# Patient Record
Sex: Female | Born: 1970
Health system: Southern US, Community
[De-identification: ages and names within clinical notes are randomized; demographics above are authoritative.]

## PROBLEM LIST (undated history)

## (undated) DIAGNOSIS — I82409 Acute embolism and thrombosis of unspecified deep veins of unspecified lower extremity: Secondary | ICD-10-CM

## (undated) DIAGNOSIS — K219 Gastro-esophageal reflux disease without esophagitis: Secondary | ICD-10-CM

## (undated) DIAGNOSIS — I739 Peripheral vascular disease, unspecified: Secondary | ICD-10-CM

## (undated) DIAGNOSIS — T783XXA Angioneurotic edema, initial encounter: Secondary | ICD-10-CM

## (undated) DIAGNOSIS — G629 Polyneuropathy, unspecified: Secondary | ICD-10-CM

## (undated) DIAGNOSIS — D649 Anemia, unspecified: Secondary | ICD-10-CM

## (undated) DIAGNOSIS — I2510 Atherosclerotic heart disease of native coronary artery without angina pectoris: Secondary | ICD-10-CM

## (undated) DIAGNOSIS — E119 Type 2 diabetes mellitus without complications: Secondary | ICD-10-CM

## (undated) DIAGNOSIS — I10 Essential (primary) hypertension: Secondary | ICD-10-CM

## (undated) DIAGNOSIS — G43909 Migraine, unspecified, not intractable, without status migrainosus: Secondary | ICD-10-CM

## (undated) DIAGNOSIS — K3184 Gastroparesis: Secondary | ICD-10-CM

## (undated) DIAGNOSIS — R9439 Abnormal result of other cardiovascular function study: Secondary | ICD-10-CM

## (undated) DIAGNOSIS — L732 Hidradenitis suppurativa: Secondary | ICD-10-CM

## (undated) DIAGNOSIS — H269 Unspecified cataract: Secondary | ICD-10-CM

## (undated) DIAGNOSIS — L309 Dermatitis, unspecified: Secondary | ICD-10-CM

## (undated) DIAGNOSIS — N184 Chronic kidney disease, stage 4 (severe): Secondary | ICD-10-CM

## (undated) DIAGNOSIS — L509 Urticaria, unspecified: Secondary | ICD-10-CM

## (undated) HISTORY — PX: ADENOIDECTOMY: SUR15

## (undated) HISTORY — DX: Unspecified cataract: H26.9

## (undated) HISTORY — DX: Dermatitis, unspecified: L30.9

## (undated) HISTORY — DX: Urticaria, unspecified: L50.9

## (undated) HISTORY — PX: ABDOMINOPLASTY: SUR9

## (undated) HISTORY — PX: CORONARY ANGIOPLASTY WITH STENT PLACEMENT: SHX49

## (undated) HISTORY — DX: Angioneurotic edema, initial encounter: T78.3XXA

## (undated) HISTORY — DX: Hidradenitis suppurativa: L73.2

## (undated) HISTORY — PX: TONSILLECTOMY: SUR1361

---

## 1991-01-12 HISTORY — PX: CHOLECYSTECTOMY: SHX55

## 1993-01-11 HISTORY — PX: APPENDECTOMY: SHX54

## 1994-01-11 HISTORY — PX: TUBAL LIGATION: SHX77

## 2000-01-12 HISTORY — PX: REDUCTION MAMMAPLASTY: SUR839

## 2003-09-25 ENCOUNTER — Ambulatory Visit: Payer: Self-pay | Admitting: Family Medicine

## 2003-10-09 ENCOUNTER — Ambulatory Visit: Payer: Self-pay | Admitting: Family Medicine

## 2004-07-23 ENCOUNTER — Emergency Department (HOSPITAL_COMMUNITY): Admission: EM | Admit: 2004-07-23 | Discharge: 2004-07-23 | Payer: Self-pay | Admitting: Family Medicine

## 2004-12-21 ENCOUNTER — Emergency Department (HOSPITAL_COMMUNITY): Admission: EM | Admit: 2004-12-21 | Discharge: 2004-12-21 | Payer: Self-pay | Admitting: Family Medicine

## 2005-04-22 ENCOUNTER — Encounter: Admission: RE | Admit: 2005-04-22 | Discharge: 2005-04-22 | Payer: Self-pay | Admitting: Emergency Medicine

## 2005-07-27 ENCOUNTER — Other Ambulatory Visit: Admission: RE | Admit: 2005-07-27 | Discharge: 2005-07-27 | Payer: Self-pay | Admitting: Obstetrics and Gynecology

## 2005-08-04 ENCOUNTER — Encounter (INDEPENDENT_AMBULATORY_CARE_PROVIDER_SITE_OTHER): Payer: Self-pay | Admitting: Specialist

## 2005-08-04 ENCOUNTER — Ambulatory Visit (HOSPITAL_COMMUNITY): Admission: RE | Admit: 2005-08-04 | Discharge: 2005-08-05 | Payer: Self-pay | Admitting: Obstetrics and Gynecology

## 2005-08-04 HISTORY — PX: VAGINAL HYSTERECTOMY: SUR661

## 2005-08-06 ENCOUNTER — Inpatient Hospital Stay (HOSPITAL_COMMUNITY): Admission: AD | Admit: 2005-08-06 | Discharge: 2005-08-10 | Payer: Self-pay | Admitting: Obstetrics and Gynecology

## 2005-08-13 ENCOUNTER — Inpatient Hospital Stay (HOSPITAL_COMMUNITY): Admission: AD | Admit: 2005-08-13 | Discharge: 2005-08-22 | Payer: Self-pay | Admitting: Obstetrics and Gynecology

## 2005-08-14 ENCOUNTER — Encounter (INDEPENDENT_AMBULATORY_CARE_PROVIDER_SITE_OTHER): Payer: Self-pay | Admitting: Specialist

## 2005-08-14 HISTORY — PX: EXPLORATORY LAPAROTOMY: SUR591

## 2005-08-17 ENCOUNTER — Ambulatory Visit: Payer: Self-pay | Admitting: Infectious Diseases

## 2005-10-06 ENCOUNTER — Ambulatory Visit (HOSPITAL_COMMUNITY): Admission: RE | Admit: 2005-10-06 | Discharge: 2005-10-06 | Payer: Self-pay | Admitting: Gastroenterology

## 2006-07-01 ENCOUNTER — Emergency Department (HOSPITAL_COMMUNITY): Admission: EM | Admit: 2006-07-01 | Discharge: 2006-07-01 | Payer: Self-pay | Admitting: Emergency Medicine

## 2006-07-15 ENCOUNTER — Emergency Department (HOSPITAL_COMMUNITY): Admission: EM | Admit: 2006-07-15 | Discharge: 2006-07-15 | Payer: Self-pay | Admitting: Family Medicine

## 2006-11-03 ENCOUNTER — Emergency Department (HOSPITAL_COMMUNITY): Admission: EM | Admit: 2006-11-03 | Discharge: 2006-11-03 | Payer: Self-pay | Admitting: Emergency Medicine

## 2007-02-20 ENCOUNTER — Encounter: Payer: Self-pay | Admitting: Endocrinology

## 2007-08-03 ENCOUNTER — Encounter: Admission: RE | Admit: 2007-08-03 | Discharge: 2007-08-03 | Payer: Self-pay | Admitting: Emergency Medicine

## 2007-08-27 ENCOUNTER — Emergency Department (HOSPITAL_COMMUNITY): Admission: EM | Admit: 2007-08-27 | Discharge: 2007-08-27 | Payer: Self-pay | Admitting: Family Medicine

## 2007-09-22 ENCOUNTER — Emergency Department (HOSPITAL_COMMUNITY): Admission: EM | Admit: 2007-09-22 | Discharge: 2007-09-22 | Payer: Self-pay | Admitting: Family Medicine

## 2007-11-02 ENCOUNTER — Emergency Department (HOSPITAL_COMMUNITY): Admission: EM | Admit: 2007-11-02 | Discharge: 2007-11-02 | Payer: Self-pay | Admitting: Emergency Medicine

## 2007-11-19 ENCOUNTER — Emergency Department (HOSPITAL_COMMUNITY): Admission: EM | Admit: 2007-11-19 | Discharge: 2007-11-19 | Payer: Self-pay | Admitting: Emergency Medicine

## 2007-12-23 ENCOUNTER — Emergency Department (HOSPITAL_COMMUNITY): Admission: EM | Admit: 2007-12-23 | Discharge: 2007-12-24 | Payer: Self-pay | Admitting: Emergency Medicine

## 2007-12-24 ENCOUNTER — Emergency Department (HOSPITAL_COMMUNITY): Admission: EM | Admit: 2007-12-24 | Discharge: 2007-12-24 | Payer: Self-pay | Admitting: Emergency Medicine

## 2007-12-24 ENCOUNTER — Emergency Department (HOSPITAL_COMMUNITY): Admission: EM | Admit: 2007-12-24 | Discharge: 2007-12-24 | Payer: Self-pay | Admitting: Family Medicine

## 2008-03-09 ENCOUNTER — Emergency Department (HOSPITAL_COMMUNITY): Admission: EM | Admit: 2008-03-09 | Discharge: 2008-03-10 | Payer: Self-pay | Admitting: Emergency Medicine

## 2008-07-26 ENCOUNTER — Emergency Department (HOSPITAL_COMMUNITY): Admission: EM | Admit: 2008-07-26 | Discharge: 2008-07-26 | Payer: Self-pay | Admitting: Family Medicine

## 2008-07-26 ENCOUNTER — Emergency Department (HOSPITAL_COMMUNITY): Admission: EM | Admit: 2008-07-26 | Discharge: 2008-07-26 | Payer: Self-pay | Admitting: Emergency Medicine

## 2008-10-05 ENCOUNTER — Emergency Department (HOSPITAL_COMMUNITY): Admission: EM | Admit: 2008-10-05 | Discharge: 2008-10-06 | Payer: Self-pay | Admitting: Emergency Medicine

## 2008-10-24 ENCOUNTER — Emergency Department (HOSPITAL_COMMUNITY): Admission: EM | Admit: 2008-10-24 | Discharge: 2008-10-24 | Payer: Self-pay | Admitting: Family Medicine

## 2009-01-21 ENCOUNTER — Emergency Department (HOSPITAL_COMMUNITY): Admission: EM | Admit: 2009-01-21 | Discharge: 2009-01-21 | Payer: Self-pay | Admitting: Emergency Medicine

## 2009-01-22 ENCOUNTER — Emergency Department (HOSPITAL_COMMUNITY): Admission: EM | Admit: 2009-01-22 | Discharge: 2009-01-22 | Payer: Self-pay | Admitting: Family Medicine

## 2009-03-14 ENCOUNTER — Emergency Department (HOSPITAL_COMMUNITY): Admission: EM | Admit: 2009-03-14 | Discharge: 2009-03-14 | Payer: Self-pay | Admitting: Emergency Medicine

## 2009-07-09 ENCOUNTER — Emergency Department (HOSPITAL_COMMUNITY): Admission: EM | Admit: 2009-07-09 | Discharge: 2009-07-10 | Payer: Self-pay | Admitting: Emergency Medicine

## 2009-07-22 ENCOUNTER — Emergency Department (HOSPITAL_COMMUNITY): Admission: EM | Admit: 2009-07-22 | Discharge: 2009-07-22 | Payer: Self-pay | Admitting: Family Medicine

## 2009-08-03 ENCOUNTER — Emergency Department (HOSPITAL_COMMUNITY): Admission: EM | Admit: 2009-08-03 | Discharge: 2009-08-03 | Payer: Self-pay | Admitting: Family Medicine

## 2009-08-09 ENCOUNTER — Emergency Department (HOSPITAL_COMMUNITY): Admission: EM | Admit: 2009-08-09 | Discharge: 2009-08-09 | Payer: Self-pay | Admitting: Family Medicine

## 2009-11-04 ENCOUNTER — Emergency Department (HOSPITAL_COMMUNITY): Admission: EM | Admit: 2009-11-04 | Discharge: 2009-11-04 | Payer: Self-pay | Admitting: Emergency Medicine

## 2010-01-01 ENCOUNTER — Encounter (INDEPENDENT_AMBULATORY_CARE_PROVIDER_SITE_OTHER): Payer: Self-pay | Admitting: Family Medicine

## 2010-01-01 LAB — CONVERTED CEMR LAB
ALT: 9 units/L (ref 0–35)
AST: 7 units/L (ref 0–37)
Alkaline Phosphatase: 129 units/L — ABNORMAL HIGH (ref 39–117)
BUN: 15 mg/dL (ref 6–23)
CO2: 24 meq/L (ref 19–32)
Calcium: 9.4 mg/dL (ref 8.4–10.5)
Chloride: 100 meq/L (ref 96–112)
Creatinine, Ser: 0.67 mg/dL (ref 0.40–1.20)
Glucose, Bld: 418 mg/dL — ABNORMAL HIGH (ref 70–99)
HCT: 39.2 % (ref 36.0–46.0)
HDL: 33 mg/dL — ABNORMAL LOW (ref >39)
RBC: 4.95 M/uL (ref 3.87–5.11)
RDW: 13.4 % (ref 11.5–15.5)
Total Bilirubin: 0.3 mg/dL (ref 0.3–1.2)
Total CHOL/HDL Ratio: 7.2
Triglycerides: 149 mg/dL (ref <150)

## 2010-01-09 ENCOUNTER — Emergency Department (HOSPITAL_COMMUNITY)
Admission: EM | Admit: 2010-01-09 | Discharge: 2010-01-09 | Payer: Self-pay | Source: Home / Self Care | Admitting: Emergency Medicine

## 2010-02-12 ENCOUNTER — Inpatient Hospital Stay (INDEPENDENT_AMBULATORY_CARE_PROVIDER_SITE_OTHER)
Admission: RE | Admit: 2010-02-12 | Discharge: 2010-02-12 | Disposition: A | Discharge status: Home / Self Care | Payer: Self-pay | Source: Ambulatory Visit | Attending: Family Medicine | Admitting: Family Medicine

## 2010-02-12 DIAGNOSIS — L0591 Pilonidal cyst without abscess: Secondary | ICD-10-CM

## 2010-03-06 ENCOUNTER — Emergency Department (HOSPITAL_COMMUNITY)
Admission: EM | Admit: 2010-03-06 | Discharge: 2010-03-07 | Disposition: A | Discharge status: Home / Self Care | Payer: Self-pay | Attending: Emergency Medicine | Admitting: Emergency Medicine

## 2010-03-06 DIAGNOSIS — K2970 Gastritis, unspecified, without bleeding: Secondary | ICD-10-CM | POA: Insufficient documentation

## 2010-03-06 DIAGNOSIS — E785 Hyperlipidemia, unspecified: Secondary | ICD-10-CM | POA: Insufficient documentation

## 2010-03-06 DIAGNOSIS — F17200 Nicotine dependence, unspecified, uncomplicated: Secondary | ICD-10-CM | POA: Insufficient documentation

## 2010-03-06 DIAGNOSIS — Z880 Allergy status to penicillin: Secondary | ICD-10-CM | POA: Insufficient documentation

## 2010-03-06 DIAGNOSIS — Z8249 Family history of ischemic heart disease and other diseases of the circulatory system: Secondary | ICD-10-CM | POA: Insufficient documentation

## 2010-03-06 DIAGNOSIS — E119 Type 2 diabetes mellitus without complications: Secondary | ICD-10-CM | POA: Insufficient documentation

## 2010-03-06 DIAGNOSIS — Z794 Long term (current) use of insulin: Secondary | ICD-10-CM | POA: Insufficient documentation

## 2010-03-06 DIAGNOSIS — Z79899 Other long term (current) drug therapy: Secondary | ICD-10-CM | POA: Insufficient documentation

## 2010-03-06 LAB — DIFFERENTIAL
Basophils Absolute: 0 10*3/uL (ref 0.0–0.1)
Basophils Relative: 0 % (ref 0–1)
Lymphocytes Relative: 42 % (ref 12–46)
Monocytes Absolute: 0.3 10*3/uL (ref 0.1–1.0)
Monocytes Relative: 4 % (ref 3–12)
Neutro Abs: 5.2 10*3/uL (ref 1.7–7.7)

## 2010-03-06 LAB — COMPREHENSIVE METABOLIC PANEL
ALT: 25 U/L (ref 0–35)
AST: 15 U/L (ref 0–37)
Albumin: 3.5 g/dL (ref 3.5–5.2)
GFR calc non Af Amer: >60 mL/min (ref >60)
Total Bilirubin: 0.3 mg/dL (ref 0.3–1.2)
Total Protein: 8 g/dL (ref 6.0–8.3)

## 2010-03-06 LAB — CBC
Hemoglobin: 12.8 g/dL (ref 12.0–15.0)
MCH: 25.8 pg — ABNORMAL LOW (ref 26.0–34.0)
MCHC: 31.9 g/dL (ref 30.0–36.0)
Platelets: 387 10*3/uL (ref 150–400)
RBC: 4.96 MIL/uL (ref 3.87–5.11)
RDW: 13.8 % (ref 11.5–15.5)

## 2010-03-06 LAB — POCT PREGNANCY, URINE: Preg Test, Ur: NEGATIVE

## 2010-03-23 LAB — CBC
MCH: 26.6 pg (ref 26.0–34.0)
Platelets: 291 10*3/uL (ref 150–400)
RBC: 4.82 MIL/uL (ref 3.87–5.11)

## 2010-03-23 LAB — URINE MICROSCOPIC-ADD ON

## 2010-03-23 LAB — BASIC METABOLIC PANEL
CO2: 26 mEq/L (ref 19–32)
Calcium: 9.1 mg/dL (ref 8.4–10.5)
Creatinine, Ser: 0.65 mg/dL (ref 0.4–1.2)
GFR calc Af Amer: >60 mL/min (ref >60)

## 2010-03-23 LAB — GLUCOSE, CAPILLARY
Glucose-Capillary: 247 mg/dL — ABNORMAL HIGH (ref 70–99)
Glucose-Capillary: 375 mg/dL — ABNORMAL HIGH (ref 70–99)

## 2010-03-23 LAB — URINALYSIS, ROUTINE W REFLEX MICROSCOPIC
Bilirubin Urine: NEGATIVE
Ketones, ur: NEGATIVE mg/dL
Leukocytes, UA: NEGATIVE
Protein, ur: NEGATIVE mg/dL
Specific Gravity, Urine: 1.044 — ABNORMAL HIGH (ref 1.005–1.030)

## 2010-03-23 LAB — URINE CULTURE
Colony Count: 10000
Culture  Setup Time: 201112301112

## 2010-03-23 LAB — DIFFERENTIAL
Basophils Absolute: 0 10*3/uL (ref 0.0–0.1)
Basophils Relative: 0 % (ref 0–1)
Eosinophils Absolute: 0.1 10*3/uL (ref 0.0–0.7)
Lymphs Abs: 3.3 10*3/uL (ref 0.7–4.0)
Neutrophils Relative %: 52 % (ref 43–77)

## 2010-03-28 LAB — CULTURE, ROUTINE-ABSCESS

## 2010-03-29 LAB — POCT I-STAT, CHEM 8
BUN: 14 mg/dL (ref 6–23)
Calcium, Ion: 1.23 mmol/L (ref 1.12–1.32)
Calcium, Ion: 1.16 mmol/L (ref 1.12–1.32)
Chloride: 99 mEq/L (ref 96–112)
Glucose, Bld: 504 mg/dL — ABNORMAL HIGH (ref 70–99)
Glucose, Bld: 448 mg/dL — ABNORMAL HIGH (ref 70–99)
HCT: 47 % — ABNORMAL HIGH (ref 36.0–46.0)
Hemoglobin: 16 g/dL — ABNORMAL HIGH (ref 12.0–15.0)
Potassium: 4.1 mEq/L (ref 3.5–5.1)
TCO2: 24 mmol/L (ref 0–100)

## 2010-03-29 LAB — GLUCOSE, CAPILLARY
Glucose-Capillary: 332 mg/dL — ABNORMAL HIGH (ref 70–99)
Glucose-Capillary: 356 mg/dL — ABNORMAL HIGH (ref 70–99)

## 2010-03-29 LAB — POCT URINALYSIS DIP (DEVICE)
Protein, ur: NEGATIVE mg/dL
Urobilinogen, UA: 0.2 mg/dL (ref 0.0–1.0)
pH: 5 (ref 5.0–8.0)

## 2010-04-05 LAB — CULTURE, ROUTINE-ABSCESS

## 2010-04-06 ENCOUNTER — Other Ambulatory Visit (HOSPITAL_COMMUNITY): Payer: Self-pay | Admitting: Family Medicine

## 2010-04-06 DIAGNOSIS — Z1231 Encounter for screening mammogram for malignant neoplasm of breast: Secondary | ICD-10-CM

## 2010-04-10 ENCOUNTER — Ambulatory Visit (HOSPITAL_COMMUNITY)
Admission: RE | Admit: 2010-04-10 | Discharge: 2010-04-10 | Disposition: A | Discharge status: Home / Self Care | Payer: Self-pay | Source: Ambulatory Visit | Attending: Family Medicine | Admitting: Family Medicine

## 2010-04-10 DIAGNOSIS — Z1231 Encounter for screening mammogram for malignant neoplasm of breast: Secondary | ICD-10-CM | POA: Insufficient documentation

## 2010-04-16 LAB — GC/CHLAMYDIA PROBE AMP, GENITAL: Chlamydia, DNA Probe: NEGATIVE

## 2010-04-16 LAB — WET PREP, GENITAL: WBC, Wet Prep HPF POC: NONE SEEN

## 2010-04-17 LAB — BASIC METABOLIC PANEL
BUN: 11 mg/dL (ref 6–23)
Calcium: 9.4 mg/dL (ref 8.4–10.5)
Chloride: 97 mEq/L (ref 96–112)
Creatinine, Ser: 0.58 mg/dL (ref 0.4–1.2)
GFR calc Af Amer: >60 mL/min (ref >60)
GFR calc non Af Amer: >60 mL/min (ref >60)

## 2010-04-17 LAB — URINALYSIS, ROUTINE W REFLEX MICROSCOPIC
Glucose, UA: >1000 mg/dL — AB
Hgb urine dipstick: NEGATIVE
Leukocytes, UA: NEGATIVE
Protein, ur: NEGATIVE mg/dL
Specific Gravity, Urine: 1.031 — ABNORMAL HIGH (ref 1.005–1.030)
pH: 6 (ref 5.0–8.0)

## 2010-04-17 LAB — URINE MICROSCOPIC-ADD ON

## 2010-04-17 LAB — DIFFERENTIAL
Eosinophils Absolute: 0.1 10*3/uL (ref 0.0–0.7)
Lymphocytes Relative: 27 % (ref 12–46)
Lymphs Abs: 3.4 10*3/uL (ref 0.7–4.0)
Monocytes Relative: 5 % (ref 3–12)
Neutro Abs: 8.3 10*3/uL — ABNORMAL HIGH (ref 1.7–7.7)
Neutrophils Relative %: 67 % (ref 43–77)

## 2010-04-17 LAB — GLUCOSE, CAPILLARY
Glucose-Capillary: 313 mg/dL — ABNORMAL HIGH (ref 70–99)
Glucose-Capillary: 422 mg/dL — ABNORMAL HIGH (ref 70–99)

## 2010-04-17 LAB — CBC
MCV: 80.6 fL (ref 78.0–100.0)
Platelets: 301 10*3/uL (ref 150–400)
RBC: 4.98 MIL/uL (ref 3.87–5.11)
WBC: 12.5 10*3/uL — ABNORMAL HIGH (ref 4.0–10.5)

## 2010-04-17 LAB — PREGNANCY, URINE: Preg Test, Ur: NEGATIVE

## 2010-04-19 LAB — WET PREP, GENITAL: Trich, Wet Prep: NONE SEEN

## 2010-04-19 LAB — POCT URINALYSIS DIP (DEVICE)
Ketones, ur: NEGATIVE mg/dL
Protein, ur: NEGATIVE mg/dL
Specific Gravity, Urine: 1.015 (ref 1.005–1.030)
pH: 5.5 (ref 5.0–8.0)

## 2010-04-20 ENCOUNTER — Emergency Department (HOSPITAL_COMMUNITY)
Admission: EM | Admit: 2010-04-20 | Discharge: 2010-04-21 | Disposition: A | Discharge status: Home / Self Care | Payer: Self-pay | Attending: Emergency Medicine | Admitting: Emergency Medicine

## 2010-04-20 DIAGNOSIS — Z950 Presence of cardiac pacemaker: Secondary | ICD-10-CM | POA: Insufficient documentation

## 2010-04-20 DIAGNOSIS — R079 Chest pain, unspecified: Secondary | ICD-10-CM | POA: Insufficient documentation

## 2010-04-20 DIAGNOSIS — R109 Unspecified abdominal pain: Secondary | ICD-10-CM | POA: Insufficient documentation

## 2010-04-20 DIAGNOSIS — I10 Essential (primary) hypertension: Secondary | ICD-10-CM | POA: Insufficient documentation

## 2010-04-28 LAB — CBC
HCT: 42.7 % (ref 36.0–46.0)
Platelets: 291 10*3/uL (ref 150–400)
RDW: 13.4 % (ref 11.5–15.5)

## 2010-04-28 LAB — URINALYSIS, ROUTINE W REFLEX MICROSCOPIC
Ketones, ur: NEGATIVE mg/dL
Leukocytes, UA: NEGATIVE
Nitrite: POSITIVE — AB
Protein, ur: NEGATIVE mg/dL
Urobilinogen, UA: 1 mg/dL (ref 0.0–1.0)
pH: 6.5 (ref 5.0–8.0)

## 2010-04-28 LAB — POCT I-STAT, CHEM 8
BUN: 12 mg/dL (ref 6–23)
Calcium, Ion: 1.22 mmol/L (ref 1.12–1.32)
Chloride: 98 mEq/L (ref 96–112)
Creatinine, Ser: 0.6 mg/dL (ref 0.4–1.2)
Glucose, Bld: 435 mg/dL — ABNORMAL HIGH (ref 70–99)

## 2010-04-28 LAB — DIFFERENTIAL
Basophils Absolute: 0.1 10*3/uL (ref 0.0–0.1)
Basophils Relative: 1 % (ref 0–1)
Eosinophils Relative: 1 % (ref 0–5)
Lymphocytes Relative: 29 % (ref 12–46)

## 2010-04-28 LAB — URINE CULTURE: Colony Count: >=100000

## 2010-04-28 LAB — URINE MICROSCOPIC-ADD ON

## 2010-04-28 LAB — GC/CHLAMYDIA PROBE AMP, GENITAL
Chlamydia, DNA Probe: NEGATIVE
GC Probe Amp, Genital: NEGATIVE

## 2010-04-28 LAB — KETONES, QUALITATIVE: Acetone, Bld: NEGATIVE

## 2010-04-28 LAB — GLUCOSE, CAPILLARY
Glucose-Capillary: 127 mg/dL — ABNORMAL HIGH (ref 70–99)
Glucose-Capillary: 398 mg/dL — ABNORMAL HIGH (ref 70–99)

## 2010-05-29 NOTE — Discharge Summary (Signed)
NAME:  Susan Horton, ENGEBRETSON NO.:  [id number]   MEDICAL RECORD NO.:  [id number]          PATIENT TYPE:  INP   LOCATION:  [id number]                          FACILITY:  [facility]   PHYSICIAN:  Eli Hose, M.D.DATE OF BIRTH:  1970-05-14   DATE OF ADMISSION:  08/06/2005  DATE OF DISCHARGE:  08/10/2005                                 DISCHARGE SUMMARY   DISCHARGE DIAGNOSES:  1. Ileus.  2. Status post vaginal hysterectomy.   PROCEDURES:  On hospital day #1 and #3, the patient underwent an acute  abdominal series which showed progressive small bowel distension with  multiple air-fluid levels.  The patient was thought to perhaps have a focal  ileus or small-bowel obstruction.  There was no evidence of free  intraperitoneal air and no acute chest findings.   HISTORY OF PRESENT ILLNESS:  Ms. Susan Horton is a 40 year old female, P4-0-0-4 who  was 2 days status post from a vaginal hysterectomy who presented with  increased abdominal bloating pain, constipation and vomiting.  Please see  the patient's dictated history and physical examination for details.   PHYSICAL EXAMINATION:  VITAL SIGNS:  Blood pressure was 121/86, temperature  was 98.6 degrees Fahrenheit orally.  GENERAL:  Within normal limits.  ABDOMEN:  Do note though the patient's abdominal exam revealed a distended  abdomen which was soft without rebound or guarding.  PELVIC:  The patient's pelvic exam was deferred.   HOSPITAL COURSE:  On the day of admission, the patient was given a Fleet's  enema switched to which she had a good response with relief of her symptoms  for approximately 24 hours at which time she resumed a sensation of  fullness, could no longer passed flatus and had not had a bowel movement.  The patient was then given Dulcolax suppositories along with MiraLax and a  general surgery consult was obtained.  It was felt by the consultant that  the patient had an ileus which should resolve with enemas within 24  hours.  The patient was again given an enema to which she responded by the passage  of yet another large amount of stool.  It was at this time that the  patient's symptoms resolved and remained so until her time of discharge.  The patient was able to tolerate a regular diet by hospital day #5, and was  therefore deemed ready for discharge home.  The patient's discharge complete  blood count was within normal limits except the patient's hemoglobin was  10.5 and hematocrit 31.6.  The patient's comprehensive metabolic panel at  time of discharge was also within normal limits with the exception of her  albumin being 2.8.   DISCHARGE MEDICATIONS:  1. Dulcolax suppositories as needed.  2. Prune juice daily.  3. MiraLax 17 g with 4-8 ounce glasses of liquid daily.  4. Ciprofloxacin as written on prescription.  5. Diflucan 150 mg one p.o. stat.   FOLLOW UP:  The patient is scheduled for a follow-up visit with Dr. Mancel Bale  on August 16, 2005, at 9 a.m.   DISCHARGE INSTRUCTIONS:  The patient was  advised to call doctor's office  with a temperature greater than or equal to 100.4 degrees Fahrenheit, severe  abdominal pain, constipation, nausea or vomiting.  The patient was further  advised to resume her previous discharge instructions regarding activity and  diet.      Susan Horton.      Eli Hose, M.D.  Electronically Signed    EJP/MEDQ  D:  10/08/2005  T:  10/11/2005  Job:  [id number]

## 2010-05-29 NOTE — Op Note (Signed)
NAME:  Susan Horton, Susan Horton NO.:  [id number]   MEDICAL RECORD NO.:  [id number]          PATIENT TYPE:  OIB   LOCATION:  9317                          FACILITY:  [facility]   PHYSICIAN:  Everett Graff, M.D.   DATE OF BIRTH:  07-31-1970   DATE OF PROCEDURE:  08/04/2005  DATE OF DISCHARGE:                                 OPERATIVE REPORT   PREOPERATIVE DIAGNOSES:  1.  Menorrhagia.  2.  Dysmenorrhea.  3.  Insulin dependent diabetic.  4.  Questionable adenomyosis.   POSTOPERATIVE DIAGNOSES:  1.  Menorrhagia.  2.  Dysmenorrhea.  3.  Insulin dependent diabetic.   PROCEDURE:  Total vaginal hysterectomy and cystoscopy.   ATTENDING:  Dr. Everett Graff.   ASSISTANT:  [name].   ANESTHESIA:  General.   SPECIMENS TO PATHOLOGY:  Uterus and cervix.   ESTIMATED BLOOD LOSS:  300 mL.   URINE OUTPUT:  400 mL.   FLUIDS:  2500 mL.   COMPLICATIONS:  None.   FINDINGS:  Normal right ovary, however left ovary unable to be visualized  clearly.   PROCEDURE:  The patient was taken to the operating room, after the risks,  benefits and alternatives were reviewed with the patient.  The patient  verbalized understanding, consent signed and witnessed.  The patient was  placed under general anesthesia, and prepped and draped in the normal  sterile fashion, in the dorsal lithotomy position.  A weighted speculum was  placed in the patient's vagina, and vaginal wall retractors placed as well.  The cervix was circumscribed with the Bovie, and the posterior cul-de-sac  entered without difficulty.  The uterosacrals were bilaterally clamped with  curved Heaney, cut and suture ligated using zero Vicryl.  The bladder was  dissected away and anterior cul-de-sac entered.  The paracervical and  parametrial tissue was then clamped with a curved Heaney, cut and suture  ligated using zero Vicryl, incorporating the cardinal ligament and uterine  vessels.  This was done in a sequential  fashion bilaterally.  The fundus was  exteriorized, using a towel clip, and the utero-ovarian pedicle was clamped  with a large Kelly bilaterally, cut, ligated with 0-0 Vicryl ties x2, and  suture ligated.  This was done on both pedicles.  There was some bleeding  noted on the right which was made hemostatic with 2-0 Vicryl stitches.  The  attention was then turned to the bladder, and the Foley was removed, and  cystoscopy performed, after indigo carmine administered, and bilateral  ureters noted to efflux without difficulty.  The vaginal angles were then  stitched using zero Vicryl fixation stitches, incorporating the sacral  ligament, after replacing the Foley to gravity.  The remainder of the cuff  was closed with zero Vicryl via figure-of-eight stitches.  Good hemostasis  was noted.  Sponge, lap and needle count was correct.  The patient was  returned to recovery room in good condition.      Everett Graff, M.D.  Electronically Signed     AR/MEDQ  D:  08/04/2005  T:  08/04/2005  Job:  [id number]

## 2010-05-29 NOTE — Discharge Summary (Signed)
NAME:  Susan Horton, Susan Horton NO.:  [id number]   MEDICAL RECORD NO.:  [id number]          PATIENT TYPE:  OIB   LOCATION:  [id number]                          FACILITY[facility]   PHYSICIAN:  Everett Graff, M.D.   DATE OF BIRTH:  [birth year]   DATE OF ADMISSION:  08/04/2005  DATE OF DISCHARGE:  08/05/2005                                 DISCHARGE SUMMARY   DISCHARGE DIAGNOSIS:  Menorrhagia, dysmenorrhea.   OPERATION:  On the day of admission the patient underwent a total vaginal  hysterectomy with cystoscopy tolerating procedures well.   HISTORY OF PRESENT ILLNESS:  Susan Horton is a 40 year old single African  American female who is status post bilateral tubal ligation para 4-0-0-4 who  presents for a vaginal hysterectomy because of menorrhagia and dysmenorrhea.  Please see the patient's dictated history and physical examination for  details.   PREOPERATIVE PHYSICAL EXAM:  Blood pressure 110/70, weight is 181, height is  5 feet 6-1/4 inches tall.  GENERAL: Within normal limits.  PELVIC EXAM: EGBUS was within normal limits.  Vagina is normal.  Cervix is  nontender without lesions.  The uterus is normal size, shape and consistency  without tenderness.  Adnexa without tenderness or masses.   HOSPITAL COURSE:  On the day of admission the patient underwent  aforementioned procedures tolerating them all well.  Postoperative course  was unremarkable with the patient's postop hemoglobin being 11.1 (preop  hemoglobin 12.9).  By postop day #1 the patient had resumed bowel and  bladder function and was therefore deemed ready for discharge home.   DISCHARGE MEDICATIONS:  Percocet 5/325 1-2 tablets every 4 hours as needed  for pain.  Ibuprofen 600 mg with food every 6 hours for 3 days then as  needed for pain, Colace 100 mg twice daily until bowel movements are  regular.  The patient was also referred to her home medication  reconciliation sheet.   FOLLOW UP:  The patient has a 6 weeks  postoperative visit with Dr. Mancel Bale  on August 30 of 2007 at 2:30 p.m.   DISCHARGE INSTRUCTIONS:  The patient was given a copy of [facility] postoperative instruction sheet.  She was further advised to avoid  driving for 2 weeks, heavy lifting for 4 weeks, intercourse for 6 weeks,  that she should increase her activities slowly, that she may shower and walk  up steps.  The patient's diet is restricted to a diabetic diet.  Final  pathology is not available in the time of this dictation.      [name].      Everett Graff, M.D.  Electronically Signed    EJP/MEDQ  D:  09/25/2005  T:  09/27/2005  Job:  [id number]

## 2010-05-29 NOTE — H&P (Signed)
NAME:  Susan Horton, Susan Horton NO.:  [id number]   MEDICAL RECORD NO.:  [id number]          PATIENT TYPE:  MAT   LOCATION:  MATC                          FACILITY:  [facility]   PHYSICIAN:  Eli Hose, M.D.DATE OF BIRTH:  Jan 25, 1970   DATE OF ADMISSION:  08/06/2005  DATE OF DISCHARGE:                                HISTORY & PHYSICAL   This is a 40 year old, gravida 4 para 4, who is 2 days post-op from a  vaginal hysterectomy, who presents with increased abdominal bloating and  pain with also having vomiting and constipation.  She had a vaginal  hysterectomy on August 04, 2005, and has not had a bowel movement since.  She  had a Dulcolax suppository yesterday and magnesium citrate today with no  results and reports increased bloating and pain.   ALLERGIES:  PENICILLIN.   OBSTETRICAL HISTORY:  Vaginal delivery x4.   MEDICAL HISTORY:  Insulin-dependent diabetes.   SURGICAL HISTORY:  1.  Breast reduction.  2.  Tonsillectomy.  3.  Appendectomy.  4.  Recent vaginal hysterectomy.   SOCIAL HISTORY:  Family is supportive.  She does smoke 1/2 a pack per day of  cigarettes but denies alcohol or drug abuse.   FAMILY HISTORY:  Noncontributory.   GENETIC HISTORY:  Noncontributory.   REVIEW OF SYSTEMS:  The patient complains of abdominal bloating and pain.   OBJECTIVE DATA:  VITAL SIGNS:  Stable 98.6, blood pressure 121/86.  HEENT:  Within normal limits.  Thyroid not enlarged.  CHEST:  Clear to auscultation.  HEART:  Regular rate and rhythm.  ABDOMEN:  Distended but soft with no rebound or guarding.  PELVIC:  Deferred.  EXTREMITIES:  Within normal limits.   LABORATORY VALUES:  White blood cell count 11.8, hemoglobin 12.6, platelets  392.  Chemistries within normal limits except for a glucose of 140.  AST is  12, ALT is 16.  Bilirubin is 0.9, creatinine is 0.7.  KUB shows a mild  ileus.   ASSESSMENT:  1.  Status post 2 days ago vaginal hysterectomy.  2.  Mild  ileus.   PLAN:  1.  Admit to third floor per Dr. Raphael Gibney.  2.  IV fluids.  3.  Enema.  4.  Further orders to follow.      Marie L. Williams, C.N.M.      Eli Hose, M.D.  Electronically Signed    MLW/MEDQ  D:  08/06/2005  T:  08/06/2005  Job:  [id number]

## 2010-05-29 NOTE — Consult Note (Signed)
NAME:  Susan Horton, Susan Horton NO.:  [id number]   MEDICAL RECORD NO.:  [id number]          PATIENT TYPE:  INP   LOCATION:  9319                          FACILITY:  [facility]   PHYSICIAN:  Lear Ng, MDDATE OF BIRTH:  1970-09-27   DATE OF CONSULTATION:  08/17/2005  DATE OF DISCHARGE:                                   CONSULTATION   INDICATIONS FOR PROCEDURE:  Vomiting, small bowel obstruction.   HISTORY OF PRESENT ILLNESS:  Susan Horton is a 40 year old black female who had  a hysterectomy on August 04, 2005, and was discharged home and was readmitted  secondary to nausea, vomiting, and abdominal pain postoperatively.  During  her readmission, she had a CAT scan done which showed a pelvic abscess and  small bowel obstruction.  She had surgery on August 14, 2005, in the form of  exploratory laparotomy, lysis of adhesions, and drainage of tubal ovarian  abscess.  Postoperatively, she has been managed with broad spectrum  antibiotics.  She has poorly controlled diabetes and reportedly has had  chronic episodes of nausea with eating.  Today, she had an episode of  vomiting bilious colored fluid and passed a large amount of flatus this  afternoon, as well.  On evaluation, she said her abdominal pain has improved  since passing gas and has also improved somewhat since her vomiting episode.  Abdominal tenderness has been periumbilical.   PAST MEDICAL HISTORY:  Diabetes mellitus, asthma, hyperlipidemia, obesity,  depression, status post cholecystectomy, status post appendectomy, status  post bilateral tubal ligation, status post ventral hernia repair, status  post tonsillectomy.   MEDICATIONS:  Primaxin, Reglan 10 mg IV q.8h., Dilaudid p.r.n., heparin 5000  units subcu q.12h.   ALLERGIES:  PENICILLIN.   PHYSICAL EXAMINATION:  VITAL SIGNS:  Temperature 98.1, pulse 76, blood pressure 116/82.  GENERAL:  Alert in no acute distress.  HEENT:  Nonicteric sclerae.  CHEST:  Clear to  auscultation anteriorly.  CARDIOVASCULAR:  Regular rate and rhythm.  ABDOMEN:  Diffusely tender with guarding, no rebound, positive bowel sounds.   LABORATORY DATA:  White blood count 11.6 down from 20.2, hemoglobin 7.8 down  from 9.4, platelet count 494.  BUN 3, creatinine 0.6.  Potassium 3.1.   IMPRESSION:  40 year old black female with postoperative nausea and vomiting  and worsening abdominal pain with a CAT scan done preoperatively showing  small bowel obstruction and pelvic abscess.  My main concern would be for  inflammatory ileus that may be resolving and I doubt she has a complete  small bowel obstruction.  I recommend repeat abdominal x-rays today to look  for air fluid levels and if these are present, we need to place NG tube.  Otherwise, would do clear liquids as tolerated.  These recommendations were  discussed with Dr. Mancel Bale.  We  will change her Reglan to 10 mg IV q.6h.  I see no indication to do a  gastric emptying study at this time, although as an outpatient this may be  beneficial.  Although she is at risk for gastroparesis, I do not think this  is the primary source of her current symptoms.  We will follow along with  you.  Thank you for this consult.      Lear Ng, MD  Electronically Signed     VCS/MEDQ  D:  08/17/2005  T:  08/17/2005  Job:  [id number]   cc:   Everett Graff, M.D.  Fax: [phone]

## 2010-05-29 NOTE — Consult Note (Signed)
NAME:  Susan Horton, Susan Horton NO.:  [id number]   MEDICAL RECORD NO.:  [id number]          PATIENT TYPE:  INP   LOCATION:  [id number]                          FACILITY:  [facility]   PHYSICIAN:  Jonne Ply, MD   DATE OF BIRTH:  [birth year]   DATE OF CONSULTATION:  08/09/2005  DATE OF DISCHARGE:                                   CONSULTATION   HISTORY OF PRESENT ILLNESS:  A 40 year old patient who is now six status  post vaginal hysterectomy, who presented to the hospital with increased  abdominal bloating, pain and obstipation.  She has since had a significant  amount of flatulence but not any bowel movement.  She did have enema  yesterday.  X-rays show increasing ileus versus small bowel obstruction.   PAST MEDICAL HISTORY:  Significant for appendectomy, umbilical hernia repair  and recent vaginal hysterectomy, tonsillectomy and breast reduction.  Medical history is significant for insulin dependent diabetes mellitus.   PHYSICAL EXAMINATION:  GENERAL:  She is an age appropriate black female  lying in the bed in no distress.  VITAL SIGNS:  Temperature 97.5, blood pressure is 127/70.  HEENT:  Benign, normocephalic, atraumatic.  Pupils equal, round and reactive  to light.  NECK:  Supple and soft without thyromegaly or cervical adenopathy.  LUNGS:  Clear to auscultation and percussion x 2.  HEART:  Regular rate and rhythm without murmurs, rubs or gallops.  ABDOMEN:  Minimally distended with normal bowel sounds and minimally tender.  EXTREMITIES:  No cyanosis, clubbing or edema.   IMPRESSION:  Probable ileus, status post vaginal hysterectomy.  No evidence  of bowel obstruction except by x-ray.  The patient is not having any nausea  or vomiting.  I would recommend a Fleet's enema today.  If she does not  improve, maybe nasogastric tube suction.  The patient is clinically  improving and I think that she will probably improve over the next 24 hours.      Jonne Ply, MD  Electronically Signed     KRE/MEDQ  D:  08/09/2005  T:  08/09/2005  Job:  [id number]

## 2010-05-29 NOTE — Discharge Summary (Signed)
NAME:  Susan Horton, Susan Horton NO.:  [id number]   MEDICAL RECORD NO.:  [id number]          PATIENT TYPE:  INP   LOCATION:  9319                          FACILITY:  [facility]   PHYSICIAN:  Everett Graff, M.D.   DATE OF BIRTH:  08/26/1970   DATE OF ADMISSION:  08/13/2005  DATE OF DISCHARGE:  08/22/2005                                 DISCHARGE SUMMARY   DISCHARGE DIAGNOSES:  1. Abdominal pain, nausea and vomiting.  2. Partial small bowel obstruction.  3. Pelvic abscess.  4. Pelvic adhesions.  5. Status post total vaginal hysterectomy.   OPERATIONS:  On hospital day #2, the patient underwent a diagnostic  laparoscopy followed by a laparotomy with lysis of adhesions, removal of  pelvic abscess, excision of a portion of her right adnexa and bowel  inspection by general surgeon, tolerating procedures well.  The patient was  found to have in her right lower quadrant an abscess.  She was also found to  have multiple adhesions of small bowel to the right lower quadrant abscess.   HISTORY OF PRESENT ILLNESS:  Ms. Godin is a 40 year old female, para 4-0-0-  4, who is status post total vaginal hysterectomy (August 04, 2005) secondary  to menorrhagia and dysmenorrhea.  Since the patient's surgery she has had  intermittent episodes of nausea, vomiting and abdominal pain requiring one  previous hospital admission on July 30, 2005, for 5 days, at which time she  was suffering from an ileus.  The patient found relief during that admission  from repeated enemas and was therefore discharged home.  The patient  presented at this admission with complaints of nausea, vomiting and  abdominal pain.  Please see the patient's dictated history and physical  examination for details.   ADMISSION PHYSICAL EXAM:  VITAL SIGNS:  Temperature 99.3 degrees Fahrenheit  orally, pulse 160, respiration 22, blood pressure 123/78.  LUNGS:  Clear to auscultation bilaterally.  CARDIAC:  Regular rate and rhythm.  ABDOMEN:  Soft, distended, with decreased bowel sounds.  There was positive  tenderness diffusely with voluntary guarding but no rebound.  GENITOURINARY:  Exam via speculum:  No obvious disruption of her vaginal  cuff, however a small area of disruption would be difficult to visualize on  a speculum exam.  There was positive tenderness on the right side of the  vaginal cuff with a mass that was also palpable on that side.   HOSPITAL COURSE:  On the date of admission the patient was found to have an  elevated white count at 18,700, hemoglobin 11.8, hematocrit 36, and platelet  552.  However, there was no left shift.  The patient was started on a  combination of ciprofloxacin and Flagyl antibiotics and given pain  medications.  A CT of the pelvis revealed findings consistent with a pelvic  abscess, probably associated with vaginal cuff disruption, along with small  bowel obstruction.  The patient also received a bilateral lower extremity  Doppler study; however, she was not found to have any evidence of deep vein  thrombosis, superficial thrombosis or Baker's cyst.  The patient  received a  general surgery consult and on hospital day #2 she was taken to the OR for  previously-stated surgical procedure.  The patient's postoperative course  was marked by the patient receiving an infectious disease and GI consult, at  which time her antibiotics were changed to vancomycin and Primaxin.  This  was as a result of the patient's abscess growing out rare gram-positive  cocci with a few Enterococcus.  Per the patient's GI consult, it was  believed that some of her slow bowel function was due to gastroparesis in  association with her uncontrolled insulin-dependent diabetes.  The patient  slowly began to improve, however, and though she did have intermittent  vomiting throughout her hospital stay, she at no time spiked a fever.  By  hospital day #7 she had begun to feel better, pass flatus, and no  longer  experienced nausea or vomiting.  The patient was able to tolerate a regular  diet and on hospital stay day #10 (postop day #9), she was ready for  discharge home.   Discharge CBC:  White blood count 10.1, hemoglobin 8.1, hematocrit 24.8,  platelets 463.  Hemoglobin A1c 8.3.  TSH 3.300.  her basic metabolic panel  was within normal limits with exception of her calcium being 8.1 and BUN  being 3.   DISCHARGE MEDICATIONS:  1. Reglan 10 mg every 6 hours.  2. Augmentin 875 mg as prescribed.  3. Motrin 600 mg every 6 hours with food for pain.   FOLLOW-UP:  The patient is to call to [phone] at [facility] to  schedule a follow-up before the week's and with Dr. Mancel Bale.  The patient is  also to call and arrange for a follow-up visit with general surgeon Dr.  Bubba Camp and gastroenterologist Dr. Oletta Lamas or Dr. Michail Sermon.   DISCHARGE INSTRUCTIONS:  The patient was advised to call with a temperature  greater than or equal to 100.4 degrees Fahrenheit, severe abdominal pain,  vomiting or any problems.  The patient was also given a copy of [facility] postoperative instruction sheet.  She was further advised to  avoid driving for 1 week, heavy lifting for 4 weeks, intercourse for 6  weeks, that she may walk up steps, may shower, should increase her  activities slowly.  The patient's diet should be that of a diabetic diet.   FINAL PATHOLOGY:  Right ovary resection:  Variable acute inflammation with  associated fibrous adhesions, follicular cyst, no evidence of malignancy.      [name].      Everett Graff, M.D.  Electronically Signed    EJP/MEDQ  D:  10/08/2005  T:  10/11/2005  Job:  [id number]

## 2010-05-29 NOTE — Consult Note (Signed)
NAME:  Susan Horton, Susan Horton NO.:  [id number]   MEDICAL RECORD NO.:  [id number]          PATIENT TYPE:  INP   LOCATION:  9319                          FACILITY:  [facility]   PHYSICIAN:  Sammuel Hines. Daiva Nakayama, M.D. DATE OF BIRTH:  May 07, 1970   DATE OF CONSULTATION:  08/14/2005  DATE OF DISCHARGE:                                   CONSULTATION   This is an intraoperative consult.   HISTORY OF PRESENT ILLNESS:  Susan Horton is a 40 year old black female who was  apparently about 10 days out from a vaginal hysterectomy. She represented  after discharge with nausea and vomiting and abdominal pain. She had some  nausea and vomiting during her previous admission but improved to the point  where she was having large bowel movements and was feeling better, but she  comes back with fevers and a white count of 18,200 yesterday. She apparently  did not report any diarrhea. No chest pain, shortness of breath. The rest of  her review of systems are obtainable right now because she is asleep in the  OR.   PAST MEDICAL HISTORY:  1. Vaginal deliveries.  2. Dysmenorrhea.  3. Menorrhagia.  4. Diabetes.  5. Asthma.  6. Hyperlipidemia.  7. Depression.  8. Thyroid disease.  9. Microalbuminuria.   PAST SURGICAL HISTORY:  1. Tonsillectomy.  2. Cholecystectomy.  3. Appendectomy.  4. Bilateral tubal ligation.  5. Ventral hernia repair.   MEDICATIONS:  1. Insulin.  2. Metformin.  3. Vytorin.  4. Albuterol inhaler.   ALLERGIES:  PENICILLIN.   SOCIAL HISTORY:  She smokes 7 to 10 cigars a day and denies any alcohol use.   FAMILY HISTORY:  Significant for cancer, hypertension, diabetes and heart  disease.   PHYSICAL EXAMINATION:  GENERAL:  She is a black female in the operating room  with an open abdomen. The rest of her exam is unable to be obtained because  she is in the operating room. Findings of her abdominal exam and abdominal  exploration will be explained in her operative report.   ASSESSMENT AND PLAN:  This is a 40 year old black female with fevers,  abdominal pain, picture of a bowel obstruction by report. Unfortunately, the  films were not available for review at the time of the intraoperative  consult. We will plan to help the GYN doctor explore her abdomen.      Sammuel Hines. Daiva Nakayama, M.D.  Electronically Signed     PST/MEDQ  D:  08/14/2005  T:  08/14/2005  Job:  [id number]

## 2010-05-29 NOTE — H&P (Signed)
NAME:  Susan Horton, Susan Horton NO.:  [id number]   MEDICAL RECORD NO.:  [id number]          PATIENT TYPE:  AMB   LOCATION:  SDC                           FACILITY:  [facility]   PHYSICIAN:  Everett Graff, M.D.   DATE OF BIRTH:  22-Feb-1970   DATE OF ADMISSION:  DATE OF DISCHARGE:                                HISTORY & PHYSICAL   HISTORY OF PRESENT ILLNESS:  The patient is a 40 year old single African-  American female who is status post bilateral tubal ligation, para 4-0-0-4  who presents for a total vaginal hysterectomy because of menorrhagia and  dysmenorrhea.  For the past 2 years the patient has had to change 3 super-  tampons in less than an hour during the time she has her menstrual flow  which can last from 5 days to 2 weeks (on occasion she may bleed twice  monthly).  This bleeding is accompanied by severe cramping which she rates  as a 10/10 on a 10 point pain scale and is only decreased to 8/10 with over-  the-counter analgesia.  She denies any urinary tract symptoms, vaginitis  symptoms, fever or changes in her bowel movements.  A pelvic ultrasound in  April of 2007 showed a uterus measuring 9.4 x 5 x 5.2 cm with questionable  adenomyosis and a right ovary measuring 3.1 x 2 x 2.5 cm with a simple cyst  measuring 1.7 cm and a left ovary measuring 2 x 0.9 x 1.2 cm.  The patient  had a TSH done which also returned normal.  In the past the patient has used  oral contraceptives as a means of managing her symptoms, however, due to  severe nausea with this therapy and minimal relief from symptoms she stopped  using this medication.  The patient was given the options of observation,  medical and surgical management of her symptoms.  However, she has chosen  definitive therapy in the form of hysterectomy.   OBSTETRICAL HISTORY:  Gravida 4, para 4-0-0-4, the patient has 4 spontaneous  vaginal births.   GYN HISTORY:  Menarche 40 years old, last menstrual period 07/06/2005.   She  uses bilateral tubal ligation as a method of contraception.  She denies any  history of sexually transmitted diseases.  She has a remote history of an  abnormal Pap smear.  Her last Pap smear was 06/11/2005 and it was within  normal limits.   PAST MEDICAL HISTORY:  Insulin-dependent diabetes, feet, leg and stomach  neuropathy, asthma, hyperlipidemia, depression, microalbuminemia and thyroid  disease.   PAST SURGICAL HISTORY:  In 1981 tonsillectomy, in 1994 cholecystectomy, in  1996 appendectomy and bilateral tubal ligation, in 1998 ventral hernia  repair.   FAMILY HISTORY:  Cancer, hypertension, diabetes, heart disease.   HABITS:  The patient smokes 7-10 cigars per day.  She does not use alcohol.   CURRENT MEDICATIONS:  Metformin, Lantus, Humalog, Vytorin, albuterol  inhaler.   ALLERGIES:  The patient has an allergy to penicillin which causes her to  swell and to itch.   REVIEW OF SYSTEMS:  The patient does wear  contact lens.  The patient does  have dyspareunia.  She experiences nausea which is associated with hunger  and she also has multi nevi.  Otherwise, except as mentioned in the history  of present illness the patient's review of systems is negative.   PHYSICAL EXAMINATION:  VITAL SIGNS:  Blood pressure 110/70, weight 181,  height is 5 feet, 6-1/4 inches tall.  NECK:  Supple without masses.  There is no thyromegaly or cervical  adenopathy.  HEART:  Regular rate and rhythm, no murmur.  LUNGS:  Clear to auscultation.  There are no wheezes, rales or rhonchi.  BACK:  No cerebrovascular accident tenderness.  ABDOMEN:  Without tenderness, masses or organomegaly.  PELVIC EXAM:  EGDUS is within normal limits. Vaginal is normal.  Cervix is  nontender without lesions.  The uterus is normal size, shape and consistency  without tenderness.  Adnexa without tenderness or masses.   IMPRESSION:  1.  Menorrhagia.  2.  Dysmenorrhea.  3.  Insulin-dependent diabetes mellitus.    DISPOSITION:  A discussion was held with the patient regarding the  indications for her procedure along with it's risks which include but are  not limited to reaction to anesthesia, damage to adjacent organs, infection,  excessive bleeding, premature ovarian failure and the possibility of having  to perform surgery through an abdominal incision.  The patient verbalized  understanding of these risks and has consented to proceed with a total  vaginal hysterectomy at [facility] on 08/04/2005 at 11:30  a.m.      [name].      Everett Graff, M.D.  Electronically Signed    EJP/MEDQ  D:  07/29/2005  T:  07/29/2005  Job:  [id number]

## 2010-05-29 NOTE — H&P (Signed)
NAME:  Susan Horton, Susan Horton NO.:  [id number]   MEDICAL RECORD NO.:  [id number]          PATIENT TYPE:  MAT   LOCATION:  MATC                          FACILITY:  [facility]   PHYSICIAN:  Everett Graff, M.D.   DATE OF BIRTH:  October 30, 1970   DATE OF ADMISSION:  08/13/2005  DATE OF DISCHARGE:                                HISTORY & PHYSICAL   CHIEF COMPLAINT:  Abdominal pain and nausea and vomiting.   HISTORY:  Susan Horton is a 40 year old para 4 who is status post total vaginal  hysterectomy secondary to menorrhagia and dysmenorrhea on August 04, 2005.  She also has a history of insulin dependent diabetes which is poorly  controlled.  The patient reports intermittent episodes of nausea and  vomiting with abdominal pain since the procedure and was readmitted over  this past weekend with ileus versus partial small bowel obstruction.  General surgery was consulted via Dr. Zettie Pho and thought it was a probable  ileus with recommendations for an enema.  The patient was given an enema and  responded with two large bowel movements and reported feeling better.  She  was discharged that next day and over the next couple of days did fine until  this morning when she had some more nausea and vomiting and increasing  abdominal pain.  The patient did report a small bowel movement this morning  with positive flatus.  I had the patient report to the maternity admissions  unit at the [facility] for a CT scan which was read as a small bowel  obstruction with pelvic abscess and disruption of vaginal cuff on the right.  The patient is to be admitted for evaluation of small bowel obstruction.   PAST OB HISTORY:  Normal spontaneous vaginal deliveries x4.   PAST GYN HISTORY:  History of regular menses with worsening menorrhagia and  dysmenorrhea status post a vaginal hysterectomy as above.  She had a  bilateral tubal ligation.  She denies any history of sexually transmitted  diseases and reports a  remote history of an abnormal Pap smear.  Her last  Pap smear was in June 2007 and was within normal limits.   PAST MEDICAL HISTORY:  1. Insulin dependent diabetes, poorly controlled with leg and stomach      neuropathy.  2. Asthma.  3. Hyperlipidemia.  4. Depression.  5. Microalbuminemia.  6. Thyroid disease.   PAST SURGICAL HISTORY:  1. Tonsillectomy in 1981.  2. Cholecystectomy in 1994.  3. Appendectomy in 1996.  4. Bilateral tubal ligation.  5. Ventral hernia repair.   FAMILY HISTORY:  Cancer, hypertension, diabetes, and heart disease.   MEDICATIONS:  Lantus, Metformin, Humalog, Vytorin, Albuterol inhaler.   ALLERGIES:  PENICILLIN.   SOCIAL HISTORY:  The patient smokes 7-10 cigars per day and denies alcohol  or drug use.   REVIEW OF SYSTEMS:  The patient denies fever, chills but reports nausea and  vomiting with abdominal pain as above.  She also reports a small bowel  movement this morning and positive flatus and normal genitourinary function.  Neuropathy as above.  No respiratory complaints.   PHYSICAL EXAMINATION:  VITAL SIGNS:  Temperature 99.3, pulse 106, respiratory rate 22, blood  pressure 123/78.  LUNGS:  Clear to auscultation bilaterally.  CARDIOVASCULAR:  Regular rate and rhythm.  ABDOMEN:  Soft, distended, decreased bowel sounds, positive tenderness  diffusely, voluntary guarding, and no rebound.  EXTREMITIES:  No calf tenderness.  GU:  Speculum exam reveals no obvious disruption of cuff, however, a small  area of disruption would be difficult to visualize on speculum exam,  positive tenderness on the right side of the vaginal cuff with a mass on  that side.   ASSESSMENT/PLAN:  Postoperative day nine status post total vaginal  hysterectomy with abdominal pain, nausea and vomiting, and CT scan with  findings of small bowel obstruction, pelvic abscess, and disruption of  vaginal cuff, in a patient with poorly controlled insulin dependent  diabetes.  The  patient is to be admitted to the third floor for surgery in  the morning at 7:30 a.m. and n.p.o. overnight.  General surgery has been  consulted.  I spoke with Dr. Zettie Pho, formerly Dr. Truitt Leep, and she states  she does not think this is an emergent case secondary to no signs of sepsis,  and will not be able to come until 3 a.m. and recommends the surgery be done  at 7:30 a.m. on August 4.  I discussed diagnostic laparoscopy with the  patient in the presence of the nurse with the possibility of removal of a  portion of the bowel and possible laparotomy and the possibility of removing  a portion of the colon and possible colostomy.  The patient verbalized  understanding, her questions were answered, and consent signed and  witnessed.  For her diabetes, I will hold her medicines currently and  hydrate her with lactated ringers and check CBGs q.4h.      Everett Graff, M.D.  Electronically Signed     AR/MEDQ  D:  08/13/2005  T:  08/13/2005  Job:  [id number]

## 2010-05-29 NOTE — Op Note (Signed)
NAME:  Susan Horton, Susan Horton NO.:  [id number]   MEDICAL RECORD NO.:  [id number]          PATIENT TYPE:  INP   LOCATION:  9319                          FACILITY:  [facility]   PHYSICIAN:  Everett Graff, M.D.   DATE OF BIRTH:  18-Oct-1970   DATE OF PROCEDURE:  08/14/2005  DATE OF DISCHARGE:                                 OPERATIVE REPORT   PREOPERATIVE DIAGNOSES:  1. Abdominal pain.  2. Nausea and vomiting.  3. Small-bowel obstruction.  4. Pelvic abscess.  5. Status post total vaginal hysterectomy.   POSTOPERATIVE DIAGNOSES:  1. Abdominal pain.  2. Nausea and vomiting.  3. Partial small-bowel obstruction.  4. Pelvic abscess.  5. Status post total vaginal hysterectomy.  6. Multiple adhesions of small bowel to right lower quadrant pelvic      abscess.   PROCEDURE:  1. Diagnostic laparoscopy.  2. Laparotomy.  3. Lysis of adhesions.  4. Removal of pelvic abscess.  5. Excision of portion of right adnexa.  6. Bowel inspection by general surgery.   ANESTHESIA:  General.   ATTENDING PHYSICIAN:  Everett Graff, M.D.   INTRAOPERATIVE CONSULT:  Sammuel Hines. Marlou Starks, M.D. of general surgery.   FLUID:  3500 mL.   URINE OUTPUT:  400 mL   ESTIMATED BLOOD LOSS:  200 mL.   IRRIGATION:  Copious irrigation with 5.5 L.   FINDINGS:  1. Right lower quadrant abscess.  2. Multiple adhesions of small bowel to right lower quadrant abscess.   COMPLICATIONS:  None.   PROCEDURE:  The patient is taken to the operating room after the risks,  benefits, and alternatives reviewed with patient.  The patient verbalized  understanding and consent signed and witnessed.  The patient was prepped and  draped in the normal sterile fashion in the dorsal lithotomy position and a  sponge on a stick placed in the patient's vagina.  Attention was then turned  to the abdomen where a 10-mm incision was made at the umbilicus and carried  down to the underlying layer of fascia which was excised with the  Mayo  scissors and the peritoneum entered bluntly.  Multiple adhesions of omentum  was noted to the anterior abdominal wall.  A Hasson was placed in the  incision and laparoscope introduced.  Pneumoperitoneum was achieved, and  inspection was performed with adhesions as just mentioned.  There are  multiple adhesions of the small bowel to the right lower quadrant, and  decision was made to place a left lower quadrant trocar for manipulation.  A  5-mm incision was made in the left lower quadrant, and under direct  visualization, a 5-mm trocar was advanced into the intra-abdominal cavity.  Blunt probe was used to identify multiple adhesions and upon removing a  portion of the top layer of adhesions, pus was noted to ooze from that site.  Decision was then made to call general surgery for assistance during the  case and in order to inspect the bowel for any injuries.  Incision was also  made at that time, secondary to multiple adhesions and difficulty with doing  through  the laparoscope, abdominal incision was made with a Pfannenstiel  skin incision.  The incision was carried down to the underlying layer of  fascia with the Bovie, and the fascia was excised bilaterally in the midline  and extended bilaterally with the Mayo scissors.  A Pfannenstiel skin  incision was made after relieving pneumoperitoneum and trocars.  The  umbilical incision was then repaired on the fascia with 0 Vicryl via a  running stitch.  Two stitches had already been placed at the time of  placement of the [place], and those were tied as well.  The skin was repaired  using 3-0 Monocryl via a subcuticular stitch.  The left lower quadrant  incision was also repaired with 3-0 Monocryl via a subcuticular stitch.  After excising the fascia, Kocher clamps were placed on the superior aspect  of the fascial incision and the rectus muscle excised from the fascia.  The  same was done on the inferior aspect of the fascial incision.   The muscle  was separated in the midline and the peritoneum entered bluntly.  The bowel  was packed away and Balfour self-retaining retractor placed and bladder  blade placed as well.  Adhesions were identified and dissected away bluntly.  Copious pus was removed from the right lower quadrant, and anaerobic and  aerobic cultures were obtained and sent for culturing.  By this time, Dr.  Marlou Starks had scrubbed in and helped to remove some of the remaining sidewall  adhesions.  He then inspected the bowel carefully where it had been adherent  to the right lower quadrant, and no bowel injury was noted.  The right  adnexa was noted to be thick and fibrotic consistent with inflammatory  reaction.  Portion of the ovary that was most affected was excised and sent  to pathology, and the remaining portion was tied with a 0 Vicryl tie.  There  was good hemostasis noted; however, the tissue was noted to be very friable  in this area.  Copious irrigation was performed, and please refer to Dr.  Ethlyn Gallery dictation for his portion of the procedure.  The vaginal cuff was  noted to be open, and it was very difficult to place stitches to repair this  area from above, so decision was made to repair from below.  Sterile towels  were placed over the incision after removing Balfour, and in the dorsal  lithotomy position, sponge stick was removed and weighted speculum placed as  well as vaginal wall retractor.  The vagina was prepped with Betadine once  again and vaginal cuff repaired using 0 Vicryl figure-of-eight stitches.  The figure-of-eight stitches were used times two, and then the remaining  portion was closed with a running stitch of 0 Vicryl.  Vaginal exam was  performed, and no remaining open portions were noted.  The area was also  probed with a Q-Tip soaked in Betadine.  After re-gowning and gloving,  attention was then turned to the abdomen again which was irrigated once again.  Good hemostasis was noted,  and Interceed was placed to reduce  adhesion formation.  A 19-French Blake drain was placed at the right aspect  of the abdominal wall.  The drain was placed deep into the pelvis, and all  instruments were removed including the Balfour and the three laparotomy  sponges that were previously placed.  The peritoneum was closed with 2-0  chromic via a running stitch, and the fascia was repaired via 0 Vicryl via a  running stitch  up to the drain from one direction, and then the remaining  portion of the fascia was repaired from the contralateral side to the drain.  A less than 5-mm area of the drain came through the fascial incision.  There  was no bleeding of the muscle noted after irrigation was performed.  The  subcutaneous tissue was reapproximated using 2-0 plain via three interrupted  stitches, and this was done after irrigation of the subcutaneous tissue and  hemostasis with the Bovie.  The skin was reapproximated using 3-0 Monocryl  via a subcuticular stitch.  A pressure dressing was applied.  The patient  tolerated the procedure well and was returned to the recovery room in good  condition.  The patient was given ciprofloxacin and Flagyl, as she was  already on both antibiotics preoperatively.  The patient was placed on  Glucommander, and subcu heparin was started for DVT prophylaxis.      Everett Graff, M.D.  Electronically Signed     AR/MEDQ  D:  08/14/2005  T:  08/15/2005  Job:  [id number]

## 2010-05-29 NOTE — Op Note (Signed)
NAME:  Susan Horton, GAMELIN NO.:  [id number]   MEDICAL RECORD NO.:  [id number]          PATIENT TYPE:  INP   LOCATION:  9319                          FACILITY:  [facility]   PHYSICIAN:  Sammuel Hines. Daiva Nakayama, M.D. DATE OF BIRTH:  May 31, 1970   DATE OF PROCEDURE:  08/14/2005  DATE OF DISCHARGE:                                 OPERATIVE REPORT   PREOPERATIVE DIAGNOSES:  1. Bowel obstruction.  2. Intra-abdominal abscess.   POSTOPERATIVE DIAGNOSIS:  Tubo-ovarian abscess.   PROCEDURE:  Exploratory laparotomy, lysis of adhesions, drainage of tubo-  ovarian abscess.   SURGEON:  Sammuel Hines. Marlou Starks, M.D.   ANESTHESIA:  General endotracheal.   PROCEDURE:  After informed consent was obtained, the patient was brought to  the operating room by her GYN doctor.  She found an abscess in the pelvis  and intraoperatively consulted [name] to help her explore the abdomen.  On  arrival, the patient was asleep in stable condition.  Her abdomen was opened  through a low transverse incision.  The patient had an inflammatory process  down the pelvis.  There were 2 loops of small bowel that were adherent to  this area.  These adhesions were able to be broken up by gentle finger  dissection.  The small bowel was examined and there were was no obvious  injury to the small bowel and no evidence of perforation or leak.  The small  bowel was then packed up out of the way.  The rest of the pelvis was  explored.  The tubo-ovarian structures and vaginal cuff were able to be  separated from the rectosigmoid colon and the rectosigmoid colon had a  pretty normal appearance.  There was an obvious abscess cavity that seemed  to be emanating from the tube and ovary.  This was opened and drained.  At  this point since the bowel appeared to be normal and only secondarily  adherent to this infectious process, we turned the case back over to her GYN  doctor and will happy to follow her during her recovery process.      Sammuel Hines. Daiva Nakayama, M.D.  Electronically Signed     PST/MEDQ  D:  08/14/2005  T:  08/14/2005  Job:  [id number]

## 2010-06-09 ENCOUNTER — Encounter (INDEPENDENT_AMBULATORY_CARE_PROVIDER_SITE_OTHER): Payer: Self-pay | Admitting: Surgery

## 2010-07-01 ENCOUNTER — Encounter (HOSPITAL_BASED_OUTPATIENT_CLINIC_OR_DEPARTMENT_OTHER)
Admission: RE | Admit: 2010-07-01 | Discharge: 2010-07-01 | Disposition: A | Discharge status: Home / Self Care | Payer: Medicaid Other | Source: Ambulatory Visit | Attending: Surgery | Admitting: Surgery

## 2010-07-01 LAB — BASIC METABOLIC PANEL
Chloride: 100 mEq/L (ref 96–112)
GFR calc Af Amer: >60 mL/min (ref >60)
GFR calc non Af Amer: >60 mL/min (ref >60)
Potassium: 4.5 mEq/L (ref 3.5–5.1)

## 2010-07-06 ENCOUNTER — Ambulatory Visit (HOSPITAL_BASED_OUTPATIENT_CLINIC_OR_DEPARTMENT_OTHER)
Admission: RE | Admit: 2010-07-06 | Discharge: 2010-07-06 | Disposition: A | Discharge status: Home / Self Care | Payer: Medicaid Other | Source: Ambulatory Visit | Attending: Surgery | Admitting: Surgery

## 2010-07-06 ENCOUNTER — Other Ambulatory Visit (INDEPENDENT_AMBULATORY_CARE_PROVIDER_SITE_OTHER): Payer: Self-pay | Admitting: Surgery

## 2010-07-06 DIAGNOSIS — Z01812 Encounter for preprocedural laboratory examination: Secondary | ICD-10-CM | POA: Insufficient documentation

## 2010-07-06 DIAGNOSIS — F17200 Nicotine dependence, unspecified, uncomplicated: Secondary | ICD-10-CM | POA: Insufficient documentation

## 2010-07-06 DIAGNOSIS — E119 Type 2 diabetes mellitus without complications: Secondary | ICD-10-CM | POA: Insufficient documentation

## 2010-07-06 DIAGNOSIS — L732 Hidradenitis suppurativa: Secondary | ICD-10-CM

## 2010-07-06 HISTORY — PX: INGUINAL HIDRADENITIS EXCISION: SHX1827

## 2010-07-06 LAB — POCT HEMOGLOBIN-HEMACUE: Hemoglobin: 14.3 g/dL (ref 12.0–15.0)

## 2010-07-06 LAB — GLUCOSE, CAPILLARY
Glucose-Capillary: 282 mg/dL — ABNORMAL HIGH (ref 70–99)
Glucose-Capillary: 294 mg/dL — ABNORMAL HIGH (ref 70–99)

## 2010-07-08 ENCOUNTER — Encounter (INDEPENDENT_AMBULATORY_CARE_PROVIDER_SITE_OTHER): Payer: Self-pay | Admitting: Surgery

## 2010-07-08 ENCOUNTER — Ambulatory Visit (INDEPENDENT_AMBULATORY_CARE_PROVIDER_SITE_OTHER): Payer: Medicaid Other

## 2010-07-08 NOTE — Op Note (Signed)
  NAME:  Susan Horton, FAUGHNAN NO.:  [id number]  MEDICAL RECORD NO.:  [id number]  LOCATION:                                 FACILITY:  PHYSICIAN:  [name], M.D.DATE OF BIRTH:  08-Oct-1970  DATE OF PROCEDURE:  07/06/2010 DATE OF DISCHARGE:                              OPERATIVE REPORT   PREOPERATIVE DIAGNOSIS:  Bilateral hidradenitis pubic inguinal areas.  POSTOPERATIVE DIAGNOSIS:  Bilateral hidradenitis pubic inguinal areas.  PROCEDURE:  Excision of bilateral hidradenitis.  SURGEON:  [name], MD  ANESTHESIA:  General.  CLINICAL HISTORY:  This is a 40 year old lady who has had recurring abscesses in the pubic area really laterally on each side near the inguinal crease.  We treated with some antibiotics.  We decided to electively excise this area.  Between the time I saw her in the office and now the right-sided have become reinfected.  I did discus that with the patient.  We elected to go ahead and reexcise this and try to get this area excised, but leave it partially open.  The left side was a smaller area and appeared quiescent and I thought we could primarily close that and she would need to be on postoperative antibiotics.  Both the areas were marked in the holding area with the patient's participation.  The patient was taken to the operating room and after satisfactory general anesthesia had been obtained, the groin area was prepped and draped as a sterile field and the time-out was done.  I excised on the right side an elliptical incision of about 5 or 6 cm long about 2 cm wide to encompass all the areas that looked like it had old areas of hidradenitis.  I went through skin and subcutaneous tissue.  The area was infiltrated with some Xylocaine.  I then closed with interrupted 4-0 Prolene.  Attention was turned to the right side and this had a wider area of involvement with some purulent drainage and I excised that in a  similar fashion this side about 8 cm x 3.5 cm.  I took a little extra tissue medially after I did the initial excision because I thought there was some additional inflammatory process there.  Again I made sure everything was dry.  I put in some Xylocaine.  I closed this loosely with some 3-0 Prolene and then I put some Telfa wicks in between trying to diminish the width of this area and hopefully shorten the total healing process.  I sent the specimens for pathology but I took one of the areas that looked like there was abscess and cut that out and sent that tissue to the lab for cultures.  The patient tolerated the procedure well and there were no complications.  Counts were correct.     [name], M.D.     CJS/MEDQ  D:  07/06/2010  T:  07/06/2010  Job:  [id number]  cc:   Barton Fanny, M.D.  Electronically Signed by Neldon Mc M.D. on 07/08/2010 08:08:14 PM

## 2010-07-08 NOTE — Progress Notes (Signed)
Pt came in today for nurse to redress rt inq area (hiadraadenitis surgery on 07/06/10  / wound looks clean and dry / redressed and appt had been made  For Friday 06/29/12eh

## 2010-07-09 ENCOUNTER — Encounter (INDEPENDENT_AMBULATORY_CARE_PROVIDER_SITE_OTHER): Payer: Self-pay | Admitting: Surgery

## 2010-07-09 LAB — TISSUE CULTURE

## 2010-07-09 NOTE — Progress Notes (Signed)
Dressing chg// eh

## 2010-07-10 ENCOUNTER — Ambulatory Visit (INDEPENDENT_AMBULATORY_CARE_PROVIDER_SITE_OTHER): Payer: Medicaid Other | Admitting: Surgery

## 2010-07-10 ENCOUNTER — Encounter (INDEPENDENT_AMBULATORY_CARE_PROVIDER_SITE_OTHER): Payer: Self-pay | Admitting: Surgery

## 2010-07-10 VITALS — BP 122/86 | HR 104 | Temp 96.6°F | Ht 65.0 in | Wt 171.6 lb

## 2010-07-10 DIAGNOSIS — B373 Candidiasis of vulva and vagina: Secondary | ICD-10-CM | POA: Insufficient documentation

## 2010-07-10 DIAGNOSIS — B3731 Acute candidiasis of vulva and vagina: Secondary | ICD-10-CM | POA: Insufficient documentation

## 2010-07-10 DIAGNOSIS — L732 Hidradenitis suppurativa: Secondary | ICD-10-CM | POA: Insufficient documentation

## 2010-07-10 MED ORDER — FLUCONAZOLE 100 MG PO TABS: 100.0000 mg | ORAL_TABLET | Freq: Every day | ORAL | Status: AC

## 2010-07-10 NOTE — Patient Instructions (Signed)
We will have you come back next week and have the nurse change her dressings. We have sent a prescription for Diflucan to your pharmacy. Call a subcutaneous or having any problems.

## 2010-07-11 ENCOUNTER — Encounter (INDEPENDENT_AMBULATORY_CARE_PROVIDER_SITE_OTHER): Payer: Self-pay | Admitting: Surgery

## 2010-07-11 NOTE — Progress Notes (Signed)
Chief complaint: This patient presents for postoperative visit  History of present illness: This patient underwent excision of bilateral inguinal hidradenitis on July 06, 2010. At the time of surgery, the left side was free of active infection and was closed primarily. The right side had active purulence. That was closed loosely with packing in between sutures.  The patient has been having minimal pain. She's had no fevers.The patient notes that she has developed some itching and thinks she has developed a yeast infection. She frequently gets those when she takes antibiotics. She requested some Diflucan which seems to resolve this.   Exam: The left side appears to be healing okay. I don't believe there is any infection developing where this has been closed. The right side is open with some drainage. The soft tissues do not appear inflamed and I think this is going to be treated as an abscess. We have repacked and dressed today. She notes that her dressing changes so she will do that subsequently.  Impression: Satisfactory progress following bilateral inguinal hidradenitis excision.  Plan: The patient will continue to do dressing changes at home and either sitz baths or showers. She will return to the office next week for the nurse to check the incisions. We will plan to leave the sutures in on the left side until next week and they can be removed. Likely the sutures on the right side will need to be left little bit longer.  We will give her a short course of Diflucan for the likely monilial infection

## 2010-07-13 ENCOUNTER — Ambulatory Visit (INDEPENDENT_AMBULATORY_CARE_PROVIDER_SITE_OTHER): Payer: Medicaid Other | Admitting: General Surgery

## 2010-07-13 ENCOUNTER — Encounter (INDEPENDENT_AMBULATORY_CARE_PROVIDER_SITE_OTHER): Payer: Self-pay | Admitting: General Surgery

## 2010-07-13 DIAGNOSIS — L732 Hidradenitis suppurativa: Secondary | ICD-10-CM

## 2010-07-13 NOTE — Progress Notes (Signed)
Pt came in today  For stitch removal at bilateral  groin areas of hidradenitis all stitches  Out and wounds redressed // area on right still open a little - instructed pt how to care for and to see drMarland Kitchen  Margot Chimes in 2-3 weeks for follow up or call  If any new  problems / [id number] eh

## 2010-07-14 ENCOUNTER — Encounter (INDEPENDENT_AMBULATORY_CARE_PROVIDER_SITE_OTHER): Payer: Medicaid Other

## 2010-07-20 ENCOUNTER — Emergency Department (HOSPITAL_COMMUNITY)
Admission: EM | Admit: 2010-07-20 | Discharge: 2010-07-20 | Disposition: A | Discharge status: Home / Self Care | Payer: Medicaid Other | Attending: Emergency Medicine | Admitting: Emergency Medicine

## 2010-07-20 ENCOUNTER — Emergency Department (HOSPITAL_COMMUNITY): Payer: Medicaid Other

## 2010-07-20 DIAGNOSIS — R0602 Shortness of breath: Secondary | ICD-10-CM | POA: Insufficient documentation

## 2010-07-20 DIAGNOSIS — J40 Bronchitis, not specified as acute or chronic: Secondary | ICD-10-CM | POA: Insufficient documentation

## 2010-07-20 DIAGNOSIS — E119 Type 2 diabetes mellitus without complications: Secondary | ICD-10-CM | POA: Insufficient documentation

## 2010-07-20 DIAGNOSIS — J069 Acute upper respiratory infection, unspecified: Secondary | ICD-10-CM | POA: Insufficient documentation

## 2010-08-11 ENCOUNTER — Encounter (INDEPENDENT_AMBULATORY_CARE_PROVIDER_SITE_OTHER): Payer: Self-pay | Admitting: Surgery

## 2010-08-11 ENCOUNTER — Ambulatory Visit (INDEPENDENT_AMBULATORY_CARE_PROVIDER_SITE_OTHER): Payer: Medicaid Other | Admitting: Surgery

## 2010-08-11 VITALS — BP 138/88 | HR 96 | Temp 96.8°F

## 2010-08-11 DIAGNOSIS — Z9889 Other specified postprocedural states: Secondary | ICD-10-CM

## 2010-08-11 NOTE — Progress Notes (Signed)
CC: Postop  HPI: This patient comes in for post op follow-up. She underwent excision of bilateral inguinal adenopathy on 07/06/2010. She feels that she is doing well. She believes everything has healed up nicely. She is having no further problems and feels greatly improved from her preoperative situation.  PE: General: The patient appears to be healthy, NAD  Both inguinal incisions have healed nicely. No evidence of infection.  IMPRESSION: The patient is doing well S/P excision hidradenitis.  DATA REVIWED: Pathology report was noted showing inflamed skin and subcutaneous tissues bilaterally with no evidence of malignancy  PLAN: She will recheck here on a p.r.n. basis

## 2010-08-11 NOTE — Patient Instructions (Signed)
Let us see you again if any further problems develop. He may have normal activities. He may try using some Mederma cream on the scars.

## 2010-08-25 ENCOUNTER — Other Ambulatory Visit: Payer: Self-pay | Admitting: Family Medicine

## 2010-08-25 ENCOUNTER — Other Ambulatory Visit (HOSPITAL_COMMUNITY)
Admission: RE | Admit: 2010-08-25 | Discharge: 2010-08-25 | Disposition: A | Discharge status: Home / Self Care | Payer: Medicaid Other | Source: Ambulatory Visit | Attending: Family Medicine | Admitting: Family Medicine

## 2010-08-25 DIAGNOSIS — Z1159 Encounter for screening for other viral diseases: Secondary | ICD-10-CM | POA: Insufficient documentation

## 2010-08-25 DIAGNOSIS — Z01419 Encounter for gynecological examination (general) (routine) without abnormal findings: Secondary | ICD-10-CM | POA: Insufficient documentation

## 2010-09-22 ENCOUNTER — Encounter (INDEPENDENT_AMBULATORY_CARE_PROVIDER_SITE_OTHER): Payer: Medicaid Other | Admitting: Surgery

## 2010-09-28 ENCOUNTER — Telehealth (INDEPENDENT_AMBULATORY_CARE_PROVIDER_SITE_OTHER): Payer: Self-pay | Admitting: Surgery

## 2010-09-28 NOTE — Telephone Encounter (Signed)
Dr Margot Chimes is only in the office until 12:30pm on 10/02/10. If patient can not come that day, she just needs to be offered another appt. Please call patient to reschedule.

## 2010-09-30 ENCOUNTER — Inpatient Hospital Stay (INDEPENDENT_AMBULATORY_CARE_PROVIDER_SITE_OTHER)
Admission: RE | Admit: 2010-09-30 | Discharge: 2010-09-30 | Disposition: A | Discharge status: Home / Self Care | Payer: Medicaid Other | Source: Ambulatory Visit | Attending: Family Medicine | Admitting: Family Medicine

## 2010-09-30 DIAGNOSIS — J45909 Unspecified asthma, uncomplicated: Secondary | ICD-10-CM

## 2010-10-02 ENCOUNTER — Encounter (INDEPENDENT_AMBULATORY_CARE_PROVIDER_SITE_OTHER): Payer: Medicaid Other | Admitting: Surgery

## 2010-10-14 LAB — POCT URINALYSIS DIP (DEVICE)
Ketones, ur: NEGATIVE
Protein, ur: NEGATIVE
Specific Gravity, Urine: 1.02
pH: 6

## 2010-10-14 LAB — WET PREP, GENITAL: Trich, Wet Prep: NONE SEEN

## 2010-10-16 LAB — URINALYSIS, ROUTINE W REFLEX MICROSCOPIC
Bilirubin Urine: NEGATIVE
Glucose, UA: >1000 mg/dL — AB
Ketones, ur: NEGATIVE mg/dL
pH: 6.5 (ref 5.0–8.0)

## 2010-10-16 LAB — POCT I-STAT, CHEM 8
Chloride: 97 mEq/L (ref 96–112)
Glucose, Bld: 426 mg/dL — ABNORMAL HIGH (ref 70–99)
HCT: 45 % (ref 36.0–46.0)
Potassium: 3.8 mEq/L (ref 3.5–5.1)
Sodium: 136 mEq/L (ref 135–145)

## 2010-10-16 LAB — URINE MICROSCOPIC-ADD ON

## 2010-10-16 LAB — CBC
MCHC: 32.6 g/dL (ref 30.0–36.0)
RBC: 5 MIL/uL (ref 3.87–5.11)
WBC: 10.7 10*3/uL — ABNORMAL HIGH (ref 4.0–10.5)

## 2010-10-16 LAB — DIFFERENTIAL
Basophils Relative: 1 % (ref 0–1)
Monocytes Relative: 4 % (ref 3–12)
Neutro Abs: 5.3 10*3/uL (ref 1.7–7.7)
Neutrophils Relative %: 49 % (ref 43–77)

## 2010-10-16 LAB — POCT URINALYSIS DIP (DEVICE)
Bilirubin Urine: NEGATIVE
Glucose, UA: 500 mg/dL — AB
Nitrite: NEGATIVE

## 2010-10-16 LAB — WET PREP, GENITAL: Trich, Wet Prep: NONE SEEN

## 2010-10-16 LAB — GLUCOSE, CAPILLARY
Glucose-Capillary: 110 mg/dL — ABNORMAL HIGH (ref 70–99)
Glucose-Capillary: 345 mg/dL — ABNORMAL HIGH (ref 70–99)

## 2010-10-27 LAB — POCT URINALYSIS DIP (DEVICE)
Glucose, UA: NEGATIVE
Hgb urine dipstick: NEGATIVE
Ketones, ur: NEGATIVE
Nitrite: NEGATIVE
Operator id: 24707
Protein, ur: 30 — AB
Specific Gravity, Urine: 1.025
Urobilinogen, UA: 1
pH: 5.5

## 2010-10-27 LAB — WET PREP, GENITAL
Trich, Wet Prep: NONE SEEN
Yeast Wet Prep HPF POC: NONE SEEN

## 2010-10-28 LAB — POCT URINALYSIS DIP (DEVICE)
Hgb urine dipstick: NEGATIVE
Ketones, ur: NEGATIVE
Protein, ur: NEGATIVE
Specific Gravity, Urine: 1.02
Urobilinogen, UA: 1
pH: 6.5

## 2010-11-24 ENCOUNTER — Encounter (INDEPENDENT_AMBULATORY_CARE_PROVIDER_SITE_OTHER): Payer: Self-pay | Admitting: Surgery

## 2010-11-24 ENCOUNTER — Ambulatory Visit (INDEPENDENT_AMBULATORY_CARE_PROVIDER_SITE_OTHER): Payer: Medicaid Other | Admitting: Surgery

## 2010-11-24 VITALS — BP 148/92 | HR 80 | Temp 97.6°F | Resp 16 | Ht 65.0 in | Wt 179.2 lb

## 2010-11-24 DIAGNOSIS — L732 Hidradenitis suppurativa: Secondary | ICD-10-CM

## 2010-11-24 MED ORDER — FLUCONAZOLE 100 MG PO TABS: 100.0000 mg | ORAL_TABLET | Freq: Every day | ORAL | Status: AC

## 2010-11-24 MED ORDER — DOXYCYCLINE HYCLATE 100 MG PO TABS: 100.0000 mg | ORAL_TABLET | Freq: Two times a day (BID) | ORAL | Status: AC

## 2010-11-24 MED ORDER — DIAZEPAM 5 MG PO TABS: 5.0000 mg | ORAL_TABLET | Freq: Four times a day (QID) | ORAL | Status: AC | PRN

## 2010-11-24 NOTE — Progress Notes (Signed)
Chief complaint: New area of infection left groin  History of present illness: This patient had fairly extensive hidradenitis of the inguinal crease bilaterally right greater than left. I excised this under general anesthesia several months ago in those areas have healed nicely. However she now has a new area above the incision from her prior excision. This is tender and intermittently drains. She thinks she states to be excised.  Past history family history review of systems are unchanged from prior notes  Physical exam: General: The patient is alert awake and oriented  Skin: The prior incisions in the groin inguinal creases are completely healed. About 3 cm above the top of the left-sided incision as a new area that appears to be hidradenitis. There is not an abscess that needs to be drained.  Impression: New area of hidradenitis  Plan: There is small and I think should be excised. We can do this under local anesthesia. I discussed that with the patient and she is agreeable. I would go ahead and start her on doxycycline 100 b.i.d. for week or so. I've also given her a prescription for Valium, 5 mg, to take about an hour before surgery. She knows she'll need somebody to drive for two and from the surgical center.  I think she is a good understanding the plans for the procedure risks and complications. I think all questions have been answered.  We will also give her some Diflucan as she gets yeast with antibiotic usage.

## 2010-11-24 NOTE — Patient Instructions (Addendum)
We will schedule surgery to remove the small area in your left groin that has been infected. Take a Valium about one hjpour before surgery. Have someone drive you to the surgery center.   Start the antibiotic today,  Call if you have any questions.  Try to stop smoaking:  Smoking Cessation This document explains the best ways for you to quit smoking and new treatments to help. It lists new medicines that can double or triple your chances of quitting and quitting for good. It also considers ways to avoid relapses and concerns you may have about quitting, including weight gain. NICOTINE: A POWERFUL ADDICTION If you have tried to quit smoking, you know how hard it can be. It is hard because nicotine is a very addictive drug. For some people, it can be as addictive as heroin or cocaine. Usually, people make 2 or 3 tries, or more, before finally being able to quit. Each time you try to quit, you can learn about what helps and what hurts. Quitting takes hard work and a lot of effort, but you can quit smoking. QUITTING SMOKING IS ONE OF THE MOST IMPORTANT THINGS YOU WILL EVER DO.  You will live longer, feel better, and live better.   The impact on your body of quitting smoking is felt almost immediately:   Within 20 minutes, blood pressure decreases. Pulse returns to its normal level.   After 8 hours, carbon monoxide levels in the blood return to normal. Oxygen level increases.   After 24 hours, chance of heart attack starts to decrease. Breath, hair, and body stop smelling like smoke.   After 48 hours, damaged nerve endings begin to recover. Sense of taste and smell improve.   After 72 hours, the body is virtually free of nicotine. Bronchial tubes relax and breathing becomes easier.   After 2 to 12 weeks, lungs can hold more air. Exercise becomes easier and circulation improves.   Quitting will reduce your risk of having a heart attack, stroke, cancer, or lung disease:   After 1 year, the  risk of coronary heart disease is cut in half.   After 5 years, the risk of stroke falls to the same as a nonsmoker.   After 10 years, the risk of lung cancer is cut in half and the risk of other cancers decreases significantly.   After 15 years, the risk of coronary heart disease drops, usually to the level of a nonsmoker.   If you are pregnant, quitting smoking will improve your chances of having a healthy baby.   The people you live with, especially your children, will be healthier.   You will have extra money to spend on things other than cigarettes.  FIVE KEYS TO QUITTING Studies have shown that these 5 steps will help you quit smoking and quit for good. You have the best chances of quitting if you use them together: 1. Get ready.  2. Get support and encouragement.  3. Learn new skills and behaviors.  4. Get medicine to reduce your nicotine addiction and use it correctly.  5. Be prepared for relapse or difficult situations. Be determined to continue trying to quit, even if you do not succeed at first.  1. GET READY  Set a quit date.   Change your environment.   Get rid of ALL cigarettes, ashtrays, matches, and lighters in your home, car, and place of work.   Do not let people smoke in your home.   Review your past attempts to  quit. Think about what worked and what did not.   Once you quit, do not smoke. NOT EVEN A PUFF!  2. GET SUPPORT AND ENCOURAGEMENT Studies have shown that you have a better chance of being successful if you have help. You can get support in many ways.  Tell your family, friends, and coworkers that you are going to quit and need their support. Ask them not to smoke around you.   Talk to your caregivers (doctor, dentist, nurse, pharmacist, psychologist, and/or smoking counselor).   Get individual, group, or telephone counseling and support. The more counseling you have, the better your chances are of quitting. Programs are available at [employer]  and health centers. Call your local health department for information about programs in your area.   Spiritual beliefs and practices may help some smokers quit.   Quit meters are Insurance underwriter that keep track of quit statistics, such as amount of "quit-time," cigarettes not smoked, and money saved.   Many smokers find one or more of the many self-help books available useful in helping them quit and stay off tobacco.  3. LEARN NEW SKILLS AND BEHAVIORS  Try to distract yourself from urges to smoke. Talk to someone, go for a walk, or occupy your time with a task.   When you first try to quit, change your routine. Take a different route to work. Drink tea instead of coffee. Eat breakfast in a different place.   Do something to reduce your stress. Take a hot bath, exercise, or read a book.   Plan something enjoyable to do every day. Reward yourself for not smoking.   Explore interactive web-based programs that specialize in helping you quit.  4. GET MEDICINE AND USE IT CORRECTLY Medicines can help you stop smoking and decrease the urge to smoke. Combining medicine with the above behavioral methods and support can quadruple your chances of successfully quitting smoking. The [place] Food and Drug Administration (FDA) has approved 7 medicines to help you quit smoking. These medicines fall into 3 categories.  Nicotine replacement therapy (delivers nicotine to your body without the negative effects and risks of smoking):   Nicotine gum: Available over-the-counter.   Nicotine lozenges: Available over-the-counter.   Nicotine inhaler: Available by prescription.   Nicotine nasal spray: Available by prescription.   Nicotine skin patches (transdermal): Available by prescription and over-the-counter.   Antidepressant medicine (helps people abstain from smoking, but how this works is unknown):   Bupropion sustained-release (SR) tablets: Available by prescription.    Nicotinic receptor partial agonist (simulates the effect of nicotine in your brain):   Varenicline tartrate tablets: Available by prescription.   Ask your caregiver for advice about which medicines to use and how to use them. Carefully read the information on the package.   Everyone who is trying to quit may benefit from using a medicine. If you are pregnant or trying to become pregnant, nursing an infant, you are under age 39, or you smoke fewer than 10 cigarettes per day, talk to your caregiver before taking any nicotine replacement medicines.   You should stop using a nicotine replacement product and call your caregiver if you experience nausea, dizziness, weakness, vomiting, fast or irregular heartbeat, mouth problems with the lozenge or gum, or redness or swelling of the skin around the patch that does not go away.   Do not use any other product containing nicotine while using a nicotine replacement product.   Talk to your caregiver before  using these products if you have diabetes, heart disease, asthma, stomach ulcers, you had a recent heart attack, you have high blood pressure that is not controlled with medicine, a history of irregular heartbeat, or you have been prescribed medicine to help you quit smoking.  5. BE PREPARED FOR RELAPSE OR DIFFICULT SITUATIONS  Most relapses occur within the first 3 months after quitting. Do not be discouraged if you start smoking again. Remember, most people try several times before they finally quit.   You may have symptoms of withdrawal because your body is used to nicotine. You may crave cigarettes, be irritable, feel very hungry, cough often, get headaches, or have difficulty concentrating.   The withdrawal symptoms are only temporary. They are strongest when you first quit, but they will go away within 10 to 14 days.  Here are some difficult situations to watch for:  Alcohol. Avoid drinking alcohol. Drinking lowers your chances of successfully  quitting.   Caffeine. Try to reduce the amount of caffeine you consume. It also lowers your chances of successfully quitting.   Other smokers. Being around smoking can make you want to smoke. Avoid smokers.   Weight gain. Many smokers will gain weight when they quit, usually less than 10 pounds. Eat a healthy diet and stay active. Do not let weight gain distract you from your main goal, quitting smoking. Some medicines that help you quit smoking may also help delay weight gain. You can always lose the weight gained after you quit.   Bad mood or depression. There are a lot of ways to improve your mood other than smoking.  If you are having problems with any of these situations, talk to your caregiver. SPECIAL SITUATIONS AND CONDITIONS Studies suggest that everyone can quit smoking. Your situation or condition can give you a special reason to quit.  Pregnant women/new mothers: By quitting, you protect your baby's health and your own.   Hospitalized patients: By quitting, you reduce health problems and help healing.   Heart attack patients: By quitting, you reduce your risk of a second heart attack.   Lung, head, and neck cancer patients: By quitting, you reduce your chance of a second cancer.   Parents of children and adolescents: By quitting, you protect your children from illnesses caused by secondhand smoke.  QUESTIONS TO THINK ABOUT Think about the following questions before you try to stop smoking. You may want to talk about your answers with your caregiver.  Why do you want to quit?   If you tried to quit in the past, what helped and what did not?   What will be the most difficult situations for you after you quit? How will you plan to handle them?   Who can help you through the tough times? Your family? Friends? Caregiver?   What pleasures do you get from smoking? What ways can you still get pleasure if you quit?  Here are some questions to ask your caregiver:  How can you  help me to be successful at quitting?   What medicine do you think would be best for me and how should I take it?   What should I do if I need more help?   What is smoking withdrawal like? How can I get information on withdrawal?  Quitting takes hard work and a lot of effort, but you can quit smoking. FOR MORE INFORMATION  Smokefree.gov (Inrails.tn) provides free, accurate, evidence-based information and professional assistance to help support the immediate and long-term needs  of people trying to quit smoking. Document Released: 12/22/2000 Document Revised: 09/09/2010 Document Reviewed: 10/14/2008 [facility] Patient Information 2012 [place].

## 2010-12-08 ENCOUNTER — Encounter (HOSPITAL_BASED_OUTPATIENT_CLINIC_OR_DEPARTMENT_OTHER): Payer: Self-pay | Admitting: *Deleted

## 2010-12-14 ENCOUNTER — Telehealth (INDEPENDENT_AMBULATORY_CARE_PROVIDER_SITE_OTHER): Payer: Self-pay | Admitting: Surgery

## 2010-12-14 ENCOUNTER — Ambulatory Visit (HOSPITAL_BASED_OUTPATIENT_CLINIC_OR_DEPARTMENT_OTHER): Admission: RE | Admit: 2010-12-14 | Payer: Medicaid Other | Source: Ambulatory Visit | Admitting: Surgery

## 2010-12-14 ENCOUNTER — Encounter (HOSPITAL_BASED_OUTPATIENT_CLINIC_OR_DEPARTMENT_OTHER): Admission: RE | Payer: Self-pay | Source: Ambulatory Visit

## 2010-12-14 HISTORY — DX: Gastro-esophageal reflux disease without esophagitis: K21.9

## 2010-12-14 SURGERY — EXCISION, HIDRADENITIS, INGUINAL REGION
Anesthesia: LOCAL | Site: Groin | Laterality: Left

## 2010-12-14 NOTE — Telephone Encounter (Signed)
She called and has a significant URI with fever and malaise. She wanted to postpone her surgery and plans to see her primary care MD today. Told her to call when she is ready to reschedule

## 2010-12-20 ENCOUNTER — Other Ambulatory Visit: Payer: Self-pay

## 2010-12-20 ENCOUNTER — Encounter (HOSPITAL_COMMUNITY): Payer: Self-pay

## 2010-12-20 ENCOUNTER — Emergency Department (HOSPITAL_COMMUNITY)
Admission: EM | Admit: 2010-12-20 | Discharge: 2010-12-21 | Disposition: A | Discharge status: Home / Self Care | Payer: Medicaid Other | Attending: Emergency Medicine | Admitting: Emergency Medicine

## 2010-12-20 DIAGNOSIS — R1110 Vomiting, unspecified: Secondary | ICD-10-CM | POA: Insufficient documentation

## 2010-12-20 DIAGNOSIS — E1139 Type 2 diabetes mellitus with other diabetic ophthalmic complication: Secondary | ICD-10-CM | POA: Insufficient documentation

## 2010-12-20 DIAGNOSIS — R05 Cough: Secondary | ICD-10-CM

## 2010-12-20 DIAGNOSIS — K219 Gastro-esophageal reflux disease without esophagitis: Secondary | ICD-10-CM | POA: Insufficient documentation

## 2010-12-20 DIAGNOSIS — J45909 Unspecified asthma, uncomplicated: Secondary | ICD-10-CM | POA: Insufficient documentation

## 2010-12-20 DIAGNOSIS — R059 Cough, unspecified: Secondary | ICD-10-CM | POA: Insufficient documentation

## 2010-12-20 DIAGNOSIS — R0789 Other chest pain: Secondary | ICD-10-CM

## 2010-12-20 DIAGNOSIS — R109 Unspecified abdominal pain: Secondary | ICD-10-CM | POA: Insufficient documentation

## 2010-12-20 DIAGNOSIS — R739 Hyperglycemia, unspecified: Secondary | ICD-10-CM

## 2010-12-20 DIAGNOSIS — Z794 Long term (current) use of insulin: Secondary | ICD-10-CM | POA: Insufficient documentation

## 2010-12-20 DIAGNOSIS — R079 Chest pain, unspecified: Secondary | ICD-10-CM | POA: Insufficient documentation

## 2010-12-20 DIAGNOSIS — J069 Acute upper respiratory infection, unspecified: Secondary | ICD-10-CM | POA: Insufficient documentation

## 2010-12-20 NOTE — ED Notes (Signed)
Pt presents with c/o of left flank pain.  Vomiting after coughing spell.  Pt states HX of asthma, recent spell- denies diarrhea, vaginal discharge and urinary problems

## 2010-12-21 ENCOUNTER — Emergency Department (HOSPITAL_COMMUNITY): Payer: Medicaid Other

## 2010-12-21 LAB — URINE MICROSCOPIC-ADD ON

## 2010-12-21 LAB — DIFFERENTIAL
Basophils Absolute: 0 10*3/uL (ref 0.0–0.1)
Basophils Relative: 0 % (ref 0–1)
Monocytes Absolute: 0.4 10*3/uL (ref 0.1–1.0)
Neutro Abs: 4.2 10*3/uL (ref 1.7–7.7)
Neutrophils Relative %: 45 % (ref 43–77)

## 2010-12-21 LAB — URINALYSIS, ROUTINE W REFLEX MICROSCOPIC
Bilirubin Urine: NEGATIVE
Glucose, UA: >1000 mg/dL — AB
Ketones, ur: NEGATIVE mg/dL
Protein, ur: 30 mg/dL — AB
pH: 6 (ref 5.0–8.0)

## 2010-12-21 LAB — CBC
HCT: 34.7 % — ABNORMAL LOW (ref 36.0–46.0)
MCHC: 33.1 g/dL (ref 30.0–36.0)
RDW: 12.7 % (ref 11.5–15.5)

## 2010-12-21 LAB — POCT I-STAT, CHEM 8
Calcium, Ion: 1.18 mmol/L (ref 1.12–1.32)
Chloride: 101 mEq/L (ref 96–112)
HCT: 38 % (ref 36.0–46.0)
Hemoglobin: 12.9 g/dL (ref 12.0–15.0)

## 2010-12-21 LAB — GLUCOSE, CAPILLARY: Glucose-Capillary: 283 mg/dL — ABNORMAL HIGH (ref 70–99)

## 2010-12-21 MED ORDER — METFORMIN HCL 500 MG PO TABS
500.0000 mg | ORAL_TABLET | Freq: Once | ORAL | Status: AC
  Administered 2010-12-21: 500 mg via ORAL
  Filled 2010-12-21: qty 1

## 2010-12-21 MED ORDER — SODIUM CHLORIDE 0.9 % IV BOLUS (SEPSIS)
1000.0000 mL | Freq: Once | INTRAVENOUS | Status: AC
  Administered 2010-12-21: 1000 mL via INTRAVENOUS

## 2010-12-21 MED ORDER — HYDROCODONE-HOMATROPINE 5-1.5 MG/5ML PO SYRP: 5.0000 mL | ORAL_SOLUTION | Freq: Four times a day (QID) | ORAL | Status: AC | PRN

## 2010-12-21 MED ORDER — DIAZEPAM 5 MG PO TABS
5.0000 mg | ORAL_TABLET | Freq: Once | ORAL | Status: AC
  Administered 2010-12-21: 5 mg via ORAL
  Filled 2010-12-21: qty 1

## 2010-12-21 MED ORDER — HYDROCODONE-ACETAMINOPHEN 5-325 MG PO TABS
1.0000 | ORAL_TABLET | Freq: Once | ORAL | Status: AC
  Administered 2010-12-21: 1 via ORAL
  Filled 2010-12-21: qty 1

## 2010-12-21 NOTE — ED Provider Notes (Signed)
Medical screening examination/treatment/procedure(s) were performed by non-physician practitioner and as supervising physician I was immediately available for consultation/collaboration.   Wynetta Fines, MD 12/21/10 [phone]

## 2010-12-21 NOTE — ED Notes (Signed)
Pt has had cough for 3/4 days.  C/O left sided flank pain sharp x 2 days.  Pt taking tylenol at home for pain.  No meds at home for cough. No fever.  Vomiting noted with cough.  Appetite poor.  No change in bowel and bladder.

## 2010-12-21 NOTE — ED Provider Notes (Signed)
History     CSN: [id number] Arrival date & time: 12/20/2010 10:56 PM   First MD Initiated Contact with Patient 12/21/10 0404      Chief Complaint  Patient presents with  . Flank Pain  . Emesis    vomits after coughing spell  . URI    (Consider location/radiation/quality/duration/timing/severity/associated sxs/prior treatment) HPI Comments: Ms. Allendorf reports, left flank pain, worsened by coughing from her asthma has not taken any over-the-counter medication for this.  Does not report any dysuria, frequency, or blood sugars have been slightly elevated.  She states that the coughing has made her vomit on occasion.  Denies fever, diaphoresis  Patient is a 40 y.o. female presenting with flank pain, vomiting, and URI. The history is provided by the patient.  Flank Pain This is a new problem. The current episode started in the past 7 days. The problem occurs 2 to 4 times per day. Associated symptoms include coughing and vomiting. Pertinent negatives include no abdominal pain, fever, myalgias, nausea or weakness. The symptoms are aggravated by coughing. She has tried nothing for the symptoms. The treatment provided no relief.  Emesis  Associated symptoms include cough and URI. Pertinent negatives include no abdominal pain, no fever and no myalgias.  URI The primary symptoms include cough and vomiting. Primary symptoms do not include fever, abdominal pain, nausea or myalgias.  The illness is not associated with rhinorrhea.    Past Medical History  Diagnosis Date  . Hidradenitis   . Asthma     prn inhaler  . GERD (gastroesophageal reflux disease)   . Diabetes mellitus     IDDM    Past Surgical History  Procedure Date  . Tubal ligation   . Appendectomy   . Cholecystectomy   . Tonsillectomy   . Inguinal hidradenitis excision 07/06/2010    Bilateral  . Vaginal hysterectomy 08/04/2005    and cysto  . Exploratory laparotomy 08/14/2005    lysis of adhesions, drainage of tubo-ovarian  abscess  . Breast reduction surgery     Family History  Problem Relation Age of Onset  . Hypertension Father   . Hypertension Sister   . Hypertension Brother     History  Substance Use Topics  . Smoking status: Current Everyday Smoker -- 10 years    Types: Cigarettes  . Smokeless tobacco: Never Used   Comment: 7 cig./day  . Alcohol Use: No    OB History    Grav Para Term Preterm Abortions TAB SAB Ect Mult Living                  Review of Systems  Constitutional: Negative for fever.  HENT: Negative for rhinorrhea.   Eyes: Negative.   Respiratory: Positive for cough.   Cardiovascular: Negative.   Gastrointestinal: Positive for vomiting. Negative for nausea and abdominal pain.  Genitourinary: Positive for flank pain. Negative for dysuria and frequency.  Musculoskeletal: Negative for myalgias.  Skin: Negative.   Neurological: Negative.  Negative for weakness.  Hematological: Negative.   Psychiatric/Behavioral: Negative.     Allergies  Penicillins  Home Medications   Current Outpatient Rx  Name Route Sig Dispense Refill  . ALBUTEROL SULFATE HFA 108 (90 BASE) MCG/ACT IN AERS Inhalation Inhale 2 puffs into the lungs as needed.      [name] LIPITOR PO Oral Take 40 mg by mouth daily. PM    . INSULIN GLARGINE 100 UNIT/ML [place] SOLN Subcutaneous Inject 40 Units into the skin 2 (two) times daily.     [name]  METFORMIN HCL PO Oral Take 500 mg by mouth daily. AM    . PROTONIX PO Oral Take 40 mg by mouth daily. AM      BP 120/84  Pulse 77  Temp(Src) 98.2 F (36.8 C) (Oral)  Resp 18  Ht 5\' 5"  (1.651 m)  Wt 172 lb (78.019 kg)  BMI 28.62 kg/m2  SpO2 99%  Physical Exam  Constitutional: She is oriented to person, place, and time. She appears well-developed.  HENT:  Head: Normocephalic.  Eyes: Pupils are equal, round, and reactive to light.  Neck: Normal range of motion.  Cardiovascular: Normal rate.   Pulmonary/Chest: Effort normal.  Abdominal: Soft. She exhibits no distension.  There is no tenderness. There is no guarding.  Musculoskeletal: Normal range of motion.  Neurological: She is alert and oriented to person, place, and time.  Skin: Skin is warm.    ED Course  Procedures (including critical care time)  Labs Reviewed  GLUCOSE, CAPILLARY - Abnormal; Notable for the following:    Glucose-Capillary 340 (*)    All other components within normal limits  POCT CBG MONITORING  URINALYSIS, ROUTINE W REFLEX MICROSCOPIC   No results found.   No diagnosis found.  ED ECG REPORT   Date: 12/21/2010  EKG Time: 6:22 AM  Rate: 93  Rhythm: normal sinus rhythm,  normal EKG, normal sinus rhythm, unchanged from previous tracings  Axis: normal  Intervals:none  ST&T Change: normal  Narrative Interpretation: normal EKG     Chest x-ray normal.  Urine is normal.  The blood sugar is 330.  Will obtain CBC and i-STAT, hydrate and reassess       MDM   urinary tract infection, versus pyelonephritis, versus muscle strain from coughing        Garald Balding, NP 12/21/10 0436  Garald Balding, NP 12/21/10 [id number]  Garald Balding, NP 12/21/10 0622  Garald Balding, NP 12/21/10 [phone]

## 2011-02-02 ENCOUNTER — Ambulatory Visit (INDEPENDENT_AMBULATORY_CARE_PROVIDER_SITE_OTHER): Payer: Medicaid Other | Admitting: Surgery

## 2011-02-02 ENCOUNTER — Encounter (HOSPITAL_BASED_OUTPATIENT_CLINIC_OR_DEPARTMENT_OTHER): Payer: Self-pay | Admitting: *Deleted

## 2011-02-02 ENCOUNTER — Encounter (INDEPENDENT_AMBULATORY_CARE_PROVIDER_SITE_OTHER): Payer: Self-pay | Admitting: Surgery

## 2011-02-02 VITALS — BP 132/86 | HR 106 | Temp 98.9°F | Resp 20 | Ht 65.0 in | Wt 176.8 lb

## 2011-02-02 DIAGNOSIS — L732 Hidradenitis suppurativa: Secondary | ICD-10-CM

## 2011-02-02 NOTE — Progress Notes (Signed)
Chief complaint: Hidradenitis  History of present illness: This patient had two areas of the inguinal hidradenitis excised last summer. It has healed nicely. However she has a new area on the left side above where we excised the other areas. This was scheduled for an excision but she had an exacerbation of her asthma and had to postpone surgery. She came back today for [name] to reevaluate the area prior to doing a surgical excision. She has had no new problems with the area although it continues to drain a little  bit. She has been medically stable.  Past history, family history, and review of systems: All basically unchanged from her prior visit  Meds: Current Outpatient Prescriptions  Medication Sig Dispense Refill  . albuterol (PROVENTIL HFA;VENTOLIN HFA) 108 (90 BASE) MCG/ACT inhaler Inhale 2 puffs into the lungs as needed.        . Atorvastatin Calcium (LIPITOR PO) Take 40 mg by mouth daily. PM      . insulin glargine (LANTUS) 100 UNIT/ML injection Inject 40 Units into the skin 2 (two) times daily.       [name] METFORMIN HCL PO Take 500 mg by mouth daily. PM      . Pantoprazole Sodium (PROTONIX PO) Take 40 mg by mouth daily. AM         Allergies:  Allergies  Allergen Reactions  . Penicillins Swelling and Rash     Exam: Gen.: Patient is alert oriented healthy appearing Lungs: Normal respirations are clear to auscultation Heart: Rhythm is regular no murmurs rubs or gallops are heard Abdomen: Soft and benign. There are no masses or organomegaly. Inguinal area: The right groin area is completely healed. The area was excised to the left groin is likewise healed. There is an open draining sinus in the inguinal crease above her old scar that drained there is no frank abscess present.  Data reviewed: I went over our old office notes  Impression: Hidradenitis  Plan: Excised with primary closure. I went over the risks and complications including the possibility of infection and recurrent  disease. I think all questions are answered. We will proceed to surgery next Monday.

## 2011-02-02 NOTE — Patient Instructions (Signed)
Don't eat or drink anything after midnight the night before surgery.

## 2011-02-04 ENCOUNTER — Encounter (HOSPITAL_BASED_OUTPATIENT_CLINIC_OR_DEPARTMENT_OTHER)
Admission: RE | Admit: 2011-02-04 | Discharge: 2011-02-04 | Disposition: A | Discharge status: Home / Self Care | Payer: Medicaid Other | Source: Ambulatory Visit | Attending: Surgery | Admitting: Surgery

## 2011-02-04 LAB — BASIC METABOLIC PANEL
Calcium: 9.4 mg/dL (ref 8.4–10.5)
Chloride: 96 mEq/L (ref 96–112)
Creatinine, Ser: 0.57 mg/dL (ref 0.50–1.10)
GFR calc Af Amer: >90 mL/min (ref >90)
GFR calc non Af Amer: >90 mL/min (ref >90)

## 2011-02-08 ENCOUNTER — Ambulatory Visit (HOSPITAL_BASED_OUTPATIENT_CLINIC_OR_DEPARTMENT_OTHER)
Admission: RE | Admit: 2011-02-08 | Discharge: 2011-02-08 | Disposition: A | Discharge status: Home / Self Care | Payer: Medicaid Other | Source: Ambulatory Visit | Attending: Surgery | Admitting: Surgery

## 2011-02-08 ENCOUNTER — Encounter (HOSPITAL_BASED_OUTPATIENT_CLINIC_OR_DEPARTMENT_OTHER)
Admission: RE | Disposition: A | Discharge status: Home / Self Care | Payer: Self-pay | Source: Ambulatory Visit | Attending: Surgery

## 2011-02-08 ENCOUNTER — Ambulatory Visit (HOSPITAL_BASED_OUTPATIENT_CLINIC_OR_DEPARTMENT_OTHER): Payer: Medicaid Other | Admitting: Anesthesiology

## 2011-02-08 ENCOUNTER — Encounter (HOSPITAL_BASED_OUTPATIENT_CLINIC_OR_DEPARTMENT_OTHER): Payer: Self-pay

## 2011-02-08 ENCOUNTER — Encounter (HOSPITAL_BASED_OUTPATIENT_CLINIC_OR_DEPARTMENT_OTHER): Payer: Self-pay | Admitting: Anesthesiology

## 2011-02-08 ENCOUNTER — Other Ambulatory Visit (INDEPENDENT_AMBULATORY_CARE_PROVIDER_SITE_OTHER): Payer: Self-pay | Admitting: Surgery

## 2011-02-08 DIAGNOSIS — L732 Hidradenitis suppurativa: Secondary | ICD-10-CM

## 2011-02-08 DIAGNOSIS — F17200 Nicotine dependence, unspecified, uncomplicated: Secondary | ICD-10-CM | POA: Insufficient documentation

## 2011-02-08 DIAGNOSIS — E109 Type 1 diabetes mellitus without complications: Secondary | ICD-10-CM | POA: Insufficient documentation

## 2011-02-08 DIAGNOSIS — K219 Gastro-esophageal reflux disease without esophagitis: Secondary | ICD-10-CM | POA: Insufficient documentation

## 2011-02-08 DIAGNOSIS — J45909 Unspecified asthma, uncomplicated: Secondary | ICD-10-CM | POA: Insufficient documentation

## 2011-02-08 HISTORY — PX: HYDRADENITIS EXCISION: SHX5243

## 2011-02-08 LAB — GLUCOSE, CAPILLARY: Glucose-Capillary: 244 mg/dL — ABNORMAL HIGH (ref 70–99)

## 2011-02-08 SURGERY — EXCISION, HIDRADENITIS, INGUINAL REGION
Anesthesia: General

## 2011-02-08 MED ORDER — ONDANSETRON HCL 4 MG/2ML IJ SOLN
INTRAMUSCULAR | Status: DC | PRN
  Administered 2011-02-08: 4 mg via INTRAVENOUS

## 2011-02-08 MED ORDER — MIDAZOLAM HCL 5 MG/5ML IJ SOLN
INTRAMUSCULAR | Status: DC | PRN
  Administered 2011-02-08: 2 mg via INTRAVENOUS

## 2011-02-08 MED ORDER — PROPOFOL 10 MG/ML IV EMUL
INTRAVENOUS | Status: DC | PRN
  Administered 2011-02-08: 200 mg via INTRAVENOUS

## 2011-02-08 MED ORDER — LACTATED RINGERS IV SOLN
INTRAVENOUS | Status: DC
  Administered 2011-02-08 (×2): via INTRAVENOUS

## 2011-02-08 MED ORDER — HYDROMORPHONE HCL PF 1 MG/ML IJ SOLN: 0.2500 mg | INTRAMUSCULAR | Status: DC | PRN

## 2011-02-08 MED ORDER — HYDROCODONE-ACETAMINOPHEN 5-325 MG PO TABS: 1.0000 | ORAL_TABLET | ORAL | Status: AC | PRN

## 2011-02-08 MED ORDER — FENTANYL CITRATE 0.05 MG/ML IJ SOLN
INTRAMUSCULAR | Status: DC | PRN
  Administered 2011-02-08: 100 ug via INTRAVENOUS

## 2011-02-08 MED ORDER — FLUCONAZOLE 200 MG PO TABS: 200.0000 mg | ORAL_TABLET | Freq: Every day | ORAL | Status: AC

## 2011-02-08 MED ORDER — CIPROFLOXACIN IN D5W 400 MG/200ML IV SOLN
INTRAVENOUS | Status: DC | PRN
  Administered 2011-02-08: 400 mg via INTRAVENOUS

## 2011-02-08 MED ORDER — LIDOCAINE HCL (CARDIAC) 20 MG/ML IV SOLN
INTRAVENOUS | Status: DC | PRN
  Administered 2011-02-08: 60 mg via INTRAVENOUS

## 2011-02-08 MED ORDER — DROPERIDOL 2.5 MG/ML IJ SOLN: 0.6250 mg | INTRAMUSCULAR | Status: DC | PRN

## 2011-02-08 MED ORDER — BUPIVACAINE-EPINEPHRINE PF 0.25-1:200000 % IJ SOLN
INTRAMUSCULAR | Status: DC | PRN
  Administered 2011-02-08: 10 mL

## 2011-02-08 MED ORDER — CIPROFLOXACIN HCL 500 MG PO TABS: 500.0000 mg | ORAL_TABLET | Freq: Two times a day (BID) | ORAL | Status: AC

## 2011-02-08 SURGICAL SUPPLY — 36 items
ADH SKN CLS APL DERMABOND .7 (GAUZE/BANDAGES/DRESSINGS) ×1
BLADE HEX COATED 2.75 (ELECTRODE) ×2 IMPLANT
BLADE SURG 10 STRL SS (BLADE) ×2 IMPLANT
BLADE SURG ROTATE 9660 (MISCELLANEOUS) ×2 IMPLANT
CHLORAPREP W/TINT 26ML (MISCELLANEOUS) ×1 IMPLANT
CLOTH BEACON ORANGE TIMEOUT ST (SAFETY) ×2 IMPLANT
COVER MAYO STAND STRL (DRAPES) ×2 IMPLANT
COVER TABLE BACK 60X90 (DRAPES) ×2 IMPLANT
DECANTER SPIKE VIAL GLASS SM (MISCELLANEOUS) IMPLANT
DERMABOND ADVANCED (GAUZE/BANDAGES/DRESSINGS) ×1
DERMABOND ADVANCED .7 DNX12 (GAUZE/BANDAGES/DRESSINGS) ×1 IMPLANT
DRAPE LAPAROTOMY TRNSV 102X78 (DRAPE) ×2 IMPLANT
DRAPE UTILITY XL STRL (DRAPES) ×2 IMPLANT
ELECT REM PT RETURN 9FT ADLT (ELECTROSURGICAL) ×2
ELECTRODE REM PT RTRN 9FT ADLT (ELECTROSURGICAL) ×1 IMPLANT
GAUZE SPONGE 4X4 12PLY STRL LF (GAUZE/BANDAGES/DRESSINGS) ×1 IMPLANT
GLOVE EUDERMIC 7 POWDERFREE (GLOVE) ×2 IMPLANT
GOWN PREVENTION PLUS XLARGE (GOWN DISPOSABLE) ×4 IMPLANT
NEEDLE HYPO 25X1 1.5 SAFETY (NEEDLE) ×2 IMPLANT
NS IRRIG 1000ML POUR BTL (IV SOLUTION) IMPLANT
PACK BASIN DAY SURGERY FS (CUSTOM PROCEDURE TRAY) ×2 IMPLANT
PACK LITHOTOMY IV (CUSTOM PROCEDURE TRAY) IMPLANT
PENCIL BUTTON HOLSTER BLD 10FT (ELECTRODE) ×2 IMPLANT
SLEEVE SCD COMPRESS KNEE MED (MISCELLANEOUS) ×1 IMPLANT
SPONGE LAP 4X18 X RAY DECT (DISPOSABLE) ×2 IMPLANT
SUT MNCRL AB 4-0 PS2 18 (SUTURE) ×1 IMPLANT
SUT PROLENE 4 0 PS 2 18 (SUTURE) ×1 IMPLANT
SUT VIC AB 3-0 CT1 27 (SUTURE)
SUT VIC AB 3-0 CT1 27XBRD (SUTURE) ×2 IMPLANT
SUT VIC AB 4-0 BRD 54 (SUTURE) ×1 IMPLANT
SYR CONTROL 10ML LL (SYRINGE) ×2 IMPLANT
TAPE HYPAFIX 4 X10 (GAUZE/BANDAGES/DRESSINGS) IMPLANT
TOWEL OR 17X24 6PK STRL BLUE (TOWEL DISPOSABLE) ×2 IMPLANT
TOWEL OR NON WOVEN STRL DISP B (DISPOSABLE) ×2 IMPLANT
TRAY DSU PREP LF (CUSTOM PROCEDURE TRAY) ×1 IMPLANT
WATER STERILE IRR 1000ML POUR (IV SOLUTION) IMPLANT

## 2011-02-08 NOTE — Op Note (Signed)
Susan Horton Feb 27, 1970 [id number] 01/27/2011  Preoperative diagnosis: Hidradenitis left groin  Postoperative diagnosis: Same  Procedure: Excision hidradenitis left groin  Surgeon: [name], MD, F  Anesthesia: General   Clinical History and Indications: This patient has had recurring episodes of hidradenitis of the groin. Several months ago she had 2 areas excise one for the right And 1 for the left. She has a new area on the left.  Description of Procedure: The patient was seen in the preoperative area and the procedure reviewed and the left groin area marked. She was taken to the operating room after satisfactory general anesthesia had been obtained the groin area was clipped prepped and draped. A time out was done. I infiltrated 0.25% Marcaine with epinephrine to help with postop pain relief. I made an elliptical incision around the dermal draining area that had been sign of her persistent new area. I excised skin and subcutaneous tissue well around the area. I made sure everything was dry.  I closed with 4-0 Prolene. Sterile dressings were applied. The patient tolerated the procedure well. There were no operative complications.  [name], MD, FACS 02/08/2011 11:51 AM

## 2011-02-08 NOTE — Transfer of Care (Signed)
Immediate Anesthesia Transfer of Care Note  Patient: Susan Horton  Procedure(s) Performed:  EXCISION HYDRADENITIS GROIN - Excisioin of Hidradenitis Left groin  Patient Location: PACU  Anesthesia Type: General  Level of Consciousness: sedated  Airway & Oxygen Therapy: Patient Spontanous Breathing and Patient connected to face mask oxygen  Post-op Assessment: Report given to PACU RN and Post -op Vital signs reviewed and stable  Post vital signs: Reviewed and stable  Complications: No apparent anesthesia complications

## 2011-02-08 NOTE — Anesthesia Postprocedure Evaluation (Signed)
Anesthesia Post Note  Patient: Susan Horton  Procedure(s) Performed:  EXCISION HYDRADENITIS GROIN - Excisioin of Hidradenitis Left groin  Anesthesia type: general  Patient location: PACU  Post pain: Pain level controlled  Post assessment: Patient's Cardiovascular Status Stable  Last Vitals:  Filed Vitals:   02/08/11 1326  BP: 131/77  Pulse: 84  Temp: 36.9 C  Resp: 16    Post vital signs: Reviewed and stable  Level of consciousness: sedated  Complications: No apparent anesthesia complications

## 2011-02-08 NOTE — Anesthesia Preprocedure Evaluation (Signed)
Anesthesia Evaluation  Patient identified by MRN, date of birth, ID band Patient awake    Reviewed: Allergy & Precautions, H&P , NPO status , Patient's Chart, lab work & pertinent test results  Airway       Dental   Pulmonary asthma , Current Smoker,  clear to auscultation  Pulmonary exam normal       Cardiovascular neg cardio ROS Regular Normal- Systolic murmurs    Neuro/Psych    GI/Hepatic negative GI ROS, Neg liver ROS, GERD-  Medicated and Controlled,  Endo/Other  Diabetes mellitus-, Well Controlled, Type 1  Renal/GU negative Renal ROS     Musculoskeletal   Abdominal   Peds  Hematology   Anesthesia Other Findings   Reproductive/Obstetrics                           Anesthesia Physical Anesthesia Plan  ASA: II  Anesthesia Plan: General   Post-op Pain Management:    Induction: Intravenous  Airway Management Planned: LMA  Additional Equipment:   Intra-op Plan:   Post-operative Plan: Extubation in OR  Informed Consent:   Plan Discussed with: CRNA, Anesthesiologist and Surgeon  Anesthesia Plan Comments:         Anesthesia Quick Evaluation

## 2011-02-08 NOTE — Interval H&P Note (Signed)
History and Physical Interval Note:  02/08/2011 11:11 AM  Susan Horton  has presented today for surgery, with the diagnosis of hidradenitis left groin  The various methods of treatment have been discussed with the patient and family. After consideration of risks, benefits and other options for treatment, the patient has consented to  Procedure(s): EXCISION HYDRADENITIS Left GROIN as a surgical intervention .  The patients' history has been reviewed, patient examined, no change in status, stable for surgery.  I have reviewed the patients' chart and labs.  Questions were answered to the patient's satisfaction.     Rhandi Despain J

## 2011-02-08 NOTE — H&P (View-Only) (Signed)
Chief complaint: Hidradenitis  History of present illness: This patient had two areas of the inguinal hidradenitis excised last summer. It has healed nicely. However she has a new area on the left side above where we excised the other areas. This was scheduled for an excision but she had an exacerbation of her asthma and had to postpone surgery. She came back today for [name] to reevaluate the area prior to doing a surgical excision. She has had no new problems with the area although it continues to drain a little  bit. She has been medically stable.  Past history, family history, and review of systems: All basically unchanged from her prior visit  Meds: Current Outpatient Prescriptions  Medication Sig Dispense Refill  . albuterol (PROVENTIL HFA;VENTOLIN HFA) 108 (90 BASE) MCG/ACT inhaler Inhale 2 puffs into the lungs as needed.        . Atorvastatin Calcium (LIPITOR PO) Take 40 mg by mouth daily. PM      . insulin glargine (LANTUS) 100 UNIT/ML injection Inject 40 Units into the skin 2 (two) times daily.       [name] METFORMIN HCL PO Take 500 mg by mouth daily. PM      . Pantoprazole Sodium (PROTONIX PO) Take 40 mg by mouth daily. AM         Allergies:  Allergies  Allergen Reactions  . Penicillins Swelling and Rash     Exam: Gen.: Patient is alert oriented healthy appearing Lungs: Normal respirations are clear to auscultation Heart: Rhythm is regular no murmurs rubs or gallops are heard Abdomen: Soft and benign. There are no masses or organomegaly. Inguinal area: The right groin area is completely healed. The area was excised to the left groin is likewise healed. There is an open draining sinus in the inguinal crease above her old scar that drained there is no frank abscess present.  Data reviewed: I went over our old office notes  Impression: Hidradenitis  Plan: Excised with primary closure. I went over the risks and complications including the possibility of infection and recurrent  disease. I think all questions are answered. We will proceed to surgery next Monday.

## 2011-02-09 ENCOUNTER — Encounter (HOSPITAL_BASED_OUTPATIENT_CLINIC_OR_DEPARTMENT_OTHER): Payer: Self-pay | Admitting: Surgery

## 2011-02-13 ENCOUNTER — Telehealth (INDEPENDENT_AMBULATORY_CARE_PROVIDER_SITE_OTHER): Payer: Self-pay | Admitting: General Surgery

## 2011-02-13 NOTE — Telephone Encounter (Signed)
She called again saying that she had some bloody drainage on her dressing which soaked the gauze.  She changed the gauze and she feels slightly better.  I again recommended that she come in to [facility] ER for evaluation of what sounds like a postop hematoma without infection.

## 2011-02-13 NOTE — Telephone Encounter (Signed)
Patient called stating that her groin incision is increasing in pain and her hydrocodone is not working long enough.  She says that the incision is not red or draining and there are no fevers or warmth but she has just had increased pain.  I offered to see her in the [facility] ER to make sure that the wound looks okay and I tried to prescribe a stronger pain medication through [facility] but this could not be eprescribed.  I recommended that she come to [facility] ER if her pain control is inadequate with the current meds and we could consider altering the prescription or admission for pain control.  She is diabetic but she says that her kidneys are okay.  She can trial motrin sparingly as well to help with the pain control but only for 1-2 days.

## 2011-02-19 ENCOUNTER — Encounter (INDEPENDENT_AMBULATORY_CARE_PROVIDER_SITE_OTHER): Payer: Self-pay | Admitting: Surgery

## 2011-02-19 ENCOUNTER — Ambulatory Visit (INDEPENDENT_AMBULATORY_CARE_PROVIDER_SITE_OTHER): Payer: Medicaid Other | Admitting: Surgery

## 2011-02-19 ENCOUNTER — Encounter (INDEPENDENT_AMBULATORY_CARE_PROVIDER_SITE_OTHER): Payer: Self-pay | Admitting: General Surgery

## 2011-02-19 VITALS — BP 130/72 | HR 68 | Resp 20 | Ht 65.0 in | Wt 172.0 lb

## 2011-02-19 DIAGNOSIS — Z9889 Other specified postprocedural states: Secondary | ICD-10-CM

## 2011-02-19 NOTE — Progress Notes (Signed)
NAME: Susan Horton                                            DOB: 09-Apr-1970 DATE: 02/19/2011                                                  MRN: [id number]  CC: Post op   HPI: This patient comes in for post op follow-up.Sheunderwent excision left groin hidradenitis on 02/08/11. She feels that she is doing well.  PE: General: The patient appears to be healthy, NAD Wound healing nicely  DATA REVIEWED: Path noted   IMPRESSION: The patient is doing well S/P surgical excision.    PLAN: RTC PRN

## 2011-02-19 NOTE — Patient Instructions (Signed)
We will see you again on an as needed basis. Please call the office at [phone] if you have any questions or concerns. Thank you for allowing us to take care of you.  

## 2011-04-16 ENCOUNTER — Other Ambulatory Visit (HOSPITAL_COMMUNITY): Payer: Self-pay | Admitting: Family Medicine

## 2011-04-16 DIAGNOSIS — Z1231 Encounter for screening mammogram for malignant neoplasm of breast: Secondary | ICD-10-CM

## 2011-05-12 ENCOUNTER — Ambulatory Visit (HOSPITAL_COMMUNITY)
Admission: RE | Admit: 2011-05-12 | Discharge: 2011-05-12 | Disposition: A | Discharge status: Home / Self Care | Payer: Medicaid Other | Source: Ambulatory Visit | Attending: Family Medicine | Admitting: Family Medicine

## 2011-05-12 DIAGNOSIS — Z1231 Encounter for screening mammogram for malignant neoplasm of breast: Secondary | ICD-10-CM | POA: Insufficient documentation

## 2011-06-04 ENCOUNTER — Emergency Department (HOSPITAL_COMMUNITY)
Admission: EM | Admit: 2011-06-04 | Discharge: 2011-06-04 | Disposition: A | Discharge status: Home / Self Care | Payer: Medicaid Other | Source: Home / Self Care | Attending: Emergency Medicine | Admitting: Emergency Medicine

## 2011-06-04 ENCOUNTER — Encounter (HOSPITAL_COMMUNITY): Payer: Self-pay | Admitting: Emergency Medicine

## 2011-06-04 DIAGNOSIS — S0500XA Injury of conjunctiva and corneal abrasion without foreign body, unspecified eye, initial encounter: Secondary | ICD-10-CM

## 2011-06-04 DIAGNOSIS — S058X9A Other injuries of unspecified eye and orbit, initial encounter: Secondary | ICD-10-CM

## 2011-06-04 MED ORDER — TETRACAINE HCL 0.5 % OP SOLN
1.0000 [drp] | Freq: Once | OPHTHALMIC | Status: AC
  Administered 2011-06-04: 2 [drp] via OPHTHALMIC

## 2011-06-04 MED ORDER — CYCLOPENTOLATE HCL 1 % OP SOLN: 1.0000 [drp] | Freq: Every day | OPHTHALMIC | Status: AC

## 2011-06-04 MED ORDER — POLYETHYL GLYCOL-PROPYL GLYCOL 0.4-0.3 % OP SOLN: 1.0000 [drp] | Freq: Four times a day (QID) | OPHTHALMIC | Status: DC | PRN

## 2011-06-04 MED ORDER — HYDROCODONE-ACETAMINOPHEN 5-325 MG PO TABS: 2.0000 | ORAL_TABLET | ORAL | Status: AC | PRN

## 2011-06-04 MED ORDER — MOXIFLOXACIN HCL 0.5 % OP SOLN: 1.0000 [drp] | Freq: Three times a day (TID) | OPHTHALMIC | Status: AC

## 2011-06-04 MED ORDER — TETRACAINE HCL 0.5 % OP SOLN
OPHTHALMIC | Status: AC
  Filled 2011-06-04: qty 2

## 2011-06-04 NOTE — ED Provider Notes (Signed)
History     CSN: [id number]  Arrival date & time 06/04/11  1633   First MD Initiated Contact with Patient 06/04/11 1702      Chief Complaint  Patient presents with  . Eye Pain    (Consider location/radiation/quality/duration/timing/severity/associated sxs/prior treatment) HPI Comments: Patient reports right visual pain, increased tearing, blurry, "cloudy" vision, photophobia, consensual photophobia starting a week ago. reports some eye discharge.  She does not recall any trauma to her eye, but she was [place] ED, diagnosed with a corneal abrasion. She was started on Tobrex, which she started 6 days ago. She is taking this daily. She was evaluated at an out of state eye clinic the next day, prescribed atropine, Vigamox, Pred forte 1%, but she was unable to afford his medications, as Medicaid would not pay for them while she was out of state. She says that the discharge, redness has gotten better, but she reports photophobia, decreased vision. Reports headache from having to "strain to see". She wears contacts, but has not been wearing them since she scratched her eye. No fevers, nausea, vomiting. No scotomas, seeing halos around lights.   ROS as noted in HPI. All other ROS negative.   Patient is a 41 y.o. female presenting with eye pain. The history is provided by the patient. No language interpreter was used.  Eye Pain This is a new problem. The current episode started more than 2 days ago. The problem occurs constantly. The problem has been gradually improving. Associated symptoms include headaches. Exacerbated by: light. Relieved by: being in a dark room. Treatments tried: tobrex. The treatment provided mild relief.    Past Medical History  Diagnosis Date  . Hidradenitis   . Asthma     prn inhaler  . GERD (gastroesophageal reflux disease)   . Diabetes mellitus     IDDM  . Headache     migraines    Past Surgical History  Procedure Date  . Tonsillectomy   .  Inguinal hidradenitis excision 07/06/2010    bilateral  . Exploratory laparotomy 08/14/2005    lysis of adhesions, drainage of tubo-ovarian abscess  . Breast reduction surgery 2002  . Appendectomy 1995  . Cholecystectomy 1993  . Tubal ligation 1996  . Vaginal hysterectomy 08/04/2005    and cysto  . Hydradenitis excision 02/08/2011    Procedure: EXCISION HYDRADENITIS GROIN;  Surgeon: [name], MD;  Location: [place] [facility];  Service: General;  Laterality: N/A;  Excisioin of Hidradenitis Left groin    Family History  Problem Relation Age of Onset  . Hypertension Father   . Hypertension Sister   . Hypertension Brother     History  Substance Use Topics  . Smoking status: Current Everyday Smoker -- 0.2 packs/day for 10 years    Types: Cigarettes  . Smokeless tobacco: Never Used   Comment: 1 pack/week  . Alcohol Use: No    OB History    Grav Para Term Preterm Abortions TAB SAB Ect Mult Living                  Review of Systems  Eyes: Positive for pain.  Neurological: Positive for headaches.    Allergies  Penicillins  Home Medications   Current Outpatient Rx  Name Route Sig Dispense Refill  . ALBUTEROL SULFATE HFA 108 (90 BASE) MCG/ACT IN AERS Inhalation Inhale 2 puffs into the lungs as needed.      [name] LIPITOR PO Oral Take 40 mg by mouth  daily. PM    . CYCLOPENTOLATE HCL 1 % OP SOLN Right Eye Place 1-2 drops into the right eye daily. May do up to twice a day 5 mL 0  . HYDROCODONE-ACETAMINOPHEN 5-325 MG PO TABS Oral Take 2 tablets by mouth every 4 (four) hours as needed for pain. 20 tablet 0  . INSULIN GLARGINE 100 UNIT/ML [place] SOLN Subcutaneous Inject 40 Units into the skin 2 (two) times daily.     [name] METFORMIN HCL PO Oral Take 500 mg by mouth daily. PM    . MOXIFLOXACIN HCL 0.5 % OP SOLN Right Eye Place 1 drop into the right eye 3 (three) times daily. 1-2 drops in affected eye every 2 hours while awake for 2 days then every 8 hours for 5 days 3 mL 0  .  PROTONIX PO Oral Take 40 mg by mouth daily. AM    . POLYETHYL GLYCOL-PROPYL GLYCOL 0.4-0.3 % OP SOLN Ophthalmic Apply 1 drop to eye 4 (four) times daily as needed. 5 mL 0    BP 128/75  Pulse 99  Temp(Src) 98 F (36.7 C) (Oral)  Resp 19  SpO2 98%  Physical Exam  Nursing note and vitals reviewed. Constitutional: She is oriented to person, place, and time. She appears well-developed and well-nourished. No distress.  HENT:  Head: Normocephalic and atraumatic.  Eyes: EOM are normal. Pupils are equal, round, and reactive to light. No foreign bodies found. Right eye exhibits no chemosis, no discharge, no exudate and no hordeolum. No foreign body present in the right eye. Right conjunctiva is injected. Right conjunctiva has no hemorrhage.  Slit lamp exam:      The right eye shows corneal abrasion and fluorescein uptake. The right eye shows no corneal flare, no corneal ulcer, no foreign body, no hyphema, no hypopyon and no anterior chamber bulge.         Bilateral Near:  20/40 ;  R Near:  20/200 ;  L Near:  20/40  Neck: Normal range of motion.  Cardiovascular: Normal rate.   Pulmonary/Chest: Effort normal.  Abdominal: She exhibits no distension.  Musculoskeletal: Normal range of motion.  Neurological: She is alert and oriented to person, place, and time.  Skin: Skin is warm and dry.  Psychiatric: She has a normal mood and affect. Her behavior is normal. Judgment and thought content normal.    ED Course  Procedures (including critical care time)  Labs Reviewed - No data to display No results found.   1. Corneal abrasion     MDM   D/w Dr. Nehemiah Massed, optho on call. Will discontinue the Tobrex, start Vigamox. Will start Cyclogyl, as this has shorter duration of action than the atropine. No prednisone. Also home with systane gel, cool compresses.   She has an opthomologist on [address] she saw last month, but cannot remember name. She states that she can followup with him on Monday.   Will have her wear dark sunglasses, cool compresses, Systane eyedrops and Norco as needed for severe pain. Discussed signs and symptoms that should prompt return to the ED. Patient agrees with plan.  Cherly Beach, MD 06/04/11 [phone]

## 2011-06-04 NOTE — ED Notes (Signed)
2 lda's related to incision left groin is not applicable to this visit

## 2011-06-04 NOTE — Discharge Instructions (Signed)
You have a scratch of the eye on the cornea (the clear part of the eye). This condition may be caused by trauma. It is a common problem for people who wear contact lenses. Proper treatment is important. No evidence of infection is noted today, but you could develop an infection called a corneal ulcer or have some retained foreign body that may or may not have been noted today in the ED. Ulcers are not only painful, but they may also scar the cornea and cause permanent damage to the eye.   Do not rub your eyes. You may use Systane gel, systane eyedrops or artifical tears as much as you want to for comfort. The Cyclogyl will help with the sensitivity to light. Tabs your ophthalmologist about how long you should use this. Wear sunglasses if lights are hurting your eyes.If you wear contact lenses, do not use them until your eye caregiver approves.  Follow-up care is necessary to be sure the corneal abrasion is healing. See your caregiver or eye specialist as suggested for followup  Go to www.goodrx.com to look up your medications. This will give you a list of where you can find your prescriptions at the most affordable prices.

## 2011-06-04 NOTE — ED Notes (Signed)
Right eye pain for one week.  Initially patient was traveling.  Patient went to an ed and received tobrex.  Patient went to eye clinic recived scripts for vigamox, pred forte, and atropine, but medicaid would not pay (patient was out of state) patient is returning home today and [facility].  Patient continued to use tobrex .  Now reports right eye vision is continued cloudiness, light sensitivity continues.  Reports redness and watery eye has resolved.

## 2011-06-04 NOTE — ED Notes (Signed)
Discharge pending secondary to dr Alphonzo Cruise speaking to the specialist.  Patient aware, patient has a conflict, needs to get teenage daughter.  Dr Alphonzo Cruise made aware.

## 2011-06-04 NOTE — ED Notes (Signed)
Equipment: tetracaine, slight lamp, eye box to bedside

## 2011-06-08 ENCOUNTER — Emergency Department (INDEPENDENT_AMBULATORY_CARE_PROVIDER_SITE_OTHER): Payer: Medicaid Other

## 2011-06-08 ENCOUNTER — Emergency Department (HOSPITAL_COMMUNITY)
Admission: EM | Admit: 2011-06-08 | Discharge: 2011-06-08 | Disposition: A | Discharge status: Home / Self Care | Payer: Medicaid Other | Source: Home / Self Care | Attending: Family Medicine | Admitting: Family Medicine

## 2011-06-08 ENCOUNTER — Encounter (HOSPITAL_COMMUNITY): Payer: Self-pay | Admitting: *Deleted

## 2011-06-08 DIAGNOSIS — M7650 Patellar tendinitis, unspecified knee: Secondary | ICD-10-CM

## 2011-06-08 DIAGNOSIS — M7651 Patellar tendinitis, right knee: Secondary | ICD-10-CM

## 2011-06-08 MED ORDER — DICLOFENAC POTASSIUM 50 MG PO TABS: 50.0000 mg | ORAL_TABLET | Freq: Three times a day (TID) | ORAL | Status: DC

## 2011-06-08 NOTE — Discharge Instructions (Signed)
Wear knee brace and use ice pack and medicine 3 times a day, see orthopedist if further problems.

## 2011-06-08 NOTE — ED Notes (Signed)
Pt    Reports  Symptoms  Of  r  Knee  Pain      X  2  Days  Pain is  intermittant  She  denys  Any   Injury  She  Ambulated  To   Exam   Room       With  A  Slow          She  Reports  Her  Glucose  Was  Elevated  Earlier  Today  Unsure  As  To  Why as  She  Is  Taking her meds

## 2011-06-08 NOTE — ED Provider Notes (Signed)
History     CSN: [id number]  Arrival date & time 06/08/11  1601   First MD Initiated Contact with Patient 06/08/11 1620      Chief Complaint  Patient presents with  . Knee Pain    (Consider location/radiation/quality/duration/timing/severity/associated sxs/prior treatment) Patient is a 41 y.o. female presenting with knee pain. The history is provided by the patient.  Knee Pain This is a new problem. The current episode started 2 days ago. The problem has not changed (no pain at present, just intermittent sharp pains.) since onset.The symptoms are aggravated by walking.    Past Medical History  Diagnosis Date  . Hidradenitis   . Asthma     prn inhaler  . GERD (gastroesophageal reflux disease)   . Diabetes mellitus     IDDM  . Headache     migraines    Past Surgical History  Procedure Date  . Tonsillectomy   . Inguinal hidradenitis excision 07/06/2010    bilateral  . Exploratory laparotomy 08/14/2005    lysis of adhesions, drainage of tubo-ovarian abscess  . Breast reduction surgery 2002  . Appendectomy 1995  . Cholecystectomy 1993  . Tubal ligation 1996  . Vaginal hysterectomy 08/04/2005    and cysto  . Hydradenitis excision 02/08/2011    Procedure: EXCISION HYDRADENITIS GROIN;  Surgeon: [name], MD;  Location: [place];  Service: General;  Laterality: N/A;  Excisioin of Hidradenitis Left groin    Family History  Problem Relation Age of Onset  . Hypertension Father   . Hypertension Sister   . Hypertension Brother     History  Substance Use Topics  . Smoking status: Current Everyday Smoker -- 0.2 packs/day for 10 years    Types: Cigarettes  . Smokeless tobacco: Never Used   Comment: 1 pack/week  . Alcohol Use: No    OB History    Grav Para Term Preterm Abortions TAB SAB Ect Mult Living                  Review of Systems  Constitutional: Negative.   Musculoskeletal: Negative for joint swelling and gait problem.  Skin:  Negative for rash.    Allergies  Penicillins  Home Medications   Current Outpatient Rx  Name Route Sig Dispense Refill  . ALBUTEROL SULFATE HFA 108 (90 BASE) MCG/ACT IN AERS Inhalation Inhale 2 puffs into the lungs as needed.      [name] LIPITOR PO Oral Take 40 mg by mouth daily. PM    . CYCLOPENTOLATE HCL 1 % OP SOLN Right Eye Place 1-2 drops into the right eye daily. May do up to twice a day 5 mL 0  . DICLOFENAC POTASSIUM 50 MG PO TABS Oral Take 1 tablet (50 mg total) by mouth 3 (three) times daily. 30 tablet 0  . HYDROCODONE-ACETAMINOPHEN 5-325 MG PO TABS Oral Take 2 tablets by mouth every 4 (four) hours as needed for pain. 20 tablet 0  . INSULIN GLARGINE 100 UNIT/ML [place] SOLN Subcutaneous Inject 40 Units into the skin 2 (two) times daily.     [name] METFORMIN HCL PO Oral Take 500 mg by mouth daily. PM    . MOXIFLOXACIN HCL 0.5 % OP SOLN Right Eye Place 1 drop into the right eye 3 (three) times daily. 1-2 drops in affected eye every 2 hours while awake for 2 days then every 8 hours for 5 days 3 mL 0  . PROTONIX PO Oral Take 40 mg by mouth daily. AM    .  POLYETHYL GLYCOL-PROPYL GLYCOL 0.4-0.3 % OP SOLN Ophthalmic Apply 1 drop to eye 4 (four) times daily as needed. 5 mL 0    BP 142/88  Pulse 78  Temp(Src) 99.8 F (37.7 C) (Oral)  Resp 18  SpO2 100%  Physical Exam  Nursing note and vitals reviewed. Constitutional: She is oriented to person, place, and time. She appears well-developed and well-nourished.  Musculoskeletal: She exhibits no edema and no tenderness.       Right knee: She exhibits normal range of motion, no swelling, no effusion, no ecchymosis, no deformity, no erythema, normal alignment, normal patellar mobility and no bony tenderness. no tenderness found.  Neurological: She is alert and oriented to person, place, and time.    ED Course  Procedures (including critical care time)  Labs Reviewed - No data to display Dg Knee Complete 4 Views Right  06/08/2011  *RADIOLOGY  REPORT*  Clinical Data: 41 year old female with right knee pain.  RIGHT KNEE - COMPLETE 4+ VIEW  Comparison: None.  Findings: No joint effusion.  Relatively preserved joint spaces. Patella intact. Bone mineralization is within normal limits.  No fracture or dislocation.  IMPRESSION: No acute osseous abnormality identified about the right knee.  Original Report Authenticated By: Randall An, M.D.     1. Patellar tendonitis, right       MDM  X-rays reviewed and report per radiologist.         Billy Fischer, MD 06/08/11 [phone]

## 2011-07-26 ENCOUNTER — Telehealth: Payer: Self-pay | Admitting: Obstetrics and Gynecology

## 2011-09-28 ENCOUNTER — Telehealth: Payer: Self-pay

## 2011-09-28 NOTE — Telephone Encounter (Signed)
Pt seen in office for PE, she

## 2011-10-29 ENCOUNTER — Encounter (HOSPITAL_COMMUNITY): Payer: Self-pay | Admitting: Emergency Medicine

## 2011-10-29 ENCOUNTER — Emergency Department (HOSPITAL_COMMUNITY)
Admission: EM | Admit: 2011-10-29 | Discharge: 2011-10-29 | Disposition: A | Discharge status: Home / Self Care | Payer: Self-pay | Attending: Emergency Medicine | Admitting: Emergency Medicine

## 2011-10-29 DIAGNOSIS — K612 Anorectal abscess: Secondary | ICD-10-CM

## 2011-10-29 DIAGNOSIS — J45909 Unspecified asthma, uncomplicated: Secondary | ICD-10-CM | POA: Insufficient documentation

## 2011-10-29 DIAGNOSIS — L03317 Cellulitis of buttock: Secondary | ICD-10-CM | POA: Insufficient documentation

## 2011-10-29 DIAGNOSIS — E119 Type 2 diabetes mellitus without complications: Secondary | ICD-10-CM | POA: Insufficient documentation

## 2011-10-29 DIAGNOSIS — Z880 Allergy status to penicillin: Secondary | ICD-10-CM | POA: Insufficient documentation

## 2011-10-29 DIAGNOSIS — L732 Hidradenitis suppurativa: Secondary | ICD-10-CM | POA: Insufficient documentation

## 2011-10-29 DIAGNOSIS — K219 Gastro-esophageal reflux disease without esophagitis: Secondary | ICD-10-CM | POA: Insufficient documentation

## 2011-10-29 DIAGNOSIS — F17200 Nicotine dependence, unspecified, uncomplicated: Secondary | ICD-10-CM | POA: Insufficient documentation

## 2011-10-29 DIAGNOSIS — L0231 Cutaneous abscess of buttock: Secondary | ICD-10-CM | POA: Insufficient documentation

## 2011-10-29 LAB — GLUCOSE, CAPILLARY: Glucose-Capillary: 364 mg/dL — ABNORMAL HIGH (ref 70–99)

## 2011-10-29 MED ORDER — HYDROCODONE-ACETAMINOPHEN 5-325 MG PO TABS
2.0000 | ORAL_TABLET | Freq: Once | ORAL | Status: AC
  Administered 2011-10-29: 2 via ORAL
  Filled 2011-10-29: qty 2

## 2011-10-29 MED ORDER — LORAZEPAM 2 MG/ML IJ SOLN
1.0000 mg | Freq: Once | INTRAMUSCULAR | Status: AC
  Administered 2011-10-29: 1 mg via INTRAVENOUS
  Filled 2011-10-29: qty 1

## 2011-10-29 MED ORDER — FENTANYL CITRATE 0.05 MG/ML IJ SOLN
100.0000 ug | Freq: Once | INTRAMUSCULAR | Status: AC
  Administered 2011-10-29: 100 ug via INTRAVENOUS
  Filled 2011-10-29: qty 2

## 2011-10-29 MED ORDER — HYDROCODONE-ACETAMINOPHEN 5-325 MG PO TABS: 1.0000 | ORAL_TABLET | Freq: Four times a day (QID) | ORAL | Status: DC | PRN

## 2011-10-29 MED ORDER — LIDOCAINE-EPINEPHRINE 2 %-1:100000 IJ SOLN
20.0000 mL | Freq: Once | INTRAMUSCULAR | Status: DC
  Filled 2011-10-29: qty 20

## 2011-10-29 MED ORDER — SULFAMETHOXAZOLE-TRIMETHOPRIM 800-160 MG PO TABS: 1.0000 | ORAL_TABLET | Freq: Two times a day (BID) | ORAL | Status: DC

## 2011-10-29 NOTE — ED Notes (Signed)
C/o abscess on buttocks x 3 days.

## 2011-10-29 NOTE — ED Provider Notes (Signed)
Pt requests anxiolysis before I&D; do not feel needs PSA in ED or admit.  Babette Relic, MD 10/30/11 [phone]

## 2011-10-29 NOTE — ED Notes (Signed)
PT. TRANSPORTED [place] NEPHEW .

## 2011-10-29 NOTE — ED Notes (Addendum)
BS noted to be 364 mg/dL at a time of 1951 hours

## 2011-10-29 NOTE — ED Provider Notes (Signed)
History     CSN: [id number]  Arrival date & time 10/29/11  1944   None     Chief Complaint  Patient presents with  . Abscess    (Consider location/radiation/quality/duration/timing/severity/associated sxs/prior treatment) Patient is a 41 y.o. female presenting with abscess. The history is provided by the patient.  Abscess  This is a recurrent problem. The current episode started yesterday. The problem occurs rarely. The problem has been gradually worsening. The abscess is present on the right buttock. The problem is moderate. The abscess is characterized by redness, painfulness and swelling. It is unknown what she was exposed to. The abscess first occurred at home. Pertinent negatives include no anorexia, no decrease in physical activity, not sleeping less, not drinking less, no fever, no fussiness, not sleeping more, no diarrhea, no vomiting, no congestion, no rhinorrhea, no sore throat, no decreased responsiveness and no cough. Past medical history comments: Pt with prior abscess in groin. There were no sick contacts. She has received no recent medical care.    Past Medical History  Diagnosis Date  . Hidradenitis   . Asthma     prn inhaler  . GERD (gastroesophageal reflux disease)   . Diabetes mellitus     IDDM  . Headache     migraines    Past Surgical History  Procedure Date  . Tonsillectomy   . Inguinal hidradenitis excision 07/06/2010    bilateral  . Exploratory laparotomy 08/14/2005    lysis of adhesions, drainage of tubo-ovarian abscess  . Breast reduction surgery 2002  . Appendectomy 1995  . Cholecystectomy 1993  . Tubal ligation 1996  . Vaginal hysterectomy 08/04/2005    and cysto  . Hydradenitis excision 02/08/2011    Procedure: EXCISION HYDRADENITIS GROIN;  Surgeon: [name], MD;  Location: [facility];  Service: General;  Laterality: N/A;  Excisioin of Hidradenitis Left groin    Family History  Problem Relation Age of Onset  .  Hypertension Father   . Hypertension Sister   . Hypertension Brother     History  Substance Use Topics  . Smoking status: Current Every Day Smoker -- 0.2 packs/day for 10 years    Types: Cigarettes  . Smokeless tobacco: Never Used   Comment: 1 pack/week  . Alcohol Use: No    OB History    Grav Para Term Preterm Abortions TAB SAB Ect Mult Living                  Review of Systems  Constitutional: Negative for fever and decreased responsiveness.  HENT: Negative for congestion, sore throat and rhinorrhea.   Respiratory: Negative for cough and shortness of breath.   Cardiovascular: Negative for chest pain.  Gastrointestinal: Negative for nausea, vomiting, abdominal pain, diarrhea and anorexia.  Neurological: Negative for headaches.  All other systems reviewed and are negative.    Allergies  Penicillins  Home Medications   Current Outpatient Rx  Name Route Sig Dispense Refill  . ACETAMINOPHEN 500 MG PO TABS Oral Take 1,000 mg by mouth every 6 (six) hours as needed. For pain.    . INSULIN GLARGINE 100 UNIT/ML [place] SOLN Subcutaneous Inject 40 Units into the skin 2 (two) times daily.     [name] METFORMIN HCL 500 MG PO TABS Oral Take 500 mg by mouth 2 (two) times daily with a meal.      BP 152/80  Pulse 123  Temp 98.4 F (36.9 C) (Oral)  Resp 20  SpO2 99%  Physical  Exam  Nursing note and vitals reviewed. Constitutional: She is oriented to person, place, and time. She appears well-developed and well-nourished. No distress.  HENT:  Head: Normocephalic and atraumatic.  Eyes: EOM are normal. Pupils are equal, round, and reactive to light.  Neck: Normal range of motion.  Cardiovascular: Normal rate and normal heart sounds.   Pulmonary/Chest: Effort normal and breath sounds normal. No respiratory distress.  Abdominal: Soft. She exhibits no distension. There is no tenderness.  Genitourinary:     Musculoskeletal: Normal range of motion.  Neurological: She is alert and  oriented to person, place, and time.  Skin: Skin is warm and dry.    ED Course  INCISION AND DRAINAGE Date/Time: 10/29/2011 10:37 PM Performed by: Tania Ade Authorized by: Maurine Cane E Consent: Verbal consent obtained. Risks and benefits: risks, benefits and alternatives were discussed Consent given by: patient Patient identity confirmed: verbally with patient Time out: Immediately prior to procedure a "time out" was called to verify the correct patient, procedure, equipment, support staff and site/side marked as required. Type: abscess Body area: anogenital Location details: gluteal cleft Anesthesia: local infiltration Local anesthetic: lidocaine 2% with epinephrine Anesthetic total: 2 ml Patient sedated: no Scalpel size: 11 Incision type: single straight Complexity: simple Drainage: purulent and serosanguinous Drainage amount: moderate Wound treatment: wound left open Patient tolerance: Patient tolerated the procedure well with no immediate complications.   (including critical care time)  Labs Reviewed  GLUCOSE, CAPILLARY - Abnormal; Notable for the following:    Glucose-Capillary 364 (*)     All other components within normal limits   No results found.   1. Abscess of anal and rectal regions       MDM  8:56 PM Pt seen and examined. Pt with two days of increasing pain from buttock abscess. Pt offered drainage, but now is refusing as she is worried about the pain from the lidocaine. Will treat with Bactrim.  9:26 PM Pt now willing to have procedure done with anxiolysis with ativan and pain control with fentanyl. Will do procedure.  Drainage performed without issue. Will discharge to follow up in two days and will still do antibiotics. Pt advised to do sitz baths as well.   Tania Ade, MD 10/29/11 [phone]

## 2011-10-29 NOTE — ED Notes (Signed)
PT. NOT READY FOR DISCHARGE AT THIS TIME - SLEEPY/DROWSY FROM HER MEDICATIONS.

## 2011-10-30 NOTE — ED Provider Notes (Signed)
I saw and evaluated the patient, reviewed the resident's note and I agree with the findings and plan (immediately available during I&D).  Babette Relic, MD 10/30/11 [phone]

## 2011-12-17 ENCOUNTER — Emergency Department (HOSPITAL_COMMUNITY)
Admission: EM | Admit: 2011-12-17 | Discharge: 2011-12-17 | Disposition: A | Discharge status: Home / Self Care | Payer: BC Managed Care – PPO | Source: Home / Self Care | Attending: Family Medicine | Admitting: Family Medicine

## 2011-12-17 ENCOUNTER — Encounter (HOSPITAL_COMMUNITY): Payer: Self-pay | Admitting: Emergency Medicine

## 2011-12-17 DIAGNOSIS — J40 Bronchitis, not specified as acute or chronic: Secondary | ICD-10-CM

## 2011-12-17 MED ORDER — PREDNISONE 20 MG PO TABS: ORAL_TABLET | ORAL | Status: DC

## 2011-12-17 MED ORDER — ALBUTEROL SULFATE HFA 108 (90 BASE) MCG/ACT IN AERS: 1.0000 | INHALATION_SPRAY | Freq: Four times a day (QID) | RESPIRATORY_TRACT | Status: DC | PRN

## 2011-12-17 MED ORDER — GUAIFENESIN-CODEINE 100-10 MG/5ML PO SYRP: 5.0000 mL | ORAL_SOLUTION | Freq: Three times a day (TID) | ORAL | Status: DC | PRN

## 2011-12-17 NOTE — ED Notes (Signed)
Provided work note

## 2011-12-17 NOTE — ED Notes (Signed)
Reports cough, congestion and wheezing that started last night.  Denies fever.  Non - productive cough

## 2011-12-17 NOTE — ED Provider Notes (Signed)
History     CSN: [id number]  Arrival date & time 12/17/11  1711   First MD Initiated Contact with Patient 12/17/11 1845      Chief Complaint  Patient presents with  . Cough    (Consider location/radiation/quality/duration/timing/severity/associated sxs/prior treatment) HPI Comments: 41 year old female smoker with history of diabetes and "asthma". Here complaining of nonproductive cough that is persistent, nasal congestion and wheezing. Denies fever or chills. No chest pain. Cough is making her feel tired. No headache, abdominal pain, nausea vomiting or diarrhea.   Past Medical History  Diagnosis Date  . Hidradenitis   . Asthma     prn inhaler  . GERD (gastroesophageal reflux disease)   . Diabetes mellitus     IDDM  . Headache     migraines    Past Surgical History  Procedure Date  . Tonsillectomy   . Inguinal hidradenitis excision 07/06/2010    bilateral  . Exploratory laparotomy 08/14/2005    lysis of adhesions, drainage of tubo-ovarian abscess  . Breast reduction surgery 2002  . Appendectomy 1995  . Cholecystectomy 1993  . Tubal ligation 1996  . Vaginal hysterectomy 08/04/2005    and cysto  . Hydradenitis excision 02/08/2011    Procedure: EXCISION HYDRADENITIS GROIN;  Surgeon: [name], MD;  Location: [facility];  Service: General;  Laterality: N/A;  Excisioin of Hidradenitis Left groin    Family History  Problem Relation Age of Onset  . Hypertension Father   . Hypertension Sister   . Hypertension Brother     History  Substance Use Topics  . Smoking status: Current Every Day Smoker -- 0.2 packs/day for 10 years    Types: Cigarettes  . Smokeless tobacco: Never Used     Comment: 1 pack/week  . Alcohol Use: No    OB History    Grav Para Term Preterm Abortions TAB SAB Ect Mult Living                  Review of Systems  Constitutional: Negative for fever, chills, diaphoresis, activity change and appetite change.  HENT: Positive  for congestion and rhinorrhea.   Eyes: Negative for discharge.  Respiratory: Positive for cough and wheezing.   Cardiovascular: Negative for chest pain, palpitations and leg swelling.  Gastrointestinal: Negative for nausea, vomiting, abdominal pain and diarrhea.  Skin: Negative for rash.  Neurological: Negative for dizziness and headaches.    Allergies  Penicillins  Home Medications   Current Outpatient Rx  Name  Route  Sig  Dispense  Refill  . ACETAMINOPHEN 500 MG PO TABS   Oral   Take 1,000 mg by mouth every 6 (six) hours as needed. For pain.         . INSULIN GLARGINE 100 UNIT/ML [place] SOLN   Subcutaneous   Inject 40 Units into the skin 2 (two) times daily.          [name] METFORMIN HCL 500 MG PO TABS   Oral   Take 500 mg by mouth 2 (two) times daily with a meal.         . ALBUTEROL SULFATE HFA 108 (90 BASE) MCG/ACT IN AERS   Inhalation   Inhale 1-2 puffs into the lungs every 6 (six) hours as needed for wheezing.   1 Inhaler   0   . GUAIFENESIN-CODEINE 100-10 MG/5ML PO SYRP   Oral   Take 5 mLs by mouth 3 (three) times daily as needed for cough.   120 mL  0   . HYDROCODONE-ACETAMINOPHEN 5-325 MG PO TABS   Oral   Take 1 tablet by mouth every 6 (six) hours as needed for pain.   10 tablet   0   . PREDNISONE 20 MG PO TABS      2 tabs po daily for 5 days   10 tablet   0   . SULFAMETHOXAZOLE-TRIMETHOPRIM 800-160 MG PO TABS   Oral   Take 1 tablet by mouth 2 (two) times daily. One po bid x 7 days   14 tablet   0     BP 145/87  Pulse 90  Temp 97.8 F (36.6 C) (Oral)  Resp 20  SpO2 96%  Physical Exam  Nursing note and vitals reviewed. Constitutional: She is oriented to person, place, and time. She appears well-developed and well-nourished. No distress.  HENT:  Head: Normocephalic and atraumatic.  Right Ear: External ear normal.  Left Ear: External ear normal.       Nasal Congestion with erythema and swelling of nasal turbinates, no rhinorrhea. No  pharyngeal erythema no exudates. No uvula deviation. No trismus. TM's normal  Eyes: Conjunctivae normal are normal. Right eye exhibits no discharge. Left eye exhibits no discharge.  Neck: Neck supple. No JVD present. No thyromegaly present.  Cardiovascular: Normal rate, regular rhythm and normal heart sounds.   Pulmonary/Chest: No respiratory distress. She has no wheezes. She has no rales. She exhibits no tenderness.       Sporadic bilateral expiratory ronchi  Lymphadenopathy:    She has no cervical adenopathy.  Neurological: She is alert and oriented to person, place, and time.  Skin: No rash noted. She is not diaphoretic.    ED Course  Procedures (including critical care time)  Labs Reviewed - No data to display No results found.   1. Bronchitis       MDM  Treated with prednisone, albuterol and Robitussin-AC. Supportive care and red flags that should prompt her return to medical attention discussed with patient and provided in writing.        Randa Spike, MD 12/19/11 [phone]

## 2012-01-07 ENCOUNTER — Encounter (HOSPITAL_COMMUNITY): Payer: Self-pay | Admitting: Nurse Practitioner

## 2012-01-07 ENCOUNTER — Emergency Department (HOSPITAL_COMMUNITY): Payer: BC Managed Care – PPO

## 2012-01-07 ENCOUNTER — Emergency Department (HOSPITAL_COMMUNITY)
Admission: EM | Admit: 2012-01-07 | Discharge: 2012-01-08 | Disposition: A | Discharge status: Home / Self Care | Payer: BC Managed Care – PPO | Attending: Emergency Medicine | Admitting: Emergency Medicine

## 2012-01-07 DIAGNOSIS — J45909 Unspecified asthma, uncomplicated: Secondary | ICD-10-CM | POA: Insufficient documentation

## 2012-01-07 DIAGNOSIS — Z79899 Other long term (current) drug therapy: Secondary | ICD-10-CM | POA: Insufficient documentation

## 2012-01-07 DIAGNOSIS — F17200 Nicotine dependence, unspecified, uncomplicated: Secondary | ICD-10-CM | POA: Insufficient documentation

## 2012-01-07 DIAGNOSIS — Z8679 Personal history of other diseases of the circulatory system: Secondary | ICD-10-CM | POA: Insufficient documentation

## 2012-01-07 DIAGNOSIS — Z8719 Personal history of other diseases of the digestive system: Secondary | ICD-10-CM | POA: Insufficient documentation

## 2012-01-07 DIAGNOSIS — Z794 Long term (current) use of insulin: Secondary | ICD-10-CM | POA: Insufficient documentation

## 2012-01-07 DIAGNOSIS — R739 Hyperglycemia, unspecified: Secondary | ICD-10-CM

## 2012-01-07 DIAGNOSIS — E1169 Type 2 diabetes mellitus with other specified complication: Secondary | ICD-10-CM | POA: Insufficient documentation

## 2012-01-07 LAB — CBC WITH DIFFERENTIAL/PLATELET
Basophils Absolute: 0 10*3/uL (ref 0.0–0.1)
Basophils Relative: 1 % (ref 0–1)
Eosinophils Absolute: 0.1 10*3/uL (ref 0.0–0.7)
HCT: 38.3 % (ref 36.0–46.0)
MCH: 25.7 pg — ABNORMAL LOW (ref 26.0–34.0)
MCHC: 32.6 g/dL (ref 30.0–36.0)
Monocytes Absolute: 0.5 10*3/uL (ref 0.1–1.0)
Neutro Abs: 2.1 10*3/uL (ref 1.7–7.7)
RDW: 13.1 % (ref 11.5–15.5)

## 2012-01-07 LAB — BASIC METABOLIC PANEL
BUN: 13 mg/dL (ref 6–23)
Chloride: 93 mEq/L — ABNORMAL LOW (ref 96–112)
Creatinine, Ser: 0.7 mg/dL (ref 0.50–1.10)
GFR calc Af Amer: >90 mL/min (ref >90)
GFR calc non Af Amer: >90 mL/min (ref >90)
Glucose, Bld: 535 mg/dL — ABNORMAL HIGH (ref 70–99)

## 2012-01-07 LAB — GLUCOSE, CAPILLARY
Glucose-Capillary: 549 mg/dL — ABNORMAL HIGH (ref 70–99)
Glucose-Capillary: 414 mg/dL — ABNORMAL HIGH (ref 70–99)

## 2012-01-07 MED ORDER — SODIUM CHLORIDE 0.9 % IV BOLUS (SEPSIS)
2000.0000 mL | Freq: Once | INTRAVENOUS | Status: AC
  Administered 2012-01-07: 2000 mL via INTRAVENOUS

## 2012-01-07 MED ORDER — HYDROMORPHONE HCL PF 1 MG/ML IJ SOLN
1.0000 mg | Freq: Once | INTRAMUSCULAR | Status: AC
  Administered 2012-01-07: 1 mg via INTRAVENOUS
  Filled 2012-01-07: qty 1

## 2012-01-07 MED ORDER — SODIUM CHLORIDE 0.9 % IV SOLN
INTRAVENOUS | Status: DC
  Administered 2012-01-08: 3.2 [IU]/h via INTRAVENOUS
  Filled 2012-01-07: qty 1

## 2012-01-07 MED ORDER — INSULIN REGULAR BOLUS VIA INFUSION
0.0000 [IU] | Freq: Three times a day (TID) | INTRAVENOUS | Status: DC
  Filled 2012-01-07: qty 10

## 2012-01-07 MED ORDER — DEXTROSE-NACL 5-0.45 % IV SOLN: INTRAVENOUS | Status: DC

## 2012-01-07 MED ORDER — DEXTROSE 50 % IV SOLN: 25.0000 mL | INTRAVENOUS | Status: DC | PRN

## 2012-01-07 MED ORDER — ONDANSETRON HCL 4 MG/2ML IJ SOLN
4.0000 mg | Freq: Once | INTRAMUSCULAR | Status: AC
  Administered 2012-01-07: 4 mg via INTRAVENOUS
  Filled 2012-01-07: qty 2

## 2012-01-07 MED ORDER — SODIUM CHLORIDE 0.9 % IV SOLN
INTRAVENOUS | Status: DC
  Administered 2012-01-08: via INTRAVENOUS

## 2012-01-07 NOTE — ED Notes (Signed)
[id number]

## 2012-01-07 NOTE — ED Notes (Signed)
Pt c/o body aches, nonproductive cough, and elevated blood glucose at home over past 2 weeks. A&Ox4, resp e/u

## 2012-01-07 NOTE — ED Provider Notes (Addendum)
History     CSN: [id number]  Arrival date & time 01/07/12  1734   First MD Initiated Contact with Patient 01/07/12 2148      Chief Complaint  Patient presents with  . Generalized Body Aches    (Consider location/radiation/quality/duration/timing/severity/associated sxs/prior treatment) HPI Comments: Susan Horton is a 41 y.o. Female who is here for evaluation of myalgias, and cough. She's been sick for 2 days. Take off several weeks ago, and was seen at an urgent care. She's not currently have a PCP. She currently takes insulin, and metformin for hyperglycemia. She denies nausea, vomiting, chest pain, or shortness of breath. There are no known modifying factors. She, states that she is taking her medicines as usual.  The history is provided by the patient.    Past Medical History  Diagnosis Date  . Hidradenitis   . Asthma     prn inhaler  . GERD (gastroesophageal reflux disease)   . Diabetes mellitus     IDDM  . Headache     migraines    Past Surgical History  Procedure Date  . Tonsillectomy   . Inguinal hidradenitis excision 07/06/2010    bilateral  . Exploratory laparotomy 08/14/2005    lysis of adhesions, drainage of tubo-ovarian abscess  . Breast reduction surgery 2002  . Appendectomy 1995  . Cholecystectomy 1993  . Tubal ligation 1996  . Vaginal hysterectomy 08/04/2005    and cysto  . Hydradenitis excision 02/08/2011    Procedure: EXCISION HYDRADENITIS GROIN;  Surgeon: [name], MD;  Location: [facility];  Service: General;  Laterality: N/A;  Excisioin of Hidradenitis Left groin    Family History  Problem Relation Age of Onset  . Hypertension Father   . Hypertension Sister   . Hypertension Brother     History  Substance Use Topics  . Smoking status: Current Every Day Smoker -- 0.2 packs/day for 10 years    Types: Cigarettes  . Smokeless tobacco: Never Used     Comment: 1 pack/week  . Alcohol Use: No    OB History    Grav Para  Term Preterm Abortions TAB SAB Ect Mult Living                  Review of Systems  All other systems reviewed and are negative.    Allergies  Penicillins  Home Medications   Current Outpatient Rx  Name  Route  Sig  Dispense  Refill  . ACETAMINOPHEN 500 MG PO TABS   Oral   Take 1,000 mg by mouth every 6 (six) hours as needed. For pain.         . ALBUTEROL SULFATE HFA 108 (90 BASE) MCG/ACT IN AERS   Inhalation   Inhale 1-2 puffs into the lungs every 6 (six) hours as needed for wheezing.   1 Inhaler   0   . GUAIFENESIN-CODEINE 100-10 MG/5ML PO SYRP   Oral   Take 5 mLs by mouth 3 (three) times daily as needed for cough.   120 mL   0   . HYDROCODONE-ACETAMINOPHEN 5-325 MG PO TABS   Oral   Take 1 tablet by mouth every 6 (six) hours as needed for pain.   10 tablet   0   . INSULIN GLARGINE 100 UNIT/ML [place] SOLN   Subcutaneous   Inject 40 Units into the skin 2 (two) times daily.          [name] METFORMIN HCL 500 MG PO TABS  Oral   Take 500 mg by mouth 2 (two) times daily with a meal.           BP 139/75  Pulse 80  Temp 98.4 F (36.9 C) (Oral)  Resp 15  Ht 5\' 5"  (1.651 m)  Wt 179 lb (81.194 kg)  BMI 29.79 kg/m2  SpO2 98%  Physical Exam  Nursing note and vitals reviewed. Constitutional: She is oriented to person, place, and time. She appears well-developed and well-nourished.  HENT:  Head: Normocephalic and atraumatic.  Eyes: Conjunctivae normal and EOM are normal. Pupils are equal, round, and reactive to light.  Neck: Normal range of motion and phonation normal. Neck supple.  Cardiovascular: Normal rate, regular rhythm and intact distal pulses.   Pulmonary/Chest: Effort normal and breath sounds normal. No respiratory distress. She has no wheezes. She exhibits no tenderness.  Abdominal: Soft. She exhibits no distension. There is no tenderness. There is no guarding.  Musculoskeletal: Normal range of motion.  Neurological: She is alert and oriented to person,  place, and time. She has normal strength. She exhibits normal muscle tone.  Skin: Skin is warm and dry.  Psychiatric: She has a normal mood and affect. Her behavior is normal. Judgment and thought content normal.    ED Course  Procedures (including critical care time)  Placed on droplet precautions- 21:55  IV fluids, ordered  Insulin per glucose stabilizer, ordered   She will be observed for complicating factors    Labs Reviewed  GLUCOSE, CAPILLARY - Abnormal; Notable for the following:    Glucose-Capillary 549 (*)     All other components within normal limits  BASIC METABOLIC PANEL - Abnormal; Notable for the following:    Sodium 130 (*)     Chloride 93 (*)     Glucose, Bld 535 (*)     All other components within normal limits  CBC WITH DIFFERENTIAL - Abnormal; Notable for the following:    MCH 25.7 (*)     Neutrophils Relative 36 (*)     Lymphocytes Relative 53 (*)     All other components within normal limits  URINALYSIS, ROUTINE W REFLEX MICROSCOPIC - Abnormal; Notable for the following:    Specific Gravity, Urine 1.031 (*)     Glucose, UA >1000 (*)     Hgb urine dipstick SMALL (*)     All other components within normal limits  GLUCOSE, CAPILLARY - Abnormal; Notable for the following:    Glucose-Capillary 414 (*)     All other components within normal limits  GLUCOSE, CAPILLARY - Abnormal; Notable for the following:    Glucose-Capillary 379 (*)     All other components within normal limits  URINE MICROSCOPIC-ADD ON - Abnormal; Notable for the following:    Squamous Epithelial / LPF MANY (*)     All other components within normal limits  INFLUENZA PANEL BY PCR  URINE CULTURE   Dg Chest 2 View  01/07/2012  *RADIOLOGY REPORT*  Clinical Data: Chest pain and shortness of breath  CHEST - 2 VIEW  Comparison: 12/21/2010  Findings: The lungs are clear without focal consolidation, edema, effusion or pneumothorax.  Cardiopericardial silhouette is within normal limits for  size.  Imaged bony structures of the thorax are intact.  IMPRESSION: Normal exam.   Original Report Authenticated By: Misty Stanley, M.D.      1. Hyperglycemia       MDM  Hyperglycemia, without clear cause.  Treatment initiated with IV fluids, and glucose stabilizer.  Patient  is  being evaluated for serious infections    Care to Dr. Wetzel Bjornstad to disposition after treatment, labs, returned.     Richarda Blade, MD 01/08/12 [phone]   People at High Risk of Developing Flu-Related Complications Most people who get the flu will have mild illness, will not need medical care or antiviral drugs, and will recover in less than two weeks. Some people, however, are more likely to get flu complications that result in being hospitalized and occasionally result in death. Pneumonia, bronchitis, sinus infections and ear infections are examples of flu-related complications. The flu also can make chronic health problems worse. For example, people with asthma may experience asthma attacks while they have the flu, and people with chronic congestive heart failure may experience a worsening of this condition that is triggered by the flu. The list below includes the groups of people more likely to get flu-related complications if they get sick from influenza.  People at [place] for Developing Flu-Related Complications .Children younger than 72, but especially children younger than 46 years old .Adults 61 years of age and older .Pregnant women .Also, American Indians and [place] Natives  seem to be at higher risk of flu complications People who have medical conditions including: .Asthma .Neurological and neurodevelopmental conditions [including disorders of the brain, spinal cord, peripheral nerve, and muscle such as cerebral palsy, epilepsy (seizure disorders), stroke, intellectual disability (mental retardation), moderate to severe developmental delay, muscular dystrophy, or spinal cord injury]. .Chronic  lung disease (such as chronic obstructive pulmonary disease [COPD] and cystic fibrosis) .Heart disease (such as congenital heart disease, congestive heart failure and coronary artery disease) .Blood disorders (such as sickle cell disease) .Endocrine disorders (such as diabetes mellitus) .Kidney disorders .Liver disorders .Metabolic disorders (such as inherited metabolic disorders and mitochondrial disorders) .Weakened immune system due to disease or medication (such as people with HIV or AIDS, or cancer, or those on chronic steroids) .People younger than 41 years of age who are receiving long-term aspirin therapy .People who are morbidly obese (Body Mass Index, or BMI, of 40 or greater) Note: There is no recommendation for pregnant women or people with pre-existing medical conditions to seek special permission or secure written consent from their doctor for influenza vaccination if they get vaccinated at a worksite clinic, pharmacy or other location outside of their physician's office.    Richarda Blade, MD 01/08/12 [phone]

## 2012-01-08 LAB — URINALYSIS, ROUTINE W REFLEX MICROSCOPIC
Bilirubin Urine: NEGATIVE
Ketones, ur: NEGATIVE mg/dL
Nitrite: NEGATIVE
Protein, ur: NEGATIVE mg/dL
Specific Gravity, Urine: 1.031 — ABNORMAL HIGH (ref 1.005–1.030)
Urobilinogen, UA: 1 mg/dL (ref 0.0–1.0)

## 2012-01-08 LAB — GLUCOSE, CAPILLARY
Glucose-Capillary: 255 mg/dL — ABNORMAL HIGH (ref 70–99)
Glucose-Capillary: 379 mg/dL — ABNORMAL HIGH (ref 70–99)

## 2012-01-08 LAB — INFLUENZA PANEL BY PCR (TYPE A & B): Influenza B By PCR: NEGATIVE

## 2012-01-08 LAB — URINE MICROSCOPIC-ADD ON

## 2012-01-08 MED ORDER — INSULIN GLARGINE 100 UNIT/ML ~~LOC~~ SOLN: 40.0000 [IU] | Freq: Two times a day (BID) | SUBCUTANEOUS | Status: DC

## 2012-01-08 MED ORDER — OSELTAMIVIR PHOSPHATE 75 MG PO CAPS: 75.0000 mg | ORAL_CAPSULE | Freq: Two times a day (BID) | ORAL | Status: DC

## 2012-01-08 MED ORDER — INSULIN ASPART 100 UNIT/ML ~~LOC~~ SOLN: 3.0000 [IU] | Freq: Once | SUBCUTANEOUS | Status: DC

## 2012-01-08 NOTE — ED Notes (Signed)
cbg 255 and insulin drip increased to 5.9 units/hour per glucose stabilizer

## 2012-01-08 NOTE — ED Notes (Signed)
cbg 371 - insulin drip increased to 6.2 units /hour glucose stabilizer

## 2012-01-08 NOTE — ED Notes (Signed)
Call to lab re: flu pcr results and results will not be available until the morning, EDP aware

## 2012-01-08 NOTE — ED Notes (Signed)
CBG 274 - insulin drip decreased to 4.3 units/hour per glucose stabilizer

## 2012-01-08 NOTE — ED Provider Notes (Signed)
hyperglycemia improved.  No dka.  Pt improved.  Will dc to [facility],  Ret new/worsening sxs  Bradleigh Sonnen Ferne Reus, MD 01/08/12 [phone]

## 2012-01-09 LAB — URINE CULTURE

## 2012-03-16 ENCOUNTER — Encounter (HOSPITAL_COMMUNITY): Payer: Self-pay | Admitting: *Deleted

## 2012-03-16 ENCOUNTER — Emergency Department (HOSPITAL_COMMUNITY)
Admission: EM | Admit: 2012-03-16 | Discharge: 2012-03-16 | Disposition: A | Discharge status: Nursing Home (Includes ICF) | Payer: BC Managed Care – PPO | Source: Home / Self Care | Attending: Emergency Medicine | Admitting: Emergency Medicine

## 2012-03-16 ENCOUNTER — Emergency Department (HOSPITAL_COMMUNITY): Payer: BC Managed Care – PPO

## 2012-03-16 ENCOUNTER — Encounter (HOSPITAL_COMMUNITY): Payer: Self-pay | Admitting: Nurse Practitioner

## 2012-03-16 ENCOUNTER — Emergency Department (HOSPITAL_COMMUNITY)
Admission: EM | Admit: 2012-03-16 | Discharge: 2012-03-16 | Disposition: A | Discharge status: Home / Self Care | Payer: BC Managed Care – PPO | Attending: Emergency Medicine | Admitting: Emergency Medicine

## 2012-03-16 DIAGNOSIS — Z794 Long term (current) use of insulin: Secondary | ICD-10-CM | POA: Insufficient documentation

## 2012-03-16 DIAGNOSIS — K5289 Other specified noninfective gastroenteritis and colitis: Secondary | ICD-10-CM

## 2012-03-16 DIAGNOSIS — E119 Type 2 diabetes mellitus without complications: Secondary | ICD-10-CM

## 2012-03-16 DIAGNOSIS — F17200 Nicotine dependence, unspecified, uncomplicated: Secondary | ICD-10-CM | POA: Insufficient documentation

## 2012-03-16 DIAGNOSIS — J45909 Unspecified asthma, uncomplicated: Secondary | ICD-10-CM | POA: Insufficient documentation

## 2012-03-16 DIAGNOSIS — K529 Noninfective gastroenteritis and colitis, unspecified: Secondary | ICD-10-CM

## 2012-03-16 DIAGNOSIS — Z872 Personal history of diseases of the skin and subcutaneous tissue: Secondary | ICD-10-CM | POA: Insufficient documentation

## 2012-03-16 DIAGNOSIS — R5381 Other malaise: Secondary | ICD-10-CM | POA: Insufficient documentation

## 2012-03-16 DIAGNOSIS — R1084 Generalized abdominal pain: Secondary | ICD-10-CM | POA: Insufficient documentation

## 2012-03-16 DIAGNOSIS — E1165 Type 2 diabetes mellitus with hyperglycemia: Secondary | ICD-10-CM

## 2012-03-16 DIAGNOSIS — R112 Nausea with vomiting, unspecified: Secondary | ICD-10-CM

## 2012-03-16 DIAGNOSIS — R5383 Other fatigue: Secondary | ICD-10-CM | POA: Insufficient documentation

## 2012-03-16 DIAGNOSIS — Z8679 Personal history of other diseases of the circulatory system: Secondary | ICD-10-CM | POA: Insufficient documentation

## 2012-03-16 DIAGNOSIS — Z8719 Personal history of other diseases of the digestive system: Secondary | ICD-10-CM | POA: Insufficient documentation

## 2012-03-16 DIAGNOSIS — Z79899 Other long term (current) drug therapy: Secondary | ICD-10-CM | POA: Insufficient documentation

## 2012-03-16 DIAGNOSIS — Z90710 Acquired absence of both cervix and uterus: Secondary | ICD-10-CM | POA: Insufficient documentation

## 2012-03-16 LAB — POCT I-STAT, CHEM 8
Chloride: 99 mEq/L (ref 96–112)
Glucose, Bld: 380 mg/dL — ABNORMAL HIGH (ref 70–99)
HCT: 43 % (ref 36.0–46.0)
Hemoglobin: 14.6 g/dL (ref 12.0–15.0)
Potassium: 4 mEq/L (ref 3.5–5.1)
Sodium: 137 mEq/L (ref 135–145)

## 2012-03-16 LAB — CBC WITH DIFFERENTIAL/PLATELET
Basophils Absolute: 0 10*3/uL (ref 0.0–0.1)
Basophils Relative: 0 % (ref 0–1)
Eosinophils Absolute: 0.1 K/uL (ref 0.0–0.7)
Eosinophils Relative: 1 % (ref 0–5)
HCT: 37.1 % (ref 36.0–46.0)
Hemoglobin: 12.6 g/dL (ref 12.0–15.0)
Lymphocytes Relative: 22 % (ref 12–46)
Lymphs Abs: 2.3 K/uL (ref 0.7–4.0)
MCH: 26.1 pg (ref 26.0–34.0)
MCHC: 34 g/dL (ref 30.0–36.0)
MCV: 76.8 fL — ABNORMAL LOW (ref 78.0–100.0)
Monocytes Absolute: 0.3 10*3/uL (ref 0.1–1.0)
Monocytes Relative: 3 % (ref 3–12)
Neutro Abs: 7.6 10*3/uL (ref 1.7–7.7)
Neutrophils Relative %: 74 % (ref 43–77)
Platelets: 293 10*3/uL (ref 150–400)
RBC: 4.83 MIL/uL (ref 3.87–5.11)
RDW: 13 % (ref 11.5–15.5)
WBC: 10.2 10*3/uL (ref 4.0–10.5)

## 2012-03-16 LAB — URINALYSIS, ROUTINE W REFLEX MICROSCOPIC
Bilirubin Urine: NEGATIVE
Glucose, UA: >1000 mg/dL — AB
Ketones, ur: NEGATIVE mg/dL
Leukocytes, UA: NEGATIVE
Nitrite: NEGATIVE
Protein, ur: 30 mg/dL — AB
Specific Gravity, Urine: 1.035 — ABNORMAL HIGH (ref 1.005–1.030)
Urobilinogen, UA: 1 mg/dL (ref 0.0–1.0)
pH: 5.5 (ref 5.0–8.0)

## 2012-03-16 LAB — BASIC METABOLIC PANEL
Calcium: 9 mg/dL (ref 8.4–10.5)
Creatinine, Ser: 0.6 mg/dL (ref 0.50–1.10)
GFR calc Af Amer: >90 mL/min (ref >90)
Sodium: 137 mEq/L (ref 135–145)

## 2012-03-16 LAB — BASIC METABOLIC PANEL WITH GFR
BUN: 14 mg/dL (ref 6–23)
CO2: 30 meq/L (ref 19–32)
Chloride: 100 meq/L (ref 96–112)
GFR calc non Af Amer: >90 mL/min (ref >90)
Glucose, Bld: 291 mg/dL — ABNORMAL HIGH (ref 70–99)
Potassium: 4.1 meq/L (ref 3.5–5.1)

## 2012-03-16 LAB — URINE MICROSCOPIC-ADD ON

## 2012-03-16 LAB — GLUCOSE, CAPILLARY: Glucose-Capillary: 288 mg/dL — ABNORMAL HIGH (ref 70–99)

## 2012-03-16 MED ORDER — ONDANSETRON HCL 4 MG/2ML IJ SOLN
4.0000 mg | Freq: Once | INTRAMUSCULAR | Status: AC
  Administered 2012-03-16: 4 mg via INTRAVENOUS

## 2012-03-16 MED ORDER — PROMETHAZINE HCL 25 MG PO TABS: 25.0000 mg | ORAL_TABLET | Freq: Four times a day (QID) | ORAL | Status: DC | PRN

## 2012-03-16 MED ORDER — HYDROMORPHONE HCL PF 1 MG/ML IJ SOLN
1.0000 mg | Freq: Once | INTRAMUSCULAR | Status: AC
  Administered 2012-03-16: 1 mg via INTRAVENOUS

## 2012-03-16 MED ORDER — SODIUM CHLORIDE 0.9 % IV SOLN
INTRAVENOUS | Status: DC
  Administered 2012-03-16: 14:00:00 via INTRAVENOUS

## 2012-03-16 MED ORDER — ONDANSETRON HCL 4 MG/2ML IJ SOLN
INTRAMUSCULAR | Status: AC
  Filled 2012-03-16: qty 2

## 2012-03-16 MED ORDER — HYDROMORPHONE HCL PF 1 MG/ML IJ SOLN
INTRAMUSCULAR | Status: AC
  Filled 2012-03-16: qty 1

## 2012-03-16 MED ORDER — SODIUM CHLORIDE 0.9 % IV BOLUS (SEPSIS)
1000.0000 mL | Freq: Once | INTRAVENOUS | Status: AC
  Administered 2012-03-16: 1000 mL via INTRAVENOUS

## 2012-03-16 NOTE — ED Notes (Signed)
[facility] CALLED FOR TRANSPORT

## 2012-03-16 NOTE — ED Notes (Signed)
Pt  Reports  Last  Night  She  Developed  abd  Cramps  With  Vomiting      -  No  Diarrhea        -  She  Is  Diabetic  Who  At this  Time  Is  Awake  Alert  And  Oriented

## 2012-03-16 NOTE — ED Notes (Signed)
Pt sent by [facility] from [facility] for n/v since last night. N/v onset last night, denies fevers. Reports abd pain. [facility]CC started PIV and administered 4mg  IVP zofran, pt reports some relief with zofran.

## 2012-03-16 NOTE — ED Provider Notes (Signed)
Chief Complaint  Patient presents with  . Emesis    History of Present Illness:   Susan Horton is a 42 year old diabetic female who has had nausea and emesis of all by mouth intake since 2 AM this morning. She estimates she's vomited about 8 times. There is no blood in the vomitus but she describes black vomitus and also bilious vomitus. She has not had any diarrhea at all. She's had no abdominal pain since 2 AM as well. This is a constant, crampy pain rated 7/10 in intensity. She denies any fever or chills. She's been diabetic since 1966 and is on 40 units of Lantus twice daily as well as metformin. She states her sugars are always very well controlled. She has had an appendectomy and a hysterectomy in 2007. She is allergic to penicillin. She also has asthma.  Review of Systems:  Other than noted above, the patient denies any of the following symptoms: Systemic:  No fevers, chills, sweats, weight loss or gain, fatigue, or tiredness. ENT:  No nasal congestion, rhinorrhea, or sore throat. Lungs:  No cough, wheezing, or shortness of breath. Cardiac:  No chest pain, syncope, or presyncope. GI:  No abdominal pain, nausea, vomiting, anorexia, diarrhea, constipation, blood in stool or vomitus. GU:  No dysuria, frequency, or urgency.  [place]:  Past medical history, family history, social history, meds, and allergies were reviewed.  Physical Exam:   Vital signs:  BP 137/86  Pulse 102  Temp(Src) 99.7 F (37.6 C) (Oral)  Resp 18  SpO2 99% General:  Alert and oriented.  In no distress.  Skin warm and dry.  Good skin turgor, brisk capillary refill. ENT:  No scleral icterus, moist mucous membranes, no oral lesions, pharynx clear. Lungs:  Breath sounds clear and equal bilaterally.  No wheezes, rales, or rhonchi. Heart:  Rhythm regular, without extrasystoles.  No gallops or murmers. Abdomen:  Soft, flat, nondistended. She has moderate tenderness to palpation in the epigastrium without guarding or  rebound. No organomegaly or mass. Bowel sounds are normally active. Skin: Clear, warm, and dry.  Good turgor.  Brisk capillary refill.  Labs:   Results for orders placed during the hospital encounter of 03/16/12  POCT I-STAT, CHEM 8      Result Value Range   Sodium 137  135 - 145 mEq/L   Potassium 4.0  3.5 - 5.1 mEq/L   Chloride 99  96 - 112 mEq/L   BUN 18  6 - 23 mg/dL   Creatinine, Ser 0.80  0.50 - 1.10 mg/dL   Glucose, Bld 380 (*) 70 - 99 mg/dL   Calcium, Ion 1.16  1.12 - 1.23 mmol/L   TCO2 27  0 - 100 mmol/L   Hemoglobin 14.6  12.0 - 15.0 g/dL   HCT 43.0  36.0 - 46.0 %     Course in [facility]:   IV normal saline was started at 150 mL per hour, she was given Zofran 4 mg IV, and will be transferred to the hospital emergency department by [place].  Assessment:  The primary encounter diagnosis was Gastroenteritis. A diagnosis of Poorly controlled diabetes mellitus was also pertinent to this visit.  She will need IV fluids and better control her blood sugar. I think this is probably just a viral gastroenteritis and do not see any signs of intra-abdominal process such as appendicitis or diverticulitis at this time.  Plan:   1.  The following meds were prescribed:   New Prescriptions   No  medications on file   2.  The patient was transferred to the emergency department via [place] in stable condition.  Medical Decision Making:  42 year old female with diabetes, has nausea, ememsis, and abdominal pain since 2 a.m. Today.  Some of her emesis has been black like coffee grounds and some has been bilious.  She denies diarrhea or fever.  Has had an appendectomy.  On exam her abdomen is moderately tender in epigastrium and she has normal bowel sounds.  I-Stat reveals a glucose of 380, otherwise normal.  My impression is gastroenteritis and diabetes poorly controlled.  I think she needs observation in ED for IV fluids, antiemetics, and glucose control.  We have started NS and will  give Zofran 4 mg IV.    Harden Mo, MD 03/16/12 1340

## 2012-03-16 NOTE — Discharge Instructions (Signed)
Nausea and Vomiting Nausea is a sick feeling that often comes before throwing up (vomiting). Vomiting is a reflex where stomach contents come out of your mouth. Vomiting can cause severe loss of body fluids (dehydration). Children and elderly adults can become dehydrated quickly, especially if they also have diarrhea. Nausea and vomiting are symptoms of a condition or disease. It is important to find the cause of your symptoms. CAUSES   Direct irritation of the stomach lining. This irritation can result from increased acid production (gastroesophageal reflux disease), infection, food poisoning, taking certain medicines (such as nonsteroidal anti-inflammatory drugs), alcohol use, or tobacco use.  Signals from the brain.These signals could be caused by a headache, heat exposure, an inner ear disturbance, increased pressure in the brain from injury, infection, a tumor, or a concussion, pain, emotional stimulus, or metabolic problems.  An obstruction in the gastrointestinal tract (bowel obstruction).  Illnesses such as diabetes, hepatitis, gallbladder problems, appendicitis, kidney problems, cancer, sepsis, atypical symptoms of a heart attack, or eating disorders.  Medical treatments such as chemotherapy and radiation.  Receiving medicine that makes you sleep (general anesthetic) during surgery. DIAGNOSIS Your caregiver may ask for tests to be done if the problems do not improve after a few days. Tests may also be done if symptoms are severe or if the reason for the nausea and vomiting is not clear. Tests may include:  Urine tests.  Blood tests.  Stool tests.  Cultures (to look for evidence of infection).  X-rays or other imaging studies. Test results can help your caregiver make decisions about treatment or the need for additional tests. TREATMENT You need to stay well hydrated. Drink frequently but in small amounts.You may wish to drink water, sports drinks, clear broth, or eat frozen  ice pops or gelatin dessert to help stay hydrated.When you eat, eating slowly may help prevent nausea.There are also some antinausea medicines that may help prevent nausea. HOME CARE INSTRUCTIONS   Take all medicine as directed by your caregiver.  If you do not have an appetite, do not force yourself to eat. However, you must continue to drink fluids.  If you have an appetite, eat a normal diet unless your caregiver tells you differently.  Eat a variety of complex carbohydrates (rice, wheat, potatoes, bread), lean meats, yogurt, fruits, and vegetables.  Avoid high-fat foods because they are more difficult to digest.  Drink enough water and fluids to keep your urine clear or pale yellow.  If you are dehydrated, ask your caregiver for specific rehydration instructions. Signs of dehydration may include:  Severe thirst.  Dry lips and mouth.  Dizziness.  Dark urine.  Decreasing urine frequency and amount.  Confusion.  Rapid breathing or pulse. SEEK IMMEDIATE MEDICAL CARE IF:   You have blood or brown flecks (like coffee grounds) in your vomit.  You have black or bloody stools.  You have a severe headache or stiff neck.  You are confused.  You have severe abdominal pain.  You have chest pain or trouble breathing.  You do not urinate at least once every 8 hours.  You develop cold or clammy skin.  You continue to vomit for longer than 24 to 48 hours.  You have a fever. MAKE SURE YOU:   Understand these instructions.  Will watch your condition.  Will get help right away if you are not doing well or get worse. Document Released: 12/28/2004 Document Revised: 03/22/2011 Document Reviewed: 05/27/2010 [employer] Patient Information 2013 [employer].  

## 2012-03-16 NOTE — ED Provider Notes (Signed)
History     CSN: [id number]  Arrival date & time 03/16/12  1426   First MD Initiated Contact with Patient 03/16/12 1609      Chief Complaint  Patient presents with  . Emesis    (Consider location/radiation/quality/duration/timing/severity/associated sxs/prior treatment) HPI Comments: Patient with history of diabetes reports she was woken up early this morning with nausea and immediately began vomiting. She has had about 9 episodes. She is passing flatus but denies any constipation or diarrhea. She does have a prior history of bowel obstruction following immediately her hysterectomy. She's also had a cholecystectomy and an appendectomy in the past. She reports her abdominal discomfort is mostly upper but generalized and feels crampy in nature that is waxing and waning with her vomiting. She denies fever chills, foreign travel or any recent antibiotic use. She thinks possibly at work someone that she is around may have had a stomach virus but she is not sure. She tried to drink liquids today but was unable to. She went to the urgent care and initially received some IV medications with some improvement by the time she is arrived here but still feels fatigued and abdominal pain is improved but not resolved. She reports no back pain, dysuria, hematuria, GYN symptoms.  Patient is a 42 y.o. female presenting with vomiting. The history is provided by the patient.  Emesis Associated symptoms: abdominal pain   Associated symptoms: no diarrhea     Past Medical History  Diagnosis Date  . Hidradenitis   . Asthma     prn inhaler  . GERD (gastroesophageal reflux disease)   . Diabetes mellitus     IDDM  . Headache     migraines    Past Surgical History  Procedure Laterality Date  . Tonsillectomy    . Inguinal hidradenitis excision  07/06/2010    bilateral  . Exploratory laparotomy  08/14/2005    lysis of adhesions, drainage of tubo-ovarian abscess  . Breast reduction surgery  2002  .  Appendectomy  1995  . Cholecystectomy  1993  . Tubal ligation  1996  . Vaginal hysterectomy  08/04/2005    and cysto  . Hydradenitis excision  02/08/2011    Procedure: EXCISION HYDRADENITIS GROIN;  Surgeon: [name], MD;  Location: [facility];  Service: General;  Laterality: N/A;  Excisioin of Hidradenitis Left groin    Family History  Problem Relation Age of Onset  . Hypertension Father   . Hypertension Sister   . Hypertension Brother     History  Substance Use Topics  . Smoking status: Current Every Day Smoker -- 0.25 packs/day for 10 years    Types: Cigarettes  . Smokeless tobacco: Never Used     Comment: 1 pack/week  . Alcohol Use: No    OB History   Grav Para Term Preterm Abortions TAB SAB Ect Mult Living                  Review of Systems  Constitutional: Positive for fatigue.  Respiratory: Negative for cough and shortness of breath.   Cardiovascular: Negative for chest pain.  Gastrointestinal: Positive for nausea, vomiting and abdominal pain. Negative for diarrhea and constipation.  Genitourinary: Negative for dysuria, difficulty urinating and pelvic pain.  Musculoskeletal: Negative for back pain.  Neurological: Positive for weakness. Negative for dizziness and syncope.  All other systems reviewed and are negative.    Allergies  Penicillins  Home Medications   Current Outpatient Rx  Name  Route  Sig  Dispense  Refill  . albuterol (PROVENTIL HFA;VENTOLIN HFA) 108 (90 BASE) MCG/ACT inhaler   Inhalation   Inhale 1-2 puffs into the lungs every 6 (six) hours as needed for wheezing.   1 Inhaler   0   . insulin glargine (LANTUS) 100 UNIT/ML injection   Subcutaneous   Inject 40 Units into the skin 2 (two) times daily.          . metFORMIN (GLUCOPHAGE) 500 MG tablet   Oral   Take 500 mg by mouth 2 (two) times daily with a meal.         . promethazine (PHENERGAN) 25 MG tablet   Oral   Take 1 tablet (25 mg total) by mouth every  6 (six) hours as needed for nausea.   20 tablet   0     BP 135/75  Pulse 86  Temp(Src) 98.4 F (36.9 C) (Oral)  Resp 16  SpO2 100%  Physical Exam  Nursing note and vitals reviewed. Constitutional: She is oriented to person, place, and time. She appears well-developed and well-nourished.  HENT:  Head: Normocephalic and atraumatic.  Eyes: EOM are normal. No scleral icterus.  Neck: Normal range of motion. Neck supple.  Cardiovascular: Normal rate, regular rhythm and intact distal pulses.   No murmur heard. Pulmonary/Chest: Effort normal. No respiratory distress. She has no wheezes. She has no rales.  Abdominal: Soft. She exhibits no distension and no fluid wave. Bowel sounds are decreased. There is no hepatosplenomegaly. There is generalized tenderness. There is no rebound, no guarding and no CVA tenderness.  Musculoskeletal: She exhibits no edema.  Neurological: She is alert and oriented to person, place, and time. No cranial nerve deficit. Coordination normal.  Skin: Skin is dry. No rash noted.    ED Course  Procedures (including critical care time)  Labs Reviewed  GLUCOSE, CAPILLARY - Abnormal; Notable for the following:    Glucose-Capillary 288 (*)    All other components within normal limits  URINALYSIS, ROUTINE W REFLEX MICROSCOPIC - Abnormal; Notable for the following:    APPearance HAZY (*)    Specific Gravity, Urine 1.035 (*)    Glucose, UA >1000 (*)    Hgb urine dipstick SMALL (*)    Protein, ur 30 (*)    All other components within normal limits  CBC WITH DIFFERENTIAL - Abnormal; Notable for the following:    MCV 76.8 (*)    All other components within normal limits  BASIC METABOLIC PANEL - Abnormal; Notable for the following:    Glucose, Bld 291 (*)    All other components within normal limits  URINE MICROSCOPIC-ADD ON - Abnormal; Notable for the following:    Squamous Epithelial / LPF MANY (*)    All other components within normal limits   Dg Abd Acute  W/chest  03/16/2012  *RADIOLOGY REPORT*  Clinical Data: Vomiting.  Body aches.  ACUTE ABDOMEN SERIES (ABDOMEN 2 VIEW & CHEST 1 VIEW)  Comparison: Chest and two views abdomen 08/17/2005 and CT abdomen and pelvis 08/13/2005.  Findings: Single view of the chest demonstrates clear lungs and normal heart size.  No pneumothorax or pleural fluid.  Two views of the abdomen show no free intraperitoneal air.  The bowel gas pattern is normal.  Cholecystectomy clips are noted.  IMPRESSION: No acute finding chest or abdomen.   Original Report Authenticated By: Orlean Patten, M.D.   I reviewed the above films myself.     1. Nausea and vomiting   2.  Diabetes     Room air saturation is 100% I interpret this to be normal.  5:29 PM Plain films 3 views shows no free air and no signs of obvious small bowel obstruction. Clinically I have low suspicion.   7:03 PM Patient feels much improved after IV fluids. She has had no further episodes of vomiting. She has eaten and drank here with no recurrent abdominal pain or vomiting. She is no abnormal anion gap and no ketones on her urinalysis. MDM   Patient with persistent vomiting but no diarrhea. She is afebrile with stable vital signs. Abdomen is soft minimally distended with decreased bowel sounds. Given she had a prior history of bowel obstruction, despite the fact that she reports that she is having flatus, will obtain plain films of her abdomen reviewed. Otherwise we'll give antiemetics, IV fluids and check electrolytes to ensure she does not have an abnormal anion gap. Otherwise if her symptoms didn't improve in the [place] department I suspect she would be safe for discharge to home and followup with her primary care physician.        Saddie Benders. Dorna Mai, MD 03/16/12 [id number]

## 2012-03-31 ENCOUNTER — Emergency Department (HOSPITAL_COMMUNITY)
Admission: EM | Admit: 2012-03-31 | Discharge: 2012-04-01 | Disposition: A | Discharge status: Home / Self Care | Payer: BC Managed Care – PPO | Attending: Emergency Medicine | Admitting: Emergency Medicine

## 2012-03-31 ENCOUNTER — Encounter (HOSPITAL_COMMUNITY): Payer: Self-pay | Admitting: *Deleted

## 2012-03-31 DIAGNOSIS — J45909 Unspecified asthma, uncomplicated: Secondary | ICD-10-CM | POA: Insufficient documentation

## 2012-03-31 DIAGNOSIS — Z8719 Personal history of other diseases of the digestive system: Secondary | ICD-10-CM | POA: Insufficient documentation

## 2012-03-31 DIAGNOSIS — E119 Type 2 diabetes mellitus without complications: Secondary | ICD-10-CM

## 2012-03-31 DIAGNOSIS — G43909 Migraine, unspecified, not intractable, without status migrainosus: Secondary | ICD-10-CM | POA: Insufficient documentation

## 2012-03-31 DIAGNOSIS — Z79899 Other long term (current) drug therapy: Secondary | ICD-10-CM | POA: Insufficient documentation

## 2012-03-31 DIAGNOSIS — F17200 Nicotine dependence, unspecified, uncomplicated: Secondary | ICD-10-CM | POA: Insufficient documentation

## 2012-03-31 DIAGNOSIS — R739 Hyperglycemia, unspecified: Secondary | ICD-10-CM

## 2012-03-31 DIAGNOSIS — R112 Nausea with vomiting, unspecified: Secondary | ICD-10-CM | POA: Insufficient documentation

## 2012-03-31 DIAGNOSIS — E1169 Type 2 diabetes mellitus with other specified complication: Secondary | ICD-10-CM | POA: Insufficient documentation

## 2012-03-31 DIAGNOSIS — Z794 Long term (current) use of insulin: Secondary | ICD-10-CM | POA: Insufficient documentation

## 2012-03-31 DIAGNOSIS — Z8669 Personal history of other diseases of the nervous system and sense organs: Secondary | ICD-10-CM

## 2012-03-31 DIAGNOSIS — R51 Headache: Secondary | ICD-10-CM

## 2012-03-31 DIAGNOSIS — Z872 Personal history of diseases of the skin and subcutaneous tissue: Secondary | ICD-10-CM | POA: Insufficient documentation

## 2012-03-31 NOTE — ED Notes (Signed)
CBG checked 414

## 2012-03-31 NOTE — ED Notes (Signed)
Pt c/o HA since 8 am this morning.  States HA had sudden start with photophobia.  Pt also c/o n/v.

## 2012-04-01 ENCOUNTER — Emergency Department (HOSPITAL_COMMUNITY): Payer: BC Managed Care – PPO

## 2012-04-01 LAB — CSF CELL COUNT WITH DIFFERENTIAL
Eosinophils, CSF: 0 % (ref 0–1)
Monocyte-Macrophage-Spinal Fluid: 0 % — ABNORMAL LOW (ref 15–45)
RBC Count, CSF: 1750 /mm3 — ABNORMAL HIGH
Tube #: 4
WBC, CSF: 1 /mm3 (ref 0–5)

## 2012-04-01 LAB — POCT I-STAT, CHEM 8
BUN: 21 mg/dL (ref 6–23)
Calcium, Ion: 1.21 mmol/L (ref 1.12–1.23)
Chloride: 103 mEq/L (ref 96–112)
Creatinine, Ser: 0.7 mg/dL (ref 0.50–1.10)
Glucose, Bld: 415 mg/dL — ABNORMAL HIGH (ref 70–99)
Potassium: 5.1 mEq/L (ref 3.5–5.1)

## 2012-04-01 LAB — CBC WITH DIFFERENTIAL/PLATELET
Basophils Absolute: 0 10*3/uL (ref 0.0–0.1)
Eosinophils Relative: 0 % (ref 0–5)
HCT: 36.2 % (ref 36.0–46.0)
Hemoglobin: 12.3 g/dL (ref 12.0–15.0)
Lymphocytes Relative: 12 % (ref 12–46)
MCHC: 34 g/dL (ref 30.0–36.0)
MCV: 75.4 fL — ABNORMAL LOW (ref 78.0–100.0)
Monocytes Absolute: 0.1 10*3/uL (ref 0.1–1.0)
Monocytes Relative: 1 % — ABNORMAL LOW (ref 3–12)
RDW: 12.9 % (ref 11.5–15.5)
WBC: 10.5 10*3/uL (ref 4.0–10.5)

## 2012-04-01 LAB — GLUCOSE, CAPILLARY
Glucose-Capillary: 428 mg/dL — ABNORMAL HIGH (ref 70–99)
Glucose-Capillary: 351 mg/dL — ABNORMAL HIGH (ref 70–99)

## 2012-04-01 LAB — URINALYSIS, ROUTINE W REFLEX MICROSCOPIC
Bilirubin Urine: NEGATIVE
Glucose, UA: >1000 mg/dL — AB
Ketones, ur: NEGATIVE mg/dL
Nitrite: NEGATIVE
Specific Gravity, Urine: 1.027 (ref 1.005–1.030)
pH: 5.5 (ref 5.0–8.0)

## 2012-04-01 LAB — PROTEIN AND GLUCOSE, CSF: Glucose, CSF: 198 mg/dL — ABNORMAL HIGH (ref 43–76)

## 2012-04-01 MED ORDER — INSULIN GLARGINE 100 UNIT/ML ~~LOC~~ SOLN: 40.0000 [IU] | Freq: Two times a day (BID) | SUBCUTANEOUS | Status: DC

## 2012-04-01 MED ORDER — INSULIN ASPART 100 UNIT/ML ~~LOC~~ SOLN
10.0000 [IU] | Freq: Once | SUBCUTANEOUS | Status: AC
  Administered 2012-04-01: 10 [IU] via SUBCUTANEOUS
  Filled 2012-04-01: qty 1

## 2012-04-01 MED ORDER — MAGNESIUM SULFATE 40 MG/ML IJ SOLN
2.0000 g | Freq: Once | INTRAMUSCULAR | Status: AC
  Administered 2012-04-01: 2 g via INTRAVENOUS
  Filled 2012-04-01: qty 50

## 2012-04-01 MED ORDER — SODIUM CHLORIDE 0.9 % IV BOLUS (SEPSIS)
1000.0000 mL | Freq: Once | INTRAVENOUS | Status: AC
  Administered 2012-04-01: 1000 mL via INTRAVENOUS

## 2012-04-01 MED ORDER — INSULIN ASPART 100 UNIT/ML IV SOLN
5.0000 [IU] | Freq: Once | INTRAVENOUS | Status: AC
  Administered 2012-04-01: 5 [IU] via INTRAVENOUS

## 2012-04-01 MED ORDER — METFORMIN HCL 500 MG PO TABS
500.0000 mg | ORAL_TABLET | Freq: Once | ORAL | Status: AC
  Administered 2012-04-01: 500 mg via ORAL
  Filled 2012-04-01: qty 1

## 2012-04-01 MED ORDER — FREESTYLE LANCETS MISC: Status: DC

## 2012-04-01 MED ORDER — SODIUM CHLORIDE 0.9 % IV SOLN
INTRAVENOUS | Status: DC
  Administered 2012-04-01 (×2): via INTRAVENOUS

## 2012-04-01 MED ORDER — DIPHENHYDRAMINE HCL 50 MG/ML IJ SOLN
25.0000 mg | Freq: Once | INTRAMUSCULAR | Status: AC
  Administered 2012-04-01: 25 mg via INTRAVENOUS
  Filled 2012-04-01: qty 1

## 2012-04-01 MED ORDER — METOCLOPRAMIDE HCL 5 MG/ML IJ SOLN
10.0000 mg | Freq: Once | INTRAMUSCULAR | Status: AC
  Administered 2012-04-01: 10 mg via INTRAVENOUS
  Filled 2012-04-01: qty 2

## 2012-04-01 MED ORDER — IBUPROFEN 800 MG PO TABS: 800.0000 mg | ORAL_TABLET | Freq: Three times a day (TID) | ORAL | Status: DC

## 2012-04-01 MED ORDER — DEXAMETHASONE SODIUM PHOSPHATE 4 MG/ML IJ SOLN
10.0000 mg | Freq: Once | INTRAMUSCULAR | Status: AC
  Administered 2012-04-01: 10 mg via INTRAVENOUS
  Filled 2012-04-01: qty 3

## 2012-04-01 MED ORDER — INSULIN ASPART 100 UNIT/ML ~~LOC~~ SOLN
8.0000 [IU] | Freq: Once | SUBCUTANEOUS | Status: AC
  Administered 2012-04-01: 8 [IU] via SUBCUTANEOUS
  Filled 2012-04-01: qty 1

## 2012-04-01 NOTE — ED Provider Notes (Signed)
History     CSN: [id number]  Arrival date & time 03/31/12  2322   First MD Initiated Contact with Patient 04/01/12 0123      Chief Complaint  Patient presents with  . Headache    (Consider location/radiation/quality/duration/timing/severity/associated sxs/prior treatment) HPI  Past Medical History  Diagnosis Date  . Hidradenitis   . Asthma     prn inhaler  . GERD (gastroesophageal reflux disease)   . Diabetes mellitus     IDDM  . Headache     migraines    Past Surgical History  Procedure Laterality Date  . Tonsillectomy    . Inguinal hidradenitis excision  07/06/2010    bilateral  . Exploratory laparotomy  08/14/2005    lysis of adhesions, drainage of tubo-ovarian abscess  . Breast reduction surgery  2002  . Appendectomy  1995  . Cholecystectomy  1993  . Tubal ligation  1996  . Vaginal hysterectomy  08/04/2005    and cysto  . Hydradenitis excision  02/08/2011    Procedure: EXCISION HYDRADENITIS GROIN;  Surgeon: [name], MD;  Location: [facility];  Service: General;  Laterality: N/A;  Excisioin of Hidradenitis Left groin    Family History  Problem Relation Age of Onset  . Hypertension Father   . Hypertension Sister   . Hypertension Brother     History  Substance Use Topics  . Smoking status: Current Every Day Smoker -- 0.25 packs/day for 10 years    Types: Cigarettes  . Smokeless tobacco: Never Used     Comment: 1 pack/week  . Alcohol Use: No    OB History   Grav Para Term Preterm Abortions TAB SAB Ect Mult Living                  Review of Systems  Allergies  Penicillins  Home Medications   Current Outpatient Rx  Name  Route  Sig  Dispense  Refill  . albuterol (PROVENTIL HFA;VENTOLIN HFA) 108 (90 BASE) MCG/ACT inhaler   Inhalation   Inhale 1-2 puffs into the lungs every 6 (six) hours as needed for wheezing.   1 Inhaler   0   . insulin glargine (LANTUS) 100 UNIT/ML injection   Subcutaneous   Inject 40 Units into  the skin 2 (two) times daily.          . metFORMIN (GLUCOPHAGE) 500 MG tablet   Oral   Take 500 mg by mouth 2 (two) times daily with a meal.         . promethazine (PHENERGAN) 25 MG tablet   Oral   Take 1 tablet (25 mg total) by mouth every 6 (six) hours as needed for nausea.   20 tablet   0     BP 135/84  Pulse 83  Temp(Src) 98 F (36.7 C)  Resp 16  SpO2 98%  Physical Exam  ED Course  LUMBAR PUNCTURE Date/Time: 04/01/2012 3:54 AM Performed by: Teressa Lower Authorized by: Teressa Lower Consent: Verbal consent obtained. Risks and benefits: risks, benefits and alternatives were discussed Consent given by: patient Patient understanding: patient states understanding of the procedure being performed Patient consent: the patient's understanding of the procedure matches consent given Procedure consent: procedure consent matches procedure scheduled Required items: required blood products, implants, devices, and special equipment available Patient identity confirmed: verbally with patient Time out: Immediately prior to procedure a "time out" was called to verify the correct patient, procedure, equipment, support staff and site/side marked as required. Indications:  evaluation for subarachnoid hemorrhage Anesthesia: local infiltration Local anesthetic: lidocaine 1% without epinephrine Anesthetic total: 2 ml Patient sedated: no Preparation: Patient was prepped and draped in the usual sterile fashion. Lumbar space: L4-L5 interspace Patient's position: sitting Needle gauge: 20 Needle type: spinal needle - Quincke tip Needle length: 2.5 in Number of attempts: 1 Fluid appearance: clear Tubes of fluid: 4 Total volume: 4 ml Post-procedure: site cleaned and adhesive bandage applied Patient tolerance: Patient tolerated the procedure well with no immediate complications.   (including critical care time)    MDM          Teressa Lower, MD 04/01/12 [phone]

## 2012-04-01 NOTE — ED Provider Notes (Signed)
Assumed care of patient from [facility], [place].  Patient presents today with a chief complaint of headache.  CT head and LP negative.  Patient with a history of DM found to be hyperglycemic.  She has been given 1 Liter IVF and also 8 Units Novolog Subcutaneous and also 5 Units of Novolog IV.  Her blood sugar remains elevated.  Plan is for the patient to receive another 1 Liter IVF and discharge home once the blood sugar comes down to 300.  Patient will be given prescription for Insulin at discharge since she ran out of it two weeks ago.  CBC, BMP, and UA ordered.   Patient with an Anion Gap of 10.  Normal Bicarbonate.  Therefore, doubt DKA.  12:44 AM CBG 354 after patient has been eating diabetic diet.  UA does not show any ketones or signs of infection.  Therefore, feel that the patient can be discharged.  Patient discharged home with prescription for her Lantus.  Sherlyn Lees [place], PA-C 04/01/12 2118

## 2012-04-01 NOTE — ED Notes (Signed)
Dr Marnette Burgess at bedside for LP

## 2012-04-01 NOTE — ED Provider Notes (Signed)
History     CSN: [id number]  Arrival date & time 03/31/12  2322   First MD Initiated Contact with Patient 04/01/12 0123      Chief Complaint  Patient presents with  . Headache    (Consider location/radiation/quality/duration/timing/severity/associated sxs/prior treatment) HPI Comments: Patient is a 42 y/o F with PMHx of DM, migraines, c/o headache that began yesterday morning (03/31/2012). Patient reported that the headache started at 8:00am, was a sudden onset and patient explained that it was severe. Patient described pain as constant and throbbing, with no radiation. Patient reported that she never had a headache like this before - reported that pain was "three times worse' then normal migraines she receives. Patient reported 2 episodes of vomiting. Patient reported taking Tylenol with little relief. Associated symptoms are nausea. Denied fever, chills, neck pain, back pain, abdominal pain, photophobia, phonophobia, diarrhea, congestion.    The history is provided by the patient.    Past Medical History  Diagnosis Date  . Hidradenitis   . Asthma     prn inhaler  . GERD (gastroesophageal reflux disease)   . Diabetes mellitus     IDDM  . Headache     migraines    Past Surgical History  Procedure Laterality Date  . Tonsillectomy    . Inguinal hidradenitis excision  07/06/2010    bilateral  . Exploratory laparotomy  08/14/2005    lysis of adhesions, drainage of tubo-ovarian abscess  . Breast reduction surgery  2002  . Appendectomy  1995  . Cholecystectomy  1993  . Tubal ligation  1996  . Vaginal hysterectomy  08/04/2005    and cysto  . Hydradenitis excision  02/08/2011    Procedure: EXCISION HYDRADENITIS GROIN;  Surgeon: [name], MD;  Location: [facility];  Service: General;  Laterality: N/A;  Excisioin of Hidradenitis Left groin    Family History  Problem Relation Age of Onset  . Hypertension Father   . Hypertension Sister   . Hypertension  Brother     History  Substance Use Topics  . Smoking status: Current Every Day Smoker -- 0.25 packs/day for 10 years    Types: Cigarettes  . Smokeless tobacco: Never Used     Comment: 1 pack/week  . Alcohol Use: No    OB History   Grav Para Term Preterm Abortions TAB SAB Ect Mult Living                  Review of Systems  Constitutional: Negative for fever.  HENT: Negative for neck pain.   Eyes: Negative for pain.  Respiratory: Negative for chest tightness and shortness of breath.   Cardiovascular: Negative for chest pain.  Gastrointestinal: Positive for nausea and vomiting. Negative for abdominal pain.  Neurological: Positive for headaches.  10 Systems reviewed and are negative for acute change except as noted in the HPI.   Allergies  Penicillins  Home Medications   Current Outpatient Rx  Name  Route  Sig  Dispense  Refill  . albuterol (PROVENTIL HFA;VENTOLIN HFA) 108 (90 BASE) MCG/ACT inhaler   Inhalation   Inhale 1-2 puffs into the lungs every 6 (six) hours as needed for wheezing.   1 Inhaler   0   . insulin glargine (LANTUS) 100 UNIT/ML injection   Subcutaneous   Inject 40 Units into the skin 2 (two) times daily.          . metFORMIN (GLUCOPHAGE) 500 MG tablet   Oral   Take 500 mg  by mouth 2 (two) times daily with a meal.         . promethazine (PHENERGAN) 25 MG tablet   Oral   Take 1 tablet (25 mg total) by mouth every 6 (six) hours as needed for nausea.   20 tablet   0   . ibuprofen (ADVIL,MOTRIN) 800 MG tablet   Oral   Take 1 tablet (800 mg total) by mouth 3 (three) times daily.   21 tablet   0   . insulin glargine (LANTUS) 100 UNIT/ML injection   Subcutaneous   Inject 0.4 mLs (40 Units total) into the skin 2 (two) times daily.   12 mL   0     BP 126/78  Pulse 100  Temp(Src) 98.4 F (36.9 C) (Oral)  Resp 18  SpO2 99%  Physical Exam  Nursing note and vitals reviewed. Constitutional: She appears well-developed and well-nourished.   HENT:  Head: Normocephalic.  Nose: Nose normal.  Mouth/Throat: Oropharynx is clear and moist. No oropharyngeal exudate.  Eyes: Conjunctivae and EOM are normal. Pupils are equal, round, and reactive to light. Right eye exhibits no discharge. Left eye exhibits no discharge.  Neck: Normal range of motion. Neck supple.  Cardiovascular: Normal rate, regular rhythm, normal heart sounds and intact distal pulses.  Exam reveals no friction rub.   No murmur heard. Pulmonary/Chest: Effort normal and breath sounds normal. She has no wheezes. She has no rales.  Abdominal: Soft. Bowel sounds are normal. She exhibits no distension. There is no tenderness. There is no rebound.  Lymphadenopathy:    She has no cervical adenopathy.  Skin: Skin is warm and dry. No rash noted. She is not diaphoretic. No erythema.    ED Course  Procedures (including critical care time)  Labs Reviewed  GLUCOSE, CAPILLARY - Abnormal; Notable for the following:    Glucose-Capillary 414 (*)    All other components within normal limits  GLUCOSE, CAPILLARY - Abnormal; Notable for the following:    Glucose-Capillary 414 (*)    All other components within normal limits  CSF CELL COUNT WITH DIFFERENTIAL - Abnormal; Notable for the following:    Appearance, CSF HAZY (*)    RBC Count, CSF 1750 (*)    All other components within normal limits  PROTEIN AND GLUCOSE, CSF - Abnormal; Notable for the following:    Glucose, CSF 198 (*)    All other components within normal limits  CSF CELL COUNT WITH DIFFERENTIAL - Abnormal; Notable for the following:    RBC Count, CSF 5 (*)    Monocyte-Macrophage-Spinal Fluid 0 (*)    All other components within normal limits  GLUCOSE, CAPILLARY - Abnormal; Notable for the following:    Glucose-Capillary 445 (*)    All other components within normal limits  GLUCOSE, CAPILLARY - Abnormal; Notable for the following:    Glucose-Capillary 428 (*)    All other components within normal limits  CSF  CULTURE   Ct Head Wo Contrast  04/01/2012  *RADIOLOGY REPORT*  Clinical Data: Severe headache  CT HEAD WITHOUT CONTRAST  Technique:  Contiguous axial images were obtained from the base of the skull through the vertex without contrast.  Comparison: 09/08/2009  Findings: There is no evidence for acute hemorrhage, hydrocephalus, mass lesion, or abnormal extra-axial fluid collection.  No definite CT evidence for acute infarction.  The The visualized paranasal sinuses and mastoid air cells are predominately clear.  IMPRESSION: No acute intracranial abnormality.   Original Report Authenticated By: Carlos Levering,  M.D.    Danley Danker Vitals:   04/01/12 0530  BP: 126/78  Pulse: 100  Temp:   Resp:      1. Headache   2. History of migraine   3. Diabetes mellitus, type 2       MDM  Patient is a 42 y/o F presenting with sudden onset of severe headache that occurred yesterday morning. Patient reported that pain is a constant, throbbing without radiation. Reported 2 episodes of vomiting. Described as worst headache. Denied fever, chills, photophobia, phonophobia.   I personally evaluated and examined the patient.  PE: Tired appearance, was not able to keep eyes open, lights were off. Head: no pain upon palpation. Eyes: PERRLA, conjunctiva normal. EOMs intact. Cardiac: Regular rate and rhythm, S1/S2 normal. Lungs: CTA b/l.   Discussed case with Dr. Montez Morita - recommended CT scan of head and LP to r/o SAH.   CT scan of head: no evidence of hydrocephalus, mass lesion, or abnormal extra-atrial fluid collection - negative increased intracranial pressure.  7:09 AM LP performed by Dr. Montez Morita.  LP- negative findings for [facility]  Patient's sugar has been high, CBG 414 when patient arrived. Gave Novolog injection 8 Units, NS bolus. Repeated with readings of 414 and 445, placed patient on continuous fluids. Gave Novolog 5 Units IV. 7:09 AM CBG 428, gave NS 0.9% bolus, 1000 mL and metformin 500 mg. Continue to monitor  CBG, goal is 300.    Patient is afebrile, normotensive, non-tachycardic, alert. Patient is aseptic,no sign of SAH. Discussed with Teofilo Pod, PA-C plan for patient and to monitor glucose levels - transfer of care 7:25 AM to [facility] ([place]).    Once CBG is 300 or less patient is to be discharged. Headache possible exacerbation of migraines and uncontrolled glucose levels. Discharged patient with Ibuprofen. Discharged patient with Insulin due to not being able to see physician in 2 weeks and patient is running low. Discussed with patient to continue taking home medications as prescribed and to monitor glucose levels at home. Discussed with patient to follow-up with physician as outpatient. Discussed with patient if symptoms are to worsen to report back to the ED.            Jamse Mead, PA-C 04/01/12 [phone]

## 2012-04-02 NOTE — ED Provider Notes (Signed)
Medical screening examination/treatment/procedure(s) were conducted as a shared visit with non-physician practitioner(s) and myself.  I personally evaluated the patient during the encounter  Teressa Lower, MD 04/02/12 [phone]

## 2012-04-02 NOTE — ED Provider Notes (Signed)
Medical screening examination/treatment/procedure(s) were conducted as a shared visit with non-physician practitioner(s) and myself.  I personally evaluated the patient during the encounter. HA sudden onset severe with normal neuro exam: speech clear, cooperative, appropriate, CN 2- 12 intact, equal strengths, DTRs and sensorium to light touch, gait intact. HA treated, CT and LP for r/o SAH performed.   Teressa Lower, MD 04/02/12 [phone]

## 2012-04-03 ENCOUNTER — Emergency Department (HOSPITAL_COMMUNITY): Payer: BC Managed Care – PPO

## 2012-04-03 ENCOUNTER — Emergency Department (HOSPITAL_COMMUNITY)
Admission: EM | Admit: 2012-04-03 | Discharge: 2012-04-04 | Disposition: A | Discharge status: Home / Self Care | Payer: BC Managed Care – PPO | Attending: Emergency Medicine | Admitting: Emergency Medicine

## 2012-04-03 ENCOUNTER — Encounter (HOSPITAL_COMMUNITY): Payer: Self-pay | Admitting: Emergency Medicine

## 2012-04-03 DIAGNOSIS — K219 Gastro-esophageal reflux disease without esophagitis: Secondary | ICD-10-CM | POA: Insufficient documentation

## 2012-04-03 DIAGNOSIS — H9313 Tinnitus, bilateral: Secondary | ICD-10-CM

## 2012-04-03 DIAGNOSIS — H9319 Tinnitus, unspecified ear: Secondary | ICD-10-CM | POA: Insufficient documentation

## 2012-04-03 DIAGNOSIS — F17200 Nicotine dependence, unspecified, uncomplicated: Secondary | ICD-10-CM | POA: Insufficient documentation

## 2012-04-03 DIAGNOSIS — IMO0001 Reserved for inherently not codable concepts without codable children: Secondary | ICD-10-CM | POA: Insufficient documentation

## 2012-04-03 DIAGNOSIS — Z79899 Other long term (current) drug therapy: Secondary | ICD-10-CM | POA: Insufficient documentation

## 2012-04-03 DIAGNOSIS — R51 Headache: Secondary | ICD-10-CM

## 2012-04-03 DIAGNOSIS — Z872 Personal history of diseases of the skin and subcutaneous tissue: Secondary | ICD-10-CM | POA: Insufficient documentation

## 2012-04-03 DIAGNOSIS — Z794 Long term (current) use of insulin: Secondary | ICD-10-CM | POA: Insufficient documentation

## 2012-04-03 DIAGNOSIS — Z8679 Personal history of other diseases of the circulatory system: Secondary | ICD-10-CM | POA: Insufficient documentation

## 2012-04-03 DIAGNOSIS — J45909 Unspecified asthma, uncomplicated: Secondary | ICD-10-CM | POA: Insufficient documentation

## 2012-04-03 DIAGNOSIS — R739 Hyperglycemia, unspecified: Secondary | ICD-10-CM

## 2012-04-03 LAB — CBC WITH DIFFERENTIAL/PLATELET
Basophils Absolute: 0 10*3/uL (ref 0.0–0.1)
Eosinophils Relative: 1 % (ref 0–5)
HCT: 38.6 % (ref 36.0–46.0)
Lymphocytes Relative: 37 % (ref 12–46)
Lymphs Abs: 3.1 10*3/uL (ref 0.7–4.0)
MCV: 78.5 fL (ref 78.0–100.0)
Neutro Abs: 4.8 10*3/uL (ref 1.7–7.7)
Platelets: 356 10*3/uL (ref 150–400)
RBC: 4.92 MIL/uL (ref 3.87–5.11)
WBC: 8.4 10*3/uL (ref 4.0–10.5)

## 2012-04-03 LAB — COMPREHENSIVE METABOLIC PANEL
ALT: 13 U/L (ref 0–35)
AST: 11 U/L (ref 0–37)
Alkaline Phosphatase: 115 U/L (ref 39–117)
CO2: 29 mEq/L (ref 19–32)
Calcium: 9.3 mg/dL (ref 8.4–10.5)
Chloride: 97 mEq/L (ref 96–112)
GFR calc Af Amer: >90 mL/min (ref >90)
GFR calc non Af Amer: >90 mL/min (ref >90)
Glucose, Bld: 455 mg/dL — ABNORMAL HIGH (ref 70–99)
Sodium: 135 mEq/L (ref 135–145)
Total Bilirubin: 0.3 mg/dL (ref 0.3–1.2)

## 2012-04-03 LAB — GLUCOSE, CAPILLARY: Glucose-Capillary: 236 mg/dL — ABNORMAL HIGH (ref 70–99)

## 2012-04-03 MED ORDER — PROCHLORPERAZINE EDISYLATE 5 MG/ML IJ SOLN
10.0000 mg | Freq: Once | INTRAMUSCULAR | Status: AC
  Administered 2012-04-03: 10 mg via INTRAVENOUS
  Filled 2012-04-03: qty 2

## 2012-04-03 MED ORDER — SODIUM CHLORIDE 0.9 % IV BOLUS (SEPSIS)
1000.0000 mL | Freq: Once | INTRAVENOUS | Status: AC
  Administered 2012-04-03: 1000 mL via INTRAVENOUS

## 2012-04-03 MED ORDER — INSULIN ASPART 100 UNIT/ML ~~LOC~~ SOLN
10.0000 [IU] | Freq: Once | SUBCUTANEOUS | Status: AC
  Administered 2012-04-03: 10 [IU] via SUBCUTANEOUS
  Filled 2012-04-03: qty 1

## 2012-04-03 MED ORDER — DIPHENHYDRAMINE HCL 50 MG/ML IJ SOLN
12.5000 mg | Freq: Once | INTRAMUSCULAR | Status: AC
  Administered 2012-04-03: 12.5 mg via INTRAVENOUS
  Filled 2012-04-03: qty 1

## 2012-04-03 NOTE — ED Notes (Signed)
CBG 236. 

## 2012-04-03 NOTE — ED Notes (Signed)
States has had h/a ringing in ears since Friday ,was seen here for same on 3/21 but is no better

## 2012-04-03 NOTE — ED Notes (Signed)
CBG-238  

## 2012-04-03 NOTE — ED Notes (Signed)
Pt transported to MR 

## 2012-04-03 NOTE — ED Notes (Signed)
Pt returned from MR, pt placed on monitor

## 2012-04-03 NOTE — ED Notes (Signed)
Pt ambulated to restroom. 

## 2012-04-03 NOTE — ED Notes (Signed)
Pt's CBG is 516 mg/dl.RN Luellen Pucker notified.

## 2012-04-03 NOTE — ED Provider Notes (Signed)
History     CSN: [id number]  Arrival date & time 04/03/12  1323   First MD Initiated Contact with Patient 04/03/12 1709     Chief Complaint  Patient presents with  . Tinnitus   HPI  42 y/o female with history as noted below who presents with cc of headache. The headache has been present for 5 days. The headache was sudden in onset. The patient was seen here and evaluated on 05/31/12 and had a normal head CT and normal LP. She was discharged home without relief of her headache. Her headache has been persistent since discharge. She states that both ears have a sensation of "pulsating" and "ringing". The headache is frontal in nature. She states the pain is a 12/10. She denies any fevers, chills, vomiting, neck stiffness, or neurological deficits.   Past Medical History  Diagnosis Date  . Hidradenitis   . Asthma     prn inhaler  . GERD (gastroesophageal reflux disease)   . Diabetes mellitus     IDDM  . Headache     migraines    Past Surgical History  Procedure Laterality Date  . Tonsillectomy    . Inguinal hidradenitis excision  07/06/2010    bilateral  . Exploratory laparotomy  08/14/2005    lysis of adhesions, drainage of tubo-ovarian abscess  . Breast reduction surgery  2002  . Appendectomy  1995  . Cholecystectomy  1993  . Tubal ligation  1996  . Vaginal hysterectomy  08/04/2005    and cysto  . Hydradenitis excision  02/08/2011    Procedure: EXCISION HYDRADENITIS GROIN;  Surgeon: [name], MD;  Location: [facility];  Service: General;  Laterality: N/A;  Excisioin of Hidradenitis Left groin    Family History  Problem Relation Age of Onset  . Hypertension Father   . Hypertension Sister   . Hypertension Brother     History  Substance Use Topics  . Smoking status: Current Every Day Smoker -- 0.25 packs/day for 10 years    Types: Cigarettes  . Smokeless tobacco: Never Used     Comment: 1 pack/week  . Alcohol Use: No    OB History   Grav Para  Term Preterm Abortions TAB SAB Ect Mult Living                 Review of Systems  Constitutional: Negative for fever and chills.  HENT: Positive for tinnitus. Negative for congestion, rhinorrhea and neck stiffness.   Eyes: Negative for visual disturbance.  Respiratory: Negative for cough.   Cardiovascular: Negative for chest pain.  Gastrointestinal: Negative for nausea, vomiting and abdominal pain.  Skin: Negative for rash.  Neurological: Positive for headaches. Negative for weakness and numbness.  All other systems reviewed and are negative.    Allergies  Penicillins  Home Medications   Current Outpatient Rx  Name  Route  Sig  Dispense  Refill  . albuterol (PROVENTIL HFA;VENTOLIN HFA) 108 (90 BASE) MCG/ACT inhaler   Inhalation   Inhale 1-2 puffs into the lungs every 6 (six) hours as needed for wheezing.   1 Inhaler   0   . ibuprofen (ADVIL,MOTRIN) 800 MG tablet   Oral   Take 800 mg by mouth 2 (two) times daily as needed for pain.         [name] insulin glargine (LANTUS) 100 UNIT/ML injection   Subcutaneous   Inject 40 Units into the skin 2 (two) times daily.          [name]  metFORMIN (GLUCOPHAGE) 500 MG tablet   Oral   Take 500 mg by mouth 2 (two) times daily with a meal.         . promethazine (PHENERGAN) 25 MG tablet   Oral   Take 1 tablet (25 mg total) by mouth every 6 (six) hours as needed for nausea.   20 tablet   0   . Lancets (FREESTYLE) lancets      Use as instructed   100 each   12     BP 117/79  Pulse 80  Temp(Src) 97.9 F (36.6 C) (Oral)  Resp 20  Ht 5\' 5"  (1.651 m)  Wt 170 lb (77.111 kg)  BMI 28.29 kg/m2  SpO2 98%  Physical Exam  Nursing note and vitals reviewed. Constitutional: She is oriented to person, place, and time. She appears well-developed and well-nourished. No distress.  HENT:  Head: Normocephalic and atraumatic.  Right Ear: Tympanic membrane normal.  Left Ear: Tympanic membrane normal.  Mouth/Throat: No oropharyngeal  exudate.  Eyes: Conjunctivae and EOM are normal. Pupils are equal, round, and reactive to light.  Neck: Normal range of motion. Neck supple.  Cardiovascular: Normal rate and regular rhythm.  Exam reveals no gallop and no friction rub.   No murmur heard. Pulmonary/Chest: Effort normal and breath sounds normal.  Abdominal: Soft. She exhibits no distension. There is no tenderness.  Musculoskeletal: Normal range of motion. She exhibits no edema and no tenderness.  Neurological: She is alert and oriented to person, place, and time. She has normal strength and normal reflexes. No cranial nerve deficit or sensory deficit. She displays a negative Romberg sign. Gait normal. GCS eye subscore is 4. GCS verbal subscore is 5. GCS motor subscore is 6.  Skin: Skin is warm and dry.  Psychiatric: She has a normal mood and affect.    ED Course  Procedures (including critical care time)  Labs Reviewed  COMPREHENSIVE METABOLIC PANEL - Abnormal; Notable for the following:    Glucose, Bld 455 (*)    Albumin 3.0 (*)    All other components within normal limits  GLUCOSE, CAPILLARY - Abnormal; Notable for the following:    Glucose-Capillary 431 (*)    All other components within normal limits  GLUCOSE, CAPILLARY - Abnormal; Notable for the following:    Glucose-Capillary 516 (*)    All other components within normal limits  GLUCOSE, CAPILLARY - Abnormal; Notable for the following:    Glucose-Capillary 238 (*)    All other components within normal limits  GLUCOSE, CAPILLARY - Abnormal; Notable for the following:    Glucose-Capillary 236 (*)    All other components within normal limits  CBC WITH DIFFERENTIAL   No results found.  1. Headache   2. Hyperglycemia   3. Tinnitus, bilateral     MDM  42 y/o female with history as noted below who presents with cc of headache. Was seen here on 04/01/12 and had a normal CT and LP. Don't suspect ICH or Meninginitis. Labs unremarkable except for hyperglycemia. No  evidence of DKA. The patient is neurologically intact. A MRV was obtained to evaluate for dural venous thrombosis which revealed no thrombosis or other acute pathology in discussion with radiologist (lamke). The patient's headache improved with IV compazine. No identifiable cause of the patient's headache or tinitus. Will refer to neurology. The patient was discharged home with referral instructions as well as strict return precautions.   Donita Brooks, MD 04/04/12 [phone]

## 2012-04-04 LAB — CSF CULTURE W GRAM STAIN: Culture: NO GROWTH

## 2012-04-04 NOTE — ED Notes (Signed)
CT paged to find out the delay in pt's MRI results.

## 2012-04-04 NOTE — ED Notes (Signed)
Pt is getting very irritated. Nurse explained why pt was waiting and asked pt how RN could make pt more comfortable right now, pt stated "I want to know what is going on." Pt is waiting for results from a MR venogram.

## 2012-04-04 NOTE — ED Notes (Signed)
Gave pt a contact case, so contacts wont be overlooked in specimin cups.

## 2012-04-04 NOTE — ED Notes (Signed)
MD Bringolf at bedside

## 2012-04-05 NOTE — ED Provider Notes (Signed)
  I performed a history and physical examination of Susan Horton and discussed her management with Dr. Jamse Arn.  I agree with the history, physical, assessment, and plan of care, with the following exceptions: None  I was present for the following procedures: None Time Spent in Critical Care of the patient: None Time spent in discussions with the patient and family: 8  Return visit for this 42 y.o. Female wit headache. Normal neurologic exam.  Preious visit with negative head ct and lp.  Patient with history of migraines but this is different.  MRI done to assess for dural vein thrombosis and negative. Discussed follow up with patient and precautions for return.   Virgina Evener, MD 04/05/12 1357

## 2012-04-14 ENCOUNTER — Encounter: Payer: Self-pay | Admitting: Internal Medicine

## 2012-04-14 ENCOUNTER — Ambulatory Visit (INDEPENDENT_AMBULATORY_CARE_PROVIDER_SITE_OTHER): Payer: BC Managed Care – PPO | Admitting: Internal Medicine

## 2012-04-14 VITALS — BP 124/70 | HR 97 | Temp 98.1°F | Resp 10 | Ht 65.75 in | Wt 169.0 lb

## 2012-04-14 DIAGNOSIS — Z794 Long term (current) use of insulin: Secondary | ICD-10-CM

## 2012-04-14 DIAGNOSIS — E119 Type 2 diabetes mellitus without complications: Secondary | ICD-10-CM

## 2012-04-14 LAB — HEMOGLOBIN A1C: Hgb A1c MFr Bld: 13.5 % — ABNORMAL HIGH (ref 4.6–6.5)

## 2012-04-14 MED ORDER — INSULIN ASPART 100 UNIT/ML ~~LOC~~ SOLN: SUBCUTANEOUS | Status: DC

## 2012-04-14 MED ORDER — GLUCOSE BLOOD VI STRP: ORAL_STRIP | Status: DC

## 2012-04-14 MED ORDER — INSULIN GLARGINE 100 UNIT/ML ~~LOC~~ SOLN: 40.0000 [IU] | Freq: Every day | SUBCUTANEOUS | Status: DC

## 2012-04-14 MED ORDER — "PEN NEEDLES 5/16"" 31G X 8 MM MISC": Status: DC

## 2012-04-14 MED ORDER — ONETOUCH ULTRA SYSTEM W/DEVICE KIT: 1.0000 | PACK | Freq: Once | Status: DC

## 2012-04-14 NOTE — Patient Instructions (Addendum)
Please reduce your Lantus dose to 40 units every night. Please start Novolog insulin at 10 units 15 minutes before each meal. Please use 5 units for a smaller meal (for e.g. your breakfast) Also, check sugars before very meal and inject the following correction: - for a pre-meal sugar 150-175: + 2 units - 176-200: + 3 units - 201-225: + 4 units - 226-250: + 5 units - 251-275: + 6 units - 276-300: + 7 units, etc. Stop Metformin. Please join MyChart. We will call you for the appointment with Diabetes educators for pump training.

## 2012-04-14 NOTE — Progress Notes (Signed)
Subjective:     Patient ID: Susan Horton, female   DOB: March 15, 1970, 42 y.o.   MRN: [id number]  HPI [name] is a 42 y/o woman presenting as advised by the ED physician for management of her DM (unclear if 1 or 2), insulin-dependent, uncontrolled, with complications (peripheral neuropathy).  She was dx in 1996 when pregnant with her daughter. Her sugars have been more uncontrolled in the last 8 months. She does not eat a lot, does not eat sweets. She has had multiple visits to the ED and admissions with hyperglycemia, but per review of her chart, not DKA.  She has had HAs in the last 1.5 mo, different than her regular migraines. HAs start suddenly. She can have them with orgasms, too. 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 She has been in the ED 2x for HAs. Today, 5/10, but can be 10/10. Head CT scan and MRV normal. Her sugars can be high or low with the HAs. No increased stress recently. She also has tinnitus and hears her pulse in her ears.   She has been on Insulin since 1999. She was on an insulin pump which she liked and her DM was controlled then, but lost her insurance. No DKA episodes. She is on a regimen of: - Lantus 40 units BID. Keeps insulin in the fridge even when in use. - Metformin 500 mg bid It is unclear whether she has type I or type 2 diabetes, the fact that she is on metformin would point towards type II, however she was told in the past that she has no pancreatic reserve.   She checks her sugars >3x a day: - am: 200s, occasionally 300s - before lunch: 250-300s - before bedtime: 300-400s Lowest: 200. She has hypoglycemia awareness even in 150. Her mother is a Microbiologist and she is aware of healthy eating habits, and she is trying to eat right. Her meals are: - Breakfast: usually skips, but she might have a bagel or fruit at work around 10 AM - Lunch: [place] sandwich, chips, tea - Dinner: grilled chicken, salad - Snacks: 2  No DR, last eye exem:  05/2011. + tingling/numbness in feet >> sees podiatry. She recently had a needle stuck in her R foot, which is only seen on x-rays, she had no awareness of it. This has been taken out by podiatry . No CKD. Last BUN/creatinine was 18/0.71 on 04/03/2012, when she came to the ED for headaches. She had sugars in the 300s to 500s at that time, glucose was 455 on the MP. CBC and LFTs were normal.  She does have blurry vision, has increased thirst and urination. She drinks a lot of water and Juicy Juice.   Has FH of DM2 in aunt, cousin. No AI ds in family.   She does not have a primary care physician, however now she has got insurance, and is looking for one.  Review of Systems Constitutional: no weight gain/loss, has fatigue, both subjective hyperthermia/hypothermia, hot flashes, poor sleep, increased urination, nocturia more than once Eyes: occasionally blurry vision, no xerophthalmia ENT: no sore throat, no nodules palpated in throat, no dysphagia/odynophagia, no hoarseness Cardiovascular: no CP/SOB/palpitations/+ swelling in hands and feet Respiratory: no cough/SOB Gastrointestinal: no N/V/D/C Musculoskeletal: no muscle/joint aches Skin: no rashes Neurological: no tremors/numbness/tingling/dizziness, + HA, has tinnitus Psychiatric: no depression/anxiety Low libido Past Medical History  Diagnosis Date  . Hidradenitis   . Asthma     prn inhaler  .  GERD (gastroesophageal reflux disease)   . Diabetes mellitus     IDDM  . Headache     migraines  HL  Anemia  Past Surgical History  Procedure Laterality Date  . Tonsillectomy    . Inguinal hidradenitis excision  07/06/2010    bilateral  . Exploratory laparotomy  08/14/2005    lysis of adhesions, drainage of tubo-ovarian abscess  . Breast reduction surgery  2002  . Appendectomy  1995  . Cholecystectomy  1993  . Tubal ligation  1996  . Vaginal hysterectomy  08/04/2005    and cysto  . Hydradenitis excision  02/08/2011    Procedure:  EXCISION HYDRADENITIS GROIN;  Surgeon: [name], MD;  Location: [facility];  Service: General;  Laterality: N/A;  Excisioin of Hidradenitis Left groin   History   Social History  . Marital Status: Single    Spouse Name: N/A    Number of Children: 4: 23,22,21,18   Occupational History  . Designer, industrial/product   Social History Main Topics  . Smoking status: Current Every Day Smoker -- 0.5 packs/day    Types: Cigarettes  . Smokeless tobacco: Never Used     Comment: 1 pack/week  . Alcohol Use: No  . Drug Use: No  . Sexually Active: Not on file   Other Topics Concern  . Not on file   Social History Narrative  . No narrative on file   Current Outpatient Prescriptions on File Prior to Visit  Medication Sig Dispense Refill  . albuterol (PROVENTIL HFA;VENTOLIN HFA) 108 (90 BASE) MCG/ACT inhaler Inhale 1-2 puffs into the lungs every 6 (six) hours as needed for wheezing.  1 Inhaler  0  . ibuprofen (ADVIL,MOTRIN) 800 MG tablet Take 800 mg by mouth 2 (two) times daily as needed for pain.      [name] insulin glargine (LANTUS) 100 UNIT/ML injection Inject 40 Units into the skin 2 (two) times daily.       . Lancets (FREESTYLE) lancets Use as instructed  100 each  12  . metFORMIN (GLUCOPHAGE) 500 MG tablet Take 500 mg by mouth 2 (two) times daily with a meal.      . promethazine (PHENERGAN) 25 MG tablet Take 1 tablet (25 mg total) by mouth every 6 (six) hours as needed for nausea.  20 tablet  0   No current facility-administered medications on file prior to visit.   Allergies  Allergen Reactions  . Penicillins Swelling and Rash   Family History  Problem Relation Age of Onset  . Hypertension Father   . Hypertension Sister   . Hypertension Brother    Objective:   Physical Exam BP 124/70  Pulse 97  Temp(Src) 98.1 F (36.7 C) (Oral)  Resp 10  Ht 5' 5.75" (1.67 m)  Wt 169 lb (76.658 kg)  BMI 27.49 kg/m2  SpO2 97% Wt Readings from Last 3 Encounters:  04/14/12  169 lb (76.658 kg)  04/03/12 170 lb (77.111 kg)  01/07/12 179 lb (81.194 kg)  Constitutional: overweight - abdominal, in NAD Eyes: PERRLA, EOMI, no exophthalmos ENT: moist mucous membranes, no thyromegaly, no cervical lymphadenopathy Cardiovascular: RRR, No MRG Respiratory: CTA B Gastrointestinal: abdomen soft, NT, ND, BS+ Musculoskeletal: no deformities, strength intact in all 4 Skin: moist, warm, no rashes Neurological: no tremor with outstretched hands, DTR normal in all 4    Assessment:     1. DM - unclear If type I or type II, insulin-dependent, uncontrolled, with complications - peripheral neuropathy  Plan:  Patient has been diagnosed with diabetes when she was 68, during her pregnancy, but it is unclear to me whether this is type I or type II. She has not have episodes of DKA, which would point more towards type 2 diabetes. I would like to check her C-peptide, however the patient mentions that she is rarely at or lower than 200, and the C-peptide measurement is only valid if the sugars are lower than that. Anti-GAD and ICA antibodies can also be tested. - we discussed about the fact that she was doing much better when she was on the pump in the past, and she would like to restart her pump. This would be much more convenient for her, since she does not want to take injections with her at work. She tells me that it would not be a problem to have the pump attached to her during work.  - I gave her a brochure with paperwork to fill out for the [employer] pump, I do not feel that for now she necessarily needs the CGM system - I also referred her for diabetes education for pre-pump training - I do she gets the pump, we need to start her on a better insulin regimen. She is now not taking any mealtime coverage, which is probably the cause for her high sugars. She is on a high dose of base of insulin. - I instructed her to: Stop Metformin. Please reduce your Lantus dose to 40 units every  night. Please start Novolog insulin at 10 units 15 minutes before each meal. Please use 5 units for a smaller meal (for e.g. your breakfast) Also, check sugars before very meal and inject the following correction: - for a pre-meal sugar 150-175: + 2 units - 176-200: + 3 units - 201-225: + 4 units - 226-250: + 5 units - 251-275: + 6 units - 276-300: + 7 units, etc.  - I sent prescriptions for Lantus, NovoLog, both in pen form as she prefers, to her pharmacy - I also sent pen needles Rx - Will check a hemoglobin A1c and a microalbumin/creatinine ratio today - Advise her to join my chart - given sugar log and advised how to fill it and to bring it at next appt - given foot care handout and explained the principles - given instructions for hypoglycemia management "15-15 rule" - Will see her back in a month with her sugar log  Office Visit on 04/14/2012  Component Date Value Range Status  . Hemoglobin A1C 04/14/2012 13.5 Repeated and verified X2.* 4.6 - 6.5 % Final   Glycemic Control Guidelines for People with Diabetes:Non Diabetic:  <6%Goal of Therapy: <7%Additional Action Suggested:  >8%   . Microalb, Ur 04/14/2012 8.5* 0.0 - 1.9 mg/dL Final  . Creatinine,U 04/14/2012 37.8   Final  . Microalb Creat Ratio 04/14/2012 22.5  0.0 - 30.0 mg/g Final   Letter sent.

## 2012-04-17 ENCOUNTER — Encounter: Payer: Self-pay | Admitting: Internal Medicine

## 2012-04-17 LAB — MICROALBUMIN / CREATININE URINE RATIO
Creatinine,U: 37.8 mg/dL
Microalb Creat Ratio: 22.5 mg/g (ref 0.0–30.0)
Microalb, Ur: 8.5 mg/dL — ABNORMAL HIGH (ref 0.0–1.9)

## 2012-05-03 ENCOUNTER — Other Ambulatory Visit: Payer: Self-pay | Admitting: Specialist

## 2012-05-03 DIAGNOSIS — G43719 Chronic migraine without aura, intractable, without status migrainosus: Secondary | ICD-10-CM

## 2012-05-09 ENCOUNTER — Ambulatory Visit
Admission: RE | Admit: 2012-05-09 | Discharge: 2012-05-09 | Disposition: A | Discharge status: Home / Self Care | Payer: BC Managed Care – PPO | Source: Ambulatory Visit | Attending: Specialist | Admitting: Specialist

## 2012-05-09 DIAGNOSIS — G43719 Chronic migraine without aura, intractable, without status migrainosus: Secondary | ICD-10-CM

## 2012-05-12 ENCOUNTER — Ambulatory Visit: Payer: BC Managed Care – PPO | Admitting: Internal Medicine

## 2012-05-12 DIAGNOSIS — Z0289 Encounter for other administrative examinations: Secondary | ICD-10-CM

## 2012-06-01 ENCOUNTER — Ambulatory Visit: Payer: BC Managed Care – PPO | Admitting: *Deleted

## 2012-08-17 ENCOUNTER — Telehealth (INDEPENDENT_AMBULATORY_CARE_PROVIDER_SITE_OTHER): Payer: Self-pay

## 2012-08-17 ENCOUNTER — Encounter (INDEPENDENT_AMBULATORY_CARE_PROVIDER_SITE_OTHER): Payer: BC Managed Care – PPO | Admitting: General Surgery

## 2012-08-17 NOTE — Telephone Encounter (Signed)
Pt calling to cancel appt in urgent office today.  She will need time to collect money for her co-pay.  Says she will call back tomorrow to schedule again.

## 2012-10-13 ENCOUNTER — Ambulatory Visit (INDEPENDENT_AMBULATORY_CARE_PROVIDER_SITE_OTHER): Payer: BC Managed Care – PPO | Admitting: *Deleted

## 2012-10-13 DIAGNOSIS — M722 Plantar fascial fibromatosis: Secondary | ICD-10-CM

## 2012-10-13 NOTE — Patient Instructions (Addendum)

## 2012-10-13 NOTE — Progress Notes (Signed)
Pt picked up orthotics and instructions were given for wearing.

## 2012-11-10 ENCOUNTER — Ambulatory Visit: Payer: BC Managed Care – PPO | Admitting: Podiatrist

## 2013-03-14 ENCOUNTER — Encounter (HOSPITAL_COMMUNITY): Payer: Self-pay | Admitting: Emergency Medicine

## 2013-03-14 DIAGNOSIS — F17200 Nicotine dependence, unspecified, uncomplicated: Secondary | ICD-10-CM | POA: Insufficient documentation

## 2013-03-14 DIAGNOSIS — R000 Tachycardia, unspecified: Secondary | ICD-10-CM | POA: Insufficient documentation

## 2013-03-14 DIAGNOSIS — Z9851 Tubal ligation status: Secondary | ICD-10-CM | POA: Insufficient documentation

## 2013-03-14 DIAGNOSIS — Z79899 Other long term (current) drug therapy: Secondary | ICD-10-CM | POA: Insufficient documentation

## 2013-03-14 DIAGNOSIS — L0231 Cutaneous abscess of buttock: Secondary | ICD-10-CM | POA: Insufficient documentation

## 2013-03-14 DIAGNOSIS — R51 Headache: Secondary | ICD-10-CM | POA: Insufficient documentation

## 2013-03-14 DIAGNOSIS — L03317 Cellulitis of buttock: Principal | ICD-10-CM

## 2013-03-14 DIAGNOSIS — R5381 Other malaise: Secondary | ICD-10-CM | POA: Insufficient documentation

## 2013-03-14 DIAGNOSIS — Z794 Long term (current) use of insulin: Secondary | ICD-10-CM | POA: Insufficient documentation

## 2013-03-14 DIAGNOSIS — Z880 Allergy status to penicillin: Secondary | ICD-10-CM | POA: Insufficient documentation

## 2013-03-14 DIAGNOSIS — E119 Type 2 diabetes mellitus without complications: Secondary | ICD-10-CM | POA: Insufficient documentation

## 2013-03-14 DIAGNOSIS — Z8719 Personal history of other diseases of the digestive system: Secondary | ICD-10-CM | POA: Insufficient documentation

## 2013-03-14 DIAGNOSIS — R110 Nausea: Secondary | ICD-10-CM | POA: Insufficient documentation

## 2013-03-14 DIAGNOSIS — Z9089 Acquired absence of other organs: Secondary | ICD-10-CM | POA: Insufficient documentation

## 2013-03-14 DIAGNOSIS — R5383 Other fatigue: Secondary | ICD-10-CM

## 2013-03-14 DIAGNOSIS — Z90710 Acquired absence of both cervix and uterus: Secondary | ICD-10-CM | POA: Insufficient documentation

## 2013-03-14 DIAGNOSIS — J45909 Unspecified asthma, uncomplicated: Secondary | ICD-10-CM | POA: Insufficient documentation

## 2013-03-14 DIAGNOSIS — R3911 Hesitancy of micturition: Secondary | ICD-10-CM | POA: Insufficient documentation

## 2013-03-14 DIAGNOSIS — Z872 Personal history of diseases of the skin and subcutaneous tissue: Secondary | ICD-10-CM | POA: Insufficient documentation

## 2013-03-14 LAB — URINALYSIS, ROUTINE W REFLEX MICROSCOPIC
Bilirubin Urine: NEGATIVE
Ketones, ur: NEGATIVE mg/dL
Leukocytes, UA: NEGATIVE
Nitrite: NEGATIVE
Protein, ur: NEGATIVE mg/dL
SPECIFIC GRAVITY, URINE: 1.029 (ref 1.005–1.030)
Urobilinogen, UA: 0.2 mg/dL (ref 0.0–1.0)
pH: 5.5 (ref 5.0–8.0)

## 2013-03-14 LAB — COMPREHENSIVE METABOLIC PANEL
ALK PHOS: 130 U/L — AB (ref 39–117)
ALT: 7 U/L (ref 0–35)
AST: 7 U/L (ref 0–37)
Albumin: 2.9 g/dL — ABNORMAL LOW (ref 3.5–5.2)
BUN: 29 mg/dL — ABNORMAL HIGH (ref 6–23)
CALCIUM: 8.9 mg/dL (ref 8.4–10.5)
CHLORIDE: 93 meq/L — AB (ref 96–112)
CO2: 25 meq/L (ref 19–32)
Creatinine, Ser: 0.84 mg/dL (ref 0.50–1.10)
GFR, EST NON AFRICAN AMERICAN: 85 mL/min — AB (ref >90)
GLUCOSE: 641 mg/dL — AB (ref 70–99)
Potassium: 4.4 mEq/L (ref 3.7–5.3)
SODIUM: 130 meq/L — AB (ref 137–147)
Total Protein: 7.4 g/dL (ref 6.0–8.3)

## 2013-03-14 LAB — CBC WITH DIFFERENTIAL/PLATELET
Basophils Absolute: 0 10*3/uL (ref 0.0–0.1)
Basophils Relative: 0 % (ref 0–1)
EOS PCT: 1 % (ref 0–5)
Eosinophils Absolute: 0.1 10*3/uL (ref 0.0–0.7)
HEMATOCRIT: 34.6 % — AB (ref 36.0–46.0)
Hemoglobin: 11.7 g/dL — ABNORMAL LOW (ref 12.0–15.0)
LYMPHS ABS: 3.9 10*3/uL (ref 0.7–4.0)
LYMPHS PCT: 43 % (ref 12–46)
MCH: 26.4 pg (ref 26.0–34.0)
MCHC: 33.8 g/dL (ref 30.0–36.0)
MCV: 78.1 fL (ref 78.0–100.0)
Monocytes Absolute: 0.4 10*3/uL (ref 0.1–1.0)
Monocytes Relative: 4 % (ref 3–12)
Neutro Abs: 4.7 10*3/uL (ref 1.7–7.7)
Neutrophils Relative %: 52 % (ref 43–77)
PLATELETS: 352 10*3/uL (ref 150–400)
RBC: 4.43 MIL/uL (ref 3.87–5.11)
RDW: 12.5 % (ref 11.5–15.5)
WBC: 9 10*3/uL (ref 4.0–10.5)

## 2013-03-14 LAB — URINE MICROSCOPIC-ADD ON

## 2013-03-14 LAB — CBG MONITORING, ED: Glucose-Capillary: 593 mg/dL (ref 70–99)

## 2013-03-14 NOTE — ED Notes (Signed)
Pt. reports left flank pain for 3 days , denies injury or fall , no urinary discomfort , pt. also reported elevated blood sugar ( 300+)  this afternoon.

## 2013-03-15 ENCOUNTER — Emergency Department (HOSPITAL_COMMUNITY)
Admission: EM | Admit: 2013-03-15 | Discharge: 2013-03-15 | Disposition: A | Discharge status: Home / Self Care | Payer: BC Managed Care – PPO | Attending: Emergency Medicine | Admitting: Emergency Medicine

## 2013-03-15 ENCOUNTER — Emergency Department (HOSPITAL_COMMUNITY): Payer: BC Managed Care – PPO

## 2013-03-15 DIAGNOSIS — R109 Unspecified abdominal pain: Secondary | ICD-10-CM

## 2013-03-15 DIAGNOSIS — R739 Hyperglycemia, unspecified: Secondary | ICD-10-CM

## 2013-03-15 DIAGNOSIS — L0231 Cutaneous abscess of buttock: Secondary | ICD-10-CM

## 2013-03-15 LAB — TROPONIN I: Troponin I: <0.3 ng/mL (ref <0.30)

## 2013-03-15 LAB — D-DIMER, QUANTITATIVE (NOT AT ARMC): D DIMER QUANT: 0.37 ug{FEU}/mL (ref 0.00–0.48)

## 2013-03-15 LAB — CBG MONITORING, ED: Glucose-Capillary: 188 mg/dL — ABNORMAL HIGH (ref 70–99)

## 2013-03-15 MED ORDER — INSULIN ASPART 100 UNIT/ML ~~LOC~~ SOLN
10.0000 [IU] | Freq: Once | SUBCUTANEOUS | Status: AC
  Administered 2013-03-15: 10 [IU] via INTRAVENOUS
  Filled 2013-03-15: qty 1

## 2013-03-15 MED ORDER — SODIUM CHLORIDE 0.9 % IV BOLUS (SEPSIS)
1000.0000 mL | Freq: Once | INTRAVENOUS | Status: AC
  Administered 2013-03-15: 1000 mL via INTRAVENOUS

## 2013-03-15 MED ORDER — CLINDAMYCIN HCL 300 MG PO CAPS: 300.0000 mg | ORAL_CAPSULE | Freq: Four times a day (QID) | ORAL | Status: DC

## 2013-03-15 MED ORDER — ONDANSETRON HCL 4 MG/2ML IJ SOLN
4.0000 mg | Freq: Once | INTRAMUSCULAR | Status: AC
  Administered 2013-03-15: 4 mg via INTRAVENOUS
  Filled 2013-03-15: qty 2

## 2013-03-15 MED ORDER — MORPHINE SULFATE 4 MG/ML IJ SOLN
4.0000 mg | Freq: Once | INTRAMUSCULAR | Status: AC
  Administered 2013-03-15: 4 mg via INTRAVENOUS
  Filled 2013-03-15: qty 1

## 2013-03-15 MED ORDER — HYDROCODONE-ACETAMINOPHEN 5-325 MG PO TABS: 2.0000 | ORAL_TABLET | ORAL | Status: DC | PRN

## 2013-03-15 MED ORDER — FLUCONAZOLE 150 MG PO TABS: 150.0000 mg | ORAL_TABLET | Freq: Once | ORAL | Status: DC

## 2013-03-15 MED ORDER — INSULIN ASPART 100 UNIT/ML IV SOLN: 10.0000 [IU] | Freq: Once | INTRAVENOUS | Status: DC

## 2013-03-15 NOTE — Discharge Instructions (Signed)
Followup with your primary MD to have your wound and blood sugar rechecked. Return immediately for worsening pain, fever, chills, persistently elevated blood sugar, abdominal pain or for any concerns.  Abscess An abscess is an infected area that contains a collection of pus and debris.It can occur in almost any part of the body. An abscess is also known as a furuncle or boil. CAUSES  An abscess occurs when tissue gets infected. This can occur from blockage of oil or sweat glands, infection of hair follicles, or a minor injury to the skin. As the body tries to fight the infection, pus collects in the area and creates pressure under the skin. This pressure causes pain. People with weakened immune systems have difficulty fighting infections and get certain abscesses more often.  SYMPTOMS Usually an abscess develops on the skin and becomes a painful mass that is red, warm, and tender. If the abscess forms under the skin, you may feel a moveable soft area under the skin. Some abscesses break open (rupture) on their own, but most will continue to get worse without care. The infection can spread deeper into the body and eventually into the bloodstream, causing you to feel ill.  DIAGNOSIS  Your caregiver will take your medical history and perform a physical exam. A sample of fluid may also be taken from the abscess to determine what is causing your infection. TREATMENT  Your caregiver may prescribe antibiotic medicines to fight the infection. However, taking antibiotics alone usually does not cure an abscess. Your caregiver may need to make a small cut (incision) in the abscess to drain the pus. In some cases, gauze is packed into the abscess to reduce pain and to continue draining the area. HOME CARE INSTRUCTIONS   Only take over-the-counter or prescription medicines for pain, discomfort, or fever as directed by your caregiver.  If you were prescribed antibiotics, take them as directed. Finish them even if  you start to feel better.  If gauze is used, follow your caregiver's directions for changing the gauze.  To avoid spreading the infection:  Keep your draining abscess covered with a bandage.  Wash your hands well.  Do not share personal care items, towels, or whirlpools with others.  Avoid skin contact with others.  Keep your skin and clothes clean around the abscess.  Keep all follow-up appointments as directed by your caregiver. SEEK MEDICAL CARE IF:   You have increased pain, swelling, redness, fluid drainage, or bleeding.  You have muscle aches, chills, or a general ill feeling.  You have a fever. MAKE SURE YOU:   Understand these instructions.  Will watch your condition.  Will get help right away if you are not doing well or get worse. Document Released: 10/07/2004 Document Revised: 06/29/2011 Document Reviewed: 03/12/2011 [facility] Patient Information 2014 [place].  Hyperglycemia Hyperglycemia occurs when the glucose (sugar) in your blood is too high. Hyperglycemia can happen for many reasons, but it most often happens to people who do not know they have diabetes or are not managing their diabetes properly.  CAUSES  Whether you have diabetes or not, there are other causes of hyperglycemia. Hyperglycemia can occur when you have diabetes, but it can also occur in other situations that you might not be as aware of, such as: Diabetes  If you have diabetes and are having problems controlling your blood glucose, hyperglycemia could occur because of some of the following reasons:  Not following your meal plan.  Not taking your diabetes medications or  not taking it properly.  Exercising less or doing less activity than you normally do.  Being sick. Pre-diabetes  This cannot be ignored. Before people develop Type 2 diabetes, they almost always have "pre-diabetes." This is when your blood glucose levels are higher than normal, but not yet high enough to be  diagnosed as diabetes. Research has shown that some long-term damage to the body, especially the heart and circulatory system, may already be occurring during pre-diabetes. If you take action to manage your blood glucose when you have pre-diabetes, you may delay or prevent Type 2 diabetes from developing. Stress  If you have diabetes, you may be "diet" controlled or on oral medications or insulin to control your diabetes. However, you may find that your blood glucose is higher than usual in the hospital whether you have diabetes or not. This is often referred to as "stress hyperglycemia." Stress can elevate your blood glucose. This happens because of hormones put out by the body during times of stress. If stress has been the cause of your high blood glucose, it can be followed regularly by your caregiver. That way he/she can make sure your hyperglycemia does not continue to get worse or progress to diabetes. Steroids  Steroids are medications that act on the infection fighting system (immune system) to block inflammation or infection. One side effect can be a rise in blood glucose. Most people can produce enough extra insulin to allow for this rise, but for those who cannot, steroids make blood glucose levels go even higher. It is not unusual for steroid treatments to "uncover" diabetes that is developing. It is not always possible to determine if the hyperglycemia will go away after the steroids are stopped. A special blood test called an A1c is sometimes done to determine if your blood glucose was elevated before the steroids were started. SYMPTOMS  Thirsty.  Frequent urination.  Dry mouth.  Blurred vision.  Tired or fatigue.  Weakness.  Sleepy.  Tingling in feet or leg. DIAGNOSIS  Diagnosis is made by monitoring blood glucose in one or all of the following ways:  A1c test. This is a chemical found in your blood.  Fingerstick blood glucose monitoring.  Laboratory results. TREATMENT    First, knowing the cause of the hyperglycemia is important before the hyperglycemia can be treated. Treatment may include, but is not be limited to:  Education.  Change or adjustment in medications.  Change or adjustment in meal plan.  Treatment for an illness, infection, etc.  More frequent blood glucose monitoring.  Change in exercise plan.  Decreasing or stopping steroids.  Lifestyle changes. HOME CARE INSTRUCTIONS   Test your blood glucose as directed.  Exercise regularly. Your caregiver will give you instructions about exercise. Pre-diabetes or diabetes which comes on with stress is helped by exercising.  Eat wholesome, balanced meals. Eat often and at regular, fixed times. Your caregiver or nutritionist will give you a meal plan to guide your sugar intake.  Being at an ideal weight is important. If needed, losing as little as 10 to 15 pounds may help improve blood glucose levels. SEEK MEDICAL CARE IF:   You have questions about medicine, activity, or diet.  You continue to have symptoms (problems such as increased thirst, urination, or weight gain). SEEK IMMEDIATE MEDICAL CARE IF:   You are vomiting or have diarrhea.  Your breath smells fruity.  You are breathing faster or slower.  You are very sleepy or incoherent.  You have numbness, tingling, or  pain in your feet or hands.  You have chest pain.  Your symptoms get worse even though you have been following your caregiver's orders.  If you have any other questions or concerns. Document Released: 06/23/2000 Document Revised: 03/22/2011 Document Reviewed: 04/26/2011 [facility] Patient Information 2014 [place], [place].

## 2013-03-15 NOTE — ED Provider Notes (Signed)
CSN: [id number]     Arrival date & time 03/14/13  2306 History   First MD Initiated Contact with Patient 03/15/13 0006     Chief Complaint  Patient presents with  . Flank Pain  . Hyperglycemia     (Consider location/radiation/quality/duration/timing/severity/associated sxs/prior Treatment) HPI Patient presents with 3 days of gradual onset left flank pain. She states the pain is worse with deep inspiration. She denies any shortness of breath or cough. She's not had previous pain. Denies any trauma. She does have urinary frequency and hesitancy but denies any dysuria. She's had some mild nausea without any vomiting or diarrhea. She denies any abdominal pain. She's had no fevers or chills. She reports consistently elevated blood sugar for the past week. She also has had an intermittently draining gluteal abscess. She says this is recurrent.  Lastly patient also complains of a bitemporal throbbing headaches associated with photophobia and nausea. She's this is similar to previous migraines in the past headache was gradual onset she denies any neck pain or stiffness. Past Medical History  Diagnosis Date  . Hidradenitis   . Asthma     prn inhaler  . GERD (gastroesophageal reflux disease)   . Diabetes mellitus     IDDM  . Headache(784.0)     migraines   Past Surgical History  Procedure Laterality Date  . Tonsillectomy    . Inguinal hidradenitis excision  07/06/2010    bilateral  . Exploratory laparotomy  08/14/2005    lysis of adhesions, drainage of tubo-ovarian abscess  . Breast reduction surgery  2002  . Appendectomy  1995  . Cholecystectomy  1993  . Tubal ligation  1996  . Vaginal hysterectomy  08/04/2005    and cysto  . Hydradenitis excision  02/08/2011    Procedure: EXCISION HYDRADENITIS GROIN;  Surgeon: [name], MD;  Location: [facility];  Service: General;  Laterality: N/A;  Excisioin of Hidradenitis Left groin   Family History  Problem Relation Age of  Onset  . Hypertension Father   . Hypertension Sister   . Hypertension Brother    History  Substance Use Topics  . Smoking status: Current Every Day Smoker -- 0.25 packs/day for 10 years    Types: Cigarettes  . Smokeless tobacco: Never Used     Comment: 1 pack/week  . Alcohol Use: No   OB History   Grav Para Term Preterm Abortions TAB SAB Ect Mult Living                 Review of Systems  Constitutional: Positive for fatigue. Negative for fever and chills.  HENT: Negative for congestion and sinus pressure.   Respiratory: Negative for cough and shortness of breath.   Cardiovascular: Negative for chest pain and leg swelling.  Gastrointestinal: Positive for nausea. Negative for vomiting, abdominal pain, diarrhea and constipation.  Genitourinary: Positive for frequency and flank pain. Negative for dysuria, vaginal bleeding and vaginal discharge.  Musculoskeletal: Positive for back pain. Negative for myalgias, neck pain and neck stiffness.  Skin: Negative for rash and wound.  Neurological: Positive for headaches. Negative for dizziness, syncope, weakness and numbness.  All other systems reviewed and are negative.      Allergies  Penicillins  Home Medications   Current Outpatient Rx  Name  Route  Sig  Dispense  Refill  . albuterol (PROVENTIL HFA;VENTOLIN HFA) 108 (90 BASE) MCG/ACT inhaler   Inhalation   Inhale 1-2 puffs into the lungs every 6 (six) hours as needed  for wheezing.   1 Inhaler   0   . Blood Glucose Monitoring Suppl (ONE TOUCH ULTRA SYSTEM KIT) W/DEVICE KIT   Does not apply   1 kit by Does not apply route once.   1 each   0   . glucose blood (ONE TOUCH ULTRA TEST) test strip      Use as instructed   200 each   12   . ibuprofen (ADVIL,MOTRIN) 800 MG tablet   Oral   Take 800 mg by mouth 2 (two) times daily as needed for pain.         [name] insulin aspart (NOVOLOG) 100 UNIT/ML injection      Inject up to 50 units a day as advised.   5 pen   12   .  insulin glargine (LANTUS) 100 UNIT/ML injection   Subcutaneous   Inject 0.4 mLs (40 Units total) into the skin at bedtime.   15 mL   1     Lantus Solostar   . Insulin Pen Needle (PEN NEEDLES 31GX5/16") 31G X 8 MM MISC      Use 4 times a day   200 each   prn   . Lancets (FREESTYLE) lancets      Use as instructed   100 each   12    BP 120/67  Pulse 98  Temp(Src) 98.5 F (36.9 C) (Oral)  Resp 15  SpO2 98% Physical Exam  Nursing note and vitals reviewed. Constitutional: She is oriented to person, place, and time. She appears well-developed and well-nourished. No distress.  HENT:  Head: Normocephalic and atraumatic.  Mouth/Throat: Oropharynx is clear and moist. No oropharyngeal exudate.  Eyes: EOM are normal. Pupils are equal, round, and reactive to light.  Neck: Normal range of motion. Neck supple.  No meningismus  Cardiovascular: Regular rhythm.  Exam reveals no gallop and no friction rub.   No murmur heard. Mild tachycardia  Pulmonary/Chest: Effort normal and breath sounds normal. No respiratory distress. She has no wheezes. She has no rales. She exhibits no tenderness.  Abdominal: Soft. Bowel sounds are normal. She exhibits no distension and no mass. There is no tenderness. There is no rebound and no guarding.  Musculoskeletal: Normal range of motion. She exhibits no edema and no tenderness.  No midline thoracic or lumbar tenderness. No CVA tenderness bilaterally. No calf swelling or tenderness.  Left buttock abscess this is open and minimally draining purulent fluid. There is induration in the area.  Neurological: She is alert and oriented to person, place, and time.  Patient is alert and oriented x3 with clear, goal oriented speech. Patient has 5/5 motor in all extremities. Sensation is intact to light touch.    Skin: Skin is warm and dry. No rash noted. No erythema.    ED Course  INCISION AND DRAINAGE Date/Time: 03/15/2013 5:46 AM Performed by: Julianne Rice Authorized by: Julianne Rice Consent: Verbal consent obtained. Type: abscess Body area: anogenital Location details: gluteal cleft Local anesthetic: lidocaine 2% with epinephrine Anesthetic total: 5 ml Patient sedated: no Scalpel size: 11 Needle gauge: 22 Incision type: single straight Complexity: simple Drainage: purulent Drainage amount: moderate Wound treatment: wound left open Packing material: none Patient tolerance: Patient tolerated the procedure well with no immediate complications.   (including critical care time) Labs Review Labs Reviewed  URINALYSIS, ROUTINE W REFLEX MICROSCOPIC - Abnormal; Notable for the following:    APPearance CLOUDY (*)    Glucose, UA >1000 (*)    Hgb urine  dipstick TRACE (*)    All other components within normal limits  CBC WITH DIFFERENTIAL - Abnormal; Notable for the following:    Hemoglobin 11.7 (*)    HCT 34.6 (*)    All other components within normal limits  COMPREHENSIVE METABOLIC PANEL - Abnormal; Notable for the following:    Sodium 130 (*)    Chloride 93 (*)    Glucose, Bld 641 (*)    BUN 29 (*)    Albumin 2.9 (*)    Alkaline Phosphatase 130 (*)    Total Bilirubin <0.2 (*)    GFR calc non Af Amer 85 (*)    All other components within normal limits  URINE MICROSCOPIC-ADD ON - Abnormal; Notable for the following:    Squamous Epithelial / LPF MANY (*)    All other components within normal limits  CBG MONITORING, ED - Abnormal; Notable for the following:    Glucose-Capillary 593 (*)    All other components within normal limits  CBG MONITORING, ED - Abnormal; Notable for the following:    Glucose-Capillary 188 (*)    All other components within normal limits  D-DIMER, QUANTITATIVE  TROPONIN I   Imaging Review Dg Abd Acute W/chest  03/15/2013   CLINICAL DATA:  Thoracic and abdominal pain.  EXAM: ACUTE ABDOMEN SERIES (ABDOMEN 2 VIEW & CHEST 1 VIEW)  COMPARISON:  03/16/2012  FINDINGS: There is no evidence of dilated  bowel loops or free intraperitoneal air. Moderate volume of formed stool. Cholecystectomy changes. No radiopaque calculi or other significant radiographic abnormality is seen. Heart size and mediastinal contours are within normal limits. Both lungs are clear.  IMPRESSION: Negative abdominal radiographs.  No acute cardiopulmonary disease.   Electronically Signed   By: Jorje Guild M.D.   On: 03/15/2013 02:01     EKG Interpretation   Date/Time:  Thursday March 15 2013 00:33:00 EST Ventricular Rate:  89 PR Interval:  161 QRS Duration: 79 QT Interval:  335 QTC Calculation: 408 R Axis:   76 Text Interpretation:  Sinus rhythm Confirmed by Labrittany Wechter  MD, Crewe Heathman  ([id number]) on 03/15/2013 1:18:16 AM      MDM   Final diagnoses:  None   Patient's pain is controlled with narcotic pain medication. Her abdomen or name soft and nontender. Question whether her episodic pain is due to constipation. Gen. moderate amount of formed stool on her x-ray. Patient has recurrent abscesses in the buttock area. Incision and drainage was made. Patient is advised to follow with her primary MD to recheck in sugar and abscess. Given the patient is a poorly controlled diabetic we'll start patient on antibiotics for the abscess. Return precautions have been given the patient is voiced understanding.    Julianne Rice, MD 03/15/13 [phone]

## 2013-03-15 NOTE — ED Notes (Signed)
Dr. yelverton at bedside. 

## 2013-03-15 NOTE — ED Notes (Signed)
Dr. Lita Mains at bedside performing incision and drainage of patients right buttocks abscess.

## 2013-03-15 NOTE — ED Notes (Signed)
Dr Yelverton at bedside.  

## 2013-03-21 ENCOUNTER — Encounter (HOSPITAL_COMMUNITY): Payer: Self-pay | Admitting: Emergency Medicine

## 2013-03-21 ENCOUNTER — Emergency Department (HOSPITAL_COMMUNITY)
Admission: EM | Admit: 2013-03-21 | Discharge: 2013-03-21 | Disposition: A | Discharge status: Home / Self Care | Payer: BC Managed Care – PPO | Attending: Emergency Medicine | Admitting: Emergency Medicine

## 2013-03-21 DIAGNOSIS — Z794 Long term (current) use of insulin: Secondary | ICD-10-CM | POA: Insufficient documentation

## 2013-03-21 DIAGNOSIS — Z79899 Other long term (current) drug therapy: Secondary | ICD-10-CM | POA: Insufficient documentation

## 2013-03-21 DIAGNOSIS — Z8679 Personal history of other diseases of the circulatory system: Secondary | ICD-10-CM | POA: Insufficient documentation

## 2013-03-21 DIAGNOSIS — F17200 Nicotine dependence, unspecified, uncomplicated: Secondary | ICD-10-CM | POA: Insufficient documentation

## 2013-03-21 DIAGNOSIS — Z8719 Personal history of other diseases of the digestive system: Secondary | ICD-10-CM | POA: Insufficient documentation

## 2013-03-21 DIAGNOSIS — J45909 Unspecified asthma, uncomplicated: Secondary | ICD-10-CM | POA: Insufficient documentation

## 2013-03-21 DIAGNOSIS — E119 Type 2 diabetes mellitus without complications: Secondary | ICD-10-CM | POA: Insufficient documentation

## 2013-03-21 DIAGNOSIS — R739 Hyperglycemia, unspecified: Secondary | ICD-10-CM

## 2013-03-21 DIAGNOSIS — Z872 Personal history of diseases of the skin and subcutaneous tissue: Secondary | ICD-10-CM | POA: Insufficient documentation

## 2013-03-21 DIAGNOSIS — Z792 Long term (current) use of antibiotics: Secondary | ICD-10-CM | POA: Insufficient documentation

## 2013-03-21 DIAGNOSIS — Z880 Allergy status to penicillin: Secondary | ICD-10-CM | POA: Insufficient documentation

## 2013-03-21 LAB — COMPREHENSIVE METABOLIC PANEL
ALK PHOS: 135 U/L — AB (ref 39–117)
ALT: 17 U/L (ref 0–35)
AST: 10 U/L (ref 0–37)
Albumin: 3 g/dL — ABNORMAL LOW (ref 3.5–5.2)
BILIRUBIN TOTAL: 0.2 mg/dL — AB (ref 0.3–1.2)
BUN: 19 mg/dL (ref 6–23)
CHLORIDE: 94 meq/L — AB (ref 96–112)
CO2: 24 meq/L (ref 19–32)
Calcium: 9.4 mg/dL (ref 8.4–10.5)
Creatinine, Ser: 0.79 mg/dL (ref 0.50–1.10)
GFR calc non Af Amer: >90 mL/min (ref >90)
GLUCOSE: 600 mg/dL — AB (ref 70–99)
POTASSIUM: 4.3 meq/L (ref 3.7–5.3)
Sodium: 132 mEq/L — ABNORMAL LOW (ref 137–147)
Total Protein: 7.5 g/dL (ref 6.0–8.3)

## 2013-03-21 LAB — CBC
HEMATOCRIT: 36.1 % (ref 36.0–46.0)
HEMOGLOBIN: 12.1 g/dL (ref 12.0–15.0)
MCH: 26.4 pg (ref 26.0–34.0)
MCHC: 33.5 g/dL (ref 30.0–36.0)
MCV: 78.6 fL (ref 78.0–100.0)
Platelets: 386 10*3/uL (ref 150–400)
RBC: 4.59 MIL/uL (ref 3.87–5.11)
RDW: 12.7 % (ref 11.5–15.5)
WBC: 8.8 10*3/uL (ref 4.0–10.5)

## 2013-03-21 LAB — I-STAT VENOUS BLOOD GAS, ED
Acid-base deficit: 1 mmol/L (ref 0.0–2.0)
Bicarbonate: 25.2 mEq/L — ABNORMAL HIGH (ref 20.0–24.0)
O2 Saturation: 36 %
TCO2: 27 mmol/L (ref 0–100)
pCO2, Ven: 48 mmHg (ref 45.0–50.0)
pH, Ven: 7.329 — ABNORMAL HIGH (ref 7.250–7.300)
pO2, Ven: 23 mmHg — CL (ref 30.0–45.0)

## 2013-03-21 LAB — URINALYSIS, ROUTINE W REFLEX MICROSCOPIC
Bilirubin Urine: NEGATIVE
Glucose, UA: >1000 mg/dL — AB
KETONES UR: NEGATIVE mg/dL
Leukocytes, UA: NEGATIVE
NITRITE: NEGATIVE
PROTEIN: 30 mg/dL — AB
Specific Gravity, Urine: 1.033 — ABNORMAL HIGH (ref 1.005–1.030)
Urobilinogen, UA: 0.2 mg/dL (ref 0.0–1.0)
pH: 5 (ref 5.0–8.0)

## 2013-03-21 LAB — CBG MONITORING, ED
GLUCOSE-CAPILLARY: 316 mg/dL — AB (ref 70–99)
Glucose-Capillary: 567 mg/dL (ref 70–99)
Glucose-Capillary: 353 mg/dL — ABNORMAL HIGH (ref 70–99)

## 2013-03-21 LAB — URINE MICROSCOPIC-ADD ON

## 2013-03-21 MED ORDER — SODIUM CHLORIDE 0.9 % IV BOLUS (SEPSIS)
1000.0000 mL | Freq: Once | INTRAVENOUS | Status: AC
  Administered 2013-03-21: 1000 mL via INTRAVENOUS

## 2013-03-21 MED ORDER — INSULIN ASPART 100 UNIT/ML ~~LOC~~ SOLN
6.0000 [IU] | Freq: Once | SUBCUTANEOUS | Status: AC
  Administered 2013-03-21: 6 [IU] via INTRAVENOUS

## 2013-03-21 NOTE — ED Notes (Signed)
Pt sts over the past 2 months she has had a hard time getting her blood sugar under control. Was at PCP office today, they checked CBG and it was reading high on their machine. sts this morning she checked it and it was reading in 300s. Pt c/o HA and n/v. Hx of DM, takes lantus and has taken her dose today. Nad, skin warm and dry, resp e/u.

## 2013-03-21 NOTE — ED Provider Notes (Signed)
Medical screening examination/treatment/procedure(s) were performed by non-physician practitioner and as supervising physician I was immediately available for consultation/collaboration.   EKG Interpretation None        Wandra Arthurs, MD 03/21/13 2101

## 2013-03-21 NOTE — ED Provider Notes (Signed)
Donne Baley S 8:00 PM the patient discussed in sign out. Patient with elevated blood sugar responding well to treatments in the emergency room. No signs for DKA. Patient is finishing additional fluid bolus with plans for a recheck of blood sugar. His sugar continues to improve she may be discharged home. She does have her Lantus at home to continue using and we'll plan to followup with her PCP by calling the office tomorrow.   9:00 PM sugar continues to drop it is now around 300. Patient continues to appear well. She will be discharged at this time.  Martie Lee, PA-C 03/21/13 2100

## 2013-03-21 NOTE — ED Provider Notes (Signed)
CSN: [id number]     Arrival date & time 03/21/13  1346 History   First MD Initiated Contact with Patient 03/21/13 1736     Chief Complaint  Patient presents with  . Hyperglycemia     (Consider location/radiation/quality/duration/timing/severity/associated sxs/prior Treatment) HPI: Susan Horton is a 43 year old woman with a past medical history of poorly controlled, insulin dependent Type 2 DM who presents to the ED today with a chief complaint of hyperglycemia.  Susan Horton reports that Susan Horton went to her primary care doctor today, where her blood sugar was found to be over 400, at which point her doctor requested that Susan Horton come to the ED.  Susan Horton states that Susan Horton has had multiple hospital admissions for high blood sugars in the past few months, and that over the last few days home blood glucose readings have been higher than usual (up to 460s yesterday).  Susan Horton reports headache, lightheadedness, fatigue, excessive thirst, dry mouth, and frequent urination at baseline but all increased over the last few days.  Susan Horton denies recent illness and fevers and endorses some stress related to missing work because of her high blood sugars.  Susan Horton states that Susan Horton takes 30U lantus with a sliding scale at home 2-3 times per day and that Susan Horton is always adherent to her medication regimen.  Susan Horton states that her blood sugar has been out of control since Susan Horton lost her insurance and had to come off of an insulin pump.    Past Medical History  Diagnosis Date  . Hidradenitis   . Asthma     prn inhaler  . GERD (gastroesophageal reflux disease)   . Diabetes mellitus     IDDM  . Headache(784.0)     migraines   Past Surgical History  Procedure Laterality Date  . Tonsillectomy    . Inguinal hidradenitis excision  07/06/2010    bilateral  . Exploratory laparotomy  08/14/2005    lysis of adhesions, drainage of tubo-ovarian abscess  . Breast reduction surgery  2002  . Appendectomy  1995  . Cholecystectomy  1993  . Tubal ligation  1996  .  Vaginal hysterectomy  08/04/2005    and cysto  . Hydradenitis excision  02/08/2011    Procedure: EXCISION HYDRADENITIS GROIN;  Surgeon: [name], MD;  Location: [facility];  Service: General;  Laterality: N/A;  Excisioin of Hidradenitis Left groin   Family History  Problem Relation Age of Onset  . Hypertension Father   . Hypertension Sister   . Hypertension Brother    History  Substance Use Topics  . Smoking status: Current Every Day Smoker -- 0.25 packs/day for 10 years    Types: Cigarettes  . Smokeless tobacco: Never Used     Comment: 1 pack/week  . Alcohol Use: No   OB History   Grav Para Term Preterm Abortions TAB SAB Ect Mult Living                 Review of Systems    Allergies  Penicillins  Home Medications   Current Outpatient Rx  Name  Route  Sig  Dispense  Refill  . albuterol (PROVENTIL HFA;VENTOLIN HFA) 108 (90 BASE) MCG/ACT inhaler   Inhalation   Inhale 1-2 puffs into the lungs every 6 (six) hours as needed for wheezing.   1 Inhaler   0   . insulin glargine (LANTUS) 100 UNIT/ML injection   Subcutaneous   Inject 0.4 mLs (40 Units total) into the skin at bedtime.  15 mL   1     Lantus Solostar   . clindamycin (CLEOCIN) 300 MG capsule   Oral   Take 1 capsule (300 mg total) by mouth 4 (four) times daily. X 7 days   28 capsule   0   . HYDROcodone-acetaminophen (NORCO) 5-325 MG per tablet   Oral   Take 2 tablets by mouth every 4 (four) hours as needed for severe pain.   10 tablet   0    BP 139/78  Pulse 82  Temp(Src) 98.7 F (37.1 C) (Oral)  Resp 18  SpO2 100% Physical Exam  Nursing note and vitals reviewed. Constitutional: Susan Horton is oriented to person, place, and time. Susan Horton appears well-developed and well-nourished. No distress.  HENT:  Head: Normocephalic and atraumatic.  Mouth/Throat: Oropharynx is clear and moist.  Oral mucus membranes moist.   Eyes: Pupils are equal, round, and reactive to light.  Neck: Normal  range of motion. Neck supple.  Cardiovascular: Normal rate, regular rhythm and normal heart sounds.   Pulmonary/Chest: Effort normal and breath sounds normal. No respiratory distress.  Abdominal: Soft. Bowel sounds are normal. Susan Horton exhibits no distension and no mass. There is no tenderness. There is no rebound and no guarding.  Musculoskeletal: Susan Horton exhibits no edema.  Neurological: Susan Horton is alert and oriented to person, place, and time.  Skin: Skin is warm and dry. No rash noted. No erythema.    ED Course  Procedures (including critical care time) Labs Review Labs Reviewed  COMPREHENSIVE METABOLIC PANEL - Abnormal; Notable for the following:    Sodium 132 (*)    Chloride 94 (*)    Glucose, Bld 600 (*)    Albumin 3.0 (*)    Alkaline Phosphatase 135 (*)    Total Bilirubin 0.2 (*)    All other components within normal limits  URINALYSIS, ROUTINE W REFLEX MICROSCOPIC - Abnormal; Notable for the following:    Specific Gravity, Urine 1.033 (*)    Glucose, UA >1000 (*)    Hgb urine dipstick TRACE (*)    Protein, ur 30 (*)    All other components within normal limits  URINE MICROSCOPIC-ADD ON - Abnormal; Notable for the following:    Squamous Epithelial / LPF FEW (*)    All other components within normal limits  CBG MONITORING, ED - Abnormal; Notable for the following:    Glucose-Capillary 567 (*)    All other components within normal limits  I-STAT VENOUS BLOOD GAS, ED - Abnormal; Notable for the following:    pH, Ven 7.329 (*)    pO2, Ven 23.0 (*)    Bicarbonate 25.2 (*)    All other components within normal limits  CBG MONITORING, ED - Abnormal; Notable for the following:    Glucose-Capillary 353 (*)    All other components within normal limits  CBC     Patient is able to the home due to the fact that Susan Horton is not acidotic and not in DKA.  Patient is mentating appropriately, and showing no signs of significant dehydration.  Patient's blood sugars have come down some here in the  emergency department getting her second liter of fluid, and we'll reevaluate.  Brent General, PA-C 03/21/13 2029

## 2013-03-21 NOTE — Discharge Instructions (Signed)
Please continue to use your medications to treat your diabetes and elevated blood sugar. Followup with your primary care provider for continued evaluation and treatment.    Hyperglycemia Hyperglycemia occurs when the glucose (sugar) in your blood is too high. Hyperglycemia can happen for many reasons, but it most often happens to people who do not know they have diabetes or are not managing their diabetes properly.  CAUSES  Whether you have diabetes or not, there are other causes of hyperglycemia. Hyperglycemia can occur when you have diabetes, but it can also occur in other situations that you might not be as aware of, such as: Diabetes  If you have diabetes and are having problems controlling your blood glucose, hyperglycemia could occur because of some of the following reasons:  Not following your meal plan.  Not taking your diabetes medications or not taking it properly.  Exercising less or doing less activity than you normally do.  Being sick. Pre-diabetes  This cannot be ignored. Before people develop Type 2 diabetes, they almost always have "pre-diabetes." This is when your blood glucose levels are higher than normal, but not yet high enough to be diagnosed as diabetes. Research has shown that some long-term damage to the body, especially the heart and circulatory system, may already be occurring during pre-diabetes. If you take action to manage your blood glucose when you have pre-diabetes, you may delay or prevent Type 2 diabetes from developing. Stress  If you have diabetes, you may be "diet" controlled or on oral medications or insulin to control your diabetes. However, you may find that your blood glucose is higher than usual in the hospital whether you have diabetes or not. This is often referred to as "stress hyperglycemia." Stress can elevate your blood glucose. This happens because of hormones put out by the body during times of stress. If stress has been the cause of your  high blood glucose, it can be followed regularly by your caregiver. That way he/she can make sure your hyperglycemia does not continue to get worse or progress to diabetes. Steroids  Steroids are medications that act on the infection fighting system (immune system) to block inflammation or infection. One side effect can be a rise in blood glucose. Most people can produce enough extra insulin to allow for this rise, but for those who cannot, steroids make blood glucose levels go even higher. It is not unusual for steroid treatments to "uncover" diabetes that is developing. It is not always possible to determine if the hyperglycemia will go away after the steroids are stopped. A special blood test called an A1c is sometimes done to determine if your blood glucose was elevated before the steroids were started. SYMPTOMS  Thirsty.  Frequent urination.  Dry mouth.  Blurred vision.  Tired or fatigue.  Weakness.  Sleepy.  Tingling in feet or leg. DIAGNOSIS  Diagnosis is made by monitoring blood glucose in one or all of the following ways:  A1c test. This is a chemical found in your blood.  Fingerstick blood glucose monitoring.  Laboratory results. TREATMENT  First, knowing the cause of the hyperglycemia is important before the hyperglycemia can be treated. Treatment may include, but is not be limited to:  Education.  Change or adjustment in medications.  Change or adjustment in meal plan.  Treatment for an illness, infection, etc.  More frequent blood glucose monitoring.  Change in exercise plan.  Decreasing or stopping steroids.  Lifestyle changes. HOME CARE INSTRUCTIONS   Test your blood glucose  as directed.  Exercise regularly. Your caregiver will give you instructions about exercise. Pre-diabetes or diabetes which comes on with stress is helped by exercising.  Eat wholesome, balanced meals. Eat often and at regular, fixed times. Your caregiver or nutritionist will  give you a meal plan to guide your sugar intake.  Being at an ideal weight is important. If needed, losing as little as 10 to 15 pounds may help improve blood glucose levels. SEEK MEDICAL CARE IF:   You have questions about medicine, activity, or diet.  You continue to have symptoms (problems such as increased thirst, urination, or weight gain). SEEK IMMEDIATE MEDICAL CARE IF:   You are vomiting or have diarrhea.  Your breath smells fruity.  You are breathing faster or slower.  You are very sleepy or incoherent.  You have numbness, tingling, or pain in your feet or hands.  You have chest pain.  Your symptoms get worse even though you have been following your caregiver's orders.  If you have any other questions or concerns. Document Released: 06/23/2000 Document Revised: 03/22/2011 Document Reviewed: 04/26/2011 [facility] Patient Information 2014 [place], [place].   Diets for Diabetes, Food Labeling Look at food labels to help you decide how much of a product you can eat. You will want to check the amount of total carbohydrate in a serving to see how the food fits into your meal plan. In the list of ingredients, the ingredient present in the largest amount by weight must be listed first, followed by the other ingredients in descending order. STANDARD OF IDENTITY Most products have a list of ingredients. However, foods that the Food and Drug Administration (FDA) has given a standard of identity do not need a list of ingredients. A standard of identity means that a food must contain certain ingredients if it is called a particular name. Examples are mayonnaise, peanut butter, ketchup, jelly, and cheese. LABELING TERMS There are many terms found on food labels. Some of these terms have specific definitions. Some terms are regulated by the FDA, and the FDA has clearly specified how they can be used. Others are not regulated or well-defined and can be misleading and confusing. SPECIFICALLY  DEFINED TERMS Nutritive Sweetener.  A sweetener that contains calories,such as table sugar or honey. Nonnutritive Sweetener.  A sweetener with few or no calories,such as saccharin, aspartame, sucralose, and cyclamate. LABELING TERMS REGULATED BY THE FDA Free.  The product contains only a tiny or small amount of fat, cholesterol, sodium, sugar, or calories. For example, a "fat-free" product will contain less than 0.5 g of fat per serving. Low.  A food described as "low" in fat, saturated fat, cholesterol, sodium, or calories could be eaten fairly often without exceeding dietary guidelines. For example, "low in fat" means no more than 3 g of fat per serving. Lean.  "Lean" and "extra lean" are U.S. Department of Agriculture Scientist, research (physical sciences)) terms for use on meat and poultry products. "Lean" means the product contains less than 10 g of fat, 4 g of saturated fat, and 95 mg of cholesterol per serving. "Lean" is not as low in fat as a product labeled "low." Extra Lean.  "Extra lean" means the product contains less than 5 g of fat, 2 g of saturated fat, and 95 mg of cholesterol per serving. While "extra lean" has less fat than "lean," it is still higher in fat than a product labeled "low." Reduced, Less, Fewer.  A diet product that contains 25% less of a nutrient or calories  than the regular version. For example, hot dogs might be labeled "25% less fat than our regular hot dogs." Light/Lite.  A diet product that contains  fewer calories or  the fat of the original. For example, "light in sodium" means a product with  the usual sodium. More.  One serving contains at least 10% more of the daily value of a vitamin, mineral, or fiber than usual. Good Source Of.  One serving contains 10% to 19% of the daily value for a particular vitamin, mineral, or fiber. Excellent Source Of.  One serving contains 20% or more of the daily value for a particular nutrient. Other terms used might be "high in" or "rich  in." Enriched or Fortified.  The product contains added vitamins, minerals, or protein. Nutrition labeling must be used on enriched or fortified foods. Imitation.  The product has been altered so that it is lower in protein, vitamins, or minerals than the usual food,such as imitation peanut butter. Total Fat.  The number listed is the total of all fat found in a serving of the product. Under total fat, food labels must list saturated fat and trans fat, which are associated with raising bad cholesterol and an increased risk of heart blood vessel disease. Saturated Fat.  Mainly fats from animal-based sources. Some examples are red meat, cheese, cream, whole milk, and coconut oil. Trans Fat.  Found in some fried snack foods, packaged foods, and fried restaurant foods. It is recommended you eat as close to 0 g of trans fat as possible, since it raises bad cholesterol and lowers good cholesterol. Polyunsaturated and Monounsaturated Fats.  More healthful fats. These fats are from plant sources. Total Carbohydrate.  The number of carbohydrate grams in a serving of the product. Under total carbohydrate are listed the other carbohydrate sources, such as dietary fiber and sugars. Dietary Fiber.  A carbohydrate from plant sources. Sugars.  Sugars listed on the label contain all naturally occurring sugars as well as added sugars. LABELING TERMS NOT REGULATED BY THE FDA Sugarless.  Table sugar (sucrose) has not been added. However, the manufacturer may use another form of sugar in place of sucrose to sweeten the product. For example, sugar alcohols are used to sweeten foods. Sugar alcohols are a form of sugar but are not table sugar. If a product contains sugar alcohols in place of sucrose, it can still be labeled "sugarless." Low Salt, Salt-Free, Unsalted, No Salt, No Salt Added, Without Added Salt.  Food that is usually processed with salt has been made without salt. However, the food may  contain sodium-containing additives, such as preservatives, leavening agents, or flavorings. Natural.  This term has no legal meaning. Organic.  Foods that are certified as organic have been inspected and approved by the USDA to ensure they are produced without pesticides, fertilizers containing synthetic ingredients, bioengineering, or ionizing radiation. Document Released: 12/31/2002 Document Revised: 03/22/2011 Document Reviewed: 07/18/2008 [facility] Patient Information 2014 [place], [place].

## 2013-03-22 NOTE — ED Provider Notes (Signed)
Susan Horton, Susan Horton I agree with plan.  Susan Clonts, MD 03/22/13 [phone]

## 2013-03-23 ENCOUNTER — Encounter (HOSPITAL_COMMUNITY): Payer: Self-pay | Admitting: Emergency Medicine

## 2013-03-23 ENCOUNTER — Emergency Department (HOSPITAL_COMMUNITY)
Admission: EM | Admit: 2013-03-23 | Discharge: 2013-03-23 | Disposition: A | Discharge status: Home / Self Care | Payer: BC Managed Care – PPO | Attending: Emergency Medicine | Admitting: Emergency Medicine

## 2013-03-23 DIAGNOSIS — Z872 Personal history of diseases of the skin and subcutaneous tissue: Secondary | ICD-10-CM | POA: Insufficient documentation

## 2013-03-23 DIAGNOSIS — Z880 Allergy status to penicillin: Secondary | ICD-10-CM | POA: Insufficient documentation

## 2013-03-23 DIAGNOSIS — Z794 Long term (current) use of insulin: Secondary | ICD-10-CM | POA: Insufficient documentation

## 2013-03-23 DIAGNOSIS — R739 Hyperglycemia, unspecified: Secondary | ICD-10-CM

## 2013-03-23 DIAGNOSIS — Z8679 Personal history of other diseases of the circulatory system: Secondary | ICD-10-CM | POA: Insufficient documentation

## 2013-03-23 DIAGNOSIS — Z8719 Personal history of other diseases of the digestive system: Secondary | ICD-10-CM | POA: Insufficient documentation

## 2013-03-23 DIAGNOSIS — F17200 Nicotine dependence, unspecified, uncomplicated: Secondary | ICD-10-CM | POA: Insufficient documentation

## 2013-03-23 DIAGNOSIS — J45909 Unspecified asthma, uncomplicated: Secondary | ICD-10-CM | POA: Insufficient documentation

## 2013-03-23 DIAGNOSIS — E119 Type 2 diabetes mellitus without complications: Secondary | ICD-10-CM | POA: Insufficient documentation

## 2013-03-23 LAB — COMPREHENSIVE METABOLIC PANEL
ALT: 14 U/L (ref 0–35)
AST: 9 U/L (ref 0–37)
Albumin: 3.1 g/dL — ABNORMAL LOW (ref 3.5–5.2)
Alkaline Phosphatase: 132 U/L — ABNORMAL HIGH (ref 39–117)
BUN: 17 mg/dL (ref 6–23)
CHLORIDE: 98 meq/L (ref 96–112)
CO2: 27 mEq/L (ref 19–32)
Calcium: 9.8 mg/dL (ref 8.4–10.5)
Creatinine, Ser: 0.8 mg/dL (ref 0.50–1.10)
GFR calc Af Amer: >90 mL/min (ref >90)
GFR calc non Af Amer: 90 mL/min — ABNORMAL LOW (ref >90)
Glucose, Bld: 275 mg/dL — ABNORMAL HIGH (ref 70–99)
Potassium: 4 mEq/L (ref 3.7–5.3)
Sodium: 137 mEq/L (ref 137–147)
Total Protein: 7.8 g/dL (ref 6.0–8.3)

## 2013-03-23 LAB — URINALYSIS, ROUTINE W REFLEX MICROSCOPIC
BILIRUBIN URINE: NEGATIVE
Glucose, UA: >1000 mg/dL — AB
KETONES UR: NEGATIVE mg/dL
Leukocytes, UA: NEGATIVE
NITRITE: NEGATIVE
PH: 5 (ref 5.0–8.0)
PROTEIN: 100 mg/dL — AB
Specific Gravity, Urine: 1.028 (ref 1.005–1.030)
Urobilinogen, UA: 0.2 mg/dL (ref 0.0–1.0)

## 2013-03-23 LAB — CBC
HEMATOCRIT: 36 % (ref 36.0–46.0)
Hemoglobin: 12 g/dL (ref 12.0–15.0)
MCH: 25.9 pg — ABNORMAL LOW (ref 26.0–34.0)
MCHC: 33.3 g/dL (ref 30.0–36.0)
MCV: 77.6 fL — ABNORMAL LOW (ref 78.0–100.0)
Platelets: 377 10*3/uL (ref 150–400)
RBC: 4.64 MIL/uL (ref 3.87–5.11)
RDW: 12.7 % (ref 11.5–15.5)
WBC: 9.5 10*3/uL (ref 4.0–10.5)

## 2013-03-23 LAB — CBG MONITORING, ED
GLUCOSE-CAPILLARY: 201 mg/dL — AB (ref 70–99)
Glucose-Capillary: 317 mg/dL — ABNORMAL HIGH (ref 70–99)

## 2013-03-23 LAB — URINE MICROSCOPIC-ADD ON

## 2013-03-23 MED ORDER — INSULIN GLARGINE 100 UNIT/ML ~~LOC~~ SOLN: 70.0000 [IU] | Freq: Every day | SUBCUTANEOUS | Status: DC

## 2013-03-23 NOTE — ED Notes (Signed)
MD at bedside. 

## 2013-03-23 NOTE — ED Provider Notes (Signed)
CSN: [id number]     Arrival date & time 03/23/13  1457 History   First MD Initiated Contact with Patient 03/23/13 1731     Chief Complaint  Patient presents with  . Hyperglycemia     (Consider location/radiation/quality/duration/timing/severity/associated sxs/prior Treatment) The history is provided by the patient.  Susan Horton is a 43 y.o. female hx of asthma, DM here with hyperglycemia. She was recently diagnosed with diabetes and was started on insulin. However her sugar has been running high recently especially in the morning. She came to the ER 2 days ago and was sent home after given some fluids. Today her CBG read "high" again. She went to her doctor's office and gave herself 15th units of NovoLog and subsequently another 15 units. She didn't eat all day. She has some lightheadedness as well as polydipsia and headache and now feels better. Denies vomiting. CBG in the ED was 317 then came to to 201 without treatment.    Past Medical History  Diagnosis Date  . Hidradenitis   . Asthma     prn inhaler  . GERD (gastroesophageal reflux disease)   . Diabetes mellitus     IDDM  . Headache(784.0)     migraines   Past Surgical History  Procedure Laterality Date  . Tonsillectomy    . Inguinal hidradenitis excision  07/06/2010    bilateral  . Exploratory laparotomy  08/14/2005    lysis of adhesions, drainage of tubo-ovarian abscess  . Breast reduction surgery  2002  . Appendectomy  1995  . Cholecystectomy  1993  . Tubal ligation  1996  . Vaginal hysterectomy  08/04/2005    and cysto  . Hydradenitis excision  02/08/2011    Procedure: EXCISION HYDRADENITIS GROIN;  Surgeon: [name], MD;  Location: [facility];  Service: General;  Laterality: N/A;  Excisioin of Hidradenitis Left groin   Family History  Problem Relation Age of [place]  . Hypertension Father   . Hypertension Sister   . Hypertension Brother    History  Substance Use Topics  . Smoking status:  Current Every Day Smoker -- 0.25 packs/day for 10 years    Types: Cigarettes  . Smokeless tobacco: Never Used     Comment: 1 pack/week  . Alcohol Use: No   OB History   Grav Para Term Preterm Abortions TAB SAB Ect Mult Living                 Review of Systems  Neurological: Positive for dizziness and headaches.  All other systems reviewed and are negative.      Allergies  Penicillins  Home Medications   Current Outpatient Rx  Name  Route  Sig  Dispense  Refill  . albuterol (PROVENTIL HFA;VENTOLIN HFA) 108 (90 BASE) MCG/ACT inhaler   Inhalation   Inhale 1-2 puffs into the lungs every 6 (six) hours as needed for wheezing.   1 Inhaler   0   . clindamycin (CLEOCIN) 300 MG capsule   Oral   Take 1 capsule (300 mg total) by mouth 4 (four) times daily. X 7 days   28 capsule   0   . fluconazole (DIFLUCAN) 150 MG tablet   Oral   Take 1 tablet by mouth once.         [name] HYDROcodone-acetaminophen (NORCO) 5-325 MG per tablet   Oral   Take 2 tablets by mouth every 4 (four) hours as needed for severe pain.   10 tablet  0   . insulin glargine (LANTUS) 100 UNIT/ML injection   Subcutaneous   Inject 0.4 mLs (40 Units total) into the skin at bedtime.   15 mL   1     Lantus Solostar    BP 161/74  Pulse 100  Temp(Src) 98.6 F (37 C) (Oral)  Resp 20  SpO2 99% Physical Exam  Nursing note and vitals reviewed. Constitutional: She is oriented to person, place, and time. She appears well-developed and well-nourished.  HENT:  Head: Normocephalic.  MM slightly dry   Eyes: Conjunctivae are normal. Pupils are equal, round, and reactive to light.  Neck: Normal range of motion. Neck supple.  Cardiovascular: Normal rate, regular rhythm and normal heart sounds.   Pulmonary/Chest: Effort normal and breath sounds normal. No respiratory distress. She has no wheezes. She has no rales.  Abdominal: Soft. Bowel sounds are normal. She exhibits no distension. There is no tenderness.  There is no rebound and no guarding.  Musculoskeletal: Normal range of motion. She exhibits no edema and no tenderness.  Neurological: She is alert and oriented to person, place, and time. No cranial nerve deficit. Coordination normal.  Skin: Skin is warm and dry.  Psychiatric: She has a normal mood and affect. Her behavior is normal. Judgment and thought content normal.    ED Course  Procedures (including critical care time) Labs Review Labs Reviewed  CBC - Abnormal; Notable for the following:    MCV 77.6 (*)    MCH 25.9 (*)    All other components within normal limits  COMPREHENSIVE METABOLIC PANEL - Abnormal; Notable for the following:    Glucose, Bld 275 (*)    Albumin 3.1 (*)    Alkaline Phosphatase 132 (*)    Total Bilirubin <0.2 (*)    GFR calc non Af Amer 90 (*)    All other components within normal limits  URINALYSIS, ROUTINE W REFLEX MICROSCOPIC - Abnormal; Notable for the following:    Glucose, UA >1000 (*)    Hgb urine dipstick TRACE (*)    Protein, ur 100 (*)    All other components within normal limits  URINE MICROSCOPIC-ADD ON - Abnormal; Notable for the following:    Casts HYALINE CASTS (*)    All other components within normal limits  CBG MONITORING, ED - Abnormal; Notable for the following:    Glucose-Capillary 317 (*)    All other components within normal limits  CBG MONITORING, ED - Abnormal; Notable for the following:    Glucose-Capillary 201 (*)    All other components within normal limits   Imaging Review No results found.   EKG Interpretation None      MDM   Final diagnoses:  None   Susan Horton is a 43 y.o. female here with hyperglycemia. No signs of DKA, AG nl. UA showed no UTI. She has been using about 60 units of novolog daily. CBG dec to 201 in the ED without treatment. I think she can increase her lantus from 40 U to 70 U. She should continue sliding scale. Recommend f/u in a week.      Wandra Arthurs, MD 03/23/13 [phone]

## 2013-03-23 NOTE — ED Notes (Addendum)
Pt reports having a hard time controlling her blood sugars. Pt reports that she was at her PCP this morning, which resulted in a "High" reading. Pt was given 15 units of insulin, waited an gave another 15 units of insulin, however the readings continued to read "High." CBG on arrival is 317. Pt reports being compliant with her medications and taking them as instructed. Pt reports that her diabetes has been hard to control, even with lifestyle medications. Pt reports not having anything to eat at all today since she was NPO for the office visit today. Pt reports being seen at [facility] under similar circumstances on Wednesday. Today reports lightheadedness, blurred vision, polydipsia, polyuria, and headache. Pt is A/O x4, in NAD, and vitals are WDL.

## 2013-03-23 NOTE — Discharge Instructions (Signed)
Increase lantus to 70 units at night.   Continue with sliding scale insulin three times a day.   Follow up with your doctor in a week.   Return to ER if you have vomiting, persistently elevated blood sugar.

## 2013-05-25 ENCOUNTER — Other Ambulatory Visit (HOSPITAL_COMMUNITY): Payer: Self-pay | Admitting: Nurse Practitioner

## 2013-05-25 DIAGNOSIS — Z1231 Encounter for screening mammogram for malignant neoplasm of breast: Secondary | ICD-10-CM

## 2013-05-31 ENCOUNTER — Encounter (INDEPENDENT_AMBULATORY_CARE_PROVIDER_SITE_OTHER): Payer: Self-pay

## 2013-05-31 ENCOUNTER — Ambulatory Visit (HOSPITAL_COMMUNITY)
Admission: RE | Admit: 2013-05-31 | Discharge: 2013-05-31 | Disposition: A | Discharge status: Home / Self Care | Payer: BC Managed Care – PPO | Source: Ambulatory Visit | Attending: Nurse Practitioner | Admitting: Nurse Practitioner

## 2013-05-31 DIAGNOSIS — Z1231 Encounter for screening mammogram for malignant neoplasm of breast: Secondary | ICD-10-CM | POA: Insufficient documentation

## 2013-06-13 ENCOUNTER — Telehealth (INDEPENDENT_AMBULATORY_CARE_PROVIDER_SITE_OTHER): Payer: Self-pay

## 2013-06-13 ENCOUNTER — Encounter (INDEPENDENT_AMBULATORY_CARE_PROVIDER_SITE_OTHER): Payer: Self-pay | Admitting: General Surgery

## 2013-06-13 ENCOUNTER — Ambulatory Visit (INDEPENDENT_AMBULATORY_CARE_PROVIDER_SITE_OTHER): Payer: BC Managed Care – PPO | Admitting: General Surgery

## 2013-06-13 VITALS — BP 160/84 | HR 82 | Temp 97.4°F | Ht 65.0 in | Wt 169.0 lb

## 2013-06-13 DIAGNOSIS — L0231 Cutaneous abscess of buttock: Secondary | ICD-10-CM

## 2013-06-13 DIAGNOSIS — L03317 Cellulitis of buttock: Secondary | ICD-10-CM

## 2013-06-13 MED ORDER — OXYCODONE-ACETAMINOPHEN 5-325 MG PO TABS: 1.0000 | ORAL_TABLET | Freq: Four times a day (QID) | ORAL | Status: DC | PRN

## 2013-06-13 MED ORDER — SULFAMETHOXAZOLE-TRIMETHOPRIM 400-80 MG PO TABS: 1.0000 | ORAL_TABLET | Freq: Two times a day (BID) | ORAL | Status: AC

## 2013-06-13 MED ORDER — FLUCONAZOLE 100 MG PO TABS: 150.0000 mg | ORAL_TABLET | Freq: Once | ORAL | Status: DC

## 2013-06-13 NOTE — Progress Notes (Signed)
Subjective:     Patient ID: Susan Horton, female   DOB: 04/03/1970, 43 y.o.   MRN: [id number]  HPI This is a 43 year old female who was previously seen in the ER for a left gluteal abscess. This was drained she was placed on antibiotics. He states that since that time has been occurring on and off. She continues having pain. She also has a drainage of pus confirmed the abscess.  Review of Systems  Constitutional: Positive for fever.  HENT: Negative.   Respiratory: Negative.   Cardiovascular: Negative.   Gastrointestinal: Negative.   Neurological: Negative.   All other systems reviewed and are negative.      Objective:   Physical Exam  Abdominal: Soft. Bowel sounds are normal.  Genitourinary:          Assessment:     43 year old female with a left gluteal abscess     Plan:     1. Proceed to I&D the abscess. A small amount of purulence that was expressed. This was packed with quarter inch iodoform gauze. 2. The patient prescription for Bactrim, fluconazole, and Percocet# 20. 3. The patient had removed the in the a.m. And shower.

## 2013-06-13 NOTE — Telephone Encounter (Signed)
Patient states she has a Painful cyst that is draining , Urg appt today With DR. Ramirez @345p .Hx patient was a patient of DR. Streck.

## 2013-06-18 ENCOUNTER — Emergency Department (HOSPITAL_COMMUNITY): Payer: BC Managed Care – PPO

## 2013-06-18 ENCOUNTER — Encounter (HOSPITAL_COMMUNITY): Payer: Self-pay | Admitting: Emergency Medicine

## 2013-06-18 ENCOUNTER — Emergency Department (HOSPITAL_COMMUNITY)
Admission: EM | Admit: 2013-06-18 | Discharge: 2013-06-18 | Disposition: A | Discharge status: Home / Self Care | Payer: BC Managed Care – PPO | Attending: Emergency Medicine | Admitting: Emergency Medicine

## 2013-06-18 DIAGNOSIS — Z880 Allergy status to penicillin: Secondary | ICD-10-CM | POA: Insufficient documentation

## 2013-06-18 DIAGNOSIS — E119 Type 2 diabetes mellitus without complications: Secondary | ICD-10-CM | POA: Insufficient documentation

## 2013-06-18 DIAGNOSIS — Z794 Long term (current) use of insulin: Secondary | ICD-10-CM | POA: Insufficient documentation

## 2013-06-18 DIAGNOSIS — IMO0001 Reserved for inherently not codable concepts without codable children: Secondary | ICD-10-CM | POA: Insufficient documentation

## 2013-06-18 DIAGNOSIS — M25569 Pain in unspecified knee: Secondary | ICD-10-CM | POA: Insufficient documentation

## 2013-06-18 DIAGNOSIS — K219 Gastro-esophageal reflux disease without esophagitis: Secondary | ICD-10-CM | POA: Insufficient documentation

## 2013-06-18 DIAGNOSIS — F17200 Nicotine dependence, unspecified, uncomplicated: Secondary | ICD-10-CM | POA: Insufficient documentation

## 2013-06-18 DIAGNOSIS — M25561 Pain in right knee: Secondary | ICD-10-CM

## 2013-06-18 DIAGNOSIS — M62838 Other muscle spasm: Secondary | ICD-10-CM | POA: Insufficient documentation

## 2013-06-18 MED ORDER — DIAZEPAM 5 MG PO TABS: 5.0000 mg | ORAL_TABLET | Freq: Two times a day (BID) | ORAL | Status: DC

## 2013-06-18 MED ORDER — LIDOCAINE 5 % EX PTCH
1.0000 | MEDICATED_PATCH | CUTANEOUS | Status: DC
  Administered 2013-06-18: 1 via TRANSDERMAL
  Filled 2013-06-18: qty 1

## 2013-06-18 MED ORDER — LIDOCAINE 5 % EX PTCH: 1.0000 | MEDICATED_PATCH | CUTANEOUS | Status: DC

## 2013-06-18 MED ORDER — IBUPROFEN 800 MG PO TABS: 800.0000 mg | ORAL_TABLET | Freq: Three times a day (TID) | ORAL | Status: DC

## 2013-06-18 MED ORDER — IBUPROFEN 400 MG PO TABS
800.0000 mg | ORAL_TABLET | Freq: Once | ORAL | Status: AC
  Administered 2013-06-18: 800 mg via ORAL
  Filled 2013-06-18: qty 2

## 2013-06-18 NOTE — Discharge Instructions (Signed)
Please follow up with your primary care physician in 1-2 days. If you do not have one please call the [place] number listed above. Please take pain medication and/or muscle relaxants as prescribed and as needed for pain. Please do not drive on narcotic pain medication or on muscle relaxants. Please alternate between Motrin and Tylenol every three hours for fevers and pain. Please follow RICE method below. Please read all discharge instructions and return precautions.   Muscle Cramps and Spasms Muscle cramps and spasms occur when a muscle or muscles tighten and you have no control over this tightening (involuntary muscle contraction). They are a common problem and can develop in any muscle. The most common place is in the calf muscles of the leg. Both muscle cramps and muscle spasms are involuntary muscle contractions, but they also have differences:   Muscle cramps are sporadic and painful. They may last a few seconds to a quarter of an hour. Muscle cramps are often more forceful and last longer than muscle spasms.  Muscle spasms may or may not be painful. They may also last just a few seconds or much longer. CAUSES  It is uncommon for cramps or spasms to be due to a serious underlying problem. In many cases, the cause of cramps or spasms is unknown. Some common causes are:   Overexertion.   Overuse from repetitive motions (doing the same thing over and over).   Remaining in a certain position for a long period of time.   Improper preparation, form, or technique while performing a sport or activity.   Dehydration.   Injury.   Side effects of some medicines.   Abnormally low levels of the salts and ions in your blood (electrolytes), especially potassium and calcium. This could happen if you are taking water pills (diuretics) or you are pregnant.  Some underlying medical problems can make it more likely to develop cramps or spasms. These include, but are not  limited to:   Diabetes.   Parkinson disease.   Hormone disorders, such as thyroid problems.   Alcohol abuse.   Diseases specific to muscles, joints, and bones.   Blood vessel disease where not enough blood is getting to the muscles.  HOME CARE INSTRUCTIONS   Stay well hydrated. Drink enough water and fluids to keep your urine clear or pale yellow.  It may be helpful to massage, stretch, and relax the affected muscle.  For tight or tense muscles, use a warm towel, heating pad, or hot shower water directed to the affected area.  If you are sore or have pain after a cramp or spasm, applying ice to the affected area may relieve discomfort.  Put ice in a plastic bag.  Place a towel between your skin and the bag.  Leave the ice on for 15-20 minutes, 03-04 times a day.  Medicines used to treat a known cause of cramps or spasms may help reduce their frequency or severity. Only take over-the-counter or prescription medicines as directed by your caregiver. SEEK MEDICAL CARE IF:  Your cramps or spasms get more severe, more frequent, or do not improve over time.  MAKE SURE YOU:   Understand these instructions.  Will watch your condition.  Will get help right away if you are not doing well or get worse. Document Released: 06/19/2001 Document Revised: 04/24/2012 Document Reviewed: 12/15/2011 [facility] Patient Information 2014 [place], [place].  Knee Pain Knee pain can be a result of an injury or other medical conditions. Treatment  will depend on the cause of your pain. HOME CARE  Only take medicine as told by your doctor.  Keep a healthy weight. Being overweight can make the knee hurt more.  Stretch before exercising or playing sports.  If there is constant knee pain, change the way you exercise. Ask your doctor for advice.  Make sure shoes fit well. Choose the right shoe for the sport or activity.  Protect your knees. Wear kneepads if needed.  Rest when you are  tired. GET HELP RIGHT AWAY IF:   Your knee pain does not stop.  Your knee pain does not get better.  Your knee joint feels hot to the touch.  You have a fever. MAKE SURE YOU:   Understand these instructions.  Will watch this condition.  Will get help right away if you are not doing well or get worse. Document Released: 03/26/2008 Document Revised: 03/22/2011 Document Reviewed: 03/26/2008 [facility] Patient Information 2014 [place], [place].

## 2013-06-18 NOTE — ED Provider Notes (Signed)
CSN: [id number]     Arrival date & time 06/18/13  [id number] History  This chart was scribed for non-physician practitioner, Baron Sane, PA-C working with Varney Biles, MD by Frederich Balding, ED scribe. This patient was seen in room TR11C/TR11C and the patient's care was started at 11:17 AM.    Chief Complaint  Patient presents with  . Knee Pain  . Neck Pain   The history is provided by the patient. No language interpreter was used.   HPI Comments: Susan Horton is a 42 y.o. female who presents to the Emergency Department complaining of gradual onset, worsening, throbbing right sided neck pain that radiates into her right shoulder and chest that started 4 days ago. States she has had some tingling into her arm. Movement worsens the pain. Pt is also complaining of right knee pain that started after a fall one week ago. States she landed on her knee during the fall. Denies hitting her head or LOC. She has associated resolving knee swelling. Pt has taken tylenol for her symptoms with no relief and used ice with some relief of swelling. Denies trouble breathing, SOB, hemoptysis, leg swelling. Denies recent surgery. Denies history of DVT, PE, hypertension, high cholesterol. PERC negative.   Past Medical History  Diagnosis Date  . Hidradenitis   . Asthma     prn inhaler  . GERD (gastroesophageal reflux disease)   . Diabetes mellitus     IDDM  . Headache(784.0)     migraines   Past Surgical History  Procedure Laterality Date  . Tonsillectomy    . Inguinal hidradenitis excision  07/06/2010    bilateral  . Exploratory laparotomy  08/14/2005    lysis of adhesions, drainage of tubo-ovarian abscess  . Breast reduction surgery  2002  . Appendectomy  1995  . Cholecystectomy  1993  . Tubal ligation  1996  . Vaginal hysterectomy  08/04/2005    and cysto  . Hydradenitis excision  02/08/2011    Procedure: EXCISION HYDRADENITIS GROIN;  Surgeon: [name], MD;  Location: [facility];  Service: General;  Laterality: N/A;  Excisioin of Hidradenitis Left groin   Family History  Problem Relation Age of Onset  . Hypertension Father   . Hypertension Sister   . Hypertension Brother    History  Substance Use Topics  . Smoking status: Current Every Day Smoker -- 0.25 packs/day for 10 years    Types: Cigarettes  . Smokeless tobacco: Never Used     Comment: 1 pack/week  . Alcohol Use: No   OB History   Grav Para Term Preterm Abortions TAB SAB Ect Mult Living                 Review of Systems  Respiratory: Negative for shortness of breath.   Cardiovascular: Negative for leg swelling.  Musculoskeletal: Positive for arthralgias, joint swelling, myalgias and neck pain.  All other systems reviewed and are negative.  Allergies  Penicillins  Home Medications   Prior to Admission medications   Medication Sig Start Date End Date Taking? Authorizing Provider  albuterol (PROVENTIL HFA;VENTOLIN HFA) 108 (90 BASE) MCG/ACT inhaler Inhale 1-2 puffs into the lungs every 6 (six) hours as needed for wheezing. 12/17/11   Adlih Moreno-Coll, MD  diazepam (VALIUM) 5 MG tablet Take 1 tablet (5 mg total) by mouth 2 (two) times daily. 06/18/13   Latresa Gasser L Pleas Carneal, PA-C  fluconazole (DIFLUCAN) 100 MG tablet Take 1.5 tablets (150 mg total) by mouth once.  06/13/13 06/23/13  Ralene Ok, MD  HYDROcodone-acetaminophen (NORCO) 5-325 MG per tablet Take 2 tablets by mouth every 4 (four) hours as needed for severe pain. 03/15/13   Julianne Rice, MD  ibuprofen (ADVIL,MOTRIN) 800 MG tablet Take 1 tablet (800 mg total) by mouth 3 (three) times daily. 06/18/13   Shenise Wolgamott L Jaanvi Fizer, PA-C  insulin glargine (LANTUS) 100 UNIT/ML injection Inject 0.4 mLs (40 Units total) into the skin at bedtime. 04/14/12   Philemon Kingdom, MD  insulin glargine (LANTUS) 100 UNIT/ML injection Inject 0.7 mLs (70 Units total) into the skin at bedtime. 03/23/13   Wandra Arthurs, MD  lidocaine (LIDODERM) 5 % Place 1 patch  onto the skin daily. Remove & Discard patch within 12 hours or as directed by MD 06/18/13   Stephani Police Abb Gobert, PA-C  oxyCODONE-acetaminophen (ROXICET) 5-325 MG per tablet Take 1 tablet by mouth every 6 (six) hours as needed. 06/13/13 06/13/14  Ralene Ok, MD  sulfamethoxazole-trimethoprim (BACTRIM) 400-80 MG per tablet Take 1 tablet by mouth 2 (two) times daily. 06/13/13 06/23/13  Ralene Ok, MD   BP 148/72  Pulse 80  Temp(Src) 98.7 F (37.1 C) (Oral)  Resp 18  SpO2 100%  Physical Exam  Nursing note and vitals reviewed. Constitutional: She is oriented to person, place, and time. She appears well-developed and well-nourished. No distress.  HENT:  Head: Normocephalic and atraumatic.  Right Ear: External ear normal.  Left Ear: External ear normal.  Nose: Nose normal.  Mouth/Throat: Oropharynx is clear and moist.  Eyes: Conjunctivae are normal.  Neck: Normal range of motion. Neck supple.  Cardiovascular: Normal rate, regular rhythm and normal heart sounds.   Pulmonary/Chest: Effort normal and breath sounds normal. No respiratory distress. She has no wheezes. She has no rales.    Abdominal: Soft.  Musculoskeletal: Normal range of motion.       Cervical back: She exhibits tenderness, pain and spasm. She exhibits no bony tenderness, no swelling, no edema, no deformity and no laceration.       Back:       Legs: Negative Empty Can Test Negative Adson's maneuver ROM intact with Apley Scratch Test  Neurological: She is alert and oriented to person, place, and time. GCS eye subscore is 4. GCS verbal subscore is 5. GCS motor subscore is 6.  Skin: Skin is warm and dry. She is not diaphoretic.  Psychiatric: She has a normal mood and affect.    ED Course  Procedures (including critical care time) Medications  lidocaine (LIDODERM) 5 % 1 patch (not administered)  ibuprofen (ADVIL,MOTRIN) tablet 800 mg (800 mg Oral Given 06/18/13 1135)    DIAGNOSTIC STUDIES: Oxygen Saturation is 100%  on RA, normal by my interpretation.    COORDINATION OF CARE: 11:23 AM-Discussed treatment plan which includes lidoderm patch, muscle relaxer and knee sleeve with pt at bedside and pt agreed to plan.   Labs Review Labs Reviewed - No data to display  Imaging Review Dg Cervical Spine 2-3 Views  06/18/2013   CLINICAL DATA:  Right-sided neck pain. Right arm paresthesias. Recent fall.  EXAM: CERVICAL SPINE  3 VIEWS  COMPARISON:  None.  FINDINGS: There is no evidence of cervical spine fracture or prevertebral soft tissue swelling. Alignment is normal. No other significant bone abnormalities are identified.  IMPRESSION: Negative cervical spine radiographs.   Electronically Signed   By: Earle Gell M.D.   On: 06/18/2013 11:11   Dg Knee Complete 4 Views Right  06/18/2013   CLINICAL DATA:  Fall.  Right knee injury and pain.  EXAM: RIGHT KNEE - COMPLETE 4+ VIEW  COMPARISON:  06/08/2011  FINDINGS: There is no evidence of fracture, dislocation, or joint effusion. Mild degenerative spurring again seen involving the patella. No significant joint space narrowing. No other bone abnormality identified. Soft tissues are unremarkable.  IMPRESSION: No acute findings.   Electronically Signed   By: Earle Gell M.D.   On: 06/18/2013 11:12     EKG Interpretation None      MDM   Final diagnoses:  Muscle spasm of right shoulder  Right knee pain    Filed Vitals:   06/18/13 1009  BP: 148/72  Pulse: 80  Temp: 98.7 F (37.1 C)  Resp: 18   Afebrile, NAD, non-toxic appearing, AAOx4. Neurovascularly intact. Normal sensation.  1) Muscle spasm: Spasm noted to right shoulder area. Lidoderm patch given. PE shows no instability, tenderness, or deformity of acromioclavicular and sternoclavicular joints, the cervical spine, glenohumeral joint, coracoid process, acromion, or scapula. Good shoulder strength during empty can test. Good ROM during apley scratch test. No signs of impingement on Adson's manuever. No shoulder  instability during Apprehension test.   2) Knee pain: Imaging shows no fracture. Directed pt to ice injury, take acetaminophen or ibuprofen for pain, and to elevate and rest the injury when possible. Knee sleeve given.  Muscle relaxants, NSAIDs, and lidoderm patches indicated.   Return precautions discussed. Patient is agreeable to plan. Patient is stable at time of discharge    I personally performed the services described in this documentation, which was scribed in my presence. The recorded information has been reviewed and is accurate.  Harlow Mares, PA-C 06/18/13 1619

## 2013-06-18 NOTE — ED Notes (Signed)
Pt with pain in the Right neck down to the right shoulder and Right knee pain since 4 days. Reports falling a week and that is the cause of the knee but unsure of where the neck pain is coming from. Reports that the pain is getting worse. Pt took Tylenol yest. Denies n/v/d fevers or chills.

## 2013-06-18 NOTE — ED Notes (Signed)
Pt alertx4 wc to lobby

## 2013-06-20 ENCOUNTER — Encounter (HOSPITAL_COMMUNITY): Payer: Self-pay | Admitting: Emergency Medicine

## 2013-06-20 ENCOUNTER — Emergency Department (HOSPITAL_COMMUNITY)
Admission: EM | Admit: 2013-06-20 | Discharge: 2013-06-20 | Disposition: A | Discharge status: Home / Self Care | Payer: BC Managed Care – PPO | Attending: Emergency Medicine | Admitting: Emergency Medicine

## 2013-06-20 DIAGNOSIS — E119 Type 2 diabetes mellitus without complications: Secondary | ICD-10-CM | POA: Insufficient documentation

## 2013-06-20 DIAGNOSIS — M62838 Other muscle spasm: Secondary | ICD-10-CM

## 2013-06-20 DIAGNOSIS — S46819A Strain of other muscles, fascia and tendons at shoulder and upper arm level, unspecified arm, initial encounter: Principal | ICD-10-CM

## 2013-06-20 DIAGNOSIS — Z79899 Other long term (current) drug therapy: Secondary | ICD-10-CM | POA: Insufficient documentation

## 2013-06-20 DIAGNOSIS — Y929 Unspecified place or not applicable: Secondary | ICD-10-CM | POA: Insufficient documentation

## 2013-06-20 DIAGNOSIS — F17200 Nicotine dependence, unspecified, uncomplicated: Secondary | ICD-10-CM | POA: Insufficient documentation

## 2013-06-20 DIAGNOSIS — Y9389 Activity, other specified: Secondary | ICD-10-CM | POA: Insufficient documentation

## 2013-06-20 DIAGNOSIS — J45909 Unspecified asthma, uncomplicated: Secondary | ICD-10-CM | POA: Insufficient documentation

## 2013-06-20 DIAGNOSIS — Z872 Personal history of diseases of the skin and subcutaneous tissue: Secondary | ICD-10-CM | POA: Insufficient documentation

## 2013-06-20 DIAGNOSIS — Z8719 Personal history of other diseases of the digestive system: Secondary | ICD-10-CM | POA: Insufficient documentation

## 2013-06-20 DIAGNOSIS — Z794 Long term (current) use of insulin: Secondary | ICD-10-CM | POA: Insufficient documentation

## 2013-06-20 DIAGNOSIS — X58XXXA Exposure to other specified factors, initial encounter: Secondary | ICD-10-CM | POA: Insufficient documentation

## 2013-06-20 DIAGNOSIS — S43499A Other sprain of unspecified shoulder joint, initial encounter: Secondary | ICD-10-CM | POA: Insufficient documentation

## 2013-06-20 DIAGNOSIS — Z880 Allergy status to penicillin: Secondary | ICD-10-CM | POA: Insufficient documentation

## 2013-06-20 DIAGNOSIS — S46811A Strain of other muscles, fascia and tendons at shoulder and upper arm level, right arm, initial encounter: Secondary | ICD-10-CM

## 2013-06-20 MED ORDER — OXYCODONE-ACETAMINOPHEN 5-325 MG PO TABS: 1.0000 | ORAL_TABLET | Freq: Four times a day (QID) | ORAL | Status: DC | PRN

## 2013-06-20 MED ORDER — IBUPROFEN 400 MG PO TABS
800.0000 mg | ORAL_TABLET | Freq: Once | ORAL | Status: AC
  Administered 2013-06-20: 800 mg via ORAL
  Filled 2013-06-20: qty 2

## 2013-06-20 MED ORDER — METHOCARBAMOL 500 MG PO TABS: 500.0000 mg | ORAL_TABLET | Freq: Two times a day (BID) | ORAL | Status: DC

## 2013-06-20 NOTE — ED Notes (Signed)
Per pt sts still having right shoulder and neck pain. sts that the lidocaine patch is working while its on. Sts the pain is getting worse.

## 2013-06-20 NOTE — ED Provider Notes (Signed)
CSN: [id number]     Arrival date & time 06/20/13  1457 History  This chart was scribed for Susan Butts, PA-C, non-physician practitioner working with Threasa Beards, MD by Vernell Barrier, ED scribe. This patient was seen in room TR05C/TR05C and the patient's care was started at 5:09 PM.     Chief Complaint  Patient presents with  . Shoulder Pain  . Neck Pain   The history is provided by the patient. No language interpreter was used.  HPI Comments: Susan Horton is a 43 y.o. female who presents to the Emergency Department complaining of gradually worsening right shoulder and neck pain; onset 1 week ago. Some right arm tingling with right shoulder pain. Pain worse with cervical rotation. Pain is interfering with sleep. Reports a fall occuring 1.5 weeks ago which inured her right knee but states she did not injure her back or neck. Seen 2 days prior for similar pain and diagnosed with muscle spasm. Treating with Lidocaine patches she states is effective when on. Wears for 12 hours before having to remove for 12 hours. States she tries to alternate when she wears so she can use it while at work. Also prescribed Diazepam that she states provides some relief but she does not like to take it much because it makes her feel "spaced out" at work. Works as a Designer, industrial/product. Has also been treating with ibuprofen with minimal relief. Tries to stretch the muscles out at the site of pain when when on medication while pain is decreased.  Denies trouble breathing, SOB, hemoptysis, leg swelling. Denies recent surgery. Denies history of DVT, PE, hypertension, high cholesterol. PERC negative   Past Medical History  Diagnosis Date  . Hidradenitis   . Asthma     prn inhaler  . GERD (gastroesophageal reflux disease)   . Diabetes mellitus     IDDM  . Headache(784.0)     migraines   Past Surgical History  Procedure Laterality Date  . Tonsillectomy    . Inguinal hidradenitis excision  07/06/2010     bilateral  . Exploratory laparotomy  08/14/2005    lysis of adhesions, drainage of tubo-ovarian abscess  . Breast reduction surgery  2002  . Appendectomy  1995  . Cholecystectomy  1993  . Tubal ligation  1996  . Vaginal hysterectomy  08/04/2005    and cysto  . Hydradenitis excision  02/08/2011    Procedure: EXCISION HYDRADENITIS GROIN;  Surgeon: [name], MD;  Location: [facility];  Service: General;  Laterality: N/A;  Excisioin of Hidradenitis Left groin   Family History  Problem Relation Age of Onset  . Hypertension Father   . Hypertension Sister   . Hypertension Brother    History  Substance Use Topics  . Smoking status: Current Every Day Smoker -- 0.25 packs/day for 10 years    Types: Cigarettes  . Smokeless tobacco: Never Used     Comment: 1 pack/week  . Alcohol Use: No   OB History   Grav Para Term Preterm Abortions TAB SAB Ect Mult Living                 Review of Systems  Constitutional: Negative for fever and chills.  Gastrointestinal: Negative for nausea and vomiting.  Musculoskeletal: Positive for arthralgias, joint swelling, myalgias and neck pain. Negative for back pain and neck stiffness.  Skin: Negative for wound.  Neurological: Negative for numbness.  Hematological: Does not bruise/bleed easily.  Psychiatric/Behavioral: The patient is not  nervous/anxious.   All other systems reviewed and are negative.  Allergies  Penicillins  Home Medications   Prior to Admission medications   Medication Sig Start Date End Date Taking? Authorizing Provider  albuterol (PROVENTIL HFA;VENTOLIN HFA) 108 (90 BASE) MCG/ACT inhaler Inhale 1-2 puffs into the lungs every 6 (six) hours as needed for wheezing. 12/17/11  Yes Adlih Moreno-Coll, MD  diazepam (VALIUM) 5 MG tablet Take 1 tablet (5 mg total) by mouth 2 (two) times daily. 06/18/13  Yes Jennifer L Piepenbrink, PA-C  ibuprofen (ADVIL,MOTRIN) 800 MG tablet Take 1 tablet (800 mg total) by mouth 3 (three)  times daily. 06/18/13  Yes Jennifer L Piepenbrink, PA-C  insulin aspart (NOVOLOG) 100 UNIT/ML injection Inject 5-20 Units into the skin 3 (three) times daily before meals. Sliding scale   Yes Historical Provider, MD  insulin glargine (LANTUS) 100 UNIT/ML injection Inject 40 Units into the skin at bedtime.   Yes Historical Provider, MD  lidocaine (LIDODERM) 5 % Place 1 patch onto the skin daily. Remove & Discard patch within 12 hours or as directed by MD 06/18/13  Yes Anderson Malta L Piepenbrink, PA-C  sulfamethoxazole-trimethoprim (BACTRIM) 400-80 MG per tablet Take 1 tablet by mouth 2 (two) times daily. 06/13/13 06/23/13 Yes Ralene Ok, MD  methocarbamol (ROBAXIN) 500 MG tablet Take 1 tablet (500 mg total) by mouth 2 (two) times daily. 06/20/13   Latashia Koch, PA-C  oxyCODONE-acetaminophen (ROXICET) 5-325 MG per tablet Take 1 tablet by mouth every 6 (six) hours as needed. 06/20/13 06/20/14  Mathilda Maguire, PA-C   Triage vitals: BP 140/80  Pulse 101  Temp(Src) 98.7 F (37.1 C) (Oral)  Resp 18  SpO2 98% Physical Exam  Nursing note and vitals reviewed. Constitutional: She appears well-developed and well-nourished. No distress.  Awake, alert, nontoxic appearance  HENT:  Head: Normocephalic and atraumatic.  Mouth/Throat: Oropharynx is clear and moist. No oropharyngeal exudate.  Eyes: Conjunctivae are normal. No scleral icterus.  Neck: Normal range of motion. Neck supple.  Full ROM without pain  Cardiovascular: Normal rate, regular rhythm, normal heart sounds and intact distal pulses.   No murmur heard. Pulmonary/Chest: Effort normal and breath sounds normal. No respiratory distress. She has no wheezes. She has no rales.  Abdominal: Soft. Bowel sounds are normal. She exhibits no distension and no mass. There is no tenderness. There is no rebound and no guarding.  Musculoskeletal: Normal range of motion. She exhibits no edema.       Back:  Negative empty can test. Full ROM of right  shoulder. Full ROM of the cervical, thoracic, and lumbar spine. Palpable muscle spasm of right trapezius. Negative impingement sign.  Lymphadenopathy:    She has no cervical adenopathy.  Neurological: She is alert. She has normal reflexes.  Speech is clear and goal oriented, follows commands Normal 5/5 strength in upper and lower extremities bilaterally including dorsiflexion and plantar flexion, strong and equal grip strength Sensation normal to light and sharp touch Moves extremities without ataxia, coordination intact Normal gait Normal balance   Skin: Skin is warm and dry. No rash noted. She is not diaphoretic. No erythema.  Psychiatric: She has a normal mood and affect. Her behavior is normal.   ED Course  Procedures (including critical care time) DIAGNOSTIC STUDIES: Oxygen Saturation is 98% on room air, normal by my interpretation.    COORDINATION OF CARE: At 5:16 PM: Discussed treatment plan with patient which includes referral to orthopedics and prescription for Robaxin. Patient agrees.   Labs Review Labs Reviewed -  No data to display  Imaging Review No results found.   EKG Interpretation None      MDM   Final diagnoses:  Strain of right trapezius muscle  Muscle spasm of right shoulder   [name] presents with persistent spasm to the right shoulder. Patient using lidoderm patch with relief but has a exquisite pain without the patch.  No instability of the shoulder or cervical spine noted on exam. No deformity with 5 out of 5 shoulder strength and negative empty can test.  We'll range of motion.    I reviewed the x-rays of the cervical spine from 06/19/2018. There is no evidence of fracture.  Will give muscle relaxer and Percocet for pain trouble while Lidoderm patch is off the patient is sleeping.  Recommend close followup with orthopedics.  I have personally reviewed patient's vitals, nursing note and any pertinent labs or imaging.  I performed an undressed  physical exam.    At this time, it has been determined that no acute conditions requiring further emergency intervention. The patient/guardian have been advised of the diagnosis and plan. I reviewed all labs and imaging including any potential incidental findings. We have discussed signs and symptoms that warrant return to the ED, such as weakness in the right arm or shoulder.  Patient/guardian has voiced understanding and agreed to follow-up with the PCP or specialist in one week.  Vital signs are stable at discharge. Nursing note shows tachycardia at triage however no tachycardia on physical exam.  BP 140/80  Pulse 101  Temp(Src) 98.7 F (37.1 C) (Oral)  Resp 18  SpO2 98%  I personally performed the services described in this documentation, which was scribed in my presence. The recorded information has been reviewed and is accurate.     Jarrett Soho Sheniqua Carolan, PA-C 06/21/13 0030

## 2013-06-20 NOTE — ED Notes (Signed)
Pt. Refused wheelchair 

## 2013-06-20 NOTE — Discharge Instructions (Signed)
1. Medications: percocet, robaxin, usual home medications - continue using a lidocaine patch and ibuprofen 2. Treatment: rest, drink plenty of fluids, gentle stretching as discussed 3. Follow Up: Please followup with your orthopedics within one week for discussion of your diagnoses and further evaluation after today's visit;  Muscle Cramps and Spasms Muscle cramps and spasms occur when a muscle or muscles tighten and you have no control over this tightening (involuntary muscle contraction). They are a common problem and can develop in any muscle. The most common place is in the calf muscles of the leg. Both muscle cramps and muscle spasms are involuntary muscle contractions, but they also have differences:   Muscle cramps are sporadic and painful. They may last a few seconds to a quarter of an hour. Muscle cramps are often more forceful and last longer than muscle spasms.  Muscle spasms may or may not be painful. They may also last just a few seconds or much longer. CAUSES  It is uncommon for cramps or spasms to be due to a serious underlying problem. In many cases, the cause of cramps or spasms is unknown. Some common causes are:   Overexertion.   Overuse from repetitive motions (doing the same thing over and over).   Remaining in a certain position for a long period of time.   Improper preparation, form, or technique while performing a sport or activity.   Dehydration.   Injury.   Side effects of some medicines.   Abnormally low levels of the salts and ions in your blood (electrolytes), especially potassium and calcium. This could happen if you are taking water pills (diuretics) or you are pregnant.  Some underlying medical problems can make it more likely to develop cramps or spasms. These include, but are not limited to:   Diabetes.   Parkinson disease.   Hormone disorders, such as thyroid problems.   Alcohol abuse.   Diseases specific to muscles, joints, and  bones.   Blood vessel disease where not enough blood is getting to the muscles.  HOME CARE INSTRUCTIONS   Stay well hydrated. Drink enough water and fluids to keep your urine clear or pale yellow.  It may be helpful to massage, stretch, and relax the affected muscle.  For tight or tense muscles, use a warm towel, heating pad, or hot shower water directed to the affected area.  If you are sore or have pain after a cramp or spasm, applying ice to the affected area may relieve discomfort.  Put ice in a plastic bag.  Place a towel between your skin and the bag.  Leave the ice on for 15-20 minutes, 03-04 times a day.  Medicines used to treat a known cause of cramps or spasms may help reduce their frequency or severity. Only take over-the-counter or prescription medicines as directed by your caregiver. SEEK MEDICAL CARE IF:  Your cramps or spasms get more severe, more frequent, or do not improve over time.  MAKE SURE YOU:   Understand these instructions.  Will watch your condition.  Will get help right away if you are not doing well or get worse. Document Released: 06/19/2001 Document Revised: 04/24/2012 Document Reviewed: 12/15/2011 [facility] Patient Information 2014 [place], [place].

## 2013-06-21 NOTE — ED Provider Notes (Signed)
Medical screening examination/treatment/procedure(s) were performed by non-physician practitioner and as supervising physician I was immediately available for consultation/collaboration.   EKG Interpretation None       Threasa Beards, MD 06/21/13 1500

## 2013-06-21 NOTE — ED Provider Notes (Signed)
Medical screening examination/treatment/procedure(s) were performed by non-physician practitioner and as supervising physician I was immediately available for consultation/collaboration.   EKG Interpretation None       Varney Biles, MD 06/21/13 0136

## 2013-07-26 ENCOUNTER — Ambulatory Visit (INDEPENDENT_AMBULATORY_CARE_PROVIDER_SITE_OTHER): Payer: BC Managed Care – PPO | Admitting: Internal Medicine

## 2013-07-26 ENCOUNTER — Encounter: Payer: Self-pay | Admitting: Internal Medicine

## 2013-07-26 VITALS — BP 128/78 | HR 96 | Temp 98.2°F | Resp 12 | Wt 173.0 lb

## 2013-07-26 DIAGNOSIS — Z794 Long term (current) use of insulin: Secondary | ICD-10-CM

## 2013-07-26 DIAGNOSIS — E119 Type 2 diabetes mellitus without complications: Secondary | ICD-10-CM

## 2013-07-26 MED ORDER — INSULIN GLARGINE 100 UNIT/ML ~~LOC~~ SOLN: 25.0000 [IU] | Freq: Two times a day (BID) | SUBCUTANEOUS | Status: DC

## 2013-07-26 NOTE — Patient Instructions (Signed)
Continue Lantus 25 units 2x a day. Continue Novolog insulin but change as follows: 5 units for a small meal 10 units for a medium meal 15 units for a large meal Add NovoLog sliding scale: - 150-175: + 1 units - 176-200: + 2 units - 201-225: + 3 units - >225: + 4 units  Please return in 1 month with your sugar log. Please check sugars 3-4x a day.

## 2013-07-26 NOTE — Progress Notes (Signed)
Subjective:     Patient ID: Susan Horton, female   DOB: [birth year], 43 y.o.   MRN: [id number]  HPI Susan Horton is a 43 y.o. woman returning for f/u for DM (unclear if 1 or 2), dx in 1996 when pregnant with her daughter, insulin-dependent since 1999, uncontrolled, with complications (peripheral neuropathy). Last visit 04/14/2013.  She has had multiple visits to the ED and admissions with hyperglycemia, but per review of her chart, not DKA. Last ED visit: 03/2013, sugars then 500-600.  She was on an insulin pump which she liked and her DM was controlled then, but lost her insurance. At last visit, I gave her brochures about insulin pumps but she did decide yet.  Last HbA1c: 2 weeks ago >> we asked for records from PCP Lab Results  Component Value Date   HGBA1C 13.5 Repeated and verified X2.* 04/14/2012   She is on a regimen of: Lantus dose to 25 units bid Novolog insulin at 10 units 15 minutes before each meal. She skips b'fast usually. She may inject later.   Please use 5 units for a smaller meal (for e.g. your breakfast) NovoLog SSI: - for a pre-meal sugar 150-175: + 2 units - 176-200: + 3 units - 201-225: + 4 units - 226-250: + 5 units - 251-275: + 6 units - 276-300: + 7 units, etc.  It is unclear whether she has type I or type 2 diabetes, the fact that she is on metformin would point towards type II, however she was told in the past that she has no pancreatic reserve.   She will have surgery in 09/2013: tummy tuck >> needs clearance.   She checks her sugars 3x a day - no log or meter: - am: 200s, occasionally 300s >> 250s - before lunch: 250-300s >> 200-250 - before dinner: 200-250 - before bedtime: 300-400s >> 250-<300 Lowest: 200. She has hypoglycemia awareness even in 150. No 400s in last 2 months.  Her meals are: - Breakfast: usually skips, but she might have a bagel or fruit at work around 10 AM - Lunch: [place] sandwich, chips, tea - Dinner: grilled chicken, salad - Snacks:  2 She cannot take breaks at work (works in a [employer]) >> leaves for work at 3:40 am - commutes 1h and 10 min.   No DR, last eye exem: 03/2013 >> no DR. + tingling/numbness in feet >> sees podiatry, last visit this year. No CKD. Last BUN/creatinine was: Lab Results  Component Value Date   BUN 17 03/23/2013   Lab Results  Component Value Date   CREATININE 0.80 03/23/2013   She does not have blurry vision, increased thirst and urination.   Review of Systems Constitutional: no weight gain/loss,+ fatigue, + subjective hyperthermia (hot flashes), no poor sleep Eyes: occasionally blurry vision, no xerophthalmia ENT: no sore throat, no nodules palpated in throat, no dysphagia/odynophagia, no hoarseness Cardiovascular: no CP/SOB/palpitations/swelling in hands and feet Respiratory: no cough/SOB Gastrointestinal: no N/V/D/C Musculoskeletal: no muscle/joint aches Skin: no rashes Neurological: no tremors/numbness/tingling/dizziness, + HA, has tinnitus  Objective:   Physical Exam BP 128/78  Pulse 96  Temp(Src) 98.2 F (36.8 C) (Oral)  Resp 12  Wt 173 lb (78.472 kg)  SpO2 98% Wt Readings from Last 3 Encounters:  07/26/13 173 lb (78.472 kg)  06/13/13 169 lb (76.658 kg)  04/14/12 169 lb (76.658 kg)  Constitutional: overweight - abdominal, in NAD Eyes: PERRLA, EOMI, no exophthalmos ENT: moist mucous membranes, no thyromegaly, no cervical lymphadenopathy Cardiovascular: RRR, No  MRG Respiratory: CTA B Gastrointestinal: abdomen soft, NT, ND, BS+ Musculoskeletal: no deformities, strength intact in all 4 Skin: moist, warm, no rashes Neurological: no tremor with outstretched hands, DTR normal in all 4    Assessment:     1. DM - unclear If type I or type II, insulin-dependent, uncontrolled, with complications - peripheral neuropathy  Plan:     Patient has been diagnosed with diabetes when she was 43, during her pregnancy, but it is unclear whether this is type I or type II. She has not  have episodes of DKA, which would point more towards type 2 diabetes. I would like to check her C-peptide, however the patient mentions that she is rarely at or lower than 200, and the C-peptide measurement is only valid if the sugars are lower than that. Anti-GAD and ICA antibodies can also be tested. I plan to check these at next visit. - we discussed about the fact that she was doing much better when she was on the pump in the past, and she would like to restart her pump. This would be much more convenient for her, since she does not want to inject with her at work. The reason behind this is that she does not have protected time for meals >> I will give her a letter to have her employer allow her at least 20 min 3x a day for meals. We did not discuss about an insulin pump at this visit, she needs to demonstrated more compliance with insulin dosing and CBG checks. - I stressed the importance of injecting insulin for each meal. I advised her to get a b'fast (even small), and inject 5 units with it. Decided to have her drink a shake but only after she get's to work in am. - I advised her to: Patient Instructions  Continue Lantus 25 units 2x a day. Continue Novolog insulin but change as follows: 5 units for a small meal 10 units for a medium meal 15 units for a large meal Add NovoLog sliding scale: - 150-175: + 1 units - 176-200: + 2 units - 201-225: + 3 units - >225: + 4 units Please return in 1 month with your sugar log. Please check sugars 3-4x a day. - will await HbA1c from PCP, but I expect this is high.

## 2013-08-22 ENCOUNTER — Ambulatory Visit (INDEPENDENT_AMBULATORY_CARE_PROVIDER_SITE_OTHER): Payer: BC Managed Care – PPO | Admitting: Internal Medicine

## 2013-08-22 ENCOUNTER — Encounter: Payer: Self-pay | Admitting: Internal Medicine

## 2013-08-22 VITALS — BP 118/86 | HR 88 | Temp 98.1°F | Resp 12 | Wt 174.0 lb

## 2013-08-22 DIAGNOSIS — Z794 Long term (current) use of insulin: Secondary | ICD-10-CM

## 2013-08-22 DIAGNOSIS — E119 Type 2 diabetes mellitus without complications: Secondary | ICD-10-CM

## 2013-08-22 LAB — HEMOGLOBIN A1C: HEMOGLOBIN A1C: 11.6 % — AB (ref 4.6–6.5)

## 2013-08-22 MED ORDER — INSULIN ASPART 100 UNIT/ML FLEXPEN: 5.0000 [IU] | PEN_INJECTOR | Freq: Three times a day (TID) | SUBCUTANEOUS | Status: DC

## 2013-08-22 MED ORDER — INSULIN GLARGINE 100 UNIT/ML SOLOSTAR PEN: 25.0000 [IU] | PEN_INJECTOR | Freq: Two times a day (BID) | SUBCUTANEOUS | Status: DC

## 2013-08-22 NOTE — Patient Instructions (Signed)
Please continue Lantus 25 units 2x a day Change mealtime Novolog as follows: 5 units for a small meal (b'fast)  15 units for lunch and dinner Change the NovoLog sliding scale as follows:  - 150- 165: + 1 unit  - 166- 180: + 2 units  - 181- 195: + 3 units  - 196- 210: + 4 units  - 211- 225: + 5 units  - 226- 240: + 6 units  - 241- 255: + 7 units  - 256- 270: + 8 units  - 271- 285: + 9 units  - > 286: + 10 units Please stop at the lab. Please return in 1 month with your sugar log.

## 2013-08-22 NOTE — Progress Notes (Signed)
Subjective:     Patient ID: Susan Horton, female   DOB: 01-05-1971, 43 y.o.   MRN: [id number]  HPI Susan Horton is a 43 y.o. woman returning for f/u for DM (unclear if 1 or 2), dx in 1996 when pregnant with her daughter, insulin-dependent since 1999, uncontrolled, with complications (peripheral neuropathy). Last visit 1 mo ago.  Last HbA1c: Lab Results  Component Value Date   HGBA1C 13.5 Repeated and verified X2.* 04/14/2012   She is on a regimen of: Lantus - pens - 25 units bid - does not miss this Novolog - pens 5 units for a small meal (b'fast) 10 units for a medium meal (lunch) 15 units for a large meal (dinner) Add NovoLog sliding scale: - 150-175: + 1 units - 176-200: + 2 units - 201-225: + 3 units - >225: + 4 units It is unclear whether she has type I or type 2 diabetes, the fact that she is on metformin would point towards type II, however she was told in the past that she has no pancreatic reserve.   She will have surgery in 09/2013: tummy tuck >> needs clearance.   She checks her sugars 3x a day - no log or meter again and cannot remember values well: - am: 200s, occasionally 300s >> 250s >> 234-250 - mid-morning: 90 (if skips b'fast) - 134 - before lunch: 250-300s >> 200-250 >> 170s-200s, 354 x2 - before dinner: 200-250 >> 198-230 - before bedtime: 300-400s >> 250-<300 >> n/c Lowest: 90. She has hypoglycemia awareness even in 150. Highest 400 x1 (boil on leg) >> got ABx for it.  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   She cut down on sodas, but still have them (regular).  No DR, last eye exem: 03/2013 >> no DR. + tingling/numbness in feet >> sees podiatry, last visit this year. No CKD. Last BUN/creatinine was: Lab  Results  Component Value Date   BUN 17 03/23/2013   Lab Results  Component Value Date   CREATININE 0.80 03/23/2013   She does not have blurry vision, increased thirst and urination.   She had multiple visits to the ED and admissions with hyperglycemia, but per review of her chart, not DKA. Last ED visit: 03/2013, sugars then 500-600.  Review of Systems Constitutional: no weight gain/loss,  fatigue, no poor sleep Eyes: occasionally blurry vision, no xerophthalmia ENT: no sore throat, no nodules palpated in throat, no dysphagia/odynophagia, no hoarseness Cardiovascular: no CP/SOB/palpitations/swelling in hands and feet Respiratory: no cough/SOB Gastrointestinal: no N/V/D/C Musculoskeletal: no muscle/joint aches Skin: no rashes Neurological: no tremors/numbness/tingling/dizziness  Objective:   Physical Exam BP 118/86  Pulse 88  Temp(Src) 98.1 F (36.7 C) (Oral)  Resp 12  Wt 174 lb (78.926 kg)  SpO2 97% Wt Readings from Last 3 Encounters:  08/22/13 174 lb (78.926 kg)  07/26/13 173 lb (78.472 kg)  06/13/13 169 lb (76.658 kg)  Constitutional: overweight - abdominal, in NAD Eyes: PERRLA, EOMI, no exophthalmos ENT: moist mucous membranes, no thyromegaly, no cervical lymphadenopathy Cardiovascular: RRR, No MRG Respiratory: CTA B Gastrointestinal: abdomen soft, NT, ND, BS+ Musculoskeletal: no deformities, strength intact in all 4 Skin: moist, warm, no rashes Neurological: no tremor with outstretched  hands, DTR normal in all 4    Assessment:     1. DM - unclear If type I or type II, insulin-dependent, uncontrolled, with complications - peripheral neuropathy  She was on an insulin pump which she liked and her DM was controlled then, but lost her insurance. I did gave her brochures about insulin pumps but she did decide yet.  Patient has been diagnosed with diabetes when she was 35, during her pregnancy, but it is unclear whether this is type I or type II. She has not have  episodes of DKA, which would point more towards type 2 diabetes. I would like to check her C-peptide, however the patient mentions that she is rarely at or lower than 200, and the C-peptide measurement is only valid if the sugars are lower than that. Anti-GAD and ICA antibodies can also be tested. I plan to check these at next visits. Plan:     1. DM - Pt with slightly improved sugars since last visit, but no log or meter and she cannot remember the sugar ranges exactly. Her sugars are still high through=out the day and we discussed about cutting out her regular sodas. She already cut down, but she needs to stop them. She will use diet drinks for now, in an effort to transition to just water/unsweet tea. Since sugars at dinnertime = high >> will increase lunchtime insulin. Also, will increase the insulin from SSI. - I advised her to: Patient Instructions  Please continue Lantus 25 units 2x a day Change mealtime Novolog as follows: 5 units for a small meal (b'fast)  15 units for lunch and dinner Change the NovoLog sliding scale as follows:  - 150- 165: + 1 unit  - 166- 180: + 2 units  - 181- 195: + 3 units  - 196- 210: + 4 units  - 211- 225: + 5 units  - 226- 240: + 6 units  - 241- 255: + 7 units  - 256- 270: + 8 units  - 271- 285: + 9 units  - > 286: + 10 units Please stop at the lab. Please return in 1 month with your sugar log.  - will check a HbA1c - refilled her insulins - strongly advised her to bring me her log at next visit - Return in about 1 month (around 09/22/2013).   Office Visit on 08/22/2013  Component Date Value Ref Range Status  . Hemoglobin A1C 08/22/2013 11.6* 4.6 - 6.5 % Final   Glycemic Control Guidelines for People with Diabetes:Non Diabetic:  <6%Goal of Therapy: <7%Additional Action Suggested:  >8%    HbA1c better but still high.

## 2013-09-10 ENCOUNTER — Telehealth: Payer: Self-pay | Admitting: Internal Medicine

## 2013-09-10 NOTE — Telephone Encounter (Signed)
Pt calling regarding a1c level please return her call

## 2013-09-10 NOTE — Telephone Encounter (Signed)
Called and d/w her and also contacted Dr Lenon Curt to see what his thoughts are on this. Waiting for his reply.

## 2013-09-10 NOTE — Telephone Encounter (Signed)
Pt came by this morning requesting surgical clearance. Spoke with Dr Cruzita Lederer. She reviewed pt's A1c and said that unless the pt was having emergency surgery, she would not give surgical clearance if her A1c was not below 9. Pt's A1c is 11.6. Pt is having elective surgery and was visibly displeased that she was not given clearance. She requested a copy of her A1c. Returned pt's call. Pt stated that she would like to speak with Dr Cruzita Lederer concerning what she came by the office for this AM. Advised pt that she is in clinic and seeing pt's. Pt asked if Dr Cruzita Lederer would call her. Advised pt I would send her a message requesting her to call pt.

## 2013-10-03 ENCOUNTER — Ambulatory Visit: Payer: BC Managed Care – PPO | Admitting: Internal Medicine

## 2013-10-25 ENCOUNTER — Emergency Department (HOSPITAL_COMMUNITY)
Admission: EM | Admit: 2013-10-25 | Discharge: 2013-10-26 | Disposition: A | Discharge status: Home / Self Care | Payer: BC Managed Care – PPO | Attending: Dermatology | Admitting: Dermatology

## 2013-10-25 ENCOUNTER — Encounter (HOSPITAL_COMMUNITY): Payer: Self-pay | Admitting: Emergency Medicine

## 2013-10-25 DIAGNOSIS — E119 Type 2 diabetes mellitus without complications: Secondary | ICD-10-CM | POA: Insufficient documentation

## 2013-10-25 DIAGNOSIS — J45909 Unspecified asthma, uncomplicated: Secondary | ICD-10-CM | POA: Diagnosis not present

## 2013-10-25 DIAGNOSIS — Z720 Tobacco use: Secondary | ICD-10-CM | POA: Diagnosis not present

## 2013-10-25 DIAGNOSIS — Z872 Personal history of diseases of the skin and subcutaneous tissue: Secondary | ICD-10-CM | POA: Diagnosis not present

## 2013-10-25 DIAGNOSIS — Z79899 Other long term (current) drug therapy: Secondary | ICD-10-CM | POA: Insufficient documentation

## 2013-10-25 DIAGNOSIS — Z880 Allergy status to penicillin: Secondary | ICD-10-CM | POA: Diagnosis not present

## 2013-10-25 DIAGNOSIS — Z8719 Personal history of other diseases of the digestive system: Secondary | ICD-10-CM | POA: Insufficient documentation

## 2013-10-25 DIAGNOSIS — Z794 Long term (current) use of insulin: Secondary | ICD-10-CM | POA: Diagnosis not present

## 2013-10-25 DIAGNOSIS — M79605 Pain in left leg: Secondary | ICD-10-CM

## 2013-10-25 DIAGNOSIS — I82442 Acute embolism and thrombosis of left tibial vein: Secondary | ICD-10-CM | POA: Diagnosis not present

## 2013-10-25 LAB — CBG MONITORING, ED: Glucose-Capillary: 199 mg/dL — ABNORMAL HIGH (ref 70–99)

## 2013-10-25 LAB — D-DIMER, QUANTITATIVE (NOT AT ARMC): D DIMER QUANT: 0.87 ug{FEU}/mL — AB (ref 0.00–0.48)

## 2013-10-25 NOTE — ED Provider Notes (Signed)
CSN: [id number]     Arrival date & time 10/25/13  2212 History  This chart was scribed for a non-physician practitioner, Hyman Bible, PA-C working with Mirna Mires, MD by Martinique Peace, ED Scribe. The patient was seen in [facility]. The patient's care was started at 10:58 PM.      Chief Complaint  Patient presents with  . Leg Pain     (Consider location/radiation/quality/duration/timing/severity/associated sxs/prior Treatment) HPI HPI Comments: Susan Horton is a 43 y.o. female who presents to the Emergency Department complaining of lower left leg pain over the past few months. She states that her leg gave out on her recently and has been causing her extreme discomfort ever since. She denies any recent fall or trauma.  Pain is exacerbated with ambulation and movement. Pt has tried taking Aleve, Tylenol, and Arthritis medication without any relief. Pt reports history of severe neuropathy bilaterally in both feet which she states is becoming unbearable and bringing her to tears.  Pt reports that current pain causes her to not be able to sleep. She explains that her feet constantly feel cold and frequently experiences numbness and tingling in both feet. Pt later states that the Oxycodone she was prescribed from a previous surgery is the only thing that is able to provide her with  with some relief. She reports recent tummy tuck surgery in September of this year. History of blood clot while pregnant dating back to almost 20 years ago. History of DM. Pt is current everyday smoker.   Past Medical History  Diagnosis Date  . Hidradenitis   . Asthma     prn inhaler  . GERD (gastroesophageal reflux disease)   . Diabetes mellitus     IDDM  . Headache(784.0)     migraines   Past Surgical History  Procedure Laterality Date  . Tonsillectomy    . Inguinal hidradenitis excision  07/06/2010    bilateral  . Exploratory laparotomy  08/14/2005    lysis of adhesions, drainage of tubo-ovarian abscess   . Breast reduction surgery  2002  . Appendectomy  1995  . Cholecystectomy  1993  . Tubal ligation  1996  . Vaginal hysterectomy  08/04/2005    and cysto  . Hydradenitis excision  02/08/2011    Procedure: EXCISION HYDRADENITIS GROIN;  Surgeon: [name], MD;  Location: [facility];  Service: General;  Laterality: N/A;  Excisioin of Hidradenitis Left groin   Family History  Problem Relation Age of Onset  . Hypertension Father   . Hypertension Sister   . Hypertension Brother    History  Substance Use Topics  . Smoking status: Current Every Day Smoker -- 0.25 packs/day for 10 years    Types: Cigarettes  . Smokeless tobacco: Never Used     Comment: 1 pack/week  . Alcohol Use: No   OB History   Grav Para Term Preterm Abortions TAB SAB Ect Mult Living                 Review of Systems  Musculoskeletal:       Left leg pain.   Skin:       Dryness and peeling of feet bilaterally.   Neurological: Positive for weakness and numbness.      Allergies  Penicillins  Home Medications   Prior to Admission medications   Medication Sig Start Date End Date Taking? Authorizing Provider  albuterol (PROVENTIL HFA;VENTOLIN HFA) 108 (90 BASE) MCG/ACT inhaler Inhale 1-2 puffs into the lungs  every 6 (six) hours as needed for wheezing. 12/17/11  Yes Adlih Moreno-Coll, MD  diazepam (VALIUM) 5 MG tablet Take 1 tablet (5 mg total) by mouth 2 (two) times daily. 06/18/13  Yes Jennifer L Piepenbrink, PA-C  ibuprofen (ADVIL,MOTRIN) 800 MG tablet Take 800 mg by mouth every 6 (six) hours as needed for moderate pain. 06/18/13  Yes Jennifer L Piepenbrink, PA-C  insulin aspart (NOVOLOG FLEXPEN) 100 UNIT/ML FlexPen Inject 5-20 Units into the skin 3 (three) times daily with meals. 08/22/13  Yes Philemon Kingdom, MD  Insulin Glargine (LANTUS) 100 UNIT/ML Solostar Pen Inject 25 Units into the skin 2 (two) times daily. 08/22/13  Yes Philemon Kingdom, MD  lidocaine (LIDODERM) 5 % Place 1 patch onto  the skin daily. Remove & Discard patch within 12 hours or as directed by MD 06/18/13  Yes Anderson Malta L Piepenbrink, PA-C  methocarbamol (ROBAXIN) 500 MG tablet Take 500 mg by mouth every 6 (six) hours as needed for muscle spasms. 06/20/13  Yes Hannah Muthersbaugh, PA-C  oxyCODONE-acetaminophen (PERCOCET/ROXICET) 5-325 MG per tablet Take 1 tablet by mouth every 6 (six) hours as needed for moderate pain. 06/20/13 06/20/14 Yes Hannah Muthersbaugh, PA-C  ACCU-CHEK AVIVA PLUS test strip  07/18/13   Historical Provider, MD  B-D ULTRAFINE III SHORT PEN 31G X 8 MM MISC  07/12/13   Historical Provider, MD   BP 160/91  Pulse 103  Temp(Src) 98.7 F (37.1 C) (Oral)  Resp 18  SpO2 100% Physical Exam  Nursing note and vitals reviewed. Constitutional: She appears well-developed and well-nourished. No distress.  HENT:  Head: Normocephalic and atraumatic.  Mouth/Throat: Oropharynx is clear and moist.  Neck: Normal range of motion.  Cardiovascular: Normal rate, regular rhythm and normal heart sounds.   Pulses:      Dorsalis pedis pulses are 2+ on the right side, and 2+ on the left side.  Pulmonary/Chest: Effort normal and breath sounds normal.  Musculoskeletal: Normal range of motion.  Full ROM of the left hip, knee, and ankle.  No erythema, edema, or warmth of the left leg. Increased pain with flexion of the left hip.    Neurological: She is alert.  Sensation of both feet intact, but patient reports slightly decreased, which is baseline.    Skin: Skin is warm and dry.  Psychiatric: She has a normal mood and affect.    ED Course  Procedures (including critical care time) Labs Review Labs Reviewed  CBG MONITORING, ED - Abnormal; Notable for the following:    Glucose-Capillary 199 (*)    All other components within normal limits    Imaging Review No results found.   EKG Interpretation None     Medications - No data to display  11:06 PM- Treatment plan was discussed with patient who verbalizes  understanding and agrees.   MDM   Final diagnoses:  None   Patient presenting with pain of her entire left leg.  Full ROM of all joints.  No signs of infection.  No erythema or swelling of the leg.  Patient neurovascularly intact.  No injury or trauma.  Due to previous history of DVT a d-dimer was ordered, which was mildly elevated.  Patient scheduled for outpatient lower extremity venous doppler tomorrow morning.  Patient declined Lovenox shot this evening.  She denies SOB or CP.  Patient stable for discharge.    I personally performed the services described in this documentation, which was scribed in my presence. The recorded information has been reviewed and is accurate.  Hyman Bible, PA-C 10/26/13 [phone]

## 2013-10-25 NOTE — ED Notes (Addendum)
Pt reports "aching" to left leg x 3 months. States "today it felt like my knee was going to give out on me." Pt ambulatory to triage. Denies any injury. Pt also reports neuropathy to bilateral feet. States she was started on medication for neuropathy, but no relief. Pt in NAD. Pulses/sensation intact. Pt reports controlling diabetes, normal BG "is around 200." Denies missing any insulin.

## 2013-10-26 ENCOUNTER — Ambulatory Visit (HOSPITAL_COMMUNITY)
Admission: RE | Admit: 2013-10-26 | Discharge: 2013-10-26 | Disposition: A | Discharge status: Home / Self Care | Payer: BC Managed Care – PPO | Source: Ambulatory Visit | Attending: Emergency Medicine | Admitting: Emergency Medicine

## 2013-10-26 ENCOUNTER — Emergency Department (HOSPITAL_COMMUNITY)
Admission: EM | Admit: 2013-10-26 | Discharge: 2013-10-26 | Disposition: A | Discharge status: Home / Self Care | Payer: BC Managed Care – PPO | Attending: Emergency Medicine | Admitting: Emergency Medicine

## 2013-10-26 ENCOUNTER — Encounter (HOSPITAL_COMMUNITY): Payer: Self-pay | Admitting: Emergency Medicine

## 2013-10-26 DIAGNOSIS — Z3202 Encounter for pregnancy test, result negative: Secondary | ICD-10-CM | POA: Insufficient documentation

## 2013-10-26 DIAGNOSIS — Z79899 Other long term (current) drug therapy: Secondary | ICD-10-CM | POA: Diagnosis not present

## 2013-10-26 DIAGNOSIS — Z8719 Personal history of other diseases of the digestive system: Secondary | ICD-10-CM | POA: Diagnosis not present

## 2013-10-26 DIAGNOSIS — M79605 Pain in left leg: Secondary | ICD-10-CM | POA: Diagnosis present

## 2013-10-26 DIAGNOSIS — Z9889 Other specified postprocedural states: Secondary | ICD-10-CM | POA: Insufficient documentation

## 2013-10-26 DIAGNOSIS — J45909 Unspecified asthma, uncomplicated: Secondary | ICD-10-CM | POA: Diagnosis not present

## 2013-10-26 DIAGNOSIS — I82402 Acute embolism and thrombosis of unspecified deep veins of left lower extremity: Secondary | ICD-10-CM

## 2013-10-26 DIAGNOSIS — Z7901 Long term (current) use of anticoagulants: Secondary | ICD-10-CM | POA: Insufficient documentation

## 2013-10-26 DIAGNOSIS — Z880 Allergy status to penicillin: Secondary | ICD-10-CM | POA: Diagnosis not present

## 2013-10-26 DIAGNOSIS — Z872 Personal history of diseases of the skin and subcutaneous tissue: Secondary | ICD-10-CM | POA: Diagnosis not present

## 2013-10-26 DIAGNOSIS — Z794 Long term (current) use of insulin: Secondary | ICD-10-CM | POA: Diagnosis not present

## 2013-10-26 DIAGNOSIS — Z720 Tobacco use: Secondary | ICD-10-CM | POA: Insufficient documentation

## 2013-10-26 DIAGNOSIS — M79609 Pain in unspecified limb: Secondary | ICD-10-CM

## 2013-10-26 DIAGNOSIS — E119 Type 2 diabetes mellitus without complications: Secondary | ICD-10-CM | POA: Insufficient documentation

## 2013-10-26 DIAGNOSIS — I82442 Acute embolism and thrombosis of left tibial vein: Secondary | ICD-10-CM | POA: Insufficient documentation

## 2013-10-26 HISTORY — DX: Acute embolism and thrombosis of unspecified deep veins of unspecified lower extremity: I82.409

## 2013-10-26 LAB — COMPREHENSIVE METABOLIC PANEL
ALBUMIN: 2.6 g/dL — AB (ref 3.5–5.2)
ALK PHOS: 112 U/L (ref 39–117)
ALT: 7 U/L (ref 0–35)
ANION GAP: 10 (ref 5–15)
AST: 7 U/L (ref 0–37)
BUN: 17 mg/dL (ref 6–23)
CO2: 24 mEq/L (ref 19–32)
Calcium: 8.6 mg/dL (ref 8.4–10.5)
Chloride: 102 mEq/L (ref 96–112)
Creatinine, Ser: 0.82 mg/dL (ref 0.50–1.10)
GFR calc Af Amer: >90 mL/min (ref >90)
GFR calc non Af Amer: 86 mL/min — ABNORMAL LOW (ref >90)
Glucose, Bld: 269 mg/dL — ABNORMAL HIGH (ref 70–99)
POTASSIUM: 4.2 meq/L (ref 3.7–5.3)
SODIUM: 136 meq/L — AB (ref 137–147)
TOTAL PROTEIN: 6.7 g/dL (ref 6.0–8.3)
Total Bilirubin: <0.2 mg/dL — ABNORMAL LOW (ref 0.3–1.2)

## 2013-10-26 LAB — URINALYSIS, ROUTINE W REFLEX MICROSCOPIC
BILIRUBIN URINE: NEGATIVE
GLUCOSE, UA: 500 mg/dL — AB
KETONES UR: NEGATIVE mg/dL
Leukocytes, UA: NEGATIVE
Nitrite: NEGATIVE
PH: 5 (ref 5.0–8.0)
PROTEIN: 100 mg/dL — AB
Specific Gravity, Urine: 1.019 (ref 1.005–1.030)
Urobilinogen, UA: 0.2 mg/dL (ref 0.0–1.0)

## 2013-10-26 LAB — CBC WITH DIFFERENTIAL/PLATELET
BASOS PCT: 0 % (ref 0–1)
Basophils Absolute: 0 10*3/uL (ref 0.0–0.1)
Eosinophils Absolute: 0.1 10*3/uL (ref 0.0–0.7)
Eosinophils Relative: 1 % (ref 0–5)
HCT: 29.2 % — ABNORMAL LOW (ref 36.0–46.0)
Hemoglobin: 9.4 g/dL — ABNORMAL LOW (ref 12.0–15.0)
Lymphocytes Relative: 33 % (ref 12–46)
Lymphs Abs: 3 10*3/uL (ref 0.7–4.0)
MCH: 25.8 pg — ABNORMAL LOW (ref 26.0–34.0)
MCHC: 32.2 g/dL (ref 30.0–36.0)
MCV: 80 fL (ref 78.0–100.0)
Monocytes Absolute: 0.4 10*3/uL (ref 0.1–1.0)
Monocytes Relative: 4 % (ref 3–12)
NEUTROS PCT: 62 % (ref 43–77)
Neutro Abs: 5.7 10*3/uL (ref 1.7–7.7)
PLATELETS: 338 10*3/uL (ref 150–400)
RBC: 3.65 MIL/uL — AB (ref 3.87–5.11)
RDW: 13.3 % (ref 11.5–15.5)
WBC: 9.2 10*3/uL (ref 4.0–10.5)

## 2013-10-26 LAB — POC OCCULT BLOOD, ED: Fecal Occult Bld: NEGATIVE

## 2013-10-26 LAB — URINE MICROSCOPIC-ADD ON

## 2013-10-26 LAB — POC URINE PREG, ED: Preg Test, Ur: NEGATIVE

## 2013-10-26 MED ORDER — XARELTO VTE STARTER PACK 15 & 20 MG PO TBPK: 15.0000 mg | ORAL_TABLET | ORAL | Status: DC

## 2013-10-26 MED ORDER — RIVAROXABAN (XARELTO) EDUCATION KIT FOR DVT/PE PATIENTS
PACK | Freq: Once | Status: AC
  Administered 2013-10-26: 11:00:00
  Filled 2013-10-26: qty 1

## 2013-10-26 NOTE — ED Provider Notes (Signed)
CSN: [id number]     Arrival date & time 10/26/13  5329 History   First MD Initiated Contact with Patient 10/26/13 [phone]     Chief Complaint  Patient presents with  . Leg Pain     (Consider location/radiation/quality/duration/timing/severity/associated sxs/prior Treatment) The history is provided by the patient. No language interpreter was used.  Susan Horton is a 43 y/o F with PMhx of hidradenitis, asthma, GERD, DM, headache, DVT presenting to the ED with left leg pain that has been ongoing for the past 3.5 months. Patient reported that the pain is localized to the left leg described as a constant aching pain that is worse after walking for long periods of time. Patient reported that she has been having intermittent swelling noted to the left lower extremity - more so after a long day being on her feet. Stated that she has been having burning, tingling to her feet bilaterally noted many years - reported that she was diagnosed with a nail in her right foot in January of 2015 - noted on xray - stated that she does not know how the nail got there, denied pain and infection. Patient reported that she was seen and assessed in the ED yesterday and was offered Lovenox - stated that she decline and would follow-up today. Patient went to the Vascular where [name] was performed with positive findings of DVT in her left lower extremity. Denied chest pain, shortness of breath, difficulty breathing, long travels. PCP Dr. Ouida Sills  Past Medical History  Diagnosis Date  . Hidradenitis   . Asthma     prn inhaler  . GERD (gastroesophageal reflux disease)   . Diabetes mellitus     IDDM  . Headache(784.0)     migraines  . DVT (deep venous thrombosis)    Past Surgical History  Procedure Laterality Date  . Tonsillectomy    . Inguinal hidradenitis excision  07/06/2010    bilateral  . Exploratory laparotomy  08/14/2005    lysis of adhesions, drainage of tubo-ovarian abscess  . Breast reduction surgery  2002  .  Appendectomy  1995  . Cholecystectomy  1993  . Tubal ligation  1996  . Vaginal hysterectomy  08/04/2005    and cysto  . Hydradenitis excision  02/08/2011    Procedure: EXCISION HYDRADENITIS GROIN;  Surgeon: [name], MD;  Location: [place];  Service: General;  Laterality: N/A;  Excisioin of Hidradenitis Left groin   Family History  Problem Relation Age of Onset  . Hypertension Father   . Hypertension Sister   . Hypertension Brother    History  Substance Use Topics  . Smoking status: Current Every Day Smoker -- 0.25 packs/day for 10 years    Types: Cigarettes  . Smokeless tobacco: Never Used     Comment: 1 pack/week  . Alcohol Use: No   OB History   Grav Para Term Preterm Abortions TAB SAB Ect Mult Living                 Review of Systems  Constitutional: Negative for fever and chills.  Respiratory: Negative for chest tightness and shortness of breath.   Cardiovascular: Positive for leg swelling. Negative for chest pain.  Musculoskeletal: Positive for myalgias (left leg pain ).  Neurological: Negative for weakness.      Allergies  Penicillins  Home Medications   Prior to Admission medications   Medication Sig Start Date End Date Taking? Authorizing Provider  albuterol (PROVENTIL HFA;VENTOLIN HFA) 108 (90  BASE) MCG/ACT inhaler Inhale 1-2 puffs into the lungs every 6 (six) hours as needed for wheezing. 12/17/11  Yes Adlih Moreno-Coll, MD  diazepam (VALIUM) 5 MG tablet Take 1 tablet (5 mg total) by mouth 2 (two) times daily. 06/18/13  Yes Jennifer L Piepenbrink, PA-C  ibuprofen (ADVIL,MOTRIN) 800 MG tablet Take 800 mg by mouth every 6 (six) hours as needed for moderate pain. 06/18/13  Yes Jennifer L Piepenbrink, PA-C  insulin aspart (NOVOLOG FLEXPEN) 100 UNIT/ML FlexPen Inject 5-20 Units into the skin 3 (three) times daily with meals. 08/22/13  Yes Philemon Kingdom, MD  Insulin Glargine (LANTUS) 100 UNIT/ML Solostar Pen Inject 25 Units into the skin 2  (two) times daily. 08/22/13  Yes Philemon Kingdom, MD  lidocaine (LIDODERM) 5 % Place 1 patch onto the skin daily. Remove & Discard patch within 12 hours or as directed by MD 06/18/13  Yes Anderson Malta L Piepenbrink, PA-C  methocarbamol (ROBAXIN) 500 MG tablet Take 500 mg by mouth every 6 (six) hours as needed for muscle spasms. 06/20/13  Yes Hannah Muthersbaugh, PA-C  oxyCODONE-acetaminophen (PERCOCET/ROXICET) 5-325 MG per tablet Take 1 tablet by mouth every 6 (six) hours as needed for moderate pain. 06/20/13 06/20/14 Yes Hannah Muthersbaugh, PA-C  ACCU-CHEK AVIVA PLUS test strip  07/18/13   Historical Provider, MD  B-D ULTRAFINE III SHORT PEN 31G X 8 MM MISC  07/12/13   Historical Provider, MD  XARELTO STARTER PACK 15 & 20 MG TBPK Take 15-20 mg by mouth as directed. Take as directed on package: Start with one $Remove'15mg'hkxcMWm$  tablet by mouth twice a day with food. On Day 22, switch to one $Remo'20mg'eXRxM$  tablet once a day with food. 10/26/13   Breken Nazari, PA-C   BP 140/73  Pulse 90  Temp(Src) 97.9 F (36.6 C) (Oral)  Resp 18  Wt 175 lb (79.379 kg)  SpO2 100% Physical Exam  Nursing note and vitals reviewed. Constitutional: She is oriented to person, place, and time. She appears well-developed and well-nourished. No distress.  HENT:  Head: Normocephalic and atraumatic.  Mouth/Throat: Oropharynx is clear and moist. No oropharyngeal exudate.  Eyes: Conjunctivae and EOM are normal. Pupils are equal, round, and reactive to light. Right eye exhibits no discharge. Left eye exhibits no discharge.  Neck: Normal range of motion. Neck supple. No tracheal deviation present.  Cardiovascular: Normal rate, regular rhythm and normal heart sounds.  Exam reveals no friction rub.   No murmur heard. Pulses:      Radial pulses are 2+ on the right side, and 2+ on the left side.       Dorsalis pedis pulses are 2+ on the right side, and 2+ on the left side.  Cap refill < 3 seconds Negative swelling or pitting edema noted to the lower  extremities bilaterally   Positive Homan's sign noted to the left lower extremity. Negative swelling, erythema, inflammation, warmth upon palpation, also received discoloration of the lower extremities noted   Pulmonary/Chest: Effort normal and breath sounds normal. No respiratory distress. She has no wheezes. She has no rales.  Genitourinary:  Rectal Exam: Negative swelling, erythema, inflammation,lesions, sores, hemorrhoids noted. Negative blood on glove. Brown stools noted on glove. Strong sphincter tone.   Musculoskeletal: Normal range of motion.  Full ROM to upper and lower extremities without difficulty noted, negative ataxia noted.  Lymphadenopathy:    She has no cervical adenopathy.  Neurological: She is alert and oriented to person, place, and time. No cranial nerve deficit. She exhibits normal muscle tone. Coordination  normal.  Cranial nerves III-XII grossly intact Strength 5+/5+ to lower extremities bilaterally with resistance applied, equal distribution noted Sensation intact with differentiation to sharp and dull touch   Skin: Skin is warm and dry. No rash noted. She is not diaphoretic. No erythema.  Psychiatric: She has a normal mood and affect. Her behavior is normal. Thought content normal.    ED Course  Procedures (including critical care time)  Author: Elmo Putt Service: Vascular Lab Author Type: Cardiovascular Sonographer   Filed: 10/26/2013 8:34 AM Note Time: 10/26/2013 8:33 AM Status: Signed   Editor: Doyne Keel Simonetti (Cardiovascular Sonographer)      *Preliminary Results*  Left lower extremity venous duplex completed.  Left lower extremity is positive for deep vein thrombosis involving the left posterior tibial veins. There is no evidence of left Baker's cyst.  10/26/2013 8:33 AM  Maudry Mayhew, RVT, RDCS, RDMS     Labs Review Labs Reviewed  CBC WITH DIFFERENTIAL - Abnormal; Notable for the following:    RBC 3.65 (*)    Hemoglobin 9.4 (*)     HCT 29.2 (*)    MCH 25.8 (*)    All other components within normal limits  COMPREHENSIVE METABOLIC PANEL - Abnormal; Notable for the following:    Sodium 136 (*)    Glucose, Bld 269 (*)    Albumin 2.6 (*)    Total Bilirubin <0.2 (*)    GFR calc non Af Amer 86 (*)    All other components within normal limits  URINALYSIS, ROUTINE W REFLEX MICROSCOPIC - Abnormal; Notable for the following:    APPearance HAZY (*)    Glucose, UA 500 (*)    Hgb urine dipstick MODERATE (*)    Protein, ur 100 (*)    All other components within normal limits  URINE MICROSCOPIC-ADD ON - Abnormal; Notable for the following:    Squamous Epithelial / LPF MANY (*)    Bacteria, UA FEW (*)    All other components within normal limits  OCCULT BLOOD X 1 CARD TO LAB, STOOL  POC URINE PREG, ED  POC OCCULT BLOOD, ED    Imaging Review No results found.   EKG Interpretation None      MDM   Final diagnoses:  Acute DVT (deep venous thrombosis), left    Medications  rivaroxaban Alveda Reasons) Education Kit for DVT/PE patients ( Does not apply Given 10/26/13 1053)   Filed Vitals:   10/26/13 1030 10/26/13 1051 10/26/13 1320 10/26/13 1345  BP: 152/62 151/85 140/73   Pulse:  82 90   Temp:    97.9 F (36.6 C)  TempSrc:      Resp:  15 18   Weight:      SpO2:  100% 100%     CBC unremarkable.decreased hemoglobin from 12.0 to 9.4 over similar time span-patient denied melena, hematochezia, hematuria, fatigue or weakness. Fecal occult negative. CMP noted mildly low sodium of 136. Glucose 269. Anion gap 10 mEq/L. Urine pregnancy negative. Urinalysis noted moderate hemoglobin negative nitrites or leukocytes-negative findings of infection. Venous duplex of left lower extremity positive for DVT involving the left posterior tibial veins. Patient denied chest pain, shortness of breath and difficulty breathing. Patient preferred tablets by mouth than shots. Patient presenting to emergency department with positive DVT  localized to the left posterior tibial veins. Incidental finding of drop in hemoglobin noted-patient denied menstrual cycles since 2007. Negative blood in stool noted on exam. Definitive etiology and drop of hemoglobin unknown, patient denies symptoms. Patient  started on Xarelto pack, patient educated on DVT and Xarelto by pharmacist. First dose of Xarelto given in ED setting. Patient denied chest pain, shortness of breath, difficulty breathing. Patient stable, afebrile. Patient not septic appearing. Discharged patient. Discharge patient was Xarelto. Referred patient to PCP to be monitored. Discussed with patient to closely monitor symptoms and if symptoms are to worsen or change to report back to the ED - strict return instructions given.  Patient agreed to plan of care, understood, all questions answered.   Jamse Mead, PA-C 10/26/13 1356

## 2013-10-26 NOTE — Progress Notes (Signed)
*  Preliminary Results* Left lower extremity venous duplex completed. Left lower extremity is positive for deep vein thrombosis involving the left posterior tibial veins. There is no evidence of left Baker's cyst.  10/26/2013 8:33 AM  Maudry Mayhew, RVT, RDCS, RDMS

## 2013-10-26 NOTE — ED Notes (Signed)
Pt was seen last night for left leg pain has [name] this am and is positive for dvt in that left calf has hx of same in same leg

## 2013-10-26 NOTE — ED Notes (Signed)
Pharmacist at the bedside speaking with patient about medication.

## 2013-10-26 NOTE — Discharge Instructions (Signed)
Please call your doctor for a followup appointment within 24-48 hours. When you talk to your doctor please let them know that you were seen in the emergency department and have them acquire all of your records so that they can discuss the findings with you and formulate a treatment plan to fully care for your new and ongoing problems. Please call and set-up an appointment with your primary care provider to be followed Please do not take any anti-inflammatories with Xarelto for this can lead to internal bleeding - no Motrin, Ibuprofen, Aleve Please rest and stay hydrated Please avoid any physical or strenuous activity Please continue to monitor symptoms closely and if symptoms are to worsen or change (fever greater than 101, chills, sweating, nausea, vomiting, chest pain, shortness of breathe, difficulty breathing, weakness, numbness, tingling, worsening or changes to pain pattern, swelling to the leg, loss of sensation, coughing up blood) please report back to the Emergency Department immediately.    Deep Vein Thrombosis A deep vein thrombosis (DVT) is a blood clot that develops in the deep, larger veins of the leg, arm, or pelvis. These are more dangerous than clots that might form in veins near the surface of the body. A DVT can lead to serious and even life-threatening complications if the clot breaks off and travels in the bloodstream to the lungs.  A DVT can damage the valves in your leg veins so that instead of flowing upward, the blood pools in the lower leg. This is called post-thrombotic syndrome, and it can result in pain, swelling, discoloration, and sores on the leg. CAUSES Usually, several things contribute to the formation of blood clots. Contributing factors include:  The flow of blood slows down.  The inside of the vein is damaged in some way.  You have a condition that makes blood clot more easily. RISK FACTORS Some people are more likely than others to develop blood clots. Risk  factors include:   Smoking.  Being overweight (obese).  Sitting or lying still for a long time. This includes long-distance travel, paralysis, or recovery from an illness or surgery. Other factors that increase risk are:   Older age, especially over 71 years of age.  Having a family history of blood clots or if you have already had a blot clot.  Having major or lengthy surgery. This is especially true for surgery on the hip, knee, or belly (abdomen). Hip surgery is particularly high risk.  Having a long, thin tube (catheter) placed inside a vein during a medical procedure.  Breaking a hip or leg.  Having cancer or cancer treatment.  Pregnancy and childbirth.  Hormone changes make the blood clot more easily during pregnancy.  The fetus puts pressure on the veins of the pelvis.  There is a risk of injury to veins during delivery or a caesarean delivery. The risk is highest just after childbirth.  Medicines containing the female hormone estrogen. This includes birth control pills and hormone replacement therapy.  Other circulation or heart problems.  SIGNS AND SYMPTOMS When a clot forms, it can either partially or totally block the blood flow in that vein. Symptoms of a DVT can include:  Swelling of the leg or arm, especially if one side is much worse.  Warmth and redness of the leg or arm, especially if one side is much worse.  Pain in an arm or leg. If the clot is in the leg, symptoms may be more noticeable or worse when standing or walking. The symptoms of  a DVT that has traveled to the lungs (pulmonary embolism, PE) usually start suddenly and include:  Shortness of breath.  Coughing.  Coughing up blood or blood-tinged mucus.  Chest pain. The chest pain is often worse with deep breaths.  Rapid heartbeat. Anyone with these symptoms should get emergency medical treatment right away. Do not wait to see if the symptoms will go away. Call your local emergency services  (911 in the [place] if you have these symptoms. Do not drive yourself to the hospital. DIAGNOSIS If a DVT is suspected, your health care provider will take a full medical history and perform a physical exam. Tests that also may be required include:  Blood tests, including studies of the clotting properties of the blood.  Ultrasound to see if you have clots in your legs or lungs.  X-rays to show the flow of blood when dye is injected into the veins (venogram).  Studies of your lungs if you have any chest symptoms. PREVENTION  Exercise the legs regularly. Take a brisk 30-minute walk every day.  Maintain a weight that is appropriate for your height.  Avoid sitting or lying in bed for long periods of time without moving your legs.  Women, particularly those over the age of 68 years, should consider the risks and benefits of taking estrogen medicines, including birth control pills.  Do not smoke, especially if you take estrogen medicines.  Long-distance travel can increase your risk of DVT. You should exercise your legs by walking or pumping the muscles every hour.  Many of the risk factors above relate to situations that exist with hospitalization, either for illness, injury, or elective surgery. Prevention may include medical and nonmedical measures.  Your health care provider will assess you for the need for venous thromboembolism prevention when you are admitted to the hospital. If you are having surgery, your surgeon will assess you the day of or day after surgery. TREATMENT Once identified, a DVT can be treated. It can also be prevented in some circumstances. Once you have had a DVT, you may be at increased risk for a DVT in the future. The most common treatment for DVT is blood-thinning (anticoagulant) medicine, which reduces the blood's tendency to clot. Anticoagulants can stop new blood clots from forming and stop old clots from growing. They cannot dissolve existing clots. Your body  does this by itself over time. Anticoagulants can be given by mouth, through an IV tube, or by injection. Your health care provider will determine the best program for you. Other medicines or treatments that may be used are:  Heparin or related medicines (low molecular weight heparin) are often the first treatment for a blood clot. They act quickly. However, they cannot be taken orally and must be given either in shot form or by IV tube.  Heparin can cause a fall in a component of blood that stops bleeding and forms blood clots (platelets). You will be monitored with blood tests to be sure this does not occur.  Warfarin is an anticoagulant that can be swallowed. It takes a few days to start working, so usually heparin or related medicines are used in combination. Once warfarin is working, heparin is usually stopped.  Factor Xa inhibitor medicines, such as rivaroxaban and apixaban, also reduce blood clotting. These medicines are taken orally and can often be used without heparin or related medicines.  Less commonly, clot dissolving drugs (thrombolytics) are used to dissolve a DVT. They carry a high risk of bleeding, so they  are used mainly in severe cases where your life or a part of your body is threatened.  Very rarely, a blood clot in the leg needs to be removed surgically.  If you are unable to take anticoagulants, your health care provider may arrange for you to have a filter placed in a main vein in your abdomen. This filter prevents clots from traveling to your lungs. HOME CARE INSTRUCTIONS  Take all medicines as directed by your health care provider.  Learn as much as you can about DVT.  Wear a medical alert bracelet or carry a medical alert card.  Ask your health care provider how soon you can go back to normal activities. It is important to stay active to prevent blood clots. If you are on anticoagulant medicine, avoid contact sports.  It is very important to exercise. This is  especially important while traveling, sitting, or standing for long periods of time. Exercise your legs by walking or by tightening and relaxing your leg muscles regularly. Take frequent walks.  You may need to wear compression stockings. These are tight elastic stockings that apply pressure to the lower legs. This pressure can help keep the blood in the legs from clotting. Taking Warfarin Warfarin is a daily medicine that is taken by mouth. Your health care provider will advise you on the length of treatment (usually 3-6 months, sometimes lifelong). If you take warfarin:  Understand how to take warfarin and foods that can affect how warfarin works in Veterinary surgeon.  Too much and too little warfarin are both dangerous. Too much warfarin increases the risk of bleeding. Too little warfarin continues to allow the risk for blood clots. Warfarin and Regular Blood Testing While taking warfarin, you will need to have regular blood tests to measure your blood clotting time. These blood tests usually include both the prothrombin time (PT) and international normalized ratio (INR) tests. The PT and INR results allow your health care provider to adjust your dose of warfarin. It is very important that you have your PT and INR tested as often as directed by your health care provider.  Warfarin and Your Diet Avoid major changes in your diet, or notify your health care provider before changing your diet. Arrange a visit with a registered dietitian to answer your questions. Many foods, especially foods high in vitamin K, can interfere with warfarin and affect the PT and INR results. You should eat a consistent amount of foods high in vitamin K. Foods high in vitamin K include:   Spinach, kale, broccoli, cabbage, collard and turnip greens, Brussels sprouts, peas, cauliflower, seaweed, and parsley.  Beef and pork liver.  Green tea.  Soybean oil. Warfarin with Other Medicines Many medicines can interfere with  warfarin and affect the PT and INR results. You must:  Tell your health care provider about any and all medicines, vitamins, and supplements you take, including aspirin and other over-the-counter anti-inflammatory medicines. Be especially cautious with aspirin and anti-inflammatory medicines. Ask your health care provider before taking these.  Do not take or discontinue any prescribed or over-the-counter medicine except on the advice of your health care provider or pharmacist. Warfarin Side Effects Warfarin can have side effects, such as easy bruising and difficulty stopping bleeding. Ask your health care provider or pharmacist about other side effects of warfarin. You will need to:  Hold pressure over cuts for longer than usual.  Notify your dentist and other health care providers that you are taking warfarin before you undergo any  procedures where bleeding may occur. Warfarin with Alcohol and Tobacco   Drinking alcohol frequently can increase the effect of warfarin, leading to excess bleeding. It is best to avoid alcoholic drinks or to consume only very small amounts while taking warfarin. Notify your health care provider if you change your alcohol intake.   Do not use any tobacco products including cigarettes, chewing tobacco, or electronic cigarettes. If you smoke, quit. Ask your health care provider for help with quitting smoking. Alternative Medicines to Warfarin: Factor Xa Inhibitor Medicines  These blood-thinning medicines are taken by mouth, usually for several weeks or longer. It is important to take the medicine every single day at the same time each day.  There are no regular blood tests required when using these medicines.  There are fewer food and drug interactions than with warfarin.  The side effects of this class of medicine are similar to those of warfarin, including excessive bruising or bleeding. Ask your health care provider or pharmacist about other potential side  effects. SEEK MEDICAL CARE IF:  You notice a rapid heartbeat.  You feel weaker or more tired than usual.  You feel faint.  You notice increased bruising.  You feel your symptoms are not getting better in the time expected.  You believe you are having side effects of medicine. SEEK IMMEDIATE MEDICAL CARE IF:  You have chest pain.  You have trouble breathing.  You have new or increased swelling or pain in one leg.  You cough up blood.  You notice blood in vomit, in a bowel movement, or in urine. MAKE SURE YOU:  Understand these instructions.  Will watch your condition.  Will get help right away if you are not doing well or get worse. Document Released: 12/28/2004 Document Revised: 05/14/2013 Document Reviewed: 09/04/2012 [facility] Patient Information 2015 [place], [place]. This information is not intended to replace advice given to you by your health care provider. Make sure you discuss any questions you have with your health care provider.

## 2013-10-26 NOTE — ED Notes (Signed)
Pt informed to go for vascular study this at 830

## 2013-10-28 NOTE — ED Provider Notes (Signed)
Medical screening examination/treatment/procedure(s) were performed by non-physician practitioner and as supervising physician I was immediately available for consultation/collaboration.     Mirna Mires, MD 10/28/13 [phone]

## 2013-10-29 ENCOUNTER — Encounter (HOSPITAL_COMMUNITY): Payer: Self-pay | Admitting: Emergency Medicine

## 2013-10-29 ENCOUNTER — Emergency Department (HOSPITAL_COMMUNITY)
Admission: EM | Admit: 2013-10-29 | Discharge: 2013-10-29 | Disposition: A | Discharge status: Home / Self Care | Payer: BC Managed Care – PPO | Attending: Emergency Medicine | Admitting: Emergency Medicine

## 2013-10-29 ENCOUNTER — Emergency Department (HOSPITAL_COMMUNITY): Payer: BC Managed Care – PPO

## 2013-10-29 DIAGNOSIS — Z79899 Other long term (current) drug therapy: Secondary | ICD-10-CM | POA: Insufficient documentation

## 2013-10-29 DIAGNOSIS — Z872 Personal history of diseases of the skin and subcutaneous tissue: Secondary | ICD-10-CM | POA: Diagnosis not present

## 2013-10-29 DIAGNOSIS — Z720 Tobacco use: Secondary | ICD-10-CM | POA: Insufficient documentation

## 2013-10-29 DIAGNOSIS — Z8719 Personal history of other diseases of the digestive system: Secondary | ICD-10-CM | POA: Insufficient documentation

## 2013-10-29 DIAGNOSIS — Z3202 Encounter for pregnancy test, result negative: Secondary | ICD-10-CM | POA: Insufficient documentation

## 2013-10-29 DIAGNOSIS — Z880 Allergy status to penicillin: Secondary | ICD-10-CM | POA: Diagnosis not present

## 2013-10-29 DIAGNOSIS — M940 Chondrocostal junction syndrome [Tietze]: Secondary | ICD-10-CM | POA: Diagnosis not present

## 2013-10-29 DIAGNOSIS — J45909 Unspecified asthma, uncomplicated: Secondary | ICD-10-CM | POA: Insufficient documentation

## 2013-10-29 DIAGNOSIS — E119 Type 2 diabetes mellitus without complications: Secondary | ICD-10-CM | POA: Diagnosis not present

## 2013-10-29 DIAGNOSIS — Z794 Long term (current) use of insulin: Secondary | ICD-10-CM | POA: Insufficient documentation

## 2013-10-29 DIAGNOSIS — R079 Chest pain, unspecified: Secondary | ICD-10-CM | POA: Diagnosis present

## 2013-10-29 DIAGNOSIS — Z86718 Personal history of other venous thrombosis and embolism: Secondary | ICD-10-CM | POA: Diagnosis not present

## 2013-10-29 LAB — BASIC METABOLIC PANEL
ANION GAP: 12 (ref 5–15)
BUN: 16 mg/dL (ref 6–23)
CALCIUM: 9 mg/dL (ref 8.4–10.5)
CO2: 24 meq/L (ref 19–32)
Chloride: 98 mEq/L (ref 96–112)
Creatinine, Ser: 0.92 mg/dL (ref 0.50–1.10)
GFR calc Af Amer: 87 mL/min — ABNORMAL LOW (ref >90)
GFR, EST NON AFRICAN AMERICAN: 75 mL/min — AB (ref >90)
GLUCOSE: 312 mg/dL — AB (ref 70–99)
POTASSIUM: 3.6 meq/L — AB (ref 3.7–5.3)
SODIUM: 134 meq/L — AB (ref 137–147)

## 2013-10-29 LAB — POC URINE PREG, ED: Preg Test, Ur: NEGATIVE

## 2013-10-29 LAB — CBC
HCT: 31.8 % — ABNORMAL LOW (ref 36.0–46.0)
HEMOGLOBIN: 10.5 g/dL — AB (ref 12.0–15.0)
MCH: 25.5 pg — ABNORMAL LOW (ref 26.0–34.0)
MCHC: 33 g/dL (ref 30.0–36.0)
MCV: 77.2 fL — ABNORMAL LOW (ref 78.0–100.0)
Platelets: 361 10*3/uL (ref 150–400)
RBC: 4.12 MIL/uL (ref 3.87–5.11)
RDW: 13.1 % (ref 11.5–15.5)
WBC: 9.5 10*3/uL (ref 4.0–10.5)

## 2013-10-29 LAB — I-STAT TROPONIN, ED: TROPONIN I, POC: 0 ng/mL (ref 0.00–0.08)

## 2013-10-29 MED ORDER — IOHEXOL 350 MG/ML SOLN
100.0000 mL | Freq: Once | INTRAVENOUS | Status: AC | PRN
  Administered 2013-10-29: 100 mL via INTRAVENOUS

## 2013-10-29 MED ORDER — ONDANSETRON HCL 4 MG/2ML IJ SOLN
4.0000 mg | Freq: Once | INTRAMUSCULAR | Status: AC
  Administered 2013-10-29: 4 mg via INTRAVENOUS
  Filled 2013-10-29: qty 2

## 2013-10-29 MED ORDER — KETOROLAC TROMETHAMINE 30 MG/ML IJ SOLN
30.0000 mg | Freq: Once | INTRAMUSCULAR | Status: AC
  Administered 2013-10-29: 30 mg via INTRAVENOUS
  Filled 2013-10-29: qty 1

## 2013-10-29 MED ORDER — SODIUM CHLORIDE 0.9 % IV BOLUS (SEPSIS)
1000.0000 mL | Freq: Once | INTRAVENOUS | Status: AC
  Administered 2013-10-29: 1000 mL via INTRAVENOUS

## 2013-10-29 NOTE — ED Notes (Signed)
Dr.Brtalik at bedside

## 2013-10-29 NOTE — ED Notes (Signed)
Presents with DVT-taking xarelto-recent tummy tuck surgery as well, today began having right sided chest pain with worse pain at inspiration and radiation into right shoulder blade. HR elevated at 107. +smoker. Pain rated 7/10

## 2013-10-29 NOTE — ED Notes (Signed)
Pt ambulated to restroom and began to vomit. Zofran ordered. MD aware

## 2013-10-29 NOTE — ED Provider Notes (Signed)
CSN: [id number]     Arrival date & time 10/29/13  1802 History   First MD Initiated Contact with Patient 10/29/13 1831     Chief Complaint  Patient presents with  . Chest Pain     (Consider location/radiation/quality/duration/timing/severity/associated sxs/prior Treatment) Patient is a 43 y.o. female presenting with chest pain. The history is provided by the patient.  Chest Pain Pain location:  R chest Pain quality: sharp   Pain radiates to:  Does not radiate Pain radiates to the back: no   Pain severity:  Mild Onset quality:  Gradual Duration:  1 day Timing:  Intermittent Progression:  Waxing and waning Chronicity:  New Context: breathing   Context: not at rest   Context comment:  Patient only has pain when breathing Relieved by:  Rest Worsened by:  Deep breathing Ineffective treatments:  None tried Associated symptoms: cough (states she coughs from smoking but denies hemoptysis)   Associated symptoms: no abdominal pain, no back pain, no dizziness, no fatigue, no fever, no lower extremity edema, no nausea, no orthopnea, no shortness of breath, not vomiting and no weakness   Risk factors: diabetes mellitus, prior DVT/PE (Currently on anticoagulation for DVT) and smoking   Risk factors: no coronary artery disease and not female     Past Medical History  Diagnosis Date  . Hidradenitis   . Asthma     prn inhaler  . GERD (gastroesophageal reflux disease)   . Diabetes mellitus     IDDM  . Headache(784.0)     migraines  . DVT (deep venous thrombosis)    Past Surgical History  Procedure Laterality Date  . Tonsillectomy    . Inguinal hidradenitis excision  07/06/2010    bilateral  . Exploratory laparotomy  08/14/2005    lysis of adhesions, drainage of tubo-ovarian abscess  . Breast reduction surgery  2002  . Appendectomy  1995  . Cholecystectomy  1993  . Tubal ligation  1996  . Vaginal hysterectomy  08/04/2005    and cysto  . Hydradenitis excision  02/08/2011   Procedure: EXCISION HYDRADENITIS GROIN;  Surgeon: [name], MD;  Location: [place];  Service: General;  Laterality: N/A;  Excisioin of Hidradenitis Left groin  . Tummy tuck     Family History  Problem Relation Age of Onset  . Hypertension Father   . Hypertension Sister   . Hypertension Brother    History  Substance Use Topics  . Smoking status: Current Every Day Smoker -- 0.25 packs/day for 10 years    Types: Cigarettes  . Smokeless tobacco: Never Used     Comment: 1 pack/week  . Alcohol Use: No   OB History   Grav Para Term Preterm Abortions TAB SAB Ect Mult Living                 Review of Systems  Constitutional: Negative for fever, activity change, appetite change and fatigue.  HENT: Negative for congestion and rhinorrhea.   Eyes: Negative for discharge, redness and itching.  Respiratory: Positive for cough (states she coughs from smoking but denies hemoptysis). Negative for shortness of breath and wheezing.   Cardiovascular: Positive for chest pain. Negative for orthopnea.  Gastrointestinal: Negative for nausea, vomiting, abdominal pain and diarrhea.  Genitourinary: Negative for dysuria and hematuria.  Musculoskeletal: Negative for back pain.  Skin: Negative for rash and wound.  Neurological: Negative for dizziness, syncope and weakness.      Allergies  Penicillins  Home Medications  Prior to Admission medications   Medication Sig Start Date End Date Taking? Authorizing Provider  albuterol (PROVENTIL HFA;VENTOLIN HFA) 108 (90 BASE) MCG/ACT inhaler Inhale 1-2 puffs into the lungs every 6 (six) hours as needed for wheezing. 12/17/11  Yes Adlih Moreno-Coll, MD  insulin aspart (NOVOLOG FLEXPEN) 100 UNIT/ML FlexPen Inject 5-20 Units into the skin 3 (three) times daily with meals. 08/22/13  Yes Philemon Kingdom, MD  Insulin Glargine (LANTUS) 100 UNIT/ML Solostar Pen Inject 25 Units into the skin 2 (two) times daily. 08/22/13  Yes Philemon Kingdom, MD  methocarbamol (ROBAXIN) 500 MG tablet Take 500 mg by mouth every 6 (six) hours as needed for muscle spasms. 06/20/13  Yes Hannah Muthersbaugh, PA-C  Rivaroxaban (XARELTO STARTER PACK) 15 & 20 MG TBPK Take 15-20 mg by mouth as directed. Take as directed on package: Start with one 15mg  tablet by mouth twice a day with food. On Day 22, switch to one 20mg  tablet once a day with food. 10/26/13  Yes Marissa Sciacca, PA-C  ACCU-CHEK AVIVA PLUS test strip  07/18/13   Historical Provider, MD  B-D ULTRAFINE III SHORT PEN 31G X 8 MM MISC  07/12/13   Historical Provider, MD   BP 137/79  Pulse 97  Temp(Src) 98.7 F (37.1 C) (Oral)  Resp 16  Ht 5\' 5"  (1.651 m)  Wt 175 lb (79.379 kg)  BMI 29.12 kg/m2  SpO2 100% Physical Exam  Constitutional: She is oriented to person, place, and time. She appears well-developed and well-nourished. No distress.  Resting comfortably well-appearing  HENT:  Head: Normocephalic and atraumatic.  Mouth/Throat: Oropharynx is clear and moist. No oropharyngeal exudate.  Eyes: Conjunctivae and EOM are normal. Pupils are equal, round, and reactive to light. Right eye exhibits no discharge. Left eye exhibits no discharge. No scleral icterus.  Neck: Normal range of motion. Neck supple.  Cardiovascular: Normal rate, regular rhythm and normal heart sounds.   No murmur heard. Pulmonary/Chest: Effort normal and breath sounds normal. No respiratory distress. She has no wheezes. She has no rales.  Abdominal: Soft. She exhibits no distension and no mass. There is no tenderness.  Neurological: She is alert and oriented to person, place, and time. She exhibits normal muscle tone. Coordination normal.  Skin: Skin is warm. No rash noted. She is not diaphoretic.    ED Course  Procedures (including critical care time) Labs Review Labs Reviewed  CBC - Abnormal; Notable for the following:    Hemoglobin 10.5 (*)    HCT 31.8 (*)    MCV 77.2 (*)    MCH 25.5 (*)    All other  components within normal limits  BASIC METABOLIC PANEL - Abnormal; Notable for the following:    Sodium 134 (*)    Potassium 3.6 (*)    Glucose, Bld 312 (*)    GFR calc non Af Amer 75 (*)    GFR calc Af Amer 87 (*)    All other components within normal limits  I-STAT TROPOININ, ED  POC URINE PREG, ED    Imaging Review Ct Angio Chest W/cm &/or Wo Cm  10/29/2013   CLINICAL DATA:  Patient reports upper right chest pain with inspiration as well as shortness of breath beginning last evening.  EXAM: CT ANGIOGRAPHY CHEST WITH CONTRAST  TECHNIQUE: Multidetector CT imaging of the chest was performed using the standard protocol during bolus administration of intravenous contrast. Multiplanar CT image reconstructions and MIPs were obtained to evaluate the vascular anatomy.  CONTRAST:  171mL OMNIPAQUE  IOHEXOL 350 MG/ML SOLN  COMPARISON:  Chest radiograph, 03/15/2013  FINDINGS: No evidence of a pulmonary embolus.  Heart, great vessels, mediastinum and hila are unremarkable. No neck base or axillary masses or pathologically enlarged lymph nodes.  Clear lungs.  No pleural effusion or pneumothorax.  Limited evaluation of the upper abdomen shows changes from cholecystectomy but is otherwise unremarkable.  No significant bony abnormality.  Review of the MIP images confirms the above findings.  IMPRESSION: 1. No evidence of a pulmonary embolus.  Negative exam.   Electronically Signed   By: Lajean Manes M.D.   On: 10/29/2013 20:47     EKG Interpretation None      MDM   MDM: 43 year old patient comes in with pleuritic chest pain since last night. Intermittent, no shortness of breath, no nausea, no diaphoresis. Currently on xarelto for DVT. no history of similar pain. No heart disease. Here patient resting comfortably, brief tachycardia but now resolved. No hypoxia or shortness of breath. EKG shows no new ischemic change. Troponin negative. Single troponin adequate for ACS as greater than 6 hours of symptoms.  CTA of chest pain to rule out pulmonary embolism. CTA negative for pneumonia, pneumothorax, PE. Inconsistent with aortic dissection and negative imaging. Unlikely ACS as patient story is very atypical, negative troponin, negative EKG. Given Toradol and feels better. Recommending continuing anticoagulation therapy followup PCP as needed. Can take NSAIDs for pain. Discharged.  Final diagnoses:  Costochondritis    Discharge Medication List as of 10/29/2013  9:31 PM     Takela N. Ouida Sills, [place] [address] [address] Lady [place] [phone]  Schedule an appointment as soon as possible for a visit As needed  [place] [id number] [place] [id number] [phone]  As needed  Discharged   Sol Passer, MD 10/29/13 2318

## 2013-10-29 NOTE — Discharge Instructions (Signed)
Chest Wall Pain °Chest wall pain is pain felt in or around the chest bones and muscles. It may take up to 6 weeks to get better. It may take longer if you are active. Chest wall pain can happen on its own. Other times, things like germs, injury, coughing, or exercise can cause the pain. °HOME CARE  °· Avoid activities that make you tired or cause pain. Try not to use your chest, belly (abdominal), or side muscles. Do not use heavy weights. °· Put ice on the sore area. °¨ Put ice in a plastic bag. °¨ Place a towel between your skin and the bag. °¨ Leave the ice on for 15-20 minutes for the first 2 days. °· Only take medicine as told by your doctor. °GET HELP RIGHT AWAY IF:  °· You have more pain or are very uncomfortable. °· You have a fever. °· Your chest pain gets worse. °· You have new problems. °· You feel sick to your stomach (nauseous) or throw up (vomit). °· You start to sweat or feel lightheaded. °· You have a cough with mucus (phlegm). °· You cough up blood. °MAKE SURE YOU:  °· Understand these instructions. °· Will watch your condition. °· Will get help right away if you are not doing well or get worse. °Document Released: 06/16/2007 Document Revised: 03/22/2011 Document Reviewed: 08/24/2010 °Exit[employer]® Patient Information ©2015 [employer]. This information is not intended to replace advice given to you by your health care provider. Make sure you discuss any questions you have with your health care provider. ° °

## 2013-10-29 NOTE — ED Notes (Addendum)
Pt c/o R sided chest pain starting last night. Pain described as an aching pain that becomes sharp on inspiration. Recent diagnosis of blood clot in L leg on Friday, has been taking Xarelto as prescribed.

## 2013-10-29 NOTE — ED Provider Notes (Signed)
Medical screening examination/treatment/procedure(s) were performed by non-physician practitioner and as supervising physician I was immediately available for consultation/collaboration.   EKG Interpretation None        Francine Graven, DO 10/29/13 1717

## 2013-10-30 NOTE — ED Provider Notes (Signed)
I saw and evaluated the patient, reviewed the resident's note and I agree with the findings and plan.   EKG Interpretation   Date/Time:  Monday October 29 2013 18:09:12 EDT Ventricular Rate:  108 PR Interval:  150 QRS Duration: 78 QT Interval:  330 QTC Calculation: 442 R Axis:   74 Text Interpretation:  Sinus tachycardia Otherwise normal ECG SINCE LAST  TRACING HEART RATE HAS INCREASED Confirmed by Alvino Chapel  MD, Ovid Curd  [phone]) on 10/30/2013 3:15:53 PM     Patient with chest pain. Tachycardia. On treatment for DVT. EKG overall reassuring. Says that it. CT negative for PE.  Jasper Riling. Alvino Chapel, MD 10/30/13 [phone]

## 2014-03-28 ENCOUNTER — Ambulatory Visit: Payer: Self-pay | Admitting: Podiatry

## 2014-06-04 ENCOUNTER — Emergency Department (HOSPITAL_COMMUNITY)
Admission: EM | Admit: 2014-06-04 | Discharge: 2014-06-04 | Disposition: A | Discharge status: Home / Self Care | Payer: BC Managed Care – PPO | Attending: Emergency Medicine | Admitting: Emergency Medicine

## 2014-06-04 ENCOUNTER — Encounter (HOSPITAL_COMMUNITY): Payer: Self-pay | Admitting: Physical Medicine and Rehabilitation

## 2014-06-04 ENCOUNTER — Emergency Department (HOSPITAL_COMMUNITY): Payer: BC Managed Care – PPO

## 2014-06-04 DIAGNOSIS — Z794 Long term (current) use of insulin: Secondary | ICD-10-CM | POA: Insufficient documentation

## 2014-06-04 DIAGNOSIS — R079 Chest pain, unspecified: Secondary | ICD-10-CM | POA: Diagnosis present

## 2014-06-04 DIAGNOSIS — D649 Anemia, unspecified: Secondary | ICD-10-CM | POA: Insufficient documentation

## 2014-06-04 DIAGNOSIS — J45909 Unspecified asthma, uncomplicated: Secondary | ICD-10-CM | POA: Diagnosis not present

## 2014-06-04 DIAGNOSIS — Z872 Personal history of diseases of the skin and subcutaneous tissue: Secondary | ICD-10-CM | POA: Diagnosis not present

## 2014-06-04 DIAGNOSIS — Z880 Allergy status to penicillin: Secondary | ICD-10-CM | POA: Insufficient documentation

## 2014-06-04 DIAGNOSIS — G43909 Migraine, unspecified, not intractable, without status migrainosus: Secondary | ICD-10-CM | POA: Diagnosis not present

## 2014-06-04 DIAGNOSIS — Z86718 Personal history of other venous thrombosis and embolism: Secondary | ICD-10-CM | POA: Diagnosis not present

## 2014-06-04 DIAGNOSIS — N289 Disorder of kidney and ureter, unspecified: Secondary | ICD-10-CM | POA: Diagnosis not present

## 2014-06-04 DIAGNOSIS — Z79899 Other long term (current) drug therapy: Secondary | ICD-10-CM | POA: Insufficient documentation

## 2014-06-04 DIAGNOSIS — E119 Type 2 diabetes mellitus without complications: Secondary | ICD-10-CM | POA: Insufficient documentation

## 2014-06-04 DIAGNOSIS — Z8719 Personal history of other diseases of the digestive system: Secondary | ICD-10-CM | POA: Insufficient documentation

## 2014-06-04 DIAGNOSIS — Z720 Tobacco use: Secondary | ICD-10-CM | POA: Insufficient documentation

## 2014-06-04 LAB — COMPREHENSIVE METABOLIC PANEL
ALT: 12 U/L — ABNORMAL LOW (ref 14–54)
ANION GAP: 8 (ref 5–15)
AST: 10 U/L — AB (ref 15–41)
Albumin: 2.9 g/dL — ABNORMAL LOW (ref 3.5–5.0)
Alkaline Phosphatase: 130 U/L — ABNORMAL HIGH (ref 38–126)
BUN: 34 mg/dL — ABNORMAL HIGH (ref 6–20)
CALCIUM: 9.2 mg/dL (ref 8.9–10.3)
CHLORIDE: 104 mmol/L (ref 101–111)
CO2: 24 mmol/L (ref 22–32)
Creatinine, Ser: 1.89 mg/dL — ABNORMAL HIGH (ref 0.44–1.00)
GFR calc Af Amer: 36 mL/min — ABNORMAL LOW (ref >60)
GFR calc non Af Amer: 31 mL/min — ABNORMAL LOW (ref >60)
Glucose, Bld: 255 mg/dL — ABNORMAL HIGH (ref 65–99)
Potassium: 4.7 mmol/L (ref 3.5–5.1)
Sodium: 136 mmol/L (ref 135–145)
Total Bilirubin: 0.1 mg/dL — ABNORMAL LOW (ref 0.3–1.2)
Total Protein: 7.4 g/dL (ref 6.5–8.1)

## 2014-06-04 LAB — CBC WITH DIFFERENTIAL/PLATELET
BASOS PCT: 0 % (ref 0–1)
Basophils Absolute: 0 10*3/uL (ref 0.0–0.1)
EOS PCT: 1 % (ref 0–5)
Eosinophils Absolute: 0.1 10*3/uL (ref 0.0–0.7)
HCT: 30.3 % — ABNORMAL LOW (ref 36.0–46.0)
Hemoglobin: 9.6 g/dL — ABNORMAL LOW (ref 12.0–15.0)
Lymphocytes Relative: 34 % (ref 12–46)
Lymphs Abs: 3.2 10*3/uL (ref 0.7–4.0)
MCH: 24.9 pg — ABNORMAL LOW (ref 26.0–34.0)
MCHC: 31.7 g/dL (ref 30.0–36.0)
MCV: 78.7 fL (ref 78.0–100.0)
Monocytes Absolute: 0.4 10*3/uL (ref 0.1–1.0)
Monocytes Relative: 4 % (ref 3–12)
Neutro Abs: 5.8 10*3/uL (ref 1.7–7.7)
Neutrophils Relative %: 61 % (ref 43–77)
Platelets: 438 10*3/uL — ABNORMAL HIGH (ref 150–400)
RBC: 3.85 MIL/uL — ABNORMAL LOW (ref 3.87–5.11)
RDW: 13 % (ref 11.5–15.5)
WBC: 9.5 10*3/uL (ref 4.0–10.5)

## 2014-06-04 LAB — CBG MONITORING, ED: Glucose-Capillary: 228 mg/dL — ABNORMAL HIGH (ref 65–99)

## 2014-06-04 LAB — I-STAT TROPONIN, ED: Troponin i, poc: 0 ng/mL (ref 0.00–0.08)

## 2014-06-04 NOTE — ED Notes (Signed)
MD at bedside. 

## 2014-06-04 NOTE — ED Notes (Signed)
Pt choosing to leave, but sts she will be back tomorrow for further evaluation.  Pt has a job interview she has been "waiting two years for" tomorrow.  Pt verbalized understanding of s/s to look out for and reasons to return to the ED immediately.  Pt understands risks associated with leaving at this time.    E-signature pad in room not working at this time.  Pt verbalized understanding of all discharge instructions and information in the packet.

## 2014-06-04 NOTE — ED Notes (Signed)
CBG 228 

## 2014-06-04 NOTE — ED Notes (Signed)
Pt presents to department for evaluation of diffuse chest pain, SOB, vomiting and hyperglycemia. 5/10 chest pain upon arrival to ED. Pt is alert and oriented x4.

## 2014-06-04 NOTE — ED Provider Notes (Signed)
CSN: [id number]     Arrival date & time 06/04/14  1755 History   First MD Initiated Contact with Patient 06/04/14 2131     Chief Complaint  Patient presents with  . Chest Pain  . Shortness of Breath  . Hyperglycemia     (Consider location/radiation/quality/duration/timing/severity/associated sxs/prior Treatment) HPI   Susan Horton is a 44 y.o. female who presents for evaluation of chest pain, which started 1 week ago, and was intermittent, but now has been constant since 8 AM this morning. The chest pain is described as a tight feeling. She also complains of vomiting 20 times today. She has ongoing vomiting for about 2 months. He has occasional headaches. She is taking her usual medications. She has talked to her doctor about the vomiting, but not chest pain. She's never had a cardiac disease. She has a appointment in the morning, for a job interview. There are no other known modifying factors.  Past Medical History  Diagnosis Date  . Hidradenitis   . Asthma     prn inhaler  . GERD (gastroesophageal reflux disease)   . Diabetes mellitus     IDDM  . Headache(784.0)     migraines  . DVT (deep venous thrombosis)    Past Surgical History  Procedure Laterality Date  . Tonsillectomy    . Inguinal hidradenitis excision  07/06/2010    bilateral  . Exploratory laparotomy  08/14/2005    lysis of adhesions, drainage of tubo-ovarian abscess  . Breast reduction surgery  2002  . Appendectomy  1995  . Cholecystectomy  1993  . Tubal ligation  1996  . Vaginal hysterectomy  08/04/2005    and cysto  . Hydradenitis excision  02/08/2011    Procedure: EXCISION HYDRADENITIS GROIN;  Surgeon: [name], MD;  Location: [place];  Service: General;  Laterality: N/A;  Excisioin of Hidradenitis Left groin  . Tummy tuck     Family History  Problem Relation Age of Onset  . Hypertension Father   . Hypertension Sister   . Hypertension Brother    History  Substance Use Topics   . Smoking status: Current Every Day Smoker -- 0.25 packs/day for 10 years    Types: Cigarettes  . Smokeless tobacco: Never Used     Comment: 1 pack/week  . Alcohol Use: No   OB History    No data available     Review of Systems  All other systems reviewed and are negative.     Allergies  Penicillins  Home Medications   Prior to Admission medications   Medication Sig Start Date End Date Taking? Authorizing Provider  insulin aspart (NOVOLOG FLEXPEN) 100 UNIT/ML FlexPen Inject 5-20 Units into the skin 3 (three) times daily with meals. 08/22/13  Yes Philemon Kingdom, MD  Insulin Glargine (LANTUS) 100 UNIT/ML Solostar Pen Inject 25 Units into the skin 2 (two) times daily. 08/22/13  Yes Philemon Kingdom, MD  lisinopril (PRINIVIL,ZESTRIL) 10 MG tablet Take 10 mg by mouth daily.   Yes Historical Provider, MD  ondansetron (ZOFRAN) 4 MG tablet Take 4 mg by mouth every 8 (eight) hours as needed for nausea or vomiting.   Yes Historical Provider, MD  albuterol (PROVENTIL HFA;VENTOLIN HFA) 108 (90 BASE) MCG/ACT inhaler Inhale 1-2 puffs into the lungs every 6 (six) hours as needed for wheezing. Patient not taking: Reported on 06/04/2014 12/17/11   Leonette Monarch Moreno-Coll, MD  methocarbamol (ROBAXIN) 500 MG tablet Take 500 mg by mouth every 6 (six) hours as  needed for muscle spasms. 06/20/13   Hannah Muthersbaugh, PA-C  Rivaroxaban (XARELTO STARTER PACK) 15 & 20 MG TBPK Take 15-20 mg by mouth as directed. Take as directed on package: Start with one 15mg  tablet by mouth twice a day with food. On Day 22, switch to one 20mg  tablet once a day with food. 10/26/13   Marissa Sciacca, PA-C   BP 148/72 mmHg  Pulse 93  Temp(Src) 98.6 F (37 C) (Oral)  Resp 14  SpO2 100% Physical Exam  Constitutional: She is oriented to person, place, and time. She appears well-developed and well-nourished. No distress.  HENT:  Head: Normocephalic and atraumatic.  Right Ear: External ear normal.  Left Ear: External ear  normal.  Eyes: Conjunctivae and EOM are normal. Pupils are equal, round, and reactive to light.  Neck: Normal range of motion and phonation normal. Neck supple.  Cardiovascular: Normal rate, regular rhythm and normal heart sounds.   Pulmonary/Chest: Effort normal and breath sounds normal. No respiratory distress. She has no wheezes. She exhibits no bony tenderness.  Abdominal: Soft. There is no tenderness.  Musculoskeletal: Normal range of motion.  Neurological: She is alert and oriented to person, place, and time. No cranial nerve deficit or sensory deficit. She exhibits normal muscle tone. Coordination normal.  Skin: Skin is warm, dry and intact.  Psychiatric: She has a normal mood and affect. Her behavior is normal. Judgment and thought content normal.  Nursing note and vitals reviewed.   ED Course  Procedures (including critical care time)  Medications - No data to display  Patient Vitals for the past 24 hrs:  BP Temp Temp src Pulse Resp SpO2  06/04/14 2156 148/72 mmHg - - 93 14 100 %  06/04/14 1803 140/78 mmHg 98.6 F (37 C) Oral 103 18 99 %    Heart score equals 5  10:51 PM Reevaluation with update and discussion. After initial assessment and treatment, an updated evaluation reveals she is comfortable at this time. Patient was offered admission to further evaluate and treat her chest pain . She understands that she has had elevated risk for a major cardiac event, if she is discharged. She is insistent to leave and go to her job interview, tomorrow morning. She is choosing to leave and not stay. [place] Review Labs Reviewed  CBC WITH DIFFERENTIAL/PLATELET - Abnormal; Notable for the following:    RBC 3.85 (*)    Hemoglobin 9.6 (*)    HCT 30.3 (*)    MCH 24.9 (*)    Platelets 438 (*)    All other components within normal limits  COMPREHENSIVE METABOLIC PANEL - Abnormal; Notable for the following:    Glucose, Bld 255 (*)    BUN 34 (*)    Creatinine, Ser  1.89 (*)    Albumin 2.9 (*)    AST 10 (*)    ALT 12 (*)    Alkaline Phosphatase 130 (*)    Total Bilirubin 0.1 (*)    GFR calc non Af Amer 31 (*)    GFR calc Af Amer 36 (*)    All other components within normal limits  CBG MONITORING, ED - Abnormal; Notable for the following:    Glucose-Capillary 228 (*)    All other components within normal limits  I-STAT TROPOININ, ED    Imaging Review Dg Chest 2 View  06/04/2014   CLINICAL DATA:  Anterior chest pain and shortness of Breath  EXAM: CHEST  2 VIEW  COMPARISON:  10/29/2013  FINDINGS: The heart size and mediastinal contours are within normal limits. Both lungs are clear. The visualized skeletal structures are unremarkable.  IMPRESSION: No active cardiopulmonary disease.   Electronically Signed   By: Inez Catalina M.D.   On: 06/04/2014 18:52     EKG Interpretation   Date/Time:  Tuesday Jun 04 2014 18:01:03 EDT Ventricular Rate:  108 PR Interval:  152 QRS Duration: 78 QT Interval:  312 QTC Calculation: 418 R Axis:   23 Text Interpretation:  Sinus tachycardia T wave abnormality, consider  lateral ischemia Abnormal ECG since last tracing no significant change  Confirmed by Delicia Berens  MD, Vira Agar [id number]) on 06/04/2014 10:04:12 PM      MDM   Final diagnoses:  Chest pain, unspecified chest pain type  Tobacco abuse    Nonspecific chest pain with elevated heart score. Incidental renal insufficiency, and anemia. Not actively vomiting in the emergency department. She has antiemetics, Zofran to use at home. Doubt serious bacterial infection, metabolic instability or impending vascular collapse.   Nursing Notes Reviewed/ Care Coordinated Applicable Imaging Reviewed Interpretation of Laboratory Data incorporated into ED treatment  The patient appears reasonably screened and/or stabilized for discharge and I doubt any other medical condition or other [facility] requiring further screening, evaluation, or treatment in the ED at this time prior to  discharge.  Plan: Home Medications- usual; Home Treatments-gradually advance diet,  rest, stop smoking; return here if the recommended treatment, does not improve the symptoms; Recommended follow up- rest   Daleen Bo, MD 06/04/14 2309

## 2014-06-04 NOTE — Discharge Instructions (Signed)
Chest Pain (Nonspecific)  It is often hard to give a specific diagnosis for the cause of chest pain. There is always a chance that your pain could be related to something serious, such as a heart attack or a blood clot in the lungs. You need to follow up with your health care provider for further evaluation.  CAUSES    Heartburn.   Pneumonia or bronchitis.   Anxiety or stress.   Inflammation around your heart (pericarditis) or lung (pleuritis or pleurisy).   A blood clot in the lung.   A collapsed lung (pneumothorax). It can develop suddenly on its own (spontaneous pneumothorax) or from trauma to the chest.   Shingles infection (herpes zoster virus).  The chest wall is composed of bones, muscles, and cartilage. Any of these can be the source of the pain.   The bones can be bruised by injury.   The muscles or cartilage can be strained by coughing or overwork.   The cartilage can be affected by inflammation and become sore (costochondritis).  DIAGNOSIS   Lab tests or other studies may be needed to find the cause of your pain. Your health care provider may have you take a test called an ambulatory electrocardiogram (ECG). An ECG records your heartbeat patterns over a 24-hour period. You may also have other tests, such as:   Transthoracic echocardiogram (TTE). During echocardiography, sound waves are used to evaluate how blood flows through your heart.   Transesophageal echocardiogram (TEE).   Cardiac monitoring. This allows your health care provider to monitor your heart rate and rhythm in real time.   Holter monitor. This is a portable device that records your heartbeat and can help diagnose heart arrhythmias. It allows your health care provider to track your heart activity for several days, if needed.   Stress tests by exercise or by giving medicine that makes the heart beat faster.  TREATMENT    Treatment depends on what may be causing your chest pain. Treatment may include:   Acid blockers for  heartburn.   Anti-inflammatory medicine.   Pain medicine for inflammatory conditions.   Antibiotics if an infection is present.   You may be advised to change lifestyle habits. This includes stopping smoking and avoiding alcohol, caffeine, and chocolate.   You may be advised to keep your head raised (elevated) when sleeping. This reduces the chance of acid going backward from your stomach into your esophagus.  Most of the time, nonspecific chest pain will improve within 2-3 days with rest and mild pain medicine.   HOME CARE INSTRUCTIONS    If antibiotics were prescribed, take them as directed. Finish them even if you start to feel better.   For the next few days, avoid physical activities that bring on chest pain. Continue physical activities as directed.   Do not use any tobacco products, including cigarettes, chewing tobacco, or electronic cigarettes.   Avoid drinking alcohol.   Only take medicine as directed by your health care provider.   Follow your health care provider's suggestions for further testing if your chest pain does not go away.   Keep any follow-up appointments you made. If you do not go to an appointment, you could develop lasting (chronic) problems with pain. If there is any problem keeping an appointment, call to reschedule.  SEEK MEDICAL CARE IF:    Your chest pain does not go away, even after treatment.   You have a rash with blisters on your chest.   You have   a fever.  SEEK IMMEDIATE MEDICAL CARE IF:    You have increased chest pain or pain that spreads to your arm, neck, jaw, back, or abdomen.   You have shortness of breath.   You have an increasing cough, or you cough up blood.   You have severe back or abdominal pain.   You feel nauseous or vomit.   You have severe weakness.   You faint.   You have chills.  This is an emergency. Do not wait to see if the pain will go away. Get medical help at once. Call your local emergency services (911 in [place] Do not drive  yourself to the hospital.  MAKE SURE YOU:    Understand these instructions.   Will watch your condition.   Will get help right away if you are not doing well or get worse.  Document Released: 10/07/2004 Document Revised: 01/02/2013 Document Reviewed: 08/03/2007  Exit[employer] Patient Information 2015 [employer]. This information is not intended to replace advice given to you by your health care provider. Make sure you discuss any questions you have with your health care provider.    Smoking Cessation  Quitting smoking is important to your health and has many advantages. However, it is not always easy to quit since nicotine is a very addictive drug. Oftentimes, people try 3 times or more before being able to quit. This document explains the best ways for you to prepare to quit smoking. Quitting takes hard work and a lot of effort, but you can do it.  ADVANTAGES OF QUITTING SMOKING   You will live longer, feel better, and live better.   Your body will feel the impact of quitting smoking almost immediately.   Within 20 minutes, blood pressure decreases. Your pulse returns to its normal level.   After 8 hours, carbon monoxide levels in the blood return to normal. Your oxygen level increases.   After 24 hours, the chance of having a heart attack starts to decrease. Your breath, hair, and body stop smelling like smoke.   After 48 hours, damaged nerve endings begin to recover. Your sense of taste and smell improve.   After 72 hours, the body is virtually free of nicotine. Your bronchial tubes relax and breathing becomes easier.   After 2 to 12 weeks, lungs can hold more air. Exercise becomes easier and circulation improves.   The risk of having a heart attack, stroke, cancer, or lung disease is greatly reduced.   After 1 year, the risk of coronary heart disease is cut in half.   After 5 years, the risk of stroke falls to the same as a nonsmoker.   After 10 years, the risk of lung cancer is cut in half and  the risk of other cancers decreases significantly.   After 15 years, the risk of coronary heart disease drops, usually to the level of a nonsmoker.   If you are pregnant, quitting smoking will improve your chances of having a healthy baby.   The people you live with, especially any children, will be healthier.   You will have extra money to spend on things other than cigarettes.  QUESTIONS TO THINK ABOUT BEFORE ATTEMPTING TO QUIT  You may want to talk about your answers with your health care provider.   Why do you want to quit?   If you tried to quit in the past, what helped and what did not?   What will be the most difficult situations for you after you   quit? How will you plan to handle them?   Who can help you through the tough times? Your family? Friends? A health care provider?   What pleasures do you get from smoking? What ways can you still get pleasure if you quit?  Here are some questions to ask your health care provider:   How can you help me to be successful at quitting?   What medicine do you think would be best for me and how should I take it?   What should I do if I need more help?   What is smoking withdrawal like? How can I get information on withdrawal?  GET READY   Set a quit date.   Change your environment by getting rid of all cigarettes, ashtrays, matches, and lighters in your home, car, or work. Do not let people smoke in your home.   Review your past attempts to quit. Think about what worked and what did not.  GET SUPPORT AND ENCOURAGEMENT  You have a better chance of being successful if you have help. You can get support in many ways.   Tell your family, friends, and coworkers that you are going to quit and need their support. Ask them not to smoke around you.   Get individual, group, or telephone counseling and support. Programs are available at local hospitals and health centers. Call your local health department for information about programs in your area.   Spiritual  beliefs and practices may help some smokers quit.   Download a "quit meter" on your computer to keep track of quit statistics, such as how long you have gone without smoking, cigarettes not smoked, and money saved.   Get a self-help book about quitting smoking and staying off tobacco.  LEARN NEW SKILLS AND BEHAVIORS   Distract yourself from urges to smoke. Talk to someone, go for a walk, or occupy your time with a task.   Change your normal routine. Take a different route to work. Drink tea instead of coffee. Eat breakfast in a different place.   Reduce your stress. Take a hot bath, exercise, or read a book.   Plan something enjoyable to do every day. Reward yourself for not smoking.   Explore interactive web-based programs that specialize in helping you quit.  GET MEDICINE AND USE IT CORRECTLY  Medicines can help you stop smoking and decrease the urge to smoke. Combining medicine with the above behavioral methods and support can greatly increase your chances of successfully quitting smoking.   Nicotine replacement therapy helps deliver nicotine to your body without the negative effects and risks of smoking. Nicotine replacement therapy includes nicotine gum, lozenges, inhalers, nasal sprays, and skin patches. Some may be available over-the-counter and others require a prescription.   Antidepressant medicine helps people abstain from smoking, but how this works is unknown. This medicine is available by prescription.   Nicotinic receptor partial agonist medicine simulates the effect of nicotine in your brain. This medicine is available by prescription.  Ask your health care provider for advice about which medicines to use and how to use them based on your health history. Your health care provider will tell you what side effects to look out for if you choose to be on a medicine or therapy. Carefully read the information on the package. Do not use any other product containing nicotine while using a nicotine  replacement product.   RELAPSE OR DIFFICULT SITUATIONS  Most relapses occur within the first 3 months after quitting. Do   not be discouraged if you start smoking again. Remember, most people try several times before finally quitting. You may have symptoms of withdrawal because your body is used to nicotine. You may crave cigarettes, be irritable, feel very hungry, cough often, get headaches, or have difficulty concentrating. The withdrawal symptoms are only temporary. They are strongest when you first quit, but they will go away within 10-14 days.  To reduce the chances of relapse, try to:   Avoid drinking alcohol. Drinking lowers your chances of successfully quitting.   Reduce the amount of caffeine you consume. Once you quit smoking, the amount of caffeine in your body increases and can give you symptoms, such as a rapid heartbeat, sweating, and anxiety.   Avoid smokers because they can make you want to smoke.   Do not let weight gain distract you. Many smokers will gain weight when they quit, usually less than 10 pounds. Eat a healthy diet and stay active. You can always lose the weight gained after you quit.   Find ways to improve your mood other than smoking.  FOR MORE INFORMATION   www.smokefree.gov   Document Released: 12/22/2000 Document Revised: 05/14/2013 Document Reviewed: 04/08/2011  [employer] Patient Information 2015 [employer]. This information is not intended to replace advice given to you by your health care provider. Make sure you discuss any questions you have with your health care provider.

## 2014-06-05 ENCOUNTER — Observation Stay (HOSPITAL_COMMUNITY)
Admission: EM | Admit: 2014-06-05 | Discharge: 2014-06-06 | Disposition: A | Discharge status: Home / Self Care | Payer: BC Managed Care – PPO | Attending: Oncology | Admitting: Oncology

## 2014-06-05 ENCOUNTER — Encounter (HOSPITAL_COMMUNITY): Payer: Self-pay | Admitting: Emergency Medicine

## 2014-06-05 ENCOUNTER — Emergency Department (HOSPITAL_COMMUNITY): Payer: BC Managed Care – PPO

## 2014-06-05 DIAGNOSIS — N179 Acute kidney failure, unspecified: Secondary | ICD-10-CM | POA: Diagnosis not present

## 2014-06-05 DIAGNOSIS — E8809 Other disorders of plasma-protein metabolism, not elsewhere classified: Secondary | ICD-10-CM

## 2014-06-05 DIAGNOSIS — E1142 Type 2 diabetes mellitus with diabetic polyneuropathy: Secondary | ICD-10-CM | POA: Diagnosis present

## 2014-06-05 DIAGNOSIS — Z86718 Personal history of other venous thrombosis and embolism: Secondary | ICD-10-CM | POA: Insufficient documentation

## 2014-06-05 DIAGNOSIS — F17210 Nicotine dependence, cigarettes, uncomplicated: Secondary | ICD-10-CM | POA: Diagnosis not present

## 2014-06-05 DIAGNOSIS — E1165 Type 2 diabetes mellitus with hyperglycemia: Secondary | ICD-10-CM | POA: Diagnosis not present

## 2014-06-05 DIAGNOSIS — E871 Hypo-osmolality and hyponatremia: Secondary | ICD-10-CM | POA: Diagnosis not present

## 2014-06-05 DIAGNOSIS — D509 Iron deficiency anemia, unspecified: Secondary | ICD-10-CM | POA: Diagnosis not present

## 2014-06-05 DIAGNOSIS — K3184 Gastroparesis: Secondary | ICD-10-CM | POA: Insufficient documentation

## 2014-06-05 DIAGNOSIS — J45909 Unspecified asthma, uncomplicated: Secondary | ICD-10-CM | POA: Diagnosis not present

## 2014-06-05 DIAGNOSIS — K219 Gastro-esophageal reflux disease without esophagitis: Secondary | ICD-10-CM

## 2014-06-05 DIAGNOSIS — I825Z2 Chronic embolism and thrombosis of unspecified deep veins of left distal lower extremity: Secondary | ICD-10-CM | POA: Diagnosis not present

## 2014-06-05 DIAGNOSIS — Z90710 Acquired absence of both cervix and uterus: Secondary | ICD-10-CM | POA: Diagnosis not present

## 2014-06-05 DIAGNOSIS — R0789 Other chest pain: Secondary | ICD-10-CM | POA: Diagnosis not present

## 2014-06-05 DIAGNOSIS — IMO0002 Reserved for concepts with insufficient information to code with codable children: Secondary | ICD-10-CM | POA: Diagnosis present

## 2014-06-05 DIAGNOSIS — I10 Essential (primary) hypertension: Secondary | ICD-10-CM | POA: Diagnosis not present

## 2014-06-05 DIAGNOSIS — Z794 Long term (current) use of insulin: Secondary | ICD-10-CM | POA: Diagnosis not present

## 2014-06-05 DIAGNOSIS — R739 Hyperglycemia, unspecified: Secondary | ICD-10-CM

## 2014-06-05 DIAGNOSIS — R1110 Vomiting, unspecified: Secondary | ICD-10-CM

## 2014-06-05 DIAGNOSIS — D649 Anemia, unspecified: Secondary | ICD-10-CM | POA: Diagnosis present

## 2014-06-05 DIAGNOSIS — I200 Unstable angina: Secondary | ICD-10-CM | POA: Diagnosis present

## 2014-06-05 DIAGNOSIS — Z880 Allergy status to penicillin: Secondary | ICD-10-CM | POA: Diagnosis not present

## 2014-06-05 DIAGNOSIS — E1143 Type 2 diabetes mellitus with diabetic autonomic (poly)neuropathy: Secondary | ICD-10-CM | POA: Diagnosis not present

## 2014-06-05 DIAGNOSIS — R112 Nausea with vomiting, unspecified: Secondary | ICD-10-CM | POA: Diagnosis not present

## 2014-06-05 HISTORY — DX: Unstable angina: I20.0

## 2014-06-05 LAB — COMPREHENSIVE METABOLIC PANEL
ALBUMIN: 2.8 g/dL — AB (ref 3.5–5.0)
ALT: 10 U/L — ABNORMAL LOW (ref 14–54)
AST: 10 U/L — AB (ref 15–41)
Alkaline Phosphatase: 119 U/L (ref 38–126)
Anion gap: 6 (ref 5–15)
BUN: 31 mg/dL — ABNORMAL HIGH (ref 6–20)
CHLORIDE: 102 mmol/L (ref 101–111)
CO2: 26 mmol/L (ref 22–32)
CREATININE: 1.71 mg/dL — AB (ref 0.44–1.00)
Calcium: 8.9 mg/dL (ref 8.9–10.3)
GFR calc non Af Amer: 36 mL/min — ABNORMAL LOW (ref >60)
GFR, EST AFRICAN AMERICAN: 41 mL/min — AB (ref >60)
Glucose, Bld: 448 mg/dL — ABNORMAL HIGH (ref 65–99)
Potassium: 5.1 mmol/L (ref 3.5–5.1)
SODIUM: 134 mmol/L — AB (ref 135–145)
TOTAL PROTEIN: 7.4 g/dL (ref 6.5–8.1)
Total Bilirubin: 0.3 mg/dL (ref 0.3–1.2)

## 2014-06-05 LAB — GLUCOSE, CAPILLARY: Glucose-Capillary: 200 mg/dL — ABNORMAL HIGH (ref 65–99)

## 2014-06-05 LAB — URINALYSIS, ROUTINE W REFLEX MICROSCOPIC
BILIRUBIN URINE: NEGATIVE
GLUCOSE, UA: 100 mg/dL — AB
KETONES UR: NEGATIVE mg/dL
LEUKOCYTES UA: NEGATIVE
NITRITE: NEGATIVE
Protein, ur: 30 mg/dL — AB
SPECIFIC GRAVITY, URINE: 1.004 — AB (ref 1.005–1.030)
Urobilinogen, UA: 0.2 mg/dL (ref 0.0–1.0)
pH: 6 (ref 5.0–8.0)

## 2014-06-05 LAB — CBC WITH DIFFERENTIAL/PLATELET
Basophils Absolute: 0 10*3/uL (ref 0.0–0.1)
Basophils Relative: 0 % (ref 0–1)
Eosinophils Absolute: 0.1 10*3/uL (ref 0.0–0.7)
Eosinophils Relative: 1 % (ref 0–5)
HCT: 29.4 % — ABNORMAL LOW (ref 36.0–46.0)
HEMOGLOBIN: 9.2 g/dL — AB (ref 12.0–15.0)
LYMPHS ABS: 2.6 10*3/uL (ref 0.7–4.0)
LYMPHS PCT: 32 % (ref 12–46)
MCH: 24.7 pg — AB (ref 26.0–34.0)
MCHC: 31.3 g/dL (ref 30.0–36.0)
MCV: 79 fL (ref 78.0–100.0)
Monocytes Absolute: 0.2 10*3/uL (ref 0.1–1.0)
Monocytes Relative: 3 % (ref 3–12)
NEUTROS ABS: 5.2 10*3/uL (ref 1.7–7.7)
Neutrophils Relative %: 64 % (ref 43–77)
PLATELETS: 380 10*3/uL (ref 150–400)
RBC: 3.72 MIL/uL — ABNORMAL LOW (ref 3.87–5.11)
RDW: 12.9 % (ref 11.5–15.5)
WBC: 8.1 10*3/uL (ref 4.0–10.5)

## 2014-06-05 LAB — I-STAT TROPONIN, ED: TROPONIN I, POC: 0 ng/mL (ref 0.00–0.08)

## 2014-06-05 LAB — RAPID URINE DRUG SCREEN, HOSP PERFORMED
Amphetamines: NOT DETECTED
Barbiturates: NOT DETECTED
Benzodiazepines: NOT DETECTED
COCAINE: NOT DETECTED
Opiates: NOT DETECTED
Tetrahydrocannabinol: NOT DETECTED

## 2014-06-05 LAB — URINE MICROSCOPIC-ADD ON

## 2014-06-05 LAB — POCT PREGNANCY, URINE: PREG TEST UR: NEGATIVE

## 2014-06-05 LAB — CBG MONITORING, ED: Glucose-Capillary: 277 mg/dL — ABNORMAL HIGH (ref 65–99)

## 2014-06-05 MED ORDER — PROMETHAZINE HCL 25 MG/ML IJ SOLN
12.5000 mg | Freq: Once | INTRAMUSCULAR | Status: AC
  Administered 2014-06-05: 12.5 mg via INTRAVENOUS
  Filled 2014-06-05: qty 1

## 2014-06-05 MED ORDER — INSULIN GLARGINE 100 UNIT/ML SOLOSTAR PEN: 25.0000 [IU] | PEN_INJECTOR | Freq: Every day | SUBCUTANEOUS | Status: DC

## 2014-06-05 MED ORDER — ENOXAPARIN SODIUM 40 MG/0.4ML ~~LOC~~ SOLN
40.0000 mg | SUBCUTANEOUS | Status: DC
  Administered 2014-06-05: 40 mg via SUBCUTANEOUS
  Filled 2014-06-05 (×2): qty 0.4

## 2014-06-05 MED ORDER — METOCLOPRAMIDE HCL 5 MG/ML IJ SOLN
10.0000 mg | Freq: Two times a day (BID) | INTRAMUSCULAR | Status: DC
  Administered 2014-06-05: 10 mg via INTRAVENOUS
  Filled 2014-06-05 (×3): qty 2

## 2014-06-05 MED ORDER — INSULIN GLARGINE 100 UNIT/ML ~~LOC~~ SOLN
25.0000 [IU] | Freq: Every day | SUBCUTANEOUS | Status: DC
  Administered 2014-06-05: 25 [IU] via SUBCUTANEOUS
  Filled 2014-06-05 (×2): qty 0.25

## 2014-06-05 MED ORDER — INSULIN ASPART 100 UNIT/ML ~~LOC~~ SOLN: 0.0000 [IU] | Freq: Every day | SUBCUTANEOUS | Status: DC

## 2014-06-05 MED ORDER — INSULIN ASPART 100 UNIT/ML ~~LOC~~ SOLN
0.0000 [IU] | Freq: Three times a day (TID) | SUBCUTANEOUS | Status: DC
Start: 2014-06-06 — End: 2014-06-06
  Administered 2014-06-06: 3 [IU] via SUBCUTANEOUS
  Administered 2014-06-06: 2 [IU] via SUBCUTANEOUS

## 2014-06-05 MED ORDER — SODIUM CHLORIDE 0.9 % IV SOLN
INTRAVENOUS | Status: AC
  Administered 2014-06-05: 22:00:00 via INTRAVENOUS

## 2014-06-05 MED ORDER — SODIUM CHLORIDE 0.9 % IV BOLUS (SEPSIS)
1000.0000 mL | INTRAVENOUS | Status: AC
  Administered 2014-06-05: 1000 mL via INTRAVENOUS

## 2014-06-05 NOTE — ED Notes (Signed)
CBG 277  

## 2014-06-05 NOTE — ED Provider Notes (Signed)
CSN: [id number]     Arrival date & time 06/05/14  1306 History   First MD Initiated Contact with Patient 06/05/14 1607     Chief Complaint  Patient presents with  . Chest Pain  . Emesis     (Consider location/radiation/quality/duration/timing/severity/associated sxs/prior Treatment) Patient is a 44 y.o. female presenting with chest pain and vomiting. The history is provided by the patient.  Chest Pain Pain location:  Substernal area Pain quality: tightness   Pain radiates to:  Neck Pain radiates to the back: no   Pain severity:  Mild Onset quality:  Gradual Duration:  4 days Timing:  Constant Progression:  Unchanged Chronicity:  New Context: at rest   Relieved by:  Nothing Worsened by:  Nothing tried Ineffective treatments:  None tried Associated symptoms: nausea, shortness of breath and vomiting   Associated symptoms: no abdominal pain, no back pain, no cough, no dizziness, no fatigue, no fever and no headache   Emesis Associated symptoms: no abdominal pain, no diarrhea and no headaches     Past Medical History  Diagnosis Date  . Hidradenitis   . Asthma     prn inhaler  . GERD (gastroesophageal reflux disease)   . Diabetes mellitus     IDDM  . Headache(784.0)     migraines  . DVT (deep venous thrombosis)    Past Surgical History  Procedure Laterality Date  . Tonsillectomy    . Inguinal hidradenitis excision  07/06/2010    bilateral  . Exploratory laparotomy  08/14/2005    lysis of adhesions, drainage of tubo-ovarian abscess  . Breast reduction surgery  2002  . Appendectomy  1995  . Cholecystectomy  1993  . Tubal ligation  1996  . Vaginal hysterectomy  08/04/2005    and cysto  . Hydradenitis excision  02/08/2011    Procedure: EXCISION HYDRADENITIS GROIN;  Surgeon: [name], MD;  Location: [facility];  Service: General;  Laterality: N/A;  Excisioin of Hidradenitis Left groin  . Tummy tuck     Family History  Problem Relation Age of  Onset  . Hypertension Father   . Hypertension Sister   . Hypertension Brother    History  Substance Use Topics  . Smoking status: Current Every Day Smoker -- 0.25 packs/day for 10 years    Types: Cigarettes  . Smokeless tobacco: Never Used     Comment: 1 pack/week  . Alcohol Use: No   OB History    No data available     Review of Systems  Constitutional: Negative for fever and fatigue.  HENT: Negative for congestion and drooling.   Eyes: Negative for pain.  Respiratory: Positive for shortness of breath. Negative for cough.   Cardiovascular: Positive for chest pain.  Gastrointestinal: Positive for nausea and vomiting. Negative for abdominal pain and diarrhea.  Genitourinary: Negative for dysuria and hematuria.  Musculoskeletal: Negative for back pain, gait problem and neck pain.  Skin: Negative for color change.  Neurological: Negative for dizziness and headaches.  Hematological: Negative for adenopathy.  Psychiatric/Behavioral: Negative for behavioral problems.  All other systems reviewed and are negative.     Allergies  Penicillins  Home Medications   Prior to Admission medications   Medication Sig Start Date End Date Taking? Authorizing Provider  albuterol (PROVENTIL HFA;VENTOLIN HFA) 108 (90 BASE) MCG/ACT inhaler Inhale 1-2 puffs into the lungs every 6 (six) hours as needed for wheezing. Patient not taking: Reported on 06/04/2014 12/17/11   Randa Spike, MD  insulin  aspart (NOVOLOG FLEXPEN) 100 UNIT/ML FlexPen Inject 5-20 Units into the skin 3 (three) times daily with meals. 08/22/13   Philemon Kingdom, MD  Insulin Glargine (LANTUS) 100 UNIT/ML Solostar Pen Inject 25 Units into the skin 2 (two) times daily. 08/22/13   Philemon Kingdom, MD  lisinopril (PRINIVIL,ZESTRIL) 10 MG tablet Take 10 mg by mouth daily.    Historical Provider, MD  methocarbamol (ROBAXIN) 500 MG tablet Take 500 mg by mouth every 6 (six) hours as needed for muscle spasms. 06/20/13   Hannah  Muthersbaugh, PA-C  ondansetron (ZOFRAN) 4 MG tablet Take 4 mg by mouth every 8 (eight) hours as needed for nausea or vomiting.    Historical Provider, MD  Rivaroxaban (XARELTO STARTER PACK) 15 & 20 MG TBPK Take 15-20 mg by mouth as directed. Take as directed on package: Start with one 15mg  tablet by mouth twice a day with food. On Day 22, switch to one 20mg  tablet once a day with food. 10/26/13   Marissa Sciacca, PA-C   BP 143/82 mmHg  Pulse 98  Temp(Src) 98.1 F (36.7 C) (Oral)  Resp 16  SpO2 100% Physical Exam  Constitutional: She is oriented to person, place, and time. She appears well-developed and well-nourished.  HENT:  Head: Normocephalic.  Mouth/Throat: Oropharynx is clear and moist. No oropharyngeal exudate.  Eyes: Conjunctivae and EOM are normal. Pupils are equal, round, and reactive to light.  Neck: Normal range of motion. Neck supple.  Cardiovascular: Normal rate, regular rhythm, normal heart sounds and intact distal pulses.  Exam reveals no gallop and no friction rub.   No murmur heard. Pulmonary/Chest: Effort normal and breath sounds normal. No respiratory distress. She has no wheezes.  Abdominal: Soft. Bowel sounds are normal. There is no tenderness. There is no rebound and no guarding.  Musculoskeletal: Normal range of motion. She exhibits no edema or tenderness.  Neurological: She is alert and oriented to person, place, and time.  Skin: Skin is warm and dry.  Psychiatric: She has a normal mood and affect. Her behavior is normal.  Nursing note and vitals reviewed.   ED Course  Procedures (including critical care time) Labs Review Labs Reviewed  CBC WITH DIFFERENTIAL/PLATELET - Abnormal; Notable for the following:    RBC 3.72 (*)    Hemoglobin 9.2 (*)    HCT 29.4 (*)    MCH 24.7 (*)    All other components within normal limits  COMPREHENSIVE METABOLIC PANEL - Abnormal; Notable for the following:    Sodium 134 (*)    Glucose, Bld 448 (*)    BUN 31 (*)     Creatinine, Ser 1.71 (*)    Albumin 2.8 (*)    AST 10 (*)    ALT 10 (*)    GFR calc non Af Amer 36 (*)    GFR calc Af Amer 41 (*)    All other components within normal limits  URINALYSIS, ROUTINE W REFLEX MICROSCOPIC (NOT AT [facility])  Randolm Idol, ED  POC URINE PREG, ED    Imaging Review Dg Chest 2 View  06/05/2014   CLINICAL DATA:  Shortness of breath and left upper chest pain with emesis 3 days.  EXAM: CHEST  2 VIEW  COMPARISON:  06/04/2014  FINDINGS: The heart size and mediastinal contours are within normal limits. Both lungs are clear. The visualized skeletal structures are unremarkable.  IMPRESSION: No active cardiopulmonary disease.   Electronically Signed   By: Marin Olp M.D.   On: 06/05/2014 14:45  Dg Chest 2 View  06/04/2014   CLINICAL DATA:  Anterior chest pain and shortness of Breath  EXAM: CHEST  2 VIEW  COMPARISON:  10/29/2013  FINDINGS: The heart size and mediastinal contours are within normal limits. Both lungs are clear. The visualized skeletal structures are unremarkable.  IMPRESSION: No active cardiopulmonary disease.   Electronically Signed   By: Inez Catalina M.D.   On: 06/04/2014 18:52     EKG Interpretation None      MDM   Final diagnoses:  Intractable vomiting with nausea, vomiting of unspecified type  Hyperglycemia  Other chest pain    4:44 PM 44 y.o. female w hx of asthma, DM, DVT (anti-coag since d/c by doctor) who presents with chest pain, nausea, vomiting. She states that she has had daily nausea and vomiting over the last 2 months which has worsened in the last 2-3 days. She also notes the development of chest pain over the last 3-4 days. She describes a constant tightness and pressure over the last 2 days. Was seen here yesterday with a noncontributory workup. The patient returns today due to intractable nausea and vomiting. Screening labs and imaging performed.  EKG interpreted and reviewed by me. Will not transfer to note.  We'll admit to the  family medicine service (unassigned pt).     Pamella Pert, MD 06/05/14 [phone]

## 2014-06-05 NOTE — ED Notes (Addendum)
Report attempted 

## 2014-06-05 NOTE — ED Notes (Signed)
Pt c/o CP and vomiting; pt seen here last night for same; pain goes into left side of neck

## 2014-06-05 NOTE — ED Notes (Signed)
Pt informed, urine needed to be sent to lab for test. Pt verbalize understanding.

## 2014-06-05 NOTE — H&P (Signed)
Date: 06/05/2014               Patient Name:  Susan Horton MRN: [id number]  DOB: [birth year] Age / Sex: 44 y.o., female   PCP: Claris Gladden. Ouida Sills, [place]         Medical Service: Internal Medicine Teaching Service         Attending Physician: Dr. Beryle Beams    First Contact: Dr. Arcelia Jew Pager: [phone]  Second Contact: Dr. Heber Wentworth  Pager: [phone]       After Hours (After 5p/  First Contact Pager: [phone]  weekends / holidays): Second Contact Pager: [phone]   Chief Complaint: Nausea and vomiting   History of Present Illness: Ms. Frazzini is a 44yo woman with PMHx of asthma, uncontrolled diabetes (last HbA1c 11.6 in Aug 2015, currently being worked up for type 1 vs type 2), GERD, and DVT diagnosed in Oct 2015 on Xarelto who presents to the ED with intractable nausea and vomiting. Patient reports she has had nausea and vomiting for the past 2 months. She was seen in the ED yesterday and recommended to continue conservative treatment, including zofran and gradually advance diet. She presented again today stating that she vomited 8 times today. She describes vomiting daily and at least 5 times per day with progressive worsening. She denies any blood in the emesis. She states she is able to eat, but then the food will come back up after 30 minutes or so. She denies any fevers, chills, abdominal pain, diarrhea, and constipation. She denies any weight loss despite not being able to keep food down. She denies any marijuana use. She also reports left-sided chest pain that started a few days ago. She describes the chest pain as sharp, intermittent at first and now constant, currently a 6/10 and 9/10 at its worst, and non-radiating. She reports associated dyspnea.   In the ED, her troponin was negative x 1 and EKG unremarkable for ischemic changes.   Meds: Current Facility-Administered Medications  Medication Dose Route Frequency Provider Last Rate Last Dose  . sodium chloride 0.9 % bolus 1,000 mL  1,000 mL  Intravenous STAT Pamella Pert, MD 1,000 mL/hr at 06/05/14 1705 1,000 mL at 06/05/14 1705   Current Outpatient Prescriptions  Medication Sig Dispense Refill  . albuterol (PROVENTIL HFA;VENTOLIN HFA) 108 (90 BASE) MCG/ACT inhaler Inhale 1-2 puffs into the lungs every 6 (six) hours as needed for wheezing. (Patient not taking: Reported on 06/04/2014) 1 Inhaler 0  . insulin aspart (NOVOLOG FLEXPEN) 100 UNIT/ML FlexPen Inject 5-20 Units into the skin 3 (three) times daily with meals. 15 mL 3  . Insulin Glargine (LANTUS) 100 UNIT/ML Solostar Pen Inject 25 Units into the skin 2 (two) times daily.    [name] lisinopril (PRINIVIL,ZESTRIL) 10 MG tablet Take 10 mg by mouth daily.    . methocarbamol (ROBAXIN) 500 MG tablet Take 500 mg by mouth every 6 (six) hours as needed for muscle spasms.    . ondansetron (ZOFRAN) 4 MG tablet Take 4 mg by mouth every 8 (eight) hours as needed for nausea or vomiting.    . Rivaroxaban (XARELTO STARTER PACK) 15 & 20 MG TBPK Take 15-20 mg by mouth as directed. Take as directed on package: Start with one 15mg  tablet by mouth twice a day with food. On Day 22, switch to one 20mg  tablet once a day with food.      Allergies: Allergies as of 06/05/2014 - Review Complete 06/05/2014  Allergen Reaction Noted  . Penicillins Swelling  and Rash 06/09/2010   Past Medical History  Diagnosis Date  . Hidradenitis   . Asthma     prn inhaler  . GERD (gastroesophageal reflux disease)   . Diabetes mellitus     IDDM  . Headache(784.0)     migraines  . DVT (deep venous thrombosis)    Past Surgical History  Procedure Laterality Date  . Tonsillectomy    . Inguinal hidradenitis excision  07/06/2010    bilateral  . Exploratory laparotomy  08/14/2005    lysis of adhesions, drainage of tubo-ovarian abscess  . Breast reduction surgery  2002  . Appendectomy  1995  . Cholecystectomy  1993  . Tubal ligation  1996  . Vaginal hysterectomy  08/04/2005    and cysto  . Hydradenitis excision   02/08/2011    Procedure: EXCISION HYDRADENITIS GROIN;  Surgeon: [name], MD;  Location: [facility];  Service: General;  Laterality: N/A;  Excisioin of Hidradenitis Left groin  . Tummy tuck     Family History  Problem Relation Age of Onset  . Hypertension Father   . Hypertension Sister   . Hypertension Brother    History   Social History  . Marital Status: Single    Spouse Name: N/A  . Number of Children: N/A  . Years of Education: N/A   Occupational History  . Corrections officer    Social History Main Topics  . Smoking status: Current Every Day Smoker -- 0.50 packs/day for 20 years    Types: Cigarettes  . Smokeless tobacco: Never Used     Comment: 1 pack/week  . Alcohol Use: No  . Drug Use: No  . Sexual Activity: Not on file   Other Topics Concern  . Not on file   Social History Narrative    Review of Systems: General: Denies night sweats HEENT: Denies headaches, ear pain, changes in vision, rhinorrhea, sore throat CV: Denies palpitations, orthopnea Pulm: Denies cough, wheezing GI: Denies melena, hematochezia GU: Denies dysuria, hematuria, frequency Msk: Reports left sided neck/jaw pain when turning her head. Denies muscle cramps, joint pains Neuro: Denies weakness, numbness, tingling Skin: Denies rashes, bruising  Physical Exam: Blood pressure 164/77, pulse 87, temperature 98.1 F (36.7 C), temperature source Oral, resp. rate 15, SpO2 98 %. General: middle aged woman sitting up in bed, NAD HEENT: [place]/AT, EOMI, sclera anicteric, pharynx non-erythematous, mucus membranes dry Neck: supple, no carotid bruits. Patient notes pain when turning head to right.  CV: RRR, no m/g/r Pulm: CTA bilaterally, breaths non-labored Abd: BS+, soft, non-tender, non-distended Ext: warm, no edema. No calf tenderness.  Neuro: alert and oriented x 3, no focal deficits  Lab results: Basic Metabolic Panel:  Recent Labs  06/04/14 1813 06/05/14 1401  NA  136 134*  K 4.7 5.1  CL 104 102  CO2 24 26  GLUCOSE 255* 448*  BUN 34* 31*  CREATININE 1.89* 1.71*  CALCIUM 9.2 8.9   Liver Function Tests:  Recent Labs  06/04/14 1813 06/05/14 1401  AST 10* 10*  ALT 12* 10*  ALKPHOS 130* 119  BILITOT 0.1* 0.3  PROT 7.4 7.4  ALBUMIN 2.9* 2.8*   CBC:  Recent Labs  06/04/14 1813 06/05/14 1401  WBC 9.5 8.1  NEUTROABS 5.8 5.2  HGB 9.6* 9.2*  HCT 30.3* 29.4*  MCV 78.7 79.0  PLT 438* 380   CBG:  Recent Labs  06/04/14 1806  GLUCAP 228*    Imaging results:  Dg Chest 2 View  06/05/2014  CLINICAL DATA:  Shortness of breath and left upper chest pain with emesis 3 days.  EXAM: CHEST  2 VIEW  COMPARISON:  06/04/2014  FINDINGS: The heart size and mediastinal contours are within normal limits. Both lungs are clear. The visualized skeletal structures are unremarkable.  IMPRESSION: No active cardiopulmonary disease.   Electronically Signed   By: Marin Olp M.D.   On: 06/05/2014 14:45   Dg Chest 2 View  06/04/2014   CLINICAL DATA:  Anterior chest pain and shortness of Breath  EXAM: CHEST  2 VIEW  COMPARISON:  10/29/2013  FINDINGS: The heart size and mediastinal contours are within normal limits. Both lungs are clear. The visualized skeletal structures are unremarkable.  IMPRESSION: No active cardiopulmonary disease.   Electronically Signed   By: Inez Catalina M.D.   On: 06/04/2014 18:52    Other results: EKG: sinus rhythm, no significant change from prior EKG  Assessment & Plan by Problem:  Intractable Nausea and Vomiting: Patient presented with a 2 month history of progressively worsening nausea and vomiting. Diabetic gastroparesis is in the differential as she has a history of uncontrolled diabetes (last HbA1c 11.6) and vomiting occurs 30 mins or more after eating. She denied marijuana use, but another potential diagnosis is cannabinoid hyperemesis syndrome. She is not on any medications that would cause intractable vomiting.  - Consider  gastric emptying study in AM - Check UDS - Start Reglan if pregnancy test neg - Check HIV - IVF @ 100  Chest Pain: Patient presented with left-sided chest pain for past few days. Troponins neg x 2 within 12 hours. EKG without ischemic changes. Likely related to intractable nausea/vomiting.  - Continue to monitor   AKI: Cr 1.71 with elevated BUN 31 on admission indicating a prerenal etiology. Baseline Cr 0.8-0.9. This is consistent with patient's history and due to volume depletion from vomiting.  - NS @ 100 - bmet in AM  Microcytic Anemia: Hbg 9.2, MCV 79 on admission. Baseline appears 11-12, but has been in lower range 9-10 for past 8 months. Patient denies any active bleeding. - Check anemia panel - CBC in AM  Hypoalbuminemia: Albumin 2.8 on admission. Will check UA to assess for proteinuria. She has had proteinuria in past likely due to uncontrolled diabetes.  - Check UA  Hyponatremia: Na 134 on admission. Likely secondary to volume depletion from vomiting.  - Continue to monitor - bmet in AM - NS @ 100  Insulin- dependent Diabetes: Last HbA1c 11.6 in August 2015. Patient follows with Dr. Cruzita Lederer but appears to have missed appointments. Dr. Cruzita Lederer is currently working up patient for type 1 vs type 2 diabetes since she was diagnosed at age 58, but has not been able to order labs due to the patient's persistent hyperglycemia as well as missing appointments. Patient states she takes Lantus 25 units daily and is on a Novolog sliding scale. Per Dr. Arman Filter last note, patient should be on Lantus 25 units BID, Novolog 5 units breakfast, 15 units lunch and dinner, and a Novolog sliding scale for correction.  - Will start with Lantus 25 units daily, increase as necessary - May need to add mealtime Novolog  - Novolog ISS - CBGs 4 times daily - Check anti-islet cell and anti-GAD antibodies   Asthma: Patient uses an albuterol inhaler at home. Currently without symptoms. - Continue to  monitor   GERD: Reported history of GERD but is not on medication at home.  - Continue to monitor   Hx  of DVTs: Patient with hx of DVT greater than 20 years ago while she was pregnant. She most recently had another LLE DVT in October 2015 for which she was started on Xarelto. Per patient, her Xarelto therapy was stopped but timing unclear. Do not suspect patient has another DVT as she has no clinical signs of DVT. Her chest pain is not pleuritic and she does not have tachycardia.    Diet: NPO VTE PPx: Lovenox SQ Dispo: Disposition is deferred at this time, awaiting improvement of current medical problems. Anticipated discharge in approximately 1-2 day(s).   The patient does have a current PCP (Takela N. Ouida Sills, [place]) and does need an [facility] hospital follow-up appointment after discharge.  The patient does not have transportation limitations that hinder transportation to clinic appointments.  Signed: Juliet Rude, MD 06/05/2014, 5:29 PM

## 2014-06-05 NOTE — ED Notes (Signed)
Report attempted 

## 2014-06-06 ENCOUNTER — Encounter (HOSPITAL_COMMUNITY): Payer: Self-pay | Admitting: General Practice

## 2014-06-06 DIAGNOSIS — Z794 Long term (current) use of insulin: Secondary | ICD-10-CM | POA: Diagnosis not present

## 2014-06-06 DIAGNOSIS — E1143 Type 2 diabetes mellitus with diabetic autonomic (poly)neuropathy: Secondary | ICD-10-CM

## 2014-06-06 DIAGNOSIS — K3184 Gastroparesis: Secondary | ICD-10-CM | POA: Insufficient documentation

## 2014-06-06 LAB — BASIC METABOLIC PANEL
Anion gap: 5 (ref 5–15)
BUN: 25 mg/dL — AB (ref 6–20)
CO2: 24 mmol/L (ref 22–32)
CREATININE: 1.37 mg/dL — AB (ref 0.44–1.00)
Calcium: 8.4 mg/dL — ABNORMAL LOW (ref 8.9–10.3)
Chloride: 109 mmol/L (ref 101–111)
GFR calc Af Amer: 54 mL/min — ABNORMAL LOW (ref >60)
GFR, EST NON AFRICAN AMERICAN: 46 mL/min — AB (ref >60)
Glucose, Bld: 174 mg/dL — ABNORMAL HIGH (ref 65–99)
POTASSIUM: 4.9 mmol/L (ref 3.5–5.1)
Sodium: 138 mmol/L (ref 135–145)

## 2014-06-06 LAB — IRON AND TIBC
IRON: 33 ug/dL (ref 28–170)
Saturation Ratios: 16 % (ref 10.4–31.8)
TIBC: 203 ug/dL — ABNORMAL LOW (ref 250–450)
UIBC: 170 ug/dL

## 2014-06-06 LAB — GLUCOSE, CAPILLARY
GLUCOSE-CAPILLARY: 134 mg/dL — AB (ref 65–99)
Glucose-Capillary: 176 mg/dL — ABNORMAL HIGH (ref 65–99)

## 2014-06-06 LAB — FOLATE: Folate: 8.7 ng/mL (ref >5.9)

## 2014-06-06 LAB — FERRITIN: Ferritin: 285 ng/mL (ref 11–307)

## 2014-06-06 LAB — HIV ANTIBODY (ROUTINE TESTING W REFLEX): HIV Screen 4th Generation wRfx: NONREACTIVE

## 2014-06-06 LAB — RETICULOCYTES
RBC.: 3.6 MIL/uL — ABNORMAL LOW (ref 3.87–5.11)
Retic Count, Absolute: 46.8 10*3/uL (ref 19.0–186.0)
Retic Ct Pct: 1.3 % (ref 0.4–3.1)

## 2014-06-06 LAB — VITAMIN B12: Vitamin B-12: 655 pg/mL (ref 180–914)

## 2014-06-06 MED ORDER — METOCLOPRAMIDE HCL 5 MG PO TABS
5.0000 mg | ORAL_TABLET | Freq: Three times a day (TID) | ORAL | Status: DC
  Administered 2014-06-06: 5 mg via ORAL
  Filled 2014-06-06 (×3): qty 1

## 2014-06-06 MED ORDER — METOCLOPRAMIDE HCL 5 MG PO TABS
5.0000 mg | ORAL_TABLET | Freq: Three times a day (TID) | ORAL | Status: DC
  Filled 2014-06-06 (×3): qty 1

## 2014-06-06 MED ORDER — LIVING WELL WITH DIABETES BOOK
Freq: Once | Status: AC
  Administered 2014-06-06: 08:00:00
  Filled 2014-06-06: qty 1

## 2014-06-06 MED ORDER — SODIUM CHLORIDE 0.9 % IV SOLN
INTRAVENOUS | Status: DC
  Administered 2014-06-06: 12:00:00 via INTRAVENOUS

## 2014-06-06 MED ORDER — INSULIN GLARGINE 100 UNIT/ML SOLOSTAR PEN: 25.0000 [IU] | PEN_INJECTOR | Freq: Every day | SUBCUTANEOUS | Status: DC

## 2014-06-06 MED ORDER — METOCLOPRAMIDE HCL 5 MG PO TABS: 5.0000 mg | ORAL_TABLET | Freq: Three times a day (TID) | ORAL | Status: DC

## 2014-06-06 NOTE — Progress Notes (Signed)
Subjective: No acute events overnight. Patient reports one episodes of vomiting this morning. She still notes some nausea. She reports her chest pain is better. She denies any abdominal pain.   Objective: Vital signs in last 24 hours: Filed Vitals:   06/05/14 1900 06/05/14 1943 06/05/14 2023 06/06/14 0703  BP: 144/84  145/68 155/85  Pulse: 82  78 77  Temp: 98.2 F (36.8 C)  98.3 F (36.8 C) 98.1 F (36.7 C)  TempSrc: Oral  Oral Oral  Resp: 17  16 15   Height:  5\' 5"  (1.651 m)    Weight:  169 lb (76.658 kg)    SpO2: 100%  100% 100%   Weight change:   Intake/Output Summary (Last 24 hours) at 06/06/14 0856 Last data filed at 06/06/14 0223  Gross per 24 hour  Intake      0 ml  Output    200 ml  Net   -200 ml   Physical Exam General: middle aged woman sitting up in bed, NAD HEENT: [place]/AT, EOMI, sclera anicteric, pharynx non-erythematous, mucus membranes dry CV: RRR, no m/g/r Pulm: CTA bilaterally, breaths non-labored Abd: BS+, soft, non-tender, non-distended Ext: warm, no edema Neuro: alert and oriented x 3, no focal deficits  Lab Results: Basic Metabolic Panel:  Recent Labs Lab 06/05/14 1401 06/06/14 0533  NA 134* 138  K 5.1 4.9  CL 102 109  CO2 26 24  GLUCOSE 448* 174*  BUN 31* 25*  CREATININE 1.71* 1.37*  CALCIUM 8.9 8.4*   Liver Function Tests:  Recent Labs Lab 06/04/14 1813 06/05/14 1401  AST 10* 10*  ALT 12* 10*  ALKPHOS 130* 119  BILITOT 0.1* 0.3  PROT 7.4 7.4  ALBUMIN 2.9* 2.8*   CBC:  Recent Labs Lab 06/04/14 1813 06/05/14 1401  WBC 9.5 8.1  NEUTROABS 5.8 5.2  HGB 9.6* 9.2*  HCT 30.3* 29.4*  MCV 78.7 79.0  PLT 438* 380   CBG:  Recent Labs Lab 06/04/14 1806 06/05/14 1839 06/05/14 2112 06/06/14 0802  GLUCAP 228* 277* 200* 134*   Anemia Panel:  Recent Labs Lab 06/06/14 0533  VITAMINB12 655  FOLATE 8.7  FERRITIN 285  TIBC 203*  IRON 33  RETICCTPCT 1.3   Urine Drug Screen: Drugs of Abuse     Component Value  Date/Time   LABOPIA NONE DETECTED 06/05/2014 1900   COCAINSCRNUR NONE DETECTED 06/05/2014 1900   LABBENZ NONE DETECTED 06/05/2014 1900   AMPHETMU NONE DETECTED 06/05/2014 1900   THCU NONE DETECTED 06/05/2014 1900   LABBARB NONE DETECTED 06/05/2014 1900    Urinalysis:  Recent Labs Lab 06/05/14 1900  COLORURINE STRAW*  LABSPEC 1.004*  PHURINE 6.0  GLUCOSEU 100*  HGBUR SMALL*  BILIRUBINUR NEGATIVE  KETONESUR NEGATIVE  PROTEINUR 30*  UROBILINOGEN 0.2  NITRITE NEGATIVE  LEUKOCYTESUR NEGATIVE   Studies/Results: Dg Chest 2 View  06/05/2014   CLINICAL DATA:  Shortness of breath and left upper chest pain with emesis 3 days.  EXAM: CHEST  2 VIEW  COMPARISON:  06/04/2014  FINDINGS: The heart size and mediastinal contours are within normal limits. Both lungs are clear. The visualized skeletal structures are unremarkable.  IMPRESSION: No active cardiopulmonary disease.   Electronically Signed   By: Marin Olp M.D.   On: 06/05/2014 14:45   Dg Chest 2 View  06/04/2014   CLINICAL DATA:  Anterior chest pain and shortness of Breath  EXAM: CHEST  2 VIEW  COMPARISON:  10/29/2013  FINDINGS: The heart size and mediastinal contours are within  normal limits. Both lungs are clear. The visualized skeletal structures are unremarkable.  IMPRESSION: No active cardiopulmonary disease.   Electronically Signed   By: Inez Catalina M.D.   On: 06/04/2014 18:52   Medications: I have reviewed the patient's current medications.  Assessment/Plan:  Intractable Nausea and Vomiting Likely Secondary to Diabetic Gastroparesis: Patient with history of uncontrolled diabetes and hx of food coming back up after at least 30 minutes most consistent with diabetic gastroparesis. Her THC was negative on UDS so cannabinoid hyperemesis syndrome not likely. Will switch over to oral Reglan. Ultimately, control of her diabetes will be the most important factor for treating her gastroparesis.  - Discharge home today - Follow up with  Dr. Cruzita Lederer tomorrow  - Switch to oral Reglan 5 mg TID - Check HIV>> pending   Chest Pain: Now resolved. Troponins neg x 2 within 12 hours. EKG without ischemic changes. Likely related to intractable nausea/vomiting.   AKI: Cr 1.71 with elevated BUN 31 on admission indicating a prerenal etiology. Baseline Cr 0.8-0.9. Cr improved to 1.37 this morning after IVFs. Due to volume depletion from vomiting.  - Needs repeat bmet at follow up visit   Microcytic Anemia: Hbg 9.2, MCV 79 on admission. Baseline appears 11-12, but has been in lower range 9-10 for past 8 months. Patient denies any active bleeding. Anemia panel shows normal iron (33), low TIBC (203), normal ferritin (285), normal vitamin B12 and folate. Retics 1.3%. With low-normal iron and low TIBC could be anemia of chronic disease vs thalassemia.   Hypoalbuminemia: Albumin 2.8 on admission. UA with small amount of protein. Patient is on Lisinopril at home.   Hyponatremia-Resolved: Na 134 on admission, now 138 this AM. Likely secondary to volume depletion from vomiting.   Insulin- dependent Diabetes: Last HbA1c 11.6 in August 2015. Patient follows with Dr. Cruzita Lederer but appears to have missed appointments. Dr. Cruzita Lederer is currently working up patient for type 1 vs type 2 diabetes since she was diagnosed at age 6, but has not been able to order labs due to the patient's persistent hyperglycemia as well as missing appointments. Patient states she takes Lantus 25 units daily and is on a Novolog sliding scale. Per Dr. Arman Filter last note, patient should be on Lantus 25 units BID, Novolog 5 units breakfast, 15 units lunch and dinner, and a Novolog sliding scale for correction.  - Will discharge on Lantus 25 units daily since blood sugars well controlled - Patient has follow up with Dr. Cruzita Lederer tomorrow  - Novolog ISS - CBGs 4 times daily - Check anti-islet cell and anti-GAD antibodies >> pending   Asthma: Patient uses an albuterol inhaler at  home. Currently without symptoms.  GERD: Reported history of GERD but is not on medication at home.   Hx of DVTs: Patient with hx of DVT greater than 20 years ago while she was pregnant. She most recently had another LLE DVT in October 2015 for which she was started on Xarelto. Per patient, her Xarelto therapy was stopped but timing unclear. No suspicion for DVT or PE at this time.   Diet: Clear liquids  VTE PPx: Lovenox SQ Dispo: Discharge today with close follow up with endocrinologist, Dr. Cruzita Lederer  The patient does have a current PCP (Takela N. Ouida Sills, [place]) and does need an [facility] hospital follow-up appointment after discharge.  The patient does not have transportation limitations that hinder transportation to clinic appointments.  .Services Needed at time of discharge: Y = Yes, Blank = No PT:  OT:   RN:   Equipment:   Other:       Juliet Rude, MD 06/06/2014, 8:56 AM

## 2014-06-06 NOTE — Discharge Summary (Signed)
Name: Susan Horton MRN: [id number] DOB: 1970-12-16 44 y.o. PCP: Susan Horton, Susan Horton  Date of Admission: 06/05/2014  4:04 PM Date of Discharge: 06/06/2014 Attending Physician: Annia Belt, MD  Discharge Diagnosis: Principal Problem Intractable Nausea and Vomiting Likely 2/2 Diabetic Gastroparesis Active Problems Atypical Chest Pain AKI Insulin Dependent Diabetes  HTN Microcytic Anemia Hypoalbuminemia Asthma GERD Hx DVTs  Discharge Medications:   Medication List    STOP taking these medications        XARELTO STARTER PACK 15 & 20 MG Tbpk  Generic drug:  Rivaroxaban      TAKE these medications        insulin aspart 100 UNIT/ML FlexPen  Commonly known as:  NOVOLOG FLEXPEN  Inject 5-20 Units into the skin 3 (three) times daily with meals.     Insulin Glargine 100 UNIT/ML Solostar Pen  Commonly known as:  LANTUS  Inject 25 Units into the skin daily at 10 pm.     lisinopril 10 MG tablet  Commonly known as:  PRINIVIL,ZESTRIL  Take 10 mg by mouth daily.     metoCLOPramide 5 MG tablet  Commonly known as:  REGLAN  Take 1 tablet (5 mg total) by mouth 3 (three) times daily before meals.        Disposition and follow-up:   Susan Horton was discharged from [facility] in Good condition.  At the hospital follow up visit please address:  1.  DM/Gastroparesis- Is Reglan helping N/V? Please adjust insulin regimen as necessary. Patient currently taking Lantus 25 units daily and Novolog ISS at home. AKI- Cr improved to 1.3 by time of discharge. Due to volume depletion from vomiting. Will need a repeat bmet.   2.  Labs / imaging needed at time of follow-up: bmet   3.  Pending labs/ test needing follow-up: HIV, anti-GAD and anti-islet cell antibodies   Follow-up Appointments: Follow-up Information    Follow up with Susan Kingdom, MD.   Specialty:  Internal Medicine   Why:  Appointment on May 27th at 8:00 AM    Contact information:   [address] [place] [id number] [phone]       Discharge Instructions: Discharge Instructions    Diet - low sodium heart healthy    Complete by:  As directed      Increase activity slowly    Complete by:  As directed            Consultations:    Procedures Performed:  Dg Chest 2 View  06/05/2014   CLINICAL DATA:  Shortness of breath and left upper chest pain with emesis 3 days.  EXAM: CHEST  2 VIEW  COMPARISON:  06/04/2014  FINDINGS: The heart size and mediastinal contours are within normal limits. Both lungs are clear. The visualized skeletal structures are unremarkable.  IMPRESSION: No active cardiopulmonary disease.   Electronically Signed   By: Susan Horton M.D.   On: 06/05/2014 14:45   Dg Chest 2 View  06/04/2014   CLINICAL DATA:  Anterior chest pain and shortness of Breath  EXAM: CHEST  2 VIEW  COMPARISON:  10/29/2013  FINDINGS: The heart size and mediastinal contours are within normal limits. Both lungs are clear. The visualized skeletal structures are unremarkable.  IMPRESSION: No active cardiopulmonary disease.   Electronically Signed   By: Susan Horton M.D.   On: 06/04/2014 18:52    Admission HPI: Susan Horton is a 44yo woman with PMHx of asthma,  uncontrolled diabetes (last HbA1c 11.6 in Aug 2015, currently being worked up for type 1 vs type 2), GERD, and DVT diagnosed in Oct 2015 on Xarelto who presents to the ED with intractable nausea and vomiting. Patient reports she has had nausea and vomiting for the past 2 months. She was seen in the ED yesterday and recommended to continue conservative treatment, including zofran and gradually advance diet. She presented again today stating that she vomited 8 times today. She describes vomiting daily and at least 5 times per day with progressive worsening. She denies any blood in the emesis. She states she is able to eat, but then the food will come back up after 30 minutes or so. She denies any fevers, chills,  abdominal pain, diarrhea, and constipation. She denies any weight loss despite not being able to keep food down. She denies any marijuana use. She also reports left-sided chest pain that started a few days ago. She describes the chest pain as sharp, intermittent at first and now constant, currently a 6/10 and 9/10 at its worst, and non-radiating. She reports associated dyspnea.   In the ED, her troponin was negative x 1 and EKG unremarkable for ischemic changes.    Hospital Course by problem list:  Intractable Nausea and Vomiting Likely 2/2 Diabetic Gastroparesis: Patient presented with a 2 month history of progressively worsening nausea and vomiting in the setting of uncontrolled diabetes. Given her history that food comes back up after at least 30 minutes this seems most consistent with diabetic gastroparesis. She was initially started on Reglan 10 mg IV and then switched to 5 mg PO TID. Her vomiting had decreased significantly by time of discharge. She will have follow up with Susan Horton (endocrinology) on 5/27 as diabetes control is an important aspect of treating her gastroparesis.   Atypical Chest Pain: Patient presented with left-sided, sharp, non-radiating chest pain that had been occuring for several days. Her troponins were negative over the past 2 days as she had gone to the ED the day prior to admission. Her EKG was unremarkable for ischemic changes. Her chest pain is likely related to her nausea, vomiting, and gastroparesis.   AKI: Cr 1.71 with elevated BUN 31 on admission indicating a prerenal etiology. Baseline Cr 0.8-0.9. Cr improved to 1.37 after IVF administration. Her AKI was attributed to volume depletion from vomiting. She will need a repeat bmet at her outpatient appointment.   Insulin Dependent Diabetes: Last HbA1c 11.6 in August 2015. Patient follows with Susan Horton but appears to have missed appointments. Susan Horton is currently working up patient for type 1 vs type 2  diabetes since she was diagnosed at age 64, but has not been able to order labs due to the patient's persistent hyperglycemia as well as missing appointments. Patient stated she takes Lantus 25 units daily and is on a Novolog sliding scale. Per Dr. Arman Filter last note, patient should be on Lantus 25 units BID, Novolog 5 units breakfast, 15 units lunch and dinner, and a Novolog sliding scale for correction. She was placed on Lantus 25 units daily and a Novolog sliding scale while in the hospital and her blood sugars remained fairly controlled in the 130s-220s. During her hospitalization, anti-GAD and anti-islet cell antibodies were ordered. These will need to be followed up. She was discharged on Lantus 25 units daily and her Novolog sliding scale. She has an appointment with Susan Horton on 5/27.   HTN: BP 150/70 on admission. BP remained stable in [id number] systolic.  She was discharged on her home Lisinopril.   Microcytic Anemia: Hbg 9.2, MCV 79 on admission. Baseline appeared to be 11-12, but has been in lower range 9-10 for past 8 months. Patient denied any active bleeding. Anemia panel showed normal iron (33), low TIBC (203), normal ferritin (285), normal vitamin B12 and folate. Retics 1.3%. With low-normal iron and low TIBC could be anemia of chronic disease vs thalassemia. Patient is asymptomatic.   Hypoalbuminemia: Albumin 2.8 on admission. UA with small amount of protein. Patient is on Lisinopril at home. Could also be related to poor appetite due to nausea/vomiting for past 2 months.   Asthma: Patient remained asymptomatic. She was discharged on her home albuterol inhaler.   Hx DVTs: Patient with hx of DVT greater than 20 years ago while she was pregnant. She most recently had another LLE DVT in October 2015 for which she was started on Xarelto. Per patient, her Xarelto therapy was stopped but timing unclear. There was no suspicion for DVT or PE as she had no clinical signs on exam.   Discharge  Vitals:   BP 155/85 mmHg  Pulse 77  Temp(Src) 98.1 F (36.7 C) (Oral)  Resp 15  Ht 5\' 5"  (1.651 m)  Wt 169 lb (76.658 kg)  BMI 28.12 kg/m2  SpO2 100% Physical Exam General: middle aged woman sitting up in bed, NAD HEENT: [place]/AT, EOMI, sclera anicteric, pharynx non-erythematous, mucus membranes dry CV: RRR, no m/g/r Pulm: CTA bilaterally, breaths non-labored Abd: BS+, soft, non-tender, non-distended Ext: warm, no edema Neuro: alert and oriented x 3, no focal deficits  Discharge Labs:  Results for orders placed or performed during the hospital encounter of 06/05/14 (from the past 24 hour(s))  CBC with Differential     Status: Abnormal   Collection Time: 06/05/14  2:01 PM  Result Value Ref Range   WBC 8.1 4.0 - 10.5 K/uL   RBC 3.72 (L) 3.87 - 5.11 MIL/uL   Hemoglobin 9.2 (L) 12.0 - 15.0 g/dL   HCT 29.4 (L) 36.0 - 46.0 %   MCV 79.0 78.0 - 100.0 fL   MCH 24.7 (L) 26.0 - 34.0 pg   MCHC 31.3 30.0 - 36.0 g/dL   RDW 12.9 11.5 - 15.5 %   Platelets 380 150 - 400 K/uL   Neutrophils Relative % 64 43 - 77 %   Neutro Abs 5.2 1.7 - 7.7 K/uL   Lymphocytes Relative 32 12 - 46 %   Lymphs Abs 2.6 0.7 - 4.0 K/uL   Monocytes Relative 3 3 - 12 %   Monocytes Absolute 0.2 0.1 - 1.0 K/uL   Eosinophils Relative 1 0 - 5 %   Eosinophils Absolute 0.1 0.0 - 0.7 K/uL   Basophils Relative 0 0 - 1 %   Basophils Absolute 0.0 0.0 - 0.1 K/uL  Comprehensive metabolic panel     Status: Abnormal   Collection Time: 06/05/14  2:01 PM  Result Value Ref Range   Sodium 134 (L) 135 - 145 mmol/L   Potassium 5.1 3.5 - 5.1 mmol/L   Chloride 102 101 - 111 mmol/L   CO2 26 22 - 32 mmol/L   Glucose, Bld 448 (H) 65 - 99 mg/dL   BUN 31 (H) 6 - 20 mg/dL   Creatinine, Ser 1.71 (H) 0.44 - 1.00 mg/dL   Calcium 8.9 8.9 - 10.3 mg/dL   Total Protein 7.4 6.5 - 8.1 g/dL   Albumin 2.8 (L) 3.5 - 5.0 g/dL   AST 10 (L) 15 -  41 U/L   ALT 10 (L) 14 - 54 U/L   Alkaline Phosphatase 119 38 - 126 U/L   Total Bilirubin 0.3 0.3 - 1.2  mg/dL   GFR calc non Af Amer 36 (L) >60 mL/min   GFR calc Af Amer 41 (L) >60 mL/min   Anion gap 6 5 - 15  I-stat troponin, ED  (not at [facility])     Status: None   Collection Time: 06/05/14  2:14 PM  Result Value Ref Range   Troponin i, poc 0.00 0.00 - 0.08 ng/mL   Comment 3          CBG monitoring, ED     Status: Abnormal   Collection Time: 06/05/14  6:39 PM  Result Value Ref Range   Glucose-Capillary 277 (H) 65 - 99 mg/dL  Urinalysis, Routine w reflex microscopic (not at [facility])     Status: Abnormal   Collection Time: 06/05/14  7:00 PM  Result Value Ref Range   Color, Urine STRAW (A) YELLOW   APPearance HAZY (A) CLEAR   Specific Gravity, Urine 1.004 (L) 1.005 - 1.030   pH 6.0 5.0 - 8.0   Glucose, UA 100 (A) NEGATIVE mg/dL   Hgb urine dipstick SMALL (A) NEGATIVE   Bilirubin Urine NEGATIVE NEGATIVE   Ketones, ur NEGATIVE NEGATIVE mg/dL   Protein, ur 30 (A) NEGATIVE mg/dL   Urobilinogen, UA 0.2 0.0 - 1.0 mg/dL   Nitrite NEGATIVE NEGATIVE   Leukocytes, UA NEGATIVE NEGATIVE  Urine rapid drug screen (hosp performed)not at [facility]     Status: None   Collection Time: 06/05/14  7:00 PM  Result Value Ref Range   Opiates NONE DETECTED NONE DETECTED   Cocaine NONE DETECTED NONE DETECTED   Benzodiazepines NONE DETECTED NONE DETECTED   Amphetamines NONE DETECTED NONE DETECTED   Tetrahydrocannabinol NONE DETECTED NONE DETECTED   Barbiturates NONE DETECTED NONE DETECTED  Urine microscopic-add on     Status: Abnormal   Collection Time: 06/05/14  7:00 PM  Result Value Ref Range   Squamous Epithelial / LPF MANY (A) RARE   WBC, UA 7-10 <3 WBC/hpf   RBC / HPF 3-6 <3 RBC/hpf   Bacteria, UA MANY (A) RARE  Pregnancy, urine POC     Status: None   Collection Time: 06/05/14  7:19 PM  Result Value Ref Range   Preg Test, Ur NEGATIVE NEGATIVE  Glucose, capillary     Status: Abnormal   Collection Time: 06/05/14  9:12 PM  Result Value Ref Range   Glucose-Capillary 200 (H) 65 - 99 mg/dL  Vitamin B12      Status: None   Collection Time: 06/06/14  5:33 AM  Result Value Ref Range   Vitamin B-12 655 180 - 914 pg/mL  Folate     Status: None   Collection Time: 06/06/14  5:33 AM  Result Value Ref Range   Folate 8.7 >5.9 ng/mL  Iron and TIBC     Status: Abnormal   Collection Time: 06/06/14  5:33 AM  Result Value Ref Range   Iron 33 28 - 170 ug/dL   TIBC 203 (L) 250 - 450 ug/dL   Saturation Ratios 16 10.4 - 31.8 %   UIBC 170 ug/dL  Ferritin     Status: None   Collection Time: 06/06/14  5:33 AM  Result Value Ref Range   Ferritin 285 11 - 307 ng/mL  Reticulocytes     Status: Abnormal   Collection Time: 06/06/14  5:33 AM  Result Value Ref Range   Retic Ct Pct 1.3 0.4 - 3.1 %   RBC. 3.60 (L) 3.87 - 5.11 MIL/uL   Retic Count, Manual 46.8 19.0 - 186.0 K/uL  Basic metabolic panel     Status: Abnormal   Collection Time: 06/06/14  5:33 AM  Result Value Ref Range   Sodium 138 135 - 145 mmol/L   Potassium 4.9 3.5 - 5.1 mmol/L   Chloride 109 101 - 111 mmol/L   CO2 24 22 - 32 mmol/L   Glucose, Bld 174 (H) 65 - 99 mg/dL   BUN 25 (H) 6 - 20 mg/dL   Creatinine, Ser 1.37 (H) 0.44 - 1.00 mg/dL   Calcium 8.4 (L) 8.9 - 10.3 mg/dL   GFR calc non Af Amer 46 (L) >60 mL/min   GFR calc Af Amer 54 (L) >60 mL/min   Anion gap 5 5 - 15  Glucose, capillary     Status: Abnormal   Collection Time: 06/06/14  8:02 AM  Result Value Ref Range   Glucose-Capillary 134 (H) 65 - 99 mg/dL    Signed: Juliet Rude, MD 06/06/2014, 11:02 AM    Services Ordered on Discharge: None  Equipment Ordered on Discharge: None

## 2014-06-06 NOTE — Discharge Instructions (Signed)
It was a pleasure taking care of you, Susan Horton.  Please start taking Reglan 5 mg three times daily. This medicine should start to help with your nausea and vomiting.   You have an appointment with Dr. Cruzita Lederer tomorrow (5/27) at 8 AM. Please make this appointment as diabetes control is going to be very important for your diabetic gastroparesis. I have included some information about gastroparesis below.   Take care, Dr. Arcelia Jew    Gastroparesis  Gastroparesis is also called slowed stomach emptying (delayed gastric emptying). It is a condition in which the stomach takes too long to empty its contents. It often happens in people with diabetes.  CAUSES  Gastroparesis happens when nerves to the stomach are damaged or stop working. When the nerves are damaged, the muscles of the stomach and intestines do not work normally. The movement of food is slowed or stopped. High blood glucose (sugar) causes changes in nerves and can damage the blood vessels that carry oxygen and nutrients to the nerves. RISK FACTORS  Diabetes.  Post-viral syndromes.  Eating disorders (anorexia, bulimia).  Surgery on the stomach or vagus nerve.  Gastroesophageal reflux disease (rarely).  Smooth muscle disorders (amyloidosis, scleroderma).  Metabolic disorders, including hypothyroidism.  Parkinson disease. SYMPTOMS   Heartburn.  Feeling sick to your stomach (nausea).  Vomiting of undigested food.  An early feeling of fullness when eating.  Weight loss.  Abdominal bloating.  Erratic blood glucose levels.  Lack of appetite.  Gastroesophageal reflux.  Spasms of the stomach wall. Complications can include:  Bacterial overgrowth in stomach. Food stays in the stomach and can ferment and cause bacteria to grow.  Weight loss due to difficulty digesting and absorbing nutrients.  Vomiting.  Obstruction in the stomach. Undigested food can harden and cause nausea and vomiting.  Blood glucose  fluctuations caused by inconsistent food absorption. DIAGNOSIS  The diagnosis of gastroparesis is confirmed through one or more of the following tests:  Barium X-rays and scans. These tests look at how long it takes for food to move through the stomach.  Gastric manometry. This test measures electrical and muscular activity in the stomach. A thin tube is passed down the throat into the stomach. The tube contains a wire that takes measurements of the stomach's electrical and muscular activity as it digests liquids and solid food.  Endoscopy. This procedure is done with a long, thin tube called an endoscope. It is passed through the mouth and gently down the esophagus into the stomach. This tube helps the caregiver look at the lining of the stomach to check for any abnormalities.  Ultrasonography. This can rule out gallbladder disease or pancreatitis. This test will outline and define the shape of the gallbladder and pancreas. TREATMENT   Treatments may include:  Exercise.  Medicines to control nausea and vomiting.  Medicines to stimulate stomach muscles.  Changes in what and when you eat.  Having smaller meals more often.  Eating low-fiber forms of high-fiber foods, such as eating cooked vegetables instead of raw vegetables.  Eating low-fat foods.  Consuming liquids, which are easier to digest.  In severe cases, feeding tubes and intravenous (IV) feeding may be needed. It is important to note that in most cases, treatment does not cure gastroparesis. It is usually a lasting (chronic) condition. Treatment helps you manage the underlying condition so that you can be as healthy and comfortable as possible. Other treatments  A gastric neurostimulator has been developed to assist people with gastroparesis. The battery-operated device  is surgically implanted. It emits mild electrical pulses to help improve stomach emptying and to control nausea and vomiting.  The use of botulinum toxin  has been shown to improve stomach emptying by decreasing the prolonged contractions of the muscle between the stomach and the small intestine (pyloric sphincter). The benefits are temporary. SEEK MEDICAL CARE IF:   You have diabetes and you are having problems keeping your blood glucose in goal range.  You are having nausea, vomiting, bloating, or early feelings of fullness with eating.  Your symptoms do not change with a change in diet. Document Released: 12/28/2004 Document Revised: 04/24/2012 Document Reviewed: 06/06/2008 [facility] Patient Information 2015 [place], [place]. This information is not intended to replace advice given to you by your health care provider. Make sure you discuss any questions you have with your health care provider.

## 2014-06-06 NOTE — Progress Notes (Signed)
Consult Note:  Spoke with patient about diabetes and home regimen for diabetes control. Patient reports that she is followed by Dr. Cruzita Lederer (Endocrinologist) for diabetes management and currently she takes Lantus 25 units Daily and Novolog 5-20 units TID. Inquired about A1C levels and the patient thinks that Her last one from Dr. Cruzita Lederer was around 10 %. 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 Her job is very demanding and she is unable to cover herself, even find time to check her glucose and unable to eat during her shifts. When she is at home she drinks diet drinks and follows a diabetic diet, however most of her time is in 2 hours round trip traveling to work and most of her time during the day is at work. She does report once in awhile drinking a regular soda during the day. We discussed the importance of maintaining good CBG control to prevent long-term and short-term complications. Patient reports having DM for 20 years. She had been on an insulin pump in the past for 1-2 years but then did not have insurance to cover supplies. That is when she was switched to basal/bolus therapy. I discussed with her to have that conversation with Dr. Cruzita Lederer to see if the insulin pump would be appropriate for her at this time to keep her basal glucose levels controlled throughout the day. We discussed having good glucose control due to her admission diagnosis of gastroparesis and what that ment in relation to her DM. We also discussed keeping her glucose levels down to prevent future complications since she has had DM for 20 years at this point and she is so young. Patient verbalized understanding of information discussed and she states that she has no further questions at this time related to diabetes.   Thanks,  Tama Headings RN, MSN, [facility] Inpatient Diabetes Coordinator Team Pager [phone]

## 2014-06-06 NOTE — Progress Notes (Signed)
Noted order to dc patient home.DC instructions given and patient verbalized understaning

## 2014-06-07 ENCOUNTER — Encounter: Payer: Self-pay | Admitting: Internal Medicine

## 2014-06-07 ENCOUNTER — Ambulatory Visit (INDEPENDENT_AMBULATORY_CARE_PROVIDER_SITE_OTHER): Payer: BC Managed Care – PPO | Admitting: Internal Medicine

## 2014-06-07 VITALS — BP 124/82 | HR 91 | Temp 98.5°F | Resp 12 | Wt 172.0 lb

## 2014-06-07 DIAGNOSIS — E1143 Type 2 diabetes mellitus with diabetic autonomic (poly)neuropathy: Secondary | ICD-10-CM

## 2014-06-07 DIAGNOSIS — E1165 Type 2 diabetes mellitus with hyperglycemia: Secondary | ICD-10-CM | POA: Diagnosis not present

## 2014-06-07 DIAGNOSIS — K3184 Gastroparesis: Secondary | ICD-10-CM | POA: Diagnosis not present

## 2014-06-07 DIAGNOSIS — IMO0002 Reserved for concepts with insufficient information to code with codable children: Secondary | ICD-10-CM

## 2014-06-07 LAB — ANTI-ISLET CELL ANTIBODY: Pancreatic Islet Cell Antibody: NEGATIVE

## 2014-06-07 LAB — GLUTAMIC ACID DECARBOXYLASE AUTO ABS: Glutamic Acid Decarb Ab: <5 U/mL (ref 0.0–5.0)

## 2014-06-07 NOTE — Progress Notes (Signed)
Subjective:     Patient ID: Susan Horton, female   DOB: 1970/06/28, 44 y.o.   MRN: [id number]  HPI Ms Lamy is a 44 y.o. woman returning for f/u for DM (unclear if 1 or 2), dx in 1996 when pregnant with her daughter, insulin-dependent since 1999, uncontrolled, with complications (peripheral neuropathy, gastroparesis). Last visit 9 mo ago!  Patient was admitted on 5/25 and discharged on 06/06/2014 for gastroparesis exacerbation.   Anti-pancreatic antibodies were checked during the admission and they are pending at the moment. We could not check a C-peptide in the past, because her sugars were above 200 at the time of the visits.  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 She is looking to change her job.  Last HbA1c: 04/2014: HbA1c ~10% Lab Results  Component Value Date   HGBA1C 11.6* 08/22/2013   HGBA1C 13.5 Repeated and verified X2.* 04/14/2012   She is on a regimen of: Lantus - pens - 25 units 2x daily Novolog -NOT USING: 5 units for a small meal (b'fast) 10 units for a medium meal (lunch) 15 units for a large meal (dinner) NovoLog sliding scale: - 150-175: + 1 units - 176-200: + 2 units - 201-225: + 3 units - >225: + 4 units It is unclear whether she has type I or type 2 diabetes, the fact that she is on metformin would point towards type II, however she was told in the past that she has no pancreatic reserve.   She checks her sugars 3x a day - no log or meter again: - am: 200s, occasionally 300s >> 250s >> 234-250 >> low 200s - mid-morning: 90 (if skips b'fast) - 134 - before lunch: 250-300s >> 200-250 >> 170s-200s, 354 x2 >> n/c  - before dinner: 200-250 >> 198-230 >> 200-300 - before bedtime: 300-400s >> 250-<300 >> n/c  She cannot take breaks at work (works in a prison) >> leaves for work at 3:40 am - commutes 1h and 10 min.   - No CKD, but had AK I at last admission. Last  BUN/creatinine was: Lab Results  Component Value Date   BUN 25* 06/06/2014   Lab Results  Component Value Date   CREATININE 1.37* 06/06/2014   she is on lisinopril. - Latest cholesterol level: Lab Results  Component Value Date   CHOL 239* 01/01/2010   HDL 33* 01/01/2010   LDLCALC 176* 01/01/2010   TRIG 149 01/01/2010   CHOLHDL 7.2 Ratio 01/01/2010   she is not on a statin. - No DR, last eye exem: 02/2014 >> no DR reportedly - + tingling/numbness in feet >> sees podiatry, last visit this year.  She had multiple visits to the ED and admissions with hyperglycemia.  Review of Systems Constitutional: no weight gain/loss,  fatigue, + poor sleep, + nocturia Eyes: occasionally blurry vision, no xerophthalmia ENT: + sore throat, no nodules palpated in throat, no dysphagia/odynophagia, no hoarseness Cardiovascular: no CP/SOB/palpitations/swelling in hands and feet Respiratory: no cough/SOB Gastrointestinal: + N/+ V/no D/C Musculoskeletal: no muscle/joint aches Skin: no rashes Neurological: no tremors/numbness/tingling/dizziness  Objective:   Physical Exam BP 124/82 mmHg  Pulse 91  Temp(Src) 98.5 F (36.9 C) (Oral)  Resp 12  Wt 172 lb (78.019 kg)  SpO2 98% Body mass index is 28.62 kg/(m^2). Wt Readings from Last 3 Encounters:  06/05/14 169 lb (76.658 kg)  10/29/13 175 lb (79.379 kg)  10/26/13 175 lb (79.379 kg)  Constitutional: overweight - abdominal, in NAD Eyes: PERRLA, EOMI, no exophthalmos ENT: moist mucous membranes, no thyromegaly, no cervical lymphadenopathy Cardiovascular: RRR, No MRG Respiratory: CTA B Gastrointestinal: abdomen soft, NT, ND, BS+ Musculoskeletal: no deformities, strength intact in all 4 Skin: moist, warm, no rashes Neurological: no tremor with outstretched hands, DTR normal in all 4    Assessment:     1. DM - unclear If type I or type II, insulin-dependent, uncontrolled, with complications - peripheral neuropathy - gastroparesis 10/06/2005:  GES: Clinical Data: 44 year-old, gastroparesis.  NUCLEAR MEDICINE GASTRIC EMPTYING SCAN:  Technique: After oral ingestion of radiolabeled meal, sequential abdominal images were obtained for 120 minutes. Residual percentage of activity remaining within the stomach was calculated at 60 and 120 minutes.  Radiopharmaceutical: 2 mCi Tc-69m sulfur colloid in egg meal.  Findings: Anterior imaging was performed over the stomach over a 2-hour period. At 60 minutes, close to 100% of the activity remains in the stomach, although there is some activity in the small bowel. At 2 hours, 91% of the activity remained in the stomach.  IMPRESSION:  Marked delayed solid phase gastric emptying consistent with gastroparesis.  She was on an insulin pump which she liked and her DM was controlled then, but lost her insurance. I did gave her brochures about insulin pumps but she did decide yet.  Patient has been diagnosed with diabetes when she was 3, during her pregnancy, but it is unclear whether this is type I or type II. She has not have episodes of DKA, which would point more towards type 2 diabetes. I would like to check her C-peptide, however the patient mentions that she is rarely at or lower than 200, and the C-peptide measurement is only valid if the sugars are lower than that.   Plan:     1. DM - Pt with poor DM control due to lack of bolusing. Will await pancreatic antibodies to see if we can use other meds aside insulin. We also need a C peptide, but CBG today: 279. Re-emphasized the need to bolus for the carbs she eats not only for the high CBGs before a meal. She absolutely needs to have a meal schedule to be able to control her DM. - I advised her to:  Patient Instructions  Please continue Lantus 25 units 2x a day Start mealtime Novolog as follows: 5 units for a small meal (b'fast)  15 units for lunch and dinner Use NovoLog sliding scale as follows:  - 150- 165: + 1 unit  - 166- 180: + 2  units  - 181- 195: + 3 units  - 196- 210: + 4 units  - 211- 225: + 5 units  - 226- 240: + 6 units  - 241- 255: + 7 units  - 256- 270: + 8 units  - 271- 285: + 9 units  - > 286: + 10 units  Please return in 2 months with your sugar log.   - will get the records of her latest HbA1c from PCP - bring log at next visit - No Follow-up on file.    HbA1c better but still high.

## 2014-06-07 NOTE — Patient Instructions (Signed)
Patient Instructions  Please continue Lantus 25 units 2x a day Start mealtime Novolog as follows: 5 units for a small meal (b'fast)  15 units for lunch and dinner Use NovoLog sliding scale as follows:  - 150- 165: + 1 unit  - 166- 180: + 2 units  - 181- 195: + 3 units  - 196- 210: + 4 units  - 211- 225: + 5 units  - 226- 240: + 6 units  - 241- 255: + 7 units  - 256- 270: + 8 units  - 271- 285: + 9 units  - > 286: + 10 units  Please return in 2 months with your sugar log.

## 2014-06-12 ENCOUNTER — Encounter (HOSPITAL_COMMUNITY): Payer: Self-pay | Admitting: Emergency Medicine

## 2014-06-12 DIAGNOSIS — R112 Nausea with vomiting, unspecified: Secondary | ICD-10-CM | POA: Diagnosis present

## 2014-06-12 DIAGNOSIS — Z794 Long term (current) use of insulin: Secondary | ICD-10-CM | POA: Diagnosis not present

## 2014-06-12 DIAGNOSIS — K219 Gastro-esophageal reflux disease without esophagitis: Secondary | ICD-10-CM | POA: Insufficient documentation

## 2014-06-12 DIAGNOSIS — E1143 Type 2 diabetes mellitus with diabetic autonomic (poly)neuropathy: Secondary | ICD-10-CM | POA: Diagnosis not present

## 2014-06-12 DIAGNOSIS — Z79899 Other long term (current) drug therapy: Secondary | ICD-10-CM | POA: Insufficient documentation

## 2014-06-12 DIAGNOSIS — Z880 Allergy status to penicillin: Secondary | ICD-10-CM | POA: Insufficient documentation

## 2014-06-12 DIAGNOSIS — Z872 Personal history of diseases of the skin and subcutaneous tissue: Secondary | ICD-10-CM | POA: Diagnosis not present

## 2014-06-12 DIAGNOSIS — Z720 Tobacco use: Secondary | ICD-10-CM | POA: Diagnosis not present

## 2014-06-12 DIAGNOSIS — R079 Chest pain, unspecified: Secondary | ICD-10-CM | POA: Insufficient documentation

## 2014-06-12 DIAGNOSIS — G43909 Migraine, unspecified, not intractable, without status migrainosus: Secondary | ICD-10-CM | POA: Diagnosis not present

## 2014-06-12 DIAGNOSIS — Z87718 Personal history of other specified (corrected) congenital malformations of genitourinary system: Secondary | ICD-10-CM | POA: Diagnosis not present

## 2014-06-12 NOTE — ED Notes (Signed)
Pt recently seen here and admitted for intractable vomiting/gastroparesis. sts her symptoms persist. She has been vomiting at least 10x per day.

## 2014-06-12 NOTE — ED Notes (Signed)
sts she has not been able to set up a follow up apt with GI yet.

## 2014-06-13 ENCOUNTER — Emergency Department (HOSPITAL_COMMUNITY)
Admission: EM | Admit: 2014-06-13 | Discharge: 2014-06-13 | Disposition: A | Discharge status: Home / Self Care | Payer: BC Managed Care – PPO | Attending: Emergency Medicine | Admitting: Emergency Medicine

## 2014-06-13 DIAGNOSIS — K3184 Gastroparesis: Secondary | ICD-10-CM

## 2014-06-13 DIAGNOSIS — E1143 Type 2 diabetes mellitus with diabetic autonomic (poly)neuropathy: Secondary | ICD-10-CM

## 2014-06-13 LAB — COMPREHENSIVE METABOLIC PANEL
ALK PHOS: 113 U/L (ref 38–126)
ALT: 11 U/L — ABNORMAL LOW (ref 14–54)
AST: 11 U/L — ABNORMAL LOW (ref 15–41)
Albumin: 2.6 g/dL — ABNORMAL LOW (ref 3.5–5.0)
Anion gap: 8 (ref 5–15)
BUN: 33 mg/dL — AB (ref 6–20)
CALCIUM: 8.7 mg/dL — AB (ref 8.9–10.3)
CHLORIDE: 100 mmol/L — AB (ref 101–111)
CO2: 27 mmol/L (ref 22–32)
CREATININE: 1.67 mg/dL — AB (ref 0.44–1.00)
GFR calc Af Amer: 42 mL/min — ABNORMAL LOW (ref >60)
GFR calc non Af Amer: 37 mL/min — ABNORMAL LOW (ref >60)
Glucose, Bld: 267 mg/dL — ABNORMAL HIGH (ref 65–99)
POTASSIUM: 4.4 mmol/L (ref 3.5–5.1)
Sodium: 135 mmol/L (ref 135–145)
Total Bilirubin: 0.3 mg/dL (ref 0.3–1.2)
Total Protein: 7.1 g/dL (ref 6.5–8.1)

## 2014-06-13 LAB — URINALYSIS, ROUTINE W REFLEX MICROSCOPIC
Bilirubin Urine: NEGATIVE
Glucose, UA: 250 mg/dL — AB
Ketones, ur: NEGATIVE mg/dL
LEUKOCYTES UA: NEGATIVE
NITRITE: NEGATIVE
PH: 6.5 (ref 5.0–8.0)
Protein, ur: >300 mg/dL — AB
SPECIFIC GRAVITY, URINE: 1.014 (ref 1.005–1.030)
Urobilinogen, UA: 0.2 mg/dL (ref 0.0–1.0)

## 2014-06-13 LAB — CBC WITH DIFFERENTIAL/PLATELET
BASOS ABS: 0 10*3/uL (ref 0.0–0.1)
BASOS PCT: 0 % (ref 0–1)
EOS ABS: 0.1 10*3/uL (ref 0.0–0.7)
Eosinophils Relative: 1 % (ref 0–5)
HCT: 25.6 % — ABNORMAL LOW (ref 36.0–46.0)
Hemoglobin: 8.1 g/dL — ABNORMAL LOW (ref 12.0–15.0)
LYMPHS ABS: 3 10*3/uL (ref 0.7–4.0)
Lymphocytes Relative: 30 % (ref 12–46)
MCH: 25.1 pg — ABNORMAL LOW (ref 26.0–34.0)
MCHC: 31.6 g/dL (ref 30.0–36.0)
MCV: 79.3 fL (ref 78.0–100.0)
Monocytes Absolute: 0.5 10*3/uL (ref 0.1–1.0)
Monocytes Relative: 5 % (ref 3–12)
Neutro Abs: 6.4 10*3/uL (ref 1.7–7.7)
Neutrophils Relative %: 64 % (ref 43–77)
PLATELETS: 364 10*3/uL (ref 150–400)
RBC: 3.23 MIL/uL — ABNORMAL LOW (ref 3.87–5.11)
RDW: 12.7 % (ref 11.5–15.5)
WBC: 10.1 10*3/uL (ref 4.0–10.5)

## 2014-06-13 LAB — CBG MONITORING, ED: GLUCOSE-CAPILLARY: 255 mg/dL — AB (ref 65–99)

## 2014-06-13 LAB — URINE MICROSCOPIC-ADD ON

## 2014-06-13 LAB — LIPASE, BLOOD: LIPASE: 18 U/L — AB (ref 22–51)

## 2014-06-13 MED ORDER — METOCLOPRAMIDE HCL 5 MG/ML IJ SOLN
10.0000 mg | Freq: Once | INTRAMUSCULAR | Status: DC
  Filled 2014-06-13: qty 2

## 2014-06-13 MED ORDER — ONDANSETRON 8 MG PO TBDP: 8.0000 mg | ORAL_TABLET | Freq: Three times a day (TID) | ORAL | Status: DC | PRN

## 2014-06-13 MED ORDER — GI COCKTAIL ~~LOC~~
30.0000 mL | Freq: Once | ORAL | Status: DC
  Filled 2014-06-13: qty 30

## 2014-06-13 MED ORDER — SUCRALFATE 1 G PO TABS: 1.0000 g | ORAL_TABLET | Freq: Four times a day (QID) | ORAL | Status: DC

## 2014-06-13 MED ORDER — SODIUM CHLORIDE 0.9 % IV BOLUS (SEPSIS)
1000.0000 mL | Freq: Once | INTRAVENOUS | Status: AC
  Administered 2014-06-13: 1000 mL via INTRAVENOUS

## 2014-06-13 NOTE — Discharge Instructions (Signed)
Please follow-up with gastroenterology.  Diabetic gastroparesis is a difficult condition.  It is important to keep your blood sugars regulated.  Return to the emergency department for worsening condition or new concerning symptoms.    Gastroparesis  Gastroparesis is also called slowed stomach emptying (delayed gastric emptying). It is a condition in which the stomach takes too long to empty its contents. It often happens in people with diabetes.  CAUSES  Gastroparesis happens when nerves to the stomach are damaged or stop working. When the nerves are damaged, the muscles of the stomach and intestines do not work normally. The movement of food is slowed or stopped. High blood glucose (sugar) causes changes in nerves and can damage the blood vessels that carry oxygen and nutrients to the nerves. RISK FACTORS  Diabetes.  Post-viral syndromes.  Eating disorders (anorexia, bulimia).  Surgery on the stomach or vagus nerve.  Gastroesophageal reflux disease (rarely).  Smooth muscle disorders (amyloidosis, scleroderma).  Metabolic disorders, including hypothyroidism.  Parkinson disease. SYMPTOMS   Heartburn.  Feeling sick to your stomach (nausea).  Vomiting of undigested food.  An early feeling of fullness when eating.  Weight loss.  Abdominal bloating.  Erratic blood glucose levels.  Lack of appetite.  Gastroesophageal reflux.  Spasms of the stomach wall. Complications can include:  Bacterial overgrowth in stomach. Food stays in the stomach and can ferment and cause bacteria to grow.  Weight loss due to difficulty digesting and absorbing nutrients.  Vomiting.  Obstruction in the stomach. Undigested food can harden and cause nausea and vomiting.  Blood glucose fluctuations caused by inconsistent food absorption. DIAGNOSIS  The diagnosis of gastroparesis is confirmed through one or more of the following tests:  Barium X-rays and scans. These tests look at how long it  takes for food to move through the stomach.  Gastric manometry. This test measures electrical and muscular activity in the stomach. A thin tube is passed down the throat into the stomach. The tube contains a wire that takes measurements of the stomach's electrical and muscular activity as it digests liquids and solid food.  Endoscopy. This procedure is done with a long, thin tube called an endoscope. It is passed through the mouth and gently down the esophagus into the stomach. This tube helps the caregiver look at the lining of the stomach to check for any abnormalities.  Ultrasonography. This can rule out gallbladder disease or pancreatitis. This test will outline and define the shape of the gallbladder and pancreas. TREATMENT   Treatments may include:  Exercise.  Medicines to control nausea and vomiting.  Medicines to stimulate stomach muscles.  Changes in what and when you eat.  Having smaller meals more often.  Eating low-fiber forms of high-fiber foods, such as eating cooked vegetables instead of raw vegetables.  Eating low-fat foods.  Consuming liquids, which are easier to digest.  In severe cases, feeding tubes and intravenous (IV) feeding may be needed. It is important to note that in most cases, treatment does not cure gastroparesis. It is usually a lasting (chronic) condition. Treatment helps you manage the underlying condition so that you can be as healthy and comfortable as possible. Other treatments  A gastric neurostimulator has been developed to assist people with gastroparesis. The battery-operated device is surgically implanted. It emits mild electrical pulses to help improve stomach emptying and to control nausea and vomiting.  The use of botulinum toxin has been shown to improve stomach emptying by decreasing the prolonged contractions of the muscle between the  stomach and the small intestine (pyloric sphincter). The benefits are temporary. SEEK MEDICAL CARE IF:     You have diabetes and you are having problems keeping your blood glucose in goal range.  You are having nausea, vomiting, bloating, or early feelings of fullness with eating.  Your symptoms do not change with a change in diet. Document Released: 12/28/2004 Document Revised: 04/24/2012 Document Reviewed: 06/06/2008 [facility] Patient Information 2015 [place], [place]. This information is not intended to replace advice given to you by your health care provider. Make sure you discuss any questions you have with your health care provider.  How to Avoid Diabetes Problems You can do a lot to prevent or slow down diabetes problems. Following your diabetes plan and taking care of yourself can reduce your risk of serious or life-threatening complications. Below, you will find certain things you can do to prevent diabetes problems. MANAGE YOUR DIABETES Follow your health care provider's, nurse educator's, and dietitian's instructions for managing your diabetes. They will teach you the basics of diabetes care. They can help answer questions you may have. Learn about diabetes and make healthy choices regarding eating and physical activity. Monitor your blood glucose level regularly. Your health care provider will help you decide how often to check your blood glucose level depending on your treatment goals and how well you are meeting them.  DO NOT USE NICOTINE Nicotine and diabetes are a dangerous combination. Nicotine raises your risk for diabetes problems. If you quit using nicotine, you will lower your risk for heart attack, stroke, nerve disease, and kidney disease. Your cholesterol and your blood pressure levels may improve. Your blood circulation will also improve. Do not use any tobacco products, including cigarettes, chewing tobacco, or electronic cigarettes. If you need help quitting, ask your health care provider. KEEP YOUR BLOOD PRESSURE UNDER CONTROL Keeping your blood pressure under control will  help prevent damage to your eyes, kidneys, heart, and blood vessels. Blood pressure consists of two numbers. The top number should be below 120, and the bottom number should be below 80 (120/80). Keep your blood pressure as close to these numbers as you can. If you already have kidney disease, you may want even lower blood pressure to protect your kidneys. Talk to your health care provider to make sure that your blood pressure goal is right for your needs. Meal planning, medicines, and exercise can help you reach your blood pressure target. Have your blood pressure checked at every visit with your health care provider. KEEP YOUR CHOLESTEROL UNDER CONTROL Normal cholesterol levels will help prevent heart disease and stroke. These are the biggest health problems for people with diabetes. Keeping cholesterol levels under control can also help with blood flow. Have your cholesterol level checked at least once a year. Your health care provider may prescribe a medicine known as a statin. Statins lower your cholesterol. If you are not taking a statin, ask your health care provider if you should be. Meal planning, exercise, and medicines can help you reach your cholesterol targets.  SCHEDULE AND KEEP YOUR ANNUAL PHYSICAL EXAMS AND EYE EXAMS Your health care provider will tell you how often he or she wants to see you depending on your plan of treatment. It is important that you keep these appointments so that possible problems can be identified early and complications can be avoided or treated.  Every visit with your health care provider should include your weight, blood pressure, and an evaluation of your blood glucose control.  Your hemoglobin  A1c should be checked:  At least twice a year if you are at your goal.  Every 3 months if there are changes in treatment.  If you are not meeting your goals.  Your blood lipids should be checked yearly. You should also be checked yearly to see if you have protein in  your urine (microalbumin).  Schedule a dilated eye exam within 5 years of your diagnosis if you have type 1 diabetes, and then yearly. Schedule a dilated eye exam at diagnosis if you have type 2 diabetes, and then yearly. All exams thereafter can be extended to every 2 to 3 years if one or more exams have been normal. KEEP YOUR VACCINES CURRENT The flu vaccine is recommended yearly. The formula for the vaccine changes every year and needs to be updated for the best protection against current viruses. It is recommended that people with diabetes who are over 25 years old get the pneumonia vaccine. In some cases, two separate shots may be given. Ask your health care provider if your pneumonia vaccination is up-to-date. However, there are some instances where another vaccine is recommended. Check with your health care provider. TAKE CARE OF YOUR FEET  Diabetes may cause you to have a poor blood supply (circulation) to your legs and feet. Because of this, the skin may be thinner, break easier, and heal more slowly. You also may have nerve damage in your legs and feet, causing decreased feeling. You may not notice minor injuries to your feet that could lead to serious problems or infections. Taking care of your feet is very important. Visual foot exams are performed at every routine medical visit. The exams check for cuts, injuries, or other problems with the feet. A comprehensive foot exam should be done yearly. This includes visual inspection as well as assessing foot pulses and testing for loss of sensation. You should also do the following:  Inspect your feet daily for cuts, calluses, blisters, ingrown toenails, and signs of infection, such as redness, swelling, or pus.  Wash and dry your feet thoroughly, especially between the toes.  Avoid soaking your feet regularly in hot water baths.  Moisturize dry skin with lotion, avoiding areas between your toes.  Cut toenails straight across and file the  edges.  Avoid shoes that do not fit well or have areas that irritate your skin.  Avoid going barefooted or wearing only socks. Your feet need protection. TAKE CARE OF YOUR TEETH People with poorly controlled diabetes are more likely to have gum (periodontal) disease. These infections make diabetes harder to control. Periodontal diseases, if left untreated, can lead to tooth loss. Brush your teeth twice a day, floss, and see your dentist for checkups and cleaning every 6 months, or 2 times a year. ASK YOUR HEALTH CARE PROVIDER ABOUT TAKING ASPIRIN Taking aspirin daily is recommended to help prevent cardiovascular disease in people with and without diabetes. Ask your health care provider if this would benefit you and what dose he or she would recommend. DRINK RESPONSIBLY Moderate amounts of alcohol (less than 1 drink per day for adult women and less than 2 drinks per day for adult men) have a minimal effect on blood glucose if ingested with food. It is important to eat food with alcohol to avoid hypoglycemia. People should avoid alcohol if they have a history of alcohol abuse or dependence, if they are pregnant, and if they have liver disease, pancreatitis, advanced neuropathy, or severe hypertriglyceridemia. LESSEN STRESS Living with diabetes can be  stressful. When you are under stress, your blood glucose may be affected in two ways:  Stress hormones may cause your blood glucose to rise.  You may be distracted from taking good care of yourself. It is a good idea to be aware of your stress level and make changes that are necessary to help you better manage challenging situations. Support groups, planned relaxation, a hobby you enjoy, meditation, healthy relationships, and exercise all work to lower your stress level. If your efforts do not seem to be helping, get help from your health care provider or a trained mental health professional. Document Released: 09/15/2010 Document Revised: 05/14/2013  Document Reviewed: 02/21/2013 [facility] Patient Information 2015 [place], [place]. This information is not intended to replace advice given to you by your health care provider. Make sure you discuss any questions you have with your health care provider.

## 2014-06-13 NOTE — ED Provider Notes (Signed)
CSN: [id number]     Arrival date & time 06/12/14  2249 History  This chart was scribed for Linton Flemings, MD by Rayfield Citizen, ED Scribe. This patient was seen in room B19C/B19C and the patient's care was started at 3:11 AM.    Chief Complaint  Patient presents with  . Emesis   The history is provided by the patient. No language interpreter was used.    HPI Comments: MATEAH MULGREW is a 44 y.o. female with past medical history of GERD, IDDM who presents to the Emergency Department complaining of recurrent vomiting and chest discomfort. She explains she has vomiting both with and without eating, contents described as "food and green stuff." Patient was recently seen here and admitted for intractable vomiting/gastroparesis; patient states her symptoms persisted throughout her admission. She was started on Reglan during her stay; she reports compliance with this medication at home but no improvement in her symptoms. She has also tried phenergan without relief. She has tried a gastroparesis diet without success.   After patient's most recent discharge (Wednesday, 06/05/14), patient was able to connect with an endocrinologist (Dr Cruzita Lederer). Per nurse's note, she has been unable to follow up with GI at this time.   PCP Evans Blunt. Prior surgical history includes; appendectomy, cholecystectomy, vaginal hysterectomy, Patient was previously taking Xarelto after DVT. She is a current .25ppd smoker.   Past Medical History  Diagnosis Date  . Hidradenitis   . Asthma     prn inhaler  . GERD (gastroesophageal reflux disease)   . Diabetes mellitus     IDDM  . Headache(784.0)     migraines  . DVT (deep venous thrombosis)     20+ yrs ago (during pregnancy), 2015   Past Surgical History  Procedure Laterality Date  . Tonsillectomy    . Inguinal hidradenitis excision  07/06/2010    bilateral  . Exploratory laparotomy  08/14/2005    lysis of adhesions, drainage of tubo-ovarian abscess  . Breast reduction surgery   2002  . Appendectomy  1995  . Cholecystectomy  1993  . Tubal ligation  1996  . Vaginal hysterectomy  08/04/2005    and cysto  . Hydradenitis excision  02/08/2011    Procedure: EXCISION HYDRADENITIS GROIN;  Surgeon: [name], MD;  Location: [facility];  Service: General;  Laterality: N/A;  Excisioin of Hidradenitis Left groin  . Tummy tuck     Family History  Problem Relation Age of Onset  . Hypertension Father   . Hypertension Sister   . Hypertension Brother    History  Substance Use Topics  . Smoking status: Current Every Day Smoker -- 0.50 packs/day for 20 years    Types: Cigarettes  . Smokeless tobacco: Never Used     Comment: 1 pack/week  . Alcohol Use: No   OB History    No data available     Review of Systems  Cardiovascular: Positive for chest pain.  Gastrointestinal: Positive for nausea and vomiting.  All other systems reviewed and are negative.  Allergies  Penicillins  Home Medications   Prior to Admission medications   Medication Sig Start Date End Date Taking? Authorizing Provider  ACCU-CHEK AVIVA PLUS test strip  05/28/14   Historical Provider, MD  insulin aspart (NOVOLOG FLEXPEN) 100 UNIT/ML FlexPen Inject 5-20 Units into the skin 3 (three) times daily with meals. 08/22/13   Philemon Kingdom, MD  Insulin Glargine (LANTUS) 100 UNIT/ML Solostar Pen Inject 25 Units into the skin daily  at 10 pm. 06/06/14   Juliet Rude, MD  lisinopril (PRINIVIL,ZESTRIL) 10 MG tablet Take 10 mg by mouth daily.    Historical Provider, MD  metoCLOPramide (REGLAN) 5 MG tablet Take 1 tablet (5 mg total) by mouth 3 (three) times daily before meals. 06/06/14   Carly J Rivet, MD   BP 145/62 mmHg  Pulse 81  Temp(Src) 99.2 F (37.3 C) (Oral)  Resp 19  SpO2 100% Physical Exam  Constitutional: She is oriented to person, place, and time. She appears well-developed and well-nourished.  HENT:  Head: Normocephalic and atraumatic.  Nose: Nose normal.  Mouth/Throat:  Oropharynx is clear and moist.  Eyes: Conjunctivae and EOM are normal. Pupils are equal, round, and reactive to light.  Neck: Normal range of motion. Neck supple. No JVD present. No tracheal deviation present. No thyromegaly present.  Cardiovascular: Normal rate, regular rhythm, normal heart sounds and intact distal pulses.  Exam reveals no gallop and no friction rub.   No murmur heard. Pulmonary/Chest: Effort normal and breath sounds normal. No stridor. No respiratory distress. She has no wheezes. She has no rales. She exhibits no tenderness.  Abdominal: Soft. Bowel sounds are normal. She exhibits no distension and no mass. There is no tenderness. There is no rebound and no guarding.  Musculoskeletal: Normal range of motion. She exhibits no edema or tenderness.  Lymphadenopathy:    She has no cervical adenopathy.  Neurological: She is alert and oriented to person, place, and time. She displays normal reflexes. She exhibits normal muscle tone. Coordination normal.  Skin: Skin is warm and dry. No rash noted. No erythema. No pallor.  Psychiatric: She has a normal mood and affect. Her behavior is normal. Judgment and thought content normal.  Nursing note and vitals reviewed.   ED Course  Procedures   DIAGNOSTIC STUDIES: Oxygen Saturation is 100% on RA, normal by my interpretation.    COORDINATION OF CARE: 3:21 AM Discussed treatment plan with pt at bedside and pt agreed to plan.   Labs Review Labs Reviewed  CBC WITH DIFFERENTIAL/PLATELET - Abnormal; Notable for the following:    RBC 3.23 (*)    Hemoglobin 8.1 (*)    HCT 25.6 (*)    MCH 25.1 (*)    All other components within normal limits  COMPREHENSIVE METABOLIC PANEL - Abnormal; Notable for the following:    Chloride 100 (*)    Glucose, Bld 267 (*)    BUN 33 (*)    Creatinine, Ser 1.67 (*)    Calcium 8.7 (*)    Albumin 2.6 (*)    AST 11 (*)    ALT 11 (*)    GFR calc non Af Amer 37 (*)    GFR calc Af Amer 42 (*)    All  other components within normal limits  LIPASE, BLOOD - Abnormal; Notable for the following:    Lipase 18 (*)    All other components within normal limits  URINALYSIS, ROUTINE W REFLEX MICROSCOPIC (NOT AT [facility]) - Abnormal; Notable for the following:    Glucose, UA 250 (*)    Hgb urine dipstick MODERATE (*)    Protein, ur >300 (*)    All other components within normal limits  URINE MICROSCOPIC-ADD ON - Abnormal; Notable for the following:    Squamous Epithelial / LPF FEW (*)    Bacteria, UA FEW (*)    All other components within normal limits  CBG MONITORING, ED - Abnormal; Notable for the following:  Glucose-Capillary 255 (*)    All other components within normal limits  URINALYSIS, ROUTINE W REFLEX MICROSCOPIC (NOT AT [facility])    Imaging Review No results found.   EKG Interpretation None      MDM   Final diagnoses:  Diabetic gastroparesis    I personally performed the services described in this documentation, which was scribed in my presence. The recorded information has been reviewed and is accurate.  44 year old female, recent admission for intractable nausea and vomiting with persistent nausea and vomiting since discharge.  Patient not yet seen by gastroenterology, but has seen her endocrinologist.  Patient without signs of acute dehydration or hyperglycemia.  She reports she is getting no relief with Reglan at home.  Patient has received IV fluids here.  She has refused IV Reglan and GI cocktail.  Patient is frustrated with her disease, and does not wish "more Band-Aids to cover the problem".  I have tried to explain to the patient that gastroparesis is a difficult disease to manage in the emergency department, and if she is not having active signs of dehydration or intractable vomiting, she does not warrant admission or emergent GI consult.  As patient has been stable, she will be discharge to follow-up with gastroenterology in her primary care doctor.   Linton Flemings,  MD 06/13/14 [phone]

## 2014-06-13 NOTE — ED Notes (Signed)
Pt states she takes reglan at home and it hasn't been helping. Not sure that the GI cocktail would help or not. MD made aware.

## 2014-06-20 ENCOUNTER — Emergency Department (HOSPITAL_COMMUNITY)
Admission: EM | Admit: 2014-06-20 | Discharge: 2014-06-20 | Disposition: A | Discharge status: Home / Self Care | Payer: BC Managed Care – PPO | Attending: Emergency Medicine | Admitting: Emergency Medicine

## 2014-06-20 ENCOUNTER — Emergency Department (HOSPITAL_COMMUNITY): Payer: BC Managed Care – PPO

## 2014-06-20 ENCOUNTER — Encounter (HOSPITAL_COMMUNITY): Payer: Self-pay | Admitting: Emergency Medicine

## 2014-06-20 DIAGNOSIS — G43909 Migraine, unspecified, not intractable, without status migrainosus: Secondary | ICD-10-CM | POA: Insufficient documentation

## 2014-06-20 DIAGNOSIS — Z872 Personal history of diseases of the skin and subcutaneous tissue: Secondary | ICD-10-CM | POA: Diagnosis not present

## 2014-06-20 DIAGNOSIS — R079 Chest pain, unspecified: Secondary | ICD-10-CM | POA: Insufficient documentation

## 2014-06-20 DIAGNOSIS — Z720 Tobacco use: Secondary | ICD-10-CM | POA: Insufficient documentation

## 2014-06-20 DIAGNOSIS — R112 Nausea with vomiting, unspecified: Secondary | ICD-10-CM | POA: Diagnosis present

## 2014-06-20 DIAGNOSIS — E1143 Type 2 diabetes mellitus with diabetic autonomic (poly)neuropathy: Secondary | ICD-10-CM | POA: Insufficient documentation

## 2014-06-20 DIAGNOSIS — Z3202 Encounter for pregnancy test, result negative: Secondary | ICD-10-CM | POA: Insufficient documentation

## 2014-06-20 DIAGNOSIS — Z880 Allergy status to penicillin: Secondary | ICD-10-CM | POA: Insufficient documentation

## 2014-06-20 DIAGNOSIS — Z794 Long term (current) use of insulin: Secondary | ICD-10-CM | POA: Diagnosis not present

## 2014-06-20 DIAGNOSIS — K219 Gastro-esophageal reflux disease without esophagitis: Secondary | ICD-10-CM | POA: Diagnosis not present

## 2014-06-20 DIAGNOSIS — R110 Nausea: Secondary | ICD-10-CM

## 2014-06-20 DIAGNOSIS — Z86718 Personal history of other venous thrombosis and embolism: Secondary | ICD-10-CM | POA: Diagnosis not present

## 2014-06-20 DIAGNOSIS — Z79899 Other long term (current) drug therapy: Secondary | ICD-10-CM | POA: Insufficient documentation

## 2014-06-20 DIAGNOSIS — J45909 Unspecified asthma, uncomplicated: Secondary | ICD-10-CM | POA: Diagnosis not present

## 2014-06-20 LAB — CBC WITH DIFFERENTIAL/PLATELET
Basophils Absolute: 0 10*3/uL (ref 0.0–0.1)
Basophils Relative: 0 % (ref 0–1)
Eosinophils Absolute: 0.2 10*3/uL (ref 0.0–0.7)
Eosinophils Relative: 2 % (ref 0–5)
HCT: 28.4 % — ABNORMAL LOW (ref 36.0–46.0)
HEMOGLOBIN: 9 g/dL — AB (ref 12.0–15.0)
LYMPHS ABS: 2.9 10*3/uL (ref 0.7–4.0)
Lymphocytes Relative: 30 % (ref 12–46)
MCH: 25.6 pg — AB (ref 26.0–34.0)
MCHC: 31.7 g/dL (ref 30.0–36.0)
MCV: 80.9 fL (ref 78.0–100.0)
Monocytes Absolute: 0.4 10*3/uL (ref 0.1–1.0)
Monocytes Relative: 5 % (ref 3–12)
NEUTROS ABS: 6.2 10*3/uL (ref 1.7–7.7)
Neutrophils Relative %: 63 % (ref 43–77)
PLATELETS: 426 10*3/uL — AB (ref 150–400)
RBC: 3.51 MIL/uL — ABNORMAL LOW (ref 3.87–5.11)
RDW: 12.8 % (ref 11.5–15.5)
WBC: 9.7 10*3/uL (ref 4.0–10.5)

## 2014-06-20 LAB — URINALYSIS, ROUTINE W REFLEX MICROSCOPIC
Bilirubin Urine: NEGATIVE
GLUCOSE, UA: 250 mg/dL — AB
KETONES UR: NEGATIVE mg/dL
Leukocytes, UA: NEGATIVE
Nitrite: NEGATIVE
Specific Gravity, Urine: 1.015 (ref 1.005–1.030)
UROBILINOGEN UA: 0.2 mg/dL (ref 0.0–1.0)
pH: 6 (ref 5.0–8.0)

## 2014-06-20 LAB — LIPASE, BLOOD: Lipase: 17 U/L — ABNORMAL LOW (ref 22–51)

## 2014-06-20 LAB — COMPREHENSIVE METABOLIC PANEL
ALT: 11 U/L — AB (ref 14–54)
AST: 10 U/L — ABNORMAL LOW (ref 15–41)
Albumin: 2.8 g/dL — ABNORMAL LOW (ref 3.5–5.0)
Alkaline Phosphatase: 129 U/L — ABNORMAL HIGH (ref 38–126)
Anion gap: 8 (ref 5–15)
BILIRUBIN TOTAL: 0.3 mg/dL (ref 0.3–1.2)
BUN: 29 mg/dL — ABNORMAL HIGH (ref 6–20)
CHLORIDE: 105 mmol/L (ref 101–111)
CO2: 25 mmol/L (ref 22–32)
Calcium: 8.8 mg/dL — ABNORMAL LOW (ref 8.9–10.3)
Creatinine, Ser: 1.59 mg/dL — ABNORMAL HIGH (ref 0.44–1.00)
GFR calc non Af Amer: 39 mL/min — ABNORMAL LOW (ref >60)
GFR, EST AFRICAN AMERICAN: 45 mL/min — AB (ref >60)
Glucose, Bld: 204 mg/dL — ABNORMAL HIGH (ref 65–99)
Potassium: 4 mmol/L (ref 3.5–5.1)
SODIUM: 138 mmol/L (ref 135–145)
TOTAL PROTEIN: 7.3 g/dL (ref 6.5–8.1)

## 2014-06-20 LAB — URINE MICROSCOPIC-ADD ON

## 2014-06-20 LAB — POC URINE PREG, ED: Preg Test, Ur: NEGATIVE

## 2014-06-20 MED ORDER — SODIUM CHLORIDE 0.9 % IV BOLUS (SEPSIS)
1000.0000 mL | Freq: Once | INTRAVENOUS | Status: AC
  Administered 2014-06-20: 1000 mL via INTRAVENOUS

## 2014-06-20 NOTE — ED Notes (Signed)
Pt c/o n/v x 3-4weeks, has been seen for this before. Pt states nothing we give her is working.

## 2014-06-20 NOTE — ED Provider Notes (Signed)
CSN: [id number]     Arrival date & time 06/20/14  1027 History   First MD Initiated Contact with Patient 06/20/14 1030     Chief Complaint  Patient presents with  . Nausea    vomit  . Emesis     (Consider location/radiation/quality/duration/timing/severity/associated sxs/prior Treatment) HPI Susan Horton is a 44 y.o. female with a history of diabetic gastroparesis, DVT comes in for evaluation of nausea and vomiting. Patient states for the past 3 weeks she has had nausea and vomiting. She has been seen for this problem in the ED. She reports having a follow-up appointment with GI on June 14. She has tried Zofran, Reglan and Phenergan without relief. She denies any bloody vomitus. She reports associated chest discomfort after vomiting. Nothing seems to make her problem better or worse. She denies fevers, chills, headache, shortness of breath, abdominal pain, urinary symptoms, vaginal bleeding or discharge, numbness or weakness. No other aggravating or modifying factors. Patient works as a Curator and denies any illicit drug use including marijuana. Also reports she stopped smoking cigarettes 2 months ago.  Past Medical History  Diagnosis Date  . Hidradenitis   . Asthma     prn inhaler  . GERD (gastroesophageal reflux disease)   . Diabetes mellitus     IDDM  . Headache(784.0)     migraines  . DVT (deep venous thrombosis)     20+ yrs ago (during pregnancy), 2015   Past Surgical History  Procedure Laterality Date  . Tonsillectomy    . Inguinal hidradenitis excision  07/06/2010    bilateral  . Exploratory laparotomy  08/14/2005    lysis of adhesions, drainage of tubo-ovarian abscess  . Breast reduction surgery  2002  . Appendectomy  1995  . Cholecystectomy  1993  . Tubal ligation  1996  . Vaginal hysterectomy  08/04/2005    and cysto  . Hydradenitis excision  02/08/2011    Procedure: EXCISION HYDRADENITIS GROIN;  Surgeon: [name], MD;  Location: [facility];  Service: General;  Laterality: N/A;  Excisioin of Hidradenitis Left groin  . Tummy tuck     Family History  Problem Relation Age of Onset  . Hypertension Father   . Hypertension Sister   . Hypertension Brother    History  Substance Use Topics  . Smoking status: Current Every Day Smoker -- 0.50 packs/day for 20 years    Types: Cigarettes  . Smokeless tobacco: Never Used     Comment: 1 pack/week  . Alcohol Use: No   OB History    No data available     Review of Systems A 10 point review of systems was completed and was negative except for pertinent positives and negatives as mentioned in the history of present illness     Allergies  Penicillins  Home Medications   Prior to Admission medications   Medication Sig Start Date End Date Taking? Authorizing Provider  insulin aspart (NOVOLOG FLEXPEN) 100 UNIT/ML FlexPen Inject 5-20 Units into the skin 3 (three) times daily with meals. 08/22/13  Yes Philemon Kingdom, MD  Insulin Glargine (LANTUS) 100 UNIT/ML Solostar Pen Inject 25 Units into the skin daily at 10 pm. 06/06/14  Yes Carly Montey Hora, MD  lisinopril (PRINIVIL,ZESTRIL) 10 MG tablet Take 10 mg by mouth daily.   Yes Historical Provider, MD  metoCLOPramide (REGLAN) 5 MG tablet Take 1 tablet (5 mg total) by mouth 3 (three) times daily before meals. 06/06/14  Yes Carly J Rivet,  MD  ondansetron (ZOFRAN ODT) 8 MG disintegrating tablet Take 1 tablet (8 mg total) by mouth every 8 (eight) hours as needed for nausea or vomiting. 06/13/14  Yes Linton Flemings, MD  sucralfate (CARAFATE) 1 G tablet Take 1 tablet (1 g total) by mouth 4 (four) times daily. 06/13/14  Yes Linton Flemings, MD   BP 145/77 mmHg  Pulse 77  Temp(Src) 98.2 F (36.8 C) (Oral)  Resp 20  SpO2 100% Physical Exam  Constitutional: She is oriented to person, place, and time. She appears well-developed and well-nourished.  HENT:  Head: Normocephalic and atraumatic.  Mouth/Throat: Oropharynx is clear and moist.   Eyes: Conjunctivae are normal. Pupils are equal, round, and reactive to light. Right eye exhibits no discharge. Left eye exhibits no discharge. No scleral icterus.  Neck: Normal range of motion. Neck supple.  Cardiovascular: Normal rate, regular rhythm and normal heart sounds.   Pulmonary/Chest: Effort normal and breath sounds normal. No respiratory distress. She has no wheezes. She has no rales.  Abdominal: Soft. There is no tenderness.  Musculoskeletal: She exhibits no tenderness.  Neurological: She is alert and oriented to person, place, and time.  Cranial Nerves II-XII grossly intact  Skin: Skin is warm and dry. No rash noted.  Psychiatric: She has a normal mood and affect.  Nursing note and vitals reviewed.   ED Course  Procedures (including critical care time) Labs Review Labs Reviewed  CBC WITH DIFFERENTIAL/PLATELET - Abnormal; Notable for the following:    RBC 3.51 (*)    Hemoglobin 9.0 (*)    HCT 28.4 (*)    MCH 25.6 (*)    Platelets 426 (*)    All other components within normal limits  COMPREHENSIVE METABOLIC PANEL - Abnormal; Notable for the following:    Glucose, Bld 204 (*)    BUN 29 (*)    Creatinine, Ser 1.59 (*)    Calcium 8.8 (*)    Albumin 2.8 (*)    AST 10 (*)    ALT 11 (*)    Alkaline Phosphatase 129 (*)    GFR calc non Af Amer 39 (*)    GFR calc Af Amer 45 (*)    All other components within normal limits  LIPASE, BLOOD - Abnormal; Notable for the following:    Lipase 17 (*)    All other components within normal limits  URINALYSIS, ROUTINE W REFLEX MICROSCOPIC (NOT AT [facility]) - Abnormal; Notable for the following:    APPearance CLOUDY (*)    Glucose, UA 250 (*)    Hgb urine dipstick MODERATE (*)    Protein, ur >300 (*)    All other components within normal limits  URINE MICROSCOPIC-ADD ON - Abnormal; Notable for the following:    Squamous Epithelial / LPF MANY (*)    Bacteria, UA MANY (*)    All other components within normal limits  POC URINE  PREG, ED    Imaging Review Dg Abd Acute W/chest  06/20/2014   CLINICAL DATA:  Nausea and vomiting for 3-4 weeks.  EXAM: DG ABDOMEN ACUTE W/ 1V CHEST  COMPARISON:  Chest x-ray on 06/05/2014  FINDINGS: Lung volumes are normal. There is no evidence of pulmonary edema, consolidation, pneumothorax, nodule or pleural fluid. The heart size is within normal limits.  Abdominal films show no evidence of bowel obstruction or ileus. No free air is identified. Clips are present related to prior cholecystectomy. No evidence of abnormal calcifications, soft tissue abnormalities or bony abnormalities.  IMPRESSION: Normal abdominal  series.   Electronically Signed   By: Aletta Edouard M.D.   On: 06/20/2014 11:18     EKG Interpretation None     Meds given in ED:  Medications  sodium chloride 0.9 % bolus 1,000 mL (1,000 mLs Intravenous New Bag/Given 06/20/14 1117)    New Prescriptions   No medications on file   Filed Vitals:   06/20/14 1045  BP: 145/77  Pulse: 77  Temp: 98.2 F (36.8 C)  TempSrc: Oral  Resp: 20  SpO2: 100%    MDM  Vitals stable - WNL -afebrile Pt resting comfortably in ED. PE--benign abdominal exam. Grossly benign physical exam. Labwork--labs at baseline and essentially noncontributory. Imaging--plain films of abdomen and chest are unremarkable. No evidence of perforation.  Patient states she will continue to take her home Reglan until her appointment on June 14 with gastroenterology. She has not had any vomiting in the ED. She is tolerating PO. No evidence of dehydration.  I discussed all relevant lab findings and imaging results with pt and they verbalized understanding. Discussed f/u with PCP within 48 hrs and return precautions, pt very amenable to plan.  Final diagnoses:  Nausea        Comer Locket, PA-C 06/20/14 1618  Quintella Reichert, MD 06/21/14 1150

## 2014-06-20 NOTE — ED Notes (Signed)
Questions, concerns r/t dc. Pt is ambulatory and a&ox4

## 2014-06-20 NOTE — Discharge Instructions (Signed)
Is important for you to maintain appropriate hydration. You may do this by drinking water/G2 or mixing regular Gatorade with water. You may take your Reglan at home as needed for nausea. Please follow-up with your gastroenterologist for regularly scheduled appointment on the 14th. Return to ED for new or worsening symptoms.  Nausea and Vomiting Nausea is a sick feeling that often comes before throwing up (vomiting). Vomiting is a reflex where stomach contents come out of your mouth. Vomiting can cause severe loss of body fluids (dehydration). Children and elderly adults can become dehydrated quickly, especially if they also have diarrhea. Nausea and vomiting are symptoms of a condition or disease. It is important to find the cause of your symptoms. CAUSES   Direct irritation of the stomach lining. This irritation can result from increased acid production (gastroesophageal reflux disease), infection, food poisoning, taking certain medicines (such as nonsteroidal anti-inflammatory drugs), alcohol use, or tobacco use.  Signals from the brain.These signals could be caused by a headache, heat exposure, an inner ear disturbance, increased pressure in the brain from injury, infection, a tumor, or a concussion, pain, emotional stimulus, or metabolic problems.  An obstruction in the gastrointestinal tract (bowel obstruction).  Illnesses such as diabetes, hepatitis, gallbladder problems, appendicitis, kidney problems, cancer, sepsis, atypical symptoms of a heart attack, or eating disorders.  Medical treatments such as chemotherapy and radiation.  Receiving medicine that makes you sleep (general anesthetic) during surgery. DIAGNOSIS Your caregiver may ask for tests to be done if the problems do not improve after a few days. Tests may also be done if symptoms are severe or if the reason for the nausea and vomiting is not clear. Tests may include:  Urine tests.  Blood tests.  Stool tests.  Cultures (to  look for evidence of infection).  X-rays or other imaging studies. Test results can help your caregiver make decisions about treatment or the need for additional tests. TREATMENT You need to stay well hydrated. Drink frequently but in small amounts.You may wish to drink water, sports drinks, clear broth, or eat frozen ice pops or gelatin dessert to help stay hydrated.When you eat, eating slowly may help prevent nausea.There are also some antinausea medicines that may help prevent nausea. HOME CARE INSTRUCTIONS   Take all medicine as directed by your caregiver.  If you do not have an appetite, do not force yourself to eat. However, you must continue to drink fluids.  If you have an appetite, eat a normal diet unless your caregiver tells you differently.  Eat a variety of complex carbohydrates (rice, wheat, potatoes, bread), lean meats, yogurt, fruits, and vegetables.  Avoid high-fat foods because they are more difficult to digest.  Drink enough water and fluids to keep your urine clear or pale yellow.  If you are dehydrated, ask your caregiver for specific rehydration instructions. Signs of dehydration may include:  Severe thirst.  Dry lips and mouth.  Dizziness.  Dark urine.  Decreasing urine frequency and amount.  Confusion.  Rapid breathing or pulse. SEEK IMMEDIATE MEDICAL CARE IF:   You have blood or brown flecks (like coffee grounds) in your vomit.  You have black or bloody stools.  You have a severe headache or stiff neck.  You are confused.  You have severe abdominal pain.  You have chest pain or trouble breathing.  You do not urinate at least once every 8 hours.  You develop cold or clammy skin.  You continue to vomit for longer than 24 to 48 hours.  You have a fever. MAKE SURE YOU:   Understand these instructions.  Will watch your condition.  Will get help right away if you are not doing well or get worse. Document Released: 12/28/2004  Document Revised: 03/22/2011 Document Reviewed: 05/27/2010 [facility] Patient Information 2015 [place], [place]. This information is not intended to replace advice given to you by your health care provider. Make sure you discuss any questions you have with your health care provider.

## 2014-06-20 NOTE — ED Notes (Signed)
Patient transported to X-ray 

## 2014-07-25 ENCOUNTER — Emergency Department (HOSPITAL_COMMUNITY): Payer: BC Managed Care – PPO

## 2014-07-25 ENCOUNTER — Encounter (HOSPITAL_COMMUNITY): Payer: Self-pay | Admitting: Emergency Medicine

## 2014-07-25 DIAGNOSIS — J45909 Unspecified asthma, uncomplicated: Secondary | ICD-10-CM | POA: Insufficient documentation

## 2014-07-25 DIAGNOSIS — R140 Abdominal distension (gaseous): Secondary | ICD-10-CM | POA: Insufficient documentation

## 2014-07-25 DIAGNOSIS — K219 Gastro-esophageal reflux disease without esophagitis: Secondary | ICD-10-CM | POA: Insufficient documentation

## 2014-07-25 DIAGNOSIS — Z880 Allergy status to penicillin: Secondary | ICD-10-CM | POA: Insufficient documentation

## 2014-07-25 DIAGNOSIS — Z79899 Other long term (current) drug therapy: Secondary | ICD-10-CM | POA: Diagnosis not present

## 2014-07-25 DIAGNOSIS — E119 Type 2 diabetes mellitus without complications: Secondary | ICD-10-CM | POA: Insufficient documentation

## 2014-07-25 DIAGNOSIS — R079 Chest pain, unspecified: Secondary | ICD-10-CM | POA: Diagnosis present

## 2014-07-25 DIAGNOSIS — Z3202 Encounter for pregnancy test, result negative: Secondary | ICD-10-CM | POA: Insufficient documentation

## 2014-07-25 DIAGNOSIS — Z872 Personal history of diseases of the skin and subcutaneous tissue: Secondary | ICD-10-CM | POA: Insufficient documentation

## 2014-07-25 DIAGNOSIS — Z86718 Personal history of other venous thrombosis and embolism: Secondary | ICD-10-CM | POA: Diagnosis not present

## 2014-07-25 DIAGNOSIS — Z794 Long term (current) use of insulin: Secondary | ICD-10-CM | POA: Diagnosis not present

## 2014-07-25 DIAGNOSIS — K458 Other specified abdominal hernia without obstruction or gangrene: Secondary | ICD-10-CM | POA: Diagnosis not present

## 2014-07-25 DIAGNOSIS — Z720 Tobacco use: Secondary | ICD-10-CM | POA: Diagnosis not present

## 2014-07-25 NOTE — ED Notes (Signed)
Pt. reports intermittent left chest pain with emesis for 4 months , denies SOB / no diaphoresis .

## 2014-07-25 NOTE — ED Notes (Signed)
Spoke with mini lab i-stat troponin 0.0

## 2014-07-26 ENCOUNTER — Emergency Department (HOSPITAL_COMMUNITY): Payer: BC Managed Care – PPO

## 2014-07-26 ENCOUNTER — Other Ambulatory Visit (HOSPITAL_COMMUNITY): Payer: Self-pay | Admitting: Nurse Practitioner

## 2014-07-26 ENCOUNTER — Encounter (HOSPITAL_COMMUNITY): Payer: Self-pay

## 2014-07-26 ENCOUNTER — Emergency Department (HOSPITAL_COMMUNITY)
Admission: EM | Admit: 2014-07-26 | Discharge: 2014-07-26 | Disposition: A | Discharge status: Home / Self Care | Payer: BC Managed Care – PPO | Attending: Emergency Medicine | Admitting: Emergency Medicine

## 2014-07-26 DIAGNOSIS — R140 Abdominal distension (gaseous): Secondary | ICD-10-CM

## 2014-07-26 DIAGNOSIS — Z1231 Encounter for screening mammogram for malignant neoplasm of breast: Secondary | ICD-10-CM

## 2014-07-26 DIAGNOSIS — K458 Other specified abdominal hernia without obstruction or gangrene: Secondary | ICD-10-CM

## 2014-07-26 LAB — I-STAT CHEM 8, ED
BUN: 38 mg/dL — ABNORMAL HIGH (ref 6–20)
Calcium, Ion: 1.15 mmol/L (ref 1.12–1.23)
Chloride: 101 mmol/L (ref 101–111)
Creatinine, Ser: 2.3 mg/dL — ABNORMAL HIGH (ref 0.44–1.00)
Glucose, Bld: 382 mg/dL — ABNORMAL HIGH (ref 65–99)
HCT: 29 % — ABNORMAL LOW (ref 36.0–46.0)
HEMOGLOBIN: 9.9 g/dL — AB (ref 12.0–15.0)
Potassium: 4.6 mmol/L (ref 3.5–5.1)
Sodium: 133 mmol/L — ABNORMAL LOW (ref 135–145)
TCO2: 22 mmol/L (ref 0–100)

## 2014-07-26 LAB — CBC
HCT: 29.9 % — ABNORMAL LOW (ref 36.0–46.0)
Hemoglobin: 9.7 g/dL — ABNORMAL LOW (ref 12.0–15.0)
MCH: 25 pg — AB (ref 26.0–34.0)
MCHC: 32.4 g/dL (ref 30.0–36.0)
MCV: 77.1 fL — AB (ref 78.0–100.0)
PLATELETS: 422 10*3/uL — AB (ref 150–400)
RBC: 3.88 MIL/uL (ref 3.87–5.11)
RDW: 13.3 % (ref 11.5–15.5)
WBC: 13 10*3/uL — AB (ref 4.0–10.5)

## 2014-07-26 LAB — COMPREHENSIVE METABOLIC PANEL
ALK PHOS: 137 U/L — AB (ref 38–126)
ALT: 12 U/L — AB (ref 14–54)
ANION GAP: 8 (ref 5–15)
AST: 12 U/L — AB (ref 15–41)
Albumin: 2.5 g/dL — ABNORMAL LOW (ref 3.5–5.0)
BUN: 39 mg/dL — AB (ref 6–20)
CALCIUM: 8.5 mg/dL — AB (ref 8.9–10.3)
CHLORIDE: 100 mmol/L — AB (ref 101–111)
CO2: 23 mmol/L (ref 22–32)
Creatinine, Ser: 2.23 mg/dL — ABNORMAL HIGH (ref 0.44–1.00)
GFR calc non Af Amer: 26 mL/min — ABNORMAL LOW (ref >60)
GFR, EST AFRICAN AMERICAN: 30 mL/min — AB (ref >60)
Glucose, Bld: 379 mg/dL — ABNORMAL HIGH (ref 65–99)
POTASSIUM: 4.6 mmol/L (ref 3.5–5.1)
SODIUM: 131 mmol/L — AB (ref 135–145)
TOTAL PROTEIN: 6.8 g/dL (ref 6.5–8.1)
Total Bilirubin: 0.3 mg/dL (ref 0.3–1.2)

## 2014-07-26 LAB — BASIC METABOLIC PANEL
ANION GAP: 9 (ref 5–15)
BUN: 37 mg/dL — ABNORMAL HIGH (ref 6–20)
CO2: 24 mmol/L (ref 22–32)
Calcium: 8.7 mg/dL — ABNORMAL LOW (ref 8.9–10.3)
Chloride: 98 mmol/L — ABNORMAL LOW (ref 101–111)
Creatinine, Ser: 2.16 mg/dL — ABNORMAL HIGH (ref 0.44–1.00)
GFR, EST AFRICAN AMERICAN: 31 mL/min — AB (ref >60)
GFR, EST NON AFRICAN AMERICAN: 27 mL/min — AB (ref >60)
Glucose, Bld: 298 mg/dL — ABNORMAL HIGH (ref 65–99)
POTASSIUM: 4.4 mmol/L (ref 3.5–5.1)
Sodium: 131 mmol/L — ABNORMAL LOW (ref 135–145)

## 2014-07-26 LAB — I-STAT TROPONIN, ED: Troponin i, poc: 0 ng/mL (ref 0.00–0.08)

## 2014-07-26 LAB — POC URINE PREG, ED: Preg Test, Ur: NEGATIVE

## 2014-07-26 LAB — LIPASE, BLOOD: LIPASE: 19 U/L — AB (ref 22–51)

## 2014-07-26 MED ORDER — RANITIDINE HCL 75 MG/5ML PO SYRP: 150.0000 mg | ORAL_SOLUTION | Freq: Two times a day (BID) | ORAL | Status: DC

## 2014-07-26 MED ORDER — ALUM & MAG HYDROXIDE-SIMETH 200-200-20 MG/5ML PO SUSP
30.0000 mL | Freq: Once | ORAL | Status: AC
  Administered 2014-07-26: 30 mL via ORAL
  Filled 2014-07-26: qty 30

## 2014-07-26 MED ORDER — SODIUM CHLORIDE 0.9 % IV BOLUS (SEPSIS)
1000.0000 mL | Freq: Once | INTRAVENOUS | Status: AC
  Administered 2014-07-26: 1000 mL via INTRAVENOUS

## 2014-07-26 MED ORDER — RANITIDINE HCL 150 MG/10ML PO SYRP
300.0000 mg | ORAL_SOLUTION | Freq: Once | ORAL | Status: AC
  Administered 2014-07-26: 300 mg via ORAL
  Filled 2014-07-26: qty 20

## 2014-07-26 MED ORDER — ALUM & MAG HYDROXIDE-SIMETH 200-200-20 MG/5ML PO SUSP: 30.0000 mL | Freq: Three times a day (TID) | ORAL | Status: DC | PRN

## 2014-07-26 MED ORDER — IOHEXOL 300 MG/ML  SOLN
25.0000 mL | Freq: Once | INTRAMUSCULAR | Status: AC | PRN
  Administered 2014-07-26: 25 mL via ORAL

## 2014-07-26 NOTE — Discharge Instructions (Signed)
Nausea and Vomiting Ms. Susan Horton, Your CT scan shows a very small hernia.  This may be the cause of your nausea and vomiting.  Continue to use zofran or reglan at home for control and see a GI doctor within 3 days for close follow up.  If any symptoms worsen, come back to the ED immediately. Thank you.    FINDINGS: Dependent type atelectasis in the lung bases.  Surgical absence of the gallbladder. The unenhanced appearance of the liver, spleen, pancreas, adrenal glands, kidneys, abdominal aorta, inferior vena cava, and retroperitoneal lymph nodes is unremarkable. Stomach, small bowel, and colon are not abnormally distended. No wall thickening is appreciated where opacified. Stool fills the colon. No free air or free fluid in the abdomen. Minimal anterior abdominal wall hernia containing fat. Scarring in the anterior abdominal wall consistent with postoperative change.  Pelvis: Appendix is not identified. Uterus is surgically absent. Mild bladder wall thickening could indicate cystitis. No free or loculated pelvic fluid collections. No pelvic mass or lymphadenopathy. Adnexal structures are not enlarged. No destructive bone lesions.  IMPRESSION: No evidence of bowel obstruction or inflammation. Postoperative scarring in the anterior abdominal wall. Minimal anterior abdominal wall hernia containing fat. Mild thickening of the bladder wall may indicate cystitis or could be due to incomplete distention.   Nausea means you feel sick to your stomach. Throwing up (vomiting) is a reflex where stomach contents come out of your mouth. HOME CARE   Take medicine as told by your doctor.  Do not force yourself to eat. However, you do need to drink fluids.  If you feel like eating, eat a normal diet as told by your doctor.  Eat rice, wheat, potatoes, bread, lean meats, yogurt, fruits, and vegetables.  Avoid high-fat foods.  Drink enough fluids to keep your pee (urine) clear or pale  yellow.  Ask your doctor how to replace body fluid losses (rehydrate). Signs of body fluid loss (dehydration) include:  Feeling very thirsty.  Dry lips and mouth.  Feeling dizzy.  Dark pee.  Peeing less than normal.  Feeling confused.  Fast breathing or heart rate. GET HELP RIGHT AWAY IF:   You have blood in your throw up.  You have black or bloody poop (stool).  You have a bad headache or stiff neck.  You feel confused.  You have bad belly (abdominal) pain.  You have chest pain or trouble breathing.  You do not pee at least once every 8 hours.  You have cold, clammy skin.  You keep throwing up after 24 to 48 hours.  You have a fever. MAKE SURE YOU:   Understand these instructions.  Will watch your condition.  Will get help right away if you are not doing well or get worse. Document Released: 06/16/2007 Document Revised: 03/22/2011 Document Reviewed: 05/29/2010 [facility] Patient Information 2015 [place], [place]. This information is not intended to replace advice given to you by your health care provider. Make sure you discuss any questions you have with your health care provider.

## 2014-07-26 NOTE — ED Provider Notes (Signed)
CSN: [id number]     Arrival date & time 07/25/14  2123 History  This chart was scribed for Everlene Balls, MD by Evelene Croon, ED Scribe. This patient was seen in room D32C/D32C and the patient's care was started 1:18 AM.     Chief Complaint  Patient presents with  . Chest Pain  . Emesis    The history is provided by the patient. No language interpreter was used.    HPI Comments:  LAKEYSHIA SARASIN is a 44 y.o. female who presents to the Emergency Department complaining of daily episodes of vomiting for 2 months. Pt states she has been evaluated in the p for infection.ast and was diagnosed with gastroparesis. She reports associated nausea, abdominal distention, CP and sore throat which she attributes to the vomiting. She denies changes in BMs, and dysuria. She has taken zofran, reglan and phergan without relief. No exacerbating factors noted. Pt has a h/o abdominal surgery- "tummy tuck"~10 months ago.  Past Medical History  Diagnosis Date  . Hidradenitis   . Asthma     prn inhaler  . GERD (gastroesophageal reflux disease)   . Diabetes mellitus     IDDM  . Headache(784.0)     migraines  . DVT (deep venous thrombosis)     20+ yrs ago (during pregnancy), 2015   Past Surgical History  Procedure Laterality Date  . Tonsillectomy    . Inguinal hidradenitis excision  07/06/2010    bilateral  . Exploratory laparotomy  08/14/2005    lysis of adhesions, drainage of tubo-ovarian abscess  . Breast reduction surgery  2002  . Appendectomy  1995  . Cholecystectomy  1993  . Tubal ligation  1996  . Vaginal hysterectomy  08/04/2005    and cysto  . Hydradenitis excision  02/08/2011    Procedure: EXCISION HYDRADENITIS GROIN;  Surgeon: [name], MD;  Location: [place];  Service: General;  Laterality: N/A;  Excisioin of Hidradenitis Left groin  . Tummy tuck     Family History  Problem Relation Age of Onset  . Hypertension Father   . Hypertension Sister   . Hypertension  Brother    History  Substance Use Topics  . Smoking status: Current Every Day Smoker -- 0.50 packs/day for 20 years    Types: Cigarettes  . Smokeless tobacco: Never Used     Comment: 1 pack/week  . Alcohol Use: No   OB History    No data available     Review of Systems  A complete 10 system review of systems was obtained and all systems are negative except as noted in the HPI and PMH.     Allergies  Penicillins  Home Medications   Prior to Admission medications   Medication Sig Start Date End Date Taking? Authorizing Provider  insulin aspart (NOVOLOG FLEXPEN) 100 UNIT/ML FlexPen Inject 5-20 Units into the skin 3 (three) times daily with meals. 08/22/13   Philemon Kingdom, MD  Insulin Glargine (LANTUS) 100 UNIT/ML Solostar Pen Inject 25 Units into the skin daily at 10 pm. 06/06/14   Juliet Rude, MD  lisinopril (PRINIVIL,ZESTRIL) 10 MG tablet Take 10 mg by mouth daily.    Historical Provider, MD  metoCLOPramide (REGLAN) 5 MG tablet Take 1 tablet (5 mg total) by mouth 3 (three) times daily before meals. 06/06/14   Carly Montey Hora, MD  ondansetron (ZOFRAN ODT) 8 MG disintegrating tablet Take 1 tablet (8 mg total) by mouth every 8 (eight) hours as needed for  nausea or vomiting. 06/13/14   Linton Flemings, MD  sucralfate (CARAFATE) 1 G tablet Take 1 tablet (1 g total) by mouth 4 (four) times daily. 06/13/14   Linton Flemings, MD   BP 147/68 mmHg  Pulse 93  Temp(Src) 98.9 F (37.2 C) (Oral)  Resp 16  Ht 5\' 5"  (1.651 m)  Wt 172 lb (78.019 kg)  BMI 28.62 kg/m2  SpO2 100% Physical Exam  Constitutional: She is oriented to person, place, and time. She appears well-developed and well-nourished. No distress.  HENT:  Head: Normocephalic and atraumatic.  Nose: Nose normal.  Mouth/Throat: Oropharynx is clear and moist. No oropharyngeal exudate.  Eyes: Conjunctivae and EOM are normal. Pupils are equal, round, and reactive to light. No scleral icterus.  Neck: Normal range of motion. Neck supple. No JVD  present. No tracheal deviation present. No thyromegaly present.  Cardiovascular: Normal rate, regular rhythm and normal heart sounds.  Exam reveals no gallop and no friction rub.   No murmur heard. Pulmonary/Chest: Effort normal and breath sounds normal. No respiratory distress. She has no wheezes. She exhibits no tenderness.  Abdominal: Soft. Bowel sounds are normal. She exhibits distension. She exhibits no mass. There is no tenderness. There is no rebound and no guarding.  Musculoskeletal: Normal range of motion. She exhibits no edema or tenderness.  Lymphadenopathy:    She has no cervical adenopathy.  Neurological: She is alert and oriented to person, place, and time. No cranial nerve deficit. She exhibits normal muscle tone.  Skin: Skin is warm and dry. No rash noted. No erythema. No pallor.  Nursing note and vitals reviewed.   ED Course  Procedures   DIAGNOSTIC STUDIES:  Oxygen Saturation is 99% on RA, normal by my interpretation.    COORDINATION OF CARE:  1:24 AM Will order CT A/P with PO contrast. Discussed treatment plan with pt at bedside and pt agreed to plan.  Labs Review Labs Reviewed  BASIC METABOLIC PANEL  CBC  BASIC METABOLIC PANEL  CBC    Imaging Review Ct Abdomen Pelvis Wo Contrast  07/26/2014   CLINICAL DATA:  Abdominal distention and bloating. Daily vomiting for 4 months. Tummy tuck 10 months ago.  EXAM: CT ABDOMEN AND PELVIS WITHOUT CONTRAST  TECHNIQUE: Multidetector CT imaging of the abdomen and pelvis was performed following the standard protocol without IV contrast.  COMPARISON:  08/13/2005  FINDINGS: Dependent type atelectasis in the lung bases.  Surgical absence of the gallbladder. The unenhanced appearance of the liver, spleen, pancreas, adrenal glands, kidneys, abdominal aorta, inferior vena cava, and retroperitoneal lymph nodes is unremarkable. Stomach, small bowel, and colon are not abnormally distended. No wall thickening is appreciated where opacified.  Stool fills the colon. No free air or free fluid in the abdomen. Minimal anterior abdominal wall hernia containing fat. Scarring in the anterior abdominal wall consistent with postoperative change.  Pelvis: Appendix is not identified. Uterus is surgically absent. Mild bladder wall thickening could indicate cystitis. No free or loculated pelvic fluid collections. No pelvic mass or lymphadenopathy. Adnexal structures are not enlarged. No destructive bone lesions.  IMPRESSION: No evidence of bowel obstruction or inflammation. Postoperative scarring in the anterior abdominal wall. Minimal anterior abdominal wall hernia containing fat. Mild thickening of the bladder wall may indicate cystitis or could be due to incomplete distention.   Electronically Signed   By: Lucienne Capers M.D.   On: 07/26/2014 03:28   Dg Chest 2 View  07/25/2014   CLINICAL DATA:  Intermittent left chest pain with  emesis for 4 months.  EXAM: CHEST  2 VIEW  COMPARISON:  06/20/2014  FINDINGS: The heart size and mediastinal contours are within normal limits. Both lungs are clear. The visualized skeletal structures are unremarkable.  IMPRESSION: No active cardiopulmonary disease.   Electronically Signed   By: Lucienne Capers M.D.   On: 07/25/2014 22:27     EKG Interpretation None      MDM   Final diagnoses:  None    Patient since emergency department forAbdominal distention for the past month. She has been to the emergency department and her primary care physician and gastroenterology without a diagnosis. She has had a normal EGD and abdominal x-ray showing only gastritis. She has used Zofran, Reglan, Phenergan at home without any relief.  Will obtain CT of abdemon for evaluation since this has not been done.  She was given IVF for dehydration on labs.  Hgb is 9.7 but baseline is 9.0.  WBC 13, Cr 2.16 with baseline of 1.5.  Na 131.  CMP and lipase are pending.    CMP and lipase are normal.  Will send home with maalox and ranitidine  as this has seemed to partially help the patient.  Given GI follow up. She appears well, resting comfortably in the room.  Only one episode of emesis in the ED.  Her VS remain within her normal limits and she is safe for DC.  I personally performed the services described in this documentation, which was scribed in my presence. The recorded information has been reviewed and is accurate.   Everlene Balls, MD 07/26/14 1348

## 2014-07-26 NOTE — ED Notes (Signed)
Patient transported to CT 

## 2014-07-26 NOTE — ED Notes (Signed)
Urine sample was collected by Loleta Books EMT.

## 2014-07-26 NOTE — ED Notes (Signed)
Collected urine from patient for a POC Urine Pregnancy Test. The nurse clicked off on the task.

## 2014-07-26 NOTE — ED Notes (Signed)
Verified labs and called CT and spoke to Zachary, pregnancy test negative, creatinine 2.16, BUN 37.

## 2014-08-05 ENCOUNTER — Ambulatory Visit (HOSPITAL_COMMUNITY)
Admission: RE | Admit: 2014-08-05 | Discharge: 2014-08-05 | Disposition: A | Discharge status: Home / Self Care | Payer: BC Managed Care – PPO | Source: Ambulatory Visit | Attending: Nurse Practitioner | Admitting: Nurse Practitioner

## 2014-08-05 DIAGNOSIS — Z1231 Encounter for screening mammogram for malignant neoplasm of breast: Secondary | ICD-10-CM | POA: Diagnosis present

## 2014-08-06 ENCOUNTER — Other Ambulatory Visit: Payer: Self-pay | Admitting: Nurse Practitioner

## 2014-08-06 DIAGNOSIS — R928 Other abnormal and inconclusive findings on diagnostic imaging of breast: Secondary | ICD-10-CM

## 2014-08-15 ENCOUNTER — Ambulatory Visit
Admission: RE | Admit: 2014-08-15 | Discharge: 2014-08-15 | Disposition: A | Discharge status: Home / Self Care | Payer: 59 | Source: Ambulatory Visit | Attending: Nurse Practitioner | Admitting: Nurse Practitioner

## 2014-08-15 DIAGNOSIS — R928 Other abnormal and inconclusive findings on diagnostic imaging of breast: Secondary | ICD-10-CM

## 2014-09-03 ENCOUNTER — Other Ambulatory Visit: Payer: Self-pay | Admitting: *Deleted

## 2014-09-03 ENCOUNTER — Ambulatory Visit (INDEPENDENT_AMBULATORY_CARE_PROVIDER_SITE_OTHER): Payer: 59 | Admitting: Internal Medicine

## 2014-09-03 ENCOUNTER — Encounter: Payer: Self-pay | Admitting: Internal Medicine

## 2014-09-03 ENCOUNTER — Other Ambulatory Visit (INDEPENDENT_AMBULATORY_CARE_PROVIDER_SITE_OTHER): Payer: 59 | Admitting: *Deleted

## 2014-09-03 VITALS — BP 118/70 | HR 95 | Temp 98.1°F | Resp 12 | Wt 176.0 lb

## 2014-09-03 DIAGNOSIS — E119 Type 2 diabetes mellitus without complications: Secondary | ICD-10-CM | POA: Diagnosis not present

## 2014-09-03 DIAGNOSIS — IMO0002 Reserved for concepts with insufficient information to code with codable children: Secondary | ICD-10-CM

## 2014-09-03 DIAGNOSIS — Z794 Long term (current) use of insulin: Secondary | ICD-10-CM

## 2014-09-03 DIAGNOSIS — E1142 Type 2 diabetes mellitus with diabetic polyneuropathy: Secondary | ICD-10-CM

## 2014-09-03 DIAGNOSIS — E1165 Type 2 diabetes mellitus with hyperglycemia: Secondary | ICD-10-CM

## 2014-09-03 LAB — POCT GLYCOSYLATED HEMOGLOBIN (HGB A1C): Hemoglobin A1C: 12.4

## 2014-09-03 MED ORDER — GLUCOSE BLOOD VI STRP: ORAL_STRIP | Status: DC

## 2014-09-03 MED ORDER — ONETOUCH DELICA LANCETS FINE MISC: Status: DC

## 2014-09-03 MED ORDER — INSULIN ASPART 100 UNIT/ML FLEXPEN: 10.0000 [IU] | PEN_INJECTOR | Freq: Three times a day (TID) | SUBCUTANEOUS | Status: DC

## 2014-09-03 NOTE — Patient Instructions (Signed)
Please continue Lantus 25 units 2x a day Increase mealtime Novolog as follows: 10 units for a small meal (b'fast)  20 units for lunch and dinner Continue NovoLog sliding scale as follows:  - 150- 165: + 1 unit  - 166- 180: + 2 units  - 181- 195: + 3 units  - 196- 210: + 4 units  - 211- 225: + 5 units  - 226- 240: + 6 units  - 241- 255: + 7 units  - 256- 270: + 8 units  - 271- 285: + 9 units  - > 286: + 10 units  Please return in 1.5 months with your sugar log.   Check sugars at least 4x a day and bring log or meter at next visit.

## 2014-09-03 NOTE — Progress Notes (Signed)
Subjective:     Patient ID: Susan Horton, female   DOB: 03/21/1970, 44 y.o.   MRN: [id number]  HPI [name] is a 44 y.o. woman returning for f/u for DM (unclear if 1 or 2), dx in 1996 when pregnant with her daughter, insulin-dependent since 1999, uncontrolled, with complications (peripheral neuropathy, gastroparesis). Last visit 3 mo ago.  She now has a new job >> can sit down and take meal brakes. No more commuting.   Patient was seen multiple times in the ED for gastroparesis exacerbation. She also has a cough (presumed 2/2 Lisinopril initially, now believed to be 2/2 GERD. She is on Reglan tid >> not helping.   Anti-pancreatic antibodies were negative. We could not check a C-peptide in the past, because her sugars were above 200 at the time of the visits. She was told in the past that she has no pancreatic reserve.   She would like to   Last HbA1c: 04/2014: HbA1c ~10% Lab Results  Component Value Date   HGBA1C 11.6* 08/22/2013   HGBA1C 13.5 Repeated and verified X2.* 04/14/2012   She is on a regimen of: Lantus - pens - 25 units 2x daily Novolog - was not using this at last visit, now mentions she uses it: 5 units for a small meal (b'fast)  10 units for lunch and dinner NovoLog sliding scale as follows:  - 150- 165: + 1 unit  - 166- 180: + 2 units  - 181- 195: + 3 units  - 196- 210: + 4 units  - 211- 225: + 5 units  - 226- 240: + 6 units  - 241- 255: + 7 units  - 256- 270: + 8 units  - 271- 285: + 9 units  - > 286: + 10 units  She checks her sugars 3x a day - no log nor meter again (meter stopped working): - am: 200s, occasionally 300s >> 250s >> 234-250 >> low 200s >> 210-230 - mid-morning: 90 (if skips b'fast) - 134 >> n/c - before lunch: 250-300s >> 200-250 >> 170s-200s, 354 x2 >> n/c  - before dinner: 200-250 >> 198-230 >> 200-300 >> 260-300 - before bedtime: 300-400s >> 250-<300 >> n/c  She cannot take breaks at work (works in a [employer]) >> leaves for work at 3:40 am  - commutes 1h and 10 min.   - No CKD, but had AKI at last admission. Last BUN/creatinine was: Lab Results  Component Value Date   BUN 38* 07/26/2014   Lab Results  Component Value Date   CREATININE 2.30* 07/26/2014   she is on lisinopril. - Latest cholesterol level: Lab Results  Component Value Date   CHOL 239* 01/01/2010   HDL 33* 01/01/2010   LDLCALC 176* 01/01/2010   TRIG 149 01/01/2010   CHOLHDL 7.2 Ratio 01/01/2010   she is not on a statin. - No DR, last eye exem: 02/2014 >> no DR reportedly - + tingling/numbness in feet >> sees podiatry, last visit this year.  She had multiple visits to the ED and admissions with hyperglycemia.  Review of Systems Constitutional: no weight gain/loss,  fatigue, + poor sleep, no nocturia Eyes: occasionally blurry vision, no xerophthalmia ENT:no sore throat, no nodules palpated in throat, no dysphagia/odynophagia, no hoarseness Cardiovascular: no CP/SOB/palpitations/swelling in hands and feet Respiratory: + cough/no SOB Gastrointestinal: + N/+ V/no D/C Musculoskeletal: no muscle/joint aches Skin: no rashes Neurological: no tremors/numbness/tingling/dizziness, + HA  Objective:   Physical Exam BP 118/70 mmHg  Pulse 95  Temp(Src) 98.1 F (36.7 C) (Oral)  Resp 12  Wt 176 lb (79.833 kg)  SpO2 99% Body mass index is 29.29 kg/(m^2). Wt Readings from Last 3 Encounters:  09/03/14 176 lb (79.833 kg)  07/25/14 172 lb (78.019 kg)  06/07/14 172 lb (78.019 kg)  Constitutional: overweight - abdominal, in NAD Eyes: PERRLA, EOMI, no exophthalmos ENT: moist mucous membranes, no thyromegaly, no cervical lymphadenopathy Cardiovascular: RRR, No MRG Respiratory: CTA B Gastrointestinal: abdomen soft, NT, ND, BS+ Musculoskeletal: no deformities, strength intact in all 4 Skin: moist, warm, no rashes Neurological: no tremor with outstretched hands, DTR normal in all 4    Assessment:     1. DM - unclear If type I or type II, insulin-dependent,  uncontrolled, with complications - peripheral neuropathy - gastroparesis 10/06/2005: GES: Clinical Data: 44 year-old, gastroparesis.  NUCLEAR MEDICINE GASTRIC EMPTYING SCAN:  Technique: After oral ingestion of radiolabeled meal, sequential abdominal images were obtained for 120 minutes. Residual percentage of activity remaining within the stomach was calculated at 60 and 120 minutes.  Radiopharmaceutical: 2 mCi Tc-48m sulfur colloid in egg meal.  Findings: Anterior imaging was performed over the stomach over a 2-hour period. At 60 minutes, close to 100% of the activity remains in the stomach, although there is some activity in the small bowel. At 2 hours, 91% of the activity remained in the stomach.  IMPRESSION:  Marked delayed solid phase gastric emptying consistent with gastroparesis.  She was on an insulin pump which she liked and her DM was controlled then, but lost her insurance. I did gave her brochures about insulin pumps but she did decide yet.  Patient has been diagnosed with diabetes when she was 75, during her pregnancy, but it is unclear whether this is type I or type II. She has not have episodes of DKA, which would point more towards type 2 diabetes. I would like to check her C-peptide, however the patient mentions that she is rarely at or lower than 200, and the C-peptide measurement is only valid if the sugars are lower than that.   Component     Latest Ref Rng 06/06/2014  Glutamic Acid Decarb Ab     0.0 - 5.0 U/mL <5.0  Pancreatic Islet Cell Antibody     Neg:<1:1 Negative   Plan:     1. DM - Pt with poor DM control due to lack of bolusing/checking sugars. I again underlined compliance. She mentions that she will start doing better as she has a new job that allows breaks. She is interested in a pump >> we need at least 2 monthsof documented sugars checked 4-8x a day before starting this. We also need a C peptide, but CBG today: 334...  - I advised her to increase  NovoLog as follows: Patient Instructions  Please continue Lantus 25 units 2x a day Increase mealtime Novolog as follows: 10 units for a small meal (b'fast)  20 units for lunch and dinner Continue NovoLog sliding scale as follows:  - 150- 165: + 1 unit  - 166- 180: + 2 units  - 181- 195: + 3 units  - 196- 210: + 4 units  - 211- 225: + 5 units  - 226- 240: + 6 units  - 241- 255: + 7 units  - 256- 270: + 8 units  - 271- 285: + 9 units  - > 286: + 10 units  Please return in 1.5 months with your sugar log.   Check sugars at least 4x a day  and bring log or meter at next visit.  - bring log/meter at next visit - we gave her a new meter: OneTouch Verio.  - check HbA1c today >> 12.4% (higher) - Return in about 6 weeks (around 10/15/2014).

## 2014-10-22 ENCOUNTER — Emergency Department (HOSPITAL_COMMUNITY): Payer: 59

## 2014-10-22 ENCOUNTER — Encounter (HOSPITAL_COMMUNITY): Payer: Self-pay | Admitting: Emergency Medicine

## 2014-10-22 DIAGNOSIS — Z794 Long term (current) use of insulin: Secondary | ICD-10-CM | POA: Insufficient documentation

## 2014-10-22 DIAGNOSIS — J45909 Unspecified asthma, uncomplicated: Secondary | ICD-10-CM | POA: Diagnosis not present

## 2014-10-22 DIAGNOSIS — Z872 Personal history of diseases of the skin and subcutaneous tissue: Secondary | ICD-10-CM | POA: Diagnosis not present

## 2014-10-22 DIAGNOSIS — E119 Type 2 diabetes mellitus without complications: Secondary | ICD-10-CM | POA: Diagnosis not present

## 2014-10-22 DIAGNOSIS — Z86718 Personal history of other venous thrombosis and embolism: Secondary | ICD-10-CM | POA: Diagnosis not present

## 2014-10-22 DIAGNOSIS — Z3202 Encounter for pregnancy test, result negative: Secondary | ICD-10-CM | POA: Diagnosis not present

## 2014-10-22 DIAGNOSIS — G43909 Migraine, unspecified, not intractable, without status migrainosus: Secondary | ICD-10-CM | POA: Insufficient documentation

## 2014-10-22 DIAGNOSIS — Z79899 Other long term (current) drug therapy: Secondary | ICD-10-CM | POA: Insufficient documentation

## 2014-10-22 DIAGNOSIS — R112 Nausea with vomiting, unspecified: Secondary | ICD-10-CM | POA: Diagnosis present

## 2014-10-22 DIAGNOSIS — Z720 Tobacco use: Secondary | ICD-10-CM | POA: Insufficient documentation

## 2014-10-22 DIAGNOSIS — Z880 Allergy status to penicillin: Secondary | ICD-10-CM | POA: Diagnosis not present

## 2014-10-22 DIAGNOSIS — K219 Gastro-esophageal reflux disease without esophagitis: Secondary | ICD-10-CM | POA: Insufficient documentation

## 2014-10-22 LAB — CBC
HCT: 27.1 % — ABNORMAL LOW (ref 36.0–46.0)
Hemoglobin: 8.5 g/dL — ABNORMAL LOW (ref 12.0–15.0)
MCH: 24.6 pg — ABNORMAL LOW (ref 26.0–34.0)
MCHC: 31.4 g/dL (ref 30.0–36.0)
MCV: 78.3 fL (ref 78.0–100.0)
Platelets: 417 10*3/uL — ABNORMAL HIGH (ref 150–400)
RBC: 3.46 MIL/uL — ABNORMAL LOW (ref 3.87–5.11)
RDW: 12.7 % (ref 11.5–15.5)
WBC: 12.2 10*3/uL — ABNORMAL HIGH (ref 4.0–10.5)

## 2014-10-22 LAB — COMPREHENSIVE METABOLIC PANEL
ALT: 13 U/L — ABNORMAL LOW (ref 14–54)
ANION GAP: 9 (ref 5–15)
AST: 13 U/L — AB (ref 15–41)
Albumin: 2.9 g/dL — ABNORMAL LOW (ref 3.5–5.0)
Alkaline Phosphatase: 139 U/L — ABNORMAL HIGH (ref 38–126)
BUN: 42 mg/dL — ABNORMAL HIGH (ref 6–20)
CHLORIDE: 96 mmol/L — AB (ref 101–111)
CO2: 24 mmol/L (ref 22–32)
Calcium: 8.5 mg/dL — ABNORMAL LOW (ref 8.9–10.3)
Creatinine, Ser: 2.74 mg/dL — ABNORMAL HIGH (ref 0.44–1.00)
GFR calc Af Amer: 23 mL/min — ABNORMAL LOW (ref >60)
GFR, EST NON AFRICAN AMERICAN: 20 mL/min — AB (ref >60)
Glucose, Bld: 354 mg/dL — ABNORMAL HIGH (ref 65–99)
POTASSIUM: 4.5 mmol/L (ref 3.5–5.1)
Sodium: 129 mmol/L — ABNORMAL LOW (ref 135–145)
Total Bilirubin: 0.3 mg/dL (ref 0.3–1.2)
Total Protein: 7.6 g/dL (ref 6.5–8.1)

## 2014-10-22 LAB — LIPASE, BLOOD: LIPASE: 29 U/L (ref 22–51)

## 2014-10-22 LAB — TROPONIN I: Troponin I: <0.03 ng/mL (ref <0.031)

## 2014-10-22 MED ORDER — ONDANSETRON 4 MG PO TBDP
8.0000 mg | ORAL_TABLET | Freq: Once | ORAL | Status: DC
  Filled 2014-10-22: qty 2

## 2014-10-22 NOTE — ED Notes (Signed)
Pt reports cough, nv x 6 mo. C/o cp for months as well. Pt originally started coughing  D/t bp meds which was stopped 6 mo. Ago. Denies fever.

## 2014-10-22 NOTE — ED Notes (Signed)
Pt actively vomiting in triage 

## 2014-10-23 ENCOUNTER — Emergency Department (HOSPITAL_COMMUNITY): Payer: 59

## 2014-10-23 ENCOUNTER — Emergency Department (HOSPITAL_COMMUNITY)
Admission: EM | Admit: 2014-10-23 | Discharge: 2014-10-23 | Disposition: A | Discharge status: Home / Self Care | Payer: 59 | Attending: Emergency Medicine | Admitting: Emergency Medicine

## 2014-10-23 DIAGNOSIS — R112 Nausea with vomiting, unspecified: Secondary | ICD-10-CM

## 2014-10-23 DIAGNOSIS — R059 Cough, unspecified: Secondary | ICD-10-CM

## 2014-10-23 DIAGNOSIS — K219 Gastro-esophageal reflux disease without esophagitis: Secondary | ICD-10-CM

## 2014-10-23 DIAGNOSIS — R05 Cough: Secondary | ICD-10-CM

## 2014-10-23 DIAGNOSIS — R079 Chest pain, unspecified: Secondary | ICD-10-CM

## 2014-10-23 LAB — APTT: aPTT: 32 seconds (ref 24–37)

## 2014-10-23 LAB — PROTIME-INR
INR: 1.07 (ref 0.00–1.49)
Prothrombin Time: 14.1 seconds (ref 11.6–15.2)

## 2014-10-23 LAB — I-STAT TROPONIN, ED: Troponin i, poc: 0 ng/mL (ref 0.00–0.08)

## 2014-10-23 LAB — I-STAT BETA HCG BLOOD, ED (MC, WL, AP ONLY): I-stat hCG, quantitative: <5 m[IU]/mL (ref <5)

## 2014-10-23 MED ORDER — GI COCKTAIL ~~LOC~~
ORAL | Status: AC
  Administered 2014-10-23: 04:00:00
  Filled 2014-10-23: qty 30

## 2014-10-23 MED ORDER — TECHNETIUM TO 99M ALBUMIN AGGREGATED
4.2000 | Freq: Once | INTRAVENOUS | Status: AC | PRN
  Administered 2014-10-23: 4 via INTRAVENOUS

## 2014-10-23 MED ORDER — SUCRALFATE 1 G PO TABS: 1.0000 g | ORAL_TABLET | Freq: Three times a day (TID) | ORAL | Status: DC

## 2014-10-23 MED ORDER — ONDANSETRON 4 MG PO TBDP: 4.0000 mg | ORAL_TABLET | Freq: Three times a day (TID) | ORAL | Status: DC | PRN

## 2014-10-23 MED ORDER — PANTOPRAZOLE SODIUM 40 MG IV SOLR
40.0000 mg | Freq: Once | INTRAVENOUS | Status: AC
  Administered 2014-10-23: 40 mg via INTRAVENOUS
  Filled 2014-10-23: qty 40

## 2014-10-23 MED ORDER — TECHNETIUM TC 99M DIETHYLENETRIAME-PENTAACETIC ACID: 30.6000 | Freq: Once | INTRAVENOUS | Status: DC | PRN

## 2014-10-23 MED ORDER — SODIUM CHLORIDE 0.9 % IV BOLUS (SEPSIS)
1000.0000 mL | Freq: Once | INTRAVENOUS | Status: AC
  Administered 2014-10-23: 1000 mL via INTRAVENOUS

## 2014-10-23 MED ORDER — PROMETHAZINE HCL 25 MG PO TABS: 25.0000 mg | ORAL_TABLET | Freq: Four times a day (QID) | ORAL | Status: DC | PRN

## 2014-10-23 MED ORDER — ONDANSETRON HCL 4 MG/2ML IJ SOLN
4.0000 mg | Freq: Once | INTRAMUSCULAR | Status: AC
  Administered 2014-10-23: 4 mg via INTRAVENOUS
  Filled 2014-10-23: qty 2

## 2014-10-23 NOTE — ED Notes (Signed)
SEE DOWNTIME DOCUMENTATION

## 2014-10-23 NOTE — ED Provider Notes (Signed)
By signing my name below, I, Altamease Oiler, attest that this documentation has been prepared under the direction and in the presence of [place], DO. Electronically Signed: Altamease Oiler, ED Scribe. 10/23/2014. 2:38 AM.  TIME SEEN: 1:10 AM  CHIEF COMPLAINT: dry cough  HPI: Susan Horton is a 44 y.o. female with PMHx of asthma, GERD, and DM who presents to the Emergency Department complaining of a persistent dry cough with onset 6 months ago. She states she was told that the cough was due to the use of Lisinopril that she stopped 6 months ago. Associated symptoms include a foul taste in the mouth (bitter, sour), chest pain (tightness), throat pain, nausea, vomiting, and generalized weakness. She is on 40 mg of Omeprazole that has provided insufficient relief at home. Her last EGD was in June and reportedly nothing was found. She has scheduled GI f/u on 11/15/14. No current anticoagulation. Does have prior history of a DVT. Denies any associated shortness of breath. No fever. No lower extremity swelling or pain. No abdominal pain. No diarrhea. No bloody stool or melena. States she's been to the emergency department several times for similar symptoms and states "I'm just tired of this". Former smoker. GI: Dr. Michail Sermon. PCP: Dr. Tarri Fuller  ROS: See HPI Constitutional: no fever  Eyes: no drainage  ENT: no runny nose   Cardiovascular:  chest pain  Resp: no SOB  GI: vomiting GU: no dysuria Integumentary: no rash  Allergy: no hives  Musculoskeletal: no leg swelling  Neurological: no slurred speech ROS otherwise negative  PAST MEDICAL HISTORY/PAST SURGICAL HISTORY:  Past Medical History  Diagnosis Date  . Hidradenitis   . Asthma     prn inhaler  . GERD (gastroesophageal reflux disease)   . Diabetes mellitus     IDDM  . Headache(784.0)     migraines  . DVT (deep venous thrombosis) ([facility])     20+ yrs ago (during pregnancy), 2015    MEDICATIONS:  Prior to Admission medications    Medication Sig Start Date End Date Taking? Authorizing Provider  alum & mag hydroxide-simeth (MAALOX/MYLANTA) 200-200-20 MG/5ML suspension Take 30 mLs by mouth 3 (three) times daily as needed for indigestion or heartburn. 07/26/14   Everlene Balls, MD  glucose blood (ONETOUCH VERIO) test strip Use to test blood sugar 4 times daily as instructed. Dx: E11.9 09/03/14   Philemon Kingdom, MD  insulin aspart (NOVOLOG FLEXPEN) 100 UNIT/ML FlexPen Inject 10-20 Units into the skin 3 (three) times daily with meals. 09/03/14   Philemon Kingdom, MD  Insulin Glargine (LANTUS) 100 UNIT/ML Solostar Pen Inject 25 Units into the skin daily at 10 pm. 06/06/14   Juliet Rude, MD  metoCLOPramide (REGLAN) 5 MG tablet Take 1 tablet (5 mg total) by mouth 3 (three) times daily before meals. 06/06/14   Carly Montey Hora, MD  ondansetron (ZOFRAN ODT) 8 MG disintegrating tablet Take 1 tablet (8 mg total) by mouth every 8 (eight) hours as needed for nausea or vomiting. 06/13/14   Linton Flemings, MD  [facility] DELICA LANCETS FINE MISC Use to test blood sugar 4 times daily as instructed. Dx: E11.9 09/03/14   Philemon Kingdom, MD  ranitidine (ZANTAC) 75 MG/5ML syrup Take 10 mLs (150 mg total) by mouth 2 (two) times daily. 07/26/14   Everlene Balls, MD  sucralfate (CARAFATE) 1 G tablet Take 1 tablet (1 g total) by mouth 4 (four) times daily. 06/13/14   Linton Flemings, MD    ALLERGIES:  Allergies  Allergen  Reactions  . Penicillins Anaphylaxis, Itching, Swelling and Rash    Swelling of throat and whole mouth     SOCIAL HISTORY:  Social History  Substance Use Topics  . Smoking status: Current Every Day Smoker -- 0.50 packs/day for 20 years    Types: Cigarettes  . Smokeless tobacco: Never Used     Comment: 1 pack/week  . Alcohol Use: No    FAMILY HISTORY: Family History  Problem Relation Age of Onset  . Hypertension Father   . Hypertension Sister   . Hypertension Brother     EXAM: BP 157/106 mmHg  Pulse 107  Temp(Src) 99.1 F (37.3 C)  (Oral)  Resp 18  Ht 5\' 5"  (1.651 m)  Wt 180 lb (81.647 kg)  BMI 29.95 kg/m2  SpO2 100%  CONSTITUTIONAL: Alert and oriented and responds appropriately to questions. Well-appearing; well-nourished, appears well-hydrated, afebrile, in no significant distress HEAD: Normocephalic EYES: Conjunctivae clear, PERRL ENT: normal nose; no rhinorrhea; moist mucous membranes; pharynx without lesions noted, no tonsillar hypertrophy or exudate, no uvular deviation, no trismus or drooling, normal phonation, no stridor NECK: Supple, no meningismus, no LAD  CARD: RRR; S1 and S2 appreciated; no murmurs, no clicks, no rubs, no gallops RESP: Normal chest excursion without splinting or tachypnea; breath sounds clear and equal bilaterally; no wheezes, no rhonchi, no rales, no hypoxia or respiratory distress, speaking full sentences ABD/GI: Normal bowel sounds; non-distended; soft, non-tender, no rebound, no guarding, no peritoneal signs, no tenderness at McBurney's point, negative Murphy sign BACK:  The back appears normal and is non-tender to palpation, there is no CVA tenderness EXT: Normal ROM in all joints; non-tender to palpation; no edema; normal capillary refill; no cyanosis, no calf tenderness or swelling    SKIN: Normal color for age and race; warm NEURO: Moves all extremities equally, sensation to light touch intact diffusely, cranial nerves II through XII intact PSYCH: The patient's mood and manner are appropriate. Grooming and personal hygiene are appropriate. MEDICAL DECISION MAKING: Patient here with complaints of 6 months of dry cough, later, sour taste in her mouth, sore throat, chest pain and vomiting. Patient has had an outpatient EGD that she reports was normal. She has had a CT of her abdomen and pelvis in July which was essentially normal. Labs ordered in triage show mild hyperglycemia and hyponatremia. Creatinine is slightly elevated from baseline but not significantly. We'll give IV fluids. First  troponin is negative. Chest x-ray is clear. Will repeat second troponin and obtain a CT image of patient's chest given history of previous DVT. We'll give GI cocktail, IV fluids and Zofran and reassess. Suspect some of her symptoms may be related to GERD, gastritis. Given normal LFTs and no right upper quadrant tenderness on exam likely cholelithiasis, cholecystitis. Lipase is also normal. No tenderness at McBurney's point to suggest appendicitis. Her abdominal exam is benign.  ED PROGRESS: Second troponin is negative. VQ scan shows very low probability for pulmonary embolus. Discussed with patient that I feel she is safe to be discharged home. She has received 2 L of IV fluids in the emergency department and has not had any further vomiting. She has been sleeping during most of her visit. We'll have her continue her omeprazole daily and discharge her with prescriptions for Zofran, Phenergan take as needed for nausea and vomiting as well as Carafate. Have recommended close outpatient with her gastroenterologist and her primary care provider. Discussed with patient that the emergency department may not be the appropriate place to  treat her chronic symptoms. Discussed return precautions. She verbalized understanding and is comfortable with plan.   EKG Interpretation  Date/Time:  Tuesday October 22 2014 21:00:46 EDT Ventricular Rate:  131 PR Interval:  142 QRS Duration: 78 QT Interval:  302 QTC Calculation: 445 R Axis:   70 Text Interpretation:  Sinus tachycardia Otherwise normal ECG Confirmed by Gryphon Vanderveen,  DO, Vara Mairena [id number]) on 10/23/2014 12:18:56 AM         I personally performed the services described in this documentation, which was scribed in my presence. The recorded information has been reviewed and is accurate.      [place], DO 10/23/14 [phone]

## 2014-10-23 NOTE — Discharge Instructions (Signed)
Gastroesophageal Reflux Disease, Adult Normally, food travels down the esophagus and stays in the stomach to be digested. However, when a person has gastroesophageal reflux disease (GERD), food and stomach acid move back up into the esophagus. When this happens, the esophagus becomes sore and inflamed. Over time, GERD can create small holes (ulcers) in the lining of the esophagus.  CAUSES This condition is caused by a problem with the muscle between the esophagus and the stomach (lower esophageal sphincter, or LES). Normally, the LES muscle closes after food passes through the esophagus to the stomach. When the LES is weakened or abnormal, it does not close properly, and that allows food and stomach acid to go back up into the esophagus. The LES can be weakened by certain dietary substances, medicines, and medical conditions, including:  Tobacco use.  Pregnancy.  Having a hiatal hernia.  Heavy alcohol use.  Certain foods and beverages, such as coffee, chocolate, onions, and peppermint. RISK FACTORS This condition is more likely to develop in:  People who have an increased body weight.  People who have connective tissue disorders.  People who use NSAID medicines. SYMPTOMS Symptoms of this condition include:  Heartburn.  Difficult or painful swallowing.  The feeling of having a lump in the throat.  Abitter taste in the mouth.  Bad breath.  Having a large amount of saliva.  Having an upset or bloated stomach.  Belching.  Chest pain.  Shortness of breath or wheezing.  Ongoing (chronic) cough or a night-time cough.  Wearing away of tooth enamel.  Weight loss. Different conditions can cause chest pain. Make sure to see your health care provider if you experience chest pain. DIAGNOSIS Your health care provider will take a medical history and perform a physical exam. To determine if you have mild or severe GERD, your health care provider may also monitor how you respond  to treatment. You may also have other tests, including:  An endoscopy toexamine your stomach and esophagus with a small camera.  A test thatmeasures the acidity level in your esophagus.  A test thatmeasures how much pressure is on your esophagus.  A barium swallow or modified barium swallow to show the shape, size, and functioning of your esophagus. TREATMENT The goal of treatment is to help relieve your symptoms and to prevent complications. Treatment for this condition may vary depending on how severe your symptoms are. Your health care provider may recommend:  Changes to your diet.  Medicine.  Surgery. HOME CARE INSTRUCTIONS Diet  Follow a diet as recommended by your health care provider. This may involve avoiding foods and drinks such as:  Coffee and tea (with or without caffeine).  Drinks that containalcohol.  Energy drinks and sports drinks.  Carbonated drinks or sodas.  Chocolate and cocoa.  Peppermint and mint flavorings.  Garlic and onions.  Horseradish.  Spicy and acidic foods, including peppers, chili powder, curry powder, vinegar, hot sauces, and barbecue sauce.  Citrus fruit juices and citrus fruits, such as oranges, lemons, and limes.  Tomato-based foods, such as red sauce, chili, salsa, and pizza with red sauce.  Fried and fatty foods, such as donuts, french fries, potato chips, and high-fat dressings.  High-fat meats, such as hot dogs and fatty cuts of red and white meats, such as rib eye steak, sausage, ham, and bacon.  High-fat dairy items, such as whole milk, butter, and cream cheese.  Eat small, frequent meals instead of large meals.  Avoid drinking large amounts of liquid with your  meals.  Avoid eating meals during the 2-3 hours before bedtime.  Avoid lying down right after you eat.  Do not exercise right after you eat. General Instructions  Pay attention to any changes in your symptoms.  Take over-the-counter and prescription  medicines only as told by your health care provider. Do not take aspirin, ibuprofen, or other NSAIDs unless your health care provider told you to do so.  Do not use any tobacco products, including cigarettes, chewing tobacco, and e-cigarettes. If you need help quitting, ask your health care provider.  Wear loose-fitting clothing. Do not wear anything tight around your waist that causes pressure on your abdomen.  Raise (elevate) the head of your bed 6 inches (15cm).  Try to reduce your stress, such as with yoga or meditation. If you need help reducing stress, ask your health care provider.  If you are overweight, reduce your weight to an amount that is healthy for you. Ask your health care provider for guidance about a safe weight loss goal.  Keep all follow-up visits as told by your health care provider. This is important. SEEK MEDICAL CARE IF:  You have new symptoms.  You have unexplained weight loss.  You have difficulty swallowing, or it hurts to swallow.  You have wheezing or a persistent cough.  Your symptoms do not improve with treatment.  You have a hoarse voice. SEEK IMMEDIATE MEDICAL CARE IF:  You have pain in your arms, neck, jaw, teeth, or back.  You feel sweaty, dizzy, or light-headed.  You have chest pain or shortness of breath.  You vomit and your vomit looks like blood or coffee grounds.  You faint.  Your stool is bloody or black.  You cannot swallow, drink, or eat.   This information is not intended to replace advice given to you by your health care provider. Make sure you discuss any questions you have with your health care provider.   Document Released: 10/07/2004 Document Revised: 09/18/2014 Document Reviewed: 04/24/2014 Elsevier Interactive Patient Education 2016 [place] for Gastroesophageal Reflux Disease, Adult When you have gastroesophageal reflux disease (GERD), the foods you eat and your eating habits are very important.  Choosing the right foods can help ease the discomfort of GERD. WHAT GENERAL GUIDELINES DO I NEED TO FOLLOW?  Choose fruits, vegetables, whole grains, low-fat dairy products, and low-fat meat, fish, and poultry.  Limit fats such as oils, salad dressings, butter, nuts, and avocado.  Keep a food diary to identify foods that cause symptoms.  Avoid foods that cause reflux. These may be different for different people.  Eat frequent small meals instead of three large meals each day.  Eat your meals slowly, in a relaxed setting.  Limit fried foods.  Cook foods using methods other than frying.  Avoid drinking alcohol.  Avoid drinking large amounts of liquids with your meals.  Avoid bending over or lying down until 2-3 hours after eating. WHAT FOODS ARE NOT RECOMMENDED? The following are some foods and drinks that may worsen your symptoms: Vegetables Tomatoes. Tomato juice. Tomato and spaghetti sauce. Chili peppers. Onion and garlic. Horseradish. Fruits Oranges, grapefruit, and lemon (fruit and juice). Meats High-fat meats, fish, and poultry. This includes hot dogs, ribs, ham, sausage, salami, and bacon. Dairy Whole milk and chocolate milk. Sour cream. Cream. Butter. Ice cream. Cream cheese.  Beverages Coffee and tea, with or without caffeine. Carbonated beverages or energy drinks. Condiments Hot sauce. Barbecue sauce.  Sweets/Desserts Chocolate and cocoa. Donuts. Peppermint and  spearmint. Fats and Oils High-fat foods, including [place] fries and potato chips. Other Vinegar. Strong spices, such as black pepper, white pepper, red pepper, cayenne, curry powder, cloves, ginger, and chili powder. The items listed above may not be a complete list of foods and beverages to avoid. Contact your dietitian for more information.   This information is not intended to replace advice given to you by your health care provider. Make sure you discuss any questions you have with your health care  provider.   Document Released: 12/28/2004 Document Revised: 01/18/2014 Document Reviewed: 11/01/2012 Elsevier Interactive Patient Education 2016 [employer]  Cough, Adult Coughing is a reflex that clears your throat and your airways. Coughing helps to heal and protect your lungs. It is normal to cough occasionally, but a cough that happens with other symptoms or lasts a long time may be a sign of a condition that needs treatment. A cough may last only 2-3 weeks (acute), or it may last longer than 8 weeks (chronic). CAUSES Coughing is commonly caused by:  Breathing in substances that irritate your lungs.  A viral or bacterial respiratory infection.  Allergies.  Asthma.  Postnasal drip.  Smoking.  Acid backing up from the stomach into the esophagus (gastroesophageal reflux).  Certain medicines.  Chronic lung problems, including COPD (or rarely, lung cancer).  Other medical conditions such as heart failure. HOME CARE INSTRUCTIONS  Pay attention to any changes in your symptoms. Take these actions to help with your discomfort:  Take medicines only as told by your health care provider.  If you were prescribed an antibiotic medicine, take it as told by your health care provider. Do not stop taking the antibiotic even if you start to feel better.  Talk with your health care provider before you take a cough suppressant medicine.  Drink enough fluid to keep your urine clear or pale yellow.  If the air is dry, use a cold steam vaporizer or humidifier in your bedroom or your home to help loosen secretions.  Avoid anything that causes you to cough at work or at home.  If your cough is worse at night, try sleeping in a semi-upright position.  Avoid cigarette smoke. If you smoke, quit smoking. If you need help quitting, ask your health care provider.  Avoid caffeine.  Avoid alcohol.  Rest as needed. SEEK MEDICAL CARE IF:   You have new symptoms.  You cough up pus.  Your  cough does not get better after 2-3 weeks, or your cough gets worse.  You cannot control your cough with suppressant medicines and you are losing sleep.  You develop pain that is getting worse or pain that is not controlled with pain medicines.  You have a fever.  You have unexplained weight loss.  You have night sweats. SEEK IMMEDIATE MEDICAL CARE IF:  You cough up blood.  You have difficulty breathing.  Your heartbeat is very fast.   This information is not intended to replace advice given to you by your health care provider. Make sure you discuss any questions you have with your health care provider.   Document Released: 06/26/2010 Document Revised: 09/18/2014 Document Reviewed: 03/06/2014 Elsevier Interactive Patient Education 2016 [employer] Inc[employer]  Nausea and Vomiting Nausea is a sick feeling that often comes before throwing up (vomiting). Vomiting is a reflex where stomach contents come out of your mouth. Vomiting can cause severe loss of body fluids (dehydration). Children and elderly adults can become dehydrated quickly, especially if they also have diarrhea.  Nausea and vomiting are symptoms of a condition or disease. It is important to find the cause of your symptoms. CAUSES   Direct irritation of the stomach lining. This irritation can result from increased acid production (gastroesophageal reflux disease), infection, food poisoning, taking certain medicines (such as nonsteroidal anti-inflammatory drugs), alcohol use, or tobacco use.  Signals from the brain.These signals could be caused by a headache, heat exposure, an inner ear disturbance, increased pressure in the brain from injury, infection, a tumor, or a concussion, pain, emotional stimulus, or metabolic problems.  An obstruction in the gastrointestinal tract (bowel obstruction).  Illnesses such as diabetes, hepatitis, gallbladder problems, appendicitis, kidney problems, cancer, sepsis, atypical symptoms of a heart  attack, or eating disorders.  Medical treatments such as chemotherapy and radiation.  Receiving medicine that makes you sleep (general anesthetic) during surgery. DIAGNOSIS Your caregiver may ask for tests to be done if the problems do not improve after a few days. Tests may also be done if symptoms are severe or if the reason for the nausea and vomiting is not clear. Tests may include:  Urine tests.  Blood tests.  Stool tests.  Cultures (to look for evidence of infection).  X-rays or other imaging studies. Test results can help your caregiver make decisions about treatment or the need for additional tests. TREATMENT You need to stay well hydrated. Drink frequently but in small amounts.You may wish to drink water, sports drinks, clear broth, or eat frozen ice pops or gelatin dessert to help stay hydrated.When you eat, eating slowly may help prevent nausea.There are also some antinausea medicines that may help prevent nausea. HOME CARE INSTRUCTIONS   Take all medicine as directed by your caregiver.  If you do not have an appetite, do not force yourself to eat. However, you must continue to drink fluids.  If you have an appetite, eat a normal diet unless your caregiver tells you differently.  Eat a variety of complex carbohydrates (rice, wheat, potatoes, bread), lean meats, yogurt, fruits, and vegetables.  Avoid high-fat foods because they are more difficult to digest.  Drink enough water and fluids to keep your urine clear or pale yellow.  If you are dehydrated, ask your caregiver for specific rehydration instructions. Signs of dehydration may include:  Severe thirst.  Dry lips and mouth.  Dizziness.  Dark urine.  Decreasing urine frequency and amount.  Confusion.  Rapid breathing or pulse. SEEK IMMEDIATE MEDICAL CARE IF:   You have blood or brown flecks (like coffee grounds) in your vomit.  You have black or bloody stools.  You have a severe headache or stiff  neck.  You are confused.  You have severe abdominal pain.  You have chest pain or trouble breathing.  You do not urinate at least once every 8 hours.  You develop cold or clammy skin.  You continue to vomit for longer than 24 to 48 hours.  You have a fever. MAKE SURE YOU:   Understand these instructions.  Will watch your condition.  Will get help right away if you are not doing well or get worse.   This information is not intended to replace advice given to you by your health care provider. Make sure you discuss any questions you have with your health care provider.   Document Released: 12/28/2004 Document Revised: 03/22/2011 Document Reviewed: 05/27/2010 Elsevier Interactive Patient Education 2016 [employer] Inc[employer]  Nonspecific Chest Pain  Chest pain can be caused by many different conditions. There is always a chance that  your pain could be related to something serious, such as a heart attack or a blood clot in your lungs. Chest pain can also be caused by conditions that are not life-threatening. If you have chest pain, it is very important to follow up with your health care provider. CAUSES  Chest pain can be caused by:  Heartburn.  Pneumonia or bronchitis.  Anxiety or stress.  Inflammation around your heart (pericarditis) or lung (pleuritis or pleurisy).  A blood clot in your lung.  A collapsed lung (pneumothorax). It can develop suddenly on its own (spontaneous pneumothorax) or from trauma to the chest.  Shingles infection (varicella-zoster virus).  Heart attack.  Damage to the bones, muscles, and cartilage that make up your chest wall. This can include:  Bruised bones due to injury.  Strained muscles or cartilage due to frequent or repeated coughing or overwork.  Fracture to one or more ribs.  Sore cartilage due to inflammation (costochondritis). RISK FACTORS  Risk factors for chest pain may include:  Activities that increase your risk for trauma or  injury to your chest.  Respiratory infections or conditions that cause frequent coughing.  Medical conditions or overeating that can cause heartburn.  Heart disease or family history of heart disease.  Conditions or health behaviors that increase your risk of developing a blood clot.  Having had chicken pox (varicella zoster). SIGNS AND SYMPTOMS Chest pain can feel like:  Burning or tingling on the surface of your chest or deep in your chest.  Crushing, pressure, aching, or squeezing pain.  Dull or sharp pain that is worse when you move, cough, or take a deep breath.  Pain that is also felt in your back, neck, shoulder, or arm, or pain that spreads to any of these areas. Your chest pain may come and go, or it may stay constant. DIAGNOSIS Lab tests or other studies may be needed to find the cause of your pain. Your health care provider may have you take a test called an ambulatory ECG (electrocardiogram). An ECG records your heartbeat patterns at the time the test is performed. You may also have other tests, such as:  Transthoracic echocardiogram (TTE). During echocardiography, sound waves are used to create a picture of all of the heart structures and to look at how blood flows through your heart.  Transesophageal echocardiogram (TEE).This is a more advanced imaging test that obtains images from inside your body. It allows your health care provider to see your heart in finer detail.  Cardiac monitoring. This allows your health care provider to monitor your heart rate and rhythm in real time.  Holter monitor. This is a portable device that records your heartbeat and can help to diagnose abnormal heartbeats. It allows your health care provider to track your heart activity for several days, if needed.  Stress tests. These can be done through exercise or by taking medicine that makes your heart beat more quickly.  Blood tests.  Imaging tests. TREATMENT  Your treatment depends on  what is causing your chest pain. Treatment may include:  Medicines. These may include:  Acid blockers for heartburn.  Anti-inflammatory medicine.  Pain medicine for inflammatory conditions.  Antibiotic medicine, if an infection is present.  Medicines to dissolve blood clots.  Medicines to treat coronary artery disease.  Supportive care for conditions that do not require medicines. This may include:  Resting.  Applying heat or cold packs to injured areas.  Limiting activities until pain decreases. HOME CARE INSTRUCTIONS  If you  were prescribed an antibiotic medicine, finish it all even if you start to feel better.  Avoid any activities that bring on chest pain.  Do not use any tobacco products, including cigarettes, chewing tobacco, or electronic cigarettes. If you need help quitting, ask your health care provider.  Do not drink alcohol.  Take medicines only as directed by your health care provider.  Keep all follow-up visits as directed by your health care provider. This is important. This includes any further testing if your chest pain does not go away.  If heartburn is the cause for your chest pain, you may be told to keep your head raised (elevated) while sleeping. This reduces the chance that acid will go from your stomach into your esophagus.  Make lifestyle changes as directed by your health care provider. These may include:  Getting regular exercise. Ask your health care provider to suggest some activities that are safe for you.  Eating a heart-healthy diet. A registered dietitian can help you to learn healthy eating options.  Maintaining a healthy weight.  Managing diabetes, if necessary.  Reducing stress. SEEK MEDICAL CARE IF:  Your chest pain does not go away after treatment.  You have a rash with blisters on your chest.  You have a fever. SEEK IMMEDIATE MEDICAL CARE IF:   Your chest pain is worse.  You have an increasing cough, or you cough up  blood.  You have severe abdominal pain.  You have severe weakness.  You faint.  You have chills.  You have sudden, unexplained chest discomfort.  You have sudden, unexplained discomfort in your arms, back, neck, or jaw.  You have shortness of breath at any time.  You suddenly start to sweat, or your skin gets clammy.  You feel nauseous or you vomit.  You suddenly feel light-headed or dizzy.  Your heart begins to beat quickly, or it feels like it is skipping beats. These symptoms may represent a serious problem that is an emergency. Do not wait to see if the symptoms will go away. Get medical help right away. Call your local emergency services (911 in the [place] Do not drive yourself to the hospital.   This information is not intended to replace advice given to you by your health care provider. Make sure you discuss any questions you have with your health care provider.   Document Released: 10/07/2004 Document Revised: 01/18/2014 Document Reviewed: 08/03/2013 Elsevier Interactive Patient Education [employer].

## 2014-11-12 ENCOUNTER — Other Ambulatory Visit: Payer: Self-pay | Admitting: Gastroenterology

## 2014-11-12 DIAGNOSIS — R112 Nausea with vomiting, unspecified: Secondary | ICD-10-CM

## 2014-11-12 DIAGNOSIS — R1084 Generalized abdominal pain: Secondary | ICD-10-CM

## 2014-11-20 ENCOUNTER — Ambulatory Visit
Admission: RE | Admit: 2014-11-20 | Discharge: 2014-11-20 | Disposition: A | Discharge status: Home / Self Care | Payer: 59 | Source: Ambulatory Visit | Attending: Gastroenterology | Admitting: Gastroenterology

## 2014-11-20 DIAGNOSIS — R112 Nausea with vomiting, unspecified: Secondary | ICD-10-CM

## 2014-11-20 DIAGNOSIS — R1084 Generalized abdominal pain: Secondary | ICD-10-CM

## 2014-12-23 ENCOUNTER — Emergency Department (HOSPITAL_COMMUNITY)
Admission: EM | Admit: 2014-12-23 | Discharge: 2014-12-23 | Discharge status: Against Medical Advice | Payer: 59 | Source: Home / Self Care

## 2014-12-23 ENCOUNTER — Encounter (HOSPITAL_COMMUNITY): Payer: Self-pay | Admitting: Emergency Medicine

## 2014-12-23 DIAGNOSIS — R519 Headache, unspecified: Secondary | ICD-10-CM

## 2014-12-23 DIAGNOSIS — R51 Headache: Secondary | ICD-10-CM

## 2014-12-23 NOTE — ED Notes (Signed)
Pt here with c/o migraine headache to left side of face x 2 weeks Denies cold/cough, sinus pressure Taking sinus and tylenol medication OTC Hx HTN, Diabetes, compliant with medication

## 2014-12-23 NOTE — ED Provider Notes (Signed)
CSN: [id number]     Arrival date & time 12/23/14  1748 History   None    No chief complaint on file.  (Consider location/radiation/quality/duration/timing/severity/associated sxs/prior Treatment) HPI Feels as if she has been punched in the face and head. Different from routine migraine. Sinus meds tylenol advil helps a bit. Pain score 10. Chronic vomiting, cough for 6 months.  Past Medical History  Diagnosis Date  . Hidradenitis   . Asthma     prn inhaler  . GERD (gastroesophageal reflux disease)   . Diabetes mellitus     IDDM  . Headache(784.0)     migraines  . DVT (deep venous thrombosis) ([place])     20+ yrs ago (during pregnancy), 2015   Past Surgical History  Procedure Laterality Date  . Tonsillectomy    . Inguinal hidradenitis excision  07/06/2010    bilateral  . Exploratory laparotomy  08/14/2005    lysis of adhesions, drainage of tubo-ovarian abscess  . Breast reduction surgery  2002  . Appendectomy  1995  . Cholecystectomy  1993  . Tubal ligation  1996  . Vaginal hysterectomy  08/04/2005    and cysto  . Hydradenitis excision  02/08/2011    Procedure: EXCISION HYDRADENITIS GROIN;  Surgeon: [name], MD;  Location: [place];  Service: General;  Laterality: N/A;  Excisioin of Hidradenitis Left groin  . Tummy tuck     Family History  Problem Relation Age of Onset  . Hypertension Father   . Hypertension Sister   . Hypertension Brother    Social History  Substance Use Topics  . Smoking status: Current Every Day Smoker -- 0.50 packs/day for 20 years    Types: Cigarettes  . Smokeless tobacco: Never Used     Comment: 1 pack/week  . Alcohol Use: No   OB History    No data available     Review of Systems ROS +'ve Headache  Denies:   NAUSEA, ABDOMINAL PAIN, CHEST PAIN, CONGESTION, DYSURIA, SHORTNESS OF BREATH  Allergies  Penicillins  Home Medications   Prior to Admission medications   Medication Sig Start Date End Date Taking?  Authorizing Provider  albuterol (PROVENTIL HFA;VENTOLIN HFA) 108 (90 BASE) MCG/ACT inhaler Inhale 1 puff into the lungs every 6 (six) hours as needed for wheezing or shortness of breath.    Historical Provider, MD  Fluticasone-Salmeterol (ADVAIR) 250-50 MCG/DOSE AEPB Inhale 1 puff into the lungs 2 (two) times daily.    Historical Provider, MD  glucose blood (ONETOUCH VERIO) test strip Use to test blood sugar 4 times daily as instructed. Dx: E11.9 09/03/14   Philemon Kingdom, MD  insulin aspart (NOVOLOG FLEXPEN) 100 UNIT/ML FlexPen Inject 10-20 Units into the skin 3 (three) times daily with meals. 09/03/14   Philemon Kingdom, MD  Insulin Glargine (LANTUS) 100 UNIT/ML Solostar Pen Inject 25 Units into the skin daily at 10 pm. Patient taking differently: Inject 25 Units into the skin 2 (two) times daily.  06/06/14   Juliet Rude, MD  losartan (COZAAR) 100 MG tablet Take 100 mg by mouth daily.    Historical Provider, MD  lovastatin (MEVACOR) 20 MG tablet Take 20 mg by mouth daily.    Historical Provider, MD  metoCLOPramide (REGLAN) 10 MG tablet Take 10 mg by mouth 3 (three) times daily before meals.    Historical Provider, MD  montelukast (SINGULAIR) 10 MG tablet Take 10 mg by mouth at bedtime.    Historical Provider, MD  omeprazole (PRILOSEC) 40 MG  capsule Take 40 mg by mouth daily.    Historical Provider, MD  ondansetron (ZOFRAN ODT) 4 MG disintegrating tablet Take 1 tablet (4 mg total) by mouth every 8 (eight) hours as needed for nausea or vomiting. 10/23/14   Kristen N Ward, DO  ONETOUCH DELICA LANCETS FINE MISC Use to test blood sugar 4 times daily as instructed. Dx: E11.9 09/03/14   Philemon Kingdom, MD  promethazine (PHENERGAN) 25 MG tablet Take 1 tablet (25 mg total) by mouth every 6 (six) hours as needed for nausea or vomiting. 10/23/14   Kristen N Ward, DO  sucralfate (CARAFATE) 1 G tablet Take 1 tablet (1 g total) by mouth 4 (four) times daily -  with meals and at bedtime. 10/23/14   Kristen N  Ward, DO  sulfamethoxazole-trimethoprim (BACTRIM DS,SEPTRA DS) 800-160 MG tablet Take 1 tablet by mouth 2 (two) times daily.    Historical Provider, MD   Meds Ordered and Administered this Visit  Medications - No data to display  There were no vitals taken for this visit. No data found.   Physical Exam  Constitutional: She is oriented to person, place, and time. She appears well-developed and well-nourished.  HENT:  Head: Normocephalic and atraumatic.  Eyes: Conjunctivae are normal.  Neck: Normal range of motion.  Pulmonary/Chest: Effort normal and breath sounds normal.  Musculoskeletal: Normal range of motion.  Neurological: She is alert and oriented to person, place, and time.  Skin: Skin is warm and dry.  Psychiatric: She has a normal mood and affect. Her behavior is normal. Thought content normal.    ED Course  Procedures (including critical care time)  Labs Review Labs Reviewed - No data to display  Imaging Review No results found.   Visual Acuity Review  Right Eye Distance:   Left Eye Distance:   Bilateral Distance:    Right Eye Near:   Left Eye Near:    Bilateral Near:         MDM   1. Headache, unspecified headache type    Patient has a unilateral right-sided head pain. I have advised the patient that we are not able to do imaging studies to the urgent care and that she may need to go to the emergency department. I offered for IM Toradol ketorolac the patient has declined. When preparing discharge instructions patient left the urgent care without discharge instructions in hand.     THIS NOTE WAS GENERATED USING A VOICE RECOGNITION SOFTWARE PROGRAM. ALL REASONABLE EFFORTS  WERE MADE TO PROOFREAD THIS DOCUMENT FOR ACCURACY.     Konrad Felix, [place] 12/23/14 2028

## 2014-12-23 NOTE — Discharge Instructions (Signed)
The urgent care is unable to do CT scan of your head. Since your symptoms have  been present for approximately 2 weeks now and is unilateral,you most likely will need further workup and evaluation in the emergency department at [facility].  It is also advised that you contact your primary doctor to arrange follow up.

## 2015-01-01 ENCOUNTER — Emergency Department (HOSPITAL_COMMUNITY)
Admission: EM | Admit: 2015-01-01 | Discharge: 2015-01-02 | Disposition: A | Discharge status: Home / Self Care | Payer: 59 | Attending: Emergency Medicine | Admitting: Emergency Medicine

## 2015-01-01 ENCOUNTER — Encounter (HOSPITAL_COMMUNITY): Payer: Self-pay | Admitting: *Deleted

## 2015-01-01 DIAGNOSIS — Z872 Personal history of diseases of the skin and subcutaneous tissue: Secondary | ICD-10-CM | POA: Insufficient documentation

## 2015-01-01 DIAGNOSIS — J45909 Unspecified asthma, uncomplicated: Secondary | ICD-10-CM | POA: Insufficient documentation

## 2015-01-01 DIAGNOSIS — Z880 Allergy status to penicillin: Secondary | ICD-10-CM | POA: Insufficient documentation

## 2015-01-01 DIAGNOSIS — Z792 Long term (current) use of antibiotics: Secondary | ICD-10-CM | POA: Diagnosis not present

## 2015-01-01 DIAGNOSIS — K219 Gastro-esophageal reflux disease without esophagitis: Secondary | ICD-10-CM | POA: Diagnosis not present

## 2015-01-01 DIAGNOSIS — Z7951 Long term (current) use of inhaled steroids: Secondary | ICD-10-CM | POA: Insufficient documentation

## 2015-01-01 DIAGNOSIS — R51 Headache: Secondary | ICD-10-CM | POA: Diagnosis present

## 2015-01-01 DIAGNOSIS — Z794 Long term (current) use of insulin: Secondary | ICD-10-CM | POA: Insufficient documentation

## 2015-01-01 DIAGNOSIS — Z86718 Personal history of other venous thrombosis and embolism: Secondary | ICD-10-CM | POA: Insufficient documentation

## 2015-01-01 DIAGNOSIS — Z79899 Other long term (current) drug therapy: Secondary | ICD-10-CM | POA: Diagnosis not present

## 2015-01-01 DIAGNOSIS — R739 Hyperglycemia, unspecified: Secondary | ICD-10-CM

## 2015-01-01 DIAGNOSIS — E1165 Type 2 diabetes mellitus with hyperglycemia: Secondary | ICD-10-CM | POA: Insufficient documentation

## 2015-01-01 DIAGNOSIS — F17210 Nicotine dependence, cigarettes, uncomplicated: Secondary | ICD-10-CM | POA: Insufficient documentation

## 2015-01-01 DIAGNOSIS — R519 Headache, unspecified: Secondary | ICD-10-CM

## 2015-01-01 LAB — CBC WITH DIFFERENTIAL/PLATELET
BASOS ABS: 0 10*3/uL (ref 0.0–0.1)
BASOS PCT: 0 %
EOS ABS: 0.1 10*3/uL (ref 0.0–0.7)
EOS PCT: 1 %
HCT: 27.6 % — ABNORMAL LOW (ref 36.0–46.0)
Hemoglobin: 8.4 g/dL — ABNORMAL LOW (ref 12.0–15.0)
Lymphocytes Relative: 32 %
Lymphs Abs: 3 10*3/uL (ref 0.7–4.0)
MCH: 24.1 pg — ABNORMAL LOW (ref 26.0–34.0)
MCHC: 30.4 g/dL (ref 30.0–36.0)
MCV: 79.1 fL (ref 78.0–100.0)
MONO ABS: 0.3 10*3/uL (ref 0.1–1.0)
Monocytes Relative: 3 %
NEUTROS ABS: 6.1 10*3/uL (ref 1.7–7.7)
Neutrophils Relative %: 64 %
PLATELETS: 386 10*3/uL (ref 150–400)
RBC: 3.49 MIL/uL — ABNORMAL LOW (ref 3.87–5.11)
RDW: 12.7 % (ref 11.5–15.5)
WBC: 9.6 10*3/uL (ref 4.0–10.5)

## 2015-01-01 LAB — BASIC METABOLIC PANEL
ANION GAP: 8 (ref 5–15)
BUN: 32 mg/dL — ABNORMAL HIGH (ref 6–20)
CALCIUM: 8.7 mg/dL — AB (ref 8.9–10.3)
CO2: 25 mmol/L (ref 22–32)
Chloride: 100 mmol/L — ABNORMAL LOW (ref 101–111)
Creatinine, Ser: 1.81 mg/dL — ABNORMAL HIGH (ref 0.44–1.00)
GFR, EST AFRICAN AMERICAN: 38 mL/min — AB (ref >60)
GFR, EST NON AFRICAN AMERICAN: 33 mL/min — AB (ref >60)
Glucose, Bld: 476 mg/dL — ABNORMAL HIGH (ref 65–99)
Potassium: 4.1 mmol/L (ref 3.5–5.1)
Sodium: 133 mmol/L — ABNORMAL LOW (ref 135–145)

## 2015-01-01 LAB — CBG MONITORING, ED
GLUCOSE-CAPILLARY: 282 mg/dL — AB (ref 65–99)
Glucose-Capillary: 381 mg/dL — ABNORMAL HIGH (ref 65–99)

## 2015-01-01 MED ORDER — SODIUM CHLORIDE 0.9 % IV BOLUS (SEPSIS)
1000.0000 mL | Freq: Once | INTRAVENOUS | Status: AC
  Administered 2015-01-01: 1000 mL via INTRAVENOUS

## 2015-01-01 MED ORDER — PROCHLORPERAZINE EDISYLATE 5 MG/ML IJ SOLN
10.0000 mg | Freq: Once | INTRAMUSCULAR | Status: AC
Start: 2015-01-01 — End: 2015-01-01
  Administered 2015-01-01: 10 mg via INTRAVENOUS
  Filled 2015-01-01: qty 2

## 2015-01-01 MED ORDER — SODIUM CHLORIDE 0.9 % IV BOLUS (SEPSIS): 1000.0000 mL | Freq: Once | INTRAVENOUS | Status: DC

## 2015-01-01 MED ORDER — DIPHENHYDRAMINE HCL 50 MG/ML IJ SOLN
25.0000 mg | Freq: Once | INTRAMUSCULAR | Status: AC
  Administered 2015-01-01: 25 mg via INTRAVENOUS
  Filled 2015-01-01: qty 1

## 2015-01-01 MED ORDER — INSULIN ASPART 100 UNIT/ML ~~LOC~~ SOLN
10.0000 [IU] | Freq: Once | SUBCUTANEOUS | Status: AC
  Administered 2015-01-01: 10 [IU] via INTRAVENOUS
  Filled 2015-01-01: qty 1

## 2015-01-01 MED ORDER — DEXAMETHASONE SODIUM PHOSPHATE 10 MG/ML IJ SOLN
10.0000 mg | Freq: Once | INTRAMUSCULAR | Status: AC
  Administered 2015-01-01: 10 mg via INTRAVENOUS
  Filled 2015-01-01: qty 1

## 2015-01-01 NOTE — Discharge Instructions (Signed)

## 2015-01-01 NOTE — ED Provider Notes (Signed)
CSN: [id number]     Arrival date & time 01/01/15  2023 History   First MD Initiated Contact with Patient 01/01/15 2110     Chief Complaint  Patient presents with  . Headache     (Consider location/radiation/quality/duration/timing/severity/associated sxs/prior Treatment) Patient is a 44 y.o. female presenting with headaches. The history is provided by the patient.  Headache Pain location:  L parietal and L temporal Quality: aching. Radiates to:  Does not radiate Severity currently:  10/10 Severity at highest:  10/10 Onset quality:  Gradual Duration:  2 weeks Timing:  Constant Progression:  Unchanged Chronicity:  New Similar to prior headaches: yes   Context: activity   Relieved by:  Nothing Worsened by:  Nothing Ineffective treatments:  None tried Associated symptoms: no dizziness, no fever, no loss of balance and no visual change     Past Medical History  Diagnosis Date  . Hidradenitis   . Asthma     prn inhaler  . GERD (gastroesophageal reflux disease)   . Diabetes mellitus     IDDM  . Headache(784.0)     migraines  . DVT (deep venous thrombosis) ([place])     20+ yrs ago (during pregnancy), 2015   Past Surgical History  Procedure Laterality Date  . Tonsillectomy    . Inguinal hidradenitis excision  07/06/2010    bilateral  . Exploratory laparotomy  08/14/2005    lysis of adhesions, drainage of tubo-ovarian abscess  . Breast reduction surgery  2002  . Appendectomy  1995  . Cholecystectomy  1993  . Tubal ligation  1996  . Vaginal hysterectomy  08/04/2005    and cysto  . Hydradenitis excision  02/08/2011    Procedure: EXCISION HYDRADENITIS GROIN;  Surgeon: [name], MD;  Location: [place];  Service: General;  Laterality: N/A;  Excisioin of Hidradenitis Left groin  . Tummy tuck     Family History  Problem Relation Age of Onset  . Hypertension Father   . Hypertension Sister   . Hypertension Brother    Social History  Substance Use  Topics  . Smoking status: Current Every Day Smoker -- 0.50 packs/day for 20 years    Types: Cigarettes  . Smokeless tobacco: Never Used     Comment: 1 pack/week  . Alcohol Use: No   OB History    No data available     Review of Systems  Constitutional: Negative for fever.  Neurological: Positive for headaches. Negative for dizziness and loss of balance.  All other systems reviewed and are negative.     Allergies  Penicillins  Home Medications   Prior to Admission medications   Medication Sig Start Date End Date Taking? Authorizing Provider  albuterol (PROVENTIL HFA;VENTOLIN HFA) 108 (90 BASE) MCG/ACT inhaler Inhale 1 puff into the lungs every 6 (six) hours as needed for wheezing or shortness of breath.    Historical Provider, MD  Fluticasone-Salmeterol (ADVAIR) 250-50 MCG/DOSE AEPB Inhale 1 puff into the lungs 2 (two) times daily.    Historical Provider, MD  glucose blood (ONETOUCH VERIO) test strip Use to test blood sugar 4 times daily as instructed. Dx: E11.9 09/03/14   Philemon Kingdom, MD  insulin aspart (NOVOLOG FLEXPEN) 100 UNIT/ML FlexPen Inject 10-20 Units into the skin 3 (three) times daily with meals. 09/03/14   Philemon Kingdom, MD  Insulin Glargine (LANTUS) 100 UNIT/ML Solostar Pen Inject 25 Units into the skin daily at 10 pm. Patient taking differently: Inject 25 Units into the skin 2 (  two) times daily.  06/06/14   Juliet Rude, MD  losartan (COZAAR) 100 MG tablet Take 100 mg by mouth daily.    Historical Provider, MD  lovastatin (MEVACOR) 20 MG tablet Take 20 mg by mouth daily.    Historical Provider, MD  metoCLOPramide (REGLAN) 10 MG tablet Take 10 mg by mouth 3 (three) times daily before meals.    Historical Provider, MD  montelukast (SINGULAIR) 10 MG tablet Take 10 mg by mouth at bedtime.    Historical Provider, MD  omeprazole (PRILOSEC) 40 MG capsule Take 40 mg by mouth daily.    Historical Provider, MD  ondansetron (ZOFRAN ODT) 4 MG disintegrating tablet Take 1  tablet (4 mg total) by mouth every 8 (eight) hours as needed for nausea or vomiting. 10/23/14   Kristen N Ward, DO  ONETOUCH DELICA LANCETS FINE MISC Use to test blood sugar 4 times daily as instructed. Dx: E11.9 09/03/14   Philemon Kingdom, MD  promethazine (PHENERGAN) 25 MG tablet Take 1 tablet (25 mg total) by mouth every 6 (six) hours as needed for nausea or vomiting. 10/23/14   Kristen N Ward, DO  sucralfate (CARAFATE) 1 G tablet Take 1 tablet (1 g total) by mouth 4 (four) times daily -  with meals and at bedtime. 10/23/14   Kristen N Ward, DO  sulfamethoxazole-trimethoprim (BACTRIM DS,SEPTRA DS) 800-160 MG tablet Take 1 tablet by mouth 2 (two) times daily.    Historical Provider, MD   BP 199/106 mmHg  Pulse 107  Temp(Src) 98.8 F (37.1 C) (Oral)  Resp 20  Ht 5\' 5"  (1.651 m)  Wt 179 lb (81.194 kg)  BMI 29.79 kg/m2  SpO2 98% Physical Exam  Constitutional: She is oriented to person, place, and time. She appears well-developed and well-nourished. No distress.  HENT:  Head: Normocephalic.  Eyes: Conjunctivae are normal.  Neck: Neck supple. No tracheal deviation present.  Cardiovascular: Normal rate, regular rhythm and normal heart sounds.   Pulmonary/Chest: Effort normal and breath sounds normal. No respiratory distress.  Abdominal: Soft. She exhibits no distension.  Neurological: She is alert and oriented to person, place, and time. She has normal strength. No cranial nerve deficit. Coordination normal. GCS eye subscore is 4. GCS verbal subscore is 5. GCS motor subscore is 6.  Normal finger to nose testing and rapid alternating movement   Skin: Skin is warm and dry.  Psychiatric: She has a normal mood and affect.    ED Course  Procedures (including critical care time) Labs Review Labs Reviewed  CBC WITH DIFFERENTIAL/PLATELET - Abnormal; Notable for the following:    RBC 3.49 (*)    Hemoglobin 8.4 (*)    HCT 27.6 (*)    MCH 24.1 (*)    All other components within normal limits   BASIC METABOLIC PANEL - Abnormal; Notable for the following:    Sodium 133 (*)    Chloride 100 (*)    Glucose, Bld 476 (*)    BUN 32 (*)    Creatinine, Ser 1.81 (*)    Calcium 8.7 (*)    GFR calc non Af Amer 33 (*)    GFR calc Af Amer 38 (*)    All other components within normal limits  CBG MONITORING, ED - Abnormal; Notable for the following:    Glucose-Capillary 381 (*)    All other components within normal limits  CBG MONITORING, ED - Abnormal; Notable for the following:    Glucose-Capillary 282 (*)    All other components  within normal limits    Imaging Review No results found. I have personally reviewed and evaluated these images and lab results as part of my medical decision-making.   EKG Interpretation None      MDM   Final diagnoses:  Nonintractable headache, unspecified chronicity pattern, unspecified headache type  Hyperglycemia without ketosis    44 y.o. female presents with migraine headache that is similar to prior starting 2 weeks ago. Not responding to supportive care measures at home. No neurologic deficits here and otherwise well appearing despite discomfort. No red flag symptoms for headache, no signs or symptoms of spontaneous ICH. No indication for imaging currently. Provided migraine cocktail with good relief of symptoms. Hyperglycemia 2/2 noncompliance corrected with single IV insulin admin and recommended close monitoring of glucose at home. Plan to follow up with PCP as needed and return precautions discussed for worsening or new concerning symptoms.     Leo Grosser, MD 01/02/15 [phone]

## 2015-01-01 NOTE — ED Notes (Signed)
CBG 282 

## 2015-01-01 NOTE — ED Notes (Signed)
Patient presents stating she has had pain to the left side of her head for 2 weeks.  Stated she thought it was from her constant vomiting ( found out why and is going to need surgery)  Stated the pain in her head is different from her migraines  Also her BP is elevated

## 2015-01-29 ENCOUNTER — Other Ambulatory Visit: Payer: Self-pay | Admitting: Nurse Practitioner

## 2015-01-29 DIAGNOSIS — R921 Mammographic calcification found on diagnostic imaging of breast: Secondary | ICD-10-CM

## 2015-02-17 ENCOUNTER — Ambulatory Visit
Admission: RE | Admit: 2015-02-17 | Discharge: 2015-02-17 | Disposition: A | Discharge status: Home / Self Care | Payer: 59 | Source: Ambulatory Visit | Attending: Nurse Practitioner | Admitting: Nurse Practitioner

## 2015-02-17 DIAGNOSIS — R921 Mammographic calcification found on diagnostic imaging of breast: Secondary | ICD-10-CM

## 2015-03-04 DIAGNOSIS — R432 Parageusia: Secondary | ICD-10-CM | POA: Insufficient documentation

## 2015-03-04 DIAGNOSIS — R519 Headache, unspecified: Secondary | ICD-10-CM | POA: Insufficient documentation

## 2015-03-17 ENCOUNTER — Emergency Department (HOSPITAL_COMMUNITY)
Admission: EM | Admit: 2015-03-17 | Discharge: 2015-03-18 | Disposition: A | Discharge status: Home / Self Care | Payer: 59 | Attending: Emergency Medicine | Admitting: Emergency Medicine

## 2015-03-17 ENCOUNTER — Encounter (HOSPITAL_COMMUNITY): Payer: Self-pay | Admitting: *Deleted

## 2015-03-17 DIAGNOSIS — D649 Anemia, unspecified: Secondary | ICD-10-CM

## 2015-03-17 DIAGNOSIS — Z880 Allergy status to penicillin: Secondary | ICD-10-CM | POA: Diagnosis not present

## 2015-03-17 DIAGNOSIS — E119 Type 2 diabetes mellitus without complications: Secondary | ICD-10-CM | POA: Insufficient documentation

## 2015-03-17 DIAGNOSIS — Z794 Long term (current) use of insulin: Secondary | ICD-10-CM | POA: Diagnosis not present

## 2015-03-17 DIAGNOSIS — Z872 Personal history of diseases of the skin and subcutaneous tissue: Secondary | ICD-10-CM | POA: Insufficient documentation

## 2015-03-17 DIAGNOSIS — B029 Zoster without complications: Secondary | ICD-10-CM | POA: Insufficient documentation

## 2015-03-17 DIAGNOSIS — Z79899 Other long term (current) drug therapy: Secondary | ICD-10-CM | POA: Diagnosis not present

## 2015-03-17 DIAGNOSIS — R21 Rash and other nonspecific skin eruption: Secondary | ICD-10-CM | POA: Diagnosis present

## 2015-03-17 DIAGNOSIS — F17210 Nicotine dependence, cigarettes, uncomplicated: Secondary | ICD-10-CM | POA: Diagnosis not present

## 2015-03-17 DIAGNOSIS — K219 Gastro-esophageal reflux disease without esophagitis: Secondary | ICD-10-CM | POA: Insufficient documentation

## 2015-03-17 DIAGNOSIS — Z86718 Personal history of other venous thrombosis and embolism: Secondary | ICD-10-CM | POA: Insufficient documentation

## 2015-03-17 DIAGNOSIS — J45909 Unspecified asthma, uncomplicated: Secondary | ICD-10-CM | POA: Insufficient documentation

## 2015-03-17 MED ORDER — OXYCODONE-ACETAMINOPHEN 5-325 MG PO TABS
ORAL_TABLET | ORAL | Status: AC
  Filled 2015-03-17: qty 1

## 2015-03-17 MED ORDER — OXYCODONE-ACETAMINOPHEN 5-325 MG PO TABS
1.0000 | ORAL_TABLET | Freq: Once | ORAL | Status: AC
  Administered 2015-03-17: 1 via ORAL

## 2015-03-17 NOTE — ED Provider Notes (Signed)
CSN: [id number]     Arrival date & time 03/17/15  1557 History   First MD Initiated Contact with Patient 03/17/15 2227     Chief Complaint  Patient presents with  . Rash  . Abnormal Lab     (Consider location/radiation/quality/duration/timing/severity/associated sxs/prior Treatment) HPI Comments: 45 year old female with a history of esophageal reflux, diabetes mellitus, and DVT presents to the emergency department for evaluation of a rash 3 days to her scalp and forehead. Patient states that the rash is burning and tender to the touch. Patient has been applying topical Benadryl and hydrocortisone lotions without relief. She feels as though the rash is worsening, spreading to her left upper eyelid. Patient denies any vision changes. She does have a history of chickenpox as a child. No other recent exposures, ingestions, or topical contacts.  Patient is also requesting that we check her hemoglobin level. She states that she has history of nausea and vomiting and she was scheduled for a gastric pacemaker. She reports vomiting on a regular basis. She has had no changes from her baseline symptoms. Patient states that she was told she could not have the procedure done secondary to a low hemoglobin level. She has a hx of vomiting black, tarry emesis at times; this is sporadic. No hx of melena or hematochezia. No hx of syncope or SOB. She states that she is scheduled to see a specialist about this, but is unable to get an appointment until July.  Patient is a 45 y.o. female presenting with rash. The history is provided by the patient. No language interpreter was used.  Rash   Past Medical History  Diagnosis Date  . Hidradenitis   . Asthma     prn inhaler  . GERD (gastroesophageal reflux disease)   . Diabetes mellitus     IDDM  . Headache(784.0)     migraines  . DVT (deep venous thrombosis) ([place])     20+ yrs ago (during pregnancy), 2015   Past Surgical History  Procedure Laterality Date  .  Tonsillectomy    . Inguinal hidradenitis excision  07/06/2010    bilateral  . Exploratory laparotomy  08/14/2005    lysis of adhesions, drainage of tubo-ovarian abscess  . Breast reduction surgery  2002  . Appendectomy  1995  . Cholecystectomy  1993  . Tubal ligation  1996  . Vaginal hysterectomy  08/04/2005    and cysto  . Hydradenitis excision  02/08/2011    Procedure: EXCISION HYDRADENITIS GROIN;  Surgeon: [name], MD;  Location: [place];  Service: General;  Laterality: N/A;  Excisioin of Hidradenitis Left groin  . Tummy tuck     Family History  Problem Relation Age of Onset  . Hypertension Father   . Hypertension Sister   . Hypertension Brother    Social History  Substance Use Topics  . Smoking status: Current Every Day Smoker -- 0.50 packs/day for 20 years    Types: Cigarettes  . Smokeless tobacco: Never Used     Comment: 1 pack/week  . Alcohol Use: No   OB History    No data available      Review of Systems  Eyes:       No blurry vision or vision loss  Skin: Positive for rash.  Ten systems reviewed and are negative for acute change, except as noted in the HPI.    Allergies  Penicillins and Penicillin g  Home Medications   Prior to Admission medications   Medication  Sig Start Date End Date Taking? Authorizing Provider  albuterol (PROVENTIL HFA;VENTOLIN HFA) 108 (90 BASE) MCG/ACT inhaler Inhale 1 puff into the lungs every 6 (six) hours as needed for wheezing or shortness of breath.   Yes Historical Provider, MD  Fluticasone-Salmeterol (ADVAIR) 250-50 MCG/DOSE AEPB Inhale 1 puff into the lungs 2 (two) times daily as needed (FOR SHORTNESS OF BREATH).    Yes Historical Provider, MD  glucose blood (ONETOUCH VERIO) test strip Use to test blood sugar 4 times daily as instructed. Dx: E11.9 09/03/14  Yes Philemon Kingdom, MD  insulin aspart (NOVOLOG FLEXPEN) 100 UNIT/ML FlexPen Inject 10-20 Units into the skin 3 (three) times daily with meals.  09/03/14  Yes Philemon Kingdom, MD  Insulin Glargine (LANTUS) 100 UNIT/ML Solostar Pen Inject 25 Units into the skin daily at 10 pm. Patient taking differently: Inject 30 Units into the skin 2 (two) times daily.  06/06/14  Yes Carly Montey Hora, MD  losartan (COZAAR) 100 MG tablet Take 100 mg by mouth daily.   Yes Historical Provider, MD  lovastatin (MEVACOR) 20 MG tablet Take 20 mg by mouth daily.   Yes Historical Provider, MD  montelukast (SINGULAIR) 10 MG tablet Take 10 mg by mouth daily as needed (allergies).    Yes Historical Provider, MD  omeprazole (PRILOSEC) 40 MG capsule Take 40 mg by mouth at bedtime.    Yes Historical Provider, MD  [facility] DELICA LANCETS FINE MISC Use to test blood sugar 4 times daily as instructed. Dx: E11.9 09/03/14  Yes Philemon Kingdom, MD  oxyCODONE-acetaminophen (PERCOCET/ROXICET) 5-325 MG tablet Take 1-2 tablets by mouth every 6 (six) hours as needed for severe pain. 03/18/15   Antonietta Breach, PA-C  valACYclovir (VALTREX) 1000 MG tablet Take 1 tablet (1,000 mg total) by mouth 3 (three) times daily. 03/18/15 03/25/15  Antonietta Breach, PA-C   BP 139/67 mmHg  Pulse 90  Temp(Src) 98.9 F (37.2 C) (Oral)  Resp 20  SpO2 100%   Physical Exam  Constitutional: She is oriented to person, place, and time. She appears well-developed and well-nourished. No distress.  Nontoxic/nonseptic appearing  HENT:  Head: Normocephalic and atraumatic.  Eyes: Conjunctivae and EOM are normal. No scleral icterus.  Slit lamp exam:      The left eye shows no corneal abrasion, no corneal flare, no corneal ulcer and no fluorescein uptake.  Pale sclera. No conjunctival injection or subconjunctival hemorrhage. Patient wearing contact lenses b/l; removed for fluorescein staining of the L eye which was negative for uptake.  Neck: Normal range of motion.  Cardiovascular: Normal rate, regular rhythm and intact distal pulses.   Pulmonary/Chest: Effort normal and breath sounds normal. No respiratory distress.  She has no wheezes.  Respirations even and unlabored  Musculoskeletal: Normal range of motion.  Neurological: She is alert and oriented to person, place, and time. She exhibits normal muscle tone. Coordination normal.  GCS 15. Patient moving all extremities.  Skin: Skin is warm and dry. Rash noted. She is not diaphoretic. No erythema. No pallor.  Burning, tender, maculopapular rash noted to L parietal scalp and L forehead extending to L upper eyelid. No vesicles or pustules. No evidence of nasal lesions; negative Hutchinson's sign.  Psychiatric: She has a normal mood and affect. Her behavior is normal.  Nursing note and vitals reviewed.   ED Course  Procedures (including critical care time) Labs Review Labs Reviewed  CBC - Abnormal; Notable for the following:    RBC 3.75 (*)    Hemoglobin 9.3 (*)  HCT 28.8 (*)    MCV 76.8 (*)    MCH 24.8 (*)    All other components within normal limits  BASIC METABOLIC PANEL    Imaging Review No results found.   I have personally reviewed and evaluated these images and lab results as part of my medical decision-making.   EKG Interpretation None      MDM   Final diagnoses:  Shingles  Chronic anemia    45 year old female presents to the emergency department for evaluation of a rash to her left parietal scalp and forehead. Rash is burning in nature and tender to the touch. Suspect that cause of rash is secondary to herpes zoster/shingles. Patient does have a history of chickenpox as a child. Patient has been given a referral to ophthalmology should she notice blurry vision or other vision changes. Will start on valacyclovir and prescribed Percocet for pain control as needed.  Patient also requesting a check of her hemoglobin level as she states that she was unable to have surgery recently because of low hemoglobin. Patient's hemoglobin is low at 9.3; however, patient has a history of chronic anemia and this is actually improved compared to  laboratory workup 2 months ago. No indication for further emergent workup at this time. Return precautions discussed and provided. Patient discharged in satisfactory condition with no unaddressed concerns.   Filed Vitals:   03/17/15 1606 03/17/15 2205  BP: 175/87 139/67  Pulse: 100 90  Temp: 99.6 F (37.6 C) 98.9 F (37.2 C)  TempSrc: Oral Oral  Resp: 18 20  SpO2: 100% 100%     Antonietta Breach, PA-C 03/18/15 [id number]  Malvin Johns, MD 03/18/15 [id number]

## 2015-03-17 NOTE — ED Notes (Addendum)
Pt reports burning pain to left side of face, head, eye and ear. Rash noted to forehead, possible shingles. Also request blood work to check hgb, was told she is unable to get surgery due to abnormal results.

## 2015-03-18 LAB — CBC
HEMATOCRIT: 28.8 % — AB (ref 36.0–46.0)
HEMOGLOBIN: 9.3 g/dL — AB (ref 12.0–15.0)
MCH: 24.8 pg — AB (ref 26.0–34.0)
MCHC: 32.3 g/dL (ref 30.0–36.0)
MCV: 76.8 fL — AB (ref 78.0–100.0)
Platelets: 360 10*3/uL (ref 150–400)
RBC: 3.75 MIL/uL — ABNORMAL LOW (ref 3.87–5.11)
RDW: 12.6 % (ref 11.5–15.5)
WBC: 9 10*3/uL (ref 4.0–10.5)

## 2015-03-18 LAB — BASIC METABOLIC PANEL
Anion gap: 10 (ref 5–15)
BUN: 24 mg/dL — ABNORMAL HIGH (ref 6–20)
CHLORIDE: 94 mmol/L — AB (ref 101–111)
CO2: 28 mmol/L (ref 22–32)
CREATININE: 2.4 mg/dL — AB (ref 0.44–1.00)
Calcium: 9.1 mg/dL (ref 8.9–10.3)
GFR calc non Af Amer: 23 mL/min — ABNORMAL LOW (ref >60)
GFR, EST AFRICAN AMERICAN: 27 mL/min — AB (ref >60)
Glucose, Bld: 652 mg/dL (ref 65–99)
Potassium: 4.5 mmol/L (ref 3.5–5.1)
Sodium: 132 mmol/L — ABNORMAL LOW (ref 135–145)

## 2015-03-18 MED ORDER — FLUORESCEIN SODIUM 1 MG OP STRP
ORAL_STRIP | OPHTHALMIC | Status: AC
  Filled 2015-03-18: qty 1

## 2015-03-18 MED ORDER — VALACYCLOVIR HCL 1 G PO TABS
1000.0000 mg | ORAL_TABLET | Freq: Three times a day (TID) | ORAL | Status: AC
Start: 2015-03-18 — End: 2015-03-25

## 2015-03-18 MED ORDER — TETRACAINE HCL 0.5 % OP SOLN
1.0000 [drp] | Freq: Once | OPHTHALMIC | Status: AC
  Administered 2015-03-18: 1 [drp] via OPHTHALMIC

## 2015-03-18 MED ORDER — OXYCODONE-ACETAMINOPHEN 5-325 MG PO TABS: 1.0000 | ORAL_TABLET | Freq: Four times a day (QID) | ORAL | Status: DC | PRN

## 2015-03-18 MED ORDER — FLUORESCEIN SODIUM 1 MG OP STRP
1.0000 | ORAL_STRIP | Freq: Once | OPHTHALMIC | Status: AC
  Administered 2015-03-18: 1 via OPHTHALMIC

## 2015-03-18 MED ORDER — VALACYCLOVIR HCL 500 MG PO TABS
1000.0000 mg | ORAL_TABLET | Freq: Once | ORAL | Status: AC
  Administered 2015-03-18: 1000 mg via ORAL
  Filled 2015-03-18: qty 2

## 2015-03-18 NOTE — Discharge Instructions (Signed)
Shingles Shingles, which is also known as herpes zoster, is an infection that causes a painful skin rash and fluid-filled blisters. Shingles is not related to genital herpes, which is a sexually transmitted infection.   Shingles only develops in people who:  Have had chickenpox.  Have received the chickenpox vaccine. (This is rare.) CAUSES Shingles is caused by varicella-zoster virus (VZV). This is the same virus that causes chickenpox. After exposure to VZV, the virus stays in the body in an inactive (dormant) state. Shingles develops if the virus reactivates. This can happen many years after the initial exposure to VZV. It is not known what causes this virus to reactivate. RISK FACTORS People who have had chickenpox or received the chickenpox vaccine are at risk for shingles. Infection is more common in people who:  Are older than age 50.  Have a weakened defense (immune) system, such as those with HIV, AIDS, or cancer.  Are taking medicines that weaken the immune system, such as transplant medicines.  Are under great stress. SYMPTOMS Early symptoms of this condition include itching, tingling, and pain in an area on your skin. Pain may be described as burning, stabbing, or throbbing. A few days or weeks after symptoms start, a painful red rash appears, usually on one side of the body in a bandlike or beltlike pattern. The rash eventually turns into fluid-filled blisters that break open, scab over, and dry up in about 2-3 weeks. At any time during the infection, you may also develop:  A fever.  Chills.  A headache.  An upset stomach. DIAGNOSIS This condition is diagnosed with a skin exam. Sometimes, skin or fluid samples are taken from the blisters before a diagnosis is made. These samples are examined under a microscope or sent to a lab for testing. TREATMENT There is no specific cure for this condition. Your health care provider will probably prescribe medicines to help you  manage pain, recover more quickly, and avoid long-term problems. Medicines may include:  Antiviral drugs.  Anti-inflammatory drugs.  Pain medicines. If the area involved is on your face, you may be referred to a specialist, such as an eye doctor (ophthalmologist) or an ear, nose, and throat (ENT) doctor to help you avoid eye problems, chronic pain, or disability. HOME CARE INSTRUCTIONS Medicines  Take medicines only as directed by your health care provider.  Apply an anti-itch or numbing cream to the affected area as directed by your health care provider. Blister and Rash Care  Take a cool bath or apply cool compresses to the area of the rash or blisters as directed by your health care provider. This may help with pain and itching.  Keep your rash covered with a loose bandage (dressing). Wear loose-fitting clothing to help ease the pain of material rubbing against the rash.  Keep your rash and blisters clean with mild soap and cool water or as directed by your health care provider.  Check your rash every day for signs of infection. These include redness, swelling, and pain that lasts or increases.  Do not pick your blisters.  Do not scratch your rash. General Instructions  Rest as directed by your health care provider.  Keep all follow-up visits as directed by your health care provider. This is important.  Until your blisters scab over, your infection can cause chickenpox in people who have never had it or been vaccinated against it. To prevent this from happening, avoid contact with other people, especially:  Babies.  Pregnant women.  Children   who have eczema.  Elderly people who have transplants.  People who have chronic illnesses, such as leukemia or AIDS. SEEK MEDICAL CARE IF:  Your pain is not relieved with prescribed medicines.  Your pain does not get better after the rash heals.  Your rash looks infected. Signs of infection include redness, swelling, and pain  that lasts or increases. SEEK IMMEDIATE MEDICAL CARE IF:  The rash is on your face or nose.  You have facial pain, pain around your eye area, or loss of feeling on one side of your face.  You have ear pain or you have ringing in your ear.  You have loss of taste.  Your condition gets worse.   This information is not intended to replace advice given to you by your health care provider. Make sure you discuss any questions you have with your health care provider.   Document Released: 12/28/2004 Document Revised: 01/18/2014 Document Reviewed: 11/08/2013 Elsevier Interactive Patient Education 2016 [employer]  

## 2015-03-21 NOTE — Progress Notes (Signed)
Chart reviewed by Dr Kalman Shan. Blood glucose levels elevated 652and all electrolytes are abnormal. States pt needs better glucose control before surgery. Ivin Booty at Dr Janeice Robinson office notified.

## 2015-03-26 ENCOUNTER — Ambulatory Visit (HOSPITAL_BASED_OUTPATIENT_CLINIC_OR_DEPARTMENT_OTHER): Admission: RE | Admit: 2015-03-26 | Payer: 59 | Source: Ambulatory Visit | Admitting: Otolaryngology

## 2015-03-26 ENCOUNTER — Encounter (HOSPITAL_BASED_OUTPATIENT_CLINIC_OR_DEPARTMENT_OTHER): Admission: RE | Payer: Self-pay | Source: Ambulatory Visit

## 2015-03-26 SURGERY — SINUS SURGERY, ENDOSCOPIC
Anesthesia: General | Laterality: Left

## 2015-08-19 ENCOUNTER — Ambulatory Visit: Payer: Self-pay

## 2015-08-19 ENCOUNTER — Encounter (INDEPENDENT_AMBULATORY_CARE_PROVIDER_SITE_OTHER): Payer: Self-pay | Admitting: Podiatry

## 2015-08-19 DIAGNOSIS — E119 Type 2 diabetes mellitus without complications: Secondary | ICD-10-CM

## 2015-08-19 NOTE — Progress Notes (Signed)
This encounter was created in error - please disregard.

## 2015-08-21 ENCOUNTER — Ambulatory Visit (INDEPENDENT_AMBULATORY_CARE_PROVIDER_SITE_OTHER): Payer: Self-pay | Admitting: Internal Medicine

## 2015-08-21 ENCOUNTER — Encounter: Payer: Self-pay | Admitting: Internal Medicine

## 2015-08-21 VITALS — BP 164/90 | HR 105 | Ht 66.0 in | Wt 181.0 lb

## 2015-08-21 DIAGNOSIS — E0822 Diabetes mellitus due to underlying condition with diabetic chronic kidney disease: Secondary | ICD-10-CM

## 2015-08-21 DIAGNOSIS — IMO0002 Reserved for concepts with insufficient information to code with codable children: Secondary | ICD-10-CM

## 2015-08-21 DIAGNOSIS — N184 Chronic kidney disease, stage 4 (severe): Secondary | ICD-10-CM

## 2015-08-21 DIAGNOSIS — E0865 Diabetes mellitus due to underlying condition with hyperglycemia: Secondary | ICD-10-CM

## 2015-08-21 DIAGNOSIS — Z794 Long term (current) use of insulin: Secondary | ICD-10-CM

## 2015-08-21 MED ORDER — INSULIN NPH (HUMAN) (ISOPHANE) 100 UNIT/ML ~~LOC~~ SUSP: SUBCUTANEOUS | 2 refills | Status: DC

## 2015-08-21 MED ORDER — INSULIN REGULAR HUMAN 100 UNIT/ML IJ SOLN: INTRAMUSCULAR | 2 refills | Status: DC

## 2015-08-21 MED ORDER — "INSULIN SYRINGE-NEEDLE U-100 31G X 5/16"" 0.5 ML MISC": 11 refills | Status: DC

## 2015-08-21 NOTE — Patient Instructions (Addendum)
Please stop the Lantus and Novolog and start the following:  Insulin Before breakfast Before lunch Before dinner  Regular 20 20  25   NPH 40  30   Please inject the insulin at the start of the  meals.  Please add a R insulin sliding scale as follows:  - 150- 165: + 1 unit  - 166- 180: + 2 units  - 181- 195: + 3 units  - 196- 210: + 4 units  - 211- 225: + 5 units  - 226- 240: + 6 units  - 241- 255: + 7 units  - 256- 270: + 8 units  - 271- 285: + 9 units  - > 286: + 10 units  Please return in 1.5 months with your sugar log.   Check sugars at least 3x a day and bring log at next visit.

## 2015-08-21 NOTE — Progress Notes (Signed)
Subjective:     Patient ID: Susan Horton, female   DOB: [birth year], 45 y.o.   MRN: [id number]  HPI Susan Horton is a 45 y.o. woman returning for f/u for DM (unclear if 1 or 2), dx in 1996 when pregnant with her daughter, insulin-dependent since 1999, uncontrolled, with complications (peripheral neuropathy, gastroparesis).   She is not working anymore. No insurance now.   Last visit 1 year ago. She did not return in 1.5 months, as advised.  Last HbA1c: 07/24/2015: HbA1c 16%! Lab Results  Component Value Date   HGBA1C 12.4 09/03/2014   HGBA1C 11.6 (H) 08/22/2013   HGBA1C 13.5 Repeated and verified X2. (H) 04/14/2012   She is on a regimen of: - Lantus 25 >> 35 units 2x a day - Novolog as follows: 10 units for a small meal  20 units for la larger meal - NovoLog sliding scale as follows:  - 150- 165: + 1 unit  - 166- 180: + 2 units  - 181- 195: + 3 units  - 196- 210: + 4 units  - 211- 225: + 5 units  - 226- 240: + 6 units  - 241- 255: + 7 units  - 256- 270: + 8 units  - 271- 285: + 9 units  - > 286: + 10 units  She checks her sugars 3x a day - brought meter >> not many values as she has 2 meters, but very high: - am: 200s, occasionally 300s >> 250s >> 234-250 >> low 200s >> 210-230 >> 250 >> 353-421 - mid-morning: 90 (if skips b'fast) - 134 >> n/c - before lunch: 250-300s >> 200-250 >> 170s-200s, 354 x2 >> n/c  >> 350s >> 541, HI  - before dinner: 200-250 >> 198-230 >> 200-300 >> 260-300 >> 250 >> 462  - before bedtime: 300-400s >> 250-<300 >> n/c >> 400-HI >> 254 to HI  - No CKD, but had AKI at last admission. Last BUN/creatinine was: 07/24/2015: 45/2.27, GFR 28 Lab Results  Component Value Date   BUN 24 (H) 03/17/2015   Lab Results  Component Value Date   CREATININE 2.40 (H) 03/17/2015  She is on lisinopril. - Latest cholesterol level: 07/24/2015: 251/733/25/117 Lab Results  Component Value Date   CHOL 239 (H) 01/01/2010   HDL 33 (L) 01/01/2010   LDLCALC 176 (H)  01/01/2010   TRIG 149 01/01/2010   CHOLHDL 7.2 Ratio 01/01/2010   she is not on a statin. - No DR, last eye exem: 02/2014 >> no DR reportedly - + tingling/numbness in feet >> sees podiatry  She had multiple visits to the ED and admissions For hyperglycemia, gastroparesis exacerbation.  Review of Systems Constitutional: no weight gain/loss, + Fatigue, + nocturia Eyes: occasionally blurry vision, no xerophthalmia ENT:no sore throat, no nodules palpated in throat, no dysphagia/odynophagia, no hoarseness Cardiovascular: no CP/SOB/palpitations/swelling in hands and feet Respiratory: + cough/no SOB Gastrointestinal: + N/+ V/no D/C/+ heartburn Musculoskeletal: no muscle/joint aches Skin: no rashes Neurological: no tremors/numbness/tingling/dizziness, no HA  Objective:   Physical Exam BP (!) 164/90 (BP Location: Left Arm, Patient Position: Sitting)   Pulse (!) 105   Ht 5\' 6"  (1.676 m)   Wt 181 lb (82.1 kg)   SpO2 99%   BMI 29.21 kg/m  Body mass index is 29.21 kg/m. Wt Readings from Last 3 Encounters:  08/21/15 181 lb (82.1 kg)  01/01/15 179 lb (81.2 kg)  10/22/14 180 lb (81.6 kg)  Constitutional: overweight - abdominal, in NAD Eyes: PERRLA, EOMI,  no exophthalmos ENT: moist mucous membranes, no thyromegaly, no cervical lymphadenopathy Cardiovascular: RRR, No MRG Respiratory: CTA B Gastrointestinal: abdomen soft, NT, ND, BS+ Musculoskeletal: no deformities, strength intact in all 4 Skin: moist, warm, no rashes Neurological: no tremor with outstretched hands, DTR normal in all 4    Assessment:     1. DM - unclear If type I or type II, insulin-dependent, uncontrolled, with complications - peripheral neuropathy - gastroparesis 10/06/2005: GES: Clinical Data: 45 year-old, gastroparesis.  NUCLEAR MEDICINE GASTRIC EMPTYING SCAN:  Technique: After oral ingestion of radiolabeled meal, sequential abdominal images were obtained for 120 minutes. Residual percentage of activity  remaining within the stomach was calculated at 60 and 120 minutes.  Radiopharmaceutical: 2 mCi Tc-81m sulfur colloid in egg meal.  Findings: Anterior imaging was performed over the stomach over a 2-hour period. At 60 minutes, close to 100% of the activity remains in the stomach, although there is some activity in the small bowel. At 2 hours, 91% of the activity remained in the stomach.  IMPRESSION:  Marked delayed solid phase gastric emptying consistent with gastroparesis.  She was on an insulin pump which she liked and her DM was controlled then, but lost her insurance. I did gave her brochures about insulin pumps but she did decide yet.  Patient has been diagnosed with diabetes when she was 45, during her pregnancy, but it is unclear whether this is type I or type II. She has not have episodes of DKA, which would point more towards type 2 diabetes. I would like to check her C-peptide, however the patient mentions that she is rarely at or lower than 200, and the C-peptide measurement is only valid if the sugars are lower than that.   Component     Latest Ref Rng 06/06/2014  Glutamic Acid Decarb Ab     0.0 - 5.0 U/mL <5.0  Pancreatic Islet Cell Antibody     Neg:<1:1 Negative   Plan:     1. DM - Pt with poor DM control due to lack of bolusing/checking sugars, returning after a long absence. Reviewed last HbA1c from last month, which was even higher, at 16%. - I again underlined compliance because such high sugars are usually seen in the absence of any treatment. She mentions that she is taking her insulin, but she cannot eat well because of her gastroparesis. She is preparing to get a stomach pacemaker, however she does need to get her diabetes under control first. I explained that such high sugars are conducive to gastroparesis exacerbations, so is definitely desirable to improve her diabetes to help with this problem. Also, her triglycerides are very high, which is a risk factor for  pancreatitis. Improving her diabetes will help with this, also, since insulin activates lipoprotein lipase. She also has stage IV chronic kidney disease, and I discussed with her that she will probably need to be seen by a nephrologist at this point. She will discuss with her PCP about this. Her blood pressure is also high, which will exacerbate her CKD and increases her risk of a cardiovascular event. Overall, this is a complex patient with multiple issues, and will need close monitoring of the above problems. I hope that she understands the risks of uncontrolled diabetes and will return to our appointments more frequently. - She tells me that she cannot afford her insulin anymore, now that she is out of work. We'll switch to NPH and regular insulin from [employer]. I advised her to mix the N and  R insulins and given her written instructions about this - I advised her to: Patient Instructions   Please stop the Lantus and Novolog and start the following:  Insulin Before breakfast Before lunch Before dinner  Regular 20 20  25   NPH 40  30   Please inject the insulin at the start of the  meals.  Please add a R insulin sliding scale as follows:  - 150- 165: + 1 unit  - 166- 180: + 2 units  - 181- 195: + 3 units  - 196- 210: + 4 units  - 211- 225: + 5 units  - 226- 240: + 6 units  - 241- 255: + 7 units  - 256- 270: + 8 units  - 271- 285: + 9 units  - > 286: + 10 units  Please return in 1.5 months with your sugar log.   Check sugars at least 3x a day and bring log at next visit.  - Return in about 6 weeks (around 10/02/2015).   - time spent with the patient: 40 minutes, of which >50% was spent in obtaining information about her symptoms, reviewing her previous labs, evaluations, and treatments, counseling her about her condition (please see the discussed topics above), and designing a new, more affordable, insulin treatment for her; she had a number of questions which I addressed.  Philemon Kingdom, MD PhD [facility] Endocrinology

## 2015-08-25 ENCOUNTER — Encounter (HOSPITAL_COMMUNITY): Payer: Self-pay | Admitting: Emergency Medicine

## 2015-08-25 DIAGNOSIS — N390 Urinary tract infection, site not specified: Secondary | ICD-10-CM | POA: Insufficient documentation

## 2015-08-25 DIAGNOSIS — F17210 Nicotine dependence, cigarettes, uncomplicated: Secondary | ICD-10-CM | POA: Insufficient documentation

## 2015-08-25 DIAGNOSIS — E1040 Type 1 diabetes mellitus with diabetic neuropathy, unspecified: Secondary | ICD-10-CM | POA: Insufficient documentation

## 2015-08-25 DIAGNOSIS — J45909 Unspecified asthma, uncomplicated: Secondary | ICD-10-CM | POA: Insufficient documentation

## 2015-08-25 DIAGNOSIS — E1043 Type 1 diabetes mellitus with diabetic autonomic (poly)neuropathy: Secondary | ICD-10-CM | POA: Insufficient documentation

## 2015-08-25 LAB — BASIC METABOLIC PANEL
ANION GAP: 6 (ref 5–15)
BUN: 41 mg/dL — ABNORMAL HIGH (ref 6–20)
CHLORIDE: 98 mmol/L — AB (ref 101–111)
CO2: 23 mmol/L (ref 22–32)
Calcium: 8.9 mg/dL (ref 8.9–10.3)
Creatinine, Ser: 2.69 mg/dL — ABNORMAL HIGH (ref 0.44–1.00)
GFR calc non Af Amer: 20 mL/min — ABNORMAL LOW (ref >60)
GFR, EST AFRICAN AMERICAN: 23 mL/min — AB (ref >60)
Glucose, Bld: 489 mg/dL — ABNORMAL HIGH (ref 65–99)
POTASSIUM: 4.6 mmol/L (ref 3.5–5.1)
SODIUM: 127 mmol/L — AB (ref 135–145)

## 2015-08-25 LAB — URINE MICROSCOPIC-ADD ON

## 2015-08-25 LAB — URINALYSIS, ROUTINE W REFLEX MICROSCOPIC
Bilirubin Urine: NEGATIVE
Ketones, ur: NEGATIVE mg/dL
LEUKOCYTES UA: NEGATIVE
Nitrite: NEGATIVE
PH: 6 (ref 5.0–8.0)
PROTEIN: 100 mg/dL — AB
SPECIFIC GRAVITY, URINE: 1.02 (ref 1.005–1.030)

## 2015-08-25 LAB — CBC
HEMATOCRIT: 31.9 % — AB (ref 36.0–46.0)
HEMOGLOBIN: 10 g/dL — AB (ref 12.0–15.0)
MCH: 24.6 pg — AB (ref 26.0–34.0)
MCHC: 31.3 g/dL (ref 30.0–36.0)
MCV: 78.4 fL (ref 78.0–100.0)
PLATELETS: 417 10*3/uL — AB (ref 150–400)
RBC: 4.07 MIL/uL (ref 3.87–5.11)
RDW: 12.8 % (ref 11.5–15.5)
WBC: 11.7 10*3/uL — AB (ref 4.0–10.5)

## 2015-08-25 LAB — CBG MONITORING, ED: Glucose-Capillary: 511 mg/dL (ref 65–99)

## 2015-08-25 NOTE — ED Triage Notes (Signed)
Generalized weakness for 2 weeks. Fallen twice last week due to legs giving away. States having trouble with her blood sugar, unable to get it below 400 for a while now. CBG 511 in triage.

## 2015-08-26 ENCOUNTER — Emergency Department (HOSPITAL_COMMUNITY)
Admission: EM | Admit: 2015-08-26 | Discharge: 2015-08-26 | Disposition: A | Discharge status: Home / Self Care | Payer: No Typology Code available for payment source | Attending: Emergency Medicine | Admitting: Emergency Medicine

## 2015-08-26 DIAGNOSIS — R531 Weakness: Secondary | ICD-10-CM

## 2015-08-26 DIAGNOSIS — R739 Hyperglycemia, unspecified: Secondary | ICD-10-CM

## 2015-08-26 DIAGNOSIS — N390 Urinary tract infection, site not specified: Secondary | ICD-10-CM

## 2015-08-26 LAB — CBG MONITORING, ED: GLUCOSE-CAPILLARY: 393 mg/dL — AB (ref 65–99)

## 2015-08-26 MED ORDER — FLUCONAZOLE 150 MG PO TABS: 150.0000 mg | ORAL_TABLET | Freq: Every day | ORAL | 0 refills | Status: DC

## 2015-08-26 MED ORDER — NITROFURANTOIN MONOHYD MACRO 100 MG PO CAPS: 100.0000 mg | ORAL_CAPSULE | Freq: Two times a day (BID) | ORAL | 0 refills | Status: DC

## 2015-08-26 MED ORDER — SODIUM CHLORIDE 0.9 % IV BOLUS (SEPSIS)
1000.0000 mL | Freq: Once | INTRAVENOUS | Status: AC
  Administered 2015-08-26: 1000 mL via INTRAVENOUS

## 2015-08-26 NOTE — ED Provider Notes (Signed)
[facility] DEPT Provider Note   CSN: [id number] Arrival date & time: 08/25/15  2225     History   Chief Complaint Chief Complaint  Patient presents with  . Weakness  . Hyperglycemia    HPI Susan Horton is a 45 y.o. female.  Patient with history of diabetes, chronic kidney disease presents with complaints of elevated blood sugars and generalized weakness for the past 2 weeks. Patient states that she has been very weak in her legs and has fallen a couple times when her legs have given out. No injuries from the falls reported. Her blood sugars have been in the 400-500 range. Patient has a history of gastroparesis and states that she vomits every day. This is not any worse than baseline. No fevers, URI symptoms, chest pain, cough, diarrhea. Patient states that her urinary output is less than usual but she is urinating without dysuria or hematuria. Patient denies any skin rashes. No treatments prior to arrival. States that she is able to drink fluids without vomiting.      Past Medical History:  Diagnosis Date  . Asthma    prn inhaler  . Diabetes mellitus    IDDM  . DVT (deep venous thrombosis) ([place])    20+ yrs ago (during pregnancy), 2015  . GERD (gastroesophageal reflux disease)   . Headache(784.0)    migraines  . Hidradenitis     Patient Active Problem List   Diagnosis Date Noted  . Diabetic gastroparesis ([place])   . Chest pain 06/05/2014  . Intractable nausea and vomiting 06/05/2014  . Uncontrolled type 2 diabetes mellitus with peripheral neuropathy ([place]) 06/05/2014  . Normocytic anemia 06/05/2014  . Hyperglycemia   . Monilial vaginitis 07/10/2010    Past Surgical History:  Procedure Laterality Date  . APPENDECTOMY  1995  . BREAST REDUCTION SURGERY  2002  . CHOLECYSTECTOMY  1993  . EXPLORATORY LAPAROTOMY  08/14/2005   lysis of adhesions, drainage of tubo-ovarian abscess  . HYDRADENITIS EXCISION  02/08/2011   Procedure: EXCISION HYDRADENITIS GROIN;  Surgeon:  [name], MD;  Location: [facility];  Service: General;  Laterality: N/A;  Excisioin of Hidradenitis Left groin  . INGUINAL HIDRADENITIS EXCISION  07/06/2010   bilateral  . TONSILLECTOMY    . TUBAL LIGATION  1996  . tummy tuck    . VAGINAL HYSTERECTOMY  08/04/2005   and cysto    OB History    No data available       Home Medications    Prior to Admission medications   Medication Sig Start Date End Date Taking? Authorizing Provider  albuterol (PROVENTIL HFA;VENTOLIN HFA) 108 (90 BASE) MCG/ACT inhaler Inhale 1 puff into the lungs every 6 (six) hours as needed for wheezing or shortness of breath.    Historical Provider, MD  Fluticasone-Salmeterol (ADVAIR) 250-50 MCG/DOSE AEPB Inhale 1 puff into the lungs 2 (two) times daily as needed (FOR SHORTNESS OF BREATH).     Historical Provider, MD  glucose blood (ONETOUCH VERIO) test strip Use to test blood sugar 4 times daily as instructed. Dx: E11.9 09/03/14   Philemon Kingdom, MD  insulin NPH Human (NOVOLIN N RELION) 100 UNIT/ML injection Inject 40 units in am and 30 units before dinner 08/21/15   Philemon Kingdom, MD  insulin regular (NOVOLIN R RELION) 100 units/mL injection Inject 20-25 units before meals  - 3x a day 08/21/15   Philemon Kingdom, MD  Insulin Syringe-Needle U-100 (RELION INSULIN SYR 0.5ML/31G) 31G X 5/16" 0.5 ML MISC Use  3x a day 08/21/15   Philemon Kingdom, MD  losartan (COZAAR) 100 MG tablet Take 100 mg by mouth daily.    Historical Provider, MD  lovastatin (MEVACOR) 20 MG tablet Take 20 mg by mouth daily.    Historical Provider, MD  montelukast (SINGULAIR) 10 MG tablet Take 10 mg by mouth daily as needed (allergies).     Historical Provider, MD  omeprazole (PRILOSEC) 40 MG capsule Take 40 mg by mouth at bedtime.     Historical Provider, MD  [facility] DELICA LANCETS FINE MISC Use to test blood sugar 4 times daily as instructed. Dx: E11.9 09/03/14   Philemon Kingdom, MD  oxyCODONE-acetaminophen  (PERCOCET/ROXICET) 5-325 MG tablet Take 1-2 tablets by mouth every 6 (six) hours as needed for severe pain. 03/18/15   Antonietta Breach, PA-C    Family History Family History  Problem Relation Age of Onset  . Hypertension Father   . Hypertension Sister   . Hypertension Brother     Social History Social History  Substance Use Topics  . Smoking status: Current Every Day Smoker    Packs/day: 0.50    Years: 20.00    Types: Cigarettes  . Smokeless tobacco: Never Used     Comment: 1 pack/week  . Alcohol use No     Allergies   Penicillins and Penicillin g   Review of Systems Review of Systems  Constitutional: Negative for fever.  HENT: Negative for rhinorrhea and sore throat.   Eyes: Negative for redness.  Respiratory: Negative for cough and shortness of breath.   Cardiovascular: Negative for chest pain.  Gastrointestinal: Positive for nausea and vomiting. Negative for abdominal pain and diarrhea.  Genitourinary: Positive for decreased urine volume. Negative for dysuria, flank pain and frequency.  Musculoskeletal: Negative for myalgias.  Skin: Negative for rash.  Neurological: Positive for weakness. Negative for headaches.     Physical Exam Updated Vital Signs BP 157/86 (BP Location: Right Arm)   Pulse 114   Temp 98.6 F (37 C) (Oral)   Resp 16   SpO2 96%   Physical Exam  Constitutional: She appears well-developed and well-nourished.  HENT:  Head: Normocephalic and atraumatic.  Right Ear: External ear normal.  Left Ear: External ear normal.  Mouth/Throat: Mucous membranes are dry. No oropharyngeal exudate, posterior oropharyngeal edema or posterior oropharyngeal erythema.  Mildly dry mucous membranes  Eyes: Conjunctivae are normal. Right eye exhibits no discharge. Left eye exhibits no discharge.  Neck: Normal range of motion. Neck supple.  Cardiovascular: Regular rhythm and normal heart sounds.  Tachycardia present.   Mild tachycardia.  Pulmonary/Chest: Effort normal  and breath sounds normal.  Abdominal: Soft. There is no tenderness.  Neurological: She is alert.  Skin: Skin is warm and dry.  Psychiatric: She has a normal mood and affect.  Nursing note and vitals reviewed.    ED Treatments / Results  Labs (all labs ordered are listed, but only abnormal results are displayed) Labs Reviewed  BASIC METABOLIC PANEL - Abnormal; Notable for the following:       Result Value   Sodium 127 (*)    Chloride 98 (*)    Glucose, Bld 489 (*)    BUN 41 (*)    Creatinine, Ser 2.69 (*)    GFR calc non Af Amer 20 (*)    GFR calc Af Amer 23 (*)    All other components within normal limits  CBC - Abnormal; Notable for the following:    WBC 11.7 (*)  Hemoglobin 10.0 (*)    HCT 31.9 (*)    MCH 24.6 (*)    Platelets 417 (*)    All other components within normal limits  URINALYSIS, ROUTINE W REFLEX MICROSCOPIC (NOT AT [facility]) - Abnormal; Notable for the following:    Color, Urine STRAW (*)    APPearance CLOUDY (*)    Glucose, UA >1000 (*)    Hgb urine dipstick SMALL (*)    Protein, ur 100 (*)    All other components within normal limits  URINE MICROSCOPIC-ADD ON - Abnormal; Notable for the following:    Squamous Epithelial / LPF 6-30 (*)    Bacteria, UA MANY (*)    All other components within normal limits  CBG MONITORING, ED - Abnormal; Notable for the following:    Glucose-Capillary 511 (*)    All other components within normal limits  CBG MONITORING, ED - Abnormal; Notable for the following:    Glucose-Capillary 393 (*)    All other components within normal limits  CBG MONITORING, ED    EKG  EKG Interpretation None       Radiology No results found.  Procedures Procedures (including critical care time)  Medications Ordered in ED Medications  sodium chloride 0.9 % bolus 1,000 mL (0 mLs Intravenous Stopped 08/26/15 0703)  sodium chloride 0.9 % bolus 1,000 mL (0 mLs Intravenous Stopped 08/26/15 0703)     Initial Impression / Assessment  and Plan / ED Course  I have reviewed the triage vital signs and the nursing notes.  Pertinent labs & imaging results that were available during my care of the patient were reviewed by me and considered in my medical decision making (see chart for details).  Clinical Course  Comment By Time  Patient seen and examined. No DKA. Labs are suggestive of dehydration, possible urinary tract infection. No indications for admission at this time. Will hydrate patient and see how she is feeling after this. Chronic kidney disease at baseline. Chronic anemia is improved from baseline, however likely represents hemoconcentration. Carlisle Cater, [place] 08/15 [phone]  Patient feeling better after 2 L of normal saline. Vital signs improved. Will discharge to home with antibiotic for UTI, dose of Diflucan.  Patient encouraged follow-up with her primary care physician. Encouraged good hydration. Return with worsening symptoms, vomiting, fever. Patient verbalizes understanding and agrees with plan. Carlisle Cater, PA-C 08/15 [id number]   7:27 AM  BP 160/89   Pulse 98   Temp 98.6 F (37 C) (Oral)   Resp 15   SpO2 100%   Final Clinical Impressions(s) / ED Diagnoses   Final diagnoses:  Hyperglycemia  Generalized weakness  UTI (lower urinary tract infection)   Hyperglycemia: No DKA. Possibly elevated secondary to infection. No fever, patient is nontoxic appearing. Tachycardia improved with fluids. Blood sugar into the 300s.  Generalized weakness: Likely related to hyperglycemia, dehydration, urinary tract infection.  UTI: Treat with Macrobid. Yeast is likely contaminant. Patient given dose of Diflucan as she normally will get yeast infections with antibiotics.  New Prescriptions New Prescriptions   FLUCONAZOLE (DIFLUCAN) 150 MG TABLET    Take 1 tablet (150 mg total) by mouth daily.   NITROFURANTOIN, MACROCRYSTAL-MONOHYDRATE, (MACROBID) 100 MG CAPSULE    Take 1 capsule (100 mg total) by mouth 2 (two) times daily.      Carlisle Cater, PA-C [id number] [phone]    [name], MD [id number]

## 2015-08-26 NOTE — Discharge Instructions (Signed)
Please read and follow all provided instructions.  Your diagnoses today include:  1. Hyperglycemia   2. Generalized weakness   3. UTI (lower urinary tract infection)     Tests performed today include:  Blood sugar - elevated  Blood counts and electrolytes  Urine test - suggest possible urinary tract infection and yeast  Vital signs. See below for your results today.   Medications prescribed:   Macrobid - antibiotic for urinary tract infection  You have been prescribed an antibiotic medicine: take the entire course of medicine even if you are feeling better. Stopping early can cause the antibiotic not to work.   Diflucan - medication for yeast infection  Take any prescribed medications only as directed.  Home care instructions:  Follow any educational materials contained in this packet.  BE VERY CAREFUL not to take multiple medicines containing Tylenol (also called acetaminophen). Doing so can lead to an overdose which can damage your liver and cause liver failure and possibly death.   Follow-up instructions: Please follow-up with your primary care provider in the next 3 days for further evaluation of your symptoms.   Return instructions:   Please return to the Emergency Department if you experience worsening symptoms.   Please return if you have any other emergent concerns.  Additional Information:  Your vital signs today were: BP 160/89    Pulse 98    Temp 98.6 F (37 C) (Oral)    Resp 15    SpO2 100%  If your blood pressure (BP) was elevated above 135/85 this visit, please have this repeated by your doctor within one month. --------------

## 2015-08-26 NOTE — ED Notes (Signed)
PA at bedside.

## 2015-08-26 NOTE — ED Notes (Signed)
Pt staets she understands instructions. Pt home stable with steady gait.

## 2015-10-02 ENCOUNTER — Ambulatory Visit: Payer: No Typology Code available for payment source | Admitting: Internal Medicine

## 2015-11-20 ENCOUNTER — Emergency Department (HOSPITAL_COMMUNITY)
Admission: EM | Admit: 2015-11-20 | Discharge: 2015-11-21 | Disposition: A | Discharge status: Home / Self Care | Payer: No Typology Code available for payment source | Attending: Emergency Medicine | Admitting: Emergency Medicine

## 2015-11-20 ENCOUNTER — Encounter (HOSPITAL_COMMUNITY): Payer: Self-pay | Admitting: Emergency Medicine

## 2015-11-20 ENCOUNTER — Emergency Department (HOSPITAL_COMMUNITY): Payer: No Typology Code available for payment source

## 2015-11-20 DIAGNOSIS — Z794 Long term (current) use of insulin: Secondary | ICD-10-CM | POA: Insufficient documentation

## 2015-11-20 DIAGNOSIS — D649 Anemia, unspecified: Secondary | ICD-10-CM | POA: Insufficient documentation

## 2015-11-20 DIAGNOSIS — E1143 Type 2 diabetes mellitus with diabetic autonomic (poly)neuropathy: Secondary | ICD-10-CM | POA: Insufficient documentation

## 2015-11-20 DIAGNOSIS — F17210 Nicotine dependence, cigarettes, uncomplicated: Secondary | ICD-10-CM | POA: Insufficient documentation

## 2015-11-20 DIAGNOSIS — E1165 Type 2 diabetes mellitus with hyperglycemia: Secondary | ICD-10-CM | POA: Insufficient documentation

## 2015-11-20 DIAGNOSIS — J45909 Unspecified asthma, uncomplicated: Secondary | ICD-10-CM | POA: Insufficient documentation

## 2015-11-20 DIAGNOSIS — R739 Hyperglycemia, unspecified: Secondary | ICD-10-CM

## 2015-11-20 DIAGNOSIS — R0789 Other chest pain: Secondary | ICD-10-CM | POA: Insufficient documentation

## 2015-11-20 DIAGNOSIS — E1140 Type 2 diabetes mellitus with diabetic neuropathy, unspecified: Secondary | ICD-10-CM | POA: Insufficient documentation

## 2015-11-20 DIAGNOSIS — Z79899 Other long term (current) drug therapy: Secondary | ICD-10-CM | POA: Insufficient documentation

## 2015-11-20 DIAGNOSIS — N189 Chronic kidney disease, unspecified: Secondary | ICD-10-CM | POA: Insufficient documentation

## 2015-11-20 LAB — URINALYSIS, ROUTINE W REFLEX MICROSCOPIC
BILIRUBIN URINE: NEGATIVE
Glucose, UA: >1000 mg/dL — AB
KETONES UR: NEGATIVE mg/dL
LEUKOCYTES UA: NEGATIVE
NITRITE: NEGATIVE
Specific Gravity, Urine: 1.023 (ref 1.005–1.030)
pH: 5.5 (ref 5.0–8.0)

## 2015-11-20 LAB — CBG MONITORING, ED
GLUCOSE-CAPILLARY: 563 mg/dL — AB (ref 65–99)
Glucose-Capillary: 564 mg/dL (ref 65–99)

## 2015-11-20 LAB — COMPREHENSIVE METABOLIC PANEL
ALT: 8 U/L — ABNORMAL LOW (ref 14–54)
ANION GAP: 7 (ref 5–15)
AST: 10 U/L — ABNORMAL LOW (ref 15–41)
Albumin: 2.9 g/dL — ABNORMAL LOW (ref 3.5–5.0)
Alkaline Phosphatase: 154 U/L — ABNORMAL HIGH (ref 38–126)
BUN: 32 mg/dL — ABNORMAL HIGH (ref 6–20)
CHLORIDE: 99 mmol/L — AB (ref 101–111)
CO2: 23 mmol/L (ref 22–32)
Calcium: 9.1 mg/dL (ref 8.9–10.3)
Creatinine, Ser: 2.04 mg/dL — ABNORMAL HIGH (ref 0.44–1.00)
GFR, EST AFRICAN AMERICAN: 33 mL/min — AB (ref >60)
GFR, EST NON AFRICAN AMERICAN: 28 mL/min — AB (ref >60)
Glucose, Bld: 588 mg/dL (ref 65–99)
POTASSIUM: 4.5 mmol/L (ref 3.5–5.1)
Sodium: 129 mmol/L — ABNORMAL LOW (ref 135–145)
Total Bilirubin: 0.2 mg/dL — ABNORMAL LOW (ref 0.3–1.2)
Total Protein: 7.6 g/dL (ref 6.5–8.1)

## 2015-11-20 LAB — CBC WITH DIFFERENTIAL/PLATELET
BASOS ABS: 0 10*3/uL (ref 0.0–0.1)
Basophils Relative: 0 %
EOS PCT: 1 %
Eosinophils Absolute: 0.1 10*3/uL (ref 0.0–0.7)
HCT: 30.3 % — ABNORMAL LOW (ref 36.0–46.0)
Hemoglobin: 9.8 g/dL — ABNORMAL LOW (ref 12.0–15.0)
LYMPHS ABS: 3.1 10*3/uL (ref 0.7–4.0)
LYMPHS PCT: 30 %
MCH: 24.9 pg — AB (ref 26.0–34.0)
MCHC: 32.3 g/dL (ref 30.0–36.0)
MCV: 77.1 fL — AB (ref 78.0–100.0)
MONO ABS: 0.4 10*3/uL (ref 0.1–1.0)
Monocytes Relative: 4 %
Neutro Abs: 6.9 10*3/uL (ref 1.7–7.7)
Neutrophils Relative %: 65 %
PLATELETS: 439 10*3/uL — AB (ref 150–400)
RBC: 3.93 MIL/uL (ref 3.87–5.11)
RDW: 12.5 % (ref 11.5–15.5)
WBC: 10.6 10*3/uL — ABNORMAL HIGH (ref 4.0–10.5)

## 2015-11-20 LAB — URINE MICROSCOPIC-ADD ON

## 2015-11-20 LAB — I-STAT TROPONIN, ED: TROPONIN I, POC: 0 ng/mL (ref 0.00–0.08)

## 2015-11-20 MED ORDER — "INSULIN SYRINGE 27G X 1/2"" 0.5 ML MISC": 1.0000 | Freq: Every day | 0 refills | Status: DC

## 2015-11-20 MED ORDER — SODIUM CHLORIDE 0.9 % IV SOLN
INTRAVENOUS | Status: DC
  Administered 2015-11-21: 5 [IU]/h via INTRAVENOUS
  Filled 2015-11-20: qty 2.5

## 2015-11-20 MED ORDER — SODIUM CHLORIDE 0.9 % IV BOLUS (SEPSIS)
1000.0000 mL | Freq: Once | INTRAVENOUS | Status: AC
  Administered 2015-11-20: 1000 mL via INTRAVENOUS

## 2015-11-20 MED ORDER — SODIUM CHLORIDE 0.9 % IV BOLUS (SEPSIS)
1000.0000 mL | Freq: Once | INTRAVENOUS | Status: AC
Start: 2015-11-20 — End: 2015-11-21
  Administered 2015-11-21: 1000 mL via INTRAVENOUS

## 2015-11-20 MED ORDER — INSULIN NPH (HUMAN) (ISOPHANE) 100 UNIT/ML ~~LOC~~ SUSP: 40.0000 [IU] | Freq: Every day | SUBCUTANEOUS | 11 refills | Status: DC

## 2015-11-20 MED ORDER — INSULIN REGULAR HUMAN 100 UNIT/ML IJ SOLN: 20.0000 [IU] | Freq: Three times a day (TID) | INTRAMUSCULAR | 11 refills | Status: DC

## 2015-11-20 MED ORDER — GABAPENTIN 100 MG PO CAPS: 400.0000 mg | ORAL_CAPSULE | Freq: Two times a day (BID) | ORAL | 0 refills | Status: DC

## 2015-11-20 MED ORDER — SODIUM CHLORIDE 0.9 % IV SOLN
INTRAVENOUS | Status: DC
  Administered 2015-11-21: 02:00:00 via INTRAVENOUS

## 2015-11-20 MED ORDER — DEXTROSE-NACL 5-0.45 % IV SOLN: INTRAVENOUS | Status: DC

## 2015-11-20 NOTE — ED Triage Notes (Addendum)
Pt st;s her blood sugar has been over 500 off and on for over 2 weeks.  Also c/o her vision being cloudy.  St's she has had her insulin x's 2 today.  Pt st's she has had upper chest pain off and on x's 1 month.

## 2015-11-20 NOTE — ED Notes (Signed)
Dr. Dayna Barker notified of pt glucose.

## 2015-11-20 NOTE — ED Provider Notes (Addendum)
[facility] DEPT Provider Note   CSN: [id number] Arrival date & time: 11/20/15  1812     History   Chief Complaint Chief Complaint  Patient presents with  . Hyperglycemia    HPI Susan Horton is a 45 y.o. female.  HPI complains of general malaise for the past 2 weeks. She vomited 5 times yesterday and twice today. The last time she vomited was 9 AM today. She ate while in the waiting room without nausea or vomiting. She also reports that her blood sugar has been elevated for several weeks. She denies noncompliance with insulin. Patient also reports cough for several weeks and chest pain for several weeks, waxes and wanes constant, worse with exertion. Also complains of bilateral foot pain for several years. She is treated with gabapentin for diabetic neuropathy, without relief. Treated with insulin, without improvement of blood sugars. She is followed by endocrinologist Dr.Gherge and by [facility] as primary care physician  Past Medical History:  Diagnosis Date  . Asthma    prn inhaler  . Diabetes mellitus    IDDM  . DVT (deep venous thrombosis) ([place])    20+ yrs ago (during pregnancy), 2015  . GERD (gastroesophageal reflux disease)   . Headache(784.0)    migraines  . Hidradenitis    Diabetic gastroparesis Patient Active Problem List   Diagnosis Date Noted  . Diabetic gastroparesis ([place])   . Chest pain 06/05/2014  . Intractable nausea and vomiting 06/05/2014  . Uncontrolled type 2 diabetes mellitus with peripheral neuropathy ([place]) 06/05/2014  . Normocytic anemia 06/05/2014  . Hyperglycemia   . Monilial vaginitis 07/10/2010    Past Surgical History:  Procedure Laterality Date  . APPENDECTOMY  1995  . BREAST REDUCTION SURGERY  2002  . CHOLECYSTECTOMY  1993  . EXPLORATORY LAPAROTOMY  08/14/2005   lysis of adhesions, drainage of tubo-ovarian abscess  . HYDRADENITIS EXCISION  02/08/2011   Procedure: EXCISION HYDRADENITIS GROIN;  Surgeon: [name], MD;   Location: [facility];  Service: General;  Laterality: N/A;  Excisioin of Hidradenitis Left groin  . INGUINAL HIDRADENITIS EXCISION  07/06/2010   bilateral  . TONSILLECTOMY    . TUBAL LIGATION  1996  . tummy tuck    . VAGINAL HYSTERECTOMY  08/04/2005   and cysto    OB History    No data available       Home Medications    Prior to Admission medications   Medication Sig Start Date End Date Taking? Authorizing Provider  albuterol (PROVENTIL HFA;VENTOLIN HFA) 108 (90 BASE) MCG/ACT inhaler Inhale 1 puff into the lungs every 6 (six) hours as needed for wheezing or shortness of breath.   Yes Historical Provider, MD  Fluticasone-Salmeterol (ADVAIR) 250-50 MCG/DOSE AEPB Inhale 1 puff into the lungs 2 (two) times daily as needed (FOR SHORTNESS OF BREATH).    Yes Historical Provider, MD  insulin NPH Human (NOVOLIN N RELION) 100 UNIT/ML injection Inject 40 units in am and 30 units before dinner 08/21/15  Yes Philemon Kingdom, MD  losartan (COZAAR) 100 MG tablet Take 100 mg by mouth daily.   Yes Historical Provider, MD  lovastatin (MEVACOR) 20 MG tablet Take 20 mg by mouth daily.   Yes Historical Provider, MD  montelukast (SINGULAIR) 10 MG tablet Take 10 mg by mouth daily as needed (allergies).    Yes Historical Provider, MD  omeprazole (PRILOSEC) 40 MG capsule Take 40 mg by mouth at bedtime.    Yes Historical Provider, MD  fluconazole (  DIFLUCAN) 150 MG tablet Take 1 tablet (150 mg total) by mouth daily. Patient not taking: Reported on 11/20/2015 08/26/15   Carlisle Cater, PA-C  glucose blood (ONETOUCH VERIO) test strip Use to test blood sugar 4 times daily as instructed. Dx: E11.9 09/03/14   Philemon Kingdom, MD  insulin regular (NOVOLIN R RELION) 100 units/mL injection Inject 20-25 units before meals  - 3x a day Patient not taking: Reported on 11/20/2015 08/21/15   Philemon Kingdom, MD  Insulin Syringe-Needle U-100 (RELION INSULIN SYR 0.5ML/31G) 31G X 5/16" 0.5 ML MISC Use 3x a day  08/21/15   Philemon Kingdom, MD  nitrofurantoin, macrocrystal-monohydrate, (MACROBID) 100 MG capsule Take 1 capsule (100 mg total) by mouth 2 (two) times daily. Patient not taking: Reported on 11/20/2015 08/26/15   Carlisle Cater, PA-C  ONETOUCH DELICA LANCETS FINE MISC Use to test blood sugar 4 times daily as instructed. Dx: E11.9 09/03/14   Philemon Kingdom, MD    Family History Family History  Problem Relation Age of Onset  . Hypertension Father   . Hypertension Sister   . Hypertension Brother     Social History Social History  Substance Use Topics  . Smoking status: Current Every Day Smoker    Packs/day: 0.50    Years: 20.00    Types: Cigarettes  . Smokeless tobacco: Never Used     Comment: 1 pack/week  . Alcohol use No     Allergies   Penicillins and Penicillin g   Review of Systems Review of Systems  HENT: Negative.   Respiratory: Positive for cough.   Cardiovascular: Positive for chest pain.  Gastrointestinal: Positive for vomiting.       No nausea at present  Endocrine: Positive for polydipsia.  Genitourinary: Positive for dysuria.  Musculoskeletal: Positive for arthralgias.       Bilateral foot pain  Skin: Negative.   Allergic/Immunologic: Positive for immunocompromised state.  Neurological: Positive for weakness.       Generalized weakness  Psychiatric/Behavioral: Negative.      Physical Exam Updated Vital Signs BP (!) 177/101   Pulse 104   Temp 98.4 F (36.9 C) (Oral)   Resp 13   Ht 5\' 5"  (1.651 m)   Wt 185 lb (83.9 kg)   SpO2 100%   BMI 30.79 kg/m   Physical Exam  Constitutional: She is oriented to person, place, and time. She appears well-developed and well-nourished. No distress.  HENT:  Head: Normocephalic and atraumatic.  Mucous membranes dry  Eyes: Conjunctivae are normal. Pupils are equal, round, and reactive to light.  Neck: Neck supple. No tracheal deviation present. No thyromegaly present.  Cardiovascular: Regular rhythm.   No  murmur heard. Mildly tachycardic  Pulmonary/Chest: Effort normal and breath sounds normal.  Abdominal: Soft. Bowel sounds are normal. She exhibits no distension. There is no tenderness.  Musculoskeletal: Normal range of motion. She exhibits no edema or tenderness.  4 extremities without redness swelling or tenderness neurovascularly intact  Neurological: She is alert and oriented to person, place, and time. No cranial nerve deficit. Coordination normal.  Skin: Skin is warm and dry. Capillary refill takes less than 2 seconds. No rash noted.  Psychiatric: She has a normal mood and affect.  Nursing note and vitals reviewed.    ED Treatments / Results  Labs (all labs ordered are listed, but only abnormal results are displayed) Labs Reviewed  CBC WITH DIFFERENTIAL/PLATELET - Abnormal; Notable for the following:       Result Value   WBC 10.6 (*)  Hemoglobin 9.8 (*)    HCT 30.3 (*)    MCV 77.1 (*)    MCH 24.9 (*)    Platelets 439 (*)    All other components within normal limits  COMPREHENSIVE METABOLIC PANEL - Abnormal; Notable for the following:    Sodium 129 (*)    Chloride 99 (*)    Glucose, Bld 588 (*)    BUN 32 (*)    Creatinine, Ser 2.04 (*)    Albumin 2.9 (*)    AST 10 (*)    ALT 8 (*)    Alkaline Phosphatase 154 (*)    Total Bilirubin 0.2 (*)    GFR calc non Af Amer 28 (*)    GFR calc Af Amer 33 (*)    All other components within normal limits  CBG MONITORING, ED - Abnormal; Notable for the following:    Glucose-Capillary 564 (*)    All other components within normal limits  I-STAT TROPOININ, ED    EKG  EKG Interpretation  Date/Time:  Thursday November 20 2015 18:18:43 EST Ventricular Rate:  108 PR Interval:  166 QRS Duration: 78 QT Interval:  318 QTC Calculation: 426 R Axis:   59 Text Interpretation:  Sinus tachycardia Otherwise normal ECG No significant change since last tracing Reconfirmed by Winfred Leeds  MD, Lovie Agresta [phone]) on 11/20/2015 8:34:54 PM        Radiology No results found.  Procedures Procedures (including critical care time)  Medications Ordered in ED Medications  dextrose 5 %-0.45 % sodium chloride infusion (not administered)  insulin regular (NOVOLIN R,HUMULIN R) 250 Units in sodium chloride 0.9 % 250 mL (1 Units/mL) infusion (not administered)  sodium chloride 0.9 % bolus 1,000 mL (not administered)    And  sodium chloride 0.9 % bolus 1,000 mL (not administered)    And  0.9 %  sodium chloride infusion (not administered)     Initial Impression / Assessment and Plan / ED Course  I have reviewed the triage vital signs and the nursing notes.  Pertinent labs & imaging results that were available during my care of the patient were reviewed by me and considered in my medical decision making (see chart for details).  Clinical Course     Glucose stabilizer with intravenous insulin drip and intravenous fluids ordered. Patient is poorly controlled diabetic,, long-standing. She also has long-standing chronic renal insufficiency which is unchanged. Anemia is also chronic. I've written prescriptions for insulin NPH, insulin regular, gabapentin and insulin syringes, as patient has run out of those. Case management consulted. Saw patient in the ED and arrange for outpatient follow-up for diabetes management. Heart Score equals 3. Chest pain felt to be atypical with constant prolonged chest pain, negative troponin normal EKG Patient signed out to Dr. Lita Mains at 12:55 AM Final Clinical Impressions(s) / ED Diagnoses   Final diagnoses:  None   Diagnosis #1 hyperglycemia #2 chronic renal insufficiency #3 anemia #4atypical chest pain #5 tobacco abuse #6 medication refill New Prescriptions New Prescriptions   No medications on file     Orlie Dakin, MD 11/21/15 0100    Orlie Dakin, MD 11/21/15 0100

## 2015-11-21 ENCOUNTER — Telehealth: Payer: Self-pay

## 2015-11-21 LAB — CBG MONITORING, ED
GLUCOSE-CAPILLARY: 528 mg/dL — AB (ref 65–99)
Glucose-Capillary: 356 mg/dL — ABNORMAL HIGH (ref 65–99)
Glucose-Capillary: 184 mg/dL — ABNORMAL HIGH (ref 65–99)

## 2015-11-21 NOTE — Telephone Encounter (Signed)
This Case Manager received communication from [place], RN CM with [facility] that patient needing an ED follow-up appointment. Patient uninsured and does not have a PCP. This Case Manager placed call to patient who confirmed appointment needed. ED follow-up appointment scheduled for 11/25/15 at 1100 with Zettie Pho, PA. Patient was given a [employer] letter for her medications on 11/20/15. Patient indicated she has obtained all discharge medications. Instructed patient to bring all of her medications to upcoming appointment. Patient verbalized understanding.

## 2015-11-21 NOTE — ED Notes (Signed)
CBG:184 RN notified.  

## 2015-11-25 ENCOUNTER — Ambulatory Visit: Payer: Self-pay | Attending: Internal Medicine | Admitting: Physician Assistant

## 2015-11-25 VITALS — BP 160/90 | HR 69 | Temp 98.3°F | Resp 20 | Ht 65.0 in | Wt 175.0 lb

## 2015-11-25 DIAGNOSIS — E1165 Type 2 diabetes mellitus with hyperglycemia: Secondary | ICD-10-CM | POA: Insufficient documentation

## 2015-11-25 DIAGNOSIS — Z794 Long term (current) use of insulin: Secondary | ICD-10-CM | POA: Insufficient documentation

## 2015-11-25 DIAGNOSIS — G43909 Migraine, unspecified, not intractable, without status migrainosus: Secondary | ICD-10-CM | POA: Insufficient documentation

## 2015-11-25 DIAGNOSIS — K3184 Gastroparesis: Secondary | ICD-10-CM | POA: Insufficient documentation

## 2015-11-25 DIAGNOSIS — J45909 Unspecified asthma, uncomplicated: Secondary | ICD-10-CM | POA: Insufficient documentation

## 2015-11-25 DIAGNOSIS — K219 Gastro-esophageal reflux disease without esophagitis: Secondary | ICD-10-CM | POA: Insufficient documentation

## 2015-11-25 DIAGNOSIS — I129 Hypertensive chronic kidney disease with stage 1 through stage 4 chronic kidney disease, or unspecified chronic kidney disease: Secondary | ICD-10-CM | POA: Insufficient documentation

## 2015-11-25 DIAGNOSIS — I10 Essential (primary) hypertension: Secondary | ICD-10-CM

## 2015-11-25 DIAGNOSIS — IMO0002 Reserved for concepts with insufficient information to code with codable children: Secondary | ICD-10-CM

## 2015-11-25 DIAGNOSIS — E1343 Other specified diabetes mellitus with diabetic autonomic (poly)neuropathy: Secondary | ICD-10-CM | POA: Insufficient documentation

## 2015-11-25 DIAGNOSIS — E1143 Type 2 diabetes mellitus with diabetic autonomic (poly)neuropathy: Secondary | ICD-10-CM

## 2015-11-25 DIAGNOSIS — Z86718 Personal history of other venous thrombosis and embolism: Secondary | ICD-10-CM | POA: Insufficient documentation

## 2015-11-25 DIAGNOSIS — E1142 Type 2 diabetes mellitus with diabetic polyneuropathy: Secondary | ICD-10-CM

## 2015-11-25 DIAGNOSIS — N184 Chronic kidney disease, stage 4 (severe): Secondary | ICD-10-CM | POA: Insufficient documentation

## 2015-11-25 DIAGNOSIS — E1122 Type 2 diabetes mellitus with diabetic chronic kidney disease: Secondary | ICD-10-CM | POA: Insufficient documentation

## 2015-11-25 LAB — POCT URINALYSIS DIPSTICK
BILIRUBIN UA: NEGATIVE
Glucose, UA: 500
KETONES UA: NEGATIVE
LEUKOCYTES UA: NEGATIVE
Nitrite, UA: NEGATIVE
PH UA: 6
SPEC GRAV UA: 1.01
Urobilinogen, UA: 0.2

## 2015-11-25 LAB — POCT GLYCOSYLATED HEMOGLOBIN (HGB A1C)

## 2015-11-25 LAB — GLUCOSE, POCT (MANUAL RESULT ENTRY)

## 2015-11-25 MED ORDER — FLUCONAZOLE 150 MG PO TABS: 150.0000 mg | ORAL_TABLET | Freq: Every day | ORAL | 0 refills | Status: DC

## 2015-11-25 MED ORDER — INSULIN REGULAR HUMAN 100 UNIT/ML IJ SOLN: 25.0000 [IU] | Freq: Three times a day (TID) | INTRAMUSCULAR | 3 refills | Status: DC

## 2015-11-25 MED ORDER — INSULIN NPH (HUMAN) (ISOPHANE) 100 UNIT/ML ~~LOC~~ SUSP: 50.0000 [IU] | Freq: Every day | SUBCUTANEOUS | 11 refills | Status: DC

## 2015-11-25 MED ORDER — CLONIDINE HCL 0.1 MG PO TABS
0.2000 mg | ORAL_TABLET | Freq: Once | ORAL | Status: AC
  Administered 2015-11-25: 0.2 mg via ORAL

## 2015-11-25 MED ORDER — METOPROLOL TARTRATE 25 MG PO TABS: 25.0000 mg | ORAL_TABLET | Freq: Two times a day (BID) | ORAL | 0 refills | Status: DC

## 2015-11-25 MED ORDER — IRON 325 (65 FE) MG PO TABS: 325.0000 mg | ORAL_TABLET | Freq: Every day | ORAL | 3 refills | Status: DC

## 2015-11-25 MED ORDER — LOSARTAN POTASSIUM 100 MG PO TABS: 100.0000 mg | ORAL_TABLET | Freq: Every day | ORAL | 3 refills | Status: DC

## 2015-11-25 NOTE — Patient Instructions (Signed)
Increase her NPH insulin to 40 Units in the  morning and 40 units in the  evening  Increase your regular insulin to 25 units 3 times daily with meals  Stop smoking   Exercise 3 times weekly for 45 min each time  See you on Friday morning

## 2015-11-25 NOTE — Progress Notes (Signed)
Susan Horton  VOZ:[id number]  [id number]  DOB - [birth year]  Chief Complaint  Patient presents with  . Follow-up  . Diabetes  . Hypertension       Subjective:   Susan Horton is a 45 y.o. female here today for establishment of care. She has a hx of uncontrolled DM, HTN, smoking, Asthma, and CKD stage 3.  She was in the ED on 11/9 most recently with malaise, vomiting and out of meds for a couple of days. She also had bilateral foot pain and numbness. She has blurred vision. She was negative for ketones. She was treated with an Insulin gtt and IVFs. Sent home with refills and told to come here.   Since discharge has been compliant with her regimen however continues to smoke. Less nausea. No CP. Still numb in feet and vision worsening. C/o leg pain. Is not working currently, has an interview on Fri.   ROS: GEN: denies fever or chills, denies change in weight Skin: denies lesions or rashes HEENT: denies headache, earache, epistaxis, sore throat, or neck pain LUNGS: denies SHOB, dyspnea, PND, orthopnea CV: denies CP or palpitations ABD: denies abd pain,+ N or V EXT: denies muscle spasms or swelling; + pain in lower ext, no weakness NEURO: + numbness or tingling, denies sz, stroke or TIA   ALLERGIES: Allergies  Allergen Reactions  . Penicillins Anaphylaxis, Itching, Swelling and Rash    Swelling of throat and whole mouth Has patient had a PCN reaction causing immediate rash, facial/tongue/throat swelling, SOB or lightheadedness with hypotension: YES Has patient had a PCN reaction causing severe rash involving mucus membranes or skin necrosis:NO Has patient had a PCN reaction that required hospitalization NO Has patient had a PCN reaction occurring within the last 10 years:NO If all of the above answers are "NO", then may proceed with Cephalosporin use.  [name] Lisinopril Cough  . Penicillin G Swelling and Rash    PAST MEDICAL HISTORY: Past Medical History:  Diagnosis Date  .  Asthma    prn inhaler  . Diabetes mellitus    IDDM  . DVT (deep venous thrombosis) ([place])    20+ yrs ago (during pregnancy), 2015  . GERD (gastroesophageal reflux disease)   . Headache(784.0)    migraines  . Hidradenitis     PAST SURGICAL HISTORY: Past Surgical History:  Procedure Laterality Date  . APPENDECTOMY  1995  . BREAST REDUCTION SURGERY  2002  . CHOLECYSTECTOMY  1993  . EXPLORATORY LAPAROTOMY  08/14/2005   lysis of adhesions, drainage of tubo-ovarian abscess  . HYDRADENITIS EXCISION  02/08/2011   Procedure: EXCISION HYDRADENITIS GROIN;  Surgeon: [name], MD;  Location: [facility];  Service: General;  Laterality: N/A;  Excisioin of Hidradenitis Left groin  . INGUINAL HIDRADENITIS EXCISION  07/06/2010   bilateral  . TONSILLECTOMY    . TUBAL LIGATION  1996  . tummy tuck    . VAGINAL HYSTERECTOMY  08/04/2005   and cysto    MEDICATIONS AT HOME: Prior to Admission medications   Medication Sig Start Date End Date Taking? Authorizing Provider  albuterol (PROVENTIL HFA;VENTOLIN HFA) 108 (90 BASE) MCG/ACT inhaler Inhale 1 puff into the lungs every 6 (six) hours as needed for wheezing or shortness of breath.   Yes Historical Provider, MD  Ferrous Sulfate (IRON) 325 (65 Fe) MG TABS Take 325 mg by mouth daily. 11/25/15  Yes Peter Daquila Daneil Dan, PA-C  Fluticasone-Salmeterol (ADVAIR) 250-50 MCG/DOSE AEPB Inhale 1 puff into the lungs 2 (two)  times daily as needed (FOR SHORTNESS OF BREATH).    Yes Historical Provider, MD  gabapentin (NEURONTIN) 400 MG capsule Take 400 mg by mouth 2 (two) times daily.   Yes Historical Provider, MD  insulin NPH Human (HUMULIN N) 100 UNIT/ML injection Inject 0.5 mLs (50 Units total) into the skin daily before breakfast. 40 units sq before dinner 11/25/15  Yes Amanda Steuart S Meleny [place]oning, PA-C  insulin regular (HUMULIN R) 100 units/mL injection Inject 0.2 mLs (20 Units total) into the skin 3 (three) times daily before meals. 11/20/15  Yes Orlie Dakin,  MD  losartan (COZAAR) 100 MG tablet Take 1 tablet (100 mg total) by mouth daily. 11/25/15  Yes Maureena Dabbs Daneil Dan, PA-C  lovastatin (MEVACOR) 20 MG tablet Take 20 mg by mouth daily.   Yes Historical Provider, MD  montelukast (SINGULAIR) 10 MG tablet Take 10 mg by mouth daily as needed (allergies).    Yes Historical Provider, MD  omeprazole (PRILOSEC) 40 MG capsule Take 40 mg by mouth at bedtime.    Yes Historical Provider, MD  fluconazole (DIFLUCAN) 150 MG tablet Take 1 tablet (150 mg total) by mouth daily. 11/25/15   Brayton Caves, PA-C  gabapentin (NEURONTIN) 100 MG capsule Take 4 capsules (400 mg total) by mouth 2 (two) times daily. 11/20/15   Orlie Dakin, MD  insulin NPH Human (HUMULIN N,NOVOLIN N) 100 UNIT/ML injection Inject 30-40 Units into the skin See admin instructions. Takes 40 units in am and 30 units in pm    Historical Provider, MD  insulin regular (NOVOLIN R,HUMULIN R) 250 units/2.16mL (100 units/mL) injection Inject 0.25 mLs (25 Units total) into the skin 3 (three) times daily before meals. Sliding scale 11/25/15   Brayton Caves, PA-C  Insulin Syringe 27G X 1/2" 0.5 ML MISC 1 Syringe by Does not apply route 5 (five) times daily. 11/20/15   Orlie Dakin, MD  metoprolol tartrate (LOPRESSOR) 25 MG tablet Take 1 tablet (25 mg total) by mouth 2 (two) times daily. 11/25/15   Brayton Caves, PA-C  nitrofurantoin, macrocrystal-monohydrate, (MACROBID) 100 MG capsule Take 1 capsule (100 mg total) by mouth 2 (two) times daily. Patient not taking: Reported on 11/25/2015 08/26/15   Carlisle Cater, PA-C     Objective:  Recheck 160/86 after Clonidine 0.2 Vitals:   11/25/15 1049  BP: (!) 160/100  Pulse: 69  Resp: 20  Temp: 98.3 F (36.8 C)  TempSrc: Oral  SpO2: 97%  Weight: 175 lb (79.4 kg)  Height: 5\' 5"  (1.651 m)    Exam-benign General appearance : Awake, alert, not in any distress. Speech Clear. Not toxic looking HEENT: Atraumatic and Normocephalic, pupils equally reactive to light  and accomodation Neck: supple, no JVD. No cervical lymphadenopathy.  Chest:Good air entry bilaterally, no added sounds  CVS: S1 S2 regular, no murmurs.  Abdomen: Bowel sounds present, Non tender and not distended with no gaurding, rigidity or rebound. Extremities: B/L Lower Ext shows no edema, both legs are warm to touch Neurology: Awake alert, and oriented X 3, CN II-XII intact, Non focal Skin:No Rash Wounds:N/A  Data Review Lab Results  Component Value Date   HGBA1C >15 11/25/2015   HGBA1C 12.4 09/03/2014   HGBA1C 11.6 (H) 08/22/2013  U/A-no ketones today; BS too high to read   Assessment & Plan  1. Uncontrolled DM w/ gastroparesis, nephropathy, and neuropathy  -increased NPH 50 U am and 40 U pm  -increased Regular 25 U TID with meals  -Keep log next 3 days and RTC  on Friday with Pharm D  -discussed carb counting and diet Aim for 30 minutes of exercise most days. Rethink what you drink. Water is great! Aim for 2-3 Carb Choices per meal (30-45 grams) +/- 1 either way  Aim for 0-15 Carbs per snack if hungry  Include protein in moderation with your meals and snacks  Consider reading food labels for Total Carbohydrate and Fat Grams of foods  Consider checking BG at alternate times per day  Continue taking medication as directed Be mindful about how much sugar you are adding to beverages and other foods. Fruit Punch - find one with no sugar  Measure and decrease portions of carbohydrate foods  Make your plate and don't go back for seconds  -referral to Endocrine  -refill gabapentin  -[facility] referral 2. HTN-not controlled  -Given Clonidine today  -RF modification-stop smoking; low salt; exercise  -Refilled Cozaar, added Lopressor 3. CKD 3-4  -aggressive mgmt of BP and DM  -Cont ARB   Return in about 3 days (around 11/28/2015). with Pharm D RTC 7-10 days with PCP  The patient was given clear instructions to go to ER or return to medical center if symptoms don't  improve, worsen or new problems develop. The patient verbalized understanding. The patient was told to call to get lab results if they haven't heard anything in the next week.   This note has been created with Surveyor, quantity. Any transcriptional errors are unintentional.    Zettie Pho, PA-C [facility] and [facility] [place], [place]   11/25/2015, 12:15 PM

## 2015-11-25 NOTE — Progress Notes (Signed)
F/u diabetes. C/o leg/foot pain.

## 2015-11-27 ENCOUNTER — Ambulatory Visit: Payer: Self-pay | Attending: Internal Medicine | Admitting: Pharmacist

## 2015-11-27 VITALS — BP 186/103 | HR 99

## 2015-11-27 DIAGNOSIS — I10 Essential (primary) hypertension: Secondary | ICD-10-CM | POA: Insufficient documentation

## 2015-11-27 DIAGNOSIS — Z79899 Other long term (current) drug therapy: Secondary | ICD-10-CM | POA: Insufficient documentation

## 2015-11-27 DIAGNOSIS — E1140 Type 2 diabetes mellitus with diabetic neuropathy, unspecified: Secondary | ICD-10-CM | POA: Insufficient documentation

## 2015-11-27 DIAGNOSIS — Z794 Long term (current) use of insulin: Secondary | ICD-10-CM | POA: Insufficient documentation

## 2015-11-27 MED ORDER — AMLODIPINE BESYLATE 5 MG PO TABS: 5.0000 mg | ORAL_TABLET | Freq: Every day | ORAL | 2 refills | Status: DC

## 2015-11-27 MED ORDER — INSULIN NPH (HUMAN) (ISOPHANE) 100 UNIT/ML ~~LOC~~ SUSP: SUBCUTANEOUS | 2 refills | Status: DC

## 2015-11-27 MED ORDER — GABAPENTIN 100 MG PO CAPS: 600.0000 mg | ORAL_CAPSULE | Freq: Two times a day (BID) | ORAL | 0 refills | Status: DC

## 2015-11-27 NOTE — Patient Instructions (Addendum)
Thanks for coming to see [name]!  Start amlodipine 5 mg daily for blood pressure  Increase NPH insulin to 60 in the morning and 50 units in the evening  Increase gabapentin to 600 mg (6 capsules) twice a day - be careful, it will make you sleepy, so take it at night to see how it will affect you. Do not drive or operate machinery until you know how the dose increase will affect you.  Follow up with Dr. Doreene Burke in 2 weeks.

## 2015-11-27 NOTE — Progress Notes (Signed)
    S:    Patient arrives in good spirits.  Presents for diabetes evaluation, education, and management at the request of Elio Forget and Dr. Doreene Burke. Patient was referred on 11/25/15.    Patient reports adherence with medications.  Current diabetes medications include:NPH 50 units in the morning and 40 units at night, and regular insulin 25-30 units TID.  Current hypertension medications include: losartan 100 mg daily and metoprolol 25 mg BID  Patient denies hypoglycemic events.  Patient reported dietary habits: Eats 3 meals/day. Very focused on eating vegetables and meats and doesn't understand why her blood glucose continues to be so elevated.  Patient reported exercise habits: she cannot exercise due to neuropathy   Patient reports nocturia.  Patient reports neuropathy. Patient reports visual changes. Patient reports self foot exams.   Patient reports that she had to recently quit her job because her diabetic neuropathy is so bad that she is unable to work. Therefore, she has also lost her insurance.    O:  Lab Results  Component Value Date   HGBA1C >15 11/25/2015   Vitals:   11/27/15 1114  BP: (!) 186/103  Pulse: 99    Home fasting CBG: 300s-400s 2 hour post-prandial/random CBG: 300s-500s  A/P: Diabetes longstanding currently UNcontrolled based on A1c >15 and home CBGs. Patient denies hypoglycemic events and is able to verbalize appropriate hypoglycemia management plan. Patient reports adherence with medication. I am not sure why her body is so insulin resistant. No signs of infection. She does have significant stress from having to quit her job due to neuropathy.  Increase NPH insulin to 60 units in the morning and 50 units at night. Continue regular insulin. Uncontrolled neuropathy secondary to uncontrolled diabetes - increased gabapentin to 600 mg BID and instructed patient to avoid driving or operating machinery until she knew how the dose increase would help  her. I think the most helpful thing would be to get her blood glucose under control but hopefully the dose increase in the gabapentin will help for now.   Next A1C anticipated February 2018.    Hypertension longstanding currently uncontrolled on current medications.  Patient reports adherence with medication. Control is suboptimal due to pain. Continue metoprolol tartrate 25 mg BID and losartan 100 mg and initiate amlodipine 5 mg daily with plans to increase to 10 mg daily if needed. Reviewed the medication with her including how to take and possible adverse effects.  Written patient instructions provided.  Total time in face to face counseling 30 minutes.   Follow up in Pharmacist Clinic Visit PRN, next visit with Dr. Doreene Burke in 2 weeks.   Patient seen with Biagio Borg, PharmD Candidate

## 2015-12-11 ENCOUNTER — Encounter: Payer: Self-pay | Admitting: Internal Medicine

## 2015-12-11 ENCOUNTER — Ambulatory Visit: Payer: Self-pay | Attending: Internal Medicine | Admitting: Internal Medicine

## 2015-12-11 ENCOUNTER — Ambulatory Visit: Payer: Self-pay | Admitting: Internal Medicine

## 2015-12-11 VITALS — BP 161/79 | HR 99 | Temp 98.2°F | Resp 18 | Ht 65.0 in | Wt 172.0 lb

## 2015-12-11 DIAGNOSIS — Z794 Long term (current) use of insulin: Secondary | ICD-10-CM | POA: Insufficient documentation

## 2015-12-11 DIAGNOSIS — N183 Chronic kidney disease, stage 3 (moderate): Secondary | ICD-10-CM | POA: Insufficient documentation

## 2015-12-11 DIAGNOSIS — G43909 Migraine, unspecified, not intractable, without status migrainosus: Secondary | ICD-10-CM | POA: Insufficient documentation

## 2015-12-11 DIAGNOSIS — IMO0002 Reserved for concepts with insufficient information to code with codable children: Secondary | ICD-10-CM

## 2015-12-11 DIAGNOSIS — E1142 Type 2 diabetes mellitus with diabetic polyneuropathy: Secondary | ICD-10-CM | POA: Insufficient documentation

## 2015-12-11 DIAGNOSIS — J45909 Unspecified asthma, uncomplicated: Secondary | ICD-10-CM | POA: Insufficient documentation

## 2015-12-11 DIAGNOSIS — K219 Gastro-esophageal reflux disease without esophagitis: Secondary | ICD-10-CM | POA: Insufficient documentation

## 2015-12-11 DIAGNOSIS — E1122 Type 2 diabetes mellitus with diabetic chronic kidney disease: Secondary | ICD-10-CM | POA: Insufficient documentation

## 2015-12-11 DIAGNOSIS — E1143 Type 2 diabetes mellitus with diabetic autonomic (poly)neuropathy: Secondary | ICD-10-CM | POA: Insufficient documentation

## 2015-12-11 DIAGNOSIS — E1165 Type 2 diabetes mellitus with hyperglycemia: Secondary | ICD-10-CM | POA: Insufficient documentation

## 2015-12-11 DIAGNOSIS — K3184 Gastroparesis: Secondary | ICD-10-CM | POA: Insufficient documentation

## 2015-12-11 DIAGNOSIS — I10 Essential (primary) hypertension: Secondary | ICD-10-CM

## 2015-12-11 DIAGNOSIS — I129 Hypertensive chronic kidney disease with stage 1 through stage 4 chronic kidney disease, or unspecified chronic kidney disease: Secondary | ICD-10-CM | POA: Insufficient documentation

## 2015-12-11 DIAGNOSIS — E1121 Type 2 diabetes mellitus with diabetic nephropathy: Secondary | ICD-10-CM

## 2015-12-11 HISTORY — DX: Essential (primary) hypertension: I10

## 2015-12-11 LAB — POCT URINALYSIS DIPSTICK
BILIRUBIN UA: NEGATIVE
Glucose, UA: 500
KETONES UA: NEGATIVE
LEUKOCYTES UA: NEGATIVE
Nitrite, UA: NEGATIVE
PH UA: 6
SPEC GRAV UA: 1.01
Urobilinogen, UA: 0.2

## 2015-12-11 LAB — GLUCOSE, POCT (MANUAL RESULT ENTRY)
POC GLUCOSE: 513 mg/dL — AB (ref 70–99)
POC Glucose: 489 mg/dl — AB (ref 70–99)

## 2015-12-11 MED ORDER — INSULIN GLARGINE 300 UNIT/ML ~~LOC~~ SOPN: 50.0000 [IU] | PEN_INJECTOR | Freq: Every day | SUBCUTANEOUS | 3 refills | Status: DC

## 2015-12-11 MED ORDER — METOCLOPRAMIDE HCL 5 MG PO TABS: 5.0000 mg | ORAL_TABLET | Freq: Three times a day (TID) | ORAL | 3 refills | Status: DC

## 2015-12-11 MED ORDER — INSULIN ASPART 100 UNIT/ML ~~LOC~~ SOLN
40.0000 [IU] | Freq: Once | SUBCUTANEOUS | Status: AC
  Administered 2015-12-11: 40 [IU] via SUBCUTANEOUS

## 2015-12-11 MED ORDER — AMLODIPINE BESYLATE 10 MG PO TABS: 10.0000 mg | ORAL_TABLET | Freq: Every day | ORAL | 3 refills | Status: DC

## 2015-12-11 MED FILL — !TOUJEO SOLOSTAR 300 UNITS/: 300/ML | 27 days supply | Qty: 3 | Fill #0

## 2015-12-11 NOTE — Progress Notes (Signed)
Susan Horton, is a 45 y.o. female  AJO:[id number]  NOB:[id number]  DOB - 03-Oct-1970  Chief Complaint  Patient presents with  . Diabetes  . Hypertension      Subjective:   Susan Horton is a 45 y.o. female with history of uncontrolled DM, HTN, nicotine use disorder, Asthma, and CKD stage 3 here today for follow up and medication management. She was in the ED on 11/9 with malaise, vomiting and she has been out of meds for a couple of days prior to ED visit. She also had bilateral foot pain and numbness from neuropathy. She has blurred vision from diabetic complication. She was treated with an Insulin gtt and IVFs in ED and sent home. She continues to have daily vomiting from diabetic gastroparesis, she claims she is adherent with medications and insulin and tired of been constantly uncontrolled. She used to be on insulin pump which usually controls her BS but since losing her insurance, she was taken off insulin pump and since then BS has been difficult to control. HbA1C is >15% and CBG in 400s and 500s.   Problem  Essential Hypertension, Benign    ALLERGIES: Allergies  Allergen Reactions  . Penicillins Anaphylaxis, Itching, Swelling and Rash    Swelling of throat and whole mouth Has patient had a PCN reaction causing immediate rash, facial/tongue/throat swelling, SOB or lightheadedness with hypotension: YES Has patient had a PCN reaction causing severe rash involving mucus membranes or skin necrosis:NO Has patient had a PCN reaction that required hospitalization NO Has patient had a PCN reaction occurring within the last 10 years:NO If all of the above answers are "NO", then may proceed with Cephalosporin use.  [name] Lisinopril Cough  . Penicillin G Swelling and Rash    PAST MEDICAL HISTORY: Past Medical History:  Diagnosis Date  . Asthma    prn inhaler  . Diabetes mellitus    IDDM  . DVT (deep venous thrombosis) ([place])    20+ yrs ago (during pregnancy), 2015  . Essential  hypertension, benign 12/11/2015  . GERD (gastroesophageal reflux disease)   . Headache(784.0)    migraines  . Hidradenitis     MEDICATIONS AT HOME: Prior to Admission medications   Medication Sig Start Date End Date Taking? Authorizing Provider  albuterol (PROVENTIL HFA;VENTOLIN HFA) 108 (90 BASE) MCG/ACT inhaler Inhale 1 puff into the lungs every 6 (six) hours as needed for wheezing or shortness of breath.   Yes Historical Provider, MD  amLODipine (NORVASC) 10 MG tablet Take 1 tablet (10 mg total) by mouth daily. 12/11/15  Yes [name], MD  Ferrous Sulfate (IRON) 325 (65 Fe) MG TABS Take 325 mg by mouth daily. 11/25/15  Yes Tiffany Daneil Dan, PA-C  Fluticasone-Salmeterol (ADVAIR) 250-50 MCG/DOSE AEPB Inhale 1 puff into the lungs 2 (two) times daily as needed (FOR SHORTNESS OF BREATH).    Yes Historical Provider, MD  gabapentin (NEURONTIN) 100 MG capsule Take 6 capsules (600 mg total) by mouth 2 (two) times daily. 11/27/15  Yes [name], MD  insulin regular (NOVOLIN R,HUMULIN R) 250 units/2.40mL (100 units/mL) injection Inject 0.25 mLs (25 Units total) into the skin 3 (three) times daily before meals. Sliding scale 11/25/15  Yes Brayton Caves, PA-C  Insulin Syringe 27G X 1/2" 0.5 ML MISC 1 Syringe by Does not apply route 5 (five) times daily. 11/20/15  Yes Orlie Dakin, MD  losartan (COZAAR) 100 MG tablet Take 1 tablet (100 mg total) by mouth daily. 11/25/15  Yes  Brayton Caves, PA-C  lovastatin (MEVACOR) 20 MG tablet Take 20 mg by mouth daily.   Yes Historical Provider, MD  metoprolol tartrate (LOPRESSOR) 25 MG tablet Take 1 tablet (25 mg total) by mouth 2 (two) times daily. 11/25/15  Yes Tiffany Daneil Dan, PA-C  montelukast (SINGULAIR) 10 MG tablet Take 10 mg by mouth daily as needed (allergies).    Yes Historical Provider, MD  omeprazole (PRILOSEC) 40 MG capsule Take 40 mg by mouth at bedtime.    Yes Historical Provider, MD  Insulin Glargine (TOUJEO SOLOSTAR) 300 UNIT/ML  SOPN Inject 50 Units into the skin at bedtime. 12/11/15   [name], MD  metoCLOPramide (REGLAN) 5 MG tablet Take 1 tablet (5 mg total) by mouth 4 (four) times daily -  before meals and at bedtime. 12/11/15   [name], MD    Objective:   Vitals:   12/11/15 1042  BP: (!) 161/79  Pulse: 99  Resp: 18  Temp: 98.2 F (36.8 C)  TempSrc: Oral  SpO2: 100%  Weight: 172 lb (78 kg)  Height: 5\' 5"  (1.651 m)   Exam General appearance : Awake, alert, not in any distress. Speech Clear. Not toxic looking HEENT: Atraumatic and Normocephalic, pupils equally reactive to light and accomodation Neck: Supple, no JVD. No cervical lymphadenopathy.  Chest: Good air entry bilaterally, no added sounds  CVS: S1 S2 regular, no murmurs.  Abdomen: Bowel sounds present, Non tender and not distended with no gaurding, rigidity or rebound. Extremities: B/L Lower Ext shows no edema, both legs are warm to touch Neurology: Awake alert, and oriented X 3, CN II-XII intact, Non focal Skin: No Rash  Data Review Lab Results  Component Value Date   HGBA1C >15 11/25/2015   HGBA1C 12.4 09/03/2014   HGBA1C 11.6 (H) 08/22/2013    Assessment & Plan   1. Uncontrolled type 2 diabetes mellitus with peripheral neuropathy (HCC)  - Glucose (CBG) - Microalbumin/Creatinine Ratio, Urine - Urinalysis Dipstick - insulin aspart (novoLOG) injection 40 Units; Inject 0.4 mLs (40 Units total) into the skin once. - Insulin Glargine (TOUJEO SOLOSTAR) 300 UNIT/ML SOPN; Inject 50 Units into the skin at bedtime.  Dispense: 3 mL; Refill: 3  2. Essential hypertension: Uncontrolled  Add - amLODipine (NORVASC) 10 MG tablet; Take 1 tablet (10 mg total) by mouth daily.  Dispense: 90 tablet; Refill: 3  3. Diabetic gastroparesis ([place]) Start - metoCLOPramide (REGLAN) 5 MG tablet; Take 1 tablet (5 mg total) by mouth 4 (four) times daily -  before meals and at bedtime.  Dispense: 90 tablet; Refill: 3  Cayden was counseled  on the dangers of tobacco use, and was advised to quit. Reviewed strategies to maximize success, including removing cigarettes and smoking materials from environment, stress management and support of family/friends.  Patient have been counseled extensively about nutrition and exercise. Other issues discussed during this visit include: low cholesterol diet, weight control and daily exercise, foot care, annual eye examinations at Ophthalmology, importance of adherence with medications and regular follow-up. We also discussed long term complications of uncontrolled diabetes and hypertension.   Return in about 2 weeks (around 12/25/2015) for CPP [facility] for BP/CBG and DM management.  The patient was given clear instructions to go to ER or return to medical center if symptoms don't improve, worsen or new problems develop. The patient verbalized understanding. The patient was told to call to get lab results if they haven't heard anything in the next week.   This note has  been created with Survey[employer], quantity. Any transcriptional errors are unintentional.    Angelica Chessman, MD, [place], [place], [place], [place] and [place], [place]   12/11/2015, 11:58 AM

## 2015-12-11 NOTE — Patient Instructions (Signed)
Diabetic Nephropathy Diabetic nephropathy is kidney disease that is caused by diabetes. Kidneys are the organs that filter and clean your blood. Kidneys also get rid of body waste products and extra fluid. Diabetes can cause gradual kidney damage over many years. Diabetic nephropathy that continues to get worse can lead to kidney failure. What are the causes? This condition is caused by kidney damage from diabetes. What increases the risk? This condition is more likely to develop in people with diabetes who also have:  High blood pressure.  High blood glucose.  A family history of diabetic nephropathy.  A history of diabetes for many years.  A history of tobacco use.  Certain inherited genes. What are the signs or symptoms? You may already have this condition before symptoms develop. Symptoms may include:  Swelling of your hands, feet, or ankles.  Weakness.  Poor appetite.  Nausea.  Confusion.  Fatigue.  Trouble sleeping. If nephropathy leads to kidney failure, symptoms may include:  Vomiting.  Shortness of breath.  Seizures.  Coma. How is this diagnosed? Usually, your health care provider can diagnose diabetic nephropathy from your symptoms and medical history. Your health care provider will also do a physical exam. It is important to diagnose this condition before symptoms develop so you may also have screening tests to look for any early signs of problems. These tests may include:  Yearly (annual) urine tests.  Urine collection over a 24-hour period to measure kidney function.  Blood tests that measure blood glucose levels and kidney function. Problems other than diabetes can damage kidneys. If screening tests show early kidney damage, but your health care provider suspects that it is caused by a different problem, you may have other tests, such as:  An ultrasound of your kidneys.  A procedure to take a sample of kidney tissue for testing (biopsy). How is  this treated? The goal of treatment is to prevent or slow down damage to your kidneys by managing your diabetes. To do this, it is important to control:  Your blood pressure. Your target blood pressure will be based on your age, the medicines you take, how long you have had diabetes, and any other medical conditions you have. Generally, the goal is to keep your blood pressure below 140/90. Medicines called ACE inhibitors may be used along with a water pill (diuretic) to help you control your blood pressure.  Your A1c (hemoglobin A1c, or HbA1c) level. This is a measurement of your average blood glucose level during the previous 2-3 months. You and your health care provider should work to keep this number under 7.  Your blood lipids. If you have high cholesterol, you may need to take lipid-lowering drugs, such as statins. Other treatments may include:  Medicines, including insulin injections.  Lifestyle changes, such as:  Losing weight.  Quitting smoking (smoking cessation).  A diet, which may include:  Restrictions of salt (sodium), protein, and fluid intake.  Vitamin D supplements. If your disease progresses to end-stage kidney failure, treatment may include:  Dialysis. This is a procedure to filter your blood with a machine.  Kidney transplant. Follow these instructions at home:  Take over-the-counter and prescription medicines only as told by your health care provider.  Follow instructions from your health care provider about eating or drinking restrictions.  Do not use tobacco products, including cigarettes, chewing tobacco, and e-cigarettes. If you need help quitting, ask your health care provider.  Be physically active every day. Ask your health care provider what type  of exercise is best for you.  Eat healthy foods, and eat healthy snacks between meals. Have 3 meals and 3 snacks each day.  Maintain a healthy weight.  Keep all follow-up visits as told by your health  care provider. This is important. Contact a health care provider if:  You have trouble keeping your blood glucose in your goal range.  Your hands, feet, or ankles are swollen.  You feel weak, tired, or dizzy.  You have muscle spasms.  You have nausea or vomiting.  You feel tired all the time. Get help right away if:  You are very sleepy.  You have:  A seizure or convulsion.  Severe, painful muscle spasms.  Shortness of breath.  Chest pain.  You pass out. This information is not intended to replace advice given to you by your health care provider. Make sure you discuss any questions you have with your health care provider. Document Released: 01/17/2007 Document Revised: 06/05/2015 Document Reviewed: 08/19/2014 Elsevier Interactive Patient Education  2017 [place]. Blood Glucose Monitoring, Adult Monitoring your blood sugar (glucose) helps you manage your diabetes. It also helps you and your health care provider determine how well your diabetes management plan is working. Blood glucose monitoring involves checking your blood glucose as often as directed, and keeping a record (log) of your results over time. Why should I monitor my blood glucose? Checking your blood glucose regularly can:  Help you understand how food, exercise, illnesses, and medicines affect your blood glucose.  Let you know what your blood glucose is at any time. You can quickly tell if you are having low blood glucose (hypoglycemia) or high blood glucose (hyperglycemia).  Help you and your health care provider adjust your medicines as needed. When should I check my blood glucose? Follow instructions from your health care provider about how often to check your blood glucose. This may depend on:  The type of diabetes you have.  How well-controlled your diabetes is.  Medicines you are taking. If you have type 1 diabetes:  Check your blood glucose at least 2 times a day.  Also check your blood  glucose:  Before every insulin injection.  Before and after exercise.  Between meals.  2 hours after a meal.  Occasionally between 2:00 a.m. and 3:00 a.m., as directed.  Before potentially dangerous tasks, like driving or using heavy machinery.  At bedtime.  You may need to check your blood glucose more often, up to 6-10 times a day:  If you use an insulin pump.  If you need multiple daily injections (MDI).  If your diabetes is not well-controlled.  If you are ill.  If you have a history of severe hypoglycemia.  If you have a history of not knowing when your blood glucose is getting low (hypoglycemia unawareness). If you have type 2 diabetes:  If you take insulin or other diabetes medicines, check your blood glucose at least 2 times a day.  If you are on intensive insulin therapy, check your blood glucose at least 4 times a day. Occasionally, you may also need to check between 2:00 a.m. and 3:00 a.m., as directed.  Also check your blood glucose:  Before and after exercise.  Before potentially dangerous tasks, like driving or using heavy machinery.  You may need to check your blood glucose more often if:  Your medicine is being adjusted.  Your diabetes is not well-controlled.  You are ill. What is a blood glucose log?  A blood glucose log  is a record of your blood glucose readings. It helps you and your health care provider:  Look for patterns in your blood glucose over time.  Adjust your diabetes management plan as needed.  Every time you check your blood glucose, write down your result and notes about things that may be affecting your blood glucose, such as your diet and exercise for the day.  Most glucose meters store a record of glucose readings in the meter. Some meters allow you to download your records to a computer. How do I check my blood glucose? Follow these steps to get accurate readings of your blood glucose: Supplies needed   Blood glucose  meter.  Test strips for your meter. Each meter has its own strips. You must use the strips that come with your meter.  A needle to prick your finger (lancet). Do not use lancets more than once.  A device that holds the lancet (lancing device).  A journal or log book to write down your results. Procedure  Wash your hands with soap and water.  Prick the side of your finger (not the tip) with the lancet. Use a different finger each time.  Gently rub the finger until a small drop of blood appears.  Follow instructions that come with your meter for inserting the test strip, applying blood to the strip, and using your blood glucose meter.  Write down your result and any notes. Alternative testing sites  Some meters allow you to use areas of your body other than your finger (alternative sites) to test your blood.  If you think you may have hypoglycemia, or if you have hypoglycemia unawareness, do not use alternative sites. Use your finger instead.  Alternative sites may not be as accurate as the fingers, because blood flow is slower in these areas. This means that the result you get may be delayed, and it may be different from the result that you would get from your finger.  The most common alternative sites are:  Forearm.  Thigh.  Palm of the hand. Additional tips  Always keep your supplies with you.  If you have questions or need help, all blood glucose meters have a 24-hour "hotline" number that you can call. You may also contact your health care provider.  After you use a few boxes of test strips, adjust (calibrate) your blood glucose meter by following instructions that came with your meter. This information is not intended to replace advice given to you by your health care provider. Make sure you discuss any questions you have with your health care provider. Document Released: 12/31/2002 Document Revised: 07/18/2015 Document Reviewed: 06/09/2015 Elsevier Interactive Patient  Education  2017 [place].  Diabetes Mellitus and Food It is important for you to manage your blood sugar (glucose) level. Your blood glucose level can be greatly affected by what you eat. Eating healthier foods in the appropriate amounts throughout the day at about the same time each day will help you control your blood glucose level. It can also help slow or prevent worsening of your diabetes mellitus. Healthy eating may even help you improve the level of your blood pressure and reach or maintain a healthy weight. General recommendations for healthful eating and cooking habits include:  Eating meals and snacks regularly. Avoid going long periods of time without eating to lose weight.  Eating a diet that consists mainly of plant-based foods, such as fruits, vegetables, nuts, legumes, and whole grains.  Using low-heat cooking methods, such as baking,  instead of high-heat cooking methods, such as deep frying. Work with your dietitian to make sure you understand how to use the Nutrition Facts information on food labels. How can food affect me? Carbohydrates  Carbohydrates affect your blood glucose level more than any other type of food. Your dietitian will help you determine how many carbohydrates to eat at each meal and teach you how to count carbohydrates. Counting carbohydrates is important to keep your blood glucose at a healthy level, especially if you are using insulin or taking certain medicines for diabetes mellitus. Alcohol  Alcohol can cause sudden decreases in blood glucose (hypoglycemia), especially if you use insulin or take certain medicines for diabetes mellitus. Hypoglycemia can be a life-threatening condition. Symptoms of hypoglycemia (sleepiness, dizziness, and disorientation) are similar to symptoms of having too much alcohol. If your health care provider has given you approval to drink alcohol, do so in moderation and use the following guidelines:  Women should not have more  than one drink per day, and men should not have more than two drinks per day. One drink is equal to:  12 oz of beer.  5 oz of wine.  1 oz of hard liquor.  Do not drink on an empty stomach.  Keep yourself hydrated. Have water, diet soda, or unsweetened iced tea.  Regular soda, juice, and other mixers might contain a lot of carbohydrates and should be counted. What foods are not recommended? As you make food choices, it is important to remember that all foods are not the same. Some foods have fewer nutrients per serving than other foods, even though they might have the same number of calories or carbohydrates. It is difficult to get your body what it needs when you eat foods with fewer nutrients. Examples of foods that you should avoid that are high in calories and carbohydrates but low in nutrients include:  Trans fats (most processed foods list trans fats on the Nutrition Facts label).  Regular soda.  Juice.  Candy.  Sweets, such as cake, pie, doughnuts, and cookies.  Fried foods. What foods can I eat? Eat nutrient-rich foods, which will nourish your body and keep you healthy. The food you should eat also will depend on several factors, including:  The calories you need.  The medicines you take.  Your weight.  Your blood glucose level.  Your blood pressure level.  Your cholesterol level. You should eat a variety of foods, including:  Protein.  Lean cuts of meat.  Proteins low in saturated fats, such as fish, egg whites, and beans. Avoid processed meats.  Fruits and vegetables.  Fruits and vegetables that may help control blood glucose levels, such as apples, mangoes, and yams.  Dairy products.  Choose fat-free or low-fat dairy products, such as milk, yogurt, and cheese.  Grains, bread, pasta, and rice.  Choose whole grain products, such as multigrain bread, whole oats, and brown rice. These foods may help control blood pressure.  Fats.  Foods containing  healthful fats, such as nuts, avocado, olive oil, canola oil, and fish. Does everyone with diabetes mellitus have the same meal plan? Because every person with diabetes mellitus is different, there is not one meal plan that works for everyone. It is very important that you meet with a dietitian who will help you create a meal plan that is just right for you. This information is not intended to replace advice given to you by your health care provider. Make sure you discuss any questions you have  with your health care provider. Document Released: 09/24/2004 Document Revised: 06/05/2015 Document Reviewed: 11/24/2012 Elsevier Interactive Patient Education  2017 [employer].

## 2015-12-11 NOTE — Progress Notes (Signed)
Patient is here for DM/HTN  Patient has taken medication today. Patient has not eaten today.  Patient complains of bilateral foot and leg pain. Pain is described as constant neuropathy.  Patient declined flu vaccine today.  Patient tolerated 40 units of insulin in the left arm well today.

## 2015-12-12 LAB — MICROALBUMIN / CREATININE URINE RATIO
CREATININE, URINE: 51 mg/dL (ref 20–320)
Microalb Creat Ratio: 2053 mcg/mg creat — ABNORMAL HIGH (ref <30)
Microalb, Ur: 104.7 mg/dL

## 2015-12-17 MED FILL — AMLODIPINE BESYLATE 10 MG T: 10 | 30 days supply | Qty: 30 | Fill #0

## 2015-12-17 MED FILL — METOCLOPRAMIDE 5 MG TABLET: 5 | 23 days supply | Qty: 90 | Fill #0

## 2015-12-24 ENCOUNTER — Telehealth: Payer: Self-pay | Admitting: Internal Medicine

## 2015-12-24 NOTE — Telephone Encounter (Signed)
Patient called the office to speak with PCP in regards to getting a referral to specialist. Pt went to eye doctor and informed her that there is blood behind her eyes and she has cataract. Pt also needs to see a kidney specialist because her kidney levels are low. Please follow up.  Thank you.

## 2015-12-26 NOTE — Telephone Encounter (Signed)
Are these referral request appropriate.

## 2015-12-29 ENCOUNTER — Other Ambulatory Visit: Payer: Self-pay | Admitting: Internal Medicine

## 2015-12-29 DIAGNOSIS — E1121 Type 2 diabetes mellitus with diabetic nephropathy: Secondary | ICD-10-CM

## 2015-12-29 NOTE — Telephone Encounter (Signed)
Spoke with pt and informed her that the referral was placed by PCP and that Alinda Sierras will be contacting her soon

## 2015-12-29 NOTE — Telephone Encounter (Signed)
Patient referred to Nephrologist

## 2015-12-30 ENCOUNTER — Ambulatory Visit: Payer: Self-pay | Attending: Internal Medicine | Admitting: Pharmacist

## 2015-12-30 DIAGNOSIS — Z794 Long term (current) use of insulin: Secondary | ICD-10-CM | POA: Insufficient documentation

## 2015-12-30 DIAGNOSIS — E1165 Type 2 diabetes mellitus with hyperglycemia: Secondary | ICD-10-CM

## 2015-12-30 DIAGNOSIS — E1142 Type 2 diabetes mellitus with diabetic polyneuropathy: Secondary | ICD-10-CM

## 2015-12-30 DIAGNOSIS — I10 Essential (primary) hypertension: Secondary | ICD-10-CM | POA: Insufficient documentation

## 2015-12-30 DIAGNOSIS — Z79899 Other long term (current) drug therapy: Secondary | ICD-10-CM | POA: Insufficient documentation

## 2015-12-30 DIAGNOSIS — E119 Type 2 diabetes mellitus without complications: Secondary | ICD-10-CM | POA: Insufficient documentation

## 2015-12-30 DIAGNOSIS — IMO0002 Reserved for concepts with insufficient information to code with codable children: Secondary | ICD-10-CM

## 2015-12-30 MED ORDER — INSULIN GLARGINE 300 UNIT/ML ~~LOC~~ SOPN: 60.0000 [IU] | PEN_INJECTOR | Freq: Every day | SUBCUTANEOUS | 3 refills | Status: DC

## 2015-12-30 NOTE — Patient Instructions (Signed)
Increase Toujeo to 60 units daily - for each day that your blood sugar is higher than 200 - increase the Toujeo dose by 1 unit. So if your blood sugar tomorrow when you wake up is 200 or higher, take 61. The next day if it is 200 or higher, take 62. Keep doing this until you blood sugar is less than 200 when you wake up.  Come back and see me in 2 weeks - we are going to work hard to get this blood sugar down so you can have surgery!

## 2015-12-30 NOTE — Progress Notes (Signed)
    S:    Patient arrives in good spirits.  Presents for diabetes evaluation, education, and management at the request of Elio Forget and Dr. Doreene Burke. Patient was referred on 11/25/15.  Last visit with Dr. Doreene Burke was 12/11/15.  Patient reports adherence with medications.  Current diabetes medications include: Toujeo 50 units daily, and regular insulin 25-30 units TID.  Current hypertension medications include: losartan 100 mg daily and metoprolol 25 mg BID  Patient denies hypoglycemic events.  Patient reported dietary habits: Eats 3 meals/day. Very focused on eating vegetables and meats and doesn't understand why her blood glucose continues to be so elevated.  Patient reported exercise habits: she cannot exercise due to neuropathy   Patient reports nocturia.  Patient reports neuropathy. Patient reports visual changes. Patient reports self foot exams.   Patient is supposed to get eye surgery but it has been postponed until her blood glucose is controlled.   O:  Lab Results  Component Value Date   HGBA1C >15 11/25/2015   There were no vitals filed for this visit.  Home fasting CBG: 200s-400s 2 hour post-prandial/random CBG: 300s-500s  A/P: Diabetes longstanding currently UNcontrolled based on A1c >15 and home CBGs. Patient denies hypoglycemic events and is able to verbalize appropriate hypoglycemia management plan. Patient reports adherence with medication.  Increased Toujeo to 60 units and patient will self-titrate by 1 unit each day until her fasting blood glucose is <200. Extensively reviewed this with her and she was able to repeat instructions. Leave regular insulin as is for now. Will be very aggressive with insulin titration so that she can get the eye surgery. Patient agrees with this plan.   Next A1C anticipated February 2018.    Written patient instructions provided.  Total time in face to face counseling 30 minutes.   Follow up in Pharmacist Clinic Visit in 2  weeks.

## 2016-01-09 ENCOUNTER — Other Ambulatory Visit: Payer: Self-pay | Admitting: Nephrology

## 2016-01-09 DIAGNOSIS — N183 Chronic kidney disease, stage 3 unspecified: Secondary | ICD-10-CM

## 2016-01-13 ENCOUNTER — Ambulatory Visit: Payer: Self-pay | Attending: Internal Medicine | Admitting: Pharmacist

## 2016-01-13 DIAGNOSIS — E1142 Type 2 diabetes mellitus with diabetic polyneuropathy: Secondary | ICD-10-CM

## 2016-01-13 DIAGNOSIS — K3184 Gastroparesis: Secondary | ICD-10-CM | POA: Insufficient documentation

## 2016-01-13 DIAGNOSIS — E1143 Type 2 diabetes mellitus with diabetic autonomic (poly)neuropathy: Secondary | ICD-10-CM | POA: Insufficient documentation

## 2016-01-13 DIAGNOSIS — IMO0002 Reserved for concepts with insufficient information to code with codable children: Secondary | ICD-10-CM

## 2016-01-13 DIAGNOSIS — E1165 Type 2 diabetes mellitus with hyperglycemia: Secondary | ICD-10-CM

## 2016-01-13 DIAGNOSIS — Z794 Long term (current) use of insulin: Secondary | ICD-10-CM | POA: Insufficient documentation

## 2016-01-13 LAB — POCT URINALYSIS DIPSTICK
Bilirubin, UA: NEGATIVE
Glucose, UA: >=1000
Ketones, UA: NEGATIVE
Leukocytes, UA: NEGATIVE
NITRITE UA: NEGATIVE
PH UA: 5.5
Protein, UA: >=300
Spec Grav, UA: 1.025
UROBILINOGEN UA: 0.2

## 2016-01-13 MED ORDER — INSULIN GLARGINE 300 UNIT/ML ~~LOC~~ SOPN: 70.0000 [IU] | PEN_INJECTOR | Freq: Every day | SUBCUTANEOUS | 3 refills | Status: DC

## 2016-01-13 MED ORDER — INSULIN REGULAR HUMAN 100 UNIT/ML IJ SOLN: 30.0000 [IU] | Freq: Three times a day (TID) | INTRAMUSCULAR | 3 refills | Status: DC

## 2016-01-13 MED FILL — !TOUJEO SOLOSTAR 300 UNITS/: 300/ML | 19 days supply | Qty: 3 | Fill #0

## 2016-01-13 NOTE — Patient Instructions (Signed)
Thanks for coming to see me!  Increase Toujeo to 70 units daily  Increase the regular insulin to 30 units TID ONLY when you eat a meal.   Hypoglycemia  Hypoglycemia is when the sugar (glucose) level in the blood is too low. Symptoms of low blood sugar may include:  Feeling:  Hungry.  Worried or nervous (anxious).  Sweaty and clammy.  Confused.  Dizzy.  Sleepy.  Sick to your stomach (nauseous).  Having:  A fast heartbeat.  A headache.  A change in your vision.  Jerky movements that you cannot control (seizure).  Nightmares.  Tingling or no feeling (numbness) around the mouth, lips, or tongue.  Having trouble with:  Talking.  Paying attention (concentrating).  Moving (coordination).  Sleeping.  Shaking.  Passing out (fainting).  Getting upset easily (irritability). Low blood sugar can happen to people who have diabetes and people who do not have diabetes. Low blood sugar can happen quickly, and it can be an emergency. Treating Low Blood Sugar  Low blood sugar is often treated by eating or drinking something sugary right away. If you can think clearly and swallow safely, follow the 15:15 rule:  Take 15 grams of a fast-acting carb (carbohydrate). Some fast-acting carbs are:  1 tube of glucose gel.  3 sugar tablets (glucose pills).  6-8 pieces of hard candy.  4 oz (120 mL) of fruit juice.  4 oz (120 mL) of regular (not diet) soda.  Check your blood sugar 15 minutes after you take the carb.  If your blood sugar is still at or below 70 mg/dL (3.9 mmol/L), take 15 grams of a carb again.  If your blood sugar does not go above 70 mg/dL (3.9 mmol/L) after 3 tries, get help right away.  After your blood sugar goes back to normal, eat a meal or a snack within 1 hour. Treating Very Low Blood Sugar  If your blood sugar is at or below 54 mg/dL (3 mmol/L), you have very low blood sugar (severe hypoglycemia). This is an emergency. Do not wait to see if the  symptoms will go away. Get medical help right away. Call your local emergency services (911 in the [place] Do not drive yourself to the hospital. If you have very low blood sugar and you cannot eat or drink, you may need a glucagon shot (injection). A family member or friend should learn how to check your blood sugar and how to give you a glucagon shot. Ask your doctor if you need to have a glucagon shot kit at home. Follow these instructions at home: General instructions  Avoid any diets that cause you to not eat enough food. Talk with your doctor before you start any new diet.  Take over-the-counter and prescription medicines only as told by your doctor.  Limit alcohol to no more than 1 drink per day for nonpregnant women and 2 drinks per day for men. One drink equals 12 oz of beer, 5 oz of wine, or 1 oz of hard liquor.  Keep all follow-up visits as told by your doctor. This is important. If You Have Diabetes:   Make sure you know the symptoms of low blood sugar.  Always keep a source of sugar with you, such as:  Sugar.  Sugar tablets.  Glucose gel.  Fruit juice.  Regular soda (not diet soda).  Milk.  Hard candy.  Honey.  Take your medicines as told.  Follow your exercise and meal plan.  Eat on time. Do not  skip meals.  Follow your sick day plan when you cannot eat or drink normally. Make this plan ahead of time with your doctor.  Check your blood sugar as often as told by your doctor. Always check before and after exercise.  Share your diabetes care plan with:  Your work or school.  People you live with.  Check your pee (urine) for ketones:  When you are sick.  As told by your doctor.  Carry a card or wear jewelry that says you have diabetes. If You Have Low Blood Sugar From Other Causes:   Check your blood sugar as often as told by your doctor.  Follow instructions from your doctor about what you cannot eat or drink. Contact a doctor if:  You have  trouble keeping your blood sugar in your target range.  You have low blood sugar often. Get help right away if:  You still have symptoms after you eat or drink something sugary.  Your blood sugar is at or below 54 mg/dL (3 mmol/L).  You have jerky movements that you cannot control.  You pass out. These symptoms may be an emergency. Do not wait to see if the symptoms will go away. Get medical help right away. Call your local emergency services (911 in the [place] Do not drive yourself to the hospital.  This information is not intended to replace advice given to you by your health care provider. Make sure you discuss any questions you have with your health care provider. Document Released: 03/24/2009 Document Revised: 06/05/2015 Document Reviewed: 01/31/2015 Elsevier Interactive Patient Education  2017 [employer].

## 2016-01-13 NOTE — Progress Notes (Signed)
    S:    Patient arrives in good spirits.  Presents for diabetes evaluation, education, and management at the request of Elio Forget and Dr. Doreene Burke. Patient was referred on 11/25/15.  Last visit with Dr. Doreene Burke was 12/11/15.  Patient reports adherence with medications.  Current diabetes medications include: Toujeo 60 units daily, and regular insulin 25-30 units TID.  Patient denies hypoglycemic events.  Patient reported dietary habits: has had a harder time eating recently due to nausea and vomiting from gastroparesis. She vomits every day and feels like it has been worse the last two days. She was under consideration for a gastric stimulator at [facility] but is no longer following with them.   Patient reported exercise habits: she cannot exercise due to neuropathy   Patient reports nocturia.  Patient reports neuropathy. Patient reports visual changes. Patient reports self foot exams.   Patient is supposed to get eye surgery but it has been postponed until her blood glucose is controlled. She reports that she was still referred and wants to follow up on the referral.    O:  Lab Results  Component Value Date   HGBA1C >15 11/25/2015   There were no vitals filed for this visit.  Home fasting CBG: 200s-400s 2 hour post-prandial/random CBG: 300s-500s  POCT glucose = 351  A/P: Diabetes longstanding currently UNcontrolled based on A1c >15 and home CBGs. Patient denies hypoglycemic events and is able to verbalize appropriate hypoglycemia management plan. Patient reports adherence with medication. I do question whether she misses it at times but she denies missed doses.  Urinalysis negative for ketones. Patient endorses nausea during visit and would like to go home to take her insulin rather than receive any in clinic.   Increased Toujeo to 70 units and increase insulin regular to consistent 30 units before meals. Consider splitting the Toujeo dose at the next visit if  increased dose is needed - may have resistent to insulin that is preventing it from lasting 24 hours. Still has diabetic gastroparesis with daily vomiting. Will continue to be aggressive with insulin titration to hopefully improve s/sx of gastroparesis as well as vision.   She was seeing endocrinology, I recommended that she follow up with them since she has been established with them. Needs new referral for eye examination as she now has the [employer].   Next A1C anticipated February 2018.    Written patient instructions provided.  Total time in face to face counseling 30 minutes.   Follow up with Dr. Doreene Burke.

## 2016-01-14 ENCOUNTER — Ambulatory Visit
Admission: RE | Admit: 2016-01-14 | Discharge: 2016-01-14 | Disposition: A | Discharge status: Home / Self Care | Payer: No Typology Code available for payment source | Source: Ambulatory Visit | Attending: Nephrology | Admitting: Nephrology

## 2016-01-14 DIAGNOSIS — N183 Chronic kidney disease, stage 3 unspecified: Secondary | ICD-10-CM

## 2016-01-21 ENCOUNTER — Encounter: Payer: Self-pay | Admitting: Internal Medicine

## 2016-01-21 ENCOUNTER — Ambulatory Visit: Payer: Self-pay

## 2016-01-21 ENCOUNTER — Ambulatory Visit: Payer: Self-pay | Attending: Internal Medicine | Admitting: Internal Medicine

## 2016-01-21 ENCOUNTER — Other Ambulatory Visit: Payer: Self-pay | Admitting: Internal Medicine

## 2016-01-21 VITALS — BP 145/81 | HR 112 | Temp 98.7°F | Resp 18 | Ht 65.0 in | Wt 174.2 lb

## 2016-01-21 DIAGNOSIS — Z79899 Other long term (current) drug therapy: Secondary | ICD-10-CM | POA: Insufficient documentation

## 2016-01-21 DIAGNOSIS — N183 Chronic kidney disease, stage 3 (moderate): Secondary | ICD-10-CM | POA: Insufficient documentation

## 2016-01-21 DIAGNOSIS — Z86718 Personal history of other venous thrombosis and embolism: Secondary | ICD-10-CM | POA: Insufficient documentation

## 2016-01-21 DIAGNOSIS — E1143 Type 2 diabetes mellitus with diabetic autonomic (poly)neuropathy: Secondary | ICD-10-CM

## 2016-01-21 DIAGNOSIS — Z888 Allergy status to other drugs, medicaments and biological substances status: Secondary | ICD-10-CM | POA: Insufficient documentation

## 2016-01-21 DIAGNOSIS — I129 Hypertensive chronic kidney disease with stage 1 through stage 4 chronic kidney disease, or unspecified chronic kidney disease: Secondary | ICD-10-CM | POA: Insufficient documentation

## 2016-01-21 DIAGNOSIS — J45909 Unspecified asthma, uncomplicated: Secondary | ICD-10-CM | POA: Insufficient documentation

## 2016-01-21 DIAGNOSIS — K3184 Gastroparesis: Secondary | ICD-10-CM

## 2016-01-21 DIAGNOSIS — K219 Gastro-esophageal reflux disease without esophagitis: Secondary | ICD-10-CM | POA: Insufficient documentation

## 2016-01-21 DIAGNOSIS — IMO0002 Reserved for concepts with insufficient information to code with codable children: Secondary | ICD-10-CM

## 2016-01-21 DIAGNOSIS — E1122 Type 2 diabetes mellitus with diabetic chronic kidney disease: Secondary | ICD-10-CM | POA: Insufficient documentation

## 2016-01-21 DIAGNOSIS — E1165 Type 2 diabetes mellitus with hyperglycemia: Secondary | ICD-10-CM

## 2016-01-21 DIAGNOSIS — I10 Essential (primary) hypertension: Secondary | ICD-10-CM

## 2016-01-21 DIAGNOSIS — Z794 Long term (current) use of insulin: Secondary | ICD-10-CM | POA: Insufficient documentation

## 2016-01-21 DIAGNOSIS — E1142 Type 2 diabetes mellitus with diabetic polyneuropathy: Secondary | ICD-10-CM

## 2016-01-21 DIAGNOSIS — Z880 Allergy status to penicillin: Secondary | ICD-10-CM | POA: Insufficient documentation

## 2016-01-21 LAB — GLUCOSE, POCT (MANUAL RESULT ENTRY): POC GLUCOSE: 506 mg/dL — AB (ref 70–99)

## 2016-01-21 MED ORDER — METOPROLOL TARTRATE 25 MG PO TABS: 25.0000 mg | ORAL_TABLET | Freq: Two times a day (BID) | ORAL | 3 refills | Status: DC

## 2016-01-21 MED ORDER — GABAPENTIN 100 MG PO CAPS: 600.0000 mg | ORAL_CAPSULE | Freq: Two times a day (BID) | ORAL | 3 refills | Status: DC

## 2016-01-21 MED ORDER — AMLODIPINE BESYLATE 10 MG PO TABS: 10.0000 mg | ORAL_TABLET | Freq: Every day | ORAL | 3 refills | Status: DC

## 2016-01-21 MED ORDER — METOCLOPRAMIDE HCL 5 MG PO TABS: 5.0000 mg | ORAL_TABLET | Freq: Three times a day (TID) | ORAL | 3 refills | Status: DC

## 2016-01-21 MED ORDER — INSULIN GLARGINE 300 UNIT/ML ~~LOC~~ SOPN: 70.0000 [IU] | PEN_INJECTOR | Freq: Every day | SUBCUTANEOUS | 3 refills | Status: DC

## 2016-01-21 MED ORDER — LOSARTAN POTASSIUM 100 MG PO TABS: 100.0000 mg | ORAL_TABLET | Freq: Every day | ORAL | 3 refills | Status: DC

## 2016-01-21 NOTE — Patient Instructions (Signed)

## 2016-01-21 NOTE — Progress Notes (Signed)
Susan Horton, is a 46 y.o. female  TKW:[id number]  JME:[id number]  DOB - Jul 07, 1970  Chief Complaint  Patient presents with  . Nausea  . Emesis      Subjective:   Susan Horton is a 46 y.o. female with history of uncontrolled DM, HTN, nicotine use disorder, Asthma, and CKD stage 3  here today for a follow up visit. Despite adherence with medications and current insulin regimen, patient continues to have uncontrolled blood sugar, ranging between 200 and 500. She continues to have bilateral foot pain and numbness from neuropathy. She has blurred vision from diabetic complications. She is frustrated that she has all these complications and unable to work. She continues to have daily vomiting from diabetic gastroparesis. She claims she was doing fine on insulin pump but since losing her insurance and unable to afford insulin pump, her medical conditions had gone worse. She needs refill of her medications today. Patient has No headache, No chest pain, No Cough - SOB.  No problems updated.  ALLERGIES: Allergies  Allergen Reactions  . Penicillins Anaphylaxis, Itching, Swelling and Rash    Swelling of throat and whole mouth Has patient had a PCN reaction causing immediate rash, facial/tongue/throat swelling, SOB or lightheadedness with hypotension: YES Has patient had a PCN reaction causing severe rash involving mucus membranes or skin necrosis:NO Has patient had a PCN reaction that required hospitalization NO Has patient had a PCN reaction occurring within the last 10 years:NO If all of the above answers are "NO", then may proceed with Cephalosporin use.  [name] Lisinopril Cough  . Penicillin G Swelling and Rash    PAST MEDICAL HISTORY: Past Medical History:  Diagnosis Date  . Asthma    prn inhaler  . Diabetes mellitus    IDDM  . DVT (deep venous thrombosis) ([place])    20+ yrs ago (during pregnancy), 2015  . Essential hypertension, benign 12/11/2015  . GERD (gastroesophageal reflux disease)    . Headache(784.0)    migraines  . Hidradenitis     MEDICATIONS AT HOME: Prior to Admission medications   Medication Sig Start Date End Date Taking? Authorizing Provider  albuterol (PROVENTIL HFA;VENTOLIN HFA) 108 (90 BASE) MCG/ACT inhaler Inhale 1 puff into the lungs every 6 (six) hours as needed for wheezing or shortness of breath.   Yes Historical Provider, MD  amLODipine (NORVASC) 10 MG tablet Take 1 tablet (10 mg total) by mouth daily. 01/21/16  Yes [name], MD  Ferrous Sulfate (IRON) 325 (65 Fe) MG TABS Take 325 mg by mouth daily. 11/25/15  Yes Tiffany Daneil Dan, PA-C  Fluticasone-Salmeterol (ADVAIR) 250-50 MCG/DOSE AEPB Inhale 1 puff into the lungs 2 (two) times daily as needed (FOR SHORTNESS OF BREATH).    Yes Historical Provider, MD  gabapentin (NEURONTIN) 100 MG capsule Take 6 capsules (600 mg total) by mouth 2 (two) times daily. 01/21/16  Yes [name], MD  Insulin Glargine (TOUJEO SOLOSTAR) 300 UNIT/ML SOPN Inject 70 Units into the skin at bedtime. 01/21/16  Yes [name], MD  insulin regular (NOVOLIN R,HUMULIN R) 250 units/2.35mL (100 units/mL) injection Inject 0.3 mLs (30 Units total) into the skin 3 (three) times daily before meals. Sliding scale 01/13/16  Yes Treina Arscott Essie Christine, MD  Insulin Syringe 27G X 1/2" 0.5 ML MISC 1 Syringe by Does not apply route 5 (five) times daily. 11/20/15  Yes Orlie Dakin, MD  losartan (COZAAR) 100 MG tablet Take 1 tablet (100 mg total) by mouth daily. 01/21/16  Yes  [name], MD  lovastatin (MEVACOR) 20 MG tablet Take 20 mg by mouth daily.   Yes Historical Provider, MD  metoCLOPramide (REGLAN) 5 MG tablet Take 1 tablet (5 mg total) by mouth 4 (four) times daily -  before meals and at bedtime. 01/21/16  Yes [name], MD  metoprolol tartrate (LOPRESSOR) 25 MG tablet Take 1 tablet (25 mg total) by mouth 2 (two) times daily. 01/21/16  Yes [name], MD  montelukast (SINGULAIR) 10 MG tablet Take 10 mg  by mouth daily as needed (allergies).    Yes Historical Provider, MD  omeprazole (PRILOSEC) 40 MG capsule Take 40 mg by mouth at bedtime.    Yes Historical Provider, MD    Objective:   Vitals:   01/21/16 1641  BP: (!) 145/81  Pulse: (!) 112  Resp: 18  Temp: 98.7 F (37.1 C)  TempSrc: Oral  SpO2: 100%  Weight: 174 lb 3.2 oz (79 kg)  Height: 5\' 5"  (1.651 m)   Exam General appearance : Awake, alert, not in any distress. Speech Clear. Not toxic looking HEENT: Atraumatic and Normocephalic, pupils equally reactive to light and accomodation Neck: Supple, no JVD. No cervical lymphadenopathy.  Chest: Good air entry bilaterally, no added sounds  CVS: S1 S2 regular, no murmurs.  Abdomen: Bowel sounds present, Non tender and not distended with no gaurding, rigidity or rebound. Extremities: B/L Lower Ext shows no edema, both legs are warm to touch Neurology: Awake alert, and oriented X 3, CN II-XII intact, Non focal Skin: No Rash  Data Review Lab Results  Component Value Date   HGBA1C >15 11/25/2015   HGBA1C 12.4 09/03/2014   HGBA1C 11.6 (H) 08/22/2013    Assessment & Plan   1. Uncontrolled type 2 diabetes mellitus with peripheral neuropathy (HCC)  - Glucose (CBG) - Insulin Glargine (TOUJEO SOLOSTAR) 300 UNIT/ML SOPN; Inject 70 Units into the skin at bedtime.  Dispense: 3 mL; Refill: 3 - Ambulatory referral to Ophthalmology for diabetic eye exam - Ambulatory referral to Podiatry for diabetic foot exam  2. Essential hypertension, benign Refill - metoprolol tartrate (LOPRESSOR) 25 MG tablet; Take 1 tablet (25 mg total) by mouth 2 (two) times daily.  Dispense: 180 tablet; Refill: 3 - losartan (COZAAR) 100 MG tablet; Take 1 tablet (100 mg total) by mouth daily.  Dispense: 90 tablet; Refill: 3 - amLODipine (NORVASC) 10 MG tablet; Take 1 tablet (10 mg total) by mouth daily.  Dispense: 90 tablet; Refill: 3  3. Diabetic gastroparesis (HCC)  - metoCLOPramide (REGLAN) 5 MG tablet; Take  1 tablet (5 mg total) by mouth 4 (four) times daily -  before meals and at bedtime.  Dispense: 90 tablet; Refill: 3 - gabapentin (NEURONTIN) 100 MG capsule; Take 6 capsules (600 mg total) by mouth 2 (two) times daily.  Dispense: 360 capsule; Refill: 3  Patient have been counseled extensively about nutrition and exercise. Other issues discussed during this visit include: low cholesterol diet, weight control and daily exercise, foot care, annual eye examinations at Ophthalmology, importance of adherence with medications and regular follow-up. We also discussed long term complications of uncontrolled diabetes and hypertension.   Return in about 2 weeks (around 02/04/2016) for [facility] for BP/CBG and DM management.  The patient was given clear instructions to go to ER or return to medical center if symptoms don't improve, worsen or new problems develop. The patient verbalized understanding. The patient was told to call to get lab results if they haven't heard anything  in the next week.   This note has been created with Surveyor, quantity. Any transcriptional errors are unintentional.    Angelica Chessman, MD, [place], [place], [place], [place] and [place], [place]   01/21/2016, 4:50 PM

## 2016-01-21 NOTE — Progress Notes (Signed)
Patient is here for N/V  Patient has taken medication today.  Patient has not eaten today.  Patient complains of leg pain being like pins and needles.

## 2016-01-22 MED FILL — AMLODIPINE BESYLATE 10 MG T: 10 | 30 days supply | Qty: 30 | Fill #0

## 2016-01-22 MED FILL — !TOUJEO SOLOSTAR 300 UNITS/: 300/ML | 30 days supply | Qty: 3 | Fill #0

## 2016-01-22 MED FILL — METOCLOPRAMIDE 5 MG TABLET: 5 | 15 days supply | Qty: 90 | Fill #0

## 2016-01-22 MED FILL — LOSARTAN POTASSIUM 100 MG T: 100 | 30 days supply | Qty: 30 | Fill #0

## 2016-01-22 MED FILL — GABAPENTIN 100 MG CAPSULE: 100 | 30 days supply | Qty: 360 | Fill #0

## 2016-01-22 MED FILL — METOPROLOL TARTRATE 25 MG T: 25 | 30 days supply | Qty: 60 | Fill #0

## 2016-02-04 ENCOUNTER — Encounter (HOSPITAL_COMMUNITY): Payer: Self-pay | Admitting: Emergency Medicine

## 2016-02-04 ENCOUNTER — Ambulatory Visit: Payer: Self-pay | Attending: Internal Medicine | Admitting: Pharmacist

## 2016-02-04 DIAGNOSIS — E1121 Type 2 diabetes mellitus with diabetic nephropathy: Secondary | ICD-10-CM | POA: Insufficient documentation

## 2016-02-04 DIAGNOSIS — K3184 Gastroparesis: Secondary | ICD-10-CM

## 2016-02-04 DIAGNOSIS — E119 Type 2 diabetes mellitus without complications: Secondary | ICD-10-CM | POA: Insufficient documentation

## 2016-02-04 DIAGNOSIS — E1142 Type 2 diabetes mellitus with diabetic polyneuropathy: Secondary | ICD-10-CM

## 2016-02-04 DIAGNOSIS — J45909 Unspecified asthma, uncomplicated: Secondary | ICD-10-CM | POA: Insufficient documentation

## 2016-02-04 DIAGNOSIS — F17210 Nicotine dependence, cigarettes, uncomplicated: Secondary | ICD-10-CM | POA: Insufficient documentation

## 2016-02-04 DIAGNOSIS — Z794 Long term (current) use of insulin: Secondary | ICD-10-CM | POA: Insufficient documentation

## 2016-02-04 DIAGNOSIS — Z7984 Long term (current) use of oral hypoglycemic drugs: Secondary | ICD-10-CM | POA: Insufficient documentation

## 2016-02-04 DIAGNOSIS — IMO0002 Reserved for concepts with insufficient information to code with codable children: Secondary | ICD-10-CM

## 2016-02-04 DIAGNOSIS — E1165 Type 2 diabetes mellitus with hyperglycemia: Secondary | ICD-10-CM

## 2016-02-04 DIAGNOSIS — E1143 Type 2 diabetes mellitus with diabetic autonomic (poly)neuropathy: Secondary | ICD-10-CM

## 2016-02-04 DIAGNOSIS — Z79899 Other long term (current) drug therapy: Secondary | ICD-10-CM | POA: Insufficient documentation

## 2016-02-04 DIAGNOSIS — E1140 Type 2 diabetes mellitus with diabetic neuropathy, unspecified: Secondary | ICD-10-CM | POA: Insufficient documentation

## 2016-02-04 DIAGNOSIS — I10 Essential (primary) hypertension: Secondary | ICD-10-CM | POA: Insufficient documentation

## 2016-02-04 LAB — URINALYSIS, ROUTINE W REFLEX MICROSCOPIC
Bilirubin Urine: NEGATIVE
Glucose, UA: >=500 mg/dL — AB
Ketones, ur: NEGATIVE mg/dL
Leukocytes, UA: NEGATIVE
Nitrite: NEGATIVE
Protein, ur: 100 mg/dL — AB
SPECIFIC GRAVITY, URINE: 1.013 (ref 1.005–1.030)
pH: 6 (ref 5.0–8.0)

## 2016-02-04 LAB — BASIC METABOLIC PANEL
ANION GAP: 9 (ref 5–15)
BUN: 32 mg/dL — ABNORMAL HIGH (ref 6–20)
CALCIUM: 8.9 mg/dL (ref 8.9–10.3)
CO2: 22 mmol/L (ref 22–32)
Chloride: 100 mmol/L — ABNORMAL LOW (ref 101–111)
Creatinine, Ser: 2.07 mg/dL — ABNORMAL HIGH (ref 0.44–1.00)
GFR calc Af Amer: 32 mL/min — ABNORMAL LOW (ref >60)
GFR calc non Af Amer: 28 mL/min — ABNORMAL LOW (ref >60)
GLUCOSE: 378 mg/dL — AB (ref 65–99)
Potassium: 4.5 mmol/L (ref 3.5–5.1)
Sodium: 131 mmol/L — ABNORMAL LOW (ref 135–145)

## 2016-02-04 LAB — CBC
HCT: 28.7 % — ABNORMAL LOW (ref 36.0–46.0)
HEMOGLOBIN: 9.2 g/dL — AB (ref 12.0–15.0)
MCH: 24.8 pg — AB (ref 26.0–34.0)
MCHC: 32.1 g/dL (ref 30.0–36.0)
MCV: 77.4 fL — AB (ref 78.0–100.0)
Platelets: 471 10*3/uL — ABNORMAL HIGH (ref 150–400)
RBC: 3.71 MIL/uL — ABNORMAL LOW (ref 3.87–5.11)
RDW: 12.7 % (ref 11.5–15.5)
WBC: 10.1 10*3/uL (ref 4.0–10.5)

## 2016-02-04 LAB — CBG MONITORING, ED: GLUCOSE-CAPILLARY: 359 mg/dL — AB (ref 65–99)

## 2016-02-04 MED ORDER — GABAPENTIN 300 MG PO CAPS: 600.0000 mg | ORAL_CAPSULE | Freq: Two times a day (BID) | ORAL | 2 refills | Status: DC

## 2016-02-04 MED ORDER — FLUCONAZOLE 150 MG PO TABS: 150.0000 mg | ORAL_TABLET | Freq: Once | ORAL | 0 refills | Status: AC

## 2016-02-04 MED ORDER — INSULIN GLARGINE 300 UNIT/ML ~~LOC~~ SOPN: 35.0000 [IU] | PEN_INJECTOR | Freq: Two times a day (BID) | SUBCUTANEOUS | 3 refills | Status: DC

## 2016-02-04 MED FILL — !TOUJEO SOLOSTAR 300 UNITS/: 300/ML | 30 days supply | Qty: 3 | Fill #0

## 2016-02-04 MED FILL — FLUCONAZOLE 150 MG TABLET: 150 | 1 days supply | Qty: 1 | Fill #0

## 2016-02-04 NOTE — ED Triage Notes (Signed)
Patient with states that she is not able to get control of her blood sugar today.  She states that at home it was 411 at [place] today.  Patient also having increased pain in her feet.  Patient has Type II diabetes.  She regularly vomits everyday due to gastroparesis.

## 2016-02-04 NOTE — Patient Instructions (Signed)
Thanks for coming to see Susan Horton!  Increase Toujeo to 35 units twice a day  Take 30 units of regular insulin before meals.  I switched the gabapentin to 300 mg capsules so you don't have to take as much  I ordered fluconazole x 1 dose.   Come back in 1-2 weeks.  [facility] card should work for endocrinology - try to call and make appointment

## 2016-02-04 NOTE — Progress Notes (Signed)
    S:    Patient arrives in good spirits.  Presents for diabetes evaluation, education, and management at the request of Elio Forget and Dr. Doreene Burke. Patient was referred on 11/25/15.  Last visit with Dr. Doreene Burke was 12/11/15.  Patient reports adherence with medications.  Current diabetes medications include: Toujeo 25 units BID, and regular insulin 30 units TID but many times she only takes 20 units.  Patient denies hypoglycemic events.  Patient reported dietary habits: has had a harder time eating recently due to nausea and vomiting from gastroparesis. She vomits 3 times per day. Cannot get surgery for gastroparesis until her diabetes is better controlled.  Patient reported exercise habits: she cannot exercise due to neuropathy   Patient reports nocturia.  Patient reports neuropathy. Patient reports visual changes. Patient reports self foot exams.     O:  Lab Results  Component Value Date   HGBA1C >15 11/25/2015   There were no vitals filed for this visit.  Home fasting CBG: 200s-300s 2 hour post-prandial/random CBG: 300s-500s  A/P: Diabetes longstanding currently UNcontrolled based on A1c >15 and home CBGs. Patient denies hypoglycemic events and is able to verbalize appropriate hypoglycemia management plan. Patient reports adherence with medication. I do question whether she misses it at times but she denies missed doses.  Increased Toujeo to 35 units BID and increase insulin regular to consistent 30 units before meals. Though her Toujeo dose was split, it does not appear to have improved her control. Will continue to aggressively titrate her insulin to try to get her more controlled. I will try to find out what dose of insulin she was on when she had a pump since she reports that she was controlled when she had a pump.  Of note, switched gabapentin to 300 mg capsules from 100 mg capsules but continued same total daily dose to lessen pill burden.   Also discussed with MD  - recurrent yeast infection so will give fluconazole x 1 day.   Still recommend that she follow up with endocrinology.   Next A1C anticipated February 2018.    Written patient instructions provided.  Total time in face to face counseling 30 minutes.   Follow up with me in 2 weeks. Patient seen with Windell Moulding, PharmD Candidate .

## 2016-02-05 ENCOUNTER — Emergency Department (HOSPITAL_COMMUNITY)
Admission: EM | Admit: 2016-02-05 | Discharge: 2016-02-05 | Disposition: A | Discharge status: Home / Self Care | Payer: Self-pay | Attending: Emergency Medicine | Admitting: Emergency Medicine

## 2016-02-05 DIAGNOSIS — R739 Hyperglycemia, unspecified: Secondary | ICD-10-CM

## 2016-02-05 LAB — CBG MONITORING, ED
Glucose-Capillary: 391 mg/dL — ABNORMAL HIGH (ref 65–99)
Glucose-Capillary: 159 mg/dL — ABNORMAL HIGH (ref 65–99)

## 2016-02-05 MED ORDER — INSULIN ASPART 100 UNIT/ML IV SOLN
10.0000 [IU] | Freq: Once | INTRAVENOUS | Status: AC
  Administered 2016-02-05: 10 [IU] via INTRAVENOUS

## 2016-02-05 MED ORDER — IPRATROPIUM-ALBUTEROL 0.5-2.5 (3) MG/3ML IN SOLN: 3.0000 mL | Freq: Once | RESPIRATORY_TRACT | Status: DC

## 2016-02-05 MED ORDER — SODIUM CHLORIDE 0.9 % IV BOLUS (SEPSIS)
1000.0000 mL | Freq: Once | INTRAVENOUS | Status: AC
  Administered 2016-02-05: 1000 mL via INTRAVENOUS

## 2016-02-05 MED ORDER — INSULIN ASPART 100 UNIT/ML ~~LOC~~ SOLN
SUBCUTANEOUS | Status: AC
  Filled 2016-02-05: qty 1

## 2016-02-05 NOTE — Discharge Instructions (Signed)
You  need to work with your primary care physician to adjust your insulin dosage.

## 2016-02-05 NOTE — ED Provider Notes (Signed)
[name] Provider Note   CSN: [id number] Arrival date & time: 02/04/16  2018   By signing my name below, I, Susan Horton, attest that this documentation has been prepared under the direction and in the presence of Susan Fuel, MD  Electronically Signed: Delton Horton, ED Scribe. 02/05/16. 1:44 AM.   History   Chief Complaint Chief Complaint  Patient presents with  . Hyperglycemia   The history is provided by the patient. No language interpreter was used.   HPI Comments:  Susan Horton is a 46 y.o. female, with a hx of DM, who presents to the Emergency Department complaining of high blood sugar levels x today. She also reports bilateral lower extremity pain x several days, frequency and daily vomiting due to gastroparesis. Her highest blood sugar reading was 591. Pt does not recall when her blood sugar level was last normal. She has changed her insulin dose recently. No alleviating factors noted. Pt denies chills, diaphoresis, fevers, cough, SOB, diarrhea and difficulty urinating. No other associated symptoms noted.   Past Medical History:  Diagnosis Date  . Asthma    prn inhaler  . Diabetes mellitus    IDDM  . DVT (deep venous thrombosis) ([place])    20+ yrs ago (during pregnancy), 2015  . Essential hypertension, benign 12/11/2015  . GERD (gastroesophageal reflux disease)   . Headache(784.0)    migraines  . Hidradenitis     Patient Active Problem List   Diagnosis Date Noted  . Essential hypertension, benign 12/11/2015  . Diabetic nephropathy associated with type 2 diabetes mellitus ([place]) 12/11/2015  . Diabetic gastroparesis ([place])   . Chest pain 06/05/2014  . Intractable nausea and vomiting 06/05/2014  . Uncontrolled type 2 diabetes mellitus with peripheral neuropathy ([place]) 06/05/2014  . Normocytic anemia 06/05/2014  . Hyperglycemia   . Monilial vaginitis 07/10/2010    Past Surgical History:  Procedure Laterality Date  . APPENDECTOMY  1995  . BREAST  REDUCTION SURGERY  2002  . CHOLECYSTECTOMY  1993  . EXPLORATORY LAPAROTOMY  08/14/2005   lysis of adhesions, drainage of tubo-ovarian abscess  . HYDRADENITIS EXCISION  02/08/2011   Procedure: EXCISION HYDRADENITIS GROIN;  Surgeon: [name], MD;  Location: [facility];  Service: General;  Laterality: N/A;  Excisioin of Hidradenitis Left groin  . INGUINAL HIDRADENITIS EXCISION  07/06/2010   bilateral  . TONSILLECTOMY    . TUBAL LIGATION  1996  . tummy tuck    . VAGINAL HYSTERECTOMY  08/04/2005   and cysto    OB History    No data available       Home Medications    Prior to Admission medications   Medication Sig Start Date End Date Taking? Authorizing Provider  albuterol (PROVENTIL HFA;VENTOLIN HFA) 108 (90 BASE) MCG/ACT inhaler Inhale 1 puff into the lungs every 6 (six) hours as needed for wheezing or shortness of breath.    Historical Provider, MD  amLODipine (NORVASC) 10 MG tablet Take 1 tablet (10 mg total) by mouth daily. 01/21/16   [name], MD  Ferrous Sulfate (IRON) 325 (65 Fe) MG TABS Take 325 mg by mouth daily. 11/25/15   Tiffany Daneil Dan, PA-C  Fluticasone-Salmeterol (ADVAIR) 250-50 MCG/DOSE AEPB Inhale 1 puff into the lungs 2 (two) times daily as needed (FOR SHORTNESS OF BREATH).     Historical Provider, MD  gabapentin (NEURONTIN) 300 MG capsule Take 2 capsules (600 mg total) by mouth 2 (two) times daily. 02/04/16   [name],  MD  Insulin Glargine (TOUJEO SOLOSTAR) 300 UNIT/ML SOPN Inject 35 Units into the skin 2 (two) times daily. 02/04/16   [name], MD  insulin regular (NOVOLIN R,HUMULIN R) 250 units/2.8mL (100 units/mL) injection Inject 0.3 mLs (30 Units total) into the skin 3 (three) times daily before meals. Sliding scale 01/13/16   [name], MD  Insulin Syringe 27G X 1/2" 0.5 ML MISC 1 Syringe by Does not apply route 5 (five) times daily. 11/20/15   Orlie Dakin, MD  losartan (COZAAR) 100 MG tablet Take 1 tablet  (100 mg total) by mouth daily. 01/21/16   [name], MD  lovastatin (MEVACOR) 20 MG tablet Take 20 mg by mouth daily.    Historical Provider, MD  metoCLOPramide (REGLAN) 5 MG tablet Take 1 tablet (5 mg total) by mouth 4 (four) times daily -  before meals and at bedtime. 01/21/16   [name], MD  metoprolol tartrate (LOPRESSOR) 25 MG tablet Take 1 tablet (25 mg total) by mouth 2 (two) times daily. 01/21/16   [name], MD  montelukast (SINGULAIR) 10 MG tablet Take 10 mg by mouth daily as needed (allergies).     Historical Provider, MD  omeprazole (PRILOSEC) 40 MG capsule Take 40 mg by mouth at bedtime.     Historical Provider, MD    Family History Family History  Problem Relation Age of Onset  . Hypertension Father   . Hypertension Sister   . Hypertension Brother     Social History Social History  Substance Use Topics  . Smoking status: Current Every Day Smoker    Packs/day: 0.50    Years: 20.00    Types: Cigarettes  . Smokeless tobacco: Never Used     Comment: 1 pack/week  . Alcohol use No     Allergies   Penicillins; Lisinopril; and Penicillin g   Review of Systems Review of Systems  Constitutional: Negative for chills, diaphoresis and fever.  Respiratory: Negative for cough and shortness of breath.   Gastrointestinal: Positive for nausea and vomiting. Negative for diarrhea.  Genitourinary: Positive for frequency. Negative for difficulty urinating.  Musculoskeletal: Positive for myalgias.  All other systems reviewed and are negative.  Physical Exam Updated Vital Signs BP 175/86 (BP Location: Right Arm)   Pulse 108   Temp 98.4 F (36.9 C) (Oral)   Resp 18   SpO2 100%   Physical Exam  Constitutional: She is oriented to person, place, and time. She appears well-developed and well-nourished.  HENT:  Head: Normocephalic and atraumatic.  Eyes: EOM are normal. Pupils are equal, round, and reactive to light.  Neck: Normal range of motion.  Neck supple. No JVD present.  Cardiovascular: Normal rate, regular rhythm and normal heart sounds.   No murmur heard. Pulmonary/Chest: Effort normal and breath sounds normal. She has no wheezes. She has no rales. She exhibits no tenderness.  Abdominal: Soft. Bowel sounds are normal. She exhibits no distension and no mass. There is no tenderness.  Musculoskeletal: Normal range of motion. She exhibits no edema.  Lymphadenopathy:    She has no cervical adenopathy.  Neurological: She is alert and oriented to person, place, and time. No cranial nerve deficit. She exhibits normal muscle tone. Coordination normal.  Skin: Skin is warm and dry. No rash noted.  Psychiatric: She has a normal mood and affect. Her behavior is normal. Judgment and thought content normal.  Nursing note and vitals reviewed.    ED Treatments / Results  DIAGNOSTIC STUDIES:  Oxygen Saturation is 100% on RA, normal by my interpretation.    COORDINATION OF CARE:  1:43 AM Discussed treatment plan with pt at bedside and pt agreed to plan.  Labs (all labs ordered are listed, but only abnormal results are displayed) Labs Reviewed  BASIC METABOLIC PANEL - Abnormal; Notable for the following:       Result Value   Sodium 131 (*)    Chloride 100 (*)    Glucose, Bld 378 (*)    BUN 32 (*)    Creatinine, Ser 2.07 (*)    GFR calc non Af Amer 28 (*)    GFR calc Af Amer 32 (*)    All other components within normal limits  CBC - Abnormal; Notable for the following:    RBC 3.71 (*)    Hemoglobin 9.2 (*)    HCT 28.7 (*)    MCV 77.4 (*)    MCH 24.8 (*)    Platelets 471 (*)    All other components within normal limits  URINALYSIS, ROUTINE W REFLEX MICROSCOPIC - Abnormal; Notable for the following:    APPearance CLOUDY (*)    Glucose, UA >=500 (*)    Hgb urine dipstick SMALL (*)    Protein, ur 100 (*)    Bacteria, UA MANY (*)    Squamous Epithelial / LPF 0-5 (*)    All other components within normal limits  CBG  MONITORING, ED - Abnormal; Notable for the following:    Glucose-Capillary 359 (*)    All other components within normal limits    Procedures Procedures (including critical care time)  Medications Ordered in ED Medications  sodium chloride 0.9 % bolus 1,000 mL (0 mLs Intravenous Stopped 02/05/16 0340)  insulin aspart (novoLOG) injection 10 Units (10 Units Intravenous Given 02/05/16 0249)     Initial Impression / Assessment and Plan / ED Course  I have reviewed the triage vital signs and the nursing notes.  Pertinent lab results that were available during my care of the patient were reviewed by me and considered in my medical decision making (see chart for details).  Patient with reports of hyperglycemia home. Blood glucose is moderately elevated here but not severely elevated. She was given IV fluids and insulin and glucose is come down to 159. Old records are reviewed, and she has prior ED visits for hyperglycemia. She is discharged with instructions to discuss with PCP had to adjust her insulin dose appropriately.  Final Clinical Impressions(s) / ED Diagnoses   Final diagnoses:  Hyperglycemia    New Prescriptions New Prescriptions   No medications on file    I personally performed the services described in this documentation, which was scribed in my presence. The recorded information has been reviewed and is accurate.       Susan Fuel, MD 16/57/90 [id number]

## 2016-02-09 ENCOUNTER — Telehealth: Payer: Self-pay | Admitting: Internal Medicine

## 2016-02-09 NOTE — Telephone Encounter (Signed)
Patient called states hands and legs are very painful, and feels weak,pt states she fell this morning due to pain....medication that she is currently taking is not helping her . Pt would like to know what to do. Please f/up

## 2016-02-10 NOTE — Telephone Encounter (Signed)
Pt has not noticed a lot of swelling. C/o  shooting pain, numbness and tingling in legs and feet. Taking medications as prescribed. Neurontin not helping. Blood sugars are in 300's. Pt advised to drink more water to lower blood sugars. Please advise

## 2016-02-11 ENCOUNTER — Encounter: Payer: Self-pay | Admitting: Internal Medicine

## 2016-02-11 LAB — HM DIABETES EYE EXAM

## 2016-02-11 NOTE — Telephone Encounter (Signed)
Please write new regimen for gabapentin

## 2016-02-11 NOTE — Telephone Encounter (Signed)
Symptoms are suggestive of diabetic neuropathy. Patient is on gabapentin. We can go up on the dosage but we need to control the blood sugar to hopefully alleviate her symptoms.

## 2016-02-12 ENCOUNTER — Telehealth: Payer: Self-pay | Admitting: Pharmacist

## 2016-02-12 DIAGNOSIS — K3184 Gastroparesis: Principal | ICD-10-CM

## 2016-02-12 DIAGNOSIS — E1143 Type 2 diabetes mellitus with diabetic autonomic (poly)neuropathy: Secondary | ICD-10-CM

## 2016-02-12 NOTE — Telephone Encounter (Signed)
Routed to [employer], Pharmacist for medication  Dosage and regimen. Will inform patient.

## 2016-02-12 NOTE — Telephone Encounter (Signed)
For patient's renal function, the recommended dose is gabapentin 200 to 700 mg twice daily. She is currently on 600 mg twice daily. But it may not be working if she is vomiting the doses up. Could consider a switch to Lyrica if she is keeping the gabapentin done? Dr. Doreene Burke, I will defer to you!

## 2016-02-12 NOTE — Telephone Encounter (Signed)
Will leave patient on current dosage because of her renal function status. The area to focus on is in her blood sugar control, no matter how many neuropathic pain medication available or taken, the primary disease will continue drive the symptom if it remains uncontrolled.

## 2016-02-16 ENCOUNTER — Ambulatory Visit: Payer: Self-pay | Attending: Internal Medicine | Admitting: Podiatry

## 2016-02-16 DIAGNOSIS — N184 Chronic kidney disease, stage 4 (severe): Secondary | ICD-10-CM

## 2016-02-16 DIAGNOSIS — IMO0002 Reserved for concepts with insufficient information to code with codable children: Secondary | ICD-10-CM

## 2016-02-16 DIAGNOSIS — E0865 Diabetes mellitus due to underlying condition with hyperglycemia: Secondary | ICD-10-CM

## 2016-02-16 DIAGNOSIS — Z794 Long term (current) use of insulin: Secondary | ICD-10-CM

## 2016-02-16 DIAGNOSIS — E0822 Diabetes mellitus due to underlying condition with diabetic chronic kidney disease: Secondary | ICD-10-CM

## 2016-02-16 DIAGNOSIS — G629 Polyneuropathy, unspecified: Secondary | ICD-10-CM

## 2016-02-16 MED ORDER — CAPSAICIN 0.075 % EX CREA: TOPICAL_CREAM | Freq: Two times a day (BID) | CUTANEOUS | Status: DC

## 2016-02-16 MED FILL — PREDNISOLONE AC 1% EYE DROP: 1 | 15 days supply | Qty: 10 | Fill #0

## 2016-02-16 MED FILL — KETOROLAC 0.4% OPHTH SOLN: 0.4 | 15 days supply | Qty: 5 | Fill #0

## 2016-02-16 MED FILL — OFLOXACIN 0.3% EYE DROPS: 0.3 | 28 days supply | Qty: 10 | Fill #0

## 2016-02-16 NOTE — Patient Instructions (Signed)
Neuropathic Pain Introduction Neuropathic pain is pain caused by damage to the nerves that are responsible for certain sensations in your body (sensory nerves). The pain can be caused by damage to:  The sensory nerves that send signals to your spinal cord and brain (peripheral nervous system).  The sensory nerves in your brain or spinal cord (central nervous system). Neuropathic pain can make you more sensitive to pain. What would be a minor sensation for most people may feel very painful if you have neuropathic pain. This is usually a long-term condition that can be difficult to treat. The type of pain can differ from person to person. It may start suddenly (acute), or it may develop slowly and last for a long time (chronic). Neuropathic pain may come and go as damaged nerves heal or may stay at the same level for years. It often causes emotional distress, loss of sleep, and a lower quality of life. What are the causes? The most common cause of damage to a sensory nerve is diabetes. Many other diseases and conditions can also cause neuropathic pain. Causes of neuropathic pain can be classified as:  Toxic. Many drugs and chemicals can cause toxic damage. The most common cause of toxic neuropathic pain is damage from drug treatment for cancer (chemotherapy).  Metabolic. This type of pain can happen when a disease causes imbalances that damage nerves. Diabetes is the most common of these diseases. Vitamin B deficiency caused by long-term alcohol abuse is another common cause.  Traumatic. Any injury that cuts, crushes, or stretches a nerve can cause damage and pain. A common example is feeling pain after losing an arm or leg (phantom limb pain).  Compression-related. If a sensory nerve gets trapped or compressed for a long period of time, the blood supply to the nerve can be cut off.  Vascular. Many blood vessel diseases can cause neuropathic pain by decreasing blood supply and oxygen to  nerves.  Autoimmune. This type of pain results from diseases in which the body's defense system mistakenly attacks sensory nerves. Examples of autoimmune diseases that can cause neuropathic pain include lupus and multiple sclerosis.  Infectious. Many types of viral infections can damage sensory nerves and cause pain. Shingles infection is a common cause of this type of pain.  Inherited. Neuropathic pain can be a symptom of many diseases that are passed down through families (genetic). What are the signs or symptoms? The main symptom is pain. Neuropathic pain is often described as:  Burning.  Shock-like.  Stinging.  Hot or cold.  Itching. How is this diagnosed? No single test can diagnose neuropathic pain. Your health care provider will do a physical exam and ask you about your pain. You may use a pain scale to describe how bad your pain is. You may also have tests to see if you have a high sensitivity to pain and to help find the cause and location of any sensory nerve damage. These tests may include:  Imaging studies, such as:  X-rays.  CT scan.  MRI.  Nerve conduction studies to test how well nerve signals travel through your sensory nerves (electrodiagnostic testing).  Stimulating your sensory nerves through electrodes on your skin and measuring the response in your spinal cord and brain (somatosensory evoked potentials). How is this treated? Treatment for neuropathic pain may change over time. You may need to try different treatment options or a combination of treatments. Some options include:  Over-the-counter pain relievers.  Prescription medicines. Some medicines used to treat   other conditions may also help neuropathic pain. These include medicines to:  Control seizures (anticonvulsants).  Relieve depression (antidepressants).  Prescription-strength pain relievers (narcotics). These are usually used when other pain relievers do not help.  Transcutaneous nerve  stimulation (TENS). This uses electrical currents to block painful nerve signals. The treatment is painless.  Topical and local anesthetics. These are medicines that numb the nerves. They can be injected as a nerve block or applied to the skin.  Alternative treatments, such as:  Acupuncture.  Meditation.  Massage.  Physical therapy.  Pain management programs.  Counseling. Follow these instructions at home:  Learn as much as you can about your condition.  Take medicines only as directed by your health care provider.  Work closely with all your health care providers to find what works best for you.  Have a good support system at home.  Consider joining a chronic pain support group. Contact a health care provider if:  Your pain treatments are not helping.  You are having side effects from your medicines.  You are struggling with fatigue, mood changes, depression, or anxiety. This information is not intended to replace advice given to you by your health care provider. Make sure you discuss any questions you have with your health care provider. Document Released: 09/25/2003 Document Revised: 07/18/2015 Document Reviewed: 06/07/2013  2017 Elsevier  

## 2016-02-18 ENCOUNTER — Ambulatory Visit: Payer: Self-pay | Attending: Internal Medicine | Admitting: Pharmacist

## 2016-02-18 DIAGNOSIS — Z794 Long term (current) use of insulin: Secondary | ICD-10-CM | POA: Insufficient documentation

## 2016-02-18 DIAGNOSIS — E1165 Type 2 diabetes mellitus with hyperglycemia: Secondary | ICD-10-CM

## 2016-02-18 DIAGNOSIS — IMO0002 Reserved for concepts with insufficient information to code with codable children: Secondary | ICD-10-CM

## 2016-02-18 DIAGNOSIS — E1142 Type 2 diabetes mellitus with diabetic polyneuropathy: Secondary | ICD-10-CM

## 2016-02-18 DIAGNOSIS — E119 Type 2 diabetes mellitus without complications: Secondary | ICD-10-CM | POA: Insufficient documentation

## 2016-02-18 MED ORDER — INSULIN REGULAR HUMAN 100 UNIT/ML IJ SOLN: 30.0000 [IU] | Freq: Three times a day (TID) | INTRAMUSCULAR | 3 refills | Status: DC

## 2016-02-18 MED ORDER — MIRTAZAPINE 15 MG PO TBDP: 15.0000 mg | ORAL_TABLET | Freq: Every day | ORAL | 1 refills | Status: DC

## 2016-02-18 MED FILL — CALCITRIOL 0.25 MCG CAPSULE: 0.25 | 31 days supply | Qty: 31 | Fill #0

## 2016-02-18 MED FILL — MIRTAZAPINE 15 MG ODT: 15 | 30 days supply | Qty: 30 | Fill #0

## 2016-02-18 NOTE — Patient Instructions (Addendum)
Thanks for coming to see [name]!  Start mirtazapine 15 mg at bedtime - it will help you sleep, help the nausea/vomiting, and the pain. Do not drive until you know how this medication will affect you. Let us know if you have any mood changes.  Increase Toujeo to 40 units twice daily.  See Dr. Doreene Burke in 2 weeks. If you can't get in to see him, come back to see [name]!

## 2016-02-18 NOTE — Progress Notes (Signed)
    S:    Patient arrives in good spirits.  Presents for diabetes evaluation, education, and management at the request of Elio Forget and Dr. Doreene Burke. Patient was referred on 11/25/15.  Last visit with Dr. Doreene Burke was 01/21/16.  Patient reports adherence with medications.  Current diabetes medications include: Toujeo 35 units BID, and regular insulin 30 units TID   Patient denies hypoglycemic events.  Patient reported dietary habits: she continues to vomit at least three times a day which makes eating difficult.  Patient reported exercise habits: she cannot exercise due to neuropathy   Patient reports nocturia.  Patient reports neuropathy. Patient reports visual changes. Patient reports self foot exams.     O:  Lab Results  Component Value Date   HGBA1C >15 11/25/2015   There were no vitals filed for this visit.  Home fasting CBG: 200s-300s 2 hour post-prandial/random CBG: 200s-300s  A/P: Diabetes longstanding currently UNcontrolled based on A1c >15 but appears to be improving with no home CBGs in 400s or 500s. Patient denies hypoglycemic events and is able to verbalize appropriate hypoglycemia management plan. Patient reports adherence with medication.  Increased Toujeo to 40 units BID and continue insulin regular to consistent 30 units before meals. Her blood glucose levels are slowly improving. Will continue to titrate her insulin.  Still recommend that she follow up with endocrinology.   Discussed gastroparesis with Dr. Doreene Burke. Patient continues to have issues sleeping PHQ9 = 11) and eating. There is data that mirtazapine can relieve the severity of refractory gastroparesis and she would benefit from the help sleeping and eating. Will start mirtazapine 15 mg every night at bedtime. Counseled patient on drowsiness, increased appetite, and to watch out for any changes in mood. I am hopeful this will mitigate some of her symptoms to allow [name] to get there blood glucose to  goal and then ultimately eligible for the gastric pacemaker. Patient verbalizes understanding of this information.  Next A1C anticipated February 2018.    Written patient instructions provided.  Total time in face to face counseling 30 minutes.   Follow up with Dr. Doreene Burke in 2 weeks. Patient seen with Windell Moulding, PharmD Candidate .

## 2016-02-20 NOTE — Progress Notes (Signed)
[facility]  Subjective: 46 year old female presents the office today for concerns of burning pain to both of her feet which is been ongoing for quite some time. She states that she is currently on gabapentin 600 mg twice a day. Her pain is worse at night. Her last HbA1c was greater than 15. She denies any swelling or any open sores. Denies any systemic complaints such as fevers, chills, nausea, vomiting. No acute changes since last appointment, and no other complaints at this time.   Objective: AAO x3, NAD DP/PT pulses palpable bilaterally, CRT less than 3 seconds Sensation decreased with Simms Weinstein monofilament.  There is no overlying edema, erythema, increase in warmth bilaterally. There is no area pinpoint bony tenderness or pain the vibratory sensation. There is no open sores identified. No pain with calf compression, swelling, warmth, erythema  Assessment: Uncontrolled diabetic with neuropathy  Plan: -All treatment options discussed with the patient including all alternatives, risks, complications.  -She is on the on gabapentin. Discussed alternative to gabapentin. Wishes to hold off any further oral medication. We will try topical capsaicin cream which is ordered today. -Discussed daily foot inspection. -Patient encouraged to call the office with any questions, concerns, change in symptoms.   Celesta Gentile, DPM

## 2016-02-24 ENCOUNTER — Other Ambulatory Visit: Payer: Self-pay | Admitting: Internal Medicine

## 2016-02-24 MED ORDER — CAPSAICIN-MENTHOL-METHYL SAL 0.025-1-12 % EX CREA: 1.0000 "application " | TOPICAL_CREAM | Freq: Four times a day (QID) | CUTANEOUS | 3 refills | Status: DC | PRN

## 2016-02-25 ENCOUNTER — Other Ambulatory Visit: Payer: Self-pay | Admitting: Pharmacist

## 2016-02-25 MED ORDER — INSULIN REGULAR HUMAN 100 UNIT/ML IJ SOLN: 30.0000 [IU] | Freq: Three times a day (TID) | INTRAMUSCULAR | 3 refills | Status: DC

## 2016-02-25 MED FILL — !NOVOLIN R 100 UNITS/ML VIA: 100 | 33 days supply | Qty: 30 | Fill #0

## 2016-03-10 ENCOUNTER — Ambulatory Visit: Payer: Self-pay | Attending: Internal Medicine | Admitting: Internal Medicine

## 2016-03-10 DIAGNOSIS — G629 Polyneuropathy, unspecified: Secondary | ICD-10-CM | POA: Insufficient documentation

## 2016-03-10 DIAGNOSIS — E1142 Type 2 diabetes mellitus with diabetic polyneuropathy: Secondary | ICD-10-CM | POA: Insufficient documentation

## 2016-03-10 DIAGNOSIS — K3184 Gastroparesis: Secondary | ICD-10-CM

## 2016-03-10 DIAGNOSIS — I10 Essential (primary) hypertension: Secondary | ICD-10-CM

## 2016-03-10 DIAGNOSIS — IMO0002 Reserved for concepts with insufficient information to code with codable children: Secondary | ICD-10-CM

## 2016-03-10 DIAGNOSIS — E1165 Type 2 diabetes mellitus with hyperglycemia: Secondary | ICD-10-CM | POA: Insufficient documentation

## 2016-03-10 DIAGNOSIS — E1143 Type 2 diabetes mellitus with diabetic autonomic (poly)neuropathy: Secondary | ICD-10-CM

## 2016-03-10 LAB — GLUCOSE, POCT (MANUAL RESULT ENTRY)
POC GLUCOSE: 567 mg/dL — AB (ref 70–99)
POC Glucose: 582 mg/dl — AB (ref 70–99)

## 2016-03-10 LAB — POCT GLYCOSYLATED HEMOGLOBIN (HGB A1C)

## 2016-03-10 MED ORDER — INSULIN ASPART 100 UNIT/ML ~~LOC~~ SOLN
20.0000 [IU] | Freq: Once | SUBCUTANEOUS | Status: AC
  Administered 2016-03-10: 20 [IU] via SUBCUTANEOUS

## 2016-03-10 MED ORDER — GABAPENTIN 300 MG PO CAPS: 600.0000 mg | ORAL_CAPSULE | Freq: Three times a day (TID) | ORAL | 3 refills | Status: DC

## 2016-03-10 NOTE — Progress Notes (Signed)
Patient is here for DM  Patient complains of bilateral leg and foot pain being present. Patient is scaled at a 10+ at this time.  Patient has taken 55 units of insulin total today. Patient has eaten today.  Patient states she has vomited 10x today.  Patient tolerated 20 units of insulin well today.

## 2016-03-10 NOTE — Progress Notes (Signed)
Subjective:  Patient ID: Susan Horton, female    DOB: 1971/01/01  Age: 46 y.o. MRN: [id number] CC:  HPI Susan Horton presents for f/u for DM. Today her glucose is 567 and her HbA1C is >15. She reports being compliant with her medications and says that this sugar is despite having taken 35u of toujeo and 30u of novolog. She says that she tries to watch her diet and is frustrated at the elevated sugars. She is on and arb and a statin. She also complains of symptoms related to her chronic gastroparesis including nausea and emesis. She says she often doesn't eat normal meals due to symptoms.   Past Medical History:  Diagnosis Date  . Asthma    prn inhaler  . Diabetes mellitus    IDDM  . DVT (deep venous thrombosis) ([place])    20+ yrs ago (during pregnancy), 2015  . Essential hypertension, benign 12/11/2015  . GERD (gastroesophageal reflux disease)   . Headache(784.0)    migraines  . Hidradenitis     Past Surgical History:  Procedure Laterality Date  . APPENDECTOMY  1995  . BREAST REDUCTION SURGERY  2002  . CHOLECYSTECTOMY  1993  . EXPLORATORY LAPAROTOMY  08/14/2005   lysis of adhesions, drainage of tubo-ovarian abscess  . HYDRADENITIS EXCISION  02/08/2011   Procedure: EXCISION HYDRADENITIS GROIN;  Surgeon: [name], MD;  Location: [facility];  Service: General;  Laterality: N/A;  Excisioin of Hidradenitis Left groin  . INGUINAL HIDRADENITIS EXCISION  07/06/2010   bilateral  . TONSILLECTOMY    . TUBAL LIGATION  1996  . tummy tuck    . VAGINAL HYSTERECTOMY  08/04/2005   and cysto    Family History  Problem Relation Age of Onset  . Hypertension Father   . Hypertension Sister   . Hypertension Brother     Social History  Substance Use Topics  . Smoking status: Current Every Day Smoker    Packs/day: 0.50    Years: 20.00    Types: Cigarettes  . Smokeless tobacco: Never Used     Comment: 1 pack/week  . Alcohol use No    ROS Review of Systems   Constitutional: Positive for fatigue. Negative for activity change, appetite change and unexpected weight change.  HENT: Negative.   Eyes: Negative.   Respiratory: Negative.   Cardiovascular: Negative.   Gastrointestinal: Positive for nausea and vomiting.  Endocrine: Positive for polydipsia, polyphagia and polyuria.  Genitourinary: Negative.   Musculoskeletal: Negative.   Skin: Negative.   Neurological: Negative.   Psychiatric/Behavioral: Negative.     Objective:   Today's Vitals: There were no vitals taken for this visit.  Physical Exam  Constitutional: She is oriented to person, place, and time. She appears well-developed and well-nourished.  HENT:  Head: Normocephalic.  Eyes: Pupils are equal, round, and reactive to light.  Neck: Normal range of motion. No JVD present.  Cardiovascular: Normal rate and regular rhythm.   Pulmonary/Chest: Effort normal and breath sounds normal. No respiratory distress. She has no wheezes.  Abdominal: Soft. She exhibits no distension. There is tenderness.  Musculoskeletal: Normal range of motion.  Neurological: She is alert and oriented to person, place, and time.  Skin: Skin is warm and dry.  Psychiatric: She has a normal mood and affect. Her behavior is normal.    Assessment & Plan:   Diabetes Mellitus Type 2, insulin dependent - not controlled. We will give her 20 u of novolog in the clinic and  check her urine for ketones. She is not willing to stay in the clinic to get fluids today or to have [name] re-check her sugar. She says that she will check her sugars at home and manage it accordingly. We discussed the need for increased blood sugar control and associated risks. The leg pain she describes is likely related to her elevated sugars.   Outpatient Encounter Prescriptions as of 03/10/2016  Medication Sig  . albuterol (PROVENTIL HFA;VENTOLIN HFA) 108 (90 BASE) MCG/ACT inhaler Inhale 1 puff into the lungs every 6 (six) hours as needed for  wheezing or shortness of breath.  [name] amLODipine (NORVASC) 10 MG tablet Take 1 tablet (10 mg total) by mouth daily.  . Capsaicin-Menthol-Methyl Sal (CAPSAICIN-METHYL SAL-MENTHOL) 0.025-1-12 % CREA Apply 1 application topically 4 (four) times daily as needed. For aches/pains, apply to skin. May need gloves.  . Ferrous Sulfate (IRON) 325 (65 Fe) MG TABS Take 325 mg by mouth daily.  . Fluticasone-Salmeterol (ADVAIR) 250-50 MCG/DOSE AEPB Inhale 1 puff into the lungs 2 (two) times daily as needed (FOR SHORTNESS OF BREATH).   [name] gabapentin (NEURONTIN) 300 MG capsule Take 2 capsules (600 mg total) by mouth 3 (three) times daily.  . Insulin Glargine (TOUJEO SOLOSTAR) 300 UNIT/ML SOPN Inject 35 Units into the skin 2 (two) times daily.  . insulin regular (NOVOLIN R,HUMULIN R) 250 units/2.63mL (100 units/mL) injection Inject 0.3 mLs (30 Units total) into the skin 3 (three) times daily before meals. Sliding scale  . Insulin Syringe 27G X 1/2" 0.5 ML MISC 1 Syringe by Does not apply route 5 (five) times daily.  [name] losartan (COZAAR) 100 MG tablet Take 1 tablet (100 mg total) by mouth daily.  [name] lovastatin (MEVACOR) 20 MG tablet Take 20 mg by mouth daily.  . metoCLOPramide (REGLAN) 5 MG tablet Take 1 tablet (5 mg total) by mouth 4 (four) times daily -  before meals and at bedtime.  . metoprolol tartrate (LOPRESSOR) 25 MG tablet Take 1 tablet (25 mg total) by mouth 2 (two) times daily.  . mirtazapine (REMERON SOL-TAB) 15 MG disintegrating tablet Take 1 tablet (15 mg total) by mouth at bedtime.  . montelukast (SINGULAIR) 10 MG tablet Take 10 mg by mouth daily as needed (allergies).   [name] omeprazole (PRILOSEC) 40 MG capsule Take 40 mg by mouth at bedtime.   . [DISCONTINUED] gabapentin (NEURONTIN) 300 MG capsule Take 2 capsules (600 mg total) by mouth 2 (two) times daily.   Facility-Administered Encounter Medications as of 03/10/2016  Medication  . capsicum (ZOSTRIX) 0.075 % cream    Follow-up: Return in about 4 weeks  (around 04/07/2016) for CPP [facility] for BP/CBG and DM management.   Beckey Rutter, AGNP Student  Data Review Lab Results  Component Value Date   HGBA1C >15.0 03/10/2016   HGBA1C >15 11/25/2015   HGBA1C 12.4 09/03/2014    Assessment & Plan   1. Uncontrolled type 2 diabetes mellitus with peripheral neuropathy (HCC)  - POCT A1C - Glucose (CBG) - Ambulatory referral to Endocrinology - Glucose (CBG) - insulin aspart (novoLOG) injection 20 Units; Inject 0.2 mLs (20 Units total) into the skin once.  2. Neuropathy (HCC)  - Continue Gabapentin  3. Essential hypertension  We have discussed target BP range and blood pressure goal. I have advised patient to check BP regularly and to call us back or report to clinic if the numbers are consistently higher than 140/90. We discussed the importance of compliance with medical therapy and DASH diet recommended, consequences  of uncontrolled hypertension discussed.  - continue current BP medications  4. Diabetic gastroparesis (HCC)  - gabapentin (NEURONTIN) 300 MG capsule; Take 2 capsules (600 mg total) by mouth 3 (three) times daily.  Dispense: 180 capsule; Refill: 3  Patient have been counseled extensively about nutrition and exercise. Other issues discussed during this visit include: low cholesterol diet, weight control and daily exercise, foot care, annual eye examinations at Ophthalmology, importance of adherence with medications and regular follow-up. We also discussed long term complications of uncontrolled diabetes and hypertension.   Return in about 4 weeks (around 04/07/2016) for CPP [facility] for BP/CBG and DM management.  The patient was given clear instructions to go to ER or return to medical center if symptoms don't improve, worsen or new problems develop. The patient verbalized understanding. The patient was told to call to get lab results if they haven't heard anything in the next week.   This note has been created with Biomedical engineer. Any transcriptional errors are unintentional.    Angelica Chessman, MD, Miguel Barrera, Karilyn Cota, [place] and [place], [place]   03/12/2016, 6:57 PM

## 2016-03-10 NOTE — Patient Instructions (Signed)
Diabetic Neuropathy Diabetic neuropathy is a nerve disease or nerve damage that is caused by diabetes mellitus. About half of all people with diabetes mellitus have some form of nerve damage. Nerve damage is more common in those who have had diabetes mellitus for many years and who generally have not had good control of their blood sugar (glucose) level. Diabetic neuropathy is a common complication of diabetes mellitus. There are three common types of diabetic neuropathy and a fourth type that is less common and less understood:  Peripheral neuropathy-This is the most common type of diabetic neuropathy. It causes damage to the nerves of the feet and legs first and then eventually the hands and arms. The damage affects the ability to sense touch.  Autonomic neuropathy-This type causes damage to the autonomic nervous system, which controls the following functions:  Heartbeat.  Body temperature.  Blood pressure.  Urination.  Digestion.  Sweating.  Sexual function.  Focal neuropathy-Focal neuropathy can be painful and unpredictable and occurs most often in older adults with diabetes mellitus. It involves a specific nerve or one area and often comes on suddenly. It usually does not cause long-term problems.  Radiculoplexus neuropathy- Sometimes called lumbosacral radiculoplexus neuropathy, radiculoplexus neuropathy affects the nerves of the thighs, hips, buttocks, or legs. It is more common in people with type 2 diabetes mellitus and in older men. It is characterized by debilitating pain, weakness, and atrophy, usually in the thigh muscles. What are the causes? The cause of peripheral, autonomic, and focal neuropathies is diabetes mellitus that is uncontrolled and high glucose levels. The cause of radiculoplexus neuropathy is unknown. However, it is thought to be caused by inflammation related to uncontrolled glucose levels. What are the signs or symptoms? Peripheral Neuropathy  Peripheral  neuropathy develops slowly over time. When the nerves of the feet and legs no longer work there may be:  Burning, stabbing, or aching pain in the legs or feet.  Inability to feel pressure or pain in your feet. This can lead to:  Thick calluses over pressure areas.  Pressure sores.  Ulcers.  Foot deformities.  Reduced ability to feel temperature changes.  Muscle weakness. Autonomic Neuropathy  The symptoms of autonomic neuropathy vary depending on which nerves are affected. Symptoms may include:  Problems with digestion, such as:  Feeling sick to your stomach (nausea).  Vomiting.  Bloating.  Constipation.  Diarrhea.  Abdominal pain.  Difficulty with urination. This occurs if you lose your ability to sense when your bladder is full. Problems include:  Urine leakage (incontinence).  Inability to empty your bladder completely (retention).  Rapid or irregular heartbeat (palpitations).  Blood pressure drops when you stand up (orthostatic hypotension). When you stand up you may feel:  Dizzy.  Weak.  Faint.  In men, inability to attain and maintain an erection.  In women, vaginal dryness and problems with decreased sexual desire and arousal.  Problems with body temperature regulation.  Increased or decreased sweating. Focal Neuropathy   Abnormal eye movements or abnormal alignment of both eyes.  Weakness in the wrist.  Foot drop. This results in an inability to lift the foot properly and abnormal walking or foot movement.  Paralysis on one side of your face (Bell palsy).  Chest or abdominal pain. Radiculoplexus Neuropathy   Sudden, severe pain in your hip, thigh, or buttocks.  Weakness and wasting of thigh muscles.  Difficulty rising from a seated position.  Abdominal swelling.  Unexplained weight loss (usually more than 10 lb [4.5 kg]). How  is this diagnosed? Peripheral Neuropathy  Your senses may be tested. Sensory function testing can be  done with:  A light touch using a monofilament.  A vibration with tuning fork.  A sharp sensation with a pin prick. Other tests that can help diagnose neuropathy are:  Nerve conduction velocity. This test checks the transmission of an electrical current through a nerve.  Electromyography. This shows how muscles respond to electrical signals transmitted by nearby nerves.  Quantitative sensory testing. This is used to assess how your nerves respond to vibrations and changes in temperature. Autonomic Neuropathy  Diagnosis is often based on reported symptoms. Tell your health care provider if you experience:  Dizziness.  Constipation.  Diarrhea.  Inappropriate urination or inability to urinate.  Inability to get or maintain an erection. Tests that may be done include:  Electrocardiography or Holter monitor. These are tests that can help show problems with the heart rate or heart rhythm.  An X-ray exam may be done. Focal Neuropathy  Diagnosis is made based on your symptoms and what your health care provider finds during your exam. Other tests may be done. They may include:  Nerve conduction velocities. This checks the transmission of electrical current through a nerve.  Electromyography. This shows how muscles respond to electrical signals transmitted by nearby nerves.  Quantitative sensory testing. This test is used to assess how your nerves respond to vibration and changes in temperature. Radiculoplexus Neuropathy   Often the first thing is to eliminate any other issue or problems that might be the cause, as there is no standard test for diagnosis.  X-ray exam of your spine and lumbar region.  Spinal tap to rule out cancer.  MRI to rule out other lesions. How is this treated? Once nerve damage occurs, it cannot be reversed. The goal of treatment is to keep the disease or nerve damage from getting worse and affecting more nerve fibers. Controlling your blood glucose level  is the key. Most people with radiculoplexus neuropathy see at least a partial improvement over time. You will need to keep your blood glucose and HbA1c levels in the target range determined by your health care provider. Things that help control blood glucose levels include:  Blood glucose monitoring.  Meal planning.  Physical activity.  Diabetes medicine. Over time, maintaining lower blood glucose levels helps lessen symptoms. Sometimes, prescription pain medicine is needed. Follow these instructions at home:  Do not smoke.  Keep your blood glucose level in the range that you and your health care provider have determined acceptable for you.  Keep your blood pressure level in the range that you and your health care provider have determined acceptable for you.  Eat a well-balanced diet.  Be physically active every day. Include strength training and balance exercises.  Protect your feet.  Check your feet every day for sores, cuts, blisters, or signs of infection.  Wear padded socks and supportive shoes. Use orthotic inserts, if necessary.  Regularly check the insides of your shoes for worn spots. Make sure there are no rocks or other items inside your shoes before you put them on. Contact a health care provider if:  You have burning, stabbing, or aching pain in the legs or feet.  You are unable to feel pressure or pain in your feet.  You develop problems with digestion such as:  Nausea.  Vomiting.  Bloating.  Constipation.  Diarrhea.  Abdominal pain.  You have difficulty with urination, such as:  Incontinence.  Retention.  You have palpitations.  You develop orthostatic hypotension. When you stand up you may feel:  Dizzy.  Weak.  Faint.  You cannot attain and maintain an erection (in men).  You have vaginal dryness and problems with decreased sexual desire and arousal (in women).  You have severe pain in your thighs, legs, or buttocks.  You have  unexplained weight loss. This information is not intended to replace advice given to you by your health care provider. Make sure you discuss any questions you have with your health care provider. Document Released: 03/08/2001 Document Revised: 06/05/2015 Document Reviewed: 06/08/2012 Elsevier Interactive Patient Education  2017 [place]. Diabetes and Foot Care Diabetes may cause you to have problems because of poor blood supply (circulation) to your feet and legs. This may cause the skin on your feet to become thinner, break easier, and heal more slowly. Your skin may become dry, and the skin may peel and crack. You may also have nerve damage in your legs and feet causing decreased feeling in them. You may not notice minor injuries to your feet that could lead to infections or more serious problems. Taking care of your feet is one of the most important things you can do for yourself. Follow these instructions at home:  Wear shoes at all times, even in the house. Do not go barefoot. Bare feet are easily injured.  Check your feet daily for blisters, cuts, and redness. If you cannot see the bottom of your feet, use a mirror or ask someone for help.  Wash your feet with warm water (do not use hot water) and mild soap. Then pat your feet and the areas between your toes until they are completely dry. Do not soak your feet as this can dry your skin.  Apply a moisturizing lotion or petroleum jelly (that does not contain alcohol and is unscented) to the skin on your feet and to dry, brittle toenails. Do not apply lotion between your toes.  Trim your toenails straight across. Do not dig under them or around the cuticle. File the edges of your nails with an emery board or nail file.  Do not cut corns or calluses or try to remove them with medicine.  Wear clean socks or stockings every day. Make sure they are not too tight. Do not wear knee-high stockings since they may decrease blood flow to your  legs.  Wear shoes that fit properly and have enough cushioning. To break in new shoes, wear them for just a few hours a day. This prevents you from injuring your feet. Always look in your shoes before you put them on to be sure there are no objects inside.  Do not cross your legs. This may decrease the blood flow to your feet.  If you find a minor scrape, cut, or break in the skin on your feet, keep it and the skin around it clean and dry. These areas may be cleansed with mild soap and water. Do not cleanse the area with peroxide, alcohol, or iodine.  When you remove an adhesive bandage, be sure not to damage the skin around it.  If you have a wound, look at it several times a day to make sure it is healing.  Do not use heating pads or hot water bottles. They may burn your skin. If you have lost feeling in your feet or legs, you may not know it is happening until it is too late.  Make sure your health care provider performs  a complete foot exam at least annually or more often if you have foot problems. Report any cuts, sores, or bruises to your health care provider immediately. Contact a health care provider if:  You have an injury that is not healing.  You have cuts or breaks in the skin.  You have an ingrown nail.  You notice redness on your legs or feet.  You feel burning or tingling in your legs or feet.  You have pain or cramps in your legs and feet.  Your legs or feet are numb.  Your feet always feel cold. Get help right away if:  There is increasing redness, swelling, or pain in or around a wound.  There is a red line that goes up your leg.  Pus is coming from a wound.  You develop a fever or as directed by your health care provider.  You notice a bad smell coming from an ulcer or wound. This information is not intended to replace advice given to you by your health care provider. Make sure you discuss any questions you have with your health care provider. Document  Released: 12/26/1999 Document Revised: 06/05/2015 Document Reviewed: 06/06/2012 Elsevier Interactive Patient Education  2017 [place]. Blood Glucose Monitoring, Adult Monitoring your blood sugar (glucose) helps you manage your diabetes. It also helps you and your health care provider determine how well your diabetes management plan is working. Blood glucose monitoring involves checking your blood glucose as often as directed, and keeping a record (log) of your results over time. Why should I monitor my blood glucose? Checking your blood glucose regularly can:  Help you understand how food, exercise, illnesses, and medicines affect your blood glucose.  Let you know what your blood glucose is at any time. You can quickly tell if you are having low blood glucose (hypoglycemia) or high blood glucose (hyperglycemia).  Help you and your health care provider adjust your medicines as needed. When should I check my blood glucose? Follow instructions from your health care provider about how often to check your blood glucose. This may depend on:  The type of diabetes you have.  How well-controlled your diabetes is.  Medicines you are taking. If you have type 1 diabetes:   Check your blood glucose at least 2 times a day.  Also check your blood glucose:  Before every insulin injection.  Before and after exercise.  Between meals.  2 hours after a meal.  Occasionally between 2:00 a.m. and 3:00 a.m., as directed.  Before potentially dangerous tasks, like driving or using heavy machinery.  At bedtime.  You may need to check your blood glucose more often, up to 6-10 times a day:  If you use an insulin pump.  If you need multiple daily injections (MDI).  If your diabetes is not well-controlled.  If you are ill.  If you have a history of severe hypoglycemia.  If you have a history of not knowing when your blood glucose is getting low (hypoglycemia unawareness). If you have type  2 diabetes:   If you take insulin or other diabetes medicines, check your blood glucose at least 2 times a day.  If you are on intensive insulin therapy, check your blood glucose at least 4 times a day. Occasionally, you may also need to check between 2:00 a.m. and 3:00 a.m., as directed.  Also check your blood glucose:  Before and after exercise.  Before potentially dangerous tasks, like driving or using heavy machinery.  You may need  to check your blood glucose more often if:  Your medicine is being adjusted.  Your diabetes is not well-controlled.  You are ill. What is a blood glucose log?  A blood glucose log is a record of your blood glucose readings. It helps you and your health care provider:  Look for patterns in your blood glucose over time.  Adjust your diabetes management plan as needed.  Every time you check your blood glucose, write down your result and notes about things that may be affecting your blood glucose, such as your diet and exercise for the day.  Most glucose meters store a record of glucose readings in the meter. Some meters allow you to download your records to a computer. How do I check my blood glucose? Follow these steps to get accurate readings of your blood glucose: Supplies needed    Blood glucose meter.  Test strips for your meter. Each meter has its own strips. You must use the strips that come with your meter.  A needle to prick your finger (lancet). Do not use lancets more than once.  A device that holds the lancet (lancing device).  A journal or log book to write down your results. Procedure   Wash your hands with soap and water.  Prick the side of your finger (not the tip) with the lancet. Use a different finger each time.  Gently rub the finger until a small drop of blood appears.  Follow instructions that come with your meter for inserting the test strip, applying blood to the strip, and using your blood glucose  meter.  Write down your result and any notes. Alternative testing sites   Some meters allow you to use areas of your body other than your finger (alternative sites) to test your blood.  If you think you may have hypoglycemia, or if you have hypoglycemia unawareness, do not use alternative sites. Use your finger instead.  Alternative sites may not be as accurate as the fingers, because blood flow is slower in these areas. This means that the result you get may be delayed, and it may be different from the result that you would get from your finger.  The most common alternative sites are:  Forearm.  Thigh.  Palm of the hand. Additional tips   Always keep your supplies with you.  If you have questions or need help, all blood glucose meters have a 24-hour "hotline" number that you can call. You may also contact your health care provider.  After you use a few boxes of test strips, adjust (calibrate) your blood glucose meter by following instructions that came with your meter. This information is not intended to replace advice given to you by your health care provider. Make sure you discuss any questions you have with your health care provider. Document Released: 12/31/2002 Document Revised: 07/18/2015 Document Reviewed: 06/09/2015 Elsevier Interactive Patient Education  2017 [employer].

## 2016-04-07 ENCOUNTER — Ambulatory Visit: Payer: Self-pay | Attending: Internal Medicine | Admitting: Pharmacist

## 2016-04-07 DIAGNOSIS — Z79899 Other long term (current) drug therapy: Secondary | ICD-10-CM | POA: Insufficient documentation

## 2016-04-07 DIAGNOSIS — E1165 Type 2 diabetes mellitus with hyperglycemia: Secondary | ICD-10-CM | POA: Insufficient documentation

## 2016-04-07 DIAGNOSIS — IMO0002 Reserved for concepts with insufficient information to code with codable children: Secondary | ICD-10-CM

## 2016-04-07 DIAGNOSIS — E1142 Type 2 diabetes mellitus with diabetic polyneuropathy: Secondary | ICD-10-CM | POA: Insufficient documentation

## 2016-04-07 MED ORDER — INSULIN GLARGINE 300 UNIT/ML ~~LOC~~ SOPN: 50.0000 [IU] | PEN_INJECTOR | Freq: Two times a day (BID) | SUBCUTANEOUS | 2 refills | Status: DC

## 2016-04-07 NOTE — Progress Notes (Signed)
    S:    Patient arrives in good spirits.  Presents for diabetes evaluation, education, and management at the request of Dr. Doreene Burke. Patient was referred on 11/25/15.  Last visit with Dr. Doreene Burke was 03/10/16. She is scheduled to see endocrinology next week.   Patient reports adherence with medications.  Current diabetes medications include: Toujeo 40 units BID, and regular insulin 30 units TID   Patient denies hypoglycemic events.  Patient reported dietary habits: she continues to vomit at least three times a day which makes eating difficult.  Patient reported exercise habits: she cannot exercise due to neuropathy.    Patient reports nocturia 3 times nightly.  Patient reports neuropathy and that it is worsening. Patient reports visual changes due to cataracts. Patient reports self foot exams.     O:  Lab Results  Component Value Date   HGBA1C >15.0 03/10/2016   There were no vitals filed for this visit.  Home fasting CBG: 180, 200s-400s 2 hour post-prandial/random CBG: 400-500s  A/P: Diabetes longstanding currently UNcontrolled based on A1c >15. Patient denies hypoglycemic events and is able to verbalize appropriate hypoglycemia management plan. Patient reports adherence with medication. I looked at pharmacy records and it appears that she has been late on some of her refills but   Increased Toujeo to 50 units BID and continue insulin regular. Stressed the importance of taking her insulin as getting her blood glucose down will help to improve her neuropathy and her gastroparesis. Patient to continue mirtazapine as the benefits likely have not started yet as it can take 6-8 weeks to be fully effective. Patient instructed to keep appointment with endocrinology next week.   Next A1C anticipated June 2018.    Written patient instructions provided.  Total time in face to face counseling 30 minutes.   Follow up with me as needed, next visit with endocrinology. Patient seen with Maryan Char, PharmD Candidate  .

## 2016-04-07 NOTE — Patient Instructions (Addendum)
Thanks for coming to see [name]!  Please keep your appointment with endocrinology next week  Increase Toujeo to 50 units twice daily.   Carbohydrate Counting for Diabetes Mellitus, Adult Carbohydrate counting is a method for keeping track of how many carbohydrates you eat. Eating carbohydrates naturally increases the amount of sugar (glucose) in the blood. Counting how many carbohydrates you eat helps keep your blood glucose within normal limits, which helps you manage your diabetes (diabetes mellitus). It is important to know how many carbohydrates you can safely have in each meal. This is different for every person. A diet and nutrition specialist (registered dietitian) can help you make a meal plan and calculate how many carbohydrates you should have at each meal and snack. Carbohydrates are found in the following foods:  Grains, such as breads and cereals.  Dried beans and soy products.  Starchy vegetables, such as potatoes, peas, and corn.  Fruit and fruit juices.  Milk and yogurt.  Sweets and snack foods, such as cake, cookies, candy, chips, and soft drinks. How do I count carbohydrates? There are two ways to count carbohydrates in food. You can use either of the methods or a combination of both. Reading "Nutrition Facts" on packaged food  The "Nutrition Facts" list is included on the labels of almost all packaged foods and beverages in the [place] It includes:  The serving size.  Information about nutrients in each serving, including the grams (g) of carbohydrate per serving. To use the "Nutrition Facts":  Decide how many servings you will have.  Multiply the number of servings by the number of carbohydrates per serving.  The resulting number is the total amount of carbohydrates that you will be having. Learning standard serving sizes of other foods  When you eat foods containing carbohydrates that are not packaged or do not include "Nutrition Facts" on the label, you need to  measure the servings in order to count the amount of carbohydrates:  Measure the foods that you will eat with a food scale or measuring cup, if needed.  Decide how many standard-size servings you will eat.  Multiply the number of servings by 15. Most carbohydrate-rich foods have about 15 g of carbohydrates per serving.  For example, if you eat 8 oz (170 g) of strawberries, you will have eaten 2 servings and 30 g of carbohydrates (2 servings x 15 g = 30 g).  For foods that have more than one food mixed, such as soups and casseroles, you must count the carbohydrates in each food that is included. The following list contains standard serving sizes of common carbohydrate-rich foods. Each of these servings has about 15 g of carbohydrates:   hamburger bun or  English muffin.   oz (15 mL) syrup.   oz (14 g) jelly.  1 slice of bread.  1 six-inch tortilla.  3 oz (85 g) cooked rice or pasta.  4 oz (113 g) cooked dried beans.  4 oz (113 g) starchy vegetable, such as peas, corn, or potatoes.  4 oz (113 g) hot cereal.  4 oz (113 g) mashed potatoes or  of a large baked potato.  4 oz (113 g) canned or frozen fruit.  4 oz (120 mL) fruit juice.  4-6 crackers.  6 chicken nuggets.  6 oz (170 g) unsweetened dry cereal.  6 oz (170 g) plain fat-free yogurt or yogurt sweetened with artificial sweeteners.  8 oz (240 mL) milk.  8 oz (170 g) fresh fruit or one small piece  of fruit.  24 oz (680 g) popped popcorn. Example of carbohydrate counting Sample meal   3 oz (85 g) chicken breast.  6 oz (170 g) brown rice.  4 oz (113 g) corn.  8 oz (240 mL) milk.  8 oz (170 g) strawberries with sugar-free whipped topping. Carbohydrate calculation  1. Identify the foods that contain carbohydrates:  Rice.  Corn.  Milk.  Strawberries. 2. Calculate how many servings you have of each food:  2 servings rice.  1 serving corn.  1 serving milk.  1 serving  strawberries. 3. Multiply each number of servings by 15 g:  2 servings rice x 15 g = 30 g.  1 serving corn x 15 g = 15 g.  1 serving milk x 15 g = 15 g.  1 serving strawberries x 15 g = 15 g. 4. Add together all of the amounts to find the total grams of carbohydrates eaten:  30 g + 15 g + 15 g + 15 g = 75 g of carbohydrates total. This information is not intended to replace advice given to you by your health care provider. Make sure you discuss any questions you have with your health care provider. Document Released: 12/28/2004 Document Revised: 07/18/2015 Document Reviewed: 06/11/2015 Elsevier Interactive Patient Education  2017 [employer].

## 2016-04-15 ENCOUNTER — Ambulatory Visit (INDEPENDENT_AMBULATORY_CARE_PROVIDER_SITE_OTHER): Payer: Self-pay | Admitting: Internal Medicine

## 2016-04-15 ENCOUNTER — Encounter: Payer: Self-pay | Admitting: Internal Medicine

## 2016-04-15 VITALS — BP 140/92 | HR 103 | Wt 173.0 lb

## 2016-04-15 DIAGNOSIS — E1165 Type 2 diabetes mellitus with hyperglycemia: Secondary | ICD-10-CM

## 2016-04-15 DIAGNOSIS — Z794 Long term (current) use of insulin: Secondary | ICD-10-CM

## 2016-04-15 DIAGNOSIS — IMO0002 Reserved for concepts with insufficient information to code with codable children: Secondary | ICD-10-CM

## 2016-04-15 DIAGNOSIS — E0822 Diabetes mellitus due to underlying condition with diabetic chronic kidney disease: Secondary | ICD-10-CM

## 2016-04-15 DIAGNOSIS — E1142 Type 2 diabetes mellitus with diabetic polyneuropathy: Secondary | ICD-10-CM

## 2016-04-15 DIAGNOSIS — E0865 Diabetes mellitus due to underlying condition with hyperglycemia: Secondary | ICD-10-CM

## 2016-04-15 DIAGNOSIS — N184 Chronic kidney disease, stage 4 (severe): Secondary | ICD-10-CM

## 2016-04-15 MED ORDER — INSULIN GLARGINE 300 UNIT/ML ~~LOC~~ SOPN: 50.0000 [IU] | PEN_INJECTOR | Freq: Two times a day (BID) | SUBCUTANEOUS | 5 refills | Status: DC

## 2016-04-15 NOTE — Addendum Note (Signed)
Addended by: Philemon Kingdom on: 04/15/2016 05:31 PM   Modules accepted: Level of Service

## 2016-04-15 NOTE — Progress Notes (Signed)
Subjective:     Patient ID: [name], female   DOB: 1970-07-16, 46 y.o.   MRN: [id number]  HPI Ms Munshi is a 46 y.o. woman returning for f/u for DM (unclear if 1 or 2), dx in 1996 when pregnant with her daughter, insulin-dependent since 1999, uncontrolled, with complications (peripheral neuropathy, gastroparesis, DR, CKD).   Last OV 8 mo ago.  She has severe gastroparesis, will need to have a pacemaker. She is having frequent nausea and vomiting. She mentions that she is taking her insulin as prescribed.  Last HbA1c: Lab Results  Component Value Date   HGBA1C >15.0 03/10/2016   HGBA1C >15 11/25/2015   HGBA1C 12.4 09/03/2014  07/24/2015: HbA1c 16%!  At last visit, Due to price, we changed to:  Insulin Before breakfast Before lunch Before dinner  Regular 20 20  25   NPH 40  30   Please add a R insulin sliding scale as follows:  - 150- 165: + 1 unit  - 166- 180: + 2 units  - 181- 195: + 3 units  - 196- 210: + 4 units  - 211- 225: + 5 units  - 226- 240: + 6 units  - 241- 255: + 7 units  - 256- 270: + 8 units  - 271- 285: + 9 units  - > 286: + 10 units  She is now on: - Lantus 30 units 2x a day - R insulin 40 units 3x a day  She checks her sugars 4x a day: - am:  234-250 >> low 200s >> 210-230 >> 250 >> 353-421 >> 280-421 - mid-morning: 90 (if skips b'fast) - 134 >> n/c - before lunch:  170s-200s, 354 x2 >> n/c  >> 350s >> 541, HI  >> 230-469 - before dinner: 198-230 >> 200-300 >> 260-300 >> 250 >> 462 >> 368-HI - before bedtime: 250-<300 >> n/c >> 400-HI >> 254 to HI >> 383-591  - No CKD, but had AKI at last admission. Last BUN/creatinine was:  Lab Results  Component Value Date   BUN 32 (H) 02/04/2016   Lab Results  Component Value Date   CREATININE 2.07 (H) 02/04/2016  07/24/2015: 46/2.27, GFR 28 She is on lisinopril. - Latest cholesterol level:  07/24/2015: 251/733/25/117 Lab Results  Component Value Date   CHOL 239 (H) 01/01/2010   HDL 33 (L) 01/01/2010    LDLCALC 176 (H) 01/01/2010   TRIG 149 01/01/2010   CHOLHDL 7.2 Ratio 01/01/2010   She is not on a statin. - No DR, last eye exem: 02/11/2016 >> + DR - + tingling/numbness in feet >> sees podiatry  She had multiple visits to the ED and admissions For hyperglycemia, gastroparesis exacerbation.  Review of Systems Constitutional: no weight gain/loss, + Fatigue, + nocturia Eyes: occasionally blurry vision, no xerophthalmia ENT:no sore throat, no nodules palpated in throat, no dysphagia/odynophagia, no hoarseness Cardiovascular: no CP/SOB/palpitations/swelling in hands and feet Respiratory: + cough/no SOB Gastrointestinal: + N/+ V/no D/C/+ heartburn Musculoskeletal: no muscle/joint aches Skin: no rashes Neurological: no tremors/numbness/tingling/dizziness, no HA  Objective:   Physical Exam BP (!) 140/92 (BP Location: Left Arm, Patient Position: Sitting)   Pulse (!) 103   Wt 173 lb (78.5 kg)   SpO2 99%   BMI 28.79 kg/m  Body mass index is 28.79 kg/m. Wt Readings from Last 3 Encounters:  04/15/16 173 lb (78.5 kg)  01/21/16 174 lb 3.2 oz (79 kg)  12/11/15 172 lb (78 kg)  Constitutional: overweight - abdominal, in NAD Eyes:  PERRLA, EOMI, no exophthalmos ENT: moist mucous membranes, no thyromegaly, no cervical lymphadenopathy Cardiovascular: RRR, No MRG Respiratory: CTA B Gastrointestinal: abdomen soft, NT, ND, BS+ Musculoskeletal: no deformities, strength intact in all 4 Skin: moist, warm, no rashes Neurological: no tremor with outstretched hands, DTR normal in all 4    Assessment:     1. DM - unclear If type I or type II, insulin-dependent, uncontrolled, with complications - CKD - peripheral neuropathy - DR - gastroparesis 10/06/2005: GES: Clinical Data: 46 year-old, gastroparesis.  NUCLEAR MEDICINE GASTRIC EMPTYING SCAN:  Technique: After oral ingestion of radiolabeled meal, sequential abdominal images were obtained for 120 minutes. Residual percentage of activity  remaining within the stomach was calculated at 60 and 120 minutes.  Radiopharmaceutical: 2 mCi Tc-3m sulfur colloid in egg meal.  Findings: Anterior imaging was performed over the stomach over a 2-hour period. At 60 minutes, close to 100% of the activity remains in the stomach, although there is some activity in the small bowel. At 2 hours, 91% of the activity remained in the stomach.  IMPRESSION:  Marked delayed solid phase gastric emptying consistent with gastroparesis.  Patient has been diagnosed with diabetes when she was 55, during her pregnancy, but it is unclear whether this is type I or type II. She has not have episodes of DKA, which would point more towards type 2 diabetes. I would like to check her C-peptide, however her sugars, and the C-peptide measurement is only valid if the glucose is lower than 200 at the time of the check.  Component     Latest Ref Rng 06/06/2014  Glutamic Acid Decarb Ab     0.0 - 5.0 U/mL <5.0  Pancreatic Islet Cell Antibody     Neg:<1:1 Negative   2. Diabetic peripheral neuropathy  Plan:     1. DM - Pt with poor DM control due to lack of bolusing/checking sugars and also her uncontrolled gastroparesis (" nothing works for this"), returning after another long absence. Reviewed last HbA1c from last month, which was undetectably high. - I again underlined compliance because such high sugars are usually seen in the absence of any treatment. She again mentions that she is taking her insulin, but she cannot eat well because of her gastroparesis. She still did not obtain the stomach pacemaker as a prerequisite is to get her sugars under control. There is a difficult situation in which the sugars are difficult to be controlled in the setting of gastroparesis and gastroparesis cannot be adequately treated if her sugars are so high. Also, I am not completely convinced about her compliance with her insulin especially since she is noncompliant with appointments and  she brings me a sugar log on her phone for 1 week dating back in 12/2015... - She is still out of work and cost of medicines is an issue, however, she now can afford the Lantus and the regular insulin.  - We cannot add any other medicines due to her GI problems. Therefore, our only option is to increase her insulin. - We also discussed about diet:  she tells me that she tried everything and she is still vomiting. However, she is telling me that she is eating bacon which would be the last thing that she should eat. I asked her if she would prefer to be referred to a nutritionist for more help with her diet, but she tells me that she has a book at home with food stay she can eat but she does not find  that helpful. - I advised her to: Patient Instructions  Please change: - Lantus to Toujeo and increase the dose to 50 units 2x a day  Please continue:   - R insulin 40 units 3x a day after a meal  Can try the following combination for neuropathy: - alpha-lipoic acid 600 mg twice a day - B complex 1 tablet once a day  Please return in 3 months with your sugar log.  -  She was on an insulin pump which she liked and her DM was better controlled then, but lost her insurance. I did give her brochures at last visit about insulin pumps but she did not decide about this. However now, cost is definitely an issue. I also would like to see more compliance with her visits before agreeing with an insulin pump. - Return in about 3 months (around 07/15/2016).   2. Diabetic peripheral neuropathy - I advised her to start alpha lipoic acid and b complex - We absolutely need to start getting her diabetes under control  before her neuropathy becomes more manageable  - time spent with the patient: 40 min, of which >50% was spent in obtaining information about her symptoms, reviewing her previous labs, evaluations, and treatments, counseling her about her condition (please see the discussed topics above), and developing  a plan to further investigate it; she had a number of questions which I addressed.  Philemon Kingdom, MD PhD [facility]

## 2016-04-15 NOTE — Patient Instructions (Addendum)
Please change: - Lantus to Toujeo and increase the dose to 50 units 2x a day  Please continue: - R insulin 40 units 3x a day after a meal  Can try the following combination for neuropathy: - alpha-lipoic acid 600 mg twice a day - B complex 1 tablet once a day  Please return in 3 months with your sugar log.

## 2016-04-29 ENCOUNTER — Encounter (HOSPITAL_COMMUNITY): Payer: Self-pay | Admitting: Emergency Medicine

## 2016-04-29 ENCOUNTER — Emergency Department (HOSPITAL_COMMUNITY)
Admission: EM | Admit: 2016-04-29 | Discharge: 2016-04-30 | Disposition: A | Discharge status: Home / Self Care | Payer: Self-pay | Attending: Emergency Medicine | Admitting: Emergency Medicine

## 2016-04-29 DIAGNOSIS — E1142 Type 2 diabetes mellitus with diabetic polyneuropathy: Secondary | ICD-10-CM | POA: Insufficient documentation

## 2016-04-29 DIAGNOSIS — E875 Hyperkalemia: Secondary | ICD-10-CM | POA: Insufficient documentation

## 2016-04-29 DIAGNOSIS — E1165 Type 2 diabetes mellitus with hyperglycemia: Secondary | ICD-10-CM | POA: Insufficient documentation

## 2016-04-29 DIAGNOSIS — Z794 Long term (current) use of insulin: Secondary | ICD-10-CM | POA: Insufficient documentation

## 2016-04-29 DIAGNOSIS — J45909 Unspecified asthma, uncomplicated: Secondary | ICD-10-CM | POA: Insufficient documentation

## 2016-04-29 DIAGNOSIS — F17210 Nicotine dependence, cigarettes, uncomplicated: Secondary | ICD-10-CM | POA: Insufficient documentation

## 2016-04-29 DIAGNOSIS — Z79899 Other long term (current) drug therapy: Secondary | ICD-10-CM | POA: Insufficient documentation

## 2016-04-29 DIAGNOSIS — I10 Essential (primary) hypertension: Secondary | ICD-10-CM | POA: Insufficient documentation

## 2016-04-29 DIAGNOSIS — R739 Hyperglycemia, unspecified: Secondary | ICD-10-CM

## 2016-04-29 LAB — BASIC METABOLIC PANEL
Anion gap: 11 (ref 5–15)
BUN: 50 mg/dL — ABNORMAL HIGH (ref 6–20)
CHLORIDE: 95 mmol/L — AB (ref 101–111)
CO2: 23 mmol/L (ref 22–32)
Calcium: 9.2 mg/dL (ref 8.9–10.3)
Creatinine, Ser: 2.76 mg/dL — ABNORMAL HIGH (ref 0.44–1.00)
GFR, EST AFRICAN AMERICAN: 23 mL/min — AB (ref >60)
GFR, EST NON AFRICAN AMERICAN: 20 mL/min — AB (ref >60)
Glucose, Bld: 521 mg/dL (ref 65–99)
Potassium: 5.4 mmol/L — ABNORMAL HIGH (ref 3.5–5.1)
SODIUM: 129 mmol/L — AB (ref 135–145)

## 2016-04-29 LAB — URINALYSIS, ROUTINE W REFLEX MICROSCOPIC
BILIRUBIN URINE: NEGATIVE
HGB URINE DIPSTICK: NEGATIVE
KETONES UR: NEGATIVE mg/dL
LEUKOCYTES UA: NEGATIVE
NITRITE: NEGATIVE
PH: 6 (ref 5.0–8.0)
Protein, ur: 100 mg/dL — AB
SPECIFIC GRAVITY, URINE: 1.015 (ref 1.005–1.030)

## 2016-04-29 LAB — CBG MONITORING, ED
GLUCOSE-CAPILLARY: 504 mg/dL — AB (ref 65–99)
Glucose-Capillary: 386 mg/dL — ABNORMAL HIGH (ref 65–99)
Glucose-Capillary: 280 mg/dL — ABNORMAL HIGH (ref 65–99)

## 2016-04-29 LAB — CBC
HCT: 30.2 % — ABNORMAL LOW (ref 36.0–46.0)
Hemoglobin: 9.6 g/dL — ABNORMAL LOW (ref 12.0–15.0)
MCH: 24.8 pg — ABNORMAL LOW (ref 26.0–34.0)
MCHC: 31.8 g/dL (ref 30.0–36.0)
MCV: 78 fL (ref 78.0–100.0)
PLATELETS: 460 10*3/uL — AB (ref 150–400)
RBC: 3.87 MIL/uL (ref 3.87–5.11)
RDW: 12.5 % (ref 11.5–15.5)
WBC: 10.2 10*3/uL (ref 4.0–10.5)

## 2016-04-29 MED ORDER — INSULIN ASPART 100 UNIT/ML ~~LOC~~ SOLN
5.0000 [IU] | Freq: Once | SUBCUTANEOUS | Status: AC
Start: 2016-04-29 — End: 2016-04-29
  Administered 2016-04-29: 5 [IU] via INTRAVENOUS

## 2016-04-29 MED ORDER — SODIUM CHLORIDE 0.9 % IV BOLUS (SEPSIS)
1000.0000 mL | Freq: Once | INTRAVENOUS | Status: AC
  Administered 2016-04-29: 1000 mL via INTRAVENOUS

## 2016-04-29 MED ORDER — INSULIN ASPART 100 UNIT/ML ~~LOC~~ SOLN
10.0000 [IU] | Freq: Once | SUBCUTANEOUS | Status: DC
  Filled 2016-04-29: qty 1

## 2016-04-29 MED ORDER — SODIUM POLYSTYRENE SULFONATE 15 GM/60ML PO SUSP
30.0000 g | Freq: Once | ORAL | Status: AC
  Administered 2016-04-29: 30 g via ORAL
  Filled 2016-04-29: qty 120

## 2016-04-29 NOTE — ED Notes (Signed)
Dr. Leonette Monarch notified on pt. elevated blood glucose results.

## 2016-04-29 NOTE — ED Notes (Signed)
Pt unable to tolerate kaexalate.

## 2016-04-29 NOTE — ED Provider Notes (Signed)
[facility] DEPT Provider Note   CSN: [id number] Arrival date & time: 04/29/16  1836     History   Chief Complaint Chief Complaint  Patient presents with  . Hyperglycemia  . Foot Pain    HPI Susan Horton is a 46 y.o. female.  The history is provided by the patient.  Hyperglycemia  Blood sugar level PTA:  480 Severity:  Moderate Onset quality:  Gradual Duration:  1 week Timing:  Constant Progression:  Waxing and waning Chronicity:  Recurrent Diabetes status:  Controlled with insulin Context: not noncompliance   Relieved by:  Nothing Ineffective treatments:  Insulin Associated symptoms: abdominal pain and vomiting (daily; baseline for 1 yr)   Foot Pain  This is a chronic (due to neuropathy; bilaterally) problem. The problem has been gradually worsening. Associated symptoms include abdominal pain.    Past Medical History:  Diagnosis Date  . Asthma    prn inhaler  . Diabetes mellitus    IDDM  . DVT (deep venous thrombosis) ([place])    20+ yrs ago (during pregnancy), 2015  . Essential hypertension, benign 12/11/2015  . GERD (gastroesophageal reflux disease)   . Headache(784.0)    migraines  . Hidradenitis     Patient Active Problem List   Diagnosis Date Noted  . Diabetes mellitus due to underlying condition, uncontrolled, with stage 4 chronic kidney disease, with long-term current use of insulin ([place]) 04/15/2016  . Neuropathy 03/10/2016  . Essential hypertension, benign 12/11/2015  . Diabetic nephropathy associated with type 2 diabetes mellitus ([place]) 12/11/2015  . Diabetic gastroparesis ([place])   . Chest pain 06/05/2014  . Intractable nausea and vomiting 06/05/2014  . Uncontrolled type 2 diabetes mellitus with peripheral neuropathy ([place]) 06/05/2014  . Normocytic anemia 06/05/2014  . Hyperglycemia   . Monilial vaginitis 07/10/2010    Past Surgical History:  Procedure Laterality Date  . APPENDECTOMY  1995  . BREAST REDUCTION SURGERY  2002  .  CHOLECYSTECTOMY  1993  . EXPLORATORY LAPAROTOMY  08/14/2005   lysis of adhesions, drainage of tubo-ovarian abscess  . HYDRADENITIS EXCISION  02/08/2011   Procedure: EXCISION HYDRADENITIS GROIN;  Surgeon: [name], MD;  Location: [facility];  Service: General;  Laterality: N/A;  Excisioin of Hidradenitis Left groin  . INGUINAL HIDRADENITIS EXCISION  07/06/2010   bilateral  . TONSILLECTOMY    . TUBAL LIGATION  1996  . tummy tuck    . VAGINAL HYSTERECTOMY  08/04/2005   and cysto    OB History    No data available       Home Medications    Prior to Admission medications   Medication Sig Start Date End Date Taking? Authorizing Provider  albuterol (PROVENTIL HFA;VENTOLIN HFA) 108 (90 BASE) MCG/ACT inhaler Inhale 1 puff into the lungs every 6 (six) hours as needed for wheezing or shortness of breath.   Yes Historical Provider, MD  amLODipine (NORVASC) 10 MG tablet Take 1 tablet (10 mg total) by mouth daily. 01/21/16  Yes [name], MD  Capsaicin-Menthol-Methyl Sal (CAPSAICIN-METHYL SAL-MENTHOL) 0.025-1-12 % CREA Apply 1 application topically 4 (four) times daily as needed. For aches/pains, apply to skin. May need gloves. Patient taking differently: Apply 1 application topically 4 (four) times daily as needed (for aches or pains (may need gloves)).  02/24/16  Yes Maren Reamer, MD  Ferrous Sulfate (IRON) 325 (65 Fe) MG TABS Take 325 mg by mouth daily. 11/25/15  Yes Tiffany Daneil Dan, PA-C  Fluticasone-Salmeterol (ADVAIR) 250-50 MCG/DOSE AEPB  Inhale 1 puff into the lungs 2 (two) times daily as needed (FOR SHORTNESS OF BREATH).    Yes Historical Provider, MD  gabapentin (NEURONTIN) 300 MG capsule Take 2 capsules (600 mg total) by mouth 3 (three) times daily. 03/10/16  Yes [name], MD  Insulin Glargine (TOUJEO SOLOSTAR) 300 UNIT/ML SOPN Inject 50 Units into the skin 2 (two) times daily. Patient taking differently: Inject 50 Units into the skin 2 (two) times  daily after a meal.  04/15/16  Yes Philemon Kingdom, MD  insulin regular (NOVOLIN R,HUMULIN R) 250 units/2.64mL (100 units/mL) injection Inject 0.3 mLs (30 Units total) into the skin 3 (three) times daily before meals. Sliding scale 02/25/16  Yes [name], MD  Insulin Syringe 27G X 1/2" 0.5 ML MISC 1 Syringe by Does not apply route 5 (five) times daily. 11/20/15  Yes Orlie Dakin, MD  losartan (COZAAR) 100 MG tablet Take 1 tablet (100 mg total) by mouth daily. 01/21/16  Yes [name], MD  lovastatin (MEVACOR) 20 MG tablet Take 20 mg by mouth daily.   Yes Historical Provider, MD  metoCLOPramide (REGLAN) 5 MG tablet Take 1 tablet (5 mg total) by mouth 4 (four) times daily -  before meals and at bedtime. 01/21/16  Yes [name], MD  metoprolol tartrate (LOPRESSOR) 25 MG tablet Take 1 tablet (25 mg total) by mouth 2 (two) times daily. 01/21/16  Yes [name], MD  mirtazapine (REMERON SOL-TAB) 15 MG disintegrating tablet Take 1 tablet (15 mg total) by mouth at bedtime. 02/18/16  Yes [name], MD  montelukast (SINGULAIR) 10 MG tablet Take 10 mg by mouth daily as needed (allergies).    Yes Historical Provider, MD  omeprazole (PRILOSEC) 40 MG capsule Take 40 mg by mouth at bedtime.    Yes Historical Provider, MD    Family History Family History  Problem Relation Age of Onset  . Hypertension Father   . Hypertension Sister   . Hypertension Brother     Social History Social History  Substance Use Topics  . Smoking status: Current Some Day Smoker    Packs/day: 0.50    Years: 20.00    Types: Cigarettes  . Smokeless tobacco: Never Used     Comment: 1 pack/week  . Alcohol use No     Allergies   Penicillin g; Penicillins; and Lisinopril   Review of Systems Review of Systems  Gastrointestinal: Positive for abdominal pain and vomiting (daily; baseline for 1 yr).  All other systems are reviewed and are negative for acute change except as noted in the  HPI    Physical Exam Updated Vital Signs BP (!) 178/96 (BP Location: Right Arm)   Pulse (!) 110   Temp 98.6 F (37 C) (Oral)   Resp 18   SpO2 100%   Physical Exam  Constitutional: She is oriented to person, place, and time. She appears well-developed and well-nourished. No distress.  HENT:  Head: Normocephalic and atraumatic.  Nose: Nose normal.  Eyes: Conjunctivae and EOM are normal. Pupils are equal, round, and reactive to light. Right eye exhibits no discharge. Left eye exhibits no discharge. No scleral icterus.  Neck: Normal range of motion. Neck supple.  Cardiovascular: Normal rate and regular rhythm.  Exam reveals no gallop and no friction rub.   No murmur heard. Pulmonary/Chest: Effort normal and breath sounds normal. No stridor. No respiratory distress. She has no rales.  Abdominal: Soft. She exhibits no distension. There is no tenderness.  Musculoskeletal: She exhibits no edema.       Right lower leg: She exhibits tenderness. She exhibits no swelling.       Left lower leg: She exhibits tenderness. She exhibits no swelling.       Right foot: There is tenderness. There is no swelling.       Left foot: There is tenderness. There is no swelling.  Neurological: She is alert and oriented to person, place, and time.  Skin: Skin is warm and dry. No rash noted. She is not diaphoretic. No erythema.  Psychiatric: She has a normal mood and affect.  Vitals reviewed.    ED Treatments / Results  Labs (all labs ordered are listed, but only abnormal results are displayed) Labs Reviewed  BASIC METABOLIC PANEL - Abnormal; Notable for the following:       Result Value   Sodium 129 (*)    Potassium 5.4 (*)    Chloride 95 (*)    Glucose, Bld 521 (*)    BUN 50 (*)    Creatinine, Ser 2.76 (*)    GFR calc non Af Amer 20 (*)    GFR calc Af Amer 23 (*)    All other components within normal limits  CBC - Abnormal; Notable for the following:    Hemoglobin 9.6 (*)    HCT 30.2 (*)     MCH 24.8 (*)    Platelets 460 (*)    All other components within normal limits  URINALYSIS, ROUTINE W REFLEX MICROSCOPIC - Abnormal; Notable for the following:    Color, Urine STRAW (*)    Glucose, UA >=500 (*)    Protein, ur 100 (*)    Bacteria, UA RARE (*)    Squamous Epithelial / LPF 0-5 (*)    All other components within normal limits  CBG MONITORING, ED - Abnormal; Notable for the following:    Glucose-Capillary 504 (*)    All other components within normal limits  CBG MONITORING, ED - Abnormal; Notable for the following:    Glucose-Capillary 386 (*)    All other components within normal limits  CBG MONITORING, ED - Abnormal; Notable for the following:    Glucose-Capillary 280 (*)    All other components within normal limits  URINALYSIS, ROUTINE W REFLEX MICROSCOPIC  I-STAT CHEM 8, ED    EKG  EKG Interpretation  Date/Time:  Thursday April 29 2016 23:11:24 EDT Ventricular Rate:  98 PR Interval:    QRS Duration: 87 QT Interval:  344 QTC Calculation: 440 R Axis:   59 Text Interpretation:  Sinus rhythm Nonspecific T abnormalities, lateral leads no peadked T waves No STEMI  No significant change since last tracing Confirmed by [facility] MD, PEDRO ([id number]) on 04/29/2016 11:19:04 PM       Radiology No results found.  Procedures Procedures (including critical care time)  Medications Ordered in ED Medications  sodium chloride 0.9 % bolus 1,000 mL (0 mLs Intravenous Stopped 04/29/16 2210)  sodium polystyrene (KAYEXALATE) 15 GM/60ML suspension 30 g (30 g Oral Given 04/29/16 2207)  insulin aspart (novoLOG) injection 5 Units (5 Units Intravenous Given 04/29/16 2206)     Initial Impression / Assessment and Plan / ED Course  I have reviewed the triage vital signs and the nursing notes.  Pertinent labs & imaging results that were available during my care of the patient were reviewed by me and considered in my medical decision making (see chart for details).     1.  Hyperglycemia No evidence  of DKA. Pseudo-hyponatremia secondary to hyper glycemia. She does have mild hyperkalemia. EKG without peaked T waves or dysrhythmias. Patient provided with IV fluids and IV insulin. By mouth Kayexalate.  Improved hyperglycemia with IV fluids and 5 units of insulin. Chem-8 repeated and is currently pending. If K+ improve, pt should be safe for discharge with close PCP f/u which she already has scheduled.  2. Foot pain Consistent with patient's neuropathy. No swelling or edema concerning for DVTs.   Patient care turned over to Dr Gilford Raid at [facility]. Patient case and results discussed in detail; please see their note for further ED managment.     Final Clinical Impressions(s) / ED Diagnoses   Final diagnoses:  Hyperglycemia  Hyperkalemia  Diabetic peripheral neuropathy associated with type 2 diabetes mellitus ([place])      Fatima Blank, MD 04/30/16 [phone]

## 2016-04-29 NOTE — ED Triage Notes (Signed)
Pt sts hyperglycemia and some leg and foot pain

## 2016-04-30 LAB — I-STAT CHEM 8, ED
BUN: 61 mg/dL — AB (ref 6–20)
CHLORIDE: 103 mmol/L (ref 101–111)
CREATININE: 2.6 mg/dL — AB (ref 0.44–1.00)
Calcium, Ion: 1.2 mmol/L (ref 1.15–1.40)
GLUCOSE: 306 mg/dL — AB (ref 65–99)
HCT: 25 % — ABNORMAL LOW (ref 36.0–46.0)
Hemoglobin: 8.5 g/dL — ABNORMAL LOW (ref 12.0–15.0)
POTASSIUM: 5 mmol/L (ref 3.5–5.1)
Sodium: 135 mmol/L (ref 135–145)
TCO2: 25 mmol/L (ref 0–100)

## 2016-04-30 NOTE — ED Provider Notes (Signed)
Pt's potassium and blood sugar has improved.  She wants to go home and is stable for d/c.  She knows to return if worse and to f/u with her pcp.   Isla Pence, MD 04/30/16 [phone]

## 2016-05-05 ENCOUNTER — Telehealth: Payer: Self-pay | Admitting: Internal Medicine

## 2016-05-05 NOTE — Telephone Encounter (Signed)
PT called requesting a referral for a neurology since has issue with her legs, this was advice by the ED, please follow up with patient

## 2016-05-07 NOTE — Telephone Encounter (Signed)
Is this request appropriate or will patient need OV?

## 2016-05-17 ENCOUNTER — Encounter: Payer: Self-pay | Admitting: Neurology

## 2016-05-17 ENCOUNTER — Other Ambulatory Visit: Payer: Self-pay | Admitting: Internal Medicine

## 2016-05-17 DIAGNOSIS — E1140 Type 2 diabetes mellitus with diabetic neuropathy, unspecified: Secondary | ICD-10-CM

## 2016-05-17 NOTE — Telephone Encounter (Signed)
Yes, ordered. Thanks!

## 2016-05-24 ENCOUNTER — Encounter (INDEPENDENT_AMBULATORY_CARE_PROVIDER_SITE_OTHER): Payer: Self-pay | Admitting: Ophthalmology

## 2016-05-25 ENCOUNTER — Telehealth: Payer: Self-pay | Admitting: Cardiology

## 2016-05-25 NOTE — Telephone Encounter (Signed)
NOTES FAXED TO [facility] °

## 2016-05-26 ENCOUNTER — Telehealth: Payer: Self-pay | Admitting: Cardiology

## 2016-05-26 ENCOUNTER — Encounter: Payer: Self-pay | Admitting: Internal Medicine

## 2016-05-26 ENCOUNTER — Other Ambulatory Visit: Payer: Self-pay | Admitting: Internal Medicine

## 2016-05-26 ENCOUNTER — Ambulatory Visit: Payer: Medicaid Other | Attending: Internal Medicine | Admitting: Internal Medicine

## 2016-05-26 VITALS — BP 127/73 | HR 102 | Temp 98.9°F | Resp 18 | Ht 65.0 in | Wt 171.0 lb

## 2016-05-26 DIAGNOSIS — E1121 Type 2 diabetes mellitus with diabetic nephropathy: Secondary | ICD-10-CM

## 2016-05-26 DIAGNOSIS — N183 Chronic kidney disease, stage 3 (moderate): Secondary | ICD-10-CM | POA: Diagnosis not present

## 2016-05-26 DIAGNOSIS — Z79899 Other long term (current) drug therapy: Secondary | ICD-10-CM | POA: Diagnosis not present

## 2016-05-26 DIAGNOSIS — E1142 Type 2 diabetes mellitus with diabetic polyneuropathy: Secondary | ICD-10-CM | POA: Diagnosis present

## 2016-05-26 DIAGNOSIS — I129 Hypertensive chronic kidney disease with stage 1 through stage 4 chronic kidney disease, or unspecified chronic kidney disease: Secondary | ICD-10-CM | POA: Insufficient documentation

## 2016-05-26 DIAGNOSIS — I10 Essential (primary) hypertension: Secondary | ICD-10-CM

## 2016-05-26 DIAGNOSIS — E1165 Type 2 diabetes mellitus with hyperglycemia: Secondary | ICD-10-CM | POA: Diagnosis present

## 2016-05-26 DIAGNOSIS — E1122 Type 2 diabetes mellitus with diabetic chronic kidney disease: Secondary | ICD-10-CM | POA: Insufficient documentation

## 2016-05-26 DIAGNOSIS — IMO0002 Reserved for concepts with insufficient information to code with codable children: Secondary | ICD-10-CM

## 2016-05-26 DIAGNOSIS — F4321 Adjustment disorder with depressed mood: Secondary | ICD-10-CM | POA: Diagnosis not present

## 2016-05-26 DIAGNOSIS — Z794 Long term (current) use of insulin: Secondary | ICD-10-CM | POA: Diagnosis not present

## 2016-05-26 DIAGNOSIS — G629 Polyneuropathy, unspecified: Secondary | ICD-10-CM

## 2016-05-26 LAB — POCT GLYCOSYLATED HEMOGLOBIN (HGB A1C): HEMOGLOBIN A1C: 14.5

## 2016-05-26 LAB — GLUCOSE, POCT (MANUAL RESULT ENTRY): POC GLUCOSE: 481 mg/dL — AB (ref 70–99)

## 2016-05-26 MED ORDER — DULOXETINE HCL 30 MG PO CPEP: 30.0000 mg | ORAL_CAPSULE | Freq: Every day | ORAL | 3 refills | Status: DC

## 2016-05-26 MED ORDER — GLIPIZIDE 10 MG PO TABS: 10.0000 mg | ORAL_TABLET | Freq: Two times a day (BID) | ORAL | 3 refills | Status: DC

## 2016-05-26 MED FILL — MIRTAZAPINE 15 MG ODT: 15 | 30 days supply | Qty: 30 | Fill #1

## 2016-05-26 MED FILL — ?AMLODIPINE BESYLATE 10 MG: 10 | 30 days supply | Qty: 30 | Fill #1

## 2016-05-26 MED FILL — ?GLIPIZIDE 10 MG TABLET: 10 | 30 days supply | Qty: 60 | Fill #0

## 2016-05-26 MED FILL — METOPROLOL TARTRATE 25 MG T: 25 | 30 days supply | Qty: 60 | Fill #1

## 2016-05-26 MED FILL — LOSARTAN POTASSIUM 100 MG T: 100 | 30 days supply | Qty: 30 | Fill #1

## 2016-05-26 MED FILL — ?DULOXETINE HCL DR 30 MG CA: 30 MG | 30 days supply | Qty: 30 | Fill #0

## 2016-05-26 MED FILL — CALCITRIOL 0.25 MCG CAPSULE: 0.25 | 31 days supply | Qty: 31 | Fill #1

## 2016-05-26 NOTE — Telephone Encounter (Signed)
Received records from [facility] for appointment on 06/15/16 with Kerin Ransom, [place].  Records put with Luke's schedule for 06/15/16. lp

## 2016-05-26 NOTE — Progress Notes (Signed)
Patient is here for DM  Patient complains of bilateral leg pain caused by neuropathy.  Patient has taken medication today. Patient has eaten today.

## 2016-05-26 NOTE — Patient Instructions (Signed)
Diabetes Mellitus and Exercise Exercising regularly is important for your overall health, especially when you have diabetes (diabetes mellitus). Exercising is not only about losing weight. It has many health benefits, such as increasing muscle strength and bone density and reducing body fat and stress. This leads to improved fitness, flexibility, and endurance, all of which result in better overall health. Exercise has additional benefits for people with diabetes, including:  Reducing appetite.  Helping to lower and control blood glucose.  Lowering blood pressure.  Helping to control amounts of fatty substances (lipids) in the blood, such as cholesterol and triglycerides.  Helping the body to respond better to insulin (improving insulin sensitivity).  Reducing how much insulin the body needs.  Decreasing the risk for heart disease by:  Lowering cholesterol and triglyceride levels.  Increasing the levels of good cholesterol.  Lowering blood glucose levels. What is my activity plan? Your health care provider or certified diabetes educator can help you make a plan for the type and frequency of exercise (activity plan) that works for you. Make sure that you:  Do at least 150 minutes of moderate-intensity or vigorous-intensity exercise each week. This could be brisk walking, biking, or water aerobics.  Do stretching and strength exercises, such as yoga or weightlifting, at least 2 times a week.  Spread out your activity over at least 3 days of the week.  Get some form of physical activity every day.  Do not go more than 2 days in a row without some kind of physical activity.  Avoid being inactive for more than 90 minutes at a time. Take frequent breaks to walk or stretch.  Choose a type of exercise or activity that you enjoy, and set realistic goals.  Start slowly, and gradually increase the intensity of your exercise over time. What do I need to know about managing my  diabetes?  Check your blood glucose before and after exercising.  If your blood glucose is higher than 240 mg/dL (13.3 mmol/L) before you exercise, check your urine for ketones. If you have ketones in your urine, do not exercise until your blood glucose returns to normal.  Know the symptoms of low blood glucose (hypoglycemia) and how to treat it. Your risk for hypoglycemia increases during and after exercise. Common symptoms of hypoglycemia can include:  Hunger.  Anxiety.  Sweating and feeling clammy.  Confusion.  Dizziness or feeling light-headed.  Increased heart rate or palpitations.  Blurry vision.  Tingling or numbness around the mouth, lips, or tongue.  Tremors or shakes.  Irritability.  Keep a rapid-acting carbohydrate snack available before, during, and after exercise to help prevent or treat hypoglycemia.  Avoid injecting insulin into areas of the body that are going to be exercised. For example, avoid injecting insulin into:  The arms, when playing tennis.  The legs, when jogging.  Keep records of your exercise habits. Doing this can help you and your health care provider adjust your diabetes management plan as needed. Write down:  Food that you eat before and after you exercise.  Blood glucose levels before and after you exercise.  The type and amount of exercise you have done.  When your insulin is expected to peak, if you use insulin. Avoid exercising at times when your insulin is peaking.  When you start a new exercise or activity, work with your health care provider to make sure the activity is safe for you, and to adjust your insulin, medicines, or food intake as needed.  Drink plenty   of water while you exercise to prevent dehydration or heat stroke. Drink enough fluid to keep your urine clear or pale yellow. This information is not intended to replace advice given to you by your health care provider. Make sure you discuss any questions you have with  your health care provider. Document Released: 03/20/2003 Document Revised: 07/18/2015 Document Reviewed: 06/09/2015 Elsevier Interactive Patient Education  2017 [place]. Diabetes Mellitus and Exercise Exercising regularly is important for your overall health, especially when you have diabetes (diabetes mellitus). Exercising is not only about losing weight. It has many health benefits, such as increasing muscle strength and bone density and reducing body fat and stress. This leads to improved fitness, flexibility, and endurance, all of which result in better overall health. Exercise has additional benefits for people with diabetes, including:  Reducing appetite.  Helping to lower and control blood glucose.  Lowering blood pressure.  Helping to control amounts of fatty substances (lipids) in the blood, such as cholesterol and triglycerides.  Helping the body to respond better to insulin (improving insulin sensitivity).  Reducing how much insulin the body needs.  Decreasing the risk for heart disease by:  Lowering cholesterol and triglyceride levels.  Increasing the levels of good cholesterol.  Lowering blood glucose levels. What is my activity plan? Your health care provider or certified diabetes educator can help you make a plan for the type and frequency of exercise (activity plan) that works for you. Make sure that you:  Do at least 150 minutes of moderate-intensity or vigorous-intensity exercise each week. This could be brisk walking, biking, or water aerobics.  Do stretching and strength exercises, such as yoga or weightlifting, at least 2 times a week.  Spread out your activity over at least 3 days of the week.  Get some form of physical activity every day.  Do not go more than 2 days in a row without some kind of physical activity.  Avoid being inactive for more than 90 minutes at a time. Take frequent breaks to walk or stretch.  Choose a type of exercise or  activity that you enjoy, and set realistic goals.  Start slowly, and gradually increase the intensity of your exercise over time. What do I need to know about managing my diabetes?  Check your blood glucose before and after exercising.  If your blood glucose is higher than 240 mg/dL (13.3 mmol/L) before you exercise, check your urine for ketones. If you have ketones in your urine, do not exercise until your blood glucose returns to normal.  Know the symptoms of low blood glucose (hypoglycemia) and how to treat it. Your risk for hypoglycemia increases during and after exercise. Common symptoms of hypoglycemia can include:  Hunger.  Anxiety.  Sweating and feeling clammy.  Confusion.  Dizziness or feeling light-headed.  Increased heart rate or palpitations.  Blurry vision.  Tingling or numbness around the mouth, lips, or tongue.  Tremors or shakes.  Irritability.  Keep a rapid-acting carbohydrate snack available before, during, and after exercise to help prevent or treat hypoglycemia.  Avoid injecting insulin into areas of the body that are going to be exercised. For example, avoid injecting insulin into:  The arms, when playing tennis.  The legs, when jogging.  Keep records of your exercise habits. Doing this can help you and your health care provider adjust your diabetes management plan as needed. Write down:  Food that you eat before and after you exercise.  Blood glucose levels before and after you exercise.  The type and amount of exercise you have done.  When your insulin is expected to peak, if you use insulin. Avoid exercising at times when your insulin is peaking.  When you start a new exercise or activity, work with your health care provider to make sure the activity is safe for you, and to adjust your insulin, medicines, or food intake as needed.  Drink plenty of water while you exercise to prevent dehydration or heat stroke. Drink enough fluid to keep your  urine clear or pale yellow. This information is not intended to replace advice given to you by your health care provider. Make sure you discuss any questions you have with your health care provider. Document Released: 03/20/2003 Document Revised: 07/18/2015 Document Reviewed: 06/09/2015 Elsevier Interactive Patient Education  2017 [place]. Blood Glucose Monitoring, Adult Monitoring your blood sugar (glucose) helps you manage your diabetes. It also helps you and your health care provider determine how well your diabetes management plan is working. Blood glucose monitoring involves checking your blood glucose as often as directed, and keeping a record (log) of your results over time. Why should I monitor my blood glucose? Checking your blood glucose regularly can:  Help you understand how food, exercise, illnesses, and medicines affect your blood glucose.  Let you know what your blood glucose is at any time. You can quickly tell if you are having low blood glucose (hypoglycemia) or high blood glucose (hyperglycemia).  Help you and your health care provider adjust your medicines as needed. When should I check my blood glucose? Follow instructions from your health care provider about how often to check your blood glucose. This may depend on:  The type of diabetes you have.  How well-controlled your diabetes is.  Medicines you are taking. If you have type 1 diabetes:   Check your blood glucose at least 2 times a day.  Also check your blood glucose:  Before every insulin injection.  Before and after exercise.  Between meals.  2 hours after a meal.  Occasionally between 2:00 a.m. and 3:00 a.m., as directed.  Before potentially dangerous tasks, like driving or using heavy machinery.  At bedtime.  You may need to check your blood glucose more often, up to 6-10 times a day:  If you use an insulin pump.  If you need multiple daily injections (MDI).  If your diabetes is not  well-controlled.  If you are ill.  If you have a history of severe hypoglycemia.  If you have a history of not knowing when your blood glucose is getting low (hypoglycemia unawareness). If you have type 2 diabetes:   If you take insulin or other diabetes medicines, check your blood glucose at least 2 times a day.  If you are on intensive insulin therapy, check your blood glucose at least 4 times a day. Occasionally, you may also need to check between 2:00 a.m. and 3:00 a.m., as directed.  Also check your blood glucose:  Before and after exercise.  Before potentially dangerous tasks, like driving or using heavy machinery.  You may need to check your blood glucose more often if:  Your medicine is being adjusted.  Your diabetes is not well-controlled.  You are ill. What is a blood glucose log?  A blood glucose log is a record of your blood glucose readings. It helps you and your health care provider:  Look for patterns in your blood glucose over time.  Adjust your diabetes management plan as needed.  Every time  you check your blood glucose, write down your result and notes about things that may be affecting your blood glucose, such as your diet and exercise for the day.  Most glucose meters store a record of glucose readings in the meter. Some meters allow you to download your records to a computer. How do I check my blood glucose? Follow these steps to get accurate readings of your blood glucose: Supplies needed    Blood glucose meter.  Test strips for your meter. Each meter has its own strips. You must use the strips that come with your meter.  A needle to prick your finger (lancet). Do not use lancets more than once.  A device that holds the lancet (lancing device).  A journal or log book to write down your results. Procedure   Wash your hands with soap and water.  Prick the side of your finger (not the tip) with the lancet. Use a different finger each  time.  Gently rub the finger until a small drop of blood appears.  Follow instructions that come with your meter for inserting the test strip, applying blood to the strip, and using your blood glucose meter.  Write down your result and any notes. Alternative testing sites   Some meters allow you to use areas of your body other than your finger (alternative sites) to test your blood.  If you think you may have hypoglycemia, or if you have hypoglycemia unawareness, do not use alternative sites. Use your finger instead.  Alternative sites may not be as accurate as the fingers, because blood flow is slower in these areas. This means that the result you get may be delayed, and it may be different from the result that you would get from your finger.  The most common alternative sites are:  Forearm.  Thigh.  Palm of the hand. Additional tips   Always keep your supplies with you.  If you have questions or need help, all blood glucose meters have a 24-hour "hotline" number that you can call. You may also contact your health care provider.  After you use a few boxes of test strips, adjust (calibrate) your blood glucose meter by following instructions that came with your meter. This information is not intended to replace advice given to you by your health care provider. Make sure you discuss any questions you have with your health care provider. Document Released: 12/31/2002 Document Revised: 07/18/2015 Document Reviewed: 06/09/2015 Elsevier Interactive Patient Education  2017 [employer].

## 2016-05-26 NOTE — Progress Notes (Signed)
Susan Horton, is a 46 y.o. female  CBS:[id number]  WGY:[id number]  DOB - 10-14-1970  Chief Complaint  Patient presents with  . Referral      Subjective:   Susan Horton is a 46 y.o. female with history of uncontrolled DM, HTN, nicotine use disorder,Asthma, and CKD stage 3 here today for a DM follow up visit and medication refill. Patient follows up regularly with multiple specialists including endocrinologist, cardiologist, Neurologist and Nephrologist. Her diabetes remains uncontrolled and her neuropathy is getting worse. She is frustrated and angry that nothing can be done. Since she does not have insurance, she could not get insulin pump which had worked for her in the past. She is having adjustment disorder because she feels incapacitated by her medical conditions. She is requesting medication for depression, she however denies any suicidal ideation or thoughts. Patient has No headache, No chest pain. No problems updated.  ALLERGIES: Allergies  Allergen Reactions  . Penicillin G Anaphylaxis, Itching, Swelling and Rash  . Penicillins Anaphylaxis, Itching, Swelling and Rash    Swelling of throat & whole mouth  Has patient had a PCN reaction causing immediate rash, facial/tongue/throat swelling, SOB or lightheadedness with hypotension: Yes Has patient had a PCN reaction causing severe rash involving mucus membranes or skin necrosis: No Has patient had a PCN reaction that required hospitalization: No Has patient had a PCN reaction occurring within the last 10 years: No If all of the above answers are "NO", then may proceed with Cephalosporin use.  [name] Lisinopril Cough    PAST MEDICAL HISTORY: Past Medical History:  Diagnosis Date  . Asthma    prn inhaler  . Diabetes mellitus    IDDM  . DVT (deep venous thrombosis) ([place])    20+ yrs ago (during pregnancy), 2015  . Essential hypertension, benign 12/11/2015  . GERD (gastroesophageal reflux disease)   . Headache(784.0)    migraines    . Hidradenitis     MEDICATIONS AT HOME: Prior to Admission medications   Medication Sig Start Date End Date Taking? Authorizing Provider  albuterol (PROVENTIL HFA;VENTOLIN HFA) 108 (90 BASE) MCG/ACT inhaler Inhale 1 puff into the lungs every 6 (six) hours as needed for wheezing or shortness of breath.   Yes [provider]  amLODipine (NORVASC) 10 MG tablet Take 1 tablet (10 mg total) by mouth daily. 01/21/16  Yes [name], MD  Capsaicin-Menthol-Methyl Sal (CAPSAICIN-METHYL SAL-MENTHOL) 0.025-1-12 % CREA Apply 1 application topically 4 (four) times daily as needed. For aches/pains, apply to skin. May need gloves. Patient taking differently: Apply 1 application topically 4 (four) times daily as needed (for aches or pains (may need gloves)).  02/24/16  Yes Langeland, Dawn T, MD  Ferrous Sulfate (IRON) 325 (65 Fe) MG TABS Take 325 mg by mouth daily. 11/25/15  Yes Noel, Tiffany S, PA-C  Fluticasone-Salmeterol (ADVAIR) 250-50 MCG/DOSE AEPB Inhale 1 puff into the lungs 2 (two) times daily as needed (FOR SHORTNESS OF BREATH).    Yes [provider]  gabapentin (NEURONTIN) 300 MG capsule Take 2 capsules (600 mg total) by mouth 3 (three) times daily. 03/10/16  Yes [name], MD  Insulin Glargine (TOUJEO SOLOSTAR) 300 UNIT/ML SOPN Inject 50 Units into the skin 2 (two) times daily. Patient taking differently: Inject 50 Units into the skin 2 (two) times daily after a meal.  04/15/16  Yes Philemon Kingdom, MD  insulin regular (NOVOLIN R,HUMULIN R) 250 units/2.33mL (100 units/mL) injection Inject 0.3 mLs (30 Units total) into the skin  3 (three) times daily before meals. Sliding scale 02/25/16  Yes Amiel Sharrow E, MD  Insulin Syringe 27G X 1/2" 0.5 ML MISC 1 Syringe by Does not apply route 5 (five) times daily. 11/20/15  Yes Orlie Dakin, MD  losartan (COZAAR) 100 MG tablet Take 1 tablet (100 mg total) by mouth daily. 01/21/16  Yes Edwin Cherian, Marlena Clipper, MD  lovastatin  (MEVACOR) 20 MG tablet Take 20 mg by mouth daily.   Yes [provider]  metoCLOPramide (REGLAN) 5 MG tablet Take 1 tablet (5 mg total) by mouth 4 (four) times daily -  before meals and at bedtime. 01/21/16  Yes [name], MD  metoprolol tartrate (LOPRESSOR) 25 MG tablet Take 1 tablet (25 mg total) by mouth 2 (two) times daily. 01/21/16  Yes [name], MD  mirtazapine (REMERON SOL-TAB) 15 MG disintegrating tablet Take 1 tablet (15 mg total) by mouth at bedtime. 02/18/16  Yes Ryler Laskowski, Marlena Clipper, MD  montelukast (SINGULAIR) 10 MG tablet Take 10 mg by mouth daily as needed (allergies).    Yes [provider]  omeprazole (PRILOSEC) 40 MG capsule Take 40 mg by mouth at bedtime.    Yes [provider]  DULoxetine (CYMBALTA) 30 MG capsule Take 1 capsule (30 mg total) by mouth daily. 05/26/16   [name], MD  glipiZIDE (GLUCOTROL) 10 MG tablet Take 1 tablet (10 mg total) by mouth 2 (two) times daily before a meal. 05/26/16   [name], MD    Objective:   Vitals:   05/26/16 1428  BP: 127/73  Pulse: (!) 102  Resp: 18  Temp: 98.9 F (37.2 C)  TempSrc: Oral  SpO2: 100%  Weight: 171 lb (77.6 kg)  Height: 5\' 5"  (1.651 m)   Exam General appearance : Awake, alert, not in any distress. Speech Clear. Not toxic looking HEENT: Atraumatic and Normocephalic, pupils equally reactive to light and accomodation Neck: Supple, no JVD. No cervical lymphadenopathy.  Chest: Good air entry bilaterally, no added sounds  CVS: S1 S2 regular, no murmurs.  Abdomen: Bowel sounds present, Non tender and not distended with no gaurding, rigidity or rebound. Extremities: B/L Lower Ext shows no edema, both legs are warm to touch Neurology: Awake alert, and oriented X 3, CN II-XII intact, Non focal Skin: No Rash  Data Review Lab Results  Component Value Date   HGBA1C 14.5 05/26/2016   HGBA1C >15.0 03/10/2016   HGBA1C >15 11/25/2015    Assessment &  Plan   1. Uncontrolled type 2 diabetes mellitus with peripheral neuropathy (HCC)  - POCT A1C - POCT UA - Microalbumin - Glucose (CBG) Insulin dosage was recently increased Questionable adherence although patient claims she takes her insulin regularly, yet her BS remains in the 400s.  Add - glipiZIDE (GLUCOTROL) 10 MG tablet; Take 1 tablet (10 mg total) by mouth 2 (two) times daily before a meal.  Dispense: 60 tablet; Refill: 3  - Aim for 30 minutes of exercise most days. Rethink what you drink. Water is great! Aim for 2-3 Carb Choices per meal (30-45 grams) +/- 1 either way  Aim for 0-15 Carbs per snack if hungry  Include protein in moderation with your meals and snacks  Consider reading food labels for Total Carbohydrate and Fat Grams of foods  Consider checking BG at alternate times per day  Continue taking medication as directed Be mindful about how much sugar you are adding to beverages and other foods. Fruit Punch - find one with  no sugar  Measure and decrease portions of carbohydrate foods  Make your plate and don't go back for seconds  2. Neuropathy  - Continue Gabapentin - Ambulatory referral to Neurology  3. Essential hypertension, benign  We have discussed target BP range and blood pressure goal. I have advised patient to check BP regularly and to call us back or report to clinic if the numbers are consistently higher than 140/90. We discussed the importance of compliance with medical therapy and DASH diet recommended, consequences of uncontrolled hypertension discussed.  - continue current BP medications  4. Diabetic nephropathy associated with type 2 diabetes mellitus ([place])  - Follow up with Nephrologist  5. Adjustment disorder with depressed mood  - DULoxetine (CYMBALTA) 30 MG capsule; Take 1 capsule (30 mg total) by mouth daily.  Dispense: 30 capsule; Refill: 3  Patient have been counseled extensively about nutrition and exercise. Other issues discussed  during this visit include: low cholesterol diet, weight control and daily exercise, foot care, annual eye examinations at Ophthalmology, importance of adherence with medications and regular follow-up. We also discussed long term complications of uncontrolled diabetes and hypertension.   Return in about 3 months (around 08/26/2016) for Hemoglobin A1C and Follow up, DM, Follow up HTN, Follow up Pain and comorbidities.  The patient was given clear instructions to go to ER or return to medical center if symptoms don't improve, worsen or new problems develop. The patient verbalized understanding. The patient was told to call to get lab results if they haven't heard anything in the next week.   This note has been created with Surveyor, quantity. Any transcriptional errors are unintentional.    Angelica Chessman, MD, [place], Karilyn Cota, [place] [facility] [place], [place]   05/26/2016, 2:58 PM

## 2016-05-28 MED FILL — !TOUJEO SOLOSTAR 300 UNITS/: 300/ML | 4 days supply | Qty: 1 | Fill #0

## 2016-05-28 MED FILL — NovoLIN R 100 UNIT/ML SOLN: 100 | 10 days supply | Qty: 10 | Fill #1

## 2016-05-31 ENCOUNTER — Encounter (INDEPENDENT_AMBULATORY_CARE_PROVIDER_SITE_OTHER): Admitting: Ophthalmology

## 2016-05-31 DIAGNOSIS — H43813 Vitreous degeneration, bilateral: Secondary | ICD-10-CM

## 2016-05-31 DIAGNOSIS — I10 Essential (primary) hypertension: Secondary | ICD-10-CM | POA: Diagnosis not present

## 2016-05-31 DIAGNOSIS — E103413 Type 1 diabetes mellitus with severe nonproliferative diabetic retinopathy with macular edema, bilateral: Secondary | ICD-10-CM

## 2016-05-31 DIAGNOSIS — H35033 Hypertensive retinopathy, bilateral: Secondary | ICD-10-CM

## 2016-05-31 DIAGNOSIS — E10311 Type 1 diabetes mellitus with unspecified diabetic retinopathy with macular edema: Secondary | ICD-10-CM | POA: Diagnosis not present

## 2016-06-11 ENCOUNTER — Ambulatory Visit (INDEPENDENT_AMBULATORY_CARE_PROVIDER_SITE_OTHER): Payer: Self-pay | Admitting: Neurology

## 2016-06-11 ENCOUNTER — Encounter: Payer: Self-pay | Admitting: Neurology

## 2016-06-11 VITALS — BP 160/90 | HR 107 | Ht 65.0 in | Wt 167.6 lb

## 2016-06-11 DIAGNOSIS — E1342 Other specified diabetes mellitus with diabetic polyneuropathy: Secondary | ICD-10-CM

## 2016-06-11 NOTE — Patient Instructions (Addendum)
Continue gabapentin 600mg  twice daily Based on your cardiac evaluation, we may consider starting nortriptyline Call my office in 1 month with an update of how you are doing

## 2016-06-11 NOTE — Progress Notes (Signed)
[name] Clinic Note - Initial Visit   Date: 06/11/16  Susan Horton MRN: [id number] DOB: 11/25/1970   Dear Dr. Doreene Burke:  Thank you for your kind referral of Susan Horton for consultation of bilateral feet pain. Although her history is well known to you, please allow [name] to reiterate it for the purpose of our medical record. The patient was accompanied to the clinic by self.   History of Present Illness: Susan Horton is a 46 y.o. right-handed [place] American female with poorly controlled insulin-dependent diabetes mellitus (PTW6F 68.1) complicated by CKD, neuropathy, and gastroparesis, hypertension, and GERD presenting for evaluation of bilateral feet pain.    Patient was diagnosed with diabetes at the age of 31 while pregnant with her daughter and has been on insulin since 1999.  Unfortunately, her diabetes has never been under good control and she suffered for complications from this including neuropathy, CKD, retinopathy, and gastroparesis.  She reports having neuropathy for many years, but symptoms have got worse over the past two years.  She has numbness, burning and tingling pain of the feet and the mid-calf.  She has difficulty with balance and falls. She still walk unassisted. Last year, she reports unknowingly having a needle in the sole of her foot which was seen incidentally in x-ray of her foot. Since this time, she is very cautious about examining her feet daily. She currently takes gabapentin 600mg  BID and Cymbalta 30mg  daily.  About a year ago, she also started having numbness and tingling of the fingertips and occasionally in the forearms.  She was working as a Designer, industrial/product and has always been very independent, but because of her medical problems stopped working last year.    She has no history of alcohol use, family history of neuropathy, or vitamin B12 deficiency.   Out-side paper records, electronic medical record, and images have been  reviewed where available and summarized as:  Lab Results  Component Value Date   CREATININE 2.60 (H) 04/30/2016   BUN 61 (H) 04/30/2016   NA 135 04/30/2016   K 5.0 04/30/2016   CL 103 04/30/2016   CO2 23 04/29/2016    Lab Results  Component Value Date   HGBA1C 14.5 05/26/2016   Lab Results  Component Value Date   VITAMINB12 655 06/06/2014    Past Medical History:  Diagnosis Date  . Asthma    prn inhaler  . Cataract   . Chest pain   . Chronic kidney disease   . Diabetes mellitus    IDDM  . DVT (deep venous thrombosis) ([place])    20+ yrs ago (during pregnancy), 2015  . Essential hypertension, benign 12/11/2015  . GERD (gastroesophageal reflux disease)   . Headache(784.0)    migraines  . Hidradenitis     Past Surgical History:  Procedure Laterality Date  . APPENDECTOMY  1995  . BREAST REDUCTION SURGERY  2002  . CHOLECYSTECTOMY  1993  . EXPLORATORY LAPAROTOMY  08/14/2005   lysis of adhesions, drainage of tubo-ovarian abscess  . HYDRADENITIS EXCISION  02/08/2011   Procedure: EXCISION HYDRADENITIS GROIN;  Surgeon: [name], MD;  Location: [facility];  Service: General;  Laterality: N/A;  Excisioin of Hidradenitis Left groin  . INGUINAL HIDRADENITIS EXCISION  07/06/2010   bilateral  . TONSILLECTOMY    . TUBAL LIGATION  1996  . tummy tuck    . VAGINAL HYSTERECTOMY  08/04/2005   and cysto     Medications:  Outpatient  Encounter Prescriptions as of 06/11/2016  Medication Sig Note  . albuterol (PROVENTIL HFA;VENTOLIN HFA) 108 (90 BASE) MCG/ACT inhaler Inhale 1 puff into the lungs every 6 (six) hours as needed for wheezing or shortness of breath.   [name] amLODipine (NORVASC) 10 MG tablet Take 1 tablet (10 mg total) by mouth daily.   . Calcifediol ER (RAYALDEE) 30 MCG CPCR Take 1 capsule by mouth daily.   . calcitRIOL (ROCALTROL) 0.25 MCG capsule Take 0.25 mcg by mouth daily.   . Capsaicin-Menthol-Methyl Sal (CAPSAICIN-METHYL SAL-MENTHOL) 0.025-1-12 %  CREA Apply 1 application topically 4 (four) times daily as needed. For aches/pains, apply to skin. May need gloves. (Patient taking differently: Apply 1 application topically 4 (four) times daily as needed (for aches or pains (may need gloves)). )   . DULoxetine (CYMBALTA) 30 MG capsule Take 1 capsule (30 mg total) by mouth daily.   . Ferrous Sulfate (IRON) 325 (65 Fe) MG TABS Take 325 mg by mouth daily.   . Fluticasone-Salmeterol (ADVAIR) 250-50 MCG/DOSE AEPB Inhale 1 puff into the lungs 2 (two) times daily as needed (FOR SHORTNESS OF BREATH).  04/29/2016: Patient stated that she DOES use this on an "as needed" basis  . gabapentin (NEURONTIN) 300 MG capsule Take 2 capsules (600 mg total) by mouth 3 (three) times daily.   [name] glipiZIDE (GLUCOTROL) 10 MG tablet Take 1 tablet (10 mg total) by mouth 2 (two) times daily before a meal.   . Insulin Glargine (TOUJEO SOLOSTAR) 300 UNIT/ML SOPN Inject 50 Units into the skin 2 (two) times daily. (Patient taking differently: Inject 50 Units into the skin 2 (two) times daily after a meal. ) 04/29/2016: Dosage verified by the patient  . insulin regular (NOVOLIN R,HUMULIN R) 250 units/2.34mL (100 units/mL) injection Inject 0.3 mLs (30 Units total) into the skin 3 (three) times daily before meals. Sliding scale 04/29/2016: Dosage verified by the patient  . Insulin Syringe 27G X 1/2" 0.5 ML MISC 1 Syringe by Does not apply route 5 (five) times daily.   [name] losartan (COZAAR) 100 MG tablet Take 1 tablet (100 mg total) by mouth daily.   [name] lovastatin (MEVACOR) 20 MG tablet Take 20 mg by mouth daily.   . metoCLOPramide (REGLAN) 5 MG tablet Take 1 tablet (5 mg total) by mouth 4 (four) times daily -  before meals and at bedtime.   . metoprolol tartrate (LOPRESSOR) 25 MG tablet Take 1 tablet (25 mg total) by mouth 2 (two) times daily.   . mirtazapine (REMERON SOL-TAB) 15 MG disintegrating tablet TAKE 1 TABLET BY MOUTH AT BEDTIME.   . montelukast (SINGULAIR) 10 MG tablet Take 10 mg by  mouth daily as needed (allergies).  04/29/2016: Takes when experiencing "flares" (resp.)  . omeprazole (PRILOSEC) 40 MG capsule Take 40 mg by mouth at bedtime.     No facility-administered encounter medications on file as of 06/11/2016.      Allergies:  Allergies  Allergen Reactions  . Penicillin G Anaphylaxis, Itching, Swelling and Rash  . Penicillins Anaphylaxis, Itching, Swelling and Rash    Swelling of throat & whole mouth  Has patient had a PCN reaction causing immediate rash, facial/tongue/throat swelling, SOB or lightheadedness with hypotension: Yes Has patient had a PCN reaction causing severe rash involving mucus membranes or skin necrosis: No Has patient had a PCN reaction that required hospitalization: No Has patient had a PCN reaction occurring within the last 10 years: No If all of the above answers are "NO", then may proceed  with Cephalosporin use.  [name] Lisinopril Cough    Family History: Family History  Problem Relation Age of Onset  . Hypertension Father   . Hypertension Sister   . Hypertension Brother     Social History: Social History  Substance Use Topics  . Smoking status: Current Some Day Smoker    Packs/day: 0.50    Years: 20.00    Types: Cigarettes  . Smokeless tobacco: Never Used     Comment: 1 pack/week  . Alcohol use No   Social History   Social History Narrative   Lives with daughter in a 2 story home.  Has 3 children.  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  Education: college.     Review of Systems:  CONSTITUTIONAL: No fevers, chills, night sweats, or weight loss.   EYES: No visual changes or eye pain ENT: No hearing changes.  No history of nose bleeds.   RESPIRATORY: No cough, wheezing and shortness of breath.   CARDIOVASCULAR: Negative for chest pain, +palpitations.   GI: Negative for abdominal discomfort, blood in stools or black stools.  No recent change in bowel habits.   GU:  No history of incontinence.     MUSCLOSKELETAL: +history of joint pain or swelling.  No myalgias.   SKIN: Negative for lesions, rash, and itching.   HEMATOLOGY/ONCOLOGY: Negative for prolonged bleeding, bruising easily, and swollen nodes.  No history of cancer.   ENDOCRINE: Negative for cold or heat intolerance, polydipsia or goiter.   PSYCH:  +depression or anxiety symptoms.   NEURO: As Above.   Vital Signs:  BP (!) 160/90   Pulse (!) 107   Ht 5\' 5"  (1.651 m)   Wt 167 lb 9 oz (76 kg)   SpO2 98%   BMI 27.88 kg/m    General Medical Exam:   General:  Well appearing, comfortable.   Eyes/ENT: see cranial nerve examination.   Neck: No masses appreciated.  Full range of motion without tenderness.  No carotid bruits. Respiratory:  Clear to auscultation, good air entry bilaterally.   Cardiac:  Regular rate and rhythm, no murmur.   Extremities:  No deformities, edema, or skin discoloration.  Skin:  No rashes or lesions.  Neurological Exam: MENTAL STATUS including orientation to time, place, person, recent and remote memory, attention span and concentration, language, and fund of knowledge is normal.  Speech is not dysarthric.  CRANIAL NERVES: II:  No visual field defects.  Unremarkable fundi.   III-IV-VI: Pupils equal round and reactive to light.  Normal conjugate, extra-ocular eye movements in all directions of gaze.  No nystagmus.  No ptosis.   V:  Normal facial sensation.    VII:  Normal facial symmetry and movements.   VIII:  Normal hearing and vestibular function.   IX-X:  Normal palatal movement.   XI:  Normal shoulder shrug and head rotation.   XII:  Normal tongue strength and range of motion, no deviation or fasciculation.  MOTOR:  No atrophy, fasciculations or abnormal movements.  No pronator drift.  Tone is normal.    Right Upper Extremity:    Left Upper Extremity:    Deltoid  5/5   Deltoid  5/5   Biceps  5/5   Biceps  5/5   Triceps  5/5   Triceps  5/5   Wrist extensors  5/5   Wrist extensors  5/5    Wrist flexors  5/5   Wrist flexors  5/5   Finger extensors  5/5   Finger extensors  5/5   Finger flexors  5/5   Finger flexors  5/5   Dorsal interossei  4+/5   Dorsal interossei  4+/5   Abductor pollicis  5/5   Abductor pollicis  5/5   Tone (Ashworth scale)  0  Tone (Ashworth scale)  0   Right Lower Extremity:    Left Lower Extremity:    Hip flexors  5/5   Hip flexors  5/5   Hip extensors  5/5   Hip extensors  5/5   Knee flexors  5/5   Knee flexors  5/5   Knee extensors  5/5   Knee extensors  5/5   Dorsiflexors  5/5   Dorsiflexors  5/5   Plantarflexors  5/5   Plantarflexors  5/5   Toe extensors  5-/5   Toe extensors  5/5   Toe flexors  2/5   Toe flexors  5/5   Tone (Ashworth scale)  0  Tone (Ashworth scale)  0   MSRs:  Right                                                                 Left brachioradialis 2+  brachioradialis 2+  biceps 2+  biceps 2+  triceps 2+  triceps 2+  patellar 0  Patellar 0  ankle jerk 0  ankle jerk 0  Hoffman no  Hoffman no  plantar response down  plantar response down   SENSORY:  Vibration is reduced to 50% at the knees, 25% at ankles, and trace at great toe.  Temperature and pin prick is reduced in a gradient manner distal to mid-calf.  There is also loss of pin prick and temperature in the finger tips bilaterally.  Prioprioception is impaired at the great toe.  Romberg's sign shows mild sway.   COORDINATION/GAIT: Normal finger-to- nose-finger.  Intact rapid alternating movements bilaterally. Gait is mildly antalgic, wide-based and unassisted.  She is able to heel walk and is unsteady with tandem gait.  IMPRESSION: Diabetic neuropathy following a stocking-glove distribution in the setting of poorly controlled insulin dependent diabetes.  She also has weakness of toe flexion and sensory ataxia.  Her last HbA1c is 14.5 and has been trending up lately, which is consistent to her worsening neuropathy. She is on gabapentin 600mg  BID for neuropathy and  Cymbalta 30mg  for depression, but cannot appreciate any benefit with her pain. Gabapentin cannot be increased due to her renal dysfunction.  We discussed alternative medication options such as switching to Lyrica or starting TCA.  Unfortunately, she does not have insurance at this time, so Lyrica would be costly.  I will plan to start her on nortriptyline, as long as her QTc is within normal limits. I do not have any recent EKG to review. She is scheduled to see cardiology for evaluation of palpitations next week and I will determine medication management based on her evaluation with cardiology.  She was counseled on daily foot inspection, fall precautions, and importance of tight glycemic control.    The duration of this appointment visit was 45 minutes of face-to-face time with the patient.  Greater than 50% of this time was spent in counseling, explanation of diagnosis, planning of further management, and coordination of care.   Thank  you for allowing me to participate in patient's care.  If I can answer any additional questions, I would be pleased to do so.    Sincerely,    Susan Horton K. Posey Pronto, DO

## 2016-06-14 ENCOUNTER — Ambulatory Visit: Payer: Self-pay | Attending: Internal Medicine

## 2016-06-15 ENCOUNTER — Encounter: Payer: Self-pay | Admitting: Cardiology

## 2016-06-15 ENCOUNTER — Ambulatory Visit (INDEPENDENT_AMBULATORY_CARE_PROVIDER_SITE_OTHER): Payer: No Typology Code available for payment source | Admitting: Cardiology

## 2016-06-15 VITALS — BP 138/80 | HR 100 | Ht 65.0 in | Wt 166.2 lb

## 2016-06-15 DIAGNOSIS — E0822 Diabetes mellitus due to underlying condition with diabetic chronic kidney disease: Secondary | ICD-10-CM

## 2016-06-15 DIAGNOSIS — F17200 Nicotine dependence, unspecified, uncomplicated: Secondary | ICD-10-CM | POA: Insufficient documentation

## 2016-06-15 DIAGNOSIS — Z794 Long term (current) use of insulin: Secondary | ICD-10-CM

## 2016-06-15 DIAGNOSIS — N184 Chronic kidney disease, stage 4 (severe): Secondary | ICD-10-CM

## 2016-06-15 DIAGNOSIS — R079 Chest pain, unspecified: Secondary | ICD-10-CM

## 2016-06-15 DIAGNOSIS — E0865 Diabetes mellitus due to underlying condition with hyperglycemia: Secondary | ICD-10-CM

## 2016-06-15 DIAGNOSIS — E785 Hyperlipidemia, unspecified: Secondary | ICD-10-CM | POA: Insufficient documentation

## 2016-06-15 DIAGNOSIS — R0989 Other specified symptoms and signs involving the circulatory and respiratory systems: Secondary | ICD-10-CM

## 2016-06-15 DIAGNOSIS — I10 Essential (primary) hypertension: Secondary | ICD-10-CM

## 2016-06-15 DIAGNOSIS — IMO0002 Reserved for concepts with insufficient information to code with codable children: Secondary | ICD-10-CM

## 2016-06-15 NOTE — Assessment & Plan Note (Signed)
Controlled.  

## 2016-06-15 NOTE — Assessment & Plan Note (Signed)
Check dopplers

## 2016-06-15 NOTE — Assessment & Plan Note (Signed)
On Mevacor 

## 2016-06-15 NOTE — Assessment & Plan Note (Signed)
Pt with past history of heavy smoking, now < 1/2 ppd

## 2016-06-15 NOTE — Assessment & Plan Note (Signed)
Pt referred to [name] for evaluation of chest pain with multiple cardiac risk factors

## 2016-06-15 NOTE — Progress Notes (Signed)
06/15/2016 Susan Horton   February 06, 1970  [id number]  Primary Physician Patient, No Pcp Per Primary Cardiologist: new- Dr Gwenlyn Found  HPI:  46 y/o AA female with a history of IDDM with nephropathy, retinopathy, and neuropathy, referred to [name] for evaluation of chest pain by Dr Jimmy Footman. She CRI stage 3. The pt describes intermittent mid sternal discomfort. It's not related to activity. No associated nausea, SOB, but occasionally radiates to her Lt upper arm. Onset of symptoms has been gradual. She says sometimes its worse with deep breathing.. Other cardiac risk factors include HTN, HLD, and smoking.    Current Outpatient Prescriptions  Medication Sig Dispense Refill  . albuterol (PROVENTIL HFA;VENTOLIN HFA) 108 (90 BASE) MCG/ACT inhaler Inhale 1 puff into the lungs every 6 (six) hours as needed for wheezing or shortness of breath.    [name] amLODipine (NORVASC) 10 MG tablet Take 1 tablet (10 mg total) by mouth daily. 90 tablet 3  . Calcifediol ER (RAYALDEE) 30 MCG CPCR Take 1 capsule by mouth daily.    . calcitRIOL (ROCALTROL) 0.25 MCG capsule Take 0.25 mcg by mouth daily.  6  . Capsaicin-Menthol-Methyl Sal (CAPSAICIN-METHYL SAL-MENTHOL) 0.025-1-12 % CREA Apply 1 application topically 4 (four) times daily as needed. For aches/pains, apply to skin. May need gloves. (Patient taking differently: Apply 1 application topically 4 (four) times daily as needed (for aches or pains (may need gloves)). ) 1 Tube 3  . DULoxetine (CYMBALTA) 30 MG capsule Take 1 capsule (30 mg total) by mouth daily. 30 capsule 3  . Ferrous Sulfate (IRON) 325 (65 Fe) MG TABS Take 325 mg by mouth daily. 30 each 3  . Fluticasone-Salmeterol (ADVAIR) 250-50 MCG/DOSE AEPB Inhale 1 puff into the lungs 2 (two) times daily as needed (FOR SHORTNESS OF BREATH).     [name] gabapentin (NEURONTIN) 300 MG capsule Take 2 capsules (600 mg total) by mouth 3 (three) times daily. 180 capsule 3  . glipiZIDE (GLUCOTROL) 10 MG tablet Take 1 tablet (10 mg total) by  mouth 2 (two) times daily before a meal. 60 tablet 3  . Insulin Glargine (TOUJEO SOLOSTAR) 300 UNIT/ML SOPN Inject 50 Units into the skin 2 (two) times daily. (Patient taking differently: Inject 50 Units into the skin 2 (two) times daily after a meal. ) 9 pen 5  . insulin regular (NOVOLIN R,HUMULIN R) 250 units/2.42mL (100 units/mL) injection Inject 0.3 mLs (30 Units total) into the skin 3 (three) times daily before meals. Sliding scale 30 mL 3  . Insulin Syringe 27G X 1/2" 0.5 ML MISC 1 Syringe by Does not apply route 5 (five) times daily. 100 each 0  . losartan (COZAAR) 100 MG tablet Take 1 tablet (100 mg total) by mouth daily. 90 tablet 3  . lovastatin (MEVACOR) 20 MG tablet Take 20 mg by mouth daily.    . metoCLOPramide (REGLAN) 5 MG tablet Take 1 tablet (5 mg total) by mouth 4 (four) times daily -  before meals and at bedtime. 90 tablet 3  . metoprolol tartrate (LOPRESSOR) 25 MG tablet Take 1 tablet (25 mg total) by mouth 2 (two) times daily. 180 tablet 3  . mirtazapine (REMERON SOL-TAB) 15 MG disintegrating tablet TAKE 1 TABLET BY MOUTH AT BEDTIME. 30 tablet 1  . montelukast (SINGULAIR) 10 MG tablet Take 10 mg by mouth daily as needed (allergies).     [name] omeprazole (PRILOSEC) 40 MG capsule Take 40 mg by mouth at bedtime.      No current facility-administered medications for  this visit.     Allergies  Allergen Reactions  . Penicillin G Anaphylaxis, Itching, Swelling and Rash  . Penicillins Anaphylaxis, Itching, Swelling and Rash    Swelling of throat & whole mouth  Has patient had a PCN reaction causing immediate rash, facial/tongue/throat swelling, SOB or lightheadedness with hypotension: Yes Has patient had a PCN reaction causing severe rash involving mucus membranes or skin necrosis: No Has patient had a PCN reaction that required hospitalization: No Has patient had a PCN reaction occurring within the last 10 years: No If all of the above answers are "NO", then may proceed with  Cephalosporin use.  [name] Lisinopril Cough    Past Medical History:  Diagnosis Date  . Asthma    prn inhaler  . Cataract   . Chest pain   . Chronic kidney disease   . Diabetes mellitus    IDDM  . DVT (deep venous thrombosis) ([place])    20+ yrs ago (during pregnancy), 2015  . Essential hypertension, benign 12/11/2015  . GERD (gastroesophageal reflux disease)   . Headache(784.0)    migraines  . Hidradenitis     Social History   Social History  . Marital status: Single    Spouse name: N/A  . Number of children: 3  . Years of education: 14   Occupational History  . Corrections officer    Social History Main Topics  . Smoking status: Current Some Day Smoker    Packs/day: 0.50    Years: 20.00    Types: Cigarettes  . Smokeless tobacco: Never Used     Comment: 1 pack/week  . Alcohol use No  . Drug use: No  . Sexual activity: Not on file   Other Topics Concern  . Not on file   Social History Narrative   Lives with daughter in a 2 story home.  Has 3 children.  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  Education: college.      Family History  Problem Relation Age of Onset  . Hypertension Father   . Hypertension Sister   . Hypertension Brother      Review of Systems: General: negative for chills, fever, night sweats or weight changes.  Cardiovascular: negative for chest pain, dyspnea on exertion, edema, orthopnea, palpitations, paroxysmal nocturnal dyspnea or shortness of breath Dermatological: negative for rash Respiratory: negative for cough or wheezing Urologic: negative for hematuria Abdominal: negative for nausea, vomiting, diarrhea, bright red blood per rectum, melena, or hematemesis Neurologic: negative for visual changes, syncope, or dizziness All other systems reviewed and are otherwise negative except as noted above.    Blood pressure 138/80, pulse 100, height 5\' 5"  (1.651 m), weight 166 lb 3.2 oz (75.4 kg), SpO2 99 %.  General  appearance: alert, cooperative and no distress Neck: no JVD and bilateral soft carotid bruits Lungs: clear to auscultation bilaterally Heart: regular rate and rhythm Abdomen: soft, non-tender; bowel sounds normal; no masses,  no organomegaly Extremities: extremities normal, atraumatic, no cyanosis or edema Pulses: 2+ and symmetric Skin: Skin color, texture, turgor normal. No rashes or lesions Neurologic: Grossly normal  EKG 04/29/16- NSR  ASSESSMENT AND PLAN:   Chest pain with moderate risk of acute coronary syndrome Pt referred to [name] for evaluation of chest pain with multiple cardiac risk factors  Diabetes mellitus due to underlying condition, uncontrolled, with stage 4 chronic kidney disease, with long-term current use of insulin (HCC) Pt has triopathy  Essential hypertension, benign Controlled  Carotid bruit present Check  dopplers  Dyslipidemia On Mevacor  Smoker Pt with past history of heavy smoking, now < 1/2 ppd   PLAN  I will arrange Lexiscan Myoview and carotid dopplers. Discussed with Dr Gwenlyn Found today in the office, he will see in f/u.   Kerin Ransom PA-C 06/15/2016 10:15 AM

## 2016-06-15 NOTE — Assessment & Plan Note (Signed)
Pt has triopathy

## 2016-06-15 NOTE — Patient Instructions (Addendum)
Medication Instructions:  Your physician recommends that you continue on your current medications as directed. Please refer to the Current Medication list given to you today  Labwork: NONE   Testing/Procedures: Your physician has requested that you have a lexiscan myoview. For further information please visit [email or web address]. Please follow instruction sheet, as given.  Your physician has requested that you have a carotid duplex. This test is an ultrasound of the carotid arteries in your neck. It looks at blood flow through these arteries that supply the brain with blood. Allow one hour for this exam. There are no restrictions or special instructions.  Follow-Up: Your physician recommends that you schedule a follow-up appointment in: WITH DR BERRY AFTER TESTING COMPLETED   If you need a refill on your cardiac medications before your next appointment, please call your pharmacy.

## 2016-06-28 ENCOUNTER — Encounter (INDEPENDENT_AMBULATORY_CARE_PROVIDER_SITE_OTHER): Admitting: Ophthalmology

## 2016-06-28 DIAGNOSIS — E113313 Type 2 diabetes mellitus with moderate nonproliferative diabetic retinopathy with macular edema, bilateral: Secondary | ICD-10-CM | POA: Diagnosis not present

## 2016-06-28 DIAGNOSIS — E11311 Type 2 diabetes mellitus with unspecified diabetic retinopathy with macular edema: Secondary | ICD-10-CM | POA: Diagnosis not present

## 2016-06-28 DIAGNOSIS — H43813 Vitreous degeneration, bilateral: Secondary | ICD-10-CM | POA: Diagnosis not present

## 2016-06-28 DIAGNOSIS — H35033 Hypertensive retinopathy, bilateral: Secondary | ICD-10-CM | POA: Diagnosis not present

## 2016-06-28 DIAGNOSIS — I10 Essential (primary) hypertension: Secondary | ICD-10-CM

## 2016-07-05 ENCOUNTER — Emergency Department (HOSPITAL_COMMUNITY)
Admission: EM | Admit: 2016-07-05 | Discharge: 2016-07-06 | Disposition: A | Discharge status: Home / Self Care | Payer: Medicaid Other | Attending: Emergency Medicine | Admitting: Emergency Medicine

## 2016-07-05 ENCOUNTER — Emergency Department (HOSPITAL_COMMUNITY): Payer: Medicaid Other

## 2016-07-05 ENCOUNTER — Encounter (HOSPITAL_COMMUNITY): Payer: Self-pay

## 2016-07-05 DIAGNOSIS — Z794 Long term (current) use of insulin: Secondary | ICD-10-CM | POA: Diagnosis not present

## 2016-07-05 DIAGNOSIS — E1140 Type 2 diabetes mellitus with diabetic neuropathy, unspecified: Secondary | ICD-10-CM | POA: Insufficient documentation

## 2016-07-05 DIAGNOSIS — J45909 Unspecified asthma, uncomplicated: Secondary | ICD-10-CM | POA: Diagnosis not present

## 2016-07-05 DIAGNOSIS — R072 Precordial pain: Secondary | ICD-10-CM

## 2016-07-05 DIAGNOSIS — E1121 Type 2 diabetes mellitus with diabetic nephropathy: Secondary | ICD-10-CM | POA: Diagnosis not present

## 2016-07-05 DIAGNOSIS — F17210 Nicotine dependence, cigarettes, uncomplicated: Secondary | ICD-10-CM | POA: Insufficient documentation

## 2016-07-05 DIAGNOSIS — R0602 Shortness of breath: Secondary | ICD-10-CM | POA: Insufficient documentation

## 2016-07-05 DIAGNOSIS — K5900 Constipation, unspecified: Secondary | ICD-10-CM

## 2016-07-05 DIAGNOSIS — E1165 Type 2 diabetes mellitus with hyperglycemia: Secondary | ICD-10-CM | POA: Diagnosis present

## 2016-07-05 DIAGNOSIS — E1122 Type 2 diabetes mellitus with diabetic chronic kidney disease: Secondary | ICD-10-CM | POA: Insufficient documentation

## 2016-07-05 DIAGNOSIS — E860 Dehydration: Secondary | ICD-10-CM | POA: Diagnosis not present

## 2016-07-05 DIAGNOSIS — Z79899 Other long term (current) drug therapy: Secondary | ICD-10-CM | POA: Diagnosis not present

## 2016-07-05 DIAGNOSIS — N184 Chronic kidney disease, stage 4 (severe): Secondary | ICD-10-CM | POA: Insufficient documentation

## 2016-07-05 DIAGNOSIS — R739 Hyperglycemia, unspecified: Secondary | ICD-10-CM

## 2016-07-05 HISTORY — DX: Gastroparesis: K31.84

## 2016-07-05 LAB — CBC
HCT: 29 % — ABNORMAL LOW (ref 36.0–46.0)
Hemoglobin: 9 g/dL — ABNORMAL LOW (ref 12.0–15.0)
MCH: 24.1 pg — ABNORMAL LOW (ref 26.0–34.0)
MCHC: 31 g/dL (ref 30.0–36.0)
MCV: 77.5 fL — ABNORMAL LOW (ref 78.0–100.0)
PLATELETS: 427 10*3/uL — AB (ref 150–400)
RBC: 3.74 MIL/uL — AB (ref 3.87–5.11)
RDW: 12.5 % (ref 11.5–15.5)
WBC: 10.7 10*3/uL — AB (ref 4.0–10.5)

## 2016-07-05 LAB — I-STAT TROPONIN, ED: TROPONIN I, POC: 0.02 ng/mL (ref 0.00–0.08)

## 2016-07-05 LAB — BASIC METABOLIC PANEL
Anion gap: 5 (ref 5–15)
BUN: 50 mg/dL — ABNORMAL HIGH (ref 6–20)
CALCIUM: 8.6 mg/dL — AB (ref 8.9–10.3)
CHLORIDE: 100 mmol/L — AB (ref 101–111)
CO2: 25 mmol/L (ref 22–32)
CREATININE: 2.75 mg/dL — AB (ref 0.44–1.00)
GFR, EST AFRICAN AMERICAN: 23 mL/min — AB (ref >60)
GFR, EST NON AFRICAN AMERICAN: 20 mL/min — AB (ref >60)
Glucose, Bld: 514 mg/dL (ref 65–99)
Potassium: 5 mmol/L (ref 3.5–5.1)
Sodium: 130 mmol/L — ABNORMAL LOW (ref 135–145)

## 2016-07-05 LAB — CBG MONITORING, ED: GLUCOSE-CAPILLARY: 472 mg/dL — AB (ref 65–99)

## 2016-07-05 NOTE — ED Triage Notes (Signed)
Per pt her blood sugar was 502 at home around 3 pm, pt took 50 units of Insulin R at that same time. Pt is also complaining of chest pain that has been going on "for the past couple of weeks, sometimes it hurts worse than other, walking and breathing make it worse, nothing makes it better, it just calms down on its own." Pain in chest is 6/10. Pt has multiple complaints of pain in arms, legs, and hands that has been going on "for months, they said it's neuropathy".  CBG is 472 in triage. Pts skin is warm and dry, appropriate color for ethnicity.

## 2016-07-05 NOTE — ED Notes (Signed)
Date and time results received: 07/05/16 1918 (use smartphrase ".now" to insert current time)  Test: glucose Critical Value: 514  Name of Provider Notified: [facility]  Orders Received? Or Actions Taken?: no additional orders at this time

## 2016-07-06 ENCOUNTER — Other Ambulatory Visit: Payer: Self-pay

## 2016-07-06 LAB — URINALYSIS, ROUTINE W REFLEX MICROSCOPIC
BILIRUBIN URINE: NEGATIVE
Glucose, UA: >=500 mg/dL — AB
HGB URINE DIPSTICK: NEGATIVE
Ketones, ur: NEGATIVE mg/dL
Leukocytes, UA: NEGATIVE
NITRITE: NEGATIVE
PROTEIN: 100 mg/dL — AB
Specific Gravity, Urine: 1.013 (ref 1.005–1.030)
pH: 6 (ref 5.0–8.0)

## 2016-07-06 LAB — CBG MONITORING, ED
GLUCOSE-CAPILLARY: 161 mg/dL — AB (ref 65–99)
Glucose-Capillary: 443 mg/dL — ABNORMAL HIGH (ref 65–99)

## 2016-07-06 LAB — I-STAT TROPONIN, ED: TROPONIN I, POC: 0 ng/mL (ref 0.00–0.08)

## 2016-07-06 LAB — BRAIN NATRIURETIC PEPTIDE: B NATRIURETIC PEPTIDE 5: 29.5 pg/mL (ref 0.0–100.0)

## 2016-07-06 LAB — D-DIMER, QUANTITATIVE: D-Dimer, Quant: 0.46 ug/mL-FEU (ref 0.00–0.50)

## 2016-07-06 MED ORDER — SODIUM CHLORIDE 0.9 % IV BOLUS (SEPSIS)
1000.0000 mL | Freq: Once | INTRAVENOUS | Status: AC
  Administered 2016-07-06: 1000 mL via INTRAVENOUS

## 2016-07-06 MED ORDER — INSULIN ASPART 100 UNIT/ML ~~LOC~~ SOLN
10.0000 [IU] | Freq: Once | SUBCUTANEOUS | Status: AC
  Administered 2016-07-06: 10 [IU] via SUBCUTANEOUS
  Filled 2016-07-06: qty 1

## 2016-07-06 MED ORDER — SODIUM CHLORIDE 0.9 % IV SOLN
1000.0000 mL | INTRAVENOUS | Status: DC
  Administered 2016-07-06: 1000 mL via INTRAVENOUS

## 2016-07-06 NOTE — Discharge Instructions (Signed)
1. Medications: usual home medications 2. Treatment: rest, drink plenty of fluids, follow a diabetic diet 3. Follow Up: Please followup with your primary doctor in 5-7 days for discussion of your diagnoses and further evaluation after today's visit; if you do not have a primary care doctor use the resource guide provided to find one; Please return to the ER for worsening or atypical pain, return of high blood sugars or other concerns

## 2016-07-06 NOTE — ED Provider Notes (Signed)
[place] DEPT Provider Note   CSN: [id number] Arrival date & time: 07/05/16  1755     History   Chief Complaint Chief Complaint  Patient presents with  . Chest Pain  . Hyperglycemia    HPI Susan Horton is a 46 y.o. female with a hx of asthma, CKD, DVT (2015, not currently anticoagulated ), HTN, GERD, gastroparesis, IDDM presents to the Emergency Department with multiple complaints.  Pt reports her primary complaint was her elevated glucose and her chest pain.   Pt reports her persistent hyperglycemia noted earlier today.  She reports it was 502 before arriving in the ED and she took 50 units of reg insulin.  Pt reports her last oral intake was this morning.  She reports associated polyuria and polydipsia.   Pt also reports chest pain onset 1 month ago.  She reports waxing and waning central and left breast pain without resolution.  She reports she is scheduled for a stress test in July.  She denies previous cardiac testing.  She reports associated SOB, leg swelling and DOE.  She denies recurrent orthopnea, nausea, diaphoresis.  Record review shows that patient was evaluated by Mr. Reino Bellis of [facility] on 06/15/2016 and determined to have a moderate risk for acute coronary syndrome. She is scheduled for [facility] and carotid Dopplers on 07/13/2016 and will see Dr. Gwenlyn Found in follow-up.  Pt is also complaining of chronic pain in her feet and hands.  She has been dx with neuropathy, is followed by neurology and taking gabapentin.  She reports this pain keeps her up at night.  She reports chronic constipation.  She reports last normal BM was approx 2 weeks ago.  Only small, hard stools since then.    The history is provided by the patient and medical records. No language interpreter was used.    Past Medical History:  Diagnosis Date  . Asthma    prn inhaler  . Cataract   . Chest pain   . Chronic kidney disease   . Diabetes mellitus    IDDM  . DVT (deep venous  thrombosis) ([place])    20+ yrs ago (during pregnancy), 2015  . Essential hypertension, benign 12/11/2015  . Gastroparesis   . GERD (gastroesophageal reflux disease)   . Headache(784.0)    migraines  . Hidradenitis     Patient Active Problem List   Diagnosis Date Noted  . Carotid bruit present 06/15/2016  . Dyslipidemia 06/15/2016  . Smoker 06/15/2016  . Diabetes mellitus due to underlying condition, uncontrolled, with stage 4 chronic kidney disease, with long-term current use of insulin ([place]) 04/15/2016  . Neuropathy 03/10/2016  . Essential hypertension, benign 12/11/2015  . Diabetic nephropathy associated with type 2 diabetes mellitus ([place]) 12/11/2015  . Diabetic gastroparesis ([place])   . Chest pain with moderate risk of acute coronary syndrome 06/05/2014  . Intractable nausea and vomiting 06/05/2014  . Uncontrolled type 2 diabetes mellitus with peripheral neuropathy ([place]) 06/05/2014  . Normocytic anemia 06/05/2014  . Hyperglycemia   . Monilial vaginitis 07/10/2010    Past Surgical History:  Procedure Laterality Date  . APPENDECTOMY  1995  . BREAST REDUCTION SURGERY  2002  . CHOLECYSTECTOMY  1993  . EXPLORATORY LAPAROTOMY  08/14/2005   lysis of adhesions, drainage of tubo-ovarian abscess  . HYDRADENITIS EXCISION  02/08/2011   Procedure: EXCISION HYDRADENITIS GROIN;  Surgeon: [name], MD;  Location: [facility];  Service: General;  Laterality: N/A;  Excisioin of Hidradenitis Left  groin  . INGUINAL HIDRADENITIS EXCISION  07/06/2010   bilateral  . TONSILLECTOMY    . TUBAL LIGATION  1996  . tummy tuck    . VAGINAL HYSTERECTOMY  08/04/2005   and cysto    OB History    No data available       Home Medications    Prior to Admission medications   Medication Sig Start Date End Date Taking? Authorizing Provider  albuterol (PROVENTIL HFA;VENTOLIN HFA) 108 (90 BASE) MCG/ACT inhaler Inhale 1 puff into the lungs every 6 (six) hours as needed for wheezing or  shortness of breath.    [provider]  amLODipine (NORVASC) 10 MG tablet Take 1 tablet (10 mg total) by mouth daily. 01/21/16   [name], MD  Calcifediol ER (RAYALDEE) 30 MCG CPCR Take 1 capsule by mouth daily.    [provider]  calcitRIOL (ROCALTROL) 0.25 MCG capsule Take 0.25 mcg by mouth daily. 05/26/16   [provider]  Capsaicin-Menthol-Methyl Sal (CAPSAICIN-METHYL SAL-MENTHOL) 0.025-1-12 % CREA Apply 1 application topically 4 (four) times daily as needed. For aches/pains, apply to skin. May need gloves. Patient taking differently: Apply 1 application topically 4 (four) times daily as needed (for aches or pains (may need gloves)).  02/24/16   Maren Reamer, MD  DULoxetine (CYMBALTA) 30 MG capsule Take 1 capsule (30 mg total) by mouth daily. 05/26/16   [name], MD  Ferrous Sulfate (IRON) 325 (65 Fe) MG TABS Take 325 mg by mouth daily. 11/25/15   Brayton Caves, PA-C  Fluticasone-Salmeterol (ADVAIR) 250-50 MCG/DOSE AEPB Inhale 1 puff into the lungs 2 (two) times daily as needed (FOR SHORTNESS OF BREATH).     [provider]  gabapentin (NEURONTIN) 300 MG capsule Take 2 capsules (600 mg total) by mouth 3 (three) times daily. 03/10/16   [name], MD  glipiZIDE (GLUCOTROL) 10 MG tablet Take 1 tablet (10 mg total) by mouth 2 (two) times daily before a meal. 05/26/16   Jegede, Olugbemiga E, MD  Insulin Glargine (TOUJEO SOLOSTAR) 300 UNIT/ML SOPN Inject 50 Units into the skin 2 (two) times daily. Patient taking differently: Inject 50 Units into the skin 2 (two) times daily after a meal.  04/15/16   Philemon Kingdom, MD  insulin regular (NOVOLIN R,HUMULIN R) 250 units/2.27mL (100 units/mL) injection Inject 0.3 mLs (30 Units total) into the skin 3 (three) times daily before meals. Sliding scale 02/25/16   [name], MD  Insulin Syringe 27G X 1/2" 0.5 ML MISC 1 Syringe by Does not apply route 5 (five) times daily. 11/20/15    Orlie Dakin, MD  losartan (COZAAR) 100 MG tablet Take 1 tablet (100 mg total) by mouth daily. 01/21/16   [name], MD  lovastatin (MEVACOR) 20 MG tablet Take 20 mg by mouth daily.    [provider]  metoCLOPramide (REGLAN) 5 MG tablet Take 1 tablet (5 mg total) by mouth 4 (four) times daily -  before meals and at bedtime. 01/21/16   [name], MD  metoprolol tartrate (LOPRESSOR) 25 MG tablet Take 1 tablet (25 mg total) by mouth 2 (two) times daily. 01/21/16   [name], MD  mirtazapine (REMERON SOL-TAB) 15 MG disintegrating tablet TAKE 1 TABLET BY MOUTH AT BEDTIME. 05/26/16   [name], MD  montelukast (SINGULAIR) 10 MG tablet Take 10 mg by mouth daily as needed (allergies).     [provider]  omeprazole (PRILOSEC) 40 MG capsule  Take 40 mg by mouth at bedtime.     [provider]    Family History Family History  Problem Relation Age of Onset  . Hypertension Father   . Hypertension Sister   . Hypertension Brother     Social History Social History  Substance Use Topics  . Smoking status: Current Some Day Smoker    Packs/day: 0.50    Years: 20.00    Types: Cigarettes  . Smokeless tobacco: Never Used     Comment: 1 pack/week  . Alcohol use No     Allergies   Penicillin g; Penicillins; and Lisinopril   Review of Systems Review of Systems  Constitutional: Negative for appetite change, diaphoresis, fatigue, fever and unexpected weight change.  HENT: Negative for mouth sores.   Eyes: Negative for visual disturbance.  Respiratory: Positive for shortness of breath. Negative for cough, chest tightness and wheezing.   Cardiovascular: Positive for chest pain.  Gastrointestinal: Positive for constipation. Negative for abdominal pain, diarrhea, nausea and vomiting.  Endocrine: Positive for polydipsia and polyuria.  Genitourinary: Negative for dysuria, frequency, hematuria and urgency.  Musculoskeletal: Positive  for myalgias. Negative for back pain and neck stiffness.  Skin: Positive for wound. Negative for rash.  Neurological: Negative for syncope, light-headedness and headaches.  Hematological: Does not bruise/bleed easily.  Psychiatric/Behavioral: Negative for sleep disturbance. The patient is not nervous/anxious.   All other systems reviewed and are negative.    Physical Exam Updated Vital Signs BP 138/72 (BP Location: Right Arm)   Pulse (!) 104   Temp 98.7 F (37.1 C) (Oral)   Resp 11   Wt 76.5 kg (168 lb 9.6 oz)   SpO2 100%   BMI 28.06 kg/m   Physical Exam  Constitutional: She appears well-developed and well-nourished. No distress.  Awake, alert, nontoxic appearance  HENT:  Head: Normocephalic and atraumatic.  Mouth/Throat: Oropharynx is clear and moist. No oropharyngeal exudate.  Eyes: Conjunctivae are normal. No scleral icterus.  Neck: Normal range of motion. Neck supple.  Cardiovascular: Regular rhythm and intact distal pulses.  Tachycardia present.   No murmur heard. Pulses:      Radial pulses are 2+ on the right side, and 2+ on the left side.       Dorsalis pedis pulses are 2+ on the right side, and 2+ on the left side.  Pulmonary/Chest: Effort normal and breath sounds normal. No respiratory distress. She has no wheezes.  Equal chest expansion  Abdominal: Soft. Bowel sounds are normal. She exhibits no mass. There is no tenderness. There is no rebound and no guarding.  Musculoskeletal: Normal range of motion. She exhibits no edema.  Neurological: She is alert.  Speech is clear and goal oriented Moves extremities without ataxia  Skin: Skin is warm and dry. She is not diaphoretic.  Psychiatric: She has a normal mood and affect.  Nursing note and vitals reviewed.    ED Treatments / Results  Labs (all labs ordered are listed, but only abnormal results are displayed) Labs Reviewed  BASIC METABOLIC PANEL - Abnormal; Notable for the following:       Result Value    Sodium 130 (*)    Chloride 100 (*)    Glucose, Bld 514 (*)    BUN 50 (*)    Creatinine, Ser 2.75 (*)    Calcium 8.6 (*)    GFR calc non Af Amer 20 (*)    GFR calc Af Amer 23 (*)    All other components within normal  limits  CBC - Abnormal; Notable for the following:    WBC 10.7 (*)    RBC 3.74 (*)    Hemoglobin 9.0 (*)    HCT 29.0 (*)    MCV 77.5 (*)    MCH 24.1 (*)    Platelets 427 (*)    All other components within normal limits  URINALYSIS, ROUTINE W REFLEX MICROSCOPIC - Abnormal; Notable for the following:    Color, Urine STRAW (*)    Glucose, UA >=500 (*)    Protein, ur 100 (*)    Bacteria, UA RARE (*)    Squamous Epithelial / LPF 0-5 (*)    All other components within normal limits  CBG MONITORING, ED - Abnormal; Notable for the following:    Glucose-Capillary 472 (*)    All other components within normal limits  BRAIN NATRIURETIC PEPTIDE  D-DIMER, QUANTITATIVE (NOT AT [facility])  I-STAT TROPOININ, ED  I-STAT TROPOININ, ED  CBG MONITORING, ED    EKG  EKG Interpretation  Date/Time:  Monday July 05 2016 18:13:33 EDT Ventricular Rate:  107 PR Interval:  164 QRS Duration: 76 QT Interval:  320 QTC Calculation: 427 R Axis:   36 Text Interpretation:  Sinus tachycardia Otherwise normal ECG Confirmed by Thayer Jew [phone]) on 07/06/2016 3:35:53 AM      ED ECG REPORT   Date: 07/06/2016  Rate: 92  Rhythm: normal sinus rhythm  QRS Axis: normal  Intervals: normal  ST/T Wave abnormalities: nonspecific T wave changes  Conduction Disutrbances:none  Narrative Interpretation:   Old EKG Reviewed: tachycardia resolved  I have personally reviewed the EKG tracing and agree with the computerized printout as noted.   Radiology Dg Chest 2 View  Result Date: 07/05/2016 CLINICAL DATA:  Left chest pain. EXAM: CHEST  2 VIEW COMPARISON:  11/20/2015 and prior exams FINDINGS: The cardiomediastinal silhouette is unremarkable. There is no evidence of focal airspace disease,  pulmonary edema, suspicious pulmonary nodule/mass, pleural effusion, or pneumothorax. No acute bony abnormalities are identified. IMPRESSION: No active cardiopulmonary disease. Electronically Signed   By: Margarette Canada M.D.   On: 07/05/2016 18:51    Procedures Procedures (including critical care time)  Medications Ordered in ED Medications  sodium chloride 0.9 % bolus 1,000 mL (0 mLs Intravenous Stopped 07/06/16 0301)    Followed by  0.9 %  sodium chloride infusion (0 mLs Intravenous Stopped 07/06/16 0346)  insulin aspart (novoLOG) injection 10 Units (10 Units Subcutaneous Given 07/06/16 0152)     Initial Impression / Assessment and Plan / ED Course  I have reviewed the triage vital signs and the nursing notes.  Pertinent labs & imaging results that were available during my care of the patient were reviewed by me and considered in my medical decision making (see chart for details).  Clinical Course as of Jul 06 349  Tue Jul 06, 2016  0348 Repeat blood sugar 157  [HM]    Clinical Course User Index [HM] Asmar Brozek, Jarrett Soho, [place]    Patient presents with multiple complaints.  She is here with ongoing chest pain 1 month. She has scheduled outpatient cardiology follow-up. Initial and repeat troponin are within normal limits here. Repeat EKG remains without acute ischemia. Patient has previous history of DVT and is currently anticoagulated. No increased risk factors in the last few days. No unilateral leg swelling. No clinical evidence of DVT. D-dimer is negative.  Patient also with hyperglycemia but no evidence of DKA. No anion gap or ketones in her urine. Patient given insulin  and fluids here with significant improvement. She reports this made her feel much better. She's tolerated by mouth without difficulty. Her abdomen is soft and nontender. She reports constipation at home. I have recommended MiraLAX.  Other labs are reassuring.  Discussed these findings with patient and the importance of  her close cardiology follow-up. She states understanding. Also discussed reasons to return immediately to the emergency department. Patient is in agreement with the plan for discharge.  Final Clinical Impressions(s) / ED Diagnoses   Final diagnoses:  Precordial pain  Hyperglycemia  Dehydration  Constipation, unspecified constipation type    New Prescriptions New Prescriptions   No medications on file     Agapito Games 07/06/16 4132    Merryl Hacker, MD 07/06/16 [id number]

## 2016-07-06 NOTE — ED Notes (Signed)
CBG not crossing over. 150's. EDP aware.

## 2016-07-08 ENCOUNTER — Telehealth (HOSPITAL_COMMUNITY): Payer: Self-pay

## 2016-07-08 NOTE — Telephone Encounter (Signed)
Encounter complete. 

## 2016-07-11 DIAGNOSIS — I2510 Atherosclerotic heart disease of native coronary artery without angina pectoris: Secondary | ICD-10-CM | POA: Insufficient documentation

## 2016-07-12 ENCOUNTER — Telehealth (HOSPITAL_COMMUNITY): Payer: Self-pay | Admitting: Cardiology

## 2016-07-12 NOTE — Telephone Encounter (Signed)
I spoke with patient about rescheduling her myoview appt due to short staffing. She has been rescheduled for the [facility] st location for 10:00am tomorrow(which was oked by [place]). Patient was given address to the location and she voiced understanding.

## 2016-07-13 ENCOUNTER — Ambulatory Visit (HOSPITAL_BASED_OUTPATIENT_CLINIC_OR_DEPARTMENT_OTHER): Payer: Medicaid Other

## 2016-07-13 ENCOUNTER — Ambulatory Visit (HOSPITAL_COMMUNITY)
Admission: RE | Admit: 2016-07-13 | Discharge: 2016-07-13 | Disposition: A | Discharge status: Home / Self Care | Payer: Medicaid Other | Source: Ambulatory Visit | Attending: Cardiology | Admitting: Cardiology

## 2016-07-13 ENCOUNTER — Inpatient Hospital Stay (HOSPITAL_COMMUNITY): Admission: RE | Admit: 2016-07-13 | Payer: No Typology Code available for payment source | Source: Ambulatory Visit

## 2016-07-13 DIAGNOSIS — R9439 Abnormal result of other cardiovascular function study: Secondary | ICD-10-CM | POA: Insufficient documentation

## 2016-07-13 DIAGNOSIS — R079 Chest pain, unspecified: Secondary | ICD-10-CM | POA: Diagnosis not present

## 2016-07-13 DIAGNOSIS — I6523 Occlusion and stenosis of bilateral carotid arteries: Secondary | ICD-10-CM

## 2016-07-13 DIAGNOSIS — R0989 Other specified symptoms and signs involving the circulatory and respiratory systems: Secondary | ICD-10-CM | POA: Diagnosis not present

## 2016-07-13 LAB — MYOCARDIAL PERFUSION IMAGING
LV dias vol: 90 mL (ref 46–106)
LV sys vol: 42 mL
Peak HR: 118 {beats}/min
RATE: 0.28
Rest HR: 96 {beats}/min
SDS: 4
SRS: 4
SSS: 8
TID: 1.03

## 2016-07-13 MED ORDER — REGADENOSON 0.4 MG/5ML IV SOLN
0.4000 mg | Freq: Once | INTRAVENOUS | Status: AC
  Administered 2016-07-13: 0.4 mg via INTRAVENOUS

## 2016-07-13 MED ORDER — TECHNETIUM TC 99M TETROFOSMIN IV KIT
30.6000 | PACK | Freq: Once | INTRAVENOUS | Status: AC | PRN
  Administered 2016-07-13: 30.6 via INTRAVENOUS
  Filled 2016-07-13: qty 31

## 2016-07-13 MED ORDER — TECHNETIUM TC 99M TETROFOSMIN IV KIT
10.2000 | PACK | Freq: Once | INTRAVENOUS | Status: AC | PRN
  Administered 2016-07-13: 10.2 via INTRAVENOUS
  Filled 2016-07-13: qty 11

## 2016-07-20 ENCOUNTER — Telehealth: Payer: Self-pay | Admitting: Internal Medicine

## 2016-07-20 ENCOUNTER — Encounter: Payer: Self-pay | Admitting: Cardiovascular Disease

## 2016-07-20 ENCOUNTER — Ambulatory Visit (INDEPENDENT_AMBULATORY_CARE_PROVIDER_SITE_OTHER): Payer: Self-pay | Admitting: Cardiovascular Disease

## 2016-07-20 ENCOUNTER — Other Ambulatory Visit: Payer: Self-pay | Admitting: Internal Medicine

## 2016-07-20 VITALS — BP 160/94 | HR 101 | Ht 65.0 in | Wt 164.0 lb

## 2016-07-20 DIAGNOSIS — I519 Heart disease, unspecified: Secondary | ICD-10-CM

## 2016-07-20 DIAGNOSIS — E785 Hyperlipidemia, unspecified: Secondary | ICD-10-CM

## 2016-07-20 DIAGNOSIS — R9439 Abnormal result of other cardiovascular function study: Secondary | ICD-10-CM

## 2016-07-20 DIAGNOSIS — I10 Essential (primary) hypertension: Secondary | ICD-10-CM

## 2016-07-20 DIAGNOSIS — R079 Chest pain, unspecified: Secondary | ICD-10-CM

## 2016-07-20 DIAGNOSIS — F17200 Nicotine dependence, unspecified, uncomplicated: Secondary | ICD-10-CM

## 2016-07-20 MED ORDER — METOPROLOL TARTRATE 50 MG PO TABS: 25.0000 mg | ORAL_TABLET | Freq: Two times a day (BID) | ORAL | 6 refills | Status: DC

## 2016-07-20 MED FILL — ?GLIPIZIDE 10 MG TABLET: 10 | 30 days supply | Qty: 60 | Fill #1

## 2016-07-20 MED FILL — TOUJEO SOLOSTAR 300 UNITS/M: 300 | 13 days supply | Qty: 5 | Fill #1

## 2016-07-20 MED FILL — LOSARTAN POTASSIUM 100 MG T: 100 | 30 days supply | Qty: 30 | Fill #2

## 2016-07-20 MED FILL — NovoLIN R 100 UNIT/ML SOLN: 100 | 10 days supply | Qty: 10 | Fill #2

## 2016-07-20 MED FILL — ?AMLODIPINE BESYLATE 10 MG: 10 | 30 days supply | Qty: 30 | Fill #2

## 2016-07-20 MED FILL — MIRTAZAPINE 15 MG ODT: 15 | 30 days supply | Qty: 30 | Fill #0

## 2016-07-20 MED FILL — ?METOPROLOL 50 MG TABLET: 50 | 60 days supply | Qty: 60 | Fill #0

## 2016-07-20 MED FILL — CALCITRIOL 0.25 MCG CAPSULE: 0.25 | 31 days supply | Qty: 31 | Fill #2

## 2016-07-20 MED FILL — ?DULOXETINE HCL DR 30 MG CA: 30 MG | 30 days supply | Qty: 30 | Fill #1

## 2016-07-20 NOTE — Assessment & Plan Note (Signed)
History of hyperlipidemia on statin therapy followed by her PCP. 

## 2016-07-20 NOTE — Progress Notes (Signed)
07/20/2016 [name]   02-13-1970  [id number]  Primary Physician System, Pcp Not In Primary Cardiologist: [name] MD Renae Gloss  HPI:  Ms. Dolle is a 46 year old mild to moderately overweight single African-American female mother of 94, grandmother and 4 grandchildren who was seen by Kerin Ransom on 06/15/16 because of new onset chest pain. She has a history of treated hypertension, hyperlipidemia and diabetes which she's had for the last 22 years. She has CKD 3  with creatinine running in the mid 3 range followed by Dr. Jimmy Footman. She's noticed chest pain over the last 3-4 months occurring several times a week lasting minutes at a time worse with exertion. She had a Myoview stress test was read as intermediate risk with inferior ischemia. Based on this, I decided to proceed with outpatient radial diagnostic coronary angiography using limited contrast.    Current Outpatient Prescriptions  Medication Sig Dispense Refill  . albuterol (PROVENTIL HFA;VENTOLIN HFA) 108 (90 BASE) MCG/ACT inhaler Inhale 1 puff into the lungs every 6 (six) hours as needed for wheezing or shortness of breath.    [name] amLODipine (NORVASC) 10 MG tablet Take 1 tablet (10 mg total) by mouth daily. 90 tablet 3  . Calcifediol ER (RAYALDEE) 30 MCG CPCR Take 1 capsule by mouth daily.    . calcitRIOL (ROCALTROL) 0.25 MCG capsule Take 0.25 mcg by mouth daily.  6  . Capsaicin-Menthol-Methyl Sal (CAPSAICIN-METHYL SAL-MENTHOL) 0.025-1-12 % CREA Apply 1 application topically 4 (four) times daily as needed. For aches/pains, apply to skin. May need gloves. (Patient taking differently: Apply 1 application topically 4 (four) times daily as needed (for aches or pains (may need gloves)). ) 1 Tube 3  . DULoxetine (CYMBALTA) 30 MG capsule Take 1 capsule (30 mg total) by mouth daily. 30 capsule 3  . Ferrous Sulfate (IRON) 325 (65 Fe) MG TABS Take 325 mg by mouth daily. 30 each 3  . Fluticasone-Salmeterol (ADVAIR) 250-50  MCG/DOSE AEPB Inhale 1 puff into the lungs 2 (two) times daily as needed (FOR SHORTNESS OF BREATH).     [name] gabapentin (NEURONTIN) 300 MG capsule Take 2 capsules (600 mg total) by mouth 3 (three) times daily. 180 capsule 3  . glipiZIDE (GLUCOTROL) 10 MG tablet Take 1 tablet (10 mg total) by mouth 2 (two) times daily before a meal. 60 tablet 3  . Insulin Glargine (TOUJEO SOLOSTAR) 300 UNIT/ML SOPN Inject 50 Units into the skin 2 (two) times daily. (Patient taking differently: Inject 50 Units into the skin 2 (two) times daily after a meal. ) 9 pen 5  . insulin regular (NOVOLIN R,HUMULIN R) 250 units/2.17mL (100 units/mL) injection Inject 0.3 mLs (30 Units total) into the skin 3 (three) times daily before meals. Sliding scale 30 mL 3  . Insulin Syringe 27G X 1/2" 0.5 ML MISC 1 Syringe by Does not apply route 5 (five) times daily. 100 each 0  . losartan (COZAAR) 100 MG tablet Take 1 tablet (100 mg total) by mouth daily. 90 tablet 3  . lovastatin (MEVACOR) 20 MG tablet Take 20 mg by mouth daily.    . metoCLOPramide (REGLAN) 5 MG tablet Take 1 tablet (5 mg total) by mouth 4 (four) times daily -  before meals and at bedtime. 90 tablet 3  . metoprolol tartrate (LOPRESSOR) 25 MG tablet Take 1 tablet (25 mg total) by mouth 2 (two) times daily. 180 tablet 3  . mirtazapine (REMERON SOL-TAB) 15 MG disintegrating tablet TAKE 1 TABLET  BY MOUTH AT BEDTIME. 30 tablet 1  . montelukast (SINGULAIR) 10 MG tablet Take 10 mg by mouth daily as needed (allergies).     [name] omeprazole (PRILOSEC) 40 MG capsule Take 40 mg by mouth at bedtime.      No current facility-administered medications for this visit.     Allergies  Allergen Reactions  . Penicillin G Anaphylaxis, Itching, Swelling and Rash  . Penicillins Anaphylaxis, Itching, Swelling and Rash    Swelling of throat & whole mouth  Has patient had a PCN reaction causing immediate rash, facial/tongue/throat swelling, SOB or lightheadedness with hypotension: Yes Has patient  had a PCN reaction causing severe rash involving mucus membranes or skin necrosis: No Has patient had a PCN reaction that required hospitalization: No Has patient had a PCN reaction occurring within the last 10 years: No If all of the above answers are "NO", then may proceed with Cephalosporin use.  [name] Lisinopril Cough    Social History   Social History  . Marital status: Single    Spouse name: N/A  . Number of children: 3  . Years of education: 14   Occupational History  . Corrections officer    Social History Main Topics  . Smoking status: Current Some Day Smoker    Packs/day: 0.50    Years: 20.00    Types: Cigarettes  . Smokeless tobacco: Never Used     Comment: 1 pack/week  . Alcohol use No  . Drug use: No  . Sexual activity: Not on file   Other Topics Concern  . Not on file   Social History Narrative   Lives with daughter in a 2 story home.  Has 3 children.  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  Education: college.      Review of Systems: General: negative for chills, fever, night sweats or weight changes.  Cardiovascular: negative for chest pain, dyspnea on exertion, edema, orthopnea, palpitations, paroxysmal nocturnal dyspnea or shortness of breath Dermatological: negative for rash Respiratory: negative for cough or wheezing Urologic: negative for hematuria Abdominal: negative for nausea, vomiting, diarrhea, bright red blood per rectum, melena, or hematemesis Neurologic: negative for visual changes, syncope, or dizziness All other systems reviewed and are otherwise negative except as noted above.    Blood pressure (!) 160/94, pulse (!) 101, height 5\' 5"  (1.651 m), weight 164 lb (74.4 kg).  General appearance: alert and no distress Neck: no adenopathy, no carotid bruit, no JVD, supple, symmetrical, trachea midline and thyroid not enlarged, symmetric, no tenderness/mass/nodules Lungs: clear to auscultation bilaterally Heart: regular  rate and rhythm, S1, S2 normal, no murmur, click, rub or gallop Extremities: extremities normal, atraumatic, no cyanosis or edema  EKG sinus tachycardia 101 without ST or T-wave changes.  I Personally reviewed his EKG.  ASSESSMENT AND PLAN:   Chest pain with moderate risk of acute coronary syndrome Ms. Bellefeuille returns today for follow-up for Myoview stress test was read as intermediate risk with mild inferior ischemia. She does have fairly new onset chest pain last 4 months occurring several times a week lasting minutes at a time, worse with exertion. There is factors include tobacco abuse, 20 years of being diabetic, treated hypertension and hyperlipidemia. She does have CK D3 creatinine is running in the mid to high 2 range making the possibility of radiocontrast nephropathy higher than one would normally expect although I think that the risk of performing angiography in the setting of her symptoms and abnormal Myoview way the  potential risks of radiocontrast nephropathy.The patient understands that risks included but are not limited to stroke (1 in 1000), death (1 in 34), kidney failure [usually temporary] (1 in 500), bleeding (1 in 200), allergic reaction [possibly serious] (1 in 200). The patient understands and agrees to proceed  Essential hypertension, benign History of essential hypertension blood pressure measured at 160/94. She is on amlodipine, metoprolol and losartan. Continue current meds at current dosing  Dyslipidemia History of hyperlipidemia on statin therapy followed by her PCP  Smoker She had continued tobacco abuse one and a half packs a day for the last 25 years up until 6 months ago and now she smokes one to 2 cigarettes a week.      [name] MD FACP,FACC,FAHA, [facility] 07/20/2016 10:57 AM

## 2016-07-20 NOTE — Assessment & Plan Note (Signed)
Susan Horton returns today for follow-up for Myoview stress test was read as intermediate risk with mild inferior ischemia. She does have fairly new onset chest pain last 4 months occurring several times a week lasting minutes at a time, worse with exertion. There is factors include tobacco abuse, 20 years of being diabetic, treated hypertension and hyperlipidemia. She does have CK D3 creatinine is running in the mid to high 2 range making the possibility of radiocontrast nephropathy higher than one would normally expect although I think that the risk of performing angiography in the setting of her symptoms and abnormal Myoview way the potential risks of radiocontrast nephropathy.The patient understands that risks included but are not limited to stroke (1 in 1000), death (1 in 1), kidney failure [usually temporary] (1 in 500), bleeding (1 in 200), allergic reaction [possibly serious] (1 in 200). The patient understands and agrees to proceed

## 2016-07-20 NOTE — Patient Instructions (Signed)
   [facility] [address] [place] [address] Dept: [phone] Loc: [phone]  Susan Horton  07/20/2016  You are scheduled for a Cardiac Catheterization on Thursday, July 26 with Dr. Quay Burow.  1. Please arrive at the [facility] (Main Entrance A) at [facility]: [address] [place], [place] [address] at 11:30 AM (two hours before your procedure to ensure your preparation). Free valet parking service is available.   Special note: Every effort is made to have your procedure done on time. Please understand that emergencies sometimes delay scheduled procedures.  2. Diet: Do not eat or drink anything after midnight prior to your procedure except sips of water to take medications.  3. Labs: You will need to have blood drawn on Monday, July 16 in our office. You do not need to be fasting.  4. Medication instructions in preparation for your procedure:  Stop taking, Glipizide on Wednesday, July 25.    Take only 25 units of insulin the night before your procedure. Do not take any insulin on the day of the procedure.  On the morning of your procedure, take any morning medicines NOT listed above.  You may use sips of water.  5. Plan for one night stay--bring personal belongings. 6. Bring a current list of your medications and current insurance cards. 7. You MUST have a responsible person to drive you home. 8. Someone MUST be with you the first 24 hours after you arrive home or your discharge will be delayed. 9. Please wear clothes that are easy to get on and off and wear slip-on shoes.  Thank you for allowing [name] to care for you!   -- [facility]    Your physician has requested that you have an echocardiogram prior to CATH. Echocardiography is a painless test that uses sound waves to create images of your heart. It provides your doctor with  information about the size and shape of your heart and how well your heart's chambers and valves are working. This procedure takes approximately one hour. There are no restrictions for this procedure.  Increase Metoprolol to 50 mg twice daily.

## 2016-07-20 NOTE — Assessment & Plan Note (Signed)
She had continued tobacco abuse one and a half packs a day for the last 25 years up until 6 months ago and now she smokes one to 2 cigarettes a week.

## 2016-07-20 NOTE — Telephone Encounter (Signed)
Pt called to request a referral for a Mammogram and Dentist, please if you auth this let the pt know, please follow up

## 2016-07-20 NOTE — Telephone Encounter (Signed)
Patient last saw you on 06/28/16. Patient is requesting a mammogram and dentist referral.

## 2016-07-20 NOTE — Assessment & Plan Note (Signed)
History of essential hypertension blood pressure measured at 160/94. She is on amlodipine, metoprolol and losartan. Continue current meds at current dosing

## 2016-07-22 ENCOUNTER — Other Ambulatory Visit: Payer: Self-pay | Admitting: Cardiovascular Disease

## 2016-07-22 DIAGNOSIS — R079 Chest pain, unspecified: Secondary | ICD-10-CM

## 2016-07-22 DIAGNOSIS — R9439 Abnormal result of other cardiovascular function study: Secondary | ICD-10-CM

## 2016-07-23 ENCOUNTER — Encounter (INDEPENDENT_AMBULATORY_CARE_PROVIDER_SITE_OTHER): Admitting: Ophthalmology

## 2016-07-23 DIAGNOSIS — I10 Essential (primary) hypertension: Secondary | ICD-10-CM

## 2016-07-23 DIAGNOSIS — E11311 Type 2 diabetes mellitus with unspecified diabetic retinopathy with macular edema: Secondary | ICD-10-CM

## 2016-07-23 DIAGNOSIS — H35033 Hypertensive retinopathy, bilateral: Secondary | ICD-10-CM

## 2016-07-23 DIAGNOSIS — H43813 Vitreous degeneration, bilateral: Secondary | ICD-10-CM

## 2016-07-23 DIAGNOSIS — E113313 Type 2 diabetes mellitus with moderate nonproliferative diabetic retinopathy with macular edema, bilateral: Secondary | ICD-10-CM

## 2016-07-27 ENCOUNTER — Ambulatory Visit (HOSPITAL_COMMUNITY): Payer: Medicaid Other | Attending: Cardiology

## 2016-07-27 ENCOUNTER — Other Ambulatory Visit: Payer: Self-pay

## 2016-07-27 DIAGNOSIS — R079 Chest pain, unspecified: Secondary | ICD-10-CM | POA: Diagnosis not present

## 2016-07-27 DIAGNOSIS — R9439 Abnormal result of other cardiovascular function study: Secondary | ICD-10-CM | POA: Diagnosis not present

## 2016-07-27 DIAGNOSIS — I351 Nonrheumatic aortic (valve) insufficiency: Secondary | ICD-10-CM | POA: Diagnosis not present

## 2016-07-27 DIAGNOSIS — I519 Heart disease, unspecified: Secondary | ICD-10-CM | POA: Diagnosis not present

## 2016-07-27 LAB — CBC WITH DIFFERENTIAL/PLATELET
BASOS ABS: 0 10*3/uL (ref 0.0–0.2)
Basos: 0 %
EOS (ABSOLUTE): 0.1 10*3/uL (ref 0.0–0.4)
Eos: 1 %
Hematocrit: 30.7 % — ABNORMAL LOW (ref 34.0–46.6)
Hemoglobin: 9.2 g/dL — ABNORMAL LOW (ref 11.1–15.9)
IMMATURE GRANS (ABS): 0 10*3/uL (ref 0.0–0.1)
IMMATURE GRANULOCYTES: 0 %
LYMPHS: 30 %
Lymphocytes Absolute: 2.9 10*3/uL (ref 0.7–3.1)
MCH: 24.6 pg — ABNORMAL LOW (ref 26.6–33.0)
MCHC: 30 g/dL — ABNORMAL LOW (ref 31.5–35.7)
MCV: 82 fL (ref 79–97)
Monocytes Absolute: 0.3 10*3/uL (ref 0.1–0.9)
Monocytes: 3 %
NEUTROS PCT: 66 %
Neutrophils Absolute: 6.3 10*3/uL (ref 1.4–7.0)
PLATELETS: 474 10*3/uL — AB (ref 150–379)
RBC: 3.74 x10E6/uL — AB (ref 3.77–5.28)
RDW: 13.6 % (ref 12.3–15.4)
WBC: 9.6 10*3/uL (ref 3.4–10.8)

## 2016-07-27 LAB — BASIC METABOLIC PANEL
BUN/Creatinine Ratio: 17 (ref 9–23)
BUN: 39 mg/dL — ABNORMAL HIGH (ref 6–24)
CALCIUM: 9.1 mg/dL (ref 8.7–10.2)
CHLORIDE: 98 mmol/L (ref 96–106)
CO2: 19 mmol/L — AB (ref 20–29)
Creatinine, Ser: 2.27 mg/dL — ABNORMAL HIGH (ref 0.57–1.00)
GFR calc Af Amer: 29 mL/min/{1.73_m2} — ABNORMAL LOW (ref >59)
GFR, EST NON AFRICAN AMERICAN: 25 mL/min/{1.73_m2} — AB (ref >59)
Glucose: 452 mg/dL — ABNORMAL HIGH (ref 65–99)
POTASSIUM: 5.3 mmol/L — AB (ref 3.5–5.2)
Sodium: 132 mmol/L — ABNORMAL LOW (ref 134–144)

## 2016-07-27 LAB — TSH: TSH: 1.93 u[IU]/mL (ref 0.450–4.500)

## 2016-07-27 LAB — PROTIME-INR
INR: 0.9 (ref 0.8–1.2)
Prothrombin Time: 9.7 s (ref 9.1–12.0)

## 2016-07-27 LAB — APTT: APTT: 29 s (ref 24–33)

## 2016-08-02 ENCOUNTER — Ambulatory Visit: Payer: Self-pay | Admitting: Internal Medicine

## 2016-08-02 DIAGNOSIS — Z0289 Encounter for other administrative examinations: Secondary | ICD-10-CM

## 2016-08-04 ENCOUNTER — Telehealth: Payer: Self-pay

## 2016-08-04 NOTE — Telephone Encounter (Signed)
Patient contacted pre-catheterization at [facility] scheduled for:  08/05/2016 @ 1330 Verified arrival time and place:  NT @ 1130 Confirmed AM meds to be taken pre-cath with sip of water:  Discussed diabetic medication schedule Confirmed patient has responsible person to drive home post procedure and observe patient for 24 hours:  yes Addl concerns:  Pt with Cr 2.27 and HGB 9.2.  Notified Pt that she may require overnight stay depending on how much dye is needed during procedure.  Pt indicates understanding.

## 2016-08-05 ENCOUNTER — Encounter (HOSPITAL_COMMUNITY)
Admission: RE | Disposition: A | Discharge status: Home / Self Care | Payer: Self-pay | Source: Ambulatory Visit | Attending: Cardiovascular Disease

## 2016-08-05 ENCOUNTER — Encounter (HOSPITAL_COMMUNITY): Payer: Self-pay | Admitting: Cardiovascular Disease

## 2016-08-05 ENCOUNTER — Other Ambulatory Visit: Payer: Self-pay

## 2016-08-05 ENCOUNTER — Ambulatory Visit (HOSPITAL_COMMUNITY)
Admission: RE | Admit: 2016-08-05 | Discharge: 2016-08-06 | Disposition: A | Discharge status: Home / Self Care | Payer: Medicaid Other | Source: Ambulatory Visit | Attending: Cardiovascular Disease | Admitting: Cardiovascular Disease

## 2016-08-05 DIAGNOSIS — Z794 Long term (current) use of insulin: Secondary | ICD-10-CM | POA: Diagnosis not present

## 2016-08-05 DIAGNOSIS — E1142 Type 2 diabetes mellitus with diabetic polyneuropathy: Secondary | ICD-10-CM | POA: Insufficient documentation

## 2016-08-05 DIAGNOSIS — I2510 Atherosclerotic heart disease of native coronary artery without angina pectoris: Secondary | ICD-10-CM

## 2016-08-05 DIAGNOSIS — Z9861 Coronary angioplasty status: Secondary | ICD-10-CM

## 2016-08-05 DIAGNOSIS — Z7951 Long term (current) use of inhaled steroids: Secondary | ICD-10-CM | POA: Insufficient documentation

## 2016-08-05 DIAGNOSIS — E1143 Type 2 diabetes mellitus with diabetic autonomic (poly)neuropathy: Secondary | ICD-10-CM | POA: Diagnosis not present

## 2016-08-05 DIAGNOSIS — N183 Chronic kidney disease, stage 3 unspecified: Secondary | ICD-10-CM | POA: Diagnosis present

## 2016-08-05 DIAGNOSIS — E1122 Type 2 diabetes mellitus with diabetic chronic kidney disease: Secondary | ICD-10-CM | POA: Insufficient documentation

## 2016-08-05 DIAGNOSIS — R9439 Abnormal result of other cardiovascular function study: Secondary | ICD-10-CM | POA: Diagnosis not present

## 2016-08-05 DIAGNOSIS — I25110 Atherosclerotic heart disease of native coronary artery with unstable angina pectoris: Secondary | ICD-10-CM | POA: Diagnosis not present

## 2016-08-05 DIAGNOSIS — I129 Hypertensive chronic kidney disease with stage 1 through stage 4 chronic kidney disease, or unspecified chronic kidney disease: Secondary | ICD-10-CM | POA: Diagnosis not present

## 2016-08-05 DIAGNOSIS — F4321 Adjustment disorder with depressed mood: Secondary | ICD-10-CM | POA: Diagnosis not present

## 2016-08-05 DIAGNOSIS — Z880 Allergy status to penicillin: Secondary | ICD-10-CM | POA: Insufficient documentation

## 2016-08-05 DIAGNOSIS — F17210 Nicotine dependence, cigarettes, uncomplicated: Secondary | ICD-10-CM | POA: Insufficient documentation

## 2016-08-05 DIAGNOSIS — E663 Overweight: Secondary | ICD-10-CM | POA: Diagnosis not present

## 2016-08-05 DIAGNOSIS — I10 Essential (primary) hypertension: Secondary | ICD-10-CM | POA: Diagnosis present

## 2016-08-05 DIAGNOSIS — E785 Hyperlipidemia, unspecified: Secondary | ICD-10-CM | POA: Insufficient documentation

## 2016-08-05 DIAGNOSIS — Z6828 Body mass index (BMI) 28.0-28.9, adult: Secondary | ICD-10-CM | POA: Insufficient documentation

## 2016-08-05 DIAGNOSIS — R079 Chest pain, unspecified: Secondary | ICD-10-CM

## 2016-08-05 DIAGNOSIS — K3184 Gastroparesis: Secondary | ICD-10-CM | POA: Insufficient documentation

## 2016-08-05 DIAGNOSIS — Z955 Presence of coronary angioplasty implant and graft: Secondary | ICD-10-CM

## 2016-08-05 DIAGNOSIS — I200 Unstable angina: Secondary | ICD-10-CM | POA: Diagnosis present

## 2016-08-05 HISTORY — PX: LEFT HEART CATH AND CORONARY ANGIOGRAPHY: CATH118249

## 2016-08-05 HISTORY — PX: CORONARY STENT INTERVENTION: CATH118234

## 2016-08-05 LAB — GLUCOSE, CAPILLARY
GLUCOSE-CAPILLARY: 127 mg/dL — AB (ref 65–99)
Glucose-Capillary: 336 mg/dL — ABNORMAL HIGH (ref 65–99)

## 2016-08-05 SURGERY — LEFT HEART CATH AND CORONARY ANGIOGRAPHY
Anesthesia: LOCAL

## 2016-08-05 MED ORDER — CALCIFEDIOL ER 30 MCG PO CPCR
1.0000 | ORAL_CAPSULE | Freq: Every day | ORAL | Status: DC
  Administered 2016-08-05: 30 ug via ORAL
  Filled 2016-08-05 (×2): qty 1

## 2016-08-05 MED ORDER — ONDANSETRON HCL 4 MG/2ML IJ SOLN
4.0000 mg | Freq: Four times a day (QID) | INTRAMUSCULAR | Status: DC | PRN
  Filled 2016-08-05: qty 2

## 2016-08-05 MED ORDER — METOPROLOL TARTRATE 25 MG PO TABS
25.0000 mg | ORAL_TABLET | Freq: Two times a day (BID) | ORAL | Status: DC
  Administered 2016-08-05: 25 mg via ORAL
  Filled 2016-08-05 (×2): qty 1

## 2016-08-05 MED ORDER — NITROGLYCERIN 1 MG/10 ML FOR IR/CATH LAB
INTRA_ARTERIAL | Status: AC
  Filled 2016-08-05: qty 10

## 2016-08-05 MED ORDER — MIDAZOLAM HCL 2 MG/2ML IJ SOLN
INTRAMUSCULAR | Status: DC | PRN
  Administered 2016-08-05 (×2): 1 mg via INTRAVENOUS

## 2016-08-05 MED ORDER — BIVALIRUDIN BOLUS VIA INFUSION - CUPID
INTRAVENOUS | Status: DC | PRN
  Administered 2016-08-05: 54.45 mg via INTRAVENOUS

## 2016-08-05 MED ORDER — FERROUS SULFATE 325 (65 FE) MG PO TABS
325.0000 mg | ORAL_TABLET | Freq: Every day | ORAL | Status: DC
  Administered 2016-08-06: 09:00:00 325 mg via ORAL
  Filled 2016-08-05 (×2): qty 1

## 2016-08-05 MED ORDER — HEPARIN SODIUM (PORCINE) 1000 UNIT/ML IJ SOLN
INTRAMUSCULAR | Status: DC | PRN
  Administered 2016-08-05: 3500 [IU] via INTRAVENOUS

## 2016-08-05 MED ORDER — ALBUTEROL SULFATE (2.5 MG/3ML) 0.083% IN NEBU: 3.0000 mL | INHALATION_SOLUTION | Freq: Four times a day (QID) | RESPIRATORY_TRACT | Status: DC | PRN

## 2016-08-05 MED ORDER — CLOPIDOGREL BISULFATE 75 MG PO TABS
75.0000 mg | ORAL_TABLET | Freq: Every day | ORAL | Status: DC
  Administered 2016-08-06: 75 mg via ORAL
  Filled 2016-08-05: qty 1

## 2016-08-05 MED ORDER — CALCITRIOL 0.25 MCG PO CAPS
0.2500 ug | ORAL_CAPSULE | Freq: Every day | ORAL | Status: DC
  Administered 2016-08-05: 21:00:00 0.25 ug via ORAL
  Filled 2016-08-05 (×2): qty 1

## 2016-08-05 MED ORDER — SODIUM CHLORIDE 0.9 % IV SOLN
INTRAVENOUS | Status: AC
  Administered 2016-08-05: 16:00:00 via INTRAVENOUS

## 2016-08-05 MED ORDER — HEPARIN (PORCINE) IN NACL 2-0.9 UNIT/ML-% IJ SOLN
INTRAMUSCULAR | Status: AC | PRN
  Administered 2016-08-05: 1000 mL

## 2016-08-05 MED ORDER — SODIUM CHLORIDE 0.9 % WEIGHT BASED INFUSION: 1.0000 mL/kg/h | INTRAVENOUS | Status: DC

## 2016-08-05 MED ORDER — MIDAZOLAM HCL 2 MG/2ML IJ SOLN
INTRAMUSCULAR | Status: AC
  Filled 2016-08-05: qty 2

## 2016-08-05 MED ORDER — METOCLOPRAMIDE HCL 5 MG PO TABS
5.0000 mg | ORAL_TABLET | Freq: Three times a day (TID) | ORAL | Status: DC
  Administered 2016-08-06: 5 mg via ORAL
  Filled 2016-08-05 (×5): qty 1

## 2016-08-05 MED ORDER — BIVALIRUDIN TRIFLUOROACETATE 250 MG IV SOLR
INTRAVENOUS | Status: AC
  Filled 2016-08-05: qty 250

## 2016-08-05 MED ORDER — ASPIRIN 81 MG PO CHEW
81.0000 mg | CHEWABLE_TABLET | Freq: Every day | ORAL | Status: DC
  Administered 2016-08-06: 81 mg via ORAL
  Filled 2016-08-05: qty 1

## 2016-08-05 MED ORDER — INSULIN ASPART 100 UNIT/ML ~~LOC~~ SOLN
0.0000 [IU] | Freq: Three times a day (TID) | SUBCUTANEOUS | Status: DC
  Administered 2016-08-05: 11 [IU] via SUBCUTANEOUS
  Administered 2016-08-05: 5 [IU] via SUBCUTANEOUS
  Administered 2016-08-06: 15 [IU] via SUBCUTANEOUS
  Filled 2016-08-05: qty 0.15

## 2016-08-05 MED ORDER — MIRTAZAPINE 15 MG PO TBDP: 15.0000 mg | ORAL_TABLET | Freq: Every day | ORAL | Status: DC

## 2016-08-05 MED ORDER — ANGIOPLASTY BOOK
Freq: Once | Status: AC
Start: 2016-08-05 — End: 2016-08-05
  Administered 2016-08-05: 21:00:00
  Filled 2016-08-05: qty 1

## 2016-08-05 MED ORDER — GABAPENTIN 300 MG PO CAPS
600.0000 mg | ORAL_CAPSULE | Freq: Three times a day (TID) | ORAL | Status: DC
  Administered 2016-08-05 – 2016-08-06 (×2): 600 mg via ORAL
  Filled 2016-08-05 (×3): qty 2

## 2016-08-05 MED ORDER — IOPAMIDOL (ISOVUE-370) INJECTION 76%
INTRAVENOUS | Status: AC
  Filled 2016-08-05: qty 50

## 2016-08-05 MED ORDER — FAMOTIDINE IN NACL 20-0.9 MG/50ML-% IV SOLN
INTRAVENOUS | Status: AC
  Filled 2016-08-05: qty 50

## 2016-08-05 MED ORDER — ACETAMINOPHEN 325 MG PO TABS: 650.0000 mg | ORAL_TABLET | ORAL | Status: DC | PRN

## 2016-08-05 MED ORDER — LABETALOL HCL 5 MG/ML IV SOLN: 10.0000 mg | INTRAVENOUS | Status: AC | PRN

## 2016-08-05 MED ORDER — CLOPIDOGREL BISULFATE 300 MG PO TABS
ORAL_TABLET | ORAL | Status: DC | PRN
  Administered 2016-08-05: 600 mg via ORAL

## 2016-08-05 MED ORDER — CLOPIDOGREL BISULFATE 300 MG PO TABS
ORAL_TABLET | ORAL | Status: AC
  Filled 2016-08-05: qty 2

## 2016-08-05 MED ORDER — MORPHINE SULFATE (PF) 4 MG/ML IV SOLN
2.0000 mg | INTRAVENOUS | Status: DC | PRN
  Administered 2016-08-05 (×2): 2 mg via INTRAVENOUS
  Filled 2016-08-05 (×2): qty 1

## 2016-08-05 MED ORDER — FAMOTIDINE IN NACL 20-0.9 MG/50ML-% IV SOLN
INTRAVENOUS | Status: AC | PRN
  Administered 2016-08-05: 20 mg via INTRAVENOUS

## 2016-08-05 MED ORDER — SODIUM CHLORIDE 0.9 % IV SOLN
INTRAVENOUS | Status: AC | PRN
  Administered 2016-08-05: 15:00:00
  Administered 2016-08-05: 1.75 mg/kg/h via INTRAVENOUS

## 2016-08-05 MED ORDER — INSULIN ASPART 100 UNIT/ML ~~LOC~~ SOLN
SUBCUTANEOUS | Status: AC
  Filled 2016-08-05: qty 1

## 2016-08-05 MED ORDER — FENTANYL CITRATE (PF) 100 MCG/2ML IJ SOLN
INTRAMUSCULAR | Status: DC | PRN
  Administered 2016-08-05 (×2): 25 ug via INTRAVENOUS
  Administered 2016-08-05: 50 ug via INTRAVENOUS

## 2016-08-05 MED ORDER — SODIUM CHLORIDE 0.9 % IV SOLN
1.7500 mg/kg/h | INTRAVENOUS | Status: AC
  Administered 2016-08-05: 18:00:00 1.75 mg/kg/h via INTRAVENOUS
  Filled 2016-08-05: qty 250

## 2016-08-05 MED ORDER — PANTOPRAZOLE SODIUM 40 MG PO TBEC
40.0000 mg | DELAYED_RELEASE_TABLET | Freq: Every day | ORAL | Status: DC
  Administered 2016-08-06: 09:00:00 40 mg via ORAL
  Filled 2016-08-05 (×2): qty 1

## 2016-08-05 MED ORDER — NITROGLYCERIN IN D5W 200-5 MCG/ML-% IV SOLN: 3.0000 ug/min | INTRAVENOUS | Status: DC

## 2016-08-05 MED ORDER — HEPARIN (PORCINE) IN NACL 2-0.9 UNIT/ML-% IJ SOLN
INTRAMUSCULAR | Status: AC
  Filled 2016-08-05: qty 1000

## 2016-08-05 MED ORDER — SODIUM CHLORIDE 0.9 % IV SOLN: 250.0000 mL | INTRAVENOUS | Status: DC | PRN

## 2016-08-05 MED ORDER — ASPIRIN 81 MG PO CHEW
CHEWABLE_TABLET | ORAL | Status: AC
  Filled 2016-08-05: qty 1

## 2016-08-05 MED ORDER — AMLODIPINE BESYLATE 10 MG PO TABS
10.0000 mg | ORAL_TABLET | Freq: Every day | ORAL | Status: DC
  Administered 2016-08-06: 10 mg via ORAL
  Filled 2016-08-05 (×2): qty 1

## 2016-08-05 MED ORDER — SODIUM CHLORIDE 0.9 % WEIGHT BASED INFUSION
3.0000 mL/kg/h | INTRAVENOUS | Status: DC
  Administered 2016-08-05: 3 mL/kg/h via INTRAVENOUS

## 2016-08-05 MED ORDER — NITROGLYCERIN 1 MG/10 ML FOR IR/CATH LAB
INTRA_ARTERIAL | Status: DC | PRN
  Administered 2016-08-05 (×2): 200 ug via INTRACORONARY

## 2016-08-05 MED ORDER — INSULIN ASPART 100 UNIT/ML ~~LOC~~ SOLN
SUBCUTANEOUS | Status: AC
  Administered 2016-08-05: 11 [IU] via SUBCUTANEOUS
  Filled 2016-08-05: qty 1

## 2016-08-05 MED ORDER — LIDOCAINE HCL (PF) 1 % IJ SOLN
INTRAMUSCULAR | Status: DC | PRN
Start: 2016-08-05 — End: 2016-08-05
  Administered 2016-08-05: 20 mL
  Administered 2016-08-05: 2 mL

## 2016-08-05 MED ORDER — LOSARTAN POTASSIUM 50 MG PO TABS
100.0000 mg | ORAL_TABLET | Freq: Every day | ORAL | Status: DC
  Administered 2016-08-06: 09:00:00 100 mg via ORAL
  Filled 2016-08-05 (×2): qty 2

## 2016-08-05 MED ORDER — SODIUM CHLORIDE 0.9% FLUSH: 3.0000 mL | INTRAVENOUS | Status: DC | PRN

## 2016-08-05 MED ORDER — VERAPAMIL HCL 2.5 MG/ML IV SOLN
INTRAVENOUS | Status: DC | PRN
  Administered 2016-08-05: 14:00:00 via INTRA_ARTERIAL

## 2016-08-05 MED ORDER — MONTELUKAST SODIUM 10 MG PO TABS: 10.0000 mg | ORAL_TABLET | Freq: Every day | ORAL | Status: DC | PRN

## 2016-08-05 MED ORDER — HYDRALAZINE HCL 20 MG/ML IJ SOLN: 5.0000 mg | INTRAMUSCULAR | Status: AC | PRN

## 2016-08-05 MED ORDER — ASPIRIN 81 MG PO CHEW
81.0000 mg | CHEWABLE_TABLET | ORAL | Status: AC
  Administered 2016-08-05: 81 mg via ORAL

## 2016-08-05 MED ORDER — SODIUM CHLORIDE 0.9 % IV SOLN
INTRAVENOUS | Status: AC | PRN
  Administered 2016-08-05: 10 mL/h via INTRAVENOUS

## 2016-08-05 MED ORDER — SODIUM CHLORIDE 0.9% FLUSH
3.0000 mL | Freq: Two times a day (BID) | INTRAVENOUS | Status: DC
  Administered 2016-08-06: 09:00:00 3 mL via INTRAVENOUS

## 2016-08-05 MED ORDER — GLIPIZIDE 5 MG PO TABS
10.0000 mg | ORAL_TABLET | Freq: Two times a day (BID) | ORAL | Status: DC
  Administered 2016-08-06: 10 mg via ORAL
  Filled 2016-08-05 (×2): qty 2

## 2016-08-05 MED ORDER — VERAPAMIL HCL 2.5 MG/ML IV SOLN
INTRAVENOUS | Status: AC
  Filled 2016-08-05: qty 2

## 2016-08-05 MED ORDER — NITROGLYCERIN IN D5W 200-5 MCG/ML-% IV SOLN
INTRAVENOUS | Status: AC | PRN
  Administered 2016-08-05: 10 ug/min via INTRAVENOUS

## 2016-08-05 MED ORDER — ACTIVE PARTNERSHIP FOR HEALTH OF YOUR HEART BOOK
Freq: Once | Status: AC
  Administered 2016-08-05: 23:00:00
  Filled 2016-08-05: qty 1

## 2016-08-05 MED ORDER — NITROGLYCERIN IN D5W 200-5 MCG/ML-% IV SOLN
INTRAVENOUS | Status: AC
  Filled 2016-08-05: qty 250

## 2016-08-05 MED ORDER — FENTANYL CITRATE (PF) 100 MCG/2ML IJ SOLN
INTRAMUSCULAR | Status: AC
  Filled 2016-08-05: qty 2

## 2016-08-05 MED ORDER — HEPARIN SODIUM (PORCINE) 1000 UNIT/ML IJ SOLN
INTRAMUSCULAR | Status: AC
  Filled 2016-08-05: qty 1

## 2016-08-05 MED ORDER — INSULIN SYRINGE 27G X 1/2" 0.5 ML MISC
1.0000 | Freq: Every day | Status: DC
Start: 2016-08-05 — End: 2016-08-05

## 2016-08-05 SURGICAL SUPPLY — 23 items
BALLN SAPPHIRE 2.0X12 (BALLOONS) ×2
BALLOON SAPPHIRE 2.0X12 (BALLOONS) IMPLANT
CATH 5FR JL3.5 JR4 ANG PIG MP (CATHETERS) ×1 IMPLANT
CATH EXPO 5FR FL4 (CATHETERS) ×1 IMPLANT
CATH OPTITORQUE TIG 4.0 5F (CATHETERS) ×1 IMPLANT
CATH VISTA GUIDE 6FR XBLAD3.5 (CATHETERS) ×1 IMPLANT
DEVICE RAD COMP TR BAND LRG (VASCULAR PRODUCTS) ×1 IMPLANT
GLIDESHEATH SLEND A-KIT 6F 22G (SHEATH) ×1 IMPLANT
GUIDEWIRE INQWIRE 1.5J.035X260 (WIRE) IMPLANT
INQWIRE 1.5J .035X260CM (WIRE) ×2
KIT ENCORE 26 ADVANTAGE (KITS) ×1 IMPLANT
KIT HEART LEFT (KITS) ×2 IMPLANT
NDL SMART REG 18GX2-3/4 (NEEDLE) IMPLANT
NEEDLE SMART REG 18GX2-3/4 (NEEDLE) ×2 IMPLANT
PACK CARDIAC CATHETERIZATION (CUSTOM PROCEDURE TRAY) ×2 IMPLANT
SHEATH PINNACLE 5F 10CM (SHEATH) ×2 IMPLANT
SHEATH PINNACLE 6F 10CM (SHEATH) ×1 IMPLANT
STENT SYNERGY DES 2.5X16 (Permanent Stent) ×1 IMPLANT
TRANSDUCER W/STOPCOCK (MISCELLANEOUS) ×2 IMPLANT
TUBING CIL FLEX 10 FLL-RA (TUBING) ×3 IMPLANT
WIRE ASAHI PROWATER 180CM (WIRE) ×1 IMPLANT
WIRE EMERALD 3MM-J .035X150CM (WIRE) ×2 IMPLANT
WIRE HI TORQ VERSACORE-J 145CM (WIRE) ×1 IMPLANT

## 2016-08-05 NOTE — Interval H&P Note (Signed)
Cath Lab Visit (complete for each Cath Lab visit)  Clinical Evaluation Leading to the Procedure:   ACS: No.  Non-ACS:    Anginal Classification: CCS II  Anti-ischemic medical therapy: Maximal Therapy (2 or more classes of medications)  Non-Invasive Test Results: Intermediate-risk stress test findings: cardiac mortality 1-3%/year  Prior CABG: No previous CABG      History and Physical Interval Note:  08/05/2016 1:19 PM  Susan Horton  has presented today for surgery, with the diagnosis of cp, abnormal stress test  The various methods of treatment have been discussed with the patient and family. After consideration of risks, benefits and other options for treatment, the patient has consented to  Procedure(s): Left Heart Cath and Coronary Angiography (N/A) as a surgical intervention .  The patient's history has been reviewed, patient examined, no change in status, stable for surgery.  I have reviewed the patient's chart and labs.  Questions were answered to the patient's satisfaction.     Quay Burow

## 2016-08-05 NOTE — Progress Notes (Signed)
TR BAND REMOVAL  LOCATION:    right radial  DEFLATED PER PROTOCOL:    Yes.    TIME BAND OFF / DRESSING APPLIED:    22:30   SITE UPON ARRIVAL:    Level 0  SITE AFTER BAND REMOVAL:    Level 0  CIRCULATION SENSATION AND MOVEMENT:    Within Normal Limits   Yes.    COMMENTS:   Post TR band instructions given. Pt tolerated well. 

## 2016-08-05 NOTE — H&P (View-Only) (Signed)
07/20/2016 Susan Horton   24-Apr-1970  [id number]  Primary Physician System, Pcp Not In Primary Cardiologist: [name] MD Renae Gloss  HPI:  Susan Horton is a 46 year old mild to moderately overweight single African-American female mother of 76, grandmother and 4 grandchildren who was seen by Kerin Ransom on 06/15/16 because of new onset chest pain. She has a history of treated hypertension, hyperlipidemia and diabetes which she's had for the last 22 years. She has CKD 3  with creatinine running in the mid 3 range followed by Dr. Jimmy Footman. She's noticed chest pain over the last 3-4 months occurring several times a week lasting minutes at a time worse with exertion. She had a Myoview stress test was read as intermediate risk with inferior ischemia. Based on this, I decided to proceed with outpatient radial diagnostic coronary angiography using limited contrast.    Current Outpatient Prescriptions  Medication Sig Dispense Refill  . albuterol (PROVENTIL HFA;VENTOLIN HFA) 108 (90 BASE) MCG/ACT inhaler Inhale 1 puff into the lungs every 6 (six) hours as needed for wheezing or shortness of breath.    [name] amLODipine (NORVASC) 10 MG tablet Take 1 tablet (10 mg total) by mouth daily. 90 tablet 3  . Calcifediol ER (RAYALDEE) 30 MCG CPCR Take 1 capsule by mouth daily.    . calcitRIOL (ROCALTROL) 0.25 MCG capsule Take 0.25 mcg by mouth daily.  6  . Capsaicin-Menthol-Methyl Sal (CAPSAICIN-METHYL SAL-MENTHOL) 0.025-1-12 % CREA Apply 1 application topically 4 (four) times daily as needed. For aches/pains, apply to skin. May need gloves. (Patient taking differently: Apply 1 application topically 4 (four) times daily as needed (for aches or pains (may need gloves)). ) 1 Tube 3  . DULoxetine (CYMBALTA) 30 MG capsule Take 1 capsule (30 mg total) by mouth daily. 30 capsule 3  . Ferrous Sulfate (IRON) 325 (65 Fe) MG TABS Take 325 mg by mouth daily. 30 each 3  . Fluticasone-Salmeterol (ADVAIR) 250-50  MCG/DOSE AEPB Inhale 1 puff into the lungs 2 (two) times daily as needed (FOR SHORTNESS OF BREATH).     [name] gabapentin (NEURONTIN) 300 MG capsule Take 2 capsules (600 mg total) by mouth 3 (three) times daily. 180 capsule 3  . glipiZIDE (GLUCOTROL) 10 MG tablet Take 1 tablet (10 mg total) by mouth 2 (two) times daily before a meal. 60 tablet 3  . Insulin Glargine (TOUJEO SOLOSTAR) 300 UNIT/ML SOPN Inject 50 Units into the skin 2 (two) times daily. (Patient taking differently: Inject 50 Units into the skin 2 (two) times daily after a meal. ) 9 pen 5  . insulin regular (NOVOLIN R,HUMULIN R) 250 units/2.63mL (100 units/mL) injection Inject 0.3 mLs (30 Units total) into the skin 3 (three) times daily before meals. Sliding scale 30 mL 3  . Insulin Syringe 27G X 1/2" 0.5 ML MISC 1 Syringe by Does not apply route 5 (five) times daily. 100 each 0  . losartan (COZAAR) 100 MG tablet Take 1 tablet (100 mg total) by mouth daily. 90 tablet 3  . lovastatin (MEVACOR) 20 MG tablet Take 20 mg by mouth daily.    . metoCLOPramide (REGLAN) 5 MG tablet Take 1 tablet (5 mg total) by mouth 4 (four) times daily -  before meals and at bedtime. 90 tablet 3  . metoprolol tartrate (LOPRESSOR) 25 MG tablet Take 1 tablet (25 mg total) by mouth 2 (two) times daily. 180 tablet 3  . mirtazapine (REMERON SOL-TAB) 15 MG disintegrating tablet TAKE 1 TABLET  BY MOUTH AT BEDTIME. 30 tablet 1  . montelukast (SINGULAIR) 10 MG tablet Take 10 mg by mouth daily as needed (allergies).     [name] omeprazole (PRILOSEC) 40 MG capsule Take 40 mg by mouth at bedtime.      No current facility-administered medications for this visit.     Allergies  Allergen Reactions  . Penicillin G Anaphylaxis, Itching, Swelling and Rash  . Penicillins Anaphylaxis, Itching, Swelling and Rash    Swelling of throat & whole mouth  Has patient had a PCN reaction causing immediate rash, facial/tongue/throat swelling, SOB or lightheadedness with hypotension: Yes Has patient  had a PCN reaction causing severe rash involving mucus membranes or skin necrosis: No Has patient had a PCN reaction that required hospitalization: No Has patient had a PCN reaction occurring within the last 10 years: No If all of the above answers are "NO", then may proceed with Cephalosporin use.  [name] Lisinopril Cough    Social History   Social History  . Marital status: Single    Spouse name: N/A  . Number of children: 3  . Years of education: 14   Occupational History  . Corrections officer    Social History Main Topics  . Smoking status: Current Some Day Smoker    Packs/day: 0.50    Years: 20.00    Types: Cigarettes  . Smokeless tobacco: Never Used     Comment: 1 pack/week  . Alcohol use No  . Drug use: No  . Sexual activity: Not on file   Other Topics Concern  . Not on file   Social History Narrative   Lives with daughter in a 2 story home.  Has 3 children.  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  Education: college.      Review of Systems: General: negative for chills, fever, night sweats or weight changes.  Cardiovascular: negative for chest pain, dyspnea on exertion, edema, orthopnea, palpitations, paroxysmal nocturnal dyspnea or shortness of breath Dermatological: negative for rash Respiratory: negative for cough or wheezing Urologic: negative for hematuria Abdominal: negative for nausea, vomiting, diarrhea, bright red blood per rectum, melena, or hematemesis Neurologic: negative for visual changes, syncope, or dizziness All other systems reviewed and are otherwise negative except as noted above.    Blood pressure (!) 160/94, pulse (!) 101, height 5\' 5"  (1.651 m), weight 164 lb (74.4 kg).  General appearance: alert and no distress Neck: no adenopathy, no carotid bruit, no JVD, supple, symmetrical, trachea midline and thyroid not enlarged, symmetric, no tenderness/mass/nodules Lungs: clear to auscultation bilaterally Heart: regular  rate and rhythm, S1, S2 normal, no murmur, click, rub or gallop Extremities: extremities normal, atraumatic, no cyanosis or edema  EKG sinus tachycardia 101 without ST or T-wave changes.  I Personally reviewed his EKG.  ASSESSMENT AND PLAN:   Chest pain with moderate risk of acute coronary syndrome Ms. Huyett returns today for follow-up for Myoview stress test was read as intermediate risk with mild inferior ischemia. She does have fairly new onset chest pain last 4 months occurring several times a week lasting minutes at a time, worse with exertion. There is factors include tobacco abuse, 20 years of being diabetic, treated hypertension and hyperlipidemia. She does have CK D3 creatinine is running in the mid to high 2 range making the possibility of radiocontrast nephropathy higher than one would normally expect although I think that the risk of performing angiography in the setting of her symptoms and abnormal Myoview way the  potential risks of radiocontrast nephropathy.The patient understands that risks included but are not limited to stroke (1 in 1000), death (1 in 44), kidney failure [usually temporary] (1 in 500), bleeding (1 in 200), allergic reaction [possibly serious] (1 in 200). The patient understands and agrees to proceed  Essential hypertension, benign History of essential hypertension blood pressure measured at 160/94. She is on amlodipine, metoprolol and losartan. Continue current meds at current dosing  Dyslipidemia History of hyperlipidemia on statin therapy followed by her PCP  Smoker She had continued tobacco abuse one and a half packs a day for the last 25 years up until 6 months ago and now she smokes one to 2 cigarettes a week.      [name] MD FACP,FACC,FAHA, [facility] 07/20/2016 10:57 AM

## 2016-08-05 NOTE — Progress Notes (Signed)
Site area: right groin (Arterial & Venous)  Site Prior to Removal:  Level 0  Pressure Applied For 20 MINUTES    Minutes Beginning at 20:50  Manual:   Yes.    Patient Status During Pull:  WNL  Post Pull Groin Site:  Level 0  Post Pull Instructions Given:  Yes.    Post Pull Pulses Present:  Yes.    Dressing Applied:  Yes.    Comments:  Pt tolerated well.

## 2016-08-05 NOTE — Care Management Note (Addendum)
Case Management Note  Patient Details  Name: Susan Horton MRN: [id number] Date of Birth: 05-17-1970  Subjective/Objective:  From home, pta indep, s/p Coronary Stent Intervention, will be on plavix.  She states her daughter will be with her at discharge.  She [facility] to [facility] and sees Dr. Doreene Burke.                   Action/Plan: NCM will follow for dc needs.   Expected Discharge Date:                  Expected Discharge Plan:  Home/Self Care  In-House Referral:     Discharge planning Services  CM Consult  Post Acute Care Choice:    Choice offered to:     DME Arranged:    DME Agency:     HH Arranged:    [place] Agency:     Status of Service:  Completed, signed off  If discussed at [employer] of Stay Meetings, dates discussed:    Additional Comments:  Zenon Mayo, RN 08/05/2016, 3:49 PM

## 2016-08-06 ENCOUNTER — Telehealth: Payer: Self-pay | Admitting: Cardiovascular Disease

## 2016-08-06 DIAGNOSIS — Z9861 Coronary angioplasty status: Secondary | ICD-10-CM

## 2016-08-06 DIAGNOSIS — I25110 Atherosclerotic heart disease of native coronary artery with unstable angina pectoris: Secondary | ICD-10-CM | POA: Diagnosis not present

## 2016-08-06 DIAGNOSIS — N183 Chronic kidney disease, stage 3 unspecified: Secondary | ICD-10-CM | POA: Diagnosis present

## 2016-08-06 DIAGNOSIS — Z955 Presence of coronary angioplasty implant and graft: Secondary | ICD-10-CM

## 2016-08-06 DIAGNOSIS — E0822 Diabetes mellitus due to underlying condition with diabetic chronic kidney disease: Secondary | ICD-10-CM

## 2016-08-06 DIAGNOSIS — I25119 Atherosclerotic heart disease of native coronary artery with unspecified angina pectoris: Secondary | ICD-10-CM

## 2016-08-06 DIAGNOSIS — E0865 Diabetes mellitus due to underlying condition with hyperglycemia: Secondary | ICD-10-CM

## 2016-08-06 DIAGNOSIS — Z794 Long term (current) use of insulin: Secondary | ICD-10-CM

## 2016-08-06 DIAGNOSIS — E1122 Type 2 diabetes mellitus with diabetic chronic kidney disease: Secondary | ICD-10-CM | POA: Diagnosis not present

## 2016-08-06 DIAGNOSIS — R9439 Abnormal result of other cardiovascular function study: Secondary | ICD-10-CM

## 2016-08-06 DIAGNOSIS — I2510 Atherosclerotic heart disease of native coronary artery without angina pectoris: Secondary | ICD-10-CM

## 2016-08-06 DIAGNOSIS — N184 Chronic kidney disease, stage 4 (severe): Secondary | ICD-10-CM

## 2016-08-06 DIAGNOSIS — I10 Essential (primary) hypertension: Secondary | ICD-10-CM

## 2016-08-06 DIAGNOSIS — I129 Hypertensive chronic kidney disease with stage 1 through stage 4 chronic kidney disease, or unspecified chronic kidney disease: Secondary | ICD-10-CM | POA: Diagnosis not present

## 2016-08-06 LAB — BASIC METABOLIC PANEL
ANION GAP: 8 (ref 5–15)
BUN: 30 mg/dL — ABNORMAL HIGH (ref 6–20)
CHLORIDE: 106 mmol/L (ref 101–111)
CO2: 19 mmol/L — AB (ref 22–32)
Calcium: 8.4 mg/dL — ABNORMAL LOW (ref 8.9–10.3)
Creatinine, Ser: 2.52 mg/dL — ABNORMAL HIGH (ref 0.44–1.00)
GFR calc non Af Amer: 22 mL/min — ABNORMAL LOW (ref >60)
GFR, EST AFRICAN AMERICAN: 25 mL/min — AB (ref >60)
Glucose, Bld: 271 mg/dL — ABNORMAL HIGH (ref 65–99)
Potassium: 4.5 mmol/L (ref 3.5–5.1)
Sodium: 133 mmol/L — ABNORMAL LOW (ref 135–145)

## 2016-08-06 LAB — GLUCOSE, CAPILLARY
Glucose-Capillary: 201 mg/dL — ABNORMAL HIGH (ref 65–99)
Glucose-Capillary: 353 mg/dL — ABNORMAL HIGH (ref 65–99)

## 2016-08-06 LAB — CBC
HEMATOCRIT: 28.9 % — AB (ref 36.0–46.0)
HEMOGLOBIN: 9.1 g/dL — AB (ref 12.0–15.0)
MCH: 24.3 pg — AB (ref 26.0–34.0)
MCHC: 31.5 g/dL (ref 30.0–36.0)
MCV: 77.1 fL — AB (ref 78.0–100.0)
Platelets: 398 10*3/uL (ref 150–400)
RBC: 3.75 MIL/uL — AB (ref 3.87–5.11)
RDW: 13 % (ref 11.5–15.5)
WBC: 11.6 10*3/uL — ABNORMAL HIGH (ref 4.0–10.5)

## 2016-08-06 LAB — POCT ACTIVATED CLOTTING TIME: Activated Clotting Time: 368 seconds

## 2016-08-06 MED ORDER — METOPROLOL TARTRATE 5 MG/5ML IV SOLN
5.0000 mg | Freq: Once | INTRAVENOUS | Status: AC
  Administered 2016-08-06: 5 mg via INTRAVENOUS
  Filled 2016-08-06: qty 5

## 2016-08-06 MED ORDER — METOPROLOL TARTRATE 25 MG PO TABS
50.0000 mg | ORAL_TABLET | Freq: Two times a day (BID) | ORAL | Status: DC
  Administered 2016-08-06: 09:00:00 50 mg via ORAL
  Filled 2016-08-06: qty 2

## 2016-08-06 MED ORDER — NITROGLYCERIN 0.4 MG SL SUBL: 0.4000 mg | SUBLINGUAL_TABLET | SUBLINGUAL | 3 refills | Status: DC | PRN

## 2016-08-06 MED ORDER — PANTOPRAZOLE SODIUM 40 MG PO TBEC: 40.0000 mg | DELAYED_RELEASE_TABLET | Freq: Every day | ORAL | 2 refills | Status: DC

## 2016-08-06 MED ORDER — CLOPIDOGREL BISULFATE 75 MG PO TABS: 75.0000 mg | ORAL_TABLET | Freq: Every day | ORAL | 2 refills | Status: DC

## 2016-08-06 MED ORDER — ASPIRIN 81 MG PO CHEW: 81.0000 mg | CHEWABLE_TABLET | Freq: Every day | ORAL | 0 refills | Status: AC

## 2016-08-06 MED ORDER — METOPROLOL TARTRATE 50 MG PO TABS: 50.0000 mg | ORAL_TABLET | Freq: Two times a day (BID) | ORAL | 6 refills | Status: DC

## 2016-08-06 MED FILL — ?PANTOPRAZOLE SOD DR 40MG: 40 MG | 30 days supply | Qty: 30 | Fill #0

## 2016-08-06 MED FILL — NITROSTAT 0.4 MG TABLET SL: 0.4 | 25 days supply | Qty: 25 | Fill #0

## 2016-08-06 MED FILL — CLOPIDOGREL 75 MG TABLET: 75 | 30 days supply | Qty: 30 | Fill #0

## 2016-08-06 NOTE — Discharge Summary (Signed)
Discharge Summary    Patient ID: Susan Horton,  MRN: [id number], DOB/AGE: [id number] 46 y.o.  Admit date: 08/05/2016 Discharge date: 08/06/2016  Primary Care Provider: [name] Primary Cardiologist: Gwenlyn Found   Discharge Diagnoses    Principal Problem:   Progressive angina [facility]) Active Problems:   Essential hypertension, benign   Diabetes mellitus due to underlying condition, uncontrolled, with stage 4 chronic kidney disease, with long-term current use of insulin ([place])   Coronary artery disease involving native coronary artery of native heart with angina pectoris (HCC)   Abnormal stress test   CKD (chronic kidney disease), stage III: followed by Dr. Jimmy Footman   CAD S/P mLAD PCI with DES   Presence of drug coated stent in LAD coronary artery   Allergies Allergies  Allergen Reactions  . Penicillin G Anaphylaxis, Itching, Swelling and Rash  . Penicillins Anaphylaxis, Itching, Swelling and Rash    Swelling of throat & whole mouth  Has patient had a PCN reaction causing immediate rash, facial/tongue/throat swelling, SOB or lightheadedness with hypotension: Yes Has patient had a PCN reaction causing severe rash involving mucus membranes or skin necrosis: No Has patient had a PCN reaction that required hospitalization: No Has patient had a PCN reaction occurring within the last 10 years: No If all of the above answers are "NO", then may proceed with Cephalosporin use.  [name] Lisinopril Cough    Diagnostic Studies/Procedures    LHC: 08/05/16  Conclusion     Prox RCA lesion, 50 %stenosed.  Mid RCA lesion, 60 %stenosed.  Mid LAD lesion, 80 %stenosed.  Post intervention, there is a 0% residual stenosis.  A stent was successfully placed.   IMPRESSION: Successful mid LAD PCI and drug-eluting stenting using a synergy drug-eluting stent reducing 80% stenosis to 0% residual. Patient had moderately not critical proximal mid dominant RCA stenosis which will be treated  medically. She had normal LV function by 2-D echocardiography. The LAD lesion corresponded to the area of abnormality on Myoview stress testing. She did have significant ostial left main spasm which ultimately responded to intracoronary nitroglycerin. The sheaths will be removed and pressure held on the groin to achieve hemostasis. She'll apply sterile condition. She'll be hydrated overnight, discharged home in the morning on triple antibiotic therapy. I will see her back in the office in the office in 2-3 weeks for follow-up.  Quay Burow. MD, [facility] _____________   History of Present Illness     Susan Horton is a 46 year old mild to moderately overweight single African-American female mother of 46, grandmother and 4 grandchildren who was seen by Kerin Ransom on 06/15/16 because of new onset chest pain. She has a history of treated hypertension, hyperlipidemia and diabetes which she's had for the last 22 years. She has CKD 3  with creatinine running in the mid 3 range followed by Dr. Jimmy Footman. She's noticed chest pain over the last 3-4 months occurring several times a week lasting minutes at a time worse with exertion. She had a Myoview stress test was read as intermediate risk with inferior ischemia. Based on this, the decision was made to proceed with outpatient radial diagnostic coronary angiography using limited contrast.   Hospital Course     Underwent LHC with Dr. Gwenlyn Found on 08/05/16 noted above with successful mLAD PCI and DES, and moderate mRCA stenosis that was treated medically. Normal EF reported. Post cath labs showed stable Cr 2.52, Hgb 9.1. No further chest pain reported. Worked well with cardiac rehab with no  complications. Noted to be mildly tachycardic and metoprolol was increased from 25mg  BID to 50mg  BID with improvement in HR.   She was seen by Dr. Ellyn Hack and determined stable for discharge home. Follow up in the office has been arranged. Medications are listed below.   _____________  Discharge Vitals Blood pressure 174/90, pulse 86, temperature 98 F (36.7 C), temperature source Oral, resp. rate 18, height 5\' 5"  (1.651 m), weight 170 lb 13.7 oz (77.5 kg), SpO2 100 %.  Filed Weights   08/05/16 1130 08/06/16 0305  Weight: 160 lb (72.6 kg) 170 lb 13.7 oz (77.5 kg)    Labs & Radiologic Studies    CBC  Recent Labs  08/06/16 0347  WBC 11.6*  HGB 9.1*  HCT 28.9*  MCV 77.1*  PLT 213   Basic Metabolic Panel  Recent Labs  08/06/16 0347  NA 133*  K 4.5  CL 106  CO2 19*  GLUCOSE 271*  BUN 30*  CREATININE 2.52*  CALCIUM 8.4*   Liver Function Tests No results for input(s): AST, ALT, ALKPHOS, BILITOT, PROT, ALBUMIN in the last 72 hours. No results for input(s): LIPASE, AMYLASE in the last 72 hours. Cardiac Enzymes No results for input(s): CKTOTAL, CKMB, CKMBINDEX, TROPONINI in the last 72 hours. BNP Invalid input(s): POCBNP D-Dimer No results for input(s): DDIMER in the last 72 hours. Hemoglobin A1C No results for input(s): HGBA1C in the last 72 hours. Fasting Lipid Panel No results for input(s): CHOL, HDL, LDLCALC, TRIG, CHOLHDL, LDLDIRECT in the last 72 hours. Thyroid Function Tests No results for input(s): TSH, T4TOTAL, T3FREE, THYROIDAB in the last 72 hours.  Invalid input(s): FREET3 _____________  No results found. Disposition   Pt is being discharged home today in good condition.  Follow-up Plans & Appointments    Follow-up Information    [name], MD Follow up on 08/13/2016.   Specialties:  Cardiology, Radiology Why:  at 10:45am for your follow up appt.  Contact information: [address] [place] [address] [phone]          Discharge Instructions    AMB Referral to Cardiac Rehabilitation - Phase II    Complete by:  As directed    Diagnosis:  Stable Angina   Amb Referral to Cardiac Rehabilitation    Complete by:  As directed    Diagnosis:  Coronary Stents   Call MD for:   redness, tenderness, or signs of infection (pain, swelling, redness, odor or green/yellow discharge around incision site)    Complete by:  As directed    Diet - low sodium heart healthy    Complete by:  As directed    Discharge instructions    Complete by:  As directed    Groin Site Care Refer to this sheet in the next few weeks. These instructions provide you with information on caring for yourself after your procedure. Your caregiver may also give you more specific instructions. Your treatment has been planned according to current medical practices, but problems sometimes occur. Call your caregiver if you have any problems or questions after your procedure. HOME CARE INSTRUCTIONS You may shower 24 hours after the procedure. Remove the bandage (dressing) and gently wash the site with plain soap and water. Gently pat the site dry.  Do not apply powder or lotion to the site.  Do not sit in a bathtub, swimming pool, or whirlpool for 5 to 7 days.  No bending, squatting, or lifting anything over 10 pounds (4.5 kg) as  directed by your caregiver.  Inspect the site at least twice daily.  Do not drive home if you are discharged the same day of the procedure. Have someone else drive you.  You may drive 24 hours after the procedure unless otherwise instructed by your caregiver.  What to expect: Any bruising will usually fade within 1 to 2 weeks.  Blood that collects in the tissue (hematoma) may be painful to the touch. It should usually decrease in size and tenderness within 1 to 2 weeks.  SEEK IMMEDIATE MEDICAL CARE IF: You have unusual pain at the groin site or down the affected leg.  You have redness, warmth, swelling, or pain at the groin site.  You have drainage (other than a small amount of blood on the dressing).  You have chills.  You have a fever or persistent symptoms for more than 72 hours.  You have a fever and your symptoms suddenly get worse.  Your leg becomes pale, cool, tingly, or  numb.  You have heavy bleeding from the site. Hold pressure on the site. .   Radial Site Care Refer to this sheet in the next few weeks. These instructions provide you with information on caring for yourself after your procedure. Your caregiver may also give you more specific instructions. Your treatment has been planned according to current medical practices, but problems sometimes occur. Call your caregiver if you have any problems or questions after your procedure. HOME CARE INSTRUCTIONS You may shower the day after the procedure.Remove the bandage (dressing) and gently wash the site with plain soap and water.Gently pat the site dry.  Do not apply powder or lotion to the site.  Do not submerge the affected site in water for 3 to 5 days.  Inspect the site at least twice daily.  Do not flex or bend the affected arm for 24 hours.  No lifting over 5 pounds (2.3 kg) for 5 days after your procedure.  Do not drive home if you are discharged the same day of the procedure. Have someone else drive you.  You may drive 24 hours after the procedure unless otherwise instructed by your caregiver.  What to expect: Any bruising will usually fade within 1 to 2 weeks.  Blood that collects in the tissue (hematoma) may be painful to the touch. It should usually decrease in size and tenderness within 1 to 2 weeks.  SEEK IMMEDIATE MEDICAL CARE IF: You have unusual pain at the radial site.  You have redness, warmth, swelling, or pain at the radial site.  You have drainage (other than a small amount of blood on the dressing).  You have chills.  You have a fever or persistent symptoms for more than 72 hours.  You have a fever and your symptoms suddenly get worse.  Your arm becomes pale, cool, tingly, or numb.  You have heavy bleeding from the site. Hold pressure on the site.    PLEASE DO NOT MISS ANY DOSES OF YOUR PLAVIX!!!!   Increase activity slowly    Complete by:  As directed       Discharge  Medications   Current Discharge Medication List    START taking these medications   Details  aspirin 81 MG chewable tablet Chew 1 tablet (81 mg total) by mouth daily. Qty: 30 tablet, Refills: 0    clopidogrel (PLAVIX) 75 MG tablet Take 1 tablet (75 mg total) by mouth daily with breakfast. Qty: 90 tablet, Refills: 2    nitroGLYCERIN (NITROSTAT) 0.4  MG SL tablet Place 1 tablet (0.4 mg total) under the tongue every 5 (five) minutes as needed. Qty: 25 tablet, Refills: 3    pantoprazole (PROTONIX) 40 MG tablet Take 1 tablet (40 mg total) by mouth daily. Qty: 30 tablet, Refills: 2      CONTINUE these medications which have CHANGED   Details  metoprolol tartrate (LOPRESSOR) 50 MG tablet Take 1 tablet (50 mg total) by mouth 2 (two) times daily. Qty: 60 tablet, Refills: 6   Associated Diagnoses: Essential hypertension, benign      CONTINUE these medications which have NOT CHANGED   Details  amLODipine (NORVASC) 10 MG tablet Take 1 tablet (10 mg total) by mouth daily. Qty: 90 tablet, Refills: 3   Associated Diagnoses: Essential hypertension, benign    Calcifediol ER (RAYALDEE) 30 MCG CPCR Take 1 capsule by mouth daily.    calcitRIOL (ROCALTROL) 0.25 MCG capsule Take 0.25 mcg by mouth daily. Refills: 6    Capsaicin-Menthol-Methyl Sal (CAPSAICIN-METHYL SAL-MENTHOL) 0.025-1-12 % CREA Apply 1 application topically 4 (four) times daily as needed. For aches/pains, apply to skin. May need gloves. Qty: 1 Tube, Refills: 3    DULoxetine (CYMBALTA) 30 MG capsule Take 1 capsule (30 mg total) by mouth daily. Qty: 30 capsule, Refills: 3   Associated Diagnoses: Adjustment disorder with depressed mood    Ferrous Sulfate (IRON) 325 (65 Fe) MG TABS Take 325 mg by mouth daily. Qty: 30 each, Refills: 3    Fluticasone-Salmeterol (ADVAIR) 250-50 MCG/DOSE AEPB Inhale 1 puff into the lungs 2 (two) times daily as needed (FOR SHORTNESS OF BREATH).     gabapentin (NEURONTIN) 300 MG capsule Take 2  capsules (600 mg total) by mouth 3 (three) times daily. Qty: 180 capsule, Refills: 3   Associated Diagnoses: Diabetic gastroparesis (HCC)    glipiZIDE (GLUCOTROL) 10 MG tablet Take 1 tablet (10 mg total) by mouth 2 (two) times daily before a meal. Qty: 60 tablet, Refills: 3   Associated Diagnoses: Uncontrolled type 2 diabetes mellitus with peripheral neuropathy (HCC)    Insulin Glargine (TOUJEO SOLOSTAR) 300 UNIT/ML SOPN Inject 50 Units into the skin 2 (two) times daily. Qty: 9 pen, Refills: 5   Associated Diagnoses: Uncontrolled type 2 diabetes mellitus with peripheral neuropathy (HCC)    insulin regular (NOVOLIN R,HUMULIN R) 250 units/2.19mL (100 units/mL) injection Inject 0.3 mLs (30 Units total) into the skin 3 (three) times daily before meals. Sliding scale Qty: 30 mL, Refills: 3    Insulin Syringe 27G X 1/2" 0.5 ML MISC 1 Syringe by Does not apply route 5 (five) times daily. Qty: 100 each, Refills: 0    losartan (COZAAR) 100 MG tablet Take 1 tablet (100 mg total) by mouth daily. Qty: 90 tablet, Refills: 3   Associated Diagnoses: Essential hypertension, benign    lovastatin (MEVACOR) 20 MG tablet Take 20 mg by mouth daily.    metoCLOPramide (REGLAN) 5 MG tablet Take 1 tablet (5 mg total) by mouth 4 (four) times daily -  before meals and at bedtime. Qty: 90 tablet, Refills: 3   Associated Diagnoses: Diabetic gastroparesis (HCC)    mirtazapine (REMERON SOL-TAB) 15 MG disintegrating tablet TAKE 1 TABLET BY MOUTH AT BEDTIME. Qty: 30 tablet, Refills: 1    montelukast (SINGULAIR) 10 MG tablet Take 10 mg by mouth daily as needed (allergies).     albuterol (PROVENTIL HFA;VENTOLIN HFA) 108 (90 BASE) MCG/ACT inhaler Inhale 1-2 puffs into the lungs every 6 (six) hours as needed for wheezing or shortness of breath.  STOP taking these medications     omeprazole (PRILOSEC) 40 MG capsule          Aspirin prescribed at discharge?  Yes High Intensity Statin Prescribed? (Lipitor  40-80mg  or Crestor 20-40mg ): No: Consider adding at follow up appt. On Lovastatin Beta Blocker Prescribed? Yes For EF <40%, was ACEI/ARB Prescribed? Yes ADP Receptor Inhibitor Prescribed? (i.e. Plavix etc.-Includes Medically Managed Patients): Yes For EF <40%, Aldosterone Inhibitor Prescribed? No: Ef ok Was EF assessed during THIS hospitalization? Yes Was Cardiac Rehab II ordered? (Included Medically managed Patients): Yes   Outstanding Labs/Studies   N/a   Duration of Discharge Encounter   Greater than 30 minutes including physician time.  Signed, Reino Bellis NP-C 08/06/2016, 10:09 AM

## 2016-08-06 NOTE — Telephone Encounter (Signed)
TCM-Phone Call-Please Call Pt-Appt on 08-13-16 with Dr Gwenlyn Found at 10:45.

## 2016-08-06 NOTE — Progress Notes (Signed)
CARDIAC REHAB PHASE I   PRE:  Rate/Rhythm: 116ST  BP:  Supine:   Sitting: 174/90  Standing:    SaO2:   MODE:  Ambulation: 300 ft   POST:  Rate/Rhythm: 124 ST  BP:  Supine:   Sitting: 184/98  Standing:    SaO2: 100%RA 0815-0905 Pt walked 300 ft on RA with slow steady gait. Groin a little uncomfortable during walk but no CP or SOB. Pt stated her mobility is limited by neuropathy. Reviewed importance of plavix with stent. Reviewed NTG use, carb counting and watching sodium, risk factors, smoking cessation, ex ed and CRP 2. Pt stated she quit smoking for two years by cutting down gradually. She is down from 1 and 1/2 ppd to one or two every few days. Gave fake cigarette and smoking cessation handout. Pt plans to quit entirely. Discussed CRP 2 and will refer to [facility]. Pt's heart rate and BP elevated this morning.   Graylon Good, RN BSN  08/06/2016 9:00 AM

## 2016-08-06 NOTE — Progress Notes (Addendum)
Inpatient Diabetes Program Recommendations  AACE/ADA: New Consensus Statement on Inpatient Glycemic Control (2015)  Target Ranges:  Prepandial:   less than 140 mg/dL      Peak postprandial:   less than 180 mg/dL (1-2 hours)      Critically ill patients:  140 - 180 mg/dL   Lab Results  Component Value Date   GLUCAP 353 (H) 08/06/2016   HGBA1C 14.5 05/26/2016    Review of Glycemic Control Results for Susan Horton, Susan Horton (MRN [id number]) as of 08/06/2016 09:53  Ref. Range 08/05/2016 11:28 08/05/2016 21:11 08/06/2016 06:05  Glucose-Capillary Latest Ref Range: 65 - 99 mg/dL 336 (H) 127 (H) 353 (H)   Diabetes history: DM2 Outpatient Diabetes medications: Toujeo (Glargine) 50 units bid + Humulin R 30 units tid meal coverage + Glucotrol 10 mg bid Current orders for Inpatient glycemic control: Glucotrol 10 mg bid  Inpatient Diabetes Program Recommendations:  Please consider restarting basal insulin Lantus @ least 50% home dose + Novolog 5 units meal coverage tid if eats 50%.Will follow. 10:15 am. Spoke with pt @ bedside A1C results 14.5 from 05/26/16 with them and explained what an A1C is, basic pathophysiology of DM Type 2, basic home care, basic diabetes diet nutrition principles, importance of checking CBGs and maintaining good CBG control to prevent long-term and short-term complications. Reviewed signs and symptoms of hyperglycemia and hypoglycemia and how to treat hypoglycemia at home. Also reviewed blood sugar goals at home.  RNs to provide ongoing basic DM education at bedside with this patient. Reviewed foods to limit to decrease CBGs. Patient states missed appt. With endocrinologist Dr. Cruzita Lederer Monday due to emergency with her grandson. Requested patient to make followup appt. As soon as possible after discharge and pt. States she is currently working with her doctor to adjust insulin doses to decrease CBGs.  Thank you, Susan Horton. Susan Mcinroy, RN, MSN, CDE  Diabetes Coordinator Inpatient Glycemic Control  Team Team Pager [phone] (8am-5pm) 08/06/2016 9:54 AM

## 2016-08-09 ENCOUNTER — Telehealth (HOSPITAL_COMMUNITY): Payer: Self-pay

## 2016-08-09 NOTE — Telephone Encounter (Signed)
Patient contacted regarding discharge from [facility] 08/06/16.  Patient understands to follow up with provider BERRY on 08/13/16 at 10:45 AM at [facility]. Patient understands discharge instructions?yes  Patient understands medications and regiment? yes  Patient understands to bring all medications to this visit? yes    Patient  States lower lack is bothering her. She states the issue was present in hospital. Recommend patient to contact primary.  No issue when  back pain when voiding. Patient verbalized understanding.

## 2016-08-09 NOTE — Telephone Encounter (Signed)
I called and spoke to patient about insurance. Patient stated she just renewed her orange card and it is effective until 12/2016. Patient informed our office does not accepts the orange card. Patient also stated that she had applied for Medicaid in the past and was denied.  Patient offered our maintenance program which is self pay @ $68.00 a month. Patient stated that she does not have any income and is unable to afford it.

## 2016-08-10 ENCOUNTER — Other Ambulatory Visit: Payer: Self-pay | Admitting: Internal Medicine

## 2016-08-10 DIAGNOSIS — Z1239 Encounter for other screening for malignant neoplasm of breast: Secondary | ICD-10-CM

## 2016-08-10 DIAGNOSIS — K029 Dental caries, unspecified: Secondary | ICD-10-CM

## 2016-08-10 NOTE — Telephone Encounter (Signed)
Ordered

## 2016-08-13 ENCOUNTER — Ambulatory Visit (INDEPENDENT_AMBULATORY_CARE_PROVIDER_SITE_OTHER): Payer: Self-pay | Admitting: Cardiovascular Disease

## 2016-08-13 ENCOUNTER — Encounter: Payer: Self-pay | Admitting: Cardiovascular Disease

## 2016-08-13 VITALS — BP 120/78 | HR 100 | Ht 65.0 in | Wt 169.0 lb

## 2016-08-13 DIAGNOSIS — Z9861 Coronary angioplasty status: Secondary | ICD-10-CM

## 2016-08-13 DIAGNOSIS — I2510 Atherosclerotic heart disease of native coronary artery without angina pectoris: Secondary | ICD-10-CM

## 2016-08-13 DIAGNOSIS — F17200 Nicotine dependence, unspecified, uncomplicated: Secondary | ICD-10-CM

## 2016-08-13 DIAGNOSIS — E785 Hyperlipidemia, unspecified: Secondary | ICD-10-CM

## 2016-08-13 DIAGNOSIS — I10 Essential (primary) hypertension: Secondary | ICD-10-CM

## 2016-08-13 MED ORDER — METOPROLOL TARTRATE 50 MG PO TABS: 50.0000 mg | ORAL_TABLET | Freq: Two times a day (BID) | ORAL | 6 refills | Status: DC

## 2016-08-13 MED ORDER — METOPROLOL TARTRATE 25 MG PO TABS: 25.0000 mg | ORAL_TABLET | Freq: Two times a day (BID) | ORAL | 3 refills | Status: DC

## 2016-08-13 MED FILL — METOPROLOL TARTRATE 25 MG T: 25 | 30 days supply | Qty: 60 | Fill #0

## 2016-08-13 MED FILL — ?METOPROLOL 50 MG TABLET: 50 | 30 days supply | Qty: 60 | Fill #0

## 2016-08-13 NOTE — Progress Notes (Signed)
08/13/2016 Susan Horton   March 31, 1970  [id number]  Primary Physician [name], MD Primary Cardiologist: [name] MD FACP, [place], [place], [place]  HPI:  Susan Horton is a 46 y.o. moderately overweight single African-American female mother of 64, grandmother and 4 grandchildren who was seen by Kerin Ransom on 06/15/16 because of new onset chest pain. I last saw her in the office 07/20/16 She has a history of treated hypertension, hyperlipidemia and diabetes which she's had for the last 22 years. She has CKD 3 with creatinine running in the mid 3 range followed by Dr. Jimmy Footman. She's noticed chest pain over the last 3-4 months occurring several times a week lasting minutes at a time worse with exertion. She had a Myoview stress test was read as intermediate risk with inferior ischemia. Based on this, I decided to proceed with outpatient radial diagnostic coronary angiography using limited contrast. This was performed 08/05/16 the right femoral approach revealed an 80% mid LAD which was stented using a synergy drug-eluting stent reducing 80% stenosis to 0% residual. She did have moderate mid RCA stenosis which was not addressed. She had left main spasm probably related to the guide catheter doing a procedure that responded to intra-coronary nitroglycerin. Her chest pain has resolved and she has stopped smoking since. She did have carotid Dopplers performed because of bruits that showed mild to moderate left ICA stenosis which will be followed on an annual basis.   Current Meds  Medication Sig  . albuterol (PROVENTIL HFA;VENTOLIN HFA) 108 (90 BASE) MCG/ACT inhaler Inhale 1-2 puffs into the lungs every 6 (six) hours as needed for wheezing or shortness of breath.   [name] amLODipine (NORVASC) 10 MG tablet Take 1 tablet (10 mg total) by mouth daily.  [name] aspirin 81 MG chewable tablet Chew 1 tablet (81 mg total) by mouth daily.  . Calcifediol ER (RAYALDEE) 30 MCG CPCR Take 1 capsule by mouth daily.    . calcitRIOL (ROCALTROL) 0.25 MCG capsule Take 0.25 mcg by mouth daily.  . Capsaicin-Menthol-Methyl Sal (CAPSAICIN-METHYL SAL-MENTHOL) 0.025-1-12 % CREA Apply 1 application topically 4 (four) times daily as needed. For aches/pains, apply to skin. May need gloves. (Patient taking differently: Apply 1 application topically 4 (four) times daily as needed (for aches or pains (may need gloves)). )  . clopidogrel (PLAVIX) 75 MG tablet Take 1 tablet (75 mg total) by mouth daily with breakfast.  . DULoxetine (CYMBALTA) 30 MG capsule Take 1 capsule (30 mg total) by mouth daily. (Patient taking differently: Take 30 mg by mouth every evening. )  . Ferrous Sulfate (IRON) 325 (65 Fe) MG TABS Take 325 mg by mouth daily.  . Fluticasone-Salmeterol (ADVAIR) 250-50 MCG/DOSE AEPB Inhale 1 puff into the lungs 2 (two) times daily as needed (FOR SHORTNESS OF BREATH).   [name] gabapentin (NEURONTIN) 300 MG capsule Take 2 capsules (600 mg total) by mouth 3 (three) times daily.  [name] glipiZIDE (GLUCOTROL) 10 MG tablet Take 1 tablet (10 mg total) by mouth 2 (two) times daily before a meal.  . Insulin Glargine (TOUJEO SOLOSTAR) 300 UNIT/ML SOPN Inject 50 Units into the skin 2 (two) times daily. (Patient taking differently: Inject 50 Units into the skin 2 (two) times daily after a meal. )  . insulin regular (NOVOLIN R,HUMULIN R) 250 units/2.53mL (100 units/mL) injection Inject 0.3 mLs (30 Units total) into the skin 3 (three) times daily before meals. Sliding scale  . Insulin Syringe 27G X 1/2" 0.5 ML MISC 1  Syringe by Does not apply route 5 (five) times daily.  [name] losartan (COZAAR) 100 MG tablet Take 1 tablet (100 mg total) by mouth daily.  [name] lovastatin (MEVACOR) 20 MG tablet Take 20 mg by mouth daily.  . metoCLOPramide (REGLAN) 5 MG tablet Take 1 tablet (5 mg total) by mouth 4 (four) times daily -  before meals and at bedtime.  . metoprolol tartrate (LOPRESSOR) 50 MG tablet Take 1 tablet (50 mg total) by mouth 2 (two) times daily. Along  with one 25 mg tablet to equal 75 mg twice daily.  . mirtazapine (REMERON SOL-TAB) 15 MG disintegrating tablet TAKE 1 TABLET BY MOUTH AT BEDTIME.  . montelukast (SINGULAIR) 10 MG tablet Take 10 mg by mouth daily as needed (allergies).   . nitroGLYCERIN (NITROSTAT) 0.4 MG SL tablet Place 1 tablet (0.4 mg total) under the tongue every 5 (five) minutes as needed.  . pantoprazole (PROTONIX) 40 MG tablet Take 1 tablet (40 mg total) by mouth daily.  . [DISCONTINUED] metoprolol tartrate (LOPRESSOR) 50 MG tablet Take 1 tablet (50 mg total) by mouth 2 (two) times daily.     Allergies  Allergen Reactions  . Penicillin G Anaphylaxis, Itching, Swelling and Rash  . Penicillins Anaphylaxis, Itching, Swelling and Rash    Swelling of throat & whole mouth  Has patient had a PCN reaction causing immediate rash, facial/tongue/throat swelling, SOB or lightheadedness with hypotension: Yes Has patient had a PCN reaction causing severe rash involving mucus membranes or skin necrosis: No Has patient had a PCN reaction that required hospitalization: No Has patient had a PCN reaction occurring within the last 10 years: No If all of the above answers are "NO", then may proceed with Cephalosporin use.  [name] Lisinopril Cough    Social History   Social History  . Marital status: Single    Spouse name: N/A  . Number of children: 3  . Years of education: 14   Occupational History  . Corrections officer    Social History Main Topics  . Smoking status: Former Smoker    Packs/day: 0.10    Years: 20.00    Types: Cigarettes  . Smokeless tobacco: Never Used  . Alcohol use No  . Drug use: No  . Sexual activity: Not on file   Other Topics Concern  . Not on file   Social History Narrative   Lives with daughter in a 2 story home.  Has 3 children.  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  Education: college.      Review of Systems: General: negative for chills, fever, night sweats or weight  changes.  Cardiovascular: negative for chest pain, dyspnea on exertion, edema, orthopnea, palpitations, paroxysmal nocturnal dyspnea or shortness of breath Dermatological: negative for rash Respiratory: negative for cough or wheezing Urologic: negative for hematuria Abdominal: negative for nausea, vomiting, diarrhea, bright red blood per rectum, melena, or hematemesis Neurologic: negative for visual changes, syncope, or dizziness All other systems reviewed and are otherwise negative except as noted above.    Blood pressure 120/78, pulse 100, height 5\' 5"  (1.651 m), weight 169 lb (76.7 kg).  General appearance: alert and no distress Neck: no adenopathy, no JVD, supple, symmetrical, trachea midline, thyroid not enlarged, symmetric, no tenderness/mass/nodules and Soft bilateral carotid bruits Lungs: clear to auscultation bilaterally Heart: regular rate and rhythm, S1, S2 normal, no murmur, click, rub or gallop Extremities: extremities normal, atraumatic, no cyanosis or edema  EKG not performed today  ASSESSMENT AND  PLAN:   CAD S/P mLAD PCI with DES History of CAD with progressive angina, Myoview stress test was remarkable for inferior ischemia and subsequent cardiac catheterization which I performed 08/05/16 through the right femoral approach revealing a high-grade mid LAD lesion stented using a 2.5 x 16 mm long synergy drug-eluting stent along with moderate mid RCA stenosis. She did have ostial left main spasm which ultimately responded slowly to intracoronary nitroglycerin glycerin. Her chest pain has since resolved. She did have severe renal insufficiency which was not adversely affected by the contrast used for the cardiac catheterization. She is on dual antiplatelet therapy.  Essential hypertension, benign History of essential hypertension blood pressure measured 120/78. Her heart rate is 100. I'm going to increase her metoprolol from 50-75 mg twice a day. She is also on  amlodipine.  Carotid bruit present History of bilateral carotid bruits with Doppler performed 07/13/16 that showed mild to moderate left ICA stenosis which will be repeated on an annual basis  Dyslipidemia History of dyslipidemia on statin therapy. We will recheck a lipid and liver profile  Smoker History of recently discontinued tobacco abuse      [name] MD [facility], [facility] 08/13/2016 11:09 AM

## 2016-08-13 NOTE — Assessment & Plan Note (Signed)
History of bilateral carotid bruits with Doppler performed 07/13/16 that showed mild to moderate left ICA stenosis which will be repeated on an annual basis

## 2016-08-13 NOTE — Assessment & Plan Note (Signed)
History of dyslipidemia on statin therapy.  We will recheck a lipid and liver profile. 

## 2016-08-13 NOTE — Assessment & Plan Note (Signed)
History of CAD with progressive angina, Myoview stress test was remarkable for inferior ischemia and subsequent cardiac catheterization which I performed 08/05/16 through the right femoral approach revealing a high-grade mid LAD lesion stented using a 2.5 x 16 mm long synergy drug-eluting stent along with moderate mid RCA stenosis. She did have ostial left main spasm which ultimately responded slowly to intracoronary nitroglycerin glycerin. Her chest pain has since resolved. She did have severe renal insufficiency which was not adversely affected by the contrast used for the cardiac catheterization. She is on dual antiplatelet therapy.

## 2016-08-13 NOTE — Assessment & Plan Note (Signed)
History of essential hypertension blood pressure measured 120/78. Her heart rate is 100. I'm going to increase her metoprolol from 50-75 mg twice a day. She is also on amlodipine.

## 2016-08-13 NOTE — Patient Instructions (Signed)
Medication Instructions: Your physician recommends that you continue on your current medications as directed. Please refer to the Current Medication list given to you today.  START Metoprolol 25 mg---Take along with your Metoprolol 50 mg tablet to equal 75 mg twice daily.   Labwork: Your physician recommends that you return for a FASTING lipid profile and hepatic function panel.   Follow-Up: Your physician wants you to follow-up in: 6 months with Dr. Gwenlyn Found. You will receive a reminder letter in the mail two months in advance. If you don't receive a letter, please call our office to schedule the follow-up appointment.  If you need a refill on your cardiac medications before your next appointment, please call your pharmacy.

## 2016-08-13 NOTE — Assessment & Plan Note (Signed)
History of recently discontinued tobacco abuse

## 2016-08-20 ENCOUNTER — Encounter (INDEPENDENT_AMBULATORY_CARE_PROVIDER_SITE_OTHER): Admitting: Ophthalmology

## 2016-08-25 ENCOUNTER — Ambulatory Visit: Payer: Self-pay | Admitting: Internal Medicine

## 2016-08-26 NOTE — Telephone Encounter (Signed)
Patient verified DOB Patient was provided [facility]. Patient is also aware of dental referral being placed as well. Patient was transferred to the front office to reschedule her missed appointment on 08/25/16. No further questions at this time.

## 2016-08-29 ENCOUNTER — Encounter (HOSPITAL_COMMUNITY): Payer: Self-pay | Admitting: Emergency Medicine

## 2016-08-29 ENCOUNTER — Emergency Department (HOSPITAL_COMMUNITY): Payer: Medicaid Other

## 2016-08-29 ENCOUNTER — Observation Stay (HOSPITAL_COMMUNITY)
Admission: EM | Admit: 2016-08-29 | Discharge: 2016-08-30 | Disposition: A | Discharge status: Home / Self Care | Payer: Medicaid Other | Attending: Internal Medicine | Admitting: Internal Medicine

## 2016-08-29 DIAGNOSIS — K5900 Constipation, unspecified: Secondary | ICD-10-CM | POA: Diagnosis not present

## 2016-08-29 DIAGNOSIS — E785 Hyperlipidemia, unspecified: Secondary | ICD-10-CM | POA: Diagnosis not present

## 2016-08-29 DIAGNOSIS — F17210 Nicotine dependence, cigarettes, uncomplicated: Secondary | ICD-10-CM | POA: Diagnosis not present

## 2016-08-29 DIAGNOSIS — G43909 Migraine, unspecified, not intractable, without status migrainosus: Secondary | ICD-10-CM | POA: Diagnosis not present

## 2016-08-29 DIAGNOSIS — K3184 Gastroparesis: Secondary | ICD-10-CM | POA: Insufficient documentation

## 2016-08-29 DIAGNOSIS — Z794 Long term (current) use of insulin: Secondary | ICD-10-CM | POA: Insufficient documentation

## 2016-08-29 DIAGNOSIS — Z880 Allergy status to penicillin: Secondary | ICD-10-CM | POA: Diagnosis not present

## 2016-08-29 DIAGNOSIS — L732 Hidradenitis suppurativa: Secondary | ICD-10-CM | POA: Diagnosis not present

## 2016-08-29 DIAGNOSIS — E1043 Type 1 diabetes mellitus with diabetic autonomic (poly)neuropathy: Principal | ICD-10-CM | POA: Insufficient documentation

## 2016-08-29 DIAGNOSIS — Z9049 Acquired absence of other specified parts of digestive tract: Secondary | ICD-10-CM | POA: Diagnosis not present

## 2016-08-29 DIAGNOSIS — Z79899 Other long term (current) drug therapy: Secondary | ICD-10-CM | POA: Insufficient documentation

## 2016-08-29 DIAGNOSIS — Z7982 Long term (current) use of aspirin: Secondary | ICD-10-CM | POA: Insufficient documentation

## 2016-08-29 DIAGNOSIS — Z90710 Acquired absence of both cervix and uterus: Secondary | ICD-10-CM | POA: Diagnosis not present

## 2016-08-29 DIAGNOSIS — E1142 Type 2 diabetes mellitus with diabetic polyneuropathy: Secondary | ICD-10-CM

## 2016-08-29 DIAGNOSIS — D631 Anemia in chronic kidney disease: Secondary | ICD-10-CM | POA: Diagnosis not present

## 2016-08-29 DIAGNOSIS — E1165 Type 2 diabetes mellitus with hyperglycemia: Secondary | ICD-10-CM

## 2016-08-29 DIAGNOSIS — I10 Essential (primary) hypertension: Secondary | ICD-10-CM | POA: Diagnosis present

## 2016-08-29 DIAGNOSIS — Z888 Allergy status to other drugs, medicaments and biological substances status: Secondary | ICD-10-CM | POA: Diagnosis not present

## 2016-08-29 DIAGNOSIS — IMO0002 Reserved for concepts with insufficient information to code with codable children: Secondary | ICD-10-CM

## 2016-08-29 DIAGNOSIS — R072 Precordial pain: Secondary | ICD-10-CM

## 2016-08-29 DIAGNOSIS — N184 Chronic kidney disease, stage 4 (severe): Secondary | ICD-10-CM | POA: Diagnosis present

## 2016-08-29 DIAGNOSIS — I25119 Atherosclerotic heart disease of native coronary artery with unspecified angina pectoris: Secondary | ICD-10-CM

## 2016-08-29 DIAGNOSIS — E1122 Type 2 diabetes mellitus with diabetic chronic kidney disease: Secondary | ICD-10-CM | POA: Insufficient documentation

## 2016-08-29 DIAGNOSIS — J45909 Unspecified asthma, uncomplicated: Secondary | ICD-10-CM | POA: Diagnosis not present

## 2016-08-29 DIAGNOSIS — G8929 Other chronic pain: Secondary | ICD-10-CM | POA: Insufficient documentation

## 2016-08-29 DIAGNOSIS — I2510 Atherosclerotic heart disease of native coronary artery without angina pectoris: Secondary | ICD-10-CM | POA: Diagnosis present

## 2016-08-29 DIAGNOSIS — I129 Hypertensive chronic kidney disease with stage 1 through stage 4 chronic kidney disease, or unspecified chronic kidney disease: Secondary | ICD-10-CM | POA: Insufficient documentation

## 2016-08-29 DIAGNOSIS — E1159 Type 2 diabetes mellitus with other circulatory complications: Secondary | ICD-10-CM

## 2016-08-29 DIAGNOSIS — Z86718 Personal history of other venous thrombosis and embolism: Secondary | ICD-10-CM | POA: Diagnosis not present

## 2016-08-29 DIAGNOSIS — Z7902 Long term (current) use of antithrombotics/antiplatelets: Secondary | ICD-10-CM | POA: Insufficient documentation

## 2016-08-29 DIAGNOSIS — R079 Chest pain, unspecified: Secondary | ICD-10-CM | POA: Insufficient documentation

## 2016-08-29 DIAGNOSIS — F39 Unspecified mood [affective] disorder: Secondary | ICD-10-CM | POA: Insufficient documentation

## 2016-08-29 DIAGNOSIS — K219 Gastro-esophageal reflux disease without esophagitis: Secondary | ICD-10-CM | POA: Diagnosis not present

## 2016-08-29 DIAGNOSIS — Z8249 Family history of ischemic heart disease and other diseases of the circulatory system: Secondary | ICD-10-CM | POA: Insufficient documentation

## 2016-08-29 DIAGNOSIS — Z955 Presence of coronary angioplasty implant and graft: Secondary | ICD-10-CM | POA: Diagnosis not present

## 2016-08-29 DIAGNOSIS — B373 Candidiasis of vulva and vagina: Secondary | ICD-10-CM | POA: Diagnosis not present

## 2016-08-29 HISTORY — DX: Atherosclerotic heart disease of native coronary artery without angina pectoris: I25.10

## 2016-08-29 HISTORY — DX: Type 2 diabetes mellitus without complications: E11.9

## 2016-08-29 HISTORY — DX: Migraine, unspecified, not intractable, without status migrainosus: G43.909

## 2016-08-29 HISTORY — DX: Chronic kidney disease, stage 4 (severe): N18.4

## 2016-08-29 HISTORY — DX: Essential (primary) hypertension: I10

## 2016-08-29 LAB — URINALYSIS, ROUTINE W REFLEX MICROSCOPIC
BILIRUBIN URINE: NEGATIVE
Glucose, UA: >=500 mg/dL — AB
HGB URINE DIPSTICK: NEGATIVE
Ketones, ur: NEGATIVE mg/dL
Leukocytes, UA: NEGATIVE
NITRITE: NEGATIVE
PROTEIN: 100 mg/dL — AB
Specific Gravity, Urine: 1.014 (ref 1.005–1.030)
pH: 6 (ref 5.0–8.0)

## 2016-08-29 LAB — HEPATIC FUNCTION PANEL
ALBUMIN: 2.7 g/dL — AB (ref 3.5–5.0)
ALK PHOS: 135 U/L — AB (ref 38–126)
ALT: 8 U/L — ABNORMAL LOW (ref 14–54)
AST: 9 U/L — AB (ref 15–41)
Bilirubin, Direct: <0.1 mg/dL — ABNORMAL LOW (ref 0.1–0.5)
TOTAL PROTEIN: 7.2 g/dL (ref 6.5–8.1)
Total Bilirubin: 0.4 mg/dL (ref 0.3–1.2)

## 2016-08-29 LAB — LIPASE, BLOOD: LIPASE: 39 U/L (ref 11–51)

## 2016-08-29 LAB — I-STAT TROPONIN, ED
TROPONIN I, POC: 0 ng/mL (ref 0.00–0.08)
TROPONIN I, POC: 0 ng/mL (ref 0.00–0.08)

## 2016-08-29 LAB — CBC
HEMATOCRIT: 29 % — AB (ref 36.0–46.0)
HEMATOCRIT: 28.1 % — AB (ref 36.0–46.0)
HEMOGLOBIN: 9.2 g/dL — AB (ref 12.0–15.0)
HEMOGLOBIN: 8.9 g/dL — AB (ref 12.0–15.0)
MCH: 24.5 pg — AB (ref 26.0–34.0)
MCH: 24.5 pg — ABNORMAL LOW (ref 26.0–34.0)
MCHC: 31.7 g/dL (ref 30.0–36.0)
MCHC: 31.7 g/dL (ref 30.0–36.0)
MCV: 77.1 fL — ABNORMAL LOW (ref 78.0–100.0)
MCV: 77.4 fL — ABNORMAL LOW (ref 78.0–100.0)
Platelets: 478 10*3/uL — ABNORMAL HIGH (ref 150–400)
Platelets: 476 10*3/uL — ABNORMAL HIGH (ref 150–400)
RBC: 3.76 MIL/uL — AB (ref 3.87–5.11)
RBC: 3.63 MIL/uL — AB (ref 3.87–5.11)
RDW: 13.1 % (ref 11.5–15.5)
RDW: 13 % (ref 11.5–15.5)
WBC: 9.9 10*3/uL (ref 4.0–10.5)
WBC: 11.3 10*3/uL — AB (ref 4.0–10.5)

## 2016-08-29 LAB — TROPONIN I
Troponin I: <0.03 ng/mL (ref <0.03)
Troponin I: <0.03 ng/mL (ref <0.03)

## 2016-08-29 LAB — CBG MONITORING, ED: Glucose-Capillary: 428 mg/dL — ABNORMAL HIGH (ref 65–99)

## 2016-08-29 LAB — CREATININE, SERUM
Creatinine, Ser: 2.94 mg/dL — ABNORMAL HIGH (ref 0.44–1.00)
GFR, EST AFRICAN AMERICAN: 21 mL/min — AB (ref >60)
GFR, EST NON AFRICAN AMERICAN: 18 mL/min — AB (ref >60)

## 2016-08-29 LAB — BASIC METABOLIC PANEL
ANION GAP: 7 (ref 5–15)
BUN: 41 mg/dL — ABNORMAL HIGH (ref 6–20)
CALCIUM: 8.6 mg/dL — AB (ref 8.9–10.3)
CHLORIDE: 103 mmol/L (ref 101–111)
CO2: 21 mmol/L — AB (ref 22–32)
Creatinine, Ser: 2.75 mg/dL — ABNORMAL HIGH (ref 0.44–1.00)
GFR calc non Af Amer: 20 mL/min — ABNORMAL LOW (ref >60)
GFR, EST AFRICAN AMERICAN: 23 mL/min — AB (ref >60)
GLUCOSE: 459 mg/dL — AB (ref 65–99)
POTASSIUM: 4.6 mmol/L (ref 3.5–5.1)
Sodium: 131 mmol/L — ABNORMAL LOW (ref 135–145)

## 2016-08-29 LAB — GLUCOSE, CAPILLARY: Glucose-Capillary: 455 mg/dL — ABNORMAL HIGH (ref 65–99)

## 2016-08-29 MED ORDER — INSULIN GLARGINE 100 UNIT/ML ~~LOC~~ SOLN
50.0000 [IU] | Freq: Once | SUBCUTANEOUS | Status: AC
  Administered 2016-08-29: 50 [IU] via SUBCUTANEOUS
  Filled 2016-08-29: qty 0.5

## 2016-08-29 MED ORDER — ONDANSETRON HCL 4 MG/2ML IJ SOLN: 4.0000 mg | Freq: Four times a day (QID) | INTRAMUSCULAR | Status: DC | PRN

## 2016-08-29 MED ORDER — HYDRALAZINE HCL 20 MG/ML IJ SOLN: 10.0000 mg | Freq: Three times a day (TID) | INTRAMUSCULAR | Status: DC | PRN

## 2016-08-29 MED ORDER — METOPROLOL TARTRATE 50 MG PO TABS
75.0000 mg | ORAL_TABLET | Freq: Two times a day (BID) | ORAL | Status: DC
  Administered 2016-08-29 – 2016-08-30 (×2): 75 mg via ORAL
  Filled 2016-08-29 (×2): qty 1

## 2016-08-29 MED ORDER — INSULIN ASPART 100 UNIT/ML ~~LOC~~ SOLN
0.0000 [IU] | Freq: Three times a day (TID) | SUBCUTANEOUS | Status: DC
  Administered 2016-08-30: 5 [IU] via SUBCUTANEOUS
  Administered 2016-08-30: 3 [IU] via SUBCUTANEOUS

## 2016-08-29 MED ORDER — ALBUTEROL SULFATE (2.5 MG/3ML) 0.083% IN NEBU: 2.5000 mg | INHALATION_SOLUTION | Freq: Four times a day (QID) | RESPIRATORY_TRACT | Status: DC | PRN

## 2016-08-29 MED ORDER — GABAPENTIN 300 MG PO CAPS
600.0000 mg | ORAL_CAPSULE | Freq: Three times a day (TID) | ORAL | Status: DC
  Administered 2016-08-29 – 2016-08-30 (×3): 600 mg via ORAL
  Filled 2016-08-29 (×5): qty 2

## 2016-08-29 MED ORDER — METOPROLOL TARTRATE 25 MG PO TABS
50.0000 mg | ORAL_TABLET | Freq: Once | ORAL | Status: AC
  Administered 2016-08-29: 50 mg via ORAL
  Filled 2016-08-29: qty 2

## 2016-08-29 MED ORDER — METOPROLOL TARTRATE 25 MG PO TABS: 25.0000 mg | ORAL_TABLET | Freq: Two times a day (BID) | ORAL | Status: DC

## 2016-08-29 MED ORDER — NITROGLYCERIN 0.4 MG SL SUBL
0.4000 mg | SUBLINGUAL_TABLET | SUBLINGUAL | Status: AC | PRN
  Administered 2016-08-29 (×2): 0.4 mg via SUBLINGUAL
  Filled 2016-08-29 (×2): qty 1

## 2016-08-29 MED ORDER — CALCIFEDIOL ER 30 MCG PO CPCR: 30.0000 ug | ORAL_CAPSULE | Freq: Every day | ORAL | Status: DC

## 2016-08-29 MED ORDER — MIRTAZAPINE 15 MG PO TBDP
15.0000 mg | ORAL_TABLET | Freq: Every day | ORAL | Status: DC
  Administered 2016-08-29: 15 mg via ORAL
  Filled 2016-08-29 (×3): qty 1

## 2016-08-29 MED ORDER — INSULIN ASPART 100 UNIT/ML ~~LOC~~ SOLN
15.0000 [IU] | Freq: Once | SUBCUTANEOUS | Status: AC
  Administered 2016-08-29: 15 [IU] via SUBCUTANEOUS
  Filled 2016-08-29: qty 1

## 2016-08-29 MED ORDER — CLOPIDOGREL BISULFATE 75 MG PO TABS
75.0000 mg | ORAL_TABLET | Freq: Every day | ORAL | Status: DC
  Administered 2016-08-30: 75 mg via ORAL
  Filled 2016-08-29: qty 1

## 2016-08-29 MED ORDER — METOCLOPRAMIDE HCL 5 MG PO TABS
5.0000 mg | ORAL_TABLET | Freq: Three times a day (TID) | ORAL | Status: DC
  Administered 2016-08-29 – 2016-08-30 (×3): 5 mg via ORAL
  Filled 2016-08-29 (×3): qty 1

## 2016-08-29 MED ORDER — CLOPIDOGREL BISULFATE 75 MG PO TABS
75.0000 mg | ORAL_TABLET | Freq: Once | ORAL | Status: AC
  Administered 2016-08-29: 75 mg via ORAL
  Filled 2016-08-29: qty 1

## 2016-08-29 MED ORDER — NITROGLYCERIN 0.4 MG SL SUBL
0.4000 mg | SUBLINGUAL_TABLET | Freq: Once | SUBLINGUAL | Status: AC
  Administered 2016-08-29: 0.4 mg via SUBLINGUAL
  Filled 2016-08-29: qty 1

## 2016-08-29 MED ORDER — INSULIN GLARGINE 100 UNIT/ML ~~LOC~~ SOLN
50.0000 [IU] | Freq: Two times a day (BID) | SUBCUTANEOUS | Status: DC
  Administered 2016-08-29 – 2016-08-30 (×2): 50 [IU] via SUBCUTANEOUS
  Filled 2016-08-29 (×3): qty 0.5

## 2016-08-29 MED ORDER — LOSARTAN POTASSIUM 50 MG PO TABS
100.0000 mg | ORAL_TABLET | Freq: Every day | ORAL | Status: DC
  Administered 2016-08-29 – 2016-08-30 (×2): 100 mg via ORAL
  Filled 2016-08-29 (×2): qty 2

## 2016-08-29 MED ORDER — AMLODIPINE BESYLATE 10 MG PO TABS
10.0000 mg | ORAL_TABLET | Freq: Every day | ORAL | Status: DC
  Administered 2016-08-30: 10 mg via ORAL
  Filled 2016-08-29: qty 1

## 2016-08-29 MED ORDER — CALCITRIOL 0.25 MCG PO CAPS
0.2500 ug | ORAL_CAPSULE | Freq: Every day | ORAL | Status: DC
  Administered 2016-08-30: 0.25 ug via ORAL
  Filled 2016-08-29: qty 1

## 2016-08-29 MED ORDER — DULOXETINE HCL 30 MG PO CPEP
30.0000 mg | ORAL_CAPSULE | Freq: Every evening | ORAL | Status: DC
  Administered 2016-08-29: 30 mg via ORAL
  Filled 2016-08-29: qty 1

## 2016-08-29 MED ORDER — PRAVASTATIN SODIUM 20 MG PO TABS: 20.0000 mg | ORAL_TABLET | Freq: Every day | ORAL | Status: DC

## 2016-08-29 MED ORDER — BISACODYL 5 MG PO TBEC
5.0000 mg | DELAYED_RELEASE_TABLET | Freq: Every day | ORAL | Status: DC | PRN
  Administered 2016-08-29: 5 mg via ORAL
  Filled 2016-08-29: qty 1

## 2016-08-29 MED ORDER — SODIUM CHLORIDE 0.9 % IV SOLN: INTRAVENOUS | Status: DC

## 2016-08-29 MED ORDER — HEPARIN SODIUM (PORCINE) 5000 UNIT/ML IJ SOLN
5000.0000 [IU] | Freq: Three times a day (TID) | INTRAMUSCULAR | Status: DC
  Administered 2016-08-29 – 2016-08-30 (×3): 5000 [IU] via SUBCUTANEOUS
  Filled 2016-08-29 (×3): qty 1

## 2016-08-29 MED ORDER — ACETAMINOPHEN 325 MG PO TABS: 650.0000 mg | ORAL_TABLET | ORAL | Status: DC | PRN

## 2016-08-29 MED ORDER — ASPIRIN 81 MG PO CHEW
324.0000 mg | CHEWABLE_TABLET | Freq: Once | ORAL | Status: AC
  Administered 2016-08-29: 324 mg via ORAL
  Filled 2016-08-29: qty 4

## 2016-08-29 MED ORDER — LOSARTAN POTASSIUM 50 MG PO TABS
100.0000 mg | ORAL_TABLET | Freq: Once | ORAL | Status: AC
  Administered 2016-08-29: 100 mg via ORAL
  Filled 2016-08-29: qty 2

## 2016-08-29 MED ORDER — ASPIRIN 81 MG PO CHEW
324.0000 mg | CHEWABLE_TABLET | Freq: Every day | ORAL | Status: DC
  Administered 2016-08-30: 324 mg via ORAL
  Filled 2016-08-29: qty 4

## 2016-08-29 MED ORDER — MORPHINE SULFATE (PF) 4 MG/ML IV SOLN
4.0000 mg | INTRAVENOUS | Status: DC | PRN
  Administered 2016-08-29: 4 mg via INTRAVENOUS
  Filled 2016-08-29: qty 1

## 2016-08-29 MED ORDER — PANTOPRAZOLE SODIUM 40 MG PO TBEC
40.0000 mg | DELAYED_RELEASE_TABLET | Freq: Every day | ORAL | Status: DC
  Administered 2016-08-30: 40 mg via ORAL
  Filled 2016-08-29: qty 1

## 2016-08-29 MED ORDER — ZOLPIDEM TARTRATE 5 MG PO TABS: 5.0000 mg | ORAL_TABLET | Freq: Every evening | ORAL | Status: DC | PRN

## 2016-08-29 NOTE — ED Provider Notes (Signed)
[facility] DEPT Provider Note   CSN: [id number] Arrival date & time: 08/29/16  0831     History   Chief Complaint Chief Complaint  Patient presents with  . Chest Pain    HPI Susan Horton is a 46 y.o. female.  HPI Patient has known coronary artery disease with drug-eluting stent placed in the LAD for 80% stenosis 7\26\2018. Patient has done well since then. She has not been having episodes of chest pain. She reports she is compliant with her Plavix and aspirin as prescribed, (although she had not yet taken it this morning prior to arrival). She started having chest pain yesterday evening while shopping at the mall. It started with tightness generally in the central and upper chest. Once she was home she started to note is aching discomfort into her shoulder and upper back as well as upper left arm. He reports she was afraid to take nitroglycerin because she wasn't sure if it was really appropriate in this circumstance. The symptoms continued more or less constant with waxing and waning severity throughout the night. She reports it did wake her up several times due to discomfort. Symptoms were still present this morning. She decided to seek treatment. Patient states that she chronically has symptoms of gastroparesis, this never changes. She at least vomits several times a day regardless of how she manages it. She reports she is compliant with her daily Protonix and GI medications. She states that despite having these symptoms on a regular basis since her stent, she has never experienced chest pain or shoulder pain and associated for with them previously. Past Medical History:  Diagnosis Date  . Asthma    prn inhaler  . Cataract   . Chest pain   . Chronic kidney disease   . Diabetes mellitus    IDDM  . DVT (deep venous thrombosis) ([place])    20+ yrs ago (during pregnancy), 2015  . Essential hypertension, benign 12/11/2015  . Gastroparesis   . GERD (gastroesophageal reflux disease)     . Headache(784.0)    migraines  . Hidradenitis     Patient Active Problem List   Diagnosis Date Noted  . CKD (chronic kidney disease), stage III: followed by Dr. Jimmy Footman 08/06/2016  . CAD S/P mLAD PCI with DES 08/06/2016  . Presence of drug coated stent in LAD coronary artery 08/06/2016  . Coronary artery disease involving native coronary artery of native heart with angina pectoris ([place]) 08/05/2016  . Abnormal stress test   . Carotid bruit present 06/15/2016  . Dyslipidemia 06/15/2016  . Smoker 06/15/2016  . Diabetes mellitus due to underlying condition, uncontrolled, with stage 4 chronic kidney disease, with long-term current use of insulin ([place]) 04/15/2016  . Neuropathy 03/10/2016  . Essential hypertension, benign 12/11/2015  . Diabetic nephropathy associated with type 2 diabetes mellitus ([place]) 12/11/2015  . Diabetic gastroparesis ([place])   . Progressive angina ([place]) 06/05/2014  . Intractable nausea and vomiting 06/05/2014  . Uncontrolled type 2 diabetes mellitus with peripheral neuropathy ([place]) 06/05/2014  . Normocytic anemia 06/05/2014  . Hyperglycemia   . Monilial vaginitis 07/10/2010    Past Surgical History:  Procedure Laterality Date  . ABDOMINOPLASTY    . APPENDECTOMY  1995  . CHOLECYSTECTOMY  1993  . CORONARY ANGIOPLASTY WITH STENT PLACEMENT    . CORONARY STENT INTERVENTION N/A 08/05/2016   Procedure: Coronary Stent Intervention;  Surgeon: [name], MD;  Location: [facility];  Service: Cardiovascular;  Laterality: N/A;  . EXPLORATORY LAPAROTOMY  08/14/2005   lysis of adhesions, drainage of tubo-ovarian abscess  . HYDRADENITIS EXCISION  02/08/2011   Procedure: EXCISION HYDRADENITIS GROIN;  Surgeon: [name], MD;  Location: [facility];  Service: General;  Laterality: N/A;  Excisioin of Hidradenitis Left groin  . INGUINAL HIDRADENITIS EXCISION  07/06/2010   bilateral  . LEFT HEART CATH AND CORONARY ANGIOGRAPHY N/A 08/05/2016    Procedure: Left Heart Cath and Coronary Angiography;  Surgeon: [name], MD;  Location: [facility];  Service: Cardiovascular;  Laterality: N/A;  . REDUCTION MAMMAPLASTY  2002  . TONSILLECTOMY    . TUBAL LIGATION  1996  . VAGINAL HYSTERECTOMY  08/04/2005   and cysto    OB History    No data available       Home Medications    Prior to Admission medications   Medication Sig Start Date End Date Taking? Authorizing Provider  albuterol (PROVENTIL HFA;VENTOLIN HFA) 108 (90 BASE) MCG/ACT inhaler Inhale 1-2 puffs into the lungs every 6 (six) hours as needed for wheezing or shortness of breath.     [provider]  amLODipine (NORVASC) 10 MG tablet Take 1 tablet (10 mg total) by mouth daily. 01/21/16   [name], MD  aspirin 81 MG chewable tablet Chew 1 tablet (81 mg total) by mouth daily. 08/07/16   Cheryln Manly, NP  Calcifediol ER (RAYALDEE) 30 MCG CPCR Take 1 capsule by mouth daily.    [provider]  calcitRIOL (ROCALTROL) 0.25 MCG capsule Take 0.25 mcg by mouth daily. 05/26/16   [provider]  Capsaicin-Menthol-Methyl Sal (CAPSAICIN-METHYL SAL-MENTHOL) 0.025-1-12 % CREA Apply 1 application topically 4 (four) times daily as needed. For aches/pains, apply to skin. May need gloves. Patient taking differently: Apply 1 application topically 4 (four) times daily as needed (for aches or pains (may need gloves)).  02/24/16   Maren Reamer, MD  clopidogrel (PLAVIX) 75 MG tablet Take 1 tablet (75 mg total) by mouth daily with breakfast. 08/07/16   Reino Bellis B, NP  DULoxetine (CYMBALTA) 30 MG capsule Take 1 capsule (30 mg total) by mouth daily. Patient taking differently: Take 30 mg by mouth every evening.  05/26/16   [name], MD  Ferrous Sulfate (IRON) 325 (65 Fe) MG TABS Take 325 mg by mouth daily. 11/25/15   Brayton Caves, PA-C  Fluticasone-Salmeterol (ADVAIR) 250-50 MCG/DOSE AEPB Inhale 1 puff into the lungs 2 (two)  times daily as needed (FOR SHORTNESS OF BREATH).     [provider]  gabapentin (NEURONTIN) 300 MG capsule Take 2 capsules (600 mg total) by mouth 3 (three) times daily. 03/10/16   [name], MD  glipiZIDE (GLUCOTROL) 10 MG tablet Take 1 tablet (10 mg total) by mouth 2 (two) times daily before a meal. 05/26/16   Jegede, Olugbemiga E, MD  Insulin Glargine (TOUJEO SOLOSTAR) 300 UNIT/ML SOPN Inject 50 Units into the skin 2 (two) times daily. Patient taking differently: Inject 50 Units into the skin 2 (two) times daily after a meal.  04/15/16   Philemon Kingdom, MD  insulin regular (NOVOLIN R,HUMULIN R) 250 units/2.62mL (100 units/mL) injection Inject 0.3 mLs (30 Units total) into the skin 3 (three) times daily before meals. Sliding scale 02/25/16   [name], MD  Insulin Syringe 27G X 1/2" 0.5 ML MISC 1 Syringe by Does not apply route 5 (five) times daily. 11/20/15   Orlie Dakin, MD  losartan (COZAAR) 100 MG tablet Take 1  tablet (100 mg total) by mouth daily. 01/21/16   [name], MD  lovastatin (MEVACOR) 20 MG tablet Take 20 mg by mouth daily.    [provider]  metoCLOPramide (REGLAN) 5 MG tablet Take 1 tablet (5 mg total) by mouth 4 (four) times daily -  before meals and at bedtime. 01/21/16   [name], MD  metoprolol tartrate (LOPRESSOR) 25 MG tablet Take 1 tablet (25 mg total) by mouth 2 (two) times daily. Along with one 50 mg tablet to equal 75 mg twice daily. 08/13/16 11/11/16  [name], MD  metoprolol tartrate (LOPRESSOR) 50 MG tablet Take 1 tablet (50 mg total) by mouth 2 (two) times daily. Along with one 25 mg tablet to equal 75 mg twice daily. 08/13/16   [name], MD  mirtazapine (REMERON SOL-TAB) 15 MG disintegrating tablet TAKE 1 TABLET BY MOUTH AT BEDTIME. 05/26/16   [name], MD  montelukast (SINGULAIR) 10 MG tablet Take 10 mg by mouth daily as needed (allergies).     [provider]  nitroGLYCERIN  (NITROSTAT) 0.4 MG SL tablet Place 1 tablet (0.4 mg total) under the tongue every 5 (five) minutes as needed. 08/06/16   Cheryln Manly, NP  pantoprazole (PROTONIX) 40 MG tablet Take 1 tablet (40 mg total) by mouth daily. 08/07/16   Cheryln Manly, NP    Family History Family History  Problem Relation Age of Onset  . Hypertension Father   . Hypertension Sister   . Hypertension Brother     Social History Social History  Substance Use Topics  . Smoking status: Former Smoker    Packs/day: 0.10    Years: 20.00    Types: Cigarettes  . Smokeless tobacco: Never Used  . Alcohol use No     Allergies   Penicillin g; Penicillins; and Lisinopril   Review of Systems Review of Systems 10 Systems reviewed and are negative for acute change except as noted in the HPI.   Physical Exam Updated Vital Signs BP (!) 169/81   Pulse 76   Temp 98 F (36.7 C) (Oral)   Resp 16   Ht 5\' 5"  (1.651 m)   Wt 76.2 kg (168 lb)   SpO2 100%   BMI 27.96 kg/m   Physical Exam  Constitutional: She is oriented to person, place, and time. She appears well-developed and well-nourished. No distress.  HENT:  Head: Normocephalic and atraumatic.  Eyes: Conjunctivae and EOM are normal.  Neck: Neck supple.  Cardiovascular: Normal rate, regular rhythm, normal heart sounds and intact distal pulses.   No murmur heard. Pulmonary/Chest: Effort normal and breath sounds normal. No respiratory distress.  Abdominal: Soft. She exhibits no distension. There is no tenderness. There is no guarding.  Musculoskeletal: Normal range of motion. She exhibits no edema or tenderness.  Neurological: She is alert and oriented to person, place, and time. No cranial nerve deficit. She exhibits normal muscle tone. Coordination normal.  Skin: Skin is warm and dry.  Psychiatric: She has a normal mood and affect.  Nursing note and vitals reviewed.    ED Treatments / Results  Labs (all labs ordered are listed, but only  abnormal results are displayed) Labs Reviewed  BASIC METABOLIC PANEL - Abnormal; Notable for the following:       Result Value   Sodium 131 (*)    CO2 21 (*)    Glucose, Bld 459 (*)    BUN 41 (*)    Creatinine, Ser  2.75 (*)    Calcium 8.6 (*)    GFR calc non Af Amer 20 (*)    GFR calc Af Amer 23 (*)    All other components within normal limits  CBC - Abnormal; Notable for the following:    RBC 3.76 (*)    Hemoglobin 9.2 (*)    HCT 29.0 (*)    MCV 77.1 (*)    MCH 24.5 (*)    Platelets 478 (*)    All other components within normal limits  HEPATIC FUNCTION PANEL - Abnormal; Notable for the following:    Albumin 2.7 (*)    AST 9 (*)    ALT 8 (*)    Alkaline Phosphatase 135 (*)    Bilirubin, Direct <0.1 (*)    All other components within normal limits  URINALYSIS, ROUTINE W REFLEX MICROSCOPIC - Abnormal; Notable for the following:    APPearance HAZY (*)    Glucose, UA >=500 (*)    Protein, ur 100 (*)    Bacteria, UA MANY (*)    Squamous Epithelial / LPF 6-30 (*)    All other components within normal limits  CBG MONITORING, ED - Abnormal; Notable for the following:    Glucose-Capillary 428 (*)    All other components within normal limits  LIPASE, BLOOD  I-STAT TROPONIN, ED  I-STAT TROPONIN, ED    EKG  EKG Interpretation  Date/Time:  Sunday August 29 2016 08:32:53 EDT Ventricular Rate:  79 PR Interval:  164 QRS Duration: 76 QT Interval:  380 QTC Calculation: 435 R Axis:   40 Text Interpretation:  Normal sinus rhythm Abnormal QRS-T angle, consider primary T wave abnormality Abnormal ECG Some baseline artifact. no acute ischemic changes. Confirmed by Charlesetta Shanks [phone]) on 08/29/2016 9:57:51 AM       Radiology Dg Chest 2 View  Result Date: 08/29/2016 CLINICAL DATA:  Chest tightness. EXAM: CHEST  2 VIEW COMPARISON:  July 05, 2016 FINDINGS: The heart size and mediastinal contours are within normal limits. Both lungs are clear. The visualized skeletal structures  are unremarkable. IMPRESSION: No active cardiopulmonary disease. Electronically Signed   By: Dorise Bullion III M.D   On: 08/29/2016 09:04    Procedures Procedures (including critical care time)  Medications Ordered in ED Medications  metoprolol tartrate (LOPRESSOR) tablet 50 mg (not administered)  insulin glargine (LANTUS) injection 50 Units (not administered)  insulin aspart (novoLOG) injection 15 Units (not administered)  nitroGLYCERIN (NITROSTAT) SL tablet 0.4 mg (not administered)  aspirin chewable tablet 324 mg (324 mg Oral Given 08/29/16 1139)  nitroGLYCERIN (NITROSTAT) SL tablet 0.4 mg (0.4 mg Sublingual Given 08/29/16 1142)  clopidogrel (PLAVIX) tablet 75 mg (75 mg Oral Given 08/29/16 1139)  losartan (COZAAR) tablet 100 mg (100 mg Oral Given 08/29/16 1205)     Initial Impression / Assessment and Plan / ED Course  I have reviewed the triage vital signs and the nursing notes.  Pertinent labs & imaging results that were available during my care of the patient were reviewed by me and considered in my medical decision making (see chart for details).    Consult: (12:35) reviewed with Dr. Domenic Polite cardiology. Advises will assess patient in the emergency department for consultation.  Patient had not had regular morning medications. Plavix, aspirin, antihypertensives and Lantus and regular insulin administered as per patient's home dosing. Final Clinical Impressions(s) / ED Diagnoses   Final diagnoses:  Precordial chest pain  Coronary artery disease involving native coronary artery of native heart, angina presence  unspecified   Patient has had ongoing chest pain since yesterday evening. She does have risk factor of known coronary artery disease with stent placed a little less than a month ago. Patient is rated her medications administered in the emergency department. Cardiology has been consulted and advises for observation on medical service. New Prescriptions New Prescriptions    No medications on file     Charlesetta Shanks, MD 08/29/16 [id number]

## 2016-08-29 NOTE — H&P (Signed)
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 Multiple episodes of vomiting yesterday evening which patient normally has her gastroparesis. Describes chest pain as a pressure. Rates it is moderate to severe. Didn't improve with nitroglycerin. No chest trauma. Has been compliant with her medications. Came to the emergency room for evaluation.  ED course: [facility] cardiology. Recommended hospitalist to admit.   Review of Systems:  As per HPI otherwise 10 point review of systems negative.    Past Medical History:  Diagnosis Date  . Asthma   . CAD (coronary artery disease)    DES to mid LAD July 2018, residual moderate RCA disease  . Cataract   . CKD (chronic kidney disease) stage 4, GFR 15-29 ml/min ([facility])   . DVT (deep venous thrombosis) ([place])    20+ yrs ago (during pregnancy), 2015  . Essential hypertension 12/11/2015  . Gastroparesis   . GERD (gastroesophageal reflux disease)   . Hidradenitis   . Migraine   . Type 2 diabetes mellitus ([place])    Past Surgical History:  Procedure Laterality Date  . ABDOMINOPLASTY    . APPENDECTOMY  1995  . CHOLECYSTECTOMY  1993  . CORONARY ANGIOPLASTY WITH STENT PLACEMENT    . CORONARY STENT INTERVENTION N/A 08/05/2016   Procedure: Coronary Stent Intervention;  Surgeon: [name], MD;  Location: [facility];  Service: Cardiovascular;  Laterality: N/A;  . EXPLORATORY LAPAROTOMY  08/14/2005   lysis of adhesions, drainage of tubo-ovarian abscess  . HYDRADENITIS EXCISION  02/08/2011   Procedure: EXCISION HYDRADENITIS GROIN;  Surgeon: [name], MD;  Location: [facility];  Service: General;  Laterality: N/A;  Excisioin of Hidradenitis Left groin  . INGUINAL HIDRADENITIS EXCISION  07/06/2010   bilateral  . LEFT HEART CATH AND CORONARY ANGIOGRAPHY N/A 08/05/2016   Procedure: Left Heart Cath and Coronary Angiography;  Surgeon: [name], MD;  Location: [facility];  Service: Cardiovascular;  Laterality: N/A;  . REDUCTION MAMMAPLASTY  2002  . TONSILLECTOMY    . TUBAL LIGATION  1996  . VAGINAL HYSTERECTOMY  08/04/2005   and cysto   Social History:  reports that she has quit smoking. Her smoking use included Cigarettes. She has a 2.00 pack-year smoking history. She has never used smokeless tobacco. She reports that she does not drink alcohol or use drugs.  Allergies  Allergen Reactions  . Penicillin G Anaphylaxis, Itching, Swelling and Rash  . Penicillins Anaphylaxis, Itching, Swelling and Rash    Swelling of throat & whole mouth  Has patient had a PCN reaction causing immediate rash, facial/tongue/throat swelling, SOB or lightheadedness with hypotension: Yes Has patient had a PCN reaction causing severe rash involving mucus membranes or skin necrosis: No Has patient had a PCN reaction that required hospitalization: No Has patient had a PCN reaction occurring within the last 10 years: No If all of the above answers are "NO", then may proceed with Cephalosporin use.  [name] Lisinopril Cough    Family History  Problem Relation Age of Onset  . Hypertension Father   . Hypertension Sister   . Hypertension Brother      Prior to Admission medications   Medication Sig Start Date End Date Taking? Authorizing Provider  amLODipine (NORVASC) 10 MG tablet Take  1 tablet (10 mg total) by mouth daily. 01/21/16  Yes [name], MD  aspirin 81 MG chewable tablet Chew 1 tablet (81 mg total) by mouth daily. 08/07/16  Yes Cheryln Manly, NP  Calcifediol ER (RAYALDEE) 30 MCG CPCR Take 30 mcg by mouth daily.    Yes [provider]  calcitRIOL (ROCALTROL) 0.25 MCG capsule Take 0.25 mcg by mouth daily. 05/26/16  Yes [provider]  Capsaicin-Menthol-Methyl Sal  (CAPSAICIN-METHYL SAL-MENTHOL) 0.025-1-12 % CREA Apply 1 application topically 4 (four) times daily as needed. For aches/pains, apply to skin. May need gloves. Patient taking differently: Apply 1 application topically 4 (four) times daily as needed (for aches or pains (may need gloves)).  02/24/16  Yes Maren Reamer, MD  clopidogrel (PLAVIX) 75 MG tablet Take 1 tablet (75 mg total) by mouth daily with breakfast. 08/07/16  Yes Reino Bellis B, NP  DULoxetine (CYMBALTA) 30 MG capsule Take 1 capsule (30 mg total) by mouth daily. Patient taking differently: Take 30 mg by mouth every evening.  05/26/16  Yes [name], MD  Ferrous Sulfate (IRON) 325 (65 Fe) MG TABS Take 325 mg by mouth daily. 11/25/15  Yes Ena Dawley, Tiffany S, PA-C  gabapentin (NEURONTIN) 300 MG capsule Take 2 capsules (600 mg total) by mouth 3 (three) times daily. 03/10/16  Yes [name], MD  glipiZIDE (GLUCOTROL) 10 MG tablet Take 1 tablet (10 mg total) by mouth 2 (two) times daily before a meal. 05/26/16  Yes Jegede, Olugbemiga E, MD  Insulin Glargine (TOUJEO SOLOSTAR) 300 UNIT/ML SOPN Inject 50 Units into the skin 2 (two) times daily. Patient taking differently: Inject 50 Units into the skin 2 (two) times daily after a meal.  04/15/16  Yes Philemon Kingdom, MD  insulin regular (NOVOLIN R,HUMULIN R) 250 units/2.86mL (100 units/mL) injection Inject 0.3 mLs (30 Units total) into the skin 3 (three) times daily before meals. Sliding scale Patient taking differently: Inject 5-20 Units into the skin 3 (three) times daily before meals. Sliding scale  02/25/16  Yes Jegede, Olugbemiga E, MD  losartan (COZAAR) 100 MG tablet Take 1 tablet (100 mg total) by mouth daily. 01/21/16  Yes Jegede, Marlena Clipper, MD  lovastatin (MEVACOR) 20 MG tablet Take 20 mg by mouth daily.   Yes [provider]  metoCLOPramide (REGLAN) 5 MG tablet Take 1 tablet (5 mg total) by mouth 4 (four) times daily -  before meals and at bedtime. 01/21/16   Yes [name], MD  metoprolol tartrate (LOPRESSOR) 25 MG tablet Take 1 tablet (25 mg total) by mouth 2 (two) times daily. Along with one 50 mg tablet to equal 75 mg twice daily. 08/13/16 11/11/16 Yes [name], MD  metoprolol tartrate (LOPRESSOR) 50 MG tablet Take 1 tablet (50 mg total) by mouth 2 (two) times daily. Along with one 25 mg tablet to equal 75 mg twice daily. 08/13/16  Yes [name], MD  mirtazapine (REMERON SOL-TAB) 15 MG disintegrating tablet TAKE 1 TABLET BY MOUTH AT BEDTIME. Patient taking differently: TAKE 15mg  BY MOUTH AT BEDTIME. 05/26/16  Yes Jegede, Marlena Clipper, MD  montelukast (SINGULAIR) 10 MG tablet Take 10 mg by mouth daily as needed (allergies).    Yes [provider]  pantoprazole (PROTONIX) 40 MG tablet Take 1 tablet (40 mg total) by mouth daily. 08/07/16  Yes Cheryln Manly, NP  albuterol (PROVENTIL HFA;VENTOLIN HFA) 108 (90 BASE) MCG/ACT inhaler Inhale 1-2 puffs into the lungs every 6 (six) hours  as needed for wheezing or shortness of breath.     [provider]  Fluticasone-Salmeterol (ADVAIR) 250-50 MCG/DOSE AEPB Inhale 1 puff into the lungs 2 (two) times daily as needed (FOR SHORTNESS OF BREATH).     [provider]  nitroGLYCERIN (NITROSTAT) 0.4 MG SL tablet Place 1 tablet (0.4 mg total) under the tongue every 5 (five) minutes as needed. 08/06/16   Cheryln Manly, NP   Physical Exam: Vitals:   08/29/16 1500 08/29/16 1600 08/29/16 1628 08/29/16 1700  BP: (!) 138/94 139/75 140/69 134/68  Pulse: (!) 59  76 99  Resp: 15 16 (!) 22 16  Temp:      TempSrc:      SpO2:   99%   Weight:      Height:        Wt Readings from Last 3 Encounters:  08/29/16 76.2 kg (168 lb)  08/13/16 76.7 kg (169 lb)  08/06/16 77.5 kg (170 lb 13.7 oz)    General: Alert and oriented 3, Appears calm and comfortable Eyes:  PERRL, EOMI, normal lids, iris ENT:  grossly normal hearing, lips & tongue Neck:  no LAD, masses or  thyromegaly Cardiovascular:  RRR, no m/r/g. No LE edema.  Respiratory:  CTA bilaterally, no w/r/r. Normal respiratory effort. Abdomen:  soft, ntnd Skin:  no rash or induration seen on limited exam Musculoskeletal:  grossly normal tone BUE/BLE Psychiatric:  grossly normal mood and affect, speech fluent and appropriate Neurologic:  CN 2-12 grossly intact, moves all extremities in coordinated fashion.          Labs on Admission:  Basic Metabolic Panel:  Recent Labs Lab 08/29/16 0839  NA 131*  K 4.6  CL 103  CO2 21*  GLUCOSE 459*  BUN 41*  CREATININE 2.75*  CALCIUM 8.6*   Liver Function Tests:  Recent Labs Lab 08/29/16 1022  AST 9*  ALT 8*  ALKPHOS 135*  BILITOT 0.4  PROT 7.2  ALBUMIN 2.7*    Recent Labs Lab 08/29/16 1022  LIPASE 39   No results for input(s): AMMONIA in the last 168 hours. CBC:  Recent Labs Lab 08/29/16 0839  WBC 9.9  HGB 9.2*  HCT 29.0*  MCV 77.1*  PLT 478*   Cardiac Enzymes: No results for input(s): CKTOTAL, CKMB, CKMBINDEX, TROPONINI in the last 168 hours.  BNP (last 3 results)  Recent Labs  07/06/16 0058  BNP 29.5    ProBNP (last 3 results) No results for input(s): PROBNP in the last 8760 hours.   Serum creatinine: 2.75 mg/dL (H) 08/29/16 0839 Estimated creatinine clearance: 26.1 mL/min (A)  CBG:  Recent Labs Lab 08/29/16 0845  GLUCAP 428*    Radiological Exams on Admission: Dg Chest 2 View  Result Date: 08/29/2016 CLINICAL DATA:  Chest tightness. EXAM: CHEST  2 VIEW COMPARISON:  July 05, 2016 FINDINGS: The heart size and mediastinal contours are within normal limits. Both lungs are clear. The visualized skeletal structures are unremarkable. IMPRESSION: No active cardiopulmonary disease. Electronically Signed   By: Dorise Bullion III M.D   On: 08/29/2016 09:04    EKG: Independently reviewed. No STEMI  Assessment/Plan Active Problems:   Chest pain   1) CP Per cardio will admit and r/o - serial trop  ordered, initial neg - prn EKG CP - prn moprhine CP - prn ntg cp - asa in ED and QD - ECHO ordered for AM - tele bed, cardiac monitoring - ambien for sleep prn - zofran prn for nausea  Coronary artery disease Continue Plavix, Lopressor  Reflux Continue Protonix  Hypertension When necessary hydralazine 10 mg IV as needed for severe blood pressure Cont norvasc, norvsc  Gastroparesis Continue Reglan We will consider liquid Reglan if patient's gastroparesis becomes uncontrolled  CKD Continue Rocaltrol, calcifediol  Mood disorder Continue Remeron and Cymbalta No SI/HI  Diabetes Sliding scale insulin Glargine Hold oral diabetes medications   Hyperlipidemia Continue statin  Chronic pain Continue gabapentin  Code Status: FC DVT Prophylaxis: heparin Family Communication: dgtr at bedisde Disposition Plan: Pending Improvement  Status: obs tele  Elwin Mocha, MD Family Medicine [facility] [email or web address] Password [email or web address]

## 2016-08-29 NOTE — ED Notes (Signed)
Pt CBG 138; admitting provider made aware.

## 2016-08-29 NOTE — ED Notes (Signed)
CBG 428.

## 2016-08-29 NOTE — ED Triage Notes (Signed)
Pt states "my chest is tight, since last night, my sugar is elevated, last night it was 540. I took some insulin last night. I've been throwing up too." Pt states she had a recent stent placed in July.

## 2016-08-29 NOTE — ED Notes (Signed)
Pt CBG 98; EDP made aware.

## 2016-08-29 NOTE — Clinical Documentation Improvement (Signed)
Cardiology Consultation:   Patient ID: [name]; [id number]; Nov 11, 1970   Admit date: 08/29/2016 Date of Consult: 08/29/2016  Primary Care Provider: [name], MD Primary Cardiologist: Dr. Quay Burow  Patient Profile:   Susan Horton is a 46 y.o. female with a history of Type 2 diabetes mellitus, hypertension, gastroparesis, and recently diagnosed CAD status post DES to the mid LAD in July of this year who is being seen today for the evaluation of chest pain at the request of Dr. Johnney Killian.  History of Present Illness:   [name] presents to the ER stating that she began to develop a vague sense of tightness in her left upper chest, shoulder, and back last evening, not completely consistent with her previous angina symptoms and also associated with significant hyperglycemia with reported blood sugars in the 500 range. She took insulin and went to bed, this morning felt nauseated and did have emesis, but she states that this is not unusual with her known gastroparesis. She reports compliance with her medications.  She recently underwent cardiac catheterization and PCI with Dr. Earnie Larsson on July 26, had an 80% mid LAD stenosis that was treated with DES, also residual 50-60% RCA stenoses are managed medically. She reports compliance with aspirin and Plavix.  ECG shows no acute ST segment changes and her initial troponin I level is negative.  Past Medical History:  Diagnosis Date  . Asthma   . CAD (coronary artery disease)    DES to mid LAD July 2018, residual moderate RCA disease  . Cataract   . CKD (chronic kidney disease) stage 4, GFR 15-29 ml/min (HCC)   . DVT (deep venous thrombosis) ([facility])    20+ yrs ago (during pregnancy), 2015  . Essential hypertension 12/11/2015  . Gastroparesis   . GERD (gastroesophageal reflux disease)   . Hidradenitis   . Migraine   . Type 2 diabetes mellitus ([facility])     Past Surgical History:  Procedure Laterality Date  . ABDOMINOPLASTY     . APPENDECTOMY  1995  . CHOLECYSTECTOMY  1993  . CORONARY ANGIOPLASTY WITH STENT PLACEMENT    . CORONARY STENT INTERVENTION N/A 08/05/2016   Procedure: Coronary Stent Intervention;  Surgeon: [name], MD;  Location: [facility];  Service: Cardiovascular;  Laterality: N/A;  . EXPLORATORY LAPAROTOMY  08/14/2005   lysis of adhesions, drainage of tubo-ovarian abscess  . HYDRADENITIS EXCISION  02/08/2011   Procedure: EXCISION HYDRADENITIS GROIN;  Surgeon: [name], MD;  Location: [facility];  Service: General;  Laterality: N/A;  Excisioin of Hidradenitis Left groin  . INGUINAL HIDRADENITIS EXCISION  07/06/2010   bilateral  . LEFT HEART CATH AND CORONARY ANGIOGRAPHY N/A 08/05/2016   Procedure: Left Heart Cath and Coronary Angiography;  Surgeon: [name], MD;  Location: [facility];  Service: Cardiovascular;  Laterality: N/A;  . REDUCTION MAMMAPLASTY  2002  . TONSILLECTOMY    . TUBAL LIGATION  1996  . VAGINAL HYSTERECTOMY  08/04/2005   and cysto     Outpatient Medications: No current facility-administered medications on file prior to encounter.    Current Outpatient Prescriptions on File Prior to Encounter  Medication Sig Dispense Refill  . amLODipine (NORVASC) 10 MG tablet Take 1 tablet (10 mg total) by mouth daily. 90 tablet 3  . aspirin 81 MG chewable tablet Chew 1 tablet (81 mg total) by mouth daily. 30 tablet 0  . Calcifediol ER (RAYALDEE) 30 MCG CPCR Take 30  mcg by mouth daily.     . calcitRIOL (ROCALTROL) 0.25 MCG capsule Take 0.25 mcg by mouth daily.  6  . Capsaicin-Menthol-Methyl Sal (CAPSAICIN-METHYL SAL-MENTHOL) 0.025-1-12 % CREA Apply 1 application topically 4 (four) times daily as needed. For aches/pains, apply to skin. May need gloves. (Patient taking differently: Apply 1 application topically 4 (four) times daily as needed (for aches or pains (may need gloves)). ) 1 Tube 3  . clopidogrel (PLAVIX) 75 MG tablet Take 1 tablet (75  mg total) by mouth daily with breakfast. 90 tablet 2  . DULoxetine (CYMBALTA) 30 MG capsule Take 1 capsule (30 mg total) by mouth daily. (Patient taking differently: Take 30 mg by mouth every evening. ) 30 capsule 3  . Ferrous Sulfate (IRON) 325 (65 Fe) MG TABS Take 325 mg by mouth daily. 30 each 3  . gabapentin (NEURONTIN) 300 MG capsule Take 2 capsules (600 mg total) by mouth 3 (three) times daily. 180 capsule 3  . glipiZIDE (GLUCOTROL) 10 MG tablet Take 1 tablet (10 mg total) by mouth 2 (two) times daily before a meal. 60 tablet 3  . Insulin Glargine (TOUJEO SOLOSTAR) 300 UNIT/ML SOPN Inject 50 Units into the skin 2 (two) times daily. (Patient taking differently: Inject 50 Units into the skin 2 (two) times daily after a meal. ) 9 pen 5  . insulin regular (NOVOLIN R,HUMULIN R) 250 units/2.70mL (100 units/mL) injection Inject 0.3 mLs (30 Units total) into the skin 3 (three) times daily before meals. Sliding scale (Patient taking differently: Inject 5-20 Units into the skin 3 (three) times daily before meals. Sliding scale ) 30 mL 3  . losartan (COZAAR) 100 MG tablet Take 1 tablet (100 mg total) by mouth daily. 90 tablet 3  . lovastatin (MEVACOR) 20 MG tablet Take 20 mg by mouth daily.    . metoCLOPramide (REGLAN) 5 MG tablet Take 1 tablet (5 mg total) by mouth 4 (four) times daily -  before meals and at bedtime. 90 tablet 3  . metoprolol tartrate (LOPRESSOR) 25 MG tablet Take 1 tablet (25 mg total) by mouth 2 (two) times daily. Along with one 50 mg tablet to equal 75 mg twice daily. 180 tablet 3  . metoprolol tartrate (LOPRESSOR) 50 MG tablet Take 1 tablet (50 mg total) by mouth 2 (two) times daily. Along with one 25 mg tablet to equal 75 mg twice daily. 60 tablet 6  . mirtazapine (REMERON SOL-TAB) 15 MG disintegrating tablet TAKE 1 TABLET BY MOUTH AT BEDTIME. (Patient taking differently: TAKE 15mg  BY MOUTH AT BEDTIME.) 30 tablet 1  . montelukast (SINGULAIR) 10 MG tablet Take 10 mg by mouth daily as  needed (allergies).     . pantoprazole (PROTONIX) 40 MG tablet Take 1 tablet (40 mg total) by mouth daily. 30 tablet 2  . albuterol (PROVENTIL HFA;VENTOLIN HFA) 108 (90 BASE) MCG/ACT inhaler Inhale 1-2 puffs into the lungs every 6 (six) hours as needed for wheezing or shortness of breath.     . Fluticasone-Salmeterol (ADVAIR) 250-50 MCG/DOSE AEPB Inhale 1 puff into the lungs 2 (two) times daily as needed (FOR SHORTNESS OF BREATH).     [name] nitroGLYCERIN (NITROSTAT) 0.4 MG SL tablet Place 1 tablet (0.4 mg total) under the tongue every 5 (five) minutes as needed. 25 tablet 3  . [DISCONTINUED] ranitidine (ZANTAC) 75 MG/5ML syrup Take 10 mLs (150 mg total) by mouth 2 (two) times daily. (Patient not taking: Reported on 10/23/2014) 180 mL 0    Allergies:  Allergies  Allergen Reactions  . Penicillin G Anaphylaxis, Itching, Swelling and Rash  . Penicillins Anaphylaxis, Itching, Swelling and Rash    Swelling of throat & whole mouth  Has patient had a PCN reaction causing immediate rash, facial/tongue/throat swelling, SOB or lightheadedness with hypotension: Yes Has patient had a PCN reaction causing severe rash involving mucus membranes or skin necrosis: No Has patient had a PCN reaction that required hospitalization: No Has patient had a PCN reaction occurring within the last 10 years: No If all of the above answers are "NO", then may proceed with Cephalosporin use.  [name] Lisinopril Cough    Social History:   Social History   Social History  . Marital status: Single    Spouse name: N/A  . Number of children: 3  . Years of education: 14   Occupational History  . Corrections officer    Social History Main Topics  . Smoking status: Former Smoker    Packs/day: 0.10    Years: 20.00    Types: Cigarettes  . Smokeless tobacco: Never Used  . Alcohol use No  . Drug use: No  . Sexual activity: Not on file   Other Topics Concern  . Not on file   Social History Narrative   Lives with daughter  in a 2 story home.  Has 3 children.  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  Education: college.     Family History:   The patient's family history includes Hypertension in her brother, father, and sister.  ROS:  Please see the history of present illness.  All other ROS reviewed and negative.     Physical Exam/Data:   Vitals:   08/29/16 1242 08/29/16 1315 08/29/16 1330 08/29/16 1331  BP:  (!) 161/77 (!) 144/93 (!) 144/93  Pulse: 72 73 77 73  Resp: 15 17 14    Temp:      TempSrc:      SpO2: 100% 98% 93%   Weight:      Height:       No intake or output data in the 24 hours ending 08/29/16 1417 Filed Weights   08/29/16 0837 08/29/16 0840  Weight: 170 lb (77.1 kg) 168 lb (76.2 kg)   Body mass index is 27.96 kg/m.   Gen: Patient appears comfortable at rest. HEENT: Conjunctiva and lids normal, oropharynx clear. Neck: Supple, no elevated JVP or carotid bruits, no thyromegaly. Lungs: Clear to auscultation, nonlabored breathing at rest. Cardiac: Regular rate and rhythm, no S3 or significant systolic murmur, no pericardial rub. Abdomen: Soft, nontender, bowel sounds present, no guarding or rebound. Extremities: No pitting edema, distal pulses 2+. Skin: Warm and dry. Musculoskeletal: No kyphosis. Neuropsychiatric: Alert and oriented x3, affect grossly appropriate.  EKG:  I personally reviewed the tracing from 08/29/2016 which shows normal sinus rhythm.  Telemetry:  I personally reviewed telemetry which shows sinus rhythm.  Relevant CV Studies:  Cardiac catheterization and PCI 08/05/2016:  Prox RCA lesion, 50 %stenosed.  Mid RCA lesion, 60 %stenosed.  Mid LAD lesion, 80 %stenosed.  Post intervention, there is a 0% residual stenosis.  A stent was successfully placed.  IMPRESSION: Successful mid LAD PCI and drug-eluting stenting using a synergy drug-eluting stent reducing 80% stenosis to 0% residual. Patient had moderately not critical proximal mid  dominant RCA stenosis which will be treated medically. She had normal LV function by 2-D echocardiography. The LAD lesion corresponded to the area of abnormality on Myoview stress testing. She did have significant  ostial left main spasm which ultimately responded to intracoronary nitroglycerin. The sheaths will be removed and pressure held on the groin to achieve hemostasis. She'll apply sterile condition. She'll be hydrated overnight, discharged home in the morning on triple antibiotic therapy. I will see her back in the office in the office in 2-3 weeks for follow-up.  Laboratory Data:  Chemistry  Recent Labs Lab 08/29/16 0839  NA 131*  K 4.6  CL 103  CO2 21*  GLUCOSE 459*  BUN 41*  CREATININE 2.75*  CALCIUM 8.6*  GFRNONAA 20*  GFRAA 23*  ANIONGAP 7     Recent Labs Lab 08/29/16 1022  PROT 7.2  ALBUMIN 2.7*  AST 9*  ALT 8*  ALKPHOS 135*  BILITOT 0.4   Hematology  Recent Labs Lab 08/29/16 0839  WBC 9.9  RBC 3.76*  HGB 9.2*  HCT 29.0*  MCV 77.1*  MCH 24.5*  MCHC 31.7  RDW 13.1  PLT 478*   Cardiac EnzymesNo results for input(s): TROPONINI in the last 168 hours.   Recent Labs Lab 08/29/16 0850 08/29/16 1221  TROPIPOC 0.00 0.00    Radiology/Studies:  Dg Chest 2 View  Result Date: 08/29/2016 CLINICAL DATA:  Chest tightness. EXAM: CHEST  2 VIEW COMPARISON:  July 05, 2016 FINDINGS: The heart size and mediastinal contours are within normal limits. Both lungs are clear. The visualized skeletal structures are unremarkable. IMPRESSION: No active cardiopulmonary disease. Electronically Signed   By: Dorise Bullion III M.D   On: 08/29/2016 09:04    Assessment and Plan:   1. Recurrent chest pain, typical and atypical features, not entirely consistent with her prior angina. Also noted in the setting of significant hyperglycemia and emesis with gastroparesis. ECG shows no acute ST segment changes and troponin I levels are negative at this point.  2. CAD status post  recent DES to the mid LAD on July 26 with moderate residual RCA disease to be managed medically. Patient reports compliance with her medications including aspirin and Plavix.  3. Uncontrolled type 2 diabetes mellitus, glucose 459 in ER.  4. Patient reports recurrent nausea and emesis with gastroparesis in the setting of diabetes mellitus.  5. Essential hypertension, outpatient regimen includes Norvasc, Cozaar, and Lopressor.  6. CKD stage 3 to 4, follows with nephrology, creatinine 2.75.  Recommend admission for observation on medicine service, cycle cardiac markers and follow-up ECG tomorrow. She needs to get much better control of glucose and may need further medication adjustments. Unlikely that the 50-60% RCA stenosis would be causing rest chest pain, and without acute ST segment changes or abnormal troponin I levels, likelihood of acute thrombosis of recent LAD stent is low. Continue aspirin, Plavix, Norvasc, Cozaar, and Lopressor. Might consider adding Imdur next. Our service will follow with further recommendations.  Signed, Rozann Lesches, MD  08/29/2016 2:17 PM

## 2016-08-29 NOTE — Progress Notes (Signed)
New Admission Note:   Arrival Method: ED tech on ED bed Mental Orientation: A&O x4 Telemetry: initiated, verified Skin: WNL Pain: chest pain-1, legs and feet hurt, constipation Family: at bedside  Orders to be reviewed and implemented. Will continue to monitor the patient. Call light has been placed within reach and bed alarm has been activated.   Riki Altes, RN Phone: [phone]

## 2016-08-29 NOTE — ED Notes (Signed)
Attempted to call report

## 2016-08-30 ENCOUNTER — Other Ambulatory Visit (HOSPITAL_COMMUNITY): Payer: Self-pay

## 2016-08-30 ENCOUNTER — Other Ambulatory Visit: Payer: Self-pay

## 2016-08-30 ENCOUNTER — Observation Stay (HOSPITAL_COMMUNITY): Payer: Medicaid Other

## 2016-08-30 DIAGNOSIS — K3184 Gastroparesis: Secondary | ICD-10-CM

## 2016-08-30 DIAGNOSIS — E1122 Type 2 diabetes mellitus with diabetic chronic kidney disease: Secondary | ICD-10-CM | POA: Diagnosis not present

## 2016-08-30 DIAGNOSIS — Z794 Long term (current) use of insulin: Secondary | ICD-10-CM

## 2016-08-30 DIAGNOSIS — I2510 Atherosclerotic heart disease of native coronary artery without angina pectoris: Secondary | ICD-10-CM

## 2016-08-30 DIAGNOSIS — R0789 Other chest pain: Secondary | ICD-10-CM

## 2016-08-30 DIAGNOSIS — E0822 Diabetes mellitus due to underlying condition with diabetic chronic kidney disease: Secondary | ICD-10-CM

## 2016-08-30 DIAGNOSIS — I10 Essential (primary) hypertension: Secondary | ICD-10-CM

## 2016-08-30 DIAGNOSIS — E0800 Diabetes mellitus due to underlying condition with hyperosmolarity without nonketotic hyperglycemic-hyperosmolar coma (NKHHC): Secondary | ICD-10-CM

## 2016-08-30 DIAGNOSIS — E0865 Diabetes mellitus due to underlying condition with hyperglycemia: Secondary | ICD-10-CM

## 2016-08-30 DIAGNOSIS — E1043 Type 1 diabetes mellitus with diabetic autonomic (poly)neuropathy: Secondary | ICD-10-CM | POA: Diagnosis not present

## 2016-08-30 DIAGNOSIS — K5909 Other constipation: Secondary | ICD-10-CM

## 2016-08-30 DIAGNOSIS — I129 Hypertensive chronic kidney disease with stage 1 through stage 4 chronic kidney disease, or unspecified chronic kidney disease: Secondary | ICD-10-CM | POA: Diagnosis not present

## 2016-08-30 DIAGNOSIS — N184 Chronic kidney disease, stage 4 (severe): Secondary | ICD-10-CM

## 2016-08-30 LAB — GLUCOSE, CAPILLARY
GLUCOSE-CAPILLARY: 95 mg/dL (ref 65–99)
GLUCOSE-CAPILLARY: 207 mg/dL — AB (ref 65–99)
GLUCOSE-CAPILLARY: 194 mg/dL — AB (ref 65–99)
GLUCOSE-CAPILLARY: 181 mg/dL — AB (ref 65–99)
Glucose-Capillary: 370 mg/dL — ABNORMAL HIGH (ref 65–99)
Glucose-Capillary: 137 mg/dL — ABNORMAL HIGH (ref 65–99)
Glucose-Capillary: 201 mg/dL — ABNORMAL HIGH (ref 65–99)

## 2016-08-30 LAB — BASIC METABOLIC PANEL
ANION GAP: 6 (ref 5–15)
BUN: 48 mg/dL — ABNORMAL HIGH (ref 6–20)
CHLORIDE: 111 mmol/L (ref 101–111)
CO2: 21 mmol/L — ABNORMAL LOW (ref 22–32)
Calcium: 8.2 mg/dL — ABNORMAL LOW (ref 8.9–10.3)
Creatinine, Ser: 3.27 mg/dL — ABNORMAL HIGH (ref 0.44–1.00)
GFR calc non Af Amer: 16 mL/min — ABNORMAL LOW (ref >60)
GFR, EST AFRICAN AMERICAN: 18 mL/min — AB (ref >60)
Glucose, Bld: 148 mg/dL — ABNORMAL HIGH (ref 65–99)
POTASSIUM: 4.5 mmol/L (ref 3.5–5.1)
SODIUM: 138 mmol/L (ref 135–145)

## 2016-08-30 LAB — CBC
HCT: 25.6 % — ABNORMAL LOW (ref 36.0–46.0)
HEMOGLOBIN: 8 g/dL — AB (ref 12.0–15.0)
MCH: 24.6 pg — AB (ref 26.0–34.0)
MCHC: 31.3 g/dL (ref 30.0–36.0)
MCV: 78.8 fL (ref 78.0–100.0)
Platelets: 403 10*3/uL — ABNORMAL HIGH (ref 150–400)
RBC: 3.25 MIL/uL — AB (ref 3.87–5.11)
RDW: 13.6 % (ref 11.5–15.5)
WBC: 9.2 10*3/uL (ref 4.0–10.5)

## 2016-08-30 LAB — TROPONIN I: Troponin I: <0.03 ng/mL (ref <0.03)

## 2016-08-30 LAB — HIV ANTIBODY (ROUTINE TESTING W REFLEX): HIV Screen 4th Generation wRfx: NONREACTIVE

## 2016-08-30 MED ORDER — SENNOSIDES-DOCUSATE SODIUM 8.6-50 MG PO TABS: 1.0000 | ORAL_TABLET | Freq: Two times a day (BID) | ORAL | Status: DC

## 2016-08-30 MED ORDER — ONDANSETRON HCL 4 MG PO TABS: 4.0000 mg | ORAL_TABLET | Freq: Every day | ORAL | 0 refills | Status: AC | PRN

## 2016-08-30 MED ORDER — METOPROLOL TARTRATE 75 MG PO TABS: 75.0000 mg | ORAL_TABLET | Freq: Two times a day (BID) | ORAL | 0 refills | Status: DC

## 2016-08-30 MED ORDER — SORBITOL 70 % SOLN: 30.0000 mL | Freq: Every day | Status: DC | PRN

## 2016-08-30 MED ORDER — BISACODYL 10 MG RE SUPP
10.0000 mg | Freq: Once | RECTAL | Status: AC
  Administered 2016-08-30: 10 mg via RECTAL
  Filled 2016-08-30: qty 1

## 2016-08-30 MED ORDER — SORBITOL 70 % SOLN: 15.0000 mL | Freq: Every day | Status: DC | PRN

## 2016-08-30 MED ORDER — INSULIN REGULAR HUMAN 100 UNIT/ML IJ SOLN: 5.0000 [IU] | Freq: Three times a day (TID) | INTRAMUSCULAR | Status: DC

## 2016-08-30 MED ORDER — FLUTICASONE-SALMETEROL 250-50 MCG/DOSE IN AEPB: 1.0000 | INHALATION_SPRAY | Freq: Two times a day (BID) | RESPIRATORY_TRACT | Status: DC

## 2016-08-30 NOTE — Progress Notes (Signed)
Portable abdominal xray completed

## 2016-08-30 NOTE — Discharge Summary (Signed)
Physician Discharge Summary  Susan Horton FWY:[id number] DOB: Jan 16, 1970 DOA: 08/29/2016  PCP: [name], MD  Admit date: 08/29/2016 Discharge date: 08/30/2016   Recommendations for Outpatient Follow-Up:   Needs to follow closely with her nephrologist dr. Jimmy Footman-- she has missed most recent appointment Close blood sugar management Cbc, BMp 1 week   Discharge Diagnosis:   Active Problems:   Gastroparesis   Essential hypertension, benign   Diabetes mellitus due to underlying condition, uncontrolled, with stage 4 chronic kidney disease, with long-term current use of insulin (HCC)   CKD (chronic kidney disease) stage 4, GFR 15-29 ml/min (HCC)   Chest pain   Discharge disposition:  Home.  Discharge Condition: Improved.  Diet recommendation: Low sodium, heart healthy.  Carbohydrate-modified.  Wound care: None.   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 Multiple episodes of vomiting yesterday evening which patient normally has her gastroparesis. Describes chest pain as a pressure. Rates it is moderate to severe. Didn't improve with nitroglycerin. No chest trauma. Has been compliant with her medications. Came to the emergency room for evaluation.   Hospital Course by Problem:   Constipation -bowel regimen for at home  CP -seen by cards: Atypical CP - Likely related to gastroparesis, vomiting. Trop normal, normal ECG. No further testing needed  Coronary artery disease Continue Plavix, Lopressor  Anemia of CKD -outpatient nephrology consult  Reflux Continue Protonix  Hypertension Continue home meds  Gastroparesis Continue Reglan -needs better blood sugar control (still drinks soda)  CKD Continue Rocaltrol, calcifediol -need close renal follow up -patient could not tell me why she has been missing appointments  Mood disorder Continue Remeron and  Cymbalta  Diabetes -resume home meds --needs dietary compliance  Hyperlipidemia Continue statin  Chronic pain Continue gabapentin     Medical Consultants:    cards   Discharge Exam:   Vitals:   08/30/16 0530 08/30/16 1243  BP: 116/61 (!) 147/72  Pulse: 78 78  Resp: 18   Temp: 98.4 F (36.9 C)   SpO2: 100% 100%   Vitals:   08/29/16 2045 08/30/16 0126 08/30/16 0530 08/30/16 1243  BP: (!) 150/66 (!) 119/58 116/61 (!) 147/72  Pulse: 84 83 78 78  Resp: 16 18 18    Temp: 98.5 F (36.9 C) 98.2 F (36.8 C) 98.4 F (36.9 C)   TempSrc: Oral Oral Oral   SpO2: 100% 100% 100% 100%  Weight: 75.7 kg (166 lb 12.8 oz)  76.2 kg (167 lb 14.4 oz)   Height: 5\' 5"  (1.651 m)       Gen:  NAD    The results of significant diagnostics from this hospitalization (including imaging, microbiology, ancillary and laboratory) are listed below for reference.     Procedures and Diagnostic Studies:   Dg Chest 2 View  Result Date: 08/29/2016 CLINICAL DATA:  Chest tightness. EXAM: CHEST  2 VIEW COMPARISON:  July 05, 2016 FINDINGS: The heart size and mediastinal contours are within normal limits. Both lungs are clear. The visualized skeletal structures are unremarkable. IMPRESSION: No active cardiopulmonary disease. Electronically Signed   By: Dorise Bullion III M.D   On: 08/29/2016 09:04     Labs:   Basic Metabolic Panel:  Recent Labs Lab 08/29/16 0839 08/29/16 2018 08/30/16 1003  NA 131*  --  138  K 4.6  --  4.5  CL 103  --  111  CO2 21*  --  21*  GLUCOSE 459*  --  148*  BUN 41*  --  48*  CREATININE 2.75* 2.94* 3.27*  CALCIUM 8.6*  --  8.2*   GFR Estimated Creatinine Clearance: 22 mL/min (A) (by C-G formula based on SCr of 3.27 mg/dL (H)). Liver Function Tests:  Recent Labs Lab 08/29/16 1022  AST 9*  ALT 8*  ALKPHOS 135*  BILITOT 0.4  PROT 7.2  ALBUMIN 2.7*    Recent Labs Lab 08/29/16 1022  LIPASE 39   No results for input(s): AMMONIA in the last 168  hours. Coagulation profile No results for input(s): INR, PROTIME in the last 168 hours.  CBC:  Recent Labs Lab 08/29/16 0839 08/29/16 2018 08/30/16 1003  WBC 9.9 11.3* 9.2  HGB 9.2* 8.9* 8.0*  HCT 29.0* 28.1* 25.6*  MCV 77.1* 77.4* 78.8  PLT 478* 476* 403*   Cardiac Enzymes:  Recent Labs Lab 08/29/16 2018 08/29/16 2257 08/30/16 0221  TROPONINI <0.03 <0.03 <0.03   BNP: Invalid input(s): POCBNP CBG:  Recent Labs Lab 08/29/16 1624 08/29/16 2140 08/30/16 0538 08/30/16 0747 08/30/16 1153  GLUCAP 137* 455* 207* 201* 194*   D-Dimer No results for input(s): DDIMER in the last 72 hours. Hgb A1c No results for input(s): HGBA1C in the last 72 hours. Lipid Profile No results for input(s): CHOL, HDL, LDLCALC, TRIG, CHOLHDL, LDLDIRECT in the last 72 hours. Thyroid function studies No results for input(s): TSH, T4TOTAL, T3FREE, THYROIDAB in the last 72 hours.  Invalid input(s): FREET3 Anemia work up No results for input(s): VITAMINB12, FOLATE, FERRITIN, TIBC, IRON, RETICCTPCT in the last 72 hours. Microbiology No results found for this or any previous visit (from the past 240 hour(s)).   Discharge Instructions:   Discharge Instructions    Diet - low sodium heart healthy    Complete by:  As directed    Diet Carb Modified    Complete by:  As directed    Discharge instructions    Complete by:  As directed    Bowel regimen for "toothpaste-like" consistancy   Increase activity slowly    Complete by:  As directed      Allergies as of 08/30/2016      Reactions   Penicillin G Anaphylaxis, Itching, Swelling, Rash   Penicillins Anaphylaxis, Itching, Swelling, Rash   Swelling of throat & whole mouth  Has patient had a PCN reaction causing immediate rash, facial/tongue/throat swelling, SOB or lightheadedness with hypotension: Yes Has patient had a PCN reaction causing severe rash involving mucus membranes or skin necrosis: No Has patient had a PCN reaction that  required hospitalization: No Has patient had a PCN reaction occurring within the last 10 years: No If all of the above answers are "NO", then may proceed with Cephalosporin use.   Lisinopril Cough      Medication List    TAKE these medications   albuterol 108 (90 Base) MCG/ACT inhaler Commonly known as:  PROVENTIL HFA;VENTOLIN HFA Inhale 1-2 puffs into the lungs every 6 (six) hours as needed for wheezing or shortness of breath.   amLODipine 10 MG tablet Commonly known as:  NORVASC Take 1 tablet (10 mg total) by mouth daily.   aspirin 81 MG chewable tablet Chew 1 tablet (81 mg total) by mouth daily.   calcitRIOL 0.25 MCG capsule Commonly known as:  ROCALTROL Take 0.25 mcg by mouth daily.   Capsaicin-Menthol-Methyl Sal 0.025-1-12 % Crea Commonly known as:  capsaicin-methyl sal-menthol Apply 1 application topically 4 (four) times daily as needed. For aches/pains,  apply to skin. May need gloves. What changed:  reasons to take this  additional instructions   clopidogrel 75 MG tablet Commonly known as:  PLAVIX Take 1 tablet (75 mg total) by mouth daily with breakfast.   DULoxetine 30 MG capsule Commonly known as:  CYMBALTA Take 1 capsule (30 mg total) by mouth daily. What changed:  when to take this   Fluticasone-Salmeterol 250-50 MCG/DOSE Aepb Commonly known as:  ADVAIR Inhale 1 puff into the lungs 2 (two) times daily. What changed:  when to take this  reasons to take this   gabapentin 300 MG capsule Commonly known as:  NEURONTIN Take 2 capsules (600 mg total) by mouth 3 (three) times daily.   glipiZIDE 10 MG tablet Commonly known as:  GLUCOTROL Take 1 tablet (10 mg total) by mouth 2 (two) times daily before a meal.   Insulin Glargine 300 UNIT/ML Sopn Commonly known as:  TOUJEO SOLOSTAR Inject 50 Units into the skin 2 (two) times daily. What changed:  when to take this   insulin regular 100 units/mL injection Commonly known as:  NOVOLIN R,HUMULIN R Inject  0.05-0.2 mLs (5-20 Units total) into the skin 3 (three) times daily before meals. Sliding scale What changed:  how much to take   Iron 325 (65 Fe) MG Tabs Take 325 mg by mouth daily.   losartan 100 MG tablet Commonly known as:  COZAAR Take 1 tablet (100 mg total) by mouth daily.   lovastatin 20 MG tablet Commonly known as:  MEVACOR Take 20 mg by mouth daily.   metoCLOPramide 5 MG tablet Commonly known as:  REGLAN Take 1 tablet (5 mg total) by mouth 4 (four) times daily -  before meals and at bedtime.   Metoprolol Tartrate 75 MG Tabs Take 75 mg by mouth 2 (two) times daily. What changed:  medication strength  how much to take  additional instructions  Another medication with the same name was removed. Continue taking this medication, and follow the directions you see here.   mirtazapine 15 MG disintegrating tablet Commonly known as:  REMERON SOL-TAB TAKE 1 TABLET BY MOUTH AT BEDTIME. What changed:  See the new instructions.   montelukast 10 MG tablet Commonly known as:  SINGULAIR Take 10 mg by mouth daily as needed (allergies).   nitroGLYCERIN 0.4 MG SL tablet Commonly known as:  NITROSTAT Place 1 tablet (0.4 mg total) under the tongue every 5 (five) minutes as needed.   ondansetron 4 MG tablet Commonly known as:  ZOFRAN Take 1 tablet (4 mg total) by mouth daily as needed for nausea or vomiting.   pantoprazole 40 MG tablet Commonly known as:  PROTONIX Take 1 tablet (40 mg total) by mouth daily.   RAYALDEE 30 MCG Cpcr Generic drug:  Calcifediol ER Take 30 mcg by mouth daily.   senna-docusate 8.6-50 MG tablet Commonly known as:  Senokot-S Take 1 tablet by mouth 2 (two) times daily.      Follow-up Information    [name], MD Follow up in 1 week(s).   Specialty:  Internal Medicine Contact information: [address] [place] [address] [phone]        dr deterding-need appointment for BMP in 1 week re Cr Follow up.             Time coordinating discharge: 35 min  Signed:  Dareion Kneece U Kylen Ismael   [facility] 08/30/2016, 3:58 PM

## 2016-08-30 NOTE — Progress Notes (Signed)
Progress Note  Patient Name: Susan Horton Date of Encounter: 08/30/2016  Primary Cardiologist: Dr. Gwenlyn Found  Subjective   No further chest pain or N&V.  Soreness in chest and abd from vomiting.   Inpatient Medications    Scheduled Meds: . amLODipine  10 mg Oral Daily  . aspirin  324 mg Oral Daily  . calcitRIOL  0.25 mcg Oral Daily  . clopidogrel  75 mg Oral Q breakfast  . DULoxetine  30 mg Oral QPM  . gabapentin  600 mg Oral TID  . heparin  5,000 Units Subcutaneous Q8H  . insulin aspart  0-15 Units Subcutaneous TID WC  . insulin glargine  50 Units Subcutaneous BID PC  . losartan  100 mg Oral Daily  . metoCLOPramide  5 mg Oral TID AC & HS  . metoprolol tartrate  75 mg Oral BID  . mirtazapine  15 mg Oral QHS  . pantoprazole  40 mg Oral Daily  . pravastatin  20 mg Oral q1800   Continuous Infusions: . sodium chloride     PRN Meds: acetaminophen, albuterol, bisacodyl, hydrALAZINE, morphine injection, ondansetron (ZOFRAN) IV, zolpidem   Vital Signs    Vitals:   08/29/16 1957 08/29/16 2045 08/30/16 0126 08/30/16 0530  BP: (!) 142/81 (!) 150/66 (!) 119/58 116/61  Pulse: 90 84 83 78  Resp: 13 16 18 18   Temp:  98.5 F (36.9 C) 98.2 F (36.8 C) 98.4 F (36.9 C)  TempSrc:  Oral Oral Oral  SpO2: 100% 100% 100% 100%  Weight:  166 lb 12.8 oz (75.7 kg)  167 lb 14.4 oz (76.2 kg)  Height:  5\' 5"  (1.651 m)      Intake/Output Summary (Last 24 hours) at 08/30/16 0807 Last data filed at 08/30/16 0600  Gross per 24 hour  Intake              480 ml  Output              250 ml  Net              230 ml   Filed Weights   08/29/16 0840 08/29/16 2045 08/30/16 0530  Weight: 168 lb (76.2 kg) 166 lb 12.8 oz (75.7 kg) 167 lb 14.4 oz (76.2 kg)    Telemetry    SR - Personally Reviewed  ECG    SR and no acute changes - Personally Reviewed  Physical Exam   GEN: No acute distress.   Neck: No JVD Cardiac: RRR, no murmurs, rubs, or gallops.  Respiratory: Clear to auscultation  bilaterally. GI: Soft, nontender, non-distended  MS: No edema; No deformity. Neuro:  Nonfocal  Psych: Normal affect   Labs    Chemistry Recent Labs Lab 08/29/16 0839 08/29/16 1022 08/29/16 2018  NA 131*  --   --   K 4.6  --   --   CL 103  --   --   CO2 21*  --   --   GLUCOSE 459*  --   --   BUN 41*  --   --   CREATININE 2.75*  --  2.94*  CALCIUM 8.6*  --   --   PROT  --  7.2  --   ALBUMIN  --  2.7*  --   AST  --  9*  --   ALT  --  8*  --   ALKPHOS  --  135*  --   BILITOT  --  0.4  --  GFRNONAA 20*  --  18*  GFRAA 23*  --  21*  ANIONGAP 7  --   --      Hematology Recent Labs Lab 08/29/16 0839 08/29/16 2018  WBC 9.9 11.3*  RBC 3.76* 3.63*  HGB 9.2* 8.9*  HCT 29.0* 28.1*  MCV 77.1* 77.4*  MCH 24.5* 24.5*  MCHC 31.7 31.7  RDW 13.1 13.0  PLT 478* 476*    Cardiac Enzymes Recent Labs Lab 08/29/16 2018 08/29/16 2257 08/30/16 0221  TROPONINI <0.03 <0.03 <0.03    Recent Labs Lab 08/29/16 0850 08/29/16 1221  TROPIPOC 0.00 0.00     BNPNo results for input(s): BNP, PROBNP in the last 168 hours.   DDimer No results for input(s): DDIMER in the last 168 hours.   Radiology    Dg Chest 2 View  Result Date: 08/29/2016 CLINICAL DATA:  Chest tightness. EXAM: CHEST  2 VIEW COMPARISON:  July 05, 2016 FINDINGS: The heart size and mediastinal contours are within normal limits. Both lungs are clear. The visualized skeletal structures are unremarkable. IMPRESSION: No active cardiopulmonary disease. Electronically Signed   By: Dorise Bullion III M.D   On: 08/29/2016 09:04    Cardiac Studies   None this admit except as above.  Patient Profile     46 y.o. female with a history of Type 2 diabetes mellitus, hypertension, gastroparesis, and recently diagnosed CAD status post DES to the mid LAD in July of this year who is being seen today for the evaluation of chest pain at the request of Dr. Johnney Killian.   Assessment & Plan    Chest pain with neg troponins, neg  MI. In setting of hyperglycemia and emesis with gastroparesis.  ECG shows no acute ST segment changes  --no further complaints this AM, ambulate- Dr. Marlou Porch to see.  CAD with recent DES to [place] on 08/05/16 with moderate residual RCA disease managed medically   Uncontrolled type 2 DM glucose 459 in ER   Gastroparesis with recurrent nausea and vomiting in setting of DM  HTN, Essential   Controlled this AM  CKD stage 3-4 with Cr 2.75 this is her normal in July followed by renal  Signed, Cecilie Kicks, NP  08/30/2016, 8:07 AM    Personally seen and examined. Agree with above.  Feeling better RRR, CTAB, alert  Atypical CP - Likely related to gastroparesis, vomiting. Trop normal, normal ECG. No further testing needed.  CAD - prior LAD disease - continue with med mgt.  DM - uncontrolled--gastroparesis  Reassurance from cardiac perspective  OK to DC from CV perspective.   Candee Furbish, MD

## 2016-08-30 NOTE — Progress Notes (Signed)
Reviewed all discharge instructions with patient, including new meds with prescriptions and changes to medications and as needed and scheduled meds.  Patient stated understanding of all, including f/u appointments.  She confirmed she has cell phone, charger, clothes and jewelry with her that she brought to hospital.  No voiced complaints.

## 2016-09-06 MED FILL — ?CLOPIDOGREL 75 MG TABLET: 75 | 30 days supply | Qty: 30 | Fill #1

## 2016-09-08 ENCOUNTER — Encounter: Payer: Self-pay | Admitting: Internal Medicine

## 2016-09-08 ENCOUNTER — Other Ambulatory Visit: Payer: Self-pay | Admitting: Internal Medicine

## 2016-09-08 ENCOUNTER — Ambulatory Visit: Payer: Medicaid Other | Attending: Internal Medicine | Admitting: Internal Medicine

## 2016-09-08 VITALS — BP 153/83 | HR 81 | Temp 97.6°F | Resp 18 | Ht 65.0 in | Wt 170.0 lb

## 2016-09-08 DIAGNOSIS — E1122 Type 2 diabetes mellitus with diabetic chronic kidney disease: Secondary | ICD-10-CM | POA: Diagnosis not present

## 2016-09-08 DIAGNOSIS — F4321 Adjustment disorder with depressed mood: Secondary | ICD-10-CM

## 2016-09-08 DIAGNOSIS — E1143 Type 2 diabetes mellitus with diabetic autonomic (poly)neuropathy: Secondary | ICD-10-CM | POA: Diagnosis not present

## 2016-09-08 DIAGNOSIS — I10 Essential (primary) hypertension: Secondary | ICD-10-CM

## 2016-09-08 DIAGNOSIS — N184 Chronic kidney disease, stage 4 (severe): Secondary | ICD-10-CM | POA: Diagnosis not present

## 2016-09-08 DIAGNOSIS — Z86718 Personal history of other venous thrombosis and embolism: Secondary | ICD-10-CM | POA: Diagnosis not present

## 2016-09-08 DIAGNOSIS — I2510 Atherosclerotic heart disease of native coronary artery without angina pectoris: Secondary | ICD-10-CM | POA: Insufficient documentation

## 2016-09-08 DIAGNOSIS — Z79899 Other long term (current) drug therapy: Secondary | ICD-10-CM | POA: Diagnosis not present

## 2016-09-08 DIAGNOSIS — G43909 Migraine, unspecified, not intractable, without status migrainosus: Secondary | ICD-10-CM | POA: Diagnosis not present

## 2016-09-08 DIAGNOSIS — J45909 Unspecified asthma, uncomplicated: Secondary | ICD-10-CM | POA: Insufficient documentation

## 2016-09-08 DIAGNOSIS — E1165 Type 2 diabetes mellitus with hyperglycemia: Secondary | ICD-10-CM | POA: Diagnosis not present

## 2016-09-08 DIAGNOSIS — E1142 Type 2 diabetes mellitus with diabetic polyneuropathy: Secondary | ICD-10-CM | POA: Insufficient documentation

## 2016-09-08 DIAGNOSIS — K219 Gastro-esophageal reflux disease without esophagitis: Secondary | ICD-10-CM | POA: Insufficient documentation

## 2016-09-08 DIAGNOSIS — I129 Hypertensive chronic kidney disease with stage 1 through stage 4 chronic kidney disease, or unspecified chronic kidney disease: Secondary | ICD-10-CM | POA: Diagnosis not present

## 2016-09-08 DIAGNOSIS — Z794 Long term (current) use of insulin: Secondary | ICD-10-CM | POA: Insufficient documentation

## 2016-09-08 DIAGNOSIS — Z7982 Long term (current) use of aspirin: Secondary | ICD-10-CM | POA: Insufficient documentation

## 2016-09-08 DIAGNOSIS — IMO0002 Reserved for concepts with insufficient information to code with codable children: Secondary | ICD-10-CM

## 2016-09-08 DIAGNOSIS — K3184 Gastroparesis: Secondary | ICD-10-CM

## 2016-09-08 LAB — POCT GLYCOSYLATED HEMOGLOBIN (HGB A1C): HEMOGLOBIN A1C: 11.7

## 2016-09-08 LAB — GLUCOSE, POCT (MANUAL RESULT ENTRY): POC GLUCOSE: 313 mg/dL — AB (ref 70–99)

## 2016-09-08 MED ORDER — INSULIN GLARGINE 300 UNIT/ML ~~LOC~~ SOPN: 60.0000 [IU] | PEN_INJECTOR | Freq: Two times a day (BID) | SUBCUTANEOUS | 5 refills | Status: DC

## 2016-09-08 NOTE — Progress Notes (Signed)
Susan Horton, is a 46 y.o. female  FIE:[id number]  ZYS:[id number]  DOB - 01-Oct-1970  Chief Complaint  Patient presents with  . Follow-up      Subjective:   Susan Horton is a 46 y.o. female with history of uncontrolled DM, HTN, nicotine use disorder,Asthma, and CKD stage 3 who presents here today for a hospital discharge follow up visit. Patient was admitted for chest pain on 08/05/2016, she underwent LHC with Dr. Gwenlyn Found on 08/05/16 with successful mLAD PCI and DES, and moderate mRCA stenosis that was treated medically. Normal EF reported. Post cath labs showed stable Cr 2.52, Hgb 9.1. No further chest pain reported. Worked well with cardiac rehab with no complications. Noted to be mildly tachycardic and metoprolol was increased from 25mg  BID to 50mg  BID with improvement in HR. She was seen by Dr. Ellyn Hack and determined stable for discharge home. Follow up in the office has been arranged. She was admitted again for chest pain and vomiting on 08/27/2016, felt to be related mostly to gastritis/gastroparesis, discharged home after ruled out.   She has no new symptom today. She has scheduled follow up visits with multiple specialists including Nephrologist, gastroenterologist and Cardiologist. She is currently on 50 units of Lantus insulin BID but blood sugar remains uncontrolled, she is also complaining of worsening neuropathic pain from diabetes. She has no episode of hypoglycemia. She still vomits daily but able to holds some food down. She reports no chest pain. No fever. No dizziness. Patient has No headache, No abdominal pain, No new weakness tingling or numbness, No Cough - SOB.  Problem  Adjustment Disorder With Depressed Mood  Medication Management   ALLERGIES: Allergies  Allergen Reactions  . Penicillin G Anaphylaxis, Itching, Swelling and Rash  . Penicillins Anaphylaxis, Itching, Swelling and Rash    Swelling of throat & whole mouth  Has patient had a PCN reaction causing immediate rash,  facial/tongue/throat swelling, SOB or lightheadedness with hypotension: Yes Has patient had a PCN reaction causing severe rash involving mucus membranes or skin necrosis: No Has patient had a PCN reaction that required hospitalization: No Has patient had a PCN reaction occurring within the last 10 years: No If all of the above answers are "NO", then may proceed with Cephalosporin use.  [name] Lisinopril Cough    PAST MEDICAL HISTORY: Past Medical History:  Diagnosis Date  . Asthma   . CAD (coronary artery disease)    DES to mid LAD July 2018, residual moderate RCA disease  . Cataract   . CKD (chronic kidney disease) stage 4, GFR 15-29 ml/min (HCC)   . DVT (deep venous thrombosis) ([place])    20+ yrs ago (during pregnancy), 2015  . Essential hypertension 12/11/2015  . Gastroparesis   . GERD (gastroesophageal reflux disease)   . Hidradenitis   . Migraine   . Type 2 diabetes mellitus ([place])     MEDICATIONS AT HOME: Prior to Admission medications   Medication Sig Start Date End Date Taking? Authorizing Provider  albuterol (PROVENTIL HFA;VENTOLIN HFA) 108 (90 BASE) MCG/ACT inhaler Inhale 1-2 puffs into the lungs every 6 (six) hours as needed for wheezing or shortness of breath.     [provider]  amLODipine (NORVASC) 10 MG tablet Take 1 tablet (10 mg total) by mouth daily. 01/21/16   [name], MD  aspirin 81 MG chewable tablet Chew 1 tablet (81 mg total) by mouth daily. 08/07/16   Cheryln Manly, NP  Calcifediol ER (RAYALDEE) 30 MCG CPCR Take  30 mcg by mouth daily.     [provider]  calcitRIOL (ROCALTROL) 0.25 MCG capsule Take 0.25 mcg by mouth daily. 05/26/16   [provider]  Capsaicin-Menthol-Methyl Sal (CAPSAICIN-METHYL SAL-MENTHOL) 0.025-1-12 % CREA Apply 1 application topically 4 (four) times daily as needed. For aches/pains, apply to skin. May need gloves. Patient taking differently: Apply 1 application topically 4 (four) times daily as needed  (for aches or pains (may need gloves)).  02/24/16   Maren Reamer, MD  clopidogrel (PLAVIX) 75 MG tablet Take 1 tablet (75 mg total) by mouth daily with breakfast. 08/07/16   Reino Bellis B, NP  DULoxetine (CYMBALTA) 30 MG capsule Take 1 capsule (30 mg total) by mouth daily. Patient taking differently: Take 30 mg by mouth every evening.  05/26/16   [name], MD  Ferrous Sulfate (IRON) 325 (65 Fe) MG TABS Take 325 mg by mouth daily. 11/25/15   Brayton Caves, PA-C  Fluticasone-Salmeterol (ADVAIR) 250-50 MCG/DOSE AEPB Inhale 1 puff into the lungs 2 (two) times daily. 08/30/16   Geradine Girt, DO  gabapentin (NEURONTIN) 300 MG capsule Take 2 capsules (600 mg total) by mouth 3 (three) times daily. 03/10/16   [name], MD  glipiZIDE (GLUCOTROL) 10 MG tablet Take 1 tablet (10 mg total) by mouth 2 (two) times daily before a meal. 05/26/16   Ieisha Gao E, MD  Insulin Glargine (TOUJEO SOLOSTAR) 300 UNIT/ML SOPN Inject 60 Units into the skin 2 (two) times daily. 09/08/16   [name], MD  insulin regular (NOVOLIN R,HUMULIN R) 100 units/mL injection Inject 0.05-0.2 mLs (5-20 Units total) into the skin 3 (three) times daily before meals. Sliding scale 08/30/16   Eulogio Bear U, DO  losartan (COZAAR) 100 MG tablet Take 1 tablet (100 mg total) by mouth daily. 01/21/16   [name], MD  lovastatin (MEVACOR) 20 MG tablet Take 20 mg by mouth daily.    [provider]  metoCLOPramide (REGLAN) 5 MG tablet Take 1 tablet (5 mg total) by mouth 4 (four) times daily -  before meals and at bedtime. 01/21/16   [name], MD  metoprolol tartrate 75 MG TABS Take 75 mg by mouth 2 (two) times daily. 08/30/16   Geradine Girt, DO  mirtazapine (REMERON SOL-TAB) 15 MG disintegrating tablet TAKE 1 TABLET BY MOUTH AT BEDTIME. Patient taking differently: TAKE 15mg  BY MOUTH AT BEDTIME. 05/26/16   [name], MD  montelukast (SINGULAIR) 10 MG tablet Take  10 mg by mouth daily as needed (allergies).     [provider]  nitroGLYCERIN (NITROSTAT) 0.4 MG SL tablet Place 1 tablet (0.4 mg total) under the tongue every 5 (five) minutes as needed. 08/06/16   Cheryln Manly, NP  ondansetron (ZOFRAN) 4 MG tablet Take 1 tablet (4 mg total) by mouth daily as needed for nausea or vomiting. 08/30/16 08/30/17  Geradine Girt, DO  pantoprazole (PROTONIX) 40 MG tablet Take 1 tablet (40 mg total) by mouth daily. 08/07/16   Cheryln Manly, NP  senna-docusate (SENOKOT-S) 8.6-50 MG tablet Take 1 tablet by mouth 2 (two) times daily. 08/30/16   Geradine Girt, DO    Objective:   Vitals:   09/08/16 1027  BP: (!) 153/83  Pulse: 81  Resp: 18  Temp: 97.6 F (36.4 C)  TempSrc: Oral  SpO2: 100%  Weight: 170 lb (77.1 kg)  Height: 5\' 5"  (1.651 m)   Exam General appearance : Awake, alert,  not in any distress. Speech Clear. Not toxic looking HEENT: Atraumatic and Normocephalic, pupils equally reactive to light and accomodation Neck: Supple, no JVD. No cervical lymphadenopathy.  Chest: Good air entry bilaterally, no added sounds  CVS: S1 S2 regular, no murmurs.  Abdomen: Bowel sounds present, Non tender and not distended with no gaurding, rigidity or rebound. Extremities: B/L Lower Ext shows no edema, both legs are warm to touch Neurology: Awake alert, and oriented X 3, CN II-XII intact, Non focal Skin: No Rash  Data Review Lab Results  Component Value Date   HGBA1C 11.7 09/08/2016   HGBA1C 14.5 05/26/2016   HGBA1C >15.0 03/10/2016    Assessment & Plan   1. Uncontrolled type 2 diabetes mellitus with stage 4 chronic kidney disease, with long-term current use of insulin (HCC)  - POCT A1C - Glucose (CBG) Increase - Insulin Glargine (TOUJEO SOLOSTAR) 300 UNIT/ML SOPN; Inject 60 Units into the skin 2 (two) times daily.  Dispense: 9 pen; Refill: 5  2. Essential hypertension  We have discussed target BP range and blood pressure goal. I have  advised patient to check BP regularly and to call us back or report to clinic if the numbers are consistently higher than 140/90. We discussed the importance of compliance with medical therapy and DASH diet recommended, consequences of uncontrolled hypertension discussed.  - continue current BP medications  3. Adjustment disorder with depressed mood  - Continue Cymbalta  4. Medication management  - Increase Lantus Insulin to 60 units BID  - Continue other medications  5. Diabetic gastroparesis ([place])  - Patient is still vomiting daily, continue Zofran and Reglan prn  Patient have been counseled extensively about nutrition and exercise. Other issues discussed during this visit include: low cholesterol diet, weight control and daily exercise, foot care, annual eye examinations at Ophthalmology, importance of adherence with medications and regular follow-up. We also discussed long term complications of uncontrolled diabetes and hypertension.   Return in about 3 months (around 12/09/2016) for Hemoglobin A1C and Follow up, DM, Follow up HTN, Follow up Pain and comorbidities.  The patient was given clear instructions to go to ER or return to medical center if symptoms don't improve, worsen or new problems develop. The patient verbalized understanding. The patient was told to call to get lab results if they haven't heard anything in the next week.   This note has been created with Surveyor, quantity. Any transcriptional errors are unintentional.    Angelica Chessman, MD, [place], Karilyn Cota, [place] and [place], [place]   09/08/2016, 11:16 AM

## 2016-09-08 NOTE — Patient Instructions (Signed)
Diabetes and Foot Care Diabetes may cause you to have problems because of poor blood supply (circulation) to your feet and legs. This may cause the skin on your feet to become thinner, break easier, and heal more slowly. Your skin may become dry, and the skin may peel and crack. You may also have nerve damage in your legs and feet causing decreased feeling in them. You may not notice minor injuries to your feet that could lead to infections or more serious problems. Taking care of your feet is one of the most important things you can do for yourself. Follow these instructions at home:  Wear shoes at all times, even in the house. Do not go barefoot. Bare feet are easily injured.  Check your feet daily for blisters, cuts, and redness. If you cannot see the bottom of your feet, use a mirror or ask someone for help.  Wash your feet with warm water (do not use hot water) and mild soap. Then pat your feet and the areas between your toes until they are completely dry. Do not soak your feet as this can dry your skin.  Apply a moisturizing lotion or petroleum jelly (that does not contain alcohol and is unscented) to the skin on your feet and to dry, brittle toenails. Do not apply lotion between your toes.  Trim your toenails straight across. Do not dig under them or around the cuticle. File the edges of your nails with an emery board or nail file.  Do not cut corns or calluses or try to remove them with medicine.  Wear clean socks or stockings every day. Make sure they are not too tight. Do not wear knee-high stockings since they may decrease blood flow to your legs.  Wear shoes that fit properly and have enough cushioning. To break in new shoes, wear them for just a few hours a day. This prevents you from injuring your feet. Always look in your shoes before you put them on to be sure there are no objects inside.  Do not cross your legs. This may decrease the blood flow to your feet.  If you find a  minor scrape, cut, or break in the skin on your feet, keep it and the skin around it clean and dry. These areas may be cleansed with mild soap and water. Do not cleanse the area with peroxide, alcohol, or iodine.  When you remove an adhesive bandage, be sure not to damage the skin around it.  If you have a wound, look at it several times a day to make sure it is healing.  Do not use heating pads or hot water bottles. They may burn your skin. If you have lost feeling in your feet or legs, you may not know it is happening until it is too late.  Make sure your health care provider performs a complete foot exam at least annually or more often if you have foot problems. Report any cuts, sores, or bruises to your health care provider immediately. Contact a health care provider if:  You have an injury that is not healing.  You have cuts or breaks in the skin.  You have an ingrown nail.  You notice redness on your legs or feet.  You feel burning or tingling in your legs or feet.  You have pain or cramps in your legs and feet.  Your legs or feet are numb.  Your feet always feel cold. Get help right away if:  There is increasing   redness, swelling, or pain in or around a wound.  There is a red line that goes up your leg.  Pus is coming from a wound.  You develop a fever or as directed by your health care provider.  You notice a bad smell coming from an ulcer or wound. This information is not intended to replace advice given to you by your health care provider. Make sure you discuss any questions you have with your health care provider. Document Released: 12/26/1999 Document Revised: 06/05/2015 Document Reviewed: 06/06/2012 Elsevier Interactive Patient Education  2017 [place]. Diabetes Mellitus and Exercise Exercising regularly is important for your overall health, especially when you have diabetes (diabetes mellitus). Exercising is not only about losing weight. It has many health  benefits, such as increasing muscle strength and bone density and reducing body fat and stress. This leads to improved fitness, flexibility, and endurance, all of which result in better overall health. Exercise has additional benefits for people with diabetes, including:  Reducing appetite.  Helping to lower and control blood glucose.  Lowering blood pressure.  Helping to control amounts of fatty substances (lipids) in the blood, such as cholesterol and triglycerides.  Helping the body to respond better to insulin (improving insulin sensitivity).  Reducing how much insulin the body needs.  Decreasing the risk for heart disease by: ? Lowering cholesterol and triglyceride levels. ? Increasing the levels of good cholesterol. ? Lowering blood glucose levels.  What is my activity plan? Your health care provider or certified diabetes educator can help you make a plan for the type and frequency of exercise (activity plan) that works for you. Make sure that you:  Do at least 150 minutes of moderate-intensity or vigorous-intensity exercise each week. This could be brisk walking, biking, or water aerobics. ? Do stretching and strength exercises, such as yoga or weightlifting, at least 2 times a week. ? Spread out your activity over at least 3 days of the week.  Get some form of physical activity every day. ? Do not go more than 2 days in a row without some kind of physical activity. ? Avoid being inactive for more than 90 minutes at a time. Take frequent breaks to walk or stretch.  Choose a type of exercise or activity that you enjoy, and set realistic goals.  Start slowly, and gradually increase the intensity of your exercise over time.  What do I need to know about managing my diabetes?  Check your blood glucose before and after exercising. ? If your blood glucose is higher than 240 mg/dL (13.3 mmol/L) before you exercise, check your urine for ketones. If you have ketones in your urine,  do not exercise until your blood glucose returns to normal.  Know the symptoms of low blood glucose (hypoglycemia) and how to treat it. Your risk for hypoglycemia increases during and after exercise. Common symptoms of hypoglycemia can include: ? Hunger. ? Anxiety. ? Sweating and feeling clammy. ? Confusion. ? Dizziness or feeling light-headed. ? Increased heart rate or palpitations. ? Blurry vision. ? Tingling or numbness around the mouth, lips, or tongue. ? Tremors or shakes. ? Irritability.  Keep a rapid-acting carbohydrate snack available before, during, and after exercise to help prevent or treat hypoglycemia.  Avoid injecting insulin into areas of the body that are going to be exercised. For example, avoid injecting insulin into: ? The arms, when playing tennis. ? The legs, when jogging.  Keep records of your exercise habits. Doing this can help you and  your health care provider adjust your diabetes management plan as needed. Write down: ? Food that you eat before and after you exercise. ? Blood glucose levels before and after you exercise. ? The type and amount of exercise you have done. ? When your insulin is expected to peak, if you use insulin. Avoid exercising at times when your insulin is peaking.  When you start a new exercise or activity, work with your health care provider to make sure the activity is safe for you, and to adjust your insulin, medicines, or food intake as needed.  Drink plenty of water while you exercise to prevent dehydration or heat stroke. Drink enough fluid to keep your urine clear or pale yellow. This information is not intended to replace advice given to you by your health care provider. Make sure you discuss any questions you have with your health care provider. Document Released: 03/20/2003 Document Revised: 07/18/2015 Document Reviewed: 06/09/2015 Elsevier Interactive Patient Education  2018 [place]. Blood Glucose Monitoring,  Adult Monitoring your blood sugar (glucose) helps you manage your diabetes. It also helps you and your health care provider determine how well your diabetes management plan is working. Blood glucose monitoring involves checking your blood glucose as often as directed, and keeping a record (log) of your results over time. Why should I monitor my blood glucose? Checking your blood glucose regularly can:  Help you understand how food, exercise, illnesses, and medicines affect your blood glucose.  Let you know what your blood glucose is at any time. You can quickly tell if you are having low blood glucose (hypoglycemia) or high blood glucose (hyperglycemia).  Help you and your health care provider adjust your medicines as needed.  When should I check my blood glucose? Follow instructions from your health care provider about how often to check your blood glucose. This may depend on:  The type of diabetes you have.  How well-controlled your diabetes is.  Medicines you are taking.  If you have type 1 diabetes:  Check your blood glucose at least 2 times a day.  Also check your blood glucose: ? Before every insulin injection. ? Before and after exercise. ? Between meals. ? 2 hours after a meal. ? Occasionally between 2:00 a.m. and 3:00 a.m., as directed. ? Before potentially dangerous tasks, like driving or using heavy machinery. ? At bedtime.  You may need to check your blood glucose more often, up to 6-10 times a day: ? If you use an insulin pump. ? If you need multiple daily injections (MDI). ? If your diabetes is not well-controlled. ? If you are ill. ? If you have a history of severe hypoglycemia. ? If you have a history of not knowing when your blood glucose is getting low (hypoglycemia unawareness). If you have type 2 diabetes:  If you take insulin or other diabetes medicines, check your blood glucose at least 2 times a day.  If you are on intensive insulin therapy, check  your blood glucose at least 4 times a day. Occasionally, you may also need to check between 2:00 a.m. and 3:00 a.m., as directed.  Also check your blood glucose: ? Before and after exercise. ? Before potentially dangerous tasks, like driving or using heavy machinery.  You may need to check your blood glucose more often if: ? Your medicine is being adjusted. ? Your diabetes is not well-controlled. ? You are ill. What is a blood glucose log?  A blood glucose log is a record of  your blood glucose readings. It helps you and your health care provider: ? Look for patterns in your blood glucose over time. ? Adjust your diabetes management plan as needed.  Every time you check your blood glucose, write down your result and notes about things that may be affecting your blood glucose, such as your diet and exercise for the day.  Most glucose meters store a record of glucose readings in the meter. Some meters allow you to download your records to a computer. How do I check my blood glucose? Follow these steps to get accurate readings of your blood glucose: Supplies needed   Blood glucose meter.  Test strips for your meter. Each meter has its own strips. You must use the strips that come with your meter.  A needle to prick your finger (lancet). Do not use lancets more than once.  A device that holds the lancet (lancing device).  A journal or log book to write down your results. Procedure  Wash your hands with soap and water.  Prick the side of your finger (not the tip) with the lancet. Use a different finger each time.  Gently rub the finger until a small drop of blood appears.  Follow instructions that come with your meter for inserting the test strip, applying blood to the strip, and using your blood glucose meter.  Write down your result and any notes. Alternative testing sites  Some meters allow you to use areas of your body other than your finger (alternative sites) to test  your blood.  If you think you may have hypoglycemia, or if you have hypoglycemia unawareness, do not use alternative sites. Use your finger instead.  Alternative sites may not be as accurate as the fingers, because blood flow is slower in these areas. This means that the result you get may be delayed, and it may be different from the result that you would get from your finger.  The most common alternative sites are: ? Forearm. ? Thigh. ? Palm of the hand. Additional tips  Always keep your supplies with you.  If you have questions or need help, all blood glucose meters have a 24-hour "hotline" number that you can call. You may also contact your health care provider.  After you use a few boxes of test strips, adjust (calibrate) your blood glucose meter by following instructions that came with your meter. This information is not intended to replace advice given to you by your health care provider. Make sure you discuss any questions you have with your health care provider. Document Released: 12/31/2002 Document Revised: 07/18/2015 Document Reviewed: 06/09/2015 Elsevier Interactive Patient Education  2017 [employer].

## 2016-09-30 ENCOUNTER — Encounter (HOSPITAL_COMMUNITY): Payer: Self-pay

## 2016-10-07 ENCOUNTER — Encounter (HOSPITAL_COMMUNITY): Payer: Medicaid Other

## 2016-10-14 MED FILL — AMLODIPINE BESYLATE 10 MG T: 10 | 30 days supply | Qty: 30 | Fill #3

## 2016-10-14 MED FILL — DULoxetine HCL 30 MG CPEP: 30 | 30 days supply | Qty: 30 | Fill #2

## 2016-10-14 MED FILL — LOSARTAN POTASSIUM 100 MG T: 100 | 30 days supply | Qty: 30 | Fill #3

## 2016-10-14 MED FILL — PANTOPRAZOLE SOD DR 40 MG T: 40 | 30 days supply | Qty: 30 | Fill #1

## 2016-10-14 MED FILL — MIRTAZAPINE 15 MG ODT: 15 | 30 days supply | Qty: 30 | Fill #0

## 2016-10-14 MED FILL — NITROSTAT 0.4 MG TABLET SL: 0.4 | 25 days supply | Qty: 25 | Fill #1

## 2016-10-14 MED FILL — glipiZIDE 10 MG TABS: 10 | 30 days supply | Qty: 60 | Fill #2

## 2016-10-14 MED FILL — CALCITRIOL 0.25 MCG CAPSULE: 0.25 | 31 days supply | Qty: 31 | Fill #3

## 2016-10-18 ENCOUNTER — Telehealth: Payer: Self-pay | Admitting: *Deleted

## 2016-10-18 MED ORDER — METOPROLOL SUCCINATE ER 100 MG PO TB24: 100.0000 mg | ORAL_TABLET | Freq: Every day | ORAL | 6 refills | Status: DC

## 2016-10-18 NOTE — Telephone Encounter (Signed)
Patients Metoprolol was changed by kidney specialist to ER. Per PCP prescription was ordered and sent to [facility] pharmacy.

## 2016-10-19 ENCOUNTER — Other Ambulatory Visit: Payer: Self-pay | Admitting: Pharmacist

## 2016-10-19 MED ORDER — INSULIN REGULAR HUMAN 100 UNIT/ML IJ SOLN: INTRAMUSCULAR | 3 refills | Status: DC

## 2016-10-19 NOTE — Telephone Encounter (Signed)
Received prior authorization request for Toujeo and Novolin N.  Toujeo approved Prior Approval #:[id number]  Humulin products preferred - switched Novolin R to Humulin R, same dose.

## 2016-10-27 ENCOUNTER — Other Ambulatory Visit (HOSPITAL_COMMUNITY): Payer: Self-pay | Admitting: *Deleted

## 2016-10-28 ENCOUNTER — Encounter (HOSPITAL_COMMUNITY)
Admission: RE | Admit: 2016-10-28 | Discharge: 2016-10-28 | Disposition: A | Discharge status: Home / Self Care | Payer: Medicaid Other | Source: Ambulatory Visit | Attending: Nephrology | Admitting: Nephrology

## 2016-10-28 DIAGNOSIS — N189 Chronic kidney disease, unspecified: Secondary | ICD-10-CM | POA: Insufficient documentation

## 2016-10-28 DIAGNOSIS — D631 Anemia in chronic kidney disease: Secondary | ICD-10-CM | POA: Diagnosis present

## 2016-10-28 MED ORDER — SODIUM CHLORIDE 0.9 % IV SOLN
510.0000 mg | Freq: Once | INTRAVENOUS | Status: AC
  Administered 2016-10-28: 510 mg via INTRAVENOUS
  Filled 2016-10-28: qty 17

## 2016-10-28 MED FILL — ?CLOPIDOGREL 75MG TAB: 75 | 30 days supply | Qty: 30 | Fill #2

## 2016-10-28 NOTE — Discharge Instructions (Signed)

## 2016-10-29 ENCOUNTER — Ambulatory Visit
Admission: RE | Admit: 2016-10-29 | Discharge: 2016-10-29 | Disposition: A | Discharge status: Home / Self Care | Payer: Medicaid Other | Source: Ambulatory Visit | Attending: Internal Medicine | Admitting: Internal Medicine

## 2016-10-29 DIAGNOSIS — Z1239 Encounter for other screening for malignant neoplasm of breast: Secondary | ICD-10-CM

## 2016-10-29 MED FILL — HumuLIN R 100 UNIT/ML SOLN: 100 | 30 days supply | Qty: 20 | Fill #0

## 2016-11-01 ENCOUNTER — Other Ambulatory Visit: Payer: Self-pay | Admitting: Internal Medicine

## 2016-11-01 DIAGNOSIS — R928 Other abnormal and inconclusive findings on diagnostic imaging of breast: Secondary | ICD-10-CM

## 2016-11-02 MED FILL — ?CLOPIDOGREL 75MG TAB: 75 | 30 days supply | Qty: 30 | Fill #3

## 2016-11-04 NOTE — Progress Notes (Signed)
Follow-up Visit   Date: 11/05/16    Susan Horton MRN: [id number] DOB: 10-22-1970   Interim History: Susan Horton is a 46 y.o. right-handed African American female with poorly controlled insulin-dependent diabetes mellitus (PFX9K 24.0) complicated by CKD, neuropathy, and gastroparesis, hypertension, and GERD returning for evaluation of diabetic neuropathy. The patient was accompanied to the clinic by self.  History of present illness: Patient was diagnosed with diabetes at the age of 48 while pregnant with her daughter and has been on insulin since 1999.  Unfortunately, her diabetes has never been under good control and she suffered for complications from this including neuropathy, CKD, retinopathy, and gastroparesis.  She reports having neuropathy for many years, but symptoms have got worse over the past two years.  She has numbness, burning and tingling pain of the feet and the mid-calf.  She has difficulty with balance and falls. She still walk unassisted. Last year, she reports unknowingly having a needle in the sole of her foot which was seen incidentally in x-ray of her foot. Since this time, she is very cautious about examining her feet daily. She currently takes gabapentin 600mg  BID and Cymbalta 30mg  daily.  About a year ago, she also started having numbness and tingling of the fingertips and occasionally in the forearms.  She was working as a Designer, industrial/product and has always been very independent, but because of her medical problems stopped working last year.    She has no history of alcohol use, family history of neuropathy, or vitamin B12 deficiency.   UPDATE 11/05/2016:  Since she was last here, she underwent cardiac catheretization and had PCI with DES to LAD. She does not have any new cardiac complaints. She also has medicare coverage now.  Unfortunately, she continues to have unrelenting neuropathic pain of the feet, lower legs, and hands.  Despite being on maximal dose  gabapentin and Cymbalta, she does not appreciate any benefit.  Her gabapentin was increased to 600mg  TID which only makes her sleepy and tired. She had tried OTC ointments and creams. She finds herself crying at times, because the pain can be so severe.  She has imbalance and has fallen twice.  When pain is severe, she uses a cane to walk.   Medications:  Current Outpatient Prescriptions on File Prior to Visit  Medication Sig Dispense Refill  . albuterol (PROVENTIL HFA;VENTOLIN HFA) 108 (90 BASE) MCG/ACT inhaler Inhale 1-2 puffs into the lungs every 6 (six) hours as needed for wheezing or shortness of breath.     [name] amLODipine (NORVASC) 10 MG tablet Take 1 tablet (10 mg total) by mouth daily. 90 tablet 3  . aspirin 81 MG chewable tablet Chew 1 tablet (81 mg total) by mouth daily. 30 tablet 0  . Calcifediol ER (RAYALDEE) 30 MCG CPCR Take 30 mcg by mouth daily.     . calcitRIOL (ROCALTROL) 0.25 MCG capsule Take 0.25 mcg by mouth daily.  6  . Capsaicin-Menthol-Methyl Sal (CAPSAICIN-METHYL SAL-MENTHOL) 0.025-1-12 % CREA Apply 1 application topically 4 (four) times daily as needed. For aches/pains, apply to skin. May need gloves. (Patient taking differently: Apply 1 application topically 4 (four) times daily as needed (for aches or pains (may need gloves)). ) 1 Tube 3  . clopidogrel (PLAVIX) 75 MG tablet Take 1 tablet (75 mg total) by mouth daily with breakfast. 90 tablet 2  . DULoxetine (CYMBALTA) 30 MG capsule Take 1 capsule (30 mg total) by mouth daily. (Patient taking differently: Take 30 mg  by mouth every evening. ) 30 capsule 3  . Ferrous Sulfate (IRON) 325 (65 Fe) MG TABS Take 325 mg by mouth daily. 30 each 3  . Fluticasone-Salmeterol (ADVAIR) 250-50 MCG/DOSE AEPB Inhale 1 puff into the lungs 2 (two) times daily. 60 each   . gabapentin (NEURONTIN) 300 MG capsule Take 2 capsules (600 mg total) by mouth 3 (three) times daily. 180 capsule 3  . glipiZIDE (GLUCOTROL) 10 MG tablet Take 1 tablet (10 mg  total) by mouth 2 (two) times daily before a meal. 60 tablet 3  . Insulin Glargine (TOUJEO SOLOSTAR) 300 UNIT/ML SOPN Inject 60 Units into the skin 2 (two) times daily. 9 pen 5  . insulin regular (HUMULIN R) 100 units/mL injection Inject 0.05-0.2 mLs (5-20 Units total) into the skin 3 (three) times daily before meals. Sliding scale 10 mL 3  . losartan (COZAAR) 100 MG tablet Take 1 tablet (100 mg total) by mouth daily. 90 tablet 3  . lovastatin (MEVACOR) 20 MG tablet Take 20 mg by mouth daily.    . metoCLOPramide (REGLAN) 5 MG tablet Take 1 tablet (5 mg total) by mouth 4 (four) times daily -  before meals and at bedtime. 90 tablet 3  . metoprolol succinate (TOPROL-XL) 100 MG 24 hr tablet Take 1 tablet (100 mg total) by mouth daily. Take with or immediately following a meal. 30 tablet 6  . metoprolol tartrate 75 MG TABS Take 75 mg by mouth 2 (two) times daily. 60 tablet 0  . mirtazapine (REMERON SOL-TAB) 15 MG disintegrating tablet TAKE 1 TABLET BY MOUTH AT BEDTIME. 30 tablet 0  . montelukast (SINGULAIR) 10 MG tablet Take 10 mg by mouth daily as needed (allergies).     . nitroGLYCERIN (NITROSTAT) 0.4 MG SL tablet Place 1 tablet (0.4 mg total) under the tongue every 5 (five) minutes as needed. 25 tablet 3  . ondansetron (ZOFRAN) 4 MG tablet Take 1 tablet (4 mg total) by mouth daily as needed for nausea or vomiting. 30 tablet 0  . pantoprazole (PROTONIX) 40 MG tablet Take 1 tablet (40 mg total) by mouth daily. 30 tablet 2  . senna-docusate (SENOKOT-S) 8.6-50 MG tablet Take 1 tablet by mouth 2 (two) times daily.    . [DISCONTINUED] ranitidine (ZANTAC) 75 MG/5ML syrup Take 10 mLs (150 mg total) by mouth 2 (two) times daily. (Patient not taking: Reported on 10/23/2014) 180 mL 0   No current facility-administered medications on file prior to visit.     Allergies:  Allergies  Allergen Reactions  . Penicillin G Anaphylaxis, Itching, Swelling and Rash  . Penicillins Anaphylaxis, Itching, Swelling and  Rash    Swelling of throat & whole mouth  Has patient had a PCN reaction causing immediate rash, facial/tongue/throat swelling, SOB or lightheadedness with hypotension: Yes Has patient had a PCN reaction causing severe rash involving mucus membranes or skin necrosis: No Has patient had a PCN reaction that required hospitalization: No Has patient had a PCN reaction occurring within the last 10 years: No If all of the above answers are "NO", then may proceed with Cephalosporin use.  [name] Lisinopril Cough    Review of Systems:  CONSTITUTIONAL: No fevers, chills, night sweats, or weight loss.  EYES: No visual changes or eye pain ENT: No hearing changes.  No history of nose bleeds.   RESPIRATORY: No cough, wheezing and shortness of breath.   CARDIOVASCULAR: Negative for chest pain, and palpitations.   GI: Negative for abdominal discomfort, blood in stools or black  stools.  No recent change in bowel habits.   GU:  No history of incontinence.   MUSCLOSKELETAL: No history of joint pain or swelling.  No myalgias.   SKIN: Negative for lesions, rash, and itching.   ENDOCRINE: Negative for cold or heat intolerance, polydipsia or goiter.   PSYCH:  + depression or anxiety symptoms.   NEURO: As Above.   Vital Signs:  BP 140/84   Pulse 78   Ht 5\' 5"  (1.651 m)   Wt 167 lb 2 oz (75.8 kg)   SpO2 98%   BMI 27.81 kg/m    General: Depressed appearing, comfortable  Neurological Exam: MENTAL STATUS including orientation to time, place, person, recent and remote memory, attention span and concentration, language, and fund of knowledge is normal.  Speech is not dysarthric.  CRANIAL NERVES:  Pupils equal round and reactive to light.  Normal conjugate, extra-ocular eye movements in all directions of gaze.  No ptosis.  Face is symmetric.  MOTOR:  Motor strength is 5/5 in all extremities, except right toe flexion is 2/5 and finger abductors and extensors are 4/5.   No pronator drift.  Tone is normal.     MSRs:  Reflexes are 2+/4 in the arms and absent in the legs.  SENSORY:  Vibration is reduced to 50% at the knees, 25% at the ankles, and absent at the great toe.  Temperature is reduced distal to mid-calf bilaterally.  Pin prick and temperature is diminished over the finger tips.  There is sway with Rhomberg testing.  COORDINATION/GAIT:  Gait is mildly antalgic, wide-based.   DATA:   Lab Results  Component Value Date   HGBA1C 11.7 09/08/2016   Lab Results  Component Value Date   [id number] 093 06/06/2014   Lab Results  Component Value Date   TSH 1.930 07/26/2016    IMPRESSION/PLAN: 1.  Diabetic neuropathy in the setting of poorly controlled insulin dependent diabetes manfiesting with stocking-glove distribution of paresthesias, distal weakness, and sensory ataxia.  Unfortunately, she has no pain relief with gabapentin 600mg  TID or Cymbalta 30mg , which she takes for mood.  She is on maximal dose of Cymbalta based on her renal function.   - Due to cognitive side effects and lack of efficacy recommend tapering gabapentin to 600mg  BID x 1 week, then 300mg  BID.  If pain does not get worse, okay to discontinue.   - QTc reviewed and is 435, start nortriptyline 10mg  at bedtime and titrate to 20mg  qhs, if okay from cardiac standpoint  - Check vitamin B12, vitamin B1, copper, ANA, folate  - NCS/EMG of the right arm and leg  - Start alpha lipoic acid 600mg  daily  2.  Poorly controlled diabetes complicated by CKD (Cr. 2.67, GFR 18), neuropathy, and gastroparesis. Recommend that she follow-up with Dr. Cruzita Lederer now that she has insurance.  Patient to call with update in 1 month  Return to clinic in 3 months   Greater than 50% of this 30 minute visit was spent in counseling, explanation of diagnosis, planning of further management, and coordination of care.   Thank you for allowing me to participate in patient's care.  If I can answer any additional questions, I would be pleased to do so.     Sincerely,    Latif Nazareno K. Posey Pronto, DO

## 2016-11-05 ENCOUNTER — Other Ambulatory Visit (INDEPENDENT_AMBULATORY_CARE_PROVIDER_SITE_OTHER): Payer: Medicaid Other

## 2016-11-05 ENCOUNTER — Ambulatory Visit
Admission: RE | Admit: 2016-11-05 | Discharge: 2016-11-05 | Disposition: A | Discharge status: Home / Self Care | Payer: Medicaid Other | Source: Ambulatory Visit | Attending: Internal Medicine | Admitting: Internal Medicine

## 2016-11-05 ENCOUNTER — Encounter: Payer: Self-pay | Admitting: Neurology

## 2016-11-05 ENCOUNTER — Ambulatory Visit (INDEPENDENT_AMBULATORY_CARE_PROVIDER_SITE_OTHER): Payer: Medicaid Other | Admitting: Neurology

## 2016-11-05 VITALS — BP 140/84 | HR 78 | Ht 65.0 in | Wt 167.1 lb

## 2016-11-05 DIAGNOSIS — E1140 Type 2 diabetes mellitus with diabetic neuropathy, unspecified: Secondary | ICD-10-CM

## 2016-11-05 DIAGNOSIS — R928 Other abnormal and inconclusive findings on diagnostic imaging of breast: Secondary | ICD-10-CM

## 2016-11-05 LAB — FOLATE: Folate: 7.5 ng/mL (ref >5.9)

## 2016-11-05 LAB — VITAMIN B12: Vitamin B-12: 628 pg/mL (ref 211–911)

## 2016-11-05 MED FILL — METOPROLOL SUCC ER 100 MG T: 100 | 30 days supply | Qty: 30 | Fill #0

## 2016-11-05 NOTE — Patient Instructions (Addendum)
Reduce gabapentin to 600mg  twice daily x 1 week, then reduce to 300mg  twice daily, then stop.   If you develop worsening pain, go back to taking the dose that controlled your pain  I will l call and let you know whether it is okay to start nortriptyline 10mg  at bedtime  Check labs  NCS/EMG of the right arm and leg  Start alpha lipoic acid 600mg  daily  Please call the office with an update in 1 month.  Return to clinic in 3 months

## 2016-11-08 MED ORDER — NORTRIPTYLINE HCL 10 MG PO CAPS: ORAL_CAPSULE | ORAL | 5 refills | Status: DC

## 2016-11-08 NOTE — Progress Notes (Signed)
Addendum  Ok to start nortriptyline 10mg  at bedtime for 2 week, then increase to 2 tablet at bedtime.  Breyanna Valera K. Posey Pronto, DO

## 2016-11-08 NOTE — Addendum Note (Signed)
Addended by: Alda Berthold on: 11/08/2016 08:33 AM   Modules accepted: Orders

## 2016-11-08 NOTE — Progress Notes (Signed)
Patient notified

## 2016-11-11 ENCOUNTER — Telehealth: Payer: Self-pay | Admitting: *Deleted

## 2016-11-11 LAB — COPPER, SERUM: Copper: 154 ug/dL (ref 70–175)

## 2016-11-11 LAB — ANA: Anti Nuclear Antibody(ANA): NEGATIVE

## 2016-11-11 LAB — VITAMIN B1: Vitamin B1 (Thiamine): <6 nmol/L — ABNORMAL LOW (ref 8–30)

## 2016-11-11 NOTE — Telephone Encounter (Signed)
Patient given results and instructions.   

## 2016-11-11 NOTE — Telephone Encounter (Signed)
-----   Message from Alda Berthold, DO sent at 11/11/2016 11:09 AM EDT ----- Please inform patient that her thiamine levels are very low and to start vitamin B1 100mg  daily.  If she is drinking alcohol, recommend stopping as this will reduce thiamine levels and make neuropathy worse. Remaining labs are normal.

## 2016-11-11 NOTE — Telephone Encounter (Signed)
Left message for patient to call me back. 

## 2016-11-12 MED FILL — NORTRIPTYLINE HCL 10 MG CAP: 10 | 30 days supply | Qty: 60 | Fill #0

## 2016-11-18 ENCOUNTER — Encounter: Payer: Self-pay | Admitting: *Deleted

## 2016-11-18 NOTE — Progress Notes (Signed)
Patient MAMMOGRAM approved [id number] effective 11/01/16-12/01/16

## 2016-11-22 ENCOUNTER — Encounter: Payer: Self-pay | Admitting: Internal Medicine

## 2016-11-22 ENCOUNTER — Ambulatory Visit (INDEPENDENT_AMBULATORY_CARE_PROVIDER_SITE_OTHER): Payer: Medicaid Other | Admitting: Internal Medicine

## 2016-11-22 DIAGNOSIS — G629 Polyneuropathy, unspecified: Secondary | ICD-10-CM | POA: Diagnosis not present

## 2016-11-22 DIAGNOSIS — E1165 Type 2 diabetes mellitus with hyperglycemia: Secondary | ICD-10-CM

## 2016-11-22 DIAGNOSIS — IMO0002 Reserved for concepts with insufficient information to code with codable children: Secondary | ICD-10-CM

## 2016-11-22 DIAGNOSIS — Z794 Long term (current) use of insulin: Secondary | ICD-10-CM

## 2016-11-22 DIAGNOSIS — E1142 Type 2 diabetes mellitus with diabetic polyneuropathy: Secondary | ICD-10-CM | POA: Diagnosis not present

## 2016-11-22 DIAGNOSIS — E1122 Type 2 diabetes mellitus with diabetic chronic kidney disease: Secondary | ICD-10-CM | POA: Diagnosis not present

## 2016-11-22 DIAGNOSIS — N184 Chronic kidney disease, stage 4 (severe): Secondary | ICD-10-CM

## 2016-11-22 LAB — POCT GLYCOSYLATED HEMOGLOBIN (HGB A1C): HEMOGLOBIN A1C: 11.1

## 2016-11-22 MED ORDER — INSULIN GLARGINE 300 UNIT/ML ~~LOC~~ SOPN: 60.0000 [IU] | PEN_INJECTOR | Freq: Two times a day (BID) | SUBCUTANEOUS | 5 refills | Status: DC

## 2016-11-22 MED ORDER — INSULIN REGULAR HUMAN 100 UNIT/ML IJ SOLN: INTRAMUSCULAR | 5 refills | Status: DC

## 2016-11-22 NOTE — Addendum Note (Signed)
Addended by: Johna Sheriff on: 11/22/2016 08:58 AM   Modules accepted: Orders

## 2016-11-22 NOTE — Patient Instructions (Addendum)
Please continue: - Toujeo 60 units 2x a day  Please increase R insulin: - 15 units before a smaller meal - 20 units before a regular meal - 25 units before a larger meal or if you have dessert  Please return in 3 months with your sugar log.

## 2016-11-22 NOTE — Progress Notes (Signed)
Subjective:     Patient ID: [name], female   DOB: [birth year], 46 y.o.   MRN: [id number]  HPI Ms Saltzman is a 46 y.o. woman returning for f/u for DM (unclear if 1 or 2), dx in 1996 when pregnant with her daughter, insulin-dependent since 1999, uncontrolled, with complications (peripheral neuropathy, gastroparesis, DR, CKD).  Now [place].  Last OV 7 mo ago. She is not compliant with visits.  She has severe gastroparesis, will need a pacemaker. She has frequent N/V.  Last HbA1c: Lab Results  Component Value Date   HGBA1C 11.7 09/08/2016   HGBA1C 14.5 05/26/2016   HGBA1C >15.0 03/10/2016  07/24/2015: HbA1c 16%!  She was on:  Insulin Before breakfast Before lunch Before dinner  Regular 20 20  25   NPH 40  30   + R insulin sliding scale as follows:  - 150- 165: + 1 unit  - 166- 180: + 2 units  - 181- 195: + 3 units  - 196- 210: + 4 units  - 211- 225: + 5 units  - 226- 240: + 6 units  - 241- 255: + 7 units  - 256- 270: + 8 units  - 271- 285: + 9 units  - > 286: + 10 units  She is now on: - Toujeo 50 >> 60 units 2x a day - cannot remember what dose taking!!!! - R insulin 40 units 3x a day after a meal >> 5-20 units ??? - cannot remember what dose taking!!!!   - + Glipizide 10 mg 2x a day - started by PCP??? She checks her sugars 3x - am:  250 >> 353-421 >> 280-421 >> 200-300s - mid-morning: 90 (if skips b'fast) - 134 >> n/c - before lunch:  350s >> 541, HI  >> 230-469 >> 200-300s - before dinner: 250 >> 462 >> 368-HI >> 200-300s - before bedtime: 400-HI >> 254 to HI >> 383-591 >> 300s  - + CKD. Sees nephrology ([facility]). Last BUN/creatinine was: Lab Results  Component Value Date   BUN 48 (H) 08/30/2016   Lab Results  Component Value Date   CREATININE 3.27 (H) 08/30/2016  07/24/2015: 45/2.27, GFR 28 On Losartan  - + HL; Latest cholesterol level:  07/24/2015: 251/733/25/117 Lab Results  Component Value Date   CHOL 239 (H) 01/01/2010   HDL 33 (L) 01/01/2010   LDLCALC  176 (H) 01/01/2010   TRIG 149 01/01/2010   CHOLHDL 7.2 Ratio 01/01/2010  On Lovastatin now. - last eye exem: 01/2016 >> + DR. + macular edema. Had cataracts sx. Gets IO inj's. - she has tingling/numbness in feet >> sees podiatry  She had multiple visits to the ED and admissions For hyperglycemia, gastroparesis exacerbation.  She had a cardiac stent 07/2016.  Review of Systems Constitutional: no weight gain/+ weight loss + fatigue, no subjective hyperthermia, no subjective hypothermia, + nocturia Eyes: no blurry vision, no xerophthalmia ENT: no sore throat, no nodules palpated in throat, no dysphagia, no odynophagia, no hoarseness Cardiovascular: no CP/no SOB/no palpitations/no leg swelling Respiratory: no cough/no SOB/no wheezing Gastrointestinal: + N/+ V/no D/no C/+ acid reflux Musculoskeletal: no muscle aches/no joint aches Skin: no rashes, no hair loss Neurological: no tremors/no numbness/no tingling/no dizziness  I reviewed pt's medications, allergies, PMH, social hx, family hx, and changes were documented in the history of present illness. Otherwise, unchanged from my initial visit note.  Objective:   Physical Exam BP (!) 160/92 (BP Location: Left Arm, Patient Position: Sitting, Cuff Size: Normal)  Pulse (!) 109   Temp 98.1 F (36.7 C) (Oral)   Wt 167 lb 2 oz (75.8 kg)   SpO2 98%   BMI 27.81 kg/m  Body mass index is 27.81 kg/m. Wt Readings from Last 3 Encounters:  11/22/16 167 lb 2 oz (75.8 kg)  11/05/16 167 lb 2 oz (75.8 kg)  10/28/16 170 lb (77.1 kg)   Constitutional: overweight, in NAD Eyes: PERRLA, EOMI, no exophthalmos ENT: moist mucous membranes, no thyromegaly, no cervical lymphadenopathy Cardiovascular: tachycardia, RR, No MRG Respiratory: CTA B Gastrointestinal: abdomen soft, NT, ND, BS+ Musculoskeletal: no deformities, strength intact in all 4 Skin: moist, warm, no rashes Neurological: no tremor with outstretched hands, DTR normal in all 4     Assessment:     1. DM - unclear If type I or type II, insulin-dependent, uncontrolled, with complications - CKD - peripheral neuropathy - DR - gastroparesis 10/06/2005: GES: Clinical Data: 46 year-old, gastroparesis.  NUCLEAR MEDICINE GASTRIC EMPTYING SCAN:  Technique: After oral ingestion of radiolabeled meal, sequential abdominal images were obtained for 120 minutes. Residual percentage of activity remaining within the stomach was calculated at 60 and 120 minutes.  Radiopharmaceutical: 2 mCi Tc-11m sulfur colloid in egg meal.  Findings: Anterior imaging was performed over the stomach over a 2-hour period. At 60 minutes, close to 100% of the activity remains in the stomach, although there is some activity in the small bowel. At 2 hours, 91% of the activity remained in the stomach.  IMPRESSION:  Marked delayed solid phase gastric emptying consistent with gastroparesis.  Patient has been diagnosed with diabetes when she was 77, during her pregnancy, but it is unclear whether this is type I or type II. She has not have episodes of DKA, which would point more towards type 2 diabetes. I would like to check her C-peptide, however her sugars, and the C-peptide measurement is only valid if the glucose is lower than 200 at the time of the check.  Component     Latest Ref Rng 06/06/2014  Glutamic Acid Decarb Ab     0.0 - 5.0 U/mL <5.0  Pancreatic Islet Cell Antibody     Neg:<1:1 Negative   2. Diabetic peripheral neuropathy  Plan:     1. DM - Pt with poor DM control due to lack of bolusing/checking sugars and also due to her uncontrolled gastroparesis.  She again returns after a long absence.  She is not compliant with scheduled follow-ups.  Her HbA1c has decreased, but it is still extremely high, last being on 09/08/2016: 11.7%.  At last visit, we increased her basal insulin and changed to a more concentrated formulation, Toujeo.  We also moved her regular insulin after, rather than  before meals due to her gastroparesis.  We also discussed about the importance of compliance. - At this visit, she is not sure about how much insulin she is taking, and is difficult for me to understand exactly how much she is doing especially since the doses of her medications have been changed since last visit.  She apparently is now on 60 units of Toujeo twice a day (increased), and only 5-20 units of R insulin 3 times a day (decreased) and also on glipizide (started).  These are not the recommendations that they gave last time and I explained that if she takes more Toujeo and less R insulin per day the regimen will not be adequate.  Therefore, we will need to increase her R insulin and I will also take her  off glipizide since I do not feel that this is helping for now.  I also underlined the importance of her knowing exactly how much insulin she takes. - We cannot add any other medicines due to her GI problems.  Therefore, our only option is to work with her insulin doses. - I advised her to: Patient Instructions  Please continue: - Toujeo 60 units 2x a day  Please increase R insulin: - 15 units before a smaller meal - 20 units before a regular meal - 25 units before a larger meal or if you have dessert  Please return in 3 months with your sugar log.   - She was on an insulin pump which she liked and her diabetes was better controlled at that time, but, when she lost her insurance.  I did give her brochures in the past about insulin pumps but she did not decide about this.  Cost is also definitely an issue.  I also would like to see better compliance with her visits before we actually start a pump. - today, HbA1c is 11.1% (still very high) - continue checking sugars at different times of the day - check 3x a day, rotating checks - advised for yearly eye exams >> she is UTD - Return to clinic in 3 mo with sugar log   2. Diabetic peripheral neuropathy - at last visit, I recommended alpha  lipoic acid and b complex >> tried  - did not work - alpha-lipoic acid 600 mg 2x a day - B complex 1 tablet once a day Now on Neurontin. Not working [employer] use Lyrica 2/2 CKD. On Cymbalta. Sees neurology. - We absolutely need to start getting her diabetes under control  before her neuropathy becomes more manageable  Philemon Kingdom, MD PhD [facility] Endocrinology

## 2016-11-23 ENCOUNTER — Telehealth (INDEPENDENT_AMBULATORY_CARE_PROVIDER_SITE_OTHER): Payer: Self-pay | Admitting: *Deleted

## 2016-11-23 ENCOUNTER — Ambulatory Visit (INDEPENDENT_AMBULATORY_CARE_PROVIDER_SITE_OTHER): Payer: Medicaid Other | Admitting: Neurology

## 2016-11-23 DIAGNOSIS — E1140 Type 2 diabetes mellitus with diabetic neuropathy, unspecified: Secondary | ICD-10-CM | POA: Diagnosis not present

## 2016-11-23 NOTE — Telephone Encounter (Signed)
-----   Message from [name], MD sent at 11/05/2016  3:41 PM EDT ----- Please inform patient that her screening mammogram shows no evidence of malignancy. Recommend screening mammogram in one year

## 2016-11-23 NOTE — Procedures (Signed)
[facility]  [place]  [place], [place] [address] Tel: [phone] Fax:  [phone] Test Date:  11/23/2016  Patient: Susan Horton DOB: Mar 07, 1970 Physician: Narda Amber, DO  Sex: Female Height: 5\' 5"  Ref Phys: Narda Amber, DO  ID#: [id number] Temp: 34.5C Technician:    Patient Complaints: This is a 46 year old female with poorly controlled diabetes complicated by neuropathy, referred for evaluation of progressive neuropathic pain and distal weakness.  NCV & EMG Findings: Extensive electrodiagnostic testing of the right upper and lower extremity shows:  1. Right median sensory response shows prolonged distal peak latency (4.0 ms) and reduced amplitude (7.2 V).  The right radial sensory nerve showed reduced amplitude (17.0 V).  Right ulnar, sural, and superficial peroneal sensory responses are absent. 2. Right ulnar motor response shows prolonged distal onset latency (3.4 ms), reduced amplitude (4.6 mV), and decreased conduction velocity (A Elbow-B Elbow, 30 m/s). Right median motor response shows mild conduction velocity slowing in the forearm. 3. Right tibial and peroneal (EDB) motor responses are absent. Right peroneal motor response at the tibialis anterior is within normal limits. 4. Right tibial H reflex studies are absent. 5. Chronic motor axon loss changes are seen affecting the muscles below the knee and intrinsic muscles of the hand. There is no evidence of accompanied active denervation.   Impression: The electrophysiologic findings are consistent with a chronic, length-dependent sensorimotor polyneuropathy, axon loss and demyelinating in type, affecting the right arm and leg.     ___________________________ Narda Amber, DO    Nerve Conduction Studies Anti Sensory Summary Table   Site NR Peak (ms) Norm Peak (ms) P-T Amp (V) Norm P-T Amp  Right Median Anti Sensory (2nd Digit)  Wrist    4.0 <3.4 7.2 >20  Right Radial Anti Sensory (Base  1st Digit)  Wrist    2.7 <2.7 17.0 >18  Right Sup Peroneal Anti Sensory (Ant Lat Mall)  12 cm NR  <4.5  >5  Right Sural Anti Sensory (Lat Mall)  Calf NR  <4.5  >5  Right Ulnar Anti Sensory (5th Digit)  Wrist NR  <3.1  >12   Motor Summary Table   Site NR Onset (ms) Norm Onset (ms) O-P Amp (mV) Norm O-P Amp Site1 Site2 Delta-0 (ms) Dist (cm) Vel (m/s) Norm Vel (m/s)  Right Median Motor (Abd Poll Brev)  Wrist    3.8 <3.9 8.9 >6 Elbow Wrist 5.9 28.0 47 >50  Elbow    9.7  8.0         Right Peroneal Motor (Ext Dig Brev)  Ankle NR  <5.5  >3 B Fib Ankle  0.0  >40  B Fib NR     Poplt B Fib  0.0  >40  Poplt NR            Right Peroneal TA Motor (Tib Ant)  Fib Head    3.9 <4.0 5.4 >4 Poplit Fib Head 1.4 8.0 57 >40  Poplit    5.3  5.4         Right Tibial Motor (Abd Hall Brev)  Ankle NR  <6.0  >8 Knee Ankle  0.0  >40  Knee NR            Right Ulnar Motor (Abd Dig Minimi)  Wrist    3.4 <3.1 4.6 >7 B Elbow Wrist 4.6 23.0 50 >50  B Elbow    8.0  4.3  A Elbow B Elbow 3.3 10.0 30 >50  A Elbow  11.3  3.4          H Reflex Studies   NR H-Lat (ms) Lat Norm (ms) L-R H-Lat (ms)  Right Tibial (Gastroc)  NR  <35    EMG   Side Muscle Ins Act Fibs Psw Fasc Number Recrt Dur Dur. Amp Amp. Poly Poly. Comment  Right 1stDorInt Nml Nml Nml Nml 1- Rapid Some 1+ Some 1+ Some Nml N/A  Right ABD Dig Min Nml Nml Nml Nml 2- Rapid Many 1+ Many 1+ Many 1+ N/A  Right Abd Poll Brev Nml Nml Nml Nml 1- Rapid Few 1+ Few 1+ Nml Nml N/A  Right Ext Indicis Nml Nml Nml Nml Nml Nml Nml Nml Nml Nml Nml Nml N/A  Right PronatorTeres Nml Nml Nml Nml Nml Nml Nml Nml Nml Nml Nml Nml N/A  Right Biceps Nml Nml Nml Nml Nml Nml Nml Nml Nml Nml Nml Nml N/A  Right Triceps Nml Nml Nml Nml Nml Nml Nml Nml Nml Nml Nml Nml N/A  Right Deltoid Nml Nml Nml Nml Nml Nml Nml Nml Nml Nml Nml Nml N/A  Right AntTibialis Nml Nml Nml Nml 1- Rapid Some 1+ Some 1+ Nml Nml N/A  Right Gastroc Nml Nml Nml Nml 1- Rapid Some 1+ Some 1+ Nml Nml N/A    Right Flex Dig Long Nml Nml Nml Nml 2- Rapid Some 1+ Some 1+ Some 1+ N/A  Right RectFemoris Nml Nml Nml Nml Nml Nml Nml Nml Nml Nml Nml Nml N/A  Right GluteusMed Nml Nml Nml Nml Nml Nml Nml Nml Nml Nml Nml Nml N/A  Right BicepsFemS Nml Nml Nml Nml Nml Nml Nml Nml Nml Nml Nml Nml N/A      Waveforms:

## 2016-11-23 NOTE — Telephone Encounter (Signed)
Patient verified DOB Patient is aware of mammogram showing no evidence of malignancy and a recommended screening be completed in one year. No further questions.

## 2016-11-25 ENCOUNTER — Telehealth: Payer: Self-pay | Admitting: *Deleted

## 2016-11-25 NOTE — Telephone Encounter (Signed)
Patient given results and instructions.   

## 2016-11-25 NOTE — Telephone Encounter (Signed)
-----   Message from Alda Berthold, DO sent at 11/24/2016  5:38 PM EST ----- Please inform patient that her nerve testing shows severe neuropathy affecting the arm and legs due to diabetes.  There is no CTS or nerve impingement from the neck or back.  Important to keep sugars under control.

## 2016-12-08 ENCOUNTER — Encounter: Payer: Self-pay | Admitting: Internal Medicine

## 2016-12-08 ENCOUNTER — Other Ambulatory Visit: Payer: Self-pay | Admitting: Internal Medicine

## 2016-12-08 ENCOUNTER — Ambulatory Visit: Payer: Medicaid Other | Attending: Internal Medicine | Admitting: Internal Medicine

## 2016-12-08 VITALS — BP 172/80 | HR 107 | Temp 98.1°F | Resp 18 | Ht 65.0 in | Wt 168.0 lb

## 2016-12-08 DIAGNOSIS — E1122 Type 2 diabetes mellitus with diabetic chronic kidney disease: Secondary | ICD-10-CM | POA: Diagnosis not present

## 2016-12-08 DIAGNOSIS — IMO0002 Reserved for concepts with insufficient information to code with codable children: Secondary | ICD-10-CM

## 2016-12-08 DIAGNOSIS — E1143 Type 2 diabetes mellitus with diabetic autonomic (poly)neuropathy: Secondary | ICD-10-CM | POA: Insufficient documentation

## 2016-12-08 DIAGNOSIS — Z79899 Other long term (current) drug therapy: Secondary | ICD-10-CM | POA: Insufficient documentation

## 2016-12-08 DIAGNOSIS — E1165 Type 2 diabetes mellitus with hyperglycemia: Secondary | ICD-10-CM | POA: Diagnosis present

## 2016-12-08 DIAGNOSIS — K219 Gastro-esophageal reflux disease without esophagitis: Secondary | ICD-10-CM | POA: Diagnosis not present

## 2016-12-08 DIAGNOSIS — F4321 Adjustment disorder with depressed mood: Secondary | ICD-10-CM

## 2016-12-08 DIAGNOSIS — E1136 Type 2 diabetes mellitus with diabetic cataract: Secondary | ICD-10-CM | POA: Insufficient documentation

## 2016-12-08 DIAGNOSIS — E1142 Type 2 diabetes mellitus with diabetic polyneuropathy: Secondary | ICD-10-CM | POA: Diagnosis not present

## 2016-12-08 DIAGNOSIS — J45909 Unspecified asthma, uncomplicated: Secondary | ICD-10-CM | POA: Diagnosis not present

## 2016-12-08 DIAGNOSIS — Z7902 Long term (current) use of antithrombotics/antiplatelets: Secondary | ICD-10-CM | POA: Diagnosis not present

## 2016-12-08 DIAGNOSIS — B3731 Acute candidiasis of vulva and vagina: Secondary | ICD-10-CM

## 2016-12-08 DIAGNOSIS — Z794 Long term (current) use of insulin: Secondary | ICD-10-CM | POA: Diagnosis not present

## 2016-12-08 DIAGNOSIS — B373 Candidiasis of vulva and vagina: Secondary | ICD-10-CM | POA: Diagnosis not present

## 2016-12-08 DIAGNOSIS — Z880 Allergy status to penicillin: Secondary | ICD-10-CM | POA: Diagnosis not present

## 2016-12-08 DIAGNOSIS — I2510 Atherosclerotic heart disease of native coronary artery without angina pectoris: Secondary | ICD-10-CM | POA: Insufficient documentation

## 2016-12-08 DIAGNOSIS — I10 Essential (primary) hypertension: Secondary | ICD-10-CM | POA: Diagnosis not present

## 2016-12-08 DIAGNOSIS — I129 Hypertensive chronic kidney disease with stage 1 through stage 4 chronic kidney disease, or unspecified chronic kidney disease: Secondary | ICD-10-CM | POA: Diagnosis not present

## 2016-12-08 DIAGNOSIS — G629 Polyneuropathy, unspecified: Secondary | ICD-10-CM | POA: Diagnosis not present

## 2016-12-08 DIAGNOSIS — Z7982 Long term (current) use of aspirin: Secondary | ICD-10-CM | POA: Diagnosis not present

## 2016-12-08 DIAGNOSIS — N184 Chronic kidney disease, stage 4 (severe): Secondary | ICD-10-CM | POA: Insufficient documentation

## 2016-12-08 LAB — GLUCOSE, POCT (MANUAL RESULT ENTRY): POC Glucose: 442 mg/dl — AB (ref 70–99)

## 2016-12-08 MED ORDER — DULOXETINE HCL 60 MG PO CPEP: 60.0000 mg | ORAL_CAPSULE | Freq: Every day | ORAL | 3 refills | Status: DC

## 2016-12-08 MED ORDER — FLUCONAZOLE 150 MG PO TABS: 150.0000 mg | ORAL_TABLET | Freq: Once | ORAL | 0 refills | Status: AC

## 2016-12-08 MED FILL — HumuLIN R 100 UNIT/ML SOLN: 100 | 26 days supply | Qty: 20 | Fill #0

## 2016-12-08 MED FILL — PANTOPRAZOLE SOD DR 40 MG T: 40 | 30 days supply | Qty: 30 | Fill #2

## 2016-12-08 MED FILL — NORTRIPTYLINE HCL 10 MG CAP: 10 | 30 days supply | Qty: 60 | Fill #1

## 2016-12-08 MED FILL — MIRTAZAPINE 15 MG ODT: 15 | 30 days supply | Qty: 30 | Fill #0

## 2016-12-08 MED FILL — DULoxetine HCL 60 MG CPEP: 60 | 30 days supply | Qty: 30 | Fill #0

## 2016-12-08 MED FILL — GABAPENTIN 300 MG CAPSULE: 300 | 30 days supply | Qty: 180 | Fill #0

## 2016-12-08 MED FILL — FLUCONAZOLE 150 MG TABLET: 150 | 1 days supply | Qty: 1 | Fill #0

## 2016-12-08 MED FILL — CALCITRIOL 0.25 MCG CAPS: 0.25 | 31 days supply | Qty: 31 | Fill #4

## 2016-12-08 MED FILL — AMLODIPINE BESYLATE 10 MG T: 10 | 30 days supply | Qty: 30 | Fill #4

## 2016-12-08 MED FILL — glipiZIDE 10 MG TABS: 10 | 30 days supply | Qty: 60 | Fill #3

## 2016-12-08 MED FILL — LOSARTAN POTASSIUM 100 MG T: 100 | 30 days supply | Qty: 30 | Fill #4

## 2016-12-08 NOTE — Patient Instructions (Signed)
Diabetes Mellitus and Food It is important for you to manage your blood sugar (glucose) level. Your blood glucose level can be greatly affected by what you eat. Eating healthier foods in the appropriate amounts throughout the day at about the same time each day will help you control your blood glucose level. It can also help slow or prevent worsening of your diabetes mellitus. Healthy eating may even help you improve the level of your blood pressure and reach or maintain a healthy weight. General recommendations for healthful eating and cooking habits include:  Eating meals and snacks regularly. Avoid going long periods of time without eating to lose weight.  Eating a diet that consists mainly of plant-based foods, such as fruits, vegetables, nuts, legumes, and whole grains.  Using low-heat cooking methods, such as baking, instead of high-heat cooking methods, such as deep frying.  Work with your dietitian to make sure you understand how to use the Nutrition Facts information on food labels. How can food affect me? Carbohydrates Carbohydrates affect your blood glucose level more than any other type of food. Your dietitian will help you determine how many carbohydrates to eat at each meal and teach you how to count carbohydrates. Counting carbohydrates is important to keep your blood glucose at a healthy level, especially if you are using insulin or taking certain medicines for diabetes mellitus. Alcohol Alcohol can cause sudden decreases in blood glucose (hypoglycemia), especially if you use insulin or take certain medicines for diabetes mellitus. Hypoglycemia can be a life-threatening condition. Symptoms of hypoglycemia (sleepiness, dizziness, and disorientation) are similar to symptoms of having too much alcohol. If your health care provider has given you approval to drink alcohol, do so in moderation and use the following guidelines:  Women should not have more than one drink per day, and men  should not have more than two drinks per day. One drink is equal to: ? 12 oz of beer. ? 5 oz of wine. ? 1 oz of hard liquor.  Do not drink on an empty stomach.  Keep yourself hydrated. Have water, diet soda, or unsweetened iced tea.  Regular soda, juice, and other mixers might contain a lot of carbohydrates and should be counted.  What foods are not recommended? As you make food choices, it is important to remember that all foods are not the same. Some foods have fewer nutrients per serving than other foods, even though they might have the same number of calories or carbohydrates. It is difficult to get your body what it needs when you eat foods with fewer nutrients. Examples of foods that you should avoid that are high in calories and carbohydrates but low in nutrients include:  Trans fats (most processed foods list trans fats on the Nutrition Facts label).  Regular soda.  Juice.  Candy.  Sweets, such as cake, pie, doughnuts, and cookies.  Fried foods.  What foods can I eat? Eat nutrient-rich foods, which will nourish your body and keep you healthy. The food you should eat also will depend on several factors, including:  The calories you need.  The medicines you take.  Your weight.  Your blood glucose level.  Your blood pressure level.  Your cholesterol level.  You should eat a variety of foods, including:  Protein. ? Lean cuts of meat. ? Proteins low in saturated fats, such as fish, egg whites, and beans. Avoid processed meats.  Fruits and vegetables. ? Fruits and vegetables that may help control blood glucose levels, such as apples,   mangoes, and yams.  Dairy products. ? Choose fat-free or low-fat dairy products, such as milk, yogurt, and cheese.  Grains, bread, pasta, and rice. ? Choose whole grain products, such as multigrain bread, whole oats, and brown rice. These foods may help control blood pressure.  Fats. ? Foods containing healthful fats, such as  nuts, avocado, olive oil, canola oil, and fish.  Does everyone with diabetes mellitus have the same meal plan? Because every person with diabetes mellitus is different, there is not one meal plan that works for everyone. It is very important that you meet with a dietitian who will help you create a meal plan that is just right for you. This information is not intended to replace advice given to you by your health care provider. Make sure you discuss any questions you have with your health care provider. Document Released: 09/24/2004 Document Revised: 06/05/2015 Document Reviewed: 11/24/2012 Elsevier Interactive Patient Education  2017 [employer]. Diabetes and Foot Care Diabetes may cause you to have problems because of poor blood supply (circulation) to your feet and legs. This may cause the skin on your feet to become thinner, break easier, and heal more slowly. Your skin may become dry, and the skin may peel and crack. You may also have nerve damage in your legs and feet causing decreased feeling in them. You may not notice minor injuries to your feet that could lead to infections or more serious problems. Taking care of your feet is one of the most important things you can do for yourself. Follow these instructions at home:  Wear shoes at all times, even in the house. Do not go barefoot. Bare feet are easily injured.  Check your feet daily for blisters, cuts, and redness. If you cannot see the bottom of your feet, use a mirror or ask someone for help.  Wash your feet with warm water (do not use hot water) and mild soap. Then pat your feet and the areas between your toes until they are completely dry. Do not soak your feet as this can dry your skin.  Apply a moisturizing lotion or petroleum jelly (that does not contain alcohol and is unscented) to the skin on your feet and to dry, brittle toenails. Do not apply lotion between your toes.  Trim your toenails straight across. Do not dig under  them or around the cuticle. File the edges of your nails with an emery board or nail file.  Do not cut corns or calluses or try to remove them with medicine.  Wear clean socks or stockings every day. Make sure they are not too tight. Do not wear knee-high stockings since they may decrease blood flow to your legs.  Wear shoes that fit properly and have enough cushioning. To break in new shoes, wear them for just a few hours a day. This prevents you from injuring your feet. Always look in your shoes before you put them on to be sure there are no objects inside.  Do not cross your legs. This may decrease the blood flow to your feet.  If you find a minor scrape, cut, or break in the skin on your feet, keep it and the skin around it clean and dry. These areas may be cleansed with mild soap and water. Do not cleanse the area with peroxide, alcohol, or iodine.  When you remove an adhesive bandage, be sure not to damage the skin around it.  If you have a wound, look at it several times  a day to make sure it is healing.  Do not use heating pads or hot water bottles. They may burn your skin. If you have lost feeling in your feet or legs, you may not know it is happening until it is too late.  Make sure your health care provider performs a complete foot exam at least annually or more often if you have foot problems. Report any cuts, sores, or bruises to your health care provider immediately. Contact a health care provider if:  You have an injury that is not healing.  You have cuts or breaks in the skin.  You have an ingrown nail.  You notice redness on your legs or feet.  You feel burning or tingling in your legs or feet.  You have pain or cramps in your legs and feet.  Your legs or feet are numb.  Your feet always feel cold. Get help right away if:  There is increasing redness, swelling, or pain in or around a wound.  There is a red line that goes up your leg.  Pus is coming from a  wound.  You develop a fever or as directed by your health care provider.  You notice a bad smell coming from an ulcer or wound. This information is not intended to replace advice given to you by your health care provider. Make sure you discuss any questions you have with your health care provider. Document Released: 12/26/1999 Document Revised: 06/05/2015 Document Reviewed: 06/06/2012 Elsevier Interactive Patient Education  2017 [employer].

## 2016-12-08 NOTE — Progress Notes (Signed)
.  h 

## 2016-12-08 NOTE — Progress Notes (Signed)
Susan Horton, is a 46 y.o. female  ZHG:[id number]  HDQ:[id number]  DOB - 11/26/1970  Chief Complaint  Patient presents with  . Diabetes  . Hypertension       Subjective:   Susan Horton is a 46 y.o. female with history of uncontrolled DM, HTN, nicotine use disorder,Asthma, and CKD stage 3 who presents  here today for a follow up visit and medication refill. Patient is complaining of on going generalized pain, 10/10, mostly neuropathic. She says "my feet are on fire all the time". She is currently on multiple medications, including Gabapentin 600 mg tid and Cymbalta 30 mg daily. She says "all these medications don't help". She said the only thing that helps is "when I eat some edibles from my son made in [place]." New complaint today: vaginal itching and irritation similar to her previous candidal infection. No urinary symptom. She claims adherence with her medications. She now has Medicaid, awaiting disability approval. She denies any suicidal ideations or thoughts. She follows up regularly with multiple specialists: Endocrinology, Cardiology, Nephrology, Neurology, Podiatry and Ophthalmology. Patient has No headache, No chest pain, No Cough - SOB.  Problem  Vulvovaginal Candidiasis    ALLERGIES: Allergies  Allergen Reactions  . Penicillin G Anaphylaxis, Itching, Swelling and Rash  . Penicillins Anaphylaxis, Itching, Swelling and Rash    Swelling of throat & whole mouth  Has patient had a PCN reaction causing immediate rash, facial/tongue/throat swelling, SOB or lightheadedness with hypotension: Yes Has patient had a PCN reaction causing severe rash involving mucus membranes or skin necrosis: No Has patient had a PCN reaction that required hospitalization: No Has patient had a PCN reaction occurring within the last 10 years: No If all of the above answers are "NO", then may proceed with Cephalosporin use.  [name] Lisinopril Cough    PAST MEDICAL HISTORY: Past Medical History:    Diagnosis Date  . Asthma   . CAD (coronary artery disease)    DES to mid LAD July 2018, residual moderate RCA disease  . Cataract   . CKD (chronic kidney disease) stage 4, GFR 15-29 ml/min (HCC)   . DVT (deep venous thrombosis) ([place])    20+ yrs ago (during pregnancy), 2015  . Essential hypertension 12/11/2015  . Gastroparesis   . GERD (gastroesophageal reflux disease)   . Hidradenitis   . Migraine   . Type 2 diabetes mellitus ([place])     MEDICATIONS AT HOME: Prior to Admission medications   Medication Sig Start Date End Date Taking? Authorizing Provider  albuterol (PROVENTIL HFA;VENTOLIN HFA) 108 (90 BASE) MCG/ACT inhaler Inhale 1-2 puffs into the lungs every 6 (six) hours as needed for wheezing or shortness of breath.     [provider]  amLODipine (NORVASC) 10 MG tablet Take 1 tablet (10 mg total) by mouth daily. 01/21/16   [name], MD  aspirin 81 MG chewable tablet Chew 1 tablet (81 mg total) by mouth daily. 08/07/16   Cheryln Manly, NP  Calcifediol ER (RAYALDEE) 30 MCG CPCR Take 30 mcg by mouth daily.     [provider]  calcitRIOL (ROCALTROL) 0.25 MCG capsule Take 0.25 mcg by mouth daily. 05/26/16   [provider]  Capsaicin-Menthol-Methyl Sal (CAPSAICIN-METHYL SAL-MENTHOL) 0.025-1-12 % CREA Apply 1 application topically 4 (four) times daily as needed. For aches/pains, apply to skin. May need gloves. Patient taking differently: Apply 1 application topically 4 (four) times daily as needed (for aches or pains (may need gloves)).  02/24/16   Langeland,  Leda Quail, MD  clopidogrel (PLAVIX) 75 MG tablet Take 1 tablet (75 mg total) by mouth daily with breakfast. 08/07/16   Reino Bellis B, NP  DULoxetine (CYMBALTA) 60 MG capsule Take 1 capsule (60 mg total) by mouth daily. 12/08/16   [name], MD  Ferrous Sulfate (IRON) 325 (65 Fe) MG TABS Take 325 mg by mouth daily. 11/25/15   Brayton Caves, PA-C  fluconazole (DIFLUCAN) 150 MG  tablet Take 1 tablet (150 mg total) by mouth once for 1 dose. 12/08/16 12/08/16  [name], MD  Fluticasone-Salmeterol (ADVAIR) 250-50 MCG/DOSE AEPB Inhale 1 puff into the lungs 2 (two) times daily. 08/30/16   Geradine Girt, DO  gabapentin (NEURONTIN) 300 MG capsule Take 2 capsules (600 mg total) by mouth 3 (three) times daily. 03/10/16   [name], MD  Insulin Glargine (TOUJEO SOLOSTAR) 300 UNIT/ML SOPN Inject 60 Units 2 (two) times daily into the skin. 11/22/16   Philemon Kingdom, MD  insulin regular (HUMULIN R) 100 units/mL injection Inject 15-25 into the skin 3 (three) times daily before meals. Sliding scale 11/22/16   Philemon Kingdom, MD  losartan (COZAAR) 100 MG tablet Take 1 tablet (100 mg total) by mouth daily. 01/21/16   [name], MD  lovastatin (MEVACOR) 20 MG tablet Take 20 mg by mouth daily.    [provider]  metoCLOPramide (REGLAN) 5 MG tablet Take 1 tablet (5 mg total) by mouth 4 (four) times daily -  before meals and at bedtime. 01/21/16   [name], MD  metoprolol succinate (TOPROL-XL) 100 MG 24 hr tablet Take 1 tablet (100 mg total) by mouth daily. Take with or immediately following a meal. 10/18/16   [name], MD  metoprolol tartrate 75 MG TABS Take 75 mg by mouth 2 (two) times daily. 08/30/16   Geradine Girt, DO  mirtazapine (REMERON SOL-TAB) 15 MG disintegrating tablet TAKE 1 TABLET BY MOUTH AT BEDTIME. 09/09/16   Emmanual Gauthreaux E, MD  montelukast (SINGULAIR) 10 MG tablet Take 10 mg by mouth daily as needed (allergies).     [provider]  nitroGLYCERIN (NITROSTAT) 0.4 MG SL tablet Place 1 tablet (0.4 mg total) under the tongue every 5 (five) minutes as needed. 08/06/16   Cheryln Manly, NP  nortriptyline (PAMELOR) 10 MG capsule Start 10mg  at bedtime for 2 week, then increase to 2 tablet at bedtime 11/08/16   Narda Amber K, DO  ondansetron (ZOFRAN) 4 MG tablet Take 1 tablet (4 mg total) by mouth  daily as needed for nausea or vomiting. 08/30/16 08/30/17  Geradine Girt, DO  pantoprazole (PROTONIX) 40 MG tablet Take 1 tablet (40 mg total) by mouth daily. 08/07/16   Cheryln Manly, NP  senna-docusate (SENOKOT-S) 8.6-50 MG tablet Take 1 tablet by mouth 2 (two) times daily. 08/30/16   Geradine Girt, DO    Objective:   Vitals:   12/08/16 1101  BP: (!) 172/80  Pulse: (!) 107  Resp: 18  Temp: 98.1 F (36.7 C)  TempSrc: Oral  SpO2: 100%  Weight: 168 lb (76.2 kg)  Height: 5\' 5"  (1.651 m)   Exam General appearance : Awake, alert, not in any distress. Speech Clear. Not toxic looking HEENT: Atraumatic and Normocephalic, pupils equally reactive to light and accomodation Neck: Supple, no JVD. No cervical lymphadenopathy.  Chest: Good air entry bilaterally, no added sounds  CVS: S1 S2 regular, no murmurs.  Abdomen: Bowel sounds present, Non tender and  not distended with no gaurding, rigidity or rebound. Extremities: B/L Lower Ext shows no edema, both legs are warm to touch Neurology: Awake alert, and oriented X 3, CN II-XII intact, Non focal Skin: No Rash  Data Review Lab Results  Component Value Date   HGBA1C 11.1 11/22/2016   HGBA1C 11.7 09/08/2016   HGBA1C 14.5 05/26/2016    Assessment & Plan   1. Uncontrolled type 2 diabetes mellitus with peripheral neuropathy (HCC) - HbA1C 2 weeks ago was 11.1% - Continue current regimen as prescribed by endocrinologist - Aim for 30 minutes of exercise most days.  Rethink what you drink. Water is great! Aim for 2-3 Carb Choices per meal (30-45 grams) +/- 1 either way  Aim for 0-15 Carbs per snack if hungry  Include protein in moderation with your meals and snacks  Consider reading food labels for Total Carbohydrate and Fat Grams of foods  Consider checking BG at alternate times per day  Continue taking medication as directed Be mindful about how much sugar you are adding to beverages and other foods. Fruit Punch - find one with  no sugar  Measure and decrease portions of carbohydrate foods  Make your plate and don't go back for seconds  - Glucose (CBG)  2. Neuropathy - Patient on Neurontin 600 mg 3x daily, will continue - Follow up with Neurologist  3. Essential hypertension: Uncontrolled. Patient has not taken her medication this morning We have discussed target BP range and blood pressure goal. I have advised patient to check BP regularly and to call us back or report to clinic if the numbers are consistently higher than 140/90. We discussed the importance of compliance with medical therapy and DASH diet recommended, consequences of uncontrolled hypertension discussed.  - continue current BP medications  4. Adjustment disorder with depressed mood Increase Cymbalta to 60 mg daily, will help with pain as well - DULoxetine (CYMBALTA) 60 MG capsule; Take 1 capsule (60 mg total) by mouth daily.  Dispense: 30 capsule; Refill: 3  5. Vulvovaginal candidiasis  - fluconazole (DIFLUCAN) 150 MG tablet; Take 1 tablet (150 mg total) by mouth once for 1 dose.  Dispense: 1 tablet; Refill: 0  Patient have been counseled extensively about nutrition and exercise. Other issues discussed during this visit include: low cholesterol diet, weight control and daily exercise, foot care, annual eye examinations at Ophthalmology, importance of adherence with medications and regular follow-up. We also discussed long term complications of uncontrolled diabetes and hypertension.   Return in about 3 months (around 03/10/2017) for Hemoglobin A1C and Follow up, DM, Follow up HTN, Follow up Pain and comorbidities.  The patient was given clear instructions to go to ER or return to medical center if symptoms don't improve, worsen or new problems develop. The patient verbalized understanding. The patient was told to call to get lab results if they haven't heard anything in the next week.   This note has been created with Administrator. Any transcriptional errors are unintentional.    Angelica Chessman, MD, [place], Karilyn Cota, [facility] [place], [place]   12/08/2016, 11:20 AM

## 2016-12-20 ENCOUNTER — Ambulatory Visit: Payer: Self-pay | Admitting: Neurology

## 2016-12-28 ENCOUNTER — Other Ambulatory Visit: Payer: Self-pay

## 2016-12-28 DIAGNOSIS — Z01810 Encounter for preprocedural cardiovascular examination: Secondary | ICD-10-CM

## 2016-12-28 DIAGNOSIS — N183 Chronic kidney disease, stage 3 unspecified: Secondary | ICD-10-CM

## 2017-01-03 ENCOUNTER — Other Ambulatory Visit (HOSPITAL_COMMUNITY): Payer: Medicaid Other

## 2017-01-03 ENCOUNTER — Encounter (HOSPITAL_COMMUNITY): Payer: Medicaid Other

## 2017-01-05 ENCOUNTER — Other Ambulatory Visit (HOSPITAL_COMMUNITY): Payer: Self-pay | Admitting: *Deleted

## 2017-01-05 ENCOUNTER — Encounter: Payer: Self-pay | Admitting: Neurology

## 2017-01-05 ENCOUNTER — Ambulatory Visit: Payer: Medicaid Other | Admitting: Neurology

## 2017-01-05 VITALS — BP 130/80 | HR 94 | Ht 65.0 in | Wt 172.0 lb

## 2017-01-05 DIAGNOSIS — M25512 Pain in left shoulder: Secondary | ICD-10-CM

## 2017-01-05 DIAGNOSIS — Z79899 Other long term (current) drug therapy: Secondary | ICD-10-CM

## 2017-01-05 DIAGNOSIS — E1342 Other specified diabetes mellitus with diabetic polyneuropathy: Secondary | ICD-10-CM

## 2017-01-05 MED ORDER — NORTRIPTYLINE HCL 10 MG PO CAPS: ORAL_CAPSULE | ORAL | 5 refills | Status: DC

## 2017-01-05 MED ORDER — CYCLOBENZAPRINE HCL 5 MG PO TABS: 5.0000 mg | ORAL_TABLET | Freq: Every evening | ORAL | 3 refills | Status: DC | PRN

## 2017-01-05 MED FILL — NORTRIPTYLINE HCL 10 MG CAP: 10 | 30 days supply | Qty: 120 | Fill #0

## 2017-01-05 MED FILL — CYCLOBENZAPRINE 5 MG TABLET: 5 | 30 days supply | Qty: 30 | Fill #0

## 2017-01-05 NOTE — Progress Notes (Signed)
Follow-up Visit   Date: 01/05/17    Susan Horton MRN: [id number] DOB: Jul 18, 1970   Interim History: Susan Horton is a 46 y.o. right-handed African American female with poorly controlled insulin-dependent diabetes mellitus (YKD9I 11) complicated by CKD, neuropathy, and gastroparesis, hypertension, and GERD returning for evaluation of diabetic neuropathy. The patient was accompanied to the clinic by self.  History of present illness: Patient was diagnosed with diabetes at the age of 16 while pregnant with her daughter and has been on insulin since 1999.  Unfortunately, her diabetes has never been under good control and she suffered for complications from this including neuropathy, CKD, retinopathy, and gastroparesis.  She reports having neuropathy for many years, but symptoms have got worse over the past two years.  She has numbness, burning and tingling pain of the feet and the mid-calf.  She has difficulty with balance and falls. She still walk unassisted. Last year, she reports unknowingly having a needle in the sole of her foot which was seen incidentally in x-ray of her foot. Since this time, she is very cautious about examining her feet daily. She currently takes gabapentin 600mg  BID and Cymbalta 30mg  daily.  About a year ago, she also started having numbness and tingling of the fingertips and occasionally in the forearms.  She was working as a Designer, industrial/product and has always been very independent, but because of her medical problems stopped working last year.    She has no history of alcohol use, family history of neuropathy, or vitamin B12 deficiency.   UPDATE 11/05/2016:  Since she was last here, she underwent cardiac catheretization and had PCI with DES to LAD. She does not have any new cardiac complaints. She also has medicare coverage now.  Unfortunately, she continues to have unrelenting neuropathic pain of the feet, lower legs, and hands.  Despite being on maximal dose  gabapentin and Cymbalta, she does not appreciate any benefit.  Her gabapentin was increased to 600mg  TID which only makes her sleepy and tired. She had tried OTC ointments and creams. She finds herself crying at times, because the pain can be so severe.  She has imbalance and has fallen twice.  When pain is severe, she uses a cane to walk.  UPDATE 01/05/2017:  She is here for 2 month follow-up visit.  She has been slowly reducing the dose of gabapentin due to cognitive side effects and now takes 300mg  at bedtime only, which has significantly helped her stay awake during the day.  Pain has not become worse while reducing the dose.  She is taking nortriptyline 20mg  at bedtime and Cymbalta 60mg  daily.  Unfortunately, she cannot appreciate any relief of her pain. She denies any new weakness or interval falls.   Today, she is also complaining of left shoulder achy pain, which is tender to palpation.  She has some reduced range of motion of the shoulder.  No relief with NSAIDs or tylenol.  She has not talked to her PCP about this.    Medications:  Current Outpatient Medications on File Prior to Visit  Medication Sig Dispense Refill  . albuterol (PROVENTIL HFA;VENTOLIN HFA) 108 (90 BASE) MCG/ACT inhaler Inhale 1-2 puffs into the lungs every 6 (six) hours as needed for wheezing or shortness of breath.     [name] amLODipine (NORVASC) 10 MG tablet Take 1 tablet (10 mg total) by mouth daily. 90 tablet 3  . aspirin 81 MG chewable tablet Chew 1 tablet (81 mg total) by mouth  daily. 30 tablet 0  . Calcifediol ER (RAYALDEE) 30 MCG CPCR Take 30 mcg by mouth daily.     . calcitRIOL (ROCALTROL) 0.25 MCG capsule Take 0.25 mcg by mouth daily.  6  . Capsaicin-Menthol-Methyl Sal (CAPSAICIN-METHYL SAL-MENTHOL) 0.025-1-12 % CREA Apply 1 application topically 4 (four) times daily as needed. For aches/pains, apply to skin. May need gloves. (Patient taking differently: Apply 1 application topically 4 (four) times daily as needed (for  aches or pains (may need gloves)). ) 1 Tube 3  . clopidogrel (PLAVIX) 75 MG tablet Take 1 tablet (75 mg total) by mouth daily with breakfast. 90 tablet 2  . DULoxetine (CYMBALTA) 60 MG capsule Take 1 capsule (60 mg total) by mouth daily. 30 capsule 3  . Ferrous Sulfate (IRON) 325 (65 Fe) MG TABS Take 325 mg by mouth daily. 30 each 3  . Fluticasone-Salmeterol (ADVAIR) 250-50 MCG/DOSE AEPB Inhale 1 puff into the lungs 2 (two) times daily. 60 each   . gabapentin (NEURONTIN) 300 MG capsule Take 2 capsules (600 mg total) by mouth 3 (three) times daily. (Patient taking differently: Take 300 mg by mouth at bedtime. ) 180 capsule 3  . Insulin Glargine (TOUJEO SOLOSTAR) 300 UNIT/ML SOPN Inject 60 Units 2 (two) times daily into the skin. 12 pen 5  . insulin regular (HUMULIN R) 100 units/mL injection Inject 15-25 into the skin 3 (three) times daily before meals. Sliding scale 30 mL 5  . losartan (COZAAR) 100 MG tablet Take 1 tablet (100 mg total) by mouth daily. 90 tablet 3  . lovastatin (MEVACOR) 20 MG tablet Take 20 mg by mouth daily.    . metoCLOPramide (REGLAN) 5 MG tablet Take 1 tablet (5 mg total) by mouth 4 (four) times daily -  before meals and at bedtime. 90 tablet 3  . metoprolol succinate (TOPROL-XL) 100 MG 24 hr tablet Take 1 tablet (100 mg total) by mouth daily. Take with or immediately following a meal. 30 tablet 6  . metoprolol tartrate 75 MG TABS Take 75 mg by mouth 2 (two) times daily. 60 tablet 0  . mirtazapine (REMERON SOL-TAB) 15 MG disintegrating tablet TAKE 1 TABLET BY MOUTH AT BEDTIME. 30 tablet 0  . montelukast (SINGULAIR) 10 MG tablet Take 10 mg by mouth daily as needed (allergies).     . nitroGLYCERIN (NITROSTAT) 0.4 MG SL tablet Place 1 tablet (0.4 mg total) under the tongue every 5 (five) minutes as needed. 25 tablet 3  . ondansetron (ZOFRAN) 4 MG tablet Take 1 tablet (4 mg total) by mouth daily as needed for nausea or vomiting. 30 tablet 0  . pantoprazole (PROTONIX) 40 MG tablet  Take 1 tablet (40 mg total) by mouth daily. 30 tablet 2  . senna-docusate (SENOKOT-S) 8.6-50 MG tablet Take 1 tablet by mouth 2 (two) times daily.    . [DISCONTINUED] ranitidine (ZANTAC) 75 MG/5ML syrup Take 10 mLs (150 mg total) by mouth 2 (two) times daily. (Patient not taking: Reported on 10/23/2014) 180 mL 0   No current facility-administered medications on file prior to visit.     Allergies:  Allergies  Allergen Reactions  . Penicillin G Anaphylaxis, Itching, Swelling and Rash  . Penicillins Anaphylaxis, Itching, Swelling and Rash    Swelling of throat & whole mouth  Has patient had a PCN reaction causing immediate rash, facial/tongue/throat swelling, SOB or lightheadedness with hypotension: Yes Has patient had a PCN reaction causing severe rash involving mucus membranes or skin necrosis: No Has patient had a PCN  reaction that required hospitalization: No Has patient had a PCN reaction occurring within the last 10 years: No If all of the above answers are "NO", then may proceed with Cephalosporin use.  [name] Lisinopril Cough    Review of Systems:  CONSTITUTIONAL: No fevers, chills, night sweats, or weight loss.  EYES: No visual changes or eye pain ENT: No hearing changes.  No history of nose bleeds.   RESPIRATORY: No cough, wheezing and shortness of breath.   CARDIOVASCULAR: Negative for chest pain, and palpitations.   GI: Negative for abdominal discomfort, blood in stools or black stools.  No recent change in bowel habits.   GU:  No history of incontinence.   MUSCLOSKELETAL: No history of joint pain or swelling.  No myalgias.   SKIN: Negative for lesions, rash, and itching.   ENDOCRINE: Negative for cold or heat intolerance, polydipsia or goiter.   PSYCH:  + depression or anxiety symptoms.   NEURO: As Above.   Vital Signs:  BP 130/80   Pulse 94   Ht 5\' 5"  (1.651 m)   Wt 172 lb (78 kg)   SpO2 97%   BMI 28.62 kg/m   General:  Well-appearing, comfortable  Neurological  Exam:  Neurological Exam: MENTAL STATUS including orientation to time, place, person, recent and remote memory, attention span and concentration, language, and fund of knowledge is normal.  Speech is not dysarthric.  CRANIAL NERVES:  Face is symmetric.   MOTOR:  Motor strength is 5/5 in all extremities, except R toe flexion 2/5 and finger abductors and extensors 4/5.  She has tenderness to palpation over the left shoulder and proximal arm and neck.  MSRs:  Reflexes are 2+/4 in the arms and absent in the legs  SENSORY:  Vibration is reduced to 50% at the knees and trace distal to ankles bilaterally.   She has reduced temperature over the finger tips.  GAIT:  Mildly antalgic, wide-based, stable and unassisted   DATA:   Lab Results  Component Value Date   HGBA1C 11.1 11/22/2016   Lab Results  Component Value Date   [id number] 233 11/05/2016   Lab Results  Component Value Date   TSH 1.930 07/26/2016   NCS/EMG 11/23/2016 right side: The electrophysiologic findings are consistent with a chronic, length-dependent sensorimotor polyneuropathy, axon loss and demyelinating in type, affecting the right arm and leg.     IMPRESSION/PLAN: 1.  Diabetic neuropathy in the setting of poorly controlled insulin dependent diabetes manfiesting with stocking-glove distribution of paresthesias, distal weakness, and sensory ataxia. Unfortuntely, she does not have any relief with gabapentin and developed side effects, so is being tapered off this.  She also takes Cymbalta 60mg /d (takes for mood), or nortriptyline 20mg  at bedtime.  I will further try to optimize nortriptyline to 30mg  at bedtime x 1 month, then increase to 40mg  qhs.  Recheck EKG in 1 month to assess QTc.  Due to CKD, unable to use carbamazepine or Lyrica.   Continue alpha lipoic acid 600mg  daily  2.  Left shoulder pain ?musculoskeletal.  No relief with NSAIDs or tylenol.  I will offer her a trial of flexeril 5mg  at bedtime.  If no  improvement, recommend f/u with PCP.  Pain is not neuropathic in nature.   3.  Poorly controlled diabetes complicated by CKD (Cr. 0.07, GFR 18), neuropathy, and gastroparesis.  She is seeing Dr. Cruzita Lederer, I also encouraged medication compliance  Return to clinic in 4 months  Greater than 50% of this 25 minute visit  was spent in counseling, explanation of diagnosis, planning of further management, and coordination of care.    Thank you for allowing me to participate in patient's care.  If I can answer any additional questions, I would be pleased to do so.    Sincerely,    Elesha Thedford K. Posey Pronto, DO

## 2017-01-05 NOTE — Addendum Note (Signed)
Addended by: Chester Holstein on: 01/05/2017 08:43 AM   Modules accepted: Orders

## 2017-01-05 NOTE — Patient Instructions (Addendum)
Check EKG in 1 month  Increase nortriptyline to 30mg  at bedtime x 1 month, then increase to 40mg  at bedtime  For your shoulder pain, take flexeril 5mg  at bedtime as needed  Return to clinic in 4 months

## 2017-01-06 ENCOUNTER — Encounter (HOSPITAL_COMMUNITY)
Admission: RE | Admit: 2017-01-06 | Discharge: 2017-01-06 | Disposition: A | Discharge status: Home / Self Care | Payer: Medicaid Other | Source: Ambulatory Visit | Attending: Nephrology | Admitting: Nephrology

## 2017-01-06 VITALS — BP 160/77 | HR 95 | Temp 98.6°F | Resp 18 | Ht 65.0 in | Wt 170.0 lb

## 2017-01-06 DIAGNOSIS — D631 Anemia in chronic kidney disease: Secondary | ICD-10-CM | POA: Insufficient documentation

## 2017-01-06 DIAGNOSIS — N184 Chronic kidney disease, stage 4 (severe): Secondary | ICD-10-CM

## 2017-01-06 DIAGNOSIS — N189 Chronic kidney disease, unspecified: Secondary | ICD-10-CM | POA: Insufficient documentation

## 2017-01-06 LAB — POCT HEMOGLOBIN-HEMACUE: HEMOGLOBIN: 9.1 g/dL — AB (ref 12.0–15.0)

## 2017-01-06 MED ORDER — EPOETIN ALFA 20000 UNIT/ML IJ SOLN: 20000.0000 [IU] | INTRAMUSCULAR | Status: DC

## 2017-01-06 MED ORDER — EPOETIN ALFA 20000 UNIT/ML IJ SOLN
INTRAMUSCULAR | Status: AC
  Administered 2017-01-06: 20000 [IU]
  Filled 2017-01-06: qty 1

## 2017-01-06 MED ORDER — FERUMOXYTOL INJECTION 510 MG/17 ML
510.0000 mg | Freq: Once | INTRAVENOUS | Status: AC
  Administered 2017-01-06: 510 mg via INTRAVENOUS
  Filled 2017-01-06: qty 17

## 2017-01-06 NOTE — Discharge Instructions (Signed)
Epoetin Alfa injection °What is this medicine? °EPOETIN ALFA (e POE e tin AL fa) helps your body make more red blood cells. This medicine is used to treat anemia caused by chronic kidney failure, cancer chemotherapy, or HIV-therapy. It may also be used before surgery if you have anemia. °This medicine may be used for other purposes; ask your health care provider or pharmacist if you have questions. °COMMON BRAND NAME(S): Epogen, Procrit °What should I tell my health care provider before I take this medicine? °They need to know if you have any of these conditions: °-blood clotting disorders °-cancer patient not on chemotherapy °-cystic fibrosis °-heart disease, such as angina or heart failure °-hemoglobin level of 12 g/dL or greater °-high blood pressure °-low levels of folate, iron, or vitamin B12 °-seizures °-an unusual or allergic reaction to erythropoietin, albumin, benzyl alcohol, hamster proteins, other medicines, foods, dyes, or preservatives °-pregnant or trying to get pregnant °-breast-feeding °How should I use this medicine? °This medicine is for injection into a vein or under the skin. It is usually given by a health care professional in a hospital or clinic setting. °If you get this medicine at home, you will be taught how to prepare and give this medicine. Use exactly as directed. Take your medicine at regular intervals. Do not take your medicine more often than directed. °It is important that you put your used needles and syringes in a special sharps container. Do not put them in a trash can. If you do not have a sharps container, call your pharmacist or healthcare provider to get one. °A special MedGuide will be given to you by the pharmacist with each prescription and refill. Be sure to read this information carefully each time. °Talk to your pediatrician regarding the use of this medicine in children. While this drug may be prescribed for selected conditions, precautions do apply. °Overdosage: If you  think you have taken too much of this medicine contact a poison control center or emergency room at once. °NOTE: This medicine is only for you. Do not share this medicine with others. °What if I miss a dose? °If you miss a dose, take it as soon as you can. If it is almost time for your next dose, take only that dose. Do not take double or extra doses. °What may interact with this medicine? °Do not take this medicine with any of the following medications: °-darbepoetin alfa °This list may not describe all possible interactions. Give your health care provider a list of all the medicines, herbs, non-prescription drugs, or dietary supplements you use. Also tell them if you smoke, drink alcohol, or use illegal drugs. Some items may interact with your medicine. °What should I watch for while using this medicine? °Your condition will be monitored carefully while you are receiving this medicine. °You may need blood work done while you are taking this medicine. °What side effects may I notice from receiving this medicine? °Side effects that you should report to your doctor or health care professional as soon as possible: °-allergic reactions like skin rash, itching or hives, swelling of the face, lips, or tongue °-breathing problems °-changes in vision °-chest pain °-confusion, trouble speaking or understanding °-feeling faint or lightheaded, falls °-high blood pressure °-muscle aches or pains °-pain, swelling, warmth in the leg °-rapid weight gain °-severe headaches °-sudden numbness or weakness of the face, arm or leg °-trouble walking, dizziness, loss of balance or coordination °-seizures (convulsions) °-swelling of the ankles, feet, hands °-unusually weak or tired °  Side effects that usually do not require medical attention (report to your doctor or health care professional if they continue or are bothersome): °-diarrhea °-fever, chills (flu-like symptoms) °-headaches °-nausea, vomiting °-redness, stinging, or swelling at  site where injected °This list may not describe all possible side effects. Call your doctor for medical advice about side effects. You may report side effects to FDA at 1-800-FDA-1088. °Where should I keep my medicine? °Keep out of the reach of children. °Store in a refrigerator between 2 and 8 degrees C (36 and 46 degrees F). Do not freeze or shake. Throw away any unused portion if using a single-dose vial. Multi-dose vials can be kept in the refrigerator for up to 21 days after the initial dose. Throw away unused medicine. °NOTE: This sheet is a summary. It may not cover all possible information. If you have questions about this medicine, talk to your doctor, pharmacist, or health care provider. °© 2018 [employer]vier/Gold Standard (2015-08-18 19:42:31) ° °Ferumoxytol injection °What is this medicine? °FERUMOXYTOL is an iron complex. Iron is used to make healthy red blood cells, which carry oxygen and nutrients throughout the body. This medicine is used to treat iron deficiency anemia in people with chronic kidney disease. °This medicine may be used for other purposes; ask your health care provider or pharmacist if you have questions. °COMMON BRAND NAME(S): Feraheme °What should I tell my health care provider before I take this medicine? °They need to know if you have any of these conditions: °-anemia not caused by low iron levels °-high levels of iron in the blood °-magnetic resonance imaging (MRI) test scheduled °-an unusual or allergic reaction to iron, other medicines, foods, dyes, or preservatives °-pregnant or trying to get pregnant °-breast-feeding °How should I use this medicine? °This medicine is for injection into a vein. It is given by a health care professional in a hospital or clinic setting. °Talk to your pediatrician regarding the use of this medicine in children. Special care may be needed. °Overdosage: If you think you have taken too much of this medicine contact a poison control center or emergency  room at once. °NOTE: This medicine is only for you. Do not share this medicine with others. °What if I miss a dose? °It is important not to miss your dose. Call your doctor or health care professional if you are unable to keep an appointment. °What may interact with this medicine? °This medicine may interact with the following medications: °-other iron products °This list may not describe all possible interactions. Give your health care provider a list of all the medicines, herbs, non-prescription drugs, or dietary supplements you use. Also tell them if you smoke, drink alcohol, or use illegal drugs. Some items may interact with your medicine. °What should I watch for while using this medicine? °Visit your doctor or healthcare professional regularly. Tell your doctor or healthcare professional if your symptoms do not start to get better or if they get worse. You may need blood work done while you are taking this medicine. °You may need to follow a special diet. Talk to your doctor. Foods that contain iron include: whole grains/cereals, dried fruits, beans, or peas, leafy green vegetables, and organ meats (liver, kidney). °What side effects may I notice from receiving this medicine? °Side effects that you should report to your doctor or health care professional as soon as possible: °-allergic reactions like skin rash, itching or hives, swelling of the face, lips, or tongue °-breathing problems °-changes in blood pressure °-feeling   faint or lightheaded, falls °-fever or chills °-flushing, sweating, or hot feelings °-swelling of the ankles or feet °Side effects that usually do not require medical attention (report to your doctor or health care professional if they continue or are bothersome): °-diarrhea °-headache °-nausea, vomiting °-stomach pain °This list may not describe all possible side effects. Call your doctor for medical advice about side effects. You may report side effects to FDA at 1-800-FDA-1088. °Where  should I keep my medicine? °This drug is given in a hospital or clinic and will not be stored at home. °NOTE: This sheet is a summary. It may not cover all possible information. If you have questions about this medicine, talk to your doctor, pharmacist, or health care provider. °© 2018 Elsevier/Gold Standard (2015-01-30 12:41:49) ° ° ° °

## 2017-01-14 ENCOUNTER — Ambulatory Visit (INDEPENDENT_AMBULATORY_CARE_PROVIDER_SITE_OTHER)
Admission: RE | Admit: 2017-01-14 | Discharge: 2017-01-14 | Disposition: A | Discharge status: Home / Self Care | Payer: Medicaid Other | Source: Ambulatory Visit | Attending: Vascular Surgery | Admitting: Vascular Surgery

## 2017-01-14 ENCOUNTER — Ambulatory Visit (HOSPITAL_COMMUNITY)
Admission: RE | Admit: 2017-01-14 | Discharge: 2017-01-14 | Disposition: A | Discharge status: Home / Self Care | Payer: Medicaid Other | Source: Ambulatory Visit | Attending: Vascular Surgery | Admitting: Vascular Surgery

## 2017-01-14 DIAGNOSIS — N183 Chronic kidney disease, stage 3 unspecified: Secondary | ICD-10-CM

## 2017-01-14 DIAGNOSIS — Z01810 Encounter for preprocedural cardiovascular examination: Secondary | ICD-10-CM | POA: Diagnosis not present

## 2017-01-20 ENCOUNTER — Encounter: Payer: Self-pay | Admitting: *Deleted

## 2017-01-20 ENCOUNTER — Ambulatory Visit (INDEPENDENT_AMBULATORY_CARE_PROVIDER_SITE_OTHER): Payer: Medicaid Other | Admitting: Vascular Surgery

## 2017-01-20 ENCOUNTER — Other Ambulatory Visit: Payer: Self-pay | Admitting: *Deleted

## 2017-01-20 ENCOUNTER — Encounter: Payer: Self-pay | Admitting: Vascular Surgery

## 2017-01-20 VITALS — BP 152/92 | HR 99 | Temp 98.6°F | Resp 16 | Ht 65.0 in | Wt 165.8 lb

## 2017-01-20 DIAGNOSIS — N184 Chronic kidney disease, stage 4 (severe): Secondary | ICD-10-CM | POA: Diagnosis not present

## 2017-01-20 NOTE — H&P (View-Only) (Signed)
Referring Physician: Mauricia Area MD  Patient name: Susan Horton MRN: [id number] DOB: 31-Aug-1970 Sex: female  REASON FOR CONSULT: Hemodialysis access  HPI: Susan Horton is a 47 y.o. female with stage IV kidney disease referred by Dr Deterding for placement of a long-term hemodialysis access.  She is right-handed.  Renal failure thought to be secondary to diabetes.  She has had no prior access procedures.  Other medical problems include bilateral upper extremity peripheral neuropathy, coronary artery disease, hypertension, gastroparesis all of which have been stable.  Past Medical History:  Diagnosis Date  . Asthma   . CAD (coronary artery disease)    DES to mid LAD July 2018, residual moderate RCA disease  . Cataract   . CKD (chronic kidney disease) stage 4, GFR 15-29 ml/min (HCC)   . DVT (deep venous thrombosis) ([place])    20+ yrs ago (during pregnancy), 2015  . Essential hypertension 12/11/2015  . Gastroparesis   . GERD (gastroesophageal reflux disease)   . Hidradenitis   . Migraine   . Type 2 diabetes mellitus ([place])    Past Surgical History:  Procedure Laterality Date  . ABDOMINOPLASTY    . APPENDECTOMY  1995  . CHOLECYSTECTOMY  1993  . CORONARY ANGIOPLASTY WITH STENT PLACEMENT    . CORONARY STENT INTERVENTION N/A 08/05/2016   Procedure: Coronary Stent Intervention;  Surgeon: [name], MD;  Location: [facility];  Service: Cardiovascular;  Laterality: N/A;  . EXPLORATORY LAPAROTOMY  08/14/2005   lysis of adhesions, drainage of tubo-ovarian abscess  . HYDRADENITIS EXCISION  02/08/2011   Procedure: EXCISION HYDRADENITIS GROIN;  Surgeon: [name], MD;  Location: [facility];  Service: General;  Laterality: N/A;  Excisioin of Hidradenitis Left groin  . INGUINAL HIDRADENITIS EXCISION  07/06/2010   bilateral  . LEFT HEART CATH AND CORONARY ANGIOGRAPHY N/A 08/05/2016   Procedure: Left Heart Cath and Coronary Angiography;  Surgeon: [name], MD;  Location: [facility];  Service: Cardiovascular;  Laterality: N/A;  . REDUCTION MAMMAPLASTY  2002  . TONSILLECTOMY    . TUBAL LIGATION  1996  . VAGINAL HYSTERECTOMY  08/04/2005   and cysto    Family History  Problem Relation Age of Onset  . Hypertension Father   . Hypertension Sister   . Hypertension Brother   . Breast cancer Cousin        x2    SOCIAL HISTORY: Social History   Socioeconomic History  . Marital status: Single    Spouse name: Not on file  . Number of children: 3  . Years of education: 66  . Highest education level: Not on file  Social Needs  . Financial resource strain: Not on file  . Food insecurity - worry: Not on file  . Food insecurity - inability: Not on file  . Transportation needs - medical: Not on file  . Transportation needs - non-medical: Not on file  Occupational History  . Occupation: Curator  Tobacco Use  . Smoking status: Current Some Day Smoker    Years: 20.00    Types: Cigarettes  . Smokeless tobacco: Never Used  . Tobacco comment: Smokes on rare occasions  Substance and Sexual Activity  . Alcohol use: No    Alcohol/week: 0.0 oz  . Drug use: No  . Sexual activity: Not on file  Other Topics Concern  . Not on file  Social History Narrative   Lives with daughter in a 2  story home.  Has 3 children.  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  Education: college.     Allergies  Allergen Reactions  . Penicillin G Anaphylaxis, Itching, Swelling and Rash  . Penicillins Anaphylaxis, Itching, Swelling and Rash    Swelling of throat & whole mouth  Has patient had a PCN reaction causing immediate rash, facial/tongue/throat swelling, SOB or lightheadedness with hypotension: Yes Has patient had a PCN reaction causing severe rash involving mucus membranes or skin necrosis: No Has patient had a PCN reaction that required hospitalization: No Has patient had a PCN reaction occurring within  the last 10 years: No If all of the above answers are "NO", then may proceed with Cephalosporin use.  [name] Lisinopril Cough    Current Outpatient Medications  Medication Sig Dispense Refill  . albuterol (PROVENTIL HFA;VENTOLIN HFA) 108 (90 BASE) MCG/ACT inhaler Inhale 1-2 puffs into the lungs every 6 (six) hours as needed for wheezing or shortness of breath.     [name] amLODipine (NORVASC) 10 MG tablet Take 1 tablet (10 mg total) by mouth daily. 90 tablet 3  . aspirin 81 MG chewable tablet Chew 1 tablet (81 mg total) by mouth daily. 30 tablet 0  . Calcifediol ER (RAYALDEE) 30 MCG CPCR Take 30 mcg by mouth daily.     . calcitRIOL (ROCALTROL) 0.25 MCG capsule Take 0.25 mcg by mouth daily.  6  . Capsaicin-Menthol-Methyl Sal (CAPSAICIN-METHYL SAL-MENTHOL) 0.025-1-12 % CREA Apply 1 application topically 4 (four) times daily as needed. For aches/pains, apply to skin. May need gloves. (Patient taking differently: Apply 1 application topically 4 (four) times daily as needed (for aches or pains (may need gloves)). ) 1 Tube 3  . clopidogrel (PLAVIX) 75 MG tablet Take 1 tablet (75 mg total) by mouth daily with breakfast. 90 tablet 2  . cyclobenzaprine (FLEXERIL) 5 MG tablet Take 1 tablet (5 mg total) by mouth at bedtime as needed for muscle spasms. 30 tablet 3  . DULoxetine (CYMBALTA) 60 MG capsule Take 1 capsule (60 mg total) by mouth daily. 30 capsule 3  . Ferrous Sulfate (IRON) 325 (65 Fe) MG TABS Take 325 mg by mouth daily. 30 each 3  . Fluticasone-Salmeterol (ADVAIR) 250-50 MCG/DOSE AEPB Inhale 1 puff into the lungs 2 (two) times daily. 60 each   . gabapentin (NEURONTIN) 300 MG capsule Take 2 capsules (600 mg total) by mouth 3 (three) times daily. (Patient taking differently: Take 300 mg by mouth at bedtime. ) 180 capsule 3  . Insulin Glargine (TOUJEO SOLOSTAR) 300 UNIT/ML SOPN Inject 60 Units 2 (two) times daily into the skin. 12 pen 5  . insulin regular (HUMULIN R) 100 units/mL injection Inject 15-25 into  the skin 3 (three) times daily before meals. Sliding scale 30 mL 5  . losartan (COZAAR) 100 MG tablet Take 1 tablet (100 mg total) by mouth daily. 90 tablet 3  . lovastatin (MEVACOR) 20 MG tablet Take 20 mg by mouth daily.    . metoCLOPramide (REGLAN) 5 MG tablet Take 1 tablet (5 mg total) by mouth 4 (four) times daily -  before meals and at bedtime. 90 tablet 3  . metoprolol succinate (TOPROL-XL) 100 MG 24 hr tablet Take 1 tablet (100 mg total) by mouth daily. Take with or immediately following a meal. 30 tablet 6  . metoprolol tartrate 75 MG TABS Take 75 mg by mouth 2 (two) times daily. 60 tablet 0  . mirtazapine (REMERON SOL-TAB) 15 MG disintegrating tablet TAKE  1 TABLET BY MOUTH AT BEDTIME. 30 tablet 0  . montelukast (SINGULAIR) 10 MG tablet Take 10 mg by mouth daily as needed (allergies).     . nitroGLYCERIN (NITROSTAT) 0.4 MG SL tablet Place 1 tablet (0.4 mg total) under the tongue every 5 (five) minutes as needed. 25 tablet 3  . nortriptyline (PAMELOR) 10 MG capsule Take 3 tablet at bedtime x 1 month, then increase to 4 tablet at bedtime. 120 capsule 5  . ondansetron (ZOFRAN) 4 MG tablet Take 1 tablet (4 mg total) by mouth daily as needed for nausea or vomiting. 30 tablet 0  . pantoprazole (PROTONIX) 40 MG tablet Take 1 tablet (40 mg total) by mouth daily. 30 tablet 2  . senna-docusate (SENOKOT-S) 8.6-50 MG tablet Take 1 tablet by mouth 2 (two) times daily.     No current facility-administered medications for this visit.     ROS:   General:  No weight loss, Fever, chills  HEENT: No recent headaches, no nasal bleeding, no visual changes, no sore throat  Neurologic: No dizziness, blackouts, seizures. No recent symptoms of stroke or mini- stroke. No recent episodes of slurred speech, or temporary blindness.  Cardiac: No recent episodes of chest pain/pressure, no shortness of breath at rest.  No shortness of breath with exertion.  Denies history of atrial fibrillation or irregular  heartbeat  Vascular: No history of rest pain in feet.  No history of claudication.  No history of non-healing ulcer, No history of DVT   Pulmonary: No home oxygen, no productive cough, no hemoptysis,  No asthma or wheezing  Musculoskeletal:  [ ]  Arthritis, [ ]  Low back pain,  [ ]  Joint pain  Hematologic:No history of hypercoagulable state.  No history of easy bleeding.  No history of anemia  Gastrointestinal: No hematochezia or melena,  No gastroesophageal reflux, no trouble swallowing  Urinary: [X]  chronic Kidney disease, [ ]  on HD - [ ]  MWF or [ ]  TTHS, [ ]  Burning with urination, [ ]  Frequent urination, [ ]  Difficulty urinating;   Skin: No rashes  Psychological: No history of anxiety,  No history of depression   Physical Examination  Vitals:   01/20/17 1105  BP: (!) 152/92  Pulse: 99  Resp: 16  Temp: 98.6 F (37 C)  TempSrc: Oral  SpO2: 100%  Weight: 165 lb 12.8 oz (75.2 kg)  Height: 5\' 5"  (1.651 m)    Body mass index is 27.59 kg/m.  General:  Alert and oriented, no acute distress HEENT: Normal Neck: No bruit or JVD Pulmonary: Clear to auscultation bilaterally Cardiac: Regular Rate and Rhythm without murmur Skin: No rash Extremity Pulses:  2+ radial, brachial pulses bilaterally Musculoskeletal: No deformity or edema  Neurologic: Upper and lower extremity motor 5/5 and symmetric  DATA:  Patient had bilateral upper extremity arterial and venous duplex scan today.  This showed the radial artery was quite small bilaterally 2 mm or less.  The cephalic vein was small in the forearm bilaterally however it was 4 mm in diameter in the left upper arm and 3 mm in diameter in the right upper arm.  Basilic vein was small bilaterally.  I reviewed and interpreted the studies.  ASSESSMENT: Patient needs long-term hemodialysis access.  She is currently CKD 4.   PLAN: Left brachiocephalic AV fistula scheduled for February 01, 2017.  Risk benefits possible complications and  procedure details including but not limited to bleeding infection ischemic steal non-maturation of the fistula were explained to the patient today.  She understands and agrees to proceed.   Ruta Hinds, MD [facility] Office: [phone] Pager: [phone]

## 2017-01-20 NOTE — Progress Notes (Signed)
Referring Physician: Mauricia Area MD  Patient name: Susan Horton MRN: [id number] DOB: [birth year] Sex: female  REASON FOR CONSULT: Hemodialysis access  HPI: Susan Horton is a 47 y.o. female with stage IV kidney disease referred by Dr Deterding for placement of a long-term hemodialysis access.  She is right-handed.  Renal failure thought to be secondary to diabetes.  She has had no prior access procedures.  Other medical problems include bilateral upper extremity peripheral neuropathy, coronary artery disease, hypertension, gastroparesis all of which have been stable.  Past Medical History:  Diagnosis Date  . Asthma   . CAD (coronary artery disease)    DES to mid LAD July 2018, residual moderate RCA disease  . Cataract   . CKD (chronic kidney disease) stage 4, GFR 15-29 ml/min (HCC)   . DVT (deep venous thrombosis) ([place])    20+ yrs ago (during pregnancy), 2015  . Essential hypertension 12/11/2015  . Gastroparesis   . GERD (gastroesophageal reflux disease)   . Hidradenitis   . Migraine   . Type 2 diabetes mellitus ([place])    Past Surgical History:  Procedure Laterality Date  . ABDOMINOPLASTY    . APPENDECTOMY  1995  . CHOLECYSTECTOMY  1993  . CORONARY ANGIOPLASTY WITH STENT PLACEMENT    . CORONARY STENT INTERVENTION N/A 08/05/2016   Procedure: Coronary Stent Intervention;  Surgeon: [name], MD;  Location: [facility];  Service: Cardiovascular;  Laterality: N/A;  . EXPLORATORY LAPAROTOMY  08/14/2005   lysis of adhesions, drainage of tubo-ovarian abscess  . HYDRADENITIS EXCISION  02/08/2011   Procedure: EXCISION HYDRADENITIS GROIN;  Surgeon: [name], MD;  Location: [facility];  Service: General;  Laterality: N/A;  Excisioin of Hidradenitis Left groin  . INGUINAL HIDRADENITIS EXCISION  07/06/2010   bilateral  . LEFT HEART CATH AND CORONARY ANGIOGRAPHY N/A 08/05/2016   Procedure: Left Heart Cath and Coronary Angiography;  Surgeon: [name], MD;  Location: [facility];  Service: Cardiovascular;  Laterality: N/A;  . REDUCTION MAMMAPLASTY  2002  . TONSILLECTOMY    . TUBAL LIGATION  1996  . VAGINAL HYSTERECTOMY  08/04/2005   and cysto    Family History  Problem Relation Age of Onset  . Hypertension Father   . Hypertension Sister   . Hypertension Brother   . Breast cancer Cousin        x2    SOCIAL HISTORY: Social History   Socioeconomic History  . Marital status: Single    Spouse name: Not on file  . Number of children: 3  . Years of education: 58  . Highest education level: Not on file  Social Needs  . Financial resource strain: Not on file  . Food insecurity - worry: Not on file  . Food insecurity - inability: Not on file  . Transportation needs - medical: Not on file  . Transportation needs - non-medical: Not on file  Occupational History  . Occupation: Curator  Tobacco Use  . Smoking status: Current Some Day Smoker    Years: 20.00    Types: Cigarettes  . Smokeless tobacco: Never Used  . Tobacco comment: Smokes on rare occasions  Substance and Sexual Activity  . Alcohol use: No    Alcohol/week: 0.0 oz  . Drug use: No  . Sexual activity: Not on file  Other Topics Concern  . Not on file  Social History Narrative   Lives with daughter in a 2  story home.  Has 3 children.  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  Education: college.     Allergies  Allergen Reactions  . Penicillin G Anaphylaxis, Itching, Swelling and Rash  . Penicillins Anaphylaxis, Itching, Swelling and Rash    Swelling of throat & whole mouth  Has patient had a PCN reaction causing immediate rash, facial/tongue/throat swelling, SOB or lightheadedness with hypotension: Yes Has patient had a PCN reaction causing severe rash involving mucus membranes or skin necrosis: No Has patient had a PCN reaction that required hospitalization: No Has patient had a PCN reaction occurring within  the last 10 years: No If all of the above answers are "NO", then may proceed with Cephalosporin use.  [name] Lisinopril Cough    Current Outpatient Medications  Medication Sig Dispense Refill  . albuterol (PROVENTIL HFA;VENTOLIN HFA) 108 (90 BASE) MCG/ACT inhaler Inhale 1-2 puffs into the lungs every 6 (six) hours as needed for wheezing or shortness of breath.     [name] amLODipine (NORVASC) 10 MG tablet Take 1 tablet (10 mg total) by mouth daily. 90 tablet 3  . aspirin 81 MG chewable tablet Chew 1 tablet (81 mg total) by mouth daily. 30 tablet 0  . Calcifediol ER (RAYALDEE) 30 MCG CPCR Take 30 mcg by mouth daily.     . calcitRIOL (ROCALTROL) 0.25 MCG capsule Take 0.25 mcg by mouth daily.  6  . Capsaicin-Menthol-Methyl Sal (CAPSAICIN-METHYL SAL-MENTHOL) 0.025-1-12 % CREA Apply 1 application topically 4 (four) times daily as needed. For aches/pains, apply to skin. May need gloves. (Patient taking differently: Apply 1 application topically 4 (four) times daily as needed (for aches or pains (may need gloves)). ) 1 Tube 3  . clopidogrel (PLAVIX) 75 MG tablet Take 1 tablet (75 mg total) by mouth daily with breakfast. 90 tablet 2  . cyclobenzaprine (FLEXERIL) 5 MG tablet Take 1 tablet (5 mg total) by mouth at bedtime as needed for muscle spasms. 30 tablet 3  . DULoxetine (CYMBALTA) 60 MG capsule Take 1 capsule (60 mg total) by mouth daily. 30 capsule 3  . Ferrous Sulfate (IRON) 325 (65 Fe) MG TABS Take 325 mg by mouth daily. 30 each 3  . Fluticasone-Salmeterol (ADVAIR) 250-50 MCG/DOSE AEPB Inhale 1 puff into the lungs 2 (two) times daily. 60 each   . gabapentin (NEURONTIN) 300 MG capsule Take 2 capsules (600 mg total) by mouth 3 (three) times daily. (Patient taking differently: Take 300 mg by mouth at bedtime. ) 180 capsule 3  . Insulin Glargine (TOUJEO SOLOSTAR) 300 UNIT/ML SOPN Inject 60 Units 2 (two) times daily into the skin. 12 pen 5  . insulin regular (HUMULIN R) 100 units/mL injection Inject 15-25 into  the skin 3 (three) times daily before meals. Sliding scale 30 mL 5  . losartan (COZAAR) 100 MG tablet Take 1 tablet (100 mg total) by mouth daily. 90 tablet 3  . lovastatin (MEVACOR) 20 MG tablet Take 20 mg by mouth daily.    . metoCLOPramide (REGLAN) 5 MG tablet Take 1 tablet (5 mg total) by mouth 4 (four) times daily -  before meals and at bedtime. 90 tablet 3  . metoprolol succinate (TOPROL-XL) 100 MG 24 hr tablet Take 1 tablet (100 mg total) by mouth daily. Take with or immediately following a meal. 30 tablet 6  . metoprolol tartrate 75 MG TABS Take 75 mg by mouth 2 (two) times daily. 60 tablet 0  . mirtazapine (REMERON SOL-TAB) 15 MG disintegrating tablet TAKE  1 TABLET BY MOUTH AT BEDTIME. 30 tablet 0  . montelukast (SINGULAIR) 10 MG tablet Take 10 mg by mouth daily as needed (allergies).     . nitroGLYCERIN (NITROSTAT) 0.4 MG SL tablet Place 1 tablet (0.4 mg total) under the tongue every 5 (five) minutes as needed. 25 tablet 3  . nortriptyline (PAMELOR) 10 MG capsule Take 3 tablet at bedtime x 1 month, then increase to 4 tablet at bedtime. 120 capsule 5  . ondansetron (ZOFRAN) 4 MG tablet Take 1 tablet (4 mg total) by mouth daily as needed for nausea or vomiting. 30 tablet 0  . pantoprazole (PROTONIX) 40 MG tablet Take 1 tablet (40 mg total) by mouth daily. 30 tablet 2  . senna-docusate (SENOKOT-S) 8.6-50 MG tablet Take 1 tablet by mouth 2 (two) times daily.     No current facility-administered medications for this visit.     ROS:   General:  No weight loss, Fever, chills  HEENT: No recent headaches, no nasal bleeding, no visual changes, no sore throat  Neurologic: No dizziness, blackouts, seizures. No recent symptoms of stroke or mini- stroke. No recent episodes of slurred speech, or temporary blindness.  Cardiac: No recent episodes of chest pain/pressure, no shortness of breath at rest.  No shortness of breath with exertion.  Denies history of atrial fibrillation or irregular  heartbeat  Vascular: No history of rest pain in feet.  No history of claudication.  No history of non-healing ulcer, No history of DVT   Pulmonary: No home oxygen, no productive cough, no hemoptysis,  No asthma or wheezing  Musculoskeletal:  [ ]  Arthritis, [ ]  Low back pain,  [ ]  Joint pain  Hematologic:No history of hypercoagulable state.  No history of easy bleeding.  No history of anemia  Gastrointestinal: No hematochezia or melena,  No gastroesophageal reflux, no trouble swallowing  Urinary: [X]  chronic Kidney disease, [ ]  on HD - [ ]  MWF or [ ]  TTHS, [ ]  Burning with urination, [ ]  Frequent urination, [ ]  Difficulty urinating;   Skin: No rashes  Psychological: No history of anxiety,  No history of depression   Physical Examination  Vitals:   01/20/17 1105  BP: (!) 152/92  Pulse: 99  Resp: 16  Temp: 98.6 F (37 C)  TempSrc: Oral  SpO2: 100%  Weight: 165 lb 12.8 oz (75.2 kg)  Height: 5\' 5"  (1.651 m)    Body mass index is 27.59 kg/m.  General:  Alert and oriented, no acute distress HEENT: Normal Neck: No bruit or JVD Pulmonary: Clear to auscultation bilaterally Cardiac: Regular Rate and Rhythm without murmur Skin: No rash Extremity Pulses:  2+ radial, brachial pulses bilaterally Musculoskeletal: No deformity or edema  Neurologic: Upper and lower extremity motor 5/5 and symmetric  DATA:  Patient had bilateral upper extremity arterial and venous duplex scan today.  This showed the radial artery was quite small bilaterally 2 mm or less.  The cephalic vein was small in the forearm bilaterally however it was 4 mm in diameter in the left upper arm and 3 mm in diameter in the right upper arm.  Basilic vein was small bilaterally.  I reviewed and interpreted the studies.  ASSESSMENT: Patient needs long-term hemodialysis access.  She is currently CKD 4.   PLAN: Left brachiocephalic AV fistula scheduled for February 01, 2017.  Risk benefits possible complications and  procedure details including but not limited to bleeding infection ischemic steal non-maturation of the fistula were explained to the patient today.  She understands and agrees to proceed.   Ruta Hinds, MD [facility] Office: [phone] Pager: [phone]

## 2017-01-21 ENCOUNTER — Encounter (HOSPITAL_COMMUNITY)
Admission: RE | Admit: 2017-01-21 | Discharge: 2017-01-21 | Disposition: A | Discharge status: Home / Self Care | Payer: Medicaid Other | Source: Ambulatory Visit | Attending: Nephrology | Admitting: Nephrology

## 2017-01-21 VITALS — BP 177/89 | HR 113 | Temp 98.1°F | Resp 18

## 2017-01-21 DIAGNOSIS — D631 Anemia in chronic kidney disease: Secondary | ICD-10-CM | POA: Insufficient documentation

## 2017-01-21 DIAGNOSIS — N184 Chronic kidney disease, stage 4 (severe): Secondary | ICD-10-CM

## 2017-01-21 DIAGNOSIS — N189 Chronic kidney disease, unspecified: Secondary | ICD-10-CM | POA: Diagnosis not present

## 2017-01-21 LAB — POCT HEMOGLOBIN-HEMACUE: HEMOGLOBIN: 10.8 g/dL — AB (ref 12.0–15.0)

## 2017-01-21 MED ORDER — EPOETIN ALFA 20000 UNIT/ML IJ SOLN
INTRAMUSCULAR | Status: AC
  Filled 2017-01-21: qty 1

## 2017-01-21 MED ORDER — EPOETIN ALFA 20000 UNIT/ML IJ SOLN
20000.0000 [IU] | INTRAMUSCULAR | Status: DC
  Administered 2017-01-21: 20000 [IU] via SUBCUTANEOUS

## 2017-01-31 ENCOUNTER — Other Ambulatory Visit: Payer: Self-pay

## 2017-01-31 ENCOUNTER — Encounter (HOSPITAL_COMMUNITY): Payer: Self-pay | Admitting: *Deleted

## 2017-01-31 ENCOUNTER — Ambulatory Visit: Payer: Medicaid Other | Admitting: Neurology

## 2017-01-31 NOTE — Progress Notes (Addendum)
Susan Horton is a Type II diabetic, last A1C was 11.1.  I instructed patient to take 1/2 of scheduled Toujeo tonight. Patient reported that if she takes 1/2 of Toujeo dose that her blood sugar will be too high in am.  "I went for cataract surgery 2 times and got sent home, because they had instructed me to take 1/2 dose."  I spoke with Dr Ermalene Postin and shared patient's concerns.  Dr Ermalene Postin ordered that patient take full dose of Toujeo tonight and to keep a check of CBG and if CBG begins to drop too much that patient should sip on clear juice.  I spoke with patient and gave her the instructions and if CBG < 70 to drink 1/2 cup of clear juice.  If CBG > 220 to take 1/2 dose of SS Insulin and to taker 1/2 dose of scheduled Toujeo in am.  I also informed Dr Ermalene Postin of patient's cardiac  Cath with DES stent placed 7/ 2018 and that patient was instructed to continue Aspirin and Plavix. No new orders given by Dr Ermalene Postin.

## 2017-02-01 ENCOUNTER — Encounter (HOSPITAL_COMMUNITY): Payer: Self-pay | Admitting: Certified Registered Nurse Anesthetist

## 2017-02-01 ENCOUNTER — Ambulatory Visit (HOSPITAL_COMMUNITY): Payer: Medicaid Other | Admitting: Anesthesiology

## 2017-02-01 ENCOUNTER — Encounter (HOSPITAL_COMMUNITY)
Admission: RE | Disposition: A | Discharge status: Home / Self Care | Payer: Self-pay | Source: Ambulatory Visit | Attending: Vascular Surgery

## 2017-02-01 ENCOUNTER — Ambulatory Visit (HOSPITAL_COMMUNITY)
Admission: RE | Admit: 2017-02-01 | Discharge: 2017-02-01 | Disposition: A | Discharge status: Home / Self Care | Payer: Medicaid Other | Source: Ambulatory Visit | Attending: Vascular Surgery | Admitting: Vascular Surgery

## 2017-02-01 DIAGNOSIS — F17210 Nicotine dependence, cigarettes, uncomplicated: Secondary | ICD-10-CM | POA: Diagnosis not present

## 2017-02-01 DIAGNOSIS — E1143 Type 2 diabetes mellitus with diabetic autonomic (poly)neuropathy: Secondary | ICD-10-CM | POA: Diagnosis not present

## 2017-02-01 DIAGNOSIS — K219 Gastro-esophageal reflux disease without esophagitis: Secondary | ICD-10-CM | POA: Insufficient documentation

## 2017-02-01 DIAGNOSIS — Z880 Allergy status to penicillin: Secondary | ICD-10-CM | POA: Insufficient documentation

## 2017-02-01 DIAGNOSIS — J45909 Unspecified asthma, uncomplicated: Secondary | ICD-10-CM | POA: Diagnosis not present

## 2017-02-01 DIAGNOSIS — N184 Chronic kidney disease, stage 4 (severe): Secondary | ICD-10-CM | POA: Insufficient documentation

## 2017-02-01 DIAGNOSIS — I129 Hypertensive chronic kidney disease with stage 1 through stage 4 chronic kidney disease, or unspecified chronic kidney disease: Secondary | ICD-10-CM | POA: Insufficient documentation

## 2017-02-01 DIAGNOSIS — Z79899 Other long term (current) drug therapy: Secondary | ICD-10-CM | POA: Diagnosis not present

## 2017-02-01 DIAGNOSIS — Z794 Long term (current) use of insulin: Secondary | ICD-10-CM | POA: Insufficient documentation

## 2017-02-01 DIAGNOSIS — Z955 Presence of coronary angioplasty implant and graft: Secondary | ICD-10-CM | POA: Insufficient documentation

## 2017-02-01 DIAGNOSIS — Z7982 Long term (current) use of aspirin: Secondary | ICD-10-CM | POA: Insufficient documentation

## 2017-02-01 DIAGNOSIS — E1122 Type 2 diabetes mellitus with diabetic chronic kidney disease: Secondary | ICD-10-CM | POA: Insufficient documentation

## 2017-02-01 DIAGNOSIS — K3184 Gastroparesis: Secondary | ICD-10-CM | POA: Diagnosis not present

## 2017-02-01 DIAGNOSIS — I2510 Atherosclerotic heart disease of native coronary artery without angina pectoris: Secondary | ICD-10-CM | POA: Insufficient documentation

## 2017-02-01 DIAGNOSIS — N185 Chronic kidney disease, stage 5: Secondary | ICD-10-CM

## 2017-02-01 HISTORY — DX: Anemia, unspecified: D64.9

## 2017-02-01 HISTORY — DX: Type 2 diabetes mellitus without complications: E11.9

## 2017-02-01 HISTORY — PX: AV FISTULA PLACEMENT: SHX1204

## 2017-02-01 HISTORY — DX: Polyneuropathy, unspecified: G62.9

## 2017-02-01 LAB — GLUCOSE, CAPILLARY
GLUCOSE-CAPILLARY: 233 mg/dL — AB (ref 65–99)
Glucose-Capillary: 186 mg/dL — ABNORMAL HIGH (ref 65–99)

## 2017-02-01 LAB — POCT I-STAT 4, (NA,K, GLUC, HGB,HCT)
Glucose, Bld: 191 mg/dL — ABNORMAL HIGH (ref 65–99)
HCT: 33 % — ABNORMAL LOW (ref 36.0–46.0)
HEMOGLOBIN: 11.2 g/dL — AB (ref 12.0–15.0)
Potassium: 4.6 mmol/L (ref 3.5–5.1)
SODIUM: 139 mmol/L (ref 135–145)

## 2017-02-01 LAB — HEMOGLOBIN A1C
HEMOGLOBIN A1C: 10 % — AB (ref 4.8–5.6)
MEAN PLASMA GLUCOSE: 240.3 mg/dL

## 2017-02-01 LAB — PROTIME-INR
INR: 0.91
PROTHROMBIN TIME: 12.2 s (ref 11.4–15.2)

## 2017-02-01 SURGERY — ARTERIOVENOUS (AV) FISTULA CREATION
Anesthesia: General | Laterality: Left

## 2017-02-01 MED ORDER — SUCCINYLCHOLINE CHLORIDE 200 MG/10ML IV SOSY
PREFILLED_SYRINGE | INTRAVENOUS | Status: AC
  Filled 2017-02-01: qty 10

## 2017-02-01 MED ORDER — MIDAZOLAM HCL 2 MG/2ML IJ SOLN
INTRAMUSCULAR | Status: DC | PRN
  Administered 2017-02-01 (×2): 1 mg via INTRAVENOUS

## 2017-02-01 MED ORDER — FENTANYL CITRATE (PF) 100 MCG/2ML IJ SOLN
25.0000 ug | INTRAMUSCULAR | Status: DC | PRN
  Administered 2017-02-01: 50 ug via INTRAVENOUS

## 2017-02-01 MED ORDER — OXYCODONE-ACETAMINOPHEN 5-325 MG PO TABS: 1.0000 | ORAL_TABLET | Freq: Four times a day (QID) | ORAL | 0 refills | Status: DC | PRN

## 2017-02-01 MED ORDER — ONDANSETRON HCL 4 MG/2ML IJ SOLN
INTRAMUSCULAR | Status: DC | PRN
  Administered 2017-02-01: 4 mg via INTRAVENOUS

## 2017-02-01 MED ORDER — 0.9 % SODIUM CHLORIDE (POUR BTL) OPTIME
TOPICAL | Status: DC | PRN
  Administered 2017-02-01: 1000 mL

## 2017-02-01 MED ORDER — LIDOCAINE HCL (PF) 1 % IJ SOLN
INTRAMUSCULAR | Status: AC
  Filled 2017-02-01: qty 30

## 2017-02-01 MED ORDER — ONDANSETRON HCL 4 MG/2ML IJ SOLN
INTRAMUSCULAR | Status: AC
  Filled 2017-02-01: qty 2

## 2017-02-01 MED ORDER — PROPOFOL 10 MG/ML IV BOLUS
INTRAVENOUS | Status: DC | PRN
  Administered 2017-02-01: 160 mg via INTRAVENOUS

## 2017-02-01 MED ORDER — SODIUM CHLORIDE 0.9 % IV SOLN
INTRAVENOUS | Status: DC | PRN
  Administered 2017-02-01: 500 mL

## 2017-02-01 MED ORDER — SODIUM CHLORIDE 0.9 % IV SOLN
INTRAVENOUS | Status: DC
  Administered 2017-02-01: 07:00:00 via INTRAVENOUS

## 2017-02-01 MED ORDER — ONDANSETRON HCL 4 MG/2ML IJ SOLN: 4.0000 mg | Freq: Once | INTRAMUSCULAR | Status: DC | PRN

## 2017-02-01 MED ORDER — OXYCODONE HCL 5 MG PO TABS: 5.0000 mg | ORAL_TABLET | Freq: Once | ORAL | Status: DC | PRN

## 2017-02-01 MED ORDER — PROPOFOL 10 MG/ML IV BOLUS
INTRAVENOUS | Status: AC
  Filled 2017-02-01: qty 20

## 2017-02-01 MED ORDER — ROCURONIUM BROMIDE 10 MG/ML (PF) SYRINGE
PREFILLED_SYRINGE | INTRAVENOUS | Status: AC
  Filled 2017-02-01: qty 5

## 2017-02-01 MED ORDER — DEXAMETHASONE SODIUM PHOSPHATE 10 MG/ML IJ SOLN
INTRAMUSCULAR | Status: DC | PRN
  Administered 2017-02-01: 5 mg via INTRAVENOUS

## 2017-02-01 MED ORDER — FENTANYL CITRATE (PF) 100 MCG/2ML IJ SOLN
INTRAMUSCULAR | Status: AC
  Filled 2017-02-01: qty 2

## 2017-02-01 MED ORDER — PHENYLEPHRINE HCL 10 MG/ML IJ SOLN
INTRAVENOUS | Status: DC | PRN
  Administered 2017-02-01: 20 ug/min via INTRAVENOUS

## 2017-02-01 MED ORDER — LIDOCAINE 2% (20 MG/ML) 5 ML SYRINGE
INTRAMUSCULAR | Status: AC
  Filled 2017-02-01: qty 5

## 2017-02-01 MED ORDER — PHENYLEPHRINE 40 MCG/ML (10ML) SYRINGE FOR IV PUSH (FOR BLOOD PRESSURE SUPPORT)
PREFILLED_SYRINGE | INTRAVENOUS | Status: AC
  Filled 2017-02-01: qty 10

## 2017-02-01 MED ORDER — HEPARIN SODIUM (PORCINE) 1000 UNIT/ML IJ SOLN
INTRAMUSCULAR | Status: DC | PRN
  Administered 2017-02-01: 5000 [IU] via INTRAVENOUS

## 2017-02-01 MED ORDER — OXYCODONE HCL 5 MG/5ML PO SOLN
5.0000 mg | Freq: Once | ORAL | Status: DC | PRN
Start: 2017-02-01 — End: 2017-02-01

## 2017-02-01 MED ORDER — MIDAZOLAM HCL 2 MG/2ML IJ SOLN
INTRAMUSCULAR | Status: AC
  Filled 2017-02-01: qty 2

## 2017-02-01 MED ORDER — LIDOCAINE 2% (20 MG/ML) 5 ML SYRINGE
INTRAMUSCULAR | Status: DC | PRN
  Administered 2017-02-01: 60 mg via INTRAVENOUS

## 2017-02-01 MED ORDER — DEXAMETHASONE SODIUM PHOSPHATE 10 MG/ML IJ SOLN
INTRAMUSCULAR | Status: AC
  Filled 2017-02-01: qty 1

## 2017-02-01 MED ORDER — CHLORHEXIDINE GLUCONATE 4 % EX LIQD: 60.0000 mL | Freq: Once | CUTANEOUS | Status: DC

## 2017-02-01 MED ORDER — FENTANYL CITRATE (PF) 250 MCG/5ML IJ SOLN
INTRAMUSCULAR | Status: AC
  Filled 2017-02-01: qty 5

## 2017-02-01 MED ORDER — VANCOMYCIN HCL IN DEXTROSE 1-5 GM/200ML-% IV SOLN
1000.0000 mg | INTRAVENOUS | Status: AC
  Administered 2017-02-01: 1000 mg via INTRAVENOUS
  Filled 2017-02-01: qty 200

## 2017-02-01 MED ORDER — HEPARIN SODIUM (PORCINE) 1000 UNIT/ML IJ SOLN
INTRAMUSCULAR | Status: AC
  Filled 2017-02-01: qty 3

## 2017-02-01 MED ORDER — FENTANYL CITRATE (PF) 100 MCG/2ML IJ SOLN
INTRAMUSCULAR | Status: DC | PRN
  Administered 2017-02-01 (×2): 50 ug via INTRAVENOUS

## 2017-02-01 MED ORDER — PHENYLEPHRINE 40 MCG/ML (10ML) SYRINGE FOR IV PUSH (FOR BLOOD PRESSURE SUPPORT)
PREFILLED_SYRINGE | INTRAVENOUS | Status: DC | PRN
  Administered 2017-02-01: 80 ug via INTRAVENOUS

## 2017-02-01 SURGICAL SUPPLY — 35 items
ADH SKN CLS APL DERMABOND .7 (GAUZE/BANDAGES/DRESSINGS) ×1
ARMBAND PINK RESTRICT EXTREMIT (MISCELLANEOUS) ×4 IMPLANT
CANISTER SUCT 3000ML PPV (MISCELLANEOUS) ×2 IMPLANT
CANNULA VESSEL 3MM 2 BLNT TIP (CANNULA) ×2 IMPLANT
CLIP VESOCCLUDE MED 6/CT (CLIP) ×2 IMPLANT
CLIP VESOCCLUDE SM WIDE 6/CT (CLIP) ×2 IMPLANT
COVER PROBE W GEL 5X96 (DRAPES) IMPLANT
DECANTER SPIKE VIAL GLASS SM (MISCELLANEOUS) ×2 IMPLANT
DERMABOND ADVANCED (GAUZE/BANDAGES/DRESSINGS) ×1
DERMABOND ADVANCED .7 DNX12 (GAUZE/BANDAGES/DRESSINGS) ×1 IMPLANT
DRAIN PENROSE 1/4X12 LTX STRL (WOUND CARE) ×3 IMPLANT
ELECT REM PT RETURN 9FT ADLT (ELECTROSURGICAL) ×2
ELECTRODE REM PT RTRN 9FT ADLT (ELECTROSURGICAL) ×1 IMPLANT
GLOVE BIO SURGEON STRL SZ 6.5 (GLOVE) ×2 IMPLANT
GLOVE BIO SURGEON STRL SZ7.5 (GLOVE) ×2 IMPLANT
GLOVE BIOGEL PI IND STRL 6.5 (GLOVE) IMPLANT
GLOVE BIOGEL PI INDICATOR 6.5 (GLOVE) ×1
GLOVE ECLIPSE 6.5 STRL STRAW (GLOVE) ×1 IMPLANT
GOWN STRL REUS W/ TWL LRG LVL3 (GOWN DISPOSABLE) ×3 IMPLANT
GOWN STRL REUS W/TWL LRG LVL3 (GOWN DISPOSABLE) ×8
KIT BASIN OR (CUSTOM PROCEDURE TRAY) ×2 IMPLANT
KIT ROOM TURNOVER OR (KITS) ×2 IMPLANT
LOOP VESSEL MINI RED (MISCELLANEOUS) IMPLANT
NS IRRIG 1000ML POUR BTL (IV SOLUTION) ×2 IMPLANT
PACK CV ACCESS (CUSTOM PROCEDURE TRAY) ×2 IMPLANT
PAD ARMBOARD 7.5X6 YLW CONV (MISCELLANEOUS) ×4 IMPLANT
SPONGE SURGIFOAM ABS GEL 100 (HEMOSTASIS) IMPLANT
SUT PROLENE 6 0 BV (SUTURE) ×2 IMPLANT
SUT PROLENE 7 0 BV 1 (SUTURE) ×4 IMPLANT
SUT VIC AB 3-0 SH 27 (SUTURE) ×2
SUT VIC AB 3-0 SH 27X BRD (SUTURE) ×1 IMPLANT
SUT VICRYL 4-0 PS2 18IN ABS (SUTURE) ×2 IMPLANT
TOWEL GREEN STERILE (TOWEL DISPOSABLE) ×2 IMPLANT
UNDERPAD 30X30 (UNDERPADS AND DIAPERS) ×2 IMPLANT
WATER STERILE IRR 1000ML POUR (IV SOLUTION) ×2 IMPLANT

## 2017-02-01 NOTE — Addendum Note (Signed)
Addendum  created 02/01/17 1048 by Roberts Gaudy, MD   [place] filed, Sign clinical note

## 2017-02-01 NOTE — Anesthesia Preprocedure Evaluation (Signed)
Anesthesia Evaluation  Patient identified by MRN, date of birth, ID band Patient awake    Reviewed: Allergy & Precautions, NPO status , Patient's Chart, lab work & pertinent test results  Airway Mallampati: II  TM Distance: >3 FB Neck ROM: Full    Dental  (+) Teeth Intact, Dental Advisory Given   Pulmonary former smoker,    breath sounds clear to auscultation       Cardiovascular hypertension,  Rhythm:Regular Rate:Normal     Neuro/Psych    GI/Hepatic   Endo/Other  diabetes  Renal/GU      Musculoskeletal   Abdominal   Peds  Hematology   Anesthesia Other Findings   Reproductive/Obstetrics                             Anesthesia Physical Anesthesia Plan  ASA: III  Anesthesia Plan: General   Post-op Pain Management:    Induction: Intravenous  PONV Risk Score and Plan: 1 and Ondansetron and Dexamethasone  Airway Management Planned: LMA  Additional Equipment:   Intra-op Plan:   Post-operative Plan:   Informed Consent: I have reviewed the patients History and Physical, chart, labs and discussed the procedure including the risks, benefits and alternatives for the proposed anesthesia with the patient or authorized representative who has indicated his/her understanding and acceptance.   Dental advisory given  Plan Discussed with: CRNA and Anesthesiologist  Anesthesia Plan Comments:         Anesthesia Quick Evaluation

## 2017-02-01 NOTE — Discharge Instructions (Signed)
° °  [facility]  Discharge Instructions  AV Fistula or Graft Surgery for Dialysis Access  Please refer to the following instructions for your post-procedure care. Your surgeon or physician assistant will discuss any changes with you.  Activity  You may drive the day following your surgery, if you are comfortable and no longer taking prescription pain medication. Resume full activity as the soreness in your incision resolves.  Bathing/Showering  You may shower after you go home. Keep your incision dry for 48 hours. Do not soak in a bathtub, hot tub, or swim until the incision heals completely. You may not shower if you have a hemodialysis catheter.  Incision Care  Clean your incision with mild soap and water after 48 hours. Pat the area dry with a clean towel. You do not need a bandage unless otherwise instructed. Do not apply any ointments or creams to your incision. You may have skin glue on your incision. Do not peel it off. It will come off on its own in about one week. Your arm may swell a bit after surgery. To reduce swelling use pillows to elevate your arm so it is above your heart. Your doctor will tell you if you need to lightly wrap your arm with an ACE bandage.  Diet  Resume your normal diet. There are not special food restrictions following this procedure. In order to heal from your surgery, it is CRITICAL to get adequate nutrition. Your body requires vitamins, minerals, and protein. Vegetables are the best source of vitamins and minerals. Vegetables also provide the perfect balance of protein. Processed food has little nutritional value, so try to avoid this.  Medications  Resume taking all of your medications. If your incision is causing pain, you may take over-the counter pain relievers such as acetaminophen (Tylenol). If you were prescribed a stronger pain medication, please be aware these medications can cause nausea and constipation. Prevent  nausea by taking the medication with a snack or meal. Avoid constipation by drinking plenty of fluids and eating foods with high amount of fiber, such as fruits, vegetables, and grains. Do not take Tylenol if you are taking prescription pain medications.  Follow up Your surgeon may want to see you in the office following your access surgery. If so, this will be arranged at the time of your surgery.  Please call us immediately for any of the following conditions:  Increased pain, redness, drainage (pus) from your incision site Fever of 101 degrees or higher Severe or worsening pain at your incision site Hand pain or numbness.  Reduce your risk of vascular disease:  Stop smoking. If you would like help, call [facility] at 1-800-QUIT-NOW 803-434-6202) or [facility] [place] your cholesterol Maintain a desired weight Control your diabetes Keep your blood pressure down  Dialysis  It will take several weeks to several months for your new dialysis access to be ready for use. Your surgeon will determine when it is OK to use it. Your nephrologist will continue to direct your dialysis. You can continue to use your Permcath until your new access is ready for use.   02/01/2017 Susan Horton [id number] 1970/06/25  Surgeon(s): [name], MD  Procedure(s): Left Brachiocephalic AV fistula  x Do not stick fistula for 12 weeks    If you have any questions, please call the office at [phone].

## 2017-02-01 NOTE — Anesthesia Postprocedure Evaluation (Signed)
Anesthesia Post Note  Patient: Susan Horton  Procedure(s) Performed: ARTERIOVENOUS BRACHIOCEPHALIC (AV) FISTULA CREATION (Left )     Patient location during evaluation: PACU Anesthesia Type: General Level of consciousness: awake, awake and alert and oriented Pain management: pain level controlled Vital Signs Assessment: post-procedure vital signs reviewed and stable Respiratory status: spontaneous breathing, nonlabored ventilation and respiratory function stable Cardiovascular status: blood pressure returned to baseline Anesthetic complications: no    Last Vitals:  Vitals:   02/01/17 0915 02/01/17 0930  BP: 140/80 120/73  Pulse: 83 81  Resp: 20 18  Temp:    SpO2: 92% 95%    Last Pain:  Vitals:   02/01/17 0856  TempSrc:   PainSc: 0-No pain                 Ryley Teater COKER

## 2017-02-01 NOTE — Anesthesia Postprocedure Evaluation (Signed)
Anesthesia Post Note  Patient: Susan Horton  Procedure(s) Performed: ARTERIOVENOUS BRACHIOCEPHALIC (AV) FISTULA CREATION (Left )     Patient location during evaluation: PACU Anesthesia Type: General Level of consciousness: awake and alert Pain management: pain level controlled Vital Signs Assessment: post-procedure vital signs reviewed and stable Respiratory status: spontaneous breathing, nonlabored ventilation, respiratory function stable and patient connected to nasal cannula oxygen Cardiovascular status: blood pressure returned to baseline and stable Postop Assessment: no apparent nausea or vomiting Anesthetic complications: no    Last Vitals:  Vitals:   02/01/17 1007 02/01/17 1010  BP:  123/66  Pulse: 85 79  Resp: 15 16  Temp: (!) 36.4 C (!) 36.4 C  SpO2: 96% 98%    Last Pain:  Vitals:   02/01/17 1010  TempSrc:   PainSc: 3                  Nickolis Diel COKER

## 2017-02-01 NOTE — Op Note (Signed)
Procedure: Left Brachial Cephalic AV fistula  Preop: ESRD  Postop: ESRD  Anesthesia: General  Assistant:Samantha Rhyne, PA-C  Findings: 3.5 mm cephalic vein  Procedure: After obtaining informed consent, the patient was taken to the operating room.  After induction of general anesthesia, the left upper extremity was prepped and draped in usual sterile fashion.  A transverse incision was then made near the antecubital crease the left arm. The incision was carried into the subcutaneous tissues down to level of the cephalic vein. The cephalic vein was approximately 3.5 mm in diameter. It was of good quality. This was dissected free circumferentially and small side branches ligated and divided between silk ties or clips. Next the brachial artery was dissected free in the medial portion of the incision. The artery was  3-4 mm in diameter. The vessel loops were placed proximal and distal to the planned site of arteriotomy. The patient was given 5000 units of intravenous heparin. After appropriate circulation time, the vessel loops were used to control the artery. A longitudinal opening was made in the brachial artery.  The vein was ligated distally with a 2-0 silk tie. The vein was controlled proximally with a fine bulldog clamp. The vein was then swung over to the artery and sewn end of vein to side of artery using a running 7-0 Prolene suture. Just prior to completion of the anastomosis, everything was fore bled back bled and thoroughly flushed. The anastomosis was secured, vessel loops released, and there was a palpable thrill in the fistula immediately. After hemostasis was obtained, the subcutaneous tissues were reapproximated using a running 3-0 Vicryl suture. The skin was then closed with a 4 Vicryl subcuticular stitch. Dermabond was applied to the skin incision.  The patient had a radial doppler at the end of the case.  Ruta Hinds, MD [facility] Office:  [phone] Pager: [phone]

## 2017-02-01 NOTE — Anesthesia Procedure Notes (Signed)
Procedure Name: LMA Insertion Date/Time: 02/01/2017 7:36 AM Performed by: Julieta Bellini, CRNA Pre-anesthesia Checklist: Patient identified, Emergency Drugs available, Suction available and Patient being monitored Patient Re-evaluated:Patient Re-evaluated prior to induction Oxygen Delivery Method: Circle system utilized Preoxygenation: Pre-oxygenation with 100% oxygen Induction Type: IV induction Ventilation: Mask ventilation without difficulty LMA: LMA inserted LMA Size: 4.0 Number of attempts: 1 Placement Confirmation: positive ETCO2 and breath sounds checked- equal and bilateral Tube secured with: Tape Dental Injury: Teeth and Oropharynx as per pre-operative assessment

## 2017-02-01 NOTE — Interval H&P Note (Signed)
History and Physical Interval Note:  02/01/2017 7:21 AM  Susan Horton  has presented today for surgery, with the diagnosis of Chronic kidney disease  The various methods of treatment have been discussed with the patient and family. After consideration of risks, benefits and other options for treatment, the patient has consented to  Procedure(s): ARTERIOVENOUS BRACHIOCEPHALIC (AV) FISTULA CREATION (Left) as a surgical intervention .  The patient's history has been reviewed, patient examined, no change in status, stable for surgery.  I have reviewed the patient's chart and labs.  Questions were answered to the patient's satisfaction.     Ruta Hinds

## 2017-02-01 NOTE — Transfer of Care (Signed)
Immediate Anesthesia Transfer of Care Note  Patient: Susan Horton  Procedure(s) Performed: ARTERIOVENOUS BRACHIOCEPHALIC (AV) FISTULA CREATION (Left )  Patient Location: PACU  Anesthesia Type:General  Level of Consciousness: drowsy and patient cooperative  Airway & Oxygen Therapy: Patient Spontanous Breathing  Post-op Assessment: Report given to RN, Post -op Vital signs reviewed and stable and Patient moving all extremities X 4  Post vital signs: Reviewed and stable  Last Vitals:  Vitals:   02/01/17 0553  BP: (!) 167/85  Pulse: 83  Resp: 18  Temp: 37 C  SpO2: 100%    Last Pain:  Vitals:   02/01/17 0553  TempSrc: Oral         Complications: No apparent anesthesia complications

## 2017-02-02 ENCOUNTER — Telehealth: Payer: Self-pay | Admitting: Vascular Surgery

## 2017-02-02 ENCOUNTER — Encounter (HOSPITAL_COMMUNITY): Payer: Self-pay | Admitting: Vascular Surgery

## 2017-02-02 NOTE — Telephone Encounter (Signed)
Sched appt 03/10/17; lab at 3:00 and MD at 4:00. Spoke to pt to inform them of appt.

## 2017-02-02 NOTE — Telephone Encounter (Signed)
-----   Message from Mena Goes, RN sent at 02/01/2017  9:07 AM EST ----- Regarding: 4-6 weeks w/ duplex   ----- Message ----- From: Gabriel Earing, PA-C Sent: 02/01/2017   8:44 AM To: [facility] Charge Pool  S/p left BC AVF 02/01/17.  F/u with Dr. [name] in 4-6 weeks with duplex.  Thanks

## 2017-02-04 ENCOUNTER — Encounter (HOSPITAL_COMMUNITY): Payer: Medicaid Other

## 2017-02-07 ENCOUNTER — Other Ambulatory Visit: Payer: Self-pay

## 2017-02-07 DIAGNOSIS — N183 Chronic kidney disease, stage 3 unspecified: Secondary | ICD-10-CM

## 2017-02-07 DIAGNOSIS — N184 Chronic kidney disease, stage 4 (severe): Secondary | ICD-10-CM

## 2017-02-09 ENCOUNTER — Encounter (HOSPITAL_COMMUNITY)
Admission: RE | Admit: 2017-02-09 | Discharge: 2017-02-09 | Disposition: A | Discharge status: Home / Self Care | Payer: Medicaid Other | Source: Ambulatory Visit | Attending: Nephrology | Admitting: Nephrology

## 2017-02-09 VITALS — BP 162/84 | HR 82 | Temp 98.1°F

## 2017-02-09 DIAGNOSIS — N189 Chronic kidney disease, unspecified: Secondary | ICD-10-CM | POA: Diagnosis not present

## 2017-02-09 DIAGNOSIS — N184 Chronic kidney disease, stage 4 (severe): Secondary | ICD-10-CM

## 2017-02-09 LAB — IRON AND TIBC
Iron: 37 ug/dL (ref 28–170)
Saturation Ratios: 20 % (ref 10.4–31.8)
TIBC: 185 ug/dL — ABNORMAL LOW (ref 250–450)
UIBC: 148 ug/dL

## 2017-02-09 LAB — FERRITIN: Ferritin: 588 ng/mL — ABNORMAL HIGH (ref 11–307)

## 2017-02-09 MED ORDER — EPOETIN ALFA 20000 UNIT/ML IJ SOLN
20000.0000 [IU] | INTRAMUSCULAR | Status: DC
  Administered 2017-02-09: 20000 [IU] via SUBCUTANEOUS

## 2017-02-09 MED ORDER — EPOETIN ALFA 20000 UNIT/ML IJ SOLN
INTRAMUSCULAR | Status: AC
  Filled 2017-02-09: qty 1

## 2017-02-10 LAB — POCT HEMOGLOBIN-HEMACUE: Hemoglobin: 10.1 g/dL — ABNORMAL LOW (ref 12.0–15.0)

## 2017-02-22 ENCOUNTER — Other Ambulatory Visit (HOSPITAL_COMMUNITY): Payer: Self-pay

## 2017-02-22 ENCOUNTER — Encounter: Payer: Self-pay | Admitting: Internal Medicine

## 2017-02-22 ENCOUNTER — Ambulatory Visit (INDEPENDENT_AMBULATORY_CARE_PROVIDER_SITE_OTHER): Payer: Medicaid Other | Admitting: Internal Medicine

## 2017-02-22 VITALS — BP 168/87 | HR 103 | Ht 65.0 in | Wt 166.6 lb

## 2017-02-22 DIAGNOSIS — E1142 Type 2 diabetes mellitus with diabetic polyneuropathy: Secondary | ICD-10-CM | POA: Diagnosis not present

## 2017-02-22 DIAGNOSIS — E1165 Type 2 diabetes mellitus with hyperglycemia: Secondary | ICD-10-CM

## 2017-02-22 DIAGNOSIS — G629 Polyneuropathy, unspecified: Secondary | ICD-10-CM

## 2017-02-22 DIAGNOSIS — IMO0002 Reserved for concepts with insufficient information to code with codable children: Secondary | ICD-10-CM

## 2017-02-22 MED ORDER — INSULIN REGULAR HUMAN 100 UNIT/ML IJ SOLN: INTRAMUSCULAR | 5 refills | Status: DC

## 2017-02-22 NOTE — Patient Instructions (Addendum)
Please continue: - Toujeo 60 units 2x a day  Please increase: - R insulin: - 20 units before a smaller meal - 25 units before a regular meal - 30 units before a larger meal or if you have dessert  Please return in 3 months with your sugar log.

## 2017-02-22 NOTE — Progress Notes (Signed)
Subjective:     Patient ID: Susan Horton, female   DOB: [birth year], 47 y.o.   MRN: [id number]  HPI Susan Horton is a 47 y.o. woman returning for f/u for DM (unclear if 1 or 2), dx in 1996 when pregnant with her daughter, insulin-dependent since 1999, uncontrolled, with complications (peripheral neuropathy, gastroparesis, DR, CKD).  Last visit 3 months ago. Now [place].  She has severe gastroparesis and will need a pacemaker.  She has frequent nausea, vomiting.  Last HbA1c: Lab Results  Component Value Date   HGBA1C 10.0 (H) 02/01/2017   HGBA1C 11.1 11/22/2016   HGBA1C 11.7 09/08/2016  07/24/2015: HbA1c 16%!  She was on: Insulin Before breakfast Before lunch Before dinner  Regular 20 20  25   NPH 40  30   + R insulin sliding scale as follows:  - 150- 165: + 1 unit  - 166- 180: + 2 units  - 181- 195: + 3 units  - 196- 210: + 4 units  - 211- 225: + 5 units  - 226- 240: + 6 units  - 241- 255: + 7 units  - 256- 270: + 8 units  - 271- 285: + 9 units  - > 286: + 10 units  Then at last visit she was on: - Toujeo 50 >> 60 units 2x a day - could not remember what dose taking!!!! - R insulin 40 units 3x a day after a meal >> 5-20 units ??? - could not remember what dose taking!!!!   - + Glipizide 10 mg 2x a day - started by PCP?  At last visit, we changed to: - Toujeo 60 units 2x a day - R insulin: decides on the dose ~ CBG before meals instead of the directions given below - 15 units  - 20 units  - 25 units  She checks her sugars 3 times a day: - am:  353-421 >> 280-421 >> 200-300s >> low 200s - mid-morning: 90 (if skips b'fast) - 134 >> n/c - before lunch:  3 541, HI  >> 230-469 >> 200-300s >> 200s - before dinner: 462 >> 368-HI >> 200-300s >> 200s - before bedtime: 383-591 >> 300s >> higher 200s-300 Lowest: 180 Highest: 300s  - + CKD.  Sees nephrology (Dr. Jimmy Footman). Just had a fistula placed 2 weeks ago in her arm recently. Last BUN/creatinine was: Lab Results  Component  Value Date   BUN 48 (H) 08/30/2016   Lab Results  Component Value Date   CREATININE 3.27 (H) 08/30/2016  07/24/2015: 45/2.27, GFR 28 On losartan.  -  + HL; Latest cholesterol level:  07/24/2015: 251/733/25/117 Lab Results  Component Value Date   CHOL 239 (H) 01/01/2010   HDL 33 (L) 01/01/2010   LDLCALC 176 (H) 01/01/2010   TRIG 149 01/01/2010   CHOLHDL 7.2 Ratio 01/01/2010  On lovastatin. - last eye exem: 01/2016: + DR, + macular edema. Had cataracts sx. Was getting IO inj's. - + tingling/numbness in feet >> sees podiatry  She had multiple visits to the ED and admissions for hyperglycemia, gastroparesis exacerbation.  She had a cardiac stent 07/2016.  Review of Systems Constitutional: no weight gain/no weight loss, + fatigue, + subjective hyperthermia, + subjective hypothermia, + nocturia Eyes: + blurry vision, no xerophthalmia ENT: no sore throat, no nodules palpated in throat, no dysphagia, no odynophagia, no hoarseness Cardiovascular: no CP/+ SOB/no palpitations/+ leg swelling Respiratory: + cough/+ SOB/no wheezing Gastrointestinal: + N/+ V/no D/+ C/+ acid reflux Musculoskeletal: no muscle aches/+  joint aches Skin: no rashes, no hair loss Neurological: no tremors/+ numbness/+ tingling/no dizziness  I reviewed pt's medications, allergies, PMH, social hx, family hx, and changes were documented in the history of present illness. Otherwise, unchanged from my initial visit note.  Objective:   Physical Exam BP (!) 168/87 (BP Location: Right Arm, Patient Position: Sitting, Cuff Size: Large)   Pulse (!) 103   Ht 5\' 5"  (1.651 m)   Wt 166 lb 9.6 oz (75.6 kg)   BMI 27.72 kg/m  Body mass index is 27.72 kg/m. Wt Readings from Last 3 Encounters:  02/22/17 166 lb 9.6 oz (75.6 kg)  02/01/17 169 lb (76.7 kg)  01/20/17 165 lb 12.8 oz (75.2 kg)   Constitutional: overweight, in NAD Eyes: PERRLA, EOMI, no exophthalmos ENT: moist mucous membranes, no thyromegaly, no cervical  lymphadenopathy Cardiovascular: tachycardia, RR, No MRG Respiratory: CTA B Gastrointestinal: abdomen soft, NT, ND, BS+ Musculoskeletal: no deformities, strength intact in all 4 Skin: moist, warm, no rashes Neurological: no tremor with outstretched hands, DTR normal in all 4    Assessment:     1. DM - unclear If type I or type II, insulin-dependent, uncontrolled, with complications - CKD - peripheral neuropathy - DR - gastroparesis 10/06/2005: GES: Clinical Data: 47 year-old, gastroparesis.  NUCLEAR MEDICINE GASTRIC EMPTYING SCAN:  Technique: After oral ingestion of radiolabeled meal, sequential abdominal images were obtained for 120 minutes. Residual percentage of activity remaining within the stomach was calculated at 60 and 120 minutes.  Radiopharmaceutical: 2 mCi Tc-38m sulfur colloid in egg meal.  Findings: Anterior imaging was performed over the stomach over a 2-hour period. At 60 minutes, close to 100% of the activity remains in the stomach, although there is some activity in the small bowel. At 2 hours, 91% of the activity remained in the stomach.  IMPRESSION:  Marked delayed solid phase gastric emptying consistent with gastroparesis.  Patient has been diagnosed with diabetes when she was 33, during her pregnancy, but it is unclear whether this is type I or type II. She has not have episodes of DKA, which would point more towards type 2 diabetes. I would like to check her C-peptide, however her sugars, and the C-peptide measurement is only valid if the glucose is lower than 200 at the time of the check.  Component     Latest Ref Rng 06/06/2014  Glutamic Acid Decarb Ab     0.0 - 5.0 U/mL <5.0  Pancreatic Islet Cell Antibody     Neg:<1:1 Negative   2. Diabetic peripheral neuropathy  Plan:     1. DM - Pt with poor DM control 2/2 noncompliance with checking sugars, bolusing, and coming for appointments, and also due to her uncontrolled gastroparesis.  She was  noncompliant with scheduled follow-ups in the past, but now returns after 3 months from the previous visit.  Her HbA1c continues to improve, but it is still high, with the latest being 10%.  - She is finally taking the recommended regimen, with Toujeo twice a day, and regular insulin.  We are using regular insulin since this  has better results in patients with gastroparesis.  She is pacing the dose of insulin before meals on the sugars before meals, which is a good idea, but she is not adjusting it based on the size of the meals.  We discussed about how to do that.  Since sugars continue to remain high, I advised her to increase the regular insulin.  She may benefit from R U500  insulin in the near future.  She is also interested  to get back on the pump, which we may consider in the future, however, she is using a very high dose of insulin for now so it would be better to use U500 insulin in the pump.   - Unfortunately, we cannot add other diabetic medicines due to her GI problems and also her CKD stage IV - I advised her to: Patient Instructions  Please continue: - Toujeo 60 units 2x a day  Please increase: - R insulin: - 20 units before a smaller meal - 25 units before a regular meal - 30 units before a larger meal or if you have dessert  Please return in 3 months with your sugar log.   - continue checking sugars at different times of the day - check 3x a day, rotating checks - advised for yearly eye exams >> she is UTD - Return to clinic in 3 mo with sugar log   2. Diabetic peripheral neuropathy - sees neurology - on Cymbalta, Neurontin >> still breakthrough sxs: pain, numbness - ALA + B complex did not work. Cannot use Lyrica 2/2 CKD.  Philemon Kingdom, MD PhD [facility]

## 2017-02-23 ENCOUNTER — Encounter (HOSPITAL_COMMUNITY)
Admission: RE | Admit: 2017-02-23 | Discharge: 2017-02-23 | Disposition: A | Discharge status: Home / Self Care | Payer: Medicaid Other | Source: Ambulatory Visit | Attending: Nephrology | Admitting: Nephrology

## 2017-02-23 VITALS — BP 158/83 | HR 102 | Temp 98.6°F | Resp 18

## 2017-02-23 DIAGNOSIS — D631 Anemia in chronic kidney disease: Secondary | ICD-10-CM | POA: Insufficient documentation

## 2017-02-23 DIAGNOSIS — N184 Chronic kidney disease, stage 4 (severe): Secondary | ICD-10-CM

## 2017-02-23 DIAGNOSIS — N189 Chronic kidney disease, unspecified: Secondary | ICD-10-CM | POA: Diagnosis not present

## 2017-02-23 LAB — POCT HEMOGLOBIN-HEMACUE: HEMOGLOBIN: 12.2 g/dL (ref 12.0–15.0)

## 2017-02-23 MED ORDER — EPOETIN ALFA 20000 UNIT/ML IJ SOLN: 20000.0000 [IU] | INTRAMUSCULAR | Status: DC

## 2017-02-23 MED ORDER — FERUMOXYTOL INJECTION 510 MG/17 ML
510.0000 mg | Freq: Once | INTRAVENOUS | Status: AC
  Administered 2017-02-23: 510 mg via INTRAVENOUS
  Filled 2017-02-23: qty 17

## 2017-03-09 ENCOUNTER — Encounter (HOSPITAL_COMMUNITY)
Admission: RE | Admit: 2017-03-09 | Discharge: 2017-03-09 | Disposition: A | Discharge status: Home / Self Care | Payer: Medicaid Other | Source: Ambulatory Visit | Attending: Nephrology | Admitting: Nephrology

## 2017-03-09 VITALS — BP 171/83 | HR 106 | Resp 18

## 2017-03-09 DIAGNOSIS — N189 Chronic kidney disease, unspecified: Secondary | ICD-10-CM | POA: Diagnosis not present

## 2017-03-09 DIAGNOSIS — N184 Chronic kidney disease, stage 4 (severe): Secondary | ICD-10-CM

## 2017-03-09 LAB — IRON AND TIBC
IRON: 40 ug/dL (ref 28–170)
SATURATION RATIOS: 20 % (ref 10.4–31.8)
TIBC: 202 ug/dL — ABNORMAL LOW (ref 250–450)
UIBC: 162 ug/dL

## 2017-03-09 LAB — FERRITIN: Ferritin: 935 ng/mL — ABNORMAL HIGH (ref 11–307)

## 2017-03-09 LAB — POCT HEMOGLOBIN-HEMACUE: Hemoglobin: 12 g/dL (ref 12.0–15.0)

## 2017-03-09 MED ORDER — EPOETIN ALFA 20000 UNIT/ML IJ SOLN: 20000.0000 [IU] | INTRAMUSCULAR | Status: DC

## 2017-03-10 ENCOUNTER — Ambulatory Visit (HOSPITAL_COMMUNITY)
Admission: RE | Admit: 2017-03-10 | Discharge: 2017-03-10 | Disposition: A | Discharge status: Home / Self Care | Payer: Medicaid Other | Source: Ambulatory Visit | Attending: Vascular Surgery | Admitting: Vascular Surgery

## 2017-03-10 ENCOUNTER — Other Ambulatory Visit: Payer: Self-pay

## 2017-03-10 ENCOUNTER — Ambulatory Visit (INDEPENDENT_AMBULATORY_CARE_PROVIDER_SITE_OTHER): Payer: Medicaid Other | Admitting: Vascular Surgery

## 2017-03-10 ENCOUNTER — Encounter: Payer: Self-pay | Admitting: Vascular Surgery

## 2017-03-10 VITALS — BP 151/91 | HR 76 | Temp 98.4°F | Resp 16 | Ht 65.0 in | Wt 165.0 lb

## 2017-03-10 DIAGNOSIS — N183 Chronic kidney disease, stage 3 unspecified: Secondary | ICD-10-CM

## 2017-03-10 DIAGNOSIS — I770 Arteriovenous fistula, acquired: Secondary | ICD-10-CM | POA: Insufficient documentation

## 2017-03-10 DIAGNOSIS — N184 Chronic kidney disease, stage 4 (severe): Secondary | ICD-10-CM

## 2017-03-10 NOTE — Progress Notes (Signed)
POST OPERATIVE OFFICE NOTE    CC:  F/u for surgery  HPI:  This is a 47 y.o. female who is s/p left brachial cephalic av fistula creation 02/01/2017.  She is not yet on HD.  She denise left hand weakness, loss of sensation or pain.    Allergies  Allergen Reactions  . Penicillins Anaphylaxis, Itching, Swelling, Rash and Other (See Comments)    Swelling of throat & whole mouth  Has patient had a PCN reaction causing immediate rash, facial/tongue/throat swelling, SOB or lightheadedness with hypotension: Yes Has patient had a PCN reaction causing severe rash involving mucus membranes or skin necrosis: No Has patient had a PCN reaction that required hospitalization: No Has patient had a PCN reaction occurring within the last 10 years: No If all of the above answers are "NO", then may proceed with Cephalosporin use.  [name] Lisinopril Cough    Current Outpatient Medications  Medication Sig Dispense Refill  . albuterol (PROVENTIL HFA;VENTOLIN HFA) 108 (90 BASE) MCG/ACT inhaler Inhale 1-2 puffs into the lungs every 6 (six) hours as needed for wheezing or shortness of breath.     [name] amLODipine (NORVASC) 10 MG tablet Take 1 tablet (10 mg total) by mouth daily. 90 tablet 3  . aspirin 81 MG chewable tablet Chew 1 tablet (81 mg total) by mouth daily. 30 tablet 0  . Calcifediol ER (RAYALDEE) 30 MCG CPCR Take 30 mcg by mouth daily.     . calcitRIOL (ROCALTROL) 0.25 MCG capsule Take 0.25 mcg by mouth daily.  6  . Capsaicin-Menthol-Methyl Sal (CAPSAICIN-METHYL SAL-MENTHOL) 0.025-1-12 % CREA Apply 1 application topically 4 (four) times daily as needed. For aches/pains, apply to skin. May need gloves. (Patient taking differently: Apply 1 application topically 4 (four) times daily as needed (for aches or pains (may need gloves)). ) 1 Tube 3  . Cholecalciferol (VITAMIN D PO) Take 1 capsule by mouth daily.    . clopidogrel (PLAVIX) 75 MG tablet Take 1 tablet (75 mg total) by mouth daily with breakfast. 90 tablet 2    . cyclobenzaprine (FLEXERIL) 5 MG tablet Take 1 tablet (5 mg total) by mouth at bedtime as needed for muscle spasms. 30 tablet 3  . DULoxetine (CYMBALTA) 60 MG capsule Take 1 capsule (60 mg total) by mouth daily. 30 capsule 3  . Ferrous Sulfate (IRON) 325 (65 Fe) MG TABS Take 325 mg by mouth daily. 30 each 3  . Fluticasone-Salmeterol (ADVAIR) 250-50 MCG/DOSE AEPB Inhale 1 puff into the lungs 2 (two) times daily. (Patient taking differently: Inhale 1 puff into the lungs 2 (two) times daily as needed (shortness of breath). ) 60 each   . gabapentin (NEURONTIN) 300 MG capsule Take 2 capsules (600 mg total) by mouth 3 (three) times daily. 180 capsule 3  . Insulin Glargine (TOUJEO SOLOSTAR) 300 UNIT/ML SOPN Inject 60 Units 2 (two) times daily into the skin. 12 pen 5  . insulin regular (HUMULIN R) 100 units/mL injection Inject 20-30 into the skin 3 (three) times daily before meals. Sliding scale 30 mL 5  . losartan (COZAAR) 100 MG tablet Take 1 tablet (100 mg total) by mouth daily. 90 tablet 3  . lovastatin (MEVACOR) 20 MG tablet Take 20 mg by mouth daily.    . metoCLOPramide (REGLAN) 5 MG tablet Take 1 tablet (5 mg total) by mouth 4 (four) times daily -  before meals and at bedtime. 90 tablet 3  . metoprolol succinate (TOPROL-XL) 100 MG 24 hr tablet Take 1 tablet (100  mg total) by mouth daily. Take with or immediately following a meal. 30 tablet 6  . mirtazapine (REMERON SOL-TAB) 15 MG disintegrating tablet TAKE 1 TABLET BY MOUTH AT BEDTIME. (Patient taking differently: TAKE 15 MG BY MOUTH AT BEDTIME.) 30 tablet 0  . montelukast (SINGULAIR) 10 MG tablet Take 10 mg by mouth daily as needed (allergies).     . nitroGLYCERIN (NITROSTAT) 0.4 MG SL tablet Place 1 tablet (0.4 mg total) under the tongue every 5 (five) minutes as needed. (Patient taking differently: Place 0.4 mg under the tongue every 5 (five) minutes as needed for chest pain. ) 25 tablet 3  . nortriptyline (PAMELOR) 10 MG capsule Take 3 tablet at  bedtime x 1 month, then increase to 4 tablet at bedtime. (Patient taking differently: Take 20 mg by mouth at bedtime. ) 120 capsule 5  . ondansetron (ZOFRAN) 4 MG tablet Take 1 tablet (4 mg total) by mouth daily as needed for nausea or vomiting. 30 tablet 0  . pantoprazole (PROTONIX) 40 MG tablet Take 1 tablet (40 mg total) by mouth daily. 30 tablet 2  . senna-docusate (SENOKOT-S) 8.6-50 MG tablet Take 1 tablet by mouth 2 (two) times daily. (Patient taking differently: Take 1 tablet by mouth 2 (two) times daily as needed for mild constipation. )    . oxyCODONE-acetaminophen (PERCOCET) 5-325 MG tablet Take 1 tablet by mouth every 6 (six) hours as needed for severe pain. (Patient not taking: Reported on 03/10/2017) 8 tablet 0   No current facility-administered medications for this visit.      ROS:  See HPI  Physical Exam:  Vitals:   03/10/17 1550 03/10/17 1552  BP: (!) 157/87 (!) 151/91  Pulse: 76 76  Resp: 16   Temp: 98.4 F (36.9 C)   SpO2: 94%     Incision:  Well healed Extremities:  Grip 5/5 B UE equal, palpable radial pulse B equal, palpable thrill throughout the left AV fistula.  Fistula duplex shows fistula is > 0.6 cm diameter and less than 0.6 cm deep.  Assessment/Plan:  This is a 47 y.o. female who is s/p: left BC av fistula creation maturing well.   The fistula will be ready for use in 2 months.  If she is not on HD in 6 months we will have her follow up with [name] for a check up.  I advised her to palpate a the thrill in her fistula daily.  If she is unable to feel the thrill she should call us immediately.     Roxy Horseman PA-C [facility] [phone]  Clinic MD:  [facility]  Screen exam details as above.  Patient currently not on hemodialysis.  She has no evidence of steal.  Fistula appears to be maturing.  It should be ready for use that she needs hemodialysis in the next few months.  Otherwise she will follow-up with [name] in 6 months for  recheck.  Ruta Hinds, MD [facility] [place] Office: [phone] Pager: [phone]

## 2017-03-10 NOTE — Progress Notes (Signed)
Vitals:   03/10/17 1550  BP: (!) 157/87  Pulse: 76  Resp: 16  Temp: 98.4 F (36.9 C)  TempSrc: Oral  SpO2: 94%  Weight: 165 lb (74.8 kg)  Height: 5\' 5"  (1.651 m)

## 2017-03-16 ENCOUNTER — Other Ambulatory Visit: Payer: Self-pay

## 2017-03-16 ENCOUNTER — Emergency Department (HOSPITAL_COMMUNITY)
Admission: EM | Admit: 2017-03-16 | Discharge: 2017-03-17 | Disposition: A | Discharge status: Home / Self Care | Payer: Medicaid Other | Attending: Emergency Medicine | Admitting: Emergency Medicine

## 2017-03-16 ENCOUNTER — Encounter (HOSPITAL_COMMUNITY): Payer: Self-pay

## 2017-03-16 ENCOUNTER — Emergency Department (HOSPITAL_COMMUNITY): Payer: Medicaid Other

## 2017-03-16 DIAGNOSIS — K5901 Slow transit constipation: Secondary | ICD-10-CM | POA: Insufficient documentation

## 2017-03-16 DIAGNOSIS — Z79899 Other long term (current) drug therapy: Secondary | ICD-10-CM | POA: Insufficient documentation

## 2017-03-16 DIAGNOSIS — N184 Chronic kidney disease, stage 4 (severe): Secondary | ICD-10-CM | POA: Insufficient documentation

## 2017-03-16 DIAGNOSIS — Z794 Long term (current) use of insulin: Secondary | ICD-10-CM | POA: Insufficient documentation

## 2017-03-16 DIAGNOSIS — I129 Hypertensive chronic kidney disease with stage 1 through stage 4 chronic kidney disease, or unspecified chronic kidney disease: Secondary | ICD-10-CM | POA: Insufficient documentation

## 2017-03-16 DIAGNOSIS — N289 Disorder of kidney and ureter, unspecified: Secondary | ICD-10-CM | POA: Diagnosis not present

## 2017-03-16 DIAGNOSIS — R0602 Shortness of breath: Secondary | ICD-10-CM

## 2017-03-16 DIAGNOSIS — R739 Hyperglycemia, unspecified: Secondary | ICD-10-CM

## 2017-03-16 DIAGNOSIS — Z87891 Personal history of nicotine dependence: Secondary | ICD-10-CM | POA: Insufficient documentation

## 2017-03-16 DIAGNOSIS — J45909 Unspecified asthma, uncomplicated: Secondary | ICD-10-CM | POA: Diagnosis not present

## 2017-03-16 DIAGNOSIS — R1115 Cyclical vomiting syndrome unrelated to migraine: Secondary | ICD-10-CM

## 2017-03-16 DIAGNOSIS — E1165 Type 2 diabetes mellitus with hyperglycemia: Secondary | ICD-10-CM | POA: Diagnosis not present

## 2017-03-16 DIAGNOSIS — R1010 Upper abdominal pain, unspecified: Secondary | ICD-10-CM | POA: Diagnosis present

## 2017-03-16 DIAGNOSIS — I2510 Atherosclerotic heart disease of native coronary artery without angina pectoris: Secondary | ICD-10-CM | POA: Diagnosis not present

## 2017-03-16 DIAGNOSIS — R112 Nausea with vomiting, unspecified: Secondary | ICD-10-CM | POA: Diagnosis not present

## 2017-03-16 DIAGNOSIS — R1084 Generalized abdominal pain: Secondary | ICD-10-CM

## 2017-03-16 LAB — I-STAT BETA HCG BLOOD, ED (MC, WL, AP ONLY)

## 2017-03-16 LAB — COMPREHENSIVE METABOLIC PANEL
ALT: 19 U/L (ref 14–54)
AST: 14 U/L — ABNORMAL LOW (ref 15–41)
Albumin: 2.8 g/dL — ABNORMAL LOW (ref 3.5–5.0)
Alkaline Phosphatase: 160 U/L — ABNORMAL HIGH (ref 38–126)
Anion gap: 8 (ref 5–15)
BILIRUBIN TOTAL: 0.4 mg/dL (ref 0.3–1.2)
BUN: 44 mg/dL — ABNORMAL HIGH (ref 6–20)
CHLORIDE: 108 mmol/L (ref 101–111)
CO2: 21 mmol/L — ABNORMAL LOW (ref 22–32)
CREATININE: 3.72 mg/dL — AB (ref 0.44–1.00)
Calcium: 8.7 mg/dL — ABNORMAL LOW (ref 8.9–10.3)
GFR calc Af Amer: 16 mL/min — ABNORMAL LOW (ref >60)
GFR, EST NON AFRICAN AMERICAN: 14 mL/min — AB (ref >60)
Glucose, Bld: 264 mg/dL — ABNORMAL HIGH (ref 65–99)
Potassium: 5.3 mmol/L — ABNORMAL HIGH (ref 3.5–5.1)
Sodium: 137 mmol/L (ref 135–145)
TOTAL PROTEIN: 6.8 g/dL (ref 6.5–8.1)

## 2017-03-16 LAB — CBC
HCT: 36.5 % (ref 36.0–46.0)
Hemoglobin: 11 g/dL — ABNORMAL LOW (ref 12.0–15.0)
MCH: 24.9 pg — ABNORMAL LOW (ref 26.0–34.0)
MCHC: 30.1 g/dL (ref 30.0–36.0)
MCV: 82.6 fL (ref 78.0–100.0)
PLATELETS: 377 10*3/uL (ref 150–400)
RBC: 4.42 MIL/uL (ref 3.87–5.11)
RDW: 14.3 % (ref 11.5–15.5)
WBC: 10.2 10*3/uL (ref 4.0–10.5)

## 2017-03-16 LAB — I-STAT CG4 LACTIC ACID, ED: LACTIC ACID, VENOUS: 0.64 mmol/L (ref 0.5–1.9)

## 2017-03-16 LAB — LIPASE, BLOOD: LIPASE: 31 U/L (ref 11–51)

## 2017-03-16 MED ORDER — INSULIN ASPART 100 UNIT/ML ~~LOC~~ SOLN
10.0000 [IU] | Freq: Once | SUBCUTANEOUS | Status: AC
  Administered 2017-03-17: 10 [IU] via SUBCUTANEOUS
  Filled 2017-03-16: qty 1

## 2017-03-16 MED ORDER — ONDANSETRON HCL 4 MG/2ML IJ SOLN
4.0000 mg | Freq: Once | INTRAMUSCULAR | Status: AC
  Administered 2017-03-17: 4 mg via INTRAVENOUS
  Filled 2017-03-16: qty 2

## 2017-03-16 MED ORDER — FENTANYL CITRATE (PF) 100 MCG/2ML IJ SOLN
100.0000 ug | Freq: Once | INTRAMUSCULAR | Status: AC
  Administered 2017-03-17: 100 ug via INTRAVENOUS
  Filled 2017-03-16: qty 2

## 2017-03-16 NOTE — ED Triage Notes (Signed)
Pt states that she is having R sided flank pain for a week, hx of kidney failure, has new dialysis graft that is not used yet. Abdomen is very distended. Vomiting more than usual, hx of gastroparesis, and feeling SOB. Last BM 4 days ago.

## 2017-03-17 ENCOUNTER — Emergency Department (HOSPITAL_COMMUNITY): Payer: Medicaid Other

## 2017-03-17 LAB — URINALYSIS, ROUTINE W REFLEX MICROSCOPIC
Bilirubin Urine: NEGATIVE
Hgb urine dipstick: NEGATIVE
Ketones, ur: NEGATIVE mg/dL
Leukocytes, UA: NEGATIVE
NITRITE: NEGATIVE
PH: 6 (ref 5.0–8.0)
Protein, ur: >=300 mg/dL — AB
SPECIFIC GRAVITY, URINE: 1.011 (ref 1.005–1.030)

## 2017-03-17 LAB — I-STAT CHEM 8, ED
BUN: 68 mg/dL — ABNORMAL HIGH (ref 6–20)
CALCIUM ION: 1.15 mmol/L (ref 1.15–1.40)
CREATININE: 3.6 mg/dL — AB (ref 0.44–1.00)
Chloride: 107 mmol/L (ref 101–111)
GLUCOSE: 251 mg/dL — AB (ref 65–99)
HCT: 35 % — ABNORMAL LOW (ref 36.0–46.0)
HEMOGLOBIN: 11.9 g/dL — AB (ref 12.0–15.0)
Potassium: 7.5 mmol/L (ref 3.5–5.1)
SODIUM: 137 mmol/L (ref 135–145)
TCO2: 26 mmol/L (ref 22–32)

## 2017-03-17 LAB — I-STAT CG4 LACTIC ACID, ED: Lactic Acid, Venous: 0.4 mmol/L — ABNORMAL LOW (ref 0.5–1.9)

## 2017-03-17 LAB — CBG MONITORING, ED: GLUCOSE-CAPILLARY: 232 mg/dL — AB (ref 65–99)

## 2017-03-17 MED ORDER — METOCLOPRAMIDE HCL 5 MG/ML IJ SOLN
10.0000 mg | Freq: Once | INTRAMUSCULAR | Status: AC
  Administered 2017-03-17: 10 mg via INTRAVENOUS
  Filled 2017-03-17: qty 2

## 2017-03-17 MED ORDER — FENTANYL CITRATE (PF) 100 MCG/2ML IJ SOLN
50.0000 ug | Freq: Once | INTRAMUSCULAR | Status: AC
  Administered 2017-03-17: 50 ug via INTRAVENOUS
  Filled 2017-03-17: qty 2

## 2017-03-17 NOTE — ED Notes (Signed)
Pt while in CT became very nauseous and started to vomit will notify MD.

## 2017-03-17 NOTE — ED Notes (Signed)
ED Provider at bedside. 

## 2017-03-17 NOTE — Discharge Instructions (Signed)

## 2017-03-17 NOTE — ED Notes (Signed)
Family at bedside. 

## 2017-03-17 NOTE — ED Provider Notes (Signed)
[facility] EMERGENCY DEPARTMENT Provider Note   CSN: [id number] Arrival date & time: 03/16/17  2157     History   Chief Complaint Chief Complaint  Patient presents with  . Flank Pain  . Abdominal Pain    HPI Susan Horton is a 47 y.o. female.  The history is provided by the patient.  Flank Pain  This is a new problem. The current episode started more than 1 week ago. The problem occurs daily. The problem has been gradually worsening. Associated symptoms include abdominal pain. Pertinent negatives include no chest pain. Exacerbated by: certain positions. Nothing relieves the symptoms.  Abdominal Pain   Pertinent negatives include fever and dysuria.  patient With history of anemia, CAD, chronic kidney disease not on dialysis presents with multiple complaints.  She reports for over a week she has had bilateral flank pain as well as abdominal pain and distention.  She has had some nonbloody emesis.  She reports constipation, reports last bowel movement was several days ago  without blood.  No fever.  No chest pain.  She is still making urine.  She has a fistula in her left arm.  Past Medical History:  Diagnosis Date  . Anemia   . Asthma   . CAD (coronary artery disease)    DES to mid LAD July 2018, residual moderate RCA disease  . Cataract   . CKD (chronic kidney disease) stage 4, GFR 15-29 ml/min (HCC)   . DVT (deep venous thrombosis) ([place])    1996 during pregnancy, 2015 left leg  . Essential hypertension 12/11/2015  . Gastroparesis   . GERD (gastroesophageal reflux disease)   . Hidradenitis   . Migraine   . Neuropathy   . Type 2 diabetes mellitus ([place])   . Type II diabetes mellitus [facility])     Patient Active Problem List   Diagnosis Date Noted  . Adjustment disorder with depressed mood 09/08/2016  . Medication management 09/08/2016  . Chest pain 08/29/2016  . CKD (chronic kidney disease) stage 4, GFR 15-29 ml/min (HCC)   . CKD (chronic kidney  disease), stage III: followed by Dr. Jimmy Footman 08/06/2016  . CAD S/P mLAD PCI with DES 08/06/2016  . Presence of drug coated stent in LAD coronary artery 08/06/2016  . Coronary artery disease involving native coronary artery of native heart with angina pectoris ([place]) 08/05/2016  . Abnormal stress test   . Carotid bruit present 06/15/2016  . Dyslipidemia 06/15/2016  . Smoker 06/15/2016  . Neuropathy 03/10/2016  . Essential hypertension, benign 12/11/2015  . Gastroparesis   . Progressive angina ([place]) 06/05/2014  . Intractable nausea and vomiting 06/05/2014  . Uncontrolled type 2 diabetes mellitus with peripheral neuropathy ([place]) 06/05/2014  . Normocytic anemia 06/05/2014  . Hyperglycemia   . Vulvovaginal candidiasis 07/10/2010    Past Surgical History:  Procedure Laterality Date  . ABDOMINOPLASTY    . APPENDECTOMY  1995  . AV FISTULA PLACEMENT Left 02/01/2017   Procedure: ARTERIOVENOUS BRACHIOCEPHALIC (AV) FISTULA CREATION;  Surgeon: [name], MD;  Location: [facility];  Service: Vascular;  Laterality: Left;  . CHOLECYSTECTOMY  1993  . CORONARY ANGIOPLASTY WITH STENT PLACEMENT    . CORONARY STENT INTERVENTION N/A 08/05/2016   Procedure: Coronary Stent Intervention;  Surgeon: [name], MD;  Location: [facility] CV LAB;  Service: Cardiovascular;  Laterality: N/A;  . EXPLORATORY LAPAROTOMY  08/14/2005   lysis of adhesions, drainage of tubo-ovarian abscess  . HYDRADENITIS EXCISION  02/08/2011   Procedure:  EXCISION HYDRADENITIS GROIN;  Surgeon: [name], MD;  Location: [facility];  Service: General;  Laterality: N/A;  Excisioin of Hidradenitis Left groin  . INGUINAL HIDRADENITIS EXCISION  07/06/2010   bilateral  . LEFT HEART CATH AND CORONARY ANGIOGRAPHY N/A 08/05/2016   Procedure: Left Heart Cath and Coronary Angiography;  Surgeon: [name], MD;  Location: [facility] CV LAB;  Service: Cardiovascular;  Laterality: N/A;  . REDUCTION MAMMAPLASTY   2002  . TONSILLECTOMY    . TUBAL LIGATION  1996  . VAGINAL HYSTERECTOMY  08/04/2005   and cysto    OB History    No data available       Home Medications    Prior to Admission medications   Medication Sig Start Date End Date Taking? Authorizing Provider  albuterol (PROVENTIL HFA;VENTOLIN HFA) 108 (90 BASE) MCG/ACT inhaler Inhale 1-2 puffs into the lungs every 6 (six) hours as needed for wheezing or shortness of breath.    Yes [provider]  amLODipine (NORVASC) 10 MG tablet Take 1 tablet (10 mg total) by mouth daily. 01/21/16  Yes [name], MD  aspirin 81 MG chewable tablet Chew 1 tablet (81 mg total) by mouth daily. 08/07/16  Yes Cheryln Manly, NP  Calcifediol ER (RAYALDEE) 30 MCG CPCR Take 30 mcg by mouth daily.    Yes [provider]  calcitRIOL (ROCALTROL) 0.25 MCG capsule Take 0.25 mcg by mouth daily. 05/26/16  Yes [provider]  Cholecalciferol (VITAMIN D PO) Take 1 capsule by mouth daily.   Yes [provider]  clopidogrel (PLAVIX) 75 MG tablet Take 1 tablet (75 mg total) by mouth daily with breakfast. 08/07/16  Yes Cheryln Manly, NP  cyclobenzaprine (FLEXERIL) 5 MG tablet Take 1 tablet (5 mg total) by mouth at bedtime as needed for muscle spasms. 01/05/17  Yes Patel, Donika K, DO  DULoxetine (CYMBALTA) 60 MG capsule Take 1 capsule (60 mg total) by mouth daily. 12/08/16  Yes [name], MD  Ferrous Sulfate (IRON) 325 (65 Fe) MG TABS Take 325 mg by mouth daily. 11/25/15  Yes Noel, Tiffany S, PA-C  Fluticasone-Salmeterol (ADVAIR) 250-50 MCG/DOSE AEPB Inhale 1 puff into the lungs 2 (two) times daily. Patient taking differently: Inhale 1 puff into the lungs 2 (two) times daily as needed (shortness of breath).  08/30/16  Yes Vann, Jessica U, DO  gabapentin (NEURONTIN) 300 MG capsule Take 2 capsules (600 mg total) by mouth 3 (three) times daily. 03/10/16  Yes [name], MD  Insulin Glargine (TOUJEO SOLOSTAR)  300 UNIT/ML SOPN Inject 60 Units 2 (two) times daily into the skin. 11/22/16  Yes Philemon Kingdom, MD  insulin regular (HUMULIN R) 100 units/mL injection Inject 20-30 into the skin 3 (three) times daily before meals. Sliding scale 02/22/17  Yes Philemon Kingdom, MD  losartan (COZAAR) 100 MG tablet Take 1 tablet (100 mg total) by mouth daily. 01/21/16  Yes Jegede, Marlena Clipper, MD  lovastatin (MEVACOR) 20 MG tablet Take 20 mg by mouth daily.   Yes [provider]  metoCLOPramide (REGLAN) 5 MG tablet Take 1 tablet (5 mg total) by mouth 4 (four) times daily -  before meals and at bedtime. 01/21/16  Yes [name], MD  metoprolol succinate (TOPROL-XL) 100 MG 24 hr tablet Take 1 tablet (100 mg total) by mouth daily. Take with or immediately following a meal. 10/18/16  Yes Jegede, Olugbemiga E, MD  mirtazapine (REMERON SOL-TAB) 15 MG disintegrating tablet  TAKE 1 TABLET BY MOUTH AT BEDTIME. Patient taking differently: TAKE 15 MG BY MOUTH AT BEDTIME. 12/08/16  Yes Jegede, Olugbemiga E, MD  montelukast (SINGULAIR) 10 MG tablet Take 10 mg by mouth daily as needed (allergies).    Yes [provider]  nitroGLYCERIN (NITROSTAT) 0.4 MG SL tablet Place 1 tablet (0.4 mg total) under the tongue every 5 (five) minutes as needed. Patient taking differently: Place 0.4 mg under the tongue every 5 (five) minutes as needed for chest pain.  08/06/16  Yes Cheryln Manly, NP  nortriptyline (PAMELOR) 10 MG capsule Take 3 tablet at bedtime x 1 month, then increase to 4 tablet at bedtime. Patient taking differently: Take 20 mg by mouth at bedtime.  01/05/17  Yes Patel, Donika K, DO  ondansetron (ZOFRAN) 4 MG tablet Take 1 tablet (4 mg total) by mouth daily as needed for nausea or vomiting. 08/30/16 08/30/17 Yes Vann, Jessica U, DO  pantoprazole (PROTONIX) 40 MG tablet Take 1 tablet (40 mg total) by mouth daily. 08/07/16  Yes Cheryln Manly, NP  senna-docusate (SENOKOT-S) 8.6-50 MG tablet Take 1  tablet by mouth 2 (two) times daily. Patient taking differently: Take 1 tablet by mouth 2 (two) times daily as needed for mild constipation.  08/30/16  Yes Geradine Girt, DO  Capsaicin-Menthol-Methyl Sal (CAPSAICIN-METHYL SAL-MENTHOL) 0.025-1-12 % CREA Apply 1 application topically 4 (four) times daily as needed. For aches/pains, apply to skin. May need gloves. Patient not taking: Reported on 03/17/2017 02/24/16   Maren Reamer, MD  oxyCODONE-acetaminophen (PERCOCET) 5-325 MG tablet Take 1 tablet by mouth every 6 (six) hours as needed for severe pain. Patient not taking: Reported on 03/10/2017 02/01/17   Gabriel Earing, PA-C    Family History Family History  Problem Relation Age of Onset  . Hypertension Father   . Hypertension Sister   . Hypertension Brother   . Breast cancer Cousin        x2    Social History Social History   Tobacco Use  . Smoking status: Former Smoker    Years: 20.00    Types: Cigarettes    Last attempt to quit: 11/2016    Years since quitting: 0.3  . Smokeless tobacco: Never Used  . Tobacco comment: Smokes on rare occasions  Substance Use Topics  . Alcohol use: No    Alcohol/week: 0.0 oz  . Drug use: No     Allergies   Penicillins and Lisinopril   Review of Systems Review of Systems  Constitutional: Positive for fatigue. Negative for fever.  Cardiovascular: Negative for chest pain.  Gastrointestinal: Positive for abdominal pain.  Genitourinary: Positive for flank pain. Negative for dysuria.  All other systems reviewed and are negative.    Physical Exam Updated Vital Signs BP (!) 161/77   Pulse 98   Temp 98.4 F (36.9 C) (Oral)   Resp 15   SpO2 90%   Physical Exam CONSTITUTIONAL: Well developed/well nourished, chronically ill-appearing HEAD: Normocephalic/atraumatic EYES: EOMI ENMT: Mucous membranes moist NECK: supple no meningeal signs SPINE/BACK:entire spine nontender CV: S1/S2 noted, no murmurs/rubs/gallops noted LUNGS:  Lungs are clear to auscultation bilaterally, no apparent distress ABDOMEN: soft, mild diffuse tenderness, distended, no rebound or guarding, bowel sounds noted throughout abdomen GU: Bilateral Cva tenderness NEURO: Pt is awake/alert/appropriate, moves all extremitiesx4.  No facial droop.   EXTREMITIES: pulses normal/equal, full ROM, fistula to left arm with thrill SKIN: warm, color normal PSYCH: no abnormalities of mood noted, alert and oriented to situation  ED Treatments / Results  Labs (all labs ordered are listed, but only abnormal results are displayed) Labs Reviewed  COMPREHENSIVE METABOLIC PANEL - Abnormal; Notable for the following components:      Result Value   Potassium 5.3 (*)    CO2 21 (*)    Glucose, Bld 264 (*)    BUN 44 (*)    Creatinine, Ser 3.72 (*)    Calcium 8.7 (*)    Albumin 2.8 (*)    AST 14 (*)    Alkaline Phosphatase 160 (*)    GFR calc non Af Amer 14 (*)    GFR calc Af Amer 16 (*)    All other components within normal limits  CBC - Abnormal; Notable for the following components:   Hemoglobin 11.0 (*)    MCH 24.9 (*)    All other components within normal limits  URINALYSIS, ROUTINE W REFLEX MICROSCOPIC - Abnormal; Notable for the following components:   Glucose, UA >=500 (*)    Protein, ur >=300 (*)    Bacteria, UA FEW (*)    Squamous Epithelial / LPF 0-5 (*)    All other components within normal limits  I-STAT CHEM 8, ED - Abnormal; Notable for the following components:   Potassium 7.5 (*)    BUN 68 (*)    Creatinine, Ser 3.60 (*)    Glucose, Bld 251 (*)    Hemoglobin 11.9 (*)    HCT 35.0 (*)    All other components within normal limits  I-STAT CG4 LACTIC ACID, ED - Abnormal; Notable for the following components:   Lactic Acid, Venous 0.40 (*)    All other components within normal limits  CBG MONITORING, ED - Abnormal; Notable for the following components:   Glucose-Capillary 232 (*)    All other components within normal limits  LIPASE,  BLOOD  I-STAT BETA HCG BLOOD, ED ([facility], [facility], [facility] ONLY)  I-STAT CG4 LACTIC ACID, ED    EKG  EKG Interpretation  Date/Time:  Wednesday March 16 2017 22:13:11 EST Ventricular Rate:  104 PR Interval:  160 QRS Duration: 78 QT Interval:  330 QTC Calculation: 433 R Axis:   66 Text Interpretation:  Sinus tachycardia Otherwise normal ECG No significant change since last tracing Confirmed by Ripley Fraise [phone]) on 03/16/2017 11:31:36 PM       Radiology Dg Chest 2 View  Result Date: 03/16/2017 CLINICAL DATA:  Shortness of breath EXAM: CHEST - 2 VIEW COMPARISON:  08/29/2016 FINDINGS: Low lung volumes. Mild left lung base infiltrate or atelectasis. No pleural effusion. Normal heart size. No pneumothorax. IMPRESSION: Low lung volumes with streaky left lung base atelectasis or infiltrate. Electronically Signed   By: Donavan Foil M.D.   On: 03/16/2017 22:40   Dg Abd 2 Views  Result Date: 03/16/2017 CLINICAL DATA:  Bloating and abdominal pain EXAM: ABDOMEN - 2 VIEW COMPARISON:  08/30/2016 FINDINGS: Surgical clips in the right upper quadrant. Nonobstructed gas pattern with moderate stool in the colon. No free air beneath the diaphragm. No abnormal calcification IMPRESSION: Nonobstructed gas pattern with moderate stool in the colon Electronically Signed   By: Donavan Foil M.D.   On: 03/16/2017 22:40   Ct Renal Stone Study  Result Date: 03/17/2017 CLINICAL DATA:  Flank pain bloating and vomiting EXAM: CT ABDOMEN AND PELVIS WITHOUT CONTRAST TECHNIQUE: Multidetector CT imaging of the abdomen and pelvis was performed following the standard protocol without IV contrast. COMPARISON:  03/16/2017, 11/20/2014, 07/26/2014 FINDINGS: Lower chest: No acute abnormality. Hepatobiliary: Status  post cholecystectomy. No focal hepatic abnormality. Stable prominence of extrahepatic bile duct felt secondary to surgical change. Pancreas: Unremarkable. No pancreatic ductal dilatation or surrounding inflammatory changes. Spleen:  Normal in size without focal abnormality. Adrenals/Urinary Tract: Adrenal glands are unremarkable. Kidneys are normal, without renal calculi, focal lesion, or hydronephrosis. Bladder is unremarkable. Stomach/Bowel: Stomach is within normal limits. Status post appendectomy. No evidence of bowel wall thickening, distention, or inflammatory changes. Vascular/Lymphatic: Moderate aortic atherosclerosis. No aneurysmal dilatation. Reproductive: Status post hysterectomy. Air in the vagina. No adnexal mass. Other: Negative for free air or free fluid. Small supraumbilical ventral hernia containing fat. Musculoskeletal: No acute or significant osseous findings. IMPRESSION: 1. Negative for nephrolithiasis, hydronephrosis, or ureteral stone 2. No definite CT evidence for acute intra-abdominal or pelvic abnormality. Electronically Signed   By: Donavan Foil M.D.   On: 03/17/2017 01:52    Procedures Procedures (including critical care time)  Medications Ordered in ED Medications  ondansetron (ZOFRAN) injection 4 mg (4 mg Intravenous Given 03/17/17 0025)  fentaNYL (SUBLIMAZE) injection 100 mcg (100 mcg Intravenous Given 03/17/17 0022)  insulin aspart (novoLOG) injection 10 Units (10 Units Subcutaneous Given 03/17/17 0025)  metoCLOPramide (REGLAN) injection 10 mg (10 mg Intravenous Given 03/17/17 0149)  fentaNYL (SUBLIMAZE) injection 50 mcg (50 mcg Intravenous Given 03/17/17 0158)     Initial Impression / Assessment and Plan / ED Course  I have reviewed the triage vital signs and the nursing notes.  Pertinent labs & imaging results that were available during my care of the patient were reviewed by me and considered in my medical decision making (see chart for details).     1:36 AM Patient with multiple medical conditions presents with diffuse abdominal distention/pain and flank pain.  CT imaging ordered.  Repeat potassium is 5.3, no EKG changes.  Insulin is been given 3:28 AM Patient improved.  CT imaging negative.   She is taking p.o. fluids.  Overall appears improved.  Suspect the vomiting was due to gastroparesis. Advised to stay hydrated, be sure to take insulin and take her reglan   Final Clinical Impressions(s) / ED Diagnoses   Final diagnoses:  Generalized abdominal pain  Slow transit constipation  Renal insufficiency  Hyperglycemia  Non-intractable cyclical vomiting with nausea    ED Discharge Orders    None       Ripley Fraise, MD 03/17/17 [phone]

## 2017-03-18 ENCOUNTER — Other Ambulatory Visit: Payer: Self-pay | Admitting: Cardiology

## 2017-03-18 ENCOUNTER — Telehealth: Payer: Self-pay | Admitting: Cardiovascular Disease

## 2017-03-18 ENCOUNTER — Other Ambulatory Visit: Payer: Self-pay | Admitting: Internal Medicine

## 2017-03-18 DIAGNOSIS — B373 Candidiasis of vulva and vagina: Secondary | ICD-10-CM

## 2017-03-18 DIAGNOSIS — E1143 Type 2 diabetes mellitus with diabetic autonomic (poly)neuropathy: Secondary | ICD-10-CM

## 2017-03-18 DIAGNOSIS — E1165 Type 2 diabetes mellitus with hyperglycemia: Secondary | ICD-10-CM

## 2017-03-18 DIAGNOSIS — I10 Essential (primary) hypertension: Secondary | ICD-10-CM

## 2017-03-18 DIAGNOSIS — IMO0002 Reserved for concepts with insufficient information to code with codable children: Secondary | ICD-10-CM

## 2017-03-18 DIAGNOSIS — K3184 Gastroparesis: Secondary | ICD-10-CM

## 2017-03-18 DIAGNOSIS — B3731 Acute candidiasis of vulva and vagina: Secondary | ICD-10-CM

## 2017-03-18 DIAGNOSIS — E1142 Type 2 diabetes mellitus with diabetic polyneuropathy: Secondary | ICD-10-CM

## 2017-03-18 MED FILL — FUROSEMIDE 40 MG TAB: 40 | 30 days supply | Qty: 30 | Fill #0

## 2017-03-18 NOTE — Telephone Encounter (Signed)
New Message    Pt c/o of Chest Pain: STAT if CP now or developed within 24 hours  1. Are you having CP right now?  no  2. Are you experiencing any other symptoms (ex. SOB, nausea, vomiting, sweating)? Sob and chest pains that come and go , abdomin is really swollen   3. How long have you been experiencing CP? 2 weeks   4. Is your CP continuous or coming and going? Comes and goes  5. Have you taken Nitroglycerin? No  , takes aspirin    Pt c/o swelling: STAT is pt has developed SOB within 24 hours  1) How much weight have you gained and in what time span? 1-2 lbs in 2 weeks   2) If swelling, where is the swelling located? abdomin  3) Are you currently taking a fluid pill? No- her pcp is calling her one in today  4) Are you currently SOB? Yes   5) Do you have a log of your daily weights (if so, list)? no   6) Have you gained 3 pounds in a day or 5 pounds in a week? no  7) Have you traveled recently? no  ?

## 2017-03-18 NOTE — Telephone Encounter (Signed)
Returned the call to the patient. She was discharge from the ED yesterday with Flank and Abdominal pain. She stated that she still has shortness of breath and that her abdomen is distended. She saw her nephrologist today who prescribed her Furosemide 40 mg daily. She will start taking that today.  She stated that she is also been having some chest pressure but feels like this could be related to her distended abdomen which she states is "really bad." She stated that when she sits up the pressure in her chest feels better but worsens when she lies down. She has been advised to go to the ED if this does not get better. She verbalized her understanding. An appointment has been made for 3/12 with an APP.

## 2017-03-22 ENCOUNTER — Other Ambulatory Visit (HOSPITAL_COMMUNITY): Payer: Self-pay | Admitting: *Deleted

## 2017-03-22 ENCOUNTER — Ambulatory Visit (INDEPENDENT_AMBULATORY_CARE_PROVIDER_SITE_OTHER): Payer: Medicaid Other | Admitting: Physician Assistant

## 2017-03-22 ENCOUNTER — Encounter: Payer: Self-pay | Admitting: Physician Assistant

## 2017-03-22 VITALS — BP 138/80 | HR 99 | Ht 65.0 in | Wt 164.0 lb

## 2017-03-22 DIAGNOSIS — R079 Chest pain, unspecified: Secondary | ICD-10-CM | POA: Diagnosis not present

## 2017-03-22 DIAGNOSIS — N184 Chronic kidney disease, stage 4 (severe): Secondary | ICD-10-CM | POA: Diagnosis not present

## 2017-03-22 DIAGNOSIS — Z794 Long term (current) use of insulin: Secondary | ICD-10-CM

## 2017-03-22 DIAGNOSIS — I2510 Atherosclerotic heart disease of native coronary artery without angina pectoris: Secondary | ICD-10-CM

## 2017-03-22 DIAGNOSIS — E119 Type 2 diabetes mellitus without complications: Secondary | ICD-10-CM

## 2017-03-22 MED ORDER — METOPROLOL SUCCINATE ER 100 MG PO TB24: ORAL_TABLET | ORAL | 6 refills | Status: DC

## 2017-03-22 NOTE — Progress Notes (Signed)
Cardiology Office Note    Date:  03/23/2017   ID:  Susan Horton, DOB 05-May-1970, MRN [id number]  PCP:  [name], MD  Cardiologist:  Dr. Gwenlyn Found  Nephrologist: [place]  Chief Complaint  Patient presents with  . Shortness of Breath    on and off for 2 weeks.[name]    History of Present Illness:  PAISELY BRICK is a 47 y.o. female with PMH of CAD, h/o DVT, CKD stage IV, DM II, and GERD.  Last echocardiogram obtained on 07/27/2016 showed EF 60-65%, moderate LVH, grade 1 DD. previous Myoview in 2018 was intermediate risk with inferior ischemia.  Outpatient cardiac catheterization performed on 08/05/2016 revealed 80% mid LAD lesion treated with DES, moderate mid RCA lesion, and left main spasm responded to intracoronary nitroglycerin.  Carotid Doppler in 2018 showed a mild to moderate left ICA stenosis.  During the last office visit on 08/13/2016, her metoprolol was increased to 75 mg twice daily.  Since last office visit, patient was admitted on 08/29/2016 with chest pain.  Her chest pain was felt to be very atypical and likely related to gastroparesis, therefore no further testing was recommended.  She underwent a left brachiocephalic AV fistula placement by Dr. [name] on 02/01/2017.  She was most recently seen in the ED again on 03/17/2017 for abdominal pain.  CT of abdomen negative for kidney stone, hydronephrosis or ureteral stone.  No evidence of for acute intra-abdominal or pelvic abnormality.  She presents today for cardiology office visit.  She complained of chest pain, orthopnea, PND, and abdominal distention.  This has been going on for more than a week.  She was recently seen by Dr. Jimmy Footman and was started on Lasix 40 mg daily in the past 3 days.  Since then, her breathing is getting better, her abdominal distention is also improving as well.  On today's physical exam, her abdomen is still enlarged.  She does not have any lower extremity edema, her lung is clear.  I would recommend her to  continue on the current dose of diuretic.  She has also been having some chest pain, it has some atypical features such as worsening symptom with deep inspiration.  It also radiate down the left arm which she says is similar to what she experienced prior to her previous stent placement.  She is not a very good candidate for repeat cardiac catheterization at this point given her renal function as she has not started on dialysis yet, I will repeat a Lexiscan Myoview.  Her heart rate is somewhat tachycardic today, I increased her metoprolol succinate to 50 mg a.m. and 100 mg p.m.  Otherwise she can follow-up with Dr. Alvester Chou in 3 months.   Past Medical History:  Diagnosis Date  . Anemia   . Asthma   . CAD (coronary artery disease)    DES to mid LAD July 2018, residual moderate RCA disease  . Cataract   . CKD (chronic kidney disease) stage 4, GFR 15-29 ml/min (HCC)   . DVT (deep venous thrombosis) ([place])    1996 during pregnancy, 2015 left leg  . Essential hypertension 12/11/2015  . Gastroparesis   . GERD (gastroesophageal reflux disease)   . Hidradenitis   . Migraine   . Neuropathy   . Type 2 diabetes mellitus ([place])   . Type II diabetes mellitus ([place])     Past Surgical History:  Procedure Laterality Date  . ABDOMINOPLASTY    . APPENDECTOMY  1995  . AV  FISTULA PLACEMENT Left 02/01/2017   Procedure: ARTERIOVENOUS BRACHIOCEPHALIC (AV) FISTULA CREATION;  Surgeon: [name], MD;  Location: [facility];  Service: Vascular;  Laterality: Left;  . CHOLECYSTECTOMY  1993  . CORONARY ANGIOPLASTY WITH STENT PLACEMENT    . CORONARY STENT INTERVENTION N/A 08/05/2016   Procedure: Coronary Stent Intervention;  Surgeon: [name], MD;  Location: [facility];  Service: Cardiovascular;  Laterality: N/A;  . EXPLORATORY LAPAROTOMY  08/14/2005   lysis of adhesions, drainage of tubo-ovarian abscess  . HYDRADENITIS EXCISION  02/08/2011   Procedure: EXCISION HYDRADENITIS GROIN;  Surgeon: [name], MD;  Location: [facility];  Service: General;  Laterality: N/A;  Excisioin of Hidradenitis Left groin  . INGUINAL HIDRADENITIS EXCISION  07/06/2010   bilateral  . LEFT HEART CATH AND CORONARY ANGIOGRAPHY N/A 08/05/2016   Procedure: Left Heart Cath and Coronary Angiography;  Surgeon: [name], MD;  Location: [facility];  Service: Cardiovascular;  Laterality: N/A;  . REDUCTION MAMMAPLASTY  2002  . TONSILLECTOMY    . TUBAL LIGATION  1996  . VAGINAL HYSTERECTOMY  08/04/2005   and cysto    Current Medications: Outpatient Medications Prior to Visit  Medication Sig Dispense Refill  . albuterol (PROVENTIL HFA;VENTOLIN HFA) 108 (90 BASE) MCG/ACT inhaler Inhale 1-2 puffs into the lungs every 6 (six) hours as needed for wheezing or shortness of breath.     [name] amLODipine (NORVASC) 10 MG tablet TAKE 1 TABLET BY MOUTH DAILY 30 tablet 0  . aspirin 81 MG chewable tablet Chew 1 tablet (81 mg total) by mouth daily. 30 tablet 0  . Calcifediol ER (RAYALDEE) 30 MCG CPCR Take 30 mcg by mouth daily.     . calcitRIOL (ROCALTROL) 0.25 MCG capsule Take 0.25 mcg by mouth daily.  6  . Cholecalciferol (VITAMIN D PO) Take 1 capsule by mouth daily.    . clopidogrel (PLAVIX) 75 MG tablet Take 1 tablet (75 mg total) by mouth daily with breakfast. 90 tablet 2  . cyclobenzaprine (FLEXERIL) 5 MG tablet Take 1 tablet (5 mg total) by mouth at bedtime as needed for muscle spasms. 30 tablet 3  . DULoxetine (CYMBALTA) 60 MG capsule Take 1 capsule (60 mg total) by mouth daily. 30 capsule 3  . Ferrous Sulfate (IRON) 325 (65 Fe) MG TABS Take 325 mg by mouth daily. 30 each 3  . Fluticasone-Salmeterol (ADVAIR) 250-50 MCG/DOSE AEPB Inhale 1 puff into the lungs 2 (two) times daily. (Patient taking differently: Inhale 1 puff into the lungs 2 (two) times daily as needed (shortness of breath). ) 60 each   . furosemide (LASIX) 40 MG tablet Take 40 mg by mouth daily.  6  . gabapentin (NEURONTIN) 300 MG  capsule TAKE 2 CAPSULES BY MOUTH 3 TIMES DAILY. 180 capsule 0  . Insulin Glargine (TOUJEO SOLOSTAR) 300 UNIT/ML SOPN Inject 60 Units 2 (two) times daily into the skin. 12 pen 5  . insulin regular (HUMULIN R) 100 units/mL injection Inject 20-30 into the skin 3 (three) times daily before meals. Sliding scale 30 mL 5  . losartan (COZAAR) 100 MG tablet TAKE 1 TABLET BY MOUTH DAILY 30 tablet 0  . lovastatin (MEVACOR) 20 MG tablet Take 20 mg by mouth daily.    . metoCLOPramide (REGLAN) 5 MG tablet TAKE 1 TABLET BY MOUTH 4 TIMES DAILY BEFORE MEALS AND AT BEDTIME. 90 tablet 0  . mirtazapine (REMERON SOL-TAB) 15 MG disintegrating tablet TAKE 1 TABLET BY MOUTH AT  BEDTIME. 30 tablet 0  . montelukast (SINGULAIR) 10 MG tablet Take 10 mg by mouth daily as needed (allergies).     . nitroGLYCERIN (NITROSTAT) 0.4 MG SL tablet Place 1 tablet (0.4 mg total) under the tongue every 5 (five) minutes as needed. (Patient taking differently: Place 0.4 mg under the tongue every 5 (five) minutes as needed for chest pain. ) 25 tablet 3  . nortriptyline (PAMELOR) 10 MG capsule Take 3 tablet at bedtime x 1 month, then increase to 4 tablet at bedtime. (Patient taking differently: Take 20 mg by mouth at bedtime. ) 120 capsule 5  . ondansetron (ZOFRAN) 4 MG tablet Take 1 tablet (4 mg total) by mouth daily as needed for nausea or vomiting. 30 tablet 0  . oxyCODONE-acetaminophen (PERCOCET) 5-325 MG tablet Take 1 tablet by mouth every 6 (six) hours as needed for severe pain. 8 tablet 0  . pantoprazole (PROTONIX) 40 MG tablet TAKE 1 TABLET BY MOUTH DAILY. 30 tablet 2  . senna-docusate (SENOKOT-S) 8.6-50 MG tablet Take 1 tablet by mouth 2 (two) times daily. (Patient taking differently: Take 1 tablet by mouth 2 (two) times daily as needed for mild constipation. )    . metoprolol succinate (TOPROL-XL) 100 MG 24 hr tablet Take 1 tablet (100 mg total) by mouth daily. Take with or immediately following a meal. 30 tablet 6  .  Capsaicin-Menthol-Methyl Sal (CAPSAICIN-METHYL SAL-MENTHOL) 0.025-1-12 % CREA Apply 1 application topically 4 (four) times daily as needed. For aches/pains, apply to skin. May need gloves. (Patient not taking: Reported on 03/17/2017) 1 Tube 3   No facility-administered medications prior to visit.      Allergies:   Penicillins and Lisinopril   Social History   Socioeconomic History  . Marital status: Single    Spouse name: None  . Number of children: 3  . Years of education: 63  . Highest education level: None  Social Needs  . Financial resource strain: None  . Food insecurity - worry: None  . Food insecurity - inability: None  . Transportation needs - medical: None  . Transportation needs - non-medical: None  Occupational History  . Occupation: Curator  Tobacco Use  . Smoking status: Former Smoker    Years: 20.00    Types: Cigarettes    Last attempt to quit: 11/2016    Years since quitting: 0.3  . Smokeless tobacco: Never Used  . Tobacco comment: Smokes on rare occasions  Substance and Sexual Activity  . Alcohol use: No    Alcohol/week: 0.0 oz  . Drug use: No  . Sexual activity: None  Other Topics Concern  . None  Social History Narrative   Lives with daughter in a 2 story home.  Has 3 children.  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  Education: college.      Family History:  The patient's family history includes Breast cancer in her cousin; Hypertension in her brother, father, and sister.   ROS:   Please see the history of present illness.    ROS All other systems reviewed and are negative.   PHYSICAL EXAM:   VS:  BP 138/80   Pulse 99   Ht 5\' 5"  (1.651 m)   Wt 164 lb (74.4 kg)   BMI 27.29 kg/m    GEN: Well nourished, well developed, in no acute distress  HEENT: normal  Neck: no JVD, carotid bruits, or masses Cardiac: RRR; no murmurs, rubs, or gallops,no edema  Respiratory:  clear to auscultation bilaterally, normal work of  breathing GI: soft, nontender, nondistended, + BS MS: no deformity or atrophy  Skin: warm and dry, no rash Neuro:  Alert and Oriented x 3, Strength and sensation are intact Psych: euthymic mood, full affect  Wt Readings from Last 3 Encounters:  03/22/17 164 lb (74.4 kg)  03/10/17 165 lb (74.8 kg)  02/22/17 166 lb 9.6 oz (75.6 kg)      Studies/Labs Reviewed:   EKG:  EKG is ordered today.  The ekg ordered today demonstrates normal sinus rhythm with T wave inversion in lead I and aVL.  Recent Labs: 07/06/2016: B Natriuretic Peptide 29.5 07/26/2016: TSH 1.930 03/16/2017: ALT 19; BUN 68; Creatinine, Ser 3.60; Hemoglobin 11.9; Platelets 377; Potassium 7.5; Sodium 137   Lipid Panel    Component Value Date/Time   CHOL 239 (H) 01/01/2010 2055   TRIG 149 01/01/2010 2055   HDL 33 (L) 01/01/2010 2055   CHOLHDL 7.2 Ratio 01/01/2010 2055   VLDL 30 01/01/2010 2055   LDLCALC 176 (H) 01/01/2010 2055    Additional studies/ records that were reviewed today include:   Echo 07/27/2016 LV EF: 60% -   65%  Study Conclusions  - Left ventricle: The cavity size was normal. There was moderate   concentric hypertrophy. Systolic function was normal. The   estimated ejection fraction was in the range of 60% to 65%. Wall   motion was normal; there were no regional wall motion   abnormalities. Doppler parameters are consistent with abnormal   left ventricular relaxation (grade 1 diastolic dysfunction).   There was no evidence of elevated ventricular filling pressure by   Doppler parameters. - Aortic valve: There was trivial regurgitation. - Aortic root: The aortic root was normal in size. - Mitral valve: Valve area by pressure half-time: 2.32 cm^2. - Left atrium: The atrium was normal in size. - Right ventricle: Systolic function was normal. - Pulmonic valve: There was mild regurgitation. - Pulmonary arteries: Systolic pressure was within the normal   range. - Inferior vena cava: The vessel was  normal in size. The   respirophasic diameter changes were in the normal range (= 50%),   consistent with normal central venous pressure.  Impressions:  - Normal LVEF, moderate concentric LVH, findings suspicious for a   hypertensive heart disease.    Cath 08/05/2016 Conclusion     Prox RCA lesion, 50 %stenosed.  Mid RCA lesion, 60 %stenosed.  Mid LAD lesion, 80 %stenosed.  Post intervention, there is a 0% residual stenosis.  A stent was successfully placed.      ASSESSMENT:    1. Chest pain, unspecified type   2. Coronary artery disease involving native coronary artery of native heart without angina pectoris   3. CKD (chronic kidney disease), stage IV ([place])   4. Controlled type 2 diabetes mellitus without complication, with long-term current use of insulin (HCC)      PLAN:  In order of problems listed above:  1. Chest pain: Although patient says this is similar to what she has experienced prior to the previous stent placement, it does have some atypical features such as worsening symptom with deep inspiration.  I recommended outpatient Myoview  2. Chronic diastolic heart failure: Her recent presentation is concerning for volume overload, she no longer has any lower extremity edema, her abdomen is somewhat distended however according to the patient this has improved after 3 days of diuresis.  She was placed on 40 mg daily of Lasix by  her nephrologist.  I will continue on the current dose of diuretic and will defer to Dr. Jimmy Footman to follow-up on the lab work.  3. CAD: Continue aspirin and Plavix, last PCI July 2018  4. CKD stage IV: Has not started on dialysis yet.  AV fistula placed by Dr. [name] in January 2019  5. DM 2: Managed by primary care provider    Medication Adjustments/Labs and Tests Ordered: Current medicines are reviewed at length with the patient today.  Concerns regarding medicines are outlined above.  Medication changes, Labs and Tests ordered  today are listed in the Patient Instructions below. Patient Instructions  Medication Instructions:  INCREASE to Metoprolol Succinate 50mg  in the morning and 100mg  in the pm  Labwork: None   Testing/Procedures: Your physician has requested that you have a lexiscan myoview. For further information please visit [email or web address]. Please follow instruction sheet, as given. [facility]  Follow-Up: Your physician recommends that you schedule a follow-up appointment in: 3 months with Dr Gwenlyn Found  Any Other Special Instructions Will Be Listed Below (If Applicable).  If you need a refill on your cardiac medications before your next appointment, please call your pharmacy.     Hilbert Corrigan, [place]  03/23/2017 6:27 AM    [address], [place], [place]  [address] Phone: [phone]; Fax: [phone]

## 2017-03-22 NOTE — Patient Instructions (Addendum)
Medication Instructions:  INCREASE to Metoprolol Succinate 50mg  in the morning and 100mg  in the pm  Labwork: None   Testing/Procedures: Your physician has requested that you have a lexiscan myoview. For further information please visit [email or web address]. Please follow instruction sheet, as given. [facility]  Follow-Up: Your physician recommends that you schedule a follow-up appointment in: 3 months with Dr Gwenlyn Found  Any Other Special Instructions Will Be Listed Below (If Applicable).  If you need a refill on your cardiac medications before your next appointment, please call your pharmacy.

## 2017-03-23 ENCOUNTER — Encounter (HOSPITAL_COMMUNITY): Payer: Medicaid Other

## 2017-03-23 ENCOUNTER — Ambulatory Visit (HOSPITAL_COMMUNITY)
Admission: RE | Admit: 2017-03-23 | Discharge: 2017-03-23 | Disposition: A | Discharge status: Home / Self Care | Payer: Medicaid Other | Source: Ambulatory Visit | Attending: Nephrology | Admitting: Nephrology

## 2017-03-23 ENCOUNTER — Other Ambulatory Visit: Payer: Self-pay | Admitting: Physician Assistant

## 2017-03-23 ENCOUNTER — Encounter: Payer: Self-pay | Admitting: Physician Assistant

## 2017-03-23 VITALS — BP 165/70 | HR 102 | Temp 98.6°F | Resp 18

## 2017-03-23 DIAGNOSIS — D631 Anemia in chronic kidney disease: Secondary | ICD-10-CM | POA: Diagnosis present

## 2017-03-23 DIAGNOSIS — N184 Chronic kidney disease, stage 4 (severe): Secondary | ICD-10-CM | POA: Insufficient documentation

## 2017-03-23 LAB — POCT HEMOGLOBIN-HEMACUE: HEMOGLOBIN: 11.9 g/dL — AB (ref 12.0–15.0)

## 2017-03-23 MED ORDER — EPOETIN ALFA 20000 UNIT/ML IJ SOLN
INTRAMUSCULAR | Status: AC
  Administered 2017-03-23: 20000 [IU] via SUBCUTANEOUS
  Filled 2017-03-23: qty 1

## 2017-03-23 MED ORDER — EPOETIN ALFA 20000 UNIT/ML IJ SOLN
20000.0000 [IU] | INTRAMUSCULAR | Status: DC
  Administered 2017-03-23: 20000 [IU] via SUBCUTANEOUS

## 2017-03-23 MED ORDER — CLOPIDOGREL BISULFATE 75 MG PO TABS: 75.0000 mg | ORAL_TABLET | Freq: Every day | ORAL | 2 refills | Status: DC

## 2017-03-23 MED ORDER — SODIUM CHLORIDE 0.9 % IV SOLN
510.0000 mg | Freq: Once | INTRAVENOUS | Status: AC
  Administered 2017-03-23: 510 mg via INTRAVENOUS
  Filled 2017-03-23: qty 17

## 2017-03-24 ENCOUNTER — Telehealth (HOSPITAL_COMMUNITY): Payer: Self-pay

## 2017-03-24 NOTE — Telephone Encounter (Signed)
Encounter complete. 

## 2017-03-25 ENCOUNTER — Telehealth: Payer: Self-pay | Admitting: Internal Medicine

## 2017-03-25 NOTE — Telephone Encounter (Signed)
6 page, paperwork received through fax 03-25-17. (Chart notes)

## 2017-03-29 ENCOUNTER — Ambulatory Visit (HOSPITAL_COMMUNITY)
Admission: RE | Admit: 2017-03-29 | Discharge: 2017-03-29 | Disposition: A | Discharge status: Home / Self Care | Payer: Medicaid Other | Source: Ambulatory Visit | Attending: Cardiovascular Disease | Admitting: Cardiovascular Disease

## 2017-03-29 DIAGNOSIS — E119 Type 2 diabetes mellitus without complications: Secondary | ICD-10-CM | POA: Diagnosis not present

## 2017-03-29 DIAGNOSIS — Z794 Long term (current) use of insulin: Secondary | ICD-10-CM | POA: Insufficient documentation

## 2017-03-29 DIAGNOSIS — R079 Chest pain, unspecified: Secondary | ICD-10-CM | POA: Diagnosis not present

## 2017-03-29 DIAGNOSIS — R9439 Abnormal result of other cardiovascular function study: Secondary | ICD-10-CM | POA: Diagnosis not present

## 2017-03-29 DIAGNOSIS — E1122 Type 2 diabetes mellitus with diabetic chronic kidney disease: Secondary | ICD-10-CM | POA: Insufficient documentation

## 2017-03-29 DIAGNOSIS — N184 Chronic kidney disease, stage 4 (severe): Secondary | ICD-10-CM

## 2017-03-29 DIAGNOSIS — I2510 Atherosclerotic heart disease of native coronary artery without angina pectoris: Secondary | ICD-10-CM | POA: Insufficient documentation

## 2017-03-29 LAB — MYOCARDIAL PERFUSION IMAGING
CHL CUP NUCLEAR SDS: 5
CHL CUP RESTING HR STRESS: 99 {beats}/min
CSEPPHR: 115 {beats}/min
LV sys vol: 65 mL
LVDIAVOL: 116 mL (ref 46–106)
SRS: 6
SSS: 11
TID: 1.13

## 2017-03-29 MED ORDER — REGADENOSON 0.4 MG/5ML IV SOLN
0.4000 mg | Freq: Once | INTRAVENOUS | Status: AC
  Administered 2017-03-29: 0.4 mg via INTRAVENOUS

## 2017-03-29 MED ORDER — TECHNETIUM TC 99M TETROFOSMIN IV KIT
30.8000 | PACK | Freq: Once | INTRAVENOUS | Status: AC | PRN
  Administered 2017-03-29: 30.8 via INTRAVENOUS
  Filled 2017-03-29: qty 31

## 2017-03-29 MED ORDER — AMINOPHYLLINE 25 MG/ML IV SOLN
75.0000 mg | Freq: Once | INTRAVENOUS | Status: AC
Start: 2017-03-29 — End: 2017-03-29
  Administered 2017-03-29: 75 mg via INTRAVENOUS

## 2017-03-29 MED ORDER — TECHNETIUM TC 99M TETROFOSMIN IV KIT
10.7000 | PACK | Freq: Once | INTRAVENOUS | Status: AC | PRN
  Administered 2017-03-29: 10.7 via INTRAVENOUS
  Filled 2017-03-29: qty 11

## 2017-04-05 ENCOUNTER — Telehealth: Payer: Self-pay | Admitting: Internal Medicine

## 2017-04-05 NOTE — Telephone Encounter (Signed)
8 page, paperwork received through fax 04-05-17.

## 2017-04-05 NOTE — Progress Notes (Signed)
Dr. Gwenlyn Found. Can you review this stress test? Should I get a repeat Echo, last echo in 2018 prior to PCI was normal EF

## 2017-04-06 ENCOUNTER — Ambulatory Visit (HOSPITAL_COMMUNITY)
Admission: RE | Admit: 2017-04-06 | Discharge: 2017-04-06 | Disposition: A | Discharge status: Home / Self Care | Payer: Medicaid Other | Source: Ambulatory Visit | Attending: Nephrology | Admitting: Nephrology

## 2017-04-06 VITALS — BP 170/77 | HR 98 | Temp 97.9°F

## 2017-04-06 DIAGNOSIS — D631 Anemia in chronic kidney disease: Secondary | ICD-10-CM | POA: Diagnosis present

## 2017-04-06 DIAGNOSIS — N184 Chronic kidney disease, stage 4 (severe): Secondary | ICD-10-CM | POA: Diagnosis not present

## 2017-04-06 LAB — IRON AND TIBC
IRON: 44 ug/dL (ref 28–170)
Saturation Ratios: 25 % (ref 10.4–31.8)
TIBC: 174 ug/dL — AB (ref 250–450)
UIBC: 130 ug/dL

## 2017-04-06 LAB — POCT HEMOGLOBIN-HEMACUE: HEMOGLOBIN: 11.6 g/dL — AB (ref 12.0–15.0)

## 2017-04-06 LAB — FERRITIN: FERRITIN: 861 ng/mL — AB (ref 11–307)

## 2017-04-06 MED ORDER — EPOETIN ALFA 20000 UNIT/ML IJ SOLN
INTRAMUSCULAR | Status: AC
  Administered 2017-04-06: 20000 [IU] via SUBCUTANEOUS
  Filled 2017-04-06: qty 1

## 2017-04-06 MED ORDER — EPOETIN ALFA 20000 UNIT/ML IJ SOLN
20000.0000 [IU] | INTRAMUSCULAR | Status: DC
  Administered 2017-04-06: 20000 [IU] via SUBCUTANEOUS

## 2017-04-08 ENCOUNTER — Ambulatory Visit: Payer: Medicaid Other | Admitting: Cardiovascular Disease

## 2017-04-11 ENCOUNTER — Telehealth: Payer: Self-pay | Admitting: Cardiovascular Disease

## 2017-04-11 DIAGNOSIS — R931 Abnormal findings on diagnostic imaging of heart and coronary circulation: Secondary | ICD-10-CM

## 2017-04-11 NOTE — Telephone Encounter (Signed)
New Message:    Pt is returning call for results on labs

## 2017-04-11 NOTE — Telephone Encounter (Signed)
Follow Up:    Returning your call,concerning her Stress Test results.

## 2017-04-12 NOTE — Telephone Encounter (Signed)
Follow up     Patient calling for test results, needs them before she leaves town

## 2017-04-12 NOTE — Telephone Encounter (Signed)
Pt aware of myoview results and the need for  a repeat echo per Dr Gwenlyn Found.Order entered .Adonis Housekeeper

## 2017-04-18 ENCOUNTER — Ambulatory Visit (HOSPITAL_COMMUNITY): Payer: Medicaid Other | Attending: Cardiology

## 2017-04-18 ENCOUNTER — Other Ambulatory Visit: Payer: Self-pay

## 2017-04-18 DIAGNOSIS — R931 Abnormal findings on diagnostic imaging of heart and coronary circulation: Secondary | ICD-10-CM | POA: Diagnosis not present

## 2017-04-19 ENCOUNTER — Other Ambulatory Visit (HOSPITAL_COMMUNITY): Payer: Self-pay | Admitting: *Deleted

## 2017-04-20 ENCOUNTER — Encounter (HOSPITAL_COMMUNITY)
Admission: RE | Admit: 2017-04-20 | Discharge: 2017-04-20 | Disposition: A | Discharge status: Home / Self Care | Payer: Medicaid Other | Source: Ambulatory Visit | Attending: Nephrology | Admitting: Nephrology

## 2017-04-20 VITALS — BP 175/77 | HR 102 | Temp 98.8°F | Resp 18 | Ht 65.0 in | Wt 169.0 lb

## 2017-04-20 DIAGNOSIS — D631 Anemia in chronic kidney disease: Secondary | ICD-10-CM | POA: Diagnosis present

## 2017-04-20 DIAGNOSIS — N189 Chronic kidney disease, unspecified: Secondary | ICD-10-CM | POA: Insufficient documentation

## 2017-04-20 DIAGNOSIS — N184 Chronic kidney disease, stage 4 (severe): Secondary | ICD-10-CM

## 2017-04-20 LAB — POCT HEMOGLOBIN-HEMACUE: HEMOGLOBIN: 11.5 g/dL — AB (ref 12.0–15.0)

## 2017-04-20 MED ORDER — EPOETIN ALFA 20000 UNIT/ML IJ SOLN
20000.0000 [IU] | INTRAMUSCULAR | Status: DC
  Administered 2017-04-20: 20000 [IU] via SUBCUTANEOUS

## 2017-04-20 MED ORDER — EPOETIN ALFA 20000 UNIT/ML IJ SOLN
INTRAMUSCULAR | Status: AC
  Administered 2017-04-20: 20000 [IU] via SUBCUTANEOUS
  Filled 2017-04-20: qty 1

## 2017-04-20 MED ORDER — SODIUM CHLORIDE 0.9 % IV SOLN
510.0000 mg | Freq: Once | INTRAVENOUS | Status: AC
  Administered 2017-04-20: 510 mg via INTRAVENOUS
  Filled 2017-04-20: qty 17

## 2017-04-21 ENCOUNTER — Encounter (INDEPENDENT_AMBULATORY_CARE_PROVIDER_SITE_OTHER): Payer: Medicaid Other | Admitting: Ophthalmology

## 2017-04-21 ENCOUNTER — Telehealth: Payer: Self-pay | Admitting: Cardiovascular Disease

## 2017-04-21 DIAGNOSIS — H43813 Vitreous degeneration, bilateral: Secondary | ICD-10-CM | POA: Diagnosis not present

## 2017-04-21 DIAGNOSIS — E103313 Type 1 diabetes mellitus with moderate nonproliferative diabetic retinopathy with macular edema, bilateral: Secondary | ICD-10-CM | POA: Diagnosis not present

## 2017-04-21 DIAGNOSIS — E10311 Type 1 diabetes mellitus with unspecified diabetic retinopathy with macular edema: Secondary | ICD-10-CM

## 2017-04-21 DIAGNOSIS — H35033 Hypertensive retinopathy, bilateral: Secondary | ICD-10-CM | POA: Diagnosis not present

## 2017-04-21 DIAGNOSIS — I10 Essential (primary) hypertension: Secondary | ICD-10-CM

## 2017-04-21 NOTE — Telephone Encounter (Signed)
Notes recorded by [name], MD on 04/20/2017 at 8:07 AM EDT Low normal LV function with EF of 50%, moderate concentric LVH, and grade 2 diastolic dysfunction.   Patient made aware of results and verbalized understanding.   Patient states she is still having issues with swelling and SOB.  States her stomach continues to be distended.   Denies LE edema.   She is taking furosemide 40 mg daily and this does seem to help.  Does not weigh daily.    Next OV 6/12 with Dr. Gwenlyn Found.    Will route to PA/MD to review for further recommendations.

## 2017-04-21 NOTE — Telephone Encounter (Signed)
New message  Pt verbalzied that she is calling for RN  She stated that she is calling for results of her echo

## 2017-04-22 NOTE — Telephone Encounter (Signed)
Patient aware and verbalized understanding. °

## 2017-04-22 NOTE — Telephone Encounter (Signed)
May take additional dose of lasix as needed for swelling. However given baseline Cr 3.4, need close visit with nephrology for further adjustment of diuretic.

## 2017-05-04 ENCOUNTER — Ambulatory Visit (HOSPITAL_COMMUNITY)
Admission: RE | Admit: 2017-05-04 | Discharge: 2017-05-04 | Disposition: A | Discharge status: Home / Self Care | Payer: Medicaid Other | Source: Ambulatory Visit | Attending: Nephrology | Admitting: Nephrology

## 2017-05-04 VITALS — BP 158/71 | HR 96 | Temp 98.5°F | Resp 18

## 2017-05-04 DIAGNOSIS — D631 Anemia in chronic kidney disease: Secondary | ICD-10-CM | POA: Diagnosis not present

## 2017-05-04 DIAGNOSIS — N184 Chronic kidney disease, stage 4 (severe): Secondary | ICD-10-CM

## 2017-05-04 LAB — IRON AND TIBC
IRON: 41 ug/dL (ref 28–170)
Saturation Ratios: 24 % (ref 10.4–31.8)
TIBC: 174 ug/dL — AB (ref 250–450)
UIBC: 133 ug/dL

## 2017-05-04 LAB — POCT HEMOGLOBIN-HEMACUE: Hemoglobin: 12.1 g/dL (ref 12.0–15.0)

## 2017-05-04 LAB — FERRITIN: Ferritin: 834 ng/mL — ABNORMAL HIGH (ref 11–307)

## 2017-05-04 MED ORDER — EPOETIN ALFA 20000 UNIT/ML IJ SOLN: 20000.0000 [IU] | INTRAMUSCULAR | Status: DC

## 2017-05-06 NOTE — Progress Notes (Deleted)
Follow-up Visit   Date: 05/06/17    Susan Horton MRN: [id number] DOB: 04-27-1970   Interim History: Susan Horton is a 47 y.o. right-handed African American female with poorly controlled insulin-dependent diabetes mellitus (QQI2L 11) complicated by CKD, neuropathy, and gastroparesis, hypertension, and GERD returning for evaluation of diabetic neuropathy. The patient was accompanied to the clinic by self.  History of present illness: Patient was diagnosed with diabetes at the age of 75 while pregnant with her daughter and has been on insulin since 1999.  Unfortunately, her diabetes has never been under good control and she suffered for complications from this including neuropathy, CKD, retinopathy, and gastroparesis.  She reports having neuropathy for many years, but symptoms have got worse over the past two years.  She has numbness, burning and tingling pain of the feet and the mid-calf.  She has difficulty with balance and falls. She still walk unassisted. Last year, she reports unknowingly having a needle in the sole of her foot which was seen incidentally in x-ray of her foot. Since this time, she is very cautious about examining her feet daily. She currently takes gabapentin 600mg  BID and Cymbalta 30mg  daily.  About a year ago, she also started having numbness and tingling of the fingertips and occasionally in the forearms.  She was working as a Designer, industrial/product and has always been very independent, but because of her medical problems stopped working last year.    She has no history of alcohol use, family history of neuropathy, or vitamin B12 deficiency.   UPDATE 11/05/2016:  Since she was last here, she underwent cardiac catheretization and had PCI with DES to LAD. She does not have any new cardiac complaints. She also has medicare coverage now.  Unfortunately, she continues to have unrelenting neuropathic pain of the feet, lower legs, and hands.  Despite being on maximal dose  gabapentin and Cymbalta, she does not appreciate any benefit.  Her gabapentin was increased to 600mg  TID which only makes her sleepy and tired. She had tried OTC ointments and creams. She finds herself crying at times, because the pain can be so severe.  She has imbalance and has fallen twice.  When pain is severe, she uses a cane to walk.  UPDATE 01/05/2017:  She is here for 2 month follow-up visit.  She has been slowly reducing the dose of gabapentin due to cognitive side effects and now takes 300mg  at bedtime only, which has significantly helped her stay awake during the day.  Pain has not become worse while reducing the dose.  She is taking nortriptyline 20mg  at bedtime and Cymbalta 60mg  daily.  Unfortunately, she cannot appreciate any relief of her pain. She denies any new weakness or interval falls.   Today, she is also complaining of left shoulder achy pain, which is tender to palpation.  She has some reduced range of motion of the shoulder.  No relief with NSAIDs or tylenol.  She has not talked to her PCP about this.   UPDATE 05/06/2017:  She is here for 4 month follow-up visit.    Medications:  Current Outpatient Medications on File Prior to Visit  Medication Sig Dispense Refill  . albuterol (PROVENTIL HFA;VENTOLIN HFA) 108 (90 BASE) MCG/ACT inhaler Inhale 1-2 puffs into the lungs every 6 (six) hours as needed for wheezing or shortness of breath.     [name] amLODipine (NORVASC) 10 MG tablet TAKE 1 TABLET BY MOUTH DAILY 30 tablet 0  . aspirin 81 MG  chewable tablet Chew 1 tablet (81 mg total) by mouth daily. 30 tablet 0  . Calcifediol ER (RAYALDEE) 30 MCG CPCR Take 30 mcg by mouth daily.     . calcitRIOL (ROCALTROL) 0.25 MCG capsule Take 0.25 mcg by mouth daily.  6  . Cholecalciferol (VITAMIN D PO) Take 1 capsule by mouth daily.    . clopidogrel (PLAVIX) 75 MG tablet Take 1 tablet (75 mg total) by mouth daily with breakfast. 90 tablet 2  . cyclobenzaprine (FLEXERIL) 5 MG tablet Take 1 tablet (5 mg  total) by mouth at bedtime as needed for muscle spasms. 30 tablet 3  . DULoxetine (CYMBALTA) 60 MG capsule Take 1 capsule (60 mg total) by mouth daily. 30 capsule 3  . Ferrous Sulfate (IRON) 325 (65 Fe) MG TABS Take 325 mg by mouth daily. 30 each 3  . Fluticasone-Salmeterol (ADVAIR) 250-50 MCG/DOSE AEPB Inhale 1 puff into the lungs 2 (two) times daily. (Patient taking differently: Inhale 1 puff into the lungs 2 (two) times daily as needed (shortness of breath). ) 60 each   . furosemide (LASIX) 40 MG tablet Take 40 mg by mouth daily.  6  . gabapentin (NEURONTIN) 300 MG capsule TAKE 2 CAPSULES BY MOUTH 3 TIMES DAILY. 180 capsule 0  . Insulin Glargine (TOUJEO SOLOSTAR) 300 UNIT/ML SOPN Inject 60 Units 2 (two) times daily into the skin. 12 pen 5  . insulin regular (HUMULIN R) 100 units/mL injection Inject 20-30 into the skin 3 (three) times daily before meals. Sliding scale 30 mL 5  . losartan (COZAAR) 100 MG tablet TAKE 1 TABLET BY MOUTH DAILY 30 tablet 0  . lovastatin (MEVACOR) 20 MG tablet Take 20 mg by mouth daily.    . metoCLOPramide (REGLAN) 5 MG tablet TAKE 1 TABLET BY MOUTH 4 TIMES DAILY BEFORE MEALS AND AT BEDTIME. 90 tablet 0  . metoprolol succinate (TOPROL-XL) 100 MG 24 hr tablet Take half tab in the morning and whole tab in the pm; Take with or immediately following a meal. 45 tablet 6  . mirtazapine (REMERON SOL-TAB) 15 MG disintegrating tablet TAKE 1 TABLET BY MOUTH AT BEDTIME. 30 tablet 0  . montelukast (SINGULAIR) 10 MG tablet Take 10 mg by mouth daily as needed (allergies).     . nitroGLYCERIN (NITROSTAT) 0.4 MG SL tablet Place 1 tablet (0.4 mg total) under the tongue every 5 (five) minutes as needed. (Patient taking differently: Place 0.4 mg under the tongue every 5 (five) minutes as needed for chest pain. ) 25 tablet 3  . nortriptyline (PAMELOR) 10 MG capsule Take 3 tablet at bedtime x 1 month, then increase to 4 tablet at bedtime. (Patient taking differently: Take 20 mg by mouth at  bedtime. ) 120 capsule 5  . ondansetron (ZOFRAN) 4 MG tablet Take 1 tablet (4 mg total) by mouth daily as needed for nausea or vomiting. 30 tablet 0  . oxyCODONE-acetaminophen (PERCOCET) 5-325 MG tablet Take 1 tablet by mouth every 6 (six) hours as needed for severe pain. 8 tablet 0  . pantoprazole (PROTONIX) 40 MG tablet TAKE 1 TABLET BY MOUTH DAILY. 30 tablet 2  . senna-docusate (SENOKOT-S) 8.6-50 MG tablet Take 1 tablet by mouth 2 (two) times daily. (Patient taking differently: Take 1 tablet by mouth 2 (two) times daily as needed for mild constipation. )     No current facility-administered medications on file prior to visit.     Allergies:  Allergies  Allergen Reactions  . Penicillins Anaphylaxis, Itching, Swelling, Rash and  Other (See Comments)    Swelling of throat & whole mouth  Has patient had a PCN reaction causing immediate rash, facial/tongue/throat swelling, SOB or lightheadedness with hypotension: Yes Has patient had a PCN reaction causing severe rash involving mucus membranes or skin necrosis: No Has patient had a PCN reaction that required hospitalization: No Has patient had a PCN reaction occurring within the last 10 years: No If all of the above answers are "NO", then may proceed with Cephalosporin use.  [name] Lisinopril Cough    Review of Systems:  CONSTITUTIONAL: No fevers, chills, night sweats, or weight loss.  EYES: No visual changes or eye pain ENT: No hearing changes.  No history of nose bleeds.   RESPIRATORY: No cough, wheezing and shortness of breath.   CARDIOVASCULAR: Negative for chest pain, and palpitations.   GI: Negative for abdominal discomfort, blood in stools or black stools.  No recent change in bowel habits.   GU:  No history of incontinence.   MUSCLOSKELETAL: No history of joint pain or swelling.  No myalgias.   SKIN: Negative for lesions, rash, and itching.   ENDOCRINE: Negative for cold or heat intolerance, polydipsia or goiter.   PSYCH:  +  depression or anxiety symptoms.   NEURO: As Above.   Vital Signs:  There were no vitals taken for this visit.  General:  Well-appearing, comfortable  Neurological Exam:  Neurological Exam: MENTAL STATUS including orientation to time, place, person, recent and remote memory, attention span and concentration, language, and fund of knowledge is normal.  Speech is not dysarthric.  CRANIAL NERVES:  Face is symmetric.   MOTOR:  Motor strength is 5/5 in all extremities, except R toe flexion 2/5 and finger abductors and extensors 4/5.  She has tenderness to palpation over the left shoulder and proximal arm and neck.  MSRs:  Reflexes are 2+/4 in the arms and absent in the legs  SENSORY:  Vibration is reduced to 50% at the knees and trace distal to ankles bilaterally.   She has reduced temperature over the finger tips.  GAIT:  Mildly antalgic, wide-based, stable and unassisted   DATA:   Lab Results  Component Value Date   HGBA1C 10.0 (H) 02/01/2017   Lab Results  Component Value Date   PIRJJOAC16 606 11/05/2016   Lab Results  Component Value Date   TSH 1.930 07/26/2016   NCS/EMG 11/23/2016 right side: The electrophysiologic findings are consistent with a chronic, length-dependent sensorimotor polyneuropathy, axon loss and demyelinating in type, affecting the right arm and leg.     IMPRESSION/PLAN: 1.  Diabetic neuropathy in the setting of poorly controlled insulin dependent diabetes manfiesting with stocking-glove distribution of paresthesias, distal weakness, and sensory ataxia. Unfortuntely, she does not have any relief with gabapentin and developed side effects, so is being tapered off this.  She also takes Cymbalta 60mg /d (takes for mood), or nortriptyline 20mg  at bedtime.  I will further try to optimize nortriptyline to 30mg  at bedtime x 1 month, then increase to 40mg  qhs.  Recheck EKG in 1 month to assess QTc.  Due to CKD, unable to use carbamazepine or Lyrica.   Continue  alpha lipoic acid 600mg  daily  QTc 03/2017 438  2.  Left shoulder pain ?musculoskeletal.  No relief with NSAIDs or tylenol.  I will offer her a trial of flexeril 5mg  at bedtime.  If no improvement, recommend f/u with PCP.  Pain is not neuropathic in nature.   3.  Poorly controlled diabetes complicated by CKD (  Cr. 3.27, GFR 18), neuropathy, and gastroparesis.  She is seeing Dr. Cruzita Lederer, I also encouraged medication compliance  Return to clinic in 4 months  Greater than 50% of this 25 minute visit was spent in counseling, explanation of diagnosis, planning of further management, and coordination of care.    Thank you for allowing me to participate in patient's care.  If I can answer any additional questions, I would be pleased to do so.    Sincerely,    Donika K. Posey Pronto, DO

## 2017-05-09 ENCOUNTER — Ambulatory Visit: Payer: Medicaid Other | Admitting: Neurology

## 2017-05-13 ENCOUNTER — Encounter (INDEPENDENT_AMBULATORY_CARE_PROVIDER_SITE_OTHER): Payer: Medicaid Other | Admitting: Ophthalmology

## 2017-05-13 DIAGNOSIS — E113311 Type 2 diabetes mellitus with moderate nonproliferative diabetic retinopathy with macular edema, right eye: Secondary | ICD-10-CM

## 2017-05-13 DIAGNOSIS — E11311 Type 2 diabetes mellitus with unspecified diabetic retinopathy with macular edema: Secondary | ICD-10-CM | POA: Diagnosis not present

## 2017-05-18 ENCOUNTER — Ambulatory Visit (HOSPITAL_COMMUNITY)
Admission: RE | Admit: 2017-05-18 | Discharge: 2017-05-18 | Disposition: A | Discharge status: Home / Self Care | Payer: Medicaid Other | Source: Ambulatory Visit | Attending: Nephrology | Admitting: Nephrology

## 2017-05-18 VITALS — BP 156/79 | HR 88 | Temp 98.3°F | Resp 18

## 2017-05-18 DIAGNOSIS — D631 Anemia in chronic kidney disease: Secondary | ICD-10-CM | POA: Diagnosis present

## 2017-05-18 DIAGNOSIS — N189 Chronic kidney disease, unspecified: Secondary | ICD-10-CM | POA: Diagnosis not present

## 2017-05-18 DIAGNOSIS — N184 Chronic kidney disease, stage 4 (severe): Secondary | ICD-10-CM

## 2017-05-18 LAB — POCT HEMOGLOBIN-HEMACUE: Hemoglobin: 11.7 g/dL — ABNORMAL LOW (ref 12.0–15.0)

## 2017-05-18 MED ORDER — EPOETIN ALFA 20000 UNIT/ML IJ SOLN
20000.0000 [IU] | INTRAMUSCULAR | Status: DC
  Administered 2017-05-18: 20000 [IU] via SUBCUTANEOUS

## 2017-05-18 MED ORDER — EPOETIN ALFA 20000 UNIT/ML IJ SOLN
INTRAMUSCULAR | Status: AC
  Filled 2017-05-18: qty 1

## 2017-05-23 ENCOUNTER — Encounter: Payer: Self-pay | Admitting: Internal Medicine

## 2017-05-23 ENCOUNTER — Ambulatory Visit (INDEPENDENT_AMBULATORY_CARE_PROVIDER_SITE_OTHER): Payer: Medicaid Other | Admitting: Internal Medicine

## 2017-05-23 VITALS — BP 138/84 | HR 97 | Ht 65.0 in | Wt 166.4 lb

## 2017-05-23 DIAGNOSIS — E785 Hyperlipidemia, unspecified: Secondary | ICD-10-CM

## 2017-05-23 DIAGNOSIS — N184 Chronic kidney disease, stage 4 (severe): Secondary | ICD-10-CM | POA: Diagnosis not present

## 2017-05-23 DIAGNOSIS — Z794 Long term (current) use of insulin: Secondary | ICD-10-CM

## 2017-05-23 DIAGNOSIS — E1122 Type 2 diabetes mellitus with diabetic chronic kidney disease: Secondary | ICD-10-CM | POA: Diagnosis not present

## 2017-05-23 DIAGNOSIS — E1165 Type 2 diabetes mellitus with hyperglycemia: Secondary | ICD-10-CM | POA: Diagnosis not present

## 2017-05-23 DIAGNOSIS — E1142 Type 2 diabetes mellitus with diabetic polyneuropathy: Secondary | ICD-10-CM | POA: Diagnosis not present

## 2017-05-23 DIAGNOSIS — G629 Polyneuropathy, unspecified: Secondary | ICD-10-CM

## 2017-05-23 DIAGNOSIS — IMO0002 Reserved for concepts with insufficient information to code with codable children: Secondary | ICD-10-CM

## 2017-05-23 LAB — POCT GLYCOSYLATED HEMOGLOBIN (HGB A1C): HEMOGLOBIN A1C: 8.5

## 2017-05-23 MED ORDER — INSULIN REGULAR HUMAN 100 UNIT/ML IJ SOLN: INTRAMUSCULAR | 5 refills | Status: DC

## 2017-05-23 MED ORDER — INSULIN GLARGINE 300 UNIT/ML ~~LOC~~ SOPN: 60.0000 [IU] | PEN_INJECTOR | Freq: Two times a day (BID) | SUBCUTANEOUS | 5 refills | Status: DC

## 2017-05-23 MED FILL — TOUJEO SOLOSTAR 300 UNITS/M: 300 | 29 days supply | Qty: 18 | Fill #0

## 2017-05-23 NOTE — Progress Notes (Signed)
Subjective:     Patient ID: Susan Horton, female   DOB: Jan 04, 1971, 47 y.o.   MRN: [id number]  Diabetes    Susan Horton is a 47 y.o. woman returning for f/u for DM (unclear if 1 or 2), dx in 1996 when pregnant with her daughter, insulin-dependent since 1999, uncontrolled, with complications (peripheral neuropathy, gastroparesis, DR, CKD).  Last visit 4 months ago. Now M'aid.  She has severe gastroparesis  - needs a pacemaker.  + frequent N/V.  She is evaluated for kidney transplant.  She was on Prednisone for asthma >> sugars higher. She is also retaining fluid.  Last HbA1c: Lab Results  Component Value Date   HGBA1C 10.0 (H) 02/01/2017   HGBA1C 11.1 11/22/2016   HGBA1C 11.7 09/08/2016  07/24/2015: HbA1c 16%!  She was on: Insulin Before breakfast Before lunch Before dinner  Regular 20 20  25   NPH 40  30   + R insulin sliding scale as follows:  - 150- 165: + 1 unit  - 166- 180: + 2 units  - 181- 195: + 3 units  - 196- 210: + 4 units  - 211- 225: + 5 units  - 226- 240: + 6 units  - 241- 255: + 7 units  - 256- 270: + 8 units  - 271- 285: + 9 units  - > 286: + 10 units  Then on: - Toujeo 50 >> 60 units 2x a day - could not remember what dose taking!!!! - R insulin 40 units 3x a day after a meal >> 5-20 units ??? - could not remember what dose taking!!!!   - + Glipizide 10 mg 2x a day - started by PCP?  Now on: - Toujeo 60 units 2x a day - R insulin: - 20 units before a smaller meal - 25 units before a regular meal - 30 units before a larger meal or if you have dessert  She checks her sugars 3x a day: - am:  280-421 >> 200-300s >> low 200s >> upper 100s, 220-250 (Higher if wakes up late) - mid-morning: 90 (if skips b'fast) - 134 >> n/c - before lunch: 230-469 >> 200-300s >> 200s >> 150 - before dinner: 368-HI >> 200-300s >> 200s >> 150-200 - before bedtime: 300s >> higher 200s-300 >> 150-200 (takes 4-5 units of R if has a snack) Lowest: 180 >> 120 Highest: 300s >>  400s  Meals: - Breakfast: toast + egg, fruit - Lunch: sandwich or fruit or most of the time:  fruit smoothie - Dinner: salad, shrimp, scallops, salmon - Snacks: fruit, smoothies, peanuts  - + CKD.  Sees nephrology (Dr. Jimmy Footman). Had a fistula placed. Last BUN/creatinine was: Lab Results  Component Value Date   BUN 68 (H) 03/16/2017   Lab Results  Component Value Date   CREATININE 3.60 (H) 03/16/2017  07/24/2015: 45/2.27, GFR 28 On Losartan.  -  + HL; Latest cholesterol level:  07/24/2015: 251/733/25/117 Lab Results  Component Value Date   CHOL 239 (H) 01/01/2010   HDL 33 (L) 01/01/2010   LDLCALC 176 (H) 01/01/2010   TRIG 149 01/01/2010   CHOLHDL 7.2 Ratio 01/01/2010  On lovastatin. - last eye exam: 05/2017: + DR, + macular edema. Had cataracts sx. Still on IO injections. Just had Laser Sx+ R eye.  - + tingling/numbness in feet >> sees podiatry, neurology  She had multiple visits to the ED and admissions for hyperglycemia, gastroparesis exacerbation.  She had a cardiac stent 07/2016.  Review of  Systems Constitutional: + weight gain/no weight loss, + fatigue, + subjective hyperthermia, + subjective hypothermia, + nocturia Eyes: no blurry vision, no xerophthalmia ENT: no sore throat, no nodules palpated in throat, no dysphagia, no odynophagia, no hoarseness Cardiovascular: no CP/+ SOB/no palpitations/+ leg swelling Respiratory: no cough/+ shortness of breath and wheezing Gastrointestinal: + heartburn, nausea, vomiting, and constipation, no diarrhea Musculoskeletal: no muscle aches/no joint aches Skin: no rashes, no hair loss Neurological: no tremors/+ numbness/+ tingling/no dizziness  I reviewed pt's medications, allergies, PMH, social hx, family hx, and changes were documented in the history of present illness. Otherwise, unchanged from my initial visit note.   Objective:   Physical Exam There were no vitals taken for this visit. There is no height or weight on  file to calculate BMI. Wt Readings from Last 3 Encounters:  04/20/17 169 lb (76.7 kg)  03/29/17 164 lb (74.4 kg)  03/22/17 164 lb (74.4 kg)   Constitutional: overweight, in NAD Eyes: PERRLA, EOMI, no exophthalmos ENT: moist mucous membranes, no thyromegaly, no cervical lymphadenopathy Cardiovascular: RRR, No MRG Respiratory: CTA B Gastrointestinal: abdomen soft, NT, ND, BS+ Musculoskeletal: no deformities, strength intact in all 4 Skin: moist, warm, no rashes Neurological: no tremor with outstretched hands, DTR normal in all 4    Assessment:     1. DM - unclear If type I or type II, insulin-dependent, uncontrolled, with complications - CKD - peripheral neuropathy - DR - gastroparesis 10/06/2005: GES: Clinical Data: 46 year-old, gastroparesis.  NUCLEAR MEDICINE GASTRIC EMPTYING SCAN:  Technique: After oral ingestion of radiolabeled meal, sequential abdominal images were obtained for 120 minutes. Residual percentage of activity remaining within the stomach was calculated at 60 and 120 minutes.  Radiopharmaceutical: 2 mCi Tc-71m sulfur colloid in egg meal.  Findings: Anterior imaging was performed over the stomach over a 2-hour period. At 60 minutes, close to 100% of the activity remains in the stomach, although there is some activity in the small bowel. At 2 hours, 91% of the activity remained in the stomach.  IMPRESSION:  Marked delayed solid phase gastric emptying consistent with gastroparesis.  Patient has been diagnosed with diabetes when she was 18, during her pregnancy, but it is unclear whether this is type I or type II. She has not have episodes of DKA, which would point more towards type 2 diabetes. I would like to check her C-peptide, however her sugars, and the C-peptide measurement is only valid if the glucose is lower than 200 at the time of the check.  Component     Latest Ref Rng 06/06/2014  Glutamic Acid Decarb Ab     0.0 - 5.0 U/mL <5.0  Pancreatic Islet  Cell Antibody     Neg:<1:1 Negative   2. Diabetic peripheral neuropathy  3. HL  Plan:     1. DM - Pt with poor DM ctrl 2/2 noncompliance with sugar checks, insulin doses, appts, and also 2/2 her uncontrolled gastroparesis. She is now doing a better job coming for appts. Last visit 4 mo ago. HbA1c continues to improve: last 10%, still very high, though. -At last visit, she was doing a better job taking the Toujeo twice a day and also her regular insulin.  We are using regular insulin since this has better results in patients with gastroparesis.  At last visit, we discussed about adjusting the dose of prandial insulin depending on the size of the meal.  We also increase the regular insulin doses.  She may benefit from U500 insulin in the  future.  We may need to go back to insulin pump, but with U500 insulin in the pump.  She is interested in this to reduce the number of injections she is doing. - At this insulin and I advised her to check with her insurance whether U500 insulin would be covered.  If so, we will switch to a  Bid or tid regimen.  Ultimately, we can switch to insulin pump with U500 insulin if both approved by insurance - For now, sugars are still high but definitely improved.  Her higher sugars are in the morning so we discussed about eating her Toujeo to a lower dose in the morning and the higher dose at bedtime.  I also advised her to increase slightly her regular insulin to control during the day - Unfortunately, we cannot add other diabetic medicines do to her GI problems and also her CKD stage IV - I advised her to: Patient Instructions  Please change: - Toujeo to 40 units in am and 80 units at bedtime - R insulin:  - 25 units with a smaller meal - 30 units with a regular meal - 35 units with a large meal  If we can use U500 insulin, please let me know. (vials or pens)  Please return in 3 months with your sugar log.   - today, HbA1c is 8.5% (improved) - continue checking  sugars at different times of the day - check 3x a day, rotating checks - advised for yearly eye exams >> she is UTD - Return to clinic in 3 mo with sugar log    2. Diabetic peripheral neuropathy - sees neurology - on Cymbalta, Neurontin, but still with breakthrough pain and numbness - ALA + B complex did not work. Cannot use Lyrica 2/2 CKD.  3. HL - Reviewed latest lipid panel from 2017: LDL high, TG very high - Continues the statin without side effects. - she is due for another lipid panel >> she has an appointment with PCP in the next few days.  We will need to get her records from then.   Philemon Kingdom, MD PhD [facility] Endocrinology

## 2017-05-23 NOTE — Patient Instructions (Addendum)
Please change: - Toujeo to 40 units in am and 80 units at bedtime - R insulin:  - 25 units with a smaller meal - 30 units with a regular meal - 35 units with a large meal  If we can use U500 insulin, please let me know. (vials or pens)  Please return in 3 months with your sugar log.

## 2017-05-24 MED FILL — METOCLOPRAMIDE 5 MG TABLET: 5 | 22 days supply | Qty: 90 | Fill #0

## 2017-05-24 MED FILL — CALCITRIOL 0.25 MCG CAPS: 0.25 | 30 days supply | Qty: 30 | Fill #0

## 2017-05-24 MED FILL — AMLODIPINE BESYLATE 10 MG T: 10 | 30 days supply | Qty: 30 | Fill #0

## 2017-05-24 MED FILL — CLOPIDOGREL 75 MG TABLET: 75 | 30 days supply | Qty: 30 | Fill #0

## 2017-05-24 MED FILL — LOSARTAN POTASSIUM 100 MG T: 100 | 30 days supply | Qty: 30 | Fill #0

## 2017-05-24 MED FILL — PANTOPRAZOLE SOD DR 40 MG T: 40 | 30 days supply | Qty: 30 | Fill #0

## 2017-05-24 MED FILL — HumuLIN R 100 UNIT/ML SOLN: 100 | 26 days supply | Qty: 20 | Fill #1

## 2017-05-24 MED FILL — CYCLOBENZAPRINE 5 MG TABLET: 5 | 30 days supply | Qty: 30 | Fill #1

## 2017-05-24 MED FILL — FUROSEMIDE 40 MG TAB: 40 | 30 days supply | Qty: 30 | Fill #1

## 2017-05-24 MED FILL — NORTRIPTYLINE HCL 10 MG CAP: 10 | 30 days supply | Qty: 120 | Fill #1

## 2017-05-25 ENCOUNTER — Telehealth: Payer: Self-pay | Admitting: Internal Medicine

## 2017-05-25 ENCOUNTER — Encounter (HOSPITAL_COMMUNITY): Payer: Medicaid Other

## 2017-05-25 NOTE — Telephone Encounter (Signed)
Patient stated that [facility] told her to see if medicaid would cover a U 500 insulin, patient called and talked with someone at [facility] and they told her that, they need the code for the U 500, they look it up by the code not the name of the insulin. Please advise

## 2017-05-26 ENCOUNTER — Encounter (INDEPENDENT_AMBULATORY_CARE_PROVIDER_SITE_OTHER): Payer: Medicaid Other | Admitting: Ophthalmology

## 2017-05-26 DIAGNOSIS — E11311 Type 2 diabetes mellitus with unspecified diabetic retinopathy with macular edema: Secondary | ICD-10-CM | POA: Diagnosis not present

## 2017-05-26 DIAGNOSIS — E113312 Type 2 diabetes mellitus with moderate nonproliferative diabetic retinopathy with macular edema, left eye: Secondary | ICD-10-CM | POA: Diagnosis not present

## 2017-05-26 NOTE — Telephone Encounter (Signed)
Patient's problem list shows E11.42 and E11.65. Pt informed.

## 2017-06-01 ENCOUNTER — Ambulatory Visit (HOSPITAL_COMMUNITY)
Admission: RE | Admit: 2017-06-01 | Discharge: 2017-06-01 | Disposition: A | Discharge status: Home / Self Care | Payer: Medicaid Other | Source: Ambulatory Visit | Attending: Nephrology | Admitting: Nephrology

## 2017-06-01 VITALS — BP 173/83 | HR 95 | Temp 98.1°F | Resp 18

## 2017-06-01 DIAGNOSIS — Z79899 Other long term (current) drug therapy: Secondary | ICD-10-CM | POA: Diagnosis not present

## 2017-06-01 DIAGNOSIS — N184 Chronic kidney disease, stage 4 (severe): Secondary | ICD-10-CM

## 2017-06-01 DIAGNOSIS — Z5181 Encounter for therapeutic drug level monitoring: Secondary | ICD-10-CM | POA: Diagnosis not present

## 2017-06-01 DIAGNOSIS — D631 Anemia in chronic kidney disease: Secondary | ICD-10-CM | POA: Insufficient documentation

## 2017-06-01 LAB — POCT HEMOGLOBIN-HEMACUE: HEMOGLOBIN: 12.8 g/dL (ref 12.0–15.0)

## 2017-06-01 LAB — FERRITIN: FERRITIN: 787 ng/mL — AB (ref 11–307)

## 2017-06-01 LAB — IRON AND TIBC
Iron: 36 ug/dL (ref 28–170)
SATURATION RATIOS: 18 % (ref 10.4–31.8)
TIBC: 195 ug/dL — AB (ref 250–450)
UIBC: 159 ug/dL

## 2017-06-01 MED ORDER — EPOETIN ALFA 20000 UNIT/ML IJ SOLN: 20000.0000 [IU] | INTRAMUSCULAR | Status: DC

## 2017-06-03 ENCOUNTER — Ambulatory Visit: Payer: Medicaid Other | Admitting: Neurology

## 2017-06-03 ENCOUNTER — Other Ambulatory Visit: Payer: Self-pay | Admitting: *Deleted

## 2017-06-03 ENCOUNTER — Encounter: Payer: Self-pay | Admitting: Neurology

## 2017-06-03 VITALS — BP 140/88 | HR 55 | Ht 65.0 in | Wt 166.4 lb

## 2017-06-03 DIAGNOSIS — E1342 Other specified diabetes mellitus with diabetic polyneuropathy: Secondary | ICD-10-CM | POA: Diagnosis not present

## 2017-06-03 DIAGNOSIS — Z79899 Other long term (current) drug therapy: Secondary | ICD-10-CM | POA: Diagnosis not present

## 2017-06-03 MED ORDER — LIDOCAINE 5 % EX OINT: TOPICAL_OINTMENT | CUTANEOUS | 3 refills | Status: DC

## 2017-06-03 MED ORDER — LIDOCAINE 5 % EX OINT
TOPICAL_OINTMENT | CUTANEOUS | 3 refills | Status: DC
Start: 2017-06-03 — End: 2017-06-03

## 2017-06-03 MED ORDER — NORTRIPTYLINE HCL 10 MG PO CAPS: ORAL_CAPSULE | ORAL | 3 refills | Status: DC

## 2017-06-03 MED FILL — LIDOCAINE 5 % OINT: 5 | 15 days supply | Qty: 35 | Fill #0

## 2017-06-03 NOTE — Patient Instructions (Addendum)
Increase nortriptyline to 30mg  at bedtime  Start lidocaine ointment to feet.  Check EKG in 1 month  We will refer you to pain management for further recommendations  Return to clinic in 6 months

## 2017-06-03 NOTE — Progress Notes (Signed)
Follow-up Visit   Date: 06/03/17    Susan Horton MRN: [id number] DOB: May 16, 1970   Interim History: Susan Horton is a 47 y.o. right-handed African American female with poorly controlled insulin-dependent diabetes mellitus (IRC7E 11) complicated by CKD, neuropathy, and gastroparesis, hypertension, CAD s/p PCI stent to LAD (2018), and GERD returning for evaluation of diabetic neuropathy. The patient was accompanied to the clinic by self.  History of present illness: Patient was diagnosed with diabetes at the age of 33 while pregnant with her daughter and has been on insulin since 1999.  Unfortunately, her diabetes has never been under good control and she suffered for complications from this including neuropathy, CKD, retinopathy, and gastroparesis. She has neuropathy for many years, but symptoms have got worse since 2016.  She has numbness, burning and tingling pain of the feet and the mid-calf.  She has difficulty with balance/falls and walk unassisted. Last year, she reports unknowingly having a needle in the sole of her foot which was seen incidentally in x-ray of her foot. Since this time, she is very cautious about examining her feet daily. She takes gabapentin 600mg  BID and Cymbalta 30mg  daily.  About a year ago, she also started having numbness and tingling of the fingertips and occasionally in the forearms.  She was working as a Designer, industrial/product and has always been very independent, but because of her medical problems stopped working in 2017.  She has no history of alcohol use, family history of neuropathy, or vitamin B12 deficiency.   UPDATE 11/05/2016:   Despite being on maximal dose gabapentin and Cymbalta (renally dosed), she does not appreciate any benefit with pain relief.  Her gabapentin was increased to 600mg  TID which only makes her sleepy and tired. She had tried OTC ointments and creams. She finds herself crying at times, because the pain can be so severe.   UPDATE  01/05/2017:   She has been slowly reducing the dose of gabapentin due to cognitive side effects and now takes 300mg  at bedtime only, which has significantly helped her stay awake during the day.  Pain has not become worse while reducing the dose.  She is taking nortriptyline 20mg  at bedtime and Cymbalta 60mg  daily.  Unfortunately, she cannot appreciate any relief of her pain.  UPDATE 06/03/2017:  She is here for 6 month follow-up visit. She is undergoing work-up for possible kidney transplant and may be started hemodialysis in the future.  Unfortunately, she continues to have unrelenting neuropathic pain of the feet and lower legs.  She does not get any relief with nortriptyline 20mg  and tapered off gabapentin due to no benefit and sedation.  She denies any new weakness and continues to walk unassisted. Fortunately, she has not suffered any falls but can trip frequently.  She has self-restricted driving due to inability to feel the pedals when driving.  She has poor quality of life because nothing seems to give any relief of her pain.  She has tried very hard to manage blood sugars which is down to 8.5, previously 11, which is great.    Medications:  Current Outpatient Medications on File Prior to Visit  Medication Sig Dispense Refill  . albuterol (PROVENTIL HFA;VENTOLIN HFA) 108 (90 BASE) MCG/ACT inhaler Inhale 1-2 puffs into the lungs every 6 (six) hours as needed for wheezing or shortness of breath.     [name] amLODipine (NORVASC) 10 MG tablet TAKE 1 TABLET BY MOUTH DAILY 30 tablet 0  . aspirin 81 MG chewable  tablet Chew 1 tablet (81 mg total) by mouth daily. 30 tablet 0  . Calcifediol ER (RAYALDEE) 30 MCG CPCR Take 30 mcg by mouth daily.     . calcitRIOL (ROCALTROL) 0.25 MCG capsule Take 0.25 mcg by mouth daily.  6  . Cholecalciferol (VITAMIN D PO) Take 1 capsule by mouth daily.    . clopidogrel (PLAVIX) 75 MG tablet Take 1 tablet (75 mg total) by mouth daily with breakfast. 90 tablet 2  .  cyclobenzaprine (FLEXERIL) 5 MG tablet Take 1 tablet (5 mg total) by mouth at bedtime as needed for muscle spasms. 30 tablet 3  . DULoxetine (CYMBALTA) 60 MG capsule Take 1 capsule (60 mg total) by mouth daily. 30 capsule 3  . Ferrous Sulfate (IRON) 325 (65 Fe) MG TABS Take 325 mg by mouth daily. 30 each 3  . Fluticasone-Salmeterol (ADVAIR) 250-50 MCG/DOSE AEPB Inhale 1 puff into the lungs 2 (two) times daily. (Patient taking differently: Inhale 1 puff into the lungs 2 (two) times daily as needed (shortness of breath). ) 60 each   . furosemide (LASIX) 40 MG tablet Take 40 mg by mouth daily.  6  . gabapentin (NEURONTIN) 300 MG capsule TAKE 2 CAPSULES BY MOUTH 3 TIMES DAILY. 180 capsule 0  . Insulin Glargine (TOUJEO SOLOSTAR) 300 UNIT/ML SOPN Inject 60 Units into the skin 2 (two) times daily. 12 pen 5  . insulin regular (HUMULIN R) 100 units/mL injection Inject 25-35 into the skin 3 (three) times daily before meals. Sliding scale 30 mL 5  . losartan (COZAAR) 100 MG tablet TAKE 1 TABLET BY MOUTH DAILY 30 tablet 0  . lovastatin (MEVACOR) 20 MG tablet Take 20 mg by mouth daily.    . metoCLOPramide (REGLAN) 5 MG tablet TAKE 1 TABLET BY MOUTH 4 TIMES DAILY BEFORE MEALS AND AT BEDTIME. 90 tablet 0  . metoprolol succinate (TOPROL-XL) 100 MG 24 hr tablet Take half tab in the morning and whole tab in the pm; Take with or immediately following a meal. 45 tablet 6  . mirtazapine (REMERON SOL-TAB) 15 MG disintegrating tablet TAKE 1 TABLET BY MOUTH AT BEDTIME. 30 tablet 0  . montelukast (SINGULAIR) 10 MG tablet Take 10 mg by mouth daily as needed (allergies).     . nitroGLYCERIN (NITROSTAT) 0.4 MG SL tablet Place 1 tablet (0.4 mg total) under the tongue every 5 (five) minutes as needed. (Patient taking differently: Place 0.4 mg under the tongue every 5 (five) minutes as needed for chest pain. ) 25 tablet 3  . ondansetron (ZOFRAN) 4 MG tablet Take 1 tablet (4 mg total) by mouth daily as needed for nausea or vomiting.  30 tablet 0  . oxyCODONE-acetaminophen (PERCOCET) 5-325 MG tablet Take 1 tablet by mouth every 6 (six) hours as needed for severe pain. 8 tablet 0  . pantoprazole (PROTONIX) 40 MG tablet TAKE 1 TABLET BY MOUTH DAILY. 30 tablet 2  . senna-docusate (SENOKOT-S) 8.6-50 MG tablet Take 1 tablet by mouth 2 (two) times daily. (Patient taking differently: Take 1 tablet by mouth 2 (two) times daily as needed for mild constipation. )     No current facility-administered medications on file prior to visit.     Allergies:  Allergies  Allergen Reactions  . Penicillins Anaphylaxis, Itching, Swelling, Rash and Other (See Comments)    Swelling of throat & whole mouth  Has patient had a PCN reaction causing immediate rash, facial/tongue/throat swelling, SOB or lightheadedness with hypotension: Yes Has patient had a PCN reaction causing  severe rash involving mucus membranes or skin necrosis: No Has patient had a PCN reaction that required hospitalization: No Has patient had a PCN reaction occurring within the last 10 years: No If all of the above answers are "NO", then may proceed with Cephalosporin use.  [name] Lisinopril Cough    Review of Systems:  CONSTITUTIONAL: No fevers, chills, night sweats, or weight loss.  EYES: No visual changes or eye pain ENT: No hearing changes.  No history of nose bleeds.   RESPIRATORY: No cough, wheezing and shortness of breath.   CARDIOVASCULAR: Negative for chest pain, and palpitations.   GI: Negative for abdominal discomfort, blood in stools or black stools.  No recent change in bowel habits.   GU:  No history of incontinence.   MUSCLOSKELETAL: +history of joint pain or swelling.  No myalgias.   SKIN: Negative for lesions, rash, and itching.   ENDOCRINE: Negative for cold or heat intolerance, polydipsia or goiter.   PSYCH:  + depression or anxiety symptoms.   NEURO: As Above.   Vital Signs:  BP 140/88   Pulse (!) 55   Ht 5\' 5"  (1.651 m)   Wt 166 lb 6 oz (75.5 kg)    SpO2 94%   BMI 27.69 kg/m   General Medical Exam:   General:  Well appearing, comfortable  Neck:  No carotid bruits. Respiratory:  Clear to auscultation, good air entry bilaterally.   Cardiac:  Regular rate and rhythm, no murmur.   Ext: LUE fistula, no edema or lesions  Neurological Exam:  Neurological Exam: MENTAL STATUS including orientation to time, place, person, recent and remote memory, attention span and concentration, language, and fund of knowledge is normal.  Speech is not dysarthric.  CRANIAL NERVES:  Pupils are round and reactive.  Extraocular muscles intact. Face is symmetric.   MOTOR:  Motor strength is 5/5 in all extremities, except R toe flexion 2/5 and finger abductors and extensors 4/5.   MSRs:  Reflexes are 1+/4 in the arms and absent in the legs  SENSORY:  Vibration is reduced to 50% at the knees and trace distal to ankles bilaterally.   Reduced temperature and light touch distal to knees bilaterally and over the finger tips.  GAIT:  Mildly-wide-based, stable and unassisted    DATA:   Lab Results  Component Value Date   HGBA1C 8.5 05/23/2017   Lab Results  Component Value Date   [id number] 283 11/05/2016   Lab Results  Component Value Date   TSH 1.930 07/26/2016   NCS/EMG 11/23/2016 right side: The electrophysiologic findings are consistent with a chronic, length-dependent sensorimotor polyneuropathy, axon loss and demyelinating in type, affecting the right arm and leg.    EKG 03/22/2017:  QTc 438  IMPRESSION/PLAN: 1.  Diabetic painful neuropathy in the setting of poorly controlled insulin dependent diabetes manfiesting with stocking-glove distribution of paresthesias, distal weakness, and sensory ataxia.  Pain has been very difficult to control and due to her renal dysfunction, options have been limited.  She was previously on gabapentin as high as 600mg  TID, but due to no benefit and side effects, this was slowly discontinued. Previously tried:   Gabapentin (ineffective).  Unable to use Lyrica or carbamazepine due to CKD  Currently, she takes Cymbalta 60mg /d (mood) and nortriptyline 20mg  at bedtime and does not appreciate any benefit.  I will titrate her nortriptyline to 30mg  daily and plan to recheck EKG in 1 month.   Due to her poor pain control and reduced quality of  life, I will send her to Pain Management for management.   Fall and driving precautions discussed.  She is very compliant with daily foot inspection.   2.  Poorly controlled diabetes complicated by CKD (Cr. 4.26, GFR 16), gastroparesis, macular edema planning to start hemodialysis in the future and undergoing evaluation for kidney transplant  Return to clinic in 6 months   Thank you for allowing me to participate in patient's care.  If I can answer any additional questions, I would be pleased to do so.    Sincerely,    Jashanti Clinkscale K. Posey Pronto, DO

## 2017-06-08 MED FILL — METOPROLOL SUCCINATE ER 50: 50 | 60 days supply | Qty: 60 | Fill #0

## 2017-06-10 ENCOUNTER — Encounter: Payer: Self-pay | Admitting: Family Medicine

## 2017-06-10 ENCOUNTER — Encounter

## 2017-06-10 ENCOUNTER — Other Ambulatory Visit (HOSPITAL_COMMUNITY): Payer: Self-pay

## 2017-06-10 ENCOUNTER — Ambulatory Visit: Payer: Medicaid Other | Attending: Family Medicine | Admitting: Family Medicine

## 2017-06-10 VITALS — BP 175/89 | HR 102 | Temp 97.8°F | Ht 65.0 in | Wt 167.0 lb

## 2017-06-10 DIAGNOSIS — Z9889 Other specified postprocedural states: Secondary | ICD-10-CM | POA: Diagnosis not present

## 2017-06-10 DIAGNOSIS — E1122 Type 2 diabetes mellitus with diabetic chronic kidney disease: Secondary | ICD-10-CM | POA: Insufficient documentation

## 2017-06-10 DIAGNOSIS — Z79899 Other long term (current) drug therapy: Secondary | ICD-10-CM | POA: Insufficient documentation

## 2017-06-10 DIAGNOSIS — E1142 Type 2 diabetes mellitus with diabetic polyneuropathy: Secondary | ICD-10-CM

## 2017-06-10 DIAGNOSIS — E1165 Type 2 diabetes mellitus with hyperglycemia: Secondary | ICD-10-CM | POA: Insufficient documentation

## 2017-06-10 DIAGNOSIS — K3184 Gastroparesis: Secondary | ICD-10-CM | POA: Diagnosis not present

## 2017-06-10 DIAGNOSIS — Z7951 Long term (current) use of inhaled steroids: Secondary | ICD-10-CM | POA: Diagnosis not present

## 2017-06-10 DIAGNOSIS — I129 Hypertensive chronic kidney disease with stage 1 through stage 4 chronic kidney disease, or unspecified chronic kidney disease: Secondary | ICD-10-CM | POA: Insufficient documentation

## 2017-06-10 DIAGNOSIS — N184 Chronic kidney disease, stage 4 (severe): Secondary | ICD-10-CM | POA: Diagnosis not present

## 2017-06-10 DIAGNOSIS — Z79891 Long term (current) use of opiate analgesic: Secondary | ICD-10-CM | POA: Diagnosis not present

## 2017-06-10 DIAGNOSIS — Z9861 Coronary angioplasty status: Secondary | ICD-10-CM | POA: Diagnosis not present

## 2017-06-10 DIAGNOSIS — Z955 Presence of coronary angioplasty implant and graft: Secondary | ICD-10-CM | POA: Diagnosis not present

## 2017-06-10 DIAGNOSIS — Z794 Long term (current) use of insulin: Secondary | ICD-10-CM | POA: Diagnosis not present

## 2017-06-10 DIAGNOSIS — Z7902 Long term (current) use of antithrombotics/antiplatelets: Secondary | ICD-10-CM | POA: Insufficient documentation

## 2017-06-10 DIAGNOSIS — I2510 Atherosclerotic heart disease of native coronary artery without angina pectoris: Secondary | ICD-10-CM

## 2017-06-10 DIAGNOSIS — K219 Gastro-esophageal reflux disease without esophagitis: Secondary | ICD-10-CM | POA: Insufficient documentation

## 2017-06-10 DIAGNOSIS — J4540 Moderate persistent asthma, uncomplicated: Secondary | ICD-10-CM | POA: Insufficient documentation

## 2017-06-10 DIAGNOSIS — E1143 Type 2 diabetes mellitus with diabetic autonomic (poly)neuropathy: Secondary | ICD-10-CM | POA: Insufficient documentation

## 2017-06-10 DIAGNOSIS — Z7982 Long term (current) use of aspirin: Secondary | ICD-10-CM | POA: Insufficient documentation

## 2017-06-10 DIAGNOSIS — E1121 Type 2 diabetes mellitus with diabetic nephropathy: Secondary | ICD-10-CM | POA: Insufficient documentation

## 2017-06-10 DIAGNOSIS — Z9049 Acquired absence of other specified parts of digestive tract: Secondary | ICD-10-CM | POA: Diagnosis not present

## 2017-06-10 DIAGNOSIS — I10 Essential (primary) hypertension: Secondary | ICD-10-CM | POA: Diagnosis not present

## 2017-06-10 DIAGNOSIS — IMO0002 Reserved for concepts with insufficient information to code with codable children: Secondary | ICD-10-CM

## 2017-06-10 LAB — GLUCOSE, POCT (MANUAL RESULT ENTRY): POC Glucose: 361 mg/dl — AB (ref 70–99)

## 2017-06-10 MED ORDER — FLUTICASONE-SALMETEROL 250-50 MCG/DOSE IN AEPB: 1.0000 | INHALATION_SPRAY | Freq: Two times a day (BID) | RESPIRATORY_TRACT | 3 refills | Status: DC

## 2017-06-10 NOTE — Progress Notes (Signed)
Subjective:  Patient ID: Susan Horton, female    DOB: January 07, 1971  Age: 47 y.o. MRN: [id number]  CC: Establish Care   HPI Susan Horton is a 47 year old female with a history of Type 2 Diabetes Mellitus (A1c 8.5), Hypertension, CAD Stage IV CKD, Asthma here for a follow up visit. Her blood pressure is elevated and she informs me she has been compliant with her antihypertensives and her regimen was just adjusted by her Nephrologist,Dr Deterding at her visit today. She is currently on the transplant list at [facility]. She does experience shortness of breath , orthopnea which makes it difficult to sleep and her Lasix dose was increased today. She has been out of Advair and is having to use her MDI more.  Diabetes is managed by Dr Renne Crigler of Endocrinology and at her last visit on 05/23/17 her Toujeo was changed to 40 units in the morning and 80 in the evening along with regular insulin 25/30/35. She sees Neurology for Diabetic Neuropathy which is uncontrolled on her current regimen (Cymbalta and Nortriptyline) and she was referred to pain management.  Past Medical History:  Diagnosis Date  . Anemia   . Asthma   . CAD (coronary artery disease)    DES to mid LAD July 2018, residual moderate RCA disease  . Cataract   . CKD (chronic kidney disease) stage 4, GFR 15-29 ml/min (HCC)   . DVT (deep venous thrombosis) ([place])    1996 during pregnancy, 2015 left leg  . Essential hypertension 12/11/2015  . Gastroparesis   . GERD (gastroesophageal reflux disease)   . Hidradenitis   . Migraine   . Neuropathy   . Type 2 diabetes mellitus ([place])   . Type II diabetes mellitus ([place])     Past Surgical History:  Procedure Laterality Date  . ABDOMINOPLASTY    . APPENDECTOMY  1995  . AV FISTULA PLACEMENT Left 02/01/2017   Procedure: ARTERIOVENOUS BRACHIOCEPHALIC (AV) FISTULA CREATION;  Surgeon: [name], MD;  Location: [place];  Service: Vascular;  Laterality: Left;  . CHOLECYSTECTOMY  1993  . CORONARY  ANGIOPLASTY WITH STENT PLACEMENT    . CORONARY STENT INTERVENTION N/A 08/05/2016   Procedure: Coronary Stent Intervention;  Surgeon: [name], MD;  Location: [place] [facility];  Service: Cardiovascular;  Laterality: N/A;  . EXPLORATORY LAPAROTOMY  08/14/2005   lysis of adhesions, drainage of tubo-ovarian abscess  . HYDRADENITIS EXCISION  02/08/2011   Procedure: EXCISION HYDRADENITIS GROIN;  Surgeon: [name], MD;  Location: [place];  Service: General;  Laterality: N/A;  Excisioin of Hidradenitis Left groin  . INGUINAL HIDRADENITIS EXCISION  07/06/2010   bilateral  . LEFT HEART CATH AND CORONARY ANGIOGRAPHY N/A 08/05/2016   Procedure: Left Heart Cath and Coronary Angiography;  Surgeon: [name], MD;  Location: [place] [facility];  Service: Cardiovascular;  Laterality: N/A;  . REDUCTION MAMMAPLASTY  2002  . TONSILLECTOMY    . TUBAL LIGATION  1996  . VAGINAL HYSTERECTOMY  08/04/2005   and cysto      Outpatient Medications Prior to Visit  Medication Sig Dispense Refill  . albuterol (PROVENTIL HFA;VENTOLIN HFA) 108 (90 BASE) MCG/ACT inhaler Inhale 1-2 puffs into the lungs every 6 (six) hours as needed for wheezing or shortness of breath.     [name] amLODipine (NORVASC) 10 MG tablet TAKE 1 TABLET BY MOUTH DAILY 30 tablet 0  . aspirin 81 MG chewable tablet Chew 1 tablet (81 mg total)  by mouth daily. 30 tablet 0  . Calcifediol ER (RAYALDEE) 30 MCG CPCR Take 30 mcg by mouth daily.     . calcitRIOL (ROCALTROL) 0.25 MCG capsule Take 0.25 mcg by mouth daily.  6  . Cholecalciferol (VITAMIN D PO) Take 1 capsule by mouth daily.    . clopidogrel (PLAVIX) 75 MG tablet Take 1 tablet (75 mg total) by mouth daily with breakfast. 90 tablet 2  . cyclobenzaprine (FLEXERIL) 5 MG tablet Take 1 tablet (5 mg total) by mouth at bedtime as needed for muscle spasms. 30 tablet 3  . DULoxetine (CYMBALTA) 60 MG capsule Take 1 capsule (60 mg total) by mouth daily. 30 capsule 3  .  Ferrous Sulfate (IRON) 325 (65 Fe) MG TABS Take 325 mg by mouth daily. 30 each 3  . furosemide (LASIX) 40 MG tablet Take 40 mg by mouth daily.  6  . gabapentin (NEURONTIN) 300 MG capsule TAKE 2 CAPSULES BY MOUTH 3 TIMES DAILY. 180 capsule 0  . Insulin Glargine (TOUJEO SOLOSTAR) 300 UNIT/ML SOPN Inject 60 Units into the skin 2 (two) times daily. 12 pen 5  . insulin regular (HUMULIN R) 100 units/mL injection Inject 25-35 into the skin 3 (three) times daily before meals. Sliding scale 30 mL 5  . lidocaine (XYLOCAINE) 5 % ointment Apply to feet twice daily as needed for severe pain.  Use gloves. 35.44 g 3  . losartan (COZAAR) 100 MG tablet TAKE 1 TABLET BY MOUTH DAILY 30 tablet 0  . lovastatin (MEVACOR) 20 MG tablet Take 20 mg by mouth daily.    . metoCLOPramide (REGLAN) 5 MG tablet TAKE 1 TABLET BY MOUTH 4 TIMES DAILY BEFORE MEALS AND AT BEDTIME. 90 tablet 0  . metoprolol succinate (TOPROL-XL) 100 MG 24 hr tablet Take half tab in the morning and whole tab in the pm; Take with or immediately following a meal. 45 tablet 6  . mirtazapine (REMERON SOL-TAB) 15 MG disintegrating tablet TAKE 1 TABLET BY MOUTH AT BEDTIME. 30 tablet 0  . montelukast (SINGULAIR) 10 MG tablet Take 10 mg by mouth daily as needed (allergies).     . nitroGLYCERIN (NITROSTAT) 0.4 MG SL tablet Place 1 tablet (0.4 mg total) under the tongue every 5 (five) minutes as needed. (Patient taking differently: Place 0.4 mg under the tongue every 5 (five) minutes as needed for chest pain. ) 25 tablet 3  . nortriptyline (PAMELOR) 10 MG capsule Take 3 tablet at bedtime. 120 capsule 3  . pantoprazole (PROTONIX) 40 MG tablet TAKE 1 TABLET BY MOUTH DAILY. 30 tablet 2  . senna-docusate (SENOKOT-S) 8.6-50 MG tablet Take 1 tablet by mouth 2 (two) times daily. (Patient taking differently: Take 1 tablet by mouth 2 (two) times daily as needed for mild constipation. )    . Fluticasone-Salmeterol (ADVAIR) 250-50 MCG/DOSE AEPB Inhale 1 puff into the lungs 2  (two) times daily. (Patient taking differently: Inhale 1 puff into the lungs 2 (two) times daily as needed (shortness of breath). ) 60 each   . ondansetron (ZOFRAN) 4 MG tablet Take 1 tablet (4 mg total) by mouth daily as needed for nausea or vomiting. (Patient not taking: Reported on 06/10/2017) 30 tablet 0  . oxyCODONE-acetaminophen (PERCOCET) 5-325 MG tablet Take 1 tablet by mouth every 6 (six) hours as needed for severe pain. (Patient not taking: Reported on 06/10/2017) 8 tablet 0   No facility-administered medications prior to visit.     ROS Review of Systems  Constitutional: Negative for activity change, appetite  change and fatigue.  HENT: Negative for congestion, sinus pressure and sore throat.   Eyes: Negative for visual disturbance.  Respiratory: Positive for shortness of breath. Negative for cough, chest tightness and wheezing.   Cardiovascular: Negative for chest pain and palpitations.  Gastrointestinal: Negative for abdominal distention, abdominal pain and constipation.  Endocrine: Negative for polydipsia.  Genitourinary: Negative for dysuria and frequency.  Musculoskeletal: Negative for arthralgias and back pain.  Skin: Negative for rash.  Neurological: Negative for tremors, light-headedness and numbness.  Hematological: Does not bruise/bleed easily.  Psychiatric/Behavioral: Negative for agitation and behavioral problems.    Objective:  BP (!) 175/89   Pulse (!) 102   Temp 97.8 F (36.6 C) (Oral)   Ht 5\' 5"  (1.651 m)   Wt 167 lb (75.8 kg)   SpO2 100%   BMI 27.79 kg/m   BP/Weight 06/10/2017 06/03/2017 1/54/0086  Systolic BP 761 950 932  Diastolic BP 89 88 83  Wt. (Lbs) 167 166.38 -  BMI 27.79 27.69 -      Physical Exam  Constitutional: She is oriented to person, place, and time. She appears well-developed and well-nourished.  Cardiovascular: Normal rate, normal heart sounds and intact distal pulses.  No murmur heard. Pulmonary/Chest: Effort normal and breath  sounds normal. She has no wheezes. She has no rales. She exhibits no tenderness.  Abdominal: Soft. Bowel sounds are normal. She exhibits no distension and no mass. There is no tenderness.  Musculoskeletal: Normal range of motion.  Neurological: She is alert and oriented to person, place, and time.  Skin: Skin is warm and dry.  Psychiatric: She has a normal mood and affect.   Lab Results  Component Value Date   HGBA1C 8.5 05/23/2017     Assessment & Plan:   1. Uncontrolled type 2 diabetes mellitus with peripheral neuropathy ([place]) Uncontrolled with A1c of 8.5 Currently followed by Endocrine - POCT glucose (manual entry) - Ambulatory referral to Podiatry  2. Gastroparesis Stable Continue Reglan  3. CKD (chronic kidney disease) stage 4, GFR 15-29 ml/min (HCC) Hypertensive and Diabetic Nephropathy Followed by Nephrology - Dr Deterding  Currently on [facility] transplant list  4. CAD S/P mLAD PCI with DES No angina Risk factor modification  5. Essential hypertension, benign Uncontrolled Antihypertensive was recently adjusted by Nephrology Low sodium, DASH diet  6. Moderate persistent asthma without complication Uncontrolled as she has been out of Advair - Fluticasone-Salmeterol (ADVAIR) 250-50 MCG/DOSE AEPB; Inhale 1 puff into the lungs 2 (two) times daily.  Dispense: 60 each; Refill: 3   Meds ordered this encounter  Medications  . Fluticasone-Salmeterol (ADVAIR) 250-50 MCG/DOSE AEPB    Sig: Inhale 1 puff into the lungs 2 (two) times daily.    Dispense:  60 each    Refill:  3    Follow-up: Return in about 6 months (around 12/10/2017) for Follow-up on chronic medical conditions.   Charlott Rakes MD

## 2017-06-11 ENCOUNTER — Encounter: Payer: Self-pay | Admitting: Family Medicine

## 2017-06-13 ENCOUNTER — Encounter (HOSPITAL_COMMUNITY): Payer: Medicaid Other

## 2017-06-15 ENCOUNTER — Encounter (HOSPITAL_COMMUNITY): Payer: Medicaid Other

## 2017-06-16 ENCOUNTER — Encounter (INDEPENDENT_AMBULATORY_CARE_PROVIDER_SITE_OTHER): Payer: Medicaid Other | Admitting: Ophthalmology

## 2017-06-16 DIAGNOSIS — I10 Essential (primary) hypertension: Secondary | ICD-10-CM | POA: Diagnosis not present

## 2017-06-16 DIAGNOSIS — H35033 Hypertensive retinopathy, bilateral: Secondary | ICD-10-CM | POA: Diagnosis not present

## 2017-06-16 DIAGNOSIS — E103313 Type 1 diabetes mellitus with moderate nonproliferative diabetic retinopathy with macular edema, bilateral: Secondary | ICD-10-CM

## 2017-06-16 DIAGNOSIS — E10311 Type 1 diabetes mellitus with unspecified diabetic retinopathy with macular edema: Secondary | ICD-10-CM

## 2017-06-16 DIAGNOSIS — H43813 Vitreous degeneration, bilateral: Secondary | ICD-10-CM | POA: Diagnosis not present

## 2017-06-17 ENCOUNTER — Ambulatory Visit (HOSPITAL_COMMUNITY)
Admission: RE | Admit: 2017-06-17 | Discharge: 2017-06-17 | Disposition: A | Discharge status: Home / Self Care | Payer: Medicaid Other | Source: Ambulatory Visit | Attending: Nephrology | Admitting: Nephrology

## 2017-06-17 VITALS — BP 156/95 | HR 102 | Temp 98.6°F | Resp 18 | Ht 65.0 in | Wt 165.0 lb

## 2017-06-17 DIAGNOSIS — N184 Chronic kidney disease, stage 4 (severe): Secondary | ICD-10-CM | POA: Insufficient documentation

## 2017-06-17 LAB — POCT HEMOGLOBIN-HEMACUE: Hemoglobin: 11.8 g/dL — ABNORMAL LOW (ref 12.0–15.0)

## 2017-06-17 MED ORDER — EPOETIN ALFA 20000 UNIT/ML IJ SOLN
20000.0000 [IU] | INTRAMUSCULAR | Status: DC
  Administered 2017-06-17: 20000 [IU] via SUBCUTANEOUS

## 2017-06-17 MED ORDER — EPOETIN ALFA 20000 UNIT/ML IJ SOLN
INTRAMUSCULAR | Status: AC
  Administered 2017-06-17: 20000 [IU] via SUBCUTANEOUS
  Filled 2017-06-17: qty 1

## 2017-06-17 MED ORDER — SODIUM CHLORIDE 0.9 % IV SOLN
510.0000 mg | INTRAVENOUS | Status: DC
  Administered 2017-06-17: 510 mg via INTRAVENOUS
  Filled 2017-06-17: qty 17

## 2017-06-18 ENCOUNTER — Other Ambulatory Visit: Payer: Self-pay

## 2017-06-18 ENCOUNTER — Emergency Department (HOSPITAL_COMMUNITY): Payer: Medicaid Other

## 2017-06-18 ENCOUNTER — Encounter (HOSPITAL_COMMUNITY): Payer: Self-pay | Admitting: Emergency Medicine

## 2017-06-18 ENCOUNTER — Inpatient Hospital Stay (HOSPITAL_COMMUNITY)
Admission: EM | Admit: 2017-06-18 | Discharge: 2017-06-23 | DRG: 291 | Disposition: A | Discharge status: Home / Self Care | Payer: Medicaid Other | Attending: Internal Medicine | Admitting: Internal Medicine

## 2017-06-18 DIAGNOSIS — I5032 Chronic diastolic (congestive) heart failure: Secondary | ICD-10-CM | POA: Diagnosis present

## 2017-06-18 DIAGNOSIS — G629 Polyneuropathy, unspecified: Secondary | ICD-10-CM | POA: Diagnosis present

## 2017-06-18 DIAGNOSIS — F4321 Adjustment disorder with depressed mood: Secondary | ICD-10-CM | POA: Diagnosis present

## 2017-06-18 DIAGNOSIS — E785 Hyperlipidemia, unspecified: Secondary | ICD-10-CM | POA: Diagnosis present

## 2017-06-18 DIAGNOSIS — K3184 Gastroparesis: Secondary | ICD-10-CM | POA: Diagnosis present

## 2017-06-18 DIAGNOSIS — I160 Hypertensive urgency: Secondary | ICD-10-CM | POA: Diagnosis present

## 2017-06-18 DIAGNOSIS — N184 Chronic kidney disease, stage 4 (severe): Secondary | ICD-10-CM | POA: Diagnosis present

## 2017-06-18 DIAGNOSIS — R791 Abnormal coagulation profile: Secondary | ICD-10-CM | POA: Diagnosis present

## 2017-06-18 DIAGNOSIS — T82838A Hemorrhage of vascular prosthetic devices, implants and grafts, initial encounter: Secondary | ICD-10-CM | POA: Diagnosis present

## 2017-06-18 DIAGNOSIS — R9431 Abnormal electrocardiogram [ECG] [EKG]: Secondary | ICD-10-CM | POA: Diagnosis present

## 2017-06-18 DIAGNOSIS — Z880 Allergy status to penicillin: Secondary | ICD-10-CM

## 2017-06-18 DIAGNOSIS — Z955 Presence of coronary angioplasty implant and graft: Secondary | ICD-10-CM

## 2017-06-18 DIAGNOSIS — I5033 Acute on chronic diastolic (congestive) heart failure: Secondary | ICD-10-CM | POA: Diagnosis present

## 2017-06-18 DIAGNOSIS — I491 Atrial premature depolarization: Secondary | ICD-10-CM | POA: Diagnosis present

## 2017-06-18 DIAGNOSIS — E1122 Type 2 diabetes mellitus with diabetic chronic kidney disease: Secondary | ICD-10-CM | POA: Diagnosis present

## 2017-06-18 DIAGNOSIS — Y841 Kidney dialysis as the cause of abnormal reaction of the patient, or of later complication, without mention of misadventure at the time of the procedure: Secondary | ICD-10-CM | POA: Diagnosis present

## 2017-06-18 DIAGNOSIS — IMO0002 Reserved for concepts with insufficient information to code with codable children: Secondary | ICD-10-CM | POA: Diagnosis present

## 2017-06-18 DIAGNOSIS — Z888 Allergy status to other drugs, medicaments and biological substances status: Secondary | ICD-10-CM

## 2017-06-18 DIAGNOSIS — I6522 Occlusion and stenosis of left carotid artery: Secondary | ICD-10-CM | POA: Diagnosis present

## 2017-06-18 DIAGNOSIS — E1142 Type 2 diabetes mellitus with diabetic polyneuropathy: Secondary | ICD-10-CM | POA: Diagnosis present

## 2017-06-18 DIAGNOSIS — I10 Essential (primary) hypertension: Secondary | ICD-10-CM | POA: Diagnosis present

## 2017-06-18 DIAGNOSIS — F329 Major depressive disorder, single episode, unspecified: Secondary | ICD-10-CM | POA: Diagnosis present

## 2017-06-18 DIAGNOSIS — E1143 Type 2 diabetes mellitus with diabetic autonomic (poly)neuropathy: Secondary | ICD-10-CM

## 2017-06-18 DIAGNOSIS — N186 End stage renal disease: Secondary | ICD-10-CM | POA: Diagnosis present

## 2017-06-18 DIAGNOSIS — T8241XA Breakdown (mechanical) of vascular dialysis catheter, initial encounter: Secondary | ICD-10-CM | POA: Diagnosis present

## 2017-06-18 DIAGNOSIS — I132 Hypertensive heart and chronic kidney disease with heart failure and with stage 5 chronic kidney disease, or end stage renal disease: Secondary | ICD-10-CM | POA: Diagnosis present

## 2017-06-18 DIAGNOSIS — D631 Anemia in chronic kidney disease: Secondary | ICD-10-CM | POA: Diagnosis present

## 2017-06-18 DIAGNOSIS — N185 Chronic kidney disease, stage 5: Secondary | ICD-10-CM | POA: Diagnosis not present

## 2017-06-18 DIAGNOSIS — Z79899 Other long term (current) drug therapy: Secondary | ICD-10-CM

## 2017-06-18 DIAGNOSIS — K219 Gastro-esophageal reflux disease without esophagitis: Secondary | ICD-10-CM | POA: Diagnosis present

## 2017-06-18 DIAGNOSIS — J4540 Moderate persistent asthma, uncomplicated: Secondary | ICD-10-CM | POA: Diagnosis present

## 2017-06-18 DIAGNOSIS — R778 Other specified abnormalities of plasma proteins: Secondary | ICD-10-CM | POA: Diagnosis present

## 2017-06-18 DIAGNOSIS — I25119 Atherosclerotic heart disease of native coronary artery with unspecified angina pectoris: Secondary | ICD-10-CM | POA: Diagnosis present

## 2017-06-18 DIAGNOSIS — R748 Abnormal levels of other serum enzymes: Secondary | ICD-10-CM | POA: Diagnosis not present

## 2017-06-18 DIAGNOSIS — Z7902 Long term (current) use of antithrombotics/antiplatelets: Secondary | ICD-10-CM

## 2017-06-18 DIAGNOSIS — J810 Acute pulmonary edema: Secondary | ICD-10-CM | POA: Diagnosis present

## 2017-06-18 DIAGNOSIS — Z7982 Long term (current) use of aspirin: Secondary | ICD-10-CM

## 2017-06-18 DIAGNOSIS — T82898A Other specified complication of vascular prosthetic devices, implants and grafts, initial encounter: Secondary | ICD-10-CM | POA: Diagnosis not present

## 2017-06-18 DIAGNOSIS — R7989 Other specified abnormal findings of blood chemistry: Secondary | ICD-10-CM | POA: Diagnosis present

## 2017-06-18 DIAGNOSIS — R000 Tachycardia, unspecified: Secondary | ICD-10-CM | POA: Diagnosis present

## 2017-06-18 DIAGNOSIS — I259 Chronic ischemic heart disease, unspecified: Secondary | ICD-10-CM | POA: Diagnosis present

## 2017-06-18 DIAGNOSIS — Y712 Prosthetic and other implants, materials and accessory cardiovascular devices associated with adverse incidents: Secondary | ICD-10-CM | POA: Diagnosis present

## 2017-06-18 DIAGNOSIS — E1165 Type 2 diabetes mellitus with hyperglycemia: Secondary | ICD-10-CM | POA: Diagnosis present

## 2017-06-18 DIAGNOSIS — Z794 Long term (current) use of insulin: Secondary | ICD-10-CM

## 2017-06-18 DIAGNOSIS — I5042 Chronic combined systolic (congestive) and diastolic (congestive) heart failure: Secondary | ICD-10-CM | POA: Diagnosis present

## 2017-06-18 DIAGNOSIS — Z86718 Personal history of other venous thrombosis and embolism: Secondary | ICD-10-CM

## 2017-06-18 DIAGNOSIS — R079 Chest pain, unspecified: Secondary | ICD-10-CM | POA: Diagnosis present

## 2017-06-18 DIAGNOSIS — Z87891 Personal history of nicotine dependence: Secondary | ICD-10-CM

## 2017-06-18 DIAGNOSIS — I340 Nonrheumatic mitral (valve) insufficiency: Secondary | ICD-10-CM | POA: Diagnosis not present

## 2017-06-18 LAB — CBG MONITORING, ED
GLUCOSE-CAPILLARY: 174 mg/dL — AB (ref 65–99)
GLUCOSE-CAPILLARY: 249 mg/dL — AB (ref 65–99)

## 2017-06-18 LAB — BASIC METABOLIC PANEL
ANION GAP: 7 (ref 5–15)
BUN: 60 mg/dL — AB (ref 6–20)
CHLORIDE: 109 mmol/L (ref 101–111)
CO2: 21 mmol/L — ABNORMAL LOW (ref 22–32)
Calcium: 8.8 mg/dL — ABNORMAL LOW (ref 8.9–10.3)
Creatinine, Ser: 4.13 mg/dL — ABNORMAL HIGH (ref 0.44–1.00)
GFR, EST AFRICAN AMERICAN: 14 mL/min — AB (ref >60)
GFR, EST NON AFRICAN AMERICAN: 12 mL/min — AB (ref >60)
Glucose, Bld: 197 mg/dL — ABNORMAL HIGH (ref 65–99)
POTASSIUM: 4.6 mmol/L (ref 3.5–5.1)
SODIUM: 137 mmol/L (ref 135–145)

## 2017-06-18 LAB — CBC
HEMATOCRIT: 40.9 % (ref 36.0–46.0)
Hemoglobin: 12.2 g/dL (ref 12.0–15.0)
MCH: 24.6 pg — ABNORMAL LOW (ref 26.0–34.0)
MCHC: 29.8 g/dL — ABNORMAL LOW (ref 30.0–36.0)
MCV: 82.6 fL (ref 78.0–100.0)
PLATELETS: 356 10*3/uL (ref 150–400)
RBC: 4.95 MIL/uL (ref 3.87–5.11)
RDW: 14.4 % (ref 11.5–15.5)
WBC: 9.5 10*3/uL (ref 4.0–10.5)

## 2017-06-18 LAB — I-STAT BETA HCG BLOOD, ED (MC, WL, AP ONLY)

## 2017-06-18 LAB — I-STAT TROPONIN, ED: Troponin i, poc: 0.7 ng/mL (ref 0.00–0.08)

## 2017-06-18 LAB — TROPONIN I: TROPONIN I: 2.46 ng/mL — AB (ref <0.03)

## 2017-06-18 MED ORDER — ACETAMINOPHEN 325 MG PO TABS
650.0000 mg | ORAL_TABLET | ORAL | Status: DC | PRN
  Administered 2017-06-18: 650 mg via ORAL
  Filled 2017-06-18: qty 2

## 2017-06-18 MED ORDER — CLOPIDOGREL BISULFATE 75 MG PO TABS
75.0000 mg | ORAL_TABLET | Freq: Every day | ORAL | Status: DC
  Administered 2017-06-19 – 2017-06-23 (×5): 75 mg via ORAL
  Filled 2017-06-18 (×5): qty 1

## 2017-06-18 MED ORDER — AMLODIPINE BESYLATE 10 MG PO TABS
10.0000 mg | ORAL_TABLET | Freq: Every day | ORAL | Status: DC
  Administered 2017-06-19 – 2017-06-20 (×2): 10 mg via ORAL
  Filled 2017-06-18 (×2): qty 1

## 2017-06-18 MED ORDER — FUROSEMIDE 10 MG/ML IJ SOLN
120.0000 mg | Freq: Two times a day (BID) | INTRAMUSCULAR | Status: DC
  Filled 2017-06-18: qty 12

## 2017-06-18 MED ORDER — MIRTAZAPINE 15 MG PO TBDP
15.0000 mg | ORAL_TABLET | Freq: Every day | ORAL | Status: DC
  Administered 2017-06-19 – 2017-06-20 (×2): 15 mg via ORAL
  Filled 2017-06-18 (×5): qty 1

## 2017-06-18 MED ORDER — NITROGLYCERIN IN D5W 200-5 MCG/ML-% IV SOLN
0.0000 ug/min | Freq: Once | INTRAVENOUS | Status: AC
  Administered 2017-06-18: 5 ug/min via INTRAVENOUS
  Filled 2017-06-18: qty 250

## 2017-06-18 MED ORDER — ALBUTEROL SULFATE (2.5 MG/3ML) 0.083% IN NEBU: 2.5000 mg | INHALATION_SOLUTION | RESPIRATORY_TRACT | Status: DC | PRN

## 2017-06-18 MED ORDER — METOPROLOL SUCCINATE ER 50 MG PO TB24
50.0000 mg | ORAL_TABLET | Freq: Two times a day (BID) | ORAL | Status: DC
  Administered 2017-06-18 – 2017-06-23 (×9): 50 mg via ORAL
  Filled 2017-06-18 (×9): qty 1

## 2017-06-18 MED ORDER — METOCLOPRAMIDE HCL 5 MG PO TABS
5.0000 mg | ORAL_TABLET | Freq: Three times a day (TID) | ORAL | Status: DC
  Administered 2017-06-18 – 2017-06-23 (×17): 5 mg via ORAL
  Filled 2017-06-18 (×18): qty 1

## 2017-06-18 MED ORDER — ASPIRIN 81 MG PO CHEW
81.0000 mg | CHEWABLE_TABLET | Freq: Every day | ORAL | Status: DC
  Administered 2017-06-19 – 2017-06-23 (×5): 81 mg via ORAL
  Filled 2017-06-18 (×5): qty 1

## 2017-06-18 MED ORDER — GABAPENTIN 300 MG PO CAPS
300.0000 mg | ORAL_CAPSULE | Freq: Two times a day (BID) | ORAL | Status: DC
  Administered 2017-06-19 – 2017-06-21 (×5): 300 mg via ORAL
  Filled 2017-06-18 (×10): qty 1

## 2017-06-18 MED ORDER — CALCITRIOL 0.25 MCG PO CAPS
0.2500 ug | ORAL_CAPSULE | Freq: Every day | ORAL | Status: DC
Start: 2017-06-19 — End: 2017-06-23
  Administered 2017-06-19 – 2017-06-22 (×4): 0.25 ug via ORAL
  Filled 2017-06-18 (×4): qty 1

## 2017-06-18 MED ORDER — PANTOPRAZOLE SODIUM 40 MG PO TBEC
40.0000 mg | DELAYED_RELEASE_TABLET | Freq: Every day | ORAL | Status: DC
  Administered 2017-06-19: 40 mg via ORAL
  Filled 2017-06-18: qty 1

## 2017-06-18 MED ORDER — INSULIN ASPART 100 UNIT/ML ~~LOC~~ SOLN
0.0000 [IU] | Freq: Every day | SUBCUTANEOUS | Status: DC
  Administered 2017-06-18: 2 [IU] via SUBCUTANEOUS
  Filled 2017-06-18: qty 1

## 2017-06-18 MED ORDER — FUROSEMIDE 10 MG/ML IJ SOLN
80.0000 mg | Freq: Once | INTRAMUSCULAR | Status: AC
  Administered 2017-06-18: 80 mg via INTRAVENOUS
  Filled 2017-06-18: qty 8

## 2017-06-18 MED ORDER — HEPARIN SODIUM (PORCINE) 5000 UNIT/ML IJ SOLN
5000.0000 [IU] | Freq: Three times a day (TID) | INTRAMUSCULAR | Status: DC
  Administered 2017-06-18: 5000 [IU] via SUBCUTANEOUS
  Filled 2017-06-18: qty 1

## 2017-06-18 MED ORDER — INSULIN GLARGINE 100 UNIT/ML ~~LOC~~ SOLN
30.0000 [IU] | Freq: Two times a day (BID) | SUBCUTANEOUS | Status: DC
  Filled 2017-06-18 (×2): qty 0.3

## 2017-06-18 MED ORDER — CYCLOBENZAPRINE HCL 10 MG PO TABS: 5.0000 mg | ORAL_TABLET | Freq: Every evening | ORAL | Status: DC | PRN

## 2017-06-18 MED ORDER — ONDANSETRON HCL 4 MG/2ML IJ SOLN: 4.0000 mg | Freq: Four times a day (QID) | INTRAMUSCULAR | Status: DC | PRN

## 2017-06-18 MED ORDER — DULOXETINE HCL 60 MG PO CPEP
60.0000 mg | ORAL_CAPSULE | Freq: Every day | ORAL | Status: DC
  Administered 2017-06-20 – 2017-06-21 (×2): 60 mg via ORAL
  Filled 2017-06-18 (×6): qty 1

## 2017-06-18 MED ORDER — PRAVASTATIN SODIUM 20 MG PO TABS
20.0000 mg | ORAL_TABLET | Freq: Every day | ORAL | Status: DC
  Administered 2017-06-19 – 2017-06-22 (×4): 20 mg via ORAL
  Filled 2017-06-18 (×4): qty 1

## 2017-06-18 MED ORDER — SODIUM CHLORIDE 0.9% FLUSH
3.0000 mL | INTRAVENOUS | Status: DC | PRN
  Administered 2017-06-18: 3 mL via INTRAVENOUS

## 2017-06-18 MED ORDER — CHOLECALCIFEROL 10 MCG (400 UNIT) PO TABS
400.0000 [IU] | ORAL_TABLET | Freq: Every day | ORAL | Status: DC
  Administered 2017-06-19 – 2017-06-23 (×5): 400 [IU] via ORAL
  Filled 2017-06-18 (×5): qty 1

## 2017-06-18 MED ORDER — INSULIN ASPART 100 UNIT/ML ~~LOC~~ SOLN
0.0000 [IU] | Freq: Three times a day (TID) | SUBCUTANEOUS | Status: DC
  Administered 2017-06-19 (×2): 3 [IU] via SUBCUTANEOUS
  Administered 2017-06-20: 5 [IU] via SUBCUTANEOUS
  Administered 2017-06-20: 2 [IU] via SUBCUTANEOUS
  Administered 2017-06-20 – 2017-06-22 (×4): 3 [IU] via SUBCUTANEOUS
  Administered 2017-06-23: 1 [IU] via SUBCUTANEOUS
  Administered 2017-06-23: 3 [IU] via SUBCUTANEOUS
  Filled 2017-06-18 (×2): qty 1

## 2017-06-18 MED ORDER — ASPIRIN 81 MG PO CHEW
324.0000 mg | CHEWABLE_TABLET | Freq: Once | ORAL | Status: AC
  Administered 2017-06-19: 324 mg via ORAL
  Filled 2017-06-18: qty 4

## 2017-06-18 MED ORDER — SODIUM CHLORIDE 0.9% FLUSH
3.0000 mL | Freq: Two times a day (BID) | INTRAVENOUS | Status: DC
  Administered 2017-06-19 – 2017-06-23 (×9): 3 mL via INTRAVENOUS

## 2017-06-18 MED ORDER — HEPARIN (PORCINE) IN NACL 100-0.45 UNIT/ML-% IJ SOLN
900.0000 [IU]/h | INTRAMUSCULAR | Status: DC
  Administered 2017-06-19: 900 [IU]/h via INTRAVENOUS
  Filled 2017-06-18: qty 250

## 2017-06-18 MED ORDER — MOMETASONE FURO-FORMOTEROL FUM 200-5 MCG/ACT IN AERO
2.0000 | INHALATION_SPRAY | Freq: Two times a day (BID) | RESPIRATORY_TRACT | Status: DC
  Administered 2017-06-19 – 2017-06-22 (×4): 2 via RESPIRATORY_TRACT
  Filled 2017-06-18 (×2): qty 8.8

## 2017-06-18 MED ORDER — NORTRIPTYLINE HCL 10 MG PO CAPS
30.0000 mg | ORAL_CAPSULE | Freq: Every day | ORAL | Status: DC
  Administered 2017-06-19 – 2017-06-20 (×2): 30 mg via ORAL
  Filled 2017-06-18 (×5): qty 3

## 2017-06-18 MED ORDER — CALCIFEDIOL ER 30 MCG PO CPCR: 30.0000 ug | ORAL_CAPSULE | Freq: Every day | ORAL | Status: DC

## 2017-06-18 MED ORDER — HYDRALAZINE HCL 20 MG/ML IJ SOLN
10.0000 mg | INTRAMUSCULAR | Status: DC | PRN
  Administered 2017-06-18: 10 mg via INTRAVENOUS
  Filled 2017-06-18: qty 1

## 2017-06-18 MED ORDER — NITROGLYCERIN IN D5W 200-5 MCG/ML-% IV SOLN
0.0000 ug/min | INTRAVENOUS | Status: DC
  Administered 2017-06-18: 25 ug/min via INTRAVENOUS

## 2017-06-18 MED ORDER — SODIUM CHLORIDE 0.9 % IV SOLN: 250.0000 mL | INTRAVENOUS | Status: DC | PRN

## 2017-06-18 NOTE — ED Notes (Signed)
Pt received food and beverage upon arrival to [facility].

## 2017-06-18 NOTE — ED Notes (Addendum)
Pt ambulated to and from the bathroom with no pain or difficulty.

## 2017-06-18 NOTE — ED Notes (Signed)
Elevated tro called to dr Francia Greaves will go back next

## 2017-06-18 NOTE — ED Triage Notes (Signed)
Pt presents to ED for assessment of SOB x multiple months, worsening last night to the point where the patient could not lay down last night to sleep.  Patient c/o gastroparesis with nausea and vomiting all the time.  Hx of CKD IV, DM and HTN as well.

## 2017-06-18 NOTE — ED Notes (Signed)
Pt was given a sandwich bag and drink per provider

## 2017-06-18 NOTE — H&P (Signed)
History and Physical    Susan Horton WUJ:[id number] DOB: [birth year] DOA: 06/18/2017  PCP: Charlott Rakes, MD   Patient coming from: Home   Chief Complaint: Chest discomfort, SOB, orthopnea   HPI: Susan Horton is a 47 y.o. female with medical history significant for coronary artery disease with stent, chronic kidney disease stage IV, chronic diastolic CHF, asthma, depression, hypertension, and insulin-dependent diabetes mellitus, now presenting to the emergency department for evaluation of shortness of breath, chest discomfort, and orthopnea.  Patient reports that she has been experiencing some mild chest discomfort and shortness of breath for several months, but this has worsened significantly in recent days, particularly over the past 24 hours.  She describes the chest discomfort as a pressure sensation, constant, localized to the central chest, and without alleviating or exacerbating factors identified.  She denies fevers, chills, or productive cough.  Reports that her dyspnea has worsened significantly over the past day and she was unable to lie flat last night.  She denies swelling or tenderness in either lower extremity.  ED Course: Upon arrival to the ED, patient is found to be in no frank edema.  Chemistry panel reveals a bicarbonate of 21, BUN 60, and creatinine 4.13, up from 3.7 in March.  Afebrile, saturating adequately on room air, mildly tachypneic, mildly tachycardic, and with blood pressure 186/110.  EKG features a sinus tachycardia with rate 112, PACs, and ST-T abnormality.  Chest x-ray is notable for slightly low lung volumes, top normal heart size, CBC is unremarkable.  Troponin is elevated to 0.70.  Cardiology was consulted by the ED physician, evaluated the patient in the emergency department, suspects this is secondary to hypervolemia rather than acute coronary event, and recommends a medical admission.  Patient was treated with 80 mg IV Lasix in the emergency department and started  on nitroglycerin infusion.  She will be admitted for ongoing evaluation and management of acute on chronic diastolic CHF with elevated troponin, suspected type II non-STEMI.   Review of Systems:  All other systems reviewed and apart from HPI, are negative.  Past Medical History:  Diagnosis Date  . Anemia   . Asthma   . CAD (coronary artery disease)    DES to mid LAD July 2018, residual moderate RCA disease  . Cataract   . CKD (chronic kidney disease) stage 4, GFR 15-29 ml/min (HCC)   . DVT (deep venous thrombosis) ([place])    1996 during pregnancy, 2015 left leg  . Essential hypertension 12/11/2015  . Gastroparesis   . GERD (gastroesophageal reflux disease)   . Hidradenitis   . Migraine   . Neuropathy   . Type 2 diabetes mellitus ([place])   . Type II diabetes mellitus ([place])     Past Surgical History:  Procedure Laterality Date  . ABDOMINOPLASTY    . APPENDECTOMY  1995  . AV FISTULA PLACEMENT Left 02/01/2017   Procedure: ARTERIOVENOUS BRACHIOCEPHALIC (AV) FISTULA CREATION;  Surgeon: [name], MD;  Location: [facility];  Service: Vascular;  Laterality: Left;  . CHOLECYSTECTOMY  1993  . CORONARY ANGIOPLASTY WITH STENT PLACEMENT    . CORONARY STENT INTERVENTION N/A 08/05/2016   Procedure: Coronary Stent Intervention;  Surgeon: [name], MD;  Location: [facility];  Service: Cardiovascular;  Laterality: N/A;  . EXPLORATORY LAPAROTOMY  08/14/2005   lysis of adhesions, drainage of tubo-ovarian abscess  . HYDRADENITIS EXCISION  02/08/2011   Procedure: EXCISION HYDRADENITIS GROIN;  Surgeon: [name], MD;  Location: [facility];  Service: General;  Laterality: N/A;  Excisioin of Hidradenitis Left groin  . INGUINAL HIDRADENITIS EXCISION  07/06/2010   bilateral  . LEFT HEART CATH AND CORONARY ANGIOGRAPHY N/A 08/05/2016   Procedure: Left Heart Cath and Coronary Angiography;  Surgeon: [name], MD;  Location: [facility];  Service:  Cardiovascular;  Laterality: N/A;  . REDUCTION MAMMAPLASTY  2002  . TONSILLECTOMY    . TUBAL LIGATION  1996  . VAGINAL HYSTERECTOMY  08/04/2005   and cysto     reports that she quit smoking about 7 months ago. Her smoking use included cigarettes. She quit after 20.00 years of use. She has never used smokeless tobacco. She reports that she does not drink alcohol or use drugs.  Allergies  Allergen Reactions  . Penicillins Anaphylaxis, Itching, Swelling, Rash and Other (See Comments)    Swelling of throat & whole mouth  Has patient had a PCN reaction causing immediate rash, facial/tongue/throat swelling, SOB or lightheadedness with hypotension: Yes Has patient had a PCN reaction causing severe rash involving mucus membranes or skin necrosis: No Has patient had a PCN reaction that required hospitalization: No Has patient had a PCN reaction occurring within the last 10 years: No If all of the above answers are "NO", then may proceed with Cephalosporin use.  [name] Lisinopril Cough    Family History  Problem Relation Age of Onset  . Hypertension Father   . Hypertension Sister   . Hypertension Brother   . Breast cancer Cousin        x2     Prior to Admission medications   Medication Sig Start Date End Date Taking? Authorizing Provider  albuterol (PROVENTIL HFA;VENTOLIN HFA) 108 (90 BASE) MCG/ACT inhaler Inhale 1-2 puffs into the lungs every 6 (six) hours as needed for wheezing or shortness of breath.    Yes [provider]  amLODipine (NORVASC) 10 MG tablet TAKE 1 TABLET BY MOUTH DAILY 03/18/17  Yes [name], MD  aspirin 81 MG chewable tablet Chew 1 tablet (81 mg total) by mouth daily. 08/07/16  Yes Cheryln Manly, NP  Calcifediol ER (RAYALDEE) 30 MCG CPCR Take 30 mcg by mouth daily.    Yes [provider]  calcitRIOL (ROCALTROL) 0.25 MCG capsule Take 0.25 mcg by mouth daily. 05/26/16  Yes [provider]  Cholecalciferol (VITAMIN D-3 PO) Take 1  capsule by mouth daily.   Yes [provider]  clopidogrel (PLAVIX) 75 MG tablet Take 1 tablet (75 mg total) by mouth daily with breakfast. 03/23/17  Yes Almyra Deforest, PA  cyclobenzaprine (FLEXERIL) 5 MG tablet Take 1 tablet (5 mg total) by mouth at bedtime as needed for muscle spasms. 01/05/17  Yes Patel, Donika K, DO  DULoxetine (CYMBALTA) 60 MG capsule Take 1 capsule (60 mg total) by mouth daily. 12/08/16  Yes [name], MD  Ferrous Sulfate (IRON) 325 (65 Fe) MG TABS Take 325 mg by mouth daily. 11/25/15  Yes Noel, Tiffany S, PA-C  Fluticasone-Salmeterol (ADVAIR) 250-50 MCG/DOSE AEPB Inhale 1 puff into the lungs 2 (two) times daily. 06/10/17  Yes Charlott Rakes, MD  furosemide (LASIX) 80 MG tablet Take 160 mg by mouth 2 (two) times daily. 06/10/17  Yes [provider]  gabapentin (NEURONTIN) 300 MG capsule TAKE 2 CAPSULES BY MOUTH 3 TIMES DAILY. Patient taking differently: Take 600 mg by mouth two times a day 03/18/17  Yes Jegede, Olugbemiga E, MD  Insulin Glargine (TOUJEO SOLOSTAR) 300 UNIT/ML SOPN  Inject 60 Units into the skin 2 (two) times daily. 05/23/17  Yes Philemon Kingdom, MD  insulin regular (HUMULIN R) 100 units/mL injection Inject 25-35 into the skin 3 (three) times daily before meals. Sliding scale Patient taking differently: Inject 25-35 Units into the skin See admin instructions. Inject 25-35 units into the skin 3 (three) times daily before meals, per sliding scale 05/23/17  Yes Philemon Kingdom, MD  lidocaine (XYLOCAINE) 5 % ointment Apply to feet twice daily as needed for severe pain.  Use gloves. 06/03/17  Yes Patel, Donika K, DO  losartan (COZAAR) 100 MG tablet TAKE 1 TABLET BY MOUTH DAILY 03/18/17  Yes Jegede, Olugbemiga E, MD  lovastatin (MEVACOR) 20 MG tablet Take 20 mg by mouth at bedtime.    Yes [provider]  metoCLOPramide (REGLAN) 5 MG tablet TAKE 1 TABLET BY MOUTH 4 TIMES DAILY BEFORE MEALS AND AT BEDTIME. 03/18/17  Yes Jegede, Olugbemiga E, MD    metoprolol succinate (TOPROL-XL) 50 MG 24 hr tablet Take 50 mg by mouth 2 (two) times daily. 06/08/17  Yes [provider]  mirtazapine (REMERON SOL-TAB) 15 MG disintegrating tablet TAKE 1 TABLET BY MOUTH AT BEDTIME. 03/18/17  Yes Jegede, Olugbemiga E, MD  nitroGLYCERIN (NITROSTAT) 0.4 MG SL tablet Place 1 tablet (0.4 mg total) under the tongue every 5 (five) minutes as needed. Patient taking differently: Place 0.4 mg under the tongue every 5 (five) minutes as needed for chest pain.  08/06/16  Yes Reino Bellis B, NP  nortriptyline (PAMELOR) 10 MG capsule Take 3 tablet at bedtime. 06/03/17  Yes Patel, Donika K, DO  pantoprazole (PROTONIX) 40 MG tablet TAKE 1 TABLET BY MOUTH DAILY. 03/18/17  Yes [name], MD  senna-docusate (SENOKOT-S) 8.6-50 MG tablet Take 1 tablet by mouth 2 (two) times daily. Patient taking differently: Take 1 tablet by mouth 2 (two) times daily as needed for mild constipation.  08/30/16  Yes Vann, Jessica U, DO  trimethoprim-polymyxin b (POLYTRIM) ophthalmic solution INSTILL ONE DROP INTO BOTH EYES 4 TIMES A DAY FOR 2 DAYS AFTER EACH MONTHLY EYE INJECTION 06/16/17  Yes [provider]  Cholecalciferol (VITAMIN D PO) Take 1 capsule by mouth daily.    [provider]  metoprolol succinate (TOPROL-XL) 100 MG 24 hr tablet Take half tab in the morning and whole tab in the pm; Take with or immediately following a meal. Patient not taking: Reported on 06/18/2017 03/22/17   Almyra Deforest, PA  montelukast (SINGULAIR) 10 MG tablet Take 10 mg by mouth daily as needed (allergies).     [provider]  ondansetron (ZOFRAN) 4 MG tablet Take 1 tablet (4 mg total) by mouth daily as needed for nausea or vomiting. 08/30/16 08/30/17  Geradine Girt, DO  oxyCODONE-acetaminophen (PERCOCET) 5-325 MG tablet Take 1 tablet by mouth every 6 (six) hours as needed for severe pain. Patient not taking: Reported on 06/18/2017 02/01/17   Gabriel Earing, PA-C    Physical  Exam: Vitals:   06/18/17 1715 06/18/17 1725 06/18/17 1800 06/18/17 1900  BP:  (!) 116/91 (!) 159/108 (!) 186/110  Pulse: (!) 106 (!) 106 (!) 106 (!) 109  Resp: 19 (!) 23 (!) 25 17  Temp:      TempSrc:      SpO2: 100% 99% 99% 99%      Constitutional: Not in acute distress, tachypneic, no pallor, no diaphoresis  Eyes: PERTLA, lids and conjunctivae normal ENMT: Mucous membranes are moist. Posterior pharynx clear of any exudate or  lesions.   Neck: normal, supple, no masses, no thyromegaly Respiratory: Mild tachypnea at rest. Dyspnea with speech. Fine rales bilaterally.  No accessory muscle use.  Cardiovascular: S1 & S2 heard, regular rate and rhythms. No extremity edema. JVP 9 cmH2O. AVF in LUE with bruit and thrill.  Abdomen: No distension, no tenderness, soft. Bowel sounds normal.  Musculoskeletal: no clubbing / cyanosis. No joint deformity upper and lower extremities.   Skin: no significant rashes, lesions, ulcers. Warm, dry, well-perfused. Neurologic: CN 2-12 grossly intact. Sensation intact. Strength 5/5 in all 4 limbs.  Psychiatric: Alert and oriented x 3. Calm, cooperative.     Labs on Admission: I have personally reviewed following labs and imaging studies  CBC: Recent Labs  Lab 06/17/17 1311 06/18/17 1443  WBC  --  9.5  HGB 11.8* 12.2  HCT  --  40.9  MCV  --  82.6  PLT  --  562   Basic Metabolic Panel: Recent Labs  Lab 06/18/17 1443  NA 137  K 4.6  CL 109  CO2 21*  GLUCOSE 197*  BUN 60*  CREATININE 4.13*  CALCIUM 8.8*   GFR: Estimated Creatinine Clearance: 17.2 mL/min (A) (by C-G formula based on SCr of 4.13 mg/dL (H)). Liver Function Tests: No results for input(s): AST, ALT, ALKPHOS, BILITOT, PROT, ALBUMIN in the last 168 hours. No results for input(s): LIPASE, AMYLASE in the last 168 hours. No results for input(s): AMMONIA in the last 168 hours. Coagulation Profile: No results for input(s): INR, PROTIME in the last 168 hours. Cardiac Enzymes: No  results for input(s): CKTOTAL, CKMB, CKMBINDEX, TROPONINI in the last 168 hours. BNP (last 3 results) No results for input(s): PROBNP in the last 8760 hours. HbA1C: No results for input(s): HGBA1C in the last 72 hours. CBG: Recent Labs  Lab 06/18/17 1732  GLUCAP 174*   Lipid Profile: No results for input(s): CHOL, HDL, LDLCALC, TRIG, CHOLHDL, LDLDIRECT in the last 72 hours. Thyroid Function Tests: No results for input(s): TSH, T4TOTAL, FREET4, T3FREE, THYROIDAB in the last 72 hours. Anemia Panel: No results for input(s): VITAMINB12, FOLATE, FERRITIN, TIBC, IRON, RETICCTPCT in the last 72 hours. Urine analysis:    Component Value Date/Time   COLORURINE YELLOW 03/17/2017 0034   APPEARANCEUR CLEAR 03/17/2017 0034   LABSPEC 1.011 03/17/2017 0034   PHURINE 6.0 03/17/2017 0034   GLUCOSEU >=500 (A) 03/17/2017 0034   HGBUR NEGATIVE 03/17/2017 0034   BILIRUBINUR NEGATIVE 03/17/2017 0034   BILIRUBINUR negative 01/13/2016 1135   KETONESUR NEGATIVE 03/17/2017 0034   PROTEINUR >=300 (A) 03/17/2017 0034   UROBILINOGEN 0.2 01/13/2016 1135   UROBILINOGEN 0.2 06/20/2014 1120   NITRITE NEGATIVE 03/17/2017 0034   LEUKOCYTESUR NEGATIVE 03/17/2017 0034   Sepsis Labs: @LABRCNTIP (procalcitonin:4,lacticidven:4) )No results found for this or any previous visit (from the past 240 hour(s)).   Radiological Exams on Admission: Dg Chest 2 View  Result Date: 06/18/2017 CLINICAL DATA:  Dyspnea EXAM: CHEST - 2 VIEW COMPARISON:  03/16/2017 chest radiograph. FINDINGS: Slightly low lung volumes. Stable cardiomediastinal silhouette with top-normal heart size. No pneumothorax. Trace bilateral pleural effusions. No overt pulmonary edema. No acute consolidative airspace disease. Cholecystectomy clips are seen in the right upper quadrant of the abdomen. IMPRESSION: Slightly low lung volumes. Top-normal heart size. No overt pulmonary edema. Trace bilateral pleural effusions. Electronically Signed   By: Ilona Sorrel  M.D.   On: 06/18/2017 15:16    EKG: Independently Sinus tachycardia (rate 112), PAC's, ST-T abnormalities.   Assessment/Plan   1. Acute on  chronic diastolic CHF  - Presents with progressive chest discomfort with SOB and orthopnea  - Found to have troponin of 0.70, JVD, rales  - Treated with 80 mg IV Lasix in ED without response; she takes 160 mg PO Lasix BID at home and will be given another Lasix 80 IV on admission, then continue diuresis with 120 mg IV q12h  - Continue cardiac monitoring, SLIV, follow daily wt and I/O's, update echo, continue beta-blocker, hold ARB in light of increased creatinine   2. Chest discomfort; CAD; elevated troponin  - Presents with discomfort in central chest, SOB, orthopnea, and is found to have troponin of 0.70  - Cardiology has evaluated the patient in ED and feels this likely reflects volume o/l rather than acute coronary event (type II NSTEMI)  - Continue cardiac monitoring, trend troponin, continue ASA, Plavix, and beta-blocker, check echocardiogram   3. CKD IV  - SCr is 4.13 on admission, up from 3.7 in March  - Has fistula in LUE, created 02/01/17, not yet on HD  - There is hypervolemia and hypertension on admission, but no hyperkalemia or significant acidosis   - Diuresing as above, follow daily chem panel during diuresis, renally-dose medications   4. Insulin-dependent DM  - A1c was 8.5% last month  - Managed at home with Toujeo 60 units BID and Humulin R 25-35 TID   - Follow CBG's, start with Lantus 30 units BID and Novolog sliding-scale while inpatient  - Continue Reglan for associated gastroparesis    5. Hypertension with hypertensive urgency  - BP elevated to 186/110 in ED  - Anticipate improvement with diuresis  - Continue Norvasc, metoprolol, diuresis, NTG, and hydralazine IVP's prn    6. Depression  - Stable  - Continue Remeron, Cymbalta, nortriptyline     7. Asthma  - No wheezing on admission  - Continue ICS/LABA and prn  albuterol     DVT prophylaxis: sq heparin  Code Status: Full  Family Communication: Discussed with patient Consults called: Cardiology Admission status: Inpatient    Vianne Bulls, MD [facility] Pager [phone]  If 7PM-7AM, please contact night-coverage [email or web address] Password [facility]  06/18/2017, 7:52 PM

## 2017-06-18 NOTE — Progress Notes (Signed)
ANTICOAGULATION CONSULT NOTE - Initial Consult  Pharmacy Consult for Heparin  Indication: chest pain/ACS  Allergies  Allergen Reactions  . Penicillins Anaphylaxis, Itching, Swelling, Rash and Other (See Comments)    Swelling of throat & whole mouth  Has patient had a PCN reaction causing immediate rash, facial/tongue/throat swelling, SOB or lightheadedness with hypotension: Yes Has patient had a PCN reaction causing severe rash involving mucus membranes or skin necrosis: No Has patient had a PCN reaction that required hospitalization: No Has patient had a PCN reaction occurring within the last 10 years: No If all of the above answers are "NO", then may proceed with Cephalosporin use.  [name] Lisinopril Cough   Patient Measurements: Height: 5\' 5"  (165.1 cm) Weight: 165 lb (74.8 kg) IBW/kg (Calculated) : 57  Vital Signs: Temp: 98.1 F (36.7 C) (06/08 1434) Temp Source: Oral (06/08 1434) BP: 139/70 (06/08 2300) Pulse Rate: 94 (06/08 2300)  Labs: Recent Labs    06/17/17 1311 06/18/17 1443 06/18/17 2244  HGB 11.8* 12.2  --   HCT  --  40.9  --   PLT  --  356  --   CREATININE  --  4.13*  --   TROPONINI  --   --  2.46*    Estimated Creatinine Clearance: 17.2 mL/min (A) (by C-G formula based on SCr of 4.13 mg/dL (H)).   Medical History: Past Medical History:  Diagnosis Date  . Anemia   . Asthma   . CAD (coronary artery disease)    DES to mid LAD July 2018, residual moderate RCA disease  . Cataract   . CKD (chronic kidney disease) stage 4, GFR 15-29 ml/min (HCC)   . DVT (deep venous thrombosis) ([place])    1996 during pregnancy, 2015 left leg  . Essential hypertension 12/11/2015  . Gastroparesis   . GERD (gastroesophageal reflux disease)   . Hidradenitis   . Migraine   . Neuropathy   . Type 2 diabetes mellitus ([place])   . Type II diabetes mellitus (HCC)     Assessment: 47 y/o F with chest pain to start heparin, troponin elevated, CBC good, noted renal dysfunction, PTA  meds reviewed, hx DVTs, no current anti-coagulants noted.   Goal of Therapy:  Heparin level 0.3-0.7 units/ml Monitor platelets by anticoagulation protocol: Yes   Plan:  -No bolus with recent subcutaneous heparin  -Start heparin drip at 900 units/hr -0800 HL -Daily CBC/HL -Monitor for bleeding  Narda Bonds 06/18/2017,11:40 PM

## 2017-06-18 NOTE — ED Provider Notes (Signed)
[facility] Provider Note   CSN: [id number] Arrival date & time: 06/18/17  1429     History   Chief Complaint Chief Complaint  Patient presents with  . Chest Pain  . Emesis    HPI Susan Horton is a 47 y.o. female.  HPI 47 yo female ho t2dm, cad presents with increased chest pain since last night.  Worsens with lying flat.  Baseline pain 5/10 but now 10/10.  Recent dialysis access place left arm but no dialysis yet.  She did not take any intervention at home. She has history of dvt ans is not on blood thinners now. Denies recent illness, cough, or fever, or lateralized leg swelling.  From Dr. Kennon Holter note  HPI:  Susan Horton is a 47 y.o. moderately overweight single African-American female mother of 10, grandmother and 4 grandchildren who was seen by Kerin Ransom on 06/15/16 because of new onset chest pain. I last saw her in the office 07/20/16 She has a history of treated hypertension, hyperlipidemia and diabetes which she's had for the last 22 years. She has CKD 3  with creatinine running in the mid 3 range followed by Dr. Jimmy Footman. She's noticed chest pain over the last 3-4 months occurring several times a week lasting minutes at a time worse with exertion. She had a Myoview stress test was read as intermediate risk with inferior ischemia. Based on this, I decided to proceed with outpatient radial diagnostic coronary angiography using limited contrast. This was performed 08/05/16 the right femoral approach revealed an 80% mid LAD which was stented using a synergy drug-eluting stent reducing 80% stenosis to 0% residual. She did have moderate mid RCA stenosis which was not addressed. She had left main spasm probably related to the guide catheter doing a procedure that responded to intra-coronary nitroglycerin. Her chest pain has resolved and she has stopped smoking since. She did have carotid Dopplers performed because of bruits that showed mild to moderate  left ICA stenosis which will be followed on an annual basis.     Past Medical History:  Diagnosis Date  . Anemia   . Asthma   . CAD (coronary artery disease)    DES to mid LAD July 2018, residual moderate RCA disease  . Cataract   . CKD (chronic kidney disease) stage 4, GFR 15-29 ml/min (HCC)   . DVT (deep venous thrombosis) ([place])    1996 during pregnancy, 2015 left leg  . Essential hypertension 12/11/2015  . Gastroparesis   . GERD (gastroesophageal reflux disease)   . Hidradenitis   . Migraine   . Neuropathy   . Type 2 diabetes mellitus ([place])   . Type II diabetes mellitus [facility])     Patient Active Problem List   Diagnosis Date Noted  . Moderate persistent asthma 06/10/2017  . Adjustment disorder with depressed mood 09/08/2016  . Medication management 09/08/2016  . Chest pain 08/29/2016  . CKD (chronic kidney disease) stage 4, GFR 15-29 ml/min (HCC)   . CKD (chronic kidney disease), stage III: followed by Dr. Jimmy Footman 08/06/2016  . CAD S/P mLAD PCI with DES 08/06/2016  . Presence of drug coated stent in LAD coronary artery 08/06/2016  . Coronary artery disease involving native coronary artery of native heart with angina pectoris ([place]) 08/05/2016  . Abnormal stress test   . Carotid bruit present 06/15/2016  . Dyslipidemia 06/15/2016  . Smoker 06/15/2016  . Neuropathy 03/10/2016  . Essential hypertension, benign 12/11/2015  . Gastroparesis   .  Progressive angina ([place]) 06/05/2014  . Intractable nausea and vomiting 06/05/2014  . Uncontrolled type 2 diabetes mellitus with peripheral neuropathy ([place]) 06/05/2014  . Normocytic anemia 06/05/2014  . Hyperglycemia   . Vulvovaginal candidiasis 07/10/2010    Past Surgical History:  Procedure Laterality Date  . ABDOMINOPLASTY    . APPENDECTOMY  1995  . AV FISTULA PLACEMENT Left 02/01/2017   Procedure: ARTERIOVENOUS BRACHIOCEPHALIC (AV) FISTULA CREATION;  Surgeon: [name], MD;  Location: [facility];  Service: Vascular;   Laterality: Left;  . CHOLECYSTECTOMY  1993  . CORONARY ANGIOPLASTY WITH STENT PLACEMENT    . CORONARY STENT INTERVENTION N/A 08/05/2016   Procedure: Coronary Stent Intervention;  Surgeon: [name], MD;  Location: [facility] CV LAB;  Service: Cardiovascular;  Laterality: N/A;  . EXPLORATORY LAPAROTOMY  08/14/2005   lysis of adhesions, drainage of tubo-ovarian abscess  . HYDRADENITIS EXCISION  02/08/2011   Procedure: EXCISION HYDRADENITIS GROIN;  Surgeon: [name], MD;  Location: [facility];  Service: General;  Laterality: N/A;  Excisioin of Hidradenitis Left groin  . INGUINAL HIDRADENITIS EXCISION  07/06/2010   bilateral  . LEFT HEART CATH AND CORONARY ANGIOGRAPHY N/A 08/05/2016   Procedure: Left Heart Cath and Coronary Angiography;  Surgeon: [name], MD;  Location: [facility] CV LAB;  Service: Cardiovascular;  Laterality: N/A;  . REDUCTION MAMMAPLASTY  2002  . TONSILLECTOMY    . TUBAL LIGATION  1996  . VAGINAL HYSTERECTOMY  08/04/2005   and cysto     OB History   None      Home Medications    Prior to Admission medications   Medication Sig Start Date End Date Taking? Authorizing Provider  albuterol (PROVENTIL HFA;VENTOLIN HFA) 108 (90 BASE) MCG/ACT inhaler Inhale 1-2 puffs into the lungs every 6 (six) hours as needed for wheezing or shortness of breath.     [provider]  amLODipine (NORVASC) 10 MG tablet TAKE 1 TABLET BY MOUTH DAILY 03/18/17   [name], MD  aspirin 81 MG chewable tablet Chew 1 tablet (81 mg total) by mouth daily. 08/07/16   Cheryln Manly, NP  Calcifediol ER (RAYALDEE) 30 MCG CPCR Take 30 mcg by mouth daily.     [provider]  calcitRIOL (ROCALTROL) 0.25 MCG capsule Take 0.25 mcg by mouth daily. 05/26/16   [provider]  Cholecalciferol (VITAMIN D PO) Take 1 capsule by mouth daily.    [provider]  clopidogrel (PLAVIX) 75 MG tablet Take 1 tablet (75 mg total) by mouth  daily with breakfast. 03/23/17   Almyra Deforest, PA  cyclobenzaprine (FLEXERIL) 5 MG tablet Take 1 tablet (5 mg total) by mouth at bedtime as needed for muscle spasms. 01/05/17   Narda Amber K, DO  DULoxetine (CYMBALTA) 60 MG capsule Take 1 capsule (60 mg total) by mouth daily. 12/08/16   [name], MD  Ferrous Sulfate (IRON) 325 (65 Fe) MG TABS Take 325 mg by mouth daily. 11/25/15   Brayton Caves, PA-C  Fluticasone-Salmeterol (ADVAIR) 250-50 MCG/DOSE AEPB Inhale 1 puff into the lungs 2 (two) times daily. 06/10/17   Charlott Rakes, MD  furosemide (LASIX) 40 MG tablet Take 40 mg by mouth daily. 03/18/17   [provider]  gabapentin (NEURONTIN) 300 MG capsule TAKE 2 CAPSULES BY MOUTH 3 TIMES DAILY. 03/18/17   [name], MD  Insulin Glargine (TOUJEO SOLOSTAR) 300 UNIT/ML SOPN Inject 60 Units into the skin 2 (two) times daily. 05/23/17  Philemon Kingdom, MD  insulin regular (HUMULIN R) 100 units/mL injection Inject 25-35 into the skin 3 (three) times daily before meals. Sliding scale 05/23/17   Philemon Kingdom, MD  lidocaine (XYLOCAINE) 5 % ointment Apply to feet twice daily as needed for severe pain.  Use gloves. 06/03/17   Patel, Donika K, DO  losartan (COZAAR) 100 MG tablet TAKE 1 TABLET BY MOUTH DAILY 03/18/17   [name], MD  lovastatin (MEVACOR) 20 MG tablet Take 20 mg by mouth daily.    [provider]  metoCLOPramide (REGLAN) 5 MG tablet TAKE 1 TABLET BY MOUTH 4 TIMES DAILY BEFORE MEALS AND AT BEDTIME. 03/18/17   [name], MD  metoprolol succinate (TOPROL-XL) 100 MG 24 hr tablet Take half tab in the morning and whole tab in the pm; Take with or immediately following a meal. 03/22/17   Almyra Deforest, PA  mirtazapine (REMERON SOL-TAB) 15 MG disintegrating tablet TAKE 1 TABLET BY MOUTH AT BEDTIME. 03/18/17   Jegede, Olugbemiga E, MD  montelukast (SINGULAIR) 10 MG tablet Take 10 mg by mouth daily as needed (allergies).     [provider]    nitroGLYCERIN (NITROSTAT) 0.4 MG SL tablet Place 1 tablet (0.4 mg total) under the tongue every 5 (five) minutes as needed. Patient taking differently: Place 0.4 mg under the tongue every 5 (five) minutes as needed for chest pain.  08/06/16   Cheryln Manly, NP  nortriptyline (PAMELOR) 10 MG capsule Take 3 tablet at bedtime. 06/03/17   Patel, Donika K, DO  ondansetron (ZOFRAN) 4 MG tablet Take 1 tablet (4 mg total) by mouth daily as needed for nausea or vomiting. Patient not taking: Reported on 06/10/2017 08/30/16 08/30/17  Geradine Girt, DO  oxyCODONE-acetaminophen (PERCOCET) 5-325 MG tablet Take 1 tablet by mouth every 6 (six) hours as needed for severe pain. Patient not taking: Reported on 06/10/2017 02/01/17   Gabriel Earing, PA-C  pantoprazole (PROTONIX) 40 MG tablet TAKE 1 TABLET BY MOUTH DAILY. 03/18/17   [name], MD  senna-docusate (SENOKOT-S) 8.6-50 MG tablet Take 1 tablet by mouth 2 (two) times daily. Patient taking differently: Take 1 tablet by mouth 2 (two) times daily as needed for mild constipation.  08/30/16   Geradine Girt, DO    Family History Family History  Problem Relation Age of Onset  . Hypertension Father   . Hypertension Sister   . Hypertension Brother   . Breast cancer Cousin        x2    Social History Social History   Tobacco Use  . Smoking status: Former Smoker    Years: 20.00    Types: Cigarettes    Last attempt to quit: 11/2016    Years since quitting: 0.6  . Smokeless tobacco: Never Used  . Tobacco comment: Smokes on rare occasions  Substance Use Topics  . Alcohol use: No    Alcohol/week: 0.0 oz  . Drug use: No     Allergies   Penicillins and Lisinopril   Review of Systems Review of Systems  All other systems reviewed and are negative.    Physical Exam Updated Vital Signs BP (!) 170/91   Pulse (!) 107   Temp 98.1 F (36.7 C) (Oral)   Resp 17   SpO2 100%   Physical Exam  Constitutional: She is oriented to person,  place, and time. She appears well-developed and well-nourished.  HENT:  Head: Normocephalic.  Eyes: Pupils are equal, round, and reactive to  light. EOM are normal.  Neck: Normal range of motion.  Cardiovascular: Regular rhythm, intact distal pulses and normal pulses.  Pulmonary/Chest: Effort normal and breath sounds normal.  Abdominal: Soft. Bowel sounds are normal. She exhibits no distension. There is no tenderness.  Musculoskeletal: Normal range of motion.       Right lower leg: Normal.       Left lower leg: Normal.  Neurological: She is alert and oriented to person, place, and time.  Skin: Skin is warm. Capillary refill takes less than 2 seconds.  Psychiatric: She has a normal mood and affect.  Nursing note and vitals reviewed.    ED Treatments / Results  Labs (all labs ordered are listed, but only abnormal results are displayed) Labs Reviewed  BASIC METABOLIC PANEL - Abnormal; Notable for the following components:      Result Value   CO2 21 (*)    Glucose, Bld 197 (*)    BUN 60 (*)    Creatinine, Ser 4.13 (*)    Calcium 8.8 (*)    GFR calc non Af Amer 12 (*)    GFR calc Af Amer 14 (*)    All other components within normal limits  CBC - Abnormal; Notable for the following components:   MCH 24.6 (*)    MCHC 29.8 (*)    All other components within normal limits  I-STAT TROPONIN, ED - Abnormal; Notable for the following components:   Troponin i, poc 0.70 (*)    All other components within normal limits  I-STAT BETA HCG BLOOD, ED ([facility], [facility], [facility] ONLY)  CBG MONITORING, ED    EKG EKG Interpretation  Date/Time:  Saturday June 18 2017 14:39:36 EDT Ventricular Rate:  112 PR Interval:  162 QRS Duration: 90 QT Interval:  326 QTC Calculation: 444 R Axis:   89 Text Interpretation:  Sinus tachycardia with Premature atrial complexes Possible Left atrial enlargement Borderline ECG st segment elevated v2 and v3-v2 apppears unchanged, ? elevation v3 unable to interpret v1  Confirmed  by Pattricia Boss [phone]) on 06/18/2017 4:16:58 PM   Radiology Dg Chest 2 View  Result Date: 06/18/2017 CLINICAL DATA:  Dyspnea EXAM: CHEST - 2 VIEW COMPARISON:  03/16/2017 chest radiograph. FINDINGS: Slightly low lung volumes. Stable cardiomediastinal silhouette with top-normal heart size. No pneumothorax. Trace bilateral pleural effusions. No overt pulmonary edema. No acute consolidative airspace disease. Cholecystectomy clips are seen in the right upper quadrant of the abdomen. IMPRESSION: Slightly low lung volumes. Top-normal heart size. No overt pulmonary edema. Trace bilateral pleural effusions. Electronically Signed   By: Ilona Sorrel M.D.   On: 06/18/2017 15:16    Procedures Procedures (including critical care time)  Medications Ordered in ED Medications - No data to display   Initial Impression / Assessment and Plan / ED Course  I have reviewed the triage vital signs and the nursing notes.  Pertinent labs & imaging results that were available during my care of the patient were reviewed by me and considered in my medical decision making (see chart for details).    Discussed with Dr. Elson Areas  on call for cardiology, and he feels is likely demand ischemia in setting of renal failure with demand ischemia. Patient started on nitro drip and asa given here. Continues with pain and hypertension. Continuing to titrate nitro and lasix given Discussed with Dr.Opyd on for hospitatlist and will see for admission. esrd being prepared for dialysis with increasing creatinine-may need renal involvement if not responsive to diuresis  CRITICAL  CARE Performed by: Pattricia Boss Total critical care time: 45 minutes Critical care time was exclusive of separately billable procedures and treating other patients. Critical care was necessary to treat or prevent imminent or life-threatening deterioration. Critical care was time spent personally by me on the following activities: development of treatment plan  with patient and/or surrogate as well as nursing, discussions with consultants, evaluation of patient's response to treatment, examination of patient, obtaining history from patient or surrogate, ordering and performing treatments and interventions, ordering and review of laboratory studies, ordering and review of radiographic studies, pulse oximetry and re-evaluation of patient's condition.   Final Clinical Impressions(s) / ED Diagnoses   Final diagnoses:  Chest pain, unspecified type  Elevated troponin  ESRD (end stage renal disease) [facility])    ED Discharge Orders    None       Pattricia Boss, MD 06/18/17 1931

## 2017-06-19 ENCOUNTER — Inpatient Hospital Stay (HOSPITAL_COMMUNITY): Payer: Medicaid Other

## 2017-06-19 DIAGNOSIS — I340 Nonrheumatic mitral (valve) insufficiency: Secondary | ICD-10-CM

## 2017-06-19 LAB — CBG MONITORING, ED: GLUCOSE-CAPILLARY: 206 mg/dL — AB (ref 65–99)

## 2017-06-19 LAB — BASIC METABOLIC PANEL
Anion gap: 12 (ref 5–15)
BUN: 63 mg/dL — AB (ref 6–20)
CHLORIDE: 106 mmol/L (ref 101–111)
CO2: 19 mmol/L — AB (ref 22–32)
Calcium: 8.4 mg/dL — ABNORMAL LOW (ref 8.9–10.3)
Creatinine, Ser: 4.7 mg/dL — ABNORMAL HIGH (ref 0.44–1.00)
GFR calc non Af Amer: 10 mL/min — ABNORMAL LOW (ref >60)
GFR, EST AFRICAN AMERICAN: 12 mL/min — AB (ref >60)
Glucose, Bld: 265 mg/dL — ABNORMAL HIGH (ref 65–99)
POTASSIUM: 4.4 mmol/L (ref 3.5–5.1)
SODIUM: 137 mmol/L (ref 135–145)

## 2017-06-19 LAB — ECHOCARDIOGRAM COMPLETE
HEIGHTINCHES: 65 in
WEIGHTICAEL: 2640 [oz_av]

## 2017-06-19 LAB — GLUCOSE, CAPILLARY
Glucose-Capillary: 237 mg/dL — ABNORMAL HIGH (ref 65–99)
Glucose-Capillary: 370 mg/dL — ABNORMAL HIGH (ref 65–99)

## 2017-06-19 LAB — TROPONIN I
TROPONIN I: 2.2 ng/mL — AB (ref <0.03)
TROPONIN I: 2.34 ng/mL — AB (ref <0.03)

## 2017-06-19 LAB — HEPARIN LEVEL (UNFRACTIONATED): Heparin Unfractionated: 1.63 IU/mL — ABNORMAL HIGH (ref 0.30–0.70)

## 2017-06-19 MED ORDER — INSULIN GLARGINE 100 UNIT/ML ~~LOC~~ SOLN
10.0000 [IU] | Freq: Every day | SUBCUTANEOUS | Status: DC
  Administered 2017-06-19 – 2017-06-22 (×4): 10 [IU] via SUBCUTANEOUS
  Filled 2017-06-19 (×5): qty 0.1

## 2017-06-19 MED ORDER — INSULIN GLARGINE 100 UNIT/ML ~~LOC~~ SOLN
30.0000 [IU] | Freq: Every day | SUBCUTANEOUS | Status: DC
  Filled 2017-06-19: qty 0.3

## 2017-06-19 MED ORDER — FUROSEMIDE 10 MG/ML IJ SOLN
80.0000 mg | Freq: Two times a day (BID) | INTRAMUSCULAR | Status: DC
  Administered 2017-06-19 – 2017-06-20 (×4): 80 mg via INTRAVENOUS
  Filled 2017-06-19 (×4): qty 8

## 2017-06-19 NOTE — ED Notes (Addendum)
In to see pt. Pt sleeping at this time. After waking pt, pt states that her pain is still at 7-8. No s/s of distress. Attempted to start a second line. Pt jerked and crying "take it out,take it out". Pt refuses another IV at this time. Will continue to monitor.

## 2017-06-19 NOTE — ED Notes (Signed)
In to see pt. Pt sleeping. When ask how her pain is, pt states "still a 7-8".

## 2017-06-19 NOTE — ED Notes (Signed)
HH/CARBMOD ORDERED

## 2017-06-19 NOTE — ED Notes (Signed)
Admitting md paged to update on status of pt. After waking pt, pt states her pain is a 5 at this time. Denies nausea or sob. VSS. Will continue to monitor.

## 2017-06-19 NOTE — ED Notes (Signed)
Pt sleeping. When asked about her pain level, pt states "its still the same, a 7-8".

## 2017-06-19 NOTE — Progress Notes (Signed)
PROGRESS NOTE    Susan Horton  UDJ:[id number] DOB: 27-Jul-1970 DOA: 06/18/2017 PCP: Charlott Rakes, MD    Brief Narrative:  47 year old female who presented with dyspnea, orthopnea and chest discomfort.  She does have the significant past medical history for coronary artery disease status post angioplasty, chronic kidney disease stage IV, chronic diastolic heart failure, asthma, depression, hypertension and type 2 diabetes mellitus. She reported worsening dyspnea over the last several days prior to hospitalization, more severe over the last 24 hours, associated with chest pressure and orthopnea.  Initial physical examination blood pressure 116/91, heart rate 106, respiratory 25, oxygen saturation 99%.  Moist mucous membranes, lungs with decreased breath sounds bilaterally, bilateral rales, heart S1-S2 present and rhythmic, positive JVD, abdomen soft nontender, nondistended, lower extremities with no edema, left upper extremity AV fistula with positive bruit and thrill.  Sodium 137, potassium 4.6, chloride 109, bicarb 21, glucose 197, BUN 60, creatinine 4.13, white count 9.5, hemoglobin 12.2, hematocrit 40.9, platelets 356, troponin II 0.46.  Chest x-ray hypoinflated, increased interstitial markings bilaterally with fluid in the right fissure, small bibasilar pleural effusions.  EKG sinus rhythm 112 bpm, normal axis, normal intervals, positive LVH per EKG criteria.   Patient was admitted to the hospital with working diagnosis of acute on chronic decompensation of diastolic heart failure, complicated by acute cardiogenic pulmonary edema.    Assessment & Plan:   Principal Problem:   Acute on chronic diastolic CHF (congestive heart failure) (HCC) Active Problems:   Uncontrolled type 2 diabetes mellitus with peripheral neuropathy (HCC)   Gastroparesis   Essential hypertension, benign   Coronary artery disease involving native coronary artery of native heart with angina pectoris (HCC)   CKD (chronic  kidney disease) stage 4, GFR 15-29 ml/min (HCC)   Chest pain   Adjustment disorder with depressed mood   Moderate persistent asthma   Elevated troponin   Hypertensive urgency   1.  Acute on chronic diastolic heart failure decompensation. Patient chest pain free and improved blood pressure, will hold on nitro drip and will continue diuresis with furosemide IV 80 mg q12 h. Will target negative fluid balance. Elevated troponin with non ischemic pattern, ekg with no signs of ischemia, will hold on heparin drip for now. Transfer patient to telemetry.   2.  Acute cardiogenic pulmonary edema. Will continue aggressive diuresis with furosemide, oxymetry monitoring and supplemental 02 per [place].   3.  Ischemic heart disease. Will continue antiplatelet therapy with clopidogrel and aspirin.   4.  Chronic kidney disease stage IV. Close follow up on renal function per serum cr and urine output, patient with advanced renal disease, has av fistula on the left upper extremity. May need renal replacement therapy on this admission, of worsening renal function will consult renal. Continue calcitriol and vitamin D. Discontinue pantoprazole.   5.  Type 2 diabetes mellitus. Continue glucose cover and monitoring, patient tolerating po well, continue insulin sliding scale. Capillary glucose 233, 232, 174, 249, 206, will decrease dose of basal insulin to prevent hypoglycemia.   6.  Hypertension. Improved blood pressure control, hold on nitroglycerin drip and continue oral agents with amlodipine and metoprolol, as needed hydralazine.   7. Depression. Will continue nortriptyline, mirtazapine and duloxetin.   DVT prophylaxis: heparin   Code Status:  full Family Communication: no family at the bedside  Disposition Plan: telemetry   Consultants:     Procedures:     Antimicrobials:       Subjective: Patient is feeling better, chest pain  has improved, dyspnea less intense but still not back to baseline. She  has been told to be high risk for worsening renal function.   Objective: Vitals:   06/19/17 0830 06/19/17 0845 06/19/17 0900 06/19/17 0915  BP: 135/75 138/80 (!) 141/82 138/78  Pulse: 88 89 88 87  Resp: (!) 9 17 17  (!) 27  Temp:      TempSrc:      SpO2: 99% 96% 98% 96%  Weight:      Height:        Intake/Output Summary (Last 24 hours) at 06/19/2017 0944 Last data filed at 06/18/2017 2105 Gross per 24 hour  Intake 10.4 ml  Output 500 ml  Net -489.6 ml   Filed Weights   06/18/17 2137  Weight: 74.8 kg (165 lb)    Examination:   General: deconditioned, not in pain Neurology: Awake and alert, non focal  E ENT: mild pallor, no icterus, oral mucosa moist Cardiovascular: No JVD. S1-S2 present, rhythmic, no gallops, rubs, or murmurs. No lower extremity edema. Pulmonary: decreased breath sounds bilaterally, adequate air movement, no wheezing, rhonchi or rales. Gastrointestinal. Abdomen with no organomegaly, non tender, no rebound or guarding Skin. No rashes Musculoskeletal: no joint deformities     Data Reviewed: I have personally reviewed following labs and imaging studies  CBC: Recent Labs  Lab 06/17/17 1311 06/18/17 1443  WBC  --  9.5  HGB 11.8* 12.2  HCT  --  40.9  MCV  --  82.6  PLT  --  347   Basic Metabolic Panel: Recent Labs  Lab 06/18/17 1443 06/19/17 0214  NA 137 137  K 4.6 4.4  CL 109 106  CO2 21* 19*  GLUCOSE 197* 265*  BUN 60* 63*  CREATININE 4.13* 4.70*  CALCIUM 8.8* 8.4*   GFR: Estimated Creatinine Clearance: 15.1 mL/min (A) (by C-G formula based on SCr of 4.7 mg/dL (H)). Liver Function Tests: No results for input(s): AST, ALT, ALKPHOS, BILITOT, PROT, ALBUMIN in the last 168 hours. No results for input(s): LIPASE, AMYLASE in the last 168 hours. No results for input(s): AMMONIA in the last 168 hours. Coagulation Profile: No results for input(s): INR, PROTIME in the last 168 hours. Cardiac Enzymes: Recent Labs  Lab 06/18/17 2244  06/19/17 0214  TROPONINI 2.46* 2.20*   BNP (last 3 results) No results for input(s): PROBNP in the last 8760 hours. HbA1C: No results for input(s): HGBA1C in the last 72 hours. CBG: Recent Labs  Lab 06/18/17 1732 06/18/17 2243 06/19/17 0815  GLUCAP 174* 249* 206*   Lipid Profile: No results for input(s): CHOL, HDL, LDLCALC, TRIG, CHOLHDL, LDLDIRECT in the last 72 hours. Thyroid Function Tests: No results for input(s): TSH, T4TOTAL, FREET4, T3FREE, THYROIDAB in the last 72 hours. Anemia Panel: No results for input(s): VITAMINB12, FOLATE, FERRITIN, TIBC, IRON, RETICCTPCT in the last 72 hours.    Radiology Studies: I have reviewed all of the imaging during this hospital visit personally     Scheduled Meds: . amLODipine  10 mg Oral Daily  . aspirin  81 mg Oral Daily  . calcitRIOL  0.25 mcg Oral Daily  . cholecalciferol  400 Units Oral Daily  . clopidogrel  75 mg Oral Q breakfast  . DULoxetine  60 mg Oral Daily  . gabapentin  300 mg Oral BID  . insulin aspart  0-5 Units Subcutaneous QHS  . insulin aspart  0-9 Units Subcutaneous TID WC  . insulin glargine  30 Units Subcutaneous BID  . metoCLOPramide  5 mg Oral TID AC & HS  . metoprolol succinate  50 mg Oral BID  . mirtazapine  15 mg Oral QHS  . mometasone-formoterol  2 puff Inhalation BID  . nortriptyline  30 mg Oral QHS  . pantoprazole  40 mg Oral Daily  . pravastatin  20 mg Oral q1800  . sodium chloride flush  3 mL Intravenous Q12H   Continuous Infusions: . sodium chloride    . furosemide    . heparin 900 Units/hr (06/19/17 0030)  . nitroGLYCERIN 60 mcg/min (06/19/17 0435)     LOS: 1 day        Chazlyn Cude Gerome Apley, MD [facility] Pager [phone]

## 2017-06-19 NOTE — ED Notes (Signed)
Pt ambulated to the bathroom with no difficulty

## 2017-06-19 NOTE — ED Notes (Signed)
Patient refused lunch tray. Nurse spoke with patient stated wants to order specific food item. Nurse order food items for patient.

## 2017-06-19 NOTE — ED Notes (Signed)
MD paged about editing order for IV lasix infusion, pt refuses a second IV to be placed. Pt already has infusions X 2 going to present IV.

## 2017-06-19 NOTE — ED Notes (Signed)
Breakfast tray ordered 

## 2017-06-19 NOTE — ED Notes (Signed)
Pt still states her pain is a 7-8. Pt is at her systolic bp goal of 573-225. Will continue to monitor.

## 2017-06-19 NOTE — ED Notes (Signed)
Spoke with Dr. Myna Hidalgo about pt status. MD aware of pt pain level and nitro drip rate. States pt has chronic pain, and probably stays around that number. No new orders. Will continue to monitor pt. VSS. No distress noted. Resp. Even and non-labored.

## 2017-06-19 NOTE — ED Notes (Signed)
Pt dissatisfied with lunch tray. Pt refused it. Pt stated that someone should "let her choose". Pt informed that the ED staff has no control over what diet order is placed and what Is cooked. Informed Greg - RN.

## 2017-06-19 NOTE — ED Notes (Addendum)
Pt refusing lab draw. Ordered IV team consult. RN with IV team states that they are unable to start second line on this pt due to "Not needed currently" even though pt has heparin and nitro going in one line and has fistula to left arm and unable to stick that arm. Lab drawn from IV site and wasted 10 mls before drawing.  Pt still states that her pain is a 7 to 8.

## 2017-06-19 NOTE — Progress Notes (Signed)
*  PRELIMINARY RESULTS* Echocardiogram 2D Echocardiogram has been performed.  Susan Horton 06/19/2017, 4:28 PM

## 2017-06-20 ENCOUNTER — Ambulatory Visit (HOSPITAL_COMMUNITY): Payer: Medicaid Other

## 2017-06-20 LAB — BASIC METABOLIC PANEL
Anion gap: 11 (ref 5–15)
BUN: 73 mg/dL — ABNORMAL HIGH (ref 6–20)
CHLORIDE: 107 mmol/L (ref 101–111)
CO2: 15 mmol/L — AB (ref 22–32)
Calcium: 8.1 mg/dL — ABNORMAL LOW (ref 8.9–10.3)
Creatinine, Ser: 5.33 mg/dL — ABNORMAL HIGH (ref 0.44–1.00)
GFR calc non Af Amer: 9 mL/min — ABNORMAL LOW (ref >60)
GFR, EST AFRICAN AMERICAN: 10 mL/min — AB (ref >60)
GLUCOSE: 271 mg/dL — AB (ref 65–99)
Potassium: 5.3 mmol/L — ABNORMAL HIGH (ref 3.5–5.1)
Sodium: 133 mmol/L — ABNORMAL LOW (ref 135–145)

## 2017-06-20 LAB — GLUCOSE, CAPILLARY
GLUCOSE-CAPILLARY: 167 mg/dL — AB (ref 65–99)
Glucose-Capillary: 255 mg/dL — ABNORMAL HIGH (ref 65–99)
Glucose-Capillary: 215 mg/dL — ABNORMAL HIGH (ref 65–99)
Glucose-Capillary: 203 mg/dL — ABNORMAL HIGH (ref 65–99)

## 2017-06-20 MED ORDER — FUROSEMIDE 10 MG/ML IJ SOLN
160.0000 mg | Freq: Three times a day (TID) | INTRAVENOUS | Status: DC
  Administered 2017-06-20 – 2017-06-23 (×7): 160 mg via INTRAVENOUS
  Filled 2017-06-20 (×2): qty 16
  Filled 2017-06-20: qty 4
  Filled 2017-06-20 (×5): qty 16
  Filled 2017-06-20: qty 2
  Filled 2017-06-20: qty 16

## 2017-06-20 NOTE — Consult Note (Signed)
Susan Horton is a 47 y.o. female with CKD 5 followed by Dr Jimmy Footman, s/p LUE AVF Jan 2019 by Dr [name] admitted with increasing SOB and volume overload especially worse over the last couple of weeks PTA.  Despite escalating OP doses of furosemide pts symptoms had not abated and she presented to the [facility] ED on 6/8 with BUN 60 and creat 4.'13mg'$ /dl. In 03/2017 creat was 3.'7mg'$ /dl.  With iv furosemide there has been no significant diuresis and serum creatinine has risen to 5.'33mg'$ /dl and BUN 73.  She describes weight loss of several months with the background history of gastroparesis, a metallic taste in mouth and generalized malaise.  She has gastroparesis so episodic nausea and vomiting and abd pain are not unusual. BP has been as low as 104/56 today.  She was on ARB PTA.  Past Medical History:  Diagnosis Date  . Anemia   . Asthma   . CAD (coronary artery disease)    DES to mid LAD July 2018, residual moderate RCA disease  . Cataract   . CKD (chronic kidney disease) stage 4, GFR 15-29 ml/min (HCC)   . DVT (deep venous thrombosis) ([place])    1996 during pregnancy, 2015 left leg  . Essential hypertension 12/11/2015  . Gastroparesis   . GERD (gastroesophageal reflux disease)   . Hidradenitis   . Migraine   . Neuropathy   . Type 2 diabetes mellitus ([place])   . Type II diabetes mellitus ([place])    Past Surgical History:  Procedure Laterality Date  . ABDOMINOPLASTY    . APPENDECTOMY  1995  . AV FISTULA PLACEMENT Left 02/01/2017   Procedure: ARTERIOVENOUS BRACHIOCEPHALIC (AV) FISTULA CREATION;  Surgeon: [name], MD;  Location: [facility];  Service: Vascular;  Laterality: Left;  . CHOLECYSTECTOMY  1993  . CORONARY ANGIOPLASTY WITH STENT PLACEMENT    . CORONARY STENT INTERVENTION N/A 08/05/2016   Procedure: Coronary Stent Intervention;  Surgeon: [name], MD;  Location: [facility];  Service: Cardiovascular;  Laterality: N/A;  . EXPLORATORY LAPAROTOMY  08/14/2005   lysis of  adhesions, drainage of tubo-ovarian abscess  . HYDRADENITIS EXCISION  02/08/2011   Procedure: EXCISION HYDRADENITIS GROIN;  Surgeon: [name], MD;  Location: [facility];  Service: General;  Laterality: N/A;  Excisioin of Hidradenitis Left groin  . INGUINAL HIDRADENITIS EXCISION  07/06/2010   bilateral  . LEFT HEART CATH AND CORONARY ANGIOGRAPHY N/A 08/05/2016   Procedure: Left Heart Cath and Coronary Angiography;  Surgeon: [name], MD;  Location: [facility];  Service: Cardiovascular;  Laterality: N/A;  . REDUCTION MAMMAPLASTY  2002  . TONSILLECTOMY    . TUBAL LIGATION  1996  . VAGINAL HYSTERECTOMY  08/04/2005   and cysto   Social History:  reports that she quit smoking about 7 months ago. Her smoking use included cigarettes. She quit after 20.00 years of use. She has never used smokeless tobacco. She reports that she does not drink alcohol or use drugs. Allergies:  Allergies  Allergen Reactions  . Penicillins Anaphylaxis, Itching, Swelling, Rash and Other (See Comments)    Swelling of throat & whole mouth  Has patient had a PCN reaction causing immediate rash, facial/tongue/throat swelling, SOB or lightheadedness with hypotension: Yes Has patient had a PCN reaction causing severe rash involving mucus membranes or skin necrosis: No Has patient had a PCN reaction that required hospitalization: No Has patient had a PCN reaction occurring within the last 10 years: No  If all of the above answers are "NO", then may proceed with Cephalosporin use.  [name] Lisinopril Cough   Family History  Problem Relation Age of Onset  . Hypertension Father   . Hypertension Sister   . Hypertension Brother   . Breast cancer Cousin        x2    Medications:  Prior to Admission:  Medications Prior to Admission  Medication Sig Dispense Refill Last Dose  . albuterol (PROVENTIL HFA;VENTOLIN HFA) 108 (90 BASE) MCG/ACT inhaler Inhale 1-2 puffs into the lungs every 6 (six)  hours as needed for wheezing or shortness of breath.    Past Week at Unknown time  . amLODipine (NORVASC) 10 MG tablet TAKE 1 TABLET BY MOUTH DAILY 30 tablet 0 06/18/2017 at am  . aspirin 81 MG chewable tablet Chew 1 tablet (81 mg total) by mouth daily. 30 tablet 0 06/18/2017 at 0900  . Calcifediol ER (RAYALDEE) 30 MCG CPCR Take 30 mcg by mouth daily.    06/18/2017 at am  . calcitRIOL (ROCALTROL) 0.25 MCG capsule Take 0.25 mcg by mouth daily.  6 06/18/2017 at Unknown time  . Cholecalciferol (VITAMIN D-3 PO) Take 1 capsule by mouth daily.   06/18/2017 at Unknown time  . clopidogrel (PLAVIX) 75 MG tablet Take 1 tablet (75 mg total) by mouth daily with breakfast. 90 tablet 2 06/18/2017 at 0900  . cyclobenzaprine (FLEXERIL) 5 MG tablet Take 1 tablet (5 mg total) by mouth at bedtime as needed for muscle spasms. 30 tablet 3 Unk at [employer]  . DULoxetine (CYMBALTA) 60 MG capsule Take 1 capsule (60 mg total) by mouth daily. 30 capsule 3 06/17/2017 at Unknown time  . Ferrous Sulfate (IRON) 325 (65 Fe) MG TABS Take 325 mg by mouth daily. 30 each 3 06/18/2017 at Unknown time  . Fluticasone-Salmeterol (ADVAIR) 250-50 MCG/DOSE AEPB Inhale 1 puff into the lungs 2 (two) times daily. 60 each 3 06/18/2017 at am  . furosemide (LASIX) 80 MG tablet Take 160 mg by mouth 2 (two) times daily.  6 06/18/2017 at am  . gabapentin (NEURONTIN) 300 MG capsule TAKE 2 CAPSULES BY MOUTH 3 TIMES DAILY. (Patient taking differently: Take 600 mg by mouth two times a day) 180 capsule 0 06/17/2017 at Unknown time  . Insulin Glargine (TOUJEO SOLOSTAR) 300 UNIT/ML SOPN Inject 60 Units into the skin 2 (two) times daily. 12 pen 5 06/18/2017 at am  . insulin regular (HUMULIN R) 100 units/mL injection Inject 25-35 into the skin 3 (three) times daily before meals. Sliding scale (Patient taking differently: Inject 25-35 Units into the skin See admin instructions. Inject 25-35 units into the skin 3 (three) times daily before meals, per sliding scale) 30 mL 5 06/18/2017 at Unknown  time  . lidocaine (XYLOCAINE) 5 % ointment Apply to feet twice daily as needed for severe pain.  Use gloves. 35.44 g 3 06/17/2017 at pm  . losartan (COZAAR) 100 MG tablet TAKE 1 TABLET BY MOUTH DAILY 30 tablet 0 06/18/2017 at am  . lovastatin (MEVACOR) 20 MG tablet Take 20 mg by mouth at bedtime.    06/17/2017 at pm  . metoCLOPramide (REGLAN) 5 MG tablet TAKE 1 TABLET BY MOUTH 4 TIMES DAILY BEFORE MEALS AND AT BEDTIME. 90 tablet 0 06/18/2017 at Unknown time  . metoprolol succinate (TOPROL-XL) 50 MG 24 hr tablet Take 50 mg by mouth 2 (two) times daily.  6 06/18/2017 at 1200  . mirtazapine (REMERON SOL-TAB) 15 MG disintegrating tablet TAKE 1 TABLET BY MOUTH  AT BEDTIME. 30 tablet 0 06/17/2017 at pm  . montelukast (SINGULAIR) 10 MG tablet Take 10 mg by mouth daily as needed (allergies).    Unk at [employer]  . nitroGLYCERIN (NITROSTAT) 0.4 MG SL tablet Place 1 tablet (0.4 mg total) under the tongue every 5 (five) minutes as needed. (Patient taking differently: Place 0.4 mg under the tongue every 5 (five) minutes as needed for chest pain. ) 25 tablet 3 2018 at 2018  . nortriptyline (PAMELOR) 10 MG capsule Take 3 tablet at bedtime. (Patient taking differently: Take 30 mg by mouth at bedtime. ) 120 capsule 3 06/17/2017 at pm  . ondansetron (ZOFRAN) 4 MG tablet Take 1 tablet (4 mg total) by mouth daily as needed for nausea or vomiting. 30 tablet 0 Unk at [facility]  . pantoprazole (PROTONIX) 40 MG tablet TAKE 1 TABLET BY MOUTH DAILY. 30 tablet 2 06/18/2017 at am  . senna-docusate (SENOKOT-S) 8.6-50 MG tablet Take 1 tablet by mouth 2 (two) times daily. (Patient taking differently: Take 1 tablet by mouth 2 (two) times daily as needed for mild constipation. )   Unk at [facility]  . trimethoprim-polymyxin b (POLYTRIM) ophthalmic solution INSTILL ONE DROP INTO BOTH EYES 4 TIMES A DAY FOR 2 DAYS AFTER EACH MONTHLY EYE INJECTION  12 Past Week at Unknown time   Scheduled: . amLODipine  10 mg Oral Daily  . aspirin  81 mg Oral Daily  . calcitRIOL  0.25  mcg Oral Daily  . cholecalciferol  400 Units Oral Daily  . clopidogrel  75 mg Oral Q breakfast  . DULoxetine  60 mg Oral Daily  . furosemide  80 mg Intravenous BID  . gabapentin  300 mg Oral BID  . insulin aspart  0-9 Units Subcutaneous TID WC  . insulin glargine  10 Units Subcutaneous QHS  . metoCLOPramide  5 mg Oral TID AC & HS  . metoprolol succinate  50 mg Oral BID  . mirtazapine  15 mg Oral QHS  . mometasone-formoterol  2 puff Inhalation BID  . nortriptyline  30 mg Oral QHS  . pravastatin  20 mg Oral q1800  . sodium chloride flush  3 mL Intravenous Q12H    ROS: as per HPI Blood pressure 132/81, pulse 86, temperature 98.6 F (37 C), temperature source Oral, resp. rate 18, height '5\' 5"'$  (1.651 m), weight 74.7 kg (164 lb 9.6 oz), SpO2 98 %.  General appearance: alert and cooperative Head: Normocephalic, without obvious abnormality, atraumatic Eyes: negative Resp: clear to auscultation bilaterally Chest wall: no tenderness Cardio: regular rate and rhythm, S1, S2 normal, no murmur, click, rub or gallop GI: mild subcostal tenderness bilat Extremities: extremities normal, atraumatic, no cyanosis or edema and LUE BC AVF looks imature Skin: Skin color, texture, turgor normal. No rashes or lesions Neurologic: Grossly normal looks immature Results for orders placed or performed during the hospital encounter of 06/18/17 (from the past 48 hour(s))  CBG monitoring, ED     Status: Abnormal   Collection Time: 06/18/17 10:43 PM  Result Value Ref Range   Glucose-Capillary 249 (H) 65 - 99 mg/dL   Comment 1 Notify RN    Comment 2 Document in Chart   Troponin I (q 6hr x 3)     Status: Abnormal   Collection Time: 06/18/17 10:44 PM  Result Value Ref Range   Troponin I 2.46 (HH) <0.03 ng/mL    Comment: CRITICAL RESULT CALLED TO, READ BACK BY AND VERIFIED WITH: WALLACE S,RN 06/18/17 2332 WAYK Performed at  [facility], [place] [address] [place], [place] [address]   Basic metabolic panel      Status: Abnormal   Collection Time: 06/19/17  2:14 AM  Result Value Ref Range   Sodium 137 135 - 145 mmol/L   Potassium 4.4 3.5 - 5.1 mmol/L   Chloride 106 101 - 111 mmol/L   CO2 19 (L) 22 - 32 mmol/L   Glucose, Bld 265 (H) 65 - 99 mg/dL   BUN 63 (H) 6 - 20 mg/dL   Creatinine, Ser 4.70 (H) 0.44 - 1.00 mg/dL   Calcium 8.4 (L) 8.9 - 10.3 mg/dL   GFR calc non Af Amer 10 (L) >60 mL/min   GFR calc Af Amer 12 (L) >60 mL/min    Comment: (NOTE) The eGFR has been calculated using the CKD EPI equation. This calculation has not been validated in all clinical situations. eGFR's persistently <60 mL/min signify possible Chronic Kidney Disease.    Anion gap 12 5 - 15    Comment: Performed at [facility] [address], [place], [place] [address]  Troponin I (q 6hr x 3)     Status: Abnormal   Collection Time: 06/19/17  2:14 AM  Result Value Ref Range   Troponin I 2.20 (HH) <0.03 ng/mL    Comment: CRITICAL VALUE NOTED.  VALUE IS CONSISTENT WITH PREVIOUSLY REPORTED AND CALLED VALUE. Performed at [facility], [place] [address], [place], [place] [address]   CBG monitoring, ED     Status: Abnormal   Collection Time: 06/19/17  8:15 AM  Result Value Ref Range   Glucose-Capillary 206 (H) 65 - 99 mg/dL  Troponin I (q 6hr x 3)     Status: Abnormal   Collection Time: 06/19/17  8:33 AM  Result Value Ref Range   Troponin I 2.34 (HH) <0.03 ng/mL    Comment: CRITICAL VALUE NOTED.  VALUE IS CONSISTENT WITH PREVIOUSLY REPORTED AND CALLED VALUE. Performed at [facility], [place] [address] [place], [place] [address]   Heparin level (unfractionated)     Status: Abnormal   Collection Time: 06/19/17  8:33 AM  Result Value Ref Range   Heparin Unfractionated 1.63 (H) 0.30 - 0.70 IU/mL    Comment: RESULTS CONFIRMED BY MANUAL DILUTION Performed at [facility], [place] [address] [place], [place] [address]   Glucose, capillary     Status: Abnormal   Collection Time: 06/19/17  5:19 PM   Result Value Ref Range   Glucose-Capillary 237 (H) 65 - 99 mg/dL  Glucose, capillary     Status: Abnormal   Collection Time: 06/19/17 10:02 PM  Result Value Ref Range   Glucose-Capillary 370 (H) 65 - 99 mg/dL  Glucose, capillary     Status: Abnormal   Collection Time: 06/20/17  7:32 AM  Result Value Ref Range   Glucose-Capillary 255 (H) 65 - 99 mg/dL  Glucose, capillary     Status: Abnormal   Collection Time: 06/20/17 11:19 AM  Result Value Ref Range   Glucose-Capillary 167 (H) 65 - 99 mg/dL   Comment 1 Notify RN    Comment 2 Document in Chart   Basic metabolic panel     Status: Abnormal   Collection Time: 06/20/17  2:36 PM  Result Value Ref Range   Sodium 133 (L) 135 - 145 mmol/L   Potassium 5.3 (H) 3.5 - 5.1 mmol/L   Chloride 107 101 - 111 mmol/L   CO2 15 (L) 22 -  32 mmol/L   Glucose, Bld 271 (H) 65 - 99 mg/dL   BUN 73 (H) 6 - 20 mg/dL   Creatinine, Ser 5.33 (H) 0.44 - 1.00 mg/dL   Calcium 8.1 (L) 8.9 - 10.3 mg/dL   GFR calc non Af Amer 9 (L) >60 mL/min   GFR calc Af Amer 10 (L) >60 mL/min    Comment: (NOTE) The eGFR has been calculated using the CKD EPI equation. This calculation has not been validated in all clinical situations. eGFR's persistently <60 mL/min signify possible Chronic Kidney Disease.    Anion gap 11 5 - 15    Comment: Performed at [facility] [address], [place], [place] [address]  Glucose, capillary     Status: Abnormal   Collection Time: 06/20/17  4:29 PM  Result Value Ref Range   Glucose-Capillary 215 (H) 65 - 99 mg/dL   Comment 1 Notify RN    Comment 2 Document in Chart   Glucose, capillary     Status: Abnormal   Collection Time: 06/20/17  9:02 PM  Result Value Ref Range   Glucose-Capillary 203 (H) 65 - 99 mg/dL   No results found.  Assessment:  1 CKD 5 with acute exacerbation prob hemodynamically mediated 2 Uremic symptoms 3 Volume overload 4 s/p LUE AVF---looks not currently usable.  Need [facility]VS to eval. 5 Diabetic  gastroparesis hx  Plan: 1 I told her options include-diurese and discharge dry on PO diuretics, or ask [facility] to evaluate AVF and place [facility] if deemed not usable(which is my thought) and begin dialysis.  Keep NPO after MN. 2 Hold amlodipine to allow BP to rise 3 Increase furosemide dosage  Estanislado Emms, MD 06/20/2017, 9:38 PM

## 2017-06-20 NOTE — Progress Notes (Signed)
Pt refused am labs- "she does not want to be stuck" RN and charge RN educated- patient still refused.  Will leave sticky note for MD

## 2017-06-20 NOTE — Progress Notes (Addendum)
Inpatient Diabetes Program Recommendations  AACE/ADA: New Consensus Statement on Inpatient Glycemic Control (2015)  Target Ranges:  Prepandial:   less than 140 mg/dL      Peak postprandial:   less than 180 mg/dL (1-2 hours)      Critically ill patients:  140 - 180 mg/dL   Lab Results  Component Value Date   GLUCAP 167 (H) 06/20/2017   HGBA1C 8.5 05/23/2017    Review of Glycemic Control Results for Susan Horton, Susan Horton (MRN [id number]) as of 06/20/2017 12:00  Ref. Range 06/19/2017 22:02 06/20/2017 07:32 06/20/2017 11:19  Glucose-Capillary Latest Ref Range: 65 - 99 mg/dL 370 (H) 255 (H) 167 (H)   Diabetes history: Type 2 DM Outpatient Diabetes medications: Toujeo 60 units BID, Regular 25-35 units TID,  Current orders for Inpatient glycemic control: Novolog 0-9 units TID, Lantus 10 units QHS  Inpatient Diabetes Program Recommendations:    Noted patient's BS on 6/9 was 370 mg/dL, patient missed lunch time correction dose.  Recommending increasing Lantus to 16 units QHS (patient on 60 units of Toujeo BID and thus may need further adjustments).  Also, consider adding Novolog 0-5 units QHS.  If post prandials continue to remain >180 mg/dL, additionally recommend adding Novolog 3 units TID (assuming patient consumes >50% of meal).  Thanks, Bronson Curb, MSN, RNC-OB Diabetes Coordinator [phone] (8a-5p)  Addendum @ 1330: Spoke with patient regarding outpatient management. Verified home medications. Patient reports checking BS atleast 2-3 times per day. Is a patient of Dr Arman Filter and has managed to lower her A1C from 16% to now 8.5%.   Reviewed patient's current A1c of 8.5%. Explained what a A1c is and what it measures. Also reviewed goal A1c with patient, importance of good glucose control @ home, and blood sugar goals. Education provided on [employer] as a CGM. This may provide additional information to continue her improvement. Explained benefits especially if she is a transplant  recipient. Patient has the needed supplies and has no further questions regarding DM.   Thanks, Bronson Curb, MSN, RNC-OB Diabetes Coordinator [phone] (8a-5p)

## 2017-06-20 NOTE — Plan of Care (Signed)
°  Problem: Coping: °Goal: Level of anxiety will decrease °Outcome: Progressing °  °

## 2017-06-20 NOTE — Progress Notes (Signed)
PROGRESS NOTE    Susan Horton  JIR:[id number] DOB: 03/17/1970 DOA: 06/18/2017 PCP: Charlott Rakes, MD    Brief Narrative:  47 year old female who presented with dyspnea, orthopnea and chest discomfort.  She does have the significant past medical history for coronary artery disease status post angioplasty, chronic kidney disease stage IV, chronic diastolic heart failure, asthma, depression, hypertension and type 2 diabetes mellitus. She reported worsening dyspnea over the last several days prior to hospitalization, more severe over the last 24 hours, associated with chest pressure and orthopnea.  Initial physical examination blood pressure 116/91, heart rate 106, respiratory 25, oxygen saturation 99%.  Moist mucous membranes, lungs with decreased breath sounds bilaterally, bilateral rales, heart S1-S2 present and rhythmic, positive JVD, abdomen soft nontender, nondistended, lower extremities with no edema, left upper extremity AV fistula with positive bruit and thrill.  Sodium 137, potassium 4.6, chloride 109, bicarb 21, glucose 197, BUN 60, creatinine 4.13, white count 9.5, hemoglobin 12.2, hematocrit 40.9, platelets 356, troponin II 0.46.  Chest x-ray hypoinflated, increased interstitial markings bilaterally with fluid in the right fissure, small bibasilar pleural effusions.  EKG sinus rhythm 112 bpm, normal axis, normal intervals, positive LVH per EKG criteria.   Patient was admitted to the hospital with working diagnosis of acute on chronic decompensation of diastolic heart failure, complicated by acute cardiogenic pulmonary edema.    Assessment & Plan:   Principal Problem:   Acute on chronic diastolic CHF (congestive heart failure) (HCC) Active Problems:   Uncontrolled type 2 diabetes mellitus with peripheral neuropathy (HCC)   Gastroparesis   Essential hypertension, benign   Coronary artery disease involving native coronary artery of native heart with angina pectoris (HCC)   CKD (chronic  kidney disease) stage 4, GFR 15-29 ml/min (HCC)   Chest pain   Adjustment disorder with depressed mood   Moderate persistent asthma   Elevated troponin   Hypertensive urgency    1.  Acute on chronic diastolic heart failure decompensation. Positive improvement in symptoms, but not recorded urine output, will continue furosemide 80 mg IV q12 to target negative fluid balance. Renal function with worsening cr. Continue blood pressure control with amlodipine and metoprolol. Troponin flat at 2,4-2,3.   2.  Acute cardiogenic pulmonary edema. Continue oxymetry monitoring and supplemental 02 per [place], will continue aggressive diuresis, may need renal replacement therapy on this amdission.   3.  Ischemic heart disease. Improved chest pressure, troponin are flat at 2,4 to 2,34, non ischemic patter, echocardiography with normal LV systolic function with no wall motion abnormalities. Continue asa and clopidogrel. Statin therapy with pravastatin.   4.  Chronic kidney disease stage IV. Patient is in the renal transplant clinic at [facility], follows with Dr Deterding. Her left upper extremity fistula placed in 01/19, by now should be mature. I call Dr. Joelyn Oms from nephrology, patient likely will need renal replacement therapy on this admission. Continue calcitriol.   5.  Type 2 diabetes mellitus. Glucose cover and monitoring with insulin sliding scale. Capillary glucose uncontrolled at 237, 370, 255, 167, 215, continue to calculate insulin requirements, continue long acting insulin with 10 units of glargine.    6.  Hypertension. Continue blood pressure control with amlodipine and metoprolol, systolic blood pressure 751 to 109 mmHg.   7. Depression. On nortriptyline, mirtazapine and duloxetin. No confusion or agitation.   DVT prophylaxis: heparin   Code Status:  full Family Communication: I spoke with patient's family at the bedside and all questions were addressed.   Disposition  Plan:  telemetry   Consultants:     Procedures:     Antimicrobials:     Subjective: Patient with improved dyspnea but not back to baseline, chest pressure improved, no nausea or vomiting.   Objective: Vitals:   06/19/17 2206 06/19/17 2318 06/20/17 0510 06/20/17 1411  BP: 131/71 134/73 109/63 (!) 104/56  Pulse: 94 94 81 82  Resp: 18  16 18   Temp: 99.3 F (37.4 C)  99.2 F (37.3 C) 98.3 F (36.8 C)  TempSrc: Oral  Oral Oral  SpO2: 96%  98% 100%  Weight:   74.7 kg (164 lb 9.6 oz)   Height:        Intake/Output Summary (Last 24 hours) at 06/20/2017 1659 Last data filed at 06/20/2017 1000 Gross per 24 hour  Intake 1083 ml  Output -  Net 1083 ml   Filed Weights   06/18/17 2137 06/20/17 0510  Weight: 74.8 kg (165 lb) 74.7 kg (164 lb 9.6 oz)    Examination:   General: deconditioned  Neurology: Awake and alert, non focal  E ENT: no pallor, no icterus, oral mucosa moist Cardiovascular: No JVD. S1-S2 present, rhythmic, no gallops, rubs, or murmurs. No lower extremity edema. Pulmonary: decreased breath sounds bilaterally at bases, adequate air movement, no wheezing, rhonchi or rales. Gastrointestinal. Abdomen with no organomegaly, non tender, no rebound or guarding Skin. No rashes Musculoskeletal: no joint deformities     Data Reviewed: I have personally reviewed following labs and imaging studies  CBC: Recent Labs  Lab 06/17/17 1311 06/18/17 1443  WBC  --  9.5  HGB 11.8* 12.2  HCT  --  40.9  MCV  --  82.6  PLT  --  341   Basic Metabolic Panel: Recent Labs  Lab 06/18/17 1443 06/19/17 0214 06/20/17 1436  NA 137 137 133*  K 4.6 4.4 5.3*  CL 109 106 107  CO2 21* 19* 15*  GLUCOSE 197* 265* 271*  BUN 60* 63* 73*  CREATININE 4.13* 4.70* 5.33*  CALCIUM 8.8* 8.4* 8.1*   GFR: Estimated Creatinine Clearance: 13.3 mL/min (A) (by C-G formula based on SCr of 5.33 mg/dL (H)). Liver Function Tests: No results for input(s): AST, ALT, ALKPHOS, BILITOT, PROT,  ALBUMIN in the last 168 hours. No results for input(s): LIPASE, AMYLASE in the last 168 hours. No results for input(s): AMMONIA in the last 168 hours. Coagulation Profile: No results for input(s): INR, PROTIME in the last 168 hours. Cardiac Enzymes: Recent Labs  Lab 06/18/17 2244 06/19/17 0214 06/19/17 0833  TROPONINI 2.46* 2.20* 2.34*   BNP (last 3 results) No results for input(s): PROBNP in the last 8760 hours. HbA1C: No results for input(s): HGBA1C in the last 72 hours. CBG: Recent Labs  Lab 06/19/17 1719 06/19/17 2202 06/20/17 0732 06/20/17 1119 06/20/17 1629  GLUCAP 237* 370* 255* 167* 215*   Lipid Profile: No results for input(s): CHOL, HDL, LDLCALC, TRIG, CHOLHDL, LDLDIRECT in the last 72 hours. Thyroid Function Tests: No results for input(s): TSH, T4TOTAL, FREET4, T3FREE, THYROIDAB in the last 72 hours. Anemia Panel: No results for input(s): VITAMINB12, FOLATE, FERRITIN, TIBC, IRON, RETICCTPCT in the last 72 hours.    Radiology Studies: I have reviewed all of the imaging during this hospital visit personally     Scheduled Meds: . amLODipine  10 mg Oral Daily  . aspirin  81 mg Oral Daily  . calcitRIOL  0.25 mcg Oral Daily  . cholecalciferol  400 Units Oral Daily  . clopidogrel  75 mg  Oral Q breakfast  . DULoxetine  60 mg Oral Daily  . furosemide  80 mg Intravenous BID  . gabapentin  300 mg Oral BID  . insulin aspart  0-9 Units Subcutaneous TID WC  . insulin glargine  10 Units Subcutaneous QHS  . metoCLOPramide  5 mg Oral TID AC & HS  . metoprolol succinate  50 mg Oral BID  . mirtazapine  15 mg Oral QHS  . mometasone-formoterol  2 puff Inhalation BID  . nortriptyline  30 mg Oral QHS  . pravastatin  20 mg Oral q1800  . sodium chloride flush  3 mL Intravenous Q12H   Continuous Infusions: . sodium chloride       LOS: 2 days        Lindzie Boxx Gerome Apley, MD [facility] Pager [phone]

## 2017-06-20 NOTE — Progress Notes (Signed)
PROGRESS NOTE    Susan Horton  DDU:[id number] DOB: May 04, 1970 DOA: 06/18/2017 PCP: Charlott Rakes, MD    Brief Narrative:  Patient is a 47 year old female with history of CAD post angioplasty, CKD stage IV, chronic diastolic heart failure, asthma, depression, hypertension and type 2 diabetes mellitus admitted for progressive dyspnea and chest pressure. She reported worsening dyspnea and orthopnea over the past several days. Recently she was experiencing an associated chest pressure which led her to go to the ED. On admission blood pressure 116/91, heart rate 106, respiratory 25, oxygen saturation 99%. Her troponin was .7 on admission, the most recent troponin was 2.34 yesterday morning. Chest X-ray showed no overt pulmonary edema and trace bilateral pleural effusions.    Assessment & Plan:   Principal Problem:   Acute on chronic diastolic CHF (congestive heart failure) (HCC) Active Problems:   Uncontrolled type 2 diabetes mellitus with peripheral neuropathy (HCC)   Gastroparesis   Essential hypertension, benign   Coronary artery disease involving native coronary artery of native heart with angina pectoris (HCC)   CKD (chronic kidney disease) stage 4, GFR 15-29 ml/min (HCC)   Chest pain   Adjustment disorder with depressed mood   Moderate persistent asthma   Elevated troponin   Hypertensive urgency   1.  Acute on chronic diastolic heart failure decompensation.  -Chest pain likely to be acute on chronic heart failure due to patient history, chest X-ray, and serial troponin has stabilized with the last 3 readings being 2.46, 2.20, 2.34,  Indicating elevation unlikely to be ischemic in nature -Echocardiogram showed grade 2 diastolic dysfunction with EF of 55-60% -Continue Lasix 80mg  BID  2.  Acute cardiogenic pulmonary edema.  -Continue Lasix 80mg  BID -Continue monitoring with pulse oxometry  3.  Ischemic heart disease. -Continue antiplatelet therapy with clopidogrel and aspirin.    4.  Chronic kidney disease stage IV -Creatinine 4.72 this morning, was 4.13 on admission. BUN increased from 60 to 63.  -Patient refused AM labs due to pain with repeated blood draws. Patient is willing to have blood work done if taken from her peripheral access site. Repeat BMP stat, consider nephrology consult if function worsening  5.  Type 2 diabetes mellitus.  -Continue glucose monitoring and insulin sliding scale.   6.  Hypertension. -Continue amlodipine and metoprolol, as needed hydralazine.   7. Depression. -Continue nortriptyline, mirtazapine and duloxetin.   DVT prophylaxis: heparin   Code Status:  Full code Family Communication: no family at the bedside  Disposition Plan: telemetry   Consultants:     Procedures:  Echocardiogram: Left ventricle: The cavity size was normal. Wall thickness was increased in a pattern of mild LVH. Systolic function was normal. The estimated ejection fraction was in the range of 55% to 60%. Features are consistent with a pseudonormal left ventricular filling pattern, with concomitant abnormal relaxation and increased filling pressure (grade 2 diastolic dysfunction). Mitral valve: There was mild regurgitation. Left atrium: The atrium was mildly dilated. Pericardium, extracardiac: A trivial pericardial effusion was identified.  Antimicrobials:       Subjective: Patient reports feeling better than yesterday and is no longer having chest pain. She states her dyspnea has decreased but still occurs when she gets up to go to the bathroom or move around the room. Her chief complaint is continued orthopnea.   Objective: Vitals:   06/19/17 1535 06/19/17 2206 06/19/17 2318 06/20/17 0510  BP: 127/74 131/71 134/73 109/63  Pulse: 87 94 94 81  Resp: 16 18  16  Temp:  99.3 F (37.4 C)  99.2 F (37.3 C)  TempSrc:  Oral  Oral  SpO2: 100% 96%  98%  Weight:    74.7 kg (164 lb 9.6 oz)  Height:        Intake/Output Summary (Last 24 hours) at  06/20/2017 1003 Last data filed at 06/20/2017 8250 Gross per 24 hour  Intake 1083 ml  Output -  Net 1083 ml   Filed Weights   06/18/17 2137 06/20/17 0510  Weight: 74.8 kg (165 lb) 74.7 kg (164 lb 9.6 oz)    Examination:   General: deconditioned, not in pain Neurology: Awake and alert, non focal  E ENT: mild pallor, no icterus, oral mucosa moist Cardiovascular: JVD present. S1-S2 present, rhythmic, no gallops, rubs, or murmurs. No lower extremity edema but mild tenderness Pulmonary: decreased breath sounds bilaterally, adequate air movement, no wheezing, rhonchi or rales. Gastrointestinal. Abdomen with no organomegaly, non tender, no rebound or guarding Skin. No rashes Musculoskeletal: no joint deformities     Data Reviewed: I have personally reviewed following labs and imaging studies  CBC: Recent Labs  Lab 06/17/17 1311 06/18/17 1443  WBC  --  9.5  HGB 11.8* 12.2  HCT  --  40.9  MCV  --  82.6  PLT  --  539   Basic Metabolic Panel: Recent Labs  Lab 06/18/17 1443 06/19/17 0214  NA 137 137  K 4.6 4.4  CL 109 106  CO2 21* 19*  GLUCOSE 197* 265*  BUN 60* 63*  CREATININE 4.13* 4.70*  CALCIUM 8.8* 8.4*   GFR: Estimated Creatinine Clearance: 15.1 mL/min (A) (by C-G formula based on SCr of 4.7 mg/dL (H)). Liver Function Tests: No results for input(s): AST, ALT, ALKPHOS, BILITOT, PROT, ALBUMIN in the last 168 hours. No results for input(s): LIPASE, AMYLASE in the last 168 hours. No results for input(s): AMMONIA in the last 168 hours. Coagulation Profile: No results for input(s): INR, PROTIME in the last 168 hours. Cardiac Enzymes: Recent Labs  Lab 06/18/17 2244 06/19/17 0214 06/19/17 0833  TROPONINI 2.46* 2.20* 2.34*   BNP (last 3 results) No results for input(s): PROBNP in the last 8760 hours. HbA1C: No results for input(s): HGBA1C in the last 72 hours. CBG: Recent Labs  Lab 06/18/17 2243 06/19/17 0815 06/19/17 1719 06/19/17 2202 06/20/17 0732    GLUCAP 249* 206* 237* 370* 255*   Lipid Profile: No results for input(s): CHOL, HDL, LDLCALC, TRIG, CHOLHDL, LDLDIRECT in the last 72 hours. Thyroid Function Tests: No results for input(s): TSH, T4TOTAL, FREET4, T3FREE, THYROIDAB in the last 72 hours. Anemia Panel: No results for input(s): VITAMINB12, FOLATE, FERRITIN, TIBC, IRON, RETICCTPCT in the last 72 hours.    Radiology Studies: I have reviewed all of the imaging during this hospital visit personally     Scheduled Meds: . amLODipine  10 mg Oral Daily  . aspirin  81 mg Oral Daily  . calcitRIOL  0.25 mcg Oral Daily  . cholecalciferol  400 Units Oral Daily  . clopidogrel  75 mg Oral Q breakfast  . DULoxetine  60 mg Oral Daily  . furosemide  80 mg Intravenous BID  . gabapentin  300 mg Oral BID  . insulin aspart  0-9 Units Subcutaneous TID WC  . insulin glargine  10 Units Subcutaneous QHS  . metoCLOPramide  5 mg Oral TID AC & HS  . metoprolol succinate  50 mg Oral BID  . mirtazapine  15 mg Oral QHS  . mometasone-formoterol  2 puff Inhalation BID  . nortriptyline  30 mg Oral QHS  . pravastatin  20 mg Oral q1800  . sodium chloride flush  3 mL Intravenous Q12H   Continuous Infusions: . sodium chloride       LOS: 2 days    Carita Pian PA-S    [facility] Pager [phone]

## 2017-06-21 DIAGNOSIS — N185 Chronic kidney disease, stage 5: Secondary | ICD-10-CM

## 2017-06-21 DIAGNOSIS — N186 End stage renal disease: Secondary | ICD-10-CM

## 2017-06-21 LAB — PHOSPHORUS: Phosphorus: 5.6 mg/dL — ABNORMAL HIGH (ref 2.5–4.6)

## 2017-06-21 LAB — GLUCOSE, CAPILLARY
GLUCOSE-CAPILLARY: 133 mg/dL — AB (ref 65–99)
GLUCOSE-CAPILLARY: 85 mg/dL (ref 65–99)
Glucose-Capillary: 213 mg/dL — ABNORMAL HIGH (ref 65–99)
Glucose-Capillary: 205 mg/dL — ABNORMAL HIGH (ref 65–99)

## 2017-06-21 LAB — BASIC METABOLIC PANEL
ANION GAP: 10 (ref 5–15)
BUN: 76 mg/dL — ABNORMAL HIGH (ref 6–20)
CALCIUM: 8.6 mg/dL — AB (ref 8.9–10.3)
CHLORIDE: 103 mmol/L (ref 101–111)
CO2: 21 mmol/L — ABNORMAL LOW (ref 22–32)
Creatinine, Ser: 5.62 mg/dL — ABNORMAL HIGH (ref 0.44–1.00)
GFR calc Af Amer: 10 mL/min — ABNORMAL LOW (ref >60)
GFR calc non Af Amer: 8 mL/min — ABNORMAL LOW (ref >60)
Glucose, Bld: 236 mg/dL — ABNORMAL HIGH (ref 65–99)
Potassium: 5.2 mmol/L — ABNORMAL HIGH (ref 3.5–5.1)
SODIUM: 134 mmol/L — AB (ref 135–145)

## 2017-06-21 LAB — IRON AND TIBC
Iron: 43 ug/dL (ref 28–170)
Saturation Ratios: 26 % (ref 10.4–31.8)
TIBC: 167 ug/dL — ABNORMAL LOW (ref 250–450)
UIBC: 124 ug/dL

## 2017-06-21 LAB — FERRITIN: FERRITIN: 1436 ng/mL — AB (ref 11–307)

## 2017-06-21 MED ORDER — CHLORHEXIDINE GLUCONATE CLOTH 2 % EX PADS
6.0000 | MEDICATED_PAD | Freq: Every day | CUTANEOUS | Status: DC
  Administered 2017-06-21: 6 via TOPICAL

## 2017-06-21 NOTE — Consult Note (Addendum)
Hospital Consult VASCULAR SURGERY ASSESSMENT & PLAN:   POORLY MATURING LEFT UPPER ARM FISTULA: This patient had a left brachiocephalic AV fistula placed in January of this year.  Patient was seen in follow-up on 03/10/2017 by Dr. Ruta Hinds and at that time it appeared that the fistula is maturing nicely and had adequate diameters.  There were some increased velocities in the fistula in the distal upper arm.  On exam this morning I felt that the fistula did appear to be reasonable in size and that it would be okay to try to cannulate the fistula for dialysis.  However, unfortunately this was not successful.  This reason, I have recommended we proceed with a fistulogram tomorrow to further evaluate her left upper arm fistula.  This has been scheduled for tomorrow with Dr. [name].   Deitra Mayo, MD, [place] [phone] Office: [phone]  Reason for Consult:  No complaints Requesting Physician:  Joelyn Oms MRN #:  [id number]  History of Present Illness: This is a 47 y.o. female who is s/p left BC AVF January 2019 by Dr. [name].  She was seen in the office at the end of February and it was maturing nicely.  She was not on HD at that time.  She was admitted to the hospital a couple of days ago with increasing shortness of breath and volume overload.  They feel she is now in need of dialysis and we are asked to evaluate her fistula to see if it is mature.     Past Medical History:  Diagnosis Date  . Anemia   . Asthma   . CAD (coronary artery disease)    DES to mid LAD July 2018, residual moderate RCA disease  . Cataract   . CKD (chronic kidney disease) stage 4, GFR 15-29 ml/min (HCC)   . DVT (deep venous thrombosis) ([place])    1996 during pregnancy, 2015 left leg  . Essential hypertension 12/11/2015  . Gastroparesis   . GERD (gastroesophageal reflux disease)   . Hidradenitis   . Migraine   . Neuropathy   . Type 2 diabetes mellitus ([place])   . Type II diabetes mellitus ([place])     Past  Surgical History:  Procedure Laterality Date  . ABDOMINOPLASTY    . APPENDECTOMY  1995  . AV FISTULA PLACEMENT Left 02/01/2017   Procedure: ARTERIOVENOUS BRACHIOCEPHALIC (AV) FISTULA CREATION;  Surgeon: [name], MD;  Location: [place];  Service: Vascular;  Laterality: Left;  . CHOLECYSTECTOMY  1993  . CORONARY ANGIOPLASTY WITH STENT PLACEMENT    . CORONARY STENT INTERVENTION N/A 08/05/2016   Procedure: Coronary Stent Intervention;  Surgeon: [name], MD;  Location: [place] [facility];  Service: Cardiovascular;  Laterality: N/A;  . EXPLORATORY LAPAROTOMY  08/14/2005   lysis of adhesions, drainage of tubo-ovarian abscess  . HYDRADENITIS EXCISION  02/08/2011   Procedure: EXCISION HYDRADENITIS GROIN;  Surgeon: [name], MD;  Location: [place];  Service: General;  Laterality: N/A;  Excisioin of Hidradenitis Left groin  . INGUINAL HIDRADENITIS EXCISION  07/06/2010   bilateral  . LEFT HEART CATH AND CORONARY ANGIOGRAPHY N/A 08/05/2016   Procedure: Left Heart Cath and Coronary Angiography;  Surgeon: [name], MD;  Location: [facility];  Service: Cardiovascular;  Laterality: N/A;  . REDUCTION MAMMAPLASTY  2002  . TONSILLECTOMY    . TUBAL LIGATION  1996  . VAGINAL HYSTERECTOMY  08/04/2005   and cysto    Allergies  Allergen Reactions  .  Penicillins Anaphylaxis, Itching, Swelling, Rash and Other (See Comments)    Swelling of throat & whole mouth  Has patient had a PCN reaction causing immediate rash, facial/tongue/throat swelling, SOB or lightheadedness with hypotension: Yes Has patient had a PCN reaction causing severe rash involving mucus membranes or skin necrosis: No Has patient had a PCN reaction that required hospitalization: No Has patient had a PCN reaction occurring within the last 10 years: No If all of the above answers are "NO", then may proceed with Cephalosporin use.  [name] Lisinopril Cough    Prior to Admission medications     Medication Sig Start Date End Date Taking? Authorizing Provider  albuterol (PROVENTIL HFA;VENTOLIN HFA) 108 (90 BASE) MCG/ACT inhaler Inhale 1-2 puffs into the lungs every 6 (six) hours as needed for wheezing or shortness of breath.    Yes [provider]  amLODipine (NORVASC) 10 MG tablet TAKE 1 TABLET BY MOUTH DAILY 03/18/17  Yes [name], MD  aspirin 81 MG chewable tablet Chew 1 tablet (81 mg total) by mouth daily. 08/07/16  Yes Cheryln Manly, NP  Calcifediol ER (RAYALDEE) 30 MCG CPCR Take 30 mcg by mouth daily.    Yes [provider]  calcitRIOL (ROCALTROL) 0.25 MCG capsule Take 0.25 mcg by mouth daily. 05/26/16  Yes [provider]  Cholecalciferol (VITAMIN D-3 PO) Take 1 capsule by mouth daily.   Yes [provider]  clopidogrel (PLAVIX) 75 MG tablet Take 1 tablet (75 mg total) by mouth daily with breakfast. 03/23/17  Yes Almyra Deforest, PA  cyclobenzaprine (FLEXERIL) 5 MG tablet Take 1 tablet (5 mg total) by mouth at bedtime as needed for muscle spasms. 01/05/17  Yes Patel, Donika K, DO  DULoxetine (CYMBALTA) 60 MG capsule Take 1 capsule (60 mg total) by mouth daily. 12/08/16  Yes [name], MD  Ferrous Sulfate (IRON) 325 (65 Fe) MG TABS Take 325 mg by mouth daily. 11/25/15  Yes Noel, Tiffany S, PA-C  Fluticasone-Salmeterol (ADVAIR) 250-50 MCG/DOSE AEPB Inhale 1 puff into the lungs 2 (two) times daily. 06/10/17  Yes Charlott Rakes, MD  furosemide (LASIX) 80 MG tablet Take 160 mg by mouth 2 (two) times daily. 06/10/17  Yes [provider]  gabapentin (NEURONTIN) 300 MG capsule TAKE 2 CAPSULES BY MOUTH 3 TIMES DAILY. Patient taking differently: Take 600 mg by mouth two times a day 03/18/17  Yes Jegede, Olugbemiga E, MD  Insulin Glargine (TOUJEO SOLOSTAR) 300 UNIT/ML SOPN Inject 60 Units into the skin 2 (two) times daily. 05/23/17  Yes Philemon Kingdom, MD  insulin regular (HUMULIN R) 100 units/mL injection Inject 25-35 into the skin 3  (three) times daily before meals. Sliding scale Patient taking differently: Inject 25-35 Units into the skin See admin instructions. Inject 25-35 units into the skin 3 (three) times daily before meals, per sliding scale 05/23/17  Yes Philemon Kingdom, MD  lidocaine (XYLOCAINE) 5 % ointment Apply to feet twice daily as needed for severe pain.  Use gloves. 06/03/17  Yes Patel, Donika K, DO  losartan (COZAAR) 100 MG tablet TAKE 1 TABLET BY MOUTH DAILY 03/18/17  Yes Jegede, Olugbemiga E, MD  lovastatin (MEVACOR) 20 MG tablet Take 20 mg by mouth at bedtime.    Yes [provider]  metoCLOPramide (REGLAN) 5 MG tablet TAKE 1 TABLET BY MOUTH 4 TIMES DAILY BEFORE MEALS AND AT BEDTIME. 03/18/17  Yes Jegede, Olugbemiga E, MD  metoprolol succinate (TOPROL-XL) 50 MG 24 hr tablet Take 50 mg by mouth 2 (  two) times daily. 06/08/17  Yes [provider]  mirtazapine (REMERON SOL-TAB) 15 MG disintegrating tablet TAKE 1 TABLET BY MOUTH AT BEDTIME. 03/18/17  Yes Jegede, Olugbemiga E, MD  montelukast (SINGULAIR) 10 MG tablet Take 10 mg by mouth daily as needed (allergies).    Yes [provider]  nitroGLYCERIN (NITROSTAT) 0.4 MG SL tablet Place 1 tablet (0.4 mg total) under the tongue every 5 (five) minutes as needed. Patient taking differently: Place 0.4 mg under the tongue every 5 (five) minutes as needed for chest pain.  08/06/16  Yes Reino Bellis B, NP  nortriptyline (PAMELOR) 10 MG capsule Take 3 tablet at bedtime. Patient taking differently: Take 30 mg by mouth at bedtime.  06/03/17  Yes Patel, Donika K, DO  ondansetron (ZOFRAN) 4 MG tablet Take 1 tablet (4 mg total) by mouth daily as needed for nausea or vomiting. 08/30/16 08/30/17 Yes Vann, Jessica U, DO  pantoprazole (PROTONIX) 40 MG tablet TAKE 1 TABLET BY MOUTH DAILY. 03/18/17  Yes [name], MD  senna-docusate (SENOKOT-S) 8.6-50 MG tablet Take 1 tablet by mouth 2 (two) times daily. Patient taking differently: Take 1 tablet by mouth 2  (two) times daily as needed for mild constipation.  08/30/16  Yes Vann, Jessica U, DO  trimethoprim-polymyxin b (POLYTRIM) ophthalmic solution INSTILL ONE DROP INTO BOTH EYES 4 TIMES A DAY FOR 2 DAYS AFTER EACH MONTHLY EYE INJECTION 06/16/17  Yes [provider]    Social History   Socioeconomic History  . Marital status: Single    Spouse name: Not on file  . Number of children: 3  . Years of education: 69  . Highest education level: Not on file  Occupational History  . Occupation: Curator  Social Needs  . Financial resource strain: Not on file  . Food insecurity:    Worry: Not on file    Inability: Not on file  . Transportation needs:    Medical: Not on file    Non-medical: Not on file  Tobacco Use  . Smoking status: Former Smoker    Years: 20.00    Types: Cigarettes    Last attempt to quit: 11/2016    Years since quitting: 0.6  . Smokeless tobacco: Never Used  . Tobacco comment: Smokes on rare occasions  Substance and Sexual Activity  . Alcohol use: No    Alcohol/week: 0.0 oz  . Drug use: No  . Sexual activity: Not on file  Lifestyle  . Physical activity:    Days per week: Not on file    Minutes per session: Not on file  . Stress: Not on file  Relationships  . Social connections:    Talks on phone: Not on file    Gets together: Not on file    Attends religious service: Not on file    Active member of club or organization: Not on file    Attends meetings of clubs or organizations: Not on file    Relationship status: Not on file  . Intimate partner violence:    Fear of current or ex partner: Not on file    Emotionally abused: Not on file    Physically abused: Not on file    Forced sexual activity: Not on file  Other Topics Concern  . Not on file  Social History Narrative   Lives with daughter in a 2 story home.  Has 3 children.  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  Education: college.  Family History  Problem  Relation Age of Onset  . Hypertension Father   . Hypertension Sister   . Hypertension Brother   . Breast cancer Cousin        x2    ROS: [x]  Positive   [ ]  Negative   [ ]  All sytems reviewed and are negative  Cardiac: []  chest pain/pressure []  palpitations [x]  SOB []  DOE [x]  CAD with hx of DES  Vascular: []  pain in legs while walking []  pain in legs at rest []  pain in legs at night []  non-healing ulcers [x]  hx of DVT-1996/2015 []  swelling in legs  Pulmonary: []  productive cough [x]  asthma/wheezing []  home O2  Neurologic: []  weakness in []  arms []  legs []  numbness in []  arms []  legs []  hx of CVA []  mini stroke [] difficulty speaking or slurred speech []  temporary loss of vision in one eye []  dizziness [x]  hx migraines  Hematologic: []  hx of cancer []  bleeding problems []  problems with blood clotting easily  Endocrine:   [x]  diabetes []  thyroid disease  GI []  vomiting blood []  blood in stool [x]  GERD  GU: [x]  CKD/renal failure []  HD--[]  M/W/F or []  T/T/S []  burning with urination []  blood in urine  Psychiatric: []  anxiety []  depression  Musculoskeletal: []  arthritis []  joint pain  Integumentary: []  rashes []  ulcers  Constitutional: []  fever []  chills   Physical Examination  Vitals:   06/20/17 2229 06/21/17 0438  BP: 139/76 119/72  Pulse: 87 78  Resp:  19  Temp:  98.9 F (37.2 C)  SpO2:  98%   Body mass index is 27.77 kg/m.  General:  WDWN in NAD Gait: Not observed HENT: WNL, normocephalic Pulmonary: normal non-labored breathing Skin: no rashes.  Vascular Exam/Pulses: easily palpable thrill LUA AVF  Musculoskeletal: no muscle wasting or atrophy  Neurologic: A&O X 3;  No focal weakness or paresthesias are detected; speech is fluent/normal Psychiatric:  The pt has Normal affect.   CBC    Component Value Date/Time   WBC 9.5 06/18/2017 1443   RBC 4.95 06/18/2017 1443   HGB 12.2 06/18/2017 1443   HGB 9.2 (L) 07/26/2016 0000    HCT 40.9 06/18/2017 1443   HCT 30.7 (L) 07/26/2016 0000   PLT 356 06/18/2017 1443   PLT 474 (H) 07/26/2016 0000   MCV 82.6 06/18/2017 1443   MCV 82 07/26/2016 0000   MCH 24.6 (L) 06/18/2017 1443   MCHC 29.8 (L) 06/18/2017 1443   RDW 14.4 06/18/2017 1443   RDW 13.6 07/26/2016 0000   LYMPHSABS 2.9 07/26/2016 0000   MONOABS 0.4 11/20/2015 1843   EOSABS 0.1 07/26/2016 0000   BASOSABS 0.0 07/26/2016 0000    BMET    Component Value Date/Time   NA 134 (L) 06/21/2017 0559   NA 132 (L) 07/26/2016 0000   K 5.2 (H) 06/21/2017 0559   CL 103 06/21/2017 0559   CO2 21 (L) 06/21/2017 0559   GLUCOSE 236 (H) 06/21/2017 0559   BUN 76 (H) 06/21/2017 0559   BUN 39 (H) 07/26/2016 0000   CREATININE 5.62 (H) 06/21/2017 0559   CALCIUM 8.6 (L) 06/21/2017 0559   GFRNONAA 8 (L) 06/21/2017 0559   GFRAA 10 (L) 06/21/2017 0559    COAGS: Lab Results  Component Value Date   INR 0.91 02/01/2017   INR 0.9 07/26/2016   INR 1.07 10/23/2014     Non-Invasive Vascular Imaging:   Upper extremity duplex 03/10/17: Diameter:  0.63cm-1.04cm  Depth:  0.18cm-0.86cm  Statin:  Yes.   Beta Blocker:  Yes.   Aspirin:  No. ACEI:  No. ARB:  Yes.   CCB use:  Yes Other antiplatelets/anticoagulants:  Yes.   Plavix   ASSESSMENT/PLAN: This is a 47 y.o. female with CKD 5 approaching ESRD who is s/p left BC AVF 02/01/17   -pt's left arm fistula is easily palpable and there is an excellent thrill present.  On duplex in February, the diameter was adequate as well as depth to mid upper arm.  Given this, would attempt to cannulate fistula.     Leontine Locket, PA-C [facility] [phone]

## 2017-06-21 NOTE — Progress Notes (Deleted)
Hospital Consult    Reason for Consult:  No complaints Requesting Physician:  Joelyn Oms MRN #:  [id number]  History of Present Illness: This is a 47 y.o. female who is s/p left BC AVF January 2019 by Dr. [name].  She was seen in the office at the end of February and it was maturing nicely.  She was not on HD at that time.  She was admitted to the hospital a couple of days ago with increasing shortness of breath and volume overload.  They feel she is now in need of dialysis and we are asked to evaluate her fistula to see if it is mature.     Past Medical History:  Diagnosis Date  . Anemia   . Asthma   . CAD (coronary artery disease)    DES to mid LAD July 2018, residual moderate RCA disease  . Cataract   . CKD (chronic kidney disease) stage 4, GFR 15-29 ml/min (HCC)   . DVT (deep venous thrombosis) ([place])    1996 during pregnancy, 2015 left leg  . Essential hypertension 12/11/2015  . Gastroparesis   . GERD (gastroesophageal reflux disease)   . Hidradenitis   . Migraine   . Neuropathy   . Type 2 diabetes mellitus ([place])   . Type II diabetes mellitus ([place])     Past Surgical History:  Procedure Laterality Date  . ABDOMINOPLASTY    . APPENDECTOMY  1995  . AV FISTULA PLACEMENT Left 02/01/2017   Procedure: ARTERIOVENOUS BRACHIOCEPHALIC (AV) FISTULA CREATION;  Surgeon: [name], MD;  Location: [facility];  Service: Vascular;  Laterality: Left;  . CHOLECYSTECTOMY  1993  . CORONARY ANGIOPLASTY WITH STENT PLACEMENT    . CORONARY STENT INTERVENTION N/A 08/05/2016   Procedure: Coronary Stent Intervention;  Surgeon: [name], MD;  Location: [facility];  Service: Cardiovascular;  Laterality: N/A;  . EXPLORATORY LAPAROTOMY  08/14/2005   lysis of adhesions, drainage of tubo-ovarian abscess  . HYDRADENITIS EXCISION  02/08/2011   Procedure: EXCISION HYDRADENITIS GROIN;  Surgeon: [name], MD;  Location: [facility];  Service: General;  Laterality: N/A;   Excisioin of Hidradenitis Left groin  . INGUINAL HIDRADENITIS EXCISION  07/06/2010   bilateral  . LEFT HEART CATH AND CORONARY ANGIOGRAPHY N/A 08/05/2016   Procedure: Left Heart Cath and Coronary Angiography;  Surgeon: [name], MD;  Location: [facility];  Service: Cardiovascular;  Laterality: N/A;  . REDUCTION MAMMAPLASTY  2002  . TONSILLECTOMY    . TUBAL LIGATION  1996  . VAGINAL HYSTERECTOMY  08/04/2005   and cysto    Allergies  Allergen Reactions  . Penicillins Anaphylaxis, Itching, Swelling, Rash and Other (See Comments)    Swelling of throat & whole mouth  Has patient had a PCN reaction causing immediate rash, facial/tongue/throat swelling, SOB or lightheadedness with hypotension: Yes Has patient had a PCN reaction causing severe rash involving mucus membranes or skin necrosis: No Has patient had a PCN reaction that required hospitalization: No Has patient had a PCN reaction occurring within the last 10 years: No If all of the above answers are "NO", then may proceed with Cephalosporin use.  [name] Lisinopril Cough    Prior to Admission medications   Medication Sig Start Date End Date Taking? Authorizing Provider  albuterol (PROVENTIL HFA;VENTOLIN HFA) 108 (90 BASE) MCG/ACT inhaler Inhale 1-2 puffs into the lungs every 6 (six) hours as needed for wheezing or shortness of breath.    Yes [provider]  amLODipine (NORVASC) 10 MG tablet TAKE 1 TABLET BY MOUTH DAILY 03/18/17  Yes [name], MD  aspirin 81 MG chewable tablet Chew 1 tablet (81 mg total) by mouth daily. 08/07/16  Yes Cheryln Manly, NP  Calcifediol ER (RAYALDEE) 30 MCG CPCR Take 30 mcg by mouth daily.    Yes [provider]  calcitRIOL (ROCALTROL) 0.25 MCG capsule Take 0.25 mcg by mouth daily. 05/26/16  Yes [provider]  Cholecalciferol (VITAMIN D-3 PO) Take 1 capsule by mouth daily.   Yes [provider]  clopidogrel (PLAVIX) 75 MG tablet Take 1 tablet (75  mg total) by mouth daily with breakfast. 03/23/17  Yes Almyra Deforest, PA  cyclobenzaprine (FLEXERIL) 5 MG tablet Take 1 tablet (5 mg total) by mouth at bedtime as needed for muscle spasms. 01/05/17  Yes Patel, Donika K, DO  DULoxetine (CYMBALTA) 60 MG capsule Take 1 capsule (60 mg total) by mouth daily. 12/08/16  Yes [name], MD  Ferrous Sulfate (IRON) 325 (65 Fe) MG TABS Take 325 mg by mouth daily. 11/25/15  Yes Noel, Tiffany S, PA-C  Fluticasone-Salmeterol (ADVAIR) 250-50 MCG/DOSE AEPB Inhale 1 puff into the lungs 2 (two) times daily. 06/10/17  Yes Charlott Rakes, MD  furosemide (LASIX) 80 MG tablet Take 160 mg by mouth 2 (two) times daily. 06/10/17  Yes [provider]  gabapentin (NEURONTIN) 300 MG capsule TAKE 2 CAPSULES BY MOUTH 3 TIMES DAILY. Patient taking differently: Take 600 mg by mouth two times a day 03/18/17  Yes Jegede, Olugbemiga E, MD  Insulin Glargine (TOUJEO SOLOSTAR) 300 UNIT/ML SOPN Inject 60 Units into the skin 2 (two) times daily. 05/23/17  Yes Philemon Kingdom, MD  insulin regular (HUMULIN R) 100 units/mL injection Inject 25-35 into the skin 3 (three) times daily before meals. Sliding scale Patient taking differently: Inject 25-35 Units into the skin See admin instructions. Inject 25-35 units into the skin 3 (three) times daily before meals, per sliding scale 05/23/17  Yes Philemon Kingdom, MD  lidocaine (XYLOCAINE) 5 % ointment Apply to feet twice daily as needed for severe pain.  Use gloves. 06/03/17  Yes Patel, Donika K, DO  losartan (COZAAR) 100 MG tablet TAKE 1 TABLET BY MOUTH DAILY 03/18/17  Yes Jegede, Olugbemiga E, MD  lovastatin (MEVACOR) 20 MG tablet Take 20 mg by mouth at bedtime.    Yes [provider]  metoCLOPramide (REGLAN) 5 MG tablet TAKE 1 TABLET BY MOUTH 4 TIMES DAILY BEFORE MEALS AND AT BEDTIME. 03/18/17  Yes Jegede, Olugbemiga E, MD  metoprolol succinate (TOPROL-XL) 50 MG 24 hr tablet Take 50 mg by mouth 2 (two) times daily. 06/08/17  Yes  [provider]  mirtazapine (REMERON SOL-TAB) 15 MG disintegrating tablet TAKE 1 TABLET BY MOUTH AT BEDTIME. 03/18/17  Yes Jegede, Olugbemiga E, MD  montelukast (SINGULAIR) 10 MG tablet Take 10 mg by mouth daily as needed (allergies).    Yes [provider]  nitroGLYCERIN (NITROSTAT) 0.4 MG SL tablet Place 1 tablet (0.4 mg total) under the tongue every 5 (five) minutes as needed. Patient taking differently: Place 0.4 mg under the tongue every 5 (five) minutes as needed for chest pain.  08/06/16  Yes Reino Bellis B, NP  nortriptyline (PAMELOR) 10 MG capsule Take 3 tablet at bedtime. Patient taking differently: Take 30 mg by mouth at bedtime.  06/03/17  Yes Patel, Donika K, DO  ondansetron (ZOFRAN) 4 MG tablet Take 1 tablet (4 mg total) by mouth daily  as needed for nausea or vomiting. 08/30/16 08/30/17 Yes Vann, Jessica U, DO  pantoprazole (PROTONIX) 40 MG tablet TAKE 1 TABLET BY MOUTH DAILY. 03/18/17  Yes [name], MD  senna-docusate (SENOKOT-S) 8.6-50 MG tablet Take 1 tablet by mouth 2 (two) times daily. Patient taking differently: Take 1 tablet by mouth 2 (two) times daily as needed for mild constipation.  08/30/16  Yes Vann, Jessica U, DO  trimethoprim-polymyxin b (POLYTRIM) ophthalmic solution INSTILL ONE DROP INTO BOTH EYES 4 TIMES A DAY FOR 2 DAYS AFTER EACH MONTHLY EYE INJECTION 06/16/17  Yes [provider]    Social History   Socioeconomic History  . Marital status: Single    Spouse name: Not on file  . Number of children: 3  . Years of education: 61  . Highest education level: Not on file  Occupational History  . Occupation: Curator  Social Needs  . Financial resource strain: Not on file  . Food insecurity:    Worry: Not on file    Inability: Not on file  . Transportation needs:    Medical: Not on file    Non-medical: Not on file  Tobacco Use  . Smoking status: Former Smoker    Years: 20.00    Types: Cigarettes    Last attempt to  quit: 11/2016    Years since quitting: 0.6  . Smokeless tobacco: Never Used  . Tobacco comment: Smokes on rare occasions  Substance and Sexual Activity  . Alcohol use: No    Alcohol/week: 0.0 oz  . Drug use: No  . Sexual activity: Not on file  Lifestyle  . Physical activity:    Days per week: Not on file    Minutes per session: Not on file  . Stress: Not on file  Relationships  . Social connections:    Talks on phone: Not on file    Gets together: Not on file    Attends religious service: Not on file    Active member of club or organization: Not on file    Attends meetings of clubs or organizations: Not on file    Relationship status: Not on file  . Intimate partner violence:    Fear of current or ex partner: Not on file    Emotionally abused: Not on file    Physically abused: Not on file    Forced sexual activity: Not on file  Other Topics Concern  . Not on file  Social History Narrative   Lives with daughter in a 2 story home.  Has 3 children.  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  Education: college.      Family History  Problem Relation Age of Onset  . Hypertension Father   . Hypertension Sister   . Hypertension Brother   . Breast cancer Cousin        x2    ROS: [x]  Positive   [ ]  Negative   [ ]  All sytems reviewed and are negative  Cardiac: []  chest pain/pressure []  palpitations [x]  SOB []  DOE [x]  CAD with hx of DES  Vascular: []  pain in legs while walking []  pain in legs at rest []  pain in legs at night []  non-healing ulcers [x]  hx of DVT-1996/2015 []  swelling in legs  Pulmonary: []  productive cough [x]  asthma/wheezing []  home O2  Neurologic: []  weakness in []  arms []  legs []  numbness in []  arms []  legs []  hx of CVA []  mini stroke [] difficulty speaking or slurred  speech []  temporary loss of vision in one eye []  dizziness [x]  hx migraines  Hematologic: []  hx of cancer []  bleeding problems []  problems with blood  clotting easily  Endocrine:   [x]  diabetes []  thyroid disease  GI []  vomiting blood []  blood in stool [x]  GERD  GU: [x]  CKD/renal failure []  HD--[]  M/W/F or []  T/T/S []  burning with urination []  blood in urine  Psychiatric: []  anxiety []  depression  Musculoskeletal: []  arthritis []  joint pain  Integumentary: []  rashes []  ulcers  Constitutional: []  fever []  chills   Physical Examination  Vitals:   06/20/17 2229 06/21/17 0438  BP: 139/76 119/72  Pulse: 87 78  Resp:  19  Temp:  98.9 F (37.2 C)  SpO2:  98%   Body mass index is 27.77 kg/m.  General:  WDWN in NAD Gait: Not observed HENT: WNL, normocephalic Pulmonary: normal non-labored breathing Skin: no rashes.  Vascular Exam/Pulses: easily palpable thrill LUA AVF  Musculoskeletal: no muscle wasting or atrophy  Neurologic: A&O X 3;  No focal weakness or paresthesias are detected; speech is fluent/normal Psychiatric:  The pt has Normal affect.   CBC    Component Value Date/Time   WBC 9.5 06/18/2017 1443   RBC 4.95 06/18/2017 1443   HGB 12.2 06/18/2017 1443   HGB 9.2 (L) 07/26/2016 0000   HCT 40.9 06/18/2017 1443   HCT 30.7 (L) 07/26/2016 0000   PLT 356 06/18/2017 1443   PLT 474 (H) 07/26/2016 0000   MCV 82.6 06/18/2017 1443   MCV 82 07/26/2016 0000   MCH 24.6 (L) 06/18/2017 1443   MCHC 29.8 (L) 06/18/2017 1443   RDW 14.4 06/18/2017 1443   RDW 13.6 07/26/2016 0000   LYMPHSABS 2.9 07/26/2016 0000   MONOABS 0.4 11/20/2015 1843   EOSABS 0.1 07/26/2016 0000   BASOSABS 0.0 07/26/2016 0000    BMET    Component Value Date/Time   NA 134 (L) 06/21/2017 0559   NA 132 (L) 07/26/2016 0000   K 5.2 (H) 06/21/2017 0559   CL 103 06/21/2017 0559   CO2 21 (L) 06/21/2017 0559   GLUCOSE 236 (H) 06/21/2017 0559   BUN 76 (H) 06/21/2017 0559   BUN 39 (H) 07/26/2016 0000   CREATININE 5.62 (H) 06/21/2017 0559   CALCIUM 8.6 (L) 06/21/2017 0559   GFRNONAA 8 (L) 06/21/2017 0559   GFRAA 10 (L) 06/21/2017 0559      COAGS: Lab Results  Component Value Date   INR 0.91 02/01/2017   INR 0.9 07/26/2016   INR 1.07 10/23/2014     Non-Invasive Vascular Imaging:   Upper extremity duplex 03/10/17: Diameter:  0.63cm-1.04cm  Depth:  0.18cm-0.86cm  Statin:  Yes.   Beta Blocker:  Yes.   Aspirin:  No. ACEI:  No. ARB:  Yes.   CCB use:  Yes Other antiplatelets/anticoagulants:  Yes.   Plavix   ASSESSMENT/PLAN: This is a 47 y.o. female with CKD 5 approaching ESRD who is s/p left BC AVF 02/01/17   -pt's left arm fistula is easily palpable and there is an excellent thrill present.  On duplex in February, the diameter was adequate as well as depth to mid upper arm.  Given this, would attempt to cannulate fistula.     Leontine Locket, PA-C [facility] [phone]

## 2017-06-21 NOTE — Progress Notes (Signed)
PROGRESS NOTE    Susan Horton  KXF:[id number] DOB: [birth year] DOA: 06/18/2017 PCP: Charlott Rakes, MD    Brief Narrative:  47 year old female who presented with dyspnea, orthopnea and chest discomfort. She does have the significant past medical history for coronary artery disease status post angioplasty, chronic kidney disease stage IV, chronic diastolic heart failure, asthma, depression, hypertension and type 2 diabetes mellitus. Shereported worsening dyspnea over the last several days prior to hospitalization, more severe over the last 24 hours, associated with chest pressure and orthopnea. Initial physical examination blood pressure 116/91, heart rate 106, respiratory 25, oxygen saturation 99%.Moist mucous membranes, lungs with decreased breath sounds bilaterally, bilateral rales, heart S1-S2 present and rhythmic, positive JVD, abdomen soft nontender, nondistended, lower extremities with no edema, left upper extremity AV fistula with positive bruit and thrill.Sodium 137, potassium 4.6, chloride 109, bicarb 21, glucose 197, BUN 60, creatinine 4.13, white count 9.5, hemoglobin 12.2, hematocrit 40.9, platelets 356,troponin II 0.46.Chest x-ray hypoinflated, increased interstitial markings bilaterally with fluid in the right fissure, small bibasilar pleural effusions. EKG sinus rhythm 112 bpm, normal axis, normal intervals, positive LVH per EKG criteria.  Patient was admitted to the hospital with working diagnosis of acute on chronic decompensation of diastolic heart failure,complicated by acute cardiogenic pulmonary edema.   Assessment & Plan:   Principal Problem:   Acute on chronic diastolic CHF (congestive heart failure) (HCC) Active Problems:   Uncontrolled type 2 diabetes mellitus with peripheral neuropathy (HCC)   Gastroparesis   Essential hypertension, benign   Coronary artery disease involving native coronary artery of native heart with angina pectoris (HCC)   CKD (chronic  kidney disease) stage 4, GFR 15-29 ml/min (HCC)   Chest pain   Adjustment disorder with depressed mood   Moderate persistent asthma   Elevated troponin   Hypertensive urgency  1.Acute on chronic diastolic heart failure decompensation due to progressive renal disease. Persistent volume overload, patient will have ultrafiltration today, will continue to follow on symptoms and fluid balance. Echocardiogram with preserved LV systolic function, will continue blood pressure control with metoprolol.   2.Acute cardiogenic pulmonary edema. Reports improved dyspnea but still not back to baseline, for UF today per HD, will continue oxymetry monitoring, oxygen saturation 99% on room air.  3.Ischemic heart disease.Acute coronary syndrome has been ruled out, continue medical therapy with dual antiplatelet therapy with asa- clopidogrel and statin therapy with pravastatin.   4.Chronic kidney disease stage IV now progressed to end stage renal disease. Patient will have dialysis today with ultrafiltration, patient will need an outpatient HD unit to be arranged, continue to follow with [facility] for renal transplant. Continue calcitriol and vitamin D   5.Type 2 diabetes mellitus. continue glucose cover and monitoring with insulin sliding scale. Basal insulin 10 units plus insulin sliding scale, capillary glucose 167, 215, 203, 213, 133.   6.Hypertension.Continue metoprolol, amlodipine has been discontinued.  7. Depression. Continue with nortriptyline, mirtazapine and duloxetin.  DVT prophylaxis:heparin Code Status:full Family Communication:I spoke with patient's family at the bedside and all questions were addressed.  Disposition Plan:telemetry   Consultants:    Procedures:    Antimicrobials:    Subjective: Dyspnea has been improving but not back to baseline, continue to have orthopnea, no nausea or vomiting, no chest pain.   Objective: Vitals:    06/20/17 2104 06/20/17 2229 06/21/17 0438 06/21/17 1137  BP: 132/81 139/76 119/72   Pulse: 86 87 78   Resp:   19   Temp: 98.6 F (37 C)  98.9  F (37.2 C)   TempSrc: Oral  Oral   SpO2: 98%  98% 99%  Weight:   75.7 kg (166 lb 14.4 oz)   Height:        Intake/Output Summary (Last 24 hours) at 06/21/2017 1410 Last data filed at 06/20/2017 2235 Gross per 24 hour  Intake 410 ml  Output -  Net 410 ml   Filed Weights   06/18/17 2137 06/20/17 0510 06/21/17 0438  Weight: 74.8 kg (165 lb) 74.7 kg (164 lb 9.6 oz) 75.7 kg (166 lb 14.4 oz)    Examination:   General: Not in pain or dyspnea, deconditioned  Neurology: Awake and alert, non focal  E ENT: mild pallor, no icterus, oral mucosa moist Cardiovascular: No JVD. S1-S2 present, rhythmic, no gallops, rubs, or murmurs. No lower extremity edema. Pulmonary: decreased breath sounds bilaterally at bases, adequate air movement, no wheezing or rhonchi, positive bilateral rales at bases. Gastrointestinal. Abdomen with no organomegaly, non tender, no rebound or guarding Skin. No rashes Musculoskeletal: no joint deformities     Data Reviewed: I have personally reviewed following labs and imaging studies  CBC: Recent Labs  Lab 06/17/17 1311 06/18/17 1443  WBC  --  9.5  HGB 11.8* 12.2  HCT  --  40.9  MCV  --  82.6  PLT  --  768   Basic Metabolic Panel: Recent Labs  Lab 06/18/17 1443 06/19/17 0214 06/20/17 1436 06/21/17 0559  NA 137 137 133* 134*  K 4.6 4.4 5.3* 5.2*  CL 109 106 107 103  CO2 21* 19* 15* 21*  GLUCOSE 197* 265* 271* 236*  BUN 60* 63* 73* 76*  CREATININE 4.13* 4.70* 5.33* 5.62*  CALCIUM 8.8* 8.4* 8.1* 8.6*   GFR: Estimated Creatinine Clearance: 12.7 mL/min (A) (by C-G formula based on SCr of 5.62 mg/dL (H)). Liver Function Tests: No results for input(s): AST, ALT, ALKPHOS, BILITOT, PROT, ALBUMIN in the last 168 hours. No results for input(s): LIPASE, AMYLASE in the last 168 hours. No results for input(s):  AMMONIA in the last 168 hours. Coagulation Profile: No results for input(s): INR, PROTIME in the last 168 hours. Cardiac Enzymes: Recent Labs  Lab 06/18/17 2244 06/19/17 0214 06/19/17 0833  TROPONINI 2.46* 2.20* 2.34*   BNP (last 3 results) No results for input(s): PROBNP in the last 8760 hours. HbA1C: No results for input(s): HGBA1C in the last 72 hours. CBG: Recent Labs  Lab 06/20/17 1119 06/20/17 1629 06/20/17 2102 06/21/17 0754 06/21/17 1124  GLUCAP 167* 215* 203* 213* 133*   Lipid Profile: No results for input(s): CHOL, HDL, LDLCALC, TRIG, CHOLHDL, LDLDIRECT in the last 72 hours. Thyroid Function Tests: No results for input(s): TSH, T4TOTAL, FREET4, T3FREE, THYROIDAB in the last 72 hours. Anemia Panel: No results for input(s): VITAMINB12, FOLATE, FERRITIN, TIBC, IRON, RETICCTPCT in the last 72 hours.    Radiology Studies: I have reviewed all of the imaging during this hospital visit personally     Scheduled Meds: . aspirin  81 mg Oral Daily  . calcitRIOL  0.25 mcg Oral Daily  . Chlorhexidine Gluconate Cloth  6 each Topical Q0600  . cholecalciferol  400 Units Oral Daily  . clopidogrel  75 mg Oral Q breakfast  . DULoxetine  60 mg Oral Daily  . gabapentin  300 mg Oral BID  . insulin aspart  0-9 Units Subcutaneous TID WC  . insulin glargine  10 Units Subcutaneous QHS  . metoCLOPramide  5 mg Oral TID AC & HS  . metoprolol  succinate  50 mg Oral BID  . mirtazapine  15 mg Oral QHS  . mometasone-formoterol  2 puff Inhalation BID  . nortriptyline  30 mg Oral QHS  . pravastatin  20 mg Oral q1800  . sodium chloride flush  3 mL Intravenous Q12H   Continuous Infusions: . sodium chloride    . furosemide 160 mg (06/21/17 0556)     LOS: 3 days        Lekendrick Alpern Gerome Apley, MD [facility] Pager [phone]

## 2017-06-21 NOTE — Progress Notes (Signed)
PROGRESS NOTE    Susan Horton  NWG:[id number] DOB: 09-20-1970 DOA: 06/18/2017 PCP: Charlott Rakes, MD    Brief Narrative:  Patient is a 47 year old female with history of CAD post angioplasty, CKD stage IV, chronic diastolic heart failure, asthma, depression, hypertension and type 2 diabetes mellitus admitted for progressive dyspnea and chest pressure. On admission blood pressure 116/91, heart rate 106, respiratory 25, oxygen saturation 99%. Her troponin was 2.46 on 6/8, the most recent troponin was 2.34 on 6/9. Chest X-ray showed no overt pulmonary edema and trace bilateral pleural effusions. Her creatinine was 4.13 on admission and was 5.62 this morning. Nephrology recommends initiation of dialysis. Patient is s/p AVF in January. VVS indicated fistula has an easily palpable thrill and cannulation of fistula can be attempted.    Assessment & Plan:   Principal Problem:   Acute on chronic diastolic CHF (congestive heart failure) (HCC) Active Problems:   Uncontrolled type 2 diabetes mellitus with peripheral neuropathy (HCC)   Gastroparesis   Essential hypertension, benign   Coronary artery disease involving native coronary artery of native heart with angina pectoris (HCC)   CKD (chronic kidney disease) stage 4, GFR 15-29 ml/min (HCC)   Chest pain   Adjustment disorder with depressed mood   Moderate persistent asthma   Elevated troponin   Hypertensive urgency   1.  Acute on chronic diastolic heart failure decompensation.  -Chest pain likely to be acute on chronic heart failure brought on by worsening kidney disease. Patient symptoms decreasing in response to diuresis -Echocardiogram showed grade 2 diastolic dysfunction with EF of 55-60% -Increase Lasix to 160mg  q8 hours  2.  Acute cardiogenic pulmonary edema.  -Increase Lasix to 160mg  q8 hours -Continue monitoring with pulse oxometry  3.  Ischemic heart disease. -Continue antiplatelet therapy with clopidogrel and aspirin.   4.   Chronic kidney disease stage IV -Creatinine 5.62 this morning, showing steady increase from 4.13, 4.70, 5.33 -Nephrology recommends initiation of hemodialysis. Vascular recommends attempting to cannulate fistula as there is palpable thrill and adequate diameter and depth via duplex in February. Patient to start dialysis today.   5.  Type 2 diabetes mellitus.  -Continue glucose monitoring and insulin sliding scale.   6.  Hypertension. -Continue amlodipine and metoprolol, as needed hydralazine.   7. Depression. -Continue nortriptyline, mirtazapine and duloxetin.   DVT prophylaxis: heparin   Code Status:  Full code Family Communication: no family at the bedside  Disposition Plan: telemetry   Consultants:   Nephrology  Vascular and vein specialist  Procedures:  Echocardiogram: Left ventricle: The cavity size was normal. Wall thickness was increased in a pattern of mild LVH. Systolic function was normal. The estimated ejection fraction was in the range of 55% to 60%. Features are consistent with a pseudonormal left ventricular filling pattern, with concomitant abnormal relaxation and increased filling pressure (grade 2 diastolic dysfunction). Mitral valve: There was mild regurgitation. Left atrium: The atrium was mildly dilated. Pericardium, extracardiac: A trivial pericardial effusion was identified.  Antimicrobials:       Subjective: Patient reports she is no longer having chest pain and can move around more than yesterday without getting as short of breath. She states she still cannot lay flat as she feels suffocated. She reports decreased fluid in her abdomen and legs.   Objective: Vitals:   06/20/17 2104 06/20/17 2229 06/21/17 0438 06/21/17 1137  BP: 132/81 139/76 119/72   Pulse: 86 87 78   Resp:   19   Temp: 98.6  F (37 C)  98.9 F (37.2 C)   TempSrc: Oral  Oral   SpO2: 98%  98% 99%  Weight:   75.7 kg (166 lb 14.4 oz)   Height:        Intake/Output Summary (Last 24  hours) at 06/21/2017 1238 Last data filed at 06/20/2017 2235 Gross per 24 hour  Intake 410 ml  Output -  Net 410 ml   Filed Weights   06/18/17 2137 06/20/17 0510 06/21/17 0438  Weight: 74.8 kg (165 lb) 74.7 kg (164 lb 9.6 oz) 75.7 kg (166 lb 14.4 oz)    Examination:   General: deconditioned, but improving Neurology: Awake and alert, non focal  E ENT: no pallor, no icterus, oral mucosa moist Cardiovascular: No JVD present. S1-S2 present, rhythmic, no gallops, rubs. Systolic murmur. No lower extremity edema. Pulmonary: decreased breath sounds bilaterally, adequate air movement, no wheezing, rhonchi or rales. Gastrointestinal. Abdomen with no organomegaly, non tender, no rebound or guarding Skin. No rashes Musculoskeletal: no joint deformities     Data Reviewed: I have personally reviewed following labs and imaging studies  CBC: Recent Labs  Lab 06/17/17 1311 06/18/17 1443  WBC  --  9.5  HGB 11.8* 12.2  HCT  --  40.9  MCV  --  82.6  PLT  --  944   Basic Metabolic Panel: Recent Labs  Lab 06/18/17 1443 06/19/17 0214 06/20/17 1436 06/21/17 0559  NA 137 137 133* 134*  K 4.6 4.4 5.3* 5.2*  CL 109 106 107 103  CO2 21* 19* 15* 21*  GLUCOSE 197* 265* 271* 236*  BUN 60* 63* 73* 76*  CREATININE 4.13* 4.70* 5.33* 5.62*  CALCIUM 8.8* 8.4* 8.1* 8.6*   GFR: Estimated Creatinine Clearance: 12.7 mL/min (A) (by C-G formula based on SCr of 5.62 mg/dL (H)). Liver Function Tests: No results for input(s): AST, ALT, ALKPHOS, BILITOT, PROT, ALBUMIN in the last 168 hours. No results for input(s): LIPASE, AMYLASE in the last 168 hours. No results for input(s): AMMONIA in the last 168 hours. Coagulation Profile: No results for input(s): INR, PROTIME in the last 168 hours. Cardiac Enzymes: Recent Labs  Lab 06/18/17 2244 06/19/17 0214 06/19/17 0833  TROPONINI 2.46* 2.20* 2.34*   BNP (last 3 results) No results for input(s): PROBNP in the last 8760 hours. HbA1C: No results for  input(s): HGBA1C in the last 72 hours. CBG: Recent Labs  Lab 06/20/17 1119 06/20/17 1629 06/20/17 2102 06/21/17 0754 06/21/17 1124  GLUCAP 167* 215* 203* 213* 133*   Lipid Profile: No results for input(s): CHOL, HDL, LDLCALC, TRIG, CHOLHDL, LDLDIRECT in the last 72 hours. Thyroid Function Tests: No results for input(s): TSH, T4TOTAL, FREET4, T3FREE, THYROIDAB in the last 72 hours. Anemia Panel: No results for input(s): VITAMINB12, FOLATE, FERRITIN, TIBC, IRON, RETICCTPCT in the last 72 hours.    Radiology Studies: I have reviewed all of the imaging during this hospital visit personally     Scheduled Meds: . aspirin  81 mg Oral Daily  . calcitRIOL  0.25 mcg Oral Daily  . Chlorhexidine Gluconate Cloth  6 each Topical Q0600  . cholecalciferol  400 Units Oral Daily  . clopidogrel  75 mg Oral Q breakfast  . DULoxetine  60 mg Oral Daily  . gabapentin  300 mg Oral BID  . insulin aspart  0-9 Units Subcutaneous TID WC  . insulin glargine  10 Units Subcutaneous QHS  . metoCLOPramide  5 mg Oral TID AC & HS  . metoprolol succinate  50  mg Oral BID  . mirtazapine  15 mg Oral QHS  . mometasone-formoterol  2 puff Inhalation BID  . nortriptyline  30 mg Oral QHS  . pravastatin  20 mg Oral q1800  . sodium chloride flush  3 mL Intravenous Q12H   Continuous Infusions: . sodium chloride    . furosemide 160 mg (06/21/17 0556)     LOS: 3 days    Carita Pian PA-S    [facility] Pager [phone]

## 2017-06-21 NOTE — Progress Notes (Signed)
During initial attempt to cannulate AVF, difficulty with placement of venous needle and once HD initiation she had infiltration. Will discuss with VVS. RS

## 2017-06-21 NOTE — Progress Notes (Signed)
Admit: 06/18/2017 LOS: 3  1F with AoCKD5, some uremic Sx, hypervolemia  Subjective:  No new issues UOP not quantified Weights unchanged SCr up to 5.62, K 5.2, HCO3 21 She is ready to start HD, wants to avoid [facility]  06/10 0701 - 06/11 0700 In: 650 [P.O.:600; IV Piggyback:50] Out: -   Filed Weights   06/18/17 2137 06/20/17 0510 06/21/17 0438  Weight: 74.8 kg (165 lb) 74.7 kg (164 lb 9.6 oz) 75.7 kg (166 lb 14.4 oz)    Scheduled Meds: . aspirin  81 mg Oral Daily  . calcitRIOL  0.25 mcg Oral Daily  . cholecalciferol  400 Units Oral Daily  . clopidogrel  75 mg Oral Q breakfast  . DULoxetine  60 mg Oral Daily  . gabapentin  300 mg Oral BID  . insulin aspart  0-9 Units Subcutaneous TID WC  . insulin glargine  10 Units Subcutaneous QHS  . metoCLOPramide  5 mg Oral TID AC & HS  . metoprolol succinate  50 mg Oral BID  . mirtazapine  15 mg Oral QHS  . mometasone-formoterol  2 puff Inhalation BID  . nortriptyline  30 mg Oral QHS  . pravastatin  20 mg Oral q1800  . sodium chloride flush  3 mL Intravenous Q12H   Continuous Infusions: . sodium chloride    . furosemide 160 mg (06/21/17 0556)   PRN Meds:.sodium chloride, acetaminophen, albuterol, cyclobenzaprine, hydrALAZINE, ondansetron (ZOFRAN) IV, sodium chloride flush  Current Labs: reviewed    Physical Exam:  Blood pressure 119/72, pulse 78, temperature 98.9 F (37.2 C), temperature source Oral, resp. rate 19, height 5\' 5"  (1.651 m), weight 75.7 kg (166 lb 14.4 oz), SpO2 98 %. NAD RRR no rub nl s1s2 CTAB nl wob S/nt/nd LUE AVF nl augmentation/collapse, ?Deep Nonfocal, noasterixus EOMI NCAT  A 1. AoCKD5, + uremic Sx 2. Hypervolemia / dCHF exacerbation 3. N/V, hx/o diabetic gastroparesis 4. L UE AVF, appears immature, placed 01/2017 5. DM2 6. CAD 7. HTN  P 1. Plan to start HD, need help with AVF to see if should attempt cannulation right away, VVS to see 2. HD#1 today using AVF if cleared or after [facility] placement:  2h, 1L UF, 2K, Qb 200, no heparin 3. Check PTH and P with HD 4. Ched TSAt and Ferritin with HD 5. Daily weights, Daily Renal Panel, Strict I/Os, Avoid nephrotoxins (NSAIDs, judicious IV Contrast)    Pearson Grippe MD 06/21/2017, 8:15 AM  Recent Labs  Lab 06/19/17 0214 06/20/17 1436 06/21/17 0559  NA 137 133* 134*  K 4.4 5.3* 5.2*  CL 106 107 103  CO2 19* 15* 21*  GLUCOSE 265* 271* 236*  BUN 63* 73* 76*  CREATININE 4.70* 5.33* 5.62*  CALCIUM 8.4* 8.1* 8.6*   Recent Labs  Lab 06/17/17 1311 06/18/17 1443  WBC  --  9.5  HGB 11.8* 12.2  HCT  --  40.9  MCV  --  82.6  PLT  --  356

## 2017-06-21 NOTE — Plan of Care (Signed)
  Problem: Clinical Measurements: Goal: Diagnostic test results will improve Outcome: Not Progressing   

## 2017-06-22 ENCOUNTER — Ambulatory Visit: Payer: Medicaid Other | Admitting: Cardiovascular Disease

## 2017-06-22 ENCOUNTER — Encounter (HOSPITAL_COMMUNITY): Payer: Self-pay | Admitting: Vascular Surgery

## 2017-06-22 ENCOUNTER — Encounter (HOSPITAL_COMMUNITY)
Admission: EM | Disposition: A | Discharge status: Home / Self Care | Payer: Self-pay | Source: Home / Self Care | Attending: Internal Medicine

## 2017-06-22 HISTORY — PX: A/V FISTULAGRAM: CATH118298

## 2017-06-22 LAB — BASIC METABOLIC PANEL
Anion gap: 13 (ref 5–15)
BUN: 92 mg/dL — AB (ref 6–20)
CO2: 22 mmol/L (ref 22–32)
Calcium: 8.3 mg/dL — ABNORMAL LOW (ref 8.9–10.3)
Chloride: 102 mmol/L (ref 101–111)
Creatinine, Ser: 6.14 mg/dL — ABNORMAL HIGH (ref 0.44–1.00)
GFR calc Af Amer: 9 mL/min — ABNORMAL LOW (ref >60)
GFR, EST NON AFRICAN AMERICAN: 7 mL/min — AB (ref >60)
GLUCOSE: 242 mg/dL — AB (ref 65–99)
Potassium: 4.7 mmol/L (ref 3.5–5.1)
Sodium: 137 mmol/L (ref 135–145)

## 2017-06-22 LAB — GLUCOSE, CAPILLARY
Glucose-Capillary: 202 mg/dL — ABNORMAL HIGH (ref 65–99)
Glucose-Capillary: 113 mg/dL — ABNORMAL HIGH (ref 65–99)
Glucose-Capillary: 226 mg/dL — ABNORMAL HIGH (ref 65–99)
Glucose-Capillary: 187 mg/dL — ABNORMAL HIGH (ref 65–99)

## 2017-06-22 SURGERY — A/V FISTULAGRAM
Anesthesia: LOCAL

## 2017-06-22 MED ORDER — MIDAZOLAM HCL 2 MG/2ML IJ SOLN
INTRAMUSCULAR | Status: AC
  Filled 2017-06-22: qty 2

## 2017-06-22 MED ORDER — LIDOCAINE HCL (PF) 1 % IJ SOLN
INTRAMUSCULAR | Status: AC
  Filled 2017-06-22: qty 30

## 2017-06-22 MED ORDER — LIDOCAINE HCL (PF) 1 % IJ SOLN
INTRAMUSCULAR | Status: DC | PRN
  Administered 2017-06-22: 2 mL

## 2017-06-22 MED ORDER — CHLORHEXIDINE GLUCONATE CLOTH 2 % EX PADS: 6.0000 | MEDICATED_PAD | Freq: Every day | CUTANEOUS | Status: DC

## 2017-06-22 MED ORDER — MIDAZOLAM HCL 2 MG/2ML IJ SOLN
INTRAMUSCULAR | Status: DC | PRN
  Administered 2017-06-22: 1 mg via INTRAVENOUS

## 2017-06-22 MED ORDER — HEPARIN (PORCINE) IN NACL 1000-0.9 UT/500ML-% IV SOLN
INTRAVENOUS | Status: AC
  Filled 2017-06-22: qty 500

## 2017-06-22 MED ORDER — IODIXANOL 320 MG/ML IV SOLN
INTRAVENOUS | Status: DC | PRN
  Administered 2017-06-22: 30 mL via INTRAVENOUS

## 2017-06-22 MED ORDER — HEPARIN (PORCINE) IN NACL 2-0.9 UNITS/ML
INTRAMUSCULAR | Status: AC | PRN
  Administered 2017-06-22: 500 mL

## 2017-06-22 MED ORDER — FENTANYL CITRATE (PF) 100 MCG/2ML IJ SOLN
INTRAMUSCULAR | Status: DC | PRN
  Administered 2017-06-22: 50 ug via INTRAVENOUS

## 2017-06-22 MED ORDER — FENTANYL CITRATE (PF) 100 MCG/2ML IJ SOLN
INTRAMUSCULAR | Status: AC
  Filled 2017-06-22: qty 2

## 2017-06-22 SURGICAL SUPPLY — 10 items
BAG SNAP BAND KOVER 36X36 (MISCELLANEOUS) ×2 IMPLANT
COVER DOME SNAP 22 D (MISCELLANEOUS) ×2 IMPLANT
COVER PRB 48X5XTLSCP FOLD TPE (BAG) ×1 IMPLANT
COVER PROBE 5X48 (BAG) ×2
KIT MICROPUNCTURE NIT STIFF (SHEATH) ×1 IMPLANT
PROTECTION STATION PRESSURIZED (MISCELLANEOUS) ×2
STATION PROTECTION PRESSURIZED (MISCELLANEOUS) ×1 IMPLANT
STOPCOCK MORSE 400PSI 3WAY (MISCELLANEOUS) ×2 IMPLANT
TRAY PV CATH (CUSTOM PROCEDURE TRAY) ×2 IMPLANT
TUBING CIL FLEX 10 FLL-RA (TUBING) ×2 IMPLANT

## 2017-06-22 NOTE — Progress Notes (Signed)
PROGRESS NOTE    Susan Horton  UVO:[id number] DOB: 1970-03-03 DOA: 06/18/2017 PCP: Charlott Rakes, MD    Brief Narrative:  47 year old female who presented with dyspnea, orthopnea and chest discomfort. She does have the significant past medical history for coronary artery disease status post angioplasty, chronic kidney disease stage IV, chronic diastolic heart failure, asthma, depression, hypertension and type 2 diabetes mellitus. Shereported worsening dyspnea over the last several days prior to hospitalization, more severe over the last 24 hours, associated with chest pressure and orthopnea. Initial physical examination blood pressure 116/91, heart rate 106, respiratory 25, oxygen saturation 99%.Moist mucous membranes, lungs with decreased breath sounds bilaterally, bilateral rales, heart S1-S2 present and rhythmic, positive JVD, abdomen soft nontender, nondistended, lower extremities with no edema, left upper extremity AV fistula with positive bruit and thrill.Sodium 137, potassium 4.6, chloride 109, bicarb 21, glucose 197, BUN 60, creatinine 4.13, white count 9.5, hemoglobin 12.2, hematocrit 40.9, platelets 356,troponin II 0.46.Chest x-ray hypoinflated, increased interstitial markings bilaterally with fluid in the right fissure, small bibasilar pleural effusions. EKG sinus rhythm 112 bpm, normal axis, normal intervals, positive LVH per EKG criteria.  Patient was admitted to the hospital with working diagnosis of acute on chronic decompensation of diastolic heart failure,complicated by acute cardiogenic pulmonary edema.   Assessment & Plan:   Principal Problem:   Acute on chronic diastolic CHF (congestive heart failure) (HCC) Active Problems:   Uncontrolled type 2 diabetes mellitus with peripheral neuropathy (HCC)   Gastroparesis   Essential hypertension, benign   Coronary artery disease involving native coronary artery of native heart with angina pectoris (HCC)   CKD (chronic  kidney disease) stage 4, GFR 15-29 ml/min (HCC)   Chest pain   Adjustment disorder with depressed mood   Moderate persistent asthma   Elevated troponin   Hypertensive urgency  1.Acute on chronic diastolic heart failure decompensation due to progressive renal disease.Sympotms have been stable unable to get HD due to apparent access dysfunction. Continue b blocker for heart failure management.   2.Acute cardiogenic pulmonary edema.Unable to get UF due to apparent HD access dysfunction. Oxymetry monitoring, and as needed supplemental -02 per [place], oxygen saturation 98% on room air.  3.Ischemic heart disease.Medical therapy with dual antiplatelet (asa- clopidogrel), continue pravastatin. Echocardiogram with no wall motion abnormalities.    4.Chronic kidney disease stage IV now progressed to end stage renal disease.Plan for fistulogram today, will need  HD and ultrafiltration on this amdission. Follow with nephrology and vascular surgery recommendations, volume status has been stable, K at 4,7 and serum bicarbonate at 22.      5.Type 2 diabetes mellitus.Gucose cover and monitoringwithinsulin sliding scale, plus 10 units of basal insulin, capillary glucose 85, 205, 202, 113, 226, will do further adjustments once patient is on renal replacement therapy to avoid hypoglycemia.   6.Hypertension. Blood pressure control with metoprolol.   7. Depression.On nortriptyline, mirtazapine and duloxetin. No confusion or agitation.   DVT prophylaxis:heparin Code Status:full Family Communication:I spoke with patient's family at the bedside and all questions were addressed.  Disposition Plan:telemetry   Consultants:    Procedures:    Antimicrobials:   Subjective: Patient reports improvement in her dyspnea, no chest pain, no nausea or vomiting. Unable to use av fistula yesterday for HD. Has been out of bed as tolerated.   Objective: Vitals:   06/21/17  2139 06/21/17 2145 06/22/17 0622 06/22/17 0747  BP:  125/65 (!) 111/57   Pulse: 86 83 77 81  Resp:  (!) 22 19 16  Temp:  98.2 F (36.8 C) 98 F (36.7 C)   TempSrc:  Oral Oral   SpO2:  100% 98% 98%  Weight:   74.7 kg (164 lb 11.2 oz)   Height:        Intake/Output Summary (Last 24 hours) at 06/22/2017 1131 Last data filed at 06/22/2017 0604 Gross per 24 hour  Intake 1008.5 ml  Output 525 ml  Net 483.5 ml   Filed Weights   06/20/17 0510 06/21/17 0438 06/22/17 0622  Weight: 74.7 kg (164 lb 9.6 oz) 75.7 kg (166 lb 14.4 oz) 74.7 kg (164 lb 11.2 oz)    Examination:   General: Not in pain or dyspnea  Neurology: Awake and alert, non focal  E ENT: no pallor, no icterus, oral mucosa moist Cardiovascular: No JVD. S1-S2 present, rhythmic, no gallops, rubs, or murmurs. Trace lower extremity edema. Pulmonary: decreased breath sounds bilaterally at bases, adequate air movement, no wheezing, rhonchi or rales. Gastrointestinal. Abdomen with no organomegaly, non tender, no rebound or guarding Skin. No rashes Musculoskeletal: no joint deformities     Data Reviewed: I have personally reviewed following labs and imaging studies  CBC: Recent Labs  Lab 06/17/17 1311 06/18/17 1443  WBC  --  9.5  HGB 11.8* 12.2  HCT  --  40.9  MCV  --  82.6  PLT  --  875   Basic Metabolic Panel: Recent Labs  Lab 06/18/17 1443 06/19/17 0214 06/20/17 1436 06/21/17 0559 06/21/17 1939 06/22/17 0354  NA 137 137 133* 134*  --  137  K 4.6 4.4 5.3* 5.2*  --  4.7  CL 109 106 107 103  --  102  CO2 21* 19* 15* 21*  --  22  GLUCOSE 197* 265* 271* 236*  --  242*  BUN 60* 63* 73* 76*  --  92*  CREATININE 4.13* 4.70* 5.33* 5.62*  --  6.14*  CALCIUM 8.8* 8.4* 8.1* 8.6*  --  8.3*  PHOS  --   --   --   --  5.6*  --    GFR: Estimated Creatinine Clearance: 11.6 mL/min (A) (by C-G formula based on SCr of 6.14 mg/dL (H)). Liver Function Tests: No results for input(s): AST, ALT, ALKPHOS, BILITOT, PROT,  ALBUMIN in the last 168 hours. No results for input(s): LIPASE, AMYLASE in the last 168 hours. No results for input(s): AMMONIA in the last 168 hours. Coagulation Profile: No results for input(s): INR, PROTIME in the last 168 hours. Cardiac Enzymes: Recent Labs  Lab 06/18/17 2244 06/19/17 0214 06/19/17 0833  TROPONINI 2.46* 2.20* 2.34*   BNP (last 3 results) No results for input(s): PROBNP in the last 8760 hours. HbA1C: No results for input(s): HGBA1C in the last 72 hours. CBG: Recent Labs  Lab 06/21/17 0754 06/21/17 1124 06/21/17 1623 06/21/17 2215 06/22/17 0731  GLUCAP 213* 133* 85 205* 202*   Lipid Profile: No results for input(s): CHOL, HDL, LDLCALC, TRIG, CHOLHDL, LDLDIRECT in the last 72 hours. Thyroid Function Tests: No results for input(s): TSH, T4TOTAL, FREET4, T3FREE, THYROIDAB in the last 72 hours. Anemia Panel: Recent Labs    06/21/17 1939  FERRITIN 1,436*  TIBC 167*  IRON 43      Radiology Studies: I have reviewed all of the imaging during this hospital visit personally     Scheduled Meds: . aspirin  81 mg Oral Daily  . calcitRIOL  0.25 mcg Oral Daily  . Chlorhexidine Gluconate Cloth  6 each Topical Q0600  . cholecalciferol  400 Units Oral Daily  . clopidogrel  75 mg Oral Q breakfast  . DULoxetine  60 mg Oral Daily  . gabapentin  300 mg Oral BID  . insulin aspart  0-9 Units Subcutaneous TID WC  . insulin glargine  10 Units Subcutaneous QHS  . metoCLOPramide  5 mg Oral TID AC & HS  . metoprolol succinate  50 mg Oral BID  . mirtazapine  15 mg Oral QHS  . mometasone-formoterol  2 puff Inhalation BID  . nortriptyline  30 mg Oral QHS  . pravastatin  20 mg Oral q1800  . sodium chloride flush  3 mL Intravenous Q12H   Continuous Infusions: . sodium chloride    . furosemide Stopped (06/22/17 0708)     LOS: 4 days        Mauricio Gerome Apley, MD [facility] Pager [phone]

## 2017-06-22 NOTE — Op Note (Signed)
    Patient name: Susan Horton MRN: [id number] DOB: 1970-08-17 Sex: female  06/22/2017 Pre-operative Diagnosis: End-stage renal disease Post-operative diagnosis:  Same Surgeon:  Susan Horton C. [name], MD Procedure Performed: 1.  Ultrasound-guided cannulation left arm AV fistula 2.  Shuntogram left upper extremity 3.  Moderate sedation with fentanyl and Versed for 11 minutes   Indications: 47 year old female has a fistula in her left upper extremity since earlier this year.  She is now in need of it and on attempt to use yesterday there was an infiltration of event.  She is now indicated for fistulogram possible intervention.  Findings: Fistula appears patent throughout its course with very small side branches.  By exam it is only 1.5 cm deep and should be able to be cannulated.  If he cannot would need superficialization as the only other option.   Procedure:  The patient was identified in the holding area and taken to room 8.  The patient was then placed supine on the table and prepped and draped in the usual sterile fashion.  A time out was called.  Ultrasound was used to evaluate the left arm AV fistula this was noted to be patent and compressible and an image was saved to the permanent record.  Was cannulated under direct visualization with micropuncture needle and wire and sheath were placed.  We then performed left upper extremity shuntogram.  Pressure was held above the cannulation site retrograde demonstrated patent arterial venous anastomosis.  I then used ultrasound to identify the fistula in the upper arm which appeared to be 1.5 cm deep.  This should be able to be cannulated but if it cannot perhaps super fistulization with sidebranch ligation would be the answer.  She tolerated this procedure well without immediate complication.  At the end we pulled the sheath and pressure was held.  Contrast 30 cc.   Charletha Dalpe C. [name], MD [facility] Office:  [phone] Pager: [phone]

## 2017-06-22 NOTE — Care Management Note (Addendum)
Case Management Note  Patient Details  Name: FERNANDE TREIBER MRN: [id number] Date of Birth: 1970/04/21  Subjective/Objective:   ESRD, new HD, Left UE AV flistula failed cannulation 6/11; Fistulogram 6/12 w/o intervention should attempt to recannulate, reattempt cannulation on 6/13 and then HD, CLIP process pending   Action/Plan: Spoke to pt and she will be able to drive herself to dialysis treatment. Chart reviewed. NCM will continue to follow for dc needs.   Expected Discharge Date:                  Expected Discharge Plan:  Home/Self Care  In-House Referral:  NA  Discharge planning Services  CM Consult  Post Acute Care Choice:  NA Choice offered to:  NA  DME Arranged:  N/A DME Agency:  NA  HH Arranged:  NA HH Agency:  NA  Status of Service:  In process, will continue to follow  If discussed at Long Length of Stay Meetings, dates discussed:    Additional Comments:  Erenest Rasher, RN 06/22/2017, 5:57 PM

## 2017-06-22 NOTE — Progress Notes (Signed)
Inpatient Diabetes Program Recommendations  AACE/ADA: New Consensus Statement on Inpatient Glycemic Control (2015)  Target Ranges:  Prepandial:   less than 140 mg/dL      Peak postprandial:   less than 180 mg/dL (1-2 hours)      Critically ill patients:  140 - 180 mg/dL   Lab Results  Component Value Date   GLUCAP 113 (H) 06/22/2017   HGBA1C 8.5 05/23/2017    Review of Glycemic ControlResults for Susan Horton, Susan Horton (MRN [id number]) as of 06/22/2017 12:07  Ref. Range 06/21/2017 07:54 06/21/2017 11:24 06/21/2017 16:23 06/21/2017 22:15 06/22/2017 07:31 06/22/2017 11:22  Glucose-Capillary Latest Ref Range: 65 - 99 mg/dL 213 (H) 133 (H) 85 205 (H) 202 (H) 113 (H)  Diabetes history: Type 2 DM Outpatient Diabetes medications: Toujeo 60 units BID, Regular 25-35 units TID,  Current orders for Inpatient glycemic control: Novolog 0-9 units TID, Lantus 10 units QHS Inpatient Diabetes Program Recommendations:   Consider increasing Lantus to 15 units q HS.  Interestingly, patient's blood sugars have not been too bad with only minimal amounts of insulin.    Thanks,  Adah Perl, RN, BC-ADM Inpatient Diabetes Coordinator Pager [phone] (8a-5p)

## 2017-06-22 NOTE — Progress Notes (Signed)
Admit: 06/18/2017 LOS: 4  24F with AoCKD5, some uremic Sx, hypervolemia  Subjective:  Infiltrated yesterday; Fgram this AM w/o intervention; rec reattempt at cannulation No further issues today  06/11 0701 - 06/12 0700 In: 1008.5 [P.O.:120; I.V.:3; IV Piggyback:885.5] Out: 525 [Urine:525]  Filed Weights   06/20/17 0510 06/21/17 0438 06/22/17 0622  Weight: 74.7 kg (164 lb 9.6 oz) 75.7 kg (166 lb 14.4 oz) 74.7 kg (164 lb 11.2 oz)    Scheduled Meds: . aspirin  81 mg Oral Daily  . calcitRIOL  0.25 mcg Oral Daily  . Chlorhexidine Gluconate Cloth  6 each Topical Q0600  . cholecalciferol  400 Units Oral Daily  . clopidogrel  75 mg Oral Q breakfast  . DULoxetine  60 mg Oral Daily  . gabapentin  300 mg Oral BID  . insulin aspart  0-9 Units Subcutaneous TID WC  . insulin glargine  10 Units Subcutaneous QHS  . metoCLOPramide  5 mg Oral TID AC & HS  . metoprolol succinate  50 mg Oral BID  . mirtazapine  15 mg Oral QHS  . mometasone-formoterol  2 puff Inhalation BID  . nortriptyline  30 mg Oral QHS  . pravastatin  20 mg Oral q1800  . sodium chloride flush  3 mL Intravenous Q12H   Continuous Infusions: . sodium chloride    . furosemide Stopped (06/22/17 0708)   PRN Meds:.sodium chloride, acetaminophen, albuterol, cyclobenzaprine, hydrALAZINE, ondansetron (ZOFRAN) IV, sodium chloride flush  Current Labs: reviewed    Physical Exam:  Blood pressure (!) 112/92, pulse 82, temperature 98 F (36.7 C), temperature source Oral, resp. rate 14, height 5\' 5"  (1.651 m), weight 74.7 kg (164 lb 11.2 oz), SpO2 100 %. NAD RRR no rub nl s1s2 CTAB nl wob S/nt/nd LUE AVF nl augmentation/collapse, ?Deep Nonfocal, noasterixus EOMI NCAT  A 1. AoCKD5, + uremic Sx 2. Hypervolemia / dCHF exacerbation 3. N/V, hx/o diabetic gastroparesis 4. L UE AVF, failed cannulation 6/11; FGram 6/12 w/o intervention should attempt to recannulate 5. DM2 6. CAD 7. HTN 8. Anemia, TSAT 26% and ferritin  1436 9. CKD-BMD P 5.6, monitor, PTH pending  P 1. Reattempt cannulation and HD tomorrow: 2h, 1L UF, 2K, Qb 200, no heparin 2. CLIP in process 3. Daily weights, Daily Renal Panel, Strict I/Os, Avoid nephrotoxins (NSAIDs, judicious IV Contrast)    Pearson Grippe MD 06/22/2017, 2:33 PM  Recent Labs  Lab 06/20/17 1436 06/21/17 0559 06/21/17 1939 06/22/17 0354  NA 133* 134*  --  137  K 5.3* 5.2*  --  4.7  CL 107 103  --  102  CO2 15* 21*  --  22  GLUCOSE 271* 236*  --  242*  BUN 73* 76*  --  92*  CREATININE 5.33* 5.62*  --  6.14*  CALCIUM 8.1* 8.6*  --  8.3*  PHOS  --   --  5.6*  --    Recent Labs  Lab 06/17/17 1311 06/18/17 1443  WBC  --  9.5  HGB 11.8* 12.2  HCT  --  40.9  MCV  --  82.6  PLT  --  356

## 2017-06-22 NOTE — Progress Notes (Signed)
VASCULAR SURGERY:  I reviewed the fistulogram performed by today by Dr. Servando Snare.  This really does not show any significant problems with the fistula.  I would agree with trying to cannulate this a plan.  If she continues to have problems Dr. [name] recommended possibly superficializing the fistula.  I will be away for the remainder of the week.  If there is any problems please contact our office [phone])  Deitra Mayo, MD, [place] [phone] Office: [phone]

## 2017-06-22 NOTE — Progress Notes (Signed)
  Progress Note    06/22/2017 1:39 PM Day of Surgery  Subjective:  No acute issues  Vitals:   06/22/17 0747 06/22/17 1332  BP:    Pulse: 81   Resp: 16   Temp:    SpO2: 98% (!) 41%    Physical Exam: nad Non labored respirations Left arm with fistula  CBC    Component Value Date/Time   WBC 9.5 06/18/2017 1443   RBC 4.95 06/18/2017 1443   HGB 12.2 06/18/2017 1443   HGB 9.2 (L) 07/26/2016 0000   HCT 40.9 06/18/2017 1443   HCT 30.7 (L) 07/26/2016 0000   PLT 356 06/18/2017 1443   PLT 474 (H) 07/26/2016 0000   MCV 82.6 06/18/2017 1443   MCV 82 07/26/2016 0000   MCH 24.6 (L) 06/18/2017 1443   MCHC 29.8 (L) 06/18/2017 1443   RDW 14.4 06/18/2017 1443   RDW 13.6 07/26/2016 0000   LYMPHSABS 2.9 07/26/2016 0000   MONOABS 0.4 11/20/2015 1843   EOSABS 0.1 07/26/2016 0000   BASOSABS 0.0 07/26/2016 0000    BMET    Component Value Date/Time   NA 137 06/22/2017 0354   NA 132 (L) 07/26/2016 0000   K 4.7 06/22/2017 0354   CL 102 06/22/2017 0354   CO2 22 06/22/2017 0354   GLUCOSE 242 (H) 06/22/2017 0354   BUN 92 (H) 06/22/2017 0354   BUN 39 (H) 07/26/2016 0000   CREATININE 6.14 (H) 06/22/2017 0354   CALCIUM 8.3 (L) 06/22/2017 0354   GFRNONAA 7 (L) 06/22/2017 0354   GFRAA 9 (L) 06/22/2017 0354    INR    Component Value Date/Time   INR 0.91 02/01/2017 0649     Intake/Output Summary (Last 24 hours) at 06/22/2017 1339 Last data filed at 06/22/2017 0604 Gross per 24 hour  Intake 1008.5 ml  Output 525 ml  Net 483.5 ml     Assessment:  47 y.o. female is here with prolonged bleeding avf Plan: Left arm fistulogram today   Susan Horton C. [name], MD [facility] Office: [phone] Pager: [phone]  06/22/2017 1:39 PM

## 2017-06-23 ENCOUNTER — Other Ambulatory Visit (HOSPITAL_COMMUNITY): Payer: Self-pay

## 2017-06-23 LAB — BASIC METABOLIC PANEL
Anion gap: 14 (ref 5–15)
BUN: 104 mg/dL — AB (ref 6–20)
CHLORIDE: 101 mmol/L (ref 101–111)
CO2: 22 mmol/L (ref 22–32)
CREATININE: 6.76 mg/dL — AB (ref 0.44–1.00)
Calcium: 7.9 mg/dL — ABNORMAL LOW (ref 8.9–10.3)
GFR calc Af Amer: 8 mL/min — ABNORMAL LOW (ref >60)
GFR, EST NON AFRICAN AMERICAN: 7 mL/min — AB (ref >60)
Glucose, Bld: 228 mg/dL — ABNORMAL HIGH (ref 65–99)
POTASSIUM: 5.6 mmol/L — AB (ref 3.5–5.1)
SODIUM: 137 mmol/L (ref 135–145)

## 2017-06-23 LAB — HEPATITIS B CORE ANTIBODY, TOTAL: Hep B Core Total Ab: NEGATIVE

## 2017-06-23 LAB — HEPATITIS B SURFACE ANTIBODY,QUALITATIVE: Hep B S Ab: NONREACTIVE

## 2017-06-23 LAB — HEPATITIS B SURFACE ANTIGEN: Hepatitis B Surface Ag: NEGATIVE

## 2017-06-23 LAB — PTH, INTACT AND CALCIUM
Calcium, Total (PTH): 8.4 mg/dL — ABNORMAL LOW (ref 8.7–10.2)
PTH: 184 pg/mL — ABNORMAL HIGH (ref 15–65)

## 2017-06-23 LAB — GLUCOSE, CAPILLARY
GLUCOSE-CAPILLARY: 213 mg/dL — AB (ref 65–99)
GLUCOSE-CAPILLARY: 126 mg/dL — AB (ref 65–99)

## 2017-06-23 MED ORDER — LIDOCAINE HCL (PF) 1 % IJ SOLN: 5.0000 mL | INTRAMUSCULAR | Status: DC | PRN

## 2017-06-23 MED ORDER — DARBEPOETIN ALFA 100 MCG/0.5ML IJ SOSY
100.0000 ug | PREFILLED_SYRINGE | INTRAMUSCULAR | Status: AC
  Administered 2017-06-23: 100 ug via INTRAVENOUS

## 2017-06-23 MED ORDER — INSULIN REGULAR HUMAN 100 UNIT/ML IJ SOLN: INTRAMUSCULAR | 5 refills | Status: DC

## 2017-06-23 MED ORDER — LIDOCAINE-PRILOCAINE 2.5-2.5 % EX CREA: 1.0000 "application " | TOPICAL_CREAM | CUTANEOUS | Status: DC | PRN

## 2017-06-23 MED ORDER — PENTAFLUOROPROP-TETRAFLUOROETH EX AERO: 1.0000 "application " | INHALATION_SPRAY | CUTANEOUS | Status: DC | PRN

## 2017-06-23 MED ORDER — RENA-VITE PO TABS
1.0000 | ORAL_TABLET | Freq: Every day | ORAL | Status: DC
  Filled 2017-06-23: qty 1

## 2017-06-23 MED ORDER — GABAPENTIN 300 MG PO CAPS: 300.0000 mg | ORAL_CAPSULE | Freq: Two times a day (BID) | ORAL | Status: DC

## 2017-06-23 MED ORDER — DARBEPOETIN ALFA 100 MCG/0.5ML IJ SOSY
PREFILLED_SYRINGE | INTRAMUSCULAR | Status: AC
  Administered 2017-06-23: 100 ug via INTRAVENOUS
  Filled 2017-06-23: qty 0.5

## 2017-06-23 MED ORDER — INSULIN GLARGINE 300 UNIT/ML ~~LOC~~ SOPN
15.0000 [IU] | PEN_INJECTOR | Freq: Two times a day (BID) | SUBCUTANEOUS | 0 refills | Status: DC
Start: 2017-06-23 — End: 2017-09-22

## 2017-06-23 MED ORDER — SODIUM CHLORIDE 0.9 % IV SOLN: 100.0000 mL | INTRAVENOUS | Status: DC | PRN

## 2017-06-23 MED ORDER — RENA-VITE PO TABS
1.0000 | ORAL_TABLET | Freq: Every day | ORAL | 0 refills | Status: DC
Start: 2017-06-23 — End: 2018-12-12

## 2017-06-23 NOTE — Discharge Summary (Signed)
Physician Discharge Summary  Susan Horton CHE:[id number] DOB: May 15, 1970 DOA: 06/18/2017  PCP: Charlott Rakes, MD  Admit date: 06/18/2017 Discharge date: 06/23/2017  Time spent: 45 minutes  Recommendations for Outpatient Follow-up:  Patient will be discharged to home.  Patient will need to follow up with primary care provider within one week of discharge.  Continue hemodialysis as scheduled.  Follow-up with nephrology as well as vascular surgery.  Patient should continue medications as prescribed.  Patient should follow a heart healthy/carb modified diet.   Discharge Diagnoses:  Acute on chronic diastolic heart failure with progressive renal disease/Acute cardiogenic pulmonary edema/ischemic heart disease Chronic kidney disease, stage IV now progressed to end-stage renal disease Diabetes mellitus, type II Essential hypertension Depression  Discharge Condition: Stable  Diet recommendation: heart healthy/carb modified  Filed Weights   06/23/17 0418 06/23/17 0830 06/23/17 1130  Weight: 75 kg (165 lb 6.4 oz) 74.6 kg (164 lb 7.4 oz) 73.4 kg (161 lb 13.1 oz)    History of present illness:  On 06/18/2017 by Dr. Chrystine Oiler is a 47 y.o. female with medical history significant for coronary artery disease with stent, chronic kidney disease stage IV, chronic diastolic CHF, asthma, depression, hypertension, and insulin-dependent diabetes mellitus, now presenting to the emergency department for evaluation of shortness of breath, chest discomfort, and orthopnea.  Patient reports that she has been experiencing some mild chest discomfort and shortness of breath for several months, but this has worsened significantly in recent days, particularly over the past 24 hours.  She describes the chest discomfort as a pressure sensation, constant, localized to the central chest, and without alleviating or exacerbating factors identified.  She denies fevers, chills, or productive cough.  Reports that  her dyspnea has worsened significantly over the past day and she was unable to lie flat last night.  She denies swelling or tenderness in either lower extremity.  Hospital Course:  Acute on chronic diastolic heart failure with progressive renal disease/Acute cardiogenic pulmonary edema/ischemic heart disease -Symptoms greatly improved after patient was able to receive hemodialysis -Weaned off of supplemental oxygen, currently maintaining saturations in the high 90s on room air -Continue to monitor and treat volume status with hemodialysis -Echocardiogram showed an EF of 55 to 53%, grade 2 diastolic dysfunction.  No regional wall motion abnormalities -Continue medical therapy with aspirin, Plavix, statin  Chronic kidney disease, stage IV now progressed to end-stage renal disease -Nephrology consulted and appreciated, hemodialysis initiated -Neurosurgery consulted and appreciated for access  -Patient is to dialyze on Tuesday, Thursday, Saturday, starting on outpatient on 06/25/2017  Diabetes mellitus, type II -Continue Toujeo 15 units twice daily at discharge along with close follow-up with her primary care physician  Essential hypertension -Continue metoprolol  Depression -Continue nortriptyline, mirtazapine, duloxetine  Procedures: Echocardiogram Ultrasound-guided cannulation left arm AV fistula, shuntogram left upper extremity  Consultations: Nephrology Vascular surgery  Discharge Exam: Vitals:   06/23/17 0830 06/23/17 1130  BP: 134/74 121/77  Pulse: 85 84  Resp: 18 20  Temp: 98.1 F (36.7 C) 98.2 F (36.8 C)  SpO2: 94% 95%     General: Well developed, well nourished, NAD, appears stated age  HEENT: NCAT, mucous membranes moist.  Neck: Supple  Cardiovascular: S1 S2 auscultated, no rubs, murmurs or gallops. Regular rate and rhythm.  Respiratory: Clear to auscultation bilaterally with equal chest rise  Abdomen: Soft, nontender, nondistended, + bowel  sounds  Extremities: warm dry without cyanosis clubbing or edema  Neuro: AAOx3, nonfocal  Psych: Normal affect  and demeanor with intact judgement and insight  Discharge Instructions Discharge Instructions    Discharge instructions   Complete by:  As directed    Patient will be discharged to home.  Patient will need to follow up with primary care provider within one week of discharge.  Continue hemodialysis as scheduled.  Follow-up with nephrology as well as vascular surgery.  Patient should continue medications as prescribed.  Patient should follow a heart healthy/carb modified diet.     Allergies as of 06/23/2017      Reactions   Penicillins Anaphylaxis, Itching, Swelling, Rash, Other (See Comments)   Swelling of throat & whole mouth  Has patient had a PCN reaction causing immediate rash, facial/tongue/throat swelling, SOB or lightheadedness with hypotension: Yes Has patient had a PCN reaction causing severe rash involving mucus membranes or skin necrosis: No Has patient had a PCN reaction that required hospitalization: No Has patient had a PCN reaction occurring within the last 10 years: No If all of the above answers are "NO", then may proceed with Cephalosporin use.   Lisinopril Cough      Medication List    STOP taking these medications   amLODipine 10 MG tablet Commonly known as:  NORVASC   calcitRIOL 0.25 MCG capsule Commonly known as:  ROCALTROL   furosemide 80 MG tablet Commonly known as:  LASIX   lidocaine 5 % ointment Commonly known as:  XYLOCAINE   losartan 100 MG tablet Commonly known as:  COZAAR   RAYALDEE 30 MCG Cpcr Generic drug:  Calcifediol ER     TAKE these medications   albuterol 108 (90 Base) MCG/ACT inhaler Commonly known as:  PROVENTIL HFA;VENTOLIN HFA Inhale 1-2 puffs into the lungs every 6 (six) hours as needed for wheezing or shortness of breath.   aspirin 81 MG chewable tablet Chew 1 tablet (81 mg total) by mouth daily.   clopidogrel  75 MG tablet Commonly known as:  PLAVIX Take 1 tablet (75 mg total) by mouth daily with breakfast.   cyclobenzaprine 5 MG tablet Commonly known as:  FLEXERIL Take 1 tablet (5 mg total) by mouth at bedtime as needed for muscle spasms.   DULoxetine 60 MG capsule Commonly known as:  CYMBALTA Take 1 capsule (60 mg total) by mouth daily.   Fluticasone-Salmeterol 250-50 MCG/DOSE Aepb Commonly known as:  ADVAIR Inhale 1 puff into the lungs 2 (two) times daily.   gabapentin 300 MG capsule Commonly known as:  NEURONTIN Take 1 capsule (300 mg total) by mouth 2 (two) times daily. What changed:  See the new instructions.   Insulin Glargine 300 UNIT/ML Sopn Commonly known as:  TOUJEO SOLOSTAR Inject 15 Units into the skin 2 (two) times daily. What changed:  how much to take   insulin regular 100 units/mL injection Commonly known as:  HUMULIN R Use with meals. CBG 121-150: 2 u; CBG 151-200: 3 u; CBG 201-250: 5 u; CBG 251-300: 8 u; CBG 301-350: 11; CBG 351-400: 15 u What changed:  additional instructions   Iron 325 (65 Fe) MG Tabs Take 325 mg by mouth daily.   lovastatin 20 MG tablet Commonly known as:  MEVACOR Take 20 mg by mouth at bedtime.   metoCLOPramide 5 MG tablet Commonly known as:  REGLAN TAKE 1 TABLET BY MOUTH 4 TIMES DAILY BEFORE MEALS AND AT BEDTIME.   metoprolol succinate 50 MG 24 hr tablet Commonly known as:  TOPROL-XL Take 50 mg by mouth 2 (two) times daily.   mirtazapine 15 MG  disintegrating tablet Commonly known as:  REMERON SOL-TAB TAKE 1 TABLET BY MOUTH AT BEDTIME.   montelukast 10 MG tablet Commonly known as:  SINGULAIR Take 10 mg by mouth daily as needed (allergies).   multivitamin Tabs tablet Take 1 tablet by mouth at bedtime.   nitroGLYCERIN 0.4 MG SL tablet Commonly known as:  NITROSTAT Place 1 tablet (0.4 mg total) under the tongue every 5 (five) minutes as needed. What changed:  reasons to take this   nortriptyline 10 MG capsule Commonly known  as:  PAMELOR Take 3 tablet at bedtime. What changed:    how much to take  how to take this  when to take this  additional instructions   ondansetron 4 MG tablet Commonly known as:  ZOFRAN Take 1 tablet (4 mg total) by mouth daily as needed for nausea or vomiting.   pantoprazole 40 MG tablet Commonly known as:  PROTONIX TAKE 1 TABLET BY MOUTH DAILY.   senna-docusate 8.6-50 MG tablet Commonly known as:  Senokot-S Take 1 tablet by mouth 2 (two) times daily. What changed:    when to take this  reasons to take this   trimethoprim-polymyxin b ophthalmic solution Commonly known as:  POLYTRIM INSTILL ONE DROP INTO BOTH EYES 4 TIMES A DAY FOR 2 DAYS AFTER EACH MONTHLY EYE INJECTION   VITAMIN D-3 PO Take 1 capsule by mouth daily.      Allergies  Allergen Reactions  . Penicillins Anaphylaxis, Itching, Swelling, Rash and Other (See Comments)    Swelling of throat & whole mouth  Has patient had a PCN reaction causing immediate rash, facial/tongue/throat swelling, SOB or lightheadedness with hypotension: Yes Has patient had a PCN reaction causing severe rash involving mucus membranes or skin necrosis: No Has patient had a PCN reaction that required hospitalization: No Has patient had a PCN reaction occurring within the last 10 years: No If all of the above answers are "NO", then may proceed with Cephalosporin use.  [name] Lisinopril Cough   Follow-up Information    Charlott Rakes, MD. Schedule an appointment as soon as possible for a visit in 1 week(s).   Specialty:  Family Medicine Why:  Hospital follow-up, discuss diabetes control Contact information: [place] [address] [phone]        [name], MD. Schedule an appointment as soon as possible for a visit in 1 week(s).   Specialties:  Vascular Surgery, Cardiology Why:  Hospital follow up Contact information: [place] [address] [phone]             The results of significant diagnostics from this hospitalization (including imaging, microbiology, ancillary and laboratory) are listed below for reference.    Significant Diagnostic Studies: Dg Chest 2 View  Result Date: 06/18/2017 CLINICAL DATA:  Dyspnea EXAM: CHEST - 2 VIEW COMPARISON:  03/16/2017 chest radiograph. FINDINGS: Slightly low lung volumes. Stable cardiomediastinal silhouette with top-normal heart size. No pneumothorax. Trace bilateral pleural effusions. No overt pulmonary edema. No acute consolidative airspace disease. Cholecystectomy clips are seen in the right upper quadrant of the abdomen. IMPRESSION: Slightly low lung volumes. Top-normal heart size. No overt pulmonary edema. Trace bilateral pleural effusions. Electronically Signed   By: Ilona Sorrel M.D.   On: 06/18/2017 15:16    Microbiology: No results found for this or any previous visit (from the past 240 hour(s)).   Labs: Basic Metabolic Panel: Recent Labs  Lab 06/19/17 0214 06/20/17 1436 06/21/17 0559 06/21/17 1939 06/22/17 0354 06/23/17 2956  NA 137 133* 134*  --  137 137  K 4.4 5.3* 5.2*  --  4.7 5.6*  CL 106 107 103  --  102 101  CO2 19* 15* 21*  --  22 22  GLUCOSE 265* 271* 236*  --  242* 228*  BUN 63* 73* 76*  --  92* 104*  CREATININE 4.70* 5.33* 5.62*  --  6.14* 6.76*  CALCIUM 8.4* 8.1* 8.6* 8.4* 8.3* 7.9*  PHOS  --   --   --  5.6*  --   --    Liver Function Tests: No results for input(s): AST, ALT, ALKPHOS, BILITOT, PROT, ALBUMIN in the last 168 hours. No results for input(s): LIPASE, AMYLASE in the last 168 hours. No results for input(s): AMMONIA in the last 168 hours. CBC: Recent Labs  Lab 06/17/17 1311 06/18/17 1443  WBC  --  9.5  HGB 11.8* 12.2  HCT  --  40.9  MCV  --  82.6  PLT  --  356   Cardiac Enzymes: Recent Labs  Lab 06/18/17 2244 06/19/17 0214 06/19/17 0833  TROPONINI 2.46* 2.20* 2.34*   BNP: BNP (last 3 results) Recent Labs    07/06/16 0058  BNP 29.5     ProBNP (last 3 results) No results for input(s): PROBNP in the last 8760 hours.  CBG: Recent Labs  Lab 06/22/17 1122 06/22/17 1645 06/22/17 2118 06/23/17 0741 06/23/17 1213  GLUCAP 113* 226* 187* 213* 126*       Signed:  Gissella Niblack  [facility] 06/23/2017, 1:49 PM

## 2017-06-23 NOTE — Progress Notes (Signed)
CSW met with patient briefly at bedside to assess transportation needs now that patient will have outpatient dialysis. Patient reported she has family members who can take her to treatment. Patient indicated she would apply for SCAT if family no longer to assist at some point. CSW provided patient with SCAT application and instructions for completing application. CSW signing off as patient discharging home today and no additional needs identified.  Estanislado Emms, [place]

## 2017-06-23 NOTE — Progress Notes (Signed)
Accepted at [facility] .1st treatment Saturday June 25, 2017 at 12:40pm .Schedule and chairtime  Tuesday,Thursday,Saturday at 12:40pm 2 nd shift

## 2017-06-23 NOTE — Progress Notes (Signed)
Discharge instruction was given to pt and family.  Idolina Primer, RN

## 2017-06-23 NOTE — Discharge Instructions (Signed)
End-Stage Kidney Disease °End-stage kidney disease occurs when the kidneys are so damaged that they cannot do their job. The kidneys are two organs that do many important jobs in the body, which include: °· Removing wastes and extra fluids from the blood. °· Making hormones that maintain the amount of fluid in your tissues and blood vessels. °· Maintaining the right amount of fluids and chemicals in the body. ° °When the kidneys are damaged and cannot do their job, life-threatening problems occur. Without the help of the kidneys, toxins build up in the blood. In end-stage kidney disease, the kidneys cannot get better. °What are the causes? °End-stage kidney disease usually occurs when a long-lasting (chronic) kidney disease gets worse. It may also occur after the kidneys are suddenly damaged (acute kidney injury). °What increases the risk? °This condition is more likely to develop in people who are: °· Older than age 60. °· Female. °· Of African-American descent. °· Current smokers or former smokers. °· Obese. ° °You may also have an increased risk for end-stage kidney disease if you: °· Have a family history of chronic kidney disease (CKD). °· Have had kidney disease for many years. °· Have other longstanding medical conditions that affect the kidneys, such as: °? Cardiovascular disease including high blood pressure. °? Diabetes. °? Certain diseases that affect the immune system. ° °What are the signs or symptoms? °· Swelling (edema) of the face, legs, ankles, or feet. °· Numbness, tingling, or loss of feeling (sensation) in your hands or feet. °· Tiredness (lethargy). °· Nausea or vomiting. °· Confusion, trouble concentrating, or loss of consciousness. °· Chest pain. °· Shortness of breath. °· Little to no urine production. °· Muscle twitches and cramps, especially in the legs. °· Constant itchiness. °· Loss of appetite. °· Pale skin and tissue lining your eyelids (conjunctiva). °· Headaches. °· Abnormally dark or  light skin. °· Decrease in muscle size (muscle wasting). °· Easy bruising. °· Frequent hiccups. °· Stopping of menstruation in women. °· Seizures. °How is this diagnosed? °Your health care provider will measure your blood pressure and do some tests. These may include: °· Urine tests. °· Blood tests. °· Imaging tests. °· A test in which a sample of tissue is removed from the kidneys to be looked at under a microscope (kidney biopsy). ° °How is this treated? °There are two treatments for end-stage kidney disease: °· A procedure that removes toxic wastes from the body (dialysis). Depending on the type of dialysis you choose, it may be performed more than one time a day (peritoneal dialysis) or several times a week (hemodialysis). °· Surgery to receive a new kidney (kidney transplant). ° °In addition to having dialysis or a kidney transplant, you may need to take medicines: °· To control high blood pressure (hypertension). °· To control cholesterol. °· To maintain healthy electrolyte levels in your blood. ° °You may also be given a specific diet to follow that includes requirements or limits for: °· Salt (sodium). °· Protein. °· Phosphorous. °· Potassium. °· Calcium. ° °Follow these instructions at home: °· Follow your prescribed diet. °· Take over-the-counter and prescription medicines only as told by your health care provider. °? Do not take any new medicines unless approved by your health care provider. Many medicines can worsen your kidney damage. °? Do not take any vitamin and mineral supplements unless approved by your health care provider. Many nutritional supplements can worsen your kidney damage. °? The dose of some medicines that you take may need to be   adjusted. °· Do not use any tobacco products, such as cigarettes, chewing tobacco, and e-cigarettes. If you need help quitting, ask your health care provider. °· Keep all follow-up visits as told by your health care provider. This is important. °· Keep track of  your blood pressure. Report changes in your blood pressure as told by your health care provider. °· Achieve and maintain a healthy weight. If you need help with this, ask your health care provider. °· Start or continue an exercise plan. Try to exercise at least 30 minutes a day, 5 days a week. °· Stay current with immunizations as told by your health care provider. °Where to find more information: °· American Association of Kidney Patients: www.aakp.org °· National Kidney Foundation: www.kidney.org °· American Kidney Fund: www.akfinc.org °· [employer] [facility]: www.lifeoptions.org and www.kidneyschool.org °Contact a health care provider if: °· Your symptoms get worse. °· You develop new symptoms. °Get help right away if: °· You have weakness in an arm or leg on one side of your body. °· You have difficulty speaking or you are slurring your speech. °· You have a sudden change in your vision. °· You have a sudden, severe headache. °· You have a sudden weight increase. °· You have difficulty breathing. °· Your symptoms suddenly get worse. °This information is not intended to replace advice given to you by your health care provider. Make sure you discuss any questions you have with your health care provider. °Document Released: 03/20/2003 Document Revised: 06/05/2015 Document Reviewed: 08/27/2011 °Elsevier Interactive Patient Education © 2017 [employer] ° °

## 2017-06-23 NOTE — Procedures (Signed)
I was present at this dialysis session. I have reviewed the session itself and made appropriate changes.   Difficulty but eventually successful cannulatio nthis AM, esp with venous needle which is deep.  K 5.6. Tol Qb 200.  Still with good UOP.    Has clip to [facility] starting Sat 6/15.  She asks about PD, could be good candidate for this and she will f/u with Dr. Jimmy Footman about it.  PTH WNL no cinacalcet needed  UF goal 1L today  I think ok for DC today, if here will not do HD to go slow on AVF.      Filed Weights   06/21/17 0438 06/22/17 0622 06/23/17 0418  Weight: 75.7 kg (166 lb 14.4 oz) 74.7 kg (164 lb 11.2 oz) 75 kg (165 lb 6.4 oz)    Recent Labs  Lab 06/21/17 1939  06/23/17 0452  NA  --    < > 137  K  --    < > 5.6*  CL  --    < > 101  CO2  --    < > 22  GLUCOSE  --    < > 228*  BUN  --    < > 104*  CREATININE  --    < > 6.76*  CALCIUM 8.4*   < > 7.9*  PHOS 5.6*  --   --    < > = values in this interval not displayed.    Recent Labs  Lab 06/17/17 1311 06/18/17 1443  WBC  --  9.5  HGB 11.8* 12.2  HCT  --  40.9  MCV  --  82.6  PLT  --  356    Scheduled Meds: . aspirin  81 mg Oral Daily  . calcitRIOL  0.25 mcg Oral Daily  . Chlorhexidine Gluconate Cloth  6 each Topical Q0600  . Chlorhexidine Gluconate Cloth  6 each Topical Q0600  . cholecalciferol  400 Units Oral Daily  . clopidogrel  75 mg Oral Q breakfast  . DULoxetine  60 mg Oral Daily  . gabapentin  300 mg Oral BID  . insulin aspart  0-9 Units Subcutaneous TID WC  . insulin glargine  10 Units Subcutaneous QHS  . metoCLOPramide  5 mg Oral TID AC & HS  . metoprolol succinate  50 mg Oral BID  . mirtazapine  15 mg Oral QHS  . mometasone-formoterol  2 puff Inhalation BID  . nortriptyline  30 mg Oral QHS  . pravastatin  20 mg Oral q1800  . sodium chloride flush  3 mL Intravenous Q12H   Continuous Infusions: . sodium chloride    . furosemide Stopped (06/23/17 7124)   PRN Meds:.sodium chloride,  acetaminophen, albuterol, cyclobenzaprine, hydrALAZINE, ondansetron (ZOFRAN) IV, sodium chloride flush   Pearson Grippe  MD 06/23/2017, 10:12 AM

## 2017-06-24 ENCOUNTER — Ambulatory Visit (HOSPITAL_COMMUNITY): Admission: RE | Admit: 2017-06-24 | Payer: Medicaid Other | Source: Ambulatory Visit

## 2017-06-24 DIAGNOSIS — N186 End stage renal disease: Secondary | ICD-10-CM | POA: Insufficient documentation

## 2017-06-24 DIAGNOSIS — R509 Fever, unspecified: Secondary | ICD-10-CM | POA: Insufficient documentation

## 2017-06-24 DIAGNOSIS — R52 Pain, unspecified: Secondary | ICD-10-CM | POA: Insufficient documentation

## 2017-06-24 DIAGNOSIS — D689 Coagulation defect, unspecified: Secondary | ICD-10-CM | POA: Insufficient documentation

## 2017-06-24 DIAGNOSIS — N2581 Secondary hyperparathyroidism of renal origin: Secondary | ICD-10-CM | POA: Insufficient documentation

## 2017-06-24 DIAGNOSIS — D631 Anemia in chronic kidney disease: Secondary | ICD-10-CM | POA: Insufficient documentation

## 2017-06-24 DIAGNOSIS — N189 Chronic kidney disease, unspecified: Secondary | ICD-10-CM | POA: Insufficient documentation

## 2017-06-30 DIAGNOSIS — D509 Iron deficiency anemia, unspecified: Secondary | ICD-10-CM | POA: Insufficient documentation

## 2017-07-01 ENCOUNTER — Encounter: Payer: Self-pay | Admitting: Podiatry

## 2017-07-01 ENCOUNTER — Ambulatory Visit: Payer: Medicaid Other | Admitting: Podiatry

## 2017-07-01 ENCOUNTER — Other Ambulatory Visit: Payer: Self-pay | Admitting: Podiatry

## 2017-07-01 ENCOUNTER — Ambulatory Visit (INDEPENDENT_AMBULATORY_CARE_PROVIDER_SITE_OTHER): Payer: Medicaid Other

## 2017-07-01 VITALS — BP 120/70 | HR 99

## 2017-07-01 DIAGNOSIS — M79675 Pain in left toe(s): Secondary | ICD-10-CM | POA: Diagnosis not present

## 2017-07-01 DIAGNOSIS — M79671 Pain in right foot: Secondary | ICD-10-CM | POA: Diagnosis not present

## 2017-07-01 DIAGNOSIS — M7751 Other enthesopathy of right foot: Secondary | ICD-10-CM | POA: Diagnosis not present

## 2017-07-01 DIAGNOSIS — S90851A Superficial foreign body, right foot, initial encounter: Secondary | ICD-10-CM

## 2017-07-01 DIAGNOSIS — M779 Enthesopathy, unspecified: Secondary | ICD-10-CM

## 2017-07-01 DIAGNOSIS — M79674 Pain in right toe(s): Secondary | ICD-10-CM

## 2017-07-01 DIAGNOSIS — M778 Other enthesopathies, not elsewhere classified: Secondary | ICD-10-CM

## 2017-07-01 DIAGNOSIS — B351 Tinea unguium: Secondary | ICD-10-CM

## 2017-07-01 NOTE — Patient Instructions (Signed)
Diabetes and Foot Care Diabetes may cause you to have problems because of poor blood supply (circulation) to your feet and legs. This may cause the skin on your feet to become thinner, break easier, and heal more slowly. Your skin may become dry, and the skin may peel and crack. You may also have nerve damage in your legs and feet causing decreased feeling in them. You may not notice minor injuries to your feet that could lead to infections or more serious problems. Taking care of your feet is one of the most important things you can do for yourself. Follow these instructions at home:  Wear shoes at all times, even in the house. Do not go barefoot. Bare feet are easily injured.  Check your feet daily for blisters, cuts, and redness. If you cannot see the bottom of your feet, use a mirror or ask someone for help.  Wash your feet with warm water (do not use hot water) and mild soap. Then pat your feet and the areas between your toes until they are completely dry. Do not soak your feet as this can dry your skin.  Apply a moisturizing lotion or petroleum jelly (that does not contain alcohol and is unscented) to the skin on your feet and to dry, brittle toenails. Do not apply lotion between your toes.  Trim your toenails straight across. Do not dig under them or around the cuticle. File the edges of your nails with an emery board or nail file.  Do not cut corns or calluses or try to remove them with medicine.  Wear clean socks or stockings every day. Make sure they are not too tight. Do not wear knee-high stockings since they may decrease blood flow to your legs.  Wear shoes that fit properly and have enough cushioning. To break in new shoes, wear them for just a few hours a day. This prevents you from injuring your feet. Always look in your shoes before you put them on to be sure there are no objects inside.  Do not cross your legs. This may decrease the blood flow to your feet.  If you find a  minor scrape, cut, or break in the skin on your feet, keep it and the skin around it clean and dry. These areas may be cleansed with mild soap and water. Do not cleanse the area with peroxide, alcohol, or iodine.  When you remove an adhesive bandage, be sure not to damage the skin around it.  If you have a wound, look at it several times a day to make sure it is healing.  Do not use heating pads or hot water bottles. They may burn your skin. If you have lost feeling in your feet or legs, you may not know it is happening until it is too late.  Make sure your health care provider performs a complete foot exam at least annually or more often if you have foot problems. Report any cuts, sores, or bruises to your health care provider immediately. Contact a health care provider if:  You have an injury that is not healing.  You have cuts or breaks in the skin.  You have an ingrown nail.  You notice redness on your legs or feet.  You feel burning or tingling in your legs or feet.  You have pain or cramps in your legs and feet.  Your legs or feet are numb.  Your feet always feel cold. Get help right away if:  There is increasing   redness, swelling, or pain in or around a wound.  There is a red line that goes up your leg.  Pus is coming from a wound.  You develop a fever or as directed by your health care provider.  You notice a bad smell coming from an ulcer or wound. This information is not intended to replace advice given to you by your health care provider. Make sure you discuss any questions you have with your health care provider. Document Released: 12/26/1999 Document Revised: 06/05/2015 Document Reviewed: 06/06/2012 Elsevier Interactive Patient Education  2017 [employer]  Diabetic Neuropathy Diabetic neuropathy is a nerve disease or nerve damage that is caused by diabetes mellitus. About half of all people with diabetes mellitus have some form of nerve damage. Nerve damage  is more common in those who have had diabetes mellitus for many years and who generally have not had good control of their blood sugar (glucose) level. Diabetic neuropathy is a common complication of diabetes mellitus. There are three common types of diabetic neuropathy and a fourth type that is less common and less understood:  Peripheral neuropathy-This is the most common type of diabetic neuropathy. It causes damage to the nerves of the feet and legs first and then eventually the hands and arms. The damage affects the ability to sense touch.  Autonomic neuropathy-This type causes damage to the autonomic nervous system, which controls the following functions: ? Heartbeat. ? Body temperature. ? Blood pressure. ? Urination. ? Digestion. ? Sweating. ? Sexual function.  Focal neuropathy-Focal neuropathy can be painful and unpredictable and occurs most often in older adults with diabetes mellitus. It involves a specific nerve or one area and often comes on suddenly. It usually does not cause long-term problems.  Radiculoplexus neuropathy- Sometimes called lumbosacral radiculoplexus neuropathy, radiculoplexus neuropathy affects the nerves of the thighs, hips, buttocks, or legs. It is more common in people with type 2 diabetes mellitus and in older men. It is characterized by debilitating pain, weakness, and atrophy, usually in the thigh muscles.  What are the causes? The cause of peripheral, autonomic, and focal neuropathies is diabetes mellitus that is uncontrolled and high glucose levels. The cause of radiculoplexus neuropathy is unknown. However, it is thought to be caused by inflammation related to uncontrolled glucose levels. What are the signs or symptoms? Peripheral Neuropathy Peripheral neuropathy develops slowly over time. When the nerves of the feet and legs no longer work there may be:  Burning, stabbing, or aching pain in the legs or feet.  Inability to feel pressure or pain in your  feet. This can lead to: ? Thick calluses over pressure areas. ? Pressure sores. ? Ulcers.  Foot deformities.  Reduced ability to feel temperature changes.  Muscle weakness.  Autonomic Neuropathy The symptoms of autonomic neuropathy vary depending on which nerves are affected. Symptoms may include:  Problems with digestion, such as: ? Feeling sick to your stomach (nausea). ? Vomiting. ? Bloating. ? Constipation. ? Diarrhea. ? Abdominal pain.  Difficulty with urination. This occurs if you lose your ability to sense when your bladder is full. Problems include: ? Urine leakage (incontinence). ? Inability to empty your bladder completely (retention).  Rapid or irregular heartbeat (palpitations).  Blood pressure drops when you stand up (orthostatic hypotension). When you stand up you may feel: ? Dizzy. ? Weak. ? Faint.  In men, inability to attain and maintain an erection.  In women, vaginal dryness and problems with decreased sexual desire and arousal.  Problems with body temperature   regulation.  Increased or decreased sweating.  Focal Neuropathy  Abnormal eye movements or abnormal alignment of both eyes.  Weakness in the wrist.  Foot drop. This results in an inability to lift the foot properly and abnormal walking or foot movement.  Paralysis on one side of your face (Bell palsy).  Chest or abdominal pain. Radiculoplexus Neuropathy  Sudden, severe pain in your hip, thigh, or buttocks.  Weakness and wasting of thigh muscles.  Difficulty rising from a seated position.  Abdominal swelling.  Unexplained weight loss (usually more than 10 lb [4.5 kg]). How is this diagnosed? Peripheral Neuropathy Your senses may be tested. Sensory function testing can be done with:  A light touch using a monofilament.  A vibration with tuning fork.  A sharp sensation with a pin prick.  Other tests that can help diagnose neuropathy are:  Nerve conduction velocity. This  test checks the transmission of an electrical current through a nerve.  Electromyography. This shows how muscles respond to electrical signals transmitted by nearby nerves.  Quantitative sensory testing. This is used to assess how your nerves respond to vibrations and changes in temperature.  Autonomic Neuropathy Diagnosis is often based on reported symptoms. Tell your health care provider if you experience:  Dizziness.  Constipation.  Diarrhea.  Inappropriate urination or inability to urinate.  Inability to get or maintain an erection.  Tests that may be done include:  Electrocardiography or Holter monitor. These are tests that can help show problems with the heart rate or heart rhythm.  An X-ray exam may be done.  Focal Neuropathy Diagnosis is made based on your symptoms and what your health care provider finds during your exam. Other tests may be done. They may include:  Nerve conduction velocities. This checks the transmission of electrical current through a nerve.  Electromyography. This shows how muscles respond to electrical signals transmitted by nearby nerves.  Quantitative sensory testing. This test is used to assess how your nerves respond to vibration and changes in temperature.  Radiculoplexus Neuropathy  Often the first thing is to eliminate any other issue or problems that might be the cause, as there is no standard test for diagnosis.  X-ray exam of your spine and lumbar region.  Spinal tap to rule out cancer.  MRI to rule out other lesions. How is this treated? Once nerve damage occurs, it cannot be reversed. The goal of treatment is to keep the disease or nerve damage from getting worse and affecting more nerve fibers. Controlling your blood glucose level is the key. Most people with radiculoplexus neuropathy see at least a partial improvement over time. You will need to keep your blood glucose and HbA1c levels in the target range determined by your  health care provider. Things that help control blood glucose levels include:  Blood glucose monitoring.  Meal planning.  Physical activity.  Diabetes medicine.  Over time, maintaining lower blood glucose levels helps lessen symptoms. Sometimes, prescription pain medicine is needed. Follow these instructions at home:  Do not smoke.  Keep your blood glucose level in the range that you and your health care provider have determined acceptable for you.  Keep your blood pressure level in the range that you and your health care provider have determined acceptable for you.  Eat a well-balanced diet.  Be physically active every day. Include strength training and balance exercises.  Protect your feet. ? Check your feet every day for sores, cuts, blisters, or signs of infection. ? Wear padded socks   and supportive shoes. Use orthotic inserts, if necessary. ? Regularly check the insides of your shoes for worn spots. Make sure there are no rocks or other items inside your shoes before you put them on. Contact a health care provider if:  You have burning, stabbing, or aching pain in the legs or feet.  You are unable to feel pressure or pain in your feet.  You develop problems with digestion such as: ? Nausea. ? Vomiting. ? Bloating. ? Constipation. ? Diarrhea. ? Abdominal pain.  You have difficulty with urination, such as: ? Incontinence. ? Retention.  You have palpitations.  You develop orthostatic hypotension. When you stand up you may feel: ? Dizzy. ? Weak. ? Faint.  You cannot attain and maintain an erection (in men).  You have vaginal dryness and problems with decreased sexual desire and arousal (in women).  You have severe pain in your thighs, legs, or buttocks.  You have unexplained weight loss. This information is not intended to replace advice given to you by your health care provider. Make sure you discuss any questions you have with your health care  provider. Document Released: 03/08/2001 Document Revised: 06/05/2015 Document Reviewed: 06/08/2012 Elsevier Interactive Patient Education  2017 [employer] Inc.  

## 2017-07-05 ENCOUNTER — Inpatient Hospital Stay: Admission: RE | Admit: 2017-07-05 | Payer: Self-pay | Source: Ambulatory Visit

## 2017-07-05 NOTE — Progress Notes (Addendum)
Subjective: Patient presents today referred by Doctor for Comprehensive Diabetic Foot Examination.   Patient has diabetes and cc of painful neuropathy. She relates pain is sharp and burning in nature and usually occurs at night. She relates she cannot get any relief. She is on Cymbalta and gabapentin for neuropathic pain. She relates being seen by Neurologist for neuropathy and she has been referred to a pain specialist and has yet to schedule that visit.  Ms. Susan Horton also relates history of sustained foreign body (sewing needle) in her right foot. She states needle is still in her foot and would like an updated xray.  Patient diagnosed with Type 2 diabetes  Current Diabetes Management Regimen is: Insulin (Humulin and Lantus Solostar)  Last A1C: 8.0  Medical History: Pt  has peripheral neuropathy Pt has cardiovascular disease Pt has nephropathy CKD stage 4  Current History: 1. Any change in the foot or feet since last evaluation? N/A 2.  Current ulcer or history of foot ulcer? No 3. Is there pain in the calf muscles when walking that is relieved by rest? No.  Foot Examination:  Vascular Examination: Capillary refill time <3 seconds x 10 digits Dorsalis pedis pulses  b/l Posterior tibial pulses b/l No digital hair x 10 digits Skin temperature warm to cool b/l  Dermatological Examination:  Toenails 1-5 b/l discolored, thick, dystrophic with subungual debris and pain with palpation to nailbeds due to thickness of nails.  Callus(es) noted: No Redness noted: No Warmth noted: No Fissure noted: No Swelling noted: No Ulcer noted: No Maceration noted: No Preulcerative lesion noted: No Dry skin noted: No  Musculoskeletal: Muscle strength 5/5 to all LE muscle groups   Sensory Foot Examination: Sensation tested with 10 gram monofilament: Right great toe plantarly + Right 4th toe plantarly + Submetatarsal head 1 right foot - Submetatarsal head 3 right foot- Submetatarsal  head 5 right foot- Left great toe plantarly + Left 4th toe plantarly + Submetatarsal head 1 left foot- Submetatarsal head 3 left foot- Submetatarsal head 5 left foot-  Risk Categorization: Low Risk Patient has all of the following: Intact protective sensation No prior foot ulcer  No severe deformity Pedal pulses present  High Risk Patient has one or more of the following: Loss of protective sensation Yes Absent pedal pulses Severe Foot deformity History of foot ulcer History of retained foreign body right foot Yes  Footwear Assessment: Does the patient wear appropriate shoes? Yes Does the patient need inserts/orthotics? No   Xray interpretation right foot: Sewing needle in soft tissue along under 4th metatarsal. Needle is broken in half, but is stable.   Assessment: 1. Painful onychomycosis toenails 1-5 b/l 2. NIDDM with Peripheral neuropathy 3. Retained foreign body right foot  Management Plan: Patient underwent complete diabetic foot examination on today. 1. Pt education provided for preventative foot care 2. Toenails 1-5 b/l were debrided in length and girth without iatrogenic bleeding. 3. Dispensed written literature on diabetic foot care and neuropathy. 4. Patient to report any pedal injuries to medical professional  5. Monitor foreign body right foot; asymptomatic today and no signs of infection noted right foot. 6.  Patient will need routine diabetic foot care. Follow up 3 months.  7. Patient to see pain specialist per Neurologist referral. She will schedule appt. 8.  Patient/POA to  call should there be a concern in the interim.

## 2017-07-07 DIAGNOSIS — Z4802 Encounter for removal of sutures: Secondary | ICD-10-CM | POA: Insufficient documentation

## 2017-07-08 ENCOUNTER — Encounter: Payer: Self-pay | Admitting: Cardiovascular Disease

## 2017-07-08 ENCOUNTER — Ambulatory Visit (INDEPENDENT_AMBULATORY_CARE_PROVIDER_SITE_OTHER): Payer: Medicaid Other | Admitting: Cardiovascular Disease

## 2017-07-08 VITALS — BP 130/62 | HR 95 | Ht 65.0 in | Wt 166.0 lb

## 2017-07-08 DIAGNOSIS — R0989 Other specified symptoms and signs involving the circulatory and respiratory systems: Secondary | ICD-10-CM | POA: Diagnosis not present

## 2017-07-08 DIAGNOSIS — E785 Hyperlipidemia, unspecified: Secondary | ICD-10-CM

## 2017-07-08 DIAGNOSIS — I2510 Atherosclerotic heart disease of native coronary artery without angina pectoris: Secondary | ICD-10-CM | POA: Diagnosis not present

## 2017-07-08 DIAGNOSIS — I10 Essential (primary) hypertension: Secondary | ICD-10-CM

## 2017-07-08 DIAGNOSIS — I25119 Atherosclerotic heart disease of native coronary artery with unspecified angina pectoris: Secondary | ICD-10-CM | POA: Diagnosis not present

## 2017-07-08 DIAGNOSIS — I5033 Acute on chronic diastolic (congestive) heart failure: Secondary | ICD-10-CM

## 2017-07-08 DIAGNOSIS — Z9861 Coronary angioplasty status: Secondary | ICD-10-CM

## 2017-07-08 NOTE — Assessment & Plan Note (Signed)
History of hyperlipidemia on statin therapy. We will recheck a lipid and liver profile 

## 2017-07-08 NOTE — Assessment & Plan Note (Signed)
Diastolic heart failure with recent 6/8 through the 13th of this month.  Her LV function is normal.  She does have grade 2 diastolic dysfunction mild LVH.  Her edema improved with dialysis which she is currently on for the last 2 weeks.

## 2017-07-08 NOTE — Assessment & Plan Note (Signed)
History of essential hypertension her blood pressure measured today at 130/62.  Prolonged.  Continue current meds at current dosing.

## 2017-07-08 NOTE — Assessment & Plan Note (Signed)
Mid LAD PCI and drug-eluting stenting 08/05/2016 with a synergy drug-eluting stent.  She did have moderate disease in her proximal mid dominant RCA which I did not think was physiologically significant.  Subsequent Myoview stress test 03/29/2017 basically nonischemic she does get chest pain when she is volume overload but otherwise denies chest pain.

## 2017-07-08 NOTE — Patient Instructions (Addendum)
Medication Instructions:   NO CHANGE  Labwork:  Your physician recommends that you return for lab work PRIOR TO EATING  Your physician has requested that you have a carotid duplex. This test is an ultrasound of the carotid arteries in your neck. It looks at blood flow through these arteries that supply the brain with blood. Allow one hour for this exam. There are no restrictions or special instructions.    Follow-Up:  Your physician recommends that you schedule a follow-up appointment in: [place] physician wants you to follow-up in: [place] will receive a reminder letter in the mail two months in advance. If you don't receive a letter, please call our office to schedule the follow-up appointment.    If you need a refill on your cardiac medications before your next appointment, please call your pharmacy.

## 2017-07-08 NOTE — Progress Notes (Signed)
07/08/2017 Susan Horton   22-Aug-1970  [id number]  Primary Physician Charlott Rakes, MD Primary Cardiologist: [name] MD [name], [place]  HPI:  Susan Horton is a 47 y.o.  moderately overweight single African-American female mother of 58, grandmother and 4 grandchildren who was seen by Kerin Ransom on 06/15/16 because of new onset chest pain. I last saw her in the office 08/13/2016 She has a history of treated hypertension, hyperlipidemia and diabetes which she's had for the last 22 years. She has CKD 3 with creatinine running in the mid 3 range followed by Dr. Jimmy Footman. She's noticed chest pain over the last 3-4 months occurring several times a week lasting minutes at a time worse with exertion. She had a Myoview stress test was read as intermediate risk with inferior ischemia. Based on this, I decided to proceed with outpatient radial diagnostic coronary angiography using limited contrast. This was performed 08/05/16 the right femoral approach revealed an 80% mid LAD which was stented using a synergy drug-eluting stent reducing 80% stenosis to 0% residual. She did have moderate mid RCA stenosis which was not addressed. She had left main spasm probably related to the guide catheter doing a procedure that responded to intra-coronary nitroglycerin. Her chest pain has resolved and she has stopped smoking since. She did have carotid Dopplers performed because of bruits that showed mild to moderate left ICA stenosis which will be followed on an annual basis.      Current Meds  Medication Sig  . albuterol (PROVENTIL HFA;VENTOLIN HFA) 108 (90 BASE) MCG/ACT inhaler Inhale 1-2 puffs into the lungs every 6 (six) hours as needed for wheezing or shortness of breath.   [name] aspirin 81 MG chewable tablet Chew 1 tablet (81 mg total) by mouth daily.  . Cholecalciferol (VITAMIN D-3 PO) Take 1 capsule by mouth daily.  . clopidogrel (PLAVIX) 75 MG tablet Take 1 tablet (75 mg total) by mouth daily  with breakfast.  . cyclobenzaprine (FLEXERIL) 5 MG tablet Take 1 tablet (5 mg total) by mouth at bedtime as needed for muscle spasms.  . DULoxetine (CYMBALTA) 60 MG capsule Take 1 capsule (60 mg total) by mouth daily.  . Ferrous Sulfate (IRON) 325 (65 Fe) MG TABS Take 325 mg by mouth daily.  . Fluticasone-Salmeterol (ADVAIR) 250-50 MCG/DOSE AEPB Inhale 1 puff into the lungs 2 (two) times daily.  [name] gabapentin (NEURONTIN) 300 MG capsule Take 1 capsule (300 mg total) by mouth 2 (two) times daily.  . Insulin Glargine (TOUJEO SOLOSTAR) 300 UNIT/ML SOPN Inject 15 Units into the skin 2 (two) times daily.  . insulin regular (HUMULIN R) 100 units/mL injection Use with meals. CBG 121-150: 2 u; CBG 151-200: 3 u; CBG 201-250: 5 u; CBG 251-300: 8 u; CBG 301-350: 11; CBG 351-400: 15 u  . lovastatin (MEVACOR) 20 MG tablet Take 20 mg by mouth at bedtime.   . metoCLOPramide (REGLAN) 5 MG tablet TAKE 1 TABLET BY MOUTH 4 TIMES DAILY BEFORE MEALS AND AT BEDTIME.  . metoprolol succinate (TOPROL-XL) 50 MG 24 hr tablet Take 50 mg by mouth 2 (two) times daily.  . mirtazapine (REMERON SOL-TAB) 15 MG disintegrating tablet TAKE 1 TABLET BY MOUTH AT BEDTIME.  . montelukast (SINGULAIR) 10 MG tablet Take 10 mg by mouth daily as needed (allergies).   . multivitamin (RENA-VIT) TABS tablet Take 1 tablet by mouth at bedtime.  . nitroGLYCERIN (NITROSTAT) 0.4 MG SL tablet Place 1 tablet (0.4 mg total) under the tongue  every 5 (five) minutes as needed. (Patient taking differently: Place 0.4 mg under the tongue every 5 (five) minutes as needed for chest pain. )  . nortriptyline (PAMELOR) 10 MG capsule Take 3 tablet at bedtime. (Patient taking differently: Take 30 mg by mouth at bedtime. )  . ondansetron (ZOFRAN) 4 MG tablet Take 1 tablet (4 mg total) by mouth daily as needed for nausea or vomiting.  . pantoprazole (PROTONIX) 40 MG tablet TAKE 1 TABLET BY MOUTH DAILY.  [name] senna-docusate (SENOKOT-S) 8.6-50 MG tablet Take 1 tablet by mouth  2 (two) times daily. (Patient taking differently: Take 1 tablet by mouth 2 (two) times daily as needed for mild constipation. )  . trimethoprim-polymyxin b (POLYTRIM) ophthalmic solution INSTILL ONE DROP INTO BOTH EYES 4 TIMES A DAY FOR 2 DAYS AFTER EACH MONTHLY EYE INJECTION     Allergies  Allergen Reactions  . Penicillins Anaphylaxis, Itching, Swelling, Rash and Other (See Comments)    Swelling of throat & whole mouth  Has patient had a PCN reaction causing immediate rash, facial/tongue/throat swelling, SOB or lightheadedness with hypotension: Yes Has patient had a PCN reaction causing severe rash involving mucus membranes or skin necrosis: No Has patient had a PCN reaction that required hospitalization: No Has patient had a PCN reaction occurring within the last 10 years: No If all of the above answers are "NO", then may proceed with Cephalosporin use.  [name] Lisinopril Cough    Social History   Socioeconomic History  . Marital status: Single    Spouse name: Not on file  . Number of children: 3  . Years of education: 7  . Highest education level: Not on file  Occupational History  . Occupation: Curator  Social Needs  . Financial resource strain: Not on file  . Food insecurity:    Worry: Not on file    Inability: Not on file  . Transportation needs:    Medical: Not on file    Non-medical: Not on file  Tobacco Use  . Smoking status: Former Smoker    Years: 20.00    Types: Cigarettes    Last attempt to quit: 11/2016    Years since quitting: 0.6  . Smokeless tobacco: Never Used  . Tobacco comment: Smokes on rare occasions  Substance and Sexual Activity  . Alcohol use: No    Alcohol/week: 0.0 oz  . Drug use: No  . Sexual activity: Not on file  Lifestyle  . Physical activity:    Days per week: Not on file    Minutes per session: Not on file  . Stress: Not on file  Relationships  . Social connections:    Talks on phone: Not on file    Gets together: Not on  file    Attends religious service: Not on file    Active member of club or organization: Not on file    Attends meetings of clubs or organizations: Not on file    Relationship status: Not on file  . Intimate partner violence:    Fear of current or ex partner: Not on file    Emotionally abused: Not on file    Physically abused: Not on file    Forced sexual activity: Not on file  Other Topics Concern  . Not on file  Social History Narrative   Lives with daughter in a 2 story home.  Has 3 children.  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  Education: college.  Review of Systems: General: negative for chills, fever, night sweats or weight changes.  Cardiovascular: negative for chest pain, dyspnea on exertion, edema, orthopnea, palpitations, paroxysmal nocturnal dyspnea or shortness of breath Dermatological: negative for rash Respiratory: negative for cough or wheezing Urologic: negative for hematuria Abdominal: negative for nausea, vomiting, diarrhea, bright red blood per rectum, melena, or hematemesis Neurologic: negative for visual changes, syncope, or dizziness All other systems reviewed and are otherwise negative except as noted above.    Blood pressure 130/62, pulse 95, height 5\' 5"  (1.651 m), weight 166 lb (75.3 kg).  General appearance: alert and no distress Neck: no adenopathy, no JVD, supple, symmetrical, trachea midline, thyroid not enlarged, symmetric, no tenderness/mass/nodules and Bilateral carotid bruits Lungs: clear to auscultation bilaterally Heart: regular rate and rhythm, S1, S2 normal, no murmur, click, rub or gallop Extremities: extremities normal, atraumatic, no cyanosis or edema Pulses: 2+ and symmetric Skin: Skin color, texture, turgor normal. No rashes or lesions Neurologic: Alert and oriented X 3, normal strength and tone. Normal symmetric reflexes. Normal coordination and gait  EKG performed today  ASSESSMENT AND PLAN:   CAD  S/P mLAD PCI with DES Mid LAD PCI and drug-eluting stenting 08/05/2016 with a synergy drug-eluting stent.  She did have moderate disease in her proximal mid dominant RCA which I did not think was physiologically significant.  Subsequent Myoview stress test 03/29/2017 basically nonischemic she does get chest pain when she is volume overload but otherwise denies chest pain.  Essential hypertension, benign History of essential hypertension her blood pressure measured today at 130/62.  Prolonged.  Continue current meds at current dosing.  Dyslipidemia History of hyper lipidemia on statin therapy.  We will recheck a lipid and liver profile  Smoker History of discontinued tobacco abuse in August of last year.  Coronary artery disease involving native coronary artery of native heart with angina pectoris ([place]) History of CAD status post LAD stenting by myself approach radially but was unable to get a brachiocephalic tortuosity.  She had moderate disease in her proximal mid RCA.  She had a negative Myoview March of this year is relatively asymptomatic.  Acute on chronic diastolic CHF (congestive heart failure) (HCC) Diastolic heart failure with recent 6/8 through the 13th of this month.  Her LV function is normal.  She does have grade 2 diastolic dysfunction mild LVH.  Her edema improved with dialysis which she is currently on for the last 2 weeks.      [name] MD FACP,FACC,FAHA, [facility] 07/08/2017 2:15 PM

## 2017-07-08 NOTE — Assessment & Plan Note (Signed)
History of discontinued tobacco abuse in August of last year.

## 2017-07-08 NOTE — Assessment & Plan Note (Signed)
History of CAD status post LAD stenting by myself approach radially but was unable to get a brachiocephalic tortuosity.  She had moderate disease in her proximal mid RCA.  She had a negative Myoview March of this year is relatively asymptomatic.

## 2017-07-13 ENCOUNTER — Ambulatory Visit (HOSPITAL_COMMUNITY)
Admission: RE | Admit: 2017-07-13 | Discharge: 2017-07-13 | Disposition: A | Discharge status: Home / Self Care | Payer: Medicaid Other | Source: Ambulatory Visit | Attending: Cardiology | Admitting: Cardiology

## 2017-07-13 ENCOUNTER — Telehealth: Payer: Self-pay | Admitting: Neurology

## 2017-07-13 ENCOUNTER — Other Ambulatory Visit: Payer: Self-pay | Admitting: *Deleted

## 2017-07-13 DIAGNOSIS — E1140 Type 2 diabetes mellitus with diabetic neuropathy, unspecified: Secondary | ICD-10-CM

## 2017-07-13 DIAGNOSIS — R0989 Other specified symptoms and signs involving the circulatory and respiratory systems: Secondary | ICD-10-CM

## 2017-07-13 DIAGNOSIS — I6523 Occlusion and stenosis of bilateral carotid arteries: Secondary | ICD-10-CM | POA: Insufficient documentation

## 2017-07-13 DIAGNOSIS — E1342 Other specified diabetes mellitus with diabetic polyneuropathy: Secondary | ICD-10-CM

## 2017-07-13 NOTE — Telephone Encounter (Signed)
Referral sent in epic 

## 2017-07-13 NOTE — Telephone Encounter (Signed)
Left message for patient to call me back. 

## 2017-07-13 NOTE — Telephone Encounter (Signed)
Where would you like for me to send referral?  [facility]?

## 2017-07-13 NOTE — Telephone Encounter (Signed)
Please refer to [facility] - pain management for diabetic painful neuropathy.

## 2017-07-13 NOTE — Telephone Encounter (Signed)
Susan Horton Self [phone]  [name] called to inquire about a referral to Pain Management.

## 2017-07-15 ENCOUNTER — Other Ambulatory Visit: Payer: Self-pay | Admitting: *Deleted

## 2017-07-15 DIAGNOSIS — R0989 Other specified symptoms and signs involving the circulatory and respiratory systems: Secondary | ICD-10-CM

## 2017-07-18 DIAGNOSIS — N189 Chronic kidney disease, unspecified: Secondary | ICD-10-CM | POA: Insufficient documentation

## 2017-07-18 DIAGNOSIS — Z862 Personal history of diseases of the blood and blood-forming organs and certain disorders involving the immune mechanism: Secondary | ICD-10-CM | POA: Insufficient documentation

## 2017-07-18 DIAGNOSIS — O2230 Deep phlebothrombosis in pregnancy, unspecified trimester: Secondary | ICD-10-CM | POA: Insufficient documentation

## 2017-07-21 LAB — HEPATIC FUNCTION PANEL
ALT: 7 IU/L (ref 0–32)
AST: 8 IU/L (ref 0–40)
Albumin: 3.2 g/dL — ABNORMAL LOW (ref 3.5–5.5)
Alkaline Phosphatase: 114 IU/L (ref 39–117)
Bilirubin, Direct: 0.06 mg/dL (ref 0.00–0.40)
Total Protein: 6.7 g/dL (ref 6.0–8.5)

## 2017-07-21 LAB — LIPID PANEL
CHOLESTEROL TOTAL: 248 mg/dL — AB (ref 100–199)
Chol/HDL Ratio: 7.5 ratio — ABNORMAL HIGH (ref 0.0–4.4)
HDL: 33 mg/dL — ABNORMAL LOW (ref >39)
LDL CALC: 165 mg/dL — AB (ref 0–99)
TRIGLYCERIDES: 248 mg/dL — AB (ref 0–149)
VLDL CHOLESTEROL CAL: 50 mg/dL — AB (ref 5–40)

## 2017-07-27 ENCOUNTER — Encounter (INDEPENDENT_AMBULATORY_CARE_PROVIDER_SITE_OTHER): Payer: Medicaid Other | Admitting: Ophthalmology

## 2017-07-27 ENCOUNTER — Other Ambulatory Visit: Payer: Self-pay | Admitting: *Deleted

## 2017-07-27 DIAGNOSIS — E785 Hyperlipidemia, unspecified: Secondary | ICD-10-CM

## 2017-07-28 ENCOUNTER — Encounter (INDEPENDENT_AMBULATORY_CARE_PROVIDER_SITE_OTHER): Payer: Medicaid Other | Admitting: Ophthalmology

## 2017-08-04 ENCOUNTER — Encounter: Payer: Self-pay | Admitting: Physical Medicine & Rehabilitation

## 2017-08-15 ENCOUNTER — Encounter (INDEPENDENT_AMBULATORY_CARE_PROVIDER_SITE_OTHER): Payer: Medicaid Other | Admitting: Ophthalmology

## 2017-08-16 ENCOUNTER — Telehealth: Payer: Self-pay | Admitting: *Deleted

## 2017-08-16 NOTE — Telephone Encounter (Signed)
Spoke with patient, explained procedure Dr. Bridgett Larsson had requested to be schedule for her and did not need to keep office appointment with him per his note. She  states staff at [facility]   are not having problem with access and treatment at present. She does not wish to schedule any procedure at this time. Informed her to have [facility] contact us if any problems and she needs further follow up with access. Spoke to [employer] . She is to call us back if need to get this scheduled.

## 2017-08-17 ENCOUNTER — Encounter: Payer: Self-pay | Admitting: Vascular Surgery

## 2017-08-17 ENCOUNTER — Ambulatory Visit: Payer: Medicaid Other | Admitting: Vascular Surgery

## 2017-08-24 ENCOUNTER — Ambulatory Visit: Payer: Medicaid Other

## 2017-08-24 ENCOUNTER — Telehealth: Payer: Self-pay | Admitting: *Deleted

## 2017-08-24 NOTE — Telephone Encounter (Signed)
Rocelle at [facility] called this nurse and wanted to go ahead and  get patient scheduled for superficializtion due to patient c/o pain with treatment. I spoke to [place] and she stated her pain was due to "neoropathy and was not going to schedule surgery at this time. I left a message with Rocelle with what patient said.

## 2017-08-24 NOTE — Progress Notes (Deleted)
Patient ID: BRIONY PARVEEN                 DOB: 1970-01-16                    MRN: [id number]     HPI: ARMINDA FOGLIO is a 47 y.o. female patient referred to lipid clinic by Dr Gwenlyn Found. PMH is significant for  CAD s/p stent placement, hypertension, hyperlipidemia,  HF, asthma, DM-II, and CKD-4.   Current Medications: Lovastatin 20mg  daily   Intolerances:  atorvastatin 40mg  daily Pravastatin 20mg  daily  LDL goal: < 70mg /dL  Diet:   Exercise:   Family History:   Social History: former smoker, denies alcohol use  Labs: 07/21/17 CHO 248; TG 248; HDL 33; LDL-c 165 (lovastatin 20mg  daily)  Past Medical History:  Diagnosis Date  . Anemia   . Asthma   . CAD (coronary artery disease)    DES to mid LAD July 2018, residual moderate RCA disease  . Cataract   . CKD (chronic kidney disease) stage 4, GFR 15-29 ml/min (HCC)   . DVT (deep venous thrombosis) ([place])    1996 during pregnancy, 2015 left leg  . Essential hypertension 12/11/2015  . Gastroparesis   . GERD (gastroesophageal reflux disease)   . Hidradenitis   . Migraine   . Neuropathy   . Type 2 diabetes mellitus ([place])   . Type II diabetes mellitus ([place])     Current Outpatient Medications on File Prior to Visit  Medication Sig Dispense Refill  . albuterol (PROVENTIL HFA;VENTOLIN HFA) 108 (90 BASE) MCG/ACT inhaler Inhale 1-2 puffs into the lungs every 6 (six) hours as needed for wheezing or shortness of breath.     [name] aspirin 81 MG chewable tablet Chew 1 tablet (81 mg total) by mouth daily. 30 tablet 0  . Cholecalciferol (VITAMIN D-3 PO) Take 1 capsule by mouth daily.    . clopidogrel (PLAVIX) 75 MG tablet Take 1 tablet (75 mg total) by mouth daily with breakfast. 90 tablet 2  . cyclobenzaprine (FLEXERIL) 5 MG tablet Take 1 tablet (5 mg total) by mouth at bedtime as needed for muscle spasms. 30 tablet 3  . DULoxetine (CYMBALTA) 60 MG capsule Take 1 capsule (60 mg total) by mouth daily. 30 capsule 3  . Ferrous Sulfate (IRON) 325 (65  Fe) MG TABS Take 325 mg by mouth daily. 30 each 3  . Fluticasone-Salmeterol (ADVAIR) 250-50 MCG/DOSE AEPB Inhale 1 puff into the lungs 2 (two) times daily. 60 each 3  . gabapentin (NEURONTIN) 300 MG capsule Take 1 capsule (300 mg total) by mouth 2 (two) times daily.    . Insulin Glargine (TOUJEO SOLOSTAR) 300 UNIT/ML SOPN Inject 15 Units into the skin 2 (two) times daily. 12 pen 0  . insulin regular (HUMULIN R) 100 units/mL injection Use with meals. CBG 121-150: 2 u; CBG 151-200: 3 u; CBG 201-250: 5 u; CBG 251-300: 8 u; CBG 301-350: 11; CBG 351-400: 15 u 30 mL 5  . lovastatin (MEVACOR) 20 MG tablet Take 20 mg by mouth at bedtime.     . metoCLOPramide (REGLAN) 5 MG tablet TAKE 1 TABLET BY MOUTH 4 TIMES DAILY BEFORE MEALS AND AT BEDTIME. 90 tablet 0  . metoprolol succinate (TOPROL-XL) 50 MG 24 hr tablet Take 50 mg by mouth 2 (two) times daily.  6  . mirtazapine (REMERON SOL-TAB) 15 MG disintegrating tablet TAKE 1 TABLET BY MOUTH AT BEDTIME. 30 tablet 0  . montelukast (SINGULAIR) 10 MG  tablet Take 10 mg by mouth daily as needed (allergies).     . multivitamin (RENA-VIT) TABS tablet Take 1 tablet by mouth at bedtime.  0  . nitroGLYCERIN (NITROSTAT) 0.4 MG SL tablet Place 1 tablet (0.4 mg total) under the tongue every 5 (five) minutes as needed. (Patient taking differently: Place 0.4 mg under the tongue every 5 (five) minutes as needed for chest pain. ) 25 tablet 3  . nortriptyline (PAMELOR) 10 MG capsule Take 3 tablet at bedtime. (Patient taking differently: Take 30 mg by mouth at bedtime. ) 120 capsule 3  . ondansetron (ZOFRAN) 4 MG tablet Take 1 tablet (4 mg total) by mouth daily as needed for nausea or vomiting. 30 tablet 0  . pantoprazole (PROTONIX) 40 MG tablet TAKE 1 TABLET BY MOUTH DAILY. 30 tablet 2  . senna-docusate (SENOKOT-S) 8.6-50 MG tablet Take 1 tablet by mouth 2 (two) times daily. (Patient taking differently: Take 1 tablet by mouth 2 (two) times daily as needed for mild constipation. )      . trimethoprim-polymyxin b (POLYTRIM) ophthalmic solution INSTILL ONE DROP INTO BOTH EYES 4 TIMES A DAY FOR 2 DAYS AFTER EACH MONTHLY EYE INJECTION  12   No current facility-administered medications on file prior to visit.     Allergies  Allergen Reactions  . Penicillins Anaphylaxis, Itching, Swelling, Rash and Other (See Comments)    Swelling of throat & whole mouth  Has patient had a PCN reaction causing immediate rash, facial/tongue/throat swelling, SOB or lightheadedness with hypotension: Yes Has patient had a PCN reaction causing severe rash involving mucus membranes or skin necrosis: No Has patient had a PCN reaction that required hospitalization: No Has patient had a PCN reaction occurring within the last 10 years: No If all of the above answers are "NO", then may proceed with Cephalosporin use.  [name] Lisinopril Cough    No problem-specific Assessment & Plan notes found for this encounter.    Robet Crutchfield Rodriguez-Guzman PharmD, BCPS, [place] [address] [place] [address] 08/24/2017 7:53 AM

## 2017-08-29 ENCOUNTER — Encounter (INDEPENDENT_AMBULATORY_CARE_PROVIDER_SITE_OTHER): Payer: Medicaid Other | Admitting: Ophthalmology

## 2017-08-31 ENCOUNTER — Other Ambulatory Visit: Payer: Self-pay

## 2017-08-31 DIAGNOSIS — T82510A Breakdown (mechanical) of surgically created arteriovenous fistula, initial encounter: Secondary | ICD-10-CM

## 2017-08-31 DIAGNOSIS — N184 Chronic kidney disease, stage 4 (severe): Secondary | ICD-10-CM

## 2017-09-01 ENCOUNTER — Ambulatory Visit: Payer: Medicaid Other | Admitting: Physical Medicine & Rehabilitation

## 2017-09-07 ENCOUNTER — Other Ambulatory Visit: Payer: Self-pay | Admitting: *Deleted

## 2017-09-07 ENCOUNTER — Encounter: Payer: Self-pay | Admitting: *Deleted

## 2017-09-07 ENCOUNTER — Ambulatory Visit (INDEPENDENT_AMBULATORY_CARE_PROVIDER_SITE_OTHER): Payer: Medicaid Other | Admitting: Physician Assistant

## 2017-09-07 ENCOUNTER — Ambulatory Visit (HOSPITAL_COMMUNITY)
Admission: RE | Admit: 2017-09-07 | Discharge: 2017-09-07 | Disposition: A | Discharge status: Home / Self Care | Payer: Medicaid Other | Source: Ambulatory Visit | Attending: Vascular Surgery | Admitting: Vascular Surgery

## 2017-09-07 ENCOUNTER — Encounter: Payer: Self-pay | Admitting: Physical Medicine & Rehabilitation

## 2017-09-07 ENCOUNTER — Other Ambulatory Visit: Payer: Self-pay

## 2017-09-07 ENCOUNTER — Encounter: Payer: Medicaid Other | Attending: Physical Medicine & Rehabilitation | Admitting: Physical Medicine & Rehabilitation

## 2017-09-07 VITALS — BP 153/85 | HR 99 | Ht 66.0 in | Wt 167.0 lb

## 2017-09-07 VITALS — BP 171/84 | HR 98 | Resp 18 | Ht 66.0 in | Wt 167.0 lb

## 2017-09-07 DIAGNOSIS — M79602 Pain in left arm: Secondary | ICD-10-CM | POA: Insufficient documentation

## 2017-09-07 DIAGNOSIS — N184 Chronic kidney disease, stage 4 (severe): Secondary | ICD-10-CM

## 2017-09-07 DIAGNOSIS — E1143 Type 2 diabetes mellitus with diabetic autonomic (poly)neuropathy: Secondary | ICD-10-CM | POA: Diagnosis not present

## 2017-09-07 DIAGNOSIS — G479 Sleep disorder, unspecified: Secondary | ICD-10-CM | POA: Diagnosis not present

## 2017-09-07 DIAGNOSIS — Z87891 Personal history of nicotine dependence: Secondary | ICD-10-CM | POA: Diagnosis not present

## 2017-09-07 DIAGNOSIS — N186 End stage renal disease: Secondary | ICD-10-CM | POA: Diagnosis not present

## 2017-09-07 DIAGNOSIS — E1142 Type 2 diabetes mellitus with diabetic polyneuropathy: Secondary | ICD-10-CM | POA: Insufficient documentation

## 2017-09-07 DIAGNOSIS — R531 Weakness: Secondary | ICD-10-CM | POA: Insufficient documentation

## 2017-09-07 DIAGNOSIS — Z992 Dependence on renal dialysis: Secondary | ICD-10-CM

## 2017-09-07 DIAGNOSIS — T82510A Breakdown (mechanical) of surgically created arteriovenous fistula, initial encounter: Secondary | ICD-10-CM

## 2017-09-07 DIAGNOSIS — M7989 Other specified soft tissue disorders: Secondary | ICD-10-CM | POA: Insufficient documentation

## 2017-09-07 DIAGNOSIS — R269 Unspecified abnormalities of gait and mobility: Secondary | ICD-10-CM | POA: Diagnosis not present

## 2017-09-07 DIAGNOSIS — I2510 Atherosclerotic heart disease of native coronary artery without angina pectoris: Secondary | ICD-10-CM | POA: Diagnosis not present

## 2017-09-07 DIAGNOSIS — K219 Gastro-esophageal reflux disease without esophagitis: Secondary | ICD-10-CM | POA: Insufficient documentation

## 2017-09-07 DIAGNOSIS — J45909 Unspecified asthma, uncomplicated: Secondary | ICD-10-CM | POA: Diagnosis not present

## 2017-09-07 DIAGNOSIS — I120 Hypertensive chronic kidney disease with stage 5 chronic kidney disease or end stage renal disease: Secondary | ICD-10-CM | POA: Insufficient documentation

## 2017-09-07 DIAGNOSIS — Z86718 Personal history of other venous thrombosis and embolism: Secondary | ICD-10-CM | POA: Diagnosis not present

## 2017-09-07 DIAGNOSIS — E1122 Type 2 diabetes mellitus with diabetic chronic kidney disease: Secondary | ICD-10-CM | POA: Diagnosis present

## 2017-09-07 MED ORDER — CAPSAICIN 0.075 % EX CREA: 1.0000 "application " | TOPICAL_CREAM | Freq: Two times a day (BID) | CUTANEOUS | 0 refills | Status: DC

## 2017-09-07 MED ORDER — METHOCARBAMOL 500 MG PO TABS: 500.0000 mg | ORAL_TABLET | Freq: Two times a day (BID) | ORAL | 1 refills | Status: DC | PRN

## 2017-09-07 MED ORDER — AMITRIPTYLINE HCL 25 MG PO TABS: 25.0000 mg | ORAL_TABLET | Freq: Every day | ORAL | 1 refills | Status: DC

## 2017-09-07 MED ORDER — PREGABALIN 50 MG PO CAPS: 50.0000 mg | ORAL_CAPSULE | Freq: Every day | ORAL | 1 refills | Status: DC

## 2017-09-07 MED FILL — AMITRIPTYLINE HCL 25 MG TAB: 25 | 30 days supply | Qty: 30 | Fill #0

## 2017-09-07 MED FILL — METHOCARBAMOL 500 MG TABS: 500 | 30 days supply | Qty: 60 | Fill #0

## 2017-09-07 NOTE — Progress Notes (Signed)
HISTORY AND PHYSICAL     CC:  Pain in right arm Requesting Provider:  [name], MD  HPI: This is a 47 y.o. female who is now ESRD on dialysis T/T/S at the [facility] location.  She underwent creation of a left BC AVF on 02/01/17 by Dr. [name].  In June, she underwent a fistulogram by Dr. [name], which revealed a patent fistula with small branches and was 1.5cm deep.    She started HD in July and tells me that after a couple of hours on the machine, her left arm from the elbow down becomes painful and her hand becomes numb and cold.  The numbness starts in her left 4th and 5th fingers and progresses to her hand and arm.  She states they also have a difficult time cannulating the fistula and almost always have to manipulate the needles to get them to work.  They have created a couple of buttonholes to help with cannulation.  She states that the pain becomes immediately better once she is off the machine.    She states that she is going to be evaluated for PD in the near future but even if she is a candidate that it will be weeks before that is initiated.    Pt is on Plavix/aspirin.  She has hx of mid LAD drug eluding stenting on 08/05/16 by Dr. Gwenlyn Found. The pt is on a statin for cholesterol management.  She is on insulin for diabetes.  She is on a beta blocker for HTN.    Past Medical History:  Diagnosis Date  . Anemia   . Asthma   . CAD (coronary artery disease)    DES to mid LAD July 2018, residual moderate RCA disease  . Cataract   . CKD (chronic kidney disease) stage 4, GFR 15-29 ml/min (HCC)   . DVT (deep venous thrombosis) ([place])    1996 during pregnancy, 2015 left leg  . Essential hypertension 12/11/2015  . Gastroparesis   . GERD (gastroesophageal reflux disease)   . Hidradenitis   . Migraine   . Neuropathy   . Type 2 diabetes mellitus ([place])   . Type II diabetes mellitus ([place])     Past Surgical History:  Procedure Laterality Date  . A/V FISTULAGRAM N/A 06/22/2017   Procedure: A/V FISTULAGRAM - left arm;  Surgeon: [name], MD;  Location: [place] [facility];  Service: Cardiovascular;  Laterality: N/A;  . ABDOMINOPLASTY    . APPENDECTOMY  1995  . AV FISTULA PLACEMENT Left 02/01/2017   Procedure: ARTERIOVENOUS BRACHIOCEPHALIC (AV) FISTULA CREATION;  Surgeon: [name], MD;  Location: [place];  Service: Vascular;  Laterality: Left;  . CHOLECYSTECTOMY  1993  . CORONARY ANGIOPLASTY WITH STENT PLACEMENT    . CORONARY STENT INTERVENTION N/A 08/05/2016   Procedure: Coronary Stent Intervention;  Surgeon: [name], MD;  Location: [place] [facility];  Service: Cardiovascular;  Laterality: N/A;  . EXPLORATORY LAPAROTOMY  08/14/2005   lysis of adhesions, drainage of tubo-ovarian abscess  . HYDRADENITIS EXCISION  02/08/2011   Procedure: EXCISION HYDRADENITIS GROIN;  Surgeon: [name], MD;  Location: [place];  Service: General;  Laterality: N/A;  Excisioin of Hidradenitis Left groin  . INGUINAL HIDRADENITIS EXCISION  07/06/2010   bilateral  . LEFT HEART CATH AND CORONARY ANGIOGRAPHY N/A 08/05/2016   Procedure: Left Heart Cath and Coronary Angiography;  Surgeon: [name], MD;  Location: [facility];  Service: Cardiovascular;  Laterality: N/A;  . REDUCTION MAMMAPLASTY  2002  . TONSILLECTOMY    . TUBAL LIGATION  1996  . VAGINAL HYSTERECTOMY  08/04/2005   and cysto    Allergies  Allergen Reactions  . Penicillins Anaphylaxis, Itching, Swelling, Rash and Other (See Comments)    Swelling of throat & whole mouth  Has patient had a PCN reaction causing immediate rash, facial/tongue/throat swelling, SOB or lightheadedness with hypotension: Yes Has patient had a PCN reaction causing severe rash involving mucus membranes or skin necrosis: No Has patient had a PCN reaction that required hospitalization: No Has patient had a PCN reaction occurring within the last 10 years: No If all of the above answers are  "NO", then may proceed with Cephalosporin use.  [name] Lisinopril Cough    Current Outpatient Medications  Medication Sig Dispense Refill  . albuterol (PROVENTIL HFA;VENTOLIN HFA) 108 (90 BASE) MCG/ACT inhaler Inhale 1-2 puffs into the lungs every 6 (six) hours as needed for wheezing or shortness of breath.     [name] amitriptyline (ELAVIL) 25 MG tablet Take 1 tablet (25 mg total) by mouth at bedtime. 30 tablet 1  . aspirin 81 MG chewable tablet Chew 1 tablet (81 mg total) by mouth daily. 30 tablet 0  . capsicum (ZOSTRIX) 0.075 % topical cream Apply 1 application topically 2 (two) times daily. 28.3 g 0  . Cholecalciferol (VITAMIN D-3 PO) Take 1 capsule by mouth daily.    . clopidogrel (PLAVIX) 75 MG tablet Take 1 tablet (75 mg total) by mouth daily with breakfast. 90 tablet 2  . DULoxetine (CYMBALTA) 60 MG capsule Take 1 capsule (60 mg total) by mouth daily. 30 capsule 3  . Ferrous Sulfate (IRON) 325 (65 Fe) MG TABS Take 325 mg by mouth daily. 30 each 3  . Fluticasone-Salmeterol (ADVAIR) 250-50 MCG/DOSE AEPB Inhale 1 puff into the lungs 2 (two) times daily. 60 each 3  . Insulin Glargine (TOUJEO SOLOSTAR) 300 UNIT/ML SOPN Inject 15 Units into the skin 2 (two) times daily. 12 pen 0  . insulin regular (HUMULIN R) 100 units/mL injection Use with meals. CBG 121-150: 2 u; CBG 151-200: 3 u; CBG 201-250: 5 u; CBG 251-300: 8 u; CBG 301-350: 11; CBG 351-400: 15 u 30 mL 5  . lovastatin (MEVACOR) 20 MG tablet Take 20 mg by mouth at bedtime.     . methocarbamol (ROBAXIN) 500 MG tablet Take 1 tablet (500 mg total) by mouth 2 (two) times daily as needed for muscle spasms. 60 tablet 1  . metoCLOPramide (REGLAN) 5 MG tablet TAKE 1 TABLET BY MOUTH 4 TIMES DAILY BEFORE MEALS AND AT BEDTIME. 90 tablet 0  . metoprolol succinate (TOPROL-XL) 50 MG 24 hr tablet Take 50 mg by mouth 2 (two) times daily.  6  . mirtazapine (REMERON SOL-TAB) 15 MG disintegrating tablet TAKE 1 TABLET BY MOUTH AT BEDTIME. 30 tablet 0  . montelukast  (SINGULAIR) 10 MG tablet Take 10 mg by mouth daily as needed (allergies).     . multivitamin (RENA-VIT) TABS tablet Take 1 tablet by mouth at bedtime.  0  . nitroGLYCERIN (NITROSTAT) 0.4 MG SL tablet Place 1 tablet (0.4 mg total) under the tongue every 5 (five) minutes as needed. (Patient taking differently: Place 0.4 mg under the tongue every 5 (five) minutes as needed for chest pain. ) 25 tablet 3  . pantoprazole (PROTONIX) 40 MG tablet TAKE 1 TABLET BY MOUTH DAILY. 30 tablet 2  . pregabalin (LYRICA) 50 MG capsule Take 1 capsule (50  mg total) by mouth daily. 30 capsule 1  . senna-docusate (SENOKOT-S) 8.6-50 MG tablet Take 1 tablet by mouth 2 (two) times daily. (Patient taking differently: Take 1 tablet by mouth 2 (two) times daily as needed for mild constipation. )    . trimethoprim-polymyxin b (POLYTRIM) ophthalmic solution INSTILL ONE DROP INTO BOTH EYES 4 TIMES A DAY FOR 2 DAYS AFTER EACH MONTHLY EYE INJECTION  12   No current facility-administered medications for this visit.     Family History  Problem Relation Age of Onset  . Hypertension Father   . Hypertension Sister   . Hypertension Brother   . Breast cancer Cousin        x2    Social History   Socioeconomic History  . Marital status: Single    Spouse name: Not on file  . Number of children: 3  . Years of education: 20  . Highest education level: Not on file  Occupational History  . Occupation: Curator  Social Needs  . Financial resource strain: Not on file  . Food insecurity:    Worry: Not on file    Inability: Not on file  . Transportation needs:    Medical: Not on file    Non-medical: Not on file  Tobacco Use  . Smoking status: Former Smoker    Years: 20.00    Types: Cigarettes    Last attempt to quit: 11/2016    Years since quitting: 0.8  . Smokeless tobacco: Never Used  . Tobacco comment: Smokes on rare occasions  Substance and Sexual Activity  . Alcohol use: No    Alcohol/week: 0.0 standard  drinks  . Drug use: No  . Sexual activity: Not on file  Lifestyle  . Physical activity:    Days per week: Not on file    Minutes per session: Not on file  . Stress: Not on file  Relationships  . Social connections:    Talks on phone: Not on file    Gets together: Not on file    Attends religious service: Not on file    Active member of club or organization: Not on file    Attends meetings of clubs or organizations: Not on file    Relationship status: Not on file  . Intimate partner violence:    Fear of current or ex partner: Not on file    Emotionally abused: Not on file    Physically abused: Not on file    Forced sexual activity: Not on file  Other Topics Concern  . Not on file  Social History Narrative   Lives with daughter in a 2 story home.  Has 3 children.  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  Education: college.      REVIEW OF SYSTEMS:   [X]  denotes positive finding, [ ]  denotes negative finding Cardiac  Comments:  Chest pain or chest pressure:    Shortness of breath upon exertion:    Short of breath when lying flat:    Irregular heart rhythm:        Vascular    Pain in calf, thigh, or hip brought on by ambulation:    Pain in feet at night that wakes you up from your sleep:  x   Blood clot in your veins:    Leg swelling:         Pulmonary    Oxygen at home:    Productive cough:     Wheezing:  Neurologic    Sudden weakness in arms or legs:     Sudden numbness in arms or legs:     Sudden onset of difficulty speaking or slurred speech:    Temporary loss of vision in one eye:     Problems with dizziness:         Gastrointestinal    Blood in stool:     Vomited blood:         Genitourinary    Burning when urinating:     Blood in urine:        Psychiatric    Major depression:         Hematologic    Bleeding problems:    Problems with blood clotting too easily:        Skin    Rashes or ulcers:          Constitutional    Fever or chills:      PHYSICAL EXAMINATION:  Vitals:   09/07/17 1354 09/07/17 1355  BP: (!) 174/89 (!) 171/84  Pulse: 98   Resp: 18   SpO2: 99%    Vitals:   09/07/17 1354  Weight: 167 lb (75.8 kg)  Height: 5\' 6"  (1.676 m)   Body mass index is 26.95 kg/m.  General:  WDWN in NAD; vital signs documented above Gait: Not observed HENT: WNL, normocephalic Pulmonary: normal non-labored breathing , without Rales, rhonchi,  wheezing Cardiac: regular HR, without  Murmurs with carotid bruits bilaterally Abdomen: soft, NT, no masses Skin: without rashes; tattoos present bilateral chest below clavicles. Vascular Exam/Pulses:  Right Left  Radial 2+ (normal) 2+ (normal)  Ulnar Unable to palpate  Unable to palpate   PT Unable to palpate  Unable to palpate    Extremities: without ischemic changes, without Gangrene , without cellulitis; without open wounds; fistula is easy to palpate but is deeper proximally; there is a +thrill/bruit present.  Two buttonholes present on fistula but no ulceration.   Musculoskeletal: no muscle wasting or atrophy  Neurologic: A&O X 3;  No focal weakness or paresthesias are detected Psychiatric:  The pt has Normal affect.   Non-Invasive Vascular Imaging:   Dialysis steal study 09/07/17: Left radial with AVF compression 169 mmHg  -left radial pressure and peak systolic velocity increase with AVF compression  Carotid duplex 07/13/17: Final Interpretation: Right Carotid: Velocities in the right ICA are consistent with a 1-39% stenosis.  Left Carotid: Velocities in the left ICA are consistent with a 40-59% stenosis.  Vertebrals: Bilateral vertebral arteries demonstrate antegrade flow. Subclavians: Normal flow hemodynamics were seen in bilateral subclavian       arteries.  Pt meds includes: Statin:  Yes.   Beta Blocker:  Yes.   Aspirin:  Yes.   ACEI:  No. ARB:  No. CCB use:  No Other Antiplatelet/Anticoagulant:  Yes  Plavix   ASSESSMENT/PLAN:: 47 y.o. female with ESRD on dialysis T/T/S and s/p left BC AVF 02/01/17 by Dr. [name] presents today with left arm/hand pain on dialysis & difficult cannulation.   -pt with arm pain/numbness and coolness from elbow down to hand after about 2 hours on dialysis.  She states this is immediately better after she is off dialysis.  She does not have any motor impairment of her left hand.  She states she does have neuropathy in her feet and hands.  Her fistula is also difficult to cannulate.  Dr. [name] did a fistulogram in June and her fistula was patent but ~ 1.5cm deep and recommended  if trouble cannulating, she may need superficialization.   -I discussed pt with Dr. [name].  If pt can tolerate the sx, she can continue to use the fistula, however, if she cannot, she would need new access in her right arm.  I discussed this with the pt and she does not want to proceed with new access in the right arm.  However, when discussing difficulty cannulation, we discussed superficialization and that it would make it easier to cannulate.  She understands that if she proceeds with this, she may need a tunneled dialysis catheter until wounds heal.  She would like to proceed with superficialization so this will be scheduled with Dr. [name] on a Monday ( non dialysis day).   -she is going to be evaluated for PD but even if she is a candidate, she states this will be weeks away and wants to proceed with superficialization.  -she does have tattoos bilaterally below her clavicles-will most likely be able to tunnel the catheter below the tattoo if she does need it.     Leontine Locket, PA-C [facility] [phone]  Clinic MD:   [name]

## 2017-09-07 NOTE — Progress Notes (Signed)
Subjective:    Patient ID: Susan Horton, female    DOB: 06/28/1970, 47 y.o.   MRN: [id number]  HPI 47 y/o female with pmh of DM with neuropathy, ESRD on HD, CAD, asthma presents with diabetic neuropathy.  Started ~2000, progressively worse in 2017.  B/l feet up to knees and hands.  Denies inciting event.  Burning/tingling.  Constant. Denies alleviating factors.  Ambulation exacerbates pain.  Associated numbness and weakness. [place], hot/cold do not help.  Denies falls in last year. Pain limits ADLs.  Pain Inventory Average Pain 10 Pain Right Now 9 My pain is burning, tingling and aching  In the last 24 hours, has pain interfered with the following? General activity 8 Relation with others 10 Enjoyment of life 10 What TIME of day is your pain at its worst? all Sleep (in general) Good  Pain is worse with: walking, sitting, inactivity and standing Pain improves with: none Relief from Meds: no meds  Mobility use a cane ability to climb steps?  yes do you drive?  yes  Function disabled: date disabled July 2017 I need assistance with the following:  household duties  Neuro/Psych No problems in this area  Prior Studies Any changes since last visit?  no  Physicians involved in your care Any changes since last visit?  no   Family History  Problem Relation Age of Onset  . Hypertension Father   . Hypertension Sister   . Hypertension Brother   . Breast cancer Cousin        x2   Social History   Socioeconomic History  . Marital status: Single    Spouse name: Not on file  . Number of children: 3  . Years of education: 10  . Highest education level: Not on file  Occupational History  . Occupation: Curator  Social Needs  . Financial resource strain: Not on file  . Food insecurity:    Worry: Not on file    Inability: Not on file  . Transportation needs:    Medical: Not on file    Non-medical: Not on file  Tobacco Use  . Smoking status: Former Smoker   Years: 20.00    Types: Cigarettes    Last attempt to quit: 11/2016    Years since quitting: 0.8  . Smokeless tobacco: Never Used  . Tobacco comment: Smokes on rare occasions  Substance and Sexual Activity  . Alcohol use: No    Alcohol/week: 0.0 standard drinks  . Drug use: No  . Sexual activity: Not on file  Lifestyle  . Physical activity:    Days per week: Not on file    Minutes per session: Not on file  . Stress: Not on file  Relationships  . Social connections:    Talks on phone: Not on file    Gets together: Not on file    Attends religious service: Not on file    Active member of club or organization: Not on file    Attends meetings of clubs or organizations: Not on file    Relationship status: Not on file  Other Topics Concern  . Not on file  Social History Narrative   Lives with daughter in a 2 story home.  Has 3 children.  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  Education: college.    Past Surgical History:  Procedure Laterality Date  . A/V FISTULAGRAM N/A 06/22/2017   Procedure: A/V FISTULAGRAM - left arm;  Surgeon: Servando Snare  Harrell Gave, MD;  Location: [place] [facility];  Service: Cardiovascular;  Laterality: N/A;  . ABDOMINOPLASTY    . APPENDECTOMY  1995  . AV FISTULA PLACEMENT Left 02/01/2017   Procedure: ARTERIOVENOUS BRACHIOCEPHALIC (AV) FISTULA CREATION;  Surgeon: [name], MD;  Location: [place];  Service: Vascular;  Laterality: Left;  . CHOLECYSTECTOMY  1993  . CORONARY ANGIOPLASTY WITH STENT PLACEMENT    . CORONARY STENT INTERVENTION N/A 08/05/2016   Procedure: Coronary Stent Intervention;  Surgeon: [name], MD;  Location: [place] [facility];  Service: Cardiovascular;  Laterality: N/A;  . EXPLORATORY LAPAROTOMY  08/14/2005   lysis of adhesions, drainage of tubo-ovarian abscess  . HYDRADENITIS EXCISION  02/08/2011   Procedure: EXCISION HYDRADENITIS GROIN;  Surgeon: [name], MD;  Location: [place];  Service: General;  Laterality: N/A;  Excisioin of Hidradenitis Left groin  . INGUINAL HIDRADENITIS EXCISION  07/06/2010   bilateral  . LEFT HEART CATH AND CORONARY ANGIOGRAPHY N/A 08/05/2016   Procedure: Left Heart Cath and Coronary Angiography;  Surgeon: [name], MD;  Location: [place] [facility];  Service: Cardiovascular;  Laterality: N/A;  . REDUCTION MAMMAPLASTY  2002  . TONSILLECTOMY    . TUBAL LIGATION  1996  . VAGINAL HYSTERECTOMY  08/04/2005   and cysto   Past Medical History:  Diagnosis Date  . Anemia   . Asthma   . CAD (coronary artery disease)    DES to mid LAD July 2018, residual moderate RCA disease  . Cataract   . CKD (chronic kidney disease) stage 4, GFR 15-29 ml/min (HCC)   . DVT (deep venous thrombosis) ([place])    1996 during pregnancy, 2015 left leg  . Essential hypertension 12/11/2015  . Gastroparesis   . GERD (gastroesophageal reflux disease)   . Hidradenitis   . Migraine   . Neuropathy   . Type 2 diabetes mellitus ([place])   . Type II diabetes mellitus (HCC)    BP (!) 153/85   Pulse 99   Ht 5\' 6"  (1.676 m)   Wt 167 lb (75.8 kg)   BMI 26.95 kg/m   Opioid Risk Score:   Fall Risk Score:  `1  Depression screen PHQ 2/9  Depression screen [facility] 2/9 09/07/2017 06/10/2017 03/10/2016 01/21/2016 12/11/2015 11/25/2015  Decreased Interest 0 1 2 2 1 1   Down, Depressed, Hopeless 0 1 1 1 1 2   PHQ - 2 Score 0 2 3 3 2 3   Altered sleeping 1 3 1 3 2 3   Tired, decreased energy 2 3 2 2 3 3   Change in appetite 0 3 1 2 3 3   Feeling bad or failure about yourself  0 1 1 1  0 0  Trouble concentrating 0 1 2 0 1 1  Moving slowly or fidgety/restless 0 0 0 0 1 1  Suicidal thoughts 0 0 0 0 0 0  PHQ-9 Score 3 13 10 11 12 14   Difficult doing work/chores Somewhat difficult - - - - -     Review of Systems  Constitutional: Negative.   HENT: Negative.   Eyes: Negative.   Respiratory: Negative.   Cardiovascular: Negative.   Gastrointestinal: Negative.   Endocrine:         Diabetic   Genitourinary: Negative.   Musculoskeletal: Positive for gait problem and myalgias.  Skin: Negative.   Allergic/Immunologic: Negative.   Neurological: Positive for weakness and numbness.  Hematological: Negative.   Psychiatric/Behavioral: Negative.   All other systems reviewed and  are negative.     Objective:   Physical Exam Gen: NAD. Vital signs reviewed HENT: Normocephalic, Atraumatic Eyes: EOMI. No discharge.  Cardio: RRR. No JVD. Pulm: B/l clear to auscultation.  Effort normal Abd: Nondistended, BS+ MSK:  Gait antalgic.   TTP b/l LE.    No edema.  Neuro: CN II-XII grossly intact.    Sensation diminished to light touch in distal b/l knees with hypersensitivity  Strength  4/5 in all LE myotomes (pain inhibition) Skin: Warm and Dry. Intact    Assessment & Plan:  47 y/o female with pmh of DM with neuropathy, ESRD on HD, CAD, asthma presents with diabetic neuropathy.    1. Diabetic polyneuropathy  Labs reviewed  Referral information reviewed  Lidoderm, Gabapentin ineffective  Will order TENS  Lidoderm  Cont Cymbalta  Will order Robaxin 500 BID PRN - Flexaril ineffective  Will order Lyrica 50 daily  Will order Capsacin cream  Will consider baths   Will consider compound medications  Encouraged follow up for diabetic shoes  2. Gait abnormality  Encouraged use of cane  3. Sleep disturbance  No benefit with Pamelor, will d/c  Will order Elavil 25  4. Morbid Obesity  Cont to follow up with dietitian

## 2017-09-09 ENCOUNTER — Encounter: Payer: Self-pay | Admitting: Nephrology

## 2017-09-09 ENCOUNTER — Encounter (HOSPITAL_COMMUNITY): Payer: Self-pay | Admitting: *Deleted

## 2017-09-09 ENCOUNTER — Ambulatory Visit: Payer: Medicaid Other | Admitting: Pharmacist

## 2017-09-09 DIAGNOSIS — E785 Hyperlipidemia, unspecified: Secondary | ICD-10-CM | POA: Diagnosis not present

## 2017-09-09 NOTE — Progress Notes (Signed)
Patient ID: ZUHA DEJONGE                 DOB: [birth year]                    MRN: [id number]     HPI: Susan Horton is a 47 y.o. female patient referred to lipid clinic by Dr Gwenlyn Found. PMH is significant for CAD s/p PCI and DES, hypertension, migraine, HF, diabetes and CKD-4.  Current Medications:  Lovastatin 20mg  daily  Intolerances:  Atorvastatin 40mg  daily -  Pravastatin 20mg  daily - insurance coverage  LDL goal: 70mg /dL  Diet: low carb, ,ow fat and increased protein  Exercise: none at this time; pain management for leg and feet neuropathy  Family History: grandmother stroke and MI; father diabetes, HTN,   Social History: former smoker, denies alcohol  Labs: 07/21/2017: CHO 248; TG 248; HDL 33; LDL-c 165 (lovastatin 20mg  daily)  Past Medical History:  Diagnosis Date  . Anemia   . Asthma   . CAD (coronary artery disease)    DES to mid LAD July 2018, residual moderate RCA disease  . Cataract   . CKD (chronic kidney disease) stage 4, GFR 15-29 ml/min (HCC)    Dialysis T/Th/Sa  . DVT (deep venous thrombosis) ([place])    1996 during pregnancy, 2015 left leg  . Essential hypertension 12/11/2015  . Gastroparesis   . GERD (gastroesophageal reflux disease)   . Hidradenitis   . Migraine   . Neuropathy   . Peripheral vascular disease ([place])    blood clot in leg  . Type 2 diabetes mellitus ([place])   . Type II diabetes mellitus ([place])     Current Outpatient Medications on File Prior to Visit  Medication Sig Dispense Refill  . albuterol (PROVENTIL HFA;VENTOLIN HFA) 108 (90 BASE) MCG/ACT inhaler Inhale 1-2 puffs into the lungs every 6 (six) hours as needed for wheezing or shortness of breath.     [name] amitriptyline (ELAVIL) 25 MG tablet Take 1 tablet (25 mg total) by mouth at bedtime. (Patient not taking: Reported on 09/08/2017) 30 tablet 1  . aspirin 81 MG chewable tablet Chew 1 tablet (81 mg total) by mouth daily. 30 tablet 0  . capsicum (ZOSTRIX) 0.075 % topical cream Apply 1 application  topically 2 (two) times daily. 28.3 g 0  . clopidogrel (PLAVIX) 75 MG tablet Take 1 tablet (75 mg total) by mouth daily with breakfast. 90 tablet 2  . DULoxetine (CYMBALTA) 60 MG capsule Take 1 capsule (60 mg total) by mouth daily. (Patient not taking: Reported on 09/08/2017) 30 capsule 3  . Ferrous Sulfate (IRON) 325 (65 Fe) MG TABS Take 325 mg by mouth daily. (Patient not taking: Reported on 09/08/2017) 30 each 3  . Fluticasone-Salmeterol (ADVAIR) 250-50 MCG/DOSE AEPB Inhale 1 puff into the lungs 2 (two) times daily. (Patient taking differently: Inhale 1 puff into the lungs daily as needed (shortness of breath). ) 60 each 3  . Insulin Glargine (TOUJEO SOLOSTAR) 300 UNIT/ML SOPN Inject 15 Units into the skin 2 (two) times daily. (Patient taking differently: Inject 25 Units into the skin 2 (two) times daily. ) 12 pen 0  . insulin regular (HUMULIN R) 100 units/mL injection Use with meals. CBG 121-150: 2 u; CBG 151-200: 3 u; CBG 201-250: 5 u; CBG 251-300: 8 u; CBG 301-350: 11; CBG 351-400: 15 u (Patient taking differently: Inject 2-15 Units into the skin See admin instructions. Use with meals. CBG 121-150: 2 u; CBG 151-200: 3  u; CBG 201-250: 5 u; CBG 251-300: 8 u; CBG 301-350: 11; CBG 351-400: 15 u) 30 mL 5  . lovastatin (MEVACOR) 20 MG tablet Take 20 mg by mouth at bedtime.     . methocarbamol (ROBAXIN) 500 MG tablet Take 1 tablet (500 mg total) by mouth 2 (two) times daily as needed for muscle spasms. (Patient not taking: Reported on 09/08/2017) 60 tablet 1  . metoCLOPramide (REGLAN) 5 MG tablet TAKE 1 TABLET BY MOUTH 4 TIMES DAILY BEFORE MEALS AND AT BEDTIME. (Patient taking differently: Take 5 mg by mouth 4 (four) times daily -  before meals and at bedtime. ) 90 tablet 0  . metoprolol succinate (TOPROL-XL) 50 MG 24 hr tablet Take 25 mg by mouth See admin instructions. Take 25mg  by mouth twice daily on Monday, Wednesday, Friday and Sunday  6  . mirtazapine (REMERON SOL-TAB) 15 MG disintegrating tablet TAKE  1 TABLET BY MOUTH AT BEDTIME. (Patient not taking: Reported on 09/08/2017) 30 tablet 0  . montelukast (SINGULAIR) 10 MG tablet Take 10 mg by mouth daily as needed (allergies).     . multivitamin (RENA-VIT) TABS tablet Take 1 tablet by mouth at bedtime.  0  . nitroGLYCERIN (NITROSTAT) 0.4 MG SL tablet Place 1 tablet (0.4 mg total) under the tongue every 5 (five) minutes as needed. (Patient taking differently: Place 0.4 mg under the tongue every 5 (five) minutes as needed for chest pain. ) 25 tablet 3  . pantoprazole (PROTONIX) 40 MG tablet TAKE 1 TABLET BY MOUTH DAILY. 30 tablet 2  . pregabalin (LYRICA) 50 MG capsule Take 1 capsule (50 mg total) by mouth daily. 30 capsule 1  . senna-docusate (SENOKOT-S) 8.6-50 MG tablet Take 1 tablet by mouth 2 (two) times daily. (Patient taking differently: Take 1 tablet by mouth daily as needed for moderate constipation. )    . trimethoprim-polymyxin b (POLYTRIM) ophthalmic solution INSTILL ONE DROP INTO BOTH EYES 4 TIMES A DAY FOR 2 DAYS AFTER EACH MONTHLY EYE INJECTION  12   No current facility-administered medications on file prior to visit.     Allergies  Allergen Reactions  . Penicillins Anaphylaxis, Itching, Swelling, Rash and Other (See Comments)    Swelling of throat & whole mouth  Has patient had a PCN reaction causing immediate rash, facial/tongue/throat swelling, SOB or lightheadedness with hypotension: Yes Has patient had a PCN reaction causing severe rash involving mucus membranes or skin necrosis: No Has patient had a PCN reaction that required hospitalization: No Has patient had a PCN reaction occurring within the last 10 years: No If all of the above answers are "NO", then may proceed with Cephalosporin use.  [name] Lisinopril Cough    Dyslipidemia  LDL-c remains above goal for secondary prevention while patient on highest tolerated statin. Appropriate to initiate PCSK9i therapy as soon as possible.  We discussed Repatha/ Praluent MOA, storage,  administration, common side effects, administration, and prior authorization process during this visit. 1st Repatha provided as a sample during this visit and patient was able to self-administer while in clinic. (Repatha 140mg ; Lot [id number]  Exp 11/2019)  Will submit paperwork for PCSK9i prior authorization and follow up as needed. Plan to repeat fasting lipid panel around the 5th dose of new medication.       Kailea Dannemiller Rodriguez-Guzman PharmD, BCPS, [place] [id number] 09/13/2017 8:05 PM

## 2017-09-09 NOTE — Patient Instructions (Addendum)
[facility] Clinic (pharmacist) [facility] Maners/Kristin [phone]  *Continue all medication as previously prescribed* *Will START paperwork for Repatha 140mg *   Cholesterol Cholesterol is a fat. Your body needs a small amount of cholesterol. Cholesterol (plaque) may build up in your blood vessels (arteries). That makes you more likely to have a heart attack or stroke. You cannot feel your cholesterol level. Having a blood test is the only way to find out if your level is high. Keep your test results. Work with your doctor to keep your cholesterol at a good level. What do the results mean?  Total cholesterol is how much cholesterol is in your blood.  LDL is bad cholesterol. This is the type that can build up. Try to have low LDL.  HDL is good cholesterol. It cleans your blood vessels and carries LDL away. Try to have high HDL.  Triglycerides are fat that the body can store or burn for energy. What are good levels of cholesterol?  Total cholesterol below 200.  LDL below 100 is good for people who have health risks. LDL below 70 is good for people who have very high risks.  HDL above 40 is good. It is best to have HDL of 60 or higher.  Triglycerides below 150. How can I lower my cholesterol? Diet Follow your diet program as told by your doctor.  Choose fish, white meat chicken, or [place] that is roasted or baked. Try not to eat red meat, fried foods, sausage, or lunch meats.  Eat lots of fresh fruits and vegetables.  Choose whole grains, beans, pasta, potatoes, and cereals.  Choose olive oil, corn oil, or canola oil. Only use small amounts.  Try not to eat butter, mayonnaise, shortening, or palm kernel oils.  Try not to eat foods with trans fats.  Choose low-fat or nonfat dairy foods. ? Drink skim or nonfat milk. ? Eat low-fat or nonfat yogurt and cheeses. ? Try not to drink whole milk or cream. ? Try not to eat ice cream, egg yolks, or full-fat cheeses.  Healthy desserts include  angel food cake, ginger snaps, animal crackers, hard candy, popsicles, and low-fat or nonfat frozen yogurt. Try not to eat pastries, cakes, pies, and cookies.  Exercise Follow your exercise program as told by your doctor.  Be more active. Try gardening, walking, and taking the stairs.  Ask your doctor about ways that you can be more active.  Medicine  Take over-the-counter and prescription medicines only as told by your doctor. This information is not intended to replace advice given to you by your health care provider. Make sure you discuss any questions you have with your health care provider. Document Released: 03/26/2008 Document Revised: 07/30/2015 Document Reviewed: 07/10/2015 Elsevier Interactive Patient Education  [employer].

## 2017-09-13 ENCOUNTER — Other Ambulatory Visit: Payer: Self-pay

## 2017-09-13 ENCOUNTER — Encounter (HOSPITAL_COMMUNITY): Payer: Self-pay | Admitting: *Deleted

## 2017-09-13 NOTE — Progress Notes (Signed)
Spoke with pt for pre-op call. Pt has hx of CAD with stents. Pt's cardiologist is Dr. Gwenlyn Found. She denies any recent chest pain or sob. Pt was instructed to stop Plavix 5 days prior to surgery. Last dose was 09/09/17.Pt is a type 2 Diabetic. Last A1C was 9.1 on 06/13/17. She states her fasting blood sugar is usually between 160-220. Pt instructed to take 1/2 of her regular dose of Toujeo Insulin this evening and in the AM (she'll take 12 units). Instructed pt to check her blood sugar when she gets up in the AM and every 2 hours until she leaves for the hospital. If blood sugar is >220 take 1/2 of usual correction dose of Humulin R insulin. If blood sugar is 70 or below, treat with 1/2 cup of clear juice (apple or cranberry) and recheck blood sugar 15 minutes after drinking juice. If blood sugar continues to be 70 or below, call the Short Stay department and ask to speak to a nurse.

## 2017-09-13 NOTE — Assessment & Plan Note (Signed)
  LDL-c remains above goal for secondary prevention while patient on highest tolerated statin. Appropriate to initiate PCSK9i therapy as soon as possible.  We discussed Repatha/ Praluent MOA, storage, administration, common side effects, administration, and prior authorization process during this visit. 1st Repatha provided as a sample during this visit and patient was able to self-administer while in clinic. (Repatha 140mg ; Lot [id number]  Exp 11/2019)  Will submit paperwork for PCSK9i prior authorization and follow up as needed. Plan to repeat fasting lipid panel around the 5th dose of new medication.

## 2017-09-14 ENCOUNTER — Ambulatory Visit (HOSPITAL_COMMUNITY): Payer: Medicaid Other

## 2017-09-14 ENCOUNTER — Encounter (HOSPITAL_COMMUNITY)
Admission: RE | Disposition: A | Discharge status: Home / Self Care | Payer: Self-pay | Source: Ambulatory Visit | Attending: Vascular Surgery

## 2017-09-14 ENCOUNTER — Ambulatory Visit (HOSPITAL_COMMUNITY): Payer: Medicaid Other | Admitting: Certified Registered Nurse Anesthetist

## 2017-09-14 ENCOUNTER — Encounter (HOSPITAL_COMMUNITY): Payer: Self-pay | Admitting: Surgery

## 2017-09-14 ENCOUNTER — Ambulatory Visit (HOSPITAL_COMMUNITY)
Admission: RE | Admit: 2017-09-14 | Discharge: 2017-09-14 | Disposition: A | Discharge status: Home / Self Care | Payer: Medicaid Other | Source: Ambulatory Visit | Attending: Vascular Surgery | Admitting: Vascular Surgery

## 2017-09-14 DIAGNOSIS — E1143 Type 2 diabetes mellitus with diabetic autonomic (poly)neuropathy: Secondary | ICD-10-CM | POA: Diagnosis not present

## 2017-09-14 DIAGNOSIS — Y832 Surgical operation with anastomosis, bypass or graft as the cause of abnormal reaction of the patient, or of later complication, without mention of misadventure at the time of the procedure: Secondary | ICD-10-CM | POA: Insufficient documentation

## 2017-09-14 DIAGNOSIS — Z794 Long term (current) use of insulin: Secondary | ICD-10-CM | POA: Diagnosis not present

## 2017-09-14 DIAGNOSIS — Z992 Dependence on renal dialysis: Secondary | ICD-10-CM | POA: Insufficient documentation

## 2017-09-14 DIAGNOSIS — Z7982 Long term (current) use of aspirin: Secondary | ICD-10-CM | POA: Diagnosis not present

## 2017-09-14 DIAGNOSIS — N185 Chronic kidney disease, stage 5: Secondary | ICD-10-CM

## 2017-09-14 DIAGNOSIS — I2510 Atherosclerotic heart disease of native coronary artery without angina pectoris: Secondary | ICD-10-CM | POA: Insufficient documentation

## 2017-09-14 DIAGNOSIS — T82848A Pain from vascular prosthetic devices, implants and grafts, initial encounter: Secondary | ICD-10-CM | POA: Diagnosis not present

## 2017-09-14 DIAGNOSIS — Z90710 Acquired absence of both cervix and uterus: Secondary | ICD-10-CM | POA: Insufficient documentation

## 2017-09-14 DIAGNOSIS — Z7902 Long term (current) use of antithrombotics/antiplatelets: Secondary | ICD-10-CM | POA: Insufficient documentation

## 2017-09-14 DIAGNOSIS — K219 Gastro-esophageal reflux disease without esophagitis: Secondary | ICD-10-CM | POA: Diagnosis not present

## 2017-09-14 DIAGNOSIS — Z79899 Other long term (current) drug therapy: Secondary | ICD-10-CM | POA: Diagnosis not present

## 2017-09-14 DIAGNOSIS — Z9049 Acquired absence of other specified parts of digestive tract: Secondary | ICD-10-CM | POA: Insufficient documentation

## 2017-09-14 DIAGNOSIS — J45909 Unspecified asthma, uncomplicated: Secondary | ICD-10-CM | POA: Insufficient documentation

## 2017-09-14 DIAGNOSIS — E1122 Type 2 diabetes mellitus with diabetic chronic kidney disease: Secondary | ICD-10-CM | POA: Insufficient documentation

## 2017-09-14 DIAGNOSIS — N179 Acute kidney failure, unspecified: Secondary | ICD-10-CM | POA: Diagnosis not present

## 2017-09-14 DIAGNOSIS — Z87891 Personal history of nicotine dependence: Secondary | ICD-10-CM | POA: Diagnosis not present

## 2017-09-14 DIAGNOSIS — I120 Hypertensive chronic kidney disease with stage 5 chronic kidney disease or end stage renal disease: Secondary | ICD-10-CM | POA: Diagnosis not present

## 2017-09-14 DIAGNOSIS — N186 End stage renal disease: Secondary | ICD-10-CM

## 2017-09-14 DIAGNOSIS — Z888 Allergy status to other drugs, medicaments and biological substances status: Secondary | ICD-10-CM | POA: Diagnosis not present

## 2017-09-14 DIAGNOSIS — H269 Unspecified cataract: Secondary | ICD-10-CM | POA: Diagnosis not present

## 2017-09-14 DIAGNOSIS — Z955 Presence of coronary angioplasty implant and graft: Secondary | ICD-10-CM | POA: Insufficient documentation

## 2017-09-14 DIAGNOSIS — Z86718 Personal history of other venous thrombosis and embolism: Secondary | ICD-10-CM | POA: Diagnosis not present

## 2017-09-14 DIAGNOSIS — Z880 Allergy status to penicillin: Secondary | ICD-10-CM | POA: Insufficient documentation

## 2017-09-14 DIAGNOSIS — D649 Anemia, unspecified: Secondary | ICD-10-CM | POA: Diagnosis not present

## 2017-09-14 DIAGNOSIS — Z8249 Family history of ischemic heart disease and other diseases of the circulatory system: Secondary | ICD-10-CM | POA: Insufficient documentation

## 2017-09-14 DIAGNOSIS — N184 Chronic kidney disease, stage 4 (severe): Secondary | ICD-10-CM

## 2017-09-14 DIAGNOSIS — Z9889 Other specified postprocedural states: Secondary | ICD-10-CM | POA: Insufficient documentation

## 2017-09-14 HISTORY — PX: INSERTION OF DIALYSIS CATHETER: SHX1324

## 2017-09-14 HISTORY — DX: Peripheral vascular disease, unspecified: I73.9

## 2017-09-14 HISTORY — PX: FISTULA SUPERFICIALIZATION: SHX6341

## 2017-09-14 LAB — GLUCOSE, CAPILLARY
GLUCOSE-CAPILLARY: 117 mg/dL — AB (ref 70–99)
Glucose-Capillary: 174 mg/dL — ABNORMAL HIGH (ref 70–99)
Glucose-Capillary: 62 mg/dL — ABNORMAL LOW (ref 70–99)
Glucose-Capillary: 58 mg/dL — ABNORMAL LOW (ref 70–99)
Glucose-Capillary: 87 mg/dL (ref 70–99)

## 2017-09-14 LAB — HEMOGLOBIN A1C
HEMOGLOBIN A1C: 8.3 % — AB (ref 4.8–5.6)
MEAN PLASMA GLUCOSE: 191.51 mg/dL

## 2017-09-14 LAB — POCT I-STAT 4, (NA,K, GLUC, HGB,HCT)
Glucose, Bld: 153 mg/dL — ABNORMAL HIGH (ref 70–99)
HCT: 40 % (ref 36.0–46.0)
HEMOGLOBIN: 13.6 g/dL (ref 12.0–15.0)
Potassium: 4.1 mmol/L (ref 3.5–5.1)
SODIUM: 141 mmol/L (ref 135–145)

## 2017-09-14 SURGERY — FISTULA SUPERFICIALIZATION
Anesthesia: Monitor Anesthesia Care | Site: Neck

## 2017-09-14 MED ORDER — PROPOFOL 500 MG/50ML IV EMUL
INTRAVENOUS | Status: DC | PRN
  Administered 2017-09-14: 75 ug/kg/min via INTRAVENOUS

## 2017-09-14 MED ORDER — DEXTROSE 50 % IV SOLN
25.0000 mL | Freq: Once | INTRAVENOUS | Status: AC
  Administered 2017-09-14: 25 mL via INTRAVENOUS
  Filled 2017-09-14: qty 50

## 2017-09-14 MED ORDER — DEXTROSE 50 % IV SOLN
INTRAVENOUS | Status: AC
  Administered 2017-09-14: 25 mL via INTRAVENOUS
  Filled 2017-09-14: qty 50

## 2017-09-14 MED ORDER — FENTANYL CITRATE (PF) 100 MCG/2ML IJ SOLN
INTRAMUSCULAR | Status: AC
  Filled 2017-09-14: qty 2

## 2017-09-14 MED ORDER — SODIUM CHLORIDE 0.9 % IV SOLN
INTRAVENOUS | Status: DC
  Administered 2017-09-14 (×2): via INTRAVENOUS

## 2017-09-14 MED ORDER — MIDAZOLAM HCL 2 MG/2ML IJ SOLN
INTRAMUSCULAR | Status: AC
  Filled 2017-09-14: qty 2

## 2017-09-14 MED ORDER — ONDANSETRON HCL 4 MG/2ML IJ SOLN
INTRAMUSCULAR | Status: DC | PRN
  Administered 2017-09-14: 4 mg via INTRAVENOUS

## 2017-09-14 MED ORDER — PROPOFOL 10 MG/ML IV BOLUS
INTRAVENOUS | Status: DC | PRN
  Administered 2017-09-14: 20 mg via INTRAVENOUS

## 2017-09-14 MED ORDER — CHLORHEXIDINE GLUCONATE 4 % EX LIQD: 60.0000 mL | Freq: Once | CUTANEOUS | Status: DC

## 2017-09-14 MED ORDER — HEPARIN SODIUM (PORCINE) 1000 UNIT/ML IJ SOLN
INTRAMUSCULAR | Status: AC
  Filled 2017-09-14: qty 1

## 2017-09-14 MED ORDER — 0.9 % SODIUM CHLORIDE (POUR BTL) OPTIME
TOPICAL | Status: DC | PRN
  Administered 2017-09-14: 1000 mL

## 2017-09-14 MED ORDER — VANCOMYCIN HCL IN DEXTROSE 1-5 GM/200ML-% IV SOLN
1000.0000 mg | INTRAVENOUS | Status: DC
  Filled 2017-09-14: qty 200

## 2017-09-14 MED ORDER — LIDOCAINE-EPINEPHRINE (PF) 1 %-1:200000 IJ SOLN
INTRAMUSCULAR | Status: DC | PRN
  Administered 2017-09-14: 30 mL

## 2017-09-14 MED ORDER — LIDOCAINE HCL (CARDIAC) PF 100 MG/5ML IV SOSY
PREFILLED_SYRINGE | INTRAVENOUS | Status: DC | PRN
  Administered 2017-09-14: 100 mg via INTRAVENOUS

## 2017-09-14 MED ORDER — FENTANYL CITRATE (PF) 100 MCG/2ML IJ SOLN
INTRAMUSCULAR | Status: DC | PRN
  Administered 2017-09-14: 50 ug via INTRAVENOUS
  Administered 2017-09-14 (×3): 25 ug via INTRAVENOUS

## 2017-09-14 MED ORDER — MIDAZOLAM HCL 2 MG/2ML IJ SOLN
INTRAMUSCULAR | Status: DC | PRN
  Administered 2017-09-14: 2 mg via INTRAVENOUS

## 2017-09-14 MED ORDER — FENTANYL CITRATE (PF) 100 MCG/2ML IJ SOLN
25.0000 ug | INTRAMUSCULAR | Status: DC | PRN
  Administered 2017-09-14 (×2): 50 ug via INTRAVENOUS

## 2017-09-14 MED ORDER — HEPARIN SODIUM (PORCINE) 1000 UNIT/ML IJ SOLN
INTRAMUSCULAR | Status: DC | PRN
  Administered 2017-09-14: 1000 [IU]

## 2017-09-14 MED ORDER — SODIUM CHLORIDE 0.9 % IV SOLN
INTRAVENOUS | Status: DC | PRN
  Administered 2017-09-14: 500 mL

## 2017-09-14 MED ORDER — SODIUM CHLORIDE 0.9 % IV SOLN
INTRAVENOUS | Status: AC
  Filled 2017-09-14: qty 1.2

## 2017-09-14 MED ORDER — OXYCODONE-ACETAMINOPHEN 5-325 MG PO TABS: 1.0000 | ORAL_TABLET | Freq: Four times a day (QID) | ORAL | 0 refills | Status: DC | PRN

## 2017-09-14 MED ORDER — FENTANYL CITRATE (PF) 250 MCG/5ML IJ SOLN
INTRAMUSCULAR | Status: AC
  Filled 2017-09-14: qty 5

## 2017-09-14 MED ORDER — LABETALOL HCL 5 MG/ML IV SOLN
INTRAVENOUS | Status: AC
  Filled 2017-09-14: qty 4

## 2017-09-14 MED ORDER — VANCOMYCIN HCL 1000 MG IV SOLR
INTRAVENOUS | Status: DC | PRN
  Administered 2017-09-14: 1000 mg via INTRAVENOUS

## 2017-09-14 MED ORDER — LABETALOL HCL 5 MG/ML IV SOLN
10.0000 mg | INTRAVENOUS | Status: DC | PRN
  Administered 2017-09-14: 10 mg via INTRAVENOUS

## 2017-09-14 MED ORDER — FLUCONAZOLE 150 MG PO TABS: 150.0000 mg | ORAL_TABLET | Freq: Every day | ORAL | 0 refills | Status: DC

## 2017-09-14 MED ORDER — LIDOCAINE-EPINEPHRINE (PF) 1 %-1:200000 IJ SOLN
INTRAMUSCULAR | Status: AC
  Filled 2017-09-14: qty 30

## 2017-09-14 SURGICAL SUPPLY — 61 items
ADH SKN CLS APL DERMABOND .7 (GAUZE/BANDAGES/DRESSINGS) ×4
AGENT HMST SPONGE THK3/8 (HEMOSTASIS)
ARMBAND PINK RESTRICT EXTREMIT (MISCELLANEOUS) ×3 IMPLANT
BAG DECANTER FOR FLEXI CONT (MISCELLANEOUS) ×3 IMPLANT
BIOPATCH RED 1 DISK 7.0 (GAUZE/BANDAGES/DRESSINGS) ×3 IMPLANT
CANISTER SUCT 3000ML PPV (MISCELLANEOUS) ×3 IMPLANT
CATH PALINDROME RT-P 15FX19CM (CATHETERS) ×2 IMPLANT
CATH PALINDROME RT-P 15FX23CM (CATHETERS) IMPLANT
CATH PALINDROME RT-P 15FX28CM (CATHETERS) IMPLANT
CATH PALINDROME RT-P 15FX55CM (CATHETERS) IMPLANT
CATH STRAIGHT 5FR 65CM (CATHETERS) IMPLANT
CLIP VESOCCLUDE MED 6/CT (CLIP) ×3 IMPLANT
CLIP VESOCCLUDE SM WIDE 6/CT (CLIP) ×4 IMPLANT
COVER PROBE W GEL 5X96 (DRAPES) ×3 IMPLANT
COVER SURGICAL LIGHT HANDLE (MISCELLANEOUS) ×3 IMPLANT
DECANTER SPIKE VIAL GLASS SM (MISCELLANEOUS) ×3 IMPLANT
DERMABOND ADVANCED (GAUZE/BANDAGES/DRESSINGS) ×2
DERMABOND ADVANCED .7 DNX12 (GAUZE/BANDAGES/DRESSINGS) IMPLANT
DRAPE C-ARM 42X72 X-RAY (DRAPES) ×3 IMPLANT
DRAPE CHEST BREAST 15X10 FENES (DRAPES) ×3 IMPLANT
DRSG COVADERM 4X6 (GAUZE/BANDAGES/DRESSINGS) ×2 IMPLANT
ELECT CAUTERY BLADE 6.4 (BLADE) ×1 IMPLANT
ELECT REM PT RETURN 9FT ADLT (ELECTROSURGICAL) ×3
ELECTRODE REM PT RTRN 9FT ADLT (ELECTROSURGICAL) ×2 IMPLANT
GAUZE 4X4 16PLY RFD (DISPOSABLE) ×3 IMPLANT
GLOVE BIO SURGEON STRL SZ7.5 (GLOVE) ×3 IMPLANT
GLOVE BIOGEL PI IND STRL 8 (GLOVE) ×2 IMPLANT
GLOVE BIOGEL PI INDICATOR 8 (GLOVE) ×1
GOWN STRL REUS W/ TWL LRG LVL3 (GOWN DISPOSABLE) ×4 IMPLANT
GOWN STRL REUS W/ TWL XL LVL3 (GOWN DISPOSABLE) ×4 IMPLANT
GOWN STRL REUS W/TWL LRG LVL3 (GOWN DISPOSABLE) ×6
GOWN STRL REUS W/TWL XL LVL3 (GOWN DISPOSABLE) ×6
HEMOSTAT SPONGE AVITENE ULTRA (HEMOSTASIS) IMPLANT
KIT BASIN OR (CUSTOM PROCEDURE TRAY) ×3 IMPLANT
KIT TURNOVER KIT B (KITS) ×3 IMPLANT
NDL 18GX1X1/2 (RX/OR ONLY) (NEEDLE) ×1 IMPLANT
NDL HYPO 25GX1X1/2 BEV (NEEDLE) ×2 IMPLANT
NEEDLE 18GX1X1/2 (RX/OR ONLY) (NEEDLE) ×3 IMPLANT
NEEDLE HYPO 25GX1X1/2 BEV (NEEDLE) ×3 IMPLANT
NS IRRIG 1000ML POUR BTL (IV SOLUTION) ×3 IMPLANT
PACK CV ACCESS (CUSTOM PROCEDURE TRAY) ×3 IMPLANT
PACK SURGICAL SETUP 50X90 (CUSTOM PROCEDURE TRAY) ×3 IMPLANT
PAD ARMBOARD 7.5X6 YLW CONV (MISCELLANEOUS) ×6 IMPLANT
PENCIL BUTTON BLDE SNGL 10FT (ELECTRODE) ×2 IMPLANT
SET MICROPUNCTURE 5F STIFF (MISCELLANEOUS) IMPLANT
SOAP 2 % CHG 4 OZ (WOUND CARE) ×3 IMPLANT
SUT ETHILON 3 0 PS 1 (SUTURE) ×5 IMPLANT
SUT MNCRL AB 4-0 PS2 18 (SUTURE) ×7 IMPLANT
SUT PROLENE 6 0 BV (SUTURE) IMPLANT
SUT PROLENE 7 0 BV 1 (SUTURE) ×3 IMPLANT
SUT VIC AB 3-0 SH 27 (SUTURE) ×6
SUT VIC AB 3-0 SH 27X BRD (SUTURE) ×4 IMPLANT
SYR 10ML LL (SYRINGE) ×3 IMPLANT
SYR 20CC LL (SYRINGE) ×6 IMPLANT
SYR 5ML LL (SYRINGE) ×3 IMPLANT
SYR CONTROL 10ML LL (SYRINGE) ×3 IMPLANT
TOWEL GREEN STERILE (TOWEL DISPOSABLE) ×3 IMPLANT
TOWEL GREEN STERILE FF (TOWEL DISPOSABLE) ×3 IMPLANT
UNDERPAD 30X30 (UNDERPADS AND DIAPERS) ×3 IMPLANT
WATER STERILE IRR 1000ML POUR (IV SOLUTION) ×3 IMPLANT
WIRE AMPLATZ SS-J .035X180CM (WIRE) IMPLANT

## 2017-09-14 NOTE — H&P (Signed)
History and Physical Interval Note:  09/14/2017 12:53 PM  Susan Horton  has presented today for surgery, with the diagnosis of COMPLICATION WITH ARTERIOVENOUS FISTULA LEFT ARM  The various methods of treatment have been discussed with the patient and family. After consideration of risks, benefits and other options for treatment, the patient has consented to  Procedure(s): FISTULA SUPERFICIALIZATION ARTERIOVENOUS FISTULA LEFT ARM (Left) INSERTION OF TUNNELED DIALYSIS CATHETER (N/A) as a surgical intervention .  The patient's history has been reviewed, patient examined, no change in status, stable for surgery.  I have reviewed the patient's chart and labs.  Questions were answered to the patient's satisfaction.    Superficialization left brachiocephalic fistula and tunneled dialysis catheter.  [name]  CC: Pain in right arm  Requesting Provider: [name], MD  HPI: Susan Horton who is now ESRD on dialysis T/T/S at the [facility] location. Susan Horton underwent creation of a left BC AVF on 02/01/17 by Dr. [name]. In June, Susan Horton underwent a fistulogram by Dr. [name], which revealed a patent fistula with small branches and was 1.5cm deep.  Susan Horton started HD in July and tells me that after a couple of hours on the machine, her left arm from the elbow down becomes painful and her hand becomes numb and cold. The numbness starts in her left 4th and 5th fingers and progresses to her hand and arm. Susan Horton states they also have a difficult time cannulating the fistula and almost always have to manipulate the needles to get them to work. They have created a couple of buttonholes to help with cannulation. Susan Horton states that the pain becomes immediately better once Susan Horton is off the machine.  Susan Horton states that Susan Horton is going to be evaluated for PD in the near future but even if Susan Horton is a candidate that it will be weeks before that is initiated.  Pt is on Plavix/aspirin. Susan Horton has hx of mid LAD drug eluding  stenting on 08/05/16 by Dr. Gwenlyn Found. The pt is on a statin for cholesterol management. Susan Horton is on insulin for diabetes. Susan Horton is on a beta blocker for HTN.      Past Medical History:  Diagnosis Date  . Anemia   . Asthma   . CAD (coronary artery disease)    DES to mid LAD July 2018, residual moderate RCA disease  . Cataract   . CKD (chronic kidney disease) stage 4, GFR 15-29 ml/min (HCC)   . DVT (deep venous thrombosis) ([place])    1996 during pregnancy, 2015 left leg  . Essential hypertension 12/11/2015  . Gastroparesis   . GERD (gastroesophageal reflux disease)   . Hidradenitis   . Migraine   . Neuropathy   . Type 2 diabetes mellitus ([place])   . Type II diabetes mellitus ([place])         Past Surgical History:  Procedure Laterality Date  . A/V FISTULAGRAM N/A 06/22/2017   Procedure: A/V FISTULAGRAM - left arm; Surgeon: [name], MD; Location: [facility]; Service: Cardiovascular; Laterality: N/A;  . ABDOMINOPLASTY    . APPENDECTOMY  1995  . AV FISTULA PLACEMENT Left 02/01/2017   Procedure: ARTERIOVENOUS BRACHIOCEPHALIC (AV) FISTULA CREATION; Surgeon: [name], MD; Location: [place]; Service: Vascular; Laterality: Left;  . CHOLECYSTECTOMY  1993  . CORONARY ANGIOPLASTY WITH STENT PLACEMENT    . CORONARY STENT INTERVENTION N/A 08/05/2016   Procedure: Coronary Stent Intervention; Surgeon: [name], MD; Location: [facility]; Service:  Cardiovascular; Laterality: N/A;  . EXPLORATORY LAPAROTOMY  08/14/2005   lysis of adhesions, drainage of tubo-ovarian abscess  . HYDRADENITIS EXCISION  02/08/2011   Procedure: EXCISION HYDRADENITIS GROIN; Surgeon: [name], MD; Location: [place]; Service: General; Laterality: N/A; Excisioin of Hidradenitis Left groin  . INGUINAL HIDRADENITIS EXCISION  07/06/2010   bilateral  . LEFT HEART CATH AND CORONARY ANGIOGRAPHY N/A 08/05/2016   Procedure: Left Heart Cath and Coronary Angiography; Surgeon:  [name], MD; Location: [place] [facility]; Service: Cardiovascular; Laterality: N/A;  . REDUCTION MAMMAPLASTY  2002  . TONSILLECTOMY    . TUBAL LIGATION  1996  . VAGINAL HYSTERECTOMY  08/04/2005   and cysto        Allergies  Allergen Reactions  . Penicillins Anaphylaxis, Itching, Swelling, Rash and Other (See Comments)    Swelling of throat & whole mouth  Has patient had a PCN reaction causing immediate rash, facial/tongue/throat swelling, SOB or lightheadedness with hypotension: Yes  Has patient had a PCN reaction causing severe rash involving mucus membranes or skin necrosis: No  Has patient had a PCN reaction that required hospitalization: No  Has patient had a PCN reaction occurring within the last 10 years: No  If all of the above answers are "NO", then may proceed with Cephalosporin use.  [name] Lisinopril Cough         Current Outpatient Medications  Medication Sig Dispense Refill  . albuterol (PROVENTIL HFA;VENTOLIN HFA) 108 (90 BASE) MCG/ACT inhaler Inhale 1-2 puffs into the lungs every 6 (six) hours as needed for wheezing or shortness of breath.     [name] amitriptyline (ELAVIL) 25 MG tablet Take 1 tablet (25 mg total) by mouth at bedtime. 30 tablet 1  . aspirin 81 MG chewable tablet Chew 1 tablet (81 mg total) by mouth daily. 30 tablet 0  . capsicum (ZOSTRIX) 0.075 % topical cream Apply 1 application topically 2 (two) times daily. 28.3 g 0  . Cholecalciferol (VITAMIN D-3 PO) Take 1 capsule by mouth daily.    . clopidogrel (PLAVIX) 75 MG tablet Take 1 tablet (75 mg total) by mouth daily with breakfast. 90 tablet 2  . DULoxetine (CYMBALTA) 60 MG capsule Take 1 capsule (60 mg total) by mouth daily. 30 capsule 3  . Ferrous Sulfate (IRON) 325 (65 Fe) MG TABS Take 325 mg by mouth daily. 30 each 3  . Fluticasone-Salmeterol (ADVAIR) 250-50 MCG/DOSE AEPB Inhale 1 puff into the lungs 2 (two) times daily. 60 each 3  . Insulin Glargine (TOUJEO SOLOSTAR) 300 UNIT/ML SOPN Inject 15 Units  into the skin 2 (two) times daily. 12 pen 0  . insulin regular (HUMULIN R) 100 units/mL injection Use with meals. CBG 121-150: 2 u; CBG 151-200: 3 u; CBG 201-250: 5 u; CBG 251-300: 8 u; CBG 301-350: 11; CBG 351-400: 15 u 30 mL 5  . lovastatin (MEVACOR) 20 MG tablet Take 20 mg by mouth at bedtime.     . methocarbamol (ROBAXIN) 500 MG tablet Take 1 tablet (500 mg total) by mouth 2 (two) times daily as needed for muscle spasms. 60 tablet 1  . metoCLOPramide (REGLAN) 5 MG tablet TAKE 1 TABLET BY MOUTH 4 TIMES DAILY BEFORE MEALS AND AT BEDTIME. 90 tablet 0  . metoprolol succinate (TOPROL-XL) 50 MG 24 hr tablet Take 50 mg by mouth 2 (two) times daily.  6  . mirtazapine (REMERON SOL-TAB) 15 MG disintegrating tablet TAKE 1 TABLET BY MOUTH AT BEDTIME. 30 tablet 0  . montelukast (  SINGULAIR) 10 MG tablet Take 10 mg by mouth daily as needed (allergies).     . multivitamin (RENA-VIT) TABS tablet Take 1 tablet by mouth at bedtime.  0  . nitroGLYCERIN (NITROSTAT) 0.4 MG SL tablet Place 1 tablet (0.4 mg total) under the tongue every 5 (five) minutes as needed. (Patient taking differently: Place 0.4 mg under the tongue every 5 (five) minutes as needed for chest pain. ) 25 tablet 3  . pantoprazole (PROTONIX) 40 MG tablet TAKE 1 TABLET BY MOUTH DAILY. 30 tablet 2  . pregabalin (LYRICA) 50 MG capsule Take 1 capsule (50 mg total) by mouth daily. 30 capsule 1  . senna-docusate (SENOKOT-S) 8.6-50 MG tablet Take 1 tablet by mouth 2 (two) times daily. (Patient taking differently: Take 1 tablet by mouth 2 (two) times daily as needed for mild constipation. )    . trimethoprim-polymyxin b (POLYTRIM) ophthalmic solution INSTILL ONE DROP INTO BOTH EYES 4 TIMES A DAY FOR 2 DAYS AFTER EACH MONTHLY EYE INJECTION  12   No current facility-administered medications for Susan visit.         Family History  Problem Relation Age of Onset  . Hypertension Father   . Hypertension Sister   . Hypertension Brother   . Breast cancer Cousin     x2   Social History        Socioeconomic History  . Marital status: Single    Spouse name: Not on file  . Number of children: 3  . Years of education: 25  . Highest education level: Not on file  Occupational History  . Occupation: Curator  Social Needs  . Financial resource strain: Not on file  . Food insecurity:    Worry: Not on file    Inability: Not on file  . Transportation needs:    Medical: Not on file    Non-medical: Not on file  Tobacco Use  . Smoking status: Former Smoker    Years: 20.00    Types: Cigarettes    Last attempt to quit: 11/2016    Years since quitting: 0.8  . Smokeless tobacco: Never Used  . Tobacco comment: Smokes on rare occasions  Substance and Sexual Activity  . Alcohol use: No    Alcohol/week: 0.0 standard drinks  . Drug use: No  . Sexual activity: Not on file  Lifestyle  . Physical activity:    Days per week: Not on file    Minutes per session: Not on file  . Stress: Not on file  Relationships  . Social connections:    Talks on phone: Not on file    Gets together: Not on file    Attends religious service: Not on file    Active member of club or organization: Not on file    Attends meetings of clubs or organizations: Not on file    Relationship status: Not on file  . Intimate partner violence:    Fear of current or ex partner: Not on file    Emotionally abused: Not on file    Physically abused: Not on file    Forced sexual activity: Not on file  Other Topics Concern  . Not on file  Social History Narrative   Lives with daughter in a 2 story home. Has 3 children. 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 Education: college.    REVIEW OF SYSTEMS:  [X]  denotes positive finding, [ ]  denotes negative finding  Cardiac  Comments:  Chest pain or  chest pressure:    Shortness of breath upon exertion:    Short of breath when lying flat:    Irregular heart rhythm:        Vascular    Pain in calf,  thigh, or hip brought on by ambulation:    Pain in feet at night that wakes you up from your sleep:  x   Blood clot in your veins:    Leg swelling:         Pulmonary    Oxygen at home:    Productive cough:     Wheezing:         Neurologic    Sudden weakness in arms or legs:     Sudden numbness in arms or legs:     Sudden onset of difficulty speaking or slurred speech:    Temporary loss of vision in one eye:     Problems with dizziness:         Gastrointestinal    Blood in stool:     Vomited blood:         Genitourinary    Burning when urinating:     Blood in urine:        Psychiatric    Major depression:         Hematologic    Bleeding problems:    Problems with blood clotting too easily:        Skin    Rashes or ulcers:        Constitutional    Fever or chills:    PHYSICAL EXAMINATION:      Vitals:   09/07/17 1354 09/07/17 1355  BP: (!) 174/89 (!) 171/84  Pulse: 98   Resp: 18   SpO2: 99%       Vitals:   09/07/17 1354  Weight: 167 lb (75.8 kg)  Height: 5\' 6"  (1.676 m)   Body mass index is 26.95 kg/m.  General: WDWN in NAD; vital signs documented above  Gait: Not observed  HENT: WNL, normocephalic  Pulmonary: normal non-labored breathing , without Rales, rhonchi, wheezing  Cardiac: regular HR, without Murmurs with carotid bruits bilaterally  Abdomen: soft, NT, no masses  Skin: without rashes; tattoos present bilateral chest below clavicles.  Vascular Exam/Pulses:   Right Left  Radial 2+ (normal) 2+ (normal)  Ulnar Unable to palpate  Unable to palpate   PT Unable to palpate  Unable to palpate   Extremities: without ischemic changes, without Gangrene , without cellulitis; without open wounds; fistula is easy to palpate but is deeper proximally; there is a +thrill/bruit present. Two buttonholes present on fistula but no ulceration.  Musculoskeletal: no muscle wasting or atrophy  Neurologic: A&O X 3; No focal weakness or paresthesias are detected   Psychiatric: The pt has Normal affect.  Non-Invasive Vascular Imaging:  Dialysis steal study 09/07/17:  Left radial with AVF compression 169 mmHg  -left radial pressure and peak systolic velocity increase with AVF compression  Carotid duplex 07/13/17:  Final Interpretation: Right Carotid: Velocities in the right ICA are consistent with a 1-39% stenosis.  Left Carotid: Velocities in the left ICA are consistent with a 40-59% stenosis.  Vertebrals: Bilateral vertebral arteries demonstrate antegrade flow. Subclavians: Normal flow hemodynamics were seen in bilateral subclavian arteries.  Pt meds includes:  Statin: Yes.  Beta Blocker: Yes.  Aspirin: Yes.  ACEI: No.  ARB: No.  CCB use: No  Other Antiplatelet/Anticoagulant: Yes Plavix  ASSESSMENT/PLAN:: Susan Horton with ESRD on dialysis T/T/S and s/p  left BC AVF 02/01/17 by Dr. [name] presents today with left arm/hand pain on dialysis & difficult cannulation.  -pt with arm pain/numbness and coolness from elbow down to hand after about 2 hours on dialysis. Susan Horton states Susan is immediately better after Susan Horton is off dialysis. Susan Horton does not have any motor impairment of her left hand. Susan Horton states Susan Horton does have neuropathy in her feet and hands. Her fistula is also difficult to cannulate. Dr. [name] did a fistulogram in June and her fistula was patent but ~ 1.5cm deep and recommended if trouble cannulating, Susan Horton may need superficialization.  -I discussed pt with Dr. [name]. If pt can tolerate the sx, Susan Horton can continue to use the fistula, however, if Susan Horton cannot, Susan Horton would need new access in her right arm. I discussed Susan with the pt and Susan Horton does not want to proceed with new access in the right arm. However, when discussing difficulty cannulation, we discussed superficialization and that it would make it easier to cannulate. Susan Horton understands that if Susan Horton proceeds with Susan, Susan Horton may need a tunneled dialysis catheter until wounds heal. Susan Horton would like to proceed with  superficialization so Susan will be scheduled with Dr. [name] on a Monday ( non dialysis day).  -Susan Horton is going to be evaluated for PD but even if Susan Horton is a candidate, Susan Horton states Susan will be weeks away and wants to proceed with superficialization.  -Susan Horton does have tattoos bilaterally below her clavicles-will most likely be able to tunnel the catheter below the tattoo if Susan Horton does need it.  Leontine Locket, PA-C  [facility]  [phone]  Clinic MD: [name]

## 2017-09-14 NOTE — Anesthesia Postprocedure Evaluation (Signed)
Anesthesia Post Note  Patient: Susan Horton  Procedure(s) Performed: FISTULA SUPERFICIALIZATION ARTERIOVENOUS FISTULA LEFT ARM (Left ) INSERTION OF TUNNELED DIALYSIS CATHETER Right Internal Jugular (N/A Neck)     Patient location during evaluation: PACU Anesthesia Type: MAC Level of consciousness: awake Pain management: pain level controlled Vital Signs Assessment: post-procedure vital signs reviewed and stable Respiratory status: spontaneous breathing Cardiovascular status: stable Postop Assessment: no headache Anesthetic complications: no    Last Vitals:  Vitals:   09/14/17 1016 09/14/17 1520  BP: (!) 178/93 (!) 115/53  Pulse: 94 (!) 106  Resp: 18 12  Temp: (!) 36.4 C 36.4 C  SpO2: 100% 95%    Last Pain:  Vitals:   09/14/17 1520  TempSrc:   PainSc: 7                  Heavenly Christine

## 2017-09-14 NOTE — Op Note (Signed)
OPERATIVE NOTE  DATE: September 14, 2017  PROCEDURE: 1. Right internal jugular vein tunneled dialysis catheter placement 2. Right internal jugular vein cannulation under ultrasound guidance 3. Left arteriovenous fistula revision with superficialization of left brachiocephalic AVF  PRE-OPERATIVE DIAGNOSIS: end-stage renal failure  POST-OPERATIVE DIAGNOSIS: same as above  SURGEON: [name], MD  ANESTHESIA: MAC  ESTIMATED BLOOD LOSS: 30 cc  FINDING(S): 1.  Tips of the catheter in the right atrium on fluoroscopy 2.  No obvious pneumothorax on fluoroscopy  SPECIMEN(S):  none  INDICATIONS:   Susan Horton is a 47 y.o. female who presents with end stage renal disease.  The patient presents for tunneled dialysis catheter placement and revision of a left brachiocephalic AVF that is deep and difficult to cannulate.  The patient is aware the risks of tunneled dialysis catheter placement include but are not limited to: bleeding, infection, central venous injury, pneumothorax, possible venous stenosis, possible malpositioning in the venous system, and possible infections related to long-term catheter presence.  The patient was aware of these risks and agreed to proceed.  DESCRIPTION: After written full informed consent was obtained from the patient, the patient was taken back to the operating room.  Prior to induction, the patient was given IV antibiotics.  After obtaining adequate sedation, the patient was prepped and draped in the standard fashion for a chest or neck tunneled dialysis catheter placement.  The cannulation site, the catheter exit site, and tract for the subcutaneous tunnel were then anesthestized with a total of 5 cc of 1% lidocaine without epinephrine.  Under ultrasound guidance, the right internal jugular vein was cannulated with the 18 gauge needle.  A J-wire was then placed down into the right ventricle under fluoroscopic guidance.  The skin tract and venotomy  was dilated serially with dilators.  Finally, the dilator-sheath was placed under fluoroscopic guidance into the superior vena cava.  The dilator and wire were removed. The wire was then secured in place with a clamp to the drapes.  I then made a stab incision at the neck and exit sites.   I dissected from the exit site to the cannulation site with a tunneler. The wire was then unclamped and I removed the needle.   A 19 cm Diatek catheter was placed under fluoroscopic guidance down into the right atrium.  The sheath was broken and peeled away while holding the catheter cuff at the level of the skin.  The back end of this catheter was transected, and docked onto the tunneler.  The distal catheter was delivered through the subcutaneous tunnel.  The catheter was transected a second time, revealing the two lumens of this catheter.  The ports were docked onto these two lumens.  The catheter collar was then snapped into place.  Each port was tested by aspirating and flushing.  No resistance was noted.  Each port was then thoroughly flushed with heparinized saline.  The catheter was secured in placed with two interrupted stitches of 3-0 Nylon tied to the catheter.  The neck incision was closed with a U-stitch of 4-0 Monocryl.  The neck and chest incision were cleaned and sterile bandages applied.  Each port was then loaded with concentrated heparin (1000 Units/mL) at the manufacturer recommended volumes to each port.   On completion fluoroscopy, the tips of the catheter were in the right atrium, and there was no evidence of pneumothorax.  We then prepped and draped the left arm for the fistula revision portion of the  operation.  The cephalic vein was then marked on the left upper arm from the antecubitum up to the axilla under ultrasound guidance.  We made two longitudinal skin incisions over the fistula and dissected down to the cephalic vein.  The cephalic vein was then mobilized in its entirety with blunt dissection  and Bovie cautery.  It should be noted this fistula has been accessed numerous times in the past and is currently being used for dialysis and there was significant scarring and inflammation around the fistula that made mobilizing it very difficult.  All side branches encountered were ligated with 4-0 silk ties and small clips.  The incisions were then irrigated with saline until the effluent was clear.  Once this was completely mobilized we then closed the subcutaneous tissue underneath the fistula to superficialize the vein using interrupted 3-0 Vicryls.  We then ran the skin closed just over top the fistula with 4-0 Monocryl.  Patient had a palpable thrill and palpable distal radial pulse at completion of the case.   COMPLICATIONS: None  CONDITION: Stable  [name], MD [facility] [place] Office: [phone] Pager: [phone]   09/14/2017, 3:22 PM

## 2017-09-14 NOTE — Anesthesia Preprocedure Evaluation (Addendum)
Anesthesia Evaluation  Patient identified by MRN, date of birth, ID band Patient awake    Reviewed: Allergy & Precautions, NPO status , Patient's Chart, lab work & pertinent test results  Airway Mallampati: II  TM Distance: >3 FB     Dental  (+) Teeth Intact, Dental Advisory Given   Pulmonary asthma , former smoker,    breath sounds clear to auscultation       Cardiovascular hypertension, + angina + CAD, + Peripheral Vascular Disease and +CHF   Rhythm:Regular Rate:Normal     Neuro/Psych  Headaches,  Neuromuscular disease    GI/Hepatic Neg liver ROS, GERD  ,  Endo/Other  diabetes  Renal/GU Renal disease     Musculoskeletal   Abdominal   Peds  Hematology  (+) anemia ,   Anesthesia Other Findings   Reproductive/Obstetrics                            Anesthesia Physical Anesthesia Plan  ASA: III  Anesthesia Plan: MAC   Post-op Pain Management:    Induction: Intravenous  PONV Risk Score and Plan: Treatment may vary due to age or medical condition  Airway Management Planned: Nasal Cannula and Simple Face Mask  Additional Equipment:   Intra-op Plan:   Post-operative Plan:   Informed Consent:   Dental advisory given  Plan Discussed with: CRNA and Anesthesiologist  Anesthesia Plan Comments:         Anesthesia Quick Evaluation

## 2017-09-14 NOTE — Transfer of Care (Signed)
Immediate Anesthesia Transfer of Care Note  Patient: Susan Horton  Procedure(s) Performed: FISTULA SUPERFICIALIZATION ARTERIOVENOUS FISTULA LEFT ARM (Left ) INSERTION OF TUNNELED DIALYSIS CATHETER Right Internal Jugular (N/A Neck)  Patient Location: PACU  Anesthesia Type:MAC  Level of Consciousness: awake, alert  and oriented  Airway & Oxygen Therapy: Patient Spontanous Breathing  Post-op Assessment: Report given to RN, Post -op Vital signs reviewed and stable and Patient moving all extremities  Post vital signs: Reviewed and stable  Last Vitals:  Vitals Value Taken Time  BP 115/53 09/14/2017  3:20 PM  Temp    Pulse 106 09/14/2017  3:24 PM  Resp 16 09/14/2017  3:24 PM  SpO2 95 % 09/14/2017  3:24 PM  Vitals shown include unvalidated device data.  Last Pain:  Vitals:   09/14/17 1033  TempSrc:   PainSc: 0-No pain      Patients Stated Pain Goal: 3 (19/41/74 0814)  Complications: No apparent anesthesia complications

## 2017-09-14 NOTE — Discharge Instructions (Signed)
° °  [facility]  Discharge Instructions  AV Fistula or Graft Surgery for Dialysis Access  Please refer to the following instructions for your post-procedure care. Your surgeon or physician assistant will discuss any changes with you.  Activity  You may drive the day following your surgery, if you are comfortable and no longer taking prescription pain medication. Resume full activity as the soreness in your incision resolves.  Bathing/Showering  You may shower after you go home. Keep your incision dry for 48 hours. Do not soak in a bathtub, hot tub, or swim until the incision heals completely. You may not shower if you have a hemodialysis catheter.  Incision Care  Clean your incision with mild soap and water after 48 hours. Pat the area dry with a clean towel. You do not need a bandage unless otherwise instructed. Do not apply any ointments or creams to your incision. You may have skin glue on your incision. Do not peel it off. It will come off on its own in about one week. Your arm may swell a bit after surgery. To reduce swelling use pillows to elevate your arm so it is above your heart. Your doctor will tell you if you need to lightly wrap your arm with an ACE bandage.  Diet  Resume your normal diet. There are not special food restrictions following this procedure. In order to heal from your surgery, it is CRITICAL to get adequate nutrition. Your body requires vitamins, minerals, and protein. Vegetables are the best source of vitamins and minerals. Vegetables also provide the perfect balance of protein. Processed food has little nutritional value, so try to avoid this.  Medications  Resume taking all of your medications. If your incision is causing pain, you may take over-the counter pain relievers such as acetaminophen (Tylenol). If you were prescribed a stronger pain medication, please be aware these medications can cause nausea and constipation. Prevent  nausea by taking the medication with a snack or meal. Avoid constipation by drinking plenty of fluids and eating foods with high amount of fiber, such as fruits, vegetables, and grains.  Do not take Tylenol if you are taking prescription pain medications.  Follow up Your surgeon may want to see you in the office following your access surgery. If so, this will be arranged at the time of your surgery.  Please call us immediately for any of the following conditions:  Increased pain, redness, drainage (pus) from your incision site Fever of 101 degrees or higher Severe or worsening pain at your incision site Hand pain or numbness.  Reduce your risk of vascular disease:  Stop smoking. If you would like help, call [facility] at 1-800-QUIT-NOW (920) 220-2671) or [place] at [place] your cholesterol Maintain a desired weight Control your diabetes Keep your blood pressure down  Dialysis  It will take several weeks to several months for your new dialysis access to be ready for use. Your surgeon will determine when it is okay to use it. Your nephrologist will continue to direct your dialysis. You can continue to use your Permcath until your new access is ready for use.   09/14/2017 Susan Horton [id number] 30-Jun-1970  Surgeon(s): [name], MD  Procedure(s): FISTULA SUPERFICIALIZATION ARTERIOVENOUS FISTULA LEFT ARM INSERTION OF TUNNELED DIALYSIS CATHETER Right Internal Jugular  x Do not stick fistula for 6 weeks    If you have any questions, please call the office at [phone].

## 2017-09-15 ENCOUNTER — Encounter (HOSPITAL_COMMUNITY): Payer: Self-pay | Admitting: Vascular Surgery

## 2017-09-15 ENCOUNTER — Telehealth: Payer: Self-pay | Admitting: Vascular Surgery

## 2017-09-15 DIAGNOSIS — T829XXA Unspecified complication of cardiac and vascular prosthetic device, implant and graft, initial encounter: Secondary | ICD-10-CM | POA: Insufficient documentation

## 2017-09-15 NOTE — Telephone Encounter (Signed)
sch appt spk to pt 10/11/17 845am wound check

## 2017-09-22 ENCOUNTER — Ambulatory Visit: Payer: Medicaid Other | Admitting: Internal Medicine

## 2017-09-22 ENCOUNTER — Encounter: Payer: Self-pay | Admitting: Internal Medicine

## 2017-09-22 VITALS — BP 138/82 | HR 100 | Ht 66.0 in | Wt 166.0 lb

## 2017-09-22 DIAGNOSIS — G629 Polyneuropathy, unspecified: Secondary | ICD-10-CM

## 2017-09-22 DIAGNOSIS — E1142 Type 2 diabetes mellitus with diabetic polyneuropathy: Secondary | ICD-10-CM | POA: Diagnosis not present

## 2017-09-22 DIAGNOSIS — E785 Hyperlipidemia, unspecified: Secondary | ICD-10-CM

## 2017-09-22 DIAGNOSIS — E1165 Type 2 diabetes mellitus with hyperglycemia: Secondary | ICD-10-CM

## 2017-09-22 DIAGNOSIS — E1122 Type 2 diabetes mellitus with diabetic chronic kidney disease: Secondary | ICD-10-CM | POA: Diagnosis not present

## 2017-09-22 DIAGNOSIS — IMO0002 Reserved for concepts with insufficient information to code with codable children: Secondary | ICD-10-CM

## 2017-09-22 DIAGNOSIS — N184 Chronic kidney disease, stage 4 (severe): Secondary | ICD-10-CM

## 2017-09-22 DIAGNOSIS — Z794 Long term (current) use of insulin: Secondary | ICD-10-CM

## 2017-09-22 MED ORDER — INSULIN REGULAR HUMAN 100 UNIT/ML IJ SOLN: 20.0000 [IU] | INTRAMUSCULAR | 3 refills | Status: DC

## 2017-09-22 MED ORDER — INSULIN GLARGINE 300 UNIT/ML ~~LOC~~ SOPN: 25.0000 [IU] | PEN_INJECTOR | Freq: Two times a day (BID) | SUBCUTANEOUS | 3 refills | Status: DC

## 2017-09-22 MED FILL — HumuLIN R 100 UNIT/ML SOLN: 100 | 26 days supply | Qty: 20 | Fill #0

## 2017-09-22 MED FILL — TOUJEO SOLOSTAR 300 UNITS/M: 300 | 27 days supply | Qty: 5 | Fill #0

## 2017-09-22 NOTE — Progress Notes (Signed)
Subjective:     Patient ID: Susan Horton, female   DOB: May 24, 1970, 47 y.o.   MRN: [id number]  Diabetes    Susan Horton is a 47 y.o. woman returning for f/u for DM (unclear if 1 or 2), dx in 1996 when pregnant with her daughter, insulin-dependent since 1999, uncontrolled, with complications (peripheral neuropathy, gastroparesis, DR, CKD).  Last visit for months ago. Now [place].  She has severe gastroparesis and will need a pacemaker.  She is evaluated for kidney transplant, however, she started hemodialysis since last visit (06/2017) -TTS at 12 PM, on [employer].  She feels much better since she started.  Sugars also have improved so her insulin doses have been decreased.  Last HbA1c: Lab Results  Component Value Date   HGBA1C 8.3 (H) 09/14/2017   HGBA1C 8.5 05/23/2017   HGBA1C 10.0 (H) 02/01/2017  07/24/2015: HbA1c 16%!  She was on: Insulin Before breakfast Before lunch Before dinner  Regular 20 20  25   NPH 40  30   + R insulin sliding scale as follows:  - 150- 165: + 1 unit  - 166- 180: + 2 units  - 181- 195: + 3 units  - 196- 210: + 4 units  - 211- 225: + 5 units  - 226- 240: + 6 units  - 241- 255: + 7 units  - 256- 270: + 8 units  - 271- 285: + 9 units  - > 286: + 10 units  Then on: - Toujeo 50 >> 60 units 2x a day - could not remember what dose taking!!!! - R insulin 40 units 3x a day after a meal >> 5-20 units ??? - could not remember what dose taking!!!!   - + Glipizide 10 mg 2x a day - started by PCP?  She is currently on: - Toujeo 60 units 2x a day >> 40 units in a.m. and 80 units at bedtime >> 25 units 2x a day - R insulin: - 20 >> 25 >> 15 units before a smaller meal - 25 >> 30 >> 20  units before a regular meal   She checks her sugars 3 times a day: - am:  low 200s >> upper 100s, 220-250 >> 87, 110-140,170 - mid-morning: 90 (if skips b'fast) - 134 >> n/c - before lunch: 200-300s >> 200s >> 150 >> 150-170 - before dinner:200-300s >> 200s >> 150-200 >>  n/c - before bedtime: 200s-300 >> 150-200 >> 120-130 Lowest: 180 >> 120 >> 87 Highest: 300s >> 400s >> 201  Meals: - Breakfast: toast + egg, fruit - Lunch: sandwich or fruit or most of the time:  fruit smoothie - Dinner: salad, shrimp, scallops, salmon - Snacks: fruit, smoothies, peanuts  - + ESRD. Sees nephrology (Dr. Jimmy Footman). Last BUN/creatinine was: Lab Results  Component Value Date   BUN 104 (H) 06/23/2017   Lab Results  Component Value Date   CREATININE 6.76 (H) 06/23/2017  07/24/2015: 45/2.27, GFR 28 On losartan.  -+ HL; Latest cholesterol level: Lab Results  Component Value Date   CHOL 248 (H) 07/21/2017   HDL 33 (L) 07/21/2017   LDLCALC 165 (H) 07/21/2017   TRIG 248 (H) 07/21/2017   CHOLHDL 7.5 (H) 07/21/2017   07/24/2015: 251/733/25/117 On lovastatin.  - last eye exam: 05/2017: + DR, + macular edema.  She has a history of cataract surgery.  She gets intraocular injections.  She had laser surgery in the right eye.  -+ Tingling/numbness in feet >> sees podiatry, neurology,  now [facility] clinic - now started to use a TENS unit.  She had multiple visits to the ED and admissions for hyperglycemia, gastroparesis exacerbation.  She had a cardiac stent 07/2016.  Review of Systems Constitutional: no weight gain/no weight loss, no fatigue, no subjective hyperthermia, no subjective hypothermia Eyes: no blurry vision, no xerophthalmia ENT: no sore throat, no nodules palpated in throat, no dysphagia, no odynophagia, no hoarseness Cardiovascular: no CP/no SOB/no palpitations/no leg swelling Respiratory: no cough/no SOB/no wheezing Gastrointestinal: no N/no V/no D/no C/no acid reflux Musculoskeletal: no muscle aches/no joint aches Skin: no rashes, no hair loss Neurological: no tremors/+ numbness/+ tingling/no dizziness  I reviewed pt's medications, allergies, PMH, social hx, family hx, and changes were documented in the history of present illness. Otherwise, unchanged from  my initial visit note.  Past Medical History:  Diagnosis Date  . Anemia   . Asthma   . CAD (coronary artery disease)    DES to mid LAD July 2018, residual moderate RCA disease  . Cataract   . CKD (chronic kidney disease) stage 4, GFR 15-29 ml/min (HCC)    Dialysis T/Th/Sa  . DVT (deep venous thrombosis) ([place])    1996 during pregnancy, 2015 left leg  . Essential hypertension 12/11/2015  . Gastroparesis   . GERD (gastroesophageal reflux disease)   . Hidradenitis   . Migraine   . Neuropathy   . Peripheral vascular disease ([place])    blood clot in leg  . Type 2 diabetes mellitus ([place])   . Type II diabetes mellitus ([place])    Past Surgical History:  Procedure Laterality Date  . A/V FISTULAGRAM N/A 06/22/2017   Procedure: A/V FISTULAGRAM - left arm;  Surgeon: [name], MD;  Location: [place] [facility];  Service: Cardiovascular;  Laterality: N/A;  . ABDOMINOPLASTY    . APPENDECTOMY  1995  . AV FISTULA PLACEMENT Left 02/01/2017   Procedure: ARTERIOVENOUS BRACHIOCEPHALIC (AV) FISTULA CREATION;  Surgeon: [name], MD;  Location: [place];  Service: Vascular;  Laterality: Left;  . CHOLECYSTECTOMY  1993  . CORONARY ANGIOPLASTY WITH STENT PLACEMENT    . CORONARY STENT INTERVENTION N/A 08/05/2016   Procedure: Coronary Stent Intervention;  Surgeon: [name], MD;  Location: [place] [facility];  Service: Cardiovascular;  Laterality: N/A;  . EXPLORATORY LAPAROTOMY  08/14/2005   lysis of adhesions, drainage of tubo-ovarian abscess  . FISTULA SUPERFICIALIZATION Left 09/14/2017   Procedure: FISTULA SUPERFICIALIZATION ARTERIOVENOUS FISTULA LEFT ARM;  Surgeon: [name], MD;  Location: [place];  Service: Vascular;  Laterality: Left;  . HYDRADENITIS EXCISION  02/08/2011   Procedure: EXCISION HYDRADENITIS GROIN;  Surgeon: [name], MD;  Location: [place];  Service: General;  Laterality: N/A;  Excisioin of Hidradenitis Left groin  . INGUINAL  HIDRADENITIS EXCISION  07/06/2010   bilateral  . INSERTION OF DIALYSIS CATHETER N/A 09/14/2017   Procedure: INSERTION OF TUNNELED DIALYSIS CATHETER Right Internal Jugular;  Surgeon: [name], MD;  Location: [facility];  Service: Vascular;  Laterality: N/A;  . LEFT HEART CATH AND CORONARY ANGIOGRAPHY N/A 08/05/2016   Procedure: Left Heart Cath and Coronary Angiography;  Surgeon: [name], MD;  Location: [place] [facility];  Service: Cardiovascular;  Laterality: N/A;  . REDUCTION MAMMAPLASTY  2002  . TONSILLECTOMY    . TUBAL LIGATION  1996  . VAGINAL HYSTERECTOMY  08/04/2005   and cysto   Social History   Socioeconomic History  . Marital status: Single  Spouse name: Not on file  . Number of children: 3  . Years of education: 60  . Highest education level: Not on file  Occupational History  . Occupation: Curator  Social Needs  . Financial resource strain: Not on file  . Food insecurity:    Worry: Not on file    Inability: Not on file  . Transportation needs:    Medical: Not on file    Non-medical: Not on file  Tobacco Use  . Smoking status: Former Smoker    Years: 20.00    Types: Cigarettes    Last attempt to quit: 11/2016    Years since quitting: 0.8  . Smokeless tobacco: Never Used  Substance and Sexual Activity  . Alcohol use: No    Alcohol/week: 0.0 standard drinks  . Drug use: No  . Sexual activity: Not on file  Lifestyle  . Physical activity:    Days per week: Not on file    Minutes per session: Not on file  . Stress: Not on file  Relationships  . Social connections:    Talks on phone: Not on file    Gets together: Not on file    Attends religious service: Not on file    Active member of club or organization: Not on file    Attends meetings of clubs or organizations: Not on file    Relationship status: Not on file  . Intimate partner violence:    Fear of current or ex partner: Not on file    Emotionally abused: Not on file     Physically abused: Not on file    Forced sexual activity: Not on file  Other Topics Concern  . Not on file  Social History Narrative   Lives with daughter in a 2 story home.  Has 3 children.  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  Education: college.    Current Outpatient Medications on File Prior to Visit  Medication Sig Dispense Refill  . albuterol (PROVENTIL HFA;VENTOLIN HFA) 108 (90 BASE) MCG/ACT inhaler Inhale 1-2 puffs into the lungs every 6 (six) hours as needed for wheezing or shortness of breath.     [name] aspirin 81 MG chewable tablet Chew 1 tablet (81 mg total) by mouth daily. 30 tablet 0  . capsicum (ZOSTRIX) 0.075 % topical cream Apply 1 application topically 2 (two) times daily. 28.3 g 0  . clopidogrel (PLAVIX) 75 MG tablet Take 1 tablet (75 mg total) by mouth daily with breakfast. 90 tablet 2  . Ferrous Sulfate (IRON) 325 (65 Fe) MG TABS Take 325 mg by mouth daily. 30 each 3  . fluconazole (DIFLUCAN) 150 MG tablet Take 1 tablet (150 mg total) by mouth daily. 2 tablet 0  . Fluticasone-Salmeterol (ADVAIR) 250-50 MCG/DOSE AEPB Inhale 1 puff into the lungs 2 (two) times daily. (Patient taking differently: Inhale 1 puff into the lungs daily as needed (shortness of breath). ) 60 each 3  . Insulin Glargine (TOUJEO SOLOSTAR) 300 UNIT/ML SOPN Inject 15 Units into the skin 2 (two) times daily. (Patient taking differently: Inject 25 Units into the skin 2 (two) times daily. ) 12 pen 0  . insulin regular (HUMULIN R) 100 units/mL injection Use with meals. CBG 121-150: 2 u; CBG 151-200: 3 u; CBG 201-250: 5 u; CBG 251-300: 8 u; CBG 301-350: 11; CBG 351-400: 15 u (Patient taking differently: Inject 2-15 Units into the skin See admin instructions. Use with meals. CBG 121-150: 2 u; CBG 151-200:  3 u; CBG 201-250: 5 u; CBG 251-300: 8 u; CBG 301-350: 11; CBG 351-400: 15 u) 30 mL 5  . lidocaine-prilocaine (EMLA) cream APPLY SMALL AMOUNT TO ACCESS SITE (AVF) 30 MINUTES BEFORE DIALYSIS.  COVER WITH OCCLUSIVE DRESSING (SARAN WRAP) THREE DAYS A WEEK (3 TIMES A  5  . lovastatin (MEVACOR) 20 MG tablet Take 20 mg by mouth at bedtime.     . methocarbamol (ROBAXIN) 500 MG tablet Take 1 tablet (500 mg total) by mouth 2 (two) times daily as needed for muscle spasms. 60 tablet 1  . metoCLOPramide (REGLAN) 5 MG tablet TAKE 1 TABLET BY MOUTH 4 TIMES DAILY BEFORE MEALS AND AT BEDTIME. (Patient taking differently: Take 5 mg by mouth 4 (four) times daily -  before meals and at bedtime. ) 90 tablet 0  . metoprolol succinate (TOPROL-XL) 50 MG 24 hr tablet Take 25 mg by mouth See admin instructions. Take 25mg  by mouth twice daily on Monday, Wednesday, Friday and Sunday  6  . montelukast (SINGULAIR) 10 MG tablet Take 10 mg by mouth daily as needed (allergies).     . multivitamin (RENA-VIT) TABS tablet Take 1 tablet by mouth at bedtime.  0  . nitroGLYCERIN (NITROSTAT) 0.4 MG SL tablet Place 1 tablet (0.4 mg total) under the tongue every 5 (five) minutes as needed. (Patient taking differently: Place 0.4 mg under the tongue every 5 (five) minutes as needed for chest pain. ) 25 tablet 3  . oxyCODONE-acetaminophen (PERCOCET) 5-325 MG tablet Take 1 tablet by mouth every 6 (six) hours as needed for severe pain. 12 tablet 0  . pantoprazole (PROTONIX) 40 MG tablet TAKE 1 TABLET BY MOUTH DAILY. 30 tablet 2  . pregabalin (LYRICA) 50 MG capsule Take 1 capsule (50 mg total) by mouth daily. 30 capsule 1  . senna-docusate (SENOKOT-S) 8.6-50 MG tablet Take 1 tablet by mouth 2 (two) times daily. (Patient taking differently: Take 1 tablet by mouth daily as needed for moderate constipation. )    . trimethoprim-polymyxin b (POLYTRIM) ophthalmic solution INSTILL ONE DROP INTO BOTH EYES 4 TIMES A DAY FOR 2 DAYS AFTER EACH MONTHLY EYE INJECTION  12   No current facility-administered medications on file prior to visit.    Allergies  Allergen Reactions  . Penicillins Anaphylaxis, Itching, Swelling, Rash and Other (See  Comments)    Swelling of throat & whole mouth  Has patient had a PCN reaction causing immediate rash, facial/tongue/throat swelling, SOB or lightheadedness with hypotension: Yes Has patient had a PCN reaction causing severe rash involving mucus membranes or skin necrosis: No Has patient had a PCN reaction that required hospitalization: No Has patient had a PCN reaction occurring within the last 10 years: No If all of the above answers are "NO", then may proceed with Cephalosporin use.  [name] Lisinopril Cough   Family History  Problem Relation Age of Onset  . Hypertension Father   . Hypertension Sister   . Hypertension Brother   . Breast cancer Cousin        x2    Objective:   Physical Exam BP 138/82   Pulse 100   Ht 5\' 6"  (1.676 m)   Wt 166 lb (75.3 kg)   BMI 26.79 kg/m  Body mass index is 26.79 kg/m. Wt Readings from Last 3 Encounters:  09/22/17 166 lb (75.3 kg)  09/07/17 167 lb (75.8 kg)  09/07/17 167 lb (75.8 kg)   Constitutional: overweight, in NAD Eyes: PERRLA, EOMI, no exophthalmos ENT: moist mucous membranes,  no thyromegaly, no cervical lymphadenopathy Cardiovascular: tachycardiaRR, No MRG Respiratory: CTA B Gastrointestinal: abdomen soft, NT, ND, BS+ Musculoskeletal: no deformities, strength intact in all 4 Skin: moist, warm, no rashes Neurological: no tremor with outstretched hands, DTR normal in all 4    Assessment:     1. DM - unclear If type I or type II, insulin-dependent, uncontrolled, with complications - CKD - peripheral neuropathy - DR - gastroparesis 10/06/2005: GES: Clinical Data: 47 year-old, gastroparesis.  NUCLEAR MEDICINE GASTRIC EMPTYING SCAN:  Technique: After oral ingestion of radiolabeled meal, sequential abdominal images were obtained for 120 minutes. Residual percentage of activity remaining within the stomach was calculated at 60 and 120 minutes.  Radiopharmaceutical: 2 mCi Tc-80m sulfur colloid in egg meal.  Findings: Anterior  imaging was performed over the stomach over a 2-hour period. At 60 minutes, close to 100% of the activity remains in the stomach, although there is some activity in the small bowel. At 2 hours, 91% of the activity remained in the stomach.  IMPRESSION:  Marked delayed solid phase gastric emptying consistent with gastroparesis.  Patient has been diagnosed with diabetes when she was 72, during her pregnancy, but it is unclear whether this is type I or type II. She has not have episodes of DKA, which would point more towards type 2 diabetes. I would like to check her C-peptide, however her sugars, and the C-peptide measurement is only valid if the glucose is lower than 200 at the time of the check.  Component     Latest Ref Rng 06/06/2014  Glutamic Acid Decarb Ab     0.0 - 5.0 U/mL <5.0  Pancreatic Islet Cell Antibody     Neg:<1:1 Negative   2. Diabetic peripheral neuropathy  3. HL  Plan:     1. DM - Pt with poorly controlled diabetes due to noncompliance with sugar checks, insulin doses, appointments, and also secondary to her uncontrolled gastroparesis.  She is doing a better job now with appointments.  Her last appointment was 4 months ago.  Her HbA1c continues to improve.  Last was 8.3% obtained earlier this month. -At last visit, sugars were high, but improved.  They were higher in the morning so we increase the Toujeo dose at night and decreased it in the morning.  Unfortunately, we cannot work with other diabetic medicines due to her GI problems and also her end-stage renal disease. -Since last visit she started hemodialysis.  Sugar started to improve and her insulin doses have been decreased.  Her sugars have improved, but they are not quite at goal.  We will increase her R insulin with larger meals, but I do not feel other changes are necessary for now.  She continues to work on her diet. - I advised her to: Patient Instructions  Please continue: - Toujeo 25 units in am and 25 units  at bedtime  Please increase: - R insulin 20-25 units before meals  Please return in 3 months with your sugar log.    - continue checking sugars at different times of the day - check 3x a day, rotating checks - advised for yearly eye exams >> she is UTD - Return to clinic in 3 mo with sugar log     2. Diabetic peripheral neuropathy -Continues to see neurology and now pain clinic.  Started a TENS unit.  She is not sure if this is helping yet. -On Cymbalta and Neurontin, but still has breakthrough pain and numbness - ALA + B complex  did not work. Cannot use Lyrica 2/2 CKD.  3. HL - Reviewed latest lipid panel from 2 months ago: All fractions abnormal, with high LDL Lab Results  Component Value Date   CHOL 248 (H) 07/21/2017   HDL 33 (L) 07/21/2017   LDLCALC 165 (H) 07/21/2017   TRIG 248 (H) 07/21/2017   CHOLHDL 7.5 (H) 07/21/2017  - Continues the statin without side effects. -I expect this will improve with improvement of her diabetes control.  Philemon Kingdom, MD PhD [facility] Endocrinology

## 2017-09-22 NOTE — Patient Instructions (Addendum)
Please continue: - Toujeo 25 units in am and 25 units at bedtime  Please increase: - R insulin 20-25 units before meals  Please return in 3 months with your sugar log.

## 2017-09-30 ENCOUNTER — Other Ambulatory Visit: Payer: Self-pay | Admitting: Pharmacist

## 2017-09-30 MED ORDER — EVOLOCUMAB 140 MG/ML ~~LOC~~ SOAJ: 140.0000 mg | SUBCUTANEOUS | 11 refills | Status: DC

## 2017-09-30 MED FILL — REPATHA 140 MG/ML SURECLICK: 140 | 30 days supply | Qty: 2 | Fill #0

## 2017-09-30 NOTE — Telephone Encounter (Signed)
PA approved by Medicaid. [facility]

## 2017-10-05 ENCOUNTER — Encounter: Payer: Medicaid Other | Attending: Physical Medicine & Rehabilitation | Admitting: Physical Medicine & Rehabilitation

## 2017-10-05 ENCOUNTER — Other Ambulatory Visit: Payer: Self-pay

## 2017-10-05 ENCOUNTER — Encounter: Payer: Self-pay | Admitting: Physical Medicine & Rehabilitation

## 2017-10-05 VITALS — BP 176/83 | HR 101 | Ht 65.0 in | Wt 166.0 lb

## 2017-10-05 DIAGNOSIS — J45909 Unspecified asthma, uncomplicated: Secondary | ICD-10-CM | POA: Diagnosis not present

## 2017-10-05 DIAGNOSIS — R269 Unspecified abnormalities of gait and mobility: Secondary | ICD-10-CM | POA: Diagnosis not present

## 2017-10-05 DIAGNOSIS — R531 Weakness: Secondary | ICD-10-CM | POA: Insufficient documentation

## 2017-10-05 DIAGNOSIS — E1143 Type 2 diabetes mellitus with diabetic autonomic (poly)neuropathy: Secondary | ICD-10-CM | POA: Insufficient documentation

## 2017-10-05 DIAGNOSIS — G479 Sleep disorder, unspecified: Secondary | ICD-10-CM | POA: Diagnosis not present

## 2017-10-05 DIAGNOSIS — Z992 Dependence on renal dialysis: Secondary | ICD-10-CM | POA: Diagnosis not present

## 2017-10-05 DIAGNOSIS — K219 Gastro-esophageal reflux disease without esophagitis: Secondary | ICD-10-CM | POA: Insufficient documentation

## 2017-10-05 DIAGNOSIS — I120 Hypertensive chronic kidney disease with stage 5 chronic kidney disease or end stage renal disease: Secondary | ICD-10-CM | POA: Insufficient documentation

## 2017-10-05 DIAGNOSIS — E1142 Type 2 diabetes mellitus with diabetic polyneuropathy: Secondary | ICD-10-CM | POA: Diagnosis not present

## 2017-10-05 DIAGNOSIS — E1122 Type 2 diabetes mellitus with diabetic chronic kidney disease: Secondary | ICD-10-CM | POA: Insufficient documentation

## 2017-10-05 DIAGNOSIS — N186 End stage renal disease: Secondary | ICD-10-CM | POA: Diagnosis not present

## 2017-10-05 DIAGNOSIS — Z86718 Personal history of other venous thrombosis and embolism: Secondary | ICD-10-CM | POA: Diagnosis not present

## 2017-10-05 DIAGNOSIS — I2510 Atherosclerotic heart disease of native coronary artery without angina pectoris: Secondary | ICD-10-CM | POA: Diagnosis not present

## 2017-10-05 DIAGNOSIS — Z87891 Personal history of nicotine dependence: Secondary | ICD-10-CM | POA: Insufficient documentation

## 2017-10-05 MED ORDER — LIDOCAINE 5 % EX OINT: 1.0000 "application " | TOPICAL_OINTMENT | CUTANEOUS | 0 refills | Status: DC | PRN

## 2017-10-05 MED ORDER — PREGABALIN 75 MG PO CAPS: 75.0000 mg | ORAL_CAPSULE | Freq: Two times a day (BID) | ORAL | 1 refills | Status: DC

## 2017-10-05 MED FILL — LIDOCAINE 5 % OINT: 5 | 15 days supply | Qty: 35 | Fill #0

## 2017-10-05 NOTE — Progress Notes (Addendum)
Subjective:    Patient ID: Susan Horton, female    DOB: Sep 03, 1970, 47 y.o.   MRN: [id number]  HPI 47 y/o female with pmh of DM with neuropathy, ESRD on HD, CAD, asthma presents with diabetic neuropathy.   Initially stated: Started ~2000, progressively worse in 2017.  B/l feet up to knees and hands.  Denies inciting event.  Burning/tingling.  Constant. Denies alleviating factors.  Ambulation exacerbates pain.  Associated numbness and weakness. Epson, hot/cold do not help.  Denies falls in last year. Pain limits ADLs.  Last clinic visit 09/07/17.  Since that time, pt had procedures for HD access.  Her BP is elevated, but she states she forgot to take her medications. She states she does not like the TENS states she feels it may aggravate her symptoms. Robaxin helps, but causes sedation. No benefit with Lyrica. No benefit with Capsacin cream.  She has an appointment for diabetic shoes.  Denies falls. She has not done anything to lose weight.   Pain Inventory Average Pain 10 Pain Right Now 10 My pain is burning, tingling and aching  In the last 24 hours, has pain interfered with the following? General activity 8 Relation with others 10 Enjoyment of life 10 What TIME of day is your pain at its worst? all Sleep (in general) Good  Pain is worse with: walking, sitting, inactivity and standing Pain improves with: none Relief from Meds: no meds  Mobility use a cane ability to climb steps?  yes do you drive?  yes  Function disabled: date disabled July 2017 I need assistance with the following:  household duties  Neuro/Psych No problems in this area  Prior Studies Any changes since last visit?  no  Physicians involved in your care Any changes since last visit?  no   Family History  Problem Relation Age of Onset  . Hypertension Father   . Hypertension Sister   . Hypertension Brother   . Breast cancer Cousin        x2   Social History   Socioeconomic History  . Marital  status: Single    Spouse name: Not on file  . Number of children: 3  . Years of education: 67  . Highest education level: Not on file  Occupational History  . Occupation: Curator  Social Needs  . Financial resource strain: Not on file  . Food insecurity:    Worry: Not on file    Inability: Not on file  . Transportation needs:    Medical: Not on file    Non-medical: Not on file  Tobacco Use  . Smoking status: Former Smoker    Years: 20.00    Types: Cigarettes    Last attempt to quit: 11/2016    Years since quitting: 0.8  . Smokeless tobacco: Never Used  Substance and Sexual Activity  . Alcohol use: No    Alcohol/week: 0.0 standard drinks  . Drug use: No  . Sexual activity: Not on file  Lifestyle  . Physical activity:    Days per week: Not on file    Minutes per session: Not on file  . Stress: Not on file  Relationships  . Social connections:    Talks on phone: Not on file    Gets together: Not on file    Attends religious service: Not on file    Active member of club or organization: Not on file    Attends meetings of clubs or organizations: Not on file  Relationship status: Not on file  Other Topics Concern  . Not on file  Social History Narrative   Lives with daughter in a 2 story home.  Has 3 children.  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  Education: college.    Past Surgical History:  Procedure Laterality Date  . A/V FISTULAGRAM N/A 06/22/2017   Procedure: A/V FISTULAGRAM - left arm;  Surgeon: [name], MD;  Location: [place] [facility];  Service: Cardiovascular;  Laterality: N/A;  . ABDOMINOPLASTY    . APPENDECTOMY  1995  . AV FISTULA PLACEMENT Left 02/01/2017   Procedure: ARTERIOVENOUS BRACHIOCEPHALIC (AV) FISTULA CREATION;  Surgeon: [name], MD;  Location: [place];  Service: Vascular;  Laterality: Left;  . CHOLECYSTECTOMY  1993  . CORONARY ANGIOPLASTY WITH STENT PLACEMENT    . CORONARY STENT  INTERVENTION N/A 08/05/2016   Procedure: Coronary Stent Intervention;  Surgeon: [name], MD;  Location: [place] [facility];  Service: Cardiovascular;  Laterality: N/A;  . EXPLORATORY LAPAROTOMY  08/14/2005   lysis of adhesions, drainage of tubo-ovarian abscess  . FISTULA SUPERFICIALIZATION Left 09/14/2017   Procedure: FISTULA SUPERFICIALIZATION ARTERIOVENOUS FISTULA LEFT ARM;  Surgeon: [name], MD;  Location: [place];  Service: Vascular;  Laterality: Left;  . HYDRADENITIS EXCISION  02/08/2011   Procedure: EXCISION HYDRADENITIS GROIN;  Surgeon: [name], MD;  Location: [place];  Service: General;  Laterality: N/A;  Excisioin of Hidradenitis Left groin  . INGUINAL HIDRADENITIS EXCISION  07/06/2010   bilateral  . INSERTION OF DIALYSIS CATHETER N/A 09/14/2017   Procedure: INSERTION OF TUNNELED DIALYSIS CATHETER Right Internal Jugular;  Surgeon: [name], MD;  Location: [place];  Service: Vascular;  Laterality: N/A;  . LEFT HEART CATH AND CORONARY ANGIOGRAPHY N/A 08/05/2016   Procedure: Left Heart Cath and Coronary Angiography;  Surgeon: [name], MD;  Location: [place] [facility];  Service: Cardiovascular;  Laterality: N/A;  . REDUCTION MAMMAPLASTY  2002  . TONSILLECTOMY    . TUBAL LIGATION  1996  . VAGINAL HYSTERECTOMY  08/04/2005   and cysto   Past Medical History:  Diagnosis Date  . Anemia   . Asthma   . CAD (coronary artery disease)    DES to mid LAD July 2018, residual moderate RCA disease  . Cataract   . CKD (chronic kidney disease) stage 4, GFR 15-29 ml/min (HCC)    Dialysis T/Th/Sa  . DVT (deep venous thrombosis) ([place])    1996 during pregnancy, 2015 left leg  . Essential hypertension 12/11/2015  . Gastroparesis   . GERD (gastroesophageal reflux disease)   . Hidradenitis   . Migraine   . Neuropathy   . Peripheral vascular disease ([place])    blood clot in leg  . Type 2 diabetes mellitus ([place])   . Type II diabetes  mellitus (HCC)    BP (!) 176/83   Pulse (!) 101   Ht 5\' 5"  (1.651 m)   Wt 166 lb (75.3 kg)   BMI 27.62 kg/m   Opioid Risk Score:   Fall Risk Score:  `1  Depression screen PHQ 2/9  Depression screen [facility] 2/9 10/05/2017 09/07/2017 06/10/2017 03/10/2016 01/21/2016 12/11/2015 11/25/2015  Decreased Interest 0 0 1 2 2 1 1   Down, Depressed, Hopeless 0 0 1 1 1 1 2   PHQ - 2 Score 0 0 2 3 3 2 3   Altered sleeping - 1 3 1 3 2 3   Tired, decreased  energy - 2 3 2 2 3 3   Change in appetite - 0 3 1 2 3 3   Feeling bad or failure about yourself  - 0 1 1 1  0 0  Trouble concentrating - 0 1 2 0 1 1  Moving slowly or fidgety/restless - 0 0 0 0 1 1  Suicidal thoughts - 0 0 0 0 0 0  PHQ-9 Score - 3 13 10 11 12 14   Difficult doing work/chores - Somewhat difficult - - - - -  Some recent data might be hidden     Review of Systems  Constitutional: Negative.   HENT: Negative.   Eyes: Negative.   Respiratory: Negative.   Cardiovascular: Negative.   Gastrointestinal: Negative.   Endocrine:       Diabetic   Genitourinary: Negative.   Musculoskeletal: Positive for gait problem and myalgias.  Skin: Negative.   Allergic/Immunologic: Negative.   Neurological: Positive for weakness and numbness.  Hematological: Negative.   Psychiatric/Behavioral: Negative.   All other systems reviewed and are negative.     Objective:   Physical Exam Gen: NAD. Vital signs reviewed HENT: Normocephalic, Atraumatic Eyes: EOMI. No discharge.  Cardio: Regular rhythm. Tachycardia. No JVD. Pulm: B/l clear to auscultation.  Effort normal. Abd: Nondistended, BS+ MSK:  Gait mildly antalgic.   TTP b/l LE.    No edema.  Neuro:  Sensation diminished to light touch in distal b/l knees with hypersensitivity  Strength  4/5 in all LE myotomes (pain inhibition) Skin: Warm and Dry. Intact    Assessment & Plan:  47 y/o female with pmh of DM with neuropathy, ESRD on HD, CAD, asthma presents with diabetic neuropathy.    1.  Diabetic polyneuropathy  Lidoderm, Gabapentin, TENS, Flexaril, Epson salt baths ineffective  Lidocaine ointment ordered  Cont Cymbalta  Robaxin 250 during day and 750 qhs PRN, muscle pain  Will increase Lyrica 75 daily  Cont Capsacin cream, some benefit  Will consider referral for baths  Will consider compound medications  Will consider Anodyne therapy  Encouraged follow up for diabetic shoes, pt has appointment  Discussed expectations and coping with certain degree of discomfort  2. Gait abnormality  Encouraged use of cane  3. Sleep disturbance  No benefit with Pamelor, will d/c  See #1  4. Morbid Obesity  Cont to follow up with dietitian  Encouraged weight loss

## 2017-10-11 ENCOUNTER — Ambulatory Visit (INDEPENDENT_AMBULATORY_CARE_PROVIDER_SITE_OTHER): Payer: Self-pay | Admitting: Vascular Surgery

## 2017-10-11 ENCOUNTER — Encounter: Payer: Self-pay | Admitting: Vascular Surgery

## 2017-10-11 ENCOUNTER — Other Ambulatory Visit: Payer: Self-pay

## 2017-10-11 VITALS — BP 195/102 | HR 97 | Temp 98.2°F | Resp 18 | Ht 65.0 in | Wt 167.0 lb

## 2017-10-11 DIAGNOSIS — N186 End stage renal disease: Secondary | ICD-10-CM | POA: Insufficient documentation

## 2017-10-11 DIAGNOSIS — Z992 Dependence on renal dialysis: Secondary | ICD-10-CM

## 2017-10-11 NOTE — Progress Notes (Signed)
Patient name: Susan Horton MRN: [id number] DOB: 1970/04/22 Sex: female  REASON FOR VISIT: Postop check status post right IJ tunneled dialysis catheter placement and superficialization of her left brachiocephalic fistula  HPI: Susan Horton is a 47 y.o. female with end-stage renal disease that presents for follow-up after recent right IJ tunneled dialysis catheter placement and superficialization of her left brachiocephalic fistula on 03/13/8248.  Patient states that the incision in her left arm has healed without any issues.  She continues to complain of numbness in her left hand particularly worse during dialysis even though they are using her catheter and not the fistula.  It should be noted that the numbness in her left hand has been ongoing since her fistula was placed back in January by Dr. [name].  Previously discussed with patient options for ligation versus evaluating access in the other arm by Dr. [name] and she did not wanted her access ligated.  Still no motor deficit on exam.  Past Medical History:  Diagnosis Date  . Anemia   . Asthma   . CAD (coronary artery disease)    DES to mid LAD July 2018, residual moderate RCA disease  . Cataract   . CKD (chronic kidney disease) stage 4, GFR 15-29 ml/min (HCC)    Dialysis T/Th/Sa  . DVT (deep venous thrombosis) ([place])    1996 during pregnancy, 2015 left leg  . Essential hypertension 12/11/2015  . Gastroparesis   . GERD (gastroesophageal reflux disease)   . Hidradenitis   . Migraine   . Neuropathy   . Peripheral vascular disease ([place])    blood clot in leg  . Type 2 diabetes mellitus ([place])   . Type II diabetes mellitus ([place])     Past Surgical History:  Procedure Laterality Date  . A/V FISTULAGRAM N/A 06/22/2017   Procedure: A/V FISTULAGRAM - left arm;  Surgeon: [name], MD;  Location: [place] [facility];  Service: Cardiovascular;  Laterality: N/A;  . ABDOMINOPLASTY    . APPENDECTOMY  1995  . AV FISTULA PLACEMENT  Left 02/01/2017   Procedure: ARTERIOVENOUS BRACHIOCEPHALIC (AV) FISTULA CREATION;  Surgeon: [name], MD;  Location: [place];  Service: Vascular;  Laterality: Left;  . CHOLECYSTECTOMY  1993  . CORONARY ANGIOPLASTY WITH STENT PLACEMENT    . CORONARY STENT INTERVENTION N/A 08/05/2016   Procedure: Coronary Stent Intervention;  Surgeon: [name], MD;  Location: [place] [facility];  Service: Cardiovascular;  Laterality: N/A;  . EXPLORATORY LAPAROTOMY  08/14/2005   lysis of adhesions, drainage of tubo-ovarian abscess  . FISTULA SUPERFICIALIZATION Left 09/14/2017   Procedure: FISTULA SUPERFICIALIZATION ARTERIOVENOUS FISTULA LEFT ARM;  Surgeon: [name], MD;  Location: [place];  Service: Vascular;  Laterality: Left;  . HYDRADENITIS EXCISION  02/08/2011   Procedure: EXCISION HYDRADENITIS GROIN;  Surgeon: [name], MD;  Location: [place];  Service: General;  Laterality: N/A;  Excisioin of Hidradenitis Left groin  . INGUINAL HIDRADENITIS EXCISION  07/06/2010   bilateral  . INSERTION OF DIALYSIS CATHETER N/A 09/14/2017   Procedure: INSERTION OF TUNNELED DIALYSIS CATHETER Right Internal Jugular;  Surgeon: [name], MD;  Location: [facility];  Service: Vascular;  Laterality: N/A;  . LEFT HEART CATH AND CORONARY ANGIOGRAPHY N/A 08/05/2016   Procedure: Left Heart Cath and Coronary Angiography;  Surgeon: [name], MD;  Location: [place] [facility];  Service: Cardiovascular;  Laterality: N/A;  . REDUCTION MAMMAPLASTY  2002  . TONSILLECTOMY    .  TUBAL LIGATION  1996  . VAGINAL HYSTERECTOMY  08/04/2005   and cysto    Family History  Problem Relation Age of Onset  . Hypertension Father   . Hypertension Sister   . Hypertension Brother   . Breast cancer Cousin        x2    SOCIAL HISTORY: Social History   Tobacco Use  . Smoking status: Former Smoker    Years: 20.00    Types: Cigarettes    Last attempt to quit: 11/2016    Years since  quitting: 0.9  . Smokeless tobacco: Never Used  Substance Use Topics  . Alcohol use: No    Alcohol/week: 0.0 standard drinks    Allergies  Allergen Reactions  . Penicillins Anaphylaxis, Itching, Swelling, Rash and Other (See Comments)    Swelling of throat & whole mouth  Has patient had a PCN reaction causing immediate rash, facial/tongue/throat swelling, SOB or lightheadedness with hypotension: Yes Has patient had a PCN reaction causing severe rash involving mucus membranes or skin necrosis: No Has patient had a PCN reaction that required hospitalization: No Has patient had a PCN reaction occurring within the last 10 years: No If all of the above answers are "NO", then may proceed with Cephalosporin use.  [name] Lisinopril Cough    Current Outpatient Medications  Medication Sig Dispense Refill  . albuterol (PROVENTIL HFA;VENTOLIN HFA) 108 (90 BASE) MCG/ACT inhaler Inhale 1-2 puffs into the lungs every 6 (six) hours as needed for wheezing or shortness of breath.     [name] aspirin 81 MG chewable tablet Chew 1 tablet (81 mg total) by mouth daily. 30 tablet 0  . capsicum (ZOSTRIX) 0.075 % topical cream Apply 1 application topically 2 (two) times daily. 28.3 g 0  . clopidogrel (PLAVIX) 75 MG tablet Take 1 tablet (75 mg total) by mouth daily with breakfast. 90 tablet 2  . Evolocumab (REPATHA SURECLICK) 315 MG/ML SOAJ Inject 140 mg into the skin every 14 (fourteen) days. 2 pen 11  . Ferrous Sulfate (IRON) 325 (65 Fe) MG TABS Take 325 mg by mouth daily. 30 each 3  . fluconazole (DIFLUCAN) 150 MG tablet Take 1 tablet (150 mg total) by mouth daily. 2 tablet 0  . Fluticasone-Salmeterol (ADVAIR) 250-50 MCG/DOSE AEPB Inhale 1 puff into the lungs 2 (two) times daily. (Patient taking differently: Inhale 1 puff into the lungs daily as needed (shortness of breath). ) 60 each 3  . Insulin Glargine (TOUJEO SOLOSTAR) 300 UNIT/ML SOPN Inject 25 Units into the skin 2 (two) times daily. 45 mL 3  . insulin regular  (HUMULIN R) 100 units/mL injection Inject 0.2-0.25 mLs (20-25 Units total) into the skin See admin instructions. 45 mL 3  . lidocaine (XYLOCAINE) 5 % ointment Apply 1 application topically as needed. 35.44 g 0  . lidocaine-prilocaine (EMLA) cream APPLY SMALL AMOUNT TO ACCESS SITE (AVF) 30 MINUTES BEFORE DIALYSIS. COVER WITH OCCLUSIVE DRESSING (SARAN WRAP) THREE DAYS A WEEK (3 TIMES A  5  . lovastatin (MEVACOR) 20 MG tablet Take 20 mg by mouth at bedtime.     . methocarbamol (ROBAXIN) 500 MG tablet Take 1 tablet (500 mg total) by mouth 2 (two) times daily as needed for muscle spasms. 60 tablet 1  . metoCLOPramide (REGLAN) 5 MG tablet TAKE 1 TABLET BY MOUTH 4 TIMES DAILY BEFORE MEALS AND AT BEDTIME. (Patient taking differently: Take 5 mg by mouth 4 (four) times daily -  before meals and at bedtime. ) 90 tablet 0  .  metoprolol succinate (TOPROL-XL) 50 MG 24 hr tablet Take 25 mg by mouth See admin instructions. Take 25mg  by mouth twice daily on Monday, Wednesday, Friday and Sunday  6  . montelukast (SINGULAIR) 10 MG tablet Take 10 mg by mouth daily as needed (allergies).     . multivitamin (RENA-VIT) TABS tablet Take 1 tablet by mouth at bedtime.  0  . nitroGLYCERIN (NITROSTAT) 0.4 MG SL tablet Place 1 tablet (0.4 mg total) under the tongue every 5 (five) minutes as needed. (Patient taking differently: Place 0.4 mg under the tongue every 5 (five) minutes as needed for chest pain. ) 25 tablet 3  . oxyCODONE-acetaminophen (PERCOCET) 5-325 MG tablet Take 1 tablet by mouth every 6 (six) hours as needed for severe pain. 12 tablet 0  . pantoprazole (PROTONIX) 40 MG tablet TAKE 1 TABLET BY MOUTH DAILY. 30 tablet 2  . pregabalin (LYRICA) 75 MG capsule Take 1 capsule (75 mg total) by mouth 2 (two) times daily. 30 capsule 1  . senna-docusate (SENOKOT-S) 8.6-50 MG tablet Take 1 tablet by mouth 2 (two) times daily. (Patient taking differently: Take 1 tablet by mouth daily as needed for moderate constipation. )    .  trimethoprim-polymyxin b (POLYTRIM) ophthalmic solution INSTILL ONE DROP INTO BOTH EYES 4 TIMES A DAY FOR 2 DAYS AFTER EACH MONTHLY EYE INJECTION  12   No current facility-administered medications for this visit.     REVIEW OF SYSTEMS:  [X]  denotes positive finding, [ ]  denotes negative finding Cardiac  Comments:  Chest pain or chest pressure:    Shortness of breath upon exertion:    Short of breath when lying flat:    Irregular heart rhythm:        Vascular    Pain in calf, thigh, or hip brought on by ambulation:    Pain in feet at night that wakes you up from your sleep:     Blood clot in your veins:    Leg swelling:         Pulmonary    Oxygen at home:    Productive cough:     Wheezing:         Neurologic    Sudden weakness in arms or legs:     Sudden numbness in arms or legs:     Sudden onset of difficulty speaking or slurred speech:    Temporary loss of vision in one eye:     Problems with dizziness:     Numbness in left hand x   Gastrointestinal    Blood in stool:     Vomited blood:         Genitourinary    Burning when urinating:     Blood in urine:        Psychiatric    Major depression:         Hematologic    Bleeding problems:    Problems with blood clotting too easily:        Skin    Rashes or ulcers:        Constitutional    Fever or chills:      PHYSICAL EXAM: Vitals:   10/11/17 0902  BP: (!) 195/102  Pulse: 97  Resp: 18  Temp: 98.2 F (36.8 C)  TempSrc: Oral  SpO2: 98%  Weight: 75.8 kg  Height: 5\' 5"  (1.651 m)    GENERAL: The patient is a well-nourished female, in no acute distress. The vital signs are documented above. CARDIAC: There is a  regular rate and rhythm.  VASCULAR:  Left brachiocephalic fistula with good thrill Incisions to left arm healed Tunneled RIJ catheter - no drainage or redness PULMONARY: There is good air exchange bilaterally without wheezing or rales. MUSCULOSKELETAL: There are no major deformities or  cyanosis. NEUROLOGIC: No focal weakness on exam.  Good grip strength in left hand. SKIN: There are no ulcers or rashes noted. PSYCHIATRIC: The patient has a normal affect.  DATA:   No imaging to review  Assessment/Plan:  47 year old female status post right IJ tunneled dialysis catheter placement and left brachiocephalic AV fistula superficialization.  Overall the incisions in her left arm have healed and she has a great thrill in the fistula.  I discussed with the patient that I would be fine with them accessing the fistula to attempt hemodialysis in another month or so.  No plans to remove her catheter at this time given that she is catheter dependent until her fistula is ready for use.  She has some component of steal in her left hand that has been ongoing since January when the fistula was placed.  This remains solely a sensory deficit and she is motor intact.  She's had multiple previous discussions with Dr. [name] regarding evaluating access in her other arm which she has wanted to avoid.  At this time she is pursuing options for peritoneal dialysis.  The plan we arranged today is to proceed with PD evaluation and if that becomes an option we can remove her catheter in the future and may keep her brachiocephalic fistula as a back-up.  This may work since the symptoms in her left hand are worse during dialysis.  I will see her back in 3 months to re-evaluate options.  She feels the symptoms are tolerable for now.   [name], MD [facility] Office: [phone] Pager: [place]

## 2017-10-17 ENCOUNTER — Ambulatory Visit: Payer: Medicaid Other | Admitting: Adult Health

## 2017-10-17 ENCOUNTER — Telehealth: Payer: Self-pay | Admitting: Cardiovascular Disease

## 2017-10-17 NOTE — Telephone Encounter (Signed)
New Message   Patient is calling because when she goes to dialysis she experiences chest pain and has to be placed on oxygen. But it it only occurs when she is goes for dialysis. She says they advised her to contact her cardiologist. Please call to discuss.

## 2017-10-17 NOTE — Telephone Encounter (Signed)
Spoke with patient and she has chest pains during dialysis. Offered appointment today however she has another appointment. Scheduled appointment for Wednesday with [facility] and advised to go to ED if returns without subsiding.

## 2017-10-19 ENCOUNTER — Ambulatory Visit (INDEPENDENT_AMBULATORY_CARE_PROVIDER_SITE_OTHER): Payer: Self-pay | Admitting: Cardiology

## 2017-10-19 ENCOUNTER — Encounter: Payer: Self-pay | Admitting: Cardiology

## 2017-10-19 DIAGNOSIS — Z9861 Coronary angioplasty status: Secondary | ICD-10-CM

## 2017-10-19 DIAGNOSIS — I10 Essential (primary) hypertension: Secondary | ICD-10-CM

## 2017-10-19 DIAGNOSIS — Z794 Long term (current) use of insulin: Secondary | ICD-10-CM

## 2017-10-19 DIAGNOSIS — R0989 Other specified symptoms and signs involving the circulatory and respiratory systems: Secondary | ICD-10-CM

## 2017-10-19 DIAGNOSIS — N186 End stage renal disease: Secondary | ICD-10-CM

## 2017-10-19 DIAGNOSIS — I2510 Atherosclerotic heart disease of native coronary artery without angina pectoris: Secondary | ICD-10-CM

## 2017-10-19 DIAGNOSIS — I259 Chronic ischemic heart disease, unspecified: Secondary | ICD-10-CM

## 2017-10-19 DIAGNOSIS — Z992 Dependence on renal dialysis: Secondary | ICD-10-CM

## 2017-10-19 DIAGNOSIS — E118 Type 2 diabetes mellitus with unspecified complications: Secondary | ICD-10-CM

## 2017-10-19 MED ORDER — METOPROLOL SUCCINATE ER 50 MG PO TB24: 50.0000 mg | ORAL_TABLET | ORAL | 3 refills | Status: DC

## 2017-10-19 MED ORDER — ISOSORBIDE MONONITRATE ER 30 MG PO TB24: 15.0000 mg | ORAL_TABLET | Freq: Every day | ORAL | 3 refills | Status: DC

## 2017-10-19 MED ORDER — NITROGLYCERIN 0.4 MG SL SUBL: 0.4000 mg | SUBLINGUAL_TABLET | SUBLINGUAL | 2 refills | Status: DC | PRN

## 2017-10-19 MED ORDER — CLOPIDOGREL BISULFATE 75 MG PO TABS: 75.0000 mg | ORAL_TABLET | Freq: Every day | ORAL | 3 refills | Status: DC

## 2017-10-19 MED FILL — METOPROLOL SUCCINATE ER 50: 50 | 28 days supply | Qty: 22 | Fill #0

## 2017-10-19 MED FILL — ISOSORBIDE MN ER 30 MG TAB: 30 | 30 days supply | Qty: 15 | Fill #0

## 2017-10-19 MED FILL — CLOPIDOGREL 75 MG TABLET: 75 | 30 days supply | Qty: 30 | Fill #0

## 2017-10-19 MED FILL — NITROSTAT 0.4 MG TABLET SL: 0.4 | 25 days supply | Qty: 25 | Fill #0

## 2017-10-19 NOTE — Assessment & Plan Note (Signed)
Fair control, h/o occasional low B/P on HD

## 2017-10-19 NOTE — Assessment & Plan Note (Signed)
IDDM with renal failure

## 2017-10-19 NOTE — Progress Notes (Signed)
10/19/2017 Susan Horton   [birth year]  [id number]  Primary Physician Charlott Rakes, MD Primary Cardiologist: *Dr Gwenlyn Found  HPI:  Pleasant 47 y/o AA female with a  a history of treated hypertension, hyperlipidemia, diabetes which she's had for the last 22 years, and now ESRD on HD T Th Sat, followed by Dr. Jimmy Footman. She was seen for chest pain June 2018. She had a Myoview stress test was read as intermediate risk with inferior ischemia. Based on this, Dr Gwenlyn Found decided to proceed with outpatient cath. This was performed 08/05/16 via the right femoral approach. Cath revealed an 80% mid LAD which was stented using a synergy drug-eluting stent reducing 80% stenosis to 0% residual. She did have moderate mid RCA stenosis which was not addressed. She had left main spasm probably related to the guide catheter during the procedure that responded to intra-coronary nitroglycerin. She improved symptomatically and she has stopped smoking since. She did have carotid Dopplers performed because of bruits that showed mild to moderate left ICA stenosis which will be followed on an annual basis.  She I in the office today for a check up. She relates that she gets chest " tightness" while on HD. She does not have exertional chest pain. She has not used NTG. Her B/P is stable but does get low sometimes on HD per the patient.    Current Outpatient Medications  Medication Sig Dispense Refill  . albuterol (PROVENTIL HFA;VENTOLIN HFA) 108 (90 BASE) MCG/ACT inhaler Inhale 1-2 puffs into the lungs every 6 (six) hours as needed for wheezing or shortness of breath.     [name] aspirin 81 MG chewable tablet Chew 1 tablet (81 mg total) by mouth daily. 30 tablet 0  . capsicum (ZOSTRIX) 0.075 % topical cream Apply 1 application topically 2 (two) times daily. 28.3 g 0  . clopidogrel (PLAVIX) 75 MG tablet Take 1 tablet (75 mg total) by mouth daily with breakfast. 90 tablet 3  . Evolocumab (REPATHA SURECLICK) 025 MG/ML SOAJ Inject 140 mg  into the skin every 14 (fourteen) days. 2 pen 11  . Ferrous Sulfate (IRON) 325 (65 Fe) MG TABS Take 325 mg by mouth daily. 30 each 3  . fluconazole (DIFLUCAN) 150 MG tablet Take 1 tablet (150 mg total) by mouth daily. 2 tablet 0  . Fluticasone-Salmeterol (ADVAIR) 250-50 MCG/DOSE AEPB Inhale 1 puff into the lungs 2 (two) times daily. (Patient taking differently: Inhale 1 puff into the lungs daily as needed (shortness of breath). ) 60 each 3  . Insulin Glargine (TOUJEO SOLOSTAR) 300 UNIT/ML SOPN Inject 25 Units into the skin 2 (two) times daily. 45 mL 3  . insulin regular (HUMULIN R) 100 units/mL injection Inject 0.2-0.25 mLs (20-25 Units total) into the skin See admin instructions. 45 mL 3  . lidocaine (XYLOCAINE) 5 % ointment Apply 1 application topically as needed. 35.44 g 0  . lidocaine-prilocaine (EMLA) cream APPLY SMALL AMOUNT TO ACCESS SITE (AVF) 30 MINUTES BEFORE DIALYSIS. COVER WITH OCCLUSIVE DRESSING (SARAN WRAP) THREE DAYS A WEEK (3 TIMES A  5  . lovastatin (MEVACOR) 20 MG tablet Take 20 mg by mouth at bedtime.     . methocarbamol (ROBAXIN) 500 MG tablet Take 1 tablet (500 mg total) by mouth 2 (two) times daily as needed for muscle spasms. 60 tablet 1  . metoCLOPramide (REGLAN) 5 MG tablet TAKE 1 TABLET BY MOUTH 4 TIMES DAILY BEFORE MEALS AND AT BEDTIME. (Patient taking differently: Take 5 mg by mouth 4 (four) times daily -  before meals and at bedtime. ) 90 tablet 0  . metoprolol succinate (TOPROL-XL) 50 MG 24 hr tablet Take 1 tablet (50 mg total) by mouth See admin instructions. Take 25 mg on Tuesday ,Thursday and Saturday, and 50 mg on M,W, F, Sunday 90 tablet 3  . montelukast (SINGULAIR) 10 MG tablet Take 10 mg by mouth daily as needed (allergies).     . multivitamin (RENA-VIT) TABS tablet Take 1 tablet by mouth at bedtime.  0  . nitroGLYCERIN (NITROSTAT) 0.4 MG SL tablet Place 1 tablet (0.4 mg total) under the tongue every 5 (five) minutes as needed for chest pain. 25 tablet 2  .  oxyCODONE-acetaminophen (PERCOCET) 5-325 MG tablet Take 1 tablet by mouth every 6 (six) hours as needed for severe pain. 12 tablet 0  . pantoprazole (PROTONIX) 40 MG tablet TAKE 1 TABLET BY MOUTH DAILY. 30 tablet 2  . pregabalin (LYRICA) 75 MG capsule Take 1 capsule (75 mg total) by mouth 2 (two) times daily. 30 capsule 1  . senna-docusate (SENOKOT-S) 8.6-50 MG tablet Take 1 tablet by mouth 2 (two) times daily. (Patient taking differently: Take 1 tablet by mouth daily as needed for moderate constipation. )    . trimethoprim-polymyxin b (POLYTRIM) ophthalmic solution INSTILL ONE DROP INTO BOTH EYES 4 TIMES A DAY FOR 2 DAYS AFTER EACH MONTHLY EYE INJECTION  12  . calcium acetate (PHOSLO) 667 MG capsule TAKE 1 CAPSULE BY MOUTH THREE TIMES DAILY WITH MEALS AND 1 CAPSULE TWO TIMES DAILY WITH SNACKS  10  . isosorbide mononitrate (IMDUR) 30 MG 24 hr tablet Take 0.5 tablets (15 mg total) by mouth daily. 45 tablet 3   No current facility-administered medications for this visit.     Allergies  Allergen Reactions  . Penicillins Anaphylaxis, Itching, Swelling, Rash and Other (See Comments)    Swelling of throat & whole mouth  Has patient had a PCN reaction causing immediate rash, facial/tongue/throat swelling, SOB or lightheadedness with hypotension: Yes Has patient had a PCN reaction causing severe rash involving mucus membranes or skin necrosis: No Has patient had a PCN reaction that required hospitalization: No Has patient had a PCN reaction occurring within the last 10 years: No If all of the above answers are "NO", then may proceed with Cephalosporin use.  [name] Lisinopril Cough    Past Medical History:  Diagnosis Date  . Anemia   . Asthma   . CAD (coronary artery disease)    DES to mid LAD July 2018, residual moderate RCA disease  . Cataract   . CKD (chronic kidney disease) stage 4, GFR 15-29 ml/min (HCC)    Dialysis T/Th/Sa  . DVT (deep venous thrombosis) ([place])    1996 during pregnancy, 2015  left leg  . Essential hypertension 12/11/2015  . Gastroparesis   . GERD (gastroesophageal reflux disease)   . Hidradenitis   . Migraine   . Neuropathy   . Peripheral vascular disease ([place])    blood clot in leg  . Type 2 diabetes mellitus ([place])   . Type II diabetes mellitus ([place])     Social History   Socioeconomic History  . Marital status: Single    Spouse name: Not on file  . Number of children: 3  . Years of education: 21  . Highest education level: Not on file  Occupational History  . Occupation: Curator  Social Needs  . Financial resource strain: Not on file  . Food insecurity:    Worry: Not on file  Inability: Not on file  . Transportation needs:    Medical: Not on file    Non-medical: Not on file  Tobacco Use  . Smoking status: Former Smoker    Years: 20.00    Types: Cigarettes    Last attempt to quit: 11/2016    Years since quitting: 0.9  . Smokeless tobacco: Never Used  Substance and Sexual Activity  . Alcohol use: No    Alcohol/week: 0.0 standard drinks  . Drug use: No  . Sexual activity: Not on file  Lifestyle  . Physical activity:    Days per week: Not on file    Minutes per session: Not on file  . Stress: Not on file  Relationships  . Social connections:    Talks on phone: Not on file    Gets together: Not on file    Attends religious service: Not on file    Active member of club or organization: Not on file    Attends meetings of clubs or organizations: Not on file    Relationship status: Not on file  . Intimate partner violence:    Fear of current or ex partner: Not on file    Emotionally abused: Not on file    Physically abused: Not on file    Forced sexual activity: Not on file  Other Topics Concern  . Not on file  Social History Narrative   Lives with daughter in a 2 story home.  Has 3 children.  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  Education: college.      Family History  Problem Relation Age  of Onset  . Hypertension Father   . Hypertension Sister   . Hypertension Brother   . Breast cancer Cousin        x2     Review of Systems: General: negative for chills, fever, night sweats or weight changes.  Cardiovascular: negative for chest pain, dyspnea on exertion, edema, orthopnea, palpitations, paroxysmal nocturnal dyspnea or shortness of breath Dermatological: negative for rash Respiratory: negative for cough or wheezing Urologic: negative for hematuria Abdominal: negative for nausea, vomiting, diarrhea, bright red blood per rectum, melena, or hematemesis Neurologic: negative for visual changes, syncope, or dizziness All other systems reviewed and are otherwise negative except as noted above.    Blood pressure 138/80, pulse 91, height 5\' 5"  (1.651 m), weight 168 lb 6.4 oz (76.4 kg).  General appearance: alert, cooperative and no distress Lungs: clear to auscultation bilaterally and HD catheter Rt chest Heart: regular rate and rhythm Skin: Skin color, texture, turgor normal. No rashes or lesions Neurologic: Grossly normal  EKG NSR, TWI lead 1 and AVL, not seen on her EKG 06/18/17 but it was noted on her EKG March 2019.  ASSESSMENT AND PLAN:   Chest pain- Chest pain on dialysis worrisome for angina. transient EKG changes- seen on prior EKGs as well.  CAD-s/p PCI Cath-LAD PCI with DES July 2018 after abnormal Myoview. Residual moderate RCA disease  ESRD ESRD on HD T Th Sat. She is on transplant list at [facility]  HTN [place] control, h/o occasional low B/P on HD  DM IDDM with renal failure  PLAN  I reviewed the patients cines with dr Gwenlyn Found today in the office. We'll add low dose Imdur daily. We have also asked her to increase her Toprol to 50 mg daily on non HD days. Dr Gwenlyn Found will see back in a few weeks.   Kerin Ransom PA-C 10/19/2017 4:04 PM

## 2017-10-19 NOTE — Patient Instructions (Signed)
Medication Instructions:  INCREASE- Metoprolol 50 mg on off Dialysis day  If you need a refill on your cardiac medications before your next appointment, please call your pharmacy.  Labwork: None Ordered  If you have labs (blood work) drawn today and your tests are completely normal, you will receive your results only by: [name] MyChart Message (if you have MyChart) OR . A paper copy in the mail If you have any lab test that is abnormal or we need to change your treatment, we will call you to review the results.  Testing/Procedures: None Ordered  Follow-Up: At [facility], you and your health needs are our priority.  As part of our continuing mission to provide you with exceptional heart care, we have created designated Provider Care Teams.  These Care Teams include your primary Cardiologist (physician) and Advanced Practice Providers (APPs -  Physician Assistants and Nurse Practitioners) who all work together to provide you with the care you need, when you need it. . You will need a follow up appointment in 3-4 weeks with Susan Horton.       Thank you for choosing [facility] at [facility]!!

## 2017-10-19 NOTE — Assessment & Plan Note (Signed)
Chest pain on dialysis worrisome for angina. transient EKG changes- seen on prior EKGs as well.

## 2017-10-19 NOTE — Assessment & Plan Note (Addendum)
ESRD on HD T Th Sat. She is on transplant list at [facility]

## 2017-10-19 NOTE — Assessment & Plan Note (Signed)
Cath-LAD PCI with DES July 2018 after abnormal Myoview. Residual moderate RCA disease

## 2017-10-24 DIAGNOSIS — Z7902 Long term (current) use of antithrombotics/antiplatelets: Secondary | ICD-10-CM | POA: Diagnosis not present

## 2017-10-24 DIAGNOSIS — E119 Type 2 diabetes mellitus without complications: Secondary | ICD-10-CM | POA: Diagnosis not present

## 2017-10-24 DIAGNOSIS — Z794 Long term (current) use of insulin: Secondary | ICD-10-CM | POA: Diagnosis not present

## 2017-10-24 DIAGNOSIS — N185 Chronic kidney disease, stage 5: Secondary | ICD-10-CM | POA: Diagnosis not present

## 2017-10-24 DIAGNOSIS — Z992 Dependence on renal dialysis: Secondary | ICD-10-CM | POA: Diagnosis not present

## 2017-10-24 DIAGNOSIS — Z955 Presence of coronary angioplasty implant and graft: Secondary | ICD-10-CM | POA: Diagnosis not present

## 2017-10-29 DIAGNOSIS — R918 Other nonspecific abnormal finding of lung field: Secondary | ICD-10-CM | POA: Diagnosis not present

## 2017-10-29 DIAGNOSIS — N186 End stage renal disease: Secondary | ICD-10-CM | POA: Diagnosis not present

## 2017-10-29 DIAGNOSIS — Z992 Dependence on renal dialysis: Secondary | ICD-10-CM | POA: Diagnosis not present

## 2017-11-02 ENCOUNTER — Encounter: Payer: Medicaid Other | Attending: Physical Medicine & Rehabilitation | Admitting: Physical Medicine & Rehabilitation

## 2017-11-02 DIAGNOSIS — R531 Weakness: Secondary | ICD-10-CM | POA: Insufficient documentation

## 2017-11-02 DIAGNOSIS — N186 End stage renal disease: Secondary | ICD-10-CM | POA: Insufficient documentation

## 2017-11-02 DIAGNOSIS — I120 Hypertensive chronic kidney disease with stage 5 chronic kidney disease or end stage renal disease: Secondary | ICD-10-CM | POA: Insufficient documentation

## 2017-11-02 DIAGNOSIS — Z992 Dependence on renal dialysis: Secondary | ICD-10-CM | POA: Insufficient documentation

## 2017-11-02 DIAGNOSIS — K219 Gastro-esophageal reflux disease without esophagitis: Secondary | ICD-10-CM | POA: Insufficient documentation

## 2017-11-02 DIAGNOSIS — E1122 Type 2 diabetes mellitus with diabetic chronic kidney disease: Secondary | ICD-10-CM | POA: Insufficient documentation

## 2017-11-02 DIAGNOSIS — G479 Sleep disorder, unspecified: Secondary | ICD-10-CM | POA: Insufficient documentation

## 2017-11-02 DIAGNOSIS — E1142 Type 2 diabetes mellitus with diabetic polyneuropathy: Secondary | ICD-10-CM | POA: Insufficient documentation

## 2017-11-02 DIAGNOSIS — E1143 Type 2 diabetes mellitus with diabetic autonomic (poly)neuropathy: Secondary | ICD-10-CM | POA: Insufficient documentation

## 2017-11-02 DIAGNOSIS — I2510 Atherosclerotic heart disease of native coronary artery without angina pectoris: Secondary | ICD-10-CM | POA: Insufficient documentation

## 2017-11-02 DIAGNOSIS — R269 Unspecified abnormalities of gait and mobility: Secondary | ICD-10-CM | POA: Insufficient documentation

## 2017-11-02 DIAGNOSIS — J45909 Unspecified asthma, uncomplicated: Secondary | ICD-10-CM | POA: Insufficient documentation

## 2017-11-02 DIAGNOSIS — Z86718 Personal history of other venous thrombosis and embolism: Secondary | ICD-10-CM | POA: Insufficient documentation

## 2017-11-02 DIAGNOSIS — Z87891 Personal history of nicotine dependence: Secondary | ICD-10-CM | POA: Insufficient documentation

## 2017-11-04 MED FILL — GENTAMICIN 0.1% CREAM: 0.1 | 30 days supply | Qty: 30 | Fill #0

## 2017-11-08 DIAGNOSIS — N19 Unspecified kidney failure: Secondary | ICD-10-CM | POA: Diagnosis not present

## 2017-11-08 DIAGNOSIS — N186 End stage renal disease: Secondary | ICD-10-CM | POA: Diagnosis not present

## 2017-11-08 DIAGNOSIS — Z4902 Encounter for fitting and adjustment of peritoneal dialysis catheter: Secondary | ICD-10-CM | POA: Diagnosis not present

## 2017-11-08 DIAGNOSIS — E878 Other disorders of electrolyte and fluid balance, not elsewhere classified: Secondary | ICD-10-CM | POA: Diagnosis not present

## 2017-11-08 DIAGNOSIS — E1122 Type 2 diabetes mellitus with diabetic chronic kidney disease: Secondary | ICD-10-CM | POA: Diagnosis not present

## 2017-11-08 DIAGNOSIS — N189 Chronic kidney disease, unspecified: Secondary | ICD-10-CM | POA: Diagnosis not present

## 2017-11-08 DIAGNOSIS — K660 Peritoneal adhesions (postprocedural) (postinfection): Secondary | ICD-10-CM | POA: Diagnosis not present

## 2017-11-09 ENCOUNTER — Ambulatory Visit (INDEPENDENT_AMBULATORY_CARE_PROVIDER_SITE_OTHER): Payer: Self-pay | Admitting: Cardiovascular Disease

## 2017-11-09 ENCOUNTER — Encounter: Payer: Self-pay | Admitting: Cardiovascular Disease

## 2017-11-09 VITALS — BP 142/78 | HR 90 | Ht 65.0 in | Wt 175.6 lb

## 2017-11-09 DIAGNOSIS — E785 Hyperlipidemia, unspecified: Secondary | ICD-10-CM

## 2017-11-09 DIAGNOSIS — Z9861 Coronary angioplasty status: Secondary | ICD-10-CM

## 2017-11-09 DIAGNOSIS — R0989 Other specified symptoms and signs involving the circulatory and respiratory systems: Secondary | ICD-10-CM

## 2017-11-09 DIAGNOSIS — I2510 Atherosclerotic heart disease of native coronary artery without angina pectoris: Secondary | ICD-10-CM

## 2017-11-09 DIAGNOSIS — I10 Essential (primary) hypertension: Secondary | ICD-10-CM

## 2017-11-09 NOTE — Progress Notes (Signed)
11/09/2017 Susan Horton   [birth year]  [id number]  Primary Physician Charlott Rakes, MD Primary Cardiologist: [name] MD [name], [place]  HPI:  Susan Horton is a 47 y.o.  moderately overweight single African-American female mother of 33, grandmother and 4 grandchildren who I last saw in the office 06/30/2017.  She has a history of treated hypertension, hyperlipidemia and diabetes which she's had for the last 22 years. She has CKD 3 with creatinine running in the mid 3 range followed by Dr. Jimmy Footman. She's noticed chest pain over the last 3-4 months occurring several times a week lasting minutes at a time worse with exertion. She had a Myoview stress test was read as intermediate risk with inferior ischemia. Based on this, I decided to proceed with outpatient radial diagnostic coronary angiography using limited contrast. This was performed 08/05/16 the right femoral approach revealed an 80% mid LAD which was stented using a synergy drug-eluting stent reducing 80% stenosis to 0% residual. She did have moderate mid RCA stenosis which was not addressed. She had left main spasm probably related to the guide catheter doing a procedure that responded to intra-coronary nitroglycerin. Her chest pain has resolved and she has stopped smoking since. She did have carotid Dopplers performed because of bruits that showed mild to moderate left ICA stenosis which will be followed on an annual basis. Since I saw her 4 months ago she has done well.  She gets occasional chest pain during hemodialysis.  She recently had a peritoneal dialysis placed yesterday and is planning on transitioning to home peritoneal dialysis.  We have also talked about going on the kidney transplant list although her hemoglobin A1c is still elevated in the 11 range.  She has stopped smoking.  She was recently begun on Repatha for resistant hyperlipidemia.   Current Meds  Medication Sig  . albuterol (PROVENTIL HFA;VENTOLIN  HFA) 108 (90 BASE) MCG/ACT inhaler Inhale 1-2 puffs into the lungs every 6 (six) hours as needed for wheezing or shortness of breath.   [name] aspirin 81 MG chewable tablet Chew 1 tablet (81 mg total) by mouth daily.  . calcium acetate (PHOSLO) 667 MG capsule TAKE 1 CAPSULE BY MOUTH THREE TIMES DAILY WITH MEALS AND 1 CAPSULE TWO TIMES DAILY WITH SNACKS  . capsicum (ZOSTRIX) 0.075 % topical cream Apply 1 application topically 2 (two) times daily.  . clopidogrel (PLAVIX) 75 MG tablet Take 1 tablet (75 mg total) by mouth daily with breakfast.  . Evolocumab (REPATHA SURECLICK) 470 MG/ML SOAJ Inject 140 mg into the skin every 14 (fourteen) days.  . Ferrous Sulfate (IRON) 325 (65 Fe) MG TABS Take 325 mg by mouth daily.  . fluconazole (DIFLUCAN) 150 MG tablet Take 1 tablet (150 mg total) by mouth daily.  . Fluticasone-Salmeterol (ADVAIR) 250-50 MCG/DOSE AEPB Inhale 1 puff into the lungs 2 (two) times daily. (Patient taking differently: Inhale 1 puff into the lungs daily as needed (shortness of breath). )  . Insulin Glargine (TOUJEO SOLOSTAR) 300 UNIT/ML SOPN Inject 25 Units into the skin 2 (two) times daily.  . insulin regular (HUMULIN R) 100 units/mL injection Inject 0.2-0.25 mLs (20-25 Units total) into the skin See admin instructions.  . isosorbide mononitrate (IMDUR) 30 MG 24 hr tablet Take 0.5 tablets (15 mg total) by mouth daily.  [name] lidocaine (XYLOCAINE) 5 % ointment Apply 1 application topically as needed.  . lidocaine-prilocaine (EMLA) cream APPLY SMALL AMOUNT TO ACCESS SITE (AVF) 30 MINUTES BEFORE DIALYSIS. [place]  WITH OCCLUSIVE DRESSING (SARAN WRAP) THREE DAYS A WEEK (3 TIMES A  . lovastatin (MEVACOR) 20 MG tablet Take 20 mg by mouth at bedtime.   . methocarbamol (ROBAXIN) 500 MG tablet Take 1 tablet (500 mg total) by mouth 2 (two) times daily as needed for muscle spasms.  . metoCLOPramide (REGLAN) 5 MG tablet TAKE 1 TABLET BY MOUTH 4 TIMES DAILY BEFORE MEALS AND AT BEDTIME. (Patient taking differently:  Take 5 mg by mouth 4 (four) times daily -  before meals and at bedtime. )  . metoprolol succinate (TOPROL-XL) 50 MG 24 hr tablet Take 1 tablet (50 mg total) by mouth See admin instructions. Take 25 mg on Tuesday ,Thursday and Saturday, and 50 mg on M,W, F, Sunday  . montelukast (SINGULAIR) 10 MG tablet Take 10 mg by mouth daily as needed (allergies).   . multivitamin (RENA-VIT) TABS tablet Take 1 tablet by mouth at bedtime.  . nitroGLYCERIN (NITROSTAT) 0.4 MG SL tablet Place 1 tablet (0.4 mg total) under the tongue every 5 (five) minutes as needed for chest pain.  [name] oxyCODONE-acetaminophen (PERCOCET) 5-325 MG tablet Take 1 tablet by mouth every 6 (six) hours as needed for severe pain.  . pantoprazole (PROTONIX) 40 MG tablet TAKE 1 TABLET BY MOUTH DAILY.  [name] pregabalin (LYRICA) 75 MG capsule Take 1 capsule (75 mg total) by mouth 2 (two) times daily.  [name] senna-docusate (SENOKOT-S) 8.6-50 MG tablet Take 1 tablet by mouth 2 (two) times daily. (Patient taking differently: Take 1 tablet by mouth daily as needed for moderate constipation. )  . trimethoprim-polymyxin b (POLYTRIM) ophthalmic solution INSTILL ONE DROP INTO BOTH EYES 4 TIMES A DAY FOR 2 DAYS AFTER EACH MONTHLY EYE INJECTION     Allergies  Allergen Reactions  . Penicillins Anaphylaxis, Itching, Swelling, Rash and Other (See Comments)    Swelling of throat & whole mouth  Has patient had a PCN reaction causing immediate rash, facial/tongue/throat swelling, SOB or lightheadedness with hypotension: Yes Has patient had a PCN reaction causing severe rash involving mucus membranes or skin necrosis: No Has patient had a PCN reaction that required hospitalization: No Has patient had a PCN reaction occurring within the last 10 years: No If all of the above answers are "NO", then may proceed with Cephalosporin use.  [name] Lisinopril Cough    Social History   Socioeconomic History  . Marital status: Single    Spouse name: Not on file  . Number of  children: 3  . Years of education: 41  . Highest education level: Not on file  Occupational History  . Occupation: Curator  Social Needs  . Financial resource strain: Not on file  . Food insecurity:    Worry: Not on file    Inability: Not on file  . Transportation needs:    Medical: Not on file    Non-medical: Not on file  Tobacco Use  . Smoking status: Former Smoker    Years: 20.00    Types: Cigarettes    Last attempt to quit: 11/2016    Years since quitting: 0.9  . Smokeless tobacco: Never Used  Substance and Sexual Activity  . Alcohol use: No    Alcohol/week: 0.0 standard drinks  . Drug use: No  . Sexual activity: Not on file  Lifestyle  . Physical activity:    Days per week: Not on file    Minutes per session: Not on file  . Stress: Not on file  Relationships  . Social connections:  Talks on phone: Not on file    Gets together: Not on file    Attends religious service: Not on file    Active member of club or organization: Not on file    Attends meetings of clubs or organizations: Not on file    Relationship status: Not on file  . Intimate partner violence:    Fear of current or ex partner: Not on file    Emotionally abused: Not on file    Physically abused: Not on file    Forced sexual activity: Not on file  Other Topics Concern  . Not on file  Social History Narrative   Lives with daughter in a 2 story home.  Has 3 children.  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  Education: college.      Review of Systems: General: negative for chills, fever, night sweats or weight changes.  Cardiovascular: negative for chest pain, dyspnea on exertion, edema, orthopnea, palpitations, paroxysmal nocturnal dyspnea or shortness of breath Dermatological: negative for rash Respiratory: negative for cough or wheezing Urologic: negative for hematuria Abdominal: negative for nausea, vomiting, diarrhea, bright red blood per rectum, melena, or  hematemesis Neurologic: negative for visual changes, syncope, or dizziness All other systems reviewed and are otherwise negative except as noted above.    Blood pressure (!) 142/78, pulse 90, height 5\' 5"  (1.651 m), weight 175 lb 9.6 oz (79.7 kg).  General appearance: alert and no distress Neck: no adenopathy, no JVD, supple, symmetrical, trachea midline, thyroid not enlarged, symmetric, no tenderness/mass/nodules and Bilateral carotid bruits Lungs: clear to auscultation bilaterally Heart: regular rate and rhythm, S1, S2 normal, no murmur, click, rub or gallop Extremities: extremities normal, atraumatic, no cyanosis or edema Pulses: 2+ and symmetric Skin: Skin color, texture, turgor normal. No rashes or lesions Neurologic: Alert and oriented X 3, normal strength and tone. Normal symmetric reflexes. Normal coordination and gait  EKG not performed today  ASSESSMENT AND PLAN:   CAD S/P mLAD PCI with DES History of CAD status post LAD stenting by myself 08/05/2016 with a synergy drug-eluting stent.  This was done to the right common femoral approach.  She did have a 60% mid RCA stenosis which was treated medically and normal LV function.  Her angina improved after that.  She gets occasional chest pain during hemodialysis.  She remains on dual antibiotic therapy.  Essential hypertension, benign She of essential hypertension her blood pressure measured today 142/78.  She is on metoprolol.  Continue current meds at current dosing.  Carotid bruit present Bilateral carotid bruits with moderate left ICA stenosis by recent duplex ultrasound form 07/13/2017.  This will be repeated on an annual basis.  Dyslipidemia History of dyslipidemia on statin therapy and recently begun on Repatha.  Her most recent lipid profile performed 07/21/2017 revealed total cholesterol of 248, LDL 165 and HDL of 33.  This is scheduled to be rechecked in several months.      [name] MD FACP,FACC,FAHA,  [facility] 11/09/2017 11:42 AM

## 2017-11-09 NOTE — Assessment & Plan Note (Signed)
History of dyslipidemia on statin therapy and recently begun on Repatha.  Her most recent lipid profile performed 07/21/2017 revealed total cholesterol of 248, LDL 165 and HDL of 33.  This is scheduled to be rechecked in several months.

## 2017-11-09 NOTE — Patient Instructions (Signed)
Medication Instructions:  Your physician recommends that you continue on your current medications as directed. Please refer to the Current Medication list given to you today.  If you need a refill on your cardiac medications before your next appointment, please call your pharmacy.   Lab work: none If you have labs (blood work) drawn today and your tests are completely normal, you will receive your results only by: [name] MyChart Message (if you have MyChart) OR . A paper copy in the mail If you have any lab test that is abnormal or we need to change your treatment, we will call you to review the results.  Testing/Procedures: none  Follow-Up: At [facility], you and your health needs are our priority.  As part of our continuing mission to provide you with exceptional heart care, we have created designated Provider Care Teams.  These Care Teams include your primary Cardiologist (physician) and Advanced Practice Providers (APPs -  Physician Assistants and Nurse Practitioners) who all work together to provide you with the care you need, when you need it. You will need a follow up appointment in 6 months with Kerin Ransom, PA and Dr. Gwenlyn Found in 12 months.  Please call our office 2 months in advance to schedule this appointment.  You may see  Dr. Gwenlyn Found or one of the following Advanced Practice Providers on your designated Care Team:   Kerin Ransom, PA-C Roby Lofts, [place] . Sande Rives, PA-C  Any Other Special Instructions Will Be Listed Below (If Applicable).

## 2017-11-09 NOTE — Assessment & Plan Note (Signed)
She of essential hypertension her blood pressure measured today 142/78.  She is on metoprolol.  Continue current meds at current dosing.

## 2017-11-09 NOTE — Assessment & Plan Note (Signed)
Bilateral carotid bruits with moderate left ICA stenosis by recent duplex ultrasound form 07/13/2017.  This will be repeated on an annual basis.

## 2017-11-09 NOTE — Assessment & Plan Note (Addendum)
History of CAD status post LAD stenting by myself 08/05/2016 with a synergy drug-eluting stent.  This was done to the right common femoral approach.  She did have a 60% mid RCA stenosis which was treated medically and normal LV function.  Her angina improved after that.  She gets occasional chest pain during hemodialysis.  She remains on dual antibiotic therapy.

## 2017-11-28 DIAGNOSIS — R82998 Other abnormal findings in urine: Secondary | ICD-10-CM | POA: Insufficient documentation

## 2017-11-28 DIAGNOSIS — Z79899 Other long term (current) drug therapy: Secondary | ICD-10-CM | POA: Insufficient documentation

## 2017-11-28 DIAGNOSIS — Z992 Dependence on renal dialysis: Secondary | ICD-10-CM | POA: Insufficient documentation

## 2017-12-11 DIAGNOSIS — N186 End stage renal disease: Secondary | ICD-10-CM | POA: Diagnosis not present

## 2017-12-11 DIAGNOSIS — N2581 Secondary hyperparathyroidism of renal origin: Secondary | ICD-10-CM | POA: Diagnosis not present

## 2017-12-11 DIAGNOSIS — E1122 Type 2 diabetes mellitus with diabetic chronic kidney disease: Secondary | ICD-10-CM | POA: Diagnosis not present

## 2017-12-11 DIAGNOSIS — D631 Anemia in chronic kidney disease: Secondary | ICD-10-CM | POA: Diagnosis not present

## 2017-12-11 DIAGNOSIS — D509 Iron deficiency anemia, unspecified: Secondary | ICD-10-CM | POA: Diagnosis not present

## 2017-12-11 DIAGNOSIS — Z992 Dependence on renal dialysis: Secondary | ICD-10-CM | POA: Diagnosis not present

## 2017-12-12 ENCOUNTER — Ambulatory Visit: Payer: Medicaid Other | Admitting: Neurology

## 2017-12-12 DIAGNOSIS — D631 Anemia in chronic kidney disease: Secondary | ICD-10-CM | POA: Diagnosis not present

## 2017-12-12 DIAGNOSIS — N2581 Secondary hyperparathyroidism of renal origin: Secondary | ICD-10-CM | POA: Diagnosis not present

## 2017-12-12 DIAGNOSIS — D509 Iron deficiency anemia, unspecified: Secondary | ICD-10-CM | POA: Diagnosis not present

## 2017-12-12 DIAGNOSIS — N186 End stage renal disease: Secondary | ICD-10-CM | POA: Diagnosis not present

## 2017-12-12 MED FILL — FLUCONAZOLE 100 MG TABLET: 100 | 10 days supply | Qty: 10 | Fill #0

## 2017-12-12 MED FILL — CLOPIDOGREL 75 MG TABLET: 75 | 30 days supply | Qty: 30 | Fill #1

## 2017-12-12 MED FILL — CALCITRIOL 0.5 MCG CAPS: 0.5 | 30 days supply | Qty: 30 | Fill #0

## 2017-12-12 MED FILL — PANTOPRAZOLE SOD DR 40 MG T: 40 | 30 days supply | Qty: 30 | Fill #1

## 2017-12-12 MED FILL — HumuLIN R 100 UNIT/ML SOLN: 100 | 26 days supply | Qty: 20 | Fill #1

## 2017-12-12 MED FILL — ISOSORBIDE MN ER 30 MG TAB: 30 | 30 days supply | Qty: 15 | Fill #1

## 2017-12-12 MED FILL — TOUJEO SOLOSTAR 300 UNITS/M: 300 | 27 days supply | Qty: 5 | Fill #1

## 2017-12-12 MED FILL — METOPROLOL SUCCINATE ER 50: 50 | 28 days supply | Qty: 22 | Fill #1

## 2017-12-13 DIAGNOSIS — D631 Anemia in chronic kidney disease: Secondary | ICD-10-CM | POA: Diagnosis not present

## 2017-12-13 DIAGNOSIS — N186 End stage renal disease: Secondary | ICD-10-CM | POA: Diagnosis not present

## 2017-12-13 DIAGNOSIS — N2581 Secondary hyperparathyroidism of renal origin: Secondary | ICD-10-CM | POA: Diagnosis not present

## 2017-12-13 DIAGNOSIS — D509 Iron deficiency anemia, unspecified: Secondary | ICD-10-CM | POA: Diagnosis not present

## 2017-12-14 ENCOUNTER — Telehealth: Payer: Self-pay

## 2017-12-14 DIAGNOSIS — N2581 Secondary hyperparathyroidism of renal origin: Secondary | ICD-10-CM | POA: Diagnosis not present

## 2017-12-14 DIAGNOSIS — D509 Iron deficiency anemia, unspecified: Secondary | ICD-10-CM | POA: Diagnosis not present

## 2017-12-14 DIAGNOSIS — N186 End stage renal disease: Secondary | ICD-10-CM | POA: Diagnosis not present

## 2017-12-14 DIAGNOSIS — D631 Anemia in chronic kidney disease: Secondary | ICD-10-CM | POA: Diagnosis not present

## 2017-12-14 NOTE — Telephone Encounter (Signed)
Called pt to tell them that their medicaid card was inactive pt stated that they have switched insurance into medicare and doesn't have a card quite yet and will bring it by after they choose a plan a receive a new card

## 2017-12-15 DIAGNOSIS — D631 Anemia in chronic kidney disease: Secondary | ICD-10-CM | POA: Diagnosis not present

## 2017-12-15 DIAGNOSIS — N186 End stage renal disease: Secondary | ICD-10-CM | POA: Diagnosis not present

## 2017-12-15 DIAGNOSIS — N2581 Secondary hyperparathyroidism of renal origin: Secondary | ICD-10-CM | POA: Diagnosis not present

## 2017-12-15 DIAGNOSIS — D509 Iron deficiency anemia, unspecified: Secondary | ICD-10-CM | POA: Diagnosis not present

## 2017-12-16 DIAGNOSIS — N186 End stage renal disease: Secondary | ICD-10-CM | POA: Diagnosis not present

## 2017-12-16 DIAGNOSIS — D631 Anemia in chronic kidney disease: Secondary | ICD-10-CM | POA: Diagnosis not present

## 2017-12-16 DIAGNOSIS — N2581 Secondary hyperparathyroidism of renal origin: Secondary | ICD-10-CM | POA: Diagnosis not present

## 2017-12-16 DIAGNOSIS — D509 Iron deficiency anemia, unspecified: Secondary | ICD-10-CM | POA: Diagnosis not present

## 2017-12-17 DIAGNOSIS — D631 Anemia in chronic kidney disease: Secondary | ICD-10-CM | POA: Diagnosis not present

## 2017-12-17 DIAGNOSIS — N2581 Secondary hyperparathyroidism of renal origin: Secondary | ICD-10-CM | POA: Diagnosis not present

## 2017-12-17 DIAGNOSIS — N186 End stage renal disease: Secondary | ICD-10-CM | POA: Diagnosis not present

## 2017-12-17 DIAGNOSIS — D509 Iron deficiency anemia, unspecified: Secondary | ICD-10-CM | POA: Diagnosis not present

## 2017-12-18 DIAGNOSIS — N2581 Secondary hyperparathyroidism of renal origin: Secondary | ICD-10-CM | POA: Diagnosis not present

## 2017-12-18 DIAGNOSIS — D509 Iron deficiency anemia, unspecified: Secondary | ICD-10-CM | POA: Diagnosis not present

## 2017-12-18 DIAGNOSIS — N186 End stage renal disease: Secondary | ICD-10-CM | POA: Diagnosis not present

## 2017-12-18 DIAGNOSIS — D631 Anemia in chronic kidney disease: Secondary | ICD-10-CM | POA: Diagnosis not present

## 2017-12-19 DIAGNOSIS — R82998 Other abnormal findings in urine: Secondary | ICD-10-CM | POA: Diagnosis not present

## 2017-12-19 DIAGNOSIS — D631 Anemia in chronic kidney disease: Secondary | ICD-10-CM | POA: Diagnosis not present

## 2017-12-19 DIAGNOSIS — N2581 Secondary hyperparathyroidism of renal origin: Secondary | ICD-10-CM | POA: Diagnosis not present

## 2017-12-19 DIAGNOSIS — N186 End stage renal disease: Secondary | ICD-10-CM | POA: Diagnosis not present

## 2017-12-19 DIAGNOSIS — D509 Iron deficiency anemia, unspecified: Secondary | ICD-10-CM | POA: Diagnosis not present

## 2017-12-20 DIAGNOSIS — N186 End stage renal disease: Secondary | ICD-10-CM | POA: Diagnosis not present

## 2017-12-20 DIAGNOSIS — D631 Anemia in chronic kidney disease: Secondary | ICD-10-CM | POA: Diagnosis not present

## 2017-12-20 DIAGNOSIS — N2581 Secondary hyperparathyroidism of renal origin: Secondary | ICD-10-CM | POA: Diagnosis not present

## 2017-12-20 DIAGNOSIS — D509 Iron deficiency anemia, unspecified: Secondary | ICD-10-CM | POA: Diagnosis not present

## 2017-12-21 DIAGNOSIS — D509 Iron deficiency anemia, unspecified: Secondary | ICD-10-CM | POA: Diagnosis not present

## 2017-12-21 DIAGNOSIS — N2581 Secondary hyperparathyroidism of renal origin: Secondary | ICD-10-CM | POA: Diagnosis not present

## 2017-12-21 DIAGNOSIS — N186 End stage renal disease: Secondary | ICD-10-CM | POA: Diagnosis not present

## 2017-12-21 DIAGNOSIS — D631 Anemia in chronic kidney disease: Secondary | ICD-10-CM | POA: Diagnosis not present

## 2017-12-22 DIAGNOSIS — N186 End stage renal disease: Secondary | ICD-10-CM | POA: Diagnosis not present

## 2017-12-22 DIAGNOSIS — N2581 Secondary hyperparathyroidism of renal origin: Secondary | ICD-10-CM | POA: Diagnosis not present

## 2017-12-22 DIAGNOSIS — D509 Iron deficiency anemia, unspecified: Secondary | ICD-10-CM | POA: Diagnosis not present

## 2017-12-22 DIAGNOSIS — D631 Anemia in chronic kidney disease: Secondary | ICD-10-CM | POA: Diagnosis not present

## 2017-12-23 DIAGNOSIS — N2581 Secondary hyperparathyroidism of renal origin: Secondary | ICD-10-CM | POA: Diagnosis not present

## 2017-12-23 DIAGNOSIS — D631 Anemia in chronic kidney disease: Secondary | ICD-10-CM | POA: Diagnosis not present

## 2017-12-23 DIAGNOSIS — D509 Iron deficiency anemia, unspecified: Secondary | ICD-10-CM | POA: Diagnosis not present

## 2017-12-23 DIAGNOSIS — N186 End stage renal disease: Secondary | ICD-10-CM | POA: Diagnosis not present

## 2017-12-24 DIAGNOSIS — N186 End stage renal disease: Secondary | ICD-10-CM | POA: Diagnosis not present

## 2017-12-24 DIAGNOSIS — D631 Anemia in chronic kidney disease: Secondary | ICD-10-CM | POA: Diagnosis not present

## 2017-12-24 DIAGNOSIS — N2581 Secondary hyperparathyroidism of renal origin: Secondary | ICD-10-CM | POA: Diagnosis not present

## 2017-12-24 DIAGNOSIS — D509 Iron deficiency anemia, unspecified: Secondary | ICD-10-CM | POA: Diagnosis not present

## 2017-12-25 DIAGNOSIS — D631 Anemia in chronic kidney disease: Secondary | ICD-10-CM | POA: Diagnosis not present

## 2017-12-25 DIAGNOSIS — N2581 Secondary hyperparathyroidism of renal origin: Secondary | ICD-10-CM | POA: Diagnosis not present

## 2017-12-25 DIAGNOSIS — N186 End stage renal disease: Secondary | ICD-10-CM | POA: Diagnosis not present

## 2017-12-25 DIAGNOSIS — D509 Iron deficiency anemia, unspecified: Secondary | ICD-10-CM | POA: Diagnosis not present

## 2017-12-26 DIAGNOSIS — N2581 Secondary hyperparathyroidism of renal origin: Secondary | ICD-10-CM | POA: Diagnosis not present

## 2017-12-26 DIAGNOSIS — N186 End stage renal disease: Secondary | ICD-10-CM | POA: Diagnosis not present

## 2017-12-26 DIAGNOSIS — D509 Iron deficiency anemia, unspecified: Secondary | ICD-10-CM | POA: Diagnosis not present

## 2017-12-26 DIAGNOSIS — D631 Anemia in chronic kidney disease: Secondary | ICD-10-CM | POA: Diagnosis not present

## 2017-12-27 DIAGNOSIS — D631 Anemia in chronic kidney disease: Secondary | ICD-10-CM | POA: Diagnosis not present

## 2017-12-27 DIAGNOSIS — N2581 Secondary hyperparathyroidism of renal origin: Secondary | ICD-10-CM | POA: Diagnosis not present

## 2017-12-27 DIAGNOSIS — D509 Iron deficiency anemia, unspecified: Secondary | ICD-10-CM | POA: Diagnosis not present

## 2017-12-27 DIAGNOSIS — N186 End stage renal disease: Secondary | ICD-10-CM | POA: Diagnosis not present

## 2017-12-28 DIAGNOSIS — N2581 Secondary hyperparathyroidism of renal origin: Secondary | ICD-10-CM | POA: Diagnosis not present

## 2017-12-28 DIAGNOSIS — D631 Anemia in chronic kidney disease: Secondary | ICD-10-CM | POA: Diagnosis not present

## 2017-12-28 DIAGNOSIS — N186 End stage renal disease: Secondary | ICD-10-CM | POA: Diagnosis not present

## 2017-12-28 DIAGNOSIS — D509 Iron deficiency anemia, unspecified: Secondary | ICD-10-CM | POA: Diagnosis not present

## 2017-12-29 DIAGNOSIS — D509 Iron deficiency anemia, unspecified: Secondary | ICD-10-CM | POA: Diagnosis not present

## 2017-12-29 DIAGNOSIS — N2581 Secondary hyperparathyroidism of renal origin: Secondary | ICD-10-CM | POA: Diagnosis not present

## 2017-12-29 DIAGNOSIS — N186 End stage renal disease: Secondary | ICD-10-CM | POA: Diagnosis not present

## 2017-12-29 DIAGNOSIS — D631 Anemia in chronic kidney disease: Secondary | ICD-10-CM | POA: Diagnosis not present

## 2017-12-30 ENCOUNTER — Ambulatory Visit: Payer: Medicaid Other | Admitting: Internal Medicine

## 2017-12-30 DIAGNOSIS — N186 End stage renal disease: Secondary | ICD-10-CM | POA: Diagnosis not present

## 2017-12-30 DIAGNOSIS — D631 Anemia in chronic kidney disease: Secondary | ICD-10-CM | POA: Diagnosis not present

## 2017-12-30 DIAGNOSIS — D509 Iron deficiency anemia, unspecified: Secondary | ICD-10-CM | POA: Diagnosis not present

## 2017-12-30 DIAGNOSIS — N2581 Secondary hyperparathyroidism of renal origin: Secondary | ICD-10-CM | POA: Diagnosis not present

## 2017-12-31 DIAGNOSIS — D509 Iron deficiency anemia, unspecified: Secondary | ICD-10-CM | POA: Diagnosis not present

## 2017-12-31 DIAGNOSIS — N186 End stage renal disease: Secondary | ICD-10-CM | POA: Diagnosis not present

## 2017-12-31 DIAGNOSIS — N2581 Secondary hyperparathyroidism of renal origin: Secondary | ICD-10-CM | POA: Diagnosis not present

## 2017-12-31 DIAGNOSIS — D631 Anemia in chronic kidney disease: Secondary | ICD-10-CM | POA: Diagnosis not present

## 2018-01-01 DIAGNOSIS — N2581 Secondary hyperparathyroidism of renal origin: Secondary | ICD-10-CM | POA: Diagnosis not present

## 2018-01-01 DIAGNOSIS — D509 Iron deficiency anemia, unspecified: Secondary | ICD-10-CM | POA: Diagnosis not present

## 2018-01-01 DIAGNOSIS — D631 Anemia in chronic kidney disease: Secondary | ICD-10-CM | POA: Diagnosis not present

## 2018-01-01 DIAGNOSIS — N186 End stage renal disease: Secondary | ICD-10-CM | POA: Diagnosis not present

## 2018-01-02 DIAGNOSIS — N186 End stage renal disease: Secondary | ICD-10-CM | POA: Diagnosis not present

## 2018-01-02 DIAGNOSIS — N2581 Secondary hyperparathyroidism of renal origin: Secondary | ICD-10-CM | POA: Diagnosis not present

## 2018-01-02 DIAGNOSIS — D509 Iron deficiency anemia, unspecified: Secondary | ICD-10-CM | POA: Diagnosis not present

## 2018-01-02 DIAGNOSIS — D631 Anemia in chronic kidney disease: Secondary | ICD-10-CM | POA: Diagnosis not present

## 2018-01-03 DIAGNOSIS — N2581 Secondary hyperparathyroidism of renal origin: Secondary | ICD-10-CM | POA: Diagnosis not present

## 2018-01-03 DIAGNOSIS — D509 Iron deficiency anemia, unspecified: Secondary | ICD-10-CM | POA: Diagnosis not present

## 2018-01-03 DIAGNOSIS — D631 Anemia in chronic kidney disease: Secondary | ICD-10-CM | POA: Diagnosis not present

## 2018-01-03 DIAGNOSIS — N186 End stage renal disease: Secondary | ICD-10-CM | POA: Diagnosis not present

## 2018-01-04 DIAGNOSIS — D631 Anemia in chronic kidney disease: Secondary | ICD-10-CM | POA: Diagnosis not present

## 2018-01-04 DIAGNOSIS — D509 Iron deficiency anemia, unspecified: Secondary | ICD-10-CM | POA: Diagnosis not present

## 2018-01-04 DIAGNOSIS — N2581 Secondary hyperparathyroidism of renal origin: Secondary | ICD-10-CM | POA: Diagnosis not present

## 2018-01-04 DIAGNOSIS — N186 End stage renal disease: Secondary | ICD-10-CM | POA: Diagnosis not present

## 2018-01-05 DIAGNOSIS — N186 End stage renal disease: Secondary | ICD-10-CM | POA: Diagnosis not present

## 2018-01-05 DIAGNOSIS — N2581 Secondary hyperparathyroidism of renal origin: Secondary | ICD-10-CM | POA: Diagnosis not present

## 2018-01-05 DIAGNOSIS — D631 Anemia in chronic kidney disease: Secondary | ICD-10-CM | POA: Diagnosis not present

## 2018-01-05 DIAGNOSIS — D509 Iron deficiency anemia, unspecified: Secondary | ICD-10-CM | POA: Diagnosis not present

## 2018-01-06 DIAGNOSIS — N186 End stage renal disease: Secondary | ICD-10-CM | POA: Diagnosis not present

## 2018-01-06 DIAGNOSIS — N2581 Secondary hyperparathyroidism of renal origin: Secondary | ICD-10-CM | POA: Diagnosis not present

## 2018-01-06 DIAGNOSIS — D631 Anemia in chronic kidney disease: Secondary | ICD-10-CM | POA: Diagnosis not present

## 2018-01-06 DIAGNOSIS — D509 Iron deficiency anemia, unspecified: Secondary | ICD-10-CM | POA: Diagnosis not present

## 2018-01-06 DIAGNOSIS — Z452 Encounter for adjustment and management of vascular access device: Secondary | ICD-10-CM | POA: Diagnosis not present

## 2018-01-07 DIAGNOSIS — D631 Anemia in chronic kidney disease: Secondary | ICD-10-CM | POA: Diagnosis not present

## 2018-01-07 DIAGNOSIS — N2581 Secondary hyperparathyroidism of renal origin: Secondary | ICD-10-CM | POA: Diagnosis not present

## 2018-01-07 DIAGNOSIS — D509 Iron deficiency anemia, unspecified: Secondary | ICD-10-CM | POA: Diagnosis not present

## 2018-01-07 DIAGNOSIS — N186 End stage renal disease: Secondary | ICD-10-CM | POA: Diagnosis not present

## 2018-01-08 DIAGNOSIS — D631 Anemia in chronic kidney disease: Secondary | ICD-10-CM | POA: Diagnosis not present

## 2018-01-08 DIAGNOSIS — D509 Iron deficiency anemia, unspecified: Secondary | ICD-10-CM | POA: Diagnosis not present

## 2018-01-08 DIAGNOSIS — N186 End stage renal disease: Secondary | ICD-10-CM | POA: Diagnosis not present

## 2018-01-08 DIAGNOSIS — N2581 Secondary hyperparathyroidism of renal origin: Secondary | ICD-10-CM | POA: Diagnosis not present

## 2018-01-09 DIAGNOSIS — D509 Iron deficiency anemia, unspecified: Secondary | ICD-10-CM | POA: Diagnosis not present

## 2018-01-09 DIAGNOSIS — N2581 Secondary hyperparathyroidism of renal origin: Secondary | ICD-10-CM | POA: Diagnosis not present

## 2018-01-09 DIAGNOSIS — D631 Anemia in chronic kidney disease: Secondary | ICD-10-CM | POA: Diagnosis not present

## 2018-01-09 DIAGNOSIS — N186 End stage renal disease: Secondary | ICD-10-CM | POA: Diagnosis not present

## 2018-01-10 DIAGNOSIS — D631 Anemia in chronic kidney disease: Secondary | ICD-10-CM | POA: Diagnosis not present

## 2018-01-10 DIAGNOSIS — N2581 Secondary hyperparathyroidism of renal origin: Secondary | ICD-10-CM | POA: Diagnosis not present

## 2018-01-10 DIAGNOSIS — D509 Iron deficiency anemia, unspecified: Secondary | ICD-10-CM | POA: Diagnosis not present

## 2018-01-10 DIAGNOSIS — N186 End stage renal disease: Secondary | ICD-10-CM | POA: Diagnosis not present

## 2018-01-11 DIAGNOSIS — Z79899 Other long term (current) drug therapy: Secondary | ICD-10-CM | POA: Diagnosis not present

## 2018-01-11 DIAGNOSIS — Z4902 Encounter for fitting and adjustment of peritoneal dialysis catheter: Secondary | ICD-10-CM | POA: Diagnosis not present

## 2018-01-11 DIAGNOSIS — E1122 Type 2 diabetes mellitus with diabetic chronic kidney disease: Secondary | ICD-10-CM | POA: Diagnosis not present

## 2018-01-11 DIAGNOSIS — Z992 Dependence on renal dialysis: Secondary | ICD-10-CM | POA: Diagnosis not present

## 2018-01-11 DIAGNOSIS — N2581 Secondary hyperparathyroidism of renal origin: Secondary | ICD-10-CM | POA: Diagnosis not present

## 2018-01-11 DIAGNOSIS — D509 Iron deficiency anemia, unspecified: Secondary | ICD-10-CM | POA: Diagnosis not present

## 2018-01-11 DIAGNOSIS — N186 End stage renal disease: Secondary | ICD-10-CM | POA: Diagnosis not present

## 2018-01-11 DIAGNOSIS — D631 Anemia in chronic kidney disease: Secondary | ICD-10-CM | POA: Diagnosis not present

## 2018-01-12 DIAGNOSIS — Z4902 Encounter for fitting and adjustment of peritoneal dialysis catheter: Secondary | ICD-10-CM | POA: Diagnosis not present

## 2018-01-12 DIAGNOSIS — D509 Iron deficiency anemia, unspecified: Secondary | ICD-10-CM | POA: Diagnosis not present

## 2018-01-12 DIAGNOSIS — D631 Anemia in chronic kidney disease: Secondary | ICD-10-CM | POA: Diagnosis not present

## 2018-01-12 DIAGNOSIS — Z992 Dependence on renal dialysis: Secondary | ICD-10-CM | POA: Diagnosis not present

## 2018-01-12 DIAGNOSIS — Z79899 Other long term (current) drug therapy: Secondary | ICD-10-CM | POA: Diagnosis not present

## 2018-01-12 DIAGNOSIS — N186 End stage renal disease: Secondary | ICD-10-CM | POA: Diagnosis not present

## 2018-01-13 DIAGNOSIS — Z79899 Other long term (current) drug therapy: Secondary | ICD-10-CM | POA: Diagnosis not present

## 2018-01-13 DIAGNOSIS — N186 End stage renal disease: Secondary | ICD-10-CM | POA: Diagnosis not present

## 2018-01-13 DIAGNOSIS — D509 Iron deficiency anemia, unspecified: Secondary | ICD-10-CM | POA: Diagnosis not present

## 2018-01-13 DIAGNOSIS — Z4902 Encounter for fitting and adjustment of peritoneal dialysis catheter: Secondary | ICD-10-CM | POA: Diagnosis not present

## 2018-01-13 DIAGNOSIS — D631 Anemia in chronic kidney disease: Secondary | ICD-10-CM | POA: Diagnosis not present

## 2018-01-13 DIAGNOSIS — Z992 Dependence on renal dialysis: Secondary | ICD-10-CM | POA: Diagnosis not present

## 2018-01-14 DIAGNOSIS — Z992 Dependence on renal dialysis: Secondary | ICD-10-CM | POA: Diagnosis not present

## 2018-01-14 DIAGNOSIS — D509 Iron deficiency anemia, unspecified: Secondary | ICD-10-CM | POA: Diagnosis not present

## 2018-01-14 DIAGNOSIS — Z4902 Encounter for fitting and adjustment of peritoneal dialysis catheter: Secondary | ICD-10-CM | POA: Diagnosis not present

## 2018-01-14 DIAGNOSIS — Z79899 Other long term (current) drug therapy: Secondary | ICD-10-CM | POA: Diagnosis not present

## 2018-01-14 DIAGNOSIS — N186 End stage renal disease: Secondary | ICD-10-CM | POA: Diagnosis not present

## 2018-01-14 DIAGNOSIS — D631 Anemia in chronic kidney disease: Secondary | ICD-10-CM | POA: Diagnosis not present

## 2018-01-15 DIAGNOSIS — Z79899 Other long term (current) drug therapy: Secondary | ICD-10-CM | POA: Diagnosis not present

## 2018-01-15 DIAGNOSIS — N186 End stage renal disease: Secondary | ICD-10-CM | POA: Diagnosis not present

## 2018-01-15 DIAGNOSIS — Z4902 Encounter for fitting and adjustment of peritoneal dialysis catheter: Secondary | ICD-10-CM | POA: Diagnosis not present

## 2018-01-15 DIAGNOSIS — Z992 Dependence on renal dialysis: Secondary | ICD-10-CM | POA: Diagnosis not present

## 2018-01-15 DIAGNOSIS — D631 Anemia in chronic kidney disease: Secondary | ICD-10-CM | POA: Diagnosis not present

## 2018-01-15 DIAGNOSIS — D509 Iron deficiency anemia, unspecified: Secondary | ICD-10-CM | POA: Diagnosis not present

## 2018-01-16 ENCOUNTER — Telehealth: Payer: Self-pay

## 2018-01-16 DIAGNOSIS — E1129 Type 2 diabetes mellitus with other diabetic kidney complication: Secondary | ICD-10-CM | POA: Diagnosis not present

## 2018-01-16 DIAGNOSIS — Z79899 Other long term (current) drug therapy: Secondary | ICD-10-CM | POA: Diagnosis not present

## 2018-01-16 DIAGNOSIS — D631 Anemia in chronic kidney disease: Secondary | ICD-10-CM | POA: Diagnosis not present

## 2018-01-16 DIAGNOSIS — D509 Iron deficiency anemia, unspecified: Secondary | ICD-10-CM | POA: Diagnosis not present

## 2018-01-16 DIAGNOSIS — N186 End stage renal disease: Secondary | ICD-10-CM | POA: Diagnosis not present

## 2018-01-16 DIAGNOSIS — Z4902 Encounter for fitting and adjustment of peritoneal dialysis catheter: Secondary | ICD-10-CM | POA: Diagnosis not present

## 2018-01-16 DIAGNOSIS — E7849 Other hyperlipidemia: Secondary | ICD-10-CM | POA: Diagnosis not present

## 2018-01-16 DIAGNOSIS — R82998 Other abnormal findings in urine: Secondary | ICD-10-CM | POA: Diagnosis not present

## 2018-01-16 DIAGNOSIS — Z992 Dependence on renal dialysis: Secondary | ICD-10-CM | POA: Diagnosis not present

## 2018-01-16 NOTE — Telephone Encounter (Signed)
Called pt to make them aware that we need new drug insurance card to process pa for repatha pt stated that they have not yet gotten it instructed the pt to call us back when they do get it or better yet to actually bring the card to the office so that we can get it scanned into their chart

## 2018-01-17 ENCOUNTER — Ambulatory Visit (INDEPENDENT_AMBULATORY_CARE_PROVIDER_SITE_OTHER): Payer: Medicare Other | Admitting: Vascular Surgery

## 2018-01-17 ENCOUNTER — Other Ambulatory Visit: Payer: Self-pay

## 2018-01-17 ENCOUNTER — Encounter: Payer: Self-pay | Admitting: Vascular Surgery

## 2018-01-17 VITALS — BP 137/81 | HR 96 | Temp 98.3°F | Resp 16 | Ht 65.0 in | Wt 165.0 lb

## 2018-01-17 DIAGNOSIS — D509 Iron deficiency anemia, unspecified: Secondary | ICD-10-CM | POA: Diagnosis not present

## 2018-01-17 DIAGNOSIS — D631 Anemia in chronic kidney disease: Secondary | ICD-10-CM | POA: Diagnosis not present

## 2018-01-17 DIAGNOSIS — N186 End stage renal disease: Secondary | ICD-10-CM

## 2018-01-17 DIAGNOSIS — Z992 Dependence on renal dialysis: Secondary | ICD-10-CM | POA: Diagnosis not present

## 2018-01-17 DIAGNOSIS — Z79899 Other long term (current) drug therapy: Secondary | ICD-10-CM | POA: Diagnosis not present

## 2018-01-17 DIAGNOSIS — Z4902 Encounter for fitting and adjustment of peritoneal dialysis catheter: Secondary | ICD-10-CM | POA: Diagnosis not present

## 2018-01-17 MED FILL — METOPROLOL SUCCINATE ER 50: 50 | 28 days supply | Qty: 22 | Fill #2

## 2018-01-17 MED FILL — TOUJEO SOLOSTAR 300 UNITS/M: 300 | 27 days supply | Qty: 5 | Fill #2

## 2018-01-17 MED FILL — PANTOPRAZOLE SOD DR 40 MG T: 40 | 30 days supply | Qty: 30 | Fill #2

## 2018-01-17 MED FILL — CLOPIDOGREL 75 MG TABLET: 75 | 30 days supply | Qty: 30 | Fill #2

## 2018-01-17 MED FILL — ISOSORBIDE MN ER 30 MG TAB: 30 | 30 days supply | Qty: 15 | Fill #2

## 2018-01-17 NOTE — Progress Notes (Signed)
Patient name: Susan Horton MRN: [id number] DOB: 12/14/1970 Sex: female  REASON FOR VISIT: Follow-up with concern for steal syndrome left hand  HPI: Susan Horton is a 48 y.o. female with end-stage renal disease that presents for follow-up after superficialization of her left brachiocephalic fistula on 5/0/3546.  She previously complained of numbness in her left hand particularly worse during dialysis.  It should be noted that the numbness in her left hand has been ongoing since her fistula was placed back in January by Dr. [name].  Previously discussed with patient options for ligation versus evaluating access in the other arm by Dr. [name] and she did not wanted her access ligated.   Follow-up today she is now doing peritoneal dialysis.  She states no longer using her left brachiocephalic fistula.  This is encouraging since her pain was typically worse during dialysis and the fistula was in use.  Feels that numbness has been stable in her left hand is not any worse.  Past Medical History:  Diagnosis Date  . Anemia   . Asthma   . CAD (coronary artery disease)    DES to mid LAD July 2018, residual moderate RCA disease  . Cataract   . CKD (chronic kidney disease) stage 4, GFR 15-29 ml/min (HCC)    Dialysis T/Th/Sa  . DVT (deep venous thrombosis) ([place])    1996 during pregnancy, 2015 left leg  . Essential hypertension 12/11/2015  . Gastroparesis   . GERD (gastroesophageal reflux disease)   . Hidradenitis   . Migraine   . Neuropathy   . Peripheral vascular disease ([place])    blood clot in leg  . Type 2 diabetes mellitus ([place])   . Type II diabetes mellitus ([place])     Past Surgical History:  Procedure Laterality Date  . A/V FISTULAGRAM N/A 06/22/2017   Procedure: A/V FISTULAGRAM - left arm;  Surgeon: [name], MD;  Location: [facility];  Service: Cardiovascular;  Laterality: N/A;  . ABDOMINOPLASTY    . APPENDECTOMY  1995  . AV FISTULA PLACEMENT Left 02/01/2017   Procedure: ARTERIOVENOUS BRACHIOCEPHALIC (AV) FISTULA CREATION;  Surgeon: [name], MD;  Location: [facility];  Service: Vascular;  Laterality: Left;  . CHOLECYSTECTOMY  1993  . CORONARY ANGIOPLASTY WITH STENT PLACEMENT    . CORONARY STENT INTERVENTION N/A 08/05/2016   Procedure: Coronary Stent Intervention;  Surgeon: [name], MD;  Location: [facility];  Service: Cardiovascular;  Laterality: N/A;  . EXPLORATORY LAPAROTOMY  08/14/2005   lysis of adhesions, drainage of tubo-ovarian abscess  . FISTULA SUPERFICIALIZATION Left 09/14/2017   Procedure: FISTULA SUPERFICIALIZATION ARTERIOVENOUS FISTULA LEFT ARM;  Surgeon: [name], MD;  Location: [facility];  Service: Vascular;  Laterality: Left;  . HYDRADENITIS EXCISION  02/08/2011   Procedure: EXCISION HYDRADENITIS GROIN;  Surgeon: [name], MD;  Location: [facility];  Service: General;  Laterality: N/A;  Excisioin of Hidradenitis Left groin  . INGUINAL HIDRADENITIS EXCISION  07/06/2010   bilateral  . INSERTION OF DIALYSIS CATHETER N/A 09/14/2017   Procedure: INSERTION OF TUNNELED DIALYSIS CATHETER Right Internal Jugular;  Surgeon: [name], MD;  Location: [facility];  Service: Vascular;  Laterality: N/A;  . LEFT HEART CATH AND CORONARY ANGIOGRAPHY N/A 08/05/2016   Procedure: Left Heart Cath and Coronary Angiography;  Surgeon: [name], MD;  Location: [facility];  Service: Cardiovascular;  Laterality: N/A;  . REDUCTION MAMMAPLASTY  2002  . TONSILLECTOMY    .  TUBAL LIGATION  1996  . VAGINAL HYSTERECTOMY  08/04/2005   and cysto    Family History  Problem Relation Age of Onset  . Hypertension Father   . Hypertension Sister   . Hypertension Brother   . Breast cancer Cousin        x2    SOCIAL HISTORY: Social History   Tobacco Use  . Smoking status: Former Smoker    Years: 20.00    Types: Cigarettes    Last attempt to quit: 11/2016    Years since quitting: 1.1  .  Smokeless tobacco: Never Used  Substance Use Topics  . Alcohol use: No    Alcohol/week: 0.0 standard drinks    Allergies  Allergen Reactions  . Penicillins Anaphylaxis, Itching, Swelling, Rash and Other (See Comments)    Swelling of throat & whole mouth  Has patient had a PCN reaction causing immediate rash, facial/tongue/throat swelling, SOB or lightheadedness with hypotension: Yes Has patient had a PCN reaction causing severe rash involving mucus membranes or skin necrosis: No Has patient had a PCN reaction that required hospitalization: No Has patient had a PCN reaction occurring within the last 10 years: No If all of the above answers are "NO", then may proceed with Cephalosporin use.  [name] Lisinopril Cough    Current Outpatient Medications  Medication Sig Dispense Refill  . albuterol (PROVENTIL HFA;VENTOLIN HFA) 108 (90 BASE) MCG/ACT inhaler Inhale 1-2 puffs into the lungs every 6 (six) hours as needed for wheezing or shortness of breath.     [name] aspirin 81 MG chewable tablet Chew 1 tablet (81 mg total) by mouth daily. 30 tablet 0  . calcium acetate (PHOSLO) 667 MG capsule TAKE 1 CAPSULE BY MOUTH THREE TIMES DAILY WITH MEALS AND 1 CAPSULE TWO TIMES DAILY WITH SNACKS  10  . capsicum (ZOSTRIX) 0.075 % topical cream Apply 1 application topically 2 (two) times daily. 28.3 g 0  . clopidogrel (PLAVIX) 75 MG tablet Take 1 tablet (75 mg total) by mouth daily with breakfast. 90 tablet 3  . Evolocumab (REPATHA SURECLICK) 245 MG/ML SOAJ Inject 140 mg into the skin every 14 (fourteen) days. 2 pen 11  . Ferrous Sulfate (IRON) 325 (65 Fe) MG TABS Take 325 mg by mouth daily. 30 each 3  . fluconazole (DIFLUCAN) 150 MG tablet Take 1 tablet (150 mg total) by mouth daily. 2 tablet 0  . Fluticasone-Salmeterol (ADVAIR) 250-50 MCG/DOSE AEPB Inhale 1 puff into the lungs 2 (two) times daily. (Patient taking differently: Inhale 1 puff into the lungs daily as needed (shortness of breath). ) 60 each 3  . Insulin  Glargine (TOUJEO SOLOSTAR) 300 UNIT/ML SOPN Inject 25 Units into the skin 2 (two) times daily. 45 mL 3  . insulin regular (HUMULIN R) 100 units/mL injection Inject 0.2-0.25 mLs (20-25 Units total) into the skin See admin instructions. 45 mL 3  . isosorbide mononitrate (IMDUR) 30 MG 24 hr tablet Take 0.5 tablets (15 mg total) by mouth daily. 45 tablet 3  . lidocaine (XYLOCAINE) 5 % ointment Apply 1 application topically as needed. 35.44 g 0  . lidocaine-prilocaine (EMLA) cream APPLY SMALL AMOUNT TO ACCESS SITE (AVF) 30 MINUTES BEFORE DIALYSIS. COVER WITH OCCLUSIVE DRESSING (SARAN WRAP) THREE DAYS A WEEK (3 TIMES A  5  . lovastatin (MEVACOR) 20 MG tablet Take 20 mg by mouth at bedtime.     . methocarbamol (ROBAXIN) 500 MG tablet Take 1 tablet (500 mg total) by mouth 2 (two) times daily as needed for  muscle spasms. 60 tablet 1  . metoCLOPramide (REGLAN) 5 MG tablet TAKE 1 TABLET BY MOUTH 4 TIMES DAILY BEFORE MEALS AND AT BEDTIME. (Patient taking differently: Take 5 mg by mouth 4 (four) times daily -  before meals and at bedtime. ) 90 tablet 0  . metoprolol succinate (TOPROL-XL) 50 MG 24 hr tablet Take 1 tablet (50 mg total) by mouth See admin instructions. Take 25 mg on Tuesday ,Thursday and Saturday, and 50 mg on M,W, F, Sunday 90 tablet 3  . montelukast (SINGULAIR) 10 MG tablet Take 10 mg by mouth daily as needed (allergies).     . multivitamin (RENA-VIT) TABS tablet Take 1 tablet by mouth at bedtime.  0  . nitroGLYCERIN (NITROSTAT) 0.4 MG SL tablet Place 1 tablet (0.4 mg total) under the tongue every 5 (five) minutes as needed for chest pain. 25 tablet 2  . pantoprazole (PROTONIX) 40 MG tablet TAKE 1 TABLET BY MOUTH DAILY. 30 tablet 2  . pregabalin (LYRICA) 75 MG capsule Take 1 capsule (75 mg total) by mouth 2 (two) times daily. 30 capsule 1  . senna-docusate (SENOKOT-S) 8.6-50 MG tablet Take 1 tablet by mouth 2 (two) times daily. (Patient taking differently: Take 1 tablet by mouth daily as needed for  moderate constipation. )    . trimethoprim-polymyxin b (POLYTRIM) ophthalmic solution INSTILL ONE DROP INTO BOTH EYES 4 TIMES A DAY FOR 2 DAYS AFTER EACH MONTHLY EYE INJECTION  12   No current facility-administered medications for this visit.     REVIEW OF SYSTEMS:  [X]  denotes positive finding, [ ]  denotes negative finding Cardiac  Comments:  Chest pain or chest pressure:    Shortness of breath upon exertion:    Short of breath when lying flat:    Irregular heart rhythm:        Vascular    Pain in calf, thigh, or hip brought on by ambulation:    Pain in feet at night that wakes you up from your sleep:     Blood clot in your veins:    Leg swelling:         Pulmonary    Oxygen at home:    Productive cough:     Wheezing:         Neurologic    Sudden weakness in arms or legs:     Sudden numbness in arms or legs:     Sudden onset of difficulty speaking or slurred speech:    Temporary loss of vision in one eye:     Problems with dizziness:     Numbness in left hand x   Gastrointestinal    Blood in stool:     Vomited blood:         Genitourinary    Burning when urinating:     Blood in urine:        Psychiatric    Major depression:         Hematologic    Bleeding problems:    Problems with blood clotting too easily:        Skin    Rashes or ulcers:        Constitutional    Fever or chills:      PHYSICAL EXAM: Vitals:   01/17/18 0954  BP: 137/81  Pulse: 96  Resp: 16  Temp: 98.3 F (36.8 C)  TempSrc: Oral  SpO2: 100%  Weight: 165 lb (74.8 kg)  Height: 5\' 5"  (1.651 m)    GENERAL: The patient  is a well-nourished female, in no acute distress. The vital signs are documented above. CARDIAC: There is a regular rate and rhythm.  VASCULAR:  Left brachiocephalic fistula with good thrill PULMONARY: There is good air exchange bilaterally without wheezing or rales. MUSCULOSKELETAL: There are no major deformities or cyanosis. NEUROLOGIC: No focal weakness on exam.   Grip strength in left hand equal with right hand. SKIN: There are no ulcers or rashes noted. PSYCHIATRIC: The patient has a normal affect.  DATA:   No imaging to review  Assessment/Plan:  48 year old female status post left brachiocephalic AV fistula superficialization in 09/2017.  Her fistula still has a good thrill on exam today.  Fortunately she is no longer using the fistula since she now has a PD catheter and is doing PD dialysis at home.  We discussed options of ligating her fistula, but noted that if her PD catheter ever has problems or gets infected she would need to be evaluated for new access.  Ultimately her decision is to keep the fistula at this time since her symptoms have been stable.  I offered her follow-up PRN.  [name], MD [facility] Office: [phone] Pager: [phone]

## 2018-01-18 DIAGNOSIS — Z992 Dependence on renal dialysis: Secondary | ICD-10-CM | POA: Diagnosis not present

## 2018-01-18 DIAGNOSIS — D631 Anemia in chronic kidney disease: Secondary | ICD-10-CM | POA: Diagnosis not present

## 2018-01-18 DIAGNOSIS — Z4902 Encounter for fitting and adjustment of peritoneal dialysis catheter: Secondary | ICD-10-CM | POA: Diagnosis not present

## 2018-01-18 DIAGNOSIS — Z79899 Other long term (current) drug therapy: Secondary | ICD-10-CM | POA: Diagnosis not present

## 2018-01-18 DIAGNOSIS — N186 End stage renal disease: Secondary | ICD-10-CM | POA: Diagnosis not present

## 2018-01-18 DIAGNOSIS — D509 Iron deficiency anemia, unspecified: Secondary | ICD-10-CM | POA: Diagnosis not present

## 2018-01-19 DIAGNOSIS — Z4902 Encounter for fitting and adjustment of peritoneal dialysis catheter: Secondary | ICD-10-CM | POA: Diagnosis not present

## 2018-01-19 DIAGNOSIS — D509 Iron deficiency anemia, unspecified: Secondary | ICD-10-CM | POA: Diagnosis not present

## 2018-01-19 DIAGNOSIS — Z992 Dependence on renal dialysis: Secondary | ICD-10-CM | POA: Diagnosis not present

## 2018-01-19 DIAGNOSIS — Z79899 Other long term (current) drug therapy: Secondary | ICD-10-CM | POA: Diagnosis not present

## 2018-01-19 DIAGNOSIS — N186 End stage renal disease: Secondary | ICD-10-CM | POA: Diagnosis not present

## 2018-01-19 DIAGNOSIS — D631 Anemia in chronic kidney disease: Secondary | ICD-10-CM | POA: Diagnosis not present

## 2018-01-20 DIAGNOSIS — Z992 Dependence on renal dialysis: Secondary | ICD-10-CM | POA: Diagnosis not present

## 2018-01-20 DIAGNOSIS — Z4902 Encounter for fitting and adjustment of peritoneal dialysis catheter: Secondary | ICD-10-CM | POA: Diagnosis not present

## 2018-01-20 DIAGNOSIS — D631 Anemia in chronic kidney disease: Secondary | ICD-10-CM | POA: Diagnosis not present

## 2018-01-20 DIAGNOSIS — N186 End stage renal disease: Secondary | ICD-10-CM | POA: Diagnosis not present

## 2018-01-20 DIAGNOSIS — D509 Iron deficiency anemia, unspecified: Secondary | ICD-10-CM | POA: Diagnosis not present

## 2018-01-20 DIAGNOSIS — Z79899 Other long term (current) drug therapy: Secondary | ICD-10-CM | POA: Diagnosis not present

## 2018-01-21 DIAGNOSIS — Z79899 Other long term (current) drug therapy: Secondary | ICD-10-CM | POA: Diagnosis not present

## 2018-01-21 DIAGNOSIS — N186 End stage renal disease: Secondary | ICD-10-CM | POA: Diagnosis not present

## 2018-01-21 DIAGNOSIS — Z992 Dependence on renal dialysis: Secondary | ICD-10-CM | POA: Diagnosis not present

## 2018-01-21 DIAGNOSIS — D631 Anemia in chronic kidney disease: Secondary | ICD-10-CM | POA: Diagnosis not present

## 2018-01-21 DIAGNOSIS — Z4902 Encounter for fitting and adjustment of peritoneal dialysis catheter: Secondary | ICD-10-CM | POA: Diagnosis not present

## 2018-01-21 DIAGNOSIS — D509 Iron deficiency anemia, unspecified: Secondary | ICD-10-CM | POA: Diagnosis not present

## 2018-01-22 DIAGNOSIS — Z992 Dependence on renal dialysis: Secondary | ICD-10-CM | POA: Diagnosis not present

## 2018-01-22 DIAGNOSIS — N186 End stage renal disease: Secondary | ICD-10-CM | POA: Diagnosis not present

## 2018-01-22 DIAGNOSIS — Z79899 Other long term (current) drug therapy: Secondary | ICD-10-CM | POA: Diagnosis not present

## 2018-01-22 DIAGNOSIS — Z4902 Encounter for fitting and adjustment of peritoneal dialysis catheter: Secondary | ICD-10-CM | POA: Diagnosis not present

## 2018-01-22 DIAGNOSIS — D631 Anemia in chronic kidney disease: Secondary | ICD-10-CM | POA: Diagnosis not present

## 2018-01-22 DIAGNOSIS — D509 Iron deficiency anemia, unspecified: Secondary | ICD-10-CM | POA: Diagnosis not present

## 2018-01-23 DIAGNOSIS — Z4902 Encounter for fitting and adjustment of peritoneal dialysis catheter: Secondary | ICD-10-CM | POA: Diagnosis not present

## 2018-01-23 DIAGNOSIS — Z992 Dependence on renal dialysis: Secondary | ICD-10-CM | POA: Diagnosis not present

## 2018-01-23 DIAGNOSIS — D509 Iron deficiency anemia, unspecified: Secondary | ICD-10-CM | POA: Diagnosis not present

## 2018-01-23 DIAGNOSIS — D631 Anemia in chronic kidney disease: Secondary | ICD-10-CM | POA: Diagnosis not present

## 2018-01-23 DIAGNOSIS — Z79899 Other long term (current) drug therapy: Secondary | ICD-10-CM | POA: Diagnosis not present

## 2018-01-23 DIAGNOSIS — N186 End stage renal disease: Secondary | ICD-10-CM | POA: Diagnosis not present

## 2018-01-24 DIAGNOSIS — Z4902 Encounter for fitting and adjustment of peritoneal dialysis catheter: Secondary | ICD-10-CM | POA: Diagnosis not present

## 2018-01-24 DIAGNOSIS — D631 Anemia in chronic kidney disease: Secondary | ICD-10-CM | POA: Diagnosis not present

## 2018-01-24 DIAGNOSIS — N186 End stage renal disease: Secondary | ICD-10-CM | POA: Diagnosis not present

## 2018-01-24 DIAGNOSIS — D509 Iron deficiency anemia, unspecified: Secondary | ICD-10-CM | POA: Diagnosis not present

## 2018-01-24 DIAGNOSIS — Z992 Dependence on renal dialysis: Secondary | ICD-10-CM | POA: Diagnosis not present

## 2018-01-24 DIAGNOSIS — Z79899 Other long term (current) drug therapy: Secondary | ICD-10-CM | POA: Diagnosis not present

## 2018-01-25 DIAGNOSIS — Z4902 Encounter for fitting and adjustment of peritoneal dialysis catheter: Secondary | ICD-10-CM | POA: Diagnosis not present

## 2018-01-25 DIAGNOSIS — D631 Anemia in chronic kidney disease: Secondary | ICD-10-CM | POA: Diagnosis not present

## 2018-01-25 DIAGNOSIS — Z79899 Other long term (current) drug therapy: Secondary | ICD-10-CM | POA: Diagnosis not present

## 2018-01-25 DIAGNOSIS — N186 End stage renal disease: Secondary | ICD-10-CM | POA: Diagnosis not present

## 2018-01-25 DIAGNOSIS — Z992 Dependence on renal dialysis: Secondary | ICD-10-CM | POA: Diagnosis not present

## 2018-01-25 DIAGNOSIS — D509 Iron deficiency anemia, unspecified: Secondary | ICD-10-CM | POA: Diagnosis not present

## 2018-01-26 DIAGNOSIS — N186 End stage renal disease: Secondary | ICD-10-CM | POA: Diagnosis not present

## 2018-01-26 DIAGNOSIS — D509 Iron deficiency anemia, unspecified: Secondary | ICD-10-CM | POA: Diagnosis not present

## 2018-01-26 DIAGNOSIS — D631 Anemia in chronic kidney disease: Secondary | ICD-10-CM | POA: Diagnosis not present

## 2018-01-26 DIAGNOSIS — Z992 Dependence on renal dialysis: Secondary | ICD-10-CM | POA: Diagnosis not present

## 2018-01-26 DIAGNOSIS — Z4902 Encounter for fitting and adjustment of peritoneal dialysis catheter: Secondary | ICD-10-CM | POA: Diagnosis not present

## 2018-01-26 DIAGNOSIS — Z79899 Other long term (current) drug therapy: Secondary | ICD-10-CM | POA: Diagnosis not present

## 2018-01-27 DIAGNOSIS — Z992 Dependence on renal dialysis: Secondary | ICD-10-CM | POA: Diagnosis not present

## 2018-01-27 DIAGNOSIS — Z79899 Other long term (current) drug therapy: Secondary | ICD-10-CM | POA: Diagnosis not present

## 2018-01-27 DIAGNOSIS — D509 Iron deficiency anemia, unspecified: Secondary | ICD-10-CM | POA: Diagnosis not present

## 2018-01-27 DIAGNOSIS — Z4902 Encounter for fitting and adjustment of peritoneal dialysis catheter: Secondary | ICD-10-CM | POA: Diagnosis not present

## 2018-01-27 DIAGNOSIS — D631 Anemia in chronic kidney disease: Secondary | ICD-10-CM | POA: Diagnosis not present

## 2018-01-27 DIAGNOSIS — N186 End stage renal disease: Secondary | ICD-10-CM | POA: Diagnosis not present

## 2018-01-28 DIAGNOSIS — N186 End stage renal disease: Secondary | ICD-10-CM | POA: Diagnosis not present

## 2018-01-28 DIAGNOSIS — Z4902 Encounter for fitting and adjustment of peritoneal dialysis catheter: Secondary | ICD-10-CM | POA: Diagnosis not present

## 2018-01-28 DIAGNOSIS — D509 Iron deficiency anemia, unspecified: Secondary | ICD-10-CM | POA: Diagnosis not present

## 2018-01-28 DIAGNOSIS — Z79899 Other long term (current) drug therapy: Secondary | ICD-10-CM | POA: Diagnosis not present

## 2018-01-28 DIAGNOSIS — D631 Anemia in chronic kidney disease: Secondary | ICD-10-CM | POA: Diagnosis not present

## 2018-01-28 DIAGNOSIS — Z992 Dependence on renal dialysis: Secondary | ICD-10-CM | POA: Diagnosis not present

## 2018-01-29 DIAGNOSIS — D631 Anemia in chronic kidney disease: Secondary | ICD-10-CM | POA: Diagnosis not present

## 2018-01-29 DIAGNOSIS — D509 Iron deficiency anemia, unspecified: Secondary | ICD-10-CM | POA: Diagnosis not present

## 2018-01-29 DIAGNOSIS — Z4902 Encounter for fitting and adjustment of peritoneal dialysis catheter: Secondary | ICD-10-CM | POA: Diagnosis not present

## 2018-01-29 DIAGNOSIS — Z992 Dependence on renal dialysis: Secondary | ICD-10-CM | POA: Diagnosis not present

## 2018-01-29 DIAGNOSIS — N186 End stage renal disease: Secondary | ICD-10-CM | POA: Diagnosis not present

## 2018-01-29 DIAGNOSIS — Z79899 Other long term (current) drug therapy: Secondary | ICD-10-CM | POA: Diagnosis not present

## 2018-01-30 DIAGNOSIS — Z79899 Other long term (current) drug therapy: Secondary | ICD-10-CM | POA: Diagnosis not present

## 2018-01-30 DIAGNOSIS — N186 End stage renal disease: Secondary | ICD-10-CM | POA: Diagnosis not present

## 2018-01-30 DIAGNOSIS — Z992 Dependence on renal dialysis: Secondary | ICD-10-CM | POA: Diagnosis not present

## 2018-01-30 DIAGNOSIS — D631 Anemia in chronic kidney disease: Secondary | ICD-10-CM | POA: Diagnosis not present

## 2018-01-30 DIAGNOSIS — D509 Iron deficiency anemia, unspecified: Secondary | ICD-10-CM | POA: Diagnosis not present

## 2018-01-30 DIAGNOSIS — Z4902 Encounter for fitting and adjustment of peritoneal dialysis catheter: Secondary | ICD-10-CM | POA: Diagnosis not present

## 2018-01-31 DIAGNOSIS — Z4902 Encounter for fitting and adjustment of peritoneal dialysis catheter: Secondary | ICD-10-CM | POA: Diagnosis not present

## 2018-01-31 DIAGNOSIS — Z992 Dependence on renal dialysis: Secondary | ICD-10-CM | POA: Diagnosis not present

## 2018-01-31 DIAGNOSIS — N186 End stage renal disease: Secondary | ICD-10-CM | POA: Diagnosis not present

## 2018-01-31 DIAGNOSIS — D631 Anemia in chronic kidney disease: Secondary | ICD-10-CM | POA: Diagnosis not present

## 2018-01-31 DIAGNOSIS — Z79899 Other long term (current) drug therapy: Secondary | ICD-10-CM | POA: Diagnosis not present

## 2018-01-31 DIAGNOSIS — D509 Iron deficiency anemia, unspecified: Secondary | ICD-10-CM | POA: Diagnosis not present

## 2018-02-01 DIAGNOSIS — Z79899 Other long term (current) drug therapy: Secondary | ICD-10-CM | POA: Diagnosis not present

## 2018-02-01 DIAGNOSIS — D509 Iron deficiency anemia, unspecified: Secondary | ICD-10-CM | POA: Diagnosis not present

## 2018-02-01 DIAGNOSIS — D631 Anemia in chronic kidney disease: Secondary | ICD-10-CM | POA: Diagnosis not present

## 2018-02-01 DIAGNOSIS — Z4902 Encounter for fitting and adjustment of peritoneal dialysis catheter: Secondary | ICD-10-CM | POA: Diagnosis not present

## 2018-02-01 DIAGNOSIS — N186 End stage renal disease: Secondary | ICD-10-CM | POA: Diagnosis not present

## 2018-02-01 DIAGNOSIS — Z992 Dependence on renal dialysis: Secondary | ICD-10-CM | POA: Diagnosis not present

## 2018-02-02 DIAGNOSIS — D509 Iron deficiency anemia, unspecified: Secondary | ICD-10-CM | POA: Diagnosis not present

## 2018-02-02 DIAGNOSIS — N186 End stage renal disease: Secondary | ICD-10-CM | POA: Diagnosis not present

## 2018-02-02 DIAGNOSIS — Z4902 Encounter for fitting and adjustment of peritoneal dialysis catheter: Secondary | ICD-10-CM | POA: Diagnosis not present

## 2018-02-02 DIAGNOSIS — Z79899 Other long term (current) drug therapy: Secondary | ICD-10-CM | POA: Diagnosis not present

## 2018-02-02 DIAGNOSIS — D631 Anemia in chronic kidney disease: Secondary | ICD-10-CM | POA: Diagnosis not present

## 2018-02-02 DIAGNOSIS — Z992 Dependence on renal dialysis: Secondary | ICD-10-CM | POA: Diagnosis not present

## 2018-02-03 DIAGNOSIS — D509 Iron deficiency anemia, unspecified: Secondary | ICD-10-CM | POA: Diagnosis not present

## 2018-02-03 DIAGNOSIS — Z4902 Encounter for fitting and adjustment of peritoneal dialysis catheter: Secondary | ICD-10-CM | POA: Diagnosis not present

## 2018-02-03 DIAGNOSIS — Z992 Dependence on renal dialysis: Secondary | ICD-10-CM | POA: Diagnosis not present

## 2018-02-03 DIAGNOSIS — Z79899 Other long term (current) drug therapy: Secondary | ICD-10-CM | POA: Diagnosis not present

## 2018-02-03 DIAGNOSIS — D631 Anemia in chronic kidney disease: Secondary | ICD-10-CM | POA: Diagnosis not present

## 2018-02-03 DIAGNOSIS — N186 End stage renal disease: Secondary | ICD-10-CM | POA: Diagnosis not present

## 2018-02-04 DIAGNOSIS — D631 Anemia in chronic kidney disease: Secondary | ICD-10-CM | POA: Diagnosis not present

## 2018-02-04 DIAGNOSIS — Z992 Dependence on renal dialysis: Secondary | ICD-10-CM | POA: Diagnosis not present

## 2018-02-04 DIAGNOSIS — Z4902 Encounter for fitting and adjustment of peritoneal dialysis catheter: Secondary | ICD-10-CM | POA: Diagnosis not present

## 2018-02-04 DIAGNOSIS — D509 Iron deficiency anemia, unspecified: Secondary | ICD-10-CM | POA: Diagnosis not present

## 2018-02-04 DIAGNOSIS — N186 End stage renal disease: Secondary | ICD-10-CM | POA: Diagnosis not present

## 2018-02-04 DIAGNOSIS — Z79899 Other long term (current) drug therapy: Secondary | ICD-10-CM | POA: Diagnosis not present

## 2018-02-05 DIAGNOSIS — D631 Anemia in chronic kidney disease: Secondary | ICD-10-CM | POA: Diagnosis not present

## 2018-02-05 DIAGNOSIS — Z79899 Other long term (current) drug therapy: Secondary | ICD-10-CM | POA: Diagnosis not present

## 2018-02-05 DIAGNOSIS — N186 End stage renal disease: Secondary | ICD-10-CM | POA: Diagnosis not present

## 2018-02-05 DIAGNOSIS — Z4902 Encounter for fitting and adjustment of peritoneal dialysis catheter: Secondary | ICD-10-CM | POA: Diagnosis not present

## 2018-02-05 DIAGNOSIS — D509 Iron deficiency anemia, unspecified: Secondary | ICD-10-CM | POA: Diagnosis not present

## 2018-02-05 DIAGNOSIS — Z992 Dependence on renal dialysis: Secondary | ICD-10-CM | POA: Diagnosis not present

## 2018-02-06 DIAGNOSIS — Z992 Dependence on renal dialysis: Secondary | ICD-10-CM | POA: Diagnosis not present

## 2018-02-06 DIAGNOSIS — Z4902 Encounter for fitting and adjustment of peritoneal dialysis catheter: Secondary | ICD-10-CM | POA: Diagnosis not present

## 2018-02-06 DIAGNOSIS — D509 Iron deficiency anemia, unspecified: Secondary | ICD-10-CM | POA: Diagnosis not present

## 2018-02-06 DIAGNOSIS — D631 Anemia in chronic kidney disease: Secondary | ICD-10-CM | POA: Diagnosis not present

## 2018-02-06 DIAGNOSIS — Z79899 Other long term (current) drug therapy: Secondary | ICD-10-CM | POA: Diagnosis not present

## 2018-02-06 DIAGNOSIS — N186 End stage renal disease: Secondary | ICD-10-CM | POA: Diagnosis not present

## 2018-02-07 DIAGNOSIS — N186 End stage renal disease: Secondary | ICD-10-CM | POA: Diagnosis not present

## 2018-02-07 DIAGNOSIS — Z79899 Other long term (current) drug therapy: Secondary | ICD-10-CM | POA: Diagnosis not present

## 2018-02-07 DIAGNOSIS — D509 Iron deficiency anemia, unspecified: Secondary | ICD-10-CM | POA: Diagnosis not present

## 2018-02-07 DIAGNOSIS — Z992 Dependence on renal dialysis: Secondary | ICD-10-CM | POA: Diagnosis not present

## 2018-02-07 DIAGNOSIS — Z4902 Encounter for fitting and adjustment of peritoneal dialysis catheter: Secondary | ICD-10-CM | POA: Diagnosis not present

## 2018-02-07 DIAGNOSIS — D631 Anemia in chronic kidney disease: Secondary | ICD-10-CM | POA: Diagnosis not present

## 2018-02-08 DIAGNOSIS — Z992 Dependence on renal dialysis: Secondary | ICD-10-CM | POA: Diagnosis not present

## 2018-02-08 DIAGNOSIS — Z79899 Other long term (current) drug therapy: Secondary | ICD-10-CM | POA: Diagnosis not present

## 2018-02-08 DIAGNOSIS — Z4902 Encounter for fitting and adjustment of peritoneal dialysis catheter: Secondary | ICD-10-CM | POA: Diagnosis not present

## 2018-02-08 DIAGNOSIS — N186 End stage renal disease: Secondary | ICD-10-CM | POA: Diagnosis not present

## 2018-02-08 DIAGNOSIS — D631 Anemia in chronic kidney disease: Secondary | ICD-10-CM | POA: Diagnosis not present

## 2018-02-08 DIAGNOSIS — D509 Iron deficiency anemia, unspecified: Secondary | ICD-10-CM | POA: Diagnosis not present

## 2018-02-09 DIAGNOSIS — D631 Anemia in chronic kidney disease: Secondary | ICD-10-CM | POA: Diagnosis not present

## 2018-02-09 DIAGNOSIS — D509 Iron deficiency anemia, unspecified: Secondary | ICD-10-CM | POA: Diagnosis not present

## 2018-02-09 DIAGNOSIS — Z4902 Encounter for fitting and adjustment of peritoneal dialysis catheter: Secondary | ICD-10-CM | POA: Diagnosis not present

## 2018-02-09 DIAGNOSIS — Z79899 Other long term (current) drug therapy: Secondary | ICD-10-CM | POA: Diagnosis not present

## 2018-02-09 DIAGNOSIS — Z992 Dependence on renal dialysis: Secondary | ICD-10-CM | POA: Diagnosis not present

## 2018-02-09 DIAGNOSIS — N186 End stage renal disease: Secondary | ICD-10-CM | POA: Diagnosis not present

## 2018-02-10 DIAGNOSIS — Z79899 Other long term (current) drug therapy: Secondary | ICD-10-CM | POA: Diagnosis not present

## 2018-02-10 DIAGNOSIS — Z992 Dependence on renal dialysis: Secondary | ICD-10-CM | POA: Diagnosis not present

## 2018-02-10 DIAGNOSIS — D631 Anemia in chronic kidney disease: Secondary | ICD-10-CM | POA: Diagnosis not present

## 2018-02-10 DIAGNOSIS — Z4902 Encounter for fitting and adjustment of peritoneal dialysis catheter: Secondary | ICD-10-CM | POA: Diagnosis not present

## 2018-02-10 DIAGNOSIS — D509 Iron deficiency anemia, unspecified: Secondary | ICD-10-CM | POA: Diagnosis not present

## 2018-02-10 DIAGNOSIS — N186 End stage renal disease: Secondary | ICD-10-CM | POA: Diagnosis not present

## 2018-02-11 DIAGNOSIS — D631 Anemia in chronic kidney disease: Secondary | ICD-10-CM | POA: Diagnosis not present

## 2018-02-11 DIAGNOSIS — N186 End stage renal disease: Secondary | ICD-10-CM | POA: Diagnosis not present

## 2018-02-11 DIAGNOSIS — Z79899 Other long term (current) drug therapy: Secondary | ICD-10-CM | POA: Diagnosis not present

## 2018-02-11 DIAGNOSIS — E1122 Type 2 diabetes mellitus with diabetic chronic kidney disease: Secondary | ICD-10-CM | POA: Diagnosis not present

## 2018-02-11 DIAGNOSIS — N2581 Secondary hyperparathyroidism of renal origin: Secondary | ICD-10-CM | POA: Diagnosis not present

## 2018-02-11 DIAGNOSIS — E1129 Type 2 diabetes mellitus with other diabetic kidney complication: Secondary | ICD-10-CM | POA: Diagnosis not present

## 2018-02-11 DIAGNOSIS — D509 Iron deficiency anemia, unspecified: Secondary | ICD-10-CM | POA: Diagnosis not present

## 2018-02-11 DIAGNOSIS — Z992 Dependence on renal dialysis: Secondary | ICD-10-CM | POA: Diagnosis not present

## 2018-02-12 DIAGNOSIS — Z79899 Other long term (current) drug therapy: Secondary | ICD-10-CM | POA: Diagnosis not present

## 2018-02-12 DIAGNOSIS — Z992 Dependence on renal dialysis: Secondary | ICD-10-CM | POA: Diagnosis not present

## 2018-02-12 DIAGNOSIS — N186 End stage renal disease: Secondary | ICD-10-CM | POA: Diagnosis not present

## 2018-02-12 DIAGNOSIS — D509 Iron deficiency anemia, unspecified: Secondary | ICD-10-CM | POA: Diagnosis not present

## 2018-02-12 DIAGNOSIS — D631 Anemia in chronic kidney disease: Secondary | ICD-10-CM | POA: Diagnosis not present

## 2018-02-13 DIAGNOSIS — Z992 Dependence on renal dialysis: Secondary | ICD-10-CM | POA: Diagnosis not present

## 2018-02-13 DIAGNOSIS — R82998 Other abnormal findings in urine: Secondary | ICD-10-CM | POA: Diagnosis not present

## 2018-02-13 DIAGNOSIS — D509 Iron deficiency anemia, unspecified: Secondary | ICD-10-CM | POA: Diagnosis not present

## 2018-02-13 DIAGNOSIS — Z79899 Other long term (current) drug therapy: Secondary | ICD-10-CM | POA: Diagnosis not present

## 2018-02-13 DIAGNOSIS — N186 End stage renal disease: Secondary | ICD-10-CM | POA: Diagnosis not present

## 2018-02-13 DIAGNOSIS — D631 Anemia in chronic kidney disease: Secondary | ICD-10-CM | POA: Diagnosis not present

## 2018-02-14 ENCOUNTER — Other Ambulatory Visit: Payer: Self-pay | Admitting: Cardiovascular Disease

## 2018-02-14 DIAGNOSIS — D631 Anemia in chronic kidney disease: Secondary | ICD-10-CM | POA: Diagnosis not present

## 2018-02-14 DIAGNOSIS — Z79899 Other long term (current) drug therapy: Secondary | ICD-10-CM | POA: Diagnosis not present

## 2018-02-14 DIAGNOSIS — Z992 Dependence on renal dialysis: Secondary | ICD-10-CM | POA: Diagnosis not present

## 2018-02-14 DIAGNOSIS — N186 End stage renal disease: Secondary | ICD-10-CM | POA: Diagnosis not present

## 2018-02-14 DIAGNOSIS — D509 Iron deficiency anemia, unspecified: Secondary | ICD-10-CM | POA: Diagnosis not present

## 2018-02-14 MED FILL — ISOSORBIDE MN ER 30 MG TAB: 30 | 30 days supply | Qty: 15 | Fill #3

## 2018-02-14 MED FILL — METOPROLOL SUCCINATE ER 50: 50 | 28 days supply | Qty: 22 | Fill #3

## 2018-02-14 MED FILL — CLOPIDOGREL 75 MG TABLET: 75 | 30 days supply | Qty: 30 | Fill #3

## 2018-02-14 MED FILL — TOUJEO SOLOSTAR 300 UNITS/M: 300 | 27 days supply | Qty: 5 | Fill #3

## 2018-02-14 MED FILL — PANTOPRAZOLE SOD DR 40 MG T: 40 | 30 days supply | Qty: 30 | Fill #0

## 2018-02-15 DIAGNOSIS — D509 Iron deficiency anemia, unspecified: Secondary | ICD-10-CM | POA: Diagnosis not present

## 2018-02-15 DIAGNOSIS — Z992 Dependence on renal dialysis: Secondary | ICD-10-CM | POA: Diagnosis not present

## 2018-02-15 DIAGNOSIS — N186 End stage renal disease: Secondary | ICD-10-CM | POA: Diagnosis not present

## 2018-02-15 DIAGNOSIS — D631 Anemia in chronic kidney disease: Secondary | ICD-10-CM | POA: Diagnosis not present

## 2018-02-15 DIAGNOSIS — Z79899 Other long term (current) drug therapy: Secondary | ICD-10-CM | POA: Diagnosis not present

## 2018-02-16 DIAGNOSIS — D509 Iron deficiency anemia, unspecified: Secondary | ICD-10-CM | POA: Diagnosis not present

## 2018-02-16 DIAGNOSIS — N186 End stage renal disease: Secondary | ICD-10-CM | POA: Diagnosis not present

## 2018-02-16 DIAGNOSIS — D631 Anemia in chronic kidney disease: Secondary | ICD-10-CM | POA: Diagnosis not present

## 2018-02-16 DIAGNOSIS — Z79899 Other long term (current) drug therapy: Secondary | ICD-10-CM | POA: Diagnosis not present

## 2018-02-16 DIAGNOSIS — Z992 Dependence on renal dialysis: Secondary | ICD-10-CM | POA: Diagnosis not present

## 2018-02-17 DIAGNOSIS — D509 Iron deficiency anemia, unspecified: Secondary | ICD-10-CM | POA: Diagnosis not present

## 2018-02-17 DIAGNOSIS — D631 Anemia in chronic kidney disease: Secondary | ICD-10-CM | POA: Diagnosis not present

## 2018-02-17 DIAGNOSIS — Z79899 Other long term (current) drug therapy: Secondary | ICD-10-CM | POA: Diagnosis not present

## 2018-02-17 DIAGNOSIS — N186 End stage renal disease: Secondary | ICD-10-CM | POA: Diagnosis not present

## 2018-02-17 DIAGNOSIS — Z992 Dependence on renal dialysis: Secondary | ICD-10-CM | POA: Diagnosis not present

## 2018-02-18 DIAGNOSIS — N186 End stage renal disease: Secondary | ICD-10-CM | POA: Diagnosis not present

## 2018-02-18 DIAGNOSIS — D509 Iron deficiency anemia, unspecified: Secondary | ICD-10-CM | POA: Diagnosis not present

## 2018-02-18 DIAGNOSIS — Z992 Dependence on renal dialysis: Secondary | ICD-10-CM | POA: Diagnosis not present

## 2018-02-18 DIAGNOSIS — D631 Anemia in chronic kidney disease: Secondary | ICD-10-CM | POA: Diagnosis not present

## 2018-02-18 DIAGNOSIS — Z79899 Other long term (current) drug therapy: Secondary | ICD-10-CM | POA: Diagnosis not present

## 2018-02-19 DIAGNOSIS — N186 End stage renal disease: Secondary | ICD-10-CM | POA: Diagnosis not present

## 2018-02-19 DIAGNOSIS — Z992 Dependence on renal dialysis: Secondary | ICD-10-CM | POA: Diagnosis not present

## 2018-02-19 DIAGNOSIS — D509 Iron deficiency anemia, unspecified: Secondary | ICD-10-CM | POA: Diagnosis not present

## 2018-02-19 DIAGNOSIS — Z79899 Other long term (current) drug therapy: Secondary | ICD-10-CM | POA: Diagnosis not present

## 2018-02-19 DIAGNOSIS — D631 Anemia in chronic kidney disease: Secondary | ICD-10-CM | POA: Diagnosis not present

## 2018-02-20 DIAGNOSIS — D509 Iron deficiency anemia, unspecified: Secondary | ICD-10-CM | POA: Diagnosis not present

## 2018-02-20 DIAGNOSIS — Z992 Dependence on renal dialysis: Secondary | ICD-10-CM | POA: Diagnosis not present

## 2018-02-20 DIAGNOSIS — N186 End stage renal disease: Secondary | ICD-10-CM | POA: Diagnosis not present

## 2018-02-20 DIAGNOSIS — D631 Anemia in chronic kidney disease: Secondary | ICD-10-CM | POA: Diagnosis not present

## 2018-02-20 DIAGNOSIS — Z79899 Other long term (current) drug therapy: Secondary | ICD-10-CM | POA: Diagnosis not present

## 2018-02-21 DIAGNOSIS — Z992 Dependence on renal dialysis: Secondary | ICD-10-CM | POA: Diagnosis not present

## 2018-02-21 DIAGNOSIS — D631 Anemia in chronic kidney disease: Secondary | ICD-10-CM | POA: Diagnosis not present

## 2018-02-21 DIAGNOSIS — N186 End stage renal disease: Secondary | ICD-10-CM | POA: Diagnosis not present

## 2018-02-21 DIAGNOSIS — D509 Iron deficiency anemia, unspecified: Secondary | ICD-10-CM | POA: Diagnosis not present

## 2018-02-21 DIAGNOSIS — Z79899 Other long term (current) drug therapy: Secondary | ICD-10-CM | POA: Diagnosis not present

## 2018-02-22 DIAGNOSIS — D631 Anemia in chronic kidney disease: Secondary | ICD-10-CM | POA: Diagnosis not present

## 2018-02-22 DIAGNOSIS — N186 End stage renal disease: Secondary | ICD-10-CM | POA: Diagnosis not present

## 2018-02-22 DIAGNOSIS — D509 Iron deficiency anemia, unspecified: Secondary | ICD-10-CM | POA: Diagnosis not present

## 2018-02-22 DIAGNOSIS — Z992 Dependence on renal dialysis: Secondary | ICD-10-CM | POA: Diagnosis not present

## 2018-02-22 DIAGNOSIS — Z79899 Other long term (current) drug therapy: Secondary | ICD-10-CM | POA: Diagnosis not present

## 2018-02-23 DIAGNOSIS — Z992 Dependence on renal dialysis: Secondary | ICD-10-CM | POA: Diagnosis not present

## 2018-02-23 DIAGNOSIS — Z79899 Other long term (current) drug therapy: Secondary | ICD-10-CM | POA: Diagnosis not present

## 2018-02-23 DIAGNOSIS — D631 Anemia in chronic kidney disease: Secondary | ICD-10-CM | POA: Diagnosis not present

## 2018-02-23 DIAGNOSIS — N186 End stage renal disease: Secondary | ICD-10-CM | POA: Diagnosis not present

## 2018-02-23 DIAGNOSIS — D509 Iron deficiency anemia, unspecified: Secondary | ICD-10-CM | POA: Diagnosis not present

## 2018-02-23 NOTE — Telephone Encounter (Signed)
Called pt to get the most recent drug insurance pt was compliant I instructed pt that I will try to process a pa for the repatha

## 2018-02-24 DIAGNOSIS — D509 Iron deficiency anemia, unspecified: Secondary | ICD-10-CM | POA: Diagnosis not present

## 2018-02-24 DIAGNOSIS — Z992 Dependence on renal dialysis: Secondary | ICD-10-CM | POA: Diagnosis not present

## 2018-02-24 DIAGNOSIS — D631 Anemia in chronic kidney disease: Secondary | ICD-10-CM | POA: Diagnosis not present

## 2018-02-24 DIAGNOSIS — Z79899 Other long term (current) drug therapy: Secondary | ICD-10-CM | POA: Diagnosis not present

## 2018-02-24 DIAGNOSIS — N186 End stage renal disease: Secondary | ICD-10-CM | POA: Diagnosis not present

## 2018-02-25 DIAGNOSIS — Z992 Dependence on renal dialysis: Secondary | ICD-10-CM | POA: Diagnosis not present

## 2018-02-25 DIAGNOSIS — D631 Anemia in chronic kidney disease: Secondary | ICD-10-CM | POA: Diagnosis not present

## 2018-02-25 DIAGNOSIS — N186 End stage renal disease: Secondary | ICD-10-CM | POA: Diagnosis not present

## 2018-02-25 DIAGNOSIS — Z79899 Other long term (current) drug therapy: Secondary | ICD-10-CM | POA: Diagnosis not present

## 2018-02-25 DIAGNOSIS — D509 Iron deficiency anemia, unspecified: Secondary | ICD-10-CM | POA: Diagnosis not present

## 2018-02-26 DIAGNOSIS — D509 Iron deficiency anemia, unspecified: Secondary | ICD-10-CM | POA: Diagnosis not present

## 2018-02-26 DIAGNOSIS — D631 Anemia in chronic kidney disease: Secondary | ICD-10-CM | POA: Diagnosis not present

## 2018-02-26 DIAGNOSIS — N186 End stage renal disease: Secondary | ICD-10-CM | POA: Diagnosis not present

## 2018-02-26 DIAGNOSIS — Z992 Dependence on renal dialysis: Secondary | ICD-10-CM | POA: Diagnosis not present

## 2018-02-26 DIAGNOSIS — Z79899 Other long term (current) drug therapy: Secondary | ICD-10-CM | POA: Diagnosis not present

## 2018-02-27 ENCOUNTER — Telehealth: Payer: Self-pay

## 2018-02-27 DIAGNOSIS — N186 End stage renal disease: Secondary | ICD-10-CM | POA: Diagnosis not present

## 2018-02-27 DIAGNOSIS — Z992 Dependence on renal dialysis: Secondary | ICD-10-CM | POA: Diagnosis not present

## 2018-02-27 DIAGNOSIS — D509 Iron deficiency anemia, unspecified: Secondary | ICD-10-CM | POA: Diagnosis not present

## 2018-02-27 DIAGNOSIS — D631 Anemia in chronic kidney disease: Secondary | ICD-10-CM | POA: Diagnosis not present

## 2018-02-27 DIAGNOSIS — Z79899 Other long term (current) drug therapy: Secondary | ICD-10-CM | POA: Diagnosis not present

## 2018-02-27 MED ORDER — EVOLOCUMAB 140 MG/ML ~~LOC~~ SOAJ: 140.0000 mg | SUBCUTANEOUS | 11 refills | Status: DC

## 2018-02-27 NOTE — Telephone Encounter (Signed)
Called pt to let them know that the repatha was approved and that the rx for the med was sent to their pharmacy and instructed the pt to call us back with a copay amount and if unaffordable to let us know so that we can get set up with pt assistance

## 2018-02-28 DIAGNOSIS — N186 End stage renal disease: Secondary | ICD-10-CM | POA: Diagnosis not present

## 2018-02-28 DIAGNOSIS — D509 Iron deficiency anemia, unspecified: Secondary | ICD-10-CM | POA: Diagnosis not present

## 2018-02-28 DIAGNOSIS — D631 Anemia in chronic kidney disease: Secondary | ICD-10-CM | POA: Diagnosis not present

## 2018-02-28 DIAGNOSIS — Z992 Dependence on renal dialysis: Secondary | ICD-10-CM | POA: Diagnosis not present

## 2018-02-28 DIAGNOSIS — Z79899 Other long term (current) drug therapy: Secondary | ICD-10-CM | POA: Diagnosis not present

## 2018-03-01 DIAGNOSIS — D631 Anemia in chronic kidney disease: Secondary | ICD-10-CM | POA: Diagnosis not present

## 2018-03-01 DIAGNOSIS — N186 End stage renal disease: Secondary | ICD-10-CM | POA: Diagnosis not present

## 2018-03-01 DIAGNOSIS — Z992 Dependence on renal dialysis: Secondary | ICD-10-CM | POA: Diagnosis not present

## 2018-03-01 DIAGNOSIS — D509 Iron deficiency anemia, unspecified: Secondary | ICD-10-CM | POA: Diagnosis not present

## 2018-03-01 DIAGNOSIS — Z79899 Other long term (current) drug therapy: Secondary | ICD-10-CM | POA: Diagnosis not present

## 2018-03-02 DIAGNOSIS — D631 Anemia in chronic kidney disease: Secondary | ICD-10-CM | POA: Diagnosis not present

## 2018-03-02 DIAGNOSIS — N186 End stage renal disease: Secondary | ICD-10-CM | POA: Diagnosis not present

## 2018-03-02 DIAGNOSIS — D509 Iron deficiency anemia, unspecified: Secondary | ICD-10-CM | POA: Diagnosis not present

## 2018-03-02 DIAGNOSIS — Z79899 Other long term (current) drug therapy: Secondary | ICD-10-CM | POA: Diagnosis not present

## 2018-03-02 DIAGNOSIS — Z992 Dependence on renal dialysis: Secondary | ICD-10-CM | POA: Diagnosis not present

## 2018-03-03 DIAGNOSIS — Z992 Dependence on renal dialysis: Secondary | ICD-10-CM | POA: Diagnosis not present

## 2018-03-03 DIAGNOSIS — D631 Anemia in chronic kidney disease: Secondary | ICD-10-CM | POA: Diagnosis not present

## 2018-03-03 DIAGNOSIS — N186 End stage renal disease: Secondary | ICD-10-CM | POA: Diagnosis not present

## 2018-03-03 DIAGNOSIS — Z79899 Other long term (current) drug therapy: Secondary | ICD-10-CM | POA: Diagnosis not present

## 2018-03-03 DIAGNOSIS — D509 Iron deficiency anemia, unspecified: Secondary | ICD-10-CM | POA: Diagnosis not present

## 2018-03-04 DIAGNOSIS — D509 Iron deficiency anemia, unspecified: Secondary | ICD-10-CM | POA: Diagnosis not present

## 2018-03-04 DIAGNOSIS — D631 Anemia in chronic kidney disease: Secondary | ICD-10-CM | POA: Diagnosis not present

## 2018-03-04 DIAGNOSIS — Z79899 Other long term (current) drug therapy: Secondary | ICD-10-CM | POA: Diagnosis not present

## 2018-03-04 DIAGNOSIS — N186 End stage renal disease: Secondary | ICD-10-CM | POA: Diagnosis not present

## 2018-03-04 DIAGNOSIS — Z992 Dependence on renal dialysis: Secondary | ICD-10-CM | POA: Diagnosis not present

## 2018-03-05 DIAGNOSIS — D631 Anemia in chronic kidney disease: Secondary | ICD-10-CM | POA: Diagnosis not present

## 2018-03-05 DIAGNOSIS — Z79899 Other long term (current) drug therapy: Secondary | ICD-10-CM | POA: Diagnosis not present

## 2018-03-05 DIAGNOSIS — N186 End stage renal disease: Secondary | ICD-10-CM | POA: Diagnosis not present

## 2018-03-05 DIAGNOSIS — D509 Iron deficiency anemia, unspecified: Secondary | ICD-10-CM | POA: Diagnosis not present

## 2018-03-05 DIAGNOSIS — Z992 Dependence on renal dialysis: Secondary | ICD-10-CM | POA: Diagnosis not present

## 2018-03-06 DIAGNOSIS — N186 End stage renal disease: Secondary | ICD-10-CM | POA: Diagnosis not present

## 2018-03-06 DIAGNOSIS — D631 Anemia in chronic kidney disease: Secondary | ICD-10-CM | POA: Diagnosis not present

## 2018-03-06 DIAGNOSIS — Z79899 Other long term (current) drug therapy: Secondary | ICD-10-CM | POA: Diagnosis not present

## 2018-03-06 DIAGNOSIS — D509 Iron deficiency anemia, unspecified: Secondary | ICD-10-CM | POA: Diagnosis not present

## 2018-03-06 DIAGNOSIS — Z992 Dependence on renal dialysis: Secondary | ICD-10-CM | POA: Diagnosis not present

## 2018-03-07 DIAGNOSIS — D509 Iron deficiency anemia, unspecified: Secondary | ICD-10-CM | POA: Diagnosis not present

## 2018-03-07 DIAGNOSIS — Z992 Dependence on renal dialysis: Secondary | ICD-10-CM | POA: Diagnosis not present

## 2018-03-07 DIAGNOSIS — D631 Anemia in chronic kidney disease: Secondary | ICD-10-CM | POA: Diagnosis not present

## 2018-03-07 DIAGNOSIS — Z79899 Other long term (current) drug therapy: Secondary | ICD-10-CM | POA: Diagnosis not present

## 2018-03-07 DIAGNOSIS — N186 End stage renal disease: Secondary | ICD-10-CM | POA: Diagnosis not present

## 2018-03-08 ENCOUNTER — Telehealth: Payer: Self-pay | Admitting: Cardiovascular Disease

## 2018-03-08 DIAGNOSIS — Z79899 Other long term (current) drug therapy: Secondary | ICD-10-CM | POA: Diagnosis not present

## 2018-03-08 DIAGNOSIS — Z992 Dependence on renal dialysis: Secondary | ICD-10-CM | POA: Diagnosis not present

## 2018-03-08 DIAGNOSIS — D631 Anemia in chronic kidney disease: Secondary | ICD-10-CM | POA: Diagnosis not present

## 2018-03-08 DIAGNOSIS — N186 End stage renal disease: Secondary | ICD-10-CM | POA: Diagnosis not present

## 2018-03-08 DIAGNOSIS — D509 Iron deficiency anemia, unspecified: Secondary | ICD-10-CM | POA: Diagnosis not present

## 2018-03-08 MED FILL — TOUJEO SOLOSTAR 300 UNITS/M: 300 | 27 days supply | Qty: 5 | Fill #4

## 2018-03-08 NOTE — Telephone Encounter (Signed)
PA approved.

## 2018-03-08 NOTE — Telephone Encounter (Signed)
Called number back. It was covermymeds about Repatha PA if it was still needed. Will route to pharmacy to make aware.

## 2018-03-08 NOTE — Telephone Encounter (Signed)
New Message:   Patient nusre calling patient needs a authorization. Please call back. reference number [id number]

## 2018-03-09 DIAGNOSIS — N186 End stage renal disease: Secondary | ICD-10-CM | POA: Diagnosis not present

## 2018-03-09 DIAGNOSIS — Z992 Dependence on renal dialysis: Secondary | ICD-10-CM | POA: Diagnosis not present

## 2018-03-09 DIAGNOSIS — D509 Iron deficiency anemia, unspecified: Secondary | ICD-10-CM | POA: Diagnosis not present

## 2018-03-09 DIAGNOSIS — Z79899 Other long term (current) drug therapy: Secondary | ICD-10-CM | POA: Diagnosis not present

## 2018-03-09 DIAGNOSIS — D631 Anemia in chronic kidney disease: Secondary | ICD-10-CM | POA: Diagnosis not present

## 2018-03-10 DIAGNOSIS — N186 End stage renal disease: Secondary | ICD-10-CM | POA: Diagnosis not present

## 2018-03-10 DIAGNOSIS — D509 Iron deficiency anemia, unspecified: Secondary | ICD-10-CM | POA: Diagnosis not present

## 2018-03-10 DIAGNOSIS — Z79899 Other long term (current) drug therapy: Secondary | ICD-10-CM | POA: Diagnosis not present

## 2018-03-10 DIAGNOSIS — Z992 Dependence on renal dialysis: Secondary | ICD-10-CM | POA: Diagnosis not present

## 2018-03-10 DIAGNOSIS — D631 Anemia in chronic kidney disease: Secondary | ICD-10-CM | POA: Diagnosis not present

## 2018-03-11 DIAGNOSIS — N186 End stage renal disease: Secondary | ICD-10-CM | POA: Diagnosis not present

## 2018-03-11 DIAGNOSIS — D631 Anemia in chronic kidney disease: Secondary | ICD-10-CM | POA: Diagnosis not present

## 2018-03-11 DIAGNOSIS — D509 Iron deficiency anemia, unspecified: Secondary | ICD-10-CM | POA: Diagnosis not present

## 2018-03-11 DIAGNOSIS — Z992 Dependence on renal dialysis: Secondary | ICD-10-CM | POA: Diagnosis not present

## 2018-03-11 DIAGNOSIS — Z79899 Other long term (current) drug therapy: Secondary | ICD-10-CM | POA: Diagnosis not present

## 2018-03-12 DIAGNOSIS — E1122 Type 2 diabetes mellitus with diabetic chronic kidney disease: Secondary | ICD-10-CM | POA: Diagnosis not present

## 2018-03-12 DIAGNOSIS — N186 End stage renal disease: Secondary | ICD-10-CM | POA: Diagnosis not present

## 2018-03-12 DIAGNOSIS — N2581 Secondary hyperparathyroidism of renal origin: Secondary | ICD-10-CM | POA: Diagnosis not present

## 2018-03-12 DIAGNOSIS — Z79899 Other long term (current) drug therapy: Secondary | ICD-10-CM | POA: Diagnosis not present

## 2018-03-12 DIAGNOSIS — Z992 Dependence on renal dialysis: Secondary | ICD-10-CM | POA: Diagnosis not present

## 2018-03-12 DIAGNOSIS — D631 Anemia in chronic kidney disease: Secondary | ICD-10-CM | POA: Diagnosis not present

## 2018-03-12 DIAGNOSIS — E1129 Type 2 diabetes mellitus with other diabetic kidney complication: Secondary | ICD-10-CM | POA: Diagnosis not present

## 2018-03-12 DIAGNOSIS — D509 Iron deficiency anemia, unspecified: Secondary | ICD-10-CM | POA: Diagnosis not present

## 2018-03-13 DIAGNOSIS — R82998 Other abnormal findings in urine: Secondary | ICD-10-CM | POA: Diagnosis not present

## 2018-03-13 DIAGNOSIS — D509 Iron deficiency anemia, unspecified: Secondary | ICD-10-CM | POA: Diagnosis not present

## 2018-03-13 DIAGNOSIS — N186 End stage renal disease: Secondary | ICD-10-CM | POA: Diagnosis not present

## 2018-03-13 DIAGNOSIS — Z79899 Other long term (current) drug therapy: Secondary | ICD-10-CM | POA: Diagnosis not present

## 2018-03-13 DIAGNOSIS — D631 Anemia in chronic kidney disease: Secondary | ICD-10-CM | POA: Diagnosis not present

## 2018-03-13 DIAGNOSIS — E1129 Type 2 diabetes mellitus with other diabetic kidney complication: Secondary | ICD-10-CM | POA: Diagnosis not present

## 2018-03-14 ENCOUNTER — Telehealth: Payer: Self-pay

## 2018-03-14 ENCOUNTER — Encounter: Payer: Self-pay | Admitting: Cardiovascular Disease

## 2018-03-14 ENCOUNTER — Ambulatory Visit (INDEPENDENT_AMBULATORY_CARE_PROVIDER_SITE_OTHER): Payer: Medicare Other | Admitting: Cardiovascular Disease

## 2018-03-14 DIAGNOSIS — N186 End stage renal disease: Secondary | ICD-10-CM | POA: Diagnosis not present

## 2018-03-14 DIAGNOSIS — I5033 Acute on chronic diastolic (congestive) heart failure: Secondary | ICD-10-CM

## 2018-03-14 DIAGNOSIS — R0989 Other specified symptoms and signs involving the circulatory and respiratory systems: Secondary | ICD-10-CM | POA: Diagnosis not present

## 2018-03-14 DIAGNOSIS — E785 Hyperlipidemia, unspecified: Secondary | ICD-10-CM

## 2018-03-14 DIAGNOSIS — I2510 Atherosclerotic heart disease of native coronary artery without angina pectoris: Secondary | ICD-10-CM

## 2018-03-14 DIAGNOSIS — I10 Essential (primary) hypertension: Secondary | ICD-10-CM | POA: Diagnosis not present

## 2018-03-14 DIAGNOSIS — Z9861 Coronary angioplasty status: Secondary | ICD-10-CM

## 2018-03-14 DIAGNOSIS — D509 Iron deficiency anemia, unspecified: Secondary | ICD-10-CM | POA: Diagnosis not present

## 2018-03-14 DIAGNOSIS — D631 Anemia in chronic kidney disease: Secondary | ICD-10-CM | POA: Diagnosis not present

## 2018-03-14 DIAGNOSIS — E1129 Type 2 diabetes mellitus with other diabetic kidney complication: Secondary | ICD-10-CM | POA: Diagnosis not present

## 2018-03-14 DIAGNOSIS — Z79899 Other long term (current) drug therapy: Secondary | ICD-10-CM | POA: Diagnosis not present

## 2018-03-14 MED ORDER — METOPROLOL SUCCINATE ER 50 MG PO TB24: 50.0000 mg | ORAL_TABLET | Freq: Every day | ORAL | 3 refills | Status: DC

## 2018-03-14 MED FILL — METOPROLOL SUCCINATE ER 50: 50 | 90 days supply | Qty: 90 | Fill #0

## 2018-03-14 MED FILL — REPATHA SURECLICK 140 MG/ML: 140 | 28 days supply | Qty: 2 | Fill #0

## 2018-03-14 NOTE — Progress Notes (Signed)
03/14/2018 Susan Horton   Mar 20, 1970  [id number]  Primary Physician Charlott Rakes, MD Primary Cardiologist: [name] MD [name], [place]  HPI:  Susan Horton is a 48 y.o.  moderately overweight single African-American female mother of 65, grandmother and 4 grandchildren who I last saw in the office 11/09/2017.  She has a history of treated hypertension, hyperlipidemia and diabetes which she's had for the last 22 years. She has CKD 3 with creatinine running in the mid 3 range followed by Dr. Jimmy Footman. She's noticed chest pain over the last 3-4 months occurring several times a week lasting minutes at a time worse with exertion. She had a Myoview stress test was read as intermediate risk with inferior ischemia. Based on this, I decided to proceed with outpatient radial diagnostic coronary angiography using limited contrast. This was performed 08/05/16 the right femoral approach revealed an 80% mid LAD which was stented using a synergy drug-eluting stent reducing 80% stenosis to 0% residual. She did have moderate mid RCA stenosis which was not addressed. She had left main spasm probably related to the guide catheter doing a procedure that responded to intra-coronary nitroglycerin. Her chest pain has resolved and she has stopped smoking since. She did have carotid Dopplers performed because of bruits that showed mild to moderate left ICA stenosis which will be followed on an annual basis. Since I saw her 4 months ago she has done well.  She gets occasional chest pain during hemodialysis.  She recently had a peritoneal dialysis placed yesterday and is planning on transitioning to home peritoneal dialysis.  We have also talked about going on the kidney transplant list although her hemoglobin A1c is still elevated in the 11 range.  She has stopped smoking.  She was recently begun on Repatha for resistant hyperlipidemia but unfortunately there was a lapse in her insurance last several months.   She will restart this.  Since I saw her 6 months ago she is remained stable.  She is tolerating peritoneal dialysis without difficulty.  She denies chest pain or shortness of breath.   Current Meds  Medication Sig  . albuterol (PROVENTIL HFA;VENTOLIN HFA) 108 (90 BASE) MCG/ACT inhaler Inhale 1-2 puffs into the lungs every 6 (six) hours as needed for wheezing or shortness of breath.   [name] aspirin 81 MG chewable tablet Chew 1 tablet (81 mg total) by mouth daily.  . calcium acetate (PHOSLO) 667 MG capsule TAKE 1 CAPSULE BY MOUTH THREE TIMES DAILY WITH MEALS AND 1 CAPSULE TWO TIMES DAILY WITH SNACKS  . capsicum (ZOSTRIX) 0.075 % topical cream Apply 1 application topically 2 (two) times daily.  . clopidogrel (PLAVIX) 75 MG tablet Take 1 tablet (75 mg total) by mouth daily with breakfast.  . Evolocumab (REPATHA SURECLICK) 419 MG/ML SOAJ Inject 140 mg into the skin every 14 (fourteen) days.  . Ferrous Sulfate (IRON) 325 (65 Fe) MG TABS Take 325 mg by mouth daily.  . fluconazole (DIFLUCAN) 150 MG tablet Take 1 tablet (150 mg total) by mouth daily.  . Fluticasone-Salmeterol (ADVAIR) 250-50 MCG/DOSE AEPB Inhale 1 puff into the lungs 2 (two) times daily. (Patient taking differently: Inhale 1 puff into the lungs daily as needed (shortness of breath). )  . Insulin Glargine (TOUJEO SOLOSTAR) 300 UNIT/ML SOPN Inject 25 Units into the skin 2 (two) times daily.  . insulin regular (HUMULIN R) 100 units/mL injection Inject 0.2-0.25 mLs (20-25 Units total) into the skin See admin instructions.  . lidocaine (  XYLOCAINE) 5 % ointment Apply 1 application topically as needed.  . lidocaine-prilocaine (EMLA) cream APPLY SMALL AMOUNT TO ACCESS SITE (AVF) 30 MINUTES BEFORE DIALYSIS. COVER WITH OCCLUSIVE DRESSING (SARAN WRAP) THREE DAYS A WEEK (3 TIMES A  . lovastatin (MEVACOR) 20 MG tablet Take 20 mg by mouth at bedtime.   . methocarbamol (ROBAXIN) 500 MG tablet Take 1 tablet (500 mg total) by mouth 2 (two) times daily as  needed for muscle spasms.  . metoCLOPramide (REGLAN) 5 MG tablet TAKE 1 TABLET BY MOUTH 4 TIMES DAILY BEFORE MEALS AND AT BEDTIME. (Patient taking differently: Take 5 mg by mouth 4 (four) times daily -  before meals and at bedtime. )  . metoprolol succinate (TOPROL-XL) 50 MG 24 hr tablet Take 1 tablet (50 mg total) by mouth See admin instructions. Take 25 mg on Tuesday ,Thursday and Saturday, and 50 mg on M,W, F, Sunday  . montelukast (SINGULAIR) 10 MG tablet Take 10 mg by mouth daily as needed (allergies).   . multivitamin (RENA-VIT) TABS tablet Take 1 tablet by mouth at bedtime.  . nitroGLYCERIN (NITROSTAT) 0.4 MG SL tablet Place 1 tablet (0.4 mg total) under the tongue every 5 (five) minutes as needed for chest pain.  . pantoprazole (PROTONIX) 40 MG tablet TAKE 1 TABLET BY MOUTH DAILY.  [name] pregabalin (LYRICA) 75 MG capsule Take 1 capsule (75 mg total) by mouth 2 (two) times daily.  [name] senna-docusate (SENOKOT-S) 8.6-50 MG tablet Take 1 tablet by mouth 2 (two) times daily. (Patient taking differently: Take 1 tablet by mouth daily as needed for moderate constipation. )  . trimethoprim-polymyxin b (POLYTRIM) ophthalmic solution INSTILL ONE DROP INTO BOTH EYES 4 TIMES A DAY FOR 2 DAYS AFTER EACH MONTHLY EYE INJECTION     Allergies  Allergen Reactions  . Penicillins Anaphylaxis, Itching, Swelling, Rash and Other (See Comments)    Swelling of throat & whole mouth  Has patient had a PCN reaction causing immediate rash, facial/tongue/throat swelling, SOB or lightheadedness with hypotension: Yes Has patient had a PCN reaction causing severe rash involving mucus membranes or skin necrosis: No Has patient had a PCN reaction that required hospitalization: No Has patient had a PCN reaction occurring within the last 10 years: No If all of the above answers are "NO", then may proceed with Cephalosporin use.  [name] Lisinopril Cough    Social History   Socioeconomic History  . Marital status: Single    Spouse  name: Not on file  . Number of children: 3  . Years of education: 35  . Highest education level: Not on file  Occupational History  . Occupation: Curator  Social Needs  . Financial resource strain: Not on file  . Food insecurity:    Worry: Not on file    Inability: Not on file  . Transportation needs:    Medical: Not on file    Non-medical: Not on file  Tobacco Use  . Smoking status: Former Smoker    Years: 20.00    Types: Cigarettes    Last attempt to quit: 11/2016    Years since quitting: 1.3  . Smokeless tobacco: Never Used  Substance and Sexual Activity  . Alcohol use: No    Alcohol/week: 0.0 standard drinks  . Drug use: No  . Sexual activity: Not on file  Lifestyle  . Physical activity:    Days per week: Not on file    Minutes per session: Not on file  . Stress: Not on file  Relationships  . Social connections:    Talks on phone: Not on file    Gets together: Not on file    Attends religious service: Not on file    Active member of club or organization: Not on file    Attends meetings of clubs or organizations: Not on file    Relationship status: Not on file  . Intimate partner violence:    Fear of current or ex partner: Not on file    Emotionally abused: Not on file    Physically abused: Not on file    Forced sexual activity: Not on file  Other Topics Concern  . Not on file  Social History Narrative   Lives with daughter in a 2 story home.  Has 3 children.  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  Education: college.      Review of Systems: General: negative for chills, fever, night sweats or weight changes.  Cardiovascular: negative for chest pain, dyspnea on exertion, edema, orthopnea, palpitations, paroxysmal nocturnal dyspnea or shortness of breath Dermatological: negative for rash Respiratory: negative for cough or wheezing Urologic: negative for hematuria Abdominal: negative for nausea, vomiting, diarrhea, bright  red blood per rectum, melena, or hematemesis Neurologic: negative for visual changes, syncope, or dizziness All other systems reviewed and are otherwise negative except as noted above.    Blood pressure (!) 162/72, pulse 91, height 5\' 5"  (1.651 m), weight 168 lb 3.2 oz (76.3 kg).  General appearance: alert and no distress Neck: no adenopathy, no JVD, supple, symmetrical, trachea midline, thyroid not enlarged, symmetric, no tenderness/mass/nodules and Soft bilateral carotid bruits Lungs: clear to auscultation bilaterally Heart: Soft outflow tract murmur Extremities: extremities normal, atraumatic, no cyanosis or edema Pulses: 2+ and symmetric Skin: Skin color, texture, turgor normal. No rashes or lesions Neurologic: Alert and oriented X 3, normal strength and tone. Normal symmetric reflexes. Normal coordination and gait  EKG not performed today  ASSESSMENT AND PLAN:   CAD S/P mLAD PCI with DES History of CAD status post diagnostic coronary angiography performed by myself 08/05/2016 revealing a 80% mid LAD lesion which were stented with a 2.5 mm x 16 mm long Synergy drug-eluting stent postdilated to 2.8 mm.  She did have moderate RCA disease which was treated medically.  She had a Myoview stress test performed March 2019 which was nonischemic.  She denies chest pain.  Essential hypertension, benign History of essential hypertension her blood pressure measured today at 162/72.  She is on metoprolol 25 mg alternating with 50 mg.  She has moderately elevated heart rate.  I am going to increase her metoprolol to 50 mg p.o. twice daily.  Carotid bruit present History of carotid artery disease with Dopplers performed 07/13/2017 revealing moderate left ICA stenosis.  This will be repeated on an annual basis.  Dyslipidemia History of dyslipidemia on Repatha  Acute on chronic diastolic CHF (congestive heart failure) (HCC) History of diastolic heart failure with echo performed 06/19/2017 revealing  normal LV systolic function with grade 2 diastolic dysfunction.  She is on peritoneal dialysis for chronic renal insufficiency.      [name] MD FACP,FACC,FAHA, [facility] 03/14/2018 2:38 PM

## 2018-03-14 NOTE — Assessment & Plan Note (Signed)
History of dyslipidemia on Repatha

## 2018-03-14 NOTE — Assessment & Plan Note (Signed)
History of diastolic heart failure with echo performed 06/19/2017 revealing normal LV systolic function with grade 2 diastolic dysfunction.  She is on peritoneal dialysis for chronic renal insufficiency.

## 2018-03-14 NOTE — Assessment & Plan Note (Signed)
History of essential hypertension her blood pressure measured today at 162/72.  She is on metoprolol 25 mg alternating with 50 mg.  She has moderately elevated heart rate.  I am going to increase her metoprolol to 50 mg p.o. twice daily.

## 2018-03-14 NOTE — Assessment & Plan Note (Signed)
History of carotid artery disease with Dopplers performed 07/13/2017 revealing moderate left ICA stenosis.  This will be repeated on an annual basis.

## 2018-03-14 NOTE — Patient Instructions (Addendum)
Medication Instructions:  Your physician has recommended you make the following change in your medication:  RESUME TAKING METOPROLOL SUCCINATE (TOPROL-XL) 50 MG BY MOUTH DAILY If you need a refill on your cardiac medications before your next appointment, please call your pharmacy.   Lab work: NONE If you have labs (blood work) drawn today and your tests are completely normal, you will receive your results only by: [name] MyChart Message (if you have MyChart) OR . A paper copy in the mail If you have any lab test that is abnormal or we need to change your treatment, we will call you to review the results.  Testing/Procedures: Your physician has requested that you have a carotid duplex. This test is an ultrasound of the carotid arteries in your neck. It looks at blood flow through these arteries that supply the brain with blood. Allow one hour for this exam. There are no restrictions or special instructions.  SCHEDULE July 2020  Follow-Up: At [facility], you and your health needs are our priority.  As part of our continuing mission to provide you with exceptional heart care, we have created designated Provider Care Teams.  These Care Teams include your primary Cardiologist (physician) and Advanced Practice Providers (APPs -  Physician Assistants and Nurse Practitioners) who all work together to provide you with the care you need, when you need it. . You will need a follow up appointment in 6 months WITH AN APP AND IN 12 months WITH DR. Gwenlyn Found.  Please call our office 2 months in advance to schedule this appointment.  You may see Dr. Gwenlyn Found or one of the following Advanced Practice Providers on your designated Care Team:   . Kerin Ransom, [place] . Almyra Deforest, PA-C . Fabian Sharp, PA-C . Jory Sims, DNP . Rosaria Ferries, PA-C . Roby Lofts, PA-C . Sande Rives, PA-C

## 2018-03-14 NOTE — Assessment & Plan Note (Signed)
History of CAD status post diagnostic coronary angiography performed by myself 08/05/2016 revealing a 80% mid LAD lesion which were stented with a 2.5 mm x 16 mm long Synergy drug-eluting stent postdilated to 2.8 mm.  She did have moderate RCA disease which was treated medically.  She had a Myoview stress test performed March 2019 which was nonischemic.  She denies chest pain.

## 2018-03-14 NOTE — Telephone Encounter (Signed)
Called left a detailed msg stating that the reason why the pharmacy couldn't fill the repatha is because they didn't have their most recent drug insurance on file and that I provided the pharmacy with that information and they will have the repatha sureclick ready tomorrow for $8.95 and that the price is actually really good compared to other prices that I have seen lately.

## 2018-03-15 DIAGNOSIS — Z79899 Other long term (current) drug therapy: Secondary | ICD-10-CM | POA: Diagnosis not present

## 2018-03-15 DIAGNOSIS — E1129 Type 2 diabetes mellitus with other diabetic kidney complication: Secondary | ICD-10-CM | POA: Diagnosis not present

## 2018-03-15 DIAGNOSIS — N186 End stage renal disease: Secondary | ICD-10-CM | POA: Diagnosis not present

## 2018-03-15 DIAGNOSIS — D631 Anemia in chronic kidney disease: Secondary | ICD-10-CM | POA: Diagnosis not present

## 2018-03-15 DIAGNOSIS — D509 Iron deficiency anemia, unspecified: Secondary | ICD-10-CM | POA: Diagnosis not present

## 2018-03-15 MED FILL — ISOSORBIDE MN ER 30 MG TAB: 30 | 30 days supply | Qty: 15 | Fill #4

## 2018-03-16 DIAGNOSIS — D509 Iron deficiency anemia, unspecified: Secondary | ICD-10-CM | POA: Diagnosis not present

## 2018-03-16 DIAGNOSIS — E1129 Type 2 diabetes mellitus with other diabetic kidney complication: Secondary | ICD-10-CM | POA: Diagnosis not present

## 2018-03-16 DIAGNOSIS — Z79899 Other long term (current) drug therapy: Secondary | ICD-10-CM | POA: Diagnosis not present

## 2018-03-16 DIAGNOSIS — D631 Anemia in chronic kidney disease: Secondary | ICD-10-CM | POA: Diagnosis not present

## 2018-03-16 DIAGNOSIS — N186 End stage renal disease: Secondary | ICD-10-CM | POA: Diagnosis not present

## 2018-03-17 DIAGNOSIS — D509 Iron deficiency anemia, unspecified: Secondary | ICD-10-CM | POA: Diagnosis not present

## 2018-03-17 DIAGNOSIS — E1129 Type 2 diabetes mellitus with other diabetic kidney complication: Secondary | ICD-10-CM | POA: Diagnosis not present

## 2018-03-17 DIAGNOSIS — D631 Anemia in chronic kidney disease: Secondary | ICD-10-CM | POA: Diagnosis not present

## 2018-03-17 DIAGNOSIS — N186 End stage renal disease: Secondary | ICD-10-CM | POA: Diagnosis not present

## 2018-03-17 DIAGNOSIS — Z79899 Other long term (current) drug therapy: Secondary | ICD-10-CM | POA: Diagnosis not present

## 2018-03-17 MED FILL — METOCLOPRAMIDE 5 MG TABLET: 5 | 30 days supply | Qty: 120 | Fill #0

## 2018-03-17 MED FILL — PANTOPRAZOLE SOD DR 40 MG T: 40 | 30 days supply | Qty: 30 | Fill #1

## 2018-03-18 DIAGNOSIS — E1129 Type 2 diabetes mellitus with other diabetic kidney complication: Secondary | ICD-10-CM | POA: Diagnosis not present

## 2018-03-18 DIAGNOSIS — Z79899 Other long term (current) drug therapy: Secondary | ICD-10-CM | POA: Diagnosis not present

## 2018-03-18 DIAGNOSIS — D631 Anemia in chronic kidney disease: Secondary | ICD-10-CM | POA: Diagnosis not present

## 2018-03-18 DIAGNOSIS — D509 Iron deficiency anemia, unspecified: Secondary | ICD-10-CM | POA: Diagnosis not present

## 2018-03-18 DIAGNOSIS — N186 End stage renal disease: Secondary | ICD-10-CM | POA: Diagnosis not present

## 2018-03-19 DIAGNOSIS — D631 Anemia in chronic kidney disease: Secondary | ICD-10-CM | POA: Diagnosis not present

## 2018-03-19 DIAGNOSIS — Z79899 Other long term (current) drug therapy: Secondary | ICD-10-CM | POA: Diagnosis not present

## 2018-03-19 DIAGNOSIS — E1129 Type 2 diabetes mellitus with other diabetic kidney complication: Secondary | ICD-10-CM | POA: Diagnosis not present

## 2018-03-19 DIAGNOSIS — N186 End stage renal disease: Secondary | ICD-10-CM | POA: Diagnosis not present

## 2018-03-19 DIAGNOSIS — D509 Iron deficiency anemia, unspecified: Secondary | ICD-10-CM | POA: Diagnosis not present

## 2018-03-20 DIAGNOSIS — Z79899 Other long term (current) drug therapy: Secondary | ICD-10-CM | POA: Diagnosis not present

## 2018-03-20 DIAGNOSIS — N186 End stage renal disease: Secondary | ICD-10-CM | POA: Diagnosis not present

## 2018-03-20 DIAGNOSIS — D509 Iron deficiency anemia, unspecified: Secondary | ICD-10-CM | POA: Diagnosis not present

## 2018-03-20 DIAGNOSIS — E1129 Type 2 diabetes mellitus with other diabetic kidney complication: Secondary | ICD-10-CM | POA: Diagnosis not present

## 2018-03-20 DIAGNOSIS — D631 Anemia in chronic kidney disease: Secondary | ICD-10-CM | POA: Diagnosis not present

## 2018-03-20 MED FILL — hydrOXYzine HCL 25 MG TABS: 25 | 12 days supply | Qty: 50 | Fill #0

## 2018-03-21 DIAGNOSIS — N186 End stage renal disease: Secondary | ICD-10-CM | POA: Diagnosis not present

## 2018-03-21 DIAGNOSIS — Z79899 Other long term (current) drug therapy: Secondary | ICD-10-CM | POA: Diagnosis not present

## 2018-03-21 DIAGNOSIS — D509 Iron deficiency anemia, unspecified: Secondary | ICD-10-CM | POA: Diagnosis not present

## 2018-03-21 DIAGNOSIS — D631 Anemia in chronic kidney disease: Secondary | ICD-10-CM | POA: Diagnosis not present

## 2018-03-21 DIAGNOSIS — E1129 Type 2 diabetes mellitus with other diabetic kidney complication: Secondary | ICD-10-CM | POA: Diagnosis not present

## 2018-03-22 ENCOUNTER — Ambulatory Visit: Payer: Medicare Other | Attending: Family Medicine | Admitting: Family Medicine

## 2018-03-22 ENCOUNTER — Encounter: Payer: Self-pay | Admitting: Family Medicine

## 2018-03-22 ENCOUNTER — Other Ambulatory Visit: Payer: Self-pay

## 2018-03-22 VITALS — BP 137/95 | HR 59 | Temp 98.4°F | Ht 65.0 in | Wt 173.6 lb

## 2018-03-22 DIAGNOSIS — D649 Anemia, unspecified: Secondary | ICD-10-CM | POA: Diagnosis not present

## 2018-03-22 DIAGNOSIS — Z79899 Other long term (current) drug therapy: Secondary | ICD-10-CM | POA: Diagnosis not present

## 2018-03-22 DIAGNOSIS — N186 End stage renal disease: Secondary | ICD-10-CM

## 2018-03-22 DIAGNOSIS — Z992 Dependence on renal dialysis: Secondary | ICD-10-CM | POA: Diagnosis not present

## 2018-03-22 DIAGNOSIS — J4540 Moderate persistent asthma, uncomplicated: Secondary | ICD-10-CM | POA: Insufficient documentation

## 2018-03-22 DIAGNOSIS — Z888 Allergy status to other drugs, medicaments and biological substances status: Secondary | ICD-10-CM | POA: Insufficient documentation

## 2018-03-22 DIAGNOSIS — IMO0002 Reserved for concepts with insufficient information to code with codable children: Secondary | ICD-10-CM

## 2018-03-22 DIAGNOSIS — E1143 Type 2 diabetes mellitus with diabetic autonomic (poly)neuropathy: Secondary | ICD-10-CM | POA: Diagnosis not present

## 2018-03-22 DIAGNOSIS — D509 Iron deficiency anemia, unspecified: Secondary | ICD-10-CM | POA: Diagnosis not present

## 2018-03-22 DIAGNOSIS — E1165 Type 2 diabetes mellitus with hyperglycemia: Secondary | ICD-10-CM

## 2018-03-22 DIAGNOSIS — I2510 Atherosclerotic heart disease of native coronary artery without angina pectoris: Secondary | ICD-10-CM | POA: Diagnosis not present

## 2018-03-22 DIAGNOSIS — Z9861 Coronary angioplasty status: Secondary | ICD-10-CM

## 2018-03-22 DIAGNOSIS — Z880 Allergy status to penicillin: Secondary | ICD-10-CM | POA: Insufficient documentation

## 2018-03-22 DIAGNOSIS — K219 Gastro-esophageal reflux disease without esophagitis: Secondary | ICD-10-CM | POA: Insufficient documentation

## 2018-03-22 DIAGNOSIS — Z86718 Personal history of other venous thrombosis and embolism: Secondary | ICD-10-CM | POA: Insufficient documentation

## 2018-03-22 DIAGNOSIS — L299 Pruritus, unspecified: Secondary | ICD-10-CM | POA: Diagnosis not present

## 2018-03-22 DIAGNOSIS — E1142 Type 2 diabetes mellitus with diabetic polyneuropathy: Secondary | ICD-10-CM | POA: Diagnosis not present

## 2018-03-22 DIAGNOSIS — E1122 Type 2 diabetes mellitus with diabetic chronic kidney disease: Secondary | ICD-10-CM | POA: Diagnosis not present

## 2018-03-22 DIAGNOSIS — E1151 Type 2 diabetes mellitus with diabetic peripheral angiopathy without gangrene: Secondary | ICD-10-CM | POA: Insufficient documentation

## 2018-03-22 DIAGNOSIS — I120 Hypertensive chronic kidney disease with stage 5 chronic kidney disease or end stage renal disease: Secondary | ICD-10-CM | POA: Insufficient documentation

## 2018-03-22 DIAGNOSIS — Z7982 Long term (current) use of aspirin: Secondary | ICD-10-CM | POA: Diagnosis not present

## 2018-03-22 DIAGNOSIS — Z8249 Family history of ischemic heart disease and other diseases of the circulatory system: Secondary | ICD-10-CM | POA: Diagnosis not present

## 2018-03-22 DIAGNOSIS — Z0000 Encounter for general adult medical examination without abnormal findings: Secondary | ICD-10-CM

## 2018-03-22 DIAGNOSIS — L298 Other pruritus: Secondary | ICD-10-CM | POA: Diagnosis not present

## 2018-03-22 DIAGNOSIS — Z955 Presence of coronary angioplasty implant and graft: Secondary | ICD-10-CM | POA: Diagnosis not present

## 2018-03-22 DIAGNOSIS — D631 Anemia in chronic kidney disease: Secondary | ICD-10-CM | POA: Diagnosis not present

## 2018-03-22 DIAGNOSIS — Z794 Long term (current) use of insulin: Secondary | ICD-10-CM | POA: Insufficient documentation

## 2018-03-22 DIAGNOSIS — K3184 Gastroparesis: Secondary | ICD-10-CM | POA: Diagnosis not present

## 2018-03-22 DIAGNOSIS — E1129 Type 2 diabetes mellitus with other diabetic kidney complication: Secondary | ICD-10-CM | POA: Diagnosis not present

## 2018-03-22 LAB — GLUCOSE, POCT (MANUAL RESULT ENTRY): POC GLUCOSE: 362 mg/dL — AB (ref 70–99)

## 2018-03-22 LAB — POCT GLYCOSYLATED HEMOGLOBIN (HGB A1C): HbA1c, POC (controlled diabetic range): 9.8 % — AB (ref 0.0–7.0)

## 2018-03-22 NOTE — Progress Notes (Signed)
Subjective:  Patient ID: Susan Horton, female    DOB: 06/25/1970  Age: 49 y.o. MRN: [id number]  CC: Diabetes   HPI Susan Horton is a 48 year old female with a history of Type 2 Diabetes Mellitus (A1c 9.8), Hypertension, CAD, ESRD on hemodialysis, Asthma here for a follow up visit. She complains of generalized pruritus which worsened this week.  She previously had pruritus in the past but not as severe and has tried Benadryl, hydroxyzine, hydrocortisone, Sarna lotion with no improvement in symptoms. Of note her cardiologist had commenced Repatha last week.  With regards to her diabetes mellitus she saw endocrine last in 09/2017 and has an upcoming appointment next week.  Endorses compliance with her medications. Hemodialysis sessions are going well and she endorses compliance with her antihypertensives.  Past Medical History:  Diagnosis Date  . Anemia   . Asthma   . CAD (coronary artery disease)    DES to mid LAD July 2018, residual moderate RCA disease  . Cataract   . CKD (chronic kidney disease) stage 4, GFR 15-29 ml/min (HCC)    Dialysis T/Th/Sa  . DVT (deep venous thrombosis) ([place])    1996 during pregnancy, 2015 left leg  . Essential hypertension 12/11/2015  . Gastroparesis   . GERD (gastroesophageal reflux disease)   . Hidradenitis   . Migraine   . Neuropathy   . Peripheral vascular disease ([place])    blood clot in leg  . Type 2 diabetes mellitus ([place])   . Type II diabetes mellitus ([place])     Past Surgical History:  Procedure Laterality Date  . A/V FISTULAGRAM N/A 06/22/2017   Procedure: A/V FISTULAGRAM - left arm;  Surgeon: [name], MD;  Location: [place] [facility];  Service: Cardiovascular;  Laterality: N/A;  . ABDOMINOPLASTY    . APPENDECTOMY  1995  . AV FISTULA PLACEMENT Left 02/01/2017   Procedure: ARTERIOVENOUS BRACHIOCEPHALIC (AV) FISTULA CREATION;  Surgeon: [name], MD;  Location: [place];  Service: Vascular;  Laterality: Left;  .  CHOLECYSTECTOMY  1993  . CORONARY ANGIOPLASTY WITH STENT PLACEMENT    . CORONARY STENT INTERVENTION N/A 08/05/2016   Procedure: Coronary Stent Intervention;  Surgeon: [name], MD;  Location: [facility];  Service: Cardiovascular;  Laterality: N/A;  . EXPLORATORY LAPAROTOMY  08/14/2005   lysis of adhesions, drainage of tubo-ovarian abscess  . FISTULA SUPERFICIALIZATION Left 09/14/2017   Procedure: FISTULA SUPERFICIALIZATION ARTERIOVENOUS FISTULA LEFT ARM;  Surgeon: [name], MD;  Location: [place];  Service: Vascular;  Laterality: Left;  . HYDRADENITIS EXCISION  02/08/2011   Procedure: EXCISION HYDRADENITIS GROIN;  Surgeon: [name], MD;  Location: [place] [facility];  Service: General;  Laterality: N/A;  Excisioin of Hidradenitis Left groin  . INGUINAL HIDRADENITIS EXCISION  07/06/2010   bilateral  . INSERTION OF DIALYSIS CATHETER N/A 09/14/2017   Procedure: INSERTION OF TUNNELED DIALYSIS CATHETER Right Internal Jugular;  Surgeon: [name], MD;  Location: [facility];  Service: Vascular;  Laterality: N/A;  . LEFT HEART CATH AND CORONARY ANGIOGRAPHY N/A 08/05/2016   Procedure: Left Heart Cath and Coronary Angiography;  Surgeon: [name], MD;  Location: [facility];  Service: Cardiovascular;  Laterality: N/A;  . REDUCTION MAMMAPLASTY  2002  . TONSILLECTOMY    . TUBAL LIGATION  1996  . VAGINAL HYSTERECTOMY  08/04/2005   and cysto    Family History  Problem Relation Age of Onset  . Hypertension  Father   . Hypertension Sister   . Hypertension Brother   . Breast cancer Cousin        x2    Allergies  Allergen Reactions  . Penicillins Anaphylaxis, Itching, Swelling, Rash and Other (See Comments)    Swelling of throat & whole mouth  Has patient had a PCN reaction causing immediate rash, facial/tongue/throat swelling, SOB or lightheadedness with hypotension: Yes Has patient had a PCN reaction causing severe rash involving mucus  membranes or skin necrosis: No Has patient had a PCN reaction that required hospitalization: No Has patient had a PCN reaction occurring within the last 10 years: No If all of the above answers are "NO", then may proceed with Cephalosporin use.  [name] Lisinopril Cough    Outpatient Medications Prior to Visit  Medication Sig Dispense Refill  . albuterol (PROVENTIL HFA;VENTOLIN HFA) 108 (90 BASE) MCG/ACT inhaler Inhale 1-2 puffs into the lungs every 6 (six) hours as needed for wheezing or shortness of breath.     [name] aspirin 81 MG chewable tablet Chew 1 tablet (81 mg total) by mouth daily. 30 tablet 0  . calcium acetate (PHOSLO) 667 MG capsule TAKE 1 CAPSULE BY MOUTH THREE TIMES DAILY WITH MEALS AND 1 CAPSULE TWO TIMES DAILY WITH SNACKS  10  . capsicum (ZOSTRIX) 0.075 % topical cream Apply 1 application topically 2 (two) times daily. 28.3 g 0  . clopidogrel (PLAVIX) 75 MG tablet Take 1 tablet (75 mg total) by mouth daily with breakfast. 90 tablet 3  . Evolocumab (REPATHA SURECLICK) 086 MG/ML SOAJ Inject 140 mg into the skin every 14 (fourteen) days. 2 pen 11  . Ferrous Sulfate (IRON) 325 (65 Fe) MG TABS Take 325 mg by mouth daily. 30 each 3  . fluconazole (DIFLUCAN) 150 MG tablet Take 1 tablet (150 mg total) by mouth daily. 2 tablet 0  . Fluticasone-Salmeterol (ADVAIR) 250-50 MCG/DOSE AEPB Inhale 1 puff into the lungs 2 (two) times daily. (Patient taking differently: Inhale 1 puff into the lungs daily as needed (shortness of breath). ) 60 each 3  . Insulin Glargine (TOUJEO SOLOSTAR) 300 UNIT/ML SOPN Inject 25 Units into the skin 2 (two) times daily. 45 mL 3  . insulin regular (HUMULIN R) 100 units/mL injection Inject 0.2-0.25 mLs (20-25 Units total) into the skin See admin instructions. 45 mL 3  . lidocaine (XYLOCAINE) 5 % ointment Apply 1 application topically as needed. 35.44 g 0  . lidocaine-prilocaine (EMLA) cream APPLY SMALL AMOUNT TO ACCESS SITE (AVF) 30 MINUTES BEFORE DIALYSIS. COVER WITH  OCCLUSIVE DRESSING (SARAN WRAP) THREE DAYS A WEEK (3 TIMES A  5  . lovastatin (MEVACOR) 20 MG tablet Take 20 mg by mouth at bedtime.     . methocarbamol (ROBAXIN) 500 MG tablet Take 1 tablet (500 mg total) by mouth 2 (two) times daily as needed for muscle spasms. 60 tablet 1  . metoCLOPramide (REGLAN) 5 MG tablet TAKE 1 TABLET BY MOUTH 4 TIMES DAILY BEFORE MEALS AND AT BEDTIME. (Patient taking differently: Take 5 mg by mouth 4 (four) times daily -  before meals and at bedtime. ) 90 tablet 0  . metoprolol succinate (TOPROL-XL) 50 MG 24 hr tablet Take 1 tablet (50 mg total) by mouth daily. 90 tablet 3  . montelukast (SINGULAIR) 10 MG tablet Take 10 mg by mouth daily as needed (allergies).     . multivitamin (RENA-VIT) TABS tablet Take 1 tablet by mouth at bedtime.  0  . nitroGLYCERIN (NITROSTAT) 0.4 MG SL tablet  Place 1 tablet (0.4 mg total) under the tongue every 5 (five) minutes as needed for chest pain. 25 tablet 2  . pantoprazole (PROTONIX) 40 MG tablet TAKE 1 TABLET BY MOUTH DAILY. 30 tablet 7  . pregabalin (LYRICA) 75 MG capsule Take 1 capsule (75 mg total) by mouth 2 (two) times daily. 30 capsule 1  . senna-docusate (SENOKOT-S) 8.6-50 MG tablet Take 1 tablet by mouth 2 (two) times daily. (Patient taking differently: Take 1 tablet by mouth daily as needed for moderate constipation. )    . trimethoprim-polymyxin b (POLYTRIM) ophthalmic solution INSTILL ONE DROP INTO BOTH EYES 4 TIMES A DAY FOR 2 DAYS AFTER EACH MONTHLY EYE INJECTION  12  . isosorbide mononitrate (IMDUR) 30 MG 24 hr tablet Take 0.5 tablets (15 mg total) by mouth daily. 45 tablet 3   No facility-administered medications prior to visit.      ROS Review of Systems General: negative for fever, weight loss, appetite change Eyes: no visual symptoms. ENT: no ear symptoms, no sinus tenderness, no nasal congestion or sore throat. Neck: no pain  Respiratory: no wheezing, shortness of breath, cough Cardiovascular: no chest pain, no  dyspnea on exertion, no pedal edema, no orthopnea. Gastrointestinal: no abdominal pain, no diarrhea, no constipation Genito-Urinary: no urinary frequency, no dysuria, no polyuria. Hematologic: no bruising Endocrine: no cold or heat intolerance Neurological: no headaches, no seizures, no tremors Musculoskeletal: no joint pains, no joint swelling Skin: + pruritus, no rash. Psychological: no depression, no anxiety,    Objective:  BP (!) 137/95   Pulse (!) 59   Temp 98.4 F (36.9 C) (Oral)   Ht 5\' 5"  (1.651 m)   Wt 173 lb 9.6 oz (78.7 kg)   SpO2 100%   BMI 28.89 kg/m   BP/Weight 03/22/2018 7/0/0174 09/14/4965  Systolic BP 591 638 466  Diastolic BP 95 72 81  Wt. (Lbs) 173.6 168.2 165  BMI 28.89 27.99 27.46      Physical Exam Constitutional: normal appearing,  Eyes: PERRLA HEENT: Head is atraumatic, normal sinuses, normal oropharynx, normal appearing tonsils and palate, tympanic membrane is normal bilaterally. Neck: normal range of motion, no thyromegaly, no JVD Cardiovascular: normal rate and rhythm, normal heart sounds, no murmurs, rub or gallop, no pedal edema Respiratory: Normal breath sounds, clear to auscultation bilaterally, no wheezes, no rales, no rhonchi Abdomen: soft, not tender to palpation, normal bowel sounds, no enlarged organs Musculoskeletal: Full ROM, no tenderness in joints, left arm AV fistula with palpable thrill Skin: warm and dry, no lesions. Neurological: alert, oriented x3, cranial nerves I-XII grossly intact , normal motor strength, normal sensation. Psychological: normal mood.   CMP Latest Ref Rng & Units 09/14/2017 07/21/2017 06/23/2017  Glucose 70 - 99 mg/dL 153(H) - 228(H)  BUN 6 - 20 mg/dL - - 104(H)  Creatinine 0.44 - 1.00 mg/dL - - 6.76(H)  Sodium 135 - 145 mmol/L 141 - 137  Potassium 3.5 - 5.1 mmol/L 4.1 - 5.6(H)  Chloride 101 - 111 mmol/L - - 101  CO2 22 - 32 mmol/L - - 22  Calcium 8.9 - 10.3 mg/dL - - 7.9(L)  Total Protein 6.0 - 8.5 g/dL -  6.7 -  Total Bilirubin 0.0 - 1.2 mg/dL - <0.2 -  Alkaline Phos 39 - 117 IU/L - 114 -  AST 0 - 40 IU/L - 8 -  ALT 0 - 32 IU/L - 7 -    Lipid Panel     Component Value Date/Time   CHOL 248 (  H) 07/21/2017 0959   TRIG 248 (H) 07/21/2017 0959   HDL 33 (L) 07/21/2017 0959   CHOLHDL 7.5 (H) 07/21/2017 0959   CHOLHDL 7.2 Ratio 01/01/2010 2055   VLDL 30 01/01/2010 2055   LDLCALC 165 (H) 07/21/2017 0959    CBC    Component Value Date/Time   WBC 9.5 06/18/2017 1443   RBC 4.95 06/18/2017 1443   HGB 13.6 09/14/2017 1051   HGB 9.2 (L) 07/26/2016 0000   HCT 40.0 09/14/2017 1051   HCT 30.7 (L) 07/26/2016 0000   PLT 356 06/18/2017 1443   PLT 474 (H) 07/26/2016 0000   MCV 82.6 06/18/2017 1443   MCV 82 07/26/2016 0000   MCH 24.6 (L) 06/18/2017 1443   MCHC 29.8 (L) 06/18/2017 1443   RDW 14.4 06/18/2017 1443   RDW 13.6 07/26/2016 0000   LYMPHSABS 2.9 07/26/2016 0000   MONOABS 0.4 11/20/2015 1843   EOSABS 0.1 07/26/2016 0000   BASOSABS 0.0 07/26/2016 0000    Lab Results  Component Value Date   HGBA1C 9.8 (A) 03/22/2018    Assessment & Plan:   1. Uncontrolled type 2 diabetes mellitus with peripheral neuropathy ([place]) Uncontrolled with A1c of 9.8 She will be seeing her endocrinologist next week for medication adjustment - POCT glucose (manual entry) - POCT glycosylated hemoglobin (Hb A1C)  2. Pruritus Possibly an allergic reaction to Repatha She will be getting in touch with her cardiologist regarding this as symptoms are uncontrolled on hydroxyzine, hydrocortisone, Benadryl  3. Moderate persistent asthma without complication Stable No exacerbations  4. CAD S/P mLAD PCI with DES No angina Risk factor modification  5. ESRD on dialysis [facility]) Continue hemodialysis as per schedule   6. Healthcare maintenance She is due for mammogram and will be calling dissented to set up an appointment   No orders of the defined types were placed in this encounter.   Follow-up: Return  in about 6 months (around 09/22/2018) for follow up of chronic medical conditions.       Charlott Rakes, MD, FAAFP. [facility], [place]   03/22/2018, 4:46 PM

## 2018-03-22 NOTE — Progress Notes (Signed)
Patient is itching all over body.

## 2018-03-23 ENCOUNTER — Encounter (HOSPITAL_COMMUNITY): Payer: Self-pay

## 2018-03-23 ENCOUNTER — Emergency Department (HOSPITAL_COMMUNITY)
Admission: EM | Admit: 2018-03-23 | Discharge: 2018-03-24 | Disposition: A | Discharge status: Home / Self Care | Payer: Medicare Other | Attending: Emergency Medicine | Admitting: Emergency Medicine

## 2018-03-23 ENCOUNTER — Other Ambulatory Visit: Payer: Self-pay

## 2018-03-23 ENCOUNTER — Telehealth: Payer: Self-pay | Admitting: Cardiovascular Disease

## 2018-03-23 DIAGNOSIS — L299 Pruritus, unspecified: Secondary | ICD-10-CM | POA: Diagnosis not present

## 2018-03-23 DIAGNOSIS — D631 Anemia in chronic kidney disease: Secondary | ICD-10-CM | POA: Diagnosis not present

## 2018-03-23 DIAGNOSIS — E1129 Type 2 diabetes mellitus with other diabetic kidney complication: Secondary | ICD-10-CM | POA: Diagnosis not present

## 2018-03-23 DIAGNOSIS — N184 Chronic kidney disease, stage 4 (severe): Secondary | ICD-10-CM | POA: Diagnosis not present

## 2018-03-23 DIAGNOSIS — Z7982 Long term (current) use of aspirin: Secondary | ICD-10-CM | POA: Diagnosis not present

## 2018-03-23 DIAGNOSIS — E119 Type 2 diabetes mellitus without complications: Secondary | ICD-10-CM | POA: Diagnosis not present

## 2018-03-23 DIAGNOSIS — Z79899 Other long term (current) drug therapy: Secondary | ICD-10-CM | POA: Diagnosis not present

## 2018-03-23 DIAGNOSIS — N186 End stage renal disease: Secondary | ICD-10-CM | POA: Diagnosis not present

## 2018-03-23 DIAGNOSIS — I129 Hypertensive chronic kidney disease with stage 1 through stage 4 chronic kidney disease, or unspecified chronic kidney disease: Secondary | ICD-10-CM | POA: Diagnosis not present

## 2018-03-23 DIAGNOSIS — D509 Iron deficiency anemia, unspecified: Secondary | ICD-10-CM | POA: Diagnosis not present

## 2018-03-23 NOTE — Telephone Encounter (Signed)
Spoke with patient, has been itching > 1 week from scalp to toes.  Using topical products (sarna, oatmeal bath) as well as hydroxyzine, but itching continues.  Last dose of Repatha was on March 3 (9 days ago).  Advised patient that if it was the Repatha, she should have it completely out of her system soon.  If she continues to itch more than 2 weeks past the dose, it is probably another cause.    Will reach out to her in 7-10 days and see how she is doing.  May consider Prauent 75 mg trial if itching has completely resolved.   Patient voiced understanding.

## 2018-03-23 NOTE — Telephone Encounter (Signed)
°  Pt c/o medication issue:  1. Name of Medication: Repatha  2. How are you currently taking this medication (dosage and times per day)? n/a  3. Are you having a reaction (difficulty breathing--STAT)? Itching all over  4. What is your medication issue? Itching all over

## 2018-03-23 NOTE — ED Triage Notes (Signed)
Pt states she started a new medication a week ago and has been itching since.  PCP advised lotions and pt states they have not helped.  Itching is all over, no shortness of breath or hives visualized.

## 2018-03-24 ENCOUNTER — Encounter (HOSPITAL_COMMUNITY): Payer: Self-pay | Admitting: Emergency Medicine

## 2018-03-24 DIAGNOSIS — D631 Anemia in chronic kidney disease: Secondary | ICD-10-CM | POA: Diagnosis not present

## 2018-03-24 DIAGNOSIS — L299 Pruritus, unspecified: Secondary | ICD-10-CM | POA: Diagnosis not present

## 2018-03-24 DIAGNOSIS — Z79899 Other long term (current) drug therapy: Secondary | ICD-10-CM | POA: Diagnosis not present

## 2018-03-24 DIAGNOSIS — N186 End stage renal disease: Secondary | ICD-10-CM | POA: Diagnosis not present

## 2018-03-24 DIAGNOSIS — E1129 Type 2 diabetes mellitus with other diabetic kidney complication: Secondary | ICD-10-CM | POA: Diagnosis not present

## 2018-03-24 DIAGNOSIS — D509 Iron deficiency anemia, unspecified: Secondary | ICD-10-CM | POA: Diagnosis not present

## 2018-03-24 MED ORDER — FAMOTIDINE 20 MG PO TABS
20.0000 mg | ORAL_TABLET | Freq: Once | ORAL | Status: AC
  Administered 2018-03-24: 20 mg via ORAL
  Filled 2018-03-24: qty 1

## 2018-03-24 MED ORDER — PREDNISONE 20 MG PO TABS: ORAL_TABLET | ORAL | 0 refills | Status: DC

## 2018-03-24 MED ORDER — PREDNISONE 20 MG PO TABS
60.0000 mg | ORAL_TABLET | Freq: Once | ORAL | Status: AC
  Administered 2018-03-24: 60 mg via ORAL
  Filled 2018-03-24: qty 3

## 2018-03-24 NOTE — ED Provider Notes (Signed)
[facility] EMERGENCY DEPARTMENT Provider Note   CSN: [id number] Arrival date & time: 03/23/18  2222    History   Chief Complaint Chief Complaint  Patient presents with  . Allergic Reaction  . Pruritis    HPI Susan Horton is a 48 y.o. female.     The history is provided by the patient.  Allergic Reaction  Presenting symptoms: itching and rash   Severity:  Moderate Duration:  1 week Prior allergic episodes:  No prior episodes Context: medications   Context comment:  New cholesterol med Relieved by:  Nothing Worsened by:  Nothing Ineffective treatments:  Antihistamines placed on new injectable cholesterol medication and has had intermittent hives on the torso and constant itching since that time.  No swelling of the lips tongue or uvula.    Past Medical History:  Diagnosis Date  . Anemia   . Asthma   . CAD (coronary artery disease)    DES to mid LAD July 2018, residual moderate RCA disease  . Cataract   . CKD (chronic kidney disease) stage 4, GFR 15-29 ml/min (HCC)    Dialysis T/Th/Sa  . DVT (deep venous thrombosis) ([place])    1996 during pregnancy, 2015 left leg  . Essential hypertension 12/11/2015  . Gastroparesis   . GERD (gastroesophageal reflux disease)   . Hidradenitis   . Migraine   . Neuropathy   . Peripheral vascular disease ([place])    blood clot in leg  . Type 2 diabetes mellitus ([place])   . Type II diabetes mellitus [facility])     Patient Active Problem List   Diagnosis Date Noted  . Type 2 diabetes mellitus with complication, with long-term current use of insulin ([place]) 10/19/2017  . ESRD on dialysis ([place]) 10/11/2017  . Acute on chronic diastolic CHF (congestive heart failure) ([place]) 06/18/2017  . Elevated troponin 06/18/2017  . Hypertensive urgency 06/18/2017  . Moderate persistent asthma 06/10/2017  . Adjustment disorder with depressed mood 09/08/2016  . Chest pain 08/29/2016  . CKD (chronic kidney disease) stage 4, GFR 15-29 ml/min  (HCC)   . CAD S/P mLAD PCI with DES 08/06/2016  . Presence of drug coated stent in LAD coronary artery 08/06/2016  . Abnormal stress test   . Carotid bruit present 06/15/2016  . Dyslipidemia 06/15/2016  . Smoker 06/15/2016  . Neuropathy 03/10/2016  . Essential hypertension, benign 12/11/2015  . Gastroparesis   . Progressive angina ([place]) 06/05/2014  . Uncontrolled type 2 diabetes mellitus with peripheral neuropathy ([place]) 06/05/2014  . Hyperglycemia     Past Surgical History:  Procedure Laterality Date  . A/V FISTULAGRAM N/A 06/22/2017   Procedure: A/V FISTULAGRAM - left arm;  Surgeon: [name], MD;  Location: [facility] CV LAB;  Service: Cardiovascular;  Laterality: N/A;  . ABDOMINOPLASTY    . APPENDECTOMY  1995  . AV FISTULA PLACEMENT Left 02/01/2017   Procedure: ARTERIOVENOUS BRACHIOCEPHALIC (AV) FISTULA CREATION;  Surgeon: [name], MD;  Location: [facility];  Service: Vascular;  Laterality: Left;  . CHOLECYSTECTOMY  1993  . CORONARY ANGIOPLASTY WITH STENT PLACEMENT    . CORONARY STENT INTERVENTION N/A 08/05/2016   Procedure: Coronary Stent Intervention;  Surgeon: [name], MD;  Location: [facility] CV LAB;  Service: Cardiovascular;  Laterality: N/A;  . EXPLORATORY LAPAROTOMY  08/14/2005   lysis of adhesions, drainage of tubo-ovarian abscess  . FISTULA SUPERFICIALIZATION Left 09/14/2017   Procedure: FISTULA SUPERFICIALIZATION ARTERIOVENOUS FISTULA LEFT ARM;  Surgeon: [name], MD;  Location: [facility];  Service: Vascular;  Laterality: Left;  . HYDRADENITIS EXCISION  02/08/2011   Procedure: EXCISION HYDRADENITIS GROIN;  Surgeon: [name], MD;  Location: [facility];  Service: General;  Laterality: N/A;  Excisioin of Hidradenitis Left groin  . INGUINAL HIDRADENITIS EXCISION  07/06/2010   bilateral  . INSERTION OF DIALYSIS CATHETER N/A 09/14/2017   Procedure: INSERTION OF TUNNELED DIALYSIS CATHETER Right Internal Jugular;   Surgeon: [name], MD;  Location: [facility];  Service: Vascular;  Laterality: N/A;  . LEFT HEART CATH AND CORONARY ANGIOGRAPHY N/A 08/05/2016   Procedure: Left Heart Cath and Coronary Angiography;  Surgeon: [name], MD;  Location: [facility] CV LAB;  Service: Cardiovascular;  Laterality: N/A;  . REDUCTION MAMMAPLASTY  2002  . TONSILLECTOMY    . TUBAL LIGATION  1996  . VAGINAL HYSTERECTOMY  08/04/2005   and cysto     OB History   No obstetric history on file.      Home Medications    Prior to Admission medications   Medication Sig Start Date End Date Taking? Authorizing Provider  albuterol (PROVENTIL HFA;VENTOLIN HFA) 108 (90 BASE) MCG/ACT inhaler Inhale 1-2 puffs into the lungs every 6 (six) hours as needed for wheezing or shortness of breath.     [provider]  aspirin 81 MG chewable tablet Chew 1 tablet (81 mg total) by mouth daily. 08/07/16   Cheryln Manly, NP  calcium acetate (PHOSLO) 667 MG capsule TAKE 1 CAPSULE BY MOUTH THREE TIMES DAILY WITH MEALS AND 1 CAPSULE TWO TIMES DAILY WITH SNACKS 09/23/17   [provider]  capsicum (ZOSTRIX) 0.075 % topical cream Apply 1 application topically 2 (two) times daily. 09/07/17   Jamse Arn, MD  clopidogrel (PLAVIX) 75 MG tablet Take 1 tablet (75 mg total) by mouth daily with breakfast. 10/19/17   Kilroy, Doreene Burke, PA-C  Evolocumab (REPATHA SURECLICK) 468 MG/ML SOAJ Inject 140 mg into the skin every 14 (fourteen) days. 02/27/18   [name], MD  Ferrous Sulfate (IRON) 325 (65 Fe) MG TABS Take 325 mg by mouth daily. 11/25/15   Brayton Caves, PA-C  fluconazole (DIFLUCAN) 150 MG tablet Take 1 tablet (150 mg total) by mouth daily. 09/14/17   [name], MD  Fluticasone-Salmeterol (ADVAIR) 250-50 MCG/DOSE AEPB Inhale 1 puff into the lungs 2 (two) times daily. Patient taking differently: Inhale 1 puff into the lungs daily as needed (shortness of breath).  06/10/17   Charlott Rakes, MD   Insulin Glargine (TOUJEO SOLOSTAR) 300 UNIT/ML SOPN Inject 25 Units into the skin 2 (two) times daily. 09/22/17   Philemon Kingdom, MD  insulin regular (HUMULIN R) 100 units/mL injection Inject 0.2-0.25 mLs (20-25 Units total) into the skin See admin instructions. 09/22/17   Philemon Kingdom, MD  isosorbide mononitrate (IMDUR) 30 MG 24 hr tablet Take 0.5 tablets (15 mg total) by mouth daily. 10/19/17 01/17/18  Erlene Quan, PA-C  lidocaine (XYLOCAINE) 5 % ointment Apply 1 application topically as needed. 10/05/17   Jamse Arn, MD  lidocaine-prilocaine (EMLA) cream APPLY SMALL AMOUNT TO ACCESS SITE (AVF) 30 MINUTES BEFORE DIALYSIS. COVER WITH OCCLUSIVE DRESSING (SARAN WRAP) THREE DAYS A WEEK (3 TIMES A 09/15/17   [provider]  lovastatin (MEVACOR) 20 MG tablet Take 20 mg by mouth at bedtime.     [provider]  methocarbamol (ROBAXIN) 500 MG tablet Take 1 tablet (500 mg total) by mouth 2 (two) times daily  as needed for muscle spasms. 09/07/17   Jamse Arn, MD  metoCLOPramide (REGLAN) 5 MG tablet TAKE 1 TABLET BY MOUTH 4 TIMES DAILY BEFORE MEALS AND AT BEDTIME. Patient taking differently: Take 5 mg by mouth 4 (four) times daily -  before meals and at bedtime.  03/18/17   [name], MD  metoprolol succinate (TOPROL-XL) 50 MG 24 hr tablet Take 1 tablet (50 mg total) by mouth daily. 03/14/18   [name], MD  montelukast (SINGULAIR) 10 MG tablet Take 10 mg by mouth daily as needed (allergies).     [provider]  multivitamin (RENA-VIT) TABS tablet Take 1 tablet by mouth at bedtime. 06/23/17   Mikhail, Velta Addison, DO  nitroGLYCERIN (NITROSTAT) 0.4 MG SL tablet Place 1 tablet (0.4 mg total) under the tongue every 5 (five) minutes as needed for chest pain. 10/19/17   Erlene Quan, PA-C  pantoprazole (PROTONIX) 40 MG tablet TAKE 1 TABLET BY MOUTH DAILY. 02/14/18   [name], MD  pregabalin (LYRICA) 75 MG capsule Take 1 capsule (75 mg total) by mouth 2  (two) times daily. 10/05/17   Jamse Arn, MD  senna-docusate (SENOKOT-S) 8.6-50 MG tablet Take 1 tablet by mouth 2 (two) times daily. Patient taking differently: Take 1 tablet by mouth daily as needed for moderate constipation.  08/30/16   Geradine Girt, DO  trimethoprim-polymyxin b (POLYTRIM) ophthalmic solution INSTILL ONE DROP INTO BOTH EYES 4 TIMES A DAY FOR 2 DAYS AFTER EACH MONTHLY EYE INJECTION 06/16/17   [provider]    Family History Family History  Problem Relation Age of Onset  . Hypertension Father   . Hypertension Sister   . Hypertension Brother   . Breast cancer Cousin        x2    Social History Social History   Tobacco Use  . Smoking status: Former Smoker    Years: 20.00    Types: Cigarettes    Last attempt to quit: 11/2016    Years since quitting: 1.3  . Smokeless tobacco: Never Used  Substance Use Topics  . Alcohol use: No    Alcohol/week: 0.0 standard drinks  . Drug use: No     Allergies   Penicillins and Lisinopril   Review of Systems Review of Systems  HENT: Negative for drooling and facial swelling.   Respiratory: Negative for shortness of breath.   Cardiovascular: Negative for chest pain.  Skin: Positive for itching and rash.  All other systems reviewed and are negative.    Physical Exam Updated Vital Signs BP (!) 165/84   Pulse 94   Temp 98.6 F (37 C) (Oral)   Resp 18   SpO2 100%   Physical Exam Vitals signs and nursing note reviewed.  Constitutional:      Appearance: Normal appearance. She is normal weight.  HENT:     Head: Normocephalic and atraumatic.     Comments: No swelling of the lips tongue or uvula    Nose: Nose normal.     Mouth/Throat:     Mouth: Mucous membranes are moist.     Pharynx: Oropharynx is clear.  Eyes:     Conjunctiva/sclera: Conjunctivae normal.     Pupils: Pupils are equal, round, and reactive to light.  Neck:     Musculoskeletal: Normal range of motion and neck supple.   Cardiovascular:     Rate and Rhythm: Normal rate and regular rhythm.     Pulses: Normal pulses.  Heart sounds: Normal heart sounds.  Pulmonary:     Effort: Pulmonary effort is normal.     Breath sounds: Normal breath sounds.  Abdominal:     General: Abdomen is flat. Bowel sounds are normal.     Tenderness: There is no abdominal tenderness. There is no guarding.  Musculoskeletal: Normal range of motion.  Skin:    General: Skin is warm and dry.     Capillary Refill: Capillary refill takes less than 2 seconds.     Findings: No rash.  Neurological:     General: No focal deficit present.     Mental Status: She is alert and oriented to person, place, and time.  Psychiatric:        Mood and Affect: Mood normal.        Behavior: Behavior normal.      ED Treatments / Results  Labs (all labs ordered are listed, but only abnormal results are displayed) Labs Reviewed - No data to display  EKG None  Radiology No results found.  Procedures Procedures (including critical care time)  Medications Ordered in ED Medications  predniSONE (DELTASONE) tablet 60 mg (60 mg Oral Given 03/24/18 0238)  famotidine (PEPCID) tablet 20 mg (20 mg Oral Given 03/24/18 0238)     Will start prednisone taper.  Follow up with your PMD closely for ongoing care  Final Clinical Impressions(s) / ED Diagnoses   Return for intractable cough, coughing up blood,fevers >100.4 unrelieved by medication, shortness of breath, intractable vomiting, chest pain, shortness of breath, weakness,numbness, changes in speech, facial asymmetry,abdominal pain, passing out,Inability to tolerate liquids or food, cough, altered mental status or any concerns. No signs of systemic illness or infection. The patient is nontoxic-appearing on exam and vital signs are within normal limits.   I have reviewed the triage vital signs and the nursing notes. Pertinent labs &imaging results that were available during my care of the  patient were reviewed by me and considered in my medical decision making (see chart for details).  After history, exam, and medical workup I feel the patient has been appropriately medically screened and is safe for discharge home. Pertinent diagnoses were discussed with the patient. Patient was given return precautions.   Ringo Sherod, MD 03/24/18 [id number]

## 2018-03-25 DIAGNOSIS — N186 End stage renal disease: Secondary | ICD-10-CM | POA: Diagnosis not present

## 2018-03-25 DIAGNOSIS — Z79899 Other long term (current) drug therapy: Secondary | ICD-10-CM | POA: Diagnosis not present

## 2018-03-25 DIAGNOSIS — D509 Iron deficiency anemia, unspecified: Secondary | ICD-10-CM | POA: Diagnosis not present

## 2018-03-25 DIAGNOSIS — E1129 Type 2 diabetes mellitus with other diabetic kidney complication: Secondary | ICD-10-CM | POA: Diagnosis not present

## 2018-03-25 DIAGNOSIS — D631 Anemia in chronic kidney disease: Secondary | ICD-10-CM | POA: Diagnosis not present

## 2018-03-26 DIAGNOSIS — N186 End stage renal disease: Secondary | ICD-10-CM | POA: Diagnosis not present

## 2018-03-26 DIAGNOSIS — Z79899 Other long term (current) drug therapy: Secondary | ICD-10-CM | POA: Diagnosis not present

## 2018-03-26 DIAGNOSIS — D509 Iron deficiency anemia, unspecified: Secondary | ICD-10-CM | POA: Diagnosis not present

## 2018-03-26 DIAGNOSIS — D631 Anemia in chronic kidney disease: Secondary | ICD-10-CM | POA: Diagnosis not present

## 2018-03-26 DIAGNOSIS — E1129 Type 2 diabetes mellitus with other diabetic kidney complication: Secondary | ICD-10-CM | POA: Diagnosis not present

## 2018-03-27 DIAGNOSIS — E1129 Type 2 diabetes mellitus with other diabetic kidney complication: Secondary | ICD-10-CM | POA: Diagnosis not present

## 2018-03-27 DIAGNOSIS — N186 End stage renal disease: Secondary | ICD-10-CM | POA: Diagnosis not present

## 2018-03-27 DIAGNOSIS — Z79899 Other long term (current) drug therapy: Secondary | ICD-10-CM | POA: Diagnosis not present

## 2018-03-27 DIAGNOSIS — D509 Iron deficiency anemia, unspecified: Secondary | ICD-10-CM | POA: Diagnosis not present

## 2018-03-27 DIAGNOSIS — D631 Anemia in chronic kidney disease: Secondary | ICD-10-CM | POA: Diagnosis not present

## 2018-03-28 DIAGNOSIS — E1129 Type 2 diabetes mellitus with other diabetic kidney complication: Secondary | ICD-10-CM | POA: Diagnosis not present

## 2018-03-28 DIAGNOSIS — N186 End stage renal disease: Secondary | ICD-10-CM | POA: Diagnosis not present

## 2018-03-28 DIAGNOSIS — Z79899 Other long term (current) drug therapy: Secondary | ICD-10-CM | POA: Diagnosis not present

## 2018-03-28 DIAGNOSIS — D509 Iron deficiency anemia, unspecified: Secondary | ICD-10-CM | POA: Diagnosis not present

## 2018-03-28 DIAGNOSIS — D631 Anemia in chronic kidney disease: Secondary | ICD-10-CM | POA: Diagnosis not present

## 2018-03-29 ENCOUNTER — Other Ambulatory Visit: Payer: Self-pay | Admitting: Family Medicine

## 2018-03-29 ENCOUNTER — Other Ambulatory Visit: Payer: Self-pay

## 2018-03-29 ENCOUNTER — Encounter: Payer: Self-pay | Admitting: Neurology

## 2018-03-29 ENCOUNTER — Ambulatory Visit (INDEPENDENT_AMBULATORY_CARE_PROVIDER_SITE_OTHER): Payer: Medicare Other | Admitting: Neurology

## 2018-03-29 VITALS — BP 138/70 | HR 91 | Temp 97.9°F | Ht 65.0 in | Wt 179.2 lb

## 2018-03-29 DIAGNOSIS — D509 Iron deficiency anemia, unspecified: Secondary | ICD-10-CM | POA: Diagnosis not present

## 2018-03-29 DIAGNOSIS — I2510 Atherosclerotic heart disease of native coronary artery without angina pectoris: Secondary | ICD-10-CM | POA: Diagnosis not present

## 2018-03-29 DIAGNOSIS — Z9861 Coronary angioplasty status: Secondary | ICD-10-CM

## 2018-03-29 DIAGNOSIS — Z1231 Encounter for screening mammogram for malignant neoplasm of breast: Secondary | ICD-10-CM

## 2018-03-29 DIAGNOSIS — N186 End stage renal disease: Secondary | ICD-10-CM | POA: Diagnosis not present

## 2018-03-29 DIAGNOSIS — E1342 Other specified diabetes mellitus with diabetic polyneuropathy: Secondary | ICD-10-CM

## 2018-03-29 DIAGNOSIS — Z79899 Other long term (current) drug therapy: Secondary | ICD-10-CM | POA: Diagnosis not present

## 2018-03-29 DIAGNOSIS — D631 Anemia in chronic kidney disease: Secondary | ICD-10-CM | POA: Diagnosis not present

## 2018-03-29 DIAGNOSIS — E1129 Type 2 diabetes mellitus with other diabetic kidney complication: Secondary | ICD-10-CM | POA: Diagnosis not present

## 2018-03-29 NOTE — Patient Instructions (Signed)
Please call [phone] for follow-up visit with Dr. Delice Lesch, Pain Management

## 2018-03-29 NOTE — Progress Notes (Signed)
Follow-up Visit   Date: 03/29/18    Susan Horton MRN: [id number] DOB: 1970-12-10   Interim History: Susan Horton is a 49 y.o. right-handed African American female with poorly controlled insulin-dependent diabetes mellitus (LOV5I 11) complicated by CKD, neuropathy, and gastroparesis, hypertension, CAD s/p PCI stent to LAD (2018), and GERD returning for evaluation of diabetic neuropathy. The patient was accompanied to the clinic by self.  History of present illness: Patient was diagnosed with diabetes at the age of 23 while pregnant with her daughter and has been on insulin since 1999.  Unfortunately, her diabetes has never been under good control and she suffered for complications from this including neuropathy, CKD, retinopathy, and gastroparesis. She has neuropathy for many years, but symptoms have got worse since 2016.  She has numbness, burning and tingling pain of the feet and the mid-calf.  She has difficulty with balance/falls and walk unassisted. Last year, she reports unknowingly having a needle in the sole of her foot which was seen incidentally in x-ray of her foot. Since this time, she is very cautious about examining her feet daily. She takes gabapentin 600mg  BID and Cymbalta 30mg  daily.  About a year ago, she also started having numbness and tingling of the fingertips and occasionally in the forearms.  She was working as a Designer, industrial/product and has always been very independent, but because of her medical problems stopped working in 2017.  She has no history of alcohol use, family history of neuropathy, or vitamin B12 deficiency.   UPDATE 11/05/2016:   Despite being on maximal dose gabapentin and Cymbalta (renally dosed), she does not appreciate any benefit with pain relief.  Her gabapentin was increased to 600mg  TID which only makes her sleepy and tired. She had tried OTC ointments and creams. She finds herself crying at times, because the pain can be so severe.   UPDATE  01/05/2017:   She has been slowly reducing the dose of gabapentin due to cognitive side effects and now takes 300mg  at bedtime only, which has significantly helped her stay awake during the day.  Pain has not become worse while reducing the dose.  She is taking nortriptyline 20mg  at bedtime and Cymbalta 60mg  daily.  Unfortunately, she cannot appreciate any relief of her pain.  UPDATE 06/03/2017:  She is here for 6 month follow-up visit. She is undergoing work-up for possible kidney transplant and may be started hemodialysis in the future.  Unfortunately, she continues to have unrelenting neuropathic pain of the feet and lower legs.  She does not get any relief with nortriptyline 20mg  and tapered off gabapentin due to no benefit and sedation.  She denies any new weakness and continues to walk unassisted. She has self-restricted driving due to inability to feel the pedals when driving.  She has poor quality of life because nothing seems to give any relief of her pain.  She has tried very hard to manage blood sugars which is down to 8.5, previously 11, which is great.    UPDATE 03/29/2018:  She is here with ongoing complaints of painful paresthesias for neuropathy and was referred to Pain management in September who started Lyrica 75mg  .  No new weakness or falls.   She started dialysis in June and is tolerating this well.   She appears much more comfortable at today's visit.    Medications:  Current Outpatient Medications on File Prior to Visit  Medication Sig Dispense Refill  . albuterol (PROVENTIL HFA;VENTOLIN HFA) 108 (90  BASE) MCG/ACT inhaler Inhale 1-2 puffs into the lungs every 6 (six) hours as needed for wheezing or shortness of breath.     [name] aspirin 81 MG chewable tablet Chew 1 tablet (81 mg total) by mouth daily. 30 tablet 0  . calcium acetate (PHOSLO) 667 MG capsule TAKE 1 CAPSULE BY MOUTH THREE TIMES DAILY WITH MEALS AND 1 CAPSULE TWO TIMES DAILY WITH SNACKS  10  . capsicum (ZOSTRIX) 0.075 %  topical cream Apply 1 application topically 2 (two) times daily. 28.3 g 0  . clopidogrel (PLAVIX) 75 MG tablet Take 1 tablet (75 mg total) by mouth daily with breakfast. 90 tablet 3  . Evolocumab (REPATHA SURECLICK) 962 MG/ML SOAJ Inject 140 mg into the skin every 14 (fourteen) days. 2 pen 11  . Ferrous Sulfate (IRON) 325 (65 Fe) MG TABS Take 325 mg by mouth daily. 30 each 3  . fluconazole (DIFLUCAN) 150 MG tablet Take 1 tablet (150 mg total) by mouth daily. 2 tablet 0  . Fluticasone-Salmeterol (ADVAIR) 250-50 MCG/DOSE AEPB Inhale 1 puff into the lungs 2 (two) times daily. (Patient taking differently: Inhale 1 puff into the lungs daily as needed (shortness of breath). ) 60 each 3  . Insulin Glargine (TOUJEO SOLOSTAR) 300 UNIT/ML SOPN Inject 25 Units into the skin 2 (two) times daily. 45 mL 3  . insulin regular (HUMULIN R) 100 units/mL injection Inject 0.2-0.25 mLs (20-25 Units total) into the skin See admin instructions. 45 mL 3  . lidocaine (XYLOCAINE) 5 % ointment Apply 1 application topically as needed. 35.44 g 0  . lidocaine-prilocaine (EMLA) cream APPLY SMALL AMOUNT TO ACCESS SITE (AVF) 30 MINUTES BEFORE DIALYSIS. COVER WITH OCCLUSIVE DRESSING (SARAN WRAP) THREE DAYS A WEEK (3 TIMES A  5  . lovastatin (MEVACOR) 20 MG tablet Take 20 mg by mouth at bedtime.     . methocarbamol (ROBAXIN) 500 MG tablet Take 1 tablet (500 mg total) by mouth 2 (two) times daily as needed for muscle spasms. 60 tablet 1  . metoCLOPramide (REGLAN) 5 MG tablet TAKE 1 TABLET BY MOUTH 4 TIMES DAILY BEFORE MEALS AND AT BEDTIME. (Patient taking differently: Take 5 mg by mouth 4 (four) times daily -  before meals and at bedtime. ) 90 tablet 0  . metoprolol succinate (TOPROL-XL) 50 MG 24 hr tablet Take 1 tablet (50 mg total) by mouth daily. 90 tablet 3  . montelukast (SINGULAIR) 10 MG tablet Take 10 mg by mouth daily as needed (allergies).     . multivitamin (RENA-VIT) TABS tablet Take 1 tablet by mouth at bedtime.  0  .  nitroGLYCERIN (NITROSTAT) 0.4 MG SL tablet Place 1 tablet (0.4 mg total) under the tongue every 5 (five) minutes as needed for chest pain. 25 tablet 2  . pantoprazole (PROTONIX) 40 MG tablet TAKE 1 TABLET BY MOUTH DAILY. 30 tablet 7  . predniSONE (DELTASONE) 20 MG tablet 3 tabs po day one, then 2 po daily x 4 days 11 tablet 0  . pregabalin (LYRICA) 75 MG capsule Take 1 capsule (75 mg total) by mouth 2 (two) times daily. 30 capsule 1  . senna-docusate (SENOKOT-S) 8.6-50 MG tablet Take 1 tablet by mouth 2 (two) times daily. (Patient taking differently: Take 1 tablet by mouth daily as needed for moderate constipation. )    . trimethoprim-polymyxin b (POLYTRIM) ophthalmic solution INSTILL ONE DROP INTO BOTH EYES 4 TIMES A DAY FOR 2 DAYS AFTER EACH MONTHLY EYE INJECTION  12  . isosorbide mononitrate (IMDUR) 30 MG  24 hr tablet Take 0.5 tablets (15 mg total) by mouth daily. 45 tablet 3   No current facility-administered medications on file prior to visit.     Allergies:  Allergies  Allergen Reactions  . Penicillins Anaphylaxis, Itching, Swelling, Rash and Other (See Comments)    Swelling of throat & whole mouth  Has patient had a PCN reaction causing immediate rash, facial/tongue/throat swelling, SOB or lightheadedness with hypotension: Yes Has patient had a PCN reaction causing severe rash involving mucus membranes or skin necrosis: No Has patient had a PCN reaction that required hospitalization: No Has patient had a PCN reaction occurring within the last 10 years: No If all of the above answers are "NO", then may proceed with Cephalosporin use.  [name] Lisinopril Cough    Review of Systems:  CONSTITUTIONAL: No fevers, chills, night sweats, or weight loss.  EYES: No visual changes or eye pain ENT: No hearing changes.  No history of nose bleeds.   RESPIRATORY: No cough, wheezing and shortness of breath.   CARDIOVASCULAR: Negative for chest pain, and palpitations.   GI: Negative for abdominal  discomfort, blood in stools or black stools.  No recent change in bowel habits.   GU:  No history of incontinence.   MUSCLOSKELETAL: +history of joint pain or swelling.  No myalgias.   SKIN: Negative for lesions, rash, and itching.   ENDOCRINE: Negative for cold or heat intolerance, polydipsia or goiter.   PSYCH:  + depression or anxiety symptoms.   NEURO: As Above.   Vital Signs:  BP 138/70   Pulse 91   Temp 97.9 F (36.6 C) (Oral)   Ht 5\' 5"  (1.651 m)   Wt 179 lb 4 oz (81.3 kg)   SpO2 99%   BMI 29.83 kg/m   General Medical Exam:   General:  Well appearing, comfortable  Neck:  No carotid bruits. Respiratory:  Clear to auscultation, good air entry bilaterally.   Cardiac:  Regular rate and rhythm, no murmur.   Ext: LUE fistula, no edema or lesions  Neurological Exam:  Neurological Exam: MENTAL STATUS including orientation to time, place, person, recent and remote memory, attention span and concentration, language, and fund of knowledge is normal.  Speech is not dysarthric.  CRANIAL NERVES:  Pupils are round and reactive.  Extraocular muscles intact. Face is symmetric.   MOTOR:  Motor strength is 5/5 in all extremities, except R toe flexion 2/5 and finger abductors and extensors 4/5.   MSRs:  Reflexes are 1+/4 in the arms and absent in the legs  SENSORY:  Vibration is reduced to 50% at the knees and trace distal to ankles bilaterally.   Reduced temperature and light touch distal to knees bilaterally and over the finger tips.  GAIT:  Mildly-wide-based, stable and unassisted    DATA:   Lab Results  Component Value Date   HGBA1C 9.8 (A) 03/22/2018   Lab Results  Component Value Date   [id number] 425 11/05/2016   Lab Results  Component Value Date   TSH 1.930 07/26/2016   NCS/EMG 11/23/2016 right side: The electrophysiologic findings are consistent with a chronic, length-dependent sensorimotor polyneuropathy, axon loss and demyelinating in type, affecting the right  arm and leg.    EKG 03/22/2017:  QTc 438  IMPRESSION/PLAN: Diabetic painful neuropathy in the setting of poorly controlled diabetes and ESRD on dialysis.  Her exam shows stocking-glove distribution of paresthesias, distal weakness, and sensory ataxia.   Previously tried:  Gabapentin (ineffective), nortriptyline, Cymbalta  She was  referred to Pain Management and started on Lyrica 75mg  BID and has no benefit. I advised her to follow-up with them for ongoing pain management.  Fall precautions discussed. Continue daly foot inspection  Return to clinic as needed   Thank you for allowing me to participate in patient's care.  If I can answer any additional questions, I would be pleased to do so.    Sincerely,    Donika K. Posey Pronto, DO

## 2018-03-30 DIAGNOSIS — D631 Anemia in chronic kidney disease: Secondary | ICD-10-CM | POA: Diagnosis not present

## 2018-03-30 DIAGNOSIS — E1129 Type 2 diabetes mellitus with other diabetic kidney complication: Secondary | ICD-10-CM | POA: Diagnosis not present

## 2018-03-30 DIAGNOSIS — N186 End stage renal disease: Secondary | ICD-10-CM | POA: Diagnosis not present

## 2018-03-30 DIAGNOSIS — Z79899 Other long term (current) drug therapy: Secondary | ICD-10-CM | POA: Diagnosis not present

## 2018-03-30 DIAGNOSIS — D509 Iron deficiency anemia, unspecified: Secondary | ICD-10-CM | POA: Diagnosis not present

## 2018-03-31 DIAGNOSIS — Z79899 Other long term (current) drug therapy: Secondary | ICD-10-CM | POA: Diagnosis not present

## 2018-03-31 DIAGNOSIS — D509 Iron deficiency anemia, unspecified: Secondary | ICD-10-CM | POA: Diagnosis not present

## 2018-03-31 DIAGNOSIS — D631 Anemia in chronic kidney disease: Secondary | ICD-10-CM | POA: Diagnosis not present

## 2018-03-31 DIAGNOSIS — N186 End stage renal disease: Secondary | ICD-10-CM | POA: Diagnosis not present

## 2018-03-31 DIAGNOSIS — E1129 Type 2 diabetes mellitus with other diabetic kidney complication: Secondary | ICD-10-CM | POA: Diagnosis not present

## 2018-04-01 DIAGNOSIS — D631 Anemia in chronic kidney disease: Secondary | ICD-10-CM | POA: Diagnosis not present

## 2018-04-01 DIAGNOSIS — E1129 Type 2 diabetes mellitus with other diabetic kidney complication: Secondary | ICD-10-CM | POA: Diagnosis not present

## 2018-04-01 DIAGNOSIS — Z79899 Other long term (current) drug therapy: Secondary | ICD-10-CM | POA: Diagnosis not present

## 2018-04-01 DIAGNOSIS — D509 Iron deficiency anemia, unspecified: Secondary | ICD-10-CM | POA: Diagnosis not present

## 2018-04-01 DIAGNOSIS — N186 End stage renal disease: Secondary | ICD-10-CM | POA: Diagnosis not present

## 2018-04-02 DIAGNOSIS — E1129 Type 2 diabetes mellitus with other diabetic kidney complication: Secondary | ICD-10-CM | POA: Diagnosis not present

## 2018-04-02 DIAGNOSIS — Z79899 Other long term (current) drug therapy: Secondary | ICD-10-CM | POA: Diagnosis not present

## 2018-04-02 DIAGNOSIS — D509 Iron deficiency anemia, unspecified: Secondary | ICD-10-CM | POA: Diagnosis not present

## 2018-04-02 DIAGNOSIS — D631 Anemia in chronic kidney disease: Secondary | ICD-10-CM | POA: Diagnosis not present

## 2018-04-02 DIAGNOSIS — N186 End stage renal disease: Secondary | ICD-10-CM | POA: Diagnosis not present

## 2018-04-03 DIAGNOSIS — E1129 Type 2 diabetes mellitus with other diabetic kidney complication: Secondary | ICD-10-CM | POA: Diagnosis not present

## 2018-04-03 DIAGNOSIS — N186 End stage renal disease: Secondary | ICD-10-CM | POA: Diagnosis not present

## 2018-04-03 DIAGNOSIS — D509 Iron deficiency anemia, unspecified: Secondary | ICD-10-CM | POA: Diagnosis not present

## 2018-04-03 DIAGNOSIS — D631 Anemia in chronic kidney disease: Secondary | ICD-10-CM | POA: Diagnosis not present

## 2018-04-03 DIAGNOSIS — Z79899 Other long term (current) drug therapy: Secondary | ICD-10-CM | POA: Diagnosis not present

## 2018-04-04 DIAGNOSIS — D509 Iron deficiency anemia, unspecified: Secondary | ICD-10-CM | POA: Diagnosis not present

## 2018-04-04 DIAGNOSIS — N186 End stage renal disease: Secondary | ICD-10-CM | POA: Diagnosis not present

## 2018-04-04 DIAGNOSIS — Z79899 Other long term (current) drug therapy: Secondary | ICD-10-CM | POA: Diagnosis not present

## 2018-04-04 DIAGNOSIS — D631 Anemia in chronic kidney disease: Secondary | ICD-10-CM | POA: Diagnosis not present

## 2018-04-04 DIAGNOSIS — E1129 Type 2 diabetes mellitus with other diabetic kidney complication: Secondary | ICD-10-CM | POA: Diagnosis not present

## 2018-04-05 DIAGNOSIS — Z79899 Other long term (current) drug therapy: Secondary | ICD-10-CM | POA: Diagnosis not present

## 2018-04-05 DIAGNOSIS — D631 Anemia in chronic kidney disease: Secondary | ICD-10-CM | POA: Diagnosis not present

## 2018-04-05 DIAGNOSIS — N186 End stage renal disease: Secondary | ICD-10-CM | POA: Diagnosis not present

## 2018-04-05 DIAGNOSIS — E1129 Type 2 diabetes mellitus with other diabetic kidney complication: Secondary | ICD-10-CM | POA: Diagnosis not present

## 2018-04-05 DIAGNOSIS — D509 Iron deficiency anemia, unspecified: Secondary | ICD-10-CM | POA: Diagnosis not present

## 2018-04-06 DIAGNOSIS — E1129 Type 2 diabetes mellitus with other diabetic kidney complication: Secondary | ICD-10-CM | POA: Diagnosis not present

## 2018-04-06 DIAGNOSIS — D509 Iron deficiency anemia, unspecified: Secondary | ICD-10-CM | POA: Diagnosis not present

## 2018-04-06 DIAGNOSIS — Z79899 Other long term (current) drug therapy: Secondary | ICD-10-CM | POA: Diagnosis not present

## 2018-04-06 DIAGNOSIS — N186 End stage renal disease: Secondary | ICD-10-CM | POA: Diagnosis not present

## 2018-04-06 DIAGNOSIS — D631 Anemia in chronic kidney disease: Secondary | ICD-10-CM | POA: Diagnosis not present

## 2018-04-07 DIAGNOSIS — N186 End stage renal disease: Secondary | ICD-10-CM | POA: Diagnosis not present

## 2018-04-07 DIAGNOSIS — E1129 Type 2 diabetes mellitus with other diabetic kidney complication: Secondary | ICD-10-CM | POA: Diagnosis not present

## 2018-04-07 DIAGNOSIS — D631 Anemia in chronic kidney disease: Secondary | ICD-10-CM | POA: Diagnosis not present

## 2018-04-07 DIAGNOSIS — D509 Iron deficiency anemia, unspecified: Secondary | ICD-10-CM | POA: Diagnosis not present

## 2018-04-07 DIAGNOSIS — Z79899 Other long term (current) drug therapy: Secondary | ICD-10-CM | POA: Diagnosis not present

## 2018-04-08 DIAGNOSIS — E1129 Type 2 diabetes mellitus with other diabetic kidney complication: Secondary | ICD-10-CM | POA: Diagnosis not present

## 2018-04-08 DIAGNOSIS — D631 Anemia in chronic kidney disease: Secondary | ICD-10-CM | POA: Diagnosis not present

## 2018-04-08 DIAGNOSIS — Z79899 Other long term (current) drug therapy: Secondary | ICD-10-CM | POA: Diagnosis not present

## 2018-04-08 DIAGNOSIS — D509 Iron deficiency anemia, unspecified: Secondary | ICD-10-CM | POA: Diagnosis not present

## 2018-04-08 DIAGNOSIS — N186 End stage renal disease: Secondary | ICD-10-CM | POA: Diagnosis not present

## 2018-04-09 DIAGNOSIS — D631 Anemia in chronic kidney disease: Secondary | ICD-10-CM | POA: Diagnosis not present

## 2018-04-09 DIAGNOSIS — D509 Iron deficiency anemia, unspecified: Secondary | ICD-10-CM | POA: Diagnosis not present

## 2018-04-09 DIAGNOSIS — E1129 Type 2 diabetes mellitus with other diabetic kidney complication: Secondary | ICD-10-CM | POA: Diagnosis not present

## 2018-04-09 DIAGNOSIS — Z79899 Other long term (current) drug therapy: Secondary | ICD-10-CM | POA: Diagnosis not present

## 2018-04-09 DIAGNOSIS — N186 End stage renal disease: Secondary | ICD-10-CM | POA: Diagnosis not present

## 2018-04-10 DIAGNOSIS — Z79899 Other long term (current) drug therapy: Secondary | ICD-10-CM | POA: Diagnosis not present

## 2018-04-10 DIAGNOSIS — E1129 Type 2 diabetes mellitus with other diabetic kidney complication: Secondary | ICD-10-CM | POA: Diagnosis not present

## 2018-04-10 DIAGNOSIS — D631 Anemia in chronic kidney disease: Secondary | ICD-10-CM | POA: Diagnosis not present

## 2018-04-10 DIAGNOSIS — D509 Iron deficiency anemia, unspecified: Secondary | ICD-10-CM | POA: Diagnosis not present

## 2018-04-10 DIAGNOSIS — N186 End stage renal disease: Secondary | ICD-10-CM | POA: Diagnosis not present

## 2018-04-10 MED FILL — ISOSORBIDE MN ER 30 MG TAB: 30 | 90 days supply | Qty: 45 | Fill #5

## 2018-04-10 MED FILL — METOPROLOL SUCCINATE ER 50: 50 | 90 days supply | Qty: 90 | Fill #1

## 2018-04-10 MED FILL — hydrOXYzine HCL 25 MG TABS: 25 | 24 days supply | Qty: 100 | Fill #1

## 2018-04-10 MED FILL — PANTOPRAZOLE SOD DR 40 MG T: 40 | 90 days supply | Qty: 90 | Fill #2

## 2018-04-10 MED FILL — CLOPIDOGREL 75 MG TABLET: 75 | 90 days supply | Qty: 90 | Fill #4

## 2018-04-10 MED FILL — METOCLOPRAMIDE 5 MG TABLET: 5 | 90 days supply | Qty: 360 | Fill #1

## 2018-04-11 DIAGNOSIS — N186 End stage renal disease: Secondary | ICD-10-CM | POA: Diagnosis not present

## 2018-04-11 DIAGNOSIS — D509 Iron deficiency anemia, unspecified: Secondary | ICD-10-CM | POA: Diagnosis not present

## 2018-04-11 DIAGNOSIS — Z79899 Other long term (current) drug therapy: Secondary | ICD-10-CM | POA: Diagnosis not present

## 2018-04-11 DIAGNOSIS — D631 Anemia in chronic kidney disease: Secondary | ICD-10-CM | POA: Diagnosis not present

## 2018-04-11 DIAGNOSIS — E1129 Type 2 diabetes mellitus with other diabetic kidney complication: Secondary | ICD-10-CM | POA: Diagnosis not present

## 2018-04-12 ENCOUNTER — Encounter: Payer: Self-pay | Admitting: Allergy

## 2018-04-12 ENCOUNTER — Other Ambulatory Visit: Payer: Self-pay

## 2018-04-12 ENCOUNTER — Ambulatory Visit (INDEPENDENT_AMBULATORY_CARE_PROVIDER_SITE_OTHER): Payer: Medicare Other | Admitting: Allergy

## 2018-04-12 VITALS — BP 150/70 | HR 87 | Temp 98.0°F | Resp 18 | Ht 66.0 in | Wt 172.4 lb

## 2018-04-12 DIAGNOSIS — L299 Pruritus, unspecified: Secondary | ICD-10-CM | POA: Diagnosis not present

## 2018-04-12 DIAGNOSIS — I2510 Atherosclerotic heart disease of native coronary artery without angina pectoris: Secondary | ICD-10-CM

## 2018-04-12 DIAGNOSIS — Z9861 Coronary angioplasty status: Secondary | ICD-10-CM

## 2018-04-12 DIAGNOSIS — J4520 Mild intermittent asthma, uncomplicated: Secondary | ICD-10-CM | POA: Diagnosis not present

## 2018-04-12 DIAGNOSIS — D509 Iron deficiency anemia, unspecified: Secondary | ICD-10-CM | POA: Diagnosis not present

## 2018-04-12 DIAGNOSIS — N186 End stage renal disease: Secondary | ICD-10-CM | POA: Diagnosis not present

## 2018-04-12 DIAGNOSIS — R82998 Other abnormal findings in urine: Secondary | ICD-10-CM | POA: Diagnosis not present

## 2018-04-12 DIAGNOSIS — J3089 Other allergic rhinitis: Secondary | ICD-10-CM | POA: Diagnosis not present

## 2018-04-12 DIAGNOSIS — E1129 Type 2 diabetes mellitus with other diabetic kidney complication: Secondary | ICD-10-CM | POA: Diagnosis not present

## 2018-04-12 DIAGNOSIS — D631 Anemia in chronic kidney disease: Secondary | ICD-10-CM | POA: Diagnosis not present

## 2018-04-12 DIAGNOSIS — E7849 Other hyperlipidemia: Secondary | ICD-10-CM | POA: Diagnosis not present

## 2018-04-12 DIAGNOSIS — E1122 Type 2 diabetes mellitus with diabetic chronic kidney disease: Secondary | ICD-10-CM | POA: Diagnosis not present

## 2018-04-12 DIAGNOSIS — N2581 Secondary hyperparathyroidism of renal origin: Secondary | ICD-10-CM | POA: Diagnosis not present

## 2018-04-12 DIAGNOSIS — Z992 Dependence on renal dialysis: Secondary | ICD-10-CM | POA: Diagnosis not present

## 2018-04-12 MED ORDER — MONTELUKAST SODIUM 10 MG PO TABS: 10.0000 mg | ORAL_TABLET | Freq: Every day | ORAL | 5 refills | Status: DC

## 2018-04-12 NOTE — Patient Instructions (Addendum)
Itching  - chronic itching with rash  - will have you take long-acting antihistamine Xyzal 5mg  daily to see if this will help with itch control  - take your Singulair 10mg  daily to help with itch control  - daily moisturizing with a thick creamy lotion or ointment like Eucerin, Cetaphil, CeraVe, Aquafor to help with moisturization  - will obtain environmental allergy panel, TSH and alpha gal panel.  Will also await results of CBC and renal panel done today.    Follow-up 2-3 months or sooner if needed

## 2018-04-12 NOTE — Progress Notes (Signed)
New Patient Note  RE: Susan Horton MRN: [id number] DOB: [birth year] Date of Office Visit: 04/12/2018  Referring provider: Charlott Rakes, MD Primary care provider: Charlott Rakes, MD  Chief Complaint: itching  History of present illness: Susan Horton is a 48 y.o. female presenting today for consultation for itching.   She has complex medical history as below in [place].    She states she has been having itching for the past 1.5 years.  She does feel that with her kidney failure the itching has gotten worse.   She has tried different lotions and creams that have not helped.  Benadryl has not worked.   She has not noted any foods that seem to make itching worse.  She eats a lot of raw veggies and fruits and occasionally eats red meats.   She will notice small raised bumps that will resolve over 1-2 days primarily over abdomen and back but does not currently have rash.  She has noted that the bumps will leave some dark marks.  She does note joint aches of her knees.  No fevers.   She does peritoneal dialysis nightly at home which she states does make her itchier however reports she was having issues with itching prior to starting dialysis.  She reports she was on gabapentin for a while due to diabetic neuropathy but states it wasn't helping thus she is no longer taking this.  She states she did have the itching while on gabapentin and didn't feel it helped the itch either.     She had an ED visit on 03/23/2018 for worsening itching after starting new injectable cholesterol medication (repatha).  Exam was unremarkable.   She was prescribed prednisone and pepcid to take after d/c.  She states with prednisone the itching stopped but returned when she finished the taper.  She no longer is on repatha.  She was itching well before she started on repatha.    She does have history of asthma.  She states she was prescribed singulair that she takes as needed if the "pollen is heavy" and if here her  allergy symptoms are occurring she will use singulair.  However she believes the last time she took singulair was about a year ago.  She also has Advair that she also uses as needed and will take when is taking the singulair thus has been about a year as well since last used Advair.  She denies any current cough, wheeze, SOB or chest tightness.    She does report childhood eczema extending up to her late 52s but since then she denies any issues or flares.   She reports with her alleriges she can develop nasal congestion/drainage, sneezing and itchy eyes.   She has never had allergy testing before.  She has not tried use of long-acting antihistamines on consistent basis.    No history of food allergy.   She follows with nephrology at [facility] and have routine kidney labs drawn once a month.    Review of systems: Review of Systems  Constitutional: Negative for chills and fever.  HENT: Positive for congestion. Negative for ear discharge, ear pain, nosebleeds and sore throat.   Eyes: Negative for pain, discharge and redness.  Respiratory: Negative for cough, shortness of breath and wheezing.   Cardiovascular: Negative for chest pain.  Gastrointestinal: Negative for abdominal pain, constipation, diarrhea, heartburn, nausea and vomiting.  Musculoskeletal: Positive for joint pain.  Skin: Positive for itching and rash.  Neurological: Negative for  headaches.    All other systems negative unless noted above in HPI  Past medical history: Past Medical History:  Diagnosis Date  . Anemia   . Angio-edema   . Asthma   . CAD (coronary artery disease)    DES to mid LAD July 2018, residual moderate RCA disease  . Cataract   . CKD (chronic kidney disease) stage 4, GFR 15-29 ml/min (HCC)    Dialysis T/Th/Sa  . DVT (deep venous thrombosis) ([place])    1996 during pregnancy, 2015 left leg  . Eczema   . Essential hypertension 12/11/2015  . Gastroparesis   . GERD (gastroesophageal reflux disease)    . Hidradenitis   . Migraine   . Neuropathy   . Peripheral vascular disease ([place])    blood clot in leg  . Type 2 diabetes mellitus ([place])   . Type II diabetes mellitus ([place])   . Urticaria     Past surgical history: Past Surgical History:  Procedure Laterality Date  . A/V FISTULAGRAM N/A 06/22/2017   Procedure: A/V FISTULAGRAM - left arm;  Surgeon: [name], MD;  Location: [facility];  Service: Cardiovascular;  Laterality: N/A;  . ABDOMINOPLASTY    . ADENOIDECTOMY    . APPENDECTOMY  1995  . AV FISTULA PLACEMENT Left 02/01/2017   Procedure: ARTERIOVENOUS BRACHIOCEPHALIC (AV) FISTULA CREATION;  Surgeon: [name], MD;  Location: [facility];  Service: Vascular;  Laterality: Left;  . CHOLECYSTECTOMY  1993  . CORONARY ANGIOPLASTY WITH STENT PLACEMENT    . CORONARY STENT INTERVENTION N/A 08/05/2016   Procedure: Coronary Stent Intervention;  Surgeon: [name], MD;  Location: [facility];  Service: Cardiovascular;  Laterality: N/A;  . EXPLORATORY LAPAROTOMY  08/14/2005   lysis of adhesions, drainage of tubo-ovarian abscess  . FISTULA SUPERFICIALIZATION Left 09/14/2017   Procedure: FISTULA SUPERFICIALIZATION ARTERIOVENOUS FISTULA LEFT ARM;  Surgeon: [name], MD;  Location: [facility];  Service: Vascular;  Laterality: Left;  . HYDRADENITIS EXCISION  02/08/2011   Procedure: EXCISION HYDRADENITIS GROIN;  Surgeon: [name], MD;  Location: [facility];  Service: General;  Laterality: N/A;  Excisioin of Hidradenitis Left groin  . INGUINAL HIDRADENITIS EXCISION  07/06/2010   bilateral  . INSERTION OF DIALYSIS CATHETER N/A 09/14/2017   Procedure: INSERTION OF TUNNELED DIALYSIS CATHETER Right Internal Jugular;  Surgeon: [name], MD;  Location: [facility];  Service: Vascular;  Laterality: N/A;  . LEFT HEART CATH AND CORONARY ANGIOGRAPHY N/A 08/05/2016   Procedure: Left Heart Cath and Coronary Angiography;  Surgeon: [name], MD;  Location: [facility];  Service: Cardiovascular;  Laterality: N/A;  . REDUCTION MAMMAPLASTY  2002  . TONSILLECTOMY    . TUBAL LIGATION  1996  . VAGINAL HYSTERECTOMY  08/04/2005   and cysto    Family history:  Family History  Problem Relation Age of Onset  . Allergic rhinitis Mother   . Hypertension Father   . Allergic rhinitis Father   . Hypertension Sister   . Allergic rhinitis Sister   . Hypertension Brother   . Allergic rhinitis Brother   . Breast cancer Cousin        x2    Social history: Lives in an apartment with carpeting with gas heating and central cooling.  No pets in the home.  No concern for water damage, mildew or roaches in the home.  She does not work at this time.   Tobacco Use  .  Smoking status: Former Smoker    Years: 20.00    Types: Cigarettes    Last attempt to quit: 11/2016    Years since quitting: 1.4  . Smokeless tobacco: Never Used    Medication List: Allergies as of 04/12/2018      Reactions   Penicillins Anaphylaxis, Itching, Swelling, Rash, Other (See Comments)   Swelling of throat & whole mouth  Has patient had a PCN reaction causing immediate rash, facial/tongue/throat swelling, SOB or lightheadedness with hypotension: Yes Has patient had a PCN reaction causing severe rash involving mucus membranes or skin necrosis: No Has patient had a PCN reaction that required hospitalization: No Has patient had a PCN reaction occurring within the last 10 years: No If all of the above answers are "NO", then may proceed with Cephalosporin use.   Repatha [evolocumab] Itching   Lisinopril Cough      Medication List       Accurate as of April 12, 2018  4:54 PM. Always use your most recent med list.        albuterol 108 (90 Base) MCG/ACT inhaler Commonly known as:  PROVENTIL HFA;VENTOLIN HFA Inhale 1-2 puffs into the lungs every 6 (six) hours as needed for wheezing or shortness of breath.   aspirin 81 MG chewable tablet Chew 1 tablet (81  mg total) by mouth daily.   calcium acetate 667 MG capsule Commonly known as:  PHOSLO TAKE 1 CAPSULE BY MOUTH THREE TIMES DAILY WITH MEALS AND 1 CAPSULE TWO TIMES DAILY WITH SNACKS   capsicum 0.075 % topical cream Commonly known as:  ZOSTRIX Apply 1 application topically 2 (two) times daily.   clopidogrel 75 MG tablet Commonly known as:  PLAVIX Take 1 tablet (75 mg total) by mouth daily with breakfast.   fluconazole 150 MG tablet Commonly known as:  Diflucan Take 1 tablet (150 mg total) by mouth daily.   Fluticasone-Salmeterol 250-50 MCG/DOSE Aepb Commonly known as:  ADVAIR Inhale 1 puff into the lungs 2 (two) times daily.   Insulin Glargine 300 UNIT/ML Sopn Commonly known as:  Toujeo SoloStar Inject 25 Units into the skin 2 (two) times daily.   insulin regular 100 units/mL injection Commonly known as:  HumuLIN R Inject 0.2-0.25 mLs (20-25 Units total) into the skin See admin instructions.   Iron 325 (65 Fe) MG Tabs Take 325 mg by mouth daily.   isosorbide mononitrate 30 MG 24 hr tablet Commonly known as:  IMDUR Take 0.5 tablets (15 mg total) by mouth daily.   lidocaine 5 % ointment Commonly known as:  XYLOCAINE Apply 1 application topically as needed.   lidocaine-prilocaine cream Commonly known as:  EMLA APPLY SMALL AMOUNT TO ACCESS SITE (AVF) 30 MINUTES BEFORE DIALYSIS. COVER WITH OCCLUSIVE DRESSING (SARAN WRAP) THREE DAYS A WEEK (3 TIMES A   lovastatin 20 MG tablet Commonly known as:  MEVACOR Take 20 mg by mouth at bedtime.   methocarbamol 500 MG tablet Commonly known as:  Robaxin Take 1 tablet (500 mg total) by mouth 2 (two) times daily as needed for muscle spasms.   metoCLOPramide 5 MG tablet Commonly known as:  REGLAN TAKE 1 TABLET BY MOUTH 4 TIMES DAILY BEFORE MEALS AND AT BEDTIME.   metoprolol succinate 50 MG 24 hr tablet Commonly known as:  TOPROL-XL Take 1 tablet (50 mg total) by mouth daily.   montelukast 10 MG tablet Commonly known as:   SINGULAIR Take 10 mg by mouth daily as needed (allergies).   montelukast 10 MG tablet  Commonly known as:  SINGULAIR Take 1 tablet (10 mg total) by mouth at bedtime.   multivitamin Tabs tablet Take 1 tablet by mouth at bedtime.   nitroGLYCERIN 0.4 MG SL tablet Commonly known as:  Nitrostat Place 1 tablet (0.4 mg total) under the tongue every 5 (five) minutes as needed for chest pain.   pantoprazole 40 MG tablet Commonly known as:  PROTONIX TAKE 1 TABLET BY MOUTH DAILY.   senna-docusate 8.6-50 MG tablet Commonly known as:  Senokot-S Take 1 tablet by mouth 2 (two) times daily.   trimethoprim-polymyxin b ophthalmic solution Commonly known as:  POLYTRIM INSTILL ONE DROP INTO BOTH EYES 4 TIMES A DAY FOR 2 DAYS AFTER EACH MONTHLY EYE INJECTION       Known medication allergies: Allergies  Allergen Reactions  . Penicillins Anaphylaxis, Itching, Swelling, Rash and Other (See Comments)    Swelling of throat & whole mouth  Has patient had a PCN reaction causing immediate rash, facial/tongue/throat swelling, SOB or lightheadedness with hypotension: Yes Has patient had a PCN reaction causing severe rash involving mucus membranes or skin necrosis: No Has patient had a PCN reaction that required hospitalization: No Has patient had a PCN reaction occurring within the last 10 years: No If all of the above answers are "NO", then may proceed with Cephalosporin use.  . Repatha [Evolocumab] Itching  . Lisinopril Cough     Physical examination: Blood pressure (!) 150/70, pulse 87, temperature 98 F (36.7 C), temperature source Oral, resp. rate 18, height 5\' 6"  (1.676 m), weight 172 lb 6.4 oz (78.2 kg), SpO2 99 %.  General: Alert, interactive, in no acute distress. HEENT: PERRLA, TMs pearly gray, turbinates mildly edematous without discharge, post-pharynx non erythematous. Neck: Supple without lymphadenopathy. Lungs: Clear to auscultation without wheezing, rhonchi or rales. {no increased  work of breathing. CV: Normal S1, S2 without murmurs. Abdomen: Nondistended, nontender. Skin: Warm and dry, without lesions or rashes (no visible or palpable lesions). Extremities:  No clubbing, cyanosis or edema. Neuro:   Grossly intact.  Diagnositics/Labs: None today  Assessment and plan:   Chronic Pruritus with dermatitis  - her kidney disease is playing a role in her degree of itch however she is dialyzing nightly via peritoneal dialysis  - will have you take long-acting antihistamine Xyzal 5mg  daily to see if this will help with itch control  - take your Singulair 10mg  daily to help with itch control  - daily moisturizing with a thick creamy lotion or ointment like Eucerin, Cetaphil, CeraVe, Aquafor to help with moisturization  - will obtain environmental allergy panel, TSH and alpha gal panel.  Will also await results of CBC and renal panel done today.    Allergic rhinitis  - as above will obtain environmental panel  - use long-acting antihistamine as above   - singulair as above  Asthma  - currently being managed by PCP  - she has access to Advair and albuterol inhaler that she is using as needed   - she will be on singulair daily as above at this time which will provide asthma control.    - asthma appears well-controlled on no ICS/LABA at this time  Follow-up 2-3 months or sooner if needed   I appreciate the opportunity to take part in Susan Horton's care. Please do not hesitate to contact me with questions.  Sincerely,   Prudy Feeler, MD Allergy/Immunology [facility] [place]

## 2018-04-13 ENCOUNTER — Telehealth: Payer: Self-pay | Admitting: *Deleted

## 2018-04-13 DIAGNOSIS — N186 End stage renal disease: Secondary | ICD-10-CM | POA: Diagnosis not present

## 2018-04-13 DIAGNOSIS — D509 Iron deficiency anemia, unspecified: Secondary | ICD-10-CM | POA: Diagnosis not present

## 2018-04-13 DIAGNOSIS — D631 Anemia in chronic kidney disease: Secondary | ICD-10-CM | POA: Diagnosis not present

## 2018-04-13 DIAGNOSIS — N2581 Secondary hyperparathyroidism of renal origin: Secondary | ICD-10-CM | POA: Diagnosis not present

## 2018-04-13 DIAGNOSIS — Z992 Dependence on renal dialysis: Secondary | ICD-10-CM | POA: Diagnosis not present

## 2018-04-13 MED FILL — MONTELUKAST SOD 10 MG TAB: 10 | 30 days supply | Qty: 30 | Fill #0

## 2018-04-13 NOTE — Telephone Encounter (Signed)
Medical records release form has been faxed to [facility] to request recent labs for CBC and Renal Panel per Dr. Nelva Bush. Form has been labeled and placed in bulk scanning.

## 2018-04-14 ENCOUNTER — Ambulatory Visit: Payer: Medicare Other | Admitting: Neurology

## 2018-04-14 DIAGNOSIS — D509 Iron deficiency anemia, unspecified: Secondary | ICD-10-CM | POA: Diagnosis not present

## 2018-04-14 DIAGNOSIS — N2581 Secondary hyperparathyroidism of renal origin: Secondary | ICD-10-CM | POA: Diagnosis not present

## 2018-04-14 DIAGNOSIS — D631 Anemia in chronic kidney disease: Secondary | ICD-10-CM | POA: Diagnosis not present

## 2018-04-14 DIAGNOSIS — N186 End stage renal disease: Secondary | ICD-10-CM | POA: Diagnosis not present

## 2018-04-14 DIAGNOSIS — Z992 Dependence on renal dialysis: Secondary | ICD-10-CM | POA: Diagnosis not present

## 2018-04-14 MED FILL — METOCLOPRAMIDE 5 MG TABLET: 5 | 90 days supply | Qty: 360 | Fill #2

## 2018-04-14 MED FILL — CLOPIDOGREL 75 MG TABLET: 75 | 90 days supply | Qty: 90 | Fill #5

## 2018-04-15 DIAGNOSIS — Z992 Dependence on renal dialysis: Secondary | ICD-10-CM | POA: Diagnosis not present

## 2018-04-15 DIAGNOSIS — D509 Iron deficiency anemia, unspecified: Secondary | ICD-10-CM | POA: Diagnosis not present

## 2018-04-15 DIAGNOSIS — N186 End stage renal disease: Secondary | ICD-10-CM | POA: Diagnosis not present

## 2018-04-15 DIAGNOSIS — N2581 Secondary hyperparathyroidism of renal origin: Secondary | ICD-10-CM | POA: Diagnosis not present

## 2018-04-15 DIAGNOSIS — D631 Anemia in chronic kidney disease: Secondary | ICD-10-CM | POA: Diagnosis not present

## 2018-04-16 DIAGNOSIS — N2581 Secondary hyperparathyroidism of renal origin: Secondary | ICD-10-CM | POA: Diagnosis not present

## 2018-04-16 DIAGNOSIS — N186 End stage renal disease: Secondary | ICD-10-CM | POA: Diagnosis not present

## 2018-04-16 DIAGNOSIS — Z992 Dependence on renal dialysis: Secondary | ICD-10-CM | POA: Diagnosis not present

## 2018-04-16 DIAGNOSIS — D631 Anemia in chronic kidney disease: Secondary | ICD-10-CM | POA: Diagnosis not present

## 2018-04-16 DIAGNOSIS — D509 Iron deficiency anemia, unspecified: Secondary | ICD-10-CM | POA: Diagnosis not present

## 2018-04-17 DIAGNOSIS — N186 End stage renal disease: Secondary | ICD-10-CM | POA: Diagnosis not present

## 2018-04-17 DIAGNOSIS — D631 Anemia in chronic kidney disease: Secondary | ICD-10-CM | POA: Diagnosis not present

## 2018-04-17 DIAGNOSIS — Z992 Dependence on renal dialysis: Secondary | ICD-10-CM | POA: Diagnosis not present

## 2018-04-17 DIAGNOSIS — N2581 Secondary hyperparathyroidism of renal origin: Secondary | ICD-10-CM | POA: Diagnosis not present

## 2018-04-17 DIAGNOSIS — D509 Iron deficiency anemia, unspecified: Secondary | ICD-10-CM | POA: Diagnosis not present

## 2018-04-17 LAB — ALLERGENS W/TOTAL IGE AREA 2

## 2018-04-17 LAB — ALPHA-GAL PANEL
Alpha Gal IgE*: <0.1 kU/L (ref <0.10)
Beef (Bos spp) IgE: <0.1 kU/L (ref <0.35)
Class Interpretation: 0
Class Interpretation: 0
Class Interpretation: 0
Lamb/Mutton (Ovis spp) IgE: <0.1 kU/L (ref <0.35)
Pork (Sus spp) IgE: <0.1 kU/L (ref <0.35)

## 2018-04-17 LAB — TSH: TSH: 2.87 u[IU]/mL (ref 0.450–4.500)

## 2018-04-18 DIAGNOSIS — D631 Anemia in chronic kidney disease: Secondary | ICD-10-CM | POA: Diagnosis not present

## 2018-04-18 DIAGNOSIS — N186 End stage renal disease: Secondary | ICD-10-CM | POA: Diagnosis not present

## 2018-04-18 DIAGNOSIS — N2581 Secondary hyperparathyroidism of renal origin: Secondary | ICD-10-CM | POA: Diagnosis not present

## 2018-04-18 DIAGNOSIS — Z992 Dependence on renal dialysis: Secondary | ICD-10-CM | POA: Diagnosis not present

## 2018-04-18 DIAGNOSIS — D509 Iron deficiency anemia, unspecified: Secondary | ICD-10-CM | POA: Diagnosis not present

## 2018-04-19 ENCOUNTER — Telehealth: Payer: Self-pay | Admitting: Pharmacist Clinician (PhC)/ Clinical Pharmacy Specialist

## 2018-04-19 DIAGNOSIS — D631 Anemia in chronic kidney disease: Secondary | ICD-10-CM | POA: Diagnosis not present

## 2018-04-19 DIAGNOSIS — Z992 Dependence on renal dialysis: Secondary | ICD-10-CM | POA: Diagnosis not present

## 2018-04-19 DIAGNOSIS — N2581 Secondary hyperparathyroidism of renal origin: Secondary | ICD-10-CM | POA: Diagnosis not present

## 2018-04-19 DIAGNOSIS — D509 Iron deficiency anemia, unspecified: Secondary | ICD-10-CM | POA: Diagnosis not present

## 2018-04-19 DIAGNOSIS — N186 End stage renal disease: Secondary | ICD-10-CM | POA: Diagnosis not present

## 2018-04-19 NOTE — Telephone Encounter (Signed)
Spoke with patient regarding Reaptha itch.  She states much better now, but not sure if it came from [place].  Still has a dose at home but reluctant to use.  Is willing to try Praluent 75 mg to see if she can tolerate this.  Will leave samples in [facility] refrigerator, asked that she call after 2 doses to let us know how she is doing.  Patient voiced understanding

## 2018-04-20 DIAGNOSIS — D509 Iron deficiency anemia, unspecified: Secondary | ICD-10-CM | POA: Diagnosis not present

## 2018-04-20 DIAGNOSIS — D631 Anemia in chronic kidney disease: Secondary | ICD-10-CM | POA: Diagnosis not present

## 2018-04-20 DIAGNOSIS — N186 End stage renal disease: Secondary | ICD-10-CM | POA: Diagnosis not present

## 2018-04-20 DIAGNOSIS — Z992 Dependence on renal dialysis: Secondary | ICD-10-CM | POA: Diagnosis not present

## 2018-04-20 DIAGNOSIS — N2581 Secondary hyperparathyroidism of renal origin: Secondary | ICD-10-CM | POA: Diagnosis not present

## 2018-04-21 DIAGNOSIS — N2581 Secondary hyperparathyroidism of renal origin: Secondary | ICD-10-CM | POA: Diagnosis not present

## 2018-04-21 DIAGNOSIS — D631 Anemia in chronic kidney disease: Secondary | ICD-10-CM | POA: Diagnosis not present

## 2018-04-21 DIAGNOSIS — D509 Iron deficiency anemia, unspecified: Secondary | ICD-10-CM | POA: Diagnosis not present

## 2018-04-21 DIAGNOSIS — Z992 Dependence on renal dialysis: Secondary | ICD-10-CM | POA: Diagnosis not present

## 2018-04-21 DIAGNOSIS — N186 End stage renal disease: Secondary | ICD-10-CM | POA: Diagnosis not present

## 2018-04-22 DIAGNOSIS — N186 End stage renal disease: Secondary | ICD-10-CM | POA: Diagnosis not present

## 2018-04-22 DIAGNOSIS — D509 Iron deficiency anemia, unspecified: Secondary | ICD-10-CM | POA: Diagnosis not present

## 2018-04-22 DIAGNOSIS — Z992 Dependence on renal dialysis: Secondary | ICD-10-CM | POA: Diagnosis not present

## 2018-04-22 DIAGNOSIS — D631 Anemia in chronic kidney disease: Secondary | ICD-10-CM | POA: Diagnosis not present

## 2018-04-22 DIAGNOSIS — N2581 Secondary hyperparathyroidism of renal origin: Secondary | ICD-10-CM | POA: Diagnosis not present

## 2018-04-23 DIAGNOSIS — D631 Anemia in chronic kidney disease: Secondary | ICD-10-CM | POA: Diagnosis not present

## 2018-04-23 DIAGNOSIS — D509 Iron deficiency anemia, unspecified: Secondary | ICD-10-CM | POA: Diagnosis not present

## 2018-04-23 DIAGNOSIS — N186 End stage renal disease: Secondary | ICD-10-CM | POA: Diagnosis not present

## 2018-04-23 DIAGNOSIS — Z992 Dependence on renal dialysis: Secondary | ICD-10-CM | POA: Diagnosis not present

## 2018-04-23 DIAGNOSIS — N2581 Secondary hyperparathyroidism of renal origin: Secondary | ICD-10-CM | POA: Diagnosis not present

## 2018-04-24 DIAGNOSIS — D509 Iron deficiency anemia, unspecified: Secondary | ICD-10-CM | POA: Diagnosis not present

## 2018-04-24 DIAGNOSIS — D631 Anemia in chronic kidney disease: Secondary | ICD-10-CM | POA: Diagnosis not present

## 2018-04-24 DIAGNOSIS — N2581 Secondary hyperparathyroidism of renal origin: Secondary | ICD-10-CM | POA: Diagnosis not present

## 2018-04-24 DIAGNOSIS — Z992 Dependence on renal dialysis: Secondary | ICD-10-CM | POA: Diagnosis not present

## 2018-04-24 DIAGNOSIS — N186 End stage renal disease: Secondary | ICD-10-CM | POA: Diagnosis not present

## 2018-04-25 DIAGNOSIS — D631 Anemia in chronic kidney disease: Secondary | ICD-10-CM | POA: Diagnosis not present

## 2018-04-25 DIAGNOSIS — N2581 Secondary hyperparathyroidism of renal origin: Secondary | ICD-10-CM | POA: Diagnosis not present

## 2018-04-25 DIAGNOSIS — D509 Iron deficiency anemia, unspecified: Secondary | ICD-10-CM | POA: Diagnosis not present

## 2018-04-25 DIAGNOSIS — Z992 Dependence on renal dialysis: Secondary | ICD-10-CM | POA: Diagnosis not present

## 2018-04-25 DIAGNOSIS — N186 End stage renal disease: Secondary | ICD-10-CM | POA: Diagnosis not present

## 2018-04-26 DIAGNOSIS — D631 Anemia in chronic kidney disease: Secondary | ICD-10-CM | POA: Diagnosis not present

## 2018-04-26 DIAGNOSIS — N186 End stage renal disease: Secondary | ICD-10-CM | POA: Diagnosis not present

## 2018-04-26 DIAGNOSIS — N2581 Secondary hyperparathyroidism of renal origin: Secondary | ICD-10-CM | POA: Diagnosis not present

## 2018-04-26 DIAGNOSIS — Z992 Dependence on renal dialysis: Secondary | ICD-10-CM | POA: Diagnosis not present

## 2018-04-26 DIAGNOSIS — D509 Iron deficiency anemia, unspecified: Secondary | ICD-10-CM | POA: Diagnosis not present

## 2018-04-27 DIAGNOSIS — N2581 Secondary hyperparathyroidism of renal origin: Secondary | ICD-10-CM | POA: Diagnosis not present

## 2018-04-27 DIAGNOSIS — D631 Anemia in chronic kidney disease: Secondary | ICD-10-CM | POA: Diagnosis not present

## 2018-04-27 DIAGNOSIS — N186 End stage renal disease: Secondary | ICD-10-CM | POA: Diagnosis not present

## 2018-04-27 DIAGNOSIS — Z992 Dependence on renal dialysis: Secondary | ICD-10-CM | POA: Diagnosis not present

## 2018-04-27 DIAGNOSIS — D509 Iron deficiency anemia, unspecified: Secondary | ICD-10-CM | POA: Diagnosis not present

## 2018-04-28 DIAGNOSIS — D509 Iron deficiency anemia, unspecified: Secondary | ICD-10-CM | POA: Diagnosis not present

## 2018-04-28 DIAGNOSIS — D631 Anemia in chronic kidney disease: Secondary | ICD-10-CM | POA: Diagnosis not present

## 2018-04-28 DIAGNOSIS — N186 End stage renal disease: Secondary | ICD-10-CM | POA: Diagnosis not present

## 2018-04-28 DIAGNOSIS — Z992 Dependence on renal dialysis: Secondary | ICD-10-CM | POA: Diagnosis not present

## 2018-04-28 DIAGNOSIS — N2581 Secondary hyperparathyroidism of renal origin: Secondary | ICD-10-CM | POA: Diagnosis not present

## 2018-04-29 DIAGNOSIS — Z992 Dependence on renal dialysis: Secondary | ICD-10-CM | POA: Diagnosis not present

## 2018-04-29 DIAGNOSIS — D631 Anemia in chronic kidney disease: Secondary | ICD-10-CM | POA: Diagnosis not present

## 2018-04-29 DIAGNOSIS — N186 End stage renal disease: Secondary | ICD-10-CM | POA: Diagnosis not present

## 2018-04-29 DIAGNOSIS — N2581 Secondary hyperparathyroidism of renal origin: Secondary | ICD-10-CM | POA: Diagnosis not present

## 2018-04-29 DIAGNOSIS — D509 Iron deficiency anemia, unspecified: Secondary | ICD-10-CM | POA: Diagnosis not present

## 2018-04-30 DIAGNOSIS — N2581 Secondary hyperparathyroidism of renal origin: Secondary | ICD-10-CM | POA: Diagnosis not present

## 2018-04-30 DIAGNOSIS — D631 Anemia in chronic kidney disease: Secondary | ICD-10-CM | POA: Diagnosis not present

## 2018-04-30 DIAGNOSIS — D509 Iron deficiency anemia, unspecified: Secondary | ICD-10-CM | POA: Diagnosis not present

## 2018-04-30 DIAGNOSIS — Z992 Dependence on renal dialysis: Secondary | ICD-10-CM | POA: Diagnosis not present

## 2018-04-30 DIAGNOSIS — N186 End stage renal disease: Secondary | ICD-10-CM | POA: Diagnosis not present

## 2018-05-01 DIAGNOSIS — Z992 Dependence on renal dialysis: Secondary | ICD-10-CM | POA: Diagnosis not present

## 2018-05-01 DIAGNOSIS — D509 Iron deficiency anemia, unspecified: Secondary | ICD-10-CM | POA: Diagnosis not present

## 2018-05-01 DIAGNOSIS — N2581 Secondary hyperparathyroidism of renal origin: Secondary | ICD-10-CM | POA: Diagnosis not present

## 2018-05-01 DIAGNOSIS — D631 Anemia in chronic kidney disease: Secondary | ICD-10-CM | POA: Diagnosis not present

## 2018-05-01 DIAGNOSIS — N186 End stage renal disease: Secondary | ICD-10-CM | POA: Diagnosis not present

## 2018-05-02 DIAGNOSIS — D631 Anemia in chronic kidney disease: Secondary | ICD-10-CM | POA: Diagnosis not present

## 2018-05-02 DIAGNOSIS — Z992 Dependence on renal dialysis: Secondary | ICD-10-CM | POA: Diagnosis not present

## 2018-05-02 DIAGNOSIS — N186 End stage renal disease: Secondary | ICD-10-CM | POA: Diagnosis not present

## 2018-05-02 DIAGNOSIS — D509 Iron deficiency anemia, unspecified: Secondary | ICD-10-CM | POA: Diagnosis not present

## 2018-05-02 DIAGNOSIS — N2581 Secondary hyperparathyroidism of renal origin: Secondary | ICD-10-CM | POA: Diagnosis not present

## 2018-05-03 DIAGNOSIS — D509 Iron deficiency anemia, unspecified: Secondary | ICD-10-CM | POA: Diagnosis not present

## 2018-05-03 DIAGNOSIS — N2581 Secondary hyperparathyroidism of renal origin: Secondary | ICD-10-CM | POA: Diagnosis not present

## 2018-05-03 DIAGNOSIS — D631 Anemia in chronic kidney disease: Secondary | ICD-10-CM | POA: Diagnosis not present

## 2018-05-03 DIAGNOSIS — Z992 Dependence on renal dialysis: Secondary | ICD-10-CM | POA: Diagnosis not present

## 2018-05-03 DIAGNOSIS — N186 End stage renal disease: Secondary | ICD-10-CM | POA: Diagnosis not present

## 2018-05-04 DIAGNOSIS — N186 End stage renal disease: Secondary | ICD-10-CM | POA: Diagnosis not present

## 2018-05-04 DIAGNOSIS — D631 Anemia in chronic kidney disease: Secondary | ICD-10-CM | POA: Diagnosis not present

## 2018-05-04 DIAGNOSIS — D509 Iron deficiency anemia, unspecified: Secondary | ICD-10-CM | POA: Diagnosis not present

## 2018-05-04 DIAGNOSIS — N2581 Secondary hyperparathyroidism of renal origin: Secondary | ICD-10-CM | POA: Diagnosis not present

## 2018-05-04 DIAGNOSIS — Z992 Dependence on renal dialysis: Secondary | ICD-10-CM | POA: Diagnosis not present

## 2018-05-05 DIAGNOSIS — D509 Iron deficiency anemia, unspecified: Secondary | ICD-10-CM | POA: Diagnosis not present

## 2018-05-05 DIAGNOSIS — N2581 Secondary hyperparathyroidism of renal origin: Secondary | ICD-10-CM | POA: Diagnosis not present

## 2018-05-05 DIAGNOSIS — D631 Anemia in chronic kidney disease: Secondary | ICD-10-CM | POA: Diagnosis not present

## 2018-05-05 DIAGNOSIS — N186 End stage renal disease: Secondary | ICD-10-CM | POA: Diagnosis not present

## 2018-05-05 DIAGNOSIS — Z992 Dependence on renal dialysis: Secondary | ICD-10-CM | POA: Diagnosis not present

## 2018-05-06 DIAGNOSIS — Z992 Dependence on renal dialysis: Secondary | ICD-10-CM | POA: Diagnosis not present

## 2018-05-06 DIAGNOSIS — N186 End stage renal disease: Secondary | ICD-10-CM | POA: Diagnosis not present

## 2018-05-06 DIAGNOSIS — D509 Iron deficiency anemia, unspecified: Secondary | ICD-10-CM | POA: Diagnosis not present

## 2018-05-06 DIAGNOSIS — N2581 Secondary hyperparathyroidism of renal origin: Secondary | ICD-10-CM | POA: Diagnosis not present

## 2018-05-06 DIAGNOSIS — D631 Anemia in chronic kidney disease: Secondary | ICD-10-CM | POA: Diagnosis not present

## 2018-05-07 DIAGNOSIS — Z992 Dependence on renal dialysis: Secondary | ICD-10-CM | POA: Diagnosis not present

## 2018-05-07 DIAGNOSIS — N186 End stage renal disease: Secondary | ICD-10-CM | POA: Diagnosis not present

## 2018-05-07 DIAGNOSIS — N2581 Secondary hyperparathyroidism of renal origin: Secondary | ICD-10-CM | POA: Diagnosis not present

## 2018-05-07 DIAGNOSIS — D631 Anemia in chronic kidney disease: Secondary | ICD-10-CM | POA: Diagnosis not present

## 2018-05-07 DIAGNOSIS — D509 Iron deficiency anemia, unspecified: Secondary | ICD-10-CM | POA: Diagnosis not present

## 2018-05-08 ENCOUNTER — Ambulatory Visit: Payer: Medicare Other

## 2018-05-08 DIAGNOSIS — D631 Anemia in chronic kidney disease: Secondary | ICD-10-CM | POA: Diagnosis not present

## 2018-05-08 DIAGNOSIS — N2581 Secondary hyperparathyroidism of renal origin: Secondary | ICD-10-CM | POA: Diagnosis not present

## 2018-05-08 DIAGNOSIS — Z992 Dependence on renal dialysis: Secondary | ICD-10-CM | POA: Diagnosis not present

## 2018-05-08 DIAGNOSIS — N186 End stage renal disease: Secondary | ICD-10-CM | POA: Diagnosis not present

## 2018-05-08 DIAGNOSIS — D509 Iron deficiency anemia, unspecified: Secondary | ICD-10-CM | POA: Diagnosis not present

## 2018-05-09 DIAGNOSIS — Z992 Dependence on renal dialysis: Secondary | ICD-10-CM | POA: Diagnosis not present

## 2018-05-09 DIAGNOSIS — D509 Iron deficiency anemia, unspecified: Secondary | ICD-10-CM | POA: Diagnosis not present

## 2018-05-09 DIAGNOSIS — N2581 Secondary hyperparathyroidism of renal origin: Secondary | ICD-10-CM | POA: Diagnosis not present

## 2018-05-09 DIAGNOSIS — D631 Anemia in chronic kidney disease: Secondary | ICD-10-CM | POA: Diagnosis not present

## 2018-05-09 DIAGNOSIS — N186 End stage renal disease: Secondary | ICD-10-CM | POA: Diagnosis not present

## 2018-05-10 DIAGNOSIS — N186 End stage renal disease: Secondary | ICD-10-CM | POA: Diagnosis not present

## 2018-05-10 DIAGNOSIS — Z992 Dependence on renal dialysis: Secondary | ICD-10-CM | POA: Diagnosis not present

## 2018-05-10 DIAGNOSIS — D631 Anemia in chronic kidney disease: Secondary | ICD-10-CM | POA: Diagnosis not present

## 2018-05-10 DIAGNOSIS — D509 Iron deficiency anemia, unspecified: Secondary | ICD-10-CM | POA: Diagnosis not present

## 2018-05-10 DIAGNOSIS — N2581 Secondary hyperparathyroidism of renal origin: Secondary | ICD-10-CM | POA: Diagnosis not present

## 2018-05-11 DIAGNOSIS — D509 Iron deficiency anemia, unspecified: Secondary | ICD-10-CM | POA: Diagnosis not present

## 2018-05-11 DIAGNOSIS — N2581 Secondary hyperparathyroidism of renal origin: Secondary | ICD-10-CM | POA: Diagnosis not present

## 2018-05-11 DIAGNOSIS — D631 Anemia in chronic kidney disease: Secondary | ICD-10-CM | POA: Diagnosis not present

## 2018-05-11 DIAGNOSIS — N186 End stage renal disease: Secondary | ICD-10-CM | POA: Diagnosis not present

## 2018-05-11 DIAGNOSIS — Z992 Dependence on renal dialysis: Secondary | ICD-10-CM | POA: Diagnosis not present

## 2018-05-12 DIAGNOSIS — E1129 Type 2 diabetes mellitus with other diabetic kidney complication: Secondary | ICD-10-CM | POA: Diagnosis not present

## 2018-05-12 DIAGNOSIS — N186 End stage renal disease: Secondary | ICD-10-CM | POA: Diagnosis not present

## 2018-05-12 DIAGNOSIS — D631 Anemia in chronic kidney disease: Secondary | ICD-10-CM | POA: Diagnosis not present

## 2018-05-12 DIAGNOSIS — Z992 Dependence on renal dialysis: Secondary | ICD-10-CM | POA: Diagnosis not present

## 2018-05-12 DIAGNOSIS — N2581 Secondary hyperparathyroidism of renal origin: Secondary | ICD-10-CM | POA: Diagnosis not present

## 2018-05-12 DIAGNOSIS — Z79899 Other long term (current) drug therapy: Secondary | ICD-10-CM | POA: Diagnosis not present

## 2018-05-12 DIAGNOSIS — D509 Iron deficiency anemia, unspecified: Secondary | ICD-10-CM | POA: Diagnosis not present

## 2018-05-12 DIAGNOSIS — E1122 Type 2 diabetes mellitus with diabetic chronic kidney disease: Secondary | ICD-10-CM | POA: Diagnosis not present

## 2018-05-13 DIAGNOSIS — N2581 Secondary hyperparathyroidism of renal origin: Secondary | ICD-10-CM | POA: Diagnosis not present

## 2018-05-13 DIAGNOSIS — D509 Iron deficiency anemia, unspecified: Secondary | ICD-10-CM | POA: Diagnosis not present

## 2018-05-13 DIAGNOSIS — D631 Anemia in chronic kidney disease: Secondary | ICD-10-CM | POA: Diagnosis not present

## 2018-05-13 DIAGNOSIS — Z79899 Other long term (current) drug therapy: Secondary | ICD-10-CM | POA: Diagnosis not present

## 2018-05-13 DIAGNOSIS — N186 End stage renal disease: Secondary | ICD-10-CM | POA: Diagnosis not present

## 2018-05-14 DIAGNOSIS — D631 Anemia in chronic kidney disease: Secondary | ICD-10-CM | POA: Diagnosis not present

## 2018-05-14 DIAGNOSIS — N186 End stage renal disease: Secondary | ICD-10-CM | POA: Diagnosis not present

## 2018-05-14 DIAGNOSIS — N2581 Secondary hyperparathyroidism of renal origin: Secondary | ICD-10-CM | POA: Diagnosis not present

## 2018-05-14 DIAGNOSIS — D509 Iron deficiency anemia, unspecified: Secondary | ICD-10-CM | POA: Diagnosis not present

## 2018-05-14 DIAGNOSIS — Z79899 Other long term (current) drug therapy: Secondary | ICD-10-CM | POA: Diagnosis not present

## 2018-05-15 DIAGNOSIS — N186 End stage renal disease: Secondary | ICD-10-CM | POA: Diagnosis not present

## 2018-05-15 DIAGNOSIS — D509 Iron deficiency anemia, unspecified: Secondary | ICD-10-CM | POA: Diagnosis not present

## 2018-05-15 DIAGNOSIS — R82998 Other abnormal findings in urine: Secondary | ICD-10-CM | POA: Diagnosis not present

## 2018-05-15 DIAGNOSIS — D631 Anemia in chronic kidney disease: Secondary | ICD-10-CM | POA: Diagnosis not present

## 2018-05-15 DIAGNOSIS — N2581 Secondary hyperparathyroidism of renal origin: Secondary | ICD-10-CM | POA: Diagnosis not present

## 2018-05-15 DIAGNOSIS — Z79899 Other long term (current) drug therapy: Secondary | ICD-10-CM | POA: Diagnosis not present

## 2018-05-16 DIAGNOSIS — N2581 Secondary hyperparathyroidism of renal origin: Secondary | ICD-10-CM | POA: Diagnosis not present

## 2018-05-16 DIAGNOSIS — D631 Anemia in chronic kidney disease: Secondary | ICD-10-CM | POA: Diagnosis not present

## 2018-05-16 DIAGNOSIS — Z79899 Other long term (current) drug therapy: Secondary | ICD-10-CM | POA: Diagnosis not present

## 2018-05-16 DIAGNOSIS — N186 End stage renal disease: Secondary | ICD-10-CM | POA: Diagnosis not present

## 2018-05-16 DIAGNOSIS — D509 Iron deficiency anemia, unspecified: Secondary | ICD-10-CM | POA: Diagnosis not present

## 2018-05-17 DIAGNOSIS — Z79899 Other long term (current) drug therapy: Secondary | ICD-10-CM | POA: Diagnosis not present

## 2018-05-17 DIAGNOSIS — D631 Anemia in chronic kidney disease: Secondary | ICD-10-CM | POA: Diagnosis not present

## 2018-05-17 DIAGNOSIS — N186 End stage renal disease: Secondary | ICD-10-CM | POA: Diagnosis not present

## 2018-05-17 DIAGNOSIS — D509 Iron deficiency anemia, unspecified: Secondary | ICD-10-CM | POA: Diagnosis not present

## 2018-05-17 DIAGNOSIS — N2581 Secondary hyperparathyroidism of renal origin: Secondary | ICD-10-CM | POA: Diagnosis not present

## 2018-05-18 DIAGNOSIS — D509 Iron deficiency anemia, unspecified: Secondary | ICD-10-CM | POA: Diagnosis not present

## 2018-05-18 DIAGNOSIS — D631 Anemia in chronic kidney disease: Secondary | ICD-10-CM | POA: Diagnosis not present

## 2018-05-18 DIAGNOSIS — N2581 Secondary hyperparathyroidism of renal origin: Secondary | ICD-10-CM | POA: Diagnosis not present

## 2018-05-18 DIAGNOSIS — N186 End stage renal disease: Secondary | ICD-10-CM | POA: Diagnosis not present

## 2018-05-18 DIAGNOSIS — Z79899 Other long term (current) drug therapy: Secondary | ICD-10-CM | POA: Diagnosis not present

## 2018-05-19 DIAGNOSIS — N2581 Secondary hyperparathyroidism of renal origin: Secondary | ICD-10-CM | POA: Diagnosis not present

## 2018-05-19 DIAGNOSIS — D509 Iron deficiency anemia, unspecified: Secondary | ICD-10-CM | POA: Diagnosis not present

## 2018-05-19 DIAGNOSIS — Z79899 Other long term (current) drug therapy: Secondary | ICD-10-CM | POA: Diagnosis not present

## 2018-05-19 DIAGNOSIS — D631 Anemia in chronic kidney disease: Secondary | ICD-10-CM | POA: Diagnosis not present

## 2018-05-19 DIAGNOSIS — N186 End stage renal disease: Secondary | ICD-10-CM | POA: Diagnosis not present

## 2018-05-20 DIAGNOSIS — D509 Iron deficiency anemia, unspecified: Secondary | ICD-10-CM | POA: Diagnosis not present

## 2018-05-20 DIAGNOSIS — D631 Anemia in chronic kidney disease: Secondary | ICD-10-CM | POA: Diagnosis not present

## 2018-05-20 DIAGNOSIS — N186 End stage renal disease: Secondary | ICD-10-CM | POA: Diagnosis not present

## 2018-05-20 DIAGNOSIS — Z79899 Other long term (current) drug therapy: Secondary | ICD-10-CM | POA: Diagnosis not present

## 2018-05-20 DIAGNOSIS — N2581 Secondary hyperparathyroidism of renal origin: Secondary | ICD-10-CM | POA: Diagnosis not present

## 2018-05-21 DIAGNOSIS — N186 End stage renal disease: Secondary | ICD-10-CM | POA: Diagnosis not present

## 2018-05-21 DIAGNOSIS — D631 Anemia in chronic kidney disease: Secondary | ICD-10-CM | POA: Diagnosis not present

## 2018-05-21 DIAGNOSIS — N2581 Secondary hyperparathyroidism of renal origin: Secondary | ICD-10-CM | POA: Diagnosis not present

## 2018-05-21 DIAGNOSIS — Z79899 Other long term (current) drug therapy: Secondary | ICD-10-CM | POA: Diagnosis not present

## 2018-05-21 DIAGNOSIS — D509 Iron deficiency anemia, unspecified: Secondary | ICD-10-CM | POA: Diagnosis not present

## 2018-05-22 ENCOUNTER — Ambulatory Visit (INDEPENDENT_AMBULATORY_CARE_PROVIDER_SITE_OTHER): Payer: Medicare Other | Admitting: Internal Medicine

## 2018-05-22 ENCOUNTER — Other Ambulatory Visit: Payer: Self-pay

## 2018-05-22 ENCOUNTER — Encounter: Payer: Self-pay | Admitting: Internal Medicine

## 2018-05-22 VITALS — BP 140/80 | HR 72 | Temp 98.7°F | Ht 65.5 in | Wt 186.0 lb

## 2018-05-22 DIAGNOSIS — N2581 Secondary hyperparathyroidism of renal origin: Secondary | ICD-10-CM | POA: Diagnosis not present

## 2018-05-22 DIAGNOSIS — E1165 Type 2 diabetes mellitus with hyperglycemia: Secondary | ICD-10-CM

## 2018-05-22 DIAGNOSIS — Z9861 Coronary angioplasty status: Secondary | ICD-10-CM | POA: Diagnosis not present

## 2018-05-22 DIAGNOSIS — E1142 Type 2 diabetes mellitus with diabetic polyneuropathy: Secondary | ICD-10-CM

## 2018-05-22 DIAGNOSIS — I2510 Atherosclerotic heart disease of native coronary artery without angina pectoris: Secondary | ICD-10-CM | POA: Diagnosis not present

## 2018-05-22 DIAGNOSIS — N186 End stage renal disease: Secondary | ICD-10-CM | POA: Diagnosis not present

## 2018-05-22 DIAGNOSIS — G629 Polyneuropathy, unspecified: Secondary | ICD-10-CM | POA: Diagnosis not present

## 2018-05-22 DIAGNOSIS — Z79899 Other long term (current) drug therapy: Secondary | ICD-10-CM | POA: Diagnosis not present

## 2018-05-22 DIAGNOSIS — D509 Iron deficiency anemia, unspecified: Secondary | ICD-10-CM | POA: Diagnosis not present

## 2018-05-22 DIAGNOSIS — E785 Hyperlipidemia, unspecified: Secondary | ICD-10-CM | POA: Diagnosis not present

## 2018-05-22 DIAGNOSIS — D631 Anemia in chronic kidney disease: Secondary | ICD-10-CM | POA: Diagnosis not present

## 2018-05-22 DIAGNOSIS — IMO0002 Reserved for concepts with insufficient information to code with codable children: Secondary | ICD-10-CM

## 2018-05-22 MED ORDER — INSULIN GLARGINE (1 UNIT DIAL) 300 UNIT/ML ~~LOC~~ SOPN: 25.0000 [IU] | PEN_INJECTOR | Freq: Two times a day (BID) | SUBCUTANEOUS | 3 refills | Status: DC

## 2018-05-22 MED ORDER — FREESTYLE LIBRE 14 DAY READER DEVI: 1.0000 | Freq: Once | 1 refills | Status: AC

## 2018-05-22 MED ORDER — FREESTYLE LIBRE 14 DAY SENSOR MISC: 1.0000 | 11 refills | Status: DC

## 2018-05-22 MED FILL — TOUJEO SOLOSTAR 300 UNITS/M: 300 | 27 days supply | Qty: 5 | Fill #0

## 2018-05-22 NOTE — Progress Notes (Signed)
Subjective:     Patient ID: Susan Horton, female   DOB: [birth year], 48 y.o.   MRN: [id number]  Diabetes    Susan Horton is a 48 y.o. woman, retired for f/u for DM (unclear if 1 or 2), dx in 1996 when pregnant with her daughter, insulin-dependent since 1999, uncontrolled, with complications (peripheral neuropathy, gastroparesis, DR, CKD).  Last visit 8 months ago. Now [place].  She had an allergic rxn to Repatha (itching) >> steroids >> sugars higher.  She has severe gastroparesis and had pacemaker placed.  She started hemodialysis (06/2017) -TTS at 12 PM >> peritoneal dialysis then.  She feels much better since she started.  Her sugars have improved afterwards and we decreased her insulin doses.  She is doing very well kidney transplant.  Last HbA1c: Lab Results  Component Value Date   HGBA1C 9.8 (A) 03/22/2018   HGBA1C 8.3 (H) 09/14/2017   HGBA1C 8.5 05/23/2017  07/24/2015: HbA1c 16%!  She was on: Insulin Before breakfast Before lunch Before dinner  Regular 20 20  25   NPH 40  30   + R insulin sliding scale as follows:  - 150- 165: + 1 unit  - 166- 180: + 2 units  - 181- 195: + 3 units  - 196- 210: + 4 units  - 211- 225: + 5 units  - 226- 240: + 6 units  - 241- 255: + 7 units  - 256- 270: + 8 units  - 271- 285: + 9 units  - > 286: + 10 units  Then on: - Toujeo 50 >> 60 units 2x a day - could not remember what dose taking!!!! - R insulin 40 units 3x a day after a meal >> 5-20 units ??? - could not remember what dose taking!!!!   - + Glipizide 10 mg 2x a day - started by PCP?  She is currently on: - Toujeo 60 units 2x a day >> 40 units in a.m. and 80 units at bedtime >> 25 units 2x a day - R insulin: (but actually takes 5-15 units): - 20 >> 25 >> 15 >> 15 units before a smaller meal  - 25 >> 30 >> 20 >> 25 units before a regular meal  She checks her sugars 3-4x a day: - am:   100s, 220-250 >> 87, 110-140,170 >> 128-130s, 160 - mid-morning: 90 (if skips b'fast) -  134 >> n/c - before lunch:200s >> 150 >> 150-170 >> 140-150s - before dinner: 200s >> 150-200 >> n/c >> 150-200 - at bedtime:  150-200 >> 120-130 >> 150-200 Lowest: 180 >> 120 >> 87 >> 113 Highest: 300s >> 400s >> 201 >> 300s  Meals: - Breakfast: toast + egg, fruit - Lunch: sandwich or fruit or most of the time:  fruit smoothie - Dinner: salad, shrimp, scallops, salmon - Snacks: fruit, smoothies, peanuts  -  + ESRD.  She sees nephrology (Dr. Jimmy Footman).  Now on hemodialysis Off losartan.  -+ HL; Latest cholesterol level: Lab Results  Component Value Date   CHOL 248 (H) 07/21/2017   HDL 33 (L) 07/21/2017   LDLCALC 165 (H) 07/21/2017   TRIG 248 (H) 07/21/2017   CHOLHDL 7.5 (H) 07/21/2017   07/24/2015: 251/733/25/117 On lovastatin.  - last eye exam: 05/2017: + DR, + macular edema.  She has a history of cataract surgery.  + Intraocular injections.  She had laser surgery in the right eye.  -+ Tingling and numbness in her >> sees podiatry, neurology,  now pain clinic.  She had multiple visits to the ED and admissions for hyperglycemia, gastroparesis exacerbation.  She had a cardiac stent 07/2016.  Review of Systems Constitutional: no weight gain/no weight loss, no fatigue, no subjective hyperthermia, no subjective hypothermia Eyes: no blurry vision, no xerophthalmia ENT: no sore throat, no nodules palpated in neck, no dysphagia, no odynophagia, no hoarseness Cardiovascular: no CP/no SOB/no palpitations/no leg swelling Respiratory: no cough/no SOB/no wheezing Gastrointestinal: no N/no V/no D/no C/no acid reflux Musculoskeletal: no muscle aches/no joint aches Skin: no rashes, no hair loss Neurological: no tremors/+ numbness/+ tingling/no dizziness  I reviewed pt's medications, allergies, PMH, social hx, family hx, and changes were documented in the history of present illness. Otherwise, unchanged from my initial visit note.  Past Medical History:  Diagnosis Date  . Anemia    . Angio-edema   . Asthma   . CAD (coronary artery disease)    DES to mid LAD July 2018, residual moderate RCA disease  . Cataract   . CKD (chronic kidney disease) stage 4, GFR 15-29 ml/min (HCC)    Dialysis T/Th/Sa  . DVT (deep venous thrombosis) ([place])    1996 during pregnancy, 2015 left leg  . Eczema   . Essential hypertension 12/11/2015  . Gastroparesis   . GERD (gastroesophageal reflux disease)   . Hidradenitis   . Migraine   . Neuropathy   . Peripheral vascular disease ([place])    blood clot in leg  . Type 2 diabetes mellitus ([place])   . Type II diabetes mellitus ([place])   . Urticaria    Past Surgical History:  Procedure Laterality Date  . A/V FISTULAGRAM N/A 06/22/2017   Procedure: A/V FISTULAGRAM - left arm;  Surgeon: [name], MD;  Location: [facility];  Service: Cardiovascular;  Laterality: N/A;  . ABDOMINOPLASTY    . ADENOIDECTOMY    . APPENDECTOMY  1995  . AV FISTULA PLACEMENT Left 02/01/2017   Procedure: ARTERIOVENOUS BRACHIOCEPHALIC (AV) FISTULA CREATION;  Surgeon: [name], MD;  Location: [facility];  Service: Vascular;  Laterality: Left;  . CHOLECYSTECTOMY  1993  . CORONARY ANGIOPLASTY WITH STENT PLACEMENT    . CORONARY STENT INTERVENTION N/A 08/05/2016   Procedure: Coronary Stent Intervention;  Surgeon: [name], MD;  Location: [facility];  Service: Cardiovascular;  Laterality: N/A;  . EXPLORATORY LAPAROTOMY  08/14/2005   lysis of adhesions, drainage of tubo-ovarian abscess  . FISTULA SUPERFICIALIZATION Left 09/14/2017   Procedure: FISTULA SUPERFICIALIZATION ARTERIOVENOUS FISTULA LEFT ARM;  Surgeon: [name], MD;  Location: [facility];  Service: Vascular;  Laterality: Left;  . HYDRADENITIS EXCISION  02/08/2011   Procedure: EXCISION HYDRADENITIS GROIN;  Surgeon: [name], MD;  Location: [facility];  Service: General;  Laterality: N/A;  Excisioin of Hidradenitis Left groin  . INGUINAL HIDRADENITIS  EXCISION  07/06/2010   bilateral  . INSERTION OF DIALYSIS CATHETER N/A 09/14/2017   Procedure: INSERTION OF TUNNELED DIALYSIS CATHETER Right Internal Jugular;  Surgeon: [name], MD;  Location: [facility];  Service: Vascular;  Laterality: N/A;  . LEFT HEART CATH AND CORONARY ANGIOGRAPHY N/A 08/05/2016   Procedure: Left Heart Cath and Coronary Angiography;  Surgeon: [name], MD;  Location: [facility];  Service: Cardiovascular;  Laterality: N/A;  . REDUCTION MAMMAPLASTY  2002  . TONSILLECTOMY    . TUBAL LIGATION  1996  . VAGINAL HYSTERECTOMY  08/04/2005   and cysto   Social History  Socioeconomic History  . Marital status: Single    Spouse name: Not on file  . Number of children: 3  . Years of education: 62  . Highest education level: Not on file  Occupational History  . Occupation: Curator  Social Needs  . Financial resource strain: Not on file  . Food insecurity:    Worry: Not on file    Inability: Not on file  . Transportation needs:    Medical: Not on file    Non-medical: Not on file  Tobacco Use  . Smoking status: Former Smoker    Years: 20.00    Types: Cigarettes    Last attempt to quit: 11/2016    Years since quitting: 1.5  . Smokeless tobacco: Never Used  Substance and Sexual Activity  . Alcohol use: No    Alcohol/week: 0.0 standard drinks  . Drug use: No  . Sexual activity: Not on file  Lifestyle  . Physical activity:    Days per week: Not on file    Minutes per session: Not on file  . Stress: Not on file  Relationships  . Social connections:    Talks on phone: Not on file    Gets together: Not on file    Attends religious service: Not on file    Active member of club or organization: Not on file    Attends meetings of clubs or organizations: Not on file    Relationship status: Not on file  . Intimate partner violence:    Fear of current or ex partner: Not on file    Emotionally abused: Not on file    Physically abused:  Not on file    Forced sexual activity: Not on file  Other Topics Concern  . Not on file  Social History Narrative   Lives with daughter in a 2 story home.  Has 3 children.  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  Education: college.    Current Outpatient Medications on File Prior to Visit  Medication Sig Dispense Refill  . albuterol (PROVENTIL HFA;VENTOLIN HFA) 108 (90 BASE) MCG/ACT inhaler Inhale 1-2 puffs into the lungs every 6 (six) hours as needed for wheezing or shortness of breath.     [name] aspirin 81 MG chewable tablet Chew 1 tablet (81 mg total) by mouth daily. 30 tablet 0  . calcium acetate (PHOSLO) 667 MG capsule TAKE 1 CAPSULE BY MOUTH THREE TIMES DAILY WITH MEALS AND 1 CAPSULE TWO TIMES DAILY WITH SNACKS  10  . capsicum (ZOSTRIX) 0.075 % topical cream Apply 1 application topically 2 (two) times daily. 28.3 g 0  . clopidogrel (PLAVIX) 75 MG tablet Take 1 tablet (75 mg total) by mouth daily with breakfast. 90 tablet 3  . Ferrous Sulfate (IRON) 325 (65 Fe) MG TABS Take 325 mg by mouth daily. 30 each 3  . fluconazole (DIFLUCAN) 150 MG tablet Take 1 tablet (150 mg total) by mouth daily. 2 tablet 0  . Fluticasone-Salmeterol (ADVAIR) 250-50 MCG/DOSE AEPB Inhale 1 puff into the lungs 2 (two) times daily. (Patient taking differently: Inhale 1 puff into the lungs daily as needed (shortness of breath). ) 60 each 3  . Insulin Glargine (TOUJEO SOLOSTAR) 300 UNIT/ML SOPN Inject 25 Units into the skin 2 (two) times daily. 45 mL 3  . insulin regular (HUMULIN R) 100 units/mL injection Inject 0.2-0.25 mLs (20-25 Units total) into the skin See admin instructions. 45 mL 3  . isosorbide mononitrate (IMDUR) 30 MG 24  hr tablet Take 0.5 tablets (15 mg total) by mouth daily. 45 tablet 3  . lidocaine (XYLOCAINE) 5 % ointment Apply 1 application topically as needed. 35.44 g 0  . lidocaine-prilocaine (EMLA) cream APPLY SMALL AMOUNT TO ACCESS SITE (AVF) 30 MINUTES BEFORE DIALYSIS. COVER WITH  OCCLUSIVE DRESSING (SARAN WRAP) THREE DAYS A WEEK (3 TIMES A  5  . lovastatin (MEVACOR) 20 MG tablet Take 20 mg by mouth at bedtime.     . methocarbamol (ROBAXIN) 500 MG tablet Take 1 tablet (500 mg total) by mouth 2 (two) times daily as needed for muscle spasms. 60 tablet 1  . metoCLOPramide (REGLAN) 5 MG tablet TAKE 1 TABLET BY MOUTH 4 TIMES DAILY BEFORE MEALS AND AT BEDTIME. (Patient taking differently: Take 5 mg by mouth 4 (four) times daily -  before meals and at bedtime. ) 90 tablet 0  . metoprolol succinate (TOPROL-XL) 50 MG 24 hr tablet Take 1 tablet (50 mg total) by mouth daily. 90 tablet 3  . montelukast (SINGULAIR) 10 MG tablet Take 10 mg by mouth daily as needed (allergies).     . montelukast (SINGULAIR) 10 MG tablet Take 1 tablet (10 mg total) by mouth at bedtime. 30 tablet 5  . multivitamin (RENA-VIT) TABS tablet Take 1 tablet by mouth at bedtime.  0  . nitroGLYCERIN (NITROSTAT) 0.4 MG SL tablet Place 1 tablet (0.4 mg total) under the tongue every 5 (five) minutes as needed for chest pain. 25 tablet 2  . pantoprazole (PROTONIX) 40 MG tablet TAKE 1 TABLET BY MOUTH DAILY. 30 tablet 7  . senna-docusate (SENOKOT-S) 8.6-50 MG tablet Take 1 tablet by mouth 2 (two) times daily. (Patient taking differently: Take 1 tablet by mouth daily as needed for moderate constipation. )    . trimethoprim-polymyxin b (POLYTRIM) ophthalmic solution INSTILL ONE DROP INTO BOTH EYES 4 TIMES A DAY FOR 2 DAYS AFTER EACH MONTHLY EYE INJECTION  12   No current facility-administered medications on file prior to visit.    Allergies  Allergen Reactions  . Penicillins Anaphylaxis, Itching, Swelling, Rash and Other (See Comments)    Swelling of throat & whole mouth  Has patient had a PCN reaction causing immediate rash, facial/tongue/throat swelling, SOB or lightheadedness with hypotension: Yes Has patient had a PCN reaction causing severe rash involving mucus membranes or skin necrosis: No Has patient had a PCN  reaction that required hospitalization: No Has patient had a PCN reaction occurring within the last 10 years: No If all of the above answers are "NO", then may proceed with Cephalosporin use.  . Repatha [Evolocumab] Itching  . Lisinopril Cough   Family History  Problem Relation Age of Onset  . Allergic rhinitis Mother   . Hypertension Father   . Allergic rhinitis Father   . Hypertension Sister   . Allergic rhinitis Sister   . Hypertension Brother   . Allergic rhinitis Brother   . Breast cancer Cousin        x2    Objective:   Physical Exam BP 140/80   Pulse 72   Temp 98.7 F (37.1 C)   Ht 5' 5.5" (1.664 m)   Wt 186 lb (84.4 kg)   SpO2 98%   BMI 30.48 kg/m  Body mass index is 30.48 kg/m. Wt Readings from Last 3 Encounters:  05/22/18 186 lb (84.4 kg)  04/12/18 172 lb 6.4 oz (78.2 kg)  03/29/18 179 lb 4 oz (81.3 kg)   Constitutional: + weight gain (fluid)/no weight loss, no  fatigue, no subjective hyperthermia, no subjective hypothermia Eyes: no blurry vision, no xerophthalmia ENT: no sore throat, no nodules palpated in neck, no dysphagia, no odynophagia, no hoarseness Cardiovascular: no CP/no SOB/no palpitations/no leg swelling Respiratory: no cough/no SOB/no wheezing Gastrointestinal: no N/no V/no D/no C/no acid reflux Musculoskeletal: no muscle aches/no joint aches Skin: no rashes, no hair loss Neurological: no tremors/no numbness/no tingling/no dizziness  I reviewed pt's medications, allergies, PMH, social hx, family hx, and changes were documented in the history of present illness. Otherwise, unchanged from my initial visit note.  Past Medical History:  Diagnosis Date  . Anemia   . Angio-edema   . Asthma   . CAD (coronary artery disease)    DES to mid LAD July 2018, residual moderate RCA disease  . Cataract   . CKD (chronic kidney disease) stage 4, GFR 15-29 ml/min (HCC)    Dialysis T/Th/Sa  . DVT (deep venous thrombosis) ([place])    1996 during pregnancy,  2015 left leg  . Eczema   . Essential hypertension 12/11/2015  . Gastroparesis   . GERD (gastroesophageal reflux disease)   . Hidradenitis   . Migraine   . Neuropathy   . Peripheral vascular disease ([place])    blood clot in leg  . Type 2 diabetes mellitus ([place])   . Type II diabetes mellitus ([place])   . Urticaria    Past Surgical History:  Procedure Laterality Date  . A/V FISTULAGRAM N/A 06/22/2017   Procedure: A/V FISTULAGRAM - left arm;  Surgeon: [name], MD;  Location: [facility];  Service: Cardiovascular;  Laterality: N/A;  . ABDOMINOPLASTY    . ADENOIDECTOMY    . APPENDECTOMY  1995  . AV FISTULA PLACEMENT Left 02/01/2017   Procedure: ARTERIOVENOUS BRACHIOCEPHALIC (AV) FISTULA CREATION;  Surgeon: [name], MD;  Location: [facility];  Service: Vascular;  Laterality: Left;  . CHOLECYSTECTOMY  1993  . CORONARY ANGIOPLASTY WITH STENT PLACEMENT    . CORONARY STENT INTERVENTION N/A 08/05/2016   Procedure: Coronary Stent Intervention;  Surgeon: [name], MD;  Location: [facility];  Service: Cardiovascular;  Laterality: N/A;  . EXPLORATORY LAPAROTOMY  08/14/2005   lysis of adhesions, drainage of tubo-ovarian abscess  . FISTULA SUPERFICIALIZATION Left 09/14/2017   Procedure: FISTULA SUPERFICIALIZATION ARTERIOVENOUS FISTULA LEFT ARM;  Surgeon: [name], MD;  Location: [facility];  Service: Vascular;  Laterality: Left;  . HYDRADENITIS EXCISION  02/08/2011   Procedure: EXCISION HYDRADENITIS GROIN;  Surgeon: [name], MD;  Location: [facility];  Service: General;  Laterality: N/A;  Excisioin of Hidradenitis Left groin  . INGUINAL HIDRADENITIS EXCISION  07/06/2010   bilateral  . INSERTION OF DIALYSIS CATHETER N/A 09/14/2017   Procedure: INSERTION OF TUNNELED DIALYSIS CATHETER Right Internal Jugular;  Surgeon: [name], MD;  Location: [facility];  Service: Vascular;  Laterality: N/A;  . LEFT HEART CATH AND CORONARY  ANGIOGRAPHY N/A 08/05/2016   Procedure: Left Heart Cath and Coronary Angiography;  Surgeon: [name], MD;  Location: [facility];  Service: Cardiovascular;  Laterality: N/A;  . REDUCTION MAMMAPLASTY  2002  . TONSILLECTOMY    . TUBAL LIGATION  1996  . VAGINAL HYSTERECTOMY  08/04/2005   and cysto   Social History   Socioeconomic History  . Marital status: Single    Spouse name: Not on file  . Number of children: 3  . Years of education: 66  . Highest education level: Not on file  Occupational History  . Occupation: Curator  Social Needs  . Financial resource strain: Not on file  . Food insecurity:    Worry: Not on file    Inability: Not on file  . Transportation needs:    Medical: Not on file    Non-medical: Not on file  Tobacco Use  . Smoking status: Former Smoker    Years: 20.00    Types: Cigarettes    Last attempt to quit: 11/2016    Years since quitting: 1.5  . Smokeless tobacco: Never Used  Substance and Sexual Activity  . Alcohol use: No    Alcohol/week: 0.0 standard drinks  . Drug use: No  . Sexual activity: Not on file  Lifestyle  . Physical activity:    Days per week: Not on file    Minutes per session: Not on file  . Stress: Not on file  Relationships  . Social connections:    Talks on phone: Not on file    Gets together: Not on file    Attends religious service: Not on file    Active member of club or organization: Not on file    Attends meetings of clubs or organizations: Not on file    Relationship status: Not on file  . Intimate partner violence:    Fear of current or ex partner: Not on file    Emotionally abused: Not on file    Physically abused: Not on file    Forced sexual activity: Not on file  Other Topics Concern  . Not on file  Social History Narrative   Lives with daughter in a 2 story home.  Has 3 children.  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  Education: college.    Current  Outpatient Medications on File Prior to Visit  Medication Sig Dispense Refill  . albuterol (PROVENTIL HFA;VENTOLIN HFA) 108 (90 BASE) MCG/ACT inhaler Inhale 1-2 puffs into the lungs every 6 (six) hours as needed for wheezing or shortness of breath.     [name] aspirin 81 MG chewable tablet Chew 1 tablet (81 mg total) by mouth daily. 30 tablet 0  . calcium acetate (PHOSLO) 667 MG capsule TAKE 1 CAPSULE BY MOUTH THREE TIMES DAILY WITH MEALS AND 1 CAPSULE TWO TIMES DAILY WITH SNACKS  10  . capsicum (ZOSTRIX) 0.075 % topical cream Apply 1 application topically 2 (two) times daily. 28.3 g 0  . clopidogrel (PLAVIX) 75 MG tablet Take 1 tablet (75 mg total) by mouth daily with breakfast. 90 tablet 3  . Ferrous Sulfate (IRON) 325 (65 Fe) MG TABS Take 325 mg by mouth daily. 30 each 3  . fluconazole (DIFLUCAN) 150 MG tablet Take 1 tablet (150 mg total) by mouth daily. 2 tablet 0  . Fluticasone-Salmeterol (ADVAIR) 250-50 MCG/DOSE AEPB Inhale 1 puff into the lungs 2 (two) times daily. (Patient taking differently: Inhale 1 puff into the lungs daily as needed (shortness of breath). ) 60 each 3  . insulin regular (HUMULIN R) 100 units/mL injection Inject 0.2-0.25 mLs (20-25 Units total) into the skin See admin instructions. 45 mL 3  . lidocaine (XYLOCAINE) 5 % ointment Apply 1 application topically as needed. 35.44 g 0  . lidocaine-prilocaine (EMLA) cream APPLY SMALL AMOUNT TO ACCESS SITE (AVF) 30 MINUTES BEFORE DIALYSIS. COVER WITH OCCLUSIVE DRESSING (SARAN WRAP) THREE DAYS A WEEK (3 TIMES A  5  . lovastatin (MEVACOR) 20 MG tablet Take 20 mg by mouth at bedtime.     [name]  methocarbamol (ROBAXIN) 500 MG tablet Take 1 tablet (500 mg total) by mouth 2 (two) times daily as needed for muscle spasms. 60 tablet 1  . metoCLOPramide (REGLAN) 5 MG tablet TAKE 1 TABLET BY MOUTH 4 TIMES DAILY BEFORE MEALS AND AT BEDTIME. (Patient taking differently: Take 5 mg by mouth 4 (four) times daily -  before meals and at bedtime. ) 90 tablet 0  .  metoprolol succinate (TOPROL-XL) 50 MG 24 hr tablet Take 1 tablet (50 mg total) by mouth daily. 90 tablet 3  . montelukast (SINGULAIR) 10 MG tablet Take 10 mg by mouth daily as needed (allergies).     . montelukast (SINGULAIR) 10 MG tablet Take 1 tablet (10 mg total) by mouth at bedtime. 30 tablet 5  . multivitamin (RENA-VIT) TABS tablet Take 1 tablet by mouth at bedtime.  0  . nitroGLYCERIN (NITROSTAT) 0.4 MG SL tablet Place 1 tablet (0.4 mg total) under the tongue every 5 (five) minutes as needed for chest pain. 25 tablet 2  . pantoprazole (PROTONIX) 40 MG tablet TAKE 1 TABLET BY MOUTH DAILY. 30 tablet 7  . senna-docusate (SENOKOT-S) 8.6-50 MG tablet Take 1 tablet by mouth 2 (two) times daily. (Patient taking differently: Take 1 tablet by mouth daily as needed for moderate constipation. )    . trimethoprim-polymyxin b (POLYTRIM) ophthalmic solution INSTILL ONE DROP INTO BOTH EYES 4 TIMES A DAY FOR 2 DAYS AFTER EACH MONTHLY EYE INJECTION  12  . isosorbide mononitrate (IMDUR) 30 MG 24 hr tablet Take 0.5 tablets (15 mg total) by mouth daily. 45 tablet 3   No current facility-administered medications on file prior to visit.    Allergies  Allergen Reactions  . Penicillins Anaphylaxis, Itching, Swelling, Rash and Other (See Comments)    Swelling of throat & whole mouth  Has patient had a PCN reaction causing immediate rash, facial/tongue/throat swelling, SOB or lightheadedness with hypotension: Yes Has patient had a PCN reaction causing severe rash involving mucus membranes or skin necrosis: No Has patient had a PCN reaction that required hospitalization: No Has patient had a PCN reaction occurring within the last 10 years: No If all of the above answers are "NO", then may proceed with Cephalosporin use.  . Repatha [Evolocumab] Itching  . Lisinopril Cough   Family History  Problem Relation Age of Onset  . Allergic rhinitis Mother   . Hypertension Father   . Allergic rhinitis Father   .  Hypertension Sister   . Allergic rhinitis Sister   . Hypertension Brother   . Allergic rhinitis Brother   . Breast cancer Cousin        x2      Assessment:     1. DM - unclear If type I or type II, insulin-dependent, uncontrolled, with complications - CKD - peripheral neuropathy - DR - gastroparesis 10/06/2005: GES: Clinical Data: 48 year-old, gastroparesis.  NUCLEAR MEDICINE GASTRIC EMPTYING SCAN:  Technique: After oral ingestion of radiolabeled meal, sequential abdominal images were obtained for 120 minutes. Residual percentage of activity remaining within the stomach was calculated at 60 and 120 minutes.  Radiopharmaceutical: 2 mCi Tc-76m sulfur colloid in egg meal.  Findings: Anterior imaging was performed over the stomach over a 2-hour period. At 60 minutes, close to 100% of the activity remains in the stomach, although there is some activity in the small bowel. At 2 hours, 91% of the activity remained in the stomach.  IMPRESSION:  Marked delayed solid phase gastric emptying consistent with gastroparesis.  Patient  has been diagnosed with diabetes when she was 76, during her pregnancy, but it is unclear whether this is type I or type II. She has not have episodes of DKA, which would point more towards type 2 diabetes. I would like to check her C-peptide, however her sugars, and the C-peptide measurement is only valid if the glucose is lower than 200 at the time of the check.  Component     Latest Ref Rng 06/06/2014  Glutamic Acid Decarb Ab     0.0 - 5.0 U/mL <5.0  Pancreatic Islet Cell Antibody     Neg:<1:1 Negative   2. Diabetic peripheral neuropathy  3. HL  Plan:     1. DM - Pt with poorly controlled diabetes due to noncompliance with sugar checks, insulin doses, appointments, and also due to uncontrolled gastroparesis.  At last visit, she started to do a better job with appointments, but since then she was lost to follow-up for 8 months.  Her atest HbA1c was  higher, 9.8%, 2 months ago.  At last visit, she just started dialysis and sugar started to improve while her insulin doses were decreased.  However, sugars were still not at goal and we increased her R insulin with meals. -At this visit, however, upon questioning, she tells me that she is only taking 5 to 15 units before every meal.  She took up to 25 units when she was taking steroids, but not since then.  Subsequently, sugars are increasing throughout the day in a stepwise fashion.  We discussed that she definitely needs more insulin with meals and I advised her to use 15 to 25 units. - I advised her to: Patient Instructions  Please continue: - Toujeo 25 units twice a day  Please increase: - R insulin 15-25 units before meals  Please return in 3 months with your sugar log.    - continue checking sugars at different times of the day - check 2-3x a day, rotating checks - advised for yearly eye exams >> she is UTD - Return to clinic in 3 mo with sugar log      2. Diabetic peripheral neuropathy -Continues to see neurology and also pain clinic.  She started to use a TENS unit before our last visit -On Cymbalta and Neurontin, but still has breakthrough pain and numbness -She tried alpha-lipoic acid and B complex but these did not work.  She cannot use Lyrica due to CKD.  3. HL - Reviewed latest lipid panel from 07/2017: All fractions abnormal, with high LDL Lab Results  Component Value Date   CHOL 248 (H) 07/21/2017   HDL 33 (L) 07/21/2017   LDLCALC 165 (H) 07/21/2017   TRIG 248 (H) 07/21/2017   CHOLHDL 7.5 (H) 07/21/2017  - Continues lovastatin without side effects.  Philemon Kingdom, MD PhD [facility] Endocrinology

## 2018-05-22 NOTE — Patient Instructions (Addendum)
Please continue: - Toujeo 25 units twice a day  Please increase: - R insulin 15-25 units before meals  Please return in 3 months with your sugar log.

## 2018-05-23 DIAGNOSIS — Z79899 Other long term (current) drug therapy: Secondary | ICD-10-CM | POA: Diagnosis not present

## 2018-05-23 DIAGNOSIS — N2581 Secondary hyperparathyroidism of renal origin: Secondary | ICD-10-CM | POA: Diagnosis not present

## 2018-05-23 DIAGNOSIS — D631 Anemia in chronic kidney disease: Secondary | ICD-10-CM | POA: Diagnosis not present

## 2018-05-23 DIAGNOSIS — N186 End stage renal disease: Secondary | ICD-10-CM | POA: Diagnosis not present

## 2018-05-23 DIAGNOSIS — D509 Iron deficiency anemia, unspecified: Secondary | ICD-10-CM | POA: Diagnosis not present

## 2018-05-24 DIAGNOSIS — N2581 Secondary hyperparathyroidism of renal origin: Secondary | ICD-10-CM | POA: Diagnosis not present

## 2018-05-24 DIAGNOSIS — Z79899 Other long term (current) drug therapy: Secondary | ICD-10-CM | POA: Diagnosis not present

## 2018-05-24 DIAGNOSIS — D631 Anemia in chronic kidney disease: Secondary | ICD-10-CM | POA: Diagnosis not present

## 2018-05-24 DIAGNOSIS — D509 Iron deficiency anemia, unspecified: Secondary | ICD-10-CM | POA: Diagnosis not present

## 2018-05-24 DIAGNOSIS — N186 End stage renal disease: Secondary | ICD-10-CM | POA: Diagnosis not present

## 2018-05-25 DIAGNOSIS — Z79899 Other long term (current) drug therapy: Secondary | ICD-10-CM | POA: Diagnosis not present

## 2018-05-25 DIAGNOSIS — N2581 Secondary hyperparathyroidism of renal origin: Secondary | ICD-10-CM | POA: Diagnosis not present

## 2018-05-25 DIAGNOSIS — D509 Iron deficiency anemia, unspecified: Secondary | ICD-10-CM | POA: Diagnosis not present

## 2018-05-25 DIAGNOSIS — D631 Anemia in chronic kidney disease: Secondary | ICD-10-CM | POA: Diagnosis not present

## 2018-05-25 DIAGNOSIS — N186 End stage renal disease: Secondary | ICD-10-CM | POA: Diagnosis not present

## 2018-05-26 DIAGNOSIS — N2581 Secondary hyperparathyroidism of renal origin: Secondary | ICD-10-CM | POA: Diagnosis not present

## 2018-05-26 DIAGNOSIS — N186 End stage renal disease: Secondary | ICD-10-CM | POA: Diagnosis not present

## 2018-05-26 DIAGNOSIS — Z79899 Other long term (current) drug therapy: Secondary | ICD-10-CM | POA: Diagnosis not present

## 2018-05-26 DIAGNOSIS — D631 Anemia in chronic kidney disease: Secondary | ICD-10-CM | POA: Diagnosis not present

## 2018-05-26 DIAGNOSIS — D509 Iron deficiency anemia, unspecified: Secondary | ICD-10-CM | POA: Diagnosis not present

## 2018-05-27 DIAGNOSIS — Z79899 Other long term (current) drug therapy: Secondary | ICD-10-CM | POA: Diagnosis not present

## 2018-05-27 DIAGNOSIS — N2581 Secondary hyperparathyroidism of renal origin: Secondary | ICD-10-CM | POA: Diagnosis not present

## 2018-05-27 DIAGNOSIS — N186 End stage renal disease: Secondary | ICD-10-CM | POA: Diagnosis not present

## 2018-05-27 DIAGNOSIS — D631 Anemia in chronic kidney disease: Secondary | ICD-10-CM | POA: Diagnosis not present

## 2018-05-27 DIAGNOSIS — D509 Iron deficiency anemia, unspecified: Secondary | ICD-10-CM | POA: Diagnosis not present

## 2018-05-28 DIAGNOSIS — D631 Anemia in chronic kidney disease: Secondary | ICD-10-CM | POA: Diagnosis not present

## 2018-05-28 DIAGNOSIS — N2581 Secondary hyperparathyroidism of renal origin: Secondary | ICD-10-CM | POA: Diagnosis not present

## 2018-05-28 DIAGNOSIS — N186 End stage renal disease: Secondary | ICD-10-CM | POA: Diagnosis not present

## 2018-05-28 DIAGNOSIS — D509 Iron deficiency anemia, unspecified: Secondary | ICD-10-CM | POA: Diagnosis not present

## 2018-05-28 DIAGNOSIS — Z79899 Other long term (current) drug therapy: Secondary | ICD-10-CM | POA: Diagnosis not present

## 2018-05-29 DIAGNOSIS — N186 End stage renal disease: Secondary | ICD-10-CM | POA: Diagnosis not present

## 2018-05-29 DIAGNOSIS — D631 Anemia in chronic kidney disease: Secondary | ICD-10-CM | POA: Diagnosis not present

## 2018-05-29 DIAGNOSIS — D509 Iron deficiency anemia, unspecified: Secondary | ICD-10-CM | POA: Diagnosis not present

## 2018-05-29 DIAGNOSIS — Z79899 Other long term (current) drug therapy: Secondary | ICD-10-CM | POA: Diagnosis not present

## 2018-05-29 DIAGNOSIS — N2581 Secondary hyperparathyroidism of renal origin: Secondary | ICD-10-CM | POA: Diagnosis not present

## 2018-05-30 DIAGNOSIS — D509 Iron deficiency anemia, unspecified: Secondary | ICD-10-CM | POA: Diagnosis not present

## 2018-05-30 DIAGNOSIS — Z79899 Other long term (current) drug therapy: Secondary | ICD-10-CM | POA: Diagnosis not present

## 2018-05-30 DIAGNOSIS — N186 End stage renal disease: Secondary | ICD-10-CM | POA: Diagnosis not present

## 2018-05-30 DIAGNOSIS — N2581 Secondary hyperparathyroidism of renal origin: Secondary | ICD-10-CM | POA: Diagnosis not present

## 2018-05-30 DIAGNOSIS — D631 Anemia in chronic kidney disease: Secondary | ICD-10-CM | POA: Diagnosis not present

## 2018-05-31 DIAGNOSIS — N186 End stage renal disease: Secondary | ICD-10-CM | POA: Diagnosis not present

## 2018-05-31 DIAGNOSIS — D509 Iron deficiency anemia, unspecified: Secondary | ICD-10-CM | POA: Diagnosis not present

## 2018-05-31 DIAGNOSIS — N2581 Secondary hyperparathyroidism of renal origin: Secondary | ICD-10-CM | POA: Diagnosis not present

## 2018-05-31 DIAGNOSIS — Z79899 Other long term (current) drug therapy: Secondary | ICD-10-CM | POA: Diagnosis not present

## 2018-05-31 DIAGNOSIS — D631 Anemia in chronic kidney disease: Secondary | ICD-10-CM | POA: Diagnosis not present

## 2018-06-01 DIAGNOSIS — Z79899 Other long term (current) drug therapy: Secondary | ICD-10-CM | POA: Diagnosis not present

## 2018-06-01 DIAGNOSIS — D631 Anemia in chronic kidney disease: Secondary | ICD-10-CM | POA: Diagnosis not present

## 2018-06-01 DIAGNOSIS — N2581 Secondary hyperparathyroidism of renal origin: Secondary | ICD-10-CM | POA: Diagnosis not present

## 2018-06-01 DIAGNOSIS — D509 Iron deficiency anemia, unspecified: Secondary | ICD-10-CM | POA: Diagnosis not present

## 2018-06-01 DIAGNOSIS — N186 End stage renal disease: Secondary | ICD-10-CM | POA: Diagnosis not present

## 2018-06-02 DIAGNOSIS — Z79899 Other long term (current) drug therapy: Secondary | ICD-10-CM | POA: Diagnosis not present

## 2018-06-02 DIAGNOSIS — D509 Iron deficiency anemia, unspecified: Secondary | ICD-10-CM | POA: Diagnosis not present

## 2018-06-02 DIAGNOSIS — N2581 Secondary hyperparathyroidism of renal origin: Secondary | ICD-10-CM | POA: Diagnosis not present

## 2018-06-02 DIAGNOSIS — N186 End stage renal disease: Secondary | ICD-10-CM | POA: Diagnosis not present

## 2018-06-02 DIAGNOSIS — D631 Anemia in chronic kidney disease: Secondary | ICD-10-CM | POA: Diagnosis not present

## 2018-06-03 DIAGNOSIS — Z79899 Other long term (current) drug therapy: Secondary | ICD-10-CM | POA: Diagnosis not present

## 2018-06-03 DIAGNOSIS — D631 Anemia in chronic kidney disease: Secondary | ICD-10-CM | POA: Diagnosis not present

## 2018-06-03 DIAGNOSIS — D509 Iron deficiency anemia, unspecified: Secondary | ICD-10-CM | POA: Diagnosis not present

## 2018-06-03 DIAGNOSIS — N186 End stage renal disease: Secondary | ICD-10-CM | POA: Diagnosis not present

## 2018-06-03 DIAGNOSIS — N2581 Secondary hyperparathyroidism of renal origin: Secondary | ICD-10-CM | POA: Diagnosis not present

## 2018-06-04 DIAGNOSIS — N186 End stage renal disease: Secondary | ICD-10-CM | POA: Diagnosis not present

## 2018-06-04 DIAGNOSIS — N2581 Secondary hyperparathyroidism of renal origin: Secondary | ICD-10-CM | POA: Diagnosis not present

## 2018-06-04 DIAGNOSIS — D631 Anemia in chronic kidney disease: Secondary | ICD-10-CM | POA: Diagnosis not present

## 2018-06-04 DIAGNOSIS — D509 Iron deficiency anemia, unspecified: Secondary | ICD-10-CM | POA: Diagnosis not present

## 2018-06-04 DIAGNOSIS — Z79899 Other long term (current) drug therapy: Secondary | ICD-10-CM | POA: Diagnosis not present

## 2018-06-05 DIAGNOSIS — N186 End stage renal disease: Secondary | ICD-10-CM | POA: Diagnosis not present

## 2018-06-05 DIAGNOSIS — D631 Anemia in chronic kidney disease: Secondary | ICD-10-CM | POA: Diagnosis not present

## 2018-06-05 DIAGNOSIS — N2581 Secondary hyperparathyroidism of renal origin: Secondary | ICD-10-CM | POA: Diagnosis not present

## 2018-06-05 DIAGNOSIS — Z79899 Other long term (current) drug therapy: Secondary | ICD-10-CM | POA: Diagnosis not present

## 2018-06-05 DIAGNOSIS — D509 Iron deficiency anemia, unspecified: Secondary | ICD-10-CM | POA: Diagnosis not present

## 2018-06-06 DIAGNOSIS — N2581 Secondary hyperparathyroidism of renal origin: Secondary | ICD-10-CM | POA: Diagnosis not present

## 2018-06-06 DIAGNOSIS — N186 End stage renal disease: Secondary | ICD-10-CM | POA: Diagnosis not present

## 2018-06-06 DIAGNOSIS — Z79899 Other long term (current) drug therapy: Secondary | ICD-10-CM | POA: Diagnosis not present

## 2018-06-06 DIAGNOSIS — D631 Anemia in chronic kidney disease: Secondary | ICD-10-CM | POA: Diagnosis not present

## 2018-06-06 DIAGNOSIS — D509 Iron deficiency anemia, unspecified: Secondary | ICD-10-CM | POA: Diagnosis not present

## 2018-06-07 DIAGNOSIS — Z79899 Other long term (current) drug therapy: Secondary | ICD-10-CM | POA: Diagnosis not present

## 2018-06-07 DIAGNOSIS — N2581 Secondary hyperparathyroidism of renal origin: Secondary | ICD-10-CM | POA: Diagnosis not present

## 2018-06-07 DIAGNOSIS — N186 End stage renal disease: Secondary | ICD-10-CM | POA: Diagnosis not present

## 2018-06-07 DIAGNOSIS — D631 Anemia in chronic kidney disease: Secondary | ICD-10-CM | POA: Diagnosis not present

## 2018-06-07 DIAGNOSIS — D509 Iron deficiency anemia, unspecified: Secondary | ICD-10-CM | POA: Diagnosis not present

## 2018-06-07 DIAGNOSIS — Z7682 Awaiting organ transplant status: Secondary | ICD-10-CM | POA: Diagnosis not present

## 2018-06-08 DIAGNOSIS — N2581 Secondary hyperparathyroidism of renal origin: Secondary | ICD-10-CM | POA: Diagnosis not present

## 2018-06-08 DIAGNOSIS — D509 Iron deficiency anemia, unspecified: Secondary | ICD-10-CM | POA: Diagnosis not present

## 2018-06-08 DIAGNOSIS — Z79899 Other long term (current) drug therapy: Secondary | ICD-10-CM | POA: Diagnosis not present

## 2018-06-08 DIAGNOSIS — N186 End stage renal disease: Secondary | ICD-10-CM | POA: Diagnosis not present

## 2018-06-08 DIAGNOSIS — D631 Anemia in chronic kidney disease: Secondary | ICD-10-CM | POA: Diagnosis not present

## 2018-06-09 DIAGNOSIS — N186 End stage renal disease: Secondary | ICD-10-CM | POA: Diagnosis not present

## 2018-06-09 DIAGNOSIS — D631 Anemia in chronic kidney disease: Secondary | ICD-10-CM | POA: Diagnosis not present

## 2018-06-09 DIAGNOSIS — N2581 Secondary hyperparathyroidism of renal origin: Secondary | ICD-10-CM | POA: Diagnosis not present

## 2018-06-09 DIAGNOSIS — Z79899 Other long term (current) drug therapy: Secondary | ICD-10-CM | POA: Diagnosis not present

## 2018-06-09 DIAGNOSIS — D509 Iron deficiency anemia, unspecified: Secondary | ICD-10-CM | POA: Diagnosis not present

## 2018-06-10 DIAGNOSIS — D631 Anemia in chronic kidney disease: Secondary | ICD-10-CM | POA: Diagnosis not present

## 2018-06-10 DIAGNOSIS — N2581 Secondary hyperparathyroidism of renal origin: Secondary | ICD-10-CM | POA: Diagnosis not present

## 2018-06-10 DIAGNOSIS — Z79899 Other long term (current) drug therapy: Secondary | ICD-10-CM | POA: Diagnosis not present

## 2018-06-10 DIAGNOSIS — D509 Iron deficiency anemia, unspecified: Secondary | ICD-10-CM | POA: Diagnosis not present

## 2018-06-10 DIAGNOSIS — N186 End stage renal disease: Secondary | ICD-10-CM | POA: Diagnosis not present

## 2018-06-11 DIAGNOSIS — N2581 Secondary hyperparathyroidism of renal origin: Secondary | ICD-10-CM | POA: Diagnosis not present

## 2018-06-11 DIAGNOSIS — D631 Anemia in chronic kidney disease: Secondary | ICD-10-CM | POA: Diagnosis not present

## 2018-06-11 DIAGNOSIS — N186 End stage renal disease: Secondary | ICD-10-CM | POA: Diagnosis not present

## 2018-06-11 DIAGNOSIS — Z79899 Other long term (current) drug therapy: Secondary | ICD-10-CM | POA: Diagnosis not present

## 2018-06-11 DIAGNOSIS — D509 Iron deficiency anemia, unspecified: Secondary | ICD-10-CM | POA: Diagnosis not present

## 2018-06-12 DIAGNOSIS — N186 End stage renal disease: Secondary | ICD-10-CM | POA: Diagnosis not present

## 2018-06-12 DIAGNOSIS — R82998 Other abnormal findings in urine: Secondary | ICD-10-CM | POA: Diagnosis not present

## 2018-06-12 DIAGNOSIS — N2581 Secondary hyperparathyroidism of renal origin: Secondary | ICD-10-CM | POA: Diagnosis not present

## 2018-06-12 DIAGNOSIS — Z79899 Other long term (current) drug therapy: Secondary | ICD-10-CM | POA: Diagnosis not present

## 2018-06-12 DIAGNOSIS — E1122 Type 2 diabetes mellitus with diabetic chronic kidney disease: Secondary | ICD-10-CM | POA: Diagnosis not present

## 2018-06-12 DIAGNOSIS — E1129 Type 2 diabetes mellitus with other diabetic kidney complication: Secondary | ICD-10-CM | POA: Diagnosis not present

## 2018-06-12 DIAGNOSIS — D509 Iron deficiency anemia, unspecified: Secondary | ICD-10-CM | POA: Diagnosis not present

## 2018-06-12 DIAGNOSIS — Z992 Dependence on renal dialysis: Secondary | ICD-10-CM | POA: Diagnosis not present

## 2018-06-12 DIAGNOSIS — D631 Anemia in chronic kidney disease: Secondary | ICD-10-CM | POA: Diagnosis not present

## 2018-06-13 DIAGNOSIS — N2581 Secondary hyperparathyroidism of renal origin: Secondary | ICD-10-CM | POA: Diagnosis not present

## 2018-06-13 DIAGNOSIS — D631 Anemia in chronic kidney disease: Secondary | ICD-10-CM | POA: Diagnosis not present

## 2018-06-13 DIAGNOSIS — Z79899 Other long term (current) drug therapy: Secondary | ICD-10-CM | POA: Diagnosis not present

## 2018-06-13 DIAGNOSIS — D509 Iron deficiency anemia, unspecified: Secondary | ICD-10-CM | POA: Diagnosis not present

## 2018-06-13 DIAGNOSIS — N186 End stage renal disease: Secondary | ICD-10-CM | POA: Diagnosis not present

## 2018-06-14 DIAGNOSIS — D509 Iron deficiency anemia, unspecified: Secondary | ICD-10-CM | POA: Diagnosis not present

## 2018-06-14 DIAGNOSIS — Z79899 Other long term (current) drug therapy: Secondary | ICD-10-CM | POA: Diagnosis not present

## 2018-06-14 DIAGNOSIS — N2581 Secondary hyperparathyroidism of renal origin: Secondary | ICD-10-CM | POA: Diagnosis not present

## 2018-06-14 DIAGNOSIS — N186 End stage renal disease: Secondary | ICD-10-CM | POA: Diagnosis not present

## 2018-06-14 DIAGNOSIS — D631 Anemia in chronic kidney disease: Secondary | ICD-10-CM | POA: Diagnosis not present

## 2018-06-15 DIAGNOSIS — D631 Anemia in chronic kidney disease: Secondary | ICD-10-CM | POA: Diagnosis not present

## 2018-06-15 DIAGNOSIS — N2581 Secondary hyperparathyroidism of renal origin: Secondary | ICD-10-CM | POA: Diagnosis not present

## 2018-06-15 DIAGNOSIS — D509 Iron deficiency anemia, unspecified: Secondary | ICD-10-CM | POA: Diagnosis not present

## 2018-06-15 DIAGNOSIS — N186 End stage renal disease: Secondary | ICD-10-CM | POA: Diagnosis not present

## 2018-06-15 DIAGNOSIS — Z79899 Other long term (current) drug therapy: Secondary | ICD-10-CM | POA: Diagnosis not present

## 2018-06-16 DIAGNOSIS — N186 End stage renal disease: Secondary | ICD-10-CM | POA: Diagnosis not present

## 2018-06-16 DIAGNOSIS — Z79899 Other long term (current) drug therapy: Secondary | ICD-10-CM | POA: Diagnosis not present

## 2018-06-16 DIAGNOSIS — N2581 Secondary hyperparathyroidism of renal origin: Secondary | ICD-10-CM | POA: Diagnosis not present

## 2018-06-16 DIAGNOSIS — D631 Anemia in chronic kidney disease: Secondary | ICD-10-CM | POA: Diagnosis not present

## 2018-06-16 DIAGNOSIS — D509 Iron deficiency anemia, unspecified: Secondary | ICD-10-CM | POA: Diagnosis not present

## 2018-06-17 DIAGNOSIS — D509 Iron deficiency anemia, unspecified: Secondary | ICD-10-CM | POA: Diagnosis not present

## 2018-06-17 DIAGNOSIS — Z79899 Other long term (current) drug therapy: Secondary | ICD-10-CM | POA: Diagnosis not present

## 2018-06-17 DIAGNOSIS — N2581 Secondary hyperparathyroidism of renal origin: Secondary | ICD-10-CM | POA: Diagnosis not present

## 2018-06-17 DIAGNOSIS — N186 End stage renal disease: Secondary | ICD-10-CM | POA: Diagnosis not present

## 2018-06-17 DIAGNOSIS — D631 Anemia in chronic kidney disease: Secondary | ICD-10-CM | POA: Diagnosis not present

## 2018-06-18 DIAGNOSIS — D631 Anemia in chronic kidney disease: Secondary | ICD-10-CM | POA: Diagnosis not present

## 2018-06-18 DIAGNOSIS — N2581 Secondary hyperparathyroidism of renal origin: Secondary | ICD-10-CM | POA: Diagnosis not present

## 2018-06-18 DIAGNOSIS — D509 Iron deficiency anemia, unspecified: Secondary | ICD-10-CM | POA: Diagnosis not present

## 2018-06-18 DIAGNOSIS — Z79899 Other long term (current) drug therapy: Secondary | ICD-10-CM | POA: Diagnosis not present

## 2018-06-18 DIAGNOSIS — N186 End stage renal disease: Secondary | ICD-10-CM | POA: Diagnosis not present

## 2018-06-19 DIAGNOSIS — Z79899 Other long term (current) drug therapy: Secondary | ICD-10-CM | POA: Diagnosis not present

## 2018-06-19 DIAGNOSIS — D631 Anemia in chronic kidney disease: Secondary | ICD-10-CM | POA: Diagnosis not present

## 2018-06-19 DIAGNOSIS — N2581 Secondary hyperparathyroidism of renal origin: Secondary | ICD-10-CM | POA: Diagnosis not present

## 2018-06-19 DIAGNOSIS — N186 End stage renal disease: Secondary | ICD-10-CM | POA: Diagnosis not present

## 2018-06-19 DIAGNOSIS — D509 Iron deficiency anemia, unspecified: Secondary | ICD-10-CM | POA: Diagnosis not present

## 2018-06-20 DIAGNOSIS — N186 End stage renal disease: Secondary | ICD-10-CM | POA: Diagnosis not present

## 2018-06-20 DIAGNOSIS — Z79899 Other long term (current) drug therapy: Secondary | ICD-10-CM | POA: Diagnosis not present

## 2018-06-20 DIAGNOSIS — N2581 Secondary hyperparathyroidism of renal origin: Secondary | ICD-10-CM | POA: Diagnosis not present

## 2018-06-20 DIAGNOSIS — D631 Anemia in chronic kidney disease: Secondary | ICD-10-CM | POA: Diagnosis not present

## 2018-06-20 DIAGNOSIS — D509 Iron deficiency anemia, unspecified: Secondary | ICD-10-CM | POA: Diagnosis not present

## 2018-06-21 DIAGNOSIS — D631 Anemia in chronic kidney disease: Secondary | ICD-10-CM | POA: Diagnosis not present

## 2018-06-21 DIAGNOSIS — N2581 Secondary hyperparathyroidism of renal origin: Secondary | ICD-10-CM | POA: Diagnosis not present

## 2018-06-21 DIAGNOSIS — D509 Iron deficiency anemia, unspecified: Secondary | ICD-10-CM | POA: Diagnosis not present

## 2018-06-21 DIAGNOSIS — N186 End stage renal disease: Secondary | ICD-10-CM | POA: Diagnosis not present

## 2018-06-21 DIAGNOSIS — Z79899 Other long term (current) drug therapy: Secondary | ICD-10-CM | POA: Diagnosis not present

## 2018-06-22 DIAGNOSIS — Z79899 Other long term (current) drug therapy: Secondary | ICD-10-CM | POA: Diagnosis not present

## 2018-06-22 DIAGNOSIS — N2581 Secondary hyperparathyroidism of renal origin: Secondary | ICD-10-CM | POA: Diagnosis not present

## 2018-06-22 DIAGNOSIS — N186 End stage renal disease: Secondary | ICD-10-CM | POA: Diagnosis not present

## 2018-06-22 DIAGNOSIS — D509 Iron deficiency anemia, unspecified: Secondary | ICD-10-CM | POA: Diagnosis not present

## 2018-06-22 DIAGNOSIS — D631 Anemia in chronic kidney disease: Secondary | ICD-10-CM | POA: Diagnosis not present

## 2018-06-23 DIAGNOSIS — D509 Iron deficiency anemia, unspecified: Secondary | ICD-10-CM | POA: Diagnosis not present

## 2018-06-23 DIAGNOSIS — N2581 Secondary hyperparathyroidism of renal origin: Secondary | ICD-10-CM | POA: Diagnosis not present

## 2018-06-23 DIAGNOSIS — N186 End stage renal disease: Secondary | ICD-10-CM | POA: Diagnosis not present

## 2018-06-23 DIAGNOSIS — D631 Anemia in chronic kidney disease: Secondary | ICD-10-CM | POA: Diagnosis not present

## 2018-06-23 DIAGNOSIS — Z79899 Other long term (current) drug therapy: Secondary | ICD-10-CM | POA: Diagnosis not present

## 2018-06-24 DIAGNOSIS — Z79899 Other long term (current) drug therapy: Secondary | ICD-10-CM | POA: Diagnosis not present

## 2018-06-24 DIAGNOSIS — D631 Anemia in chronic kidney disease: Secondary | ICD-10-CM | POA: Diagnosis not present

## 2018-06-24 DIAGNOSIS — D509 Iron deficiency anemia, unspecified: Secondary | ICD-10-CM | POA: Diagnosis not present

## 2018-06-24 DIAGNOSIS — N2581 Secondary hyperparathyroidism of renal origin: Secondary | ICD-10-CM | POA: Diagnosis not present

## 2018-06-24 DIAGNOSIS — N186 End stage renal disease: Secondary | ICD-10-CM | POA: Diagnosis not present

## 2018-06-25 DIAGNOSIS — Z79899 Other long term (current) drug therapy: Secondary | ICD-10-CM | POA: Diagnosis not present

## 2018-06-25 DIAGNOSIS — N2581 Secondary hyperparathyroidism of renal origin: Secondary | ICD-10-CM | POA: Diagnosis not present

## 2018-06-25 DIAGNOSIS — D509 Iron deficiency anemia, unspecified: Secondary | ICD-10-CM | POA: Diagnosis not present

## 2018-06-25 DIAGNOSIS — D631 Anemia in chronic kidney disease: Secondary | ICD-10-CM | POA: Diagnosis not present

## 2018-06-25 DIAGNOSIS — N186 End stage renal disease: Secondary | ICD-10-CM | POA: Diagnosis not present

## 2018-06-26 DIAGNOSIS — N2581 Secondary hyperparathyroidism of renal origin: Secondary | ICD-10-CM | POA: Diagnosis not present

## 2018-06-26 DIAGNOSIS — Z79899 Other long term (current) drug therapy: Secondary | ICD-10-CM | POA: Diagnosis not present

## 2018-06-26 DIAGNOSIS — D631 Anemia in chronic kidney disease: Secondary | ICD-10-CM | POA: Diagnosis not present

## 2018-06-26 DIAGNOSIS — D509 Iron deficiency anemia, unspecified: Secondary | ICD-10-CM | POA: Diagnosis not present

## 2018-06-26 DIAGNOSIS — N186 End stage renal disease: Secondary | ICD-10-CM | POA: Diagnosis not present

## 2018-06-27 ENCOUNTER — Other Ambulatory Visit: Payer: Self-pay

## 2018-06-27 ENCOUNTER — Ambulatory Visit
Admission: RE | Admit: 2018-06-27 | Discharge: 2018-06-27 | Disposition: A | Discharge status: Home / Self Care | Payer: Medicare Other | Source: Ambulatory Visit | Attending: Family Medicine | Admitting: Family Medicine

## 2018-06-27 DIAGNOSIS — Z1231 Encounter for screening mammogram for malignant neoplasm of breast: Secondary | ICD-10-CM

## 2018-06-27 DIAGNOSIS — D631 Anemia in chronic kidney disease: Secondary | ICD-10-CM | POA: Diagnosis not present

## 2018-06-27 DIAGNOSIS — D509 Iron deficiency anemia, unspecified: Secondary | ICD-10-CM | POA: Diagnosis not present

## 2018-06-27 DIAGNOSIS — N186 End stage renal disease: Secondary | ICD-10-CM | POA: Diagnosis not present

## 2018-06-27 DIAGNOSIS — N2581 Secondary hyperparathyroidism of renal origin: Secondary | ICD-10-CM | POA: Diagnosis not present

## 2018-06-27 DIAGNOSIS — Z79899 Other long term (current) drug therapy: Secondary | ICD-10-CM | POA: Diagnosis not present

## 2018-06-28 DIAGNOSIS — D509 Iron deficiency anemia, unspecified: Secondary | ICD-10-CM | POA: Diagnosis not present

## 2018-06-28 DIAGNOSIS — Z79899 Other long term (current) drug therapy: Secondary | ICD-10-CM | POA: Diagnosis not present

## 2018-06-28 DIAGNOSIS — D631 Anemia in chronic kidney disease: Secondary | ICD-10-CM | POA: Diagnosis not present

## 2018-06-28 DIAGNOSIS — N2581 Secondary hyperparathyroidism of renal origin: Secondary | ICD-10-CM | POA: Diagnosis not present

## 2018-06-28 DIAGNOSIS — N186 End stage renal disease: Secondary | ICD-10-CM | POA: Diagnosis not present

## 2018-06-29 DIAGNOSIS — D509 Iron deficiency anemia, unspecified: Secondary | ICD-10-CM | POA: Diagnosis not present

## 2018-06-29 DIAGNOSIS — N186 End stage renal disease: Secondary | ICD-10-CM | POA: Diagnosis not present

## 2018-06-29 DIAGNOSIS — D631 Anemia in chronic kidney disease: Secondary | ICD-10-CM | POA: Diagnosis not present

## 2018-06-29 DIAGNOSIS — Z79899 Other long term (current) drug therapy: Secondary | ICD-10-CM | POA: Diagnosis not present

## 2018-06-29 DIAGNOSIS — N2581 Secondary hyperparathyroidism of renal origin: Secondary | ICD-10-CM | POA: Diagnosis not present

## 2018-06-30 DIAGNOSIS — D631 Anemia in chronic kidney disease: Secondary | ICD-10-CM | POA: Diagnosis not present

## 2018-06-30 DIAGNOSIS — D509 Iron deficiency anemia, unspecified: Secondary | ICD-10-CM | POA: Diagnosis not present

## 2018-06-30 DIAGNOSIS — N2581 Secondary hyperparathyroidism of renal origin: Secondary | ICD-10-CM | POA: Diagnosis not present

## 2018-06-30 DIAGNOSIS — N186 End stage renal disease: Secondary | ICD-10-CM | POA: Diagnosis not present

## 2018-06-30 DIAGNOSIS — Z79899 Other long term (current) drug therapy: Secondary | ICD-10-CM | POA: Diagnosis not present

## 2018-07-01 DIAGNOSIS — Z79899 Other long term (current) drug therapy: Secondary | ICD-10-CM | POA: Diagnosis not present

## 2018-07-01 DIAGNOSIS — N2581 Secondary hyperparathyroidism of renal origin: Secondary | ICD-10-CM | POA: Diagnosis not present

## 2018-07-01 DIAGNOSIS — D631 Anemia in chronic kidney disease: Secondary | ICD-10-CM | POA: Diagnosis not present

## 2018-07-01 DIAGNOSIS — D509 Iron deficiency anemia, unspecified: Secondary | ICD-10-CM | POA: Diagnosis not present

## 2018-07-01 DIAGNOSIS — N186 End stage renal disease: Secondary | ICD-10-CM | POA: Diagnosis not present

## 2018-07-02 DIAGNOSIS — N2581 Secondary hyperparathyroidism of renal origin: Secondary | ICD-10-CM | POA: Diagnosis not present

## 2018-07-02 DIAGNOSIS — D631 Anemia in chronic kidney disease: Secondary | ICD-10-CM | POA: Diagnosis not present

## 2018-07-02 DIAGNOSIS — Z79899 Other long term (current) drug therapy: Secondary | ICD-10-CM | POA: Diagnosis not present

## 2018-07-02 DIAGNOSIS — N186 End stage renal disease: Secondary | ICD-10-CM | POA: Diagnosis not present

## 2018-07-02 DIAGNOSIS — D509 Iron deficiency anemia, unspecified: Secondary | ICD-10-CM | POA: Diagnosis not present

## 2018-07-03 DIAGNOSIS — N186 End stage renal disease: Secondary | ICD-10-CM | POA: Diagnosis not present

## 2018-07-03 DIAGNOSIS — Z79899 Other long term (current) drug therapy: Secondary | ICD-10-CM | POA: Diagnosis not present

## 2018-07-03 DIAGNOSIS — D631 Anemia in chronic kidney disease: Secondary | ICD-10-CM | POA: Diagnosis not present

## 2018-07-03 DIAGNOSIS — D509 Iron deficiency anemia, unspecified: Secondary | ICD-10-CM | POA: Diagnosis not present

## 2018-07-03 DIAGNOSIS — N2581 Secondary hyperparathyroidism of renal origin: Secondary | ICD-10-CM | POA: Diagnosis not present

## 2018-07-04 DIAGNOSIS — D509 Iron deficiency anemia, unspecified: Secondary | ICD-10-CM | POA: Diagnosis not present

## 2018-07-04 DIAGNOSIS — N186 End stage renal disease: Secondary | ICD-10-CM | POA: Diagnosis not present

## 2018-07-04 DIAGNOSIS — D631 Anemia in chronic kidney disease: Secondary | ICD-10-CM | POA: Diagnosis not present

## 2018-07-04 DIAGNOSIS — Z79899 Other long term (current) drug therapy: Secondary | ICD-10-CM | POA: Diagnosis not present

## 2018-07-04 DIAGNOSIS — N2581 Secondary hyperparathyroidism of renal origin: Secondary | ICD-10-CM | POA: Diagnosis not present

## 2018-07-05 DIAGNOSIS — Z79899 Other long term (current) drug therapy: Secondary | ICD-10-CM | POA: Diagnosis not present

## 2018-07-05 DIAGNOSIS — D509 Iron deficiency anemia, unspecified: Secondary | ICD-10-CM | POA: Diagnosis not present

## 2018-07-05 DIAGNOSIS — N2581 Secondary hyperparathyroidism of renal origin: Secondary | ICD-10-CM | POA: Diagnosis not present

## 2018-07-05 DIAGNOSIS — D631 Anemia in chronic kidney disease: Secondary | ICD-10-CM | POA: Diagnosis not present

## 2018-07-05 DIAGNOSIS — N186 End stage renal disease: Secondary | ICD-10-CM | POA: Diagnosis not present

## 2018-07-06 ENCOUNTER — Telehealth: Payer: Self-pay | Admitting: Family Medicine

## 2018-07-06 DIAGNOSIS — D631 Anemia in chronic kidney disease: Secondary | ICD-10-CM | POA: Diagnosis not present

## 2018-07-06 DIAGNOSIS — N2581 Secondary hyperparathyroidism of renal origin: Secondary | ICD-10-CM | POA: Diagnosis not present

## 2018-07-06 DIAGNOSIS — N186 End stage renal disease: Secondary | ICD-10-CM | POA: Diagnosis not present

## 2018-07-06 DIAGNOSIS — D509 Iron deficiency anemia, unspecified: Secondary | ICD-10-CM | POA: Diagnosis not present

## 2018-07-06 DIAGNOSIS — Z79899 Other long term (current) drug therapy: Secondary | ICD-10-CM | POA: Diagnosis not present

## 2018-07-06 NOTE — Telephone Encounter (Signed)
Patient returned phone call and was informed of mammogram.

## 2018-07-06 NOTE — Telephone Encounter (Signed)
Error

## 2018-07-07 DIAGNOSIS — N186 End stage renal disease: Secondary | ICD-10-CM | POA: Diagnosis not present

## 2018-07-07 DIAGNOSIS — N2581 Secondary hyperparathyroidism of renal origin: Secondary | ICD-10-CM | POA: Diagnosis not present

## 2018-07-07 DIAGNOSIS — D509 Iron deficiency anemia, unspecified: Secondary | ICD-10-CM | POA: Diagnosis not present

## 2018-07-07 DIAGNOSIS — Z79899 Other long term (current) drug therapy: Secondary | ICD-10-CM | POA: Diagnosis not present

## 2018-07-07 DIAGNOSIS — D631 Anemia in chronic kidney disease: Secondary | ICD-10-CM | POA: Diagnosis not present

## 2018-07-08 DIAGNOSIS — Z79899 Other long term (current) drug therapy: Secondary | ICD-10-CM | POA: Diagnosis not present

## 2018-07-08 DIAGNOSIS — D631 Anemia in chronic kidney disease: Secondary | ICD-10-CM | POA: Diagnosis not present

## 2018-07-08 DIAGNOSIS — N186 End stage renal disease: Secondary | ICD-10-CM | POA: Diagnosis not present

## 2018-07-08 DIAGNOSIS — N2581 Secondary hyperparathyroidism of renal origin: Secondary | ICD-10-CM | POA: Diagnosis not present

## 2018-07-08 DIAGNOSIS — D509 Iron deficiency anemia, unspecified: Secondary | ICD-10-CM | POA: Diagnosis not present

## 2018-07-09 DIAGNOSIS — D631 Anemia in chronic kidney disease: Secondary | ICD-10-CM | POA: Diagnosis not present

## 2018-07-09 DIAGNOSIS — N186 End stage renal disease: Secondary | ICD-10-CM | POA: Diagnosis not present

## 2018-07-09 DIAGNOSIS — N2581 Secondary hyperparathyroidism of renal origin: Secondary | ICD-10-CM | POA: Diagnosis not present

## 2018-07-09 DIAGNOSIS — Z79899 Other long term (current) drug therapy: Secondary | ICD-10-CM | POA: Diagnosis not present

## 2018-07-09 DIAGNOSIS — D509 Iron deficiency anemia, unspecified: Secondary | ICD-10-CM | POA: Diagnosis not present

## 2018-07-10 DIAGNOSIS — Z79899 Other long term (current) drug therapy: Secondary | ICD-10-CM | POA: Diagnosis not present

## 2018-07-10 DIAGNOSIS — N186 End stage renal disease: Secondary | ICD-10-CM | POA: Diagnosis not present

## 2018-07-10 DIAGNOSIS — D509 Iron deficiency anemia, unspecified: Secondary | ICD-10-CM | POA: Diagnosis not present

## 2018-07-10 DIAGNOSIS — N2581 Secondary hyperparathyroidism of renal origin: Secondary | ICD-10-CM | POA: Diagnosis not present

## 2018-07-10 DIAGNOSIS — D631 Anemia in chronic kidney disease: Secondary | ICD-10-CM | POA: Diagnosis not present

## 2018-07-11 DIAGNOSIS — Z79899 Other long term (current) drug therapy: Secondary | ICD-10-CM | POA: Diagnosis not present

## 2018-07-11 DIAGNOSIS — N186 End stage renal disease: Secondary | ICD-10-CM | POA: Diagnosis not present

## 2018-07-11 DIAGNOSIS — D509 Iron deficiency anemia, unspecified: Secondary | ICD-10-CM | POA: Diagnosis not present

## 2018-07-11 DIAGNOSIS — N2581 Secondary hyperparathyroidism of renal origin: Secondary | ICD-10-CM | POA: Diagnosis not present

## 2018-07-11 DIAGNOSIS — D631 Anemia in chronic kidney disease: Secondary | ICD-10-CM | POA: Diagnosis not present

## 2018-07-12 ENCOUNTER — Other Ambulatory Visit: Payer: Self-pay

## 2018-07-12 ENCOUNTER — Ambulatory Visit: Payer: Medicare Other | Admitting: Allergy

## 2018-07-12 ENCOUNTER — Ambulatory Visit: Payer: Medicare Other | Attending: Family Medicine | Admitting: Family Medicine

## 2018-07-12 DIAGNOSIS — N186 End stage renal disease: Secondary | ICD-10-CM | POA: Diagnosis not present

## 2018-07-12 DIAGNOSIS — N2581 Secondary hyperparathyroidism of renal origin: Secondary | ICD-10-CM | POA: Diagnosis not present

## 2018-07-12 DIAGNOSIS — Z992 Dependence on renal dialysis: Secondary | ICD-10-CM | POA: Diagnosis not present

## 2018-07-12 DIAGNOSIS — E1165 Type 2 diabetes mellitus with hyperglycemia: Secondary | ICD-10-CM | POA: Diagnosis not present

## 2018-07-12 DIAGNOSIS — R82998 Other abnormal findings in urine: Secondary | ICD-10-CM | POA: Diagnosis not present

## 2018-07-12 DIAGNOSIS — D509 Iron deficiency anemia, unspecified: Secondary | ICD-10-CM | POA: Diagnosis not present

## 2018-07-12 DIAGNOSIS — IMO0002 Reserved for concepts with insufficient information to code with codable children: Secondary | ICD-10-CM

## 2018-07-12 DIAGNOSIS — E1142 Type 2 diabetes mellitus with diabetic polyneuropathy: Secondary | ICD-10-CM

## 2018-07-12 DIAGNOSIS — Z1211 Encounter for screening for malignant neoplasm of colon: Secondary | ICD-10-CM

## 2018-07-12 DIAGNOSIS — D631 Anemia in chronic kidney disease: Secondary | ICD-10-CM | POA: Diagnosis not present

## 2018-07-12 DIAGNOSIS — Z4932 Encounter for adequacy testing for peritoneal dialysis: Secondary | ICD-10-CM | POA: Diagnosis not present

## 2018-07-12 DIAGNOSIS — E1122 Type 2 diabetes mellitus with diabetic chronic kidney disease: Secondary | ICD-10-CM | POA: Diagnosis not present

## 2018-07-12 DIAGNOSIS — E1129 Type 2 diabetes mellitus with other diabetic kidney complication: Secondary | ICD-10-CM | POA: Diagnosis not present

## 2018-07-12 DIAGNOSIS — E7849 Other hyperlipidemia: Secondary | ICD-10-CM | POA: Diagnosis not present

## 2018-07-12 NOTE — Progress Notes (Signed)
Patient has been called and DOB has been verified. Patient has been screened and transferred to PCP to start phone visit.    Patient needs a referral to gastro for colonoscopy.

## 2018-07-12 NOTE — Progress Notes (Signed)
Virtual Visit via Telephone Note  I connected with Susan Horton, on 07/12/2018 at 11:11 AM by telephone due to the COVID-19 pandemic and verified that I am speaking with the correct person using two identifiers.   Consent: I discussed the limitations, risks, security and privacy concerns of performing an evaluation and management service by telephone and the availability of in person appointments. I also discussed with the patient that there may be a patient responsible charge related to this service. The patient expressed understanding and agreed to proceed.   Location of Patient: Home  Location of Provider: Clinic   Persons participating in Telemedicine visit: Jerilee Field Farrington-CMA Dr. Felecia Shelling     History of Present Illness: Susan Horton is a 48 year old female with a history of Type 2 Diabetes Mellitus (A1c 9.8), Hypertension, CAD, ESRD on hemodialysis, Asthma questing a colonoscopy referral today. This is needed due to her being on the renal transplant list. At her last visit she had previously complained of pruritus which resolved ever since she was taken off [place] by cardiology and she also has topical antipruritic creams. She would like a prescription for continuous blood glucose monitor but review of her chart and speaking with the pharmacy tech reveals her endocrinologist had placed this order at her visit last month and they would need to work on obtaining the prior authorization for her. She has no additional concerns today.   Past Medical History:  Diagnosis Date  . Anemia   . Angio-edema   . Asthma   . CAD (coronary artery disease)    DES to mid LAD July 2018, residual moderate RCA disease  . Cataract   . CKD (chronic kidney disease) stage 4, GFR 15-29 ml/min (HCC)    Dialysis T/Th/Sa  . DVT (deep venous thrombosis) ([place])    1996 during pregnancy, 2015 left leg  . Eczema   . Essential hypertension 12/11/2015  . Gastroparesis   . GERD  (gastroesophageal reflux disease)   . Hidradenitis   . Migraine   . Neuropathy   . Peripheral vascular disease ([place])    blood clot in leg  . Type 2 diabetes mellitus ([place])   . Type II diabetes mellitus ([place])   . Urticaria    Allergies  Allergen Reactions  . Penicillins Anaphylaxis, Itching, Swelling, Rash and Other (See Comments)    Swelling of throat & whole mouth  Has patient had a PCN reaction causing immediate rash, facial/tongue/throat swelling, SOB or lightheadedness with hypotension: Yes Has patient had a PCN reaction causing severe rash involving mucus membranes or skin necrosis: No Has patient had a PCN reaction that required hospitalization: No Has patient had a PCN reaction occurring within the last 10 years: No If all of the above answers are "NO", then may proceed with Cephalosporin use.  . Repatha [Evolocumab] Itching  . Lisinopril Cough    Current Outpatient Medications on File Prior to Visit  Medication Sig Dispense Refill  . albuterol (PROVENTIL HFA;VENTOLIN HFA) 108 (90 BASE) MCG/ACT inhaler Inhale 1-2 puffs into the lungs every 6 (six) hours as needed for wheezing or shortness of breath.     [name] aspirin 81 MG chewable tablet Chew 1 tablet (81 mg total) by mouth daily. 30 tablet 0  . calcium acetate (PHOSLO) 667 MG capsule TAKE 1 CAPSULE BY MOUTH THREE TIMES DAILY WITH MEALS AND 1 CAPSULE TWO TIMES DAILY WITH SNACKS  10  . capsicum (ZOSTRIX) 0.075 % topical cream Apply 1 application  topically 2 (two) times daily. 28.3 g 0  . clopidogrel (PLAVIX) 75 MG tablet Take 1 tablet (75 mg total) by mouth daily with breakfast. 90 tablet 3  . Continuous Blood Gluc Sensor (FREESTYLE LIBRE 14 DAY SENSOR) MISC 1 each by Does not apply route every 14 (fourteen) days. Change every 2 weeks 2 each 11  . Ferrous Sulfate (IRON) 325 (65 Fe) MG TABS Take 325 mg by mouth daily. 30 each 3  . Fluticasone-Salmeterol (ADVAIR) 250-50 MCG/DOSE AEPB Inhale 1 puff into the lungs 2 (two) times daily.  (Patient taking differently: Inhale 1 puff into the lungs daily as needed (shortness of breath). ) 60 each 3  . Insulin Glargine, 1 Unit Dial, (TOUJEO SOLOSTAR) 300 UNIT/ML SOPN Inject 25 Units into the skin 2 (two) times a day. 12 pen 3  . insulin regular (HUMULIN R) 100 units/mL injection Inject 0.2-0.25 mLs (20-25 Units total) into the skin See admin instructions. 45 mL 3  . lovastatin (MEVACOR) 20 MG tablet Take 20 mg by mouth at bedtime.     . methocarbamol (ROBAXIN) 500 MG tablet Take 1 tablet (500 mg total) by mouth 2 (two) times daily as needed for muscle spasms. 60 tablet 1  . metoCLOPramide (REGLAN) 5 MG tablet TAKE 1 TABLET BY MOUTH 4 TIMES DAILY BEFORE MEALS AND AT BEDTIME. (Patient taking differently: Take 5 mg by mouth 4 (four) times daily -  before meals and at bedtime. ) 90 tablet 0  . metoprolol succinate (TOPROL-XL) 50 MG 24 hr tablet Take 1 tablet (50 mg total) by mouth daily. 90 tablet 3  . montelukast (SINGULAIR) 10 MG tablet Take 10 mg by mouth daily as needed (allergies).     . montelukast (SINGULAIR) 10 MG tablet Take 1 tablet (10 mg total) by mouth at bedtime. 30 tablet 5  . multivitamin (RENA-VIT) TABS tablet Take 1 tablet by mouth at bedtime.  0  . nitroGLYCERIN (NITROSTAT) 0.4 MG SL tablet Place 1 tablet (0.4 mg total) under the tongue every 5 (five) minutes as needed for chest pain. 25 tablet 2  . pantoprazole (PROTONIX) 40 MG tablet TAKE 1 TABLET BY MOUTH DAILY. 30 tablet 7  . senna-docusate (SENOKOT-S) 8.6-50 MG tablet Take 1 tablet by mouth 2 (two) times daily. (Patient taking differently: Take 1 tablet by mouth daily as needed for moderate constipation. )    . trimethoprim-polymyxin b (POLYTRIM) ophthalmic solution INSTILL ONE DROP INTO BOTH EYES 4 TIMES A DAY FOR 2 DAYS AFTER EACH MONTHLY EYE INJECTION  12  . fluconazole (DIFLUCAN) 150 MG tablet Take 1 tablet (150 mg total) by mouth daily. (Patient not taking: Reported on 07/12/2018) 2 tablet 0  . isosorbide mononitrate  (IMDUR) 30 MG 24 hr tablet Take 0.5 tablets (15 mg total) by mouth daily. 45 tablet 3  . lidocaine (XYLOCAINE) 5 % ointment Apply 1 application topically as needed. (Patient not taking: Reported on 07/12/2018) 35.44 g 0  . lidocaine-prilocaine (EMLA) cream APPLY SMALL AMOUNT TO ACCESS SITE (AVF) 30 MINUTES BEFORE DIALYSIS. COVER WITH OCCLUSIVE DRESSING (SARAN WRAP) THREE DAYS A WEEK (3 TIMES A  5   No current facility-administered medications on file prior to visit.     Observations/Objective: Alert, awake, oriented x3 Not in acute distress   Lab Results  Component Value Date   HGBA1C 9.8 (A) 03/22/2018    Assessment and Plan: 1. Uncontrolled type 2 diabetes mellitus with peripheral neuropathy ([place]) Controlled with A1c of 9.8 Management as per endocrine Pharmacy tech to  contact patient with information that prescribing office needs to perform prior auth  2. Screening for colon cancer Screening required due to being on renal transplant list. - Ambulatory referral to Gastroenterology   Follow Up Instructions: Keep previously scheduled appointment for follow-up of chronic medical conditions   I discussed the assessment and treatment plan with the patient. The patient was provided an opportunity to ask questions and all were answered. The patient agreed with the plan and demonstrated an understanding of the instructions.   The patient was advised to call back or seek an in-person evaluation if the symptoms worsen or if the condition fails to improve as anticipated.     I provided 11 minutes total of non-face-to-face time during this encounter including median intraservice time, reviewing previous notes, labs, imaging, medications, management and patient verbalized understanding.     Charlott Rakes, MD, FAAFP. [facility], [place]   07/12/2018, 11:11 AM

## 2018-07-13 DIAGNOSIS — D631 Anemia in chronic kidney disease: Secondary | ICD-10-CM | POA: Diagnosis not present

## 2018-07-13 DIAGNOSIS — N186 End stage renal disease: Secondary | ICD-10-CM | POA: Diagnosis not present

## 2018-07-13 DIAGNOSIS — D509 Iron deficiency anemia, unspecified: Secondary | ICD-10-CM | POA: Diagnosis not present

## 2018-07-13 DIAGNOSIS — N2581 Secondary hyperparathyroidism of renal origin: Secondary | ICD-10-CM | POA: Diagnosis not present

## 2018-07-13 DIAGNOSIS — Z4932 Encounter for adequacy testing for peritoneal dialysis: Secondary | ICD-10-CM | POA: Diagnosis not present

## 2018-07-14 DIAGNOSIS — Z4932 Encounter for adequacy testing for peritoneal dialysis: Secondary | ICD-10-CM | POA: Diagnosis not present

## 2018-07-14 DIAGNOSIS — D509 Iron deficiency anemia, unspecified: Secondary | ICD-10-CM | POA: Diagnosis not present

## 2018-07-14 DIAGNOSIS — N186 End stage renal disease: Secondary | ICD-10-CM | POA: Diagnosis not present

## 2018-07-14 DIAGNOSIS — N2581 Secondary hyperparathyroidism of renal origin: Secondary | ICD-10-CM | POA: Diagnosis not present

## 2018-07-14 DIAGNOSIS — D631 Anemia in chronic kidney disease: Secondary | ICD-10-CM | POA: Diagnosis not present

## 2018-07-15 DIAGNOSIS — N2581 Secondary hyperparathyroidism of renal origin: Secondary | ICD-10-CM | POA: Diagnosis not present

## 2018-07-15 DIAGNOSIS — D509 Iron deficiency anemia, unspecified: Secondary | ICD-10-CM | POA: Diagnosis not present

## 2018-07-15 DIAGNOSIS — N186 End stage renal disease: Secondary | ICD-10-CM | POA: Diagnosis not present

## 2018-07-15 DIAGNOSIS — D631 Anemia in chronic kidney disease: Secondary | ICD-10-CM | POA: Diagnosis not present

## 2018-07-15 DIAGNOSIS — Z4932 Encounter for adequacy testing for peritoneal dialysis: Secondary | ICD-10-CM | POA: Diagnosis not present

## 2018-07-16 DIAGNOSIS — D509 Iron deficiency anemia, unspecified: Secondary | ICD-10-CM | POA: Diagnosis not present

## 2018-07-16 DIAGNOSIS — N2581 Secondary hyperparathyroidism of renal origin: Secondary | ICD-10-CM | POA: Diagnosis not present

## 2018-07-16 DIAGNOSIS — Z4932 Encounter for adequacy testing for peritoneal dialysis: Secondary | ICD-10-CM | POA: Diagnosis not present

## 2018-07-16 DIAGNOSIS — N186 End stage renal disease: Secondary | ICD-10-CM | POA: Diagnosis not present

## 2018-07-16 DIAGNOSIS — D631 Anemia in chronic kidney disease: Secondary | ICD-10-CM | POA: Diagnosis not present

## 2018-07-17 DIAGNOSIS — D509 Iron deficiency anemia, unspecified: Secondary | ICD-10-CM | POA: Diagnosis not present

## 2018-07-17 DIAGNOSIS — N2581 Secondary hyperparathyroidism of renal origin: Secondary | ICD-10-CM | POA: Diagnosis not present

## 2018-07-17 DIAGNOSIS — N186 End stage renal disease: Secondary | ICD-10-CM | POA: Diagnosis not present

## 2018-07-17 DIAGNOSIS — Z4932 Encounter for adequacy testing for peritoneal dialysis: Secondary | ICD-10-CM | POA: Diagnosis not present

## 2018-07-17 DIAGNOSIS — D631 Anemia in chronic kidney disease: Secondary | ICD-10-CM | POA: Diagnosis not present

## 2018-07-18 DIAGNOSIS — Z4932 Encounter for adequacy testing for peritoneal dialysis: Secondary | ICD-10-CM | POA: Diagnosis not present

## 2018-07-18 DIAGNOSIS — D631 Anemia in chronic kidney disease: Secondary | ICD-10-CM | POA: Diagnosis not present

## 2018-07-18 DIAGNOSIS — N186 End stage renal disease: Secondary | ICD-10-CM | POA: Diagnosis not present

## 2018-07-18 DIAGNOSIS — D509 Iron deficiency anemia, unspecified: Secondary | ICD-10-CM | POA: Diagnosis not present

## 2018-07-18 DIAGNOSIS — N2581 Secondary hyperparathyroidism of renal origin: Secondary | ICD-10-CM | POA: Diagnosis not present

## 2018-07-19 ENCOUNTER — Encounter (HOSPITAL_COMMUNITY): Payer: Medicare Other

## 2018-07-19 DIAGNOSIS — D509 Iron deficiency anemia, unspecified: Secondary | ICD-10-CM | POA: Diagnosis not present

## 2018-07-19 DIAGNOSIS — Z4932 Encounter for adequacy testing for peritoneal dialysis: Secondary | ICD-10-CM | POA: Diagnosis not present

## 2018-07-19 DIAGNOSIS — N2581 Secondary hyperparathyroidism of renal origin: Secondary | ICD-10-CM | POA: Diagnosis not present

## 2018-07-19 DIAGNOSIS — D631 Anemia in chronic kidney disease: Secondary | ICD-10-CM | POA: Diagnosis not present

## 2018-07-19 DIAGNOSIS — N186 End stage renal disease: Secondary | ICD-10-CM | POA: Diagnosis not present

## 2018-07-20 DIAGNOSIS — D631 Anemia in chronic kidney disease: Secondary | ICD-10-CM | POA: Diagnosis not present

## 2018-07-20 DIAGNOSIS — Z4932 Encounter for adequacy testing for peritoneal dialysis: Secondary | ICD-10-CM | POA: Diagnosis not present

## 2018-07-20 DIAGNOSIS — N186 End stage renal disease: Secondary | ICD-10-CM | POA: Diagnosis not present

## 2018-07-20 DIAGNOSIS — D509 Iron deficiency anemia, unspecified: Secondary | ICD-10-CM | POA: Diagnosis not present

## 2018-07-20 DIAGNOSIS — N2581 Secondary hyperparathyroidism of renal origin: Secondary | ICD-10-CM | POA: Diagnosis not present

## 2018-07-21 DIAGNOSIS — D631 Anemia in chronic kidney disease: Secondary | ICD-10-CM | POA: Diagnosis not present

## 2018-07-21 DIAGNOSIS — D509 Iron deficiency anemia, unspecified: Secondary | ICD-10-CM | POA: Diagnosis not present

## 2018-07-21 DIAGNOSIS — N2581 Secondary hyperparathyroidism of renal origin: Secondary | ICD-10-CM | POA: Diagnosis not present

## 2018-07-21 DIAGNOSIS — N186 End stage renal disease: Secondary | ICD-10-CM | POA: Diagnosis not present

## 2018-07-21 DIAGNOSIS — Z4932 Encounter for adequacy testing for peritoneal dialysis: Secondary | ICD-10-CM | POA: Diagnosis not present

## 2018-07-22 DIAGNOSIS — D509 Iron deficiency anemia, unspecified: Secondary | ICD-10-CM | POA: Diagnosis not present

## 2018-07-22 DIAGNOSIS — D631 Anemia in chronic kidney disease: Secondary | ICD-10-CM | POA: Diagnosis not present

## 2018-07-22 DIAGNOSIS — N2581 Secondary hyperparathyroidism of renal origin: Secondary | ICD-10-CM | POA: Diagnosis not present

## 2018-07-22 DIAGNOSIS — Z4932 Encounter for adequacy testing for peritoneal dialysis: Secondary | ICD-10-CM | POA: Diagnosis not present

## 2018-07-22 DIAGNOSIS — N186 End stage renal disease: Secondary | ICD-10-CM | POA: Diagnosis not present

## 2018-07-23 DIAGNOSIS — N2581 Secondary hyperparathyroidism of renal origin: Secondary | ICD-10-CM | POA: Diagnosis not present

## 2018-07-23 DIAGNOSIS — D631 Anemia in chronic kidney disease: Secondary | ICD-10-CM | POA: Diagnosis not present

## 2018-07-23 DIAGNOSIS — D509 Iron deficiency anemia, unspecified: Secondary | ICD-10-CM | POA: Diagnosis not present

## 2018-07-23 DIAGNOSIS — N186 End stage renal disease: Secondary | ICD-10-CM | POA: Diagnosis not present

## 2018-07-23 DIAGNOSIS — Z4932 Encounter for adequacy testing for peritoneal dialysis: Secondary | ICD-10-CM | POA: Diagnosis not present

## 2018-07-24 DIAGNOSIS — N2581 Secondary hyperparathyroidism of renal origin: Secondary | ICD-10-CM | POA: Diagnosis not present

## 2018-07-24 DIAGNOSIS — Z4932 Encounter for adequacy testing for peritoneal dialysis: Secondary | ICD-10-CM | POA: Diagnosis not present

## 2018-07-24 DIAGNOSIS — N186 End stage renal disease: Secondary | ICD-10-CM | POA: Diagnosis not present

## 2018-07-24 DIAGNOSIS — D631 Anemia in chronic kidney disease: Secondary | ICD-10-CM | POA: Diagnosis not present

## 2018-07-24 DIAGNOSIS — D509 Iron deficiency anemia, unspecified: Secondary | ICD-10-CM | POA: Diagnosis not present

## 2018-07-25 ENCOUNTER — Ambulatory Visit (HOSPITAL_COMMUNITY)
Admission: RE | Admit: 2018-07-25 | Discharge: 2018-07-25 | Disposition: A | Discharge status: Home / Self Care | Payer: Medicare Other | Source: Ambulatory Visit | Attending: Cardiology | Admitting: Cardiology

## 2018-07-25 ENCOUNTER — Other Ambulatory Visit: Payer: Self-pay

## 2018-07-25 ENCOUNTER — Other Ambulatory Visit: Payer: Self-pay | Admitting: Cardiovascular Disease

## 2018-07-25 DIAGNOSIS — N2581 Secondary hyperparathyroidism of renal origin: Secondary | ICD-10-CM | POA: Diagnosis not present

## 2018-07-25 DIAGNOSIS — N186 End stage renal disease: Secondary | ICD-10-CM | POA: Diagnosis not present

## 2018-07-25 DIAGNOSIS — R0989 Other specified symptoms and signs involving the circulatory and respiratory systems: Secondary | ICD-10-CM

## 2018-07-25 DIAGNOSIS — Z4932 Encounter for adequacy testing for peritoneal dialysis: Secondary | ICD-10-CM | POA: Diagnosis not present

## 2018-07-25 DIAGNOSIS — D631 Anemia in chronic kidney disease: Secondary | ICD-10-CM | POA: Diagnosis not present

## 2018-07-25 DIAGNOSIS — D509 Iron deficiency anemia, unspecified: Secondary | ICD-10-CM | POA: Diagnosis not present

## 2018-07-25 DIAGNOSIS — I6522 Occlusion and stenosis of left carotid artery: Secondary | ICD-10-CM

## 2018-07-25 MED FILL — ISOSORBIDE MN ER 30 MG TAB: 30 | 90 days supply | Qty: 45 | Fill #6

## 2018-07-26 DIAGNOSIS — Z4932 Encounter for adequacy testing for peritoneal dialysis: Secondary | ICD-10-CM | POA: Diagnosis not present

## 2018-07-26 DIAGNOSIS — N2581 Secondary hyperparathyroidism of renal origin: Secondary | ICD-10-CM | POA: Diagnosis not present

## 2018-07-26 DIAGNOSIS — D631 Anemia in chronic kidney disease: Secondary | ICD-10-CM | POA: Diagnosis not present

## 2018-07-26 DIAGNOSIS — N186 End stage renal disease: Secondary | ICD-10-CM | POA: Diagnosis not present

## 2018-07-26 DIAGNOSIS — D509 Iron deficiency anemia, unspecified: Secondary | ICD-10-CM | POA: Diagnosis not present

## 2018-07-27 DIAGNOSIS — D631 Anemia in chronic kidney disease: Secondary | ICD-10-CM | POA: Diagnosis not present

## 2018-07-27 DIAGNOSIS — D509 Iron deficiency anemia, unspecified: Secondary | ICD-10-CM | POA: Diagnosis not present

## 2018-07-27 DIAGNOSIS — Z4932 Encounter for adequacy testing for peritoneal dialysis: Secondary | ICD-10-CM | POA: Diagnosis not present

## 2018-07-27 DIAGNOSIS — N186 End stage renal disease: Secondary | ICD-10-CM | POA: Diagnosis not present

## 2018-07-27 DIAGNOSIS — N2581 Secondary hyperparathyroidism of renal origin: Secondary | ICD-10-CM | POA: Diagnosis not present

## 2018-07-28 DIAGNOSIS — Z4932 Encounter for adequacy testing for peritoneal dialysis: Secondary | ICD-10-CM | POA: Diagnosis not present

## 2018-07-28 DIAGNOSIS — N186 End stage renal disease: Secondary | ICD-10-CM | POA: Diagnosis not present

## 2018-07-28 DIAGNOSIS — N2581 Secondary hyperparathyroidism of renal origin: Secondary | ICD-10-CM | POA: Diagnosis not present

## 2018-07-28 DIAGNOSIS — D631 Anemia in chronic kidney disease: Secondary | ICD-10-CM | POA: Diagnosis not present

## 2018-07-28 DIAGNOSIS — D509 Iron deficiency anemia, unspecified: Secondary | ICD-10-CM | POA: Diagnosis not present

## 2018-07-29 DIAGNOSIS — D631 Anemia in chronic kidney disease: Secondary | ICD-10-CM | POA: Diagnosis not present

## 2018-07-29 DIAGNOSIS — N2581 Secondary hyperparathyroidism of renal origin: Secondary | ICD-10-CM | POA: Diagnosis not present

## 2018-07-29 DIAGNOSIS — Z4932 Encounter for adequacy testing for peritoneal dialysis: Secondary | ICD-10-CM | POA: Diagnosis not present

## 2018-07-29 DIAGNOSIS — N186 End stage renal disease: Secondary | ICD-10-CM | POA: Diagnosis not present

## 2018-07-29 DIAGNOSIS — D509 Iron deficiency anemia, unspecified: Secondary | ICD-10-CM | POA: Diagnosis not present

## 2018-07-30 DIAGNOSIS — Z4932 Encounter for adequacy testing for peritoneal dialysis: Secondary | ICD-10-CM | POA: Diagnosis not present

## 2018-07-30 DIAGNOSIS — N186 End stage renal disease: Secondary | ICD-10-CM | POA: Diagnosis not present

## 2018-07-30 DIAGNOSIS — N2581 Secondary hyperparathyroidism of renal origin: Secondary | ICD-10-CM | POA: Diagnosis not present

## 2018-07-30 DIAGNOSIS — D631 Anemia in chronic kidney disease: Secondary | ICD-10-CM | POA: Diagnosis not present

## 2018-07-30 DIAGNOSIS — D509 Iron deficiency anemia, unspecified: Secondary | ICD-10-CM | POA: Diagnosis not present

## 2018-07-31 DIAGNOSIS — N186 End stage renal disease: Secondary | ICD-10-CM | POA: Diagnosis not present

## 2018-07-31 DIAGNOSIS — D631 Anemia in chronic kidney disease: Secondary | ICD-10-CM | POA: Diagnosis not present

## 2018-07-31 DIAGNOSIS — N2581 Secondary hyperparathyroidism of renal origin: Secondary | ICD-10-CM | POA: Diagnosis not present

## 2018-07-31 DIAGNOSIS — Z4932 Encounter for adequacy testing for peritoneal dialysis: Secondary | ICD-10-CM | POA: Diagnosis not present

## 2018-07-31 DIAGNOSIS — D509 Iron deficiency anemia, unspecified: Secondary | ICD-10-CM | POA: Diagnosis not present

## 2018-08-01 DIAGNOSIS — N186 End stage renal disease: Secondary | ICD-10-CM | POA: Diagnosis not present

## 2018-08-01 DIAGNOSIS — D509 Iron deficiency anemia, unspecified: Secondary | ICD-10-CM | POA: Diagnosis not present

## 2018-08-01 DIAGNOSIS — N2581 Secondary hyperparathyroidism of renal origin: Secondary | ICD-10-CM | POA: Diagnosis not present

## 2018-08-01 DIAGNOSIS — Z4932 Encounter for adequacy testing for peritoneal dialysis: Secondary | ICD-10-CM | POA: Diagnosis not present

## 2018-08-01 DIAGNOSIS — D631 Anemia in chronic kidney disease: Secondary | ICD-10-CM | POA: Diagnosis not present

## 2018-08-02 ENCOUNTER — Telehealth: Payer: Self-pay | Admitting: Family Medicine

## 2018-08-02 DIAGNOSIS — D509 Iron deficiency anemia, unspecified: Secondary | ICD-10-CM | POA: Diagnosis not present

## 2018-08-02 DIAGNOSIS — N2581 Secondary hyperparathyroidism of renal origin: Secondary | ICD-10-CM | POA: Diagnosis not present

## 2018-08-02 DIAGNOSIS — Z4932 Encounter for adequacy testing for peritoneal dialysis: Secondary | ICD-10-CM | POA: Diagnosis not present

## 2018-08-02 DIAGNOSIS — D631 Anemia in chronic kidney disease: Secondary | ICD-10-CM | POA: Diagnosis not present

## 2018-08-02 DIAGNOSIS — N186 End stage renal disease: Secondary | ICD-10-CM | POA: Diagnosis not present

## 2018-08-03 DIAGNOSIS — Z4932 Encounter for adequacy testing for peritoneal dialysis: Secondary | ICD-10-CM | POA: Diagnosis not present

## 2018-08-03 DIAGNOSIS — N2581 Secondary hyperparathyroidism of renal origin: Secondary | ICD-10-CM | POA: Diagnosis not present

## 2018-08-03 DIAGNOSIS — N186 End stage renal disease: Secondary | ICD-10-CM | POA: Diagnosis not present

## 2018-08-03 DIAGNOSIS — D631 Anemia in chronic kidney disease: Secondary | ICD-10-CM | POA: Diagnosis not present

## 2018-08-03 DIAGNOSIS — D509 Iron deficiency anemia, unspecified: Secondary | ICD-10-CM | POA: Diagnosis not present

## 2018-08-04 DIAGNOSIS — D509 Iron deficiency anemia, unspecified: Secondary | ICD-10-CM | POA: Diagnosis not present

## 2018-08-04 DIAGNOSIS — N186 End stage renal disease: Secondary | ICD-10-CM | POA: Diagnosis not present

## 2018-08-04 DIAGNOSIS — Z4932 Encounter for adequacy testing for peritoneal dialysis: Secondary | ICD-10-CM | POA: Diagnosis not present

## 2018-08-04 DIAGNOSIS — D631 Anemia in chronic kidney disease: Secondary | ICD-10-CM | POA: Diagnosis not present

## 2018-08-04 DIAGNOSIS — N2581 Secondary hyperparathyroidism of renal origin: Secondary | ICD-10-CM | POA: Diagnosis not present

## 2018-08-05 DIAGNOSIS — N2581 Secondary hyperparathyroidism of renal origin: Secondary | ICD-10-CM | POA: Diagnosis not present

## 2018-08-05 DIAGNOSIS — N186 End stage renal disease: Secondary | ICD-10-CM | POA: Diagnosis not present

## 2018-08-05 DIAGNOSIS — D509 Iron deficiency anemia, unspecified: Secondary | ICD-10-CM | POA: Diagnosis not present

## 2018-08-05 DIAGNOSIS — Z4932 Encounter for adequacy testing for peritoneal dialysis: Secondary | ICD-10-CM | POA: Diagnosis not present

## 2018-08-05 DIAGNOSIS — D631 Anemia in chronic kidney disease: Secondary | ICD-10-CM | POA: Diagnosis not present

## 2018-08-06 DIAGNOSIS — N186 End stage renal disease: Secondary | ICD-10-CM | POA: Diagnosis not present

## 2018-08-06 DIAGNOSIS — D631 Anemia in chronic kidney disease: Secondary | ICD-10-CM | POA: Diagnosis not present

## 2018-08-06 DIAGNOSIS — N2581 Secondary hyperparathyroidism of renal origin: Secondary | ICD-10-CM | POA: Diagnosis not present

## 2018-08-06 DIAGNOSIS — D509 Iron deficiency anemia, unspecified: Secondary | ICD-10-CM | POA: Diagnosis not present

## 2018-08-06 DIAGNOSIS — Z4932 Encounter for adequacy testing for peritoneal dialysis: Secondary | ICD-10-CM | POA: Diagnosis not present

## 2018-08-07 ENCOUNTER — Telehealth: Payer: Self-pay | Admitting: Family Medicine

## 2018-08-07 DIAGNOSIS — N186 End stage renal disease: Secondary | ICD-10-CM | POA: Diagnosis not present

## 2018-08-07 DIAGNOSIS — N2581 Secondary hyperparathyroidism of renal origin: Secondary | ICD-10-CM | POA: Diagnosis not present

## 2018-08-07 DIAGNOSIS — D631 Anemia in chronic kidney disease: Secondary | ICD-10-CM | POA: Diagnosis not present

## 2018-08-07 DIAGNOSIS — Z4932 Encounter for adequacy testing for peritoneal dialysis: Secondary | ICD-10-CM | POA: Diagnosis not present

## 2018-08-07 DIAGNOSIS — D509 Iron deficiency anemia, unspecified: Secondary | ICD-10-CM | POA: Diagnosis not present

## 2018-08-07 NOTE — Telephone Encounter (Signed)
Susan Horton called requesting a Vascular U/S Lower Extremity Duplex because the Transplant center in [place] is requesting that she said she has the order but need to be someone from here . Please. Call her at  [phone] Thank you .

## 2018-08-08 DIAGNOSIS — D631 Anemia in chronic kidney disease: Secondary | ICD-10-CM | POA: Diagnosis not present

## 2018-08-08 DIAGNOSIS — N2581 Secondary hyperparathyroidism of renal origin: Secondary | ICD-10-CM | POA: Diagnosis not present

## 2018-08-08 DIAGNOSIS — D509 Iron deficiency anemia, unspecified: Secondary | ICD-10-CM | POA: Diagnosis not present

## 2018-08-08 DIAGNOSIS — N186 End stage renal disease: Secondary | ICD-10-CM | POA: Diagnosis not present

## 2018-08-08 DIAGNOSIS — Z4932 Encounter for adequacy testing for peritoneal dialysis: Secondary | ICD-10-CM | POA: Diagnosis not present

## 2018-08-08 NOTE — Telephone Encounter (Signed)
Patient was called and a voicemail was left informing patient to return phone call. 

## 2018-08-09 DIAGNOSIS — D509 Iron deficiency anemia, unspecified: Secondary | ICD-10-CM | POA: Diagnosis not present

## 2018-08-09 DIAGNOSIS — N2581 Secondary hyperparathyroidism of renal origin: Secondary | ICD-10-CM | POA: Diagnosis not present

## 2018-08-09 DIAGNOSIS — Z4932 Encounter for adequacy testing for peritoneal dialysis: Secondary | ICD-10-CM | POA: Diagnosis not present

## 2018-08-09 DIAGNOSIS — D631 Anemia in chronic kidney disease: Secondary | ICD-10-CM | POA: Diagnosis not present

## 2018-08-09 DIAGNOSIS — N186 End stage renal disease: Secondary | ICD-10-CM | POA: Diagnosis not present

## 2018-08-10 DIAGNOSIS — N186 End stage renal disease: Secondary | ICD-10-CM | POA: Diagnosis not present

## 2018-08-10 DIAGNOSIS — D509 Iron deficiency anemia, unspecified: Secondary | ICD-10-CM | POA: Diagnosis not present

## 2018-08-10 DIAGNOSIS — D631 Anemia in chronic kidney disease: Secondary | ICD-10-CM | POA: Diagnosis not present

## 2018-08-10 DIAGNOSIS — Z4932 Encounter for adequacy testing for peritoneal dialysis: Secondary | ICD-10-CM | POA: Diagnosis not present

## 2018-08-10 DIAGNOSIS — N2581 Secondary hyperparathyroidism of renal origin: Secondary | ICD-10-CM | POA: Diagnosis not present

## 2018-08-11 ENCOUNTER — Telehealth (HOSPITAL_COMMUNITY): Payer: Self-pay

## 2018-08-11 ENCOUNTER — Other Ambulatory Visit: Payer: Self-pay | Admitting: Vascular Surgery

## 2018-08-11 DIAGNOSIS — D631 Anemia in chronic kidney disease: Secondary | ICD-10-CM | POA: Diagnosis not present

## 2018-08-11 DIAGNOSIS — Z4932 Encounter for adequacy testing for peritoneal dialysis: Secondary | ICD-10-CM | POA: Diagnosis not present

## 2018-08-11 DIAGNOSIS — N186 End stage renal disease: Secondary | ICD-10-CM | POA: Diagnosis not present

## 2018-08-11 DIAGNOSIS — D509 Iron deficiency anemia, unspecified: Secondary | ICD-10-CM | POA: Diagnosis not present

## 2018-08-11 DIAGNOSIS — M25569 Pain in unspecified knee: Secondary | ICD-10-CM

## 2018-08-11 DIAGNOSIS — N2581 Secondary hyperparathyroidism of renal origin: Secondary | ICD-10-CM | POA: Diagnosis not present

## 2018-08-11 NOTE — Telephone Encounter (Signed)
The above patient or their representative was contacted and gave the following answers to these questions:         Do you have any of the following symptoms? No  Fever                    Cough                   Shortness of breath  Do  you have any of the following other symptoms?  No   muscle pain         vomiting,        diarrhea        rash         weakness        red eye        abdominal pain         bruising          bruising or bleeding              joint pain           severe headache    Have you been in contact with someone who was or has been sick in the past 2 weeks? No  Yes                 Unsure                         Unable to assess   Does the person that you were in contact with have any of the following symptoms?  N/A  Cough         shortness of breath           muscle pain         vomiting,            diarrhea            rash            weakness           fever            red eye           abdominal pain           bruising  or  bleeding                joint pain                severe headache              COMMENTS OR ACTION PLAN FOR THIS PATIENT:

## 2018-08-12 DIAGNOSIS — E1129 Type 2 diabetes mellitus with other diabetic kidney complication: Secondary | ICD-10-CM | POA: Diagnosis not present

## 2018-08-12 DIAGNOSIS — N186 End stage renal disease: Secondary | ICD-10-CM | POA: Diagnosis not present

## 2018-08-12 DIAGNOSIS — Z79899 Other long term (current) drug therapy: Secondary | ICD-10-CM | POA: Diagnosis not present

## 2018-08-12 DIAGNOSIS — D631 Anemia in chronic kidney disease: Secondary | ICD-10-CM | POA: Diagnosis not present

## 2018-08-12 DIAGNOSIS — N2581 Secondary hyperparathyroidism of renal origin: Secondary | ICD-10-CM | POA: Diagnosis not present

## 2018-08-12 DIAGNOSIS — Z992 Dependence on renal dialysis: Secondary | ICD-10-CM | POA: Diagnosis not present

## 2018-08-12 DIAGNOSIS — D509 Iron deficiency anemia, unspecified: Secondary | ICD-10-CM | POA: Diagnosis not present

## 2018-08-12 DIAGNOSIS — D689 Coagulation defect, unspecified: Secondary | ICD-10-CM | POA: Diagnosis not present

## 2018-08-12 DIAGNOSIS — E1122 Type 2 diabetes mellitus with diabetic chronic kidney disease: Secondary | ICD-10-CM | POA: Diagnosis not present

## 2018-08-13 DIAGNOSIS — Z992 Dependence on renal dialysis: Secondary | ICD-10-CM | POA: Diagnosis not present

## 2018-08-13 DIAGNOSIS — D689 Coagulation defect, unspecified: Secondary | ICD-10-CM | POA: Diagnosis not present

## 2018-08-13 DIAGNOSIS — Z79899 Other long term (current) drug therapy: Secondary | ICD-10-CM | POA: Diagnosis not present

## 2018-08-13 DIAGNOSIS — N186 End stage renal disease: Secondary | ICD-10-CM | POA: Diagnosis not present

## 2018-08-13 DIAGNOSIS — D631 Anemia in chronic kidney disease: Secondary | ICD-10-CM | POA: Diagnosis not present

## 2018-08-13 DIAGNOSIS — D509 Iron deficiency anemia, unspecified: Secondary | ICD-10-CM | POA: Diagnosis not present

## 2018-08-14 ENCOUNTER — Ambulatory Visit (HOSPITAL_COMMUNITY)
Admission: RE | Admit: 2018-08-14 | Discharge: 2018-08-14 | Disposition: A | Discharge status: Home / Self Care | Payer: Medicare Other | Source: Ambulatory Visit | Attending: Family | Admitting: Family

## 2018-08-14 ENCOUNTER — Ambulatory Visit (INDEPENDENT_AMBULATORY_CARE_PROVIDER_SITE_OTHER)
Admission: RE | Admit: 2018-08-14 | Discharge: 2018-08-14 | Disposition: A | Discharge status: Home / Self Care | Payer: Medicare Other | Source: Ambulatory Visit | Attending: Family | Admitting: Family

## 2018-08-14 ENCOUNTER — Other Ambulatory Visit: Payer: Self-pay

## 2018-08-14 DIAGNOSIS — D509 Iron deficiency anemia, unspecified: Secondary | ICD-10-CM | POA: Diagnosis not present

## 2018-08-14 DIAGNOSIS — D689 Coagulation defect, unspecified: Secondary | ICD-10-CM | POA: Diagnosis not present

## 2018-08-14 DIAGNOSIS — D631 Anemia in chronic kidney disease: Secondary | ICD-10-CM | POA: Diagnosis not present

## 2018-08-14 DIAGNOSIS — M25569 Pain in unspecified knee: Secondary | ICD-10-CM

## 2018-08-14 DIAGNOSIS — N186 End stage renal disease: Secondary | ICD-10-CM | POA: Diagnosis not present

## 2018-08-14 DIAGNOSIS — Z79899 Other long term (current) drug therapy: Secondary | ICD-10-CM | POA: Diagnosis not present

## 2018-08-14 DIAGNOSIS — Z992 Dependence on renal dialysis: Secondary | ICD-10-CM | POA: Diagnosis not present

## 2018-08-15 DIAGNOSIS — N186 End stage renal disease: Secondary | ICD-10-CM | POA: Diagnosis not present

## 2018-08-15 DIAGNOSIS — D631 Anemia in chronic kidney disease: Secondary | ICD-10-CM | POA: Diagnosis not present

## 2018-08-15 DIAGNOSIS — D509 Iron deficiency anemia, unspecified: Secondary | ICD-10-CM | POA: Diagnosis not present

## 2018-08-15 DIAGNOSIS — D689 Coagulation defect, unspecified: Secondary | ICD-10-CM | POA: Diagnosis not present

## 2018-08-15 DIAGNOSIS — Z79899 Other long term (current) drug therapy: Secondary | ICD-10-CM | POA: Diagnosis not present

## 2018-08-15 DIAGNOSIS — Z992 Dependence on renal dialysis: Secondary | ICD-10-CM | POA: Diagnosis not present

## 2018-08-16 DIAGNOSIS — D689 Coagulation defect, unspecified: Secondary | ICD-10-CM | POA: Diagnosis not present

## 2018-08-16 DIAGNOSIS — N186 End stage renal disease: Secondary | ICD-10-CM | POA: Diagnosis not present

## 2018-08-16 DIAGNOSIS — R82998 Other abnormal findings in urine: Secondary | ICD-10-CM | POA: Diagnosis not present

## 2018-08-16 DIAGNOSIS — Z992 Dependence on renal dialysis: Secondary | ICD-10-CM | POA: Diagnosis not present

## 2018-08-16 DIAGNOSIS — D509 Iron deficiency anemia, unspecified: Secondary | ICD-10-CM | POA: Diagnosis not present

## 2018-08-16 DIAGNOSIS — Z79899 Other long term (current) drug therapy: Secondary | ICD-10-CM | POA: Diagnosis not present

## 2018-08-16 DIAGNOSIS — D631 Anemia in chronic kidney disease: Secondary | ICD-10-CM | POA: Diagnosis not present

## 2018-08-17 DIAGNOSIS — D689 Coagulation defect, unspecified: Secondary | ICD-10-CM | POA: Diagnosis not present

## 2018-08-17 DIAGNOSIS — D631 Anemia in chronic kidney disease: Secondary | ICD-10-CM | POA: Diagnosis not present

## 2018-08-17 DIAGNOSIS — Z992 Dependence on renal dialysis: Secondary | ICD-10-CM | POA: Diagnosis not present

## 2018-08-17 DIAGNOSIS — Z79899 Other long term (current) drug therapy: Secondary | ICD-10-CM | POA: Diagnosis not present

## 2018-08-17 DIAGNOSIS — D509 Iron deficiency anemia, unspecified: Secondary | ICD-10-CM | POA: Diagnosis not present

## 2018-08-17 DIAGNOSIS — N186 End stage renal disease: Secondary | ICD-10-CM | POA: Diagnosis not present

## 2018-08-18 DIAGNOSIS — D631 Anemia in chronic kidney disease: Secondary | ICD-10-CM | POA: Diagnosis not present

## 2018-08-18 DIAGNOSIS — Z79899 Other long term (current) drug therapy: Secondary | ICD-10-CM | POA: Diagnosis not present

## 2018-08-18 DIAGNOSIS — N186 End stage renal disease: Secondary | ICD-10-CM | POA: Diagnosis not present

## 2018-08-18 DIAGNOSIS — D689 Coagulation defect, unspecified: Secondary | ICD-10-CM | POA: Diagnosis not present

## 2018-08-18 DIAGNOSIS — Z992 Dependence on renal dialysis: Secondary | ICD-10-CM | POA: Diagnosis not present

## 2018-08-18 DIAGNOSIS — D509 Iron deficiency anemia, unspecified: Secondary | ICD-10-CM | POA: Diagnosis not present

## 2018-08-19 DIAGNOSIS — Z79899 Other long term (current) drug therapy: Secondary | ICD-10-CM | POA: Diagnosis not present

## 2018-08-19 DIAGNOSIS — D509 Iron deficiency anemia, unspecified: Secondary | ICD-10-CM | POA: Diagnosis not present

## 2018-08-19 DIAGNOSIS — D689 Coagulation defect, unspecified: Secondary | ICD-10-CM | POA: Diagnosis not present

## 2018-08-19 DIAGNOSIS — N186 End stage renal disease: Secondary | ICD-10-CM | POA: Diagnosis not present

## 2018-08-19 DIAGNOSIS — Z992 Dependence on renal dialysis: Secondary | ICD-10-CM | POA: Diagnosis not present

## 2018-08-19 DIAGNOSIS — D631 Anemia in chronic kidney disease: Secondary | ICD-10-CM | POA: Diagnosis not present

## 2018-08-20 DIAGNOSIS — N186 End stage renal disease: Secondary | ICD-10-CM | POA: Diagnosis not present

## 2018-08-20 DIAGNOSIS — D509 Iron deficiency anemia, unspecified: Secondary | ICD-10-CM | POA: Diagnosis not present

## 2018-08-20 DIAGNOSIS — Z992 Dependence on renal dialysis: Secondary | ICD-10-CM | POA: Diagnosis not present

## 2018-08-20 DIAGNOSIS — Z79899 Other long term (current) drug therapy: Secondary | ICD-10-CM | POA: Diagnosis not present

## 2018-08-20 DIAGNOSIS — D689 Coagulation defect, unspecified: Secondary | ICD-10-CM | POA: Diagnosis not present

## 2018-08-20 DIAGNOSIS — D631 Anemia in chronic kidney disease: Secondary | ICD-10-CM | POA: Diagnosis not present

## 2018-08-21 DIAGNOSIS — Z992 Dependence on renal dialysis: Secondary | ICD-10-CM | POA: Diagnosis not present

## 2018-08-21 DIAGNOSIS — D509 Iron deficiency anemia, unspecified: Secondary | ICD-10-CM | POA: Diagnosis not present

## 2018-08-21 DIAGNOSIS — N186 End stage renal disease: Secondary | ICD-10-CM | POA: Diagnosis not present

## 2018-08-21 DIAGNOSIS — D631 Anemia in chronic kidney disease: Secondary | ICD-10-CM | POA: Diagnosis not present

## 2018-08-21 DIAGNOSIS — D689 Coagulation defect, unspecified: Secondary | ICD-10-CM | POA: Diagnosis not present

## 2018-08-21 DIAGNOSIS — Z79899 Other long term (current) drug therapy: Secondary | ICD-10-CM | POA: Diagnosis not present

## 2018-08-22 DIAGNOSIS — N186 End stage renal disease: Secondary | ICD-10-CM | POA: Diagnosis not present

## 2018-08-22 DIAGNOSIS — D689 Coagulation defect, unspecified: Secondary | ICD-10-CM | POA: Diagnosis not present

## 2018-08-22 DIAGNOSIS — Z992 Dependence on renal dialysis: Secondary | ICD-10-CM | POA: Diagnosis not present

## 2018-08-22 DIAGNOSIS — D509 Iron deficiency anemia, unspecified: Secondary | ICD-10-CM | POA: Diagnosis not present

## 2018-08-22 DIAGNOSIS — Z79899 Other long term (current) drug therapy: Secondary | ICD-10-CM | POA: Diagnosis not present

## 2018-08-22 DIAGNOSIS — D631 Anemia in chronic kidney disease: Secondary | ICD-10-CM | POA: Diagnosis not present

## 2018-08-23 ENCOUNTER — Telehealth: Payer: Self-pay

## 2018-08-23 DIAGNOSIS — D509 Iron deficiency anemia, unspecified: Secondary | ICD-10-CM | POA: Diagnosis not present

## 2018-08-23 DIAGNOSIS — Z992 Dependence on renal dialysis: Secondary | ICD-10-CM | POA: Diagnosis not present

## 2018-08-23 DIAGNOSIS — N186 End stage renal disease: Secondary | ICD-10-CM | POA: Diagnosis not present

## 2018-08-23 DIAGNOSIS — Z79899 Other long term (current) drug therapy: Secondary | ICD-10-CM | POA: Diagnosis not present

## 2018-08-23 DIAGNOSIS — D631 Anemia in chronic kidney disease: Secondary | ICD-10-CM | POA: Diagnosis not present

## 2018-08-23 DIAGNOSIS — D689 Coagulation defect, unspecified: Secondary | ICD-10-CM | POA: Diagnosis not present

## 2018-08-23 NOTE — Telephone Encounter (Signed)
Let message advising patient to call back and give oral consent for a virtual visit or move her appt to a day later in the week.

## 2018-08-24 DIAGNOSIS — Z79899 Other long term (current) drug therapy: Secondary | ICD-10-CM | POA: Diagnosis not present

## 2018-08-24 DIAGNOSIS — D631 Anemia in chronic kidney disease: Secondary | ICD-10-CM | POA: Diagnosis not present

## 2018-08-24 DIAGNOSIS — N186 End stage renal disease: Secondary | ICD-10-CM | POA: Diagnosis not present

## 2018-08-24 DIAGNOSIS — Z992 Dependence on renal dialysis: Secondary | ICD-10-CM | POA: Diagnosis not present

## 2018-08-24 DIAGNOSIS — D689 Coagulation defect, unspecified: Secondary | ICD-10-CM | POA: Diagnosis not present

## 2018-08-24 DIAGNOSIS — D509 Iron deficiency anemia, unspecified: Secondary | ICD-10-CM | POA: Diagnosis not present

## 2018-08-25 ENCOUNTER — Other Ambulatory Visit: Payer: Self-pay | Admitting: Nephrology

## 2018-08-25 ENCOUNTER — Ambulatory Visit
Admission: RE | Admit: 2018-08-25 | Discharge: 2018-08-25 | Disposition: A | Discharge status: Home / Self Care | Payer: Medicare Other | Source: Ambulatory Visit | Attending: Nephrology | Admitting: Nephrology

## 2018-08-25 DIAGNOSIS — N186 End stage renal disease: Secondary | ICD-10-CM | POA: Diagnosis not present

## 2018-08-25 DIAGNOSIS — Z79899 Other long term (current) drug therapy: Secondary | ICD-10-CM | POA: Diagnosis not present

## 2018-08-25 DIAGNOSIS — K5900 Constipation, unspecified: Secondary | ICD-10-CM | POA: Diagnosis not present

## 2018-08-25 DIAGNOSIS — D689 Coagulation defect, unspecified: Secondary | ICD-10-CM | POA: Diagnosis not present

## 2018-08-25 DIAGNOSIS — Z992 Dependence on renal dialysis: Secondary | ICD-10-CM | POA: Diagnosis not present

## 2018-08-25 DIAGNOSIS — D631 Anemia in chronic kidney disease: Secondary | ICD-10-CM | POA: Diagnosis not present

## 2018-08-25 DIAGNOSIS — D509 Iron deficiency anemia, unspecified: Secondary | ICD-10-CM | POA: Diagnosis not present

## 2018-08-25 NOTE — Telephone Encounter (Signed)
.      Virtual Visit Pre-Appointment Phone Call  "Susan Horton, I am calling you today to discuss your upcoming appointment. We are currently trying to limit exposure to the virus that causes COVID-19 by seeing patients at home rather than in the office."  1. "What is the BEST phone number to call the day of the visit?" - include this in appointment notes  2. "Do you have or have access to (through a family member/friend) a smartphone with video capability that we can use for your visit?" a. If yes - list this number in appt notes as "cell" (if different from BEST phone #) and list the appointment type as a VIDEO visit in appointment notes b. If no - list the appointment type as a PHONE visit in appointment notes  3. Confirm consent - "In the setting of the current Covid19 crisis, you are scheduled for a VIDEO visit with your provider on 09/04/2018 at 3:00pm.  Just as we do with many in-office visits, in order for you to participate in this visit, we must obtain consent.  If you'd like, I can send this to your mychart (if signed up) or email for you to review.  Otherwise, I can obtain your verbal consent now.  All virtual visits are billed to your insurance company just like a normal visit would be.  By agreeing to a virtual visit, we'd like you to understand that the technology does not allow for your provider to perform an examination, and thus may limit your provider's ability to fully assess your condition. If your provider identifies any concerns that need to be evaluated in person, we will make arrangements to do so.  Finally, though the technology is pretty good, we cannot assure that it will always work on either your or our end, and in the setting of a video visit, we may have to convert it to a phone-only visit.  In either situation, we cannot ensure that we have a secure connection.  Are you willing to proceed?" STAFF: Did the patient verbally acknowledge consent to telehealth visit?  Document YES/NO here: YES  4. Advise patient to be prepared - "Two hours prior to your appointment, go ahead and check your blood pressure, pulse, oxygen saturation, and your weight (if you have the equipment to check those) and write them all down. When your visit starts, your provider will ask you for this information. If you have an Apple Watch or Kardia device, please plan to have heart rate information ready on the day of your appointment. Please have a pen and paper handy nearby the day of the visit as well."  5. Give patient instructions for MyChart download to smartphone OR [email or web address] as below if video visit (depending on what platform provider is using)  6. Inform patient they will receive a phone call 15 minutes prior to their appointment time (may be from unknown caller ID) so they should be prepared to answer    TELEPHONE CALL NOTE  Susan Horton has been deemed a candidate for a follow-up tele-health visit to limit community exposure during the Covid-19 pandemic. I spoke with the patient via phone to ensure availability of phone/video source, confirm preferred email & phone number, and discuss instructions and expectations.  I reminded Susan Horton to be prepared with any vital sign and/or heart rhythm information that could potentially be obtained via home monitoring, at the time of her visit. I reminded Susan Horton to expect a  phone call prior to her visit.  [name], CMA 08/25/2018 4:40 PM   INSTRUCTIONS FOR DOWNLOADING THE MYCHART APP TO SMARTPHONE  - The patient must first make sure to have activated MyChart and know their login information - If Apple, go to [employer] and type in MyChart in the search bar and download the app. If Android, ask patient to go to [employer] and type in [place] in the search bar and download the app. The app is free but as with any other app downloads, their phone may require them to verify saved payment information or  Apple/Android password.  - The patient will need to then log into the app with their MyChart username and password, and select [facility] as their healthcare provider to link the account. When it is time for your visit, go to the MyChart app, find appointments, and click Begin Video Visit. Be sure to Select Allow for your device to access the Microphone and Camera for your visit. You will then be connected, and your provider will be with you shortly.  **If they have any issues connecting, or need assistance please contact MyChart service desk [phone])**  **If using a computer, in order to ensure the best quality for their visit they will need to use either of the following Internet Browsers: [employer], or Google Chrome**  IF USING DOXIMITY or DOXY.ME - The patient will receive a link just prior to their visit by text.     FULL LENGTH CONSENT FOR TELE-HEALTH VISIT   I hereby voluntarily request, consent and authorize [facility] and its employed or contracted physicians, physician assistants, nurse practitioners or other licensed health care professionals (the Practitioner), to provide me with telemedicine health care services (the "Services") as deemed necessary by the treating Practitioner. I acknowledge and consent to receive the Services by the Practitioner via telemedicine. I understand that the telemedicine visit will involve communicating with the Practitioner through live audiovisual communication technology and the disclosure of certain medical information by electronic transmission. I acknowledge that I have been given the opportunity to request an in-person assessment or other available alternative prior to the telemedicine visit and am voluntarily participating in the telemedicine visit.  I understand that I have the right to withhold or withdraw my consent to the use of telemedicine in the course of my care at any time, without affecting my right to future care  or treatment, and that the Practitioner or I may terminate the telemedicine visit at any time. I understand that I have the right to inspect all information obtained and/or recorded in the course of the telemedicine visit and may receive copies of available information for a reasonable fee.  I understand that some of the potential risks of receiving the Services via telemedicine include:  [name] Delay or interruption in medical evaluation due to technological equipment failure or disruption; . Information transmitted may not be sufficient (e.g. poor resolution of images) to allow for appropriate medical decision making by the Practitioner; and/or  . In rare instances, security protocols could fail, causing a breach of personal health information.  Furthermore, I acknowledge that it is my responsibility to provide information about my medical history, conditions and care that is complete and accurate to the best of my ability. I acknowledge that Practitioner's advice, recommendations, and/or decision may be based on factors not within their control, such as incomplete or inaccurate data provided by me or distortions of diagnostic images or specimens that may  result from electronic transmissions. I understand that the practice of medicine is not an exact science and that Practitioner makes no warranties or guarantees regarding treatment outcomes. I acknowledge that I will receive a copy of this consent concurrently upon execution via email to the email address I last provided but may also request a printed copy by calling the office of [facility].    I understand that my insurance will be billed for this visit.   I have read or had this consent read to me. . I understand the contents of this consent, which adequately explains the benefits and risks of the Services being provided via telemedicine.  . I have been provided ample opportunity to ask questions regarding this consent and the Services and have had  my questions answered to my satisfaction. . I give my informed consent for the services to be provided through the use of telemedicine in my medical care  By participating in this telemedicine visit I agree to the above.

## 2018-08-25 NOTE — Telephone Encounter (Signed)
Called patient to inform her that due to some schedule changes Lurena Joiner will be virtual and not in the office. Patient states she is ok with a virtual visit. Discussed with patient what to expect and to get vitals 1 hour before appt. She voiced understanding.

## 2018-08-26 DIAGNOSIS — D689 Coagulation defect, unspecified: Secondary | ICD-10-CM | POA: Diagnosis not present

## 2018-08-26 DIAGNOSIS — D631 Anemia in chronic kidney disease: Secondary | ICD-10-CM | POA: Diagnosis not present

## 2018-08-26 DIAGNOSIS — N186 End stage renal disease: Secondary | ICD-10-CM | POA: Diagnosis not present

## 2018-08-26 DIAGNOSIS — Z79899 Other long term (current) drug therapy: Secondary | ICD-10-CM | POA: Diagnosis not present

## 2018-08-26 DIAGNOSIS — Z992 Dependence on renal dialysis: Secondary | ICD-10-CM | POA: Diagnosis not present

## 2018-08-26 DIAGNOSIS — D509 Iron deficiency anemia, unspecified: Secondary | ICD-10-CM | POA: Diagnosis not present

## 2018-08-27 DIAGNOSIS — Z992 Dependence on renal dialysis: Secondary | ICD-10-CM | POA: Diagnosis not present

## 2018-08-27 DIAGNOSIS — D631 Anemia in chronic kidney disease: Secondary | ICD-10-CM | POA: Diagnosis not present

## 2018-08-27 DIAGNOSIS — D509 Iron deficiency anemia, unspecified: Secondary | ICD-10-CM | POA: Diagnosis not present

## 2018-08-27 DIAGNOSIS — D689 Coagulation defect, unspecified: Secondary | ICD-10-CM | POA: Diagnosis not present

## 2018-08-27 DIAGNOSIS — Z79899 Other long term (current) drug therapy: Secondary | ICD-10-CM | POA: Diagnosis not present

## 2018-08-27 DIAGNOSIS — N186 End stage renal disease: Secondary | ICD-10-CM | POA: Diagnosis not present

## 2018-08-28 ENCOUNTER — Encounter (INDEPENDENT_AMBULATORY_CARE_PROVIDER_SITE_OTHER): Payer: Medicare Other | Admitting: Ophthalmology

## 2018-08-28 DIAGNOSIS — N186 End stage renal disease: Secondary | ICD-10-CM | POA: Diagnosis not present

## 2018-08-28 DIAGNOSIS — D631 Anemia in chronic kidney disease: Secondary | ICD-10-CM | POA: Diagnosis not present

## 2018-08-28 DIAGNOSIS — D689 Coagulation defect, unspecified: Secondary | ICD-10-CM | POA: Diagnosis not present

## 2018-08-28 DIAGNOSIS — Z992 Dependence on renal dialysis: Secondary | ICD-10-CM | POA: Diagnosis not present

## 2018-08-28 DIAGNOSIS — Z79899 Other long term (current) drug therapy: Secondary | ICD-10-CM | POA: Diagnosis not present

## 2018-08-28 DIAGNOSIS — D509 Iron deficiency anemia, unspecified: Secondary | ICD-10-CM | POA: Diagnosis not present

## 2018-08-29 DIAGNOSIS — D509 Iron deficiency anemia, unspecified: Secondary | ICD-10-CM | POA: Diagnosis not present

## 2018-08-29 DIAGNOSIS — Z992 Dependence on renal dialysis: Secondary | ICD-10-CM | POA: Diagnosis not present

## 2018-08-29 DIAGNOSIS — D631 Anemia in chronic kidney disease: Secondary | ICD-10-CM | POA: Diagnosis not present

## 2018-08-29 DIAGNOSIS — N186 End stage renal disease: Secondary | ICD-10-CM | POA: Diagnosis not present

## 2018-08-29 DIAGNOSIS — D689 Coagulation defect, unspecified: Secondary | ICD-10-CM | POA: Diagnosis not present

## 2018-08-29 DIAGNOSIS — Z79899 Other long term (current) drug therapy: Secondary | ICD-10-CM | POA: Diagnosis not present

## 2018-08-30 DIAGNOSIS — D631 Anemia in chronic kidney disease: Secondary | ICD-10-CM | POA: Diagnosis not present

## 2018-08-30 DIAGNOSIS — K3184 Gastroparesis: Secondary | ICD-10-CM | POA: Diagnosis not present

## 2018-08-30 DIAGNOSIS — N186 End stage renal disease: Secondary | ICD-10-CM | POA: Diagnosis not present

## 2018-08-30 DIAGNOSIS — E1143 Type 2 diabetes mellitus with diabetic autonomic (poly)neuropathy: Secondary | ICD-10-CM | POA: Diagnosis not present

## 2018-08-30 DIAGNOSIS — T85611A Breakdown (mechanical) of intraperitoneal dialysis catheter, initial encounter: Secondary | ICD-10-CM | POA: Diagnosis not present

## 2018-08-30 DIAGNOSIS — Z01818 Encounter for other preprocedural examination: Secondary | ICD-10-CM | POA: Diagnosis not present

## 2018-08-30 DIAGNOSIS — E1122 Type 2 diabetes mellitus with diabetic chronic kidney disease: Secondary | ICD-10-CM | POA: Diagnosis not present

## 2018-08-30 DIAGNOSIS — N183 Chronic kidney disease, stage 3 (moderate): Secondary | ICD-10-CM | POA: Diagnosis not present

## 2018-08-30 DIAGNOSIS — Z79899 Other long term (current) drug therapy: Secondary | ICD-10-CM | POA: Diagnosis not present

## 2018-08-30 DIAGNOSIS — D689 Coagulation defect, unspecified: Secondary | ICD-10-CM | POA: Diagnosis not present

## 2018-08-30 DIAGNOSIS — D649 Anemia, unspecified: Secondary | ICD-10-CM | POA: Diagnosis not present

## 2018-08-30 DIAGNOSIS — N189 Chronic kidney disease, unspecified: Secondary | ICD-10-CM | POA: Diagnosis not present

## 2018-08-30 DIAGNOSIS — Z862 Personal history of diseases of the blood and blood-forming organs and certain disorders involving the immune mechanism: Secondary | ICD-10-CM | POA: Diagnosis not present

## 2018-08-30 DIAGNOSIS — Z794 Long term (current) use of insulin: Secondary | ICD-10-CM | POA: Diagnosis not present

## 2018-08-30 DIAGNOSIS — D509 Iron deficiency anemia, unspecified: Secondary | ICD-10-CM | POA: Diagnosis not present

## 2018-08-30 DIAGNOSIS — E1165 Type 2 diabetes mellitus with hyperglycemia: Secondary | ICD-10-CM | POA: Diagnosis not present

## 2018-08-30 DIAGNOSIS — Z992 Dependence on renal dialysis: Secondary | ICD-10-CM | POA: Diagnosis not present

## 2018-08-31 DIAGNOSIS — Z79899 Other long term (current) drug therapy: Secondary | ICD-10-CM | POA: Diagnosis not present

## 2018-08-31 DIAGNOSIS — N186 End stage renal disease: Secondary | ICD-10-CM | POA: Diagnosis not present

## 2018-08-31 DIAGNOSIS — Z992 Dependence on renal dialysis: Secondary | ICD-10-CM | POA: Diagnosis not present

## 2018-08-31 DIAGNOSIS — D631 Anemia in chronic kidney disease: Secondary | ICD-10-CM | POA: Diagnosis not present

## 2018-08-31 DIAGNOSIS — D509 Iron deficiency anemia, unspecified: Secondary | ICD-10-CM | POA: Diagnosis not present

## 2018-08-31 DIAGNOSIS — D689 Coagulation defect, unspecified: Secondary | ICD-10-CM | POA: Diagnosis not present

## 2018-09-01 DIAGNOSIS — D509 Iron deficiency anemia, unspecified: Secondary | ICD-10-CM | POA: Diagnosis not present

## 2018-09-01 DIAGNOSIS — D631 Anemia in chronic kidney disease: Secondary | ICD-10-CM | POA: Diagnosis not present

## 2018-09-01 DIAGNOSIS — N186 End stage renal disease: Secondary | ICD-10-CM | POA: Diagnosis not present

## 2018-09-01 DIAGNOSIS — D689 Coagulation defect, unspecified: Secondary | ICD-10-CM | POA: Diagnosis not present

## 2018-09-01 DIAGNOSIS — Z79899 Other long term (current) drug therapy: Secondary | ICD-10-CM | POA: Diagnosis not present

## 2018-09-01 DIAGNOSIS — Z992 Dependence on renal dialysis: Secondary | ICD-10-CM | POA: Diagnosis not present

## 2018-09-02 DIAGNOSIS — Z79899 Other long term (current) drug therapy: Secondary | ICD-10-CM | POA: Diagnosis not present

## 2018-09-02 DIAGNOSIS — D509 Iron deficiency anemia, unspecified: Secondary | ICD-10-CM | POA: Diagnosis not present

## 2018-09-02 DIAGNOSIS — Z992 Dependence on renal dialysis: Secondary | ICD-10-CM | POA: Diagnosis not present

## 2018-09-02 DIAGNOSIS — D631 Anemia in chronic kidney disease: Secondary | ICD-10-CM | POA: Diagnosis not present

## 2018-09-02 DIAGNOSIS — D689 Coagulation defect, unspecified: Secondary | ICD-10-CM | POA: Diagnosis not present

## 2018-09-02 DIAGNOSIS — N186 End stage renal disease: Secondary | ICD-10-CM | POA: Diagnosis not present

## 2018-09-03 DIAGNOSIS — Z992 Dependence on renal dialysis: Secondary | ICD-10-CM | POA: Diagnosis not present

## 2018-09-03 DIAGNOSIS — D631 Anemia in chronic kidney disease: Secondary | ICD-10-CM | POA: Diagnosis not present

## 2018-09-03 DIAGNOSIS — N186 End stage renal disease: Secondary | ICD-10-CM | POA: Diagnosis not present

## 2018-09-03 DIAGNOSIS — D689 Coagulation defect, unspecified: Secondary | ICD-10-CM | POA: Diagnosis not present

## 2018-09-03 DIAGNOSIS — D509 Iron deficiency anemia, unspecified: Secondary | ICD-10-CM | POA: Diagnosis not present

## 2018-09-03 DIAGNOSIS — Z79899 Other long term (current) drug therapy: Secondary | ICD-10-CM | POA: Diagnosis not present

## 2018-09-04 ENCOUNTER — Telehealth: Payer: Self-pay

## 2018-09-04 ENCOUNTER — Other Ambulatory Visit: Payer: Self-pay

## 2018-09-04 ENCOUNTER — Telehealth (INDEPENDENT_AMBULATORY_CARE_PROVIDER_SITE_OTHER): Payer: Medicare Other | Admitting: Cardiology

## 2018-09-04 VITALS — BP 145/60 | HR 90 | Wt 177.9 lb

## 2018-09-04 DIAGNOSIS — I739 Peripheral vascular disease, unspecified: Secondary | ICD-10-CM | POA: Insufficient documentation

## 2018-09-04 DIAGNOSIS — I10 Essential (primary) hypertension: Secondary | ICD-10-CM

## 2018-09-04 DIAGNOSIS — D631 Anemia in chronic kidney disease: Secondary | ICD-10-CM | POA: Diagnosis not present

## 2018-09-04 DIAGNOSIS — D689 Coagulation defect, unspecified: Secondary | ICD-10-CM | POA: Diagnosis not present

## 2018-09-04 DIAGNOSIS — D509 Iron deficiency anemia, unspecified: Secondary | ICD-10-CM | POA: Diagnosis not present

## 2018-09-04 DIAGNOSIS — Z9861 Coronary angioplasty status: Secondary | ICD-10-CM

## 2018-09-04 DIAGNOSIS — Z79899 Other long term (current) drug therapy: Secondary | ICD-10-CM | POA: Diagnosis not present

## 2018-09-04 DIAGNOSIS — E785 Hyperlipidemia, unspecified: Secondary | ICD-10-CM

## 2018-09-04 DIAGNOSIS — Z992 Dependence on renal dialysis: Secondary | ICD-10-CM | POA: Diagnosis not present

## 2018-09-04 DIAGNOSIS — I2510 Atherosclerotic heart disease of native coronary artery without angina pectoris: Secondary | ICD-10-CM

## 2018-09-04 DIAGNOSIS — N186 End stage renal disease: Secondary | ICD-10-CM | POA: Diagnosis not present

## 2018-09-04 NOTE — Telephone Encounter (Signed)
Contacted patient to discuss AVS Instructions. Gave patient Luke's recommendations from today's virtual office visit. Informed patient that someone from the scheduling dept will be in contact with them to schedule their follow up appt. Patient voiced understanding and AVS mailed.

## 2018-09-04 NOTE — Progress Notes (Signed)
Virtual Visit via Telephone Note   This visit type was conducted due to national recommendations for restrictions regarding the COVID-19 Pandemic (e.g. social distancing) in an effort to limit this patient's exposure and mitigate transmission in our community.  Due to her co-morbid illnesses, this patient is at least at moderate risk for complications without adequate follow up.  This format is felt to be most appropriate for this patient at this time.  The patient did not have access to video technology/had technical difficulties with video requiring transitioning to audio format only (telephone).  All issues noted in this document were discussed and addressed.  No physical exam could be performed with this format.  Please refer to the patient's chart for her  consent to telehealth for [facility].   Date:  09/04/2018   ID:  Susan Horton, DOB 01-23-1970, MRN [id number]  Patient Location: Home Provider Location: Home  PCP:  Charlott Rakes, MD  Cardiologist:  Dr Gwenlyn Found Electrophysiologist:  None   Evaluation Performed:  Follow-Up Visit  Chief Complaint:  Pre op clearance  History of Present Illness:    Susan Horton is a 48 y.o. female with a history of coronary disease status post LAD DES July 6387, chronic diastolic heart failure, carotid and lower extremity arterial peripheral vascular disease, end-stage renal disease on dialysis, dyslipidemia, and hypertension.  Patient last saw Dr. Alvester Chou in March 2020.  She is actually done well from a cardiac standpoint since then, she denies having any chest pain or having to use nitroglycerin.  She was being evaluated for renal transplant in [facility] but is been turned down, she tells me because of her lower extremity arterial disease.  She also tells me she needs a AV fistula revision and ask about preop clearance for this.  The patient does not have symptoms concerning for COVID-19 infection (fever, chills, cough, or new shortness of  breath).    Past Medical History:  Diagnosis Date  . Anemia   . Angio-edema   . Asthma   . CAD (coronary artery disease)    DES to mid LAD July 2018, residual moderate RCA disease  . Cataract   . CKD (chronic kidney disease) stage 4, GFR 15-29 ml/min (HCC)    Dialysis T/Th/Sa  . DVT (deep venous thrombosis) ([place])    1996 during pregnancy, 2015 left leg  . Eczema   . Essential hypertension 12/11/2015  . Gastroparesis   . GERD (gastroesophageal reflux disease)   . Hidradenitis   . Migraine   . Neuropathy   . Peripheral vascular disease ([place])    blood clot in leg  . Type 2 diabetes mellitus ([place])   . Type II diabetes mellitus ([place])   . Urticaria    Past Surgical History:  Procedure Laterality Date  . A/V FISTULAGRAM N/A 06/22/2017   Procedure: A/V FISTULAGRAM - left arm;  Surgeon: [name], MD;  Location: [place] [facility];  Service: Cardiovascular;  Laterality: N/A;  . ABDOMINOPLASTY    . ADENOIDECTOMY    . APPENDECTOMY  1995  . AV FISTULA PLACEMENT Left 02/01/2017   Procedure: ARTERIOVENOUS BRACHIOCEPHALIC (AV) FISTULA CREATION;  Surgeon: [name], MD;  Location: [place];  Service: Vascular;  Laterality: Left;  . CHOLECYSTECTOMY  1993  . CORONARY ANGIOPLASTY WITH STENT PLACEMENT    . CORONARY STENT INTERVENTION N/A 08/05/2016   Procedure: Coronary Stent Intervention;  Surgeon: [name], MD;  Location: [place] [facility];  Service: Cardiovascular;  Laterality: N/A;  . EXPLORATORY LAPAROTOMY  08/14/2005   lysis of adhesions, drainage of tubo-ovarian abscess  . FISTULA SUPERFICIALIZATION Left 09/14/2017   Procedure: FISTULA SUPERFICIALIZATION ARTERIOVENOUS FISTULA LEFT ARM;  Surgeon: [name], MD;  Location: [place];  Service: Vascular;  Laterality: Left;  . HYDRADENITIS EXCISION  02/08/2011   Procedure: EXCISION HYDRADENITIS GROIN;  Surgeon: [name], MD;  Location: [place];  Service: General;  Laterality: N/A;   Excisioin of Hidradenitis Left groin  . INGUINAL HIDRADENITIS EXCISION  07/06/2010   bilateral  . INSERTION OF DIALYSIS CATHETER N/A 09/14/2017   Procedure: INSERTION OF TUNNELED DIALYSIS CATHETER Right Internal Jugular;  Surgeon: [name], MD;  Location: [place];  Service: Vascular;  Laterality: N/A;  . LEFT HEART CATH AND CORONARY ANGIOGRAPHY N/A 08/05/2016   Procedure: Left Heart Cath and Coronary Angiography;  Surgeon: [name], MD;  Location: [place] [facility];  Service: Cardiovascular;  Laterality: N/A;  . REDUCTION MAMMAPLASTY  2002  . TONSILLECTOMY    . TUBAL LIGATION  1996  . VAGINAL HYSTERECTOMY  08/04/2005   and cysto     Current Meds  Medication Sig  . albuterol (PROVENTIL HFA;VENTOLIN HFA) 108 (90 BASE) MCG/ACT inhaler Inhale 1-2 puffs into the lungs every 6 (six) hours as needed for wheezing or shortness of breath.   [name] aspirin 81 MG chewable tablet Chew 1 tablet (81 mg total) by mouth daily.  . calcium acetate (PHOSLO) 667 MG capsule TAKE 1 CAPSULE BY MOUTH THREE TIMES DAILY WITH MEALS AND 1 CAPSULE TWO TIMES DAILY WITH SNACKS  . capsicum (ZOSTRIX) 0.075 % topical cream Apply 1 application topically 2 (two) times daily.  . clopidogrel (PLAVIX) 75 MG tablet Take 1 tablet (75 mg total) by mouth daily with breakfast.  . Continuous Blood Gluc Sensor (FREESTYLE LIBRE 14 DAY SENSOR) MISC 1 each by Does not apply route every 14 (fourteen) days. Change every 2 weeks  . Ferrous Sulfate (IRON) 325 (65 Fe) MG TABS Take 325 mg by mouth daily.  . Fluticasone-Salmeterol (ADVAIR) 250-50 MCG/DOSE AEPB Inhale 1 puff into the lungs 2 (two) times daily. (Patient taking differently: Inhale 1 puff into the lungs daily as needed (shortness of breath). )  . Insulin Glargine, 1 Unit Dial, (TOUJEO SOLOSTAR) 300 UNIT/ML SOPN Inject 25 Units into the skin 2 (two) times a day.  . insulin regular (HUMULIN R) 100 units/mL injection Inject 0.2-0.25 mLs (20-25 Units total) into the skin See  admin instructions.  . isosorbide mononitrate (IMDUR) 30 MG 24 hr tablet Take 0.5 tablets (15 mg total) by mouth daily.  [name] lidocaine (XYLOCAINE) 5 % ointment Apply 1 application topically as needed.  . lidocaine-prilocaine (EMLA) cream APPLY SMALL AMOUNT TO ACCESS SITE (AVF) 30 MINUTES BEFORE DIALYSIS. COVER WITH OCCLUSIVE DRESSING (SARAN WRAP) THREE DAYS A WEEK (3 TIMES A  . lovastatin (MEVACOR) 20 MG tablet Take 20 mg by mouth at bedtime.   . methocarbamol (ROBAXIN) 500 MG tablet Take 1 tablet (500 mg total) by mouth 2 (two) times daily as needed for muscle spasms.  . metoCLOPramide (REGLAN) 5 MG tablet TAKE 1 TABLET BY MOUTH 4 TIMES DAILY BEFORE MEALS AND AT BEDTIME. (Patient taking differently: Take 5 mg by mouth 4 (four) times daily -  before meals and at bedtime. )  . metoprolol succinate (TOPROL-XL) 50 MG 24 hr tablet Take 1 tablet (50 mg total) by mouth daily.  . montelukast (SINGULAIR) 10 MG tablet Take 10 mg by  mouth daily as needed (allergies).   . montelukast (SINGULAIR) 10 MG tablet Take 1 tablet (10 mg total) by mouth at bedtime.  . multivitamin (RENA-VIT) TABS tablet Take 1 tablet by mouth at bedtime.  . nitroGLYCERIN (NITROSTAT) 0.4 MG SL tablet Place 1 tablet (0.4 mg total) under the tongue every 5 (five) minutes as needed for chest pain.  . pantoprazole (PROTONIX) 40 MG tablet TAKE 1 TABLET BY MOUTH DAILY.  [name] senna-docusate (SENOKOT-S) 8.6-50 MG tablet Take 1 tablet by mouth 2 (two) times daily. (Patient taking differently: Take 1 tablet by mouth daily as needed for moderate constipation. )  . trimethoprim-polymyxin b (POLYTRIM) ophthalmic solution INSTILL ONE DROP INTO BOTH EYES 4 TIMES A DAY FOR 2 DAYS AFTER EACH MONTHLY EYE INJECTION     Allergies:   Penicillins, Repatha [evolocumab], and Lisinopril   Social History   Tobacco Use  . Smoking status: Former Smoker    Years: 20.00    Types: Cigarettes    Quit date: 11/2016    Years since quitting: 1.8  . Smokeless  tobacco: Never Used  Substance Use Topics  . Alcohol use: No    Alcohol/week: 0.0 standard drinks  . Drug use: No     Family Hx: The patient's family history includes Allergic rhinitis in her brother, father, mother, and sister; Breast cancer in her cousin; Hypertension in her brother, father, and sister.  ROS:   Please see the history of present illness.    All other systems reviewed and are negative.   Prior CV studies:   The following studies were reviewed today:   Labs/Other Tests and Data Reviewed:    EKG:  No ECG reviewed.  Recent Labs: 09/14/2017: Hemoglobin 13.6; Potassium 4.1; Sodium 141 04/12/2018: TSH 2.870   Recent Lipid Panel Lab Results  Component Value Date/Time   CHOL 248 (H) 07/21/2017 09:59 AM   TRIG 248 (H) 07/21/2017 09:59 AM   HDL 33 (L) 07/21/2017 09:59 AM   CHOLHDL 7.5 (H) 07/21/2017 09:59 AM   CHOLHDL 7.2 Ratio 01/01/2010 08:55 PM   LDLCALC 165 (H) 07/21/2017 09:59 AM    Wt Readings from Last 3 Encounters:  09/04/18 177 lb 14.6 oz (80.7 kg)  05/22/18 186 lb (84.4 kg)  04/12/18 172 lb 6.4 oz (78.2 kg)     Objective:    Vital Signs:  BP (!) 145/60   Pulse 90   Wt 177 lb 14.6 oz (80.7 kg)   BMI 29.16 kg/m    VITAL SIGNS:  reviewed  ASSESSMENT & PLAN:    Pre op clearance Pt asked about pre op clearance for AVF repair.  She would be an acceptable risk for this procedure without further cardiac workup. Her DES was > one year ago and she should be able to hold Plavix 5-7 days pre op.  I did ask her to ask her surgeon's office to send a formal request for clearance.  CAD- The patient had an LAD DES placed in July 2018.  She did have moderate residual right carotid disease that was being treated medically.  She is not had recent angina.  PVD- The patient has mild to moderate carotid disease by Doppler July 2020.  LE a Dopplers done 08/14/2018 showed a 50 to 74% right SFA narrowing and a 75 to 99% left SFA narrowing.  ESRD- Patient is on  dialysis  Dyslipidemia- The patient was unable to tolerate statin or Repatha therapy   COVID-19 Education: The signs and symptoms of COVID-19 were discussed  with the patient and how to seek care for testing (follow up with PCP or arrange E-visit).  The importance of social distancing was discussed today.  Time:   Today, I have spent 20 minutes with the patient with telehealth technology discussing the above problems.     Medication Adjustments/Labs and Tests Ordered: Current medicines are reviewed at length with the patient today.  Concerns regarding medicines are outlined above.   Tests Ordered: No orders of the defined types were placed in this encounter.   Medication Changes: No orders of the defined types were placed in this encounter.   Follow Up:  In Person Dr Truitt Leep months  Signed, Kerin Ransom, [place]  09/04/2018 2:36 PM    [facility]

## 2018-09-04 NOTE — Telephone Encounter (Signed)
Opened in error

## 2018-09-04 NOTE — Patient Instructions (Signed)
Medication Instructions:  Your physician recommends that you continue on your current medications as directed. Please refer to the Current Medication list given to you today.  If you need a refill on your cardiac medications before your next appointment, please call your pharmacy.   Lab work: None  If you have labs (blood work) drawn today and your tests are completely normal, you will receive your results only by: . MyChart Message (if you have MyChart) OR . A paper copy in the mail If you have any lab test that is abnormal or we need to change your treatment, we will call you to review the results.  Testing/Procedures: None   Follow-Up: At [facility], you and your health needs are our priority.  As part of our continuing mission to provide you with exceptional heart care, we have created designated Provider Care Teams.  These Care Teams include your primary Cardiologist (physician) and Advanced Practice Providers (APPs -  Physician Assistants and Nurse Practitioners) who all work together to provide you with the care you need, when you need it. You will need a follow up appointment in 6 months.  Please call our office 2 months in advance to schedule this appointment.  You may see Jonathan Berry, MD or one of the following Advanced Practice Providers on your designated Care Team:   Luke Kilroy, PA-C Krista Kroeger, PA-C . Callie Goodrich, PA-C  Any Other Special Instructions Will Be Listed Below (If Applicable).   

## 2018-09-05 DIAGNOSIS — Z992 Dependence on renal dialysis: Secondary | ICD-10-CM | POA: Diagnosis not present

## 2018-09-05 DIAGNOSIS — N186 End stage renal disease: Secondary | ICD-10-CM | POA: Diagnosis not present

## 2018-09-05 DIAGNOSIS — K660 Peritoneal adhesions (postprocedural) (postinfection): Secondary | ICD-10-CM | POA: Diagnosis not present

## 2018-09-05 DIAGNOSIS — D689 Coagulation defect, unspecified: Secondary | ICD-10-CM | POA: Diagnosis not present

## 2018-09-05 DIAGNOSIS — E1122 Type 2 diabetes mellitus with diabetic chronic kidney disease: Secondary | ICD-10-CM | POA: Diagnosis not present

## 2018-09-05 DIAGNOSIS — Z79899 Other long term (current) drug therapy: Secondary | ICD-10-CM | POA: Diagnosis not present

## 2018-09-05 DIAGNOSIS — Z862 Personal history of diseases of the blood and blood-forming organs and certain disorders involving the immune mechanism: Secondary | ICD-10-CM | POA: Diagnosis not present

## 2018-09-05 DIAGNOSIS — Z794 Long term (current) use of insulin: Secondary | ICD-10-CM | POA: Diagnosis not present

## 2018-09-05 DIAGNOSIS — Z20828 Contact with and (suspected) exposure to other viral communicable diseases: Secondary | ICD-10-CM | POA: Diagnosis not present

## 2018-09-05 DIAGNOSIS — D509 Iron deficiency anemia, unspecified: Secondary | ICD-10-CM | POA: Diagnosis not present

## 2018-09-05 DIAGNOSIS — D631 Anemia in chronic kidney disease: Secondary | ICD-10-CM | POA: Diagnosis not present

## 2018-09-05 DIAGNOSIS — T85611A Breakdown (mechanical) of intraperitoneal dialysis catheter, initial encounter: Secondary | ICD-10-CM | POA: Diagnosis not present

## 2018-09-05 DIAGNOSIS — I10 Essential (primary) hypertension: Secondary | ICD-10-CM | POA: Diagnosis not present

## 2018-09-05 DIAGNOSIS — Z01812 Encounter for preprocedural laboratory examination: Secondary | ICD-10-CM | POA: Diagnosis not present

## 2018-09-06 DIAGNOSIS — N186 End stage renal disease: Secondary | ICD-10-CM | POA: Diagnosis not present

## 2018-09-06 DIAGNOSIS — D631 Anemia in chronic kidney disease: Secondary | ICD-10-CM | POA: Diagnosis not present

## 2018-09-06 DIAGNOSIS — Z79899 Other long term (current) drug therapy: Secondary | ICD-10-CM | POA: Diagnosis not present

## 2018-09-06 DIAGNOSIS — D689 Coagulation defect, unspecified: Secondary | ICD-10-CM | POA: Diagnosis not present

## 2018-09-06 DIAGNOSIS — D509 Iron deficiency anemia, unspecified: Secondary | ICD-10-CM | POA: Diagnosis not present

## 2018-09-06 DIAGNOSIS — Z992 Dependence on renal dialysis: Secondary | ICD-10-CM | POA: Diagnosis not present

## 2018-09-07 DIAGNOSIS — D631 Anemia in chronic kidney disease: Secondary | ICD-10-CM | POA: Diagnosis not present

## 2018-09-07 DIAGNOSIS — Z992 Dependence on renal dialysis: Secondary | ICD-10-CM | POA: Diagnosis not present

## 2018-09-07 DIAGNOSIS — N186 End stage renal disease: Secondary | ICD-10-CM | POA: Diagnosis not present

## 2018-09-07 DIAGNOSIS — Z79899 Other long term (current) drug therapy: Secondary | ICD-10-CM | POA: Diagnosis not present

## 2018-09-07 DIAGNOSIS — D509 Iron deficiency anemia, unspecified: Secondary | ICD-10-CM | POA: Diagnosis not present

## 2018-09-07 DIAGNOSIS — D689 Coagulation defect, unspecified: Secondary | ICD-10-CM | POA: Diagnosis not present

## 2018-09-08 DIAGNOSIS — D509 Iron deficiency anemia, unspecified: Secondary | ICD-10-CM | POA: Diagnosis not present

## 2018-09-08 DIAGNOSIS — D631 Anemia in chronic kidney disease: Secondary | ICD-10-CM | POA: Diagnosis not present

## 2018-09-08 DIAGNOSIS — N186 End stage renal disease: Secondary | ICD-10-CM | POA: Diagnosis not present

## 2018-09-08 DIAGNOSIS — D689 Coagulation defect, unspecified: Secondary | ICD-10-CM | POA: Diagnosis not present

## 2018-09-08 DIAGNOSIS — Z79899 Other long term (current) drug therapy: Secondary | ICD-10-CM | POA: Diagnosis not present

## 2018-09-08 DIAGNOSIS — Z992 Dependence on renal dialysis: Secondary | ICD-10-CM | POA: Diagnosis not present

## 2018-09-09 DIAGNOSIS — N186 End stage renal disease: Secondary | ICD-10-CM | POA: Diagnosis not present

## 2018-09-09 DIAGNOSIS — D689 Coagulation defect, unspecified: Secondary | ICD-10-CM | POA: Diagnosis not present

## 2018-09-09 DIAGNOSIS — D509 Iron deficiency anemia, unspecified: Secondary | ICD-10-CM | POA: Diagnosis not present

## 2018-09-09 DIAGNOSIS — D631 Anemia in chronic kidney disease: Secondary | ICD-10-CM | POA: Diagnosis not present

## 2018-09-09 DIAGNOSIS — Z79899 Other long term (current) drug therapy: Secondary | ICD-10-CM | POA: Diagnosis not present

## 2018-09-09 DIAGNOSIS — Z992 Dependence on renal dialysis: Secondary | ICD-10-CM | POA: Diagnosis not present

## 2018-09-10 DIAGNOSIS — D509 Iron deficiency anemia, unspecified: Secondary | ICD-10-CM | POA: Diagnosis not present

## 2018-09-10 DIAGNOSIS — Z992 Dependence on renal dialysis: Secondary | ICD-10-CM | POA: Diagnosis not present

## 2018-09-10 DIAGNOSIS — N186 End stage renal disease: Secondary | ICD-10-CM | POA: Diagnosis not present

## 2018-09-10 DIAGNOSIS — D631 Anemia in chronic kidney disease: Secondary | ICD-10-CM | POA: Diagnosis not present

## 2018-09-10 DIAGNOSIS — D689 Coagulation defect, unspecified: Secondary | ICD-10-CM | POA: Diagnosis not present

## 2018-09-10 DIAGNOSIS — Z79899 Other long term (current) drug therapy: Secondary | ICD-10-CM | POA: Diagnosis not present

## 2018-09-11 DIAGNOSIS — Z992 Dependence on renal dialysis: Secondary | ICD-10-CM | POA: Diagnosis not present

## 2018-09-11 DIAGNOSIS — Z79899 Other long term (current) drug therapy: Secondary | ICD-10-CM | POA: Diagnosis not present

## 2018-09-11 DIAGNOSIS — D509 Iron deficiency anemia, unspecified: Secondary | ICD-10-CM | POA: Diagnosis not present

## 2018-09-11 DIAGNOSIS — D689 Coagulation defect, unspecified: Secondary | ICD-10-CM | POA: Diagnosis not present

## 2018-09-11 DIAGNOSIS — N186 End stage renal disease: Secondary | ICD-10-CM | POA: Diagnosis not present

## 2018-09-11 DIAGNOSIS — D631 Anemia in chronic kidney disease: Secondary | ICD-10-CM | POA: Diagnosis not present

## 2018-09-12 DIAGNOSIS — K660 Peritoneal adhesions (postprocedural) (postinfection): Secondary | ICD-10-CM | POA: Diagnosis not present

## 2018-09-12 DIAGNOSIS — Z4902 Encounter for fitting and adjustment of peritoneal dialysis catheter: Secondary | ICD-10-CM | POA: Diagnosis not present

## 2018-09-12 DIAGNOSIS — Z862 Personal history of diseases of the blood and blood-forming organs and certain disorders involving the immune mechanism: Secondary | ICD-10-CM | POA: Diagnosis not present

## 2018-09-12 DIAGNOSIS — D631 Anemia in chronic kidney disease: Secondary | ICD-10-CM | POA: Diagnosis not present

## 2018-09-12 DIAGNOSIS — E1122 Type 2 diabetes mellitus with diabetic chronic kidney disease: Secondary | ICD-10-CM | POA: Diagnosis not present

## 2018-09-12 DIAGNOSIS — E1129 Type 2 diabetes mellitus with other diabetic kidney complication: Secondary | ICD-10-CM | POA: Diagnosis not present

## 2018-09-12 DIAGNOSIS — N186 End stage renal disease: Secondary | ICD-10-CM | POA: Diagnosis not present

## 2018-09-12 DIAGNOSIS — I10 Essential (primary) hypertension: Secondary | ICD-10-CM | POA: Diagnosis not present

## 2018-09-12 DIAGNOSIS — N2581 Secondary hyperparathyroidism of renal origin: Secondary | ICD-10-CM | POA: Diagnosis not present

## 2018-09-12 DIAGNOSIS — D509 Iron deficiency anemia, unspecified: Secondary | ICD-10-CM | POA: Diagnosis not present

## 2018-09-12 DIAGNOSIS — Z01810 Encounter for preprocedural cardiovascular examination: Secondary | ICD-10-CM | POA: Diagnosis not present

## 2018-09-12 DIAGNOSIS — Z794 Long term (current) use of insulin: Secondary | ICD-10-CM | POA: Diagnosis not present

## 2018-09-12 DIAGNOSIS — T85611A Breakdown (mechanical) of intraperitoneal dialysis catheter, initial encounter: Secondary | ICD-10-CM | POA: Diagnosis not present

## 2018-09-12 DIAGNOSIS — Z79899 Other long term (current) drug therapy: Secondary | ICD-10-CM | POA: Diagnosis not present

## 2018-09-12 DIAGNOSIS — Z992 Dependence on renal dialysis: Secondary | ICD-10-CM | POA: Diagnosis not present

## 2018-09-13 DIAGNOSIS — I10 Essential (primary) hypertension: Secondary | ICD-10-CM | POA: Diagnosis not present

## 2018-09-13 DIAGNOSIS — K660 Peritoneal adhesions (postprocedural) (postinfection): Secondary | ICD-10-CM | POA: Diagnosis not present

## 2018-09-13 DIAGNOSIS — D509 Iron deficiency anemia, unspecified: Secondary | ICD-10-CM | POA: Diagnosis not present

## 2018-09-13 DIAGNOSIS — Z79899 Other long term (current) drug therapy: Secondary | ICD-10-CM | POA: Diagnosis not present

## 2018-09-13 DIAGNOSIS — D631 Anemia in chronic kidney disease: Secondary | ICD-10-CM | POA: Diagnosis not present

## 2018-09-13 DIAGNOSIS — Z992 Dependence on renal dialysis: Secondary | ICD-10-CM | POA: Diagnosis not present

## 2018-09-13 DIAGNOSIS — N186 End stage renal disease: Secondary | ICD-10-CM | POA: Diagnosis not present

## 2018-09-13 DIAGNOSIS — Z794 Long term (current) use of insulin: Secondary | ICD-10-CM | POA: Diagnosis not present

## 2018-09-13 DIAGNOSIS — T85611A Breakdown (mechanical) of intraperitoneal dialysis catheter, initial encounter: Secondary | ICD-10-CM | POA: Diagnosis not present

## 2018-09-13 DIAGNOSIS — E1122 Type 2 diabetes mellitus with diabetic chronic kidney disease: Secondary | ICD-10-CM | POA: Diagnosis not present

## 2018-09-14 ENCOUNTER — Ambulatory Visit: Payer: Medicare Other | Admitting: Internal Medicine

## 2018-09-14 DIAGNOSIS — N186 End stage renal disease: Secondary | ICD-10-CM | POA: Diagnosis not present

## 2018-09-14 DIAGNOSIS — D509 Iron deficiency anemia, unspecified: Secondary | ICD-10-CM | POA: Diagnosis not present

## 2018-09-14 DIAGNOSIS — D631 Anemia in chronic kidney disease: Secondary | ICD-10-CM | POA: Diagnosis not present

## 2018-09-14 DIAGNOSIS — Z992 Dependence on renal dialysis: Secondary | ICD-10-CM | POA: Diagnosis not present

## 2018-09-14 DIAGNOSIS — R82998 Other abnormal findings in urine: Secondary | ICD-10-CM | POA: Diagnosis not present

## 2018-09-14 DIAGNOSIS — Z79899 Other long term (current) drug therapy: Secondary | ICD-10-CM | POA: Diagnosis not present

## 2018-09-14 NOTE — Patient Instructions (Signed)
Please continue: - Toujeo 25 units 2x a day - R insulin 15-25 units before meals  Please return in 3 months with your sugar log.

## 2018-09-14 NOTE — Progress Notes (Signed)
     C  A N  C  E L  L  E  D          

## 2018-09-15 DIAGNOSIS — Z992 Dependence on renal dialysis: Secondary | ICD-10-CM | POA: Diagnosis not present

## 2018-09-15 DIAGNOSIS — Z79899 Other long term (current) drug therapy: Secondary | ICD-10-CM | POA: Diagnosis not present

## 2018-09-15 DIAGNOSIS — N186 End stage renal disease: Secondary | ICD-10-CM | POA: Diagnosis not present

## 2018-09-15 DIAGNOSIS — D631 Anemia in chronic kidney disease: Secondary | ICD-10-CM | POA: Diagnosis not present

## 2018-09-15 DIAGNOSIS — D509 Iron deficiency anemia, unspecified: Secondary | ICD-10-CM | POA: Diagnosis not present

## 2018-09-16 ENCOUNTER — Encounter (HOSPITAL_COMMUNITY): Payer: Self-pay

## 2018-09-16 ENCOUNTER — Emergency Department (HOSPITAL_COMMUNITY): Payer: Medicare Other

## 2018-09-16 ENCOUNTER — Observation Stay (HOSPITAL_COMMUNITY)
Admission: EM | Admit: 2018-09-16 | Discharge: 2018-09-17 | Disposition: A | Discharge status: Home / Self Care | Payer: Medicare Other | Attending: Internal Medicine | Admitting: Internal Medicine

## 2018-09-16 DIAGNOSIS — Z7902 Long term (current) use of antithrombotics/antiplatelets: Secondary | ICD-10-CM | POA: Insufficient documentation

## 2018-09-16 DIAGNOSIS — D649 Anemia, unspecified: Secondary | ICD-10-CM | POA: Diagnosis present

## 2018-09-16 DIAGNOSIS — E1151 Type 2 diabetes mellitus with diabetic peripheral angiopathy without gangrene: Secondary | ICD-10-CM | POA: Diagnosis not present

## 2018-09-16 DIAGNOSIS — I2510 Atherosclerotic heart disease of native coronary artery without angina pectoris: Secondary | ICD-10-CM | POA: Insufficient documentation

## 2018-09-16 DIAGNOSIS — I5032 Chronic diastolic (congestive) heart failure: Secondary | ICD-10-CM | POA: Diagnosis present

## 2018-09-16 DIAGNOSIS — Z8249 Family history of ischemic heart disease and other diseases of the circulatory system: Secondary | ICD-10-CM | POA: Diagnosis not present

## 2018-09-16 DIAGNOSIS — Z20828 Contact with and (suspected) exposure to other viral communicable diseases: Secondary | ICD-10-CM | POA: Diagnosis not present

## 2018-09-16 DIAGNOSIS — Z87891 Personal history of nicotine dependence: Secondary | ICD-10-CM | POA: Diagnosis not present

## 2018-09-16 DIAGNOSIS — D631 Anemia in chronic kidney disease: Secondary | ICD-10-CM | POA: Diagnosis not present

## 2018-09-16 DIAGNOSIS — E1143 Type 2 diabetes mellitus with diabetic autonomic (poly)neuropathy: Secondary | ICD-10-CM | POA: Diagnosis not present

## 2018-09-16 DIAGNOSIS — Z7951 Long term (current) use of inhaled steroids: Secondary | ICD-10-CM | POA: Insufficient documentation

## 2018-09-16 DIAGNOSIS — I10 Essential (primary) hypertension: Secondary | ICD-10-CM | POA: Diagnosis present

## 2018-09-16 DIAGNOSIS — Z992 Dependence on renal dialysis: Secondary | ICD-10-CM | POA: Diagnosis not present

## 2018-09-16 DIAGNOSIS — Z79899 Other long term (current) drug therapy: Secondary | ICD-10-CM | POA: Insufficient documentation

## 2018-09-16 DIAGNOSIS — E1165 Type 2 diabetes mellitus with hyperglycemia: Secondary | ICD-10-CM | POA: Diagnosis not present

## 2018-09-16 DIAGNOSIS — Z955 Presence of coronary angioplasty implant and graft: Secondary | ICD-10-CM | POA: Diagnosis not present

## 2018-09-16 DIAGNOSIS — N186 End stage renal disease: Secondary | ICD-10-CM | POA: Insufficient documentation

## 2018-09-16 DIAGNOSIS — J4540 Moderate persistent asthma, uncomplicated: Secondary | ICD-10-CM | POA: Insufficient documentation

## 2018-09-16 DIAGNOSIS — K219 Gastro-esophageal reflux disease without esophagitis: Secondary | ICD-10-CM | POA: Insufficient documentation

## 2018-09-16 DIAGNOSIS — Z86718 Personal history of other venous thrombosis and embolism: Secondary | ICD-10-CM | POA: Insufficient documentation

## 2018-09-16 DIAGNOSIS — I132 Hypertensive heart and chronic kidney disease with heart failure and with stage 5 chronic kidney disease, or end stage renal disease: Secondary | ICD-10-CM | POA: Diagnosis not present

## 2018-09-16 DIAGNOSIS — Z880 Allergy status to penicillin: Secondary | ICD-10-CM | POA: Diagnosis not present

## 2018-09-16 DIAGNOSIS — Z7982 Long term (current) use of aspirin: Secondary | ICD-10-CM | POA: Insufficient documentation

## 2018-09-16 DIAGNOSIS — K3184 Gastroparesis: Secondary | ICD-10-CM | POA: Diagnosis present

## 2018-09-16 DIAGNOSIS — E785 Hyperlipidemia, unspecified: Secondary | ICD-10-CM | POA: Insufficient documentation

## 2018-09-16 DIAGNOSIS — E1122 Type 2 diabetes mellitus with diabetic chronic kidney disease: Secondary | ICD-10-CM | POA: Insufficient documentation

## 2018-09-16 DIAGNOSIS — D509 Iron deficiency anemia, unspecified: Secondary | ICD-10-CM | POA: Diagnosis not present

## 2018-09-16 DIAGNOSIS — E1142 Type 2 diabetes mellitus with diabetic polyneuropathy: Secondary | ICD-10-CM | POA: Diagnosis not present

## 2018-09-16 DIAGNOSIS — Z888 Allergy status to other drugs, medicaments and biological substances status: Secondary | ICD-10-CM | POA: Insufficient documentation

## 2018-09-16 DIAGNOSIS — Z9049 Acquired absence of other specified parts of digestive tract: Secondary | ICD-10-CM | POA: Diagnosis not present

## 2018-09-16 DIAGNOSIS — Z0184 Encounter for antibody response examination: Secondary | ICD-10-CM | POA: Insufficient documentation

## 2018-09-16 DIAGNOSIS — I5042 Chronic combined systolic (congestive) and diastolic (congestive) heart failure: Secondary | ICD-10-CM | POA: Diagnosis present

## 2018-09-16 DIAGNOSIS — Z794 Long term (current) use of insulin: Secondary | ICD-10-CM | POA: Diagnosis not present

## 2018-09-16 DIAGNOSIS — IMO0002 Reserved for concepts with insufficient information to code with codable children: Secondary | ICD-10-CM | POA: Diagnosis present

## 2018-09-16 DIAGNOSIS — R531 Weakness: Secondary | ICD-10-CM | POA: Diagnosis not present

## 2018-09-16 LAB — CBC WITH DIFFERENTIAL/PLATELET
Abs Immature Granulocytes: 0.07 10*3/uL (ref 0.00–0.07)
Basophils Absolute: 0 10*3/uL (ref 0.0–0.1)
Basophils Relative: 0 %
Eosinophils Absolute: 0.9 10*3/uL — ABNORMAL HIGH (ref 0.0–0.5)
Eosinophils Relative: 7 %
HCT: 23.4 % — ABNORMAL LOW (ref 36.0–46.0)
Hemoglobin: 6.8 g/dL — CL (ref 12.0–15.0)
Immature Granulocytes: 1 %
Lymphocytes Relative: 20 %
Lymphs Abs: 2.5 10*3/uL (ref 0.7–4.0)
MCH: 25.8 pg — ABNORMAL LOW (ref 26.0–34.0)
MCHC: 29.1 g/dL — ABNORMAL LOW (ref 30.0–36.0)
MCV: 88.6 fL (ref 80.0–100.0)
Monocytes Absolute: 0.7 10*3/uL (ref 0.1–1.0)
Monocytes Relative: 6 %
Neutro Abs: 8.3 10*3/uL — ABNORMAL HIGH (ref 1.7–7.7)
Neutrophils Relative %: 66 %
Platelets: 415 10*3/uL — ABNORMAL HIGH (ref 150–400)
RBC: 2.64 MIL/uL — ABNORMAL LOW (ref 3.87–5.11)
RDW: 14.1 % (ref 11.5–15.5)
WBC: 12.4 10*3/uL — ABNORMAL HIGH (ref 4.0–10.5)
nRBC: 0 % (ref 0.0–0.2)

## 2018-09-16 LAB — COMPREHENSIVE METABOLIC PANEL
ALT: 11 U/L (ref 0–44)
AST: 11 U/L — ABNORMAL LOW (ref 15–41)
Albumin: 2.7 g/dL — ABNORMAL LOW (ref 3.5–5.0)
Alkaline Phosphatase: 83 U/L (ref 38–126)
Anion gap: 11 (ref 5–15)
BUN: 49 mg/dL — ABNORMAL HIGH (ref 6–20)
CO2: 23 mmol/L (ref 22–32)
Calcium: 8.2 mg/dL — ABNORMAL LOW (ref 8.9–10.3)
Chloride: 102 mmol/L (ref 98–111)
Creatinine, Ser: 8.55 mg/dL — ABNORMAL HIGH (ref 0.44–1.00)
GFR calc Af Amer: 6 mL/min — ABNORMAL LOW (ref >60)
GFR calc non Af Amer: 5 mL/min — ABNORMAL LOW (ref >60)
Glucose, Bld: 166 mg/dL — ABNORMAL HIGH (ref 70–99)
Potassium: 4.4 mmol/L (ref 3.5–5.1)
Sodium: 136 mmol/L (ref 135–145)
Total Bilirubin: 0.4 mg/dL (ref 0.3–1.2)
Total Protein: 6.5 g/dL (ref 6.5–8.1)

## 2018-09-16 LAB — SARS CORONAVIRUS 2 BY RT PCR (HOSPITAL ORDER, PERFORMED IN ~~LOC~~ HOSPITAL LAB): SARS Coronavirus 2: NEGATIVE

## 2018-09-16 LAB — ABO/RH: ABO/RH(D): O POS

## 2018-09-16 LAB — PREPARE RBC (CROSSMATCH)

## 2018-09-16 MED ORDER — SENNOSIDES-DOCUSATE SODIUM 8.6-50 MG PO TABS: 1.0000 | ORAL_TABLET | Freq: Every day | ORAL | Status: DC | PRN

## 2018-09-16 MED ORDER — PANTOPRAZOLE SODIUM 40 MG PO TBEC
40.0000 mg | DELAYED_RELEASE_TABLET | Freq: Every day | ORAL | Status: DC
  Administered 2018-09-16 – 2018-09-17 (×2): 40 mg via ORAL
  Filled 2018-09-16 (×2): qty 1

## 2018-09-16 MED ORDER — INSULIN ASPART 100 UNIT/ML ~~LOC~~ SOLN: 0.0000 [IU] | Freq: Every day | SUBCUTANEOUS | Status: DC

## 2018-09-16 MED ORDER — TRAMADOL HCL 50 MG PO TABS
50.0000 mg | ORAL_TABLET | Freq: Once | ORAL | Status: AC
  Administered 2018-09-16: 50 mg via ORAL
  Filled 2018-09-16: qty 1

## 2018-09-16 MED ORDER — METHOCARBAMOL 500 MG PO TABS: 500.0000 mg | ORAL_TABLET | Freq: Two times a day (BID) | ORAL | Status: DC | PRN

## 2018-09-16 MED ORDER — ALBUTEROL SULFATE (2.5 MG/3ML) 0.083% IN NEBU: 3.0000 mL | INHALATION_SOLUTION | Freq: Four times a day (QID) | RESPIRATORY_TRACT | Status: DC | PRN

## 2018-09-16 MED ORDER — METOCLOPRAMIDE HCL 5 MG PO TABS
5.0000 mg | ORAL_TABLET | Freq: Three times a day (TID) | ORAL | Status: DC
  Administered 2018-09-16 – 2018-09-17 (×2): 5 mg via ORAL
  Filled 2018-09-16 (×3): qty 1

## 2018-09-16 MED ORDER — ACETAMINOPHEN 650 MG RE SUPP: 650.0000 mg | Freq: Four times a day (QID) | RECTAL | Status: DC | PRN

## 2018-09-16 MED ORDER — ALBUTEROL SULFATE HFA 108 (90 BASE) MCG/ACT IN AERS
1.0000 | INHALATION_SPRAY | Freq: Four times a day (QID) | RESPIRATORY_TRACT | Status: DC | PRN
  Filled 2018-09-16: qty 6.7

## 2018-09-16 MED ORDER — HEPARIN SODIUM (PORCINE) 5000 UNIT/ML IJ SOLN
5000.0000 [IU] | Freq: Three times a day (TID) | INTRAMUSCULAR | Status: DC
  Filled 2018-09-16 (×3): qty 1

## 2018-09-16 MED ORDER — INSULIN ASPART 100 UNIT/ML ~~LOC~~ SOLN: 0.0000 [IU] | Freq: Three times a day (TID) | SUBCUTANEOUS | Status: DC

## 2018-09-16 MED ORDER — FERROUS SULFATE 325 (65 FE) MG PO TABS
325.0000 mg | ORAL_TABLET | Freq: Every day | ORAL | Status: DC
  Administered 2018-09-17: 325 mg via ORAL
  Filled 2018-09-16: qty 1

## 2018-09-16 MED ORDER — PRAVASTATIN SODIUM 10 MG PO TABS
20.0000 mg | ORAL_TABLET | Freq: Every day | ORAL | Status: DC
  Filled 2018-09-16: qty 2

## 2018-09-16 MED ORDER — MONTELUKAST SODIUM 10 MG PO TABS: 10.0000 mg | ORAL_TABLET | Freq: Every day | ORAL | Status: DC | PRN

## 2018-09-16 MED ORDER — MOMETASONE FURO-FORMOTEROL FUM 200-5 MCG/ACT IN AERO
2.0000 | INHALATION_SPRAY | Freq: Two times a day (BID) | RESPIRATORY_TRACT | Status: DC
  Filled 2018-09-16: qty 8.8

## 2018-09-16 MED ORDER — INSULIN GLARGINE 100 UNIT/ML ~~LOC~~ SOLN
25.0000 [IU] | Freq: Two times a day (BID) | SUBCUTANEOUS | Status: DC
  Administered 2018-09-16 – 2018-09-17 (×2): 25 [IU] via SUBCUTANEOUS
  Filled 2018-09-16 (×3): qty 0.25

## 2018-09-16 MED ORDER — ACETAMINOPHEN 325 MG PO TABS: 650.0000 mg | ORAL_TABLET | Freq: Four times a day (QID) | ORAL | Status: DC | PRN

## 2018-09-16 MED ORDER — ISOSORBIDE MONONITRATE ER 30 MG PO TB24
15.0000 mg | ORAL_TABLET | Freq: Every day | ORAL | Status: DC
  Administered 2018-09-17: 15 mg via ORAL
  Filled 2018-09-16: qty 1

## 2018-09-16 MED ORDER — SODIUM CHLORIDE 0.9% IV SOLUTION: Freq: Once | INTRAVENOUS | Status: DC

## 2018-09-16 MED ORDER — MONTELUKAST SODIUM 10 MG PO TABS
10.0000 mg | ORAL_TABLET | Freq: Every day | ORAL | Status: DC
  Filled 2018-09-16: qty 1

## 2018-09-16 MED ORDER — ASPIRIN 81 MG PO CHEW
81.0000 mg | CHEWABLE_TABLET | Freq: Every day | ORAL | Status: DC
  Administered 2018-09-17: 81 mg via ORAL
  Filled 2018-09-16: qty 1

## 2018-09-16 MED ORDER — CLOPIDOGREL BISULFATE 75 MG PO TABS
75.0000 mg | ORAL_TABLET | Freq: Every day | ORAL | Status: DC
  Administered 2018-09-17: 75 mg via ORAL
  Filled 2018-09-16: qty 1

## 2018-09-16 MED ORDER — NITROGLYCERIN 0.4 MG SL SUBL: 0.4000 mg | SUBLINGUAL_TABLET | SUBLINGUAL | Status: DC | PRN

## 2018-09-16 MED ORDER — METOPROLOL SUCCINATE ER 50 MG PO TB24
50.0000 mg | ORAL_TABLET | Freq: Every day | ORAL | Status: DC
  Administered 2018-09-17: 50 mg via ORAL
  Filled 2018-09-16: qty 1

## 2018-09-16 NOTE — ED Notes (Signed)
ED TO INPATIENT HANDOFF REPORT  ED Nurse Name and Phone #: Lovell Sheehan [phone]  S Name/Age/Gender Susan Horton 48 y.o. female Room/Bed: 036C/036C  Code Status   Code Status: Full Code  Home/SNF/Other Home Patient oriented to: self, place, time and situation Is this baseline? Yes   Triage Complete: Triage complete  Chief Complaint post op surgery sob weakness blood count low  Triage Note Pt states that "the nurse at the kidney center told me to go to the hospital because my blood count was low. The doctor said I need a transfusion."  Pt usually has peritoneal dialysis, but reports having a blood clot in her small intestine nad needing a procedure to remove it.  Pt A&Ox4    Allergies Allergies  Allergen Reactions  . Penicillins Anaphylaxis, Itching, Swelling, Rash and Other (See Comments)    Swelling of throat & whole mouth  Has patient had a PCN reaction causing immediate rash, facial/tongue/throat swelling, SOB or lightheadedness with hypotension: Yes Has patient had a PCN reaction causing severe rash involving mucus membranes or skin necrosis: No Has patient had a PCN reaction that required hospitalization: No Has patient had a PCN reaction occurring within the last 10 years: No If all of the above answers are "NO", then may proceed with Cephalosporin use.  . Repatha [Evolocumab] Itching  . Lisinopril Cough    Level of Care/Admitting Diagnosis ED Disposition    ED Disposition Condition [facility] Area: [place] [[id number]]  Level of Care: Med-Surg [16]  I expect the patient will be discharged within 24 hours: Yes  LOW acuity---Tx typically complete <24 hrs---ACUTE conditions typically can be evaluated <24 hours---LABS likely to return to acceptable levels <24 hours---IS near functional baseline---EXPECTED to return to current living arrangement---NOT newly hypoxic: Meets criteria for 5C-Observation unit  Covid Evaluation: Asymptomatic  Screening Protocol (No Symptoms)  Diagnosis: Symptomatic anemia [4431540]  Admitting Physician: Darliss Cheney [[id number]]  Attending Physician: Darliss Cheney [[id number]]  PT Class (Do Not Modify): Observation [104]  PT Acc Code (Do Not Modify): Observation [10022]       B Medical/Surgery History Past Medical History:  Diagnosis Date  . Anemia   . Angio-edema   . Asthma   . CAD (coronary artery disease)    DES to mid LAD July 2018, residual moderate RCA disease  . Cataract   . CKD (chronic kidney disease) stage 4, GFR 15-29 ml/min (HCC)    Dialysis T/Th/Sa  . DVT (deep venous thrombosis) ([place])    1996 during pregnancy, 2015 left leg  . Eczema   . Essential hypertension 12/11/2015  . Gastroparesis   . GERD (gastroesophageal reflux disease)   . Hidradenitis   . Migraine   . Neuropathy   . Peripheral vascular disease ([place])    blood clot in leg  . Type 2 diabetes mellitus ([place])   . Type II diabetes mellitus ([place])   . Urticaria    Past Surgical History:  Procedure Laterality Date  . A/V FISTULAGRAM N/A 06/22/2017   Procedure: A/V FISTULAGRAM - left arm;  Surgeon: [name], MD;  Location: [facility] [facility];  Service: Cardiovascular;  Laterality: N/A;  . ABDOMINOPLASTY    . ADENOIDECTOMY    . APPENDECTOMY  1995  . AV FISTULA PLACEMENT Left 02/01/2017   Procedure: ARTERIOVENOUS BRACHIOCEPHALIC (AV) FISTULA CREATION;  Surgeon: [name], MD;  Location: [facility];  Service: Vascular;  Laterality: Left;  . CHOLECYSTECTOMY  1993  .  CORONARY ANGIOPLASTY WITH STENT PLACEMENT    . CORONARY STENT INTERVENTION N/A 08/05/2016   Procedure: Coronary Stent Intervention;  Surgeon: [name], MD;  Location: [facility];  Service: Cardiovascular;  Laterality: N/A;  . EXPLORATORY LAPAROTOMY  08/14/2005   lysis of adhesions, drainage of tubo-ovarian abscess  . FISTULA SUPERFICIALIZATION Left 09/14/2017   Procedure: FISTULA SUPERFICIALIZATION ARTERIOVENOUS FISTULA  LEFT ARM;  Surgeon: [name], MD;  Location: [facility];  Service: Vascular;  Laterality: Left;  . HYDRADENITIS EXCISION  02/08/2011   Procedure: EXCISION HYDRADENITIS GROIN;  Surgeon: [name], MD;  Location: [facility];  Service: General;  Laterality: N/A;  Excisioin of Hidradenitis Left groin  . INGUINAL HIDRADENITIS EXCISION  07/06/2010   bilateral  . INSERTION OF DIALYSIS CATHETER N/A 09/14/2017   Procedure: INSERTION OF TUNNELED DIALYSIS CATHETER Right Internal Jugular;  Surgeon: [name], MD;  Location: [place];  Service: Vascular;  Laterality: N/A;  . LEFT HEART CATH AND CORONARY ANGIOGRAPHY N/A 08/05/2016   Procedure: Left Heart Cath and Coronary Angiography;  Surgeon: [name], MD;  Location: [facility];  Service: Cardiovascular;  Laterality: N/A;  . REDUCTION MAMMAPLASTY  2002  . TONSILLECTOMY    . TUBAL LIGATION  1996  . VAGINAL HYSTERECTOMY  08/04/2005   and cysto     A IV Location/Drains/Wounds Patient Lines/Drains/Airways Status   Active Line/Drains/Airways    Name:   Placement date:   Placement time:   Site:   Days:   Fistula / Graft Left Upper arm Arteriovenous fistula   02/01/17    2342    Upper arm   592   Hemodialysis Catheter Right Internal jugular Permanent   09/14/17    1326    Internal jugular   367   Incision (Closed) 02/01/17 Arm Left   02/01/17    0840     592   Incision (Closed) 09/14/17 Neck Right   09/14/17    1328     367   Incision (Closed) 09/14/17 Arm Left   09/14/17    1405     367          Intake/Output Last 24 hours No intake or output data in the 24 hours ending 09/16/18 1837  Labs/Imaging Results for orders placed or performed during the hospital encounter of 09/16/18 (from the past 48 hour(s))  CBC with Differential     Status: Abnormal   Collection Time: 09/16/18  4:50 PM  Result Value Ref Range   WBC 12.4 (H) 4.0 - 10.5 K/uL   RBC 2.64 (L) 3.87 - 5.11 MIL/uL   Hemoglobin 6.8 (LL)  12.0 - 15.0 g/dL    Comment: REPEATED TO VERIFY THIS CRITICAL RESULT HAS VERIFIED AND BEEN CALLED TO RN E Dixon Luczak BY LESLIE BENFIELD ON 09 05 2020 AT 1726, AND HAS BEEN READ BACK.     HCT 23.4 (L) 36.0 - 46.0 %   MCV 88.6 80.0 - 100.0 fL   MCH 25.8 (L) 26.0 - 34.0 pg   MCHC 29.1 (L) 30.0 - 36.0 g/dL   RDW 14.1 11.5 - 15.5 %   Platelets 415 (H) 150 - 400 K/uL   nRBC 0.0 0.0 - 0.2 %   Neutrophils Relative % 66 %   Neutro Abs 8.3 (H) 1.7 - 7.7 K/uL   Lymphocytes Relative 20 %   Lymphs Abs 2.5 0.7 - 4.0 K/uL   Monocytes Relative 6 %   Monocytes Absolute 0.7  0.1 - 1.0 K/uL   Eosinophils Relative 7 %   Eosinophils Absolute 0.9 (H) 0.0 - 0.5 K/uL   Basophils Relative 0 %   Basophils Absolute 0.0 0.0 - 0.1 K/uL   Immature Granulocytes 1 %   Abs Immature Granulocytes 0.07 0.00 - 0.07 K/uL    Comment: Performed at [facility] [address] [place], [place] [address]  Comprehensive metabolic panel     Status: Abnormal   Collection Time: 09/16/18  4:50 PM  Result Value Ref Range   Sodium 136 135 - 145 mmol/L   Potassium 4.4 3.5 - 5.1 mmol/L   Chloride 102 98 - 111 mmol/L   CO2 23 22 - 32 mmol/L   Glucose, Bld 166 (H) 70 - 99 mg/dL   BUN 49 (H) 6 - 20 mg/dL   Creatinine, Ser 8.55 (H) 0.44 - 1.00 mg/dL   Calcium 8.2 (L) 8.9 - 10.3 mg/dL   Total Protein 6.5 6.5 - 8.1 g/dL   Albumin 2.7 (L) 3.5 - 5.0 g/dL   AST 11 (L) 15 - 41 U/L   ALT 11 0 - 44 U/L   Alkaline Phosphatase 83 38 - 126 U/L   Total Bilirubin 0.4 0.3 - 1.2 mg/dL   GFR calc non Af Amer 5 (L) >60 mL/min   GFR calc Af Amer 6 (L) >60 mL/min   Anion gap 11 5 - 15    Comment: Performed at [facility], [place] [address] [place], [place] [address]  ABO/Rh     Status: None   Collection Time: 09/16/18  4:54 PM  Result Value Ref Range   ABO/RH(D)      O POS Performed at [facility] [address], [place], [place] [address]   Type and screen [place]     Status: None (Preliminary  result)   Collection Time: 09/16/18  5:03 PM  Result Value Ref Range   ABO/RH(D) O POS    Antibody Screen NEG    Sample Expiration 09/19/2018,2359    Unit Number [id number]    Blood Component Type RED CELLS,LR    Unit division 00    Status of Unit ALLOCATED    Transfusion Status OK TO TRANSFUSE    Crossmatch Result      Compatible Performed at [facility], [place] [address], [place], [place] [address]   Prepare RBC     Status: None   Collection Time: 09/16/18  5:34 PM  Result Value Ref Range   Order Confirmation      ORDER PROCESSED BY BLOOD BANK Performed at [facility], [place] [address], [place],  [address]    Dg Chest Portable 1 View  Result Date: 09/16/2018 CLINICAL DATA:  Anemia, end-stage renal disease on dialysis, coronary artery disease, diabetes mellitus, former smoker EXAM: PORTABLE CHEST 1 VIEW COMPARISON:  Portable exam 1648 hours compared to 09/14/2017 FINDINGS: Borderline enlargement of cardiac silhouette. Mediastinal contours and pulmonary vascularity normal. Lungs clear. No pleural effusion or pneumothorax. Osseous structures unremarkable. IMPRESSION: No acute abnormalities. Electronically Signed   By: Lavonia Dana M.D.   On: 09/16/2018 16:58    Pending Labs Unresulted Labs (From admission, onward)    Start     Ordered   09/17/18 7106  Basic metabolic panel  Tomorrow morning,   R     09/16/18 1816   09/17/18 0500  CBC  Tomorrow morning,   R     09/16/18 1816  09/16/18 1817  HIV antibody (Routine Testing)  Once,   STAT     09/16/18 1816   09/16/18 1622  SARS Coronavirus 2 [facility] order, Performed in [facility] hospital lab) Nasopharyngeal Nasopharyngeal Swab  (Symptomatic/High Risk of Exposure/Tier 1 Patients Labs with Precautions)  Once,   STAT    Question Answer Comment  Is this test for diagnosis or screening Diagnosis of ill patient   Symptomatic for COVID-19 as defined by CDC No   Hospitalized for COVID-19 No   Admitted to ICU for  COVID-19 No   Previously tested for COVID-19 No   Resident in a congregate (group) care setting No   Employed in healthcare setting No   Pregnant No      09/16/18 1621          Vitals/Pain Today's Vitals   09/16/18 1606 09/16/18 1607 09/16/18 1615  BP: (!) 163/66  (!) 149/61  Pulse: (!) 106  100  Resp: 15  20  Temp: 99 F (37.2 C)    TempSrc: Oral    SpO2: 100%  100%  Weight:  81.2 kg   Height:  5\' 5"  (1.651 m)   PainSc:  0-No pain     Isolation Precautions Airborne and Contact precautions  Medications Medications  0.9 %  sodium chloride infusion (Manually program via Guardrails IV Fluids) (has no administration in time range)  aspirin chewable tablet 81 mg (has no administration in time range)  isosorbide mononitrate (IMDUR) 24 hr tablet 15 mg (has no administration in time range)  pravastatin (PRAVACHOL) tablet 20 mg (has no administration in time range)  metoprolol succinate (TOPROL-XL) 24 hr tablet 50 mg (has no administration in time range)  nitroGLYCERIN (NITROSTAT) SL tablet 0.4 mg (has no administration in time range)  Insulin Glargine (1 Unit Dial) SOPN 25 Units (has no administration in time range)  metoCLOPramide (REGLAN) tablet 5 mg (has no administration in time range)  pantoprazole (PROTONIX) EC tablet 40 mg (has no administration in time range)  senna-docusate (Senokot-S) tablet 1 tablet (has no administration in time range)  clopidogrel (PLAVIX) tablet 75 mg (has no administration in time range)  Iron TABS 325 mg (has no administration in time range)  methocarbamol (ROBAXIN) tablet 500 mg (has no administration in time range)  albuterol (VENTOLIN HFA) 108 (90 Base) MCG/ACT inhaler 1-2 puff (has no administration in time range)  mometasone-formoterol (DULERA) 200-5 MCG/ACT inhaler 2 puff (has no administration in time range)  montelukast (SINGULAIR) tablet 10 mg (has no administration in time range)  montelukast (SINGULAIR) tablet 10 mg (has no  administration in time range)  heparin injection 5,000 Units (has no administration in time range)  acetaminophen (TYLENOL) tablet 650 mg (has no administration in time range)    Or  acetaminophen (TYLENOL) suppository 650 mg (has no administration in time range)  insulin aspart (novoLOG) injection 0-15 Units (has no administration in time range)  insulin aspart (novoLOG) injection 0-5 Units (has no administration in time range)    Mobility walks Low fall risk   Focused Assessments    R Recommendations: See Admitting Provider Note  Report given to:   Additional Notes: none

## 2018-09-16 NOTE — ED Notes (Addendum)
Attempted to call report x1 to [facility] RN

## 2018-09-16 NOTE — ED Notes (Addendum)
IV infiltration with blood transfusion, this RN and [place] RN stopped the infusion.

## 2018-09-16 NOTE — ED Triage Notes (Signed)
Pt states that "the nurse at the kidney center told me to go to the hospital because my blood count was low. The doctor said I need a transfusion."  Pt usually has peritoneal dialysis, but reports having a blood clot in her small intestine nad needing a procedure to remove it.  Pt A&Ox4

## 2018-09-16 NOTE — ED Provider Notes (Addendum)
[place] EMERGENCY DEPARTMENT Provider Note   CSN: [id number] Arrival date & time: 09/16/18  1556     History   Chief Complaint Chief Complaint  Patient presents with  . Abnormal Lab    HPI Susan Horton is a 48 y.o. female.     HPI   48 year old female with a history of ESRD, on peritoneal dialysis however with left arm AVF for HD,  coronary artery disease, asthma, anemia secondary to CKD, DVT, hypertension, uncontrolled diabetes, diabetic gastroparesis who was recently admitted to [facility] due to malfunctioning peritoneal dialysis catheter with peritoneal dialysis revision who presents with generalized weakness.  Began about 2 days ago, feeling generally weak, very fatigued with walking. Can't walk as much, just staying in the bed. Vision seems "brighter" more blurry both sides.  Shortness of breath like feeling tired.  Left hospital 9/2.  Kidney doctor told to come.  Last had HD 9/2.  Not doing any peritoneal now.  No chest pain.  No fevers.  No change in gastroparesis n/v.  No diarrhea, no black or bloody stools.  Have not had BM today, has had since leaving hospital. No vaginal bleeding or blood in urine.  Not on any blood thinners other than plavix.    Sugars have been ok. Abd pain since surgery has been mild and well controlled with medications. NV is at baseline with hx of gastroparesis. Has been tolerating po today.   Past Medical History:  Diagnosis Date  . Anemia   . Angio-edema   . Asthma   . CAD (coronary artery disease)    DES to mid LAD July 2018, residual moderate RCA disease  . Cataract   . CKD (chronic kidney disease) stage 4, GFR 15-29 ml/min (HCC)    Dialysis T/Th/Sa  . DVT (deep venous thrombosis) ([place])    1996 during pregnancy, 2015 left leg  . Eczema   . Essential hypertension 12/11/2015  . Gastroparesis   . GERD (gastroesophageal reflux disease)   . Hidradenitis   . Migraine   . Neuropathy   . Peripheral vascular  disease ([place])    blood clot in leg  . Type 2 diabetes mellitus ([place])   . Type II diabetes mellitus ([place])   . Urticaria     Patient Active Problem List   Diagnosis Date Noted  . Symptomatic anemia 09/16/2018  . Peripheral vascular disease, unspecified ([place]) 09/04/2018  . ESRD on dialysis ([place]) 10/11/2017  . Chronic diastolic CHF (congestive heart failure) ([place]) 06/18/2017  . Elevated troponin 06/18/2017  . Moderate persistent asthma 06/10/2017  . Adjustment disorder with depressed mood 09/08/2016  . Chest pain 08/29/2016  . CKD (chronic kidney disease) stage 4, GFR 15-29 ml/min (HCC)   . CAD S/P mLAD PCI with DES 08/06/2016  . Presence of drug coated stent in LAD coronary artery 08/06/2016  . Abnormal stress test   . Carotid bruit present 06/15/2016  . Dyslipidemia 06/15/2016  . Smoker 06/15/2016  . Neuropathy 03/10/2016  . Essential hypertension, benign 12/11/2015  . Gastroparesis   . Progressive angina ([place]) 06/05/2014  . Uncontrolled type 2 diabetes mellitus with peripheral neuropathy ([place]) 06/05/2014    Past Surgical History:  Procedure Laterality Date  . A/V FISTULAGRAM N/A 06/22/2017   Procedure: A/V FISTULAGRAM - left arm;  Surgeon: [name], MD;  Location: [facility];  Service: Cardiovascular;  Laterality: N/A;  . ABDOMINOPLASTY    . ADENOIDECTOMY    . APPENDECTOMY  1995  .  AV FISTULA PLACEMENT Left 02/01/2017   Procedure: ARTERIOVENOUS BRACHIOCEPHALIC (AV) FISTULA CREATION;  Surgeon: [name], MD;  Location: [facility];  Service: Vascular;  Laterality: Left;  . CHOLECYSTECTOMY  1993  . CORONARY ANGIOPLASTY WITH STENT PLACEMENT    . CORONARY STENT INTERVENTION N/A 08/05/2016   Procedure: Coronary Stent Intervention;  Surgeon: [name], MD;  Location: [facility];  Service: Cardiovascular;  Laterality: N/A;  . EXPLORATORY LAPAROTOMY  08/14/2005   lysis of adhesions, drainage of tubo-ovarian abscess  . FISTULA  SUPERFICIALIZATION Left 09/14/2017   Procedure: FISTULA SUPERFICIALIZATION ARTERIOVENOUS FISTULA LEFT ARM;  Surgeon: [name], MD;  Location: [facility];  Service: Vascular;  Laterality: Left;  . HYDRADENITIS EXCISION  02/08/2011   Procedure: EXCISION HYDRADENITIS GROIN;  Surgeon: [name], MD;  Location: [facility];  Service: General;  Laterality: N/A;  Excisioin of Hidradenitis Left groin  . INGUINAL HIDRADENITIS EXCISION  07/06/2010   bilateral  . INSERTION OF DIALYSIS CATHETER N/A 09/14/2017   Procedure: INSERTION OF TUNNELED DIALYSIS CATHETER Right Internal Jugular;  Surgeon: [name], MD;  Location: [facility];  Service: Vascular;  Laterality: N/A;  . LEFT HEART CATH AND CORONARY ANGIOGRAPHY N/A 08/05/2016   Procedure: Left Heart Cath and Coronary Angiography;  Surgeon: [name], MD;  Location: [facility];  Service: Cardiovascular;  Laterality: N/A;  . REDUCTION MAMMAPLASTY  2002  . TONSILLECTOMY    . TUBAL LIGATION  1996  . VAGINAL HYSTERECTOMY  08/04/2005   and cysto     OB History   No obstetric history on file.      Home Medications    Prior to Admission medications   Medication Sig Start Date End Date Taking? Authorizing Provider  albuterol (PROVENTIL HFA;VENTOLIN HFA) 108 (90 BASE) MCG/ACT inhaler Inhale 1-2 puffs into the lungs every 6 (six) hours as needed for wheezing or shortness of breath.     [provider]  aspirin 81 MG chewable tablet Chew 1 tablet (81 mg total) by mouth daily. 08/07/16   Cheryln Manly, NP  calcium acetate (PHOSLO) 667 MG capsule TAKE 1 CAPSULE BY MOUTH THREE TIMES DAILY WITH MEALS AND 1 CAPSULE TWO TIMES DAILY WITH SNACKS 09/23/17   [provider]  capsicum (ZOSTRIX) 0.075 % topical cream Apply 1 application topically 2 (two) times daily. 09/07/17   Jamse Arn, MD  clopidogrel (PLAVIX) 75 MG tablet Take 1 tablet (75 mg total) by mouth daily with breakfast. 10/19/17    Kilroy, Doreene Burke, PA-C  Continuous Blood Gluc Sensor (FREESTYLE LIBRE 14 DAY SENSOR) MISC 1 each by Does not apply route every 14 (fourteen) days. Change every 2 weeks 05/22/18   Philemon Kingdom, MD  Ferrous Sulfate (IRON) 325 (65 Fe) MG TABS Take 325 mg by mouth daily. 11/25/15   Brayton Caves, PA-C  Fluticasone-Salmeterol (ADVAIR) 250-50 MCG/DOSE AEPB Inhale 1 puff into the lungs 2 (two) times daily. Patient taking differently: Inhale 1 puff into the lungs daily as needed (shortness of breath).  06/10/17   Charlott Rakes, MD  Insulin Glargine, 1 Unit Dial, (TOUJEO SOLOSTAR) 300 UNIT/ML SOPN Inject 25 Units into the skin 2 (two) times a day. 05/22/18   Philemon Kingdom, MD  insulin regular (HUMULIN R) 100 units/mL injection Inject 0.2-0.25 mLs (20-25 Units total) into the skin See admin instructions. 09/22/17   Philemon Kingdom, MD  isosorbide mononitrate (IMDUR) 30 MG 24 hr tablet Take 0.5 tablets (15 mg total)  by mouth daily. 10/19/17 09/04/18  Erlene Quan, PA-C  lidocaine (XYLOCAINE) 5 % ointment Apply 1 application topically as needed. 10/05/17   Jamse Arn, MD  lidocaine-prilocaine (EMLA) cream APPLY SMALL AMOUNT TO ACCESS SITE (AVF) 30 MINUTES BEFORE DIALYSIS. COVER WITH OCCLUSIVE DRESSING (SARAN WRAP) THREE DAYS A WEEK (3 TIMES A 09/15/17   [provider]  lovastatin (MEVACOR) 20 MG tablet Take 20 mg by mouth at bedtime.     [provider]  methocarbamol (ROBAXIN) 500 MG tablet Take 1 tablet (500 mg total) by mouth 2 (two) times daily as needed for muscle spasms. 09/07/17   Jamse Arn, MD  metoCLOPramide (REGLAN) 5 MG tablet TAKE 1 TABLET BY MOUTH 4 TIMES DAILY BEFORE MEALS AND AT BEDTIME. Patient taking differently: Take 5 mg by mouth 4 (four) times daily -  before meals and at bedtime.  03/18/17   [name], MD  metoprolol succinate (TOPROL-XL) 50 MG 24 hr tablet Take 1 tablet (50 mg total) by mouth daily. 03/14/18   [name], MD  montelukast  (SINGULAIR) 10 MG tablet Take 10 mg by mouth daily as needed (allergies).     [provider]  montelukast (SINGULAIR) 10 MG tablet Take 1 tablet (10 mg total) by mouth at bedtime. 04/12/18   Kennith Gain, MD  multivitamin (RENA-VIT) TABS tablet Take 1 tablet by mouth at bedtime. 06/23/17   Mikhail, Velta Addison, DO  nitroGLYCERIN (NITROSTAT) 0.4 MG SL tablet Place 1 tablet (0.4 mg total) under the tongue every 5 (five) minutes as needed for chest pain. 10/19/17   Erlene Quan, PA-C  pantoprazole (PROTONIX) 40 MG tablet TAKE 1 TABLET BY MOUTH DAILY. 02/14/18   [name], MD  senna-docusate (SENOKOT-S) 8.6-50 MG tablet Take 1 tablet by mouth 2 (two) times daily. Patient taking differently: Take 1 tablet by mouth daily as needed for moderate constipation.  08/30/16   Geradine Girt, DO  trimethoprim-polymyxin b (POLYTRIM) ophthalmic solution INSTILL ONE DROP INTO BOTH EYES 4 TIMES A DAY FOR 2 DAYS AFTER EACH MONTHLY EYE INJECTION 06/16/17   [provider]    Family History Family History  Problem Relation Age of Onset  . Allergic rhinitis Mother   . Hypertension Father   . Allergic rhinitis Father   . Hypertension Sister   . Allergic rhinitis Sister   . Hypertension Brother   . Allergic rhinitis Brother   . Breast cancer Cousin        x2    Social History Social History   Tobacco Use  . Smoking status: Former Smoker    Years: 20.00    Types: Cigarettes    Quit date: 11/2016    Years since quitting: 1.8  . Smokeless tobacco: Never Used  Substance Use Topics  . Alcohol use: No    Alcohol/week: 0.0 standard drinks  . Drug use: No     Allergies   Penicillins, Repatha [evolocumab], and Lisinopril   Review of Systems Review of Systems  Constitutional: Positive for fatigue. Negative for fever.  HENT: Negative for sore throat.   Eyes: Negative for visual disturbance.  Respiratory: Positive for shortness of breath. Negative for cough.    Cardiovascular: Negative for chest pain and leg swelling.  Gastrointestinal: Positive for constipation, nausea and vomiting. Negative for abdominal pain (mild since surgery) and diarrhea.  Genitourinary: Negative for difficulty urinating.  Musculoskeletal: Negative for back pain and neck pain.  Skin: Negative for rash.  Neurological: Negative  for syncope and headaches.     Physical Exam Updated Vital Signs BP (!) 163/66 (BP Location: Right Arm)   Pulse (!) 106   Temp 99 F (37.2 C) (Oral)   Resp 15   Ht 5\' 5"  (1.651 m)   Wt 81.2 kg   SpO2 100%   BMI 29.79 kg/m   Physical Exam Vitals signs and nursing note reviewed.  Constitutional:      General: She is not in acute distress.    Appearance: She is well-developed. She is not diaphoretic.  HENT:     Head: Normocephalic and atraumatic.  Eyes:     Conjunctiva/sclera: Conjunctivae normal.  Neck:     Musculoskeletal: Normal range of motion.  Cardiovascular:     Rate and Rhythm: Normal rate and regular rhythm.     Heart sounds: Normal heart sounds. No murmur. No friction rub. No gallop.   Pulmonary:     Effort: Pulmonary effort is normal. No respiratory distress.     Breath sounds: Normal breath sounds. No wheezing or rales.  Abdominal:     General: There is no distension.     Palpations: Abdomen is soft.     Tenderness: There is no abdominal tenderness. There is no guarding.     Comments: Mild tenderness diffusely to abdomen No erythema Mild distention   Musculoskeletal:        General: No tenderness.  Skin:    General: Skin is warm and dry.     Findings: No erythema or rash.  Neurological:     Mental Status: She is alert and oriented to person, place, and time.      ED Treatments / Results  Labs (all labs ordered are listed, but only abnormal results are displayed) Labs Reviewed  CBC WITH DIFFERENTIAL/PLATELET - Abnormal; Notable for the following components:      Result Value   WBC 12.4 (*)    RBC 2.64 (*)     Hemoglobin 6.8 (*)    HCT 23.4 (*)    MCH 25.8 (*)    MCHC 29.1 (*)    Platelets 415 (*)    Neutro Abs 8.3 (*)    Eosinophils Absolute 0.9 (*)    All other components within normal limits  COMPREHENSIVE METABOLIC PANEL - Abnormal; Notable for the following components:   Glucose, Bld 166 (*)    BUN 49 (*)    Creatinine, Ser 8.55 (*)    Calcium 8.2 (*)    Albumin 2.7 (*)    AST 11 (*)    GFR calc non Af Amer 5 (*)    GFR calc Af Amer 6 (*)    All other components within normal limits  SARS CORONAVIRUS 2 (HOSPITAL ORDER, [facility])  TYPE AND SCREEN  PREPARE RBC (CROSSMATCH)  ABO/RH    EKG EKG Interpretation  Date/Time:  Saturday September 16 2018 16:07:11 EDT Ventricular Rate:  105 PR Interval:    QRS Duration: 88 QT Interval:  345 QTC Calculation: 456 R Axis:   41 Text Interpretation:  Sinus tachycardia Ventricular premature complex Abnormal T, consider ischemia, lateral leads Since prior ECG, new TW changes lateral leads Confirmed by Gareth Morgan [phone]) on 09/16/2018 4:52:27 PM   Radiology Dg Chest Portable 1 View  Result Date: 09/16/2018 CLINICAL DATA:  Anemia, end-stage renal disease on dialysis, coronary artery disease, diabetes mellitus, former smoker EXAM: PORTABLE CHEST 1 VIEW COMPARISON:  Portable exam 1648 hours compared to 09/14/2017 FINDINGS: Borderline enlargement of  cardiac silhouette. Mediastinal contours and pulmonary vascularity normal. Lungs clear. No pleural effusion or pneumothorax. Osseous structures unremarkable. IMPRESSION: No acute abnormalities. Electronically Signed   By: Lavonia Dana M.D.   On: 09/16/2018 16:58    Procedures .Critical Care Performed by: Gareth Morgan, MD Authorized by: Gareth Morgan, MD   Critical care provider statement:    Critical care time (minutes):  30   Critical care was time spent personally by me on the following activities:  Discussions with consultants, evaluation of patient's  response to treatment, examination of patient, ordering and performing treatments and interventions, ordering and review of laboratory studies, ordering and review of radiographic studies, pulse oximetry, re-evaluation of patient's condition, obtaining history from patient or surrogate and review of old charts Angiocath insertion  Date/Time: 09/16/2018 5:55 PM Performed by: Gareth Morgan, MD Authorized by: Gareth Morgan, MD  Consent: Verbal consent obtained. Risks and benefits: risks, benefits and alternatives were discussed Consent given by: patient Imaging studies: imaging studies available Required items: required blood products, implants, devices, and special equipment available Patient identity confirmed: verbally with patient Time out: Immediately prior to procedure a "time out" was called to verify the correct patient, procedure, equipment, support staff and site/side marked as required. Preparation: Patient was prepped and draped in the usual sterile fashion. Local anesthesia used: no  Anesthesia: Local anesthesia used: no  Sedation: Patient sedated: no  Patient tolerance: patient tolerated the procedure well with no immediate complications    (including critical care time)  Medications Ordered in ED Medications  0.9 %  sodium chloride infusion (Manually program via Guardrails IV Fluids) (has no administration in time range)     Initial Impression / Assessment and Plan / ED Course  I have reviewed the triage vital signs and the nursing notes.  Pertinent labs & imaging results that were available during my care of the patient were reviewed by me and considered in my medical decision making (see chart for details).        48 year old female with a history of ESRD, on peritoneal dialysis however with left arm AVF for HD,  coronary artery disease, asthma, anemia secondary to CKD, DVT, hypertension, uncontrolled diabetes, diabetic gastroparesis who was recently  admitted to [facility] regional due to malfunctioning peritoneal dialysis catheter with peritoneal dialysis revision who presents with generalized weakness, with anemia found on outpatient labs.  Currently getting HD through left AVF off of [place] while PD catheter site healing.  DDx for dyspnea and fatigue includes anemia, PE, pneumonia, volume overload.  EKG with sinus rhythm.  No chest pain to suggest ACS.   Labs repeated here show hgb 6.8. Was 7.1 at time of discharge from [facility] 9/2.  Called [facility] but no surgery physician on call--given similar values today to values at discharge from [facility], I do not feel transfer is indicated as suspect this is continuing anemia from known post-operative anemia from surgery.  Given anemia, feel this is most likely etiology of patient's dyspnea on exertion and fatigue.  Chest x-ray shows no acute abnormalities.  Have low suspicion at this time for pneumonia, PE, or pulmonary edema.  Her potassium is normal and there are no indications for emergent hemodialysis.  Discussed risks and benefits of blood transfusion, and ordered 1 unit of PRBCs.  Will admit to the hospitalist service for further care.    Final Clinical Impressions(s) / ED Diagnoses   Final diagnoses:  Symptomatic anemia    ED Discharge Orders    None  Gareth Morgan, MD 09/16/18 [phone]

## 2018-09-16 NOTE — ED Notes (Signed)
ED TO INPATIENT HANDOFF REPORT  ED Nurse Name and Phone #: Lovell Sheehan [phone]  S Name/Age/Gender Susan Horton 48 y.o. female Room/Bed: 036C/036C  Code Status   Code Status: Full Code  Home/SNF/Other Home Patient oriented to: self, place, time and situation Is this baseline? Yes   Triage Complete: Triage complete  Chief Complaint post op surgery sob weakness blood count low  Triage Note Pt states that "the nurse at the kidney center told me to go to the hospital because my blood count was low. The doctor said I need a transfusion."  Pt usually has peritoneal dialysis, but reports having a blood clot in her small intestine nad needing a procedure to remove it.  Pt A&Ox4    Allergies Allergies  Allergen Reactions  . Penicillins Anaphylaxis, Itching, Swelling, Rash and Other (See Comments)    Swelling of throat & whole mouth  Has patient had a PCN reaction causing immediate rash, facial/tongue/throat swelling, SOB or lightheadedness with hypotension: Yes Has patient had a PCN reaction causing severe rash involving mucus membranes or skin necrosis: No Has patient had a PCN reaction that required hospitalization: No Has patient had a PCN reaction occurring within the last 10 years: No If all of the above answers are "NO", then may proceed with Cephalosporin use.  . Repatha [Evolocumab] Itching  . Lisinopril Cough    Level of Care/Admitting Diagnosis ED Disposition    ED Disposition Condition [facility] [100100]  Level of Care: Med-Surg [16]  I expect the patient will be discharged within 24 hours: Yes  LOW acuity---Tx typically complete <24 hrs---ACUTE conditions typically can be evaluated <24 hours---LABS likely to return to acceptable levels <24 hours---IS near functional baseline---EXPECTED to return to current living arrangement---NOT newly hypoxic: Meets criteria for 5C-Observation unit  Covid Evaluation: Asymptomatic  Screening Protocol (No Symptoms)  Diagnosis: Symptomatic anemia [7824235]  Admitting Physician: Darliss Cheney [[id number]]  Attending Physician: Darliss Cheney [[id number]]  PT Class (Do Not Modify): Observation [104]  PT Acc Code (Do Not Modify): Observation [10022]       B Medical/Surgery History Past Medical History:  Diagnosis Date  . Anemia   . Angio-edema   . Asthma   . CAD (coronary artery disease)    DES to mid LAD July 2018, residual moderate RCA disease  . Cataract   . CKD (chronic kidney disease) stage 4, GFR 15-29 ml/min (HCC)    Dialysis T/Th/Sa  . DVT (deep venous thrombosis) ([place])    1996 during pregnancy, 2015 left leg  . Eczema   . Essential hypertension 12/11/2015  . Gastroparesis   . GERD (gastroesophageal reflux disease)   . Hidradenitis   . Migraine   . Neuropathy   . Peripheral vascular disease ([place])    blood clot in leg  . Type 2 diabetes mellitus ([place])   . Type II diabetes mellitus ([place])   . Urticaria    Past Surgical History:  Procedure Laterality Date  . A/V FISTULAGRAM N/A 06/22/2017   Procedure: A/V FISTULAGRAM - left arm;  Surgeon: [name], MD;  Location: [facility];  Service: Cardiovascular;  Laterality: N/A;  . ABDOMINOPLASTY    . ADENOIDECTOMY    . APPENDECTOMY  1995  . AV FISTULA PLACEMENT Left 02/01/2017   Procedure: ARTERIOVENOUS BRACHIOCEPHALIC (AV) FISTULA CREATION;  Surgeon: [name], MD;  Location: [facility];  Service: Vascular;  Laterality: Left;  . CHOLECYSTECTOMY  1993  .  CORONARY ANGIOPLASTY WITH STENT PLACEMENT    . CORONARY STENT INTERVENTION N/A 08/05/2016   Procedure: Coronary Stent Intervention;  Surgeon: [name], MD;  Location: [facility];  Service: Cardiovascular;  Laterality: N/A;  . EXPLORATORY LAPAROTOMY  08/14/2005   lysis of adhesions, drainage of tubo-ovarian abscess  . FISTULA SUPERFICIALIZATION Left 09/14/2017   Procedure: FISTULA SUPERFICIALIZATION ARTERIOVENOUS FISTULA  LEFT ARM;  Surgeon: [name], MD;  Location: [facility];  Service: Vascular;  Laterality: Left;  . HYDRADENITIS EXCISION  02/08/2011   Procedure: EXCISION HYDRADENITIS GROIN;  Surgeon: [name], MD;  Location: [facility];  Service: General;  Laterality: N/A;  Excisioin of Hidradenitis Left groin  . INGUINAL HIDRADENITIS EXCISION  07/06/2010   bilateral  . INSERTION OF DIALYSIS CATHETER N/A 09/14/2017   Procedure: INSERTION OF TUNNELED DIALYSIS CATHETER Right Internal Jugular;  Surgeon: [name], MD;  Location: [facility];  Service: Vascular;  Laterality: N/A;  . LEFT HEART CATH AND CORONARY ANGIOGRAPHY N/A 08/05/2016   Procedure: Left Heart Cath and Coronary Angiography;  Surgeon: [name], MD;  Location: [facility];  Service: Cardiovascular;  Laterality: N/A;  . REDUCTION MAMMAPLASTY  2002  . TONSILLECTOMY    . TUBAL LIGATION  1996  . VAGINAL HYSTERECTOMY  08/04/2005   and cysto     A IV Location/Drains/Wounds Patient Lines/Drains/Airways Status   Active Line/Drains/Airways    Name:   Placement date:   Placement time:   Site:   Days:   Peripheral IV 09/16/18 Right Antecubital   09/16/18    1952    Antecubital   less than 1   Fistula / Graft Left Upper arm Arteriovenous fistula   02/01/17    2342    Upper arm   592   Hemodialysis Catheter Right Internal jugular Permanent   09/14/17    1326    Internal jugular   367   Incision (Closed) 02/01/17 Arm Left   02/01/17    0840     592   Incision (Closed) 09/14/17 Neck Right   09/14/17    1328     367   Incision (Closed) 09/14/17 Arm Left   09/14/17    1405     367          Intake/Output Last 24 hours No intake or output data in the 24 hours ending 09/16/18 2044  Labs/Imaging Results for orders placed or performed during the hospital encounter of 09/16/18 (from the past 48 hour(s))  SARS Coronavirus 2 [facility] order, Performed in [facility] hospital lab) Nasopharyngeal Nasopharyngeal  Swab     Status: None   Collection Time: 09/16/18  4:22 PM   Specimen: Nasopharyngeal Swab  Result Value Ref Range   SARS Coronavirus 2 NEGATIVE NEGATIVE    Comment: (NOTE) If result is NEGATIVE SARS-CoV-2 target nucleic acids are NOT DETECTED. The SARS-CoV-2 RNA is generally detectable in upper and lower  respiratory specimens during the acute phase of infection. The lowest  concentration of SARS-CoV-2 viral copies this assay can detect is 250  copies / mL. A negative result does not preclude SARS-CoV-2 infection  and should not be used as the sole basis for treatment or other  patient management decisions.  A negative result may occur with  improper specimen collection / handling, submission of specimen other  than nasopharyngeal swab, presence of viral mutation(s) within the  areas targeted by this assay, and inadequate number of viral copies  (<  250 copies / mL). A negative result must be combined with clinical  observations, patient history, and epidemiological information. If result is POSITIVE SARS-CoV-2 target nucleic acids are DETECTED. The SARS-CoV-2 RNA is generally detectable in upper and lower  respiratory specimens dur ing the acute phase of infection.  Positive  results are indicative of active infection with SARS-CoV-2.  Clinical  correlation with patient history and other diagnostic information is  necessary to determine patient infection status.  Positive results do  not rule out bacterial infection or co-infection with other viruses. If result is PRESUMPTIVE POSTIVE SARS-CoV-2 nucleic acids MAY BE PRESENT.   A presumptive positive result was obtained on the submitted specimen  and confirmed on repeat testing.  While 2019 novel coronavirus  (SARS-CoV-2) nucleic acids may be present in the submitted sample  additional confirmatory testing may be necessary for epidemiological  and / or clinical management purposes  to differentiate between  SARS-CoV-2 and other  Sarbecovirus currently known to infect humans.  If clinically indicated additional testing with an alternate test  methodology [phone]) is advised. The SARS-CoV-2 RNA is generally  detectable in upper and lower respiratory sp ecimens during the acute  phase of infection. The expected result is Negative. Fact Sheet for Patients:  StrictlyIdeas.no Fact Sheet for Healthcare Providers: BankingDealers.co.za This test is not yet approved or cleared by the [place] FDA and has been authorized for detection and/or diagnosis of SARS-CoV-2 by FDA under an Emergency Use Authorization (EUA).  This EUA will remain in effect (meaning this test can be used) for the duration of the COVID-19 declaration under Section 564(b)(1) of the Act, 21 U.S.C. section 360bbb-3(b)(1), unless the authorization is terminated or revoked sooner. Performed at [facility], [place] [address], [place], [place] [address]   CBC with Differential     Status: Abnormal   Collection Time: 09/16/18  4:50 PM  Result Value Ref Range   WBC 12.4 (H) 4.0 - 10.5 K/uL   RBC 2.64 (L) 3.87 - 5.11 MIL/uL   Hemoglobin 6.8 (LL) 12.0 - 15.0 g/dL    Comment: REPEATED TO VERIFY THIS CRITICAL RESULT HAS VERIFIED AND BEEN CALLED TO RN E Winna Golla BY LESLIE BENFIELD ON 09 05 2020 AT 1726, AND HAS BEEN READ BACK.     HCT 23.4 (L) 36.0 - 46.0 %   MCV 88.6 80.0 - 100.0 fL   MCH 25.8 (L) 26.0 - 34.0 pg   MCHC 29.1 (L) 30.0 - 36.0 g/dL   RDW 14.1 11.5 - 15.5 %   Platelets 415 (H) 150 - 400 K/uL   nRBC 0.0 0.0 - 0.2 %   Neutrophils Relative % 66 %   Neutro Abs 8.3 (H) 1.7 - 7.7 K/uL   Lymphocytes Relative 20 %   Lymphs Abs 2.5 0.7 - 4.0 K/uL   Monocytes Relative 6 %   Monocytes Absolute 0.7 0.1 - 1.0 K/uL   Eosinophils Relative 7 %   Eosinophils Absolute 0.9 (H) 0.0 - 0.5 K/uL   Basophils Relative 0 %   Basophils Absolute 0.0 0.0 - 0.1 K/uL   Immature Granulocytes 1 %   Abs Immature  Granulocytes 0.07 0.00 - 0.07 K/uL    Comment: Performed at [facility] [address] [place], [place] [address]  Comprehensive metabolic panel     Status: Abnormal   Collection Time: 09/16/18  4:50 PM  Result Value Ref Range   Sodium 136 135 - 145 mmol/L   Potassium 4.4 3.5 -  5.1 mmol/L   Chloride 102 98 - 111 mmol/L   CO2 23 22 - 32 mmol/L   Glucose, Bld 166 (H) 70 - 99 mg/dL   BUN 49 (H) 6 - 20 mg/dL   Creatinine, Ser 8.55 (H) 0.44 - 1.00 mg/dL   Calcium 8.2 (L) 8.9 - 10.3 mg/dL   Total Protein 6.5 6.5 - 8.1 g/dL   Albumin 2.7 (L) 3.5 - 5.0 g/dL   AST 11 (L) 15 - 41 U/L   ALT 11 0 - 44 U/L   Alkaline Phosphatase 83 38 - 126 U/L   Total Bilirubin 0.4 0.3 - 1.2 mg/dL   GFR calc non Af Amer 5 (L) >60 mL/min   GFR calc Af Amer 6 (L) >60 mL/min   Anion gap 11 5 - 15    Comment: Performed at [facility], [address], [place], [place] [address]  ABO/Rh     Status: None   Collection Time: 09/16/18  4:54 PM  Result Value Ref Range   ABO/RH(D)      O POS Performed at [facility] [address] [place], [place] [address]   Type and screen [place]     Status: None (Preliminary result)   Collection Time: 09/16/18  5:03 PM  Result Value Ref Range   ABO/RH(D) O POS    Antibody Screen NEG    Sample Expiration 09/19/2018,2359    Unit Number [id number]    Blood Component Type RED CELLS,LR    Unit division 00    Status of Unit ISSUED    Transfusion Status OK TO TRANSFUSE    Crossmatch Result      Compatible Performed at [facility], [place] [address], [place], [place] [address]   Prepare RBC     Status: None   Collection Time: 09/16/18  5:34 PM  Result Value Ref Range   Order Confirmation      ORDER PROCESSED BY BLOOD BANK Performed at [facility], [place] [address] [place], [place] [address]    Dg Chest Portable 1 View  Result Date: 09/16/2018 CLINICAL DATA:  Anemia, end-stage renal disease on dialysis, coronary  artery disease, diabetes mellitus, former smoker EXAM: PORTABLE CHEST 1 VIEW COMPARISON:  Portable exam 1648 hours compared to 09/14/2017 FINDINGS: Borderline enlargement of cardiac silhouette. Mediastinal contours and pulmonary vascularity normal. Lungs clear. No pleural effusion or pneumothorax. Osseous structures unremarkable. IMPRESSION: No acute abnormalities. Electronically Signed   By: Lavonia Dana M.D.   On: 09/16/2018 16:58    Pending Labs Unresulted Labs (From admission, onward)    Start     Ordered   09/17/18 5681  Basic metabolic panel  Tomorrow morning,   R     09/16/18 1816   09/17/18 0500  CBC  Tomorrow morning,   R     09/16/18 1816   09/16/18 1817  HIV antibody (Routine Testing)  Once,   STAT     09/16/18 1816          Vitals/Pain Today's Vitals   09/16/18 1630 09/16/18 1645 09/16/18 1921 09/16/18 1942  BP: (!) 136/59  (!) 162/78 (!) 162/78  Pulse:    (!) 109  Resp: 15 16 (!) 22 16  Temp:   98.8 F (37.1 C) 98.8 F (37.1 C)  TempSrc:   Oral   SpO2:    100%  Weight:      Height:      PainSc:  4     Isolation Precautions No active isolations  Medications Medications  0.9 %  sodium chloride infusion (Manually program via Guardrails IV Fluids) (has no administration in time range)  aspirin chewable tablet 81 mg (has no administration in time range)  isosorbide mononitrate (IMDUR) 24 hr tablet 15 mg (has no administration in time range)  pravastatin (PRAVACHOL) tablet 20 mg (has no administration in time range)  metoprolol succinate (TOPROL-XL) 24 hr tablet 50 mg (has no administration in time range)  nitroGLYCERIN (NITROSTAT) SL tablet 0.4 mg (has no administration in time range)  Insulin Glargine (1 Unit Dial) SOPN 25 Units (has no administration in time range)  metoCLOPramide (REGLAN) tablet 5 mg (has no administration in time range)  pantoprazole (PROTONIX) EC tablet 40 mg (has no administration in time range)  senna-docusate (Senokot-S) tablet 1 tablet  (has no administration in time range)  clopidogrel (PLAVIX) tablet 75 mg (has no administration in time range)  Iron TABS 325 mg (has no administration in time range)  methocarbamol (ROBAXIN) tablet 500 mg (has no administration in time range)  albuterol (VENTOLIN HFA) 108 (90 Base) MCG/ACT inhaler 1-2 puff (has no administration in time range)  mometasone-formoterol (DULERA) 200-5 MCG/ACT inhaler 2 puff (has no administration in time range)  montelukast (SINGULAIR) tablet 10 mg (has no administration in time range)  montelukast (SINGULAIR) tablet 10 mg (has no administration in time range)  heparin injection 5,000 Units (has no administration in time range)  acetaminophen (TYLENOL) tablet 650 mg (has no administration in time range)    Or  acetaminophen (TYLENOL) suppository 650 mg (has no administration in time range)  insulin aspart (novoLOG) injection 0-15 Units (has no administration in time range)  insulin aspart (novoLOG) injection 0-5 Units (has no administration in time range)    Mobility walks Low fall risk   Focused Assessments    R Recommendations: See Admitting Provider Note  Report given to:   Additional Notes:

## 2018-09-16 NOTE — H&P (Signed)
History and Physical    Susan Horton CHE:[id number] DOB: 10-Jul-1970 DOA: 09/16/2018  PCP: Charlott Rakes, MD  Patient coming from: Home  I have personally briefly reviewed patient's old medical records in [place]  Chief Complaint: Generalized weakness  HPI: Susan Horton is a 48 y.o. female with medical history significant of end-stage renal disease on dialysis, type 2 diabetes mellitus, hypertension and several others as mentioned below presented to ED with a complaint of generalized weakness.  According to patient, she used to do daily peritoneal dialysis at home however her peritoneal dialysis catheter got clogged on 09/12/2018 for which she went to [facility] and was admitted overnight.  She received hemodialysis on 09/12/2020 over existing AV fistula in the left upper arm and was discharged home with the plan to have hemodialysis for at least 2 weeks with the next being scheduled on 09/18/2018 however today, patient started feeling generalized weakness.  No other complaint.  Denies any chest pain, shortness of breath, nausea, vomiting, fever, chills or anything else.  ED Course: Upon arrival to the ED, she was hemodynamically stable however labs showed her hemoglobin dropped to 6.8.  Reportedly her hemoglobin was 7.1 on discharge from [facility] just 3 days ago.  Creatinine was 8.55, very close to her baseline.  Electrolytes are stable.  Hospital service was consulted to admit the patient for symptomatic anemia.  1 unit of PRBC ordered by ER physician.  Review of Systems: As per HPI otherwise negative.    Past Medical History:  Diagnosis Date  . Anemia   . Angio-edema   . Asthma   . CAD (coronary artery disease)    DES to mid LAD July 2018, residual moderate RCA disease  . Cataract   . CKD (chronic kidney disease) stage 4, GFR 15-29 ml/min (HCC)    Dialysis T/Th/Sa  . DVT (deep venous thrombosis) ([place])    1996 during pregnancy, 2015 left leg  .  Eczema   . Essential hypertension 12/11/2015  . Gastroparesis   . GERD (gastroesophageal reflux disease)   . Hidradenitis   . Migraine   . Neuropathy   . Peripheral vascular disease ([place])    blood clot in leg  . Type 2 diabetes mellitus ([place])   . Type II diabetes mellitus ([place])   . Urticaria     Past Surgical History:  Procedure Laterality Date  . A/V FISTULAGRAM N/A 06/22/2017   Procedure: A/V FISTULAGRAM - left arm;  Surgeon: [name], MD;  Location: [place] [facility];  Service: Cardiovascular;  Laterality: N/A;  . ABDOMINOPLASTY    . ADENOIDECTOMY    . APPENDECTOMY  1995  . AV FISTULA PLACEMENT Left 02/01/2017   Procedure: ARTERIOVENOUS BRACHIOCEPHALIC (AV) FISTULA CREATION;  Surgeon: [name], MD;  Location: [place];  Service: Vascular;  Laterality: Left;  . CHOLECYSTECTOMY  1993  . CORONARY ANGIOPLASTY WITH STENT PLACEMENT    . CORONARY STENT INTERVENTION N/A 08/05/2016   Procedure: Coronary Stent Intervention;  Surgeon: [name], MD;  Location: [place] [facility];  Service: Cardiovascular;  Laterality: N/A;  . EXPLORATORY LAPAROTOMY  08/14/2005   lysis of adhesions, drainage of tubo-ovarian abscess  . FISTULA SUPERFICIALIZATION Left 09/14/2017   Procedure: FISTULA SUPERFICIALIZATION ARTERIOVENOUS FISTULA LEFT ARM;  Surgeon: [name], MD;  Location: [place];  Service: Vascular;  Laterality: Left;  . HYDRADENITIS EXCISION  02/08/2011   Procedure: EXCISION HYDRADENITIS GROIN;  Surgeon: [name],  MD;  Location: [place];  Service: General;  Laterality: N/A;  Excisioin of Hidradenitis Left groin  . INGUINAL HIDRADENITIS EXCISION  07/06/2010   bilateral  . INSERTION OF DIALYSIS CATHETER N/A 09/14/2017   Procedure: INSERTION OF TUNNELED DIALYSIS CATHETER Right Internal Jugular;  Surgeon: [name], MD;  Location: [place];  Service: Vascular;  Laterality: N/A;  . LEFT HEART CATH AND CORONARY ANGIOGRAPHY N/A  08/05/2016   Procedure: Left Heart Cath and Coronary Angiography;  Surgeon: [name], MD;  Location: [place] [facility];  Service: Cardiovascular;  Laterality: N/A;  . REDUCTION MAMMAPLASTY  2002  . TONSILLECTOMY    . TUBAL LIGATION  1996  . VAGINAL HYSTERECTOMY  08/04/2005   and cysto     reports that she quit smoking about 22 months ago. Her smoking use included cigarettes. She quit after 20.00 years of use. She has never used smokeless tobacco. She reports that she does not drink alcohol or use drugs.  Allergies  Allergen Reactions  . Penicillins Anaphylaxis, Itching, Swelling, Rash and Other (See Comments)    Swelling of throat & whole mouth  Has patient had a PCN reaction causing immediate rash, facial/tongue/throat swelling, SOB or lightheadedness with hypotension: Yes Has patient had a PCN reaction causing severe rash involving mucus membranes or skin necrosis: No Has patient had a PCN reaction that required hospitalization: No Has patient had a PCN reaction occurring within the last 10 years: No If all of the above answers are "NO", then may proceed with Cephalosporin use.  . Repatha [Evolocumab] Itching  . Lisinopril Cough    Family History  Problem Relation Age of Onset  . Allergic rhinitis Mother   . Hypertension Father   . Allergic rhinitis Father   . Hypertension Sister   . Allergic rhinitis Sister   . Hypertension Brother   . Allergic rhinitis Brother   . Breast cancer Cousin        x2    Prior to Admission medications   Medication Sig Start Date End Date Taking? Authorizing Provider  albuterol (PROVENTIL HFA;VENTOLIN HFA) 108 (90 BASE) MCG/ACT inhaler Inhale 1-2 puffs into the lungs every 6 (six) hours as needed for wheezing or shortness of breath.     [provider]  aspirin 81 MG chewable tablet Chew 1 tablet (81 mg total) by mouth daily. 08/07/16   Cheryln Manly, NP  calcium acetate (PHOSLO) 667 MG capsule TAKE 1 CAPSULE BY MOUTH THREE  TIMES DAILY WITH MEALS AND 1 CAPSULE TWO TIMES DAILY WITH SNACKS 09/23/17   [provider]  capsicum (ZOSTRIX) 0.075 % topical cream Apply 1 application topically 2 (two) times daily. 09/07/17   Jamse Arn, MD  clopidogrel (PLAVIX) 75 MG tablet Take 1 tablet (75 mg total) by mouth daily with breakfast. 10/19/17   Kilroy, Doreene Burke, PA-C  Continuous Blood Gluc Sensor (FREESTYLE LIBRE 14 DAY SENSOR) MISC 1 each by Does not apply route every 14 (fourteen) days. Change every 2 weeks 05/22/18   Philemon Kingdom, MD  Ferrous Sulfate (IRON) 325 (65 Fe) MG TABS Take 325 mg by mouth daily. 11/25/15   Brayton Caves, PA-C  Fluticasone-Salmeterol (ADVAIR) 250-50 MCG/DOSE AEPB Inhale 1 puff into the lungs 2 (two) times daily. Patient taking differently: Inhale 1 puff into the lungs daily as needed (shortness of breath).  06/10/17   Charlott Rakes, MD  Insulin Glargine, 1 Unit Dial, (TOUJEO SOLOSTAR) 300 UNIT/ML SOPN Inject 25 Units into the  skin 2 (two) times a day. 05/22/18   Philemon Kingdom, MD  insulin regular (HUMULIN R) 100 units/mL injection Inject 0.2-0.25 mLs (20-25 Units total) into the skin See admin instructions. 09/22/17   Philemon Kingdom, MD  isosorbide mononitrate (IMDUR) 30 MG 24 hr tablet Take 0.5 tablets (15 mg total) by mouth daily. 10/19/17 09/04/18  Erlene Quan, PA-C  lidocaine (XYLOCAINE) 5 % ointment Apply 1 application topically as needed. 10/05/17   Jamse Arn, MD  lidocaine-prilocaine (EMLA) cream APPLY SMALL AMOUNT TO ACCESS SITE (AVF) 30 MINUTES BEFORE DIALYSIS. COVER WITH OCCLUSIVE DRESSING (SARAN WRAP) THREE DAYS A WEEK (3 TIMES A 09/15/17   [provider]  lovastatin (MEVACOR) 20 MG tablet Take 20 mg by mouth at bedtime.     [provider]  methocarbamol (ROBAXIN) 500 MG tablet Take 1 tablet (500 mg total) by mouth 2 (two) times daily as needed for muscle spasms. 09/07/17   Jamse Arn, MD  metoCLOPramide (REGLAN) 5 MG tablet TAKE 1 TABLET  BY MOUTH 4 TIMES DAILY BEFORE MEALS AND AT BEDTIME. Patient taking differently: Take 5 mg by mouth 4 (four) times daily -  before meals and at bedtime.  03/18/17   [name], MD  metoprolol succinate (TOPROL-XL) 50 MG 24 hr tablet Take 1 tablet (50 mg total) by mouth daily. 03/14/18   [name], MD  montelukast (SINGULAIR) 10 MG tablet Take 10 mg by mouth daily as needed (allergies).     [provider]  montelukast (SINGULAIR) 10 MG tablet Take 1 tablet (10 mg total) by mouth at bedtime. 04/12/18   Kennith Gain, MD  multivitamin (RENA-VIT) TABS tablet Take 1 tablet by mouth at bedtime. 06/23/17   Mikhail, Velta Addison, DO  nitroGLYCERIN (NITROSTAT) 0.4 MG SL tablet Place 1 tablet (0.4 mg total) under the tongue every 5 (five) minutes as needed for chest pain. 10/19/17   Erlene Quan, PA-C  pantoprazole (PROTONIX) 40 MG tablet TAKE 1 TABLET BY MOUTH DAILY. 02/14/18   [name], MD  senna-docusate (SENOKOT-S) 8.6-50 MG tablet Take 1 tablet by mouth 2 (two) times daily. Patient taking differently: Take 1 tablet by mouth daily as needed for moderate constipation.  08/30/16   Geradine Girt, DO  trimethoprim-polymyxin b (POLYTRIM) ophthalmic solution INSTILL ONE DROP INTO BOTH EYES 4 TIMES A DAY FOR 2 DAYS AFTER EACH MONTHLY EYE INJECTION 06/16/17   [provider]    Physical Exam: Vitals:   09/16/18 1606 09/16/18 1607  BP: (!) 163/66   Pulse: (!) 106   Resp: 15   Temp: 99 F (37.2 C)   TempSrc: Oral   SpO2: 100%   Weight:  81.2 kg  Height:  5\' 5"  (1.651 m)    Constitutional: NAD, calm, comfortable Vitals:   09/16/18 1606 09/16/18 1607  BP: (!) 163/66   Pulse: (!) 106   Resp: 15   Temp: 99 F (37.2 C)   TempSrc: Oral   SpO2: 100%   Weight:  81.2 kg  Height:  5\' 5"  (1.651 m)   Eyes: PERRL, lids and conjunctivae normal ENMT: Mucous membranes are moist. Posterior pharynx clear of any exudate or lesions.Normal dentition.  Neck: normal,  supple, no masses, no thyromegaly Respiratory: clear to auscultation bilaterally, no wheezing, no crackles. Normal respiratory effort. No accessory muscle use.  Cardiovascular: Regular rate and rhythm, no murmurs / rubs / gallops. No extremity edema. 2+ pedal pulses. No carotid bruits.  Abdomen: no tenderness, no  masses palpated. No hepatosplenomegaly. Bowel sounds positive.  Musculoskeletal: no clubbing / cyanosis. No joint deformity upper and lower extremities. Good ROM, no contractures. Normal muscle tone.  Skin: no rashes, lesions, ulcers. No induration Neurologic: CN 2-12 grossly intact. Sensation intact, DTR normal. Strength 5/5 in all 4.  Psychiatric: Normal judgment and insight. Alert and oriented x 3. Normal mood.    Labs on Admission: I have personally reviewed following labs and imaging studies  CBC: Recent Labs  Lab 09/16/18 1650  WBC 12.4*  NEUTROABS 8.3*  HGB 6.8*  HCT 23.4*  MCV 88.6  PLT 785*   Basic Metabolic Panel: Recent Labs  Lab 09/16/18 1650  NA 136  K 4.4  CL 102  CO2 23  GLUCOSE 166*  BUN 49*  CREATININE 8.55*  CALCIUM 8.2*   GFR: Estimated Creatinine Clearance: 8.5 mL/min (A) (by C-G formula based on SCr of 8.55 mg/dL (H)). Liver Function Tests: Recent Labs  Lab 09/16/18 1650  AST 11*  ALT 11  ALKPHOS 83  BILITOT 0.4  PROT 6.5  ALBUMIN 2.7*   No results for input(s): LIPASE, AMYLASE in the last 168 hours. No results for input(s): AMMONIA in the last 168 hours. Coagulation Profile: No results for input(s): INR, PROTIME in the last 168 hours. Cardiac Enzymes: No results for input(s): CKTOTAL, CKMB, CKMBINDEX, TROPONINI in the last 168 hours. BNP (last 3 results) No results for input(s): PROBNP in the last 8760 hours. HbA1C: No results for input(s): HGBA1C in the last 72 hours. CBG: No results for input(s): GLUCAP in the last 168 hours. Lipid Profile: No results for input(s): CHOL, HDL, LDLCALC, TRIG, CHOLHDL, LDLDIRECT in the last  72 hours. Thyroid Function Tests: No results for input(s): TSH, T4TOTAL, FREET4, T3FREE, THYROIDAB in the last 72 hours. Anemia Panel: No results for input(s): VITAMINB12, FOLATE, FERRITIN, TIBC, IRON, RETICCTPCT in the last 72 hours. Urine analysis:    Component Value Date/Time   COLORURINE YELLOW 03/17/2017 0034   APPEARANCEUR CLEAR 03/17/2017 0034   LABSPEC 1.011 03/17/2017 0034   PHURINE 6.0 03/17/2017 0034   GLUCOSEU >=500 (A) 03/17/2017 0034   HGBUR NEGATIVE 03/17/2017 0034   BILIRUBINUR NEGATIVE 03/17/2017 0034   BILIRUBINUR negative 01/13/2016 1135   KETONESUR NEGATIVE 03/17/2017 0034   PROTEINUR >=300 (A) 03/17/2017 0034   UROBILINOGEN 0.2 01/13/2016 1135   UROBILINOGEN 0.2 06/20/2014 1120   NITRITE NEGATIVE 03/17/2017 0034   LEUKOCYTESUR NEGATIVE 03/17/2017 0034    Radiological Exams on Admission: Dg Chest Portable 1 View  Result Date: 09/16/2018 CLINICAL DATA:  Anemia, end-stage renal disease on dialysis, coronary artery disease, diabetes mellitus, former smoker EXAM: PORTABLE CHEST 1 VIEW COMPARISON:  Portable exam 1648 hours compared to 09/14/2017 FINDINGS: Borderline enlargement of cardiac silhouette. Mediastinal contours and pulmonary vascularity normal. Lungs clear. No pleural effusion or pneumothorax. Osseous structures unremarkable. IMPRESSION: No acute abnormalities. Electronically Signed   By: Lavonia Dana M.D.   On: 09/16/2018 16:58    Assessment/Plan Active Problems:   Uncontrolled type 2 diabetes mellitus with peripheral neuropathy (HCC)   Gastroparesis   Essential hypertension, benign   Chronic diastolic CHF (congestive heart failure) (HCC)   ESRD on dialysis (HCC)   Symptomatic anemia    Symptomatic anemia: Likely acute on chronic anemia due to CKD.  Will transfuse 1 unit which has been ordered by ED physician.  Repeat CBC later and tomorrow morning.  If over 7, she will be discharged home slightly.  ESRD on HD: Electrolytes stable.  Not in distress.  No need of acute/urgent hemodialysis.  If she gets discharged tomorrow then she will follow with her regular dialysis unit on 09/18/2018.  Type 2 diabetes mellitus: Uses Lantus at home which I will continue and will also place her on SSI.  Hypertension: Resume all home medications and monitor.  DVT prophylaxis: Heparin Code Status: Full code Family Communication: Plan of care discussed with patient and her sister at the bedside in length and he verbalized understanding and agreed with it. Disposition Plan: Most likely discharge home tomorrow. Consults called: None Admission status: Observation   Darliss Cheney MD [facility] Pager [phone]  If 7PM-7AM, please contact night-coverage [email or web address] Password [facility]  09/16/2018, 6:04 PM

## 2018-09-17 ENCOUNTER — Other Ambulatory Visit: Payer: Self-pay

## 2018-09-17 DIAGNOSIS — N186 End stage renal disease: Secondary | ICD-10-CM | POA: Diagnosis not present

## 2018-09-17 DIAGNOSIS — Z992 Dependence on renal dialysis: Secondary | ICD-10-CM | POA: Diagnosis not present

## 2018-09-17 DIAGNOSIS — K3184 Gastroparesis: Secondary | ICD-10-CM | POA: Diagnosis not present

## 2018-09-17 DIAGNOSIS — D631 Anemia in chronic kidney disease: Secondary | ICD-10-CM | POA: Diagnosis not present

## 2018-09-17 DIAGNOSIS — D649 Anemia, unspecified: Secondary | ICD-10-CM

## 2018-09-17 DIAGNOSIS — E1165 Type 2 diabetes mellitus with hyperglycemia: Secondary | ICD-10-CM

## 2018-09-17 DIAGNOSIS — E1142 Type 2 diabetes mellitus with diabetic polyneuropathy: Secondary | ICD-10-CM | POA: Diagnosis not present

## 2018-09-17 DIAGNOSIS — I5032 Chronic diastolic (congestive) heart failure: Secondary | ICD-10-CM | POA: Diagnosis not present

## 2018-09-17 DIAGNOSIS — I10 Essential (primary) hypertension: Secondary | ICD-10-CM | POA: Diagnosis not present

## 2018-09-17 DIAGNOSIS — Z79899 Other long term (current) drug therapy: Secondary | ICD-10-CM | POA: Diagnosis not present

## 2018-09-17 DIAGNOSIS — D509 Iron deficiency anemia, unspecified: Secondary | ICD-10-CM | POA: Diagnosis not present

## 2018-09-17 LAB — HIV ANTIBODY (ROUTINE TESTING W REFLEX): HIV Screen 4th Generation wRfx: NONREACTIVE

## 2018-09-17 LAB — BASIC METABOLIC PANEL
Anion gap: 11 (ref 5–15)
BUN: 49 mg/dL — ABNORMAL HIGH (ref 6–20)
CO2: 22 mmol/L (ref 22–32)
Calcium: 8.4 mg/dL — ABNORMAL LOW (ref 8.9–10.3)
Chloride: 106 mmol/L (ref 98–111)
Creatinine, Ser: 8.33 mg/dL — ABNORMAL HIGH (ref 0.44–1.00)
GFR calc Af Amer: 6 mL/min — ABNORMAL LOW (ref >60)
GFR calc non Af Amer: 5 mL/min — ABNORMAL LOW (ref >60)
Glucose, Bld: 115 mg/dL — ABNORMAL HIGH (ref 70–99)
Potassium: 4.2 mmol/L (ref 3.5–5.1)
Sodium: 139 mmol/L (ref 135–145)

## 2018-09-17 LAB — TYPE AND SCREEN
ABO/RH(D): O POS
Antibody Screen: NEGATIVE
Unit division: 0

## 2018-09-17 LAB — CBC
HCT: 25.7 % — ABNORMAL LOW (ref 36.0–46.0)
Hemoglobin: 7.8 g/dL — ABNORMAL LOW (ref 12.0–15.0)
MCH: 26.5 pg (ref 26.0–34.0)
MCHC: 30.4 g/dL (ref 30.0–36.0)
MCV: 87.4 fL (ref 80.0–100.0)
Platelets: 410 10*3/uL — ABNORMAL HIGH (ref 150–400)
RBC: 2.94 MIL/uL — ABNORMAL LOW (ref 3.87–5.11)
RDW: 14.3 % (ref 11.5–15.5)
WBC: 12.1 10*3/uL — ABNORMAL HIGH (ref 4.0–10.5)
nRBC: 0 % (ref 0.0–0.2)

## 2018-09-17 LAB — GLUCOSE, CAPILLARY
Glucose-Capillary: 100 mg/dL — ABNORMAL HIGH (ref 70–99)
Glucose-Capillary: 89 mg/dL (ref 70–99)

## 2018-09-17 LAB — BPAM RBC
Blood Product Expiration Date: 202010072359
ISSUE DATE / TIME: 202009051904
Unit Type and Rh: 5100

## 2018-09-17 NOTE — Discharge Summary (Signed)
Physician Discharge Summary  Susan Horton YCX:[id number] DOB: 1970-06-18 DOA: 09/16/2018  PCP: Charlott Rakes, MD  Admit date: 09/16/2018 Discharge date: 09/17/2018  Admitted From: Home Disposition: Home  Recommendations for Outpatient Follow-up:  1. Follow-up with regular dialysis center on 9/7 for scheduled dialysis 2. Repeat CBC on follow-up at dialysis center.  Discharge Condition: Stable CODE STATUS: Full code Diet recommendation: Heart healthy, carb modified  Brief/Interim Summary: 48 year old female with a history of end-stage renal disease who was previously on peritoneal dialysis, but had malfunctioning peritoneal dialysis catheter and was admitted to [facility] for further operative management of her dialysis catheter.  At that time, she was noted to have significant adhesions around her small bowel with her colon and dialysis catheter.  Her dialysis catheter was repaired, but she is currently undergoing hemodialysis until cleared by her nephrologist.  At the time of her discharge from [facility], her hemoglobin was noted to be 7.2 on 9/2.  Since her discharge, she felt increasingly weak.  She denies any chest pain or shortness of breath.  She came to the hospital for further evaluation of her hemoglobin was noted to be 6.8.  It was thought that her anemia was secondary to her underlying renal disease as well as likely blood loss related surgery.  She denies any dark stools/hematochezia.  Patient was admitted to the hospital and transfused 1 unit of PRBC.  Follow-up hemoglobin improved to 7.8.  She is feeling mildly better since admission, but still has some weakness.  Case reviewed with Dr. Jonnie Finner who did not recommend any further transfusion of PRBC.  Patient is not short of breath, has no signs of volume overload and potassium is in normal range.  It is felt appropriate for patient to follow-up tomorrow with her regular dialysis center for scheduled  dialysis.  CBC can be followed at the dialysis center.  Discharge Diagnoses:  Active Problems:   Uncontrolled type 2 diabetes mellitus with peripheral neuropathy (HCC)   Gastroparesis   Essential hypertension, benign   Chronic diastolic CHF (congestive heart failure) (HCC)   ESRD on dialysis [facility])   Symptomatic anemia    Discharge Instructions  Discharge Instructions    Diet - low sodium heart healthy   Complete by: As directed    Increase activity slowly   Complete by: As directed      Allergies as of 09/17/2018      Reactions   Penicillins Anaphylaxis, Itching, Swelling, Rash, Other (See Comments)   Swelling of throat & whole mouth  Has patient had a PCN reaction causing immediate rash, facial/tongue/throat swelling, SOB or lightheadedness with hypotension: Yes Has patient had a PCN reaction causing severe rash involving mucus membranes or skin necrosis: No Has patient had a PCN reaction that required hospitalization: No Has patient had a PCN reaction occurring within the last 10 years: No If all of the above answers are "NO", then may proceed with Cephalosporin use.   Repatha [evolocumab] Itching   Lisinopril Cough      Medication List    STOP taking these medications   capsicum 0.075 % topical cream Commonly known as: ZOSTRIX   lidocaine 5 % ointment Commonly known as: XYLOCAINE     TAKE these medications   albuterol 108 (90 Base) MCG/ACT inhaler Commonly known as: VENTOLIN HFA Inhale 1-2 puffs into the lungs every 6 (six) hours as needed for wheezing or shortness of breath.   aspirin 81 MG chewable tablet Chew 1 tablet (  81 mg total) by mouth daily.   calcitRIOL 0.5 MCG capsule Commonly known as: ROCALTROL Take 1.5 mcg by mouth daily.   calcium acetate 667 MG capsule Commonly known as: PHOSLO Take 1,334-2,001 mg by mouth See admin instructions. Take three capsules (2001 mg) by mouth up to three times daily with meals and two capsules (1334 mg) with  snacks   clopidogrel 75 MG tablet Commonly known as: PLAVIX Take 1 tablet (75 mg total) by mouth daily with breakfast.   Fluticasone-Salmeterol 250-50 MCG/DOSE Aepb Commonly known as: ADVAIR Inhale 1 puff into the lungs 2 (two) times daily. What changed:   when to take this  reasons to take this   FreeStyle Libre 14 Day Sensor Misc 1 each by Does not apply route every 14 (fourteen) days. Change every 2 weeks   gentamicin cream 0.1 % Commonly known as: GARAMYCIN Apply 1 application topically See admin instructions. Apply to access site nightly   HYDROcodone-acetaminophen 5-325 MG tablet Commonly known as: NORCO/VICODIN Take 1 tablet by mouth every 6 (six) hours as needed for moderate pain.   Insulin Glargine (1 Unit Dial) 300 UNIT/ML Sopn Commonly known as: Toujeo SoloStar Inject 25 Units into the skin 2 (two) times a day. What changed: how much to take   insulin regular 100 units/mL injection Commonly known as: HumuLIN R Inject 0.2-0.25 mLs (20-25 Units total) into the skin See admin instructions. What changed:   how much to take  when to take this  additional instructions   Iron 325 (65 Fe) MG Tabs Take 325 mg by mouth daily.   isosorbide mononitrate 30 MG 24 hr tablet Commonly known as: IMDUR Take 0.5 tablets (15 mg total) by mouth daily.   lidocaine-prilocaine cream Commonly known as: EMLA Apply 1 application topically See admin instructions. Apply small amount to access site (AVF) 30 minutes before hemodialysis, cover with occlusive dressing (Saran Wrap) if going out to dialysis center   lovastatin 20 MG tablet Commonly known as: MEVACOR Take 20 mg by mouth at bedtime.   methocarbamol 500 MG tablet Commonly known as: Robaxin Take 1 tablet (500 mg total) by mouth 2 (two) times daily as needed for muscle spasms.   metoCLOPramide 5 MG tablet Commonly known as: REGLAN TAKE 1 TABLET BY MOUTH 4 TIMES DAILY BEFORE MEALS AND AT BEDTIME. What changed: See  the new instructions.   metoprolol succinate 50 MG 24 hr tablet Commonly known as: TOPROL-XL Take 1 tablet (50 mg total) by mouth daily.   montelukast 10 MG tablet Commonly known as: SINGULAIR Take 1 tablet (10 mg total) by mouth at bedtime. What changed:   when to take this  reasons to take this   multivitamin Tabs tablet Take 1 tablet by mouth at bedtime.   nitroGLYCERIN 0.4 MG SL tablet Commonly known as: Nitrostat Place 1 tablet (0.4 mg total) under the tongue every 5 (five) minutes as needed for chest pain.   pantoprazole 40 MG tablet Commonly known as: PROTONIX TAKE 1 TABLET BY MOUTH DAILY.   senna-docusate 8.6-50 MG tablet Commonly known as: Senokot-S Take 1 tablet by mouth 2 (two) times daily. What changed:   when to take this  reasons to take this   trimethoprim-polymyxin b ophthalmic solution Commonly known as: POLYTRIM Place 1 drop into both eyes See admin instructions. Instill one drop into both eyes four times daily for 2 days after each monthly eye injection       Allergies  Allergen Reactions  . Penicillins Anaphylaxis,  Itching, Swelling, Rash and Other (See Comments)    Swelling of throat & whole mouth  Has patient had a PCN reaction causing immediate rash, facial/tongue/throat swelling, SOB or lightheadedness with hypotension: Yes Has patient had a PCN reaction causing severe rash involving mucus membranes or skin necrosis: No Has patient had a PCN reaction that required hospitalization: No Has patient had a PCN reaction occurring within the last 10 years: No If all of the above answers are "NO", then may proceed with Cephalosporin use.  . Repatha [Evolocumab] Itching  . Lisinopril Cough    Consultations:     Procedures/Studies: Dg Abd 1 View  Result Date: 08/25/2018 CLINICAL DATA:  Constipation EXAM: ABDOMEN - 1 VIEW COMPARISON:  03/17/17 FINDINGS: Scattered large and small bowel gas is noted. Dialysis catheter is noted within the left  hemipelvis. Changes of prior cholecystectomy are seen. No other focal abnormality is noted. IMPRESSION: Peritoneal dialysis catheter within the left hemipelvis. No other focal abnormality is seen. Electronically Signed   By: Inez Catalina M.D.   On: 08/25/2018 13:46   Dg Chest Portable 1 View  Result Date: 09/16/2018 CLINICAL DATA:  Anemia, end-stage renal disease on dialysis, coronary artery disease, diabetes mellitus, former smoker EXAM: PORTABLE CHEST 1 VIEW COMPARISON:  Portable exam 1648 hours compared to 09/14/2017 FINDINGS: Borderline enlargement of cardiac silhouette. Mediastinal contours and pulmonary vascularity normal. Lungs clear. No pleural effusion or pneumothorax. Osseous structures unremarkable. IMPRESSION: No acute abnormalities. Electronically Signed   By: Lavonia Dana M.D.   On: 09/16/2018 16:58       Subjective: Overall she feels improved since admission.  Still feels somewhat weak.  Denies any shortness of breath.  No vomiting or diarrhea.  Discharge Exam: Vitals:   09/16/18 2140 09/16/18 2200 09/17/18 0520 09/17/18 0853  BP: (!) 168/72 (!) 158/80 137/60 (!) 143/78  Pulse: (!) 103 (!) 105 96 93  Resp: 18 20 18 18   Temp: 98.7 F (37.1 C) 98 F (36.7 C) 97.9 F (36.6 C) 98.5 F (36.9 C)  TempSrc: Oral Oral  Oral  SpO2: 99% 99% 99% 100%  Weight: 81 kg     Height: 5\' 5"  (1.651 m)       General: Pt is alert, awake, not in acute distress Cardiovascular: RRR, S1/S2 +, no rubs, no gallops Respiratory: CTA bilaterally, no wheezing, no rhonchi Abdominal: Soft, NT, ND, bowel sounds + Extremities: no edema, no cyanosis    The results of significant diagnostics from this hospitalization (including imaging, microbiology, ancillary and laboratory) are listed below for reference.     Microbiology: Recent Results (from the past 240 hour(s))  SARS Coronavirus 2 [facility] order, Performed in [facility] hospital lab) Nasopharyngeal Nasopharyngeal Swab     Status: None    Collection Time: 09/16/18  4:22 PM   Specimen: Nasopharyngeal Swab  Result Value Ref Range Status   SARS Coronavirus 2 NEGATIVE NEGATIVE Final    Comment: (NOTE) If result is NEGATIVE SARS-CoV-2 target nucleic acids are NOT DETECTED. The SARS-CoV-2 RNA is generally detectable in upper and lower  respiratory specimens during the acute phase of infection. The lowest  concentration of SARS-CoV-2 viral copies this assay can detect is 250  copies / mL. A negative result does not preclude SARS-CoV-2 infection  and should not be used as the sole basis for treatment or other  patient management decisions.  A negative result may occur with  improper specimen collection / handling, submission of specimen other  than nasopharyngeal swab, presence of  viral mutation(s) within the  areas targeted by this assay, and inadequate number of viral copies  (<250 copies / mL). A negative result must be combined with clinical  observations, patient history, and epidemiological information. If result is POSITIVE SARS-CoV-2 target nucleic acids are DETECTED. The SARS-CoV-2 RNA is generally detectable in upper and lower  respiratory specimens dur ing the acute phase of infection.  Positive  results are indicative of active infection with SARS-CoV-2.  Clinical  correlation with patient history and other diagnostic information is  necessary to determine patient infection status.  Positive results do  not rule out bacterial infection or co-infection with other viruses. If result is PRESUMPTIVE POSTIVE SARS-CoV-2 nucleic acids MAY BE PRESENT.   A presumptive positive result was obtained on the submitted specimen  and confirmed on repeat testing.  While 2019 novel coronavirus  (SARS-CoV-2) nucleic acids may be present in the submitted sample  additional confirmatory testing may be necessary for epidemiological  and / or clinical management purposes  to differentiate between  SARS-CoV-2 and other Sarbecovirus  currently known to infect humans.  If clinically indicated additional testing with an alternate test  methodology [phone]) is advised. The SARS-CoV-2 RNA is generally  detectable in upper and lower respiratory sp ecimens during the acute  phase of infection. The expected result is Negative. Fact Sheet for Patients:  StrictlyIdeas.no Fact Sheet for Healthcare Providers: BankingDealers.co.za This test is not yet approved or cleared by the [place] FDA and has been authorized for detection and/or diagnosis of SARS-CoV-2 by FDA under an Emergency Use Authorization (EUA).  This EUA will remain in effect (meaning this test can be used) for the duration of the COVID-19 declaration under Section 564(b)(1) of the Act, 21 U.S.C. section 360bbb-3(b)(1), unless the authorization is terminated or revoked sooner. Performed at [facility], [place] [address] [place], [place] [address]      Labs: BNP (last 3 results) No results for input(s): BNP in the last 8760 hours. Basic Metabolic Panel: Recent Labs  Lab 09/16/18 1650 09/17/18 0537  NA 136 139  K 4.4 4.2  CL 102 106  CO2 23 22  GLUCOSE 166* 115*  BUN 49* 49*  CREATININE 8.55* 8.33*  CALCIUM 8.2* 8.4*   Liver Function Tests: Recent Labs  Lab 09/16/18 1650  AST 11*  ALT 11  ALKPHOS 83  BILITOT 0.4  PROT 6.5  ALBUMIN 2.7*   No results for input(s): LIPASE, AMYLASE in the last 168 hours. No results for input(s): AMMONIA in the last 168 hours. CBC: Recent Labs  Lab 09/16/18 1650 09/17/18 0537  WBC 12.4* 12.1*  NEUTROABS 8.3*  --   HGB 6.8* 7.8*  HCT 23.4* 25.7*  MCV 88.6 87.4  PLT 415* 410*   Cardiac Enzymes: No results for input(s): CKTOTAL, CKMB, CKMBINDEX, TROPONINI in the last 168 hours. BNP: Invalid input(s): POCBNP CBG: Recent Labs  Lab 09/17/18 0704 09/17/18 1131  GLUCAP 100* 89   D-Dimer No results for input(s): DDIMER in the last 72 hours. Hgb  A1c No results for input(s): HGBA1C in the last 72 hours. Lipid Profile No results for input(s): CHOL, HDL, LDLCALC, TRIG, CHOLHDL, LDLDIRECT in the last 72 hours. Thyroid function studies No results for input(s): TSH, T4TOTAL, T3FREE, THYROIDAB in the last 72 hours.  Invalid input(s): FREET3 Anemia work up No results for input(s): VITAMINB12, FOLATE, FERRITIN, TIBC, IRON, RETICCTPCT in the last 72 hours. Urinalysis    Component Value Date/Time   COLORURINE YELLOW 03/17/2017 0034  APPEARANCEUR CLEAR 03/17/2017 0034   LABSPEC 1.011 03/17/2017 0034   PHURINE 6.0 03/17/2017 0034   GLUCOSEU >=500 (A) 03/17/2017 0034   HGBUR NEGATIVE 03/17/2017 0034   BILIRUBINUR NEGATIVE 03/17/2017 0034   BILIRUBINUR negative 01/13/2016 1135   KETONESUR NEGATIVE 03/17/2017 0034   PROTEINUR >=300 (A) 03/17/2017 0034   UROBILINOGEN 0.2 01/13/2016 1135   UROBILINOGEN 0.2 06/20/2014 1120   NITRITE NEGATIVE 03/17/2017 0034   LEUKOCYTESUR NEGATIVE 03/17/2017 0034   Sepsis Labs Invalid input(s): PROCALCITONIN,  WBC,  LACTICIDVEN Microbiology Recent Results (from the past 240 hour(s))  SARS Coronavirus 2 [facility] order, Performed in [facility] hospital lab) Nasopharyngeal Nasopharyngeal Swab     Status: None   Collection Time: 09/16/18  4:22 PM   Specimen: Nasopharyngeal Swab  Result Value Ref Range Status   SARS Coronavirus 2 NEGATIVE NEGATIVE Final    Comment: (NOTE) If result is NEGATIVE SARS-CoV-2 target nucleic acids are NOT DETECTED. The SARS-CoV-2 RNA is generally detectable in upper and lower  respiratory specimens during the acute phase of infection. The lowest  concentration of SARS-CoV-2 viral copies this assay can detect is 250  copies / mL. A negative result does not preclude SARS-CoV-2 infection  and should not be used as the sole basis for treatment or other  patient management decisions.  A negative result may occur with  improper specimen collection / handling, submission of  specimen other  than nasopharyngeal swab, presence of viral mutation(s) within the  areas targeted by this assay, and inadequate number of viral copies  (<250 copies / mL). A negative result must be combined with clinical  observations, patient history, and epidemiological information. If result is POSITIVE SARS-CoV-2 target nucleic acids are DETECTED. The SARS-CoV-2 RNA is generally detectable in upper and lower  respiratory specimens dur ing the acute phase of infection.  Positive  results are indicative of active infection with SARS-CoV-2.  Clinical  correlation with patient history and other diagnostic information is  necessary to determine patient infection status.  Positive results do  not rule out bacterial infection or co-infection with other viruses. If result is PRESUMPTIVE POSTIVE SARS-CoV-2 nucleic acids MAY BE PRESENT.   A presumptive positive result was obtained on the submitted specimen  and confirmed on repeat testing.  While 2019 novel coronavirus  (SARS-CoV-2) nucleic acids may be present in the submitted sample  additional confirmatory testing may be necessary for epidemiological  and / or clinical management purposes  to differentiate between  SARS-CoV-2 and other Sarbecovirus currently known to infect humans.  If clinically indicated additional testing with an alternate test  methodology [phone] is advised. The SARS-CoV-2 RNA is generally  detectable in upper and lower respiratory sp ecimens during the acute  phase of infection. The expected result is Negative. Fact Sheet for Patients:  StrictlyIdeas.no Fact Sheet for Healthcare Providers: BankingDealers.co.za This test is not yet approved or cleared by the [place] FDA and has been authorized for detection and/or diagnosis of SARS-CoV-2 by FDA under an Emergency Use Authorization (EUA).  This EUA will remain in effect (meaning this test can be used) for the  duration of the COVID-19 declaration under Section 564(b)(1) of the Act, 21 U.S.C. section 360bbb-3(b)(1), unless the authorization is terminated or revoked sooner. Performed at [facility], [place] [address] [place], [place] [address]      Time coordinating discharge: 8mins  SIGNED:   Kathie Dike, MD  [facility] 09/17/2018, 11:39 AM   If 7PM-7AM, please contact night-coverage [email or web address]

## 2018-09-17 NOTE — Progress Notes (Signed)
DISCHARGE NOTE  Susan Horton to be discharged Home per MD order. Patient verbalized understanding.  Skin clean, dry and intact without evidence of skin break down, no evidence of skin tears noted. IV catheter discontinued intact. Site without signs and symptoms of complications. Dressing and pressure applied. Pt denies pain at the site currently. No complaints noted.  Patient free of lines, drains, and wounds.   Discharge packet assembled. An After Visit Summary (AVS) was printed and given to the patient. Patient escorted via wheelchair and discharged to home via private auto.   Babs Sciara, RN

## 2018-09-17 NOTE — Plan of Care (Signed)
  Problem: Education: Goal: Knowledge of General Education information will improve Description Including pain rating scale, medication(s)/side effects and non-pharmacologic comfort measures Outcome: Progressing   

## 2018-09-17 NOTE — Progress Notes (Signed)
New Admission Note:  Arrival Method: Stretcher from ED Mental Orientation: Responds to voice, A&Ox4 Telemetry: N/A Assessment: Completed Skin: R sided abd incisions x3 IV: PIV R AC NSL Pain: 4/10 Tubes: L sided PD cath, capped and clamped Safety Measures: Safety Fall Prevention Plan was discussed  Admission: Completed Belongings: Clothing in bag at bedside, cellphone Unit Orientation: Patient has been orientated to the room, unit, and the staff.  Orders have been reviewed and implemented. Call light has been placed within reach and bed alarm has been activated. Will continue to monitor the patient.  Percell Boston, RN

## 2018-09-18 DIAGNOSIS — D631 Anemia in chronic kidney disease: Secondary | ICD-10-CM | POA: Diagnosis not present

## 2018-09-18 DIAGNOSIS — D509 Iron deficiency anemia, unspecified: Secondary | ICD-10-CM | POA: Diagnosis not present

## 2018-09-18 DIAGNOSIS — N186 End stage renal disease: Secondary | ICD-10-CM | POA: Diagnosis not present

## 2018-09-18 DIAGNOSIS — Z992 Dependence on renal dialysis: Secondary | ICD-10-CM | POA: Diagnosis not present

## 2018-09-18 DIAGNOSIS — Z79899 Other long term (current) drug therapy: Secondary | ICD-10-CM | POA: Diagnosis not present

## 2018-09-19 ENCOUNTER — Encounter: Payer: Self-pay | Admitting: Vascular Surgery

## 2018-09-19 ENCOUNTER — Other Ambulatory Visit: Payer: Self-pay

## 2018-09-19 ENCOUNTER — Encounter: Payer: Self-pay | Admitting: Physical Medicine & Rehabilitation

## 2018-09-19 ENCOUNTER — Encounter: Payer: Medicare Other | Attending: Physical Medicine & Rehabilitation | Admitting: Physical Medicine & Rehabilitation

## 2018-09-19 ENCOUNTER — Ambulatory Visit (INDEPENDENT_AMBULATORY_CARE_PROVIDER_SITE_OTHER): Payer: Medicare Other | Admitting: Vascular Surgery

## 2018-09-19 VITALS — BP 129/69 | HR 89 | Temp 97.8°F | Resp 16 | Ht 65.0 in | Wt 171.0 lb

## 2018-09-19 VITALS — BP 136/86 | HR 87 | Temp 97.7°F | Ht 65.0 in | Wt 171.0 lb

## 2018-09-19 DIAGNOSIS — G479 Sleep disorder, unspecified: Secondary | ICD-10-CM | POA: Diagnosis not present

## 2018-09-19 DIAGNOSIS — E1142 Type 2 diabetes mellitus with diabetic polyneuropathy: Secondary | ICD-10-CM | POA: Diagnosis not present

## 2018-09-19 DIAGNOSIS — D509 Iron deficiency anemia, unspecified: Secondary | ICD-10-CM | POA: Diagnosis not present

## 2018-09-19 DIAGNOSIS — I739 Peripheral vascular disease, unspecified: Secondary | ICD-10-CM

## 2018-09-19 DIAGNOSIS — D631 Anemia in chronic kidney disease: Secondary | ICD-10-CM | POA: Diagnosis not present

## 2018-09-19 DIAGNOSIS — N186 End stage renal disease: Secondary | ICD-10-CM | POA: Diagnosis not present

## 2018-09-19 DIAGNOSIS — R269 Unspecified abnormalities of gait and mobility: Secondary | ICD-10-CM | POA: Diagnosis not present

## 2018-09-19 DIAGNOSIS — Z79899 Other long term (current) drug therapy: Secondary | ICD-10-CM | POA: Diagnosis not present

## 2018-09-19 DIAGNOSIS — I6522 Occlusion and stenosis of left carotid artery: Secondary | ICD-10-CM | POA: Diagnosis not present

## 2018-09-19 DIAGNOSIS — Z992 Dependence on renal dialysis: Secondary | ICD-10-CM | POA: Diagnosis not present

## 2018-09-19 MED ORDER — AMITRIPTYLINE HCL 10 MG PO TABS: 10.0000 mg | ORAL_TABLET | Freq: Every day | ORAL | 1 refills | Status: DC

## 2018-09-19 MED ORDER — PREGABALIN 75 MG PO CAPS: 75.0000 mg | ORAL_CAPSULE | Freq: Every day | ORAL | 1 refills | Status: DC

## 2018-09-19 NOTE — Progress Notes (Signed)
Patient name: Susan Horton MRN: [id number] DOB: 04-27-1970 Sex: female  REASON FOR CONSULT: Evaluate lower extremity arterial blockage  HPI: Susan Horton is a 48 y.o. female, with multiple medical problems including end-stage renal disease as well as diabetes and severe peripheral neuropathy who presents for lower extremity peripheral vascular disease.  Patient states she is under the impression that after her transplant evaluation in [place] she was referred here to have the arteries in her legs treated prior to transplant.  The referral was from Dr. Jimmy Footman and states evaluate lower extremity blockage.  Patient states she has a long history of neuropathy in her feet that has been ongoing for at least 10 years.  She has a lot of burning and stinging in her feet.  She feels like this has gotten little bit worse recently.  This is managed with Lyrica and other creams.   Otherwise when she walks feels that her legs "give our sometimes."  No burning in calves or thighs specifically.  Past Medical History:  Diagnosis Date   Anemia    Angio-edema    Asthma    CAD (coronary artery disease)    DES to mid LAD July 2018, residual moderate RCA disease   Cataract    CKD (chronic kidney disease) stage 4, GFR 15-29 ml/min (HCC)    Dialysis T/Th/Sa   DVT (deep venous thrombosis) ([place])    1996 during pregnancy, 2015 left leg   Eczema    Essential hypertension 12/11/2015   Gastroparesis    GERD (gastroesophageal reflux disease)    Hidradenitis    Migraine    Neuropathy    Peripheral vascular disease (HCC)    blood clot in leg   Type 2 diabetes mellitus ([place])    Type II diabetes mellitus ([place])    Urticaria     Past Surgical History:  Procedure Laterality Date   A/V FISTULAGRAM N/A 06/22/2017   Procedure: A/V FISTULAGRAM - left arm;  Surgeon: [name], MD;  Location: [place];  Service: Cardiovascular;  Laterality: N/A;   ABDOMINOPLASTY      ADENOIDECTOMY     APPENDECTOMY  1995   AV FISTULA PLACEMENT Left 02/01/2017   Procedure: ARTERIOVENOUS BRACHIOCEPHALIC (AV) FISTULA CREATION;  Surgeon: [name], MD;  Location: [facility] OR;  Service: Vascular;  Laterality: Left;   [place] INTERVENTION N/A 08/05/2016   Procedure: Coronary Stent Intervention;  Surgeon: [name], MD;  Location: [place] [facility];  Service: Cardiovascular;  Laterality: N/A;   EXPLORATORY LAPAROTOMY  08/14/2005   lysis of adhesions, drainage of tubo-ovarian abscess   FISTULA SUPERFICIALIZATION Left 09/14/2017   Procedure: FISTULA SUPERFICIALIZATION ARTERIOVENOUS FISTULA LEFT ARM;  Surgeon: [name], MD;  Location: [place];  Service: Vascular;  Laterality: Left;   HYDRADENITIS EXCISION  02/08/2011   Procedure: EXCISION HYDRADENITIS GROIN;  Surgeon: [name], MD;  Location: [place];  Service: General;  Laterality: N/A;  Excisioin of Hidradenitis Left groin   INGUINAL HIDRADENITIS EXCISION  07/06/2010   bilateral   INSERTION OF DIALYSIS CATHETER N/A 09/14/2017   Procedure: INSERTION OF TUNNELED DIALYSIS CATHETER Right Internal Jugular;  Surgeon: [name], MD;  Location: [place];  Service: Vascular;  Laterality: N/A;   LEFT HEART CATH AND CORONARY ANGIOGRAPHY N/A 08/05/2016   Procedure: Left Heart Cath and Coronary Angiography;  Surgeon: [name], MD;  Location: [place] [facility];  Service: Cardiovascular;  Laterality: N/A;   REDUCTION MAMMAPLASTY  2002   TONSILLECTOMY     TUBAL LIGATION  1996   VAGINAL HYSTERECTOMY  08/04/2005   and cysto    Family History  Problem Relation Age of Onset   Allergic rhinitis Mother    Hypertension Father    Allergic rhinitis Father    Hypertension Sister    Allergic rhinitis Sister    Hypertension Brother    Allergic rhinitis Brother    Breast cancer Cousin        x2     SOCIAL HISTORY: Social History   Socioeconomic History   Marital status: Single    Spouse name: Not on file   Number of children: 3   Years of education: 14   Highest education level: Not on file  Occupational History   Occupation: Curator  Social Needs   Financial resource strain: Not on file   Food insecurity    Worry: Not on file    Inability: Not on file   Transportation needs    Medical: Not on file    Non-medical: Not on file  Tobacco Use   Smoking status: Former Smoker    Years: 20.00    Types: Cigarettes    Quit date: 11/2016    Years since quitting: 1.8   Smokeless tobacco: Never Used  Substance and Sexual Activity   Alcohol use: No    Alcohol/week: 0.0 standard drinks   Drug use: No   Sexual activity: Not on file  Lifestyle   Physical activity    Days per week: Not on file    Minutes per session: Not on file   Stress: Not on file  Relationships   Social connections    Talks on phone: Not on file    Gets together: Not on file    Attends religious service: Not on file    Active member of club or organization: Not on file    Attends meetings of clubs or organizations: Not on file    Relationship status: Not on file   Intimate partner violence    Fear of current or ex partner: Not on file    Emotionally abused: Not on file    Physically abused: Not on file    Forced sexual activity: Not on file  Other Topics Concern   Not on file  Social History Narrative   Lives with daughter in a 2 story home.  Has 3 children.  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  Education: college.     Allergies  Allergen Reactions   Penicillins Anaphylaxis, Itching, Swelling, Rash and Other (See Comments)    Swelling of throat & whole mouth  Has patient had a PCN reaction causing immediate rash, facial/tongue/throat swelling, SOB or lightheadedness with hypotension: Yes Has patient had a PCN reaction causing severe  rash involving mucus membranes or skin necrosis: No Has patient had a PCN reaction that required hospitalization: No Has patient had a PCN reaction occurring within the last 10 years: No If all of the above answers are "NO", then may proceed with Cephalosporin use.   Repatha [Evolocumab] Itching   Lisinopril Cough    Current Outpatient Medications  Medication Sig Dispense Refill   albuterol (PROVENTIL HFA;VENTOLIN HFA) 108 (90 BASE) MCG/ACT inhaler Inhale 1-2 puffs into the lungs every 6 (six) hours as needed for wheezing or shortness of breath.      amitriptyline (  ELAVIL) 10 MG tablet Take 1 tablet (10 mg total) by mouth at bedtime. 30 tablet 1   aspirin 81 MG chewable tablet Chew 1 tablet (81 mg total) by mouth daily. 30 tablet 0   calcitRIOL (ROCALTROL) 0.5 MCG capsule Take 1.5 mcg by mouth daily.     calcium acetate (PHOSLO) 667 MG capsule Take 1,334-2,001 mg by mouth See admin instructions. Take three capsules (2001 mg) by mouth up to three times daily with meals and two capsules (1334 mg) with snacks  10   clopidogrel (PLAVIX) 75 MG tablet Take 1 tablet (75 mg total) by mouth daily with breakfast. 90 tablet 3   Ferrous Sulfate (IRON) 325 (65 Fe) MG TABS Take 325 mg by mouth daily. 30 each 3   Fluticasone-Salmeterol (ADVAIR) 250-50 MCG/DOSE AEPB Inhale 1 puff into the lungs 2 (two) times daily. (Patient taking differently: Inhale 1 puff into the lungs daily as needed (shortness of breath). ) 60 each 3   gentamicin cream (GARAMYCIN) 0.1 % Apply 1 application topically See admin instructions. Apply to access site nightly     Insulin Glargine, 1 Unit Dial, (TOUJEO SOLOSTAR) 300 UNIT/ML SOPN Inject 25 Units into the skin 2 (two) times a day. (Patient taking differently: Inject 50 Units into the skin 2 (two) times a day. ) 12 pen 3   insulin regular (HUMULIN R) 100 units/mL injection Inject 0.2-0.25 mLs (20-25 Units total) into the skin See admin instructions. (Patient taking  differently: Inject 7-15 Units into the skin 3 (three) times daily before meals. Per CBG) 45 mL 3   isosorbide mononitrate (IMDUR) 30 MG 24 hr tablet Take 0.5 tablets (15 mg total) by mouth daily. 45 tablet 3   lidocaine-prilocaine (EMLA) cream Apply 1 application topically See admin instructions. Apply small amount to access site (AVF) 30 minutes before hemodialysis, cover with occlusive dressing (Saran Wrap) if going out to dialysis center  5   lovastatin (MEVACOR) 20 MG tablet Take 20 mg by mouth at bedtime.      methocarbamol (ROBAXIN) 500 MG tablet Take 1 tablet (500 mg total) by mouth 2 (two) times daily as needed for muscle spasms. 60 tablet 1   metoCLOPramide (REGLAN) 5 MG tablet TAKE 1 TABLET BY MOUTH 4 TIMES DAILY BEFORE MEALS AND AT BEDTIME. (Patient taking differently: Take 5 mg by mouth 4 (four) times daily -  before meals and at bedtime. ) 90 tablet 0   metoprolol succinate (TOPROL-XL) 50 MG 24 hr tablet Take 1 tablet (50 mg total) by mouth daily. 90 tablet 3   montelukast (SINGULAIR) 10 MG tablet Take 1 tablet (10 mg total) by mouth at bedtime. (Patient taking differently: Take 10 mg by mouth at bedtime as needed (allergies). ) 30 tablet 5   multivitamin (RENA-VIT) TABS tablet Take 1 tablet by mouth at bedtime.  0   nitroGLYCERIN (NITROSTAT) 0.4 MG SL tablet Place 1 tablet (0.4 mg total) under the tongue every 5 (five) minutes as needed for chest pain. 25 tablet 2   pantoprazole (PROTONIX) 40 MG tablet TAKE 1 TABLET BY MOUTH DAILY. (Patient taking differently: Take 40 mg by mouth daily. ) 30 tablet 7   pregabalin (LYRICA) 75 MG capsule Take 1 capsule (75 mg total) by mouth daily. 30 capsule 1   senna-docusate (SENOKOT-S) 8.6-50 MG tablet Take 1 tablet by mouth 2 (two) times daily. (Patient taking differently: Take 1 tablet by mouth daily as needed for moderate constipation. )     trimethoprim-polymyxin b (POLYTRIM) ophthalmic  solution Place 1 drop into both eyes See admin  instructions. Instill one drop into both eyes four times daily for 2 days after each monthly eye injection  12   No current facility-administered medications for this visit.     REVIEW OF SYSTEMS:  [X]  denotes positive finding, [ ]  denotes negative finding Cardiac  Comments:  Chest pain or chest pressure:    Shortness of breath upon exertion:    Short of breath when lying flat:    Irregular heart rhythm:        Vascular    Pain in calf, thigh, or hip brought on by ambulation:    Pain in feet at night that wakes you up from your sleep:  x   Blood clot in your veins:    Leg swelling:         Pulmonary    Oxygen at home:    Productive cough:     Wheezing:         Neurologic    Sudden weakness in arms or legs:     Sudden numbness in arms or legs:     Sudden onset of difficulty speaking or slurred speech:    Temporary loss of vision in one eye:     Problems with dizziness:         Gastrointestinal    Blood in stool:     Vomited blood:         Genitourinary    Burning when urinating:     Blood in urine:        Psychiatric    Major depression:         Hematologic    Bleeding problems:    Problems with blood clotting too easily:        Skin    Rashes or ulcers:        Constitutional    Fever or chills:      PHYSICAL EXAM: Vitals:   09/19/18 1451  BP: 129/69  Pulse: 89  Resp: 16  Temp: 97.8 F (36.6 C)  TempSrc: Temporal  SpO2: 100%  Weight: 171 lb (77.6 kg)  Height: 5\' 5"  (1.651 m)    GENERAL: The patient is a well-nourished female, in no acute distress. The vital signs are documented above. CARDIAC: There is a regular rate and rhythm.  VASCULAR:  Palpable femoral pulses bilaterally No palpable pedal pulses No tissue loss lower extremity PULMONARY: There is good air exchange bilaterally without wheezing or rales. ABDOMEN: Soft and non-tender with normal pitched bowel sounds.  MUSCULOSKELETAL: There are no major deformities or cyanosis. NEUROLOGIC:  No focal weakness or paresthesias are detected. SKIN: There are no ulcers or rashes noted. PSYCHIATRIC: The patient has a normal affect.  DATA:   I dependently reviewed her bilateral lower extremity arterial duplexes and she has evidence of moderate bilateral SFA stenosis.  ABIs are 0.61 on the right and 0.64 on the left with monophasic waveform at the ankles.  Assessment/Plan:  48 year old female who presents for evaluation of peripheral vascular disease.  She is under the impression that the referral is based on transplant optimization and that she needs her femoral arteries treated in order to be a transplant candidate after going to [place].  I have no documentation of this other than a referral from [facility] to evaluate for lower extremity arterial blockages.  She has evidence of bilateral SFA disease with monophasic runoff at the ankles.  She certainly has no symptoms consistent with claudication  or tissue loss on exam.  She does have a lot of tingling in her feet that complicates her picture  and sounds more consistent with her neuropathy that has been chronic 10+ years - but I dont want to ignore this either.  I will reach out to Dr. Jimmy Footman to have some better understanding as to how vascular surgery can best help her moving forward since she seems to be under the impression this needs to be addressed to make her a transplant candidate.  I will clarify and discussed iliac disease is typically the concern, not SFA disease.  I did arrange follow-up with me in 6 months in the interim for ongoing surveillance.  [name], MD [facility] Office: [phone] Pager: [phone]

## 2018-09-19 NOTE — Progress Notes (Signed)
Subjective:    Patient ID: Susan Horton, female    DOB: 06/21/1970, 48 y.o.   MRN: [id number]  HPI Female with pmh of DM with neuropathy, ESRD on HD, CAD, asthma presents with diabetic neuropathy.   Initially stated: Started ~2000, progressively worse in 2017.  B/l feet up to knees and hands.  Denies inciting event.  Burning/tingling.  Constant. Denies alleviating factors.  Ambulation exacerbates pain.  Associated numbness and weakness. Epson, hot/cold do not help.  Denies falls in last year. Pain limits ADLs.  Last clinic visit 10/05/2017.  Since that time, patient went to the hospital recently due medical reasons. Lidocaine ointment and Robaxin did not help. She stopped taking Cymbalta.  Later states, Lidocaine ointment helps. She is using Capsacin cream. She stopped Lyrica, but does not recall why. She had a falls playing with grandchildren. She has not followed up with diabetic shoes. She is occasionally uses cane.    Pain Inventory Average Pain 10 Pain Right Now 10 My pain is sharp, burning, dull, stabbing, tingling and aching  In the last 24 hours, has pain interfered with the following? General activity 10 Relation with others 10 Enjoyment of life 10 What TIME of day is your pain at its worst? all Sleep (in general) Fair  Pain is worse with: walking, bending, sitting, inactivity and standing Pain improves with: rest and pacing activities Relief from Meds: no meds  Mobility use a cane ability to climb steps?  yes do you drive?  yes  Function disabled: date disabled July 2017 I need assistance with the following:  household duties  Neuro/Psych No problems in this area  Prior Studies Any changes since last visit?  no  Physicians involved in your care Any changes since last visit?  no   Family History  Problem Relation Age of Onset  . Allergic rhinitis Mother   . Hypertension Father   . Allergic rhinitis Father   . Hypertension Sister   . Allergic rhinitis  Sister   . Hypertension Brother   . Allergic rhinitis Brother   . Breast cancer Cousin        x2   Social History   Socioeconomic History  . Marital status: Single    Spouse name: Not on file  . Number of children: 3  . Years of education: 11  . Highest education level: Not on file  Occupational History  . Occupation: Curator  Social Needs  . Financial resource strain: Not on file  . Food insecurity    Worry: Not on file    Inability: Not on file  . Transportation needs    Medical: Not on file    Non-medical: Not on file  Tobacco Use  . Smoking status: Former Smoker    Years: 20.00    Types: Cigarettes    Quit date: 11/2016    Years since quitting: 1.8  . Smokeless tobacco: Never Used  Substance and Sexual Activity  . Alcohol use: No    Alcohol/week: 0.0 standard drinks  . Drug use: No  . Sexual activity: Not on file  Lifestyle  . Physical activity    Days per week: Not on file    Minutes per session: Not on file  . Stress: Not on file  Relationships  . Social Herbalist on phone: Not on file    Gets together: Not on file    Attends religious service: Not on file    Active member of club  or organization: Not on file    Attends meetings of clubs or organizations: Not on file    Relationship status: Not on file  Other Topics Concern  . Not on file  Social History Narrative   Lives with daughter in a 2 story home.  Has 3 children.  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  Education: college.    Past Surgical History:  Procedure Laterality Date  . A/V FISTULAGRAM N/A 06/22/2017   Procedure: A/V FISTULAGRAM - left arm;  Surgeon: [name], MD;  Location: [place] [facility];  Service: Cardiovascular;  Laterality: N/A;  . ABDOMINOPLASTY    . ADENOIDECTOMY    . APPENDECTOMY  1995  . AV FISTULA PLACEMENT Left 02/01/2017   Procedure: ARTERIOVENOUS BRACHIOCEPHALIC (AV) FISTULA CREATION;  Surgeon: [name], MD;  Location: [place];  Service: Vascular;  Laterality: Left;  . CHOLECYSTECTOMY  1993  . CORONARY ANGIOPLASTY WITH STENT PLACEMENT    . CORONARY STENT INTERVENTION N/A 08/05/2016   Procedure: Coronary Stent Intervention;  Surgeon: [name], MD;  Location: [place] [facility];  Service: Cardiovascular;  Laterality: N/A;  . EXPLORATORY LAPAROTOMY  08/14/2005   lysis of adhesions, drainage of tubo-ovarian abscess  . FISTULA SUPERFICIALIZATION Left 09/14/2017   Procedure: FISTULA SUPERFICIALIZATION ARTERIOVENOUS FISTULA LEFT ARM;  Surgeon: [name], MD;  Location: [place];  Service: Vascular;  Laterality: Left;  . HYDRADENITIS EXCISION  02/08/2011   Procedure: EXCISION HYDRADENITIS GROIN;  Surgeon: [name], MD;  Location: [place];  Service: General;  Laterality: N/A;  Excisioin of Hidradenitis Left groin  . INGUINAL HIDRADENITIS EXCISION  07/06/2010   bilateral  . INSERTION OF DIALYSIS CATHETER N/A 09/14/2017   Procedure: INSERTION OF TUNNELED DIALYSIS CATHETER Right Internal Jugular;  Surgeon: [name], MD;  Location: [place];  Service: Vascular;  Laterality: N/A;  . LEFT HEART CATH AND CORONARY ANGIOGRAPHY N/A 08/05/2016   Procedure: Left Heart Cath and Coronary Angiography;  Surgeon: [name], MD;  Location: [place] [facility];  Service: Cardiovascular;  Laterality: N/A;  . REDUCTION MAMMAPLASTY  2002  . TONSILLECTOMY    . TUBAL LIGATION  1996  . VAGINAL HYSTERECTOMY  08/04/2005   and cysto   Past Medical History:  Diagnosis Date  . Anemia   . Angio-edema   . Asthma   . CAD (coronary artery disease)    DES to mid LAD July 2018, residual moderate RCA disease  . Cataract   . CKD (chronic kidney disease) stage 4, GFR 15-29 ml/min (HCC)    Dialysis T/Th/Sa  . DVT (deep venous thrombosis) ([place])    1996 during pregnancy, 2015 left leg  . Eczema   . Essential hypertension 12/11/2015  . Gastroparesis   . GERD  (gastroesophageal reflux disease)   . Hidradenitis   . Migraine   . Neuropathy   . Peripheral vascular disease ([place])    blood clot in leg  . Type 2 diabetes mellitus ([place])   . Type II diabetes mellitus ([place])   . Urticaria    BP 136/86   Pulse 87   Temp 97.7 F (36.5 C)   Ht 5\' 5"  (1.651 m)   Wt 171 lb (77.6 kg)   SpO2 98%   BMI 28.46 kg/m   Opioid Risk Score:   Fall Risk Score:  `1  Depression screen [facility] 2/9  Depression screen [facility] 2/9 03/22/2018 10/05/2017 09/07/2017 06/10/2017 03/10/2016 01/21/2016 12/11/2015  Decreased Interest 1 0 0 1 2 2 1   Down, Depressed, Hopeless 0 0 0 1 1 1 1   PHQ - 2 Score 1 0 0 2 3 3 2   Altered sleeping 1 - 1 3 1 3 2   Tired, decreased energy 1 - 2 3 2 2 3   Change in appetite 1 - 0 3 1 2 3   Feeling bad or failure about yourself  0 - 0 1 1 1  0  Trouble concentrating 0 - 0 1 2 0 1  Moving slowly or fidgety/restless 0 - 0 0 0 0 1  Suicidal thoughts 0 - 0 0 0 0 0  PHQ-9 Score 4 - 3 13 10 11 12   Difficult doing work/chores - - Somewhat difficult - - - -  Some recent data might be hidden     Review of Systems  Constitutional: Positive for appetite change.  HENT: Negative.   Eyes: Negative.   Respiratory: Negative.   Cardiovascular: Negative.   Gastrointestinal: Positive for nausea and vomiting.  Endocrine:       Diabetic   Genitourinary: Negative.   Musculoskeletal: Positive for gait problem and myalgias.  Skin: Negative.   Allergic/Immunologic: Negative.   Neurological: Positive for weakness and numbness.  Hematological: Negative.   Psychiatric/Behavioral: Negative.   All other systems reviewed and are negative.     Objective:   Physical Exam Gen: NAD. Vital signs reviewed HENT: Normocephalic. Atraumatic. Eyes: EOMI. No discharge.  Cardio: No JVD. Pulm: Effort normal. No stridor. Abd: Nondistended MSK:  Gait mildly antalgic.   TTP b/l LE.    No edema.  Neuro:  Sensation diminished to light touch in distal b/l knees   Strength   4/5 in all LE myotomes  Skin: Warm and Dry. Intact    Assessment & Plan:  Female with pmh of DM with neuropathy, ESRD on HD, CAD, asthma presents with diabetic neuropathy.    1. Diabetic polyneuropathy  Lidoderm, Gabapentin, TENS, Flexaril, Epson salt baths Cymbalta, Robaxin ineffective  Cont Lidocaine ointment   Will reorder Lyrica 75 daily  Cont Capsacin cream  Will consider referral for baths  Will consider compound medications  Will consider Anodyne therapy  Encouraged follow up for diabetic shoes, reminded again  Discussed expectations and coping with certain degree of discomfort  2. Gait abnormality  Encouraged use of cane again  3. Sleep disturbance  No benefit with Pamelor, will d/c  Will order Elavil 10mg   See #1

## 2018-09-20 ENCOUNTER — Encounter: Payer: Self-pay | Admitting: Internal Medicine

## 2018-09-20 DIAGNOSIS — Z79899 Other long term (current) drug therapy: Secondary | ICD-10-CM | POA: Diagnosis not present

## 2018-09-20 DIAGNOSIS — Z992 Dependence on renal dialysis: Secondary | ICD-10-CM | POA: Diagnosis not present

## 2018-09-20 DIAGNOSIS — D631 Anemia in chronic kidney disease: Secondary | ICD-10-CM | POA: Diagnosis not present

## 2018-09-20 DIAGNOSIS — D509 Iron deficiency anemia, unspecified: Secondary | ICD-10-CM | POA: Diagnosis not present

## 2018-09-20 DIAGNOSIS — N186 End stage renal disease: Secondary | ICD-10-CM | POA: Diagnosis not present

## 2018-09-21 DIAGNOSIS — D631 Anemia in chronic kidney disease: Secondary | ICD-10-CM | POA: Diagnosis not present

## 2018-09-21 DIAGNOSIS — N186 End stage renal disease: Secondary | ICD-10-CM | POA: Diagnosis not present

## 2018-09-21 DIAGNOSIS — Z79899 Other long term (current) drug therapy: Secondary | ICD-10-CM | POA: Diagnosis not present

## 2018-09-21 DIAGNOSIS — D509 Iron deficiency anemia, unspecified: Secondary | ICD-10-CM | POA: Diagnosis not present

## 2018-09-21 DIAGNOSIS — Z992 Dependence on renal dialysis: Secondary | ICD-10-CM | POA: Diagnosis not present

## 2018-09-22 DIAGNOSIS — D631 Anemia in chronic kidney disease: Secondary | ICD-10-CM | POA: Diagnosis not present

## 2018-09-22 DIAGNOSIS — D509 Iron deficiency anemia, unspecified: Secondary | ICD-10-CM | POA: Diagnosis not present

## 2018-09-22 DIAGNOSIS — Z79899 Other long term (current) drug therapy: Secondary | ICD-10-CM | POA: Diagnosis not present

## 2018-09-22 DIAGNOSIS — Z992 Dependence on renal dialysis: Secondary | ICD-10-CM | POA: Diagnosis not present

## 2018-09-22 DIAGNOSIS — N186 End stage renal disease: Secondary | ICD-10-CM | POA: Diagnosis not present

## 2018-09-23 DIAGNOSIS — D631 Anemia in chronic kidney disease: Secondary | ICD-10-CM | POA: Diagnosis not present

## 2018-09-23 DIAGNOSIS — D509 Iron deficiency anemia, unspecified: Secondary | ICD-10-CM | POA: Diagnosis not present

## 2018-09-23 DIAGNOSIS — N186 End stage renal disease: Secondary | ICD-10-CM | POA: Diagnosis not present

## 2018-09-23 DIAGNOSIS — Z79899 Other long term (current) drug therapy: Secondary | ICD-10-CM | POA: Diagnosis not present

## 2018-09-23 DIAGNOSIS — Z992 Dependence on renal dialysis: Secondary | ICD-10-CM | POA: Diagnosis not present

## 2018-09-24 DIAGNOSIS — N186 End stage renal disease: Secondary | ICD-10-CM | POA: Diagnosis not present

## 2018-09-24 DIAGNOSIS — D631 Anemia in chronic kidney disease: Secondary | ICD-10-CM | POA: Diagnosis not present

## 2018-09-24 DIAGNOSIS — Z992 Dependence on renal dialysis: Secondary | ICD-10-CM | POA: Diagnosis not present

## 2018-09-24 DIAGNOSIS — Z79899 Other long term (current) drug therapy: Secondary | ICD-10-CM | POA: Diagnosis not present

## 2018-09-24 DIAGNOSIS — D509 Iron deficiency anemia, unspecified: Secondary | ICD-10-CM | POA: Diagnosis not present

## 2018-09-25 ENCOUNTER — Ambulatory Visit: Payer: Medicare Other | Admitting: Family Medicine

## 2018-09-25 DIAGNOSIS — Z992 Dependence on renal dialysis: Secondary | ICD-10-CM | POA: Diagnosis not present

## 2018-09-25 DIAGNOSIS — N186 End stage renal disease: Secondary | ICD-10-CM | POA: Diagnosis not present

## 2018-09-25 DIAGNOSIS — Z79899 Other long term (current) drug therapy: Secondary | ICD-10-CM | POA: Diagnosis not present

## 2018-09-25 DIAGNOSIS — D631 Anemia in chronic kidney disease: Secondary | ICD-10-CM | POA: Diagnosis not present

## 2018-09-25 DIAGNOSIS — D509 Iron deficiency anemia, unspecified: Secondary | ICD-10-CM | POA: Diagnosis not present

## 2018-09-26 DIAGNOSIS — Z992 Dependence on renal dialysis: Secondary | ICD-10-CM | POA: Diagnosis not present

## 2018-09-26 DIAGNOSIS — D509 Iron deficiency anemia, unspecified: Secondary | ICD-10-CM | POA: Diagnosis not present

## 2018-09-26 DIAGNOSIS — D631 Anemia in chronic kidney disease: Secondary | ICD-10-CM | POA: Diagnosis not present

## 2018-09-26 DIAGNOSIS — N186 End stage renal disease: Secondary | ICD-10-CM | POA: Diagnosis not present

## 2018-09-26 DIAGNOSIS — Z79899 Other long term (current) drug therapy: Secondary | ICD-10-CM | POA: Diagnosis not present

## 2018-09-27 DIAGNOSIS — Z79899 Other long term (current) drug therapy: Secondary | ICD-10-CM | POA: Diagnosis not present

## 2018-09-27 DIAGNOSIS — D509 Iron deficiency anemia, unspecified: Secondary | ICD-10-CM | POA: Diagnosis not present

## 2018-09-27 DIAGNOSIS — Z992 Dependence on renal dialysis: Secondary | ICD-10-CM | POA: Diagnosis not present

## 2018-09-27 DIAGNOSIS — N186 End stage renal disease: Secondary | ICD-10-CM | POA: Diagnosis not present

## 2018-09-27 DIAGNOSIS — D631 Anemia in chronic kidney disease: Secondary | ICD-10-CM | POA: Diagnosis not present

## 2018-09-28 DIAGNOSIS — Z79899 Other long term (current) drug therapy: Secondary | ICD-10-CM | POA: Diagnosis not present

## 2018-09-28 DIAGNOSIS — D631 Anemia in chronic kidney disease: Secondary | ICD-10-CM | POA: Diagnosis not present

## 2018-09-28 DIAGNOSIS — Z992 Dependence on renal dialysis: Secondary | ICD-10-CM | POA: Diagnosis not present

## 2018-09-28 DIAGNOSIS — D509 Iron deficiency anemia, unspecified: Secondary | ICD-10-CM | POA: Diagnosis not present

## 2018-09-28 DIAGNOSIS — N186 End stage renal disease: Secondary | ICD-10-CM | POA: Diagnosis not present

## 2018-09-29 DIAGNOSIS — Z992 Dependence on renal dialysis: Secondary | ICD-10-CM | POA: Diagnosis not present

## 2018-09-29 DIAGNOSIS — D631 Anemia in chronic kidney disease: Secondary | ICD-10-CM | POA: Diagnosis not present

## 2018-09-29 DIAGNOSIS — N186 End stage renal disease: Secondary | ICD-10-CM | POA: Diagnosis not present

## 2018-09-29 DIAGNOSIS — Z79899 Other long term (current) drug therapy: Secondary | ICD-10-CM | POA: Diagnosis not present

## 2018-09-29 DIAGNOSIS — D509 Iron deficiency anemia, unspecified: Secondary | ICD-10-CM | POA: Diagnosis not present

## 2018-09-30 DIAGNOSIS — N186 End stage renal disease: Secondary | ICD-10-CM | POA: Diagnosis not present

## 2018-09-30 DIAGNOSIS — Z992 Dependence on renal dialysis: Secondary | ICD-10-CM | POA: Diagnosis not present

## 2018-09-30 DIAGNOSIS — Z79899 Other long term (current) drug therapy: Secondary | ICD-10-CM | POA: Diagnosis not present

## 2018-09-30 DIAGNOSIS — D631 Anemia in chronic kidney disease: Secondary | ICD-10-CM | POA: Diagnosis not present

## 2018-09-30 DIAGNOSIS — D509 Iron deficiency anemia, unspecified: Secondary | ICD-10-CM | POA: Diagnosis not present

## 2018-10-01 DIAGNOSIS — D631 Anemia in chronic kidney disease: Secondary | ICD-10-CM | POA: Diagnosis not present

## 2018-10-01 DIAGNOSIS — Z992 Dependence on renal dialysis: Secondary | ICD-10-CM | POA: Diagnosis not present

## 2018-10-01 DIAGNOSIS — N186 End stage renal disease: Secondary | ICD-10-CM | POA: Diagnosis not present

## 2018-10-01 DIAGNOSIS — D509 Iron deficiency anemia, unspecified: Secondary | ICD-10-CM | POA: Diagnosis not present

## 2018-10-01 DIAGNOSIS — Z79899 Other long term (current) drug therapy: Secondary | ICD-10-CM | POA: Diagnosis not present

## 2018-10-02 DIAGNOSIS — D509 Iron deficiency anemia, unspecified: Secondary | ICD-10-CM | POA: Diagnosis not present

## 2018-10-02 DIAGNOSIS — N186 End stage renal disease: Secondary | ICD-10-CM | POA: Diagnosis not present

## 2018-10-02 DIAGNOSIS — Z992 Dependence on renal dialysis: Secondary | ICD-10-CM | POA: Diagnosis not present

## 2018-10-02 DIAGNOSIS — Z79899 Other long term (current) drug therapy: Secondary | ICD-10-CM | POA: Diagnosis not present

## 2018-10-02 DIAGNOSIS — D631 Anemia in chronic kidney disease: Secondary | ICD-10-CM | POA: Diagnosis not present

## 2018-10-03 DIAGNOSIS — N186 End stage renal disease: Secondary | ICD-10-CM | POA: Diagnosis not present

## 2018-10-03 DIAGNOSIS — D509 Iron deficiency anemia, unspecified: Secondary | ICD-10-CM | POA: Diagnosis not present

## 2018-10-03 DIAGNOSIS — D631 Anemia in chronic kidney disease: Secondary | ICD-10-CM | POA: Diagnosis not present

## 2018-10-03 DIAGNOSIS — Z79899 Other long term (current) drug therapy: Secondary | ICD-10-CM | POA: Diagnosis not present

## 2018-10-03 DIAGNOSIS — Z992 Dependence on renal dialysis: Secondary | ICD-10-CM | POA: Diagnosis not present

## 2018-10-04 DIAGNOSIS — D631 Anemia in chronic kidney disease: Secondary | ICD-10-CM | POA: Diagnosis not present

## 2018-10-04 DIAGNOSIS — D509 Iron deficiency anemia, unspecified: Secondary | ICD-10-CM | POA: Diagnosis not present

## 2018-10-04 DIAGNOSIS — Z992 Dependence on renal dialysis: Secondary | ICD-10-CM | POA: Diagnosis not present

## 2018-10-04 DIAGNOSIS — N186 End stage renal disease: Secondary | ICD-10-CM | POA: Diagnosis not present

## 2018-10-04 DIAGNOSIS — Z79899 Other long term (current) drug therapy: Secondary | ICD-10-CM | POA: Diagnosis not present

## 2018-10-05 DIAGNOSIS — Z992 Dependence on renal dialysis: Secondary | ICD-10-CM | POA: Diagnosis not present

## 2018-10-05 DIAGNOSIS — N186 End stage renal disease: Secondary | ICD-10-CM | POA: Diagnosis not present

## 2018-10-05 DIAGNOSIS — D631 Anemia in chronic kidney disease: Secondary | ICD-10-CM | POA: Diagnosis not present

## 2018-10-05 DIAGNOSIS — D509 Iron deficiency anemia, unspecified: Secondary | ICD-10-CM | POA: Diagnosis not present

## 2018-10-05 DIAGNOSIS — Z79899 Other long term (current) drug therapy: Secondary | ICD-10-CM | POA: Diagnosis not present

## 2018-10-06 DIAGNOSIS — D631 Anemia in chronic kidney disease: Secondary | ICD-10-CM | POA: Diagnosis not present

## 2018-10-06 DIAGNOSIS — Z992 Dependence on renal dialysis: Secondary | ICD-10-CM | POA: Diagnosis not present

## 2018-10-06 DIAGNOSIS — N186 End stage renal disease: Secondary | ICD-10-CM | POA: Diagnosis not present

## 2018-10-06 DIAGNOSIS — D509 Iron deficiency anemia, unspecified: Secondary | ICD-10-CM | POA: Diagnosis not present

## 2018-10-06 DIAGNOSIS — Z79899 Other long term (current) drug therapy: Secondary | ICD-10-CM | POA: Diagnosis not present

## 2018-10-07 DIAGNOSIS — N186 End stage renal disease: Secondary | ICD-10-CM | POA: Diagnosis not present

## 2018-10-07 DIAGNOSIS — D631 Anemia in chronic kidney disease: Secondary | ICD-10-CM | POA: Diagnosis not present

## 2018-10-07 DIAGNOSIS — Z992 Dependence on renal dialysis: Secondary | ICD-10-CM | POA: Diagnosis not present

## 2018-10-07 DIAGNOSIS — Z79899 Other long term (current) drug therapy: Secondary | ICD-10-CM | POA: Diagnosis not present

## 2018-10-07 DIAGNOSIS — D509 Iron deficiency anemia, unspecified: Secondary | ICD-10-CM | POA: Diagnosis not present

## 2018-10-08 DIAGNOSIS — D509 Iron deficiency anemia, unspecified: Secondary | ICD-10-CM | POA: Diagnosis not present

## 2018-10-08 DIAGNOSIS — Z992 Dependence on renal dialysis: Secondary | ICD-10-CM | POA: Diagnosis not present

## 2018-10-08 DIAGNOSIS — Z79899 Other long term (current) drug therapy: Secondary | ICD-10-CM | POA: Diagnosis not present

## 2018-10-08 DIAGNOSIS — N186 End stage renal disease: Secondary | ICD-10-CM | POA: Diagnosis not present

## 2018-10-08 DIAGNOSIS — D631 Anemia in chronic kidney disease: Secondary | ICD-10-CM | POA: Diagnosis not present

## 2018-10-09 DIAGNOSIS — D631 Anemia in chronic kidney disease: Secondary | ICD-10-CM | POA: Diagnosis not present

## 2018-10-09 DIAGNOSIS — D509 Iron deficiency anemia, unspecified: Secondary | ICD-10-CM | POA: Diagnosis not present

## 2018-10-09 DIAGNOSIS — Z992 Dependence on renal dialysis: Secondary | ICD-10-CM | POA: Diagnosis not present

## 2018-10-09 DIAGNOSIS — Z79899 Other long term (current) drug therapy: Secondary | ICD-10-CM | POA: Diagnosis not present

## 2018-10-09 DIAGNOSIS — N186 End stage renal disease: Secondary | ICD-10-CM | POA: Diagnosis not present

## 2018-10-10 DIAGNOSIS — Z992 Dependence on renal dialysis: Secondary | ICD-10-CM | POA: Diagnosis not present

## 2018-10-10 DIAGNOSIS — D631 Anemia in chronic kidney disease: Secondary | ICD-10-CM | POA: Diagnosis not present

## 2018-10-10 DIAGNOSIS — D509 Iron deficiency anemia, unspecified: Secondary | ICD-10-CM | POA: Diagnosis not present

## 2018-10-10 DIAGNOSIS — N186 End stage renal disease: Secondary | ICD-10-CM | POA: Diagnosis not present

## 2018-10-10 DIAGNOSIS — Z79899 Other long term (current) drug therapy: Secondary | ICD-10-CM | POA: Diagnosis not present

## 2018-10-11 DIAGNOSIS — R1110 Vomiting, unspecified: Secondary | ICD-10-CM | POA: Diagnosis not present

## 2018-10-11 DIAGNOSIS — R110 Nausea: Secondary | ICD-10-CM | POA: Diagnosis not present

## 2018-10-11 DIAGNOSIS — N186 End stage renal disease: Secondary | ICD-10-CM | POA: Diagnosis not present

## 2018-10-11 DIAGNOSIS — Z992 Dependence on renal dialysis: Secondary | ICD-10-CM | POA: Diagnosis not present

## 2018-10-11 DIAGNOSIS — D631 Anemia in chronic kidney disease: Secondary | ICD-10-CM | POA: Diagnosis not present

## 2018-10-11 DIAGNOSIS — R112 Nausea with vomiting, unspecified: Secondary | ICD-10-CM | POA: Diagnosis not present

## 2018-10-11 DIAGNOSIS — D509 Iron deficiency anemia, unspecified: Secondary | ICD-10-CM | POA: Diagnosis not present

## 2018-10-11 DIAGNOSIS — K219 Gastro-esophageal reflux disease without esophagitis: Secondary | ICD-10-CM | POA: Diagnosis not present

## 2018-10-11 DIAGNOSIS — Z79899 Other long term (current) drug therapy: Secondary | ICD-10-CM | POA: Diagnosis not present

## 2018-10-12 DIAGNOSIS — N186 End stage renal disease: Secondary | ICD-10-CM | POA: Diagnosis not present

## 2018-10-12 DIAGNOSIS — N2581 Secondary hyperparathyroidism of renal origin: Secondary | ICD-10-CM | POA: Diagnosis not present

## 2018-10-12 DIAGNOSIS — D509 Iron deficiency anemia, unspecified: Secondary | ICD-10-CM | POA: Diagnosis not present

## 2018-10-12 DIAGNOSIS — E1122 Type 2 diabetes mellitus with diabetic chronic kidney disease: Secondary | ICD-10-CM | POA: Diagnosis not present

## 2018-10-12 DIAGNOSIS — D631 Anemia in chronic kidney disease: Secondary | ICD-10-CM | POA: Diagnosis not present

## 2018-10-12 DIAGNOSIS — Z992 Dependence on renal dialysis: Secondary | ICD-10-CM | POA: Diagnosis not present

## 2018-10-13 DIAGNOSIS — N2581 Secondary hyperparathyroidism of renal origin: Secondary | ICD-10-CM | POA: Diagnosis not present

## 2018-10-13 DIAGNOSIS — N186 End stage renal disease: Secondary | ICD-10-CM | POA: Diagnosis not present

## 2018-10-13 DIAGNOSIS — Z992 Dependence on renal dialysis: Secondary | ICD-10-CM | POA: Diagnosis not present

## 2018-10-13 DIAGNOSIS — D509 Iron deficiency anemia, unspecified: Secondary | ICD-10-CM | POA: Diagnosis not present

## 2018-10-13 DIAGNOSIS — D631 Anemia in chronic kidney disease: Secondary | ICD-10-CM | POA: Diagnosis not present

## 2018-10-14 DIAGNOSIS — N2581 Secondary hyperparathyroidism of renal origin: Secondary | ICD-10-CM | POA: Diagnosis not present

## 2018-10-14 DIAGNOSIS — Z992 Dependence on renal dialysis: Secondary | ICD-10-CM | POA: Diagnosis not present

## 2018-10-14 DIAGNOSIS — D631 Anemia in chronic kidney disease: Secondary | ICD-10-CM | POA: Diagnosis not present

## 2018-10-14 DIAGNOSIS — N186 End stage renal disease: Secondary | ICD-10-CM | POA: Diagnosis not present

## 2018-10-14 DIAGNOSIS — D509 Iron deficiency anemia, unspecified: Secondary | ICD-10-CM | POA: Diagnosis not present

## 2018-10-15 DIAGNOSIS — D509 Iron deficiency anemia, unspecified: Secondary | ICD-10-CM | POA: Diagnosis not present

## 2018-10-15 DIAGNOSIS — N2581 Secondary hyperparathyroidism of renal origin: Secondary | ICD-10-CM | POA: Diagnosis not present

## 2018-10-15 DIAGNOSIS — N186 End stage renal disease: Secondary | ICD-10-CM | POA: Diagnosis not present

## 2018-10-15 DIAGNOSIS — D631 Anemia in chronic kidney disease: Secondary | ICD-10-CM | POA: Diagnosis not present

## 2018-10-15 DIAGNOSIS — Z992 Dependence on renal dialysis: Secondary | ICD-10-CM | POA: Diagnosis not present

## 2018-10-16 DIAGNOSIS — Z992 Dependence on renal dialysis: Secondary | ICD-10-CM | POA: Diagnosis not present

## 2018-10-16 DIAGNOSIS — D631 Anemia in chronic kidney disease: Secondary | ICD-10-CM | POA: Diagnosis not present

## 2018-10-16 DIAGNOSIS — N2581 Secondary hyperparathyroidism of renal origin: Secondary | ICD-10-CM | POA: Diagnosis not present

## 2018-10-16 DIAGNOSIS — D509 Iron deficiency anemia, unspecified: Secondary | ICD-10-CM | POA: Diagnosis not present

## 2018-10-16 DIAGNOSIS — N186 End stage renal disease: Secondary | ICD-10-CM | POA: Diagnosis not present

## 2018-10-17 DIAGNOSIS — N2581 Secondary hyperparathyroidism of renal origin: Secondary | ICD-10-CM | POA: Diagnosis not present

## 2018-10-17 DIAGNOSIS — R82998 Other abnormal findings in urine: Secondary | ICD-10-CM | POA: Diagnosis not present

## 2018-10-17 DIAGNOSIS — N186 End stage renal disease: Secondary | ICD-10-CM | POA: Diagnosis not present

## 2018-10-17 DIAGNOSIS — D509 Iron deficiency anemia, unspecified: Secondary | ICD-10-CM | POA: Diagnosis not present

## 2018-10-17 DIAGNOSIS — Z992 Dependence on renal dialysis: Secondary | ICD-10-CM | POA: Diagnosis not present

## 2018-10-17 DIAGNOSIS — D631 Anemia in chronic kidney disease: Secondary | ICD-10-CM | POA: Diagnosis not present

## 2018-10-17 DIAGNOSIS — E7849 Other hyperlipidemia: Secondary | ICD-10-CM | POA: Diagnosis not present

## 2018-10-17 DIAGNOSIS — E1129 Type 2 diabetes mellitus with other diabetic kidney complication: Secondary | ICD-10-CM | POA: Diagnosis not present

## 2018-10-18 DIAGNOSIS — Z992 Dependence on renal dialysis: Secondary | ICD-10-CM | POA: Diagnosis not present

## 2018-10-18 DIAGNOSIS — N2581 Secondary hyperparathyroidism of renal origin: Secondary | ICD-10-CM | POA: Diagnosis not present

## 2018-10-18 DIAGNOSIS — N186 End stage renal disease: Secondary | ICD-10-CM | POA: Diagnosis not present

## 2018-10-18 DIAGNOSIS — D509 Iron deficiency anemia, unspecified: Secondary | ICD-10-CM | POA: Diagnosis not present

## 2018-10-18 DIAGNOSIS — D631 Anemia in chronic kidney disease: Secondary | ICD-10-CM | POA: Diagnosis not present

## 2018-10-19 DIAGNOSIS — D631 Anemia in chronic kidney disease: Secondary | ICD-10-CM | POA: Diagnosis not present

## 2018-10-19 DIAGNOSIS — N2581 Secondary hyperparathyroidism of renal origin: Secondary | ICD-10-CM | POA: Diagnosis not present

## 2018-10-19 DIAGNOSIS — D509 Iron deficiency anemia, unspecified: Secondary | ICD-10-CM | POA: Diagnosis not present

## 2018-10-19 DIAGNOSIS — N186 End stage renal disease: Secondary | ICD-10-CM | POA: Diagnosis not present

## 2018-10-19 DIAGNOSIS — Z992 Dependence on renal dialysis: Secondary | ICD-10-CM | POA: Diagnosis not present

## 2018-10-20 DIAGNOSIS — N186 End stage renal disease: Secondary | ICD-10-CM | POA: Diagnosis not present

## 2018-10-20 DIAGNOSIS — R112 Nausea with vomiting, unspecified: Secondary | ICD-10-CM | POA: Diagnosis not present

## 2018-10-20 DIAGNOSIS — D509 Iron deficiency anemia, unspecified: Secondary | ICD-10-CM | POA: Diagnosis not present

## 2018-10-20 DIAGNOSIS — Z992 Dependence on renal dialysis: Secondary | ICD-10-CM | POA: Diagnosis not present

## 2018-10-20 DIAGNOSIS — N2581 Secondary hyperparathyroidism of renal origin: Secondary | ICD-10-CM | POA: Diagnosis not present

## 2018-10-20 DIAGNOSIS — D631 Anemia in chronic kidney disease: Secondary | ICD-10-CM | POA: Diagnosis not present

## 2018-10-20 DIAGNOSIS — Z20828 Contact with and (suspected) exposure to other viral communicable diseases: Secondary | ICD-10-CM | POA: Diagnosis not present

## 2018-10-20 DIAGNOSIS — Z01812 Encounter for preprocedural laboratory examination: Secondary | ICD-10-CM | POA: Diagnosis not present

## 2018-10-21 DIAGNOSIS — N2581 Secondary hyperparathyroidism of renal origin: Secondary | ICD-10-CM | POA: Diagnosis not present

## 2018-10-21 DIAGNOSIS — D631 Anemia in chronic kidney disease: Secondary | ICD-10-CM | POA: Diagnosis not present

## 2018-10-21 DIAGNOSIS — N186 End stage renal disease: Secondary | ICD-10-CM | POA: Diagnosis not present

## 2018-10-21 DIAGNOSIS — Z992 Dependence on renal dialysis: Secondary | ICD-10-CM | POA: Diagnosis not present

## 2018-10-21 DIAGNOSIS — D509 Iron deficiency anemia, unspecified: Secondary | ICD-10-CM | POA: Diagnosis not present

## 2018-10-22 DIAGNOSIS — D631 Anemia in chronic kidney disease: Secondary | ICD-10-CM | POA: Diagnosis not present

## 2018-10-22 DIAGNOSIS — N186 End stage renal disease: Secondary | ICD-10-CM | POA: Diagnosis not present

## 2018-10-22 DIAGNOSIS — D509 Iron deficiency anemia, unspecified: Secondary | ICD-10-CM | POA: Diagnosis not present

## 2018-10-22 DIAGNOSIS — N2581 Secondary hyperparathyroidism of renal origin: Secondary | ICD-10-CM | POA: Diagnosis not present

## 2018-10-22 DIAGNOSIS — Z992 Dependence on renal dialysis: Secondary | ICD-10-CM | POA: Diagnosis not present

## 2018-10-23 DIAGNOSIS — D509 Iron deficiency anemia, unspecified: Secondary | ICD-10-CM | POA: Diagnosis not present

## 2018-10-23 DIAGNOSIS — N186 End stage renal disease: Secondary | ICD-10-CM | POA: Diagnosis not present

## 2018-10-23 DIAGNOSIS — Z992 Dependence on renal dialysis: Secondary | ICD-10-CM | POA: Diagnosis not present

## 2018-10-23 DIAGNOSIS — N2581 Secondary hyperparathyroidism of renal origin: Secondary | ICD-10-CM | POA: Diagnosis not present

## 2018-10-23 DIAGNOSIS — R112 Nausea with vomiting, unspecified: Secondary | ICD-10-CM | POA: Diagnosis not present

## 2018-10-23 DIAGNOSIS — K3189 Other diseases of stomach and duodenum: Secondary | ICD-10-CM | POA: Diagnosis not present

## 2018-10-23 DIAGNOSIS — K3184 Gastroparesis: Secondary | ICD-10-CM | POA: Diagnosis not present

## 2018-10-23 DIAGNOSIS — D631 Anemia in chronic kidney disease: Secondary | ICD-10-CM | POA: Diagnosis not present

## 2018-10-24 DIAGNOSIS — D631 Anemia in chronic kidney disease: Secondary | ICD-10-CM | POA: Diagnosis not present

## 2018-10-24 DIAGNOSIS — N2581 Secondary hyperparathyroidism of renal origin: Secondary | ICD-10-CM | POA: Diagnosis not present

## 2018-10-24 DIAGNOSIS — N186 End stage renal disease: Secondary | ICD-10-CM | POA: Diagnosis not present

## 2018-10-24 DIAGNOSIS — D509 Iron deficiency anemia, unspecified: Secondary | ICD-10-CM | POA: Diagnosis not present

## 2018-10-24 DIAGNOSIS — Z992 Dependence on renal dialysis: Secondary | ICD-10-CM | POA: Diagnosis not present

## 2018-10-25 DIAGNOSIS — D509 Iron deficiency anemia, unspecified: Secondary | ICD-10-CM | POA: Diagnosis not present

## 2018-10-25 DIAGNOSIS — N186 End stage renal disease: Secondary | ICD-10-CM | POA: Diagnosis not present

## 2018-10-25 DIAGNOSIS — N2581 Secondary hyperparathyroidism of renal origin: Secondary | ICD-10-CM | POA: Diagnosis not present

## 2018-10-25 DIAGNOSIS — Z992 Dependence on renal dialysis: Secondary | ICD-10-CM | POA: Diagnosis not present

## 2018-10-25 DIAGNOSIS — D631 Anemia in chronic kidney disease: Secondary | ICD-10-CM | POA: Diagnosis not present

## 2018-10-26 ENCOUNTER — Encounter: Payer: Medicare Other | Attending: Physical Medicine & Rehabilitation | Admitting: Physical Medicine & Rehabilitation

## 2018-10-26 DIAGNOSIS — E1142 Type 2 diabetes mellitus with diabetic polyneuropathy: Secondary | ICD-10-CM | POA: Insufficient documentation

## 2018-10-26 DIAGNOSIS — Z992 Dependence on renal dialysis: Secondary | ICD-10-CM | POA: Diagnosis not present

## 2018-10-26 DIAGNOSIS — G479 Sleep disorder, unspecified: Secondary | ICD-10-CM | POA: Insufficient documentation

## 2018-10-26 DIAGNOSIS — D509 Iron deficiency anemia, unspecified: Secondary | ICD-10-CM | POA: Diagnosis not present

## 2018-10-26 DIAGNOSIS — D631 Anemia in chronic kidney disease: Secondary | ICD-10-CM | POA: Diagnosis not present

## 2018-10-26 DIAGNOSIS — N2581 Secondary hyperparathyroidism of renal origin: Secondary | ICD-10-CM | POA: Diagnosis not present

## 2018-10-26 DIAGNOSIS — N186 End stage renal disease: Secondary | ICD-10-CM | POA: Diagnosis not present

## 2018-10-26 DIAGNOSIS — R269 Unspecified abnormalities of gait and mobility: Secondary | ICD-10-CM | POA: Insufficient documentation

## 2018-10-27 DIAGNOSIS — R140 Abdominal distension (gaseous): Secondary | ICD-10-CM | POA: Diagnosis not present

## 2018-10-27 DIAGNOSIS — D631 Anemia in chronic kidney disease: Secondary | ICD-10-CM | POA: Diagnosis not present

## 2018-10-27 DIAGNOSIS — Z992 Dependence on renal dialysis: Secondary | ICD-10-CM | POA: Diagnosis not present

## 2018-10-27 DIAGNOSIS — N186 End stage renal disease: Secondary | ICD-10-CM | POA: Diagnosis not present

## 2018-10-27 DIAGNOSIS — D509 Iron deficiency anemia, unspecified: Secondary | ICD-10-CM | POA: Diagnosis not present

## 2018-10-27 DIAGNOSIS — K5900 Constipation, unspecified: Secondary | ICD-10-CM | POA: Diagnosis not present

## 2018-10-27 DIAGNOSIS — R1030 Lower abdominal pain, unspecified: Secondary | ICD-10-CM | POA: Diagnosis not present

## 2018-10-27 DIAGNOSIS — N2581 Secondary hyperparathyroidism of renal origin: Secondary | ICD-10-CM | POA: Diagnosis not present

## 2018-10-27 DIAGNOSIS — R112 Nausea with vomiting, unspecified: Secondary | ICD-10-CM | POA: Diagnosis not present

## 2018-10-28 DIAGNOSIS — N186 End stage renal disease: Secondary | ICD-10-CM | POA: Diagnosis not present

## 2018-10-28 DIAGNOSIS — D631 Anemia in chronic kidney disease: Secondary | ICD-10-CM | POA: Diagnosis not present

## 2018-10-28 DIAGNOSIS — N2581 Secondary hyperparathyroidism of renal origin: Secondary | ICD-10-CM | POA: Diagnosis not present

## 2018-10-28 DIAGNOSIS — Z992 Dependence on renal dialysis: Secondary | ICD-10-CM | POA: Diagnosis not present

## 2018-10-28 DIAGNOSIS — D509 Iron deficiency anemia, unspecified: Secondary | ICD-10-CM | POA: Diagnosis not present

## 2018-10-29 DIAGNOSIS — N2581 Secondary hyperparathyroidism of renal origin: Secondary | ICD-10-CM | POA: Diagnosis not present

## 2018-10-29 DIAGNOSIS — Z992 Dependence on renal dialysis: Secondary | ICD-10-CM | POA: Diagnosis not present

## 2018-10-29 DIAGNOSIS — N186 End stage renal disease: Secondary | ICD-10-CM | POA: Diagnosis not present

## 2018-10-29 DIAGNOSIS — D631 Anemia in chronic kidney disease: Secondary | ICD-10-CM | POA: Diagnosis not present

## 2018-10-29 DIAGNOSIS — D509 Iron deficiency anemia, unspecified: Secondary | ICD-10-CM | POA: Diagnosis not present

## 2018-10-30 DIAGNOSIS — N2581 Secondary hyperparathyroidism of renal origin: Secondary | ICD-10-CM | POA: Diagnosis not present

## 2018-10-30 DIAGNOSIS — N186 End stage renal disease: Secondary | ICD-10-CM | POA: Diagnosis not present

## 2018-10-30 DIAGNOSIS — D509 Iron deficiency anemia, unspecified: Secondary | ICD-10-CM | POA: Diagnosis not present

## 2018-10-30 DIAGNOSIS — Z992 Dependence on renal dialysis: Secondary | ICD-10-CM | POA: Diagnosis not present

## 2018-10-30 DIAGNOSIS — D631 Anemia in chronic kidney disease: Secondary | ICD-10-CM | POA: Diagnosis not present

## 2018-10-31 DIAGNOSIS — N186 End stage renal disease: Secondary | ICD-10-CM | POA: Diagnosis not present

## 2018-10-31 DIAGNOSIS — Z992 Dependence on renal dialysis: Secondary | ICD-10-CM | POA: Diagnosis not present

## 2018-10-31 DIAGNOSIS — D631 Anemia in chronic kidney disease: Secondary | ICD-10-CM | POA: Diagnosis not present

## 2018-10-31 DIAGNOSIS — N2581 Secondary hyperparathyroidism of renal origin: Secondary | ICD-10-CM | POA: Diagnosis not present

## 2018-10-31 DIAGNOSIS — D509 Iron deficiency anemia, unspecified: Secondary | ICD-10-CM | POA: Diagnosis not present

## 2018-11-01 DIAGNOSIS — N186 End stage renal disease: Secondary | ICD-10-CM | POA: Diagnosis not present

## 2018-11-01 DIAGNOSIS — D631 Anemia in chronic kidney disease: Secondary | ICD-10-CM | POA: Diagnosis not present

## 2018-11-01 DIAGNOSIS — D509 Iron deficiency anemia, unspecified: Secondary | ICD-10-CM | POA: Diagnosis not present

## 2018-11-01 DIAGNOSIS — N2581 Secondary hyperparathyroidism of renal origin: Secondary | ICD-10-CM | POA: Diagnosis not present

## 2018-11-01 DIAGNOSIS — Z992 Dependence on renal dialysis: Secondary | ICD-10-CM | POA: Diagnosis not present

## 2018-11-02 DIAGNOSIS — N2581 Secondary hyperparathyroidism of renal origin: Secondary | ICD-10-CM | POA: Diagnosis not present

## 2018-11-02 DIAGNOSIS — Z992 Dependence on renal dialysis: Secondary | ICD-10-CM | POA: Diagnosis not present

## 2018-11-02 DIAGNOSIS — D509 Iron deficiency anemia, unspecified: Secondary | ICD-10-CM | POA: Diagnosis not present

## 2018-11-02 DIAGNOSIS — D631 Anemia in chronic kidney disease: Secondary | ICD-10-CM | POA: Diagnosis not present

## 2018-11-02 DIAGNOSIS — N186 End stage renal disease: Secondary | ICD-10-CM | POA: Diagnosis not present

## 2018-11-03 DIAGNOSIS — D631 Anemia in chronic kidney disease: Secondary | ICD-10-CM | POA: Diagnosis not present

## 2018-11-03 DIAGNOSIS — N186 End stage renal disease: Secondary | ICD-10-CM | POA: Diagnosis not present

## 2018-11-03 DIAGNOSIS — Z992 Dependence on renal dialysis: Secondary | ICD-10-CM | POA: Diagnosis not present

## 2018-11-03 DIAGNOSIS — D509 Iron deficiency anemia, unspecified: Secondary | ICD-10-CM | POA: Diagnosis not present

## 2018-11-03 DIAGNOSIS — N2581 Secondary hyperparathyroidism of renal origin: Secondary | ICD-10-CM | POA: Diagnosis not present

## 2018-11-04 DIAGNOSIS — N186 End stage renal disease: Secondary | ICD-10-CM | POA: Diagnosis not present

## 2018-11-04 DIAGNOSIS — N2581 Secondary hyperparathyroidism of renal origin: Secondary | ICD-10-CM | POA: Diagnosis not present

## 2018-11-04 DIAGNOSIS — D509 Iron deficiency anemia, unspecified: Secondary | ICD-10-CM | POA: Diagnosis not present

## 2018-11-04 DIAGNOSIS — Z992 Dependence on renal dialysis: Secondary | ICD-10-CM | POA: Diagnosis not present

## 2018-11-04 DIAGNOSIS — D631 Anemia in chronic kidney disease: Secondary | ICD-10-CM | POA: Diagnosis not present

## 2018-11-05 DIAGNOSIS — N186 End stage renal disease: Secondary | ICD-10-CM | POA: Diagnosis not present

## 2018-11-05 DIAGNOSIS — N2581 Secondary hyperparathyroidism of renal origin: Secondary | ICD-10-CM | POA: Diagnosis not present

## 2018-11-05 DIAGNOSIS — D509 Iron deficiency anemia, unspecified: Secondary | ICD-10-CM | POA: Diagnosis not present

## 2018-11-05 DIAGNOSIS — D631 Anemia in chronic kidney disease: Secondary | ICD-10-CM | POA: Diagnosis not present

## 2018-11-05 DIAGNOSIS — Z992 Dependence on renal dialysis: Secondary | ICD-10-CM | POA: Diagnosis not present

## 2018-11-06 DIAGNOSIS — D631 Anemia in chronic kidney disease: Secondary | ICD-10-CM | POA: Diagnosis not present

## 2018-11-06 DIAGNOSIS — Z992 Dependence on renal dialysis: Secondary | ICD-10-CM | POA: Diagnosis not present

## 2018-11-06 DIAGNOSIS — N2581 Secondary hyperparathyroidism of renal origin: Secondary | ICD-10-CM | POA: Diagnosis not present

## 2018-11-06 DIAGNOSIS — D509 Iron deficiency anemia, unspecified: Secondary | ICD-10-CM | POA: Diagnosis not present

## 2018-11-06 DIAGNOSIS — N186 End stage renal disease: Secondary | ICD-10-CM | POA: Diagnosis not present

## 2018-11-07 DIAGNOSIS — D631 Anemia in chronic kidney disease: Secondary | ICD-10-CM | POA: Diagnosis not present

## 2018-11-07 DIAGNOSIS — N2581 Secondary hyperparathyroidism of renal origin: Secondary | ICD-10-CM | POA: Diagnosis not present

## 2018-11-07 DIAGNOSIS — Z992 Dependence on renal dialysis: Secondary | ICD-10-CM | POA: Diagnosis not present

## 2018-11-07 DIAGNOSIS — D509 Iron deficiency anemia, unspecified: Secondary | ICD-10-CM | POA: Diagnosis not present

## 2018-11-07 DIAGNOSIS — N186 End stage renal disease: Secondary | ICD-10-CM | POA: Diagnosis not present

## 2018-11-08 DIAGNOSIS — D631 Anemia in chronic kidney disease: Secondary | ICD-10-CM | POA: Diagnosis not present

## 2018-11-08 DIAGNOSIS — D509 Iron deficiency anemia, unspecified: Secondary | ICD-10-CM | POA: Diagnosis not present

## 2018-11-08 DIAGNOSIS — Z992 Dependence on renal dialysis: Secondary | ICD-10-CM | POA: Diagnosis not present

## 2018-11-08 DIAGNOSIS — N186 End stage renal disease: Secondary | ICD-10-CM | POA: Diagnosis not present

## 2018-11-08 DIAGNOSIS — N2581 Secondary hyperparathyroidism of renal origin: Secondary | ICD-10-CM | POA: Diagnosis not present

## 2018-11-09 DIAGNOSIS — N186 End stage renal disease: Secondary | ICD-10-CM | POA: Diagnosis not present

## 2018-11-09 DIAGNOSIS — R112 Nausea with vomiting, unspecified: Secondary | ICD-10-CM | POA: Diagnosis not present

## 2018-11-09 DIAGNOSIS — Z992 Dependence on renal dialysis: Secondary | ICD-10-CM | POA: Diagnosis not present

## 2018-11-09 DIAGNOSIS — D509 Iron deficiency anemia, unspecified: Secondary | ICD-10-CM | POA: Diagnosis not present

## 2018-11-09 DIAGNOSIS — D631 Anemia in chronic kidney disease: Secondary | ICD-10-CM | POA: Diagnosis not present

## 2018-11-09 DIAGNOSIS — N2581 Secondary hyperparathyroidism of renal origin: Secondary | ICD-10-CM | POA: Diagnosis not present

## 2018-11-09 DIAGNOSIS — K3189 Other diseases of stomach and duodenum: Secondary | ICD-10-CM | POA: Diagnosis not present

## 2018-11-09 DIAGNOSIS — K30 Functional dyspepsia: Secondary | ICD-10-CM | POA: Diagnosis not present

## 2018-11-10 DIAGNOSIS — I6529 Occlusion and stenosis of unspecified carotid artery: Secondary | ICD-10-CM | POA: Insufficient documentation

## 2018-11-10 DIAGNOSIS — N2581 Secondary hyperparathyroidism of renal origin: Secondary | ICD-10-CM | POA: Diagnosis not present

## 2018-11-10 DIAGNOSIS — E785 Hyperlipidemia, unspecified: Secondary | ICD-10-CM | POA: Insufficient documentation

## 2018-11-10 DIAGNOSIS — D509 Iron deficiency anemia, unspecified: Secondary | ICD-10-CM | POA: Diagnosis not present

## 2018-11-10 DIAGNOSIS — Z992 Dependence on renal dialysis: Secondary | ICD-10-CM | POA: Diagnosis not present

## 2018-11-10 DIAGNOSIS — N186 End stage renal disease: Secondary | ICD-10-CM | POA: Diagnosis not present

## 2018-11-10 DIAGNOSIS — Z8679 Personal history of other diseases of the circulatory system: Secondary | ICD-10-CM | POA: Insufficient documentation

## 2018-11-10 DIAGNOSIS — D631 Anemia in chronic kidney disease: Secondary | ICD-10-CM | POA: Diagnosis not present

## 2018-11-11 DIAGNOSIS — D631 Anemia in chronic kidney disease: Secondary | ICD-10-CM | POA: Diagnosis not present

## 2018-11-11 DIAGNOSIS — N2581 Secondary hyperparathyroidism of renal origin: Secondary | ICD-10-CM | POA: Diagnosis not present

## 2018-11-11 DIAGNOSIS — D509 Iron deficiency anemia, unspecified: Secondary | ICD-10-CM | POA: Diagnosis not present

## 2018-11-11 DIAGNOSIS — N186 End stage renal disease: Secondary | ICD-10-CM | POA: Diagnosis not present

## 2018-11-11 DIAGNOSIS — Z992 Dependence on renal dialysis: Secondary | ICD-10-CM | POA: Diagnosis not present

## 2018-11-12 DIAGNOSIS — Z992 Dependence on renal dialysis: Secondary | ICD-10-CM | POA: Diagnosis not present

## 2018-11-12 DIAGNOSIS — D631 Anemia in chronic kidney disease: Secondary | ICD-10-CM | POA: Diagnosis not present

## 2018-11-12 DIAGNOSIS — D509 Iron deficiency anemia, unspecified: Secondary | ICD-10-CM | POA: Diagnosis not present

## 2018-11-12 DIAGNOSIS — E1122 Type 2 diabetes mellitus with diabetic chronic kidney disease: Secondary | ICD-10-CM | POA: Diagnosis not present

## 2018-11-12 DIAGNOSIS — N2581 Secondary hyperparathyroidism of renal origin: Secondary | ICD-10-CM | POA: Diagnosis not present

## 2018-11-12 DIAGNOSIS — N186 End stage renal disease: Secondary | ICD-10-CM | POA: Diagnosis not present

## 2018-11-12 DIAGNOSIS — Z4932 Encounter for adequacy testing for peritoneal dialysis: Secondary | ICD-10-CM | POA: Diagnosis not present

## 2018-11-13 DIAGNOSIS — D631 Anemia in chronic kidney disease: Secondary | ICD-10-CM | POA: Diagnosis not present

## 2018-11-13 DIAGNOSIS — N186 End stage renal disease: Secondary | ICD-10-CM | POA: Diagnosis not present

## 2018-11-13 DIAGNOSIS — Z992 Dependence on renal dialysis: Secondary | ICD-10-CM | POA: Diagnosis not present

## 2018-11-13 DIAGNOSIS — Z4932 Encounter for adequacy testing for peritoneal dialysis: Secondary | ICD-10-CM | POA: Diagnosis not present

## 2018-11-13 DIAGNOSIS — D509 Iron deficiency anemia, unspecified: Secondary | ICD-10-CM | POA: Diagnosis not present

## 2018-11-13 DIAGNOSIS — N2581 Secondary hyperparathyroidism of renal origin: Secondary | ICD-10-CM | POA: Diagnosis not present

## 2018-11-14 DIAGNOSIS — D509 Iron deficiency anemia, unspecified: Secondary | ICD-10-CM | POA: Diagnosis not present

## 2018-11-14 DIAGNOSIS — N2581 Secondary hyperparathyroidism of renal origin: Secondary | ICD-10-CM | POA: Diagnosis not present

## 2018-11-14 DIAGNOSIS — E7849 Other hyperlipidemia: Secondary | ICD-10-CM | POA: Diagnosis not present

## 2018-11-14 DIAGNOSIS — R82998 Other abnormal findings in urine: Secondary | ICD-10-CM | POA: Diagnosis not present

## 2018-11-14 DIAGNOSIS — N186 End stage renal disease: Secondary | ICD-10-CM | POA: Diagnosis not present

## 2018-11-14 DIAGNOSIS — Z992 Dependence on renal dialysis: Secondary | ICD-10-CM | POA: Diagnosis not present

## 2018-11-14 DIAGNOSIS — Z4932 Encounter for adequacy testing for peritoneal dialysis: Secondary | ICD-10-CM | POA: Diagnosis not present

## 2018-11-14 DIAGNOSIS — D631 Anemia in chronic kidney disease: Secondary | ICD-10-CM | POA: Diagnosis not present

## 2018-11-15 ENCOUNTER — Encounter (INDEPENDENT_AMBULATORY_CARE_PROVIDER_SITE_OTHER): Payer: Medicare Other | Admitting: Ophthalmology

## 2018-11-15 DIAGNOSIS — H35033 Hypertensive retinopathy, bilateral: Secondary | ICD-10-CM | POA: Diagnosis not present

## 2018-11-15 DIAGNOSIS — I10 Essential (primary) hypertension: Secondary | ICD-10-CM | POA: Diagnosis not present

## 2018-11-15 DIAGNOSIS — D631 Anemia in chronic kidney disease: Secondary | ICD-10-CM | POA: Diagnosis not present

## 2018-11-15 DIAGNOSIS — H43813 Vitreous degeneration, bilateral: Secondary | ICD-10-CM | POA: Diagnosis not present

## 2018-11-15 DIAGNOSIS — E11311 Type 2 diabetes mellitus with unspecified diabetic retinopathy with macular edema: Secondary | ICD-10-CM

## 2018-11-15 DIAGNOSIS — N2581 Secondary hyperparathyroidism of renal origin: Secondary | ICD-10-CM | POA: Diagnosis not present

## 2018-11-15 DIAGNOSIS — Z992 Dependence on renal dialysis: Secondary | ICD-10-CM | POA: Diagnosis not present

## 2018-11-15 DIAGNOSIS — E113413 Type 2 diabetes mellitus with severe nonproliferative diabetic retinopathy with macular edema, bilateral: Secondary | ICD-10-CM | POA: Diagnosis not present

## 2018-11-15 DIAGNOSIS — D509 Iron deficiency anemia, unspecified: Secondary | ICD-10-CM | POA: Diagnosis not present

## 2018-11-15 DIAGNOSIS — N186 End stage renal disease: Secondary | ICD-10-CM | POA: Diagnosis not present

## 2018-11-15 DIAGNOSIS — Z4932 Encounter for adequacy testing for peritoneal dialysis: Secondary | ICD-10-CM | POA: Diagnosis not present

## 2018-11-16 DIAGNOSIS — D631 Anemia in chronic kidney disease: Secondary | ICD-10-CM | POA: Diagnosis not present

## 2018-11-16 DIAGNOSIS — N2581 Secondary hyperparathyroidism of renal origin: Secondary | ICD-10-CM | POA: Diagnosis not present

## 2018-11-16 DIAGNOSIS — Z992 Dependence on renal dialysis: Secondary | ICD-10-CM | POA: Diagnosis not present

## 2018-11-16 DIAGNOSIS — D509 Iron deficiency anemia, unspecified: Secondary | ICD-10-CM | POA: Diagnosis not present

## 2018-11-16 DIAGNOSIS — N186 End stage renal disease: Secondary | ICD-10-CM | POA: Diagnosis not present

## 2018-11-16 DIAGNOSIS — Z4932 Encounter for adequacy testing for peritoneal dialysis: Secondary | ICD-10-CM | POA: Diagnosis not present

## 2018-11-17 ENCOUNTER — Encounter (HOSPITAL_COMMUNITY): Payer: Self-pay

## 2018-11-17 ENCOUNTER — Other Ambulatory Visit: Payer: Self-pay

## 2018-11-17 ENCOUNTER — Ambulatory Visit (HOSPITAL_COMMUNITY)
Admission: EM | Admit: 2018-11-17 | Discharge: 2018-11-17 | Disposition: A | Discharge status: Home / Self Care | Payer: Medicare Other | Attending: Family Medicine | Admitting: Family Medicine

## 2018-11-17 ENCOUNTER — Ambulatory Visit (INDEPENDENT_AMBULATORY_CARE_PROVIDER_SITE_OTHER): Payer: Medicare Other

## 2018-11-17 DIAGNOSIS — S80211A Abrasion, right knee, initial encounter: Secondary | ICD-10-CM

## 2018-11-17 DIAGNOSIS — Z4932 Encounter for adequacy testing for peritoneal dialysis: Secondary | ICD-10-CM | POA: Diagnosis not present

## 2018-11-17 DIAGNOSIS — W19XXXA Unspecified fall, initial encounter: Secondary | ICD-10-CM

## 2018-11-17 DIAGNOSIS — M25561 Pain in right knee: Secondary | ICD-10-CM

## 2018-11-17 DIAGNOSIS — R2231 Localized swelling, mass and lump, right upper limb: Secondary | ICD-10-CM

## 2018-11-17 DIAGNOSIS — Z992 Dependence on renal dialysis: Secondary | ICD-10-CM | POA: Diagnosis not present

## 2018-11-17 DIAGNOSIS — S80212A Abrasion, left knee, initial encounter: Secondary | ICD-10-CM | POA: Diagnosis not present

## 2018-11-17 DIAGNOSIS — M25562 Pain in left knee: Secondary | ICD-10-CM | POA: Diagnosis not present

## 2018-11-17 DIAGNOSIS — D631 Anemia in chronic kidney disease: Secondary | ICD-10-CM | POA: Diagnosis not present

## 2018-11-17 DIAGNOSIS — Y92009 Unspecified place in unspecified non-institutional (private) residence as the place of occurrence of the external cause: Secondary | ICD-10-CM | POA: Diagnosis not present

## 2018-11-17 DIAGNOSIS — I10 Essential (primary) hypertension: Secondary | ICD-10-CM

## 2018-11-17 DIAGNOSIS — M7989 Other specified soft tissue disorders: Secondary | ICD-10-CM | POA: Diagnosis not present

## 2018-11-17 DIAGNOSIS — N186 End stage renal disease: Secondary | ICD-10-CM | POA: Diagnosis not present

## 2018-11-17 DIAGNOSIS — N2581 Secondary hyperparathyroidism of renal origin: Secondary | ICD-10-CM | POA: Diagnosis not present

## 2018-11-17 DIAGNOSIS — Z880 Allergy status to penicillin: Secondary | ICD-10-CM | POA: Diagnosis not present

## 2018-11-17 DIAGNOSIS — D509 Iron deficiency anemia, unspecified: Secondary | ICD-10-CM | POA: Diagnosis not present

## 2018-11-17 MED ORDER — TRAMADOL HCL 50 MG PO TABS: 50.0000 mg | ORAL_TABLET | Freq: Two times a day (BID) | ORAL | 0 refills | Status: DC | PRN

## 2018-11-17 MED ORDER — MUPIROCIN CALCIUM 2 % EX CREA: 1.0000 "application " | TOPICAL_CREAM | Freq: Two times a day (BID) | CUTANEOUS | 0 refills | Status: DC | PRN

## 2018-11-17 NOTE — ED Provider Notes (Signed)
Susan Horton    CSN: [id number] Arrival date & time: 11/17/18  1152      History   Chief Complaint Chief Complaint  Patient presents with  . Knee Pain  . Hand Problem    HPI Susan Horton is a 48 y.o. female.   48 year old female with multiple chronic health issues presents with injury to both knees. She was at home and walking back inside her house when she slipped on an acorn or similar item on the ground, fell and landed on both knees. She felt immediate pain and scraped up both knees. She has been putting OTC antibiotic ointment on both knees and left knee has continued to get more swollen, painful and warm. Right knee is still painful but slowly improving. Having difficulty walking due to pain- has limited movement, especially of left knee. No previous injury to her knees. Has applied an ice pack with minimal relief.  Also concerned over a "lump" in her right hand (mid-palm) that appeared about 2 weeks ago. Was painful at first but minimal pain now but not resolving. Has history of poorly controlled diabetes with chronic kidney disease and currently on Dialysis. Also has CAD, hyperlipidemia,  peripheral neuropathy, GERD and asthma. Currently on Insulin, Plavix, aspirin, Toprol XL, Mevacor, Elavil, Lyrica, Advair, Singulair, Protonix, Reglan daily and Albuterol and Senokot prn. Unable to take NSAIDs due to kidney disease.   The history is provided by the patient.    Past Medical History:  Diagnosis Date  . Anemia   . Angio-edema   . Asthma   . CAD (coronary artery disease)    DES to mid LAD July 2018, residual moderate RCA disease  . Cataract   . CKD (chronic kidney disease) stage 4, GFR 15-29 ml/min (HCC)    Dialysis T/Th/Sa  . DVT (deep venous thrombosis) ([place])    1996 during pregnancy, 2015 left leg  . Eczema   . Essential hypertension 12/11/2015  . Gastroparesis   . GERD (gastroesophageal reflux disease)   . Hidradenitis   . Migraine   . Neuropathy   .  Peripheral vascular disease ([place])    blood clot in leg  . Type 2 diabetes mellitus ([place])   . Type II diabetes mellitus ([place])   . Urticaria     Patient Active Problem List   Diagnosis Date Noted  . Symptomatic anemia 09/16/2018  . Peripheral vascular disease, unspecified ([place]) 09/04/2018  . ESRD on dialysis ([place]) 10/11/2017  . Chronic diastolic CHF (congestive heart failure) ([place]) 06/18/2017  . Elevated troponin 06/18/2017  . Moderate persistent asthma 06/10/2017  . Adjustment disorder with depressed mood 09/08/2016  . Chest pain 08/29/2016  . CKD (chronic kidney disease) stage 4, GFR 15-29 ml/min (HCC)   . CAD S/P mLAD PCI with DES 08/06/2016  . Presence of drug coated stent in LAD coronary artery 08/06/2016  . Abnormal stress test   . Carotid bruit present 06/15/2016  . Dyslipidemia 06/15/2016  . Smoker 06/15/2016  . Neuropathy 03/10/2016  . Essential hypertension, benign 12/11/2015  . Gastroparesis   . Progressive angina ([place]) 06/05/2014  . Uncontrolled type 2 diabetes mellitus with peripheral neuropathy ([place]) 06/05/2014    Past Surgical History:  Procedure Laterality Date  . A/V FISTULAGRAM N/A 06/22/2017   Procedure: A/V FISTULAGRAM - left arm;  Surgeon: [name], MD;  Location: [facility];  Service: Cardiovascular;  Laterality: N/A;  . ABDOMINOPLASTY    . ADENOIDECTOMY    .  APPENDECTOMY  1995  . AV FISTULA PLACEMENT Left 02/01/2017   Procedure: ARTERIOVENOUS BRACHIOCEPHALIC (AV) FISTULA CREATION;  Surgeon: [name], MD;  Location: [facility];  Service: Vascular;  Laterality: Left;  . CHOLECYSTECTOMY  1993  . CORONARY ANGIOPLASTY WITH STENT PLACEMENT    . CORONARY STENT INTERVENTION N/A 08/05/2016   Procedure: Coronary Stent Intervention;  Surgeon: [name], MD;  Location: [facility];  Service: Cardiovascular;  Laterality: N/A;  . EXPLORATORY LAPAROTOMY  08/14/2005   lysis of adhesions, drainage of tubo-ovarian abscess  .  FISTULA SUPERFICIALIZATION Left 09/14/2017   Procedure: FISTULA SUPERFICIALIZATION ARTERIOVENOUS FISTULA LEFT ARM;  Surgeon: [name], MD;  Location: [facility];  Service: Vascular;  Laterality: Left;  . HYDRADENITIS EXCISION  02/08/2011   Procedure: EXCISION HYDRADENITIS GROIN;  Surgeon: [name], MD;  Location: [place] [facility];  Service: General;  Laterality: N/A;  Excisioin of Hidradenitis Left groin  . INGUINAL HIDRADENITIS EXCISION  07/06/2010   bilateral  . INSERTION OF DIALYSIS CATHETER N/A 09/14/2017   Procedure: INSERTION OF TUNNELED DIALYSIS CATHETER Right Internal Jugular;  Surgeon: [name], MD;  Location: [facility];  Service: Vascular;  Laterality: N/A;  . LEFT HEART CATH AND CORONARY ANGIOGRAPHY N/A 08/05/2016   Procedure: Left Heart Cath and Coronary Angiography;  Surgeon: [name], MD;  Location: [facility];  Service: Cardiovascular;  Laterality: N/A;  . REDUCTION MAMMAPLASTY  2002  . TONSILLECTOMY    . TUBAL LIGATION  1996  . VAGINAL HYSTERECTOMY  08/04/2005   and cysto    OB History   No obstetric history on file.      Home Medications    Prior to Admission medications   Medication Sig Start Date End Date Taking? Authorizing Provider  albuterol (PROVENTIL HFA;VENTOLIN HFA) 108 (90 BASE) MCG/ACT inhaler Inhale 1-2 puffs into the lungs every 6 (six) hours as needed for wheezing or shortness of breath.     [provider]  amitriptyline (ELAVIL) 10 MG tablet Take 1 tablet (10 mg total) by mouth at bedtime. 09/19/18   Jamse Arn, MD  aspirin 81 MG chewable tablet Chew 1 tablet (81 mg total) by mouth daily. 08/07/16   Cheryln Manly, NP  calcitRIOL (ROCALTROL) 0.5 MCG capsule Take 1.5 mcg by mouth daily. 09/11/18   [provider]  calcium acetate (PHOSLO) 667 MG capsule Take 1,334-2,001 mg by mouth See admin instructions. Take three capsules (2001 mg) by mouth up to three times daily with meals and two  capsules (1334 mg) with snacks 09/23/17   [provider]  clopidogrel (PLAVIX) 75 MG tablet Take 1 tablet (75 mg total) by mouth daily with breakfast. 10/19/17   Kilroy, Doreene Burke, PA-C  Ferrous Sulfate (IRON) 325 (65 Fe) MG TABS Take 325 mg by mouth daily. 11/25/15   Brayton Caves, PA-C  Fluticasone-Salmeterol (ADVAIR) 250-50 MCG/DOSE AEPB Inhale 1 puff into the lungs 2 (two) times daily. Patient taking differently: Inhale 1 puff into the lungs daily as needed (shortness of breath).  06/10/17   Charlott Rakes, MD  gentamicin cream (GARAMYCIN) 0.1 % Apply 1 application topically See admin instructions. Apply to access site nightly 09/11/18   [provider]  Insulin Glargine, 1 Unit Dial, (TOUJEO SOLOSTAR) 300 UNIT/ML SOPN Inject 25 Units into the skin 2 (two) times a day. Patient taking differently: Inject 50 Units into the skin 2 (two) times a day.  05/22/18   Philemon Kingdom, MD  insulin regular (HUMULIN R) 100 units/mL injection Inject 0.2-0.25 mLs (20-25 Units total) into the skin See admin instructions. Patient taking differently: Inject 7-15 Units into the skin 3 (three) times daily before meals. Per CBG 09/22/17   Philemon Kingdom, MD  isosorbide mononitrate (IMDUR) 30 MG 24 hr tablet Take 0.5 tablets (15 mg total) by mouth daily. 10/19/17 11/10/18  Erlene Quan, PA-C  lidocaine-prilocaine (EMLA) cream Apply 1 application topically See admin instructions. Apply small amount to access site (AVF) 30 minutes before hemodialysis, cover with occlusive dressing (Saran Wrap) if going out to dialysis center 09/15/17   [provider]  lovastatin (MEVACOR) 20 MG tablet Take 20 mg by mouth at bedtime.     [provider]  methocarbamol (ROBAXIN) 500 MG tablet Take 1 tablet (500 mg total) by mouth 2 (two) times daily as needed for muscle spasms. 09/07/17   Jamse Arn, MD  metoCLOPramide (REGLAN) 5 MG tablet TAKE 1 TABLET BY MOUTH 4 TIMES DAILY BEFORE MEALS AND AT  BEDTIME. Patient taking differently: Take 5 mg by mouth 4 (four) times daily -  before meals and at bedtime.  03/18/17   [name], MD  metoprolol succinate (TOPROL-XL) 50 MG 24 hr tablet Take 1 tablet (50 mg total) by mouth daily. 03/14/18   [name], MD  montelukast (SINGULAIR) 10 MG tablet Take 1 tablet (10 mg total) by mouth at bedtime. Patient taking differently: Take 10 mg by mouth at bedtime as needed (allergies).  04/12/18   Kennith Gain, MD  multivitamin (RENA-VIT) TABS tablet Take 1 tablet by mouth at bedtime. 06/23/17   Mikhail, Velta Addison, DO  mupirocin cream (BACTROBAN) 2 % Apply 1 application topically 2 (two) times daily as needed. To affected areas and cover with non-stick gauze. 11/17/18   Katy Apo, NP  nitroGLYCERIN (NITROSTAT) 0.4 MG SL tablet Place 1 tablet (0.4 mg total) under the tongue every 5 (five) minutes as needed for chest pain. 10/19/17   Erlene Quan, PA-C  pantoprazole (PROTONIX) 40 MG tablet TAKE 1 TABLET BY MOUTH DAILY. Patient taking differently: Take 40 mg by mouth daily.  02/14/18   [name], MD  pregabalin (LYRICA) 75 MG capsule Take 1 capsule (75 mg total) by mouth daily. 09/19/18   Jamse Arn, MD  senna-docusate (SENOKOT-S) 8.6-50 MG tablet Take 1 tablet by mouth 2 (two) times daily. Patient taking differently: Take 1 tablet by mouth daily as needed for moderate constipation.  08/30/16   Geradine Girt, DO  traMADol (ULTRAM) 50 MG tablet Take 1 tablet (50 mg total) by mouth every 12 (twelve) hours as needed for severe pain. 11/17/18   Katy Apo, NP  trimethoprim-polymyxin b (POLYTRIM) ophthalmic solution Place 1 drop into both eyes See admin instructions. Instill one drop into both eyes four times daily for 2 days after each monthly eye injection 06/16/17   [provider]    Family History Family History  Problem Relation Age of Onset  . Allergic rhinitis Mother   . Hypertension Father   . Allergic  rhinitis Father   . Hypertension Sister   . Allergic rhinitis Sister   . Hypertension Brother   . Allergic rhinitis Brother   . Breast cancer Cousin        x2    Social History Social History   Tobacco Use  . Smoking status: Former Smoker    Years: 20.00    Types: Cigarettes    Quit  date: 11/2016    Years since quitting: 2.0  . Smokeless tobacco: Never Used  Substance Use Topics  . Alcohol use: No    Alcohol/week: 0.0 standard drinks  . Drug use: No     Allergies   Penicillins, Repatha [evolocumab], and Lisinopril   Review of Systems Review of Systems  Constitutional: Positive for fatigue. Negative for appetite change, chills and fever.  Respiratory: Negative for chest tightness, shortness of breath and wheezing.   Cardiovascular: Negative for chest pain and palpitations.  Gastrointestinal: Negative for nausea and vomiting.  Musculoskeletal: Positive for arthralgias, gait problem, joint swelling and myalgias. Negative for back pain.  Skin: Positive for wound. Negative for color change and rash.  Allergic/Immunologic: Positive for environmental allergies and immunocompromised state. Negative for food allergies.  Neurological: Positive for light-headedness and numbness. Negative for tremors, seizures, syncope, facial asymmetry, speech difficulty, weakness and headaches.  Hematological: Negative for adenopathy. Bruises/bleeds easily.     Physical Exam Triage Vital Signs ED Triage Vitals  Enc Vitals Group     BP 11/17/18 1237 (!) 169/80     Pulse Rate 11/17/18 1237 80     Resp 11/17/18 1237 18     Temp 11/17/18 1237 98.4 F (36.9 C)     Temp Source 11/17/18 1237 Oral     SpO2 11/17/18 1237 98 %     Weight --      Height --      Head Circumference --      Peak Flow --      Pain Score 11/17/18 1234 10     Pain Loc --      Pain Edu? --      Excl. in [place]? --    No data found.  Updated Vital Signs BP (!) 169/80 (BP Location: Right Arm)   Pulse 80   Temp 98.4  F (36.9 C) (Oral)   Resp 18   SpO2 98%   Visual Acuity Right Eye Distance:   Left Eye Distance:   Bilateral Distance:    Right Eye Near:   Left Eye Near:    Bilateral Near:     Physical Exam Vitals signs and nursing note reviewed.  Constitutional:      General: She is awake. She is not in acute distress.    Appearance: She is well-developed and well-groomed. She is not ill-appearing.     Comments: Patient sitting on exam table in no acute distress but appears in pain and continues to rub the skin around her knees due to pain.   HENT:     Head: Normocephalic and atraumatic.     Right Ear: Hearing and external ear normal.     Left Ear: Hearing and external ear normal.  Eyes:     General: Lids are normal. Vision grossly intact.     Extraocular Movements: Extraocular movements intact.     Conjunctiva/sclera:     Left eye: Hemorrhage (mild lateral subconjunctival hemorrhage) present.  Neck:     Musculoskeletal: Normal range of motion.  Cardiovascular:     Rate and Rhythm: Normal rate.  Pulmonary:     Effort: Pulmonary effort is normal.  Musculoskeletal:        General: Swelling, tenderness and signs of injury present.     Right knee: She exhibits decreased range of motion, swelling and laceration (small abrasion). She exhibits no ecchymosis, no erythema and normal alignment. Tenderness found. Medial joint line tenderness noted.     Left knee: She exhibits decreased  range of motion, swelling, laceration (abrasion present) and erythema. She exhibits no ecchymosis and no deformity. Tenderness found. Medial joint line, lateral joint line and patellar tendon tenderness noted.     Right hand: She exhibits tenderness (minimal) and deformity. She exhibits normal range of motion, normal capillary refill, no laceration and no swelling. Normal sensation noted. Normal strength noted.       Hands:       Legs:     Comments: Has decreased range of motion of both knees- left worse than right.  Only able to passively move left leg and knee. 2cm oval abrasion present just below patella on left knee and 1cm smaller abrasion present on right knee. Open wound is pink with no distinct discharge or redness surrounding wounds. Left knee is swollen around and above patella and very tender above and lateral to patella. Minimal swelling of right knee but tender above and medial to patella. Normal distal pedal pulses with slight decrease in sensation in both feet present.   Right hand with 5 to 6 mm round cyst-like mass in center palm area. Mobile and firm with minimal tenderness. No redness or surrounding swelling. Has full range of motion of right hand with no motor of neuro deficits noted. Good capillary refill.   Skin:    General: Skin is warm and dry.     Capillary Refill: Capillary refill takes less than 2 seconds.     Coloration: Skin is ashen.     Findings: Abrasion (both knees) present. No bruising or rash.  Neurological:     General: No focal deficit present.     Mental Status: She is alert and oriented to person, place, and time.     Motor: Motor function is intact.     Gait: Gait abnormal.     Comments: Difficulty bearing weight on left knee/leg.  Psychiatric:        Mood and Affect: Mood normal.        Behavior: Behavior normal. Behavior is cooperative.        Thought Content: Thought content normal.        Judgment: Judgment normal.      UC Treatments / Results  Labs (all labs ordered are listed, but only abnormal results are displayed) Labs Reviewed - No data to display  EKG   Radiology Dg Knee Complete 4 Views Left  Result Date: 11/17/2018 CLINICAL DATA:  48 year old female status post fall 5 days ago with persistent knee pain, abrasion. EXAM: LEFT KNEE - COMPLETE 4+ VIEW COMPARISON:  None. FINDINGS: Chronic degenerative changes along the undersurface of the patella along with age advanced patellofemoral degenerative spurring. No patella fracture. No joint effusion is  evident. Chronic irregularity at the tibial tuberosity. Medial and lateral compartment joint spaces appear preserved. No acute osseous abnormality identified. IMPRESSION: 1. No acute fracture or dislocation identified about the left knee. 2. Age advanced degenerative changes at the patellofemoral joint. Electronically Signed   By: Genevie Marcio Hoque M.D.   On: 11/17/2018 13:54   Dg Hand Complete Right  Result Date: 11/17/2018 CLINICAL DATA:  Right mass present in central palmar area for past 2 weeks- no known injury. EXAM: RIGHT HAND - COMPLETE 3+ VIEW COMPARISON:  No recent. FINDINGS: No acute bony or joint abnormality. No evidence of fracture or dislocation. Soft tissue swelling cannot be excluded. No radiopaque foreign body. If further evaluation for soft tissue mass is needed MRI can be obtained. IMPRESSION: No acute or focal bony abnormality. Soft tissue  swelling about the hand cannot be excluded. If further evaluation for mass lesion is needed MRI can be obtained. Electronically Signed   By: Marcello Moores  Register   On: 11/17/2018 13:36    Procedures Procedures (including critical care time)  Medications Ordered in UC Medications - No data to display  Initial Impression / Assessment and Plan / UC Course  I have reviewed the triage vital signs and the nursing notes.  Pertinent labs & imaging results that were available during my care of the patient were reviewed by me and considered in my medical decision making (see chart for details).    Reviewed left knee imaging with Dr. Joseph Art. Also consulted with Orthopedic Hilbert Odor who also reviewed imaging. Concern over possible patellar tendon or ligament rupture. No specific findings noted on Radiology report. Degenerative changes seen. Due to concern over potential injury that can not be determined solely by x-ray, recommend left knee immobilizer and follow-up with Orthopedic in 3 to 4 days (since this is Friday afternoon and unable to see Orthopedic in  office on the weekend). Applied Bacitracin ointment to both knees and covered with gauze. Since patient's renal clearance is poor and she is allergic to PCN, will treat with topical antibiotics for now. Start Bactroban cream- apply twice a day to abrasions. Wear left knee immobilizer during the day, may take off at night if desired. Since unable to take NSAIDs and Tylenol has not been helpful, will trial Tramadol 50mg  every 12 hours as needed for pain. Call Dr. Doreatha Martin, Orthopedic, today to schedule appointment for follow-up for early next week. Reviewed that possible cyst in right hand may need further evaluation- recommend discuss with Orthopedic or PCP. If pain and swelling gets worse or increased redness, swelling or discharge occurs at the abrasion sites, go to the ER ASAP. Otherwise, follow-up with the Orthopedic next week as planned.  Final Clinical Impressions(s) / UC Diagnoses   Final diagnoses:  Fall at home, initial encounter  Acute pain of left knee  Acute pain of right knee  Abrasion of both knees  Subcutaneous mass of right hand     Discharge Instructions     Recommend use Bactroban cream- apply at least twice a day to abrasions on both knees as needed and cover with gauze/bandage. Wear left knee immobilizer during the day- may take off at night to sleep if desired. May take Tramadol 50mg  every 12 hours as needed for pain. Call Dr. Doreatha Martin, Orthopedic, today to schedule appointment for follow-up early next week. If pain and swelling gets worse or more redness, swelling and discharge occurs at abrasion site, go to the ER ASAP. Otherwise, follow-up with the Orthopedic as planned.     ED Prescriptions    Medication Sig Dispense Auth. Provider   mupirocin cream (BACTROBAN) 2 % Apply 1 application topically 2 (two) times daily as needed. To affected areas and cover with non-stick gauze. 15 g Katy Apo, NP   traMADol (ULTRAM) 50 MG tablet Take 1 tablet (50 mg total) by mouth every  12 (twelve) hours as needed for severe pain. 10 tablet Susan Horton, Nicholes Stairs, NP     I have reviewed the PDMP during this encounter. Last active Rx was for Vicodin in Sept 2020 for 15 tablets for acute pain. No other active Rx. I feel the benefits outweigh the risks of prescribing a controlled medication at this time.    Katy Apo, NP 11/17/18 [phone]

## 2018-11-17 NOTE — ED Triage Notes (Signed)
Pt states she slipped and fell 5 days ago and injured her knees. Pt sates she is having bilateral knee pan, swollen knees and the knees "feels hot"   Pt reports having a knot in her right hand x 2 weeks.

## 2018-11-17 NOTE — Discharge Instructions (Addendum)
Recommend use Bactroban cream- apply at least twice a day to abrasions on both knees as needed and cover with gauze/bandage. Wear left knee immobilizer during the day- may take off at night to sleep if desired. May take Tramadol 50mg  every 12 hours as needed for pain. Call Dr. Doreatha Martin, Orthopedic, today to schedule appointment for follow-up early next week. If pain and swelling gets worse or more redness, swelling and discharge occurs at abrasion site, go to the ER ASAP. Otherwise, follow-up with the Orthopedic as planned.

## 2018-11-18 DIAGNOSIS — D509 Iron deficiency anemia, unspecified: Secondary | ICD-10-CM | POA: Diagnosis not present

## 2018-11-18 DIAGNOSIS — Z4932 Encounter for adequacy testing for peritoneal dialysis: Secondary | ICD-10-CM | POA: Diagnosis not present

## 2018-11-18 DIAGNOSIS — N186 End stage renal disease: Secondary | ICD-10-CM | POA: Diagnosis not present

## 2018-11-18 DIAGNOSIS — Z992 Dependence on renal dialysis: Secondary | ICD-10-CM | POA: Diagnosis not present

## 2018-11-18 DIAGNOSIS — N2581 Secondary hyperparathyroidism of renal origin: Secondary | ICD-10-CM | POA: Diagnosis not present

## 2018-11-18 DIAGNOSIS — D631 Anemia in chronic kidney disease: Secondary | ICD-10-CM | POA: Diagnosis not present

## 2018-11-19 DIAGNOSIS — D509 Iron deficiency anemia, unspecified: Secondary | ICD-10-CM | POA: Diagnosis not present

## 2018-11-19 DIAGNOSIS — N186 End stage renal disease: Secondary | ICD-10-CM | POA: Diagnosis not present

## 2018-11-19 DIAGNOSIS — D631 Anemia in chronic kidney disease: Secondary | ICD-10-CM | POA: Diagnosis not present

## 2018-11-19 DIAGNOSIS — Z4932 Encounter for adequacy testing for peritoneal dialysis: Secondary | ICD-10-CM | POA: Diagnosis not present

## 2018-11-19 DIAGNOSIS — N2581 Secondary hyperparathyroidism of renal origin: Secondary | ICD-10-CM | POA: Diagnosis not present

## 2018-11-19 DIAGNOSIS — Z992 Dependence on renal dialysis: Secondary | ICD-10-CM | POA: Diagnosis not present

## 2018-11-20 DIAGNOSIS — N186 End stage renal disease: Secondary | ICD-10-CM | POA: Diagnosis not present

## 2018-11-20 DIAGNOSIS — D509 Iron deficiency anemia, unspecified: Secondary | ICD-10-CM | POA: Diagnosis not present

## 2018-11-20 DIAGNOSIS — Z4932 Encounter for adequacy testing for peritoneal dialysis: Secondary | ICD-10-CM | POA: Diagnosis not present

## 2018-11-20 DIAGNOSIS — N2581 Secondary hyperparathyroidism of renal origin: Secondary | ICD-10-CM | POA: Diagnosis not present

## 2018-11-20 DIAGNOSIS — D631 Anemia in chronic kidney disease: Secondary | ICD-10-CM | POA: Diagnosis not present

## 2018-11-20 DIAGNOSIS — Z992 Dependence on renal dialysis: Secondary | ICD-10-CM | POA: Diagnosis not present

## 2018-11-21 DIAGNOSIS — N2581 Secondary hyperparathyroidism of renal origin: Secondary | ICD-10-CM | POA: Diagnosis not present

## 2018-11-21 DIAGNOSIS — N186 End stage renal disease: Secondary | ICD-10-CM | POA: Diagnosis not present

## 2018-11-21 DIAGNOSIS — D631 Anemia in chronic kidney disease: Secondary | ICD-10-CM | POA: Diagnosis not present

## 2018-11-21 DIAGNOSIS — D509 Iron deficiency anemia, unspecified: Secondary | ICD-10-CM | POA: Diagnosis not present

## 2018-11-21 DIAGNOSIS — Z4932 Encounter for adequacy testing for peritoneal dialysis: Secondary | ICD-10-CM | POA: Diagnosis not present

## 2018-11-21 DIAGNOSIS — Z992 Dependence on renal dialysis: Secondary | ICD-10-CM | POA: Diagnosis not present

## 2018-11-22 DIAGNOSIS — N186 End stage renal disease: Secondary | ICD-10-CM | POA: Diagnosis not present

## 2018-11-22 DIAGNOSIS — Z992 Dependence on renal dialysis: Secondary | ICD-10-CM | POA: Diagnosis not present

## 2018-11-22 DIAGNOSIS — D631 Anemia in chronic kidney disease: Secondary | ICD-10-CM | POA: Diagnosis not present

## 2018-11-22 DIAGNOSIS — N2581 Secondary hyperparathyroidism of renal origin: Secondary | ICD-10-CM | POA: Diagnosis not present

## 2018-11-22 DIAGNOSIS — Z4932 Encounter for adequacy testing for peritoneal dialysis: Secondary | ICD-10-CM | POA: Diagnosis not present

## 2018-11-22 DIAGNOSIS — D509 Iron deficiency anemia, unspecified: Secondary | ICD-10-CM | POA: Diagnosis not present

## 2018-11-23 DIAGNOSIS — N186 End stage renal disease: Secondary | ICD-10-CM | POA: Diagnosis not present

## 2018-11-23 DIAGNOSIS — D509 Iron deficiency anemia, unspecified: Secondary | ICD-10-CM | POA: Diagnosis not present

## 2018-11-23 DIAGNOSIS — N2581 Secondary hyperparathyroidism of renal origin: Secondary | ICD-10-CM | POA: Diagnosis not present

## 2018-11-23 DIAGNOSIS — Z4932 Encounter for adequacy testing for peritoneal dialysis: Secondary | ICD-10-CM | POA: Diagnosis not present

## 2018-11-23 DIAGNOSIS — Z992 Dependence on renal dialysis: Secondary | ICD-10-CM | POA: Diagnosis not present

## 2018-11-23 DIAGNOSIS — D631 Anemia in chronic kidney disease: Secondary | ICD-10-CM | POA: Diagnosis not present

## 2018-11-24 DIAGNOSIS — N186 End stage renal disease: Secondary | ICD-10-CM | POA: Diagnosis not present

## 2018-11-24 DIAGNOSIS — Z4932 Encounter for adequacy testing for peritoneal dialysis: Secondary | ICD-10-CM | POA: Diagnosis not present

## 2018-11-24 DIAGNOSIS — D509 Iron deficiency anemia, unspecified: Secondary | ICD-10-CM | POA: Diagnosis not present

## 2018-11-24 DIAGNOSIS — D631 Anemia in chronic kidney disease: Secondary | ICD-10-CM | POA: Diagnosis not present

## 2018-11-24 DIAGNOSIS — N2581 Secondary hyperparathyroidism of renal origin: Secondary | ICD-10-CM | POA: Diagnosis not present

## 2018-11-24 DIAGNOSIS — Z992 Dependence on renal dialysis: Secondary | ICD-10-CM | POA: Diagnosis not present

## 2018-11-25 DIAGNOSIS — D631 Anemia in chronic kidney disease: Secondary | ICD-10-CM | POA: Diagnosis not present

## 2018-11-25 DIAGNOSIS — N2581 Secondary hyperparathyroidism of renal origin: Secondary | ICD-10-CM | POA: Diagnosis not present

## 2018-11-25 DIAGNOSIS — Z4932 Encounter for adequacy testing for peritoneal dialysis: Secondary | ICD-10-CM | POA: Diagnosis not present

## 2018-11-25 DIAGNOSIS — N186 End stage renal disease: Secondary | ICD-10-CM | POA: Diagnosis not present

## 2018-11-25 DIAGNOSIS — D509 Iron deficiency anemia, unspecified: Secondary | ICD-10-CM | POA: Diagnosis not present

## 2018-11-25 DIAGNOSIS — Z992 Dependence on renal dialysis: Secondary | ICD-10-CM | POA: Diagnosis not present

## 2018-11-26 DIAGNOSIS — N2581 Secondary hyperparathyroidism of renal origin: Secondary | ICD-10-CM | POA: Diagnosis not present

## 2018-11-26 DIAGNOSIS — D631 Anemia in chronic kidney disease: Secondary | ICD-10-CM | POA: Diagnosis not present

## 2018-11-26 DIAGNOSIS — Z992 Dependence on renal dialysis: Secondary | ICD-10-CM | POA: Diagnosis not present

## 2018-11-26 DIAGNOSIS — D509 Iron deficiency anemia, unspecified: Secondary | ICD-10-CM | POA: Diagnosis not present

## 2018-11-26 DIAGNOSIS — N186 End stage renal disease: Secondary | ICD-10-CM | POA: Diagnosis not present

## 2018-11-26 DIAGNOSIS — Z4932 Encounter for adequacy testing for peritoneal dialysis: Secondary | ICD-10-CM | POA: Diagnosis not present

## 2018-11-27 DIAGNOSIS — D509 Iron deficiency anemia, unspecified: Secondary | ICD-10-CM | POA: Diagnosis not present

## 2018-11-27 DIAGNOSIS — Z4932 Encounter for adequacy testing for peritoneal dialysis: Secondary | ICD-10-CM | POA: Diagnosis not present

## 2018-11-27 DIAGNOSIS — N186 End stage renal disease: Secondary | ICD-10-CM | POA: Diagnosis not present

## 2018-11-27 DIAGNOSIS — N2581 Secondary hyperparathyroidism of renal origin: Secondary | ICD-10-CM | POA: Diagnosis not present

## 2018-11-27 DIAGNOSIS — Z992 Dependence on renal dialysis: Secondary | ICD-10-CM | POA: Diagnosis not present

## 2018-11-27 DIAGNOSIS — D631 Anemia in chronic kidney disease: Secondary | ICD-10-CM | POA: Diagnosis not present

## 2018-11-27 DIAGNOSIS — M79641 Pain in right hand: Secondary | ICD-10-CM | POA: Diagnosis not present

## 2018-11-27 DIAGNOSIS — M25562 Pain in left knee: Secondary | ICD-10-CM | POA: Diagnosis not present

## 2018-11-28 DIAGNOSIS — N2581 Secondary hyperparathyroidism of renal origin: Secondary | ICD-10-CM | POA: Diagnosis not present

## 2018-11-28 DIAGNOSIS — N186 End stage renal disease: Secondary | ICD-10-CM | POA: Diagnosis not present

## 2018-11-28 DIAGNOSIS — Z992 Dependence on renal dialysis: Secondary | ICD-10-CM | POA: Diagnosis not present

## 2018-11-28 DIAGNOSIS — Z4932 Encounter for adequacy testing for peritoneal dialysis: Secondary | ICD-10-CM | POA: Diagnosis not present

## 2018-11-28 DIAGNOSIS — D631 Anemia in chronic kidney disease: Secondary | ICD-10-CM | POA: Diagnosis not present

## 2018-11-28 DIAGNOSIS — D509 Iron deficiency anemia, unspecified: Secondary | ICD-10-CM | POA: Diagnosis not present

## 2018-11-29 DIAGNOSIS — D509 Iron deficiency anemia, unspecified: Secondary | ICD-10-CM | POA: Diagnosis not present

## 2018-11-29 DIAGNOSIS — Z4932 Encounter for adequacy testing for peritoneal dialysis: Secondary | ICD-10-CM | POA: Diagnosis not present

## 2018-11-29 DIAGNOSIS — D631 Anemia in chronic kidney disease: Secondary | ICD-10-CM | POA: Diagnosis not present

## 2018-11-29 DIAGNOSIS — N2581 Secondary hyperparathyroidism of renal origin: Secondary | ICD-10-CM | POA: Diagnosis not present

## 2018-11-29 DIAGNOSIS — Z992 Dependence on renal dialysis: Secondary | ICD-10-CM | POA: Diagnosis not present

## 2018-11-29 DIAGNOSIS — N186 End stage renal disease: Secondary | ICD-10-CM | POA: Diagnosis not present

## 2018-11-30 DIAGNOSIS — N186 End stage renal disease: Secondary | ICD-10-CM | POA: Diagnosis not present

## 2018-11-30 DIAGNOSIS — D631 Anemia in chronic kidney disease: Secondary | ICD-10-CM | POA: Diagnosis not present

## 2018-11-30 DIAGNOSIS — N2581 Secondary hyperparathyroidism of renal origin: Secondary | ICD-10-CM | POA: Diagnosis not present

## 2018-11-30 DIAGNOSIS — Z992 Dependence on renal dialysis: Secondary | ICD-10-CM | POA: Diagnosis not present

## 2018-11-30 DIAGNOSIS — D509 Iron deficiency anemia, unspecified: Secondary | ICD-10-CM | POA: Diagnosis not present

## 2018-11-30 DIAGNOSIS — Z4932 Encounter for adequacy testing for peritoneal dialysis: Secondary | ICD-10-CM | POA: Diagnosis not present

## 2018-12-01 DIAGNOSIS — N2581 Secondary hyperparathyroidism of renal origin: Secondary | ICD-10-CM | POA: Diagnosis not present

## 2018-12-01 DIAGNOSIS — N186 End stage renal disease: Secondary | ICD-10-CM | POA: Diagnosis not present

## 2018-12-01 DIAGNOSIS — D631 Anemia in chronic kidney disease: Secondary | ICD-10-CM | POA: Diagnosis not present

## 2018-12-01 DIAGNOSIS — Z4932 Encounter for adequacy testing for peritoneal dialysis: Secondary | ICD-10-CM | POA: Diagnosis not present

## 2018-12-01 DIAGNOSIS — D509 Iron deficiency anemia, unspecified: Secondary | ICD-10-CM | POA: Diagnosis not present

## 2018-12-01 DIAGNOSIS — Z992 Dependence on renal dialysis: Secondary | ICD-10-CM | POA: Diagnosis not present

## 2018-12-02 DIAGNOSIS — N2581 Secondary hyperparathyroidism of renal origin: Secondary | ICD-10-CM | POA: Diagnosis not present

## 2018-12-02 DIAGNOSIS — N186 End stage renal disease: Secondary | ICD-10-CM | POA: Diagnosis not present

## 2018-12-02 DIAGNOSIS — Z4932 Encounter for adequacy testing for peritoneal dialysis: Secondary | ICD-10-CM | POA: Diagnosis not present

## 2018-12-02 DIAGNOSIS — D631 Anemia in chronic kidney disease: Secondary | ICD-10-CM | POA: Diagnosis not present

## 2018-12-02 DIAGNOSIS — Z992 Dependence on renal dialysis: Secondary | ICD-10-CM | POA: Diagnosis not present

## 2018-12-02 DIAGNOSIS — D509 Iron deficiency anemia, unspecified: Secondary | ICD-10-CM | POA: Diagnosis not present

## 2018-12-03 DIAGNOSIS — N2581 Secondary hyperparathyroidism of renal origin: Secondary | ICD-10-CM | POA: Diagnosis not present

## 2018-12-03 DIAGNOSIS — N186 End stage renal disease: Secondary | ICD-10-CM | POA: Diagnosis not present

## 2018-12-03 DIAGNOSIS — D509 Iron deficiency anemia, unspecified: Secondary | ICD-10-CM | POA: Diagnosis not present

## 2018-12-03 DIAGNOSIS — Z4932 Encounter for adequacy testing for peritoneal dialysis: Secondary | ICD-10-CM | POA: Diagnosis not present

## 2018-12-03 DIAGNOSIS — D631 Anemia in chronic kidney disease: Secondary | ICD-10-CM | POA: Diagnosis not present

## 2018-12-03 DIAGNOSIS — Z992 Dependence on renal dialysis: Secondary | ICD-10-CM | POA: Diagnosis not present

## 2018-12-04 DIAGNOSIS — N186 End stage renal disease: Secondary | ICD-10-CM | POA: Diagnosis not present

## 2018-12-04 DIAGNOSIS — N2581 Secondary hyperparathyroidism of renal origin: Secondary | ICD-10-CM | POA: Diagnosis not present

## 2018-12-04 DIAGNOSIS — Z992 Dependence on renal dialysis: Secondary | ICD-10-CM | POA: Diagnosis not present

## 2018-12-04 DIAGNOSIS — Z4932 Encounter for adequacy testing for peritoneal dialysis: Secondary | ICD-10-CM | POA: Diagnosis not present

## 2018-12-04 DIAGNOSIS — D509 Iron deficiency anemia, unspecified: Secondary | ICD-10-CM | POA: Diagnosis not present

## 2018-12-04 DIAGNOSIS — D631 Anemia in chronic kidney disease: Secondary | ICD-10-CM | POA: Diagnosis not present

## 2018-12-05 DIAGNOSIS — N186 End stage renal disease: Secondary | ICD-10-CM | POA: Diagnosis not present

## 2018-12-05 DIAGNOSIS — D509 Iron deficiency anemia, unspecified: Secondary | ICD-10-CM | POA: Diagnosis not present

## 2018-12-05 DIAGNOSIS — Z4932 Encounter for adequacy testing for peritoneal dialysis: Secondary | ICD-10-CM | POA: Diagnosis not present

## 2018-12-05 DIAGNOSIS — N2581 Secondary hyperparathyroidism of renal origin: Secondary | ICD-10-CM | POA: Diagnosis not present

## 2018-12-05 DIAGNOSIS — D631 Anemia in chronic kidney disease: Secondary | ICD-10-CM | POA: Diagnosis not present

## 2018-12-05 DIAGNOSIS — Z992 Dependence on renal dialysis: Secondary | ICD-10-CM | POA: Diagnosis not present

## 2018-12-06 DIAGNOSIS — Z992 Dependence on renal dialysis: Secondary | ICD-10-CM | POA: Diagnosis not present

## 2018-12-06 DIAGNOSIS — D631 Anemia in chronic kidney disease: Secondary | ICD-10-CM | POA: Diagnosis not present

## 2018-12-06 DIAGNOSIS — N186 End stage renal disease: Secondary | ICD-10-CM | POA: Diagnosis not present

## 2018-12-06 DIAGNOSIS — D509 Iron deficiency anemia, unspecified: Secondary | ICD-10-CM | POA: Diagnosis not present

## 2018-12-06 DIAGNOSIS — N2581 Secondary hyperparathyroidism of renal origin: Secondary | ICD-10-CM | POA: Diagnosis not present

## 2018-12-06 DIAGNOSIS — Z4932 Encounter for adequacy testing for peritoneal dialysis: Secondary | ICD-10-CM | POA: Diagnosis not present

## 2018-12-07 DIAGNOSIS — Z4932 Encounter for adequacy testing for peritoneal dialysis: Secondary | ICD-10-CM | POA: Diagnosis not present

## 2018-12-07 DIAGNOSIS — N186 End stage renal disease: Secondary | ICD-10-CM | POA: Diagnosis not present

## 2018-12-07 DIAGNOSIS — D509 Iron deficiency anemia, unspecified: Secondary | ICD-10-CM | POA: Diagnosis not present

## 2018-12-07 DIAGNOSIS — N2581 Secondary hyperparathyroidism of renal origin: Secondary | ICD-10-CM | POA: Diagnosis not present

## 2018-12-07 DIAGNOSIS — Z992 Dependence on renal dialysis: Secondary | ICD-10-CM | POA: Diagnosis not present

## 2018-12-07 DIAGNOSIS — D631 Anemia in chronic kidney disease: Secondary | ICD-10-CM | POA: Diagnosis not present

## 2018-12-08 DIAGNOSIS — D509 Iron deficiency anemia, unspecified: Secondary | ICD-10-CM | POA: Diagnosis not present

## 2018-12-08 DIAGNOSIS — D631 Anemia in chronic kidney disease: Secondary | ICD-10-CM | POA: Diagnosis not present

## 2018-12-08 DIAGNOSIS — Z992 Dependence on renal dialysis: Secondary | ICD-10-CM | POA: Diagnosis not present

## 2018-12-08 DIAGNOSIS — N186 End stage renal disease: Secondary | ICD-10-CM | POA: Diagnosis not present

## 2018-12-08 DIAGNOSIS — N2581 Secondary hyperparathyroidism of renal origin: Secondary | ICD-10-CM | POA: Diagnosis not present

## 2018-12-08 DIAGNOSIS — Z4932 Encounter for adequacy testing for peritoneal dialysis: Secondary | ICD-10-CM | POA: Diagnosis not present

## 2018-12-09 DIAGNOSIS — D631 Anemia in chronic kidney disease: Secondary | ICD-10-CM | POA: Diagnosis not present

## 2018-12-09 DIAGNOSIS — N186 End stage renal disease: Secondary | ICD-10-CM | POA: Diagnosis not present

## 2018-12-09 DIAGNOSIS — Z4932 Encounter for adequacy testing for peritoneal dialysis: Secondary | ICD-10-CM | POA: Diagnosis not present

## 2018-12-09 DIAGNOSIS — Z992 Dependence on renal dialysis: Secondary | ICD-10-CM | POA: Diagnosis not present

## 2018-12-09 DIAGNOSIS — N2581 Secondary hyperparathyroidism of renal origin: Secondary | ICD-10-CM | POA: Diagnosis not present

## 2018-12-09 DIAGNOSIS — D509 Iron deficiency anemia, unspecified: Secondary | ICD-10-CM | POA: Diagnosis not present

## 2018-12-10 DIAGNOSIS — Z992 Dependence on renal dialysis: Secondary | ICD-10-CM | POA: Diagnosis not present

## 2018-12-10 DIAGNOSIS — N186 End stage renal disease: Secondary | ICD-10-CM | POA: Diagnosis not present

## 2018-12-10 DIAGNOSIS — N2581 Secondary hyperparathyroidism of renal origin: Secondary | ICD-10-CM | POA: Diagnosis not present

## 2018-12-10 DIAGNOSIS — D631 Anemia in chronic kidney disease: Secondary | ICD-10-CM | POA: Diagnosis not present

## 2018-12-10 DIAGNOSIS — Z4932 Encounter for adequacy testing for peritoneal dialysis: Secondary | ICD-10-CM | POA: Diagnosis not present

## 2018-12-10 DIAGNOSIS — D509 Iron deficiency anemia, unspecified: Secondary | ICD-10-CM | POA: Diagnosis not present

## 2018-12-11 DIAGNOSIS — D509 Iron deficiency anemia, unspecified: Secondary | ICD-10-CM | POA: Diagnosis not present

## 2018-12-11 DIAGNOSIS — D631 Anemia in chronic kidney disease: Secondary | ICD-10-CM | POA: Diagnosis not present

## 2018-12-11 DIAGNOSIS — N2581 Secondary hyperparathyroidism of renal origin: Secondary | ICD-10-CM | POA: Diagnosis not present

## 2018-12-11 DIAGNOSIS — N186 End stage renal disease: Secondary | ICD-10-CM | POA: Diagnosis not present

## 2018-12-11 DIAGNOSIS — Z992 Dependence on renal dialysis: Secondary | ICD-10-CM | POA: Diagnosis not present

## 2018-12-11 DIAGNOSIS — Z4932 Encounter for adequacy testing for peritoneal dialysis: Secondary | ICD-10-CM | POA: Diagnosis not present

## 2018-12-12 ENCOUNTER — Other Ambulatory Visit: Payer: Self-pay

## 2018-12-12 ENCOUNTER — Encounter: Payer: Self-pay | Admitting: Vascular Surgery

## 2018-12-12 ENCOUNTER — Ambulatory Visit (INDEPENDENT_AMBULATORY_CARE_PROVIDER_SITE_OTHER): Payer: Medicare Other | Admitting: Vascular Surgery

## 2018-12-12 ENCOUNTER — Other Ambulatory Visit (HOSPITAL_COMMUNITY)
Admission: RE | Admit: 2018-12-12 | Discharge: 2018-12-12 | Disposition: A | Discharge status: Home / Self Care | Payer: Medicare Other | Source: Ambulatory Visit | Attending: Vascular Surgery | Admitting: Vascular Surgery

## 2018-12-12 VITALS — BP 166/83 | HR 97 | Temp 97.3°F | Resp 16 | Ht 65.0 in | Wt 162.0 lb

## 2018-12-12 DIAGNOSIS — Z20828 Contact with and (suspected) exposure to other viral communicable diseases: Secondary | ICD-10-CM | POA: Insufficient documentation

## 2018-12-12 DIAGNOSIS — Z01812 Encounter for preprocedural laboratory examination: Secondary | ICD-10-CM | POA: Insufficient documentation

## 2018-12-12 DIAGNOSIS — I739 Peripheral vascular disease, unspecified: Secondary | ICD-10-CM | POA: Diagnosis not present

## 2018-12-12 LAB — SARS CORONAVIRUS 2 (TAT 6-24 HRS): SARS Coronavirus 2: NEGATIVE

## 2018-12-12 NOTE — Progress Notes (Signed)
Patient name: Susan Horton MRN: [id number] DOB: 1970/04/06 Sex: female  REASON FOR CONSULT: Follow-up for PAD  HPI: Susan Horton is a 48 y.o. female, with multiple medical problems including end-stage renal disease as well as diabetes and severe peripheral neuropathy who presents for ongoing follow-up of her lower extremity peripheral vascular disease.  She was initially evaluated in September and was under the impression that she was referred here in order to make her a transplant candidate for kidney transplant after evaluation in [place].  I reached out to Dr. Jimmy Footman and ultimately his message indicated that she had been declined for transplant in [place].  She feels like her legs are worse.  She states she can hardly walk from here to the car without burning in the calves bilaterally and feeling like her legs are going to give out.  She thinks the left leg is slightly worse than the right.  This is also in the setting of chronic neuropathy in both feet that has been ongoing for years.  No previous lower extremity interventions.  States she quit smoking last year.  Past Medical History:  Diagnosis Date  . Anemia   . Angio-edema   . Asthma   . CAD (coronary artery disease)    DES to mid LAD July 2018, residual moderate RCA disease  . Cataract   . CKD (chronic kidney disease) stage 4, GFR 15-29 ml/min (HCC)    Dialysis T/Th/Sa  . DVT (deep venous thrombosis) ([place])    1996 during pregnancy, 2015 left leg  . Eczema   . Essential hypertension 12/11/2015  . Gastroparesis   . GERD (gastroesophageal reflux disease)   . Hidradenitis   . Migraine   . Neuropathy   . Peripheral vascular disease ([place])    blood clot in leg  . Type 2 diabetes mellitus ([place])   . Type II diabetes mellitus ([place])   . Urticaria     Past Surgical History:  Procedure Laterality Date  . A/V FISTULAGRAM N/A 06/22/2017   Procedure: A/V FISTULAGRAM - left arm;  Surgeon: [name], MD;   Location: [facility];  Service: Cardiovascular;  Laterality: N/A;  . ABDOMINOPLASTY    . ADENOIDECTOMY    . APPENDECTOMY  1995  . AV FISTULA PLACEMENT Left 02/01/2017   Procedure: ARTERIOVENOUS BRACHIOCEPHALIC (AV) FISTULA CREATION;  Surgeon: [name], MD;  Location: [facility];  Service: Vascular;  Laterality: Left;  . CHOLECYSTECTOMY  1993  . CORONARY ANGIOPLASTY WITH STENT PLACEMENT    . CORONARY STENT INTERVENTION N/A 08/05/2016   Procedure: Coronary Stent Intervention;  Surgeon: [name], MD;  Location: [facility];  Service: Cardiovascular;  Laterality: N/A;  . EXPLORATORY LAPAROTOMY  08/14/2005   lysis of adhesions, drainage of tubo-ovarian abscess  . FISTULA SUPERFICIALIZATION Left 09/14/2017   Procedure: FISTULA SUPERFICIALIZATION ARTERIOVENOUS FISTULA LEFT ARM;  Surgeon: [name], MD;  Location: [facility];  Service: Vascular;  Laterality: Left;  . HYDRADENITIS EXCISION  02/08/2011   Procedure: EXCISION HYDRADENITIS GROIN;  Surgeon: [name], MD;  Location: [facility];  Service: General;  Laterality: N/A;  Excisioin of Hidradenitis Left groin  . INGUINAL HIDRADENITIS EXCISION  07/06/2010   bilateral  . INSERTION OF DIALYSIS CATHETER N/A 09/14/2017   Procedure: INSERTION OF TUNNELED DIALYSIS CATHETER Right Internal Jugular;  Surgeon: [name], MD;  Location: [facility];  Service: Vascular;  Laterality: N/A;  . LEFT HEART CATH AND CORONARY ANGIOGRAPHY  N/A 08/05/2016   Procedure: Left Heart Cath and Coronary Angiography;  Surgeon: [name], MD;  Location: [facility];  Service: Cardiovascular;  Laterality: N/A;  . REDUCTION MAMMAPLASTY  2002  . TONSILLECTOMY    . TUBAL LIGATION  1996  . VAGINAL HYSTERECTOMY  08/04/2005   and cysto    Family History  Problem Relation Age of Onset  . Allergic rhinitis Mother   . Hypertension Father   . Allergic rhinitis Father   . Hypertension Sister   . Allergic rhinitis  Sister   . Hypertension Brother   . Allergic rhinitis Brother   . Breast cancer Cousin        x2    SOCIAL HISTORY: Social History   Socioeconomic History  . Marital status: Single    Spouse name: Not on file  . Number of children: 3  . Years of education: 83  . Highest education level: Not on file  Occupational History  . Occupation: Curator  Social Needs  . Financial resource strain: Not on file  . Food insecurity    Worry: Not on file    Inability: Not on file  . Transportation needs    Medical: Not on file    Non-medical: Not on file  Tobacco Use  . Smoking status: Former Smoker    Years: 20.00    Types: Cigarettes    Quit date: 11/2016    Years since quitting: 2.0  . Smokeless tobacco: Never Used  Substance and Sexual Activity  . Alcohol use: No    Alcohol/week: 0.0 standard drinks  . Drug use: No  . Sexual activity: Not on file  Lifestyle  . Physical activity    Days per week: Not on file    Minutes per session: Not on file  . Stress: Not on file  Relationships  . Social Herbalist on phone: Not on file    Gets together: Not on file    Attends religious service: Not on file    Active member of club or organization: Not on file    Attends meetings of clubs or organizations: Not on file    Relationship status: Not on file  . Intimate partner violence    Fear of current or ex partner: Not on file    Emotionally abused: Not on file    Physically abused: Not on file    Forced sexual activity: Not on file  Other Topics Concern  . Not on file  Social History Narrative   Lives with daughter in a 2 story home.  Has 3 children.  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  Education: college.     Allergies  Allergen Reactions  . Penicillins Anaphylaxis, Itching, Swelling, Rash and Other (See Comments)    Swelling of throat & whole mouth  Has patient had a PCN reaction causing immediate rash, facial/tongue/throat  swelling, SOB or lightheadedness with hypotension: Yes Has patient had a PCN reaction causing severe rash involving mucus membranes or skin necrosis: No Has patient had a PCN reaction that required hospitalization: No Has patient had a PCN reaction occurring within the last 10 years: No If all of the above answers are "NO", then may proceed with Cephalosporin use. Swelling of throat & whole mouth -angioedema Has patient had a PCN reaction causing immediate rash, facial/tongue/throat swelling, SOB or lightheadedness with hypotension: Yes Has patient had a PCN reaction causing severe rash involving mucus membranes or  skin necrosis: No Has patient had a PCN reaction that required hospitalization: No Has patient had a PCN reaction occurring within the last 10 years: No If all of the above answers are "NO", then may proceed with Cephalosporin use. Swelling of th Swelling of throat & whole mouth  Has patient had a PCN reaction causing immediate rash, facial/tongue/throat swelling, SOB or lightheadedness with hypotension: Yes Has patient had a PCN reaction causing severe rash involving mucus membranes or skin necrosis: No Has patient had a PCN reaction that required hospitalization: No Has patient had a PCN reaction occurring within the last 10 years: No If all of the above answers are "NO", then may proceed with Cephalosporin use. Swelling of tongue itching and rash   . Repatha [Evolocumab] Itching  . Lisinopril Cough    Current Outpatient Medications  Medication Sig Dispense Refill  . albuterol (PROVENTIL HFA;VENTOLIN HFA) 108 (90 BASE) MCG/ACT inhaler Inhale 1-2 puffs into the lungs every 6 (six) hours as needed for wheezing or shortness of breath.     [name] amitriptyline (ELAVIL) 10 MG tablet Take 1 tablet (10 mg total) by mouth at bedtime. 30 tablet 1  . aspirin 81 MG chewable tablet Chew 1 tablet (81 mg total) by mouth daily. 30 tablet 0  . calcitRIOL (ROCALTROL) 0.5 MCG capsule Take 1.5 mcg  by mouth daily.    . calcium acetate (PHOSLO) 667 MG capsule Take 1,334-2,001 mg by mouth See admin instructions. Take three capsules (2001 mg) by mouth up to three times daily with meals and two capsules (1334 mg) with snacks  10  . clopidogrel (PLAVIX) 75 MG tablet Take 1 tablet (75 mg total) by mouth daily with breakfast. 90 tablet 3  . Ferrous Sulfate (IRON) 325 (65 Fe) MG TABS Take 325 mg by mouth daily. 30 each 3  . Fluticasone-Salmeterol (ADVAIR) 250-50 MCG/DOSE AEPB Inhale 1 puff into the lungs 2 (two) times daily. (Patient taking differently: Inhale 1 puff into the lungs daily as needed (shortness of breath). ) 60 each 3  . gentamicin cream (GARAMYCIN) 0.1 % Apply 1 application topically See admin instructions. Apply to access site nightly    . Insulin Glargine, 1 Unit Dial, (TOUJEO SOLOSTAR) 300 UNIT/ML SOPN Inject 25 Units into the skin 2 (two) times a day. (Patient taking differently: Inject 50 Units into the skin 2 (two) times a day. ) 12 pen 3  . insulin regular (HUMULIN R) 100 units/mL injection Inject 0.2-0.25 mLs (20-25 Units total) into the skin See admin instructions. (Patient taking differently: Inject 7-15 Units into the skin 3 (three) times daily before meals. Per CBG) 45 mL 3  . lidocaine-prilocaine (EMLA) cream Apply 1 application topically See admin instructions. Apply small amount to access site (AVF) 30 minutes before hemodialysis, cover with occlusive dressing (Saran Wrap) if going out to dialysis center  5  . lovastatin (MEVACOR) 20 MG tablet Take 20 mg by mouth at bedtime.     . methocarbamol (ROBAXIN) 500 MG tablet Take 1 tablet (500 mg total) by mouth 2 (two) times daily as needed for muscle spasms. 60 tablet 1  . metoCLOPramide (REGLAN) 5 MG tablet TAKE 1 TABLET BY MOUTH 4 TIMES DAILY BEFORE MEALS AND AT BEDTIME. (Patient taking differently: Take 5 mg by mouth 4 (four) times daily -  before meals and at bedtime. ) 90 tablet 0  . metoprolol succinate (TOPROL-XL) 50 MG 24  hr tablet Take 1 tablet (50 mg total) by mouth daily. 90 tablet 3  .  montelukast (SINGULAIR) 10 MG tablet Take 1 tablet (10 mg total) by mouth at bedtime. (Patient taking differently: Take 10 mg by mouth at bedtime as needed (allergies). ) 30 tablet 5  . multivitamin (RENA-VIT) TABS tablet Take 1 tablet by mouth at bedtime.  0  . mupirocin cream (BACTROBAN) 2 % Apply 1 application topically 2 (two) times daily as needed. To affected areas and cover with non-stick gauze. 15 g 0  . nitroGLYCERIN (NITROSTAT) 0.4 MG SL tablet Place 1 tablet (0.4 mg total) under the tongue every 5 (five) minutes as needed for chest pain. 25 tablet 2  . pantoprazole (PROTONIX) 40 MG tablet TAKE 1 TABLET BY MOUTH DAILY. (Patient taking differently: Take 40 mg by mouth daily. ) 30 tablet 7  . pregabalin (LYRICA) 75 MG capsule Take 1 capsule (75 mg total) by mouth daily. 30 capsule 1  . senna-docusate (SENOKOT-S) 8.6-50 MG tablet Take 1 tablet by mouth 2 (two) times daily. (Patient taking differently: Take 1 tablet by mouth daily as needed for moderate constipation. )    . traMADol (ULTRAM) 50 MG tablet Take 1 tablet (50 mg total) by mouth every 12 (twelve) hours as needed for severe pain. 10 tablet 0  . trimethoprim-polymyxin b (POLYTRIM) ophthalmic solution Place 1 drop into both eyes See admin instructions. Instill one drop into both eyes four times daily for 2 days after each monthly eye injection  12  . isosorbide mononitrate (IMDUR) 30 MG 24 hr tablet Take 0.5 tablets (15 mg total) by mouth daily. 45 tablet 3   No current facility-administered medications for this visit.     REVIEW OF SYSTEMS:  [X]  denotes positive finding, [ ]  denotes negative finding Cardiac  Comments:  Chest pain or chest pressure:    Shortness of breath upon exertion:    Short of breath when lying flat:    Irregular heart rhythm:        Vascular    Pain in calf, thigh, or hip brought on by ambulation: x   Pain in feet at night that wakes  you up from your sleep:  x   Blood clot in your veins:    Leg swelling:         Pulmonary    Oxygen at home:    Productive cough:     Wheezing:         Neurologic    Sudden weakness in arms or legs:     Sudden numbness in arms or legs:     Sudden onset of difficulty speaking or slurred speech:    Temporary loss of vision in one eye:     Problems with dizziness:         Gastrointestinal    Blood in stool:     Vomited blood:         Genitourinary    Burning when urinating:     Blood in urine:        Psychiatric    Major depression:         Hematologic    Bleeding problems:    Problems with blood clotting too easily:        Skin    Rashes or ulcers:        Constitutional    Fever or chills:      PHYSICAL EXAM: Vitals:   12/12/18 1108  BP: (!) 166/83  Pulse: 97  Resp: 16  Temp: (!) 97.3 F (36.3 C)  TempSrc: Temporal  Weight: 162 lb (  73.5 kg)  Height: 5\' 5"  (1.651 m)    GENERAL: The patient is a well-nourished female, in no acute distress. The vital signs are documented above. CARDIAC: There is a regular rate and rhythm.  VASCULAR:  Palpable femoral pulses bilaterally No palpable pedal pulses No tissue loss lower extremity PULMONARY: There is good air exchange bilaterally without wheezing or rales. ABDOMEN: Soft and non-tender with normal pitched bowel sounds. PD catheter in place. MUSCULOSKELETAL: There are no major deformities or cyanosis. NEUROLOGIC: No focal weakness or paresthesias are detected. SKIN: There are no ulcers or rashes noted. PSYCHIATRIC: The patient has a normal affect.  DATA:   Lower extremity arterial duplex shows evidence of moderate bilateral SFA stenosis.  ABIs are 0.61 on the right and 0.64 on the left with monophasic waveform at the ankles.  Assessment/Plan:  48 year old female who presents for evaluation of peripheral vascular disease.  Discussed in detail with her that her symptoms sound more consistent with claudication  rather than critical limb ischemia.  That being said she feels that her symptoms are now lifestyle limiting and she canot go to the grocery store or do other things to live her life on a daily basis.  I discussed with her that claudication is not considered a limb threatening situation and we typically prefer conservative management.  Given the progression she feels this is lifestyle limiting and I offered her an arteriogram.  She feels the left leg is worse than the right, so discussed evaluating the left leg first.  Previous duplex suggests bilateral SFA disease with monophasic runoff at the ankles.  Discussed risks and benefits in detail.  I again clarified with her that treating her infrainguinal disease will not make her a transplant candidate and should have no bearing on her candidacy for transplant which she understands.  We will schedule for next available date.  [name], MD [facility] Office: [phone] Pager: [phone]

## 2018-12-13 ENCOUNTER — Encounter (INDEPENDENT_AMBULATORY_CARE_PROVIDER_SITE_OTHER): Payer: Medicare Other | Admitting: Ophthalmology

## 2018-12-14 ENCOUNTER — Other Ambulatory Visit: Payer: Self-pay

## 2018-12-14 ENCOUNTER — Encounter (HOSPITAL_COMMUNITY)
Admission: RE | Disposition: A | Discharge status: Home / Self Care | Payer: Self-pay | Source: Ambulatory Visit | Attending: Vascular Surgery

## 2018-12-14 ENCOUNTER — Ambulatory Visit (HOSPITAL_COMMUNITY)
Admission: RE | Admit: 2018-12-14 | Discharge: 2018-12-14 | Disposition: A | Discharge status: Home / Self Care | Payer: Medicare Other | Source: Ambulatory Visit | Attending: Vascular Surgery | Admitting: Vascular Surgery

## 2018-12-14 DIAGNOSIS — Z880 Allergy status to penicillin: Secondary | ICD-10-CM | POA: Insufficient documentation

## 2018-12-14 DIAGNOSIS — Z7951 Long term (current) use of inhaled steroids: Secondary | ICD-10-CM | POA: Insufficient documentation

## 2018-12-14 DIAGNOSIS — E1142 Type 2 diabetes mellitus with diabetic polyneuropathy: Secondary | ICD-10-CM | POA: Diagnosis not present

## 2018-12-14 DIAGNOSIS — J45909 Unspecified asthma, uncomplicated: Secondary | ICD-10-CM | POA: Diagnosis not present

## 2018-12-14 DIAGNOSIS — N184 Chronic kidney disease, stage 4 (severe): Secondary | ICD-10-CM | POA: Diagnosis not present

## 2018-12-14 DIAGNOSIS — Z86718 Personal history of other venous thrombosis and embolism: Secondary | ICD-10-CM | POA: Diagnosis not present

## 2018-12-14 DIAGNOSIS — Z79899 Other long term (current) drug therapy: Secondary | ICD-10-CM | POA: Insufficient documentation

## 2018-12-14 DIAGNOSIS — Z7982 Long term (current) use of aspirin: Secondary | ICD-10-CM | POA: Insufficient documentation

## 2018-12-14 DIAGNOSIS — E1122 Type 2 diabetes mellitus with diabetic chronic kidney disease: Secondary | ICD-10-CM | POA: Insufficient documentation

## 2018-12-14 DIAGNOSIS — I120 Hypertensive chronic kidney disease with stage 5 chronic kidney disease or end stage renal disease: Secondary | ICD-10-CM | POA: Diagnosis not present

## 2018-12-14 DIAGNOSIS — E1151 Type 2 diabetes mellitus with diabetic peripheral angiopathy without gangrene: Secondary | ICD-10-CM | POA: Diagnosis not present

## 2018-12-14 DIAGNOSIS — Z87891 Personal history of nicotine dependence: Secondary | ICD-10-CM | POA: Insufficient documentation

## 2018-12-14 DIAGNOSIS — K219 Gastro-esophageal reflux disease without esophagitis: Secondary | ICD-10-CM | POA: Diagnosis not present

## 2018-12-14 DIAGNOSIS — Z888 Allergy status to other drugs, medicaments and biological substances status: Secondary | ICD-10-CM | POA: Diagnosis not present

## 2018-12-14 DIAGNOSIS — Z794 Long term (current) use of insulin: Secondary | ICD-10-CM | POA: Diagnosis not present

## 2018-12-14 DIAGNOSIS — Z992 Dependence on renal dialysis: Secondary | ICD-10-CM | POA: Insufficient documentation

## 2018-12-14 DIAGNOSIS — I70213 Atherosclerosis of native arteries of extremities with intermittent claudication, bilateral legs: Secondary | ICD-10-CM | POA: Insufficient documentation

## 2018-12-14 DIAGNOSIS — E1136 Type 2 diabetes mellitus with diabetic cataract: Secondary | ICD-10-CM | POA: Diagnosis not present

## 2018-12-14 DIAGNOSIS — Z955 Presence of coronary angioplasty implant and graft: Secondary | ICD-10-CM | POA: Insufficient documentation

## 2018-12-14 DIAGNOSIS — I2510 Atherosclerotic heart disease of native coronary artery without angina pectoris: Secondary | ICD-10-CM | POA: Insufficient documentation

## 2018-12-14 DIAGNOSIS — K3184 Gastroparesis: Secondary | ICD-10-CM | POA: Diagnosis not present

## 2018-12-14 HISTORY — PX: ABDOMINAL AORTOGRAM W/LOWER EXTREMITY: CATH118223

## 2018-12-14 HISTORY — PX: PERIPHERAL VASCULAR INTERVENTION: CATH118257

## 2018-12-14 LAB — POCT I-STAT, CHEM 8
BUN: 53 mg/dL — ABNORMAL HIGH (ref 6–20)
Calcium, Ion: 1.07 mmol/L — ABNORMAL LOW (ref 1.15–1.40)
Chloride: 102 mmol/L (ref 98–111)
Creatinine, Ser: 9.5 mg/dL — ABNORMAL HIGH (ref 0.44–1.00)
Glucose, Bld: 145 mg/dL — ABNORMAL HIGH (ref 70–99)
HCT: 33 % — ABNORMAL LOW (ref 36.0–46.0)
Hemoglobin: 11.2 g/dL — ABNORMAL LOW (ref 12.0–15.0)
Potassium: 4 mmol/L (ref 3.5–5.1)
Sodium: 138 mmol/L (ref 135–145)
TCO2: 29 mmol/L (ref 22–32)

## 2018-12-14 LAB — GLUCOSE, CAPILLARY: Glucose-Capillary: 145 mg/dL — ABNORMAL HIGH (ref 70–99)

## 2018-12-14 SURGERY — ABDOMINAL AORTOGRAM W/LOWER EXTREMITY
Anesthesia: LOCAL | Laterality: Left

## 2018-12-14 MED ORDER — HEPARIN (PORCINE) IN NACL 1000-0.9 UT/500ML-% IV SOLN
INTRAVENOUS | Status: AC
  Filled 2018-12-14: qty 1000

## 2018-12-14 MED ORDER — ONDANSETRON HCL 4 MG/2ML IJ SOLN: 4.0000 mg | Freq: Four times a day (QID) | INTRAMUSCULAR | Status: DC | PRN

## 2018-12-14 MED ORDER — SODIUM CHLORIDE 0.9% FLUSH: 3.0000 mL | Freq: Two times a day (BID) | INTRAVENOUS | Status: DC

## 2018-12-14 MED ORDER — MIDAZOLAM HCL 2 MG/2ML IJ SOLN
INTRAMUSCULAR | Status: DC | PRN
  Administered 2018-12-14 (×2): 1 mg via INTRAVENOUS

## 2018-12-14 MED ORDER — HYDRALAZINE HCL 20 MG/ML IJ SOLN: 5.0000 mg | INTRAMUSCULAR | Status: DC | PRN

## 2018-12-14 MED ORDER — FENTANYL CITRATE (PF) 100 MCG/2ML IJ SOLN
INTRAMUSCULAR | Status: DC | PRN
  Administered 2018-12-14 (×2): 25 ug via INTRAVENOUS

## 2018-12-14 MED ORDER — LABETALOL HCL 5 MG/ML IV SOLN: 10.0000 mg | INTRAVENOUS | Status: DC | PRN

## 2018-12-14 MED ORDER — SODIUM CHLORIDE 0.9 % IV SOLN: 250.0000 mL | INTRAVENOUS | Status: DC | PRN

## 2018-12-14 MED ORDER — HEPARIN SODIUM (PORCINE) 1000 UNIT/ML IJ SOLN
INTRAMUSCULAR | Status: AC
  Filled 2018-12-14: qty 1

## 2018-12-14 MED ORDER — LIDOCAINE HCL (PF) 1 % IJ SOLN
INTRAMUSCULAR | Status: DC | PRN
  Administered 2018-12-14: 15 mL via INTRADERMAL

## 2018-12-14 MED ORDER — SODIUM CHLORIDE 0.9% FLUSH: 3.0000 mL | INTRAVENOUS | Status: DC | PRN

## 2018-12-14 MED ORDER — LIDOCAINE HCL (PF) 1 % IJ SOLN
INTRAMUSCULAR | Status: AC
  Filled 2018-12-14: qty 30

## 2018-12-14 MED ORDER — CLOPIDOGREL BISULFATE 300 MG PO TABS
300.0000 mg | ORAL_TABLET | Freq: Once | ORAL | Status: AC
  Administered 2018-12-14: 300 mg via ORAL

## 2018-12-14 MED ORDER — FENTANYL CITRATE (PF) 100 MCG/2ML IJ SOLN
INTRAMUSCULAR | Status: AC
  Filled 2018-12-14: qty 2

## 2018-12-14 MED ORDER — HEPARIN SODIUM (PORCINE) 1000 UNIT/ML IJ SOLN
INTRAMUSCULAR | Status: DC | PRN
  Administered 2018-12-14: 8000 [IU] via INTRAVENOUS

## 2018-12-14 MED ORDER — HEPARIN (PORCINE) IN NACL 1000-0.9 UT/500ML-% IV SOLN
INTRAVENOUS | Status: DC | PRN
  Administered 2018-12-14 (×2): 500 mL

## 2018-12-14 MED ORDER — MIDAZOLAM HCL 2 MG/2ML IJ SOLN
INTRAMUSCULAR | Status: AC
  Filled 2018-12-14: qty 2

## 2018-12-14 MED ORDER — ACETAMINOPHEN 325 MG PO TABS: 650.0000 mg | ORAL_TABLET | ORAL | Status: DC | PRN

## 2018-12-14 SURGICAL SUPPLY — 18 items
BALLN MUSTANG 4X150X135 (BALLOONS) ×3
BALLOON MUSTANG 4X150X135 (BALLOONS) ×1 IMPLANT
CATH OMNI FLUSH 5F 65CM (CATHETERS) ×1 IMPLANT
CATH QUICKCROSS SUPP .035X90CM (MICROCATHETER) ×1 IMPLANT
CLOSURE MYNX CONTROL 6F/7F (Vascular Products) ×1 IMPLANT
GLIDEWIRE ADV .035X260CM (WIRE) ×2 IMPLANT
KIT ENCORE 26 ADVANTAGE (KITS) ×1 IMPLANT
KIT MICROPUNCTURE NIT STIFF (SHEATH) ×2 IMPLANT
KIT PV (KITS) ×3 IMPLANT
SHEATH FLEX ANSEL ANG 6F 45CM (SHEATH) ×1 IMPLANT
SHEATH PINNACLE 5F 10CM (SHEATH) ×2 IMPLANT
SHEATH PINNACLE 6F 10CM (SHEATH) ×1 IMPLANT
SHEATH PROBE COVER 6X72 (BAG) ×1 IMPLANT
STENT INNOVA 5X150X130 (Permanent Stent) ×2 IMPLANT
SYR MEDRAD MARK V 150ML (SYRINGE) ×1 IMPLANT
TRANSDUCER W/STOPCOCK (MISCELLANEOUS) ×3 IMPLANT
TRAY PV CATH (CUSTOM PROCEDURE TRAY) ×3 IMPLANT
WIRE BENTSON .035X145CM (WIRE) ×1 IMPLANT

## 2018-12-14 NOTE — H&P (Signed)
History and Physical Interval Note:  12/14/2018 7:41 AM  Susan Horton  has presented today for surgery, with the diagnosis of Peripherial vascular disease.  The various methods of treatment have been discussed with the patient and family. After consideration of risks, benefits and other options for treatment, the patient has consented to  Procedure(s): ABDOMINAL AORTOGRAM W/LOWER EXTREMITY (Bilateral) as a surgical intervention.  The patient's history has been reviewed, patient examined, no change in status, stable for surgery.  I have reviewed the patient's chart and labs.  Questions were answered to the patient's satisfaction.    Aortogram, bilateral lower extremity arteriogram.   [name]  Patient name: Susan Horton MRN: [id number] DOB: Apr 29, 1970 Sex: female  REASON FOR CONSULT: Follow-up for PAD  HPI:  Susan Horton is a 48 y.o. female, with multiple medical problems including end-stage renal disease as well as diabetes and severe peripheral neuropathy who presents for ongoing follow-up of her lower extremity peripheral vascular disease. She was initially evaluated in September and was under the impression that she was referred here in order to make her a transplant candidate for kidney transplant after evaluation in [place]. I reached out to Dr. Jimmy Footman and ultimately his message indicated that she had been declined for transplant in [place].  She feels like her legs are worse. She states she can hardly walk from here to the car without burning in the calves bilaterally and feeling like her legs are going to give out. She thinks the left leg is slightly worse than the right. This is also in the setting of chronic neuropathy in both feet that has been ongoing for years. No previous lower extremity interventions. States she quit smoking last year.      Past Medical History:  Diagnosis Date  . Anemia   . Angio-edema   . Asthma   . CAD (coronary artery disease)    DES to mid  LAD July 2018, residual moderate RCA disease  . Cataract   . CKD (chronic kidney disease) stage 4, GFR 15-29 ml/min (HCC)    Dialysis T/Th/Sa  . DVT (deep venous thrombosis) ([place])    1996 during pregnancy, 2015 left leg  . Eczema   . Essential hypertension 12/11/2015  . Gastroparesis   . GERD (gastroesophageal reflux disease)   . Hidradenitis   . Migraine   . Neuropathy   . Peripheral vascular disease ([place])    blood clot in leg  . Type 2 diabetes mellitus ([place])   . Type II diabetes mellitus ([place])   . Urticaria         Past Surgical History:  Procedure Laterality Date  . A/V FISTULAGRAM N/A 06/22/2017   Procedure: A/V FISTULAGRAM - left arm; Surgeon: [name], MD; Location: [facility]; Service: Cardiovascular; Laterality: N/A;  . ABDOMINOPLASTY    . ADENOIDECTOMY    . APPENDECTOMY  1995  . AV FISTULA PLACEMENT Left 02/01/2017   Procedure: ARTERIOVENOUS BRACHIOCEPHALIC (AV) FISTULA CREATION; Surgeon: [name], MD; Location: [facility]; Service: Vascular; Laterality: Left;  . CHOLECYSTECTOMY  1993  . CORONARY ANGIOPLASTY WITH STENT PLACEMENT    . CORONARY STENT INTERVENTION N/A 08/05/2016   Procedure: Coronary Stent Intervention; Surgeon: [name], MD; Location: [facility]; Service: Cardiovascular; Laterality: N/A;  . EXPLORATORY LAPAROTOMY  08/14/2005   lysis of adhesions, drainage of tubo-ovarian abscess  . FISTULA SUPERFICIALIZATION Left 09/14/2017   Procedure: FISTULA SUPERFICIALIZATION ARTERIOVENOUS FISTULA LEFT ARM; Surgeon: [name],  MD; Location: [facility]; Service: Vascular; Laterality: Left;  . HYDRADENITIS EXCISION  02/08/2011   Procedure: EXCISION HYDRADENITIS GROIN; Surgeon: [name], MD; Location: [facility]; Service: General; Laterality: N/A; Excisioin of Hidradenitis Left groin  . INGUINAL HIDRADENITIS EXCISION  07/06/2010   bilateral  . INSERTION OF DIALYSIS CATHETER N/A 09/14/2017   Procedure:  INSERTION OF TUNNELED DIALYSIS CATHETER Right Internal Jugular; Surgeon: [name], MD; Location: [facility]; Service: Vascular; Laterality: N/A;  . LEFT HEART CATH AND CORONARY ANGIOGRAPHY N/A 08/05/2016   Procedure: Left Heart Cath and Coronary Angiography; Surgeon: [name], MD; Location: [facility]; Service: Cardiovascular; Laterality: N/A;  . REDUCTION MAMMAPLASTY  2002  . TONSILLECTOMY    . TUBAL LIGATION  1996  . VAGINAL HYSTERECTOMY  08/04/2005   and cysto        Family History  Problem Relation Age of Onset  . Allergic rhinitis Mother   . Hypertension Father   . Allergic rhinitis Father   . Hypertension Sister   . Allergic rhinitis Sister   . Hypertension Brother   . Allergic rhinitis Brother   . Breast cancer Cousin    x2   SOCIAL HISTORY:  Social History        Socioeconomic History  . Marital status: Single    Spouse name: Not on file  . Number of children: 3  . Years of education: 53  . Highest education level: Not on file  Occupational History  . Occupation: Curator  Social Needs  . Financial resource strain: Not on file  . Food insecurity    Worry: Not on file    Inability: Not on file  . Transportation needs    Medical: Not on file    Non-medical: Not on file  Tobacco Use  . Smoking status: Former Smoker    Years: 20.00    Types: Cigarettes    Quit date: 11/2016    Years since quitting: 2.0  . Smokeless tobacco: Never Used  Substance and Sexual Activity  . Alcohol use: No    Alcohol/week: 0.0 standard drinks  . Drug use: No  . Sexual activity: Not on file  Lifestyle  . Physical activity    Days per week: Not on file    Minutes per session: Not on file  . Stress: Not on file  Relationships  . Social Herbalist on phone: Not on file    Gets together: Not on file    Attends religious service: Not on file    Active member of club or organization: Not on file    Attends meetings of clubs or  organizations: Not on file    Relationship status: Not on file  . Intimate partner violence    Fear of current or ex partner: Not on file    Emotionally abused: Not on file    Physically abused: Not on file    Forced sexual activity: Not on file  Other Topics Concern  . Not on file  Social History Narrative   Lives with daughter in a 2 story home. Has 3 children. 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 Education: college.         Allergies  Allergen Reactions  . Penicillins Anaphylaxis, Itching, Swelling, Rash and Other (See Comments)    Swelling of throat & whole mouth  Has patient had a PCN reaction causing immediate rash, facial/tongue/throat swelling, SOB or lightheadedness with hypotension: Yes  Has patient had a PCN reaction causing severe rash involving mucus membranes or skin necrosis: No  Has patient had a PCN reaction that required hospitalization: No  Has patient had a PCN reaction occurring within the last 10 years: No  If all of the above answers are "NO", then may proceed with Cephalosporin use.  Swelling of throat & whole mouth -angioedema  Has patient had a PCN reaction causing immediate rash, facial/tongue/throat swelling, SOB or lightheadedness with hypotension: Yes  Has patient had a PCN reaction causing severe rash involving mucus membranes or skin necrosis: No  Has patient had a PCN reaction that required hospitalization: No  Has patient had a PCN reaction occurring within the last 10 years: No  If all of the above answers are "NO", then may proceed with Cephalosporin use.  Swelling of th  Swelling of throat & whole mouth  Has patient had a PCN reaction causing immediate rash, facial/tongue/throat swelling, SOB or lightheadedness with hypotension: Yes  Has patient had a PCN reaction causing severe rash involving mucus membranes or skin necrosis: No  Has patient had a PCN reaction that required hospitalization: No  Has patient had a PCN  reaction occurring within the last 10 years: No  If all of the above answers are "NO", then may proceed with Cephalosporin use.  Swelling of tongue itching and rash   . Repatha [Evolocumab] Itching  . Lisinopril Cough         Current Outpatient Medications  Medication Sig Dispense Refill  . albuterol (PROVENTIL HFA;VENTOLIN HFA) 108 (90 BASE) MCG/ACT inhaler Inhale 1-2 puffs into the lungs every 6 (six) hours as needed for wheezing or shortness of breath.     [name] amitriptyline (ELAVIL) 10 MG tablet Take 1 tablet (10 mg total) by mouth at bedtime. 30 tablet 1  . aspirin 81 MG chewable tablet Chew 1 tablet (81 mg total) by mouth daily. 30 tablet 0  . calcitRIOL (ROCALTROL) 0.5 MCG capsule Take 1.5 mcg by mouth daily.    . calcium acetate (PHOSLO) 667 MG capsule Take 1,334-2,001 mg by mouth See admin instructions. Take three capsules (2001 mg) by mouth up to three times daily with meals and two capsules (1334 mg) with snacks  10  . clopidogrel (PLAVIX) 75 MG tablet Take 1 tablet (75 mg total) by mouth daily with breakfast. 90 tablet 3  . Ferrous Sulfate (IRON) 325 (65 Fe) MG TABS Take 325 mg by mouth daily. 30 each 3  . Fluticasone-Salmeterol (ADVAIR) 250-50 MCG/DOSE AEPB Inhale 1 puff into the lungs 2 (two) times daily. (Patient taking differently: Inhale 1 puff into the lungs daily as needed (shortness of breath). ) 60 each 3  . gentamicin cream (GARAMYCIN) 0.1 % Apply 1 application topically See admin instructions. Apply to access site nightly    . Insulin Glargine, 1 Unit Dial, (TOUJEO SOLOSTAR) 300 UNIT/ML SOPN Inject 25 Units into the skin 2 (two) times a day. (Patient taking differently: Inject 50 Units into the skin 2 (two) times a day. ) 12 pen 3  . insulin regular (HUMULIN R) 100 units/mL injection Inject 0.2-0.25 mLs (20-25 Units total) into the skin See admin instructions. (Patient taking differently: Inject 7-15 Units into the skin 3 (three) times daily before meals. Per CBG) 45 mL 3  .  lidocaine-prilocaine (EMLA) cream Apply 1 application topically See admin instructions. Apply small amount to access site (AVF) 30 minutes before hemodialysis, cover with occlusive dressing (Saran Wrap) if going out to dialysis center  5  . lovastatin (MEVACOR) 20 MG tablet Take 20 mg by mouth at bedtime.     . methocarbamol (ROBAXIN) 500 MG tablet Take 1 tablet (500 mg total) by mouth 2 (two) times daily as needed for muscle spasms. 60 tablet 1  . metoCLOPramide (REGLAN) 5 MG tablet TAKE 1 TABLET BY MOUTH 4 TIMES DAILY BEFORE MEALS AND AT BEDTIME. (Patient taking differently: Take 5 mg by mouth 4 (four) times daily - before meals and at bedtime. ) 90 tablet 0  . metoprolol succinate (TOPROL-XL) 50 MG 24 hr tablet Take 1 tablet (50 mg total) by mouth daily. 90 tablet 3  . montelukast (SINGULAIR) 10 MG tablet Take 1 tablet (10 mg total) by mouth at bedtime. (Patient taking differently: Take 10 mg by mouth at bedtime as needed (allergies). ) 30 tablet 5  . multivitamin (RENA-VIT) TABS tablet Take 1 tablet by mouth at bedtime.  0  . mupirocin cream (BACTROBAN) 2 % Apply 1 application topically 2 (two) times daily as needed. To affected areas and cover with non-stick gauze. 15 g 0  . nitroGLYCERIN (NITROSTAT) 0.4 MG SL tablet Place 1 tablet (0.4 mg total) under the tongue every 5 (five) minutes as needed for chest pain. 25 tablet 2  . pantoprazole (PROTONIX) 40 MG tablet TAKE 1 TABLET BY MOUTH DAILY. (Patient taking differently: Take 40 mg by mouth daily. ) 30 tablet 7  . pregabalin (LYRICA) 75 MG capsule Take 1 capsule (75 mg total) by mouth daily. 30 capsule 1  . senna-docusate (SENOKOT-S) 8.6-50 MG tablet Take 1 tablet by mouth 2 (two) times daily. (Patient taking differently: Take 1 tablet by mouth daily as needed for moderate constipation. )    . traMADol (ULTRAM) 50 MG tablet Take 1 tablet (50 mg total) by mouth every 12 (twelve) hours as needed for severe pain. 10 tablet 0  . trimethoprim-polymyxin b  (POLYTRIM) ophthalmic solution Place 1 drop into both eyes See admin instructions. Instill one drop into both eyes four times daily for 2 days after each monthly eye injection  12  . isosorbide mononitrate (IMDUR) 30 MG 24 hr tablet Take 0.5 tablets (15 mg total) by mouth daily. 45 tablet 3   No current facility-administered medications for this visit.    REVIEW OF SYSTEMS:  [X]  denotes positive finding, [ ]  denotes negative finding  Cardiac  Comments:  Chest pain or chest pressure:    Shortness of breath upon exertion:    Short of breath when lying flat:    Irregular heart rhythm:        Vascular    Pain in calf, thigh, or hip brought on by ambulation: x   Pain in feet at night that wakes you up from your sleep:  x   Blood clot in your veins:    Leg swelling:         Pulmonary    Oxygen at home:    Productive cough:     Wheezing:         Neurologic    Sudden weakness in arms or legs:     Sudden numbness in arms or legs:     Sudden onset of difficulty speaking or slurred speech:    Temporary loss of vision in one eye:     Problems with dizziness:         Gastrointestinal    Blood in stool:     Vomited blood:         Genitourinary  Burning when urinating:     Blood in urine:        Psychiatric    Major depression:         Hematologic    Bleeding problems:    Problems with blood clotting too easily:        Skin    Rashes or ulcers:        Constitutional    Fever or chills:    PHYSICAL EXAM:     Vitals:   12/12/18 1108  BP: (!) 166/83  Pulse: 97  Resp: 16  Temp: (!) 97.3 F (36.3 C)  TempSrc: Temporal  Weight: 162 lb (73.5 kg)  Height: 5\' 5"  (1.651 m)   GENERAL: The patient is a well-nourished female, in no acute distress. The vital signs are documented above.  CARDIAC: There is a regular rate and rhythm.  VASCULAR:  Palpable femoral pulses bilaterally  No palpable pedal pulses  No tissue loss lower extremity  PULMONARY: There is good air exchange  bilaterally without wheezing or rales.  ABDOMEN: Soft and non-tender with normal pitched bowel sounds. PD catheter in place.  MUSCULOSKELETAL: There are no major deformities or cyanosis.  NEUROLOGIC: No focal weakness or paresthesias are detected.  SKIN: There are no ulcers or rashes noted.  PSYCHIATRIC: The patient has a normal affect.  DATA:  Lower extremity arterial duplex shows evidence of moderate bilateral SFA stenosis.  ABIs are 0.61 on the right and 0.64 on the left with monophasic waveform at the ankles.  Assessment/Plan:  48 year old female who presents for evaluation of peripheral vascular disease. Discussed in detail with her that her symptoms sound more consistent with claudication rather than critical limb ischemia. That being said she feels that her symptoms are now lifestyle limiting and she canot go to the grocery store or do other things to live her life on a daily basis. I discussed with her that claudication is not considered a limb threatening situation and we typically prefer conservative management. Given the progression she feels this is lifestyle limiting and I offered her an arteriogram. She feels the left leg is worse than the right, so discussed evaluating the left leg first. Previous duplex suggests bilateral SFA disease with monophasic runoff at the ankles. Discussed risks and benefits in detail.  I again clarified with her that treating her infrainguinal disease will not make her a transplant candidate and should have no bearing on her candidacy for transplant which she understands. We will schedule for next available date.  [name], MD  [facility]  Office: [phone]  Pager: [phone]

## 2018-12-14 NOTE — Discharge Instructions (Signed)

## 2018-12-14 NOTE — Op Note (Signed)
Patient name: Susan Horton MRN: [id number] DOB: [birth year] Sex: female  12/14/2018 Pre-operative Diagnosis: Bilateral lower extremity lifestyle limiting short distance claudication Post-operative diagnosis:  Same Surgeon:  [name], MD Procedure Performed: 1.  Ultrasound-guided access of the right common femoral artery 2.  Aortogram with bilateral lower extremity arteriogram 3.  Left SFA angioplasty (4 mm x 150 mm Mustang) 4.  Left SFA stent placement (5 mm x 150 mm self-expanding Innova) 5.  49 minutes of monitored moderate conscious sedation time 6.  Mynx closure of the right common femoral artery  Indications: Patient is a 47 year old female that was seen in clinic as a referral from nephrology for evaluation of lower extremity arterial disease.  Ultimately her symptoms were consistent with claudication progressing to short distance lifestyle limiting claudication and noninvasive imaging showed evidence of SFA disease with monophasic runoff at the ankles in both lower extremities.  Attempted to recommend conservative management initially but patient stated that her symptoms are progressing.  She presents today for left lower extremity intervention after risk and benefits were discussed.  Findings:   Aortogram showed a fairly small infrarenal aorta that was moderately diseased but no overt flow-limiting stenosis.  The right common and external iliac are widely patent although small.  The left common iliac is widely patent and the left external iliac has a proximal 50% stenosis.  I elected not to treat the left iliac given excellent left femoral pulse.  The left lower extremity arteriogram showed a patent common femoral and profunda.  She has a fairly diseased SFA in the mid segment with approximately 150 mm segment with greater than 90 to 95% stenosis.  Patient had a patent above and below-knee popliteal artery.  She has dominant runoff in the left lower extremity via the  posterior tibial into the ankle.  The anterior tibial and peroneal were patent proximal but appeared to occlude in the distal calf.  There was reconstitution of a very diminutive dorsalis pedis.  Right lower extremity arteriogram shows a patent common femoral and profunda and again a fairly long diseased segment of SFA with flow-limiting stenosis.  She has a patent above and below-knee popliteal artery.  Dominant runoff in the right lower extremity appears to be the posterior tibial.   Procedure:  The patient was identified in the holding area and taken to room 8.  The patient was then placed supine on the table and prepped and draped in the usual sterile fashion.  A time out was called.  Ultrasound was used to evaluate the right common femoral artery.  It was patent .  A digital ultrasound image was acquired.  A micropuncture needle was used to access the right common femoral artery under ultrasound guidance.  An 018 wire was advanced without resistance and a micropuncture sheath was placed.  The 018 wire was removed and a benson wire was placed.  The micropuncture sheath was exchanged for a 5 french sheath.  An omniflush catheter was advanced over the wire to the level of L-1.  An abdominal angiogram was obtained.  Next the catheter was pulled down and bilateral lower extremity runoff was obtained.  Pertinent findings are noted above.  Given plans to intervene on the left leg first since it was more symptomatic, I used a Glidewire advantage to cross the aortic bifurcation with a Omni Flush catheter and selected the left SFA.  I then upsized to a long 6 [place] Ansell sheath in the right common femoral artery over  the aortic bifurcation and the distal sheath was parked in the left SFA.  Patient was given 100 units/kg heparin.  I then used a Glidewire advantage with a quick cross catheter to cross the SFA lesion into the above-knee popliteal artery.  We did perform hand-injection through the quick cross to  confirm we were in the true lumen.  We got additional planning shot from the sheath.  Ultimately selected a 4 mm x 150 mm Mustang to angioplasty the mid SFA lesion throughout its segment.  The balloon was inflated to nominal pressure for 2 minutes.  We shot another arteriogram through the sheath and there was a dissection.  I felt like this had to be stented given the extent of dissection and did not feel like a drug-coated balloon would be acceptable.  I did select a 5 mm x 150 mm self-expanding Innova.  This was placed across the SFA diseased segment where we had angioplastied.  Once the stent was deployed I then used a 4 mm Mustang and overinflated this to 24 mmHg to get full expansion of the stent.  One final injection showed inline flow down the SFA with no residual stenosis.  There was preserved runoff in the lower extremity with patent trifurcation and dominant runoff into the foot via posterior tibial artery.  That point time wires and catheters were removed and exchanged for a short 6 French sheath in the right groin.  Mynx closure device was deployed and additional pressure was held for 5 minutes.  Patient was taken to PACU in stable condition.     [name], MD [facility] Office: [phone] Pager: [phone]

## 2018-12-15 ENCOUNTER — Encounter: Payer: Self-pay | Admitting: Cardiovascular Disease

## 2018-12-15 ENCOUNTER — Encounter (HOSPITAL_COMMUNITY): Payer: Self-pay | Admitting: Vascular Surgery

## 2018-12-15 ENCOUNTER — Ambulatory Visit (INDEPENDENT_AMBULATORY_CARE_PROVIDER_SITE_OTHER): Payer: Medicare Other | Admitting: Cardiovascular Disease

## 2018-12-15 VITALS — BP 142/68 | HR 100 | Temp 97.2°F | Ht 65.0 in | Wt 163.8 lb

## 2018-12-15 DIAGNOSIS — F17200 Nicotine dependence, unspecified, uncomplicated: Secondary | ICD-10-CM

## 2018-12-15 DIAGNOSIS — I409 Acute myocarditis, unspecified: Secondary | ICD-10-CM

## 2018-12-15 DIAGNOSIS — I10 Essential (primary) hypertension: Secondary | ICD-10-CM

## 2018-12-15 DIAGNOSIS — I739 Peripheral vascular disease, unspecified: Secondary | ICD-10-CM

## 2018-12-15 DIAGNOSIS — E785 Hyperlipidemia, unspecified: Secondary | ICD-10-CM

## 2018-12-15 DIAGNOSIS — N186 End stage renal disease: Secondary | ICD-10-CM | POA: Diagnosis not present

## 2018-12-15 DIAGNOSIS — R0602 Shortness of breath: Secondary | ICD-10-CM

## 2018-12-15 DIAGNOSIS — I6522 Occlusion and stenosis of left carotid artery: Secondary | ICD-10-CM

## 2018-12-15 DIAGNOSIS — R0989 Other specified symptoms and signs involving the circulatory and respiratory systems: Secondary | ICD-10-CM

## 2018-12-15 DIAGNOSIS — Z992 Dependence on renal dialysis: Secondary | ICD-10-CM

## 2018-12-15 NOTE — Assessment & Plan Note (Signed)
History of CAD status post cardiac catheterization 08/05/2016 via femoral approach after an intermediate risk Myoview showing inferior ischemia.  She had 80% mid LAD lesion which I stented using a synergy drug-eluting stent as well as moderate mid RCA stenosis which was not addressed.  She also had left main spasm improving with intracoronary nitroglycerin.  She denies chest pain but has developed increasing dyspnea on exertion over the last month.  I am to get a 2D echo and a month Lexiscan Myoview stress test to further evaluate.

## 2018-12-15 NOTE — Assessment & Plan Note (Signed)
History of mild to moderate left ICA stenosis by Doppler study 07/25/2018.  We will repeat in 1 year.

## 2018-12-15 NOTE — Assessment & Plan Note (Signed)
Currently on peritoneal dialysis.  She is exploring renal transplantation.

## 2018-12-15 NOTE — Assessment & Plan Note (Signed)
History of peripheral arterial disease status post right SFA intervention by Dr. Carlis Abbott 12/14/2018.

## 2018-12-15 NOTE — Assessment & Plan Note (Signed)
History of essential hypertension blood pressure measured today 142/68.  She is on metoprolol.

## 2018-12-15 NOTE — Progress Notes (Signed)
12/15/2018 Susan Horton   1970/06/03  [id number]  Primary Physician Charlott Rakes, MD Primary Cardiologist: [name] MD [name], [place]  HPI:  Susan Horton is a 48 y.o.   moderately overweight single African-American female mother of 18, grandmother and 4 grandchildren whoI last saw in the office  03/14/2018.She has a history of treated hypertension, hyperlipidemia and diabetes which she's had for the last 22 years. She has CKD 3 with creatinine running in the mid 3 range followed by Dr. Jimmy Footman. She's noticed chest pain over the last 3-4 months occurring several times a week lasting minutes at a time worse with exertion. She had a Myoview stress test was read as intermediate risk with inferior ischemia. Based on this, I decided to proceed with outpatient radial diagnostic coronary angiography using limited contrast. This was performed 08/05/16 the right femoral approach revealed an 80% mid LAD which was stented using a synergy drug-eluting stent reducing 80% stenosis to 0% residual. She did have moderate mid RCA stenosis which was not addressed. She had left main spasm probably related to the guide catheter doing a procedure that responded to intra-coronary nitroglycerin. Her chest pain has resolved and she has stopped smoking since. She did have carotid Dopplers performed because of bruits that showed mild to moderate left ICA stenosis which will be followed on an annual basis. Since I saw her 4 months ago she has done well. She gets occasional chest pain during hemodialysis. She recently had a peritoneal dialysis placed yesterday and is planning on transitioning to home peritoneal dialysis. We have also talked about going on the kidney transplant list although her hemoglobin A1c is still elevated in the 11 range. She has stopped smoking. She was recently begun on Repatha for resistant hyperlipidemia but unfortunately there was a lapse in her insurance last several months.   She will restart this.  Since I saw her 9 months ago she is remained stable.  She is tolerating peritoneal dialysis without difficulty.    She did have right SFA intervention yesterday by Dr. Carlis Abbott.  She tells me that over the last several months she is noticed increasing dyspnea on exertion but denies chest pain.  Current Meds  Medication Sig  . albuterol (PROVENTIL HFA;VENTOLIN HFA) 108 (90 BASE) MCG/ACT inhaler Inhale 1-2 puffs into the lungs every 6 (six) hours as needed for wheezing or shortness of breath.   [name] amitriptyline (ELAVIL) 10 MG tablet Take 1 tablet (10 mg total) by mouth at bedtime.  [name] aspirin 81 MG chewable tablet Chew 1 tablet (81 mg total) by mouth daily.  . B Complex-C-Zn-Folic Acid (DIALYVITE 357 WITH ZINC) 0.8 MG TABS Take 1 tablet by mouth daily.  . calcitRIOL (ROCALTROL) 0.5 MCG capsule Take 1.5 mcg by mouth daily.  . calcium acetate (PHOSLO) 667 MG capsule Take 1,334-2,001 mg by mouth See admin instructions. Take three capsules (2001 mg) by mouth up to three times daily with meals and two capsules (1334 mg) with snacks  . clopidogrel (PLAVIX) 75 MG tablet Take 1 tablet (75 mg total) by mouth daily with breakfast.  . Ferrous Sulfate (IRON) 325 (65 Fe) MG TABS Take 325 mg by mouth daily.  . Fluticasone-Salmeterol (ADVAIR) 250-50 MCG/DOSE AEPB Inhale 1 puff into the lungs 2 (two) times daily. (Patient taking differently: Inhale 1 puff into the lungs daily as needed (shortness of breath). )  . gentamicin cream (GARAMYCIN) 0.1 % Apply 1 application topically See admin instructions. Apply to  access site nightly  . Insulin Glargine, 1 Unit Dial, (TOUJEO SOLOSTAR) 300 UNIT/ML SOPN Inject 25 Units into the skin 2 (two) times a day. (Patient taking differently: Inject 50 Units into the skin 2 (two) times a day. )  . insulin regular (HUMULIN R) 100 units/mL injection Inject 0.2-0.25 mLs (20-25 Units total) into the skin See admin instructions. (Patient taking differently: Inject 7-15  Units into the skin 3 (three) times daily before meals. Per CBG)  . lidocaine-prilocaine (EMLA) cream Apply 1 application topically See admin instructions. Apply small amount to access site (AVF) 30 minutes before hemodialysis, cover with occlusive dressing (Saran Wrap) if going out to dialysis center  . lovastatin (MEVACOR) 20 MG tablet Take 20 mg by mouth at bedtime.   . methocarbamol (ROBAXIN) 500 MG tablet Take 1 tablet (500 mg total) by mouth 2 (two) times daily as needed for muscle spasms.  . metoCLOPramide (REGLAN) 5 MG tablet TAKE 1 TABLET BY MOUTH 4 TIMES DAILY BEFORE MEALS AND AT BEDTIME. (Patient taking differently: Take 5 mg by mouth 4 (four) times daily -  before meals and at bedtime. )  . metoprolol succinate (TOPROL-XL) 50 MG 24 hr tablet Take 1 tablet (50 mg total) by mouth daily.  . montelukast (SINGULAIR) 10 MG tablet Take 1 tablet (10 mg total) by mouth at bedtime. (Patient taking differently: Take 10 mg by mouth at bedtime as needed (allergies). )  . mupirocin cream (BACTROBAN) 2 % Apply 1 application topically 2 (two) times daily as needed. To affected areas and cover with non-stick gauze.  . nitroGLYCERIN (NITROSTAT) 0.4 MG SL tablet Place 1 tablet (0.4 mg total) under the tongue every 5 (five) minutes as needed for chest pain.  . pantoprazole (PROTONIX) 40 MG tablet TAKE 1 TABLET BY MOUTH DAILY. (Patient taking differently: Take 40 mg by mouth daily. )  . pregabalin (LYRICA) 75 MG capsule Take 1 capsule (75 mg total) by mouth daily.  [name] senna-docusate (SENOKOT-S) 8.6-50 MG tablet Take 1 tablet by mouth 2 (two) times daily. (Patient taking differently: Take 1 tablet by mouth daily as needed for moderate constipation. )  . traMADol (ULTRAM) 50 MG tablet Take 1 tablet (50 mg total) by mouth every 12 (twelve) hours as needed for severe pain.  [name] trimethoprim-polymyxin b (POLYTRIM) ophthalmic solution Place 1 drop into both eyes See admin instructions. Instill one drop into both eyes  four times daily for 2 days after each monthly eye injection     Allergies  Allergen Reactions  . Penicillins Anaphylaxis, Itching, Swelling, Rash and Other (See Comments)    Swelling of throat & whole mouth  Has patient had a PCN reaction causing immediate rash, facial/tongue/throat swelling, SOB or lightheadedness with hypotension: Yes Has patient had a PCN reaction causing severe rash involving mucus membranes or skin necrosis: No Has patient had a PCN reaction that required hospitalization: No Has patient had a PCN reaction occurring within the last 10 years: No If all of the above answers are "NO", then may proceed with Cephalosporin use. Swelling of throat & whole mouth -angioedema Has patient had a PCN reaction causing immediate rash, facial/tongue/throat swelling, SOB or lightheadedness with hypotension: Yes Has patient had a PCN reaction causing severe rash involving mucus membranes or skin necrosis: No Has patient had a PCN reaction that required hospitalization: No Has patient had a PCN reaction occurring within the last 10 years: No If all of the above answers are "NO", then may proceed with Cephalosporin use. Swelling  of th Swelling of throat & whole mouth  Has patient had a PCN reaction causing immediate rash, facial/tongue/throat swelling, SOB or lightheadedness with hypotension: Yes Has patient had a PCN reaction causing severe rash involving mucus membranes or skin necrosis: No Has patient had a PCN reaction that required hospitalization: No Has patient had a PCN reaction occurring within the last 10 years: No If all of the above answers are "NO", then may proceed with Cephalosporin use. Swelling of tongue itching and rash   . Repatha [Evolocumab] Itching  . Lisinopril Cough    Social History   Socioeconomic History  . Marital status: Single    Spouse name: Not on file  . Number of children: 3  . Years of education: 6  . Highest education level: Not on file   Occupational History  . Occupation: Curator  Social Needs  . Financial resource strain: Not on file  . Food insecurity    Worry: Not on file    Inability: Not on file  . Transportation needs    Medical: Not on file    Non-medical: Not on file  Tobacco Use  . Smoking status: Former Smoker    Years: 20.00    Types: Cigarettes    Quit date: 11/2016    Years since quitting: 2.0  . Smokeless tobacco: Never Used  Substance and Sexual Activity  . Alcohol use: No    Alcohol/week: 0.0 standard drinks  . Drug use: No  . Sexual activity: Not on file  Lifestyle  . Physical activity    Days per week: Not on file    Minutes per session: Not on file  . Stress: Not on file  Relationships  . Social Herbalist on phone: Not on file    Gets together: Not on file    Attends religious service: Not on file    Active member of club or organization: Not on file    Attends meetings of clubs or organizations: Not on file    Relationship status: Not on file  . Intimate partner violence    Fear of current or ex partner: Not on file    Emotionally abused: Not on file    Physically abused: Not on file    Forced sexual activity: Not on file  Other Topics Concern  . Not on file  Social History Narrative   Lives with daughter in a 2 story home.  Has 3 children.  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  Education: college.      Review of Systems: General: negative for chills, fever, night sweats or weight changes.  Cardiovascular: negative for chest pain, dyspnea on exertion, edema, orthopnea, palpitations, paroxysmal nocturnal dyspnea or shortness of breath Dermatological: negative for rash Respiratory: negative for cough or wheezing Urologic: negative for hematuria Abdominal: negative for nausea, vomiting, diarrhea, bright red blood per rectum, melena, or hematemesis Neurologic: negative for visual changes, syncope, or dizziness All other systems  reviewed and are otherwise negative except as noted above.    Blood pressure (!) 142/68, pulse 100, temperature (!) 97.2 F (36.2 C), height 5\' 5"  (1.651 m), weight 163 lb 12.8 oz (74.3 kg), SpO2 99 %.  General appearance: alert and no distress Neck: no adenopathy, no JVD, supple, symmetrical, trachea midline, thyroid not enlarged, symmetric, no tenderness/mass/nodules and Soft bilateral carotid bruits Lungs: clear to auscultation bilaterally Heart: regular rate and rhythm, S1, S2 normal, no murmur, click, rub or gallop Extremities:  extremities normal, atraumatic, no cyanosis or edema Pulses: Diminished pedal pulses Skin: Skin color, texture, turgor normal. No rashes or lesions Neurologic: Alert and oriented X 3, normal strength and tone. Normal symmetric reflexes. Normal coordination and gait  EKG not performed today  ASSESSMENT AND PLAN:   CAD S/P mLAD PCI with DES History of CAD status post cardiac catheterization 08/05/2016 via femoral approach after an intermediate risk Myoview showing inferior ischemia.  She had 80% mid LAD lesion which I stented using a synergy drug-eluting stent as well as moderate mid RCA stenosis which was not addressed.  She also had left main spasm improving with intracoronary nitroglycerin.  She denies chest pain but has developed increasing dyspnea on exertion over the last month.  I am to get a 2D echo and a month Lexiscan Myoview stress test to further evaluate.  Chronic diastolic CHF (congestive heart failure) (HCC) Chronic diastolic heart failure with normal systolic function on peritoneal dialysis.  Peripheral vascular disease, unspecified ([place]) History of peripheral arterial disease status post right SFA intervention by Dr. Carlis Abbott 12/14/2018.  Essential hypertension, benign History of essential hypertension blood pressure measured today 142/68.  She is on metoprolol.  Carotid bruit present History of mild to moderate left ICA stenosis by Doppler study  07/25/2018.  We will repeat in 1 year.  Dyslipidemia History of hyperlipidemia previously on Repatha although she said this causes her to itch.  She is currently on lovastatin.  The last lipid profile in our chart was from 711/19 revealing total cholesterol 248 with an LDL of 165.  We will explore Praluent instead  Smoker Discontinued  ESRD on dialysis [facility]) Currently on peritoneal dialysis.  She is exploring renal transplantation.      [name] MD FACP,FACC,FAHA, [facility] 12/15/2018 2:40 PM

## 2018-12-15 NOTE — Assessment & Plan Note (Signed)
Chronic diastolic heart failure with normal systolic function on peritoneal dialysis.

## 2018-12-15 NOTE — Patient Instructions (Addendum)
Medication Instructions:  Your physician recommends that you continue on your current medications as directed. Please refer to the Current Medication list given to you today.  If you need a refill on your cardiac medications before your next appointment, please call your pharmacy.   Lab work: NONE  Testing/Procedures: Your physician has requested that you have an echocardiogram. Echocardiography is a painless test that uses sound waves to create images of your heart. It provides your doctor with information about the size and shape of your heart and how well your heart's chambers and valves are working. This procedure takes approximately one hour. There are no restrictions for this procedure. [place] has requested that you have a lexiscan myoview. For further information please visit [email or web address]. Please follow instruction sheet, as given.  AND  Your physician has requested that you have a carotid duplex in July 2021. This test is an ultrasound of the carotid arteries in your neck. It looks at blood flow through these arteries that supply the brain with blood. Allow one hour for this exam. There are no restrictions or special instructions.   Follow-Up: At [facility], you and your health needs are our priority.  As part of our continuing mission to provide you with exceptional heart care, we have created designated Provider Care Teams.  These Care Teams include your primary Cardiologist (physician) and Advanced Practice Providers (APPs -  Physician Assistants and Nurse Practitioners) who all work together to provide you with the care you need, when you need it. You may see Quay Burow, MD or one of the following Advanced Practice Providers on your designated Care Team:    Kerin Ransom, PA-C  [place], [place]  Coletta Memos, [place]  Your physician wants you to follow-up in: 1 month

## 2018-12-15 NOTE — Assessment & Plan Note (Signed)
Discontinued

## 2018-12-15 NOTE — Assessment & Plan Note (Signed)
History of hyperlipidemia previously on Repatha although she said this causes her to itch.  She is currently on lovastatin.  The last lipid profile in our chart was from 711/19 revealing total cholesterol 248 with an LDL of 165.  We will explore Praluent instead

## 2018-12-19 ENCOUNTER — Telehealth (HOSPITAL_COMMUNITY): Payer: Self-pay

## 2018-12-19 DIAGNOSIS — E872 Acidosis, unspecified: Secondary | ICD-10-CM | POA: Insufficient documentation

## 2018-12-19 DIAGNOSIS — K769 Liver disease, unspecified: Secondary | ICD-10-CM | POA: Insufficient documentation

## 2018-12-19 DIAGNOSIS — N2589 Other disorders resulting from impaired renal tubular function: Secondary | ICD-10-CM | POA: Insufficient documentation

## 2018-12-19 DIAGNOSIS — E875 Hyperkalemia: Secondary | ICD-10-CM | POA: Insufficient documentation

## 2018-12-19 DIAGNOSIS — E440 Moderate protein-calorie malnutrition: Secondary | ICD-10-CM | POA: Insufficient documentation

## 2018-12-19 NOTE — Telephone Encounter (Signed)
Encounter complete. 

## 2018-12-21 ENCOUNTER — Other Ambulatory Visit: Payer: Self-pay

## 2018-12-21 ENCOUNTER — Ambulatory Visit (HOSPITAL_COMMUNITY)
Admission: RE | Admit: 2018-12-21 | Discharge: 2018-12-21 | Disposition: A | Discharge status: Home / Self Care | Payer: Medicare Other | Source: Ambulatory Visit | Attending: Internal Medicine | Admitting: Internal Medicine

## 2018-12-21 DIAGNOSIS — I409 Acute myocarditis, unspecified: Secondary | ICD-10-CM | POA: Diagnosis not present

## 2018-12-21 DIAGNOSIS — R0602 Shortness of breath: Secondary | ICD-10-CM

## 2018-12-21 LAB — MYOCARDIAL PERFUSION IMAGING
LV dias vol: 98 mL (ref 46–106)
LV sys vol: 55 mL
Peak HR: 129 {beats}/min
Rest HR: 109 {beats}/min
SDS: 2
SRS: 5
SSS: 7
TID: 0.89

## 2018-12-21 MED ORDER — AMINOPHYLLINE 25 MG/ML IV SOLN: 75.0000 mg | Freq: Once | INTRAVENOUS | Status: DC

## 2018-12-21 MED ORDER — TECHNETIUM TC 99M TETROFOSMIN IV KIT
10.1000 | PACK | Freq: Once | INTRAVENOUS | Status: DC | PRN
  Filled 2018-12-21: qty 11

## 2018-12-21 MED ORDER — TECHNETIUM TC 99M TETROFOSMIN IV KIT
30.9000 | PACK | Freq: Once | INTRAVENOUS | Status: DC | PRN
  Filled 2018-12-21: qty 31

## 2018-12-21 MED ORDER — REGADENOSON 0.4 MG/5ML IV SOLN: 0.4000 mg | Freq: Once | INTRAVENOUS | Status: DC

## 2018-12-22 ENCOUNTER — Ambulatory Visit (HOSPITAL_COMMUNITY): Payer: Medicare Other | Attending: Cardiovascular Disease

## 2018-12-22 DIAGNOSIS — R0602 Shortness of breath: Secondary | ICD-10-CM | POA: Diagnosis not present

## 2018-12-22 DIAGNOSIS — I409 Acute myocarditis, unspecified: Secondary | ICD-10-CM | POA: Diagnosis not present

## 2018-12-23 ENCOUNTER — Other Ambulatory Visit: Payer: Self-pay

## 2018-12-23 ENCOUNTER — Emergency Department (HOSPITAL_COMMUNITY)
Admission: EM | Admit: 2018-12-23 | Discharge: 2018-12-23 | Disposition: A | Discharge status: Home / Self Care | Payer: Medicare Other | Source: Home / Self Care | Attending: Emergency Medicine | Admitting: Emergency Medicine

## 2018-12-23 ENCOUNTER — Emergency Department (HOSPITAL_COMMUNITY): Payer: Medicare Other

## 2018-12-23 DIAGNOSIS — Y92039 Unspecified place in apartment as the place of occurrence of the external cause: Secondary | ICD-10-CM | POA: Insufficient documentation

## 2018-12-23 DIAGNOSIS — Z87891 Personal history of nicotine dependence: Secondary | ICD-10-CM | POA: Insufficient documentation

## 2018-12-23 DIAGNOSIS — I5032 Chronic diastolic (congestive) heart failure: Secondary | ICD-10-CM | POA: Insufficient documentation

## 2018-12-23 DIAGNOSIS — I5033 Acute on chronic diastolic (congestive) heart failure: Secondary | ICD-10-CM | POA: Diagnosis not present

## 2018-12-23 DIAGNOSIS — N186 End stage renal disease: Secondary | ICD-10-CM | POA: Insufficient documentation

## 2018-12-23 DIAGNOSIS — I132 Hypertensive heart and chronic kidney disease with heart failure and with stage 5 chronic kidney disease, or end stage renal disease: Secondary | ICD-10-CM | POA: Insufficient documentation

## 2018-12-23 DIAGNOSIS — E1122 Type 2 diabetes mellitus with diabetic chronic kidney disease: Secondary | ICD-10-CM | POA: Insufficient documentation

## 2018-12-23 DIAGNOSIS — Z20828 Contact with and (suspected) exposure to other viral communicable diseases: Secondary | ICD-10-CM | POA: Diagnosis not present

## 2018-12-23 DIAGNOSIS — W1830XA Fall on same level, unspecified, initial encounter: Secondary | ICD-10-CM | POA: Insufficient documentation

## 2018-12-23 DIAGNOSIS — Z7982 Long term (current) use of aspirin: Secondary | ICD-10-CM | POA: Insufficient documentation

## 2018-12-23 DIAGNOSIS — Z7901 Long term (current) use of anticoagulants: Secondary | ICD-10-CM | POA: Insufficient documentation

## 2018-12-23 DIAGNOSIS — Z86718 Personal history of other venous thrombosis and embolism: Secondary | ICD-10-CM | POA: Insufficient documentation

## 2018-12-23 DIAGNOSIS — Z79899 Other long term (current) drug therapy: Secondary | ICD-10-CM | POA: Insufficient documentation

## 2018-12-23 DIAGNOSIS — I2510 Atherosclerotic heart disease of native coronary artery without angina pectoris: Secondary | ICD-10-CM | POA: Insufficient documentation

## 2018-12-23 DIAGNOSIS — T148XXA Other injury of unspecified body region, initial encounter: Secondary | ICD-10-CM

## 2018-12-23 DIAGNOSIS — S300XXA Contusion of lower back and pelvis, initial encounter: Secondary | ICD-10-CM

## 2018-12-23 DIAGNOSIS — L0231 Cutaneous abscess of buttock: Secondary | ICD-10-CM | POA: Diagnosis not present

## 2018-12-23 DIAGNOSIS — Y93E3 Activity, vacuuming: Secondary | ICD-10-CM | POA: Insufficient documentation

## 2018-12-23 DIAGNOSIS — W19XXXA Unspecified fall, initial encounter: Secondary | ICD-10-CM

## 2018-12-23 DIAGNOSIS — M4628 Osteomyelitis of vertebra, sacral and sacrococcygeal region: Secondary | ICD-10-CM | POA: Diagnosis not present

## 2018-12-23 DIAGNOSIS — K659 Peritonitis, unspecified: Secondary | ICD-10-CM | POA: Diagnosis not present

## 2018-12-23 DIAGNOSIS — Z992 Dependence on renal dialysis: Secondary | ICD-10-CM | POA: Insufficient documentation

## 2018-12-23 DIAGNOSIS — Z794 Long term (current) use of insulin: Secondary | ICD-10-CM | POA: Insufficient documentation

## 2018-12-23 DIAGNOSIS — Y999 Unspecified external cause status: Secondary | ICD-10-CM | POA: Insufficient documentation

## 2018-12-23 MED ORDER — MORPHINE SULFATE (PF) 4 MG/ML IV SOLN: 4.0000 mg | Freq: Once | INTRAVENOUS | Status: DC

## 2018-12-23 MED ORDER — OXYCODONE-ACETAMINOPHEN 5-325 MG PO TABS
1.0000 | ORAL_TABLET | Freq: Once | ORAL | Status: AC
  Administered 2018-12-23: 1 via ORAL
  Filled 2018-12-23: qty 1

## 2018-12-23 MED ORDER — HYDROCODONE-ACETAMINOPHEN 5-325 MG PO TABS: 1.0000 | ORAL_TABLET | Freq: Four times a day (QID) | ORAL | 0 refills | Status: DC | PRN

## 2018-12-23 MED ORDER — HYDROMORPHONE HCL 1 MG/ML IJ SOLN
0.5000 mg | Freq: Once | INTRAMUSCULAR | Status: AC
  Administered 2018-12-23: 18:00:00 0.5 mg via INTRAMUSCULAR
  Filled 2018-12-23: qty 1

## 2018-12-23 MED ORDER — HYDROMORPHONE HCL 1 MG/ML IJ SOLN: 1.0000 mg | Freq: Once | INTRAMUSCULAR | Status: DC

## 2018-12-23 NOTE — Discharge Instructions (Signed)
Use heat on the area that is swollen  Use a pillow when you sit to help take pressure off the painful area Take Norco as needed for severe pain Take Tylenol for mild-moderate pain Please follow up with your doctor

## 2018-12-23 NOTE — ED Notes (Signed)
PA Kelly aware of tachycardia, will give percocet and d/c per her instruction.

## 2018-12-23 NOTE — ED Provider Notes (Signed)
[facility] EMERGENCY DEPARTMENT Provider Note   CSN: [id number] Arrival date & time: 12/23/18  1433   History Chief Complaint  Patient presents with  . Fall  . Tailbone Pain    Susan Horton is a 48 y.o. female with ESRD on dialysis who presents with a fall and tailbone pain. She states that three days ago she was vacuuming and she tripped over the cord and fell backwards on to her buttocks. Since then she has had gradually worsening pain in the buttocks with a "lump" on the left buttocks. She has been using heat and ice without relief. She missed her dialysis yesterday because she was in too much pain. Nothing makes it better. Movement makes it worse. She denies low back pain, radiation of pain, paresthesias, leg weakness, or bowel/bladder incontinence. She takes ASA and Plavix daily and gets a blood thinner when she goes to dialysis but does not take any oral anticoagulants.    HPI   Past Medical History:  Diagnosis Date  . Anemia   . Angio-edema   . Asthma   . CAD (coronary artery disease)    DES to mid LAD July 2018, residual moderate RCA disease  . Cataract   . CKD (chronic kidney disease) stage 4, GFR 15-29 ml/min (HCC)    Dialysis T/Th/Sa  . DVT (deep venous thrombosis) ([place])    1996 during pregnancy, 2015 left leg  . Eczema   . Essential hypertension 12/11/2015  . Gastroparesis   . GERD (gastroesophageal reflux disease)   . Hidradenitis   . Migraine   . Neuropathy   . Peripheral vascular disease ([place])    blood clot in leg  . Type 2 diabetes mellitus ([place])   . Type II diabetes mellitus ([place])   . Urticaria     Patient Active Problem List   Diagnosis Date Noted  . Symptomatic anemia 09/16/2018  . Peripheral vascular disease, unspecified ([place]) 09/04/2018  . ESRD on dialysis ([place]) 10/11/2017  . Chronic diastolic CHF (congestive heart failure) ([place]) 06/18/2017  . Elevated troponin 06/18/2017  . Moderate persistent asthma 06/10/2017  .  Adjustment disorder with depressed mood 09/08/2016  . Chest pain 08/29/2016  . CKD (chronic kidney disease) stage 4, GFR 15-29 ml/min (HCC)   . CAD S/P mLAD PCI with DES 08/06/2016  . Presence of drug coated stent in LAD coronary artery 08/06/2016  . Abnormal stress test   . Carotid bruit present 06/15/2016  . Dyslipidemia 06/15/2016  . Smoker 06/15/2016  . Neuropathy 03/10/2016  . Essential hypertension, benign 12/11/2015  . Gastroparesis   . Progressive angina ([place]) 06/05/2014  . Uncontrolled type 2 diabetes mellitus with peripheral neuropathy ([place]) 06/05/2014    Past Surgical History:  Procedure Laterality Date  . A/V FISTULAGRAM N/A 06/22/2017   Procedure: A/V FISTULAGRAM - left arm;  Surgeon: [name], MD;  Location: [facility];  Service: Cardiovascular;  Laterality: N/A;  . ABDOMINAL AORTOGRAM W/LOWER EXTREMITY Bilateral 12/14/2018   Procedure: ABDOMINAL AORTOGRAM W/LOWER EXTREMITY;  Surgeon: [name], MD;  Location: [facility];  Service: Cardiovascular;  Laterality: Bilateral;  . ABDOMINOPLASTY    . ADENOIDECTOMY    . APPENDECTOMY  1995  . AV FISTULA PLACEMENT Left 02/01/2017   Procedure: ARTERIOVENOUS BRACHIOCEPHALIC (AV) FISTULA CREATION;  Surgeon: [name], MD;  Location: [facility];  Service: Vascular;  Laterality: Left;  . CHOLECYSTECTOMY  1993  . CORONARY ANGIOPLASTY WITH STENT PLACEMENT    . CORONARY  STENT INTERVENTION N/A 08/05/2016   Procedure: Coronary Stent Intervention;  Surgeon: [name], MD;  Location: [facility];  Service: Cardiovascular;  Laterality: N/A;  . EXPLORATORY LAPAROTOMY  08/14/2005   lysis of adhesions, drainage of tubo-ovarian abscess  . FISTULA SUPERFICIALIZATION Left 09/14/2017   Procedure: FISTULA SUPERFICIALIZATION ARTERIOVENOUS FISTULA LEFT ARM;  Surgeon: [name], MD;  Location: [facility];  Service: Vascular;  Laterality: Left;  . HYDRADENITIS EXCISION  02/08/2011   Procedure:  EXCISION HYDRADENITIS GROIN;  Surgeon: [name], MD;  Location: [facility];  Service: General;  Laterality: N/A;  Excisioin of Hidradenitis Left groin  . INGUINAL HIDRADENITIS EXCISION  07/06/2010   bilateral  . INSERTION OF DIALYSIS CATHETER N/A 09/14/2017   Procedure: INSERTION OF TUNNELED DIALYSIS CATHETER Right Internal Jugular;  Surgeon: [name], MD;  Location: [facility];  Service: Vascular;  Laterality: N/A;  . LEFT HEART CATH AND CORONARY ANGIOGRAPHY N/A 08/05/2016   Procedure: Left Heart Cath and Coronary Angiography;  Surgeon: [name], MD;  Location: [facility];  Service: Cardiovascular;  Laterality: N/A;  . PERIPHERAL VASCULAR INTERVENTION Left 12/14/2018   Procedure: PERIPHERAL VASCULAR INTERVENTION;  Surgeon: [name], MD;  Location: [facility];  Service: Cardiovascular;  Laterality: Left;  SFA  . REDUCTION MAMMAPLASTY  2002  . TONSILLECTOMY    . TUBAL LIGATION  1996  . VAGINAL HYSTERECTOMY  08/04/2005   and cysto     OB History   No obstetric history on file.     Family History  Problem Relation Age of Onset  . Allergic rhinitis Mother   . Hypertension Father   . Allergic rhinitis Father   . Hypertension Sister   . Allergic rhinitis Sister   . Hypertension Brother   . Allergic rhinitis Brother   . Breast cancer Cousin        x2    Social History   Tobacco Use  . Smoking status: Former Smoker    Years: 20.00    Types: Cigarettes    Quit date: 11/2016    Years since quitting: 2.1  . Smokeless tobacco: Never Used  Substance Use Topics  . Alcohol use: No    Alcohol/week: 0.0 standard drinks  . Drug use: No    Home Medications Prior to Admission medications   Medication Sig Start Date End Date Taking? Authorizing Provider  albuterol (PROVENTIL HFA;VENTOLIN HFA) 108 (90 BASE) MCG/ACT inhaler Inhale 1-2 puffs into the lungs every 6 (six) hours as needed for wheezing or shortness of breath.      [provider]  amitriptyline (ELAVIL) 10 MG tablet Take 1 tablet (10 mg total) by mouth at bedtime. 09/19/18   Jamse Arn, MD  aspirin 81 MG chewable tablet Chew 1 tablet (81 mg total) by mouth daily. 08/07/16   Cheryln Manly, NP  B Complex-C-Zn-Folic Acid (DIALYVITE 528 WITH ZINC) 0.8 MG TABS Take 1 tablet by mouth daily. 10/06/18   [provider]  calcitRIOL (ROCALTROL) 0.5 MCG capsule Take 1.5 mcg by mouth daily. 09/11/18   [provider]  calcium acetate (PHOSLO) 667 MG capsule Take 1,334-2,001 mg by mouth See admin instructions. Take three capsules (2001 mg) by mouth up to three times daily with meals and two capsules (1334 mg) with snacks 09/23/17   [provider]  clopidogrel (PLAVIX) 75 MG tablet Take 1 tablet (75 mg total) by mouth daily with breakfast. 10/19/17   Kilroy,  Doreene Burke, PA-C  Ferrous Sulfate (IRON) 325 (65 Fe) MG TABS Take 325 mg by mouth daily. 11/25/15   Brayton Caves, PA-C  Fluticasone-Salmeterol (ADVAIR) 250-50 MCG/DOSE AEPB Inhale 1 puff into the lungs 2 (two) times daily. Patient taking differently: Inhale 1 puff into the lungs daily as needed (shortness of breath).  06/10/17   Charlott Rakes, MD  gentamicin cream (GARAMYCIN) 0.1 % Apply 1 application topically See admin instructions. Apply to access site nightly 09/11/18   [provider]  Insulin Glargine, 1 Unit Dial, (TOUJEO SOLOSTAR) 300 UNIT/ML SOPN Inject 25 Units into the skin 2 (two) times a day. Patient taking differently: Inject 50 Units into the skin 2 (two) times a day.  05/22/18   Philemon Kingdom, MD  insulin regular (HUMULIN R) 100 units/mL injection Inject 0.2-0.25 mLs (20-25 Units total) into the skin See admin instructions. Patient taking differently: Inject 7-15 Units into the skin 3 (three) times daily before meals. Per CBG 09/22/17   Philemon Kingdom, MD  isosorbide mononitrate (IMDUR) 30 MG 24 hr tablet Take 0.5 tablets (15 mg total) by mouth daily.  10/19/17 12/12/18  Erlene Quan, PA-C  lidocaine-prilocaine (EMLA) cream Apply 1 application topically See admin instructions. Apply small amount to access site (AVF) 30 minutes before hemodialysis, cover with occlusive dressing (Saran Wrap) if going out to dialysis center 09/15/17   [provider]  lovastatin (MEVACOR) 20 MG tablet Take 20 mg by mouth at bedtime.     [provider]  methocarbamol (ROBAXIN) 500 MG tablet Take 1 tablet (500 mg total) by mouth 2 (two) times daily as needed for muscle spasms. 09/07/17   Jamse Arn, MD  metoCLOPramide (REGLAN) 5 MG tablet TAKE 1 TABLET BY MOUTH 4 TIMES DAILY BEFORE MEALS AND AT BEDTIME. Patient taking differently: Take 5 mg by mouth 4 (four) times daily -  before meals and at bedtime.  03/18/17   [name], MD  metoprolol succinate (TOPROL-XL) 50 MG 24 hr tablet Take 1 tablet (50 mg total) by mouth daily. 03/14/18   [name], MD  montelukast (SINGULAIR) 10 MG tablet Take 1 tablet (10 mg total) by mouth at bedtime. Patient taking differently: Take 10 mg by mouth at bedtime as needed (allergies).  04/12/18   Kennith Gain, MD  mupirocin cream (BACTROBAN) 2 % Apply 1 application topically 2 (two) times daily as needed. To affected areas and cover with non-stick gauze. 11/17/18   Katy Apo, NP  nitroGLYCERIN (NITROSTAT) 0.4 MG SL tablet Place 1 tablet (0.4 mg total) under the tongue every 5 (five) minutes as needed for chest pain. 10/19/17   Erlene Quan, PA-C  pantoprazole (PROTONIX) 40 MG tablet TAKE 1 TABLET BY MOUTH DAILY. Patient taking differently: Take 40 mg by mouth daily.  02/14/18   [name], MD  pregabalin (LYRICA) 75 MG capsule Take 1 capsule (75 mg total) by mouth daily. 09/19/18   Jamse Arn, MD  senna-docusate (SENOKOT-S) 8.6-50 MG tablet Take 1 tablet by mouth 2 (two) times daily. Patient taking differently: Take 1 tablet by mouth daily as needed for moderate constipation.   08/30/16   Geradine Girt, DO  traMADol (ULTRAM) 50 MG tablet Take 1 tablet (50 mg total) by mouth every 12 (twelve) hours as needed for severe pain. 11/17/18   Katy Apo, NP  trimethoprim-polymyxin b (POLYTRIM) ophthalmic solution Place 1 drop into both eyes See admin instructions. Instill one drop into both eyes  four times daily for 2 days after each monthly eye injection 06/16/17   [provider]    Allergies    Penicillins, Repatha [evolocumab], and Lisinopril  Review of Systems   Review of Systems  Musculoskeletal: Negative for back pain.       +tailbone pain  Neurological: Negative for weakness and numbness.  Hematological: Bruises/bleeds easily.    Physical Exam Updated Vital Signs BP 137/71 (BP Location: Right Arm)   Pulse (!) 124   Temp 98.5 F (36.9 C) (Oral)   Resp 20   Wt 73.9 kg   SpO2 100%   BMI 27.11 kg/m   Physical Exam Vitals and nursing note reviewed.  Constitutional:      General: She is not in acute distress.    Appearance: Normal appearance. She is well-developed. She is not ill-appearing.     Comments: Appears uncomfortable due to pain. Cooperative  HENT:     Head: Normocephalic and atraumatic.  Eyes:     General: No scleral icterus.       Right eye: No discharge.        Left eye: No discharge.     Conjunctiva/sclera: Conjunctivae normal.     Pupils: Pupils are equal, round, and reactive to light.  Cardiovascular:     Rate and Rhythm: Normal rate.  Pulmonary:     Effort: Pulmonary effort is normal. No respiratory distress.  Abdominal:     General: There is no distension.  Musculoskeletal:     Cervical back: Normal range of motion.     Comments: No low back tenderness. There is a small hematoma over the L buttock with significant tenderness.   Pt is ambulatory  Skin:    General: Skin is warm and dry.  Neurological:     Mental Status: She is alert and oriented to person, place, and time.  Psychiatric:        Behavior:  Behavior normal.     ED Results / Procedures / Treatments   Labs (all labs ordered are listed, but only abnormal results are displayed) Labs Reviewed - No data to display  EKG None  Radiology DG Sacrum/Coccyx  Result Date: 12/23/2018 CLINICAL DATA:  Pt in with tailbone pain s/p fall 4 days ago. Able to ambulate, states it hurst to sit as there is a knot to her L buttocks EXAM: SACRUM AND COCCYX - 2+ VIEW COMPARISON:  None. FINDINGS: There is no evidence of fracture or other focal bone lesions. IMPRESSION: Negative. Electronically Signed   By: Lajean Manes M.D.   On: 12/23/2018 17:10   ECHOCARDIOGRAM COMPLETE  Result Date: 12/22/2018   ECHOCARDIOGRAM REPORT   Patient Name:   Susan Horton Date of Exam: 12/22/2018 Medical Rec #:  [id number]     Height:       65.0 in Accession #:    [id number]    Weight:       163.0 lb Date of Birth:  1970-11-18      BSA:          1.81 m Patient Age:    57 years      BP:           142/68 mmHg Patient Gender: F             HR:           114 bpm. Exam Location:  [facility] Procedure: 2D Echo, Cardiac Doppler and Color Doppler Indications:    R06.02 SOB; I40.9 Acute myocarditis  History:        Patient has prior history of Echocardiogram examinations, most                 recent 06/19/2017. CHF, CAD, Signs/Symptoms:Chest Pain; Risk                 Factors:Hypertension, Diabetes and Dyslipidemia. Chronic kidney                 disease. End stage renal disease. PVD.  Sonographer:    Diamond Nickel RCS Referring Phys: [place]  1. Left ventricular ejection fraction, by visual estimation, is 60 to 65%. The left ventricle has normal function. There is mildly increased left ventricular hypertrophy.  2. The left ventricle has no regional wall motion abnormalities.  3. Global right ventricle has normal systolic function.The right ventricular size is normal. No increase in right ventricular wall thickness.  4. Left atrial size was normal.  5. Right  atrial size was normal.  6. The mitral valve is normal in structure. Trivial mitral valve regurgitation. No evidence of mitral stenosis.  7. The tricuspid valve is normal in structure. Tricuspid valve regurgitation is mild.  8. The aortic valve is tricuspid. Aortic valve regurgitation is trivial. Mild aortic valve sclerosis without stenosis.  9. The pulmonic valve was grossly normal. Pulmonic valve regurgitation is trivial. 10. The inferior vena cava is normal in size with greater than 50% respiratory variability, suggesting right atrial pressure of 3 mmHg. FINDINGS  Left Ventricle: Left ventricular ejection fraction, by visual estimation, is 60 to 65%. The left ventricle has normal function. The left ventricle has no regional wall motion abnormalities. There is mildly increased left ventricular hypertrophy. Normal left atrial pressure. Right Ventricle: The right ventricular size is normal. No increase in right ventricular wall thickness. Global RV systolic function is has normal systolic function. Left Atrium: Left atrial size was normal in size. Right Atrium: Right atrial size was normal in size Pericardium: There is no evidence of pericardial effusion. Mitral Valve: The mitral valve is normal in structure. Trivial mitral valve regurgitation. No evidence of mitral valve stenosis by observation. Tricuspid Valve: The tricuspid valve is normal in structure. Tricuspid valve regurgitation is mild. Aortic Valve: The aortic valve is tricuspid. Aortic valve regurgitation is trivial. Mild aortic valve sclerosis is present, with no evidence of aortic valve stenosis. Pulmonic Valve: The pulmonic valve was grossly normal. Pulmonic valve regurgitation is trivial. Pulmonic regurgitation is trivial. Aorta: The aortic root, ascending aorta and aortic arch are all structurally normal, with no evidence of dilitation or obstruction. Venous: The inferior vena cava is normal in size with greater than 50% respiratory variability,  suggesting right atrial pressure of 3 mmHg. IAS/Shunts: No atrial level shunt detected by color flow Doppler. There is no evidence of a patent foramen ovale. No ventricular septal defect is seen or detected. There is no evidence of an atrial septal defect.  LEFT VENTRICLE PLAX 2D LVIDd:         3.70 cm LVIDs:         2.60 cm LV PW:         1.60 cm LV IVS:        1.30 cm LVOT diam:     2.05 cm LV SV:         34 ml LV SV Index:   18.04 LVOT Area:     3.30 cm  RIGHT VENTRICLE RV S prime:     10.90 cm/s TAPSE (  M-mode): 2.4 cm LEFT ATRIUM             Index       RIGHT ATRIUM          Index LA diam:        3.10 cm 1.71 cm/m  RA Area:     9.84 cm LA Vol (A2C):   37.6 ml 20.73 ml/m RA Volume:   20.60 ml 11.36 ml/m LA Vol (A4C):   33.4 ml 18.42 ml/m LA Biplane Vol: 36.8 ml 20.29 ml/m  AORTIC VALVE LVOT Vmax:   107.33 cm/s LVOT Vmean:  64.867 cm/s LVOT VTI:    0.185 m  AORTA Ao Root diam: 3.20 cm  SHUNTS Systemic VTI:  0.18 m Systemic Diam: 2.05 cm  Jenkins Rouge MD Electronically signed by Jenkins Rouge MD Signature Date/Time: 12/22/2018/4:51:37 PM    Final     Procedures Procedures (including critical care time)  Medications Ordered in ED Medications  HYDROmorphone (DILAUDID) injection 0.5 mg (0.5 mg Intramuscular Given 12/23/18 1756)  oxyCODONE-acetaminophen (PERCOCET/ROXICET) 5-325 MG per tablet 1 tablet (1 tablet Oral Given 12/23/18 1858)    ED Course  I have reviewed the triage vital signs and the nursing notes.  Pertinent labs & imaging results that were available during my care of the patient were reviewed by me and considered in my medical decision making (see chart for details).  48 year old female presents 4 days status post mechanical fall at home with pain over the tailbone and swelling over the left buttocks.  Area looks consistent with hematoma which is likely exacerbated by her antiplatelet use.  X-ray was obtained which did not show an acute fracture.  She states that she does have a  donut pillow at home.  She was given pain control here and we will give her a small amount of pain medicine for home.  She was tachycardic in triage and on repeat she is still mildly tachycardic which I believe is pain induced.  She is advised to return if worsening.  MDM Rules/Calculators/A&P  Final Clinical Impression(s) / ED Diagnoses Final diagnoses:  Fall, initial encounter  Contusion of coccyx, initial encounter  Hematoma    Rx / DC Orders ED Discharge Orders    None       Recardo Evangelist, PA-C 12/23/18 2253    Varney Biles, MD 12/24/18 1252

## 2018-12-23 NOTE — ED Notes (Signed)
Discharge instructions and prescription discussed with Pt. Pt verbalized understanding. Pt stable and ambulatory.    

## 2018-12-23 NOTE — ED Triage Notes (Signed)
Pt in with tailbone pain s/p fall 4 days ago. Able to ambulate, states it hurst to sit as there is a knot to her L buttocks

## 2018-12-25 ENCOUNTER — Encounter (HOSPITAL_COMMUNITY): Payer: Self-pay | Admitting: Emergency Medicine

## 2018-12-25 ENCOUNTER — Other Ambulatory Visit: Payer: Self-pay

## 2018-12-25 ENCOUNTER — Inpatient Hospital Stay (HOSPITAL_COMMUNITY)
Admission: EM | Admit: 2018-12-25 | Discharge: 2019-01-05 | DRG: 602 | Disposition: A | Discharge status: Home / Self Care | Payer: Medicare Other | Attending: Family Medicine | Admitting: Family Medicine

## 2018-12-25 ENCOUNTER — Emergency Department (HOSPITAL_COMMUNITY): Payer: Medicare Other

## 2018-12-25 DIAGNOSIS — K644 Residual hemorrhoidal skin tags: Secondary | ICD-10-CM | POA: Diagnosis present

## 2018-12-25 DIAGNOSIS — Z9861 Coronary angioplasty status: Secondary | ICD-10-CM

## 2018-12-25 DIAGNOSIS — Z87892 Personal history of anaphylaxis: Secondary | ICD-10-CM

## 2018-12-25 DIAGNOSIS — N2581 Secondary hyperparathyroidism of renal origin: Secondary | ICD-10-CM | POA: Diagnosis present

## 2018-12-25 DIAGNOSIS — Z794 Long term (current) use of insulin: Secondary | ICD-10-CM

## 2018-12-25 DIAGNOSIS — K5900 Constipation, unspecified: Secondary | ICD-10-CM | POA: Diagnosis present

## 2018-12-25 DIAGNOSIS — I10 Essential (primary) hypertension: Secondary | ICD-10-CM | POA: Diagnosis present

## 2018-12-25 DIAGNOSIS — E876 Hypokalemia: Secondary | ICD-10-CM | POA: Diagnosis present

## 2018-12-25 DIAGNOSIS — I5032 Chronic diastolic (congestive) heart failure: Secondary | ICD-10-CM | POA: Diagnosis present

## 2018-12-25 DIAGNOSIS — K3184 Gastroparesis: Secondary | ICD-10-CM | POA: Diagnosis present

## 2018-12-25 DIAGNOSIS — W010XXA Fall on same level from slipping, tripping and stumbling without subsequent striking against object, initial encounter: Secondary | ICD-10-CM | POA: Diagnosis not present

## 2018-12-25 DIAGNOSIS — E1136 Type 2 diabetes mellitus with diabetic cataract: Secondary | ICD-10-CM | POA: Diagnosis present

## 2018-12-25 DIAGNOSIS — R778 Other specified abnormalities of plasma proteins: Secondary | ICD-10-CM | POA: Diagnosis present

## 2018-12-25 DIAGNOSIS — Z888 Allergy status to other drugs, medicaments and biological substances status: Secondary | ICD-10-CM

## 2018-12-25 DIAGNOSIS — K648 Other hemorrhoids: Secondary | ICD-10-CM | POA: Diagnosis present

## 2018-12-25 DIAGNOSIS — N186 End stage renal disease: Secondary | ICD-10-CM

## 2018-12-25 DIAGNOSIS — E785 Hyperlipidemia, unspecified: Secondary | ICD-10-CM | POA: Diagnosis present

## 2018-12-25 DIAGNOSIS — I2510 Atherosclerotic heart disease of native coronary artery without angina pectoris: Secondary | ICD-10-CM

## 2018-12-25 DIAGNOSIS — J4540 Moderate persistent asthma, uncomplicated: Secondary | ICD-10-CM | POA: Diagnosis present

## 2018-12-25 DIAGNOSIS — K659 Peritonitis, unspecified: Secondary | ICD-10-CM

## 2018-12-25 DIAGNOSIS — K219 Gastro-esophageal reflux disease without esophagitis: Secondary | ICD-10-CM | POA: Diagnosis present

## 2018-12-25 DIAGNOSIS — E1140 Type 2 diabetes mellitus with diabetic neuropathy, unspecified: Secondary | ICD-10-CM | POA: Diagnosis present

## 2018-12-25 DIAGNOSIS — D649 Anemia, unspecified: Secondary | ICD-10-CM | POA: Diagnosis present

## 2018-12-25 DIAGNOSIS — Z86718 Personal history of other venous thrombosis and embolism: Secondary | ICD-10-CM

## 2018-12-25 DIAGNOSIS — Z6827 Body mass index (BMI) 27.0-27.9, adult: Secondary | ICD-10-CM

## 2018-12-25 DIAGNOSIS — Z955 Presence of coronary angioplasty implant and graft: Secondary | ICD-10-CM

## 2018-12-25 DIAGNOSIS — E1151 Type 2 diabetes mellitus with diabetic peripheral angiopathy without gangrene: Secondary | ICD-10-CM | POA: Diagnosis present

## 2018-12-25 DIAGNOSIS — Z7902 Long term (current) use of antithrombotics/antiplatelets: Secondary | ICD-10-CM

## 2018-12-25 DIAGNOSIS — E1142 Type 2 diabetes mellitus with diabetic polyneuropathy: Secondary | ICD-10-CM | POA: Diagnosis present

## 2018-12-25 DIAGNOSIS — Z87891 Personal history of nicotine dependence: Secondary | ICD-10-CM

## 2018-12-25 DIAGNOSIS — Z8249 Family history of ischemic heart disease and other diseases of the circulatory system: Secondary | ICD-10-CM

## 2018-12-25 DIAGNOSIS — E1165 Type 2 diabetes mellitus with hyperglycemia: Secondary | ICD-10-CM | POA: Diagnosis present

## 2018-12-25 DIAGNOSIS — L0231 Cutaneous abscess of buttock: Principal | ICD-10-CM | POA: Diagnosis present

## 2018-12-25 DIAGNOSIS — Y929 Unspecified place or not applicable: Secondary | ICD-10-CM

## 2018-12-25 DIAGNOSIS — R7989 Other specified abnormal findings of blood chemistry: Secondary | ICD-10-CM | POA: Diagnosis present

## 2018-12-25 DIAGNOSIS — D72829 Elevated white blood cell count, unspecified: Secondary | ICD-10-CM

## 2018-12-25 DIAGNOSIS — Z7982 Long term (current) use of aspirin: Secondary | ICD-10-CM

## 2018-12-25 DIAGNOSIS — D631 Anemia in chronic kidney disease: Secondary | ICD-10-CM | POA: Diagnosis present

## 2018-12-25 DIAGNOSIS — Z79899 Other long term (current) drug therapy: Secondary | ICD-10-CM

## 2018-12-25 DIAGNOSIS — Z992 Dependence on renal dialysis: Secondary | ICD-10-CM

## 2018-12-25 DIAGNOSIS — D62 Acute posthemorrhagic anemia: Secondary | ICD-10-CM | POA: Diagnosis present

## 2018-12-25 DIAGNOSIS — Z880 Allergy status to penicillin: Secondary | ICD-10-CM

## 2018-12-25 DIAGNOSIS — S300XXA Contusion of lower back and pelvis, initial encounter: Secondary | ICD-10-CM | POA: Diagnosis present

## 2018-12-25 DIAGNOSIS — E1122 Type 2 diabetes mellitus with diabetic chronic kidney disease: Secondary | ICD-10-CM | POA: Diagnosis present

## 2018-12-25 DIAGNOSIS — Z20828 Contact with and (suspected) exposure to other viral communicable diseases: Secondary | ICD-10-CM | POA: Diagnosis present

## 2018-12-25 DIAGNOSIS — E1143 Type 2 diabetes mellitus with diabetic autonomic (poly)neuropathy: Secondary | ICD-10-CM | POA: Diagnosis present

## 2018-12-25 DIAGNOSIS — IMO0002 Reserved for concepts with insufficient information to code with codable children: Secondary | ICD-10-CM | POA: Diagnosis present

## 2018-12-25 DIAGNOSIS — I132 Hypertensive heart and chronic kidney disease with heart failure and with stage 5 chronic kidney disease, or end stage renal disease: Secondary | ICD-10-CM | POA: Diagnosis present

## 2018-12-25 DIAGNOSIS — Z9115 Patient's noncompliance with renal dialysis: Secondary | ICD-10-CM

## 2018-12-25 DIAGNOSIS — M4628 Osteomyelitis of vertebra, sacral and sacrococcygeal region: Secondary | ICD-10-CM

## 2018-12-25 HISTORY — DX: Abnormal result of other cardiovascular function study: R94.39

## 2018-12-25 LAB — CBC
HCT: 33.6 % — ABNORMAL LOW (ref 36.0–46.0)
Hemoglobin: 10.3 g/dL — ABNORMAL LOW (ref 12.0–15.0)
MCH: 26.4 pg (ref 26.0–34.0)
MCHC: 30.7 g/dL (ref 30.0–36.0)
MCV: 86.2 fL (ref 80.0–100.0)
Platelets: 453 10*3/uL — ABNORMAL HIGH (ref 150–400)
RBC: 3.9 MIL/uL (ref 3.87–5.11)
RDW: 14.5 % (ref 11.5–15.5)
WBC: 26.4 10*3/uL — ABNORMAL HIGH (ref 4.0–10.5)
nRBC: 0 % (ref 0.0–0.2)

## 2018-12-25 LAB — COMPREHENSIVE METABOLIC PANEL
ALT: 15 U/L (ref 0–44)
AST: 14 U/L — ABNORMAL LOW (ref 15–41)
Albumin: 2.4 g/dL — ABNORMAL LOW (ref 3.5–5.0)
Alkaline Phosphatase: 122 U/L (ref 38–126)
Anion gap: 17 — ABNORMAL HIGH (ref 5–15)
BUN: 55 mg/dL — ABNORMAL HIGH (ref 6–20)
CO2: 22 mmol/L (ref 22–32)
Calcium: 8.8 mg/dL — ABNORMAL LOW (ref 8.9–10.3)
Chloride: 99 mmol/L (ref 98–111)
Creatinine, Ser: 11.09 mg/dL — ABNORMAL HIGH (ref 0.44–1.00)
GFR calc Af Amer: 4 mL/min — ABNORMAL LOW (ref >60)
GFR calc non Af Amer: 4 mL/min — ABNORMAL LOW (ref >60)
Glucose, Bld: 140 mg/dL — ABNORMAL HIGH (ref 70–99)
Potassium: 4.2 mmol/L (ref 3.5–5.1)
Sodium: 138 mmol/L (ref 135–145)
Total Bilirubin: 0.8 mg/dL (ref 0.3–1.2)
Total Protein: 7.2 g/dL (ref 6.5–8.1)

## 2018-12-25 LAB — LACTIC ACID, PLASMA: Lactic Acid, Venous: 1.6 mmol/L (ref 0.5–1.9)

## 2018-12-25 LAB — GLUCOSE, CAPILLARY
Glucose-Capillary: 110 mg/dL — ABNORMAL HIGH (ref 70–99)
Glucose-Capillary: 127 mg/dL — ABNORMAL HIGH (ref 70–99)

## 2018-12-25 LAB — SARS CORONAVIRUS 2 (TAT 6-24 HRS): SARS Coronavirus 2: NEGATIVE

## 2018-12-25 LAB — LIPASE, BLOOD: Lipase: 15 U/L (ref 11–51)

## 2018-12-25 MED ORDER — FENTANYL CITRATE (PF) 100 MCG/2ML IJ SOLN: 12.5000 ug | INTRAMUSCULAR | Status: DC | PRN

## 2018-12-25 MED ORDER — FENTANYL CITRATE (PF) 100 MCG/2ML IJ SOLN
12.5000 ug | INTRAMUSCULAR | Status: DC | PRN
  Administered 2018-12-25: 12.5 ug via INTRAVENOUS
  Filled 2018-12-25: qty 2

## 2018-12-25 MED ORDER — DELFLEX-LC/1.5% DEXTROSE 344 MOSM/L IP SOLN: INTRAPERITONEAL | Status: AC

## 2018-12-25 MED ORDER — GENTAMICIN SULFATE 0.1 % EX CREA
1.0000 "application " | TOPICAL_CREAM | Freq: Every day | CUTANEOUS | Status: DC
  Administered 2018-12-26 – 2019-01-01 (×10): 1 via TOPICAL
  Filled 2018-12-25: qty 15

## 2018-12-25 MED ORDER — VANCOMYCIN HCL 10 G IV SOLR
2000.0000 mg | Freq: Once | INTRAVENOUS | Status: DC
  Administered 2018-12-25: 2000 mg via INTRAVENOUS
  Filled 2018-12-25: qty 2000

## 2018-12-25 MED ORDER — ALBUTEROL SULFATE (2.5 MG/3ML) 0.083% IN NEBU: 3.0000 mL | INHALATION_SOLUTION | Freq: Four times a day (QID) | RESPIRATORY_TRACT | Status: DC | PRN

## 2018-12-25 MED ORDER — CALCIUM ACETATE (PHOS BINDER) 667 MG PO CAPS: 1334.0000 mg | ORAL_CAPSULE | ORAL | Status: DC | PRN

## 2018-12-25 MED ORDER — CALCITRIOL 0.5 MCG PO CAPS
1.5000 ug | ORAL_CAPSULE | Freq: Every day | ORAL | Status: DC
  Administered 2018-12-28 – 2019-01-05 (×8): 1.5 ug via ORAL
  Filled 2018-12-25 (×11): qty 3

## 2018-12-25 MED ORDER — PRAVASTATIN SODIUM 10 MG PO TABS
20.0000 mg | ORAL_TABLET | Freq: Every day | ORAL | Status: DC
  Administered 2018-12-26 – 2019-01-05 (×11): 20 mg via ORAL
  Filled 2018-12-25 (×11): qty 2

## 2018-12-25 MED ORDER — FENTANYL CITRATE (PF) 100 MCG/2ML IJ SOLN: 25.0000 ug | INTRAMUSCULAR | Status: DC | PRN

## 2018-12-25 MED ORDER — ACETAMINOPHEN 325 MG PO TABS
650.0000 mg | ORAL_TABLET | Freq: Four times a day (QID) | ORAL | Status: DC
  Administered 2018-12-26 – 2019-01-05 (×40): 650 mg via ORAL
  Filled 2018-12-25 (×41): qty 2

## 2018-12-25 MED ORDER — PANTOPRAZOLE SODIUM 40 MG PO TBEC
40.0000 mg | DELAYED_RELEASE_TABLET | Freq: Every day | ORAL | Status: DC
  Administered 2018-12-26 – 2019-01-05 (×9): 40 mg via ORAL
  Filled 2018-12-25 (×11): qty 1

## 2018-12-25 MED ORDER — HYDROMORPHONE HCL 1 MG/ML IJ SOLN: 1.0000 mg | INTRAMUSCULAR | Status: DC | PRN

## 2018-12-25 MED ORDER — HEPARIN 1000 UNIT/ML FOR PERITONEAL DIALYSIS: 500.0000 [IU] | INTRAMUSCULAR | Status: DC | PRN

## 2018-12-25 MED ORDER — SODIUM CHLORIDE 0.9 % IV SOLN: 1.0000 g | Freq: Once | INTRAVENOUS | Status: DC

## 2018-12-25 MED ORDER — HYDROMORPHONE HCL 1 MG/ML PO LIQD: 0.5000 mg | Freq: Four times a day (QID) | ORAL | Status: DC | PRN

## 2018-12-25 MED ORDER — DEXTROSE 5 % IV SOLN
0.5000 g | Freq: Three times a day (TID) | INTRAVENOUS | Status: DC
  Administered 2018-12-26 – 2018-12-28 (×8): 0.5 g via INTRAVENOUS
  Filled 2018-12-25 (×11): qty 0.5

## 2018-12-25 MED ORDER — FERROUS SULFATE 325 (65 FE) MG PO TABS
325.0000 mg | ORAL_TABLET | Freq: Every day | ORAL | Status: DC
  Administered 2018-12-26: 325 mg via ORAL
  Filled 2018-12-25: qty 1

## 2018-12-25 MED ORDER — OXYCODONE HCL 5 MG PO TABS: 5.0000 mg | ORAL_TABLET | Freq: Four times a day (QID) | ORAL | Status: DC | PRN

## 2018-12-25 MED ORDER — INSULIN ASPART 100 UNIT/ML ~~LOC~~ SOLN
0.0000 [IU] | Freq: Three times a day (TID) | SUBCUTANEOUS | Status: DC
  Administered 2018-12-26 – 2018-12-28 (×4): 1 [IU] via SUBCUTANEOUS
  Administered 2018-12-29: 2 [IU] via SUBCUTANEOUS
  Administered 2018-12-29 (×2): 1 [IU] via SUBCUTANEOUS
  Administered 2018-12-30: 2 [IU] via SUBCUTANEOUS
  Administered 2018-12-30: 1 [IU] via SUBCUTANEOUS

## 2018-12-25 MED ORDER — HYDROMORPHONE HCL 1 MG/ML IJ SOLN
1.0000 mg | Freq: Once | INTRAMUSCULAR | Status: AC
  Administered 2018-12-25: 1 mg via INTRAVENOUS
  Filled 2018-12-25: qty 1

## 2018-12-25 MED ORDER — HYDROMORPHONE HCL 1 MG/ML IJ SOLN
1.0000 mg | Freq: Four times a day (QID) | INTRAMUSCULAR | Status: DC | PRN
  Administered 2018-12-25 – 2018-12-27 (×5): 1 mg via INTRAVENOUS
  Filled 2018-12-25 (×5): qty 1

## 2018-12-25 MED ORDER — VANCOMYCIN HCL IN DEXTROSE 1-5 GM/200ML-% IV SOLN: 1000.0000 mg | Freq: Once | INTRAVENOUS | Status: DC

## 2018-12-25 MED ORDER — CALCIUM ACETATE (PHOS BINDER) 667 MG PO CAPS
2001.0000 mg | ORAL_CAPSULE | Freq: Three times a day (TID) | ORAL | Status: DC
  Administered 2018-12-26: 2001 mg via ORAL
  Filled 2018-12-25: qty 3

## 2018-12-25 MED ORDER — OXYCODONE HCL 5 MG PO TABS
5.0000 mg | ORAL_TABLET | Freq: Four times a day (QID) | ORAL | Status: DC
  Administered 2018-12-26 – 2019-01-01 (×26): 5 mg via ORAL
  Filled 2018-12-25 (×26): qty 1

## 2018-12-25 MED ORDER — AMITRIPTYLINE HCL 10 MG PO TABS
10.0000 mg | ORAL_TABLET | Freq: Every day | ORAL | Status: DC
  Administered 2018-12-25 – 2019-01-03 (×9): 10 mg via ORAL
  Filled 2018-12-25 (×11): qty 1

## 2018-12-25 MED ORDER — VANCOMYCIN VARIABLE DOSE PER UNSTABLE RENAL FUNCTION (PHARMACIST DOSING): Status: DC

## 2018-12-25 MED ORDER — VANCOMYCIN HCL 10 G IV SOLR
1500.0000 mg | Freq: Once | INTRAVENOUS | Status: DC
  Filled 2018-12-25: qty 1500

## 2018-12-25 MED ORDER — MOMETASONE FURO-FORMOTEROL FUM 200-5 MCG/ACT IN AERO
2.0000 | INHALATION_SPRAY | Freq: Two times a day (BID) | RESPIRATORY_TRACT | Status: DC
  Administered 2018-12-26 – 2019-01-05 (×18): 2 via RESPIRATORY_TRACT
  Filled 2018-12-25: qty 8.8

## 2018-12-25 MED ORDER — DEXTROSE 5 % IV SOLN
0.5000 g | Freq: Three times a day (TID) | INTRAVENOUS | Status: DC
  Administered 2018-12-25: 0.5 g via INTRAVENOUS
  Filled 2018-12-25 (×2): qty 0.5

## 2018-12-25 MED ORDER — ASPIRIN 81 MG PO CHEW
81.0000 mg | CHEWABLE_TABLET | Freq: Every day | ORAL | Status: DC
  Administered 2018-12-26: 81 mg via ORAL
  Filled 2018-12-25: qty 1

## 2018-12-25 MED ORDER — SODIUM CHLORIDE 0.9 % IV SOLN: 2.0000 g | Freq: Once | INTRAVENOUS | Status: DC

## 2018-12-25 MED ORDER — SODIUM CHLORIDE 0.9% FLUSH
3.0000 mL | Freq: Once | INTRAVENOUS | Status: AC
  Administered 2018-12-25: 3 mL via INTRAVENOUS

## 2018-12-25 MED ORDER — METRONIDAZOLE IN NACL 5-0.79 MG/ML-% IV SOLN
500.0000 mg | Freq: Three times a day (TID) | INTRAVENOUS | Status: DC
  Administered 2018-12-25 – 2018-12-29 (×12): 500 mg via INTRAVENOUS
  Filled 2018-12-25 (×12): qty 100

## 2018-12-25 MED ORDER — METOCLOPRAMIDE HCL 5 MG PO TABS
5.0000 mg | ORAL_TABLET | Freq: Three times a day (TID) | ORAL | Status: DC
  Administered 2018-12-25 – 2019-01-05 (×43): 5 mg via ORAL
  Filled 2018-12-25 (×45): qty 1

## 2018-12-25 MED ORDER — CLOPIDOGREL BISULFATE 75 MG PO TABS
75.0000 mg | ORAL_TABLET | Freq: Every day | ORAL | Status: DC
  Administered 2018-12-26: 75 mg via ORAL
  Filled 2018-12-25: qty 1

## 2018-12-25 MED ORDER — HEPARIN 1000 UNIT/ML FOR PERITONEAL DIALYSIS
INTRAPERITONEAL | Status: DC | PRN
  Filled 2018-12-25 (×5): qty 5000

## 2018-12-25 MED ORDER — DEXTROSE 5 % IV SOLN
0.5000 g | Freq: Two times a day (BID) | INTRAVENOUS | Status: DC
  Filled 2018-12-25 (×2): qty 0.5

## 2018-12-25 MED ORDER — METOPROLOL SUCCINATE ER 25 MG PO TB24
50.0000 mg | ORAL_TABLET | Freq: Every day | ORAL | Status: DC
  Administered 2018-12-26 – 2019-01-05 (×10): 50 mg via ORAL
  Filled 2018-12-25 (×11): qty 2

## 2018-12-25 MED ORDER — PREGABALIN 75 MG PO CAPS
75.0000 mg | ORAL_CAPSULE | Freq: Every day | ORAL | Status: DC
  Administered 2018-12-26 – 2019-01-05 (×5): 75 mg via ORAL
  Filled 2018-12-25 (×10): qty 1

## 2018-12-25 NOTE — Consult Note (Signed)
Reason for Consult: sacral phlegmon Referring Physician: Caron Presume, MD  Susan Horton is an 48 y.o. female.   HPI: 3F s/p mechanical GLF 12/9 with sacral pain causing her to present to the ED 12/12. She underwent XR imaging of her sacrum/coccyx which was negative for fracture and was discharged from the ED with pain control. She presents again today with persistent, worsening pain and swelling and underwent CT imaging notable for a 2x1x1cm phlegmon at the level of the sacrum. Notably, she has multiple co-morbidities including ESRD on home PD, CHF, T2DM, HTN, and PVD. She denies any f/c/n/v at home and reports no recent BM, unable to tell me when her last BM was.   Past Medical History:  Diagnosis Date  . Anemia   . Angio-edema   . Asthma   . CAD (coronary artery disease)    DES to mid LAD July 2018, residual moderate RCA disease  . Cataract   . CKD (chronic kidney disease) stage 4, GFR 15-29 ml/min (HCC)    Dialysis T/Th/Sa  . DVT (deep venous thrombosis) ([place])    1996 during pregnancy, 2015 left leg  . Eczema   . Essential hypertension 12/11/2015  . Gastroparesis   . GERD (gastroesophageal reflux disease)   . Hidradenitis   . Migraine   . Neuropathy   . Peripheral vascular disease ([place])    blood clot in leg  . Type 2 diabetes mellitus ([place])   . Type II diabetes mellitus ([place])   . Urticaria     Past Surgical History:  Procedure Laterality Date  . A/V FISTULAGRAM N/A 06/22/2017   Procedure: A/V FISTULAGRAM - left arm;  Surgeon: [name], MD;  Location: [facility];  Service: Cardiovascular;  Laterality: N/A;  . ABDOMINAL AORTOGRAM W/LOWER EXTREMITY Bilateral 12/14/2018   Procedure: ABDOMINAL AORTOGRAM W/LOWER EXTREMITY;  Surgeon: [name], MD;  Location: [facility];  Service: Cardiovascular;  Laterality: Bilateral;  . ABDOMINOPLASTY    . ADENOIDECTOMY    . APPENDECTOMY  1995  . AV FISTULA PLACEMENT Left 02/01/2017   Procedure:  ARTERIOVENOUS BRACHIOCEPHALIC (AV) FISTULA CREATION;  Surgeon: [name], MD;  Location: [facility];  Service: Vascular;  Laterality: Left;  . CHOLECYSTECTOMY  1993  . CORONARY ANGIOPLASTY WITH STENT PLACEMENT    . CORONARY STENT INTERVENTION N/A 08/05/2016   Procedure: Coronary Stent Intervention;  Surgeon: [name], MD;  Location: [facility];  Service: Cardiovascular;  Laterality: N/A;  . EXPLORATORY LAPAROTOMY  08/14/2005   lysis of adhesions, drainage of tubo-ovarian abscess  . FISTULA SUPERFICIALIZATION Left 09/14/2017   Procedure: FISTULA SUPERFICIALIZATION ARTERIOVENOUS FISTULA LEFT ARM;  Surgeon: [name], MD;  Location: [facility];  Service: Vascular;  Laterality: Left;  . HYDRADENITIS EXCISION  02/08/2011   Procedure: EXCISION HYDRADENITIS GROIN;  Surgeon: [name], MD;  Location: [facility];  Service: General;  Laterality: N/A;  Excisioin of Hidradenitis Left groin  . INGUINAL HIDRADENITIS EXCISION  07/06/2010   bilateral  . INSERTION OF DIALYSIS CATHETER N/A 09/14/2017   Procedure: INSERTION OF TUNNELED DIALYSIS CATHETER Right Internal Jugular;  Surgeon: [name], MD;  Location: [facility];  Service: Vascular;  Laterality: N/A;  . LEFT HEART CATH AND CORONARY ANGIOGRAPHY N/A 08/05/2016   Procedure: Left Heart Cath and Coronary Angiography;  Surgeon: [name], MD;  Location: [facility];  Service: Cardiovascular;  Laterality: N/A;  . PERIPHERAL VASCULAR INTERVENTION Left 12/14/2018  Procedure: PERIPHERAL VASCULAR INTERVENTION;  Surgeon: [name], MD;  Location: [facility];  Service: Cardiovascular;  Laterality: Left;  SFA  . REDUCTION MAMMAPLASTY  2002  . TONSILLECTOMY    . TUBAL LIGATION  1996  . VAGINAL HYSTERECTOMY  08/04/2005   and cysto    Family History  Problem Relation Age of Onset  . Allergic rhinitis Mother   . Hypertension Father   . Allergic rhinitis Father   . Hypertension Sister    . Allergic rhinitis Sister   . Hypertension Brother   . Allergic rhinitis Brother   . Breast cancer Cousin        x2    Social History:  reports that she quit smoking about 2 years ago. Her smoking use included cigarettes. She quit after 20.00 years of use. She has never used smokeless tobacco. She reports that she does not drink alcohol or use drugs.  Allergies:  Allergies  Allergen Reactions  . Penicillins Anaphylaxis, Itching, Swelling, Rash and Other (See Comments)    Swelling of throat & whole mouth  Has patient had a PCN reaction causing immediate rash, facial/tongue/throat swelling, SOB or lightheadedness with hypotension: Yes Has patient had a PCN reaction causing severe rash involving mucus membranes or skin necrosis: No Has patient had a PCN reaction that required hospitalization: No Has patient had a PCN reaction occurring within the last 10 years: No If all of the above answers are "NO", then may proceed with Cephalosporin use. Swelling of throat & whole mouth -angioedema Has patient had a PCN reaction causing immediate rash, facial/tongue/throat swelling, SOB or lightheadedness with hypotension: Yes Has patient had a PCN reaction causing severe rash involving mucus membranes or skin necrosis: No Has patient had a PCN reaction that required hospitalization: No Has patient had a PCN reaction occurring within the last 10 years: No If all of the above answers are "NO", then may proceed with Cephalosporin use. Swelling of th Swelling of throat & whole mouth  Has patient had a PCN reaction causing immediate rash, facial/tongue/throat swelling, SOB or lightheadedness with hypotension: Yes Has patient had a PCN reaction causing severe rash involving mucus membranes or skin necrosis: No Has patient had a PCN reaction that required hospitalization: No Has patient had a PCN reaction occurring within the last 10 years: No If all of the above answers are "NO", then may proceed with  Cephalosporin use. Swelling of tongue itching and rash   . Repatha [Evolocumab] Itching  . Lisinopril Cough    Medications: I have reviewed the patient's current medications.  Results for orders placed or performed during the hospital encounter of 12/25/18 (from the past 48 hour(s))  Lipase, blood     Status: None   Collection Time: 12/25/18 11:15 AM  Result Value Ref Range   Lipase 15 11 - 51 U/L    Comment: Performed at [facility], [place] [address], [place], [place] [address]  Comprehensive metabolic panel     Status: Abnormal   Collection Time: 12/25/18 11:15 AM  Result Value Ref Range   Sodium 138 135 - 145 mmol/L   Potassium 4.2 3.5 - 5.1 mmol/L   Chloride 99 98 - 111 mmol/L   CO2 22 22 - 32 mmol/L   Glucose, Bld 140 (H) 70 - 99 mg/dL   BUN 55 (H) 6 - 20 mg/dL   Creatinine, Ser 11.09 (H) 0.44 - 1.00 mg/dL   Calcium 8.8 (L) 8.9 - 10.3 mg/dL  Total Protein 7.2 6.5 - 8.1 g/dL   Albumin 2.4 (L) 3.5 - 5.0 g/dL   AST 14 (L) 15 - 41 U/L   ALT 15 0 - 44 U/L   Alkaline Phosphatase 122 38 - 126 U/L   Total Bilirubin 0.8 0.3 - 1.2 mg/dL   GFR calc non Af Amer 4 (L) >60 mL/min   GFR calc Af Amer 4 (L) >60 mL/min   Anion gap 17 (H) 5 - 15    Comment: Performed at [facility] [address], [place], [place] [address]  CBC     Status: Abnormal   Collection Time: 12/25/18 11:15 AM  Result Value Ref Range   WBC 26.4 (H) 4.0 - 10.5 K/uL   RBC 3.90 3.87 - 5.11 MIL/uL   Hemoglobin 10.3 (L) 12.0 - 15.0 g/dL   HCT 33.6 (L) 36.0 - 46.0 %   MCV 86.2 80.0 - 100.0 fL   MCH 26.4 26.0 - 34.0 pg   MCHC 30.7 30.0 - 36.0 g/dL   RDW 14.5 11.5 - 15.5 %   Platelets 453 (H) 150 - 400 K/uL   nRBC 0.0 0.0 - 0.2 %    Comment: Performed at [facility], [place] [address], [place], [place] [address]  Lactic acid, plasma     Status: None   Collection Time: 12/25/18  1:00 PM  Result Value Ref Range   Lactic Acid, Venous 1.6 0.5 - 1.9 mmol/L    Comment: Performed at [facility] [address], [place], [place] [address]    CT Abdomen Pelvis Wo Contrast  Result Date: 12/25/2018 CLINICAL DATA:  Lower back pain radiating to buttocks, hard knot in the sacral region. EXAM: CT ABDOMEN AND PELVIS WITHOUT CONTRAST TECHNIQUE: Multidetector CT imaging of the abdomen and pelvis was performed following the standard protocol without IV contrast. Patient unable to tolerate supine scanning, images obtained in a decubitus positioning. COMPARISON:  CT abdomen pelvis 03/17/2017 FINDINGS: Lower chest: Lung bases are clear. Normal heart size. No pericardial effusion. Hepatobiliary: No focal liver abnormality is seen. Patient is post cholecystectomy. Slight prominence of the biliary tree likely related to reservoir effect. No calcified intraductal gallstones. Pancreas: Unremarkable. No pancreatic ductal dilatation or surrounding inflammatory changes. Spleen: Normal in size without focal abnormality. Adrenals/Urinary Tract: Normal adrenal glands. Increasing nonspecific bilateral perinephric stranding. No visible or concerning renal lesions. No urolithiasis or hydronephrosis. Urinary bladder is unremarkable. Stomach/Bowel: Distal esophagus, stomach and duodenal sweep are unremarkable. No small bowel wall thickening or dilatation. No evidence of obstruction. The appendix is surgically absent. No colonic dilatation or wall thickening. Minimal rectal wall thickening. Vascular/Lymphatic: Atherosclerotic plaque within the normal caliber aorta. No suspicious or enlarged lymph nodes in the included lymphatic chains. Reproductive: Uterus is surgically absent. No concerning adnexal lesions. Suspect retained ovarian tissue. Other: There is extensive soft tissue stranding and phlegmonous change in the superior gluteal cleft extending to the tip of the sacrum and towards the mesorectal fat within ill-defined possible fluid collection along the left lateral aspect of this inflammation measuring approximately 2  x 1 x 1 cm in size. Inflammatory change extends to the levator plate with some hazy intersphincteric stranding which could suggest some transsphincteric extension. No soft tissue gas is seen. No free fluid or air in the abdomen or pelvis. Peritoneal catheter terminates in the left lower quadrant. Musculoskeletal: No features of subjacent osteomyelitis of the sacrum or coccyx. No acute osseous abnormality or suspicious osseous lesion. Mild  levocurvature of the spine is likely positional. IMPRESSION: 1. Extensive stranding and phlegmonous change in the superior gluteal cleft and ischioanal fossa extending to the tip of the sacrum and towards the levator plate within ill-defined possible fluid collection along the left lateral aspect of this measuring approximately 2 x 1 x 1 cm in size possibly reflecting developing abscess. Inflammatory change extends to the levator plate with some hazy intersphincteric stranding which could suggest some transsphincteric extension. No soft tissue gas is seen. No features of subjacent osteomyelitis of the sacrum or coccyx. 2. Increasing bilateral nonspecific perinephric stranding. Could be related to patient's decreased renal function though could consider urinalysis to exclude superimposed urinary tract infection. 3. Peritoneal catheter, likely peritoneal dialysis catheter terminating in the left lower quadrant. 4.  Aortic Atherosclerosis (ICD10-I70.0). Electronically Signed   By: Lovena Le M.D.   On: 12/25/2018 16:15    ROS 10 point review of systems is negative except as listed above in HPI.   Physical Exam Blood pressure (!) 164/73, pulse (!) 117, temperature 98.8 F (37.1 C), temperature source Oral, resp. rate 16, height 5\' 5"  (1.651 m), weight 73.5 kg, SpO2 98 %. Physical Exam Gen: uncomfortable, no acute distress Neuro: non-focal exam HEENT: PERRL Neck: supple CV: RRR Pulm: unlabored breathing Abd: soft, NT GU: spontaneoud voids Rectal: +tone, no blood, no  areas of rectal fluctuance, area of exquisite tenderness and induration of left buttock. Extr: wwp, no edema    Assessment/Plan: 21F s/p mechanical GLF  Sacral phlegmon - recommend continuation of broad-spectrum antibiotics (currently on aztreonam, flagyl, and vancomycin) and continued clinical monitoring. Possibly may be a candidate for I&D with further clinical delineation of fluid collection, however, given the location of the phlegmon and her history of T2DM, would prefer to try to avoid this if possible. Recommend improved pain control with both oral and IV pain medications. Will continue to follow.    Jesusita Oka, MD General and [facility]

## 2018-12-25 NOTE — ED Notes (Signed)
Patient in bed; crying. Stated. "Please I have been laying in this bed, I have so much pain in my back from my fall. Why do they keep scanning my stomach, theres nothing wrong with my stomach". "If y'all are just gonna keep me in pain, then just let me go".Will notify ERMD.

## 2018-12-25 NOTE — ED Notes (Signed)
Patient in bed, upset. C/O needs not being addressed. Stated same medication given earlier with no relief. Asked patient if she can urinate and provide a sample. Patient refused stating "No, I called earlier over and over and they keep ignoring me, I went to the bathroom with no help and I'm not going to give a sample. I will just hold it until I leave" Pt continued to state multiple issues regarding care. Stated that "maybe you have to be rich to be treated better. I will not come back to this ER even if I'm dying of COVID." Informed patient that unfortunately sometimes there are urgent situations in ER and sympathizes with her wait times. Patient continued to cry and stated that "I was here the day before and its all the same"

## 2018-12-25 NOTE — Progress Notes (Addendum)
FPTS Interim Progress Note  S: Went to see Susan Horton for uncontrolled pain. Dr. Reather Laurence at bedside; surgery was consulted for patient's sacral abscess. She is in considerable pain and would like something better than fentanyl.   A/P: Dr. Bobbye Morton does not recommend surgery at this time but will continue with antibiotics and pain control. We will start IV dilaudid with oral oxycodone. Will continue to monitor pain control periodically.  -Start Oxy IR 5mg  q6h PRN for moderate pain -Start IV Dilaudid 1mg  q6h PRN for severe-breakthrough pain -Renal/carb modified diet -Restart ASA and plavix  Susan Horton, Simone, DO 12/25/2018, 8:44 PM PGY-1, [facility] Service pager [phone] ---------------------------------------------------------------------------------- Will opt for dilaudid instead of morphine given ESRD status. Can increase oxy/dil if needed.  Susan Dawn MD PGY-3 Family Medicine Resident

## 2018-12-25 NOTE — ED Provider Notes (Addendum)
[facility] EMERGENCY DEPARTMENT Provider Note   CSN: [id number] Arrival date & time: 12/25/18  1101     History Chief Complaint  Patient presents with  . Back Pain  . Abdominal Pain  . Emesis    Susan Horton is a 48 y.o. female.  HPI Patient reports she has area that is very painful on her buttocks.  She reports there is a lump and it feels hot.  The pain is severe.  He denies she is having abdominal pain.  She does report that she gets gastroparesis so she vomits with some degree of frequency.  She does not feel like any degree of abdominal pain and vomiting is different from what she considers baseline.  Patient reports she does home peritoneal dialysis.  She reports she was only able to do half her treatment last night because she was in so much pain.  She reports she was seen in the emergency department 2 days ago and given some pain medicine but the pain is getting worse.  She reports she thinks the knot in her buttocks is making it hard for her to have a BM and so she does feel somewhat constipated.    Past Medical History:  Diagnosis Date  . Anemia   . Angio-edema   . Asthma   . CAD (coronary artery disease)    DES to mid LAD July 2018, residual moderate RCA disease  . Cataract   . CKD (chronic kidney disease) stage 4, GFR 15-29 ml/min (HCC)    Dialysis T/Th/Sa  . DVT (deep venous thrombosis) ([place])    1996 during pregnancy, 2015 left leg  . Eczema   . Essential hypertension 12/11/2015  . Gastroparesis   . GERD (gastroesophageal reflux disease)   . Hidradenitis   . Migraine   . Neuropathy   . Peripheral vascular disease ([place])    blood clot in leg  . Type 2 diabetes mellitus ([place])   . Type II diabetes mellitus ([place])   . Urticaria     Patient Active Problem List   Diagnosis Date Noted  . Symptomatic anemia 09/16/2018  . Peripheral vascular disease, unspecified ([place]) 09/04/2018  . ESRD on dialysis ([place]) 10/11/2017  . Chronic diastolic CHF  (congestive heart failure) ([place]) 06/18/2017  . Elevated troponin 06/18/2017  . Moderate persistent asthma 06/10/2017  . Adjustment disorder with depressed mood 09/08/2016  . Chest pain 08/29/2016  . CKD (chronic kidney disease) stage 4, GFR 15-29 ml/min (HCC)   . CAD S/P mLAD PCI with DES 08/06/2016  . Presence of drug coated stent in LAD coronary artery 08/06/2016  . Abnormal stress test   . Carotid bruit present 06/15/2016  . Dyslipidemia 06/15/2016  . Smoker 06/15/2016  . Neuropathy 03/10/2016  . Essential hypertension, benign 12/11/2015  . Gastroparesis   . Progressive angina ([place]) 06/05/2014  . Uncontrolled type 2 diabetes mellitus with peripheral neuropathy ([place]) 06/05/2014    Past Surgical History:  Procedure Laterality Date  . A/V FISTULAGRAM N/A 06/22/2017   Procedure: A/V FISTULAGRAM - left arm;  Surgeon: [name], MD;  Location: [facility];  Service: Cardiovascular;  Laterality: N/A;  . ABDOMINAL AORTOGRAM W/LOWER EXTREMITY Bilateral 12/14/2018   Procedure: ABDOMINAL AORTOGRAM W/LOWER EXTREMITY;  Surgeon: [name], MD;  Location: [facility];  Service: Cardiovascular;  Laterality: Bilateral;  . ABDOMINOPLASTY    . ADENOIDECTOMY    . APPENDECTOMY  1995  . AV FISTULA PLACEMENT Left 02/01/2017  Procedure: ARTERIOVENOUS BRACHIOCEPHALIC (AV) FISTULA CREATION;  Surgeon: [name], MD;  Location: [facility];  Service: Vascular;  Laterality: Left;  . CHOLECYSTECTOMY  1993  . CORONARY ANGIOPLASTY WITH STENT PLACEMENT    . CORONARY STENT INTERVENTION N/A 08/05/2016   Procedure: Coronary Stent Intervention;  Surgeon: [name], MD;  Location: [facility];  Service: Cardiovascular;  Laterality: N/A;  . EXPLORATORY LAPAROTOMY  08/14/2005   lysis of adhesions, drainage of tubo-ovarian abscess  . FISTULA SUPERFICIALIZATION Left 09/14/2017   Procedure: FISTULA SUPERFICIALIZATION ARTERIOVENOUS FISTULA LEFT ARM;  Surgeon: [name], MD;  Location: [facility];  Service: Vascular;  Laterality: Left;  . HYDRADENITIS EXCISION  02/08/2011   Procedure: EXCISION HYDRADENITIS GROIN;  Surgeon: [name], MD;  Location: [facility];  Service: General;  Laterality: N/A;  Excisioin of Hidradenitis Left groin  . INGUINAL HIDRADENITIS EXCISION  07/06/2010   bilateral  . INSERTION OF DIALYSIS CATHETER N/A 09/14/2017   Procedure: INSERTION OF TUNNELED DIALYSIS CATHETER Right Internal Jugular;  Surgeon: [name], MD;  Location: [facility];  Service: Vascular;  Laterality: N/A;  . LEFT HEART CATH AND CORONARY ANGIOGRAPHY N/A 08/05/2016   Procedure: Left Heart Cath and Coronary Angiography;  Surgeon: [name], MD;  Location: [facility];  Service: Cardiovascular;  Laterality: N/A;  . PERIPHERAL VASCULAR INTERVENTION Left 12/14/2018   Procedure: PERIPHERAL VASCULAR INTERVENTION;  Surgeon: [name], MD;  Location: [facility];  Service: Cardiovascular;  Laterality: Left;  SFA  . REDUCTION MAMMAPLASTY  2002  . TONSILLECTOMY    . TUBAL LIGATION  1996  . VAGINAL HYSTERECTOMY  08/04/2005   and cysto     OB History   No obstetric history on file.     Family History  Problem Relation Age of Onset  . Allergic rhinitis Mother   . Hypertension Father   . Allergic rhinitis Father   . Hypertension Sister   . Allergic rhinitis Sister   . Hypertension Brother   . Allergic rhinitis Brother   . Breast cancer Cousin        x2    Social History   Tobacco Use  . Smoking status: Former Smoker    Years: 20.00    Types: Cigarettes    Quit date: 11/2016    Years since quitting: 2.1  . Smokeless tobacco: Never Used  Substance Use Topics  . Alcohol use: No    Alcohol/week: 0.0 standard drinks  . Drug use: No    Home Medications Prior to Admission medications   Medication Sig Start Date End Date Taking? Authorizing Provider  albuterol (PROVENTIL HFA;VENTOLIN HFA) 108  (90 BASE) MCG/ACT inhaler Inhale 1-2 puffs into the lungs every 6 (six) hours as needed for wheezing or shortness of breath.     [provider]  amitriptyline (ELAVIL) 10 MG tablet Take 1 tablet (10 mg total) by mouth at bedtime. 09/19/18   Jamse Arn, MD  aspirin 81 MG chewable tablet Chew 1 tablet (81 mg total) by mouth daily. 08/07/16   Cheryln Manly, NP  B Complex-C-Zn-Folic Acid (DIALYVITE 481 WITH ZINC) 0.8 MG TABS Take 1 tablet by mouth daily. 10/06/18   [provider]  calcitRIOL (ROCALTROL) 0.5 MCG capsule Take 1.5 mcg by mouth daily. 09/11/18   [provider]  calcium acetate (PHOSLO) 667 MG capsule Take 1,334-2,001 mg by mouth See admin instructions. Take three capsules (2001 mg) by mouth up to  three times daily with meals and two capsules (1334 mg) with snacks 09/23/17   [provider]  clopidogrel (PLAVIX) 75 MG tablet Take 1 tablet (75 mg total) by mouth daily with breakfast. 10/19/17   Kilroy, Doreene [place], PA-C  Ferrous Sulfate (IRON) 325 (65 Fe) MG TABS Take 325 mg by mouth daily. 11/25/15   Brayton Caves, PA-C  Fluticasone-Salmeterol (ADVAIR) 250-50 MCG/DOSE AEPB Inhale 1 puff into the lungs 2 (two) times daily. Patient taking differently: Inhale 1 puff into the lungs daily as needed (shortness of breath).  06/10/17   Charlott Rakes, MD  gentamicin cream (GARAMYCIN) 0.1 % Apply 1 application topically See admin instructions. Apply to access site nightly 09/11/18   [provider]  HYDROcodone-acetaminophen (NORCO/VICODIN) 5-325 MG tablet Take 1 tablet by mouth every 6 (six) hours as needed for severe pain. 12/23/18   Recardo Evangelist, PA-C  Insulin Glargine, 1 Unit Dial, (TOUJEO SOLOSTAR) 300 UNIT/ML SOPN Inject 25 Units into the skin 2 (two) times a day. Patient taking differently: Inject 50 Units into the skin 2 (two) times a day.  05/22/18   Philemon Kingdom, MD  insulin regular (HUMULIN R) 100 units/mL injection Inject 0.2-0.25  mLs (20-25 Units total) into the skin See admin instructions. Patient taking differently: Inject 7-15 Units into the skin 3 (three) times daily before meals. Per CBG 09/22/17   Philemon Kingdom, MD  isosorbide mononitrate (IMDUR) 30 MG 24 hr tablet Take 0.5 tablets (15 mg total) by mouth daily. 10/19/17 12/12/18  Erlene Quan, PA-C  lidocaine-prilocaine (EMLA) cream Apply 1 application topically See admin instructions. Apply small amount to access site (AVF) 30 minutes before hemodialysis, cover with occlusive dressing (Saran Wrap) if going out to dialysis center 09/15/17   [provider]  lovastatin (MEVACOR) 20 MG tablet Take 20 mg by mouth at bedtime.     [provider]  methocarbamol (ROBAXIN) 500 MG tablet Take 1 tablet (500 mg total) by mouth 2 (two) times daily as needed for muscle spasms. 09/07/17   Jamse Arn, MD  metoCLOPramide (REGLAN) 5 MG tablet TAKE 1 TABLET BY MOUTH 4 TIMES DAILY BEFORE MEALS AND AT BEDTIME. Patient taking differently: Take 5 mg by mouth 4 (four) times daily -  before meals and at bedtime.  03/18/17   [name], MD  metoprolol succinate (TOPROL-XL) 50 MG 24 hr tablet Take 1 tablet (50 mg total) by mouth daily. 03/14/18   [name], MD  montelukast (SINGULAIR) 10 MG tablet Take 1 tablet (10 mg total) by mouth at bedtime. Patient taking differently: Take 10 mg by mouth at bedtime as needed (allergies).  04/12/18   Kennith Gain, MD  mupirocin cream (BACTROBAN) 2 % Apply 1 application topically 2 (two) times daily as needed. To affected areas and cover with non-stick gauze. 11/17/18   Katy Apo, NP  nitroGLYCERIN (NITROSTAT) 0.4 MG SL tablet Place 1 tablet (0.4 mg total) under the tongue every 5 (five) minutes as needed for chest pain. 10/19/17   Erlene Quan, PA-C  pantoprazole (PROTONIX) 40 MG tablet TAKE 1 TABLET BY MOUTH DAILY. Patient taking differently: Take 40 mg by mouth daily.  02/14/18   [name], MD    pregabalin (LYRICA) 75 MG capsule Take 1 capsule (75 mg total) by mouth daily. 09/19/18   Jamse Arn, MD  senna-docusate (SENOKOT-S) 8.6-50 MG tablet Take 1 tablet by mouth 2 (two) times daily. Patient taking differently: Take 1 tablet  by mouth daily as needed for moderate constipation.  08/30/16   Geradine Girt, DO  traMADol (ULTRAM) 50 MG tablet Take 1 tablet (50 mg total) by mouth every 12 (twelve) hours as needed for severe pain. 11/17/18   Katy Apo, NP  trimethoprim-polymyxin b (POLYTRIM) ophthalmic solution Place 1 drop into both eyes See admin instructions. Instill one drop into both eyes four times daily for 2 days after each monthly eye injection 06/16/17   [provider]    Allergies    Penicillins, Repatha [evolocumab], and Lisinopril  Review of Systems   Review of Systems 10 Systems reviewed and are negative for acute change except as noted in the HPI.  Physical Exam Updated Vital Signs BP (!) 164/73 (BP Location: Right Arm)   Pulse (!) 117   Temp 98.8 F (37.1 C) (Oral)   Resp 16   Ht 5\' 5"  (1.651 m)   Wt 73.5 kg   SpO2 98%   BMI 26.96 kg/m   Physical Exam Constitutional:      Comments: Patient is alert.  Nontoxic.  Tearful and in pain.  No respiratory distress.  HENT:     Head: Normocephalic and atraumatic.  Eyes:     Extraocular Movements: Extraocular movements intact.  Cardiovascular:     Rate and Rhythm: Normal rate and regular rhythm.  Pulmonary:     Effort: Pulmonary effort is normal.     Breath sounds: Normal breath sounds.  Abdominal:     General: There is no distension.     Palpations: Abdomen is soft.     Tenderness: There is no abdominal tenderness. There is no guarding.  Genitourinary:    Comments: Patient has a firm indurated area on the left gluteal cleft that appears to be about 6 cm.  This is exquisitely tender.  Not adjacent to the anus. Musculoskeletal:        General: No swelling or tenderness. Normal range of  motion.     Left lower leg: No edema.  Skin:    General: Skin is warm and dry.  Neurological:     General: No focal deficit present.     Mental Status: She is oriented to person, place, and time.     Coordination: Coordination normal.     ED Results / Procedures / Treatments   Labs (all labs ordered are listed, but only abnormal results are displayed) Labs Reviewed  COMPREHENSIVE METABOLIC PANEL - Abnormal; Notable for the following components:      Result Value   Glucose, Bld 140 (*)    BUN 55 (*)    Creatinine, Ser 11.09 (*)    Calcium 8.8 (*)    Albumin 2.4 (*)    AST 14 (*)    GFR calc non Af Amer 4 (*)    GFR calc Af Amer 4 (*)    Anion gap 17 (*)    All other components within normal limits  CBC - Abnormal; Notable for the following components:   WBC 26.4 (*)    Hemoglobin 10.3 (*)    HCT 33.6 (*)    Platelets 453 (*)    All other components within normal limits  CULTURE, BLOOD (ROUTINE X 2)  CULTURE, BLOOD (ROUTINE X 2)  SARS CORONAVIRUS 2 (TAT 6-24 HRS)  LIPASE, BLOOD  LACTIC ACID, PLASMA  URINALYSIS, ROUTINE W REFLEX MICROSCOPIC  LACTIC ACID, PLASMA    EKG None  Radiology CT Abdomen Pelvis Wo Contrast  Result Date: 12/25/2018 CLINICAL DATA:  Lower back pain radiating to buttocks, hard knot in the sacral region. EXAM: CT ABDOMEN AND PELVIS WITHOUT CONTRAST TECHNIQUE: Multidetector CT imaging of the abdomen and pelvis was performed following the standard protocol without IV contrast. Patient unable to tolerate supine scanning, images obtained in a decubitus positioning. COMPARISON:  CT abdomen pelvis 03/17/2017 FINDINGS: Lower chest: Lung bases are clear. Normal heart size. No pericardial effusion. Hepatobiliary: No focal liver abnormality is seen. Patient is post cholecystectomy. Slight prominence of the biliary tree likely related to reservoir effect. No calcified intraductal gallstones. Pancreas: Unremarkable. No pancreatic ductal dilatation or surrounding  inflammatory changes. Spleen: Normal in size without focal abnormality. Adrenals/Urinary Tract: Normal adrenal glands. Increasing nonspecific bilateral perinephric stranding. No visible or concerning renal lesions. No urolithiasis or hydronephrosis. Urinary bladder is unremarkable. Stomach/Bowel: Distal esophagus, stomach and duodenal sweep are unremarkable. No small bowel wall thickening or dilatation. No evidence of obstruction. The appendix is surgically absent. No colonic dilatation or wall thickening. Minimal rectal wall thickening. Vascular/Lymphatic: Atherosclerotic plaque within the normal caliber aorta. No suspicious or enlarged lymph nodes in the included lymphatic chains. Reproductive: Uterus is surgically absent. No concerning adnexal lesions. Suspect retained ovarian tissue. Other: There is extensive soft tissue stranding and phlegmonous change in the superior gluteal cleft extending to the tip of the sacrum and towards the mesorectal fat within ill-defined possible fluid collection along the left lateral aspect of this inflammation measuring approximately 2 x 1 x 1 cm in size. Inflammatory change extends to the levator plate with some hazy intersphincteric stranding which could suggest some transsphincteric extension. No soft tissue gas is seen. No free fluid or air in the abdomen or pelvis. Peritoneal catheter terminates in the left lower quadrant. Musculoskeletal: No features of subjacent osteomyelitis of the sacrum or coccyx. No acute osseous abnormality or suspicious osseous lesion. Mild levocurvature of the spine is likely positional. IMPRESSION: 1. Extensive stranding and phlegmonous change in the superior gluteal cleft and ischioanal fossa extending to the tip of the sacrum and towards the levator plate within ill-defined possible fluid collection along the left lateral aspect of this measuring approximately 2 x 1 x 1 cm in size possibly reflecting developing abscess. Inflammatory change extends  to the levator plate with some hazy intersphincteric stranding which could suggest some transsphincteric extension. No soft tissue gas is seen. No features of subjacent osteomyelitis of the sacrum or coccyx. 2. Increasing bilateral nonspecific perinephric stranding. Could be related to patient's decreased renal function though could consider urinalysis to exclude superimposed urinary tract infection. 3. Peritoneal catheter, likely peritoneal dialysis catheter terminating in the left lower quadrant. 4.  Aortic Atherosclerosis (ICD10-I70.0). Electronically Signed   By: Lovena Le M.D.   On: 12/25/2018 16:15    Procedures Procedures (including critical care time)  Medications Ordered in ED Medications  HYDROmorphone (DILAUDID) injection 1 mg (has no administration in time range)  vancomycin variable dose per unstable renal function (pharmacist dosing) (has no administration in time range)  aztreonam (AZACTAM) 0.5 g in dextrose 5 % 50 mL IVPB (0.5 g Intravenous New Bag/Given 12/25/18 1720)  vancomycin (VANCOCIN) 2,000 mg in sodium chloride 0.9 % 500 mL IVPB (has no administration in time range)  sodium chloride flush (NS) 0.9 % injection 3 mL (3 mLs Intravenous Given 12/25/18 1311)  HYDROmorphone (DILAUDID) injection 1 mg (1 mg Intravenous Given 12/25/18 1311)  HYDROmorphone (DILAUDID) injection 1 mg (1 mg Intravenous Given 12/25/18 1559)    ED Course  I have reviewed the triage vital signs  and the nursing notes.  Pertinent labs & imaging results that were available during my care of the patient were reviewed by me and considered in my medical decision making (see chart for details).  Clinical Course as of Dec 25 1718  Mon Dec 25, 2018  1720 Consult: Reviewed with family medicine for admission.   [MP]    Clinical Course User Index [MP] Charlesetta Shanks, MD   MDM Rules/Calculators/A&P                     Patient presents with severe pain in the sacral area and a focal area of swelling and  induration by the gluteal cleft.  26,000 white count.  No fever.  No lactic acidosis.  Patient does have risk factors of being on peritoneal dialysis.  CT scan shows extensive soft tissue stranding toward the mesorectal fat and ill-defined possible fluid collection.  Patient has extensive and severe pain involving the sacral area.  Patient will require inpatient antibiotics and monitoring.  At this time, clinically I cannot identify a area of superficial four incision and drainage.  CT identifies an ill-defined collection.  She will need close monitoring for improvement. Final Clinical Impression(s) / ED Diagnoses Final diagnoses:  None    Rx / DC Orders ED Discharge Orders    None       Charlesetta Shanks, MD 12/25/18 1703    Charlesetta Shanks, MD 12/25/18 1721

## 2018-12-25 NOTE — ED Triage Notes (Signed)
Pt reports right buttocks pain, nausea, vomiting, abdominal pain and can't poop. Pt appears distressed due to pain. Seen here for same 2 days ago. VSS.

## 2018-12-25 NOTE — H&P (Addendum)
[facility] Admission History and Physical Service Pager: [phone]  Patient name: Susan Horton Medical record number: [id number] Date of birth: February 12, 1970 Age: 48 y.o. Gender: female  Primary Care Provider: Charlott Rakes, MD Consultants: None Code Status: Full   Chief Complaint: Pain in her lower back and butt  Assessment and Plan: Susan Horton is a 48 y.o. female presenting with sacral phlegmon. PMH is significant for ESRD on home PD, T2DM, HTN, CHF, PVD.  Sacral phlegmon Patient fell approximately a week ago and has had pain in her sacral area since that time.  She was seen on 12/12 in the ED and had a fall work-up done and was given pain medicine and discharged.  Patient return for worsening pain and increased heat and swelling.  In the ED patient was afebrile, WBC-24.6, CT abdomen and pelvis showed extensive stranding and phlegmonous changes in the superior gluteal cleft and ischio anal fossa extending to the tip of the sacrum and towards the levator plate with an ill-defined possible fluid collection along the left lateral aspect of this measuring 2X to X 1 cm in size possibly reflecting a developing abscess.  Blood cultures were drawn, pharmacy was consulted given PCN allergy and recommended adding vancomycin, metronidazole, and aztreonam. Gen surgery was consulted on admission and plans to see patient and give further recommendations. Until then, will keep patient NPO and hold home antiplatelets for possible surgery.  -Admit to med surg with Dr. Nori Riis as attending -Follow-up on blood cultures -Vancomycin, metronidazole, aztreonam per pharmacy recs (12/14-) -Surgery consult, appreciate recommendations -Fentanyl 12.5 mcg every 2 hours as needed, will re-assess after first dose to ensure pain control. Patient reports dilaudid in ED was not helping  -Tylenol every 6 hours scheduled -Vitals per routine -N.p.o. at this time until we hear from surgery -Holding  aspirin and Plavix at this time given patient may go to surgery -Gen surgery consulted, appreciate recommendations  ESRD on home PD Patient gets nighttime home peritoneal dialysis.  Home medications include PhosLo, calcitriol -Nephrology consulted, appreciate recommendations -Continue home medications  CAD Follows with [facility]. Patient has history of CAD and is on aspirin, Plavix and metoprolol.  Patient had drug-eluting stent placed in 2018. -Holding anticoagulation at this time till we hear from surgery -Continue home metoprolol  PAD Patient has history of significant peripheral artery disease.  She is status post right SFA intervention by Dr. Carlis Abbott on 12/14/2018.  Medications include Plavix, aspirin, lovastatin 20 mg daily -Holding home anticoagulation drugs -To new home lovastatin 20 mg  HFpEF Acute on chronic diastolic heart failure as listed in the patient's problem list and addressed in cardiology notes.  Patient recently had an echocardiogram as of 12/11 which showed an EF of 60 to 65% with normal left ventricular normal right ventricular function.  Echo from 2019 showed EF of 55 to 60% with G2DD.  Home medications as metoprolol XL -Monitor signs and symptoms of heart failure -Continue home metoprolol XL dose  T2 DM with peripheral neuropathy Patient's home medications include Toujeo 25 units twice daily and humulin SSI.  Hemoglobin A1c on 09/14/2017 was 8.3.  She also takes Lyrica for nerve pain.  Patient also takes amitriptyline 10 mg at bedtime. -Repeat hemoglobin A1c -Holding home Toujeo -Sensitive sliding scale -Continue home Lyrica and amitriptyline -q4h CBGs while NPO  HTN Pressure since arrival have ranged from 151/127-168/58.  Patient's home medications include Toprol-XL 50 mg daily -Continue home medications  Asthma Home medications  include Singulair PRN at bedtime, Advair twice daily and Ventolin as needed. -Continue home asthma  medications -holding singulair as this is PRN  FEN/GI: NPO pending surg eval Prophylaxis: SCDs  Disposition: Admit to inpatient MedSurg  History of Present Illness:  Susan Horton is a 48 y.o. female presenting with sacral pain.  Patient is extremely upset only into the room. Patient reports that she fell approximately a week ago after tripping on a vacuum cord and landed on her sacrum.  She reported significant pain that increased over the week.  Patient came to the emergency department a few "a few days ago and they did nothing".  States it was hot to the touch. x1 week.  Patient reports she cannot sleep or eat because of the pain.  Patient denies fever, chills, nausea.  Patient reports she vomits every day due to gastroparesis.  Patient reports she has difficulty having bowel movement since the pain is so severe.  Patient still makes urine and has no issues urinating.  She uses nightly peritoneal dialysis.  Patient was given tramadol in the ED on previous visit and she says that that helps very little.  She also takes Tylenol as prescribed with little relief.  She also uses heating pad.  Says that she initially used ice and heat but has recently been using heat only.  Patient reports that the pain is acting her ability to walk because it hurts so bad.  She reports that she has never had this happen before but that she has had issues with abscesses in her leg which required a surgeon to incision and drain.   Can walk a little but not much. Has never had this happen before. Has had issues with abbsesses in her leg. Was seeing Dr. Thera Flake. "he cut it and drained it". He was gen surg.   In ED WBC count was elevated to 26 and CT abd showing phlegmonous change. IV vanc and aztreonam were initiated and FPTS was called for admission.   Review Of Systems: Per HPI with the following additions:   Review of Systems  Constitutional: Negative for chills, diaphoresis and fever.  Respiratory: Negative  for cough and shortness of breath.   Cardiovascular: Negative for chest pain.  Gastrointestinal: Positive for constipation and vomiting. Negative for abdominal pain, diarrhea and nausea.  Genitourinary: Negative for dysuria, frequency and urgency.  Musculoskeletal: Positive for falls.       Sacral pain  Skin: Negative for rash.    Patient Active Problem List   Diagnosis Date Noted  . Symptomatic anemia 09/16/2018  . Peripheral vascular disease, unspecified ([place]) 09/04/2018  . ESRD on dialysis ([place]) 10/11/2017  . Chronic diastolic CHF (congestive heart failure) ([place]) 06/18/2017  . Elevated troponin 06/18/2017  . Moderate persistent asthma 06/10/2017  . Adjustment disorder with depressed mood 09/08/2016  . Chest pain 08/29/2016  . CKD (chronic kidney disease) stage 4, GFR 15-29 ml/min (HCC)   . CAD S/P mLAD PCI with DES 08/06/2016  . Presence of drug coated stent in LAD coronary artery 08/06/2016  . Abnormal stress test   . Carotid bruit present 06/15/2016  . Dyslipidemia 06/15/2016  . Smoker 06/15/2016  . Neuropathy 03/10/2016  . Essential hypertension, benign 12/11/2015  . Gastroparesis   . Progressive angina ([place]) 06/05/2014  . Uncontrolled type 2 diabetes mellitus with peripheral neuropathy ([place]) 06/05/2014    Past Medical History: Past Medical History:  Diagnosis Date  . Anemia   . Angio-edema   . Asthma   .  CAD (coronary artery disease)    DES to mid LAD July 2018, residual moderate RCA disease  . Cataract   . CKD (chronic kidney disease) stage 4, GFR 15-29 ml/min (HCC)    Dialysis T/Th/Sa  . DVT (deep venous thrombosis) ([place])    1996 during pregnancy, 2015 left leg  . Eczema   . Essential hypertension 12/11/2015  . Gastroparesis   . GERD (gastroesophageal reflux disease)   . Hidradenitis   . Migraine   . Neuropathy   . Peripheral vascular disease ([place])    blood clot in leg  . Type 2 diabetes mellitus ([place])   . Type II diabetes mellitus ([place])   . Urticaria      Past Surgical History: Past Surgical History:  Procedure Laterality Date  . A/V FISTULAGRAM N/A 06/22/2017   Procedure: A/V FISTULAGRAM - left arm;  Surgeon: [name], MD;  Location: [facility];  Service: Cardiovascular;  Laterality: N/A;  . ABDOMINAL AORTOGRAM W/LOWER EXTREMITY Bilateral 12/14/2018   Procedure: ABDOMINAL AORTOGRAM W/LOWER EXTREMITY;  Surgeon: [name], MD;  Location: [facility];  Service: Cardiovascular;  Laterality: Bilateral;  . ABDOMINOPLASTY    . ADENOIDECTOMY    . APPENDECTOMY  1995  . AV FISTULA PLACEMENT Left 02/01/2017   Procedure: ARTERIOVENOUS BRACHIOCEPHALIC (AV) FISTULA CREATION;  Surgeon: [name], MD;  Location: [facility];  Service: Vascular;  Laterality: Left;  . CHOLECYSTECTOMY  1993  . CORONARY ANGIOPLASTY WITH STENT PLACEMENT    . CORONARY STENT INTERVENTION N/A 08/05/2016   Procedure: Coronary Stent Intervention;  Surgeon: [name], MD;  Location: [facility];  Service: Cardiovascular;  Laterality: N/A;  . EXPLORATORY LAPAROTOMY  08/14/2005   lysis of adhesions, drainage of tubo-ovarian abscess  . FISTULA SUPERFICIALIZATION Left 09/14/2017   Procedure: FISTULA SUPERFICIALIZATION ARTERIOVENOUS FISTULA LEFT ARM;  Surgeon: [name], MD;  Location: [facility];  Service: Vascular;  Laterality: Left;  . HYDRADENITIS EXCISION  02/08/2011   Procedure: EXCISION HYDRADENITIS GROIN;  Surgeon: [name], MD;  Location: [facility];  Service: General;  Laterality: N/A;  Excisioin of Hidradenitis Left groin  . INGUINAL HIDRADENITIS EXCISION  07/06/2010   bilateral  . INSERTION OF DIALYSIS CATHETER N/A 09/14/2017   Procedure: INSERTION OF TUNNELED DIALYSIS CATHETER Right Internal Jugular;  Surgeon: [name], MD;  Location: [facility];  Service: Vascular;  Laterality: N/A;  . LEFT HEART CATH AND CORONARY ANGIOGRAPHY N/A 08/05/2016   Procedure: Left Heart Cath and Coronary  Angiography;  Surgeon: [name], MD;  Location: [facility];  Service: Cardiovascular;  Laterality: N/A;  . PERIPHERAL VASCULAR INTERVENTION Left 12/14/2018   Procedure: PERIPHERAL VASCULAR INTERVENTION;  Surgeon: [name], MD;  Location: [facility];  Service: Cardiovascular;  Laterality: Left;  SFA  . REDUCTION MAMMAPLASTY  2002  . TONSILLECTOMY    . TUBAL LIGATION  1996  . VAGINAL HYSTERECTOMY  08/04/2005   and cysto    Social History: Social History   Tobacco Use  . Smoking status: Former Smoker    Years: 20.00    Types: Cigarettes    Quit date: 11/2016    Years since quitting: 2.1  . Smokeless tobacco: Never Used  Substance Use Topics  . Alcohol use: No    Alcohol/week: 0.0 standard drinks  . Drug use: No  Used to smoke cigarettes, quit 3 years ago Uses edibles every other day to eat Lives with friend,  sister, and kids check on her  Does all ADLs independently   Family History: Family History  Problem Relation Age of Onset  . Allergic rhinitis Mother   . Hypertension Father   . Allergic rhinitis Father   . Hypertension Sister   . Allergic rhinitis Sister   . Hypertension Brother   . Allergic rhinitis Brother   . Breast cancer Cousin        x2    Allergies and Medications: Allergies  Allergen Reactions  . Penicillins Anaphylaxis, Itching, Swelling, Rash and Other (See Comments)    Swelling of throat & whole mouth  Has patient had a PCN reaction causing immediate rash, facial/tongue/throat swelling, SOB or lightheadedness with hypotension: Yes Has patient had a PCN reaction causing severe rash involving mucus membranes or skin necrosis: No Has patient had a PCN reaction that required hospitalization: No Has patient had a PCN reaction occurring within the last 10 years: No If all of the above answers are "NO", then may proceed with Cephalosporin use. Swelling of throat & whole mouth -angioedema Has patient had a PCN reaction  causing immediate rash, facial/tongue/throat swelling, SOB or lightheadedness with hypotension: Yes Has patient had a PCN reaction causing severe rash involving mucus membranes or skin necrosis: No Has patient had a PCN reaction that required hospitalization: No Has patient had a PCN reaction occurring within the last 10 years: No If all of the above answers are "NO", then may proceed with Cephalosporin use. Swelling of th Swelling of throat & whole mouth  Has patient had a PCN reaction causing immediate rash, facial/tongue/throat swelling, SOB or lightheadedness with hypotension: Yes Has patient had a PCN reaction causing severe rash involving mucus membranes or skin necrosis: No Has patient had a PCN reaction that required hospitalization: No Has patient had a PCN reaction occurring within the last 10 years: No If all of the above answers are "NO", then may proceed with Cephalosporin use. Swelling of tongue itching and rash   . Repatha [Evolocumab] Itching  . Lisinopril Cough   No current facility-administered medications on file prior to encounter.   Current Outpatient Medications on File Prior to Encounter  Medication Sig Dispense Refill  . albuterol (PROVENTIL HFA;VENTOLIN HFA) 108 (90 BASE) MCG/ACT inhaler Inhale 1-2 puffs into the lungs every 6 (six) hours as needed for wheezing or shortness of breath.     [name] amitriptyline (ELAVIL) 10 MG tablet Take 1 tablet (10 mg total) by mouth at bedtime. 30 tablet 1  . aspirin 81 MG chewable tablet Chew 1 tablet (81 mg total) by mouth daily. 30 tablet 0  . B Complex-C-Zn-Folic Acid (DIALYVITE 408 WITH ZINC) 0.8 MG TABS Take 1 tablet by mouth daily.    . calcitRIOL (ROCALTROL) 0.5 MCG capsule Take 1.5 mcg by mouth daily.    . calcium acetate (PHOSLO) 667 MG capsule Take 1,334-2,001 mg by mouth See admin instructions. Take three capsules (2001 mg) by mouth up to three times daily with meals and two capsules (1334 mg) with snacks  10  . clopidogrel  (PLAVIX) 75 MG tablet Take 1 tablet (75 mg total) by mouth daily with breakfast. 90 tablet 3  . Ferrous Sulfate (IRON) 325 (65 Fe) MG TABS Take 325 mg by mouth daily. 30 each 3  . Fluticasone-Salmeterol (ADVAIR) 250-50 MCG/DOSE AEPB Inhale 1 puff into the lungs 2 (two) times daily. (Patient taking differently: Inhale 1 puff into the lungs daily as needed (shortness of breath). ) 60 each 3  .  gentamicin cream (GARAMYCIN) 0.1 % Apply 1 application topically See admin instructions. Apply to access site nightly    . HYDROcodone-acetaminophen (NORCO/VICODIN) 5-325 MG tablet Take 1 tablet by mouth every 6 (six) hours as needed for severe pain. 10 tablet 0  . Insulin Glargine, 1 Unit Dial, (TOUJEO SOLOSTAR) 300 UNIT/ML SOPN Inject 25 Units into the skin 2 (two) times a day. (Patient taking differently: Inject 50 Units into the skin 2 (two) times a day. ) 12 pen 3  . insulin regular (HUMULIN R) 100 units/mL injection Inject 0.2-0.25 mLs (20-25 Units total) into the skin See admin instructions. (Patient taking differently: Inject 7-15 Units into the skin 3 (three) times daily before meals. Per CBG) 45 mL 3  . isosorbide mononitrate (IMDUR) 30 MG 24 hr tablet Take 0.5 tablets (15 mg total) by mouth daily. 45 tablet 3  . lidocaine-prilocaine (EMLA) cream Apply 1 application topically See admin instructions. Apply small amount to access site (AVF) 30 minutes before hemodialysis, cover with occlusive dressing (Saran Wrap) if going out to dialysis center  5  . lovastatin (MEVACOR) 20 MG tablet Take 20 mg by mouth at bedtime.     . methocarbamol (ROBAXIN) 500 MG tablet Take 1 tablet (500 mg total) by mouth 2 (two) times daily as needed for muscle spasms. 60 tablet 1  . metoCLOPramide (REGLAN) 5 MG tablet TAKE 1 TABLET BY MOUTH 4 TIMES DAILY BEFORE MEALS AND AT BEDTIME. (Patient taking differently: Take 5 mg by mouth 4 (four) times daily -  before meals and at bedtime. ) 90 tablet 0  . metoprolol succinate (TOPROL-XL)  50 MG 24 hr tablet Take 1 tablet (50 mg total) by mouth daily. 90 tablet 3  . montelukast (SINGULAIR) 10 MG tablet Take 1 tablet (10 mg total) by mouth at bedtime. (Patient taking differently: Take 10 mg by mouth at bedtime as needed (allergies). ) 30 tablet 5  . mupirocin cream (BACTROBAN) 2 % Apply 1 application topically 2 (two) times daily as needed. To affected areas and cover with non-stick gauze. 15 g 0  . nitroGLYCERIN (NITROSTAT) 0.4 MG SL tablet Place 1 tablet (0.4 mg total) under the tongue every 5 (five) minutes as needed for chest pain. 25 tablet 2  . pantoprazole (PROTONIX) 40 MG tablet TAKE 1 TABLET BY MOUTH DAILY. (Patient taking differently: Take 40 mg by mouth daily. ) 30 tablet 7  . pregabalin (LYRICA) 75 MG capsule Take 1 capsule (75 mg total) by mouth daily. 30 capsule 1  . senna-docusate (SENOKOT-S) 8.6-50 MG tablet Take 1 tablet by mouth 2 (two) times daily. (Patient taking differently: Take 1 tablet by mouth daily as needed for moderate constipation. )    . traMADol (ULTRAM) 50 MG tablet Take 1 tablet (50 mg total) by mouth every 12 (twelve) hours as needed for severe pain. 10 tablet 0  . trimethoprim-polymyxin b (POLYTRIM) ophthalmic solution Place 1 drop into both eyes See admin instructions. Instill one drop into both eyes four times daily for 2 days after each monthly eye injection  12    Objective: BP (!) 151/127 (BP Location: Right Arm)   Pulse 71   Temp 98.8 F (37.1 C) (Oral)   Resp (!) 24   Ht 5\' 5"  (1.651 m)   Wt 73.5 kg   SpO2 97%   BMI 26.96 kg/m  Physical Exam  Constitutional: She appears distressed.  HENT:  Head: Normocephalic and atraumatic.  Eyes: Pupils are equal, round, and reactive to light. EOM  are normal.  Cardiovascular: Normal rate, regular rhythm and normal heart sounds.  Pulmonary/Chest: Effort normal and breath sounds normal. No respiratory distress. She has no wheezes. She has no rales.  Abdominal: Soft. Bowel sounds are normal.   Peritoneal dialysis catheter in place.  Skin around is nonerythematous, nontender  Musculoskeletal:        General: Tenderness present. No edema.     Cervical back: Normal range of motion and neck supple.     Comments: Patient has an area in sacral region that is warm to touch as well as extremely painful.  Skin is tight.  Difficult to palpate defined abscess  Lymphadenopathy:    She has no cervical adenopathy.  Skin: Skin is warm and dry. She is not diaphoretic.    Labs and Imaging: CBC BMET  Recent Labs  Lab 12/25/18 1115  WBC 26.4*  HGB 10.3*  HCT 33.6*  PLT 453*   Recent Labs  Lab 12/25/18 1115  NA 138  K 4.2  CL 99  CO2 22  BUN 55*  CREATININE 11.09*  GLUCOSE 140*  CALCIUM 8.8*    Lactic acid-1.6 Blood culture pending  CT Abdomen Pelvis Wo Contrast  Result Date: 12/25/2018 CLINICAL DATA:  Lower back pain radiating to buttocks, hard knot in the sacral region. EXAM: CT ABDOMEN AND PELVIS WITHOUT CONTRAST TECHNIQUE: Multidetector CT imaging of the abdomen and pelvis was performed following the standard protocol without IV contrast. Patient unable to tolerate supine scanning, images obtained in a decubitus positioning. COMPARISON:  CT abdomen pelvis 03/17/2017 FINDINGS: Lower chest: Lung bases are clear. Normal heart size. No pericardial effusion. Hepatobiliary: No focal liver abnormality is seen. Patient is post cholecystectomy. Slight prominence of the biliary tree likely related to reservoir effect. No calcified intraductal gallstones. Pancreas: Unremarkable. No pancreatic ductal dilatation or surrounding inflammatory changes. Spleen: Normal in size without focal abnormality. Adrenals/Urinary Tract: Normal adrenal glands. Increasing nonspecific bilateral perinephric stranding. No visible or concerning renal lesions. No urolithiasis or hydronephrosis. Urinary bladder is unremarkable. Stomach/Bowel: Distal esophagus, stomach and duodenal sweep are unremarkable. No small bowel  wall thickening or dilatation. No evidence of obstruction. The appendix is surgically absent. No colonic dilatation or wall thickening. Minimal rectal wall thickening. Vascular/Lymphatic: Atherosclerotic plaque within the normal caliber aorta. No suspicious or enlarged lymph nodes in the included lymphatic chains. Reproductive: Uterus is surgically absent. No concerning adnexal lesions. Suspect retained ovarian tissue. Other: There is extensive soft tissue stranding and phlegmonous change in the superior gluteal cleft extending to the tip of the sacrum and towards the mesorectal fat within ill-defined possible fluid collection along the left lateral aspect of this inflammation measuring approximately 2 x 1 x 1 cm in size. Inflammatory change extends to the levator plate with some hazy intersphincteric stranding which could suggest some transsphincteric extension. No soft tissue gas is seen. No free fluid or air in the abdomen or pelvis. Peritoneal catheter terminates in the left lower quadrant. Musculoskeletal: No features of subjacent osteomyelitis of the sacrum or coccyx. No acute osseous abnormality or suspicious osseous lesion. Mild levocurvature of the spine is likely positional. IMPRESSION: 1. Extensive stranding and phlegmonous change in the superior gluteal cleft and ischioanal fossa extending to the tip of the sacrum and towards the levator plate within ill-defined possible fluid collection along the left lateral aspect of this measuring approximately 2 x 1 x 1 cm in size possibly reflecting developing abscess. Inflammatory change extends to the levator plate with some hazy intersphincteric stranding which could  suggest some transsphincteric extension. No soft tissue gas is seen. No features of subjacent osteomyelitis of the sacrum or coccyx. 2. Increasing bilateral nonspecific perinephric stranding. Could be related to patient's decreased renal function though could consider urinalysis to exclude  superimposed urinary tract infection. 3. Peritoneal catheter, likely peritoneal dialysis catheter terminating in the left lower quadrant. 4.  Aortic Atherosclerosis (ICD10-I70.0). Electronically Signed   By: Lovena Le M.D.   On: 12/25/2018 16:15    Gifford Shave, MD 12/25/2018, 5:16 PM PGY-1, [facility] pager: [phone], text pages welcome   FPTS Upper-Level Resident Addendum   I have independently interviewed and examined the patient. I have discussed the above with the original author and agree with their documentation. My edits for correction/addition/clarification are in blue. Please see also any attending notes.   Caroline More, DO PGY-3, [facility] 12/25/2018 8:46 PM  [facility] pager: [phone] (text pages welcome through [facility])

## 2018-12-25 NOTE — ED Notes (Signed)
Nurse unable to take report at this time.

## 2018-12-25 NOTE — ED Notes (Signed)
Pt walking out door. Encouraged to stay. Stays for CT after long talk.

## 2018-12-25 NOTE — Consult Note (Signed)
Reason for Consult: ESRD Referring Physician:  Dr. Tammi Klippel  Chief Complaint:  Pain in the buttocks  Assessment/Plan: 1. ESRD - on PD followed by Dr. Jimmy Footman CCPD 4 exchanges with 2L at night 2 1.5% and 1 2.5% with 5th fill of 1L which she drains at midday. - She appears clinically and by history to be dry; will only use 1.5% once she's in a room. - No e/o peritonitis. 2. HTN - resume oral anti-hypertensives 3. Renal osteodystrophy - on Phoslo -> will check a phos in the AM 4. Anemia - at goal 5. Abscess - surgery consulted and on empiric abx for now w/ WBC of 26.4K 6. DM with neuropathy 7. CHF - compensated    HPI: Susan Horton is an 48 y.o. female HTN CHF CASHD, DM peripheral neuropathy ESRD on PD for 1 year followed by Dr. Jimmy Footman presenting with sacral cellulitis for a week. Pain was seen in ED recently with fall w/u but p/w worsening pain in the buttocks with swelling, tenderness to touch but denies fever, chills, discharge or diarrhea. She hasn't been using the restroom regularly in the past week. On CT AP there were phlogmonous changesi n the superior gluteal cleft and ischio anal fossa with a possible fluid collection as well. Blood cultures were drawn and pt started on empiric abx with Vanco, Aztreonam. She has not been consuming much PO's and is thirsty. She usually has 4 exchanges at night with a 5th fill with only 1L which she drains at midday. She states that the fluid has been clear and there is no abdominal pain. No reported issues with fill or drain.  ROS Pertinent items are noted in HPI.  Chemistry and CBC: Creatinine, Ser  Date/Time Value Ref Range Status  12/25/2018 11:15 AM 11.09 (H) 0.44 - 1.00 mg/dL Final  12/14/2018 06:10 AM 9.50 (H) 0.44 - 1.00 mg/dL Final  09/17/2018 05:37 AM 8.33 (H) 0.44 - 1.00 mg/dL Final  09/16/2018 04:50 PM 8.55 (H) 0.44 - 1.00 mg/dL Final  06/23/2017 04:52 AM 6.76 (H) 0.44 - 1.00 mg/dL Final  06/22/2017 03:54 AM 6.14 (H) 0.44 - 1.00  mg/dL Final  06/21/2017 05:59 AM 5.62 (H) 0.44 - 1.00 mg/dL Final  06/20/2017 02:36 PM 5.33 (H) 0.44 - 1.00 mg/dL Final  06/19/2017 02:14 AM 4.70 (H) 0.44 - 1.00 mg/dL Final  06/18/2017 02:43 PM 4.13 (H) 0.44 - 1.00 mg/dL Final  03/16/2017 10:20 PM 3.60 (H) 0.44 - 1.00 mg/dL Final  03/16/2017 10:08 PM 3.72 (H) 0.44 - 1.00 mg/dL Final  08/30/2016 10:03 AM 3.27 (H) 0.44 - 1.00 mg/dL Final  08/29/2016 08:18 PM 2.94 (H) 0.44 - 1.00 mg/dL Final  08/29/2016 08:39 AM 2.75 (H) 0.44 - 1.00 mg/dL Final  08/06/2016 03:47 AM 2.52 (H) 0.44 - 1.00 mg/dL Final  07/26/2016 12:00 AM 2.27 (H) 0.57 - 1.00 mg/dL Final  07/05/2016 06:18 PM 2.75 (H) 0.44 - 1.00 mg/dL Final  04/30/2016 12:41 AM 2.60 (H) 0.44 - 1.00 mg/dL Final  04/29/2016 07:00 PM 2.76 (H) 0.44 - 1.00 mg/dL Final  02/04/2016 08:42 PM 2.07 (H) 0.44 - 1.00 mg/dL Final  11/20/2015 06:43 PM 2.04 (H) 0.44 - 1.00 mg/dL Final  08/25/2015 10:47 PM 2.69 (H) 0.44 - 1.00 mg/dL Final  03/17/2015 11:37 PM 2.40 (H) 0.44 - 1.00 mg/dL Final  01/01/2015 08:45 PM 1.81 (H) 0.44 - 1.00 mg/dL Final  10/22/2014 09:00 PM 2.74 (H) 0.44 - 1.00 mg/dL Final  07/26/2014 01:55 AM 2.30 (H) 0.44 - 1.00 mg/dL Final  07/26/2014 01:50 AM 2.23 (H) 0.44 - 1.00 mg/dL Final  07/25/2014 09:45 PM 2.16 (H) 0.44 - 1.00 mg/dL Final  06/20/2014 10:55 AM 1.59 (H) 0.44 - 1.00 mg/dL Final  06/12/2014 11:08 PM 1.67 (H) 0.44 - 1.00 mg/dL Final  06/06/2014 05:33 AM 1.37 (H) 0.44 - 1.00 mg/dL Final  06/05/2014 02:01 PM 1.71 (H) 0.44 - 1.00 mg/dL Final  06/04/2014 06:13 PM 1.89 (H) 0.44 - 1.00 mg/dL Final  10/29/2013 06:40 PM 0.92 0.50 - 1.10 mg/dL Final  10/26/2013 09:25 AM 0.82 0.50 - 1.10 mg/dL Final  03/23/2013 04:30 PM 0.80 0.50 - 1.10 mg/dL Final  03/21/2013 02:26 PM 0.79 0.50 - 1.10 mg/dL Final  03/14/2013 11:16 PM 0.84 0.50 - 1.10 mg/dL Final  04/03/2012 02:38 PM 0.71 0.50 - 1.10 mg/dL Final  04/01/2012 11:05 AM 0.70 0.50 - 1.10 mg/dL Final  03/16/2012 04:31 PM 0.60 0.50 - 1.10  mg/dL Final  03/16/2012 12:30 PM 0.80 0.50 - 1.10 mg/dL Final  01/07/2012 10:04 PM 0.70 0.50 - 1.10 mg/dL Final  02/04/2011 02:30 PM 0.57 0.50 - 1.10 mg/dL Final  12/21/2010 06:47 AM 0.50 0.50 - 1.10 mg/dL Final  07/01/2010 01:14 PM 0.60 0.50 - 1.10 mg/dL Final    Comment:    **Please note change in reference range.**  03/06/2010 10:15 PM 0.47 0.4 - 1.2 mg/dL Final  01/09/2010 04:50 AM 0.65 0.4 - 1.2 mg/dL Final  01/01/2010 08:55 PM 0.67 0.40 - 1.20 mg/dL Final  07/22/2009 11:19 AM 0.5 0.4 - 1.2 mg/dL Final  07/09/2009 11:39 PM 0.8 0.4 - 1.2 mg/dL Final   Recent Labs  Lab 12/25/18 1115  NA 138  K 4.2  CL 99  CO2 22  GLUCOSE 140*  BUN 55*  CREATININE 11.09*  CALCIUM 8.8*   Recent Labs  Lab 12/25/18 1115  WBC 26.4*  HGB 10.3*  HCT 33.6*  MCV 86.2  PLT 453*   Liver Function Tests: Recent Labs  Lab 12/25/18 1115  AST 14*  ALT 15  ALKPHOS 122  BILITOT 0.8  PROT 7.2  ALBUMIN 2.4*   Recent Labs  Lab 12/25/18 1115  LIPASE 15   No results for input(s): AMMONIA in the last 168 hours. Cardiac Enzymes: No results for input(s): CKTOTAL, CKMB, CKMBINDEX, TROPONINI in the last 168 hours. Iron Studies: No results for input(s): IRON, TIBC, TRANSFERRIN, FERRITIN in the last 72 hours. PT/INR: @LABRCNTIP (inr:5)  Xrays/Other Studies: ) Results for orders placed or performed during the hospital encounter of 12/25/18 (from the past 48 hour(s))  Lipase, blood     Status: None   Collection Time: 12/25/18 11:15 AM  Result Value Ref Range   Lipase 15 11 - 51 U/L    Comment: Performed at [facility], [place] [address], [place], [place] [address]  Comprehensive metabolic panel     Status: Abnormal   Collection Time: 12/25/18 11:15 AM  Result Value Ref Range   Sodium 138 135 - 145 mmol/L   Potassium 4.2 3.5 - 5.1 mmol/L   Chloride 99 98 - 111 mmol/L   CO2 22 22 - 32 mmol/L   Glucose, Bld 140 (H) 70 - 99 mg/dL   BUN 55 (H) 6 - 20 mg/dL   Creatinine, Ser 11.09 (H) 0.44  - 1.00 mg/dL   Calcium 8.8 (L) 8.9 - 10.3 mg/dL   Total Protein 7.2 6.5 - 8.1 g/dL   Albumin 2.4 (L) 3.5 - 5.0 g/dL   AST 14 (L) 15 - 41 U/L   ALT 15  0 - 44 U/L   Alkaline Phosphatase 122 38 - 126 U/L   Total Bilirubin 0.8 0.3 - 1.2 mg/dL   GFR calc non Af Amer 4 (L) >60 mL/min   GFR calc Af Amer 4 (L) >60 mL/min   Anion gap 17 (H) 5 - 15    Comment: Performed at [facility] [address], [place], [place] [address]  CBC     Status: Abnormal   Collection Time: 12/25/18 11:15 AM  Result Value Ref Range   WBC 26.4 (H) 4.0 - 10.5 K/uL   RBC 3.90 3.87 - 5.11 MIL/uL   Hemoglobin 10.3 (L) 12.0 - 15.0 g/dL   HCT 33.6 (L) 36.0 - 46.0 %   MCV 86.2 80.0 - 100.0 fL   MCH 26.4 26.0 - 34.0 pg   MCHC 30.7 30.0 - 36.0 g/dL   RDW 14.5 11.5 - 15.5 %   Platelets 453 (H) 150 - 400 K/uL   nRBC 0.0 0.0 - 0.2 %    Comment: Performed at [facility], [place] [address], [place], [place] [address]  Lactic acid, plasma     Status: None   Collection Time: 12/25/18  1:00 PM  Result Value Ref Range   Lactic Acid, Venous 1.6 0.5 - 1.9 mmol/L    Comment: Performed at [facility] [address] [place], [place] [address]   CT Abdomen Pelvis Wo Contrast  Result Date: 12/25/2018 CLINICAL DATA:  Lower back pain radiating to buttocks, hard knot in the sacral region. EXAM: CT ABDOMEN AND PELVIS WITHOUT CONTRAST TECHNIQUE: Multidetector CT imaging of the abdomen and pelvis was performed following the standard protocol without IV contrast. Patient unable to tolerate supine scanning, images obtained in a decubitus positioning. COMPARISON:  CT abdomen pelvis 03/17/2017 FINDINGS: Lower chest: Lung bases are clear. Normal heart size. No pericardial effusion. Hepatobiliary: No focal liver abnormality is seen. Patient is post cholecystectomy. Slight prominence of the biliary tree likely related to reservoir effect. No calcified intraductal gallstones. Pancreas: Unremarkable. No pancreatic ductal  dilatation or surrounding inflammatory changes. Spleen: Normal in size without focal abnormality. Adrenals/Urinary Tract: Normal adrenal glands. Increasing nonspecific bilateral perinephric stranding. No visible or concerning renal lesions. No urolithiasis or hydronephrosis. Urinary bladder is unremarkable. Stomach/Bowel: Distal esophagus, stomach and duodenal sweep are unremarkable. No small bowel wall thickening or dilatation. No evidence of obstruction. The appendix is surgically absent. No colonic dilatation or wall thickening. Minimal rectal wall thickening. Vascular/Lymphatic: Atherosclerotic plaque within the normal caliber aorta. No suspicious or enlarged lymph nodes in the included lymphatic chains. Reproductive: Uterus is surgically absent. No concerning adnexal lesions. Suspect retained ovarian tissue. Other: There is extensive soft tissue stranding and phlegmonous change in the superior gluteal cleft extending to the tip of the sacrum and towards the mesorectal fat within ill-defined possible fluid collection along the left lateral aspect of this inflammation measuring approximately 2 x 1 x 1 cm in size. Inflammatory change extends to the levator plate with some hazy intersphincteric stranding which could suggest some transsphincteric extension. No soft tissue gas is seen. No free fluid or air in the abdomen or pelvis. Peritoneal catheter terminates in the left lower quadrant. Musculoskeletal: No features of subjacent osteomyelitis of the sacrum or coccyx. No acute osseous abnormality or suspicious osseous lesion. Mild levocurvature of the spine is likely positional. IMPRESSION: 1. Extensive stranding and phlegmonous change in the superior gluteal cleft and ischioanal fossa extending to the tip of the sacrum and towards  the levator plate within ill-defined possible fluid collection along the left lateral aspect of this measuring approximately 2 x 1 x 1 cm in size possibly reflecting developing abscess.  Inflammatory change extends to the levator plate with some hazy intersphincteric stranding which could suggest some transsphincteric extension. No soft tissue gas is seen. No features of subjacent osteomyelitis of the sacrum or coccyx. 2. Increasing bilateral nonspecific perinephric stranding. Could be related to patient's decreased renal function though could consider urinalysis to exclude superimposed urinary tract infection. 3. Peritoneal catheter, likely peritoneal dialysis catheter terminating in the left lower quadrant. 4.  Aortic Atherosclerosis (ICD10-I70.0). Electronically Signed   By: Lovena Le M.D.   On: 12/25/2018 16:15    PMH:   Past Medical History:  Diagnosis Date  . Anemia   . Angio-edema   . Asthma   . CAD (coronary artery disease)    DES to mid LAD July 2018, residual moderate RCA disease  . Cataract   . CKD (chronic kidney disease) stage 4, GFR 15-29 ml/min (HCC)    Dialysis T/Th/Sa  . DVT (deep venous thrombosis) ([place])    1996 during pregnancy, 2015 left leg  . Eczema   . Essential hypertension 12/11/2015  . Gastroparesis   . GERD (gastroesophageal reflux disease)   . Hidradenitis   . Migraine   . Neuropathy   . Peripheral vascular disease ([place])    blood clot in leg  . Type 2 diabetes mellitus ([place])   . Type II diabetes mellitus ([place])   . Urticaria     PSH:   Past Surgical History:  Procedure Laterality Date  . A/V FISTULAGRAM N/A 06/22/2017   Procedure: A/V FISTULAGRAM - left arm;  Surgeon: [name], MD;  Location: [facility];  Service: Cardiovascular;  Laterality: N/A;  . ABDOMINAL AORTOGRAM W/LOWER EXTREMITY Bilateral 12/14/2018   Procedure: ABDOMINAL AORTOGRAM W/LOWER EXTREMITY;  Surgeon: [name], MD;  Location: [facility];  Service: Cardiovascular;  Laterality: Bilateral;  . ABDOMINOPLASTY    . ADENOIDECTOMY    . APPENDECTOMY  1995  . AV FISTULA PLACEMENT Left 02/01/2017   Procedure: ARTERIOVENOUS  BRACHIOCEPHALIC (AV) FISTULA CREATION;  Surgeon: [name], MD;  Location: [facility];  Service: Vascular;  Laterality: Left;  . CHOLECYSTECTOMY  1993  . CORONARY ANGIOPLASTY WITH STENT PLACEMENT    . CORONARY STENT INTERVENTION N/A 08/05/2016   Procedure: Coronary Stent Intervention;  Surgeon: [name], MD;  Location: [facility];  Service: Cardiovascular;  Laterality: N/A;  . EXPLORATORY LAPAROTOMY  08/14/2005   lysis of adhesions, drainage of tubo-ovarian abscess  . FISTULA SUPERFICIALIZATION Left 09/14/2017   Procedure: FISTULA SUPERFICIALIZATION ARTERIOVENOUS FISTULA LEFT ARM;  Surgeon: [name], MD;  Location: [facility];  Service: Vascular;  Laterality: Left;  . HYDRADENITIS EXCISION  02/08/2011   Procedure: EXCISION HYDRADENITIS GROIN;  Surgeon: [name], MD;  Location: [facility];  Service: General;  Laterality: N/A;  Excisioin of Hidradenitis Left groin  . INGUINAL HIDRADENITIS EXCISION  07/06/2010   bilateral  . INSERTION OF DIALYSIS CATHETER N/A 09/14/2017   Procedure: INSERTION OF TUNNELED DIALYSIS CATHETER Right Internal Jugular;  Surgeon: [name], MD;  Location: [facility];  Service: Vascular;  Laterality: N/A;  . LEFT HEART CATH AND CORONARY ANGIOGRAPHY N/A 08/05/2016   Procedure: Left Heart Cath and Coronary Angiography;  Surgeon: [name], MD;  Location: [facility];  Service: Cardiovascular;  Laterality: N/A;  .  PERIPHERAL VASCULAR INTERVENTION Left 12/14/2018   Procedure: PERIPHERAL VASCULAR INTERVENTION;  Surgeon: [name], MD;  Location: [facility];  Service: Cardiovascular;  Laterality: Left;  SFA  . REDUCTION MAMMAPLASTY  2002  . TONSILLECTOMY    . TUBAL LIGATION  1996  . VAGINAL HYSTERECTOMY  08/04/2005   and cysto    Allergies:  Allergies  Allergen Reactions  . Penicillins Anaphylaxis, Itching, Swelling, Rash and Other (See Comments)    Swelling of throat & whole mouth  Has  patient had a PCN reaction causing immediate rash, facial/tongue/throat swelling, SOB or lightheadedness with hypotension: Yes Has patient had a PCN reaction causing severe rash involving mucus membranes or skin necrosis: No Has patient had a PCN reaction that required hospitalization: No Has patient had a PCN reaction occurring within the last 10 years: No If all of the above answers are "NO", then may proceed with Cephalosporin use. Swelling of throat & whole mouth -angioedema Has patient had a PCN reaction causing immediate rash, facial/tongue/throat swelling, SOB or lightheadedness with hypotension: Yes Has patient had a PCN reaction causing severe rash involving mucus membranes or skin necrosis: No Has patient had a PCN reaction that required hospitalization: No Has patient had a PCN reaction occurring within the last 10 years: No If all of the above answers are "NO", then may proceed with Cephalosporin use. Swelling of th Swelling of throat & whole mouth  Has patient had a PCN reaction causing immediate rash, facial/tongue/throat swelling, SOB or lightheadedness with hypotension: Yes Has patient had a PCN reaction causing severe rash involving mucus membranes or skin necrosis: No Has patient had a PCN reaction that required hospitalization: No Has patient had a PCN reaction occurring within the last 10 years: No If all of the above answers are "NO", then may proceed with Cephalosporin use. Swelling of tongue itching and rash   . Repatha [Evolocumab] Itching  . Lisinopril Cough    Medications:   Prior to Admission medications   Medication Sig Start Date End Date Taking? Authorizing Provider  albuterol (PROVENTIL HFA;VENTOLIN HFA) 108 (90 BASE) MCG/ACT inhaler Inhale 1-2 puffs into the lungs every 6 (six) hours as needed for wheezing or shortness of breath.    Yes [provider]  amitriptyline (ELAVIL) 10 MG tablet Take 1 tablet (10 mg total) by mouth at bedtime. 09/19/18  Yes  Jamse Arn, MD  aspirin 81 MG chewable tablet Chew 1 tablet (81 mg total) by mouth daily. 08/07/16  Yes Cheryln Manly, NP  B Complex-C-Zn-Folic Acid (DIALYVITE 563 WITH ZINC) 0.8 MG TABS Take 1 tablet by mouth daily. 10/06/18  Yes [provider]  calcitRIOL (ROCALTROL) 0.5 MCG capsule Take 1.5 mcg by mouth daily. 09/11/18  Yes [provider]  calcium acetate (PHOSLO) 667 MG capsule Take 1,334-2,001 mg by mouth See admin instructions. Take three capsules (2001 mg) by mouth up to three times daily with meals and two capsules (1334 mg) with snacks 09/23/17  Yes [provider]  clopidogrel (PLAVIX) 75 MG tablet Take 1 tablet (75 mg total) by mouth daily with breakfast. 10/19/17  Yes Kilroy, Luke K, PA-C  Ferrous Sulfate (IRON) 325 (65 Fe) MG TABS Take 325 mg by mouth daily. 11/25/15  Yes Noel, Tiffany S, PA-C  Fluticasone-Salmeterol (ADVAIR) 250-50 MCG/DOSE AEPB Inhale 1 puff into the lungs 2 (two) times daily. Patient taking differently: Inhale 1 puff into the lungs daily as needed (shortness of breath).  06/10/17  Yes Newlin, Enobong,  MD  gentamicin cream (GARAMYCIN) 0.1 % Apply 1 application topically See admin instructions. Apply to access site nightly 09/11/18  Yes [provider]  HYDROcodone-acetaminophen (NORCO/VICODIN) 5-325 MG tablet Take 1 tablet by mouth every 6 (six) hours as needed for severe pain. 12/23/18  Yes Recardo Evangelist, PA-C  Insulin Glargine, 1 Unit Dial, (TOUJEO SOLOSTAR) 300 UNIT/ML SOPN Inject 25 Units into the skin 2 (two) times a day. Patient taking differently: Inject 50 Units into the skin 2 (two) times a day.  05/22/18  Yes Philemon Kingdom, MD  insulin regular (HUMULIN R) 100 units/mL injection Inject 0.2-0.25 mLs (20-25 Units total) into the skin See admin instructions. Patient taking differently: Inject 7-15 Units into the skin 3 (three) times daily before meals. Per CBG 09/22/17  Yes Philemon Kingdom, MD   lidocaine-prilocaine (EMLA) cream Apply 1 application topically See admin instructions. Apply small amount to access site (AVF) 30 minutes before hemodialysis, cover with occlusive dressing (Saran Wrap) if going out to dialysis center 09/15/17  Yes [provider]  lovastatin (MEVACOR) 20 MG tablet Take 20 mg by mouth at bedtime.    Yes [provider]  methocarbamol (ROBAXIN) 500 MG tablet Take 1 tablet (500 mg total) by mouth 2 (two) times daily as needed for muscle spasms. 09/07/17  Yes Patel, Domenick Bookbinder, MD  metoCLOPramide (REGLAN) 5 MG tablet TAKE 1 TABLET BY MOUTH 4 TIMES DAILY BEFORE MEALS AND AT BEDTIME. Patient taking differently: Take 5 mg by mouth 4 (four) times daily -  before meals and at bedtime.  03/18/17  Yes [name], MD  metoprolol succinate (TOPROL-XL) 50 MG 24 hr tablet Take 1 tablet (50 mg total) by mouth daily. 03/14/18  Yes [name], MD  montelukast (SINGULAIR) 10 MG tablet Take 1 tablet (10 mg total) by mouth at bedtime. Patient taking differently: Take 10 mg by mouth at bedtime as needed (allergies).  04/12/18  Yes Padgett, Rae Halsted, MD  mupirocin cream (BACTROBAN) 2 % Apply 1 application topically 2 (two) times daily as needed. To affected areas and cover with non-stick gauze. 11/17/18  Yes Amyot, Nicholes Stairs, NP  nitroGLYCERIN (NITROSTAT) 0.4 MG SL tablet Place 1 tablet (0.4 mg total) under the tongue every 5 (five) minutes as needed for chest pain. 10/19/17  Yes Kilroy, Luke K, PA-C  pantoprazole (PROTONIX) 40 MG tablet TAKE 1 TABLET BY MOUTH DAILY. Patient taking differently: Take 40 mg by mouth daily.  02/14/18  Yes [name], MD  pregabalin (LYRICA) 75 MG capsule Take 1 capsule (75 mg total) by mouth daily. 09/19/18  Yes Patel, Domenick Bookbinder, MD  senna-docusate (SENOKOT-S) 8.6-50 MG tablet Take 1 tablet by mouth 2 (two) times daily. Patient taking differently: Take 1 tablet by mouth daily as needed for moderate constipation.  08/30/16   Yes Vann, Jessica U, DO  traMADol (ULTRAM) 50 MG tablet Take 1 tablet (50 mg total) by mouth every 12 (twelve) hours as needed for severe pain. 11/17/18  Yes Amyot, Nicholes Stairs, NP  trimethoprim-polymyxin b (POLYTRIM) ophthalmic solution Place 1 drop into both eyes See admin instructions. Instill one drop into both eyes four times daily for 2 days after each monthly eye injection 06/16/17  Yes [provider]  isosorbide mononitrate (IMDUR) 30 MG 24 hr tablet Take 0.5 tablets (15 mg total) by mouth daily. 10/19/17 12/12/18  Erlene Quan, PA-C    Discontinued Meds:   Medications Discontinued During This Encounter  Medication Reason  . aztreonam (  AZACTAM) 1 g in sodium chloride 0.9 % 100 mL IVPB Dose change  . aztreonam (AZACTAM) 2 g in sodium chloride 0.9 % 100 mL IVPB   . vancomycin (VANCOCIN) IVPB 1000 mg/200 mL premix Dose change  . vancomycin (VANCOCIN) IVPB 1000 mg/200 mL premix   . aztreonam (AZACTAM) 0.5 g in dextrose 5 % 50 mL IVPB   . vancomycin (VANCOCIN) 1,500 mg in sodium chloride 0.9 % 500 mL IVPB Dose change  . HYDROmorphone (DILAUDID) injection 1 mg   . aztreonam (AZACTAM) 0.5 g in dextrose 5 % 50 mL IVPB   . vancomycin (VANCOCIN) 2,000 mg in sodium chloride 0.9 % 500 mL IVPB     Social History:  reports that she quit smoking about 2 years ago. Her smoking use included cigarettes. She quit after 20.00 years of use. She has never used smokeless tobacco. She reports that she does not drink alcohol or use drugs.  Family History:   Family History  Problem Relation Age of Onset  . Allergic rhinitis Mother   . Hypertension Father   . Allergic rhinitis Father   . Hypertension Sister   . Allergic rhinitis Sister   . Hypertension Brother   . Allergic rhinitis Brother   . Breast cancer Cousin        x2    Blood pressure (!) 164/73, pulse (!) 117, temperature 98.8 F (37.1 C), temperature source Oral, resp. rate 16, height 5\' 5"  (1.651 m), weight 73.5 kg, SpO2 98  %. General appearance: alert, cooperative and appears stated age Head: Normocephalic, without obvious abnormality, atraumatic Eyes: negative Neck: no adenopathy, no carotid bruit, no JVD, supple, symmetrical, trachea midline and thyroid not enlarged, symmetric, no tenderness/mass/nodules Back: symmetric, no curvature. ROM normal. No CVA tenderness. Resp: clear to auscultation bilaterally Chest wall: no tenderness Cardio: tachy GI: soft, non-tender; bowel sounds normal; no masses,  no organomegaly, buttocks very tender especially on right side, no open lesions Extremities: edema none Pulses: 2+ and symmetric Skin: Skin color, texture, turgor normal. No rashes or lesions Lymph nodes: Cervical adenopathy: none Neurologic: Grossly normal Access: PD cath + Lt BBT with great thrill       Chaz Mcglasson, Hunt Oris, MD 12/25/2018, 6:51 PM

## 2018-12-25 NOTE — ED Notes (Signed)
ED TO INPATIENT HANDOFF REPORT  ED Nurse Name and Phone #: William Hamburger, RN   S Name/Age/Gender Susan Horton 48 y.o. female Room/Bed: 024C/024C  Code Status   Code Status: Prior  Home/SNF/Other Home Patient oriented to: self, place, time and situation Is this baseline? No   Triage Complete: Triage complete  Chief Complaint Back Pains  Triage Note Pt reports right buttocks pain, nausea, vomiting, abdominal pain and can't poop. Pt appears distressed due to pain. Seen here for same 2 days ago. VSS.     Allergies Allergies  Allergen Reactions  . Penicillins Anaphylaxis, Itching, Swelling, Rash and Other (See Comments)    Swelling of throat & whole mouth  Has patient had a PCN reaction causing immediate rash, facial/tongue/throat swelling, SOB or lightheadedness with hypotension: Yes Has patient had a PCN reaction causing severe rash involving mucus membranes or skin necrosis: No Has patient had a PCN reaction that required hospitalization: No Has patient had a PCN reaction occurring within the last 10 years: No If all of the above answers are "NO", then may proceed with Cephalosporin use. Swelling of throat & whole mouth -angioedema Has patient had a PCN reaction causing immediate rash, facial/tongue/throat swelling, SOB or lightheadedness with hypotension: Yes Has patient had a PCN reaction causing severe rash involving mucus membranes or skin necrosis: No Has patient had a PCN reaction that required hospitalization: No Has patient had a PCN reaction occurring within the last 10 years: No If all of the above answers are "NO", then may proceed with Cephalosporin use. Swelling of th Swelling of throat & whole mouth  Has patient had a PCN reaction causing immediate rash, facial/tongue/throat swelling, SOB or lightheadedness with hypotension: Yes Has patient had a PCN reaction causing severe rash involving mucus membranes or skin necrosis: No Has patient had a PCN reaction that  required hospitalization: No Has patient had a PCN reaction occurring within the last 10 years: No If all of the above answers are "NO", then may proceed with Cephalosporin use. Swelling of tongue itching and rash   . Repatha [Evolocumab] Itching  . Lisinopril Cough    Level of Care/Admitting Diagnosis ED Disposition    ED Disposition Condition Comment   Admit  The patient appears reasonably stabilized for admission considering the current resources, flow, and capabilities available in the ED at this time, and I doubt any other [facility] requiring further screening and/or treatment in the ED prior to admission is  present.       B Medical/Surgery History Past Medical History:  Diagnosis Date  . Anemia   . Angio-edema   . Asthma   . CAD (coronary artery disease)    DES to mid LAD July 2018, residual moderate RCA disease  . Cataract   . CKD (chronic kidney disease) stage 4, GFR 15-29 ml/min (HCC)    Dialysis T/Th/Sa  . DVT (deep venous thrombosis) ([place])    1996 during pregnancy, 2015 left leg  . Eczema   . Essential hypertension 12/11/2015  . Gastroparesis   . GERD (gastroesophageal reflux disease)   . Hidradenitis   . Migraine   . Neuropathy   . Peripheral vascular disease ([place])    blood clot in leg  . Type 2 diabetes mellitus ([place])   . Type II diabetes mellitus ([place])   . Urticaria    Past Surgical History:  Procedure Laterality Date  . A/V FISTULAGRAM N/A 06/22/2017   Procedure: A/V FISTULAGRAM - left arm;  Surgeon: [name],  MD;  Location: [facility];  Service: Cardiovascular;  Laterality: N/A;  . ABDOMINAL AORTOGRAM W/LOWER EXTREMITY Bilateral 12/14/2018   Procedure: ABDOMINAL AORTOGRAM W/LOWER EXTREMITY;  Surgeon: [name], MD;  Location: [facility];  Service: Cardiovascular;  Laterality: Bilateral;  . ABDOMINOPLASTY    . ADENOIDECTOMY    . APPENDECTOMY  1995  . AV FISTULA PLACEMENT Left 02/01/2017   Procedure: ARTERIOVENOUS  BRACHIOCEPHALIC (AV) FISTULA CREATION;  Surgeon: [name], MD;  Location: [place];  Service: Vascular;  Laterality: Left;  . CHOLECYSTECTOMY  1993  . CORONARY ANGIOPLASTY WITH STENT PLACEMENT    . CORONARY STENT INTERVENTION N/A 08/05/2016   Procedure: Coronary Stent Intervention;  Surgeon: [name], MD;  Location: [place];  Service: Cardiovascular;  Laterality: N/A;  . EXPLORATORY LAPAROTOMY  08/14/2005   lysis of adhesions, drainage of tubo-ovarian abscess  . FISTULA SUPERFICIALIZATION Left 09/14/2017   Procedure: FISTULA SUPERFICIALIZATION ARTERIOVENOUS FISTULA LEFT ARM;  Surgeon: [name], MD;  Location: [facility];  Service: Vascular;  Laterality: Left;  . HYDRADENITIS EXCISION  02/08/2011   Procedure: EXCISION HYDRADENITIS GROIN;  Surgeon: [name], MD;  Location: [facility];  Service: General;  Laterality: N/A;  Excisioin of Hidradenitis Left groin  . INGUINAL HIDRADENITIS EXCISION  07/06/2010   bilateral  . INSERTION OF DIALYSIS CATHETER N/A 09/14/2017   Procedure: INSERTION OF TUNNELED DIALYSIS CATHETER Right Internal Jugular;  Surgeon: [name], MD;  Location: [facility];  Service: Vascular;  Laterality: N/A;  . LEFT HEART CATH AND CORONARY ANGIOGRAPHY N/A 08/05/2016   Procedure: Left Heart Cath and Coronary Angiography;  Surgeon: [name], MD;  Location: [facility];  Service: Cardiovascular;  Laterality: N/A;  . PERIPHERAL VASCULAR INTERVENTION Left 12/14/2018   Procedure: PERIPHERAL VASCULAR INTERVENTION;  Surgeon: [name], MD;  Location: [facility];  Service: Cardiovascular;  Laterality: Left;  SFA  . REDUCTION MAMMAPLASTY  2002  . TONSILLECTOMY    . TUBAL LIGATION  1996  . VAGINAL HYSTERECTOMY  08/04/2005   and cysto     A IV Location/Drains/Wounds Patient Lines/Drains/Airways Status   Active Line/Drains/Airways    Name:   Placement date:   Placement time:   Site:   Days:    Peripheral IV 12/25/18 Right Antecubital   12/25/18    1255    Antecubital   less than 1   Fistula / Graft Left Upper arm Arteriovenous fistula   02/01/17    2342    Upper arm   692   Incision (Closed) 09/16/18 Abdomen   09/16/18    2200     100          Intake/Output Last 24 hours No intake or output data in the 24 hours ending 12/25/18 1739  Labs/Imaging Results for orders placed or performed during the hospital encounter of 12/25/18 (from the past 48 hour(s))  Lipase, blood     Status: None   Collection Time: 12/25/18 11:15 AM  Result Value Ref Range   Lipase 15 11 - 51 U/L    Comment: Performed at [facility], [place] [address], [place], [place] [address]  Comprehensive metabolic panel     Status: Abnormal   Collection Time: 12/25/18 11:15 AM  Result Value Ref Range   Sodium 138 135 - 145 mmol/L   Potassium 4.2 3.5 - 5.1 mmol/L   Chloride 99 98 - 111 mmol/L   CO2  22 22 - 32 mmol/L   Glucose, Bld 140 (H) 70 - 99 mg/dL   BUN 55 (H) 6 - 20 mg/dL   Creatinine, Ser 11.09 (H) 0.44 - 1.00 mg/dL   Calcium 8.8 (L) 8.9 - 10.3 mg/dL   Total Protein 7.2 6.5 - 8.1 g/dL   Albumin 2.4 (L) 3.5 - 5.0 g/dL   AST 14 (L) 15 - 41 U/L   ALT 15 0 - 44 U/L   Alkaline Phosphatase 122 38 - 126 U/L   Total Bilirubin 0.8 0.3 - 1.2 mg/dL   GFR calc non Af Amer 4 (L) >60 mL/min   GFR calc Af Amer 4 (L) >60 mL/min   Anion gap 17 (H) 5 - 15    Comment: Performed at [facility] [address] [place], [place] [address]  CBC     Status: Abnormal   Collection Time: 12/25/18 11:15 AM  Result Value Ref Range   WBC 26.4 (H) 4.0 - 10.5 K/uL   RBC 3.90 3.87 - 5.11 MIL/uL   Hemoglobin 10.3 (L) 12.0 - 15.0 g/dL   HCT 33.6 (L) 36.0 - 46.0 %   MCV 86.2 80.0 - 100.0 fL   MCH 26.4 26.0 - 34.0 pg   MCHC 30.7 30.0 - 36.0 g/dL   RDW 14.5 11.5 - 15.5 %   Platelets 453 (H) 150 - 400 K/uL   nRBC 0.0 0.0 - 0.2 %    Comment: Performed at [facility], [place] [address] [place], [place] [address]   Lactic acid, plasma     Status: None   Collection Time: 12/25/18  1:00 PM  Result Value Ref Range   Lactic Acid, Venous 1.6 0.5 - 1.9 mmol/L    Comment: Performed at [facility] [address] [place], [place] [address]   CT Abdomen Pelvis Wo Contrast  Result Date: 12/25/2018 CLINICAL DATA:  Lower back pain radiating to buttocks, hard knot in the sacral region. EXAM: CT ABDOMEN AND PELVIS WITHOUT CONTRAST TECHNIQUE: Multidetector CT imaging of the abdomen and pelvis was performed following the standard protocol without IV contrast. Patient unable to tolerate supine scanning, images obtained in a decubitus positioning. COMPARISON:  CT abdomen pelvis 03/17/2017 FINDINGS: Lower chest: Lung bases are clear. Normal heart size. No pericardial effusion. Hepatobiliary: No focal liver abnormality is seen. Patient is post cholecystectomy. Slight prominence of the biliary tree likely related to reservoir effect. No calcified intraductal gallstones. Pancreas: Unremarkable. No pancreatic ductal dilatation or surrounding inflammatory changes. Spleen: Normal in size without focal abnormality. Adrenals/Urinary Tract: Normal adrenal glands. Increasing nonspecific bilateral perinephric stranding. No visible or concerning renal lesions. No urolithiasis or hydronephrosis. Urinary bladder is unremarkable. Stomach/Bowel: Distal esophagus, stomach and duodenal sweep are unremarkable. No small bowel wall thickening or dilatation. No evidence of obstruction. The appendix is surgically absent. No colonic dilatation or wall thickening. Minimal rectal wall thickening. Vascular/Lymphatic: Atherosclerotic plaque within the normal caliber aorta. No suspicious or enlarged lymph nodes in the included lymphatic chains. Reproductive: Uterus is surgically absent. No concerning adnexal lesions. Suspect retained ovarian tissue. Other: There is extensive soft tissue stranding and phlegmonous change in the superior gluteal cleft  extending to the tip of the sacrum and towards the mesorectal fat within ill-defined possible fluid collection along the left lateral aspect of this inflammation measuring approximately 2 x 1 x 1 cm in size. Inflammatory change extends to the levator plate with some hazy intersphincteric stranding which could suggest some transsphincteric extension.  No soft tissue gas is seen. No free fluid or air in the abdomen or pelvis. Peritoneal catheter terminates in the left lower quadrant. Musculoskeletal: No features of subjacent osteomyelitis of the sacrum or coccyx. No acute osseous abnormality or suspicious osseous lesion. Mild levocurvature of the spine is likely positional. IMPRESSION: 1. Extensive stranding and phlegmonous change in the superior gluteal cleft and ischioanal fossa extending to the tip of the sacrum and towards the levator plate within ill-defined possible fluid collection along the left lateral aspect of this measuring approximately 2 x 1 x 1 cm in size possibly reflecting developing abscess. Inflammatory change extends to the levator plate with some hazy intersphincteric stranding which could suggest some transsphincteric extension. No soft tissue gas is seen. No features of subjacent osteomyelitis of the sacrum or coccyx. 2. Increasing bilateral nonspecific perinephric stranding. Could be related to patient's decreased renal function though could consider urinalysis to exclude superimposed urinary tract infection. 3. Peritoneal catheter, likely peritoneal dialysis catheter terminating in the left lower quadrant. 4.  Aortic Atherosclerosis (ICD10-I70.0). Electronically Signed   By: Lovena Le M.D.   On: 12/25/2018 16:15    Pending Labs Unresulted Labs (From admission, onward)    Start     Ordered   12/25/18 1711  SARS CORONAVIRUS 2 (TAT 6-24 HRS) Nasopharyngeal Nasopharyngeal Swab  (Tier 3 (TAT 6-24 hrs))  Once,   STAT    Question Answer Comment  Is this test for diagnosis or screening  Screening   Symptomatic for COVID-19 as defined by CDC No   Hospitalized for COVID-19 No   Admitted to ICU for COVID-19 No   Previously tested for COVID-19 No   Resident in a congregate (group) care setting No   Employed in healthcare setting No   Pregnant No      12/25/18 1711   12/25/18 1227  Lactic acid, plasma  Now then every 2 hours,   STAT     12/25/18 1228   12/25/18 1227  Culture, blood (routine x 2)  BLOOD CULTURE X 2,   STAT     12/25/18 1228   12/25/18 1109  Urinalysis, Routine w reflex microscopic  ONCE - STAT,   STAT     12/25/18 1108          Vitals/Pain Today's Vitals   12/25/18 1330 12/25/18 1558 12/25/18 1600 12/25/18 1717  BP: (!) 168/58  (!) 151/127 (!) 164/73  Pulse: (!) 117  71 (!) 117  Resp: (!) 24  (!) 24 16  Temp:      TempSrc:      SpO2: 100%  97% 98%  Weight:      Height:      PainSc:  10-Worst pain ever  9     Isolation Precautions No active isolations  Medications Medications  HYDROmorphone (DILAUDID) injection 1 mg (has no administration in time range)  vancomycin variable dose per unstable renal function (pharmacist dosing) (has no administration in time range)  aztreonam (AZACTAM) 0.5 g in dextrose 5 % 50 mL IVPB (0.5 g Intravenous New Bag/Given 12/25/18 1720)  vancomycin (VANCOCIN) 2,000 mg in sodium chloride 0.9 % 500 mL IVPB (has no administration in time range)  sodium chloride flush (NS) 0.9 % injection 3 mL (3 mLs Intravenous Given 12/25/18 1311)  HYDROmorphone (DILAUDID) injection 1 mg (1 mg Intravenous Given 12/25/18 1311)  HYDROmorphone (DILAUDID) injection 1 mg (1 mg Intravenous Given 12/25/18 1559)    Mobility walks     Focused Assessments Abdominal  R Recommendations: See Admitting Provider Note  Report given to:   Additional Notes:

## 2018-12-25 NOTE — Progress Notes (Addendum)
Pharmacy Antibiotic Note  Susan Horton is a 48 y.o. female admitted on 12/25/2018 with cellulitis.  Pharmacy has been consulted for aztreonam/vancomycin dosing. "Anaphylaxis, itching, swelling, rash" allergy reported to PCN, no beta lactam use history found in chart. Noted patient is ESRD on PD, reports only completing half of her treatment last night due to pain.  Plan: Aztreonam 500mg  IV q8h (PD dosing - 25% of usual dosage at the usual interval) Vancomcyin 2g IV x 1; then obtain vancomycin level in 3-4 days and re-dose once level <20. This interval will determine the dosing frequency Monitor clinical progress, c/s, abx plan/LOT F/u Nephrology consult  ADDENDUM - Pharmacy consulted to add Flagyl. No dosage adjustment in PD.  Plan: Start Flagyl 500mg  IV q8h   Height: 5\' 5"  (165.1 cm) Weight: 162 lb (73.5 kg) IBW/kg (Calculated) : 57  Temp (24hrs), Avg:98.8 F (37.1 C), Min:98.8 F (37.1 C), Max:98.8 F (37.1 C)  Recent Labs  Lab 12/25/18 1115 12/25/18 1300  WBC 26.4*  --   CREATININE 11.09*  --   LATICACIDVEN  --  1.6    Estimated Creatinine Clearance: 6.2 mL/min (A) (by C-G formula based on SCr of 11.09 mg/dL (H)).    Allergies  Allergen Reactions  . Penicillins Anaphylaxis, Itching, Swelling, Rash and Other (See Comments)    Swelling of throat & whole mouth  Has patient had a PCN reaction causing immediate rash, facial/tongue/throat swelling, SOB or lightheadedness with hypotension: Yes Has patient had a PCN reaction causing severe rash involving mucus membranes or skin necrosis: No Has patient had a PCN reaction that required hospitalization: No Has patient had a PCN reaction occurring within the last 10 years: No If all of the above answers are "NO", then may proceed with Cephalosporin use. Swelling of throat & whole mouth -angioedema Has patient had a PCN reaction causing immediate rash, facial/tongue/throat swelling, SOB or lightheadedness with hypotension:  Yes Has patient had a PCN reaction causing severe rash involving mucus membranes or skin necrosis: No Has patient had a PCN reaction that required hospitalization: No Has patient had a PCN reaction occurring within the last 10 years: No If all of the above answers are "NO", then may proceed with Cephalosporin use. Swelling of th Swelling of throat & whole mouth  Has patient had a PCN reaction causing immediate rash, facial/tongue/throat swelling, SOB or lightheadedness with hypotension: Yes Has patient had a PCN reaction causing severe rash involving mucus membranes or skin necrosis: No Has patient had a PCN reaction that required hospitalization: No Has patient had a PCN reaction occurring within the last 10 years: No If all of the above answers are "NO", then may proceed with Cephalosporin use. Swelling of tongue itching and rash   . Repatha [Evolocumab] Itching  . Lisinopril Cough    Elicia Lamp, PharmD, BCPS Please check AMION for all [facility] contact numbers Clinical Pharmacist 12/25/2018 5:00 PM

## 2018-12-25 NOTE — ED Notes (Signed)
Pt transported to CT ?

## 2018-12-26 DIAGNOSIS — Z20828 Contact with and (suspected) exposure to other viral communicable diseases: Secondary | ICD-10-CM | POA: Diagnosis present

## 2018-12-26 DIAGNOSIS — I5033 Acute on chronic diastolic (congestive) heart failure: Secondary | ICD-10-CM | POA: Diagnosis present

## 2018-12-26 DIAGNOSIS — K651 Peritoneal abscess: Secondary | ICD-10-CM | POA: Diagnosis not present

## 2018-12-26 DIAGNOSIS — D72829 Elevated white blood cell count, unspecified: Secondary | ICD-10-CM | POA: Diagnosis not present

## 2018-12-26 DIAGNOSIS — R933 Abnormal findings on diagnostic imaging of other parts of digestive tract: Secondary | ICD-10-CM | POA: Diagnosis not present

## 2018-12-26 DIAGNOSIS — D631 Anemia in chronic kidney disease: Secondary | ICD-10-CM | POA: Diagnosis present

## 2018-12-26 DIAGNOSIS — Z992 Dependence on renal dialysis: Secondary | ICD-10-CM | POA: Diagnosis not present

## 2018-12-26 DIAGNOSIS — S300XXA Contusion of lower back and pelvis, initial encounter: Secondary | ICD-10-CM | POA: Diagnosis present

## 2018-12-26 DIAGNOSIS — N186 End stage renal disease: Secondary | ICD-10-CM | POA: Diagnosis present

## 2018-12-26 DIAGNOSIS — M4628 Osteomyelitis of vertebra, sacral and sacrococcygeal region: Secondary | ICD-10-CM | POA: Diagnosis present

## 2018-12-26 DIAGNOSIS — K3184 Gastroparesis: Secondary | ICD-10-CM | POA: Diagnosis present

## 2018-12-26 DIAGNOSIS — R52 Pain, unspecified: Secondary | ICD-10-CM | POA: Diagnosis not present

## 2018-12-26 DIAGNOSIS — N2581 Secondary hyperparathyroidism of renal origin: Secondary | ICD-10-CM | POA: Diagnosis present

## 2018-12-26 DIAGNOSIS — W010XXA Fall on same level from slipping, tripping and stumbling without subsequent striking against object, initial encounter: Secondary | ICD-10-CM | POA: Diagnosis not present

## 2018-12-26 DIAGNOSIS — E1142 Type 2 diabetes mellitus with diabetic polyneuropathy: Secondary | ICD-10-CM | POA: Diagnosis present

## 2018-12-26 DIAGNOSIS — I132 Hypertensive heart and chronic kidney disease with heart failure and with stage 5 chronic kidney disease, or end stage renal disease: Secondary | ICD-10-CM | POA: Diagnosis present

## 2018-12-26 DIAGNOSIS — E1143 Type 2 diabetes mellitus with diabetic autonomic (poly)neuropathy: Secondary | ICD-10-CM | POA: Diagnosis present

## 2018-12-26 DIAGNOSIS — L0231 Cutaneous abscess of buttock: Secondary | ICD-10-CM | POA: Diagnosis present

## 2018-12-26 DIAGNOSIS — E876 Hypokalemia: Secondary | ICD-10-CM | POA: Diagnosis present

## 2018-12-26 DIAGNOSIS — K5900 Constipation, unspecified: Secondary | ICD-10-CM | POA: Diagnosis present

## 2018-12-26 DIAGNOSIS — D62 Acute posthemorrhagic anemia: Secondary | ICD-10-CM | POA: Diagnosis present

## 2018-12-26 DIAGNOSIS — K644 Residual hemorrhoidal skin tags: Secondary | ICD-10-CM | POA: Diagnosis present

## 2018-12-26 DIAGNOSIS — Z888 Allergy status to other drugs, medicaments and biological substances status: Secondary | ICD-10-CM | POA: Diagnosis not present

## 2018-12-26 DIAGNOSIS — I120 Hypertensive chronic kidney disease with stage 5 chronic kidney disease or end stage renal disease: Secondary | ICD-10-CM | POA: Diagnosis not present

## 2018-12-26 DIAGNOSIS — Y929 Unspecified place or not applicable: Secondary | ICD-10-CM | POA: Diagnosis not present

## 2018-12-26 DIAGNOSIS — K659 Peritonitis, unspecified: Secondary | ICD-10-CM | POA: Diagnosis present

## 2018-12-26 DIAGNOSIS — E1165 Type 2 diabetes mellitus with hyperglycemia: Secondary | ICD-10-CM | POA: Diagnosis present

## 2018-12-26 DIAGNOSIS — I5032 Chronic diastolic (congestive) heart failure: Secondary | ICD-10-CM | POA: Diagnosis present

## 2018-12-26 DIAGNOSIS — E1140 Type 2 diabetes mellitus with diabetic neuropathy, unspecified: Secondary | ICD-10-CM | POA: Diagnosis present

## 2018-12-26 DIAGNOSIS — E1151 Type 2 diabetes mellitus with diabetic peripheral angiopathy without gangrene: Secondary | ICD-10-CM | POA: Diagnosis present

## 2018-12-26 DIAGNOSIS — D649 Anemia, unspecified: Secondary | ICD-10-CM | POA: Diagnosis not present

## 2018-12-26 DIAGNOSIS — K648 Other hemorrhoids: Secondary | ICD-10-CM | POA: Diagnosis present

## 2018-12-26 DIAGNOSIS — M8618 Other acute osteomyelitis, other site: Secondary | ICD-10-CM | POA: Diagnosis not present

## 2018-12-26 DIAGNOSIS — I2510 Atherosclerotic heart disease of native coronary artery without angina pectoris: Secondary | ICD-10-CM | POA: Diagnosis present

## 2018-12-26 DIAGNOSIS — E1122 Type 2 diabetes mellitus with diabetic chronic kidney disease: Secondary | ICD-10-CM | POA: Diagnosis present

## 2018-12-26 LAB — RENAL FUNCTION PANEL
Albumin: 2 g/dL — ABNORMAL LOW (ref 3.5–5.0)
Anion gap: 15 (ref 5–15)
BUN: 59 mg/dL — ABNORMAL HIGH (ref 6–20)
CO2: 18 mmol/L — ABNORMAL LOW (ref 22–32)
Calcium: 8.5 mg/dL — ABNORMAL LOW (ref 8.9–10.3)
Chloride: 101 mmol/L (ref 98–111)
Creatinine, Ser: 11.09 mg/dL — ABNORMAL HIGH (ref 0.44–1.00)
GFR calc Af Amer: 4 mL/min — ABNORMAL LOW (ref >60)
GFR calc non Af Amer: 4 mL/min — ABNORMAL LOW (ref >60)
Glucose, Bld: 217 mg/dL — ABNORMAL HIGH (ref 70–99)
Phosphorus: 5.7 mg/dL — ABNORMAL HIGH (ref 2.5–4.6)
Potassium: 4.5 mmol/L (ref 3.5–5.1)
Sodium: 134 mmol/L — ABNORMAL LOW (ref 135–145)

## 2018-12-26 LAB — CBC WITH DIFFERENTIAL/PLATELET
Abs Immature Granulocytes: 0 10*3/uL (ref 0.00–0.07)
Basophils Absolute: 0 10*3/uL (ref 0.0–0.1)
Basophils Relative: 0 %
Eosinophils Absolute: 0 10*3/uL (ref 0.0–0.5)
Eosinophils Relative: 0 %
HCT: 28.8 % — ABNORMAL LOW (ref 36.0–46.0)
Hemoglobin: 8.9 g/dL — ABNORMAL LOW (ref 12.0–15.0)
Lymphocytes Relative: 4 %
Lymphs Abs: 1.3 10*3/uL (ref 0.7–4.0)
MCH: 26.3 pg (ref 26.0–34.0)
MCHC: 30.9 g/dL (ref 30.0–36.0)
MCV: 85 fL (ref 80.0–100.0)
Monocytes Absolute: 1.7 10*3/uL — ABNORMAL HIGH (ref 0.1–1.0)
Monocytes Relative: 5 %
Neutro Abs: 30.2 10*3/uL — ABNORMAL HIGH (ref 1.7–7.7)
Neutrophils Relative %: 91 %
Platelets: 425 10*3/uL — ABNORMAL HIGH (ref 150–400)
RBC: 3.39 MIL/uL — ABNORMAL LOW (ref 3.87–5.11)
RDW: 14.5 % (ref 11.5–15.5)
WBC: 33.2 10*3/uL — ABNORMAL HIGH (ref 4.0–10.5)
nRBC: 0 % (ref 0.0–0.2)
nRBC: 0 /100 WBC

## 2018-12-26 LAB — GLUCOSE, CAPILLARY
Glucose-Capillary: 141 mg/dL — ABNORMAL HIGH (ref 70–99)
Glucose-Capillary: 195 mg/dL — ABNORMAL HIGH (ref 70–99)
Glucose-Capillary: 188 mg/dL — ABNORMAL HIGH (ref 70–99)
Glucose-Capillary: 205 mg/dL — ABNORMAL HIGH (ref 70–99)

## 2018-12-26 LAB — HEMOGLOBIN A1C
Hgb A1c MFr Bld: 7.5 % — ABNORMAL HIGH (ref 4.8–5.6)
Mean Plasma Glucose: 168.55 mg/dL

## 2018-12-26 MED ORDER — HEPARIN SODIUM (PORCINE) 5000 UNIT/ML IJ SOLN
5000.0000 [IU] | Freq: Three times a day (TID) | INTRAMUSCULAR | Status: DC
  Administered 2018-12-26 (×2): 5000 [IU] via SUBCUTANEOUS
  Filled 2018-12-26 (×2): qty 1

## 2018-12-26 NOTE — Progress Notes (Addendum)
Pt was seen in room, alert/oriented in  No apparent distress. Informed  Pt of PD orders. Pt refused to be dialyzed., Pt stated " Im a lot of pain right now,and do this in the morning. Primary nurse aware and on call nephrologist made aware of above.

## 2018-12-26 NOTE — Plan of Care (Signed)
  Problem: Activity: Goal: Risk for activity intolerance will decrease Outcome: Progressing   Problem: Nutrition: Goal: Adequate nutrition will be maintained Outcome: Progressing   Problem: Coping: Goal: Level of anxiety will decrease Outcome: Progressing   

## 2018-12-26 NOTE — Progress Notes (Signed)
[facility] Progress Note   Dialysis Orders: CCPD - 1 2.5 5 L bag and 1 1.5 5 L bag - 5 fills - 2 L fills with 1 L in last fill dwell 1.5 hr   Assessment/Plan: 1. Gluteal abscess - WBC 25 > 33 K, seen by surgery - trying to avoid surgery due to DM/empiric antibiotics per primary 2.  Gastroparesis - chronic problem -on chronic Reglan 5 mg ; states phenergan and Zofran not effective  3. ESRD - PD -  K 4.5 CO2 18 - needs dialysis - resume this evening  4. Anemia - hgb 10.3 - hold oral Fe to lessen pill burden with gastroparesis - resume at d/c - follow if drops will need ESA 5. Secondary hyperparathyroidism - Ca 8.5 obn 3 Ca acetate ac - hold for now to lessen pill burden- resume at d/c - continue calcitriol  6. HTN/volume - BP up today - meds resumed but can't keep down. Given a liter of fluid - no excess on exam- use all 1.5% tonight -doubt she will be able to eat or drink much today 7. Nutrition - alb 2.4 - can't eat at present due to N and V 8 . DM- per primary  Myriam Jacobson, PA-C [facility] Beeper [phone] 12/26/2018,1:01 PM  LOS: 0 days   Subjective:   Did not have PD last night due to vomiting - said they were going to do tonight. Vomiting meds. Food making her nauseated.  Objective Vitals:   12/25/18 2100 12/26/18 0356 12/26/18 1000 12/26/18 1030  BP: 115/60 139/65 (!) 146/64 (!) 148/62  Pulse: 95 (!) 111 (!) 108 (!) 106  Resp:  18 15 15   Temp:  97.6 F (36.4 C) 99.1 F (37.3 C) 99 F (37.2 C)  TempSrc:  Oral Oral Oral  SpO2:  100%    Weight:      Height:       Physical Exam General: breathing easily, very uncomfortable from pain Heart: tachy reg Lungs: no rales Abdomen: soft NT PD cath intact Buttocks: swelling and tenderness with displacement of gluteal clef - hurts to move Extremities: no edema Dialysis Access: left upper AVF + bruit and PD cath   Additional Objective Labs: Basic Metabolic Panel: Recent Labs  Lab  12/25/18 1115 12/26/18 1047  NA 138 134*  K 4.2 4.5  CL 99 101  CO2 22 18*  GLUCOSE 140* 217*  BUN 55* 59*  CREATININE 11.09* 11.09*  CALCIUM 8.8* 8.5*  PHOS  --  5.7*   Liver Function Tests: Recent Labs  Lab 12/25/18 1115 12/26/18 1047  AST 14*  --   ALT 15  --   ALKPHOS 122  --   BILITOT 0.8  --   PROT 7.2  --   ALBUMIN 2.4* 2.0*   Recent Labs  Lab 12/25/18 1115  LIPASE 15   CBC: Recent Labs  Lab 12/25/18 1115 12/26/18 1047  WBC 26.4* 33.2*  NEUTROABS  --  30.2*  HGB 10.3* 8.9*  HCT 33.6* 28.8*  MCV 86.2 85.0  PLT 453* 425*   Blood Culture    Component Value Date/Time   SDES CSF 04/01/2012 0403   SPECREQUEST NONE 04/01/2012 0403   CULT NO GROWTH 3 DAYS 04/01/2012 0403   REPTSTATUS 04/04/2012 FINAL 04/01/2012 0403    Cardiac Enzymes: No results for input(s): CKTOTAL, CKMB, CKMBINDEX, TROPONINI in the last 168 hours. CBG: Recent Labs  Lab 12/25/18 2027 12/25/18 2342 12/26/18 0754 12/26/18 1152  GLUCAP  110* 127* 141* 195*   Iron Studies: No results for input(s): IRON, TIBC, TRANSFERRIN, FERRITIN in the last 72 hours. Lab Results  Component Value Date   INR 0.91 02/01/2017   INR 0.9 07/26/2016   INR 1.07 10/23/2014   Studies/Results: CT Abdomen Pelvis Wo Contrast  Result Date: 12/25/2018 CLINICAL DATA:  Lower back pain radiating to buttocks, hard knot in the sacral region. EXAM: CT ABDOMEN AND PELVIS WITHOUT CONTRAST TECHNIQUE: Multidetector CT imaging of the abdomen and pelvis was performed following the standard protocol without IV contrast. Patient unable to tolerate supine scanning, images obtained in a decubitus positioning. COMPARISON:  CT abdomen pelvis 03/17/2017 FINDINGS: Lower chest: Lung bases are clear. Normal heart size. No pericardial effusion. Hepatobiliary: No focal liver abnormality is seen. Patient is post cholecystectomy. Slight prominence of the biliary tree likely related to reservoir effect. No calcified intraductal  gallstones. Pancreas: Unremarkable. No pancreatic ductal dilatation or surrounding inflammatory changes. Spleen: Normal in size without focal abnormality. Adrenals/Urinary Tract: Normal adrenal glands. Increasing nonspecific bilateral perinephric stranding. No visible or concerning renal lesions. No urolithiasis or hydronephrosis. Urinary bladder is unremarkable. Stomach/Bowel: Distal esophagus, stomach and duodenal sweep are unremarkable. No small bowel wall thickening or dilatation. No evidence of obstruction. The appendix is surgically absent. No colonic dilatation or wall thickening. Minimal rectal wall thickening. Vascular/Lymphatic: Atherosclerotic plaque within the normal caliber aorta. No suspicious or enlarged lymph nodes in the included lymphatic chains. Reproductive: Uterus is surgically absent. No concerning adnexal lesions. Suspect retained ovarian tissue. Other: There is extensive soft tissue stranding and phlegmonous change in the superior gluteal cleft extending to the tip of the sacrum and towards the mesorectal fat within ill-defined possible fluid collection along the left lateral aspect of this inflammation measuring approximately 2 x 1 x 1 cm in size. Inflammatory change extends to the levator plate with some hazy intersphincteric stranding which could suggest some transsphincteric extension. No soft tissue gas is seen. No free fluid or air in the abdomen or pelvis. Peritoneal catheter terminates in the left lower quadrant. Musculoskeletal: No features of subjacent osteomyelitis of the sacrum or coccyx. No acute osseous abnormality or suspicious osseous lesion. Mild levocurvature of the spine is likely positional. IMPRESSION: 1. Extensive stranding and phlegmonous change in the superior gluteal cleft and ischioanal fossa extending to the tip of the sacrum and towards the levator plate within ill-defined possible fluid collection along the left lateral aspect of this measuring approximately 2 x 1  x 1 cm in size possibly reflecting developing abscess. Inflammatory change extends to the levator plate with some hazy intersphincteric stranding which could suggest some transsphincteric extension. No soft tissue gas is seen. No features of subjacent osteomyelitis of the sacrum or coccyx. 2. Increasing bilateral nonspecific perinephric stranding. Could be related to patient's decreased renal function though could consider urinalysis to exclude superimposed urinary tract infection. 3. Peritoneal catheter, likely peritoneal dialysis catheter terminating in the left lower quadrant. 4.  Aortic Atherosclerosis (ICD10-I70.0). Electronically Signed   By: Lovena Le M.D.   On: 12/25/2018 16:15   Medications: . aztreonam 0.5 g (12/26/18 0937)  . dialysis solution 1.5% low-MG/low-CA    . metronidazole 500 mg (12/26/18 1031)   . acetaminophen  650 mg Oral Q6H  . amitriptyline  10 mg Oral QHS  . calcitRIOL  1.5 mcg Oral Daily  . calcium acetate  2,001 mg Oral TID WC  . ferrous sulfate  325 mg Oral Daily  . gentamicin cream  1 application Topical Daily  .  heparin  5,000 Units Subcutaneous Q8H  . insulin aspart  0-6 Units Subcutaneous TID WC  . metoCLOPramide  5 mg Oral TID AC & HS  . metoprolol succinate  50 mg Oral Daily  . mometasone-formoterol  2 puff Inhalation BID  . oxyCODONE  5 mg Oral Q6H  . pantoprazole  40 mg Oral Daily  . pravastatin  20 mg Oral q1800  . pregabalin  75 mg Oral Daily  . vancomycin variable dose per unstable renal function (pharmacist dosing)   Does not apply See admin instructions

## 2018-12-26 NOTE — Progress Notes (Addendum)
Subjective: CC: Reports pain along left buttock is the same as yesterday. No better or worse. Denies f/c/n/v.   Objective: Vital signs in last 24 hours: Temp:  [97.6 F (36.4 C)-98.8 F (37.1 C)] 97.6 F (36.4 C) (12/15 0356) Pulse Rate:  [71-127] 111 (12/15 0356) Resp:  [16-24] 18 (12/15 0356) BP: (115-168)/(58-127) 139/65 (12/15 0356) SpO2:  [97 %-100 %] 100 % (12/15 0356) Weight:  [73.5 kg] 73.5 kg (12/14 1107)    Intake/Output from previous day: 12/14 0701 - 12/15 0700 In: 240 [P.O.:240] Out: -  Intake/Output this shift: No intake/output data recorded.  PE: Gen: Awake and alert, NAD Lungs: Normal rate and effort Abd; Soft, ND, NT GU: along left intergluteal cleft there is induration and tenderness extending ~3cm from midline and extending ~2cm on right. Minimal heat. There is no obvious fluctuance, drainage, or erythema. This does not extend to the anus.   Lab Results:  Recent Labs    12/25/18 1115  WBC 26.4*  HGB 10.3*  HCT 33.6*  PLT 453*   BMET Recent Labs    12/25/18 1115  NA 138  K 4.2  CL 99  CO2 22  GLUCOSE 140*  BUN 55*  CREATININE 11.09*  CALCIUM 8.8*   PT/INR No results for input(s): LABPROT, INR in the last 72 hours. CMP     Component Value Date/Time   NA 138 12/25/2018 1115   NA 132 (L) 07/26/2016 0000   K 4.2 12/25/2018 1115   CL 99 12/25/2018 1115   CO2 22 12/25/2018 1115   GLUCOSE 140 (H) 12/25/2018 1115   BUN 55 (H) 12/25/2018 1115   BUN 39 (H) 07/26/2016 0000   CREATININE 11.09 (H) 12/25/2018 1115   CALCIUM 8.8 (L) 12/25/2018 1115   CALCIUM 8.4 (L) 06/21/2017 1939   PROT 7.2 12/25/2018 1115   PROT 6.7 07/21/2017 0959   ALBUMIN 2.4 (L) 12/25/2018 1115   ALBUMIN 3.2 (L) 07/21/2017 0959   AST 14 (L) 12/25/2018 1115   ALT 15 12/25/2018 1115   ALKPHOS 122 12/25/2018 1115   BILITOT 0.8 12/25/2018 1115   BILITOT <0.2 07/21/2017 0959   GFRNONAA 4 (L) 12/25/2018 1115   GFRAA 4 (L) 12/25/2018 1115   Lipase       Component Value Date/Time   LIPASE 15 12/25/2018 1115       Studies/Results: CT Abdomen Pelvis Wo Contrast  Result Date: 12/25/2018 CLINICAL DATA:  Lower back pain radiating to buttocks, hard knot in the sacral region. EXAM: CT ABDOMEN AND PELVIS WITHOUT CONTRAST TECHNIQUE: Multidetector CT imaging of the abdomen and pelvis was performed following the standard protocol without IV contrast. Patient unable to tolerate supine scanning, images obtained in a decubitus positioning. COMPARISON:  CT abdomen pelvis 03/17/2017 FINDINGS: Lower chest: Lung bases are clear. Normal heart size. No pericardial effusion. Hepatobiliary: No focal liver abnormality is seen. Patient is post cholecystectomy. Slight prominence of the biliary tree likely related to reservoir effect. No calcified intraductal gallstones. Pancreas: Unremarkable. No pancreatic ductal dilatation or surrounding inflammatory changes. Spleen: Normal in size without focal abnormality. Adrenals/Urinary Tract: Normal adrenal glands. Increasing nonspecific bilateral perinephric stranding. No visible or concerning renal lesions. No urolithiasis or hydronephrosis. Urinary bladder is unremarkable. Stomach/Bowel: Distal esophagus, stomach and duodenal sweep are unremarkable. No small bowel wall thickening or dilatation. No evidence of obstruction. The appendix is surgically absent. No colonic dilatation or wall thickening. Minimal rectal wall thickening. Vascular/Lymphatic: Atherosclerotic plaque within the normal caliber aorta. No suspicious  or enlarged lymph nodes in the included lymphatic chains. Reproductive: Uterus is surgically absent. No concerning adnexal lesions. Suspect retained ovarian tissue. Other: There is extensive soft tissue stranding and phlegmonous change in the superior gluteal cleft extending to the tip of the sacrum and towards the mesorectal fat within ill-defined possible fluid collection along the left lateral aspect of this  inflammation measuring approximately 2 x 1 x 1 cm in size. Inflammatory change extends to the levator plate with some hazy intersphincteric stranding which could suggest some transsphincteric extension. No soft tissue gas is seen. No free fluid or air in the abdomen or pelvis. Peritoneal catheter terminates in the left lower quadrant. Musculoskeletal: No features of subjacent osteomyelitis of the sacrum or coccyx. No acute osseous abnormality or suspicious osseous lesion. Mild levocurvature of the spine is likely positional. IMPRESSION: 1. Extensive stranding and phlegmonous change in the superior gluteal cleft and ischioanal fossa extending to the tip of the sacrum and towards the levator plate within ill-defined possible fluid collection along the left lateral aspect of this measuring approximately 2 x 1 x 1 cm in size possibly reflecting developing abscess. Inflammatory change extends to the levator plate with some hazy intersphincteric stranding which could suggest some transsphincteric extension. No soft tissue gas is seen. No features of subjacent osteomyelitis of the sacrum or coccyx. 2. Increasing bilateral nonspecific perinephric stranding. Could be related to patient's decreased renal function though could consider urinalysis to exclude superimposed urinary tract infection. 3. Peritoneal catheter, likely peritoneal dialysis catheter terminating in the left lower quadrant. 4.  Aortic Atherosclerosis (ICD10-I70.0). Electronically Signed   By: Lovena Le M.D.   On: 12/25/2018 16:15    Anti-infectives: Anti-infectives (From admission, onward)   Start     Dose/Rate Route Frequency Ordered Stop   12/26/18 0130  aztreonam (AZACTAM) 0.5 g in dextrose 5 % 50 mL IVPB     0.5 g 100 mL/hr over 30 Minutes Intravenous Every 8 hours 12/25/18 1858     12/26/18 0000  vancomycin variable dose per unstable renal function (pharmacist dosing)      Does not apply See admin instructions 12/25/18 1634     12/25/18  2200  aztreonam (AZACTAM) 0.5 g in dextrose 5 % 50 mL IVPB  Status:  Discontinued     0.5 g 100 mL/hr over 30 Minutes Intravenous Every 12 hours 12/25/18 1634 12/25/18 1646   12/25/18 1900  metroNIDAZOLE (FLAGYL) IVPB 500 mg     500 mg 100 mL/hr over 60 Minutes Intravenous Every 8 hours 12/25/18 1858     12/25/18 1700  vancomycin (VANCOCIN) 2,000 mg in sodium chloride 0.9 % 500 mL IVPB  Status:  Discontinued     2,000 mg 250 mL/hr over 120 Minutes Intravenous  Once 12/25/18 1648 12/25/18 2030   12/25/18 1646  aztreonam (AZACTAM) 0.5 g in dextrose 5 % 50 mL IVPB  Status:  Discontinued     0.5 g 100 mL/hr over 30 Minutes Intravenous Every 8 hours 12/25/18 1646 12/25/18 1851   12/25/18 1645  vancomycin (VANCOCIN) IVPB 1000 mg/200 mL premix  Status:  Discontinued     1,000 mg 200 mL/hr over 60 Minutes Intravenous  Once 12/25/18 1631 12/25/18 1634   12/25/18 1645  aztreonam (AZACTAM) 2 g in sodium chloride 0.9 % 100 mL IVPB  Status:  Discontinued     2 g 200 mL/hr over 30 Minutes Intravenous  Once 12/25/18 1631 12/25/18 1634   12/25/18 1645  vancomycin (VANCOCIN) IVPB 1000 mg/200 mL  premix  Status:  Discontinued     1,000 mg 200 mL/hr over 60 Minutes Intravenous  Once 12/25/18 1632 12/25/18 1634   12/25/18 1645  aztreonam (AZACTAM) 1 g in sodium chloride 0.9 % 100 mL IVPB  Status:  Discontinued     1 g 200 mL/hr over 30 Minutes Intravenous  Once 12/25/18 1632 12/25/18 1634   12/25/18 1645  vancomycin (VANCOCIN) 1,500 mg in sodium chloride 0.9 % 500 mL IVPB  Status:  Discontinued     1,500 mg 250 mL/hr over 120 Minutes Intravenous  Once 12/25/18 1634 12/25/18 1648       Assessment/Plan 63F s/p mechanical GLF Sacral phlegmon - Xrays and CT w/o bony injury. CT w/ 2 x 1 x 1 cm phlegmon on CT scan that likely is an infected hematoma after GLF on 12/9. Recommend continuation of broad-spectrum antibiotics (currently on aztreonam, flagyl, and vancomycin) and continued clinical monitoring.  Possibly may be a candidate for I&D with further clinical delineation of fluid collection, however, given the location of the phlegmon and her history of T2DM, would prefer to try to avoid this if possible. We will continue to follow.  FEN - Renal diet  VTE - Heparin for PD. On Plavix/ASA. Per primary team.  ID - Aztreonam/Flagyl/Vanc 12/14 >> WBC 26.4 (12/14) refused lab draw per notes today. Afebrile. BC pending. Primary team checking UA to r/o UTI given CT scan findings. Do not suspect that infected hematoma alone would cause rise in wbc this high.     LOS: 0 days    Susan Horton , [facility] 12/26/2018, 8:57 AM Please see Amion for pager number during day hours 7:00am-4:30pm

## 2018-12-26 NOTE — Progress Notes (Signed)
Pt refuse lab draw this am, phlebotomist attempted 2x, informed on call md.

## 2018-12-26 NOTE — Progress Notes (Signed)
Family Medicine Teaching Service Daily Progress Note Intern Pager: [phone]  Patient name: Susan Horton Medical record number: [id number] Date of birth: 12-Jan-1970 Age: 48 y.o. Gender: female  Primary Care Provider: Charlott Rakes, MD Consultants: Surgery Code Status: Code  Pt Overview and Major Events to Date:  12/25/2018-patient admitted to inpatient teaching service for sacral phlegmon  Assessment and Plan: Susan Horton is a 48 y.o. female presenting with sacral phlegmon. PMH is significant for ESRD on home PD, T2DM, HTN, CHF, PVD.  Sacral phlegmon Patient fell approximately a week ago and has had pain in her sacral area since that time.  She was seen on 12/12 in the ED and had a fall work-up done and was given pain medicine and discharged.  Patient return for worsening pain and increased heat and swelling.  In the ED patient was afebrile, WBC-24.6, CT abdomen and pelvis showed extensive stranding and phlegmonous changes in the superior gluteal cleft and ischio anal fossa extending to the tip of the sacrum and towards the levator plate with an ill-defined possible fluid collection along the left lateral aspect of this measuring 2X to X 1 cm in size possibly reflecting a developing abscess.    Blood cultures were drawn.  Surgery was consulted and recommended no intervention at this time but she may need it later. -Blood cultures pending -Vancomycin, metronidazole, aztreonam per pharmacy recs (12/14-) -General surgery consult, appreciate recommendations -Pain control with OxyIR 5 mg every 6 hours as needed for moderate pain and IV Dilaudid 1 mg every 6 hours as needed for severe breakthrough pain -Tylenol every 6 hours scheduled -Vitals per routine -Continue to hold home aspirin and Plavix   ESRD on home PD Patient gets nighttime home peritoneal dialysis.  Home medications include PhosLo, calcitriol.  Patient refused peritoneal dialysis last night 12/14.  Discussed dialysis with  patient this morning and she says she is willing to do it. -Nephrology consulted, appreciate recommendations -Continue home peritoneal dialysis schedule -Continue home medications  CAD Follows with [facility]. Patient has history of CAD and is on aspirin, Plavix and metoprolol.  Patient had drug-eluting stent placed in 2018. -Continue to hold aspirin and Plavix  -Continue home metoprolol  PAD Patient has history of significant peripheral artery disease.  She is status post right SFA intervention by Dr. Carlis Abbott on 12/14/2018.  Medications include Plavix, aspirin, lovastatin 20 mg daily -Continue home aspirin and Plavix -Switch to pravastatin 20 mg due to it being formulary in the hospital.  HFpEF Acute on chronic diastolic heart failure as listed in the patient's problem list and addressed in cardiology notes.  Patient recently had an echocardiogram as of 12/11 which showed an EF of 60 to 65% with normal left ventricular normal right ventricular function.  Echo from 2019 showed EF of 55 to 60% with G2DD.  Home medications as metoprolol XL -Monitor signs and symptoms of heart failure -Continue home metoprolol XL dose  T2 DM with peripheral neuropathy Patient's home medications include Toujeo 25 units twice daily and humulin SSI.  Hemoglobin A1c on 09/14/2017 was 8.3.  She also takes Lyrica for nerve pain.  Patient also takes amitriptyline 10 mg at bedtime. -Repeat hemoglobin A1c -Holding home Toujeo -Sensitive sliding scale -Continue home Lyrica and amitriptyline -CBGs with meals  Gastroparesis Patient has history of gastroparesis for which she takes metoclopramide 5 mg at home. -Continue home metoclopramide  HTN Blood pressures have ranged from 115/60-168/58. Patient's home medications include Toprol-XL 50 mg daily -Continue  home medications  Asthma Home medications include Singulair PRN at bedtime, Advair twice daily and Ventolin as needed. -Continue home asthma  medications -Holding Singulair at this time because it is as needed  FEN/GI: Renal/carb modified PPx: Heparin GGT   Disposition: Pending further evaluation  Subjective:  Patient has vomiting when I enter the room.  She says that this is what happens every morning after she eats and that is a normal part of her gastroparesis.  He vomited up all her medications that she took.  Patient reports that she is still in pain and is asking her next dose of medication is.  She says that her sacrum is still warm to touch but she does not feel like it is gotten worse.  She reports that her care has been much better since she was admitted.  I asked her about her peritoneal dialysis.  She says that she had trended down this morning and was thinking about running to push it back even further given her vomiting but says that she is okay with starting it when available.  She also says that she had labs drawn this morning but she does not know what labs were drawn.  Objective: Temp:  [97.6 F (36.4 C)-98.8 F (37.1 C)] 97.6 F (36.4 C) (12/15 0356) Pulse Rate:  [71-127] 111 (12/15 0356) Resp:  [16-24] 18 (12/15 0356) BP: (115-168)/(58-127) 139/65 (12/15 0356) SpO2:  [97 %-100 %] 100 % (12/15 0356) Weight:  [73.5 kg] 73.5 kg (12/14 1107) General: Laying on the right side looking and comfortable in bed.  She is vomiting when entered the room. HEENT: Atraumatic. Normocephalic.  Cardiac: RRR, no m/r/g Respiratory: CTAB, normal work of breathing Abdomen: soft, nontender, nondistended, bowel sounds normal, peritoneal dialysis catheter in place, there is no erythema or edema around insertion site. Skin: warm and dry, edema in sacral area is warm to touch. Neuro: alert and oriented   Laboratory: Recent Labs  Lab 12/25/18 1115  WBC 26.4*  HGB 10.3*  HCT 33.6*  PLT 453*   Recent Labs  Lab 12/25/18 1115  NA 138  K 4.2  CL 99  CO2 22  BUN 55*  CREATININE 11.09*  CALCIUM 8.8*  PROT 7.2  BILITOT  0.8  ALKPHOS 122  ALT 15  AST 14*  GLUCOSE 140*   Lactic acid-1.6  Imaging/Diagnostic Tests: CT Abdomen Pelvis Wo Contrast  Result Date: 12/25/2018 CLINICAL DATA:  Lower back pain radiating to buttocks, hard knot in the sacral region. EXAM: CT ABDOMEN AND PELVIS WITHOUT CONTRAST TECHNIQUE: Multidetector CT imaging of the abdomen and pelvis was performed following the standard protocol without IV contrast. Patient unable to tolerate supine scanning, images obtained in a decubitus positioning. COMPARISON:  CT abdomen pelvis 03/17/2017 FINDINGS: Lower chest: Lung bases are clear. Normal heart size. No pericardial effusion. Hepatobiliary: No focal liver abnormality is seen. Patient is post cholecystectomy. Slight prominence of the biliary tree likely related to reservoir effect. No calcified intraductal gallstones. Pancreas: Unremarkable. No pancreatic ductal dilatation or surrounding inflammatory changes. Spleen: Normal in size without focal abnormality. Adrenals/Urinary Tract: Normal adrenal glands. Increasing nonspecific bilateral perinephric stranding. No visible or concerning renal lesions. No urolithiasis or hydronephrosis. Urinary bladder is unremarkable. Stomach/Bowel: Distal esophagus, stomach and duodenal sweep are unremarkable. No small bowel wall thickening or dilatation. No evidence of obstruction. The appendix is surgically absent. No colonic dilatation or wall thickening. Minimal rectal wall thickening. Vascular/Lymphatic: Atherosclerotic plaque within the normal caliber aorta. No suspicious or enlarged lymph nodes in  the included lymphatic chains. Reproductive: Uterus is surgically absent. No concerning adnexal lesions. Suspect retained ovarian tissue. Other: There is extensive soft tissue stranding and phlegmonous change in the superior gluteal cleft extending to the tip of the sacrum and towards the mesorectal fat within ill-defined possible fluid collection along the left lateral aspect of  this inflammation measuring approximately 2 x 1 x 1 cm in size. Inflammatory change extends to the levator plate with some hazy intersphincteric stranding which could suggest some transsphincteric extension. No soft tissue gas is seen. No free fluid or air in the abdomen or pelvis. Peritoneal catheter terminates in the left lower quadrant. Musculoskeletal: No features of subjacent osteomyelitis of the sacrum or coccyx. No acute osseous abnormality or suspicious osseous lesion. Mild levocurvature of the spine is likely positional. IMPRESSION: 1. Extensive stranding and phlegmonous change in the superior gluteal cleft and ischioanal fossa extending to the tip of the sacrum and towards the levator plate within ill-defined possible fluid collection along the left lateral aspect of this measuring approximately 2 x 1 x 1 cm in size possibly reflecting developing abscess. Inflammatory change extends to the levator plate with some hazy intersphincteric stranding which could suggest some transsphincteric extension. No soft tissue gas is seen. No features of subjacent osteomyelitis of the sacrum or coccyx. 2. Increasing bilateral nonspecific perinephric stranding. Could be related to patient's decreased renal function though could consider urinalysis to exclude superimposed urinary tract infection. 3. Peritoneal catheter, likely peritoneal dialysis catheter terminating in the left lower quadrant. 4.  Aortic Atherosclerosis (ICD10-I70.0). Electronically Signed   By: Lovena Le M.D.   On: 12/25/2018 16:15     Gifford Shave, MD 12/26/2018, 6:17 AM PGY-1, [place] Intern pager: [phone], text pages welcome

## 2018-12-27 ENCOUNTER — Encounter (HOSPITAL_COMMUNITY): Payer: Self-pay | Admitting: Family Medicine

## 2018-12-27 ENCOUNTER — Other Ambulatory Visit: Payer: Self-pay

## 2018-12-27 ENCOUNTER — Inpatient Hospital Stay (HOSPITAL_COMMUNITY): Payer: Medicare Other

## 2018-12-27 DIAGNOSIS — L0231 Cutaneous abscess of buttock: Secondary | ICD-10-CM

## 2018-12-27 HISTORY — DX: Cutaneous abscess of buttock: L02.31

## 2018-12-27 LAB — CBC
HCT: 28.1 % — ABNORMAL LOW (ref 36.0–46.0)
Hemoglobin: 8.7 g/dL — ABNORMAL LOW (ref 12.0–15.0)
MCH: 26.2 pg (ref 26.0–34.0)
MCHC: 31 g/dL (ref 30.0–36.0)
MCV: 84.6 fL (ref 80.0–100.0)
Platelets: 432 10*3/uL — ABNORMAL HIGH (ref 150–400)
RBC: 3.32 MIL/uL — ABNORMAL LOW (ref 3.87–5.11)
RDW: 14.2 % (ref 11.5–15.5)
WBC: 38.9 10*3/uL — ABNORMAL HIGH (ref 4.0–10.5)
nRBC: 0 % (ref 0.0–0.2)

## 2018-12-27 LAB — BASIC METABOLIC PANEL
Anion gap: 14 (ref 5–15)
BUN: 55 mg/dL — ABNORMAL HIGH (ref 6–20)
CO2: 22 mmol/L (ref 22–32)
Calcium: 8.4 mg/dL — ABNORMAL LOW (ref 8.9–10.3)
Chloride: 99 mmol/L (ref 98–111)
Creatinine, Ser: 10.2 mg/dL — ABNORMAL HIGH (ref 0.44–1.00)
GFR calc Af Amer: 5 mL/min — ABNORMAL LOW (ref >60)
GFR calc non Af Amer: 4 mL/min — ABNORMAL LOW (ref >60)
Glucose, Bld: 180 mg/dL — ABNORMAL HIGH (ref 70–99)
Potassium: 3.7 mmol/L (ref 3.5–5.1)
Sodium: 135 mmol/L (ref 135–145)

## 2018-12-27 LAB — GLUCOSE, CAPILLARY
Glucose-Capillary: 155 mg/dL — ABNORMAL HIGH (ref 70–99)
Glucose-Capillary: 140 mg/dL — ABNORMAL HIGH (ref 70–99)
Glucose-Capillary: 128 mg/dL — ABNORMAL HIGH (ref 70–99)
Glucose-Capillary: 242 mg/dL — ABNORMAL HIGH (ref 70–99)

## 2018-12-27 LAB — BODY FLUID CELL COUNT WITH DIFFERENTIAL
Eos, Fluid: 1 %
Lymphs, Fluid: 1 %
Monocyte-Macrophage-Serous Fluid: 3 % — ABNORMAL LOW (ref 50–90)
Neutrophil Count, Fluid: 95 % — ABNORMAL HIGH (ref 0–25)
Total Nucleated Cell Count, Fluid: 333 cu mm (ref 0–1000)

## 2018-12-27 MED ORDER — DARBEPOETIN ALFA 60 MCG/0.3ML IJ SOSY
60.0000 ug | PREFILLED_SYRINGE | INTRAMUSCULAR | Status: DC
  Administered 2018-12-27: 60 ug via SUBCUTANEOUS
  Filled 2018-12-27: qty 0.3

## 2018-12-27 MED ORDER — HEPARIN SODIUM (PORCINE) 5000 UNIT/ML IJ SOLN
5000.0000 [IU] | Freq: Three times a day (TID) | INTRAMUSCULAR | Status: DC
  Administered 2018-12-27 – 2018-12-31 (×14): 5000 [IU] via SUBCUTANEOUS
  Filled 2018-12-27 (×14): qty 1

## 2018-12-27 MED ORDER — IOHEXOL 300 MG/ML  SOLN: 100.0000 mL | Freq: Once | INTRAMUSCULAR | Status: DC | PRN

## 2018-12-27 MED ORDER — HYDROMORPHONE HCL 1 MG/ML IJ SOLN
1.0000 mg | INTRAMUSCULAR | Status: DC | PRN
  Administered 2018-12-27 – 2018-12-28 (×5): 1 mg via INTRAVENOUS
  Filled 2018-12-27 (×7): qty 1

## 2018-12-27 MED ORDER — IOHEXOL 300 MG/ML  SOLN
100.0000 mL | Freq: Once | INTRAMUSCULAR | Status: AC | PRN
  Administered 2018-12-27: 100 mL via INTRAVENOUS

## 2018-12-27 MED ORDER — OXYCODONE HCL 5 MG PO TABS
5.0000 mg | ORAL_TABLET | ORAL | Status: DC | PRN
  Administered 2018-12-27 (×2): 5 mg via ORAL
  Filled 2018-12-27 (×2): qty 1

## 2018-12-27 MED ORDER — WHITE PETROLATUM EX OINT
TOPICAL_OINTMENT | CUTANEOUS | Status: AC
  Administered 2018-12-27: 0.2
  Filled 2018-12-27: qty 28.35

## 2018-12-27 NOTE — Progress Notes (Addendum)
[facility] Progress Note   Dialysis Orders: CCPD - 1 2.5 5 L bag and 1 1.5 5 L bag - 5 fills - 2 L fills with 1 L in last fill dwell 1.5 hr   Assessment/Plan: 1. Gluteal abscess - WBC 25 > 33 K > 38.9 , seen by surgery - trying to avoid surgery due to DM/empiric antibiotics per primary- but pain worsening and WBC ^ despite antibiotics - surgery following closely  2.  Gastroparesis - chronic problem -on chronic Reglan 5 mg ; states phenergan and Zofran not effective  3. ? peritonitis - cell count 333 - with 95% neutrophils - culture sent - current antibiotic regimen would cover most organisms.? Leak from abscess. Follow cell count daily w PD 3. ESRD - PD -  K 4.5 CO2 18 - needs dialysis - resumed last night -  Has an AVF so could easily transition to HD if she had surgery for abscess that might warrant a holiday from PD  4. Anemia - hgb 10.3 -> 8.7  hold oral Fe to lessen pill burden with gastroparesis - start Aranesp 60 - has had in the past here - gets Mircera as outpatient (last dose in Sept) 5. Secondary hyperparathyroidism - Ca 8.4 on 3 Ca acetate ac - hold for now to lessen pill burden- resume at d/c - continue calcitriol  6. HTN/volume - BP variable- meds resumed but still vomiting some  Poor intake - no excess on exam- use all 1.5% tonight -doubt she will be able to eat or drink much today - net UF 320 cc  7. Nutrition - alb 2.4 - can't eat at present due to N and V and severe pain 8 . DM- per primary  Myriam Jacobson, PA-C [place] [phone] 12/27/2018,10:03 AM  LOS: 1 day   Subjective:   Severe pain from abscess - no abdominal pain at all. Surgery increasing pain meds.  Can't eat.  Taking sips only. Dialysis went fine last night.  Objective Vitals:   12/26/18 1715 12/26/18 1919 12/26/18 2101 12/27/18 0710  BP: 118/74  (!) 147/65 (!) 147/63  Pulse: 97  (!) 103 96  Resp: 18  17   Temp: 99.8 F (37.7 C)  98.8 F (37.1 C) 98.9 F (37.2  C)  TempSrc: Oral  Oral Oral  SpO2: 98% 96% 100% 98%  Weight: 73.2 kg     Height:       Physical Exam General: breathing easily, very uncomfortable from pain Heart: RRR Lungs: no rales Abdomen: soft NT PD cath intact Buttocks: swelling and tenderness with displacement of gluteal clef - hurts to move Extremities: no edema Dialysis Access: left upper AVF + bruit and PD cath   Additional Objective Labs: Basic Metabolic Panel: Recent Labs  Lab 12/25/18 1115 12/26/18 1047 12/27/18 0842  NA 138 134* 135  K 4.2 4.5 3.7  CL 99 101 99  CO2 22 18* 22  GLUCOSE 140* 217* 180*  BUN 55* 59* 55*  CREATININE 11.09* 11.09* 10.20*  CALCIUM 8.8* 8.5* 8.4*  PHOS  --  5.7*  --    Liver Function Tests: Recent Labs  Lab 12/25/18 1115 12/26/18 1047  AST 14*  --   ALT 15  --   ALKPHOS 122  --   BILITOT 0.8  --   PROT 7.2  --   ALBUMIN 2.4* 2.0*   Recent Labs  Lab 12/25/18 1115  LIPASE 15   CBC: Recent Labs  Lab  12/25/18 1115 12/26/18 1047 12/27/18 0842  WBC 26.4* 33.2* 38.9*  NEUTROABS  --  30.2*  --   HGB 10.3* 8.9* 8.7*  HCT 33.6* 28.8* 28.1*  MCV 86.2 85.0 84.6  PLT 453* 425* 432*   Blood Culture    Component Value Date/Time   SDES BLOOD RIGHT ANTECUBITAL 12/25/2018 1254   SPECREQUEST  12/25/2018 1254    BOTTLES DRAWN AEROBIC AND ANAEROBIC Blood Culture adequate volume   CULT  12/25/2018 1254    NO GROWTH 1 DAY Performed at [facility], [place] [address] [place], [place] [address]    REPTSTATUS PENDING 12/25/2018 1254    Cardiac Enzymes: No results for input(s): CKTOTAL, CKMB, CKMBINDEX, TROPONINI in the last 168 hours. CBG: Recent Labs  Lab 12/26/18 0754 12/26/18 1152 12/26/18 1647 12/26/18 2201 12/27/18 0808  GLUCAP 141* 195* 188* 205* 155*   Iron Studies: No results for input(s): IRON, TIBC, TRANSFERRIN, FERRITIN in the last 72 hours. Lab Results  Component Value Date   INR 0.91 02/01/2017   INR 0.9 07/26/2016   INR 1.07 10/23/2014    Studies/Results: CT Abdomen Pelvis Wo Contrast  Result Date: 12/25/2018 CLINICAL DATA:  Lower back pain radiating to buttocks, hard knot in the sacral region. EXAM: CT ABDOMEN AND PELVIS WITHOUT CONTRAST TECHNIQUE: Multidetector CT imaging of the abdomen and pelvis was performed following the standard protocol without IV contrast. Patient unable to tolerate supine scanning, images obtained in a decubitus positioning. COMPARISON:  CT abdomen pelvis 03/17/2017 FINDINGS: Lower chest: Lung bases are clear. Normal heart size. No pericardial effusion. Hepatobiliary: No focal liver abnormality is seen. Patient is post cholecystectomy. Slight prominence of the biliary tree likely related to reservoir effect. No calcified intraductal gallstones. Pancreas: Unremarkable. No pancreatic ductal dilatation or surrounding inflammatory changes. Spleen: Normal in size without focal abnormality. Adrenals/Urinary Tract: Normal adrenal glands. Increasing nonspecific bilateral perinephric stranding. No visible or concerning renal lesions. No urolithiasis or hydronephrosis. Urinary bladder is unremarkable. Stomach/Bowel: Distal esophagus, stomach and duodenal sweep are unremarkable. No small bowel wall thickening or dilatation. No evidence of obstruction. The appendix is surgically absent. No colonic dilatation or wall thickening. Minimal rectal wall thickening. Vascular/Lymphatic: Atherosclerotic plaque within the normal caliber aorta. No suspicious or enlarged lymph nodes in the included lymphatic chains. Reproductive: Uterus is surgically absent. No concerning adnexal lesions. Suspect retained ovarian tissue. Other: There is extensive soft tissue stranding and phlegmonous change in the superior gluteal cleft extending to the tip of the sacrum and towards the mesorectal fat within ill-defined possible fluid collection along the left lateral aspect of this inflammation measuring approximately 2 x 1 x 1 cm in size. Inflammatory  change extends to the levator plate with some hazy intersphincteric stranding which could suggest some transsphincteric extension. No soft tissue gas is seen. No free fluid or air in the abdomen or pelvis. Peritoneal catheter terminates in the left lower quadrant. Musculoskeletal: No features of subjacent osteomyelitis of the sacrum or coccyx. No acute osseous abnormality or suspicious osseous lesion. Mild levocurvature of the spine is likely positional. IMPRESSION: 1. Extensive stranding and phlegmonous change in the superior gluteal cleft and ischioanal fossa extending to the tip of the sacrum and towards the levator plate within ill-defined possible fluid collection along the left lateral aspect of this measuring approximately 2 x 1 x 1 cm in size possibly reflecting developing abscess. Inflammatory change extends to the levator plate with some hazy intersphincteric stranding which could suggest some transsphincteric extension. No soft tissue gas  is seen. No features of subjacent osteomyelitis of the sacrum or coccyx. 2. Increasing bilateral nonspecific perinephric stranding. Could be related to patient's decreased renal function though could consider urinalysis to exclude superimposed urinary tract infection. 3. Peritoneal catheter, likely peritoneal dialysis catheter terminating in the left lower quadrant. 4.  Aortic Atherosclerosis (ICD10-I70.0). Electronically Signed   By: Lovena Le M.D.   On: 12/25/2018 16:15   Medications: . aztreonam 0.5 g (12/27/18 0837)  . dialysis solution 1.5% low-MG/low-CA    . metronidazole 500 mg (12/27/18 0400)   . acetaminophen  650 mg Oral Q6H  . amitriptyline  10 mg Oral QHS  . calcitRIOL  1.5 mcg Oral Daily  . gentamicin cream  1 application Topical Daily  . heparin  5,000 Units Subcutaneous Q8H  . insulin aspart  0-6 Units Subcutaneous TID WC  . metoCLOPramide  5 mg Oral TID AC & HS  . metoprolol succinate  50 mg Oral Daily  . mometasone-formoterol  2 puff  Inhalation BID  . oxyCODONE  5 mg Oral Q6H  . pantoprazole  40 mg Oral Daily  . pravastatin  20 mg Oral q1800  . pregabalin  75 mg Oral Daily  . vancomycin variable dose per unstable renal function (pharmacist dosing)   Does not apply See admin instructions

## 2018-12-27 NOTE — Discharge Summary (Addendum)
[facility] Discharge Summary  Patient name: Susan Horton Medical record number: [id number] Date of birth: June 30, 1970 Age: 48 y.o. Gender: female Date of Admission: 12/25/2018  Date of Discharge: 01/05/19 Admitting Physician: Gifford Shave, MD  Primary Care Provider: Charlott Rakes, MD Consultants: ID, General surgery, GI   Indication for Hospitalization: Sacral phlegmon   Discharge Diagnoses/Problem List:  Sacral phelgmon ESRD Coronary artery disease Peripheral artery disease HFpEF Type 2 diabetes with peripheral neuropathy Hypertension Asthma   Disposition: Home   Discharge Condition: Medically stable for discharge   Discharge Exam:   General: Alert and cooperative and appears to be in no acute distress Cardio: Normal S1 and S2, RRR. No murmurs or rubs.   Pulm:  CTAB, Normal respiratory effort Abdomen: Bowel sounds normal. Abdomen soft and non-tender.  GU: Bilateral gluteal swelling, tender and firm on palpation. No significant erythema.  Extremities: No peripheral edema. Warm/ well perfused.  Strong radial pulse Neuro: Cranial nerves grossly intact   Brief Hospital Course:   Sacral phelgmon  Patient admitted on 12/14 complaining of pain in her buttocks which was swollen and warm to touch following a fall a few days prior where she came into the ER, she had some xrays and d/c with Toradol. CT abdo pelvis showed extensive stranding and phlegmonous changes in her superior gluteal cleft and ischio anal fossa extending to the tip of the sacrum. Surgery was consulted who recommended IV Vancomycin, Metronidazole, and Aztreonam but no surgical intervention at that time were started. Pain management was continued as were IV antibiotics. Antibiotics switched to Linezolid to cover for MRSA on 12/18 per ID's recommendation given significantly elevated WBC to 40s, max 49.  Pt had aspiration of perirectal abscess which appears to be an old hematoma. MRI  buttock showed: Peri sacral and coccygeal fluid collection with stranding extending into the levator plate and sphincter complex. Mild increased T2 signal in sacrum and coccyx. Given recent history of trauma this is nonspecific, osteomyelitis or posttraumatic changes. Given proximity of fluid osteomyelitis is favored. IR also performed US guided aspiration which revealed only few ccs of yellow fluid.  Given concern for possible GI seeding given close proximity, GI was consulted for flex-sig, see results below.  Pt was discharged with 40 x 72m oxycodone for pain secondary to sacral phlegmon. At the time of discharge, labs were improving, pain was well-controlled, and patient had stable vital signs.  ESRD Pt continued on PD in hospital. No evidence of PD peritonitis and no evidence of abscess communicating of abscess with peritoneum.    Leukocytosis WBC count 24.6>33>42>49 Max>37 improved with antibiotics and down-trending on discharge likely 2/2 acute inflammatory process however eitology remains unclear on discharge: abscess VS fat necrosis Vs other. ID recommended bone marrow biopsy and referral to Hematology as OP.    Thrombocytosis Platelets 453 on admission. Have persistently remained elevated throughout, max 564. Platelets on discharge 468. ID recommended bone marrow biopsy and referral to Hematology as OP.   Anemia  Hb on admission 10.3. Was monitored daily. Dropped to 6.8 on 12.21 and received 2URBC total throughout admission. Hb on discharge is 9.7.  Concern for GI malignancy GI consulted on 12/23 due to climbing WBC and perisacral and coccygeal fluid collection stranding extending into levator plate and sphincter complex. Sigmoidoscopy on 12/24 showed internal and external hemorrhoids, otherwise negative.   Pts vitals were stable and she was medically stable for discharge on 12/25.   Issues for Follow Up:  Monitor Hb, pt  required RBC transfusion later   Consider bone marrow biopsy and  referral to Hematology for persistent leukocytosis Follow up with Cardiology Follow up with ID-Dr Tommy Medal. May consider further imaging.  She was discharged with linezolid. Restart Tougeo if CBGs are elevated   Significant Procedures:  RBC 1 unit transfusion  Aspiration sacral phlegmon US guided aspiration sacral phlegmon Sigmoidoscopy   Significant Labs and Imaging:  Recent Labs  Lab 01/03/19 0208 01/04/19 1907 01/05/19 0454  WBC 40.9* 41.3* 37.7*  HGB 8.6* 8.4* 7.9*  HCT 26.2* 25.4* 24.0*  PLT 500* 475* 468*   Recent Labs  Lab 01/01/19 0416 01/02/19 0227 01/02/19 0701 01/03/19 0208 01/04/19 1209 01/05/19 0454  NA 128* 127* 128* 129* 130* 133*  K 3.3* 2.9* 2.9* 3.1* 3.3* 3.5  CL 89* 88* 87* 89* 94* 94*  CO2 _0 GLUCOSE 98 134* 80 150* 75 108*  BUN 53* 52* 55* 52* 55* 54*  CREATININE 8.57* 8.27* 8.58* 7.96* 8.01* 7.99*  CALCIUM 8.3* 8.2* 8.6* 8.5* 8.5* 8.6*  PHOS 6.1*  --   --  5.8* 6.4* 6.8*  ALBUMIN  --   --   --  1.4* 1.5* 1.3*      Results/Tests Pending at Time of Discharge:   Discharge Medications:  Allergies as of 01/05/2019       Reactions   Repatha [evolocumab] Itching   Lisinopril Cough        Medication List     STOP taking these medications    HYDROcodone-acetaminophen 5-325 MG tablet Commonly known as: NORCO/VICODIN   traMADol 50 MG tablet Commonly known as: ULTRAM       TAKE these medications    albuterol 108 (90 Base) MCG/ACT inhaler Commonly known as: VENTOLIN HFA Inhale 1-2 puffs into the lungs every 6 (six) hours as needed for wheezing or shortness of breath.   amitriptyline 10 MG tablet Commonly known as: ELAVIL Take 1 tablet (10 mg total) by mouth at bedtime.   aspirin 81 MG chewable tablet Chew 1 tablet (81 mg total) by mouth daily.   calcitRIOL 0.5 MCG capsule Commonly known as: ROCALTROL Take 1.5 mcg by mouth daily.   calcium acetate 667 MG capsule Commonly known as: PHOSLO Take 1,334-2,001  mg by mouth See admin instructions. Take three capsules (2001 mg) by mouth up to three times daily with meals and two capsules (1334 mg) with snacks   clopidogrel 75 MG tablet Commonly known as: PLAVIX Take 1 tablet (75 mg total) by mouth daily with breakfast.   DIALYVITE 800 WITH ZINC 0.8 MG Tabs Take 1 tablet by mouth daily.   Fluticasone-Salmeterol 250-50 MCG/DOSE Aepb Commonly known as: ADVAIR Inhale 1 puff into the lungs 2 (two) times daily. What changed:  when to take this reasons to take this   gentamicin cream 0.1 % Commonly known as: GARAMYCIN Apply 1 application topically See admin instructions. Apply to access site nightly   insulin regular 100 units/mL injection Commonly known as: HumuLIN R Inject 0.2-0.25 mLs (20-25 Units total) into the skin See admin instructions. What changed:  how much to take when to take this additional instructions   Iron 325 (65 Fe) MG Tabs Take 325 mg by mouth daily.   isosorbide mononitrate 30 MG 24 hr tablet Commonly known as: IMDUR Take 0.5 tablets (15 mg total) by mouth daily.   lidocaine-prilocaine cream Commonly known as: EMLA Apply 1 application topically See admin instructions. Apply small amount to access site (AVF)  30 minutes before hemodialysis, cover with occlusive dressing (Saran Wrap) if going out to dialysis center   linezolid 600 MG tablet Commonly known as: ZYVOX Take 1 tablet (600 mg total) by mouth 2 (two) times daily for 14 days.   lovastatin 20 MG tablet Commonly known as: MEVACOR Take 20 mg by mouth at bedtime.   methocarbamol 500 MG tablet Commonly known as: Robaxin Take 1 tablet (500 mg total) by mouth 2 (two) times daily as needed for muscle spasms.   metoCLOPramide 5 MG tablet Commonly known as: REGLAN TAKE 1 TABLET BY MOUTH 4 TIMES DAILY BEFORE MEALS AND AT BEDTIME. What changed: See the new instructions.   metoprolol succinate 50 MG 24 hr tablet Commonly known as: TOPROL-XL Take 1 tablet (50  mg total) by mouth daily.   montelukast 10 MG tablet Commonly known as: SINGULAIR Take 1 tablet (10 mg total) by mouth at bedtime. What changed:  when to take this reasons to take this   mupirocin cream 2 % Commonly known as: BACTROBAN Apply 1 application topically 2 (two) times daily as needed. To affected areas and cover with non-stick gauze.   nitroGLYCERIN 0.4 MG SL tablet Commonly known as: Nitrostat Place 1 tablet (0.4 mg total) under the tongue every 5 (five) minutes as needed for chest pain.   Oxycodone HCl 10 MG Tabs Take 0.5 tablets (5 mg total) by mouth every 6 (six) hours as needed for up to 5 days for severe pain. 1-2 TABLETS EVERY 6 HOURS FOR PAIN   pantoprazole 40 MG tablet Commonly known as: PROTONIX TAKE 1 TABLET BY MOUTH DAILY.   polyethylene glycol 17 g packet Commonly known as: MIRALAX / GLYCOLAX Take 17 g by mouth 2 (two) times daily for 7 days.   pregabalin 75 MG capsule Commonly known as: Lyrica Take 1 capsule (75 mg total) by mouth daily.   senna-docusate 8.6-50 MG tablet Commonly known as: Senokot-S Take 1 tablet by mouth 2 (two) times daily for 7 days. What changed:  when to take this reasons to take this   Toujeo SoloStar 300 UNIT/ML Sopn Generic drug: Insulin Glargine (1 Unit Dial) Inject 10 Units into the skin 2 (two) times daily. What changed:  how much to take when to take this   trimethoprim-polymyxin b ophthalmic solution Commonly known as: POLYTRIM Place 1 drop into both eyes See admin instructions. Instill one drop into both eyes four times daily for 2 days after each monthly eye injection               Durable Medical Equipment  (From admission, onward)           Start     Ordered   01/04/19 1550  For home use only DME 4 wheeled rolling walker with seat  Once    Question:  Patient needs a walker to treat with the following condition  Answer:  Weakness   01/04/19 1549            Discharge Instructions:  Please refer to Patient Instructions section of EMR for full details.  Patient was counseled important signs and symptoms that should prompt return to medical care, changes in medications, dietary instructions, activity restrictions, and follow up appointments.   Follow-Up Appointments: Follow-up Information     Charlott Rakes, MD Follow up in 3 day(s).   Specialty: Family Medicine Why: Follow up with PCP in 3 days  Contact information: [place] [address] [phone]  [name], MD .   Specialties: Cardiology, Radiology Contact information: [address] [place] [address] [phone]            Lattie Haw, MD 01/05/2019, 12:12 PM PGY-1, [facility]  .FPTS Upper-Level Resident Addendum   I have discussed the above with the original author and agree with their documentation. My edits for correction/addition/clarification have been made.  Please see also any attending notes.   [place] Constable, D.O. PGY-2, [facility] 01/08/2019 7:58 AM

## 2018-12-27 NOTE — Progress Notes (Signed)
Patient with significant pain overnight despite scheduled oxycodone and PRN dilaudid. On call MD paged for additional medication and/or change of medication. Awaiting response. Will notify dayshift RN to discuss with primary team on rounds.

## 2018-12-27 NOTE — Progress Notes (Signed)
Family Medicine Teaching Service Daily Progress Note Intern Pager: [phone]  Patient name: Susan Horton Medical record number: [id number] Date of birth: 01/22/1970 Age: 48 y.o. Gender: female  Primary Care Provider: Charlott Rakes, MD Consultants: Surgery Code Status: Code  Pt Overview and Major Events to Date:  12/25/2018-patient admitted to inpatient teaching service for sacral phlegmon  Assessment and Plan: Susan Horton is a 48 y.o. female presenting with sacral phlegmon. PMH is significant for ESRD on home PD, T2DM, HTN, CHF, PVD.  Sacral phlegmon Patient fell approximately a week ago and has had pain in her sacral area since that time.  She was seen on 12/12 in the ED and had a fall work-up done and was given pain medicine and discharged.  Patient return for worsening pain and increased heat and swelling.  In the ED patient was afebrile, WBC-24.6, CT abdomen and pelvis showed extensive stranding and phlegmonous changes in the superior gluteal cleft and ischio anal fossa extending to the tip of the sacrum and towards the levator plate with an ill-defined possible fluid collection along the left lateral aspect of this measuring 2X to X 1 cm in size possibly reflecting a developing abscess.    Blood cultures were drawn.  Surgery was consulted and is following.  Patient worsened overnight with continuing pain that is uncontrolled on scheduled oxycodone, Dilaudid, Tylenol.  Given patient's increasing white count although she is already on IV antibiotics I am concerned for poor source control.  Surgery PA is seen the patient and the attending is going to evaluate for possible OR I&D. -Blood cultures pending -Vancomycin, metronidazole, aztreonam per pharmacy recs (12/14-) -General surgery consult, appreciate recommendations -Pain control with scheduled OxyIR, Dilaudid, Tylenol. -Tylenol every 6 hours scheduled -Vitals per routine -Continue to hold home aspirin and Plavix -N.p.o. at this  time  Constipation Patient reports that she has not had a bowel movement in over a week.  She says that this is because it hurts for her to strain to try and have a bowel movement.  She tried to push but nothing comes out.  Patient may go to surgery today and would rather deal with the constipation prior to her procedure rather than after. -Fleet enema -Monitor bowel movement status  ESRD on home PD Patient gets nighttime home peritoneal dialysis.  Home medications include PhosLo, calcitriol.  Patient had peritoneal dialysis yesterday 12/15.  Fluid was clear, culture and cell count were obtained.  No history of peritonitis. -Nephrology consulted, appreciate recommendations -Continue home peritoneal dialysis schedule -Continue home medications  CAD Follows with [facility]. Patient has history of CAD and is on aspirin, Plavix and metoprolol.  Patient had drug-eluting stent placed in 2018. -Continue to hold aspirin and Plavix  -Continue home metoprolol  PAD Patient has history of significant peripheral artery disease.  She is status post right SFA intervention by Dr. Carlis Abbott on 12/14/2018.  Medications include Plavix, aspirin, lovastatin 20 mg daily -Holding home aspirin and Plavix -Switch to pravastatin 20 mg due to it being formulary in the hospital.  HFpEF Acute on chronic diastolic heart failure as listed in the patient's problem list and addressed in cardiology notes.  Patient recently had an echocardiogram as of 12/11 which showed an EF of 60 to 65% with normal left ventricular normal right ventricular function.  Echo from 2019 showed EF of 55 to 60% with G2DD.  Home medications as metoprolol XL -Monitor signs and symptoms of heart failure -Continue home metoprolol XL dose  T2 DM with peripheral neuropathy Patient's home medications include Toujeo 25 units twice daily and humulin SSI.  Hemoglobin A1c on 09/14/2017 was 8.3.  She also takes Lyrica for nerve pain.  Patient  also takes amitriptyline 10 mg at bedtime.  Repeat hemoglobin A1c was 7.5 -Holding home Toujeo -Sensitive sliding scale -Continue home Lyrica and amitriptyline -CBGs with meals  Gastroparesis Patient has history of gastroparesis for which she takes metoclopramide 5 mg at home. -Continue home metoclopramide  HTN Blood pressures have ranged from 115/60-168/58. Patient's home medications include Toprol-XL 50 mg daily -Continue home medications  Asthma Home medications include Singulair PRN at bedtime, Advair twice daily and Ventolin as needed. -Continue home asthma medications -Holding Singulair at this time because it is as needed  FEN/GI: Renal/carb modified PPx: SCDs  Disposition: Pending further evaluation  Subjective:  Patient is still having a large amount of pain.  She reports that she just cannot get comfortable and feels like her sacral abscess is getting larger.  Patient also has concerns because she has not had a bowel movement in over a week and she was trying to have a bowel movement but is unable to do so due to pain and straining.  I discussed enema versus medication management.  Objective: Temp:  [98.8 F (37.1 C)-99.8 F (37.7 C)] 98.8 F (37.1 C) (12/15 2101) Pulse Rate:  [97-108] 103 (12/15 2101) Resp:  [15-18] 17 (12/15 2101) BP: (118-154)/(62-74) 147/65 (12/15 2101) SpO2:  [96 %-100 %] 100 % (12/15 2101) Weight:  [73.2 kg] 73.2 kg (12/15 1715) General: Lying on left side I enter the room.  She looks very uncomfortable and very tired.  She keeps rubbing her backside because she says that it hurts so bad. HEENT: Atraumatic. Normocephalic.  Cardiac: RRR, no m/r/g Respiratory: CTAB, normal work of breathing Abdomen: Soft, nontender, nondistended.  Peritoneal dialysis catheter in place, there is no erythema or edema around insertion site. Skin: warm and dry, edema in sacral area which is warm to touch and feels more indurated than previous exam. Neuro: alert  and oriented   Laboratory: Recent Labs  Lab 12/25/18 1115 12/26/18 1047  WBC 26.4* 33.2*  HGB 10.3* 8.9*  HCT 33.6* 28.8*  PLT 453* 425*   Recent Labs  Lab 12/25/18 1115 12/26/18 1047  NA 138 134*  K 4.2 4.5  CL 99 101  CO2 22 18*  BUN 55* 59*  CREATININE 11.09* 11.09*  CALCIUM 8.8* 8.5*  PROT 7.2  --   BILITOT 0.8  --   ALKPHOS 122  --   ALT 15  --   AST 14*  --   GLUCOSE 140* 217*   Lactic acid-1.6  Imaging/Diagnostic Tests: CT Abdomen Pelvis Wo Contrast  Result Date: 12/25/2018 CLINICAL DATA:  Lower back pain radiating to buttocks, hard knot in the sacral region. EXAM: CT ABDOMEN AND PELVIS WITHOUT CONTRAST TECHNIQUE: Multidetector CT imaging of the abdomen and pelvis was performed following the standard protocol without IV contrast. Patient unable to tolerate supine scanning, images obtained in a decubitus positioning. COMPARISON:  CT abdomen pelvis 03/17/2017 FINDINGS: Lower chest: Lung bases are clear. Normal heart size. No pericardial effusion. Hepatobiliary: No focal liver abnormality is seen. Patient is post cholecystectomy. Slight prominence of the biliary tree likely related to reservoir effect. No calcified intraductal gallstones. Pancreas: Unremarkable. No pancreatic ductal dilatation or surrounding inflammatory changes. Spleen: Normal in size without focal abnormality. Adrenals/Urinary Tract: Normal adrenal glands. Increasing nonspecific bilateral perinephric stranding. No visible or concerning renal  lesions. No urolithiasis or hydronephrosis. Urinary bladder is unremarkable. Stomach/Bowel: Distal esophagus, stomach and duodenal sweep are unremarkable. No small bowel wall thickening or dilatation. No evidence of obstruction. The appendix is surgically absent. No colonic dilatation or wall thickening. Minimal rectal wall thickening. Vascular/Lymphatic: Atherosclerotic plaque within the normal caliber aorta. No suspicious or enlarged lymph nodes in the included  lymphatic chains. Reproductive: Uterus is surgically absent. No concerning adnexal lesions. Suspect retained ovarian tissue. Other: There is extensive soft tissue stranding and phlegmonous change in the superior gluteal cleft extending to the tip of the sacrum and towards the mesorectal fat within ill-defined possible fluid collection along the left lateral aspect of this inflammation measuring approximately 2 x 1 x 1 cm in size. Inflammatory change extends to the levator plate with some hazy intersphincteric stranding which could suggest some transsphincteric extension. No soft tissue gas is seen. No free fluid or air in the abdomen or pelvis. Peritoneal catheter terminates in the left lower quadrant. Musculoskeletal: No features of subjacent osteomyelitis of the sacrum or coccyx. No acute osseous abnormality or suspicious osseous lesion. Mild levocurvature of the spine is likely positional. IMPRESSION: 1. Extensive stranding and phlegmonous change in the superior gluteal cleft and ischioanal fossa extending to the tip of the sacrum and towards the levator plate within ill-defined possible fluid collection along the left lateral aspect of this measuring approximately 2 x 1 x 1 cm in size possibly reflecting developing abscess. Inflammatory change extends to the levator plate with some hazy intersphincteric stranding which could suggest some transsphincteric extension. No soft tissue gas is seen. No features of subjacent osteomyelitis of the sacrum or coccyx. 2. Increasing bilateral nonspecific perinephric stranding. Could be related to patient's decreased renal function though could consider urinalysis to exclude superimposed urinary tract infection. 3. Peritoneal catheter, likely peritoneal dialysis catheter terminating in the left lower quadrant. 4.  Aortic Atherosclerosis (ICD10-I70.0). Electronically Signed   By: Lovena Le M.D.   On: 12/25/2018 16:15     Gifford Shave, MD 12/27/2018, 7:27 AM PGY-1,  [facility] Intern pager: [phone], text pages welcome

## 2018-12-27 NOTE — Progress Notes (Signed)
[facility]/Trauma Progress Note      Assessment/Plan ESRD on PD CAD s/p mLAD PCI with DES - Plavix on hold PVD HTN DM type II  24F s/p mechanical GLF Sacral phlegmon- - CT w/ 2 x 1 x 1 cm phlegmon on CT scan that likely is an infected hematoma after GLF on 12/9.  - continue IV abx, WBC up to 38.9 and without abdominal pain or urinary symptoms this could be 2/2 infected hematoma, UA pending - will have MD evaluate for possible OR I&D - we will follow  FEN - Renal diet  VTE - Heparin  ID - Aztreonam/Flagyl/Vanc 12/14 >> WBC up to 38.9  Afebrile.  UA to r/o UTI given CT scan findings.    LOS: 1 day    Subjective: CC: buttock pain  Pt states pain is severe and worse than yesterday. She is not having abdominal pain.   Objective: Vital signs in last 24 hours: Temp:  [98.8 F (37.1 C)-99.8 F (37.7 C)] 98.9 F (37.2 C) (12/16 0710) Pulse Rate:  [96-108] 96 (12/16 0710) Resp:  [15-18] 17 (12/15 2101) BP: (118-154)/(62-74) 147/63 (12/16 0710) SpO2:  [96 %-100 %] 98 % (12/16 0710) Weight:  [73.2 kg] 73.2 kg (12/15 1715)    Intake/Output from previous day: 12/15 0701 - 12/16 0700 In: 810 [P.O.:510; IV Piggyback:300] Out: 301 [Emesis/NG output:301] Intake/Output this shift: No intake/output data recorded.  PE:  Gen:  Alert, NAD, pleasant, cooperative Pulm:  Rate and effort normal GU: Induration of R and L intergluteal cleft worse on the R. Severe TTP. No fluctuance noted. No drainage.  Skin: no rashes noted, warm and dry   Anti-infectives: Anti-infectives (From admission, onward)   Start     Dose/Rate Route Frequency Ordered Stop   12/26/18 0130  aztreonam (AZACTAM) 0.5 g in dextrose 5 % 50 mL IVPB     0.5 g 100 mL/hr over 30 Minutes Intravenous Every 8 hours 12/25/18 1858     12/26/18 0000  vancomycin variable dose per unstable renal function (pharmacist dosing)      Does not apply See admin instructions 12/25/18 1634     12/25/18 2200  aztreonam  (AZACTAM) 0.5 g in dextrose 5 % 50 mL IVPB  Status:  Discontinued     0.5 g 100 mL/hr over 30 Minutes Intravenous Every 12 hours 12/25/18 1634 12/25/18 1646   12/25/18 1900  metroNIDAZOLE (FLAGYL) IVPB 500 mg     500 mg 100 mL/hr over 60 Minutes Intravenous Every 8 hours 12/25/18 1858     12/25/18 1700  vancomycin (VANCOCIN) 2,000 mg in sodium chloride 0.9 % 500 mL IVPB  Status:  Discontinued     2,000 mg 250 mL/hr over 120 Minutes Intravenous  Once 12/25/18 1648 12/25/18 2030   12/25/18 1646  aztreonam (AZACTAM) 0.5 g in dextrose 5 % 50 mL IVPB  Status:  Discontinued     0.5 g 100 mL/hr over 30 Minutes Intravenous Every 8 hours 12/25/18 1646 12/25/18 1851   12/25/18 1645  vancomycin (VANCOCIN) IVPB 1000 mg/200 mL premix  Status:  Discontinued     1,000 mg 200 mL/hr over 60 Minutes Intravenous  Once 12/25/18 1631 12/25/18 1634   12/25/18 1645  aztreonam (AZACTAM) 2 g in sodium chloride 0.9 % 100 mL IVPB  Status:  Discontinued     2 g 200 mL/hr over 30 Minutes Intravenous  Once 12/25/18 1631 12/25/18 1634   12/25/18 1645  vancomycin (VANCOCIN) IVPB 1000 mg/200 mL premix  Status:  Discontinued     1,000 mg 200 mL/hr over 60 Minutes Intravenous  Once 12/25/18 1632 12/25/18 1634   12/25/18 1645  aztreonam (AZACTAM) 1 g in sodium chloride 0.9 % 100 mL IVPB  Status:  Discontinued     1 g 200 mL/hr over 30 Minutes Intravenous  Once 12/25/18 1632 12/25/18 1634   12/25/18 1645  vancomycin (VANCOCIN) 1,500 mg in sodium chloride 0.9 % 500 mL IVPB  Status:  Discontinued     1,500 mg 250 mL/hr over 120 Minutes Intravenous  Once 12/25/18 1634 12/25/18 1648      Lab Results:  Recent Labs    12/26/18 1047 12/27/18 0842  WBC 33.2* 38.9*  HGB 8.9* 8.7*  HCT 28.8* 28.1*  PLT 425* 432*   BMET Recent Labs    12/25/18 1115 12/26/18 1047  NA 138 134*  K 4.2 4.5  CL 99 101  CO2 22 18*  GLUCOSE 140* 217*  BUN 55* 59*  CREATININE 11.09* 11.09*  CALCIUM 8.8* 8.5*   PT/INR No results for  input(s): LABPROT, INR in the last 72 hours. CMP     Component Value Date/Time   NA 134 (L) 12/26/2018 1047   NA 132 (L) 07/26/2016 0000   K 4.5 12/26/2018 1047   CL 101 12/26/2018 1047   CO2 18 (L) 12/26/2018 1047   GLUCOSE 217 (H) 12/26/2018 1047   BUN 59 (H) 12/26/2018 1047   BUN 39 (H) 07/26/2016 0000   CREATININE 11.09 (H) 12/26/2018 1047   CALCIUM 8.5 (L) 12/26/2018 1047   CALCIUM 8.4 (L) 06/21/2017 1939   PROT 7.2 12/25/2018 1115   PROT 6.7 07/21/2017 0959   ALBUMIN 2.0 (L) 12/26/2018 1047   ALBUMIN 3.2 (L) 07/21/2017 0959   AST 14 (L) 12/25/2018 1115   ALT 15 12/25/2018 1115   ALKPHOS 122 12/25/2018 1115   BILITOT 0.8 12/25/2018 1115   BILITOT <0.2 07/21/2017 0959   GFRNONAA 4 (L) 12/26/2018 1047   GFRAA 4 (L) 12/26/2018 1047   Lipase     Component Value Date/Time   LIPASE 15 12/25/2018 1115    Studies/Results: CT Abdomen Pelvis Wo Contrast  Result Date: 12/25/2018 CLINICAL DATA:  Lower back pain radiating to buttocks, hard knot in the sacral region. EXAM: CT ABDOMEN AND PELVIS WITHOUT CONTRAST TECHNIQUE: Multidetector CT imaging of the abdomen and pelvis was performed following the standard protocol without IV contrast. Patient unable to tolerate supine scanning, images obtained in a decubitus positioning. COMPARISON:  CT abdomen pelvis 03/17/2017 FINDINGS: Lower chest: Lung bases are clear. Normal heart size. No pericardial effusion. Hepatobiliary: No focal liver abnormality is seen. Patient is post cholecystectomy. Slight prominence of the biliary tree likely related to reservoir effect. No calcified intraductal gallstones. Pancreas: Unremarkable. No pancreatic ductal dilatation or surrounding inflammatory changes. Spleen: Normal in size without focal abnormality. Adrenals/Urinary Tract: Normal adrenal glands. Increasing nonspecific bilateral perinephric stranding. No visible or concerning renal lesions. No urolithiasis or hydronephrosis. Urinary bladder is  unremarkable. Stomach/Bowel: Distal esophagus, stomach and duodenal sweep are unremarkable. No small bowel wall thickening or dilatation. No evidence of obstruction. The appendix is surgically absent. No colonic dilatation or wall thickening. Minimal rectal wall thickening. Vascular/Lymphatic: Atherosclerotic plaque within the normal caliber aorta. No suspicious or enlarged lymph nodes in the included lymphatic chains. Reproductive: Uterus is surgically absent. No concerning adnexal lesions. Suspect retained ovarian tissue. Other: There is extensive soft tissue stranding and phlegmonous change in the superior gluteal cleft extending to the tip of  the sacrum and towards the mesorectal fat within ill-defined possible fluid collection along the left lateral aspect of this inflammation measuring approximately 2 x 1 x 1 cm in size. Inflammatory change extends to the levator plate with some hazy intersphincteric stranding which could suggest some transsphincteric extension. No soft tissue gas is seen. No free fluid or air in the abdomen or pelvis. Peritoneal catheter terminates in the left lower quadrant. Musculoskeletal: No features of subjacent osteomyelitis of the sacrum or coccyx. No acute osseous abnormality or suspicious osseous lesion. Mild levocurvature of the spine is likely positional. IMPRESSION: 1. Extensive stranding and phlegmonous change in the superior gluteal cleft and ischioanal fossa extending to the tip of the sacrum and towards the levator plate within ill-defined possible fluid collection along the left lateral aspect of this measuring approximately 2 x 1 x 1 cm in size possibly reflecting developing abscess. Inflammatory change extends to the levator plate with some hazy intersphincteric stranding which could suggest some transsphincteric extension. No soft tissue gas is seen. No features of subjacent osteomyelitis of the sacrum or coccyx. 2. Increasing bilateral nonspecific perinephric stranding.  Could be related to patient's decreased renal function though could consider urinalysis to exclude superimposed urinary tract infection. 3. Peritoneal catheter, likely peritoneal dialysis catheter terminating in the left lower quadrant. 4.  Aortic Atherosclerosis (ICD10-I70.0). Electronically Signed   By: Lovena Le M.D.   On: 12/25/2018 16:15     Kalman Drape, [facility] Please see amion for pager for the following: Cristine Polio, & Friday 7:00am - 4:30pm Thursdays 7:00am -11:30am

## 2018-12-28 DIAGNOSIS — S300XXA Contusion of lower back and pelvis, initial encounter: Secondary | ICD-10-CM

## 2018-12-28 LAB — GLUCOSE, CAPILLARY
Glucose-Capillary: 156 mg/dL — ABNORMAL HIGH (ref 70–99)
Glucose-Capillary: 137 mg/dL — ABNORMAL HIGH (ref 70–99)
Glucose-Capillary: 144 mg/dL — ABNORMAL HIGH (ref 70–99)
Glucose-Capillary: 245 mg/dL — ABNORMAL HIGH (ref 70–99)

## 2018-12-28 LAB — CBC
HCT: 27.6 % — ABNORMAL LOW (ref 36.0–46.0)
Hemoglobin: 8.5 g/dL — ABNORMAL LOW (ref 12.0–15.0)
MCH: 26.2 pg (ref 26.0–34.0)
MCHC: 30.8 g/dL (ref 30.0–36.0)
MCV: 85.2 fL (ref 80.0–100.0)
Platelets: 459 10*3/uL — ABNORMAL HIGH (ref 150–400)
RBC: 3.24 MIL/uL — ABNORMAL LOW (ref 3.87–5.11)
RDW: 14.3 % (ref 11.5–15.5)
WBC: 39.3 10*3/uL — ABNORMAL HIGH (ref 4.0–10.5)
nRBC: 0 % (ref 0.0–0.2)

## 2018-12-28 LAB — BASIC METABOLIC PANEL
Anion gap: 13 (ref 5–15)
BUN: 47 mg/dL — ABNORMAL HIGH (ref 6–20)
CO2: 23 mmol/L (ref 22–32)
Calcium: 8.1 mg/dL — ABNORMAL LOW (ref 8.9–10.3)
Chloride: 96 mmol/L — ABNORMAL LOW (ref 98–111)
Creatinine, Ser: 8.65 mg/dL — ABNORMAL HIGH (ref 0.44–1.00)
GFR calc Af Amer: 6 mL/min — ABNORMAL LOW (ref >60)
GFR calc non Af Amer: 5 mL/min — ABNORMAL LOW (ref >60)
Glucose, Bld: 162 mg/dL — ABNORMAL HIGH (ref 70–99)
Potassium: 3.5 mmol/L (ref 3.5–5.1)
Sodium: 132 mmol/L — ABNORMAL LOW (ref 135–145)

## 2018-12-28 LAB — PATHOLOGIST SMEAR REVIEW

## 2018-12-28 MED ORDER — PENICILLIN G IN SODIUM CHLORIDE 10,000 UNITS/ML VIAL FOR SKIN TEST (NO CHARGE)
1000.0000 [IU] | Freq: Once | INTRADERMAL | Status: AC
  Administered 2018-12-28: 1000 [IU] via INTRADERMAL
  Filled 2018-12-28: qty 1

## 2018-12-28 MED ORDER — BENZYLPENICILLOYL POLYLYSINE 0.25 ML ID SOLN (NO-CHARGE)
0.1000 mL | Freq: Once | INTRADERMAL | Status: DC
  Filled 2018-12-28: qty 0.25

## 2018-12-28 MED ORDER — EPINEPHRINE 0.3 MG/0.3ML IJ SOAJ
0.3000 mg | Freq: Once | INTRAMUSCULAR | Status: DC | PRN
  Filled 2018-12-28 (×2): qty 0.6

## 2018-12-28 MED ORDER — BENZYLPENICILLOYL POLYLYSINE 0.25 ML ID SOLN
0.0600 mL | Freq: Once | INTRADERMAL | Status: AC
  Administered 2018-12-28: 0.06 mL via TOPICAL
  Filled 2018-12-28: qty 0.06

## 2018-12-28 MED ORDER — ASPIRIN EC 81 MG PO TBEC
81.0000 mg | DELAYED_RELEASE_TABLET | Freq: Every day | ORAL | Status: DC
  Administered 2018-12-28 – 2018-12-31 (×4): 81 mg via ORAL
  Filled 2018-12-28 (×5): qty 1

## 2018-12-28 MED ORDER — SODIUM CHLORIDE 0.9 % IV SOLN
1.5000 g | Freq: Two times a day (BID) | INTRAVENOUS | Status: DC
  Administered 2018-12-28 – 2018-12-29 (×2): 1.5 g via INTRAVENOUS
  Filled 2018-12-28 (×3): qty 4

## 2018-12-28 MED ORDER — EPINEPHRINE 0.3 MG/0.3ML IJ SOAJ
0.3000 mg | Freq: Once | INTRAMUSCULAR | Status: DC | PRN
  Filled 2018-12-28: qty 0.6

## 2018-12-28 MED ORDER — PENICILLIN G IN SODIUM CHLORIDE 10,000 UNITS/ML VIAL FOR SKIN TEST
1000.0000 [IU] | Freq: Once | INTRADERMAL | Status: DC
  Filled 2018-12-28: qty 1

## 2018-12-28 MED ORDER — SODIUM CHLORIDE 0.9% FLUSH
0.1000 mL | Freq: Once | INTRAVENOUS | Status: AC
  Administered 2018-12-28: 0.1 mL via TOPICAL

## 2018-12-28 MED ORDER — HISTAMINE PHOSPHATE 2.75 MG/ML IJ SOLN
0.1000 mL | Freq: Once | INTRAMUSCULAR | Status: DC
  Filled 2018-12-28: qty 5

## 2018-12-28 MED ORDER — BENZYLPENICILLOYL POLYLYSINE 0.25 ML ID SOLN (NO-CHARGE)
0.1000 mL | Freq: Once | INTRADERMAL | Status: AC
  Administered 2018-12-28: 0.1 mL via INTRADERMAL
  Filled 2018-12-28: qty 0.25

## 2018-12-28 MED ORDER — PENICILLIN G IN SODIUM CHLORIDE 10,000 UNITS/ML VIAL FOR SKIN TEST (NO CHARGE)
1000.0000 [IU] | Freq: Once | INTRADERMAL | Status: DC
  Filled 2018-12-28: qty 1

## 2018-12-28 MED ORDER — DIPHENHYDRAMINE HCL 50 MG/ML IJ SOLN: 25.0000 mg | Freq: Once | INTRAMUSCULAR | Status: DC | PRN

## 2018-12-28 MED ORDER — HYDROMORPHONE HCL 1 MG/ML IJ SOLN
1.0000 mg | INTRAMUSCULAR | Status: DC | PRN
  Administered 2018-12-28 – 2019-01-01 (×12): 1 mg via INTRAVENOUS
  Filled 2018-12-28 (×12): qty 1

## 2018-12-28 MED ORDER — SODIUM CHLORIDE 0.9% FLUSH
0.1000 mL | Freq: Once | INTRAVENOUS | Status: AC
  Administered 2018-12-28: 0.1 mL via INTRADERMAL

## 2018-12-28 MED ORDER — SODIUM CHLORIDE 0.9% FLUSH: 0.1000 mL | Freq: Once | INTRAVENOUS | Status: DC

## 2018-12-28 MED ORDER — AMOXICILLIN 500 MG PO CAPS
500.0000 mg | ORAL_CAPSULE | Freq: Once | ORAL | Status: AC
  Administered 2018-12-28: 500 mg via ORAL
  Filled 2018-12-28: qty 1

## 2018-12-28 MED ORDER — METOCLOPRAMIDE HCL 5 MG/ML IJ SOLN
5.0000 mg | Freq: Once | INTRAMUSCULAR | Status: AC
  Administered 2018-12-28: 5 mg via INTRAVENOUS
  Filled 2018-12-28: qty 2

## 2018-12-28 MED ORDER — PENICILLIN G IN SODIUM CHLORIDE 10,000 UNITS/ML VIAL FOR SKIN TEST
1000.0000 [IU] | Freq: Once | INTRADERMAL | Status: AC
  Administered 2018-12-28: 1000 [IU] via TOPICAL
  Filled 2018-12-28: qty 0.1

## 2018-12-28 MED ORDER — OXYCODONE HCL 5 MG PO TABS
5.0000 mg | ORAL_TABLET | ORAL | Status: DC | PRN
  Administered 2018-12-28: 5 mg via ORAL
  Filled 2018-12-28: qty 1

## 2018-12-28 MED ORDER — HISTAMINE PHOSPHATE 2.75 MG/ML IJ SOLN
0.1000 mL | Freq: Once | INTRAMUSCULAR | Status: AC
  Administered 2018-12-28: 0.1 mL via TOPICAL
  Filled 2018-12-28: qty 0.1

## 2018-12-28 MED ORDER — BENZYLPENICILLOYL POLYLYSINE 0.25 ML ID SOLN
0.0600 mL | Freq: Once | INTRADERMAL | Status: DC
  Filled 2018-12-28: qty 0.25

## 2018-12-28 MED ORDER — SENNOSIDES-DOCUSATE SODIUM 8.6-50 MG PO TABS
1.0000 | ORAL_TABLET | Freq: Every day | ORAL | Status: DC | PRN
  Administered 2018-12-28 – 2018-12-29 (×2): 1 via ORAL
  Filled 2018-12-28 (×2): qty 1

## 2018-12-28 NOTE — Progress Notes (Signed)
Consent for penicillin skin test

## 2018-12-28 NOTE — Progress Notes (Addendum)
Family Medicine Teaching Service Daily Progress Note Intern Pager: [phone]  Patient name: Susan Horton Medical record number: [id number] Date of birth: 1970/10/19 Age: 48 y.o. Gender: female  Primary Care Provider: Charlott Rakes, MD Consultants: Surgery Code Status: Code  Pt Overview and Major Events to Date:  12/25/2018-patient admitted to inpatient teaching service for sacral phlegmon  Assessment and Plan: RAYMA HEGG is a 48 y.o. female presenting with sacral phlegmon. PMH is significant for ESRD on home PD, T2DM, HTN, CHF, PVD.  Sacral phlegmon Patient fell approximately a week ago and has had pain in her sacral area since that time.  In the ED patient was afebrile, WBC-24.6, CT abdomen and pelvis showed extensive stranding and phlegmonous changes in the sacral area.  Patient was started on vancomycin, metronidazole, and aztreonam on 12/14.  Surgery consulted and is following.WBC 26.4> 33.2> 38.9.  Repeat CT on 12/16 showed continued stranding inflammation gluteal subcutaneous soft tissues with an enlarging fluid collection in the medial left buttocks subcutaneous soft tissue measuring 2.9 x 1.9 cm.  Surgery evaluated patient this morning and did bedside aspiration which produced old blood but no signs of infection in her sacral area. -Blood cultures no growth x2 days -Vancomycin (12/14-12/17) has been discontinued -Continue aztreonam and metronidazole per pharmacy recs   -General surgery consult, appreciate recommendations -Pain control with scheduled Tylenol -OxyIR or Dilaudid as needed -Tylenol every 6 hours scheduled -Vitals per routine -Restart home aspirin and Plavix   Constipation Patient reports that she has not had a bowel movement in over a week.  She says that this is because it hurts for her to strain to try and have a bowel movement.  She tried to push but nothing comes out.  Patient may go to surgery today and would rather deal with the constipation prior to her  procedure rather than after. -Fleet enema -Monitor bowel movement status  ESRD on home PD Patient gets nighttime home peritoneal dialysis.  Home medications include PhosLo, calcitriol.  Patient is scheduled for peritoneal dialysis nightly. Fluid was clear, given increasing white count while on appropriate antibiotics there is concern for SBP.  Culture and cell count were obtained.  No history of peritonitis.  Nephrology is following.  Increasing concern for SBP -Nephrology consulted, appreciate recommendations -Peritoneal fluid Gram stain showed rare WBCs present, predominantly PMN, no organisms seen -Peritoneal culture-no growth x1 day -Continue home peritoneal dialysis schedule -Continue home medications  CAD Follows with [facility]. Patient has history of CAD and is on aspirin, Plavix and metoprolol.  Patient had drug-eluting stent placed in 2018. -Restart home aspirin and Plavix -Continue home metoprolol  PAD Patient has history of significant peripheral artery disease.  She is status post right SFA intervention by Dr. Carlis Abbott on 12/14/2018.  Medications include Plavix, aspirin, lovastatin 20 mg daily -Holding home aspirin and Plavix -Switch to pravastatin 20 mg due to it being formulary in the hospital.  HFpEF Acute on chronic diastolic heart failure as listed in the patient's problem list and addressed in cardiology notes.  Patient recently had an echocardiogram as of 12/11 which showed an EF of 60 to 65% with normal left ventricular normal right ventricular function.  Echo from 2019 showed EF of 55 to 60% with G2DD.  Home medications as metoprolol XL -Monitor signs and symptoms of heart failure -Continue home metoprolol XL dose  T2 DM with peripheral neuropathy Patient's home medications include Toujeo 25 units twice daily and humulin SSI.  Hemoglobin A1c on  09/14/2017 was 8.3.  She also takes Lyrica for nerve pain.  Patient also takes amitriptyline 10 mg at bedtime.   Repeat hemoglobin A1c was 7.5 -Holding home Toujeo -Sensitive sliding scale -Continue home Lyrica and amitriptyline -CBGs with meals  Gastroparesis Patient has history of gastroparesis for which she takes metoclopramide 5 mg at home. -Continue home metoclopramide  HTN Blood pressures have ranged from 115/54-147/63. Patient's home medications include Toprol-XL 50 mg daily -Continue home medications  Asthma Home medications include Singulair PRN at bedtime, Advair twice daily and Ventolin as needed. -Continue home asthma medications -Holding Singulair at this time because it is as needed  FEN/GI: Renal/carb modified PPx: SCDs  Disposition: Pending further evaluation  Subjective:  Patient is lying on left side and complaining of pain.  She reported to me on evaluation that she was told by surgery that they wanted to do a bedside incision and drainage and she was not willing to do so.  Patient is requesting that she go to the OR if they are going to do an incision and drainage.  Patient is also asking for something to eat.  Per chart review after I left the patient ended up having a bedside aspiration.    Objective: Temp:  [97.6 F (36.4 C)-99.1 F (37.3 C)] 98.4 F (36.9 C) (12/17 0602) Pulse Rate:  [78-99] 78 (12/17 1311) Resp:  [16-19] 19 (12/17 1311) BP: (115-145)/(47-54) 120/52 (12/17 1311) SpO2:  [95 %-99 %] 96 % (12/17 1311) Weight:  [72.1 kg] 72.1 kg (12/16 1746) General: Patient is lying on her left side in the room.  She looks uncomfortable and is saying her back is hurting. HEENT: Atraumatic. Normocephalic.  Cardiac: RRR, no m/r/g Respiratory: CTAB, normal work of breathing Abdomen: Nontender.  She has bowel sounds.  Peritoneal dialysis catheter in place, there is no erythema or edema around insertion site. Skin: warm and dry, edema in sacral area which is warm to touch and feels more indurated than previous exam. Neuro: alert and oriented   Laboratory: Recent  Labs  Lab 12/26/18 1047 12/27/18 0842 12/28/18 0622  WBC 33.2* 38.9* 39.3*  HGB 8.9* 8.7* 8.5*  HCT 28.8* 28.1* 27.6*  PLT 425* 432* 459*   Recent Labs  Lab 12/25/18 1115 12/26/18 1047 12/27/18 0842 12/28/18 0622  NA 138 134* 135 132*  K 4.2 4.5 3.7 3.5  CL 99 101 99 96*  CO2 22 18* 22 23  BUN 55* 59* 55* 47*  CREATININE 11.09* 11.09* 10.20* 8.65*  CALCIUM 8.8* 8.5* 8.4* 8.1*  PROT 7.2  --   --   --   BILITOT 0.8  --   --   --   ALKPHOS 122  --   --   --   ALT 15  --   --   --   AST 14*  --   --   --   GLUCOSE 140* 217* 180* 162*   Lactic acid-1.6  Imaging/Diagnostic Tests: CT ABDOMEN PELVIS W CONTRAST  Result Date: 12/27/2018 CLINICAL DATA:  Anal/rectal abscess EXAM: CT ABDOMEN AND PELVIS WITH CONTRAST TECHNIQUE: Multidetector CT imaging of the abdomen and pelvis was performed using the standard protocol following bolus administration of intravenous contrast. CONTRAST:  174mL OMNIPAQUE IOHEXOL 300 MG/ML  SOLN COMPARISON:  12/25/2018 FINDINGS: Lower chest: No acute abnormality Hepatobiliary: No focal liver abnormality is seen. Status post cholecystectomy. No biliary dilatation. Pancreas: No focal abnormality or ductal dilatation. Spleen: No focal abnormality.  Normal size. Adrenals/Urinary Tract: No adrenal abnormality. No  focal renal abnormality. No stones or hydronephrosis. Urinary bladder is unremarkable. Stomach/Bowel: Stomach, large and small bowel grossly unremarkable. Vascular/Lymphatic: Aortoiliac atherosclerosis. No aneurysm or adenopathy. Reproductive: Prior hysterectomy.  No adnexal masses. Other: No free fluid or free air. Again noted is inflammation/stranding in the superior gluteal cleft. Enlarging subcutaneous fluid collection noted in the left medial buttock measuring 2.9 x 1.9 cm on image 92 of series 5. Musculoskeletal: No acute bony abnormality. IMPRESSION: Continued stranding/inflammation in the gluteal subcutaneous soft tissues at the superior gluteal cleft  with enlarging fluid collection in the medial left buttock subcutaneous soft tissues measuring 2.9 x 1.9 cm. Aortoiliac atherosclerosis. Prior cholecystectomy and hysterectomy. Electronically Signed   By: Rolm Baptise M.D.   On: 12/27/2018 17:47    Gifford Shave, MD 12/28/2018, 1:26 PM PGY-1, [facility] Intern pager: [phone], text pages welcome

## 2018-12-28 NOTE — Progress Notes (Signed)
Penicillin Skin Testing Assessment  Penicillin allergy skin testing completed on 12/28/2018  Penicillin allergy skin testing requested by: Pharmacy   Patient history of penicillin or beta lactam allergy: Britt's reaction to penicillin in her medical chart is listed as anaphylaxis, itching, swelling and rash. When I spoke with her about this reaction she said the last time she remembers reacting to penicillin was about 5 years ago and it caused her tongue to swell, her hands to itch and break out in hives. She has not taken any penicillin like antibiotics since then such as amoxicillin, Augmentin, or cephalexin. Given her risk for a severe reaction she is a penicillin skin testing candidate to evaluate her allergy.   Last use of antihistamine (or other drug affecting response to histamine): She is on no drugs affecting this response   Test Date Product Scratch Width (mm) Intradermal #1 Width (mm) Intradermal #2 Width (mm) Results (Pos/Neg/Ambiguous)  12/17 PRE-PEN (undiluted) 0 - 3 3 4 4  Neg -   Penicillin G (10,000 U/mL) 0 - 4 5 5 5  Neg -   Dilutent Control 0 - 3 3   - -   Histamine (1mg /mL) 4 -      - -    Criteria for positive prick skin test: Induration > 77mm greater than diluent control  Criteria for positive intradermal skin test: Significant increase in size of original bleb with wheal diameter 70mm or more larger than diluent control; itching and flare are commonly present Criteria for negative intradermal skin test: No increase in size of original bleb and no reaction greater than control site Equivocal intradermal skin test: Wheal only slightly larger than initial injection bleb and control site, with or without erythematous flare OR duplicates are discordant. Control site: If wheal >2-3 mm after 20 min, repeat skin test to look for dermatographism     Interpretation: [x]  NEGATIVE for penicillin allergy []  POSITIVE for penicillin allergy      The patient passed her  penicillin skin test with no issues. A one time dose of amoxicillin was ordered as the last step in this process before she can be de-labeled. She also tolerated this with no issues. Her allergy has been removed from her medical chart and her pharmacy and PCP have been contacted to update her allergies.   Nicoletta Dress, PharmD PGY2 Infectious Disease Pharmacy Resident

## 2018-12-28 NOTE — Progress Notes (Signed)
[facility] Progress Note   Dialysis Orders: CCPD - 1 2.5 5 L bag and 1 1.5 5 L bag - 5 fills - 2 L fills with 1 L in last fill dwell 1.5 hr   Assessment/Plan: 1. Gluteal abscess - WBC 25 > 33 K > 38.9 , surgery following.  On antibiotics per primary team. Labs pendign  2.  Gastroparesis - chronic problem -on chronic Reglan 5 mg  3. Peritonitis - cell count 333 - with 95% neutrophils - culture sent with no organisms on gram stain.  Vanc and aztreonam would cover empirically for peritonitis and she has no abdominal pain.  Favor gluteal abscess as etiology of leukocytosis.  Per radiology eval of 12/14 CT scan there is no communication of the abscess with the peritoneum  4. ESRD - on PD- continue 1.5% solution.  Has an AVF so could easily transition to HD if she had surgery for abscess that might warrant a holiday from PD. No communication with peritoneum on 12/14 scan per radiology. Obtain UA.    5. Anemia - hgb 10.3 -> 8.7  hold oral Fe to lessen pill burden with gastroparesis - start Aranesp 60 - has had in the past here - gets Mircera as outpatient (last dose in Sept) 6. Secondary hyperparathyroidism - Ca 8.4 on 3 Ca acetate ac - ws held to lessen pill burden and with nausea - resume at d/c - continue calcitriol  7. HTN/volume - BP variable- meds resumed but still vomiting some  Poor intake - no excess on exam.  8 . DM- per primary   Subjective:   CT with enlarging fluid collection medial left buttock.  Labs today aren't back yet.  She has continued to have considerable pain of her backside.    Review of systems:  Reports nausea with vomiting  Denies shortness of breath  No abdominal pain  No chest pain    Objective Vitals:   12/27/18 0710 12/27/18 1746 12/27/18 2131 12/28/18 0602  BP: (!) 147/63 (!) 121/47 (!) 145/51 (!) 115/54  Pulse: 96 86 99 95  Resp:  18 18 16   Temp: 98.9 F (37.2 C) 99.1 F (37.3 C) 97.6 F (36.4 C) 98.4 F (36.9 C)  TempSrc: Oral Oral  Oral Oral  SpO2: 98% 95% 98% 99%  Weight:  72.1 kg    Height:       Physical Exam General: adult female in bed uncomfortable from pain HEENT NCAT EOMI sclera anicteric Heart: RRR Lungs: clear to auscultation unlabored  Abdomen: soft she is nontender on my exam; PD cath intact  Buttocks: swelling and tenderness with displacement of gluteal clef - very tender with movement or  palpation Extremities: no edema Neuro alert and oriented x3; follows commands and provides hx Psych normal mood and affect Dialysis Access: left upper AVF + bruit and PD cath    Additional Objective Labs: Basic Metabolic Panel: Recent Labs  Lab 12/25/18 1115 12/26/18 1047 12/27/18 0842  NA 138 134* 135  K 4.2 4.5 3.7  CL 99 101 99  CO2 22 18* 22  GLUCOSE 140* 217* 180*  BUN 55* 59* 55*  CREATININE 11.09* 11.09* 10.20*  CALCIUM 8.8* 8.5* 8.4*  PHOS  --  5.7*  --    Liver Function Tests: Recent Labs  Lab 12/25/18 1115 12/26/18 1047  AST 14*  --   ALT 15  --   ALKPHOS 122  --   BILITOT 0.8  --   PROT 7.2  --  ALBUMIN 2.4* 2.0*   Recent Labs  Lab 12/25/18 1115  LIPASE 15   CBC: Recent Labs  Lab 12/25/18 1115 12/26/18 1047 12/27/18 0842  WBC 26.4* 33.2* 38.9*  NEUTROABS  --  30.2*  --   HGB 10.3* 8.9* 8.7*  HCT 33.6* 28.8* 28.1*  MCV 86.2 85.0 84.6  PLT 453* 425* 432*   Blood Culture    Component Value Date/Time   SDES PERITONEAL DIALYSIS 12/26/2018 1503   SPECREQUEST NONE 12/26/2018 1503   CULT PENDING 12/26/2018 1503   REPTSTATUS PENDING 12/26/2018 1503    Cardiac Enzymes: No results for input(s): CKTOTAL, CKMB, CKMBINDEX, TROPONINI in the last 168 hours. CBG: Recent Labs  Lab 12/26/18 2201 12/27/18 0808 12/27/18 1158 12/27/18 1813 12/27/18 2133  GLUCAP 205* 155* 140* 128* 242*   Iron Studies: No results for input(s): IRON, TIBC, TRANSFERRIN, FERRITIN in the last 72 hours. Lab Results  Component Value Date   INR 0.91 02/01/2017   INR 0.9 07/26/2016   INR  1.07 10/23/2014   Studies/Results: CT ABDOMEN PELVIS W CONTRAST  Result Date: 12/27/2018 CLINICAL DATA:  Anal/rectal abscess EXAM: CT ABDOMEN AND PELVIS WITH CONTRAST TECHNIQUE: Multidetector CT imaging of the abdomen and pelvis was performed using the standard protocol following bolus administration of intravenous contrast. CONTRAST:  141mL OMNIPAQUE IOHEXOL 300 MG/ML  SOLN COMPARISON:  12/25/2018 FINDINGS: Lower chest: No acute abnormality Hepatobiliary: No focal liver abnormality is seen. Status post cholecystectomy. No biliary dilatation. Pancreas: No focal abnormality or ductal dilatation. Spleen: No focal abnormality.  Normal size. Adrenals/Urinary Tract: No adrenal abnormality. No focal renal abnormality. No stones or hydronephrosis. Urinary bladder is unremarkable. Stomach/Bowel: Stomach, large and small bowel grossly unremarkable. Vascular/Lymphatic: Aortoiliac atherosclerosis. No aneurysm or adenopathy. Reproductive: Prior hysterectomy.  No adnexal masses. Other: No free fluid or free air. Again noted is inflammation/stranding in the superior gluteal cleft. Enlarging subcutaneous fluid collection noted in the left medial buttock measuring 2.9 x 1.9 cm on image 92 of series 5. Musculoskeletal: No acute bony abnormality. IMPRESSION: Continued stranding/inflammation in the gluteal subcutaneous soft tissues at the superior gluteal cleft with enlarging fluid collection in the medial left buttock subcutaneous soft tissues measuring 2.9 x 1.9 cm. Aortoiliac atherosclerosis. Prior cholecystectomy and hysterectomy. Electronically Signed   By: Rolm Baptise M.D.   On: 12/27/2018 17:47   Medications: . aztreonam 0.5 g (12/28/18 0106)  . dialysis solution 1.5% low-MG/low-CA    . metronidazole 500 mg (12/28/18 0525)   . acetaminophen  650 mg Oral Q6H  . amitriptyline  10 mg Oral QHS  . calcitRIOL  1.5 mcg Oral Daily  . darbepoetin (ARANESP) injection - DIALYSIS  60 mcg Subcutaneous Q Wed-1800  .  gentamicin cream  1 application Topical Daily  . heparin  5,000 Units Subcutaneous Q8H  . insulin aspart  0-6 Units Subcutaneous TID WC  . metoCLOPramide  5 mg Oral TID AC & HS  . metoprolol succinate  50 mg Oral Daily  . mometasone-formoterol  2 puff Inhalation BID  . oxyCODONE  5 mg Oral Q6H  . pantoprazole  40 mg Oral Daily  . pravastatin  20 mg Oral q1800  . pregabalin  75 mg Oral Daily  . vancomycin variable dose per unstable renal function (pharmacist dosing)   Does not apply See admin instructions     Claudia Desanctis 12/28/2018 6:52 AM

## 2018-12-28 NOTE — Plan of Care (Signed)
  Problem: Education: Goal: Knowledge of General Education information will improve Description: Including pain rating scale, medication(s)/side effects and non-pharmacologic comfort measures Outcome: Progressing   Problem: Health Behavior/Discharge Planning: Goal: Ability to manage health-related needs will improve Outcome: Progressing   Problem: Clinical Measurements: Goal: Ability to maintain clinical measurements within normal limits will improve Outcome: Progressing Goal: Will remain free from infection Outcome: Progressing   Problem: Pain Managment: Goal: General experience of comfort will improve Outcome: Progressing   Problem: Safety: Goal: Ability to remain free from injury will improve Outcome: Progressing   Problem: Skin Integrity: Goal: Risk for impaired skin integrity will decrease Outcome: Progressing

## 2018-12-28 NOTE — Progress Notes (Signed)
Pharmacy Antibiotic Note  Susan Horton is a 48 y.o. female admitted on 12/25/2018 with developing gluteal abscess.  Pharmacy has been consulted for aztreonam/vancomycin/ metronidazole dosing. "Anaphylaxis, itching, swelling, rash" allergy reported to PCN, no beta lactam use history found in chart. Patient is ESRD on PD daily.   Patient is refusing bedside I&D, no cultures from abscess available. Continue broad spectrum antibiotics.   Plan: Vancomcyin 2g IV x 1 given 12/14 12/18 Random vancomycin level ordered  Dose vancomycin per level given on PD  Aztreonam 500mg  IV q8h (PD dosing - 25% of usual dosage at the usual interval) Metronidazole 500mg  Q8 hr  Monitor clinical progress, c/s, abx plan/LOT   Height: 5\' 5"  (165.1 cm) Weight: 158 lb 15.2 oz (72.1 kg) IBW/kg (Calculated) : 57  Temp (24hrs), Avg:98.4 F (36.9 C), Min:97.6 F (36.4 C), Max:99.1 F (37.3 C)  Recent Labs  Lab 12/25/18 1115 12/25/18 1300 12/26/18 1047 12/27/18 0842 12/28/18 0622  WBC 26.4*  --  33.2* 38.9* 39.3*  CREATININE 11.09*  --  11.09* 10.20* 8.65*  LATICACIDVEN  --  1.6  --   --   --     Estimated Creatinine Clearance: 7.9 mL/min (A) (by C-G formula based on SCr of 8.65 mg/dL (H)).    Allergies  Allergen Reactions  . Penicillins Anaphylaxis, Itching, Swelling, Rash and Other (See Comments)    Swelling of throat & whole mouth  Has patient had a PCN reaction causing immediate rash, facial/tongue/throat swelling, SOB or lightheadedness with hypotension: Yes Has patient had a PCN reaction causing severe rash involving mucus membranes or skin necrosis: No Has patient had a PCN reaction that required hospitalization: No Has patient had a PCN reaction occurring within the last 10 years: No If all of the above answers are "NO", then may proceed with Cephalosporin use.  . Repatha [Evolocumab] Itching  . Lisinopril Cough   Antimicrobials this admission: Vancomycin 12/14 >>  Aztreonam 12/14 >>   Metronidazole 12/14 >>   Microbiology results: 12/14 Covid neg 12/14 BCx: NGTD 12/15 Peritoneal: NGTD, rare WBC   Benetta Spar, PharmD, BCPS, [place] Clinical Pharmacist  Please check AMION for all [facility] phone numbers After 10:00 PM, call [place] [phone]

## 2018-12-28 NOTE — Progress Notes (Addendum)
Subjective: CC: Buttock pain Patient reports ongoing back of her buttocks that is worse on the left. No abdominal pain.   Objective: Vital signs in last 24 hours: Temp:  [97.6 F (36.4 C)-99.1 F (37.3 C)] 98.4 F (36.9 C) (12/17 0602) Pulse Rate:  [86-99] 95 (12/17 0602) Resp:  [16-18] 16 (12/17 0602) BP: (115-145)/(47-54) 115/54 (12/17 0602) SpO2:  [95 %-99 %] 98 % (12/17 0744) Weight:  [72.1 kg] 72.1 kg (12/16 1746) Last BM Date: (PTA)  Intake/Output from previous day: 12/16 0701 - 12/17 0700 In: -  Out: 100 [Urine:100] Intake/Output this shift: No intake/output data recorded.  PE: Gen:  Alert, NAD, frustrated Pulm:  Rate and effort normal GU: Induration of R and L intergluteal cleft worse on the L. TTP. No fluctuance noted. No drainage.  Skin: no rashes noted, warm and dry  Lab Results:  Recent Labs    12/27/18 0842 12/28/18 0622  WBC 38.9* 39.3*  HGB 8.7* 8.5*  HCT 28.1* 27.6*  PLT 432* 459*   BMET Recent Labs    12/27/18 0842 12/28/18 0622  NA 135 132*  K 3.7 3.5  CL 99 96*  CO2 22 23  GLUCOSE 180* 162*  BUN 55* 47*  CREATININE 10.20* 8.65*  CALCIUM 8.4* 8.1*   PT/INR No results for input(s): LABPROT, INR in the last 72 hours. CMP     Component Value Date/Time   NA 132 (L) 12/28/2018 0622   NA 132 (L) 07/26/2016 0000   K 3.5 12/28/2018 0622   CL 96 (L) 12/28/2018 0622   CO2 23 12/28/2018 0622   GLUCOSE 162 (H) 12/28/2018 0622   BUN 47 (H) 12/28/2018 0622   BUN 39 (H) 07/26/2016 0000   CREATININE 8.65 (H) 12/28/2018 0622   CALCIUM 8.1 (L) 12/28/2018 0622   CALCIUM 8.4 (L) 06/21/2017 1939   PROT 7.2 12/25/2018 1115   PROT 6.7 07/21/2017 0959   ALBUMIN 2.0 (L) 12/26/2018 1047   ALBUMIN 3.2 (L) 07/21/2017 0959   AST 14 (L) 12/25/2018 1115   ALT 15 12/25/2018 1115   ALKPHOS 122 12/25/2018 1115   BILITOT 0.8 12/25/2018 1115   BILITOT <0.2 07/21/2017 0959   GFRNONAA 5 (L) 12/28/2018 0622   GFRAA 6 (L) 12/28/2018 0622   Lipase       Component Value Date/Time   LIPASE 15 12/25/2018 1115       Studies/Results: CT ABDOMEN PELVIS W CONTRAST  Result Date: 12/27/2018 CLINICAL DATA:  Anal/rectal abscess EXAM: CT ABDOMEN AND PELVIS WITH CONTRAST TECHNIQUE: Multidetector CT imaging of the abdomen and pelvis was performed using the standard protocol following bolus administration of intravenous contrast. CONTRAST:  167mL OMNIPAQUE IOHEXOL 300 MG/ML  SOLN COMPARISON:  12/25/2018 FINDINGS: Lower chest: No acute abnormality Hepatobiliary: No focal liver abnormality is seen. Status post cholecystectomy. No biliary dilatation. Pancreas: No focal abnormality or ductal dilatation. Spleen: No focal abnormality.  Normal size. Adrenals/Urinary Tract: No adrenal abnormality. No focal renal abnormality. No stones or hydronephrosis. Urinary bladder is unremarkable. Stomach/Bowel: Stomach, large and small bowel grossly unremarkable. Vascular/Lymphatic: Aortoiliac atherosclerosis. No aneurysm or adenopathy. Reproductive: Prior hysterectomy.  No adnexal masses. Other: No free fluid or free air. Again noted is inflammation/stranding in the superior gluteal cleft. Enlarging subcutaneous fluid collection noted in the left medial buttock measuring 2.9 x 1.9 cm on image 92 of series 5. Musculoskeletal: No acute bony abnormality. IMPRESSION: Continued stranding/inflammation in the gluteal subcutaneous soft tissues at the superior gluteal cleft with enlarging  fluid collection in the medial left buttock subcutaneous soft tissues measuring 2.9 x 1.9 cm. Aortoiliac atherosclerosis. Prior cholecystectomy and hysterectomy. Electronically Signed   By: Rolm Baptise M.D.   On: 12/27/2018 17:47    Anti-infectives: Anti-infectives (From admission, onward)   Start     Dose/Rate Route Frequency Ordered Stop   12/26/18 0130  aztreonam (AZACTAM) 0.5 g in dextrose 5 % 50 mL IVPB     0.5 g 100 mL/hr over 30 Minutes Intravenous Every 8 hours 12/25/18 1858     12/26/18  0000  vancomycin variable dose per unstable renal function (pharmacist dosing)      Does not apply See admin instructions 12/25/18 1634     12/25/18 2200  aztreonam (AZACTAM) 0.5 g in dextrose 5 % 50 mL IVPB  Status:  Discontinued     0.5 g 100 mL/hr over 30 Minutes Intravenous Every 12 hours 12/25/18 1634 12/25/18 1646   12/25/18 1900  metroNIDAZOLE (FLAGYL) IVPB 500 mg     500 mg 100 mL/hr over 60 Minutes Intravenous Every 8 hours 12/25/18 1858     12/25/18 1700  vancomycin (VANCOCIN) 2,000 mg in sodium chloride 0.9 % 500 mL IVPB  Status:  Discontinued     2,000 mg 250 mL/hr over 120 Minutes Intravenous  Once 12/25/18 1648 12/25/18 2030   12/25/18 1646  aztreonam (AZACTAM) 0.5 g in dextrose 5 % 50 mL IVPB  Status:  Discontinued     0.5 g 100 mL/hr over 30 Minutes Intravenous Every 8 hours 12/25/18 1646 12/25/18 1851   12/25/18 1645  vancomycin (VANCOCIN) IVPB 1000 mg/200 mL premix  Status:  Discontinued     1,000 mg 200 mL/hr over 60 Minutes Intravenous  Once 12/25/18 1631 12/25/18 1634   12/25/18 1645  aztreonam (AZACTAM) 2 g in sodium chloride 0.9 % 100 mL IVPB  Status:  Discontinued     2 g 200 mL/hr over 30 Minutes Intravenous  Once 12/25/18 1631 12/25/18 1634   12/25/18 1645  vancomycin (VANCOCIN) IVPB 1000 mg/200 mL premix  Status:  Discontinued     1,000 mg 200 mL/hr over 60 Minutes Intravenous  Once 12/25/18 1632 12/25/18 1634   12/25/18 1645  aztreonam (AZACTAM) 1 g in sodium chloride 0.9 % 100 mL IVPB  Status:  Discontinued     1 g 200 mL/hr over 30 Minutes Intravenous  Once 12/25/18 1632 12/25/18 1634   12/25/18 1645  vancomycin (VANCOCIN) 1,500 mg in sodium chloride 0.9 % 500 mL IVPB  Status:  Discontinued     1,500 mg 250 mL/hr over 120 Minutes Intravenous  Once 12/25/18 1634 12/25/18 1648       Assessment/Plan ESRD on PD CAD s/p mLAD PCI with DES - Plavix on hold PVD HTN DM type II  34F s/p mechanical GLF Sacral phlegmon- - CT 12/14 w/2 x 1 x 1 cmphlegmon  on CT scan that likely is an infected hematoma after GLF on 12/9.  - CT 12/16 w/ enlarging fluid collection in the medial left buttock subcutaneous soft tissues measuring 2.9 x 1.9 cm - Continue IV abx. We have recommended and offered bedside aspiration, patient declined, stating that she would rather leave than have a bedside procedure. We discussed risks, benefits and what the procedure would entail. I will check back with patient in PM to see if she has changed her mind about letting us perform procedure at bedside.  - WBC up to 39.3. Leukocytosis is worsening despite broad-spectrum antibiotics. Suspicion is low for  this sacral fluid collection being the only cause for this. We have offered bedside aspiration of this but patient has declined at this time. Please see above for more details. She still makes urine and on her prior scan, she had some peri-nephric stranding. With this, we recommend a UA to r/o UTI. She also had peritoneal sampling previously that showed an elevated neutrophil count and in the setting of undergoing peritoneal dialysis, her risk of having infectious peritonitis is increased. Fluids have been sent for gram stain and culture by nephrology. - We will follow  FEN -Renal diet VTE -Heparin  ID -Aztreonam/Flagyl/Vanc 12/14 >>   LOS: 2 days    Jillyn Ledger , [facility] 12/28/2018, 9:19 AM Please see Amion for pager number during day hours 7:00am-4:30pm

## 2018-12-29 DIAGNOSIS — Z992 Dependence on renal dialysis: Secondary | ICD-10-CM

## 2018-12-29 DIAGNOSIS — D72829 Elevated white blood cell count, unspecified: Secondary | ICD-10-CM

## 2018-12-29 DIAGNOSIS — Z888 Allergy status to other drugs, medicaments and biological substances status: Secondary | ICD-10-CM

## 2018-12-29 DIAGNOSIS — I120 Hypertensive chronic kidney disease with stage 5 chronic kidney disease or end stage renal disease: Secondary | ICD-10-CM

## 2018-12-29 DIAGNOSIS — L0231 Cutaneous abscess of buttock: Principal | ICD-10-CM

## 2018-12-29 DIAGNOSIS — Z87891 Personal history of nicotine dependence: Secondary | ICD-10-CM

## 2018-12-29 DIAGNOSIS — N186 End stage renal disease: Secondary | ICD-10-CM

## 2018-12-29 DIAGNOSIS — E1122 Type 2 diabetes mellitus with diabetic chronic kidney disease: Secondary | ICD-10-CM

## 2018-12-29 LAB — BASIC METABOLIC PANEL
Anion gap: 14 (ref 5–15)
BUN: 46 mg/dL — ABNORMAL HIGH (ref 6–20)
CO2: 24 mmol/L (ref 22–32)
Calcium: 8.2 mg/dL — ABNORMAL LOW (ref 8.9–10.3)
Chloride: 94 mmol/L — ABNORMAL LOW (ref 98–111)
Creatinine, Ser: 8.66 mg/dL — ABNORMAL HIGH (ref 0.44–1.00)
GFR calc Af Amer: 6 mL/min — ABNORMAL LOW (ref >60)
GFR calc non Af Amer: 5 mL/min — ABNORMAL LOW (ref >60)
Glucose, Bld: 204 mg/dL — ABNORMAL HIGH (ref 70–99)
Potassium: 3.7 mmol/L (ref 3.5–5.1)
Sodium: 132 mmol/L — ABNORMAL LOW (ref 135–145)

## 2018-12-29 LAB — CBC WITH DIFFERENTIAL/PLATELET
Abs Immature Granulocytes: 1.15 10*3/uL — ABNORMAL HIGH (ref 0.00–0.07)
Basophils Absolute: 0.1 10*3/uL (ref 0.0–0.1)
Basophils Relative: 0 %
Eosinophils Absolute: 0.2 10*3/uL (ref 0.0–0.5)
Eosinophils Relative: 0 %
HCT: 29.9 % — ABNORMAL LOW (ref 36.0–46.0)
Hemoglobin: 9.2 g/dL — ABNORMAL LOW (ref 12.0–15.0)
Immature Granulocytes: 3 %
Lymphocytes Relative: 5 %
Lymphs Abs: 2.1 10*3/uL (ref 0.7–4.0)
MCH: 26.5 pg (ref 26.0–34.0)
MCHC: 30.8 g/dL (ref 30.0–36.0)
MCV: 86.2 fL (ref 80.0–100.0)
Monocytes Absolute: 1.9 10*3/uL — ABNORMAL HIGH (ref 0.1–1.0)
Monocytes Relative: 5 %
Neutro Abs: 36.3 10*3/uL — ABNORMAL HIGH (ref 1.7–7.7)
Neutrophils Relative %: 87 %
Platelets: 490 10*3/uL — ABNORMAL HIGH (ref 150–400)
RBC: 3.47 MIL/uL — ABNORMAL LOW (ref 3.87–5.11)
RDW: 14.6 % (ref 11.5–15.5)
WBC: 41.6 10*3/uL — ABNORMAL HIGH (ref 4.0–10.5)
nRBC: 0 % (ref 0.0–0.2)

## 2018-12-29 LAB — CBC
HCT: 30 % — ABNORMAL LOW (ref 36.0–46.0)
Hemoglobin: 9.2 g/dL — ABNORMAL LOW (ref 12.0–15.0)
MCH: 26.4 pg (ref 26.0–34.0)
MCHC: 30.7 g/dL (ref 30.0–36.0)
MCV: 86.2 fL (ref 80.0–100.0)
Platelets: 499 10*3/uL — ABNORMAL HIGH (ref 150–400)
RBC: 3.48 MIL/uL — ABNORMAL LOW (ref 3.87–5.11)
RDW: 14.6 % (ref 11.5–15.5)
WBC: 42.4 10*3/uL — ABNORMAL HIGH (ref 4.0–10.5)
nRBC: 0 % (ref 0.0–0.2)

## 2018-12-29 LAB — GLUCOSE, CAPILLARY
Glucose-Capillary: 180 mg/dL — ABNORMAL HIGH (ref 70–99)
Glucose-Capillary: 230 mg/dL — ABNORMAL HIGH (ref 70–99)
Glucose-Capillary: 186 mg/dL — ABNORMAL HIGH (ref 70–99)
Glucose-Capillary: 124 mg/dL — ABNORMAL HIGH (ref 70–99)

## 2018-12-29 MED ORDER — LINEZOLID 600 MG/300ML IV SOLN
600.0000 mg | Freq: Two times a day (BID) | INTRAVENOUS | Status: DC
  Administered 2018-12-29 – 2019-01-02 (×9): 600 mg via INTRAVENOUS
  Filled 2018-12-29 (×11): qty 300

## 2018-12-29 MED ORDER — ALTEPLASE 2 MG IJ SOLR
INTRAMUSCULAR | Status: AC
  Administered 2018-12-29: 4 mg
  Filled 2018-12-29: qty 4

## 2018-12-29 MED ORDER — INSULIN GLARGINE 100 UNIT/ML ~~LOC~~ SOLN
10.0000 [IU] | Freq: Every day | SUBCUTANEOUS | Status: DC
  Administered 2018-12-29 – 2019-01-01 (×4): 10 [IU] via SUBCUTANEOUS
  Filled 2018-12-29 (×5): qty 0.1

## 2018-12-29 NOTE — Consult Note (Signed)
Date of Admission:  12/25/2018          Reason for Consult: Leukocytosis   Referring Provider: Dr. Owens Shark   Assessment:  1. Gluteal abscess  2. Possible peritonitis (she has it by labs but not many symptoms) 3. ESRD on PD 4. NOT allergic to PCN  Plan:  1. Agree that MRI buttocks reasonable 2. Start zyvox to cover for MRSA and DC augmentin 3. If still with leukocytosis and work-up for deeper infection in the buttocks is negative would consider removing peritoneal dialysis catheter\ 4. Would not analyze urine or urine cultures in a patient that is having absolutely no symptoms concerning for urinary tract infection and who is a peritoneal dialysis patient who is undoubtedly going to have pulled urine with bacteria in it  Dr. Megan Salon will check on the patient this weekend  Active Problems:   Gluteal abscess   Scheduled Meds: . acetaminophen  650 mg Oral Q6H  . amitriptyline  10 mg Oral QHS  . aspirin EC  81 mg Oral Daily  . calcitRIOL  1.5 mcg Oral Daily  . darbepoetin (ARANESP) injection - DIALYSIS  60 mcg Subcutaneous Q Wed-1800  . gentamicin cream  1 application Topical Daily  . heparin  5,000 Units Subcutaneous Q8H  . insulin aspart  0-6 Units Subcutaneous TID WC  . metoCLOPramide  5 mg Oral TID AC & HS  . metoprolol succinate  50 mg Oral Daily  . mometasone-formoterol  2 puff Inhalation BID  . oxyCODONE  5 mg Oral Q6H  . pantoprazole  40 mg Oral Daily  . pravastatin  20 mg Oral q1800  . pregabalin  75 mg Oral Daily   Continuous Infusions: . dialysis solution 1.5% low-MG/low-CA    . linezolid (ZYVOX) IV    . metronidazole 500 mg (12/29/18 1042)   PRN Meds:.albuterol, EPINEPHrine, dianeal solution for CAPD/CCPD with heparin, oxyCODONE **OR** HYDROmorphone (DILAUDID) injection, iohexol, senna-docusate  HPI: Susan Horton is a 48 y.o. female with diabetes mellitus, hypertension end-stage renal disease on peritoneal dialysis had apparently fallen and injured her  left buttocks.  She was seen in the ER and then sent home and then returned worsening pain and redness in the area.  She found to have leukocytosis.  CT scan of the abdomen pelvis showed evidence of a phlegmon versus hematoma.  Patient was initiated on vancomycin and aztreonam along with metronidazole on the 14th the fluid was aspirated on the 15th though I do not see that it was sent for culture at that time.  Her blood cultures were drawn on the 14th.  On the 15th she had fluid taken from her peritoneal dialysis catheter and this was sent for culture which did not grow anything on the 16th of peritoneal dialysis fluid was analyzed and found to have a cell count of 333 blood cells with 95% neutrophils.  Patient is been change around discharge antibiotics and now is on Unasyn and metronidazole.  General surgery been consulted I do not feel that her leukocytosis and clinical presentation is due to a buttocks abscess but are concerned that her peritoneum may be infected.  In talking the patient her main complaint today is of the pain in her buttocks that was alleviated by intravenous opiates.  She denies any abdominal pain.  Based on her clinical presentation I would think that the buttocks is the area and source of her infection and I would target that.  It was been helpful if the bloody  fluid was sent for culture but it was not.  1 could get an MRI to see if there is evidence of pyomyositis.  She is also morbidly obese and sometimes fluid collections can be more subtle to discover.  Her peritoneal fluid analysis is actually consistent with infection in that area.  The sterility the cultures are hard to interpret given the fact she was given antecedent antibiotics.  I am going to change her to Zyvox to cover for methicillin-resistant Staph aureus.  If imaging fails to disclose further evidence of significant abscess in the buttocks region and she does not respond to Zyvox, I would remove her  peritoneal dialysis catheter.  Dr. Megan Salon to see the patient over the weekend.   Review of Systems: Review of Systems  Constitutional: Negative for chills, diaphoresis, fever, malaise/fatigue and weight loss.  HENT: Negative for congestion, hearing loss, sore throat and tinnitus.   Eyes: Negative for blurred vision and double vision.  Respiratory: Negative for cough, sputum production, shortness of breath and wheezing.   Cardiovascular: Negative for chest pain, palpitations and leg swelling.  Gastrointestinal: Negative for abdominal pain, blood in stool, constipation, diarrhea, heartburn, melena, nausea and vomiting.  Genitourinary: Negative for dysuria, flank pain, frequency and hematuria.  Musculoskeletal: Positive for myalgias. Negative for back pain, falls and joint pain.  Skin: Negative for itching and rash.  Neurological: Negative for dizziness, sensory change, focal weakness, loss of consciousness, weakness and headaches.  Endo/Heme/Allergies: Does not bruise/bleed easily.  Psychiatric/Behavioral: Negative for depression, memory loss and suicidal ideas. The patient is not nervous/anxious.     Past Medical History:  Diagnosis Date  . Anemia   . Angio-edema   . Asthma   . CAD (coronary artery disease)    DES to mid LAD July 2018, residual moderate RCA disease  . Cataract   . CKD (chronic kidney disease) stage 4, GFR 15-29 ml/min (HCC)    Dialysis T/Th/Sa  . DVT (deep venous thrombosis) ([place])    1996 during pregnancy, 2015 left leg  . Eczema   . Essential hypertension 12/11/2015  . Gastroparesis   . GERD (gastroesophageal reflux disease)   . Gluteal abscess 12/27/2018  . Hidradenitis   . Migraine   . Neuropathy   . Peripheral vascular disease ([place])    blood clot in leg  . Type 2 diabetes mellitus ([place])   . Type II diabetes mellitus ([place])   . Urticaria     Social History   Tobacco Use  . Smoking status: Former Smoker    Years: 20.00    Types: Cigarettes     Quit date: 11/2016    Years since quitting: 2.1  . Smokeless tobacco: Never Used  Substance Use Topics  . Alcohol use: No    Alcohol/week: 0.0 standard drinks  . Drug use: No    Family History  Problem Relation Age of Onset  . Allergic rhinitis Mother   . Hypertension Father   . Allergic rhinitis Father   . Hypertension Sister   . Allergic rhinitis Sister   . Hypertension Brother   . Allergic rhinitis Brother   . Breast cancer Cousin        x2   Allergies  Allergen Reactions  . Repatha [Evolocumab] Itching  . Lisinopril Cough    OBJECTIVE: Blood pressure (!) 125/54, pulse (!) 102, temperature 98.3 F (36.8 C), temperature source Oral, resp. rate 18, height 5\' 5"  (1.651 m), weight 72.1 kg, SpO2 98 %.  Physical Exam Vitals reviewed. Exam  conducted with a chaperone present.  Constitutional:      General: She is not in acute distress.    Appearance: Normal appearance. She is well-developed. She is not ill-appearing or diaphoretic.  HENT:     Head: Normocephalic and atraumatic.     Right Ear: Hearing and external ear normal.     Left Ear: Hearing and external ear normal.     Nose: No nasal deformity or rhinorrhea.  Eyes:     General: No scleral icterus.    Conjunctiva/sclera: Conjunctivae normal.     Right eye: Right conjunctiva is not injected.     Left eye: Left conjunctiva is not injected.     Pupils: Pupils are equal, round, and reactive to light.  Neck:     Vascular: No JVD.  Cardiovascular:     Rate and Rhythm: Normal rate and regular rhythm.     Heart sounds: Normal heart sounds, S1 normal and S2 normal. No murmur. No friction rub.  Pulmonary:     Effort: Pulmonary effort is normal. No respiratory distress.     Breath sounds: Normal breath sounds. No stridor. No rhonchi or rales.  Abdominal:     General: Bowel sounds are normal. There is distension.     Palpations: Abdomen is soft.     Tenderness: There is no abdominal tenderness.  Musculoskeletal:         General: Normal range of motion.     Right shoulder: Normal.     Left shoulder: Normal.     Cervical back: Normal range of motion and neck supple.     Right hip: Normal.     Left hip: Normal.     Right knee: Normal.     Left knee: Normal.  Lymphadenopathy:     Head:     Right side of head: No submandibular, preauricular or posterior auricular adenopathy.     Left side of head: No submandibular, preauricular or posterior auricular adenopathy.     Cervical: No cervical adenopathy.     Right cervical: No superficial or deep cervical adenopathy.    Left cervical: No superficial or deep cervical adenopathy.  Skin:    General: Skin is warm and dry.     Coloration: Skin is not pale.     Findings: No abrasion, bruising, ecchymosis, erythema, lesion or rash.     Nails: There is no clubbing.  Neurological:     General: No focal deficit present.     Mental Status: She is alert and oriented to person, place, and time.     Sensory: No sensory deficit.     Coordination: Coordination normal.     Gait: Gait normal.  Psychiatric:        Attention and Perception: She is attentive.        Mood and Affect: Mood normal.        Speech: Speech normal.        Behavior: Behavior normal. Behavior is cooperative.        Thought Content: Thought content normal.        Judgment: Judgment normal.   Buttocks with duration bilateral around the gluteal cleft  Lab Results Lab Results  Component Value Date   WBC 42.4 (H) 12/29/2018   WBC 41.6 (H) 12/29/2018   HGB 9.2 (L) 12/29/2018   HGB 9.2 (L) 12/29/2018   HCT 30.0 (L) 12/29/2018   HCT 29.9 (L) 12/29/2018   MCV 86.2 12/29/2018   MCV 86.2 12/29/2018   PLT 499 (  H) 12/29/2018   PLT 490 (H) 12/29/2018    Lab Results  Component Value Date   CREATININE 8.66 (H) 12/29/2018   BUN 46 (H) 12/29/2018   NA 132 (L) 12/29/2018   K 3.7 12/29/2018   CL 94 (L) 12/29/2018   CO2 24 12/29/2018    Lab Results  Component Value Date   ALT 15 12/25/2018   AST  14 (L) 12/25/2018   ALKPHOS 122 12/25/2018   BILITOT 0.8 12/25/2018     Microbiology: Recent Results (from the past 240 hour(s))  Culture, blood (routine x 2)     Status: None (Preliminary result)   Collection Time: 12/25/18 12:54 PM   Specimen: BLOOD  Result Value Ref Range Status   Specimen Description BLOOD RIGHT ANTECUBITAL  Final   Special Requests   Final    BOTTLES DRAWN AEROBIC AND ANAEROBIC Blood Culture adequate volume   Culture   Final    NO GROWTH 3 DAYS Performed at [facility], [place] [address] [place], [place] [address]    Report Status PENDING  Incomplete  SARS CORONAVIRUS 2 (TAT 6-24 HRS) Nasopharyngeal Nasopharyngeal Swab     Status: None   Collection Time: 12/25/18  5:24 PM   Specimen: Nasopharyngeal Swab  Result Value Ref Range Status   SARS Coronavirus 2 NEGATIVE NEGATIVE Final    Comment: (NOTE) SARS-CoV-2 target nucleic acids are NOT DETECTED. The SARS-CoV-2 RNA is generally detectable in upper and lower respiratory specimens during the acute phase of infection. Negative results do not preclude SARS-CoV-2 infection, do not rule out co-infections with other pathogens, and should not be used as the sole basis for treatment or other patient management decisions. Negative results must be combined with clinical observations, patient history, and epidemiological information. The expected result is Negative. Fact Sheet for Patients: SugarRoll.be Fact Sheet for Healthcare Providers: https://www.woods-mathews.com/ This test is not yet approved or cleared by the [place] FDA and  has been authorized for detection and/or diagnosis of SARS-CoV-2 by FDA under an Emergency Use Authorization (EUA). This EUA will remain  in effect (meaning this test can be used) for the duration of the COVID-19 declaration under Section 56 4(b)(1) of the Act, 21 U.S.C. section 360bbb-3(b)(1), unless the authorization is terminated  or revoked sooner. Performed at [facility], [place] [address], [place], [place] [address]   Culture, blood (Routine X 2) w Reflex to ID Panel     Status: None (Preliminary result)   Collection Time: 12/26/18  6:47 AM   Specimen: BLOOD RIGHT HAND  Result Value Ref Range Status   Specimen Description BLOOD RIGHT HAND  Final   Special Requests   Final    BOTTLES DRAWN AEROBIC ONLY Blood Culture results may not be optimal due to an inadequate volume of blood received in culture bottles   Culture   Final    NO GROWTH 2 DAYS Performed at [facility], [place] [address], [place], [place] [address]    Report Status PENDING  Incomplete  Body fluid culture     Status: None (Preliminary result)   Collection Time: 12/26/18  3:03 PM   Specimen: Peritoneal Dialysis; Body Fluid  Result Value Ref Range Status   Specimen Description PERITONEAL DIALYSIS  Final   Special Requests NONE  Final   Gram Stain   Final    RARE WBC PRESENT, PREDOMINANTLY PMN NO ORGANISMS SEEN    Culture   Final    NO GROWTH 2 DAYS  Performed at [facility], [place] [address] [place], [place] [address]    Report Status PENDING  Incomplete    Alcide Evener, [place] [facility] [place] [phone] pager  12/29/2018, 1:14 PM

## 2018-12-29 NOTE — Progress Notes (Signed)
Informed floor staff Ivor Costa, Rn that PD solution is at pharmacy to have medication installed per Dr. Marval Regal. PD Tx paused until medicated solution and staffing able to continue PD Tx.

## 2018-12-29 NOTE — Progress Notes (Addendum)
[facility] Progress Note   Subjective:  Patient seen and examined at bedside.  Reports ongoing pain in her left buttock.   Otherwise doing ok.  PD going well.  Pulled out a clot this AM after disconnecting, but all exchanges successful overnight.  Denies abdominal pain, diarrhea, change n/v.  Had a BM yesterday after about 5 days of constipation.    Objective Vitals:   12/28/18 2040 12/28/18 2104 12/29/18 0509 12/29/18 0752  BP: (!) 127/41  (!) 125/54   Pulse: (!) 52  (!) 102   Resp: 16  18   Temp: 97.9 F (36.6 C)  98.3 F (36.8 C)   TempSrc: Oral  Oral   SpO2: 98% 96% 96% 98%  Weight:      Height:       Physical Exam General:NAD, WDWN Heart:RRR Lungs:CTAB Abdomen:soft, NTND, PD cath c/d/i Extremities:no LE edema Dialysis Access: PD cath, LU AVF  [facility] Weights   12/25/18 1107 12/26/18 1715 12/27/18 1746  Weight: 73.5 kg 73.2 kg 72.1 kg    Intake/Output Summary (Last 24 hours) at 12/29/2018 1146 Last data filed at 12/28/2018 1400 Gross per 24 hour  Intake 240 ml  Output -  Net 240 ml    Additional Objective Labs: Basic Metabolic Panel: Recent Labs  Lab 12/26/18 1047 12/27/18 0842 12/28/18 0622 12/29/18 0454  NA 134* 135 132* 132*  K 4.5 3.7 3.5 3.7  CL 101 99 96* 94*  CO2 18* 22 23 24   GLUCOSE 217* 180* 162* 204*  BUN 59* 55* 47* 46*  CREATININE 11.09* 10.20* 8.65* 8.66*  CALCIUM 8.5* 8.4* 8.1* 8.2*  PHOS 5.7*  --   --   --    Liver Function Tests: Recent Labs  Lab 12/25/18 1115 12/26/18 1047  AST 14*  --   ALT 15  --   ALKPHOS 122  --   BILITOT 0.8  --   PROT 7.2  --   ALBUMIN 2.4* 2.0*   Recent Labs  Lab 12/25/18 1115  LIPASE 15   CBC: Recent Labs  Lab 12/25/18 1115 12/26/18 1047 12/27/18 0842 12/28/18 0622 12/29/18 0454  WBC 26.4* 33.2* 38.9* 39.3* 41.6*  42.4*  NEUTROABS  --  30.2*  --   --  36.3*  HGB 10.3* 8.9* 8.7* 8.5* 9.2*  9.2*  HCT 33.6* 28.8* 28.1* 27.6* 29.9*  30.0*  MCV 86.2 85.0 84.6 85.2 86.2   86.2  PLT 453* 425* 432* 459* 490*  499*   Blood Culture    Component Value Date/Time   SDES PERITONEAL DIALYSIS 12/26/2018 1503   SPECREQUEST NONE 12/26/2018 1503   CULT  12/26/2018 1503    NO GROWTH 2 DAYS Performed at [facility], [place] [address] [place],  [address]    REPTSTATUS PENDING 12/26/2018 1503   CBG: Recent Labs  Lab 12/28/18 0758 12/28/18 1109 12/28/18 1729 12/28/18 2042 12/29/18 0755  GLUCAP 156* 137* 144* 245* 180*    Lab Results  Component Value Date   INR 0.91 02/01/2017   INR 0.9 07/26/2016   INR 1.07 10/23/2014   Studies/Results: CT ABDOMEN PELVIS W CONTRAST  Result Date: 12/27/2018 CLINICAL DATA:  Anal/rectal abscess EXAM: CT ABDOMEN AND PELVIS WITH CONTRAST TECHNIQUE: Multidetector CT imaging of the abdomen and pelvis was performed using the standard protocol following bolus administration of intravenous contrast. CONTRAST:  123mL OMNIPAQUE IOHEXOL 300 MG/ML  SOLN COMPARISON:  12/25/2018 FINDINGS: Lower chest: No acute abnormality Hepatobiliary: No focal liver abnormality is seen. Status  post cholecystectomy. No biliary dilatation. Pancreas: No focal abnormality or ductal dilatation. Spleen: No focal abnormality.  Normal size. Adrenals/Urinary Tract: No adrenal abnormality. No focal renal abnormality. No stones or hydronephrosis. Urinary bladder is unremarkable. Stomach/Bowel: Stomach, large and small bowel grossly unremarkable. Vascular/Lymphatic: Aortoiliac atherosclerosis. No aneurysm or adenopathy. Reproductive: Prior hysterectomy.  No adnexal masses. Other: No free fluid or free air. Again noted is inflammation/stranding in the superior gluteal cleft. Enlarging subcutaneous fluid collection noted in the left medial buttock measuring 2.9 x 1.9 cm on image 92 of series 5. Musculoskeletal: No acute bony abnormality. IMPRESSION: Continued stranding/inflammation in the gluteal subcutaneous soft tissues at the superior gluteal cleft with  enlarging fluid collection in the medial left buttock subcutaneous soft tissues measuring 2.9 x 1.9 cm. Aortoiliac atherosclerosis. Prior cholecystectomy and hysterectomy. Electronically Signed   By: Rolm Baptise M.D.   On: 12/27/2018 17:47    Medications: . ampicillin-sulbactam (UNASYN) IV 1.5 g (12/29/18 1610)  . dialysis solution 1.5% low-MG/low-CA    . metronidazole 500 mg (12/29/18 1042)   . acetaminophen  650 mg Oral Q6H  . amitriptyline  10 mg Oral QHS  . aspirin EC  81 mg Oral Daily  . calcitRIOL  1.5 mcg Oral Daily  . darbepoetin (ARANESP) injection - DIALYSIS  60 mcg Subcutaneous Q Wed-1800  . gentamicin cream  1 application Topical Daily  . heparin  5,000 Units Subcutaneous Q8H  . insulin aspart  0-6 Units Subcutaneous TID WC  . metoCLOPramide  5 mg Oral TID AC & HS  . metoprolol succinate  50 mg Oral Daily  . mometasone-formoterol  2 puff Inhalation BID  . oxyCODONE  5 mg Oral Q6H  . pantoprazole  40 mg Oral Daily  . pravastatin  20 mg Oral q1800  . pregabalin  75 mg Oral Daily    Dialysis Orders: CCPD - 1 2.5 5 L bag and 1 1.5 5 L bag - 5 fills - 2 L fills with 1 L in last fill dwell 1.5 hr   Assessment/Plan: 1. Gluteal abscess - WBC 25 > 33 K > 38.9 > 41.6.  Surgery aspirated sacral fluid yesterday which revealed only old blood.  Recommend aggressive infectious workup including UA/UC.  Antibiotics/work up per primary team.   2.  Gastroparesis - chronic problem -on chronic Reglan 5 mg. At baseline per patient.  3. Peritonitis - cell count 333 - with 95% neutrophils - culture sent with no organisms on gram stain.  Vanc and aztreonam would cover empirically for peritonitis and she has no abdominal pain.  Favor gluteal abscess or alternate source as etiology of leukocytosis.  Per radiology eval of 12/14 CT scan there is no communication of the abscess with the peritoneum  4. ESRD - on PD- continue 1.5% solution.  Has an AVF so could easily transition to HD if she had surgery  for abscess that might warrant a holiday from PD. No communication with peritoneum on 12/14 scan per radiology. .    5. Anemia - hgb 10.3 > 8.7>9.2  hold oral Fe to lessen pill burden with gastroparesis - start Aranesp 60 - has had in the past here - gets Mircera as outpatient (last dose in Sept) 6. Secondary hyperparathyroidism - Ca 8.2 on 3 Ca acetate ac - ws held to lessen pill burden and with nausea - resume at d/c - continue calcitriol  7. HTN/volume - BP variable- well controlled today.  Does not appear volume overloaded. On Toprol 8 . DM- per  primary  Jen Mow, PA-C [facility] Pager: [phone] 12/29/2018,11:46 AM  LOS: 3 days   I have seen and examined this patient and agree with plan and assessment in the above note with renal recommendations/intervention highlighted.  Activase was successful in clearing clot from PD catheter and able to drain without further complications.   Governor Rooks Pegi Milazzo,MD 12/29/2018 3:51 PM

## 2018-12-29 NOTE — Progress Notes (Signed)
Family Medicine Teaching Service Daily Progress Note Intern Pager: [phone]  Patient name: Susan Horton Medical record number: [id number] Date of birth: 1970/07/21 Age: 48 y.o. Gender: female  Primary Care Provider: Charlott Rakes, MD Consultants: Surgery Code Status: Code  Pt Overview and Major Events to Date:  12/25/2018-patient admitted to inpatient teaching service for sacral phlegmon  Assessment and Plan: Susan Horton is a 48 y.o. female presenting with sacral phlegmon. PMH is significant for ESRD on home PD, T2DM, HTN, CHF, PVD.  Sacral phlegmon Patient fell approximately a week ago and has had pain in her sacral area since that time.  In the ED patient was afebrile, WBC-24.6, CT abdomen and pelvis showed extensive stranding and phlegmonous changes in the sacral area.  Patient was started on vancomycin, metronidazole, and aztreonam on 12/14.  Surgery consulted and is following.WBC 26.4> 33.2> 38.9>42.4.  Repeat CT on 12/16 showed continued stranding inflammation gluteal subcutaneous soft tissues with an enlarging fluid collection in the medial left buttocks subcutaneous soft tissue measuring 2.9 x 1.9 cm.  Surgery aspirated 3 cc of what appeared to be hematoma. -Blood cultures no growth x2 days -Vancomycin (12/14-12/17) has been discontinued -Aztreonam discontinued 12/17 -Discontinue metronidazole per pharmacy -Unasyn started 12/17 -General surgery consult, appreciate recommendations -Pain control with scheduled Tylenol -OxyIR or Dilaudid as needed -Tylenol every 6 hours scheduled -Vitals per routine -Restart home aspirin and Plavix   Constipation Patient reports that she has not had a bowel movement in over a week.  She says that this is because it hurts for her to strain to try and have a bowel movement.  She tried to push but nothing comes out.  Patient may go to surgery today and would rather deal with the constipation prior to her procedure rather than after. -Fleet  enema -Monitor bowel movement status  ESRD on home PD Patient gets nighttime home peritoneal dialysis.  Home medications include PhosLo, calcitriol.  Patient is scheduled for peritoneal dialysis nightly. Fluid was clear, given increasing white count while on appropriate antibiotics there is concern for SBP.  Culture and cell count were obtained.  No history of peritonitis.  Nephrology is following.  Increasing concern for SBP.  White count continues to climb and is now at 42.4.  Consult infectious disease. -Infectious disease consulted, appreciate recommendations -Nephrology consulted, appreciate recommendations -Peritoneal fluid Gram stain showed rare WBCs present, predominantly PMN, no organisms seen -Peritoneal culture-no growth x1 day -Continue home peritoneal dialysis schedule -Continue home medications  CAD Follows with [facility]. Patient has history of CAD and is on aspirin, Plavix and metoprolol.  Patient had drug-eluting stent placed in 2018. -Restart home aspirin and Plavix -Continue home metoprolol  PAD Patient has history of significant peripheral artery disease.  She is status post right SFA intervention by Dr. Carlis Abbott on 12/14/2018.  Medications include Plavix, aspirin, lovastatin 20 mg daily -Holding home aspirin and Plavix -Switch to pravastatin 20 mg due to it being formulary in the hospital.  HFpEF Acute on chronic diastolic heart failure as listed in the patient's problem list and addressed in cardiology notes.  Patient recently had an echocardiogram as of 12/11 which showed an EF of 60 to 65% with normal left ventricular normal right ventricular function.  Echo from 2019 showed EF of 55 to 60% with G2DD.  Home medications as metoprolol XL -Monitor signs and symptoms of heart failure -Continue home metoprolol XL dose  T2 DM with peripheral neuropathy Patient's home medications include Toujeo 25 units  twice daily and humulin SSI.  Hemoglobin A1c on  09/14/2017 was 8.3.  She also takes Lyrica for nerve pain.  Patient also takes amitriptyline 10 mg at bedtime.  Repeat hemoglobin A1c was 7.5 -Holding home Toujeo -Sensitive sliding scale -Continue home Lyrica and amitriptyline -CBGs with meals  Gastroparesis Patient has history of gastroparesis for which she takes metoclopramide 5 mg at home. -Continue home metoclopramide  HTN Blood pressures have ranged from 115/54-147/63. Patient's home medications include Toprol-XL 50 mg daily -Continue home medications  Asthma Home medications include Singulair PRN at bedtime, Advair twice daily and Ventolin as needed. -Continue home asthma medications -Holding Singulair at this time because it is as needed  FEN/GI: Renal/carb modified PPx: SCDs  Disposition: Pending further evaluation  Subjective:  Patient reports that her pain is still different.  She denies any fever.  She does report that the pressures decreased in her lower back.  She also reports she had a bowel movement yesterday.  Objective: Temp:  [97.9 F (36.6 C)-98.5 F (36.9 C)] 98.3 F (36.8 C) (12/18 0509) Pulse Rate:  [52-105] 102 (12/18 0509) Resp:  [16-21] 18 (12/18 0509) BP: (109-129)/(41-83) 125/54 (12/18 0509) SpO2:  [96 %-100 %] 96 % (12/18 0509) General: Lying in bed, looks uncomfortable HEENT: Atraumatic. Normocephalic.  Cardiac: RRR, no m/r/g Respiratory: CTAB, normal work of breathing Abdomen: Nontender.  She has bowel sounds.  Peritoneal dialysis catheter in place with clean dressing.  No erythema or edema surrounding insertion. Skin: warm and dry, edema in sacral area decreased from yesterday and none erythematous. Neuro: alert and oriented   Laboratory: Recent Labs  Lab 12/27/18 0842 12/28/18 0622 12/29/18 0454  WBC 38.9* 39.3* 42.4*  HGB 8.7* 8.5* 9.2*  HCT 28.1* 27.6* 30.0*  PLT 432* 459* 499*   Recent Labs  Lab 12/25/18 1115 12/27/18 0842 12/28/18 0622 12/29/18 0454  NA 138 135 132*  132*  K 4.2 3.7 3.5 3.7  CL 99 99 96* 94*  CO2 22 22 23 24   BUN 55* 55* 47* 46*  CREATININE 11.09* 10.20* 8.65* 8.66*  CALCIUM 8.8* 8.4* 8.1* 8.2*  PROT 7.2  --   --   --   BILITOT 0.8  --   --   --   ALKPHOS 122  --   --   --   ALT 15  --   --   --   AST 14*  --   --   --   GLUCOSE 140* 180* 162* 204*   Lactic acid-1.6  Imaging/Diagnostic Tests: CT ABDOMEN PELVIS W CONTRAST  Result Date: 12/27/2018 CLINICAL DATA:  Anal/rectal abscess EXAM: CT ABDOMEN AND PELVIS WITH CONTRAST TECHNIQUE: Multidetector CT imaging of the abdomen and pelvis was performed using the standard protocol following bolus administration of intravenous contrast. CONTRAST:  157mL OMNIPAQUE IOHEXOL 300 MG/ML  SOLN COMPARISON:  12/25/2018 FINDINGS: Lower chest: No acute abnormality Hepatobiliary: No focal liver abnormality is seen. Status post cholecystectomy. No biliary dilatation. Pancreas: No focal abnormality or ductal dilatation. Spleen: No focal abnormality.  Normal size. Adrenals/Urinary Tract: No adrenal abnormality. No focal renal abnormality. No stones or hydronephrosis. Urinary bladder is unremarkable. Stomach/Bowel: Stomach, large and small bowel grossly unremarkable. Vascular/Lymphatic: Aortoiliac atherosclerosis. No aneurysm or adenopathy. Reproductive: Prior hysterectomy.  No adnexal masses. Other: No free fluid or free air. Again noted is inflammation/stranding in the superior gluteal cleft. Enlarging subcutaneous fluid collection noted in the left medial buttock measuring 2.9 x 1.9 cm on image 92 of series 5. Musculoskeletal: No  acute bony abnormality. IMPRESSION: Continued stranding/inflammation in the gluteal subcutaneous soft tissues at the superior gluteal cleft with enlarging fluid collection in the medial left buttock subcutaneous soft tissues measuring 2.9 x 1.9 cm. Aortoiliac atherosclerosis. Prior cholecystectomy and hysterectomy. Electronically Signed   By: Rolm Baptise M.D.   On: 12/27/2018 17:47     Gifford Shave, MD 12/29/2018, 6:17 AM PGY-1, [facility] Intern pager: [phone], text pages welcome

## 2018-12-29 NOTE — Final Consult Note (Signed)
Consultant Final Sign-Off Note    Assessment/Final recommendations  Susan Horton is a 48 y.o. female followed by me for sacral phlegmon seen on CT scan on 12/14 w/2 x 1 x 1 cmphlegmon. This was suspected to be a hematoma after GLF on 12/9.Follow up CT 12/16 w/ enlarging fluid collection in the medial left buttock subcutaneous soft tissues measuring 2.9 x 1.9 cm  Patient is s/p bedside aspiration by Dr. Grandville Silos on 12/17 that revealed 3cc's of old blood product, no pus. Leukocytosis is worsening despite broad-spectrum antibiotics and aspiration. Do not feel sacral fluid collection that was aspirated and revealed old hematoma is the cause of this. Discussed with her this morning, she feels the pressure with urination is similar to when she has had UTI's in the past. She still makes urine and on her prior scan, she had some peri-nephric stranding. With this, we recommend a UA to r/o UTI. She also had peritoneal sampling previously that showed an elevated neutrophil count and in the setting of undergoing peritoneal dialysis, her risk of having infectious peritonitis is increased. Fluids have been sent for gram stain and culture by nephrology. Other workup per primary team.  - I have called and discussed with the primary team that we are signing off  Wound care (if applicable):    Diet at discharge: per primary team   Activity at discharge: per primary team   Follow-up appointment: PRN   Pending results:  Unresulted Labs (From admission, onward)    Start     Ordered   12/29/18 0500  CBC  Daily,   R    Question:  Specimen collection method  Answer:  Lab=Lab collect   12/28/18 1557   12/29/18 0630  Basic metabolic panel  Daily,   R    Question:  Specimen collection method  Answer:  Lab=Lab collect   12/28/18 1557   12/28/18 0701  Urinalysis, Complete w Microscopic  Once,   R     12/28/18 0700   12/25/18 1109  Urinalysis, Routine w reflex microscopic  ONCE - STAT,   STAT     12/25/18 1108           Medication recommendations: Abx per primary team   Other recommendations:    Thank you for allowing [name] to participate in the care of your patient!  Please consult [name] again if you have further needs for your patient.  Barth Kirks [facility] 12/29/2018 8:03 AM    Subjective   Patient reports that her pain is the same to a little more tolerable today. Unable to urinate yesterday. Does report having pressure previous days that feels similar to when she has had UTI's in the past. Began having some left sided chest pain yesterday afternoon that she thinks may be muscular. It hurts more with palpation and when trying to move herself in bed. No sob. No fever. Denies abdominal pain, n/v. Had a normal BM yesterday.   Objective  Vital signs in last 24 hours: Temp:  [97.9 F (36.6 C)-98.5 F (36.9 C)] 98.3 F (36.8 C) (12/18 0509) Pulse Rate:  [52-105] 102 (12/18 0509) Resp:  [16-21] 18 (12/18 0509) BP: (109-129)/(41-83) 125/54 (12/18 0509) SpO2:  [96 %-100 %] 98 % (12/18 0752)  Gen: Alert, NAD, calm Heart: RRR Pulm:Rate andeffort normal, clear b/l Abd; Soft, ND, NT, undergoing PD GU: Induration of R and L intergluteal cleft improved from yesterday. Less TTP. No fluctuance noted. No drainage. Skin: no rashes noted, warm and dry  Pertinent labs and Studies: Recent Labs    12/27/18 0842 12/28/18 0622 12/29/18 0454  WBC 38.9* 39.3* 42.4*  HGB 8.7* 8.5* 9.2*  HCT 28.1* 27.6* 30.0*   BMET Recent Labs    12/28/18 0622 12/29/18 0454  NA 132* 132*  K 3.5 3.7  CL 96* 94*  CO2 23 24  GLUCOSE 162* 204*  BUN 47* 46*  CREATININE 8.65* 8.66*  CALCIUM 8.1* 8.2*   No results for input(s): LABURIN in the last 72 hours. Results for orders placed or performed during the hospital encounter of 12/25/18  Culture, blood (routine x 2)     Status: None (Preliminary result)   Collection Time: 12/25/18 12:54 PM   Specimen: BLOOD  Result Value Ref Range Status   Specimen  Description BLOOD RIGHT ANTECUBITAL  Final   Special Requests   Final    BOTTLES DRAWN AEROBIC AND ANAEROBIC Blood Culture adequate volume   Culture   Final    NO GROWTH 3 DAYS Performed at [facility], [place] [address], [place], [place] [address]    Report Status PENDING  Incomplete  SARS CORONAVIRUS 2 (TAT 6-24 HRS) Nasopharyngeal Nasopharyngeal Swab     Status: None   Collection Time: 12/25/18  5:24 PM   Specimen: Nasopharyngeal Swab  Result Value Ref Range Status   SARS Coronavirus 2 NEGATIVE NEGATIVE Final    Comment: (NOTE) SARS-CoV-2 target nucleic acids are NOT DETECTED. The SARS-CoV-2 RNA is generally detectable in upper and lower respiratory specimens during the acute phase of infection. Negative results do not preclude SARS-CoV-2 infection, do not rule out co-infections with other pathogens, and should not be used as the sole basis for treatment or other patient management decisions. Negative results must be combined with clinical observations, patient history, and epidemiological information. The expected result is Negative. Fact Sheet for Patients: SugarRoll.be Fact Sheet for Healthcare Providers: https://www.woods-mathews.com/ This test is not yet approved or cleared by the [place] FDA and  has been authorized for detection and/or diagnosis of SARS-CoV-2 by FDA under an Emergency Use Authorization (EUA). This EUA will remain  in effect (meaning this test can be used) for the duration of the COVID-19 declaration under Section 56 4(b)(1) of the Act, 21 U.S.C. section 360bbb-3(b)(1), unless the authorization is terminated or revoked sooner. Performed at [facility], [place] [address] [place], [place] [address]   Culture, blood (Routine X 2) w Reflex to ID Panel     Status: None (Preliminary result)   Collection Time: 12/26/18  6:47 AM   Specimen: BLOOD RIGHT HAND  Result Value Ref Range Status   Specimen  Description BLOOD RIGHT HAND  Final   Special Requests   Final    BOTTLES DRAWN AEROBIC ONLY Blood Culture results may not be optimal due to an inadequate volume of blood received in culture bottles   Culture   Final    NO GROWTH 2 DAYS Performed at [facility], [place] [address], [place],  [address]    Report Status PENDING  Incomplete  Body fluid culture     Status: None (Preliminary result)   Collection Time: 12/26/18  3:03 PM   Specimen: Peritoneal Dialysis; Body Fluid  Result Value Ref Range Status   Specimen Description PERITONEAL DIALYSIS  Final   Special Requests NONE  Final   Gram Stain   Final    RARE WBC PRESENT, PREDOMINANTLY PMN NO ORGANISMS SEEN    Culture   Final  NO GROWTH 1 DAY Performed at [facility], [place] [address] [place], [place] [address]    Report Status PENDING  Incomplete    Imaging: No results found.

## 2018-12-30 LAB — CBC
HCT: 26.5 % — ABNORMAL LOW (ref 36.0–46.0)
Hemoglobin: 8.1 g/dL — ABNORMAL LOW (ref 12.0–15.0)
MCH: 26.4 pg (ref 26.0–34.0)
MCHC: 30.6 g/dL (ref 30.0–36.0)
MCV: 86.3 fL (ref 80.0–100.0)
Platelets: 564 10*3/uL — ABNORMAL HIGH (ref 150–400)
RBC: 3.07 MIL/uL — ABNORMAL LOW (ref 3.87–5.11)
RDW: 14.6 % (ref 11.5–15.5)
WBC: 49.4 10*3/uL — ABNORMAL HIGH (ref 4.0–10.5)
nRBC: 0.1 % (ref 0.0–0.2)

## 2018-12-30 LAB — BODY FLUID CULTURE: Culture: NO GROWTH

## 2018-12-30 LAB — BASIC METABOLIC PANEL
Anion gap: 15 (ref 5–15)
BUN: 48 mg/dL — ABNORMAL HIGH (ref 6–20)
CO2: 23 mmol/L (ref 22–32)
Calcium: 8.4 mg/dL — ABNORMAL LOW (ref 8.9–10.3)
Chloride: 91 mmol/L — ABNORMAL LOW (ref 98–111)
Creatinine, Ser: 9.18 mg/dL — ABNORMAL HIGH (ref 0.44–1.00)
GFR calc Af Amer: 5 mL/min — ABNORMAL LOW (ref >60)
GFR calc non Af Amer: 5 mL/min — ABNORMAL LOW (ref >60)
Glucose, Bld: 144 mg/dL — ABNORMAL HIGH (ref 70–99)
Potassium: 3.1 mmol/L — ABNORMAL LOW (ref 3.5–5.1)
Sodium: 129 mmol/L — ABNORMAL LOW (ref 135–145)

## 2018-12-30 LAB — GLUCOSE, CAPILLARY
Glucose-Capillary: 201 mg/dL — ABNORMAL HIGH (ref 70–99)
Glucose-Capillary: 168 mg/dL — ABNORMAL HIGH (ref 70–99)
Glucose-Capillary: 94 mg/dL (ref 70–99)
Glucose-Capillary: 113 mg/dL — ABNORMAL HIGH (ref 70–99)

## 2018-12-30 LAB — CULTURE, BLOOD (ROUTINE X 2)
Culture: NO GROWTH
Special Requests: ADEQUATE

## 2018-12-30 LAB — HIV ANTIBODY (ROUTINE TESTING W REFLEX): HIV Screen 4th Generation wRfx: NONREACTIVE

## 2018-12-30 MED ORDER — HYDROMORPHONE HCL 1 MG/ML PO LIQD: 0.5000 mg | Freq: Once | ORAL | Status: DC | PRN

## 2018-12-30 MED ORDER — HEPARIN 1000 UNIT/ML FOR PERITONEAL DIALYSIS: 500.0000 [IU] | INTRAMUSCULAR | Status: DC | PRN

## 2018-12-30 MED ORDER — DELFLEX-LC/1.5% DEXTROSE 344 MOSM/L IP SOLN: INTRAPERITONEAL | Status: DC

## 2018-12-30 MED ORDER — HEPARIN 1000 UNIT/ML FOR PERITONEAL DIALYSIS
INTRAPERITONEAL | Status: DC | PRN
  Filled 2018-12-30 (×8): qty 5000

## 2018-12-30 MED ORDER — GENTAMICIN SULFATE 0.1 % EX CREA: 1.0000 "application " | TOPICAL_CREAM | Freq: Every day | CUTANEOUS | Status: DC

## 2018-12-30 MED ORDER — LORAZEPAM 2 MG/ML IJ SOLN
0.5000 mg | Freq: Once | INTRAMUSCULAR | Status: AC | PRN
  Administered 2018-12-31: 0.5 mg via INTRAVENOUS
  Filled 2018-12-30: qty 1

## 2018-12-30 MED ORDER — HYDROMORPHONE HCL 1 MG/ML IJ SOLN
0.5000 mg | Freq: Once | INTRAMUSCULAR | Status: AC | PRN
  Administered 2018-12-31: 0.5 mg via INTRAVENOUS
  Filled 2018-12-30: qty 1

## 2018-12-30 NOTE — Progress Notes (Addendum)
Pt seen at bedside, alert/oriented in no apparent distress. This Probation officer about to initiate PD tx as ordered. But pt refused to be dialyzed tonight.I'm skipping my dialysis tonight , I will be ok"  I  Will be having a MRI procedure tomorrow and I'm worried, I wont be able to be disconnected on time.  And per primary nurse the MRI was scheduled this pm, and she wasn't disconnected on time,and the MRI has been rescheduled for tomorrow.. On call nephrologist made aware of above. HD charge nurse aware.

## 2018-12-30 NOTE — Progress Notes (Addendum)
Susan Horton Progress Note   Subjective:   Patient seen and examined at bedside.  Increased pain in her buttocks.  Denies abdominal pain, change in n/v, SOB, and CP.  Reports large BM yesterday after taking laxatives.   Objective Vitals:   12/30/18 0143 12/30/18 0501 12/30/18 0630 12/30/18 0754  BP: 132/62 (!) 116/48    Pulse: 87 (!) 47    Resp: 16 16    Temp: 99 F (37.2 C) 98.7 F (37.1 C)    TempSrc: Oral Oral    SpO2: 99% 94%  96%  Weight:   78.9 kg   Height:       Physical Exam General:NAD, WDWN female, laying on her side in bed Heart:RRR Lungs:CTAB Abdomen:soft, NT, +distention Extremities:no LE edema Dialysis Access: PD cath,  LU AVF  [facility] Weights   12/26/18 1715 12/27/18 1746 12/30/18 0630  Weight: 73.2 kg 72.1 kg 78.9 kg    Intake/Output Summary (Last 24 hours) at 12/30/2018 1438 Last data filed at 12/30/2018 0840 Gross per 24 hour  Intake 1618.42 ml  Output 0 ml  Net 1618.42 ml    Additional Objective Labs: Basic Metabolic Panel: Recent Labs  Lab 12/26/18 1047 12/28/18 0622 12/29/18 0454 12/30/18 0358  NA 134* 132* 132* 129*  K 4.5 3.5 3.7 3.1*  CL 101 96* 94* 91*  CO2 18* 23 24 23   GLUCOSE 217* 162* 204* 144*  BUN 59* 47* 46* 48*  CREATININE 11.09* 8.65* 8.66* 9.18*  CALCIUM 8.5* 8.1* 8.2* 8.4*  PHOS 5.7*  --   --   --    Liver Function Tests: Recent Labs  Lab 12/25/18 1115 12/26/18 1047  AST 14*  --   ALT 15  --   ALKPHOS 122  --   BILITOT 0.8  --   PROT 7.2  --   ALBUMIN 2.4* 2.0*   Recent Labs  Lab 12/25/18 1115  LIPASE 15   CBC: Recent Labs  Lab 12/26/18 1047 12/27/18 0842 12/28/18 0622 12/29/18 0454 12/30/18 0358  WBC 33.2* 38.9* 39.3* 41.6*  42.4* 49.4*  NEUTROABS 30.2*  --   --  36.3*  --   HGB 8.9* 8.7* 8.5* 9.2*  9.2* 8.1*  HCT 28.8* 28.1* 27.6* 29.9*  30.0* 26.5*  MCV 85.0 84.6 85.2 86.2  86.2 86.3  PLT 425* 432* 459* 490*  499* 564*   Blood Culture    Component Value Date/Time   SDES PERITONEAL DIALYSIS 12/26/2018 1503   SPECREQUEST NONE 12/26/2018 1503   CULT  12/26/2018 1503    NO GROWTH 3 DAYS Performed at [facility], [place] [address], [place], [place] [address]    REPTSTATUS 12/30/2018 FINAL 12/26/2018 1503    Cardiac Enzymes: No results for input(s): CKTOTAL, CKMB, CKMBINDEX, TROPONINI in the last 168 hours. CBG: Recent Labs  Lab 12/29/18 1152 12/29/18 1648 12/29/18 2052 12/30/18 0733 12/30/18 1231  GLUCAP 230* 186* 124* 201* 168*   Iron Studies: No results for input(s): IRON, TIBC, TRANSFERRIN, FERRITIN in the last 72 hours. Lab Results  Component Value Date   INR 0.91 02/01/2017   INR 0.9 07/26/2016   INR 1.07 10/23/2014   Studies/Results: No results found.  Medications: . dialysis solution 1.5% low-MG/low-CA    . linezolid (ZYVOX) IV 600 mg (12/30/18 1003)   . acetaminophen  650 mg Oral Q6H  . amitriptyline  10 mg Oral QHS  . aspirin EC  81 mg Oral Daily  . calcitRIOL  1.5 mcg Oral Daily  .  darbepoetin (ARANESP) injection - DIALYSIS  60 mcg Subcutaneous Q Wed-1800  . gentamicin cream  1 application Topical Daily  . heparin  5,000 Units Subcutaneous Q8H  . insulin aspart  0-6 Units Subcutaneous TID WC  . insulin glargine  10 Units Subcutaneous QHS  . metoCLOPramide  5 mg Oral TID AC & HS  . metoprolol succinate  50 mg Oral Daily  . mometasone-formoterol  2 puff Inhalation BID  . oxyCODONE  5 mg Oral Q6H  . pantoprazole  40 mg Oral Daily  . pravastatin  20 mg Oral q1800  . pregabalin  75 mg Oral Daily    Dialysis Orders: [facility] - 1 2.5 5 L bag and 1 1.5 5 L bag - 5 fills - 2 L fills with 1 L in last fill dwell 1.5 hr   Assessment/Plan: 1. Gluteal abscess - Surgery aspirated sacral fluid 12/17 which revealed only old blood. ID consulting. MRI buttocks.  2. Leukocytosis - WBC 25 >33 K >38.9 > 41.6>49.4. Gluteal abscess vs leukocytosis.   ID consulting.  On linezolid. MRI buttocks, if negative and no response to linezolid,  they would consider removing PD catheter. 2. Gastroparesis - chronic problem -on chronic Reglan 5 mg. At baseline per patient.  3.Peritonitis - cell count 333 - with 95% neutrophils - culture sentwith no organisms on gram stain. No abdominal pain. Per radiology eval of 12/14 CT scan there is no communication of the abscess with the peritoneum  4. ESRD- on PD- continue 1.5% solution. Has an AVF so could easily transition to HD if she had surgery for abscess that might warrant a holiday from PD or if PD catheter had to be removed.No communication with peritoneum on 12/14 scan per radiology.PD cath clotting, improved with activase.  Using heparinized solution.  5. Anemia- hgb 10.3 >8.7>9.2 hold oral Fe to lessen pill burden with gastroparesis - start Aranesp 60 - has had in the past here - gets Mircera as outpatient (last dose in Sept) 6. Secondary hyperparathyroidism- Ca 8.4 on 3 Ca acetate ac - was heldto lessen pill burdenand with nausea- resume at d/c - continue calcitriol. Recheck phos 7.HTN/volume- BP variable- well controlled today.  Does not appear volume overloaded. On Toprol 8 . DM- per primary   Jen Mow, PA-C [facility] Pager: [phone] 12/30/2018,2:38 PM  LOS: 4 days   I have seen and examined this patient and agree with plan and assessment in the above note with renal recommendations/intervention highlighted.  Still complaining of gluteal pain.  She reports that she uses heparin each night with [facility] so will continue to add heparin to her bags.  Governor Rooks Sheranda Seabrooks,MD 12/30/2018 3:56 PM

## 2018-12-30 NOTE — Progress Notes (Signed)
Family Medicine Teaching Service Daily Progress Note Intern Pager: [phone]  Patient name: Susan Horton Medical record number: [id number] Date of birth: Nov 21, 1970 Age: 48 y.o. Gender: female  Primary Care Provider: Charlott Rakes, MD Consultants: Surgery Code Status: Code  Pt Overview and Major Events to Date:  12/25/2018-patient admitted to inpatient teaching service for sacral phlegmon  Assessment and Plan: Susan Horton is a 48 y.o. female presenting with sacral phlegmon. PMH is significant for ESRD on home PD, T2DM, HTN, CHF, PVD.  Sacral phlegmon Patient fell approximately a week ago and has had pain in her sacral area since that time.  In the ED patient was afebrile, WBC-24.6, CT abdomen and pelvis showed extensive stranding and phlegmonous changes in the sacral area.  Patient was started on vancomycin, metronidazole, and aztreonam on 12/14.  Surgery consulted and is following.WBC 26.4> 33.2> 38.9>42.4>49.4.  Repeat CT on 12/16 showed continued stranding inflammation gluteal subcutaneous soft tissues with an enlarging fluid collection in the medial left buttocks subcutaneous soft tissue measuring 2.9 x 1.9 cm.  Surgery aspirated 3 cc of what appeared to be hematoma.  Patient was seen by infectious disease on 12/18 and they believe the source of the elevated white count is most likely the buttocks.  They agree that an MRI is reasonable and recommend starting Zyvox to cover MRSA as well as DC Augmentin. -Blood cultures no growth x2 days -Vancomycin (12/14-12/17) has been discontinued, Aztreonam and metronidazole discontinued 12/17, Unasyn was started 12/17 -ID consults, recommendations appreciated -Linezolid started 12/18 -MRI of buttocks ordered, patient may require Ativan and/or additional Dilaudid for this given she has to lay on her back. -General surgery consult, appreciate recommendations -Pain control with scheduled Tylenol -OxyIR or Dilaudid as needed -Tylenol every 6 hours  scheduled -Vitals per routine -Restart home aspirin and Plavix   Constipation Patient reports that she has not had a bowel movement in over a week.  She says that this is because it hurts for her to strain to try and have a bowel movement.  She tried to push but nothing comes out.  Patient may go to surgery today and would rather deal with the constipation prior to her procedure rather than after. -Fleet enema -Monitor bowel movement status  ESRD on home PD Patient gets nighttime home peritoneal dialysis.  Home medications include PhosLo, calcitriol.  Patient is scheduled for peritoneal dialysis nightly. Fluid was clear, given increasing white count while on appropriate antibiotics there is concern for SBP.  Culture and cell count were obtained.  No history of peritonitis.  Nephrology is following.  Increasing concern for SBP.  White count continues to climb and is now at 49.4.  Infectious disease consulted and they recommend not collecting a UA, they believe that it is most likely due to a buttocks infection but may be caused by SBP. -Infectious disease consulted, appreciate recommendations -Nephrology consulted, appreciate recommendations -Peritoneal fluid Gram stain showed rare WBCs present, predominantly PMN, no organisms seen -Peritoneal culture-no growth x1 day  -Continue home peritoneal dialysis schedule -Continue home medications -Linezolid started 12/29/2018  [facility] Follow[facility] with [facility]. Patient has history of CAD and is on aspirin, Plavix and metoprolol.  Patient had drug-eluting stent placed in 2018. -Restart home aspirin and Plavix -Continue home metoprolol  PAD Patient has history of significant peripheral artery disease.  She is status post right SFA intervention by Dr. Carlis Abbott on 12/14/2018.  Medications include Plavix, aspirin, lovastatin 20 mg daily -Holding home aspirin and Plavix -Switch  to pravastatin 20 mg due to it being formulary in the  hospital.  HFpEF Acute on chronic diastolic heart failure as listed in the patient's problem list and addressed in cardiology notes.  Patient recently had an echocardiogram as of 12/11 which showed an EF of 60 to 65% with normal left ventricular normal right ventricular function.  Echo from 2019 showed EF of 55 to 60% with G2DD.  Home medications as metoprolol XL -Monitor signs and symptoms of heart failure -Continue home metoprolol XL dose  T2 DM with peripheral neuropathy Patient's home medications include Toujeo 25 units twice daily and humulin SSI.  Hemoglobin A1c on 09/14/2017 was 8.3.  She also takes Lyrica for nerve pain.  Patient also takes amitriptyline 10 mg at bedtime.  Repeat hemoglobin A1c was 7.5.  Blood sugars yesterday were in the high 100s to 200s so 10 units of Lantus was started -Holding home Toujeo -Sensitive sliding scale -10 units Lantus daily -Continue home Lyrica and amitriptyline -CBGs with meals  Gastroparesis Patient has history of gastroparesis for which she takes metoclopramide 5 mg at home. -Continue home metoclopramide  HTN Blood pressures have ranged from 115/54-147/63. Patient's home medications include Toprol-XL 50 mg daily -Continue home medications  Asthma Home medications include Singulair PRN at bedtime, Advair twice daily and Ventolin as needed. -Continue home asthma medications -Holding Singulair at this time because it is as needed  FEN/GI: Renal/carb modified PPx: SCDs  Disposition: Pending further evaluation  Subjective:  Patient is doing "okay" this morning.  She says that she had an episode of very soft stool last night that she attributes to the use of the stool softeners and being constipated for so long.  She reports that the pain in her buttocks is still there and hard to control.  Denies any abdominal pain.  Objective: Temp:  [98.2 F (36.8 C)-99.1 F (37.3 C)] 98.7 F (37.1 C) (12/19 0501) Pulse Rate:  [47-88] 47 (12/19  0501) Resp:  [16] 16 (12/19 0501) BP: (105-132)/(48-62) 116/48 (12/19 0501) SpO2:  [94 %-100 %] 96 % (12/19 0754) Weight:  [78.9 kg] 78.9 kg (12/19 0630) General: Lying in bed on right side, looks uncomfortable, tinea dialysis is running HEENT: Atraumatic. Normocephalic.  Cardiac: Regular rate and rhythm with no murmurs appreciated Respiratory: Clear to auscultation bilaterally, normal work of breathing Abdomen: Nontender.  She has bowel sounds.  Peritoneal dialysis catheter in place with clean dressing and no erythema or edema surrounding insertion site. Skin: warm and dry, edema in sacral area unchanged from yesterday's exam.  Still very sensitive to touch Neuro: alert and oriented   Laboratory: Recent Labs  Lab 12/28/18 0622 12/29/18 0454 12/30/18 0358  WBC 39.3* 41.6*  42.4* 49.4*  HGB 8.5* 9.2*  9.2* 8.1*  HCT 27.6* 29.9*  30.0* 26.5*  PLT 459* 490*  499* 564*   Recent Labs  Lab 12/25/18 1115 12/28/18 0622 12/29/18 0454 12/30/18 0358  NA 138 132* 132* 129*  K 4.2 3.5 3.7 3.1*  CL 99 96* 94* 91*  CO2 22 23 24 23   BUN 55* 47* 46* 48*  CREATININE 11.09* 8.65* 8.66* 9.18*  CALCIUM 8.8* 8.1* 8.2* 8.4*  PROT 7.2  --   --   --   BILITOT 0.8  --   --   --   ALKPHOS 122  --   --   --   ALT 15  --   --   --   AST 14*  --   --   --  GLUCOSE 140* 162* 204* 144*   Lactic acid-1.6  Imaging/Diagnostic Tests: No results found.  Gifford Shave, MD 12/30/2018, 8:16 AM PGY-1, [place] Intern pager: [phone], text pages welcome

## 2018-12-30 NOTE — Progress Notes (Signed)
Patient ID: Susan Horton, female   DOB: Oct 20, 1970, 48 y.o.   MRN: [id number]          [facility]    Date of Admission:  12/25/2018   Total days of antibiotics 6        Day 2 linezolid  Ms. Fifer has diabetes and end-stage renal disease on peritoneal dialysis.  She was admitted 6 days ago after a fall with left buttock pain.  CT scan showed a fluid collection that was aspirated.  General surgery felt that this represented hematoma rather than an abscess.  Her white count has continued to climb up.  She was found to have peritonitis with 333 white blood cells of which 95% were neutrophils.  Peritoneal fluid Gram stain and cultures were negative.  Blood cultures were negative.  Empiric antibiotics were changed to linezolid yesterday.  I agree with the plan to follow-up pelvic MRI.  There is no clear evidence but it is infection again I would agree removal of her peritoneal dialysis catheter.         Michel Bickers, MD [facility] for [facility] [phone] pager   [phone] cell 12/30/2018, 4:51 PM

## 2018-12-31 ENCOUNTER — Inpatient Hospital Stay (HOSPITAL_COMMUNITY): Payer: Medicare Other

## 2018-12-31 LAB — BASIC METABOLIC PANEL
Anion gap: 15 (ref 5–15)
BUN: 51 mg/dL — ABNORMAL HIGH (ref 6–20)
CO2: 22 mmol/L (ref 22–32)
Calcium: 8.5 mg/dL — ABNORMAL LOW (ref 8.9–10.3)
Chloride: 90 mmol/L — ABNORMAL LOW (ref 98–111)
Creatinine, Ser: 9.1 mg/dL — ABNORMAL HIGH (ref 0.44–1.00)
GFR calc Af Amer: 5 mL/min — ABNORMAL LOW (ref >60)
GFR calc non Af Amer: 5 mL/min — ABNORMAL LOW (ref >60)
Glucose, Bld: 110 mg/dL — ABNORMAL HIGH (ref 70–99)
Potassium: 3.2 mmol/L — ABNORMAL LOW (ref 3.5–5.1)
Sodium: 127 mmol/L — ABNORMAL LOW (ref 135–145)

## 2018-12-31 LAB — CBC
HCT: 23.7 % — ABNORMAL LOW (ref 36.0–46.0)
Hemoglobin: 7.5 g/dL — ABNORMAL LOW (ref 12.0–15.0)
MCH: 26.7 pg (ref 26.0–34.0)
MCHC: 31.6 g/dL (ref 30.0–36.0)
MCV: 84.3 fL (ref 80.0–100.0)
Platelets: 518 10*3/uL — ABNORMAL HIGH (ref 150–400)
RBC: 2.81 MIL/uL — ABNORMAL LOW (ref 3.87–5.11)
RDW: 14.4 % (ref 11.5–15.5)
WBC: 47.8 10*3/uL — ABNORMAL HIGH (ref 4.0–10.5)
nRBC: 0 % (ref 0.0–0.2)

## 2018-12-31 LAB — GLUCOSE, CAPILLARY
Glucose-Capillary: 94 mg/dL (ref 70–99)
Glucose-Capillary: 117 mg/dL — ABNORMAL HIGH (ref 70–99)
Glucose-Capillary: 126 mg/dL — ABNORMAL HIGH (ref 70–99)

## 2018-12-31 LAB — CULTURE, BLOOD (ROUTINE X 2): Culture: NO GROWTH

## 2018-12-31 MED ORDER — SENNOSIDES-DOCUSATE SODIUM 8.6-50 MG PO TABS
1.0000 | ORAL_TABLET | Freq: Every day | ORAL | Status: DC
  Administered 2019-01-02 – 2019-01-03 (×2): 1 via ORAL
  Filled 2018-12-31 (×4): qty 1

## 2018-12-31 MED ORDER — FLUCONAZOLE IN SODIUM CHLORIDE 400-0.9 MG/200ML-% IV SOLN
400.0000 mg | INTRAVENOUS | Status: AC
  Administered 2018-12-31: 400 mg via INTRAVENOUS
  Filled 2018-12-31: qty 200

## 2018-12-31 MED ORDER — DARBEPOETIN ALFA 100 MCG/0.5ML IJ SOSY
100.0000 ug | PREFILLED_SYRINGE | INTRAMUSCULAR | Status: DC
  Administered 2019-01-03: 100 ug via SUBCUTANEOUS
  Filled 2018-12-31: qty 0.5

## 2018-12-31 NOTE — Progress Notes (Signed)
Spoke to [facility] S[facility] physician Dr Brantley Stage, I explained the recommendations per ID regarding repeat aspiration and specimen sending for Gram stain, aerobic and anaerobic cultures. Surgery will see patient tomorrow.  Lattie Haw PGY-1, [place]

## 2018-12-31 NOTE — Progress Notes (Signed)
Pharmacy Antibiotic Note  Susan Horton is a 48 y.o. female admitted on 12/25/2018 with Infected hematoma, gluteal abscess, sacral phlegmon,  Possibly peritonitis.  Pharmacy has been consulted for empiric Fluconazole dosing.  ID: Infected hematoma, gluteal abscess, sacral phlegmon,  Possibly peritonitis. - WBC 47.8 still up, afeb, LA 1.6 (12/14). ID following. Cultures remain negative.  Remove PD catheter? ID following. Start empiric fluconazole.   - 12/17 bedside I&D 64ml old blood, no pus ; 12/17 PCN test neg  - 12/20 MRI: possible osteo.  Amp/sulb 12/17 >> 12/18 MTZ 12/14>>12/18 Aztreonam 12/14>>12/17 Vanc 12/14>>12/17 Zyvox 12/18>> Fluconazole 12/20>>  12/14 Covid neg 12/15: PD fluid>> negative 12/14 Bcx: >>negative 12/15: BC x 2>>  Plan: Fluconazole 400mg  IV x 1, then 200mg  daily    Height: 5\' 5"  (165.1 cm) Weight: 174 lb 9.7 oz (79.2 kg) IBW/kg (Calculated) : 57  Temp (24hrs), Avg:98.7 F (37.1 C), Min:98.4 F (36.9 C), Max:99.1 F (37.3 C)  Recent Labs  Lab 12/25/18 1300 12/27/18 0842 12/28/18 0622 12/29/18 0454 12/30/18 0358 12/31/18 0642  WBC  --  38.9* 39.3* 41.6*  42.4* 49.4* 47.8*  CREATININE  --  10.20* 8.65* 8.66* 9.18* 9.10*  LATICACIDVEN 1.6  --   --   --   --   --     Estimated Creatinine Clearance: 7.9 mL/min (A) (by C-G formula based on SCr of 9.1 mg/dL (H)).    Allergies  Allergen Reactions  . Repatha [Evolocumab] Itching  . Lisinopril Cough   Susan Horton S. Alford Highland, PharmD, BCPS Clinical Staff Pharmacist [email or web address]  Wayland Salinas 12/31/2018 3:06 PM

## 2018-12-31 NOTE — Progress Notes (Signed)
Followed up with MRI on when they'll take the pt down, said that they're not able to do it last night because they're short of staff and have to prioritized ED STAT orders. Called one of there Tech to come early this AM to help, and pt will be the 1st one that he will take down for MRI per staff.

## 2018-12-31 NOTE — Progress Notes (Addendum)
[facility] Progress Note   Subjective:   Patient seen and examined at bedside.  Feels no better or worse.  Continues to have pain in her buttocks.  Did not have PD last night because she was waiting to go to MRI.  MRI completed this AM shows coccygeal and peri sacral phlegmon and possible osteomyelitis.   Objective Vitals:   12/30/18 2144 12/31/18 0454 12/31/18 0500 12/31/18 1418  BP: (!) 128/42 (!) 122/48  (!) 121/51  Pulse: 85 86  87  Resp: 18 15    Temp: 99.1 F (37.3 C) 98.7 F (37.1 C)  98.4 F (36.9 C)  TempSrc:  Oral  Oral  SpO2: 99% 100%  100%  Weight:   79.2 kg   Height:       Physical Exam General:NAD, WDWN  Heart:RRR Lungs:CTAB Abdomen:soft,mildly distended Extremities:no LE edema Dialysis Access: PD cath, LU AVF   Filed Weights   12/30/18 0630 12/30/18 1418 12/31/18 0500  Weight: 78.9 kg 78.5 kg 79.2 kg    Intake/Output Summary (Last 24 hours) at 12/31/2018 1615 Last data filed at 12/31/2018 0500 Gross per 24 hour  Intake 460 ml  Output --  Net 460 ml    Additional Objective Labs: Basic Metabolic Panel: Recent Labs  Lab 12/26/18 1047 12/29/18 0454 12/30/18 0358 12/31/18 0642  NA 134* 132* 129* 127*  K 4.5 3.7 3.1* 3.2*  CL 101 94* 91* 90*  CO2 18* 24 23 22   GLUCOSE 217* 204* 144* 110*  BUN 59* 46* 48* 51*  CREATININE 11.09* 8.66* 9.18* 9.10*  CALCIUM 8.5* 8.2* 8.4* 8.5*  PHOS 5.7*  --   --   --    Liver Function Tests: Recent Labs  Lab 12/25/18 1115 12/26/18 1047  AST 14*  --   ALT 15  --   ALKPHOS 122  --   BILITOT 0.8  --   PROT 7.2  --   ALBUMIN 2.4* 2.0*   Recent Labs  Lab 12/25/18 1115  LIPASE 15   CBC: Recent Labs  Lab 12/26/18 1047 12/27/18 0842 12/28/18 0622 12/29/18 0454 12/30/18 0358 12/31/18 0642  WBC 33.2* 38.9* 39.3* 41.6*  42.4* 49.4* 47.8*  NEUTROABS 30.2*  --   --  36.3*  --   --   HGB 8.9* 8.7* 8.5* 9.2*  9.2* 8.1* 7.5*  HCT 28.8* 28.1* 27.6* 29.9*  30.0* 26.5* 23.7*  MCV 85.0  84.6 85.2 86.2  86.2 86.3 84.3  PLT 425* 432* 459* 490*  499* 564* 518*   Blood Culture    Component Value Date/Time   SDES PERITONEAL DIALYSIS 12/26/2018 1503   SPECREQUEST NONE 12/26/2018 1503   CULT  12/26/2018 1503    NO GROWTH 3 DAYS Performed at [facility], [place] [address] [place], [place] [address]    REPTSTATUS 12/30/2018 FINAL 12/26/2018 1503   CBG: Recent Labs  Lab 12/30/18 1231 12/30/18 1708 12/30/18 2139 12/31/18 0746 12/31/18 1559  GLUCAP 168* 94 113* 94 117*    Lab Results  Component Value Date   INR 0.91 02/01/2017   INR 0.9 07/26/2016   INR 1.07 10/23/2014   Studies/Results: MR PELVIS WO CONTRAST  Result Date: 12/31/2018 CLINICAL DATA:  History of abscess about the inner rectal region. EXAM: MRI PELVIS WITHOUT CONTRAST TECHNIQUE: Multiplanar multisequence MR imaging of the pelvis was performed. No intravenous contrast was administered. COMPARISON:  CT study of 12/25/2018 and 12/27/2018 FINDINGS: Urinary Tract: Urinary bladder is moderately distended without signs of hydronephrosis of the  distal ureters. Bowel: Visualized bowel is unremarkable aside from some mild thickening of the distal rectum. Assessment is limited on today's study. Vascular/Lymphatic: Is vascular structures not well assessed given lack of intravenous contrast. Appropriate flow voids are present in the bilateral pelvic vasculature. Reproductive:  Post hysterectomy. Other:  Is diffuse body wall edema. Musculoskeletal: Perisacral and coccygeal fluid collection with complex features measuring 5.2 x 3.8 x 8.9 cm. Edema extends to the levator plate as does fluid. Perirectal edema is also noted on today's study. There is no clear evidence of rectal involvement aside from the edema this changes. Stranding also extends to the posterior margin of the puborectalis muscle and the sphincter complex though the fluid collection does not extend to this level. Mild increased T2 signal in the sacrum and  coccyx. Signs of low T2 signal throughout visualized bony structures aside from the last sacral segment in the coccyx. Is Assessment overall is limited due to patient position and discomfort with inability to Ree acquire limited sequences Fluid in stranding extends into the gluteal cleft left greater than right. High signal also involves the inferior margin of the sphincter complex on the left greater than right. IMPRESSION: 1. Peri sacral and coccygeal fluid collection with stranding extending into the levator plate and sphincter complex. No clear evidence of rectal involvement. Inflammatory changes extend into the left gluteal cleft and also involve the inferior margin of the anal sphincter complex. Anoscopy and or sigmoidoscopy may be helpful for further assessment given above findings. 2. Mild increased T2 signal in the sacrum and coccyx. Given recent history of trauma this is nonspecific, osteomyelitis or posttraumatic changes are considered. Given proximity of fluid osteomyelitis is favored at this time. 3. Diffuse body wall edema. 4. Post hysterectomy. Electronically Signed   By: Zetta Bills M.D.   On: 12/31/2018 09:49    Medications: . dialysis solution 1.5% low-MG/low-CA    . fluconazole (DIFLUCAN) IV 400 mg (12/31/18 1515)  . linezolid (ZYVOX) IV 600 mg (12/31/18 1014)   . acetaminophen  650 mg Oral Q6H  . amitriptyline  10 mg Oral QHS  . aspirin EC  81 mg Oral Daily  . calcitRIOL  1.5 mcg Oral Daily  . darbepoetin (ARANESP) injection - DIALYSIS  60 mcg Subcutaneous Q Wed-1800  . gentamicin cream  1 application Topical Daily  . heparin  5,000 Units Subcutaneous Q8H  . insulin aspart  0-6 Units Subcutaneous TID WC  . insulin glargine  10 Units Subcutaneous QHS  . metoCLOPramide  5 mg Oral TID AC & HS  . metoprolol succinate  50 mg Oral Daily  . mometasone-formoterol  2 puff Inhalation BID  . oxyCODONE  5 mg Oral Q6H  . pantoprazole  40 mg Oral Daily  . pravastatin  20 mg Oral q1800   . pregabalin  75 mg Oral Daily  . [START ON 01/01/2019] senna-docusate  1 tablet Oral Daily    Dialysis Orders: CCPD - 1 2.5 5 L bag and 1 1.5 5 L bag - 5 fills - 2 L fills with 1 L in last fill dwell 1.5 hr  Assessment/Plan: 1. Gluteal abscess -Surgery aspirated sacral fluid 12/17 which revealed only old blood. ID consulting. MRI showed possible osteomyelitis.  ID consulting.  2. Leukocytosis - WBC 25 >33 K >38.9>41.6>49.4>47.8. Gluteal abscess vs leukocytosis.   ID consulting.  On linezolid. MRI buttocks, if negative and no response to linezolid, they would consider removing PD catheter.  Likely due to OM.  2. Gastroparesis -  chronic problem -on chronic Reglan 5 mg. At baseline per patient. 3.Peritonitis - cell count 333 - with 95% neutrophils - culture sentwith no organisms on gram stain. No abdominal pain. Per radiology eval of 12/14 CT scan there is no communication of the abscess with the peritoneum  4. ESRD- on PD- continue 1.5% solution. Has an AVF so could easily transition to HD if she had surgery for abscess that might warrant a holiday from PD or if PD catheter had to be removed.No communication with peritoneum on 12/14 scan per radiology.PD cath clotting, improved with activase.  Using heparinized solution.  5. Anemia- hgb 10.3 >8.7>9.2>7.5hold oral Fe to lessen pill burden with gastroparesis - increase Aranesp 154mcg qwk - has had in the past here - gets Mircera as outpatient (last dose in Sept) 6. Secondary hyperparathyroidism- Ca 8.5 on 3 Ca acetate ac - was heldto lessen pill burdenand with nausea- resume at d/c - continue calcitriol. Recheck phos 7.HTN/volume- BP variable-well controlled today. Does not appear volume overloaded. On Toprol 8 . DM- per primary  Jen Mow, PA-C [facility] Pager: [phone] 12/31/2018,4:15 PM  LOS: 5 days   I have seen and examined this patient and agree with plan and assessment in the  above note with renal recommendations/intervention highlighted.  She was seen on 12/31/18 and continued to complain of gluteal pain.  Did not perform CCPD the evening of the 19th because she didn't want to start and then come off to go to MRI. Governor Rooks Amrom Ore,MD 01/01/2019 9:39 AM

## 2018-12-31 NOTE — Progress Notes (Signed)
Family Medicine Teaching Service Daily Progress Note Intern Pager: [phone]  Patient name: Susan Horton Medical record number: [id number] Date of birth: November 01, 1970 Age: 48 y.o. Gender: female  Primary Care Provider: Charlott Rakes, MD Consultants: Surgery  Code Status: Full   Pt Overview and Major Events to Date:  Admitted to [facility] on 12/25/18 Aspiration per Gen Sx on 12/28/18  Assessment and Plan: Susan L Jonesis a 48 y.o.femalepresenting with sacral phlegmon. PMH is significant forESRD on home PD, T2DM, HTN, CHF,PVD.  Sacral phlegmon Continues to report increased pain.  Does report that IV Dilaudid helps the pain but does wear off.  White blood cells improving to 47.8, down from 49.4 on 12/19.  MRI obtained this a.m. showing fluid collection with stranding extending to the levator plate and sphincter complex.  No evidence of rectal involvement.  Anoscopy and sigmoidoscopy recommended as it may be helpful for further assessment.  There is also some concern of osteomyelitis versus posttraumatic changes of the sacrum and coccyx.  Will defer further recommendations to infectious disease and general surgery who are both following along this patient.  If infectious disease desires we will be happy to consult GI. -continue abx: linezolid (12/18-) -ID consulted, appreciate recommendations -General surgery consulted, appreciate recommendations -Pain control: scheduled tylenol, scheduled oxy IR, PRN dilaudid or oxycodone  -Vitals per routine  Constipation Reports 1 BM. If no further BM can consider mineral oil enema. Would avoid fleet enema in ESRD patient.  -Fleet enema -schedule senna daily   ESRD on home PD Home medications include PhosLo, calcitriol.  Missed PD yesterday in hopes of getting MRI.   -Nephrology consulted, appreciate recommendations  CAD Follows with [facility].Patient has history of CAD and is on aspirin, Plavix and metoprolol. Patient had  drug-eluting stent placed in 2018. -Restart home aspirin and Plavix -Continue home metoprolol  PAD Medications include Plavix, aspirin, lovastatin 20 mg daily -Holding home Plavix, restart pending need for interventions  -continue ASA  -pravastatin 20 mg due to it being formulary in the hospital.  HFpEF Echo from 2019 showed EF of 55 to 60% with G2DD.Home medications as metoprolol XL -Monitor signs and symptoms of heart failure -Continue home metoprolol XL dose  T2 DMwith peripheral neuropathy Patient's home medications include Toujeo 25 units twice dailyand humulin SSI.AM CBG 94. No SSI needed today  -Holding home Toujeo -Sensitive sliding scale -10 units Lantus daily -Continue home Lyrica and amitriptyline -CBGs with meals  Gastroparesis Patient has history of gastroparesis for which she takes metoclopramide 5 mg at home. -Continue home metoclopramide  HTN Patient's home medications include Toprol-XL 50 mg daily -Continue home medications  Asthma Home medications include SingulairPRNat bedtime, Advair twice daily and Ventolin as needed. -Continue home asthma medications -Holding Singulair    FEN/GI: Renal/carb modified PPx: SCDs  Disposition: pending ID and gen sx recommendations   Subjective:  She states she continues to have increased pain.  Does report that Dilaudid helps but does wear off.  Is willing to continue scheduled oxycodone.  Patient is worried about extended course of antibiotics but is willing to do this if this is what she needs.  Objective: Temp:  [98 F (36.7 C)-99.1 F (37.3 C)] 98.7 F (37.1 C) (12/20 0454) Pulse Rate:  [80-86] 86 (12/20 0454) Resp:  [15-18] 15 (12/20 0454) BP: (111-128)/(39-52) 122/48 (12/20 0454) SpO2:  [98 %-100 %] 100 % (12/20 0454) Weight:  [78.5 kg-79.2 kg] 79.2 kg (12/20 0500) Physical Exam: General: awake and alert, laying on  her R side  Cardiovascular: RRR, no MRG  Respiratory: CTAB, no wheezes,  rales or rhonchi Extremities: no edema   Laboratory: Recent Labs  Lab 12/29/18 0454 12/30/18 0358 12/31/18 0642  WBC 41.6*  42.4* 49.4* 47.8*  HGB 9.2*  9.2* 8.1* 7.5*  HCT 29.9*  30.0* 26.5* 23.7*  PLT 490*  499* 564* 518*   Recent Labs  Lab 12/25/18 1115 12/29/18 0454 12/30/18 0358 12/31/18 0642  NA 138 132* 129* 127*  K 4.2 3.7 3.1* 3.2*  CL 99 94* 91* 90*  CO2 22 24 23 22   BUN 55* 46* 48* 51*  CREATININE 11.09* 8.66* 9.18* 9.10*  CALCIUM 8.8* 8.2* 8.4* 8.5*  PROT 7.2  --   --   --   BILITOT 0.8  --   --   --   ALKPHOS 122  --   --   --   ALT 15  --   --   --   AST 14*  --   --   --   GLUCOSE 140* 204* 144* 110*     Imaging/Diagnostic Tests: CT Abdomen Pelvis Wo Contrast  Addendum Date: 12/27/2018   ADDENDUM REPORT: 12/27/2018 15:53 ADDENDUM: Case was discussed with Dr. Royce Macadamia of nephrology. 12/27/2018 at 3:50 pm Inflammation and phlegmonous change is centered in the posterior gluteal cleft and ischioanal soft tissues with possible extension into the intersphincteric plane, which is in extraperitoneal space. No clearly discernible extension into the peritoneal space is seen on these images within the limitations of CT soft tissue resolution in the pelvis. Additionally, the increased stranding in both kidneys was discussed as well. This finding is nonspecific and could reflect worsening renal function superimposed urinary tract infection could have a similar appearance and could be excluded with urinalysis if patient is able to produce urine. Electronically Signed   By: Lovena Le M.D.   On: 12/27/2018 15:53   Result Date: 12/27/2018 CLINICAL DATA:  Lower back pain radiating to buttocks, hard knot in the sacral region. EXAM: CT ABDOMEN AND PELVIS WITHOUT CONTRAST TECHNIQUE: Multidetector CT imaging of the abdomen and pelvis was performed following the standard protocol without IV contrast. Patient unable to tolerate supine scanning, images obtained in a decubitus  positioning. COMPARISON:  CT abdomen pelvis 03/17/2017 FINDINGS: Lower chest: Lung bases are clear. Normal heart size. No pericardial effusion. Hepatobiliary: No focal liver abnormality is seen. Patient is post cholecystectomy. Slight prominence of the biliary tree likely related to reservoir effect. No calcified intraductal gallstones. Pancreas: Unremarkable. No pancreatic ductal dilatation or surrounding inflammatory changes. Spleen: Normal in size without focal abnormality. Adrenals/Urinary Tract: Normal adrenal glands. Increasing nonspecific bilateral perinephric stranding. No visible or concerning renal lesions. No urolithiasis or hydronephrosis. Urinary bladder is unremarkable. Stomach/Bowel: Distal esophagus, stomach and duodenal sweep are unremarkable. No small bowel wall thickening or dilatation. No evidence of obstruction. The appendix is surgically absent. No colonic dilatation or wall thickening. Minimal rectal wall thickening. Vascular/Lymphatic: Atherosclerotic plaque within the normal caliber aorta. No suspicious or enlarged lymph nodes in the included lymphatic chains. Reproductive: Uterus is surgically absent. No concerning adnexal lesions. Suspect retained ovarian tissue. Other: There is extensive soft tissue stranding and phlegmonous change in the superior gluteal cleft extending to the tip of the sacrum and towards the mesorectal fat within ill-defined possible fluid collection along the left lateral aspect of this inflammation measuring approximately 2 x 1 x 1 cm in size. Inflammatory change extends to the levator plate with some hazy intersphincteric stranding which could  suggest some transsphincteric extension. No soft tissue gas is seen. No free fluid or air in the abdomen or pelvis. Peritoneal catheter terminates in the left lower quadrant. Musculoskeletal: No features of subjacent osteomyelitis of the sacrum or coccyx. No acute osseous abnormality or suspicious osseous lesion. Mild  levocurvature of the spine is likely positional. IMPRESSION: 1. Extensive stranding and phlegmonous change in the superior gluteal cleft and ischioanal fossa extending to the tip of the sacrum and towards the levator plate within ill-defined possible fluid collection along the left lateral aspect of this measuring approximately 2 x 1 x 1 cm in size possibly reflecting developing abscess. Inflammatory change extends to the levator plate with some hazy intersphincteric stranding which could suggest some transsphincteric extension. No soft tissue gas is seen. No features of subjacent osteomyelitis of the sacrum or coccyx. 2. Increasing bilateral nonspecific perinephric stranding. Could be related to patient's decreased renal function though could consider urinalysis to exclude superimposed urinary tract infection. 3. Peritoneal catheter, likely peritoneal dialysis catheter terminating in the left lower quadrant. 4.  Aortic Atherosclerosis (ICD10-I70.0). Electronically Signed: By: Lovena Le M.D. On: 12/25/2018 16:15   DG Sacrum/Coccyx  Result Date: 12/23/2018 CLINICAL DATA:  Pt in with tailbone pain s/p fall 4 days ago. Able to ambulate, states it hurst to sit as there is a knot to her L buttocks EXAM: SACRUM AND COCCYX - 2+ VIEW COMPARISON:  None. FINDINGS: There is no evidence of fracture or other focal bone lesions. IMPRESSION: Negative. Electronically Signed   By: Lajean Manes M.D.   On: 12/23/2018 17:10   MR PELVIS WO CONTRAST  Result Date: 12/31/2018 CLINICAL DATA:  History of abscess about the inner rectal region. EXAM: MRI PELVIS WITHOUT CONTRAST TECHNIQUE: Multiplanar multisequence MR imaging of the pelvis was performed. No intravenous contrast was administered. COMPARISON:  CT study of 12/25/2018 and 12/27/2018 FINDINGS: Urinary Tract: Urinary bladder is moderately distended without signs of hydronephrosis of the distal ureters. Bowel: Visualized bowel is unremarkable aside from some mild  thickening of the distal rectum. Assessment is limited on today's study. Vascular/Lymphatic: Is vascular structures not well assessed given lack of intravenous contrast. Appropriate flow voids are present in the bilateral pelvic vasculature. Reproductive:  Post hysterectomy. Other:  Is diffuse body wall edema. Musculoskeletal: Perisacral and coccygeal fluid collection with complex features measuring 5.2 x 3.8 x 8.9 cm. Edema extends to the levator plate as does fluid. Perirectal edema is also noted on today's study. There is no clear evidence of rectal involvement aside from the edema this changes. Stranding also extends to the posterior margin of the puborectalis muscle and the sphincter complex though the fluid collection does not extend to this level. Mild increased T2 signal in the sacrum and coccyx. Signs of low T2 signal throughout visualized bony structures aside from the last sacral segment in the coccyx. Is Assessment overall is limited due to patient position and discomfort with inability to Ree acquire limited sequences Fluid in stranding extends into the gluteal cleft left greater than right. High signal also involves the inferior margin of the sphincter complex on the left greater than right. IMPRESSION: 1. Peri sacral and coccygeal fluid collection with stranding extending into the levator plate and sphincter complex. No clear evidence of rectal involvement. Inflammatory changes extend into the left gluteal cleft and also involve the inferior margin of the anal sphincter complex. Anoscopy and or sigmoidoscopy may be helpful for further assessment given above findings. 2. Mild increased T2 signal in the sacrum  and coccyx. Given recent history of trauma this is nonspecific, osteomyelitis or posttraumatic changes are considered. Given proximity of fluid osteomyelitis is favored at this time. 3. Diffuse body wall edema. 4. Post hysterectomy. Electronically Signed   By: Zetta Bills M.D.   On: 12/31/2018  09:49   CT ABDOMEN PELVIS W CONTRAST  Result Date: 12/27/2018 CLINICAL DATA:  Anal/rectal abscess EXAM: CT ABDOMEN AND PELVIS WITH CONTRAST TECHNIQUE: Multidetector CT imaging of the abdomen and pelvis was performed using the standard protocol following bolus administration of intravenous contrast. CONTRAST:  146mL OMNIPAQUE IOHEXOL 300 MG/ML  SOLN COMPARISON:  12/25/2018 FINDINGS: Lower chest: No acute abnormality Hepatobiliary: No focal liver abnormality is seen. Status post cholecystectomy. No biliary dilatation. Pancreas: No focal abnormality or ductal dilatation. Spleen: No focal abnormality.  Normal size. Adrenals/Urinary Tract: No adrenal abnormality. No focal renal abnormality. No stones or hydronephrosis. Urinary bladder is unremarkable. Stomach/Bowel: Stomach, large and small bowel grossly unremarkable. Vascular/Lymphatic: Aortoiliac atherosclerosis. No aneurysm or adenopathy. Reproductive: Prior hysterectomy.  No adnexal masses. Other: No free fluid or free air. Again noted is inflammation/stranding in the superior gluteal cleft. Enlarging subcutaneous fluid collection noted in the left medial buttock measuring 2.9 x 1.9 cm on image 92 of series 5. Musculoskeletal: No acute bony abnormality. IMPRESSION: Continued stranding/inflammation in the gluteal subcutaneous soft tissues at the superior gluteal cleft with enlarging fluid collection in the medial left buttock subcutaneous soft tissues measuring 2.9 x 1.9 cm. Aortoiliac atherosclerosis. Prior cholecystectomy and hysterectomy. Electronically Signed   By: Rolm Baptise M.D.   On: 12/27/2018 17:47   PERIPHERAL VASCULAR CATHETERIZATION  Result Date: 12/14/2018  Patient name: GENEVIENE TESCH MRN: [id number]        DOB: 12-05-1970            Sex: female  12/14/2018 Pre-operative Diagnosis: Bilateral lower extremity lifestyle limiting short distance claudication Post-operative diagnosis:  Same Surgeon:  [name], MD Procedure Performed: 1.   Ultrasound-guided access of the right common femoral artery 2.  Aortogram with bilateral lower extremity arteriogram 3.  Left SFA angioplasty (4 mm x 150 mm Mustang) 4.  Left SFA stent placement (5 mm x 150 mm self-expanding Innova) 5.  49 minutes of monitored moderate conscious sedation time 6.  Mynx closure of the right common femoral artery  Indications: Patient is a 48 year old female that was seen in clinic as a referral from nephrology for evaluation of lower extremity arterial disease.  Ultimately her symptoms were consistent with claudication progressing to short distance lifestyle limiting claudication and noninvasive imaging showed evidence of SFA disease with monophasic runoff at the ankles in both lower extremities.  Attempted to recommend conservative management initially but patient stated that her symptoms are progressing.  She presents today for left lower extremity intervention after risk and benefits were discussed.  Findings:  Aortogram showed a fairly small infrarenal aorta that was moderately diseased but no overt flow-limiting stenosis.  The right common and external iliac are widely patent although small.  The left common iliac is widely patent and the left external iliac has a proximal 50% stenosis.  I elected not to treat the left iliac given excellent left femoral pulse.  The left lower extremity arteriogram showed a patent common femoral and profunda.  She has a fairly diseased SFA in the mid segment with approximately 150 mm segment with greater than 90 to 95% stenosis.  Patient had a patent above and below-knee popliteal artery.  She has dominant runoff in the left  lower extremity via the posterior tibial into the ankle.  The anterior tibial and peroneal were patent proximal but appeared to occlude in the distal calf.  There was reconstitution of a very diminutive dorsalis pedis.  Right lower extremity arteriogram shows a patent common femoral and profunda and again a fairly long  diseased segment of SFA with flow-limiting stenosis.  She has a patent above and below-knee popliteal artery.  Dominant runoff in the right lower extremity appears to be the posterior tibial.             Procedure:  The patient was identified in the holding area and taken to room 8.  The patient was then placed supine on the table and prepped and draped in the usual sterile fashion.  A time out was called.  Ultrasound was used to evaluate the right common femoral artery.  It was patent .  A digital ultrasound image was acquired.  A micropuncture needle was used to access the right common femoral artery under ultrasound guidance.  An 018 wire was advanced without resistance and a micropuncture sheath was placed.  The 018 wire was removed and a benson wire was placed.  The micropuncture sheath was exchanged for a 5 french sheath.  An omniflush catheter was advanced over the wire to the level of L-1.  An abdominal angiogram was obtained.  Next the catheter was pulled down and bilateral lower extremity runoff was obtained.  Pertinent findings are noted above.  Given plans to intervene on the left leg first since it was more symptomatic, I used a Glidewire advantage to cross the aortic bifurcation with a Omni Flush catheter and selected the left SFA.  I then upsized to a long 6 [place] Ansell sheath in the right common femoral artery over the aortic bifurcation and the distal sheath was parked in the left SFA.  Patient was given 100 units/kg heparin.  I then used a Glidewire advantage with a quick cross catheter to cross the SFA lesion into the above-knee popliteal artery.  We did perform hand-injection through the quick cross to confirm we were in the true lumen.  We got additional planning shot from the sheath.  Ultimately selected a 4 mm x 150 mm Mustang to angioplasty the mid SFA lesion throughout its segment.  The balloon was inflated to nominal pressure for 2 minutes.  We shot another arteriogram through the sheath  and there was a dissection.  I felt like this had to be stented given the extent of dissection and did not feel like a drug-coated balloon would be acceptable.  I did select a 5 mm x 150 mm self-expanding Innova.  This was placed across the SFA diseased segment where we had angioplastied.  Once the stent was deployed I then used a 4 mm Mustang and overinflated this to 24 mmHg to get full expansion of the stent.  One final injection showed inline flow down the SFA with no residual stenosis.  There was preserved runoff in the lower extremity with patent trifurcation and dominant runoff into the foot via posterior tibial artery.  That point time wires and catheters were removed and exchanged for a short 6 French sheath in the right groin.  Mynx closure device was deployed and additional pressure was held for 5 minutes.  Patient was taken to PACU in stable condition.     [name], MD [facility] Office: [phone] Pager: [place]  Result Date: 12/21/2018  Nuclear stress EF: 44%. The left ventricular ejection fraction is moderately decreased (30-44%).  There was no ST segment deviation noted during stress.  This is a low risk study. There is no evidence of ischemia or previous myocardial infarction.  The study is normal.    ECHOCARDIOGRAM COMPLETE  Result Date: 12/22/2018   ECHOCARDIOGRAM REPORT   Patient Name:   Susan Horton Date of Exam: 12/22/2018 Medical Rec #:  [id number]     Height:       65.0 in Accession #:    [id number]    Weight:       163.0 lb Date of Birth:  [birth year]      BSA:          1.81 m Patient Age:    29 years      BP:           142/68 mmHg Patient Gender: F             HR:           114 bpm. Exam Location:  [facility] Procedure: 2D Echo, Cardiac Doppler and Color Doppler Indications:    R06.02 SOB; I40.9 Acute myocarditis  History:        Patient has prior history of  Echocardiogram examinations, most                 recent 06/19/2017. CHF, CAD, Signs/Symptoms:Chest Pain; Risk                 Factors:Hypertension, Diabetes and Dyslipidemia. Chronic kidney                 disease. End stage renal disease. PVD.  Sonographer:    Diamond Nickel RCS Referring Phys: [place]  1. Left ventricular ejection fraction, by visual estimation, is 60 to 65%. The left ventricle has normal function. There is mildly increased left ventricular hypertrophy.  2. The left ventricle has no regional wall motion abnormalities.  3. Global right ventricle has normal systolic function.The right ventricular size is normal. No increase in right ventricular wall thickness.  4. Left atrial size was normal.  5. Right atrial size was normal.  6. The mitral valve is normal in structure. Trivial mitral valve regurgitation. No evidence of mitral stenosis.  7. The tricuspid valve is normal in structure. Tricuspid valve regurgitation is mild.  8. The aortic valve is tricuspid. Aortic valve regurgitation is trivial. Mild aortic valve sclerosis without stenosis.  9. The pulmonic valve was grossly normal. Pulmonic valve regurgitation is trivial. 10. The inferior vena cava is normal in size with greater than 50% respiratory variability, suggesting right atrial pressure of 3 mmHg. FINDINGS  Left Ventricle: Left ventricular ejection fraction, by visual estimation, is 60 to 65%. The left ventricle has normal function. The left ventricle has no regional wall motion abnormalities. There is mildly increased left ventricular hypertrophy. Normal left atrial pressure. Right Ventricle: The right ventricular size is normal. No increase in right ventricular wall thickness. Global RV systolic function is has normal systolic function. Left Atrium: Left atrial size was normal in size. Right Atrium: Right atrial size was normal in size Pericardium: There is no evidence of pericardial effusion. Mitral Valve: The  mitral valve is normal in structure. Trivial mitral valve regurgitation. No evidence of mitral valve stenosis by observation. Tricuspid Valve: The tricuspid valve is normal in structure. Tricuspid valve regurgitation is mild. Aortic Valve: The aortic valve is tricuspid. Aortic  valve regurgitation is trivial. Mild aortic valve sclerosis is present, with no evidence of aortic valve stenosis. Pulmonic Valve: The pulmonic valve was grossly normal. Pulmonic valve regurgitation is trivial. Pulmonic regurgitation is trivial. Aorta: The aortic root, ascending aorta and aortic arch are all structurally normal, with no evidence of dilitation or obstruction. Venous: The inferior vena cava is normal in size with greater than 50% respiratory variability, suggesting right atrial pressure of 3 mmHg. IAS/Shunts: No atrial level shunt detected by color flow Doppler. There is no evidence of a patent foramen ovale. No ventricular septal defect is seen or detected. There is no evidence of an atrial septal defect.  LEFT VENTRICLE PLAX 2D LVIDd:         3.70 cm LVIDs:         2.60 cm LV PW:         1.60 cm LV IVS:        1.30 cm LVOT diam:     2.05 cm LV SV:         34 ml LV SV Index:   18.04 LVOT Area:     3.30 cm  RIGHT VENTRICLE RV S prime:     10.90 cm/s TAPSE (M-mode): 2.4 cm LEFT ATRIUM             Index       RIGHT ATRIUM          Index LA diam:        3.10 cm 1.71 cm/m  RA Area:     9.84 cm LA Vol (A2C):   37.6 ml 20.73 ml/m RA Volume:   20.60 ml 11.36 ml/m LA Vol (A4C):   33.4 ml 18.42 ml/m LA Biplane Vol: 36.8 ml 20.29 ml/m  AORTIC VALVE LVOT Vmax:   107.33 cm/s LVOT Vmean:  64.867 cm/s LVOT VTI:    0.185 m  AORTA Ao Root diam: 3.20 cm  SHUNTS Systemic VTI:  0.18 m Systemic Diam: 2.05 cm  Jenkins Rouge MD Electronically signed by Jenkins Rouge MD Signature Date/Time: 12/22/2018/4:51:37 PM    Final      Caroline More, DO 12/31/2018, 12:20 PM PGY-3, [place] Intern pager: [phone], text pages  welcome

## 2018-12-31 NOTE — Progress Notes (Signed)
MRI called that they're caught up with STAT orders and their might be a delay in doing pt's MRI tonight.

## 2018-12-31 NOTE — Progress Notes (Signed)
Patient ID: Susan Horton, female   DOB: 01/03/1971, 48 y.o.   MRN: [id number]         [facility] for Infectious Disease  Date of Admission:  12/25/2018   Total days of antibiotics 7        Day 3 linezolid        ASSESSMENT: Her MRI certainly increases concern for pelvic infection and possible osteomyelitis.  I recommend repeat aspiration with specimen sent for Gram stain, aerobic and anaerobic cultures.  She will probably need broader antibiotic therapy but I will continue linezolid for now.  PLAN: 1. Continue linezolid 2. Recommend repeat aspiration of perisacral fluid  Active Problems:   Gluteal abscess   Scheduled Meds: . acetaminophen  650 mg Oral Q6H  . amitriptyline  10 mg Oral QHS  . aspirin EC  81 mg Oral Daily  . calcitRIOL  1.5 mcg Oral Daily  . darbepoetin (ARANESP) injection - DIALYSIS  60 mcg Subcutaneous Q Wed-1800  . gentamicin cream  1 application Topical Daily  . heparin  5,000 Units Subcutaneous Q8H  . insulin aspart  0-6 Units Subcutaneous TID WC  . insulin glargine  10 Units Subcutaneous QHS  . metoCLOPramide  5 mg Oral TID AC & HS  . metoprolol succinate  50 mg Oral Daily  . mometasone-formoterol  2 puff Inhalation BID  . oxyCODONE  5 mg Oral Q6H  . pantoprazole  40 mg Oral Daily  . pravastatin  20 mg Oral q1800  . pregabalin  75 mg Oral Daily  . [START ON 01/01/2019] senna-docusate  1 tablet Oral Daily   Continuous Infusions: . dialysis solution 1.5% low-MG/low-CA    . fluconazole (DIFLUCAN) IV 400 mg (12/31/18 1515)  . linezolid (ZYVOX) IV 600 mg (12/31/18 1014)   PRN Meds:.albuterol, EPINEPHrine, dianeal solution for CAPD/CCPD with heparin, oxyCODONE **OR** HYDROmorphone (DILAUDID) injection   SUBJECTIVE: She is still having a great deal of buttock pain.  She denies any abdominal pain.  She has not noted any change in color of her dialysate.  Review of Systems: Review of Systems  Constitutional: Negative for fever.  Gastrointestinal:  Negative for abdominal pain.  Musculoskeletal:       Buttock/tailbone pain.    Allergies  Allergen Reactions  . Repatha [Evolocumab] Itching  . Lisinopril Cough    OBJECTIVE: Vitals:   12/30/18 2144 12/31/18 0454 12/31/18 0500 12/31/18 1418  BP: (!) 128/42 (!) 122/48  (!) 121/51  Pulse: 85 86  87  Resp: 18 15    Temp: 99.1 F (37.3 C) 98.7 F (37.1 C)  98.4 F (36.9 C)  TempSrc:  Oral  Oral  SpO2: 99% 100%  100%  Weight:   79.2 kg   Height:       Body mass index is 29.06 kg/m.  Physical Exam Constitutional:      Comments: She is resting quietly on her left side because of her buttock pain.  Abdominal:     Palpations: Abdomen is soft.     Tenderness: There is no abdominal tenderness.  Psychiatric:        Mood and Affect: Mood normal.    Pelvis MRI 12/31/2018  IMPRESSION: 1. Peri sacral and coccygeal fluid collection with stranding extending into the levator plate and sphincter complex. No clear evidence of rectal involvement. Inflammatory changes extend into the left gluteal cleft and also involve the inferior margin of the anal sphincter complex. Anoscopy and or sigmoidoscopy may be helpful for further assessment given above  findings. 2. Mild increased T2 signal in the sacrum and coccyx. Given recent history of trauma this is nonspecific, osteomyelitis or posttraumatic changes are considered. Given proximity of fluid osteomyelitis is favored at this time. 3. Diffuse body wall edema. 4. Post hysterectomy.    By: Zetta Bills M.D.    Lab Results Lab Results  Component Value Date   WBC 47.8 (H) 12/31/2018   HGB 7.5 (L) 12/31/2018   HCT 23.7 (L) 12/31/2018   MCV 84.3 12/31/2018   PLT 518 (H) 12/31/2018    Lab Results  Component Value Date   CREATININE 9.10 (H) 12/31/2018   BUN 51 (H) 12/31/2018   NA 127 (L) 12/31/2018   K 3.2 (L) 12/31/2018   CL 90 (L) 12/31/2018   CO2 22 12/31/2018    Lab Results  Component Value Date   ALT 15 12/25/2018    AST 14 (L) 12/25/2018   ALKPHOS 122 12/25/2018   BILITOT 0.8 12/25/2018     Microbiology: Recent Results (from the past 240 hour(s))  Culture, blood (routine x 2)     Status: None   Collection Time: 12/25/18 12:54 PM   Specimen: BLOOD  Result Value Ref Range Status   Specimen Description BLOOD RIGHT ANTECUBITAL  Final   Special Requests   Final    BOTTLES DRAWN AEROBIC AND ANAEROBIC Blood Culture adequate volume   Culture   Final    NO GROWTH 5 DAYS Performed at [facility], [address], [place], [place] [address]    Report Status 12/30/2018 FINAL  Final  SARS CORONAVIRUS 2 (TAT 6-24 HRS) Nasopharyngeal Nasopharyngeal Swab     Status: None   Collection Time: 12/25/18  5:24 PM   Specimen: Nasopharyngeal Swab  Result Value Ref Range Status   SARS Coronavirus 2 NEGATIVE NEGATIVE Final    Comment: (NOTE) SARS-CoV-2 target nucleic acids are NOT DETECTED. The SARS-CoV-2 RNA is generally detectable in upper and lower respiratory specimens during the acute phase of infection. Negative results do not preclude SARS-CoV-2 infection, do not rule out co-infections with other pathogens, and should not be used as the sole basis for treatment or other patient management decisions. Negative results must be combined with clinical observations, patient history, and epidemiological information. The expected result is Negative. Fact Sheet for Patients: SugarRoll.be Fact Sheet for Healthcare Providers: https://www.woods-mathews.com/ This test is not yet approved or cleared by the [place] FDA and  has been authorized for detection and/or diagnosis of SARS-CoV-2 by FDA under an Emergency Use Authorization (EUA). This EUA will remain  in effect (meaning this test can be used) for the duration of the COVID-19 declaration under Section 56 4(b)(1) of the Act, 21 U.S.C. section 360bbb-3(b)(1), unless the authorization is terminated or revoked  sooner. Performed at [facility], [place] [address], [place], [place] [address]   Culture, blood (Routine X 2) w Reflex to ID Panel     Status: None   Collection Time: 12/26/18  6:47 AM   Specimen: BLOOD RIGHT HAND  Result Value Ref Range Status   Specimen Description BLOOD RIGHT HAND  Final   Special Requests   Final    BOTTLES DRAWN AEROBIC ONLY Blood Culture results may not be optimal due to an inadequate volume of blood received in culture bottles   Culture   Final    NO GROWTH 5 DAYS Performed at [facility], [place] [address], [place], [place] [address]    Report Status 12/31/2018 FINAL  Final  Body fluid culture     Status: None   Collection Time: 12/26/18  3:03 PM   Specimen: Peritoneal Dialysis; Body Fluid  Result Value Ref Range Status   Specimen Description PERITONEAL DIALYSIS  Final   Special Requests NONE  Final   Gram Stain   Final    RARE WBC PRESENT, PREDOMINANTLY PMN NO ORGANISMS SEEN    Culture   Final    NO GROWTH 3 DAYS Performed at [facility] [address] [place], [place] [address]    Report Status 12/30/2018 FINAL  Final    Michel Bickers, MD [facility] [phone] pager   [phone] cell 12/31/2018, 4:25 PM

## 2019-01-01 ENCOUNTER — Inpatient Hospital Stay (HOSPITAL_COMMUNITY): Payer: Medicare Other

## 2019-01-01 DIAGNOSIS — D62 Acute posthemorrhagic anemia: Secondary | ICD-10-CM

## 2019-01-01 DIAGNOSIS — M8618 Other acute osteomyelitis, other site: Secondary | ICD-10-CM

## 2019-01-01 DIAGNOSIS — D72828 Other elevated white blood cell count: Secondary | ICD-10-CM

## 2019-01-01 DIAGNOSIS — R52 Pain, unspecified: Secondary | ICD-10-CM

## 2019-01-01 HISTORY — PX: IR US GUIDE BX ASP/DRAIN: IMG2392

## 2019-01-01 LAB — HEMOGLOBIN AND HEMATOCRIT, BLOOD
HCT: 31.2 % — ABNORMAL LOW (ref 36.0–46.0)
Hemoglobin: 10.1 g/dL — ABNORMAL LOW (ref 12.0–15.0)

## 2019-01-01 LAB — GLUCOSE, CAPILLARY
Glucose-Capillary: 118 mg/dL — ABNORMAL HIGH (ref 70–99)
Glucose-Capillary: 77 mg/dL (ref 70–99)
Glucose-Capillary: 80 mg/dL (ref 70–99)
Glucose-Capillary: 59 mg/dL — ABNORMAL LOW (ref 70–99)
Glucose-Capillary: 86 mg/dL (ref 70–99)
Glucose-Capillary: 127 mg/dL — ABNORMAL HIGH (ref 70–99)

## 2019-01-01 LAB — BASIC METABOLIC PANEL
Anion gap: 15 (ref 5–15)
BUN: 53 mg/dL — ABNORMAL HIGH (ref 6–20)
CO2: 24 mmol/L (ref 22–32)
Calcium: 8.3 mg/dL — ABNORMAL LOW (ref 8.9–10.3)
Chloride: 89 mmol/L — ABNORMAL LOW (ref 98–111)
Creatinine, Ser: 8.57 mg/dL — ABNORMAL HIGH (ref 0.44–1.00)
GFR calc Af Amer: 6 mL/min — ABNORMAL LOW (ref >60)
GFR calc non Af Amer: 5 mL/min — ABNORMAL LOW (ref >60)
Glucose, Bld: 98 mg/dL (ref 70–99)
Potassium: 3.3 mmol/L — ABNORMAL LOW (ref 3.5–5.1)
Sodium: 128 mmol/L — ABNORMAL LOW (ref 135–145)

## 2019-01-01 LAB — PREPARE RBC (CROSSMATCH)

## 2019-01-01 LAB — CBC
HCT: 21.3 % — ABNORMAL LOW (ref 36.0–46.0)
Hemoglobin: 6.8 g/dL — CL (ref 12.0–15.0)
MCH: 26.7 pg (ref 26.0–34.0)
MCHC: 31.9 g/dL (ref 30.0–36.0)
MCV: 83.5 fL (ref 80.0–100.0)
Platelets: 522 10*3/uL — ABNORMAL HIGH (ref 150–400)
RBC: 2.55 MIL/uL — ABNORMAL LOW (ref 3.87–5.11)
RDW: 14.3 % (ref 11.5–15.5)
WBC: 45.5 10*3/uL — ABNORMAL HIGH (ref 4.0–10.5)
nRBC: 0 % (ref 0.0–0.2)

## 2019-01-01 LAB — PROTIME-INR
INR: 1.2 (ref 0.8–1.2)
Prothrombin Time: 15.2 seconds (ref 11.4–15.2)

## 2019-01-01 LAB — HCV COMMENT:

## 2019-01-01 LAB — HEPATITIS C ANTIBODY (REFLEX): HCV Ab: 0.1 s/co ratio (ref 0.0–0.9)

## 2019-01-01 LAB — PHOSPHORUS: Phosphorus: 6.1 mg/dL — ABNORMAL HIGH (ref 2.5–4.6)

## 2019-01-01 MED ORDER — FLUCONAZOLE IN SODIUM CHLORIDE 200-0.9 MG/100ML-% IV SOLN
200.0000 mg | INTRAVENOUS | Status: DC
  Filled 2019-01-01: qty 100

## 2019-01-01 MED ORDER — OXYCODONE HCL 5 MG PO TABS: 10.0000 mg | ORAL_TABLET | Freq: Four times a day (QID) | ORAL | Status: DC

## 2019-01-01 MED ORDER — HEPARIN 1000 UNIT/ML FOR PERITONEAL DIALYSIS
INTRAPERITONEAL | Status: DC | PRN
  Filled 2019-01-01 (×2): qty 5000

## 2019-01-01 MED ORDER — HEPARIN 1000 UNIT/ML FOR PERITONEAL DIALYSIS: 2500.0000 [IU] | INTRAMUSCULAR | Status: DC

## 2019-01-01 MED ORDER — FENTANYL CITRATE (PF) 100 MCG/2ML IJ SOLN
INTRAMUSCULAR | Status: AC
  Filled 2019-01-01: qty 2

## 2019-01-01 MED ORDER — OXYCODONE HCL 5 MG PO TABS
5.0000 mg | ORAL_TABLET | Freq: Once | ORAL | Status: AC
  Administered 2019-01-01: 5 mg via ORAL
  Filled 2019-01-01: qty 1

## 2019-01-01 MED ORDER — LIDOCAINE HCL (PF) 1 % IJ SOLN
INTRAMUSCULAR | Status: AC | PRN
  Administered 2019-01-01: 10 mL

## 2019-01-01 MED ORDER — DELFLEX-LC/2.5% DEXTROSE 394 MOSM/L IP SOLN: INTRAPERITONEAL | Status: DC

## 2019-01-01 MED ORDER — MIDAZOLAM HCL 2 MG/2ML IJ SOLN
INTRAMUSCULAR | Status: AC
  Filled 2019-01-01: qty 2

## 2019-01-01 MED ORDER — LIDOCAINE HCL 1 % IJ SOLN
INTRAMUSCULAR | Status: AC
  Filled 2019-01-01: qty 20

## 2019-01-01 MED ORDER — HEPARIN SODIUM (PORCINE) 5000 UNIT/ML IJ SOLN
5000.0000 [IU] | Freq: Three times a day (TID) | INTRAMUSCULAR | Status: DC
  Administered 2019-01-01 – 2019-01-03 (×5): 5000 [IU] via SUBCUTANEOUS
  Filled 2019-01-01 (×5): qty 1

## 2019-01-01 MED ORDER — SODIUM CHLORIDE 0.9% IV SOLUTION: Freq: Once | INTRAVENOUS | Status: DC

## 2019-01-01 MED ORDER — OXYCODONE HCL 5 MG PO TABS
10.0000 mg | ORAL_TABLET | Freq: Four times a day (QID) | ORAL | Status: DC
  Administered 2019-01-01 – 2019-01-05 (×18): 10 mg via ORAL
  Filled 2019-01-01 (×18): qty 2

## 2019-01-01 MED ORDER — HEPARIN SODIUM (PORCINE) 5000 UNIT/ML IJ SOLN: 5000.0000 [IU] | Freq: Three times a day (TID) | INTRAMUSCULAR | Status: DC

## 2019-01-01 MED ORDER — FENTANYL CITRATE (PF) 100 MCG/2ML IJ SOLN
INTRAMUSCULAR | Status: AC | PRN
  Administered 2019-01-01 (×2): 50 ug via INTRAVENOUS

## 2019-01-01 MED ORDER — MIDAZOLAM HCL 2 MG/2ML IJ SOLN
INTRAMUSCULAR | Status: AC | PRN
  Administered 2019-01-01 (×2): 1 mg via INTRAVENOUS

## 2019-01-01 MED ORDER — DELFLEX-LC/2.5% DEXTROSE 394 MOSM/L IP SOLN: Freq: Once | INTRAPERITONEAL | Status: DC

## 2019-01-01 NOTE — Progress Notes (Signed)
[facility] Progress Note   Dialysis Orders: CCPD - 1 2.5 5 L bag and 1 1.5 5 L bag - 5 fills - 2 L fills with 1 L in last fill dwell 1.5 hr  Assessment/Plan: 1. Gluteal abscess -Surgery aspirated sacral fluid12/17which revealed only old blood. ID consulting. MRI showed possible osteomyelitis.  ID consulting. IR to do aspiration tomorrow 2. Leukocytosis -WBC 25 >33 K >38.9>41.6>49.4>47.8> 45.5 Gluteal abscess vs leukocytosis. ID consulting. On linezolid.likely due to OM.  2. Gastroparesis - chronic problem -on chronic Reglan 5 mg.  3.Peritonitis - cell count 333 - with 95% neutrophils - culture sentwith no organisms on gram stain.No abdominal pain.Per radiology eval of 12/14 CT scan there is no communication of the abscess with the peritoneum  4. ESRDwith mild hypokalemia - blood today will offer some K- on PD- continue 1.5% solution. Has an AVF so could easily transition to HD if she had surgery for abscess that might warrant a holiday from PDor if PD catheter had to be removed.No communication with peritoneum on 12/14 scan per radiology.PD cath clotting, improved with activase. Using heparinized solution. Na and wts creeping up - use 1/2 1.5 and 1/2 2.5 this evening only- currently  No daily time dwells. If K remains low add IV K 5. Anemia- hgb 10.3 >8.7>9.2>7.5> 6.8 held oral Fe to lessen pill burden with gastroparesis - increase Aranesp 15mcg qwk - has had in the past here - gets Mircera as outpatient (last dose in Sept) For one unit PRBC 6. Secondary hyperparathyroidism- Ca 8.5 on 3 Ca acetate ac - was heldto lessen pill burdenand with nausea- resume at d/c - continue calcitriol.Will add on P to BMP labs today 7.HTN/volume- BP variable-well controlled today. Does not appear volume overloaded. On Toprol 8 . DM- per primary 9. Nutrition - alb low -liberalized diet to regular to augment potassium intake and increase options  Myriam Jacobson, PA-C [place] [phone] 01/01/2019,8:03 AM  LOS: 6 days   Subjective:   She doesn't really know her EDW - in the 150s she thinks. Above EDW here - needs standing post PD wts . Intact erratic - still vomiting - already did this am.  Objective Vitals:   12/31/18 1900 12/31/18 2018 12/31/18 2113 01/01/19 0749  BP: 125/60  (!) 126/49   Pulse: 85  82   Resp:   14   Temp: 98.2 F (36.8 C)  98.6 F (37 C)   TempSrc: Oral  Oral   SpO2: 100% 98% 97% 98%  Weight: 79.3 kg  80.5 kg   Height:       Physical Exam General: NAD Heart: RRR Lungs: no rales Abdomen: soft but distended -  Extremities: no LE edema Dialysis Access: PD cath   Additional Objective Labs: Basic Metabolic Panel: Recent Labs  Lab 12/26/18 1047 12/30/18 0358 12/31/18 0642 01/01/19 0416  NA 134* 129* 127* 128*  K 4.5 3.1* 3.2* 3.3*  CL 101 91* 90* 89*  CO2 18* 23 22 24   GLUCOSE 217* 144* 110* 98  BUN 59* 48* 51* 53*  CREATININE 11.09* 9.18* 9.10* 8.57*  CALCIUM 8.5* 8.4* 8.5* 8.3*  PHOS 5.7*  --   --   --    Liver Function Tests: Recent Labs  Lab 12/25/18 1115 12/26/18 1047  AST 14*  --   ALT 15  --   ALKPHOS 122  --   BILITOT 0.8  --   PROT 7.2  --   ALBUMIN 2.4*  2.0*   Recent Labs  Lab 12/25/18 1115  LIPASE 15   CBC: Recent Labs  Lab 12/26/18 1047 12/28/18 0622 12/29/18 0454 12/30/18 0358 12/31/18 0642 01/01/19 0416  WBC 33.2* 39.3* 41.6*  42.4* 49.4* 47.8* 45.5*  NEUTROABS 30.2*  --  36.3*  --   --   --   HGB 8.9* 8.5* 9.2*  9.2* 8.1* 7.5* 6.8*  HCT 28.8* 27.6* 29.9*  30.0* 26.5* 23.7* 21.3*  MCV 85.0 85.2 86.2  86.2 86.3 84.3 83.5  PLT 425* 459* 490*  499* 564* 518* 522*   Blood Culture    Component Value Date/Time   SDES PERITONEAL DIALYSIS 12/26/2018 1503   SPECREQUEST NONE 12/26/2018 1503   CULT  12/26/2018 1503    NO GROWTH 3 DAYS Performed at [facility], [place] [address] [place], [place] [address]    REPTSTATUS  12/30/2018 FINAL 12/26/2018 1503    Cardiac Enzymes: No results for input(s): CKTOTAL, CKMB, CKMBINDEX, TROPONINI in the last 168 hours. CBG: Recent Labs  Lab 12/30/18 1708 12/30/18 2139 12/31/18 0746 12/31/18 1559 12/31/18 2119  GLUCAP 94 113* 94 117* 126*   Iron Studies: No results for input(s): IRON, TIBC, TRANSFERRIN, FERRITIN in the last 72 hours. Lab Results  Component Value Date   INR 0.91 02/01/2017   INR 0.9 07/26/2016   INR 1.07 10/23/2014   Studies/Results: MR PELVIS WO CONTRAST  Result Date: 12/31/2018 CLINICAL DATA:  History of abscess about the inner rectal region. EXAM: MRI PELVIS WITHOUT CONTRAST TECHNIQUE: Multiplanar multisequence MR imaging of the pelvis was performed. No intravenous contrast was administered. COMPARISON:  CT study of 12/25/2018 and 12/27/2018 FINDINGS: Urinary Tract: Urinary bladder is moderately distended without signs of hydronephrosis of the distal ureters. Bowel: Visualized bowel is unremarkable aside from some mild thickening of the distal rectum. Assessment is limited on today's study. Vascular/Lymphatic: Is vascular structures not well assessed given lack of intravenous contrast. Appropriate flow voids are present in the bilateral pelvic vasculature. Reproductive:  Post hysterectomy. Other:  Is diffuse body wall edema. Musculoskeletal: Perisacral and coccygeal fluid collection with complex features measuring 5.2 x 3.8 x 8.9 cm. Edema extends to the levator plate as does fluid. Perirectal edema is also noted on today's study. There is no clear evidence of rectal involvement aside from the edema this changes. Stranding also extends to the posterior margin of the puborectalis muscle and the sphincter complex though the fluid collection does not extend to this level. Mild increased T2 signal in the sacrum and coccyx. Signs of low T2 signal throughout visualized bony structures aside from the last sacral segment in the coccyx. Is Assessment overall is  limited due to patient position and discomfort with inability to Ree acquire limited sequences Fluid in stranding extends into the gluteal cleft left greater than right. High signal also involves the inferior margin of the sphincter complex on the left greater than right. IMPRESSION: 1. Peri sacral and coccygeal fluid collection with stranding extending into the levator plate and sphincter complex. No clear evidence of rectal involvement. Inflammatory changes extend into the left gluteal cleft and also involve the inferior margin of the anal sphincter complex. Anoscopy and or sigmoidoscopy may be helpful for further assessment given above findings. 2. Mild increased T2 signal in the sacrum and coccyx. Given recent history of trauma this is nonspecific, osteomyelitis or posttraumatic changes are considered. Given proximity of fluid osteomyelitis is favored at this time. 3. Diffuse body wall edema. 4. Post hysterectomy. Electronically Signed  By: Zetta Bills M.D.   On: 12/31/2018 09:49   Medications: . dialysis solution 1.5% low-MG/low-CA    . linezolid (ZYVOX) IV 600 mg (12/31/18 2130)   . sodium chloride   Intravenous Once  . acetaminophen  650 mg Oral Q6H  . amitriptyline  10 mg Oral QHS  . aspirin EC  81 mg Oral Daily  . calcitRIOL  1.5 mcg Oral Daily  . [START ON 01/03/2019] darbepoetin (ARANESP) injection - DIALYSIS  100 mcg Subcutaneous Q Wed-1800  . gentamicin cream  1 application Topical Daily  . heparin  5,000 Units Subcutaneous Q8H  . insulin aspart  0-6 Units Subcutaneous TID WC  . insulin glargine  10 Units Subcutaneous QHS  . metoCLOPramide  5 mg Oral TID AC & HS  . metoprolol succinate  50 mg Oral Daily  . mometasone-formoterol  2 puff Inhalation BID  . oxyCODONE  5 mg Oral Q6H  . pantoprazole  40 mg Oral Daily  . pravastatin  20 mg Oral q1800  . pregabalin  75 mg Oral Daily  . senna-docusate  1 tablet Oral Daily

## 2019-01-01 NOTE — Progress Notes (Signed)
Family Medicine Teaching Service Daily Progress Note Intern Pager: [phone]  Patient name: Susan Horton Medical record number: [id number] Date of birth: Nov 18, 1970 Age: 48 y.o. Gender: female  Primary Care Provider: Charlott Rakes, MD Consultants: Surgery  Code Status: Full   Pt Overview and Major Events to Date:  Admitted to [facility] on 12/25/18 Aspiration per Gen Sx on 12/28/18  Assessment and Plan: Susan L Jonesis a 48 y.o.femalepresenting with sacral phlegmon. PMH is significant forESRD on home PD, T2DM, HTN, CHF,PVD.  Sacral phlegmon Continues to report increased pain. White blood cells improving 49.4>47.8>45.5. Anoscopy and sigmoidoscopy recommended as it may be helpful for further assessment.  There is also some concern of osteomyelitis versus posttraumatic changes of the sacrum and coccyx.  Will defer further recommendations to infectious disease and general surgery who are both following along this patient.  If infectious disease desires we will be happy to consult GI. Consult IR for aspiration. -IR consulted: Aspiration today will appreciated recommendations pending aspiration  -continue abx: linezolid (12/18-) -ID consulted, appreciate recommendations -General surgery consulted, appreciate recommendations -Pain control: scheduled tylenol, scheduled oxy IR q6h increased to 10mg  -Vitals per routine  Anemia  Unknown etiology at this time but likely d/t on going infection. Hgb 6.8. 1U pRBCs transfusion overnight -f/u posttransfusion H/H   Constipation Reports 1 BM. If no further BM can consider mineral oil enema. Would avoid fleet enema in ESRD patient.  -Fleet enema -schedule senna daily   ESRD on home PD Home medications include PhosLo, calcitriol.  Missed PD yesterday in hopes of getting MRI.   -Nephrology consulted, appreciate recommendations  CAD Follows with Susan Horton.Patient has history of CAD and is on aspirin, Plavix and metoprolol. Patient  had drug-eluting stent placed in 2018. -Restart home aspirin and Plavix -Continue home metoprolol  PAD Medications include Plavix, aspirin, lovastatin 20 mg daily -Holding home Plavix, restart pending need for interventions  -continue ASA  -pravastatin 20 mg due to it being formulary in the hospital.  HFpEF Echo from 2019 showed EF of 55 to 60% with G2DD.Home medications as metoprolol XL -Monitor signs and symptoms of heart failure -Continue home metoprolol XL dose  T2 DMwith peripheral neuropathy Patient's home medications include Toujeo 25 units twice dailyand humulin SSI.AM CBG 94. No SSI needed today  -Holding home Toujeo -Sensitive sliding scale -10 units Lantus daily -Continue home Lyrica and amitriptyline -CBGs with meals  Gastroparesis Patient has history of gastroparesis for which she takes metoclopramide 5 mg at home. -Continue home metoclopramide  HTN Patient's home medications include Toprol-XL 50 mg daily -Continue home medications  Asthma Home medications include SingulairPRNat bedtime, Advair twice daily and Ventolin as needed. -Continue home asthma medications -Holding Singulair    FEN/GI: Renal/carb modified PPx: SCDs  Disposition: pending ID and gen sx recommendations   Subjective:  She is tired and continues to be have discomfort in her bottom  Objective: Temp:  [98.2 F (36.8 C)-98.6 F (37 C)] 98.6 F (37 C) (12/20 2113) Pulse Rate:  [82-87] 82 (12/20 2113) Resp:  [14] 14 (12/20 2113) BP: (121-126)/(49-60) 126/49 (12/20 2113) SpO2:  [97 %-100 %] 97 % (12/20 2113) Weight:  [79.3 kg-80.5 kg] 80.5 kg (12/20 2113) Physical Exam:  General: Appears to have discomfort, mild acute distress. Age appropriate. Sitting on side of bed Cardiac: RRR, normal heart sounds, no murmurs Respiratory: CTAB, normal effort Extremities: No edema or cyanosis. Skin: Warm and dry, no rashes noted Neuro: alert and oriented, no focal deficits Psych:  normal affect   Laboratory: Recent Labs  Lab 12/30/18 0358 12/31/18 0642 01/01/19 0416  WBC 49.4* 47.8* 45.5*  HGB 8.1* 7.5* 6.8*  HCT 26.5* 23.7* 21.3*  PLT 564* 518* 522*   Recent Labs  Lab 12/25/18 1115 12/30/18 0358 12/31/18 0642 01/01/19 0416  NA 138 129* 127* 128*  K 4.2 3.1* 3.2* 3.3*  CL 99 91* 90* 89*  CO2 22 23 22 24   BUN 55* 48* 51* 53*  CREATININE 11.09* 9.18* 9.10* 8.57*  CALCIUM 8.8* 8.4* 8.5* 8.3*  PROT 7.2  --   --   --   BILITOT 0.8  --   --   --   ALKPHOS 122  --   --   --   ALT 15  --   --   --   AST 14*  --   --   --   GLUCOSE 140* 144* 110* 98     Imaging/Diagnostic Tests: MRI PELVIS WITHOUT CONTRAST COMPARISON:  CT study of 12/25/2018 and 12/27/2018 IMPRESSION: 1. Peri sacral and coccygeal fluid collection with stranding extending into the levator plate and sphincter complex. No clear evidence of rectal involvement. Inflammatory changes extend into the left gluteal cleft and also involve the inferior margin of the anal sphincter complex. Anoscopy and or sigmoidoscopy may be helpful for further assessment given above findings. 2. Mild increased T2 signal in the sacrum and coccyx. Given recent history of trauma this is nonspecific, osteomyelitis or posttraumatic changes are considered. Given proximity of fluid osteomyelitis is favored at this time. 3. Diffuse body wall edema. 4. Post hysterectomy.    Susan Fee, DO 01/01/2019, 2:48 PM PGY-1, [place] Intern pager: [phone], text pages welcome

## 2019-01-01 NOTE — Progress Notes (Signed)
CRITICAL VALUE ALERT  Critical Value:  Hgb = 6.8  Date & Time Notied: 01/01/19 0515  Provider Notified: Amion coverage  Orders Received/Actions taken: transfuse 1 unit of PRBC

## 2019-01-01 NOTE — Progress Notes (Signed)
MD paged patient c/o 9/10 pain no PRN pain medication and not time for scheduled medication. Awaiting new orders.

## 2019-01-01 NOTE — Care Management Important Message (Signed)
Important Message  Patient Details  Name: Susan Horton MRN: [id number] Date of Birth: May 12, 1970   Medicare Important Message Given:  Yes     Memory Argue 01/01/2019, 2:13 PM

## 2019-01-01 NOTE — Progress Notes (Addendum)
   Request for aspiration of perirectal abscess  MR yesterday: 1. Peri sacral and coccygeal fluid collection with stranding extending into the levator plate and sphincter complex. No clear evidence of rectal involvement. Inflammatory changes extend into the left gluteal cleft and also involve the inferior margin of the anal sphincter complex. Anoscopy and or sigmoidoscopy may be helpful for further assessment given above findings.  Dr Laurence Ferrari has reviewed imaging Rec: I/D- packing Of this superficial finding  Will call [facility] PA Margie Billet    ADDENDUM: [facility] has spoken to Dr Laurence Ferrari He will move forward with aspiration  Will see pt asap

## 2019-01-01 NOTE — Progress Notes (Signed)
FPTS Interim Progress Note  Patient seen for f/u pain control after change to oxy 10mg  q6h sch.  She also had US guided aspiration of perisacral fluid.  Patient notes that her pain is tolerable at the moment that she does not need any more pain medication at this time.  Per IR note, aspiration yielded 1 to 2 mL of opaque yellow fluid, suspect infection versus fat necrosis.  Cultures were taken, will continue to follow.  Overnight, patient has worsening, would recommend adding on as needed oxycodone, to not change type of narcotic that patient is receiving.  Will hold off at this time given that she is comfortable.  [place], DO 01/01/2019, 5:49 PM PGY-2, [place] Service pager [phone]

## 2019-01-01 NOTE — Procedures (Signed)
Interventional Radiology Procedure Note  Procedure: US guided aspiration of complex fluid in the midline gluteal cleft yielding 1-2 mL opaque yellow fluid.  Infection vs fat necrosis?  Complications: None  Estimated Blood Loss: None  Recommendations: - Fluid vs. Saponified fatty tissue, unable to aspirate much at all.  - Suspect fat necrosis, superimposed infection/paniculitis not excluded - Follow cultures  Signed,  Criselda Peaches, MD

## 2019-01-01 NOTE — Progress Notes (Signed)
[facility] Progress Note     Subjective: CC-  Complaining of persistent perirectal/buttock pain. No worse than yesterday. Denies any drainage from this area. MRI yesterday showed 5.2 x 3.8 x 8.9 cm perisacral and coccygeal fluid collection with complex features. She reports some nausea but states that it is no worse than her baseline. Denies abdominal pain and tolerating PD. Tolerating diet.  WBC 45.5, afebrile Hemoglobin 6.8, 1 units PRBCs has been ordered.  Objective: Vital signs in last 24 hours: Temp:  [98.2 F (36.8 C)-98.6 F (37 C)] 98.6 F (37 C) (12/20 2113) Pulse Rate:  [82-87] 82 (12/20 2113) Resp:  [14] 14 (12/20 2113) BP: (121-126)/(49-60) 126/49 (12/20 2113) SpO2:  [97 %-100 %] 98 % (12/21 0749) Weight:  [79.3 kg-80.5 kg] 80.5 kg (12/20 2113) Last BM Date: 12/31/18  Intake/Output from previous day: 12/20 0701 - 12/21 0700 In: 920 [P.O.:120; IV Piggyback:800] Out: -  Intake/Output this shift: No intake/output data recorded.  PE: Gen:  Alert, NAD HEENT: EOM's intact, pupils equal and round Pulm:  Rate and effort normal Abd: Soft, NT/ND, +BS, PD catheter in place without erythema or drainage Skin: warm and dry GU: no perirectal/gluteal erythema or active drainage noted, some induration tracks down bilateral gluts towards rectum, no fluctuance  Lab Results:  Recent Labs    12/31/18 0642 01/01/19 0416  WBC 47.8* 45.5*  HGB 7.5* 6.8*  HCT 23.7* 21.3*  PLT 518* 522*   BMET Recent Labs    12/31/18 0642 01/01/19 0416  NA 127* 128*  K 3.2* 3.3*  CL 90* 89*  CO2 22 24  GLUCOSE 110* 98  BUN 51* 53*  CREATININE 9.10* 8.57*  CALCIUM 8.5* 8.3*   PT/INR No results for input(s): LABPROT, INR in the last 72 hours. CMP     Component Value Date/Time   NA 128 (L) 01/01/2019 0416   NA 132 (L) 07/26/2016 0000   K 3.3 (L) 01/01/2019 0416   CL 89 (L) 01/01/2019 0416   CO2 24 01/01/2019 0416   GLUCOSE 98 01/01/2019 0416   BUN 53 (H)  01/01/2019 0416   BUN 39 (H) 07/26/2016 0000   CREATININE 8.57 (H) 01/01/2019 0416   CALCIUM 8.3 (L) 01/01/2019 0416   CALCIUM 8.4 (L) 06/21/2017 1939   PROT 7.2 12/25/2018 1115   PROT 6.7 07/21/2017 0959   ALBUMIN 2.0 (L) 12/26/2018 1047   ALBUMIN 3.2 (L) 07/21/2017 0959   AST 14 (L) 12/25/2018 1115   ALT 15 12/25/2018 1115   ALKPHOS 122 12/25/2018 1115   BILITOT 0.8 12/25/2018 1115   BILITOT <0.2 07/21/2017 0959   GFRNONAA 5 (L) 01/01/2019 0416   GFRAA 6 (L) 01/01/2019 0416   Lipase     Component Value Date/Time   LIPASE 15 12/25/2018 1115       Studies/Results: MR PELVIS WO CONTRAST  Result Date: 12/31/2018 CLINICAL DATA:  History of abscess about the inner rectal region. EXAM: MRI PELVIS WITHOUT CONTRAST TECHNIQUE: Multiplanar multisequence MR imaging of the pelvis was performed. No intravenous contrast was administered. COMPARISON:  CT study of 12/25/2018 and 12/27/2018 FINDINGS: Urinary Tract: Urinary bladder is moderately distended without signs of hydronephrosis of the distal ureters. Bowel: Visualized bowel is unremarkable aside from some mild thickening of the distal rectum. Assessment is limited on today's study. Vascular/Lymphatic: Is vascular structures not well assessed given lack of intravenous contrast. Appropriate flow voids are present in the bilateral pelvic vasculature. Reproductive:  Post hysterectomy. Other:  Is diffuse body wall edema.  Musculoskeletal: Perisacral and coccygeal fluid collection with complex features measuring 5.2 x 3.8 x 8.9 cm. Edema extends to the levator plate as does fluid. Perirectal edema is also noted on today's study. There is no clear evidence of rectal involvement aside from the edema this changes. Stranding also extends to the posterior margin of the puborectalis muscle and the sphincter complex though the fluid collection does not extend to this level. Mild increased T2 signal in the sacrum and coccyx. Signs of low T2 signal throughout  visualized bony structures aside from the last sacral segment in the coccyx. Is Assessment overall is limited due to patient position and discomfort with inability to Ree acquire limited sequences Fluid in stranding extends into the gluteal cleft left greater than right. High signal also involves the inferior margin of the sphincter complex on the left greater than right. IMPRESSION: 1. Peri sacral and coccygeal fluid collection with stranding extending into the levator plate and sphincter complex. No clear evidence of rectal involvement. Inflammatory changes extend into the left gluteal cleft and also involve the inferior margin of the anal sphincter complex. Anoscopy and or sigmoidoscopy may be helpful for further assessment given above findings. 2. Mild increased T2 signal in the sacrum and coccyx. Given recent history of trauma this is nonspecific, osteomyelitis or posttraumatic changes are considered. Given proximity of fluid osteomyelitis is favored at this time. 3. Diffuse body wall edema. 4. Post hysterectomy. Electronically Signed   By: Zetta Bills M.D.   On: 12/31/2018 09:49    Anti-infectives: Anti-infectives (From admission, onward)   Start     Dose/Rate Route Frequency Ordered Stop   01/01/19 1800  fluconazole (DIFLUCAN) IVPB 200 mg     200 mg 100 mL/hr over 60 Minutes Intravenous Every 24 hours 01/01/19 0820     12/31/18 1500  fluconazole (DIFLUCAN) IVPB 400 mg     400 mg 100 mL/hr over 120 Minutes Intravenous NOW 12/31/18 1413 12/31/18 1715   12/29/18 1400  linezolid (ZYVOX) IVPB 600 mg     600 mg 300 mL/hr over 60 Minutes Intravenous Every 12 hours 12/29/18 1312     12/28/18 1800  ampicillin-sulbactam (UNASYN) 1.5 g in sodium chloride 0.9 % 100 mL IVPB  Status:  Discontinued     1.5 g 200 mL/hr over 30 Minutes Intravenous Every 12 hours 12/28/18 1429 12/29/18 1311   12/28/18 1230  amoxicillin (AMOXIL) capsule 500 mg     500 mg Oral  Once 12/28/18 1218 12/28/18 1253   12/28/18  1115  penicillin G in sodium chloride 10,000 units/mL syringe for skin test - 1st bleb     1,000 Units Intradermal  Once 12/28/18 1034 12/28/18 1130   12/28/18 1115  penicillin G in sodium chloride 10,000 units/mL syringe for skin test - 2nd bleb     1,000 Units Intradermal  Once 12/28/18 1034 12/28/18 1130   12/28/18 1100  penicillin G in sodium chloride 10,000 units/mL syringe for skin test     1,000 Units Topical  Once 12/28/18 1034 12/28/18 1130   12/28/18 1030  penicillin G in sodium chloride 10,000 units/mL syringe for skin test  Status:  Discontinued     1,000 Units Topical  Once 12/28/18 1018 12/28/18 1034   12/28/18 1030  penicillin G in sodium chloride 10,000 units/mL syringe for skin test - 1st bleb  Status:  Discontinued     1,000 Units Intradermal  Once 12/28/18 1018 12/28/18 1034   12/28/18 1030  penicillin G in sodium chloride  10,000 units/mL syringe for skin test - 2nd bleb  Status:  Discontinued     1,000 Units Intradermal  Once 12/28/18 1018 12/28/18 1034   12/26/18 0130  aztreonam (AZACTAM) 0.5 g in dextrose 5 % 50 mL IVPB  Status:  Discontinued     0.5 g 100 mL/hr over 30 Minutes Intravenous Every 8 hours 12/25/18 1858 12/28/18 1429   12/26/18 0000  vancomycin variable dose per unstable renal function (pharmacist dosing)  Status:  Discontinued      Does not apply See admin instructions 12/25/18 1634 12/28/18 1139   12/25/18 2200  aztreonam (AZACTAM) 0.5 g in dextrose 5 % 50 mL IVPB  Status:  Discontinued     0.5 g 100 mL/hr over 30 Minutes Intravenous Every 12 hours 12/25/18 1634 12/25/18 1646   12/25/18 1900  metroNIDAZOLE (FLAGYL) IVPB 500 mg  Status:  Discontinued     500 mg 100 mL/hr over 60 Minutes Intravenous Every 8 hours 12/25/18 1858 12/29/18 1327   12/25/18 1700  vancomycin (VANCOCIN) 2,000 mg in sodium chloride 0.9 % 500 mL IVPB  Status:  Discontinued     2,000 mg 250 mL/hr over 120 Minutes Intravenous  Once 12/25/18 1648 12/25/18 2030   12/25/18 1646   aztreonam (AZACTAM) 0.5 g in dextrose 5 % 50 mL IVPB  Status:  Discontinued     0.5 g 100 mL/hr over 30 Minutes Intravenous Every 8 hours 12/25/18 1646 12/25/18 1851   12/25/18 1645  vancomycin (VANCOCIN) IVPB 1000 mg/200 mL premix  Status:  Discontinued     1,000 mg 200 mL/hr over 60 Minutes Intravenous  Once 12/25/18 1631 12/25/18 1634   12/25/18 1645  aztreonam (AZACTAM) 2 g in sodium chloride 0.9 % 100 mL IVPB  Status:  Discontinued     2 g 200 mL/hr over 30 Minutes Intravenous  Once 12/25/18 1631 12/25/18 1634   12/25/18 1645  vancomycin (VANCOCIN) IVPB 1000 mg/200 mL premix  Status:  Discontinued     1,000 mg 200 mL/hr over 60 Minutes Intravenous  Once 12/25/18 1632 12/25/18 1634   12/25/18 1645  aztreonam (AZACTAM) 1 g in sodium chloride 0.9 % 100 mL IVPB  Status:  Discontinued     1 g 200 mL/hr over 30 Minutes Intravenous  Once 12/25/18 1632 12/25/18 1634   12/25/18 1645  vancomycin (VANCOCIN) 1,500 mg in sodium chloride 0.9 % 500 mL IVPB  Status:  Discontinued     1,500 mg 250 mL/hr over 120 Minutes Intravenous  Once 12/25/18 1634 12/25/18 1648       Assessment/Plan ESRD on PD CAD s/p mLAD PCI with DES - Plavix on hold PVD HTN DM type II  67F s/p mechanical ground level falls Presacral fluid collection -CT 12/14 w/2 x 1 x 1 cmphlegmon on CT scan that likely is an infected hematoma after GLF on 12/9. - CT 12/16 w/ enlarging fluid collection in the medial left buttock subcutaneous soft tissues measuring 2.9 x 1.9 cm - s/p bedside aspiration 12/17, aspirate appeared to be old blood - MRI 12/20 showed 5.2 x 3.8 x 8.9 cm perisacral and coccygeal fluid collection with complex features. ID requesting re-aspiration and send for gram stain, aerobic and anaerobic culture  FEN -Renal diet, NPO after MN VTE -sq Heparin  ID -Aztreonam/Flagyl/Vanc 12/14 >>. Per ID  Plan: Patient seen and examined with Dr. Dalbert Batman. She has some gluteal induration but no drainage and no  fluctuance. No external signs of abscess.  Recommend IR consult for u/s  guided aspiration. I discussed this with the primary team who will call IR. Send for gram stain, aerobic and anaerobic culture. If this is infected she may require I&D in the OR. Will continue to follow.    LOS: 6 days    [facility] 01/01/2019, 8:49 AM Please see Amion for pager number during day hours 7:00am-4:30pm

## 2019-01-01 NOTE — Consult Note (Signed)
Chief Complaint: Patient was seen in consultation today for perisacral fluid collection aspiration Chief Complaint  Patient presents with   Back Pain   Abdominal Pain   Emesis   at the request of Dr Leane Para  Supervising Physician: Jacqulynn Cadet  Patient Status: [facility] - In-pt  History of Present Illness: Susan Horton is a 48 y.o. female   Fall at home Buttock pain since then  CT 12/4 showing small collection at perisacral area CT 12/16: slight enlargement in collection Bedside aspiration 12/17: old blood  MRI yesterday: IMPRESSION: 1. Peri sacral and coccygeal fluid collection with stranding extending into the levator plate and sphincter complex. No clear evidence of rectal involvement. Inflammatory changes extend into the left gluteal cleft and also involve the inferior margin of the anal sphincter complex. Anoscopy and or sigmoidoscopy may be helpful for further assessment given above findings. 2. Mild increased T2 signal in the sacrum and coccyx. Given recent history of trauma this is nonspecific, osteomyelitis or posttraumatic changes are considered. Given proximity of fluid osteomyelitis is favored at this time. 3. Diffuse body wall edema. 4. Post hysterectomy.  Dr Dalbert Batman note today: I suspect bilateral gluteal cleft hematoma secondary to fall. Perirectal abscess possible but seems unlikely given physical exam, imaging findings, and history of fall. This is quite symptomatic and secondary bacterial infection is suspected She will be sent to IR this morning for aspiration.  If this turns out to be purulent fluid or is infected, she should be taken to the operating room tomorrow for EUA,drainage of infected hematomas, anoscopy. Continue antibiotics in the interim.   Request for aspiration in IR Dr Laurence Ferrari has approved procedure   Past Medical History:  Diagnosis Date   Anemia    Angio-edema    Asthma    CAD (coronary artery disease)    DES  to mid LAD July 2018, residual moderate RCA disease   Cataract    CKD (chronic kidney disease) stage 4, GFR 15-29 ml/min (HCC)    Dialysis T/Th/Sa   DVT (deep venous thrombosis) ([place])    1996 during pregnancy, 2015 left leg   Eczema    Essential hypertension 12/11/2015   Gastroparesis    GERD (gastroesophageal reflux disease)    Gluteal abscess 12/27/2018   Hidradenitis    Migraine    Neuropathy    Peripheral vascular disease (HCC)    blood clot in leg   Type 2 diabetes mellitus ([place])    Type II diabetes mellitus ([place])    Urticaria     Past Surgical History:  Procedure Laterality Date   A/V FISTULAGRAM N/A 06/22/2017   Procedure: A/V FISTULAGRAM - left arm;  Surgeon: [name], MD;  Location: [place] [facility];  Service: Cardiovascular;  Laterality: N/A;   ABDOMINAL AORTOGRAM W/LOWER EXTREMITY Bilateral 12/14/2018   Procedure: ABDOMINAL AORTOGRAM W/LOWER EXTREMITY;  Surgeon: [name], MD;  Location: [place] [facility];  Service: Cardiovascular;  Laterality: Bilateral;   ABDOMINOPLASTY     ADENOIDECTOMY     APPENDECTOMY  1995   AV FISTULA PLACEMENT Left 02/01/2017   Procedure: ARTERIOVENOUS BRACHIOCEPHALIC (AV) FISTULA CREATION;  Surgeon: [name], MD;  Location: [place];  Service: Vascular;  Laterality: Left;   [place] INTERVENTION N/A 08/05/2016   Procedure: Coronary Stent Intervention;  Surgeon: [name], MD;  Location: [place] [facility];  Service: Cardiovascular;  Laterality: N/A;  EXPLORATORY LAPAROTOMY  08/14/2005   lysis of adhesions, drainage of tubo-ovarian abscess   FISTULA SUPERFICIALIZATION Left 09/14/2017   Procedure: FISTULA SUPERFICIALIZATION ARTERIOVENOUS FISTULA LEFT ARM;  Surgeon: [name], MD;  Location: [place];  Service: Vascular;  Laterality: Left;   HYDRADENITIS EXCISION  02/08/2011   Procedure: EXCISION  HYDRADENITIS GROIN;  Surgeon: [name], MD;  Location: [place];  Service: General;  Laterality: N/A;  Excisioin of Hidradenitis Left groin   INGUINAL HIDRADENITIS EXCISION  07/06/2010   bilateral   INSERTION OF DIALYSIS CATHETER N/A 09/14/2017   Procedure: INSERTION OF TUNNELED DIALYSIS CATHETER Right Internal Jugular;  Surgeon: [name], MD;  Location: [place];  Service: Vascular;  Laterality: N/A;   LEFT HEART CATH AND CORONARY ANGIOGRAPHY N/A 08/05/2016   Procedure: Left Heart Cath and Coronary Angiography;  Surgeon: [name], MD;  Location: [place] [facility];  Service: Cardiovascular;  Laterality: N/A;   PERIPHERAL VASCULAR INTERVENTION Left 12/14/2018   Procedure: PERIPHERAL VASCULAR INTERVENTION;  Surgeon: [name], MD;  Location: [place] [facility];  Service: Cardiovascular;  Laterality: Left;  SFA   REDUCTION MAMMAPLASTY  2002   TONSILLECTOMY     TUBAL LIGATION  1996   VAGINAL HYSTERECTOMY  08/04/2005   and cysto    Allergies: Repatha [evolocumab] and Lisinopril  Medications: Prior to Admission medications   Medication Sig Start Date End Date Taking? Authorizing Provider  albuterol (PROVENTIL HFA;VENTOLIN HFA) 108 (90 BASE) MCG/ACT inhaler Inhale 1-2 puffs into the lungs every 6 (six) hours as needed for wheezing or shortness of breath.    Yes [provider]  amitriptyline (ELAVIL) 10 MG tablet Take 1 tablet (10 mg total) by mouth at bedtime. 09/19/18  Yes Jamse Arn, MD  aspirin 81 MG chewable tablet Chew 1 tablet (81 mg total) by mouth daily. 08/07/16  Yes Cheryln Manly, NP  B Complex-C-Zn-Folic Acid (DIALYVITE 932 WITH ZINC) 0.8 MG TABS Take 1 tablet by mouth daily. 10/06/18  Yes [provider]  calcitRIOL (ROCALTROL) 0.5 MCG capsule Take 1.5 mcg by mouth daily. 09/11/18  Yes [provider]  calcium acetate (PHOSLO) 667 MG capsule Take 1,334-2,001 mg by mouth See admin  instructions. Take three capsules (2001 mg) by mouth up to three times daily with meals and two capsules (1334 mg) with snacks 09/23/17  Yes [provider]  clopidogrel (PLAVIX) 75 MG tablet Take 1 tablet (75 mg total) by mouth daily with breakfast. 10/19/17  Yes Kilroy, Luke K, PA-C  Ferrous Sulfate (IRON) 325 (65 Fe) MG TABS Take 325 mg by mouth daily. 11/25/15  Yes Noel, Tiffany S, PA-C  Fluticasone-Salmeterol (ADVAIR) 250-50 MCG/DOSE AEPB Inhale 1 puff into the lungs 2 (two) times daily. Patient taking differently: Inhale 1 puff into the lungs daily as needed (shortness of breath).  06/10/17  Yes Charlott Rakes, MD  gentamicin cream (GARAMYCIN) 0.1 % Apply 1 application topically See admin instructions. Apply to access site nightly 09/11/18  Yes [provider]  HYDROcodone-acetaminophen (NORCO/VICODIN) 5-325 MG tablet Take 1 tablet by mouth every 6 (six) hours as needed for severe pain. 12/23/18  Yes Recardo Evangelist, PA-C  Insulin Glargine, 1 Unit Dial, (TOUJEO SOLOSTAR) 300 UNIT/ML SOPN Inject 25 Units into the skin 2 (two) times a day. Patient taking differently: Inject 50 Units into the skin 2 (two) times a day.  05/22/18  Yes Philemon Kingdom, MD  insulin regular (HUMULIN R) 100 units/mL injection Inject 0.2-0.25  mLs (20-25 Units total) into the skin See admin instructions. Patient taking differently: Inject 7-15 Units into the skin 3 (three) times daily before meals. Per CBG 09/22/17  Yes Philemon Kingdom, MD  lidocaine-prilocaine (EMLA) cream Apply 1 application topically See admin instructions. Apply small amount to access site (AVF) 30 minutes before hemodialysis, cover with occlusive dressing (Saran Wrap) if going out to dialysis center 09/15/17  Yes [provider]  lovastatin (MEVACOR) 20 MG tablet Take 20 mg by mouth at bedtime.    Yes [provider]  methocarbamol (ROBAXIN) 500 MG tablet Take 1 tablet (500 mg total) by mouth 2 (two) times daily as  needed for muscle spasms. 09/07/17  Yes Patel, Domenick Bookbinder, MD  metoCLOPramide (REGLAN) 5 MG tablet TAKE 1 TABLET BY MOUTH 4 TIMES DAILY BEFORE MEALS AND AT BEDTIME. Patient taking differently: Take 5 mg by mouth 4 (four) times daily -  before meals and at bedtime.  03/18/17  Yes [name], MD  metoprolol succinate (TOPROL-XL) 50 MG 24 hr tablet Take 1 tablet (50 mg total) by mouth daily. 03/14/18  Yes [name], MD  montelukast (SINGULAIR) 10 MG tablet Take 1 tablet (10 mg total) by mouth at bedtime. Patient taking differently: Take 10 mg by mouth at bedtime as needed (allergies).  04/12/18  Yes Padgett, Rae Halsted, MD  mupirocin cream (BACTROBAN) 2 % Apply 1 application topically 2 (two) times daily as needed. To affected areas and cover with non-stick gauze. 11/17/18  Yes Amyot, Nicholes Stairs, NP  nitroGLYCERIN (NITROSTAT) 0.4 MG SL tablet Place 1 tablet (0.4 mg total) under the tongue every 5 (five) minutes as needed for chest pain. 10/19/17  Yes Kilroy, Luke K, PA-C  pantoprazole (PROTONIX) 40 MG tablet TAKE 1 TABLET BY MOUTH DAILY. Patient taking differently: Take 40 mg by mouth daily.  02/14/18  Yes [name], MD  pregabalin (LYRICA) 75 MG capsule Take 1 capsule (75 mg total) by mouth daily. 09/19/18  Yes Patel, Domenick Bookbinder, MD  senna-docusate (SENOKOT-S) 8.6-50 MG tablet Take 1 tablet by mouth 2 (two) times daily. Patient taking differently: Take 1 tablet by mouth daily as needed for moderate constipation.  08/30/16  Yes Vann, Jessica U, DO  traMADol (ULTRAM) 50 MG tablet Take 1 tablet (50 mg total) by mouth every 12 (twelve) hours as needed for severe pain. 11/17/18  Yes Amyot, Nicholes Stairs, NP  trimethoprim-polymyxin b (POLYTRIM) ophthalmic solution Place 1 drop into both eyes See admin instructions. Instill one drop into both eyes four times daily for 2 days after each monthly eye injection 06/16/17  Yes [provider]  isosorbide mononitrate (IMDUR) 30 MG 24 hr tablet Take  0.5 tablets (15 mg total) by mouth daily. 10/19/17 12/12/18  Erlene Quan, PA-C     Family History  Problem Relation Age of Onset   Allergic rhinitis Mother    Hypertension Father    Allergic rhinitis Father    Hypertension Sister    Allergic rhinitis Sister    Hypertension Brother    Allergic rhinitis Brother    Breast cancer Cousin        x2    Social History   Socioeconomic History   Marital status: Single    Spouse name: Not on file   Number of children: 3   Years of education: 14   Highest education level: Not on file  Occupational History   Occupation: Curator  Tobacco Use   Smoking status: Former Smoker  Years: 20.00    Types: Cigarettes    Quit date: 11/2016    Years since quitting: 2.1   Smokeless tobacco: Never Used  Substance and Sexual Activity   Alcohol use: No    Alcohol/week: 0.0 standard drinks   Drug use: No   Sexual activity: Not on file  Other Topics Concern   Not on file  Social History Narrative   Lives with daughter in a 2 story home.  Has 3 children.  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  Education: college.    Social Determinants of Health   Financial Resource Strain:    Difficulty of Paying Living Expenses: Not on file  Food Insecurity:    Worried About Charity fundraiser in the Last Year: Not on file   [employer] of Food in the Last Year: Not on file  Transportation Needs:    Lack of Transportation (Medical): Not on file   Lack of Transportation (Non-Medical): Not on file  Physical Activity:    Days of Exercise per Week: Not on file   Minutes of Exercise per Session: Not on file  Stress:    Feeling of Stress : Not on file  Social Connections:    Frequency of Communication with Friends and Family: Not on file   Frequency of Social Gatherings with Friends and Family: Not on file   Attends Religious Services: Not on file   Active Member of Clubs or Organizations:  Not on file   Attends Archivist Meetings: Not on file   Marital Status: Not on file     Review of Systems: A 12 point ROS discussed and pertinent positives are indicated in the HPI above.  All other systems are negative.  Review of Systems  Constitutional: Positive for activity change. Negative for fatigue and fever.  Respiratory: Negative for cough and shortness of breath.   Cardiovascular: Negative for chest pain.  Gastrointestinal: Negative for abdominal pain.  Musculoskeletal: Positive for back pain.       Buttock pain  Psychiatric/Behavioral: Negative for behavioral problems and confusion.    Vital Signs: BP (!) 126/54    Pulse 85    Temp 98.6 F (37 C) (Oral)    Resp 16    Ht 5\' 5"  (1.651 m)    Wt 176 lb 12.9 oz (80.2 kg)    SpO2 98%    BMI 29.42 kg/m   Physical Exam Vitals reviewed.  Cardiovascular:     Rate and Rhythm: Normal rate and regular rhythm.  Pulmonary:     Breath sounds: Normal breath sounds.  Abdominal:     Palpations: Abdomen is soft.     Tenderness: There is no abdominal tenderness.  Skin:    General: Skin is dry.  Neurological:     Mental Status: She is alert and oriented to person, place, and time.  Psychiatric:        Behavior: Behavior normal.     Imaging: CT Abdomen Pelvis Wo Contrast  Addendum Date: 12/27/2018   ADDENDUM REPORT: 12/27/2018 15:53 ADDENDUM: Case was discussed with Dr. Royce Macadamia of nephrology. 12/27/2018 at 3:50 pm Inflammation and phlegmonous change is centered in the posterior gluteal cleft and ischioanal soft tissues with possible extension into the intersphincteric plane, which is in extraperitoneal space. No clearly discernible extension into the peritoneal space is seen on these images within the limitations of CT soft tissue resolution in the pelvis. Additionally, the increased stranding in both kidneys was discussed  as well. This finding is nonspecific and could reflect worsening renal function superimposed  urinary tract infection could have a similar appearance and could be excluded with urinalysis if patient is able to produce urine. Electronically Signed   By: Lovena Le M.D.   On: 12/27/2018 15:53   Result Date: 12/27/2018 CLINICAL DATA:  Lower back pain radiating to buttocks, hard knot in the sacral region. EXAM: CT ABDOMEN AND PELVIS WITHOUT CONTRAST TECHNIQUE: Multidetector CT imaging of the abdomen and pelvis was performed following the standard protocol without IV contrast. Patient unable to tolerate supine scanning, images obtained in a decubitus positioning. COMPARISON:  CT abdomen pelvis 03/17/2017 FINDINGS: Lower chest: Lung bases are clear. Normal heart size. No pericardial effusion. Hepatobiliary: No focal liver abnormality is seen. Patient is post cholecystectomy. Slight prominence of the biliary tree likely related to reservoir effect. No calcified intraductal gallstones. Pancreas: Unremarkable. No pancreatic ductal dilatation or surrounding inflammatory changes. Spleen: Normal in size without focal abnormality. Adrenals/Urinary Tract: Normal adrenal glands. Increasing nonspecific bilateral perinephric stranding. No visible or concerning renal lesions. No urolithiasis or hydronephrosis. Urinary bladder is unremarkable. Stomach/Bowel: Distal esophagus, stomach and duodenal sweep are unremarkable. No small bowel wall thickening or dilatation. No evidence of obstruction. The appendix is surgically absent. No colonic dilatation or wall thickening. Minimal rectal wall thickening. Vascular/Lymphatic: Atherosclerotic plaque within the normal caliber aorta. No suspicious or enlarged lymph nodes in the included lymphatic chains. Reproductive: Uterus is surgically absent. No concerning adnexal lesions. Suspect retained ovarian tissue. Other: There is extensive soft tissue stranding and phlegmonous change in the superior gluteal cleft extending to the tip of the sacrum and towards the mesorectal fat within  ill-defined possible fluid collection along the left lateral aspect of this inflammation measuring approximately 2 x 1 x 1 cm in size. Inflammatory change extends to the levator plate with some hazy intersphincteric stranding which could suggest some transsphincteric extension. No soft tissue gas is seen. No free fluid or air in the abdomen or pelvis. Peritoneal catheter terminates in the left lower quadrant. Musculoskeletal: No features of subjacent osteomyelitis of the sacrum or coccyx. No acute osseous abnormality or suspicious osseous lesion. Mild levocurvature of the spine is likely positional. IMPRESSION: 1. Extensive stranding and phlegmonous change in the superior gluteal cleft and ischioanal fossa extending to the tip of the sacrum and towards the levator plate within ill-defined possible fluid collection along the left lateral aspect of this measuring approximately 2 x 1 x 1 cm in size possibly reflecting developing abscess. Inflammatory change extends to the levator plate with some hazy intersphincteric stranding which could suggest some transsphincteric extension. No soft tissue gas is seen. No features of subjacent osteomyelitis of the sacrum or coccyx. 2. Increasing bilateral nonspecific perinephric stranding. Could be related to patient's decreased renal function though could consider urinalysis to exclude superimposed urinary tract infection. 3. Peritoneal catheter, likely peritoneal dialysis catheter terminating in the left lower quadrant. 4.  Aortic Atherosclerosis (ICD10-I70.0). Electronically Signed: By: Lovena Le M.D. On: 12/25/2018 16:15   DG Sacrum/Coccyx  Result Date: 12/23/2018 CLINICAL DATA:  Pt in with tailbone pain s/p fall 4 days ago. Able to ambulate, states it hurst to sit as there is a knot to her L buttocks EXAM: SACRUM AND COCCYX - 2+ VIEW COMPARISON:  None. FINDINGS: There is no evidence of fracture or other focal bone lesions. IMPRESSION: Negative. Electronically Signed    By: Lajean Manes M.D.   On: 12/23/2018 17:10   MR PELVIS WO  CONTRAST  Result Date: 12/31/2018 CLINICAL DATA:  History of abscess about the inner rectal region. EXAM: MRI PELVIS WITHOUT CONTRAST TECHNIQUE: Multiplanar multisequence MR imaging of the pelvis was performed. No intravenous contrast was administered. COMPARISON:  CT study of 12/25/2018 and 12/27/2018 FINDINGS: Urinary Tract: Urinary bladder is moderately distended without signs of hydronephrosis of the distal ureters. Bowel: Visualized bowel is unremarkable aside from some mild thickening of the distal rectum. Assessment is limited on today's study. Vascular/Lymphatic: Is vascular structures not well assessed given lack of intravenous contrast. Appropriate flow voids are present in the bilateral pelvic vasculature. Reproductive:  Post hysterectomy. Other:  Is diffuse body wall edema. Musculoskeletal: Perisacral and coccygeal fluid collection with complex features measuring 5.2 x 3.8 x 8.9 cm. Edema extends to the levator plate as does fluid. Perirectal edema is also noted on today's study. There is no clear evidence of rectal involvement aside from the edema this changes. Stranding also extends to the posterior margin of the puborectalis muscle and the sphincter complex though the fluid collection does not extend to this level. Mild increased T2 signal in the sacrum and coccyx. Signs of low T2 signal throughout visualized bony structures aside from the last sacral segment in the coccyx. Is Assessment overall is limited due to patient position and discomfort with inability to Ree acquire limited sequences Fluid in stranding extends into the gluteal cleft left greater than right. High signal also involves the inferior margin of the sphincter complex on the left greater than right. IMPRESSION: 1. Peri sacral and coccygeal fluid collection with stranding extending into the levator plate and sphincter complex. No clear evidence of rectal involvement.  Inflammatory changes extend into the left gluteal cleft and also involve the inferior margin of the anal sphincter complex. Anoscopy and or sigmoidoscopy may be helpful for further assessment given above findings. 2. Mild increased T2 signal in the sacrum and coccyx. Given recent history of trauma this is nonspecific, osteomyelitis or posttraumatic changes are considered. Given proximity of fluid osteomyelitis is favored at this time. 3. Diffuse body wall edema. 4. Post hysterectomy. Electronically Signed   By: Zetta Bills M.D.   On: 12/31/2018 09:49   CT ABDOMEN PELVIS W CONTRAST  Result Date: 12/27/2018 CLINICAL DATA:  Anal/rectal abscess EXAM: CT ABDOMEN AND PELVIS WITH CONTRAST TECHNIQUE: Multidetector CT imaging of the abdomen and pelvis was performed using the standard protocol following bolus administration of intravenous contrast. CONTRAST:  111mL OMNIPAQUE IOHEXOL 300 MG/ML  SOLN COMPARISON:  12/25/2018 FINDINGS: Lower chest: No acute abnormality Hepatobiliary: No focal liver abnormality is seen. Status post cholecystectomy. No biliary dilatation. Pancreas: No focal abnormality or ductal dilatation. Spleen: No focal abnormality.  Normal size. Adrenals/Urinary Tract: No adrenal abnormality. No focal renal abnormality. No stones or hydronephrosis. Urinary bladder is unremarkable. Stomach/Bowel: Stomach, large and small bowel grossly unremarkable. Vascular/Lymphatic: Aortoiliac atherosclerosis. No aneurysm or adenopathy. Reproductive: Prior hysterectomy.  No adnexal masses. Other: No free fluid or free air. Again noted is inflammation/stranding in the superior gluteal cleft. Enlarging subcutaneous fluid collection noted in the left medial buttock measuring 2.9 x 1.9 cm on image 92 of series 5. Musculoskeletal: No acute bony abnormality. IMPRESSION: Continued stranding/inflammation in the gluteal subcutaneous soft tissues at the superior gluteal cleft with enlarging fluid collection in the medial left  buttock subcutaneous soft tissues measuring 2.9 x 1.9 cm. Aortoiliac atherosclerosis. Prior cholecystectomy and hysterectomy. Electronically Signed   By: Rolm Baptise M.D.   On: 12/27/2018 17:47   PERIPHERAL VASCULAR CATHETERIZATION  Result Date: 12/14/2018  Patient name: JACQUELYN SHADRICK MRN: [id number]        DOB: Feb 18, 1970            Sex: female  12/14/2018 Pre-operative Diagnosis: Bilateral lower extremity lifestyle limiting short distance claudication Post-operative diagnosis:  Same Surgeon:  [name], MD Procedure Performed: 1.  Ultrasound-guided access of the right common femoral artery 2.  Aortogram with bilateral lower extremity arteriogram 3.  Left SFA angioplasty (4 mm x 150 mm Mustang) 4.  Left SFA stent placement (5 mm x 150 mm self-expanding Innova) 5.  49 minutes of monitored moderate conscious sedation time 6.  Mynx closure of the right common femoral artery  Indications: Patient is a 48 year old female that was seen in clinic as a referral from nephrology for evaluation of lower extremity arterial disease.  Ultimately her symptoms were consistent with claudication progressing to short distance lifestyle limiting claudication and noninvasive imaging showed evidence of SFA disease with monophasic runoff at the ankles in both lower extremities.  Attempted to recommend conservative management initially but patient stated that her symptoms are progressing.  She presents today for left lower extremity intervention after risk and benefits were discussed.  Findings:  Aortogram showed a fairly small infrarenal aorta that was moderately diseased but no overt flow-limiting stenosis.  The right common and external iliac are widely patent although small.  The left common iliac is widely patent and the left external iliac has a proximal 50% stenosis.  I elected not to treat the left iliac given excellent left femoral pulse.  The left lower extremity arteriogram showed a patent common femoral and  profunda.  She has a fairly diseased SFA in the mid segment with approximately 150 mm segment with greater than 90 to 95% stenosis.  Patient had a patent above and below-knee popliteal artery.  She has dominant runoff in the left lower extremity via the posterior tibial into the ankle.  The anterior tibial and peroneal were patent proximal but appeared to occlude in the distal calf.  There was reconstitution of a very diminutive dorsalis pedis.  Right lower extremity arteriogram shows a patent common femoral and profunda and again a fairly long diseased segment of SFA with flow-limiting stenosis.  She has a patent above and below-knee popliteal artery.  Dominant runoff in the right lower extremity appears to be the posterior tibial.             Procedure:  The patient was identified in the holding area and taken to room 8.  The patient was then placed supine on the table and prepped and draped in the usual sterile fashion.  A time out was called.  Ultrasound was used to evaluate the right common femoral artery.  It was patent .  A digital ultrasound image was acquired.  A micropuncture needle was used to access the right common femoral artery under ultrasound guidance.  An 018 wire was advanced without resistance and a micropuncture sheath was placed.  The 018 wire was removed and a benson wire was placed.  The micropuncture sheath was exchanged for a 5 french sheath.  An omniflush catheter was advanced over the wire to the level of L-1.  An abdominal angiogram was obtained.  Next the catheter was pulled down and bilateral lower extremity runoff was obtained.  Pertinent findings are noted above.  Given plans to intervene on the left leg first since it was more symptomatic, I used a Glidewire advantage to cross the aortic  bifurcation with a Omni Flush catheter and selected the left SFA.  I then upsized to a long 6 [place] Ansell sheath in the right common femoral artery over the aortic bifurcation and the distal  sheath was parked in the left SFA.  Patient was given 100 units/kg heparin.  I then used a Glidewire advantage with a quick cross catheter to cross the SFA lesion into the above-knee popliteal artery.  We did perform hand-injection through the quick cross to confirm we were in the true lumen.  We got additional planning shot from the sheath.  Ultimately selected a 4 mm x 150 mm Mustang to angioplasty the mid SFA lesion throughout its segment.  The balloon was inflated to nominal pressure for 2 minutes.  We shot another arteriogram through the sheath and there was a dissection.  I felt like this had to be stented given the extent of dissection and did not feel like a drug-coated balloon would be acceptable.  I did select a 5 mm x 150 mm self-expanding Innova.  This was placed across the SFA diseased segment where we had angioplastied.  Once the stent was deployed I then used a 4 mm Mustang and overinflated this to 24 mmHg to get full expansion of the stent.  One final injection showed inline flow down the SFA with no residual stenosis.  There was preserved runoff in the lower extremity with patent trifurcation and dominant runoff into the foot via posterior tibial artery.  That point time wires and catheters were removed and exchanged for a short 6 French sheath in the right groin.  Mynx closure device was deployed and additional pressure was held for 5 minutes.  Patient was taken to PACU in stable condition.     [name], MD [facility] Office: [phone] Pager: [phone]  [name]  MYOCARDIAL PERFUSION IMAGING  Result Date: 12/21/2018  Nuclear stress EF: 44%. The left ventricular ejection fraction is moderately decreased (30-44%).  There was no ST segment deviation noted during stress.  This is a low risk study. There is no evidence of ischemia or previous myocardial infarction.  The study is normal.    ECHOCARDIOGRAM COMPLETE  Result  Date: 12/22/2018   ECHOCARDIOGRAM REPORT   Patient Name:   VAUDINE DUTAN Date of Exam: 12/22/2018 Medical Rec #:  [id number]     Height:       65.0 in Accession #:    [id number]    Weight:       163.0 lb Date of Birth:  05-Jun-1970      BSA:          1.81 m Patient Age:    44 years      BP:           142/68 mmHg Patient Gender: F             HR:           114 bpm. Exam Location:  [place] Procedure: 2D Echo, Cardiac Doppler and Color Doppler Indications:    R06.02 SOB; I40.9 Acute myocarditis  History:        Patient has prior history of Echocardiogram examinations, most                 recent 06/19/2017. CHF, CAD, Signs/Symptoms:Chest Pain; Risk                 Factors:Hypertension, Diabetes and Dyslipidemia. Chronic kidney  disease. End stage renal disease. PVD.  Sonographer:    Diamond Nickel RCS Referring Phys: [place]  1. Left ventricular ejection fraction, by visual estimation, is 60 to 65%. The left ventricle has normal function. There is mildly increased left ventricular hypertrophy.  2. The left ventricle has no regional wall motion abnormalities.  3. Global right ventricle has normal systolic function.The right ventricular size is normal. No increase in right ventricular wall thickness.  4. Left atrial size was normal.  5. Right atrial size was normal.  6. The mitral valve is normal in structure. Trivial mitral valve regurgitation. No evidence of mitral stenosis.  7. The tricuspid valve is normal in structure. Tricuspid valve regurgitation is mild.  8. The aortic valve is tricuspid. Aortic valve regurgitation is trivial. Mild aortic valve sclerosis without stenosis.  9. The pulmonic valve was grossly normal. Pulmonic valve regurgitation is trivial. 10. The inferior vena cava is normal in size with greater than 50% respiratory variability, suggesting right atrial pressure of 3 mmHg. FINDINGS  Left Ventricle: Left ventricular ejection fraction, by visual estimation,  is 60 to 65%. The left ventricle has normal function. The left ventricle has no regional wall motion abnormalities. There is mildly increased left ventricular hypertrophy. Normal left atrial pressure. Right Ventricle: The right ventricular size is normal. No increase in right ventricular wall thickness. Global RV systolic function is has normal systolic function. Left Atrium: Left atrial size was normal in size. Right Atrium: Right atrial size was normal in size Pericardium: There is no evidence of pericardial effusion. Mitral Valve: The mitral valve is normal in structure. Trivial mitral valve regurgitation. No evidence of mitral valve stenosis by observation. Tricuspid Valve: The tricuspid valve is normal in structure. Tricuspid valve regurgitation is mild. Aortic Valve: The aortic valve is tricuspid. Aortic valve regurgitation is trivial. Mild aortic valve sclerosis is present, with no evidence of aortic valve stenosis. Pulmonic Valve: The pulmonic valve was grossly normal. Pulmonic valve regurgitation is trivial. Pulmonic regurgitation is trivial. Aorta: The aortic root, ascending aorta and aortic arch are all structurally normal, with no evidence of dilitation or obstruction. Venous: The inferior vena cava is normal in size with greater than 50% respiratory variability, suggesting right atrial pressure of 3 mmHg. IAS/Shunts: No atrial level shunt detected by color flow Doppler. There is no evidence of a patent foramen ovale. No ventricular septal defect is seen or detected. There is no evidence of an atrial septal defect.  LEFT VENTRICLE PLAX 2D LVIDd:         3.70 cm LVIDs:         2.60 cm LV PW:         1.60 cm LV IVS:        1.30 cm LVOT diam:     2.05 cm LV SV:         34 ml LV SV Index:   18.04 LVOT Area:     3.30 cm  RIGHT VENTRICLE RV S prime:     10.90 cm/s TAPSE (M-mode): 2.4 cm LEFT ATRIUM             Index       RIGHT ATRIUM          Index LA diam:        3.10 cm 1.71 cm/m  RA Area:     9.84 cm LA  Vol (A2C):   37.6 ml 20.73 ml/m RA Volume:   20.60 ml 11.36 ml/m LA Vol (  A4C):   33.4 ml 18.42 ml/m LA Biplane Vol: 36.8 ml 20.29 ml/m  AORTIC VALVE LVOT Vmax:   107.33 cm/s LVOT Vmean:  64.867 cm/s LVOT VTI:    0.185 m  AORTA Ao Root diam: 3.20 cm  SHUNTS Systemic VTI:  0.18 m Systemic Diam: 2.05 cm  Jenkins Rouge MD Electronically signed by Jenkins Rouge MD Signature Date/Time: 12/22/2018/4:51:37 PM    Final     Labs:  CBC: Recent Labs    12/29/18 0454 12/30/18 0358 12/31/18 0642 01/01/19 0416  WBC 41.6*   42.4* 49.4* 47.8* 45.5*  HGB 9.2*   9.2* 8.1* 7.5* 6.8*  HCT 29.9*   30.0* 26.5* 23.7* 21.3*  PLT 490*   499* 564* 518* 522*    COAGS: No results for input(s): INR, APTT in the last 8760 hours.  BMP: Recent Labs    12/29/18 0454 12/30/18 0358 12/31/18 0642 01/01/19 0416  NA 132* 129* 127* 128*  K 3.7 3.1* 3.2* 3.3*  CL 94* 91* 90* 89*  CO2 24 23 22 24   GLUCOSE 204* 144* 110* 98  BUN 46* 48* 51* 53*  CALCIUM 8.2* 8.4* 8.5* 8.3*  CREATININE 8.66* 9.18* 9.10* 8.57*  GFRNONAA 5* 5* 5* 5*  GFRAA 6* 5* 5* 6*    LIVER FUNCTION TESTS: Recent Labs    09/16/18 1650 12/25/18 1115 12/26/18 1047  BILITOT 0.4 0.8  --   AST 11* 14*  --   ALT 11 15  --   ALKPHOS 83 122  --   PROT 6.5 7.2  --   ALBUMIN 2.7* 2.4* 2.0*    TUMOR MARKERS: No results for input(s): AFPTM, CEA, CA199, CHROMGRNA in the last 8760 hours.  Assessment and Plan:  Peri sacral collection Possible secondary to fall at home Now for aspiration in IR CCS asking to determine if infection-- may need OR Risks and benefits of peri sacral collection aspiration was discussed with the patient and/or patient's family including, but not limited to bleeding, infection, damage to adjacent structures or low yield requiring additional tests.  All of the questions were answered and there is agreement to proceed.  Consent signed and in chart.    Thank you for this interesting consult.  I greatly enjoyed  meeting MEHAR SAGEN and look forward to participating in their care.  A copy of this report was sent to the requesting provider on this date.  Electronically Signed: Lavonia Drafts, PA-C 01/01/2019, 11:19 AM   I spent a total of 20 Minutes    in face to face in clinical consultation, greater than 50% of which was counseling/coordinating care for peri sacral collection aspiration

## 2019-01-01 NOTE — Progress Notes (Signed)
FPTS Interim Progress Note  Paged general surgery regarding recommendation of aspiration perisacral fluid.  Spoke with PA [employer].  She spoke with attending physician who recommended IR consultation for ultrasound-guided drainage since aspiration was unsuccessful on last attempt.  She notes that surgery will continue to follow in case patient needs washout in OR.  Appreciate surgery recommendations and will consult IR for ultrasound-guided aspiration.  Henning, DO 01/01/2019, 8:46 AM PGY-2, [facility] pager [phone]

## 2019-01-02 DIAGNOSIS — D649 Anemia, unspecified: Secondary | ICD-10-CM

## 2019-01-02 DIAGNOSIS — K659 Peritonitis, unspecified: Secondary | ICD-10-CM

## 2019-01-02 DIAGNOSIS — M4628 Osteomyelitis of vertebra, sacral and sacrococcygeal region: Secondary | ICD-10-CM

## 2019-01-02 LAB — CBC
HCT: 24.3 % — ABNORMAL LOW (ref 36.0–46.0)
HCT: 26.8 % — ABNORMAL LOW (ref 36.0–46.0)
Hemoglobin: 8.1 g/dL — ABNORMAL LOW (ref 12.0–15.0)
Hemoglobin: 8.6 g/dL — ABNORMAL LOW (ref 12.0–15.0)
MCH: 27.1 pg (ref 26.0–34.0)
MCH: 27 pg (ref 26.0–34.0)
MCHC: 33.3 g/dL (ref 30.0–36.0)
MCHC: 32.1 g/dL (ref 30.0–36.0)
MCV: 81.3 fL (ref 80.0–100.0)
MCV: 84 fL (ref 80.0–100.0)
Platelets: 447 10*3/uL — ABNORMAL HIGH (ref 150–400)
Platelets: 493 10*3/uL — ABNORMAL HIGH (ref 150–400)
RBC: 2.99 MIL/uL — ABNORMAL LOW (ref 3.87–5.11)
RBC: 3.19 MIL/uL — ABNORMAL LOW (ref 3.87–5.11)
RDW: 14 % (ref 11.5–15.5)
RDW: 14.3 % (ref 11.5–15.5)
WBC: 41.7 10*3/uL — ABNORMAL HIGH (ref 4.0–10.5)
WBC: 41.6 10*3/uL — ABNORMAL HIGH (ref 4.0–10.5)
nRBC: 0 % (ref 0.0–0.2)
nRBC: 0 % (ref 0.0–0.2)

## 2019-01-02 LAB — TYPE AND SCREEN
ABO/RH(D): O POS
Antibody Screen: NEGATIVE
Unit division: 0

## 2019-01-02 LAB — GLUCOSE, CAPILLARY
Glucose-Capillary: 106 mg/dL — ABNORMAL HIGH (ref 70–99)
Glucose-Capillary: 84 mg/dL (ref 70–99)
Glucose-Capillary: 102 mg/dL — ABNORMAL HIGH (ref 70–99)
Glucose-Capillary: 175 mg/dL — ABNORMAL HIGH (ref 70–99)

## 2019-01-02 LAB — BASIC METABOLIC PANEL
Anion gap: 17 — ABNORMAL HIGH (ref 5–15)
Anion gap: 17 — ABNORMAL HIGH (ref 5–15)
BUN: 52 mg/dL — ABNORMAL HIGH (ref 6–20)
BUN: 55 mg/dL — ABNORMAL HIGH (ref 6–20)
CO2: 22 mmol/L (ref 22–32)
CO2: 24 mmol/L (ref 22–32)
Calcium: 8.2 mg/dL — ABNORMAL LOW (ref 8.9–10.3)
Calcium: 8.6 mg/dL — ABNORMAL LOW (ref 8.9–10.3)
Chloride: 88 mmol/L — ABNORMAL LOW (ref 98–111)
Chloride: 87 mmol/L — ABNORMAL LOW (ref 98–111)
Creatinine, Ser: 8.27 mg/dL — ABNORMAL HIGH (ref 0.44–1.00)
Creatinine, Ser: 8.58 mg/dL — ABNORMAL HIGH (ref 0.44–1.00)
GFR calc Af Amer: 6 mL/min — ABNORMAL LOW (ref >60)
GFR calc Af Amer: 6 mL/min — ABNORMAL LOW (ref >60)
GFR calc non Af Amer: 5 mL/min — ABNORMAL LOW (ref >60)
GFR calc non Af Amer: 5 mL/min — ABNORMAL LOW (ref >60)
Glucose, Bld: 134 mg/dL — ABNORMAL HIGH (ref 70–99)
Glucose, Bld: 80 mg/dL (ref 70–99)
Potassium: 2.9 mmol/L — ABNORMAL LOW (ref 3.5–5.1)
Potassium: 2.9 mmol/L — ABNORMAL LOW (ref 3.5–5.1)
Sodium: 127 mmol/L — ABNORMAL LOW (ref 135–145)
Sodium: 128 mmol/L — ABNORMAL LOW (ref 135–145)

## 2019-01-02 LAB — BPAM RBC
Blood Product Expiration Date: 202101152359
ISSUE DATE / TIME: 202012211047
Unit Type and Rh: 5100

## 2019-01-02 MED ORDER — HEPARIN 1000 UNIT/ML FOR PERITONEAL DIALYSIS
INTRAPERITONEAL | Status: DC | PRN
  Filled 2019-01-02 (×3): qty 5000

## 2019-01-02 MED ORDER — HEPARIN 1000 UNIT/ML FOR PERITONEAL DIALYSIS
INTRAPERITONEAL | Status: DC | PRN
  Filled 2019-01-02: qty 5000

## 2019-01-02 MED ORDER — HEPARIN 1000 UNIT/ML FOR PERITONEAL DIALYSIS: 500.0000 [IU] | INTRAMUSCULAR | Status: DC | PRN

## 2019-01-02 MED ORDER — POTASSIUM CHLORIDE 10 MEQ/100ML IV SOLN
10.0000 meq | INTRAVENOUS | Status: AC
  Administered 2019-01-02 (×2): 10 meq via INTRAVENOUS
  Filled 2019-01-02 (×2): qty 100

## 2019-01-02 MED ORDER — INSULIN GLARGINE 100 UNIT/ML ~~LOC~~ SOLN
8.0000 [IU] | Freq: Every day | SUBCUTANEOUS | Status: DC
  Administered 2019-01-03 – 2019-01-04 (×3): 8 [IU] via SUBCUTANEOUS
  Filled 2019-01-02 (×5): qty 0.08

## 2019-01-02 MED ORDER — OXYCODONE HCL 5 MG PO TABS
5.0000 mg | ORAL_TABLET | ORAL | Status: DC | PRN
  Administered 2019-01-02 – 2019-01-05 (×8): 5 mg via ORAL
  Filled 2019-01-02 (×9): qty 1

## 2019-01-02 MED ORDER — LINEZOLID 600 MG PO TABS
600.0000 mg | ORAL_TABLET | Freq: Two times a day (BID) | ORAL | Status: DC
  Administered 2019-01-03 – 2019-01-05 (×5): 600 mg via ORAL
  Filled 2019-01-02 (×7): qty 1

## 2019-01-02 MED ORDER — GENTAMICIN SULFATE 0.1 % EX CREA
1.0000 "application " | TOPICAL_CREAM | Freq: Every day | CUTANEOUS | Status: DC
  Administered 2019-01-02: 1 via TOPICAL
  Filled 2019-01-02: qty 15

## 2019-01-02 MED ORDER — POLYETHYLENE GLYCOL 3350 17 G PO PACK
17.0000 g | PACK | Freq: Every day | ORAL | Status: DC
  Administered 2019-01-02 – 2019-01-03 (×2): 17 g via ORAL
  Filled 2019-01-02 (×3): qty 1

## 2019-01-02 NOTE — Progress Notes (Signed)
[facility]  Date of Admission:  12/25/2018      Total days of antibiotics 9  Day 5 linezolid          ASSESSMENT: She is having some slow improvement on antibiotic therapy for presacral phlegmon. There are ?regarding pathology of her fluid collection. Her aspirate may reflect fat necrosis / infected hematoma; not overtly purulent with aspirate and culture not growing anything thusfar. However she received broad spectrum antibiotic treatment prior to the aspirate and may contribute to false negative cultures. Would continue linezolid for now; duration is difficult to say at this time. Story seems unlikely for osteomyelitis; may need to re-image after a few weeks.   Surgery has seen her today and recommend no immediate need for OR I&D with improvement of exam; although ultimately may need anoscopy/sigmoidoscopy to determine pathology of abnormalities on CT.     Leukocytosis slow to improve. No fevers noted.    PLAN: 1. Continue linezolid for now - can change to PO  2. Would anticipate having to add back anaerobic/gram negative coverage given location soon   Active Problems:   Uncontrolled type 2 diabetes mellitus with peripheral neuropathy (HCC)   Essential hypertension, benign   CAD S/P mLAD PCI with DES   Presence of drug coated stent in LAD coronary artery   Elevated troponin   ESRD on dialysis (HCC)   Symptomatic anemia   Gluteal abscess   . sodium chloride   Intravenous Once  . acetaminophen  650 mg Oral Q6H  . amitriptyline  10 mg Oral QHS  . calcitRIOL  1.5 mcg Oral Daily  . [START ON 01/03/2019] darbepoetin (ARANESP) injection - DIALYSIS  100 mcg Subcutaneous Q Wed-1800  . gentamicin cream  1 application Topical Daily  . heparin  5,000 Units Subcutaneous Q8H  . insulin aspart  0-6 Units Subcutaneous TID WC  . insulin glargine  8 Units Subcutaneous QHS  . metoCLOPramide  5 mg Oral TID AC & HS  . metoprolol succinate  50 mg Oral  Daily  . mometasone-formoterol  2 puff Inhalation BID  . oxyCODONE  10 mg Oral Q6H  . pantoprazole  40 mg Oral Daily  . polyethylene glycol  17 g Oral Daily  . pravastatin  20 mg Oral q1800  . pregabalin  75 mg Oral Daily  . senna-docusate  1 tablet Oral Daily    SUBJECTIVE: Ongoing gluteal pain.  Cultures from 12/21 are still pending - ?fat necrosis vs infection.    Review of Systems: Review of Systems  Constitutional: Negative for chills and fever.  HENT: Negative for tinnitus.   Eyes: Negative for blurred vision and photophobia.  Respiratory: Negative for cough and sputum production.   Cardiovascular: Negative for chest pain.  Gastrointestinal: Negative for diarrhea, nausea and vomiting.       Gluteal pain   Genitourinary: Negative for dysuria.  Musculoskeletal: Negative for myalgias.  Skin: Negative for rash.  Neurological: Negative for focal weakness and headaches.    Allergies  Allergen Reactions  . Repatha [Evolocumab] Itching  . Lisinopril Cough    OBJECTIVE: Vitals:   01/01/19 2105 01/02/19 0610 01/02/19 0715 01/02/19 0931  BP: 137/66 126/69 (!) 140/55   Pulse: 84 85 90   Resp: 17 17 16    Temp: 97.7 F (36.5 C) 98 F (36.7 C) 98.6 F (37 C)   TempSrc: Oral  Oral   SpO2: 97% 99% 98%   Weight:  81.1 kg 81.2 kg  Height:       Body mass index is 29.79 kg/m.   Physical Exam Vitals reviewed.  Constitutional:      Appearance: She is well-developed.  HENT:     Mouth/Throat:     Mouth: No oral lesions.     Dentition: Normal dentition. No dental abscesses.     Pharynx: No oropharyngeal exudate.  Cardiovascular:     Rate and Rhythm: Normal rate and regular rhythm.     Heart sounds: Normal heart sounds.  Pulmonary:     Effort: Pulmonary effort is normal.     Breath sounds: Normal breath sounds.  Abdominal:     General: There is no distension.     Palpations: Abdomen is soft.     Tenderness: There is no abdominal tenderness.  Lymphadenopathy:      Cervical: No cervical adenopathy.  Skin:    General: Skin is warm and dry.     Findings: No rash.  Neurological:     Mental Status: She is alert and oriented to person, place, and time.  Psychiatric:        Judgment: Judgment normal.     Lab Results Lab Results  Component Value Date   WBC 41.6 (H) 01/02/2019   HGB 8.6 (L) 01/02/2019   HCT 26.8 (L) 01/02/2019   MCV 84.0 01/02/2019   PLT 493 (H) 01/02/2019    Lab Results  Component Value Date   CREATININE 8.58 (H) 01/02/2019   BUN 55 (H) 01/02/2019   NA 128 (L) 01/02/2019   K 2.9 (L) 01/02/2019   CL 87 (L) 01/02/2019   CO2 24 01/02/2019    Lab Results  Component Value Date   ALT 15 12/25/2018   AST 14 (L) 12/25/2018   ALKPHOS 122 12/25/2018   BILITOT 0.8 12/25/2018     Microbiology: Recent Results (from the past 240 hour(s))  Culture, blood (routine x 2)     Status: None   Collection Time: 12/25/18 12:54 PM   Specimen: BLOOD  Result Value Ref Range Status   Specimen Description BLOOD RIGHT ANTECUBITAL  Final   Special Requests   Final    BOTTLES DRAWN AEROBIC AND ANAEROBIC Blood Culture adequate volume   Culture   Final    NO GROWTH 5 DAYS Performed at [facility], [address], [place], [place] [address]    Report Status 12/30/2018 FINAL  Final  SARS CORONAVIRUS 2 (TAT 6-24 HRS) Nasopharyngeal Nasopharyngeal Swab     Status: None   Collection Time: 12/25/18  5:24 PM   Specimen: Nasopharyngeal Swab  Result Value Ref Range Status   SARS Coronavirus 2 NEGATIVE NEGATIVE Final    Comment: (NOTE) SARS-CoV-2 target nucleic acids are NOT DETECTED. The SARS-CoV-2 RNA is generally detectable in upper and lower respiratory specimens during the acute phase of infection. Negative results do not preclude SARS-CoV-2 infection, do not rule out co-infections with other pathogens, and should not be used as the sole basis for treatment or other patient management decisions. Negative results must be combined with  clinical observations, patient history, and epidemiological information. The expected result is Negative. Fact Sheet for Patients: SugarRoll.be Fact Sheet for Healthcare Providers: https://www.woods-mathews.com/ This test is not yet approved or cleared by the [place] FDA and  has been authorized for detection and/or diagnosis of SARS-CoV-2 by FDA under an Emergency Use Authorization (EUA). This EUA will remain  in effect (meaning this test can be used) for the duration  of the COVID-19 declaration under Section 56 4(b)(1) of the Act, 21 U.S.C. section 360bbb-3(b)(1), unless the authorization is terminated or revoked sooner. Performed at [facility], [place] [address] [place], [place] [address]   Culture, blood (Routine X 2) w Reflex to ID Panel     Status: None   Collection Time: 12/26/18  6:47 AM   Specimen: BLOOD RIGHT HAND  Result Value Ref Range Status   Specimen Description BLOOD RIGHT HAND  Final   Special Requests   Final    BOTTLES DRAWN AEROBIC ONLY Blood Culture results may not be optimal due to an inadequate volume of blood received in culture bottles   Culture   Final    NO GROWTH 5 DAYS Performed at [facility], [place] [address], [place], [place] [address]    Report Status 12/31/2018 FINAL  Final  Body fluid culture     Status: None   Collection Time: 12/26/18  3:03 PM   Specimen: Peritoneal Dialysis; Body Fluid  Result Value Ref Range Status   Specimen Description PERITONEAL DIALYSIS  Final   Special Requests NONE  Final   Gram Stain   Final    RARE WBC PRESENT, PREDOMINANTLY PMN NO ORGANISMS SEEN    Culture   Final    NO GROWTH 3 DAYS Performed at [facility] [address] [place], [place] [address]    Report Status 12/30/2018 FINAL  Final  Culture, fungus without smear     Status: None (Preliminary result)   Collection Time: 01/01/19  4:49 PM   Specimen: PATH Other; Abscess  Result Value  Ref Range Status   Specimen Description ABSCESS SACRAL  Final   Special Requests NONE  Final   Culture   Final    NO GROWTH < 24 HOURS Performed at [facility], [place] [address], [place], [place] [address]    Report Status PENDING  Incomplete  Aerobic/Anaerobic Culture (surgical/deep wound)     Status: None (Preliminary result)   Collection Time: 01/01/19  4:49 PM   Specimen: PATH Other; Abscess  Result Value Ref Range Status   Specimen Description ABSCESS SACRAL  Final   Special Requests NONE  Final   Gram Stain   Final    RARE WBC PRESENT, PREDOMINANTLY PMN NO ORGANISMS SEEN    Culture   Final    NO GROWTH < 24 HOURS Performed at [facility], [place] [address], [place],  [address]    Report Status PENDING  Incomplete    Janene Madeira, MSN, NP-C [facility] [email or web address] Pager: [phone] Office: [phone] [place]: [phone]

## 2019-01-02 NOTE — Progress Notes (Signed)
Subjective: Stable and alert.  No fever.  No tachycardia. Says her gluteal pain is a little bit better.  No drainage  Gluteal cleft fluid collections aspirated by IR yesterday.  2 cc yellowish fluid removed.  Question fat necrosis versus infection. Gram stain negative.  Cultures pending   Objective: Vital signs in last 24 hours: Temp:  [97.7 F (36.5 C)-98.6 F (37 C)] 98 F (36.7 C) (12/22 0610) Pulse Rate:  [81-89] 85 (12/22 0610) Resp:  [12-22] 17 (12/22 0610) BP: (96-137)/(36-84) 126/69 (12/22 0610) SpO2:  [94 %-100 %] 99 % (12/22 0610) Weight:  [81.4 kg] 81.4 kg (12/21 1727) Last BM Date: 01/01/19  Intake/Output from previous day: 12/21 0701 - 12/22 0700 In: 385 [P.O.:60; I.V.:10; Blood:315] Out: -  Intake/Output this shift: No intake/output data recorded.  PE: Gen:  Alert, NAD HEENT: EOM's intact, pupils equal and round Pulm:  Rate and effort normal Abd: Soft, NT/ND, +BS, PD catheter in place without erythema or drainage Skin: warm and dry GU: Bilateral gluteal cleft swelling still present.  Swelling less  Tenderness less.  No significant erythema today.  No fluctuance.  Skin overlying this process is a little wrinkled now suggesting the process is slowly involuting.   Lab Results:  Recent Labs    01/02/19 0227 01/02/19 0701  WBC 41.7* 41.6*  HGB 8.1* 8.6*  HCT 24.3* 26.8*  PLT 447* 493*   BMET Recent Labs    01/01/19 0416 01/02/19 0227  NA 128* 127*  K 3.3* 2.9*  CL 89* 88*  CO2 24 22  GLUCOSE 98 134*  BUN 53* 52*  CREATININE 8.57* 8.27*  CALCIUM 8.3* 8.2*   PT/INR Recent Labs    01/01/19 1032  LABPROT 15.2  INR 1.2   ABG No results for input(s): PHART, HCO3 in the last 72 hours.  Invalid input(s): PCO2, PO2  Studies/Results: MR PELVIS WO CONTRAST  Result Date: 12/31/2018 CLINICAL DATA:  History of abscess about the inner rectal region. EXAM: MRI PELVIS WITHOUT CONTRAST TECHNIQUE: Multiplanar multisequence MR imaging of the  pelvis was performed. No intravenous contrast was administered. COMPARISON:  CT study of 12/25/2018 and 12/27/2018 FINDINGS: Urinary Tract: Urinary bladder is moderately distended without signs of hydronephrosis of the distal ureters. Bowel: Visualized bowel is unremarkable aside from some mild thickening of the distal rectum. Assessment is limited on today's study. Vascular/Lymphatic: Is vascular structures not well assessed given lack of intravenous contrast. Appropriate flow voids are present in the bilateral pelvic vasculature. Reproductive:  Post hysterectomy. Other:  Is diffuse body wall edema. Musculoskeletal: Perisacral and coccygeal fluid collection with complex features measuring 5.2 x 3.8 x 8.9 cm. Edema extends to the levator plate as does fluid. Perirectal edema is also noted on today's study. There is no clear evidence of rectal involvement aside from the edema this changes. Stranding also extends to the posterior margin of the puborectalis muscle and the sphincter complex though the fluid collection does not extend to this level. Mild increased T2 signal in the sacrum and coccyx. Signs of low T2 signal throughout visualized bony structures aside from the last sacral segment in the coccyx. Is Assessment overall is limited due to patient position and discomfort with inability to Ree acquire limited sequences Fluid in stranding extends into the gluteal cleft left greater than right. High signal also involves the inferior margin of the sphincter complex on the left greater than right. IMPRESSION: 1. Peri sacral and coccygeal fluid collection with stranding extending into the levator plate and sphincter  complex. No clear evidence of rectal involvement. Inflammatory changes extend into the left gluteal cleft and also involve the inferior margin of the anal sphincter complex. Anoscopy and or sigmoidoscopy may be helpful for further assessment given above findings. 2. Mild increased T2 signal in the sacrum  and coccyx. Given recent history of trauma this is nonspecific, osteomyelitis or posttraumatic changes are considered. Given proximity of fluid osteomyelitis is favored at this time. 3. Diffuse body wall edema. 4. Post hysterectomy. Electronically Signed   By: Zetta Bills M.D.   On: 12/31/2018 09:49   IR US Guide Bx Asp/Drain  Result Date: 01/01/2019 INDICATION: 48 year old female with a complex peri sacral and coccygeal fluid collection of uncertain etiology. EXAM: Ultrasound-guided aspiration MEDICATIONS: The patient is currently admitted to the hospital and receiving intravenous antibiotics. The antibiotics were administered within an appropriate time frame prior to the initiation of the procedure. ANESTHESIA/SEDATION: Fentanyl 100 mcg IV; Versed 2 mg IV Moderate Sedation Time:  17 minutes The patient was continuously monitored during the procedure by the interventional radiology nurse under my direct supervision. COMPLICATIONS: None immediate. PROCEDURE: Informed written consent was obtained from the patient after a thorough discussion of the procedural risks, benefits and alternatives. All questions were addressed. A timeout was performed prior to the initiation of the procedure. The region of interest was interrogated with ultrasound. There is a very ill-defined and extremely heterogeneous complex cystic collection in the superficial subcutaneous tissues of the gluteal cleft extending into the medial aspect of the buttock bilaterally. A suitable skin entry site was selected and marked. The overlying skin was sterilely prepped and draped in standard fashion using chlorhexidine skin prep. Local anesthesia was attained by infiltration with 1% lidocaine. A small dermatotomy was made. Under real-time sonographic guidance, an 18 gauge trocar needle was advanced into the fluid collection. Aspiration was performed while manipulating the needle into all of the visible portions of the collection. The collection  is extremely thick and may represents upon a fide adipose tissue. Only a trace amount of opaque potentially purulent fluid was successfully aspirated. The volume is perhaps 1-2 mL. The sample was sent for Gram stain and culture. IMPRESSION: 1. Extremely complex fluid collection in the superficial subcutaneous soft tissues of the gluteal cleft extending in the medial aspect of the buttock bilaterally. 2. Aspiration was minimally successful yielding only 1-2 mL of opaque tan potentially purulent fluid which was sent for Gram stain and culture. Differential considerations include panniculitis versus a chronic inflammatory process such as fat necrosis. Signed, Criselda Peaches, MD, [facility] Radiology Electronically Signed   By: Jacqulynn Cadet M.D.   On: 01/01/2019 19:50    Anti-infectives: Anti-infectives (From admission, onward)   Start     Dose/Rate Route Frequency Ordered Stop   01/01/19 1800  fluconazole (DIFLUCAN) IVPB 200 mg  Status:  Discontinued     200 mg 100 mL/hr over 60 Minutes Intravenous Every 24 hours 01/01/19 0820 01/01/19 1204   12/31/18 1500  fluconazole (DIFLUCAN) IVPB 400 mg     400 mg 100 mL/hr over 120 Minutes Intravenous NOW 12/31/18 1413 12/31/18 1715   12/29/18 1400  linezolid (ZYVOX) IVPB 600 mg     600 mg 300 mL/hr over 60 Minutes Intravenous Every 12 hours 12/29/18 1312     12/28/18 1800  ampicillin-sulbactam (UNASYN) 1.5 g in sodium chloride 0.9 % 100 mL IVPB  Status:  Discontinued     1.5 g 200 mL/hr over 30 Minutes  Intravenous Every 12 hours 12/28/18 1429 12/29/18 1311   12/28/18 1230  amoxicillin (AMOXIL) capsule 500 mg     500 mg Oral  Once 12/28/18 1218 12/28/18 1253   12/28/18 1115  penicillin G in sodium chloride 10,000 units/mL syringe for skin test - 1st bleb     1,000 Units Intradermal  Once 12/28/18 1034 12/28/18 1130   12/28/18 1115  penicillin G in sodium chloride 10,000 units/mL syringe for  skin test - 2nd bleb     1,000 Units Intradermal  Once 12/28/18 1034 12/28/18 1130   12/28/18 1100  penicillin G in sodium chloride 10,000 units/mL syringe for skin test     1,000 Units Topical  Once 12/28/18 1034 12/28/18 1130   12/28/18 1030  penicillin G in sodium chloride 10,000 units/mL syringe for skin test  Status:  Discontinued     1,000 Units Topical  Once 12/28/18 1018 12/28/18 1034   12/28/18 1030  penicillin G in sodium chloride 10,000 units/mL syringe for skin test - 1st bleb  Status:  Discontinued     1,000 Units Intradermal  Once 12/28/18 1018 12/28/18 1034   12/28/18 1030  penicillin G in sodium chloride 10,000 units/mL syringe for skin test - 2nd bleb  Status:  Discontinued     1,000 Units Intradermal  Once 12/28/18 1018 12/28/18 1034   12/26/18 0130  aztreonam (AZACTAM) 0.5 g in dextrose 5 % 50 mL IVPB  Status:  Discontinued     0.5 g 100 mL/hr over 30 Minutes Intravenous Every 8 hours 12/25/18 1858 12/28/18 1429   12/26/18 0000  vancomycin variable dose per unstable renal function (pharmacist dosing)  Status:  Discontinued      Does not apply See admin instructions 12/25/18 1634 12/28/18 1139   12/25/18 2200  aztreonam (AZACTAM) 0.5 g in dextrose 5 % 50 mL IVPB  Status:  Discontinued     0.5 g 100 mL/hr over 30 Minutes Intravenous Every 12 hours 12/25/18 1634 12/25/18 1646   12/25/18 1900  metroNIDAZOLE (FLAGYL) IVPB 500 mg  Status:  Discontinued     500 mg 100 mL/hr over 60 Minutes Intravenous Every 8 hours 12/25/18 1858 12/29/18 1327   12/25/18 1700  vancomycin (VANCOCIN) 2,000 mg in sodium chloride 0.9 % 500 mL IVPB  Status:  Discontinued     2,000 mg 250 mL/hr over 120 Minutes Intravenous  Once 12/25/18 1648 12/25/18 2030   12/25/18 1646  aztreonam (AZACTAM) 0.5 g in dextrose 5 % 50 mL IVPB  Status:  Discontinued     0.5 g 100 mL/hr over 30 Minutes Intravenous Every 8 hours 12/25/18 1646 12/25/18 1851   12/25/18 1645  vancomycin (VANCOCIN) IVPB 1000 mg/200 mL premix   Status:  Discontinued     1,000 mg 200 mL/hr over 60 Minutes Intravenous  Once 12/25/18 1631 12/25/18 1634   12/25/18 1645  aztreonam (AZACTAM) 2 g in sodium chloride 0.9 % 100 mL IVPB  Status:  Discontinued     2 g 200 mL/hr over 30 Minutes Intravenous  Once 12/25/18 1631 12/25/18 1634   12/25/18 1645  vancomycin (VANCOCIN) IVPB 1000 mg/200 mL premix  Status:  Discontinued     1,000 mg 200 mL/hr over 60 Minutes Intravenous  Once 12/25/18 1632 12/25/18 1634   12/25/18 1645  aztreonam (AZACTAM) 1 g in sodium chloride 0.9 % 100 mL IVPB  Status:  Discontinued     1 g 200 mL/hr over 30 Minutes Intravenous  Once 12/25/18 1632 12/25/18 1634  12/25/18 1645  vancomycin (VANCOCIN) 1,500 mg in sodium chloride 0.9 % 500 mL IVPB  Status:  Discontinued     1,500 mg 250 mL/hr over 120 Minutes Intravenous  Once 12/25/18 1634 12/25/18 1648      Assessment/Plan:    ESRD on PD CAD s/p mLAD PCI with DES - Plavix on hold PVD HTN DM type II  30F s/p mechanical ground level falls Presacral fluid collection -CT12/14w/2 x 1 x 1 cmphlegmon on CT scan that might an infected hematoma after GLF on 12/9. - CT 12/16 w/enlarging fluid collection in the medial left buttock subcutaneous soft tissues measuring 2.9 x 1.9 cm - s/p bedside aspiration 12/17, aspirate appeared to be old blood - MRI 12/20 showed 5.2 x 3.8 x 8.9 cm perisacral and coccygeal fluid collection with complex features. ID requesting re-aspiration and send for gram stain, aerobic and anaerobic culture -IR aspirated perisacral fluid collection yesterday.  Impression was fat necrosis versus infection.  Not purulent.  Gram stain negative.  Culture pending -Clinically the gluteal cleft process appears to be improving, and I do not think there is any immediate need to take to the OR for incision drainage and debridement.  This may be a self-limited process with antibiotics and sitz bath's  -Leukocytosis 40,000 is unexplained.  This does not  correlate with the soft tissue process in her gluteal cleft.  Her abdomen is also benign and does not suggest active peritonitis.  FEN -Renal diet, NPO after MN VTE -sq Heparin  ID -Aztreonam/Flagyl/Vanc 12/14 >>. Per ID  Plan: Continue antibiotics.  Sitz baths 3 times daily.  No indication for operative debridement at this point.  Check cultures      LOS: 7 days    Adin Hector 01/02/2019

## 2019-01-02 NOTE — Progress Notes (Addendum)
I have seen and examined this patient and agree with the plan of care  Sherril Croon 01/02/2019, 11:53 AM  [facility] Progress Note   Dialysis Orders: CCPD - 1 2.5 5 L bag and 1 1.5 5 L bag - 5 fills - 2 L fills with 1 L in last fill dwell 1.5 hr  Assessment/Plan: 1. Sacaral/coccygeal/gluteal abscess -Surgery aspirated sacral fluidagain 12/22small amount tan fluid - neg gram stain ID consulting. MRI showed possible osteomyelitis.  ID consulting. IR to do aspiration tomorrow 2. Leukocytosis -WBC 25 >33 K >38.9>41.6>49.4>47.8> 45.> 41.6 5 Gluteal abscess vs leukocytosis. ID consulting. On linezolid.possibly  due to OM. Does not have any symptoms of perotinits 2. Gastroparesis - chronic problem -on chronic Reglan 5 mg.  3.Peritonitis - cell count 333 - with 95% neutrophils - culture sentwith no organisms on gram stain.No abdominal pain.Per radiology eval of 12/14 CT scan there is no communication of the abscess with the peritoneum  4. ESRDwith  hypokalemia - blood today will offer some K- on PD- continue 1.5% solution. Has an AVF so could easily transition to HD if she had surgery for abscess that might warrant a holiday from PDor if PD catheter had to be removed.No communication with peritoneum on 12/14 scan per radiology.PD cath clotting, improved using heparinized solution. Na low and wts creeping up - continue 1/2 1.5 and 1/2 2.5 this evening only- currently  No daily time dwells.  - K lower today at 2.9 - labs drawn in the middle of PD early am- defer repletion of K to teaching service 5. Anemia- hgb 10.3 >8.7>9.2>7.5> 6.8 held oral Fe to lessen pill burden with gastroparesis - increase Aranesp 170mcg qwk - has had in the past here - gets Mircera as outpatient (last dose in Sept) For one unit PRBC 6. Secondary hyperparathyroidism- Ca 8.5 on 3 Ca acetate ac - was heldto lessen pill burdenand with nausea- resume at d/c - continue calcitriol.Will add  on P to BMP labs today 7.HTN/volume- BP variable-well controlled today. Does not appear volume overloaded. On Toprol Net UF only 119 -  8 . DM- per primary 9. Nutrition - alb low -liberalized diet to regular to augment potassium intake and increase options 10. Constipation - has senokot - add daily miralax  Myriam Jacobson, PA-C [place] [phone] 01/02/2019,8:30 AM  LOS: 7 days   Subjective:   Denies problems with PD - up moving in room. Pain a little better. Dr. Dalbert Batman in and continue to watchful wait - neg gram stain on aspiration yesterday.  Said she had bladder scan with little urine - still not much BMs. Slowly improving  Objective Vitals:   01/01/19 1727 01/01/19 2001 01/01/19 2105 01/02/19 0610  BP:   137/66 126/69  Pulse:   84 85  Resp:   17 17  Temp:   97.7 F (36.5 C) 98 F (36.7 C)  TempSrc:   Oral   SpO2:  100% 97% 99%  Weight: 81.4 kg     Height:       Physical Exam General: NAD Heart: RRR Lungs: no rales Abdomen: soft but distended - nontender Extremities: no LE edema Dialysis Access: PD cath intact   Additional Objective Labs: Basic Metabolic Panel: Recent Labs  Lab 12/26/18 1047 12/31/18 0642 01/01/19 0416 01/02/19 0227  NA 134* 127* 128* 127*  K 4.5 3.2* 3.3* 2.9*  CL 101 90* 89* 88*  CO2 18* 22 24 22   GLUCOSE 217* 110* 98  134*  BUN 59* 51* 53* 52*  CREATININE 11.09* 9.10* 8.57* 8.27*  CALCIUM 8.5* 8.5* 8.3* 8.2*  PHOS 5.7*  --  6.1*  --    Liver Function Tests: Recent Labs  Lab 12/26/18 1047  ALBUMIN 2.0*   No results for input(s): LIPASE, AMYLASE in the last 168 hours. CBC: Recent Labs  Lab 12/26/18 1047 12/29/18 0454 12/30/18 0358 12/31/18 0642 01/01/19 0416 01/01/19 1855 01/02/19 0227 01/02/19 0701  WBC 33.2* 41.6*  42.4* 49.4* 47.8* 45.5*  --  41.7* 41.6*  NEUTROABS 30.2* 36.3*  --   --   --   --   --   --   HGB 8.9* 9.2*  9.2* 8.1* 7.5* 6.8* 10.1* 8.1* 8.6*  HCT 28.8* 29.9*  30.0*  26.5* 23.7* 21.3* 31.2* 24.3* 26.8*  MCV 85.0 86.2  86.2 86.3 84.3 83.5  --  81.3 84.0  PLT 425* 490*  499* 564* 518* 522*  --  447* 493*   Blood Culture    Component Value Date/Time   SDES ABSCESS SACRAL 01/01/2019 1649   SPECREQUEST NONE 01/01/2019 1649   CULT PENDING 01/01/2019 1649   REPTSTATUS PENDING 01/01/2019 1649    Cardiac Enzymes: No results for input(s): CKTOTAL, CKMB, CKMBINDEX, TROPONINI in the last 168 hours. CBG: Recent Labs  Lab 01/01/19 1233 01/01/19 1736 01/01/19 1822 01/01/19 2107 01/02/19 0752  GLUCAP 80 59* 86 127* 106*   Iron Studies: No results for input(s): IRON, TIBC, TRANSFERRIN, FERRITIN in the last 72 hours. Lab Results  Component Value Date   INR 1.2 01/01/2019   INR 0.91 02/01/2017   INR 0.9 07/26/2016   Studies/Results: MR PELVIS WO CONTRAST  Result Date: 12/31/2018 CLINICAL DATA:  History of abscess about the inner rectal region. EXAM: MRI PELVIS WITHOUT CONTRAST TECHNIQUE: Multiplanar multisequence MR imaging of the pelvis was performed. No intravenous contrast was administered. COMPARISON:  CT study of 12/25/2018 and 12/27/2018 FINDINGS: Urinary Tract: Urinary bladder is moderately distended without signs of hydronephrosis of the distal ureters. Bowel: Visualized bowel is unremarkable aside from some mild thickening of the distal rectum. Assessment is limited on today's study. Vascular/Lymphatic: Is vascular structures not well assessed given lack of intravenous contrast. Appropriate flow voids are present in the bilateral pelvic vasculature. Reproductive:  Post hysterectomy. Other:  Is diffuse body wall edema. Musculoskeletal: Perisacral and coccygeal fluid collection with complex features measuring 5.2 x 3.8 x 8.9 cm. Edema extends to the levator plate as does fluid. Perirectal edema is also noted on today's study. There is no clear evidence of rectal involvement aside from the edema this changes. Stranding also extends to the posterior  margin of the puborectalis muscle and the sphincter complex though the fluid collection does not extend to this level. Mild increased T2 signal in the sacrum and coccyx. Signs of low T2 signal throughout visualized bony structures aside from the last sacral segment in the coccyx. Is Assessment overall is limited due to patient position and discomfort with inability to Ree acquire limited sequences Fluid in stranding extends into the gluteal cleft left greater than right. High signal also involves the inferior margin of the sphincter complex on the left greater than right. IMPRESSION: 1. Peri sacral and coccygeal fluid collection with stranding extending into the levator plate and sphincter complex. No clear evidence of rectal involvement. Inflammatory changes extend into the left gluteal cleft and also involve the inferior margin of the anal sphincter complex. Anoscopy and or sigmoidoscopy may be helpful for further assessment given above  findings. 2. Mild increased T2 signal in the sacrum and coccyx. Given recent history of trauma this is nonspecific, osteomyelitis or posttraumatic changes are considered. Given proximity of fluid osteomyelitis is favored at this time. 3. Diffuse body wall edema. 4. Post hysterectomy. Electronically Signed   By: Zetta Bills M.D.   On: 12/31/2018 09:49   IR US Guide Bx Asp/Drain  Result Date: 01/01/2019 INDICATION: 48 year old female with a complex peri sacral and coccygeal fluid collection of uncertain etiology. EXAM: Ultrasound-guided aspiration MEDICATIONS: The patient is currently admitted to the hospital and receiving intravenous antibiotics. The antibiotics were administered within an appropriate time frame prior to the initiation of the procedure. ANESTHESIA/SEDATION: Fentanyl 100 mcg IV; Versed 2 mg IV Moderate Sedation Time:  17 minutes The patient was continuously monitored during the procedure by the interventional radiology nurse under my direct supervision.  COMPLICATIONS: None immediate. PROCEDURE: Informed written consent was obtained from the patient after a thorough discussion of the procedural risks, benefits and alternatives. All questions were addressed. A timeout was performed prior to the initiation of the procedure. The region of interest was interrogated with ultrasound. There is a very ill-defined and extremely heterogeneous complex cystic collection in the superficial subcutaneous tissues of the gluteal cleft extending into the medial aspect of the buttock bilaterally. A suitable skin entry site was selected and marked. The overlying skin was sterilely prepped and draped in standard fashion using chlorhexidine skin prep. Local anesthesia was attained by infiltration with 1% lidocaine. A small dermatotomy was made. Under real-time sonographic guidance, an 18 gauge trocar needle was advanced into the fluid collection. Aspiration was performed while manipulating the needle into all of the visible portions of the collection. The collection is extremely thick and may represents upon a fide adipose tissue. Only a trace amount of opaque potentially purulent fluid was successfully aspirated. The volume is perhaps 1-2 mL. The sample was sent for Gram stain and culture. IMPRESSION: 1. Extremely complex fluid collection in the superficial subcutaneous soft tissues of the gluteal cleft extending in the medial aspect of the buttock bilaterally. 2. Aspiration was minimally successful yielding only 1-2 mL of opaque tan potentially purulent fluid which was sent for Gram stain and culture. Differential considerations include panniculitis versus a chronic inflammatory process such as fat necrosis. Signed, Criselda Peaches, MD, [place] [facility] Radiology Electronically Signed   By: Jacqulynn Cadet M.D.   On: 01/01/2019 19:50   Medications: . dialysis solution 1.5% low-MG/low-CA    . linezolid (ZYVOX) IV 600 mg  (01/01/19 2132)  . potassium chloride     . sodium chloride   Intravenous Once  . acetaminophen  650 mg Oral Q6H  . amitriptyline  10 mg Oral QHS  . calcitRIOL  1.5 mcg Oral Daily  . [START ON 01/03/2019] darbepoetin (ARANESP) injection - DIALYSIS  100 mcg Subcutaneous Q Wed-1800  . gentamicin cream  1 application Topical Daily  . heparin  5,000 Units Subcutaneous Q8H  . insulin aspart  0-6 Units Subcutaneous TID WC  . insulin glargine  10 Units Subcutaneous QHS  . metoCLOPramide  5 mg Oral TID AC & HS  . metoprolol succinate  50 mg Oral Daily  . mometasone-formoterol  2 puff Inhalation BID  . oxyCODONE  10 mg Oral Q6H  . pantoprazole  40 mg Oral Daily  . pravastatin  20 mg Oral q1800  . pregabalin  75 mg Oral Daily  . senna-docusate  1 tablet Oral Daily

## 2019-01-02 NOTE — Progress Notes (Signed)
Inpatient Diabetes Program Recommendations  AACE/ADA: New Consensus Statement on Inpatient Glycemic Control (2015)  Target Ranges:  Prepandial:   less than 140 mg/dL      Peak postprandial:   less than 180 mg/dL (1-2 hours)      Critically ill patients:  140 - 180 mg/dL   Lab Results  Component Value Date   GLUCAP 106 (H) 01/02/2019   HGBA1C 7.5 (H) 12/26/2018    Review of Glycemic Control  Results for ELLENA, KAMEN (MRN [id number]) as of 01/02/2019 11:05  Ref. Range 01/01/2019 12:33 01/01/2019 17:36 01/01/2019 18:22 01/01/2019 21:07 01/02/2019 07:52  Glucose-Capillary Latest Ref Range: 70 - 99 mg/dL 80 59 (L) 86 127 (H) 106 (H)    Diabetes history: DM2 Outpatient Diabetes medications: Toujeo 25 units twice daily; Humulin 7-15 TID with meals Current orders for Inpatient glycemic control: Lantus 10 units daily; Novolog 0-6 TID  Inpatient Diabetes Program Recommendations:    Note: Patient had a low BS last evening 59mg /dl.  CBG's running in the low 100's.  -Please consider decreasing Lantus to 8 units daily  Thank you, Geoffry Paradise, RN, BSN Diabetes Coordinator Inpatient Diabetes Program [phone] (team pager from 8a-5p)

## 2019-01-02 NOTE — Plan of Care (Signed)

## 2019-01-02 NOTE — Progress Notes (Addendum)
Family Medicine Teaching Service Daily Progress Note Intern Pager: [phone]  Patient name: Susan Horton Medical record number: [id number] Date of birth: Nov 29, 1970 Age: 48 y.o. Gender: female  Primary Care Provider: Charlott Rakes, MD Consultants: Surgery, nephrology, IR, ID Code Status: Full  Pt Overview and Major Events to Date:  12/25/18 admitted to [facility], aztreonam, vanc and metronidazole started 12/26/18 Vanc discontinued 12/27/18 Linezolid started 12/28/18 aztreonam discontinued, unasyn started 12/29/18 metronidazole and unasyn discontinued 01/01/19 aspiration of perirectal abscess  Assessment and Plan: Lateesha L Jonesis a 48 y.o.femalepresenting with sacral phlegmon. PMH is significant forESRD on home PD, T2DM, HTN, CHF,PVD.  Sacral phlegmon Continues with increased pain. White blood cells improving 49.4>>41.6. Anoscopy and sigmoidoscopy recommended as it may be helpful for further assessment. There is also some concern of osteomyelitis versus posttraumatic changes of the sacrum and coccyx. Will defer further recommendations to infectious disease and general surgery who are both following along this patient. S/p aspiration by IR on 12/21, culture still pending. -continue abx: linezolid (12/18-) -ID consulted, appreciate recommendations -General surgery consulted, appreciate recommendations -Will consult GI today given continued symptoms -Pain control: scheduled tylenol, scheduled oxy IR q6h 10mg  with 5 mg q3h prn -Vitals per routine  Leukocytosis  thrombocytosis No confirmed source of infection.  Patient remains afebrile.  White blood cells improving 49.4>>41.6.  Platelets elevated to 493 today.  Patient continues on linezolid as above.  ID has been consulted.  Awaiting aspiration culture as above. Continue to monitor. -Consider heme consult given anemia with leukocytosis and mild thrombocytosis  Anemia  Unknown etiology at this time but likely d/t on going infection.  Hgb 8.6 this am, s/p 1U pRBC's.  Transfusion threshold 7.0 given CAD.  -Continue to monitor on CBC.  Monitor for signs of bleeding -Consulting GI as above  ESRD on home PD Home medications include PhosLo, calcitriol. Missed PD yesterday in hopes of getting MRI.   -Nephrology consulted, appreciate recommendations  Hypokalemia Potassium 2.9 this a.m.  Has been doing K runs with her PD at night. However we will also give IV 20 mEq of potassium and follow renal recommendations.  Patient currently asymptomatic. Patient has not had EKG during this hospitalization.   - Will obtain EKG in order to ensure no changes with her hypokalemia - Trend BMP, replace as indicated  Constipation, resolved Continue to monitor  CAD s/p stent placement Follows with [facility].Patient has history of CAD and is on aspirin, Plavix and metoprolol. Patient had drug-eluting stent placed in 2018.  -Restart home aspirin and Plavix -Continue home metoprolol  PAD Medications include Plavix, aspirin, lovastatin 20 mg daily -Holding home Plavix, restart pending need for interventions  -continue ASA  -pravastatin 20 mg due to it being formulary in the hospital.  HFpEF Echo from 2019 showed EF of 55 to 60% with G2DD.Home medications as metoprolol XL -Monitor signs and symptoms of heart failure -Continue home metoprolol XL dose  T2 DMwith peripheral neuropathy Patient's home medications include Toujeo 25 units twice dailyand humulin SSI.AM CBG 94. No SSI needed today  -Holding home Toujeo -Sensitive sliding scale -decrease to 8 units Lantus daily -Continue home Lyrica and amitriptyline -CBGs with meals  Gastroparesis Patient has history of gastroparesis for which she takes metoclopramide 5 mg at home. -Continue home metoclopramide  HTN Patient's home medications include Toprol-XL 50 mg daily -Continue home medications  Asthma Home medications include SingulairPRNat bedtime,  Advair twice daily and Ventolin as needed. -Continue home asthma medications -Holding Singulair  FEN/GI: Renal/carb modified PPx: SCDs  Disposition: pending ID and gen sx recommendations   Subjective:  Patient reports that she is still in a lot of pain.  She does not know how she will be able to tolerate sitz bath that she is not able to lay on her back completely.  She has not tried them yet however.  Patient was hopeful that she would be able to have surgery however they still are reporting that she does not need surgery.  He otherwise has no complaints.  She is able to get up and get to the bathroom with minimal assistance.  Objective: Temp:  [97.7 F (36.5 C)-98.6 F (37 C)] 98.6 F (37 C) (12/22 0715) Pulse Rate:  [81-90] 90 (12/22 0715) Resp:  [12-22] 16 (12/22 0715) BP: (96-140)/(36-84) 140/55 (12/22 0715) SpO2:  [94 %-100 %] 98 % (12/22 0715) Weight:  [81.1 kg-81.4 kg] 81.2 kg (12/22 0931) Physical Exam: General: NAD, pleasant Cardiovascular: RRR, no LE edema Respiratory: normal work of breathing GU: Bilateral gluteal cleft swelling still present with some hardness and tenderness.  No significant erythema.  No fluctuance.  Overlying skin changes present MSK: moves 4 extremities equally Derm: no rashes appreciated Neuro: CN II-XII grossly intact Psych: AOx3, appropriate affect  Laboratory: Recent Labs  Lab 01/01/19 0416 01/01/19 1855 01/02/19 0227 01/02/19 0701  WBC 45.5*  --  41.7* 41.6*  HGB 6.8* 10.1* 8.1* 8.6*  HCT 21.3* 31.2* 24.3* 26.8*  PLT 522*  --  447* 493*   Recent Labs  Lab 01/01/19 0416 01/02/19 0227 01/02/19 0701  NA 128* 127* 128*  K 3.3* 2.9* 2.9*  CL 89* 88* 87*  CO2 24 22 24   BUN 53* 52* 55*  CREATININE 8.57* 8.27* 8.58*  CALCIUM 8.3* 8.2* 8.6*  GLUCOSE 98 134* 80    Imaging/Diagnostic Tests:  Catherene Kaleta, [place], DO 01/02/2019, 9:58 AM PGY-3, [place] Intern pager: [phone], text pages welcome

## 2019-01-03 ENCOUNTER — Encounter (HOSPITAL_COMMUNITY): Payer: Self-pay | Admitting: Family Medicine

## 2019-01-03 DIAGNOSIS — E1142 Type 2 diabetes mellitus with diabetic polyneuropathy: Secondary | ICD-10-CM

## 2019-01-03 DIAGNOSIS — E1165 Type 2 diabetes mellitus with hyperglycemia: Secondary | ICD-10-CM

## 2019-01-03 LAB — RENAL FUNCTION PANEL
Albumin: 1.4 g/dL — ABNORMAL LOW (ref 3.5–5.0)
Anion gap: 14 (ref 5–15)
BUN: 52 mg/dL — ABNORMAL HIGH (ref 6–20)
CO2: 26 mmol/L (ref 22–32)
Calcium: 8.5 mg/dL — ABNORMAL LOW (ref 8.9–10.3)
Chloride: 89 mmol/L — ABNORMAL LOW (ref 98–111)
Creatinine, Ser: 7.96 mg/dL — ABNORMAL HIGH (ref 0.44–1.00)
GFR calc Af Amer: 6 mL/min — ABNORMAL LOW (ref >60)
GFR calc non Af Amer: 5 mL/min — ABNORMAL LOW (ref >60)
Glucose, Bld: 150 mg/dL — ABNORMAL HIGH (ref 70–99)
Phosphorus: 5.8 mg/dL — ABNORMAL HIGH (ref 2.5–4.6)
Potassium: 3.1 mmol/L — ABNORMAL LOW (ref 3.5–5.1)
Sodium: 129 mmol/L — ABNORMAL LOW (ref 135–145)

## 2019-01-03 LAB — GLUCOSE, CAPILLARY
Glucose-Capillary: 160 mg/dL — ABNORMAL HIGH (ref 70–99)
Glucose-Capillary: 122 mg/dL — ABNORMAL HIGH (ref 70–99)
Glucose-Capillary: 103 mg/dL — ABNORMAL HIGH (ref 70–99)
Glucose-Capillary: 81 mg/dL (ref 70–99)
Glucose-Capillary: 178 mg/dL — ABNORMAL HIGH (ref 70–99)

## 2019-01-03 LAB — BODY FLUID CELL COUNT WITH DIFFERENTIAL
Eos, Fluid: 1 %
Lymphs, Fluid: 15 %
Monocyte-Macrophage-Serous Fluid: 16 % — ABNORMAL LOW (ref 50–90)
Neutrophil Count, Fluid: 68 % — ABNORMAL HIGH (ref 0–25)
Total Nucleated Cell Count, Fluid: 13 cu mm (ref 0–1000)

## 2019-01-03 LAB — IRON AND TIBC: Iron: 59 ug/dL (ref 28–170)

## 2019-01-03 LAB — RETICULOCYTES
Immature Retic Fract: 25 % — ABNORMAL HIGH (ref 2.3–15.9)
RBC.: 3.29 MIL/uL — ABNORMAL LOW (ref 3.87–5.11)
Retic Count, Absolute: 67.8 10*3/uL (ref 19.0–186.0)
Retic Ct Pct: 2.1 % (ref 0.4–3.1)

## 2019-01-03 LAB — CBC
HCT: 26.2 % — ABNORMAL LOW (ref 36.0–46.0)
Hemoglobin: 8.6 g/dL — ABNORMAL LOW (ref 12.0–15.0)
MCH: 27.2 pg (ref 26.0–34.0)
MCHC: 32.8 g/dL (ref 30.0–36.0)
MCV: 82.9 fL (ref 80.0–100.0)
Platelets: 500 10*3/uL — ABNORMAL HIGH (ref 150–400)
RBC: 3.16 MIL/uL — ABNORMAL LOW (ref 3.87–5.11)
RDW: 14.5 % (ref 11.5–15.5)
WBC: 40.9 10*3/uL — ABNORMAL HIGH (ref 4.0–10.5)
nRBC: 0 % (ref 0.0–0.2)

## 2019-01-03 LAB — VITAMIN B12: Vitamin B-12: 2728 pg/mL — ABNORMAL HIGH (ref 180–914)

## 2019-01-03 LAB — FERRITIN: Ferritin: 1917 ng/mL — ABNORMAL HIGH (ref 11–307)

## 2019-01-03 LAB — FOLATE: Folate: 9.8 ng/mL (ref >5.9)

## 2019-01-03 MED ORDER — HEPARIN 1000 UNIT/ML FOR PERITONEAL DIALYSIS
INTRAPERITONEAL | Status: DC | PRN
  Filled 2019-01-03 (×5): qty 5000

## 2019-01-03 MED ORDER — POTASSIUM CHLORIDE CRYS ER 20 MEQ PO TBCR
40.0000 meq | EXTENDED_RELEASE_TABLET | Freq: Once | ORAL | Status: AC
  Administered 2019-01-03: 40 meq via ORAL
  Filled 2019-01-03: qty 2

## 2019-01-03 MED ORDER — HEPARIN 1000 UNIT/ML FOR PERITONEAL DIALYSIS: 500.0000 [IU] | INTRAMUSCULAR | Status: DC | PRN

## 2019-01-03 MED ORDER — SODIUM CHLORIDE 0.9 % IV SOLN: INTRAVENOUS | Status: DC

## 2019-01-03 MED ORDER — GENTAMICIN SULFATE 0.1 % EX CREA
1.0000 "application " | TOPICAL_CREAM | Freq: Every day | CUTANEOUS | Status: DC
  Filled 2019-01-03: qty 15

## 2019-01-03 MED ORDER — POLYETHYLENE GLYCOL 3350 17 GM/SCOOP PO POWD
255.0000 g | Freq: Once | ORAL | Status: AC
  Administered 2019-01-03: 255 g via ORAL
  Filled 2019-01-03: qty 255

## 2019-01-03 MED ORDER — DELFLEX-LC/2.5% DEXTROSE 394 MOSM/L IP SOLN: INTRAPERITONEAL | Status: DC

## 2019-01-03 NOTE — Consult Note (Signed)
Reason for Consult: Abnormal CT Referring Physician: Hospital team  Susan Horton is an 48 y.o. female.  HPI: Patient seen and examined and discussed with the hospital team x2 in her hospital computer chart reviewed and she really does not have any lower GI complaints and her family history is negative for any colon problems except possibly one aunt with colon cancer and she has not had a previous colonoscopy but may have had an endoscopy years ago but does have an abnormal CT and MRI and the infectious disease doctors and hospital team are requesting at least a flex sig to rule out a mass  Past Medical History:  Diagnosis Date  . Anemia   . Angio-edema   . Asthma   . CAD (coronary artery disease)    DES to mid LAD July 2018, residual moderate RCA disease  . Cataract   . CKD (chronic kidney disease) stage 4, GFR 15-29 ml/min (HCC)    Dialysis T/Th/Sa  . DVT (deep venous thrombosis) ([facility])    1996 during pregnancy, 2015 left leg  . Eczema   . Essential hypertension 12/11/2015  . Gastroparesis   . GERD (gastroesophageal reflux disease)   . Gluteal abscess 12/27/2018  . Hidradenitis   . Migraine   . Neuropathy   . Peripheral vascular disease ([facility])    blood clot in leg  . Type 2 diabetes mellitus ([facility])   . Type II diabetes mellitus ([facility])   . Urticaria     Past Surgical History:  Procedure Laterality Date  . A/V FISTULAGRAM N/A 06/22/2017   Procedure: A/V FISTULAGRAM - left arm;  Surgeon: [name], MD;  Location: [facility];  Service: Cardiovascular;  Laterality: N/A;  . ABDOMINAL AORTOGRAM W/LOWER EXTREMITY Bilateral 12/14/2018   Procedure: ABDOMINAL AORTOGRAM W/LOWER EXTREMITY;  Surgeon: [name], MD;  Location: [facility];  Service: Cardiovascular;  Laterality: Bilateral;  . ABDOMINOPLASTY    . ADENOIDECTOMY    . APPENDECTOMY  1995  . AV FISTULA PLACEMENT Left 02/01/2017   Procedure: ARTERIOVENOUS BRACHIOCEPHALIC (AV) FISTULA CREATION;   Surgeon: [name], MD;  Location: [facility];  Service: Vascular;  Laterality: Left;  . CHOLECYSTECTOMY  1993  . CORONARY ANGIOPLASTY WITH STENT PLACEMENT    . CORONARY STENT INTERVENTION N/A 08/05/2016   Procedure: Coronary Stent Intervention;  Surgeon: [name], MD;  Location: [facility];  Service: Cardiovascular;  Laterality: N/A;  . EXPLORATORY LAPAROTOMY  08/14/2005   lysis of adhesions, drainage of tubo-ovarian abscess  . FISTULA SUPERFICIALIZATION Left 09/14/2017   Procedure: FISTULA SUPERFICIALIZATION ARTERIOVENOUS FISTULA LEFT ARM;  Surgeon: [name], MD;  Location: [facility];  Service: Vascular;  Laterality: Left;  . HYDRADENITIS EXCISION  02/08/2011   Procedure: EXCISION HYDRADENITIS GROIN;  Surgeon: [name], MD;  Location: [facility];  Service: General;  Laterality: N/A;  Excisioin of Hidradenitis Left groin  . INGUINAL HIDRADENITIS EXCISION  07/06/2010   bilateral  . INSERTION OF DIALYSIS CATHETER N/A 09/14/2017   Procedure: INSERTION OF TUNNELED DIALYSIS CATHETER Right Internal Jugular;  Surgeon: [name], MD;  Location: [facility];  Service: Vascular;  Laterality: N/A;  . IR US GUIDE BX ASP/DRAIN  01/01/2019  . LEFT HEART CATH AND CORONARY ANGIOGRAPHY N/A 08/05/2016   Procedure: Left Heart Cath and Coronary Angiography;  Surgeon: [name], MD;  Location: [facility];  Service: Cardiovascular;  Laterality: N/A;  . PERIPHERAL VASCULAR INTERVENTION Left 12/14/2018  Procedure: PERIPHERAL VASCULAR INTERVENTION;  Surgeon: [name], MD;  Location: [facility];  Service: Cardiovascular;  Laterality: Left;  SFA  . REDUCTION MAMMAPLASTY  2002  . TONSILLECTOMY    . TUBAL LIGATION  1996  . VAGINAL HYSTERECTOMY  08/04/2005   and cysto    Family History  Problem Relation Age of Onset  . Allergic rhinitis Mother   . Hypertension Father   . Allergic rhinitis Father   . Hypertension Sister   .  Allergic rhinitis Sister   . Hypertension Brother   . Allergic rhinitis Brother   . Breast cancer Cousin        x2    Social History:  reports that she quit smoking about 2 years ago. Her smoking use included cigarettes. She quit after 20.00 years of use. She has never used smokeless tobacco. She reports that she does not drink alcohol or use drugs.  Allergies:  Allergies  Allergen Reactions  . Repatha [Evolocumab] Itching  . Lisinopril Cough    Medications: I have reviewed the patient's current medications.  Results for orders placed or performed during the hospital encounter of 12/25/18 (from the past 48 hour(s))  Protime-INR     Status: None   Collection Time: 01/01/19 10:32 AM  Result Value Ref Range   Prothrombin Time 15.2 11.4 - 15.2 seconds   INR 1.2 0.8 - 1.2    Comment: (NOTE) INR goal varies based on device and disease states. Performed at [facility], [place] [address] [place], [place] [address]   Glucose, capillary     Status: None   Collection Time: 01/01/19 12:33 PM  Result Value Ref Range   Glucose-Capillary 80 70 - 99 mg/dL  Culture, fungus without smear     Status: None (Preliminary result)   Collection Time: 01/01/19  4:49 PM   Specimen: PATH Other; Abscess  Result Value Ref Range   Specimen Description ABSCESS SACRAL    Special Requests NONE    Culture      NO GROWTH < 24 HOURS Performed at [facility], [place] [address] [place], [place] [address]    Report Status PENDING   Aerobic/Anaerobic Culture (surgical/deep wound)     Status: None (Preliminary result)   Collection Time: 01/01/19  4:49 PM   Specimen: PATH Other; Abscess  Result Value Ref Range   Specimen Description ABSCESS SACRAL    Special Requests NONE    Gram Stain      RARE WBC PRESENT, PREDOMINANTLY PMN NO ORGANISMS SEEN    Culture      NO GROWTH 2 DAYS Performed at [facility] [address], [place], [place] [address]    Report Status PENDING   Glucose,  capillary     Status: Abnormal   Collection Time: 01/01/19  5:36 PM  Result Value Ref Range   Glucose-Capillary 59 (L) 70 - 99 mg/dL  Glucose, capillary     Status: None   Collection Time: 01/01/19  6:22 PM  Result Value Ref Range   Glucose-Capillary 86 70 - 99 mg/dL  Hemoglobin and hematocrit, blood     Status: Abnormal   Collection Time: 01/01/19  6:55 PM  Result Value Ref Range   Hemoglobin 10.1 (L) 12.0 - 15.0 g/dL    Comment: REPEATED TO VERIFY POST TRANSFUSION SPECIMEN DELTA CHECK NOTED    HCT 31.2 (L) 36.0 - 46.0 %    Comment: Performed at [facility], [place]  [address], [place], [place] [address]  Glucose, capillary     Status: Abnormal   Collection Time: 01/01/19  9:07 PM  Result Value Ref Range   Glucose-Capillary 127 (H) 70 - 99 mg/dL  CBC     Status: Abnormal   Collection Time: 01/02/19  2:27 AM  Result Value Ref Range   WBC 41.7 (H) 4.0 - 10.5 K/uL   RBC 2.99 (L) 3.87 - 5.11 MIL/uL   Hemoglobin 8.1 (L) 12.0 - 15.0 g/dL   HCT 24.3 (L) 36.0 - 46.0 %   MCV 81.3 80.0 - 100.0 fL   MCH 27.1 26.0 - 34.0 pg   MCHC 33.3 30.0 - 36.0 g/dL   RDW 14.0 11.5 - 15.5 %   Platelets 447 (H) 150 - 400 K/uL   nRBC 0.0 0.0 - 0.2 %    Comment: Performed at [facility], [place]. [address], [place], [place] [address]  Basic metabolic panel     Status: Abnormal   Collection Time: 01/02/19  2:27 AM  Result Value Ref Range   Sodium 127 (L) 135 - 145 mmol/L   Potassium 2.9 (L) 3.5 - 5.1 mmol/L   Chloride 88 (L) 98 - 111 mmol/L   CO2 22 22 - 32 mmol/L   Glucose, Bld 134 (H) 70 - 99 mg/dL   BUN 52 (H) 6 - 20 mg/dL   Creatinine, Ser 8.27 (H) 0.44 - 1.00 mg/dL   Calcium 8.2 (L) 8.9 - 10.3 mg/dL   GFR calc non Af Amer 5 (L) >60 mL/min   GFR calc Af Amer 6 (L) >60 mL/min   Anion gap 17 (H) 5 - 15    Comment: Performed at [facility] [address], [place], [place] [address]  CBC     Status: Abnormal   Collection Time: 01/02/19  7:01 AM  Result Value Ref Range   WBC  41.6 (H) 4.0 - 10.5 K/uL   RBC 3.19 (L) 3.87 - 5.11 MIL/uL   Hemoglobin 8.6 (L) 12.0 - 15.0 g/dL   HCT 26.8 (L) 36.0 - 46.0 %   MCV 84.0 80.0 - 100.0 fL   MCH 27.0 26.0 - 34.0 pg   MCHC 32.1 30.0 - 36.0 g/dL   RDW 14.3 11.5 - 15.5 %   Platelets 493 (H) 150 - 400 K/uL   nRBC 0.0 0.0 - 0.2 %    Comment: Performed at [facility], [place] [address] [place], [place] [address]  Basic metabolic panel     Status: Abnormal   Collection Time: 01/02/19  7:01 AM  Result Value Ref Range   Sodium 128 (L) 135 - 145 mmol/L   Potassium 2.9 (L) 3.5 - 5.1 mmol/L   Chloride 87 (L) 98 - 111 mmol/L   CO2 24 22 - 32 mmol/L   Glucose, Bld 80 70 - 99 mg/dL   BUN 55 (H) 6 - 20 mg/dL   Creatinine, Ser 8.58 (H) 0.44 - 1.00 mg/dL   Calcium 8.6 (L) 8.9 - 10.3 mg/dL   GFR calc non Af Amer 5 (L) >60 mL/min   GFR calc Af Amer 6 (L) >60 mL/min   Anion gap 17 (H) 5 - 15    Comment: Performed at [facility], [address], [place], [place] [address]  Glucose, capillary     Status: Abnormal   Collection Time: 01/02/19  7:52 AM  Result Value Ref Range   Glucose-Capillary 106 (H) 70 - 99 mg/dL  Glucose, capillary     Status: None   Collection Time: 01/02/19 11:46 AM  Result Value Ref Range   Glucose-Capillary 84 70 - 99 mg/dL  Glucose, capillary     Status: Abnormal   Collection Time: 01/02/19  4:49 PM  Result Value Ref Range   Glucose-Capillary 102 (H) 70 - 99 mg/dL  Glucose, capillary     Status: Abnormal   Collection Time: 01/02/19  9:53 PM  Result Value Ref Range   Glucose-Capillary 175 (H) 70 - 99 mg/dL   Comment 1 Notify RN    Comment 2 Document in Chart   Glucose, capillary     Status: Abnormal   Collection Time: 01/03/19  1:17 AM  Result Value Ref Range   Glucose-Capillary 160 (H) 70 - 99 mg/dL   Comment 1 Notify RN    Comment 2 Document in Chart   Renal function panel     Status: Abnormal   Collection Time: 01/03/19  2:08 AM  Result Value Ref Range   Sodium 129 (L) 135 - 145  mmol/L   Potassium 3.1 (L) 3.5 - 5.1 mmol/L   Chloride 89 (L) 98 - 111 mmol/L   CO2 26 22 - 32 mmol/L   Glucose, Bld 150 (H) 70 - 99 mg/dL   BUN 52 (H) 6 - 20 mg/dL   Creatinine, Ser 7.96 (H) 0.44 - 1.00 mg/dL   Calcium 8.5 (L) 8.9 - 10.3 mg/dL   Phosphorus 5.8 (H) 2.5 - 4.6 mg/dL   Albumin 1.4 (L) 3.5 - 5.0 g/dL   GFR calc non Af Amer 5 (L) >60 mL/min   GFR calc Af Amer 6 (L) >60 mL/min   Anion gap 14 5 - 15    Comment: Performed at [facility], [address] [place], [place] [address]  CBC     Status: Abnormal   Collection Time: 01/03/19  2:08 AM  Result Value Ref Range   WBC 40.9 (H) 4.0 - 10.5 K/uL   RBC 3.16 (L) 3.87 - 5.11 MIL/uL   Hemoglobin 8.6 (L) 12.0 - 15.0 g/dL   HCT 26.2 (L) 36.0 - 46.0 %   MCV 82.9 80.0 - 100.0 fL   MCH 27.2 26.0 - 34.0 pg   MCHC 32.8 30.0 - 36.0 g/dL   RDW 14.5 11.5 - 15.5 %   Platelets 500 (H) 150 - 400 K/uL   nRBC 0.0 0.0 - 0.2 %    Comment: Performed at [facility], [place] [address], [place], [place] [address]  Body fluid cell count with differential     Status: Abnormal   Collection Time: 01/03/19  6:50 AM  Result Value Ref Range   Fluid Type-FCT PERITONEAL DIALYSIS    Color, Fluid COLORLESS (A) YELLOW   Appearance, Fluid CLEAR CLEAR   Total Nucleated Cell Count, Fluid 13 0 - 1,000 cu mm   Neutrophil Count, Fluid 68 (H) 0 - 25 %   Lymphs, Fluid 15 %   Monocyte-Macrophage-Serous Fluid 16 (L) 50 - 90 %   Eos, Fluid 1 %   Other Cells, Fluid RARE MESOTHELIAL CELLS %    Comment: Performed at [facility] [address] [place],  [address]  Glucose, capillary     Status: Abnormal   Collection Time: 01/03/19  8:05 AM  Result Value Ref Range   Glucose-Capillary 122 (H) 70 - 99 mg/dL    IR US Guide Bx Asp/Drain  Result Date: 01/01/2019 INDICATION: 48 year old female with  a complex peri sacral and coccygeal fluid collection of uncertain etiology. EXAM: Ultrasound-guided aspiration MEDICATIONS: The patient is  currently admitted to the hospital and receiving intravenous antibiotics. The antibiotics were administered within an appropriate time frame prior to the initiation of the procedure. ANESTHESIA/SEDATION: Fentanyl 100 mcg IV; Versed 2 mg IV Moderate Sedation Time:  17 minutes The patient was continuously monitored during the procedure by the interventional radiology nurse under my direct supervision. COMPLICATIONS: None immediate. PROCEDURE: Informed written consent was obtained from the patient after a thorough discussion of the procedural risks, benefits and alternatives. All questions were addressed. A timeout was performed prior to the initiation of the procedure. The region of interest was interrogated with ultrasound. There is a very ill-defined and extremely heterogeneous complex cystic collection in the superficial subcutaneous tissues of the gluteal cleft extending into the medial aspect of the buttock bilaterally. A suitable skin entry site was selected and marked. The overlying skin was sterilely prepped and draped in standard fashion using chlorhexidine skin prep. Local anesthesia was attained by infiltration with 1% lidocaine. A small dermatotomy was made. Under real-time sonographic guidance, an 18 gauge trocar needle was advanced into the fluid collection. Aspiration was performed while manipulating the needle into all of the visible portions of the collection. The collection is extremely thick and may represents upon a fide adipose tissue. Only a trace amount of opaque potentially purulent fluid was successfully aspirated. The volume is perhaps 1-2 mL. The sample was sent for Gram stain and culture. IMPRESSION: 1. Extremely complex fluid collection in the superficial subcutaneous soft tissues of the gluteal cleft extending in the medial aspect of the buttock bilaterally. 2. Aspiration was minimally successful yielding only 1-2 mL of opaque tan potentially purulent fluid which was sent for Gram stain  and culture. Differential considerations include panniculitis versus a chronic inflammatory process such as fat necrosis. Signed, Criselda Peaches, MD, [facility] Electronically Signed   By: Jacqulynn Cadet M.D.   On: 01/01/2019 19:50    Review of Systems negative except above she does want to eat which we discussed Blood pressure (!) 142/62, pulse 85, temperature 97.6 F (36.4 C), temperature source Oral, resp. rate 18, height 5\' 5"  (1.651 m), weight 81.1 kg, SpO2 99 %. Physical Exam vital signs stable afebrile no acute distress she looks way better than her story sounded exam pertinent for her abdomen being soft nontender labs CT MRI reviewed Assessment/Plan: Abnormal CT MRI of the rectum questionable etiology Plan: The risks benefits methods of flex sig and possible colonoscopy was discussed with the patient and will proceed tomorrow morning with further work-up and plans pending those findings and we discussed the prep as well  Carol Loftin E 01/03/2019, 9:54 AM

## 2019-01-03 NOTE — Progress Notes (Signed)
[facility] Progress Note   Dialysis Orders: CCPD - 1 2.5 5 L bag and 1 1.5 5 L bag - 5 fills - 2 L fills with 1 L in last fill dwell 1.5 hr  Assessment/Plan: 1. Sacaral/coccygeal/gluteal abscess -Surgery aspirated sacral fluidagain 12/22small amount tan fluid - neg gram stain cultures pending  ID consulting. MRI showed possible osteomyelitis.  ID consulting- abtx per primary- currently on linezolid 2. Leukocytosis -WBC 25 >33 K >38.9>41.6>49.4>47.8> 45.> 41.6 > 40.6  Gluteal abscess vs leukocytosis. . Does not have any symptoms of perotinits 2. Gastroparesis - chronic problem -on chronic Reglan 5 mg.  3.Peritonitis - cell count 333 - with 95% neutrophils - neg culture No abdominal pain.Per radiology eval of 12/14 CT scan there is no communication of the abscess with the peritoneum  4. ESRDwith  hypokalemia - blood today will offer some K- on PD- continue 1.5% solution. Has an AVF so could easily transition to HD if she had surgery for abscess that might warrant a holiday from PDor if PD catheter had to be removed.No communication with peritoneum on 12/14 scan per radiology.PD cath clotting, improved using heparinized solution. Na low and wts creeping up - continue 1/2 1.5 and 1/2 2.5 this evening only- currently  No daily time dwells.  - K 3.1 labs drawn in the middle of PD early am- defer repletion of K to teaching service 5. Anemia- hgb 10.3 >8.7>9.2>7.5>6.8 tx 1 unit > 8.6  held oral Fe to lessen pill burden with gastroparesis - increase Aranesp 182mcg qwk - has had in the past here - gets Mircera as outpatient (last dose in Sept)  6. Secondary hyperparathyroidism- Ca 8.3 on 3 Ca acetate ac - was heldto lessen pill burdenand with nausea P 6.1 boderline- resume at d/c - continue calcitriol. 7.HTN/volume- BP variable- creeping up with low Na - will use all 2.5s tonight on dialysis  On Toprol . Low net UFs 8 . DM- per primary 9. Nutrition - alb low  -liberalized diet to regular to augment potassium intake and increase options 10. Constipation - has senokot - add daily miralax  Myriam Jacobson, PA-C [facility] Beeper [phone] 01/03/2019,8:52 AM  LOS: 8 days   Subjective:   Denies problems with PD - Objective Vitals:   01/02/19 2153 01/03/19 0545 01/03/19 0650 01/03/19 0749  BP: 140/68 (!) 141/54 (!) 142/62   Pulse: 84 82 82 85  Resp: 17 16 16 18   Temp: 98.4 F (36.9 C) 97.9 F (36.6 C) 97.6 F (36.4 C)   TempSrc: Oral Oral Oral   SpO2: 99% 99% 98% 99%  Weight:   81.1 kg   Height:       Physical Exam General: NAD Heart: RRR Lungs: no rales Abdomen: soft but distended - nontender Extremities: no LE edema Dialysis Access: PD cath intact   Additional Objective Labs: Basic Metabolic Panel: Recent Labs  Lab 01/01/19 0416 01/02/19 0227 01/02/19 0701 01/03/19 0208  NA 128* 127* 128* 129*  K 3.3* 2.9* 2.9* 3.1*  CL 89* 88* 87* 89*  CO2 24 22 24 26   GLUCOSE 98 134* 80 150*  BUN 53* 52* 55* 52*  CREATININE 8.57* 8.27* 8.58* 7.96*  CALCIUM 8.3* 8.2* 8.6* 8.5*  PHOS 6.1*  --   --  5.8*   Liver Function Tests: Recent Labs  Lab 01/03/19 0208  ALBUMIN 1.4*   No results for input(s): LIPASE, AMYLASE in the last 168 hours. CBC: Recent Labs  Lab 12/29/18 0454  12/31/18 3810 01/01/19 0416 01/02/19 0227 01/02/19 0701 01/03/19 0208  WBC 41.6*  42.4* 47.8* 45.5* 41.7* 41.6* 40.9*  NEUTROABS 36.3*  --   --   --   --   --   HGB 9.2*  9.2* 7.5* 6.8* 8.1* 8.6* 8.6*  HCT 29.9*  30.0* 23.7* 21.3* 24.3* 26.8* 26.2*  MCV 86.2  86.2 84.3 83.5 81.3 84.0 82.9  PLT 490*  499* 518* 522* 447* 493* 500*   Blood Culture    Component Value Date/Time   SDES ABSCESS SACRAL 01/01/2019 1649   SDES ABSCESS SACRAL 01/01/2019 1649   SPECREQUEST NONE 01/01/2019 1649   SPECREQUEST NONE 01/01/2019 1649   CULT  01/01/2019 1649    NO GROWTH < 24 HOURS Performed at [facility], [place] [address],  [place], [place] [address]    CULT  01/01/2019 1649    NO GROWTH < 24 HOURS Performed at [facility], [place] [address], [place], [place] [address]    REPTSTATUS PENDING 01/01/2019 1649   REPTSTATUS PENDING 01/01/2019 1649    Cardiac Enzymes: No results for input(s): CKTOTAL, CKMB, CKMBINDEX, TROPONINI in the last 168 hours. CBG: Recent Labs  Lab 01/02/19 1146 01/02/19 1649 01/02/19 2153 01/03/19 0117 01/03/19 0805  GLUCAP 84 102* 175* 160* 122*   Iron Studies: No results for input(s): IRON, TIBC, TRANSFERRIN, FERRITIN in the last 72 hours. Lab Results  Component Value Date   INR 1.2 01/01/2019   INR 0.91 02/01/2017   INR 0.9 07/26/2016   Studies/Results: IR US Guide Bx Asp/Drain  Result Date: 01/01/2019 INDICATION: 48 year old female with a complex peri sacral and coccygeal fluid collection of uncertain etiology. EXAM: Ultrasound-guided aspiration MEDICATIONS: The patient is currently admitted to the hospital and receiving intravenous antibiotics. The antibiotics were administered within an appropriate time frame prior to the initiation of the procedure. ANESTHESIA/SEDATION: Fentanyl 100 mcg IV; Versed 2 mg IV Moderate Sedation Time:  17 minutes The patient was continuously monitored during the procedure by the interventional radiology nurse under my direct supervision. COMPLICATIONS: None immediate. PROCEDURE: Informed written consent was obtained from the patient after a thorough discussion of the procedural risks, benefits and alternatives. All questions were addressed. A timeout was performed prior to the initiation of the procedure. The region of interest was interrogated with ultrasound. There is a very ill-defined and extremely heterogeneous complex cystic collection in the superficial subcutaneous tissues of the gluteal cleft extending into the medial aspect of the buttock bilaterally. A suitable skin entry site was selected and marked. The overlying skin was sterilely prepped  and draped in standard fashion using chlorhexidine skin prep. Local anesthesia was attained by infiltration with 1% lidocaine. A small dermatotomy was made. Under real-time sonographic guidance, an 18 gauge trocar needle was advanced into the fluid collection. Aspiration was performed while manipulating the needle into all of the visible portions of the collection. The collection is extremely thick and may represents upon a fide adipose tissue. Only a trace amount of opaque potentially purulent fluid was successfully aspirated. The volume is perhaps 1-2 mL. The sample was sent for Gram stain and culture. IMPRESSION: 1. Extremely complex fluid collection in the superficial subcutaneous soft tissues of the gluteal cleft extending in the medial aspect of the buttock bilaterally. 2. Aspiration was minimally successful yielding only 1-2 mL of opaque tan potentially purulent fluid which was sent for Gram stain and culture. Differential considerations include panniculitis versus a chronic inflammatory process such as fat necrosis. Signed, Myrle Sheng  K. Laurence Ferrari, MD, [facility] Radiology Electronically Signed   By: Jacqulynn Cadet M.D.   On: 01/01/2019 19:50   Medications: . dialysis solution 1.5% low-MG/low-CA     . sodium chloride   Intravenous Once  . acetaminophen  650 mg Oral Q6H  . amitriptyline  10 mg Oral QHS  . calcitRIOL  1.5 mcg Oral Daily  . darbepoetin (ARANESP) injection - DIALYSIS  100 mcg Subcutaneous Q Wed-1800  . gentamicin cream  1 application Topical Daily  . heparin  5,000 Units Subcutaneous Q8H  . insulin aspart  0-6 Units Subcutaneous TID WC  . insulin glargine  8 Units Subcutaneous QHS  . linezolid  600 mg Oral Q12H  . metoCLOPramide  5 mg Oral TID AC & HS  . metoprolol succinate  50 mg Oral Daily  . mometasone-formoterol  2 puff Inhalation BID  . oxyCODONE  10 mg Oral Q6H  . pantoprazole  40 mg Oral Daily  . polyethylene  glycol  17 g Oral Daily  . pravastatin  20 mg Oral q1800  . pregabalin  75 mg Oral Daily  . senna-docusate  1 tablet Oral Daily

## 2019-01-03 NOTE — Progress Notes (Signed)
Family Medicine Teaching Service Daily Progress Note Intern Pager: [phone]  Patient name: Susan Horton Medical record number: [id number] Date of birth: 10-Jan-1971 Age: 48 y.o. Gender: female  Primary Care Provider: Charlott Rakes, MD Consultants: Surgery, nephrology, IR, ID Code Status: Full  Pt Overview and Major Events to Date:  12/25/18 admitted to [facility], aztreonam, vanc and metronidazole started 12/26/18 Vanc discontinued 12/27/18 Linezolid started 12/28/18 aztreonam discontinued, unasyn started 12/29/18 metronidazole and unasyn discontinued 01/01/19 aspiration of perirectal abscess  Assessment and Plan: Susan L Jonesis a 48 y.o.femalepresenting with sacral phlegmon. PMH is significant forESRD on home PD, T2DM, HTN, CHF,PVD.  Sacral phlegmon Continues to cause pt discomfort. WBC improving 49.4>41.6>40.9.  Colonoscopy recommended per ID as it may be helpful for further assessment. There is also some concern of osteomyelitis versus posttraumatic changes of the sacrum and coccyx.  S/p aspiration by IR on 12/21, culture still pending. -Continue abx: linezolid (12/18-) -ID following, appreciate recommendations, see above -General surgery following appreciate recommendations -Will consult GI today given continued symptoms -Pain control: scheduled tylenol, scheduled oxy IR q6h 10mg  with 5 mg q3h prn -Vitals per routine  Leukocytosis  thrombocytosis Please secondary to ongoing infection however source still under investigation and remains unclear . Pt remains afebrile overnight.   WBC 49.4>41.6>40.9, improving Platelets 500 today. Pt continues on linezolid as above.  Aspiration of pericecal fluid on 12/21.  Gram stain negative.  We will check pending continue to monitor. -Consider heme consult given anemia with leukocytosis and mild thrombocytosis  Anemia  Unknown etiology at this time but likely 2/2 on going infection.  Hgb 8.6 this am, stable, s/p 1U pRBC's on  12/21 Transfusion threshold 8 given CAD.  -Continue to monitor on CBC. Monitor for signs of bleeding -Consulting GI for possible malignancy   ESRD on home PD Home medications include PhosLo, calcitriol. Missed PD yesterday in hopes of getting MRI.   -Nephrology consulted, appreciate recommendations  Hypokalemia K  3.1 today. Treated with Kdur 40 mEq this morning.  Patient currently asymptomatic. -Continue to monitor K with daily BMP  Constipation, resolved Continue to monitor  CAD s/p stent placement Follows with [facility].Patient has history of CAD and is on aspirin, Plavix and metoprolol. Patient had drug-eluting stent placed in 2018.  -Restart home aspirin and Plavix -Continue home metoprolol  PAD Medications include Plavix, aspirin, lovastatin 20 mg daily -Holding home Plavix and ASA -Pravastatin 20 mg due to it being formulary in the hospital.  HFpEF Echo from 2019 showed EF of 55 to 60% with G2DD.Home medications as metoprolol XL -Monitor signs and symptoms of heart failure -Continue home metoprolol XL dose  T2 DMwith peripheral neuropathy Patient's home medications include Toujeo 25 units twice dailyand humulin SSI.AM CBG 122. No SSI needed today  -Holding home Toujeo -Sensitive sliding scale - 8 units Lantus daily -Continue home Lyrica and amitriptyline -CBGs with meals  Gastroparesis Patient has history of gastroparesis for which she takes metoclopramide 5 mg at home. -Continue home metoclopramide  HTN Patient's home medications include Toprol-XL 50 mg daily -Continue home medications  Asthma Home medications include SingulairPRNat bedtime, Advair twice daily and Ventolin as needed. -Continue home asthma medications -Holding Singulair    FEN/GI: Renal/carb modified PPx: SCDs  Disposition: pending ID and gen sx recommendations   Subjective:  Pt reports being tired and fed up of pain and wants to know the cause. When  I was in the room with Susan Horton, GI Attending came in and explained he  will do a sigmoidoscopy tomorrow and will be on clear liquids for now until then. Explained to pt she will need to have bowel prep too.  Objective: Temp:  [97.6 F (36.4 C)-98.4 F (36.9 C)] 97.6 F (36.4 C) (12/23 0650) Pulse Rate:  [82-89] 85 (12/23 0749) Resp:  [16-18] 18 (12/23 0749) BP: (110-154)/(50-80) 142/62 (12/23 0650) SpO2:  [97 %-100 %] 99 % (12/23 0749) Weight:  [81.1 kg-81.2 kg] 81.1 kg (12/23 0650)  General: Alert, cooperative, laying on right side, mild discomfort Cardio: Normal S1 and S2, RRR. No murmurs or rubs.   Pulm: Clear to auscultation bilaterally, no crackles, wheezing, or diminished breath sounds. Normal respiratory effort Abdomen: Bowel sounds normal. Abdomen soft and non-tender.  GU: Bilateral gluteal swelling, worse on the left compared to right. Tender and hardness. No significant erythema.  Extremities: No peripheral edema. Warm/ well perfused.  Strong radial pulse Neuro: Cranial nerves grossly intact  Laboratory: Recent Labs  Lab 01/02/19 0227 01/02/19 0701 01/03/19 0208  WBC 41.7* 41.6* 40.9*  HGB 8.1* 8.6* 8.6*  HCT 24.3* 26.8* 26.2*  PLT 447* 493* 500*   Recent Labs  Lab 01/02/19 0227 01/02/19 0701 01/03/19 0208  NA 127* 128* 129*  K 2.9* 2.9* 3.1*  CL 88* 87* 89*  CO2 22 24 26   BUN 52* 55* 52*  CREATININE 8.27* 8.58* 7.96*  CALCIUM 8.2* 8.6* 8.5*  GLUCOSE 134* 80 150*    Imaging/Diagnostic Tests:  Susan Haw, MD 01/03/2019, 8:17 AM PGY-1, [place] Intern pager: [phone], text pages welcome

## 2019-01-03 NOTE — Progress Notes (Signed)
Subjective: Stable and alert.  No fever or tachycardia. Says intergluteal cleft pain slowly improving.  No drainage Sitz bath's were not started yesterday but they are going to be started now.  Discussed with nursing staff and patient.  Aspirated fluid Gram stain negative.  Culture negative thus far. Remains on linezolid.  ID following.  Objective: Vital signs in last 24 hours: Temp:  [97.8 F (36.6 C)-98.6 F (37 C)] 97.9 F (36.6 C) (12/23 0545) Pulse Rate:  [82-90] 82 (12/23 0545) Resp:  [16-18] 16 (12/23 0545) BP: (110-154)/(50-80) 141/54 (12/23 0545) SpO2:  [97 %-100 %] 99 % (12/23 0545) Weight:  [81.1 kg-81.2 kg] 81.1 kg (12/22 1738) Last BM Date: 01/01/19  Intake/Output from previous day: 12/22 0701 - 12/23 0700 In: 300 [P.O.:300] Out: -  Intake/Output this shift: No intake/output data recorded.    PE: Gen: Alert, NAD Pulm:Rate andeffort normal Abd: Soft, NT/ND, +BS,PD catheter in place without erythema or drainage Skin: warm and dry GU: Bilateral gluteal cleft swelling still present.  Swelling less  Tenderness less.  No significant erythema today.  No fluctuance.  Skin overlying this process is a little wrinkled now suggesting the process is slowly involuting.   Lab Results:  Recent Labs    01/02/19 0701 01/03/19 0208  WBC 41.6* 40.9*  HGB 8.6* 8.6*  HCT 26.8* 26.2*  PLT 493* 500*   BMET Recent Labs    01/02/19 0701 01/03/19 0208  NA 128* 129*  K 2.9* 3.1*  CL 87* 89*  CO2 24 26  GLUCOSE 80 150*  BUN 55* 52*  CREATININE 8.58* 7.96*  CALCIUM 8.6* 8.5*   PT/INR Recent Labs    01/01/19 1032  LABPROT 15.2  INR 1.2   ABG No results for input(s): PHART, HCO3 in the last 72 hours.  Invalid input(s): PCO2, PO2  Studies/Results: IR US Guide Bx Asp/Drain  Result Date: 01/01/2019 INDICATION: 48 year old female with a complex peri sacral and coccygeal fluid collection of uncertain etiology. EXAM: Ultrasound-guided aspiration  MEDICATIONS: The patient is currently admitted to the hospital and receiving intravenous antibiotics. The antibiotics were administered within an appropriate time frame prior to the initiation of the procedure. ANESTHESIA/SEDATION: Fentanyl 100 mcg IV; Versed 2 mg IV Moderate Sedation Time:  17 minutes The patient was continuously monitored during the procedure by the interventional radiology nurse under my direct supervision. COMPLICATIONS: None immediate. PROCEDURE: Informed written consent was obtained from the patient after a thorough discussion of the procedural risks, benefits and alternatives. All questions were addressed. A timeout was performed prior to the initiation of the procedure. The region of interest was interrogated with ultrasound. There is a very ill-defined and extremely heterogeneous complex cystic collection in the superficial subcutaneous tissues of the gluteal cleft extending into the medial aspect of the buttock bilaterally. A suitable skin entry site was selected and marked. The overlying skin was sterilely prepped and draped in standard fashion using chlorhexidine skin prep. Local anesthesia was attained by infiltration with 1% lidocaine. A small dermatotomy was made. Under real-time sonographic guidance, an 18 gauge trocar needle was advanced into the fluid collection. Aspiration was performed while manipulating the needle into all of the visible portions of the collection. The collection is extremely thick and may represents upon a fide adipose tissue. Only a trace amount of opaque potentially purulent fluid was successfully aspirated. The volume is perhaps 1-2 mL. The sample was sent for Gram stain and culture. IMPRESSION: 1. Extremely complex fluid collection in the superficial subcutaneous soft tissues  of the gluteal cleft extending in the medial aspect of the buttock bilaterally. 2. Aspiration was minimally successful yielding only 1-2 mL of opaque tan potentially purulent fluid  which was sent for Gram stain and culture. Differential considerations include panniculitis versus a chronic inflammatory process such as fat necrosis. Signed, Criselda Peaches, MD, [facility] Radiology Electronically Signed   By: Jacqulynn Cadet M.D.   On: 01/01/2019 19:50    Anti-infectives: Anti-infectives (From admission, onward)   Start     Dose/Rate Route Frequency Ordered Stop   01/02/19 2200  linezolid (ZYVOX) tablet 600 mg     600 mg Oral Every 12 hours 01/02/19 1431     01/01/19 1800  fluconazole (DIFLUCAN) IVPB 200 mg  Status:  Discontinued     200 mg 100 mL/hr over 60 Minutes Intravenous Every 24 hours 01/01/19 0820 01/01/19 1204   12/31/18 1500  fluconazole (DIFLUCAN) IVPB 400 mg     400 mg 100 mL/hr over 120 Minutes Intravenous NOW 12/31/18 1413 12/31/18 1715   12/29/18 1400  linezolid (ZYVOX) IVPB 600 mg  Status:  Discontinued     600 mg 300 mL/hr over 60 Minutes Intravenous Every 12 hours 12/29/18 1312 01/02/19 1431   12/28/18 1800  ampicillin-sulbactam (UNASYN) 1.5 g in sodium chloride 0.9 % 100 mL IVPB  Status:  Discontinued     1.5 g 200 mL/hr over 30 Minutes Intravenous Every 12 hours 12/28/18 1429 12/29/18 1311   12/28/18 1230  amoxicillin (AMOXIL) capsule 500 mg     500 mg Oral  Once 12/28/18 1218 12/28/18 1253   12/28/18 1115  penicillin G in sodium chloride 10,000 units/mL syringe for skin test - 1st bleb     1,000 Units Intradermal  Once 12/28/18 1034 12/28/18 1130   12/28/18 1115  penicillin G in sodium chloride 10,000 units/mL syringe for skin test - 2nd bleb     1,000 Units Intradermal  Once 12/28/18 1034 12/28/18 1130   12/28/18 1100  penicillin G in sodium chloride 10,000 units/mL syringe for skin test     1,000 Units Topical  Once 12/28/18 1034 12/28/18 1130   12/28/18 1030  penicillin G in sodium chloride 10,000 units/mL syringe for skin test  Status:  Discontinued     1,000 Units Topical  Once  12/28/18 1018 12/28/18 1034   12/28/18 1030  penicillin G in sodium chloride 10,000 units/mL syringe for skin test - 1st bleb  Status:  Discontinued     1,000 Units Intradermal  Once 12/28/18 1018 12/28/18 1034   12/28/18 1030  penicillin G in sodium chloride 10,000 units/mL syringe for skin test - 2nd bleb  Status:  Discontinued     1,000 Units Intradermal  Once 12/28/18 1018 12/28/18 1034   12/26/18 0130  aztreonam (AZACTAM) 0.5 g in dextrose 5 % 50 mL IVPB  Status:  Discontinued     0.5 g 100 mL/hr over 30 Minutes Intravenous Every 8 hours 12/25/18 1858 12/28/18 1429   12/26/18 0000  vancomycin variable dose per unstable renal function (pharmacist dosing)  Status:  Discontinued      Does not apply See admin instructions 12/25/18 1634 12/28/18 1139   12/25/18 2200  aztreonam (AZACTAM) 0.5 g in dextrose 5 % 50 mL IVPB  Status:  Discontinued     0.5 g 100 mL/hr over 30 Minutes Intravenous Every 12 hours 12/25/18 1634 12/25/18 1646   12/25/18 1900  metroNIDAZOLE (FLAGYL) IVPB 500 mg  Status:  Discontinued     500 mg 100 mL/hr over 60 Minutes Intravenous Every 8 hours 12/25/18 1858 12/29/18 1327   12/25/18 1700  vancomycin (VANCOCIN) 2,000 mg in sodium chloride 0.9 % 500 mL IVPB  Status:  Discontinued     2,000 mg 250 mL/hr over 120 Minutes Intravenous  Once 12/25/18 1648 12/25/18 2030   12/25/18 1646  aztreonam (AZACTAM) 0.5 g in dextrose 5 % 50 mL IVPB  Status:  Discontinued     0.5 g 100 mL/hr over 30 Minutes Intravenous Every 8 hours 12/25/18 1646 12/25/18 1851   12/25/18 1645  vancomycin (VANCOCIN) IVPB 1000 mg/200 mL premix  Status:  Discontinued     1,000 mg 200 mL/hr over 60 Minutes Intravenous  Once 12/25/18 1631 12/25/18 1634   12/25/18 1645  aztreonam (AZACTAM) 2 g in sodium chloride 0.9 % 100 mL IVPB  Status:  Discontinued     2 g 200 mL/hr over 30 Minutes Intravenous  Once 12/25/18 1631 12/25/18 1634   12/25/18 1645  vancomycin (VANCOCIN) IVPB 1000 mg/200 mL premix  Status:   Discontinued     1,000 mg 200 mL/hr over 60 Minutes Intravenous  Once 12/25/18 1632 12/25/18 1634   12/25/18 1645  aztreonam (AZACTAM) 1 g in sodium chloride 0.9 % 100 mL IVPB  Status:  Discontinued     1 g 200 mL/hr over 30 Minutes Intravenous  Once 12/25/18 1632 12/25/18 1634   12/25/18 1645  vancomycin (VANCOCIN) 1,500 mg in sodium chloride 0.9 % 500 mL IVPB  Status:  Discontinued     1,500 mg 250 mL/hr over 120 Minutes Intravenous  Once 12/25/18 1634 12/25/18 1648      Assessment/Plan:   ESRD on PD CAD s/p mLAD PCI with DES - Plavix on hold PVD HTN DM type II  49F s/p mechanicalground level falls Presacralfluid collection-clinically and historically this is an acute hematoma. -CT12/14w/2 x 1 x 1 cmphlegmon on CT scan that might be an infected hematoma after GLF on 12/9. - CT 12/16 w/enlarging fluid collection in the medial left buttock subcutaneous soft tissues measuring 2.9 x 1.9 cm - s/p bedside aspiration 12/17, aspirate appeared to be old blood - MRI 12/20 showed5.2 x 3.8 x 8.9 cmperisacral and coccygeal fluid collection with complex features. ID requesting re-aspiration and send for gram stain, aerobic and anaerobic culture -IR aspirated perisacral fluid collection 12/21.  Impression was fat necrosis versus infection.  Not purulent.  Gram stain negative.  Culture negative thus far. -Clinically the gluteal cleft process appears to be improving, and I do not think there is any immediate need to take to the OR for incision drainage and debridement.  This may be a self-limited process with antibiotics and sitz bath's  -Leukocytosis 40,000 is unexplained.  This does not correlate with the soft tissue process in her gluteal cleft.  Her abdomen is also benign and does not suggest active peritonitis.  FEN -Renal diet, NPO after MN VTE -sqHeparin  ID -Aztreonam/Flagyl/Vanc 12/14 >>. Per IDswitched to linezolid.  Plan: Continue antibiotics.  Sitz baths 3 times  daily.  No indication for operative debridement at this point.  Check cultures -We will follow closely.  Consider surgical drainage if she fails to continue to improve.   LOS: 8 days    Adin Hector 01/03/2019

## 2019-01-03 NOTE — Progress Notes (Signed)
[facility]  Date of Admission:  12/25/2018      Total days of antibiotics 10  Day 6 linezolid          ASSESSMENT: She is having some slow improvement on antibiotic therapy for presacral phlegmon. There are ?regarding pathology of her fluid collection. Her aspirate may reflect fat necrosis / infected hematoma vs infectious process. not overtly purulent with aspirate and culture without growth.  GI has seen and is planning flex sig tomorrow AM to rule out mass and for further guidance of infectious component. Would be helpful to send concerning tissue/purulence for analysis with not only path but microbiology as well to further guide antibiotic decisions.   Surgery continues to follow closely in the event she needs surgical drainage.   Leukocytosis slow to improve. No fevers noted.   Peritoneal fluid was re-sampled today and WBC now 33 with reduction in Neutrophil count to 68% indicating a response to antibiotic therapy (cell count +SBP but not clinically symptomatic)   PLAN: 1. Continue linezolid p.o. 2. Flex sig with GI tomorrow - follow up results  3. Micro/path cultures of abnormalities with #2     Active Problems:   Uncontrolled type 2 diabetes mellitus with peripheral neuropathy (HCC)   Essential hypertension, benign   CAD S/P mLAD PCI with DES   Presence of drug coated stent in LAD coronary artery   Elevated troponin   ESRD on dialysis ([place])   Symptomatic anemia   Gluteal abscess   Abscess of sacrum ([place])   Peritonitis ([place])   . sodium chloride   Intravenous Once  . acetaminophen  650 mg Oral Q6H  . amitriptyline  10 mg Oral QHS  . calcitRIOL  1.5 mcg Oral Daily  . darbepoetin (ARANESP) injection - DIALYSIS  100 mcg Subcutaneous Q Wed-1800  . gentamicin cream  1 application Topical Daily  . insulin aspart  0-6 Units Subcutaneous TID WC  . insulin glargine  8 Units Subcutaneous QHS  . linezolid  600 mg Oral Q12H  .  metoCLOPramide  5 mg Oral TID AC & HS  . metoprolol succinate  50 mg Oral Daily  . mometasone-formoterol  2 puff Inhalation BID  . oxyCODONE  10 mg Oral Q6H  . pantoprazole  40 mg Oral Daily  . polyethylene glycol  17 g Oral Daily  . pravastatin  20 mg Oral q1800  . pregabalin  75 mg Oral Daily  . senna-docusate  1 tablet Oral Daily    SUBJECTIVE: Ongoing pain. GI has seen her today and planing flex sig tomorrow 12/24   Review of Systems: Review of Systems  Constitutional: Negative for chills and fever.  HENT: Negative for tinnitus.   Eyes: Negative for blurred vision and photophobia.  Respiratory: Negative for cough and sputum production.   Cardiovascular: Negative for chest pain.  Gastrointestinal: Negative for diarrhea, nausea and vomiting.       Gluteal pain   Genitourinary: Negative for dysuria.  Musculoskeletal: Negative for myalgias.  Skin: Negative for rash.  Neurological: Negative for focal weakness and headaches.    Allergies  Allergen Reactions  . Repatha [Evolocumab] Itching  . Lisinopril Cough    OBJECTIVE: Vitals:   01/02/19 2153 01/03/19 0545 01/03/19 0650 01/03/19 0749  BP: 140/68 (!) 141/54 (!) 142/62   Pulse: 84 82 82 85  Resp: 17 16 16 18   Temp: 98.4 F (36.9 C) 97.9 F (36.6 C) 97.6 F (36.4  C)   TempSrc: Oral Oral Oral   SpO2: 99% 99% 98% 99%  Weight:   81.1 kg   Height:       Body mass index is 29.75 kg/m.   Physical Exam Vitals reviewed.  Constitutional:      Appearance: She is well-developed.     Comments: Resting in bed on right side, appears uncomfortable.  HENT:     Mouth/Throat:     Mouth: No oral lesions.     Dentition: Normal dentition. No dental abscesses.     Pharynx: No oropharyngeal exudate.  Cardiovascular:     Rate and Rhythm: Normal rate and regular rhythm.     Heart sounds: Normal heart sounds.  Pulmonary:     Effort: Pulmonary effort is normal.     Breath sounds: Normal breath sounds.  Abdominal:      General: There is no distension.     Palpations: Abdomen is soft.     Tenderness: There is no abdominal tenderness.  Lymphadenopathy:     Cervical: No cervical adenopathy.  Skin:    General: Skin is warm and dry.     Findings: No rash.  Neurological:     Mental Status: She is alert and oriented to person, place, and time.  Psychiatric:        Judgment: Judgment normal.     Lab Results Lab Results  Component Value Date   WBC 40.9 (H) 01/03/2019   HGB 8.6 (L) 01/03/2019   HCT 26.2 (L) 01/03/2019   MCV 82.9 01/03/2019   PLT 500 (H) 01/03/2019    Lab Results  Component Value Date   CREATININE 7.96 (H) 01/03/2019   BUN 52 (H) 01/03/2019   NA 129 (L) 01/03/2019   K 3.1 (L) 01/03/2019   CL 89 (L) 01/03/2019   CO2 26 01/03/2019    Lab Results  Component Value Date   ALT 15 12/25/2018   AST 14 (L) 12/25/2018   ALKPHOS 122 12/25/2018   BILITOT 0.8 12/25/2018     Microbiology: Recent Results (from the past 240 hour(s))  Culture, blood (routine x 2)     Status: None   Collection Time: 12/25/18 12:54 PM   Specimen: BLOOD  Result Value Ref Range Status   Specimen Description BLOOD RIGHT ANTECUBITAL  Final   Special Requests   Final    BOTTLES DRAWN AEROBIC AND ANAEROBIC Blood Culture adequate volume   Culture   Final    NO GROWTH 5 DAYS Performed at [facility], [address] [place], [place] [address]    Report Status 12/30/2018 FINAL  Final  SARS CORONAVIRUS 2 (TAT 6-24 HRS) Nasopharyngeal Nasopharyngeal Swab     Status: None   Collection Time: 12/25/18  5:24 PM   Specimen: Nasopharyngeal Swab  Result Value Ref Range Status   SARS Coronavirus 2 NEGATIVE NEGATIVE Final    Comment: (NOTE) SARS-CoV-2 target nucleic acids are NOT DETECTED. The SARS-CoV-2 RNA is generally detectable in upper and lower respiratory specimens during the acute phase of infection. Negative results do not preclude SARS-CoV-2 infection, do not rule out co-infections with other  pathogens, and should not be used as the sole basis for treatment or other patient management decisions. Negative results must be combined with clinical observations, patient history, and epidemiological information. The expected result is Negative. Fact Sheet for Patients: SugarRoll.be Fact Sheet for Healthcare Providers: https://www.woods-mathews.com/ This test is not yet approved or cleared by the [place] and  has been authorized  for detection and/or diagnosis of SARS-CoV-2 by FDA under an Emergency Use Authorization (EUA). This EUA will remain  in effect (meaning this test can be used) for the duration of the COVID-19 declaration under Section 56 4(b)(1) of the Act, 21 U.S.C. section 360bbb-3(b)(1), unless the authorization is terminated or revoked sooner. Performed at [facility], [place] [address], [place], [place] [address]   Culture, blood (Routine X 2) w Reflex to ID Panel     Status: None   Collection Time: 12/26/18  6:47 AM   Specimen: BLOOD RIGHT HAND  Result Value Ref Range Status   Specimen Description BLOOD RIGHT HAND  Final   Special Requests   Final    BOTTLES DRAWN AEROBIC ONLY Blood Culture results may not be optimal due to an inadequate volume of blood received in culture bottles   Culture   Final    NO GROWTH 5 DAYS Performed at [facility], [place] [address] [place], [place] [address]    Report Status 12/31/2018 FINAL  Final  Body fluid culture     Status: None   Collection Time: 12/26/18  3:03 PM   Specimen: Peritoneal Dialysis; Body Fluid  Result Value Ref Range Status   Specimen Description PERITONEAL DIALYSIS  Final   Special Requests NONE  Final   Gram Stain   Final    RARE WBC PRESENT, PREDOMINANTLY PMN NO ORGANISMS SEEN    Culture   Final    NO GROWTH 3 DAYS Performed at [facility] [address], [place], [place] [address]    Report Status 12/30/2018 FINAL  Final  Culture,  fungus without smear     Status: None (Preliminary result)   Collection Time: 01/01/19  4:49 PM   Specimen: PATH Other; Abscess  Result Value Ref Range Status   Specimen Description ABSCESS SACRAL  Final   Special Requests NONE  Final   Culture   Final    NO GROWTH 2 DAYS Performed at [facility], [address] [place], [place] [address]    Report Status PENDING  Incomplete  Aerobic/Anaerobic Culture (surgical/deep wound)     Status: None (Preliminary result)   Collection Time: 01/01/19  4:49 PM   Specimen: PATH Other; Abscess  Result Value Ref Range Status   Specimen Description ABSCESS SACRAL  Final   Special Requests NONE  Final   Gram Stain   Final    RARE WBC PRESENT, PREDOMINANTLY PMN NO ORGANISMS SEEN    Culture   Final    NO GROWTH 2 DAYS Performed at [facility], [place] [address], [place], [place] [address]    Report Status PENDING  Incomplete    Janene Madeira, MSN, NP-[facility] [email or web address] Pager: [phone] Office: [phone] [place]: [phone]

## 2019-01-04 ENCOUNTER — Encounter (HOSPITAL_COMMUNITY): Payer: Self-pay | Admitting: Family Medicine

## 2019-01-04 ENCOUNTER — Encounter (HOSPITAL_COMMUNITY)
Admission: EM | Disposition: A | Discharge status: Home / Self Care | Payer: Self-pay | Source: Home / Self Care | Attending: Family Medicine

## 2019-01-04 ENCOUNTER — Inpatient Hospital Stay (HOSPITAL_COMMUNITY): Payer: Medicare Other | Admitting: Anesthesiology

## 2019-01-04 DIAGNOSIS — Z9181 History of falling: Secondary | ICD-10-CM

## 2019-01-04 DIAGNOSIS — K651 Peritoneal abscess: Secondary | ICD-10-CM

## 2019-01-04 HISTORY — PX: FLEXIBLE SIGMOIDOSCOPY: SHX5431

## 2019-01-04 LAB — PATHOLOGIST SMEAR REVIEW

## 2019-01-04 LAB — CBC
HCT: 25.4 % — ABNORMAL LOW (ref 36.0–46.0)
Hemoglobin: 8.4 g/dL — ABNORMAL LOW (ref 12.0–15.0)
MCH: 27.5 pg (ref 26.0–34.0)
MCHC: 33.1 g/dL (ref 30.0–36.0)
MCV: 83.3 fL (ref 80.0–100.0)
Platelets: 475 10*3/uL — ABNORMAL HIGH (ref 150–400)
RBC: 3.05 MIL/uL — ABNORMAL LOW (ref 3.87–5.11)
RDW: 14.6 % (ref 11.5–15.5)
WBC: 41.3 10*3/uL — ABNORMAL HIGH (ref 4.0–10.5)
nRBC: 0 % (ref 0.0–0.2)

## 2019-01-04 LAB — RENAL FUNCTION PANEL
Albumin: 1.5 g/dL — ABNORMAL LOW (ref 3.5–5.0)
Anion gap: 12 (ref 5–15)
BUN: 55 mg/dL — ABNORMAL HIGH (ref 6–20)
CO2: 24 mmol/L (ref 22–32)
Calcium: 8.5 mg/dL — ABNORMAL LOW (ref 8.9–10.3)
Chloride: 94 mmol/L — ABNORMAL LOW (ref 98–111)
Creatinine, Ser: 8.01 mg/dL — ABNORMAL HIGH (ref 0.44–1.00)
GFR calc Af Amer: 6 mL/min — ABNORMAL LOW (ref >60)
GFR calc non Af Amer: 5 mL/min — ABNORMAL LOW (ref >60)
Glucose, Bld: 75 mg/dL (ref 70–99)
Phosphorus: 6.4 mg/dL — ABNORMAL HIGH (ref 2.5–4.6)
Potassium: 3.3 mmol/L — ABNORMAL LOW (ref 3.5–5.1)
Sodium: 130 mmol/L — ABNORMAL LOW (ref 135–145)

## 2019-01-04 LAB — GLUCOSE, CAPILLARY
Glucose-Capillary: 92 mg/dL (ref 70–99)
Glucose-Capillary: 59 mg/dL — ABNORMAL LOW (ref 70–99)
Glucose-Capillary: 75 mg/dL (ref 70–99)
Glucose-Capillary: 80 mg/dL (ref 70–99)
Glucose-Capillary: 138 mg/dL — ABNORMAL HIGH (ref 70–99)

## 2019-01-04 SURGERY — SIGMOIDOSCOPY, FLEXIBLE
Anesthesia: Monitor Anesthesia Care

## 2019-01-04 MED ORDER — HEPARIN 1000 UNIT/ML FOR PERITONEAL DIALYSIS: 500.0000 [IU] | INTRAMUSCULAR | Status: DC | PRN

## 2019-01-04 MED ORDER — LIDOCAINE HCL (CARDIAC) PF 100 MG/5ML IV SOSY
PREFILLED_SYRINGE | INTRAVENOUS | Status: DC | PRN
  Administered 2019-01-04: 20 mg via INTRATRACHEAL

## 2019-01-04 MED ORDER — DELFLEX-LC/1.5% DEXTROSE 344 MOSM/L IP SOLN: INTRAPERITONEAL | Status: DC

## 2019-01-04 MED ORDER — PROPOFOL 500 MG/50ML IV EMUL
INTRAVENOUS | Status: DC | PRN
  Administered 2019-01-04: 75 ug/kg/min via INTRAVENOUS

## 2019-01-04 MED ORDER — PROPOFOL 10 MG/ML IV BOLUS
INTRAVENOUS | Status: DC | PRN
  Administered 2019-01-04: 25 mg via INTRAVENOUS
  Administered 2019-01-04: 30 mg via INTRAVENOUS

## 2019-01-04 MED ORDER — GENTAMICIN SULFATE 0.1 % EX CREA
1.0000 "application " | TOPICAL_CREAM | Freq: Every day | CUTANEOUS | Status: DC
  Administered 2019-01-04 – 2019-01-05 (×2): 1 via TOPICAL
  Filled 2019-01-04: qty 15

## 2019-01-04 MED ORDER — SODIUM CHLORIDE 0.9 % IV SOLN: INTRAVENOUS | Status: DC | PRN

## 2019-01-04 MED ORDER — HEPARIN 1000 UNIT/ML FOR PERITONEAL DIALYSIS
INTRAPERITONEAL | Status: DC | PRN
  Filled 2019-01-04: qty 5000

## 2019-01-04 NOTE — Anesthesia Procedure Notes (Signed)
Procedure Name: MAC Date/Time: 01/04/2019 9:41 AM Performed by: Neldon Newport, CRNA Pre-anesthesia Checklist: Patient identified, Emergency Drugs available, Suction available, Patient being monitored and Timeout performed Oxygen Delivery Method: Simple face mask

## 2019-01-04 NOTE — Op Note (Signed)
[facility] Patient Name: Susan Horton Procedure Date : 01/04/2019 MRN: [id number] Attending MD: Clarene Essex , MD Date of Birth: 1970-10-13 CSN: [id number] Age: 48 Admit Type: Inpatient Procedure:                Flexible Sigmoidoscopy Indications:              Abnormal CT of the GI tract, Abnormal MRI of the GI                            tract Providers:                Clarene Essex, MD, Baird Cancer, RN, Benay Pillow,                            RN, Lina Sar, Technician, Janeece Agee,                            Technician, Neldon Newport CRNA, CRNA Referring MD:              Medicines:                Propofol total dose 200 mg IV, 40 mg IV lidocaine Complications:            No immediate complications. Estimated Blood Loss:     Estimated blood loss: none. Procedure:                Pre-Anesthesia Assessment:                           - Prior to the procedure, a History and Physical                            was performed, and patient medications and                            allergies were reviewed. The patient's tolerance of                            previous anesthesia was also reviewed. The risks                            and benefits of the procedure and the sedation                            options and risks were discussed with the patient.                            All questions were answered, and informed consent                            was obtained. Prior Anticoagulants: The patient has                            taken Plavix (clopidogrel), last dose was 4 days  prior to procedure. ASA Grade Assessment: III - A                            patient with severe systemic disease. After                            reviewing the risks and benefits, the patient was                            deemed in satisfactory condition to undergo the                            procedure.                           After obtaining informed  consent, the scope was                            passed under direct vision. The PCF-H190DL                            ([id number]) Olympus pediatric colonscope was                            introduced through the anus and advanced to the the                            splenic flexure. The flexible sigmoidoscopy was                            accomplished without difficulty. The patient                            tolerated the procedure well. The quality of the                            bowel preparation was adequate. Scope In: 9:59:57 AM Scope Out: 10:05:09 AM Total Procedure Duration: 0 hours 5 minutes 12 seconds  Findings:      Hemorrhoids were found on perianal exam.      External and internal hemorrhoids were found during retroflexion, during       perianal exam and during digital exam. The hemorrhoids were medium-sized.      The rectum, sigmoid colon, descending colon and splenic flexure appeared       normal.      The exam was otherwise without abnormality. Impression:               - Hemorrhoids found on perianal exam.                           - External and internal hemorrhoids.                           - The rectum, sigmoid colon, descending colon and  splenic flexure are normal.                           - The examination was otherwise normal.                            Specifically no signs of fistula or rectal                            abnormality mass etc.                           - No specimens collected. Recommendation:           - Soft diet today. Please call us back if I can be                            of any further assistance with this hospital stay                           - Continue present medications.                           - Return to GI clinic PRN.                           - Telephone GI clinic if symptomatic PRN. Procedure Code(s):        --- Professional ---                           [phone], Sigmoidoscopy, flexible;  diagnostic,                            including collection of specimen(s) by brushing or                            washing, when performed (separate procedure) Diagnosis Code(s):        --- Professional ---                           R93.3, Abnormal findings on diagnostic imaging of                            other parts of digestive tract CPT copyright 2019 American Medical Association. All rights reserved. The codes documented in this report are preliminary and upon coder review may  be revised to meet current compliance requirements. Clarene Essex, MD 01/04/2019 10:17:24 AM This report has been signed electronically. Number of Addenda: 0

## 2019-01-04 NOTE — Progress Notes (Signed)
Susan Horton 9:49 AM  Subjective: Patient only able to drink 2 glasses but did have some bowel movements and no new complaints  Objective: Vital signs stable afebrile exam please see preassessment evaluation no new labs  Assessment: Abnormal CT  Plan: Okay for flex sig with anesthesia assistance  [facility]  office [phone] After 5PM or if no answer call [phone]

## 2019-01-04 NOTE — Plan of Care (Signed)

## 2019-01-04 NOTE — Evaluation (Signed)
Physical Therapy Evaluation Patient Details Name: Susan Horton MRN: [id number] DOB: 06/01/1970 Today's Date: 01/04/2019   History of Present Illness  Patient is a 48 y/o female who presents with Pain in her lower back and butt, found to have sacral phlegmon. Also noted to have diabetic gastroparesis with frequent vomiting and Leukocytosis. s/p aspiration of sacral fluid 12/21.  PMH is significant for ESRD on home PD, T2DM, HTN, CHF, PVD.  Clinical Impression  Patient presents with generalized weakness, pain, decreased activity tolerance and impaired mobility s/p above. Pt reports being mostly independent PTA with occasional help needed from her sister/friend and uses SPC for ambulation PRN. Today, pt tolerated bed mobility, transfers and gait training with Min guard and RW for safety. Pt with 2/4 DOE and needed 1 standing rest break. VSS. Reports pain and not able to sit/lay on bottom. Would benefit from use of rollator to improve safety and endurance. Pt with hx of falls and is limited with distance due to weakness and SOB. Will follow acutely to maximize independence and mobility prior to return home. Will follow acutely.    Follow Up Recommendations Home health PT;Supervision - Intermittent    Equipment Recommendations  Other (comment)(4 wheeled walker (rollator with seat)- only if insurance covers it)    Recommendations for Other Services       Precautions / Restrictions Precautions Precautions: Fall Precaution Comments: Fall leading to admission Restrictions Weight Bearing Restrictions: No      Mobility  Bed Mobility Overal bed mobility: Needs Assistance Bed Mobility: Rolling;Sidelying to Sit Rolling: Supervision Sidelying to sit: Supervision;HOB elevated       General bed mobility comments: Difficulty with rolling due to bottom being sore. Crawls to get into bed.  Transfers Overall transfer level: Needs assistance Equipment used: None Transfers: Sit to/from Stand Sit  to Stand: Min guard         General transfer comment: Min guard for safety. Slow to rise.  Ambulation/Gait Ambulation/Gait assistance: Min guard Gait Distance (Feet): 140 Feet Assistive device: Rolling walker (2 wheeled) Gait Pattern/deviations: Step-through pattern;Decreased stride length Gait velocity: decreased   General Gait Details: Slow, guarded and mildly unsteady gait with right knee instability but no buckling. 2/4 DOE VSS. 1 standing rest break. Walked into bathroom without DME, furniture walking.  Stairs            Wheelchair Mobility    Modified Rankin (Stroke Patients Only)       Balance Overall balance assessment: Needs assistance Sitting-balance support: Feet supported;No upper extremity supported Sitting balance-Leahy Scale: Good     Standing balance support: During functional activity Standing balance-Leahy Scale: Fair Standing balance comment: Able to stand statically withuot UE support; does better witih UE support for dynamic activities/walking.                             Pertinent Vitals/Pain Pain Assessment: 0-10 Pain Score: 7  Pain Location: glutes Pain Descriptors / Indicators: Sore Pain Intervention(s): Monitored during session;Repositioned;Premedicated before session    Home Living Family/patient expects to be discharged to:: Private residence Living Arrangements: Other relatives Available Help at Discharge: Friend(s);Available PRN/intermittently Type of Home: House Home Access: Stairs to enter Entrance Stairs-Rails: Right Entrance Stairs-Number of Steps: 3 Home Layout: One level Home Equipment: Cane - single point Additional Comments: Reports her grandkids are there if her friend is not.    Prior Function Level of Independence: Independent with assistive device(s)  Comments: Sister helps with ADLs as needed. Uses SPC as needed. Drives sometimes.     Hand Dominance        Extremity/Trunk Assessment    Upper Extremity Assessment Upper Extremity Assessment: Defer to OT evaluation    Lower Extremity Assessment Lower Extremity Assessment: Generalized weakness(reports hips go numb due to laying on them so much)       Communication   Communication: No difficulties  Cognition Arousal/Alertness: Awake/alert Behavior During Therapy: WFL for tasks assessed/performed Overall Cognitive Status: Within Functional Limits for tasks assessed                                        General Comments General comments (skin integrity, edema, etc.): VSS.    Exercises     Assessment/Plan    PT Assessment Patient needs continued PT services  PT Problem List Decreased strength;Decreased mobility;Pain;Decreased activity tolerance;Decreased skin integrity       PT Treatment Interventions Therapeutic activities;Gait training;Therapeutic exercise;Patient/family education;Balance training;Stair training;Functional mobility training    PT Goals (Current goals can be found in the Care Plan section)  Acute Rehab PT Goals Patient Stated Goal: to get this pain controlled PT Goal Formulation: With patient Time For Goal Achievement: 01/18/19 Potential to Achieve Goals: Good    Frequency Min 3X/week   Barriers to discharge        Co-evaluation               AM-PAC PT "6 Clicks" Mobility  Outcome Measure Help needed turning from your back to your side while in a flat bed without using bedrails?: A Little Help needed moving from lying on your back to sitting on the side of a flat bed without using bedrails?: A Little Help needed moving to and from a bed to a chair (including a wheelchair)?: A Little Help needed standing up from a chair using your arms (e.g., wheelchair or bedside chair)?: A Little Help needed to walk in hospital room?: A Little Help needed climbing 3-5 steps with a railing? : A Little 6 Click Score: 18    End of Session Equipment Utilized During  Treatment: Gait belt Activity Tolerance: Patient tolerated treatment well Patient left: in bed;with call bell/phone within reach Nurse Communication: Mobility status PT Visit Diagnosis: Muscle weakness (generalized) (M62.81);Difficulty in walking, not elsewhere classified (R26.2)    Time: 1340-1400 PT Time Calculation (min) (ACUTE ONLY): 20 min   Charges:   PT Evaluation $PT Eval Moderate Complexity: 1 Mod          Marisa Severin, PT, DPT Acute Rehabilitation Services Pager [phone] Office [phone]      Marguarite Arbour A Sabra Heck 01/04/2019, 3:33 PM

## 2019-01-04 NOTE — Progress Notes (Signed)
Hypoglycemic Event  CBG: 59  Treatment: 4 oz juice    Symptoms: None  Follow-up CBG: Time:1308 CBG Result:75  Possible Reasons for Event: Other: patient was NPO for procedure, did not eat much of her meal upon return to unit following procedure.  Comments/MD notified:MD notified, orders to continue to monitor and treat per protocol.    Erle Crocker

## 2019-01-04 NOTE — Anesthesia Preprocedure Evaluation (Addendum)
Anesthesia Evaluation  Patient identified by MRN, date of birth, ID band Patient awake    Reviewed: Allergy & Precautions, NPO status , Patient's Chart, lab work & pertinent test results  History of Anesthesia Complications Negative for: history of anesthetic complications  Airway Mallampati: II  TM Distance: >3 FB Neck ROM: Full    Dental  (+) Dental Advisory Given   Pulmonary asthma , former smoker,    Pulmonary exam normal        Cardiovascular hypertension, Pt. on medications and Pt. on home beta blockers (-) angina+ CAD, + Cardiac Stents, + Peripheral Vascular Disease and + DVT  Normal cardiovascular exam   '20 TTE - EF 60 to 65%. Mildly increased left ventricular hypertrophy.Trivial AI, MR, and PR. Mild TR. Mild aortic valve sclerosis without stenosis.  '20 Myoperfusion - Nuclear stress EF: 44%. The left ventricular ejection fraction is moderately decreased (30-44%). There was no ST segment deviation noted during stress. This is a low risk study. There is no evidence of ischemia or previous myocardial infarction. The study is normal.  '20 Carotid US - Right Carotid: Velocities in the right ICA are consistent with a 1-39% stenosis. Left Carotid: Velocities in the left ICA are consistent with a 40-59% stenosis.\   Neuro/Psych  Headaches, negative psych ROS   GI/Hepatic Neg liver ROS, GERD  Medicated and Controlled, Gastroparesis   Endo/Other  diabetes, Type 2, Insulin Dependent, Oral Hypoglycemic Agents Hypokalemia Hyponatremia Hypocalcemia Hyperuricemia   Renal/GU ESRFRenal disease     Musculoskeletal negative musculoskeletal ROS (+)   Abdominal   Peds  Hematology  (+) anemia ,  WBC > 40k   Anesthesia Other Findings Covid neg 12/14  Reproductive/Obstetrics                           Anesthesia Physical Anesthesia Plan  ASA: IV  Anesthesia Plan: MAC   Post-op Pain  Management:    Induction: Intravenous  PONV Risk Score and Plan: 2 and Propofol infusion and Treatment may vary due to age or medical condition  Airway Management Planned: Natural Airway and Simple Face Mask  Additional Equipment: None  Intra-op Plan:   Post-operative Plan:   Informed Consent: I have reviewed the patients History and Physical, chart, labs and discussed the procedure including the risks, benefits and alternatives for the proposed anesthesia with the patient or authorized representative who has indicated his/her understanding and acceptance.       Plan Discussed with: CRNA and Anesthesiologist  Anesthesia Plan Comments:        Anesthesia Quick Evaluation

## 2019-01-04 NOTE — Progress Notes (Signed)
Family Medicine Teaching Service Daily Progress Note Intern Pager: [phone]  Patient name: Susan Horton Medical record number: [id number] Date of birth: December 30, 1970 Age: 48 y.o. Gender: female  Primary Horton Provider: Charlott Rakes, MD Consultants: Surgery, nephrology, IR, ID Code Status: Full  Pt Overview and Major Events to Date:  12/25/18 admitted to [facility] [place], aztreonam, vanc and metronidazole started 12/26/18 Vanc discontinued 12/27/18 Linezolid started 12/28/18 aztreonam discontinued, unasyn started 12/29/18 metronidazole and unasyn discontinued 01/01/19 aspiration of perirectal abscess  Assessment and Plan: Susan L Jonesis a 48 y.o.femalepresenting with sacral phlegmon. PMH is significant forESRD on home PD, T2DM, HTN, CHF,PVD.  Sacral phlegmon-stable, no change WBC improving 49.4>41.6>40.9 (December 24 labs pending).  Colonoscopy planned for December 24, patient did not drink full prep. S/p aspiration by IR on 12/21, NG X 2 days -Continue abx: linezolid (12/18-) -ID following, appreciate recommendations, see above -General surgery following appreciate recommendations -Will consult GI today given continued symptoms -Pain control: scheduled tylenol, scheduled oxy IR q6h 10mg  with 5 mg q3h prn -Vitals per routine  Leukocytosis  thrombocytosis , 12/24 labs pending Please secondary to ongoing infection however source still under investigation and remains unclear . Pt remains afebrile overnight.   WBC 49.4>41.6>40.9, improving Pt continues on linezolid as above.  Aspiration of pericecal fluid on 12/21.  Gram stain negative.  We will check pending continue to monitor. -Consider heme consult given anemia with leukocytosis and mild thrombocytosis  Anemia 12/24 labs pending Unknown etiology at this time but likely 2/2 on going infection.  Hgb 8.6 12/23, stable, s/p 1U pRBC's on 12/21 Transfusion threshold 8 given CAD.  -Continue to monitor on CBC. Monitor for signs of  bleeding -Consulting GI for possible malignancy   ESRD on home PD Home medications include PhosLo, calcitriol. Missed PD yesterday in hopes of getting MRI.   -Nephrology consulted, appreciate recommendations  Hypokalemia-12/24 labs pending, no change in clinical status From 12/23 K  3.1 . Treated with Kdur 40 mEq  -Continue to monitor K with daily BMP  Constipation, resolved with bowel prep Continue to monitor  CAD s/p stent placement Follows with Susan Horton.Patient has history of CAD and is on aspirin, Plavix and metoprolol. Patient had drug-eluting stent placed in 2018.  -Restart home aspirin and Plavix -Continue home metoprolol  PAD Medications include Plavix, aspirin, lovastatin 20 mg daily -Holding home Plavix and ASA -Pravastatin 20 mg due to it being formulary in the hospital.  HFpEF Echo from 2019 showed EF of 55 to 60% with G2DD.Home medications as metoprolol XL -Monitor signs and symptoms of heart failure -Continue home metoprolol XL dose  T2 DMwith peripheral neuropathy Patient's home medications include Toujeo 25 units twice dailyand humulin SSI.AM CBG 122. No SSI needed today  -Holding home Toujeo -Sensitive sliding scale - 8 units Lantus daily -Continue home Lyrica and amitriptyline -CBGs with meals  Gastroparesis Patient has history of gastroparesis for which she takes metoclopramide 5 mg at home. -Continue home metoclopramide  HTN Patient's home medications include Toprol-XL 50 mg daily -Continue home medications  Asthma Home medications include SingulairPRNat bedtime, Advair twice daily and Ventolin as needed. -Continue home asthma medications -Holding Singulair    FEN/GI: Renal/carb modified PPx: SCDs  Disposition: pending ID and gen sx recommendations (colonoscopy expected 12/24)  Subjective:  Patient said she was not able to drink all the bowel prep "I just could not drink anymore ", she said she did have a  large bowel movement but denies any bleeding. She said she  has no change in her status and no new complaints at this time. She says she just wants answers  Objective: Temp:  [97.7 F (36.5 C)-98.6 F (37 C)] 98.3 F (36.8 C) (12/24 0655) Pulse Rate:  [82-85] 83 (12/24 0655) Resp:  [17-18] 18 (12/24 0655) BP: (115-136)/(50-60) 115/60 (12/24 0655) SpO2:  [95 %-100 %] 95 % (12/24 0655) Weight:  [81.7 kg] 81.7 kg (12/23 1820)  General: Alert, cooperative, had been using restroom when I arrived, laying on left side, ambulating without problem Cardio: Normal rate and rhythm, no murmurs identified Pulm: No work of breathing, CTA B Abdomen: No tenderness GU exam deferred Extremities: No edema, ambulating without assistance Neuro: No gross deficits  Laboratory: Recent Labs  Lab 01/02/19 0227 01/02/19 0701 01/03/19 0208  WBC 41.7* 41.6* 40.9*  HGB 8.1* 8.6* 8.6*  HCT 24.3* 26.8* 26.2*  PLT 447* 493* 500*   Recent Labs  Lab 01/02/19 0227 01/02/19 0701 01/03/19 0208  NA 127* 128* 129*  K 2.9* 2.9* 3.1*  CL 88* 87* 89*  CO2 22 24 26   BUN 52* 55* 52*  CREATININE 8.27* 8.58* 7.96*  CALCIUM 8.2* 8.6* 8.5*  GLUCOSE 134* 80 150*    Imaging/Diagnostic Tests:  Susan Sires, DO 01/04/2019, 7:41 AM PGY-3, [place] Intern pager: [phone], text pages welcome

## 2019-01-04 NOTE — Progress Notes (Signed)
Pt encouraged to drink prep for colonoscopy in am. Pt reports that "she just cant drink anymore." Will notify am nurse. Instructed pt in NPO status after midnight.

## 2019-01-04 NOTE — Progress Notes (Addendum)
[facility] Progress Note   Dialysis Orders: CCPD - 1 2.5 5 L bag and 1 1.5 5 L bag - 5 fills - 2 L fills with 1 L in last fill dwell 1.5 hr  Assessment/Plan: 1. Sacaral/coccygeal/gluteal abscess -Surgery aspirated sacral fluidagain 12/22small amount tan fluid - neg gram stain cultures pending  ID consulting. MRI showed possible osteomyelitis.  ID consulting- abtx per primary- currently on linezolid. Per GI not for flex sig today to r/o mass 2. Leukocytosis -WBC 25 >33 K >38.9>41.6>49.4>47.8> 45.> 41.6 > 40.6  Gluteal abscess vs leukocytosis. . Does not have any symptoms of perotinits 2. Gastroparesis - chronic problem -on chronic Reglan 5 mg.  3.Peritonitis - cell count 333 - with 95% neutrophils - neg culture No abdominal pain.Per radiology eval of 12/14 CT scan there is no communication of the abscess with the peritoneum  Repeat cell count 12/23  13 4. ESRDwith  hypokalemia - blood today will offer some K- on PD- continue 1.5% solution. Has an AVF so could easily transition to HD if she had surgery for abscess that might warrant a holiday from PDor if PD catheter had to be removed.No communication with peritoneum on 12/14 scan per radiology.PD cath clotting, improved using heparinized solution.  Labs ordered for today 5. Anemia- hgb 10.3 >8.7>9.2>7.5>6.8 tx 1 unit > 8.6  held oral Fe to lessen pill burden with gastroparesis - increase Aranesp 168mcg qwk - has had in the past here - gets Mircera as outpatient (last dose in Sept) CBC ordered Fe 59 ferritin 1917  6. Secondary hyperparathyroidism- Ca 8.3 on 3 Ca acetate ac - was heldto lessen pill burdenand with nausea P 6.1 boderline- resume at d/c - continue calcitriol. 7.HTN/volume- BP variable- creeping up with low Na - will use all 2.5s tonight on dialysis  On Toprol . Low net UFs increased to all 2.5 last night with better net UF 952- also had colonsocpy prep - plan 1/2 2.5 and 1.5 tonight 8 . DM-  per primary 9. Nutrition - alb low -liberalized diet to regular to augment potassium intake and increase options   Myriam Jacobson, PA-C [facility] Beeper [phone] 01/04/2019,8:39 AM  LOS: 9 days   Subjective:   Denies problems with PD last night inspite of having colonscopy  pre - pooped all night   Objective Vitals:   01/03/19 2208 01/04/19 0521 01/04/19 0655 01/04/19 0809  BP: (!) 119/50 (!) 130/51 115/60   Pulse: 82 82 83 85  Resp: 18 17 18 16   Temp: 98.6 F (37 C) 97.9 F (36.6 C) 98.3 F (36.8 C)   TempSrc: Oral Oral Oral   SpO2: 100% 98% 95% 95%  Weight:      Height:       Physical Exam exam deferred - sitting on commode in bathroom General:  Heart:  Lungs:  Abdomen:  Extremities: no LE edema Dialysis Access: PD cath intact   Additional Objective Labs: Basic Metabolic Panel: Recent Labs  Lab 01/01/19 0416 01/02/19 0227 01/02/19 0701 01/03/19 0208  NA 128* 127* 128* 129*  K 3.3* 2.9* 2.9* 3.1*  CL 89* 88* 87* 89*  CO2 24 22 24 26   GLUCOSE 98 134* 80 150*  BUN 53* 52* 55* 52*  CREATININE 8.57* 8.27* 8.58* 7.96*  CALCIUM 8.3* 8.2* 8.6* 8.5*  PHOS 6.1*  --   --  5.8*   Liver Function Tests: Recent Labs  Lab 01/03/19 0208  ALBUMIN 1.4*   No results for input(s):  LIPASE, AMYLASE in the last 168 hours. CBC: Recent Labs  Lab 12/29/18 0454 12/31/18 1655 01/01/19 0416 01/02/19 0227 01/02/19 0701 01/03/19 0208  WBC 41.6*  42.4* 47.8* 45.5* 41.7* 41.6* 40.9*  NEUTROABS 36.3*  --   --   --   --   --   HGB 9.2*  9.2* 7.5* 6.8* 8.1* 8.6* 8.6*  HCT 29.9*  30.0* 23.7* 21.3* 24.3* 26.8* 26.2*  MCV 86.2  86.2 84.3 83.5 81.3 84.0 82.9  PLT 490*  499* 518* 522* 447* 493* 500*   Blood Culture    Component Value Date/Time   SDES ABSCESS SACRAL 01/01/2019 1649   SDES ABSCESS SACRAL 01/01/2019 1649   SPECREQUEST NONE 01/01/2019 1649   SPECREQUEST NONE 01/01/2019 1649   CULT  01/01/2019 1649    NO GROWTH 2 DAYS Performed at  [facility], [place] [address], [place], [place] [address]    CULT  01/01/2019 1649    NO GROWTH 2 DAYS Performed at [facility], [place] [address] [place], [place] [address]    REPTSTATUS PENDING 01/01/2019 1649   REPTSTATUS PENDING 01/01/2019 1649    Cardiac Enzymes: No results for input(s): CKTOTAL, CKMB, CKMBINDEX, TROPONINI in the last 168 hours. CBG: Recent Labs  Lab 01/03/19 0805 01/03/19 1232 01/03/19 1833 01/03/19 2124 01/04/19 0821  GLUCAP 122* 103* 81 178* 92   Iron Studies:  Recent Labs    01/03/19 1040  IRON 59  TIBC NOT CALCULATED  FERRITIN 1,917*   Lab Results  Component Value Date   INR 1.2 01/01/2019   INR 0.91 02/01/2017   INR 0.9 07/26/2016   Studies/Results: No results found. Medications: . sodium chloride    . dialysis solution 2.5% low-MG/low-CA     . sodium chloride   Intravenous Once  . acetaminophen  650 mg Oral Q6H  . amitriptyline  10 mg Oral QHS  . calcitRIOL  1.5 mcg Oral Daily  . darbepoetin (ARANESP) injection - DIALYSIS  100 mcg Subcutaneous Q Wed-1800  . gentamicin cream  1 application Topical Daily  . insulin aspart  0-6 Units Subcutaneous TID WC  . insulin glargine  8 Units Subcutaneous QHS  . linezolid  600 mg Oral Q12H  . metoCLOPramide  5 mg Oral TID AC & HS  . metoprolol succinate  50 mg Oral Daily  . mometasone-formoterol  2 puff Inhalation BID  . oxyCODONE  10 mg Oral Q6H  . pantoprazole  40 mg Oral Daily  . polyethylene glycol  17 g Oral Daily  . pravastatin  20 mg Oral q1800  . pregabalin  75 mg Oral Daily  . senna-docusate  1 tablet Oral Daily

## 2019-01-04 NOTE — Anesthesia Postprocedure Evaluation (Signed)
Anesthesia Post Note  Patient: Susan Horton  Procedure(s) Performed: FLEXIBLE SIGMOIDOSCOPY (N/A )     Patient location during evaluation: PACU Anesthesia Type: MAC Level of consciousness: awake and alert Pain management: pain level controlled Vital Signs Assessment: post-procedure vital signs reviewed and stable Respiratory status: spontaneous breathing, nonlabored ventilation and respiratory function stable Cardiovascular status: stable and blood pressure returned to baseline Anesthetic complications: no    Last Vitals:  Vitals:   01/04/19 1015 01/04/19 1027  BP: (!) 144/36 (!) 121/42  Pulse: 85 87  Resp: 14 (!) 21  Temp: 36.5 C   SpO2: 100% 99%                 Audry Pili

## 2019-01-04 NOTE — Progress Notes (Signed)
PT Cancellation Note  Patient Details Name: Susan Horton MRN: [id number] DOB: 12-18-1970   Cancelled Treatment:    Reason Eval/Treat Not Completed: Patient at procedure or test/unavailable Pt off floor for procedure. Will follow.   Marguarite Arbour A Laityn Bensen 01/04/2019, 10:20 AM Marisa Severin, PT, DPT Acute Rehabilitation Services Pager [phone] Office [phone]

## 2019-01-04 NOTE — Care Management (Signed)
PT requesting )(4 wheeled walker (rollator with seat)- , ordered same with [place] with [facility] . [employer] will check insurance and  discuss cost with patient.  Magdalen Spatz RN

## 2019-01-04 NOTE — Transfer of Care (Signed)
Immediate Anesthesia Transfer of Care Note  Patient: Susan Horton  Procedure(s) Performed: FLEXIBLE SIGMOIDOSCOPY (N/A )  Patient Location: Endoscopy Unit  Anesthesia Type:MAC  Level of Consciousness: awake, alert  and oriented  Airway & Oxygen Therapy: Patient Spontanous Breathing  Post-op Assessment: Report given to RN, Post -op Vital signs reviewed and stable and Patient moving all extremities X 4  Post vital signs: Reviewed and stable  Last Vitals:  Vitals Value Taken Time  BP    Temp    Pulse    Resp    SpO2      Last Pain:  Vitals:   01/04/19 0919  TempSrc: Temporal  PainSc: 7       Patients Stated Pain Goal: 0 (17/49/44 9675)  Complications: No apparent anesthesia complications

## 2019-01-04 NOTE — Progress Notes (Signed)
Subjective: No new complaints   Antibiotics:  Anti-infectives (From admission, onward)   Start     Dose/Rate Route Frequency Ordered Stop   01/02/19 2200  linezolid (ZYVOX) tablet 600 mg     600 mg Oral Every 12 hours 01/02/19 1431     01/01/19 1800  fluconazole (DIFLUCAN) IVPB 200 mg  Status:  Discontinued     200 mg 100 mL/hr over 60 Minutes Intravenous Every 24 hours 01/01/19 0820 01/01/19 1204   12/31/18 1500  fluconazole (DIFLUCAN) IVPB 400 mg     400 mg 100 mL/hr over 120 Minutes Intravenous NOW 12/31/18 1413 12/31/18 1715   12/29/18 1400  linezolid (ZYVOX) IVPB 600 mg  Status:  Discontinued     600 mg 300 mL/hr over 60 Minutes Intravenous Every 12 hours 12/29/18 1312 01/02/19 1431   12/28/18 1800  ampicillin-sulbactam (UNASYN) 1.5 g in sodium chloride 0.9 % 100 mL IVPB  Status:  Discontinued     1.5 g 200 mL/hr over 30 Minutes Intravenous Every 12 hours 12/28/18 1429 12/29/18 1311   12/28/18 1230  amoxicillin (AMOXIL) capsule 500 mg     500 mg Oral  Once 12/28/18 1218 12/28/18 1253   12/28/18 1115  penicillin G in sodium chloride 10,000 units/mL syringe for skin test - 1st bleb     1,000 Units Intradermal  Once 12/28/18 1034 12/28/18 1130   12/28/18 1115  penicillin G in sodium chloride 10,000 units/mL syringe for skin test - 2nd bleb     1,000 Units Intradermal  Once 12/28/18 1034 12/28/18 1130   12/28/18 1100  penicillin G in sodium chloride 10,000 units/mL syringe for skin test     1,000 Units Topical  Once 12/28/18 1034 12/28/18 1130   12/28/18 1030  penicillin G in sodium chloride 10,000 units/mL syringe for skin test  Status:  Discontinued     1,000 Units Topical  Once 12/28/18 1018 12/28/18 1034   12/28/18 1030  penicillin G in sodium chloride 10,000 units/mL syringe for skin test - 1st bleb  Status:  Discontinued     1,000 Units Intradermal  Once 12/28/18 1018 12/28/18 1034   12/28/18 1030  penicillin G in sodium chloride 10,000 units/mL syringe for skin  test - 2nd bleb  Status:  Discontinued     1,000 Units Intradermal  Once 12/28/18 1018 12/28/18 1034   12/26/18 0130  aztreonam (AZACTAM) 0.5 g in dextrose 5 % 50 mL IVPB  Status:  Discontinued     0.5 g 100 mL/hr over 30 Minutes Intravenous Every 8 hours 12/25/18 1858 12/28/18 1429   12/26/18 0000  vancomycin variable dose per unstable renal function (pharmacist dosing)  Status:  Discontinued      Does not apply See admin instructions 12/25/18 1634 12/28/18 1139   12/25/18 2200  aztreonam (AZACTAM) 0.5 g in dextrose 5 % 50 mL IVPB  Status:  Discontinued     0.5 g 100 mL/hr over 30 Minutes Intravenous Every 12 hours 12/25/18 1634 12/25/18 1646   12/25/18 1900  metroNIDAZOLE (FLAGYL) IVPB 500 mg  Status:  Discontinued     500 mg 100 mL/hr over 60 Minutes Intravenous Every 8 hours 12/25/18 1858 12/29/18 1327   12/25/18 1700  vancomycin (VANCOCIN) 2,000 mg in sodium chloride 0.9 % 500 mL IVPB  Status:  Discontinued     2,000 mg 250 mL/hr over 120 Minutes Intravenous  Once 12/25/18 1648 12/25/18 2030   12/25/18 1646  aztreonam (AZACTAM) 0.5  g in dextrose 5 % 50 mL IVPB  Status:  Discontinued     0.5 g 100 mL/hr over 30 Minutes Intravenous Every 8 hours 12/25/18 1646 12/25/18 1851   12/25/18 1645  vancomycin (VANCOCIN) IVPB 1000 mg/200 mL premix  Status:  Discontinued     1,000 mg 200 mL/hr over 60 Minutes Intravenous  Once 12/25/18 1631 12/25/18 1634   12/25/18 1645  aztreonam (AZACTAM) 2 g in sodium chloride 0.9 % 100 mL IVPB  Status:  Discontinued     2 g 200 mL/hr over 30 Minutes Intravenous  Once 12/25/18 1631 12/25/18 1634   12/25/18 1645  vancomycin (VANCOCIN) IVPB 1000 mg/200 mL premix  Status:  Discontinued     1,000 mg 200 mL/hr over 60 Minutes Intravenous  Once 12/25/18 1632 12/25/18 1634   12/25/18 1645  aztreonam (AZACTAM) 1 g in sodium chloride 0.9 % 100 mL IVPB  Status:  Discontinued     1 g 200 mL/hr over 30 Minutes Intravenous  Once 12/25/18 1632 12/25/18 1634   12/25/18  1645  vancomycin (VANCOCIN) 1,500 mg in sodium chloride 0.9 % 500 mL IVPB  Status:  Discontinued     1,500 mg 250 mL/hr over 120 Minutes Intravenous  Once 12/25/18 1634 12/25/18 1648      Medications: Scheduled Meds: . sodium chloride   Intravenous Once  . acetaminophen  650 mg Oral Q6H  . amitriptyline  10 mg Oral QHS  . calcitRIOL  1.5 mcg Oral Daily  . darbepoetin (ARANESP) injection - DIALYSIS  100 mcg Subcutaneous Q Wed-1800  . gentamicin cream  1 application Topical Daily  . insulin aspart  0-6 Units Subcutaneous TID WC  . insulin glargine  8 Units Subcutaneous QHS  . linezolid  600 mg Oral Q12H  . metoCLOPramide  5 mg Oral TID AC & HS  . metoprolol succinate  50 mg Oral Daily  . mometasone-formoterol  2 puff Inhalation BID  . oxyCODONE  10 mg Oral Q6H  . pantoprazole  40 mg Oral Daily  . polyethylene glycol  17 g Oral Daily  . pravastatin  20 mg Oral q1800  . pregabalin  75 mg Oral Daily  . senna-docusate  1 tablet Oral Daily   Continuous Infusions: . dialysis solution 1.5% low-MG/low-CA     PRN Meds:.albuterol, EPINEPHrine, heparin, dianeal solution for CAPD/CCPD with heparin, dianeal solution for CAPD/CCPD with heparin, oxyCODONE    Objective: Weight change: 0.6 kg  Intake/Output Summary (Last 24 hours) at 01/04/2019 1355 Last data filed at 01/04/2019 1234 Gross per 24 hour  Intake 572.24 ml  Output 0 ml  Net 572.24 ml   Blood pressure (!) 121/42, pulse 87, temperature 97.7 F (36.5 C), temperature source Temporal, resp. rate (!) 21, height '5\' 5"'$  (1.651 m), weight 81.7 kg, SpO2 99 %. Temp:  [97.7 F (36.5 C)-98.6 F (37 C)] 97.7 F (36.5 C) (12/24 1015) Pulse Rate:  [82-87] 87 (12/24 1027) Resp:  [11-21] 21 (12/24 1027) BP: (115-147)/(36-60) 121/42 (12/24 1027) SpO2:  [95 %-100 %] 99 % (12/24 1027) Weight:  [81.7 kg] 81.7 kg (12/23 1820)  Physical Exam: General: Alert and awake, oriented x3, not in any acute distress. HEENT: anicteric sclera,  EOMI CVS regular rate, normal  Chest: , no wheezing, no respiratory distress Abdomen: soft non-distended,  Extremities: no edema or deformity noted bilaterally Buttocks covered Neuro: nonfocal  CBC:    BMET Recent Labs    01/03/19 0208 01/04/19 1209  NA 129* 130*  K 3.1*  3.3*  CL 89* 94*  CO2 26 24  GLUCOSE 150* 75  BUN 52* 55*  CREATININE 7.96* 8.01*  CALCIUM 8.5* 8.5*     Liver Panel  Recent Labs    01/03/19 0208 01/04/19 1209  ALBUMIN 1.4* 1.5*       Sedimentation Rate No results for input(s): ESRSEDRATE in the last 72 hours. C-Reactive Protein No results for input(s): CRP in the last 72 hours.  Micro Results: Recent Results (from the past 720 hour(s))  SARS CORONAVIRUS 2 (TAT 6-24 HRS) Nasopharyngeal Nasopharyngeal Swab     Status: None   Collection Time: 12/12/18  1:12 PM   Specimen: Nasopharyngeal Swab  Result Value Ref Range Status   SARS Coronavirus 2 NEGATIVE NEGATIVE Final    Comment: (NOTE) SARS-CoV-2 target nucleic acids are NOT DETECTED. The SARS-CoV-2 RNA is generally detectable in upper and lower respiratory specimens during the acute phase of infection. Negative results do not preclude SARS-CoV-2 infection, do not rule out co-infections with other pathogens, and should not be used as the sole basis for treatment or other patient management decisions. Negative results must be combined with clinical observations, patient history, and epidemiological information. The expected result is Negative. Fact Sheet for Patients: SugarRoll.be Fact Sheet for Healthcare Providers: https://www.woods-mathews.com/ This test is not yet approved or cleared by the [place] FDA and  has been authorized for detection and/or diagnosis of SARS-CoV-2 by FDA under an Emergency Use Authorization (EUA). This EUA will remain  in effect (meaning this test can be used) for the duration of the COVID-19 declaration under  Section 56 4(b)(1) of the Act, 21 U.S.C. section 360bbb-3(b)(1), unless the authorization is terminated or revoked sooner. Performed at [facility], [place] [address], [place], [place] [address]   Culture, blood (routine x 2)     Status: None   Collection Time: 12/25/18 12:54 PM   Specimen: BLOOD  Result Value Ref Range Status   Specimen Description BLOOD RIGHT ANTECUBITAL  Final   Special Requests   Final    BOTTLES DRAWN AEROBIC AND ANAEROBIC Blood Culture adequate volume   Culture   Final    NO GROWTH 5 DAYS Performed at [facility], [place] [address], [place], [place] [address]    Report Status 12/30/2018 FINAL  Final  SARS CORONAVIRUS 2 (TAT 6-24 HRS) Nasopharyngeal Nasopharyngeal Swab     Status: None   Collection Time: 12/25/18  5:24 PM   Specimen: Nasopharyngeal Swab  Result Value Ref Range Status   SARS Coronavirus 2 NEGATIVE NEGATIVE Final    Comment: (NOTE) SARS-CoV-2 target nucleic acids are NOT DETECTED. The SARS-CoV-2 RNA is generally detectable in upper and lower respiratory specimens during the acute phase of infection. Negative results do not preclude SARS-CoV-2 infection, do not rule out co-infections with other pathogens, and should not be used as the sole basis for treatment or other patient management decisions. Negative results must be combined with clinical observations, patient history, and epidemiological information. The expected result is Negative. Fact Sheet for Patients: SugarRoll.be Fact Sheet for Healthcare Providers: https://www.woods-mathews.com/ This test is not yet approved or cleared by the [place] FDA and  has been authorized for detection and/or diagnosis of SARS-CoV-2 by FDA under an Emergency Use Authorization (EUA). This EUA will remain  in effect (meaning this test can be used) for the duration of the COVID-19 declaration under Section 56 4(b)(1) of the Act, 21 U.S.C. section  360bbb-3(b)(1), unless the authorization is terminated or revoked  sooner. Performed at [facility], [place] [address], [place], [place] [address]   Culture, blood (Routine X 2) w Reflex to ID Panel     Status: None   Collection Time: 12/26/18  6:47 AM   Specimen: BLOOD RIGHT HAND  Result Value Ref Range Status   Specimen Description BLOOD RIGHT HAND  Final   Special Requests   Final    BOTTLES DRAWN AEROBIC ONLY Blood Culture results may not be optimal due to an inadequate volume of blood received in culture bottles   Culture   Final    NO GROWTH 5 DAYS Performed at [facility], [place] [address] [place], [place] [address]    Report Status 12/31/2018 FINAL  Final  Body fluid culture     Status: None   Collection Time: 12/26/18  3:03 PM   Specimen: Peritoneal Dialysis; Body Fluid  Result Value Ref Range Status   Specimen Description PERITONEAL DIALYSIS  Final   Special Requests NONE  Final   Gram Stain   Final    RARE WBC PRESENT, PREDOMINANTLY PMN NO ORGANISMS SEEN    Culture   Final    NO GROWTH 3 DAYS Performed at [facility] [address], [place], [place] [address]    Report Status 12/30/2018 FINAL  Final  Culture, fungus without smear     Status: None (Preliminary result)   Collection Time: 01/01/19  4:49 PM   Specimen: PATH Other; Abscess  Result Value Ref Range Status   Specimen Description ABSCESS SACRAL  Final   Special Requests NONE  Final   Culture   Final    NO FUNGUS ISOLATED AFTER 3 DAYS Performed at [facility], [address], [place], [place] [address]    Report Status PENDING  Incomplete  Aerobic/Anaerobic Culture (surgical/deep wound)     Status: None (Preliminary result)   Collection Time: 01/01/19  4:49 PM   Specimen: PATH Other; Abscess  Result Value Ref Range Status   Specimen Description ABSCESS SACRAL  Final   Special Requests NONE  Final   Gram Stain   Final    RARE WBC PRESENT, PREDOMINANTLY PMN NO ORGANISMS SEEN     Culture   Final    NO GROWTH 3 DAYS NO ANAEROBES ISOLATED; CULTURE IN PROGRESS FOR 5 DAYS Performed at [facility] [address], [place], [place] [address]    Report Status PENDING  Incomplete    Studies/Results: No results found.    Assessment/Plan:  INTERVAL HISTORY: Flex sig is normal   Principal Problem:   Abscess of sacrum (HCC) Active Problems:   Uncontrolled type 2 diabetes mellitus with peripheral neuropathy (HCC)   Essential hypertension, benign   CAD S/P mLAD PCI with DES   Presence of drug coated stent in LAD coronary artery   ESRD on dialysis ([place])   Symptomatic anemia   Gluteal abscess   Peritonitis ([place])    Susan Horton is a 48 y.o. female with  ESRD on PD with fall, gluteal abscess, peritonitis  By cell count and prominent leukocytosis. Peritonitis by labs resolved, Clinically never much evidence for symptoms of this. Still with gluteal pain. Flex sig normal  #1 Gluteal abscess:   I would complete 2 weeks of zyvox which is being given orally  #2 Peritonitis: resolved  #3 Leukocytosis: Next step would be Heme/Onc consult and bone marrow biopsy. The latter is not going to happen on Xmas day or the  weekend.  I would favor DC to home and further workup as an outpatient  I will sign off for now. Please call with further questions.    LOS: 9 days   Alcide Evener 01/04/2019, 1:55 PM

## 2019-01-05 LAB — GLUCOSE, CAPILLARY
Glucose-Capillary: 89 mg/dL (ref 70–99)
Glucose-Capillary: 73 mg/dL (ref 70–99)

## 2019-01-05 LAB — RENAL FUNCTION PANEL
Albumin: 1.3 g/dL — ABNORMAL LOW (ref 3.5–5.0)
Anion gap: 15 (ref 5–15)
BUN: 54 mg/dL — ABNORMAL HIGH (ref 6–20)
CO2: 24 mmol/L (ref 22–32)
Calcium: 8.6 mg/dL — ABNORMAL LOW (ref 8.9–10.3)
Chloride: 94 mmol/L — ABNORMAL LOW (ref 98–111)
Creatinine, Ser: 7.99 mg/dL — ABNORMAL HIGH (ref 0.44–1.00)
GFR calc Af Amer: 6 mL/min — ABNORMAL LOW (ref >60)
GFR calc non Af Amer: 5 mL/min — ABNORMAL LOW (ref >60)
Glucose, Bld: 108 mg/dL — ABNORMAL HIGH (ref 70–99)
Phosphorus: 6.8 mg/dL — ABNORMAL HIGH (ref 2.5–4.6)
Potassium: 3.5 mmol/L (ref 3.5–5.1)
Sodium: 133 mmol/L — ABNORMAL LOW (ref 135–145)

## 2019-01-05 LAB — CBC
HCT: 24 % — ABNORMAL LOW (ref 36.0–46.0)
Hemoglobin: 7.9 g/dL — ABNORMAL LOW (ref 12.0–15.0)
MCH: 27.2 pg (ref 26.0–34.0)
MCHC: 32.9 g/dL (ref 30.0–36.0)
MCV: 82.8 fL (ref 80.0–100.0)
Platelets: 468 10*3/uL — ABNORMAL HIGH (ref 150–400)
RBC: 2.9 MIL/uL — ABNORMAL LOW (ref 3.87–5.11)
RDW: 14.5 % (ref 11.5–15.5)
WBC: 37.7 10*3/uL — ABNORMAL HIGH (ref 4.0–10.5)
nRBC: 0 % (ref 0.0–0.2)

## 2019-01-05 LAB — PREPARE RBC (CROSSMATCH)

## 2019-01-05 LAB — HEMOGLOBIN AND HEMATOCRIT, BLOOD
HCT: 29 % — ABNORMAL LOW (ref 36.0–46.0)
Hemoglobin: 9.7 g/dL — ABNORMAL LOW (ref 12.0–15.0)

## 2019-01-05 MED ORDER — SODIUM CHLORIDE 0.9% IV SOLUTION: Freq: Once | INTRAVENOUS | Status: AC

## 2019-01-05 MED ORDER — OXYCODONE HCL 10 MG PO TABS: 5.0000 mg | ORAL_TABLET | Freq: Four times a day (QID) | ORAL | 0 refills | Status: DC | PRN

## 2019-01-05 MED ORDER — POLYETHYLENE GLYCOL 3350 17 G PO PACK: 17.0000 g | PACK | Freq: Two times a day (BID) | ORAL | 0 refills | Status: DC

## 2019-01-05 MED ORDER — TOUJEO SOLOSTAR 300 UNIT/ML ~~LOC~~ SOPN: 10.0000 [IU] | PEN_INJECTOR | Freq: Two times a day (BID) | SUBCUTANEOUS | 3 refills | Status: DC

## 2019-01-05 MED ORDER — SENNOSIDES-DOCUSATE SODIUM 8.6-50 MG PO TABS: 1.0000 | ORAL_TABLET | Freq: Two times a day (BID) | ORAL | 0 refills | Status: DC

## 2019-01-05 MED ORDER — LINEZOLID 600 MG PO TABS: 600.0000 mg | ORAL_TABLET | Freq: Two times a day (BID) | ORAL | 0 refills | Status: DC

## 2019-01-05 NOTE — TOC Progression Note (Signed)
Transition of Care [facility]) - Progression Note    Patient Details  Name: Susan Horton MRN: [id number] Date of Birth: [birth year]  Transition of Care [facility] CM/SW Contact  Maryclare Labrador, RN Phone Number: 01/05/2019, 9:25 AM  Clinical Narrative:   CM discussed [place] recommendation with pt  - pt declined.  Pt informed CM that he sister comes to her home daily and they walk so she wants to continue to exercize with pt.           Expected Discharge Plan and Services                                                 Social Determinants of Health (SDOH) Interventions    Readmission Risk Interventions No flowsheet data found.

## 2019-01-05 NOTE — Progress Notes (Signed)
Pt hgb 7.9 ordered 1 unit of PRBC verified by another nurse, will continue to monitor s/s of blood transfusion adverse reaction.

## 2019-01-05 NOTE — Plan of Care (Signed)
Pt for discharge today going home, peritoneal dialysis done this am, alert and oriented, ambulatory, discontinued peripheral IV line, wound site dry and intact, room air, no complain of pain at this time, her sister at the bedside, given health teachings, next appointment, due med explained and understood, given all her personal belongings.

## 2019-01-05 NOTE — Progress Notes (Signed)
Family Medicine Teaching Service Daily Progress Note Intern Pager: [phone]  Patient name: Susan Horton Medical record number: [id number] Date of birth: [birth year] Age: 48 y.o. Gender: female  Primary Care Provider: Charlott Rakes, MD Consultants: Surgery, nephrology, IR, ID Code Status: Full  Pt Overview and Major Events to Date:  12/25/18 admitted to [facility], aztreonam, vanc and metronidazole started 12/26/18 Vanc discontinued 12/27/18 Linezolid started 12/28/18 aztreonam discontinued, unasyn started 12/29/18 metronidazole and unasyn discontinued 01/01/19 aspiration of perirectal abscess 01/04/19: sigmoidoscopy   Assessment and Plan: Brithney L Jonesis a 48 y.o.femalepresenting with sacral phlegmon. PMH is significant forESRD on home PD, T2DM, HTN, CHF,PVD.  Sacral phlegmon-stable, no change WBC improving 49.4>41.6>40.9>37.7  S/p aspiration by IR on 12/21, NG X 2 days. Sigmoidoscopy 12/24: internal and external hemorrhoids, otherwise neg -Continue abx: linezolid (12/18-) -ID following, appreciate recommendations, see above -General surgery following appreciate recommendations -Will consult GI today given continued symptoms -Pain control: scheduled tylenol, scheduled oxy IR q6h 10mg  with 5 mg q3h prn -Vitals per routine -D/c home today following transfusion   Leukocytosis  thrombocytosis , 12/24 labs pending Likely secondary to ongoing infection however source still under investigation and remains unclear . Pt remains afebrile overnight.   WBC 49.4>41.6>40.9>37.7, slowly improving Pt continues on linezolid as above.  Aspiration of pericecal fluid on 12/21.  Gram stain negative.  We will check pending continue to monitor. -Consider heme consult given anemia with leukocytosis and mild thrombocytosis  Anemia 12/24 labs pending Unknown etiology at this time but likely 2/2 on going infection.  Hgb 7.9  -Transfuse 1 unit RBC today, post H&H -Transfusion threshold 8 given CAD.   -Continue to monitor on CBC. Monitor for signs of bleeding -  ESRD on home PD Home medications include PhosLo, calcitriol. Missed PD yesterday in hopes of getting MRI.   -Nephrology consulted, appreciate recommendations  Hypokalemia K 3.5 today, stable  -Monitor  BMP  Constipation, resolved with bowel prep Continue to monitor  CAD s/p stent placement Follows with [facility].Patient has history of CAD and is on aspirin, Plavix and metoprolol. Patient had drug-eluting stent placed in 2018.  -Restart home aspirin and Plavix -Continue home metoprolol  PAD Medications include Plavix, aspirin, lovastatin 20 mg daily -Holding home Plavix and ASA -Pravastatin 20 mg due to it being formulary in the hospital.  HFpEF Echo from 2019 showed EF of 55 to 60% with G2DD.Home medications as metoprolol XL -Monitor signs and symptoms of heart failure -Continue home metoprolol XL dose  T2 DMwith peripheral neuropathy Patient's home medications include Toujeo 25 units twice dailyand humulin SSI. CBG 89 today  -Holding home Toujeo -Sensitive sliding scale - 8 units Lantus daily -Continue home Lyrica and amitriptyline -CBGs with meals  Gastroparesis Patient has history of gastroparesis for which she takes metoclopramide 5 mg at home. -Continue home metoclopramide  HTN Patient's home medications include Toprol-XL 50 mg daily -Continue home medications  Asthma Home medications include SingulairPRNat bedtime, Advair twice daily and Ventolin as needed. -Continue home asthma medications -Holding Singulair    FEN/GI: Renal/carb modified PPx: SCDs  Disposition: Medically stable for discharge after transfusion and if H&H normal.   Subjective:  Patient says she is tired and exhausted and has on going pain in buttock region. Looking forward to going home today.  Objective: Temp:  [97.7 F (36.5 C)-99 F (37.2 C)] 98.7 F (37.1 C) (12/25 0538) Pulse Rate:   [84-95] 90 (12/25 0538) Resp:  [11-21] 16 (12/25 0538) BP: (120-147)/(36-76) 138/47 (12/25  0538) SpO2:  [95 %-100 %] 96 % (12/25 0538) Weight:  [80 kg-81.2 kg] 80 kg (12/25 0538)  General: Alert and cooperative and appears to be in no acute distress Cardio: Normal S1 and S2, RRR. No murmurs or rubs.   Pulm:  CTAB, Normal respiratory effort Abdomen: Bowel sounds normal. Abdomen soft and non-tender.  GU: Bilateral gluteal swelling, tender and firm on palpation. No significant erythema.  Extremities: No peripheral edema. Warm/ well perfused.  Strong radial pulse Neuro: Cranial nerves grossly intact  Laboratory: Recent Labs  Lab 01/03/19 0208 01/04/19 1907 01/05/19 0454  WBC 40.9* 41.3* 37.7*  HGB 8.6* 8.4* 7.9*  HCT 26.2* 25.4* 24.0*  PLT 500* 475* 468*   Recent Labs  Lab 01/03/19 0208 01/04/19 1209 01/05/19 0454  NA 129* 130* 133*  K 3.1* 3.3* 3.5  CL 89* 94* 94*  CO2 26 24 24   BUN 52* 55* 54*  CREATININE 7.96* 8.01* 7.99*  CALCIUM 8.5* 8.5* 8.6*  GLUCOSE 150* 75 108*    Imaging/Diagnostic Tests:  Lattie Haw, MD 01/05/2019, 7:44 AM PGY-1, [place] Intern pager: [phone], text pages welcome

## 2019-01-05 NOTE — Progress Notes (Signed)
1 Day Post-Op   Subjective/Chief Complaint: PT with no acute changes GI findings noted.   Objective: Vital signs in last 24 hours: Temp:  [97.7 F (36.5 C)-99 F (37.2 C)] 98.1 F (36.7 C) (12/25 0738) Pulse Rate:  [84-96] 96 (12/25 0738) Resp:  [11-21] 18 (12/25 0738) BP: (120-147)/(36-76) 142/64 (12/25 0738) SpO2:  [95 %-100 %] 96 % (12/25 0738) Weight:  [80 kg-81.2 kg] 80 kg (12/25 0738) Last BM Date: 01/04/19  Intake/Output from previous day: 12/24 0701 - 12/25 0700 In: 332.2 [P.O.:120; I.V.:212.2] Out: 0  Intake/Output this shift: No intake/output data recorded.  Constitutional: No acute distress, conversant, appears states age. Eyes: Anicteric sclerae, moist conjunctiva, no lid lag Lungs: Clear to auscultation bilaterally, normal respiratory effort CV: regular rate and rhythm, no murmurs, no peripheral edema, pedal pulses 2+ GI: Soft, no masses or hepatosplenomegaly, non-tender to palpation Skin: No rashes, palpation reveals normal turgor Psychiatric: appropriate judgment and insight, oriented to person, place, and time   Lab Results:  Recent Labs    01/04/19 1907 01/05/19 0454  WBC 41.3* 37.7*  HGB 8.4* 7.9*  HCT 25.4* 24.0*  PLT 475* 468*   BMET Recent Labs    01/04/19 1209 01/05/19 0454  NA 130* 133*  K 3.3* 3.5  CL 94* 94*  CO2 24 24  GLUCOSE 75 108*  BUN 55* 54*  CREATININE 8.01* 7.99*  CALCIUM 8.5* 8.6*   PT/INR No results for input(s): LABPROT, INR in the last 72 hours. ABG No results for input(s): PHART, HCO3 in the last 72 hours.  Invalid input(s): PCO2, PO2  Studies/Results: No results found.  Anti-infectives: Anti-infectives (From admission, onward)   Start     Dose/Rate Route Frequency Ordered Stop   01/02/19 2200  linezolid (ZYVOX) tablet 600 mg     600 mg Oral Every 12 hours 01/02/19 1431 01/11/19 2359   01/01/19 1800  fluconazole (DIFLUCAN) IVPB 200 mg  Status:  Discontinued     200 mg 100 mL/hr over 60 Minutes  Intravenous Every 24 hours 01/01/19 0820 01/01/19 1204   12/31/18 1500  fluconazole (DIFLUCAN) IVPB 400 mg     400 mg 100 mL/hr over 120 Minutes Intravenous NOW 12/31/18 1413 12/31/18 1715   12/29/18 1400  linezolid (ZYVOX) IVPB 600 mg  Status:  Discontinued     600 mg 300 mL/hr over 60 Minutes Intravenous Every 12 hours 12/29/18 1312 01/02/19 1431   12/28/18 1800  ampicillin-sulbactam (UNASYN) 1.5 g in sodium chloride 0.9 % 100 mL IVPB  Status:  Discontinued     1.5 g 200 mL/hr over 30 Minutes Intravenous Every 12 hours 12/28/18 1429 12/29/18 1311   12/28/18 1230  amoxicillin (AMOXIL) capsule 500 mg     500 mg Oral  Once 12/28/18 1218 12/28/18 1253   12/28/18 1115  penicillin G in sodium chloride 10,000 units/mL syringe for skin test - 1st bleb     1,000 Units Intradermal  Once 12/28/18 1034 12/28/18 1130   12/28/18 1115  penicillin G in sodium chloride 10,000 units/mL syringe for skin test - 2nd bleb     1,000 Units Intradermal  Once 12/28/18 1034 12/28/18 1130   12/28/18 1100  penicillin G in sodium chloride 10,000 units/mL syringe for skin test     1,000 Units Topical  Once 12/28/18 1034 12/28/18 1130   12/28/18 1030  penicillin G in sodium chloride 10,000 units/mL syringe for skin test  Status:  Discontinued     1,000 Units Topical  Once 12/28/18  1018 12/28/18 1034   12/28/18 1030  penicillin G in sodium chloride 10,000 units/mL syringe for skin test - 1st bleb  Status:  Discontinued     1,000 Units Intradermal  Once 12/28/18 1018 12/28/18 1034   12/28/18 1030  penicillin G in sodium chloride 10,000 units/mL syringe for skin test - 2nd bleb  Status:  Discontinued     1,000 Units Intradermal  Once 12/28/18 1018 12/28/18 1034   12/26/18 0130  aztreonam (AZACTAM) 0.5 g in dextrose 5 % 50 mL IVPB  Status:  Discontinued     0.5 g 100 mL/hr over 30 Minutes Intravenous Every 8 hours 12/25/18 1858 12/28/18 1429   12/26/18 0000  vancomycin variable dose per unstable renal function (pharmacist  dosing)  Status:  Discontinued      Does not apply See admin instructions 12/25/18 1634 12/28/18 1139   12/25/18 2200  aztreonam (AZACTAM) 0.5 g in dextrose 5 % 50 mL IVPB  Status:  Discontinued     0.5 g 100 mL/hr over 30 Minutes Intravenous Every 12 hours 12/25/18 1634 12/25/18 1646   12/25/18 1900  metroNIDAZOLE (FLAGYL) IVPB 500 mg  Status:  Discontinued     500 mg 100 mL/hr over 60 Minutes Intravenous Every 8 hours 12/25/18 1858 12/29/18 1327   12/25/18 1700  vancomycin (VANCOCIN) 2,000 mg in sodium chloride 0.9 % 500 mL IVPB  Status:  Discontinued     2,000 mg 250 mL/hr over 120 Minutes Intravenous  Once 12/25/18 1648 12/25/18 2030   12/25/18 1646  aztreonam (AZACTAM) 0.5 g in dextrose 5 % 50 mL IVPB  Status:  Discontinued     0.5 g 100 mL/hr over 30 Minutes Intravenous Every 8 hours 12/25/18 1646 12/25/18 1851   12/25/18 1645  vancomycin (VANCOCIN) IVPB 1000 mg/200 mL premix  Status:  Discontinued     1,000 mg 200 mL/hr over 60 Minutes Intravenous  Once 12/25/18 1631 12/25/18 1634   12/25/18 1645  aztreonam (AZACTAM) 2 g in sodium chloride 0.9 % 100 mL IVPB  Status:  Discontinued     2 g 200 mL/hr over 30 Minutes Intravenous  Once 12/25/18 1631 12/25/18 1634   12/25/18 1645  vancomycin (VANCOCIN) IVPB 1000 mg/200 mL premix  Status:  Discontinued     1,000 mg 200 mL/hr over 60 Minutes Intravenous  Once 12/25/18 1632 12/25/18 1634   12/25/18 1645  aztreonam (AZACTAM) 1 g in sodium chloride 0.9 % 100 mL IVPB  Status:  Discontinued     1 g 200 mL/hr over 30 Minutes Intravenous  Once 12/25/18 1632 12/25/18 1634   12/25/18 1645  vancomycin (VANCOCIN) 1,500 mg in sodium chloride 0.9 % 500 mL IVPB  Status:  Discontinued     1,500 mg 250 mL/hr over 120 Minutes Intravenous  Once 12/25/18 1634 12/25/18 1648      Assessment/Plan: ESRD on PD CAD s/p mLAD PCI with DES - Plavix on hold PVD HTN DM type II  36F s/p mechanicalground level falls Presacralfluid collection-clinically and  historically this is an acute hematoma. -CT12/14w/2 x 1 x 1 cmphlegmon on CT scan thatmight bean infected hematoma after GLF on 12/9. - CT 12/16 w/enlarging fluid collection in the medial left buttock subcutaneous soft tissues measuring 2.9 x 1.9 cm - s/p bedside aspiration 12/17, aspirate appeared to be old blood - MRI 12/20 showed5.2 x 3.8 x 8.9 cmperisacral and coccygeal fluid collection with complex features. ID requesting re-aspiration and send for gram stain, aerobic and anaerobic culture -IR aspirated  perisacral fluid collection 12/21. Impression was fat necrosis versus infection. Not purulent. Gram stain negative. Culture negative thus far. -Clinically the gluteal cleft process appears to be improving, and I do not think there is any immediate need to take to the OR for incision drainage and debridement. This may be a self-limited process with antibiotics and sitz bath's  -Leukocytosis 40,000+ is unexplained. This does not correlate with the soft tissue process in her gluteal cleft. Her abdomen is also benign and does not suggest active peritonitis.  FEN -Renal diet, NPO after MN VTE -sqHeparin  ID -Aztreonam/Flagyl/Vanc 12/14 >>. Per IDswitched to linezolid.  Plan: -no surgical plans at this time. -please call if needed.  LOS: 10 days    Ralene Ok 01/05/2019

## 2019-01-05 NOTE — Progress Notes (Signed)
Pt 1 unit of PRBC completed no s/s of adverse reaction in blood transfusion.

## 2019-01-05 NOTE — Progress Notes (Signed)
Pt discharged

## 2019-01-05 NOTE — Progress Notes (Signed)
Patient ID: Susan Horton, female   DOB: 03/09/1970, 48 y.o.   MRN: [id number]  [facility] Progress Note   Assessment/ Plan:   1.  Sacral, coccygeal and gluteal abscesses: Status post aspiration of perisacral fluid collection on 12/21 with negative Gram stain and cultures so far.  Surgical note from today, gluteal cleft process appears to be improving without imminent need for surgery.  Leukocytosis improving on current antibiotic therapy with linezolid (narrowed down from aztreonam/vancomycin/Flagyl). 2. ESRD: Continue current peritoneal dialysis prescription, no evidence of PD peritonitis based on exam.  Previous imaging does not show communication of abscess with peritoneum.  If discharged home, she will continue prior CCPD prescription. 3. Anemia: With some downtrending hemoglobin/hematocrit noted on this morning's labs with ongoing PRBC transfusion when seen. 4. CKD-MBD: Calcium level currently at goal with rising phosphorus level, on calcitriol for 5. Nutrition: Continue current diet with ONS/protein supplementation to optimize wound healing/PD losses.  She has chronic gastroparesis on metoclopramide that further compromise his nutritional status. 6. Hypertension: Blood pressure currently acceptable, continue to follow with PD.  Subjective:   Reports to be feeling fair, excited somewhat because the primary service told her that she may get to go home today.   Objective:   BP (!) (P) 141/50   Pulse (P) 94   Temp (P) 98.1 F (36.7 C)   Resp (P) 18   Ht 5\' 5"  (1.651 m)   Wt 80 kg   SpO2 (P) 98%   BMI 29.35 kg/m   Physical Exam: Gen: Comfortably resting in bed, sleeping on her side CVS: Pulse regular rhythm, normal rate Resp: Clear to auscultation, no rales/rhonchi Abd: Soft, mild global distention, nontender Ext: No lower extremity edema  Labs: BMET Recent Labs  Lab 12/31/18 0642 01/01/19 0416 01/02/19 0227 01/02/19 0701 01/03/19 0208 01/04/19 1209  01/05/19 0454  NA 127* 128* 127* 128* 129* 130* 133*  K 3.2* 3.3* 2.9* 2.9* 3.1* 3.3* 3.5  CL 90* 89* 88* 87* 89* 94* 94*  CO2 22 24 22 24 26 24 24   GLUCOSE 110* 98 134* 80 150* 75 108*  BUN 51* 53* 52* 55* 52* 55* 54*  CREATININE 9.10* 8.57* 8.27* 8.58* 7.96* 8.01* 7.99*  CALCIUM 8.5* 8.3* 8.2* 8.6* 8.5* 8.5* 8.6*  PHOS  --  6.1*  --   --  5.8* 6.4* 6.8*   CBC Recent Labs  Lab 01/02/19 0701 01/03/19 0208 01/04/19 1907 01/05/19 0454  WBC 41.6* 40.9* 41.3* 37.7*  HGB 8.6* 8.6* 8.4* 7.9*  HCT 26.8* 26.2* 25.4* 24.0*  MCV 84.0 82.9 83.3 82.8  PLT 493* 500* 475* 468*      Medications:    . sodium chloride   Intravenous Once  . sodium chloride   Intravenous Once  . acetaminophen  650 mg Oral Q6H  . amitriptyline  10 mg Oral QHS  . calcitRIOL  1.5 mcg Oral Daily  . darbepoetin (ARANESP) injection - DIALYSIS  100 mcg Subcutaneous Q Wed-1800  . gentamicin cream  1 application Topical Daily  . insulin aspart  0-6 Units Subcutaneous TID WC  . insulin glargine  8 Units Subcutaneous QHS  . linezolid  600 mg Oral Q12H  . metoCLOPramide  5 mg Oral TID AC & HS  . metoprolol succinate  50 mg Oral Daily  . mometasone-formoterol  2 puff Inhalation BID  . oxyCODONE  10 mg Oral Q6H  . pantoprazole  40 mg Oral Daily  . polyethylene glycol  17 g Oral Daily  .  pravastatin  20 mg Oral q1800  . pregabalin  75 mg Oral Daily  . senna-docusate  1 tablet Oral Daily   Elmarie Shiley, MD 01/05/2019, 10:25 AM

## 2019-01-06 LAB — TYPE AND SCREEN
ABO/RH(D): O POS
Antibody Screen: NEGATIVE
Unit division: 0

## 2019-01-06 LAB — BPAM RBC
Blood Product Expiration Date: 202101172359
ISSUE DATE / TIME: 202012250952
Unit Type and Rh: 5100

## 2019-01-06 LAB — AEROBIC/ANAEROBIC CULTURE W GRAM STAIN (SURGICAL/DEEP WOUND): Culture: NO GROWTH

## 2019-01-08 ENCOUNTER — Encounter: Payer: Self-pay | Admitting: Family Medicine

## 2019-01-08 ENCOUNTER — Telehealth: Payer: Self-pay | Admitting: Family Medicine

## 2019-01-08 DIAGNOSIS — M4628 Osteomyelitis of vertebra, sacral and sacrococcygeal region: Secondary | ICD-10-CM

## 2019-01-08 DIAGNOSIS — D72829 Elevated white blood cell count, unspecified: Secondary | ICD-10-CM

## 2019-01-08 NOTE — Telephone Encounter (Signed)
Patient d/c'ed on 12/25.  Ref placed for ID and Hematology.  Will call patient today to advise of this and ensure follow up.

## 2019-01-08 NOTE — Telephone Encounter (Signed)
Called patient and advised that she needs to follow up with ID and Hematology.  Orders placed for both.  She was supposed to follow up with her PCP and called this AM, but they weren't open yet.  She is planning to call again in the next few minutes.  She reports that she has had two bloody BMs since returning from the hospital.  She reports that her pain is well-controlled from her sacral abscess.  She denies chest pain or shortness of breath, but describes feeling "run down."  Discussed sigmoidoscopy results with her and advised that bleeding could be coming from her hemorrhoids, but warrants concern given that she required a transfusion in the hospital.  Advised that given her fatigue and bloody BMs, that she come to ED for further evaluation and possible blood transfusion.  She reports that she will "think about it" and will call her regular doctor again for an appointment and for further advice.  She reported that she has been taking her linezolid without problem.  She is aware that she needs to follow up with Hematology and ID.  She was given the number for Dr. Derek Mound office to call and schedule and appointment.    She stated that she understood everything that happened during her hospitalization and was able to repeat back everything that occurred.  She was again advised to go to the ED for further evaluation given hx significant anemia and recent transfusion in the setting of bloody BMs with fatigue.  She voiced understanding again.  [place] Constable, D.O.  PGY-2 Family Medicine  01/08/2019 9:51 AM

## 2019-01-08 NOTE — Progress Notes (Signed)
Letter sent for follow up of hospitalization

## 2019-01-09 ENCOUNTER — Inpatient Hospital Stay (HOSPITAL_COMMUNITY)
Admission: EM | Admit: 2019-01-09 | Discharge: 2019-01-14 | DRG: 579 | Disposition: A | Discharge status: Home Health | Payer: Medicare Other | Attending: Family Medicine | Admitting: Family Medicine

## 2019-01-09 ENCOUNTER — Telehealth: Payer: Self-pay | Admitting: *Deleted

## 2019-01-09 DIAGNOSIS — Z90710 Acquired absence of both cervix and uterus: Secondary | ICD-10-CM

## 2019-01-09 DIAGNOSIS — I5032 Chronic diastolic (congestive) heart failure: Secondary | ICD-10-CM | POA: Diagnosis not present

## 2019-01-09 DIAGNOSIS — Z7982 Long term (current) use of aspirin: Secondary | ICD-10-CM

## 2019-01-09 DIAGNOSIS — I679 Cerebrovascular disease, unspecified: Secondary | ICD-10-CM

## 2019-01-09 DIAGNOSIS — Z8249 Family history of ischemic heart disease and other diseases of the circulatory system: Secondary | ICD-10-CM

## 2019-01-09 DIAGNOSIS — L0231 Cutaneous abscess of buttock: Secondary | ICD-10-CM | POA: Diagnosis not present

## 2019-01-09 DIAGNOSIS — Z5329 Procedure and treatment not carried out because of patient's decision for other reasons: Secondary | ICD-10-CM | POA: Diagnosis not present

## 2019-01-09 DIAGNOSIS — Z20828 Contact with and (suspected) exposure to other viral communicable diseases: Secondary | ICD-10-CM | POA: Diagnosis not present

## 2019-01-09 DIAGNOSIS — K219 Gastro-esophageal reflux disease without esophagitis: Secondary | ICD-10-CM | POA: Diagnosis present

## 2019-01-09 DIAGNOSIS — E1122 Type 2 diabetes mellitus with diabetic chronic kidney disease: Secondary | ICD-10-CM | POA: Diagnosis not present

## 2019-01-09 DIAGNOSIS — Z794 Long term (current) use of insulin: Secondary | ICD-10-CM

## 2019-01-09 DIAGNOSIS — J4540 Moderate persistent asthma, uncomplicated: Secondary | ICD-10-CM | POA: Diagnosis present

## 2019-01-09 DIAGNOSIS — K3184 Gastroparesis: Secondary | ICD-10-CM | POA: Diagnosis present

## 2019-01-09 DIAGNOSIS — Z20822 Contact with and (suspected) exposure to covid-19: Secondary | ICD-10-CM | POA: Diagnosis not present

## 2019-01-09 DIAGNOSIS — Z86718 Personal history of other venous thrombosis and embolism: Secondary | ICD-10-CM

## 2019-01-09 DIAGNOSIS — M4628 Osteomyelitis of vertebra, sacral and sacrococcygeal region: Secondary | ICD-10-CM

## 2019-01-09 DIAGNOSIS — E669 Obesity, unspecified: Secondary | ICD-10-CM | POA: Diagnosis present

## 2019-01-09 DIAGNOSIS — Z888 Allergy status to other drugs, medicaments and biological substances status: Secondary | ICD-10-CM

## 2019-01-09 DIAGNOSIS — E785 Hyperlipidemia, unspecified: Secondary | ICD-10-CM | POA: Diagnosis present

## 2019-01-09 DIAGNOSIS — A1801 Tuberculosis of spine: Secondary | ICD-10-CM | POA: Diagnosis not present

## 2019-01-09 DIAGNOSIS — D631 Anemia in chronic kidney disease: Secondary | ICD-10-CM | POA: Diagnosis present

## 2019-01-09 DIAGNOSIS — Z955 Presence of coronary angioplasty implant and graft: Secondary | ICD-10-CM

## 2019-01-09 DIAGNOSIS — Z992 Dependence on renal dialysis: Secondary | ICD-10-CM

## 2019-01-09 DIAGNOSIS — N186 End stage renal disease: Secondary | ICD-10-CM | POA: Diagnosis not present

## 2019-01-09 DIAGNOSIS — R531 Weakness: Secondary | ICD-10-CM

## 2019-01-09 DIAGNOSIS — Z9049 Acquired absence of other specified parts of digestive tract: Secondary | ICD-10-CM

## 2019-01-09 DIAGNOSIS — Z87891 Personal history of nicotine dependence: Secondary | ICD-10-CM

## 2019-01-09 DIAGNOSIS — Z7902 Long term (current) use of antithrombotics/antiplatelets: Secondary | ICD-10-CM

## 2019-01-09 DIAGNOSIS — Z6828 Body mass index (BMI) 28.0-28.9, adult: Secondary | ICD-10-CM

## 2019-01-09 DIAGNOSIS — K644 Residual hemorrhoidal skin tags: Secondary | ICD-10-CM | POA: Diagnosis present

## 2019-01-09 DIAGNOSIS — I132 Hypertensive heart and chronic kidney disease with heart failure and with stage 5 chronic kidney disease, or end stage renal disease: Secondary | ICD-10-CM | POA: Diagnosis not present

## 2019-01-09 DIAGNOSIS — Z79899 Other long term (current) drug therapy: Secondary | ICD-10-CM

## 2019-01-09 DIAGNOSIS — E1151 Type 2 diabetes mellitus with diabetic peripheral angiopathy without gangrene: Secondary | ICD-10-CM | POA: Diagnosis present

## 2019-01-09 DIAGNOSIS — G252 Other specified forms of tremor: Secondary | ICD-10-CM | POA: Diagnosis not present

## 2019-01-09 DIAGNOSIS — I2510 Atherosclerotic heart disease of native coronary artery without angina pectoris: Secondary | ICD-10-CM | POA: Diagnosis present

## 2019-01-09 DIAGNOSIS — E1142 Type 2 diabetes mellitus with diabetic polyneuropathy: Secondary | ICD-10-CM | POA: Diagnosis present

## 2019-01-09 DIAGNOSIS — E1143 Type 2 diabetes mellitus with diabetic autonomic (poly)neuropathy: Secondary | ICD-10-CM | POA: Diagnosis present

## 2019-01-09 LAB — COMPREHENSIVE METABOLIC PANEL
ALT: 20 U/L (ref 0–44)
AST: 20 U/L (ref 15–41)
Albumin: 1.7 g/dL — ABNORMAL LOW (ref 3.5–5.0)
Alkaline Phosphatase: 349 U/L — ABNORMAL HIGH (ref 38–126)
Anion gap: 19 — ABNORMAL HIGH (ref 5–15)
BUN: 93 mg/dL — ABNORMAL HIGH (ref 6–20)
CO2: 24 mmol/L (ref 22–32)
Calcium: 9.1 mg/dL (ref 8.9–10.3)
Chloride: 92 mmol/L — ABNORMAL LOW (ref 98–111)
Creatinine, Ser: 11.48 mg/dL — ABNORMAL HIGH (ref 0.44–1.00)
GFR calc Af Amer: 4 mL/min — ABNORMAL LOW (ref >60)
GFR calc non Af Amer: 3 mL/min — ABNORMAL LOW (ref >60)
Glucose, Bld: 93 mg/dL (ref 70–99)
Potassium: 4.2 mmol/L (ref 3.5–5.1)
Sodium: 135 mmol/L (ref 135–145)
Total Bilirubin: 0.5 mg/dL (ref 0.3–1.2)
Total Protein: 6.7 g/dL (ref 6.5–8.1)

## 2019-01-09 LAB — CBC WITH DIFFERENTIAL/PLATELET
Abs Immature Granulocytes: 0.32 10*3/uL — ABNORMAL HIGH (ref 0.00–0.07)
Basophils Absolute: 0.1 10*3/uL (ref 0.0–0.1)
Basophils Relative: 0 %
Eosinophils Absolute: 0.1 10*3/uL (ref 0.0–0.5)
Eosinophils Relative: 0 %
HCT: 26.2 % — ABNORMAL LOW (ref 36.0–46.0)
Hemoglobin: 8.2 g/dL — ABNORMAL LOW (ref 12.0–15.0)
Immature Granulocytes: 1 %
Lymphocytes Relative: 6 %
Lymphs Abs: 1.8 10*3/uL (ref 0.7–4.0)
MCH: 27.5 pg (ref 26.0–34.0)
MCHC: 31.3 g/dL (ref 30.0–36.0)
MCV: 87.9 fL (ref 80.0–100.0)
Monocytes Absolute: 1.3 10*3/uL — ABNORMAL HIGH (ref 0.1–1.0)
Monocytes Relative: 4 %
Neutro Abs: 26.2 10*3/uL — ABNORMAL HIGH (ref 1.7–7.7)
Neutrophils Relative %: 89 %
Platelets: 510 10*3/uL — ABNORMAL HIGH (ref 150–400)
RBC: 2.98 MIL/uL — ABNORMAL LOW (ref 3.87–5.11)
RDW: 15.1 % (ref 11.5–15.5)
WBC: 29.6 10*3/uL — ABNORMAL HIGH (ref 4.0–10.5)
nRBC: 0.1 % (ref 0.0–0.2)

## 2019-01-09 LAB — PROTIME-INR
INR: 1.1 (ref 0.8–1.2)
Prothrombin Time: 14.4 seconds (ref 11.4–15.2)

## 2019-01-09 LAB — TYPE AND SCREEN
ABO/RH(D): O POS
Antibody Screen: NEGATIVE

## 2019-01-09 NOTE — Telephone Encounter (Signed)
Ok thanks Michelle! 

## 2019-01-09 NOTE — ED Triage Notes (Signed)
Pt back from home today with c/o pain to her buttocks today pt was d/c christmas day for same complaints , pt was told to come back to day due to having blood in her stool

## 2019-01-09 NOTE — Telephone Encounter (Signed)
Patient called to let Dr Tommy Medal know that she is going back to [facility], as her pain and bloody stools have increased.  She has been taking zyvox since discharge last week. Landis Gandy, RN

## 2019-01-10 ENCOUNTER — Other Ambulatory Visit: Payer: Self-pay

## 2019-01-10 ENCOUNTER — Emergency Department (HOSPITAL_COMMUNITY): Payer: Medicare Other

## 2019-01-10 LAB — LACTIC ACID, PLASMA: Lactic Acid, Venous: 1.1 mmol/L (ref 0.5–1.9)

## 2019-01-10 LAB — CBG MONITORING, ED
Glucose-Capillary: 91 mg/dL (ref 70–99)
Glucose-Capillary: 102 mg/dL — ABNORMAL HIGH (ref 70–99)

## 2019-01-10 LAB — GLUCOSE, CAPILLARY: Glucose-Capillary: 83 mg/dL (ref 70–99)

## 2019-01-10 LAB — SARS CORONAVIRUS 2 (TAT 6-24 HRS): SARS Coronavirus 2: NEGATIVE

## 2019-01-10 MED ORDER — METOCLOPRAMIDE HCL 5 MG/ML IJ SOLN
10.0000 mg | Freq: Once | INTRAMUSCULAR | Status: AC
  Administered 2019-01-10: 16:00:00 10 mg via INTRAVENOUS
  Filled 2019-01-10: qty 2

## 2019-01-10 MED ORDER — POLYMYXIN B-TRIMETHOPRIM 10000-0.1 UNIT/ML-% OP SOLN: 1.0000 [drp] | OPHTHALMIC | Status: DC

## 2019-01-10 MED ORDER — METHOCARBAMOL 500 MG PO TABS
500.0000 mg | ORAL_TABLET | Freq: Two times a day (BID) | ORAL | Status: DC | PRN
  Administered 2019-01-12 – 2019-01-14 (×2): 500 mg via ORAL
  Filled 2019-01-10 (×2): qty 1

## 2019-01-10 MED ORDER — UMECLIDINIUM BROMIDE 62.5 MCG/INH IN AEPB
1.0000 | INHALATION_SPRAY | Freq: Every day | RESPIRATORY_TRACT | Status: DC
  Filled 2019-01-10: qty 7

## 2019-01-10 MED ORDER — OXYCODONE HCL 5 MG PO TABS
5.0000 mg | ORAL_TABLET | Freq: Four times a day (QID) | ORAL | Status: DC | PRN
  Administered 2019-01-11 – 2019-01-12 (×4): 5 mg via ORAL
  Filled 2019-01-10 (×3): qty 1

## 2019-01-10 MED ORDER — PANTOPRAZOLE SODIUM 40 MG PO TBEC
40.0000 mg | DELAYED_RELEASE_TABLET | Freq: Every day | ORAL | Status: DC
  Administered 2019-01-10 – 2019-01-14 (×5): 40 mg via ORAL
  Filled 2019-01-10 (×5): qty 1

## 2019-01-10 MED ORDER — AMITRIPTYLINE HCL 10 MG PO TABS
10.0000 mg | ORAL_TABLET | Freq: Every day | ORAL | Status: DC
  Filled 2019-01-10 (×5): qty 1

## 2019-01-10 MED ORDER — FLUTICASONE FUROATE-VILANTEROL 100-25 MCG/INH IN AEPB
1.0000 | INHALATION_SPRAY | Freq: Every day | RESPIRATORY_TRACT | Status: DC
  Filled 2019-01-10: qty 28

## 2019-01-10 MED ORDER — GENTAMICIN SULFATE 0.1 % EX CREA
1.0000 "application " | TOPICAL_CREAM | Freq: Every day | CUTANEOUS | Status: DC
  Administered 2019-01-10 – 2019-01-11 (×3): 1 via TOPICAL
  Filled 2019-01-10: qty 15

## 2019-01-10 MED ORDER — ACETAMINOPHEN 650 MG RE SUPP
650.0000 mg | Freq: Four times a day (QID) | RECTAL | Status: DC
  Filled 2019-01-10: qty 1

## 2019-01-10 MED ORDER — METOPROLOL SUCCINATE ER 50 MG PO TB24
50.0000 mg | ORAL_TABLET | Freq: Every day | ORAL | Status: DC
  Administered 2019-01-10 – 2019-01-14 (×5): 50 mg via ORAL
  Filled 2019-01-10 (×3): qty 1
  Filled 2019-01-10: qty 2
  Filled 2019-01-10: qty 1

## 2019-01-10 MED ORDER — FLUTICASONE FUROATE-VILANTEROL 200-25 MCG/INH IN AEPB: 1.0000 | INHALATION_SPRAY | Freq: Every day | RESPIRATORY_TRACT | Status: DC

## 2019-01-10 MED ORDER — ALBUTEROL SULFATE (2.5 MG/3ML) 0.083% IN NEBU: 2.5000 mg | INHALATION_SOLUTION | Freq: Four times a day (QID) | RESPIRATORY_TRACT | Status: DC | PRN

## 2019-01-10 MED ORDER — SODIUM CHLORIDE 0.9 % IV SOLN: INTRAVENOUS | Status: AC

## 2019-01-10 MED ORDER — ISOSORBIDE MONONITRATE ER 30 MG PO TB24
15.0000 mg | ORAL_TABLET | Freq: Every day | ORAL | Status: DC
  Administered 2019-01-11 – 2019-01-14 (×4): 15 mg via ORAL
  Filled 2019-01-10 (×4): qty 1

## 2019-01-10 MED ORDER — PRAVASTATIN SODIUM 40 MG PO TABS
40.0000 mg | ORAL_TABLET | Freq: Every day | ORAL | Status: DC
  Administered 2019-01-11 – 2019-01-13 (×3): 40 mg via ORAL
  Filled 2019-01-10 (×3): qty 1

## 2019-01-10 MED ORDER — MONTELUKAST SODIUM 10 MG PO TABS
10.0000 mg | ORAL_TABLET | Freq: Every day | ORAL | Status: DC
  Filled 2019-01-10 (×4): qty 1

## 2019-01-10 MED ORDER — HYDROMORPHONE HCL 1 MG/ML IJ SOLN
1.0000 mg | INTRAMUSCULAR | Status: DC | PRN
  Administered 2019-01-10 – 2019-01-11 (×2): 1 mg via INTRAVENOUS
  Filled 2019-01-10 (×2): qty 1

## 2019-01-10 MED ORDER — NITROGLYCERIN 0.4 MG SL SUBL: 0.4000 mg | SUBLINGUAL_TABLET | SUBLINGUAL | Status: DC | PRN

## 2019-01-10 MED ORDER — HYDROMORPHONE HCL 1 MG/ML IJ SOLN: 0.5000 mg | INTRAMUSCULAR | Status: DC | PRN

## 2019-01-10 MED ORDER — FERROUS SULFATE 325 (65 FE) MG PO TABS
325.0000 mg | ORAL_TABLET | Freq: Every day | ORAL | Status: DC
  Administered 2019-01-10 – 2019-01-14 (×5): 325 mg via ORAL
  Filled 2019-01-10 (×5): qty 1

## 2019-01-10 MED ORDER — CALCIUM ACETATE (PHOS BINDER) 667 MG PO CAPS
2001.0000 mg | ORAL_CAPSULE | Freq: Three times a day (TID) | ORAL | Status: DC
  Administered 2019-01-11 – 2019-01-12 (×3): 2001 mg via ORAL
  Filled 2019-01-10 (×4): qty 3

## 2019-01-10 MED ORDER — CALCIUM ACETATE (PHOS BINDER) 667 MG PO CAPS
1334.0000 mg | ORAL_CAPSULE | ORAL | Status: DC
  Administered 2019-01-11: 1334 mg via ORAL
  Filled 2019-01-10 (×2): qty 2

## 2019-01-10 MED ORDER — ADULT MULTIVITAMIN W/MINERALS CH
1.0000 | ORAL_TABLET | Freq: Every day | ORAL | Status: DC
  Administered 2019-01-10 – 2019-01-14 (×5): 1 via ORAL
  Filled 2019-01-10 (×5): qty 1

## 2019-01-10 MED ORDER — METOCLOPRAMIDE HCL 5 MG PO TABS
5.0000 mg | ORAL_TABLET | Freq: Three times a day (TID) | ORAL | Status: DC
  Administered 2019-01-10 – 2019-01-14 (×14): 5 mg via ORAL
  Filled 2019-01-10 (×15): qty 1

## 2019-01-10 MED ORDER — SENNOSIDES-DOCUSATE SODIUM 8.6-50 MG PO TABS
1.0000 | ORAL_TABLET | Freq: Two times a day (BID) | ORAL | Status: DC
  Administered 2019-01-11 – 2019-01-13 (×2): 1 via ORAL
  Filled 2019-01-10 (×9): qty 1

## 2019-01-10 MED ORDER — PREGABALIN 75 MG PO CAPS
75.0000 mg | ORAL_CAPSULE | Freq: Every day | ORAL | Status: DC
  Administered 2019-01-12: 75 mg via ORAL
  Filled 2019-01-10: qty 1
  Filled 2019-01-10: qty 3
  Filled 2019-01-10 (×3): qty 1

## 2019-01-10 MED ORDER — FENTANYL CITRATE (PF) 100 MCG/2ML IJ SOLN
100.0000 ug | Freq: Once | INTRAMUSCULAR | Status: AC
  Administered 2019-01-10: 100 ug via INTRAVENOUS
  Filled 2019-01-10: qty 2

## 2019-01-10 MED ORDER — LIDOCAINE-PRILOCAINE 2.5-2.5 % EX CREA
1.0000 "application " | TOPICAL_CREAM | CUTANEOUS | Status: DC
  Filled 2019-01-10: qty 5

## 2019-01-10 MED ORDER — MUPIROCIN CALCIUM 2 % EX CREA
1.0000 "application " | TOPICAL_CREAM | Freq: Two times a day (BID) | CUTANEOUS | Status: DC | PRN
  Filled 2019-01-10: qty 15

## 2019-01-10 MED ORDER — CALCITRIOL 0.5 MCG PO CAPS
1.5000 ug | ORAL_CAPSULE | Freq: Every day | ORAL | Status: DC
  Administered 2019-01-10 – 2019-01-14 (×5): 1.5 ug via ORAL
  Filled 2019-01-10 (×5): qty 3

## 2019-01-10 MED ORDER — INSULIN ASPART 100 UNIT/ML ~~LOC~~ SOLN
0.0000 [IU] | SUBCUTANEOUS | Status: DC
  Administered 2019-01-11: 3 [IU] via SUBCUTANEOUS
  Administered 2019-01-11: 1 [IU] via SUBCUTANEOUS
  Administered 2019-01-12: 5 [IU] via SUBCUTANEOUS
  Administered 2019-01-12 (×2): 3 [IU] via SUBCUTANEOUS

## 2019-01-10 MED ORDER — CLOPIDOGREL BISULFATE 75 MG PO TABS: 75.0000 mg | ORAL_TABLET | Freq: Every day | ORAL | Status: DC

## 2019-01-10 MED ORDER — ASPIRIN 81 MG PO CHEW: 81.0000 mg | CHEWABLE_TABLET | Freq: Every day | ORAL | Status: DC

## 2019-01-10 MED ORDER — POLYETHYLENE GLYCOL 3350 17 G PO PACK
17.0000 g | PACK | Freq: Two times a day (BID) | ORAL | Status: DC
  Administered 2019-01-10 – 2019-01-12 (×2): 17 g via ORAL
  Filled 2019-01-10 (×8): qty 1

## 2019-01-10 MED ORDER — HYDROMORPHONE HCL 1 MG/ML IJ SOLN
1.0000 mg | Freq: Once | INTRAMUSCULAR | Status: AC
  Administered 2019-01-10: 1 mg via INTRAVENOUS
  Filled 2019-01-10: qty 1

## 2019-01-10 MED ORDER — ACETAMINOPHEN 325 MG PO TABS
650.0000 mg | ORAL_TABLET | Freq: Four times a day (QID) | ORAL | Status: DC
  Administered 2019-01-10 – 2019-01-14 (×13): 650 mg via ORAL
  Filled 2019-01-10 (×13): qty 2

## 2019-01-10 MED ORDER — LINEZOLID 600 MG PO TABS
600.0000 mg | ORAL_TABLET | Freq: Two times a day (BID) | ORAL | Status: DC
  Filled 2019-01-10: qty 1

## 2019-01-10 NOTE — ED Notes (Signed)
Admitting MD at bedside.

## 2019-01-10 NOTE — ED Provider Notes (Signed)
Patient care assumed at 1500. Please see original H&P from this Emergency Department encounter for full History and Physical.  Briefly, this is a 48 y.o. female with history of ESRD on peritoneal dialysis nightly, anemia, recent hospital admission for sacral phlegmon on 12/25 presents to the ER for evaluation of increased tail bone pain and swelling over the last 2 days. She presented to the ER on 12/14 for tailbone pain. She was admitted for treatment of sacral phlegmon that was treated with IV antibiotics, IR drainage. MRI showed possible osteomyelitis or traumatic changes from recent fall. GI became involved and did a sigmoidoscopy because patient was having bloody stools. States her sigmoidoscopy was normal. Since hospital discharge she has been taking oxycodone 1 tablet every 6 hours with adequate control of her pain however the last 2 days the pain has broken through and has been unbearable. Is constant, severe, radiates to left hip and entire left leg. She cannot walk or sit. She is tearful. Has also noticed increased swelling and firmness in the area of the wound and is worried that it has recollected. Her stools are no longer bloody, still slightly watery and green in color.  Plan per team at sign-out: - Awaiting MRI of her pelvis to evaluate for infectious process  Course of Care: Assumed care of pt. she appears uncomfortable on exam.  She is hemodynamically stable.  She is transported to MRI and had her study completed which demonstrates worsening signs of infection and abscess as well as new pelvic free fluid. Discussed her case with family medicine, they will admit here for further management of worsening infection and pain control. I discussed this plan with the patient and family, they understand and agree with plan for admission.    Labs Reviewed  COMPREHENSIVE METABOLIC PANEL - Abnormal; Notable for the following components:      Result Value   Chloride 92 (*)    BUN 93 (*)    Creatinine, Ser 11.48 (*)    Albumin 1.7 (*)    Alkaline Phosphatase 349 (*)    GFR calc non Af Amer 3 (*)    GFR calc Af Amer 4 (*)    Anion gap 19 (*)    All other components within normal limits  CBC WITH DIFFERENTIAL/PLATELET - Abnormal; Notable for the following components:   WBC 29.6 (*)    RBC 2.98 (*)    Hemoglobin 8.2 (*)    HCT 26.2 (*)    Platelets 510 (*)    Neutro Abs 26.2 (*)    Monocytes Absolute 1.3 (*)    Abs Immature Granulocytes 0.32 (*)    All other components within normal limits  CULTURE, BLOOD (ROUTINE X 2)  CULTURE, BLOOD (ROUTINE X 2)  SARS CORONAVIRUS 2 (TAT 6-24 HRS)  PROTIME-INR  LACTIC ACID, PLASMA  BASIC METABOLIC PANEL  CBC  CBG MONITORING, ED  TYPE AND SCREEN    Labs reviewed by myself and considered in medical decision making.  MR PELVIS WO CONTRAST  Final Result      Imaging reviewed by myself and considered in medical decision making. Imaging final read interpreted by radiology.    Patient care discussed with attending physician who is in agreement with the above plan. Attending has seen and examined the patient. See note cosigner for attending name.    Era Bumpers, MD 01/10/19 Pauline Aus    Ezequiel Essex, MD 01/11/19 [phone]

## 2019-01-10 NOTE — ED Notes (Signed)
Pt to MRI at this time.

## 2019-01-10 NOTE — H&P (Addendum)
[facility] Admission History and Physical Service Pager: [phone]  Patient name: Susan Horton Medical record number: [id number] Date of birth: 1970/06/23 Age: 48 y.o. Gender: female  Primary Care Provider: Charlott Rakes, MD Consultants: nephrology, ID, gen surgery  Code Status: Full Preferred Emergency Contact: Ranee Gosselin (sister) [phone]  Chief Complaint: pain in buttock   Assessment and Plan: Susan Horton is a 47 y.o. female presenting with pain in her buttock . PMH is significant for ESRD on home PD, T2DM, HTN, CHF, PVD  Buttock pain 2/2 sacral abscess with h/o recent sacral phlegmon Patient with recent admission from 12/14-12/25 sacral phlegmon.  During that admission patient received imaging including CT abdomen pelvis showing stranding and phlegmon is changes in the superior gluteal cleft and ischio anal fossa extending to the tip of the sacrum.  Several specialists were involved including general surgery, infectious disease, and GI.  Per infectious disease patient was started on linezolid and continue this on discharge.  General surgery was able to provide an aspiration of an old hematoma.  MRI was obtained showing perisacral and coccygeal fluid collection and so IR was consulted for ultrasound-guided aspiration which showed yellow fluid.  GI was also consulted for seeding giving proximity of possible abscess.  GI performed a flex sigmoidoscopy which showed internal and external hemorrhoids but was otherwise negative.  Patient was advised to follow-up with infectious disease on discharge. Patient reports continued pain that has worsened since last discharge. MRI showing increase in size of abscess along posterior superficial margin now measuring 150 cubic cm.  Patient with stable vital signs, no fever, HR 107 (likely 2/2 pain), BP 152/76, and satting well on room air. Does not appear septic and LA wnl.  Given that patient is not able to have pain  control as an outpatient we will plan to admit for IV pain management as well as linezolid.  Unclear why patient is having more pain if she was on appropriate antibiotics.  Can consider that she was unable to tolerate p.o. due to gastroparesis which may have caused her to not take all her antibiotics as prescribed. Given worsening abscess will plan to re-consult gen surg and ID. Gen surgery will see patient. Per ID (Dr. Tommy Medal) patient should not continue abx, will need gen surg consult, and ID will see in AM.  -admit to med surg, attending Dr. Erin Hearing -follow blood cultures -will hold linezolid (12/18-) per ID recs.  -ID consulted, appreciate recommendations. Will see in AM  -surgery consulted, appreciate recommendations. Will plan to see patient   -scheduled tylenol q6h -IV dilaudid '1mg'$  q4h PRN and 0.'5mg'$  q4PRN  -gentle fluids of NS '@50cc'$ /hr given h/o ESRD  -vitals per routine   Leukocytosis WBC 29.6, improved from 37.7. Patient with history of leukocytosis leading back to last admission.  During last admission infectious disease recommended bone marrow biopsy and referral to hematology.  Patient has not yet followed up with hematology. -ensure outpatient f/u with hematology   Anemia Hgb 8.2 on admission. At time of discharge on 12/25 was 9.7.  -continue to monitor   ESRD on home PD Uses home nightly peritoneal dialysis.  Home medications include PhosLo, calcitriol. -Nephrology consulted, appreciate recommendations -Continue home medications  CAD Follows with [facility].  Home meds include aspirin, Plavix, metoprolol.  Patient also with history of drug-eluting stent placed in 2018.  -hold ASA and plavix for possible procedure  PAD History of significant peripheral artery disease and status  post right SFA intervention by Dr. Carlis Abbott on 212/3/20.  Home meds include Plavix, aspirin, lovastatin. -hold asa and plavix for possible procedure   HFpEF Also addressed by  cardiology.  Recent echo on 12/11 showing EF 60 to 65% with normal left ventricular function.  Echo from 2019 showing EF 55 to 60% with G2DD.  Her medications include metoprolol & IMDUR -continue home meds   T2DM with peripheral neuropathy   Recent A1c of 7.5 on 12/26/18.  Home meds include Toujeo 10U bid units as well as Humalog sliding scale insulin. -sSSI -monitor CBGs q4h  -continue amitriptyline   Gastroparesis  H/o gastroparesis with frequent vomiting. Home meds: reglan '5mg'$  tid  -continue home meds  HTN Hypertensive on admission, 152/76. Likely component of pain. Home meds: toprol '50mg'$  daily  -continue home meds   Asthma Meds: Singulair as needed, Advair twice daily and Ventolin as needed. -continue home meds   FEN/GI: NPO   Prophylaxis: SCDs   Disposition: admit to med surg, attending Dr. Erin Hearing   History of Present Illness:  Susan Horton is a 48 y.o. female presenting with worsening buttock pain   Patient reports she was recently discharged on christmas but on 12/26 her pain became work. Patient also reports having to strain more for BMs and notes blood in stool. Did not have pain but had bright red blood in the toilet. Reports feeling like her LLE has been having worsening pain as well, almost like spasms. Both legs hurt but left leg is worse. Is not able to stand or move due to pain. Did have leg pain during last admission as well. Reports decreased appetite 2/2 pain and having to lay on her right side. Symptoms initially started around 12/14. Does report compliance with linezolid, however does vomit daily. Patient is worried because she said her cousin had similar symptoms when she was diagnosed with MS.   In ED MRI was obtained.  Given that patient had poor pain control it was decided to admit for observation and further intervention if MRI was showing worsening abscess.  Review Of Systems: Per HPI with the following additions:   Review of Systems  Constitutional:  Positive for malaise/fatigue. Negative for chills and fever.  Respiratory: Negative for cough and shortness of breath.        "gets winded"  Cardiovascular: Negative for chest pain.  Gastrointestinal: Positive for blood in stool.  Genitourinary: Negative for dysuria, frequency and hematuria.    Patient Active Problem List   Diagnosis Date Noted  . Abscess of sacrum ([place])   . Peritonitis ([place])   . Gluteal abscess 12/25/2018  . Symptomatic anemia 09/16/2018  . Peripheral vascular disease, unspecified ([place]) 09/04/2018  . ESRD on dialysis ([place]) 10/11/2017  . Chronic diastolic CHF (congestive heart failure) ([place]) 06/18/2017  . Moderate persistent asthma 06/10/2017  . CKD (chronic kidney disease) stage 4, GFR 15-29 ml/min (HCC)   . CAD S/P mLAD PCI with DES 08/06/2016  . Presence of drug coated stent in LAD coronary artery 08/06/2016  . Carotid bruit present 06/15/2016  . Dyslipidemia 06/15/2016  . Smoker 06/15/2016  . Neuropathy 03/10/2016  . Essential hypertension, benign 12/11/2015  . Gastroparesis   . Uncontrolled type 2 diabetes mellitus with peripheral neuropathy ([place]) 06/05/2014    Past Medical History: Past Medical History:  Diagnosis Date  . Abnormal stress test   . Anemia   . Angio-edema   . Asthma   . CAD (coronary artery disease)    DES to  mid LAD July 2018, residual moderate RCA disease  . Cataract   . CKD (chronic kidney disease) stage 4, GFR 15-29 ml/min (HCC)    Dialysis T/Th/Sa  . DVT (deep venous thrombosis) ([place])    1996 during pregnancy, 2015 left leg  . Eczema   . Essential hypertension 12/11/2015  . Gastroparesis   . GERD (gastroesophageal reflux disease)   . Gluteal abscess 12/27/2018  . Hidradenitis   . Migraine   . Neuropathy   . Peripheral vascular disease ([place])    blood clot in leg  . Progressive angina ([place]) 06/05/2014   Chest pain  . Type 2 diabetes mellitus ([place])   . Type II diabetes mellitus ([place])   . Urticaria     Past Surgical  History: Past Surgical History:  Procedure Laterality Date  . A/V FISTULAGRAM N/A 06/22/2017   Procedure: A/V FISTULAGRAM - left arm;  Surgeon: [name], MD;  Location: [place] [facility];  Service: Cardiovascular;  Laterality: N/A;  . ABDOMINAL AORTOGRAM W/LOWER EXTREMITY Bilateral 12/14/2018   Procedure: ABDOMINAL AORTOGRAM W/LOWER EXTREMITY;  Surgeon: [name], MD;  Location: [place] [facility];  Service: Cardiovascular;  Laterality: Bilateral;  . ABDOMINOPLASTY    . ADENOIDECTOMY    . APPENDECTOMY  1995  . AV FISTULA PLACEMENT Left 02/01/2017   Procedure: ARTERIOVENOUS BRACHIOCEPHALIC (AV) FISTULA CREATION;  Surgeon: [name], MD;  Location: [place];  Service: Vascular;  Laterality: Left;  . CHOLECYSTECTOMY  1993  . CORONARY ANGIOPLASTY WITH STENT PLACEMENT    . CORONARY STENT INTERVENTION N/A 08/05/2016   Procedure: Coronary Stent Intervention;  Surgeon: [name], MD;  Location: [place];  Service: Cardiovascular;  Laterality: N/A;  . EXPLORATORY LAPAROTOMY  08/14/2005   lysis of adhesions, drainage of tubo-ovarian abscess  . FISTULA SUPERFICIALIZATION Left 09/14/2017   Procedure: FISTULA SUPERFICIALIZATION ARTERIOVENOUS FISTULA LEFT ARM;  Surgeon: [name], MD;  Location: [place];  Service: Vascular;  Laterality: Left;  . FLEXIBLE SIGMOIDOSCOPY N/A 01/04/2019   Procedure: FLEXIBLE SIGMOIDOSCOPY;  Surgeon: Clarene Essex, MD;  Location: [facility];  Service: Endoscopy;  Laterality: N/A;  . HYDRADENITIS EXCISION  02/08/2011   Procedure: EXCISION HYDRADENITIS GROIN;  Surgeon: [name], MD;  Location: [place] [facility];  Service: General;  Laterality: N/A;  Excisioin of Hidradenitis Left groin  . INGUINAL HIDRADENITIS EXCISION  07/06/2010   bilateral  . INSERTION OF DIALYSIS CATHETER N/A 09/14/2017   Procedure: INSERTION OF TUNNELED DIALYSIS CATHETER Right Internal Jugular;  Surgeon: [name], MD;   Location: [facility];  Service: Vascular;  Laterality: N/A;  . IR US GUIDE BX ASP/DRAIN  01/01/2019  . LEFT HEART CATH AND CORONARY ANGIOGRAPHY N/A 08/05/2016   Procedure: Left Heart Cath and Coronary Angiography;  Surgeon: [name], MD;  Location: [facility];  Service: Cardiovascular;  Laterality: N/A;  . PERIPHERAL VASCULAR INTERVENTION Left 12/14/2018   Procedure: PERIPHERAL VASCULAR INTERVENTION;  Surgeon: [name], MD;  Location: [facility];  Service: Cardiovascular;  Laterality: Left;  SFA  . REDUCTION MAMMAPLASTY  2002  . TONSILLECTOMY    . TUBAL LIGATION  1996  . VAGINAL HYSTERECTOMY  08/04/2005   and cysto    Social History: Social History   Tobacco Use  . Smoking status: Former Smoker    Years: 20.00    Types: Cigarettes    Quit date: 11/2016    Years since quitting: 2.1  . Smokeless tobacco: Never  Used  Substance Use Topics  . Alcohol use: No    Alcohol/week: 0.0 standard drinks  . Drug use: No   Additional social history: quit smoking cigarettes, smokes marijuana for appetite stimulation   Please also refer to relevant sections of EMR.  Family History: Family History  Problem Relation Age of Onset  . Allergic rhinitis Mother   . Hypertension Father   . Allergic rhinitis Father   . Hypertension Sister   . Allergic rhinitis Sister   . Hypertension Brother   . Allergic rhinitis Brother   . Breast cancer Cousin        x2    Allergies and Medications: Allergies  Allergen Reactions  . Repatha [Evolocumab] Itching  . Lisinopril Cough   No current facility-administered medications on file prior to encounter.   Current Outpatient Medications on File Prior to Encounter  Medication Sig Dispense Refill  . albuterol (PROVENTIL HFA;VENTOLIN HFA) 108 (90 BASE) MCG/ACT inhaler Inhale 1-2 puffs into the lungs every 6 (six) hours as needed for wheezing or shortness of breath.     [name] amitriptyline (ELAVIL) 10 MG tablet Take 1 tablet (10 mg  total) by mouth at bedtime. 30 tablet 1  . aspirin 81 MG chewable tablet Chew 1 tablet (81 mg total) by mouth daily. 30 tablet 0  . B Complex-C-Zn-Folic Acid (DIALYVITE 503 WITH ZINC) 0.8 MG TABS Take 1 tablet by mouth daily.    . calcitRIOL (ROCALTROL) 0.5 MCG capsule Take 1.5 mcg by mouth daily.    . calcium acetate (PHOSLO) 667 MG capsule Take 1,334-2,001 mg by mouth See admin instructions. Take three capsules (2001 mg) by mouth up to three times daily with meals and two capsules (1334 mg) with snacks  10  . clopidogrel (PLAVIX) 75 MG tablet Take 1 tablet (75 mg total) by mouth daily with breakfast. 90 tablet 3  . Ferrous Sulfate (IRON) 325 (65 Fe) MG TABS Take 325 mg by mouth daily. 30 each 3  . Fluticasone-Salmeterol (ADVAIR) 250-50 MCG/DOSE AEPB Inhale 1 puff into the lungs 2 (two) times daily. (Patient taking differently: Inhale 1 puff into the lungs daily as needed (shortness of breath). ) 60 each 3  . gentamicin cream (GARAMYCIN) 0.1 % Apply 1 application topically See admin instructions. Apply to access site nightly    . Insulin Glargine, 1 Unit Dial, (TOUJEO SOLOSTAR) 300 UNIT/ML SOPN Inject 10 Units into the skin 2 (two) times daily. 12 pen 3  . insulin regular (HUMULIN R) 100 units/mL injection Inject 0.2-0.25 mLs (20-25 Units total) into the skin See admin instructions. (Patient taking differently: Inject 7-15 Units into the skin 3 (three) times daily before meals. Per CBG) 45 mL 3  . isosorbide mononitrate (IMDUR) 30 MG 24 hr tablet Take 0.5 tablets (15 mg total) by mouth daily. 45 tablet 3  . lidocaine-prilocaine (EMLA) cream Apply 1 application topically See admin instructions. Apply small amount to access site (AVF) 30 minutes before hemodialysis, cover with occlusive dressing (Saran Wrap) if going out to dialysis center  5  . linezolid (ZYVOX) 600 MG tablet Take 1 tablet (600 mg total) by mouth 2 (two) times daily for 14 days. 28 tablet 0  . lovastatin (MEVACOR) 20 MG tablet Take 20  mg by mouth at bedtime.     . methocarbamol (ROBAXIN) 500 MG tablet Take 1 tablet (500 mg total) by mouth 2 (two) times daily as needed for muscle spasms. 60 tablet 1  . metoCLOPramide (REGLAN) 5 MG tablet TAKE 1  TABLET BY MOUTH 4 TIMES DAILY BEFORE MEALS AND AT BEDTIME. (Patient taking differently: Take 5 mg by mouth 4 (four) times daily -  before meals and at bedtime. ) 90 tablet 0  . metoprolol succinate (TOPROL-XL) 50 MG 24 hr tablet Take 1 tablet (50 mg total) by mouth daily. 90 tablet 3  . montelukast (SINGULAIR) 10 MG tablet Take 1 tablet (10 mg total) by mouth at bedtime. (Patient taking differently: Take 10 mg by mouth at bedtime as needed (allergies). ) 30 tablet 5  . mupirocin cream (BACTROBAN) 2 % Apply 1 application topically 2 (two) times daily as needed. To affected areas and cover with non-stick gauze. (Patient taking differently: Apply 1 application topically 2 (two) times daily as needed (for infection. Cover with non-stick gauze.). ) 15 g 0  . nitroGLYCERIN (NITROSTAT) 0.4 MG SL tablet Place 1 tablet (0.4 mg total) under the tongue every 5 (five) minutes as needed for chest pain. 25 tablet 2  . oxyCODONE 10 MG TABS Take 0.5 tablets (5 mg total) by mouth every 6 (six) hours as needed for up to 5 days for severe pain. 1-2 TABLETS EVERY 6 HOURS FOR PAIN (Patient taking differently: Take 5-10 mg by mouth every 6 (six) hours as needed (severe pain). ) 40 tablet 0  . pantoprazole (PROTONIX) 40 MG tablet TAKE 1 TABLET BY MOUTH DAILY. (Patient taking differently: Take 40 mg by mouth daily. ) 30 tablet 7  . polyethylene glycol (MIRALAX / GLYCOLAX) 17 g packet Take 17 g by mouth 2 (two) times daily for 7 days. 14 each 0  . pregabalin (LYRICA) 75 MG capsule Take 1 capsule (75 mg total) by mouth daily. 30 capsule 1  . senna-docusate (SENOKOT-S) 8.6-50 MG tablet Take 1 tablet by mouth 2 (two) times daily for 7 days. 14 tablet 0  . trimethoprim-polymyxin b (POLYTRIM) ophthalmic solution Place 1 drop  into both eyes See admin instructions. Instill one drop into both eyes four times daily for 2 days after each monthly eye injection  12    Objective: BP (!) 162/69 (BP Location: Right Arm)   Pulse (!) 130   Temp 98.2 F (36.8 C) (Rectal)   Resp 16   SpO2 99%  Exam: General: awake and alert, laying in bed  Eyes: PERRL, EOMI  ENTM: somewhat dry mucous membranes  Neck: supple Cardiovascular: regular rhythm, tachycardia, no MRG  Respiratory: CTAB, no wheezes, rales, or rhonchi  Gastrointestinal: soft, non tender, non distended, bowel sounds present  MSK: no edema  Derm: intact, no pustular drainage Back: firm area around sacrum, warm  Neuro: no focal deficit  Psych: normal affect   Labs and Imaging: CBC BMET  Recent Labs  Lab 01/09/19 1922  WBC 29.6*  HGB 8.2*  HCT 26.2*  PLT 510*   Recent Labs  Lab 01/09/19 1922  NA 135  K 4.2  CL 92*  CO2 24  BUN 93*  CREATININE 11.48*  GLUCOSE 93  CALCIUM 9.1      Ref. Range 01/10/2019 10:10  Lactic Acid, Venous Latest Ref Range: 0.5 - 1.9 mmol/L 1.1   CT Abdomen Pelvis Wo Contrast  Addendum Date: 12/27/2018   ADDENDUM REPORT: 12/27/2018 15:53 ADDENDUM: Case was discussed with Dr. Royce Macadamia of nephrology. 12/27/2018 at 3:50 pm Inflammation and phlegmonous change is centered in the posterior gluteal cleft and ischioanal soft tissues with possible extension into the intersphincteric plane, which is in extraperitoneal space. No clearly discernible extension into the peritoneal space is seen on  these images within the limitations of CT soft tissue resolution in the pelvis. Additionally, the increased stranding in both kidneys was discussed as well. This finding is nonspecific and could reflect worsening renal function superimposed urinary tract infection could have a similar appearance and could be excluded with urinalysis if patient is able to produce urine. Electronically Signed   By: Lovena Le M.D.   On: 12/27/2018 15:53   Result  Date: 12/27/2018 CLINICAL DATA:  Lower back pain radiating to buttocks, hard knot in the sacral region. EXAM: CT ABDOMEN AND PELVIS WITHOUT CONTRAST TECHNIQUE: Multidetector CT imaging of the abdomen and pelvis was performed following the standard protocol without IV contrast. Patient unable to tolerate supine scanning, images obtained in a decubitus positioning. COMPARISON:  CT abdomen pelvis 03/17/2017 FINDINGS: Lower chest: Lung bases are clear. Normal heart size. No pericardial effusion. Hepatobiliary: No focal liver abnormality is seen. Patient is post cholecystectomy. Slight prominence of the biliary tree likely related to reservoir effect. No calcified intraductal gallstones. Pancreas: Unremarkable. No pancreatic ductal dilatation or surrounding inflammatory changes. Spleen: Normal in size without focal abnormality. Adrenals/Urinary Tract: Normal adrenal glands. Increasing nonspecific bilateral perinephric stranding. No visible or concerning renal lesions. No urolithiasis or hydronephrosis. Urinary bladder is unremarkable. Stomach/Bowel: Distal esophagus, stomach and duodenal sweep are unremarkable. No small bowel wall thickening or dilatation. No evidence of obstruction. The appendix is surgically absent. No colonic dilatation or wall thickening. Minimal rectal wall thickening. Vascular/Lymphatic: Atherosclerotic plaque within the normal caliber aorta. No suspicious or enlarged lymph nodes in the included lymphatic chains. Reproductive: Uterus is surgically absent. No concerning adnexal lesions. Suspect retained ovarian tissue. Other: There is extensive soft tissue stranding and phlegmonous change in the superior gluteal cleft extending to the tip of the sacrum and towards the mesorectal fat within ill-defined possible fluid collection along the left lateral aspect of this inflammation measuring approximately 2 x 1 x 1 cm in size. Inflammatory change extends to the levator plate with some hazy  intersphincteric stranding which could suggest some transsphincteric extension. No soft tissue gas is seen. No free fluid or air in the abdomen or pelvis. Peritoneal catheter terminates in the left lower quadrant. Musculoskeletal: No features of subjacent osteomyelitis of the sacrum or coccyx. No acute osseous abnormality or suspicious osseous lesion. Mild levocurvature of the spine is likely positional. IMPRESSION: 1. Extensive stranding and phlegmonous change in the superior gluteal cleft and ischioanal fossa extending to the tip of the sacrum and towards the levator plate within ill-defined possible fluid collection along the left lateral aspect of this measuring approximately 2 x 1 x 1 cm in size possibly reflecting developing abscess. Inflammatory change extends to the levator plate with some hazy intersphincteric stranding which could suggest some transsphincteric extension. No soft tissue gas is seen. No features of subjacent osteomyelitis of the sacrum or coccyx. 2. Increasing bilateral nonspecific perinephric stranding. Could be related to patient's decreased renal function though could consider urinalysis to exclude superimposed urinary tract infection. 3. Peritoneal catheter, likely peritoneal dialysis catheter terminating in the left lower quadrant. 4.  Aortic Atherosclerosis (ICD10-I70.0). Electronically Signed: By: Lovena Le M.D. On: 12/25/2018 16:15   DG Sacrum/Coccyx  Result Date: 12/23/2018 CLINICAL DATA:  Pt in with tailbone pain s/p fall 4 days ago. Able to ambulate, states it hurst to sit as there is a knot to her L buttocks EXAM: SACRUM AND COCCYX - 2+ VIEW COMPARISON:  None. FINDINGS: There is no evidence of fracture or other focal bone lesions.  IMPRESSION: Negative. Electronically Signed   By: Lajean Manes M.D.   On: 12/23/2018 17:10   MR PELVIS WO CONTRAST  Result Date: 01/10/2019 CLINICAL DATA:  Presacral abscess drainage January 01, 2019. Pain in the pelvis. History of  perirectal abscess. EXAM: MRI PELVIS WITHOUT CONTRAST TECHNIQUE: Multiplanar multisequence MR imaging of the pelvis was performed. No intravenous contrast was administered. COMPARISON:  12/31/2018 FINDINGS: Nonstandard positioning was employed due to the patient's pain. Urinary Tract:  Urinary bladder unremarkable. Bowel:  No dilated bowel in the pelvis. Vascular/Lymphatic: No pathologic regional adenopathy observed. Reproductive:  Prior hysterectomy. Adnexa unremarkable. Other: Moderate pelvic ascites, new compared to the 12/31/2018 exam. Small amount of ascites along the right paracolic gutter. Musculoskeletal: Extending under the tip of the coccyx but muscle along the posterior superficial margin of the coccyx and lower sacrum, we demonstrate a 7.0 by 4.9 by 8.3 cm (volume = 150 cm^3) abnormal fluid collection compatible with abscess. This has increased in volume compared to previous and likely represents some reaccumulation or reforming of the abscess. This partially extends along the medial margins of the gluteus maximus muscles, which demonstrate adjacent edema compatible with myositis. There is likely some myositis involving the superficial posterior portions of the spinalis thoracis muscles behind the sacrum. There is only a small amount of presacral edema, and no drainable collection anterior to the sacrum at this time. Much of the current abscess is very superficial within the subcutaneous tissues along the posteroinferior margin of the intergluteal cleft. There is subcutaneous edema along the lower anterior abdominal wall, and tracking anteriorly in the right thigh. IMPRESSION: 1. Interval increase in size of the abscess along the posterior superficial margin of the coccyx and lower sacrum, now measuring 150 cubic cm. 2. Moderate pelvic ascites, new compared to 12/20/20202020 exam. 3. There is some myositis involving the superficial posterior portions of the spinalis thoracis muscles behind the sacrum,  and of the medial gluteus maximus muscles adjacent to the abscess. 4. There is some subcutaneous edema along the lower anterior abdominal wall and tracking anteriorly in the right thigh. Electronically Signed   By: Van Clines M.D.   On: 01/10/2019 18:34   MR PELVIS WO CONTRAST  Result Date: 12/31/2018 CLINICAL DATA:  History of abscess about the inner rectal region. EXAM: MRI PELVIS WITHOUT CONTRAST TECHNIQUE: Multiplanar multisequence MR imaging of the pelvis was performed. No intravenous contrast was administered. COMPARISON:  CT study of 12/25/2018 and 12/27/2018 FINDINGS: Urinary Tract: Urinary bladder is moderately distended without signs of hydronephrosis of the distal ureters. Bowel: Visualized bowel is unremarkable aside from some mild thickening of the distal rectum. Assessment is limited on today's study. Vascular/Lymphatic: Is vascular structures not well assessed given lack of intravenous contrast. Appropriate flow voids are present in the bilateral pelvic vasculature. Reproductive:  Post hysterectomy. Other:  Is diffuse body wall edema. Musculoskeletal: Perisacral and coccygeal fluid collection with complex features measuring 5.2 x 3.8 x 8.9 cm. Edema extends to the levator plate as does fluid. Perirectal edema is also noted on today's study. There is no clear evidence of rectal involvement aside from the edema this changes. Stranding also extends to the posterior margin of the puborectalis muscle and the sphincter complex though the fluid collection does not extend to this level. Mild increased T2 signal in the sacrum and coccyx. Signs of low T2 signal throughout visualized bony structures aside from the last sacral segment in the coccyx. Is Assessment overall is limited due to patient position and discomfort with  inability to Ree acquire limited sequences Fluid in stranding extends into the gluteal cleft left greater than right. High signal also involves the inferior margin of the  sphincter complex on the left greater than right. IMPRESSION: 1. Peri sacral and coccygeal fluid collection with stranding extending into the levator plate and sphincter complex. No clear evidence of rectal involvement. Inflammatory changes extend into the left gluteal cleft and also involve the inferior margin of the anal sphincter complex. Anoscopy and or sigmoidoscopy may be helpful for further assessment given above findings. 2. Mild increased T2 signal in the sacrum and coccyx. Given recent history of trauma this is nonspecific, osteomyelitis or posttraumatic changes are considered. Given proximity of fluid osteomyelitis is favored at this time. 3. Diffuse body wall edema. 4. Post hysterectomy. Electronically Signed   By: Zetta Bills M.D.   On: 12/31/2018 09:49   CT ABDOMEN PELVIS W CONTRAST  Result Date: 12/27/2018 CLINICAL DATA:  Anal/rectal abscess EXAM: CT ABDOMEN AND PELVIS WITH CONTRAST TECHNIQUE: Multidetector CT imaging of the abdomen and pelvis was performed using the standard protocol following bolus administration of intravenous contrast. CONTRAST:  120m OMNIPAQUE IOHEXOL 300 MG/ML  SOLN COMPARISON:  12/25/2018 FINDINGS: Lower chest: No acute abnormality Hepatobiliary: No focal liver abnormality is seen. Status post cholecystectomy. No biliary dilatation. Pancreas: No focal abnormality or ductal dilatation. Spleen: No focal abnormality.  Normal size. Adrenals/Urinary Tract: No adrenal abnormality. No focal renal abnormality. No stones or hydronephrosis. Urinary bladder is unremarkable. Stomach/Bowel: Stomach, large and small bowel grossly unremarkable. Vascular/Lymphatic: Aortoiliac atherosclerosis. No aneurysm or adenopathy. Reproductive: Prior hysterectomy.  No adnexal masses. Other: No free fluid or free air. Again noted is inflammation/stranding in the superior gluteal cleft. Enlarging subcutaneous fluid collection noted in the left medial buttock measuring 2.9 x 1.9 cm on image 92 of  series 5. Musculoskeletal: No acute bony abnormality. IMPRESSION: Continued stranding/inflammation in the gluteal subcutaneous soft tissues at the superior gluteal cleft with enlarging fluid collection in the medial left buttock subcutaneous soft tissues measuring 2.9 x 1.9 cm. Aortoiliac atherosclerosis. Prior cholecystectomy and hysterectomy. Electronically Signed   By: KRolm BaptiseM.D.   On: 12/27/2018 17:47   PERIPHERAL VASCULAR CATHETERIZATION  Result Date: 12/14/2018  Patient name: TKHADEEJA ELDENMRN: [id number]       DOB: [birth year]           Sex: female  12/14/2018 Pre-operative Diagnosis: Bilateral lower extremity lifestyle limiting short distance claudication Post-operative diagnosis:  Same Surgeon:  CMarty Heck MD Procedure Performed: 1.  Ultrasound-guided access of the right common femoral artery 2.  Aortogram with bilateral lower extremity arteriogram 3.  Left SFA angioplasty (4 mm x 150 mm Mustang) 4.  Left SFA stent placement (5 mm x 150 mm self-expanding Innova) 5.  49 minutes of monitored moderate conscious sedation time 6.  Mynx closure of the right common femoral artery  Indications: Patient is a 48year old female that was seen in clinic as a referral from nephrology for evaluation of lower extremity arterial disease.  Ultimately her symptoms were consistent with claudication progressing to short distance lifestyle limiting claudication and noninvasive imaging showed evidence of SFA disease with monophasic runoff at the ankles in both lower extremities.  Attempted to recommend conservative management initially but patient stated that her symptoms are progressing.  She presents today for left lower extremity intervention after risk and benefits were discussed.  Findings:  Aortogram showed a fairly small infrarenal aorta that was moderately diseased but no overt flow-limiting  stenosis.  The right common and external iliac are widely patent although small.  The left common iliac is  widely patent and the left external iliac has a proximal 50% stenosis.  I elected not to treat the left iliac given excellent left femoral pulse.  The left lower extremity arteriogram showed a patent common femoral and profunda.  She has a fairly diseased SFA in the mid segment with approximately 150 mm segment with greater than 90 to 95% stenosis.  Patient had a patent above and below-knee popliteal artery.  She has dominant runoff in the left lower extremity via the posterior tibial into the ankle.  The anterior tibial and peroneal were patent proximal but appeared to occlude in the distal calf.  There was reconstitution of a very diminutive dorsalis pedis.  Right lower extremity arteriogram shows a patent common femoral and profunda and again a fairly long diseased segment of SFA with flow-limiting stenosis.  She has a patent above and below-knee popliteal artery.  Dominant runoff in the right lower extremity appears to be the posterior tibial.             Procedure:  The patient was identified in the holding area and taken to room 8.  The patient was then placed supine on the table and prepped and draped in the usual sterile fashion.  A time out was called.  Ultrasound was used to evaluate the right common femoral artery.  It was patent .  A digital ultrasound image was acquired.  A micropuncture needle was used to access the right common femoral artery under ultrasound guidance.  An 018 wire was advanced without resistance and a micropuncture sheath was placed.  The 018 wire was removed and a benson wire was placed.  The micropuncture sheath was exchanged for a 5 french sheath.  An omniflush catheter was advanced over the wire to the level of L-1.  An abdominal angiogram was obtained.  Next the catheter was pulled down and bilateral lower extremity runoff was obtained.  Pertinent findings are noted above.  Given plans to intervene on the left leg first since it was more symptomatic, I used a Glidewire  advantage to cross the aortic bifurcation with a Omni Flush catheter and selected the left SFA.  I then upsized to a long 6 [place] Ansell sheath in the right common femoral artery over the aortic bifurcation and the distal sheath was parked in the left SFA.  Patient was given 100 units/kg heparin.  I then used a Glidewire advantage with a quick cross catheter to cross the SFA lesion into the above-knee popliteal artery.  We did perform hand-injection through the quick cross to confirm we were in the true lumen.  We got additional planning shot from the sheath.  Ultimately selected a 4 mm x 150 mm Mustang to angioplasty the mid SFA lesion throughout its segment.  The balloon was inflated to nominal pressure for 2 minutes.  We shot another arteriogram through the sheath and there was a dissection.  I felt like this had to be stented given the extent of dissection and did not feel like a drug-coated balloon would be acceptable.  I did select a 5 mm x 150 mm self-expanding Innova.  This was placed across the SFA diseased segment where we had angioplastied.  Once the stent was deployed I then used a 4 mm Mustang and overinflated this to 24 mmHg to get full expansion of the stent.  One final injection showed inline flow  down the SFA with no residual stenosis.  There was preserved runoff in the lower extremity with patent trifurcation and dominant runoff into the foot via posterior tibial artery.  That point time wires and catheters were removed and exchanged for a short 6 French sheath in the right groin.  Mynx closure device was deployed and additional pressure was held for 5 minutes.  Patient was taken to PACU in stable condition.     [name], MD [facility] Office: [phone] Pager: [phone]  [name]  IR US Guide Bx Asp/Drain  Result Date: 01/01/2019 INDICATION: 48 year old female with a complex peri sacral and coccygeal fluid collection of  uncertain etiology. EXAM: Ultrasound-guided aspiration MEDICATIONS: The patient is currently admitted to the hospital and receiving intravenous antibiotics. The antibiotics were administered within an appropriate time frame prior to the initiation of the procedure. ANESTHESIA/SEDATION: Fentanyl 100 mcg IV; Versed 2 mg IV Moderate Sedation Time:  17 minutes The patient was continuously monitored during the procedure by the interventional radiology nurse under my direct supervision. COMPLICATIONS: None immediate. PROCEDURE: Informed written consent was obtained from the patient after a thorough discussion of the procedural risks, benefits and alternatives. All questions were addressed. A timeout was performed prior to the initiation of the procedure. The region of interest was interrogated with ultrasound. There is a very ill-defined and extremely heterogeneous complex cystic collection in the superficial subcutaneous tissues of the gluteal cleft extending into the medial aspect of the buttock bilaterally. A suitable skin entry site was selected and marked. The overlying skin was sterilely prepped and draped in standard fashion using chlorhexidine skin prep. Local anesthesia was attained by infiltration with 1% lidocaine. A small dermatotomy was made. Under real-time sonographic guidance, an 18 gauge trocar needle was advanced into the fluid collection. Aspiration was performed while manipulating the needle into all of the visible portions of the collection. The collection is extremely thick and may represents upon a fide adipose tissue. Only a trace amount of opaque potentially purulent fluid was successfully aspirated. The volume is perhaps 1-2 mL. The sample was sent for Gram stain and culture. IMPRESSION: 1. Extremely complex fluid collection in the superficial subcutaneous soft tissues of the gluteal cleft extending in the medial aspect of the buttock bilaterally. 2. Aspiration was minimally successful yielding  only 1-2 mL of opaque tan potentially purulent fluid which was sent for Gram stain and culture. Differential considerations include panniculitis versus a chronic inflammatory process such as fat necrosis. Signed, Criselda Peaches, MD, [facility] Electronically Signed   By: Jacqulynn Cadet M.D.   On: 01/01/2019 19:50   MYOCARDIAL PERFUSION IMAGING  Result Date: 12/21/2018  Nuclear stress EF: 44%. The left ventricular ejection fraction is moderately decreased (30-44%).  There was no ST segment deviation noted during stress.  This is a low risk study. There is no evidence of ischemia or previous myocardial infarction.  The study is normal.    ECHOCARDIOGRAM COMPLETE  Result Date: 12/22/2018   ECHOCARDIOGRAM REPORT   Patient Name:   Susan Horton Date of Exam: 12/22/2018 Medical Rec #:  [id number]     Height:       65.0 in Accession #:    [id number]    Weight:       163.0 lb Date of Birth:  31-Oct-1970      BSA:          1.81 m Patient Age:  48 years      BP:           142/68 mmHg Patient Gender: F             HR:           114 bpm. Exam Location:  [address] Procedure: 2D Echo, Cardiac Doppler and Color Doppler Indications:    R06.02 SOB; I40.9 Acute myocarditis  History:        Patient has prior history of Echocardiogram examinations, most                 recent 06/19/2017. CHF, CAD, Signs/Symptoms:Chest Pain; Risk                 Factors:Hypertension, Diabetes and Dyslipidemia. Chronic kidney                 disease. End stage renal disease. PVD.  Sonographer:    Diamond Nickel RCS Referring Phys: [place]  1. Left ventricular ejection fraction, by visual estimation, is 60 to 65%. The left ventricle has normal function. There is mildly increased left ventricular hypertrophy.  2. The left ventricle has no regional wall motion abnormalities.  3. Global right ventricle has normal systolic function.The right  ventricular size is normal. No increase in right ventricular wall thickness.  4. Left atrial size was normal.  5. Right atrial size was normal.  6. The mitral valve is normal in structure. Trivial mitral valve regurgitation. No evidence of mitral stenosis.  7. The tricuspid valve is normal in structure. Tricuspid valve regurgitation is mild.  8. The aortic valve is tricuspid. Aortic valve regurgitation is trivial. Mild aortic valve sclerosis without stenosis.  9. The pulmonic valve was grossly normal. Pulmonic valve regurgitation is trivial. 10. The inferior vena cava is normal in size with greater than 50% respiratory variability, suggesting right atrial pressure of 3 mmHg. FINDINGS  Left Ventricle: Left ventricular ejection fraction, by visual estimation, is 60 to 65%. The left ventricle has normal function. The left ventricle has no regional wall motion abnormalities. There is mildly increased left ventricular hypertrophy. Normal left atrial pressure. Right Ventricle: The right ventricular size is normal. No increase in right ventricular wall thickness. Global RV systolic function is has normal systolic function. Left Atrium: Left atrial size was normal in size. Right Atrium: Right atrial size was normal in size Pericardium: There is no evidence of pericardial effusion. Mitral Valve: The mitral valve is normal in structure. Trivial mitral valve regurgitation. No evidence of mitral valve stenosis by observation. Tricuspid Valve: The tricuspid valve is normal in structure. Tricuspid valve regurgitation is mild. Aortic Valve: The aortic valve is tricuspid. Aortic valve regurgitation is trivial. Mild aortic valve sclerosis is present, with no evidence of aortic valve stenosis. Pulmonic Valve: The pulmonic valve was grossly normal. Pulmonic valve regurgitation is trivial. Pulmonic regurgitation is trivial. Aorta: The aortic root, ascending aorta and aortic arch are all structurally normal, with no evidence of  dilitation or obstruction. Venous: The inferior vena cava is normal in size with greater than 50% respiratory variability, suggesting right atrial pressure of 3 mmHg. IAS/Shunts: No atrial level shunt detected by color flow Doppler. There is no evidence of a patent foramen ovale. No ventricular septal defect is seen or detected. There is no evidence of an atrial septal defect.  LEFT VENTRICLE PLAX 2D LVIDd:         3.70 cm LVIDs:         2.60  cm LV PW:         1.60 cm LV IVS:        1.30 cm LVOT diam:     2.05 cm LV SV:         34 ml LV SV Index:   18.04 LVOT Area:     3.30 cm  RIGHT VENTRICLE RV S prime:     10.90 cm/s TAPSE (M-mode): 2.4 cm LEFT ATRIUM             Index       RIGHT ATRIUM          Index LA diam:        3.10 cm 1.71 cm/m  RA Area:     9.84 cm LA Vol (A2C):   37.6 ml 20.73 ml/m RA Volume:   20.60 ml 11.36 ml/m LA Vol (A4C):   33.4 ml 18.42 ml/m LA Biplane Vol: 36.8 ml 20.29 ml/m  AORTIC VALVE LVOT Vmax:   107.33 cm/s LVOT Vmean:  64.867 cm/s LVOT VTI:    0.185 m  AORTA Ao Root diam: 3.20 cm  SHUNTS Systemic VTI:  0.18 m Systemic Diam: 2.05 cm  Jenkins Rouge MD Electronically signed by Jenkins Rouge MD Signature Date/Time: 12/22/2018/4:51:37 PM    Final     EKG: 93 bpm, QTC 452, no significant changes since previous   Caroline More, DO 01/10/2019, 7:30 PM PGY-3, [place] [facility] pager: [phone], text pages welcome

## 2019-01-10 NOTE — Discharge Summary (Signed)
[facility] Discharge Summary  Patient name: Susan Horton Medical record number: [id number] Date of birth: 1970/07/16 Age: 48 y.o. Gender: female Date of Admission: 01/09/2019  Date of Discharge: 01/14/19 Admitting Physician: Lurline Del, DO  Primary Care Provider: Charlott Rakes, MD Consultants: ID, gen surg, nephrology   Indication for Hospitalization: sacral abscess   Discharge Diagnoses/Problem List:  Buttock pain 2/2 sacral abscess with h/o recent sacral phlegmon Leukocytosis Anemia ESRD on home PD CAD PAD HFpEF  T2DM with peripheral neuropathy  Gastroparesis HTN Asthma   Disposition: home with HHPT  Discharge Condition: improved  Discharge Exam:  General: lying in bed on R side, in NAD Cardiovascular: RRR, no murmur  Respiratory: CTAB Abdomen: soft, rotund abdomen, +BS, nonTTP. PD site CDI Extremities: warm and well perfused. 1+ pitting edema. Back: surgical dressing in place, no appreciable surrounding erythema or swelling. WUJ:WJXB ROM, strength 5/5 to UE bilaterally, 3/5 LLE and 4/5 RLE strength (question effort),grip strength intact. Neuro:Alert and oriented, speech normal. Romberg negative. Extraocular movements intact. Intact symmetric sensation to light touch of face bilaterally. Some decrease in sensation to LLE (states feels water is running down leg when touched). Hearing grossly intact bilaterally. Tongue protrudes normally with no deviation. Shoulder shrug, smile symmetric.   Brief Hospital Course:  Susan Horton is a 48 y.o. female presenting with pain in her buttock. PMH is significant for ESRD on home PD, T2DM, HTN, CHF, PVD  Sacral abscess Patient presenting with worsening abscess shown on MRI. Recent admission from 12/14-12/25 for sacral phlegmon requiring 2 aspirations by gen surg and IR as well as consult with ID who started linezolid. On discharge patient was compliant on medications but began to have worsening  buttock pain. On arrival to ED MRI was obtained showing increase in size of abscess along the posterior superficial margin of the coccyx and lower sacrum (now measuring 150 cubic cm), moderate pelvic ascites, some myositis involving the superficial posterior portions of the spinalis thoracis muscles behind the sacrum and of the medial gluteus maximus muscles adjacent to the abscess, some subcutaneous edema along the lower anterior abdominal wall and tracking anteriorly in the right thigh. General surgery was consulted given worsening abscess and recommended I&D which occurred on 12/31.  Surgical cultures show no growth to date. Patient did well throughout admission with adequate pain control. Surgery recommended twice daily dressing changes. ID was consulted as well and recommended 14 day course of linezolid and will follow up with ID on 01/26/19.   Persistent Leukocytosis Seen on previous admission as well. WBC on discharge 18.5. Will need to follow up with hematology on discharge for further workup.  Anemia Patient anemic on previous admission and continued this admission. Hgb on discharge 7.7. ESRD likely contributing.   ESRD on PD  Nephrology consulted during admission and patient continued PD.   Tremor and weakness On 1/2 patient presented with resting hand tremor and reported worsening chronic lower extremity weakness, L>R.  Neurological exam notable for distractible bilateral hand tremor and 3/5 LLE weakness though question effort given she was able to stand without difficulty for provider.  Patient was evaluated by both PT and OT who recommended home health.  Patient was recommended for rolling walker with 5 wheels. Brain MRI obtained given patient concern for FH of MS and stroke which showed new areas of T2 and FLAIR signal within the pons and within the cerebral hemispheric white matter.  Differential included early manifestation of small vessel disease versus demyelinating disease  such as  multiple sclerosis.  Neurology was consulted and recommended outpatient follow up for further workup.  Issues for Follow Up:  1. Med changes: 1. Will continue with 14 day course of linezolid to end 1/17. 2. Follow up with outpatient hematology for persistent leukocytosis, will likely need bone marrow bx.  3. Will follow up with Neurology outpatient. Referral placed on discharge. 4. Ensure patient receiving HH PT/OT and nursing to help with dressing changes.  Significant Procedures: surgical I&D 12/31  Significant Labs and Imaging:  Recent Labs  Lab 01/12/19 0520 01/13/19 0612 01/14/19 1142  WBC 16.0* 18.8* 18.5*  HGB 7.6* 8.3* 7.7*  HCT 23.5* 26.8* 25.0*  PLT 441* 455* 454*   Recent Labs  Lab 01/09/19 1922 01/11/19 0552 01/12/19 0520 01/13/19 0612 01/14/19 1142  NA 135 137 136 136 134*  K 4.2 4.4 4.6 4.1 4.5  CL 92* 95* 95* 93* 98  CO2 24 21* 23 24 24   GLUCOSE 93 95 256* 131* 103*  BUN 93* 105* 104* 101* 109*  CREATININE 11.48* 11.85* 10.58* 10.31* 9.98*  CALCIUM 9.1 8.3* 8.3* 8.3* 8.5*  PHOS  --   --  9.5* 7.8* 6.3*  ALKPHOS 349*  --   --   --   --   AST 20  --   --   --   --   ALT 20  --   --   --   --   ALBUMIN 1.7*  --  1.6* 1.8* 1.7*   CT Abdomen Pelvis Wo Contrast  Addendum Date: 12/27/2018   ADDENDUM REPORT: 12/27/2018 15:53 ADDENDUM: Case was discussed with Dr. Royce Macadamia of nephrology. 12/27/2018 at 3:50 pm Inflammation and phlegmonous change is centered in the posterior gluteal cleft and ischioanal soft tissues with possible extension into the intersphincteric plane, which is in extraperitoneal space. No clearly discernible extension into the peritoneal space is seen on these images within the limitations of CT soft tissue resolution in the pelvis. Additionally, the increased stranding in both kidneys was discussed as well. This finding is nonspecific and could reflect worsening renal function superimposed urinary tract infection could have a similar appearance and  could be excluded with urinalysis if patient is able to produce urine. Electronically Signed   By: Lovena Le M.D.   On: 12/27/2018 15:53   Result Date: 12/27/2018 CLINICAL DATA:  Lower back pain radiating to buttocks, hard knot in the sacral region. EXAM: CT ABDOMEN AND PELVIS WITHOUT CONTRAST TECHNIQUE: Multidetector CT imaging of the abdomen and pelvis was performed following the standard protocol without IV contrast. Patient unable to tolerate supine scanning, images obtained in a decubitus positioning. COMPARISON:  CT abdomen pelvis 03/17/2017 FINDINGS: Lower chest: Lung bases are clear. Normal heart size. No pericardial effusion. Hepatobiliary: No focal liver abnormality is seen. Patient is post cholecystectomy. Slight prominence of the biliary tree likely related to reservoir effect. No calcified intraductal gallstones. Pancreas: Unremarkable. No pancreatic ductal dilatation or surrounding inflammatory changes. Spleen: Normal in size without focal abnormality. Adrenals/Urinary Tract: Normal adrenal glands. Increasing nonspecific bilateral perinephric stranding. No visible or concerning renal lesions. No urolithiasis or hydronephrosis. Urinary bladder is unremarkable. Stomach/Bowel: Distal esophagus, stomach and duodenal sweep are unremarkable. No small bowel wall thickening or dilatation. No evidence of obstruction. The appendix is surgically absent. No colonic dilatation or wall thickening. Minimal rectal wall thickening. Vascular/Lymphatic: Atherosclerotic plaque within the normal caliber aorta. No suspicious or enlarged lymph nodes in the included lymphatic chains. Reproductive: Uterus is surgically absent. No concerning  adnexal lesions. Suspect retained ovarian tissue. Other: There is extensive soft tissue stranding and phlegmonous change in the superior gluteal cleft extending to the tip of the sacrum and towards the mesorectal fat within ill-defined possible fluid collection along the left lateral  aspect of this inflammation measuring approximately 2 x 1 x 1 cm in size. Inflammatory change extends to the levator plate with some hazy intersphincteric stranding which could suggest some transsphincteric extension. No soft tissue gas is seen. No free fluid or air in the abdomen or pelvis. Peritoneal catheter terminates in the left lower quadrant. Musculoskeletal: No features of subjacent osteomyelitis of the sacrum or coccyx. No acute osseous abnormality or suspicious osseous lesion. Mild levocurvature of the spine is likely positional. IMPRESSION: 1. Extensive stranding and phlegmonous change in the superior gluteal cleft and ischioanal fossa extending to the tip of the sacrum and towards the levator plate within ill-defined possible fluid collection along the left lateral aspect of this measuring approximately 2 x 1 x 1 cm in size possibly reflecting developing abscess. Inflammatory change extends to the levator plate with some hazy intersphincteric stranding which could suggest some transsphincteric extension. No soft tissue gas is seen. No features of subjacent osteomyelitis of the sacrum or coccyx. 2. Increasing bilateral nonspecific perinephric stranding. Could be related to patient's decreased renal function though could consider urinalysis to exclude superimposed urinary tract infection. 3. Peritoneal catheter, likely peritoneal dialysis catheter terminating in the left lower quadrant. 4.  Aortic Atherosclerosis (ICD10-I70.0). Electronically Signed: By: Lovena Le M.D. On: 12/25/2018 16:15   DG Sacrum/Coccyx  Result Date: 12/23/2018 CLINICAL DATA:  Pt in with tailbone pain s/p fall 4 days ago. Able to ambulate, states it hurst to sit as there is a knot to her L buttocks EXAM: SACRUM AND COCCYX - 2+ VIEW COMPARISON:  None. FINDINGS: There is no evidence of fracture or other focal bone lesions. IMPRESSION: Negative. Electronically Signed   By: Lajean Manes M.D.   On: 12/23/2018 17:10   MR BRAIN WO  CONTRAST  Result Date: 01/13/2019 CLINICAL DATA:  Generalize weakness, left worse than right, affecting the arms and legs. Symptoms began about 2 weeks ago. EXAM: MRI HEAD WITHOUT CONTRAST TECHNIQUE: Multiplanar, multiecho pulse sequences of the brain and surrounding structures were obtained without intravenous contrast. COMPARISON:  05/09/2012 FINDINGS: Brain: Abnormal T2 and FLAIR signal in the pons most consistent with chronic small vessel ischemic change. No focal cerebellar finding. Cerebral hemispheres show a few foci of T2 and FLAIR signal within the white matter including a periventricular focus on the left adjacent to the posterior body of the lateral ventricle. These findings were not present in 2014. The differential diagnosis is that of an early manifestation of small vessel change versus demyelinating disease/multiple sclerosis. No cortical or large vessel territory abnormality. No mass lesion, hemorrhage, hydrocephalus or extra-axial collection. Vascular: Major vessels at the base of the brain show flow. Skull and upper cervical spine: Negative Sinuses/Orbits: Mucosal thickening of the right maxillary sinus. Orbits negative. Other: None IMPRESSION: No acute or subacute finding. Compared to the study of 2014, there are now areas of T2 and FLAIR signal within the pons and within the cerebral hemispheric white matter, including a periventricular white matter focus adjacent to the posterior body of the left lateral ventricle. Differential diagnosis for these findings is that of an early manifestation of small vessel disease versus demyelinating disease/multiple sclerosis. As the patient has a history of diabetes, hypertension and vascular disease, small-vessel disease is favored. Electronically  Signed   By: Nelson Chimes M.D.   On: 01/13/2019 17:54   MR PELVIS WO CONTRAST  Result Date: 01/10/2019 CLINICAL DATA:  Presacral abscess drainage January 01, 2019. Pain in the pelvis. History of perirectal  abscess. EXAM: MRI PELVIS WITHOUT CONTRAST TECHNIQUE: Multiplanar multisequence MR imaging of the pelvis was performed. No intravenous contrast was administered. COMPARISON:  12/31/2018 FINDINGS: Nonstandard positioning was employed due to the patient's pain. Urinary Tract:  Urinary bladder unremarkable. Bowel:  No dilated bowel in the pelvis. Vascular/Lymphatic: No pathologic regional adenopathy observed. Reproductive:  Prior hysterectomy. Adnexa unremarkable. Other: Moderate pelvic ascites, new compared to the 12/31/2018 exam. Small amount of ascites along the right paracolic gutter. Musculoskeletal: Extending under the tip of the coccyx but muscle along the posterior superficial margin of the coccyx and lower sacrum, we demonstrate a 7.0 by 4.9 by 8.3 cm (volume = 150 cm^3) abnormal fluid collection compatible with abscess. This has increased in volume compared to previous and likely represents some reaccumulation or reforming of the abscess. This partially extends along the medial margins of the gluteus maximus muscles, which demonstrate adjacent edema compatible with myositis. There is likely some myositis involving the superficial posterior portions of the spinalis thoracis muscles behind the sacrum. There is only a small amount of presacral edema, and no drainable collection anterior to the sacrum at this time. Much of the current abscess is very superficial within the subcutaneous tissues along the posteroinferior margin of the intergluteal cleft. There is subcutaneous edema along the lower anterior abdominal wall, and tracking anteriorly in the right thigh. IMPRESSION: 1. Interval increase in size of the abscess along the posterior superficial margin of the coccyx and lower sacrum, now measuring 150 cubic cm. 2. Moderate pelvic ascites, new compared to 12/20/20202020 exam. 3. There is some myositis involving the superficial posterior portions of the spinalis thoracis muscles behind the sacrum, and of the  medial gluteus maximus muscles adjacent to the abscess. 4. There is some subcutaneous edema along the lower anterior abdominal wall and tracking anteriorly in the right thigh. Electronically Signed   By: Van Clines M.D.   On: 01/10/2019 18:34   MR PELVIS WO CONTRAST  Result Date: 12/31/2018 CLINICAL DATA:  History of abscess about the inner rectal region. EXAM: MRI PELVIS WITHOUT CONTRAST TECHNIQUE: Multiplanar multisequence MR imaging of the pelvis was performed. No intravenous contrast was administered. COMPARISON:  CT study of 12/25/2018 and 12/27/2018 FINDINGS: Urinary Tract: Urinary bladder is moderately distended without signs of hydronephrosis of the distal ureters. Bowel: Visualized bowel is unremarkable aside from some mild thickening of the distal rectum. Assessment is limited on today's study. Vascular/Lymphatic: Is vascular structures not well assessed given lack of intravenous contrast. Appropriate flow voids are present in the bilateral pelvic vasculature. Reproductive:  Post hysterectomy. Other:  Is diffuse body wall edema. Musculoskeletal: Perisacral and coccygeal fluid collection with complex features measuring 5.2 x 3.8 x 8.9 cm. Edema extends to the levator plate as does fluid. Perirectal edema is also noted on today's study. There is no clear evidence of rectal involvement aside from the edema this changes. Stranding also extends to the posterior margin of the puborectalis muscle and the sphincter complex though the fluid collection does not extend to this level. Mild increased T2 signal in the sacrum and coccyx. Signs of low T2 signal throughout visualized bony structures aside from the last sacral segment in the coccyx. Is Assessment overall is limited due to patient position and discomfort with inability to Ree  acquire limited sequences Fluid in stranding extends into the gluteal cleft left greater than right. High signal also involves the inferior margin of the sphincter complex  on the left greater than right. IMPRESSION: 1. Peri sacral and coccygeal fluid collection with stranding extending into the levator plate and sphincter complex. No clear evidence of rectal involvement. Inflammatory changes extend into the left gluteal cleft and also involve the inferior margin of the anal sphincter complex. Anoscopy and or sigmoidoscopy may be helpful for further assessment given above findings. 2. Mild increased T2 signal in the sacrum and coccyx. Given recent history of trauma this is nonspecific, osteomyelitis or posttraumatic changes are considered. Given proximity of fluid osteomyelitis is favored at this time. 3. Diffuse body wall edema. 4. Post hysterectomy. Electronically Signed   By: Zetta Bills M.D.   On: 12/31/2018 09:49   CT ABDOMEN PELVIS W CONTRAST  Result Date: 12/27/2018 CLINICAL DATA:  Anal/rectal abscess EXAM: CT ABDOMEN AND PELVIS WITH CONTRAST TECHNIQUE: Multidetector CT imaging of the abdomen and pelvis was performed using the standard protocol following bolus administration of intravenous contrast. CONTRAST:  117mL OMNIPAQUE IOHEXOL 300 MG/ML  SOLN COMPARISON:  12/25/2018 FINDINGS: Lower chest: No acute abnormality Hepatobiliary: No focal liver abnormality is seen. Status post cholecystectomy. No biliary dilatation. Pancreas: No focal abnormality or ductal dilatation. Spleen: No focal abnormality.  Normal size. Adrenals/Urinary Tract: No adrenal abnormality. No focal renal abnormality. No stones or hydronephrosis. Urinary bladder is unremarkable. Stomach/Bowel: Stomach, large and small bowel grossly unremarkable. Vascular/Lymphatic: Aortoiliac atherosclerosis. No aneurysm or adenopathy. Reproductive: Prior hysterectomy.  No adnexal masses. Other: No free fluid or free air. Again noted is inflammation/stranding in the superior gluteal cleft. Enlarging subcutaneous fluid collection noted in the left medial buttock measuring 2.9 x 1.9 cm on image 92 of series 5.  Musculoskeletal: No acute bony abnormality. IMPRESSION: Continued stranding/inflammation in the gluteal subcutaneous soft tissues at the superior gluteal cleft with enlarging fluid collection in the medial left buttock subcutaneous soft tissues measuring 2.9 x 1.9 cm. Aortoiliac atherosclerosis. Prior cholecystectomy and hysterectomy. Electronically Signed   By: Rolm Baptise M.D.   On: 12/27/2018 17:47   IR US Guide Bx Asp/Drain  Result Date: 01/01/2019 INDICATION: 48 year old female with a complex peri sacral and coccygeal fluid collection of uncertain etiology. EXAM: Ultrasound-guided aspiration MEDICATIONS: The patient is currently admitted to the hospital and receiving intravenous antibiotics. The antibiotics were administered within an appropriate time frame prior to the initiation of the procedure. ANESTHESIA/SEDATION: Fentanyl 100 mcg IV; Versed 2 mg IV Moderate Sedation Time:  17 minutes The patient was continuously monitored during the procedure by the interventional radiology nurse under my direct supervision. COMPLICATIONS: None immediate. PROCEDURE: Informed written consent was obtained from the patient after a thorough discussion of the procedural risks, benefits and alternatives. All questions were addressed. A timeout was performed prior to the initiation of the procedure. The region of interest was interrogated with ultrasound. There is a very ill-defined and extremely heterogeneous complex cystic collection in the superficial subcutaneous tissues of the gluteal cleft extending into the medial aspect of the buttock bilaterally. A suitable skin entry site was selected and marked. The overlying skin was sterilely prepped and draped in standard fashion using chlorhexidine skin prep. Local anesthesia was attained by infiltration with 1% lidocaine. A small dermatotomy was made. Under real-time sonographic guidance, an 18 gauge trocar needle was advanced into the fluid collection. Aspiration was  performed while manipulating the needle into all of the visible portions of  the collection. The collection is extremely thick and may represents upon a fide adipose tissue. Only a trace amount of opaque potentially purulent fluid was successfully aspirated. The volume is perhaps 1-2 mL. The sample was sent for Gram stain and culture. IMPRESSION: 1. Extremely complex fluid collection in the superficial subcutaneous soft tissues of the gluteal cleft extending in the medial aspect of the buttock bilaterally. 2. Aspiration was minimally successful yielding only 1-2 mL of opaque tan potentially purulent fluid which was sent for Gram stain and culture. Differential considerations include panniculitis versus a chronic inflammatory process such as fat necrosis. Signed, Criselda Peaches, MD, [facility] Radiology Electronically Signed   By: Jacqulynn Cadet M.D.   On: 01/01/2019 19:50   MYOCARDIAL PERFUSION IMAGING  Result Date: 12/21/2018  Nuclear stress EF: 44%. The left ventricular ejection fraction is moderately decreased (30-44%).  There was no ST segment deviation noted during stress.  This is a low risk study. There is no evidence of ischemia or previous myocardial infarction.  The study is normal.    ECHOCARDIOGRAM COMPLETE  Result Date: 12/22/2018   ECHOCARDIOGRAM REPORT   Patient Name:   Susan Horton Date of Exam: 12/22/2018 Medical Rec #:  [id number]     Height:       65.0 in Accession #:    [id number]    Weight:       163.0 lb Date of Birth:  1970/11/30      BSA:          1.81 m Patient Age:    73 years      BP:           142/68 mmHg Patient Gender: F             HR:           114 bpm. Exam Location:  [place] Procedure: 2D Echo, Cardiac Doppler and Color Doppler Indications:    R06.02 SOB; I40.9 Acute myocarditis  History:        Patient has prior history of Echocardiogram examinations, most                 recent 06/19/2017. CHF, CAD,  Signs/Symptoms:Chest Pain; Risk                 Factors:Hypertension, Diabetes and Dyslipidemia. Chronic kidney                 disease. End stage renal disease. PVD.  Sonographer:    Diamond Nickel RCS Referring Phys: [place]  1. Left ventricular ejection fraction, by visual estimation, is 60 to 65%. The left ventricle has normal function. There is mildly increased left ventricular hypertrophy.  2. The left ventricle has no regional wall motion abnormalities.  3. Global right ventricle has normal systolic function.The right ventricular size is normal. No increase in right ventricular wall thickness.  4. Left atrial size was normal.  5. Right atrial size was normal.  6. The mitral valve is normal in structure. Trivial mitral valve regurgitation. No evidence of mitral stenosis.  7. The tricuspid valve is normal in structure. Tricuspid valve regurgitation is mild.  8. The aortic valve is tricuspid. Aortic valve regurgitation is trivial. Mild aortic valve sclerosis without stenosis.  9. The pulmonic valve was grossly normal. Pulmonic valve regurgitation is trivial. 10. The inferior vena cava is normal in size with greater than 50% respiratory variability, suggesting right atrial pressure of 3  mmHg. FINDINGS  Left Ventricle: Left ventricular ejection fraction, by visual estimation, is 60 to 65%. The left ventricle has normal function. The left ventricle has no regional wall motion abnormalities. There is mildly increased left ventricular hypertrophy. Normal left atrial pressure. Right Ventricle: The right ventricular size is normal. No increase in right ventricular wall thickness. Global RV systolic function is has normal systolic function. Left Atrium: Left atrial size was normal in size. Right Atrium: Right atrial size was normal in size Pericardium: There is no evidence of pericardial effusion. Mitral Valve: The mitral valve is normal in structure. Trivial mitral valve regurgitation. No  evidence of mitral valve stenosis by observation. Tricuspid Valve: The tricuspid valve is normal in structure. Tricuspid valve regurgitation is mild. Aortic Valve: The aortic valve is tricuspid. Aortic valve regurgitation is trivial. Mild aortic valve sclerosis is present, with no evidence of aortic valve stenosis. Pulmonic Valve: The pulmonic valve was grossly normal. Pulmonic valve regurgitation is trivial. Pulmonic regurgitation is trivial. Aorta: The aortic root, ascending aorta and aortic arch are all structurally normal, with no evidence of dilitation or obstruction. Venous: The inferior vena cava is normal in size with greater than 50% respiratory variability, suggesting right atrial pressure of 3 mmHg. IAS/Shunts: No atrial level shunt detected by color flow Doppler. There is no evidence of a patent foramen ovale. No ventricular septal defect is seen or detected. There is no evidence of an atrial septal defect.  LEFT VENTRICLE PLAX 2D LVIDd:         3.70 cm LVIDs:         2.60 cm LV PW:         1.60 cm LV IVS:        1.30 cm LVOT diam:     2.05 cm LV SV:         34 ml LV SV Index:   18.04 LVOT Area:     3.30 cm  RIGHT VENTRICLE RV S prime:     10.90 cm/s TAPSE (M-mode): 2.4 cm LEFT ATRIUM             Index       RIGHT ATRIUM          Index LA diam:        3.10 cm 1.71 cm/m  RA Area:     9.84 cm LA Vol (A2C):   37.6 ml 20.73 ml/m RA Volume:   20.60 ml 11.36 ml/m LA Vol (A4C):   33.4 ml 18.42 ml/m LA Biplane Vol: 36.8 ml 20.29 ml/m  AORTIC VALVE LVOT Vmax:   107.33 cm/s LVOT Vmean:  64.867 cm/s LVOT VTI:    0.185 m  AORTA Ao Root diam: 3.20 cm  SHUNTS Systemic VTI:  0.18 m Systemic Diam: 2.05 cm  Jenkins Rouge MD Electronically signed by Jenkins Rouge MD Signature Date/Time: 12/22/2018/4:51:37 PM    Final     Results/Tests Pending at Time of Discharge: none  Discharge Medications:  Allergies as of 01/14/2019      Reactions   Repatha [evolocumab] Itching   Lisinopril Cough      Medication List     STOP taking these medications   Oxycodone HCl 10 MG Tabs   senna-docusate 8.6-50 MG tablet Commonly known as: Senokot-S     TAKE these medications   albuterol 108 (90 Base) MCG/ACT inhaler Commonly known as: VENTOLIN HFA Inhale 1-2 puffs into the lungs every 6 (six) hours as needed for wheezing or shortness of breath.   amitriptyline  10 MG tablet Commonly known as: ELAVIL Take 1 tablet (10 mg total) by mouth at bedtime.   aspirin 81 MG chewable tablet Chew 1 tablet (81 mg total) by mouth daily.   calcitRIOL 0.5 MCG capsule Commonly known as: ROCALTROL Take 1.5 mcg by mouth daily.   calcium acetate 667 MG capsule Commonly known as: PHOSLO Take 1,334-2,001 mg by mouth See admin instructions. Take three capsules (2001 mg) by mouth up to three times daily with meals and two capsules (1334 mg) with snacks   clopidogrel 75 MG tablet Commonly known as: PLAVIX Take 1 tablet (75 mg total) by mouth daily with breakfast.   DIALYVITE 800 WITH ZINC 0.8 MG Tabs Take 1 tablet by mouth daily.   erythromycin 250 MG tablet Commonly known as: E-MYCIN Take 1 tablet (250 mg total) by mouth 3 (three) times daily before meals for 14 days.   fluconazole 150 MG tablet Commonly known as: Diflucan Take 1 tablet (150 mg total) by mouth daily.   Fluticasone-Salmeterol 250-50 MCG/DOSE Aepb Commonly known as: ADVAIR Inhale 1 puff into the lungs 2 (two) times daily. What changed:   when to take this  reasons to take this   gentamicin cream 0.1 % Commonly known as: GARAMYCIN Apply 1 application topically See admin instructions. Apply to access site nightly   insulin regular 100 units/mL injection Commonly known as: HumuLIN R Inject 0.2-0.25 mLs (20-25 Units total) into the skin See admin instructions. What changed:   how much to take  when to take this  additional instructions   Iron 325 (65 Fe) MG Tabs Take 325 mg by mouth daily.   isosorbide mononitrate 30 MG 24 hr  tablet Commonly known as: IMDUR Take 0.5 tablets (15 mg total) by mouth daily.   lidocaine-prilocaine cream Commonly known as: EMLA Apply 1 application topically See admin instructions. Apply small amount to access site (AVF) 30 minutes before hemodialysis, cover with occlusive dressing (Saran Wrap) if going out to dialysis center   linezolid 600 MG tablet Commonly known as: ZYVOX Take 1 tablet (600 mg total) by mouth 2 (two) times daily for 14 days.   lovastatin 20 MG tablet Commonly known as: MEVACOR Take 20 mg by mouth at bedtime.   methocarbamol 500 MG tablet Commonly known as: Robaxin Take 1 tablet (500 mg total) by mouth 2 (two) times daily as needed for muscle spasms.   metoCLOPramide 5 MG tablet Commonly known as: REGLAN TAKE 1 TABLET BY MOUTH 4 TIMES DAILY BEFORE MEALS AND AT BEDTIME. What changed: See the new instructions.   metoprolol succinate 50 MG 24 hr tablet Commonly known as: TOPROL-XL Take 1 tablet (50 mg total) by mouth daily.   montelukast 10 MG tablet Commonly known as: SINGULAIR Take 1 tablet (10 mg total) by mouth at bedtime. What changed:   when to take this  reasons to take this   mupirocin cream 2 % Commonly known as: BACTROBAN Apply 1 application topically 2 (two) times daily as needed. To affected areas and cover with non-stick gauze. What changed:   reasons to take this  additional instructions   nitroGLYCERIN 0.4 MG SL tablet Commonly known as: Nitrostat Place 1 tablet (0.4 mg total) under the tongue every 5 (five) minutes as needed for chest pain.   pantoprazole 40 MG tablet Commonly known as: PROTONIX TAKE 1 TABLET BY MOUTH DAILY.   polyethylene glycol 17 g packet Commonly known as: MIRALAX / GLYCOLAX Take 17 g by mouth 2 (two) times daily  for 7 days.   pregabalin 75 MG capsule Commonly known as: Lyrica Take 1 capsule (75 mg total) by mouth daily.   Toujeo SoloStar 300 UNIT/ML Sopn Generic drug: Insulin Glargine (1 Unit  Dial) Inject 10 Units into the skin 2 (two) times daily.   trimethoprim-polymyxin b ophthalmic solution Commonly known as: POLYTRIM Place 1 drop into both eyes See admin instructions. Instill one drop into both eyes four times daily for 2 days after each monthly eye injection            Durable Medical Equipment  (From admission, onward)         Start     Ordered   01/12/19 1812  For home use only DME Walker rolling  Once    Question Answer Comment  Patient needs a walker to treat with the following condition Abscess   Patient needs a walker to treat with the following condition Impaired ambulation      01/12/19 1812          Discharge Instructions: Please refer to Patient Instructions section of EMR for full details.  Patient was counseled important signs and symptoms that should prompt return to medical care, changes in medications, dietary instructions, activity restrictions, and follow up appointments.   Follow-Up Appointments: Follow-up Information    [facility] Follow up.   Specialty: General Surgery Why: Our office will call you for a follow-up appointment in 3 weeks.  Bring photo ID and insurance information.  If you do not hear call 01/16/19 and ask for DOW appointment. Contact information: [place] [address] [phone]        Charlott Rakes, MD. Schedule an appointment as soon as possible for a visit.   Specialty: Family Medicine Contact information: [place] [address] [phone]        [facility] Follow up on 01/26/2019.   Specialty: Infectious Diseases Why: @ 10am. Please arrive 15 minutes prior to your appointment time.  Contact information: [place], [place] [phone] [place]          Rory Percy, DO 01/14/2019, 1:12 PM PGY-3, [place]

## 2019-01-10 NOTE — Consult Note (Signed)
Reason for Consult: gluteal abscess Referring Physician: Talbert Cage  Susan Horton is an 48 y.o. female.  HPI: 48 yo female recently discharged from similar event. She underwent aspiration of gluteal fluid collection that appeared to be hematoma. She was put on antibiotics due to leukocytosis which slowly improved. She never felt the pain went away. Now she feels her pain is worse. She cannot stand up by herself due to the pain. She denies fevers.  Past Medical History:  Diagnosis Date  . Abnormal stress test   . Anemia   . Angio-edema   . Asthma   . CAD (coronary artery disease)    DES to mid LAD July 2018, residual moderate RCA disease  . Cataract   . CKD (chronic kidney disease) stage 4, GFR 15-29 ml/min (HCC)    Dialysis T/Th/Sa  . DVT (deep venous thrombosis) ([place])    1996 during pregnancy, 2015 left leg  . Eczema   . Essential hypertension 12/11/2015  . Gastroparesis   . GERD (gastroesophageal reflux disease)   . Gluteal abscess 12/27/2018  . Hidradenitis   . Migraine   . Neuropathy   . Peripheral vascular disease ([place])    blood clot in leg  . Progressive angina ([place]) 06/05/2014   Chest pain  . Type 2 diabetes mellitus ([place])   . Type II diabetes mellitus ([place])   . Urticaria     Past Surgical History:  Procedure Laterality Date  . A/V FISTULAGRAM N/A 06/22/2017   Procedure: A/V FISTULAGRAM - left arm;  Surgeon: [name], MD;  Location: [facility];  Service: Cardiovascular;  Laterality: N/A;  . ABDOMINAL AORTOGRAM W/LOWER EXTREMITY Bilateral 12/14/2018   Procedure: ABDOMINAL AORTOGRAM W/LOWER EXTREMITY;  Surgeon: [name], MD;  Location: [facility];  Service: Cardiovascular;  Laterality: Bilateral;  . ABDOMINOPLASTY    . ADENOIDECTOMY    . APPENDECTOMY  1995  . AV FISTULA PLACEMENT Left 02/01/2017   Procedure: ARTERIOVENOUS BRACHIOCEPHALIC (AV) FISTULA CREATION;  Surgeon: [name], MD;  Location: [facility];   Service: Vascular;  Laterality: Left;  . CHOLECYSTECTOMY  1993  . CORONARY ANGIOPLASTY WITH STENT PLACEMENT    . CORONARY STENT INTERVENTION N/A 08/05/2016   Procedure: Coronary Stent Intervention;  Surgeon: [name], MD;  Location: [facility];  Service: Cardiovascular;  Laterality: N/A;  . EXPLORATORY LAPAROTOMY  08/14/2005   lysis of adhesions, drainage of tubo-ovarian abscess  . FISTULA SUPERFICIALIZATION Left 09/14/2017   Procedure: FISTULA SUPERFICIALIZATION ARTERIOVENOUS FISTULA LEFT ARM;  Surgeon: [name], MD;  Location: [facility];  Service: Vascular;  Laterality: Left;  . FLEXIBLE SIGMOIDOSCOPY N/A 01/04/2019   Procedure: FLEXIBLE SIGMOIDOSCOPY;  Surgeon: Clarene Essex, MD;  Location: [facility];  Service: Endoscopy;  Laterality: N/A;  . HYDRADENITIS EXCISION  02/08/2011   Procedure: EXCISION HYDRADENITIS GROIN;  Surgeon: [name], MD;  Location: [facility];  Service: General;  Laterality: N/A;  Excisioin of Hidradenitis Left groin  . INGUINAL HIDRADENITIS EXCISION  07/06/2010   bilateral  . INSERTION OF DIALYSIS CATHETER N/A 09/14/2017   Procedure: INSERTION OF TUNNELED DIALYSIS CATHETER Right Internal Jugular;  Surgeon: [name], MD;  Location: [facility];  Service: Vascular;  Laterality: N/A;  . IR US GUIDE BX ASP/DRAIN  01/01/2019  . LEFT HEART CATH AND CORONARY ANGIOGRAPHY N/A 08/05/2016   Procedure: Left Heart Cath and Coronary Angiography;  Surgeon: [name], MD;  Location: [facility];  Service: Cardiovascular;  Laterality: N/A;  . PERIPHERAL VASCULAR INTERVENTION Left 12/14/2018   Procedure: PERIPHERAL VASCULAR INTERVENTION;  Surgeon: [name], MD;  Location: [facility];  Service: Cardiovascular;  Laterality: Left;  SFA  . REDUCTION MAMMAPLASTY  2002  . TONSILLECTOMY    . TUBAL LIGATION  1996  . VAGINAL HYSTERECTOMY  08/04/2005   and cysto    Family History  Problem Relation Age of Onset    . Allergic rhinitis Mother   . Hypertension Father   . Allergic rhinitis Father   . Hypertension Sister   . Allergic rhinitis Sister   . Hypertension Brother   . Allergic rhinitis Brother   . Breast cancer Cousin        x2    Social History:  reports that she quit smoking about 2 years ago. Her smoking use included cigarettes. She quit after 20.00 years of use. She has never used smokeless tobacco. She reports that she does not drink alcohol or use drugs.  Allergies:  Allergies  Allergen Reactions  . Repatha [Evolocumab] Itching  . Lisinopril Cough    Medications: I have reviewed the patient's current medications.  Results for orders placed or performed during the hospital encounter of 01/09/19 (from the past 48 hour(s))  Type and screen [place]     Status: None   Collection Time: 01/09/19  7:19 PM  Result Value Ref Range   ABO/RH(D) O POS    Antibody Screen NEG    Sample Expiration      01/12/2019,2359 Performed at [facility], [place] [address], [place], [place] [address]   Comprehensive metabolic panel     Status: Abnormal   Collection Time: 01/09/19  7:22 PM  Result Value Ref Range   Sodium 135 135 - 145 mmol/L   Potassium 4.2 3.5 - 5.1 mmol/L   Chloride 92 (L) 98 - 111 mmol/L   CO2 24 22 - 32 mmol/L   Glucose, Bld 93 70 - 99 mg/dL   BUN 93 (H) 6 - 20 mg/dL   Creatinine, Ser 11.48 (H) 0.44 - 1.00 mg/dL   Calcium 9.1 8.9 - 10.3 mg/dL   Total Protein 6.7 6.5 - 8.1 g/dL   Albumin 1.7 (L) 3.5 - 5.0 g/dL   AST 20 15 - 41 U/L   ALT 20 0 - 44 U/L   Alkaline Phosphatase 349 (H) 38 - 126 U/L   Total Bilirubin 0.5 0.3 - 1.2 mg/dL   GFR calc non Af Amer 3 (L) >60 mL/min   GFR calc Af Amer 4 (L) >60 mL/min   Anion gap 19 (H) 5 - 15    Comment: Performed at [facility], [place] [address] [place], [place] [address]  CBC WITH DIFFERENTIAL     Status: Abnormal   Collection Time: 01/09/19  7:22 PM  Result Value Ref Range   WBC 29.6 (H) 4.0 -  10.5 K/uL   RBC 2.98 (L) 3.87 - 5.11 MIL/uL   Hemoglobin 8.2 (L) 12.0 - 15.0 g/dL   HCT 26.2 (L) 36.0 - 46.0 %   MCV 87.9 80.0 - 100.0 fL   MCH 27.5 26.0 - 34.0 pg   MCHC 31.3 30.0 - 36.0 g/dL   RDW 15.1 11.5 - 15.5 %   Platelets 510 (H) 150 - 400 K/uL   nRBC 0.1 0.0 - 0.2 %   Neutrophils Relative % 89 %   Neutro Abs 26.2 (H) 1.7 - 7.7 K/uL  Lymphocytes Relative 6 %   Lymphs Abs 1.8 0.7 - 4.0 K/uL   Monocytes Relative 4 %   Monocytes Absolute 1.3 (H) 0.1 - 1.0 K/uL   Eosinophils Relative 0 %   Eosinophils Absolute 0.1 0.0 - 0.5 K/uL   Basophils Relative 0 %   Basophils Absolute 0.1 0.0 - 0.1 K/uL   Immature Granulocytes 1 %   Abs Immature Granulocytes 0.32 (H) 0.00 - 0.07 K/uL    Comment: Performed at [facility] [address] [place], [place] [address]  Protime-INR     Status: None   Collection Time: 01/09/19  7:22 PM  Result Value Ref Range   Prothrombin Time 14.4 11.4 - 15.2 seconds   INR 1.1 0.8 - 1.2    Comment: (NOTE) INR goal varies based on device and disease states. Performed at [facility], [place] [address], [place], [place] [address]   CBG monitoring, ED     Status: None   Collection Time: 01/10/19  8:34 AM  Result Value Ref Range   Glucose-Capillary 91 70 - 99 mg/dL  Lactic acid, plasma     Status: None   Collection Time: 01/10/19 10:10 AM  Result Value Ref Range   Lactic Acid, Venous 1.1 0.5 - 1.9 mmol/L    Comment: Performed at [facility] [address], [place], [place] [address]  Blood culture (routine x 2)     Status: None (Preliminary result)   Collection Time: 01/10/19 10:12 AM   Specimen: BLOOD  Result Value Ref Range   Specimen Description BLOOD RIGHT ANTECUBITAL    Special Requests      BOTTLES DRAWN AEROBIC AND ANAEROBIC Blood Culture adequate volume   Culture      NO GROWTH < 12 HOURS Performed at [facility], [place] [address] [place], [place] [address]    Report Status PENDING   CBG monitoring, ED      Status: Abnormal   Collection Time: 01/10/19  8:03 PM  Result Value Ref Range   Glucose-Capillary 102 (H) 70 - 99 mg/dL    MR PELVIS WO CONTRAST  Result Date: 01/10/2019 CLINICAL DATA:  Presacral abscess drainage January 01, 2019. Pain in the pelvis. History of perirectal abscess. EXAM: MRI PELVIS WITHOUT CONTRAST TECHNIQUE: Multiplanar multisequence MR imaging of the pelvis was performed. No intravenous contrast was administered. COMPARISON:  12/31/2018 FINDINGS: Nonstandard positioning was employed due to the patient's pain. Urinary Tract:  Urinary bladder unremarkable. Bowel:  No dilated bowel in the pelvis. Vascular/Lymphatic: No pathologic regional adenopathy observed. Reproductive:  Prior hysterectomy. Adnexa unremarkable. Other: Moderate pelvic ascites, new compared to the 12/31/2018 exam. Small amount of ascites along the right paracolic gutter. Musculoskeletal: Extending under the tip of the coccyx but muscle along the posterior superficial margin of the coccyx and lower sacrum, we demonstrate a 7.0 by 4.9 by 8.3 cm (volume = 150 cm^3) abnormal fluid collection compatible with abscess. This has increased in volume compared to previous and likely represents some reaccumulation or reforming of the abscess. This partially extends along the medial margins of the gluteus maximus muscles, which demonstrate adjacent edema compatible with myositis. There is likely some myositis involving the superficial posterior portions of the spinalis thoracis muscles behind the sacrum. There is only a small amount of presacral edema, and no drainable collection anterior to the sacrum at this time. Much of the current abscess is very superficial within the subcutaneous tissues along the posteroinferior margin of the intergluteal  cleft. There is subcutaneous edema along the lower anterior abdominal wall, and tracking anteriorly in the right thigh. IMPRESSION: 1. Interval increase in size of the abscess along the  posterior superficial margin of the coccyx and lower sacrum, now measuring 150 cubic cm. 2. Moderate pelvic ascites, new compared to 12/20/20202020 exam. 3. There is some myositis involving the superficial posterior portions of the spinalis thoracis muscles behind the sacrum, and of the medial gluteus maximus muscles adjacent to the abscess. 4. There is some subcutaneous edema along the lower anterior abdominal wall and tracking anteriorly in the right thigh. Electronically Signed   By: Van Clines M.D.   On: 01/10/2019 18:34    Review of Systems  Constitutional: Negative for chills and fever.  HENT: Negative for hearing loss.   Eyes: Negative for blurred vision and double vision.  Respiratory: Negative for cough and hemoptysis.   Cardiovascular: Negative for chest pain and palpitations.  Gastrointestinal: Negative for abdominal pain, nausea and vomiting.  Genitourinary: Negative for dysuria and urgency.  Musculoskeletal: Positive for back pain. Negative for myalgias and neck pain.  Skin: Negative for itching and rash.  Neurological: Negative for dizziness, tingling and headaches.  Endo/Heme/Allergies: Does not bruise/bleed easily.  Psychiatric/Behavioral: Negative for depression and suicidal ideas.   Blood pressure (!) 143/69, pulse (!) 105, temperature 98.6 F (37 C), temperature source Oral, resp. rate 16, SpO2 100 %. Physical Exam  Vitals reviewed. Constitutional: She is oriented to person, place, and time. She appears well-developed and well-nourished.  HENT:  Head: Normocephalic and atraumatic.  Eyes: Pupils are equal, round, and reactive to light. Conjunctivae and EOM are normal.  Cardiovascular: Normal rate and regular rhythm.  Respiratory: Effort normal and breath sounds normal.  GI: Soft. Bowel sounds are normal. She exhibits no distension. There is no abdominal tenderness.  Musculoskeletal:        General: Normal range of motion.     Cervical back: Normal range of  motion and neck supple.  Neurological: She is alert and oriented to person, place, and time.  Skin: Skin is warm and dry.  Edema at the superior gluteal fold, tender to palpation  Psychiatric: She has a normal mood and affect. Her behavior is normal.     Assessment/Plan: 48 yo female with leukocytosis increased since discharge, MRI showing fluid collection at the gluteal region and pain on palpation with generalized edema. -agree with antibiotics -likely incision and drainage in the morning  Arta Bruce Kalina Morabito 01/10/2019, 9:46 PM

## 2019-01-10 NOTE — Progress Notes (Signed)
[facility] Progress Note    Assessment/ Plan:   1.  Sacral, coccygeal and gluteal abscesses: Status post aspiration of perisacral fluid collection on 12/21 with negative Gram stain and cultures.  Leukocytosis had been improving last week on  antibiotic therapy with linezolid (narrowed down from aztreonam/vancomycin/Flagyl). 2. ESRD: on PD followed by Dr. Jimmy Footman [facility] 4 exchanges with 2L at night 2 1.5% and 1 2.5% with 5th fill of 1L which she drains at midday.  Continue peritoneal dialysis prescription without 5th fill and midday drain, no evidence of PD peritonitis based on exam.  Previous imaging does not show communication of abscess with peritoneum.  Will continue [facility]CP[facility] prescription; will also d/w pt whether HD is a possibility as all the cyclers are being used. Also d/w charge tonight whether using the cycler during the daytime is a possibility.   3. Anemia: With some downtrending hemoglobin/hematocrit noted on this morning's labs with ongoing PRBC transfusion when seen. 4. CKD-MBD: Calcium level currently at goal with rising phosphorus level. 5. Nutrition: Continue current diet with ONS/protein supplementation to optimize wound healing/PD losses.  She has chronic gastroparesis on metoclopramide that further compromise her nutritional status. 6. Hypertension: Blood pressure currently acceptable, continue to follow with PD. 7. CHF: compensated 8. DM  Subjective:   Describes pain in her back and buttocks as 10/10. Denies f/c/n/v.   Objective:   BP (!) 162/69 (BP Location: Right Arm)   Pulse (!) 130   Temp 98.2 F (36.8 C) (Rectal)   Resp 16   SpO2 99%  No intake or output data in the 24 hours ending 01/10/19 1927 Weight change:   Physical Exam: Gen: Comfortably resting in bed CVS: Pulse regular rhythm, normal rate Resp: Clear to auscultation, no rales/rhonchi Abd: Soft, mild global distention, nontender Ext: No lower extremity edema Back: tender to palpation  in lower lumbar and sacral area w/ extension to buttocks Access: lt BBT gd thrill  Imaging: MR PELVIS WO CONTRAST  Result Date: 01/10/2019 CLINICAL DATA:  Presacral abscess drainage January 01, 2019. Pain in the pelvis. History of perirectal abscess. EXAM: MRI PELVIS WITHOUT CONTRAST TECHNIQUE: Multiplanar multisequence MR imaging of the pelvis was performed. No intravenous contrast was administered. COMPARISON:  12/31/2018 FINDINGS: Nonstandard positioning was employed due to the patient's pain. Urinary Tract:  Urinary bladder unremarkable. Bowel:  No dilated bowel in the pelvis. Vascular/Lymphatic: No pathologic regional adenopathy observed. Reproductive:  Prior hysterectomy. Adnexa unremarkable. Other: Moderate pelvic ascites, new compared to the 12/31/2018 exam. Small amount of ascites along the right paracolic gutter. Musculoskeletal: Extending under the tip of the coccyx but muscle along the posterior superficial margin of the coccyx and lower sacrum, we demonstrate a 7.0 by 4.9 by 8.3 cm (volume = 150 cm^3) abnormal fluid collection compatible with abscess. This has increased in volume compared to previous and likely represents some reaccumulation or reforming of the abscess. This partially extends along the medial margins of the gluteus maximus muscles, which demonstrate adjacent edema compatible with myositis. There is likely some myositis involving the superficial posterior portions of the spinalis thoracis muscles behind the sacrum. There is only a small amount of presacral edema, and no drainable collection anterior to the sacrum at this time. Much of the current abscess is very superficial within the subcutaneous tissues along the posteroinferior margin of the intergluteal cleft. There is subcutaneous edema along the lower anterior abdominal wall, and tracking anteriorly in the right thigh. IMPRESSION: 1. Interval increase in size of the abscess along  the posterior superficial margin of the  coccyx and lower sacrum, now measuring 150 cubic cm. 2. Moderate pelvic ascites, new compared to 12/20/20202020 exam. 3. There is some myositis involving the superficial posterior portions of the spinalis thoracis muscles behind the sacrum, and of the medial gluteus maximus muscles adjacent to the abscess. 4. There is some subcutaneous edema along the lower anterior abdominal wall and tracking anteriorly in the right thigh. Electronically Signed   By: Van Clines M.D.   On: 01/10/2019 18:34    Labs: BMET Recent Labs  Lab 01/04/19 1209 01/05/19 0454 01/09/19 1922  NA 130* 133* 135  K 3.3* 3.5 4.2  CL 94* 94* 92*  CO2 24 24 24   GLUCOSE 75 108* 93  BUN 55* 54* 93*  CREATININE 8.01* 7.99* 11.48*  CALCIUM 8.5* 8.6* 9.1  PHOS 6.4* 6.8*  --    CBC Recent Labs  Lab 01/04/19 1907 01/05/19 0454 01/05/19 1520 01/09/19 1922  WBC 41.3* 37.7*  --  29.6*  NEUTROABS  --   --   --  26.2*  HGB 8.4* 7.9* 9.7* 8.2*  HCT 25.4* 24.0* 29.0* 26.2*  MCV 83.3 82.8  --  87.9  PLT 475* 468*  --  510*    Medications:    . acetaminophen  650 mg Oral Q6H   Or  . acetaminophen  650 mg Rectal Q6H  . amitriptyline  10 mg Oral QHS  . calcitRIOL  1.5 mcg Oral Daily  . calcium acetate  1,334 mg Oral With snacks  . [START ON 01/11/2019] calcium acetate  2,001 mg Oral TID WC  . ferrous sulfate  325 mg Oral Daily  . fluticasone furoate-vilanterol  1 puff Inhalation Daily  . gentamicin cream  1 application Topical QHS  . insulin aspart  0-9 Units Subcutaneous Q4H  . isosorbide mononitrate  15 mg Oral Daily  . [START ON 01/12/2019] lidocaine-prilocaine  1 application Topical Q M,W,F-HD  . metoCLOPramide  5 mg Oral TID AC & HS  . metoprolol succinate  50 mg Oral Daily  . montelukast  10 mg Oral QHS  . multivitamin with minerals  1 tablet Oral Daily  . pantoprazole  40 mg Oral Daily  . polyethylene glycol  17 g Oral BID  . [START ON 01/11/2019] pravastatin  40 mg Oral q1800  . pregabalin  75 mg  Oral Daily  . senna-docusate  1 tablet Oral BID  . umeclidinium bromide  1 puff Inhalation Daily      Susan Santee, MD 01/10/2019, 7:27 PM

## 2019-01-10 NOTE — ED Notes (Signed)
Unable to collect second set of cultures

## 2019-01-10 NOTE — ED Notes (Signed)
This tech asked pt to do orthostatic vital signs. Pt stated they could not tolerate it at this time because of the pain.

## 2019-01-10 NOTE — ED Provider Notes (Signed)
[facility] EMERGENCY DEPARTMENT Provider Note   CSN: [id number] Arrival date & time: 01/09/19  1849     History No chief complaint on file.   Susan Horton is a 48 y.o. female with history of ESRD on peritoneal dialysis nightly, anemia, recent hospital admission for sacral phlegmon on 12/25 presents to the ER for evaluation of increased tail bone pain and swelling over the last 2 days. She presented to the ER on 12/14 for tailbone pain. She was admitted for treatment of sacral phlegmon that was treated with IV antibiotics, IR drainage. MRI showed possible osteomyelitis or traumatic changes from recent fall. GI became involved and did a sigmoidoscopy because patient was having bloody stools. States her sigmoidoscopy was normal. Since hospital discharge she has been taking oxycodone 1 tablet every 6 hours with adequate control of her pain however the last 2 days the pain has broken through and has been unbearable. Is constant, severe, radiates to left hip and entire left leg. She cannot walk or sit. She is tearful. Has also noticed increased swelling and firmness in the area of the wound and is worried that it has recollected. Her stools are no longer bloody, still slightly watery and green in color.  Denies fever or chills. Chronic nausea, vomiting and abdominal pain almost daily that she attributes to her gastroparesis but this is not any worse. Denies chest pain or shortness of breath. She does peritoneal dialysis at her home every night, she has been in the ER for more than 15 hours through the night and missed her dialysis session while in the ER. Her last session was 2 nights ago. HPI     Past Medical History:  Diagnosis Date  . Abnormal stress test   . Anemia   . Angio-edema   . Asthma   . CAD (coronary artery disease)    DES to mid LAD July 2018, residual moderate RCA disease  . Cataract   . CKD (chronic kidney disease) stage 4, GFR 15-29 ml/min (HCC)    Dialysis T/Th/Sa  . DVT (deep venous thrombosis) ([place])    1996 during pregnancy, 2015 left leg  . Eczema   . Essential hypertension 12/11/2015  . Gastroparesis   . GERD (gastroesophageal reflux disease)   . Gluteal abscess 12/27/2018  . Hidradenitis   . Migraine   . Neuropathy   . Peripheral vascular disease ([place])    blood clot in leg  . Progressive angina ([place]) 06/05/2014   Chest pain  . Type 2 diabetes mellitus ([place])   . Type II diabetes mellitus ([place])   . Urticaria     Patient Active Problem List   Diagnosis Date Noted  . Abscess of sacrum ([place])   . Peritonitis ([place])   . Gluteal abscess 12/25/2018  . Symptomatic anemia 09/16/2018  . Peripheral vascular disease, unspecified ([place]) 09/04/2018  . ESRD on dialysis ([place]) 10/11/2017  . Chronic diastolic CHF (congestive heart failure) ([place]) 06/18/2017  . Moderate persistent asthma 06/10/2017  . CKD (chronic kidney disease) stage 4, GFR 15-29 ml/min (HCC)   . CAD S/P mLAD PCI with DES 08/06/2016  . Presence of drug coated stent in LAD coronary artery 08/06/2016  . Carotid bruit present 06/15/2016  . Dyslipidemia 06/15/2016  . Smoker 06/15/2016  . Neuropathy 03/10/2016  . Essential hypertension, benign 12/11/2015  . Gastroparesis   . Uncontrolled type 2 diabetes mellitus with peripheral neuropathy ([place]) 06/05/2014    Past Surgical History:  Procedure Laterality Date  .  A/V FISTULAGRAM N/A 06/22/2017   Procedure: A/V FISTULAGRAM - left arm;  Surgeon: [name], MD;  Location: [facility];  Service: Cardiovascular;  Laterality: N/A;  . ABDOMINAL AORTOGRAM W/LOWER EXTREMITY Bilateral 12/14/2018   Procedure: ABDOMINAL AORTOGRAM W/LOWER EXTREMITY;  Surgeon: [name], MD;  Location: [facility];  Service: Cardiovascular;  Laterality: Bilateral;  . ABDOMINOPLASTY    . ADENOIDECTOMY    . APPENDECTOMY  1995  . AV FISTULA PLACEMENT Left 02/01/2017   Procedure: ARTERIOVENOUS BRACHIOCEPHALIC (AV)  FISTULA CREATION;  Surgeon: [name], MD;  Location: [facility];  Service: Vascular;  Laterality: Left;  . CHOLECYSTECTOMY  1993  . CORONARY ANGIOPLASTY WITH STENT PLACEMENT    . CORONARY STENT INTERVENTION N/A 08/05/2016   Procedure: Coronary Stent Intervention;  Surgeon: [name], MD;  Location: [facility];  Service: Cardiovascular;  Laterality: N/A;  . EXPLORATORY LAPAROTOMY  08/14/2005   lysis of adhesions, drainage of tubo-ovarian abscess  . FISTULA SUPERFICIALIZATION Left 09/14/2017   Procedure: FISTULA SUPERFICIALIZATION ARTERIOVENOUS FISTULA LEFT ARM;  Surgeon: [name], MD;  Location: [facility];  Service: Vascular;  Laterality: Left;  . FLEXIBLE SIGMOIDOSCOPY N/A 01/04/2019   Procedure: FLEXIBLE SIGMOIDOSCOPY;  Surgeon: Clarene Essex, MD;  Location: [facility];  Service: Endoscopy;  Laterality: N/A;  . HYDRADENITIS EXCISION  02/08/2011   Procedure: EXCISION HYDRADENITIS GROIN;  Surgeon: [name], MD;  Location: [facility];  Service: General;  Laterality: N/A;  Excisioin of Hidradenitis Left groin  . INGUINAL HIDRADENITIS EXCISION  07/06/2010   bilateral  . INSERTION OF DIALYSIS CATHETER N/A 09/14/2017   Procedure: INSERTION OF TUNNELED DIALYSIS CATHETER Right Internal Jugular;  Surgeon: [name], MD;  Location: [facility];  Service: Vascular;  Laterality: N/A;  . IR US GUIDE BX ASP/DRAIN  01/01/2019  . LEFT HEART CATH AND CORONARY ANGIOGRAPHY N/A 08/05/2016   Procedure: Left Heart Cath and Coronary Angiography;  Surgeon: [name], MD;  Location: [facility];  Service: Cardiovascular;  Laterality: N/A;  . PERIPHERAL VASCULAR INTERVENTION Left 12/14/2018   Procedure: PERIPHERAL VASCULAR INTERVENTION;  Surgeon: [name], MD;  Location: [facility];  Service: Cardiovascular;  Laterality: Left;  SFA  . REDUCTION MAMMAPLASTY  2002  . TONSILLECTOMY    . TUBAL LIGATION  1996  . VAGINAL HYSTERECTOMY   08/04/2005   and cysto     OB History   No obstetric history on file.     Family History  Problem Relation Age of Onset  . Allergic rhinitis Mother   . Hypertension Father   . Allergic rhinitis Father   . Hypertension Sister   . Allergic rhinitis Sister   . Hypertension Brother   . Allergic rhinitis Brother   . Breast cancer Cousin        x2    Social History   Tobacco Use  . Smoking status: Former Smoker    Years: 20.00    Types: Cigarettes    Quit date: 11/2016    Years since quitting: 2.1  . Smokeless tobacco: Never Used  Substance Use Topics  . Alcohol use: No    Alcohol/week: 0.0 standard drinks  . Drug use: No    Home Medications Prior to Admission medications   Medication Sig Start Date End Date Taking? Authorizing Provider  albuterol (PROVENTIL HFA;VENTOLIN HFA) 108 (90 BASE) MCG/ACT inhaler Inhale 1-2 puffs into the lungs every 6 (six) hours as needed for  wheezing or shortness of breath.    Yes [provider]  amitriptyline (ELAVIL) 10 MG tablet Take 1 tablet (10 mg total) by mouth at bedtime. 09/19/18  Yes Jamse Arn, MD  aspirin 81 MG chewable tablet Chew 1 tablet (81 mg total) by mouth daily. 08/07/16  Yes Cheryln Manly, NP  B Complex-C-Zn-Folic Acid (DIALYVITE 854 WITH ZINC) 0.8 MG TABS Take 1 tablet by mouth daily. 10/06/18  Yes [provider]  calcitRIOL (ROCALTROL) 0.5 MCG capsule Take 1.5 mcg by mouth daily. 09/11/18  Yes [provider]  calcium acetate (PHOSLO) 667 MG capsule Take 1,334-2,001 mg by mouth See admin instructions. Take three capsules (2001 mg) by mouth up to three times daily with meals and two capsules (1334 mg) with snacks 09/23/17  Yes [provider]  clopidogrel (PLAVIX) 75 MG tablet Take 1 tablet (75 mg total) by mouth daily with breakfast. 10/19/17  Yes Kilroy, Luke K, PA-C  Ferrous Sulfate (IRON) 325 (65 Fe) MG TABS Take 325 mg by mouth daily. 11/25/15  Yes Noel, Tiffany S, PA-C    Fluticasone-Salmeterol (ADVAIR) 250-50 MCG/DOSE AEPB Inhale 1 puff into the lungs 2 (two) times daily. Patient taking differently: Inhale 1 puff into the lungs daily as needed (shortness of breath).  06/10/17  Yes Charlott Rakes, MD  gentamicin cream (GARAMYCIN) 0.1 % Apply 1 application topically See admin instructions. Apply to access site nightly 09/11/18  Yes [provider]  Insulin Glargine, 1 Unit Dial, (TOUJEO SOLOSTAR) 300 UNIT/ML SOPN Inject 10 Units into the skin 2 (two) times daily. 01/05/19  Yes Lattie Haw, MD  insulin regular (HUMULIN R) 100 units/mL injection Inject 0.2-0.25 mLs (20-25 Units total) into the skin See admin instructions. Patient taking differently: Inject 7-15 Units into the skin 3 (three) times daily before meals. Per CBG 09/22/17  Yes Philemon Kingdom, MD  isosorbide mononitrate (IMDUR) 30 MG 24 hr tablet Take 0.5 tablets (15 mg total) by mouth daily. 10/19/17 01/10/19 Yes Kilroy, Luke K, PA-C  lidocaine-prilocaine (EMLA) cream Apply 1 application topically See admin instructions. Apply small amount to access site (AVF) 30 minutes before hemodialysis, cover with occlusive dressing (Saran Wrap) if going out to dialysis center 09/15/17  Yes [provider]  linezolid (ZYVOX) 600 MG tablet Take 1 tablet (600 mg total) by mouth 2 (two) times daily for 14 days. 01/05/19 01/19/19 Yes Lattie Haw, MD  lovastatin (MEVACOR) 20 MG tablet Take 20 mg by mouth at bedtime.    Yes [provider]  methocarbamol (ROBAXIN) 500 MG tablet Take 1 tablet (500 mg total) by mouth 2 (two) times daily as needed for muscle spasms. 09/07/17  Yes Patel, Domenick Bookbinder, MD  metoCLOPramide (REGLAN) 5 MG tablet TAKE 1 TABLET BY MOUTH 4 TIMES DAILY BEFORE MEALS AND AT BEDTIME. Patient taking differently: Take 5 mg by mouth 4 (four) times daily -  before meals and at bedtime.  03/18/17  Yes [name], MD  metoprolol succinate (TOPROL-XL) 50 MG 24 hr tablet Take 1 tablet  (50 mg total) by mouth daily. 03/14/18  Yes [name], MD  montelukast (SINGULAIR) 10 MG tablet Take 1 tablet (10 mg total) by mouth at bedtime. Patient taking differently: Take 10 mg by mouth at bedtime as needed (allergies).  04/12/18  Yes Padgett, Rae Halsted, MD  mupirocin cream (BACTROBAN) 2 % Apply 1 application topically 2 (two) times daily as needed. To affected areas and cover with non-stick gauze. Patient taking differently: Apply 1  application topically 2 (two) times daily as needed (for infection. Cover with non-stick gauze.).  11/17/18  Yes Amyot, Nicholes Stairs, NP  nitroGLYCERIN (NITROSTAT) 0.4 MG SL tablet Place 1 tablet (0.4 mg total) under the tongue every 5 (five) minutes as needed for chest pain. 10/19/17  Yes Kilroy, Luke K, PA-C  oxyCODONE 10 MG TABS Take 0.5 tablets (5 mg total) by mouth every 6 (six) hours as needed for up to 5 days for severe pain. 1-2 TABLETS EVERY 6 HOURS FOR PAIN Patient taking differently: Take 5-10 mg by mouth every 6 (six) hours as needed (severe pain).  01/05/19 01/10/19 Yes Lattie Haw, MD  pantoprazole (PROTONIX) 40 MG tablet TAKE 1 TABLET BY MOUTH DAILY. Patient taking differently: Take 40 mg by mouth daily.  02/14/18  Yes [name], MD  polyethylene glycol (MIRALAX / GLYCOLAX) 17 g packet Take 17 g by mouth 2 (two) times daily for 7 days. 01/05/19 01/12/19 Yes Lattie Haw, MD  pregabalin (LYRICA) 75 MG capsule Take 1 capsule (75 mg total) by mouth daily. 09/19/18  Yes Patel, Domenick Bookbinder, MD  senna-docusate (SENOKOT-S) 8.6-50 MG tablet Take 1 tablet by mouth 2 (two) times daily for 7 days. 01/05/19 01/12/19 Yes Lattie Haw, MD  trimethoprim-polymyxin b (POLYTRIM) ophthalmic solution Place 1 drop into both eyes See admin instructions. Instill one drop into both eyes four times daily for 2 days after each monthly eye injection 06/16/17  Yes [provider]    Allergies    Repatha [evolocumab] and Lisinopril  Review of Systems    Review of Systems  Musculoskeletal: Positive for back pain (swelling).  All other systems reviewed and are negative.   Physical Exam Updated Vital Signs BP 117/79   Pulse (!) 112   Temp 98.2 F (36.8 C) (Rectal)   Resp 20   SpO2 95%   Physical Exam Vitals and nursing note reviewed.  Constitutional:      General: She is not in acute distress.    Appearance: She is well-developed.     Comments: NAD.  HENT:     Head: Normocephalic and atraumatic.     Right Ear: External ear normal.     Left Ear: External ear normal.     Nose: Nose normal.  Eyes:     Conjunctiva/sclera: Conjunctivae normal.  Cardiovascular:     Rate and Rhythm: Normal rate and regular rhythm.     Heart sounds: Murmur present.     Comments: Subtle systolic murmur noted. 1+ radial and DP pulses bilaterally. No lower extremity edema. Pulmonary:     Effort: Pulmonary effort is normal.     Breath sounds: Normal breath sounds.     Comments: Speaking in full sentences. No respiratory distress. Upper/middle lobes clear, diminished air sounds in lower lobes difficult exam due to body habitus or patient positioning, she cannot sit up straight on bed Abdominal:     Palpations: Abdomen is soft.     Tenderness: There is no abdominal tenderness.     Comments: Peritoneal access in left mid abdomen, dressing and tape intact and dry. Obese, soft nontender abdomen.  Genitourinary:    Rectum: Tenderness and external hemorrhoid present.       Comments: Several external hemorrhoids noted. No perianal tenderness, edema, erythema firmness or fluctuance or drainage. There is induration, tenderness approximately 4 cm away from posterior 6 o'clock of anal verge along gluteal cleft and medial buttocks. No drainage. No erythema.  Musculoskeletal:  General: No deformity. Normal range of motion.     Cervical back: Normal range of motion and neck supple.     Lumbar back: Tenderness and bony tenderness present.     Comments: CT  spine: No midline or paraspinal muscle tenderness L-spine: Mild lower lumbar midline tenderness. Mild bilateral paraspinal lumbar muscle tenderness. Skin is normal over the spine. Pelvis: Full range of motion of hips bilaterally, patient reports increased pain in tailbone with movement of hips.  Skin:    General: Skin is warm and dry.     Capillary Refill: Capillary refill takes less than 2 seconds.  Neurological:     Mental Status: She is alert and oriented to person, place, and time.  Psychiatric:        Behavior: Behavior normal.        Thought Content: Thought content normal.        Judgment: Judgment normal.     ED Results / Procedures / Treatments   Labs (all labs ordered are listed, but only abnormal results are displayed) Labs Reviewed  COMPREHENSIVE METABOLIC PANEL - Abnormal; Notable for the following components:      Result Value   Chloride 92 (*)    BUN 93 (*)    Creatinine, Ser 11.48 (*)    Albumin 1.7 (*)    Alkaline Phosphatase 349 (*)    GFR calc non Af Amer 3 (*)    GFR calc Af Amer 4 (*)    Anion gap 19 (*)    All other components within normal limits  CBC WITH DIFFERENTIAL/PLATELET - Abnormal; Notable for the following components:   WBC 29.6 (*)    RBC 2.98 (*)    Hemoglobin 8.2 (*)    HCT 26.2 (*)    Platelets 510 (*)    Neutro Abs 26.2 (*)    Monocytes Absolute 1.3 (*)    Abs Immature Granulocytes 0.32 (*)    All other components within normal limits  CULTURE, BLOOD (ROUTINE X 2)  CULTURE, BLOOD (ROUTINE X 2)  PROTIME-INR  LACTIC ACID, PLASMA  CBG MONITORING, ED  TYPE AND SCREEN    EKG None  Radiology No results found.  Procedures Procedures (including critical care time)  Medications Ordered in ED Medications  metoCLOPramide (REGLAN) injection 10 mg (has no administration in time range)  HYDROmorphone (DILAUDID) injection 1 mg (1 mg Intravenous Given 01/10/19 1008)  fentaNYL (SUBLIMAZE) injection 100 mcg (100 mcg Intravenous Given  01/10/19 1123)    ED Course  I have reviewed the triage vital signs and the nursing notes.  Pertinent labs & imaging results that were available during my care of the patient were reviewed by me and considered in my medical decision making (see chart for details).  Clinical Course as of Jan 09 1525  Wed Jan 10, 2019  1136 Temp: 98.9 F (37.2 C) [CG]  1136 Pulse Rate(!): 102 [CG]  1136 BP(!): 145/69 [CG]  1136 Resp: 12 [CG]  1136 WBC(!): 29.6 [CG]  1136 Hemoglobin(!): 8.2 [CG]  1136 Lactic Acid, Venous: 1.1 [CG]  1400 Re-evaluated pt - reports ongoing back. Explained no better options for pain control other than dilaudid, fentanyl, morphine at this time. She expressed understanding. Frustrated regarding delay in MRI ordered at 1000.  RN and myself called MRI, no answer.    [CG]    Clinical Course User Index [CG] Arlean Hopping   MDM Rules/Calculators/A&P  EMR reviewed.  No growth on abscess culture 12/21. MRI showed perisacral fluid colelction approx 5.2x3.8.8.9 cm, mild increased T2 signal in sacrum/coccyx non specific osteomyelitis vs post traumatic changes. Sigmoidoscopy 12/24 normal other than hemorrhoids.  ER work up personally reviewed. WBC trending down. No leukocytosis.  Stable Hgb since discharge and no longer reporting blood in stools.    Given increased pain, tachycardia, in setting of antibiotic compliance concern for worsening infection.   Discussed with radiologist who recommend MR without contrast.   Pain difficult to control in ER, reports pain "unchanged" despite dilaudid, fentanyl.   1435: Attempted to call MRI, not picking up.  Re-evaluated patient apologized about delay.   1524: Significant delay in MRI.  Pt updated. Pt handed off to oncoming ED resident who will f/u on MRI.  Consider admission for clinical decline, fever, worsening infectious process on MRI.  She has been compliant on linezolid, oxycodones.  At this time pt has  no respiratory complaints and does not appear to be hypervolemia,  Consider admission for dialysis if this changes for PD. Pain control has been difficult here.   Final Clinical Impression(s) / ED Diagnoses Final diagnoses:  Abscess of sacrum [facility])    Rx / DC Orders ED Discharge Orders    None       Kinnie Feil, PA-C 01/10/19 1526    Davonna Belling, MD 01/10/19 1537

## 2019-01-11 ENCOUNTER — Observation Stay (HOSPITAL_COMMUNITY): Payer: Medicare Other | Admitting: Anesthesiology

## 2019-01-11 ENCOUNTER — Encounter (HOSPITAL_COMMUNITY)
Admission: EM | Disposition: A | Discharge status: Home Health | Payer: Self-pay | Source: Home / Self Care | Attending: Family Medicine

## 2019-01-11 ENCOUNTER — Encounter (HOSPITAL_COMMUNITY): Payer: Self-pay | Admitting: Family Medicine

## 2019-01-11 DIAGNOSIS — Z86718 Personal history of other venous thrombosis and embolism: Secondary | ICD-10-CM | POA: Diagnosis not present

## 2019-01-11 DIAGNOSIS — E1143 Type 2 diabetes mellitus with diabetic autonomic (poly)neuropathy: Secondary | ICD-10-CM | POA: Diagnosis present

## 2019-01-11 DIAGNOSIS — I2510 Atherosclerotic heart disease of native coronary artery without angina pectoris: Secondary | ICD-10-CM | POA: Diagnosis present

## 2019-01-11 DIAGNOSIS — Z955 Presence of coronary angioplasty implant and graft: Secondary | ICD-10-CM | POA: Diagnosis not present

## 2019-01-11 DIAGNOSIS — Z4932 Encounter for adequacy testing for peritoneal dialysis: Secondary | ICD-10-CM | POA: Diagnosis not present

## 2019-01-11 DIAGNOSIS — Z992 Dependence on renal dialysis: Secondary | ICD-10-CM

## 2019-01-11 DIAGNOSIS — N2589 Other disorders resulting from impaired renal tubular function: Secondary | ICD-10-CM | POA: Diagnosis not present

## 2019-01-11 DIAGNOSIS — D72829 Elevated white blood cell count, unspecified: Secondary | ICD-10-CM | POA: Diagnosis not present

## 2019-01-11 DIAGNOSIS — K644 Residual hemorrhoidal skin tags: Secondary | ICD-10-CM | POA: Diagnosis present

## 2019-01-11 DIAGNOSIS — M4628 Osteomyelitis of vertebra, sacral and sacrococcygeal region: Secondary | ICD-10-CM | POA: Diagnosis not present

## 2019-01-11 DIAGNOSIS — Z87891 Personal history of nicotine dependence: Secondary | ICD-10-CM

## 2019-01-11 DIAGNOSIS — E669 Obesity, unspecified: Secondary | ICD-10-CM | POA: Diagnosis present

## 2019-01-11 DIAGNOSIS — L0231 Cutaneous abscess of buttock: Secondary | ICD-10-CM | POA: Diagnosis present

## 2019-01-11 DIAGNOSIS — I5032 Chronic diastolic (congestive) heart failure: Secondary | ICD-10-CM | POA: Diagnosis present

## 2019-01-11 DIAGNOSIS — Z9049 Acquired absence of other specified parts of digestive tract: Secondary | ICD-10-CM | POA: Diagnosis not present

## 2019-01-11 DIAGNOSIS — Z20822 Contact with and (suspected) exposure to covid-19: Secondary | ICD-10-CM | POA: Diagnosis present

## 2019-01-11 DIAGNOSIS — Z79899 Other long term (current) drug therapy: Secondary | ICD-10-CM | POA: Diagnosis not present

## 2019-01-11 DIAGNOSIS — I679 Cerebrovascular disease, unspecified: Secondary | ICD-10-CM | POA: Diagnosis not present

## 2019-01-11 DIAGNOSIS — J4540 Moderate persistent asthma, uncomplicated: Secondary | ICD-10-CM | POA: Diagnosis present

## 2019-01-11 DIAGNOSIS — K219 Gastro-esophageal reflux disease without esophagitis: Secondary | ICD-10-CM | POA: Diagnosis present

## 2019-01-11 DIAGNOSIS — E1151 Type 2 diabetes mellitus with diabetic peripheral angiopathy without gangrene: Secondary | ICD-10-CM | POA: Diagnosis present

## 2019-01-11 DIAGNOSIS — K3184 Gastroparesis: Secondary | ICD-10-CM | POA: Diagnosis present

## 2019-01-11 DIAGNOSIS — E1142 Type 2 diabetes mellitus with diabetic polyneuropathy: Secondary | ICD-10-CM | POA: Diagnosis present

## 2019-01-11 DIAGNOSIS — N2581 Secondary hyperparathyroidism of renal origin: Secondary | ICD-10-CM | POA: Diagnosis not present

## 2019-01-11 DIAGNOSIS — I120 Hypertensive chronic kidney disease with stage 5 chronic kidney disease or end stage renal disease: Secondary | ICD-10-CM | POA: Diagnosis not present

## 2019-01-11 DIAGNOSIS — D631 Anemia in chronic kidney disease: Secondary | ICD-10-CM | POA: Diagnosis not present

## 2019-01-11 DIAGNOSIS — E785 Hyperlipidemia, unspecified: Secondary | ICD-10-CM | POA: Diagnosis present

## 2019-01-11 DIAGNOSIS — Z888 Allergy status to other drugs, medicaments and biological substances status: Secondary | ICD-10-CM

## 2019-01-11 DIAGNOSIS — K769 Liver disease, unspecified: Secondary | ICD-10-CM | POA: Diagnosis not present

## 2019-01-11 DIAGNOSIS — I132 Hypertensive heart and chronic kidney disease with heart failure and with stage 5 chronic kidney disease, or end stage renal disease: Secondary | ICD-10-CM | POA: Diagnosis present

## 2019-01-11 DIAGNOSIS — D509 Iron deficiency anemia, unspecified: Secondary | ICD-10-CM | POA: Diagnosis not present

## 2019-01-11 DIAGNOSIS — E440 Moderate protein-calorie malnutrition: Secondary | ICD-10-CM | POA: Diagnosis not present

## 2019-01-11 DIAGNOSIS — Z6828 Body mass index (BMI) 28.0-28.9, adult: Secondary | ICD-10-CM | POA: Diagnosis not present

## 2019-01-11 DIAGNOSIS — N186 End stage renal disease: Secondary | ICD-10-CM | POA: Diagnosis present

## 2019-01-11 DIAGNOSIS — E1122 Type 2 diabetes mellitus with diabetic chronic kidney disease: Secondary | ICD-10-CM | POA: Diagnosis present

## 2019-01-11 DIAGNOSIS — N250 Renal osteodystrophy: Secondary | ICD-10-CM | POA: Diagnosis not present

## 2019-01-11 DIAGNOSIS — R531 Weakness: Secondary | ICD-10-CM | POA: Diagnosis not present

## 2019-01-11 DIAGNOSIS — G252 Other specified forms of tremor: Secondary | ICD-10-CM | POA: Diagnosis not present

## 2019-01-11 HISTORY — PX: INCISION AND DRAINAGE ABSCESS: SHX5864

## 2019-01-11 LAB — CBC
HCT: 26.1 % — ABNORMAL LOW (ref 36.0–46.0)
Hemoglobin: 8.3 g/dL — ABNORMAL LOW (ref 12.0–15.0)
MCH: 27.9 pg (ref 26.0–34.0)
MCHC: 31.8 g/dL (ref 30.0–36.0)
MCV: 87.6 fL (ref 80.0–100.0)
Platelets: 461 10*3/uL — ABNORMAL HIGH (ref 150–400)
RBC: 2.98 MIL/uL — ABNORMAL LOW (ref 3.87–5.11)
RDW: 15.1 % (ref 11.5–15.5)
WBC: 26.9 10*3/uL — ABNORMAL HIGH (ref 4.0–10.5)
nRBC: 0 % (ref 0.0–0.2)

## 2019-01-11 LAB — BASIC METABOLIC PANEL
Anion gap: 21 — ABNORMAL HIGH (ref 5–15)
BUN: 105 mg/dL — ABNORMAL HIGH (ref 6–20)
CO2: 21 mmol/L — ABNORMAL LOW (ref 22–32)
Calcium: 8.3 mg/dL — ABNORMAL LOW (ref 8.9–10.3)
Chloride: 95 mmol/L — ABNORMAL LOW (ref 98–111)
Creatinine, Ser: 11.85 mg/dL — ABNORMAL HIGH (ref 0.44–1.00)
GFR calc Af Amer: 4 mL/min — ABNORMAL LOW (ref >60)
GFR calc non Af Amer: 3 mL/min — ABNORMAL LOW (ref >60)
Glucose, Bld: 95 mg/dL (ref 70–99)
Potassium: 4.4 mmol/L (ref 3.5–5.1)
Sodium: 137 mmol/L (ref 135–145)

## 2019-01-11 LAB — GLUCOSE, CAPILLARY
Glucose-Capillary: 86 mg/dL (ref 70–99)
Glucose-Capillary: 89 mg/dL (ref 70–99)
Glucose-Capillary: 76 mg/dL (ref 70–99)
Glucose-Capillary: 94 mg/dL (ref 70–99)
Glucose-Capillary: 133 mg/dL — ABNORMAL HIGH (ref 70–99)
Glucose-Capillary: 249 mg/dL — ABNORMAL HIGH (ref 70–99)

## 2019-01-11 SURGERY — INCISION AND DRAINAGE, ABSCESS
Anesthesia: General | Site: Buttocks

## 2019-01-11 MED ORDER — MIDAZOLAM HCL 2 MG/2ML IJ SOLN
INTRAMUSCULAR | Status: AC
  Filled 2019-01-11: qty 2

## 2019-01-11 MED ORDER — BUPIVACAINE HCL (PF) 0.25 % IJ SOLN
INTRAMUSCULAR | Status: AC
  Filled 2019-01-11: qty 30

## 2019-01-11 MED ORDER — MORPHINE SULFATE (PF) 4 MG/ML IV SOLN: 4.0000 mg | INTRAVENOUS | Status: DC | PRN

## 2019-01-11 MED ORDER — OXYCODONE HCL 5 MG PO TABS
ORAL_TABLET | ORAL | Status: AC
  Filled 2019-01-11: qty 1

## 2019-01-11 MED ORDER — STERILE WATER FOR IRRIGATION IR SOLN
Status: DC | PRN
  Administered 2019-01-11: 1000 mL

## 2019-01-11 MED ORDER — DEXAMETHASONE SODIUM PHOSPHATE 10 MG/ML IJ SOLN
INTRAMUSCULAR | Status: DC | PRN
  Administered 2019-01-11: 5 mg via INTRAVENOUS

## 2019-01-11 MED ORDER — PROMETHAZINE HCL 25 MG/ML IJ SOLN: 6.2500 mg | INTRAMUSCULAR | Status: DC | PRN

## 2019-01-11 MED ORDER — DELFLEX-LC/1.5% DEXTROSE 344 MOSM/L IP SOLN: Freq: Four times a day (QID) | INTRAPERITONEAL | Status: DC

## 2019-01-11 MED ORDER — LIDOCAINE 2% (20 MG/ML) 5 ML SYRINGE
INTRAMUSCULAR | Status: DC | PRN
  Administered 2019-01-11: 100 mg via INTRAVENOUS

## 2019-01-11 MED ORDER — SUCCINYLCHOLINE CHLORIDE 20 MG/ML IJ SOLN
INTRAMUSCULAR | Status: DC | PRN
  Administered 2019-01-11: 100 mg via INTRAVENOUS

## 2019-01-11 MED ORDER — PROPOFOL 10 MG/ML IV BOLUS
INTRAVENOUS | Status: AC
  Filled 2019-01-11: qty 20

## 2019-01-11 MED ORDER — SODIUM CHLORIDE 0.9 % IV SOLN: INTRAVENOUS | Status: DC

## 2019-01-11 MED ORDER — DELFLEX-LM/1.5% DEXTROSE 346 MOSM/L IP SOLN: Freq: Once | INTRAPERITONEAL | Status: DC

## 2019-01-11 MED ORDER — 0.9 % SODIUM CHLORIDE (POUR BTL) OPTIME
TOPICAL | Status: DC | PRN
  Administered 2019-01-11: 1000 mL

## 2019-01-11 MED ORDER — PROPOFOL 10 MG/ML IV BOLUS
INTRAVENOUS | Status: DC | PRN
  Administered 2019-01-11: 150 mg via INTRAVENOUS

## 2019-01-11 MED ORDER — PHENYLEPHRINE 40 MCG/ML (10ML) SYRINGE FOR IV PUSH (FOR BLOOD PRESSURE SUPPORT)
PREFILLED_SYRINGE | INTRAVENOUS | Status: DC | PRN
  Administered 2019-01-11: 80 ug via INTRAVENOUS

## 2019-01-11 MED ORDER — WHITE PETROLATUM EX OINT
TOPICAL_OINTMENT | CUTANEOUS | Status: AC
  Filled 2019-01-11: qty 28.35

## 2019-01-11 MED ORDER — HEPARIN 1000 UNIT/ML FOR PERITONEAL DIALYSIS
2500.0000 [IU] | INTRAMUSCULAR | Status: DC | PRN
  Filled 2019-01-11: qty 2.5

## 2019-01-11 MED ORDER — FLUCONAZOLE 150 MG PO TABS
150.0000 mg | ORAL_TABLET | Freq: Once | ORAL | Status: AC
  Administered 2019-01-11: 150 mg via ORAL
  Filled 2019-01-11: qty 1

## 2019-01-11 MED ORDER — FENTANYL CITRATE (PF) 250 MCG/5ML IJ SOLN
INTRAMUSCULAR | Status: AC
  Filled 2019-01-11: qty 5

## 2019-01-11 MED ORDER — HYDROMORPHONE HCL 1 MG/ML IJ SOLN
INTRAMUSCULAR | Status: AC
  Filled 2019-01-11: qty 1

## 2019-01-11 MED ORDER — FENTANYL CITRATE (PF) 250 MCG/5ML IJ SOLN
INTRAMUSCULAR | Status: DC | PRN
  Administered 2019-01-11: 75 ug via INTRAVENOUS

## 2019-01-11 MED ORDER — ONDANSETRON HCL 4 MG/2ML IJ SOLN
INTRAMUSCULAR | Status: DC | PRN
  Administered 2019-01-11: 4 mg via INTRAVENOUS

## 2019-01-11 MED ORDER — HYDROMORPHONE HCL 1 MG/ML IJ SOLN
0.2500 mg | INTRAMUSCULAR | Status: DC | PRN
  Administered 2019-01-11: 10:00:00 0.5 mg via INTRAVENOUS

## 2019-01-11 MED ORDER — MIDAZOLAM HCL 2 MG/2ML IJ SOLN
INTRAMUSCULAR | Status: DC | PRN
  Administered 2019-01-11: 1 mg via INTRAVENOUS

## 2019-01-11 SURGICAL SUPPLY — 29 items
BNDG GAUZE ELAST 4 BULKY (GAUZE/BANDAGES/DRESSINGS) IMPLANT
CANISTER SUCT 3000ML PPV (MISCELLANEOUS) ×2 IMPLANT
COVER SURGICAL LIGHT HANDLE (MISCELLANEOUS) ×2 IMPLANT
COVER WAND RF STERILE (DRAPES) ×2 IMPLANT
DRAPE LAPAROSCOPIC ABDOMINAL (DRAPES) ×2 IMPLANT
DRAPE UTILITY XL STRL (DRAPES) ×2 IMPLANT
DRSG PAD ABDOMINAL 8X10 ST (GAUZE/BANDAGES/DRESSINGS) ×1 IMPLANT
ELECT CAUTERY BLADE 6.4 (BLADE) ×2 IMPLANT
ELECT REM PT RETURN 9FT ADLT (ELECTROSURGICAL) ×2
ELECTRODE REM PT RTRN 9FT ADLT (ELECTROSURGICAL) ×1 IMPLANT
GAUZE PACKING IODOFORM 1X5 (MISCELLANEOUS) ×1 IMPLANT
GAUZE SPONGE 4X4 12PLY STRL (GAUZE/BANDAGES/DRESSINGS) ×1 IMPLANT
GLOVE BIO SURGEON STRL SZ8 (GLOVE) ×2 IMPLANT
GLOVE BIOGEL PI IND STRL 8 (GLOVE) ×1 IMPLANT
GLOVE BIOGEL PI INDICATOR 8 (GLOVE) ×1
GOWN STRL REUS W/ TWL LRG LVL3 (GOWN DISPOSABLE) ×1 IMPLANT
GOWN STRL REUS W/ TWL XL LVL3 (GOWN DISPOSABLE) ×1 IMPLANT
GOWN STRL REUS W/TWL LRG LVL3 (GOWN DISPOSABLE) ×2
GOWN STRL REUS W/TWL XL LVL3 (GOWN DISPOSABLE) ×2
KIT BASIN OR (CUSTOM PROCEDURE TRAY) ×2 IMPLANT
KIT TURNOVER KIT B (KITS) ×2 IMPLANT
NS IRRIG 1000ML POUR BTL (IV SOLUTION) ×2 IMPLANT
PACK GENERAL/GYN (CUSTOM PROCEDURE TRAY) ×2 IMPLANT
PAD ARMBOARD 7.5X6 YLW CONV (MISCELLANEOUS) ×2 IMPLANT
PENCIL SMOKE EVACUATOR (MISCELLANEOUS) ×2 IMPLANT
SWAB COLLECTION DEVICE MRSA (MISCELLANEOUS) IMPLANT
SWAB CULTURE ESWAB REG 1ML (MISCELLANEOUS) IMPLANT
TOWEL GREEN STERILE (TOWEL DISPOSABLE) ×2 IMPLANT
TOWEL GREEN STERILE FF (TOWEL DISPOSABLE) ×2 IMPLANT

## 2019-01-11 NOTE — Progress Notes (Signed)
New Admission Note:   Arrival Method: stretcher from ED Mental Orientation: A&O x4 Telemetry: N/A Assessment: to be completed Skin: Intact, refer to flowsheet IV: RAC, Infusing Pain: 9/10 , PRN med given Tubes: PD catheter LLQ Safety Measures: Safety Fall Prevention Plan has been discussed  Admission: to be completed 5 [place] Orientation: Patient has been orientated to the room, unit and staff.   Family: none at bedside  Orders to be reviewed and implemented. Will continue to monitor the patient. Call light has been placed within reach and bed alarm has been activated.

## 2019-01-11 NOTE — Anesthesia Postprocedure Evaluation (Signed)
Anesthesia Post Note  Patient: Susan Horton  Procedure(s) Performed: INCISION AND DRAINAGE SACRAL ABSCESS (N/A Buttocks)     Patient location during evaluation: PACU Anesthesia Type: General Level of consciousness: awake and alert Pain management: pain level controlled Vital Signs Assessment: post-procedure vital signs reviewed and stable Respiratory status: spontaneous breathing, nonlabored ventilation, respiratory function stable and patient connected to nasal cannula oxygen Cardiovascular status: blood pressure returned to baseline and stable Postop Assessment: no apparent nausea or vomiting Anesthetic complications: no    Last Vitals:  Vitals:   01/11/19 1000 01/11/19 1015  BP: (!) 149/80 (!) 146/88  Pulse: 86 92  Resp: 16 19  Temp:    SpO2: 99% 100%    Last Pain:  Vitals:   01/11/19 1013  TempSrc:   PainSc: 9                  Jemia Fata S

## 2019-01-11 NOTE — Progress Notes (Addendum)
Family Medicine Teaching Service Daily Progress Note Intern Pager: [phone]  Patient name: Susan Horton Medical record number: [id number] Date of birth: November 20, 1970 Age: 48 y.o. Gender: female  Primary Care Provider: Charlott Rakes, MD Consultants: nephrology, ID, gen surgery  Code Status: Full  Pt Overview and Major Events to Date:  Admitted to [facility] 12/31  Assessment and Plan: Susan Horton is a 48 y.o. female presenting with pain in her buttock . PMH is significant for ESRD on home PD, T2DM, HTN, CHF, PVD  Buttock pain 2/2 sacral abscess with h/o recent sacral phlegmon VSS and remains afebrile. WBC improving to 26.9. using dilaudid q6h. Gen surgery consulted, recommended likely I&D in the AM. Patient NPO and plavix/ASA held. Per ID (Dr. Tommy Medal) on admit, patient should not continue abx, will need gen surg consult, and ID will see in AM. Per OP note patient had large amount of purulent material evacuated and cultures were sent.  -follow blood cultures -will hold linezolid (12/18-) per ID recs.  -ID consulted, appreciate recommendations. Confirmed with Dr. Tommy Medal, he will see patient today  -surgery consulted, appreciate recommendations. Likely I&D  -scheduled tylenol q6h -pain control per surgery: oxycodone 5mg  q6h PRN -vitals per routine   Leukocytosis Improving. WBC 29.6>26.9. Patient to f/u with hematology as outpt for bone marrow bx. Patient   -outpatient f/u with hematology   Anemia Hgb stable at 8.3.  -continue to monitor   ESRD on home PD Uses home nightly peritoneal dialysis.  Home medications include PhosLo, calcitriol. -Nephrology consulted, appreciate recommendations -Continue home medications  CAD Follows with [facility].  Home meds include aspirin, Plavix, metoprolol.  Patient also with history of drug-eluting stent placed in 2018.  -hold ASA and plavix for possible procedure -per gen surg, ok to restart ASA and plavix on 1/1  PAD History  of significant peripheral artery disease and status post right SFA intervention by Dr. Carl[facility] Abbott on 212/3/20.  Home meds include Plavix, aspirin, lovastatin. -hold asa and plavix for possible procedure  -per gen surg, ok to restart ASA and plavix on 1/1  HFpEF Also addressed by cardiology.  Recent echo on 12/11 showing EF 60 to 65% with normal left ventricular function.  Echo from 2019 showing EF 55 to 60% with G2DD.  Her medications include metoprolol & IMDUR -continue home meds   T2DM with peripheral neuropathy   AM CBG 89. Home meds include Toujeo 10U bid units as well as Humalog sliding scale insulin. -sSSI -monitor CBGs q4h  -continue amitriptyline   Gastroparesis  H/o gastroparesis with frequent vomiting. Home meds: reglan 5mg  tid  -continue home meds  HTN Remains hypertensive at 155/56. Home meds: toprol 50mg  daily  -continue home meds   Asthma Meds: Singulair as needed, Advair twice daily and Ventolin as needed. -continue home meds   FEN/GI: renal/carb modified    Prophylaxis: SCDs, restart pharm VTE ppx on 1/1   Disposition: continued inpatient stay   Subjective:  Patient today reports improvement. Was sitting at side of bed on entry to room.  Was able to eat but did have episode of emesis secondary to her gastroparesis.  Objective: Temp:  [98.2 F (36.8 C)-98.8 F (37.1 C)] 98.5 F (36.9 C) (12/31 1040) Pulse Rate:  [66-144] 97 (12/31 1040) Resp:  [15-20] 18 (12/31 1040) BP: (92-176)/(56-97) 133/97 (12/31 1040) SpO2:  [94 %-100 %] 95 % (12/31 1040) Weight:  [78 kg-78.1 kg] 78 kg (12/31 0850) Physical Exam: General: awake and alert, sitting  at side of bed, NAD  Cardiovascular: regular rate  Respiratory: no increased WOB Extremities: no edema Back: large bandage covering buttock, no drainage, no bleeding   Laboratory: Recent Labs  Lab 01/05/19 0454 01/05/19 1520 01/09/19 1922 01/11/19 0552  WBC 37.7*  --  29.6* 26.9*  HGB 7.9* 9.7* 8.2* 8.3*  HCT  24.0* 29.0* 26.2* 26.1*  PLT 468*  --  510* 461*   Recent Labs  Lab 01/05/19 0454 01/09/19 1922 01/11/19 0552  NA 133* 135 137  K 3.5 4.2 4.4  CL 94* 92* 95*  CO2 24 24 21*  BUN 54* 93* 105*  CREATININE 7.99* 11.48* 11.85*  CALCIUM 8.6* 9.1 8.3*  PROT  --  6.7  --   BILITOT  --  0.5  --   ALKPHOS  --  349*  --   ALT  --  20  --   AST  --  20  --   GLUCOSE 108* 93 95     Imaging/Diagnostic Tests: No new images   Caroline More, DO 01/11/2019, 12:06 PM PGY-3, [place] [facility] Intern pager: [phone], text pages welcome

## 2019-01-11 NOTE — Evaluation (Signed)
Physical Therapy Evaluation Patient Details Name: Susan Horton MRN: [id number] DOB: Apr 14, 1970 Today's Date: 01/11/2019   History of Present Illness  48 y/o female who presents with Pain in her lower back, was found to have sacral abscess and received I and D on 12/31.  PMHx:  leukocytosis, N&V, gastroparesis, DM, PD from ESRD, CHF, PVD, HTN, PAD with R SAF 12/14/18, anemia, PN, SOB, LLE pain and spasms  Clinical Impression  Pt was seen for mobility with RW and noted spasms on lateral L thigh with movement to sit on side of bed along with less strength on LLE as compared to RLE.  Pt is walking but will need support and supervision at home, which her family plans to provide.  If family cannot be supportive would recommend she change to an inpt setting for further therapy.  HHPT for home is planned for now.    Follow Up Recommendations Home health PT;Supervision for mobility/OOB    Equipment Recommendations  Rolling walker with 5" wheels;Other (comment)(pt is asking about power scooter)    Recommendations for Other Services       Precautions / Restrictions Precautions Precautions: Fall Precaution Comments: history of falls Restrictions Weight Bearing Restrictions: No      Mobility  Bed Mobility Overal bed mobility: Needs Assistance Bed Mobility: Supine to Sit;Sit to Supine   Sidelying to sit: Min assist Supine to sit: Min assist Sit to supine: Min assist   General bed mobility comments: min assist for legs but pt manages trunk with HOB elevated and bedrail  Transfers Overall transfer level: Needs assistance Equipment used: Rolling walker (2 wheeled);1 person hand held assist Transfers: Sit to/from Stand Sit to Stand: Min assist         General transfer comment: min assist to power up  Ambulation/Gait Ambulation/Gait assistance: Min guard Gait Distance (Feet): 80 Feet Assistive device: Rolling walker (2 wheeled) Gait Pattern/deviations: Step-through pattern;Decreased  stride length;Wide base of support;Trunk flexed Gait velocity: decreased Gait velocity interpretation: <1.31 ft/sec, indicative of household ambulator General Gait Details: LLE feels weak to pt and requires extra time and care for safety  Stairs            Wheelchair Mobility    Modified Rankin (Stroke Patients Only)       Balance Overall balance assessment: History of Falls;Needs assistance Sitting-balance support: Feet supported Sitting balance-Leahy Scale: Good     Standing balance support: Bilateral upper extremity supported;During functional activity Standing balance-Leahy Scale: Poor Standing balance comment: walker is relied upon by pt to control standing                             Pertinent Vitals/Pain Pain Assessment: 0-10 Pain Score: 7  Pain Location: sacral surgery site Pain Descriptors / Indicators: Guarding Pain Intervention(s): Limited activity within patient's tolerance;Monitored during session;Premedicated before session;Repositioned;Patient requesting pain meds-RN notified    Home Living Family/patient expects to be discharged to:: Private residence Living Arrangements: Alone Available Help at Discharge: Family;Friend(s);Available PRN/intermittently Type of Home: House Home Access: Stairs to enter Entrance Stairs-Rails: Right Entrance Stairs-Number of Steps: 3 Home Layout: One level Home Equipment: Walker - 4 wheels;Cane - single point Additional Comments: pt reports she has people in the house 90% of the time    Prior Function Level of Independence: Independent with assistive device(s)         Comments: has her sister's help the most, sister has worked in rehab setting  Hand Dominance   Dominant Hand: Right    Extremity/Trunk Assessment   Upper Extremity Assessment Upper Extremity Assessment: Overall WFL for tasks assessed    Lower Extremity Assessment Lower Extremity Assessment: Generalized weakness    Cervical  / Trunk Assessment Cervical / Trunk Assessment: Other exceptions(has sacral abscess with spasms LLE)  Communication   Communication: No difficulties  Cognition Arousal/Alertness: Awake/alert Behavior During Therapy: WFL for tasks assessed/performed Overall Cognitive Status: Within Functional Limits for tasks assessed                                        General Comments General comments (skin integrity, edema, etc.): pt is SOB with the effort to walk per her report, asked about getting a power device for longer trips out of the house    Exercises     Assessment/Plan    PT Assessment Patient needs continued PT services  PT Problem List Decreased strength;Decreased range of motion;Decreased activity tolerance;Decreased balance;Decreased mobility;Decreased coordination;Decreased knowledge of use of DME;Decreased safety awareness;Cardiopulmonary status limiting activity;Decreased skin integrity;Pain       PT Treatment Interventions DME instruction;Gait training;Stair training;Functional mobility training;Therapeutic activities;Therapeutic exercise;Balance training;Neuromuscular re-education;Patient/family education    PT Goals (Current goals can be found in the Care Plan section)  Acute Rehab PT Goals Patient Stated Goal: to feel stronger esp LLE PT Goal Formulation: With patient Time For Goal Achievement: 01/25/19 Potential to Achieve Goals: Good    Frequency Min 3X/week   Barriers to discharge Inaccessible home environment;Decreased caregiver support home with limited help and stairs to enter    Co-evaluation               AM-PAC PT "6 Clicks" Mobility  Outcome Measure Help needed turning from your back to your side while in a flat bed without using bedrails?: A Little Help needed moving from lying on your back to sitting on the side of a flat bed without using bedrails?: A Little Help needed moving to and from a bed to a chair (including a  wheelchair)?: A Little Help needed standing up from a chair using your arms (e.g., wheelchair or bedside chair)?: A Little Help needed to walk in hospital room?: A Little Help needed climbing 3-5 steps with a railing? : A Lot 6 Click Score: 17    End of Session Equipment Utilized During Treatment: Gait belt Activity Tolerance: Patient limited by fatigue;Treatment limited secondary to medical complications (Comment) Patient left: in bed;with call bell/phone within reach;with family/visitor present Nurse Communication: Mobility status PT Visit Diagnosis: Unsteadiness on feet (R26.81);Muscle weakness (generalized) (M62.81);Difficulty in walking, not elsewhere classified (R26.2);Pain Pain - Right/Left: (sacral area) Pain - part of body: (sacral area)    Time: 5621-3086 PT Time Calculation (min) (ACUTE ONLY): 38 min   Charges:   PT Evaluation $PT Eval Moderate Complexity: 1 Mod PT Treatments $Gait Training: 8-22 mins $Therapeutic Exercise: 8-22 mins       Ramond Dial 01/11/2019, 1:17 PM   Mee Hives, PT MS Acute Rehab Dept. Number: [place] [facility] [place]

## 2019-01-11 NOTE — Progress Notes (Signed)
Subjective:  Back from surgery  Feels better/ plans for CAPD as ccpd machines not available   Objective Vital signs in last 24 hours: Vitals:   01/11/19 1025 01/11/19 1028 01/11/19 1030 01/11/19 1040  BP:   92/75 (!) 133/97  Pulse: 90 88 87 97  Resp: 15 20 16 18   Temp:   98.4 F (36.9 C) 98.5 F (36.9 C)  TempSrc:    Oral  SpO2: 95% 96% 97% 95%  Weight:      Height:       Weight change:   Physical Exam: General: alert Female NAD Heart: RRR, nom,r, g  Lungs: CTA Abdomen: BS pos , soft , NT, ND, PD cath in palce Extremities: no pedal edema,  Dialysis Access: LUA AVF , pos bruit ,pd cath   Problem/Plan: 1.Sacral, coccygeal and gluteal abscesses:Status post aspiration of perisacral fluid collection on 12/21 with negative Gram stain and cultures. THIS AM  Dr Lavone Neri to surgery for ID SACRAL ABSCESS / on  antibiotic therapy with linezolid (narrowed down from aztreonam/vancomycin/Flagyl). 2. ESRD:on PD followed by Dr. Jimmy Footman CCPD 4 exchanges with 2L at night 2 1.5% and 1 2.5% with 5th fill of 1L which she drains at midday.  Continue peritoneal dialysis no ccpd machines available so capd  And   no evidence of PD peritonitis based on exam. Previous imaging does not show communication of abscess with peritoneum. Will continue PD prescription; no avail HD tonight sec emergent cases  .  K 4.4 bun 105/ cr 11.85   3.Hypertension/ Volume = no excess vol on exam / bp mildly up  Use 2.5 % pd bags  4. Anemia:With some downtrending hemoglobin/hematocrit noted on this morning's labs with ongoing PRBC transfusion when seen. 5. CKD-MBD:Calcium level currently at goal with rising phosphorus level.6.8  continue phos binders  6. Nutrition:alb 1.7 Continue current diet with ONS/protein supplementation to optimize wound healing/PD losses. She has chronic gastroparesis on metoclopramide that further compromise her nutritional status.. 7. CHF: compensated 8. DM per admit   Ernest Haber,  PA-C [facility] Beeper [phone] 01/11/2019,12:42 PM  LOS: 0 days   Labs: Basic Metabolic Panel: Recent Labs  Lab 01/05/19 0454 01/09/19 1922 01/11/19 0552  NA 133* 135 137  K 3.5 4.2 4.4  CL 94* 92* 95*  CO2 24 24 21*  GLUCOSE 108* 93 95  BUN 54* 93* 105*  CREATININE 7.99* 11.48* 11.85*  CALCIUM 8.6* 9.1 8.3*  PHOS 6.8*  --   --    Liver Function Tests: Recent Labs  Lab 01/05/19 0454 01/09/19 1922  AST  --  20  ALT  --  20  ALKPHOS  --  349*  BILITOT  --  0.5  PROT  --  6.7  ALBUMIN 1.3* 1.7*   No results for input(s): LIPASE, AMYLASE in the last 168 hours. No results for input(s): AMMONIA in the last 168 hours. CBC: Recent Labs  Lab 01/04/19 1907 01/05/19 0454 01/05/19 1520 01/09/19 1922 01/11/19 0552  WBC 41.3* 37.7*  --  29.6* 26.9*  NEUTROABS  --   --   --  26.2*  --   HGB 8.4* 7.9* 9.7* 8.2* 8.3*  HCT 25.4* 24.0* 29.0* 26.2* 26.1*  MCV 83.3 82.8  --  87.9 87.6  PLT 475* 468*  --  510* 461*   Cardiac Enzymes: No results for input(s): CKTOTAL, CKMB, CKMBINDEX, TROPONINI in the last 168 hours. CBG: Recent Labs  Lab 01/10/19 2259 01/11/19 0350 01/11/19 0746 01/11/19 0957 01/11/19  [place]    Studies/Results: MR PELVIS WO CONTRAST  Result Date: 01/10/2019 CLINICAL DATA:  Presacral abscess drainage January 01, 2019. Pain in the pelvis. History of perirectal abscess. EXAM: MRI PELVIS WITHOUT CONTRAST TECHNIQUE: Multiplanar multisequence MR imaging of the pelvis was performed. No intravenous contrast was administered. COMPARISON:  12/31/2018 FINDINGS: Nonstandard positioning was employed due to the patient's pain. Urinary Tract:  Urinary bladder unremarkable. Bowel:  No dilated bowel in the pelvis. Vascular/Lymphatic: No pathologic regional adenopathy observed. Reproductive:  Prior hysterectomy. Adnexa unremarkable. Other: Moderate pelvic ascites, new compared to the 12/31/2018 exam. Small amount of ascites along  the right paracolic gutter. Musculoskeletal: Extending under the tip of the coccyx but muscle along the posterior superficial margin of the coccyx and lower sacrum, we demonstrate a 7.0 by 4.9 by 8.3 cm (volume = 150 cm^3) abnormal fluid collection compatible with abscess. This has increased in volume compared to previous and likely represents some reaccumulation or reforming of the abscess. This partially extends along the medial margins of the gluteus maximus muscles, which demonstrate adjacent edema compatible with myositis. There is likely some myositis involving the superficial posterior portions of the spinalis thoracis muscles behind the sacrum. There is only a small amount of presacral edema, and no drainable collection anterior to the sacrum at this time. Much of the current abscess is very superficial within the subcutaneous tissues along the posteroinferior margin of the intergluteal cleft. There is subcutaneous edema along the lower anterior abdominal wall, and tracking anteriorly in the right thigh. IMPRESSION: 1. Interval increase in size of the abscess along the posterior superficial margin of the coccyx and lower sacrum, now measuring 150 cubic cm. 2. Moderate pelvic ascites, new compared to 12/20/20202020 exam. 3. There is some myositis involving the superficial posterior portions of the spinalis thoracis muscles behind the sacrum, and of the medial gluteus maximus muscles adjacent to the abscess. 4. There is some subcutaneous edema along the lower anterior abdominal wall and tracking anteriorly in the right thigh. Electronically Signed   By: Van Clines M.D.   On: 01/10/2019 18:34   Medications: . sodium chloride 10 mL/hr at 01/11/19 0859  . dialysis solution 1.5% low-MG/low-CA     . acetaminophen  650 mg Oral Q6H   Or  . acetaminophen  650 mg Rectal Q6H  . amitriptyline  10 mg Oral QHS  . calcitRIOL  1.5 mcg Oral Daily  . calcium acetate  1,334 mg Oral With snacks  . calcium  acetate  2,001 mg Oral TID WC  . ferrous sulfate  325 mg Oral Daily  . fluconazole  150 mg Oral Once  . fluticasone furoate-vilanterol  1 puff Inhalation Daily  . gentamicin cream  1 application Topical QHS  . HYDROmorphone      . insulin aspart  0-9 Units Subcutaneous Q4H  . isosorbide mononitrate  15 mg Oral Daily  . [START ON 01/12/2019] lidocaine-prilocaine  1 application Topical Q M,W,F-HD  . metoCLOPramide  5 mg Oral TID AC & HS  . metoprolol succinate  50 mg Oral Daily  . montelukast  10 mg Oral QHS  . multivitamin with minerals  1 tablet Oral Daily  . oxyCODONE      . pantoprazole  40 mg Oral Daily  . polyethylene glycol  17 g Oral BID  . pravastatin  40 mg Oral q1800  . pregabalin  75 mg Oral Daily  . senna-docusate  1 tablet Oral BID  .  umeclidinium bromide  1 puff Inhalation Daily  . white petrolatum

## 2019-01-11 NOTE — Op Note (Signed)
  01/11/2019  9:49 AM  PATIENT:  Susan Horton  48 y.o. female  PRE-OPERATIVE DIAGNOSIS:  Sacral Abscess  POST-OPERATIVE DIAGNOSIS:  Sacral Abscess  PROCEDURE:  Procedure(s): INCISION AND DRAINAGE SACRAL ABSCESS  SURGEON:  Surgeon(s): Georganna Skeans, MD  ASSISTANTS: none   ANESTHESIA:   general  EBL:  No intake/output data recorded.  BLOOD ADMINISTERED:none  DRAINS: none   SPECIMEN:  No Specimen  DISPOSITION OF SPECIMEN:  N/A  COUNTS:  YES  DICTATION: .Dragon Dictation Findings: Abscess over the sacrum containing about 20 cc of pus  Procedure in detail: The patient is brought for incision and drainage of sacral abscess.  Informed consent was obtained.  She is on linezolid.  She was brought to the operating room and general anesthesia was administered by the anesthesia staff.  She was placed in a prone position and her sacral area was prepped and draped in sterile fashion.  We did a timeout procedure.  I made a midline incision over the fluctuant area and I entered a abscess cavity using cautery.  There is a large amount of purulent material that I evacuated, about 20 cc.  I sent cultures.  Some small loculations were broken up.  There was a little bit of fibrinous tissue present which I debrided.  The wound was thoroughly irrigated.  Hemostasis was obtained.  I then packed it with 1 inch iodoform covered with bulky sterile gauze.  All counts were correct.  She tolerated procedure well without apparent complication and was taken recovery in stable condition. PATIENT DISPOSITION:  PACU - hemodynamically stable.   Delay start of Pharmacological VTE agent (>24hrs) due to surgical blood loss or risk of bleeding:  no  Georganna Skeans, MD, MPH, FACS Pager: [phone]  12/31/20209:49 AM

## 2019-01-11 NOTE — Evaluation (Signed)
Occupational Therapy Evaluation Patient Details Name: Susan Horton MRN: [id number] DOB: January 28, 1970 Today's Date: 01/11/2019    History of Present Illness 48 y/o female who presents with Pain in her lower back, was found to have sacral abscess and received I and D on 12/31.  PMHx:  leukocytosis, N&V, gastroparesis, DM, PD from ESRD, CHF, PVD, HTN, PAD with R SAF 12/14/18, anemia, PN, SOB, LLE pain and spasms   Clinical Impression   Pt PTA: Pt living with family and sister assisting with ADL and mobility short distances in home. Pt currently minA for mobility with RW a short distance; minA overall for ADL due to pain in sacral site and weakness. Pt performing bathing at EOB with minA overall for UB. Pt transfers for sit to stand with minguardA overall. Pt does not require continued acute OT as pt at her functional baseline. Pt would benefit from continued OT in [facility] setting, but pt reports that her sister assists with all self care aspects. OT signing off.    Follow Up Recommendations  Home health OT(If pt feels that she needs it)    Equipment Recommendations  None recommended by OT    Recommendations for Other Services       Precautions / Restrictions Precautions Precautions: Fall Precaution Comments: history of falls Restrictions Weight Bearing Restrictions: No      Mobility Bed Mobility Overal bed mobility: Needs Assistance Bed Mobility: Supine to Sit;Sit to Supine   Sidelying to sit: Supervision Supine to sit: Supervision Sit to supine: Min assist   General bed mobility comments: Supervision with use of rail; getting into bed minA for LB  Transfers Overall transfer level: Needs assistance Equipment used: Rolling walker (2 wheeled);1 person hand held assist Transfers: Sit to/from Stand Sit to Stand: Min assist         General transfer comment: min assist to power up    Balance Overall balance assessment: History of Falls;Needs assistance Sitting-balance support:  Feet supported Sitting balance-Leahy Scale: Good     Standing balance support: Bilateral upper extremity supported;During functional activity Standing balance-Leahy Scale: Poor Standing balance comment: reliance on RW                           ADL either performed or assessed with clinical judgement   ADL Overall ADL's : At baseline                                       General ADL Comments: Pt with minA overall for ADL. Pt able to wash front and back for the most part and lotioned most of UB. Pt requires assist for LB due to inability to sit for long due to sacral site. Pt insistent that her sister will assist at home. "We help each other.     Vision Baseline Vision/History: No visual deficits       Perception     Praxis      Pertinent Vitals/Pain Pain Assessment: 0-10 Pain Score: 5  Pain Location: sacral surgery site Pain Descriptors / Indicators: Guarding Pain Intervention(s): Monitored during session;Repositioned     Hand Dominance Right   Extremity/Trunk Assessment Upper Extremity Assessment Upper Extremity Assessment: Overall WFL for tasks assessed   Lower Extremity Assessment Lower Extremity Assessment: Generalized weakness   Cervical / Trunk Assessment Cervical / Trunk Assessment: Other exceptions(has sacral abscess with spasms LLE) Cervical /  Trunk Exceptions: sarcal abcess   Communication Communication Communication: No difficulties   Cognition Arousal/Alertness: Awake/alert Behavior During Therapy: WFL for tasks assessed/performed Overall Cognitive Status: Within Functional Limits for tasks assessed                                     General Comments  pt is SOB with the effort to walk per her report, asked about getting a power device for longer trips out of the house    Exercises Exercises: Other exercises(LE strength: R 4- to 4+, L 3+ to 4-)   Shoulder Instructions      Home Living Family/patient  expects to be discharged to:: Private residence Living Arrangements: Alone Available Help at Discharge: Family;Friend(s);Available PRN/intermittently Type of Home: House Home Access: Stairs to enter [employer] of Steps: 3 Entrance Stairs-Rails: Right Home Layout: One level     Bathroom Shower/Tub: Teacher, early years/pre: Standard     Home Equipment: Environmental consultant - 4 wheels;Cane - single point   Additional Comments: pt reports she has people in the house 90% of the time      Prior Functioning/Environment Level of Independence: Independent with assistive device(s)        Comments: has her sister's help the most, sister has worked in rehab setting        OT Problem List: Decreased strength;Decreased activity tolerance;Impaired balance (sitting and/or standing);Decreased safety awareness;Impaired tone;Pain      OT Treatment/Interventions:      OT Goals(Current goals can be found in the care plan section) Acute Rehab OT Goals Patient Stated Goal: to feel stronger esp LLE Potential to Achieve Goals: Good  OT Frequency:     Barriers to D/C:            Co-evaluation              AM-PAC OT "6 Clicks" Daily Activity     Outcome Measure Help from another person eating meals?: None Help from another person taking care of personal grooming?: A Little Help from another person toileting, which includes using toliet, bedpan, or urinal?: A Lot Help from another person bathing (including washing, rinsing, drying)?: A Lot Help from another person to put on and taking off regular upper body clothing?: None Help from another person to put on and taking off regular lower body clothing?: A Lot 6 Click Score: 17   End of Session Equipment Utilized During Treatment: Rolling walker Nurse Communication: Mobility status  Activity Tolerance: Patient limited by pain Patient left: in bed;with call bell/phone within reach  OT Visit Diagnosis: Unsteadiness on feet  (R26.81);Pain Pain - part of body: (bottom)                Time: 9450-3888 OT Time Calculation (min): 25 min Charges:  OT General Charges $OT Visit: 1 Visit OT Evaluation $OT Eval Low Complexity: 1 Low OT Treatments $Self Care/Home Management : 8-22 mins  Ebony Hail [name]) Marsa Aris OTR/L Acute Rehabilitation Services Pager: [phone] Office: [place] 01/11/2019, 3:59 PM

## 2019-01-11 NOTE — Transfer of Care (Signed)
Immediate Anesthesia Transfer of Care Note  Patient: Susan Horton  Procedure(s) Performed: INCISION AND DRAINAGE SACRAL ABSCESS (N/A Buttocks)  Patient Location: PACU  Anesthesia Type:General  Level of Consciousness: drowsy and patient cooperative  Airway & Oxygen Therapy: Patient Spontanous Breathing  Post-op Assessment: Report given to RN, Post -op Vital signs reviewed and stable and Patient moving all extremities X 4  Post vital signs: Reviewed and stable  Last Vitals:  Vitals Value Taken Time  BP 149/80 01/11/19 0959  Temp    Pulse 86 01/11/19 1000  Resp 16 01/11/19 1000  SpO2 99 % 01/11/19 1000  Vitals shown include unvalidated device data.  Last Pain:  Vitals:   01/11/19 0959  TempSrc:   PainSc: (P) Asleep      Patients Stated Pain Goal: 0 (15/05/69 7948)  Complications: No apparent anesthesia complications

## 2019-01-11 NOTE — Anesthesia Procedure Notes (Signed)
Procedure Name: Intubation Date/Time: 01/11/2019 9:32 AM Performed by: Larene Beach, CRNA Pre-anesthesia Checklist: Patient identified, Emergency Drugs available, Suction available and Patient being monitored Patient Re-evaluated:Patient Re-evaluated prior to induction Oxygen Delivery Method: Circle system utilized Preoxygenation: Pre-oxygenation with 100% oxygen Induction Type: IV induction, Rapid sequence and Cricoid Pressure applied Ventilation: Mask ventilation without difficulty Laryngoscope Size: Mac and 3 Grade View: Grade I Tube type: Oral Tube size: 7.0 mm Number of attempts: 1 Airway Equipment and Method: Stylet and Oral airway Placement Confirmation: ETT inserted through vocal cords under direct vision,  positive ETCO2 and breath sounds checked- equal and bilateral Secured at: 21 cm Tube secured with: Tape Dental Injury: Teeth and Oropharynx as per pre-operative assessment

## 2019-01-11 NOTE — Anesthesia Preprocedure Evaluation (Signed)
Anesthesia Evaluation  Patient identified by MRN, date of birth, ID band Patient awake    Reviewed: Allergy & Precautions, NPO status , Patient's Chart, lab work & pertinent test results  Airway Mallampati: II  TM Distance: >3 FB Neck ROM: Full    Dental no notable dental hx.    Pulmonary neg pulmonary ROS, former smoker,    Pulmonary exam normal breath sounds clear to auscultation       Cardiovascular hypertension, + CAD and + Cardiac Stents  Normal cardiovascular exam Rhythm:Regular Rate:Normal     Neuro/Psych negative neurological ROS  negative psych ROS   GI/Hepatic negative GI ROS, Neg liver ROS,   Endo/Other  diabetes  Renal/GU DialysisRenal disease  negative genitourinary   Musculoskeletal negative musculoskeletal ROS (+)   Abdominal   Peds negative pediatric ROS (+)  Hematology negative hematology ROS (+) anemia ,   Anesthesia Other Findings   Reproductive/Obstetrics negative OB ROS                             Anesthesia Physical Anesthesia Plan  ASA: IV  Anesthesia Plan: General   Post-op Pain Management:    Induction: Intravenous and Rapid sequence  PONV Risk Score and Plan: 3 and Ondansetron, Dexamethasone and Treatment may vary due to age or medical condition  Airway Management Planned: Oral ETT  Additional Equipment:   Intra-op Plan:   Post-operative Plan: Extubation in OR  Informed Consent: I have reviewed the patients History and Physical, chart, labs and discussed the procedure including the risks, benefits and alternatives for the proposed anesthesia with the patient or authorized representative who has indicated his/her understanding and acceptance.     Dental advisory given  Plan Discussed with: CRNA and Surgeon  Anesthesia Plan Comments:         Anesthesia Quick Evaluation

## 2019-01-11 NOTE — Progress Notes (Signed)
Day of Surgery   Subjective/Chief Complaint: C/O sacral pain, is NPO   Objective: Vital signs in last 24 hours: Temp:  [98.2 F (36.8 C)-98.8 F (37.1 C)] 98.8 F (37.1 C) (12/31 0626) Pulse Rate:  [86-144] 92 (12/31 0626) Resp:  [16-20] 16 (12/31 0626) BP: (112-176)/(56-93) 155/56 (12/31 0626) SpO2:  [91 %-100 %] 95 % (12/31 0626) Weight:  [78.1 kg] 78.1 kg (12/30 2245)    Intake/Output from previous day: 12/30 0701 - 12/31 0700 In: 617.9 [P.O.:180; I.V.:437.9] Out: 0  Intake/Output this shift: No intake/output data recorded.  General appearance: alert and cooperative Resp: clear to auscultation bilaterally GI: soft, NT Sacral induration with tenderness  Lab Results:  Recent Labs    01/09/19 1922 01/11/19 0552  WBC 29.6* 26.9*  HGB 8.2* 8.3*  HCT 26.2* 26.1*  PLT 510* 461*   BMET Recent Labs    01/09/19 1922 01/11/19 0552  NA 135 137  K 4.2 4.4  CL 92* 95*  CO2 24 21*  GLUCOSE 93 95  BUN 93* 105*  CREATININE 11.48* 11.85*  CALCIUM 9.1 8.3*   PT/INR Recent Labs    01/09/19 1922  LABPROT 14.4  INR 1.1   ABG No results for input(s): PHART, HCO3 in the last 72 hours.  Invalid input(s): PCO2, PO2  Studies/Results: MR PELVIS WO CONTRAST  Result Date: 01/10/2019 CLINICAL DATA:  Presacral abscess drainage January 01, 2019. Pain in the pelvis. History of perirectal abscess. EXAM: MRI PELVIS WITHOUT CONTRAST TECHNIQUE: Multiplanar multisequence MR imaging of the pelvis was performed. No intravenous contrast was administered. COMPARISON:  12/31/2018 FINDINGS: Nonstandard positioning was employed due to the patient's pain. Urinary Tract:  Urinary bladder unremarkable. Bowel:  No dilated bowel in the pelvis. Vascular/Lymphatic: No pathologic regional adenopathy observed. Reproductive:  Prior hysterectomy. Adnexa unremarkable. Other: Moderate pelvic ascites, new compared to the 12/31/2018 exam. Small amount of ascites along the right paracolic gutter.  Musculoskeletal: Extending under the tip of the coccyx but muscle along the posterior superficial margin of the coccyx and lower sacrum, we demonstrate a 7.0 by 4.9 by 8.3 cm (volume = 150 cm^3) abnormal fluid collection compatible with abscess. This has increased in volume compared to previous and likely represents some reaccumulation or reforming of the abscess. This partially extends along the medial margins of the gluteus maximus muscles, which demonstrate adjacent edema compatible with myositis. There is likely some myositis involving the superficial posterior portions of the spinalis thoracis muscles behind the sacrum. There is only a small amount of presacral edema, and no drainable collection anterior to the sacrum at this time. Much of the current abscess is very superficial within the subcutaneous tissues along the posteroinferior margin of the intergluteal cleft. There is subcutaneous edema along the lower anterior abdominal wall, and tracking anteriorly in the right thigh. IMPRESSION: 1. Interval increase in size of the abscess along the posterior superficial margin of the coccyx and lower sacrum, now measuring 150 cubic cm. 2. Moderate pelvic ascites, new compared to 12/20/20202020 exam. 3. There is some myositis involving the superficial posterior portions of the spinalis thoracis muscles behind the sacrum, and of the medial gluteus maximus muscles adjacent to the abscess. 4. There is some subcutaneous edema along the lower anterior abdominal wall and tracking anteriorly in the right thigh. Electronically Signed   By: Van Clines M.D.   On: 01/10/2019 18:34    Anti-infectives: Anti-infectives (From admission, onward)   Start     Dose/Rate Route Frequency Ordered Stop   01/10/19  2200  linezolid (ZYVOX) tablet 600 mg  Status:  Discontinued     600 mg Oral 2 times daily 01/10/19 1908 01/10/19 1914      Assessment/Plan: Sacral abscess - has worsened. Will proceed with incision and  drainage in the OR. I discussed the procedure, risks, and benefits. I also discussed the expected post-op course. She agrees.  LOS: 0 days    Susan Horton 01/11/2019

## 2019-01-11 NOTE — Consult Note (Signed)
Date of Admission:  01/09/2019          Reason for Consult:  Precoccygeal abscess   Referring Provider: Dr. Elige Ko   Assessment:  1.  Precoccygeal abscess 2. ? Coccygeal osteomyelitis 3. ESRD on PD 4. Leukocystosis   Plan:  1. Start vancomycin 2. Followup cultures 3. Check ESR and CRP  Active Problems:   Abscess of sacrum (HCC)   Scheduled Meds: . acetaminophen  650 mg Oral Q6H   Or  . acetaminophen  650 mg Rectal Q6H  . amitriptyline  10 mg Oral QHS  . calcitRIOL  1.5 mcg Oral Daily  . calcium acetate  1,334 mg Oral With snacks  . calcium acetate  2,001 mg Oral TID WC  . ferrous sulfate  325 mg Oral Daily  . fluticasone furoate-vilanterol  1 puff Inhalation Daily  . gentamicin cream  1 application Topical QHS  . HYDROmorphone      . insulin aspart  0-9 Units Subcutaneous Q4H  . isosorbide mononitrate  15 mg Oral Daily  . [START ON 01/12/2019] lidocaine-prilocaine  1 application Topical Q M,W,F-HD  . metoCLOPramide  5 mg Oral TID AC & HS  . metoprolol succinate  50 mg Oral Daily  . montelukast  10 mg Oral QHS  . multivitamin with minerals  1 tablet Oral Daily  . oxyCODONE      . pantoprazole  40 mg Oral Daily  . polyethylene glycol  17 g Oral BID  . pravastatin  40 mg Oral q1800  . pregabalin  75 mg Oral Daily  . senna-docusate  1 tablet Oral BID  . umeclidinium bromide  1 puff Inhalation Daily   Continuous Infusions: . sodium chloride 10 mL/hr at 01/11/19 0859  . dialysis solution 1.5% low-MG    . dialysis solution 1.5% low-MG/low-CA     PRN Meds:.albuterol, heparin, methocarbamol, mupirocin cream, nitroGLYCERIN, oxyCODONE  HPI: Susan Horton is a 48 y.o. female seen by me during recent hospitalization where she had gluteal abscess after fall that was not amenable to surgery but was aspirated by IR, peritonitis by cell count from peritoneal fluid who was ultimately DC on zyvox. She had markedly elevated WBC and there was concern for neoplasm and potential  intraabdominal connection between gluteal pathology and peritoneum. CT and flex sig were unrevealing. MRI suggested possible pelvic osteomyelitis though this was not completely clear. After DC she has had worsening pain and she was seen in ER with imaging showing  7.0 by 4.9 by 8.3 cm abscess near coccyx and sacrum. CCS have done I and D and removed large amount of purulent material which was sent for culture. No org seen on GS at this point  I will start vancomycin and followup culture data.   Review of Systems: ROS  As in HPI otherwise 12 pt ROS is negative.   Past Medical History:  Diagnosis Date  . Abnormal stress test   . Anemia   . Angio-edema   . Asthma   . CAD (coronary artery disease)    DES to mid LAD July 2018, residual moderate RCA disease  . Cataract   . CKD (chronic kidney disease) stage 4, GFR 15-29 ml/min (HCC)    Dialysis T/Th/Sa  . DVT (deep venous thrombosis) ([place])    1996 during pregnancy, 2015 left leg  . Eczema   . Essential hypertension 12/11/2015  . Gastroparesis   . GERD (gastroesophageal reflux disease)   . Gluteal abscess 12/27/2018  . Hidradenitis   .  Migraine   . Neuropathy   . Peripheral vascular disease ([place])    blood clot in leg  . Progressive angina ([place]) 06/05/2014   Chest pain  . Type 2 diabetes mellitus ([place])   . Type II diabetes mellitus ([place])   . Urticaria     Social History   Tobacco Use  . Smoking status: Former Smoker    Years: 20.00    Types: Cigarettes    Quit date: 11/2016    Years since quitting: 2.1  . Smokeless tobacco: Never Used  Substance Use Topics  . Alcohol use: No    Alcohol/week: 0.0 standard drinks  . Drug use: No    Family History  Problem Relation Age of Onset  . Allergic rhinitis Mother   . Hypertension Father   . Allergic rhinitis Father   . Hypertension Sister   . Allergic rhinitis Sister   . Hypertension Brother   . Allergic rhinitis Brother   . Breast cancer Cousin        x2   Allergies    Allergen Reactions  . Repatha [Evolocumab] Itching  . Lisinopril Cough    OBJECTIVE: Blood pressure (!) 145/55, pulse 89, temperature (!) 97.5 F (36.4 C), temperature source Oral, resp. rate 18, height '5\' 5"'$  (1.651 m), weight 78.8 kg, SpO2 97 %.  Physical Exam   AOX 3 HEENT: [place] AT CV: RR PULM no wheezes or resp distress GI nondistended EXt no deformities Neuro: nonfocal  Lab Results Lab Results  Component Value Date   WBC 26.9 (H) 01/11/2019   HGB 8.3 (L) 01/11/2019   HCT 26.1 (L) 01/11/2019   MCV 87.6 01/11/2019   PLT 461 (H) 01/11/2019    Lab Results  Component Value Date   CREATININE 11.85 (H) 01/11/2019   BUN 105 (H) 01/11/2019   NA 137 01/11/2019   K 4.4 01/11/2019   CL 95 (L) 01/11/2019   CO2 21 (L) 01/11/2019    Lab Results  Component Value Date   ALT 20 01/09/2019   AST 20 01/09/2019   ALKPHOS 349 (H) 01/09/2019   BILITOT 0.5 01/09/2019     Microbiology: Recent Results (from the past 240 hour(s))  Blood culture (routine x 2)     Status: None (Preliminary result)   Collection Time: 01/10/19 10:12 AM   Specimen: BLOOD  Result Value Ref Range Status   Specimen Description BLOOD RIGHT ANTECUBITAL  Final   Special Requests   Final    BOTTLES DRAWN AEROBIC AND ANAEROBIC Blood Culture adequate volume   Culture   Final    NO GROWTH 1 DAY Performed at [facility], [address] [place],  [address]    Report Status PENDING  Incomplete  SARS CORONAVIRUS 2 (TAT 6-24 HRS) Nasopharyngeal Nasopharyngeal Swab     Status: None   Collection Time: 01/10/19  6:51 PM   Specimen: Nasopharyngeal Swab  Result Value Ref Range Status   SARS Coronavirus 2 NEGATIVE NEGATIVE Final    Comment: (NOTE) SARS-CoV-2 target nucleic acids are NOT DETECTED. The SARS-CoV-2 RNA is generally detectable in upper and lower respiratory specimens during the acute phase of infection. Negative results do not preclude SARS-CoV-2 infection, do not rule out co-infections  with other pathogens, and should not be used as the sole basis for treatment or other patient management decisions. Negative results must be combined with clinical observations, patient history, and epidemiological information. The expected result is Negative. Fact Sheet for Patients: SugarRoll.be Fact Sheet for Healthcare  Providers: https://www.woods-mathews.com/ This test is not yet approved or cleared by the [place] and  has been authorized for detection and/or diagnosis of SARS-CoV-2 by FDA under an Emergency Use Authorization (EUA). This EUA will remain  in effect (meaning this test can be used) for the duration of the COVID-19 declaration under Section 56 4(b)(1) of the Act, 21 U.S.C. section 360bbb-3(b)(1), unless the authorization is terminated or revoked sooner. Performed at [facility], [place] [address] [place], [place] [address]   Culture, blood (Routine X 2) w Reflex to ID Panel     Status: None (Preliminary result)   Collection Time: 01/11/19  5:50 AM   Specimen: BLOOD RIGHT HAND  Result Value Ref Range Status   Specimen Description BLOOD RIGHT HAND  Final   Special Requests   Final    BOTTLES DRAWN AEROBIC ONLY Blood Culture results may not be optimal due to an inadequate volume of blood received in culture bottles   Culture   Final    NO GROWTH < 12 HOURS Performed at [facility], [place] [address], [place], [place] [address]    Report Status PENDING  Incomplete  Aerobic/Anaerobic Culture (surgical/deep wound)     Status: None (Preliminary result)   Collection Time: 01/11/19  9:35 AM   Specimen: PATH Other; Tissue  Result Value Ref Range Status   Specimen Description ABSCESS SACRAL  Final   Special Requests PATIENT ON FOLLOWING LINZOLID  Final   Gram Stain   Final    FEW WBC PRESENT, PREDOMINANTLY PMN NO ORGANISMS SEEN Performed at [facility], [place] [address] [place], [place] [address]     Culture PENDING  Incomplete   Report Status PENDING  Incomplete    Alcide Evener, MD [facility] [phone] pager  01/11/2019, 6:56 PM

## 2019-01-12 DIAGNOSIS — Z992 Dependence on renal dialysis: Secondary | ICD-10-CM | POA: Diagnosis not present

## 2019-01-12 DIAGNOSIS — E440 Moderate protein-calorie malnutrition: Secondary | ICD-10-CM | POA: Diagnosis not present

## 2019-01-12 DIAGNOSIS — N186 End stage renal disease: Secondary | ICD-10-CM | POA: Diagnosis not present

## 2019-01-12 DIAGNOSIS — Z4932 Encounter for adequacy testing for peritoneal dialysis: Secondary | ICD-10-CM | POA: Diagnosis not present

## 2019-01-12 DIAGNOSIS — N2581 Secondary hyperparathyroidism of renal origin: Secondary | ICD-10-CM | POA: Diagnosis not present

## 2019-01-12 DIAGNOSIS — E1122 Type 2 diabetes mellitus with diabetic chronic kidney disease: Secondary | ICD-10-CM | POA: Diagnosis not present

## 2019-01-12 DIAGNOSIS — D509 Iron deficiency anemia, unspecified: Secondary | ICD-10-CM | POA: Diagnosis not present

## 2019-01-12 LAB — CBC WITH DIFFERENTIAL/PLATELET
Abs Immature Granulocytes: 0.14 10*3/uL — ABNORMAL HIGH (ref 0.00–0.07)
Basophils Absolute: 0 10*3/uL (ref 0.0–0.1)
Basophils Relative: 0 %
Eosinophils Absolute: 0 10*3/uL (ref 0.0–0.5)
Eosinophils Relative: 0 %
HCT: 23.5 % — ABNORMAL LOW (ref 36.0–46.0)
Hemoglobin: 7.6 g/dL — ABNORMAL LOW (ref 12.0–15.0)
Immature Granulocytes: 1 %
Lymphocytes Relative: 6 %
Lymphs Abs: 1 10*3/uL (ref 0.7–4.0)
MCH: 28 pg (ref 26.0–34.0)
MCHC: 32.3 g/dL (ref 30.0–36.0)
MCV: 86.7 fL (ref 80.0–100.0)
Monocytes Absolute: 0.5 10*3/uL (ref 0.1–1.0)
Monocytes Relative: 3 %
Neutro Abs: 14.4 10*3/uL — ABNORMAL HIGH (ref 1.7–7.7)
Neutrophils Relative %: 90 %
Platelets: 441 10*3/uL — ABNORMAL HIGH (ref 150–400)
RBC: 2.71 MIL/uL — ABNORMAL LOW (ref 3.87–5.11)
RDW: 14.9 % (ref 11.5–15.5)
WBC: 16 10*3/uL — ABNORMAL HIGH (ref 4.0–10.5)
nRBC: 0.1 % (ref 0.0–0.2)

## 2019-01-12 LAB — RENAL FUNCTION PANEL
Albumin: 1.6 g/dL — ABNORMAL LOW (ref 3.5–5.0)
Anion gap: 18 — ABNORMAL HIGH (ref 5–15)
BUN: 104 mg/dL — ABNORMAL HIGH (ref 6–20)
CO2: 23 mmol/L (ref 22–32)
Calcium: 8.3 mg/dL — ABNORMAL LOW (ref 8.9–10.3)
Chloride: 95 mmol/L — ABNORMAL LOW (ref 98–111)
Creatinine, Ser: 10.58 mg/dL — ABNORMAL HIGH (ref 0.44–1.00)
GFR calc Af Amer: 4 mL/min — ABNORMAL LOW (ref >60)
GFR calc non Af Amer: 4 mL/min — ABNORMAL LOW (ref >60)
Glucose, Bld: 256 mg/dL — ABNORMAL HIGH (ref 70–99)
Phosphorus: 9.5 mg/dL — ABNORMAL HIGH (ref 2.5–4.6)
Potassium: 4.6 mmol/L (ref 3.5–5.1)
Sodium: 136 mmol/L (ref 135–145)

## 2019-01-12 LAB — C-REACTIVE PROTEIN: CRP: 22.8 mg/dL — ABNORMAL HIGH (ref <1.0)

## 2019-01-12 LAB — GLUCOSE, CAPILLARY
Glucose-Capillary: 132 mg/dL — ABNORMAL HIGH (ref 70–99)
Glucose-Capillary: 175 mg/dL — ABNORMAL HIGH (ref 70–99)
Glucose-Capillary: 197 mg/dL — ABNORMAL HIGH (ref 70–99)
Glucose-Capillary: 228 mg/dL — ABNORMAL HIGH (ref 70–99)
Glucose-Capillary: 239 mg/dL — ABNORMAL HIGH (ref 70–99)
Glucose-Capillary: 274 mg/dL — ABNORMAL HIGH (ref 70–99)

## 2019-01-12 LAB — SEDIMENTATION RATE: Sed Rate: 132 mm/hr — ABNORMAL HIGH (ref 0–22)

## 2019-01-12 MED ORDER — DELFLEX-LC/2.5% DEXTROSE 394 MOSM/L IP SOLN: Freq: Four times a day (QID) | INTRAPERITONEAL | Status: DC

## 2019-01-12 MED ORDER — VANCOMYCIN HCL 1500 MG/300ML IV SOLN
1500.0000 mg | Freq: Once | INTRAVENOUS | Status: AC
  Administered 2019-01-12: 1500 mg via INTRAVENOUS
  Filled 2019-01-12: qty 300

## 2019-01-12 MED ORDER — CALCIUM ACETATE (PHOS BINDER) 667 MG PO CAPS
2668.0000 mg | ORAL_CAPSULE | Freq: Three times a day (TID) | ORAL | Status: DC
  Administered 2019-01-12 – 2019-01-14 (×5): 2668 mg via ORAL
  Filled 2019-01-12 (×6): qty 4

## 2019-01-12 MED ORDER — INSULIN GLARGINE 100 UNIT/ML ~~LOC~~ SOLN
10.0000 [IU] | Freq: Two times a day (BID) | SUBCUTANEOUS | Status: DC
  Administered 2019-01-12 – 2019-01-14 (×5): 10 [IU] via SUBCUTANEOUS
  Filled 2019-01-12 (×6): qty 0.1

## 2019-01-12 MED ORDER — ASPIRIN EC 81 MG PO TBEC
81.0000 mg | DELAYED_RELEASE_TABLET | Freq: Every day | ORAL | Status: DC
  Administered 2019-01-12 – 2019-01-14 (×3): 81 mg via ORAL
  Filled 2019-01-12 (×3): qty 1

## 2019-01-12 MED ORDER — DARBEPOETIN ALFA 40 MCG/0.4ML IJ SOSY
40.0000 ug | PREFILLED_SYRINGE | INTRAMUSCULAR | Status: DC
  Administered 2019-01-12: 40 ug via SUBCUTANEOUS
  Filled 2019-01-12: qty 0.4

## 2019-01-12 MED ORDER — INSULIN ASPART 100 UNIT/ML ~~LOC~~ SOLN
0.0000 [IU] | Freq: Three times a day (TID) | SUBCUTANEOUS | Status: DC
  Administered 2019-01-12: 2 [IU] via SUBCUTANEOUS
  Administered 2019-01-12 – 2019-01-14 (×3): 1 [IU] via SUBCUTANEOUS

## 2019-01-12 MED ORDER — DELFLEX-LC/4.25% DEXTROSE 483 MOSM/L IP SOLN: Freq: Four times a day (QID) | INTRAPERITONEAL | Status: DC

## 2019-01-12 MED ORDER — CLOPIDOGREL BISULFATE 75 MG PO TABS
75.0000 mg | ORAL_TABLET | Freq: Every day | ORAL | Status: DC
  Administered 2019-01-12 – 2019-01-14 (×3): 75 mg via ORAL
  Filled 2019-01-12 (×3): qty 1

## 2019-01-12 NOTE — Progress Notes (Signed)
Subjective: She feels much much better   Antibiotics:  Anti-infectives (From admission, onward)   Start     Dose/Rate Route Frequency Ordered Stop   01/12/19 1230  vancomycin (VANCOREADY) IVPB 1500 mg/300 mL     1,500 mg 150 mL/hr over 120 Minutes Intravenous  Once 01/12/19 1218     01/11/19 1200  fluconazole (DIFLUCAN) tablet 150 mg     150 mg Oral  Once 01/11/19 1157 01/11/19 1320   01/10/19 2200  linezolid (ZYVOX) tablet 600 mg  Status:  Discontinued     600 mg Oral 2 times daily 01/10/19 1908 01/10/19 1914      Medications: Scheduled Meds: . acetaminophen  650 mg Oral Q6H   Or  . acetaminophen  650 mg Rectal Q6H  . amitriptyline  10 mg Oral QHS  . aspirin EC  81 mg Oral Daily  . calcitRIOL  1.5 mcg Oral Daily  . calcium acetate  1,334 mg Oral With snacks  . calcium acetate  2,668 mg Oral TID WC  . clopidogrel  75 mg Oral Daily  . darbepoetin (ARANESP) injection - NON-DIALYSIS  40 mcg Subcutaneous Q Fri-1800  . ferrous sulfate  325 mg Oral Daily  . fluticasone furoate-vilanterol  1 puff Inhalation Daily  . gentamicin cream  1 application Topical QHS  . insulin aspart  0-9 Units Subcutaneous TID WC  . insulin glargine  10 Units Subcutaneous BID  . isosorbide mononitrate  15 mg Oral Daily  . lidocaine-prilocaine  1 application Topical Q M,W,F-HD  . metoCLOPramide  5 mg Oral TID AC & HS  . metoprolol succinate  50 mg Oral Daily  . montelukast  10 mg Oral QHS  . multivitamin with minerals  1 tablet Oral Daily  . pantoprazole  40 mg Oral Daily  . polyethylene glycol  17 g Oral BID  . pravastatin  40 mg Oral q1800  . pregabalin  75 mg Oral Daily  . senna-docusate  1 tablet Oral BID  . umeclidinium bromide  1 puff Inhalation Daily   Continuous Infusions: . sodium chloride 10 mL/hr at 01/11/19 0859  . dialysis solution 1.5% low-MG    . dialysis solution 1.5% low-MG/low-CA    . dialysis solution 2.5% low-MG/low-CA    . dialysis solution 4.25% low-MG/low-CA     . vancomycin     PRN Meds:.albuterol, heparin, methocarbamol, mupirocin cream, nitroGLYCERIN, oxyCODONE    Objective: Weight change: -0.064 kg  Intake/Output Summary (Last 24 hours) at 01/12/2019 1235 Last data filed at 01/12/2019 0600 Gross per 24 hour  Intake 360 ml  Output 0 ml  Net 360 ml   Blood pressure (!) 159/73, pulse 89, temperature 97.8 F (36.6 C), temperature source Oral, resp. rate 18, height 5\' 5"  (1.651 m), weight 79.5 kg, SpO2 100 %. Temp:  [97.5 F (36.4 C)-98.7 F (37.1 C)] 97.8 F (36.6 C) (01/01 0853) Pulse Rate:  [82-91] 89 (01/01 0853) Resp:  [15-20] 18 (01/01 0853) BP: (110-159)/(55-96) 159/73 (01/01 0853) SpO2:  [96 %-100 %] 100 % (01/01 0853) Weight:  [78.8 kg-79.5 kg] 79.5 kg (01/01 0630)  Physical Exam: General: Alert and awake, oriented x3, not in any acute distress. HEENT: anicteric sclera, EOMI CVS regular rate, normal  Chest: , no wheezing, no respiratory distress Abdomen: soft non-distended,  Extremities: no edema or deformity noted bilaterally Skin: no rashes, wound is packed Neuro: nonfocal  CBC:    BMET Recent Labs    01/11/19 0552 01/12/19 0520  NA 137 136  K 4.4 4.6  CL 95* 95*  CO2 21* 23  GLUCOSE 95 256*  BUN 105* 104*  CREATININE 11.85* 10.58*  CALCIUM 8.3* 8.3*     Liver Panel  Recent Labs    01/09/19 1922 01/12/19 0520  PROT 6.7  --   ALBUMIN 1.7* 1.6*  AST 20  --   ALT 20  --   ALKPHOS 349*  --   BILITOT 0.5  --        Sedimentation Rate Recent Labs    01/12/19 1036  ESRSEDRATE 132*   C-Reactive Protein Recent Labs    01/12/19 1036  CRP 22.8*    Micro Results: Recent Results (from the past 720 hour(s))  Culture, blood (routine x 2)     Status: None   Collection Time: 12/25/18 12:54 PM   Specimen: BLOOD  Result Value Ref Range Status   Specimen Description BLOOD RIGHT ANTECUBITAL  Final   Special Requests   Final    BOTTLES DRAWN AEROBIC AND ANAEROBIC Blood Culture adequate volume    Culture   Final    NO GROWTH 5 DAYS Performed at [facility], [place] [address] [place], [place] [address]    Report Status 12/30/2018 FINAL  Final  SARS CORONAVIRUS 2 (TAT 6-24 HRS) Nasopharyngeal Nasopharyngeal Swab     Status: None   Collection Time: 12/25/18  5:24 PM   Specimen: Nasopharyngeal Swab  Result Value Ref Range Status   SARS Coronavirus 2 NEGATIVE NEGATIVE Final    Comment: (NOTE) SARS-CoV-2 target nucleic acids are NOT DETECTED. The SARS-CoV-2 RNA is generally detectable in upper and lower respiratory specimens during the acute phase of infection. Negative results do not preclude SARS-CoV-2 infection, do not rule out co-infections with other pathogens, and should not be used as the sole basis for treatment or other patient management decisions. Negative results must be combined with clinical observations, patient history, and epidemiological information. The expected result is Negative. Fact Sheet for Patients: SugarRoll.be Fact Sheet for Healthcare Providers: https://www.woods-mathews.com/ This test is not yet approved or cleared by the [place] FDA and  has been authorized for detection and/or diagnosis of SARS-CoV-2 by FDA under an Emergency Use Authorization (EUA). This EUA will remain  in effect (meaning this test can be used) for the duration of the COVID-19 declaration under Section 56 4(b)(1) of the Act, 21 U.S.C. section 360bbb-3(b)(1), unless the authorization is terminated or revoked sooner. Performed at [facility], [place] [address], [place], [place] [address]   Culture, blood (Routine X 2) w Reflex to ID Panel     Status: None   Collection Time: 12/26/18  6:47 AM   Specimen: BLOOD RIGHT HAND  Result Value Ref Range Status   Specimen Description BLOOD RIGHT HAND  Final   Special Requests   Final    BOTTLES DRAWN AEROBIC ONLY Blood Culture results may not be optimal due to an inadequate volume  of blood received in culture bottles   Culture   Final    NO GROWTH 5 DAYS Performed at [facility], [place] [address] [place], [place] [address]    Report Status 12/31/2018 FINAL  Final  Body fluid culture     Status: None   Collection Time: 12/26/18  3:03 PM   Specimen: Peritoneal Dialysis; Body Fluid  Result Value Ref Range Status   Specimen Description PERITONEAL DIALYSIS  Final   Special Requests NONE  Final   Gram Stain  Final    RARE WBC PRESENT, PREDOMINANTLY PMN NO ORGANISMS SEEN    Culture   Final    NO GROWTH 3 DAYS Performed at [facility] [address] [place], [place] [address]    Report Status 12/30/2018 FINAL  Final  Culture, fungus without smear     Status: None (Preliminary result)   Collection Time: 01/01/19  4:49 PM   Specimen: PATH Other; Abscess  Result Value Ref Range Status   Specimen Description ABSCESS SACRAL  Final   Special Requests NONE  Final   Culture   Final    NO FUNGUS ISOLATED AFTER 11 DAYS Performed at [facility], [address] [place], [place] [address]    Report Status PENDING  Incomplete  Aerobic/Anaerobic Culture (surgical/deep wound)     Status: None   Collection Time: 01/01/19  4:49 PM   Specimen: PATH Other; Abscess  Result Value Ref Range Status   Specimen Description ABSCESS SACRAL  Final   Special Requests NONE  Final   Gram Stain   Final    RARE WBC PRESENT, PREDOMINANTLY PMN NO ORGANISMS SEEN    Culture   Final    No growth aerobically or anaerobically. Performed at [facility], [place] [address], [place], [place] [address]    Report Status 01/06/2019 FINAL  Final  Blood culture (routine x 2)     Status: None (Preliminary result)   Collection Time: 01/10/19 10:12 AM   Specimen: BLOOD  Result Value Ref Range Status   Specimen Description BLOOD RIGHT ANTECUBITAL  Final   Special Requests   Final    BOTTLES DRAWN AEROBIC AND ANAEROBIC Blood Culture adequate volume   Culture   Final    NO  GROWTH 1 DAY Performed at [facility], [place] [address], [place], [place] [address]    Report Status PENDING  Incomplete  SARS CORONAVIRUS 2 (TAT 6-24 HRS) Nasopharyngeal Nasopharyngeal Swab     Status: None   Collection Time: 01/10/19  6:51 PM   Specimen: Nasopharyngeal Swab  Result Value Ref Range Status   SARS Coronavirus 2 NEGATIVE NEGATIVE Final    Comment: (NOTE) SARS-CoV-2 target nucleic acids are NOT DETECTED. The SARS-CoV-2 RNA is generally detectable in upper and lower respiratory specimens during the acute phase of infection. Negative results do not preclude SARS-CoV-2 infection, do not rule out co-infections with other pathogens, and should not be used as the sole basis for treatment or other patient management decisions. Negative results must be combined with clinical observations, patient history, and epidemiological information. The expected result is Negative. Fact Sheet for Patients: SugarRoll.be Fact Sheet for Healthcare Providers: https://www.woods-mathews.com/ This test is not yet approved or cleared by the [place] FDA and  has been authorized for detection and/or diagnosis of SARS-CoV-2 by FDA under an Emergency Use Authorization (EUA). This EUA will remain  in effect (meaning this test can be used) for the duration of the COVID-19 declaration under Section 56 4(b)(1) of the Act, 21 U.S.C. section 360bbb-3(b)(1), unless the authorization is terminated or revoked sooner. Performed at [facility], [place] [address], [place], [place] [address]   Culture, blood (Routine X 2) w Reflex to ID Panel     Status: None (Preliminary result)   Collection Time: 01/11/19  5:50 AM   Specimen: BLOOD RIGHT HAND  Result Value Ref Range Status   Specimen Description BLOOD RIGHT HAND  Final   Special Requests   Final  BOTTLES DRAWN AEROBIC ONLY Blood Culture results may not be optimal due to an inadequate volume of blood  received in culture bottles   Culture   Final    NO GROWTH < 12 HOURS Performed at [facility] [address], [place], [place] [address]    Report Status PENDING  Incomplete  Aerobic/Anaerobic Culture (surgical/deep wound)     Status: None (Preliminary result)   Collection Time: 01/11/19  9:35 AM   Specimen: PATH Other; Tissue  Result Value Ref Range Status   Specimen Description ABSCESS SACRAL  Final   Special Requests PATIENT ON FOLLOWING LINZOLID  Final   Gram Stain   Final    FEW WBC PRESENT, PREDOMINANTLY PMN NO ORGANISMS SEEN    Culture   Final    NO GROWTH 1 DAY Performed at [facility], [place] [address] [place], [place] [address]    Report Status PENDING  Incomplete    Studies/Results: MR PELVIS WO CONTRAST  Result Date: 01/10/2019 CLINICAL DATA:  Presacral abscess drainage January 01, 2019. Pain in the pelvis. History of perirectal abscess. EXAM: MRI PELVIS WITHOUT CONTRAST TECHNIQUE: Multiplanar multisequence MR imaging of the pelvis was performed. No intravenous contrast was administered. COMPARISON:  12/31/2018 FINDINGS: Nonstandard positioning was employed due to the patient's pain. Urinary Tract:  Urinary bladder unremarkable. Bowel:  No dilated bowel in the pelvis. Vascular/Lymphatic: No pathologic regional adenopathy observed. Reproductive:  Prior hysterectomy. Adnexa unremarkable. Other: Moderate pelvic ascites, new compared to the 12/31/2018 exam. Small amount of ascites along the right paracolic gutter. Musculoskeletal: Extending under the tip of the coccyx but muscle along the posterior superficial margin of the coccyx and lower sacrum, we demonstrate a 7.0 by 4.9 by 8.3 cm (volume = 150 cm^3) abnormal fluid collection compatible with abscess. This has increased in volume compared to previous and likely represents some reaccumulation or reforming of the abscess. This partially extends along the medial margins of the gluteus maximus muscles, which  demonstrate adjacent edema compatible with myositis. There is likely some myositis involving the superficial posterior portions of the spinalis thoracis muscles behind the sacrum. There is only a small amount of presacral edema, and no drainable collection anterior to the sacrum at this time. Much of the current abscess is very superficial within the subcutaneous tissues along the posteroinferior margin of the intergluteal cleft. There is subcutaneous edema along the lower anterior abdominal wall, and tracking anteriorly in the right thigh. IMPRESSION: 1. Interval increase in size of the abscess along the posterior superficial margin of the coccyx and lower sacrum, now measuring 150 cubic cm. 2. Moderate pelvic ascites, new compared to 12/20/20202020 exam. 3. There is some myositis involving the superficial posterior portions of the spinalis thoracis muscles behind the sacrum, and of the medial gluteus maximus muscles adjacent to the abscess. 4. There is some subcutaneous edema along the lower anterior abdominal wall and tracking anteriorly in the right thigh. Electronically Signed   By: Van Clines M.D.   On: 01/10/2019 18:34      Assessment/Plan:  INTERVAL HISTORY: Cultures unrevealing so far   Active Problems:   Abscess of sacrum ([place])    LESLI ISSA is a 49 y.o. female with  49 y.o. female seen by me during recent hospitalization where she had gluteal abscess after fall that was not amenable to surgery but was aspirated by IR, peritonitis by cell count from peritoneal fluid who was ultimately DC on zyvox. She had markedly elevated WBC and  there was concern for neoplasm and potential intraabdominal connection between gluteal pathology and peritoneum. CT and flex sig were unrevealing. MRI suggested possible pelvic osteomyelitis though this was not completely clear. After DC she has had worsening pain and she was seen in ER with imaging showing  7.0 by 4.9 by 8.3 cm abscess near coccyx and  sacrum. CCS have done I and D and removed large amount of purulent material which was sent for culture  Cultures are unrevealing so far  IF they continue to not yield an organism I would DC her on zyvox 600 mg q 12 hours x 14 days  I do NOT think she has osteomyelitis but we can re-assess with inflammatory markers exam as an outpatient  I have cancelled her appt with Dr. Baxter Flattery for this week and rescheduled now  [name] has an appointment on 01/25/2018 at [facility] with Janene Madeira, NP  The [facility] for Infectious Disease is located in the [facility] at  [place] in [place].  [address], which is located to the left of the elevators.  Phone: [phone]  Fax: [phone]  [email or web address]  She should arrive 15 minutes prior to her appointment.  Dr. Linus Salmons is available this weekend for questions and will follow up her culture data.     LOS: 1 day   Alcide Evener 01/12/2019, 12:35 PM

## 2019-01-12 NOTE — Progress Notes (Addendum)
1 Day Post-Op    CC:  abscess  Subjective: Dressing/packing is out and site looks fine, still sore, but otherwise OK.    Objective: Vital signs in last 24 hours: Temp:  [97.5 F (36.4 C)-98.7 F (37.1 C)] 97.8 F (36.6 C) (01/01 0853) Pulse Rate:  [66-97] 89 (01/01 0853) Resp:  [15-20] 18 (01/01 0853) BP: (92-159)/(55-97) 159/73 (01/01 0853) SpO2:  [95 %-100 %] 100 % (01/01 0853) Weight:  [78.8 kg-79.5 kg] 79.5 kg (01/01 0630) Last BM Date: 01/11/19 480 p.o. 250 IV No urine output recorded Emesis 900 No stool recorded Afebrile vital signs are stable Creatinine 10.58 Glucose 256 WBC 16.0 H/H 7.6/23.5   Intake/Output from previous day: 12/31 0701 - 01/01 0700 In: 730 [P.O.:480; I.V.:250] Out: 910 [Emesis/NG output:900; Blood:10] Intake/Output this shift: No intake/output data recorded.  General appearance: alert, cooperative and no distress Skin: open site is clean, no drainage.  packing is already out.   Lab Results:  Recent Labs    01/11/19 0552 01/12/19 0520  WBC 26.9* 16.0*  HGB 8.3* 7.6*  HCT 26.1* 23.5*  PLT 461* 441*    BMET Recent Labs    01/11/19 0552 01/12/19 0520  NA 137 136  K 4.4 4.6  CL 95* 95*  CO2 21* 23  GLUCOSE 95 256*  BUN 105* 104*  CREATININE 11.85* 10.58*  CALCIUM 8.3* 8.3*   PT/INR Recent Labs    01/09/19 1922  LABPROT 14.4  INR 1.1    Recent Labs  Lab 01/09/19 1922 01/12/19 0520  AST 20  --   ALT 20  --   ALKPHOS 349*  --   BILITOT 0.5  --   PROT 6.7  --   ALBUMIN 1.7* 1.6*     Lipase     Component Value Date/Time   LIPASE 15 12/25/2018 1115     Medications: . acetaminophen  650 mg Oral Q6H   Or  . acetaminophen  650 mg Rectal Q6H  . amitriptyline  10 mg Oral QHS  . aspirin EC  81 mg Oral Daily  . calcitRIOL  1.5 mcg Oral Daily  . calcium acetate  1,334 mg Oral With snacks  . calcium acetate  2,001 mg Oral TID WC  . clopidogrel  75 mg Oral Daily  . ferrous sulfate  325 mg Oral Daily  .  fluticasone furoate-vilanterol  1 puff Inhalation Daily  . gentamicin cream  1 application Topical QHS  . insulin aspart  0-9 Units Subcutaneous Q4H  . isosorbide mononitrate  15 mg Oral Daily  . lidocaine-prilocaine  1 application Topical Q M,W,F-HD  . metoCLOPramide  5 mg Oral TID AC & HS  . metoprolol succinate  50 mg Oral Daily  . montelukast  10 mg Oral QHS  . multivitamin with minerals  1 tablet Oral Daily  . pantoprazole  40 mg Oral Daily  . polyethylene glycol  17 g Oral BID  . pravastatin  40 mg Oral q1800  . pregabalin  75 mg Oral Daily  . senna-docusate  1 tablet Oral BID  . umeclidinium bromide  1 puff Inhalation Daily   Prior to Admission medications   Medication Sig Start Date End Date Taking? Authorizing Provider  albuterol (PROVENTIL HFA;VENTOLIN HFA) 108 (90 BASE) MCG/ACT inhaler Inhale 1-2 puffs into the lungs every 6 (six) hours as needed for wheezing or shortness of breath.    Yes [provider]  amitriptyline (ELAVIL) 10 MG tablet Take 1 tablet (10 mg total) by mouth  at bedtime. 09/19/18  Yes Jamse Arn, MD  aspirin 81 MG chewable tablet Chew 1 tablet (81 mg total) by mouth daily. 08/07/16  Yes Cheryln Manly, NP  B Complex-C-Zn-Folic Acid (DIALYVITE 188 WITH ZINC) 0.8 MG TABS Take 1 tablet by mouth daily. 10/06/18  Yes [provider]  calcitRIOL (ROCALTROL) 0.5 MCG capsule Take 1.5 mcg by mouth daily. 09/11/18  Yes [provider]  calcium acetate (PHOSLO) 667 MG capsule Take 1,334-2,001 mg by mouth See admin instructions. Take three capsules (2001 mg) by mouth up to three times daily with meals and two capsules (1334 mg) with snacks 09/23/17  Yes [provider]  clopidogrel (PLAVIX) 75 MG tablet Take 1 tablet (75 mg total) by mouth daily with breakfast. 10/19/17  Yes Kilroy, Luke K, PA-C  Ferrous Sulfate (IRON) 325 (65 Fe) MG TABS Take 325 mg by mouth daily. 11/25/15  Yes Noel, Tiffany S, PA-C  Fluticasone-Salmeterol (ADVAIR)  250-50 MCG/DOSE AEPB Inhale 1 puff into the lungs 2 (two) times daily. Patient taking differently: Inhale 1 puff into the lungs daily as needed (shortness of breath).  06/10/17  Yes Charlott Rakes, MD  gentamicin cream (GARAMYCIN) 0.1 % Apply 1 application topically See admin instructions. Apply to access site nightly 09/11/18  Yes [provider]  Insulin Glargine, 1 Unit Dial, (TOUJEO SOLOSTAR) 300 UNIT/ML SOPN Inject 10 Units into the skin 2 (two) times daily. 01/05/19  Yes Lattie Haw, MD  insulin regular (HUMULIN R) 100 units/mL injection Inject 0.2-0.25 mLs (20-25 Units total) into the skin See admin instructions. Patient taking differently: Inject 7-15 Units into the skin 3 (three) times daily before meals. Per CBG 09/22/17  Yes Philemon Kingdom, MD  isosorbide mononitrate (IMDUR) 30 MG 24 hr tablet Take 0.5 tablets (15 mg total) by mouth daily. 10/19/17 01/10/19 Yes Kilroy, Luke K, PA-C  lidocaine-prilocaine (EMLA) cream Apply 1 application topically See admin instructions. Apply small amount to access site (AVF) 30 minutes before hemodialysis, cover with occlusive dressing (Saran Wrap) if going out to dialysis center 09/15/17  Yes [provider]  linezolid (ZYVOX) 600 MG tablet Take 1 tablet (600 mg total) by mouth 2 (two) times daily for 14 days. 01/05/19 01/19/19 Yes Lattie Haw, MD  lovastatin (MEVACOR) 20 MG tablet Take 20 mg by mouth at bedtime.    Yes [provider]  methocarbamol (ROBAXIN) 500 MG tablet Take 1 tablet (500 mg total) by mouth 2 (two) times daily as needed for muscle spasms. 09/07/17  Yes Patel, Domenick Bookbinder, MD  metoCLOPramide (REGLAN) 5 MG tablet TAKE 1 TABLET BY MOUTH 4 TIMES DAILY BEFORE MEALS AND AT BEDTIME. Patient taking differently: Take 5 mg by mouth 4 (four) times daily -  before meals and at bedtime.  03/18/17  Yes [name], MD  metoprolol succinate (TOPROL-XL) 50 MG 24 hr tablet Take 1 tablet (50 mg total) by mouth daily. 03/14/18   Yes [name], MD  montelukast (SINGULAIR) 10 MG tablet Take 1 tablet (10 mg total) by mouth at bedtime. Patient taking differently: Take 10 mg by mouth at bedtime as needed (allergies).  04/12/18  Yes Padgett, Rae Halsted, MD  mupirocin cream (BACTROBAN) 2 % Apply 1 application topically 2 (two) times daily as needed. To affected areas and cover with non-stick gauze. Patient taking differently: Apply 1 application topically 2 (two) times daily as needed (for infection. Cover with non-stick gauze.).  11/17/18  Yes Amyot, Nicholes Stairs, NP  nitroGLYCERIN (NITROSTAT) 0.4 MG  SL tablet Place 1 tablet (0.4 mg total) under the tongue every 5 (five) minutes as needed for chest pain. 10/19/17  Yes Kilroy, Luke K, PA-C  pantoprazole (PROTONIX) 40 MG tablet TAKE 1 TABLET BY MOUTH DAILY. Patient taking differently: Take 40 mg by mouth daily.  02/14/18  Yes [name], MD  polyethylene glycol (MIRALAX / GLYCOLAX) 17 g packet Take 17 g by mouth 2 (two) times daily for 7 days. 01/05/19 01/12/19 Yes Lattie Haw, MD  pregabalin (LYRICA) 75 MG capsule Take 1 capsule (75 mg total) by mouth daily. 09/19/18  Yes Patel, Domenick Bookbinder, MD  senna-docusate (SENOKOT-S) 8.6-50 MG tablet Take 1 tablet by mouth 2 (two) times daily for 7 days. 01/05/19 01/12/19 Yes Lattie Haw, MD  trimethoprim-polymyxin b (POLYTRIM) ophthalmic solution Place 1 drop into both eyes See admin instructions. Instill one drop into both eyes four times daily for 2 days after each monthly eye injection 06/16/17  Yes [provider]    . sodium chloride 10 mL/hr at 01/11/19 0859  . dialysis solution 1.5% low-MG    . dialysis solution 1.5% low-MG/low-CA     Anti-infectives (From admission, onward)   Start     Dose/Rate Route Frequency Ordered Stop   01/11/19 1200  fluconazole (DIFLUCAN) tablet 150 mg     150 mg Oral  Once 01/11/19 1157 01/11/19 1320   01/10/19 2200  linezolid (ZYVOX) tablet 600 mg  Status:  Discontinued     600 mg  Oral 2 times daily 01/10/19 1908 01/10/19 1914      Assessment/Plan End-stage renal disease -peritoneal dialysis at home CAD/DES stents -Plavix/aspirin PVD Type 2 diabetes Gastroparesis Hypertension Asthma  Sacral abscess I&D of sacral abscess 01/11/2019, Dr. Georganna Skeans  FEN: Renal/ carb modified diet ID: Fluconazole 12/31 >> day 2; linezolid 12/25-12/30 DVT: SCDs; can be on VTE prophylaxis today 1300 hrs from our standpoint Follow-up: [facility]  Plan:  We will start dressing changes today, teach the family and she can go home from our standpoint.  Defer Antibiotics to Medicine.  She can follow up in our office in about 2 weeks, I will ask the office to call and arrange.  Staff will teach the sister how to do dressing change and she has follow up info in the AVS.    LOS: 1 day    Susan Horton 01/12/2019 Please see Amion

## 2019-01-12 NOTE — Progress Notes (Signed)
Family Medicine Teaching Service Daily Progress Note Intern Pager: [phone]  Patient name: ANEESAH HERNAN Medical record number: [id number] Date of birth: Apr 23, 1970 Age: 49 y.o. Gender: female  Primary Care Provider: Charlott Rakes, MD Consultants: nephrology, ID, gen surgery  Code Status: Full  Pt Overview and Major Events to Date:  Admitted to [facility] 12/31  Assessment and Plan: JOSEFITA WEISSMANN is a 49 y.o. female presenting with pain in her buttock  found to have sacral abscess, now status post incision and drainage.  PMH is significant for ESRD on home PD, T2DM, HTN, CHF, PVD  Buttock pain 2/2 sacral abscess with h/o recent sacral phlegmon S/p I&D on 12/31.  Afebrile, VSS.  WBC decreasing to 16.  Pt receiving oxycodone and Tylenol for pain.  Surgery following.  Infectious disease consulted, will likely see patient today. -follow blood cultures -will hold linezolid (12/18-) per ID recs.   - starting vanc per pharm consult - f/u ESR and CRP -ID consulted, appreciate recommendations.  -surgery consulted, appreciate recommendations.  -scheduled tylenol q6h -pain control per surgery: oxycodone '5mg'$  q6h PRN -vitals per routine   Leukocytosis Improving. WBC 29.6>26.9> 16.0. Patient to f/u with hematology as outpt for bone marrow bx.   -outpatient f/u with hematology   Anemia Hgb decreasing since admission. 9.7>>7.6.  No source of bleeding other than site of I&D -continue to monitor - hold off on VTE prophylaxis now given her decreasing hgb and bc she is already on DAPT currently.   ESRD on home PD Uses home nightly peritoneal dialysis.  Home medications include PhosLo, calcitriol.  Phosphorus elevated this a.m. at 9.5.  Last measurement was 12/24. -Nephrology consulted, appreciate recommendations -Continue home medications  CAD/PAD Follows with [facility].History of significant peripheral artery disease and status post right SFA intervention by Dr. Carlis Abbott on 212/3/20.   Home meds include aspirin, Plavix, metoprolol.  Patient also with history of drug-eluting stent placed in 2018.  -Restarted aspirin and Plavix on 1/1. -Monitor for bleeding  HFpEF Also addressed by cardiology.  Recent echo on 12/11 showing EF 60 to 65% with normal left ventricular function.  Echo from 2019 showing EF 55 to 60% with G2DD.  Her medications include metoprolol & IMDUR -continue home meds   T2DM with peripheral neuropathy   AM CBG 228. Home meds include Toujeo 10U bid units as well as Humalog sliding scale insulin. -sSSI, adding BID lantus today at 10 U -monitor CBGs q4h  -continue amitriptyline   Gastroparesis  H/o gastroparesis with frequent vomiting. Home meds: reglan '5mg'$  tid. -continue home meds  HTN Remains hypertensive in the 140s today.  Home meds: toprol '50mg'$  daily  -continue home meds   Asthma Meds: Singulair as needed, Advair twice daily and Ventolin as needed. -continue home meds   FEN/GI: renal/carb modified    Prophylaxis: SCDs, restart pharm VTE ppx on 1/1   Disposition: Home with home health PT.  Subjective:  Patient feels well today.  States the pain comes and goes.  Does not think she had any pain medications this morning.  Objective: Temp:  [97.5 F (36.4 C)-98.7 F (37.1 C)] 97.8 F (36.6 C) (01/01 0630) Pulse Rate:  [66-97] 90 (01/01 0630) Resp:  [15-20] 18 (01/01 0630) BP: (92-156)/(55-97) 145/66 (01/01 0630) SpO2:  [95 %-100 %] 99 % (01/01 0630) Weight:  [78 kg-79.5 kg] 79.5 kg (01/01 0630) Physical Exam: General: Alert and oriented.  Sitting at side of bed.  Eating breakfast. Cardiovascular: Regular rate and rhythm.  No murmurs. Respiratory: No tachypnea, no increased work of breathing.  L CTA B Extremities: No edema Back: I&D site is packed with sterile gauze, skin around the area does not appear to be infected.  No purulent drainage.  Minimal blood on the bandage  Laboratory: Recent Labs  Lab 01/09/19 1922 01/11/19 0552  01/12/19 0520  WBC 29.6* 26.9* 16.0*  HGB 8.2* 8.3* 7.6*  HCT 26.2* 26.1* 23.5*  PLT 510* 461* 441*   Recent Labs  Lab 01/09/19 1922 01/11/19 0552 01/12/19 0520  NA 135 137 136  K 4.2 4.4 4.6  CL 92* 95* 95*  CO2 24 21* 23  BUN 93* 105* 104*  CREATININE 11.48* 11.85* 10.58*  CALCIUM 9.1 8.3* 8.3*  PROT 6.7  --   --   BILITOT 0.5  --   --   ALKPHOS 349*  --   --   ALT 20  --   --   AST 20  --   --   GLUCOSE 93 95 256*     Imaging/Diagnostic Tests: No new images   Benay Pike, MD 01/12/2019, 8:31 AM PGY-2, [place] Intern pager: [phone], text pages welcome

## 2019-01-12 NOTE — Progress Notes (Signed)
Pharmacy Antibiotic Note  Susan Horton is a 49 y.o. female admitted on 01/09/2019 with worsening sacral pain.  Pharmacy has been consulted for vancomycin dosing for wound infection. Patient underwent sacral abscess I&D on 12/31. Patient is ESRD on PD. Currently sacral wound culture no growth x1 day. WBC 16.0. Afebrile.   Plan: Vancomycin 1500mg  x1  Obtain vancomycin random level as appropriate  Monitor cultures/sensitivities   Height: 5\' 5"  (165.1 cm) Weight: 175 lb 4.3 oz (79.5 kg) IBW/kg (Calculated) : 57  Temp (24hrs), Avg:97.9 F (36.6 C), Min:97.5 F (36.4 C), Max:98.7 F (37.1 C)  Recent Labs  Lab 01/09/19 1922 01/10/19 1010 01/11/19 0552 01/12/19 0520  WBC 29.6*  --  26.9* 16.0*  CREATININE 11.48*  --  11.85* 10.58*  LATICACIDVEN  --  1.1  --   --     Estimated Creatinine Clearance: 6.8 mL/min (A) (by C-G formula based on SCr of 10.58 mg/dL (H)).    Allergies  Allergen Reactions  . Repatha [Evolocumab] Itching  . Lisinopril Cough    Antimicrobials this admission: Vancomycin 1/1 >>  Dose adjustments this admission: N/A  Microbiology results: 12/31 BCx x 2 >> ngtd 12/31 sacral abscess >> no growth 1 day; no organisms seen  12/21 sacral abscess >> neg  Thank you for allowing pharmacy to be a part of this patient's care.  Cristela Felt, PharmD PGY1 Pharmacy Resident [employer]: [phone]   01/12/2019 11:47 AM

## 2019-01-12 NOTE — Progress Notes (Signed)
[facility] Progress Note   Subjective:   Patient seen in room. Vomiting, reports this is normal for her due to gastroparesis. Denies SOB, orthopnea, CP, palpitations, abdominal pain, N/V/D. Actually prefers CAPD to CCPD.   Objective Vitals:   01/12/19 0010 01/12/19 0532 01/12/19 0630 01/12/19 0853  BP: 110/74 (!) 142/61 (!) 145/66 (!) 159/73  Pulse: 89 82 90 89  Resp: 15 20 18 18   Temp: 98 F (36.7 C) 97.8 F (36.6 C) 97.8 F (36.6 C) 97.8 F (36.6 C)  TempSrc: Oral Oral Oral Oral  SpO2: 99% 96% 99% 100%  Weight: 79.4 kg  79.5 kg   Height:       Physical Exam General: Well developed, well nourished female. Alert and in NAD Heart: Slightly tachycardic. No murmurs, rubs or gallops auscultated Lungs: CTA bilaterally without wheezing, rhonchi or rales Abdomen: Firm, non-tender, +BS. PD cath in place Extremities: 1-2+ edema b/l lower extremities Dialysis Access:  PD cath, LUE AVF   Additional Objective Labs: Basic Metabolic Panel: Recent Labs  Lab 01/09/19 1922 01/11/19 0552 01/12/19 0520  NA 135 137 136  K 4.2 4.4 4.6  CL 92* 95* 95*  CO2 24 21* 23  GLUCOSE 93 95 256*  BUN 93* 105* 104*  CREATININE 11.48* 11.85* 10.58*  CALCIUM 9.1 8.3* 8.3*  PHOS  --   --  9.5*   Liver Function Tests: Recent Labs  Lab 01/09/19 1922 01/12/19 0520  AST 20  --   ALT 20  --   ALKPHOS 349*  --   BILITOT 0.5  --   PROT 6.7  --   ALBUMIN 1.7* 1.6*   No results for input(s): LIPASE, AMYLASE in the last 168 hours. CBC: Recent Labs  Lab 01/09/19 1922 01/11/19 0552 01/12/19 0520  WBC 29.6* 26.9* 16.0*  NEUTROABS 26.2*  --  14.4*  HGB 8.2* 8.3* 7.6*  HCT 26.2* 26.1* 23.5*  MCV 87.9 87.6 86.7  PLT 510* 461* 441*   Blood Culture    Component Value Date/Time   SDES ABSCESS SACRAL 01/11/2019 0935   SPECREQUEST PATIENT ON FOLLOWING LINZOLID 01/11/2019 0935   CULT PENDING 01/11/2019 0935   REPTSTATUS PENDING 01/11/2019 0935    Cardiac Enzymes: No results  for input(s): CKTOTAL, CKMB, CKMBINDEX, TROPONINI in the last 168 hours. CBG: Recent Labs  Lab 01/11/19 1631 01/11/19 2001 01/12/19 0007 01/12/19 0337 01/12/19 0718  GLUCAP 133* 249* 239* 274* 228*   Iron Studies: No results for input(s): IRON, TIBC, TRANSFERRIN, FERRITIN in the last 72 hours. @lablastinr3 @ Studies/Results: MR PELVIS WO CONTRAST  Result Date: 01/10/2019 CLINICAL DATA:  Presacral abscess drainage January 01, 2019. Pain in the pelvis. History of perirectal abscess. EXAM: MRI PELVIS WITHOUT CONTRAST TECHNIQUE: Multiplanar multisequence MR imaging of the pelvis was performed. No intravenous contrast was administered. COMPARISON:  12/31/2018 FINDINGS: Nonstandard positioning was employed due to the patient's pain. Urinary Tract:  Urinary bladder unremarkable. Bowel:  No dilated bowel in the pelvis. Vascular/Lymphatic: No pathologic regional adenopathy observed. Reproductive:  Prior hysterectomy. Adnexa unremarkable. Other: Moderate pelvic ascites, new compared to the 12/31/2018 exam. Small amount of ascites along the right paracolic gutter. Musculoskeletal: Extending under the tip of the coccyx but muscle along the posterior superficial margin of the coccyx and lower sacrum, we demonstrate a 7.0 by 4.9 by 8.3 cm (volume = 150 cm^3) abnormal fluid collection compatible with abscess. This has increased in volume compared to previous and likely represents some reaccumulation or reforming of the abscess. This partially extends  along the medial margins of the gluteus maximus muscles, which demonstrate adjacent edema compatible with myositis. There is likely some myositis involving the superficial posterior portions of the spinalis thoracis muscles behind the sacrum. There is only a small amount of presacral edema, and no drainable collection anterior to the sacrum at this time. Much of the current abscess is very superficial within the subcutaneous tissues along the posteroinferior margin of  the intergluteal cleft. There is subcutaneous edema along the lower anterior abdominal wall, and tracking anteriorly in the right thigh. IMPRESSION: 1. Interval increase in size of the abscess along the posterior superficial margin of the coccyx and lower sacrum, now measuring 150 cubic cm. 2. Moderate pelvic ascites, new compared to 12/20/20202020 exam. 3. There is some myositis involving the superficial posterior portions of the spinalis thoracis muscles behind the sacrum, and of the medial gluteus maximus muscles adjacent to the abscess. 4. There is some subcutaneous edema along the lower anterior abdominal wall and tracking anteriorly in the right thigh. Electronically Signed   By: Van Clines M.D.   On: 01/10/2019 18:34   Medications: . sodium chloride 10 mL/hr at 01/11/19 0859  . dialysis solution 1.5% low-MG    . dialysis solution 1.5% low-MG/low-CA     . acetaminophen  650 mg Oral Q6H   Or  . acetaminophen  650 mg Rectal Q6H  . amitriptyline  10 mg Oral QHS  . aspirin EC  81 mg Oral Daily  . calcitRIOL  1.5 mcg Oral Daily  . calcium acetate  1,334 mg Oral With snacks  . calcium acetate  2,001 mg Oral TID WC  . clopidogrel  75 mg Oral Daily  . ferrous sulfate  325 mg Oral Daily  . fluticasone furoate-vilanterol  1 puff Inhalation Daily  . gentamicin cream  1 application Topical QHS  . insulin aspart  0-9 Units Subcutaneous Q4H  . isosorbide mononitrate  15 mg Oral Daily  . lidocaine-prilocaine  1 application Topical Q M,W,F-HD  . metoCLOPramide  5 mg Oral TID AC & HS  . metoprolol succinate  50 mg Oral Daily  . montelukast  10 mg Oral QHS  . multivitamin with minerals  1 tablet Oral Daily  . pantoprazole  40 mg Oral Daily  . polyethylene glycol  17 g Oral BID  . pravastatin  40 mg Oral q1800  . pregabalin  75 mg Oral Daily  . senna-docusate  1 tablet Oral BID  . umeclidinium bromide  1 puff Inhalation Daily    Assessment/Plan: 1.Sacral, coccygeal and gluteal  abscesses:Status post aspiration of perisacral fluid collection on 12/31 with negative Gram stain and cultures. On antibiotic therapy with linezolid (narrowed down from aztreonam/vancomycin/Flagyl). 2. ESRD:on PD followed by Dr. Jimmy Footman CCPD 4 exchanges with 2L at night 2 1.5% and 1 2.5% with 5th fill of 1L which she drains at midday.  Continue peritoneal dialysis no ccpd machines available so using CAPD which patient prefers.  No evidence of PD peritonitis based on exam. Previous imaging does not show communication of abscess with peritoneum.Will continue CAPD until CCPD machine available. Cr trending back dow, 10.58 this AM.  3.Hypertension/ Volume = + edema and BP up, use mix of 1.5% and 4.25% for volume. 4. Anemia:Hemoglobin down trending to 7.6 post-op. Will order aransep- received here during last admission.  5. CKD-MBD:Calcium level currently at goal with rising phosphorus level to 9.5. Increase calcium acetate to 4/meal and 2/snack. 6. Nutrition:alb 1.7 Continue current diet with ONS/protein supplementation to  optimize wound healing/PD losses. She has chronic gastroparesis on metoclopramide that further compromise hernutritional status. 7. QUR:BHQGQC overloaded as above 8. DM insulin per primary team  Anice Paganini, PA-C 01/12/2019, 10:04 AM  [facility] Pager: [phone]

## 2019-01-12 NOTE — Progress Notes (Signed)
Physical Therapy Treatment Patient Details Name: Susan Horton MRN: [id number] DOB: [birth year] Today's Date: 01/12/2019    History of Present Illness 49 y/o female who presents with Pain in her lower back, was found to have sacral abscess and received I and D on 12/31.  PMHx:  leukocytosis, N&V, gastroparesis, DM, PD from ESRD, CHF, PVD, HTN, PAD with R SAF 12/14/18, anemia, PN, SOB, LLE pain and spasms    PT Comments    Pt was able to increase gait to 200' with RW and min guard, does have some buckling of knees but recovers independently.  Pt is motivated to improve and get stronger.  Continues to have weakness and c/o weakness in bil LE (L worse than R).  Discussed likely multi-factoral in nature.      Follow Up Recommendations  Home health PT;Supervision for mobility/OOB     Equipment Recommendations  Rolling walker with 5" wheels    Recommendations for Other Services       Precautions / Restrictions Precautions Precautions: Fall    Mobility  Bed Mobility Overal bed mobility: Needs Assistance Bed Mobility: Supine to Sit;Sit to Supine   Sidelying to sit: Supervision Supine to sit: Supervision     General bed mobility comments: use of rail  Transfers Overall transfer level: Needs assistance Equipment used: Rolling walker (2 wheeled) Transfers: Sit to/from Stand Sit to Stand: Min guard         General transfer comment: cues for safe hand placement - performed x 3 for transfers (plus more ther ex)  Ambulation/Gait Ambulation/Gait assistance: Min guard Gait Distance (Feet): 200 Feet Assistive device: Rolling walker (2 wheeled) Gait Pattern/deviations: Step-through pattern;Wide base of support Gait velocity: decreased   General Gait Details: slow and cautious gait; reports feels weak on L LE and had 3 episodes of light buckling recovery independently   Stairs             Wheelchair Mobility    Modified Rankin (Stroke Patients Only)       Balance  Overall balance assessment: Needs assistance Sitting-balance support: Feet supported;No upper extremity supported Sitting balance-Leahy Scale: Normal     Standing balance support: Bilateral upper extremity supported;During functional activity Standing balance-Leahy Scale: Fair                              Cognition Arousal/Alertness: Awake/alert Behavior During Therapy: WFL for tasks assessed/performed Overall Cognitive Status: Within Functional Limits for tasks assessed                                        Exercises Other Exercises Other Exercises: Sit to stand x 10-bed slightly elevated , min guard Other Exercises: Standing Marching with RW, cues for safety, and min guard; x 10 Other Exercises: Heel raises x 10 with RW and min guard    General Comments General comments (skin integrity, edema, etc.): Pt reports concern over why her legs buckle/jerk/weak.  Pt reports buckling and jerking in legs since infection started in mid December, but did have some weakness and burning into legs previously from vascular issues (had stent early December).  Additionally, pt with ESRD.  Discussed weakness is likely multi-factoral: vascular issues, ESRD with elevated BUN and creatine, recent surgery and infection.  Pt asking if will improve - discussed unable to predict but is demonstrating progress and will continue  to progress as able.  Bil LE MMT was 4/5 with jerking/unsteady loss of control.       Pertinent Vitals/Pain Pain Assessment: 0-10 Pain Score: 3  Pain Location: sacral surgery site Pain Intervention(s): Limited activity within patient's tolerance;Repositioned    Home Living                      Prior Function            PT Goals (current goals can now be found in the care plan section) Progress towards PT goals: Progressing toward goals    Frequency    Min 3X/week      PT Plan Current plan remains appropriate    Co-evaluation               AM-PAC PT "6 Clicks" Mobility   Outcome Measure  Help needed turning from your back to your side while in a flat bed without using bedrails?: None Help needed moving from lying on your back to sitting on the side of a flat bed without using bedrails?: None Help needed moving to and from a bed to a chair (including a wheelchair)?: None Help needed standing up from a chair using your arms (e.g., wheelchair or bedside chair)?: None Help needed to walk in hospital room?: None Help needed climbing 3-5 steps with a railing? : A Lot 6 Click Score: 22    End of Session Equipment Utilized During Treatment: Gait belt Activity Tolerance: Patient tolerated treatment well Patient left: in bed;with call bell/phone within reach(sitting EOB for dinner; pt stable) Nurse Communication: Mobility status PT Visit Diagnosis: Unsteadiness on feet (R26.81);Muscle weakness (generalized) (M62.81);Difficulty in walking, not elsewhere classified (R26.2);Pain     Time: 4982-6415 PT Time Calculation (min) (ACUTE ONLY): 30 min  Charges:  $Gait Training: 8-22 mins $Therapeutic Exercise: 8-22 mins                     Maggie Font, PT Acute Rehab Services Pager [phone] [facility] [phone]    Karlton Lemon 01/12/2019, 5:32 PM

## 2019-01-12 NOTE — Plan of Care (Signed)
  Problem: Education: Goal: Knowledge of General Education information will improve Description: Including pain rating scale, medication(s)/side effects and non-pharmacologic comfort measures Outcome: Progressing   Problem: Health Behavior/Discharge Planning: Goal: Ability to manage health-related needs will improve Outcome: Progressing   Problem: Clinical Measurements: Goal: Will remain free from infection Outcome: Progressing   Problem: Pain Managment: Goal: General experience of comfort will improve Outcome: Progressing   Problem: Skin Integrity: Goal: Risk for impaired skin integrity will decrease Outcome: Progressing   

## 2019-01-12 NOTE — Progress Notes (Signed)
1 Day Post-Op abscess I&D Subjective: Feels sore but much better than before  Objective: Vital signs in last 24 hours: Temp:  [97.5 F (36.4 C)-98.7 F (37.1 C)] 97.8 F (36.6 C) (01/01 0853) Pulse Rate:  [82-91] 89 (01/01 0853) Resp:  [15-20] 18 (01/01 0853) BP: (110-159)/(55-96) 159/73 (01/01 0853) SpO2:  [96 %-100 %] 100 % (01/01 0853) Weight:  [78.8 kg-79.5 kg] 79.5 kg (01/01 0630)   Intake/Output from previous day: 12/31 0701 - 01/01 0700 In: 730 [P.O.:480; I.V.:250] Out: 910 [Emesis/NG output:900; Blood:10] Intake/Output this shift: No intake/output data recorded.   General appearance: alert and cooperative wound packed and dressed  Incision: no significant drainage  Lab Results:  Recent Labs    01/11/19 0552 01/12/19 0520  WBC 26.9* 16.0*  HGB 8.3* 7.6*  HCT 26.1* 23.5*  PLT 461* 441*   BMET Recent Labs    01/11/19 0552 01/12/19 0520  NA 137 136  K 4.4 4.6  CL 95* 95*  CO2 21* 23  GLUCOSE 95 256*  BUN 105* 104*  CREATININE 11.85* 10.58*  CALCIUM 8.3* 8.3*   PT/INR Recent Labs    01/09/19 1922  LABPROT 14.4  INR 1.1   ABG No results for input(s): PHART, HCO3 in the last 72 hours.  Invalid input(s): PCO2, PO2  MEDS, Scheduled . acetaminophen  650 mg Oral Q6H   Or  . acetaminophen  650 mg Rectal Q6H  . amitriptyline  10 mg Oral QHS  . aspirin EC  81 mg Oral Daily  . calcitRIOL  1.5 mcg Oral Daily  . calcium acetate  1,334 mg Oral With snacks  . calcium acetate  2,668 mg Oral TID WC  . clopidogrel  75 mg Oral Daily  . darbepoetin (ARANESP) injection - NON-DIALYSIS  40 mcg Subcutaneous Q Fri-1800  . ferrous sulfate  325 mg Oral Daily  . fluticasone furoate-vilanterol  1 puff Inhalation Daily  . gentamicin cream  1 application Topical QHS  . insulin aspart  0-9 Units Subcutaneous Q4H  . isosorbide mononitrate  15 mg Oral Daily  . lidocaine-prilocaine  1 application Topical Q M,W,F-HD  . metoCLOPramide  5 mg Oral TID AC & HS  .  metoprolol succinate  50 mg Oral Daily  . montelukast  10 mg Oral QHS  . multivitamin with minerals  1 tablet Oral Daily  . pantoprazole  40 mg Oral Daily  . polyethylene glycol  17 g Oral BID  . pravastatin  40 mg Oral q1800  . pregabalin  75 mg Oral Daily  . senna-docusate  1 tablet Oral BID  . umeclidinium bromide  1 puff Inhalation Daily    Studies/Results: MR PELVIS WO CONTRAST  Result Date: 01/10/2019 CLINICAL DATA:  Presacral abscess drainage January 01, 2019. Pain in the pelvis. History of perirectal abscess. EXAM: MRI PELVIS WITHOUT CONTRAST TECHNIQUE: Multiplanar multisequence MR imaging of the pelvis was performed. No intravenous contrast was administered. COMPARISON:  12/31/2018 FINDINGS: Nonstandard positioning was employed due to the patient's pain. Urinary Tract:  Urinary bladder unremarkable. Bowel:  No dilated bowel in the pelvis. Vascular/Lymphatic: No pathologic regional adenopathy observed. Reproductive:  Prior hysterectomy. Adnexa unremarkable. Other: Moderate pelvic ascites, new compared to the 12/31/2018 exam. Small amount of ascites along the right paracolic gutter. Musculoskeletal: Extending under the tip of the coccyx but muscle along the posterior superficial margin of the coccyx and lower sacrum, we demonstrate a 7.0 by 4.9 by 8.3 cm (volume = 150 cm^3) abnormal fluid collection compatible with abscess.  This has increased in volume compared to previous and likely represents some reaccumulation or reforming of the abscess. This partially extends along the medial margins of the gluteus maximus muscles, which demonstrate adjacent edema compatible with myositis. There is likely some myositis involving the superficial posterior portions of the spinalis thoracis muscles behind the sacrum. There is only a small amount of presacral edema, and no drainable collection anterior to the sacrum at this time. Much of the current abscess is very superficial within the subcutaneous tissues  along the posteroinferior margin of the intergluteal cleft. There is subcutaneous edema along the lower anterior abdominal wall, and tracking anteriorly in the right thigh. IMPRESSION: 1. Interval increase in size of the abscess along the posterior superficial margin of the coccyx and lower sacrum, now measuring 150 cubic cm. 2. Moderate pelvic ascites, new compared to 12/20/20202020 exam. 3. There is some myositis involving the superficial posterior portions of the spinalis thoracis muscles behind the sacrum, and of the medial gluteus maximus muscles adjacent to the abscess. 4. There is some subcutaneous edema along the lower anterior abdominal wall and tracking anteriorly in the right thigh. Electronically Signed   By: Van Clines M.D.   On: 01/10/2019 18:34    Assessment: s/p Procedure(s): INCISION AND DRAINAGE SACRAL ABSCESS Patient Active Problem List   Diagnosis Date Noted  . Abscess of sacrum ([place])   . Peritonitis ([place])   . Gluteal abscess 12/25/2018  . Symptomatic anemia 09/16/2018  . Peripheral vascular disease, unspecified ([place]) 09/04/2018  . ESRD on dialysis ([place]) 10/11/2017  . Chronic diastolic CHF (congestive heart failure) ([place]) 06/18/2017  . Moderate persistent asthma 06/10/2017  . CKD (chronic kidney disease) stage 4, GFR 15-29 ml/min ([facility])   . CAD S/P mLAD PCI with DES 08/06/2016  . Presence of drug coated stent in LAD coronary artery 08/06/2016  . Carotid bruit present 06/15/2016  . Dyslipidemia 06/15/2016  . Smoker 06/15/2016  . Neuropathy 03/10/2016  . Essential hypertension, benign 12/11/2015  . Gastroparesis   . Uncontrolled type 2 diabetes mellitus with peripheral neuropathy ([place]) 06/05/2014    Appears to be improving  Plan: sister coming in today to watch dressing change   LOS: 1 day     .Rosario Adie, MD [facility], [place]    01/12/2019 10:40 AM

## 2019-01-12 NOTE — Plan of Care (Signed)
  Problem: Education: Goal: Knowledge of General Education information will improve Description: Including pain rating scale, medication(s)/side effects and non-pharmacologic comfort measures Outcome: Progressing   Problem: Clinical Measurements: Goal: Ability to maintain clinical measurements within normal limits will improve Outcome: Progressing   

## 2019-01-13 ENCOUNTER — Inpatient Hospital Stay (HOSPITAL_COMMUNITY): Payer: Medicare Other

## 2019-01-13 DIAGNOSIS — R531 Weakness: Secondary | ICD-10-CM

## 2019-01-13 DIAGNOSIS — Z4932 Encounter for adequacy testing for peritoneal dialysis: Secondary | ICD-10-CM | POA: Diagnosis not present

## 2019-01-13 DIAGNOSIS — D509 Iron deficiency anemia, unspecified: Secondary | ICD-10-CM | POA: Diagnosis not present

## 2019-01-13 DIAGNOSIS — E440 Moderate protein-calorie malnutrition: Secondary | ICD-10-CM | POA: Diagnosis not present

## 2019-01-13 DIAGNOSIS — Z992 Dependence on renal dialysis: Secondary | ICD-10-CM | POA: Diagnosis not present

## 2019-01-13 DIAGNOSIS — N2581 Secondary hyperparathyroidism of renal origin: Secondary | ICD-10-CM | POA: Diagnosis not present

## 2019-01-13 DIAGNOSIS — N186 End stage renal disease: Secondary | ICD-10-CM | POA: Diagnosis not present

## 2019-01-13 LAB — RENAL FUNCTION PANEL
Albumin: 1.8 g/dL — ABNORMAL LOW (ref 3.5–5.0)
Anion gap: 19 — ABNORMAL HIGH (ref 5–15)
BUN: 101 mg/dL — ABNORMAL HIGH (ref 6–20)
CO2: 24 mmol/L (ref 22–32)
Calcium: 8.3 mg/dL — ABNORMAL LOW (ref 8.9–10.3)
Chloride: 93 mmol/L — ABNORMAL LOW (ref 98–111)
Creatinine, Ser: 10.31 mg/dL — ABNORMAL HIGH (ref 0.44–1.00)
GFR calc Af Amer: 5 mL/min — ABNORMAL LOW (ref >60)
GFR calc non Af Amer: 4 mL/min — ABNORMAL LOW (ref >60)
Glucose, Bld: 131 mg/dL — ABNORMAL HIGH (ref 70–99)
Phosphorus: 7.8 mg/dL — ABNORMAL HIGH (ref 2.5–4.6)
Potassium: 4.1 mmol/L (ref 3.5–5.1)
Sodium: 136 mmol/L (ref 135–145)

## 2019-01-13 LAB — CBC WITH DIFFERENTIAL/PLATELET
Abs Immature Granulocytes: 0.16 10*3/uL — ABNORMAL HIGH (ref 0.00–0.07)
Basophils Absolute: 0.1 10*3/uL (ref 0.0–0.1)
Basophils Relative: 0 %
Eosinophils Absolute: 0.2 10*3/uL (ref 0.0–0.5)
Eosinophils Relative: 1 %
HCT: 26.8 % — ABNORMAL LOW (ref 36.0–46.0)
Hemoglobin: 8.3 g/dL — ABNORMAL LOW (ref 12.0–15.0)
Immature Granulocytes: 1 %
Lymphocytes Relative: 13 %
Lymphs Abs: 2.4 10*3/uL (ref 0.7–4.0)
MCH: 27.6 pg (ref 26.0–34.0)
MCHC: 31 g/dL (ref 30.0–36.0)
MCV: 89 fL (ref 80.0–100.0)
Monocytes Absolute: 1.7 10*3/uL — ABNORMAL HIGH (ref 0.1–1.0)
Monocytes Relative: 9 %
Neutro Abs: 14.3 10*3/uL — ABNORMAL HIGH (ref 1.7–7.7)
Neutrophils Relative %: 76 %
Platelets: 455 10*3/uL — ABNORMAL HIGH (ref 150–400)
RBC: 3.01 MIL/uL — ABNORMAL LOW (ref 3.87–5.11)
RDW: 14.9 % (ref 11.5–15.5)
WBC: 18.8 10*3/uL — ABNORMAL HIGH (ref 4.0–10.5)
nRBC: 0.1 % (ref 0.0–0.2)

## 2019-01-13 LAB — GLUCOSE, CAPILLARY
Glucose-Capillary: 121 mg/dL — ABNORMAL HIGH (ref 70–99)
Glucose-Capillary: 122 mg/dL — ABNORMAL HIGH (ref 70–99)
Glucose-Capillary: 74 mg/dL (ref 70–99)
Glucose-Capillary: 122 mg/dL — ABNORMAL HIGH (ref 70–99)

## 2019-01-13 MED ORDER — CALCIUM ACETATE (PHOS BINDER) 667 MG PO CAPS
2001.0000 mg | ORAL_CAPSULE | ORAL | Status: DC
  Administered 2019-01-13: 2001 mg via ORAL
  Filled 2019-01-13: qty 3

## 2019-01-13 MED ORDER — ERYTHROMYCIN BASE 250 MG PO TABS
250.0000 mg | ORAL_TABLET | Freq: Three times a day (TID) | ORAL | Status: DC
  Administered 2019-01-13 – 2019-01-14 (×3): 250 mg via ORAL
  Filled 2019-01-13 (×5): qty 1

## 2019-01-13 MED ORDER — DARBEPOETIN ALFA 60 MCG/0.3ML IJ SOSY: 60.0000 ug | PREFILLED_SYRINGE | INTRAMUSCULAR | Status: DC

## 2019-01-13 MED ORDER — VANCOMYCIN VARIABLE DOSE PER UNSTABLE RENAL FUNCTION (PHARMACIST DOSING): Status: DC

## 2019-01-13 NOTE — Progress Notes (Signed)
Pt refused to do PD tonight. Charge Nurse Otila Kluver notified.

## 2019-01-13 NOTE — Progress Notes (Signed)
FPTS Interim Progress Note  Patient continues to have nausea and vomiting despite being on Reglan.  I discussed this with pharmacy team who went through algorithm for gastroparesis induced vomiting.  Per algorithm patient should start erythromycin 250 mg 3 times daily given worsening vomiting despite being on Reglan.  If worsen can consider adding on mirtazapine.  Erythromycin started, will need to monitor QTC closely. AM EKG ordered  Caroline More, DO 01/13/2019, 2:07 PM PGY-3, [place] [facility] Service pager [phone]

## 2019-01-13 NOTE — Progress Notes (Signed)
Patient ID: Susan Horton, female   DOB: [birth year], 49 y.o.   MRN: [id number]    2 Days Post-Op  Subjective: Patient feels ok today, but states she feels weak in her legs and has some hand tremors.  No issues with her wound.  Her sister packed it yesterday.  ROS: See above, otherwise other systems negative  Objective: Vital signs in last 24 hours: Temp:  [97.6 F (36.4 C)-98.2 F (36.8 C)] 98.2 F (36.8 C) (01/02 0900) Pulse Rate:  [73-102] 98 (01/02 0504) Resp:  [16-18] 18 (01/02 0900) BP: (134-184)/(74-93) 134/77 (01/02 0900) SpO2:  [97 %-99 %] 98 % (01/02 0900) Weight:  [78 kg-82.5 kg] 82.5 kg (01/01 2133) Last BM Date: 01/11/19  Intake/Output from previous day: 01/01 0701 - 01/02 0700 In: 860 [P.O.:860] Out: 0  Intake/Output this shift: Total I/O In: 240 [P.O.:240] Out: 0   PE: Skin: sacral wound is clean and packed.  No purulent drainage noted.  No surrounding erythema or induration noted.  Lab Results:  Recent Labs    01/12/19 0520 01/13/19 0612  WBC 16.0* 18.8*  HGB 7.6* 8.3*  HCT 23.5* 26.8*  PLT 441* 455*   BMET Recent Labs    01/12/19 0520 01/13/19 0612  NA 136 136  K 4.6 4.1  CL 95* 93*  CO2 23 24  GLUCOSE 256* 131*  BUN 104* 101*  CREATININE 10.58* 10.31*  CALCIUM 8.3* 8.3*   PT/INR No results for input(s): LABPROT, INR in the last 72 hours. CMP     Component Value Date/Time   NA 136 01/13/2019 0612   NA 132 (L) 07/26/2016 0000   K 4.1 01/13/2019 0612   CL 93 (L) 01/13/2019 0612   CO2 24 01/13/2019 0612   GLUCOSE 131 (H) 01/13/2019 0612   BUN 101 (H) 01/13/2019 0612   BUN 39 (H) 07/26/2016 0000   CREATININE 10.31 (H) 01/13/2019 0612   CALCIUM 8.3 (L) 01/13/2019 0612   CALCIUM 8.4 (L) 06/21/2017 1939   PROT 6.7 01/09/2019 1922   PROT 6.7 07/21/2017 0959   ALBUMIN 1.8 (L) 01/13/2019 0612   ALBUMIN 3.2 (L) 07/21/2017 0959   AST 20 01/09/2019 1922   ALT 20 01/09/2019 1922   ALKPHOS 349 (H) 01/09/2019 1922   BILITOT 0.5 01/09/2019  1922   BILITOT <0.2 07/21/2017 0959   GFRNONAA 4 (L) 01/13/2019 0612   GFRAA 5 (L) 01/13/2019 0612   Lipase     Component Value Date/Time   LIPASE 15 12/25/2018 1115       Studies/Results: No results found.  Anti-infectives: Anti-infectives (From admission, onward)   Start     Dose/Rate Route Frequency Ordered Stop   01/12/19 1230  vancomycin (VANCOREADY) IVPB 1500 mg/300 mL     1,500 mg 150 mL/hr over 120 Minutes Intravenous  Once 01/12/19 1218 01/12/19 1615   01/11/19 1200  fluconazole (DIFLUCAN) tablet 150 mg     150 mg Oral  Once 01/11/19 1157 01/11/19 1320   01/10/19 2200  linezolid (ZYVOX) tablet 600 mg  Status:  Discontinued     600 mg Oral 2 times daily 01/10/19 1908 01/10/19 1914       Assessment/Plan End-stage renal disease -peritoneal dialysis at home CAD/DES stents -Plavix/aspirin PVD Type 2 diabetes Gastroparesis Hypertension Asthma  Sacral abscess I&D of sacral abscess 01/11/2019, Dr. Georganna Skeans -wound is stable and clean -cont BID dressing changes -stable for DC home from surgical standpoint when medically stable -CX shows no growth to date -source is controlled.  Doubt any further abx therapy necessary for sacral wound, but defer to primary service/ID if felt warranted  FEN: Renal/ carb modified diet ID: Fluconazole 12/31 >> day 2; linezolid 12/25-12/30 DVT: SCDs; can be on VTE prophylaxis today 1300 hrs from our standpoint Follow-up: [facility]  We will sign off at this time.   LOS: 2 days    Henreitta Cea , [facility] 01/13/2019, 10:19 AM Please see Amion for pager number during day hours 7:00am-4:30pm

## 2019-01-13 NOTE — Progress Notes (Signed)
Drained PD fluid this am. Patient declined to be filled will advise oncoming shift to try again later today.

## 2019-01-13 NOTE — Progress Notes (Addendum)
Family Medicine Teaching Service Daily Progress Note Intern Pager: [phone]  Patient name: Susan Horton Medical record number: [id number] Date of birth: 05-Aug-1970 Age: 49 y.o. Gender: female  Primary Care Provider: Charlott Rakes, MD Consultants: Gen Surgery, nephrology, ID Code Status: Full   Pt Overview and Major Events to Date:  Admitted to [facility] 12/30 I&D in OR 12/31  Assessment and Plan: Susan L Jonesis a 49 y.o.femalepresenting with pain in her buttock found to have sacral abscess, now status post incision and drainage.  PMH is significant for ESRD on home PD, T2DM, HTN, CHF, PVD  Buttock pain2/2 sacral abscesswith h/o recent sacralphlegmon POD#2 from I&D on 12/31. Remains afebrile with stable VS. Pt receiving oxycodone and Tylenol for pain.  Surgery following, recommended dressing changes and dc from surgical standpoint. Will need f/u in 2 weeks.  ID consulted, stated if cultures are negative could discharge with zyvox 600mg  q12hrs. Did not think patient has osteomyelitis. F/u on 1/15 at [facility]. Cx remain NGTD. CRP 22.8. Sed rate 132. Patient reports improvement in pain.  -continue vancomycin  -ID consulted, appreciate recommendations.  -surgeryconsulted, appreciate recommendations.  -scheduled tylenol q6h -pain control per surgery: oxycodone 5mg  q6h PRN -vitals per routine  Tremor and weakness Patient reports new bilateral hand tremor as well as LE weakness. LE weakness was present prior to admission. Patient is very worried of the symptoms as he states she has an aunt that had a stroke and was not aware of this.  States she does not know if he is having a stroke but is scared of the possibility given her significant medical history.  Tremor appears to be a resting tremor as resolves with use of hand.  Neuro exam within normal limits today.  Unclear etiology at this time. -MRI brain -will consult neurology if MRI is positive -Continue to  monitor  Leukocytosis Worsening today, 16>18.8. Patient to f/u with hematology as outpt for bone marrow bx.   -outpatient f/u with hematology   Anemia Improved 7.6>8.3.   -continue to monitor -holding VTE prophylaxis now given her decreasing hgb, patient on DAPT currently.   ESRD on home PD Uses home nightly peritoneal dialysis. Home medications include PhosLo, calcitriol.  Phosphorus elevated this a.m. at 9.5.  Last measurement was 12/24. -Nephrology consulted, appreciate recommendations -Continue home medications  CAD/PAD Follows with [facility]. Patient also with history of drug-eluting stent placed in 2018. Home meds include aspirin, Plavix, metoprolol.  -continue aspirin and Plavix  -Monitor for bleeding  HFpEF Recent echo on 12/11 showing EF 60 to 65% with normal left ventricularfunction. Echo from 2019 showing EF 55 to 60% with G2DD. Her medications include metoprolol & IMDUR -continue home meds  T2DM with peripheral neuropathy AM CBG 121. Home meds include Toujeo 10U bidunits as well as Humalog sliding scale insulin. -sSSI -lantus 10U bid -monitor CBGs  -continue amitriptyline   Gastroparesis  H/o gastroparesis with frequent vomiting. Home meds: reglan 5mg  tid. -continue home meds  HTN Currently normotensive at 139/79. Home meds: toprol 50mg  daily -continue home meds  Asthma Meds: Singulair as needed, Advair twice daily and Ventolin as needed. -continue home meds  FEN/GI:renal/carb modified  Prophylaxis:plavix & ASA   Disposition: awaiting culture results   Subjective:  Patient states overall she feels improved with her buttock pain.  Patient's only concern today is her new tremor.  Reports tremor in hands bilaterally.  Is not sure what is going on but wants to find out.  States she recently has  been having lower extremity weakness as well.  This was prior to admission.  States that she has to use a motorized scooter when  she was a grocery store with her sister.  She is very worried because she says she is young but now she has significant medical problems which may need to have a stroke.  Objective: Temp:  [97.6 F (36.4 C)-98 F (36.7 C)] 98 F (36.7 C) (01/02 0504) Pulse Rate:  [73-102] 98 (01/02 0504) Resp:  [16-18] 16 (01/02 0504) BP: (139-184)/(74-93) 139/79 (01/02 0629) SpO2:  [97 %-99 %] 97 % (01/02 0504) Weight:  [78 kg-82.5 kg] 82.5 kg (01/01 2133) Physical Exam: General: awake and alert, laying in bed, able to stand without difficulty  Cardiovascular: RRR, no MRG  Respiratory: CTAB, no wheezes, rales, or rhonchi  Abdomen: soft, non tender, non distend, bowel sounds present  Extremities: no edema Back: dressing intact Neuro: resting tremor in hands, no focal deficits, sensation intact in UE, diminished in LE but she states this is chronic 2/2 to neuropathy. Normal strength bilaterally, normal grip strength   Laboratory: Recent Labs  Lab 01/11/19 0552 01/12/19 0520 01/13/19 0612  WBC 26.9* 16.0* 18.8*  HGB 8.3* 7.6* 8.3*  HCT 26.1* 23.5* 26.8*  PLT 461* 441* 455*   Recent Labs  Lab 01/09/19 1922 01/11/19 0552 01/12/19 0520 01/13/19 0612  NA 135 137 136 136  K 4.2 4.4 4.6 4.1  CL 92* 95* 95* 93*  CO2 24 21* 23 24  BUN 93* 105* 104* 101*  CREATININE 11.48* 11.85* 10.58* 10.31*  CALCIUM 9.1 8.3* 8.3* 8.3*  PROT 6.7  --   --   --   BILITOT 0.5  --   --   --   ALKPHOS 349*  --   --   --   ALT 20  --   --   --   AST 20  --   --   --   GLUCOSE 93 95 256* 131*     Imaging/Diagnostic Tests: No new imaging   Caroline More, DO 01/13/2019, 9:59 AM PGY-3, [place] Intern pager: [phone], text pages welcome

## 2019-01-13 NOTE — Progress Notes (Addendum)
Subjective:  Seen in room , washing hands and face standing at sink.Tolerating CAPD but reports some Nausea ( "my gastroparesis  acts up")if fluid stays in longer than 6 hours / reports bilat hand tremors this am  Noted admit team wu   Objective Vital signs in last 24 hours: Vitals:   01/13/19 0439 01/13/19 0504 01/13/19 0629 01/13/19 0900  BP: (!) 184/93 (!) 178/88 139/79 134/77  Pulse: (!) 102 98    Resp: 16 16  18   Temp: 97.6 F (36.4 C) 98 F (36.7 C)  98.2 F (36.8 C)  TempSrc: Oral Oral  Oral  SpO2: 97% 97%  98%  Weight:      Height:       Weight change: 0 kg  Physical Exam: General: alert Female NAD Heart: RRR, no m,r, g  Lungs: CTA Abdomen: BS pos , soft , NT, ND, PD cath in palce Extremities: bi pedal edema, 1+  Dialysis Access: LUA AVF , pos bruit ,pd cath    OP  CCPD  Dr Deterding = CCPD 4 exchanges with 2L at night 2 1.5% and 1 2.5% with 5th fill of 1L which she drains at midday.  Problem/Plan: 1.Sacral, coccygeal and gluteal abscesses:Status post aspiration of perisacral fluid collection on 12/21 with negative Gram stain and cultures. Then need for  01/11/19 CCS  ID SACRAL ABSCESS / on antibiotic therapy with ID Following  /  2. ESRD:on  OP CCPD  Continue peritoneal dialysis no ccpd machines available so capd  And   no evidence of PD peritonitis based on exam. Previous imaging does not show communication of abscess with peritoneum.Will continue CAPD  untill CCPD mach avail.  AM lab - BUN 101  Cr 10.31, k 4. 1 na 136  Use 4.2 alternate with 2.5 % bags  With some vol ^ and BUN ^   3.Hypertension/ Volume = some excess vol on exam / bp mildly up  Use 2.5 %/ 4.25%  pd bags  4. Anemia:hgb 8.3 Started Aranesp 60 mcg  01/01 /21 Subcutan. 5. CKD-MBD:Calcium level currently at goal with rising phosphorus level.6.8  continue phos binders and incr 1 to 2 ac  6. Nutrition:alb 1.7 Continue current diet with ONS/protein supplementation to optimize wound healing/PD  losses. She has chronic gastroparesis on metoclopramide that further compromise hernutritional status.. 7. Tremors = admit team wu / ? neuropathy  8.. WRU:[id number] 9. DM per admit  Ernest Haber, PA-C [place] [phone] 01/13/2019,10:54 AM  LOS: 2 days   Pt seen, examined and agree w A/P as above.  Kelly Splinter  MD 01/13/2019, 1:24 PM    Labs: Basic Metabolic Panel: Recent Labs  Lab 01/11/19 0552 01/12/19 0520 01/13/19 0612  NA 137 136 136  K 4.4 4.6 4.1  CL 95* 95* 93*  CO2 21* 23 24  GLUCOSE 95 256* 131*  BUN 105* 104* 101*  CREATININE 11.85* 10.58* 10.31*  CALCIUM 8.3* 8.3* 8.3*  PHOS  --  9.5* 7.8*   Liver Function Tests: Recent Labs  Lab 01/09/19 1922 01/12/19 0520 01/13/19 0612  AST 20  --   --   ALT 20  --   --   ALKPHOS 349*  --   --   BILITOT 0.5  --   --   PROT 6.7  --   --   ALBUMIN 1.7* 1.6* 1.8*   No results for input(s): LIPASE, AMYLASE in the last 168 hours. No results for input(s): AMMONIA in the last 168  hours. CBC: Recent Labs  Lab 01/09/19 1922 01/11/19 0552 01/12/19 0520 01/13/19 0612  WBC 29.6* 26.9* 16.0* 18.8*  NEUTROABS 26.2*  --  14.4* 14.3*  HGB 8.2* 8.3* 7.6* 8.3*  HCT 26.2* 26.1* 23.5* 26.8*  MCV 87.9 87.6 86.7 89.0  PLT 510* 461* 441* 455*   Cardiac Enzymes: No results for input(s): CKTOTAL, CKMB, CKMBINDEX, TROPONINI in the last 168 hours. CBG: Recent Labs  Lab 01/12/19 0718 01/12/19 1115 01/12/19 1613 01/12/19 2129 01/13/19 0659  GLUCAP 228* 175* 132* 197* 121*    Studies/Results: No results found. Medications: . sodium chloride 10 mL/hr at 01/11/19 0859  . dialysis solution 1.5% low-MG    . dialysis solution 1.5% low-MG/low-CA    . dialysis solution 2.5% low-MG/low-CA    . dialysis solution 4.25% low-MG/low-CA     . acetaminophen  650 mg Oral Q6H   Or  . acetaminophen  650 mg Rectal Q6H  . amitriptyline  10 mg Oral QHS  . aspirin EC  81 mg Oral Daily  . calcitRIOL  1.5  mcg Oral Daily  . calcium acetate  2,001 mg Oral With snacks  . calcium acetate  2,668 mg Oral TID WC  . clopidogrel  75 mg Oral Daily  . [START ON 01/19/2019] darbepoetin (ARANESP) injection - NON-DIALYSIS  60 mcg Subcutaneous Q Fri-1800  . ferrous sulfate  325 mg Oral Daily  . fluticasone furoate-vilanterol  1 puff Inhalation Daily  . gentamicin cream  1 application Topical QHS  . insulin aspart  0-9 Units Subcutaneous TID WC  . insulin glargine  10 Units Subcutaneous BID  . isosorbide mononitrate  15 mg Oral Daily  . lidocaine-prilocaine  1 application Topical Q M,W,F-HD  . metoCLOPramide  5 mg Oral TID AC & HS  . metoprolol succinate  50 mg Oral Daily  . montelukast  10 mg Oral QHS  . multivitamin with minerals  1 tablet Oral Daily  . pantoprazole  40 mg Oral Daily  . polyethylene glycol  17 g Oral BID  . pravastatin  40 mg Oral q1800  . pregabalin  75 mg Oral Daily  . senna-docusate  1 tablet Oral BID  . umeclidinium bromide  1 puff Inhalation Daily

## 2019-01-13 NOTE — Progress Notes (Deleted)
Pt refused to do PD tonight. Charge Nurse Otila Kluver notified.

## 2019-01-14 DIAGNOSIS — N186 End stage renal disease: Secondary | ICD-10-CM | POA: Diagnosis not present

## 2019-01-14 DIAGNOSIS — D509 Iron deficiency anemia, unspecified: Secondary | ICD-10-CM | POA: Diagnosis not present

## 2019-01-14 DIAGNOSIS — E440 Moderate protein-calorie malnutrition: Secondary | ICD-10-CM | POA: Diagnosis not present

## 2019-01-14 DIAGNOSIS — Z4932 Encounter for adequacy testing for peritoneal dialysis: Secondary | ICD-10-CM | POA: Diagnosis not present

## 2019-01-14 DIAGNOSIS — N2581 Secondary hyperparathyroidism of renal origin: Secondary | ICD-10-CM | POA: Diagnosis not present

## 2019-01-14 DIAGNOSIS — Z992 Dependence on renal dialysis: Secondary | ICD-10-CM | POA: Diagnosis not present

## 2019-01-14 DIAGNOSIS — I679 Cerebrovascular disease, unspecified: Secondary | ICD-10-CM

## 2019-01-14 LAB — CBC
HCT: 25 % — ABNORMAL LOW (ref 36.0–46.0)
Hemoglobin: 7.7 g/dL — ABNORMAL LOW (ref 12.0–15.0)
MCH: 27.7 pg (ref 26.0–34.0)
MCHC: 30.8 g/dL (ref 30.0–36.0)
MCV: 89.9 fL (ref 80.0–100.0)
Platelets: 454 10*3/uL — ABNORMAL HIGH (ref 150–400)
RBC: 2.78 MIL/uL — ABNORMAL LOW (ref 3.87–5.11)
RDW: 14.6 % (ref 11.5–15.5)
WBC: 18.5 10*3/uL — ABNORMAL HIGH (ref 4.0–10.5)
nRBC: 0 % (ref 0.0–0.2)

## 2019-01-14 LAB — RENAL FUNCTION PANEL
Albumin: 1.7 g/dL — ABNORMAL LOW (ref 3.5–5.0)
Anion gap: 12 (ref 5–15)
BUN: 109 mg/dL — ABNORMAL HIGH (ref 6–20)
CO2: 24 mmol/L (ref 22–32)
Calcium: 8.5 mg/dL — ABNORMAL LOW (ref 8.9–10.3)
Chloride: 98 mmol/L (ref 98–111)
Creatinine, Ser: 9.98 mg/dL — ABNORMAL HIGH (ref 0.44–1.00)
GFR calc Af Amer: 5 mL/min — ABNORMAL LOW (ref >60)
GFR calc non Af Amer: 4 mL/min — ABNORMAL LOW (ref >60)
Glucose, Bld: 103 mg/dL — ABNORMAL HIGH (ref 70–99)
Phosphorus: 6.3 mg/dL — ABNORMAL HIGH (ref 2.5–4.6)
Potassium: 4.5 mmol/L (ref 3.5–5.1)
Sodium: 134 mmol/L — ABNORMAL LOW (ref 135–145)

## 2019-01-14 LAB — GLUCOSE, CAPILLARY
Glucose-Capillary: 119 mg/dL — ABNORMAL HIGH (ref 70–99)
Glucose-Capillary: 150 mg/dL — ABNORMAL HIGH (ref 70–99)

## 2019-01-14 MED ORDER — FLUCONAZOLE 150 MG PO TABS: 150.0000 mg | ORAL_TABLET | Freq: Every day | ORAL | 0 refills | Status: DC

## 2019-01-14 MED ORDER — POLYETHYLENE GLYCOL 3350 17 G PO PACK: 17.0000 g | PACK | Freq: Two times a day (BID) | ORAL | 0 refills | Status: AC

## 2019-01-14 MED ORDER — ERYTHROMYCIN BASE 250 MG PO TABS: 250.0000 mg | ORAL_TABLET | Freq: Three times a day (TID) | ORAL | 0 refills | Status: AC

## 2019-01-14 MED ORDER — LINEZOLID 600 MG PO TABS: 600.0000 mg | ORAL_TABLET | Freq: Two times a day (BID) | ORAL | 0 refills | Status: AC

## 2019-01-14 NOTE — Progress Notes (Signed)
Spoke with Neuro on-call NP who discussed case with Neuro attending regarding new b/l hand tremor, LE weakness and findings on Brain MRI that suggest possible small vessel disease vs demyelination. Neuro attending will review MRI and recommends outpatient follow up for further workup. Discussed recommendations with patient who is amenable to discharge home with HHPT as recommended. She will continue with PD tonight as recommended by Nephrology. Will continue on 14 day course of zyvox as recommended by ID with ID f/u on 01/26/19, already has some pills of this at home and can pick up additional pills at [facility]. Will d/c to home with HHPT.  Rory Percy, DO PGY-3, [place] [facility] 01/14/2019 1:03 PM

## 2019-01-14 NOTE — Progress Notes (Signed)
Patient overheard talking on phone to mother about food that had been brought in from other family member earlier in the day and she had "eaten it all up, it was so good." No nausea or vomiting noted this past 12 hr shift. Refused Elavil, Singulair, Miralax, Senekot and Tylenol as well as PD.

## 2019-01-14 NOTE — Discharge Instructions (Signed)
You had surgery for a skin abscess and will continue on antibiotics at home. You will follow up with Infectious Disease on 1/15. You will also follow up with Neurology for your weakness and tremor. You should make an appointment soon to see your primary doctor.   Skin Abscess  A skin abscess is an infected area of your skin that contains pus and other material. An abscess can happen in any part of your body. Some abscesses break open (rupture) on their own. Most continue to get worse unless they are treated. The infection can spread deeper into the body and into your blood, which can make you feel sick. A skin abscess is caused by germs that enter the skin through a cut or scrape. It can also be caused by blocked oil and sweat glands or infected hair follicles. This condition is usually treated by:  Draining the pus.  Taking antibiotic medicines.  Placing a warm, wet washcloth over the abscess. Follow these instructions at home: Medicines   Take over-the-counter and prescription medicines only as told by your doctor.  If you were prescribed an antibiotic medicine, take it as told by your doctor. Do not stop taking the antibiotic even if you start to feel better. Abscess care   If you have an abscess that has not drained, place a warm, clean, wet washcloth over the abscess several times a day. Do this as told by your doctor.  Follow instructions from your doctor about how to take care of your abscess. Make sure you: ? Cover the abscess with a bandage (dressing). ? Change your bandage or gauze as told by your doctor. ? Wash your hands with soap and water before you change the bandage or gauze. If you cannot use soap and water, use hand sanitizer.  Check your abscess every day for signs that the infection is getting worse. Check for: ? More redness, swelling, or pain. ? More fluid or blood. ? Warmth. ? More pus or a bad smell. General instructions  To avoid spreading the  infection: ? Do not share personal care items, towels, or hot tubs with others. ? Avoid making skin-to-skin contact with other people.  Keep all follow-up visits as told by your doctor. This is important. Contact a doctor if:  You have more redness, swelling, or pain around your abscess.  You have more fluid or blood coming from your abscess.  Your abscess feels warm when you touch it.  You have more pus or a bad smell coming from your abscess.  You have a fever.  Your muscles ache.  You have chills.  You feel sick. Get help right away if:  You have very bad (severe) pain.  You see red streaks on your skin spreading away from the abscess. Summary  A skin abscess is an infected area of your skin that contains pus and other material.  The abscess is caused by germs that enter the skin through a cut or scrape. It can also be caused by blocked oil and sweat glands or infected hair follicles.  Follow your doctor's instructions on caring for your abscess, taking medicines, preventing infections, and keeping follow-up visits. This information is not intended to replace advice given to you by your health care provider. Make sure you discuss any questions you have with your health care provider. Document Revised: 08/03/2018 Document Reviewed: 02/10/2017 Elsevier Patient Education  [place].    Mechanical Wound Debridement You can shower and get soap and water into  the site, then repack./ Mechanical wound debridement is a treatment to remove dead tissue from a wound. This helps the wound heal. The treatment involves cleaning the wound by using mechanical force. This may include:  Wound irrigation. This method involves rinsing the wound out with fluid.  Wet-to-dry dressing. This method involves putting wet dressings into the wound, allowing them to dry, then removing the dry dressings to remove debris and tissue from the wound.  Using a pad or gauze to remove dead  tissue and debris from the wound.  Pulsatile lavage. This method involves cleaning the wound with a pressurized stream of fluid. Depending on the wound, you may need to repeat this procedure or change to another form of debridement as your wound starts to heal. Tell a health care provider about:  Any allergies you have.  All medicines you are taking, including vitamins, herbs, eye drops, creams, and over-the-counter medicines.  Any blood disorders you have.  Any medical conditions you have, or have had, especially conditions that cause a decrease in blood flow to the wound area, such as peripheral vascular disease, or conditions that affect your body's defense (immune) system or white blood count.  Any surgeries you have had.  Whether you are pregnant or may be pregnant. What are the risks? Generally, this is a safe procedure. However, problems may occur, including:  Infection.  Bleeding.  Damage to healthy tissue in and around your wound.  Soreness or pain.  Failure of the wound to heal.  Scarring. What happens during the procedure?   Your health care provider may apply a numbing medicine (topical anesthetic) to the wound.  Your health care provider may irrigate your wound with a germ-free salt solution (saline). This helps to loosen or remove debris, bacteria, and dead tissue.  Depending on the type of mechanical wound debridement you are having, your health care provider may do one of the following: ? Put a dressing on your wound. You may have a dry gauze pad placed into the wound. Your health care provider will remove the gauze after the wound is dry. Any dead tissue and debris that has dried into the gauze will be lifted out of the wound (wet-to-dry debridement). ? Use a type of pad (monofilament fiber debridement pad) that has a fluffy surface on one side that picks up dead tissue and debris from your wound. Your health care provider wets the pad and wipes it over your  wound for several minutes. ? Flush (irrigate) your wound with a pressurized stream of solution such as saline or water (pulsatile lavage).  Once your health care provider is finished, he or she may apply a light dressing to your wound. The procedure may vary among health care providers and hospitals. What happens after the procedure?  You may get medicine for pain. Summary  Mechanical wound debridement is a treatment to remove dead tissue from a wound. This helps the wound heal.  Depending on the wound, you may need to repeat this procedure or change to another form of debridement as your wound starts to heal.  This treatment may include: wound irrigation, wet-to-dry dressing, using a pad or gauze to remove dead tissue and debris from the wound, or pulsatile lavage. This information is not intended to replace advice given to you by your health care provider. Make sure you discuss any questions you have with your health care provider. Document Revised: 12/05/2017 Document Reviewed: 12/05/2017 Elsevier Patient Education  2020 [employer].

## 2019-01-14 NOTE — Progress Notes (Signed)
Cultures reviewed and remain no growth.  No changes from an ID standpoint and can proceed with plan as outlined by Dr. Tommy Medal on 1/1.  I will sign off Thayer Headings, MD

## 2019-01-14 NOTE — Progress Notes (Addendum)
[facility] Progress Note   Subjective: Refused HD yesterday D/T not being disconnected on time. No C/Os.   Objective Vitals:   01/13/19 1701 01/13/19 2035 01/14/19 0551 01/14/19 0931  BP: (!) 142/80 (!) 148/67 (!) 149/68 (!) 156/68  Pulse: 99 (!) 105 98 96  Resp: 20 18 12 14   Temp: 98.4 F (36.9 C) 98.3 F (36.8 C) 99 F (37.2 C) 98.8 F (37.1 C)  TempSrc: Oral Oral Oral Oral  SpO2: 98% 100% 100% 98%  Weight:  78.4 kg    Height:       Physical Exam General: WN, WD NAD Heart: S1,S2 RRR  Lungs: CTAB Abdomen: LUQ PD cath, drsg CDI, no tenderness or distention.  Extremities: 1+ RLE edema, trace LLE edema Dialysis Access: L AVF + bruit, PD cath as noted above    Additional Objective Labs: Basic Metabolic Panel: Recent Labs  Lab 01/11/19 0552 01/12/19 0520 01/13/19 0612  NA 137 136 136  K 4.4 4.6 4.1  CL 95* 95* 93*  CO2 21* 23 24  GLUCOSE 95 256* 131*  BUN 105* 104* 101*  CREATININE 11.85* 10.58* 10.31*  CALCIUM 8.3* 8.3* 8.3*  PHOS  --  9.5* 7.8*   Liver Function Tests: Recent Labs  Lab 01/09/19 1922 01/12/19 0520 01/13/19 0612  AST 20  --   --   ALT 20  --   --   ALKPHOS 349*  --   --   BILITOT 0.5  --   --   PROT 6.7  --   --   ALBUMIN 1.7* 1.6* 1.8*   No results for input(s): LIPASE, AMYLASE in the last 168 hours. CBC: Recent Labs  Lab 01/09/19 1922 01/11/19 0552 01/12/19 0520 01/13/19 0612  WBC 29.6* 26.9* 16.0* 18.8*  NEUTROABS 26.2*  --  14.4* 14.3*  HGB 8.2* 8.3* 7.6* 8.3*  HCT 26.2* 26.1* 23.5* 26.8*  MCV 87.9 87.6 86.7 89.0  PLT 510* 461* 441* 455*   Blood Culture    Component Value Date/Time   SDES ABSCESS SACRAL 01/11/2019 0935   SPECREQUEST PATIENT ON FOLLOWING LINZOLID 01/11/2019 0935   CULT  01/11/2019 0935    NO GROWTH 2 DAYS NO ANAEROBES ISOLATED; CULTURE IN PROGRESS FOR 5 DAYS Performed at [facility], [place] [address], [place], [place] [address]    REPTSTATUS PENDING 01/11/2019 0935     Cardiac Enzymes: No results for input(s): CKTOTAL, CKMB, CKMBINDEX, TROPONINI in the last 168 hours. CBG: Recent Labs  Lab 01/13/19 0659 01/13/19 1131 01/13/19 1658 01/13/19 2031 01/14/19 0650  GLUCAP 121* 122* 74 122* 119*   Iron Studies: No results for input(s): IRON, TIBC, TRANSFERRIN, FERRITIN in the last 72 hours. @lablastinr3 @ Studies/Results: MR BRAIN WO CONTRAST  Result Date: 01/13/2019 CLINICAL DATA:  Generalize weakness, left worse than right, affecting the arms and legs. Symptoms began about 2 weeks ago. EXAM: MRI HEAD WITHOUT CONTRAST TECHNIQUE: Multiplanar, multiecho pulse sequences of the brain and surrounding structures were obtained without intravenous contrast. COMPARISON:  05/09/2012 FINDINGS: Brain: Abnormal T2 and FLAIR signal in the pons most consistent with chronic small vessel ischemic change. No focal cerebellar finding. Cerebral hemispheres show a few foci of T2 and FLAIR signal within the white matter including a periventricular focus on the left adjacent to the posterior body of the lateral ventricle. These findings were not present in 2014. The differential diagnosis is that of an early manifestation of small vessel change versus demyelinating disease/multiple sclerosis. No cortical or large vessel territory  abnormality. No mass lesion, hemorrhage, hydrocephalus or extra-axial collection. Vascular: Major vessels at the base of the brain show flow. Skull and upper cervical spine: Negative Sinuses/Orbits: Mucosal thickening of the right maxillary sinus. Orbits negative. Other: None IMPRESSION: No acute or subacute finding. Compared to the study of 2014, there are now areas of T2 and FLAIR signal within the pons and within the cerebral hemispheric white matter, including a periventricular white matter focus adjacent to the posterior body of the left lateral ventricle. Differential diagnosis for these findings is that of an early manifestation of small vessel disease  versus demyelinating disease/multiple sclerosis. As the patient has a history of diabetes, hypertension and vascular disease, small-vessel disease is favored. Electronically Signed   By: Nelson Chimes M.D.   On: 01/13/2019 17:54   Medications: . sodium chloride 10 mL/hr at 01/11/19 0859  . dialysis solution 1.5% low-MG    . dialysis solution 1.5% low-MG/low-CA    . dialysis solution 2.5% low-MG/low-CA    . dialysis solution 4.25% low-MG/low-CA     . acetaminophen  650 mg Oral Q6H   Or  . acetaminophen  650 mg Rectal Q6H  . amitriptyline  10 mg Oral QHS  . aspirin EC  81 mg Oral Daily  . calcitRIOL  1.5 mcg Oral Daily  . calcium acetate  2,001 mg Oral With snacks  . calcium acetate  2,668 mg Oral TID WC  . clopidogrel  75 mg Oral Daily  . [START ON 01/19/2019] darbepoetin (ARANESP) injection - NON-DIALYSIS  60 mcg Subcutaneous Q Fri-1800  . erythromycin  250 mg Oral TID  . ferrous sulfate  325 mg Oral Daily  . fluticasone furoate-vilanterol  1 puff Inhalation Daily  . gentamicin cream  1 application Topical QHS  . insulin aspart  0-9 Units Subcutaneous TID WC  . insulin glargine  10 Units Subcutaneous BID  . isosorbide mononitrate  15 mg Oral Daily  . lidocaine-prilocaine  1 application Topical Q M,W,F-HD  . metoCLOPramide  5 mg Oral TID AC & HS  . metoprolol succinate  50 mg Oral Daily  . montelukast  10 mg Oral QHS  . multivitamin with minerals  1 tablet Oral Daily  . pantoprazole  40 mg Oral Daily  . polyethylene glycol  17 g Oral BID  . pravastatin  40 mg Oral q1800  . pregabalin  75 mg Oral Daily  . senna-docusate  1 tablet Oral BID  . umeclidinium bromide  1 puff Inhalation Daily  . vancomycin variable dose per unstable renal function (pharmacist dosing)   Does not apply See admin instructions     OP  CCPD  Dr Deterding = CCPD 4 exchanges with 2L at night 2 1.5% and 1 2.5% with 5th fill of 1L which she drains at midday.  Problem/Plan: 1.Sacral, coccygeal and gluteal  abscesses:Status post aspiration of perisacral fluid collection on 12/21 with negative Gram stain and cultures. Then need for  01/11/19 CCS  ID SACRAL ABSCESS/on antibiotic therapy with ID Following   2. ESRD:on  OP CCPD  Continue peritoneal dialysisnow had ccpd machines available and now agrees to have PD tonight. no evidence of PD peritonitis based on exam. Previous imaging does not show communication of abscess with peritoneum.Resume CCPD tonight 3.Hypertension/ Volume = some excess vol on exam / bp mildly up Use 2.5 %/ 4.25%  pd bags  4. Anemia:hgb 8.3 Started Aranesp 60 mcg  01/01 /21 SQ. Follow trend.  5. CKD-MBD:Calcium level currently at goal with rising  phosphorus level.6.8 continue phos binders and incr 1 to 2 ac  6. Nutrition:alb 1.7Continue current diet with ONS/protein supplementation to optimize wound healing/PD losses. She has chronic gastroparesis on metoclopramide that further compromise hernutritional status.. 7. Tremors = admit team wu / ? neuropathy  8.. SOX:[id number] 9. DMper admit  [employer] Brown NP-C 01/14/2019, 10:27 AM  [facility] [phone]  Pt seen, examined and agree w A/P as above. Looks like plans are for [employer] home today, will check w/ primary team.  Kelly Splinter  MD 01/14/2019, 1:24 PM

## 2019-01-14 NOTE — Progress Notes (Signed)
Family Medicine Teaching Service Daily Progress Note Intern Pager: [phone]  Patient name: Susan Horton Medical record number: [id number] Date of birth: [birth year] Age: 49 y.o. Gender: female  Primary Care Provider: Charlott Rakes, MD Consultants: Gen Surgery, nephrology, ID Code Status: Full   Pt Overview and Major Events to Date:  12/30 - Admitted to FPTS for sacral abscess 12/31 - I&D w/ gen surg 1/2 - new b/l hand tremor  Assessment and Plan: Susan L Jonesis a 49 y.o.femalepresenting with pain in her buttock found to have sacral abscess, now status post incision and drainage.  PMH is significant for ESRD on home PD, T2DM, HTN, CHF, PVD  Buttock pain2/2 sacral abscesswith h/o recent sacralphlegmon POD#3 from I&D on 12/31. Remains afebrile with stable VS. Pt receiving oxycodone and Tylenol for pain, controlled per patient.  Surgical cultures with no growth to date. Surgery following, recommended dressing changes and dc from surgical standpoint w/ outpt f/u in 2 weeks.  ID planning for discharge with zyvox 600mg  q12hrs given negative cultures as she likely does not have osteomyelitis. F/u on 1/15 at [facility].   -continue vancomycin with transition to PO zyvox at d/c -ID and surgery now signed off.  -scheduled tylenol q6h -pain control per surgery: oxycodone 5mg  q6h PRN -vitals per routine  Tremor and weakness Patient reports continued new bilateral hand tremor as well as chronic LE weakness. Hand tremor resolves with distraction. Question effort with LE strength testing, otherwise no focal findings. Brain MRI obtained yesterday given patient concern about stroke vs MS, has FH of MS. Brain MRI showed no acute abnormality but was significant for T2 and flair signals which could be consistent with small vessel disease vs demyelination. Likely most consistent for small vessel disease given chronic comorbidities. -will consult neuro given MRI findings for recs. -Continue to  monitor  Persistent Leukocytosis CBC pending this am. Patient to f/u with hematology as outpt for bone marrow bx.   -outpatient f/u with hematology   Anemia Improved 7.6>8.3 1/2. CBC pending this am -continue to monitor -holding VTE prophylaxis given her decreasing hgb, patient on DAPT currently.   ESRD on home PD Per chart review, refused PD last night, patient states came too late. Planning on tonight as usual. 1+ pitting edema on exam today. BMP not yet drawn this am. Home medications include PhosLo, calcitriol.   -Nephrology consulted, appreciate recommendations -Continue home medications  CAD/PAD Follows with [place] [facility]. Patient also with history of drug-eluting stent placed in 2018. Home meds include aspirin, Plavix, metoprolol.  -continue aspirin and Plavix  -Monitor for bleeding  HFpEF Recent echo on 12/11 showing EF 60 to 65% with normal left ventricularfunction. Echo from 2019 showing EF 55 to 60% with G2DD. Her medications include metoprolol & IMDUR -continue home meds  T2DM with peripheral neuropathy AM CBG 119. Home meds include Toujeo 10U bidunits as well as Humalog sliding scale insulin. -sSSI -lantus 10U bid -monitor CBGs  -continue amitriptyline   Gastroparesis  H/o gastroparesis with frequent vomiting. Refractory to home reglan. Started on erythromycin yesterday with improvement. -continue erythromycin (1/2- )  HTN Currently normotensive at 139/79. Home meds: toprol 50mg  daily -continue home meds  Asthma Meds: Singulair as needed, Advair twice daily and Ventolin as needed. -continue home meds  FEN/GI:renal/carb modified  Prophylaxis:plavix & ASA   Disposition: awaiting culture results   Subjective:  Some sacral pain today. Still endorsing bilateral hand tremor and LE weakness L>R. Wants to work up tremor and weakness, feels she  went home too early last admission and wants to get everything done here.    Objective: Temp:  [98.2 F (36.8 C)-99 F (37.2 C)] 99 F (37.2 C) (01/03 0551) Pulse Rate:  [98-105] 98 (01/03 0551) Resp:  [12-20] 12 (01/03 0551) BP: (134-149)/(67-80) 149/68 (01/03 0551) SpO2:  [98 %-100 %] 100 % (01/03 0551) Weight:  [78.4 kg] 78.4 kg (01/02 2035) Physical Exam: General: lying in bed on R side, in NAD Cardiovascular: RRR, no murmur  Respiratory: CTAB Abdomen: soft, rotund abdomen, +BS, nonTTP. PD site CDI Extremities: warm and well perfused. 1+ pitting edema. Back: surgical dressing in place, no appreciable surrounding erythema or swelling. MSK: Full ROM, strength 5/5 to UE bilaterally, 3/5 LLE and 4/5 RLE strength (question effort), grip strength intact. Neuro: Alert and oriented, speech normal.  Romberg negative.  Extraocular movements intact.  Intact symmetric sensation to light touch of face bilaterally. Some decrease in sensation to LLE (states feels water is running down leg when touched).  Hearing grossly intact bilaterally.  Tongue protrudes normally with no deviation.  Shoulder shrug, smile symmetric.   Laboratory: Recent Labs  Lab 01/11/19 0552 01/12/19 0520 01/13/19 0612  WBC 26.9* 16.0* 18.8*  HGB 8.3* 7.6* 8.3*  HCT 26.1* 23.5* 26.8*  PLT 461* 441* 455*   Recent Labs  Lab 01/09/19 1922 01/11/19 0552 01/12/19 0520 01/13/19 0612  NA 135 137 136 136  K 4.2 4.4 4.6 4.1  CL 92* 95* 95* 93*  CO2 24 21* 23 24  BUN 93* 105* 104* 101*  CREATININE 11.48* 11.85* 10.58* 10.31*  CALCIUM 9.1 8.3* 8.3* 8.3*  PROT 6.7  --   --   --   BILITOT 0.5  --   --   --   ALKPHOS 349*  --   --   --   ALT 20  --   --   --   AST 20  --   --   --   GLUCOSE 93 95 256* 131*    Imaging/Diagnostic Tests: MR BRAIN WO CONTRAST  Result Date: 01/13/2019 CLINICAL DATA:  Generalize weakness, left worse than right, affecting the arms and legs. Symptoms began about 2 weeks ago. EXAM: MRI HEAD WITHOUT CONTRAST TECHNIQUE: Multiplanar, multiecho pulse sequences of the  brain and surrounding structures were obtained without intravenous contrast. COMPARISON:  05/09/2012 FINDINGS: Brain: Abnormal T2 and FLAIR signal in the pons most consistent with chronic small vessel ischemic change. No focal cerebellar finding. Cerebral hemispheres show a few foci of T2 and FLAIR signal within the white matter including a periventricular focus on the left adjacent to the posterior body of the lateral ventricle. These findings were not present in 2014. The differential diagnosis is that of an early manifestation of small vessel change versus demyelinating disease/multiple sclerosis. No cortical or large vessel territory abnormality. No mass lesion, hemorrhage, hydrocephalus or extra-axial collection. Vascular: Major vessels at the base of the brain show flow. Skull and upper cervical spine: Negative Sinuses/Orbits: Mucosal thickening of the right maxillary sinus. Orbits negative. Other: None IMPRESSION: No acute or subacute finding. Compared to the study of 2014, there are now areas of T2 and FLAIR signal within the pons and within the cerebral hemispheric white matter, including a periventricular white matter focus adjacent to the posterior body of the left lateral ventricle. Differential diagnosis for these findings is that of an early manifestation of small vessel disease versus demyelinating disease/multiple sclerosis. As the patient has a history of diabetes, hypertension and vascular disease, small-vessel  disease is favored. Electronically Signed   By: Nelson Chimes M.D.   On: 01/13/2019 17:54    Rory Percy, DO 01/14/2019, 8:35 AM PGY-3, [place] Intern pager: [phone], text pages welcome

## 2019-01-14 NOTE — TOC Transition Note (Signed)
Transition of Care [facility]) - CM/SW Discharge Note   Patient Details  Name: Susan Horton MRN: [id number] Date of Birth: [birth year]  Transition of Care [facility]) CM/SW Contact:  Bartholomew Crews, RN Phone Number: [phone] 01/14/2019, 3:07 PM   Clinical Narrative:    Spoke with patient at bedside. States that her sister helps her at home as needed. Discussed HH recommendations - patient agreeable. Referral accepted by [facility]. Patient states that her sister will be providing transportation home. No other transition needs identified at this time.    Final next level of care: Home w Home Health Services Barriers to Discharge: No Barriers Identified   Patient Goals and CMS Choice   CMS Medicare.gov Compare Post Acute Care list provided to:: Patient Choice offered to / list presented to : Patient  Discharge Placement                       Discharge Plan and Services                DME Arranged: N/A DME Agency: NA       HH Arranged: RN, PT, OT [place] Agency: [place] ([place]) Date [place]: 01/14/19 Time [place]: 1507 Representative spoke with at [place]: [place] ([place]) Interventions     Readmission Risk Interventions No flowsheet data found.

## 2019-01-14 NOTE — Progress Notes (Signed)
Susan Horton to be discharged Home per MD order. Discussed prescriptions and follow up appointments with the patient. Prescriptions explained to patient; medication list explained in detail. Patient verbalized understanding.  Skin clean, dry and intact without evidence of skin break down, no evidence of skin tears noted. IV catheter discontinued intact. Site without signs and symptoms of complications. Dressing and pressure applied. Pt denies pain at the site currently. No complaints noted.  Patient free of lines, drains, and wounds.   An After Visit Summary (AVS) was printed and given to the patient. Patient escorted via wheelchair, and discharged home via private auto.  Patient and sister instructed regarding dressing change, they both verbalized understanding by teach back method.  [name], RN

## 2019-01-15 ENCOUNTER — Other Ambulatory Visit: Payer: Self-pay

## 2019-01-15 DIAGNOSIS — E7849 Other hyperlipidemia: Secondary | ICD-10-CM | POA: Diagnosis not present

## 2019-01-15 DIAGNOSIS — E440 Moderate protein-calorie malnutrition: Secondary | ICD-10-CM | POA: Diagnosis not present

## 2019-01-15 DIAGNOSIS — D509 Iron deficiency anemia, unspecified: Secondary | ICD-10-CM | POA: Diagnosis not present

## 2019-01-15 DIAGNOSIS — N2581 Secondary hyperparathyroidism of renal origin: Secondary | ICD-10-CM | POA: Diagnosis not present

## 2019-01-15 DIAGNOSIS — Z4932 Encounter for adequacy testing for peritoneal dialysis: Secondary | ICD-10-CM | POA: Diagnosis not present

## 2019-01-15 DIAGNOSIS — I739 Peripheral vascular disease, unspecified: Secondary | ICD-10-CM

## 2019-01-15 DIAGNOSIS — N186 End stage renal disease: Secondary | ICD-10-CM | POA: Diagnosis not present

## 2019-01-15 DIAGNOSIS — Z992 Dependence on renal dialysis: Secondary | ICD-10-CM | POA: Diagnosis not present

## 2019-01-15 LAB — CULTURE, BLOOD (ROUTINE X 2)
Culture: NO GROWTH
Special Requests: ADEQUATE

## 2019-01-16 ENCOUNTER — Ambulatory Visit (INDEPENDENT_AMBULATORY_CARE_PROVIDER_SITE_OTHER)
Admission: RE | Admit: 2019-01-16 | Discharge: 2019-01-16 | Disposition: A | Discharge status: Home / Self Care | Payer: Medicare Other | Source: Ambulatory Visit | Attending: Vascular Surgery | Admitting: Vascular Surgery

## 2019-01-16 ENCOUNTER — Ambulatory Visit (INDEPENDENT_AMBULATORY_CARE_PROVIDER_SITE_OTHER): Payer: Medicare Other | Admitting: Vascular Surgery

## 2019-01-16 ENCOUNTER — Encounter: Payer: Self-pay | Admitting: Vascular Surgery

## 2019-01-16 ENCOUNTER — Ambulatory Visit (HOSPITAL_COMMUNITY)
Admission: RE | Admit: 2019-01-16 | Discharge: 2019-01-16 | Disposition: A | Discharge status: Home / Self Care | Payer: Medicare Other | Source: Ambulatory Visit | Attending: Vascular Surgery | Admitting: Vascular Surgery

## 2019-01-16 ENCOUNTER — Other Ambulatory Visit: Payer: Self-pay

## 2019-01-16 VITALS — BP 176/89 | HR 112 | Temp 98.0°F | Resp 20 | Ht 65.0 in | Wt 172.0 lb

## 2019-01-16 DIAGNOSIS — Z992 Dependence on renal dialysis: Secondary | ICD-10-CM | POA: Diagnosis not present

## 2019-01-16 DIAGNOSIS — N186 End stage renal disease: Secondary | ICD-10-CM | POA: Diagnosis not present

## 2019-01-16 DIAGNOSIS — Z4932 Encounter for adequacy testing for peritoneal dialysis: Secondary | ICD-10-CM | POA: Diagnosis not present

## 2019-01-16 DIAGNOSIS — E440 Moderate protein-calorie malnutrition: Secondary | ICD-10-CM | POA: Diagnosis not present

## 2019-01-16 DIAGNOSIS — I739 Peripheral vascular disease, unspecified: Secondary | ICD-10-CM | POA: Insufficient documentation

## 2019-01-16 DIAGNOSIS — D509 Iron deficiency anemia, unspecified: Secondary | ICD-10-CM | POA: Diagnosis not present

## 2019-01-16 DIAGNOSIS — N2581 Secondary hyperparathyroidism of renal origin: Secondary | ICD-10-CM | POA: Diagnosis not present

## 2019-01-16 LAB — AEROBIC/ANAEROBIC CULTURE W GRAM STAIN (SURGICAL/DEEP WOUND): Culture: NO GROWTH

## 2019-01-16 LAB — CULTURE, BLOOD (ROUTINE X 2): Culture: NO GROWTH

## 2019-01-16 NOTE — Progress Notes (Signed)
Patient name: Susan Horton MRN: [id number] DOB: March 27, 1970 Sex: female  REASON FOR VISIT: 1 month follow-up status post left SFA stent for bilateral lower extremity short distance lifestyle limiting claudication  HPI: Susan Horton is a 49 y.o. female with multiple medical problems as listed below including DM and ESRD that presents for follow-up status post left SFA angioplasty with stent placement on 01/10/2019 for short distance lifestyle limiting claudication.  Patient states she is really unsure how her left leg has done since the stent was placed since she has been in the hospital for other issues.  She recently underwent I&D of a sacral abscess on 01/11/2019 with general surgery.  States she is now using a walker and has had really limited ambulation since surgery.  Past Medical History:  Diagnosis Date  . Abnormal stress test   . Anemia   . Angio-edema   . Asthma   . CAD (coronary artery disease)    DES to mid LAD July 2018, residual moderate RCA disease  . Cataract   . CKD (chronic kidney disease) stage 4, GFR 15-29 ml/min (HCC)    Dialysis T/Th/Sa  . DVT (deep venous thrombosis) ([place])    1996 during pregnancy, 2015 left leg  . Eczema   . Essential hypertension 12/11/2015  . Gastroparesis   . GERD (gastroesophageal reflux disease)   . Gluteal abscess 12/27/2018  . Hidradenitis   . Migraine   . Neuropathy   . Peripheral vascular disease ([place])    blood clot in leg  . Progressive angina ([place]) 06/05/2014   Chest pain  . Type 2 diabetes mellitus ([place])   . Type II diabetes mellitus ([place])   . Urticaria     Past Surgical History:  Procedure Laterality Date  . A/V FISTULAGRAM N/A 06/22/2017   Procedure: A/V FISTULAGRAM - left arm;  Surgeon: [name], MD;  Location: [facility];  Service: Cardiovascular;  Laterality: N/A;  . ABDOMINAL AORTOGRAM W/LOWER EXTREMITY Bilateral 12/14/2018   Procedure: ABDOMINAL AORTOGRAM W/LOWER EXTREMITY;  Surgeon: [name], MD;  Location: [facility];  Service: Cardiovascular;  Laterality: Bilateral;  . ABDOMINOPLASTY    . ADENOIDECTOMY    . APPENDECTOMY  1995  . AV FISTULA PLACEMENT Left 02/01/2017   Procedure: ARTERIOVENOUS BRACHIOCEPHALIC (AV) FISTULA CREATION;  Surgeon: [name], MD;  Location: [facility];  Service: Vascular;  Laterality: Left;  . CHOLECYSTECTOMY  1993  . CORONARY ANGIOPLASTY WITH STENT PLACEMENT    . CORONARY STENT INTERVENTION N/A 08/05/2016   Procedure: Coronary Stent Intervention;  Surgeon: [name], MD;  Location: [facility];  Service: Cardiovascular;  Laterality: N/A;  . EXPLORATORY LAPAROTOMY  08/14/2005   lysis of adhesions, drainage of tubo-ovarian abscess  . FISTULA SUPERFICIALIZATION Left 09/14/2017   Procedure: FISTULA SUPERFICIALIZATION ARTERIOVENOUS FISTULA LEFT ARM;  Surgeon: [name], MD;  Location: [facility];  Service: Vascular;  Laterality: Left;  . FLEXIBLE SIGMOIDOSCOPY N/A 01/04/2019   Procedure: FLEXIBLE SIGMOIDOSCOPY;  Surgeon: Clarene Essex, MD;  Location: [facility];  Service: Endoscopy;  Laterality: N/A;  . HYDRADENITIS EXCISION  02/08/2011   Procedure: EXCISION HYDRADENITIS GROIN;  Surgeon: [name], MD;  Location: [facility];  Service: General;  Laterality: N/A;  Excisioin of Hidradenitis Left groin  . INCISION AND DRAINAGE ABSCESS N/A 01/11/2019   Procedure: INCISION AND DRAINAGE SACRAL ABSCESS;  Surgeon: Georganna Skeans, MD;  Location: [facility];  Service: General;  Laterality: N/A;  .  INGUINAL HIDRADENITIS EXCISION  07/06/2010   bilateral  . INSERTION OF DIALYSIS CATHETER N/A 09/14/2017   Procedure: INSERTION OF TUNNELED DIALYSIS CATHETER Right Internal Jugular;  Surgeon: [name], MD;  Location: [facility];  Service: Vascular;  Laterality: N/A;  . IR US GUIDE BX ASP/DRAIN  01/01/2019  . LEFT HEART CATH AND CORONARY ANGIOGRAPHY N/A 08/05/2016   Procedure: Left Heart Cath and Coronary  Angiography;  Surgeon: [name], MD;  Location: [facility];  Service: Cardiovascular;  Laterality: N/A;  . PERIPHERAL VASCULAR INTERVENTION Left 12/14/2018   Procedure: PERIPHERAL VASCULAR INTERVENTION;  Surgeon: [name], MD;  Location: [facility];  Service: Cardiovascular;  Laterality: Left;  SFA  . REDUCTION MAMMAPLASTY  2002  . TONSILLECTOMY    . TUBAL LIGATION  1996  . VAGINAL HYSTERECTOMY  08/04/2005   and cysto    Family History  Problem Relation Age of Onset  . Allergic rhinitis Mother   . Hypertension Father   . Allergic rhinitis Father   . Hypertension Sister   . Allergic rhinitis Sister   . Hypertension Brother   . Allergic rhinitis Brother   . Breast cancer Cousin        x2    SOCIAL HISTORY: Social History   Tobacco Use  . Smoking status: Former Smoker    Years: 20.00    Types: Cigarettes    Quit date: 11/2016    Years since quitting: 2.1  . Smokeless tobacco: Never Used  Substance Use Topics  . Alcohol use: No    Alcohol/week: 0.0 standard drinks    Allergies  Allergen Reactions  . Repatha [Evolocumab] Itching  . Lisinopril Cough    Current Outpatient Medications  Medication Sig Dispense Refill  . albuterol (PROVENTIL HFA;VENTOLIN HFA) 108 (90 BASE) MCG/ACT inhaler Inhale 1-2 puffs into the lungs every 6 (six) hours as needed for wheezing or shortness of breath.     [name] amitriptyline (ELAVIL) 10 MG tablet Take 1 tablet (10 mg total) by mouth at bedtime. 30 tablet 1  . aspirin 81 MG chewable tablet Chew 1 tablet (81 mg total) by mouth daily. 30 tablet 0  . B Complex-C-Zn-Folic Acid (DIALYVITE 026 WITH ZINC) 0.8 MG TABS Take 1 tablet by mouth daily.    . calcitRIOL (ROCALTROL) 0.5 MCG capsule Take 1.5 mcg by mouth daily.    . calcium acetate (PHOSLO) 667 MG capsule Take 1,334-2,001 mg by mouth See admin instructions. Take three capsules (2001 mg) by mouth up to three times daily with meals and two capsules (1334 mg) with  snacks  10  . clopidogrel (PLAVIX) 75 MG tablet Take 1 tablet (75 mg total) by mouth daily with breakfast. 90 tablet 3  . erythromycin (E-MYCIN) 250 MG tablet Take 1 tablet (250 mg total) by mouth 3 (three) times daily before meals for 14 days. 42 tablet 0  . Ferrous Sulfate (IRON) 325 (65 Fe) MG TABS Take 325 mg by mouth daily. 30 each 3  . fluconazole (DIFLUCAN) 150 MG tablet Take 1 tablet (150 mg total) by mouth daily. 1 tablet 0  . Fluticasone-Salmeterol (ADVAIR) 250-50 MCG/DOSE AEPB Inhale 1 puff into the lungs 2 (two) times daily. (Patient taking differently: Inhale 1 puff into the lungs daily as needed (shortness of breath). ) 60 each 3  . gentamicin cream (GARAMYCIN) 0.1 % Apply 1 application topically See admin instructions. Apply to access site nightly    . Insulin Glargine, 1 Unit Dial, (TOUJEO SOLOSTAR)  300 UNIT/ML SOPN Inject 10 Units into the skin 2 (two) times daily. 12 pen 3  . insulin regular (HUMULIN R) 100 units/mL injection Inject 0.2-0.25 mLs (20-25 Units total) into the skin See admin instructions. (Patient taking differently: Inject 7-15 Units into the skin 3 (three) times daily before meals. Per CBG) 45 mL 3  . lidocaine-prilocaine (EMLA) cream Apply 1 application topically See admin instructions. Apply small amount to access site (AVF) 30 minutes before hemodialysis, cover with occlusive dressing (Saran Wrap) if going out to dialysis center  5  . linezolid (ZYVOX) 600 MG tablet Take 1 tablet (600 mg total) by mouth 2 (two) times daily for 14 days. 4 tablet 0  . lovastatin (MEVACOR) 20 MG tablet Take 20 mg by mouth at bedtime.     . methocarbamol (ROBAXIN) 500 MG tablet Take 1 tablet (500 mg total) by mouth 2 (two) times daily as needed for muscle spasms. 60 tablet 1  . metoCLOPramide (REGLAN) 5 MG tablet TAKE 1 TABLET BY MOUTH 4 TIMES DAILY BEFORE MEALS AND AT BEDTIME. (Patient taking differently: Take 5 mg by mouth 4 (four) times daily -  before meals and at bedtime. ) 90  tablet 0  . metoprolol succinate (TOPROL-XL) 50 MG 24 hr tablet Take 1 tablet (50 mg total) by mouth daily. 90 tablet 3  . montelukast (SINGULAIR) 10 MG tablet Take 1 tablet (10 mg total) by mouth at bedtime. (Patient taking differently: Take 10 mg by mouth at bedtime as needed (allergies). ) 30 tablet 5  . mupirocin cream (BACTROBAN) 2 % Apply 1 application topically 2 (two) times daily as needed. To affected areas and cover with non-stick gauze. (Patient taking differently: Apply 1 application topically 2 (two) times daily as needed (for infection. Cover with non-stick gauze.). ) 15 g 0  . nitroGLYCERIN (NITROSTAT) 0.4 MG SL tablet Place 1 tablet (0.4 mg total) under the tongue every 5 (five) minutes as needed for chest pain. 25 tablet 2  . pantoprazole (PROTONIX) 40 MG tablet TAKE 1 TABLET BY MOUTH DAILY. (Patient taking differently: Take 40 mg by mouth daily. ) 30 tablet 7  . polyethylene glycol (MIRALAX / GLYCOLAX) 17 g packet Take 17 g by mouth 2 (two) times daily for 7 days. 14 each 0  . pregabalin (LYRICA) 75 MG capsule Take 1 capsule (75 mg total) by mouth daily. 30 capsule 1  . trimethoprim-polymyxin b (POLYTRIM) ophthalmic solution Place 1 drop into both eyes See admin instructions. Instill one drop into both eyes four times daily for 2 days after each monthly eye injection  12  . isosorbide mononitrate (IMDUR) 30 MG 24 hr tablet Take 0.5 tablets (15 mg total) by mouth daily. 45 tablet 3   No current facility-administered medications for this visit.    REVIEW OF SYSTEMS:  [X]  denotes positive finding, [ ]  denotes negative finding Cardiac  Comments:  Chest pain or chest pressure:    Shortness of breath upon exertion:    Short of breath when lying flat:    Irregular heart rhythm:        Vascular    Pain in calf, thigh, or hip brought on by ambulation:    Pain in feet at night that wakes you up from your sleep:     Blood clot in your veins:    Leg swelling:         Pulmonary      Oxygen at home:    Productive cough:  Wheezing:         Neurologic    Sudden weakness in arms or legs:     Sudden numbness in arms or legs:     Sudden onset of difficulty speaking or slurred speech:    Temporary loss of vision in one eye:     Problems with dizziness:         Gastrointestinal    Blood in stool:     Vomited blood:         Genitourinary    Burning when urinating:     Blood in urine:        Psychiatric    Major depression:         Hematologic    Bleeding problems:    Problems with blood clotting too easily:        Skin    Rashes or ulcers:        Constitutional    Fever or chills:      PHYSICAL EXAM: Vitals:   01/16/19 1127  BP: (!) 176/89  Pulse: (!) 112  Resp: 20  Temp: 98 F (36.7 C)  TempSrc: Temporal  SpO2: 96%  Weight: 172 lb (78 kg)  Height: 5\' 5"  (1.651 m)    GENERAL: The patient is a well-nourished female, in no acute distress. The vital signs are documented above. CARDIAC: There is a regular rate and rhythm.  VASCULAR:  Palpable femoral pulses bilaterally No right groin hematoma or other access issue Left posterior tibial brisk by Doppler and near triphasic PULMONARY: There is good air exchange bilaterally without wheezing or rales. ABDOMEN: Soft and non-tender with normal pitched bowel sounds.  MUSCULOSKELETAL: There are no major deformities or cyanosis. NEUROLOGIC: No focal weakness or paresthesias are detected.   DATA:   The left SFA stents widely patent on duplex today  ABIs are now 1.2 on the left and 0.6 on the right  Assessment/Plan:  49 year old female with multiple medical issues as noted above that presents for follow-up after left SFA intervention with stent placement for short distance lifestyle limiting claudication.  The stent looks widely patent on duplex today and her ABIs are much better.  She has very brisk Doppler signals at the left ankle.  That being said she has had multiple hospitalizations since  intervention including for recent sacral abscess requiring I&D.  She is really unsure about how much improvement she has seen since the stent was placed given she has not really walked.  I discussed that I would not plan to do anything with her right leg at this time until she has had time to test her left leg to see if there has been any significant improvement.  I will see her back in 3 months with repeat ABIs.  She will continue aspirin and plavix.  She does have a diseased right SFA as well.   [name], MD [facility] Office: [phone]

## 2019-01-17 ENCOUNTER — Ambulatory Visit: Payer: Medicare Other | Admitting: Internal Medicine

## 2019-01-17 DIAGNOSIS — N186 End stage renal disease: Secondary | ICD-10-CM | POA: Diagnosis not present

## 2019-01-17 DIAGNOSIS — Z992 Dependence on renal dialysis: Secondary | ICD-10-CM | POA: Diagnosis not present

## 2019-01-17 DIAGNOSIS — D509 Iron deficiency anemia, unspecified: Secondary | ICD-10-CM | POA: Diagnosis not present

## 2019-01-17 DIAGNOSIS — N2581 Secondary hyperparathyroidism of renal origin: Secondary | ICD-10-CM | POA: Diagnosis not present

## 2019-01-17 DIAGNOSIS — Z4932 Encounter for adequacy testing for peritoneal dialysis: Secondary | ICD-10-CM | POA: Diagnosis not present

## 2019-01-17 DIAGNOSIS — E440 Moderate protein-calorie malnutrition: Secondary | ICD-10-CM | POA: Diagnosis not present

## 2019-01-18 ENCOUNTER — Other Ambulatory Visit: Payer: Self-pay

## 2019-01-18 ENCOUNTER — Telehealth: Payer: Self-pay

## 2019-01-18 DIAGNOSIS — Z7982 Long term (current) use of aspirin: Secondary | ICD-10-CM | POA: Diagnosis not present

## 2019-01-18 DIAGNOSIS — K659 Peritonitis, unspecified: Secondary | ICD-10-CM | POA: Diagnosis not present

## 2019-01-18 DIAGNOSIS — N2581 Secondary hyperparathyroidism of renal origin: Secondary | ICD-10-CM | POA: Diagnosis not present

## 2019-01-18 DIAGNOSIS — L732 Hidradenitis suppurativa: Secondary | ICD-10-CM | POA: Diagnosis not present

## 2019-01-18 DIAGNOSIS — E785 Hyperlipidemia, unspecified: Secondary | ICD-10-CM | POA: Diagnosis not present

## 2019-01-18 DIAGNOSIS — K219 Gastro-esophageal reflux disease without esophagitis: Secondary | ICD-10-CM | POA: Diagnosis not present

## 2019-01-18 DIAGNOSIS — I5032 Chronic diastolic (congestive) heart failure: Secondary | ICD-10-CM | POA: Diagnosis not present

## 2019-01-18 DIAGNOSIS — R251 Tremor, unspecified: Secondary | ICD-10-CM | POA: Diagnosis not present

## 2019-01-18 DIAGNOSIS — L0231 Cutaneous abscess of buttock: Secondary | ICD-10-CM | POA: Diagnosis not present

## 2019-01-18 DIAGNOSIS — I25118 Atherosclerotic heart disease of native coronary artery with other forms of angina pectoris: Secondary | ICD-10-CM | POA: Diagnosis not present

## 2019-01-18 DIAGNOSIS — I679 Cerebrovascular disease, unspecified: Secondary | ICD-10-CM | POA: Diagnosis not present

## 2019-01-18 DIAGNOSIS — N186 End stage renal disease: Secondary | ICD-10-CM | POA: Diagnosis not present

## 2019-01-18 DIAGNOSIS — I700 Atherosclerosis of aorta: Secondary | ICD-10-CM | POA: Diagnosis not present

## 2019-01-18 DIAGNOSIS — I132 Hypertensive heart and chronic kidney disease with heart failure and with stage 5 chronic kidney disease, or end stage renal disease: Secondary | ICD-10-CM | POA: Diagnosis not present

## 2019-01-18 DIAGNOSIS — E1143 Type 2 diabetes mellitus with diabetic autonomic (poly)neuropathy: Secondary | ICD-10-CM | POA: Diagnosis not present

## 2019-01-18 DIAGNOSIS — Z794 Long term (current) use of insulin: Secondary | ICD-10-CM | POA: Diagnosis not present

## 2019-01-18 DIAGNOSIS — D509 Iron deficiency anemia, unspecified: Secondary | ICD-10-CM | POA: Diagnosis not present

## 2019-01-18 DIAGNOSIS — D631 Anemia in chronic kidney disease: Secondary | ICD-10-CM | POA: Diagnosis not present

## 2019-01-18 DIAGNOSIS — I0981 Rheumatic heart failure: Secondary | ICD-10-CM | POA: Diagnosis not present

## 2019-01-18 DIAGNOSIS — E440 Moderate protein-calorie malnutrition: Secondary | ICD-10-CM | POA: Diagnosis not present

## 2019-01-18 DIAGNOSIS — L309 Dermatitis, unspecified: Secondary | ICD-10-CM | POA: Diagnosis not present

## 2019-01-18 DIAGNOSIS — Z992 Dependence on renal dialysis: Secondary | ICD-10-CM | POA: Diagnosis not present

## 2019-01-18 DIAGNOSIS — E1151 Type 2 diabetes mellitus with diabetic peripheral angiopathy without gangrene: Secondary | ICD-10-CM | POA: Diagnosis not present

## 2019-01-18 DIAGNOSIS — G43909 Migraine, unspecified, not intractable, without status migrainosus: Secondary | ICD-10-CM | POA: Diagnosis not present

## 2019-01-18 DIAGNOSIS — E1136 Type 2 diabetes mellitus with diabetic cataract: Secondary | ICD-10-CM | POA: Diagnosis not present

## 2019-01-18 DIAGNOSIS — E1142 Type 2 diabetes mellitus with diabetic polyneuropathy: Secondary | ICD-10-CM | POA: Diagnosis not present

## 2019-01-18 DIAGNOSIS — I083 Combined rheumatic disorders of mitral, aortic and tricuspid valves: Secondary | ICD-10-CM | POA: Diagnosis not present

## 2019-01-18 DIAGNOSIS — J45909 Unspecified asthma, uncomplicated: Secondary | ICD-10-CM | POA: Diagnosis not present

## 2019-01-18 DIAGNOSIS — Z4932 Encounter for adequacy testing for peritoneal dialysis: Secondary | ICD-10-CM | POA: Diagnosis not present

## 2019-01-18 DIAGNOSIS — E1122 Type 2 diabetes mellitus with diabetic chronic kidney disease: Secondary | ICD-10-CM | POA: Diagnosis not present

## 2019-01-18 NOTE — Telephone Encounter (Signed)
Called and spoke w/pt regarding needing lipid labs and she stated that she would be able to complete them this friday

## 2019-01-19 ENCOUNTER — Other Ambulatory Visit: Payer: Self-pay

## 2019-01-19 ENCOUNTER — Encounter: Payer: Self-pay | Admitting: Cardiovascular Disease

## 2019-01-19 ENCOUNTER — Other Ambulatory Visit: Payer: Self-pay | Admitting: *Deleted

## 2019-01-19 ENCOUNTER — Ambulatory Visit (INDEPENDENT_AMBULATORY_CARE_PROVIDER_SITE_OTHER): Payer: Medicare Other | Admitting: Cardiovascular Disease

## 2019-01-19 VITALS — BP 153/82 | HR 97 | Ht 65.0 in | Wt 164.0 lb

## 2019-01-19 DIAGNOSIS — E440 Moderate protein-calorie malnutrition: Secondary | ICD-10-CM | POA: Diagnosis not present

## 2019-01-19 DIAGNOSIS — Z992 Dependence on renal dialysis: Secondary | ICD-10-CM | POA: Diagnosis not present

## 2019-01-19 DIAGNOSIS — E785 Hyperlipidemia, unspecified: Secondary | ICD-10-CM | POA: Diagnosis not present

## 2019-01-19 DIAGNOSIS — I739 Peripheral vascular disease, unspecified: Secondary | ICD-10-CM | POA: Diagnosis not present

## 2019-01-19 DIAGNOSIS — D509 Iron deficiency anemia, unspecified: Secondary | ICD-10-CM | POA: Diagnosis not present

## 2019-01-19 DIAGNOSIS — N186 End stage renal disease: Secondary | ICD-10-CM | POA: Diagnosis not present

## 2019-01-19 DIAGNOSIS — Z4932 Encounter for adequacy testing for peritoneal dialysis: Secondary | ICD-10-CM | POA: Diagnosis not present

## 2019-01-19 DIAGNOSIS — Z9861 Coronary angioplasty status: Secondary | ICD-10-CM | POA: Diagnosis not present

## 2019-01-19 DIAGNOSIS — I2510 Atherosclerotic heart disease of native coronary artery without angina pectoris: Secondary | ICD-10-CM

## 2019-01-19 DIAGNOSIS — N2581 Secondary hyperparathyroidism of renal origin: Secondary | ICD-10-CM | POA: Diagnosis not present

## 2019-01-19 DIAGNOSIS — R0602 Shortness of breath: Secondary | ICD-10-CM

## 2019-01-19 MED ORDER — PRALUENT 75 MG/ML ~~LOC~~ SOAJ: 75.0000 mg | SUBCUTANEOUS | 0 refills | Status: DC

## 2019-01-19 NOTE — Patient Instructions (Signed)
Medication Instructions:  Your physician recommends that you continue on your current medications as directed. Please refer to the Current Medication list given to you today.  If you need a refill on your cardiac medications before your next appointment, please call your pharmacy.   Lab work: Fasting Lipids and Liver Panel If you have labs (blood work) drawn today and your tests are completely normal, you will receive your results only by: MyChart Message (if you have MyChart) OR A paper copy in the mail If you have any lab test that is abnormal or we need to change your treatment, we will call you to review the results.  Testing/Procedures: Your physician has requested that you have a carotid duplex in July 2021. This test is an ultrasound of the carotid arteries in your neck. It looks at blood flow through these arteries that supply the brain with blood. Allow one hour for this exam. There are no restrictions or special instructions.  Follow-Up: At [facility], you and your health needs are our priority.  As part of our continuing mission to provide you with exceptional heart care, we have created designated Provider Care Teams.  These Care Teams include your primary Cardiologist (physician) and Advanced Practice Providers (APPs -  Physician Assistants and Nurse Practitioners) who all work together to provide you with the care you need, when you need it. You may see Quay Burow, MD or one of the following Advanced Practice Providers on your designated Care Team:    Kerin Ransom, PA-C  [place], [place]  Coletta Memos,   Your physician wants you to follow-up in: 6 months with a Physicians Assistant  Your physician wants you to follow-up in: 1 year with Dr Gwenlyn Found

## 2019-01-19 NOTE — Assessment & Plan Note (Signed)
When I saw Susan Horton a month ago she was complaining of some shortness of breath.  2D echo was performed 12/22/2018 that showed normal LV systolic function.  A Myoview stress test was low risk and nonischemic.

## 2019-01-19 NOTE — Addendum Note (Signed)
Addended by: Rockne Menghini on: 01/19/2019 02:44 PM   Modules accepted: Orders

## 2019-01-19 NOTE — Telephone Encounter (Signed)
Patient in office to see MD today, states has not been itching since stopping Repatha, and does not recall any itching with first sample of Praluent 75 mg given (gave lower dose due to concern for allergic reaction).    Pt given 1 more sample of Praluent 75 mg, stressed need to get labs today.

## 2019-01-19 NOTE — Progress Notes (Signed)
01/19/2019 [name]   1970/02/08  [id number]  Primary Physician Charlott Rakes, MD Primary Cardiologist: [name] MD [name], [place]  HPI:  Susan Horton is a 49 y.o.  moderately overweight single African-American female mother of 29, grandmother and 4 grandchildren whoI last saw in the office  12/15/2018.She has a history of treated hypertension, hyperlipidemia and diabetes which she's had for the last 22 years. She has CKD 3 with creatinine running in the mid 3 range followed by Dr. Jimmy Footman. She's noticed chest pain over the last 3-4 months occurring several times a week lasting minutes at a time worse with exertion. She had a Myoview stress test was read as intermediate risk with inferior ischemia. Based on this, I decided to proceed with outpatient radial diagnostic coronary angiography using limited contrast. This was performed 08/05/16 the right femoral approach revealed an 80% mid LAD which was stented using a synergy drug-eluting stent reducing 80% stenosis to 0% residual. She did have moderate mid RCA stenosis which was not addressed. She had left main spasm probably related to the guide catheter doing a procedure that responded to intra-coronary nitroglycerin. Her chest pain has resolved and she has stopped smoking since. She did have carotid Dopplers performed because of bruits that showed mild to moderate left ICA stenosis which will be followed on an annual basis. Since I saw her 4 months ago she has done well. She gets occasional chest pain during hemodialysis. She recently had a peritoneal dialysis placed yesterday and is planning on transitioning to home peritoneal dialysis. We have also talked about going on the kidney transplant list although her hemoglobin A1c is still elevated in the 11 range. She has stopped smoking. She was recently begun on Repatha for resistant hyperlipidemiabut unfortunately there was a lapse in her insurance last several months.  She will restart this.  She is tolerating peritoneal dialysis without difficulty.     She has had right SFA intervention by Dr. Carlis Abbott recently.  She had mentioned at her last visit that she was complaining of some mild increase in dyspnea on exertion.  As a result of this, I ordered a 2D echo 12/22/2018 which was normal and a Myoview stress test 12/21/2018 which was normal as well.   Current Meds  Medication Sig  . albuterol (PROVENTIL HFA;VENTOLIN HFA) 108 (90 BASE) MCG/ACT inhaler Inhale 1-2 puffs into the lungs every 6 (six) hours as needed for wheezing or shortness of breath.   [name] amitriptyline (ELAVIL) 10 MG tablet Take 1 tablet (10 mg total) by mouth at bedtime.  [name] aspirin 81 MG chewable tablet Chew 1 tablet (81 mg total) by mouth daily.  . B Complex-C-Zn-Folic Acid (DIALYVITE 035 WITH ZINC) 0.8 MG TABS Take 1 tablet by mouth daily.  . calcitRIOL (ROCALTROL) 0.5 MCG capsule Take 1.5 mcg by mouth daily.  . calcium acetate (PHOSLO) 667 MG capsule Take 1,334-2,001 mg by mouth See admin instructions. Take three capsules (2001 mg) by mouth up to three times daily with meals and two capsules (1334 mg) with snacks  . clopidogrel (PLAVIX) 75 MG tablet Take 1 tablet (75 mg total) by mouth daily with breakfast.  . erythromycin (E-MYCIN) 250 MG tablet Take 1 tablet (250 mg total) by mouth 3 (three) times daily before meals for 14 days.  . Ferrous Sulfate (IRON) 325 (65 Fe) MG TABS Take 325 mg by mouth daily.  . fluconazole (DIFLUCAN) 150 MG tablet Take 1 tablet (150 mg total)  by mouth daily.  . Fluticasone-Salmeterol (ADVAIR) 250-50 MCG/DOSE AEPB Inhale 1 puff into the lungs 2 (two) times daily. (Patient taking differently: Inhale 1 puff into the lungs daily as needed (shortness of breath). )  . gentamicin cream (GARAMYCIN) 0.1 % Apply 1 application topically See admin instructions. Apply to access site nightly  . Insulin Glargine, 1 Unit Dial, (TOUJEO SOLOSTAR) 300 UNIT/ML SOPN Inject 10 Units into  the skin 2 (two) times daily.  . insulin regular (HUMULIN R) 100 units/mL injection Inject 0.2-0.25 mLs (20-25 Units total) into the skin See admin instructions. (Patient taking differently: Inject 7-15 Units into the skin 3 (three) times daily before meals. Per CBG)  . lidocaine-prilocaine (EMLA) cream Apply 1 application topically See admin instructions. Apply small amount to access site (AVF) 30 minutes before hemodialysis, cover with occlusive dressing (Saran Wrap) if going out to dialysis center  . linezolid (ZYVOX) 600 MG tablet Take 1 tablet (600 mg total) by mouth 2 (two) times daily for 14 days.  [name] lovastatin (MEVACOR) 20 MG tablet Take 20 mg by mouth at bedtime.   . methocarbamol (ROBAXIN) 500 MG tablet Take 1 tablet (500 mg total) by mouth 2 (two) times daily as needed for muscle spasms.  . metoCLOPramide (REGLAN) 5 MG tablet TAKE 1 TABLET BY MOUTH 4 TIMES DAILY BEFORE MEALS AND AT BEDTIME. (Patient taking differently: Take 5 mg by mouth 4 (four) times daily -  before meals and at bedtime. )  . metoprolol succinate (TOPROL-XL) 50 MG 24 hr tablet Take 1 tablet (50 mg total) by mouth daily.  . montelukast (SINGULAIR) 10 MG tablet Take 1 tablet (10 mg total) by mouth at bedtime. (Patient taking differently: Take 10 mg by mouth at bedtime as needed (allergies). )  . mupirocin cream (BACTROBAN) 2 % Apply 1 application topically 2 (two) times daily as needed. To affected areas and cover with non-stick gauze. (Patient taking differently: Apply 1 application topically 2 (two) times daily as needed (for infection. Cover with non-stick gauze.). )  . nitroGLYCERIN (NITROSTAT) 0.4 MG SL tablet Place 1 tablet (0.4 mg total) under the tongue every 5 (five) minutes as needed for chest pain.  . pantoprazole (PROTONIX) 40 MG tablet TAKE 1 TABLET BY MOUTH DAILY. (Patient taking differently: Take 40 mg by mouth daily. )  . polyethylene glycol (MIRALAX / GLYCOLAX) 17 g packet Take 17 g by mouth 2 (two) times  daily for 7 days.  . pregabalin (LYRICA) 75 MG capsule Take 1 capsule (75 mg total) by mouth daily.  [name] trimethoprim-polymyxin b (POLYTRIM) ophthalmic solution Place 1 drop into both eyes See admin instructions. Instill one drop into both eyes four times daily for 2 days after each monthly eye injection     Allergies  Allergen Reactions  . Repatha [Evolocumab] Itching  . Lisinopril Cough    Social History   Socioeconomic History  . Marital status: Single    Spouse name: Not on file  . Number of children: 3  . Years of education: 2  . Highest education level: Not on file  Occupational History  . Occupation: Curator  Tobacco Use  . Smoking status: Former Smoker    Years: 20.00    Types: Cigarettes    Quit date: 11/2016    Years since quitting: 2.1  . Smokeless tobacco: Never Used  Substance and Sexual Activity  . Alcohol use: No    Alcohol/week: 0.0 standard drinks  . Drug use: No  . Sexual activity: Not  on file  Other Topics Concern  . Not on file  Social History Narrative   Lives with daughter in a 2 story home.  Has 3 children.  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  Education: college.    Social Determinants of Health   Financial Resource Strain:   . Difficulty of Paying Living Expenses: Not on file  Food Insecurity:   . Worried About Charity fundraiser in the Last Year: Not on file  . Ran Out of Food in the Last Year: Not on file  Transportation Needs:   . Lack of Transportation (Medical): Not on file  . Lack of Transportation (Non-Medical): Not on file  Physical Activity:   . Days of Exercise per Week: Not on file  . Minutes of Exercise per Session: Not on file  Stress:   . Feeling of Stress : Not on file  Social Connections:   . Frequency of Communication with Friends and Family: Not on file  . Frequency of Social Gatherings with Friends and Family: Not on file  . Attends Religious Services: Not on file  . Active  Member of Clubs or Organizations: Not on file  . Attends Archivist Meetings: Not on file  . Marital Status: Not on file  Intimate Partner Violence:   . Fear of Current or Ex-Partner: Not on file  . Emotionally Abused: Not on file  . Physically Abused: Not on file  . Sexually Abused: Not on file     Review of Systems: General: negative for chills, fever, night sweats or weight changes.  Cardiovascular: negative for chest pain, dyspnea on exertion, edema, orthopnea, palpitations, paroxysmal nocturnal dyspnea or shortness of breath Dermatological: negative for rash Respiratory: negative for cough or wheezing Urologic: negative for hematuria Abdominal: negative for nausea, vomiting, diarrhea, bright red blood per rectum, melena, or hematemesis Neurologic: negative for visual changes, syncope, or dizziness All other systems reviewed and are otherwise negative except as noted above.    Blood pressure (!) 153/82, pulse 97, height 5\' 5"  (1.651 m), weight 164 lb (74.4 kg), SpO2 100 %.  General appearance: alert and no distress Neck: no adenopathy, no JVD, supple, symmetrical, trachea midline, thyroid not enlarged, symmetric, no tenderness/mass/nodules and Soft bilateral carotid bruits Lungs: clear to auscultation bilaterally Heart: Soft outflow tract murmur Extremities: extremities normal, atraumatic, no cyanosis or edema Pulses: 2+ and symmetric Skin: Skin color, texture, turgor normal. No rashes or lesions Neurologic: Alert and oriented X 3, normal strength and tone. Normal symmetric reflexes. Normal coordination and gait  EKG not performed today  ASSESSMENT AND PLAN:   Shortness of breath When I saw Ms. Earnhart a month ago she was complaining of some shortness of breath.  2D echo was performed 12/22/2018 that showed normal LV systolic function.  A Myoview stress test was low risk and nonischemic.      [name] MD FACP,FACC,FAHA, [facility] 01/19/2019 2:35 PM

## 2019-01-20 DIAGNOSIS — D509 Iron deficiency anemia, unspecified: Secondary | ICD-10-CM | POA: Diagnosis not present

## 2019-01-20 DIAGNOSIS — Z4932 Encounter for adequacy testing for peritoneal dialysis: Secondary | ICD-10-CM | POA: Diagnosis not present

## 2019-01-20 DIAGNOSIS — N2581 Secondary hyperparathyroidism of renal origin: Secondary | ICD-10-CM | POA: Diagnosis not present

## 2019-01-20 DIAGNOSIS — N186 End stage renal disease: Secondary | ICD-10-CM | POA: Diagnosis not present

## 2019-01-20 DIAGNOSIS — E440 Moderate protein-calorie malnutrition: Secondary | ICD-10-CM | POA: Diagnosis not present

## 2019-01-20 DIAGNOSIS — Z992 Dependence on renal dialysis: Secondary | ICD-10-CM | POA: Diagnosis not present

## 2019-01-20 LAB — LIPID PANEL
Chol/HDL Ratio: 4.2 ratio (ref 0.0–4.4)
Cholesterol, Total: 152 mg/dL (ref 100–199)
HDL: 36 mg/dL — ABNORMAL LOW (ref >39)
LDL Chol Calc (NIH): 90 mg/dL (ref 0–99)
Triglycerides: 149 mg/dL (ref 0–149)
VLDL Cholesterol Cal: 26 mg/dL (ref 5–40)

## 2019-01-20 LAB — HEPATIC FUNCTION PANEL
ALT: 25 IU/L (ref 0–32)
AST: 13 IU/L (ref 0–40)
Albumin: 2.7 g/dL — ABNORMAL LOW (ref 3.8–4.8)
Alkaline Phosphatase: 185 IU/L — ABNORMAL HIGH (ref 39–117)
Bilirubin Total: 0.3 mg/dL (ref 0.0–1.2)
Bilirubin, Direct: 0.14 mg/dL (ref 0.00–0.40)
Total Protein: 6.1 g/dL (ref 6.0–8.5)

## 2019-01-21 DIAGNOSIS — N186 End stage renal disease: Secondary | ICD-10-CM | POA: Diagnosis not present

## 2019-01-21 DIAGNOSIS — Z4932 Encounter for adequacy testing for peritoneal dialysis: Secondary | ICD-10-CM | POA: Diagnosis not present

## 2019-01-21 DIAGNOSIS — N2581 Secondary hyperparathyroidism of renal origin: Secondary | ICD-10-CM | POA: Diagnosis not present

## 2019-01-21 DIAGNOSIS — Z992 Dependence on renal dialysis: Secondary | ICD-10-CM | POA: Diagnosis not present

## 2019-01-21 DIAGNOSIS — E440 Moderate protein-calorie malnutrition: Secondary | ICD-10-CM | POA: Diagnosis not present

## 2019-01-21 DIAGNOSIS — D509 Iron deficiency anemia, unspecified: Secondary | ICD-10-CM | POA: Diagnosis not present

## 2019-01-22 ENCOUNTER — Telehealth: Payer: Self-pay

## 2019-01-22 DIAGNOSIS — N2581 Secondary hyperparathyroidism of renal origin: Secondary | ICD-10-CM | POA: Diagnosis not present

## 2019-01-22 DIAGNOSIS — Z992 Dependence on renal dialysis: Secondary | ICD-10-CM | POA: Diagnosis not present

## 2019-01-22 DIAGNOSIS — N186 End stage renal disease: Secondary | ICD-10-CM | POA: Diagnosis not present

## 2019-01-22 DIAGNOSIS — E440 Moderate protein-calorie malnutrition: Secondary | ICD-10-CM | POA: Diagnosis not present

## 2019-01-22 DIAGNOSIS — Z4932 Encounter for adequacy testing for peritoneal dialysis: Secondary | ICD-10-CM | POA: Diagnosis not present

## 2019-01-22 DIAGNOSIS — D509 Iron deficiency anemia, unspecified: Secondary | ICD-10-CM | POA: Diagnosis not present

## 2019-01-22 LAB — CULTURE, FUNGUS WITHOUT SMEAR

## 2019-01-22 NOTE — Telephone Encounter (Signed)
Called to give results and stated "call did not go through".

## 2019-01-22 NOTE — Telephone Encounter (Signed)
-----   Message from [name], MD sent at 01/22/2019 11:15 AM EST ----- Not at goal for secondary prevention.  Discontinue lovastatin and start atorvastatin 40 mg a day, recheck lipid liver profile in 2 months

## 2019-01-23 ENCOUNTER — Telehealth: Payer: Self-pay

## 2019-01-23 DIAGNOSIS — N186 End stage renal disease: Secondary | ICD-10-CM | POA: Diagnosis not present

## 2019-01-23 DIAGNOSIS — D509 Iron deficiency anemia, unspecified: Secondary | ICD-10-CM | POA: Diagnosis not present

## 2019-01-23 DIAGNOSIS — E440 Moderate protein-calorie malnutrition: Secondary | ICD-10-CM | POA: Diagnosis not present

## 2019-01-23 DIAGNOSIS — Z992 Dependence on renal dialysis: Secondary | ICD-10-CM | POA: Diagnosis not present

## 2019-01-23 DIAGNOSIS — E785 Hyperlipidemia, unspecified: Secondary | ICD-10-CM

## 2019-01-23 DIAGNOSIS — Z4932 Encounter for adequacy testing for peritoneal dialysis: Secondary | ICD-10-CM | POA: Diagnosis not present

## 2019-01-23 DIAGNOSIS — N2581 Secondary hyperparathyroidism of renal origin: Secondary | ICD-10-CM | POA: Diagnosis not present

## 2019-01-23 MED ORDER — ATORVASTATIN CALCIUM 40 MG PO TABS: 40.0000 mg | ORAL_TABLET | Freq: Every day | ORAL | 3 refills | Status: DC

## 2019-01-23 NOTE — Telephone Encounter (Signed)
Called pt and gave results. Put in new orders. Verbalized understanding.

## 2019-01-23 NOTE — Telephone Encounter (Signed)
-----   Message from [name], MD sent at 01/22/2019 11:15 AM EST ----- Not at goal for secondary prevention.  Discontinue lovastatin and start atorvastatin 40 mg a day, recheck lipid liver profile in 2 months

## 2019-01-24 ENCOUNTER — Other Ambulatory Visit: Payer: Self-pay

## 2019-01-24 ENCOUNTER — Telehealth: Payer: Self-pay

## 2019-01-24 ENCOUNTER — Ambulatory Visit: Payer: Medicare Other | Attending: Family Medicine | Admitting: Family Medicine

## 2019-01-24 DIAGNOSIS — E1165 Type 2 diabetes mellitus with hyperglycemia: Secondary | ICD-10-CM

## 2019-01-24 DIAGNOSIS — N2581 Secondary hyperparathyroidism of renal origin: Secondary | ICD-10-CM | POA: Diagnosis not present

## 2019-01-24 DIAGNOSIS — M4628 Osteomyelitis of vertebra, sacral and sacrococcygeal region: Secondary | ICD-10-CM | POA: Diagnosis not present

## 2019-01-24 DIAGNOSIS — Z9861 Coronary angioplasty status: Secondary | ICD-10-CM

## 2019-01-24 DIAGNOSIS — I2510 Atherosclerotic heart disease of native coronary artery without angina pectoris: Secondary | ICD-10-CM | POA: Diagnosis not present

## 2019-01-24 DIAGNOSIS — E1142 Type 2 diabetes mellitus with diabetic polyneuropathy: Secondary | ICD-10-CM

## 2019-01-24 DIAGNOSIS — IMO0002 Reserved for concepts with insufficient information to code with codable children: Secondary | ICD-10-CM

## 2019-01-24 DIAGNOSIS — Z992 Dependence on renal dialysis: Secondary | ICD-10-CM | POA: Diagnosis not present

## 2019-01-24 DIAGNOSIS — N186 End stage renal disease: Secondary | ICD-10-CM | POA: Diagnosis not present

## 2019-01-24 DIAGNOSIS — D509 Iron deficiency anemia, unspecified: Secondary | ICD-10-CM | POA: Diagnosis not present

## 2019-01-24 DIAGNOSIS — E440 Moderate protein-calorie malnutrition: Secondary | ICD-10-CM | POA: Diagnosis not present

## 2019-01-24 DIAGNOSIS — Z4932 Encounter for adequacy testing for peritoneal dialysis: Secondary | ICD-10-CM | POA: Diagnosis not present

## 2019-01-24 MED ORDER — PRALUENT 75 MG/ML ~~LOC~~ SOAJ: 75.0000 mg | SUBCUTANEOUS | 11 refills | Status: DC

## 2019-01-24 NOTE — Progress Notes (Signed)
Feet pain

## 2019-01-24 NOTE — Telephone Encounter (Signed)
Called and spoke w/pt about switching to praluent and the rx was sent, pt voiced understanding and instructed the pt to call back if unaffordable

## 2019-01-24 NOTE — Progress Notes (Signed)
Virtual Visit via Telephone Note  I connected with Susan Horton, on 01/24/2019 at 4:30 PM by telephone due to the COVID-19 pandemic and verified that I am speaking with the correct person using two identifiers.   Consent: I discussed the limitations, risks, security and privacy concerns of performing an evaluation and management service by telephone and the availability of in person appointments. I also discussed with the patient that there may be a patient responsible charge related to this service. The patient expressed understanding and agreed to proceed.   Location of Patient: Home  Location of Provider: Clinic   Persons participating in Telemedicine visit: Jerilee Field Farrington-CMA Dr. Margarita Rana     History of Present Illness: Susan Horton is a 49 year old female with a history of Type 2 Diabetes Mellitus (A1c 7.5), Hypertension, CAD, ESRD on hemodialysis, Asthma s/p hospitalization with I&D of sacral abscess. Blood culture revealed No growth to date. Abscess carvity is healing well, she has a HH nurse, no fever and will be seeing Gen Surgery for follow up.   She is sore in her feet from her Neuropathy.She needs an earlier appointment with Horton sees Dr Posey Pronto of Susan Horton as she has an appointment scheduled for March and her symptoms are uncontrolled on Lyrica which she receives from her pain doctor. Diabetes is managed by Dr Renne Crigler of Endocrine  With regards to her Diabetes she has an improvement in her A1c to 7.5 down from 8.3. Ophthalmology visit occurs month  With Dr Rodman Key. Hemodialysis sessions are going well and she is closely followed by her Cardiologist Dr Gwenlyn Found for her CAD, last visit on 01/18/18 - notes reviewed. Myoview stress test from 12/2018 - no evidence of ischemia. Echo 12/2018 - LVEF 60-65%, LVH.   Past Medical History:  Diagnosis Date  . Abnormal stress test   . Anemia   . Angio-edema   . Asthma   . CAD (coronary artery disease)     DES to mid LAD July 2018, residual moderate RCA disease  . Cataract   . CKD (chronic kidney disease) stage 4, GFR 15-29 ml/min (HCC)    Dialysis T/Th/Sa  . DVT (deep venous thrombosis) ([place])    1996 during pregnancy, 2015 left leg  . Eczema   . Essential hypertension 12/11/2015  . Gastroparesis   . GERD (gastroesophageal reflux disease)   . Gluteal abscess 12/27/2018  . Hidradenitis   . Migraine   . Neuropathy   . Peripheral vascular disease ([place])    blood clot in leg  . Progressive angina ([place]) 06/05/2014   Chest pain  . Type 2 diabetes mellitus ([place])   . Type II diabetes mellitus ([place])   . Urticaria    Allergies  Allergen Reactions  . Repatha [Evolocumab] Itching  . Lisinopril Cough    Current Outpatient Medications on File Prior to Visit  Medication Sig Dispense Refill  . albuterol (PROVENTIL HFA;VENTOLIN HFA) 108 (90 BASE) MCG/ACT inhaler Inhale 1-2 puffs into the lungs every 6 (six) hours as needed for wheezing or shortness of breath.     [name] amitriptyline (ELAVIL) 10 MG tablet Take 1 tablet (10 mg total) by mouth at bedtime. 30 tablet 1  . aspirin 81 MG chewable tablet Chew 1 tablet (81 mg total) by mouth daily. 30 tablet 0  . atorvastatin (LIPITOR) 40 MG tablet Take 1 tablet (40 mg total) by mouth daily. 90 tablet 3  . B Complex-C-Zn-Folic Acid (DIALYVITE 572 WITH ZINC) 0.8 MG  TABS Take 1 tablet by mouth daily.    . calcitRIOL (ROCALTROL) 0.5 MCG capsule Take 1.5 mcg by mouth daily.    . calcium acetate (PHOSLO) 667 MG capsule Take 1,334-2,001 mg by mouth See admin instructions. Take three capsules (2001 mg) by mouth up to three times daily with meals and two capsules (1334 mg) with snacks  10  . clopidogrel (PLAVIX) 75 MG tablet Take 1 tablet (75 mg total) by mouth daily with breakfast. 90 tablet 3  . erythromycin (E-MYCIN) 250 MG tablet Take 1 tablet (250 mg total) by mouth 3 (three) times daily before meals for 14 days. 42 tablet 0  . Ferrous Sulfate (IRON) 325 (65  Fe) MG TABS Take 325 mg by mouth daily. 30 each 3  . fluconazole (DIFLUCAN) 150 MG tablet Take 1 tablet (150 mg total) by mouth daily. 1 tablet 0  . Fluticasone-Salmeterol (ADVAIR) 250-50 MCG/DOSE AEPB Inhale 1 puff into the lungs 2 (two) times daily. (Patient taking differently: Inhale 1 puff into the lungs daily as needed (shortness of breath). ) 60 each 3  . gentamicin cream (GARAMYCIN) 0.1 % Apply 1 application topically See admin instructions. Apply to access site nightly    . Insulin Glargine, 1 Unit Dial, (TOUJEO SOLOSTAR) 300 UNIT/ML SOPN Inject 10 Units into the skin 2 (two) times daily. 12 pen 3  . insulin regular (HUMULIN R) 100 units/mL injection Inject 0.2-0.25 mLs (20-25 Units total) into the skin See admin instructions. (Patient taking differently: Inject 7-15 Units into the skin 3 (three) times daily before meals. Per CBG) 45 mL 3  . lidocaine-prilocaine (EMLA) cream Apply 1 application topically See admin instructions. Apply small amount to access site (AVF) 30 minutes before hemodialysis, cover with occlusive dressing (Saran Wrap) if going out to dialysis center  5  . linezolid (ZYVOX) 600 MG tablet Take 1 tablet (600 mg total) by mouth 2 (two) times daily for 14 days. 4 tablet 0  . methocarbamol (ROBAXIN) 500 MG tablet Take 1 tablet (500 mg total) by mouth 2 (two) times daily as needed for muscle spasms. 60 tablet 1  . metoCLOPramide (REGLAN) 5 MG tablet TAKE 1 TABLET BY MOUTH 4 TIMES DAILY BEFORE MEALS AND AT BEDTIME. (Patient taking differently: Take 5 mg by mouth 4 (four) times daily -  before meals and at bedtime. ) 90 tablet 0  . metoprolol succinate (TOPROL-XL) 50 MG 24 hr tablet Take 1 tablet (50 mg total) by mouth daily. 90 tablet 3  . montelukast (SINGULAIR) 10 MG tablet Take 1 tablet (10 mg total) by mouth at bedtime. (Patient taking differently: Take 10 mg by mouth at bedtime as needed (allergies). ) 30 tablet 5  . mupirocin cream (BACTROBAN) 2 % Apply 1 application  topically 2 (two) times daily as needed. To affected areas and cover with non-stick gauze. (Patient taking differently: Apply 1 application topically 2 (two) times daily as needed (for infection. Cover with non-stick gauze.). ) 15 g 0  . nitroGLYCERIN (NITROSTAT) 0.4 MG SL tablet Place 1 tablet (0.4 mg total) under the tongue every 5 (five) minutes as needed for chest pain. 25 tablet 2  . pantoprazole (PROTONIX) 40 MG tablet TAKE 1 TABLET BY MOUTH DAILY. (Patient taking differently: Take 40 mg by mouth daily. ) 30 tablet 7  . pregabalin (LYRICA) 75 MG capsule Take 1 capsule (75 mg total) by mouth daily. 30 capsule 1  . trimethoprim-polymyxin b (POLYTRIM) ophthalmic solution Place 1 drop into both eyes See admin instructions.  Instill one drop into both eyes four times daily for 2 days after each monthly eye injection  12  . isosorbide mononitrate (IMDUR) 30 MG 24 hr tablet Take 0.5 tablets (15 mg total) by mouth daily. 45 tablet 3   No current facility-administered medications on file prior to visit.    Observations/Objective: Awake, alert, oriented x3 Not in acute distress  Lab Results  Component Value Date   HGBA1C 7.5 (H) 12/26/2018    Assessment and Plan: 1. Uncontrolled type 2 diabetes mellitus with peripheral neuropathy (HCC) A1c has improved to 7.5 Management as per Endocrine Neuropathy uncontrolled on Lyrica Provided Number to neurologist office to call for an appointment Continue Diabetic diet, lifestyle modifications  2. Abscess of sacrum ([place]) S/p I and D Healing well Continue home dressings and follow up with Gen Surgeon  3. CAD S/P mLAD PCI with DES Stable No evidence of ischemia on recent Myoview Risk factor modification Follow up with cardiology   Follow Up Instructions: 6 months   I discussed the assessment and treatment plan with the patient. The patient was provided an opportunity to ask questions and all were answered. The patient agreed with the plan and  demonstrated an understanding of the instructions.   The patient was advised to call back or seek an in-person evaluation if the symptoms worsen or if the condition fails to improve as anticipated.     I provided 19 minutes total of non-face-to-face time during this encounter including median intraservice time, reviewing previous notes, investigations, ordering medications, medical decision making, coordinating care and patient verbalized understanding at the end of the visit.     Charlott Rakes, MD, FAAFP. [facility] [place], [place]   01/24/2019, 4:30 PM

## 2019-01-25 ENCOUNTER — Telehealth: Payer: Self-pay

## 2019-01-25 ENCOUNTER — Encounter: Payer: Self-pay | Admitting: Family Medicine

## 2019-01-25 DIAGNOSIS — E440 Moderate protein-calorie malnutrition: Secondary | ICD-10-CM | POA: Diagnosis not present

## 2019-01-25 DIAGNOSIS — N186 End stage renal disease: Secondary | ICD-10-CM | POA: Diagnosis not present

## 2019-01-25 DIAGNOSIS — D509 Iron deficiency anemia, unspecified: Secondary | ICD-10-CM | POA: Diagnosis not present

## 2019-01-25 DIAGNOSIS — N2581 Secondary hyperparathyroidism of renal origin: Secondary | ICD-10-CM | POA: Diagnosis not present

## 2019-01-25 DIAGNOSIS — Z992 Dependence on renal dialysis: Secondary | ICD-10-CM | POA: Diagnosis not present

## 2019-01-25 DIAGNOSIS — Z4932 Encounter for adequacy testing for peritoneal dialysis: Secondary | ICD-10-CM | POA: Diagnosis not present

## 2019-01-25 NOTE — Telephone Encounter (Signed)
COVID-19 Pre-Screening Questions:01/25/19   Do you currently have a fever (>100 F), chills or unexplained body aches? NO   Are you currently experiencing new cough, shortness of breath, sore throat, runny nose? NO  .  Have you recently travelled outside the state of [place] in the last 14 days? NO  .  Have you been in contact with someone that is currently pending confirmation of Covid19 testing or has been confirmed to have the [place] virus? NO  **If the patient answers NO to ALL questions -  advise the patient to please call the clinic before coming to the office should any symptoms develop.

## 2019-01-26 ENCOUNTER — Ambulatory Visit (INDEPENDENT_AMBULATORY_CARE_PROVIDER_SITE_OTHER): Payer: Medicare Other | Admitting: Infectious Diseases

## 2019-01-26 ENCOUNTER — Encounter: Payer: Self-pay | Admitting: Infectious Diseases

## 2019-01-26 ENCOUNTER — Other Ambulatory Visit: Payer: Self-pay

## 2019-01-26 DIAGNOSIS — N186 End stage renal disease: Secondary | ICD-10-CM | POA: Diagnosis not present

## 2019-01-26 DIAGNOSIS — K3184 Gastroparesis: Secondary | ICD-10-CM

## 2019-01-26 DIAGNOSIS — L0231 Cutaneous abscess of buttock: Secondary | ICD-10-CM | POA: Diagnosis not present

## 2019-01-26 DIAGNOSIS — K659 Peritonitis, unspecified: Secondary | ICD-10-CM

## 2019-01-26 DIAGNOSIS — N2581 Secondary hyperparathyroidism of renal origin: Secondary | ICD-10-CM | POA: Diagnosis not present

## 2019-01-26 DIAGNOSIS — E440 Moderate protein-calorie malnutrition: Secondary | ICD-10-CM | POA: Diagnosis not present

## 2019-01-26 DIAGNOSIS — I2510 Atherosclerotic heart disease of native coronary artery without angina pectoris: Secondary | ICD-10-CM

## 2019-01-26 DIAGNOSIS — D509 Iron deficiency anemia, unspecified: Secondary | ICD-10-CM | POA: Diagnosis not present

## 2019-01-26 DIAGNOSIS — Z4932 Encounter for adequacy testing for peritoneal dialysis: Secondary | ICD-10-CM | POA: Diagnosis not present

## 2019-01-26 DIAGNOSIS — Z9861 Coronary angioplasty status: Secondary | ICD-10-CM

## 2019-01-26 DIAGNOSIS — Z992 Dependence on renal dialysis: Secondary | ICD-10-CM | POA: Diagnosis not present

## 2019-01-26 NOTE — Patient Instructions (Signed)
Please continue your antibiotic pills for now.   Plan is to see what your surgeon thinks Monday so we can decide if we need to set you up for the infusion antibiotic.   Please give [name] a call Tuesday morning when you wake up and get going so I can chat with you.

## 2019-01-26 NOTE — Assessment & Plan Note (Addendum)
She still has a good amount of pain. She was not able to allow me to look at wound with time constraints today unfortunately but from her description it does not have any worrisome signs that she still has ongoing infection. She is going to give me a call Tuesday morning when she gets up so we can discuss how her visit went with her surgeon. I think the next best course of action will be to arrange 1-2 doses of IV dalbavancin to provide same spectrum of coverage as linezolid parenterally.  Operative report indicated no contiguous findings with bone so treating this as a deep soft tissue infection.  She will continue her linezolid to complete the 14d plan.

## 2019-01-26 NOTE — Assessment & Plan Note (Signed)
She was asymptomatic for this however met criteria for peritonitis with cell count. No symptoms today.

## 2019-01-26 NOTE — Assessment & Plan Note (Signed)
Severe - I am sure the pressure from PD makes this even more difficult to control. She is worried that her antibiotics may not have been effective because of this constant vomiting. She has been doing her best to pre-medicate with reglan, etc.

## 2019-01-26 NOTE — Progress Notes (Signed)
Patient: Susan Horton  DOB: 1970-01-27 MRN: [id number] PCP: Charlott Rakes, MD    Patient Active Problem List   Diagnosis Date Noted  . Shortness of breath 01/19/2019  . Cerebrovascular small vessel disease   . Generalized weakness   . Abscess of sacrum ([place])   . Gluteal abscess 12/25/2018  . Symptomatic anemia 09/16/2018  . Peripheral vascular disease, unspecified ([place]) 09/04/2018  . ESRD on peritoneal dialysis ([place]) 10/11/2017  . Chronic diastolic CHF (congestive heart failure) ([place]) 06/18/2017  . Moderate persistent asthma 06/10/2017  . CKD (chronic kidney disease) stage 4, GFR 15-29 ml/min (HCC)   . CAD S/P mLAD PCI with DES 08/06/2016  . Presence of drug coated stent in LAD coronary artery 08/06/2016  . Carotid bruit present 06/15/2016  . Dyslipidemia 06/15/2016  . Smoker 06/15/2016  . Neuropathy 03/10/2016  . Essential hypertension, benign 12/11/2015  . Gastroparesis   . Uncontrolled type 2 diabetes mellitus with peripheral neuropathy ([place]) 06/05/2014     Subjective:  Susan Horton is a 49 y.o. female with T2DM, ESRD on PD,   She was admitted twice in December following a fall to the sacrum with what was identified as possible sacral osteomyelitis on imaging. She was treated with broad empiric IV antibiotics and narrowed to linezolid as it was a suspected infected hematoma. Required readmission due to pain - repeat imaging revealed a drainable abscess that was debrided 12/31. Bone was unremarkable. Intraoperative cultures were negative and she was discharged on 14 more days of linezolid.    She has had a persistent leukocytosis that pre-dates these admissions and planning outpatient follow up with Heme-onc.   Since hospital discharge Shaquala has unfortunately had a hard time keeping her pills down.  She has severe gastroparesis and has been waiting to get into see a specialist in [place], however Covid pandemic has delayed this significantly.  She does not feel  like the linezolid has increased her vomiting she just has at baseline a very difficult to manage condition, especially given her peritoneal dialysis.  She still has a fair amount of pain in the buttocks where she had surgery.  She was upset today because of the longer wait time especially due to the pain while sitting.  She is unable to allow me to visualize her wound today due to time constraints and pain however she does report that there is no erythema, no foul odor, no swelling of the skin.  She does still have some drainage but it is clear.  She is packing the wound with what sounds to be iodoform daily.  She has a follow-up with her surgeon on Monday at 4 PM.  CRP 22.8 2 weeks ago  Review of Systems  Constitutional: Negative for chills, fever, malaise/fatigue and weight loss.  HENT: Negative for sore throat.   Respiratory: Negative for cough and sputum production.   Cardiovascular: Negative for chest pain and leg swelling.  Gastrointestinal: Positive for vomiting. Negative for abdominal pain and diarrhea.  Genitourinary: Negative for dysuria and flank pain.  Musculoskeletal: Positive for back pain. Negative for joint pain, myalgias and neck pain.  Skin: Negative for rash.  Neurological: Negative for dizziness, tingling and headaches.  Psychiatric/Behavioral: Negative for depression and substance abuse. The patient is not nervous/anxious and does not have insomnia.     Past Medical History:  Diagnosis Date  . Abnormal stress test   . Anemia   . Angio-edema   . Asthma   . CAD (coronary  artery disease)    DES to mid LAD July 2018, residual moderate RCA disease  . Cataract   . CKD (chronic kidney disease) stage 4, GFR 15-29 ml/min (HCC)    Dialysis T/Th/Sa  . DVT (deep venous thrombosis) ([place])    1996 during pregnancy, 2015 left leg  . Eczema   . Essential hypertension 12/11/2015  . Gastroparesis   . GERD (gastroesophageal reflux disease)   . Gluteal abscess 12/27/2018  .  Hidradenitis   . Migraine   . Neuropathy   . Peripheral vascular disease ([place])    blood clot in leg  . Progressive angina ([place]) 06/05/2014   Chest pain  . Type 2 diabetes mellitus ([place])   . Type II diabetes mellitus ([place])   . Urticaria     Outpatient Medications Prior to Visit  Medication Sig Dispense Refill  . linezolid (ZYVOX) 600 MG tablet Take 1 tablet (600 mg total) by mouth 2 (two) times daily for 14 days. 4 tablet 0  . albuterol (PROVENTIL HFA;VENTOLIN HFA) 108 (90 BASE) MCG/ACT inhaler Inhale 1-2 puffs into the lungs every 6 (six) hours as needed for wheezing or shortness of breath.     . Alirocumab (PRALUENT) 75 MG/ML SOAJ Inject 75 mg into the skin every 14 (fourteen) days. 2 pen 11  . amitriptyline (ELAVIL) 10 MG tablet Take 1 tablet (10 mg total) by mouth at bedtime. 30 tablet 1  . aspirin 81 MG chewable tablet Chew 1 tablet (81 mg total) by mouth daily. 30 tablet 0  . atorvastatin (LIPITOR) 40 MG tablet Take 1 tablet (40 mg total) by mouth daily. 90 tablet 3  . B Complex-C-Zn-Folic Acid (DIALYVITE 376 WITH ZINC) 0.8 MG TABS Take 1 tablet by mouth daily.    . calcitRIOL (ROCALTROL) 0.5 MCG capsule Take 1.5 mcg by mouth daily.    . calcium acetate (PHOSLO) 667 MG capsule Take 1,334-2,001 mg by mouth See admin instructions. Take three capsules (2001 mg) by mouth up to three times daily with meals and two capsules (1334 mg) with snacks  10  . clopidogrel (PLAVIX) 75 MG tablet Take 1 tablet (75 mg total) by mouth daily with breakfast. 90 tablet 3  . erythromycin (E-MYCIN) 250 MG tablet Take 1 tablet (250 mg total) by mouth 3 (three) times daily before meals for 14 days. 42 tablet 0  . Ferrous Sulfate (IRON) 325 (65 Fe) MG TABS Take 325 mg by mouth daily. 30 each 3  . fluconazole (DIFLUCAN) 150 MG tablet Take 1 tablet (150 mg total) by mouth daily. 1 tablet 0  . Fluticasone-Salmeterol (ADVAIR) 250-50 MCG/DOSE AEPB Inhale 1 puff into the lungs 2 (two) times daily. (Patient taking  differently: Inhale 1 puff into the lungs daily as needed (shortness of breath). ) 60 each 3  . gentamicin cream (GARAMYCIN) 0.1 % Apply 1 application topically See admin instructions. Apply to access site nightly    . Insulin Glargine, 1 Unit Dial, (TOUJEO SOLOSTAR) 300 UNIT/ML SOPN Inject 10 Units into the skin 2 (two) times daily. 12 pen 3  . insulin regular (HUMULIN R) 100 units/mL injection Inject 0.2-0.25 mLs (20-25 Units total) into the skin See admin instructions. (Patient taking differently: Inject 7-15 Units into the skin 3 (three) times daily before meals. Per CBG) 45 mL 3  . isosorbide mononitrate (IMDUR) 30 MG 24 hr tablet Take 0.5 tablets (15 mg total) by mouth daily. 45 tablet 3  . lidocaine-prilocaine (EMLA) cream Apply 1 application topically See admin instructions. Apply  small amount to access site (AVF) 30 minutes before hemodialysis, cover with occlusive dressing (Saran Wrap) if going out to dialysis center  5  . methocarbamol (ROBAXIN) 500 MG tablet Take 1 tablet (500 mg total) by mouth 2 (two) times daily as needed for muscle spasms. 60 tablet 1  . metoCLOPramide (REGLAN) 5 MG tablet TAKE 1 TABLET BY MOUTH 4 TIMES DAILY BEFORE MEALS AND AT BEDTIME. (Patient taking differently: Take 5 mg by mouth 4 (four) times daily -  before meals and at bedtime. ) 90 tablet 0  . metoprolol succinate (TOPROL-XL) 50 MG 24 hr tablet Take 1 tablet (50 mg total) by mouth daily. 90 tablet 3  . montelukast (SINGULAIR) 10 MG tablet Take 1 tablet (10 mg total) by mouth at bedtime. (Patient taking differently: Take 10 mg by mouth at bedtime as needed (allergies). ) 30 tablet 5  . mupirocin cream (BACTROBAN) 2 % Apply 1 application topically 2 (two) times daily as needed. To affected areas and cover with non-stick gauze. (Patient taking differently: Apply 1 application topically 2 (two) times daily as needed (for infection. Cover with non-stick gauze.). ) 15 g 0  . nitroGLYCERIN (NITROSTAT) 0.4 MG SL tablet  Place 1 tablet (0.4 mg total) under the tongue every 5 (five) minutes as needed for chest pain. 25 tablet 2  . pantoprazole (PROTONIX) 40 MG tablet TAKE 1 TABLET BY MOUTH DAILY. (Patient taking differently: Take 40 mg by mouth daily. ) 30 tablet 7  . pregabalin (LYRICA) 75 MG capsule Take 1 capsule (75 mg total) by mouth daily. 30 capsule 1  . trimethoprim-polymyxin b (POLYTRIM) ophthalmic solution Place 1 drop into both eyes See admin instructions. Instill one drop into both eyes four times daily for 2 days after each monthly eye injection  12   No facility-administered medications prior to visit.     Allergies  Allergen Reactions  . Repatha [Evolocumab] Itching  . Lisinopril Cough    Social History   Tobacco Use  . Smoking status: Former Smoker    Years: 20.00    Types: Cigarettes    Quit date: 11/2016    Years since quitting: 2.2  . Smokeless tobacco: Never Used  Substance Use Topics  . Alcohol use: No    Alcohol/week: 0.0 standard drinks  . Drug use: No    Family History  Problem Relation Age of Onset  . Allergic rhinitis Mother   . Hypertension Father   . Allergic rhinitis Father   . Hypertension Sister   . Allergic rhinitis Sister   . Hypertension Brother   . Allergic rhinitis Brother   . Breast cancer Cousin        x2    Objective:   Vitals:   01/26/19 1009  BP: (!) 150/78  Pulse: 96  Weight: 153 lb (69.4 kg)   Body mass index is 25.46 kg/m.  Physical Exam Vitals reviewed.  Constitutional:      Appearance: She is well-developed.     Comments: Appears frustrated and uncomfortable.   HENT:     Mouth/Throat:     Mouth: No oral lesions.     Dentition: Normal dentition. No dental abscesses.  Pulmonary:     Effort: Pulmonary effort is normal.  Musculoskeletal:     Comments: She was not able to allow me to examine her wound today.   Neurological:     Mental Status: She is alert and oriented to person, place, and time.  Psychiatric:  Judgment:  Judgment normal.     Comments: She is a bit angry with [name] today. In a lot of pain and uncomfortable in the chair.      Lab Results: Lab Results  Component Value Date   WBC 18.5 (H) 01/14/2019   HGB 7.7 (L) 01/14/2019   HCT 25.0 (L) 01/14/2019   MCV 89.9 01/14/2019   PLT 454 (H) 01/14/2019    Lab Results  Component Value Date   CREATININE 9.98 (H) 01/14/2019   BUN 109 (H) 01/14/2019   NA 134 (L) 01/14/2019   K 4.5 01/14/2019   CL 98 01/14/2019   CO2 24 01/14/2019    Lab Results  Component Value Date   ALT 25 01/19/2019   AST 13 01/19/2019   ALKPHOS 185 (H) 01/19/2019   BILITOT 0.3 01/19/2019     Assessment & Plan:   Problem List Items Addressed This Visit      Unprioritized   RESOLVED: Peritonitis ([place])    She was asymptomatic for this however met criteria for peritonitis with cell count. No symptoms today.       Gluteal abscess    She still has a good amount of pain. She was not able to allow me to look at wound with time constraints today unfortunately but from her description it does not have any worrisome signs that she still has ongoing infection. She is going to give me a call Tuesday morning when she gets up so we can discuss how her visit went with her surgeon. I think the next best course of action will be to arrange 1-2 doses of IV dalbavancin to provide same spectrum of coverage as linezolid parenterally.  Operative report indicated no contiguous findings with bone so treating this as a deep soft tissue infection.  She will continue her linezolid to complete the 14d plan.       Gastroparesis    Severe - I am sure the pressure from PD makes this even more difficult to control. She is worried that her antibiotics may not have been effective because of this constant vomiting. She has been doing her best to pre-medicate with reglan, etc.         15 minutes spent in discussion with the patient without full exam due to time constraints.   Janene Madeira,  MSN, NP-C [facility] for Infectious [place] Pager: [phone] Office: [phone]  01/26/19  7:50 PM

## 2019-01-27 DIAGNOSIS — D509 Iron deficiency anemia, unspecified: Secondary | ICD-10-CM | POA: Diagnosis not present

## 2019-01-27 DIAGNOSIS — Z4932 Encounter for adequacy testing for peritoneal dialysis: Secondary | ICD-10-CM | POA: Diagnosis not present

## 2019-01-27 DIAGNOSIS — Z992 Dependence on renal dialysis: Secondary | ICD-10-CM | POA: Diagnosis not present

## 2019-01-27 DIAGNOSIS — E440 Moderate protein-calorie malnutrition: Secondary | ICD-10-CM | POA: Diagnosis not present

## 2019-01-27 DIAGNOSIS — N186 End stage renal disease: Secondary | ICD-10-CM | POA: Diagnosis not present

## 2019-01-27 DIAGNOSIS — N2581 Secondary hyperparathyroidism of renal origin: Secondary | ICD-10-CM | POA: Diagnosis not present

## 2019-01-28 DIAGNOSIS — D509 Iron deficiency anemia, unspecified: Secondary | ICD-10-CM | POA: Diagnosis not present

## 2019-01-28 DIAGNOSIS — E440 Moderate protein-calorie malnutrition: Secondary | ICD-10-CM | POA: Diagnosis not present

## 2019-01-28 DIAGNOSIS — Z992 Dependence on renal dialysis: Secondary | ICD-10-CM | POA: Diagnosis not present

## 2019-01-28 DIAGNOSIS — N186 End stage renal disease: Secondary | ICD-10-CM | POA: Diagnosis not present

## 2019-01-28 DIAGNOSIS — Z4932 Encounter for adequacy testing for peritoneal dialysis: Secondary | ICD-10-CM | POA: Diagnosis not present

## 2019-01-28 DIAGNOSIS — N2581 Secondary hyperparathyroidism of renal origin: Secondary | ICD-10-CM | POA: Diagnosis not present

## 2019-01-29 DIAGNOSIS — Z4932 Encounter for adequacy testing for peritoneal dialysis: Secondary | ICD-10-CM | POA: Diagnosis not present

## 2019-01-29 DIAGNOSIS — E440 Moderate protein-calorie malnutrition: Secondary | ICD-10-CM | POA: Diagnosis not present

## 2019-01-29 DIAGNOSIS — Z992 Dependence on renal dialysis: Secondary | ICD-10-CM | POA: Diagnosis not present

## 2019-01-29 DIAGNOSIS — N2581 Secondary hyperparathyroidism of renal origin: Secondary | ICD-10-CM | POA: Diagnosis not present

## 2019-01-29 DIAGNOSIS — D509 Iron deficiency anemia, unspecified: Secondary | ICD-10-CM | POA: Diagnosis not present

## 2019-01-29 DIAGNOSIS — N186 End stage renal disease: Secondary | ICD-10-CM | POA: Diagnosis not present

## 2019-01-30 ENCOUNTER — Telehealth: Payer: Self-pay | Admitting: Infectious Diseases

## 2019-01-30 DIAGNOSIS — Z992 Dependence on renal dialysis: Secondary | ICD-10-CM | POA: Diagnosis not present

## 2019-01-30 DIAGNOSIS — N186 End stage renal disease: Secondary | ICD-10-CM | POA: Diagnosis not present

## 2019-01-30 DIAGNOSIS — E440 Moderate protein-calorie malnutrition: Secondary | ICD-10-CM | POA: Diagnosis not present

## 2019-01-30 DIAGNOSIS — D509 Iron deficiency anemia, unspecified: Secondary | ICD-10-CM | POA: Diagnosis not present

## 2019-01-30 DIAGNOSIS — Z4932 Encounter for adequacy testing for peritoneal dialysis: Secondary | ICD-10-CM | POA: Diagnosis not present

## 2019-01-30 DIAGNOSIS — N2581 Secondary hyperparathyroidism of renal origin: Secondary | ICD-10-CM | POA: Diagnosis not present

## 2019-01-30 NOTE — Telephone Encounter (Signed)
I called Susan Horton to get an update regarding her surgeon follow-up late this past Monday evening.  She reports that the wound is healing up nicely and that there was no concern on the surgeon's part for any further infection.  She continues to have no swelling, erythema, warmth, fever, chills, malodorous drainage which I reassured her all good signs that the infection has been adequately treated at this time.  Discussed that she will need to continue with good wound treatment to prevent reinfection or delayed healing.  She has follow-up with her surgeon in 6 weeks. I will leave her appointment with our team as needed for now.  Discussed precautions to call back to be set up for a dalbavancin dose outpatient.  She is still working to get in with a specialist regarding her severe gastroparesis and if we can avoid oral medications this would be ideal to deal with any further cellulitis.

## 2019-01-30 NOTE — Telephone Encounter (Signed)
-----   Message from Charlton Callas, NP sent at 01/29/2019  5:05 PM EST ----- Regarding: call re: surgeon opinion on wound

## 2019-01-31 DIAGNOSIS — D509 Iron deficiency anemia, unspecified: Secondary | ICD-10-CM | POA: Diagnosis not present

## 2019-01-31 DIAGNOSIS — E440 Moderate protein-calorie malnutrition: Secondary | ICD-10-CM | POA: Diagnosis not present

## 2019-01-31 DIAGNOSIS — Z4932 Encounter for adequacy testing for peritoneal dialysis: Secondary | ICD-10-CM | POA: Diagnosis not present

## 2019-01-31 DIAGNOSIS — N2581 Secondary hyperparathyroidism of renal origin: Secondary | ICD-10-CM | POA: Diagnosis not present

## 2019-01-31 DIAGNOSIS — N186 End stage renal disease: Secondary | ICD-10-CM | POA: Diagnosis not present

## 2019-01-31 DIAGNOSIS — Z992 Dependence on renal dialysis: Secondary | ICD-10-CM | POA: Diagnosis not present

## 2019-02-01 DIAGNOSIS — Z4932 Encounter for adequacy testing for peritoneal dialysis: Secondary | ICD-10-CM | POA: Diagnosis not present

## 2019-02-01 DIAGNOSIS — E440 Moderate protein-calorie malnutrition: Secondary | ICD-10-CM | POA: Diagnosis not present

## 2019-02-01 DIAGNOSIS — N2581 Secondary hyperparathyroidism of renal origin: Secondary | ICD-10-CM | POA: Diagnosis not present

## 2019-02-01 DIAGNOSIS — Z992 Dependence on renal dialysis: Secondary | ICD-10-CM | POA: Diagnosis not present

## 2019-02-01 DIAGNOSIS — N186 End stage renal disease: Secondary | ICD-10-CM | POA: Diagnosis not present

## 2019-02-01 DIAGNOSIS — D509 Iron deficiency anemia, unspecified: Secondary | ICD-10-CM | POA: Diagnosis not present

## 2019-02-02 DIAGNOSIS — I25118 Atherosclerotic heart disease of native coronary artery with other forms of angina pectoris: Secondary | ICD-10-CM | POA: Diagnosis not present

## 2019-02-02 DIAGNOSIS — L0231 Cutaneous abscess of buttock: Secondary | ICD-10-CM | POA: Diagnosis not present

## 2019-02-02 DIAGNOSIS — D509 Iron deficiency anemia, unspecified: Secondary | ICD-10-CM | POA: Diagnosis not present

## 2019-02-02 DIAGNOSIS — E440 Moderate protein-calorie malnutrition: Secondary | ICD-10-CM | POA: Diagnosis not present

## 2019-02-02 DIAGNOSIS — Z4932 Encounter for adequacy testing for peritoneal dialysis: Secondary | ICD-10-CM | POA: Diagnosis not present

## 2019-02-02 DIAGNOSIS — I132 Hypertensive heart and chronic kidney disease with heart failure and with stage 5 chronic kidney disease, or end stage renal disease: Secondary | ICD-10-CM | POA: Diagnosis not present

## 2019-02-02 DIAGNOSIS — Z992 Dependence on renal dialysis: Secondary | ICD-10-CM | POA: Diagnosis not present

## 2019-02-02 DIAGNOSIS — N2581 Secondary hyperparathyroidism of renal origin: Secondary | ICD-10-CM | POA: Diagnosis not present

## 2019-02-02 DIAGNOSIS — I5032 Chronic diastolic (congestive) heart failure: Secondary | ICD-10-CM | POA: Diagnosis not present

## 2019-02-02 DIAGNOSIS — E1122 Type 2 diabetes mellitus with diabetic chronic kidney disease: Secondary | ICD-10-CM | POA: Diagnosis not present

## 2019-02-02 DIAGNOSIS — I0981 Rheumatic heart failure: Secondary | ICD-10-CM | POA: Diagnosis not present

## 2019-02-02 DIAGNOSIS — N186 End stage renal disease: Secondary | ICD-10-CM | POA: Diagnosis not present

## 2019-02-03 DIAGNOSIS — D509 Iron deficiency anemia, unspecified: Secondary | ICD-10-CM | POA: Diagnosis not present

## 2019-02-03 DIAGNOSIS — N186 End stage renal disease: Secondary | ICD-10-CM | POA: Diagnosis not present

## 2019-02-03 DIAGNOSIS — Z992 Dependence on renal dialysis: Secondary | ICD-10-CM | POA: Diagnosis not present

## 2019-02-03 DIAGNOSIS — N2581 Secondary hyperparathyroidism of renal origin: Secondary | ICD-10-CM | POA: Diagnosis not present

## 2019-02-03 DIAGNOSIS — Z4932 Encounter for adequacy testing for peritoneal dialysis: Secondary | ICD-10-CM | POA: Diagnosis not present

## 2019-02-03 DIAGNOSIS — E440 Moderate protein-calorie malnutrition: Secondary | ICD-10-CM | POA: Diagnosis not present

## 2019-02-04 DIAGNOSIS — N186 End stage renal disease: Secondary | ICD-10-CM | POA: Diagnosis not present

## 2019-02-04 DIAGNOSIS — Z992 Dependence on renal dialysis: Secondary | ICD-10-CM | POA: Diagnosis not present

## 2019-02-04 DIAGNOSIS — E440 Moderate protein-calorie malnutrition: Secondary | ICD-10-CM | POA: Diagnosis not present

## 2019-02-04 DIAGNOSIS — D509 Iron deficiency anemia, unspecified: Secondary | ICD-10-CM | POA: Diagnosis not present

## 2019-02-04 DIAGNOSIS — Z4932 Encounter for adequacy testing for peritoneal dialysis: Secondary | ICD-10-CM | POA: Diagnosis not present

## 2019-02-04 DIAGNOSIS — N2581 Secondary hyperparathyroidism of renal origin: Secondary | ICD-10-CM | POA: Diagnosis not present

## 2019-02-05 ENCOUNTER — Other Ambulatory Visit: Payer: Self-pay

## 2019-02-05 ENCOUNTER — Encounter: Payer: Self-pay | Admitting: Neurology

## 2019-02-05 ENCOUNTER — Ambulatory Visit (INDEPENDENT_AMBULATORY_CARE_PROVIDER_SITE_OTHER): Payer: Medicare Other | Admitting: Neurology

## 2019-02-05 VITALS — BP 101/66 | HR 76 | Ht 65.0 in | Wt 147.5 lb

## 2019-02-05 DIAGNOSIS — Z9861 Coronary angioplasty status: Secondary | ICD-10-CM

## 2019-02-05 DIAGNOSIS — I2510 Atherosclerotic heart disease of native coronary artery without angina pectoris: Secondary | ICD-10-CM | POA: Diagnosis not present

## 2019-02-05 DIAGNOSIS — E440 Moderate protein-calorie malnutrition: Secondary | ICD-10-CM | POA: Diagnosis not present

## 2019-02-05 DIAGNOSIS — D509 Iron deficiency anemia, unspecified: Secondary | ICD-10-CM | POA: Diagnosis not present

## 2019-02-05 DIAGNOSIS — E1342 Other specified diabetes mellitus with diabetic polyneuropathy: Secondary | ICD-10-CM | POA: Diagnosis not present

## 2019-02-05 DIAGNOSIS — M25571 Pain in right ankle and joints of right foot: Secondary | ICD-10-CM

## 2019-02-05 DIAGNOSIS — N2581 Secondary hyperparathyroidism of renal origin: Secondary | ICD-10-CM | POA: Diagnosis not present

## 2019-02-05 DIAGNOSIS — R29898 Other symptoms and signs involving the musculoskeletal system: Secondary | ICD-10-CM | POA: Diagnosis not present

## 2019-02-05 DIAGNOSIS — Z992 Dependence on renal dialysis: Secondary | ICD-10-CM | POA: Diagnosis not present

## 2019-02-05 DIAGNOSIS — N186 End stage renal disease: Secondary | ICD-10-CM | POA: Diagnosis not present

## 2019-02-05 DIAGNOSIS — Z4932 Encounter for adequacy testing for peritoneal dialysis: Secondary | ICD-10-CM | POA: Diagnosis not present

## 2019-02-05 NOTE — Progress Notes (Signed)
Follow-up Visit   Date: 02/05/19    Susan Horton MRN: [id number] DOB: 10-Jan-1971   Interim History: Susan Horton is a 49 y.o. right-handed African American female with insulin-dependent diabetes mellitus (JJO8C 7.5) complicated by CKD, neuropathy, and gastroparesis, hypertension, CAD s/p PCI stent to LAD, PVD s/p left SFA angioplasty, and GERD returning to discuss MRI brain results from her recent hospitalization for sacral abscess.  MRI brain was ordered due to bilateral leg weakness, worse in the left leg.  Findings shows white matter changes involving the cerebrum and pons, likely due to small vessel disease. She underwent left SFA angioplasty in December and denies any significant change in her leg pain/weakness.  Today, she is also complaining of one-week history of right ankle pain, worse with weight-bearing.  Pain is sharp and throbbing localized to the ankle.  She has not seen her PCP or podiatry for this. She denies any injury or falls. She walks with a walker.   Medications:  Current Outpatient Medications on File Prior to Visit  Medication Sig Dispense Refill  . albuterol (PROVENTIL HFA;VENTOLIN HFA) 108 (90 BASE) MCG/ACT inhaler Inhale 1-2 puffs into the lungs every 6 (six) hours as needed for wheezing or shortness of breath.     . Alirocumab (PRALUENT) 75 MG/ML SOAJ Inject 75 mg into the skin every 14 (fourteen) days. 2 pen 11  . amitriptyline (ELAVIL) 10 MG tablet Take 1 tablet (10 mg total) by mouth at bedtime. 30 tablet 1  . aspirin 81 MG chewable tablet Chew 1 tablet (81 mg total) by mouth daily. 30 tablet 0  . atorvastatin (LIPITOR) 40 MG tablet Take 1 tablet (40 mg total) by mouth daily. 90 tablet 3  . B Complex-C-Zn-Folic Acid (DIALYVITE 166 WITH ZINC) 0.8 MG TABS Take 1 tablet by mouth daily.    . calcitRIOL (ROCALTROL) 0.5 MCG capsule Take 1.5 mcg by mouth daily.    . calcium acetate (PHOSLO) 667 MG capsule Take 1,334-2,001 mg by mouth See admin instructions. Take  three capsules (2001 mg) by mouth up to three times daily with meals and two capsules (1334 mg) with snacks  10  . clopidogrel (PLAVIX) 75 MG tablet Take 1 tablet (75 mg total) by mouth daily with breakfast. 90 tablet 3  . Ferrous Sulfate (IRON) 325 (65 Fe) MG TABS Take 325 mg by mouth daily. 30 each 3  . fluconazole (DIFLUCAN) 150 MG tablet Take 1 tablet (150 mg total) by mouth daily. 1 tablet 0  . Fluticasone-Salmeterol (ADVAIR) 250-50 MCG/DOSE AEPB Inhale 1 puff into the lungs 2 (two) times daily. (Patient taking differently: Inhale 1 puff into the lungs daily as needed (shortness of breath). ) 60 each 3  . gentamicin cream (GARAMYCIN) 0.1 % Apply 1 application topically See admin instructions. Apply to access site nightly    . Insulin Glargine, 1 Unit Dial, (TOUJEO SOLOSTAR) 300 UNIT/ML SOPN Inject 10 Units into the skin 2 (two) times daily. 12 pen 3  . insulin regular (HUMULIN R) 100 units/mL injection Inject 0.2-0.25 mLs (20-25 Units total) into the skin See admin instructions. (Patient taking differently: Inject 7-15 Units into the skin 3 (three) times daily before meals. Per CBG) 45 mL 3  . isosorbide mononitrate (IMDUR) 30 MG 24 hr tablet Take 0.5 tablets (15 mg total) by mouth daily. 45 tablet 3  . lidocaine-prilocaine (EMLA) cream Apply 1 application topically See admin instructions. Apply small amount to access site (AVF) 30 minutes before hemodialysis, cover with occlusive  dressing (Saran Wrap) if going out to dialysis center  5  . methocarbamol (ROBAXIN) 500 MG tablet Take 1 tablet (500 mg total) by mouth 2 (two) times daily as needed for muscle spasms. 60 tablet 1  . metoCLOPramide (REGLAN) 5 MG tablet TAKE 1 TABLET BY MOUTH 4 TIMES DAILY BEFORE MEALS AND AT BEDTIME. (Patient taking differently: Take 5 mg by mouth 4 (four) times daily -  before meals and at bedtime. ) 90 tablet 0  . metoprolol succinate (TOPROL-XL) 50 MG 24 hr tablet Take 1 tablet (50 mg total) by mouth daily. 90 tablet 3    . montelukast (SINGULAIR) 10 MG tablet Take 1 tablet (10 mg total) by mouth at bedtime. (Patient taking differently: Take 10 mg by mouth at bedtime as needed (allergies). ) 30 tablet 5  . mupirocin cream (BACTROBAN) 2 % Apply 1 application topically 2 (two) times daily as needed. To affected areas and cover with non-stick gauze. (Patient taking differently: Apply 1 application topically 2 (two) times daily as needed (for infection. Cover with non-stick gauze.). ) 15 g 0  . nitroGLYCERIN (NITROSTAT) 0.4 MG SL tablet Place 1 tablet (0.4 mg total) under the tongue every 5 (five) minutes as needed for chest pain. 25 tablet 2  . pantoprazole (PROTONIX) 40 MG tablet TAKE 1 TABLET BY MOUTH DAILY. (Patient taking differently: Take 40 mg by mouth daily. ) 30 tablet 7  . pregabalin (LYRICA) 75 MG capsule Take 1 capsule (75 mg total) by mouth daily. 30 capsule 1  . trimethoprim-polymyxin b (POLYTRIM) ophthalmic solution Place 1 drop into both eyes See admin instructions. Instill one drop into both eyes four times daily for 2 days after each monthly eye injection  12   No current facility-administered medications on file prior to visit.    Allergies:  Allergies  Allergen Reactions  . Repatha [Evolocumab] Itching  . Lisinopril Cough     Vital Signs:  BP 101/66   Pulse 76   Ht 5\' 5"  (1.651 m)   Wt 147 lb 8 oz (66.9 kg)   SpO2 99%   BMI 24.55 kg/m   General Medical Exam:   General:  Depressed-appearing, mild distress due to ankle pain  Ext:  Right ankle has mild edema, she tends to guard it when walking and avoid weight bearing.    Neurological Exam: MENTAL STATUS including orientation to time, place, person, recent and remote memory, attention span and concentration, language, and fund of knowledge is normal.  Speech is not dysarthric.  CRANIAL NERVES:  Face is symmetric  MOTOR:  Motor strength is 5/5 in the upper extremities, 4/5 finger abductors.  Right hip flexion is 5-/5, left hip flexion  4/5, bilateral leg extension and flexion is 5/5, right ankle movement is limited due to pain, left ankle movement is 5/5  MSRs:  Reflexes are 1+/4 in the UE, absent in the LE.  Normal tone.   SENSORY:  Vibration is reduced to 50% at the knees and trace distal to ankles bilaterally.     GAIT:  Mildly-wide-based, assisted with walker, antalgic due to right ankle pain.    DATA:   Lab Results  Component Value Date   HGBA1C 7.5 (H) 12/26/2018   Lab Results  Component Value Date   VITAMINB12 2,728 (H) 01/03/2019   Lab Results  Component Value Date   TSH 2.870 04/12/2018   NCS/EMG 11/23/2016 right side: The electrophysiologic findings are consistent with a chronic, length-dependent sensorimotor polyneuropathy, axon loss and demyelinating in  type, affecting the right arm and leg.    EKG 03/22/2017:  QTc 438  IMPRESSION/PLAN: 1.  Left leg weakness with hip flexion does not follow a myotome and with extension intact, makes femoral neuropathy unlikely.  It is possible that some of her weakness may be stemming from known peripheral vascular disease.  No findings on exam to suggest lumbosacral radiculopathy. She has not started physical therapy yet and I suggested that if she does not improve following adequate trial of PT, we can reassess and see if additional testing is warranted.  I have personally viewed her MRI brain and white matter changes are most consistent with small vessel disease which she has many risk factors for, and not multiple sclerosis.  Findings do not explain her symptoms.   2.  Painful neuropathy contributed by diabetes and ESRD, followed by pain management  3.  Right ankle pain and swelling, I have asked her to follow-up with PCP or podiatry.     Thank you for allowing me to participate in patient's care.  If I can answer any additional questions, I would be pleased to do so.    Sincerely,    Marilene Vath K. Posey Pronto, DO

## 2019-02-06 DIAGNOSIS — N2581 Secondary hyperparathyroidism of renal origin: Secondary | ICD-10-CM | POA: Diagnosis not present

## 2019-02-06 DIAGNOSIS — N186 End stage renal disease: Secondary | ICD-10-CM | POA: Diagnosis not present

## 2019-02-06 DIAGNOSIS — Z992 Dependence on renal dialysis: Secondary | ICD-10-CM | POA: Diagnosis not present

## 2019-02-06 DIAGNOSIS — E440 Moderate protein-calorie malnutrition: Secondary | ICD-10-CM | POA: Diagnosis not present

## 2019-02-06 DIAGNOSIS — Z4932 Encounter for adequacy testing for peritoneal dialysis: Secondary | ICD-10-CM | POA: Diagnosis not present

## 2019-02-06 DIAGNOSIS — D509 Iron deficiency anemia, unspecified: Secondary | ICD-10-CM | POA: Diagnosis not present

## 2019-02-07 DIAGNOSIS — N186 End stage renal disease: Secondary | ICD-10-CM | POA: Diagnosis not present

## 2019-02-07 DIAGNOSIS — Z4932 Encounter for adequacy testing for peritoneal dialysis: Secondary | ICD-10-CM | POA: Diagnosis not present

## 2019-02-07 DIAGNOSIS — E440 Moderate protein-calorie malnutrition: Secondary | ICD-10-CM | POA: Diagnosis not present

## 2019-02-07 DIAGNOSIS — Z992 Dependence on renal dialysis: Secondary | ICD-10-CM | POA: Diagnosis not present

## 2019-02-07 DIAGNOSIS — D509 Iron deficiency anemia, unspecified: Secondary | ICD-10-CM | POA: Diagnosis not present

## 2019-02-07 DIAGNOSIS — N2581 Secondary hyperparathyroidism of renal origin: Secondary | ICD-10-CM | POA: Diagnosis not present

## 2019-02-08 DIAGNOSIS — Z992 Dependence on renal dialysis: Secondary | ICD-10-CM | POA: Diagnosis not present

## 2019-02-08 DIAGNOSIS — E440 Moderate protein-calorie malnutrition: Secondary | ICD-10-CM | POA: Diagnosis not present

## 2019-02-08 DIAGNOSIS — D509 Iron deficiency anemia, unspecified: Secondary | ICD-10-CM | POA: Diagnosis not present

## 2019-02-08 DIAGNOSIS — N2581 Secondary hyperparathyroidism of renal origin: Secondary | ICD-10-CM | POA: Diagnosis not present

## 2019-02-08 DIAGNOSIS — N186 End stage renal disease: Secondary | ICD-10-CM | POA: Diagnosis not present

## 2019-02-08 DIAGNOSIS — Z4932 Encounter for adequacy testing for peritoneal dialysis: Secondary | ICD-10-CM | POA: Diagnosis not present

## 2019-02-09 DIAGNOSIS — E440 Moderate protein-calorie malnutrition: Secondary | ICD-10-CM | POA: Diagnosis not present

## 2019-02-09 DIAGNOSIS — N2581 Secondary hyperparathyroidism of renal origin: Secondary | ICD-10-CM | POA: Diagnosis not present

## 2019-02-09 DIAGNOSIS — D509 Iron deficiency anemia, unspecified: Secondary | ICD-10-CM | POA: Diagnosis not present

## 2019-02-09 DIAGNOSIS — Z992 Dependence on renal dialysis: Secondary | ICD-10-CM | POA: Diagnosis not present

## 2019-02-09 DIAGNOSIS — Z4932 Encounter for adequacy testing for peritoneal dialysis: Secondary | ICD-10-CM | POA: Diagnosis not present

## 2019-02-09 DIAGNOSIS — Z20822 Contact with and (suspected) exposure to covid-19: Secondary | ICD-10-CM | POA: Diagnosis not present

## 2019-02-09 DIAGNOSIS — N186 End stage renal disease: Secondary | ICD-10-CM | POA: Diagnosis not present

## 2019-02-09 DIAGNOSIS — Z20828 Contact with and (suspected) exposure to other viral communicable diseases: Secondary | ICD-10-CM | POA: Diagnosis not present

## 2019-02-10 DIAGNOSIS — Z4932 Encounter for adequacy testing for peritoneal dialysis: Secondary | ICD-10-CM | POA: Diagnosis not present

## 2019-02-10 DIAGNOSIS — D509 Iron deficiency anemia, unspecified: Secondary | ICD-10-CM | POA: Diagnosis not present

## 2019-02-10 DIAGNOSIS — N2581 Secondary hyperparathyroidism of renal origin: Secondary | ICD-10-CM | POA: Diagnosis not present

## 2019-02-10 DIAGNOSIS — E440 Moderate protein-calorie malnutrition: Secondary | ICD-10-CM | POA: Diagnosis not present

## 2019-02-10 DIAGNOSIS — Z992 Dependence on renal dialysis: Secondary | ICD-10-CM | POA: Diagnosis not present

## 2019-02-10 DIAGNOSIS — N186 End stage renal disease: Secondary | ICD-10-CM | POA: Diagnosis not present

## 2019-02-11 DIAGNOSIS — D509 Iron deficiency anemia, unspecified: Secondary | ICD-10-CM | POA: Diagnosis not present

## 2019-02-11 DIAGNOSIS — N2581 Secondary hyperparathyroidism of renal origin: Secondary | ICD-10-CM | POA: Diagnosis not present

## 2019-02-11 DIAGNOSIS — N186 End stage renal disease: Secondary | ICD-10-CM | POA: Diagnosis not present

## 2019-02-11 DIAGNOSIS — Z4932 Encounter for adequacy testing for peritoneal dialysis: Secondary | ICD-10-CM | POA: Diagnosis not present

## 2019-02-11 DIAGNOSIS — Z992 Dependence on renal dialysis: Secondary | ICD-10-CM | POA: Diagnosis not present

## 2019-02-11 DIAGNOSIS — E440 Moderate protein-calorie malnutrition: Secondary | ICD-10-CM | POA: Diagnosis not present

## 2019-02-12 DIAGNOSIS — Z992 Dependence on renal dialysis: Secondary | ICD-10-CM | POA: Diagnosis not present

## 2019-02-12 DIAGNOSIS — E440 Moderate protein-calorie malnutrition: Secondary | ICD-10-CM | POA: Diagnosis not present

## 2019-02-12 DIAGNOSIS — D509 Iron deficiency anemia, unspecified: Secondary | ICD-10-CM | POA: Diagnosis not present

## 2019-02-12 DIAGNOSIS — K769 Liver disease, unspecified: Secondary | ICD-10-CM | POA: Diagnosis not present

## 2019-02-12 DIAGNOSIS — N2581 Secondary hyperparathyroidism of renal origin: Secondary | ICD-10-CM | POA: Diagnosis not present

## 2019-02-12 DIAGNOSIS — D631 Anemia in chronic kidney disease: Secondary | ICD-10-CM | POA: Diagnosis not present

## 2019-02-12 DIAGNOSIS — N186 End stage renal disease: Secondary | ICD-10-CM | POA: Diagnosis not present

## 2019-02-13 DIAGNOSIS — N2581 Secondary hyperparathyroidism of renal origin: Secondary | ICD-10-CM | POA: Diagnosis not present

## 2019-02-13 DIAGNOSIS — N186 End stage renal disease: Secondary | ICD-10-CM | POA: Diagnosis not present

## 2019-02-13 DIAGNOSIS — D509 Iron deficiency anemia, unspecified: Secondary | ICD-10-CM | POA: Diagnosis not present

## 2019-02-13 DIAGNOSIS — Z992 Dependence on renal dialysis: Secondary | ICD-10-CM | POA: Diagnosis not present

## 2019-02-13 DIAGNOSIS — K769 Liver disease, unspecified: Secondary | ICD-10-CM | POA: Diagnosis not present

## 2019-02-14 DIAGNOSIS — D509 Iron deficiency anemia, unspecified: Secondary | ICD-10-CM | POA: Diagnosis not present

## 2019-02-14 DIAGNOSIS — N2581 Secondary hyperparathyroidism of renal origin: Secondary | ICD-10-CM | POA: Diagnosis not present

## 2019-02-14 DIAGNOSIS — N186 End stage renal disease: Secondary | ICD-10-CM | POA: Diagnosis not present

## 2019-02-14 DIAGNOSIS — Z992 Dependence on renal dialysis: Secondary | ICD-10-CM | POA: Diagnosis not present

## 2019-02-14 DIAGNOSIS — K769 Liver disease, unspecified: Secondary | ICD-10-CM | POA: Diagnosis not present

## 2019-02-15 DIAGNOSIS — N186 End stage renal disease: Secondary | ICD-10-CM | POA: Diagnosis not present

## 2019-02-15 DIAGNOSIS — R1110 Vomiting, unspecified: Secondary | ICD-10-CM | POA: Diagnosis not present

## 2019-02-15 DIAGNOSIS — D509 Iron deficiency anemia, unspecified: Secondary | ICD-10-CM | POA: Diagnosis not present

## 2019-02-15 DIAGNOSIS — K3184 Gastroparesis: Secondary | ICD-10-CM | POA: Diagnosis not present

## 2019-02-15 DIAGNOSIS — N2581 Secondary hyperparathyroidism of renal origin: Secondary | ICD-10-CM | POA: Diagnosis not present

## 2019-02-15 DIAGNOSIS — E1122 Type 2 diabetes mellitus with diabetic chronic kidney disease: Secondary | ICD-10-CM | POA: Diagnosis not present

## 2019-02-15 DIAGNOSIS — R110 Nausea: Secondary | ICD-10-CM | POA: Diagnosis not present

## 2019-02-15 DIAGNOSIS — Z992 Dependence on renal dialysis: Secondary | ICD-10-CM | POA: Diagnosis not present

## 2019-02-15 DIAGNOSIS — K769 Liver disease, unspecified: Secondary | ICD-10-CM | POA: Diagnosis not present

## 2019-02-15 DIAGNOSIS — R112 Nausea with vomiting, unspecified: Secondary | ICD-10-CM | POA: Diagnosis not present

## 2019-02-16 DIAGNOSIS — K769 Liver disease, unspecified: Secondary | ICD-10-CM | POA: Diagnosis not present

## 2019-02-16 DIAGNOSIS — N2581 Secondary hyperparathyroidism of renal origin: Secondary | ICD-10-CM | POA: Diagnosis not present

## 2019-02-16 DIAGNOSIS — D509 Iron deficiency anemia, unspecified: Secondary | ICD-10-CM | POA: Diagnosis not present

## 2019-02-16 DIAGNOSIS — N186 End stage renal disease: Secondary | ICD-10-CM | POA: Diagnosis not present

## 2019-02-16 DIAGNOSIS — Z992 Dependence on renal dialysis: Secondary | ICD-10-CM | POA: Diagnosis not present

## 2019-02-17 DIAGNOSIS — Z992 Dependence on renal dialysis: Secondary | ICD-10-CM | POA: Diagnosis not present

## 2019-02-17 DIAGNOSIS — I679 Cerebrovascular disease, unspecified: Secondary | ICD-10-CM | POA: Diagnosis not present

## 2019-02-17 DIAGNOSIS — K769 Liver disease, unspecified: Secondary | ICD-10-CM | POA: Diagnosis not present

## 2019-02-17 DIAGNOSIS — D509 Iron deficiency anemia, unspecified: Secondary | ICD-10-CM | POA: Diagnosis not present

## 2019-02-17 DIAGNOSIS — N186 End stage renal disease: Secondary | ICD-10-CM | POA: Diagnosis not present

## 2019-02-17 DIAGNOSIS — N2581 Secondary hyperparathyroidism of renal origin: Secondary | ICD-10-CM | POA: Diagnosis not present

## 2019-02-18 DIAGNOSIS — N186 End stage renal disease: Secondary | ICD-10-CM | POA: Diagnosis not present

## 2019-02-18 DIAGNOSIS — N2581 Secondary hyperparathyroidism of renal origin: Secondary | ICD-10-CM | POA: Diagnosis not present

## 2019-02-18 DIAGNOSIS — K769 Liver disease, unspecified: Secondary | ICD-10-CM | POA: Diagnosis not present

## 2019-02-18 DIAGNOSIS — D509 Iron deficiency anemia, unspecified: Secondary | ICD-10-CM | POA: Diagnosis not present

## 2019-02-18 DIAGNOSIS — Z992 Dependence on renal dialysis: Secondary | ICD-10-CM | POA: Diagnosis not present

## 2019-02-19 DIAGNOSIS — K769 Liver disease, unspecified: Secondary | ICD-10-CM | POA: Diagnosis not present

## 2019-02-19 DIAGNOSIS — Z992 Dependence on renal dialysis: Secondary | ICD-10-CM | POA: Diagnosis not present

## 2019-02-19 DIAGNOSIS — N186 End stage renal disease: Secondary | ICD-10-CM | POA: Diagnosis not present

## 2019-02-19 DIAGNOSIS — D509 Iron deficiency anemia, unspecified: Secondary | ICD-10-CM | POA: Diagnosis not present

## 2019-02-19 DIAGNOSIS — N2581 Secondary hyperparathyroidism of renal origin: Secondary | ICD-10-CM | POA: Diagnosis not present

## 2019-02-20 DIAGNOSIS — Z992 Dependence on renal dialysis: Secondary | ICD-10-CM | POA: Diagnosis not present

## 2019-02-20 DIAGNOSIS — N186 End stage renal disease: Secondary | ICD-10-CM | POA: Diagnosis not present

## 2019-02-20 DIAGNOSIS — N2581 Secondary hyperparathyroidism of renal origin: Secondary | ICD-10-CM | POA: Diagnosis not present

## 2019-02-20 DIAGNOSIS — D509 Iron deficiency anemia, unspecified: Secondary | ICD-10-CM | POA: Diagnosis not present

## 2019-02-20 DIAGNOSIS — K769 Liver disease, unspecified: Secondary | ICD-10-CM | POA: Diagnosis not present

## 2019-02-21 DIAGNOSIS — N186 End stage renal disease: Secondary | ICD-10-CM | POA: Diagnosis not present

## 2019-02-21 DIAGNOSIS — N2581 Secondary hyperparathyroidism of renal origin: Secondary | ICD-10-CM | POA: Diagnosis not present

## 2019-02-21 DIAGNOSIS — K769 Liver disease, unspecified: Secondary | ICD-10-CM | POA: Diagnosis not present

## 2019-02-21 DIAGNOSIS — D509 Iron deficiency anemia, unspecified: Secondary | ICD-10-CM | POA: Diagnosis not present

## 2019-02-21 DIAGNOSIS — Z992 Dependence on renal dialysis: Secondary | ICD-10-CM | POA: Diagnosis not present

## 2019-02-22 DIAGNOSIS — D509 Iron deficiency anemia, unspecified: Secondary | ICD-10-CM | POA: Diagnosis not present

## 2019-02-22 DIAGNOSIS — Z992 Dependence on renal dialysis: Secondary | ICD-10-CM | POA: Diagnosis not present

## 2019-02-22 DIAGNOSIS — N186 End stage renal disease: Secondary | ICD-10-CM | POA: Diagnosis not present

## 2019-02-22 DIAGNOSIS — N2581 Secondary hyperparathyroidism of renal origin: Secondary | ICD-10-CM | POA: Diagnosis not present

## 2019-02-22 DIAGNOSIS — K769 Liver disease, unspecified: Secondary | ICD-10-CM | POA: Diagnosis not present

## 2019-02-23 DIAGNOSIS — D509 Iron deficiency anemia, unspecified: Secondary | ICD-10-CM | POA: Diagnosis not present

## 2019-02-23 DIAGNOSIS — N186 End stage renal disease: Secondary | ICD-10-CM | POA: Diagnosis not present

## 2019-02-23 DIAGNOSIS — K769 Liver disease, unspecified: Secondary | ICD-10-CM | POA: Diagnosis not present

## 2019-02-23 DIAGNOSIS — Z992 Dependence on renal dialysis: Secondary | ICD-10-CM | POA: Diagnosis not present

## 2019-02-23 DIAGNOSIS — N2581 Secondary hyperparathyroidism of renal origin: Secondary | ICD-10-CM | POA: Diagnosis not present

## 2019-02-24 ENCOUNTER — Other Ambulatory Visit: Payer: Self-pay

## 2019-02-24 ENCOUNTER — Emergency Department (HOSPITAL_COMMUNITY): Payer: Medicare Other

## 2019-02-24 ENCOUNTER — Encounter (HOSPITAL_COMMUNITY): Payer: Self-pay | Admitting: Emergency Medicine

## 2019-02-24 ENCOUNTER — Inpatient Hospital Stay (HOSPITAL_COMMUNITY)
Admission: EM | Admit: 2019-02-24 | Discharge: 2019-03-02 | DRG: 177 | Disposition: A | Discharge status: Home / Self Care | Payer: Medicare Other | Attending: Internal Medicine | Admitting: Internal Medicine

## 2019-02-24 DIAGNOSIS — K3184 Gastroparesis: Secondary | ICD-10-CM | POA: Diagnosis present

## 2019-02-24 DIAGNOSIS — I10 Essential (primary) hypertension: Secondary | ICD-10-CM | POA: Diagnosis not present

## 2019-02-24 DIAGNOSIS — T8189XA Other complications of procedures, not elsewhere classified, initial encounter: Secondary | ICD-10-CM | POA: Diagnosis present

## 2019-02-24 DIAGNOSIS — Z7902 Long term (current) use of antithrombotics/antiplatelets: Secondary | ICD-10-CM | POA: Diagnosis not present

## 2019-02-24 DIAGNOSIS — E1122 Type 2 diabetes mellitus with diabetic chronic kidney disease: Secondary | ICD-10-CM | POA: Diagnosis present

## 2019-02-24 DIAGNOSIS — E1142 Type 2 diabetes mellitus with diabetic polyneuropathy: Secondary | ICD-10-CM

## 2019-02-24 DIAGNOSIS — I2510 Atherosclerotic heart disease of native coronary artery without angina pectoris: Secondary | ICD-10-CM | POA: Diagnosis not present

## 2019-02-24 DIAGNOSIS — Z87891 Personal history of nicotine dependence: Secondary | ICD-10-CM | POA: Diagnosis not present

## 2019-02-24 DIAGNOSIS — X58XXXA Exposure to other specified factors, initial encounter: Secondary | ICD-10-CM | POA: Diagnosis present

## 2019-02-24 DIAGNOSIS — Z992 Dependence on renal dialysis: Secondary | ICD-10-CM | POA: Diagnosis not present

## 2019-02-24 DIAGNOSIS — R112 Nausea with vomiting, unspecified: Secondary | ICD-10-CM | POA: Diagnosis present

## 2019-02-24 DIAGNOSIS — E875 Hyperkalemia: Secondary | ICD-10-CM | POA: Diagnosis present

## 2019-02-24 DIAGNOSIS — R1013 Epigastric pain: Secondary | ICD-10-CM

## 2019-02-24 DIAGNOSIS — D509 Iron deficiency anemia, unspecified: Secondary | ICD-10-CM | POA: Diagnosis not present

## 2019-02-24 DIAGNOSIS — R000 Tachycardia, unspecified: Secondary | ICD-10-CM | POA: Diagnosis not present

## 2019-02-24 DIAGNOSIS — Z888 Allergy status to other drugs, medicaments and biological substances status: Secondary | ICD-10-CM

## 2019-02-24 DIAGNOSIS — J1282 Pneumonia due to coronavirus disease 2019: Secondary | ICD-10-CM | POA: Diagnosis not present

## 2019-02-24 DIAGNOSIS — R0602 Shortness of breath: Secondary | ICD-10-CM | POA: Diagnosis not present

## 2019-02-24 DIAGNOSIS — N186 End stage renal disease: Secondary | ICD-10-CM | POA: Diagnosis not present

## 2019-02-24 DIAGNOSIS — I120 Hypertensive chronic kidney disease with stage 5 chronic kidney disease or end stage renal disease: Secondary | ICD-10-CM | POA: Diagnosis not present

## 2019-02-24 DIAGNOSIS — IMO0002 Reserved for concepts with insufficient information to code with codable children: Secondary | ICD-10-CM

## 2019-02-24 DIAGNOSIS — E1143 Type 2 diabetes mellitus with diabetic autonomic (poly)neuropathy: Secondary | ICD-10-CM | POA: Diagnosis present

## 2019-02-24 DIAGNOSIS — I132 Hypertensive heart and chronic kidney disease with heart failure and with stage 5 chronic kidney disease, or end stage renal disease: Secondary | ICD-10-CM | POA: Diagnosis present

## 2019-02-24 DIAGNOSIS — A084 Viral intestinal infection, unspecified: Secondary | ICD-10-CM | POA: Diagnosis present

## 2019-02-24 DIAGNOSIS — N2581 Secondary hyperparathyroidism of renal origin: Secondary | ICD-10-CM | POA: Diagnosis not present

## 2019-02-24 DIAGNOSIS — U071 COVID-19: Principal | ICD-10-CM | POA: Diagnosis present

## 2019-02-24 DIAGNOSIS — I5032 Chronic diastolic (congestive) heart failure: Secondary | ICD-10-CM | POA: Diagnosis present

## 2019-02-24 DIAGNOSIS — Z8249 Family history of ischemic heart disease and other diseases of the circulatory system: Secondary | ICD-10-CM | POA: Diagnosis not present

## 2019-02-24 DIAGNOSIS — Z79899 Other long term (current) drug therapy: Secondary | ICD-10-CM

## 2019-02-24 DIAGNOSIS — Z7982 Long term (current) use of aspirin: Secondary | ICD-10-CM | POA: Diagnosis not present

## 2019-02-24 DIAGNOSIS — J45909 Unspecified asthma, uncomplicated: Secondary | ICD-10-CM | POA: Diagnosis present

## 2019-02-24 DIAGNOSIS — Z794 Long term (current) use of insulin: Secondary | ICD-10-CM

## 2019-02-24 DIAGNOSIS — E1129 Type 2 diabetes mellitus with other diabetic kidney complication: Secondary | ICD-10-CM | POA: Diagnosis not present

## 2019-02-24 DIAGNOSIS — K219 Gastro-esophageal reflux disease without esophagitis: Secondary | ICD-10-CM | POA: Diagnosis present

## 2019-02-24 DIAGNOSIS — K769 Liver disease, unspecified: Secondary | ICD-10-CM | POA: Diagnosis not present

## 2019-02-24 DIAGNOSIS — Z4901 Encounter for fitting and adjustment of extracorporeal dialysis catheter: Secondary | ICD-10-CM | POA: Diagnosis not present

## 2019-02-24 DIAGNOSIS — Z955 Presence of coronary angioplasty implant and graft: Secondary | ICD-10-CM

## 2019-02-24 DIAGNOSIS — E1165 Type 2 diabetes mellitus with hyperglycemia: Secondary | ICD-10-CM | POA: Diagnosis not present

## 2019-02-24 DIAGNOSIS — E1151 Type 2 diabetes mellitus with diabetic peripheral angiopathy without gangrene: Secondary | ICD-10-CM | POA: Diagnosis present

## 2019-02-24 DIAGNOSIS — S3091XA Unspecified superficial injury of lower back and pelvis, initial encounter: Secondary | ICD-10-CM | POA: Diagnosis present

## 2019-02-24 DIAGNOSIS — J189 Pneumonia, unspecified organism: Secondary | ICD-10-CM

## 2019-02-24 DIAGNOSIS — R1110 Vomiting, unspecified: Secondary | ICD-10-CM | POA: Diagnosis not present

## 2019-02-24 LAB — CBC
HCT: 36.2 % (ref 36.0–46.0)
Hemoglobin: 10.9 g/dL — ABNORMAL LOW (ref 12.0–15.0)
MCH: 26 pg (ref 26.0–34.0)
MCHC: 30.1 g/dL (ref 30.0–36.0)
MCV: 86.4 fL (ref 80.0–100.0)
Platelets: 439 10*3/uL — ABNORMAL HIGH (ref 150–400)
RBC: 4.19 MIL/uL (ref 3.87–5.11)
RDW: 15.1 % (ref 11.5–15.5)
WBC: 7.4 10*3/uL (ref 4.0–10.5)
nRBC: 0 % (ref 0.0–0.2)

## 2019-02-24 LAB — COMPREHENSIVE METABOLIC PANEL
ALT: 16 U/L (ref 0–44)
AST: 16 U/L (ref 15–41)
Albumin: 2.8 g/dL — ABNORMAL LOW (ref 3.5–5.0)
Alkaline Phosphatase: 93 U/L (ref 38–126)
Anion gap: 20 — ABNORMAL HIGH (ref 5–15)
BUN: 78 mg/dL — ABNORMAL HIGH (ref 6–20)
CO2: 20 mmol/L — ABNORMAL LOW (ref 22–32)
Calcium: 8.9 mg/dL (ref 8.9–10.3)
Chloride: 101 mmol/L (ref 98–111)
Creatinine, Ser: 15.56 mg/dL — ABNORMAL HIGH (ref 0.44–1.00)
GFR calc Af Amer: 3 mL/min — ABNORMAL LOW (ref >60)
GFR calc non Af Amer: 2 mL/min — ABNORMAL LOW (ref >60)
Glucose, Bld: 74 mg/dL (ref 70–99)
Potassium: 5.4 mmol/L — ABNORMAL HIGH (ref 3.5–5.1)
Sodium: 141 mmol/L (ref 135–145)
Total Bilirubin: 1.1 mg/dL (ref 0.3–1.2)
Total Protein: 7.6 g/dL (ref 6.5–8.1)

## 2019-02-24 LAB — I-STAT BETA HCG BLOOD, ED (MC, WL, AP ONLY): I-stat hCG, quantitative: <5 m[IU]/mL (ref <5)

## 2019-02-24 LAB — LIPASE, BLOOD: Lipase: 32 U/L (ref 11–51)

## 2019-02-24 MED ORDER — MOMETASONE FURO-FORMOTEROL FUM 200-5 MCG/ACT IN AERO
2.0000 | INHALATION_SPRAY | Freq: Two times a day (BID) | RESPIRATORY_TRACT | Status: DC
  Administered 2019-02-25 – 2019-03-02 (×6): 2 via RESPIRATORY_TRACT
  Filled 2019-02-24: qty 8.8

## 2019-02-24 MED ORDER — SODIUM CHLORIDE 0.9 % IV SOLN: INTRAVENOUS | Status: DC

## 2019-02-24 MED ORDER — HYDRALAZINE HCL 20 MG/ML IJ SOLN
10.0000 mg | INTRAMUSCULAR | Status: DC | PRN
  Administered 2019-02-25: 10 mg via INTRAVENOUS
  Filled 2019-02-24: qty 1

## 2019-02-24 MED ORDER — MORPHINE SULFATE (PF) 4 MG/ML IV SOLN
4.0000 mg | Freq: Once | INTRAVENOUS | Status: AC
  Administered 2019-02-24: 4 mg via INTRAVENOUS
  Filled 2019-02-24: qty 1

## 2019-02-24 MED ORDER — FERROUS SULFATE 325 (65 FE) MG PO TABS
325.0000 mg | ORAL_TABLET | Freq: Every day | ORAL | Status: DC
  Administered 2019-02-25 – 2019-02-28 (×4): 325 mg via ORAL
  Filled 2019-02-24 (×5): qty 1

## 2019-02-24 MED ORDER — INSULIN GLARGINE 100 UNIT/ML ~~LOC~~ SOLN
10.0000 [IU] | Freq: Two times a day (BID) | SUBCUTANEOUS | Status: DC
  Administered 2019-02-25 (×2): 10 [IU] via SUBCUTANEOUS
  Filled 2019-02-24 (×5): qty 0.1

## 2019-02-24 MED ORDER — CLOPIDOGREL BISULFATE 75 MG PO TABS
75.0000 mg | ORAL_TABLET | Freq: Every day | ORAL | Status: DC
  Administered 2019-02-25 – 2019-03-02 (×6): 75 mg via ORAL
  Filled 2019-02-24 (×7): qty 1

## 2019-02-24 MED ORDER — SODIUM CHLORIDE 0.9% FLUSH: 3.0000 mL | Freq: Once | INTRAVENOUS | Status: DC

## 2019-02-24 MED ORDER — ONDANSETRON 4 MG PO TBDP
4.0000 mg | ORAL_TABLET | Freq: Once | ORAL | Status: AC
  Administered 2019-02-24: 4 mg via ORAL
  Filled 2019-02-24: qty 1

## 2019-02-24 MED ORDER — ATORVASTATIN CALCIUM 40 MG PO TABS
40.0000 mg | ORAL_TABLET | Freq: Every day | ORAL | Status: DC
  Administered 2019-02-25 – 2019-03-02 (×6): 40 mg via ORAL
  Filled 2019-02-24 (×7): qty 1

## 2019-02-24 MED ORDER — ALUM & MAG HYDROXIDE-SIMETH 200-200-20 MG/5ML PO SUSP: 30.0000 mL | Freq: Once | ORAL | Status: DC

## 2019-02-24 MED ORDER — MORPHINE SULFATE (PF) 2 MG/ML IV SOLN: 1.0000 mg | INTRAVENOUS | Status: DC | PRN

## 2019-02-24 MED ORDER — METOCLOPRAMIDE HCL 5 MG/ML IJ SOLN
10.0000 mg | Freq: Once | INTRAMUSCULAR | Status: AC
  Administered 2019-02-24: 10 mg via INTRAVENOUS
  Filled 2019-02-24: qty 2

## 2019-02-24 MED ORDER — ONDANSETRON HCL 4 MG/2ML IJ SOLN
4.0000 mg | Freq: Once | INTRAMUSCULAR | Status: DC
  Filled 2019-02-24: qty 2

## 2019-02-24 MED ORDER — PANTOPRAZOLE SODIUM 40 MG IV SOLR
40.0000 mg | Freq: Every day | INTRAVENOUS | Status: DC
  Administered 2019-02-25 – 2019-03-02 (×5): 40 mg via INTRAVENOUS
  Filled 2019-02-24 (×6): qty 40

## 2019-02-24 MED ORDER — INSULIN ASPART 100 UNIT/ML ~~LOC~~ SOLN
0.0000 [IU] | Freq: Three times a day (TID) | SUBCUTANEOUS | Status: DC
  Administered 2019-02-25: 0 [IU] via SUBCUTANEOUS

## 2019-02-24 MED ORDER — CALCIUM ACETATE (PHOS BINDER) 667 MG PO CAPS: 1334.0000 mg | ORAL_CAPSULE | ORAL | Status: DC

## 2019-02-24 MED ORDER — ACETAMINOPHEN 325 MG PO TABS
650.0000 mg | ORAL_TABLET | Freq: Four times a day (QID) | ORAL | Status: DC | PRN
  Administered 2019-02-25 – 2019-03-01 (×2): 650 mg via ORAL
  Filled 2019-02-24 (×3): qty 2

## 2019-02-24 MED ORDER — SODIUM CHLORIDE 0.9 % IV BOLUS
500.0000 mL | Freq: Once | INTRAVENOUS | Status: AC
  Administered 2019-02-24: 500 mL via INTRAVENOUS

## 2019-02-24 MED ORDER — DEXAMETHASONE SODIUM PHOSPHATE 10 MG/ML IJ SOLN
6.0000 mg | INTRAMUSCULAR | Status: DC
  Administered 2019-02-25 – 2019-02-26 (×3): 6 mg via INTRAVENOUS
  Filled 2019-02-24 (×4): qty 1

## 2019-02-24 MED ORDER — ISOSORBIDE MONONITRATE ER 30 MG PO TB24
15.0000 mg | ORAL_TABLET | Freq: Every day | ORAL | Status: DC
  Administered 2019-02-25 – 2019-02-26 (×2): 15 mg via ORAL
  Filled 2019-02-24 (×3): qty 1

## 2019-02-24 MED ORDER — ONDANSETRON HCL 4 MG/2ML IJ SOLN
4.0000 mg | Freq: Four times a day (QID) | INTRAMUSCULAR | Status: DC | PRN
  Administered 2019-02-25 – 2019-03-01 (×6): 4 mg via INTRAVENOUS
  Filled 2019-02-24 (×6): qty 2

## 2019-02-24 MED ORDER — ALBUTEROL SULFATE HFA 108 (90 BASE) MCG/ACT IN AERS
1.0000 | INHALATION_SPRAY | Freq: Four times a day (QID) | RESPIRATORY_TRACT | Status: DC | PRN
  Filled 2019-02-24: qty 6.7

## 2019-02-24 MED ORDER — LIDOCAINE VISCOUS HCL 2 % MT SOLN: 15.0000 mL | Freq: Once | OROMUCOSAL | Status: DC

## 2019-02-24 MED ORDER — ACETAMINOPHEN 650 MG RE SUPP: 650.0000 mg | Freq: Four times a day (QID) | RECTAL | Status: DC | PRN

## 2019-02-24 MED ORDER — CALCITRIOL 0.25 MCG PO CAPS
1.5000 ug | ORAL_CAPSULE | Freq: Every day | ORAL | Status: DC
  Administered 2019-02-25 – 2019-02-28 (×4): 1.5 ug via ORAL
  Filled 2019-02-24 (×3): qty 6

## 2019-02-24 MED ORDER — HEPARIN SODIUM (PORCINE) 5000 UNIT/ML IJ SOLN
5000.0000 [IU] | Freq: Three times a day (TID) | INTRAMUSCULAR | Status: DC
  Administered 2019-02-25 – 2019-03-01 (×11): 5000 [IU] via SUBCUTANEOUS
  Filled 2019-02-24 (×13): qty 1

## 2019-02-24 MED ORDER — METOCLOPRAMIDE HCL 5 MG/ML IJ SOLN
10.0000 mg | Freq: Three times a day (TID) | INTRAMUSCULAR | Status: DC
  Administered 2019-02-25 (×2): 10 mg via INTRAVENOUS
  Filled 2019-02-24 (×2): qty 2

## 2019-02-24 MED ORDER — METOPROLOL SUCCINATE ER 50 MG PO TB24
50.0000 mg | ORAL_TABLET | Freq: Every day | ORAL | Status: DC
  Administered 2019-02-25 – 2019-03-02 (×6): 50 mg via ORAL
  Filled 2019-02-24 (×6): qty 1

## 2019-02-24 MED ORDER — MONTELUKAST SODIUM 10 MG PO TABS
10.0000 mg | ORAL_TABLET | Freq: Every evening | ORAL | Status: DC | PRN
  Filled 2019-02-24 (×2): qty 1

## 2019-02-24 NOTE — ED Triage Notes (Signed)
C/o generalized abd pain x 3-4 days and increased vomiting.  States she normally vomits everyday but it has been worse. Pt had EGD at [facility] on 2/4.

## 2019-02-24 NOTE — ED Provider Notes (Signed)
[facility] EMERGENCY DEPARTMENT Provider Note   CSN: [id number] Arrival date & time: 02/24/19  1609     History Chief Complaint  Patient presents with  . Abdominal Pain  . Emesis    Susan Horton is a 49 y.o. female with a past medical history significant for hypertension, CAD, asthma, ESRD on peritoneal dialysis, diabetes, history of DVT, gastroparesis, and GERD who presents to the ED due to gradual onset of worsening generalized abdominal pain associated with nonbloody, nonbilious emesis.  Patient states abdominal pain has worsened over the past 3 to 4 days.  She notes she typically vomits daily; however, episodes have increased over the past few days.  She admits to 15 episodes of emesis yesterday and today.  She recently had a endoscopy done on 02/15/2019.  Results unavailable on chart review.  Per patient she states that the doctor told her "it needs to be stretched", but she is unsure what exactly needs to be done.  Patient denies sick contacts and Covid exposures.  She sees Dr. Lacinda Axon with [facility] for these symptoms.  She has been taking Reglan and Protonix for her symptoms with no relief.  Patient notes her glucose has been well controlled recently and running in the 120s.  She has been doing manual peritoneal dialysis for the past few days since her new supplies just came in.  Patient denies fever and chills.  Denies diarrhea.  Patient denies hematemesis, melena, and hematochezia.  She has a history of cholecystectomy and appendectomy, but no other abdominal operations.  Patient denies urinary and vaginal symptoms.  She admits to a 20 pound weight loss over the past month which has been unintentional due to her decreased p.o. intake.   Chart reviewed.  Patient has been admitted twice for a sacrum abscess.  Patient notes her sister cleans and packs the wound daily.  Denies drainage from the site.  She was last seen by infectious disease on 01/26/2019; however did not let  the provider check her wound. She was instructed then to continue her 14 day Linezolid. Patient denies any complaints related to her sacrum wound today in the ED. She deferred me checking the wound.  Past Medical History:  Diagnosis Date  . Abnormal stress test   . Anemia   . Angio-edema   . Asthma   . CAD (coronary artery disease)    DES to mid LAD July 2018, residual moderate RCA disease  . Cataract   . CKD (chronic kidney disease) stage 4, GFR 15-29 ml/min (HCC)    Dialysis T/Th/Sa  . DVT (deep venous thrombosis) ([place])    1996 during pregnancy, 2015 left leg  . Eczema   . Essential hypertension 12/11/2015  . Gastroparesis   . GERD (gastroesophageal reflux disease)   . Gluteal abscess 12/27/2018  . Hidradenitis   . Migraine   . Neuropathy   . Peripheral vascular disease ([place])    blood clot in leg  . Progressive angina ([place]) 06/05/2014   Chest pain  . Type 2 diabetes mellitus ([place])   . Type II diabetes mellitus ([place])   . Urticaria     Patient Active Problem List   Diagnosis Date Noted  . Intractable nausea and vomiting 02/24/2019  . Shortness of breath 01/19/2019  . Cerebrovascular small vessel disease   . Generalized weakness   . Abscess of sacrum ([place])   . Gluteal abscess 12/25/2018  . Symptomatic anemia 09/16/2018  . Peripheral vascular disease, unspecified ([place])  09/04/2018  . ESRD on peritoneal dialysis ([place]) 10/11/2017  . Chronic diastolic CHF (congestive heart failure) ([place]) 06/18/2017  . Moderate persistent asthma 06/10/2017  . CKD (chronic kidney disease) stage 4, GFR 15-29 ml/min (HCC)   . CAD S/P mLAD PCI with DES 08/06/2016  . Presence of drug coated stent in LAD coronary artery 08/06/2016  . Carotid bruit present 06/15/2016  . Dyslipidemia 06/15/2016  . Smoker 06/15/2016  . Neuropathy 03/10/2016  . Essential hypertension, benign 12/11/2015  . Gastroparesis   . Uncontrolled type 2 diabetes mellitus with peripheral neuropathy ([place]) 06/05/2014    Past  Surgical History:  Procedure Laterality Date  . A/V FISTULAGRAM N/A 06/22/2017   Procedure: A/V FISTULAGRAM - left arm;  Surgeon: [name], MD;  Location: [facility];  Service: Cardiovascular;  Laterality: N/A;  . ABDOMINAL AORTOGRAM W/LOWER EXTREMITY Bilateral 12/14/2018   Procedure: ABDOMINAL AORTOGRAM W/LOWER EXTREMITY;  Surgeon: [name], MD;  Location: [facility];  Service: Cardiovascular;  Laterality: Bilateral;  . ABDOMINOPLASTY    . ADENOIDECTOMY    . APPENDECTOMY  1995  . AV FISTULA PLACEMENT Left 02/01/2017   Procedure: ARTERIOVENOUS BRACHIOCEPHALIC (AV) FISTULA CREATION;  Surgeon: [name], MD;  Location: [facility];  Service: Vascular;  Laterality: Left;  . CHOLECYSTECTOMY  1993  . CORONARY ANGIOPLASTY WITH STENT PLACEMENT    . CORONARY STENT INTERVENTION N/A 08/05/2016   Procedure: Coronary Stent Intervention;  Surgeon: [name], MD;  Location: [facility];  Service: Cardiovascular;  Laterality: N/A;  . EXPLORATORY LAPAROTOMY  08/14/2005   lysis of adhesions, drainage of tubo-ovarian abscess  . FISTULA SUPERFICIALIZATION Left 09/14/2017   Procedure: FISTULA SUPERFICIALIZATION ARTERIOVENOUS FISTULA LEFT ARM;  Surgeon: [name], MD;  Location: [facility];  Service: Vascular;  Laterality: Left;  . FLEXIBLE SIGMOIDOSCOPY N/A 01/04/2019   Procedure: FLEXIBLE SIGMOIDOSCOPY;  Surgeon: Clarene Essex, MD;  Location: [facility];  Service: Endoscopy;  Laterality: N/A;  . HYDRADENITIS EXCISION  02/08/2011   Procedure: EXCISION HYDRADENITIS GROIN;  Surgeon: [name], MD;  Location: [facility];  Service: General;  Laterality: N/A;  Excisioin of Hidradenitis Left groin  . INCISION AND DRAINAGE ABSCESS N/A 01/11/2019   Procedure: INCISION AND DRAINAGE SACRAL ABSCESS;  Surgeon: Georganna Skeans, MD;  Location: [facility];  Service: General;  Laterality: N/A;  . INGUINAL HIDRADENITIS EXCISION  07/06/2010   bilateral   . INSERTION OF DIALYSIS CATHETER N/A 09/14/2017   Procedure: INSERTION OF TUNNELED DIALYSIS CATHETER Right Internal Jugular;  Surgeon: [name], MD;  Location: [facility];  Service: Vascular;  Laterality: N/A;  . IR US GUIDE BX ASP/DRAIN  01/01/2019  . LEFT HEART CATH AND CORONARY ANGIOGRAPHY N/A 08/05/2016   Procedure: Left Heart Cath and Coronary Angiography;  Surgeon: [name], MD;  Location: [facility];  Service: Cardiovascular;  Laterality: N/A;  . PERIPHERAL VASCULAR INTERVENTION Left 12/14/2018   Procedure: PERIPHERAL VASCULAR INTERVENTION;  Surgeon: [name], MD;  Location: [facility];  Service: Cardiovascular;  Laterality: Left;  SFA  . REDUCTION MAMMAPLASTY  2002  . TONSILLECTOMY    . TUBAL LIGATION  1996  . VAGINAL HYSTERECTOMY  08/04/2005   and cysto     OB History   No obstetric history on file.     Family History  Problem Relation Age of Onset  . Allergic rhinitis Mother   . Hypertension Father   . Allergic rhinitis Father   .  Hypertension Sister   . Allergic rhinitis Sister   . Hypertension Brother   . Allergic rhinitis Brother   . Breast cancer Cousin        x2    Social History   Tobacco Use  . Smoking status: Former Smoker    Years: 20.00    Types: Cigarettes    Quit date: 11/2016    Years since quitting: 2.2  . Smokeless tobacco: Never Used  Substance Use Topics  . Alcohol use: No    Alcohol/week: 0.0 standard drinks  . Drug use: No    Home Medications Prior to Admission medications   Medication Sig Start Date End Date Taking? Authorizing Provider  albuterol (PROVENTIL HFA;VENTOLIN HFA) 108 (90 BASE) MCG/ACT inhaler Inhale 1-2 puffs into the lungs every 6 (six) hours as needed for wheezing or shortness of breath.    Yes [provider]  Alirocumab (PRALUENT) 75 MG/ML SOAJ Inject 75 mg into the skin every 14 (fourteen) days. 01/24/19  Yes [name], MD  amitriptyline (ELAVIL) 10 MG tablet  Take 1 tablet (10 mg total) by mouth at bedtime. 09/19/18  Yes Jamse Arn, MD  aspirin 81 MG chewable tablet Chew 1 tablet (81 mg total) by mouth daily. 08/07/16  Yes Cheryln Manly, NP  atorvastatin (LIPITOR) 40 MG tablet Take 1 tablet (40 mg total) by mouth daily. 01/23/19 04/23/19 Yes [name], MD  B Complex-C-Zn-Folic Acid (DIALYVITE 163 WITH ZINC) 0.8 MG TABS Take 1 tablet by mouth daily. 10/06/18  Yes [provider]  calcitRIOL (ROCALTROL) 0.5 MCG capsule Take 1.5 mcg by mouth daily. 09/11/18  Yes [provider]  calcium acetate (PHOSLO) 667 MG capsule Take 1,334-2,001 mg by mouth See admin instructions. Take three capsules (2001 mg) by mouth up to three times daily with meals and two capsules (1334 mg) with snacks 09/23/17  Yes [provider]  clopidogrel (PLAVIX) 75 MG tablet Take 1 tablet (75 mg total) by mouth daily with breakfast. 10/19/17  Yes Kilroy, Luke K, PA-C  Ferrous Sulfate (IRON) 325 (65 Fe) MG TABS Take 325 mg by mouth daily. 11/25/15  Yes Ena Dawley, Tiffany S, PA-C  fluconazole (DIFLUCAN) 150 MG tablet Take 1 tablet (150 mg total) by mouth daily. 01/14/19  Yes Rory Percy, DO  Fluticasone-Salmeterol (ADVAIR) 250-50 MCG/DOSE AEPB Inhale 1 puff into the lungs 2 (two) times daily. Patient taking differently: Inhale 1 puff into the lungs daily as needed (shortness of breath).  06/10/17  Yes Charlott Rakes, MD  gentamicin cream (GARAMYCIN) 0.1 % Apply 1 application topically See admin instructions. Apply to access site nightly 09/11/18  Yes [provider]  Insulin Glargine, 1 Unit Dial, (TOUJEO SOLOSTAR) 300 UNIT/ML SOPN Inject 10 Units into the skin 2 (two) times daily. 01/05/19  Yes Lattie Haw, MD  insulin regular (HUMULIN R) 100 units/mL injection Inject 0.2-0.25 mLs (20-25 Units total) into the skin See admin instructions. Patient taking differently: Inject 7-15 Units into the skin 3 (three) times daily before meals. Per CBG 09/22/17   Yes Philemon Kingdom, MD  isosorbide mononitrate (IMDUR) 30 MG 24 hr tablet Take 0.5 tablets (15 mg total) by mouth daily. 10/19/17 02/24/19 Yes Kilroy, Luke K, PA-C  lidocaine-prilocaine (EMLA) cream Apply 1 application topically See admin instructions. Apply small amount to access site (AVF) 30 minutes before hemodialysis, cover with occlusive dressing (Saran Wrap) if going out to dialysis center 09/15/17  Yes [provider]  methocarbamol (ROBAXIN) 500 MG tablet Take 1  tablet (500 mg total) by mouth 2 (two) times daily as needed for muscle spasms. 09/07/17  Yes Patel, Domenick Bookbinder, MD  metoCLOPramide (REGLAN) 5 MG tablet TAKE 1 TABLET BY MOUTH 4 TIMES DAILY BEFORE MEALS AND AT BEDTIME. Patient taking differently: Take 5 mg by mouth 4 (four) times daily -  before meals and at bedtime.  03/18/17  Yes [name], MD  metoprolol succinate (TOPROL-XL) 50 MG 24 hr tablet Take 1 tablet (50 mg total) by mouth daily. 03/14/18  Yes [name], MD  montelukast (SINGULAIR) 10 MG tablet Take 1 tablet (10 mg total) by mouth at bedtime. Patient taking differently: Take 10 mg by mouth at bedtime as needed (allergies).  04/12/18  Yes Padgett, Rae Halsted, MD  nitroGLYCERIN (NITROSTAT) 0.4 MG SL tablet Place 1 tablet (0.4 mg total) under the tongue every 5 (five) minutes as needed for chest pain. 10/19/17  Yes Kilroy, Luke K, PA-C  pantoprazole (PROTONIX) 40 MG tablet TAKE 1 TABLET BY MOUTH DAILY. Patient taking differently: Take 40 mg by mouth daily.  02/14/18  Yes [name], MD  pregabalin (LYRICA) 75 MG capsule Take 1 capsule (75 mg total) by mouth daily. 09/19/18  Yes Jamse Arn, MD  trimethoprim-polymyxin b (POLYTRIM) ophthalmic solution Place 1 drop into both eyes See admin instructions. Instill one drop into both eyes four times daily for 2 days after each monthly eye injection 06/16/17  Yes [provider]  mupirocin cream (BACTROBAN) 2 % Apply 1 application topically 2  (two) times daily as needed. To affected areas and cover with non-stick gauze. Patient not taking: Reported on 02/24/2019 11/17/18   Katy Apo, NP    Allergies    Repatha [evolocumab] and Lisinopril  Review of Systems   Review of Systems  Constitutional: Negative for chills and fever.  Respiratory: Negative for shortness of breath.   Cardiovascular: Negative for chest pain.  Gastrointestinal: Positive for abdominal pain, nausea and vomiting. Negative for diarrhea.  Genitourinary: Negative for dysuria.  Musculoskeletal: Negative for back pain.  All other systems reviewed and are negative.   Physical Exam Updated Vital Signs BP (!) 179/88 (BP Location: Right Arm)   Pulse (!) 111   Temp 98 F (36.7 C) (Oral)   Resp 16   SpO2 100%   Physical Exam Vitals and nursing note reviewed.  Constitutional:      General: She is not in acute distress.    Appearance: She is not ill-appearing.  HENT:     Head: Normocephalic.  Eyes:     Pupils: Pupils are equal, round, and reactive to light.  Cardiovascular:     Rate and Rhythm: Regular rhythm. Tachycardia present.     Pulses: Normal pulses.     Heart sounds: Normal heart sounds. No murmur. No friction rub. No gallop.   Pulmonary:     Effort: Pulmonary effort is normal.     Breath sounds: Normal breath sounds.  Abdominal:     General: Abdomen is flat. Bowel sounds are normal. There is no distension.     Palpations: Abdomen is soft.     Tenderness: There is abdominal tenderness. There is no guarding or rebound.     Comments: Generalized tenderness palpation throughout, worse in the epigastric and right upper quadrant region.  No rebound or guarding.  No peritoneal signs.  Negative CVA tenderness bilaterally.  Genitourinary:    Comments: Deferred sacrum wound exam. Musculoskeletal:     Cervical back: Neck supple.  Comments: Able to move all 4 extremities without difficulty.  Skin:    General: Skin is warm and dry.    Neurological:     General: No focal deficit present.     Mental Status: She is alert.  Psychiatric:        Mood and Affect: Mood normal.        Behavior: Behavior normal.     ED Results / Procedures / Treatments   Labs (all labs ordered are listed, but only abnormal results are displayed) Labs Reviewed  COMPREHENSIVE METABOLIC PANEL - Abnormal; Notable for the following components:      Result Value   Potassium 5.4 (*)    CO2 20 (*)    BUN 78 (*)    Creatinine, Ser 15.56 (*)    Albumin 2.8 (*)    GFR calc non Af Amer 2 (*)    GFR calc Af Amer 3 (*)    Anion gap 20 (*)    All other components within normal limits  CBC - Abnormal; Notable for the following components:   Hemoglobin 10.9 (*)    Platelets 439 (*)    All other components within normal limits  BODY FLUID CULTURE  RESPIRATORY PANEL BY RT PCR (FLU A&B, COVID)  LIPASE, BLOOD  URINALYSIS, ROUTINE W REFLEX MICROSCOPIC  LACTATE DEHYDROGENASE, PLEURAL OR PERITONEAL FLUID  GLUCOSE, PLEURAL OR PERITONEAL FLUID  PROTEIN, PLEURAL OR PERITONEAL FLUID  ALBUMIN, PLEURAL OR PERITONEAL FLUID  BODY FLUID CELL COUNT WITH DIFFERENTIAL  I-STAT BETA HCG BLOOD, ED ([facility], [facility], [facility] ONLY)    EKG EKG Interpretation  Date/Time:  Saturday February 24 2019 18:22:44 EST Ventricular Rate:  113 PR Interval:    QRS Duration: 81 QT Interval:  330 QTC Calculation: 453 R Axis:   42 Text Interpretation: Sinus tachycardia Probable left atrial enlargement Nonspecific T abnormalities, lateral leads No significant change was found Confirmed by Ezequiel Essex [phone]) on 02/24/2019 6:28:42 PM   Radiology CT ABDOMEN PELVIS WO CONTRAST  Result Date: 02/24/2019 CLINICAL DATA:  Generalized abdominal pain with vomiting EXAM: CT ABDOMEN AND PELVIS WITHOUT CONTRAST TECHNIQUE: Multidetector CT imaging of the abdomen and pelvis was performed following the standard protocol without IV contrast. COMPARISON:  CT 12/27/2018, 12/25/2018, MRI 01/10/2019  FINDINGS: Lower chest: Lung bases demonstrate scattered foci of ground-glass density and patchy consolidation mostly at the periphery of the lingula and left greater than right lung base. No pleural effusion. Heart size within normal limits. Hepatobiliary: No focal liver abnormality is seen. Status post cholecystectomy. No biliary dilatation. Pancreas: Unremarkable. No pancreatic ductal dilatation or surrounding inflammatory changes. Spleen: Normal in size without focal abnormality. Adrenals/Urinary Tract: Adrenal glands are normal. Nonspecific perinephric fat stranding. No hydronephrosis. Stomach/Bowel: Stomach is nonenlarged. No dilated small bowel. History of prior appendectomy. No bowel wall thickening. Vascular/Lymphatic: Extensive atherosclerosis. No aneurysmal dilatation. Subcentimeter retroperitoneal nodes. Reproductive: Status post hysterectomy. No adnexal masses. Other: Negative for free air or free fluid. Peritoneal dialysis catheter in the left lower quadrant. Musculoskeletal: No acute osseous abnormality. Deep wound with soft tissue thickening and small gas pockets superficial to the sacrococcygeal region at the site of prior rim enhancing fluid collection. IMPRESSION: 1. Patchy foci of ground-glass density and consolidation at the peripheral lung bases and lingula suspect for multifocal infection/possible atypical or viral pneumonia. 2. Deep wound superficial to the sacrococcygeal region with soft tissue thickening and small gas locules with probable small amount of surgical change in the region. No definitive osseous destruction of the underlying sacrococcygeal  bone to suggest osteomyelitis by CT. Electronically Signed   By: Donavan Foil M.D.   On: 02/24/2019 19:25   DG Chest Portable 1 View  Result Date: 02/24/2019 CLINICAL DATA:  Shortness of breath, abdominal pain, vomiting EXAM: PORTABLE CHEST 1 VIEW COMPARISON:  09/16/2018 FINDINGS: Lungs are clear.  No pleural effusion or pneumothorax. The  heart is normal in size. IMPRESSION: No evidence of acute cardiopulmonary disease. Electronically Signed   By: Julian Hy M.D.   On: 02/24/2019 21:06    Procedures Procedures (including critical care time)  Medications Ordered in ED Medications  sodium chloride flush (NS) 0.9 % injection 3 mL (3 mLs Intravenous Not Given 02/24/19 2053)  sodium chloride 0.9 % bolus 500 mL (0 mLs Intravenous Stopped 02/24/19 2046)  ondansetron (ZOFRAN-ODT) disintegrating tablet 4 mg (4 mg Oral Given 02/24/19 1738)  morphine 4 MG/ML injection 4 mg (4 mg Intravenous Given 02/24/19 2050)  metoCLOPramide (REGLAN) injection 10 mg (10 mg Intravenous Given 02/24/19 2153)    ED Course  I have reviewed the triage vital signs and the nursing notes.  Pertinent labs & imaging results that were available during my care of the patient were reviewed by me and considered in my medical decision making (see chart for details).  Clinical Course as of Feb 24 2216  Sat Feb 24, 2019  2215 Spoke to Dr. Marlowe Sax who agrees to admit patient for further treatment   [CA]    Clinical Course User Index [CA] Suzy Bouchard, PA-C   MDM Rules/Calculators/A&P                     49 year old female presents to the ED due to generalized abdominal pain and worsening emesis x3 to 4 days.  Patient has a history of gastroparesis and states this feels similar to her past episodes.  Patient admits to 15 episodes of nonbloody, nonbilious emesis for the past 2 days.  Upon arrival, patient afebrile, but tachycardic at 122 with otherwise unremarkable vitals.  Abdomen soft, nondistended with generalized abdominal tenderness most significant in the epigastric and right upper quadrant region.  No peritoneal signs.  CBC reassuring with no leukocytosis.  Hemoglobin appears better than baseline at 10.9.  CMP significant for worsening renal function with creatinine at 15.56 and BUN at 78 with hyperkalemia at 5.4.  Anion gap of 20 with normal  glucose.  Doubt DKA.  EKG personally reviewed which demonstrates sinus tachycardia with no changes from previous EKG.  CT scan personally reviewed which demonstrates:   1. Patchy foci of ground-glass density and consolidation at the  peripheral lung bases and lingula suspect for multifocal  infection/possible atypical or viral pneumonia.  2. Deep wound superficial to the sacrococcygeal region with soft  tissue thickening and small gas locules with probable small amount  of surgical change in the region. No definitive osseous destruction  of the underlying sacrococcygeal bone to suggest osteomyelitis by  CT.   COVID test ordered given her ground-glass density and consolidation. Informed by RN that patient continues to vomit. Will consult admitting team for admission for intractable nausea and vomiting. Discussed case with Dr. Wyvonnia Dusky who evaluated patient at bedside and agrees with assessment and plan.   Discussed case with Dr. Marlowe Sax who agrees to admit patient for further treatment. COVID test pending. Still waiting for peritoneal fluid to be drawn.  Final Clinical Impression(s) / ED Diagnoses Final diagnoses:  None    Rx / DC Orders ED Discharge Orders  None       Karie Kirks 02/24/19 2220    Ezequiel Essex, MD 02/25/19 [phone]

## 2019-02-24 NOTE — ED Notes (Signed)
Per EDP contacted HD to remove and test fluid on this pt. Leafy Ro said the on call RN would be here by 6 and that she would inform them of this request.

## 2019-02-24 NOTE — H&P (Addendum)
History and Physical    Susan Horton TDD:[id number] DOB: November 09, 1970 DOA: 02/24/2019  PCP: Charlott Rakes, MD Patient coming from: Home  Chief Complaint: Abdominal pain, emesis  HPI: Susan Horton is a 49 y.o. female with medical history significant of ESRD on peritoneal dialysis, insulin-dependent type 2 diabetes, hypertension, CHF, PVD, CAD status post PCI, GERD, gastroparesis, history of cholecystectomy, history of sacral abscess presenting to the ED for evaluation of abdominal pain and emesis.  Patient reports 4-day history of nonbloody, nonbilious emesis.  She has not been able to tolerate p.o. intake.  Also having epigastric abdominal pain.  States nausea/vomiting is a chronic problem for her due to diabetic gastroparesis but it is much worse now.  Denies fevers, chills, or diarrhea.  No recent sick contacts.  Denies cough, shortness of breath, or chest pain.  ED Course: Afebrile.  Slightly tachycardic.  Blood pressure elevated.  Not tachypneic or hypoxic.  Labs showing no leukocytosis.  Hemoglobin 10.9, at baseline.  Potassium 5.4.  Bicarb 20, anion gap 20.  BUN 78, creatinine 15.5.  Baseline creatinine in the 9-10 range.  Lipase and LFTs normal.  Beta-hCG negative.  UA pending.  Peritoneal fluid labs sent out for testing to rule out peritonitis.  SARS-CoV-2 PCR test pending.  Influenza panel pending. Chest x-ray showing no active cardiopulmonary disease. CT abdomen pelvis without acute intra-abdominal pathology.  Showing patchy foci of groundglass density and consolidation at the peripheral lung bases and lingula suspicious for multifocal pneumonia/atypical or viral infection.  Also showing deep wound superficial to the sacrococcygeal region with soft tissue thickening and small gas locules with probable small amount of surgical change in the region.  No definite osseous destruction of the underlying sacrococcygeal bone to suggest osteomyelitis.  Patient received Reglan, morphine, Zofran, and  a 500 cc fluid bolus.  Review of Systems:  All systems reviewed and apart from history of presenting illness, are negative.  Past Medical History:  Diagnosis Date  . Abnormal stress test   . Anemia   . Angio-edema   . Asthma   . CAD (coronary artery disease)    DES to mid LAD July 2018, residual moderate RCA disease  . Cataract   . CKD (chronic kidney disease) stage 4, GFR 15-29 ml/min (HCC)    Dialysis T/Th/Sa  . DVT (deep venous thrombosis) ([place])    1996 during pregnancy, 2015 left leg  . Eczema   . Essential hypertension 12/11/2015  . Gastroparesis   . GERD (gastroesophageal reflux disease)   . Gluteal abscess 12/27/2018  . Hidradenitis   . Migraine   . Neuropathy   . Peripheral vascular disease ([place])    blood clot in leg  . Progressive angina ([place]) 06/05/2014   Chest pain  . Type 2 diabetes mellitus ([place])   . Type II diabetes mellitus ([place])   . Urticaria     Past Surgical History:  Procedure Laterality Date  . A/V FISTULAGRAM N/A 06/22/2017   Procedure: A/V FISTULAGRAM - left arm;  Surgeon: [name], MD;  Location: [facility];  Service: Cardiovascular;  Laterality: N/A;  . ABDOMINAL AORTOGRAM W/LOWER EXTREMITY Bilateral 12/14/2018   Procedure: ABDOMINAL AORTOGRAM W/LOWER EXTREMITY;  Surgeon: [name], MD;  Location: [facility];  Service: Cardiovascular;  Laterality: Bilateral;  . ABDOMINOPLASTY    . ADENOIDECTOMY    . APPENDECTOMY  1995  . AV FISTULA PLACEMENT Left 02/01/2017   Procedure: ARTERIOVENOUS BRACHIOCEPHALIC (AV) FISTULA CREATION;  Surgeon: Ruta Hinds  E, MD;  Location: [facility];  Service: Vascular;  Laterality: Left;  . CHOLECYSTECTOMY  1993  . CORONARY ANGIOPLASTY WITH STENT PLACEMENT    . CORONARY STENT INTERVENTION N/A 08/05/2016   Procedure: Coronary Stent Intervention;  Surgeon: [name], MD;  Location: [facility];  Service: Cardiovascular;  Laterality: N/A;  . EXPLORATORY LAPAROTOMY   08/14/2005   lysis of adhesions, drainage of tubo-ovarian abscess  . FISTULA SUPERFICIALIZATION Left 09/14/2017   Procedure: FISTULA SUPERFICIALIZATION ARTERIOVENOUS FISTULA LEFT ARM;  Surgeon: [name], MD;  Location: [facility];  Service: Vascular;  Laterality: Left;  . FLEXIBLE SIGMOIDOSCOPY N/A 01/04/2019   Procedure: FLEXIBLE SIGMOIDOSCOPY;  Surgeon: Clarene Essex, MD;  Location: [facility];  Service: Endoscopy;  Laterality: N/A;  . HYDRADENITIS EXCISION  02/08/2011   Procedure: EXCISION HYDRADENITIS GROIN;  Surgeon: [name], MD;  Location: [facility];  Service: General;  Laterality: N/A;  Excisioin of Hidradenitis Left groin  . INCISION AND DRAINAGE ABSCESS N/A 01/11/2019   Procedure: INCISION AND DRAINAGE SACRAL ABSCESS;  Surgeon: Georganna Skeans, MD;  Location: [facility];  Service: General;  Laterality: N/A;  . INGUINAL HIDRADENITIS EXCISION  07/06/2010   bilateral  . INSERTION OF DIALYSIS CATHETER N/A 09/14/2017   Procedure: INSERTION OF TUNNELED DIALYSIS CATHETER Right Internal Jugular;  Surgeon: [name], MD;  Location: [facility];  Service: Vascular;  Laterality: N/A;  . IR US GUIDE BX ASP/DRAIN  01/01/2019  . LEFT HEART CATH AND CORONARY ANGIOGRAPHY N/A 08/05/2016   Procedure: Left Heart Cath and Coronary Angiography;  Surgeon: [name], MD;  Location: [facility];  Service: Cardiovascular;  Laterality: N/A;  . PERIPHERAL VASCULAR INTERVENTION Left 12/14/2018   Procedure: PERIPHERAL VASCULAR INTERVENTION;  Surgeon: [name], MD;  Location: [facility];  Service: Cardiovascular;  Laterality: Left;  SFA  . REDUCTION MAMMAPLASTY  2002  . TONSILLECTOMY    . TUBAL LIGATION  1996  . VAGINAL HYSTERECTOMY  08/04/2005   and cysto     reports that she quit smoking about 2 years ago. Her smoking use included cigarettes. She quit after 20.00 years of use. She has never used smokeless tobacco. She reports that she does not drink  alcohol or use drugs.  Allergies  Allergen Reactions  . Repatha [Evolocumab] Itching  . Lisinopril Cough    Family History  Problem Relation Age of Onset  . Allergic rhinitis Mother   . Hypertension Father   . Allergic rhinitis Father   . Hypertension Sister   . Allergic rhinitis Sister   . Hypertension Brother   . Allergic rhinitis Brother   . Breast cancer Cousin        x2    Prior to Admission medications   Medication Sig Start Date End Date Taking? Authorizing Provider  albuterol (PROVENTIL HFA;VENTOLIN HFA) 108 (90 BASE) MCG/ACT inhaler Inhale 1-2 puffs into the lungs every 6 (six) hours as needed for wheezing or shortness of breath.    Yes [provider]  Alirocumab (PRALUENT) 75 MG/ML SOAJ Inject 75 mg into the skin every 14 (fourteen) days. 01/24/19  Yes [name], MD  amitriptyline (ELAVIL) 10 MG tablet Take 1 tablet (10 mg total) by mouth at bedtime. 09/19/18  Yes Jamse Arn, MD  aspirin 81 MG chewable tablet Chew 1 tablet (81 mg total) by mouth daily. 08/07/16  Yes Reino Bellis B, NP  atorvastatin (LIPITOR) 40 MG tablet Take 1 tablet (40 mg  total) by mouth daily. 01/23/19 04/23/19 Yes [name], MD  B Complex-C-Zn-Folic Acid (DIALYVITE 381 WITH ZINC) 0.8 MG TABS Take 1 tablet by mouth daily. 10/06/18  Yes [provider]  calcitRIOL (ROCALTROL) 0.5 MCG capsule Take 1.5 mcg by mouth daily. 09/11/18  Yes [provider]  calcium acetate (PHOSLO) 667 MG capsule Take 1,334-2,001 mg by mouth See admin instructions. Take three capsules (2001 mg) by mouth up to three times daily with meals and two capsules (1334 mg) with snacks 09/23/17  Yes [provider]  clopidogrel (PLAVIX) 75 MG tablet Take 1 tablet (75 mg total) by mouth daily with breakfast. 10/19/17  Yes Kilroy, Luke K, PA-C  Ferrous Sulfate (IRON) 325 (65 Fe) MG TABS Take 325 mg by mouth daily. 11/25/15  Yes Ena Dawley, Tiffany S, PA-C  fluconazole (DIFLUCAN) 150 MG tablet  Take 1 tablet (150 mg total) by mouth daily. 01/14/19  Yes Rory Percy, DO  Fluticasone-Salmeterol (ADVAIR) 250-50 MCG/DOSE AEPB Inhale 1 puff into the lungs 2 (two) times daily. Patient taking differently: Inhale 1 puff into the lungs daily as needed (shortness of breath).  06/10/17  Yes Charlott Rakes, MD  gentamicin cream (GARAMYCIN) 0.1 % Apply 1 application topically See admin instructions. Apply to access site nightly 09/11/18  Yes [provider]  Insulin Glargine, 1 Unit Dial, (TOUJEO SOLOSTAR) 300 UNIT/ML SOPN Inject 10 Units into the skin 2 (two) times daily. 01/05/19  Yes Lattie Haw, MD  insulin regular (HUMULIN R) 100 units/mL injection Inject 0.2-0.25 mLs (20-25 Units total) into the skin See admin instructions. Patient taking differently: Inject 7-15 Units into the skin 3 (three) times daily before meals. Per CBG 09/22/17  Yes Philemon Kingdom, MD  isosorbide mononitrate (IMDUR) 30 MG 24 hr tablet Take 0.5 tablets (15 mg total) by mouth daily. 10/19/17 02/24/19 Yes Kilroy, Luke K, PA-C  lidocaine-prilocaine (EMLA) cream Apply 1 application topically See admin instructions. Apply small amount to access site (AVF) 30 minutes before hemodialysis, cover with occlusive dressing (Saran Wrap) if going out to dialysis center 09/15/17  Yes [provider]  methocarbamol (ROBAXIN) 500 MG tablet Take 1 tablet (500 mg total) by mouth 2 (two) times daily as needed for muscle spasms. 09/07/17  Yes Patel, Domenick Bookbinder, MD  metoCLOPramide (REGLAN) 5 MG tablet TAKE 1 TABLET BY MOUTH 4 TIMES DAILY BEFORE MEALS AND AT BEDTIME. Patient taking differently: Take 5 mg by mouth 4 (four) times daily -  before meals and at bedtime.  03/18/17  Yes [name], MD  metoprolol succinate (TOPROL-XL) 50 MG 24 hr tablet Take 1 tablet (50 mg total) by mouth daily. 03/14/18  Yes [name], MD  montelukast (SINGULAIR) 10 MG tablet Take 1 tablet (10 mg total) by mouth at bedtime. Patient taking  differently: Take 10 mg by mouth at bedtime as needed (allergies).  04/12/18  Yes Padgett, Rae Halsted, MD  nitroGLYCERIN (NITROSTAT) 0.4 MG SL tablet Place 1 tablet (0.4 mg total) under the tongue every 5 (five) minutes as needed for chest pain. 10/19/17  Yes Kilroy, Luke K, PA-C  pantoprazole (PROTONIX) 40 MG tablet TAKE 1 TABLET BY MOUTH DAILY. Patient taking differently: Take 40 mg by mouth daily.  02/14/18  Yes [name], MD  pregabalin (LYRICA) 75 MG capsule Take 1 capsule (75 mg total) by mouth daily. 09/19/18  Yes Jamse Arn, MD  trimethoprim-polymyxin b (POLYTRIM) ophthalmic solution Place 1 drop into both eyes See admin instructions. Instill one drop into  both eyes four times daily for 2 days after each monthly eye injection 06/16/17  Yes [provider]  mupirocin cream (BACTROBAN) 2 % Apply 1 application topically 2 (two) times daily as needed. To affected areas and cover with non-stick gauze. Patient not taking: Reported on 02/24/2019 11/17/18   Katy Apo, NP    Physical Exam: Vitals:   02/24/19 1749 02/24/19 1751 02/24/19 1826 02/24/19 2043  BP: (!) 179/87  (!) 186/90 (!) 179/88  Pulse:  (!) 113 (!) 114 (!) 111  Resp:  14 13 16   Temp:      TempSrc:      SpO2:  100% 100% 100%    Physical Exam  Constitutional: She is oriented to person, place, and time. She appears well-developed and well-nourished. No distress.  HENT:  Head: Normocephalic.  Eyes: Right eye exhibits no discharge. Left eye exhibits no discharge.  Cardiovascular: Regular rhythm and intact distal pulses.  Slightly tachycardic  Pulmonary/Chest: Effort normal and breath sounds normal. No respiratory distress. She has no wheezes. She has no rales.  Abdominal: Soft. Bowel sounds are normal. She exhibits no distension. There is abdominal tenderness. There is guarding. There is no rebound.  Epigastrium tender to palpation with guarding  Musculoskeletal:        General: No edema.      Cervical back: Neck supple.  Neurological: She is alert and oriented to person, place, and time.  Skin: Skin is warm and dry. She is not diaphoretic.     Labs on Admission: I have personally reviewed following labs and imaging studies  CBC: Recent Labs  Lab 02/24/19 1622  WBC 7.4  HGB 10.9*  HCT 36.2  MCV 86.4  PLT 323*   Basic Metabolic Panel: Recent Labs  Lab 02/24/19 1622  NA 141  K 5.4*  CL 101  CO2 20*  GLUCOSE 74  BUN 78*  CREATININE 15.56*  CALCIUM 8.9   GFR: CrCl cannot be calculated (Unknown ideal weight.). Liver Function Tests: Recent Labs  Lab 02/24/19 1622  AST 16  ALT 16  ALKPHOS 93  BILITOT 1.1  PROT 7.6  ALBUMIN 2.8*   Recent Labs  Lab 02/24/19 1622  LIPASE 32   No results for input(s): AMMONIA in the last 168 hours. Coagulation Profile: No results for input(s): INR, PROTIME in the last 168 hours. Cardiac Enzymes: No results for input(s): CKTOTAL, CKMB, CKMBINDEX, TROPONINI in the last 168 hours. BNP (last 3 results) No results for input(s): PROBNP in the last 8760 hours. HbA1C: No results for input(s): HGBA1C in the last 72 hours. CBG: No results for input(s): GLUCAP in the last 168 hours. Lipid Profile: No results for input(s): CHOL, HDL, LDLCALC, TRIG, CHOLHDL, LDLDIRECT in the last 72 hours. Thyroid Function Tests: No results for input(s): TSH, T4TOTAL, FREET4, T3FREE, THYROIDAB in the last 72 hours. Anemia Panel: No results for input(s): VITAMINB12, FOLATE, FERRITIN, TIBC, IRON, RETICCTPCT in the last 72 hours. Urine analysis:    Component Value Date/Time   COLORURINE YELLOW 03/17/2017 0034   APPEARANCEUR CLEAR 03/17/2017 0034   LABSPEC 1.011 03/17/2017 0034   PHURINE 6.0 03/17/2017 0034   GLUCOSEU >=500 (A) 03/17/2017 0034   HGBUR NEGATIVE 03/17/2017 0034   BILIRUBINUR NEGATIVE 03/17/2017 0034   BILIRUBINUR negative 01/13/2016 1135   KETONESUR NEGATIVE 03/17/2017 0034   PROTEINUR >=300 (A) 03/17/2017 0034    UROBILINOGEN 0.2 01/13/2016 1135   UROBILINOGEN 0.2 06/20/2014 1120   NITRITE NEGATIVE 03/17/2017 0034   LEUKOCYTESUR NEGATIVE 03/17/2017 0034  Radiological Exams on Admission: CT ABDOMEN PELVIS WO CONTRAST  Result Date: 02/24/2019 CLINICAL DATA:  Generalized abdominal pain with vomiting EXAM: CT ABDOMEN AND PELVIS WITHOUT CONTRAST TECHNIQUE: Multidetector CT imaging of the abdomen and pelvis was performed following the standard protocol without IV contrast. COMPARISON:  CT 12/27/2018, 12/25/2018, MRI 01/10/2019 FINDINGS: Lower chest: Lung bases demonstrate scattered foci of ground-glass density and patchy consolidation mostly at the periphery of the lingula and left greater than right lung base. No pleural effusion. Heart size within normal limits. Hepatobiliary: No focal liver abnormality is seen. Status post cholecystectomy. No biliary dilatation. Pancreas: Unremarkable. No pancreatic ductal dilatation or surrounding inflammatory changes. Spleen: Normal in size without focal abnormality. Adrenals/Urinary Tract: Adrenal glands are normal. Nonspecific perinephric fat stranding. No hydronephrosis. Stomach/Bowel: Stomach is nonenlarged. No dilated small bowel. History of prior appendectomy. No bowel wall thickening. Vascular/Lymphatic: Extensive atherosclerosis. No aneurysmal dilatation. Subcentimeter retroperitoneal nodes. Reproductive: Status post hysterectomy. No adnexal masses. Other: Negative for free air or free fluid. Peritoneal dialysis catheter in the left lower quadrant. Musculoskeletal: No acute osseous abnormality. Deep wound with soft tissue thickening and small gas pockets superficial to the sacrococcygeal region at the site of prior rim enhancing fluid collection. IMPRESSION: 1. Patchy foci of ground-glass density and consolidation at the peripheral lung bases and lingula suspect for multifocal infection/possible atypical or viral pneumonia. 2. Deep wound superficial to the sacrococcygeal  region with soft tissue thickening and small gas locules with probable small amount of surgical change in the region. No definitive osseous destruction of the underlying sacrococcygeal bone to suggest osteomyelitis by CT. Electronically Signed   By: Donavan Foil M.D.   On: 02/24/2019 19:25   DG Chest Portable 1 View  Result Date: 02/24/2019 CLINICAL DATA:  Shortness of breath, abdominal pain, vomiting EXAM: PORTABLE CHEST 1 VIEW COMPARISON:  09/16/2018 FINDINGS: Lungs are clear.  No pleural effusion or pneumothorax. The heart is normal in size. IMPRESSION: No evidence of acute cardiopulmonary disease. Electronically Signed   By: Julian Hy M.D.   On: 02/24/2019 21:06    EKG: Independently reviewed.  Sinus tachycardia, heart rate 113.  Assessment/Plan Principal Problem:   Intractable nausea and vomiting Active Problems:   Gastroparesis   Essential hypertension, benign   Epigastric abdominal pain   Multifocal pneumonia   Epigastric abdominal pain, intractable nausea and vomiting -suspect due to diabetic gastroparesis CT abdomen pelvis without acute intra-abdominal pathology.  Lipase and LFTs normal. -Continue gentle IV fluid hydration (dialysis patient) -Scheduled Reglan -Zofran as needed for nausea/vomiting -Morphine as needed for pain -Continue PPI for GERD -Peritoneal fluid labs sent out for testing to rule out peritonitis.  Although low suspicion given no fever or leukocytosis. Addendum: Covid positive.  Viral gastroenteritis also likely contributing.  Multifocal pneumonia, suspect due to viral/atypical infection Patient is not endorsing any respiratory symptoms.  No tachypnea or hypoxia.  CT abdomen pelvis with incidental finding of patchy foci of groundglass density and consolidation at the peripheral lung bases and lingula suspicious for multifocal pneumonia/atypical or viral infection.   -SARS-CoV-2 PCR test and influenza panel pending -Continue airborne contact  precautions -Decadron 6 mg daily -Start remdesivir if SARS-CoV-2 PCR test positive -Bacterial pneumonia less likely given no fever or leukocytosis.  Check procalcitonin level.  If elevated, start antibiotics. -Continuous pulse ox, supplemental oxygen as needed to keep oxygen saturation above 92%  Addendum: SARS-CoV-2 PCR test positive.  Influenza panel negative.  Start remdesivir.  Check inflammatory markers.  ESRD on PD BUN 78, creatinine  15.5.  Baseline creatinine in the 9-10 range. Potassium 5.4.  No signs of volume overload at this time. -Continue home renal supplements -Consult nephrology in a.m.  High anion gap metabolic acidosis Likely related to ESRD. Bicarb 20, anion gap 20.    -Consult nephrology in a.m. as mentioned above  History of sacral abscess Patient was recently admitted in 12/29-1/3 for a sacral abscess.  Status post I&D on 12/31.  Infectious disease was consulted and recommended 14-day course of linezolid.  CT done today showing deep wound superficial to the sacrococcygeal region with soft tissue thickening and small gas locules with probable small amount of surgical change in the region.  No definite osseous destruction of the underlying sacrococcygeal bone to suggest osteomyelitis. -Wound care -Patient is followed by infectious disease, ensure follow-up.  Insulin-dependent type 2 diabetes -Continue insulin glargine 10 units twice daily -Sliding scale insulin very sensitive before meals and CBG checks  Hypertension Blood pressure elevated. -Continue home Imdur, metoprolol -Hydralazine as needed for SBP >170  DVT prophylaxis: Subcutaneous heparin Code Status: Full code Family Communication: No family available at this time. Disposition Plan: Anticipate discharge after clinical improvement. Consults called: None Admission status: It is my clinical opinion that admission to [facility] is reasonable and necessary in this 49 y.o. female . presenting with abdominal  pain, intractable nausea and vomiting secondary to diabetic gastroparesis.  Unable to tolerate p.o. intake.  Needs IV fluid, antiemetic, and Reglan.  Given the aforementioned, the predictability of an adverse outcome is felt to be significant. I expect that the patient will require at least 2 midnights in the hospital to treat this condition.   The medical decision making on this patient was of high complexity and the patient is at high risk for clinical deterioration, therefore this is a level 3 visit.  Shela Leff MD [facility]  If 7PM-7AM, please contact night-coverage [email or web address] Password [facility]  02/24/2019, 10:54 PM

## 2019-02-24 NOTE — ED Notes (Signed)
Pt transported to CT ?

## 2019-02-25 ENCOUNTER — Encounter (HOSPITAL_COMMUNITY): Payer: Self-pay | Admitting: Internal Medicine

## 2019-02-25 ENCOUNTER — Other Ambulatory Visit: Payer: Self-pay

## 2019-02-25 DIAGNOSIS — U071 COVID-19: Principal | ICD-10-CM

## 2019-02-25 DIAGNOSIS — I10 Essential (primary) hypertension: Secondary | ICD-10-CM

## 2019-02-25 DIAGNOSIS — K3184 Gastroparesis: Secondary | ICD-10-CM

## 2019-02-25 LAB — GLUCOSE, CAPILLARY
Glucose-Capillary: 117 mg/dL — ABNORMAL HIGH (ref 70–99)
Glucose-Capillary: 117 mg/dL — ABNORMAL HIGH (ref 70–99)
Glucose-Capillary: 126 mg/dL — ABNORMAL HIGH (ref 70–99)
Glucose-Capillary: 143 mg/dL — ABNORMAL HIGH (ref 70–99)
Glucose-Capillary: 151 mg/dL — ABNORMAL HIGH (ref 70–99)
Glucose-Capillary: 302 mg/dL — ABNORMAL HIGH (ref 70–99)
Glucose-Capillary: 64 mg/dL — ABNORMAL LOW (ref 70–99)
Glucose-Capillary: 64 mg/dL — ABNORMAL LOW (ref 70–99)
Glucose-Capillary: 67 mg/dL — ABNORMAL LOW (ref 70–99)
Glucose-Capillary: 82 mg/dL (ref 70–99)

## 2019-02-25 LAB — URINALYSIS, ROUTINE W REFLEX MICROSCOPIC
Bilirubin Urine: NEGATIVE
Glucose, UA: 50 mg/dL — AB
Ketones, ur: 20 mg/dL — AB
Leukocytes,Ua: NEGATIVE
Nitrite: NEGATIVE
Protein, ur: 100 mg/dL — AB
Specific Gravity, Urine: 1.012 (ref 1.005–1.030)
pH: 8 (ref 5.0–8.0)

## 2019-02-25 LAB — FERRITIN: Ferritin: 2091 ng/mL — ABNORMAL HIGH (ref 11–307)

## 2019-02-25 LAB — RESPIRATORY PANEL BY RT PCR (FLU A&B, COVID)
Influenza A by PCR: NEGATIVE
Influenza B by PCR: NEGATIVE
SARS Coronavirus 2 by RT PCR: POSITIVE — AB

## 2019-02-25 LAB — BASIC METABOLIC PANEL
Anion gap: 16 — ABNORMAL HIGH (ref 5–15)
BUN: 76 mg/dL — ABNORMAL HIGH (ref 6–20)
CO2: 20 mmol/L — ABNORMAL LOW (ref 22–32)
Calcium: 8 mg/dL — ABNORMAL LOW (ref 8.9–10.3)
Chloride: 102 mmol/L (ref 98–111)
Creatinine, Ser: 14.75 mg/dL — ABNORMAL HIGH (ref 0.44–1.00)
GFR calc Af Amer: 3 mL/min — ABNORMAL LOW (ref >60)
GFR calc non Af Amer: 3 mL/min — ABNORMAL LOW (ref >60)
Glucose, Bld: 168 mg/dL — ABNORMAL HIGH (ref 70–99)
Potassium: 5.7 mmol/L — ABNORMAL HIGH (ref 3.5–5.1)
Sodium: 138 mmol/L (ref 135–145)

## 2019-02-25 LAB — PROCALCITONIN: Procalcitonin: 0.36 ng/mL

## 2019-02-25 LAB — FIBRINOGEN: Fibrinogen: 526 mg/dL — ABNORMAL HIGH (ref 210–475)

## 2019-02-25 LAB — D-DIMER, QUANTITATIVE: D-Dimer, Quant: 1.62 ug/mL-FEU — ABNORMAL HIGH (ref 0.00–0.50)

## 2019-02-25 LAB — LACTATE DEHYDROGENASE: LDH: 163 U/L (ref 98–192)

## 2019-02-25 LAB — C-REACTIVE PROTEIN: CRP: 2.9 mg/dL — ABNORMAL HIGH (ref <1.0)

## 2019-02-25 MED ORDER — CALCIUM ACETATE (PHOS BINDER) 667 MG PO CAPS
2001.0000 mg | ORAL_CAPSULE | Freq: Three times a day (TID) | ORAL | Status: DC
  Administered 2019-02-25 – 2019-03-02 (×12): 2001 mg via ORAL
  Filled 2019-02-25 (×12): qty 3

## 2019-02-25 MED ORDER — SODIUM CHLORIDE 0.9 % IV SOLN
200.0000 mg | Freq: Once | INTRAVENOUS | Status: AC
  Administered 2019-02-25: 200 mg via INTRAVENOUS
  Filled 2019-02-25: qty 200

## 2019-02-25 MED ORDER — DELFLEX-LC/1.5% DEXTROSE 344 MOSM/L IP SOLN
INTRAPERITONEAL | Status: DC
  Administered 2019-02-28: 5000 mL via INTRAPERITONEAL

## 2019-02-25 MED ORDER — METOCLOPRAMIDE HCL 5 MG/ML IJ SOLN
5.0000 mg | Freq: Three times a day (TID) | INTRAMUSCULAR | Status: DC
  Administered 2019-02-25 – 2019-02-27 (×6): 5 mg via INTRAVENOUS
  Filled 2019-02-25 (×6): qty 2

## 2019-02-25 MED ORDER — SODIUM ZIRCONIUM CYCLOSILICATE 10 G PO PACK
10.0000 g | PACK | Freq: Once | ORAL | Status: AC
  Administered 2019-02-25: 10 g via ORAL
  Filled 2019-02-25: qty 1

## 2019-02-25 MED ORDER — INSULIN GLARGINE 100 UNIT/ML ~~LOC~~ SOLN
5.0000 [IU] | Freq: Two times a day (BID) | SUBCUTANEOUS | Status: DC
  Filled 2019-02-25: qty 0.05

## 2019-02-25 MED ORDER — GENTAMICIN SULFATE 0.1 % EX CREA
1.0000 "application " | TOPICAL_CREAM | Freq: Every day | CUTANEOUS | Status: DC
  Administered 2019-02-25 – 2019-03-02 (×6): 1 via TOPICAL
  Filled 2019-02-25 (×2): qty 15

## 2019-02-25 MED ORDER — HEPARIN 1000 UNIT/ML FOR PERITONEAL DIALYSIS
INTRAPERITONEAL | Status: DC | PRN
  Filled 2019-02-25: qty 5000

## 2019-02-25 MED ORDER — SODIUM CHLORIDE 0.9 % IV SOLN
100.0000 mg | Freq: Every day | INTRAVENOUS | Status: DC
  Administered 2019-02-26 – 2019-02-27 (×2): 100 mg via INTRAVENOUS
  Filled 2019-02-25 (×4): qty 20

## 2019-02-25 MED ORDER — GLUCOSE 40 % PO GEL: 1.0000 | ORAL | Status: AC

## 2019-02-25 MED ORDER — ENSURE ENLIVE PO LIQD: 237.0000 mL | Freq: Two times a day (BID) | ORAL | Status: DC

## 2019-02-25 MED ORDER — DEXTROSE 50 % IV SOLN
INTRAVENOUS | Status: AC
  Administered 2019-02-25: 25 mL via INTRAMUSCULAR
  Filled 2019-02-25: qty 50

## 2019-02-25 MED ORDER — HEPARIN 1000 UNIT/ML FOR PERITONEAL DIALYSIS: 500.0000 [IU] | INTRAMUSCULAR | Status: DC | PRN

## 2019-02-25 MED ORDER — DELFLEX-LC/2.5% DEXTROSE 394 MOSM/L IP SOLN: INTRAPERITONEAL | Status: DC

## 2019-02-25 MED ORDER — INSULIN GLARGINE 100 UNIT/ML ~~LOC~~ SOLN: 6.0000 [IU] | Freq: Two times a day (BID) | SUBCUTANEOUS | Status: DC

## 2019-02-25 MED ORDER — CALCIUM ACETATE (PHOS BINDER) 667 MG PO CAPS: 1334.0000 mg | ORAL_CAPSULE | Freq: Two times a day (BID) | ORAL | Status: DC | PRN

## 2019-02-25 NOTE — Plan of Care (Signed)

## 2019-02-25 NOTE — Consult Note (Addendum)
Renal Service Consult Note [facility]  Susan Horton 02/25/2019 Sol Blazing Requesting Physician:  Dr Marlowe Sax  Reason for Consult:  ESRD pt w/ COVID+ pna HPI: The patient is a 49 y.o. year-old w/ hx of DM2, HTN, ESRD on PD, gastroparesis, CAD h/o PCI, PAD and recent hx of sacral abscess (rx I&D 12/31 + zyvox for 14d) presented 2/13 w/ recurrent N/V.  BP's in ED were high, WBC nl, not hypoxic. CXR clear, abd CT w/ small GG chg's peripherally at lung bases, COVID test returned +. Pt admitted to 5W. Asked to see for ESRD.    Pt did HD x 2 mos approx then has been on PD for about 1 yr. Has LUA AVF didn't work well, used [facility] for HD.  No recent PD issues, uses mix of 1.5and 2.5% fluids. Not sure if still supposed to be doing 1 L day dwell.   Sacral wound per pt is not healed over but continues to improve.     ROS  denies CP  no joint pain   no HA  no blurry vision  no rash  no diarrhea  no nausea/ vomiting  no dysuria  no difficulty voiding  no change in urine color    Past Medical History  Past Medical History:  Diagnosis Date  . Abnormal stress test   . Anemia   . Angio-edema   . Asthma   . CAD (coronary artery disease)    DES to mid LAD July 2018, residual moderate RCA disease  . Cataract   . CKD (chronic kidney disease) stage 4, GFR 15-29 ml/min (HCC)    Dialysis T/Th/Sa  . DVT (deep venous thrombosis) ([place])    1996 during pregnancy, 2015 left leg  . Eczema   . Essential hypertension 12/11/2015  . Gastroparesis   . GERD (gastroesophageal reflux disease)   . Gluteal abscess 12/27/2018  . Hidradenitis   . Migraine   . Neuropathy   . Peripheral vascular disease ([place])    blood clot in leg  . Progressive angina ([place]) 06/05/2014   Chest pain  . Type 2 diabetes mellitus ([place])   . Type II diabetes mellitus ([place])   . Urticaria    Past Surgical History  Past Surgical History:  Procedure Laterality Date  . A/V FISTULAGRAM N/A 06/22/2017   Procedure: A/V FISTULAGRAM - left arm;  Surgeon: [name], MD;  Location: [place] [facility];  Service: Cardiovascular;  Laterality: N/A;  . ABDOMINAL AORTOGRAM W/LOWER EXTREMITY Bilateral 12/14/2018   Procedure: ABDOMINAL AORTOGRAM W/LOWER EXTREMITY;  Surgeon: [name], MD;  Location: [place] [facility];  Service: Cardiovascular;  Laterality: Bilateral;  . ABDOMINOPLASTY    . ADENOIDECTOMY    . APPENDECTOMY  1995  . AV FISTULA PLACEMENT Left 02/01/2017   Procedure: ARTERIOVENOUS BRACHIOCEPHALIC (AV) FISTULA CREATION;  Surgeon: [name], MD;  Location: [place];  Service: Vascular;  Laterality: Left;  . CHOLECYSTECTOMY  1993  . CORONARY ANGIOPLASTY WITH STENT PLACEMENT    . CORONARY STENT INTERVENTION N/A 08/05/2016   Procedure: Coronary Stent Intervention;  Surgeon: [name], MD;  Location: [place] [facility];  Service: Cardiovascular;  Laterality: N/A;  . EXPLORATORY LAPAROTOMY  08/14/2005   lysis of adhesions, drainage of tubo-ovarian abscess  . FISTULA SUPERFICIALIZATION Left 09/14/2017   Procedure: FISTULA SUPERFICIALIZATION ARTERIOVENOUS FISTULA LEFT ARM;  Surgeon: [name], MD;  Location: [place];  Service: Vascular;  Laterality: Left;  . FLEXIBLE SIGMOIDOSCOPY N/A  01/04/2019   Procedure: FLEXIBLE SIGMOIDOSCOPY;  Surgeon: Clarene Essex, MD;  Location: [facility]OSCOPY;  Service: Endoscopy;  Laterality: N/A;  . HYDRADENITIS EXCISION  02/08/2011   Procedure: EXCISION HYDRADENITIS GROIN;  Surgeon: [name], MD;  Location: [place];  Service: General;  Laterality: N/A;  Excisioin of Hidradenitis Left groin  . INCISION AND DRAINAGE ABSCESS N/A 01/11/2019   Procedure: INCISION AND DRAINAGE SACRAL ABSCESS;  Surgeon: Georganna Skeans, MD;  Location: [place];  Service: General;  Laterality: N/A;  . INGUINAL HIDRADENITIS EXCISION  07/06/2010   bilateral  . INSERTION OF DIALYSIS CATHETER N/A 09/14/2017   Procedure: INSERTION OF  TUNNELED DIALYSIS CATHETER Right Internal Jugular;  Surgeon: [name], MD;  Location: [place];  Service: Vascular;  Laterality: N/A;  . IR US GUIDE BX ASP/DRAIN  01/01/2019  . LEFT HEART CATH AND CORONARY ANGIOGRAPHY N/A 08/05/2016   Procedure: Left Heart Cath and Coronary Angiography;  Surgeon: [name], MD;  Location: [place] [facility];  Service: Cardiovascular;  Laterality: N/A;  . PERIPHERAL VASCULAR INTERVENTION Left 12/14/2018   Procedure: PERIPHERAL VASCULAR INTERVENTION;  Surgeon: [name], MD;  Location: [place] [facility];  Service: Cardiovascular;  Laterality: Left;  SFA  . REDUCTION MAMMAPLASTY  2002  . TONSILLECTOMY    . TUBAL LIGATION  1996  . VAGINAL HYSTERECTOMY  08/04/2005   and cysto   Family History  Family History  Problem Relation Age of Onset  . Allergic rhinitis Mother   . Hypertension Father   . Allergic rhinitis Father   . Hypertension Sister   . Allergic rhinitis Sister   . Hypertension Brother   . Allergic rhinitis Brother   . Breast cancer Cousin        x2   Social History  reports that she quit smoking about 2 years ago. Her smoking use included cigarettes. She quit after 20.00 years of use. She has never used smokeless tobacco. She reports that she does not drink alcohol or use drugs. Allergies  Allergies  Allergen Reactions  . Repatha [Evolocumab] Itching  . Lisinopril Cough   Home medications Prior to Admission medications   Medication Sig Start Date End Date Taking? Authorizing Provider  albuterol (PROVENTIL HFA;VENTOLIN HFA) 108 (90 BASE) MCG/ACT inhaler Inhale 1-2 puffs into the lungs every 6 (six) hours as needed for wheezing or shortness of breath.    Yes [provider]  Alirocumab (PRALUENT) 75 MG/ML SOAJ Inject 75 mg into the skin every 14 (fourteen) days. 01/24/19  Yes [name], MD  amitriptyline (ELAVIL) 10 MG tablet Take 1 tablet (10 mg total) by mouth at bedtime. 09/19/18  Yes Jamse Arn, MD  aspirin 81 MG chewable tablet Chew 1 tablet (81 mg total) by mouth daily. 08/07/16  Yes Cheryln Manly, NP  atorvastatin (LIPITOR) 40 MG tablet Take 1 tablet (40 mg total) by mouth daily. 01/23/19 04/23/19 Yes [name], MD  B Complex-C-Zn-Folic Acid (DIALYVITE 426 WITH ZINC) 0.8 MG TABS Take 1 tablet by mouth daily. 10/06/18  Yes [provider]  calcitRIOL (ROCALTROL) 0.5 MCG capsule Take 1.5 mcg by mouth daily. 09/11/18  Yes [provider]  calcium acetate (PHOSLO) 667 MG capsule Take 1,334-2,001 mg by mouth See admin instructions. Take three capsules (2001 mg) by mouth up to three times daily with meals and two capsules (1334 mg) with snacks 09/23/17  Yes [provider]  clopidogrel (PLAVIX) 75 MG tablet Take 1 tablet (  75 mg total) by mouth daily with breakfast. 10/19/17  Yes Kilroy, Doreene Burke, PA-C  Ferrous Sulfate (IRON) 325 (65 Fe) MG TABS Take 325 mg by mouth daily. 11/25/15  Yes Ena Dawley, Tiffany S, PA-C  fluconazole (DIFLUCAN) 150 MG tablet Take 1 tablet (150 mg total) by mouth daily. 01/14/19  Yes Rory Percy, DO  Fluticasone-Salmeterol (ADVAIR) 250-50 MCG/DOSE AEPB Inhale 1 puff into the lungs 2 (two) times daily. Patient taking differently: Inhale 1 puff into the lungs daily as needed (shortness of breath).  06/10/17  Yes Charlott Rakes, MD  gentamicin cream (GARAMYCIN) 0.1 % Apply 1 application topically See admin instructions. Apply to access site nightly 09/11/18  Yes [provider]  Insulin Glargine, 1 Unit Dial, (TOUJEO SOLOSTAR) 300 UNIT/ML SOPN Inject 10 Units into the skin 2 (two) times daily. 01/05/19  Yes Lattie Haw, MD  insulin regular (HUMULIN R) 100 units/mL injection Inject 0.2-0.25 mLs (20-25 Units total) into the skin See admin instructions. Patient taking differently: Inject 7-15 Units into the skin 3 (three) times daily before meals. Per CBG 09/22/17  Yes Philemon Kingdom, MD  isosorbide mononitrate (IMDUR) 30 MG 24 hr  tablet Take 0.5 tablets (15 mg total) by mouth daily. 10/19/17 02/24/19 Yes Kilroy, Luke K, PA-C  lidocaine-prilocaine (EMLA) cream Apply 1 application topically See admin instructions. Apply small amount to access site (AVF) 30 minutes before hemodialysis, cover with occlusive dressing (Saran Wrap) if going out to dialysis center 09/15/17  Yes [provider]  methocarbamol (ROBAXIN) 500 MG tablet Take 1 tablet (500 mg total) by mouth 2 (two) times daily as needed for muscle spasms. 09/07/17  Yes Patel, Domenick Bookbinder, MD  metoCLOPramide (REGLAN) 5 MG tablet TAKE 1 TABLET BY MOUTH 4 TIMES DAILY BEFORE MEALS AND AT BEDTIME. Patient taking differently: Take 5 mg by mouth 4 (four) times daily -  before meals and at bedtime.  03/18/17  Yes [name], MD  metoprolol succinate (TOPROL-XL) 50 MG 24 hr tablet Take 1 tablet (50 mg total) by mouth daily. 03/14/18  Yes [name], MD  montelukast (SINGULAIR) 10 MG tablet Take 1 tablet (10 mg total) by mouth at bedtime. Patient taking differently: Take 10 mg by mouth at bedtime as needed (allergies).  04/12/18  Yes Padgett, Rae Halsted, MD  nitroGLYCERIN (NITROSTAT) 0.4 MG SL tablet Place 1 tablet (0.4 mg total) under the tongue every 5 (five) minutes as needed for chest pain. 10/19/17  Yes Kilroy, Luke K, PA-C  pantoprazole (PROTONIX) 40 MG tablet TAKE 1 TABLET BY MOUTH DAILY. Patient taking differently: Take 40 mg by mouth daily.  02/14/18  Yes [name], MD  pregabalin (LYRICA) 75 MG capsule Take 1 capsule (75 mg total) by mouth daily. 09/19/18  Yes Jamse Arn, MD  trimethoprim-polymyxin b (POLYTRIM) ophthalmic solution Place 1 drop into both eyes See admin instructions. Instill one drop into both eyes four times daily for 2 days after each monthly eye injection 06/16/17  Yes [provider]  mupirocin cream (BACTROBAN) 2 % Apply 1 application topically 2 (two) times daily as needed. To affected areas and cover with non-stick  gauze. Patient not taking: Reported on 02/24/2019 11/17/18   Katy Apo, NP     Vitals:   02/24/19 2300 02/25/19 0000 02/25/19 0111 02/25/19 0234  BP:  (!) 164/87 (!) 180/82 (!) 141/73  Pulse: (!) 112 (!) 117 (!) 109   Resp: 17 (!) 25 (!) 22   Temp:   97.6 F (  36.4 C)   TempSrc:   Oral   SpO2: 100% 98% 100%   Weight:   64.7 kg   Height:   5\' 5"  (1.651 m)    Exam Gen stable, no cough or ^wob No rash, cyanosis or gangrene Sclera anicteric, throat clear  No jvd or bruits Chest clear bilat to bases no rales RRR no MRG Abd soft ntnd no mass or ascites +bs PD cath RLQ GU  defer MS no joint effusions or deformity Ext no LE or UE  edema, no wounds or ulcers Neuro is alert, Ox 3 , nf    Home meds:  - alirocumab q 2wks sq  - metoprolol xl 50 qd  - aspirin 81/ atorvastatin 40/ sl ntg prn/ clopidogrel 75/ isosorbide mono 15qd  - advair 1 bid/ montelukast 10 hs  - glargine insulin 10u bid/ regular insulin 7-15u ac tid ssi  - prn robaxin/ pregabalin 75   - metclopramide 88m qid' pantoprazole 40  - prn's/ vitamins/ supplements   CXR 2/13 > IMPRESSION: No evidence of acute cardiopulmonary disease  CT abd 02/24/19 > IMPRESSION: 1. Patchy foci of ground-glass density and consolidation at the peripheral lung bases and lingula suspect for multifocal infection/possible atypical or viral pneumonia. 2. Deep wound superficial to the sacrococcygeal region with soft tissue thickening and small gas locules with probable small amount of surgical change in the region. No definitive osseous destruction of the underlying sacrococcygeal bone to suggest osteomyelitis by CT.   Outpt PD: PD orders CCPD, 7X Week, EDW 75.5 (kg) Exchanges 5, fill vol 2000 mL, Dwell Time 1 hrs 30 min, last fill vol 1000 mL, Day Exchanges 0, Day Dwell Time 0 hrs 0 min     Assessment/ Plan: 1. Nausea/ vomiting - per primary, poss d/t COVID infection, also h/o gastroparesis. No abd pain. Doubt peritonitis but will send  fluid in am for TNC and Cx to confirm.  2. ESRD - on PD. Plan PD nightly while here. Creat high , will ^ 4 > 5 fills overnight while here, no day bag.  3. Hyperkalemia - mild, give lokelma x 1, renal diet, PD 4. COVID+ PNA - w/ some GG changes on CT, getting remdesivir, decadron, no IV abx 5. DM on insulin 6. HTN/vol - bp's up a little, cont metop as at home. Euvolemic on exam. Will use one half 1.5% and the rest 2.5%. Well under dry wt if wt's are accurate.  7. CAD h/o stent      Kelly Splinter  MD 02/25/2019, 7:30 AM  Recent Labs  Lab 02/24/19 1622  WBC 7.4  HGB 10.9*   Recent Labs  Lab 02/24/19 1622 02/25/19 0410  K 5.4* 5.7*  BUN 78* 76*  CREATININE 15.56* 14.75*  CALCIUM 8.9 8.0*

## 2019-02-25 NOTE — ED Notes (Signed)
Attempted to call report to 5W 

## 2019-02-25 NOTE — Progress Notes (Signed)
PROGRESS NOTE                                                                                                                                                                                                             Patient Demographics:    Susan Horton, is a 49 y.o. female, DOB - Feb 25, 1970, QIW:[id number]  Admit date - 02/24/2019   Admitting Physician Shela Leff, MD  Outpatient Primary MD for the patient is Charlott Rakes, MD  LOS - 1   Chief Complaint  Patient presents with  . Abdominal Pain  . Emesis       Brief Narrative    Susan Horton is a 49 y.o. female with medical history significant of ESRD on peritoneal dialysis, insulin-dependent type 2 diabetes, hypertension, CHF, PVD, CAD status post PCI, GERD, gastroparesis, history of cholecystectomy, history of sacral abscess presenting to the ED for evaluation of abdominal pain and emesis.  Patient reports 4-day history of nonbloody, nonbilious emesis.  She has not been able to tolerate p.o. intake.  Also having epigastric abdominal pain.  States nausea/vomiting is a chronic problem for her due to diabetic gastroparesis but it is much worse now.  Denies fevers, chills, or diarrhea.  No recent sick contacts.  Denies cough, shortness of breath, or chest pain.  ED Course: Afebrile.  Slightly tachycardic.  Blood pressure elevated.  Not tachypneic or hypoxic.  Labs showing no leukocytosis.  Hemoglobin 10.9, at baseline.  Potassium 5.4.  Bicarb 20, anion gap 20.  BUN 78, creatinine 15.5.  Baseline creatinine in the 9-10 range.  Lipase and LFTs normal.  Beta-hCG negative.  UA pending.  Peritoneal fluid labs sent out for testing to rule out peritonitis.  SARS-CoV-2 PCR test pending.  Influenza panel pending. Chest x-ray showing no active cardiopulmonary disease. CT abdomen pelvis without acute intra-abdominal pathology.  Showing patchy foci of groundglass density and consolidation at the peripheral  lung bases and lingula suspicious for multifocal pneumonia/atypical or viral infection.  Also showing deep wound superficial to the sacrococcygeal region with soft tissue thickening and small gas locules with probable small amount of surgical change in the region.  No definite osseous destruction of the underlying sacrococcygeal bone to suggest osteomyelitis   Subjective:    Susan Horton today still reports some nausea, no vomiting today, but unable to have any  oral intake giving her nausea, still reports some abdominal pain, denies any dyspnea .   Assessment  & Plan :    Principal Problem:   Intractable nausea and vomiting Active Problems:   Gastroparesis   Essential hypertension, benign   Epigastric abdominal pain   Multifocal pneumonia   Epigastric abdominal pain, intractable nausea and vomiting  - suspect due to diabetic gastroparesis, possibly related to Covid gastroenteritis, but this appears to be overall chronic problem with acute exacerbation. CT abdomen pelvis without acute intra-abdominal pathology.  Lipase and LFTs normal. -Remains with significant nausea, very poor appetite almost no oral intake, continue with gentle hydration. -Continue with scheduled Reglan. -Zofran as needed for nausea/vomiting -Avoid narcotics at might worsen her gastroparesis -Continue PPI for GERD -Peritoneal fluid labs sent out for testing to rule out peritonitis.  Although low suspicion given no fever or leukocytosis.  COVID-19 pneumonia -Measuring significant for multifocal opacity(CT abdomen pelvis). -Continue with IV steroids and remdesivir. -Continue to trend inflammatory markers.  ESRD on PD -Potassium 5.7 this morning, will give Lokelma, continue with renal diet, renal consulted to resume PD this evening.  High anion gap metabolic acidosis Likely related to ESRD. Bicarb 20, anion gap 20.    -Consult nephrology in a.m. as mentioned above  History of sacral abscess Patient was  recently admitted in 12/29-1/3 for a sacral abscess.  Status post I&D on 12/31.  Infectious disease was consulted and recommended 14-day course of linezolid.  CT done today showing deep wound superficial to the sacrococcygeal region with soft tissue thickening and small gas locules with probable small amount of surgical change in the region.  No definite osseous destruction of the underlying sacrococcygeal bone to suggest osteomyelitis. -Wound care -Patient is followed by infectious disease, ensure follow-up.  Insulin-dependent type 2 diabetes -Having poor appetite will decrease glargine to 5 units twice daily -Sliding scale insulin very sensitive before meals and CBG checks  Hypertension Blood pressure elevated. -Continue home Imdur, metoprolol -Hydralazine as needed for SBP >170  COVID-19 Labs  Recent Labs    02/25/19 0638  DDIMER 1.62*  FERRITIN 2,091*  LDH 163  CRP 2.9*    Lab Results  Component Value Date   SARSCOV2NAA POSITIVE (A) 02/24/2019   [place] NEGATIVE 01/10/2019   [place] NEGATIVE 12/25/2018   [place] NEGATIVE 12/12/2018     Code Status : Full  Family Communication  : patient Is awake alert coherent, discussed with the patient  Disposition Plan  : home  Barriers For Discharge : still Significant nausea, almost no oral intake today  Consults  :  renal  Procedures  : None  DVT Prophylaxis  :  [place] heparin  Lab Results  Component Value Date   PLT 439 (H) 02/24/2019    Antibiotics  :    Anti-infectives (From admission, onward)   Start     Dose/Rate Route Frequency Ordered Stop   02/26/19 1000  remdesivir 100 mg in sodium chloride 0.9 % 100 mL IVPB     100 mg 200 mL/hr over 30 Minutes Intravenous Daily 02/25/19 0031 03/02/19 0959   02/25/19 0200  remdesivir 200 mg in sodium chloride 0.9% 250 mL IVPB     200 mg 580 mL/hr over 30 Minutes Intravenous Once 02/25/19 0031 02/25/19 0358        Objective:   Vitals:   02/25/19 0111  02/25/19 0234 02/25/19 0830 02/25/19 0850  BP: (!) 180/82 (!) 141/73 (!) 146/69 (!) 146/69  Pulse: (!) 109   93  Resp: (!) 22   14  Temp: 97.6 F (36.4 C)  99.3 F (37.4 C) 99.3 F (37.4 C)  TempSrc: Oral  Oral Oral  SpO2: 100%  100% 100%  Weight: 64.7 kg     Height: 5\' 5"  (1.651 m)       Wt Readings from Last 3 Encounters:  02/25/19 64.7 kg  02/05/19 66.9 kg  01/26/19 69.4 kg     Intake/Output Summary (Last 24 hours) at 02/25/2019 1447 Last data filed at 02/25/2019 0400 Gross per 24 hour  Intake 240 ml  Output --  Net 240 ml     Physical Exam  Awake Alert, Oriented X 3, No new F.N deficits, Normal affect Symmetrical Chest wall movement, Good air movement bilaterally, CTAB RRR,No Gallops,Rubs or new Murmurs, No Parasternal Heave +ve B.Sounds, Abd Soft, No tenderness,  No rebound - guarding or rigidity. No Cyanosis, Clubbing or edema, No new Rash or bruise      Data Review:    CBC Recent Labs  Lab 02/24/19 1622  WBC 7.4  HGB 10.9*  HCT 36.2  PLT 439*  MCV 86.4  MCH 26.0  MCHC 30.1  RDW 15.1    Chemistries  Recent Labs  Lab 02/24/19 1622 02/25/19 0410  NA 141 138  K 5.4* 5.7*  CL 101 102  CO2 20* 20*  GLUCOSE 74 168*  BUN 78* 76*  CREATININE 15.56* 14.75*  CALCIUM 8.9 8.0*  AST 16  --   ALT 16  --   ALKPHOS 93  --   BILITOT 1.1  --    ------------------------------------------------------------------------------------------------------------------ No results for input(s): CHOL, HDL, LDLCALC, TRIG, CHOLHDL, LDLDIRECT in the last 72 hours.  Lab Results  Component Value Date   HGBA1C 7.5 (H) 12/26/2018   ------------------------------------------------------------------------------------------------------------------ No results for input(s): TSH, T4TOTAL, T3FREE, THYROIDAB in the last 72 hours.  Invalid input(s):  FREET3 ------------------------------------------------------------------------------------------------------------------ Recent Labs    02/25/19 0638  FERRITIN 2,091*    Coagulation profile No results for input(s): INR, PROTIME in the last 168 hours.  Recent Labs    02/25/19 0638  DDIMER 1.62*    Cardiac Enzymes No results for input(s): CKMB, TROPONINI, MYOGLOBIN in the last 168 hours.  Invalid input(s): CK ------------------------------------------------------------------------------------------------------------------    Component Value Date/Time   BNP 29.5 07/06/2016 0058    Inpatient Medications  Scheduled Meds: . atorvastatin  40 mg Oral Daily  . calcitRIOL  1.5 mcg Oral Daily  . calcium acetate  2,001 mg Oral TID WC  . clopidogrel  75 mg Oral Q breakfast  . dexamethasone (DECADRON) injection  6 mg Intravenous Q24H  . feeding supplement (ENSURE ENLIVE)  237 mL Oral BID BM  . ferrous sulfate  325 mg Oral Q breakfast  . heparin  5,000 Units Subcutaneous Q8H  . insulin aspart  0-6 Units Subcutaneous TID WC  . insulin glargine  10 Units Subcutaneous BID  . isosorbide mononitrate  15 mg Oral Daily  . metoCLOPramide (REGLAN) injection  10 mg Intravenous Q8H  . metoprolol succinate  50 mg Oral Daily  . mometasone-formoterol  2 puff Inhalation BID  . pantoprazole (PROTONIX) IV  40 mg Intravenous Daily  . sodium chloride flush  3 mL Intravenous Once   Continuous Infusions: . [START ON 02/26/2019] remdesivir 100 mg in NS 100 mL     PRN Meds:.acetaminophen **OR** acetaminophen, albuterol, calcium acetate, hydrALAZINE, montelukast, morphine injection, ondansetron (ZOFRAN) IV  Micro Results Recent Results (from the past 240 hour(s))  Respiratory Panel by RT  PCR (Flu A&B, Covid) - Nasopharyngeal Swab     Status: Abnormal   Collection Time: 02/24/19  8:58 PM   Specimen: Nasopharyngeal Swab  Result Value Ref Range Status   SARS Coronavirus 2 by RT PCR POSITIVE (A)  NEGATIVE Final    Comment: RESULT CALLED TO, READ BACK BY AND VERIFIED WITH: A. CAIN,RN 0018 02/25/2019 T. TYSOR (NOTE) SARS-CoV-2 target nucleic acids are DETECTED. SARS-CoV-2 RNA is generally detectable in upper respiratory specimens  during the acute phase of infection. Positive results are indicative of the presence of the identified virus, but do not rule out bacterial infection or co-infection with other pathogens not detected by the test. Clinical correlation with patient history and other diagnostic information is necessary to determine patient infection status. The expected result is Negative. Fact Sheet for Patients:  PinkCheek.be Fact Sheet for Healthcare Providers: GravelBags.it This test is not yet approved or cleared by the [place] FDA and  has been authorized for detection and/or diagnosis of SARS-CoV-2 by FDA under an Emergency Use Authorization (EUA).  This EUA will remain in effect (meaning this test can be used) f or the duration of  the COVID-19 declaration under Section 564(b)(1) of the Act, 21 U.S.C. section 360bbb-3(b)(1), unless the authorization is terminated or revoked sooner.    Influenza A by PCR NEGATIVE NEGATIVE Final   Influenza B by PCR NEGATIVE NEGATIVE Final    Comment: (NOTE) The Xpert Xpress SARS-CoV-2/FLU/RSV assay is intended as an aid in  the diagnosis of influenza from Nasopharyngeal swab specimens and  should not be used as a sole basis for treatment. Nasal washings and  aspirates are unacceptable for Xpert Xpress SARS-CoV-2/FLU/RSV  testing. Fact Sheet for Patients: PinkCheek.be Fact Sheet for Healthcare Providers: GravelBags.it This test is not yet approved or cleared by the [place] FDA and  has been authorized for detection and/or diagnosis of SARS-CoV-2 by  FDA under an Emergency Use Authorization (EUA). This  EUA will remain  in effect (meaning this test can be used) for the duration of the  Covid-19 declaration under Section 564(b)(1) of the Act, 21  U.S.C. section 360bbb-3(b)(1), unless the authorization is  terminated or revoked. Performed at [facility], [place] [address] [place], [place] [address]     Radiology Reports CT ABDOMEN PELVIS WO CONTRAST  Result Date: 02/24/2019 CLINICAL DATA:  Generalized abdominal pain with vomiting EXAM: CT ABDOMEN AND PELVIS WITHOUT CONTRAST TECHNIQUE: Multidetector CT imaging of the abdomen and pelvis was performed following the standard protocol without IV contrast. COMPARISON:  CT 12/27/2018, 12/25/2018, MRI 01/10/2019 FINDINGS: Lower chest: Lung bases demonstrate scattered foci of ground-glass density and patchy consolidation mostly at the periphery of the lingula and left greater than right lung base. No pleural effusion. Heart size within normal limits. Hepatobiliary: No focal liver abnormality is seen. Status post cholecystectomy. No biliary dilatation. Pancreas: Unremarkable. No pancreatic ductal dilatation or surrounding inflammatory changes. Spleen: Normal in size without focal abnormality. Adrenals/Urinary Tract: Adrenal glands are normal. Nonspecific perinephric fat stranding. No hydronephrosis. Stomach/Bowel: Stomach is nonenlarged. No dilated small bowel. History of prior appendectomy. No bowel wall thickening. Vascular/Lymphatic: Extensive atherosclerosis. No aneurysmal dilatation. Subcentimeter retroperitoneal nodes. Reproductive: Status post hysterectomy. No adnexal masses. Other: Negative for free air or free fluid. Peritoneal dialysis catheter in the left lower quadrant. Musculoskeletal: No acute osseous abnormality. Deep wound with soft tissue thickening and small gas pockets superficial to the sacrococcygeal region at the site of prior rim enhancing fluid collection. IMPRESSION: 1. Patchy  foci of ground-glass density and consolidation at the  peripheral lung bases and lingula suspect for multifocal infection/possible atypical or viral pneumonia. 2. Deep wound superficial to the sacrococcygeal region with soft tissue thickening and small gas locules with probable small amount of surgical change in the region. No definitive osseous destruction of the underlying sacrococcygeal bone to suggest osteomyelitis by CT. Electronically Signed   By: Donavan Foil M.D.   On: 02/24/2019 19:25   DG Chest Portable 1 View  Result Date: 02/24/2019 CLINICAL DATA:  Shortness of breath, abdominal pain, vomiting EXAM: PORTABLE CHEST 1 VIEW COMPARISON:  09/16/2018 FINDINGS: Lungs are clear.  No pleural effusion or pneumothorax. The heart is normal in size. IMPRESSION: No evidence of acute cardiopulmonary disease. Electronically Signed   By: Julian Hy M.D.   On: 02/24/2019 21:06      Phillips Climes M.D on 02/25/2019 at 2:47 PM  Between 7am to 7pm - Pager - [phone]  After 7pm go to [email or web address] - password [email or web address]  [facility] -  Office  [phone]

## 2019-02-25 NOTE — Progress Notes (Signed)
Hypoglycemic Event  CBG: 64  Treatment: 4 oz juice  Symptoms: none  Follow-up CBG: Time:1637 CBG Result:67  Treatment: 4 oz juice  Symptoms: none  Follow-up CBG: Time:1651 CBG Result:64  Treatment: dextrose 50% 25 ml  Symptoms: none  Follow-up CBG: Time:1710 CBG Result:302  Possible Reasons for Event:lantus  Comments/MD notified:Elgergawy MD notified    Henrene Hawking

## 2019-02-26 DIAGNOSIS — R1013 Epigastric pain: Secondary | ICD-10-CM

## 2019-02-26 LAB — RENAL FUNCTION PANEL
Albumin: 2.3 g/dL — ABNORMAL LOW (ref 3.5–5.0)
Anion gap: 19 — ABNORMAL HIGH (ref 5–15)
BUN: 67 mg/dL — ABNORMAL HIGH (ref 6–20)
CO2: 17 mmol/L — ABNORMAL LOW (ref 22–32)
Calcium: 8.8 mg/dL — ABNORMAL LOW (ref 8.9–10.3)
Chloride: 102 mmol/L (ref 98–111)
Creatinine, Ser: 12.6 mg/dL — ABNORMAL HIGH (ref 0.44–1.00)
GFR calc Af Amer: 4 mL/min — ABNORMAL LOW (ref >60)
GFR calc non Af Amer: 3 mL/min — ABNORMAL LOW (ref >60)
Glucose, Bld: 82 mg/dL (ref 70–99)
Phosphorus: 9.7 mg/dL — ABNORMAL HIGH (ref 2.5–4.6)
Potassium: 5.4 mmol/L — ABNORMAL HIGH (ref 3.5–5.1)
Sodium: 138 mmol/L (ref 135–145)

## 2019-02-26 LAB — CBC
HCT: 30.9 % — ABNORMAL LOW (ref 36.0–46.0)
Hemoglobin: 9.6 g/dL — ABNORMAL LOW (ref 12.0–15.0)
MCH: 26 pg (ref 26.0–34.0)
MCHC: 31.1 g/dL (ref 30.0–36.0)
MCV: 83.7 fL (ref 80.0–100.0)
Platelets: 348 10*3/uL (ref 150–400)
RBC: 3.69 MIL/uL — ABNORMAL LOW (ref 3.87–5.11)
RDW: 14.9 % (ref 11.5–15.5)
WBC: 3.2 10*3/uL — ABNORMAL LOW (ref 4.0–10.5)
nRBC: 0 % (ref 0.0–0.2)

## 2019-02-26 LAB — GLUCOSE, CAPILLARY
Glucose-Capillary: 127 mg/dL — ABNORMAL HIGH (ref 70–99)
Glucose-Capillary: 148 mg/dL — ABNORMAL HIGH (ref 70–99)
Glucose-Capillary: 115 mg/dL — ABNORMAL HIGH (ref 70–99)
Glucose-Capillary: 74 mg/dL (ref 70–99)
Glucose-Capillary: 85 mg/dL (ref 70–99)
Glucose-Capillary: 69 mg/dL — ABNORMAL LOW (ref 70–99)

## 2019-02-26 MED ORDER — PRO-STAT SUGAR FREE PO LIQD
30.0000 mL | Freq: Two times a day (BID) | ORAL | Status: DC
  Administered 2019-02-27: 30 mL via ORAL
  Filled 2019-02-26 (×5): qty 30

## 2019-02-26 MED ORDER — ISOSORBIDE MONONITRATE ER 30 MG PO TB24
30.0000 mg | ORAL_TABLET | Freq: Every day | ORAL | Status: DC
  Administered 2019-02-27 – 2019-03-02 (×4): 30 mg via ORAL
  Filled 2019-02-26 (×4): qty 1

## 2019-02-26 MED ORDER — HYDROCODONE-ACETAMINOPHEN 5-325 MG PO TABS
1.0000 | ORAL_TABLET | Freq: Four times a day (QID) | ORAL | Status: DC | PRN
  Administered 2019-02-26 – 2019-02-28 (×5): 1 via ORAL
  Filled 2019-02-26 (×6): qty 1

## 2019-02-26 MED ORDER — SODIUM ZIRCONIUM CYCLOSILICATE 10 G PO PACK
10.0000 g | PACK | Freq: Once | ORAL | Status: AC
  Administered 2019-02-26: 10 g via ORAL
  Filled 2019-02-26: qty 1

## 2019-02-26 MED ORDER — NEPRO/CARBSTEADY PO LIQD: 237.0000 mL | Freq: Three times a day (TID) | ORAL | Status: DC

## 2019-02-26 NOTE — Progress Notes (Signed)
PROGRESS NOTE                                                                                                                                                                                                             Patient Demographics:    Susan Horton, is a 49 y.o. female, DOB - April 18, 1970, JSE:[id number]  Admit date - 02/24/2019   Admitting Physician Shela Leff, MD  Outpatient Primary MD for the patient is Charlott Rakes, MD  LOS - 2   Chief Complaint  Patient presents with  . Abdominal Pain  . Emesis       Brief Narrative    Susan Horton is a 49 y.o. female with medical history significant of ESRD on peritoneal dialysis, insulin-dependent type 2 diabetes, hypertension, CHF, PVD, CAD status post PCI, GERD, gastroparesis, history of cholecystectomy, history of sacral abscess presenting to the ED for evaluation of abdominal pain and emesis.  Patient reports 4-day history of nonbloody, nonbilious emesis.  She has not been able to tolerate p.o. intake.  Also having epigastric abdominal pain.  States nausea/vomiting is a chronic problem for her due to diabetic gastroparesis but it is much worse now.  Denies fevers, chills, or diarrhea.  No recent sick contacts.  Denies cough, shortness of breath, or chest pain.  As well patient tested positive for COVID-19, patient with recent hospitalization secondary to sacral abscess, wound has been good, currently finished antibiotic, wound is improving with wound care, CT abdomen pelvis without acute intra-abdominal pathology.     Subjective:    Susan Horton today still reports some nausea, reports some vomiting overnight, complains of abdominal pain today    Assessment  & Plan :    Principal Problem:   Intractable nausea and vomiting Active Problems:   Gastroparesis   Essential hypertension, benign   Epigastric abdominal pain   Multifocal pneumonia   Epigastric abdominal pain, intractable nausea  and vomiting  - suspect due to diabetic gastroparesis, possibly related to Covid gastroenteritis as well , but this appears to be overall chronic problem with acute exacerbation. CT abdomen pelvis without acute intra-abdominal pathology.  Lipase and LFTs normal. -Remains with significant nausea, does report some vomiting overnight, continue gentle hydration . -Continue with scheduled Reglan. -Zofran as needed for nausea/vomiting -Avoid narcotics at might worsen her gastroparesis, will complains of abdominal  pain, requests some pain regimen temporarily, will start on Vicodin for the next 24 hours as needed. -Continue PPI for GERD -Peritoneal fluid labs sent out for testing to rule out peritonitis.  Although low suspicion given no fever or leukocytosis.  COVID-19 pneumonia -Measuring significant for multifocal opacity(CT abdomen pelvis). -Continue with IV steroids and remdesivir. -Continue to trend inflammatory markers.  ESRD on PD -Potassium 5.7 this morning, will give another dose of Lokelma, continue with renal diet, continue with PD, managed by renal .  High anion gap metabolic acidosis Likely related to ESRD. Bicarb 20, anion gap 20.    -Consult nephrology in a.m. as mentioned above  History of sacral abscess Patient was recently admitted in 12/29-1/3 for a sacral abscess.  Status post I&D on 12/31.  Infectious disease was consulted and recommended 14-day course of linezolid.  CT  showing deep wound superficial to the sacrococcygeal region with soft tissue thickening and small gas locules with probable small amount of surgical change in the region.  No definite osseous destruction of the underlying sacrococcygeal bone to suggest osteomyelitis. -Wound care consult greatly appreciated, wound with no foul-smelling odor or discharge -Patient is followed by infectious disease, ensure follow-up.  Insulin-dependent type 2 diabetes -Having poor appetite , and couple episodes of low CBGs,  so I have stopped long-acting insulin. -Sliding scale insulin very sensitive before meals and CBG checks  Hypertension -Continue home Imdur, metoprolol, blood pressure elevated, so we will increase Imdur from 15 to 30 mg daily -Hydralazine as needed for SBP >170  COVID-19 Labs  Recent Labs    02/25/19 0638  DDIMER 1.62*  FERRITIN 2,091*  LDH 163  CRP 2.9*    Lab Results  Component Value Date   SARSCOV2NAA POSITIVE (A) 02/24/2019   [place] NEGATIVE 01/10/2019   [place] NEGATIVE 12/25/2018   [place] NEGATIVE 12/12/2018     Code Status : Full  Family Communication  : patient Is awake alert coherent, discussed with the patient  Disposition Plan  : home  Barriers For Discharge : still Significant nausea, almost no oral intake today  Consults  :  renal  Procedures  : None  DVT Prophylaxis  :  [place] heparin  Lab Results  Component Value Date   PLT 439 (H) 02/24/2019    Antibiotics  :    Anti-infectives (From admission, onward)   Start     Dose/Rate Route Frequency Ordered Stop   02/26/19 1000  remdesivir 100 mg in sodium chloride 0.9 % 100 mL IVPB     100 mg 200 mL/hr over 30 Minutes Intravenous Daily 02/25/19 0031 03/02/19 0959   02/25/19 0200  remdesivir 200 mg in sodium chloride 0.9% 250 mL IVPB     200 mg 580 mL/hr over 30 Minutes Intravenous Once 02/25/19 0031 02/25/19 1900        Objective:   Vitals:   02/25/19 2010 02/26/19 0453 02/26/19 0533 02/26/19 0922  BP: (!) 152/72 (!) 186/82 (!) 170/73 (!) 156/68  Pulse: 88 99 92 85  Resp:      Temp: 98.4 F (36.9 C) 98.4 F (36.9 C)    TempSrc: Oral Oral    SpO2: 100% 100%    Weight: 65 kg     Height:        Wt Readings from Last 3 Encounters:  02/25/19 65 kg  02/05/19 66.9 kg  01/26/19 69.4 kg     Intake/Output Summary (Last 24 hours) at 02/26/2019 1129 Last data filed at 02/26/2019 0900 Gross  per 24 hour  Intake 11019 ml  Output 11341 ml  Net -322 ml     Physical  Exam  Awake Alert, Oriented X 3, No new F.N deficits, Normal affect Symmetrical Chest wall movement, Good air movement bilaterally, CTAB RRR,No Gallops,Rubs or new Murmurs, No Parasternal Heave +ve B.Sounds, Abd Soft, No tenderness, No rebound - guarding or rigidity. No Cyanosis, Clubbing or edema, No new Rash or bruise       Data Review:    CBC Recent Labs  Lab 02/24/19 1622  WBC 7.4  HGB 10.9*  HCT 36.2  PLT 439*  MCV 86.4  MCH 26.0  MCHC 30.1  RDW 15.1    Chemistries  Recent Labs  Lab 02/24/19 1622 02/25/19 0410  NA 141 138  K 5.4* 5.7*  CL 101 102  CO2 20* 20*  GLUCOSE 74 168*  BUN 78* 76*  CREATININE 15.56* 14.75*  CALCIUM 8.9 8.0*  AST 16  --   ALT 16  --   ALKPHOS 93  --   BILITOT 1.1  --    ------------------------------------------------------------------------------------------------------------------ No results for input(s): CHOL, HDL, LDLCALC, TRIG, CHOLHDL, LDLDIRECT in the last 72 hours.  Lab Results  Component Value Date   HGBA1C 7.5 (H) 12/26/2018   ------------------------------------------------------------------------------------------------------------------ No results for input(s): TSH, T4TOTAL, T3FREE, THYROIDAB in the last 72 hours.  Invalid input(s): FREET3 ------------------------------------------------------------------------------------------------------------------ Recent Labs    02/25/19 0638  FERRITIN 2,091*    Coagulation profile No results for input(s): INR, PROTIME in the last 168 hours.  Recent Labs    02/25/19 0638  DDIMER 1.62*    Cardiac Enzymes No results for input(s): CKMB, TROPONINI, MYOGLOBIN in the last 168 hours.  Invalid input(s): CK ------------------------------------------------------------------------------------------------------------------    Component Value Date/Time   BNP 29.5 07/06/2016 0058    Inpatient Medications  Scheduled Meds: . atorvastatin  40 mg Oral Daily  .  calcitRIOL  1.5 mcg Oral Daily  . calcium acetate  2,001 mg Oral TID WC  . clopidogrel  75 mg Oral Q breakfast  . dexamethasone (DECADRON) injection  6 mg Intravenous Q24H  . dextrose  1 Tube Oral STAT  . feeding supplement (ENSURE ENLIVE)  237 mL Oral BID BM  . ferrous sulfate  325 mg Oral Q breakfast  . gentamicin cream  1 application Topical Daily  . heparin  5,000 Units Subcutaneous Q8H  . insulin aspart  0-6 Units Subcutaneous TID WC  . isosorbide mononitrate  15 mg Oral Daily  . metoCLOPramide (REGLAN) injection  5 mg Intravenous Q8H  . metoprolol succinate  50 mg Oral Daily  . mometasone-formoterol  2 puff Inhalation BID  . pantoprazole (PROTONIX) IV  40 mg Intravenous Daily  . sodium chloride flush  3 mL Intravenous Once   Continuous Infusions: . dialysis solution 1.5% low-MG/low-CA    . dialysis solution 2.5% low-MG/low-CA    . remdesivir 100 mg in NS 100 mL 100 mg (02/26/19 0938)   PRN Meds:.acetaminophen **OR** acetaminophen, albuterol, calcium acetate, dianeal solution for CAPD/CCPD with heparin, dianeal solution for CAPD/CCPD with heparin, hydrALAZINE, HYDROcodone-acetaminophen, montelukast, ondansetron (ZOFRAN) IV  Micro Results Recent Results (from the past 240 hour(s))  Respiratory Panel by RT PCR (Flu A&B, Covid) - Nasopharyngeal Swab     Status: Abnormal   Collection Time: 02/24/19  8:58 PM   Specimen: Nasopharyngeal Swab  Result Value Ref Range Status   SARS Coronavirus 2 by RT PCR POSITIVE (A) NEGATIVE Final    Comment: RESULT CALLED TO, READ BACK  BY AND VERIFIED WITH: A. CAIN,RN 0018 02/25/2019 T. TYSOR (NOTE) SARS-CoV-2 target nucleic acids are DETECTED. SARS-CoV-2 RNA is generally detectable in upper respiratory specimens  during the acute phase of infection. Positive results are indicative of the presence of the identified virus, but do not rule out bacterial infection or co-infection with other pathogens not detected by the test. Clinical correlation  with patient history and other diagnostic information is necessary to determine patient infection status. The expected result is Negative. Fact Sheet for Patients:  PinkCheek.be Fact Sheet for Healthcare Providers: GravelBags.it This test is not yet approved or cleared by the [place] FDA and  has been authorized for detection and/or diagnosis of SARS-CoV-2 by FDA under an Emergency Use Authorization (EUA).  This EUA will remain in effect (meaning this test can be used) f or the duration of  the COVID-19 declaration under Section 564(b)(1) of the Act, 21 U.S.C. section 360bbb-3(b)(1), unless the authorization is terminated or revoked sooner.    Influenza A by PCR NEGATIVE NEGATIVE Final   Influenza B by PCR NEGATIVE NEGATIVE Final    Comment: (NOTE) The Xpert Xpress SARS-CoV-2/FLU/RSV assay is intended as an aid in  the diagnosis of influenza from Nasopharyngeal swab specimens and  should not be used as a sole basis for treatment. Nasal washings and  aspirates are unacceptable for Xpert Xpress SARS-CoV-2/FLU/RSV  testing. Fact Sheet for Patients: PinkCheek.be Fact Sheet for Healthcare Providers: GravelBags.it This test is not yet approved or cleared by the [place] FDA and  has been authorized for detection and/or diagnosis of SARS-CoV-2 by  FDA under an Emergency Use Authorization (EUA). This EUA will remain  in effect (meaning this test can be used) for the duration of the  Covid-19 declaration under Section 564(b)(1) of the Act, 21  U.S.C. section 360bbb-3(b)(1), unless the authorization is  terminated or revoked. Performed at [facility], [place] [address] [place], [place] [address]     Radiology Reports CT ABDOMEN PELVIS WO CONTRAST  Result Date: 02/24/2019 CLINICAL DATA:  Generalized abdominal pain with vomiting EXAM: CT ABDOMEN AND PELVIS  WITHOUT CONTRAST TECHNIQUE: Multidetector CT imaging of the abdomen and pelvis was performed following the standard protocol without IV contrast. COMPARISON:  CT 12/27/2018, 12/25/2018, MRI 01/10/2019 FINDINGS: Lower chest: Lung bases demonstrate scattered foci of ground-glass density and patchy consolidation mostly at the periphery of the lingula and left greater than right lung base. No pleural effusion. Heart size within normal limits. Hepatobiliary: No focal liver abnormality is seen. Status post cholecystectomy. No biliary dilatation. Pancreas: Unremarkable. No pancreatic ductal dilatation or surrounding inflammatory changes. Spleen: Normal in size without focal abnormality. Adrenals/Urinary Tract: Adrenal glands are normal. Nonspecific perinephric fat stranding. No hydronephrosis. Stomach/Bowel: Stomach is nonenlarged. No dilated small bowel. History of prior appendectomy. No bowel wall thickening. Vascular/Lymphatic: Extensive atherosclerosis. No aneurysmal dilatation. Subcentimeter retroperitoneal nodes. Reproductive: Status post hysterectomy. No adnexal masses. Other: Negative for free air or free fluid. Peritoneal dialysis catheter in the left lower quadrant. Musculoskeletal: No acute osseous abnormality. Deep wound with soft tissue thickening and small gas pockets superficial to the sacrococcygeal region at the site of prior rim enhancing fluid collection. IMPRESSION: 1. Patchy foci of ground-glass density and consolidation at the peripheral lung bases and lingula suspect for multifocal infection/possible atypical or viral pneumonia. 2. Deep wound superficial to the sacrococcygeal region with soft tissue thickening and small gas locules with probable small amount of surgical change in the region. No definitive osseous destruction of the  underlying sacrococcygeal bone to suggest osteomyelitis by CT. Electronically Signed   By: Donavan Foil M.D.   On: 02/24/2019 19:25   DG Chest Portable 1 View  Result  Date: 02/24/2019 CLINICAL DATA:  Shortness of breath, abdominal pain, vomiting EXAM: PORTABLE CHEST 1 VIEW COMPARISON:  09/16/2018 FINDINGS: Lungs are clear.  No pleural effusion or pneumothorax. The heart is normal in size. IMPRESSION: No evidence of acute cardiopulmonary disease. Electronically Signed   By: Julian Hy M.D.   On: 02/24/2019 21:06      Phillips Climes M.D on 02/26/2019 at 11:29 AM  Between 7am to 7pm - Pager - [phone]  After 7pm go to [email or web address] - password [facility]  [facility] -  Office  [phone]

## 2019-02-26 NOTE — Progress Notes (Signed)
  [facility] Progress Note   Assessment/ Plan:   Outpt PD: PD orders CCPD, 7X Week, EDW 75.5 (kg) Exchanges 5, fill vol 2000 mL, Dwell Time 1 hrs 30 min, last fill vol 1000 mL, Day Exchanges 0, Day Dwell Time 0 hrs 0 min     Assessment/ Plan: 1. Nausea/ vomiting - per primary, poss d/t COVID infection, also h/o gastroparesis. No abd pain. Sending fluid for cell Ct and culture to rule out peritonitis- lower suspicion 2. ESRD - on PD. Plan PD nightly while here. Hav increased from 4 to 5 fills overnight while here, no day bag.  3. Hyperkalemia - mild, give lokelma x 1, renal diet, PD 4. COVID+ PNA - w/ some GG changes on CT, getting remdesivir, decadron, no IV abx 5. DM on insulin 6. HTN/vol - bp's up a little, cont metop as at home. Euvolemic on exam. Continue mix of 1.5 and 2.5% fluids 7. CAD h/o stent  Subjective:    Had drain pain overnight, still with abd pain/ n/v.     Objective:   BP (!) 181/85   Pulse 93   Temp 98.5 F (36.9 C) (Oral)   Resp 14   Ht 5\' 5"  (1.651 m)   Wt 65 kg   SpO2 100%   BMI 23.85 kg/m   Physical Exam: Gen: sitting in bed, curled up  CVS: RRR Resp: clear bilaterally no c/w/r Abd: soft, mildly diffusely tender to palpation, PD cath in place Ext: no LE edema  Labs: BMET Recent Labs  Lab 02/24/19 1622 02/25/19 0410  NA 141 138  K 5.4* 5.7*  CL 101 102  CO2 20* 20*  GLUCOSE 74 168*  BUN 78* 76*  CREATININE 15.56* 14.75*  CALCIUM 8.9 8.0*   CBC Recent Labs  Lab 02/24/19 1622 02/26/19 1109  WBC 7.4 3.2*  HGB 10.9* 9.6*  HCT 36.2 30.9*  MCV 86.4 83.7  PLT 439* 348      Medications:    . atorvastatin  40 mg Oral Daily  . calcitRIOL  1.5 mcg Oral Daily  . calcium acetate  2,001 mg Oral TID WC  . clopidogrel  75 mg Oral Q breakfast  . dexamethasone (DECADRON) injection  6 mg Intravenous Q24H  . dextrose  1 Tube Oral STAT  . feeding supplement (ENSURE ENLIVE)  237 mL Oral BID BM  . ferrous sulfate  325 mg  Oral Q breakfast  . gentamicin cream  1 application Topical Daily  . heparin  5,000 Units Subcutaneous Q8H  . insulin aspart  0-6 Units Subcutaneous TID WC  . [START ON 02/27/2019] isosorbide mononitrate  30 mg Oral Daily  . metoCLOPramide (REGLAN) injection  5 mg Intravenous Q8H  . metoprolol succinate  50 mg Oral Daily  . mometasone-formoterol  2 puff Inhalation BID  . pantoprazole (PROTONIX) IV  40 mg Intravenous Daily  . sodium chloride flush  3 mL Intravenous Once  . sodium zirconium cyclosilicate  10 g Oral Once     Madelon Lips, MD 02/26/2019, 12:04 PM

## 2019-02-26 NOTE — Plan of Care (Signed)
  Problem: Education: Goal: Knowledge of General Education information will improve Description: Including pain rating scale, medication(s)/side effects and non-pharmacologic comfort measures Outcome: Progressing   Problem: Clinical Measurements: Goal: Will remain free from infection Outcome: Progressing   

## 2019-02-26 NOTE — Progress Notes (Signed)
Arrived to patient room to put on PD. Patient declined due to not feeling well advised need a cell culture to determine whether a problem. Patient still declined. Alerted floor RN Jacqulyn Bath and alerted MD Carolin Sicks.

## 2019-02-26 NOTE — Consult Note (Addendum)
[facility] Nurse Consult Note: Reason for Consult: Consult requested for sacrum wound.  Pt had I&D performed by the surgical term for an abscess in Dec and is followed by them as an outpatient. Pt is in isolation for Covid.  Wound type: Full thickness post-op wound to sacrum; NOT a pressure injury Measurement: 1X.3X1.2cm, tunneling at top and bottom wound edges to 1 cm when probed with a swab. Wound bed: beefy red and moist, small amt yellow drainage, no odor. Periwound: Intact skin surrounding Dressing procedure/placement/frequency: Continue present plan of care as ordered by the surgical team prior to admission: Topical treatment orders provided for bedside nurses to perform daily as follows: Pack sacrum wound with saline-moistened packing strip (left at bedside) using swab to fill BID, then cover with 2X2 gauze and Tegaderm. Please re-consult if further assistance is needed.  Thank-you,  Julien Girt MSN, [place], [place], [place], [place]

## 2019-02-26 NOTE — Progress Notes (Signed)
Initial Nutrition Assessment   RD working remotely.  DOCUMENTATION CODES:   Not applicable  INTERVENTION:  Provide Nepro Shake po TID, each supplement provides 425 kcal and 19 grams protein.  Provide 30 ml Prostat po BID, each supplement provides 100 kcal and 15 grams of protein.   Encourage adequate PO intake.   NUTRITION DIAGNOSIS:   Increased nutrient needs related to chronic illness(ESRD PD, CHF) as evidenced by estimated needs.  GOAL:   Patient will meet greater than or equal to 90% of their needs  MONITOR:   PO intake, Supplement acceptance, Skin, Weight trends, Labs, I & O's  REASON FOR ASSESSMENT:   Malnutrition Screening Tool    ASSESSMENT:   49 y.o. female with medical history significant of ESRD on peritoneal dialysis, insulin-dependent type 2 diabetes, hypertension, CHF, PVD, CAD status post PCI, GERD, gastroparesis, history of cholecystectomy, history of sacral abscess presenting to the ED for evaluation of abdominal pain and emesis.  Patient reports 4-day history of nonbloody, nonbilious emesis. COVID positive.  Pt unavailable during attempted time of contact. RD unable to obtain pt nutrition history at this time. Per weight records, pt with a 17% weight loss over the past 1 month. Per MD, pt with little to no PO today. Pt currently has Ensure ordered and has been refusing them. RD to order Nepro shake and Prostat instead to aid in caloric and protein needs.   Unable to complete Nutrition-Focused physical exam at this time.   Labs and medications reviewed. Potassium elevated at 5.4. Phosphorous elevated at 9.7.  Diet Order:   Diet Order            Diet renal/carb modified with fluid restriction Diet-HS Snack? Nothing; Fluid restriction: 1200 mL Fluid; Room service appropriate? Yes; Fluid consistency: Thin  Diet effective now              EDUCATION NEEDS:   Not appropriate for education at this time  Skin:  Skin Assessment: Reviewed RN  Assessment  Last BM:  2/15  Height:   Ht Readings from Last 1 Encounters:  02/25/19 5\' 5"  (1.651 m)    Weight:   Wt Readings from Last 1 Encounters:  02/25/19 65 kg    BMI:  Body mass index is 23.85 kg/m.  Estimated Nutritional Needs:   Kcal:  1900-2100  Protein:  95-105 grams  Fluid:  1.2 L/day  Corrin Parker, MS, RD, LDN RD pager number/after hours weekend pager number on Amion.

## 2019-02-27 LAB — RENAL FUNCTION PANEL
Albumin: 2.3 g/dL — ABNORMAL LOW (ref 3.5–5.0)
Anion gap: 17 — ABNORMAL HIGH (ref 5–15)
BUN: 71 mg/dL — ABNORMAL HIGH (ref 6–20)
CO2: 17 mmol/L — ABNORMAL LOW (ref 22–32)
Calcium: 8.2 mg/dL — ABNORMAL LOW (ref 8.9–10.3)
Chloride: 103 mmol/L (ref 98–111)
Creatinine, Ser: 12.86 mg/dL — ABNORMAL HIGH (ref 0.44–1.00)
GFR calc Af Amer: 4 mL/min — ABNORMAL LOW (ref >60)
GFR calc non Af Amer: 3 mL/min — ABNORMAL LOW (ref >60)
Glucose, Bld: 111 mg/dL — ABNORMAL HIGH (ref 70–99)
Phosphorus: 10.1 mg/dL — ABNORMAL HIGH (ref 2.5–4.6)
Potassium: 5.4 mmol/L — ABNORMAL HIGH (ref 3.5–5.1)
Sodium: 137 mmol/L (ref 135–145)

## 2019-02-27 LAB — GLUCOSE, CAPILLARY
Glucose-Capillary: 105 mg/dL — ABNORMAL HIGH (ref 70–99)
Glucose-Capillary: 120 mg/dL — ABNORMAL HIGH (ref 70–99)
Glucose-Capillary: 124 mg/dL — ABNORMAL HIGH (ref 70–99)
Glucose-Capillary: 145 mg/dL — ABNORMAL HIGH (ref 70–99)

## 2019-02-27 MED ORDER — SODIUM ZIRCONIUM CYCLOSILICATE 10 G PO PACK
10.0000 g | PACK | Freq: Every day | ORAL | Status: DC
  Administered 2019-02-27 – 2019-02-28 (×2): 10 g via ORAL
  Filled 2019-02-27 (×2): qty 1

## 2019-02-27 MED ORDER — METOCLOPRAMIDE HCL 5 MG/ML IJ SOLN
10.0000 mg | Freq: Three times a day (TID) | INTRAMUSCULAR | Status: DC
  Administered 2019-03-01: 10 mg via INTRAVENOUS
  Filled 2019-02-27 (×3): qty 2

## 2019-02-27 MED ORDER — SODIUM CHLORIDE 0.9 % IV SOLN
250.0000 mg | Freq: Three times a day (TID) | INTRAVENOUS | Status: DC
  Administered 2019-02-27: 250 mg via INTRAVENOUS
  Filled 2019-02-27 (×13): qty 5

## 2019-02-27 NOTE — Care Management Important Message (Signed)
Important Message  Patient Details  Name: Susan Horton MRN: [id number] Date of Birth: Mar 19, 1970   Medicare Important Message Given:  Yes - Important Message mailed due to current National Emergency  Verbal consent obtained due to current National Emergency  Relationship to patient: Self Contact Name: Charlotta Lapaglia Call Date: 02/27/19  Time: 1521 Phone: [phone]   Important Message mailed to: Patient address on file    [name] 02/27/2019, 3:26 PM

## 2019-02-27 NOTE — Progress Notes (Addendum)
Pt reports vomiting about 4 times through the night, refused labs this morning.  Pt states "I should just go home, they aren't doing anything for me here."

## 2019-02-27 NOTE — Progress Notes (Signed)
  [facility] Progress Note   Assessment/ Plan:   Outpt PD: PD orders CCPD, 7X Week, EDW 75.5 (kg) Exchanges 5, fill vol 2000 mL, Dwell Time 1 hrs 30 min, last fill vol 1000 mL, Day Exchanges 0, Day Dwell Time 0 hrs 0 min     Assessment/ Plan: 1. Nausea/ vomiting - per primary, poss d/t COVID infection, also h/o gastroparesis. No abd pain. Discussed with pt importance of trying to do PD tonight to get cell ct and culture- she understands. 2. ESRD - on PD. Plan PD nightly while here. Hav increased from 4 to 5 fills overnight while here, no day bag.  Will decrease to 1.5 L fill here.   3. Hyperkalemia - mild, continue lokelma 4. COVID+ PNA - w/ some GG changes on CT, getting remdesivir, decadron, no IV abx 5. DM on insulin 6. HTN/vol - bp's up a little, cont metop as at home. Euvolemic on exam. Continue 1.5% fluids today. 7. CAD h/o stent  Subjective:    Refused PD overnight, couldn't collect cell ct and culture.  N/v continues.  Trying to eat a salad today.   Objective:   BP (!) 160/82 (BP Location: Right Arm)   Pulse 88   Temp 98.3 F (36.8 C) (Oral)   Resp 18   Ht 5\' 5"  (1.651 m)   Wt 65 kg   SpO2 96%   BMI 23.85 kg/m   Physical Exam: Gen: sitting in bed, eating a salad CVS: RRR Resp: clear bilaterally no c/w/r Abd: soft, mildly diffusely tender to palpation, PD cath in place Ext: no LE edema  Labs: BMET Recent Labs  Lab 02/24/19 1622 02/25/19 0410 02/26/19 1109 02/27/19 1237  NA 141 138 138 137  K 5.4* 5.7* 5.4* 5.4*  CL 101 102 102 103  CO2 20* 20* 17* 17*  GLUCOSE 74 168* 82 111*  BUN 78* 76* 67* 71*  CREATININE 15.56* 14.75* 12.60* 12.86*  CALCIUM 8.9 8.0* 8.8* 8.2*  PHOS  --   --  9.7* 10.1*   CBC Recent Labs  Lab 02/24/19 1622 02/26/19 1109  WBC 7.4 3.2*  HGB 10.9* 9.6*  HCT 36.2 30.9*  MCV 86.4 83.7  PLT 439* 348      Medications:    . atorvastatin  40 mg Oral Daily  . calcitRIOL  1.5 mcg Oral Daily  . calcium  acetate  2,001 mg Oral TID WC  . clopidogrel  75 mg Oral Q breakfast  . dexamethasone (DECADRON) injection  6 mg Intravenous Q24H  . feeding supplement (NEPRO CARB STEADY)  237 mL Oral TID BM  . feeding supplement (PRO-STAT SUGAR FREE 64)  30 mL Oral BID  . ferrous sulfate  325 mg Oral Q breakfast  . gentamicin cream  1 application Topical Daily  . heparin  5,000 Units Subcutaneous Q8H  . insulin aspart  0-6 Units Subcutaneous TID WC  . isosorbide mononitrate  30 mg Oral Daily  . metoCLOPramide (REGLAN) injection  5 mg Intravenous Q8H  . metoprolol succinate  50 mg Oral Daily  . mometasone-formoterol  2 puff Inhalation BID  . pantoprazole (PROTONIX) IV  40 mg Intravenous Daily  . sodium chloride flush  3 mL Intravenous Once  . sodium zirconium cyclosilicate  10 g Oral Daily     Madelon Lips, MD 02/27/2019, 2:15 PM

## 2019-02-27 NOTE — Progress Notes (Signed)
Patient refusing to let IV team stick her and place a line for her intravenous medications.  Patient also refusing to let us get her blood sugar and take her vital signs.  She is refusing this medical care for now..[facility] doctor on call notified and made aware of patient's actions at this time.  Will continue to monitor patient closely.

## 2019-02-27 NOTE — Progress Notes (Signed)
PROGRESS NOTE                                                                                                                                                                                                             Patient Demographics:    Susan Horton, is a 49 y.o. female, DOB - 1970-08-28, LJQ:[id number]  Admit date - 02/24/2019   Admitting Physician Shela Leff, MD  Outpatient Primary MD for the patient is Charlott Rakes, MD  LOS - 3   Chief Complaint  Patient presents with   Abdominal Pain   Emesis       Brief Narrative    Susan Horton is a 49 y.o. female with medical history significant of ESRD on peritoneal dialysis, insulin-dependent type 2 diabetes, hypertension, CHF, PVD, CAD status post PCI, GERD, gastroparesis, history of cholecystectomy, history of sacral abscess presenting to the ED for evaluation of abdominal pain and emesis.  Patient reports 4-day history of nonbloody, nonbilious emesis.  She has not been able to tolerate p.o. intake.  Also having epigastric abdominal pain.  States nausea/vomiting is a chronic problem for her due to diabetic gastroparesis but it is much worse now.  Denies fevers, chills, or diarrhea.  No recent sick contacts.  Denies cough, shortness of breath, or chest pain.  As well patient tested positive for COVID-19, patient with recent hospitalization secondary to sacral abscess, wound has been good, currently finished antibiotic, wound is improving with wound care, CT abdomen pelvis without acute intra-abdominal pathology.     Subjective:    Susan Horton today still reports some nausea, she did report vomiting overnight .   Assessment  & Plan :    Principal Problem:   Intractable nausea and vomiting Active Problems:   Gastroparesis   Essential hypertension, benign   Epigastric abdominal pain   Multifocal pneumonia   Epigastric abdominal pain, intractable nausea and vomiting  - suspect due to  diabetic gastroparesis, possibly related to Covid gastroenteritis as well , but this appears to be overall chronic problem with acute exacerbation. - CT abdomen pelvis without acute intra-abdominal pathology.  Lipase and LFTs normal. -Remains with significant nausea, does report some vomiting overnight, continue gentle hydration , I will go ahead and increase her Reglan to 10 mg IV 3 times daily, and start on IV azithromycin. -Zofran as needed  for nausea/vomiting -Avoid narcotics at might worsen her gastroparesis, will complains of abdominal pain, requests some pain regimen temporarily, will start on Vicodin for the next 24 hours as needed. -Continue PPI for GERD -Patient refused her routine dialysis yesterday, so peritoneal fluid could not be sent for evaluation, patient reports she will comply with dialysis today.   COVID-19 pneumonia -Measuring significant for multifocal opacity(CT abdomen pelvis). -Continue with IV steroids and remdesivir. -Continue to trend inflammatory markers.  ESRD on PD -Potassium 5.7 this morning, will give another dose of Lokelma, continue with renal diet, continue with PD, managed by renal .  High anion gap metabolic acidosis Likely related to ESRD. Bicarb 20, anion gap 20.    -Consult nephrology in a.m. as mentioned above  History of sacral abscess Patient was recently admitted in 12/29-1/3 for a sacral abscess.  Status post I&D on 12/31.  Infectious disease was consulted and recommended 14-day course of linezolid.  CT  showing deep wound superficial to the sacrococcygeal region with soft tissue thickening and small gas locules with probable small amount of surgical change in the region.  No definite osseous destruction of the underlying sacrococcygeal bone to suggest osteomyelitis. -Wound care consult greatly appreciated, wound with no foul-smelling odor or discharge -Patient is followed by infectious disease, ensure follow-up.  Insulin-dependent type 2  diabetes -Having poor appetite , and couple episodes of low CBGs, so I have stopped long-acting insulin. -Sliding scale insulin very sensitive before meals and CBG checks  Hypertension -Blood pressure has improved after increasing his Imdur, continue with metoprolol . -Hydralazine as needed for SBP >170  COVID-19 Labs  Recent Labs    02/25/19 0638  DDIMER 1.62*  FERRITIN 2,091*  LDH 163  CRP 2.9*    Lab Results  Component Value Date   SARSCOV2NAA POSITIVE (A) 02/24/2019   [place] NEGATIVE 01/10/2019   [place] NEGATIVE 12/25/2018   [place] NEGATIVE 12/12/2018     Code Status : Full  Family Communication  : patient Is awake alert coherent, discussed with the patient  Disposition Plan  : home  Barriers For Discharge : remains with  Significant nausea, and very poor  oral intake today  Consults  :  renal  Procedures  : None  DVT Prophylaxis  :  [place] heparin  Lab Results  Component Value Date   PLT 348 02/26/2019    Antibiotics  :    Anti-infectives (From admission, onward)   Start     Dose/Rate Route Frequency Ordered Stop   02/26/19 1000  remdesivir 100 mg in sodium chloride 0.9 % 100 mL IVPB     100 mg 200 mL/hr over 30 Minutes Intravenous Daily 02/25/19 0031 03/02/19 0959   02/25/19 0200  remdesivir 200 mg in sodium chloride 0.9% 250 mL IVPB     200 mg 580 mL/hr over 30 Minutes Intravenous Once 02/25/19 0031 02/25/19 1900        Objective:   Vitals:   02/26/19 2200 02/27/19 0522 02/27/19 0909 02/27/19 1333  BP: (!) 157/73 (!) 167/75 (!) 150/70 (!) 160/82  Pulse: 87 91 81 88  Resp:    18  Temp: 98.6 F (37 C) 98.3 F (36.8 C)    TempSrc: Oral Oral    SpO2:    96%  Weight:      Height:        Wt Readings from Last 3 Encounters:  02/25/19 65 kg  02/05/19 66.9 kg  01/26/19 69.4 kg     Intake/Output Summary (  Last 24 hours) at 02/27/2019 1435 Last data filed at 02/27/2019 0900 Gross per 24 hour  Intake 360 ml  Output --  Net  360 ml     Physical Exam  Awake Alert, Oriented X 3, No new F.N deficits, Normal affect Symmetrical Chest wall movement, Good air movement bilaterally, CTAB RRR,No Gallops,Rubs or new Murmurs, No Parasternal Heave +ve B.Sounds, Abd Soft, No tenderness, No rebound - guarding or rigidity. No Cyanosis, Clubbing or edema, No new Rash or bruise       Data Review:    CBC Recent Labs  Lab 02/24/19 1622 02/26/19 1109  WBC 7.4 3.2*  HGB 10.9* 9.6*  HCT 36.2 30.9*  PLT 439* 348  MCV 86.4 83.7  MCH 26.0 26.0  MCHC 30.1 31.1  RDW 15.1 14.9    Chemistries  Recent Labs  Lab 02/24/19 1622 02/25/19 0410 02/26/19 1109 02/27/19 1237  NA 141 138 138 137  K 5.4* 5.7* 5.4* 5.4*  CL 101 102 102 103  CO2 20* 20* 17* 17*  GLUCOSE 74 168* 82 111*  BUN 78* 76* 67* 71*  CREATININE 15.56* 14.75* 12.60* 12.86*  CALCIUM 8.9 8.0* 8.8* 8.2*  AST 16  --   --   --   ALT 16  --   --   --   ALKPHOS 93  --   --   --   BILITOT 1.1  --   --   --    ------------------------------------------------------------------------------------------------------------------ No results for input(s): CHOL, HDL, LDLCALC, TRIG, CHOLHDL, LDLDIRECT in the last 72 hours.  Lab Results  Component Value Date   HGBA1C 7.5 (H) 12/26/2018   ------------------------------------------------------------------------------------------------------------------ No results for input(s): TSH, T4TOTAL, T3FREE, THYROIDAB in the last 72 hours.  Invalid input(s): FREET3 ------------------------------------------------------------------------------------------------------------------ Recent Labs    02/25/19 0638  FERRITIN 2,091*    Coagulation profile No results for input(s): INR, PROTIME in the last 168 hours.  Recent Labs    02/25/19 0638  DDIMER 1.62*    Cardiac Enzymes No results for input(s): CKMB, TROPONINI, MYOGLOBIN in the last 168 hours.  Invalid input(s):  CK ------------------------------------------------------------------------------------------------------------------    Component Value Date/Time   BNP 29.5 07/06/2016 0058    Inpatient Medications  Scheduled Meds:  atorvastatin  40 mg Oral Daily   calcitRIOL  1.5 mcg Oral Daily   calcium acetate  2,001 mg Oral TID WC   clopidogrel  75 mg Oral Q breakfast   dexamethasone (DECADRON) injection  6 mg Intravenous Q24H   feeding supplement (NEPRO CARB STEADY)  237 mL Oral TID BM   feeding supplement (PRO-STAT SUGAR FREE 64)  30 mL Oral BID   ferrous sulfate  325 mg Oral Q breakfast   gentamicin cream  1 application Topical Daily   heparin  5,000 Units Subcutaneous Q8H   insulin aspart  0-6 Units Subcutaneous TID WC   isosorbide mononitrate  30 mg Oral Daily   metoCLOPramide (REGLAN) injection  5 mg Intravenous Q8H   metoprolol succinate  50 mg Oral Daily   mometasone-formoterol  2 puff Inhalation BID   pantoprazole (PROTONIX) IV  40 mg Intravenous Daily   sodium chloride flush  3 mL Intravenous Once   sodium zirconium cyclosilicate  10 g Oral Daily   Continuous Infusions:  dialysis solution 1.5% low-MG/low-CA     dialysis solution 2.5% low-MG/low-CA     remdesivir 100 mg in NS 100 mL 100 mg (02/27/19 0916)   PRN Meds:.acetaminophen **OR** acetaminophen, albuterol, calcium acetate, dianeal solution for CAPD/CCPD  with heparin, dianeal solution for CAPD/CCPD with heparin, hydrALAZINE, HYDROcodone-acetaminophen, montelukast, ondansetron (ZOFRAN) IV  Micro Results Recent Results (from the past 240 hour(s))  Respiratory Panel by RT PCR (Flu A&B, Covid) - Nasopharyngeal Swab     Status: Abnormal   Collection Time: 02/24/19  8:58 PM   Specimen: Nasopharyngeal Swab  Result Value Ref Range Status   SARS Coronavirus 2 by RT PCR POSITIVE (A) NEGATIVE Final    Comment: RESULT CALLED TO, READ BACK BY AND VERIFIED WITH: A. CAIN,RN 0018 02/25/2019 T.  TYSOR (NOTE) SARS-CoV-2 target nucleic acids are DETECTED. SARS-CoV-2 RNA is generally detectable in upper respiratory specimens  during the acute phase of infection. Positive results are indicative of the presence of the identified virus, but do not rule out bacterial infection or co-infection with other pathogens not detected by the test. Clinical correlation with patient history and other diagnostic information is necessary to determine patient infection status. The expected result is Negative. Fact Sheet for Patients:  PinkCheek.be Fact Sheet for Healthcare Providers: GravelBags.it This test is not yet approved or cleared by the [place] FDA and  has been authorized for detection and/or diagnosis of SARS-CoV-2 by FDA under an Emergency Use Authorization (EUA).  This EUA will remain in effect (meaning this test can be used) f or the duration of  the COVID-19 declaration under Section 564(b)(1) of the Act, 21 U.S.C. section 360bbb-3(b)(1), unless the authorization is terminated or revoked sooner.    Influenza A by PCR NEGATIVE NEGATIVE Final   Influenza B by PCR NEGATIVE NEGATIVE Final    Comment: (NOTE) The Xpert Xpress SARS-CoV-2/FLU/RSV assay is intended as an aid in  the diagnosis of influenza from Nasopharyngeal swab specimens and  should not be used as a sole basis for treatment. Nasal washings and  aspirates are unacceptable for Xpert Xpress SARS-CoV-2/FLU/RSV  testing. Fact Sheet for Patients: PinkCheek.be Fact Sheet for Healthcare Providers: GravelBags.it This test is not yet approved or cleared by the [place] FDA and  has been authorized for detection and/or diagnosis of SARS-CoV-2 by  FDA under an Emergency Use Authorization (EUA). This EUA will remain  in effect (meaning this test can be used) for the duration of the  Covid-19 declaration  under Section 564(b)(1) of the Act, 21  U.S.C. section 360bbb-3(b)(1), unless the authorization is  terminated or revoked. Performed at [facility], [place] [address] [place], [place] [address]     Radiology Reports CT ABDOMEN PELVIS WO CONTRAST  Result Date: 02/24/2019 CLINICAL DATA:  Generalized abdominal pain with vomiting EXAM: CT ABDOMEN AND PELVIS WITHOUT CONTRAST TECHNIQUE: Multidetector CT imaging of the abdomen and pelvis was performed following the standard protocol without IV contrast. COMPARISON:  CT 12/27/2018, 12/25/2018, MRI 01/10/2019 FINDINGS: Lower chest: Lung bases demonstrate scattered foci of ground-glass density and patchy consolidation mostly at the periphery of the lingula and left greater than right lung base. No pleural effusion. Heart size within normal limits. Hepatobiliary: No focal liver abnormality is seen. Status post cholecystectomy. No biliary dilatation. Pancreas: Unremarkable. No pancreatic ductal dilatation or surrounding inflammatory changes. Spleen: Normal in size without focal abnormality. Adrenals/Urinary Tract: Adrenal glands are normal. Nonspecific perinephric fat stranding. No hydronephrosis. Stomach/Bowel: Stomach is nonenlarged. No dilated small bowel. History of prior appendectomy. No bowel wall thickening. Vascular/Lymphatic: Extensive atherosclerosis. No aneurysmal dilatation. Subcentimeter retroperitoneal nodes. Reproductive: Status post hysterectomy. No adnexal masses. Other: Negative for free air or free fluid. Peritoneal dialysis catheter in the left lower quadrant. Musculoskeletal: No acute  osseous abnormality. Deep wound with soft tissue thickening and small gas pockets superficial to the sacrococcygeal region at the site of prior rim enhancing fluid collection. IMPRESSION: 1. Patchy foci of ground-glass density and consolidation at the peripheral lung bases and lingula suspect for multifocal infection/possible atypical or viral pneumonia. 2.  Deep wound superficial to the sacrococcygeal region with soft tissue thickening and small gas locules with probable small amount of surgical change in the region. No definitive osseous destruction of the underlying sacrococcygeal bone to suggest osteomyelitis by CT. Electronically Signed   By: Donavan Foil M.D.   On: 02/24/2019 19:25   DG Chest Portable 1 View  Result Date: 02/24/2019 CLINICAL DATA:  Shortness of breath, abdominal pain, vomiting EXAM: PORTABLE CHEST 1 VIEW COMPARISON:  09/16/2018 FINDINGS: Lungs are clear.  No pleural effusion or pneumothorax. The heart is normal in size. IMPRESSION: No evidence of acute cardiopulmonary disease. Electronically Signed   By: Julian Hy M.D.   On: 02/24/2019 21:06      Phillips Climes M.D on 02/27/2019 at 2:35 PM  Between 7am to 7pm - Pager - [phone]  After 7pm go to [email or web address] - password [facility]  [facility] -  Office  [phone]

## 2019-02-28 LAB — BODY FLUID CELL COUNT WITH DIFFERENTIAL
Lymphs, Fluid: 29 %
Monocyte-Macrophage-Serous Fluid: 37 % — ABNORMAL LOW (ref 50–90)
Neutrophil Count, Fluid: 34 % — ABNORMAL HIGH (ref 0–25)
Total Nucleated Cell Count, Fluid: 540 cu mm (ref 0–1000)

## 2019-02-28 LAB — RENAL FUNCTION PANEL
Albumin: 2.2 g/dL — ABNORMAL LOW (ref 3.5–5.0)
Anion gap: 17 — ABNORMAL HIGH (ref 5–15)
BUN: 66 mg/dL — ABNORMAL HIGH (ref 6–20)
CO2: 19 mmol/L — ABNORMAL LOW (ref 22–32)
Calcium: 7.7 mg/dL — ABNORMAL LOW (ref 8.9–10.3)
Chloride: 101 mmol/L (ref 98–111)
Creatinine, Ser: 11.82 mg/dL — ABNORMAL HIGH (ref 0.44–1.00)
GFR calc Af Amer: 4 mL/min — ABNORMAL LOW (ref >60)
GFR calc non Af Amer: 3 mL/min — ABNORMAL LOW (ref >60)
Glucose, Bld: 88 mg/dL (ref 70–99)
Phosphorus: 7.9 mg/dL — ABNORMAL HIGH (ref 2.5–4.6)
Potassium: 4 mmol/L (ref 3.5–5.1)
Sodium: 137 mmol/L (ref 135–145)

## 2019-02-28 LAB — GLUCOSE, CAPILLARY
Glucose-Capillary: 91 mg/dL (ref 70–99)
Glucose-Capillary: 84 mg/dL (ref 70–99)
Glucose-Capillary: 120 mg/dL — ABNORMAL HIGH (ref 70–99)

## 2019-02-28 LAB — CBC
HCT: 30.9 % — ABNORMAL LOW (ref 36.0–46.0)
Hemoglobin: 9.3 g/dL — ABNORMAL LOW (ref 12.0–15.0)
MCH: 25.3 pg — ABNORMAL LOW (ref 26.0–34.0)
MCHC: 30.1 g/dL (ref 30.0–36.0)
MCV: 84.2 fL (ref 80.0–100.0)
Platelets: 341 10*3/uL (ref 150–400)
RBC: 3.67 MIL/uL — ABNORMAL LOW (ref 3.87–5.11)
RDW: 14.9 % (ref 11.5–15.5)
WBC: 7.6 10*3/uL (ref 4.0–10.5)
nRBC: 0 % (ref 0.0–0.2)

## 2019-02-28 NOTE — Progress Notes (Signed)
   [facility] Progress Note   Assessment/ Plan:   Outpt PD: PD orders CCPD, 7X Week, EDW 75.5 (kg) Exchanges 5, fill vol 2000 mL, Dwell Time 1 hrs 30 min, last fill vol 1000 mL, Day Exchanges 0, Day Dwell Time 0 hrs 0 min   Assessment/ Plan: 1. Nausea/ vomiting - per primary, poss d/t COVID infection, also h/o gastroparesis. Some mild epigastric pain but not really as tender as expected for peritonitis.  Cell ct and culture pending, lower suspicion, but in setting of symptoms will need to eval. 2. ESRD - on PD. Plan PD nightly while here. Hav increased from 4 to 5 fills overnight while here, no day bag.  Will decrease to 1.5 L fill here.   3. Hyperkalemia - resolved, stop lokelma 4. COVID+ PNA - w/ some GG changes on CT, getting remdesivir, decadron, no IV abx 5. DM on insulin 6. HTN/vol - no overall gross volume 7. CAD h/o stent 8. Dispo: pending  Subjective:    Did PD last night with 600 UF.  Vomited x 4 yesterday.  Has mild epigastric pain.   Objective:   BP (!) 158/66 (BP Location: Right Arm)   Pulse 87   Temp 98.4 F (36.9 C) (Oral)   Resp 18   Ht 5\' 5"  (1.651 m)   Wt 65.9 kg   SpO2 100%   BMI 24.18 kg/m   Physical Exam: Gen: lying in bed, NAD CVS: RRR Resp: clear bilaterally no c/w/r Abd: soft, mildly diffusely tender to palpation, PD cath in place Ext: no LE edema  Labs: BMET Recent Labs  Lab 02/24/19 1622 02/25/19 0410 02/26/19 1109 02/27/19 1237 02/28/19 0636  NA 141 138 138 137 137  K 5.4* 5.7* 5.4* 5.4* 4.0  CL 101 102 102 103 101  CO2 20* 20* 17* 17* 19*  GLUCOSE 74 168* 82 111* 88  BUN 78* 76* 67* 71* 66*  CREATININE 15.56* 14.75* 12.60* 12.86* 11.82*  CALCIUM 8.9 8.0* 8.8* 8.2* 7.7*  PHOS  --   --  9.7* 10.1* 7.9*   CBC Recent Labs  Lab 02/24/19 1622 02/26/19 1109 02/28/19 0636  WBC 7.4 3.2* 7.6  HGB 10.9* 9.6* 9.3*  HCT 36.2 30.9* 30.9*  MCV 86.4 83.7 84.2  PLT 439* 348 341      Medications:    . atorvastatin   40 mg Oral Daily  . calcitRIOL  1.5 mcg Oral Daily  . calcium acetate  2,001 mg Oral TID WC  . clopidogrel  75 mg Oral Q breakfast  . dexamethasone (DECADRON) injection  6 mg Intravenous Q24H  . feeding supplement (NEPRO CARB STEADY)  237 mL Oral TID BM  . feeding supplement (PRO-STAT SUGAR FREE 64)  30 mL Oral BID  . ferrous sulfate  325 mg Oral Q breakfast  . gentamicin cream  1 application Topical Daily  . heparin  5,000 Units Subcutaneous Q8H  . insulin aspart  0-6 Units Subcutaneous TID WC  . isosorbide mononitrate  30 mg Oral Daily  . metoCLOPramide (REGLAN) injection  10 mg Intravenous Q8H  . metoprolol succinate  50 mg Oral Daily  . mometasone-formoterol  2 puff Inhalation BID  . pantoprazole (PROTONIX) IV  40 mg Intravenous Daily  . sodium chloride flush  3 mL Intravenous Once  . sodium zirconium cyclosilicate  10 g Oral Daily     Madelon Lips, MD 02/28/2019, 12:43 PM

## 2019-02-28 NOTE — Progress Notes (Signed)
PROGRESS NOTE                                                                                                                                                                                                             Patient Demographics:    Susan Horton, is a 49 y.o. female, DOB - 09-26-1970, VOH:[id number]  Outpatient Primary MD for the patient is Charlott Rakes, MD   Admit date - 02/24/2019   LOS - 4  Chief Complaint  Patient presents with  . Abdominal Pain  . Emesis       Brief Narrative: Patient is a 49 y.o. female with PMHx of ESRD-on PD, IDDM, HTN, gastroparesis, GERD, CAD s/p PCI, history of sacral abscess-presenting with abdominal pain and vomiting-found to have COVID-19 along with exacerbation of underlying diabetic gastroparesis.  See below for further details.   Subjective:    Susan Horton today wants to go home-she lost IV access yesterday.  She is vomiting-however feels that her vomiting has improved-and is not very far from baseline (apparently at baseline-vomits at least 2-3 times a day)   Assessment  & Plan :   Intractable nausea/vomiting with epigastric pain: Benign abdominal exam-high suspicion for gastroparesis flare provoked by COVID-19.  Follows with GI at [place] are to continue with scheduled Reglan-and IV erythromycin (if we can place a peripheral line).  However-vomiting seems to have improved-patient is anxious to go home-encouraged small frequent meals.  Doubt peritonitis-awaiting PD dialysate culture/sensitivity.  Covid 19 Viral pneumonia: Improved-on room air-continue steroids/remdesivir.  Fever: afebrile  O2 requirements:  SpO2: 100 %   COVID-19 Labs: No results for input(s): DDIMER, FERRITIN, LDH, CRP in the last 72 hours.     Component Value Date/Time   BNP 29.5 07/06/2016 0058    Recent Labs  Lab 02/25/19 0638  PROCALCITON 0.36    Lab Results    Component Value Date   SARSCOV2NAA POSITIVE (A) 02/24/2019   [place] NEGATIVE 01/10/2019   [place] NEGATIVE 12/25/2018   [place] NEGATIVE 12/12/2018     COVID-19 Medications: Steroids:2/13 Remdesivir:2/13  Prone/Incentive Spirometry: encouraged  incentive spirometry use 3-4/hour.  DVT Prophylaxis  :   Heparin   ESRD: On PD-nephrology following and directing care  History of CAD: No anginal symptoms-continue antiplatelet agents, beta-blocker and statin  IDDM-2: Due to poor oral  intake only on SSI-CBGs stable.  Resume long-acting insulin when able.  CBG (last 3)  Recent Labs    02/27/19 1611 02/28/19 0817 02/28/19 1139  GLUCAP 124* 91 84   HTN: Controlled-continue metoprolol, Imdur  History of sacral abscess: Wound care following-no active abscess since clinically.  Nutrition Problem: Nutrition Problem: Increased nutrient needs Etiology: chronic illness(ESRD PD, CHF) Signs/Symptoms: estimated needs Interventions: Nepro shake, Prostat  Consults  :  Renal  Procedures  :  None  ABG:    Component Value Date/Time   HCO3 25.2 (H) 03/21/2013 1906   TCO2 29 12/14/2018 0610   ACIDBASEDEF 1.0 03/21/2013 1906   O2SAT 36.0 03/21/2013 1906    Vent Settings: N/A  Condition -  Guarded  Family Communication  : Patient to communicate with family herself.  Code Status :  Full Code  Diet :  Diet Order            Diet renal/carb modified with fluid restriction Diet-HS Snack? Nothing; Fluid restriction: 1200 mL Fluid; Room service appropriate? Yes; Fluid consistency: Thin  Diet effective now               Disposition Plan  :  Remain hospitalized-Home in the next few days if she remains stable and improved.  Barriers to discharge: Hypoxia requiring O2 supplementation/complete 5 days of IV Remdesivir/still on IV Reglan/IV erythromycin  Antimicorbials  :    Anti-infectives (From admission, onward)   Start     Dose/Rate Route Frequency Ordered Stop    02/27/19 1530  erythromycin 250 mg in sodium chloride 0.9 % 100 mL IVPB     250 mg 100 mL/hr over 60 Minutes Intravenous Every 8 hours 02/27/19 1442     02/26/19 1000  remdesivir 100 mg in sodium chloride 0.9 % 100 mL IVPB     100 mg 200 mL/hr over 30 Minutes Intravenous Daily 02/25/19 0031 03/02/19 0959   02/25/19 0200  remdesivir 200 mg in sodium chloride 0.9% 250 mL IVPB     200 mg 580 mL/hr over 30 Minutes Intravenous Once 02/25/19 0031 02/25/19 1900      Inpatient Medications  Scheduled Meds: . atorvastatin  40 mg Oral Daily  . calcitRIOL  1.5 mcg Oral Daily  . calcium acetate  2,001 mg Oral TID WC  . clopidogrel  75 mg Oral Q breakfast  . dexamethasone (DECADRON) injection  6 mg Intravenous Q24H  . feeding supplement (NEPRO CARB STEADY)  237 mL Oral TID BM  . feeding supplement (PRO-STAT SUGAR FREE 64)  30 mL Oral BID  . ferrous sulfate  325 mg Oral Q breakfast  . gentamicin cream  1 application Topical Daily  . heparin  5,000 Units Subcutaneous Q8H  . insulin aspart  0-6 Units Subcutaneous TID WC  . isosorbide mononitrate  30 mg Oral Daily  . metoCLOPramide (REGLAN) injection  10 mg Intravenous Q8H  . metoprolol succinate  50 mg Oral Daily  . mometasone-formoterol  2 puff Inhalation BID  . pantoprazole (PROTONIX) IV  40 mg Intravenous Daily  . sodium chloride flush  3 mL Intravenous Once  . sodium zirconium cyclosilicate  10 g Oral Daily   Continuous Infusions: . dialysis solution 1.5% low-MG/low-CA    . dialysis solution 2.5% low-MG/low-CA    . erythromycin 250 mg (02/27/19 1532)  . remdesivir 100 mg in NS 100 mL 100 mg (02/27/19 0916)   PRN Meds:.acetaminophen **OR** acetaminophen, albuterol, calcium acetate, dianeal solution for CAPD/CCPD with heparin, dianeal solution for CAPD/CCPD  with heparin, hydrALAZINE, HYDROcodone-acetaminophen, montelukast, ondansetron (ZOFRAN) IV   Time Spent in minutes  25  See all Orders from today for further details   Oren Binet M.D on 02/28/2019 at 11:26 AM  To page go to [email or web address] - use universal password  [facility] -  Office  [phone]    Objective:   Vitals:   02/27/19 1333 02/27/19 1604 02/27/19 1815 02/28/19 0730  BP: (!) 160/82 (!) 154/72 140/65 (!) 158/66  Pulse: 88 78 86 87  Resp: 18 13 11 18   Temp:  97.7 F (36.5 C) 98.3 F (36.8 C) 98.4 F (36.9 C)  TempSrc:  Oral Oral Oral  SpO2: 96% 97% 100% 100%  Weight:    65.9 kg  Height:        Wt Readings from Last 3 Encounters:  02/28/19 65.9 kg  02/05/19 66.9 kg  01/26/19 69.4 kg     Intake/Output Summary (Last 24 hours) at 02/28/2019 1126 Last data filed at 02/27/2019 2020 Gross per 24 hour  Intake 690 ml  Output 150 ml  Net 540 ml     Physical Exam Gen Exam:Alert awake-not in any distress HEENT:atraumatic, normocephalic Chest: B/L clear to auscultation anteriorly CVS:S1S2 regular Abdomen:soft non tender, non distended Extremities:no edema Neurology: Non focal Skin: no rash   Data Review:    CBC Recent Labs  Lab 02/24/19 1622 02/26/19 1109 02/28/19 0636  WBC 7.4 3.2* 7.6  HGB 10.9* 9.6* 9.3*  HCT 36.2 30.9* 30.9*  PLT 439* 348 341  MCV 86.4 83.7 84.2  MCH 26.0 26.0 25.3*  MCHC 30.1 31.1 30.1  RDW 15.1 14.9 14.9    Chemistries  Recent Labs  Lab 02/24/19 1622 02/25/19 0410 02/26/19 1109 02/27/19 1237 02/28/19 0636  NA 141 138 138 137 137  K 5.4* 5.7* 5.4* 5.4* 4.0  CL 101 102 102 103 101  CO2 20* 20* 17* 17* 19*  GLUCOSE 74 168* 82 111* 88  BUN 78* 76* 67* 71* 66*  CREATININE 15.56* 14.75* 12.60* 12.86* 11.82*  CALCIUM 8.9 8.0* 8.8* 8.2* 7.7*  AST 16  --   --   --   --   ALT 16  --   --   --   --   ALKPHOS 93  --   --   --   --   BILITOT 1.1  --   --   --   --    ------------------------------------------------------------------------------------------------------------------ No results for input(s): CHOL, HDL, LDLCALC, TRIG, CHOLHDL, LDLDIRECT in the last 72 hours.  Lab  Results  Component Value Date   HGBA1C 7.5 (H) 12/26/2018   ------------------------------------------------------------------------------------------------------------------ No results for input(s): TSH, T4TOTAL, T3FREE, THYROIDAB in the last 72 hours.  Invalid input(s): FREET3 ------------------------------------------------------------------------------------------------------------------ No results for input(s): VITAMINB12, FOLATE, FERRITIN, TIBC, IRON, RETICCTPCT in the last 72 hours.  Coagulation profile No results for input(s): INR, PROTIME in the last 168 hours.  No results for input(s): DDIMER in the last 72 hours.  Cardiac Enzymes No results for input(s): CKMB, TROPONINI, MYOGLOBIN in the last 168 hours.  Invalid input(s): CK ------------------------------------------------------------------------------------------------------------------    Component Value Date/Time   BNP 29.5 07/06/2016 0058    Micro Results Recent Results (from the past 240 hour(s))  Respiratory Panel by RT PCR (Flu A&B, Covid) - Nasopharyngeal Swab     Status: Abnormal   Collection Time: 02/24/19  8:58 PM   Specimen: Nasopharyngeal Swab  Result Value Ref Range Status   SARS Coronavirus 2 by RT PCR POSITIVE (A) NEGATIVE  Final    Comment: RESULT CALLED TO, READ BACK BY AND VERIFIED WITH: A. CAIN,RN 0018 02/25/2019 T. TYSOR (NOTE) SARS-CoV-2 target nucleic acids are DETECTED. SARS-CoV-2 RNA is generally detectable in upper respiratory specimens  during the acute phase of infection. Positive results are indicative of the presence of the identified virus, but do not rule out bacterial infection or co-infection with other pathogens not detected by the test. Clinical correlation with patient history and other diagnostic information is necessary to determine patient infection status. The expected result is Negative. Fact Sheet for Patients:  PinkCheek.be Fact Sheet for  Healthcare Providers: GravelBags.it This test is not yet approved or cleared by the [place] FDA and  has been authorized for detection and/or diagnosis of SARS-CoV-2 by FDA under an Emergency Use Authorization (EUA).  This EUA will remain in effect (meaning this test can be used) f or the duration of  the COVID-19 declaration under Section 564(b)(1) of the Act, 21 U.S.C. section 360bbb-3(b)(1), unless the authorization is terminated or revoked sooner.    Influenza A by PCR NEGATIVE NEGATIVE Final   Influenza B by PCR NEGATIVE NEGATIVE Final    Comment: (NOTE) The Xpert Xpress SARS-CoV-2/FLU/RSV assay is intended as an aid in  the diagnosis of influenza from Nasopharyngeal swab specimens and  should not be used as a sole basis for treatment. Nasal washings and  aspirates are unacceptable for Xpert Xpress SARS-CoV-2/FLU/RSV  testing. Fact Sheet for Patients: PinkCheek.be Fact Sheet for Healthcare Providers: GravelBags.it This test is not yet approved or cleared by the [place] FDA and  has been authorized for detection and/or diagnosis of SARS-CoV-2 by  FDA under an Emergency Use Authorization (EUA). This EUA will remain  in effect (meaning this test can be used) for the duration of the  Covid-19 declaration under Section 564(b)(1) of the Act, 21  U.S.C. section 360bbb-3(b)(1), unless the authorization is  terminated or revoked. Performed at [facility], [place] [address], [place], [place] [address]     Radiology Reports CT ABDOMEN PELVIS WO CONTRAST  Result Date: 02/24/2019 CLINICAL DATA:  Generalized abdominal pain with vomiting EXAM: CT ABDOMEN AND PELVIS WITHOUT CONTRAST TECHNIQUE: Multidetector CT imaging of the abdomen and pelvis was performed following the standard protocol without IV contrast. COMPARISON:  CT 12/27/2018, 12/25/2018, MRI 01/10/2019 FINDINGS: Lower chest: Lung  bases demonstrate scattered foci of ground-glass density and patchy consolidation mostly at the periphery of the lingula and left greater than right lung base. No pleural effusion. Heart size within normal limits. Hepatobiliary: No focal liver abnormality is seen. Status post cholecystectomy. No biliary dilatation. Pancreas: Unremarkable. No pancreatic ductal dilatation or surrounding inflammatory changes. Spleen: Normal in size without focal abnormality. Adrenals/Urinary Tract: Adrenal glands are normal. Nonspecific perinephric fat stranding. No hydronephrosis. Stomach/Bowel: Stomach is nonenlarged. No dilated small bowel. History of prior appendectomy. No bowel wall thickening. Vascular/Lymphatic: Extensive atherosclerosis. No aneurysmal dilatation. Subcentimeter retroperitoneal nodes. Reproductive: Status post hysterectomy. No adnexal masses. Other: Negative for free air or free fluid. Peritoneal dialysis catheter in the left lower quadrant. Musculoskeletal: No acute osseous abnormality. Deep wound with soft tissue thickening and small gas pockets superficial to the sacrococcygeal region at the site of prior rim enhancing fluid collection. IMPRESSION: 1. Patchy foci of ground-glass density and consolidation at the peripheral lung bases and lingula suspect for multifocal infection/possible atypical or viral pneumonia. 2. Deep wound superficial to the sacrococcygeal region with soft tissue thickening and small gas locules with probable small amount of surgical  change in the region. No definitive osseous destruction of the underlying sacrococcygeal bone to suggest osteomyelitis by CT. Electronically Signed   By: Donavan Foil M.D.   On: 02/24/2019 19:25   DG Chest Portable 1 View  Result Date: 02/24/2019 CLINICAL DATA:  Shortness of breath, abdominal pain, vomiting EXAM: PORTABLE CHEST 1 VIEW COMPARISON:  09/16/2018 FINDINGS: Lungs are clear.  No pleural effusion or pneumothorax. The heart is normal in size.  IMPRESSION: No evidence of acute cardiopulmonary disease. Electronically Signed   By: Julian Hy M.D.   On: 02/24/2019 21:06

## 2019-03-01 ENCOUNTER — Inpatient Hospital Stay (HOSPITAL_COMMUNITY): Payer: Medicare Other

## 2019-03-01 HISTORY — PX: IR FLUORO GUIDE CV LINE RIGHT: IMG2283

## 2019-03-01 HISTORY — PX: IR US GUIDE VASC ACCESS RIGHT: IMG2390

## 2019-03-01 LAB — RENAL FUNCTION PANEL
Albumin: 2.2 g/dL — ABNORMAL LOW (ref 3.5–5.0)
Anion gap: 15 (ref 5–15)
BUN: 60 mg/dL — ABNORMAL HIGH (ref 6–20)
CO2: 21 mmol/L — ABNORMAL LOW (ref 22–32)
Calcium: 7.4 mg/dL — ABNORMAL LOW (ref 8.9–10.3)
Chloride: 99 mmol/L (ref 98–111)
Creatinine, Ser: 10.83 mg/dL — ABNORMAL HIGH (ref 0.44–1.00)
GFR calc Af Amer: 4 mL/min — ABNORMAL LOW (ref >60)
GFR calc non Af Amer: 4 mL/min — ABNORMAL LOW (ref >60)
Glucose, Bld: 101 mg/dL — ABNORMAL HIGH (ref 70–99)
Phosphorus: 7.2 mg/dL — ABNORMAL HIGH (ref 2.5–4.6)
Potassium: 4.1 mmol/L (ref 3.5–5.1)
Sodium: 135 mmol/L (ref 135–145)

## 2019-03-01 LAB — CBC
HCT: 31.8 % — ABNORMAL LOW (ref 36.0–46.0)
Hemoglobin: 9.9 g/dL — ABNORMAL LOW (ref 12.0–15.0)
MCH: 25.7 pg — ABNORMAL LOW (ref 26.0–34.0)
MCHC: 31.1 g/dL (ref 30.0–36.0)
MCV: 82.6 fL (ref 80.0–100.0)
Platelets: 328 10*3/uL (ref 150–400)
RBC: 3.85 MIL/uL — ABNORMAL LOW (ref 3.87–5.11)
RDW: 14.6 % (ref 11.5–15.5)
WBC: 7.2 10*3/uL (ref 4.0–10.5)
nRBC: 0 % (ref 0.0–0.2)

## 2019-03-01 LAB — GLUCOSE, CAPILLARY
Glucose-Capillary: 88 mg/dL (ref 70–99)
Glucose-Capillary: 81 mg/dL (ref 70–99)
Glucose-Capillary: 98 mg/dL (ref 70–99)
Glucose-Capillary: 125 mg/dL — ABNORMAL HIGH (ref 70–99)

## 2019-03-01 LAB — BODY FLUID CELL COUNT WITH DIFFERENTIAL: Total Nucleated Cell Count, Fluid: 19 cu mm (ref 0–1000)

## 2019-03-01 MED ORDER — POLYETHYLENE GLYCOL 3350 17 G PO PACK
17.0000 g | PACK | Freq: Every day | ORAL | Status: DC
  Filled 2019-03-01: qty 1

## 2019-03-01 MED ORDER — SODIUM CHLORIDE 0.9 % IV SOLN
100.0000 mg | Freq: Every day | INTRAVENOUS | Status: AC
  Administered 2019-03-01 – 2019-03-02 (×2): 100 mg via INTRAVENOUS
  Filled 2019-03-01 (×2): qty 20

## 2019-03-01 MED ORDER — LIDOCAINE HCL 1 % IJ SOLN
INTRAMUSCULAR | Status: AC
  Filled 2019-03-01: qty 20

## 2019-03-01 MED ORDER — METOCLOPRAMIDE HCL 5 MG/ML IJ SOLN
10.0000 mg | Freq: Three times a day (TID) | INTRAMUSCULAR | Status: DC
  Administered 2019-03-01: 10 mg via INTRAVENOUS
  Filled 2019-03-01 (×2): qty 2

## 2019-03-01 MED ORDER — LIDOCAINE HCL 1 % IJ SOLN
INTRAMUSCULAR | Status: DC | PRN
  Administered 2019-03-01: 5 mL

## 2019-03-01 MED ORDER — CHLORHEXIDINE GLUCONATE CLOTH 2 % EX PADS
6.0000 | MEDICATED_PAD | Freq: Every day | CUTANEOUS | Status: DC
  Administered 2019-03-02: 6 via TOPICAL

## 2019-03-01 NOTE — Procedures (Signed)
Interventional Radiology Procedure:   Indications: COVID-19 and poor venous access  Procedure: Central line placement  Findings: Right jugular dual lumen catheter, tip at SVC/RA junction  Complications: None     EBL: less than 10 ml  Plan: Central line is ready to use.     Rikita Grabert R. Anselm Pancoast, MD  Pager: [phone]

## 2019-03-01 NOTE — Progress Notes (Signed)
Patient refused erythromycin multiple times. RN educated pt on importance of taking antibiotics. MD notified.

## 2019-03-01 NOTE — Progress Notes (Signed)
Dialysis RN on unit to initiate PD-patient refused, education given regarding importance of dialysis treatments. Patient continues to refuse treatment, stating she wants to go home.

## 2019-03-01 NOTE — Progress Notes (Signed)
PROGRESS NOTE                                                                                                                                                                                                             Patient Demographics:    Susan Horton, is a 49 y.o. female, DOB - 19-Dec-1970, HQI:[id number]  Outpatient Primary MD for the patient is Charlott Rakes, MD   Admit date - 02/24/2019   LOS - 5  Chief Complaint  Patient presents with  . Abdominal Pain  . Emesis       Brief Narrative: Patient is a 49 y.o. female with PMHx of ESRD-on PD, IDDM, HTN, gastroparesis, GERD, CAD s/p PCI, history of sacral abscess-presenting with abdominal pain and vomiting-found to have COVID-19 along with exacerbation of underlying diabetic gastroparesis.  See below for further details.   Subjective:    Susan Horton had 2-3 episodes of vomiting yesterday-abdominal pain is mild.  She could not get any IV access yesterday-IR placed right IJ today.   Assessment  & Plan :   Intractable nausea/vomiting with epigastric pain: Likely secondary to gastroparesis flare provoked by COVID-19-she has a longstanding history of gastroparesis and follows with GI MD at [facility] and IV erythromycin.  Have encouraged small frequent meals.  Doubt PD catheter associated peritonitis (looks like fluid never sent for culture sensitivity)-nephrology following.    Covid 19 Viral pneumonia: Improved-on room air-continue steroids/remdesivir (got fourth dose today of remdesivir-after central line placed).  Fever: afebrile  O2 requirements:  SpO2: 97 %   COVID-19 Labs: No results for input(s): DDIMER, FERRITIN, LDH, CRP in the last 72 hours.     Component Value Date/Time   BNP 29.5 07/06/2016 0058    Recent Labs  Lab 02/25/19 0638  PROCALCITON 0.36    Lab Results  Component Value Date   SARSCOV2NAA POSITIVE (A)  02/24/2019   [place] NEGATIVE 01/10/2019   [place] NEGATIVE 12/25/2018   [place] NEGATIVE 12/12/2018     COVID-19 Medications: Steroids:2/13 Remdesivir:2/13  Prone/Incentive Spirometry: encouraged  incentive spirometry use 3-4/hour.  DVT Prophylaxis  :   Heparin   ESRD: On PD-nephrology following and directing care  History of CAD: No anginal symptoms-continue antiplatelet agents, beta-blocker and statin  IDDM-2: Due to poor oral intake only on SSI-CBGs stable.  Resume  long-acting insulin when able.  CBG (last 3)  Recent Labs    02/28/19 1642 03/01/19 0841 03/01/19 1222  GLUCAP 120* 88 81   HTN: Controlled-continue metoprolol, Imdur  History of sacral abscess: Wound care following-no active abscess since clinically.  Nutrition Problem: Nutrition Problem: Increased nutrient needs Etiology: chronic illness(ESRD PD, CHF) Signs/Symptoms: estimated needs Interventions: Nepro shake, Prostat  Consults  :  Renal  Procedures  :  None  ABG:    Component Value Date/Time   HCO3 25.2 (H) 03/21/2013 1906   TCO2 29 12/14/2018 0610   ACIDBASEDEF 1.0 03/21/2013 1906   O2SAT 36.0 03/21/2013 1906    Vent Settings: N/A  Condition -  Guarded  Family Communication  : Patient to communicate with family herself.  Code Status :  Full Code  Diet :  Diet Order            Diet renal/carb modified with fluid restriction Diet-HS Snack? Nothing; Fluid restriction: 1200 mL Fluid; Room service appropriate? Yes; Fluid consistency: Thin  Diet effective now               Disposition Plan  :  Remain hospitalized-Home in the next few days if she remains stable and improved.  Barriers to discharge: Hypoxia requiring O2 supplementation/complete 5 days of IV Remdesivir/still on IV Reglan/IV erythromycin  Antimicorbials  :    Anti-infectives (From admission, onward)   Start     Dose/Rate Route Frequency Ordered Stop   03/01/19 1200  remdesivir 100 mg in sodium chloride  0.9 % 100 mL IVPB     100 mg 200 mL/hr over 30 Minutes Intravenous Daily 03/01/19 1112 03/03/19 0959   02/27/19 1530  erythromycin 250 mg in sodium chloride 0.9 % 100 mL IVPB     250 mg 100 mL/hr over 60 Minutes Intravenous Every 8 hours 02/27/19 1442     02/26/19 1000  remdesivir 100 mg in sodium chloride 0.9 % 100 mL IVPB  Status:  Discontinued     100 mg 200 mL/hr over 30 Minutes Intravenous Daily 02/25/19 0031 03/01/19 1113   02/25/19 0200  remdesivir 200 mg in sodium chloride 0.9% 250 mL IVPB     200 mg 580 mL/hr over 30 Minutes Intravenous Once 02/25/19 0031 02/25/19 1900      Inpatient Medications  Scheduled Meds: . atorvastatin  40 mg Oral Daily  . calcitRIOL  1.5 mcg Oral Daily  . calcium acetate  2,001 mg Oral TID WC  . Chlorhexidine Gluconate Cloth  6 each Topical Daily  . clopidogrel  75 mg Oral Q breakfast  . dexamethasone (DECADRON) injection  6 mg Intravenous Q24H  . feeding supplement (NEPRO CARB STEADY)  237 mL Oral TID BM  . feeding supplement (PRO-STAT SUGAR FREE 64)  30 mL Oral BID  . ferrous sulfate  325 mg Oral Q breakfast  . gentamicin cream  1 application Topical Daily  . heparin  5,000 Units Subcutaneous Q8H  . insulin aspart  0-6 Units Subcutaneous TID WC  . isosorbide mononitrate  30 mg Oral Daily  . lidocaine      . lidocaine      . metoCLOPramide (REGLAN) injection  10 mg Intravenous Q8H  . metoprolol succinate  50 mg Oral Daily  . mometasone-formoterol  2 puff Inhalation BID  . pantoprazole (PROTONIX) IV  40 mg Intravenous Daily  . sodium chloride flush  3 mL Intravenous Once   Continuous Infusions: . dialysis solution 1.5% low-MG/low-CA    . dialysis  solution 2.5% low-MG/low-CA    . erythromycin 250 mg (02/27/19 1532)  . remdesivir 100 mg in NS 100 mL 100 mg (03/01/19 1238)   PRN Meds:.acetaminophen **OR** acetaminophen, albuterol, calcium acetate, dianeal solution for CAPD/CCPD with heparin, dianeal solution for CAPD/CCPD with heparin,  hydrALAZINE, HYDROcodone-acetaminophen, lidocaine, montelukast, ondansetron (ZOFRAN) IV   Time Spent in minutes  25  See all Orders from today for further details   Oren Binet M.D on 03/01/2019 at 1:49 PM  To page go to [email or web address] - use universal password  [facility] -  Office  [phone]    Objective:   Vitals:   03/01/19 0648 03/01/19 0848 03/01/19 0848 03/01/19 1218  BP: (!) 158/70 133/63 133/63 (!) 170/76  Pulse: 82 88 85 89  Resp: 17  17   Temp: 99.3 F (37.4 C)  98.9 F (37.2 C) 98.1 F (36.7 C)  TempSrc: Oral     SpO2: 100%  98% 97%  Weight: 65.7 kg     Height:        Wt Readings from Last 3 Encounters:  03/01/19 65.7 kg  02/05/19 66.9 kg  01/26/19 69.4 kg     Intake/Output Summary (Last 24 hours) at 03/01/2019 1349 Last data filed at 03/01/2019 0600 Gross per 24 hour  Intake 420 ml  Output 0 ml  Net 420 ml     Physical Exam Gen Exam:Alert awake-not in any distress HEENT:atraumatic, normocephalic Chest: B/L clear to auscultation anteriorly CVS:S1S2 regular Abdomen:soft-very minimal tenderness in the epigastric area-no peritoneal signs. Extremities:no edema Neurology: Non focal Skin: no rash   Data Review:    CBC Recent Labs  Lab 02/24/19 1622 02/26/19 1109 02/28/19 0636  WBC 7.4 3.2* 7.6  HGB 10.9* 9.6* 9.3*  HCT 36.2 30.9* 30.9*  PLT 439* 348 341  MCV 86.4 83.7 84.2  MCH 26.0 26.0 25.3*  MCHC 30.1 31.1 30.1  RDW 15.1 14.9 14.9    Chemistries  Recent Labs  Lab 02/24/19 1622 02/25/19 0410 02/26/19 1109 02/27/19 1237 02/28/19 0636  NA 141 138 138 137 137  K 5.4* 5.7* 5.4* 5.4* 4.0  CL 101 102 102 103 101  CO2 20* 20* 17* 17* 19*  GLUCOSE 74 168* 82 111* 88  BUN 78* 76* 67* 71* 66*  CREATININE 15.56* 14.75* 12.60* 12.86* 11.82*  CALCIUM 8.9 8.0* 8.8* 8.2* 7.7*  AST 16  --   --   --   --   ALT 16  --   --   --   --   ALKPHOS 93  --   --   --   --   BILITOT 1.1  --   --   --   --     ------------------------------------------------------------------------------------------------------------------ No results for input(s): CHOL, HDL, LDLCALC, TRIG, CHOLHDL, LDLDIRECT in the last 72 hours.  Lab Results  Component Value Date   HGBA1C 7.5 (H) 12/26/2018   ------------------------------------------------------------------------------------------------------------------ No results for input(s): TSH, T4TOTAL, T3FREE, THYROIDAB in the last 72 hours.  Invalid input(s): FREET3 ------------------------------------------------------------------------------------------------------------------ No results for input(s): VITAMINB12, FOLATE, FERRITIN, TIBC, IRON, RETICCTPCT in the last 72 hours.  Coagulation profile No results for input(s): INR, PROTIME in the last 168 hours.  No results for input(s): DDIMER in the last 72 hours.  Cardiac Enzymes No results for input(s): CKMB, TROPONINI, MYOGLOBIN in the last 168 hours.  Invalid input(s): CK ------------------------------------------------------------------------------------------------------------------    Component Value Date/Time   BNP 29.5 07/06/2016 0058    Micro Results Recent Results (from the past 240 hour(s))  Respiratory Panel by RT PCR (Flu A&B, Covid) - Nasopharyngeal Swab     Status: Abnormal   Collection Time: 02/24/19  8:58 PM   Specimen: Nasopharyngeal Swab  Result Value Ref Range Status   SARS Coronavirus 2 by RT PCR POSITIVE (A) NEGATIVE Final    Comment: RESULT CALLED TO, READ BACK BY AND VERIFIED WITH: A. CAIN,RN 0018 02/25/2019 T. TYSOR (NOTE) SARS-CoV-2 target nucleic acids are DETECTED. SARS-CoV-2 RNA is generally detectable in upper respiratory specimens  during the acute phase of infection. Positive results are indicative of the presence of the identified virus, but do not rule out bacterial infection or co-infection with other pathogens not detected by the test. Clinical correlation with  patient history and other diagnostic information is necessary to determine patient infection status. The expected result is Negative. Fact Sheet for Patients:  PinkCheek.be Fact Sheet for Healthcare Providers: GravelBags.it This test is not yet approved or cleared by the [place] FDA and  has been authorized for detection and/or diagnosis of SARS-CoV-2 by FDA under an Emergency Use Authorization (EUA).  This EUA will remain in effect (meaning this test can be used) f or the duration of  the COVID-19 declaration under Section 564(b)(1) of the Act, 21 U.S.C. section 360bbb-3(b)(1), unless the authorization is terminated or revoked sooner.    Influenza A by PCR NEGATIVE NEGATIVE Final   Influenza B by PCR NEGATIVE NEGATIVE Final    Comment: (NOTE) The Xpert Xpress SARS-CoV-2/FLU/RSV assay is intended as an aid in  the diagnosis of influenza from Nasopharyngeal swab specimens and  should not be used as a sole basis for treatment. Nasal washings and  aspirates are unacceptable for Xpert Xpress SARS-CoV-2/FLU/RSV  testing. Fact Sheet for Patients: PinkCheek.be Fact Sheet for Healthcare Providers: GravelBags.it This test is not yet approved or cleared by the [place] FDA and  has been authorized for detection and/or diagnosis of SARS-CoV-2 by  FDA under an Emergency Use Authorization (EUA). This EUA will remain  in effect (meaning this test can be used) for the duration of the  Covid-19 declaration under Section 564(b)(1) of the Act, 21  U.S.C. section 360bbb-3(b)(1), unless the authorization is  terminated or revoked. Performed at [facility], [place] [address], [place], [place] [address]     Radiology Reports CT ABDOMEN PELVIS WO CONTRAST  Result Date: 02/24/2019 CLINICAL DATA:  Generalized abdominal pain with vomiting EXAM: CT ABDOMEN AND PELVIS WITHOUT  CONTRAST TECHNIQUE: Multidetector CT imaging of the abdomen and pelvis was performed following the standard protocol without IV contrast. COMPARISON:  CT 12/27/2018, 12/25/2018, MRI 01/10/2019 FINDINGS: Lower chest: Lung bases demonstrate scattered foci of ground-glass density and patchy consolidation mostly at the periphery of the lingula and left greater than right lung base. No pleural effusion. Heart size within normal limits. Hepatobiliary: No focal liver abnormality is seen. Status post cholecystectomy. No biliary dilatation. Pancreas: Unremarkable. No pancreatic ductal dilatation or surrounding inflammatory changes. Spleen: Normal in size without focal abnormality. Adrenals/Urinary Tract: Adrenal glands are normal. Nonspecific perinephric fat stranding. No hydronephrosis. Stomach/Bowel: Stomach is nonenlarged. No dilated small bowel. History of prior appendectomy. No bowel wall thickening. Vascular/Lymphatic: Extensive atherosclerosis. No aneurysmal dilatation. Subcentimeter retroperitoneal nodes. Reproductive: Status post hysterectomy. No adnexal masses. Other: Negative for free air or free fluid. Peritoneal dialysis catheter in the left lower quadrant. Musculoskeletal: No acute osseous abnormality. Deep wound with soft tissue thickening and small gas pockets superficial to the sacrococcygeal region at the site of prior rim enhancing fluid  collection. IMPRESSION: 1. Patchy foci of ground-glass density and consolidation at the peripheral lung bases and lingula suspect for multifocal infection/possible atypical or viral pneumonia. 2. Deep wound superficial to the sacrococcygeal region with soft tissue thickening and small gas locules with probable small amount of surgical change in the region. No definitive osseous destruction of the underlying sacrococcygeal bone to suggest osteomyelitis by CT. Electronically Signed   By: Donavan Foil M.D.   On: 02/24/2019 19:25   IR Fluoro Guide CV Line Right  Result  Date: 03/01/2019 INDICATION: 49 year old with end-stage renal disease on peritoneal dialysis. Patient has COVID-19 and poor venous access. Request for central line placement. EXAM: FLUOROSCOPIC AND ULTRASOUND GUIDED PLACEMENT OF A NON-TUNNELED CENTRAL VENOUS CATHETER Physician: Stephan Minister. Henn, MD MEDICATIONS: None ANESTHESIA/SEDATION: None FLUOROSCOPY TIME:  Fluoroscopy Time: 18 seconds, 1 mGy COMPLICATIONS: None immediate. PROCEDURE: The procedure was explained to the patient. The risks and benefits of the procedure were discussed and the patient's questions were addressed. Informed consent was obtained from the patient. The patient was placed supine on the interventional table. Ultrasound confirmed a patent right internal jugular vein. Ultrasound images were obtained for documentation. The right neck was prepped and draped in a sterile fashion. The right neck was anesthetized with 1% lidocaine. Maximal barrier sterile technique was utilized including caps, mask, sterile gowns, sterile gloves, sterile drape, hand hygiene and skin antiseptic. A small incision was made with #11 blade scalpel. A 21 gauge needle directed into the right internal jugular vein with ultrasound guidance. Wire was advanced into the central venous system. Peel-away sheath was placed. A dual lumen Power PICC line was cut to 14 cm. The catheter was advanced through the peel-away sheath and positioned at the superior cavoatrial junction. Both lumens aspirated and flushed well. Both lumens were flushed with saline. Both lumens were capped and clamped. Catheter was sutured to skin. Dressing was placed. FINDINGS: Catheter tip at the superior cavoatrial junction. IMPRESSION: Successful placement of a non tunneled central venous catheter with ultrasound and fluoroscopic guidance. Electronically Signed   By: Markus Daft M.D.   On: 03/01/2019 13:11   IR US Guide Vasc Access Right  Result Date: 03/01/2019 INDICATION: 49 year old with end-stage renal  disease on peritoneal dialysis. Patient has COVID-19 and poor venous access. Request for central line placement. EXAM: FLUOROSCOPIC AND ULTRASOUND GUIDED PLACEMENT OF A NON-TUNNELED CENTRAL VENOUS CATHETER Physician: Stephan Minister. Henn, MD MEDICATIONS: None ANESTHESIA/SEDATION: None FLUOROSCOPY TIME:  Fluoroscopy Time: 18 seconds, 1 mGy COMPLICATIONS: None immediate. PROCEDURE: The procedure was explained to the patient. The risks and benefits of the procedure were discussed and the patient's questions were addressed. Informed consent was obtained from the patient. The patient was placed supine on the interventional table. Ultrasound confirmed a patent right internal jugular vein. Ultrasound images were obtained for documentation. The right neck was prepped and draped in a sterile fashion. The right neck was anesthetized with 1% lidocaine. Maximal barrier sterile technique was utilized including caps, mask, sterile gowns, sterile gloves, sterile drape, hand hygiene and skin antiseptic. A small incision was made with #11 blade scalpel. A 21 gauge needle directed into the right internal jugular vein with ultrasound guidance. Wire was advanced into the central venous system. Peel-away sheath was placed. A dual lumen Power PICC line was cut to 14 cm. The catheter was advanced through the peel-away sheath and positioned at the superior cavoatrial junction. Both lumens aspirated and flushed well. Both lumens were flushed with saline. Both lumens were capped and clamped.  Catheter was sutured to skin. Dressing was placed. FINDINGS: Catheter tip at the superior cavoatrial junction. IMPRESSION: Successful placement of a non tunneled central venous catheter with ultrasound and fluoroscopic guidance. Electronically Signed   By: Markus Daft M.D.   On: 03/01/2019 13:11   DG Chest Portable 1 View  Result Date: 02/24/2019 CLINICAL DATA:  Shortness of breath, abdominal pain, vomiting EXAM: PORTABLE CHEST 1 VIEW COMPARISON:  09/16/2018  FINDINGS: Lungs are clear.  No pleural effusion or pneumothorax. The heart is normal in size. IMPRESSION: No evidence of acute cardiopulmonary disease. Electronically Signed   By: Julian Hy M.D.   On: 02/24/2019 21:06

## 2019-03-01 NOTE — Progress Notes (Signed)
  [facility] Progress Note   Assessment/ Plan:   Outpt PD: PD orders CCPD, 7X Week, EDW 75.5 (kg) Exchanges 5, fill vol 2000 mL, Dwell Time 1 hrs 30 min, last fill vol 1000 mL, Day Exchanges 0, Day Dwell Time 0 hrs 0 min   Assessment/ Plan: 1. Nausea/ vomiting - per primary, poss d/t COVID infection, also h/o gastroparesis. Some mild epigastric pain but not really as tender as expected for peritonitis.  Cell ct negative, culture pending. 2. ESRD - on PD. Plan PD nightly while here.  Normal prescription.  Tried to decrease to 1.5 L dwells here but no evidence that that helped.  Increase back to 2L. 3. Hyperkalemia - resolved, stop lokelma 4. COVID+ PNA - w/ some GG changes on CT, getting remdesivir, decadron, no IV abx 5. DM on insulin 6. HTN/vol - no overall gross volume 7. CAD h/o stent 8. Dispo: pending  Subjective:    Cell ct and culture with no evidence of peritonitis.  Going for IV access today   Objective:   BP 133/63   Pulse 85   Temp 98.9 F (37.2 C)   Resp 17   Ht 5\' 5"  (1.651 m)   Wt 65.7 kg   SpO2 98%   BMI 24.10 kg/m   Physical Exam: Gen: lying in bed, NAD CVS: RRR Resp: clear bilaterally no c/w/r Abd: soft, mild epigastric pain, PD cath in place Ext: no LE edema  Labs: BMET Recent Labs  Lab 02/24/19 1622 02/25/19 0410 02/26/19 1109 02/27/19 1237 02/28/19 0636  NA 141 138 138 137 137  K 5.4* 5.7* 5.4* 5.4* 4.0  CL 101 102 102 103 101  CO2 20* 20* 17* 17* 19*  GLUCOSE 74 168* 82 111* 88  BUN 78* 76* 67* 71* 66*  CREATININE 15.56* 14.75* 12.60* 12.86* 11.82*  CALCIUM 8.9 8.0* 8.8* 8.2* 7.7*  PHOS  --   --  9.7* 10.1* 7.9*   CBC Recent Labs  Lab 02/24/19 1622 02/26/19 1109 02/28/19 0636  WBC 7.4 3.2* 7.6  HGB 10.9* 9.6* 9.3*  HCT 36.2 30.9* 30.9*  MCV 86.4 83.7 84.2  PLT 439* 348 341      Medications:    . atorvastatin  40 mg Oral Daily  . calcitRIOL  1.5 mcg Oral Daily  . calcium acetate  2,001 mg Oral TID WC  .  clopidogrel  75 mg Oral Q breakfast  . dexamethasone (DECADRON) injection  6 mg Intravenous Q24H  . feeding supplement (NEPRO CARB STEADY)  237 mL Oral TID BM  . feeding supplement (PRO-STAT SUGAR FREE 64)  30 mL Oral BID  . ferrous sulfate  325 mg Oral Q breakfast  . gentamicin cream  1 application Topical Daily  . heparin  5,000 Units Subcutaneous Q8H  . insulin aspart  0-6 Units Subcutaneous TID WC  . isosorbide mononitrate  30 mg Oral Daily  . lidocaine      . lidocaine      . metoCLOPramide (REGLAN) injection  10 mg Intravenous Q8H  . metoprolol succinate  50 mg Oral Daily  . mometasone-formoterol  2 puff Inhalation BID  . pantoprazole (PROTONIX) IV  40 mg Intravenous Daily  . sodium chloride flush  3 mL Intravenous Once     Madelon Lips, MD 03/01/2019, 11:44 AM

## 2019-03-02 DIAGNOSIS — E1142 Type 2 diabetes mellitus with diabetic polyneuropathy: Secondary | ICD-10-CM

## 2019-03-02 DIAGNOSIS — E1165 Type 2 diabetes mellitus with hyperglycemia: Secondary | ICD-10-CM

## 2019-03-02 LAB — RENAL FUNCTION PANEL
Albumin: 1.9 g/dL — ABNORMAL LOW (ref 3.5–5.0)
Anion gap: 14 (ref 5–15)
BUN: 67 mg/dL — ABNORMAL HIGH (ref 6–20)
CO2: 21 mmol/L — ABNORMAL LOW (ref 22–32)
Calcium: 7.5 mg/dL — ABNORMAL LOW (ref 8.9–10.3)
Chloride: 103 mmol/L (ref 98–111)
Creatinine, Ser: 11.52 mg/dL — ABNORMAL HIGH (ref 0.44–1.00)
GFR calc Af Amer: 4 mL/min — ABNORMAL LOW (ref >60)
GFR calc non Af Amer: 3 mL/min — ABNORMAL LOW (ref >60)
Glucose, Bld: 114 mg/dL — ABNORMAL HIGH (ref 70–99)
Phosphorus: 7.2 mg/dL — ABNORMAL HIGH (ref 2.5–4.6)
Potassium: 4 mmol/L (ref 3.5–5.1)
Sodium: 138 mmol/L (ref 135–145)

## 2019-03-02 LAB — CBC
HCT: 30 % — ABNORMAL LOW (ref 36.0–46.0)
Hemoglobin: 9.1 g/dL — ABNORMAL LOW (ref 12.0–15.0)
MCH: 25.7 pg — ABNORMAL LOW (ref 26.0–34.0)
MCHC: 30.3 g/dL (ref 30.0–36.0)
MCV: 84.7 fL (ref 80.0–100.0)
Platelets: 310 10*3/uL (ref 150–400)
RBC: 3.54 MIL/uL — ABNORMAL LOW (ref 3.87–5.11)
RDW: 14.8 % (ref 11.5–15.5)
WBC: 8.1 10*3/uL (ref 4.0–10.5)
nRBC: 0 % (ref 0.0–0.2)

## 2019-03-02 LAB — GLUCOSE, CAPILLARY: Glucose-Capillary: 105 mg/dL — ABNORMAL HIGH (ref 70–99)

## 2019-03-02 LAB — GRAM STAIN

## 2019-03-02 LAB — PATHOLOGIST SMEAR REVIEW

## 2019-03-02 MED ORDER — TOUJEO SOLOSTAR 300 UNIT/ML ~~LOC~~ SOPN: 5.0000 [IU] | PEN_INJECTOR | Freq: Two times a day (BID) | SUBCUTANEOUS | 3 refills | Status: DC

## 2019-03-02 MED ORDER — NEPRO/CARBSTEADY PO LIQD: 237.0000 mL | Freq: Three times a day (TID) | ORAL | 0 refills | Status: AC

## 2019-03-02 MED ORDER — ONDANSETRON 4 MG PO TBDP: 4.0000 mg | ORAL_TABLET | Freq: Three times a day (TID) | ORAL | 0 refills | Status: DC | PRN

## 2019-03-02 MED ORDER — INSULIN ASPART 100 UNIT/ML ~~LOC~~ SOLN: SUBCUTANEOUS | 0 refills | Status: DC

## 2019-03-02 NOTE — Progress Notes (Signed)
[facility] Progress Note   Assessment/ Plan:   Outpt PD: PD orders CCPD, 7X Week, EDW 75.5 (kg) Exchanges 5, fill vol 2000 mL, Dwell Time 1 hrs 30 min, last fill vol 1000 mL, Day Exchanges 0, Day Dwell Time 0 hrs 0 min   Assessment/ Plan: 1. Nausea/ vomiting - per primary, poss d/t COVID infection, also h/o gastroparesis. Some mild epigastric pain but not really as tender as expected for peritonitis.  Cell ct negative, culture pending.  If some of this is uremia too. See #2. 2. ESRD - on PD. Plan PD nightly while here.  Normal prescription.  Discussed with primary nephrologist (Dr. Jimmy Footman) and home therapies staff.  Pt has inadequate Kt/V as OP, ideally would do increased prescription as follows: 2000 mL fills, 6 exchanges with last fill 1000 mL.  Dwell time 2 hr, fill time 10 min, drain time 20 min.  She has agreed to do this as OP (this is what she tells me this AM).  She also tells me that she does not consent to converting to in-center hemodialysis which is the other option.  This was discussed with the home therapies staff as well.   3. Hyperkalemia - resolved, stop lokelma 4. COVID+ PNA - w/ some GG changes on CT, s/p getting remdesivir, decadron, no IV abx 5. DM on insulin 6. HTN/vol - no overall gross volume 7. CAD h/o stent 8. Dispo: d/c today  Subjective:    Refused to get hooked up for PD again last night, just "wants to go home".     Objective:   BP (!) 154/73 (BP Location: Right Arm)   Pulse 98   Temp 98.6 F (37 C) (Oral)   Resp 17   Ht 5\' 5"  (1.651 m)   Wt 65.7 kg   SpO2 100%   BMI 24.10 kg/m   Physical Exam: Gen: lying in bed, NAD CVS: RRR Resp: clear bilaterally no c/w/r Abd: soft, mild epigastric pain, PD cath in place Ext: no LE edema  Labs: BMET Recent Labs  Lab 02/24/19 1622 02/25/19 0410 02/26/19 1109 02/27/19 1237 02/28/19 0636 03/01/19 1700 03/02/19 0734  NA 141 138 138 137 137 135 138  K 5.4* 5.7* 5.4* 5.4* 4.0 4.1 4.0   CL 101 102 102 103 101 99 103  CO2 20* 20* 17* 17* 19* 21* 21*  GLUCOSE 74 168* 82 111* 88 101* 114*  BUN 78* 76* 67* 71* 66* 60* 67*  CREATININE 15.56* 14.75* 12.60* 12.86* 11.82* 10.83* 11.52*  CALCIUM 8.9 8.0* 8.8* 8.2* 7.7* 7.4* 7.5*  PHOS  --   --  9.7* 10.1* 7.9* 7.2* 7.2*   CBC Recent Labs  Lab 02/26/19 1109 02/28/19 0636 03/01/19 1700 03/02/19 0734  WBC 3.2* 7.6 7.2 8.1  HGB 9.6* 9.3* 9.9* 9.1*  HCT 30.9* 30.9* 31.8* 30.0*  MCV 83.7 84.2 82.6 84.7  PLT 348 341 328 310      Medications:    . atorvastatin  40 mg Oral Daily  . calcitRIOL  1.5 mcg Oral Daily  . calcium acetate  2,001 mg Oral TID WC  . Chlorhexidine Gluconate Cloth  6 each Topical Daily  . clopidogrel  75 mg Oral Q breakfast  . dexamethasone (DECADRON) injection  6 mg Intravenous Q24H  . feeding supplement (NEPRO CARB STEADY)  237 mL Oral TID BM  . feeding supplement (PRO-STAT SUGAR FREE 64)  30 mL Oral BID  . gentamicin cream  1 application Topical Daily  . heparin  5,000 Units Subcutaneous Q8H  . insulin aspart  0-6 Units Subcutaneous TID WC  . isosorbide mononitrate  30 mg Oral Daily  . metoCLOPramide (REGLAN) injection  10 mg Intravenous TID AC  . metoprolol succinate  50 mg Oral Daily  . mometasone-formoterol  2 puff Inhalation BID  . pantoprazole (PROTONIX) IV  40 mg Intravenous Daily  . polyethylene glycol  17 g Oral Daily  . sodium chloride flush  3 mL Intravenous Once     Madelon Lips, MD 03/02/2019, 1:35 PM

## 2019-03-02 NOTE — Discharge Summary (Signed)
PATIENT DETAILS Name: Susan Horton Age: 49 y.o. Sex: female Date of Birth: 05-08-1970 MRN: [id number]. Admitting Physician: Shela Leff, MD [id number], Charlane Ferretti, MD  Admit Date: 02/24/2019 Discharge date: 03/02/2019  Recommendations for Outpatient Follow-up:  1. Follow up with PCP in 1-2 weeks 2. Please obtain CMP/CBC in one week 3. Please ensure follow-up with GI MD at [facility], nephrology  Admitted From:  Home  Disposition: [place]: No  Equipment/Devices: None  Discharge Condition: Stable  CODE STATUS: FULL CODE  Diet recommendation:  Diet Order            Diet - low sodium heart healthy        Diet Carb Modified        Diet renal/carb modified with fluid restriction Diet-HS Snack? Nothing; Fluid restriction: 1200 mL Fluid; Room service appropriate? Yes; Fluid consistency: Thin  Diet effective now               Brief Summary: See H&P, Labs, Consult and Test reports for all details in brief,Patient is a 49 y.o. female with PMHx of ESRD-on PD, IDDM, HTN, gastroparesis, GERD, CAD s/p PCI, history of sacral abscess-presenting with abdominal pain and vomiting-found to have COVID-19 along with exacerbation of underlying diabetic gastroparesis.  See below for further details.  Brief Hospital Course: Intractable nausea/vomiting with epigastric pain: Likely secondary to gastroparesis flare provoked by COVID-19-she has a longstanding history of gastroparesis and follows with GI MD at [facility].  CT abdomen without any acute abnormalities-lipase levels were within normal limits.  Peritoneal dialysis fluid count was inconsistent with peritonitis-cultures are negative so far.  She was treated with IV Reglan-she was also started on IV erythromycin however she refused.  She appears to have significant amount of chronic nausea and vomiting at baseline-and is on oral Reglan 4 times a day.  Her abdomen is soft-she does  continue to complain of mild periumbilical/epigastric pain at times.  She feels better this morning-she has now started to eat some-she is really anxious to go home-she will continue with scheduled Reglan/PPI as previous-and continue to consume small quantity meals for gastroparesis.  We will add as needed Zofran for intractable nausea/vomiting on top of her chronic symptoms.  She has been asked to follow with a gastroenterologist at [facility].    Covid 19 Viral pneumonia: Improved-on room air-she will complete her last dose of remdesivir today-she does not need steroids on discharge as she is not hypoxic.    COVID-19 Labs:  No results for input(s): DDIMER, FERRITIN, LDH, CRP in the last 72 hours.  Lab Results  Component Value Date   SARSCOV2NAA POSITIVE (A) 02/24/2019   [place] NEGATIVE 01/10/2019   [place] NEGATIVE 12/25/2018   [place] NEGATIVE 12/12/2018     COVID-19 Medications: COVID-19 Medications: Steroids:2/13>>2/19 Remdesivir:2/13>>2/19  ESRD: On PD-nephrology followed and directed care-however patient has refused PD at times during this hospital stay.  History of CAD: No anginal symptoms-continue antiplatelet agents, beta-blocker and statin  IDDM-2:  CBGs were stable-she had poor oral intake-however her oral intake is slowly improving.  We will decrease her Lantus to 5 units twice daily-change her to a very sensitive SSI regimen.  Follow with her primary care practitioner further optimization.  HTN: Controlled-continue metoprolol, Imdur  History of sacral abscess: Wound care following-no active abscess since clinically.  Nutrition Problem: Nutrition Problem: Increased nutrient needs Etiology: chronic illness(ESRD PD, CHF) Signs/Symptoms: estimated needs Interventions: Nepro shake, Prostat  Procedures/Studies: None  Discharge Diagnoses:  Principal Problem:   Intractable nausea and vomiting Active Problems:   Gastroparesis    Essential hypertension, benign   Epigastric abdominal pain   Multifocal pneumonia   Discharge Instructions:    Person Under Monitoring Name: ALEENA KIRKEBY  Location: [address] [place] [address]   Infection Prevention Recommendations for Individuals Confirmed to have, or Being Evaluated for, 2019 Novel Coronavirus (COVID-19) Infection Who Receive Care at Home  Individuals who are confirmed to have, or are being evaluated for, COVID-19 should follow the prevention steps below until a healthcare provider or local or state health department says they can return to normal activities.  Stay home except to get medical care You should restrict activities outside your home, except for getting medical care. Do not go to work, school, or public areas, and do not use public transportation or taxis.  Call ahead before visiting your doctor Before your medical appointment, call the healthcare provider and tell them that you have, or are being evaluated for, COVID-19 infection. This will help the healthcare provider's office take steps to keep other people from getting infected. Ask your healthcare provider to call the local or state health department.  Monitor your symptoms Seek prompt medical attention if your illness is worsening (e.g., difficulty breathing). Before going to your medical appointment, call the healthcare provider and tell them that you have, or are being evaluated for, COVID-19 infection. Ask your healthcare provider to call the local or state health department.  Wear a facemask You should wear a facemask that covers your nose and mouth when you are in the same room with other people and when you visit a healthcare provider. People who live with or visit you should also wear a facemask while they are in the same room with you.  Separate yourself from other people in your home As much as possible, you should stay in a different room from other people in your  home. Also, you should use a separate bathroom, if available.  Avoid sharing household items You should not share dishes, drinking glasses, cups, eating utensils, towels, bedding, or other items with other people in your home. After using these items, you should wash them thoroughly with soap and water.  Cover your coughs and sneezes Cover your mouth and nose with a tissue when you cough or sneeze, or you can cough or sneeze into your sleeve. Throw used tissues in a lined trash can, and immediately wash your hands with soap and water for at least 20 seconds or use an alcohol-based hand rub.  Wash your [employer] your hands often and thoroughly with soap and water for at least 20 seconds. You can use an alcohol-based hand sanitizer if soap and water are not available and if your hands are not visibly dirty. Avoid touching your eyes, nose, and mouth with unwashed hands.   Prevention Steps for Caregivers and Household Members of Individuals Confirmed to have, or Being Evaluated for, COVID-19 Infection Being Cared for in the Home  If you live with, or provide care at home for, a person confirmed to have, or being evaluated for, COVID-19 infection please follow these guidelines to prevent infection:  Follow healthcare provider's instructions Make sure that you understand and can help the patient follow any healthcare provider instructions for all care.  Provide for the patient's basic needs You should help the patient with basic needs in the home and provide support for getting groceries, prescriptions,  and other personal needs.  Monitor the patient's symptoms If they are getting sicker, call his or her medical provider and tell them that the patient has, or is being evaluated for, COVID-19 infection. This will help the healthcare provider's office take steps to keep other people from getting infected. Ask the healthcare provider to call the local or state health department.  Limit the  number of people who have contact with the patient  If possible, have only one caregiver for the patient.  Other household members should stay in another home or place of residence. If this is not possible, they should stay  in another room, or be separated from the patient as much as possible. Use a separate bathroom, if available.  Restrict visitors who do not have an essential need to be in the home.  Keep older adults, very young children, and other sick people away from the patient Keep older adults, very young children, and those who have compromised immune systems or chronic health conditions away from the patient. This includes people with chronic heart, lung, or kidney conditions, diabetes, and cancer.  Ensure good ventilation Make sure that shared spaces in the home have good air flow, such as from an air conditioner or an opened window, weather permitting.  Wash your hands often  Wash your hands often and thoroughly with soap and water for at least 20 seconds. You can use an alcohol based hand sanitizer if soap and water are not available and if your hands are not visibly dirty.  Avoid touching your eyes, nose, and mouth with unwashed hands.  Use disposable paper towels to dry your hands. If not available, use dedicated cloth towels and replace them when they become wet.  Wear a facemask and gloves  Wear a disposable facemask at all times in the room and gloves when you touch or have contact with the patient's blood, body fluids, and/or secretions or excretions, such as sweat, saliva, sputum, nasal mucus, vomit, urine, or feces.  Ensure the mask fits over your nose and mouth tightly, and do not touch it during use.  Throw out disposable facemasks and gloves after using them. Do not reuse.  Wash your hands immediately after removing your facemask and gloves.  If your personal clothing becomes contaminated, carefully remove clothing and launder. Wash your hands after  handling contaminated clothing.  Place all used disposable facemasks, gloves, and other waste in a lined container before disposing them with other household waste.  Remove gloves and wash your hands immediately after handling these items.  Do not share dishes, glasses, or other household items with the patient  Avoid sharing household items. You should not share dishes, drinking glasses, cups, eating utensils, towels, bedding, or other items with a patient who is confirmed to have, or being evaluated for, COVID-19 infection.  After the person uses these items, you should wash them thoroughly with soap and water.  Wash laundry thoroughly  Immediately remove and wash clothes or bedding that have blood, body fluids, and/or secretions or excretions, such as sweat, saliva, sputum, nasal mucus, vomit, urine, or feces, on them.  Wear gloves when handling laundry from the patient.  Read and follow directions on labels of laundry or clothing items and detergent. In general, wash and dry with the warmest temperatures recommended on the label.  Clean all areas the individual has used often  Clean all touchable surfaces, such as counters, tabletops, doorknobs, bathroom fixtures, toilets, phones, keyboards, tablets, and bedside tables, every day.  Also, clean any surfaces that may have blood, body fluids, and/or secretions or excretions on them.  Wear gloves when cleaning surfaces the patient has come in contact with.  Use a diluted bleach solution (e.g., dilute bleach with 1 part bleach and 10 parts water) or a household disinfectant with a label that says EPA-registered for coronaviruses. To make a bleach solution at home, add 1 tablespoon of bleach to 1 quart (4 cups) of water. For a larger supply, add  cup of bleach to 1 gallon (16 cups) of water.  Read labels of cleaning products and follow recommendations provided on product labels. Labels contain instructions for safe and effective use of the  cleaning product including precautions you should take when applying the product, such as wearing gloves or eye protection and making sure you have good ventilation during use of the product.  Remove gloves and wash hands immediately after cleaning.  Monitor yourself for signs and symptoms of illness Caregivers and household members are considered close contacts, should monitor their health, and will be asked to limit movement outside of the home to the extent possible. Follow the monitoring steps for close contacts listed on the symptom monitoring form.   ? If you have additional questions, contact your local health department or call the epidemiologist on call at [phone] (available 24/7). ? This guidance is subject to change. For the most up-to-date guidance from CDC, please refer to their website: YouBlogs.pl    Activity:  As tolerated   Discharge Instructions    Call MD for:  extreme fatigue   Complete by: As directed    Call MD for:  persistant dizziness or light-headedness   Complete by: As directed    Call MD for:  persistant nausea and vomiting   Complete by: As directed    Call MD for:  severe uncontrolled pain   Complete by: As directed    Diet - low sodium heart healthy   Complete by: As directed    Diet Carb Modified   Complete by: As directed    Discharge instructions   Complete by: As directed    1.)  3 weeks of isolation from 02/24/2019  2.)  Small frequent meals as you have gastroparesis-follow with the GI MD at [facility]   Follow with Primary MD  Charlott Rakes, MD in 1-2 weeks  With your primary gastroenterologist at [facility]  Please get a complete blood count and chemistry panel checked by your Primary MD at your next visit, and again as instructed by your Primary MD.  Get Medicines reviewed and adjusted: Please take all your medications with you  for your next visit with your Primary MD  Laboratory/radiological data: Please request your Primary MD to go over all hospital tests and procedure/radiological results at the follow up, please ask your Primary MD to get all Hospital records sent to his/her office.  In some cases, they will be blood work, cultures and biopsy results pending at the time of your discharge. Please request that your primary care M.D. follows up on these results.  Also Note the following: If you experience worsening of your admission symptoms, develop shortness of breath, life threatening emergency, suicidal or homicidal thoughts you must seek medical attention immediately by calling 911 or calling your MD immediately  if symptoms less severe.  You must read complete instructions/literature along with all the possible adverse reactions/side effects for all the Medicines you take and that have been prescribed to  you. Take any new Medicines after you have completely understood and accpet all the possible adverse reactions/side effects.   Do not drive when taking Pain medications or sleeping medications (Benzodaizepines)  Do not take more than prescribed Pain, Sleep and Anxiety Medications. It is not advisable to combine anxiety,sleep and pain medications without talking with your primary care practitioner  Special Instructions: If you have smoked or chewed Tobacco  in the last 2 yrs please stop smoking, stop any regular Alcohol  and or any Recreational drug use.  Wear Seat belts while driving.  Please note: You were cared for by a hospitalist during your hospital stay. Once you are discharged, your primary care physician will handle any further medical issues. Please note that NO REFILLS for any discharge medications will be authorized once you are discharged, as it is imperative that you return to your primary care physician (or establish a relationship with a primary care physician if you do not have one) for your  post hospital discharge needs so that they can reassess your need for medications and monitor your lab values.   Increase activity slowly   Complete by: As directed      Allergies as of 03/02/2019      Reactions   Repatha [evolocumab] Itching   Lisinopril Cough      Medication List    STOP taking these medications   fluconazole 150 MG tablet Commonly known as: Diflucan   insulin regular 100 units/mL injection Commonly known as: HumuLIN R   mupirocin cream 2 % Commonly known as: BACTROBAN     TAKE these medications   albuterol 108 (90 Base) MCG/ACT inhaler Commonly known as: VENTOLIN HFA Inhale 1-2 puffs into the lungs every 6 (six) hours as needed for wheezing or shortness of breath.   amitriptyline 10 MG tablet Commonly known as: ELAVIL Take 1 tablet (10 mg total) by mouth at bedtime.   aspirin 81 MG chewable tablet Chew 1 tablet (81 mg total) by mouth daily.   atorvastatin 40 MG tablet Commonly known as: LIPITOR Take 1 tablet (40 mg total) by mouth daily.   calcitRIOL 0.5 MCG capsule Commonly known as: ROCALTROL Take 1.5 mcg by mouth daily.   calcium acetate 667 MG capsule Commonly known as: PHOSLO Take 1,334-2,001 mg by mouth See admin instructions. Take three capsules (2001 mg) by mouth up to three times daily with meals and two capsules (1334 mg) with snacks   clopidogrel 75 MG tablet Commonly known as: PLAVIX Take 1 tablet (75 mg total) by mouth daily with breakfast.   DIALYVITE 800 WITH ZINC 0.8 MG Tabs Take 1 tablet by mouth daily.   feeding supplement (NEPRO CARB STEADY) Liqd Take 237 mLs by mouth 3 (three) times daily between meals.   Fluticasone-Salmeterol 250-50 MCG/DOSE Aepb Commonly known as: ADVAIR Inhale 1 puff into the lungs 2 (two) times daily. What changed:   when to take this  reasons to take this   gentamicin cream 0.1 % Commonly known as: GARAMYCIN Apply 1 application topically See admin instructions. Apply to access site  nightly   insulin aspart 100 UNIT/ML injection Commonly known as: novoLOG 0-6 Units, Subcutaneous, 3 times daily with meals CBG < 70: Implement Hypoglycemia measures/call MD CBG 70 - 120: 0 units CBG 121 - 150: 0 units CBG 151 - 200: 1 unit CBG 201-250: 2 units CBG 251-300: 3 units CBG 301-350: 4 units CBG 351-400: 5 units CBG > 400: Give 6 units and call MD  Iron 325 (65 Fe) MG Tabs Take 325 mg by mouth daily.   isosorbide mononitrate 30 MG 24 hr tablet Commonly known as: IMDUR Take 0.5 tablets (15 mg total) by mouth daily.   lidocaine-prilocaine cream Commonly known as: EMLA Apply 1 application topically See admin instructions. Apply small amount to access site (AVF) 30 minutes before hemodialysis, cover with occlusive dressing (Saran Wrap) if going out to dialysis center   methocarbamol 500 MG tablet Commonly known as: Robaxin Take 1 tablet (500 mg total) by mouth 2 (two) times daily as needed for muscle spasms.   metoCLOPramide 5 MG tablet Commonly known as: REGLAN TAKE 1 TABLET BY MOUTH 4 TIMES DAILY BEFORE MEALS AND AT BEDTIME. What changed: See the new instructions.   metoprolol succinate 50 MG 24 hr tablet Commonly known as: TOPROL-XL Take 1 tablet (50 mg total) by mouth daily.   montelukast 10 MG tablet Commonly known as: SINGULAIR Take 1 tablet (10 mg total) by mouth at bedtime. What changed:   when to take this  reasons to take this   nitroGLYCERIN 0.4 MG SL tablet Commonly known as: Nitrostat Place 1 tablet (0.4 mg total) under the tongue every 5 (five) minutes as needed for chest pain.   ondansetron 4 MG disintegrating tablet Commonly known as: Zofran ODT Take 1 tablet (4 mg total) by mouth every 8 (eight) hours as needed for nausea or vomiting.   pantoprazole 40 MG tablet Commonly known as: PROTONIX TAKE 1 TABLET BY MOUTH DAILY.   Praluent 75 MG/ML Soaj Generic drug: Alirocumab Inject 75 mg into the skin every 14 (fourteen) days.   pregabalin 75  MG capsule Commonly known as: Lyrica Take 1 capsule (75 mg total) by mouth daily.   Toujeo SoloStar 300 UNIT/ML Sopn Generic drug: Insulin Glargine (1 Unit Dial) Inject 5 Units into the skin 2 (two) times daily. What changed: how much to take   trimethoprim-polymyxin b ophthalmic solution Commonly known as: POLYTRIM Place 1 drop into both eyes See admin instructions. Instill one drop into both eyes four times daily for 2 days after each monthly eye injection      Follow-up Information    Charlott Rakes, MD. Schedule an appointment as soon as possible for a visit in 2 week(s).   Specialty: Family Medicine Contact information: [place] [address] [phone]        [name], MD .   Specialties: Cardiology, Radiology Contact information: [address] [place] [address] [phone]        Gastroetnerology MD. Schedule an appointment as soon as possible for a visit in 2 week(s).   Why: at [facility]         Allergies  Allergen Reactions  . Repatha [Evolocumab] Itching  . Lisinopril Cough    Consultations:   nephrology   Other Procedures/Studies: CT ABDOMEN PELVIS WO CONTRAST  Result Date: 02/24/2019 CLINICAL DATA:  Generalized abdominal pain with vomiting EXAM: CT ABDOMEN AND PELVIS WITHOUT CONTRAST TECHNIQUE: Multidetector CT imaging of the abdomen and pelvis was performed following the standard protocol without IV contrast. COMPARISON:  CT 12/27/2018, 12/25/2018, MRI 01/10/2019 FINDINGS: Lower chest: Lung bases demonstrate scattered foci of ground-glass density and patchy consolidation mostly at the periphery of the lingula and left greater than right lung base. No pleural effusion. Heart size within normal limits. Hepatobiliary: No focal liver abnormality is seen. Status post cholecystectomy. No biliary dilatation. Pancreas: Unremarkable. No pancreatic ductal dilatation or surrounding inflammatory  changes. Spleen: Normal in size without focal abnormality. Adrenals/Urinary Tract: Adrenal glands are normal. Nonspecific perinephric fat stranding. No hydronephrosis. Stomach/Bowel: Stomach is nonenlarged. No dilated small bowel. History of prior appendectomy. No bowel wall thickening. Vascular/Lymphatic: Extensive atherosclerosis. No aneurysmal dilatation. Subcentimeter retroperitoneal nodes. Reproductive: Status post hysterectomy. No adnexal masses. Other: Negative for free air or free fluid. Peritoneal dialysis catheter in the left lower quadrant. Musculoskeletal: No acute osseous abnormality. Deep wound with soft tissue thickening and small gas pockets superficial to the sacrococcygeal region at the site of prior rim enhancing fluid collection. IMPRESSION: 1. Patchy foci of ground-glass density and consolidation at the peripheral lung bases and lingula suspect for multifocal infection/possible atypical or viral pneumonia. 2. Deep wound superficial to the sacrococcygeal region with soft tissue thickening and small gas locules with probable small amount of surgical change in the region. No definitive osseous destruction of the underlying sacrococcygeal bone to suggest osteomyelitis by CT. Electronically Signed   By: Donavan Foil M.D.   On: 02/24/2019 19:25   IR Fluoro Guide CV Line Right  Result Date: 03/01/2019 INDICATION: 49 year old with end-stage renal disease on peritoneal dialysis. Patient has COVID-19 and poor venous access. Request for central line placement. EXAM: FLUOROSCOPIC AND ULTRASOUND GUIDED PLACEMENT OF A NON-TUNNELED CENTRAL VENOUS CATHETER Physician: Stephan Minister. Henn, MD MEDICATIONS: None ANESTHESIA/SEDATION: None FLUOROSCOPY TIME:  Fluoroscopy Time: 18 seconds, 1 mGy COMPLICATIONS: None immediate. PROCEDURE: The procedure was explained to the patient. The risks and benefits of the procedure were discussed and the patient's questions were addressed. Informed consent was obtained from the  patient. The patient was placed supine on the interventional table. Ultrasound confirmed a patent right internal jugular vein. Ultrasound images were obtained for documentation. The right neck was prepped and draped in a sterile fashion. The right neck was anesthetized with 1% lidocaine. Maximal barrier sterile technique was utilized including caps, mask, sterile gowns, sterile gloves, sterile drape, hand hygiene and skin antiseptic. A small incision was made with #11 blade scalpel. A 21 gauge needle directed into the right internal jugular vein with ultrasound guidance. Wire was advanced into the central venous system. Peel-away sheath was placed. A dual lumen Power PICC line was cut to 14 cm. The catheter was advanced through the peel-away sheath and positioned at the superior cavoatrial junction. Both lumens aspirated and flushed well. Both lumens were flushed with saline. Both lumens were capped and clamped. Catheter was sutured to skin. Dressing was placed. FINDINGS: Catheter tip at the superior cavoatrial junction. IMPRESSION: Successful placement of a non tunneled central venous catheter with ultrasound and fluoroscopic guidance. Electronically Signed   By: Markus Daft M.D.   On: 03/01/2019 13:11   IR US Guide Vasc Access Right  Result Date: 03/01/2019 INDICATION: 49 year old with end-stage renal disease on peritoneal dialysis. Patient has COVID-19 and poor venous access. Request for central line placement. EXAM: FLUOROSCOPIC AND ULTRASOUND GUIDED PLACEMENT OF A NON-TUNNELED CENTRAL VENOUS CATHETER Physician: Stephan Minister. Henn, MD MEDICATIONS: None ANESTHESIA/SEDATION: None FLUOROSCOPY TIME:  Fluoroscopy Time: 18 seconds, 1 mGy COMPLICATIONS: None immediate. PROCEDURE: The procedure was explained to the patient. The risks and benefits of the procedure were discussed and the patient's questions were addressed. Informed consent was obtained from the patient. The patient was placed supine on the interventional  table. Ultrasound confirmed a patent right internal jugular vein. Ultrasound images were obtained for documentation. The right neck was prepped and draped in a sterile fashion. The right neck was anesthetized with 1% lidocaine. Maximal barrier sterile  technique was utilized including caps, mask, sterile gowns, sterile gloves, sterile drape, hand hygiene and skin antiseptic. A small incision was made with #11 blade scalpel. A 21 gauge needle directed into the right internal jugular vein with ultrasound guidance. Wire was advanced into the central venous system. Peel-away sheath was placed. A dual lumen Power PICC line was cut to 14 cm. The catheter was advanced through the peel-away sheath and positioned at the superior cavoatrial junction. Both lumens aspirated and flushed well. Both lumens were flushed with saline. Both lumens were capped and clamped. Catheter was sutured to skin. Dressing was placed. FINDINGS: Catheter tip at the superior cavoatrial junction. IMPRESSION: Successful placement of a non tunneled central venous catheter with ultrasound and fluoroscopic guidance. Electronically Signed   By: Markus Daft M.D.   On: 03/01/2019 13:11   DG Chest Portable 1 View  Result Date: 02/24/2019 CLINICAL DATA:  Shortness of breath, abdominal pain, vomiting EXAM: PORTABLE CHEST 1 VIEW COMPARISON:  09/16/2018 FINDINGS: Lungs are clear.  No pleural effusion or pneumothorax. The heart is normal in size. IMPRESSION: No evidence of acute cardiopulmonary disease. Electronically Signed   By: Julian Hy M.D.   On: 02/24/2019 21:06      TODAY-DAY OF DISCHARGE:  Subjective:   [name] today has no headache,no chest abdominal pain,no new weakness tingling or numbness, feels much better wants to go home today.   Objective:   Blood pressure (!) 154/73, pulse 98, temperature 98.6 F (37 C), temperature source Oral, resp. rate 18, height 5\' 5"  (1.651 m), weight 65.7 kg, SpO2 100 %.  Intake/Output Summary  (Last 24 hours) at 03/02/2019 0953 Last data filed at 03/01/2019 1930 Gross per 24 hour  Intake 250 ml  Output --  Net 250 ml   Filed Weights   02/28/19 0730 02/28/19 1958 03/01/19 0648  Weight: 65.9 kg 66.2 kg 65.7 kg    Exam: Awake Alert, Oriented *3, No new F.N deficits, Normal affect [place].AT,PERRAL Supple Neck,No JVD, No cervical lymphadenopathy appriciated.  Symmetrical Chest wall movement, Good air movement bilaterally, CTAB RRR,No Gallops,Rubs or new Murmurs, No Parasternal Heave +ve B.Sounds, Abd Soft, Non tender, No organomegaly appriciated, No rebound -guarding or rigidity. No Cyanosis, Clubbing or edema, No new Rash or bruise   PERTINENT RADIOLOGIC STUDIES: CT ABDOMEN PELVIS WO CONTRAST  Result Date: 02/24/2019 CLINICAL DATA:  Generalized abdominal pain with vomiting EXAM: CT ABDOMEN AND PELVIS WITHOUT CONTRAST TECHNIQUE: Multidetector CT imaging of the abdomen and pelvis was performed following the standard protocol without IV contrast. COMPARISON:  CT 12/27/2018, 12/25/2018, MRI 01/10/2019 FINDINGS: Lower chest: Lung bases demonstrate scattered foci of ground-glass density and patchy consolidation mostly at the periphery of the lingula and left greater than right lung base. No pleural effusion. Heart size within normal limits. Hepatobiliary: No focal liver abnormality is seen. Status post cholecystectomy. No biliary dilatation. Pancreas: Unremarkable. No pancreatic ductal dilatation or surrounding inflammatory changes. Spleen: Normal in size without focal abnormality. Adrenals/Urinary Tract: Adrenal glands are normal. Nonspecific perinephric fat stranding. No hydronephrosis. Stomach/Bowel: Stomach is nonenlarged. No dilated small bowel. History of prior appendectomy. No bowel wall thickening. Vascular/Lymphatic: Extensive atherosclerosis. No aneurysmal dilatation. Subcentimeter retroperitoneal nodes. Reproductive: Status post hysterectomy. No adnexal masses. Other: Negative for free  air or free fluid. Peritoneal dialysis catheter in the left lower quadrant. Musculoskeletal: No acute osseous abnormality. Deep wound with soft tissue thickening and small gas pockets superficial to the sacrococcygeal region at the site of prior rim enhancing fluid collection. IMPRESSION: 1.  Patchy foci of ground-glass density and consolidation at the peripheral lung bases and lingula suspect for multifocal infection/possible atypical or viral pneumonia. 2. Deep wound superficial to the sacrococcygeal region with soft tissue thickening and small gas locules with probable small amount of surgical change in the region. No definitive osseous destruction of the underlying sacrococcygeal bone to suggest osteomyelitis by CT. Electronically Signed   By: Donavan Foil M.D.   On: 02/24/2019 19:25   IR Fluoro Guide CV Line Right  Result Date: 03/01/2019 INDICATION: 49 year old with end-stage renal disease on peritoneal dialysis. Patient has COVID-19 and poor venous access. Request for central line placement. EXAM: FLUOROSCOPIC AND ULTRASOUND GUIDED PLACEMENT OF A NON-TUNNELED CENTRAL VENOUS CATHETER Physician: Stephan Minister. Henn, MD MEDICATIONS: None ANESTHESIA/SEDATION: None FLUOROSCOPY TIME:  Fluoroscopy Time: 18 seconds, 1 mGy COMPLICATIONS: None immediate. PROCEDURE: The procedure was explained to the patient. The risks and benefits of the procedure were discussed and the patient's questions were addressed. Informed consent was obtained from the patient. The patient was placed supine on the interventional table. Ultrasound confirmed a patent right internal jugular vein. Ultrasound images were obtained for documentation. The right neck was prepped and draped in a sterile fashion. The right neck was anesthetized with 1% lidocaine. Maximal barrier sterile technique was utilized including caps, mask, sterile gowns, sterile gloves, sterile drape, hand hygiene and skin antiseptic. A small incision was made with #11 blade scalpel.  A 21 gauge needle directed into the right internal jugular vein with ultrasound guidance. Wire was advanced into the central venous system. Peel-away sheath was placed. A dual lumen Power PICC line was cut to 14 cm. The catheter was advanced through the peel-away sheath and positioned at the superior cavoatrial junction. Both lumens aspirated and flushed well. Both lumens were flushed with saline. Both lumens were capped and clamped. Catheter was sutured to skin. Dressing was placed. FINDINGS: Catheter tip at the superior cavoatrial junction. IMPRESSION: Successful placement of a non tunneled central venous catheter with ultrasound and fluoroscopic guidance. Electronically Signed   By: Markus Daft M.D.   On: 03/01/2019 13:11   IR US Guide Vasc Access Right  Result Date: 03/01/2019 INDICATION: 49 year old with end-stage renal disease on peritoneal dialysis. Patient has COVID-19 and poor venous access. Request for central line placement. EXAM: FLUOROSCOPIC AND ULTRASOUND GUIDED PLACEMENT OF A NON-TUNNELED CENTRAL VENOUS CATHETER Physician: Stephan Minister. Henn, MD MEDICATIONS: None ANESTHESIA/SEDATION: None FLUOROSCOPY TIME:  Fluoroscopy Time: 18 seconds, 1 mGy COMPLICATIONS: None immediate. PROCEDURE: The procedure was explained to the patient. The risks and benefits of the procedure were discussed and the patient's questions were addressed. Informed consent was obtained from the patient. The patient was placed supine on the interventional table. Ultrasound confirmed a patent right internal jugular vein. Ultrasound images were obtained for documentation. The right neck was prepped and draped in a sterile fashion. The right neck was anesthetized with 1% lidocaine. Maximal barrier sterile technique was utilized including caps, mask, sterile gowns, sterile gloves, sterile drape, hand hygiene and skin antiseptic. A small incision was made with #11 blade scalpel. A 21 gauge needle directed into the right internal jugular vein  with ultrasound guidance. Wire was advanced into the central venous system. Peel-away sheath was placed. A dual lumen Power PICC line was cut to 14 cm. The catheter was advanced through the peel-away sheath and positioned at the superior cavoatrial junction. Both lumens aspirated and flushed well. Both lumens were flushed with saline. Both lumens were capped and clamped. Catheter was sutured  to skin. Dressing was placed. FINDINGS: Catheter tip at the superior cavoatrial junction. IMPRESSION: Successful placement of a non tunneled central venous catheter with ultrasound and fluoroscopic guidance. Electronically Signed   By: Markus Daft M.D.   On: 03/01/2019 13:11   DG Chest Portable 1 View  Result Date: 02/24/2019 CLINICAL DATA:  Shortness of breath, abdominal pain, vomiting EXAM: PORTABLE CHEST 1 VIEW COMPARISON:  09/16/2018 FINDINGS: Lungs are clear.  No pleural effusion or pneumothorax. The heart is normal in size. IMPRESSION: No evidence of acute cardiopulmonary disease. Electronically Signed   By: Julian Hy M.D.   On: 02/24/2019 21:06     PERTINENT LAB RESULTS: CBC: Recent Labs    03/01/19 1700 03/02/19 0734  WBC 7.2 8.1  HGB 9.9* 9.1*  HCT 31.8* 30.0*  PLT 328 310   CMET CMP     Component Value Date/Time   NA 138 03/02/2019 0734   NA 132 (L) 07/26/2016 0000   K 4.0 03/02/2019 0734   CL 103 03/02/2019 0734   CO2 21 (L) 03/02/2019 0734   GLUCOSE 114 (H) 03/02/2019 0734   BUN 67 (H) 03/02/2019 0734   BUN 39 (H) 07/26/2016 0000   CREATININE 11.52 (H) 03/02/2019 0734   CALCIUM 7.5 (L) 03/02/2019 0734   CALCIUM 8.4 (L) 06/21/2017 1939   PROT 7.6 02/24/2019 1622   PROT 6.1 01/19/2019 1448   ALBUMIN 1.9 (L) 03/02/2019 0734   ALBUMIN 2.7 (L) 01/19/2019 1448   AST 16 02/24/2019 1622   ALT 16 02/24/2019 1622   ALKPHOS 93 02/24/2019 1622   BILITOT 1.1 02/24/2019 1622   BILITOT 0.3 01/19/2019 1448   GFRNONAA 3 (L) 03/02/2019 0734   GFRAA 4 (L) 03/02/2019 0734     GFR Estimated Creatinine Clearance: 5.4 mL/min (A) (by C-G formula based on SCr of 11.52 mg/dL (H)). No results for input(s): LIPASE, AMYLASE in the last 72 hours. No results for input(s): CKTOTAL, CKMB, CKMBINDEX, TROPONINI in the last 72 hours. Invalid input(s): POCBNP No results for input(s): DDIMER in the last 72 hours. No results for input(s): HGBA1C in the last 72 hours. No results for input(s): CHOL, HDL, LDLCALC, TRIG, CHOLHDL, LDLDIRECT in the last 72 hours. No results for input(s): TSH, T4TOTAL, T3FREE, THYROIDAB in the last 72 hours.  Invalid input(s): FREET3 No results for input(s): VITAMINB12, FOLATE, FERRITIN, TIBC, IRON, RETICCTPCT in the last 72 hours. Coags: No results for input(s): INR in the last 72 hours.  Invalid input(s): PT Microbiology: Recent Results (from the past 240 hour(s))  Respiratory Panel by RT PCR (Flu A&B, Covid) - Nasopharyngeal Swab     Status: Abnormal   Collection Time: 02/24/19  8:58 PM   Specimen: Nasopharyngeal Swab  Result Value Ref Range Status   SARS Coronavirus 2 by RT PCR POSITIVE (A) NEGATIVE Final    Comment: RESULT CALLED TO, READ BACK BY AND VERIFIED WITH: A. CAIN,RN 0018 02/25/2019 T. TYSOR (NOTE) SARS-CoV-2 target nucleic acids are DETECTED. SARS-CoV-2 RNA is generally detectable in upper respiratory specimens  during the acute phase of infection. Positive results are indicative of the presence of the identified virus, but do not rule out bacterial infection or co-infection with other pathogens not detected by the test. Clinical correlation with patient history and other diagnostic information is necessary to determine patient infection status. The expected result is Negative. Fact Sheet for Patients:  PinkCheek.be Fact Sheet for Healthcare Providers: GravelBags.it This test is not yet approved or cleared by the [place] and  has been authorized for  detection and/or diagnosis of SARS-CoV-2 by FDA under an Emergency Use Authorization (EUA).  This EUA will remain in effect (meaning this test can be used) f or the duration of  the COVID-19 declaration under Section 564(b)(1) of the Act, 21 U.S.C. section 360bbb-3(b)(1), unless the authorization is terminated or revoked sooner.    Influenza A by PCR NEGATIVE NEGATIVE Final   Influenza B by PCR NEGATIVE NEGATIVE Final    Comment: (NOTE) The Xpert Xpress SARS-CoV-2/FLU/RSV assay is intended as an aid in  the diagnosis of influenza from Nasopharyngeal swab specimens and  should not be used as a sole basis for treatment. Nasal washings and  aspirates are unacceptable for Xpert Xpress SARS-CoV-2/FLU/RSV  testing. Fact Sheet for Patients: PinkCheek.be Fact Sheet for Healthcare Providers: GravelBags.it This test is not yet approved or cleared by the [place] FDA and  has been authorized for detection and/or diagnosis of SARS-CoV-2 by  FDA under an Emergency Use Authorization (EUA). This EUA will remain  in effect (meaning this test can be used) for the duration of the  Covid-19 declaration under Section 564(b)(1) of the Act, 21  U.S.C. section 360bbb-3(b)(1), unless the authorization is  terminated or revoked. Performed at [facility], [place] [address] [place], [place] [address]   Culture, body fluid-bottle     Status: None (Preliminary result)   Collection Time: 03/01/19  7:35 AM   Specimen: Fluid  Result Value Ref Range Status   Specimen Description FLUID PERITONEAL  Final   Special Requests BOTTLES DRAWN AEROBIC AND ANAEROBIC  Final   Culture   Final    NO GROWTH < 12 HOURS Performed at [facility], [place] [address], [place], [place] [address]    Report Status PENDING  Incomplete    FURTHER DISCHARGE INSTRUCTIONS:  Get Medicines reviewed and adjusted: Please take all your medications with you for your  next visit with your Primary MD  Laboratory/radiological data: Please request your Primary MD to go over all hospital tests and procedure/radiological results at the follow up, please ask your Primary MD to get all Hospital records sent to his/her office.  In some cases, they will be blood work, cultures and biopsy results pending at the time of your discharge. Please request that your primary care M.D. goes through all the records of your hospital data and follows up on these results.  Also Note the following: If you experience worsening of your admission symptoms, develop shortness of breath, life threatening emergency, suicidal or homicidal thoughts you must seek medical attention immediately by calling 911 or calling your MD immediately  if symptoms less severe.  You must read complete instructions/literature along with all the possible adverse reactions/side effects for all the Medicines you take and that have been prescribed to you. Take any new Medicines after you have completely understood and accpet all the possible adverse reactions/side effects.   Do not drive when taking Pain medications or sleeping medications (Benzodaizepines)  Do not take more than prescribed Pain, Sleep and Anxiety Medications. It is not advisable to combine anxiety,sleep and pain medications without talking with your primary care practitioner  Special Instructions: If you have smoked or chewed Tobacco  in the last 2 yrs please stop smoking, stop any regular Alcohol  and or any Recreational drug use.  Wear Seat belts while driving.  Please note: You were cared for by a hospitalist during your hospital stay. Once you are discharged, your primary care  physician will handle any further medical issues. Please note that NO REFILLS for any discharge medications will be authorized once you are discharged, as it is imperative that you return to your primary care physician (or establish a relationship with a primary care  physician if you do not have one) for your post hospital discharge needs so that they can reassess your need for medications and monitor your lab values.  Total Time spent coordinating discharge including counseling, education and face to face time equals 35 minutes.  SignedOren Binet 03/02/2019 9:53 AM

## 2019-03-02 NOTE — Progress Notes (Signed)
Refused PD last night Really wants to go home-has been wanting to go home for the past few days Claims that her vomiting is now much better-she was able to eat yesterday. She finishes her last dose of remdesivir today-her peritoneal fluid cultures are negative so far. Since patient has chronic nausea/vomiting/abdominal pain from severe gastroparesis-and appears to be close to baseline-has been refusing PD-and has been requesting to go home-I suspect she is as stable as she can get to be discharged home today.    See discharge summary for details.

## 2019-03-02 NOTE — Care Management (Signed)
CM deemed appropriate for discharge home today.  Discharge order signed.  CM reviewed chart for TOC needs/orders/consults -none found.  CM signing off

## 2019-03-02 NOTE — Progress Notes (Signed)
Patient refused PD treatment, all medications and all wound care orders X3. Education given with no success. MD aware.

## 2019-03-02 NOTE — Progress Notes (Signed)
Right IJ central line removed per protocol for pt's discharge. Manual pressure applied for 5 mins. No bleeding or swelling noted. Instructed patient to remain in bed until 1340. Educated patient about S/S of infection and when to call MD; no heavy lifting or pressure on right side for 24 hours; keep dressing dry and intact until 1330 tomorrow. Pt verbalized comprehension.

## 2019-03-02 NOTE — Discharge Instructions (Signed)
Person Under Monitoring Name: Susan Horton  Location: [address] [place] [address]   Infection Prevention Recommendations for Individuals Confirmed to have, or Being Evaluated for, 2019 Novel Coronavirus (COVID-19) Infection Who Receive Care at Home  Individuals who are confirmed to have, or are being evaluated for, COVID-19 should follow the prevention steps below until a healthcare provider or local or state health department says they can return to normal activities.  Stay home except to get medical care You should restrict activities outside your home, except for getting medical care. Do not go to work, school, or public areas, and do not use public transportation or taxis.  Call ahead before visiting your doctor Before your medical appointment, call the healthcare provider and tell them that you have, or are being evaluated for, COVID-19 infection. This will help the healthcare provider's office take steps to keep other people from getting infected. Ask your healthcare provider to call the local or state health department.  Monitor your symptoms Seek prompt medical attention if your illness is worsening (e.g., difficulty breathing). Before going to your medical appointment, call the healthcare provider and tell them that you have, or are being evaluated for, COVID-19 infection. Ask your healthcare provider to call the local or state health department.  Wear a facemask You should wear a facemask that covers your nose and mouth when you are in the same room with other people and when you visit a healthcare provider. People who live with or visit you should also wear a facemask while they are in the same room with you.  Separate yourself from other people in your home As much as possible, you should stay in a different room from other people in your home. Also, you should use a separate bathroom, if available.  Avoid sharing household items You should not  share dishes, drinking glasses, cups, eating utensils, towels, bedding, or other items with other people in your home. After using these items, you should wash them thoroughly with soap and water.  Cover your coughs and sneezes Cover your mouth and nose with a tissue when you cough or sneeze, or you can cough or sneeze into your sleeve. Throw used tissues in a lined trash can, and immediately wash your hands with soap and water for at least 20 seconds or use an alcohol-based hand rub.  Wash your [facility] your hands often and thoroughly with soap and water for at least 20 seconds. You can use an alcohol-based hand sanitizer if soap and water are not available and if your hands are not visibly dirty. Avoid touching your eyes, nose, and mouth with unwashed hands.   Prevention Steps for Caregivers and Household Members of Individuals Confirmed to have, or Being Evaluated for, COVID-19 Infection Being Cared for in the Home  If you live with, or provide care at home for, a person confirmed to have, or being evaluated for, COVID-19 infection please follow these guidelines to prevent infection:  Follow healthcare provider's instructions Make sure that you understand and can help the patient follow any healthcare provider instructions for all care.  Provide for the patient's basic needs You should help the patient with basic needs in the home and provide support for getting groceries, prescriptions, and other personal needs.  Monitor the patient's symptoms If they are getting sicker, call his or her medical provider and tell them that the patient has, or is being evaluated for, COVID-19 infection. This will help the healthcare  provider's office take steps to keep other people from getting infected. Ask the healthcare provider to call the local or state health department.  Limit the number of people who have contact with the patient  If possible, have only one caregiver for the  patient.  Other household members should stay in another home or place of residence. If this is not possible, they should stay  in another room, or be separated from the patient as much as possible. Use a separate bathroom, if available.  Restrict visitors who do not have an essential need to be in the home.  Keep older adults, very young children, and other sick people away from the patient Keep older adults, very young children, and those who have compromised immune systems or chronic health conditions away from the patient. This includes people with chronic heart, lung, or kidney conditions, diabetes, and cancer.  Ensure good ventilation Make sure that shared spaces in the home have good air flow, such as from an air conditioner or an opened window, weather permitting.  Wash your hands often  Wash your hands often and thoroughly with soap and water for at least 20 seconds. You can use an alcohol based hand sanitizer if soap and water are not available and if your hands are not visibly dirty.  Avoid touching your eyes, nose, and mouth with unwashed hands.  Use disposable paper towels to dry your hands. If not available, use dedicated cloth towels and replace them when they become wet.  Wear a facemask and gloves  Wear a disposable facemask at all times in the room and gloves when you touch or have contact with the patient's blood, body fluids, and/or secretions or excretions, such as sweat, saliva, sputum, nasal mucus, vomit, urine, or feces.  Ensure the mask fits over your nose and mouth tightly, and do not touch it during use.  Throw out disposable facemasks and gloves after using them. Do not reuse.  Wash your hands immediately after removing your facemask and gloves.  If your personal clothing becomes contaminated, carefully remove clothing and launder. Wash your hands after handling contaminated clothing.  Place all used disposable facemasks, gloves, and other waste in a lined  container before disposing them with other household waste.  Remove gloves and wash your hands immediately after handling these items.  Do not share dishes, glasses, or other household items with the patient  Avoid sharing household items. You should not share dishes, drinking glasses, cups, eating utensils, towels, bedding, or other items with a patient who is confirmed to have, or being evaluated for, COVID-19 infection.  After the person uses these items, you should wash them thoroughly with soap and water.  Wash laundry thoroughly  Immediately remove and wash clothes or bedding that have blood, body fluids, and/or secretions or excretions, such as sweat, saliva, sputum, nasal mucus, vomit, urine, or feces, on them.  Wear gloves when handling laundry from the patient.  Read and follow directions on labels of laundry or clothing items and detergent. In general, wash and dry with the warmest temperatures recommended on the label.  Clean all areas the individual has used often  Clean all touchable surfaces, such as counters, tabletops, doorknobs, bathroom fixtures, toilets, phones, keyboards, tablets, and bedside tables, every day. Also, clean any surfaces that may have blood, body fluids, and/or secretions or excretions on them.  Wear gloves when cleaning surfaces the patient has come in contact with.  Use a diluted bleach solution (e.g., dilute bleach with  1 part bleach and 10 parts water) or a household disinfectant with a label that says EPA-registered for coronaviruses. To make a bleach solution at home, add 1 tablespoon of bleach to 1 quart (4 cups) of water. For a larger supply, add  cup of bleach to 1 gallon (16 cups) of water.  Read labels of cleaning products and follow recommendations provided on product labels. Labels contain instructions for safe and effective use of the cleaning product including precautions you should take when applying the product, such as wearing gloves or  eye protection and making sure you have good ventilation during use of the product.  Remove gloves and wash hands immediately after cleaning.  Monitor yourself for signs and symptoms of illness Caregivers and household members are considered close contacts, should monitor their health, and will be asked to limit movement outside of the home to the extent possible. Follow the monitoring steps for close contacts listed on the symptom monitoring form.   ? If you have additional questions, contact your local health department or call the epidemiologist on call at [phone] (available 24/7). ? This guidance is subject to change. For the most up-to-date guidance from [facility], please refer to their website: [email or web address]

## 2019-03-02 NOTE — Plan of Care (Signed)
  Problem: Clinical Measurements: Goal: Respiratory complications will improve Outcome: Progressing   

## 2019-03-02 NOTE — Plan of Care (Signed)
Patient is being discharged after she is finished with her IV medication.  Discharged to home.

## 2019-03-05 ENCOUNTER — Telehealth: Payer: Self-pay

## 2019-03-05 NOTE — Telephone Encounter (Signed)
Transition Care Management Follow-up Telephone Call  Date of discharge and from where: 03/02/2019, [facility]   How have you been since you were released from the hospital? She said that she has been feeling "pretty good."   Any questions or concerns? None reported at this time   Items Reviewed:  Did the pt receive and understand the discharge instructions provided? She said that she has the instructions and did not have any questions   Medications obtained and verified? She said that she has the instructions and all medications and does not have any questions or need to review the medication list. She confirmed that she is aware of the new changed and discontinued medications.    Any new allergies since your discharge? None reported   Do you have support at home?   She said that she has help at home  Other (ie: DME, Home Health, etc) does PD.   Has glucometer, blood sugar this morning 105.  She understands that she is to remain in quarantine as instructed.   Functional Questionnaire: (I = Independent and D = Dependent) ADL's: independent   Follow up appointments reviewed:    PCP Hospital f/u appt confirmed? She did not want to schedule an appointment at this time   [facility] f/u appt confirmed? She said she already has her appointment with GI at [facility].  She also said that she has a cardiology appointment scheduled   Are transportation arrangements needed? No   If their condition worsens, is the pt aware to call  their PCP or go to the ED? yes  Was the patient provided with contact information for the PCP's office or ED? she has the phone number for the clinic.  Was the pt encouraged to call back with questions or concerns?  yes

## 2019-03-05 NOTE — Telephone Encounter (Signed)
Hospital discharge call completed. She did not want to schedule a follow up with PCP at at this time. She had no questions/concerns

## 2019-03-06 DIAGNOSIS — U071 COVID-19: Secondary | ICD-10-CM | POA: Insufficient documentation

## 2019-03-06 LAB — CULTURE, BODY FLUID W GRAM STAIN -BOTTLE: Culture: NO GROWTH

## 2019-03-10 DIAGNOSIS — Z992 Dependence on renal dialysis: Secondary | ICD-10-CM | POA: Diagnosis not present

## 2019-03-10 DIAGNOSIS — N186 End stage renal disease: Secondary | ICD-10-CM | POA: Diagnosis not present

## 2019-03-10 DIAGNOSIS — N2581 Secondary hyperparathyroidism of renal origin: Secondary | ICD-10-CM | POA: Diagnosis not present

## 2019-03-10 DIAGNOSIS — D509 Iron deficiency anemia, unspecified: Secondary | ICD-10-CM | POA: Diagnosis not present

## 2019-03-12 DIAGNOSIS — N186 End stage renal disease: Secondary | ICD-10-CM | POA: Diagnosis not present

## 2019-03-12 DIAGNOSIS — Z992 Dependence on renal dialysis: Secondary | ICD-10-CM | POA: Diagnosis not present

## 2019-03-12 DIAGNOSIS — E1122 Type 2 diabetes mellitus with diabetic chronic kidney disease: Secondary | ICD-10-CM | POA: Diagnosis not present

## 2019-03-13 DIAGNOSIS — U071 COVID-19: Secondary | ICD-10-CM | POA: Diagnosis not present

## 2019-03-13 DIAGNOSIS — N186 End stage renal disease: Secondary | ICD-10-CM | POA: Diagnosis not present

## 2019-03-13 DIAGNOSIS — Z992 Dependence on renal dialysis: Secondary | ICD-10-CM | POA: Diagnosis not present

## 2019-03-13 DIAGNOSIS — N2581 Secondary hyperparathyroidism of renal origin: Secondary | ICD-10-CM | POA: Diagnosis not present

## 2019-03-13 DIAGNOSIS — D631 Anemia in chronic kidney disease: Secondary | ICD-10-CM | POA: Diagnosis not present

## 2019-03-15 DIAGNOSIS — N2581 Secondary hyperparathyroidism of renal origin: Secondary | ICD-10-CM | POA: Diagnosis not present

## 2019-03-15 DIAGNOSIS — U071 COVID-19: Secondary | ICD-10-CM | POA: Diagnosis not present

## 2019-03-15 DIAGNOSIS — N186 End stage renal disease: Secondary | ICD-10-CM | POA: Diagnosis not present

## 2019-03-15 DIAGNOSIS — Z992 Dependence on renal dialysis: Secondary | ICD-10-CM | POA: Diagnosis not present

## 2019-03-15 DIAGNOSIS — D631 Anemia in chronic kidney disease: Secondary | ICD-10-CM | POA: Diagnosis not present

## 2019-03-16 DIAGNOSIS — L0231 Cutaneous abscess of buttock: Secondary | ICD-10-CM | POA: Diagnosis not present

## 2019-03-17 DIAGNOSIS — N2581 Secondary hyperparathyroidism of renal origin: Secondary | ICD-10-CM | POA: Diagnosis not present

## 2019-03-17 DIAGNOSIS — N186 End stage renal disease: Secondary | ICD-10-CM | POA: Diagnosis not present

## 2019-03-17 DIAGNOSIS — D631 Anemia in chronic kidney disease: Secondary | ICD-10-CM | POA: Diagnosis not present

## 2019-03-17 DIAGNOSIS — Z992 Dependence on renal dialysis: Secondary | ICD-10-CM | POA: Diagnosis not present

## 2019-03-19 ENCOUNTER — Ambulatory Visit: Payer: Medicare Other | Admitting: Neurology

## 2019-03-19 DIAGNOSIS — J45901 Unspecified asthma with (acute) exacerbation: Secondary | ICD-10-CM | POA: Insufficient documentation

## 2019-03-19 DIAGNOSIS — H269 Unspecified cataract: Secondary | ICD-10-CM | POA: Insufficient documentation

## 2019-03-20 DIAGNOSIS — Z992 Dependence on renal dialysis: Secondary | ICD-10-CM | POA: Diagnosis not present

## 2019-03-20 DIAGNOSIS — N2581 Secondary hyperparathyroidism of renal origin: Secondary | ICD-10-CM | POA: Diagnosis not present

## 2019-03-20 DIAGNOSIS — D631 Anemia in chronic kidney disease: Secondary | ICD-10-CM | POA: Diagnosis not present

## 2019-03-20 DIAGNOSIS — N186 End stage renal disease: Secondary | ICD-10-CM | POA: Diagnosis not present

## 2019-03-20 DIAGNOSIS — U071 COVID-19: Secondary | ICD-10-CM | POA: Diagnosis not present

## 2019-03-22 DIAGNOSIS — N2581 Secondary hyperparathyroidism of renal origin: Secondary | ICD-10-CM | POA: Diagnosis not present

## 2019-03-22 DIAGNOSIS — Z992 Dependence on renal dialysis: Secondary | ICD-10-CM | POA: Diagnosis not present

## 2019-03-22 DIAGNOSIS — N186 End stage renal disease: Secondary | ICD-10-CM | POA: Diagnosis not present

## 2019-03-22 DIAGNOSIS — D649 Anemia, unspecified: Secondary | ICD-10-CM | POA: Diagnosis not present

## 2019-03-27 DIAGNOSIS — N2581 Secondary hyperparathyroidism of renal origin: Secondary | ICD-10-CM | POA: Diagnosis not present

## 2019-03-27 DIAGNOSIS — D649 Anemia, unspecified: Secondary | ICD-10-CM | POA: Diagnosis not present

## 2019-03-27 DIAGNOSIS — Z992 Dependence on renal dialysis: Secondary | ICD-10-CM | POA: Diagnosis not present

## 2019-03-27 DIAGNOSIS — N186 End stage renal disease: Secondary | ICD-10-CM | POA: Diagnosis not present

## 2019-03-29 DIAGNOSIS — N2581 Secondary hyperparathyroidism of renal origin: Secondary | ICD-10-CM | POA: Diagnosis not present

## 2019-03-29 DIAGNOSIS — Z992 Dependence on renal dialysis: Secondary | ICD-10-CM | POA: Diagnosis not present

## 2019-03-29 DIAGNOSIS — D649 Anemia, unspecified: Secondary | ICD-10-CM | POA: Diagnosis not present

## 2019-03-29 DIAGNOSIS — N186 End stage renal disease: Secondary | ICD-10-CM | POA: Diagnosis not present

## 2019-03-31 DIAGNOSIS — N2581 Secondary hyperparathyroidism of renal origin: Secondary | ICD-10-CM | POA: Diagnosis not present

## 2019-03-31 DIAGNOSIS — D649 Anemia, unspecified: Secondary | ICD-10-CM | POA: Diagnosis not present

## 2019-03-31 DIAGNOSIS — Z992 Dependence on renal dialysis: Secondary | ICD-10-CM | POA: Diagnosis not present

## 2019-03-31 DIAGNOSIS — N186 End stage renal disease: Secondary | ICD-10-CM | POA: Diagnosis not present

## 2019-04-02 ENCOUNTER — Telehealth: Payer: Self-pay | Admitting: Cardiovascular Disease

## 2019-04-02 NOTE — Telephone Encounter (Signed)
Returned call to patient she stated she has been sob and pain in both legs since she was discharged from [facility] hospital 03/02/19.Stated she is sob with and without exertion.No chest pain.Stated when she walks she has pain in both lower legs.Stated she has neuropathy.Advised to keep appointment already scheduled with Dr.Berry 3/23 at 2:15 pm.Advised to go to ED if she gets worse.

## 2019-04-02 NOTE — Telephone Encounter (Signed)
Pt c/o Shortness Of Breath: STAT if SOB developed within the last 24 hours or pt is noticeably SOB on the phone  1. Are you currently SOB (can you hear that pt is SOB on the phone)? No   2. How long have you been experiencing SOB? The past month   3. Are you SOB when sitting or when up moving around? Both   4. Are you currently experiencing any other symptoms? Pain in legs    Susan Horton is calling stating she has been experiencing SOB and pain in her legs. She states the SOB is more when moving around, but still occurs when sitting. The pain in her legs feels as if they did before when she had blood clots without being hot to touch like they were last time. An appointment has been scheduled in regards to this for tomorrow 04/03/19 at 2:15 PM. Please advise.

## 2019-04-03 ENCOUNTER — Encounter: Payer: Self-pay | Admitting: Cardiovascular Disease

## 2019-04-03 ENCOUNTER — Ambulatory Visit (INDEPENDENT_AMBULATORY_CARE_PROVIDER_SITE_OTHER): Payer: Medicare Other | Admitting: Cardiovascular Disease

## 2019-04-03 ENCOUNTER — Other Ambulatory Visit: Payer: Self-pay

## 2019-04-03 VITALS — BP 140/70 | HR 90 | Ht 65.0 in | Wt 151.0 lb

## 2019-04-03 DIAGNOSIS — I2510 Atherosclerotic heart disease of native coronary artery without angina pectoris: Secondary | ICD-10-CM

## 2019-04-03 DIAGNOSIS — Z9861 Coronary angioplasty status: Secondary | ICD-10-CM | POA: Diagnosis not present

## 2019-04-03 DIAGNOSIS — R0602 Shortness of breath: Secondary | ICD-10-CM | POA: Diagnosis not present

## 2019-04-03 DIAGNOSIS — E785 Hyperlipidemia, unspecified: Secondary | ICD-10-CM | POA: Diagnosis not present

## 2019-04-03 DIAGNOSIS — I5032 Chronic diastolic (congestive) heart failure: Secondary | ICD-10-CM

## 2019-04-03 DIAGNOSIS — I10 Essential (primary) hypertension: Secondary | ICD-10-CM

## 2019-04-03 NOTE — Progress Notes (Signed)
04/03/2019 Susan Horton   July 07, 1970  [id number]  Primary Physician Charlott Rakes, MD Primary Cardiologist: [name] MD [name], [place]  HPI:  Susan Horton is a 49 y.o.  moderately overweight single African-American female mother of 56, grandmother and 4 grandchildren whoI last saw in the office  01/19/2019.She has a history of treated hypertension, hyperlipidemia and diabetes which she's had for the last 22 years. She has CKD 3 with creatinine running in the mid 3 range followed by Dr. Jimmy Footman. She's noticed chest pain over the last 3-4 months occurring several times a week lasting minutes at a time worse with exertion. She had a Myoview stress test was read as intermediate risk with inferior ischemia. Based on this, I decided to proceed with outpatient radial diagnostic coronary angiography using limited contrast. This was performed 08/05/16 the right femoral approach revealed an 80% mid LAD which was stented using a synergy drug-eluting stent reducing 80% stenosis to 0% residual. She did have moderate mid RCA stenosis which was not addressed. She had left main spasm probably related to the guide catheter doing a procedure that responded to intra-coronary nitroglycerin. Her chest pain has resolved and she has stopped smoking since. She did have carotid Dopplers performed because of bruits that showed mild to moderate left ICA stenosis which will be followed on an annual basis. Since I saw her 4 months ago she has done well. She gets occasional chest pain during hemodialysis. She recently had a peritoneal dialysis placed yesterday and is planning on transitioning to home peritoneal dialysis. We have also talked about going on the kidney transplant list although her hemoglobin A1c is still elevated in the 11 range. She has stopped smoking. She was recently begun on Repatha for resistant hyperlipidemiabut unfortunately there was a lapse in her insurance last several months.  She will restart this.  She is tolerating peritoneal dialysis without difficulty.  She has had right SFA intervention by Dr. Carlis Abbott recently.  She had mentioned at her last visit that she was complaining of some mild increase in dyspnea on exertion.  As a result of this, I ordered a 2D echo 12/22/2018 which was normal and a Myoview stress test 12/21/2018 which was normal as well.  She was admitted in February for sacral abscess and since that time has had worsening fatigue weakness and shortness of breath.   Current Meds  Medication Sig  . albuterol (PROVENTIL HFA;VENTOLIN HFA) 108 (90 BASE) MCG/ACT inhaler Inhale 1-2 puffs into the lungs every 6 (six) hours as needed for wheezing or shortness of breath.   . Alirocumab (PRALUENT) 75 MG/ML SOAJ Inject 75 mg into the skin every 14 (fourteen) days.  [name] amitriptyline (ELAVIL) 10 MG tablet Take 1 tablet (10 mg total) by mouth at bedtime.  [name] aspirin 81 MG chewable tablet Chew 1 tablet (81 mg total) by mouth daily.  [name] atorvastatin (LIPITOR) 40 MG tablet Take 1 tablet (40 mg total) by mouth daily.  . B Complex-C-Zn-Folic Acid (DIALYVITE 160 WITH ZINC) 0.8 MG TABS Take 1 tablet by mouth daily.  . calcitRIOL (ROCALTROL) 0.5 MCG capsule Take 1.5 mcg by mouth daily.  . calcium acetate (PHOSLO) 667 MG capsule Take 1,334-2,001 mg by mouth See admin instructions. Take three capsules (2001 mg) by mouth up to three times daily with meals and two capsules (1334 mg) with snacks  . clopidogrel (PLAVIX) 75 MG tablet Take 1 tablet (75 mg total) by mouth daily with breakfast.  .  Ferrous Sulfate (IRON) 325 (65 Fe) MG TABS Take 325 mg by mouth daily.  . Fluticasone-Salmeterol (ADVAIR) 250-50 MCG/DOSE AEPB Inhale 1 puff into the lungs 2 (two) times daily. (Patient taking differently: Inhale 1 puff into the lungs daily as needed (shortness of breath). )  . gentamicin cream (GARAMYCIN) 0.1 % Apply 1 application topically See admin instructions. Apply to access site  nightly  . insulin aspart (NOVOLOG) 100 UNIT/ML injection 0-6 Units, Subcutaneous, 3 times daily with meals CBG < 70: Implement Hypoglycemia measures/call MD CBG 70 - 120: 0 units CBG 121 - 150: 0 units CBG 151 - 200: 1 unit CBG 201-250: 2 units CBG 251-300: 3 units CBG 301-350: 4 units CBG 351-400: 5 units CBG > 400: Give 6 units and call MD  . Insulin Glargine, 1 Unit Dial, (TOUJEO SOLOSTAR) 300 UNIT/ML SOPN Inject 5 Units into the skin 2 (two) times daily.  [name] lidocaine-prilocaine (EMLA) cream Apply 1 application topically See admin instructions. Apply small amount to access site (AVF) 30 minutes before hemodialysis, cover with occlusive dressing (Saran Wrap) if going out to dialysis center  . methocarbamol (ROBAXIN) 500 MG tablet Take 1 tablet (500 mg total) by mouth 2 (two) times daily as needed for muscle spasms.  . metoCLOPramide (REGLAN) 5 MG tablet TAKE 1 TABLET BY MOUTH 4 TIMES DAILY BEFORE MEALS AND AT BEDTIME. (Patient taking differently: Take 5 mg by mouth 4 (four) times daily -  before meals and at bedtime. )  . metoprolol succinate (TOPROL-XL) 50 MG 24 hr tablet Take 1 tablet (50 mg total) by mouth daily.  . montelukast (SINGULAIR) 10 MG tablet Take 1 tablet (10 mg total) by mouth at bedtime. (Patient taking differently: Take 10 mg by mouth at bedtime as needed (allergies). )  . nitroGLYCERIN (NITROSTAT) 0.4 MG SL tablet Place 1 tablet (0.4 mg total) under the tongue every 5 (five) minutes as needed for chest pain.  [name] ondansetron (ZOFRAN ODT) 4 MG disintegrating tablet Take 1 tablet (4 mg total) by mouth every 8 (eight) hours as needed for nausea or vomiting.  . pantoprazole (PROTONIX) 40 MG tablet TAKE 1 TABLET BY MOUTH DAILY. (Patient taking differently: Take 40 mg by mouth daily. )  . pregabalin (LYRICA) 75 MG capsule Take 1 capsule (75 mg total) by mouth daily.  [name] trimethoprim-polymyxin b (POLYTRIM) ophthalmic solution Place 1 drop into both eyes See admin instructions. Instill one drop  into both eyes four times daily for 2 days after each monthly eye injection     Allergies  Allergen Reactions  . Repatha [Evolocumab] Itching  . Lisinopril Cough    Social History   Socioeconomic History  . Marital status: Single    Spouse name: Not on file  . Number of children: 3  . Years of education: 84  . Highest education level: Not on file  Occupational History  . Occupation: Curator  Tobacco Use  . Smoking status: Former Smoker    Years: 20.00    Types: Cigarettes    Quit date: 11/2016    Years since quitting: 2.3  . Smokeless tobacco: Never Used  Substance and Sexual Activity  . Alcohol use: No    Alcohol/week: 0.0 standard drinks  . Drug use: No  . Sexual activity: Not on file  Other Topics Concern  . Not on file  Social History Narrative   Lives with daughter in a 2 story home.  Has 3 children.  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  Education: college.    Social Determinants of Health   Financial Resource Strain:   . Difficulty of Paying Living Expenses:   Food Insecurity:   . Worried About Charity fundraiser in the Last Year:   . Arboriculturist in the Last Year:   Transportation Needs:   . Film/video editor (Medical):   [name] Lack of Transportation (Non-Medical):   Physical Activity:   . Days of Exercise per Week:   . Minutes of Exercise per Session:   Stress:   . Feeling of Stress :   Social Connections:   . Frequency of Communication with Friends and Family:   . Frequency of Social Gatherings with Friends and Family:   . Attends Religious Services:   . Active Member of Clubs or Organizations:   . Attends Archivist Meetings:   [name] Marital Status:   Intimate Partner Violence:   . Fear of Current or Ex-Partner:   . Emotionally Abused:   [name] Physically Abused:   . Sexually Abused:      Review of Systems: General: negative for chills, fever, night sweats or weight changes.  Cardiovascular:  negative for chest pain, dyspnea on exertion, edema, orthopnea, palpitations, paroxysmal nocturnal dyspnea or shortness of breath Dermatological: negative for rash Respiratory: negative for cough or wheezing Urologic: negative for hematuria Abdominal: negative for nausea, vomiting, diarrhea, bright red blood per rectum, melena, or hematemesis Neurologic: negative for visual changes, syncope, or dizziness All other systems reviewed and are otherwise negative except as noted above.    Blood pressure 140/70, pulse 90, height 5\' 5"  (1.651 m), weight 151 lb (68.5 kg), SpO2 95 %.  General appearance: alert and no distress Neck: no adenopathy, no JVD, supple, symmetrical, trachea midline, thyroid not enlarged, symmetric, no tenderness/mass/nodules and Soft bilateral carotid bruits Lungs: clear to auscultation bilaterally Heart: regular rate and rhythm, S1, S2 normal, no murmur, click, rub or gallop Extremities: extremities normal, atraumatic, no cyanosis or edema Pulses: 2+ and symmetric Skin: Skin color, texture, turgor normal. No rashes or lesions Neurologic: Alert and oriented X 3, normal strength and tone. Normal symmetric reflexes. Normal coordination and gait  EKG sinus rhythm at 90 without ST or T wave changes.  Personally reviewed this EKG.  ASSESSMENT AND PLAN:   CAD S/P mLAD PCI with DES History of CAD status post LAD intervention by myself 08/05/2016 via the femoral approach.  She did have moderate proximal and mid RCA stenosis which was not addressed and normal LV function.  Because of dyspnea I obtain a 2D echo and Myoview stress test in December which showed normal LV systolic function without evidence of ischemia.  Chronic diastolic CHF (congestive heart failure) (HCC) Chronic diastolic heart failure on peritoneal dialysis  Essential hypertension, benign History of essential hypertension blood pressure measured today 140/70.  She is on metoprolol.  Dyslipidemia History of  dyslipidemia on atorvastatin and Praluent with lipid profile performed 01/19/2019 revealing a total cholesterol of 51, LDL of 90 and HDL of 36.      [name] MD FACP,FACC,FAHA, [facility] 04/03/2019 2:44 PM

## 2019-04-03 NOTE — Assessment & Plan Note (Signed)
Chronic diastolic heart failure on peritoneal dialysis

## 2019-04-03 NOTE — Assessment & Plan Note (Signed)
History of CAD status post LAD intervention by myself 08/05/2016 via the femoral approach.  She did have moderate proximal and mid RCA stenosis which was not addressed and normal LV function.  Because of dyspnea I obtain a 2D echo and Myoview stress test in December which showed normal LV systolic function without evidence of ischemia.

## 2019-04-03 NOTE — Patient Instructions (Signed)
Medication Instructions:  NO CHANGE *If you need a refill on your cardiac medications before your next appointment, please call your pharmacy*   Lab Work: If you have labs (blood work) drawn today and your tests are completely normal, you will receive your results only by: [name] MyChart Message (if you have MyChart) OR . A paper copy in the mail If you have any lab test that is abnormal or we need to change your treatment, we will call you to review the results.   Testing/Procedures: Your physician has requested that you have an echocardiogram. Echocardiography is a painless test that uses sound waves to create images of your heart. It provides your doctor with information about the size and shape of your heart and how well your heart's chambers and valves are working. This procedure takes approximately one hour. There are no restrictions for this procedure.[place]     Follow-Up: At [facility], you and your health needs are our priority.  As part of our continuing mission to provide you with exceptional heart care, we have created designated Provider Care Teams.  These Care Teams include your primary Cardiologist (physician) and Advanced Practice Providers (APPs -  Physician Assistants and Nurse Practitioners) who all work together to provide you with the care you need, when you need it.  We recommend signing up for the patient portal called "MyChart".  Sign up information is provided on this After Visit Summary.  MyChart is used to connect with patients for Virtual Visits (Telemedicine).  Patients are able to view lab/test results, encounter notes, upcoming appointments, etc.  Non-urgent messages can be sent to your provider as well.   To learn more about what you can do with MyChart, go to [email or web address].    Your next appointment:   Your physician recommends that you schedule a follow-up appointment in: [place] physician wants you to follow-up in:  [place] will receive a reminder letter in the mail two months in advance. If you don't receive a letter, please call our office to schedule the follow-up appointment.

## 2019-04-03 NOTE — Assessment & Plan Note (Signed)
History of dyslipidemia on atorvastatin and Praluent with lipid profile performed 01/19/2019 revealing a total cholesterol of 51, LDL of 90 and HDL of 36.

## 2019-04-03 NOTE — Assessment & Plan Note (Signed)
History of essential hypertension blood pressure measured today 140/70.  She is on metoprolol.

## 2019-04-04 ENCOUNTER — Telehealth: Payer: Self-pay | Admitting: Family Medicine

## 2019-04-04 DIAGNOSIS — R58 Hemorrhage, not elsewhere classified: Secondary | ICD-10-CM

## 2019-04-04 DIAGNOSIS — D649 Anemia, unspecified: Secondary | ICD-10-CM | POA: Diagnosis not present

## 2019-04-04 DIAGNOSIS — Z992 Dependence on renal dialysis: Secondary | ICD-10-CM | POA: Diagnosis not present

## 2019-04-04 DIAGNOSIS — N186 End stage renal disease: Secondary | ICD-10-CM | POA: Diagnosis not present

## 2019-04-04 DIAGNOSIS — N2581 Secondary hyperparathyroidism of renal origin: Secondary | ICD-10-CM | POA: Diagnosis not present

## 2019-04-04 NOTE — Telephone Encounter (Signed)
Patient is needing a referral placed to [facility] for hemorrhoids.

## 2019-04-04 NOTE — Telephone Encounter (Signed)
Patient called and requested for a note to be sent to pcp regarding her hemorrhoids. Patient stated that she currently goes to [facility] for a wound and she informed them of the hemorrhoids that are currently very uncomfortable and protruded out and that they([facility]) informed the patient that they would be able to help her but pcp needs to place the referral. Please follow up at your earliest convenience.

## 2019-04-04 NOTE — Telephone Encounter (Signed)
Referral has been placed. 

## 2019-04-05 DIAGNOSIS — N2581 Secondary hyperparathyroidism of renal origin: Secondary | ICD-10-CM | POA: Diagnosis not present

## 2019-04-05 DIAGNOSIS — N186 End stage renal disease: Secondary | ICD-10-CM | POA: Diagnosis not present

## 2019-04-05 DIAGNOSIS — Z992 Dependence on renal dialysis: Secondary | ICD-10-CM | POA: Diagnosis not present

## 2019-04-05 DIAGNOSIS — D649 Anemia, unspecified: Secondary | ICD-10-CM | POA: Diagnosis not present

## 2019-04-06 DIAGNOSIS — N186 End stage renal disease: Secondary | ICD-10-CM | POA: Diagnosis not present

## 2019-04-06 DIAGNOSIS — Z992 Dependence on renal dialysis: Secondary | ICD-10-CM | POA: Diagnosis not present

## 2019-04-06 DIAGNOSIS — N2581 Secondary hyperparathyroidism of renal origin: Secondary | ICD-10-CM | POA: Diagnosis not present

## 2019-04-06 DIAGNOSIS — D649 Anemia, unspecified: Secondary | ICD-10-CM | POA: Diagnosis not present

## 2019-04-09 DIAGNOSIS — Z992 Dependence on renal dialysis: Secondary | ICD-10-CM | POA: Diagnosis not present

## 2019-04-09 DIAGNOSIS — N2581 Secondary hyperparathyroidism of renal origin: Secondary | ICD-10-CM | POA: Diagnosis not present

## 2019-04-09 DIAGNOSIS — D649 Anemia, unspecified: Secondary | ICD-10-CM | POA: Diagnosis not present

## 2019-04-09 DIAGNOSIS — N186 End stage renal disease: Secondary | ICD-10-CM | POA: Diagnosis not present

## 2019-04-09 NOTE — Telephone Encounter (Signed)
Patient was called and informed of referral being placed. 

## 2019-04-10 DIAGNOSIS — Z992 Dependence on renal dialysis: Secondary | ICD-10-CM | POA: Diagnosis not present

## 2019-04-10 DIAGNOSIS — D649 Anemia, unspecified: Secondary | ICD-10-CM | POA: Diagnosis not present

## 2019-04-10 DIAGNOSIS — N186 End stage renal disease: Secondary | ICD-10-CM | POA: Diagnosis not present

## 2019-04-10 DIAGNOSIS — N2581 Secondary hyperparathyroidism of renal origin: Secondary | ICD-10-CM | POA: Diagnosis not present

## 2019-04-12 DIAGNOSIS — N2581 Secondary hyperparathyroidism of renal origin: Secondary | ICD-10-CM | POA: Diagnosis not present

## 2019-04-12 DIAGNOSIS — Z992 Dependence on renal dialysis: Secondary | ICD-10-CM | POA: Diagnosis not present

## 2019-04-12 DIAGNOSIS — E1122 Type 2 diabetes mellitus with diabetic chronic kidney disease: Secondary | ICD-10-CM | POA: Diagnosis not present

## 2019-04-12 DIAGNOSIS — N186 End stage renal disease: Secondary | ICD-10-CM | POA: Diagnosis not present

## 2019-04-13 DIAGNOSIS — Z992 Dependence on renal dialysis: Secondary | ICD-10-CM | POA: Diagnosis not present

## 2019-04-13 DIAGNOSIS — N186 End stage renal disease: Secondary | ICD-10-CM | POA: Diagnosis not present

## 2019-04-13 DIAGNOSIS — N2581 Secondary hyperparathyroidism of renal origin: Secondary | ICD-10-CM | POA: Diagnosis not present

## 2019-04-16 ENCOUNTER — Telehealth (HOSPITAL_COMMUNITY): Payer: Self-pay

## 2019-04-16 DIAGNOSIS — Z992 Dependence on renal dialysis: Secondary | ICD-10-CM | POA: Diagnosis not present

## 2019-04-16 DIAGNOSIS — N186 End stage renal disease: Secondary | ICD-10-CM | POA: Diagnosis not present

## 2019-04-16 DIAGNOSIS — N2581 Secondary hyperparathyroidism of renal origin: Secondary | ICD-10-CM | POA: Diagnosis not present

## 2019-04-16 NOTE — Telephone Encounter (Signed)

## 2019-04-17 ENCOUNTER — Ambulatory Visit: Payer: Medicare Other | Admitting: Vascular Surgery

## 2019-04-17 ENCOUNTER — Encounter: Payer: Self-pay | Admitting: Vascular Surgery

## 2019-04-17 ENCOUNTER — Ambulatory Visit (INDEPENDENT_AMBULATORY_CARE_PROVIDER_SITE_OTHER)
Admission: RE | Admit: 2019-04-17 | Discharge: 2019-04-17 | Disposition: A | Discharge status: Home / Self Care | Payer: Medicare Other | Source: Ambulatory Visit | Attending: Vascular Surgery | Admitting: Vascular Surgery

## 2019-04-17 ENCOUNTER — Other Ambulatory Visit: Payer: Self-pay

## 2019-04-17 ENCOUNTER — Ambulatory Visit (INDEPENDENT_AMBULATORY_CARE_PROVIDER_SITE_OTHER): Payer: Medicare Other | Admitting: Vascular Surgery

## 2019-04-17 ENCOUNTER — Other Ambulatory Visit (HOSPITAL_COMMUNITY): Payer: Self-pay | Admitting: Vascular Surgery

## 2019-04-17 ENCOUNTER — Encounter (HOSPITAL_COMMUNITY): Payer: Medicare Other

## 2019-04-17 ENCOUNTER — Ambulatory Visit (HOSPITAL_COMMUNITY)
Admission: RE | Admit: 2019-04-17 | Discharge: 2019-04-17 | Disposition: A | Discharge status: Home / Self Care | Payer: Medicare Other | Source: Ambulatory Visit | Attending: Vascular Surgery | Admitting: Vascular Surgery

## 2019-04-17 VITALS — BP 157/67 | HR 82 | Temp 97.5°F | Resp 16 | Ht 65.0 in | Wt 149.0 lb

## 2019-04-17 DIAGNOSIS — E1122 Type 2 diabetes mellitus with diabetic chronic kidney disease: Secondary | ICD-10-CM | POA: Diagnosis not present

## 2019-04-17 DIAGNOSIS — I739 Peripheral vascular disease, unspecified: Secondary | ICD-10-CM

## 2019-04-17 DIAGNOSIS — Z992 Dependence on renal dialysis: Secondary | ICD-10-CM | POA: Diagnosis not present

## 2019-04-17 DIAGNOSIS — Z09 Encounter for follow-up examination after completed treatment for conditions other than malignant neoplasm: Secondary | ICD-10-CM | POA: Diagnosis not present

## 2019-04-17 DIAGNOSIS — N2581 Secondary hyperparathyroidism of renal origin: Secondary | ICD-10-CM | POA: Diagnosis not present

## 2019-04-17 DIAGNOSIS — N186 End stage renal disease: Secondary | ICD-10-CM | POA: Diagnosis not present

## 2019-04-17 NOTE — Progress Notes (Signed)
Patient name: Susan Horton MRN: [id number] DOB: 03-Nov-1970 Sex: female  REASON FOR VISIT: 3 month follow-up with ABI's status post left SFA stent for bilateral lower extremity short distance lifestyle limiting claudication  HPI: Susan Horton is a 49 y.o. female with multiple medical problems as listed below including DM and ESRD that presents for 3 month follow-up status post left SFA angioplasty with stent placement on 01/10/2019 for short distance lifestyle limiting claudication.  She thinks the left leg is doing much better than the right leg after stent placement.  States the right leg really bothers her and cannot get around grocery store and riding motorized cart.  No active tissue loss.  Dialysis now M, Tues, Thurs, Fri.    Past Medical History:  Diagnosis Date  . Abnormal stress test   . Anemia   . Angio-edema   . Asthma   . CAD (coronary artery disease)    DES to mid LAD July 2018, residual moderate RCA disease  . Cataract   . CKD (chronic kidney disease) stage 4, GFR 15-29 ml/min (HCC)    Dialysis T/Th/Sa  . DVT (deep venous thrombosis) ([place])    1996 during pregnancy, 2015 left leg  . Eczema   . Essential hypertension 12/11/2015  . Gastroparesis   . GERD (gastroesophageal reflux disease)   . Gluteal abscess 12/27/2018  . Hidradenitis   . Migraine   . Neuropathy   . Peripheral vascular disease ([place])    blood clot in leg  . Progressive angina ([place]) 06/05/2014   Chest pain  . Type 2 diabetes mellitus ([place])   . Type II diabetes mellitus ([place])   . Urticaria     Past Surgical History:  Procedure Laterality Date  . A/V FISTULAGRAM N/A 06/22/2017   Procedure: A/V FISTULAGRAM - left arm;  Surgeon: [name], MD;  Location: [facility];  Service: Cardiovascular;  Laterality: N/A;  . ABDOMINAL AORTOGRAM W/LOWER EXTREMITY Bilateral 12/14/2018   Procedure: ABDOMINAL AORTOGRAM W/LOWER EXTREMITY;  Surgeon: [name], MD;  Location: [facility];  Service: Cardiovascular;  Laterality: Bilateral;  . ABDOMINOPLASTY    . ADENOIDECTOMY    . APPENDECTOMY  1995  . AV FISTULA PLACEMENT Left 02/01/2017   Procedure: ARTERIOVENOUS BRACHIOCEPHALIC (AV) FISTULA CREATION;  Surgeon: [name], MD;  Location: [place];  Service: Vascular;  Laterality: Left;  . CHOLECYSTECTOMY  1993  . CORONARY ANGIOPLASTY WITH STENT PLACEMENT    . CORONARY STENT INTERVENTION N/A 08/05/2016   Procedure: Coronary Stent Intervention;  Surgeon: [name], MD;  Location: [facility];  Service: Cardiovascular;  Laterality: N/A;  . EXPLORATORY LAPAROTOMY  08/14/2005   lysis of adhesions, drainage of tubo-ovarian abscess  . FISTULA SUPERFICIALIZATION Left 09/14/2017   Procedure: FISTULA SUPERFICIALIZATION ARTERIOVENOUS FISTULA LEFT ARM;  Surgeon: [name], MD;  Location: [place];  Service: Vascular;  Laterality: Left;  . FLEXIBLE SIGMOIDOSCOPY N/A 01/04/2019   Procedure: FLEXIBLE SIGMOIDOSCOPY;  Surgeon: Clarene Essex, MD;  Location: [place];  Service: Endoscopy;  Laterality: N/A;  . HYDRADENITIS EXCISION  02/08/2011   Procedure: EXCISION HYDRADENITIS GROIN;  Surgeon: [name], MD;  Location: [place];  Service: General;  Laterality: N/A;  Excisioin of Hidradenitis Left groin  . INCISION AND DRAINAGE ABSCESS N/A 01/11/2019   Procedure: INCISION AND DRAINAGE SACRAL ABSCESS;  Surgeon: Georganna Skeans, MD;  Location: [place];  Service: General;  Laterality: N/A;  . INGUINAL HIDRADENITIS EXCISION  07/06/2010   bilateral  . INSERTION OF DIALYSIS CATHETER N/A 09/14/2017   Procedure: INSERTION OF TUNNELED DIALYSIS CATHETER Right Internal Jugular;  Surgeon: [name], MD;  Location: [place];  Service: Vascular;  Laterality: N/A;  . IR FLUORO GUIDE CV LINE RIGHT  03/01/2019  . IR US GUIDE BX ASP/DRAIN  01/01/2019  . IR US GUIDE VASC ACCESS RIGHT  03/01/2019  . LEFT HEART CATH AND CORONARY ANGIOGRAPHY N/A 08/05/2016    Procedure: Left Heart Cath and Coronary Angiography;  Surgeon: [name], MD;  Location: [place] [place] [facility];  Service: Cardiovascular;  Laterality: N/A;  . PERIPHERAL VASCULAR INTERVENTION Left 12/14/2018   Procedure: PERIPHERAL VASCULAR INTERVENTION;  Surgeon: [name], MD;  Location: [place] [facility];  Service: Cardiovascular;  Laterality: Left;  SFA  . REDUCTION MAMMAPLASTY  2002  . TONSILLECTOMY    . TUBAL LIGATION  1996  . VAGINAL HYSTERECTOMY  08/04/2005   and cysto    Family History  Problem Relation Age of Onset  . Allergic rhinitis Mother   . Hypertension Father   . Allergic rhinitis Father   . Hypertension Sister   . Allergic rhinitis Sister   . Hypertension Brother   . Allergic rhinitis Brother   . Breast cancer Cousin        x2    SOCIAL HISTORY: Social History   Tobacco Use  . Smoking status: Former Smoker    Years: 20.00    Types: Cigarettes    Quit date: 11/2016    Years since quitting: 2.4  . Smokeless tobacco: Never Used  Substance Use Topics  . Alcohol use: No    Alcohol/week: 0.0 standard drinks    Allergies  Allergen Reactions  . Repatha [Evolocumab] Itching  . Other Itching  . Lisinopril Cough    Current Outpatient Medications  Medication Sig Dispense Refill  . albuterol (PROVENTIL HFA;VENTOLIN HFA) 108 (90 BASE) MCG/ACT inhaler Inhale 1-2 puffs into the lungs every 6 (six) hours as needed for wheezing or shortness of breath.     . Alirocumab (PRALUENT) 75 MG/ML SOAJ Inject 75 mg into the skin every 14 (fourteen) days. 2 pen 11  . amitriptyline (ELAVIL) 10 MG tablet Take 1 tablet (10 mg total) by mouth at bedtime. 30 tablet 1  . aspirin 81 MG chewable tablet Chew 1 tablet (81 mg total) by mouth daily. 30 tablet 0  . atorvastatin (LIPITOR) 40 MG tablet Take 1 tablet (40 mg total) by mouth daily. 90 tablet 3  . B Complex-C-Zn-Folic Acid (DIALYVITE 983 WITH ZINC) 0.8 MG TABS Take 1 tablet by mouth daily.    . calcitRIOL  (ROCALTROL) 0.5 MCG capsule Take 1.5 mcg by mouth daily.    . calcium acetate (PHOSLO) 667 MG capsule Take 1,334-2,001 mg by mouth See admin instructions. Take three capsules (2001 mg) by mouth up to three times daily with meals and two capsules (1334 mg) with snacks  10  . clopidogrel (PLAVIX) 75 MG tablet Take 1 tablet (75 mg total) by mouth daily with breakfast. 90 tablet 3  . Ferrous Sulfate (IRON) 325 (65 Fe) MG TABS Take 325 mg by mouth daily. 30 each 3  . Fluticasone-Salmeterol (ADVAIR) 250-50 MCG/DOSE AEPB Inhale 1 puff into the lungs 2 (two) times daily. (Patient taking differently: Inhale 1 puff into the lungs daily as needed (shortness of breath). ) 60 each 3  . gentamicin cream (GARAMYCIN) 0.1 % Apply 1 application topically See admin instructions. Apply to access  site nightly    . insulin aspart (NOVOLOG) 100 UNIT/ML injection 0-6 Units, Subcutaneous, 3 times daily with meals CBG < 70: Implement Hypoglycemia measures/call MD CBG 70 - 120: 0 units CBG 121 - 150: 0 units CBG 151 - 200: 1 unit CBG 201-250: 2 units CBG 251-300: 3 units CBG 301-350: 4 units CBG 351-400: 5 units CBG > 400: Give 6 units and call MD 10 mL 0  . Insulin Glargine, 1 Unit Dial, (TOUJEO SOLOSTAR) 300 UNIT/ML SOPN Inject 5 Units into the skin 2 (two) times daily. 12 pen 3  . lidocaine-prilocaine (EMLA) cream Apply 1 application topically See admin instructions. Apply small amount to access site (AVF) 30 minutes before hemodialysis, cover with occlusive dressing (Saran Wrap) if going out to dialysis center  5  . methocarbamol (ROBAXIN) 500 MG tablet Take 1 tablet (500 mg total) by mouth 2 (two) times daily as needed for muscle spasms. 60 tablet 1  . metoCLOPramide (REGLAN) 5 MG tablet TAKE 1 TABLET BY MOUTH 4 TIMES DAILY BEFORE MEALS AND AT BEDTIME. (Patient taking differently: Take 5 mg by mouth 4 (four) times daily -  before meals and at bedtime. ) 90 tablet 0  . metoprolol succinate (TOPROL-XL) 50 MG 24 hr tablet Take 1  tablet (50 mg total) by mouth daily. 90 tablet 3  . montelukast (SINGULAIR) 10 MG tablet Take 1 tablet (10 mg total) by mouth at bedtime. (Patient taking differently: Take 10 mg by mouth at bedtime as needed (allergies). ) 30 tablet 5  . nitroGLYCERIN (NITROSTAT) 0.4 MG SL tablet Place 1 tablet (0.4 mg total) under the tongue every 5 (five) minutes as needed for chest pain. 25 tablet 2  . ondansetron (ZOFRAN ODT) 4 MG disintegrating tablet Take 1 tablet (4 mg total) by mouth every 8 (eight) hours as needed for nausea or vomiting. 20 tablet 0  . pantoprazole (PROTONIX) 40 MG tablet TAKE 1 TABLET BY MOUTH DAILY. (Patient taking differently: Take 40 mg by mouth daily. ) 30 tablet 7  . pregabalin (LYRICA) 75 MG capsule Take 1 capsule (75 mg total) by mouth daily. 30 capsule 1  . trimethoprim-polymyxin b (POLYTRIM) ophthalmic solution Place 1 drop into both eyes See admin instructions. Instill one drop into both eyes four times daily for 2 days after each monthly eye injection  12  . isosorbide mononitrate (IMDUR) 30 MG 24 hr tablet Take 0.5 tablets (15 mg total) by mouth daily. 45 tablet 3   No current facility-administered medications for this visit.    REVIEW OF SYSTEMS:  [X]  denotes positive finding, [ ]  denotes negative finding Cardiac  Comments:  Chest pain or chest pressure:    Shortness of breath upon exertion:    Short of breath when lying flat:    Irregular heart rhythm:        Vascular    Pain in calf, thigh, or hip brought on by ambulation:    Pain in feet at night that wakes you up from your sleep:     Blood clot in your veins:    Leg swelling:         Pulmonary    Oxygen at home:    Productive cough:     Wheezing:         Neurologic    Sudden weakness in arms or legs:     Sudden numbness in arms or legs:     Sudden onset of difficulty speaking or slurred speech:    Temporary loss  of vision in one eye:     Problems with dizziness:         Gastrointestinal    Blood in  stool:     Vomited blood:         Genitourinary    Burning when urinating:     Blood in urine:        Psychiatric    Major depression:         Hematologic    Bleeding problems:    Problems with blood clotting too easily:        Skin    Rashes or ulcers:        Constitutional    Fever or chills:      PHYSICAL EXAM: Vitals:   04/17/19 1602  BP: (!) 157/67  Pulse: 82  Resp: 16  Temp: (!) 97.5 F (36.4 C)  TempSrc: Temporal  SpO2: 100%  Weight: 149 lb (67.6 kg)  Height: 5\' 5"  (1.651 m)    GENERAL: The patient is a well-nourished female, in no acute distress. The vital signs are documented above. CARDIAC: There is a regular rate and rhythm.  VASCULAR:  Palpable femoral pulses bilaterally PULMONARY: There is good air exchange bilaterally without wheezing or rales. ABDOMEN: Soft and non-tender with normal pitched bowel sounds.  MUSCULOSKELETAL: There are no major deformities or cyanosis. NEUROLOGIC: No focal weakness or paresthesias are detected.  DATA:   The left SFA stents widely patent on duplex today, likely distal tibial disease  ABI non-compressible right with monophasic waveform at ankle.  ABI 0.76 left.  Assessment/Plan:  49 year old female with multiple medical issues as noted above that presents for follow-up after left SFA intervention with stent placement for short distance lifestyle limiting claudication.   We discussed her symptoms with the right leg and given that she is having ongoing symptoms she would like to proceed with right leg intervention and she has diffuse SFA disease that looks amendable to endovascular intervention.  Discussed would delay if she did not see any improvements with left leg intervention and would also delay if she feels claudication is not lifestyle limiting.  She wishes to proceed.   [name], MD [facility] Office: [phone]

## 2019-04-18 ENCOUNTER — Ambulatory Visit (HOSPITAL_COMMUNITY): Payer: Medicare Other | Attending: Cardiology

## 2019-04-18 DIAGNOSIS — R0602 Shortness of breath: Secondary | ICD-10-CM | POA: Insufficient documentation

## 2019-04-19 ENCOUNTER — Telehealth: Payer: Medicare Other | Admitting: Cardiology

## 2019-04-19 ENCOUNTER — Telehealth (INDEPENDENT_AMBULATORY_CARE_PROVIDER_SITE_OTHER): Payer: Medicare Other | Admitting: Cardiology

## 2019-04-19 ENCOUNTER — Encounter: Payer: Self-pay | Admitting: Cardiology

## 2019-04-19 DIAGNOSIS — Z992 Dependence on renal dialysis: Secondary | ICD-10-CM | POA: Diagnosis not present

## 2019-04-19 DIAGNOSIS — I2510 Atherosclerotic heart disease of native coronary artery without angina pectoris: Secondary | ICD-10-CM

## 2019-04-19 DIAGNOSIS — I120 Hypertensive chronic kidney disease with stage 5 chronic kidney disease or end stage renal disease: Secondary | ICD-10-CM

## 2019-04-19 DIAGNOSIS — Z87891 Personal history of nicotine dependence: Secondary | ICD-10-CM

## 2019-04-19 DIAGNOSIS — Z7902 Long term (current) use of antithrombotics/antiplatelets: Secondary | ICD-10-CM | POA: Diagnosis not present

## 2019-04-19 DIAGNOSIS — I739 Peripheral vascular disease, unspecified: Secondary | ICD-10-CM

## 2019-04-19 DIAGNOSIS — Z955 Presence of coronary angioplasty implant and graft: Secondary | ICD-10-CM | POA: Diagnosis not present

## 2019-04-19 DIAGNOSIS — R0600 Dyspnea, unspecified: Secondary | ICD-10-CM | POA: Diagnosis not present

## 2019-04-19 DIAGNOSIS — N186 End stage renal disease: Secondary | ICD-10-CM | POA: Diagnosis not present

## 2019-04-19 NOTE — Patient Instructions (Signed)
Medication Instructions:   Your physician recommends that you continue on your current medications as directed. Please refer to the Current Medication list given to you today.  *If you need a refill on your cardiac medications before your next appointment, please call your pharmacy*  Lab Work:  None ordered today  Testing/Procedures:  None ordered today  Follow-Up: At [facility], you and your health needs are our priority.  As part of our continuing mission to provide you with exceptional heart care, we have created designated Provider Care Teams.  These Care Teams include your primary Cardiologist (physician) and Advanced Practice Providers (APPs -  Physician Assistants and Nurse Practitioners) who all work together to provide you with the care you need, when you need it.  We recommend signing up for the patient portal called "MyChart".  Sign up information is provided on this After Visit Summary.  MyChart is used to connect with patients for Virtual Visits (Telemedicine).  Patients are able to view lab/test results, encounter notes, upcoming appointments, etc.  Non-urgent messages can be sent to your provider as well.   To learn more about what you can do with MyChart, go to [email or web address].    Your next appointment:   3-4 month(s)  The format for your next appointment:   In Person  Provider:   Quay Burow, MD

## 2019-04-19 NOTE — Progress Notes (Signed)
Virtual Visit via Video Note   This visit type was conducted due to national recommendations for restrictions regarding the COVID-19 Pandemic (e.g. social distancing) in an effort to limit this patient's exposure and mitigate transmission in our community.  Due to her co-morbid illnesses, this patient is at least at moderate risk for complications without adequate follow up.  This format is felt to be most appropriate for this patient at this time.  All issues noted in this document were discussed and addressed.  A limited physical exam was performed with this format.  Please refer to the patient's chart for her consent to telehealth for [facility].   The patient was identified using 2 identifiers.  Date:  04/19/2019   ID:  Susan Horton, DOB 1970-04-04, MRN [id number]  Patient Location: Other:  dialysis center Provider Location: Home  PCP:  Charlott Rakes, MD  Cardiologist:  Quay Burow, MD  Electrophysiologist:  None   Evaluation Performed:  Follow-Up Visit  Chief Complaint:  none  History of Present Illness:    Susan Horton is a 49 y.o. female with history of end-stage renal disease on dialysis, insulin-dependent diabetes, coronary disease status post LAD PCI with DES in 2018-Myoview in December 2020 was low risk, peripheral vascular disease status post left SFA PTA by Dr. Carlis Abbott 01/10/2019 and treated dyslipidemia on Praluent.    The patient was just seen by Dr. Gwenlyn Found 04/03/2019.  She had complained of some shortness of breath.  Echocardiogram was done and the patient was contacted for follow-up.  Echocardiogram 04/18/2019 revealed normal LV function.  She did have moderate LVH. Diastolic parameters were normal. Pulmonary pressures were mildly elevated, RV function was normal.   The patient does have RLE claudication.  She just saw dr Carlis Abbott 04/17/2019 and apparently right leg intervention is planned. She would be an acceptable risk for this from a cardiac standpoint without further  evaluation.   The patient does not have symptoms concerning for COVID-19 infection (fever, chills, cough, or new shortness of breath).    Past Medical History:  Diagnosis Date  . Abnormal stress test   . Anemia   . Angio-edema   . Asthma   . CAD (coronary artery disease)    DES to mid LAD July 2018, residual moderate RCA disease  . Cataract   . CKD (chronic kidney disease) stage 4, GFR 15-29 ml/min (HCC)    Dialysis T/Th/Sa  . DVT (deep venous thrombosis) ([place])    1996 during pregnancy, 2015 left leg  . Eczema   . Essential hypertension 12/11/2015  . Gastroparesis   . GERD (gastroesophageal reflux disease)   . Gluteal abscess 12/27/2018  . Hidradenitis   . Migraine   . Neuropathy   . Peripheral vascular disease ([place])    blood clot in leg  . Progressive angina ([place]) 06/05/2014   Chest pain  . Type 2 diabetes mellitus ([place])   . Type II diabetes mellitus ([place])   . Urticaria    Past Surgical History:  Procedure Laterality Date  . A/V FISTULAGRAM N/A 06/22/2017   Procedure: A/V FISTULAGRAM - left arm;  Surgeon: [name], MD;  Location: [place] [facility];  Service: Cardiovascular;  Laterality: N/A;  . ABDOMINAL AORTOGRAM W/LOWER EXTREMITY Bilateral 12/14/2018   Procedure: ABDOMINAL AORTOGRAM W/LOWER EXTREMITY;  Surgeon: [name], MD;  Location: [place] [facility];  Service: Cardiovascular;  Laterality: Bilateral;  . ABDOMINOPLASTY    . ADENOIDECTOMY    . APPENDECTOMY  1995  .  AV FISTULA PLACEMENT Left 02/01/2017   Procedure: ARTERIOVENOUS BRACHIOCEPHALIC (AV) FISTULA CREATION;  Surgeon: [name], MD;  Location: [place];  Service: Vascular;  Laterality: Left;  . CHOLECYSTECTOMY  1993  . CORONARY ANGIOPLASTY WITH STENT PLACEMENT    . CORONARY STENT INTERVENTION N/A 08/05/2016   Procedure: Coronary Stent Intervention;  Surgeon: [name], MD;  Location: [place] [facility];  Service: Cardiovascular;  Laterality: N/A;  . EXPLORATORY  LAPAROTOMY  08/14/2005   lysis of adhesions, drainage of tubo-ovarian abscess  . FISTULA SUPERFICIALIZATION Left 09/14/2017   Procedure: FISTULA SUPERFICIALIZATION ARTERIOVENOUS FISTULA LEFT ARM;  Surgeon: [name], MD;  Location: [place];  Service: Vascular;  Laterality: Left;  . FLEXIBLE SIGMOIDOSCOPY N/A 01/04/2019   Procedure: FLEXIBLE SIGMOIDOSCOPY;  Surgeon: Clarene Essex, MD;  Location: [place];  Service: Endoscopy;  Laterality: N/A;  . HYDRADENITIS EXCISION  02/08/2011   Procedure: EXCISION HYDRADENITIS GROIN;  Surgeon: [name], MD;  Location: [place];  Service: General;  Laterality: N/A;  Excisioin of Hidradenitis Left groin  . INCISION AND DRAINAGE ABSCESS N/A 01/11/2019   Procedure: INCISION AND DRAINAGE SACRAL ABSCESS;  Surgeon: Georganna Skeans, MD;  Location: [place];  Service: General;  Laterality: N/A;  . INGUINAL HIDRADENITIS EXCISION  07/06/2010   bilateral  . INSERTION OF DIALYSIS CATHETER N/A 09/14/2017   Procedure: INSERTION OF TUNNELED DIALYSIS CATHETER Right Internal Jugular;  Surgeon: [name], MD;  Location: [place];  Service: Vascular;  Laterality: N/A;  . IR FLUORO GUIDE CV LINE RIGHT  03/01/2019  . IR US GUIDE BX ASP/DRAIN  01/01/2019  . IR US GUIDE VASC ACCESS RIGHT  03/01/2019  . LEFT HEART CATH AND CORONARY ANGIOGRAPHY N/A 08/05/2016   Procedure: Left Heart Cath and Coronary Angiography;  Surgeon: [name], MD;  Location: [place] [facility];  Service: Cardiovascular;  Laterality: N/A;  . PERIPHERAL VASCULAR INTERVENTION Left 12/14/2018   Procedure: PERIPHERAL VASCULAR INTERVENTION;  Surgeon: [name], MD;  Location: [place] [facility];  Service: Cardiovascular;  Laterality: Left;  SFA  . REDUCTION MAMMAPLASTY  2002  . TONSILLECTOMY    . TUBAL LIGATION  1996  . VAGINAL HYSTERECTOMY  08/04/2005   and cysto     Current Meds  Medication Sig  . albuterol (PROVENTIL HFA;VENTOLIN HFA) 108 (90 BASE) MCG/ACT  inhaler Inhale 1-2 puffs into the lungs every 6 (six) hours as needed for wheezing or shortness of breath.   . Alirocumab (PRALUENT) 75 MG/ML SOAJ Inject 75 mg into the skin every 14 (fourteen) days.  [name] amitriptyline (ELAVIL) 10 MG tablet Take 1 tablet (10 mg total) by mouth at bedtime.  [name] aspirin 81 MG chewable tablet Chew 1 tablet (81 mg total) by mouth daily.  [name] atorvastatin (LIPITOR) 40 MG tablet Take 1 tablet (40 mg total) by mouth daily.  . B Complex-C-Zn-Folic Acid (DIALYVITE 465 WITH ZINC) 0.8 MG TABS Take 1 tablet by mouth daily.  . calcitRIOL (ROCALTROL) 0.5 MCG capsule Take 1.5 mcg by mouth daily.  . calcium acetate (PHOSLO) 667 MG capsule Take 1,334-2,001 mg by mouth See admin instructions. Take three capsules (2001 mg) by mouth up to three times daily with meals and two capsules (1334 mg) with snacks  . clopidogrel (PLAVIX) 75 MG tablet Take 1 tablet (75 mg total) by mouth daily with breakfast.  . Ferrous Sulfate (IRON) 325 (65 Fe) MG TABS Take 325 mg by mouth daily.  . Fluticasone-Salmeterol (ADVAIR) 250-50 MCG/DOSE AEPB  Inhale 1 puff into the lungs 2 (two) times daily.  [name] gentamicin cream (GARAMYCIN) 0.1 % Apply 1 application topically See admin instructions. Apply to access site nightly  . insulin aspart (NOVOLOG) 100 UNIT/ML injection 0-6 Units, Subcutaneous, 3 times daily with meals CBG < 70: Implement Hypoglycemia measures/call MD CBG 70 - 120: 0 units CBG 121 - 150: 0 units CBG 151 - 200: 1 unit CBG 201-250: 2 units CBG 251-300: 3 units CBG 301-350: 4 units CBG 351-400: 5 units CBG > 400: Give 6 units and call MD  . insulin glargine, 2 Unit Dial, (TOUJEO MAX SOLOSTAR) 300 UNIT/ML Solostar Pen Inject 50 Units into the skin in the morning and at bedtime.  . isosorbide mononitrate (IMDUR) 30 MG 24 hr tablet Take 0.5 tablets (15 mg total) by mouth daily.  [name] lidocaine-prilocaine (EMLA) cream Apply 1 application topically See admin instructions. Apply small amount to access site (AVF) 30  minutes before hemodialysis, cover with occlusive dressing (Saran Wrap) if going out to dialysis center  . methocarbamol (ROBAXIN) 500 MG tablet Take 1 tablet (500 mg total) by mouth 2 (two) times daily as needed for muscle spasms.  . metoCLOPramide (REGLAN) 5 MG tablet TAKE 1 TABLET BY MOUTH 4 TIMES DAILY BEFORE MEALS AND AT BEDTIME.  . metoprolol succinate (TOPROL-XL) 50 MG 24 hr tablet Take 1 tablet (50 mg total) by mouth daily.  . montelukast (SINGULAIR) 10 MG tablet Take 1 tablet (10 mg total) by mouth at bedtime.  . nitroGLYCERIN (NITROSTAT) 0.4 MG SL tablet Place 1 tablet (0.4 mg total) under the tongue every 5 (five) minutes as needed for chest pain.  [name] ondansetron (ZOFRAN ODT) 4 MG disintegrating tablet Take 1 tablet (4 mg total) by mouth every 8 (eight) hours as needed for nausea or vomiting.  . pantoprazole (PROTONIX) 40 MG tablet TAKE 1 TABLET BY MOUTH DAILY.  [name] pregabalin (LYRICA) 75 MG capsule Take 1 capsule (75 mg total) by mouth daily.  [name] trimethoprim-polymyxin b (POLYTRIM) ophthalmic solution Place 1 drop into both eyes See admin instructions. Instill one drop into both eyes four times daily for 2 days after each monthly eye injection     Allergies:   Repatha [evolocumab], Other, and Lisinopril   Social History   Tobacco Use  . Smoking status: Former Smoker    Years: 20.00    Types: Cigarettes    Quit date: 11/2016    Years since quitting: 2.4  . Smokeless tobacco: Never Used  Substance Use Topics  . Alcohol use: No    Alcohol/week: 0.0 standard drinks  . Drug use: No     Family Hx: The patient's family history includes Allergic rhinitis in her brother, father, mother, and sister; Breast cancer in her cousin; Hypertension in her brother, father, and sister.  ROS:   Please see the history of present illness.    All other systems reviewed and are negative.   Prior CV studies:   The following studies were reviewed today: Echo 04/18/2019  Labs/Other Tests and Data  Reviewed:    EKG:  An ECG dated 04/03/2019 was personally reviewed today and demonstrated:  NSR-HR 90, LAE, NSST TW changes  Recent Labs: 02/24/2019: ALT 16 03/02/2019: BUN 67; Creatinine, Ser 11.52; Hemoglobin 9.1; Platelets 310; Potassium 4.0; Sodium 138   Recent Lipid Panel Lab Results  Component Value Date/Time   CHOL 152 01/19/2019 02:48 PM   TRIG 149 01/19/2019 02:48 PM   HDL 36 (L) 01/19/2019 02:48 PM   CHOLHDL  4.2 01/19/2019 02:48 PM   CHOLHDL 7.2 Ratio 01/01/2010 08:55 PM   LDLCALC 90 01/19/2019 02:48 PM    Wt Readings from Last 3 Encounters:  04/19/19 158 lb (71.7 kg)  04/17/19 149 lb (67.6 kg)  04/03/19 151 lb (68.5 kg)     Objective:    Vital Signs:  BP (!) 180/84   Pulse 92   Ht 5\' 5"  (1.651 m)   Wt 158 lb (71.7 kg)   BMI 26.29 kg/m    VITAL SIGNS:  reviewed  ASSESSMENT & PLAN:    Dyspnea- Echo showed normal LVF, moderate LVH, no diastolic dysfunction.  Her symptoms have improved.  CAD- S/P LAD PCI with DES 2018. Residual moderate RCA disease-medical Rx. Myoview low risk Dec 2020.  ESRD- On HD  PVD- S/P LSFA PTA by Dr Carlis Abbott 01/10/2020- she now may need intervention on her RLE.   HTN- B/P usually runs high- somewhat unusual in a HD patient- will defer to her nephrologist.   Plan: No change in Rx- f/u with Dr Gwenlyn Found 3-4 months in office.   COVID-19 Education: The signs and symptoms of COVID-19 were discussed with the patient and how to seek care for testing (follow up with PCP or arrange E-visit).  The importance of social distancing was discussed today.  Time:   Today, I have spent 15 minutes with the patient with telehealth technology discussing the above problems.     Medication Adjustments/Labs and Tests Ordered: Current medicines are reviewed at length with the patient today.  Concerns regarding medicines are outlined above.   Tests Ordered: No orders of the defined types were placed in this encounter.   Medication Changes: No orders  of the defined types were placed in this encounter.   Follow Up:  In Person Dr Gwenlyn Found 3-4 months  Signed, Kerin Ransom, PA-C  04/19/2019 2:25 PM    [facility]

## 2019-04-21 ENCOUNTER — Other Ambulatory Visit (HOSPITAL_COMMUNITY): Payer: Medicare Other

## 2019-04-23 DIAGNOSIS — Z992 Dependence on renal dialysis: Secondary | ICD-10-CM | POA: Diagnosis not present

## 2019-04-23 DIAGNOSIS — N2581 Secondary hyperparathyroidism of renal origin: Secondary | ICD-10-CM | POA: Diagnosis not present

## 2019-04-23 DIAGNOSIS — R82998 Other abnormal findings in urine: Secondary | ICD-10-CM | POA: Diagnosis not present

## 2019-04-23 DIAGNOSIS — N186 End stage renal disease: Secondary | ICD-10-CM | POA: Diagnosis not present

## 2019-04-23 DIAGNOSIS — I10 Essential (primary) hypertension: Secondary | ICD-10-CM | POA: Diagnosis not present

## 2019-04-23 DIAGNOSIS — D509 Iron deficiency anemia, unspecified: Secondary | ICD-10-CM | POA: Diagnosis not present

## 2019-04-23 DIAGNOSIS — Z4931 Encounter for adequacy testing for hemodialysis: Secondary | ICD-10-CM | POA: Diagnosis not present

## 2019-04-23 DIAGNOSIS — D649 Anemia, unspecified: Secondary | ICD-10-CM | POA: Diagnosis not present

## 2019-04-23 DIAGNOSIS — E440 Moderate protein-calorie malnutrition: Secondary | ICD-10-CM | POA: Diagnosis not present

## 2019-04-24 ENCOUNTER — Encounter (HOSPITAL_COMMUNITY): Payer: Medicare Other

## 2019-04-24 ENCOUNTER — Ambulatory Visit: Payer: Medicare Other | Admitting: Vascular Surgery

## 2019-04-24 DIAGNOSIS — Z992 Dependence on renal dialysis: Secondary | ICD-10-CM | POA: Diagnosis not present

## 2019-04-24 DIAGNOSIS — D509 Iron deficiency anemia, unspecified: Secondary | ICD-10-CM | POA: Diagnosis not present

## 2019-04-24 DIAGNOSIS — N2581 Secondary hyperparathyroidism of renal origin: Secondary | ICD-10-CM | POA: Diagnosis not present

## 2019-04-24 DIAGNOSIS — E440 Moderate protein-calorie malnutrition: Secondary | ICD-10-CM | POA: Diagnosis not present

## 2019-04-24 DIAGNOSIS — N186 End stage renal disease: Secondary | ICD-10-CM | POA: Diagnosis not present

## 2019-04-24 DIAGNOSIS — Z4931 Encounter for adequacy testing for hemodialysis: Secondary | ICD-10-CM | POA: Diagnosis not present

## 2019-04-25 ENCOUNTER — Other Ambulatory Visit: Payer: Self-pay

## 2019-04-25 ENCOUNTER — Encounter (HOSPITAL_COMMUNITY)
Admission: RE | Disposition: A | Discharge status: Home / Self Care | Payer: Self-pay | Source: Home / Self Care | Attending: Vascular Surgery

## 2019-04-25 ENCOUNTER — Ambulatory Visit (HOSPITAL_COMMUNITY)
Admission: RE | Admit: 2019-04-25 | Discharge: 2019-04-25 | Disposition: A | Discharge status: Home / Self Care | Payer: Medicare Other | Attending: Vascular Surgery | Admitting: Vascular Surgery

## 2019-04-25 DIAGNOSIS — Z4931 Encounter for adequacy testing for hemodialysis: Secondary | ICD-10-CM | POA: Diagnosis not present

## 2019-04-25 DIAGNOSIS — N184 Chronic kidney disease, stage 4 (severe): Secondary | ICD-10-CM | POA: Insufficient documentation

## 2019-04-25 DIAGNOSIS — Z7951 Long term (current) use of inhaled steroids: Secondary | ICD-10-CM | POA: Diagnosis not present

## 2019-04-25 DIAGNOSIS — I2510 Atherosclerotic heart disease of native coronary artery without angina pectoris: Secondary | ICD-10-CM | POA: Diagnosis not present

## 2019-04-25 DIAGNOSIS — Z7982 Long term (current) use of aspirin: Secondary | ICD-10-CM | POA: Insufficient documentation

## 2019-04-25 DIAGNOSIS — E1143 Type 2 diabetes mellitus with diabetic autonomic (poly)neuropathy: Secondary | ICD-10-CM | POA: Diagnosis not present

## 2019-04-25 DIAGNOSIS — Z992 Dependence on renal dialysis: Secondary | ICD-10-CM | POA: Diagnosis not present

## 2019-04-25 DIAGNOSIS — E1151 Type 2 diabetes mellitus with diabetic peripheral angiopathy without gangrene: Secondary | ICD-10-CM | POA: Insufficient documentation

## 2019-04-25 DIAGNOSIS — E1136 Type 2 diabetes mellitus with diabetic cataract: Secondary | ICD-10-CM | POA: Diagnosis not present

## 2019-04-25 DIAGNOSIS — K3184 Gastroparesis: Secondary | ICD-10-CM | POA: Diagnosis not present

## 2019-04-25 DIAGNOSIS — D509 Iron deficiency anemia, unspecified: Secondary | ICD-10-CM | POA: Diagnosis not present

## 2019-04-25 DIAGNOSIS — J45909 Unspecified asthma, uncomplicated: Secondary | ICD-10-CM | POA: Diagnosis not present

## 2019-04-25 DIAGNOSIS — I129 Hypertensive chronic kidney disease with stage 1 through stage 4 chronic kidney disease, or unspecified chronic kidney disease: Secondary | ICD-10-CM | POA: Insufficient documentation

## 2019-04-25 DIAGNOSIS — Z87891 Personal history of nicotine dependence: Secondary | ICD-10-CM | POA: Diagnosis not present

## 2019-04-25 DIAGNOSIS — I70213 Atherosclerosis of native arteries of extremities with intermittent claudication, bilateral legs: Secondary | ICD-10-CM | POA: Diagnosis not present

## 2019-04-25 DIAGNOSIS — Z79899 Other long term (current) drug therapy: Secondary | ICD-10-CM | POA: Insufficient documentation

## 2019-04-25 DIAGNOSIS — Z888 Allergy status to other drugs, medicaments and biological substances status: Secondary | ICD-10-CM | POA: Insufficient documentation

## 2019-04-25 DIAGNOSIS — I70211 Atherosclerosis of native arteries of extremities with intermittent claudication, right leg: Secondary | ICD-10-CM

## 2019-04-25 DIAGNOSIS — K219 Gastro-esophageal reflux disease without esophagitis: Secondary | ICD-10-CM | POA: Diagnosis not present

## 2019-04-25 DIAGNOSIS — Z794 Long term (current) use of insulin: Secondary | ICD-10-CM | POA: Diagnosis not present

## 2019-04-25 DIAGNOSIS — N186 End stage renal disease: Secondary | ICD-10-CM | POA: Diagnosis not present

## 2019-04-25 DIAGNOSIS — E440 Moderate protein-calorie malnutrition: Secondary | ICD-10-CM | POA: Diagnosis not present

## 2019-04-25 DIAGNOSIS — N2581 Secondary hyperparathyroidism of renal origin: Secondary | ICD-10-CM | POA: Diagnosis not present

## 2019-04-25 DIAGNOSIS — Z7902 Long term (current) use of antithrombotics/antiplatelets: Secondary | ICD-10-CM | POA: Diagnosis not present

## 2019-04-25 DIAGNOSIS — E1122 Type 2 diabetes mellitus with diabetic chronic kidney disease: Secondary | ICD-10-CM | POA: Diagnosis not present

## 2019-04-25 DIAGNOSIS — Z86718 Personal history of other venous thrombosis and embolism: Secondary | ICD-10-CM | POA: Insufficient documentation

## 2019-04-25 DIAGNOSIS — Z955 Presence of coronary angioplasty implant and graft: Secondary | ICD-10-CM | POA: Diagnosis not present

## 2019-04-25 HISTORY — PX: ABDOMINAL AORTOGRAM W/LOWER EXTREMITY: CATH118223

## 2019-04-25 HISTORY — PX: PERIPHERAL VASCULAR INTERVENTION: CATH118257

## 2019-04-25 LAB — POCT I-STAT, CHEM 8
BUN: 40 mg/dL — ABNORMAL HIGH (ref 6–20)
Calcium, Ion: 1.2 mmol/L (ref 1.15–1.40)
Chloride: 100 mmol/L (ref 98–111)
Creatinine, Ser: 5.8 mg/dL — ABNORMAL HIGH (ref 0.44–1.00)
Glucose, Bld: 110 mg/dL — ABNORMAL HIGH (ref 70–99)
HCT: 33 % — ABNORMAL LOW (ref 36.0–46.0)
Hemoglobin: 11.2 g/dL — ABNORMAL LOW (ref 12.0–15.0)
Potassium: 3.4 mmol/L — ABNORMAL LOW (ref 3.5–5.1)
Sodium: 140 mmol/L (ref 135–145)
TCO2: 30 mmol/L (ref 22–32)

## 2019-04-25 SURGERY — ABDOMINAL AORTOGRAM W/LOWER EXTREMITY
Anesthesia: LOCAL

## 2019-04-25 MED ORDER — MIDAZOLAM HCL 2 MG/2ML IJ SOLN
INTRAMUSCULAR | Status: AC
  Filled 2019-04-25: qty 2

## 2019-04-25 MED ORDER — LABETALOL HCL 5 MG/ML IV SOLN: 10.0000 mg | INTRAVENOUS | Status: DC | PRN

## 2019-04-25 MED ORDER — FENTANYL CITRATE (PF) 100 MCG/2ML IJ SOLN
INTRAMUSCULAR | Status: DC | PRN
  Administered 2019-04-25: 50 ug via INTRAVENOUS

## 2019-04-25 MED ORDER — IODIXANOL 320 MG/ML IV SOLN
INTRAVENOUS | Status: DC | PRN
  Administered 2019-04-25: 110 mL

## 2019-04-25 MED ORDER — MIDAZOLAM HCL 2 MG/2ML IJ SOLN
INTRAMUSCULAR | Status: DC | PRN
  Administered 2019-04-25: 1 mg via INTRAVENOUS

## 2019-04-25 MED ORDER — SODIUM CHLORIDE 0.9% FLUSH: 3.0000 mL | INTRAVENOUS | Status: DC | PRN

## 2019-04-25 MED ORDER — HEPARIN (PORCINE) IN NACL 1000-0.9 UT/500ML-% IV SOLN
INTRAVENOUS | Status: AC
  Filled 2019-04-25: qty 1000

## 2019-04-25 MED ORDER — SODIUM CHLORIDE 0.9% FLUSH: 3.0000 mL | Freq: Two times a day (BID) | INTRAVENOUS | Status: DC

## 2019-04-25 MED ORDER — SODIUM CHLORIDE 0.9 % IV SOLN: 250.0000 mL | INTRAVENOUS | Status: DC | PRN

## 2019-04-25 MED ORDER — HEPARIN (PORCINE) IN NACL 1000-0.9 UT/500ML-% IV SOLN
INTRAVENOUS | Status: DC | PRN
  Administered 2019-04-25 (×2): 500 mL

## 2019-04-25 MED ORDER — SODIUM CHLORIDE 0.9 % IV SOLN: INTRAVENOUS | Status: DC

## 2019-04-25 MED ORDER — HYDRALAZINE HCL 20 MG/ML IJ SOLN: 5.0000 mg | INTRAMUSCULAR | Status: DC | PRN

## 2019-04-25 MED ORDER — FENTANYL CITRATE (PF) 100 MCG/2ML IJ SOLN
INTRAMUSCULAR | Status: AC
  Filled 2019-04-25: qty 2

## 2019-04-25 MED ORDER — HEPARIN SODIUM (PORCINE) 1000 UNIT/ML IJ SOLN
INTRAMUSCULAR | Status: DC | PRN
  Administered 2019-04-25: 7000 [IU] via INTRAVENOUS

## 2019-04-25 MED ORDER — ONDANSETRON HCL 4 MG/2ML IJ SOLN: 4.0000 mg | Freq: Four times a day (QID) | INTRAMUSCULAR | Status: DC | PRN

## 2019-04-25 MED ORDER — LIDOCAINE HCL (PF) 1 % IJ SOLN
INTRAMUSCULAR | Status: DC | PRN
  Administered 2019-04-25: 18 mL

## 2019-04-25 MED ORDER — ACETAMINOPHEN 325 MG PO TABS: 650.0000 mg | ORAL_TABLET | ORAL | Status: DC | PRN

## 2019-04-25 MED ORDER — LIDOCAINE HCL (PF) 1 % IJ SOLN
INTRAMUSCULAR | Status: AC
  Filled 2019-04-25: qty 30

## 2019-04-25 SURGICAL SUPPLY — 23 items
BALLN MUSTANG 6.0X40 135 (BALLOONS) ×3
BALLOON MUSTANG 6.0X40 135 (BALLOONS) IMPLANT
CATH MUSTANG 4X200X135 (BALLOONS) ×2 IMPLANT
CATH OMNI FLUSH 5F 65CM (CATHETERS) ×2 IMPLANT
CATH QUICKCROSS SUPP .035X90CM (MICROCATHETER) ×1 IMPLANT
CLOSURE MYNX CONTROL 6F/7F (Vascular Products) ×1 IMPLANT
DEVICE TORQUE .025-.038 (MISCELLANEOUS) ×2 IMPLANT
GLIDEWIRE ADV .035X260CM (WIRE) ×2 IMPLANT
KIT ENCORE 26 ADVANTAGE (KITS) ×2 IMPLANT
KIT MICROPUNCTURE NIT STIFF (SHEATH) ×2 IMPLANT
KIT PV (KITS) ×3 IMPLANT
SHEATH FLEX ANSEL ST 6FR 45CM (SHEATH) ×1 IMPLANT
SHEATH PINNACLE 5F 10CM (SHEATH) ×1 IMPLANT
SHEATH PINNACLE 6F 10CM (SHEATH) ×1 IMPLANT
SHEATH PROBE COVER 6X72 (BAG) ×2 IMPLANT
STENT INNOVA 5X120X130 (Permanent Stent) ×1 IMPLANT
STENT INNOVA 5X150X130 (Permanent Stent) ×1 IMPLANT
STENT INNOVA 7X40X130 (Permanent Stent) ×1 IMPLANT
STOPCOCK MORSE 400PSI 3WAY (MISCELLANEOUS) ×1 IMPLANT
SYR MEDRAD MARK V 150ML (SYRINGE) ×1 IMPLANT
TRANSDUCER W/STOPCOCK (MISCELLANEOUS) ×3 IMPLANT
TRAY PV CATH (CUSTOM PROCEDURE TRAY) ×3 IMPLANT
WIRE BENTSON .035X145CM (WIRE) ×2 IMPLANT

## 2019-04-25 NOTE — Discharge Instructions (Signed)
Continue aspirin Plavix and Lipitor.  Will arrange follow-up in 1 month in our clinic.  Angiogram, Care After This sheet gives you information about how to care for yourself after your procedure. Your doctor may also give you more specific instructions. If you have problems or questions, contact your doctor. Follow these instructions at home: Insertion site care  Follow instructions from your doctor about how to take care of your long, thin tube (catheter) insertion area. Make sure you: ? Wash your hands with soap and water before you change your bandage (dressing). If you cannot use soap and water, use hand sanitizer. ? Change your bandage as told by your doctor. ? Leave stitches (sutures), skin glue, or skin tape (adhesive) strips in place. They may need to stay in place for 2 weeks or longer. If tape strips get loose and curl up, you may trim the loose edges. Do not remove tape strips completely unless your doctor says it is okay.  Do not take baths, swim, or use a hot tub until your doctor says it is okay.  You may shower 24-48 hours after the procedure or as told by your doctor. ? Gently wash the area with plain soap and water. ? Pat the area dry with a clean towel. ? Do not rub the area. This may cause bleeding.  Do not apply powder or lotion to the area. Keep the area clean and dry.  Check your insertion area every day for signs of infection. Check for: ? More redness, swelling, or pain. ? Fluid or blood. ? Warmth. ? Pus or a bad smell. Activity  Rest as told by your doctor, usually for 1-2 days.  Do not lift anything that is heavier than 10 lbs. (4.5 kg) or as told by your doctor.  Do not drive for 24 hours if you were given a medicine to help you relax (sedative).  Do not drive or use heavy machinery while taking prescription pain medicine. General instructions   Go back to your normal activities as told by your doctor, usually in about a week. Ask your doctor what  activities are safe for you.  If the insertion area starts to bleed, lie flat and put pressure on the area. If the bleeding does not stop, get help right away. This is an emergency.  Drink enough fluid to keep your pee (urine) clear or pale yellow.  Take over-the-counter and prescription medicines only as told by your doctor.  Keep all follow-up visits as told by your doctor. This is important. Contact a doctor if:  You have a fever.  You have chills.  You have more redness, swelling, or pain around your insertion area.  You have fluid or blood coming from your insertion area.  The insertion area feels warm to the touch.  You have pus or a bad smell coming from your insertion area.  You have more bruising around the insertion area.  Blood collects in the tissue around the insertion area (hematoma) that may be painful to the touch. Get help right away if:  You have a lot of pain in the insertion area.  The insertion area swells very fast.  The insertion area is bleeding, and the bleeding does not stop after holding steady pressure on the area.  The area near or just beyond the insertion area becomes pale, cool, tingly, or numb. These symptoms may be an emergency. Do not wait to see if the symptoms will go away. Get medical help right away.  Call your local emergency services (911 in the [place] Do not drive yourself to the hospital. Summary  After the procedure, it is common to have bruising and tenderness at the long, thin tube insertion area.  After the procedure, it is important to rest and drink plenty of fluids.  Do not take baths, swim, or use a hot tub until your doctor says it is okay to do so. You may shower 24-48 hours after the procedure or as told by your doctor.  If the insertion area starts to bleed, lie flat and put pressure on the area. If the bleeding does not stop, get help right away. This is an emergency. This information is not intended to replace advice  given to you by your health care provider. Make sure you discuss any questions you have with your health care provider. Document Revised: 12/10/2016 Document Reviewed: 12/23/2015 Elsevier Patient Education  2020 [employer].

## 2019-04-25 NOTE — Op Note (Signed)
Patient name: Susan Horton MRN: [id number] DOB: October 28, 1970 Sex: female  04/25/2019 Pre-operative Diagnosis: Short distance lifestyle limiting claudication of the right lower extremity with previous intervention on the left lower extremity Post-operative diagnosis:  Same Surgeon:  [name], MD Procedure Performed: 1.  Ultrasound-guided access of the left common femoral artery 2.  Aortogram 3.  Right lower extremity arteriogram with selection of third order branches 4.  Right SFA angioplasty with stent placement (predilated with a 4 mm Mustang and then stented with 5 mm x 150 mm self-expanding Innova and 5 mm x 120 mm self-expanding Innova all postdilated with 4 mm Mustang) 5.  Left external iliac angioplasty with stent placement (7 mm x 40 mm self-expanding Innova postdilated with a 6 mm Mustang) 6.  Mynx closure of the left common femoral artery 7.  64 minutes of monitored moderate conscious sedation time   Indications: [name] who is well-known to the vascular surgery service with diabetes and end-stage renal disease that presented with bilateral lower extremity short distance claudication and multi-level disease.  She presents for right lower extremity intervention after previous left lower extremity intervention for short distance lifestyle limiting claudication.  She has known long segment right SFA disease and tibial disease.  Risk and benefits were discussed prior to intervention.  Findings:   Aortogram was performed again showed a small calcified infrarenal aorta with high-grade proximal left external iliac stenosis.  Right lower extremity arteriogram showed a patent common femoral profunda she had a long segment SFA disease with near 99% occlusive disease throughout the segment of SFA over about 250 mm in proximal, mid, and distal segment.  She has a patent above and below-knee popliteal artery and appears to have single-vessel runoff via the  posterior tibial artery.  Ultimately the right SFA lesion was crossed and this was predilated with a 4 mm Mustang and then stented with 5 mm self-expanding Innova stents (small artery that only measured 4 mm in healthy segment).  She has now inline flow through the SFA with no residual stenosis.  She appears to have now two-vessel runoff via the posterior tibial and anterior tibial artery.  At the end of the case we did stent the left external iliac high-grade stenosis given that we previously intervened on the left side and she has left SFA stents and this lesion could threaten long-term patency of her stents.   Procedure:  The patient was identified in the holding area and taken to room 8.  The patient was then placed supine on the table and prepped and draped in the usual sterile fashion.  A time out was called.  Ultrasound was used to evaluate the left common femoral artery.  It was patent .  A digital ultrasound image was acquired.  A micropuncture needle was used to access the left common femoral artery under ultrasound guidance.  An 018 wire was advanced without resistance and a micropuncture sheath was placed.  The 018 wire was removed and a benson wire was placed.  The micropuncture sheath was exchanged for a 5 french sheath.  An omniflush catheter was advanced over the wire to the level of L-1.  An abdominal angiogram was obtained.  Next, using the omniflush catheter and a benson wire, the aortic bifurcation was crossed and the catheter was placed into theright external iliac artery and right runoff was obtained.  Ultimately we then performed hand injections to get updated imaging of the right lower extremity with  known SFA disease which was reconfirmed.  I then used Glidewire advantage and placed a long 6 [place] Ansell sheath in the left groin over the aortic bifurcation.  The Glidewire advantage was used to cross the SFA disease with a quick cross catheter.  We then got our catheter in the  above-knee popliteal artery and confirmed with hand-injection that we were in the true lumen.  The entire SFA segment was then angioplastied with a 4 mm Mustang to nominal pressure for 2 minutes.  Another hand-injection showed fairly long dissection in the SFA that appeared flow-limiting.  We then elected to primarily stent and could only use a 5 mm stent given that the artery only measured about 4 mm.  We selected 5 mm self-expanding Innova stent placed a 150 mm and then 120 mm self-expanding Innova throughout the SFA that was postdilated with a 4 mm Mustang.  Final injection showed inline flow down the SFA with no residual stenosis.  She had preserved runoff the right lower extremity and now appears to have two-vessel runoff via the posterior tibial and anterior tibial artery.  That point in time we elected to stop any further intervention on the right lower extremity.  I pulled the sheath back over the aortic bifurcation and we got some left iliac shots on the way out given there appeared to be a high-grade left external iliac stenosis during initial aortogram.  We confirmed on hand-injection and elected to stent this with a 7 x 40 self-expanding Innova that was postdilated with a 6 mm balloon.  That point in time we exchanged for 6 French sheath in the left groin and placed a mynx closure device.  Wires and catheters were otherwise removed.  Pain: Patient should continue aspirin Plavix statin.  Will arrange follow-up in 1 month with ABIs and lower extremity arterial duplex.  [name], MD [facility] Office: [phone]

## 2019-04-25 NOTE — H&P (Signed)
History and Physical Interval Note:  04/25/2019 2:09 PM  Susan Horton  has presented today for surgery, with the diagnosis of pad.  The various methods of treatment have been discussed with the patient and family. After consideration of risks, benefits and other options for treatment, the patient has consented to  Procedure(s): ABDOMINAL AORTOGRAM W/LOWER EXTREMITY (N/A) as a surgical intervention.  The patient's history has been reviewed, patient examined, no change in status, stable for surgery.  I have reviewed the patient's chart and labs.  Questions were answered to the patient's satisfaction.    Right leg intervention.  [name]  Patient name: Susan Horton MRN: [id number] DOB: 25-Aug-1970 Sex: female  REASON FOR VISIT: 3 month follow-up with ABI's status post left SFA stent for bilateral lower extremity short distance lifestyle limiting claudication  HPI:  Susan Horton is a 49 y.o. female with multiple medical problems as listed below including DM and ESRD that presents for 3 month follow-up status post left SFA angioplasty with stent placement on 01/10/2019 for short distance lifestyle limiting claudication. She thinks the left leg is doing much better than the right leg after stent placement. States the right leg really bothers her and cannot get around grocery store and riding motorized cart. No active tissue loss. Dialysis now M, Tues, Thurs, Fri.      Past Medical History:  Diagnosis Date  . Abnormal stress test   . Anemia   . Angio-edema   . Asthma   . CAD (coronary artery disease)    DES to mid LAD July 2018, residual moderate RCA disease  . Cataract   . CKD (chronic kidney disease) stage 4, GFR 15-29 ml/min (HCC)    Dialysis T/Th/Sa  . DVT (deep venous thrombosis) ([place])    1996 during pregnancy, 2015 left leg  . Eczema   . Essential hypertension 12/11/2015  . Gastroparesis   . GERD (gastroesophageal reflux disease)   . Gluteal abscess 12/27/2018  .  Hidradenitis   . Migraine   . Neuropathy   . Peripheral vascular disease ([place])    blood clot in leg  . Progressive angina ([place]) 06/05/2014   Chest pain  . Type 2 diabetes mellitus ([place])   . Type II diabetes mellitus ([place])   . Urticaria         Past Surgical History:  Procedure Laterality Date  . A/V FISTULAGRAM N/A 06/22/2017   Procedure: A/V FISTULAGRAM - left arm; Surgeon: [name], MD; Location: [facility]; Service: Cardiovascular; Laterality: N/A;  . ABDOMINAL AORTOGRAM W/LOWER EXTREMITY Bilateral 12/14/2018   Procedure: ABDOMINAL AORTOGRAM W/LOWER EXTREMITY; Surgeon: [name], MD; Location: [facility]; Service: Cardiovascular; Laterality: Bilateral;  . ABDOMINOPLASTY    . ADENOIDECTOMY    . APPENDECTOMY  1995  . AV FISTULA PLACEMENT Left 02/01/2017   Procedure: ARTERIOVENOUS BRACHIOCEPHALIC (AV) FISTULA CREATION; Surgeon: [name], MD; Location: [facility]; Service: Vascular; Laterality: Left;  . CHOLECYSTECTOMY  1993  . CORONARY ANGIOPLASTY WITH STENT PLACEMENT    . CORONARY STENT INTERVENTION N/A 08/05/2016   Procedure: Coronary Stent Intervention; Surgeon: [name], MD; Location: [facility]; Service: Cardiovascular; Laterality: N/A;  . EXPLORATORY LAPAROTOMY  08/14/2005   lysis of adhesions, drainage of tubo-ovarian abscess  . FISTULA SUPERFICIALIZATION Left 09/14/2017   Procedure: FISTULA SUPERFICIALIZATION ARTERIOVENOUS FISTULA LEFT ARM; Surgeon: [name], MD; Location: [facility]; Service: Vascular; Laterality: Left;  . FLEXIBLE SIGMOIDOSCOPY N/A 01/04/2019   Procedure:  FLEXIBLE SIGMOIDOSCOPY; Surgeon: Clarene Essex, MD; Location: [facility]; Service: Endoscopy; Laterality: N/A;  . HYDRADENITIS EXCISION  02/08/2011   Procedure: EXCISION HYDRADENITIS GROIN; Surgeon: [name], MD; Location: [facility]; Service: General; Laterality: N/A; Excisioin of Hidradenitis Left groin  . INCISION  AND DRAINAGE ABSCESS N/A 01/11/2019   Procedure: INCISION AND DRAINAGE SACRAL ABSCESS; Surgeon: Georganna Skeans, MD; Location: [facility]; Service: General; Laterality: N/A;  . INGUINAL HIDRADENITIS EXCISION  07/06/2010   bilateral  . INSERTION OF DIALYSIS CATHETER N/A 09/14/2017   Procedure: INSERTION OF TUNNELED DIALYSIS CATHETER Right Internal Jugular; Surgeon: [name], MD; Location: [facility]; Service: Vascular; Laterality: N/A;  . IR FLUORO GUIDE CV LINE RIGHT  03/01/2019  . IR US GUIDE BX ASP/DRAIN  01/01/2019  . IR US GUIDE VASC ACCESS RIGHT  03/01/2019  . LEFT HEART CATH AND CORONARY ANGIOGRAPHY N/A 08/05/2016   Procedure: Left Heart Cath and Coronary Angiography; Surgeon: [name], MD; Location: [facility]; Service: Cardiovascular; Laterality: N/A;  . PERIPHERAL VASCULAR INTERVENTION Left 12/14/2018   Procedure: PERIPHERAL VASCULAR INTERVENTION; Surgeon: [name], MD; Location: [facility]; Service: Cardiovascular; Laterality: Left; SFA  . REDUCTION MAMMAPLASTY  2002  . TONSILLECTOMY    . TUBAL LIGATION  1996  . VAGINAL HYSTERECTOMY  08/04/2005   and cysto        Family History  Problem Relation Age of Onset  . Allergic rhinitis Mother   . Hypertension Father   . Allergic rhinitis Father   . Hypertension Sister   . Allergic rhinitis Sister   . Hypertension Brother   . Allergic rhinitis Brother   . Breast cancer Cousin    x2   SOCIAL HISTORY:  Social History        Tobacco Use  . Smoking status: Former Smoker    Years: 20.00    Types: Cigarettes    Quit date: 11/2016    Years since quitting: 2.4  . Smokeless tobacco: Never Used  Substance Use Topics  . Alcohol use: No    Alcohol/week: 0.0 standard drinks       Allergies  Allergen Reactions  . Repatha [Evolocumab] Itching  . Other Itching  . Lisinopril Cough         Current Outpatient Medications  Medication Sig Dispense Refill  . albuterol (PROVENTIL HFA;VENTOLIN HFA)  108 (90 BASE) MCG/ACT inhaler Inhale 1-2 puffs into the lungs every 6 (six) hours as needed for wheezing or shortness of breath.     . Alirocumab (PRALUENT) 75 MG/ML SOAJ Inject 75 mg into the skin every 14 (fourteen) days. 2 pen 11  . amitriptyline (ELAVIL) 10 MG tablet Take 1 tablet (10 mg total) by mouth at bedtime. 30 tablet 1  . aspirin 81 MG chewable tablet Chew 1 tablet (81 mg total) by mouth daily. 30 tablet 0  . atorvastatin (LIPITOR) 40 MG tablet Take 1 tablet (40 mg total) by mouth daily. 90 tablet 3  . B Complex-C-Zn-Folic Acid (DIALYVITE 962 WITH ZINC) 0.8 MG TABS Take 1 tablet by mouth daily.    . calcitRIOL (ROCALTROL) 0.5 MCG capsule Take 1.5 mcg by mouth daily.    . calcium acetate (PHOSLO) 667 MG capsule Take 1,334-2,001 mg by mouth See admin instructions. Take three capsules (2001 mg) by mouth up to three times daily with meals and two capsules (1334 mg) with snacks  10  . clopidogrel (PLAVIX) 75 MG tablet Take 1 tablet (75 mg total) by mouth daily  with breakfast. 90 tablet 3  . Ferrous Sulfate (IRON) 325 (65 Fe) MG TABS Take 325 mg by mouth daily. 30 each 3  . Fluticasone-Salmeterol (ADVAIR) 250-50 MCG/DOSE AEPB Inhale 1 puff into the lungs 2 (two) times daily. (Patient taking differently: Inhale 1 puff into the lungs daily as needed (shortness of breath). ) 60 each 3  . gentamicin cream (GARAMYCIN) 0.1 % Apply 1 application topically See admin instructions. Apply to access site nightly    . insulin aspart (NOVOLOG) 100 UNIT/ML injection 0-6 Units, Subcutaneous, 3 times daily with meals CBG < 70: Implement Hypoglycemia measures/call MD CBG 70 - 120: 0 units CBG 121 - 150: 0 units CBG 151 - 200: 1 unit CBG 201-250: 2 units CBG 251-300: 3 units CBG 301-350: 4 units CBG 351-400: 5 units CBG > 400: Give 6 units and call MD 10 mL 0  . Insulin Glargine, 1 Unit Dial, (TOUJEO SOLOSTAR) 300 UNIT/ML SOPN Inject 5 Units into the skin 2 (two) times daily. 12 pen 3  . lidocaine-prilocaine (EMLA)  cream Apply 1 application topically See admin instructions. Apply small amount to access site (AVF) 30 minutes before hemodialysis, cover with occlusive dressing (Saran Wrap) if going out to dialysis center  5  . methocarbamol (ROBAXIN) 500 MG tablet Take 1 tablet (500 mg total) by mouth 2 (two) times daily as needed for muscle spasms. 60 tablet 1  . metoCLOPramide (REGLAN) 5 MG tablet TAKE 1 TABLET BY MOUTH 4 TIMES DAILY BEFORE MEALS AND AT BEDTIME. (Patient taking differently: Take 5 mg by mouth 4 (four) times daily - before meals and at bedtime. ) 90 tablet 0  . metoprolol succinate (TOPROL-XL) 50 MG 24 hr tablet Take 1 tablet (50 mg total) by mouth daily. 90 tablet 3  . montelukast (SINGULAIR) 10 MG tablet Take 1 tablet (10 mg total) by mouth at bedtime. (Patient taking differently: Take 10 mg by mouth at bedtime as needed (allergies). ) 30 tablet 5  . nitroGLYCERIN (NITROSTAT) 0.4 MG SL tablet Place 1 tablet (0.4 mg total) under the tongue every 5 (five) minutes as needed for chest pain. 25 tablet 2  . ondansetron (ZOFRAN ODT) 4 MG disintegrating tablet Take 1 tablet (4 mg total) by mouth every 8 (eight) hours as needed for nausea or vomiting. 20 tablet 0  . pantoprazole (PROTONIX) 40 MG tablet TAKE 1 TABLET BY MOUTH DAILY. (Patient taking differently: Take 40 mg by mouth daily. ) 30 tablet 7  . pregabalin (LYRICA) 75 MG capsule Take 1 capsule (75 mg total) by mouth daily. 30 capsule 1  . trimethoprim-polymyxin b (POLYTRIM) ophthalmic solution Place 1 drop into both eyes See admin instructions. Instill one drop into both eyes four times daily for 2 days after each monthly eye injection  12  . isosorbide mononitrate (IMDUR) 30 MG 24 hr tablet Take 0.5 tablets (15 mg total) by mouth daily. 45 tablet 3   No current facility-administered medications for this visit.   REVIEW OF SYSTEMS:  [X]  denotes positive finding, [ ]  denotes negative finding  Cardiac  Comments:  Chest pain or chest pressure:     Shortness of breath upon exertion:    Short of breath when lying flat:    Irregular heart rhythm:        Vascular    Pain in calf, thigh, or hip brought on by ambulation:    Pain in feet at night that wakes you up from your sleep:     Blood clot in  your veins:    Leg swelling:         Pulmonary    Oxygen at home:    Productive cough:     Wheezing:         Neurologic    Sudden weakness in arms or legs:     Sudden numbness in arms or legs:     Sudden onset of difficulty speaking or slurred speech:    Temporary loss of vision in one eye:     Problems with dizziness:         Gastrointestinal    Blood in stool:     Vomited blood:         Genitourinary    Burning when urinating:     Blood in urine:        Psychiatric    Major depression:         Hematologic    Bleeding problems:    Problems with blood clotting too easily:        Skin    Rashes or ulcers:        Constitutional    Fever or chills:    PHYSICAL EXAM:     Vitals:   04/17/19 1602  BP: (!) 157/67  Pulse: 82  Resp: 16  Temp: (!) 97.5 F (36.4 C)  TempSrc: Temporal  SpO2: 100%  Weight: 149 lb (67.6 kg)  Height: 5\' 5"  (1.651 m)   GENERAL: The patient is a well-nourished female, in no acute distress. The vital signs are documented above.  CARDIAC: There is a regular rate and rhythm.  VASCULAR:  Palpable femoral pulses bilaterally  PULMONARY: There is good air exchange bilaterally without wheezing or rales.  ABDOMEN: Soft and non-tender with normal pitched bowel sounds.  MUSCULOSKELETAL: There are no major deformities or cyanosis.  NEUROLOGIC: No focal weakness or paresthesias are detected.  DATA:  The left SFA stents widely patent on duplex today, likely distal tibial disease  ABI non-compressible right with monophasic waveform at ankle. ABI 0.76 left.  Assessment/Plan:  49 year old female with multiple medical issues as noted above that presents for follow-up after left SFA intervention with  stent placement for short distance lifestyle limiting claudication.  We discussed her symptoms with the right leg and given that she is having ongoing symptoms she would like to proceed with right leg intervention and she has diffuse SFA disease that looks amendable to endovascular intervention. Discussed would delay if she did not see any improvements with left leg intervention and would also delay if she feels claudication is not lifestyle limiting. She wishes to proceed.  [name], MD  [facility]  Office: [phone]

## 2019-04-27 DIAGNOSIS — E440 Moderate protein-calorie malnutrition: Secondary | ICD-10-CM | POA: Diagnosis not present

## 2019-04-27 DIAGNOSIS — Z4931 Encounter for adequacy testing for hemodialysis: Secondary | ICD-10-CM | POA: Diagnosis not present

## 2019-04-27 DIAGNOSIS — N186 End stage renal disease: Secondary | ICD-10-CM | POA: Diagnosis not present

## 2019-04-27 DIAGNOSIS — N2581 Secondary hyperparathyroidism of renal origin: Secondary | ICD-10-CM | POA: Diagnosis not present

## 2019-04-27 DIAGNOSIS — D509 Iron deficiency anemia, unspecified: Secondary | ICD-10-CM | POA: Diagnosis not present

## 2019-04-27 DIAGNOSIS — Z992 Dependence on renal dialysis: Secondary | ICD-10-CM | POA: Diagnosis not present

## 2019-04-30 DIAGNOSIS — Z992 Dependence on renal dialysis: Secondary | ICD-10-CM | POA: Diagnosis not present

## 2019-04-30 DIAGNOSIS — D509 Iron deficiency anemia, unspecified: Secondary | ICD-10-CM | POA: Diagnosis not present

## 2019-04-30 DIAGNOSIS — N186 End stage renal disease: Secondary | ICD-10-CM | POA: Diagnosis not present

## 2019-04-30 DIAGNOSIS — N2581 Secondary hyperparathyroidism of renal origin: Secondary | ICD-10-CM | POA: Diagnosis not present

## 2019-04-30 DIAGNOSIS — E440 Moderate protein-calorie malnutrition: Secondary | ICD-10-CM | POA: Diagnosis not present

## 2019-04-30 DIAGNOSIS — Z4931 Encounter for adequacy testing for hemodialysis: Secondary | ICD-10-CM | POA: Diagnosis not present

## 2019-05-01 DIAGNOSIS — D509 Iron deficiency anemia, unspecified: Secondary | ICD-10-CM | POA: Diagnosis not present

## 2019-05-01 DIAGNOSIS — N2581 Secondary hyperparathyroidism of renal origin: Secondary | ICD-10-CM | POA: Diagnosis not present

## 2019-05-01 DIAGNOSIS — Z4931 Encounter for adequacy testing for hemodialysis: Secondary | ICD-10-CM | POA: Diagnosis not present

## 2019-05-01 DIAGNOSIS — E440 Moderate protein-calorie malnutrition: Secondary | ICD-10-CM | POA: Diagnosis not present

## 2019-05-01 DIAGNOSIS — N186 End stage renal disease: Secondary | ICD-10-CM | POA: Diagnosis not present

## 2019-05-01 DIAGNOSIS — Z992 Dependence on renal dialysis: Secondary | ICD-10-CM | POA: Diagnosis not present

## 2019-05-02 DIAGNOSIS — I2510 Atherosclerotic heart disease of native coronary artery without angina pectoris: Secondary | ICD-10-CM | POA: Diagnosis not present

## 2019-05-02 DIAGNOSIS — N183 Chronic kidney disease, stage 3 unspecified: Secondary | ICD-10-CM | POA: Diagnosis not present

## 2019-05-02 DIAGNOSIS — N186 End stage renal disease: Secondary | ICD-10-CM | POA: Diagnosis not present

## 2019-05-02 DIAGNOSIS — Z794 Long term (current) use of insulin: Secondary | ICD-10-CM | POA: Diagnosis not present

## 2019-05-02 DIAGNOSIS — E1165 Type 2 diabetes mellitus with hyperglycemia: Secondary | ICD-10-CM | POA: Diagnosis not present

## 2019-05-02 DIAGNOSIS — K3184 Gastroparesis: Secondary | ICD-10-CM | POA: Diagnosis not present

## 2019-05-02 DIAGNOSIS — E1122 Type 2 diabetes mellitus with diabetic chronic kidney disease: Secondary | ICD-10-CM | POA: Diagnosis not present

## 2019-05-02 DIAGNOSIS — E1143 Type 2 diabetes mellitus with diabetic autonomic (poly)neuropathy: Secondary | ICD-10-CM | POA: Diagnosis not present

## 2019-05-03 DIAGNOSIS — Z992 Dependence on renal dialysis: Secondary | ICD-10-CM | POA: Diagnosis not present

## 2019-05-03 DIAGNOSIS — D509 Iron deficiency anemia, unspecified: Secondary | ICD-10-CM | POA: Diagnosis not present

## 2019-05-03 DIAGNOSIS — N2581 Secondary hyperparathyroidism of renal origin: Secondary | ICD-10-CM | POA: Diagnosis not present

## 2019-05-03 DIAGNOSIS — Z4931 Encounter for adequacy testing for hemodialysis: Secondary | ICD-10-CM | POA: Diagnosis not present

## 2019-05-03 DIAGNOSIS — N186 End stage renal disease: Secondary | ICD-10-CM | POA: Diagnosis not present

## 2019-05-03 DIAGNOSIS — E440 Moderate protein-calorie malnutrition: Secondary | ICD-10-CM | POA: Diagnosis not present

## 2019-05-04 DIAGNOSIS — E440 Moderate protein-calorie malnutrition: Secondary | ICD-10-CM | POA: Diagnosis not present

## 2019-05-04 DIAGNOSIS — N186 End stage renal disease: Secondary | ICD-10-CM | POA: Diagnosis not present

## 2019-05-04 DIAGNOSIS — Z992 Dependence on renal dialysis: Secondary | ICD-10-CM | POA: Diagnosis not present

## 2019-05-04 DIAGNOSIS — Z4931 Encounter for adequacy testing for hemodialysis: Secondary | ICD-10-CM | POA: Diagnosis not present

## 2019-05-04 DIAGNOSIS — N2581 Secondary hyperparathyroidism of renal origin: Secondary | ICD-10-CM | POA: Diagnosis not present

## 2019-05-04 DIAGNOSIS — D509 Iron deficiency anemia, unspecified: Secondary | ICD-10-CM | POA: Diagnosis not present

## 2019-05-07 DIAGNOSIS — Z992 Dependence on renal dialysis: Secondary | ICD-10-CM | POA: Diagnosis not present

## 2019-05-07 DIAGNOSIS — N2581 Secondary hyperparathyroidism of renal origin: Secondary | ICD-10-CM | POA: Diagnosis not present

## 2019-05-07 DIAGNOSIS — Z4931 Encounter for adequacy testing for hemodialysis: Secondary | ICD-10-CM | POA: Diagnosis not present

## 2019-05-07 DIAGNOSIS — D509 Iron deficiency anemia, unspecified: Secondary | ICD-10-CM | POA: Diagnosis not present

## 2019-05-07 DIAGNOSIS — N186 End stage renal disease: Secondary | ICD-10-CM | POA: Diagnosis not present

## 2019-05-07 DIAGNOSIS — E440 Moderate protein-calorie malnutrition: Secondary | ICD-10-CM | POA: Diagnosis not present

## 2019-05-08 DIAGNOSIS — N186 End stage renal disease: Secondary | ICD-10-CM | POA: Diagnosis not present

## 2019-05-08 DIAGNOSIS — Z538 Procedure and treatment not carried out for other reasons: Secondary | ICD-10-CM | POA: Diagnosis not present

## 2019-05-09 DIAGNOSIS — E440 Moderate protein-calorie malnutrition: Secondary | ICD-10-CM | POA: Diagnosis not present

## 2019-05-09 DIAGNOSIS — N186 End stage renal disease: Secondary | ICD-10-CM | POA: Diagnosis not present

## 2019-05-09 DIAGNOSIS — Z4931 Encounter for adequacy testing for hemodialysis: Secondary | ICD-10-CM | POA: Diagnosis not present

## 2019-05-09 DIAGNOSIS — D509 Iron deficiency anemia, unspecified: Secondary | ICD-10-CM | POA: Diagnosis not present

## 2019-05-09 DIAGNOSIS — Z992 Dependence on renal dialysis: Secondary | ICD-10-CM | POA: Diagnosis not present

## 2019-05-09 DIAGNOSIS — N2581 Secondary hyperparathyroidism of renal origin: Secondary | ICD-10-CM | POA: Diagnosis not present

## 2019-05-10 DIAGNOSIS — N186 End stage renal disease: Secondary | ICD-10-CM | POA: Diagnosis not present

## 2019-05-10 DIAGNOSIS — D509 Iron deficiency anemia, unspecified: Secondary | ICD-10-CM | POA: Diagnosis not present

## 2019-05-10 DIAGNOSIS — Z4931 Encounter for adequacy testing for hemodialysis: Secondary | ICD-10-CM | POA: Diagnosis not present

## 2019-05-10 DIAGNOSIS — E440 Moderate protein-calorie malnutrition: Secondary | ICD-10-CM | POA: Diagnosis not present

## 2019-05-10 DIAGNOSIS — Z992 Dependence on renal dialysis: Secondary | ICD-10-CM | POA: Diagnosis not present

## 2019-05-10 DIAGNOSIS — N2581 Secondary hyperparathyroidism of renal origin: Secondary | ICD-10-CM | POA: Diagnosis not present

## 2019-05-11 DIAGNOSIS — N186 End stage renal disease: Secondary | ICD-10-CM | POA: Diagnosis not present

## 2019-05-11 DIAGNOSIS — Z992 Dependence on renal dialysis: Secondary | ICD-10-CM | POA: Diagnosis not present

## 2019-05-11 DIAGNOSIS — N2581 Secondary hyperparathyroidism of renal origin: Secondary | ICD-10-CM | POA: Diagnosis not present

## 2019-05-11 DIAGNOSIS — E440 Moderate protein-calorie malnutrition: Secondary | ICD-10-CM | POA: Diagnosis not present

## 2019-05-11 DIAGNOSIS — Z4931 Encounter for adequacy testing for hemodialysis: Secondary | ICD-10-CM | POA: Diagnosis not present

## 2019-05-11 DIAGNOSIS — D509 Iron deficiency anemia, unspecified: Secondary | ICD-10-CM | POA: Diagnosis not present

## 2019-05-12 DIAGNOSIS — E1122 Type 2 diabetes mellitus with diabetic chronic kidney disease: Secondary | ICD-10-CM | POA: Diagnosis not present

## 2019-05-12 DIAGNOSIS — Z992 Dependence on renal dialysis: Secondary | ICD-10-CM | POA: Diagnosis not present

## 2019-05-12 DIAGNOSIS — N186 End stage renal disease: Secondary | ICD-10-CM | POA: Diagnosis not present

## 2019-05-14 DIAGNOSIS — Z992 Dependence on renal dialysis: Secondary | ICD-10-CM | POA: Diagnosis not present

## 2019-05-14 DIAGNOSIS — D631 Anemia in chronic kidney disease: Secondary | ICD-10-CM | POA: Diagnosis not present

## 2019-05-14 DIAGNOSIS — I10 Essential (primary) hypertension: Secondary | ICD-10-CM | POA: Diagnosis not present

## 2019-05-14 DIAGNOSIS — N186 End stage renal disease: Secondary | ICD-10-CM | POA: Diagnosis not present

## 2019-05-14 DIAGNOSIS — D509 Iron deficiency anemia, unspecified: Secondary | ICD-10-CM | POA: Diagnosis not present

## 2019-05-14 DIAGNOSIS — N2581 Secondary hyperparathyroidism of renal origin: Secondary | ICD-10-CM | POA: Diagnosis not present

## 2019-05-14 DIAGNOSIS — E440 Moderate protein-calorie malnutrition: Secondary | ICD-10-CM | POA: Diagnosis not present

## 2019-05-14 DIAGNOSIS — D649 Anemia, unspecified: Secondary | ICD-10-CM | POA: Diagnosis not present

## 2019-05-14 DIAGNOSIS — E1129 Type 2 diabetes mellitus with other diabetic kidney complication: Secondary | ICD-10-CM | POA: Diagnosis not present

## 2019-05-15 DIAGNOSIS — D649 Anemia, unspecified: Secondary | ICD-10-CM | POA: Diagnosis not present

## 2019-05-15 DIAGNOSIS — D631 Anemia in chronic kidney disease: Secondary | ICD-10-CM | POA: Diagnosis not present

## 2019-05-15 DIAGNOSIS — N186 End stage renal disease: Secondary | ICD-10-CM | POA: Diagnosis not present

## 2019-05-15 DIAGNOSIS — Z992 Dependence on renal dialysis: Secondary | ICD-10-CM | POA: Diagnosis not present

## 2019-05-15 DIAGNOSIS — E1129 Type 2 diabetes mellitus with other diabetic kidney complication: Secondary | ICD-10-CM | POA: Diagnosis not present

## 2019-05-15 DIAGNOSIS — D509 Iron deficiency anemia, unspecified: Secondary | ICD-10-CM | POA: Diagnosis not present

## 2019-05-16 DIAGNOSIS — D631 Anemia in chronic kidney disease: Secondary | ICD-10-CM | POA: Diagnosis not present

## 2019-05-16 DIAGNOSIS — E1129 Type 2 diabetes mellitus with other diabetic kidney complication: Secondary | ICD-10-CM | POA: Diagnosis not present

## 2019-05-16 DIAGNOSIS — N186 End stage renal disease: Secondary | ICD-10-CM | POA: Diagnosis not present

## 2019-05-16 DIAGNOSIS — Z992 Dependence on renal dialysis: Secondary | ICD-10-CM | POA: Diagnosis not present

## 2019-05-16 DIAGNOSIS — D649 Anemia, unspecified: Secondary | ICD-10-CM | POA: Diagnosis not present

## 2019-05-16 DIAGNOSIS — D509 Iron deficiency anemia, unspecified: Secondary | ICD-10-CM | POA: Diagnosis not present

## 2019-05-17 DIAGNOSIS — Z955 Presence of coronary angioplasty implant and graft: Secondary | ICD-10-CM | POA: Diagnosis not present

## 2019-05-17 DIAGNOSIS — Z87891 Personal history of nicotine dependence: Secondary | ICD-10-CM | POA: Diagnosis not present

## 2019-05-17 DIAGNOSIS — Z7902 Long term (current) use of antithrombotics/antiplatelets: Secondary | ICD-10-CM | POA: Diagnosis not present

## 2019-05-17 DIAGNOSIS — I2510 Atherosclerotic heart disease of native coronary artery without angina pectoris: Secondary | ICD-10-CM | POA: Diagnosis not present

## 2019-05-17 DIAGNOSIS — Z992 Dependence on renal dialysis: Secondary | ICD-10-CM | POA: Diagnosis not present

## 2019-05-17 DIAGNOSIS — Z79899 Other long term (current) drug therapy: Secondary | ICD-10-CM | POA: Diagnosis not present

## 2019-05-17 DIAGNOSIS — Z794 Long term (current) use of insulin: Secondary | ICD-10-CM | POA: Diagnosis not present

## 2019-05-17 DIAGNOSIS — Z7901 Long term (current) use of anticoagulants: Secondary | ICD-10-CM | POA: Diagnosis not present

## 2019-05-17 DIAGNOSIS — R112 Nausea with vomiting, unspecified: Secondary | ICD-10-CM | POA: Diagnosis not present

## 2019-05-17 DIAGNOSIS — K3184 Gastroparesis: Secondary | ICD-10-CM | POA: Diagnosis not present

## 2019-05-17 DIAGNOSIS — E1122 Type 2 diabetes mellitus with diabetic chronic kidney disease: Secondary | ICD-10-CM | POA: Diagnosis not present

## 2019-05-17 DIAGNOSIS — N186 End stage renal disease: Secondary | ICD-10-CM | POA: Diagnosis not present

## 2019-05-17 DIAGNOSIS — Z4902 Encounter for fitting and adjustment of peritoneal dialysis catheter: Secondary | ICD-10-CM | POA: Diagnosis not present

## 2019-05-18 DIAGNOSIS — Z992 Dependence on renal dialysis: Secondary | ICD-10-CM | POA: Diagnosis not present

## 2019-05-18 DIAGNOSIS — E1129 Type 2 diabetes mellitus with other diabetic kidney complication: Secondary | ICD-10-CM | POA: Diagnosis not present

## 2019-05-18 DIAGNOSIS — D631 Anemia in chronic kidney disease: Secondary | ICD-10-CM | POA: Diagnosis not present

## 2019-05-18 DIAGNOSIS — N186 End stage renal disease: Secondary | ICD-10-CM | POA: Diagnosis not present

## 2019-05-18 DIAGNOSIS — D509 Iron deficiency anemia, unspecified: Secondary | ICD-10-CM | POA: Diagnosis not present

## 2019-05-18 DIAGNOSIS — D649 Anemia, unspecified: Secondary | ICD-10-CM | POA: Diagnosis not present

## 2019-05-21 DIAGNOSIS — D649 Anemia, unspecified: Secondary | ICD-10-CM | POA: Diagnosis not present

## 2019-05-21 DIAGNOSIS — D631 Anemia in chronic kidney disease: Secondary | ICD-10-CM | POA: Diagnosis not present

## 2019-05-21 DIAGNOSIS — Z992 Dependence on renal dialysis: Secondary | ICD-10-CM | POA: Diagnosis not present

## 2019-05-21 DIAGNOSIS — D509 Iron deficiency anemia, unspecified: Secondary | ICD-10-CM | POA: Diagnosis not present

## 2019-05-21 DIAGNOSIS — E1129 Type 2 diabetes mellitus with other diabetic kidney complication: Secondary | ICD-10-CM | POA: Diagnosis not present

## 2019-05-21 DIAGNOSIS — N186 End stage renal disease: Secondary | ICD-10-CM | POA: Diagnosis not present

## 2019-05-22 DIAGNOSIS — Z992 Dependence on renal dialysis: Secondary | ICD-10-CM | POA: Diagnosis not present

## 2019-05-22 DIAGNOSIS — N186 End stage renal disease: Secondary | ICD-10-CM | POA: Diagnosis not present

## 2019-05-22 DIAGNOSIS — D509 Iron deficiency anemia, unspecified: Secondary | ICD-10-CM | POA: Diagnosis not present

## 2019-05-22 DIAGNOSIS — E1129 Type 2 diabetes mellitus with other diabetic kidney complication: Secondary | ICD-10-CM | POA: Diagnosis not present

## 2019-05-22 DIAGNOSIS — D631 Anemia in chronic kidney disease: Secondary | ICD-10-CM | POA: Diagnosis not present

## 2019-05-22 DIAGNOSIS — D649 Anemia, unspecified: Secondary | ICD-10-CM | POA: Diagnosis not present

## 2019-05-24 DIAGNOSIS — E1129 Type 2 diabetes mellitus with other diabetic kidney complication: Secondary | ICD-10-CM | POA: Diagnosis not present

## 2019-05-24 DIAGNOSIS — N186 End stage renal disease: Secondary | ICD-10-CM | POA: Diagnosis not present

## 2019-05-24 DIAGNOSIS — D509 Iron deficiency anemia, unspecified: Secondary | ICD-10-CM | POA: Diagnosis not present

## 2019-05-24 DIAGNOSIS — Z992 Dependence on renal dialysis: Secondary | ICD-10-CM | POA: Diagnosis not present

## 2019-05-24 DIAGNOSIS — D649 Anemia, unspecified: Secondary | ICD-10-CM | POA: Diagnosis not present

## 2019-05-24 DIAGNOSIS — D631 Anemia in chronic kidney disease: Secondary | ICD-10-CM | POA: Diagnosis not present

## 2019-05-25 ENCOUNTER — Other Ambulatory Visit: Payer: Self-pay | Admitting: *Deleted

## 2019-05-25 DIAGNOSIS — D631 Anemia in chronic kidney disease: Secondary | ICD-10-CM | POA: Diagnosis not present

## 2019-05-25 DIAGNOSIS — M25569 Pain in unspecified knee: Secondary | ICD-10-CM

## 2019-05-25 DIAGNOSIS — E1129 Type 2 diabetes mellitus with other diabetic kidney complication: Secondary | ICD-10-CM | POA: Diagnosis not present

## 2019-05-25 DIAGNOSIS — N186 End stage renal disease: Secondary | ICD-10-CM | POA: Diagnosis not present

## 2019-05-25 DIAGNOSIS — I739 Peripheral vascular disease, unspecified: Secondary | ICD-10-CM

## 2019-05-25 DIAGNOSIS — Z992 Dependence on renal dialysis: Secondary | ICD-10-CM | POA: Diagnosis not present

## 2019-05-25 DIAGNOSIS — D649 Anemia, unspecified: Secondary | ICD-10-CM | POA: Diagnosis not present

## 2019-05-25 DIAGNOSIS — D509 Iron deficiency anemia, unspecified: Secondary | ICD-10-CM | POA: Diagnosis not present

## 2019-05-28 ENCOUNTER — Telehealth (HOSPITAL_COMMUNITY): Payer: Self-pay

## 2019-05-28 DIAGNOSIS — D631 Anemia in chronic kidney disease: Secondary | ICD-10-CM | POA: Diagnosis not present

## 2019-05-28 DIAGNOSIS — D509 Iron deficiency anemia, unspecified: Secondary | ICD-10-CM | POA: Diagnosis not present

## 2019-05-28 DIAGNOSIS — Z992 Dependence on renal dialysis: Secondary | ICD-10-CM | POA: Diagnosis not present

## 2019-05-28 DIAGNOSIS — E875 Hyperkalemia: Secondary | ICD-10-CM | POA: Diagnosis not present

## 2019-05-28 DIAGNOSIS — N186 End stage renal disease: Secondary | ICD-10-CM | POA: Diagnosis not present

## 2019-05-28 DIAGNOSIS — N2581 Secondary hyperparathyroidism of renal origin: Secondary | ICD-10-CM | POA: Diagnosis not present

## 2019-05-28 NOTE — Telephone Encounter (Signed)

## 2019-05-29 ENCOUNTER — Ambulatory Visit (INDEPENDENT_AMBULATORY_CARE_PROVIDER_SITE_OTHER): Payer: Medicare Other | Admitting: Physician Assistant

## 2019-05-29 ENCOUNTER — Encounter: Payer: Self-pay | Admitting: Physician Assistant

## 2019-05-29 ENCOUNTER — Ambulatory Visit (HOSPITAL_COMMUNITY)
Admission: RE | Admit: 2019-05-29 | Discharge: 2019-05-29 | Disposition: A | Discharge status: Home / Self Care | Payer: Medicare Other | Source: Ambulatory Visit | Attending: Vascular Surgery | Admitting: Vascular Surgery

## 2019-05-29 ENCOUNTER — Other Ambulatory Visit: Payer: Self-pay

## 2019-05-29 ENCOUNTER — Ambulatory Visit (INDEPENDENT_AMBULATORY_CARE_PROVIDER_SITE_OTHER)
Admission: RE | Admit: 2019-05-29 | Discharge: 2019-05-29 | Disposition: A | Discharge status: Home / Self Care | Payer: Medicare Other | Source: Ambulatory Visit | Attending: Vascular Surgery | Admitting: Vascular Surgery

## 2019-05-29 VITALS — BP 177/87 | HR 87 | Temp 97.4°F | Resp 20 | Ht 65.0 in | Wt 158.0 lb

## 2019-05-29 DIAGNOSIS — I739 Peripheral vascular disease, unspecified: Secondary | ICD-10-CM | POA: Diagnosis not present

## 2019-05-29 DIAGNOSIS — M25569 Pain in unspecified knee: Secondary | ICD-10-CM

## 2019-05-29 NOTE — Progress Notes (Signed)
Office Note     CC:  follow up Requesting Provider:  Charlott Rakes, MD  HPI: Susan Horton is a 49 y.o. (July 15, 1970) female who presents for follow up of peripheral vascular disease. She has had multiple interventions for limiting claudication. Most recently on 04/25/19 she underwent  1. Ultrasound-guided access of the left common femoral artery 2.  Aortogram 3.  Right lower extremity arteriogram with selection of third order branches 4.  Right SFA angioplasty with stent placement (predilated with a 4 mm Mustang and then stented with 5 mm x 150 mm self-expanding Innova and 5 mm x 120 mm self-expanding Innova all postdilated with 4 mm Mustang) 5.  Left external iliac angioplasty with stent placement (7 mm x 40 mm self-expanding Innova postdilated with a 6 mm Mustang) 6.  Mynx closure of the left common femoral artery by Dr. Carlis Abbott for lifestyle limiting claudication.   On angiogram she was found to have high grade stenosis of proximal left external iliac artery. She also had long segment right SFA disease with nearly 90% occlusion throughout. Patent above knee popliteal artery and single vessel PTA runoff. At end of intervention she appeared to have two vessel runoff via the right PTA and ATA.  She is following up today with non invasive vascular studies. She says her right lower extremity limiting claudication is now improved. She is able to get around the store with her cane without having to use motorized scooter. She is not having any rest pain or non healing wounds  She previously had undergone Aortogram with left SFA angioplasty and Left SFA stent for limiting claudication back in December 2020 by Dr. Carlis Abbott. Since then her left lower extremity is without claudication, rest pain or non healing wounds  She is ESRD on home hemodialysis Mon/Tues/Thurs/Fri. She has a left brachiocephalic AV fistula that was placed 09/14/17 by Dr. Carlis Abbott. She does not have any issues with her fistula. She denies  any steal symptoms.  The pt is on a statin for cholesterol management.  The pt is on a daily aspirin.   Other AC: Plavix The pt is on BB for hypertension.   The pt is diabetic. On Insulin Tobacco hx:  Former smoker, 11/2016  Past Medical History:  Diagnosis Date  . Abnormal stress test   . Anemia   . Angio-edema   . Asthma   . CAD (coronary artery disease)    DES to mid LAD July 2018, residual moderate RCA disease  . Cataract   . CKD (chronic kidney disease) stage 4, GFR 15-29 ml/min (HCC)    Dialysis T/Th/Sa  . DVT (deep venous thrombosis) ([place])    1996 during pregnancy, 2015 left leg  . Eczema   . Essential hypertension 12/11/2015  . Gastroparesis   . GERD (gastroesophageal reflux disease)   . Gluteal abscess 12/27/2018  . Hidradenitis   . Migraine   . Neuropathy   . Peripheral vascular disease ([place])    blood clot in leg  . Progressive angina ([place]) 06/05/2014   Chest pain  . Type 2 diabetes mellitus ([place])   . Type II diabetes mellitus ([place])   . Urticaria     Past Surgical History:  Procedure Laterality Date  . A/V FISTULAGRAM N/A 06/22/2017   Procedure: A/V FISTULAGRAM - left arm;  Surgeon: [name], MD;  Location: [facility];  Service: Cardiovascular;  Laterality: N/A;  . ABDOMINAL AORTOGRAM W/LOWER EXTREMITY Bilateral 12/14/2018   Procedure: ABDOMINAL AORTOGRAM W/LOWER EXTREMITY;  Surgeon: [name], MD;  Location: [facility];  Service: Cardiovascular;  Laterality: Bilateral;  . ABDOMINAL AORTOGRAM W/LOWER EXTREMITY N/A 04/25/2019   Procedure: ABDOMINAL AORTOGRAM W/LOWER EXTREMITY;  Surgeon: [name], MD;  Location: [facility];  Service: Cardiovascular;  Laterality: N/A;  . ABDOMINOPLASTY    . ADENOIDECTOMY    . APPENDECTOMY  1995  . AV FISTULA PLACEMENT Left 02/01/2017   Procedure: ARTERIOVENOUS BRACHIOCEPHALIC (AV) FISTULA CREATION;  Surgeon: [name], MD;  Location: [facility];  Service: Vascular;   Laterality: Left;  . CHOLECYSTECTOMY  1993  . CORONARY ANGIOPLASTY WITH STENT PLACEMENT    . CORONARY STENT INTERVENTION N/A 08/05/2016   Procedure: Coronary Stent Intervention;  Surgeon: [name], MD;  Location: [facility];  Service: Cardiovascular;  Laterality: N/A;  . EXPLORATORY LAPAROTOMY  08/14/2005   lysis of adhesions, drainage of tubo-ovarian abscess  . FISTULA SUPERFICIALIZATION Left 09/14/2017   Procedure: FISTULA SUPERFICIALIZATION ARTERIOVENOUS FISTULA LEFT ARM;  Surgeon: [name], MD;  Location: [facility];  Service: Vascular;  Laterality: Left;  . FLEXIBLE SIGMOIDOSCOPY N/A 01/04/2019   Procedure: FLEXIBLE SIGMOIDOSCOPY;  Surgeon: Clarene Essex, MD;  Location: [facility];  Service: Endoscopy;  Laterality: N/A;  . HYDRADENITIS EXCISION  02/08/2011   Procedure: EXCISION HYDRADENITIS GROIN;  Surgeon: [name], MD;  Location: [facility];  Service: General;  Laterality: N/A;  Excisioin of Hidradenitis Left groin  . INCISION AND DRAINAGE ABSCESS N/A 01/11/2019   Procedure: INCISION AND DRAINAGE SACRAL ABSCESS;  Surgeon: Georganna Skeans, MD;  Location: [facility];  Service: General;  Laterality: N/A;  . INGUINAL HIDRADENITIS EXCISION  07/06/2010   bilateral  . INSERTION OF DIALYSIS CATHETER N/A 09/14/2017   Procedure: INSERTION OF TUNNELED DIALYSIS CATHETER Right Internal Jugular;  Surgeon: [name], MD;  Location: [facility];  Service: Vascular;  Laterality: N/A;  . IR FLUORO GUIDE CV LINE RIGHT  03/01/2019  . IR US GUIDE BX ASP/DRAIN  01/01/2019  . IR US GUIDE VASC ACCESS RIGHT  03/01/2019  . LEFT HEART CATH AND CORONARY ANGIOGRAPHY N/A 08/05/2016   Procedure: Left Heart Cath and Coronary Angiography;  Surgeon: [name], MD;  Location: [facility];  Service: Cardiovascular;  Laterality: N/A;  . PERIPHERAL VASCULAR INTERVENTION Left 12/14/2018   Procedure: PERIPHERAL VASCULAR INTERVENTION;  Surgeon: [name], MD;   Location: [facility];  Service: Cardiovascular;  Laterality: Left;  SFA  . PERIPHERAL VASCULAR INTERVENTION Left 04/25/2019   Procedure: PERIPHERAL VASCULAR INTERVENTION;  Surgeon: [name], MD;  Location: [facility];  Service: Cardiovascular;  Laterality: Left;  right superficial femoral, and left iliac  . REDUCTION MAMMAPLASTY  2002  . TONSILLECTOMY    . TUBAL LIGATION  1996  . VAGINAL HYSTERECTOMY  08/04/2005   and cysto    Social History   Socioeconomic History  . Marital status: Single    Spouse name: Not on file  . Number of children: 3  . Years of education: 67  . Highest education level: Not on file  Occupational History  . Occupation: Curator  Tobacco Use  . Smoking status: Former Smoker    Years: 20.00    Types: Cigarettes    Quit date: 11/2016    Years since quitting: 2.5  . Smokeless tobacco: Never Used  Substance and Sexual Activity  . Alcohol use: No    Alcohol/week: 0.0 standard drinks  . Drug use:  No  . Sexual activity: Not on file  Other Topics Concern  . Not on file  Social History Narrative   Lives with daughter in a 2 story home.  Has 3 children.  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  Education: college.    Social Determinants of Health   Financial Resource Strain:   . Difficulty of Paying Living Expenses:   Food Insecurity:   . Worried About Charity fundraiser in the Last Year:   . Arboriculturist in the Last Year:   Transportation Needs:   . Film/video editor (Medical):   [name] Lack of Transportation (Non-Medical):   Physical Activity:   . Days of Exercise per Week:   . Minutes of Exercise per Session:   Stress:   . Feeling of Stress :   Social Connections:   . Frequency of Communication with Friends and Family:   . Frequency of Social Gatherings with Friends and Family:   . Attends Religious Services:   . Active Member of Clubs or Organizations:   . Attends Theatre manager Meetings:   [name] Marital Status:   Intimate Partner Violence:   . Fear of Current or Ex-Partner:   . Emotionally Abused:   [name] Physically Abused:   . Sexually Abused:     Family History  Problem Relation Age of Onset  . Allergic rhinitis Mother   . Hypertension Father   . Allergic rhinitis Father   . Hypertension Sister   . Allergic rhinitis Sister   . Hypertension Brother   . Allergic rhinitis Brother   . Breast cancer Cousin        x2    Current Outpatient Medications  Medication Sig Dispense Refill  . albuterol (PROVENTIL HFA;VENTOLIN HFA) 108 (90 BASE) MCG/ACT inhaler Inhale 1-2 puffs into the lungs every 6 (six) hours as needed for wheezing or shortness of breath.     . Alirocumab (PRALUENT) 75 MG/ML SOAJ Inject 75 mg into the skin every 14 (fourteen) days. 2 pen 11  . amLODipine (NORVASC) 10 MG tablet Take by mouth.    [name] aspirin 81 MG chewable tablet Chew 1 tablet (81 mg total) by mouth daily. 30 tablet 0  . B Complex-C-Zn-Folic Acid (DIALYVITE 720 WITH ZINC) 0.8 MG TABS Take 1 tablet by mouth daily.    . calcitRIOL (ROCALTROL) 0.5 MCG capsule Take 1.5 mcg by mouth daily.    . calcium acetate (PHOSLO) 667 MG capsule Take 1,334-2,001 mg by mouth See admin instructions. Take 2001 mg by mouth up to three times daily with meals and 1334 mg with snacks  10  . clopidogrel (PLAVIX) 75 MG tablet Take 1 tablet (75 mg total) by mouth daily with breakfast. 90 tablet 3  . Fluticasone-Salmeterol (ADVAIR) 250-50 MCG/DOSE AEPB Inhale 1 puff into the lungs 2 (two) times daily. 60 each 3  . gentamicin cream (GARAMYCIN) 0.1 % Apply 1 application topically See admin instructions. Apply to access site nightly    . insulin glargine, 2 Unit Dial, (TOUJEO MAX SOLOSTAR) 300 UNIT/ML Solostar Pen Inject 54 Units into the skin at bedtime.     . insulin regular (NOVOLIN R) 100 units/mL injection Inject 0-10 Units into the skin 3 (three) times daily before meals.    . isosorbide mononitrate  (IMDUR) 30 MG 24 hr tablet Take 0.5 tablets (15 mg total) by mouth daily. 45 tablet 3  . methocarbamol (ROBAXIN) 500 MG tablet Take 1 tablet (500 mg  total) by mouth 2 (two) times daily as needed for muscle spasms. 60 tablet 1  . metoCLOPramide (REGLAN) 5 MG tablet TAKE 1 TABLET BY MOUTH 4 TIMES DAILY BEFORE MEALS AND AT BEDTIME. (Patient taking differently: Take 5 mg by mouth 4 (four) times daily -  before meals and at bedtime. ) 90 tablet 0  . metoprolol succinate (TOPROL-XL) 50 MG 24 hr tablet Take 1 tablet (50 mg total) by mouth daily. 90 tablet 3  . montelukast (SINGULAIR) 10 MG tablet Take 1 tablet (10 mg total) by mouth at bedtime. 30 tablet 5  . nitroGLYCERIN (NITROSTAT) 0.4 MG SL tablet Place 1 tablet (0.4 mg total) under the tongue every 5 (five) minutes as needed for chest pain. 25 tablet 2  . ondansetron (ZOFRAN ODT) 4 MG disintegrating tablet Take 1 tablet (4 mg total) by mouth every 8 (eight) hours as needed for nausea or vomiting. 20 tablet 0  . pantoprazole (PROTONIX) 40 MG tablet TAKE 1 TABLET BY MOUTH DAILY. (Patient taking differently: Take 40 mg by mouth daily. ) 30 tablet 7  . pregabalin (LYRICA) 75 MG capsule Take 1 capsule (75 mg total) by mouth daily. 30 capsule 1  . trimethoprim-polymyxin b (POLYTRIM) ophthalmic solution Place 1 drop into both eyes See admin instructions. Instill one drop into both eyes four times daily for 2 days after each monthly eye injection  12  . amitriptyline (ELAVIL) 10 MG tablet Take 1 tablet (10 mg total) by mouth at bedtime. (Patient not taking: Reported on 04/24/2019) 30 tablet 1  . insulin aspart (NOVOLOG) 100 UNIT/ML injection 0-6 Units, Subcutaneous, 3 times daily with meals CBG < 70: Implement Hypoglycemia measures/call MD CBG 70 - 120: 0 units CBG 121 - 150: 0 units CBG 151 - 200: 1 unit CBG 201-250: 2 units CBG 251-300: 3 units CBG 301-350: 4 units CBG 351-400: 5 units CBG > 400: Give 6 units and call MD (Patient not taking: Reported on 05/29/2019)  10 mL 0   No current facility-administered medications for this visit.    Allergies  Allergen Reactions  . Repatha [Evolocumab] Itching  . Lisinopril Cough     REVIEW OF SYSTEMS:  Review of Systems  Constitutional: Negative for chills, fever and malaise/fatigue.  Eyes: Negative for double vision.  Respiratory: Negative for cough and shortness of breath.   Cardiovascular: Positive for leg swelling. Negative for chest pain and palpitations.  Gastrointestinal: Negative for abdominal pain, constipation, diarrhea, nausea and vomiting.  Genitourinary: Negative for dysuria.  Musculoskeletal: Negative for myalgias.  Neurological: Negative for dizziness, weakness and headaches.  Endo/Heme/Allergies: Does not bruise/bleed easily.    PHYSICAL EXAMINATION:  Vitals:   05/29/19 1430  BP: (!) 177/87  Pulse: 87  Resp: 20  Temp: (!) 97.4 F (36.3 C)  SpO2: 100%  Weight: 158 lb (71.7 kg)  Height: 5\' 5"  (1.651 m)    General:  WDWN in NAD; vital signs documented above Gait: Normal, ambulates with cane HENT: WNL, normocephalic Pulmonary: normal non-labored breathing , without wheezing Cardiac: regular HR, without  Murmurs without carotid bruit Abdomen: soft, NT, no masses Skin: without rashes Vascular Exam/Pulses:  Right Left  Radial 2+ (normal) 2+ (normal)  Ulnar 2+ (normal) 2+ (normal)  Femoral 2+ (normal) 2+ (normal)  Popliteal Not palpable Not palpable  DP 1+ (weak) 2+ (normal)  PT 2 + (normal) Not palpable   Extremities: without ischemic changes, without Gangrene , without cellulitis; without open wounds;  Musculoskeletal: no muscle wasting or atrophy  Neurologic:  A&O X 3;  No focal weakness or paresthesias are detected Psychiatric:  The pt has Normal affect.   Non-Invasive Vascular Imaging:   +-------+-----------+-----------+------------+------------+  ABI/TBIToday's ABIToday's TBIPrevious ABIPrevious TBI    +-------+-----------+-----------+------------+------------+  Right [place]     0.40    [place]     0.21      +-------+-----------+-----------+------------+------------+  Left  0.91    0.45    0.76    0.15      +-------+-----------+-----------+------------+------------+   Arterial duplex of bilateral lower extremities shows patent right SFA stent with 50-99% stenosis just distal to the stent with biphasic flow and velocity of 288 . Patent left SFA stent with biphasic flow throughout.   ASSESSMENT/PLAN:: 49 y.o. female here for follow up of peripheral vascular disease. She has had bilateral lower extremity interventions for limiting claudication. She most recently underwent 1. Ultrasound-guided access of the left common femoral artery 2.  Aortogram 3.  Right lower extremity arteriogram with selection of third order branches 4.  Right SFA angioplasty with stent placement (predilated with a 4 mm Mustang and then stented with 5 mm x 150 mm self-expanding Innova and 5 mm x 120 mm self-expanding Innova all postdilated with 4 mm Mustang) 5.  Left external iliac angioplasty with stent placement (7 mm x 40 mm self-expanding Innova postdilated with a 6 mm Mustang) 6.  Mynx closure of the left common femoral artery on 04/25/19 by Dr. Carlis Abbott Non invasive studies today show improved right TBI. ABI remains Non compressible with biphasic PTA and monophasic DP waveforms. Left ABI/TBI both increased from last study with monophasic waveforms Arterial duplex showing bilateral lower extremity SFA stents with patency however there is 50-99% stenosis just distal to the recently placed SFA stent. - Symptoms are presently improved so at this time do not recommend any further intervention - Encourage exercise therapy - She will continue her Aspirin, plavix and statin - Her Left BC AV fistula continues to function well. She will continue her at home HD - Her carotid duplex will be done  at Dr. Kennon Holter office In July  - I discussed with her that if her symptoms worsen or if she develops rest pain or non healing wounds she should call for earlier follow up - She will follow up in 6-8 weeks with repeat ABIs and aortoiliac duplex to evaluate recently placed iliac stent   Karoline Caldwell, PA-C [facility] [phone]  Clinic MD: Dr. Carlis Abbott

## 2019-05-30 ENCOUNTER — Other Ambulatory Visit: Payer: Self-pay | Admitting: *Deleted

## 2019-05-30 DIAGNOSIS — E875 Hyperkalemia: Secondary | ICD-10-CM | POA: Diagnosis not present

## 2019-05-30 DIAGNOSIS — N186 End stage renal disease: Secondary | ICD-10-CM | POA: Diagnosis not present

## 2019-05-30 DIAGNOSIS — I739 Peripheral vascular disease, unspecified: Secondary | ICD-10-CM

## 2019-05-30 DIAGNOSIS — N2581 Secondary hyperparathyroidism of renal origin: Secondary | ICD-10-CM | POA: Diagnosis not present

## 2019-05-30 DIAGNOSIS — D509 Iron deficiency anemia, unspecified: Secondary | ICD-10-CM | POA: Diagnosis not present

## 2019-05-30 DIAGNOSIS — D631 Anemia in chronic kidney disease: Secondary | ICD-10-CM | POA: Diagnosis not present

## 2019-05-30 DIAGNOSIS — Z992 Dependence on renal dialysis: Secondary | ICD-10-CM | POA: Diagnosis not present

## 2019-05-31 DIAGNOSIS — D631 Anemia in chronic kidney disease: Secondary | ICD-10-CM | POA: Diagnosis not present

## 2019-05-31 DIAGNOSIS — N2581 Secondary hyperparathyroidism of renal origin: Secondary | ICD-10-CM | POA: Diagnosis not present

## 2019-05-31 DIAGNOSIS — Z992 Dependence on renal dialysis: Secondary | ICD-10-CM | POA: Diagnosis not present

## 2019-05-31 DIAGNOSIS — E875 Hyperkalemia: Secondary | ICD-10-CM | POA: Diagnosis not present

## 2019-05-31 DIAGNOSIS — N186 End stage renal disease: Secondary | ICD-10-CM | POA: Diagnosis not present

## 2019-05-31 DIAGNOSIS — D509 Iron deficiency anemia, unspecified: Secondary | ICD-10-CM | POA: Diagnosis not present

## 2019-06-01 DIAGNOSIS — N186 End stage renal disease: Secondary | ICD-10-CM | POA: Diagnosis not present

## 2019-06-01 DIAGNOSIS — N2581 Secondary hyperparathyroidism of renal origin: Secondary | ICD-10-CM | POA: Diagnosis not present

## 2019-06-01 DIAGNOSIS — Z992 Dependence on renal dialysis: Secondary | ICD-10-CM | POA: Diagnosis not present

## 2019-06-01 DIAGNOSIS — D631 Anemia in chronic kidney disease: Secondary | ICD-10-CM | POA: Diagnosis not present

## 2019-06-01 DIAGNOSIS — D509 Iron deficiency anemia, unspecified: Secondary | ICD-10-CM | POA: Diagnosis not present

## 2019-06-01 DIAGNOSIS — E875 Hyperkalemia: Secondary | ICD-10-CM | POA: Diagnosis not present

## 2019-06-04 DIAGNOSIS — D509 Iron deficiency anemia, unspecified: Secondary | ICD-10-CM | POA: Diagnosis not present

## 2019-06-04 DIAGNOSIS — D631 Anemia in chronic kidney disease: Secondary | ICD-10-CM | POA: Diagnosis not present

## 2019-06-04 DIAGNOSIS — N2581 Secondary hyperparathyroidism of renal origin: Secondary | ICD-10-CM | POA: Diagnosis not present

## 2019-06-04 DIAGNOSIS — Z992 Dependence on renal dialysis: Secondary | ICD-10-CM | POA: Diagnosis not present

## 2019-06-04 DIAGNOSIS — E875 Hyperkalemia: Secondary | ICD-10-CM | POA: Diagnosis not present

## 2019-06-04 DIAGNOSIS — N186 End stage renal disease: Secondary | ICD-10-CM | POA: Diagnosis not present

## 2019-06-05 DIAGNOSIS — Z992 Dependence on renal dialysis: Secondary | ICD-10-CM | POA: Diagnosis not present

## 2019-06-05 DIAGNOSIS — N2581 Secondary hyperparathyroidism of renal origin: Secondary | ICD-10-CM | POA: Diagnosis not present

## 2019-06-05 DIAGNOSIS — N186 End stage renal disease: Secondary | ICD-10-CM | POA: Diagnosis not present

## 2019-06-05 DIAGNOSIS — D509 Iron deficiency anemia, unspecified: Secondary | ICD-10-CM | POA: Diagnosis not present

## 2019-06-05 DIAGNOSIS — E875 Hyperkalemia: Secondary | ICD-10-CM | POA: Diagnosis not present

## 2019-06-05 DIAGNOSIS — D631 Anemia in chronic kidney disease: Secondary | ICD-10-CM | POA: Diagnosis not present

## 2019-06-07 DIAGNOSIS — D509 Iron deficiency anemia, unspecified: Secondary | ICD-10-CM | POA: Diagnosis not present

## 2019-06-07 DIAGNOSIS — E875 Hyperkalemia: Secondary | ICD-10-CM | POA: Diagnosis not present

## 2019-06-07 DIAGNOSIS — N186 End stage renal disease: Secondary | ICD-10-CM | POA: Diagnosis not present

## 2019-06-07 DIAGNOSIS — Z992 Dependence on renal dialysis: Secondary | ICD-10-CM | POA: Diagnosis not present

## 2019-06-07 DIAGNOSIS — N2581 Secondary hyperparathyroidism of renal origin: Secondary | ICD-10-CM | POA: Diagnosis not present

## 2019-06-07 DIAGNOSIS — D631 Anemia in chronic kidney disease: Secondary | ICD-10-CM | POA: Diagnosis not present

## 2019-06-08 ENCOUNTER — Emergency Department (HOSPITAL_COMMUNITY)
Admission: EM | Admit: 2019-06-08 | Discharge: 2019-06-08 | Disposition: A | Discharge status: Home / Self Care | Payer: Medicare Other | Attending: Emergency Medicine | Admitting: Emergency Medicine

## 2019-06-08 ENCOUNTER — Other Ambulatory Visit: Payer: Self-pay

## 2019-06-08 ENCOUNTER — Encounter (HOSPITAL_COMMUNITY): Payer: Self-pay | Admitting: Emergency Medicine

## 2019-06-08 ENCOUNTER — Emergency Department (HOSPITAL_COMMUNITY): Payer: Medicare Other

## 2019-06-08 DIAGNOSIS — D631 Anemia in chronic kidney disease: Secondary | ICD-10-CM | POA: Diagnosis not present

## 2019-06-08 DIAGNOSIS — N2581 Secondary hyperparathyroidism of renal origin: Secondary | ICD-10-CM | POA: Diagnosis not present

## 2019-06-08 DIAGNOSIS — Z794 Long term (current) use of insulin: Secondary | ICD-10-CM | POA: Diagnosis not present

## 2019-06-08 DIAGNOSIS — Z955 Presence of coronary angioplasty implant and graft: Secondary | ICD-10-CM | POA: Diagnosis not present

## 2019-06-08 DIAGNOSIS — E875 Hyperkalemia: Secondary | ICD-10-CM | POA: Diagnosis not present

## 2019-06-08 DIAGNOSIS — N186 End stage renal disease: Secondary | ICD-10-CM | POA: Insufficient documentation

## 2019-06-08 DIAGNOSIS — R079 Chest pain, unspecified: Secondary | ICD-10-CM | POA: Diagnosis not present

## 2019-06-08 DIAGNOSIS — I2510 Atherosclerotic heart disease of native coronary artery without angina pectoris: Secondary | ICD-10-CM | POA: Insufficient documentation

## 2019-06-08 DIAGNOSIS — Z79899 Other long term (current) drug therapy: Secondary | ICD-10-CM | POA: Diagnosis not present

## 2019-06-08 DIAGNOSIS — D509 Iron deficiency anemia, unspecified: Secondary | ICD-10-CM | POA: Diagnosis not present

## 2019-06-08 DIAGNOSIS — E1122 Type 2 diabetes mellitus with diabetic chronic kidney disease: Secondary | ICD-10-CM | POA: Diagnosis not present

## 2019-06-08 DIAGNOSIS — I5032 Chronic diastolic (congestive) heart failure: Secondary | ICD-10-CM | POA: Diagnosis not present

## 2019-06-08 DIAGNOSIS — I132 Hypertensive heart and chronic kidney disease with heart failure and with stage 5 chronic kidney disease, or end stage renal disease: Secondary | ICD-10-CM | POA: Insufficient documentation

## 2019-06-08 DIAGNOSIS — I309 Acute pericarditis, unspecified: Secondary | ICD-10-CM

## 2019-06-08 DIAGNOSIS — E1140 Type 2 diabetes mellitus with diabetic neuropathy, unspecified: Secondary | ICD-10-CM | POA: Insufficient documentation

## 2019-06-08 DIAGNOSIS — Z992 Dependence on renal dialysis: Secondary | ICD-10-CM | POA: Diagnosis not present

## 2019-06-08 DIAGNOSIS — M25511 Pain in right shoulder: Secondary | ICD-10-CM | POA: Insufficient documentation

## 2019-06-08 DIAGNOSIS — R0602 Shortness of breath: Secondary | ICD-10-CM | POA: Insufficient documentation

## 2019-06-08 DIAGNOSIS — Z87891 Personal history of nicotine dependence: Secondary | ICD-10-CM | POA: Insufficient documentation

## 2019-06-08 DIAGNOSIS — M25512 Pain in left shoulder: Secondary | ICD-10-CM | POA: Insufficient documentation

## 2019-06-08 DIAGNOSIS — I313 Pericardial effusion (noninflammatory): Secondary | ICD-10-CM | POA: Diagnosis not present

## 2019-06-08 LAB — BASIC METABOLIC PANEL
Anion gap: 15 (ref 5–15)
BUN: 56 mg/dL — ABNORMAL HIGH (ref 6–20)
CO2: 28 mmol/L (ref 22–32)
Calcium: 8.9 mg/dL (ref 8.9–10.3)
Chloride: 93 mmol/L — ABNORMAL LOW (ref 98–111)
Creatinine, Ser: 7.85 mg/dL — ABNORMAL HIGH (ref 0.44–1.00)
GFR calc Af Amer: 6 mL/min — ABNORMAL LOW (ref >60)
GFR calc non Af Amer: 6 mL/min — ABNORMAL LOW (ref >60)
Glucose, Bld: 121 mg/dL — ABNORMAL HIGH (ref 70–99)
Potassium: 3.3 mmol/L — ABNORMAL LOW (ref 3.5–5.1)
Sodium: 136 mmol/L (ref 135–145)

## 2019-06-08 LAB — CBC
HCT: 27.7 % — ABNORMAL LOW (ref 36.0–46.0)
Hemoglobin: 8.2 g/dL — ABNORMAL LOW (ref 12.0–15.0)
MCH: 24.2 pg — ABNORMAL LOW (ref 26.0–34.0)
MCHC: 29.6 g/dL — ABNORMAL LOW (ref 30.0–36.0)
MCV: 81.7 fL (ref 80.0–100.0)
Platelets: 376 10*3/uL (ref 150–400)
RBC: 3.39 MIL/uL — ABNORMAL LOW (ref 3.87–5.11)
RDW: 16.1 % — ABNORMAL HIGH (ref 11.5–15.5)
WBC: 10 10*3/uL (ref 4.0–10.5)
nRBC: 0 % (ref 0.0–0.2)

## 2019-06-08 LAB — TROPONIN I (HIGH SENSITIVITY)
Troponin I (High Sensitivity): 4 ng/L (ref <18)
Troponin I (High Sensitivity): 4 ng/L (ref <18)

## 2019-06-08 LAB — I-STAT BETA HCG BLOOD, ED (MC, WL, AP ONLY): I-stat hCG, quantitative: <5 m[IU]/mL (ref <5)

## 2019-06-08 MED ORDER — OXYCODONE HCL 5 MG PO TABS
5.0000 mg | ORAL_TABLET | Freq: Once | ORAL | Status: AC
  Administered 2019-06-08: 5 mg via ORAL
  Filled 2019-06-08: qty 1

## 2019-06-08 MED ORDER — IBUPROFEN 600 MG PO TABS
600.0000 mg | ORAL_TABLET | Freq: Three times a day (TID) | ORAL | 0 refills | Status: AC
Start: 2019-06-08 — End: 2019-07-08

## 2019-06-08 MED ORDER — KETOROLAC TROMETHAMINE 60 MG/2ML IM SOLN
15.0000 mg | Freq: Once | INTRAMUSCULAR | Status: AC
  Administered 2019-06-08: 15 mg via INTRAMUSCULAR
  Filled 2019-06-08: qty 2

## 2019-06-08 MED ORDER — SODIUM CHLORIDE 0.9% FLUSH: 3.0000 mL | Freq: Once | INTRAVENOUS | Status: DC

## 2019-06-08 MED ORDER — COLCHICINE 0.6 MG PO TABS
0.6000 mg | ORAL_TABLET | Freq: Every day | ORAL | 0 refills | Status: DC
Start: 2019-06-08 — End: 2019-10-10

## 2019-06-08 NOTE — Discharge Instructions (Signed)
The nephrologist thought it was fine for you to take ibuprofen as well as colchicine.  Please return to the emergency department for feeling lightheaded or like you may pass out.  Please call your cardiologist on Monday though they should be trying to set you up a formal ultrasound to better visualize this.  Please take the ibuprofen with food.  Please stop taking it immediately if you notice that your stool is dark or bloody.

## 2019-06-08 NOTE — ED Provider Notes (Signed)
[facility] EMERGENCY DEPARTMENT Provider Note   CSN: [id number] Arrival date & time: 06/08/19  1451     History Chief Complaint  Patient presents with  . Shoulder Pain  . Shortness of Breath  . Chest Pain    Susan Horton is a 49 y.o. female.  49 yo F with a chief complaints of chest pain radiates to her shoulders bilaterally shortness of breath.  This is pleuritic.  Worse with taking a deep breath.  No coughing.  Worse with lying flat and improves with sitting up.  Denies trauma to the chest.  Denies fevers.  Denies abdominal pain denies nausea vomiting or diarrhea.  The patient has had multiple procedures recently.  Switched from peritoneal to hemodialysis.  She been running well under her dry weight.  Has been compliant with her dialysis.  She does it at home.  Had her peritoneal dialysis catheter removed recently.  The history is provided by the patient.  Shoulder Pain Associated symptoms: no fever   Shortness of Breath Associated symptoms: chest pain   Associated symptoms: no fever, no headaches, no vomiting and no wheezing   Chest Pain Associated symptoms: shortness of breath   Associated symptoms: no dizziness, no fever, no headache, no nausea, no palpitations and no vomiting   Illness Severity:  Moderate Onset quality:  Gradual Duration:  2 weeks Timing:  Constant Progression:  Worsening Chronicity:  New Associated symptoms: chest pain and shortness of breath   Associated symptoms: no congestion, no fever, no headaches, no myalgias, no nausea, no rhinorrhea, no vomiting and no wheezing        Past Medical History:  Diagnosis Date  . Abnormal stress test   . Anemia   . Angio-edema   . Asthma   . CAD (coronary artery disease)    DES to mid LAD July 2018, residual moderate RCA disease  . Cataract   . CKD (chronic kidney disease) stage 4, GFR 15-29 ml/min (HCC)    Dialysis T/Th/Sa  . DVT (deep venous thrombosis) ([place])    1996 during  pregnancy, 2015 left leg  . Eczema   . Essential hypertension 12/11/2015  . Gastroparesis   . GERD (gastroesophageal reflux disease)   . Gluteal abscess 12/27/2018  . Hidradenitis   . Migraine   . Neuropathy   . Peripheral vascular disease ([place])    blood clot in leg  . Progressive angina ([place]) 06/05/2014   Chest pain  . Type 2 diabetes mellitus ([place])   . Type II diabetes mellitus ([place])   . Urticaria     Patient Active Problem List   Diagnosis Date Noted  . Intractable nausea and vomiting 02/24/2019  . Epigastric abdominal pain 02/24/2019  . Multifocal pneumonia 02/24/2019  . Shortness of breath 01/19/2019  . Cerebrovascular small vessel disease   . Generalized weakness   . Abscess of sacrum ([place])   . Gluteal abscess 12/25/2018  . Symptomatic anemia 09/16/2018  . Peripheral vascular disease, unspecified ([place]) 09/04/2018  . ESRD on peritoneal dialysis ([place]) 10/11/2017  . Chronic diastolic CHF (congestive heart failure) ([place]) 06/18/2017  . Moderate persistent asthma 06/10/2017  . CKD (chronic kidney disease) stage 4, GFR 15-29 ml/min (HCC)   . CAD S/P mLAD PCI with DES 08/06/2016  . Presence of drug coated stent in LAD coronary artery 08/06/2016  . Carotid bruit present 06/15/2016  . Dyslipidemia 06/15/2016  . Smoker 06/15/2016  . Neuropathy 03/10/2016  . Essential hypertension, benign 12/11/2015  . Gastroparesis   .  Uncontrolled type 2 diabetes mellitus with peripheral neuropathy ([place]) 06/05/2014    Past Surgical History:  Procedure Laterality Date  . A/V FISTULAGRAM N/A 06/22/2017   Procedure: A/V FISTULAGRAM - left arm;  Surgeon: [name], MD;  Location: [facility];  Service: Cardiovascular;  Laterality: N/A;  . ABDOMINAL AORTOGRAM W/LOWER EXTREMITY Bilateral 12/14/2018   Procedure: ABDOMINAL AORTOGRAM W/LOWER EXTREMITY;  Surgeon: [name], MD;  Location: [facility];  Service: Cardiovascular;  Laterality: Bilateral;  .  ABDOMINAL AORTOGRAM W/LOWER EXTREMITY N/A 04/25/2019   Procedure: ABDOMINAL AORTOGRAM W/LOWER EXTREMITY;  Surgeon: [name], MD;  Location: [facility];  Service: Cardiovascular;  Laterality: N/A;  . ABDOMINOPLASTY    . ADENOIDECTOMY    . APPENDECTOMY  1995  . AV FISTULA PLACEMENT Left 02/01/2017   Procedure: ARTERIOVENOUS BRACHIOCEPHALIC (AV) FISTULA CREATION;  Surgeon: [name], MD;  Location: [facility];  Service: Vascular;  Laterality: Left;  . CHOLECYSTECTOMY  1993  . CORONARY ANGIOPLASTY WITH STENT PLACEMENT    . CORONARY STENT INTERVENTION N/A 08/05/2016   Procedure: Coronary Stent Intervention;  Surgeon: [name], MD;  Location: [facility];  Service: Cardiovascular;  Laterality: N/A;  . EXPLORATORY LAPAROTOMY  08/14/2005   lysis of adhesions, drainage of tubo-ovarian abscess  . FISTULA SUPERFICIALIZATION Left 09/14/2017   Procedure: FISTULA SUPERFICIALIZATION ARTERIOVENOUS FISTULA LEFT ARM;  Surgeon: [name], MD;  Location: [facility];  Service: Vascular;  Laterality: Left;  . FLEXIBLE SIGMOIDOSCOPY N/A 01/04/2019   Procedure: FLEXIBLE SIGMOIDOSCOPY;  Surgeon: Clarene Essex, MD;  Location: [facility];  Service: Endoscopy;  Laterality: N/A;  . HYDRADENITIS EXCISION  02/08/2011   Procedure: EXCISION HYDRADENITIS GROIN;  Surgeon: [name], MD;  Location: [facility];  Service: General;  Laterality: N/A;  Excisioin of Hidradenitis Left groin  . INCISION AND DRAINAGE ABSCESS N/A 01/11/2019   Procedure: INCISION AND DRAINAGE SACRAL ABSCESS;  Surgeon: Georganna Skeans, MD;  Location: [facility];  Service: General;  Laterality: N/A;  . INGUINAL HIDRADENITIS EXCISION  07/06/2010   bilateral  . INSERTION OF DIALYSIS CATHETER N/A 09/14/2017   Procedure: INSERTION OF TUNNELED DIALYSIS CATHETER Right Internal Jugular;  Surgeon: [name], MD;  Location: [employer];  Service: Vascular;  Laterality: N/A;  . IR FLUORO GUIDE CV LINE RIGHT   03/01/2019  . IR US GUIDE BX ASP/DRAIN  01/01/2019  . IR US GUIDE VASC ACCESS RIGHT  03/01/2019  . LEFT HEART CATH AND CORONARY ANGIOGRAPHY N/A 08/05/2016   Procedure: Left Heart Cath and Coronary Angiography;  Surgeon: [name], MD;  Location: [facility];  Service: Cardiovascular;  Laterality: N/A;  . PERIPHERAL VASCULAR INTERVENTION Left 12/14/2018   Procedure: PERIPHERAL VASCULAR INTERVENTION;  Surgeon: [name], MD;  Location: [facility];  Service: Cardiovascular;  Laterality: Left;  SFA  . PERIPHERAL VASCULAR INTERVENTION Left 04/25/2019   Procedure: PERIPHERAL VASCULAR INTERVENTION;  Surgeon: [name], MD;  Location: [facility];  Service: Cardiovascular;  Laterality: Left;  right superficial femoral, and left iliac  . REDUCTION MAMMAPLASTY  2002  . TONSILLECTOMY    . TUBAL LIGATION  1996  . VAGINAL HYSTERECTOMY  08/04/2005   and cysto     OB History   No obstetric history on file.     Family History  Problem Relation Age of Onset  . Allergic rhinitis Mother   . Hypertension Father   . Allergic rhinitis Father   .  Hypertension Sister   . Allergic rhinitis Sister   . Hypertension Brother   . Allergic rhinitis Brother   . Breast cancer Cousin        x2    Social History   Tobacco Use  . Smoking status: Former Smoker    Years: 20.00    Types: Cigarettes    Quit date: 11/2016    Years since quitting: 2.5  . Smokeless tobacco: Never Used  Substance Use Topics  . Alcohol use: No    Alcohol/week: 0.0 standard drinks  . Drug use: No    Home Medications Prior to Admission medications   Medication Sig Start Date End Date Taking? Authorizing Provider  albuterol (PROVENTIL HFA;VENTOLIN HFA) 108 (90 BASE) MCG/ACT inhaler Inhale 1-2 puffs into the lungs every 6 (six) hours as needed for wheezing or shortness of breath.     [provider]  Alirocumab (PRALUENT) 75 MG/ML SOAJ Inject 75 mg into the skin every 14  (fourteen) days. 01/24/19   [name], MD  amitriptyline (ELAVIL) 10 MG tablet Take 1 tablet (10 mg total) by mouth at bedtime. Patient not taking: Reported on 04/24/2019 09/19/18   Jamse Arn, MD  amLODipine (NORVASC) 10 MG tablet Take by mouth. 05/24/17   [provider]  aspirin 81 MG chewable tablet Chew 1 tablet (81 mg total) by mouth daily. 08/07/16   Cheryln Manly, NP  B Complex-C-Zn-Folic Acid (DIALYVITE 818 WITH ZINC) 0.8 MG TABS Take 1 tablet by mouth daily. 10/06/18   [provider]  calcitRIOL (ROCALTROL) 0.5 MCG capsule Take 1.5 mcg by mouth daily. 09/11/18   [provider]  calcium acetate (PHOSLO) 667 MG capsule Take 1,334-2,001 mg by mouth See admin instructions. Take 2001 mg by mouth up to three times daily with meals and 1334 mg with snacks 09/23/17   [provider]  clopidogrel (PLAVIX) 75 MG tablet Take 1 tablet (75 mg total) by mouth daily with breakfast. 10/19/17   Erlene Quan, PA-C  colchicine 0.6 MG tablet Take 1 tablet (0.6 mg total) by mouth daily. 06/08/19   Deno Etienne, DO  Fluticasone-Salmeterol (ADVAIR) 250-50 MCG/DOSE AEPB Inhale 1 puff into the lungs 2 (two) times daily. 06/10/17   Charlott Rakes, MD  gentamicin cream (GARAMYCIN) 0.1 % Apply 1 application topically See admin instructions. Apply to access site nightly 09/11/18   [provider]  ibuprofen (ADVIL) 600 MG tablet Take 1 tablet (600 mg total) by mouth 3 (three) times daily. 06/08/19 07/08/19  Deno Etienne, DO  insulin aspart (NOVOLOG) 100 UNIT/ML injection 0-6 Units, Subcutaneous, 3 times daily with meals CBG < 70: Implement Hypoglycemia measures/call MD CBG 70 - 120: 0 units CBG 121 - 150: 0 units CBG 151 - 200: 1 unit CBG 201-250: 2 units CBG 251-300: 3 units CBG 301-350: 4 units CBG 351-400: 5 units CBG > 400: Give 6 units and call MD Patient not taking: Reported on 05/29/2019 03/02/19   Jonetta Osgood, MD  insulin glargine, 2 Unit Dial, (TOUJEO MAX  SOLOSTAR) 300 UNIT/ML Solostar Pen Inject 54 Units into the skin at bedtime.     [provider]  insulin regular (NOVOLIN R) 100 units/mL injection Inject 0-10 Units into the skin 3 (three) times daily before meals.    [provider]  isosorbide mononitrate (IMDUR) 30 MG 24 hr tablet Take 0.5 tablets (15 mg total) by mouth daily. 10/19/17   Erlene Quan, PA-C  methocarbamol (ROBAXIN) 500 MG tablet  Take 1 tablet (500 mg total) by mouth 2 (two) times daily as needed for muscle spasms. 09/07/17   Jamse Arn, MD  metoCLOPramide (REGLAN) 5 MG tablet TAKE 1 TABLET BY MOUTH 4 TIMES DAILY BEFORE MEALS AND AT BEDTIME. Patient taking differently: Take 5 mg by mouth 4 (four) times daily -  before meals and at bedtime.  03/18/17   [name], MD  metoprolol succinate (TOPROL-XL) 50 MG 24 hr tablet Take 1 tablet (50 mg total) by mouth daily. 03/14/18   [name], MD  montelukast (SINGULAIR) 10 MG tablet Take 1 tablet (10 mg total) by mouth at bedtime. 04/12/18   Kennith Gain, MD  nitroGLYCERIN (NITROSTAT) 0.4 MG SL tablet Place 1 tablet (0.4 mg total) under the tongue every 5 (five) minutes as needed for chest pain. 10/19/17   Erlene Quan, PA-C  ondansetron (ZOFRAN ODT) 4 MG disintegrating tablet Take 1 tablet (4 mg total) by mouth every 8 (eight) hours as needed for nausea or vomiting. 03/02/19   Ghimire, Henreitta Leber, MD  pantoprazole (PROTONIX) 40 MG tablet TAKE 1 TABLET BY MOUTH DAILY. Patient taking differently: Take 40 mg by mouth daily.  02/14/18   [name], MD  pregabalin (LYRICA) 75 MG capsule Take 1 capsule (75 mg total) by mouth daily. 09/19/18   Jamse Arn, MD  trimethoprim-polymyxin b (POLYTRIM) ophthalmic solution Place 1 drop into both eyes See admin instructions. Instill one drop into both eyes four times daily for 2 days after each monthly eye injection 06/16/17   [provider]    Allergies    Repatha [evolocumab] and  Lisinopril  Review of Systems   Review of Systems  Constitutional: Negative for chills and fever.  HENT: Negative for congestion and rhinorrhea.   Eyes: Negative for redness and visual disturbance.  Respiratory: Positive for shortness of breath. Negative for wheezing.   Cardiovascular: Positive for chest pain. Negative for palpitations.  Gastrointestinal: Negative for nausea and vomiting.  Genitourinary: Negative for dysuria and urgency.  Musculoskeletal: Negative for arthralgias and myalgias.  Skin: Negative for pallor and wound.  Neurological: Negative for dizziness and headaches.    Physical Exam Updated Vital Signs BP (!) 154/87 (BP Location: Right Arm)   Pulse 60   Temp 99.6 F (37.6 C) (Oral)   Resp (!) 22   Ht 5\' 5"  (1.651 m)   Wt 66.5 kg   SpO2 96%   BMI 24.40 kg/m   Physical Exam Vitals and nursing note reviewed.  Constitutional:      General: She is not in acute distress.    Appearance: She is well-developed. She is not diaphoretic.  HENT:     Head: Normocephalic and atraumatic.  Eyes:     Pupils: Pupils are equal, round, and reactive to light.  Cardiovascular:     Rate and Rhythm: Normal rate and regular rhythm.     Heart sounds: No murmur. No friction rub. No gallop.   Pulmonary:     Effort: Pulmonary effort is normal.     Breath sounds: No wheezing or rales.  Chest:     Chest wall: No tenderness.  Abdominal:     General: There is no distension.     Palpations: Abdomen is soft.     Tenderness: There is no abdominal tenderness.  Musculoskeletal:        General: No tenderness.     Cervical back: Normal range of motion and neck supple.  Comments: Left AV fistula with palpable thrill  Skin:    General: Skin is warm and dry.  Neurological:     Mental Status: She is alert and oriented to person, place, and time.  Psychiatric:        Behavior: Behavior normal.     ED Results / Procedures / Treatments   Labs (all labs ordered are listed, but  only abnormal results are displayed) Labs Reviewed  BASIC METABOLIC PANEL - Abnormal; Notable for the following components:      Result Value   Potassium 3.3 (*)    Chloride 93 (*)    Glucose, Bld 121 (*)    BUN 56 (*)    Creatinine, Ser 7.85 (*)    GFR calc non Af Amer 6 (*)    GFR calc Af Amer 6 (*)    All other components within normal limits  CBC - Abnormal; Notable for the following components:   RBC 3.39 (*)    Hemoglobin 8.2 (*)    HCT 27.7 (*)    MCH 24.2 (*)    MCHC 29.6 (*)    RDW 16.1 (*)    All other components within normal limits  I-STAT BETA HCG BLOOD, ED ([facility], [facility], [facility] ONLY)  TROPONIN I (HIGH SENSITIVITY)  TROPONIN I (HIGH SENSITIVITY)    EKG EKG Interpretation  Date/Time:  Friday Jun 08 2019 14:48:47 EDT Ventricular Rate:  101 PR Interval:  172 QRS Duration: 78 QT Interval:  324 QTC Calculation: 420 R Axis:   69 Text Interpretation: Sinus tachycardia with Premature atrial complexes Nonspecific T wave abnormality Abnormal ECG Rate slower Confirmed by Deno Etienne [phone]) on 06/08/2019 5:17:54 PM   Radiology DG Chest 2 View  Result Date: 06/08/2019 CLINICAL DATA:  Chest pain and short of breath EXAM: CHEST - 2 VIEW COMPARISON:  02/24/2019 FINDINGS: Interval moderate cardiomegaly with globular cardiac configuration. Mild airspace disease at the left base. No pleural effusion or pulmonary edema. No pneumothorax. Mild aortic atherosclerosis. Small foe CIS of airspace disease in the left mid lung. IMPRESSION: 1. Interval moderate cardiomegaly with globular cardiac configuration suggesting possible pericardial effusion though multi chamber enlargement could also produce this appearance 2. Patchy airspace disease in the left mid lung and left lung base may reflect atelectasis or pneumonia Electronically Signed   By: Donavan Foil M.D.   On: 06/08/2019 15:39    Procedures Ultrasound ED Echo  Date/Time: 06/08/2019 9:31 PM Performed by: Deno Etienne, DO Authorized by:  Deno Etienne, DO   Procedure details:    Indications: chest pain and syncope     Views: subxiphoid, parasternal long axis view, apical 4 chamber view and IVC view     Images: archived     Limitations:  Positioning Findings:    Pericardium: moderate pericardial effusion     LV Function: normal (>50% EF)     RV Diameter: normal     IVC: dilated   Impression:    Impression: pericardial effusion present and probable elevated CVP     (including critical care time)  Medications Ordered in ED Medications  sodium chloride flush (NS) 0.9 % injection 3 mL (has no administration in time range)  ketorolac (TORADOL) injection 15 mg (15 mg Intramuscular Given 06/08/19 2002)  oxyCODONE (Oxy IR/ROXICODONE) immediate release tablet 5 mg (5 mg Oral Given 06/08/19 2002)    ED Course  I have reviewed the triage vital signs and the nursing notes.  Pertinent labs & imaging results that were available during  my care of the patient were reviewed by me and considered in my medical decision making (see chart for details).    MDM Rules/Calculators/A&P                      49 yo F with a chief complaints of shoulder pain.  This is in bilateral shoulders and now is starting to have some chest pain and shortness of breath.  Ongoing for the past couple weeks.  Symptoms worsen when she lays back flat improves when she sits up.  She has been well under her dry weight for her.  States compliance with dialysis.  Chest x-ray viewed by me with concern for organomegaly.  Read by radiology as possible pericardial effusion.  Bedside ultrasound is consistent with the same.  Symptoms could be consistent with pericarditis.  Will discuss with cardiology.  I discussed case with Dr. Marlou Porch.  He felt it would be reasonable to start this patient on NSAIDs and colchicine as long as it was thought to be okay by nephrology.  He would then schedule them to be seen soon in the office for formal ultrasound.  I discussed case with Dr.  Justin Mend, nephrology, he did feel it was reasonable to have the patient on NSAIDs just to have her watch out for GI bleeding.  I discussed the option with the patient about coming into the hospital to get a formal ultrasound tomorrow and a round of hemodialysis here to see if bringing down her dry weight may help.  She is currently declining and would like to go home.  9:32 PM:  I have discussed the diagnosis/risks/treatment options with the patient and believe the pt to be eligible for discharge home to follow-up with PCP, nephrology. We also discussed returning to the ED immediately if new or worsening sx occur. We discussed the sx which are most concerning (e.g., near syncope or syncope.  Fever, Blood in the stool or dark stool) that necessitate immediate return. Medications administered to the patient during their visit and any new prescriptions provided to the patient are listed below.  Medications given during this visit Medications  sodium chloride flush (NS) 0.9 % injection 3 mL (has no administration in time range)  ketorolac (TORADOL) injection 15 mg (15 mg Intramuscular Given 06/08/19 2002)  oxyCODONE (Oxy IR/ROXICODONE) immediate release tablet 5 mg (5 mg Oral Given 06/08/19 2002)     The patient appears reasonably screen and/or stabilized for discharge and I doubt any other medical condition or other [facility] requiring further screening, evaluation, or treatment in the ED at this time prior to discharge.   Final Clinical Impression(s) / ED Diagnoses Final diagnoses:  Acute pericardial effusion    Rx / DC Orders ED Discharge Orders         Ordered    colchicine 0.6 MG tablet  Daily     06/08/19 1956    ibuprofen (ADVIL) 600 MG tablet  3 times daily     06/08/19 1956           Deno Etienne, DO 06/08/19 2132

## 2019-06-08 NOTE — ED Notes (Signed)
Pt refused discharge vitals. RN attempted to review discharge instructions, pt took papers and didn't want them reviewed. RN informed her there are prescriptions to be picked up. Pt verbalized understanding.

## 2019-06-08 NOTE — ED Triage Notes (Addendum)
Pt reports bilateral shoulder pain x1 week with chest pain and sob x4 days, endorses some swelling in legs as well, hx of vascular issues, denies any recent travel. Pt a/ox4, resp e/u, nad. Does dialysis at home, last session was yesterday, skipped today due to feeling unwell.

## 2019-06-08 NOTE — ED Notes (Addendum)
Pt did not want an spo2 taken as it was "messing up" her nail. Pts heart rate was irregular

## 2019-06-11 DIAGNOSIS — N186 End stage renal disease: Secondary | ICD-10-CM | POA: Diagnosis not present

## 2019-06-11 DIAGNOSIS — D631 Anemia in chronic kidney disease: Secondary | ICD-10-CM | POA: Diagnosis not present

## 2019-06-11 DIAGNOSIS — E875 Hyperkalemia: Secondary | ICD-10-CM | POA: Diagnosis not present

## 2019-06-11 DIAGNOSIS — D509 Iron deficiency anemia, unspecified: Secondary | ICD-10-CM | POA: Diagnosis not present

## 2019-06-11 DIAGNOSIS — N2581 Secondary hyperparathyroidism of renal origin: Secondary | ICD-10-CM | POA: Diagnosis not present

## 2019-06-11 DIAGNOSIS — Z992 Dependence on renal dialysis: Secondary | ICD-10-CM | POA: Diagnosis not present

## 2019-06-12 ENCOUNTER — Other Ambulatory Visit: Payer: Self-pay | Admitting: Cardiology

## 2019-06-12 DIAGNOSIS — N186 End stage renal disease: Secondary | ICD-10-CM | POA: Diagnosis not present

## 2019-06-12 DIAGNOSIS — E440 Moderate protein-calorie malnutrition: Secondary | ICD-10-CM | POA: Diagnosis not present

## 2019-06-12 DIAGNOSIS — I319 Disease of pericardium, unspecified: Secondary | ICD-10-CM

## 2019-06-12 DIAGNOSIS — D631 Anemia in chronic kidney disease: Secondary | ICD-10-CM | POA: Diagnosis not present

## 2019-06-12 DIAGNOSIS — N2581 Secondary hyperparathyroidism of renal origin: Secondary | ICD-10-CM | POA: Diagnosis not present

## 2019-06-12 DIAGNOSIS — D649 Anemia, unspecified: Secondary | ICD-10-CM | POA: Diagnosis not present

## 2019-06-12 DIAGNOSIS — Z992 Dependence on renal dialysis: Secondary | ICD-10-CM | POA: Diagnosis not present

## 2019-06-12 DIAGNOSIS — D509 Iron deficiency anemia, unspecified: Secondary | ICD-10-CM | POA: Diagnosis not present

## 2019-06-13 DIAGNOSIS — N2581 Secondary hyperparathyroidism of renal origin: Secondary | ICD-10-CM | POA: Diagnosis not present

## 2019-06-13 DIAGNOSIS — D509 Iron deficiency anemia, unspecified: Secondary | ICD-10-CM | POA: Diagnosis not present

## 2019-06-13 DIAGNOSIS — D631 Anemia in chronic kidney disease: Secondary | ICD-10-CM | POA: Diagnosis not present

## 2019-06-13 DIAGNOSIS — D649 Anemia, unspecified: Secondary | ICD-10-CM | POA: Diagnosis not present

## 2019-06-13 DIAGNOSIS — R82998 Other abnormal findings in urine: Secondary | ICD-10-CM | POA: Diagnosis not present

## 2019-06-13 DIAGNOSIS — N186 End stage renal disease: Secondary | ICD-10-CM | POA: Diagnosis not present

## 2019-06-13 DIAGNOSIS — Z992 Dependence on renal dialysis: Secondary | ICD-10-CM | POA: Diagnosis not present

## 2019-06-14 DIAGNOSIS — N2581 Secondary hyperparathyroidism of renal origin: Secondary | ICD-10-CM | POA: Diagnosis not present

## 2019-06-14 DIAGNOSIS — D509 Iron deficiency anemia, unspecified: Secondary | ICD-10-CM | POA: Diagnosis not present

## 2019-06-14 DIAGNOSIS — N186 End stage renal disease: Secondary | ICD-10-CM | POA: Diagnosis not present

## 2019-06-14 DIAGNOSIS — D649 Anemia, unspecified: Secondary | ICD-10-CM | POA: Diagnosis not present

## 2019-06-14 DIAGNOSIS — Z992 Dependence on renal dialysis: Secondary | ICD-10-CM | POA: Diagnosis not present

## 2019-06-14 DIAGNOSIS — D631 Anemia in chronic kidney disease: Secondary | ICD-10-CM | POA: Diagnosis not present

## 2019-06-18 DIAGNOSIS — N2581 Secondary hyperparathyroidism of renal origin: Secondary | ICD-10-CM | POA: Diagnosis not present

## 2019-06-18 DIAGNOSIS — N186 End stage renal disease: Secondary | ICD-10-CM | POA: Diagnosis not present

## 2019-06-18 DIAGNOSIS — D631 Anemia in chronic kidney disease: Secondary | ICD-10-CM | POA: Diagnosis not present

## 2019-06-18 DIAGNOSIS — Z992 Dependence on renal dialysis: Secondary | ICD-10-CM | POA: Diagnosis not present

## 2019-06-18 DIAGNOSIS — D649 Anemia, unspecified: Secondary | ICD-10-CM | POA: Diagnosis not present

## 2019-06-18 DIAGNOSIS — D509 Iron deficiency anemia, unspecified: Secondary | ICD-10-CM | POA: Diagnosis not present

## 2019-06-19 DIAGNOSIS — D631 Anemia in chronic kidney disease: Secondary | ICD-10-CM | POA: Diagnosis not present

## 2019-06-19 DIAGNOSIS — Z992 Dependence on renal dialysis: Secondary | ICD-10-CM | POA: Diagnosis not present

## 2019-06-19 DIAGNOSIS — D649 Anemia, unspecified: Secondary | ICD-10-CM | POA: Diagnosis not present

## 2019-06-19 DIAGNOSIS — N186 End stage renal disease: Secondary | ICD-10-CM | POA: Diagnosis not present

## 2019-06-19 DIAGNOSIS — D509 Iron deficiency anemia, unspecified: Secondary | ICD-10-CM | POA: Diagnosis not present

## 2019-06-19 DIAGNOSIS — N2581 Secondary hyperparathyroidism of renal origin: Secondary | ICD-10-CM | POA: Diagnosis not present

## 2019-06-20 DIAGNOSIS — D509 Iron deficiency anemia, unspecified: Secondary | ICD-10-CM | POA: Diagnosis not present

## 2019-06-20 DIAGNOSIS — D631 Anemia in chronic kidney disease: Secondary | ICD-10-CM | POA: Diagnosis not present

## 2019-06-20 DIAGNOSIS — D649 Anemia, unspecified: Secondary | ICD-10-CM | POA: Diagnosis not present

## 2019-06-20 DIAGNOSIS — N186 End stage renal disease: Secondary | ICD-10-CM | POA: Diagnosis not present

## 2019-06-20 DIAGNOSIS — N2581 Secondary hyperparathyroidism of renal origin: Secondary | ICD-10-CM | POA: Diagnosis not present

## 2019-06-20 DIAGNOSIS — Z992 Dependence on renal dialysis: Secondary | ICD-10-CM | POA: Diagnosis not present

## 2019-06-21 DIAGNOSIS — Z992 Dependence on renal dialysis: Secondary | ICD-10-CM | POA: Diagnosis not present

## 2019-06-21 DIAGNOSIS — D631 Anemia in chronic kidney disease: Secondary | ICD-10-CM | POA: Diagnosis not present

## 2019-06-21 DIAGNOSIS — N2581 Secondary hyperparathyroidism of renal origin: Secondary | ICD-10-CM | POA: Diagnosis not present

## 2019-06-21 DIAGNOSIS — N186 End stage renal disease: Secondary | ICD-10-CM | POA: Diagnosis not present

## 2019-06-21 DIAGNOSIS — D509 Iron deficiency anemia, unspecified: Secondary | ICD-10-CM | POA: Diagnosis not present

## 2019-06-21 DIAGNOSIS — D649 Anemia, unspecified: Secondary | ICD-10-CM | POA: Diagnosis not present

## 2019-06-22 DIAGNOSIS — N2581 Secondary hyperparathyroidism of renal origin: Secondary | ICD-10-CM | POA: Diagnosis not present

## 2019-06-22 DIAGNOSIS — N186 End stage renal disease: Secondary | ICD-10-CM | POA: Diagnosis not present

## 2019-06-22 DIAGNOSIS — D631 Anemia in chronic kidney disease: Secondary | ICD-10-CM | POA: Diagnosis not present

## 2019-06-22 DIAGNOSIS — D509 Iron deficiency anemia, unspecified: Secondary | ICD-10-CM | POA: Diagnosis not present

## 2019-06-22 DIAGNOSIS — D649 Anemia, unspecified: Secondary | ICD-10-CM | POA: Diagnosis not present

## 2019-06-22 DIAGNOSIS — Z992 Dependence on renal dialysis: Secondary | ICD-10-CM | POA: Diagnosis not present

## 2019-06-25 DIAGNOSIS — D649 Anemia, unspecified: Secondary | ICD-10-CM | POA: Diagnosis not present

## 2019-06-25 DIAGNOSIS — D631 Anemia in chronic kidney disease: Secondary | ICD-10-CM | POA: Diagnosis not present

## 2019-06-25 DIAGNOSIS — N186 End stage renal disease: Secondary | ICD-10-CM | POA: Diagnosis not present

## 2019-06-25 DIAGNOSIS — Z992 Dependence on renal dialysis: Secondary | ICD-10-CM | POA: Diagnosis not present

## 2019-06-25 DIAGNOSIS — D509 Iron deficiency anemia, unspecified: Secondary | ICD-10-CM | POA: Diagnosis not present

## 2019-06-25 DIAGNOSIS — N2581 Secondary hyperparathyroidism of renal origin: Secondary | ICD-10-CM | POA: Diagnosis not present

## 2019-06-26 DIAGNOSIS — N186 End stage renal disease: Secondary | ICD-10-CM | POA: Diagnosis not present

## 2019-06-26 DIAGNOSIS — N2581 Secondary hyperparathyroidism of renal origin: Secondary | ICD-10-CM | POA: Diagnosis not present

## 2019-06-26 DIAGNOSIS — Z992 Dependence on renal dialysis: Secondary | ICD-10-CM | POA: Diagnosis not present

## 2019-06-26 DIAGNOSIS — D649 Anemia, unspecified: Secondary | ICD-10-CM | POA: Diagnosis not present

## 2019-06-26 DIAGNOSIS — D631 Anemia in chronic kidney disease: Secondary | ICD-10-CM | POA: Diagnosis not present

## 2019-06-26 DIAGNOSIS — D509 Iron deficiency anemia, unspecified: Secondary | ICD-10-CM | POA: Diagnosis not present

## 2019-06-28 DIAGNOSIS — N2581 Secondary hyperparathyroidism of renal origin: Secondary | ICD-10-CM | POA: Diagnosis not present

## 2019-06-28 DIAGNOSIS — Z992 Dependence on renal dialysis: Secondary | ICD-10-CM | POA: Diagnosis not present

## 2019-06-28 DIAGNOSIS — D509 Iron deficiency anemia, unspecified: Secondary | ICD-10-CM | POA: Diagnosis not present

## 2019-06-28 DIAGNOSIS — N186 End stage renal disease: Secondary | ICD-10-CM | POA: Diagnosis not present

## 2019-06-28 DIAGNOSIS — D631 Anemia in chronic kidney disease: Secondary | ICD-10-CM | POA: Diagnosis not present

## 2019-06-28 DIAGNOSIS — D649 Anemia, unspecified: Secondary | ICD-10-CM | POA: Diagnosis not present

## 2019-06-29 DIAGNOSIS — D631 Anemia in chronic kidney disease: Secondary | ICD-10-CM | POA: Diagnosis not present

## 2019-06-29 DIAGNOSIS — N186 End stage renal disease: Secondary | ICD-10-CM | POA: Diagnosis not present

## 2019-06-29 DIAGNOSIS — D649 Anemia, unspecified: Secondary | ICD-10-CM | POA: Diagnosis not present

## 2019-06-29 DIAGNOSIS — Z992 Dependence on renal dialysis: Secondary | ICD-10-CM | POA: Diagnosis not present

## 2019-06-29 DIAGNOSIS — D509 Iron deficiency anemia, unspecified: Secondary | ICD-10-CM | POA: Diagnosis not present

## 2019-06-29 DIAGNOSIS — N2581 Secondary hyperparathyroidism of renal origin: Secondary | ICD-10-CM | POA: Diagnosis not present

## 2019-07-02 ENCOUNTER — Other Ambulatory Visit: Payer: Self-pay

## 2019-07-02 ENCOUNTER — Ambulatory Visit (HOSPITAL_COMMUNITY): Payer: Medicare Other | Attending: Cardiology

## 2019-07-02 DIAGNOSIS — Z992 Dependence on renal dialysis: Secondary | ICD-10-CM | POA: Diagnosis not present

## 2019-07-02 DIAGNOSIS — N186 End stage renal disease: Secondary | ICD-10-CM | POA: Diagnosis not present

## 2019-07-02 DIAGNOSIS — D649 Anemia, unspecified: Secondary | ICD-10-CM | POA: Diagnosis not present

## 2019-07-02 DIAGNOSIS — D509 Iron deficiency anemia, unspecified: Secondary | ICD-10-CM | POA: Diagnosis not present

## 2019-07-02 DIAGNOSIS — N2581 Secondary hyperparathyroidism of renal origin: Secondary | ICD-10-CM | POA: Diagnosis not present

## 2019-07-02 DIAGNOSIS — I319 Disease of pericardium, unspecified: Secondary | ICD-10-CM

## 2019-07-02 DIAGNOSIS — D631 Anemia in chronic kidney disease: Secondary | ICD-10-CM | POA: Diagnosis not present

## 2019-07-03 ENCOUNTER — Encounter: Payer: Self-pay | Admitting: Cardiovascular Disease

## 2019-07-03 ENCOUNTER — Ambulatory Visit (INDEPENDENT_AMBULATORY_CARE_PROVIDER_SITE_OTHER): Payer: Medicare Other | Admitting: Cardiovascular Disease

## 2019-07-03 DIAGNOSIS — Z992 Dependence on renal dialysis: Secondary | ICD-10-CM | POA: Diagnosis not present

## 2019-07-03 DIAGNOSIS — I2510 Atherosclerotic heart disease of native coronary artery without angina pectoris: Secondary | ICD-10-CM

## 2019-07-03 DIAGNOSIS — N186 End stage renal disease: Secondary | ICD-10-CM | POA: Diagnosis not present

## 2019-07-03 DIAGNOSIS — Z9861 Coronary angioplasty status: Secondary | ICD-10-CM | POA: Diagnosis not present

## 2019-07-03 DIAGNOSIS — I5032 Chronic diastolic (congestive) heart failure: Secondary | ICD-10-CM

## 2019-07-03 DIAGNOSIS — D649 Anemia, unspecified: Secondary | ICD-10-CM | POA: Diagnosis not present

## 2019-07-03 DIAGNOSIS — D631 Anemia in chronic kidney disease: Secondary | ICD-10-CM | POA: Diagnosis not present

## 2019-07-03 DIAGNOSIS — D509 Iron deficiency anemia, unspecified: Secondary | ICD-10-CM | POA: Diagnosis not present

## 2019-07-03 DIAGNOSIS — N2581 Secondary hyperparathyroidism of renal origin: Secondary | ICD-10-CM | POA: Diagnosis not present

## 2019-07-03 MED ORDER — HYDRALAZINE HCL 25 MG PO TABS
25.0000 mg | ORAL_TABLET | Freq: Three times a day (TID) | ORAL | 3 refills | Status: DC
Start: 2019-07-03 — End: 2019-08-06

## 2019-07-03 MED ORDER — METOPROLOL SUCCINATE ER 50 MG PO TB24: ORAL_TABLET | ORAL | 3 refills | Status: DC

## 2019-07-03 NOTE — Assessment & Plan Note (Signed)
History of essential hypertension a blood pressure measured today 194/90.  She has been checking her blood pressures at home and they have been running high over the last several weeks.  She is on amlodipine 10 mg a day and Toprol-XL 50 mg a day.  She is somewhat tachycardic as well.  She says that she is out of below her dry weight after her peritoneal dialysis.  I am going to increase her Toprol-XL from 50 to 75 mg a day and add hydralazine 25 mg p.o. 3 times daily.  She can we will keep a blood pressure log and see a PA APP or Pharm.D. back in 2 to 3 weeks to review make changes

## 2019-07-03 NOTE — Progress Notes (Signed)
07/03/2019 Susan Horton   06-22-1970  [id number]  Primary Physician Charlott Rakes, MD Primary Cardiologist: [name] MD [name], [place]  HPI:  Susan Horton is a 49 y.o.  moderately overweight single African-American female mother of 53, grandmother and 4 grandchildren whoI last saw in the office  04/03/2019.She has a history of treated hypertension, hyperlipidemia and diabetes which she's had for the last 22 years. She has CKD 3 with creatinine running in the mid 3 range followed by Dr. Jimmy Footman. She's noticed chest pain over the last 3-4 months occurring several times a week lasting minutes at a time worse with exertion. She had a Myoview stress test was read as intermediate risk with inferior ischemia. Based on this, I decided to proceed with outpatient radial diagnostic coronary angiography using limited contrast. This was performed 08/05/16 the right femoral approach revealed an 80% mid LAD which was stented using a synergy drug-eluting stent reducing 80% stenosis to 0% residual. She did have moderate mid RCA stenosis which was not addressed. She had left main spasm probably related to the guide catheter doing a procedure that responded to intra-coronary nitroglycerin. Her chest pain has resolved and she has stopped smoking since. She did have carotid Dopplers performed because of bruits that showed mild to moderate left ICA stenosis which will be followed on an annual basis. Since I saw her 4 months ago she has done well. She gets occasional chest pain during hemodialysis. She recently had a peritoneal dialysis placed yesterday and is planning on transitioning to home peritoneal dialysis. We have also talked about going on the kidney transplant list although her hemoglobin A1c is still elevated in the 11 range. She has stopped smoking. She was recently begun on Repatha for resistant hyperlipidemiabut unfortunately there was a lapse in her insurance last several  months. She will restart this.  She is tolerating peritoneal dialysis without difficulty.She has had right SFA intervention by Dr. Carlis Abbott recently. She had mentioned at her last visit that she was complaining of some mild increase in dyspnea on exertion. As a result of this, I ordered a 2D echo 12/22/2018 which was normal and a Myoview stress test 12/21/2018 which was normal as well.  She was admitted in February for sacral abscess and since that time has had worsening fatigue weakness and shortness of breath.  Since I saw her 3 months ago she has started peritoneal dialysis.  She complains of some dyspnea on exertion which is increased as well as elevated blood pressures both on and off dialysis days.  She did have a negative Myoview back in December and a 2D echo performed yesterday that showed normal LV systolic function, grade 2 diastolic dysfunction with moderate concentric LVH and otherwise normal valvular function.   Current Meds  Medication Sig  . albuterol (PROVENTIL HFA;VENTOLIN HFA) 108 (90 BASE) MCG/ACT inhaler Inhale 1-2 puffs into the lungs every 6 (six) hours as needed for wheezing or shortness of breath.   . Alirocumab (PRALUENT) 75 MG/ML SOAJ Inject 75 mg into the skin every 14 (fourteen) days.  [name] amLODipine (NORVASC) 10 MG tablet Take by mouth.  [name] aspirin 81 MG chewable tablet Chew 1 tablet (81 mg total) by mouth daily.  . B Complex-C-Zn-Folic Acid (DIALYVITE 409 WITH ZINC) 0.8 MG TABS Take 1 tablet by mouth daily.  . calcitRIOL (ROCALTROL) 0.5 MCG capsule Take 1.5 mcg by mouth daily.  . calcium acetate (PHOSLO) 667 MG capsule Take 1,334-2,001 mg by  mouth See admin instructions. Take 2001 mg by mouth up to three times daily with meals and 1334 mg with snacks  . clopidogrel (PLAVIX) 75 MG tablet Take 1 tablet (75 mg total) by mouth daily with breakfast.  . colchicine 0.6 MG tablet Take 1 tablet (0.6 mg total) by mouth daily.  . Fluticasone-Salmeterol (ADVAIR) 250-50  MCG/DOSE AEPB Inhale 1 puff into the lungs 2 (two) times daily.  [name] gentamicin cream (GARAMYCIN) 0.1 % Apply 1 application topically See admin instructions. Apply to access site nightly  . ibuprofen (ADVIL) 600 MG tablet Take 1 tablet (600 mg total) by mouth 3 (three) times daily.  . insulin aspart (NOVOLOG) 100 UNIT/ML injection 0-6 Units, Subcutaneous, 3 times daily with meals CBG < 70: Implement Hypoglycemia measures/call MD CBG 70 - 120: 0 units CBG 121 - 150: 0 units CBG 151 - 200: 1 unit CBG 201-250: 2 units CBG 251-300: 3 units CBG 301-350: 4 units CBG 351-400: 5 units CBG > 400: Give 6 units and call MD  . insulin glargine, 2 Unit Dial, (TOUJEO MAX SOLOSTAR) 300 UNIT/ML Solostar Pen Inject 54 Units into the skin at bedtime.   . insulin regular (NOVOLIN R) 100 units/mL injection Inject 0-10 Units into the skin 3 (three) times daily before meals.  . isosorbide mononitrate (IMDUR) 30 MG 24 hr tablet Take 0.5 tablets (15 mg total) by mouth daily.  . methocarbamol (ROBAXIN) 500 MG tablet Take 1 tablet (500 mg total) by mouth 2 (two) times daily as needed for muscle spasms.  . metoCLOPramide (REGLAN) 5 MG tablet TAKE 1 TABLET BY MOUTH 4 TIMES DAILY BEFORE MEALS AND AT BEDTIME. (Patient taking differently: Take 5 mg by mouth 4 (four) times daily -  before meals and at bedtime. )  . metoprolol succinate (TOPROL-XL) 50 MG 24 hr tablet Take 75 mg (1.5 tablet) daily  . montelukast (SINGULAIR) 10 MG tablet Take 1 tablet (10 mg total) by mouth at bedtime.  . nitroGLYCERIN (NITROSTAT) 0.4 MG SL tablet Place 1 tablet (0.4 mg total) under the tongue every 5 (five) minutes as needed for chest pain.  [name] ondansetron (ZOFRAN ODT) 4 MG disintegrating tablet Take 1 tablet (4 mg total) by mouth every 8 (eight) hours as needed for nausea or vomiting.  . pantoprazole (PROTONIX) 40 MG tablet TAKE 1 TABLET BY MOUTH DAILY. (Patient taking differently: Take 40 mg by mouth daily. )  . pregabalin (LYRICA) 75 MG capsule Take 1  capsule (75 mg total) by mouth daily.  [name] trimethoprim-polymyxin b (POLYTRIM) ophthalmic solution Place 1 drop into both eyes See admin instructions. Instill one drop into both eyes four times daily for 2 days after each monthly eye injection  . [DISCONTINUED] metoprolol succinate (TOPROL-XL) 50 MG 24 hr tablet Take 1 tablet (50 mg total) by mouth daily.     Allergies  Allergen Reactions  . Repatha [Evolocumab] Itching  . Other Itching  . Lisinopril Cough    Social History   Socioeconomic History  . Marital status: Single    Spouse name: Not on file  . Number of children: 3  . Years of education: 64  . Highest education level: Not on file  Occupational History  . Occupation: Curator  Tobacco Use  . Smoking status: Former Smoker    Years: 20.00    Types: Cigarettes    Quit date: 11/2016    Years since quitting: 2.6  . Smokeless tobacco: Never Used  Vaping Use  . Vaping Use: Never used  Substance and Sexual Activity  . Alcohol use: No    Alcohol/week: 0.0 standard drinks  . Drug use: No  . Sexual activity: Not on file  Other Topics Concern  . Not on file  Social History Narrative   Lives with daughter in a 2 story home.  Has 3 children.  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  Education: college.    Social Determinants of Health   Financial Resource Strain:   . Difficulty of Paying Living Expenses:   Food Insecurity:   . Worried About Charity fundraiser in the Last Year:   . Arboriculturist in the Last Year:   Transportation Needs:   . Film/video editor (Medical):   [name] Lack of Transportation (Non-Medical):   Physical Activity:   . Days of Exercise per Week:   . Minutes of Exercise per Session:   Stress:   . Feeling of Stress :   Social Connections:   . Frequency of Communication with Friends and Family:   . Frequency of Social Gatherings with Friends and Family:   . Attends Religious Services:   . Active Member of Clubs  or Organizations:   . Attends Archivist Meetings:   [name] Marital Status:   Intimate Partner Violence:   . Fear of Current or Ex-Partner:   . Emotionally Abused:   [name] Physically Abused:   . Sexually Abused:      Review of Systems: General: negative for chills, fever, night sweats or weight changes.  Cardiovascular: negative for chest pain, dyspnea on exertion, edema, orthopnea, palpitations, paroxysmal nocturnal dyspnea or shortness of breath Dermatological: negative for rash Respiratory: negative for cough or wheezing Urologic: negative for hematuria Abdominal: negative for nausea, vomiting, diarrhea, bright red blood per rectum, melena, or hematemesis Neurologic: negative for visual changes, syncope, or dizziness All other systems reviewed and are otherwise negative except as noted above.    Blood pressure (!) 194/90, pulse (!) 106, height 5\' 5"  (1.651 m), weight 149 lb 3.2 oz (67.7 kg), SpO2 98 %.  General appearance: alert and no distress Neck: no adenopathy, no JVD, supple, symmetrical, trachea midline, thyroid not enlarged, symmetric, no tenderness/mass/nodules and Bilateral carotid bruits versus versus transmitted murmur Lungs: clear to auscultation bilaterally Heart: 2/6 systolic ejection murmur at the base consistent with aortic stenosis and/or sclerosis Extremities: extremities normal, atraumatic, no cyanosis or edema Pulses: 2+ and symmetric Skin: Skin color, texture, turgor normal. No rashes or lesions Neurologic: Alert and oriented X 3, normal strength and tone. Normal symmetric reflexes. Normal coordination and gait  EKG sinus tachycardia 106 with evidence of borderline LVH and nonspecific ST and T wave changes with PACs.  I personally reviewed this EKG.  ASSESSMENT AND PLAN:   Essential hypertension, benign History of essential hypertension a blood pressure measured today 194/90.  She has been checking her blood pressures at home and they have been running  high over the last several weeks.  She is on amlodipine 10 mg a day and Toprol-XL 50 mg a day.  She is somewhat tachycardic as well.  She says that she is out of below her dry weight after her peritoneal dialysis.  I am going to increase her Toprol-XL from 50 to 75 mg a day and add hydralazine 25 mg p.o. 3 times daily.  She can we will keep a blood pressure log and see a PA APP or Pharm.D. back in 2 to 3 weeks to review make changes  Chronic diastolic CHF (congestive heart failure) (HCC) History of chronic diastolic heart failure on peritoneal dialysis with 2D echo performed yesterday revealing normal LV systolic function with moderate concentric LVH and aortic sclerosis without stenosis.  She had grade 2 diastolic dysfunction.      [name] MD FACP,FACC,FAHA, [facility] 07/03/2019 4:43 PM

## 2019-07-03 NOTE — Patient Instructions (Addendum)
Medication Instructions:  INCREASE metoprolol succinate (Toprol XL) to 75 mg daily  START hydralazine 25 mg three times daily  *If you need a refill on your cardiac medications before your next appointment, please call your pharmacy*  Follow-Up: At [facility], you and your health needs are our priority.  As part of our continuing mission to provide you with exceptional heart care, we have created designated Provider Care Teams.  These Care Teams include your primary Cardiologist (physician) and Advanced Practice Providers (APPs -  Physician Assistants and Nurse Practitioners) who all work together to provide you with the care you need, when you need it.  We recommend signing up for the patient portal called "MyChart".  Sign up information is provided on this After Visit Summary.  MyChart is used to connect with patients for Virtual Visits (Telemedicine).  Patients are able to view lab/test results, encounter notes, upcoming appointments, etc.  Non-urgent messages can be sent to your provider as well.   To learn more about what you can do with MyChart, go to [email or web address].    Your next appointment:   2 -3 weeks with APP or pharmacist 3 months with Dr. Gwenlyn Found   Other Instructions Please check your blood pressure at home daily, write it down.  Bring this log to your next appointment.

## 2019-07-03 NOTE — Assessment & Plan Note (Signed)
History of chronic diastolic heart failure on peritoneal dialysis with 2D echo performed yesterday revealing normal LV systolic function with moderate concentric LVH and aortic sclerosis without stenosis.  She had grade 2 diastolic dysfunction.

## 2019-07-04 ENCOUNTER — Ambulatory Visit: Payer: Medicare Other | Admitting: Cardiology

## 2019-07-05 ENCOUNTER — Telehealth: Payer: Self-pay | Admitting: *Deleted

## 2019-07-05 DIAGNOSIS — Z992 Dependence on renal dialysis: Secondary | ICD-10-CM | POA: Diagnosis not present

## 2019-07-05 DIAGNOSIS — N186 End stage renal disease: Secondary | ICD-10-CM | POA: Diagnosis not present

## 2019-07-05 NOTE — Telephone Encounter (Signed)
Spoke with patient regarding 2-3 week follow up with an APP ordered by Dr. Donata Duff for 07/23/19 at 3:15 pm----3 month follow up with Dr. Gwenlyn Found scheduled Wednesday 10/10/19 at 3:00pm---Appointment with Dr. Gwenlyn Found on 07/18/19 was cancelled ---patient seen 07/03/19.  Patient voiced her understanding and I will mail information to her.

## 2019-07-06 DIAGNOSIS — N186 End stage renal disease: Secondary | ICD-10-CM | POA: Diagnosis not present

## 2019-07-06 DIAGNOSIS — D509 Iron deficiency anemia, unspecified: Secondary | ICD-10-CM | POA: Diagnosis not present

## 2019-07-06 DIAGNOSIS — D631 Anemia in chronic kidney disease: Secondary | ICD-10-CM | POA: Diagnosis not present

## 2019-07-06 DIAGNOSIS — N2581 Secondary hyperparathyroidism of renal origin: Secondary | ICD-10-CM | POA: Diagnosis not present

## 2019-07-06 DIAGNOSIS — D649 Anemia, unspecified: Secondary | ICD-10-CM | POA: Diagnosis not present

## 2019-07-06 DIAGNOSIS — Z992 Dependence on renal dialysis: Secondary | ICD-10-CM | POA: Diagnosis not present

## 2019-07-09 DIAGNOSIS — N2581 Secondary hyperparathyroidism of renal origin: Secondary | ICD-10-CM | POA: Diagnosis not present

## 2019-07-09 DIAGNOSIS — D649 Anemia, unspecified: Secondary | ICD-10-CM | POA: Diagnosis not present

## 2019-07-09 DIAGNOSIS — N186 End stage renal disease: Secondary | ICD-10-CM | POA: Diagnosis not present

## 2019-07-09 DIAGNOSIS — Z992 Dependence on renal dialysis: Secondary | ICD-10-CM | POA: Diagnosis not present

## 2019-07-09 DIAGNOSIS — D631 Anemia in chronic kidney disease: Secondary | ICD-10-CM | POA: Diagnosis not present

## 2019-07-09 DIAGNOSIS — D509 Iron deficiency anemia, unspecified: Secondary | ICD-10-CM | POA: Diagnosis not present

## 2019-07-10 DIAGNOSIS — D631 Anemia in chronic kidney disease: Secondary | ICD-10-CM | POA: Diagnosis not present

## 2019-07-10 DIAGNOSIS — N186 End stage renal disease: Secondary | ICD-10-CM | POA: Diagnosis not present

## 2019-07-10 DIAGNOSIS — Z992 Dependence on renal dialysis: Secondary | ICD-10-CM | POA: Diagnosis not present

## 2019-07-10 DIAGNOSIS — N2581 Secondary hyperparathyroidism of renal origin: Secondary | ICD-10-CM | POA: Diagnosis not present

## 2019-07-10 DIAGNOSIS — D509 Iron deficiency anemia, unspecified: Secondary | ICD-10-CM | POA: Diagnosis not present

## 2019-07-10 DIAGNOSIS — D649 Anemia, unspecified: Secondary | ICD-10-CM | POA: Diagnosis not present

## 2019-07-11 ENCOUNTER — Ambulatory Visit (INDEPENDENT_AMBULATORY_CARE_PROVIDER_SITE_OTHER): Payer: Medicare Other | Admitting: Physician Assistant

## 2019-07-11 ENCOUNTER — Ambulatory Visit (HOSPITAL_COMMUNITY)
Admission: RE | Admit: 2019-07-11 | Discharge: 2019-07-11 | Disposition: A | Discharge status: Home / Self Care | Payer: Medicare Other | Source: Ambulatory Visit | Attending: Vascular Surgery | Admitting: Vascular Surgery

## 2019-07-11 ENCOUNTER — Other Ambulatory Visit: Payer: Self-pay

## 2019-07-11 ENCOUNTER — Ambulatory Visit (INDEPENDENT_AMBULATORY_CARE_PROVIDER_SITE_OTHER)
Admission: RE | Admit: 2019-07-11 | Discharge: 2019-07-11 | Disposition: A | Discharge status: Home / Self Care | Payer: Medicare Other | Source: Ambulatory Visit | Attending: Vascular Surgery | Admitting: Vascular Surgery

## 2019-07-11 VITALS — BP 161/71 | HR 86 | Temp 97.5°F | Resp 20 | Ht 65.0 in | Wt 147.9 lb

## 2019-07-11 DIAGNOSIS — Z992 Dependence on renal dialysis: Secondary | ICD-10-CM | POA: Diagnosis not present

## 2019-07-11 DIAGNOSIS — I2510 Atherosclerotic heart disease of native coronary artery without angina pectoris: Secondary | ICD-10-CM

## 2019-07-11 DIAGNOSIS — D631 Anemia in chronic kidney disease: Secondary | ICD-10-CM | POA: Diagnosis not present

## 2019-07-11 DIAGNOSIS — D649 Anemia, unspecified: Secondary | ICD-10-CM | POA: Diagnosis not present

## 2019-07-11 DIAGNOSIS — N2581 Secondary hyperparathyroidism of renal origin: Secondary | ICD-10-CM | POA: Diagnosis not present

## 2019-07-11 DIAGNOSIS — I739 Peripheral vascular disease, unspecified: Secondary | ICD-10-CM

## 2019-07-11 DIAGNOSIS — N186 End stage renal disease: Secondary | ICD-10-CM | POA: Diagnosis not present

## 2019-07-11 DIAGNOSIS — Z9861 Coronary angioplasty status: Secondary | ICD-10-CM

## 2019-07-11 DIAGNOSIS — D509 Iron deficiency anemia, unspecified: Secondary | ICD-10-CM | POA: Diagnosis not present

## 2019-07-11 NOTE — Progress Notes (Addendum)
Office Note     CC:  follow up of left EIA stent Requesting Provider:  Charlott Rakes, MD  HPI: Susan Horton is a 49 y.o. (09-01-1970) female who presents for 6 weeks follow-up after undergoing right lower extremity aortogram with intervention on April 25, 2019.    She just got back from a vacation at the beach and was able to walk the boardwalk without her cane or claudication. No rest pain or nonhealing ulcers/skin lesions.  She underwent right SFA angioplasty with stent placement as well as left external iliac angioplasty with stent placement by Dr. Carlis Abbott for lifestyle limiting claudication.  At her last visit her symptoms had improved.  Her left ABI and TBI had both increased.  Duplex study showed a 50 to 99% stenosis just distal to her recently placed SFA stent.  "At the end of the case we did stent the left external iliac high-grade stenosis given that we previously intervened on the left side and she has left SFA stents and thislesioncould threaten long-term patency of her stents."  Previous angioplasty and stent placement to left SFA on December 14, 2018 by Dr. Carlis Abbott for lifestyle limiting short distance claudication.  Past medical history and medications reviewed.  She is compliant with aspirin, Plavix. Now on monoclonal antibody therapy for LDL cholesterol. Continues hemodialysis via LUE AVF. No recent A1c in chart. She thinks her last value was between 6-7%.  Is followed by Dr. Alvester Chou for bilateral carotid artery bruits and chronic diastolic heart failure Had recent 2D echo.  Normal LV systolic function.  Grade 2 diastolic dysfunction. Stop smoking in 2018 Past Medical History:  Diagnosis Date  . Abnormal stress test   . Anemia   . Angio-edema   . Asthma   . CAD (coronary artery disease)    DES to mid LAD July 2018, residual moderate RCA disease  . Cataract   . CKD (chronic kidney disease) stage 4, GFR 15-29 ml/min (HCC)    Dialysis T/Th/Sa  . DVT (deep venous thrombosis)  ([place])    1996 during pregnancy, 2015 left leg  . Eczema   . Essential hypertension 12/11/2015  . Gastroparesis   . GERD (gastroesophageal reflux disease)   . Gluteal abscess 12/27/2018  . Hidradenitis   . Migraine   . Neuropathy   . Peripheral vascular disease ([place])    blood clot in leg  . Progressive angina ([place]) 06/05/2014   Chest pain  . Type 2 diabetes mellitus ([place])   . Type II diabetes mellitus ([place])   . Urticaria     Past Surgical History:  Procedure Laterality Date  . A/V FISTULAGRAM N/A 06/22/2017   Procedure: A/V FISTULAGRAM - left arm;  Surgeon: [name], MD;  Location: [facility];  Service: Cardiovascular;  Laterality: N/A;  . ABDOMINAL AORTOGRAM W/LOWER EXTREMITY Bilateral 12/14/2018   Procedure: ABDOMINAL AORTOGRAM W/LOWER EXTREMITY;  Surgeon: [name], MD;  Location: [facility];  Service: Cardiovascular;  Laterality: Bilateral;  . ABDOMINAL AORTOGRAM W/LOWER EXTREMITY N/A 04/25/2019   Procedure: ABDOMINAL AORTOGRAM W/LOWER EXTREMITY;  Surgeon: [name], MD;  Location: [facility];  Service: Cardiovascular;  Laterality: N/A;  . ABDOMINOPLASTY    . ADENOIDECTOMY    . APPENDECTOMY  1995  . AV FISTULA PLACEMENT Left 02/01/2017   Procedure: ARTERIOVENOUS BRACHIOCEPHALIC (AV) FISTULA CREATION;  Surgeon: [name], MD;  Location: [facility];  Service: Vascular;  Laterality: Left;  . CHOLECYSTECTOMY  1993  . CORONARY  ANGIOPLASTY WITH STENT PLACEMENT    . CORONARY STENT INTERVENTION N/A 08/05/2016   Procedure: Coronary Stent Intervention;  Surgeon: [name], MD;  Location: [facility];  Service: Cardiovascular;  Laterality: N/A;  . EXPLORATORY LAPAROTOMY  08/14/2005   lysis of adhesions, drainage of tubo-ovarian abscess  . FISTULA SUPERFICIALIZATION Left 09/14/2017   Procedure: FISTULA SUPERFICIALIZATION ARTERIOVENOUS FISTULA LEFT ARM;  Surgeon: [name], MD;  Location: [facility];  Service:  Vascular;  Laterality: Left;  . FLEXIBLE SIGMOIDOSCOPY N/A 01/04/2019   Procedure: FLEXIBLE SIGMOIDOSCOPY;  Surgeon: Clarene Essex, MD;  Location: [facility];  Service: Endoscopy;  Laterality: N/A;  . HYDRADENITIS EXCISION  02/08/2011   Procedure: EXCISION HYDRADENITIS GROIN;  Surgeon: [name], MD;  Location: [facility];  Service: General;  Laterality: N/A;  Excisioin of Hidradenitis Left groin  . INCISION AND DRAINAGE ABSCESS N/A 01/11/2019   Procedure: INCISION AND DRAINAGE SACRAL ABSCESS;  Surgeon: Georganna Skeans, MD;  Location: [facility];  Service: General;  Laterality: N/A;  . INGUINAL HIDRADENITIS EXCISION  07/06/2010   bilateral  . INSERTION OF DIALYSIS CATHETER N/A 09/14/2017   Procedure: INSERTION OF TUNNELED DIALYSIS CATHETER Right Internal Jugular;  Surgeon: [name], MD;  Location: [facility];  Service: Vascular;  Laterality: N/A;  . IR FLUORO GUIDE CV LINE RIGHT  03/01/2019  . IR US GUIDE BX ASP/DRAIN  01/01/2019  . IR US GUIDE VASC ACCESS RIGHT  03/01/2019  . LEFT HEART CATH AND CORONARY ANGIOGRAPHY N/A 08/05/2016   Procedure: Left Heart Cath and Coronary Angiography;  Surgeon: [name], MD;  Location: [facility];  Service: Cardiovascular;  Laterality: N/A;  . PERIPHERAL VASCULAR INTERVENTION Left 12/14/2018   Procedure: PERIPHERAL VASCULAR INTERVENTION;  Surgeon: [name], MD;  Location: [facility];  Service: Cardiovascular;  Laterality: Left;  SFA  . PERIPHERAL VASCULAR INTERVENTION Left 04/25/2019   Procedure: PERIPHERAL VASCULAR INTERVENTION;  Surgeon: [name], MD;  Location: [facility];  Service: Cardiovascular;  Laterality: Left;  right superficial femoral, and left iliac  . REDUCTION MAMMAPLASTY  2002  . TONSILLECTOMY    . TUBAL LIGATION  1996  . VAGINAL HYSTERECTOMY  08/04/2005   and cysto    Social History   Socioeconomic History  . Marital status: Single    Spouse name: Not on file  .  Number of children: 3  . Years of education: 74  . Highest education level: Not on file  Occupational History  . Occupation: Curator  Tobacco Use  . Smoking status: Former Smoker    Years: 20.00    Types: Cigarettes    Quit date: 11/2016    Years since quitting: 2.6  . Smokeless tobacco: Never Used  Vaping Use  . Vaping Use: Never used  Substance and Sexual Activity  . Alcohol use: No    Alcohol/week: 0.0 standard drinks  . Drug use: No  . Sexual activity: Not on file  Other Topics Concern  . Not on file  Social History Narrative   Lives with daughter in a 2 story home.  Has 3 children.  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  Education: college.    Social Determinants of Health   Financial Resource Strain:   . Difficulty of Paying Living Expenses:   Food Insecurity:   . Worried About Charity fundraiser in the Last Year:   . [place] in the  Last Year:   Transportation Needs:   . Film/video editor (Medical):   [name] Lack of Transportation (Non-Medical):   Physical Activity:   . Days of Exercise per Week:   . Minutes of Exercise per Session:   Stress:   . Feeling of Stress :   Social Connections:   . Frequency of Communication with Friends and Family:   . Frequency of Social Gatherings with Friends and Family:   . Attends Religious Services:   . Active Member of Clubs or Organizations:   . Attends Archivist Meetings:   [name] Marital Status:   Intimate Partner Violence:   . Fear of Current or Ex-Partner:   . Emotionally Abused:   [name] Physically Abused:   . Sexually Abused:    Family History  Problem Relation Age of Onset  . Allergic rhinitis Mother   . Hypertension Father   . Allergic rhinitis Father   . Hypertension Sister   . Allergic rhinitis Sister   . Hypertension Brother   . Allergic rhinitis Brother   . Breast cancer Cousin        x2    Current Outpatient Medications  Medication Sig Dispense Refill    . albuterol (PROVENTIL HFA;VENTOLIN HFA) 108 (90 BASE) MCG/ACT inhaler Inhale 1-2 puffs into the lungs every 6 (six) hours as needed for wheezing or shortness of breath.     . Alirocumab (PRALUENT) 75 MG/ML SOAJ Inject 75 mg into the skin every 14 (fourteen) days. 2 pen 11  . amitriptyline (ELAVIL) 10 MG tablet Take 1 tablet (10 mg total) by mouth at bedtime. (Patient not taking: Reported on 04/24/2019) 30 tablet 1  . amLODipine (NORVASC) 10 MG tablet Take by mouth.    [name] aspirin 81 MG chewable tablet Chew 1 tablet (81 mg total) by mouth daily. 30 tablet 0  . B Complex-C-Zn-Folic Acid (DIALYVITE 528 WITH ZINC) 0.8 MG TABS Take 1 tablet by mouth daily.    . calcitRIOL (ROCALTROL) 0.5 MCG capsule Take 1.5 mcg by mouth daily.    . calcium acetate (PHOSLO) 667 MG capsule Take 1,334-2,001 mg by mouth See admin instructions. Take 2001 mg by mouth up to three times daily with meals and 1334 mg with snacks  10  . clopidogrel (PLAVIX) 75 MG tablet Take 1 tablet (75 mg total) by mouth daily with breakfast. 90 tablet 3  . colchicine 0.6 MG tablet Take 1 tablet (0.6 mg total) by mouth daily. 30 tablet 0  . Fluticasone-Salmeterol (ADVAIR) 250-50 MCG/DOSE AEPB Inhale 1 puff into the lungs 2 (two) times daily. 60 each 3  . gentamicin cream (GARAMYCIN) 0.1 % Apply 1 application topically See admin instructions. Apply to access site nightly    . hydrALAZINE (APRESOLINE) 25 MG tablet Take 1 tablet (25 mg total) by mouth 3 (three) times daily. 270 tablet 3  . insulin aspart (NOVOLOG) 100 UNIT/ML injection 0-6 Units, Subcutaneous, 3 times daily with meals CBG < 70: Implement Hypoglycemia measures/call MD CBG 70 - 120: 0 units CBG 121 - 150: 0 units CBG 151 - 200: 1 unit CBG 201-250: 2 units CBG 251-300: 3 units CBG 301-350: 4 units CBG 351-400: 5 units CBG > 400: Give 6 units and call MD 10 mL 0  . insulin glargine, 2 Unit Dial, (TOUJEO MAX SOLOSTAR) 300 UNIT/ML Solostar Pen Inject 54 Units into the skin at bedtime.      . insulin regular (NOVOLIN R) 100 units/mL injection Inject 0-10 Units into the skin 3 (three)  times daily before meals.    . isosorbide mononitrate (IMDUR) 30 MG 24 hr tablet Take 0.5 tablets (15 mg total) by mouth daily. 45 tablet 3  . methocarbamol (ROBAXIN) 500 MG tablet Take 1 tablet (500 mg total) by mouth 2 (two) times daily as needed for muscle spasms. 60 tablet 1  . metoCLOPramide (REGLAN) 5 MG tablet TAKE 1 TABLET BY MOUTH 4 TIMES DAILY BEFORE MEALS AND AT BEDTIME. (Patient taking differently: Take 5 mg by mouth 4 (four) times daily -  before meals and at bedtime. ) 90 tablet 0  . metoprolol succinate (TOPROL-XL) 50 MG 24 hr tablet Take 75 mg (1.5 tablet) daily 135 tablet 3  . montelukast (SINGULAIR) 10 MG tablet Take 1 tablet (10 mg total) by mouth at bedtime. 30 tablet 5  . nitroGLYCERIN (NITROSTAT) 0.4 MG SL tablet Place 1 tablet (0.4 mg total) under the tongue every 5 (five) minutes as needed for chest pain. 25 tablet 2  . ondansetron (ZOFRAN ODT) 4 MG disintegrating tablet Take 1 tablet (4 mg total) by mouth every 8 (eight) hours as needed for nausea or vomiting. 20 tablet 0  . pantoprazole (PROTONIX) 40 MG tablet TAKE 1 TABLET BY MOUTH DAILY. (Patient taking differently: Take 40 mg by mouth daily. ) 30 tablet 7  . pregabalin (LYRICA) 75 MG capsule Take 1 capsule (75 mg total) by mouth daily. 30 capsule 1  . trimethoprim-polymyxin b (POLYTRIM) ophthalmic solution Place 1 drop into both eyes See admin instructions. Instill one drop into both eyes four times daily for 2 days after each monthly eye injection  12   No current facility-administered medications for this visit.    Allergies  Allergen Reactions  . Repatha [Evolocumab] Itching  . Other Itching  . Lisinopril Cough     REVIEW OF SYSTEMS:   [X]  denotes positive finding, [ ]  denotes negative finding Cardiac  Comments:  Chest pain or chest pressure:    Shortness of breath upon exertion:    Short of breath when lying  flat:    Irregular heart rhythm:        Vascular    Pain in calf, thigh, or hip brought on by ambulation:    Pain in feet at night that wakes you up from your sleep:     Blood clot in your veins:    Leg swelling:         Pulmonary    Oxygen at home:    Productive cough:     Wheezing:         Neurologic    Sudden weakness in arms or legs:     Sudden numbness in arms or legs:     Sudden onset of difficulty speaking or slurred speech:    Temporary loss of vision in one eye:     Problems with dizziness:         Gastrointestinal    Blood in stool:     Vomited blood:         Genitourinary    Burning when urinating:     Blood in urine:        Psychiatric    Major depression:         Hematologic    Bleeding problems:    Problems with blood clotting too easily:        Skin    Rashes or ulcers:        Constitutional    Fever or chills:  PHYSICAL EXAMINATION:  Vitals:   07/11/19 0848  BP: (!) 161/71  Pulse: 86  Resp: 20  Temp: (!) 97.5 F (36.4 C)  SpO2: 100%   General:  WDWN in NAD; vital signs documented above Gait: steady, no ataxia HENT: WNL, normocephalic Pulmonary: normal non-labored breathing , without Rales, rhonchi,  wheezing Cardiac: regular HR, with  Murmurs with carotid bruits Abdomen: soft, NT, no masses Skin: without rashes Vascular Exam/Pulses: 1+ left DP pulse, 2+ femoral pulses Extremities: without ischemic changes, without Gangrene , without cellulitis; without open wounds; Feet warm and well perfused with motor and sensation intact Musculoskeletal: no muscle wasting or atrophy  Neurologic: A&O X 3;  No focal weakness or paresthesias are detected Psychiatric:  The pt has Normal affect.      Non-Invasive Vascular Imaging:    Left Stent(s):  +---------------+---+---------------+--------++  Prox to Stent 178        biphasic  +---------------+---+---------------+--------++  Proximal Stent 159         biphasic  +---------------+---+---------------+--------++  Mid Stent   49850-99% stenosisbiphasic  +---------------+---+---------------+--------++  Distal Stent  285        biphasic  +---------------+---+---------------+--------++  Distal to Stent108        biphasic  +---------------+---+---------------+--------++  Summary:  Stenosis:  Patent left EIA stent with 50-99% stenosis mid stent.   ABI Findings:  +---------+------------------+-----+----------+--------+  Right  Rt Pressure (mmHg)IndexWaveform Comment   +---------+------------------+-----+----------+--------+  Brachial 176                      +---------+------------------+-----+----------+--------+  PTA   255        1.45 monophasic      +---------+------------------+-----+----------+--------+  DP    255        1.45 monophasic      +---------+------------------+-----+----------+--------+  Great Toe104        0.59            +---------+------------------+-----+----------+--------+   +---------+------------------+-----+----------+-------+  Left   Lt Pressure (mmHg)IndexWaveform Comment  +---------+------------------+-----+----------+-------+  Brachial 163                      +---------+------------------+-----+----------+-------+  PTA   163        0.93 monophasic      +---------+------------------+-----+----------+-------+  DP    143        0.81 monophasic      +---------+------------------+-----+----------+-------+  Great Toe111        0.63            +---------+------------------+-----+----------+-------+   +-------+-----------+-----------+------------+------------+  ABI/TBIToday's ABIToday's TBIPrevious ABIPrevious TBI  +-------+-----------+-----------+------------+------------+  Right [place]      0.59                  +-------+-----------+-----------+------------+------------+  Left  0.93    0.63                  +-------+-----------+-----------+------------+------------+  Summary:  Right: Resting right ankle-brachial index indicates noncompressible right  lower extremity arteries. The right toe-brachial index is abnormal. RT  great toe pressure = 104 mmHg.   Left: Resting left ankle-brachial index indicates mild left lower  extremity arterial disease. The left toe-brachial index is abnormal. LT  Great toe pressure = 111 mmHg.  Pedal pressures may be falsely elevated  ASSESSMENT/PLAN:: 49 y.o. female here for follow up for left internal iliac artery stent placement approximately 10 weeks ago.  Duplex study today reveals mid stent stenosis estimated at 50 to 99% stenosis.  Waveforms are  biphasic.  ABI 0.93.  She is asymptomatic.  Additionally, during her last visit her right SFA stent was noted to also have 50 to 99% stenosis in the SFA distally to the stent.  I will discuss her duplex study with Dr. Carlis Abbott and will make arrangements for 75-month follow-up with repeat duplex and ABIs.  The patient and I reviewed signs and symptoms of claudication and I informed her of the need to call our office if she re-develops pain with walking.  Addendum: I discussed the results with Dr. Carlis Abbott who reviewed today's assessment. Due to the elevated velocity of the left EIA stent, he recommends proceeding with arteriogram.  I telephoned the patient and informed her of this.  She is agreeable and we schedule this at next available date.  Barbie Banner, PA-C [facility] [phone]  Clinic MD: [name]

## 2019-07-12 ENCOUNTER — Other Ambulatory Visit: Payer: Self-pay

## 2019-07-12 DIAGNOSIS — D649 Anemia, unspecified: Secondary | ICD-10-CM | POA: Diagnosis not present

## 2019-07-12 DIAGNOSIS — D631 Anemia in chronic kidney disease: Secondary | ICD-10-CM | POA: Diagnosis not present

## 2019-07-12 DIAGNOSIS — Z992 Dependence on renal dialysis: Secondary | ICD-10-CM | POA: Diagnosis not present

## 2019-07-12 DIAGNOSIS — E1122 Type 2 diabetes mellitus with diabetic chronic kidney disease: Secondary | ICD-10-CM | POA: Diagnosis not present

## 2019-07-12 DIAGNOSIS — N186 End stage renal disease: Secondary | ICD-10-CM | POA: Diagnosis not present

## 2019-07-12 DIAGNOSIS — N2581 Secondary hyperparathyroidism of renal origin: Secondary | ICD-10-CM | POA: Diagnosis not present

## 2019-07-13 ENCOUNTER — Other Ambulatory Visit (HOSPITAL_COMMUNITY): Payer: Medicare Other

## 2019-07-13 DIAGNOSIS — N186 End stage renal disease: Secondary | ICD-10-CM | POA: Diagnosis not present

## 2019-07-13 DIAGNOSIS — D649 Anemia, unspecified: Secondary | ICD-10-CM | POA: Diagnosis not present

## 2019-07-13 DIAGNOSIS — D631 Anemia in chronic kidney disease: Secondary | ICD-10-CM | POA: Diagnosis not present

## 2019-07-13 DIAGNOSIS — N2581 Secondary hyperparathyroidism of renal origin: Secondary | ICD-10-CM | POA: Diagnosis not present

## 2019-07-13 DIAGNOSIS — Z992 Dependence on renal dialysis: Secondary | ICD-10-CM | POA: Diagnosis not present

## 2019-07-16 DIAGNOSIS — D631 Anemia in chronic kidney disease: Secondary | ICD-10-CM | POA: Diagnosis not present

## 2019-07-16 DIAGNOSIS — N2581 Secondary hyperparathyroidism of renal origin: Secondary | ICD-10-CM | POA: Diagnosis not present

## 2019-07-16 DIAGNOSIS — N186 End stage renal disease: Secondary | ICD-10-CM | POA: Diagnosis not present

## 2019-07-16 DIAGNOSIS — D649 Anemia, unspecified: Secondary | ICD-10-CM | POA: Diagnosis not present

## 2019-07-16 DIAGNOSIS — Z992 Dependence on renal dialysis: Secondary | ICD-10-CM | POA: Diagnosis not present

## 2019-07-17 ENCOUNTER — Other Ambulatory Visit: Payer: Self-pay | Admitting: *Deleted

## 2019-07-17 DIAGNOSIS — Z992 Dependence on renal dialysis: Secondary | ICD-10-CM | POA: Diagnosis not present

## 2019-07-17 DIAGNOSIS — N186 End stage renal disease: Secondary | ICD-10-CM | POA: Diagnosis not present

## 2019-07-17 DIAGNOSIS — M25569 Pain in unspecified knee: Secondary | ICD-10-CM

## 2019-07-17 DIAGNOSIS — D649 Anemia, unspecified: Secondary | ICD-10-CM | POA: Diagnosis not present

## 2019-07-17 DIAGNOSIS — D631 Anemia in chronic kidney disease: Secondary | ICD-10-CM | POA: Diagnosis not present

## 2019-07-17 DIAGNOSIS — N2581 Secondary hyperparathyroidism of renal origin: Secondary | ICD-10-CM | POA: Diagnosis not present

## 2019-07-17 DIAGNOSIS — I739 Peripheral vascular disease, unspecified: Secondary | ICD-10-CM

## 2019-07-18 ENCOUNTER — Encounter (HOSPITAL_COMMUNITY)
Admission: RE | Disposition: A | Discharge status: Home / Self Care | Payer: Self-pay | Source: Ambulatory Visit | Attending: Vascular Surgery

## 2019-07-18 ENCOUNTER — Other Ambulatory Visit: Payer: Self-pay

## 2019-07-18 ENCOUNTER — Ambulatory Visit (HOSPITAL_COMMUNITY)
Admission: RE | Admit: 2019-07-18 | Payer: Medicare Other | Source: Ambulatory Visit | Attending: Cardiovascular Disease | Admitting: Cardiovascular Disease

## 2019-07-18 ENCOUNTER — Ambulatory Visit (HOSPITAL_COMMUNITY)
Admission: RE | Admit: 2019-07-18 | Discharge: 2019-07-18 | Disposition: A | Discharge status: Home / Self Care | Payer: Medicare Other | Source: Ambulatory Visit | Attending: Vascular Surgery | Admitting: Vascular Surgery

## 2019-07-18 ENCOUNTER — Ambulatory Visit: Payer: Medicare Other | Admitting: Cardiovascular Disease

## 2019-07-18 DIAGNOSIS — I5032 Chronic diastolic (congestive) heart failure: Secondary | ICD-10-CM | POA: Diagnosis not present

## 2019-07-18 DIAGNOSIS — I2510 Atherosclerotic heart disease of native coronary artery without angina pectoris: Secondary | ICD-10-CM | POA: Diagnosis not present

## 2019-07-18 DIAGNOSIS — Z888 Allergy status to other drugs, medicaments and biological substances status: Secondary | ICD-10-CM | POA: Diagnosis not present

## 2019-07-18 DIAGNOSIS — Z7902 Long term (current) use of antithrombotics/antiplatelets: Secondary | ICD-10-CM | POA: Insufficient documentation

## 2019-07-18 DIAGNOSIS — E1136 Type 2 diabetes mellitus with diabetic cataract: Secondary | ICD-10-CM | POA: Insufficient documentation

## 2019-07-18 DIAGNOSIS — E1140 Type 2 diabetes mellitus with diabetic neuropathy, unspecified: Secondary | ICD-10-CM | POA: Diagnosis not present

## 2019-07-18 DIAGNOSIS — I130 Hypertensive heart and chronic kidney disease with heart failure and stage 1 through stage 4 chronic kidney disease, or unspecified chronic kidney disease: Secondary | ICD-10-CM | POA: Diagnosis not present

## 2019-07-18 DIAGNOSIS — Z87891 Personal history of nicotine dependence: Secondary | ICD-10-CM | POA: Diagnosis not present

## 2019-07-18 DIAGNOSIS — I70212 Atherosclerosis of native arteries of extremities with intermittent claudication, left leg: Secondary | ICD-10-CM | POA: Insufficient documentation

## 2019-07-18 DIAGNOSIS — J45990 Exercise induced bronchospasm: Secondary | ICD-10-CM | POA: Diagnosis not present

## 2019-07-18 DIAGNOSIS — I70222 Atherosclerosis of native arteries of extremities with rest pain, left leg: Secondary | ICD-10-CM

## 2019-07-18 DIAGNOSIS — Z794 Long term (current) use of insulin: Secondary | ICD-10-CM | POA: Diagnosis not present

## 2019-07-18 DIAGNOSIS — Z992 Dependence on renal dialysis: Secondary | ICD-10-CM | POA: Diagnosis not present

## 2019-07-18 DIAGNOSIS — E1151 Type 2 diabetes mellitus with diabetic peripheral angiopathy without gangrene: Secondary | ICD-10-CM | POA: Diagnosis not present

## 2019-07-18 DIAGNOSIS — Z79899 Other long term (current) drug therapy: Secondary | ICD-10-CM | POA: Insufficient documentation

## 2019-07-18 DIAGNOSIS — E1122 Type 2 diabetes mellitus with diabetic chronic kidney disease: Secondary | ICD-10-CM | POA: Insufficient documentation

## 2019-07-18 DIAGNOSIS — Z7982 Long term (current) use of aspirin: Secondary | ICD-10-CM | POA: Insufficient documentation

## 2019-07-18 DIAGNOSIS — N184 Chronic kidney disease, stage 4 (severe): Secondary | ICD-10-CM | POA: Insufficient documentation

## 2019-07-18 DIAGNOSIS — Z86718 Personal history of other venous thrombosis and embolism: Secondary | ICD-10-CM | POA: Insufficient documentation

## 2019-07-18 DIAGNOSIS — Z955 Presence of coronary angioplasty implant and graft: Secondary | ICD-10-CM | POA: Insufficient documentation

## 2019-07-18 DIAGNOSIS — Z7951 Long term (current) use of inhaled steroids: Secondary | ICD-10-CM | POA: Insufficient documentation

## 2019-07-18 DIAGNOSIS — K219 Gastro-esophageal reflux disease without esophagitis: Secondary | ICD-10-CM | POA: Diagnosis not present

## 2019-07-18 DIAGNOSIS — Z8249 Family history of ischemic heart disease and other diseases of the circulatory system: Secondary | ICD-10-CM | POA: Diagnosis not present

## 2019-07-18 HISTORY — PX: PERIPHERAL VASCULAR INTERVENTION: CATH118257

## 2019-07-18 HISTORY — PX: ABDOMINAL AORTOGRAM W/LOWER EXTREMITY: CATH118223

## 2019-07-18 LAB — POCT I-STAT, CHEM 8
BUN: 45 mg/dL — ABNORMAL HIGH (ref 6–20)
Calcium, Ion: 1.03 mmol/L — ABNORMAL LOW (ref 1.15–1.40)
Chloride: 100 mmol/L (ref 98–111)
Creatinine, Ser: 6.3 mg/dL — ABNORMAL HIGH (ref 0.44–1.00)
Glucose, Bld: 112 mg/dL — ABNORMAL HIGH (ref 70–99)
HCT: 50 % — ABNORMAL HIGH (ref 36.0–46.0)
Hemoglobin: 17 g/dL — ABNORMAL HIGH (ref 12.0–15.0)
Potassium: 3.8 mmol/L (ref 3.5–5.1)
Sodium: 139 mmol/L (ref 135–145)
TCO2: 30 mmol/L (ref 22–32)

## 2019-07-18 LAB — GLUCOSE, CAPILLARY: Glucose-Capillary: 110 mg/dL — ABNORMAL HIGH (ref 70–99)

## 2019-07-18 SURGERY — ABDOMINAL AORTOGRAM W/LOWER EXTREMITY
Anesthesia: LOCAL | Laterality: Left

## 2019-07-18 MED ORDER — SODIUM CHLORIDE 0.9% FLUSH: 3.0000 mL | INTRAVENOUS | Status: DC | PRN

## 2019-07-18 MED ORDER — FENTANYL CITRATE (PF) 100 MCG/2ML IJ SOLN
INTRAMUSCULAR | Status: DC | PRN
  Administered 2019-07-18: 25 ug via INTRAVENOUS

## 2019-07-18 MED ORDER — MIDAZOLAM HCL 2 MG/2ML IJ SOLN
INTRAMUSCULAR | Status: DC | PRN
  Administered 2019-07-18: 2 mg via INTRAVENOUS

## 2019-07-18 MED ORDER — LIDOCAINE HCL (PF) 1 % IJ SOLN
INTRAMUSCULAR | Status: AC
  Filled 2019-07-18: qty 30

## 2019-07-18 MED ORDER — SODIUM CHLORIDE 0.9% FLUSH: 3.0000 mL | Freq: Two times a day (BID) | INTRAVENOUS | Status: DC

## 2019-07-18 MED ORDER — MIDAZOLAM HCL 2 MG/2ML IJ SOLN
INTRAMUSCULAR | Status: AC
  Filled 2019-07-18: qty 2

## 2019-07-18 MED ORDER — HEPARIN SODIUM (PORCINE) 1000 UNIT/ML IJ SOLN
INTRAMUSCULAR | Status: DC | PRN
  Administered 2019-07-18: 7000 [IU] via INTRAVENOUS

## 2019-07-18 MED ORDER — HEPARIN (PORCINE) IN NACL 1000-0.9 UT/500ML-% IV SOLN
INTRAVENOUS | Status: AC
  Filled 2019-07-18: qty 500

## 2019-07-18 MED ORDER — CLOPIDOGREL BISULFATE 75 MG PO TABS
ORAL_TABLET | ORAL | Status: DC | PRN
  Administered 2019-07-18: 75 mg via ORAL

## 2019-07-18 MED ORDER — IODIXANOL 320 MG/ML IV SOLN
INTRAVENOUS | Status: DC | PRN
  Administered 2019-07-18: 75 mL via INTRA_ARTERIAL

## 2019-07-18 MED ORDER — HYDRALAZINE HCL 20 MG/ML IJ SOLN: 5.0000 mg | INTRAMUSCULAR | Status: DC | PRN

## 2019-07-18 MED ORDER — HEPARIN (PORCINE) IN NACL 1000-0.9 UT/500ML-% IV SOLN
INTRAVENOUS | Status: DC | PRN
  Administered 2019-07-18 (×2): 500 mL

## 2019-07-18 MED ORDER — CLOPIDOGREL BISULFATE 75 MG PO TABS
ORAL_TABLET | ORAL | Status: AC
  Filled 2019-07-18: qty 1

## 2019-07-18 MED ORDER — LIDOCAINE HCL (PF) 1 % IJ SOLN
INTRAMUSCULAR | Status: DC | PRN
  Administered 2019-07-18: 15 mL via INTRADERMAL

## 2019-07-18 MED ORDER — LABETALOL HCL 5 MG/ML IV SOLN: 10.0000 mg | INTRAVENOUS | Status: DC | PRN

## 2019-07-18 MED ORDER — ONDANSETRON HCL 4 MG/2ML IJ SOLN: 4.0000 mg | Freq: Four times a day (QID) | INTRAMUSCULAR | Status: DC | PRN

## 2019-07-18 MED ORDER — SODIUM CHLORIDE 0.9 % IV SOLN: 250.0000 mL | INTRAVENOUS | Status: DC | PRN

## 2019-07-18 MED ORDER — ACETAMINOPHEN 325 MG PO TABS: 650.0000 mg | ORAL_TABLET | ORAL | Status: DC | PRN

## 2019-07-18 MED ORDER — HEPARIN SODIUM (PORCINE) 1000 UNIT/ML IJ SOLN
INTRAMUSCULAR | Status: AC
  Filled 2019-07-18: qty 1

## 2019-07-18 MED ORDER — LABETALOL HCL 5 MG/ML IV SOLN
INTRAVENOUS | Status: AC
  Filled 2019-07-18: qty 4

## 2019-07-18 MED ORDER — LABETALOL HCL 5 MG/ML IV SOLN
INTRAVENOUS | Status: DC | PRN
  Administered 2019-07-18: 10 mg via INTRAVENOUS

## 2019-07-18 MED ORDER — FENTANYL CITRATE (PF) 100 MCG/2ML IJ SOLN
INTRAMUSCULAR | Status: AC
  Filled 2019-07-18: qty 2

## 2019-07-18 SURGICAL SUPPLY — 20 items
BALLN MUSTANG 6.0X40 75 (BALLOONS) ×2
BALLOON MUSTANG 6.0X40 75 (BALLOONS) IMPLANT
CATH CROSS OVER TEMPO 5F (CATHETERS) ×1 IMPLANT
CATH OMNI FLUSH 5F 65CM (CATHETERS) ×1 IMPLANT
CATH STRAIGHT 5FR 65CM (CATHETERS) ×1 IMPLANT
CATH TEMPO 5F RIM 65CM (CATHETERS) ×1 IMPLANT
DEVICE CLOSURE MYNXGRIP 6/7F (Vascular Products) ×1 IMPLANT
KIT ENCORE 26 ADVANTAGE (KITS) ×1 IMPLANT
KIT MICROPUNCTURE NIT STIFF (SHEATH) ×1 IMPLANT
KIT PV (KITS) ×2 IMPLANT
SHEATH FLEX ANSEL ST 6FR 45CM (SHEATH) ×1 IMPLANT
SHEATH PINNACLE 5F 10CM (SHEATH) ×1 IMPLANT
SHEATH PINNACLE 6F 10CM (SHEATH) ×1 IMPLANT
SHEATH PROBE COVER 6X72 (BAG) ×1 IMPLANT
STENT INNOVA 7X40X130 (Permanent Stent) ×1 IMPLANT
SYR MEDRAD MARK V 150ML (SYRINGE) ×1 IMPLANT
TRANSDUCER W/STOPCOCK (MISCELLANEOUS) ×2 IMPLANT
TRAY PV CATH (CUSTOM PROCEDURE TRAY) ×2 IMPLANT
WIRE ROSEN-J .035X260CM (WIRE) ×1 IMPLANT
WIRE STARTER BENTSON 035X150 (WIRE) ×1 IMPLANT

## 2019-07-18 NOTE — Op Note (Signed)
Patient name: Susan Horton MRN: [id number] DOB: 02-19-1970 Sex: female  07/18/2019 Pre-operative Diagnosis: Short distance lifestyle limiting claudication of the left lower extremity Post-operative diagnosis:  Same Surgeon:  [name], MD Procedure Performed: 1.  Ultrasound-guided access of the right common femoral artery 2.  Aortogram including catheter selection of the aorta 3.  Left lower extremity arteriogram with selection of third order branches 4.  Left external iliac artery angioplasty with stent placement (primarily stented with a 7 mm x 40 mm self-expanding Innova postdilated with a 6 mm balloon) 5.  47 minutes of monitored moderate conscious sedation time 6.  Mynx closure of the right common femoral artery  Indications: Patient is 49 year old female who is well-known to me and has undergone previous left external iliac and SFA angioplasty with stent placement for lifestyle limiting short distance claudication in the left lower extremity.  She presents today for evaluation of her left external iliac stent after surveillance that showed an elevated velocity of near 500 suggesting a high-grade stenosis.  Risks and benefits were discussed.  Findings: Aortogram showed that she has a small infrarenal aorta that is patent.  On the left her external iliac stent did not appear to have any flow-limiting stenosis and was widely patent.  She did have on oblique imaging a moderate 50% stenosis just distal to her external iliac stent and we subsequently extended and placed a second stent in the external iliac lesion distal to her existing stent.  Ultimately left common femoral, profunda, SFA, SFA stents are all patent and she has at least two-vessel runoff via anterior tibial and posterior tibial artery.   Procedure:  The patient was identified in the holding area and taken to room 8.  The patient was then placed supine on the table and prepped and draped in the usual sterile fashion.  A  time out was called.  Ultrasound was used to evaluate the right common femoral artery.  It was patent .  A digital ultrasound image was acquired.  A micropuncture needle was used to access the right common femoral artery under ultrasound guidance.  An 018 wire was advanced without resistance and a micropuncture sheath was placed.  The 018 wire was removed and a benson wire was placed.  The micropuncture sheath was exchanged for a 5 french sheath.  An omniflush catheter was advanced over the wire to the level of L-1.  An abdominal angiogram was obtained.  Then used a crossover catheter and ultimately selected the left iliac and got staged imaging of the left lower extremity given that initial imaging of the left external iliac stent looked okay.  I wanted to ensure there was no stenosis in the SFA stents and all of her left SFA stents were widely patent.  Ultimately on further oblique imaging of her iliacs she did appear to have about a 50% stenosis just distal to her existing iliac stent.  Given this finding and no other overt findings within the existing stent, we elected to just extend her existing left external iliac stent.  I then placed a long Rosen wire and patient was given 100 units/kg heparin after placing a 6 [place] Ansell sheath in the right groin over the aortic bifurcation.  I then placed a 7 mm x 40 mm Innova just distal to her existing stent and then carried the stent within her existing stent.  This was postdilated with a 6 mm balloon.  There was good wall apposition with no residual stenosis.  She has a good left femoral pulse.  Exchanged for a short 6 French sheath in the right groin all over wires and catheters were removed.  We did get an access shot that looked okay and a mynx closure device was deployed in the right common femoral artery.  Taken to recovery in stable condition.  Plan: Patient will continue aspirin and Plavix.  Will arrange follow-up in 1 month.   [name],  MD [facility] Office: [phone]

## 2019-07-18 NOTE — Discharge Instructions (Signed)
Angiogram, Care After This sheet gives you information about how to care for yourself after your procedure. Your health care provider may also give you more specific instructions. If you have problems or questions, contact your health care provider. What can I expect after the procedure? After the procedure, it is common to have bruising and tenderness at the catheter insertion area. Follow these instructions at home: Insertion site care  Follow instructions from your health care provider about how to take care of your insertion site. Make sure you: ? Wash your hands with soap and water before you change your bandage (dressing). If soap and water are not available, use hand sanitizer. ? Change your dressing as told by your health care provider. ? Leave stitches (sutures), skin glue, or adhesive strips in place. These skin closures may need to stay in place for 2 weeks or longer. If adhesive strip edges start to loosen and curl up, you may trim the loose edges. Do not remove adhesive strips completely unless your health care provider tells you to do that.  Do not take baths, swim, or use a hot tub until your health care provider approves.  You may shower 24-48 hours after the procedure or as told by your health care provider. ? Gently wash the site with plain soap and water. ? Pat the area dry with a clean towel. ? Do not rub the site. This may cause bleeding.  Do not apply powder or lotion to the site. Keep the site clean and dry.  Check your insertion site every day for signs of infection. Check for: ? Redness, swelling, or pain. ? Fluid or blood. ? Warmth. ? Pus or a bad smell. Activity  Rest as told by your health care provider, usually for 1-2 days.  Do not lift anything that is heavier than 10 lbs. (4.5 kg) or as told by your health care provider.  Do not drive for 24 hours if you were given a medicine to help you relax (sedative).  Do not drive or use heavy machinery while  taking prescription pain medicine. General instructions   Return to your normal activities as told by your health care provider, usually in about a week. Ask your health care provider what activities are safe for you.  If the catheter site starts bleeding, lie flat and put pressure on the site. If the bleeding does not stop, get help right away. This is a medical emergency.  Drink enough fluid to keep your urine clear or pale yellow. This helps flush the contrast dye from your body.  Take over-the-counter and prescription medicines only as told by your health care provider.  Keep all follow-up visits as told by your health care provider. This is important. Contact a health care provider if:  You have a fever or chills.  You have redness, swelling, or pain around your insertion site.  You have fluid or blood coming from your insertion site.  The insertion site feels warm to the touch.  You have pus or a bad smell coming from your insertion site.  You have bruising around the insertion site.  You notice blood collecting in the tissue around the catheter site (hematoma). The hematoma may be painful to the touch. Get help right away if:  You have severe pain at the catheter insertion area.  The catheter insertion area swells very fast.  The catheter insertion area is bleeding, and the bleeding does not stop when you hold steady pressure on the area.    The area near or just beyond the catheter insertion site becomes pale, cool, tingly, or numb. These symptoms may represent a serious problem that is an emergency. Do not wait to see if the symptoms will go away. Get medical help right away. Call your local emergency services (911 in the [place] Do not drive yourself to the hospital. Summary  After the procedure, it is common to have bruising and tenderness at the catheter insertion area.  After the procedure, it is important to rest and drink plenty of fluids.  Do not take baths,  swim, or use a hot tub until your health care provider says it is okay to do so. You may shower 24-48 hours after the procedure or as told by your health care provider.  If the catheter site starts bleeding, lie flat and put pressure on the site. If the bleeding does not stop, get help right away. This is a medical emergency. This information is not intended to replace advice given to you by your health care provider. Make sure you discuss any questions you have with your health care provider. Document Revised: 12/10/2016 Document Reviewed: 12/03/2015 Elsevier Patient Education  2020 Elsevier Inc.  

## 2019-07-18 NOTE — H&P (Signed)
History and Physical Interval Note:  07/18/2019 11:58 AM  Susan Horton  has presented today for surgery, with the diagnosis of PVD.  The various methods of treatment have been discussed with the patient and family. After consideration of risks, benefits and other options for treatment, the patient has consented to  Procedure(s): ABDOMINAL AORTOGRAM W/LOWER EXTREMITY (Left) as a surgical intervention.  The patient's history has been reviewed, patient examined, no change in status, stable for surgery.  I have reviewed the patient's chart and labs.  Questions were answered to the patient's satisfaction.    Left external iliac mid stent stenosis on surveillance.  [name]  CC:  follow up of left EIA stent Requesting Provider:  Charlott Rakes, MD  HPI: Susan Horton is a 49 y.o. (Sep 08, 1970) female who presents for 6 weeks follow-up after undergoing right lower extremity aortogram with intervention on April 25, 2019.    She just got back from a vacation at the beach and was able to walk [place] without her cane or claudication. No rest pain or nonhealing ulcers/skin lesions.  She underwent right SFA angioplasty with stent placement as well as left external iliac angioplasty with stent placement by Dr. Carlis Abbott for lifestyle limiting claudication.  At her last visit her symptoms had improved.  Her left ABI and TBI had both increased.  Duplex study showed a 50 to 99% stenosis just distal to her recently placed SFA stent.  "At the end of the case we did stent the left external iliac high-grade stenosis given that we previously intervened on the left side and she has left SFA stents and thislesioncould threaten long-term patency of her stents."  Previous angioplasty and stent placement to left SFA on December 14, 2018 by Dr. Carlis Abbott for lifestyle limiting short distance claudication.  Past medical history and medications reviewed.  She is compliant with aspirin, Plavix. Now on  monoclonal antibody therapy for LDL cholesterol. Continues hemodialysis via LUE AVF. No recent A1c in chart. She thinks her last value was between 6-7%.  Is followed by Dr. Alvester Chou for bilateral carotid artery bruits and chronic diastolic heart failure Had recent 2D echo.  Normal LV systolic function.  Grade 2 diastolic dysfunction. Stop smoking in 2018     Past Medical History:  Diagnosis Date  . Abnormal stress test   . Anemia   . Angio-edema   . Asthma   . CAD (coronary artery disease)    DES to mid LAD July 2018, residual moderate RCA disease  . Cataract   . CKD (chronic kidney disease) stage 4, GFR 15-29 ml/min (HCC)    Dialysis T/Th/Sa  . DVT (deep venous thrombosis) ([place])    1996 during pregnancy, 2015 left leg  . Eczema   . Essential hypertension 12/11/2015  . Gastroparesis   . GERD (gastroesophageal reflux disease)   . Gluteal abscess 12/27/2018  . Hidradenitis   . Migraine   . Neuropathy   . Peripheral vascular disease ([place])    blood clot in leg  . Progressive angina ([place]) 06/05/2014   Chest pain  . Type 2 diabetes mellitus ([place])   . Type II diabetes mellitus ([place])   . Urticaria          Past Surgical History:  Procedure Laterality Date  . A/V FISTULAGRAM N/A 06/22/2017   Procedure: A/V FISTULAGRAM - left arm;  Surgeon: [name], MD;  Location: [facility];  Service: Cardiovascular;  Laterality: N/A;  . ABDOMINAL AORTOGRAM  W/LOWER EXTREMITY Bilateral 12/14/2018   Procedure: ABDOMINAL AORTOGRAM W/LOWER EXTREMITY;  Surgeon: [name], MD;  Location: [facility];  Service: Cardiovascular;  Laterality: Bilateral;  . ABDOMINAL AORTOGRAM W/LOWER EXTREMITY N/A 04/25/2019   Procedure: ABDOMINAL AORTOGRAM W/LOWER EXTREMITY;  Surgeon: [name], MD;  Location: [facility];  Service: Cardiovascular;  Laterality: N/A;  . ABDOMINOPLASTY    . ADENOIDECTOMY    . APPENDECTOMY  1995  . AV  FISTULA PLACEMENT Left 02/01/2017   Procedure: ARTERIOVENOUS BRACHIOCEPHALIC (AV) FISTULA CREATION;  Surgeon: [name], MD;  Location: [facility];  Service: Vascular;  Laterality: Left;  . CHOLECYSTECTOMY  1993  . CORONARY ANGIOPLASTY WITH STENT PLACEMENT    . CORONARY STENT INTERVENTION N/A 08/05/2016   Procedure: Coronary Stent Intervention;  Surgeon: [name], MD;  Location: [facility];  Service: Cardiovascular;  Laterality: N/A;  . EXPLORATORY LAPAROTOMY  08/14/2005   lysis of adhesions, drainage of tubo-ovarian abscess  . FISTULA SUPERFICIALIZATION Left 09/14/2017   Procedure: FISTULA SUPERFICIALIZATION ARTERIOVENOUS FISTULA LEFT ARM;  Surgeon: [name], MD;  Location: [facility];  Service: Vascular;  Laterality: Left;  . FLEXIBLE SIGMOIDOSCOPY N/A 01/04/2019   Procedure: FLEXIBLE SIGMOIDOSCOPY;  Surgeon: Clarene Essex, MD;  Location: [facility];  Service: Endoscopy;  Laterality: N/A;  . HYDRADENITIS EXCISION  02/08/2011   Procedure: EXCISION HYDRADENITIS GROIN;  Surgeon: [name], MD;  Location: [facility];  Service: General;  Laterality: N/A;  Excisioin of Hidradenitis Left groin  . INCISION AND DRAINAGE ABSCESS N/A 01/11/2019   Procedure: INCISION AND DRAINAGE SACRAL ABSCESS;  Surgeon: Georganna Skeans, MD;  Location: [facility];  Service: General;  Laterality: N/A;  . INGUINAL HIDRADENITIS EXCISION  07/06/2010   bilateral  . INSERTION OF DIALYSIS CATHETER N/A 09/14/2017   Procedure: INSERTION OF TUNNELED DIALYSIS CATHETER Right Internal Jugular;  Surgeon: [name], MD;  Location: [facility];  Service: Vascular;  Laterality: N/A;  . IR FLUORO GUIDE CV LINE RIGHT  03/01/2019  . IR US GUIDE BX ASP/DRAIN  01/01/2019  . IR US GUIDE VASC ACCESS RIGHT  03/01/2019  . LEFT HEART CATH AND CORONARY ANGIOGRAPHY N/A 08/05/2016   Procedure: Left Heart Cath and Coronary Angiography;  Surgeon: [name], MD;  Location: [facility];  Service: Cardiovascular;  Laterality: N/A;  . PERIPHERAL VASCULAR INTERVENTION Left 12/14/2018   Procedure: PERIPHERAL VASCULAR INTERVENTION;  Surgeon: [name], MD;  Location: [facility];  Service: Cardiovascular;  Laterality: Left;  SFA  . PERIPHERAL VASCULAR INTERVENTION Left 04/25/2019   Procedure: PERIPHERAL VASCULAR INTERVENTION;  Surgeon: [name], MD;  Location: [facility];  Service: Cardiovascular;  Laterality: Left;  right superficial femoral, and left iliac  . REDUCTION MAMMAPLASTY  2002  . TONSILLECTOMY    . TUBAL LIGATION  1996  . VAGINAL HYSTERECTOMY  08/04/2005   and cysto    Social History        Socioeconomic History  . Marital status: Single    Spouse name: Not on file  . Number of children: 3  . Years of education: 79  . Highest education level: Not on file  Occupational History  . Occupation: Curator  Tobacco Use  . Smoking status: Former Smoker    Years: 20.00    Types: Cigarettes    Quit date: 11/2016    Years since quitting: 2.6  . Smokeless tobacco: Never Used  Vaping Use  .  Vaping Use: Never used  Substance and Sexual Activity  . Alcohol use: No    Alcohol/week: 0.0 standard drinks  . Drug use: No  . Sexual activity: Not on file  Other Topics Concern  . Not on file  Social History Narrative   Lives with daughter in a 2 story home.  Has 3 children.  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  Education: college.    Social Determinants of Health      Financial Resource Strain:   . Difficulty of Paying Living Expenses:   Food Insecurity:   . Worried About Charity fundraiser in the Last Year:   . Arboriculturist in the Last Year:   Transportation Needs:   . Film/video editor (Medical):   [name] Lack of Transportation (Non-Medical):   Physical Activity:   . Days of Exercise per Week:   . Minutes of Exercise per Session:   Stress:   .  Feeling of Stress :   Social Connections:   . Frequency of Communication with Friends and Family:   . Frequency of Social Gatherings with Friends and Family:   . Attends Religious Services:   . Active Member of Clubs or Organizations:   . Attends Archivist Meetings:   [name] Marital Status:   Intimate Partner Violence:   . Fear of Current or Ex-Partner:   . Emotionally Abused:   [name] Physically Abused:   . Sexually Abused:         Family History  Problem Relation Age of Onset  . Allergic rhinitis Mother   . Hypertension Father   . Allergic rhinitis Father   . Hypertension Sister   . Allergic rhinitis Sister   . Hypertension Brother   . Allergic rhinitis Brother   . Breast cancer Cousin        x2          Current Outpatient Medications  Medication Sig Dispense Refill  . albuterol (PROVENTIL HFA;VENTOLIN HFA) 108 (90 BASE) MCG/ACT inhaler Inhale 1-2 puffs into the lungs every 6 (six) hours as needed for wheezing or shortness of breath.     . Alirocumab (PRALUENT) 75 MG/ML SOAJ Inject 75 mg into the skin every 14 (fourteen) days. 2 pen 11  . amitriptyline (ELAVIL) 10 MG tablet Take 1 tablet (10 mg total) by mouth at bedtime. (Patient not taking: Reported on 04/24/2019) 30 tablet 1  . amLODipine (NORVASC) 10 MG tablet Take by mouth.    [name] aspirin 81 MG chewable tablet Chew 1 tablet (81 mg total) by mouth daily. 30 tablet 0  . B Complex-C-Zn-Folic Acid (DIALYVITE 831 WITH ZINC) 0.8 MG TABS Take 1 tablet by mouth daily.    . calcitRIOL (ROCALTROL) 0.5 MCG capsule Take 1.5 mcg by mouth daily.    . calcium acetate (PHOSLO) 667 MG capsule Take 1,334-2,001 mg by mouth See admin instructions. Take 2001 mg by mouth up to three times daily with meals and 1334 mg with snacks  10  . clopidogrel (PLAVIX) 75 MG tablet Take 1 tablet (75 mg total) by mouth daily with breakfast. 90 tablet 3  . colchicine 0.6 MG tablet Take 1 tablet (0.6 mg total) by mouth daily. 30 tablet 0    . Fluticasone-Salmeterol (ADVAIR) 250-50 MCG/DOSE AEPB Inhale 1 puff into the lungs 2 (two) times daily. 60 each 3  . gentamicin cream (GARAMYCIN) 0.1 % Apply 1 application topically See admin instructions. Apply to access site nightly    .  hydrALAZINE (APRESOLINE) 25 MG tablet Take 1 tablet (25 mg total) by mouth 3 (three) times daily. 270 tablet 3  . insulin aspart (NOVOLOG) 100 UNIT/ML injection 0-6 Units, Subcutaneous, 3 times daily with meals CBG < 70: Implement Hypoglycemia measures/call MD CBG 70 - 120: 0 units CBG 121 - 150: 0 units CBG 151 - 200: 1 unit CBG 201-250: 2 units CBG 251-300: 3 units CBG 301-350: 4 units CBG 351-400: 5 units CBG > 400: Give 6 units and call MD 10 mL 0  . insulin glargine, 2 Unit Dial, (TOUJEO MAX SOLOSTAR) 300 UNIT/ML Solostar Pen Inject 54 Units into the skin at bedtime.     . insulin regular (NOVOLIN R) 100 units/mL injection Inject 0-10 Units into the skin 3 (three) times daily before meals.    . isosorbide mononitrate (IMDUR) 30 MG 24 hr tablet Take 0.5 tablets (15 mg total) by mouth daily. 45 tablet 3  . methocarbamol (ROBAXIN) 500 MG tablet Take 1 tablet (500 mg total) by mouth 2 (two) times daily as needed for muscle spasms. 60 tablet 1  . metoCLOPramide (REGLAN) 5 MG tablet TAKE 1 TABLET BY MOUTH 4 TIMES DAILY BEFORE MEALS AND AT BEDTIME. (Patient taking differently: Take 5 mg by mouth 4 (four) times daily -  before meals and at bedtime. ) 90 tablet 0  . metoprolol succinate (TOPROL-XL) 50 MG 24 hr tablet Take 75 mg (1.5 tablet) daily 135 tablet 3  . montelukast (SINGULAIR) 10 MG tablet Take 1 tablet (10 mg total) by mouth at bedtime. 30 tablet 5  . nitroGLYCERIN (NITROSTAT) 0.4 MG SL tablet Place 1 tablet (0.4 mg total) under the tongue every 5 (five) minutes as needed for chest pain. 25 tablet 2  . ondansetron (ZOFRAN ODT) 4 MG disintegrating tablet Take 1 tablet (4 mg total) by mouth every 8 (eight) hours as needed for nausea or vomiting. 20 tablet  0  . pantoprazole (PROTONIX) 40 MG tablet TAKE 1 TABLET BY MOUTH DAILY. (Patient taking differently: Take 40 mg by mouth daily. ) 30 tablet 7  . pregabalin (LYRICA) 75 MG capsule Take 1 capsule (75 mg total) by mouth daily. 30 capsule 1  . trimethoprim-polymyxin b (POLYTRIM) ophthalmic solution Place 1 drop into both eyes See admin instructions. Instill one drop into both eyes four times daily for 2 days after each monthly eye injection  12   No current facility-administered medications for this visit.        Allergies  Allergen Reactions  . Repatha [Evolocumab] Itching  . Other Itching  . Lisinopril Cough     REVIEW OF SYSTEMS:   [X]  denotes positive finding, [ ]  denotes negative finding Cardiac  Comments:  Chest pain or chest pressure:    Shortness of breath upon exertion:    Short of breath when lying flat:    Irregular heart rhythm:        Vascular    Pain in calf, thigh, or hip brought on by ambulation:    Pain in feet at night that wakes you up from your sleep:     Blood clot in your veins:    Leg swelling:         Pulmonary    Oxygen at home:    Productive cough:     Wheezing:         Neurologic    Sudden weakness in arms or legs:     Sudden numbness in arms or legs:     Sudden onset  of difficulty speaking or slurred speech:    Temporary loss of vision in one eye:     Problems with dizziness:         Gastrointestinal    Blood in stool:     Vomited blood:         Genitourinary    Burning when urinating:     Blood in urine:        Psychiatric    Major depression:         Hematologic    Bleeding problems:    Problems with blood clotting too easily:        Skin    Rashes or ulcers:        Constitutional    Fever or chills:      PHYSICAL EXAMINATION:     Vitals:   07/11/19 0848  BP: (!) 161/71  Pulse: 86  Resp: 20  Temp: (!) 97.5 F  (36.4 C)  SpO2: 100%   General:  WDWN in NAD; vital signs documented above Gait: steady, no ataxia HENT: WNL, normocephalic Pulmonary: normal non-labored breathing , without Rales, rhonchi,  wheezing Cardiac: regular HR, with  Murmurs with carotid bruits Abdomen: soft, NT, no masses Skin: without rashes Vascular Exam/Pulses: 1+ left DP pulse, 2+ femoral pulses Extremities: without ischemic changes, without Gangrene , without cellulitis; without open wounds; Feet warm and well perfused with motor and sensation intact Musculoskeletal: no muscle wasting or atrophy       Neurologic: A&O X 3;  No focal weakness or paresthesias are detected Psychiatric:  The pt has Normal affect.      Non-Invasive Vascular Imaging:    Left Stent(s):  +---------------+---+---------------+--------++  Prox to Stent 178        biphasic  +---------------+---+---------------+--------++  Proximal Stent 159        biphasic  +---------------+---+---------------+--------++  Mid Stent   49850-99% stenosisbiphasic  +---------------+---+---------------+--------++  Distal Stent  285        biphasic  +---------------+---+---------------+--------++  Distal to Stent108        biphasic  +---------------+---+---------------+--------++  Summary:  Stenosis:  Patent left EIA stent with 50-99% stenosis mid stent.   ABI Findings:  +---------+------------------+-----+----------+--------+  Right  Rt Pressure (mmHg)IndexWaveform Comment   +---------+------------------+-----+----------+--------+  Brachial 176                      +---------+------------------+-----+----------+--------+  PTA   255        1.45 monophasic      +---------+------------------+-----+----------+--------+  DP    255        1.45 monophasic      +---------+------------------+-----+----------+--------+    Great Toe104        0.59            +---------+------------------+-----+----------+--------+   +---------+------------------+-----+----------+-------+  Left   Lt Pressure (mmHg)IndexWaveform Comment  +---------+------------------+-----+----------+-------+  Brachial 163                      +---------+------------------+-----+----------+-------+  PTA   163        0.93 monophasic      +---------+------------------+-----+----------+-------+  DP    143        0.81 monophasic      +---------+------------------+-----+----------+-------+  Great Toe111        0.63            +---------+------------------+-----+----------+-------+   +-------+-----------+-----------+------------+------------+  ABI/TBIToday's ABIToday's TBIPrevious ABIPrevious TBI  +-------+-----------+-----------+------------+------------+  Right Carbon     0.59                  +-------+-----------+-----------+------------+------------+  Left  0.93    0.63                  +-------+-----------+-----------+------------+------------+  Summary:  Right: Resting right ankle-brachial index indicates noncompressible right  lower extremity arteries. The right toe-brachial index is abnormal. RT  great toe pressure = 104 mmHg.   Left: Resting left ankle-brachial index indicates mild left lower  extremity arterial disease. The left toe-brachial index is abnormal. LT  Great toe pressure = 111 mmHg.  Pedal pressures may be falsely elevated  ASSESSMENT/PLAN:: 49 y.o. female here for follow up for left internal iliac artery stent placement approximately 10 weeks ago.  Duplex study today reveals mid stent stenosis estimated at 50 to 99% stenosis.  Waveforms are biphasic.  ABI 0.93.  She is asymptomatic.  Additionally, during her last visit her right SFA stent was noted to also  have 50 to 99% stenosis in the SFA distally to the stent.  I will discuss her duplex study with Dr. Carlis Abbott and will make arrangements for 22-month follow-up with repeat duplex and ABIs.  The patient and I reviewed signs and symptoms of claudication and I informed her of the need to call our office if she re-develops pain with walking.  Addendum: I discussed the results with Dr. Carlis Abbott who reviewed today's assessment. Due to the elevated velocity of the left EIA stent, he recommends proceeding with arteriogram.  I telephoned the patient and informed her of this.  She is agreeable and we schedule this at next available date.  Barbie Banner, PA-C [facility] [phone]  Clinic MD: [name]

## 2019-07-19 ENCOUNTER — Encounter (HOSPITAL_COMMUNITY): Payer: Self-pay | Admitting: Vascular Surgery

## 2019-07-19 DIAGNOSIS — Z992 Dependence on renal dialysis: Secondary | ICD-10-CM | POA: Diagnosis not present

## 2019-07-19 DIAGNOSIS — N2581 Secondary hyperparathyroidism of renal origin: Secondary | ICD-10-CM | POA: Diagnosis not present

## 2019-07-19 DIAGNOSIS — N186 End stage renal disease: Secondary | ICD-10-CM | POA: Diagnosis not present

## 2019-07-19 DIAGNOSIS — D631 Anemia in chronic kidney disease: Secondary | ICD-10-CM | POA: Diagnosis not present

## 2019-07-19 DIAGNOSIS — D649 Anemia, unspecified: Secondary | ICD-10-CM | POA: Diagnosis not present

## 2019-07-20 DIAGNOSIS — Z992 Dependence on renal dialysis: Secondary | ICD-10-CM | POA: Diagnosis not present

## 2019-07-20 DIAGNOSIS — D649 Anemia, unspecified: Secondary | ICD-10-CM | POA: Diagnosis not present

## 2019-07-20 DIAGNOSIS — N186 End stage renal disease: Secondary | ICD-10-CM | POA: Diagnosis not present

## 2019-07-20 DIAGNOSIS — N2581 Secondary hyperparathyroidism of renal origin: Secondary | ICD-10-CM | POA: Diagnosis not present

## 2019-07-20 DIAGNOSIS — D631 Anemia in chronic kidney disease: Secondary | ICD-10-CM | POA: Diagnosis not present

## 2019-07-21 NOTE — Progress Notes (Signed)
Cardiology Office Note   Date:  07/23/2019   ID:  [name], DOB Sep 06, 1970, MRN [id number]  PCP:  Charlott Rakes, MD  Cardiologist:  Avelina Laine  CC: Follow Up   History of Present Illness: PRIYA MATSEN is a 49 y.o. female who presents for ongoing assessment and management of peripheral arterial disease.  Dr. Gwenlyn Found completed an outpatient radial diagnostic coronary angiography using limited contrast in the setting of chronic kidney disease stage III.  This was performed on 08/05/2016 revealing 80% mid LAD, which was stented using Synergy drug-eluting stent reducing 80% stenosis to 0% residual.  She also had moderate mid RCA stenosis which was not addressed, and left main spasm related to guide catheter during procedure which responded to intracoronary nitroglycerin.  She also is being followed for hypertension hyperlipidemia and seen by PCP for diabetes, as well as by nephrology for chronic kidney disease stage III.  She had carotid Dopplers performed because of carotid artery bruits which found mild to moderate left ICA stenosis which should be followed on an annual basis.  She underwent right SFA angioplasty with stent placement as well as left external iliac angioplasty with stent placement by Dr. Carlis Abbott for lifestyle limiting claudication. At her last visit her symptoms had improved. Her left ABI and TBI had both increased. Duplex study showed a 50 to 99% stenosis just distal to her recently placed SFA stent.  On 07/18/2018 the patient had an aortogram of the left lower extremity with selection of third order branches by Dr. Monica Martinez,. (Prior left external iliac artery stent placement with a 7 mm x 40 mm self-expanding Innova, postdilated with a 6 mm balloon).  This revealed no flow-limiting stenosis of her left external iliac stent.  However she did have an oblique imaging of moderate 50% stenosis just distal to her external iliac stent which was subsequently extended and a second  stent in the external iliac lesion distal to her existing stent was placed.  Left common femoral profunda FSA FSA stents were found to be patent and she had at least two-vessel runoff via anterior tibial and posterior tibial artery.   She comes today without any complaints.  She has undergone dialysis today.  She is tolerating it well but does not like the intrusiveness of having to go 4 days a week.  She is medically compliant.  She denies chest pain, dizziness, dyspnea, or pain at AV fistula site.  She denies any claudication symptoms.  Abdominal pain still. Past Medical History:  Diagnosis Date  . Abnormal stress test   . Anemia   . Angio-edema   . Asthma   . CAD (coronary artery disease)    DES to mid LAD July 2018, residual moderate RCA disease  . Cataract   . CKD (chronic kidney disease) stage 4, GFR 15-29 ml/min (HCC)    Dialysis T/Th/Sa  . DVT (deep venous thrombosis) ([place])    1996 during pregnancy, 2015 left leg  . Eczema   . Essential hypertension 12/11/2015  . Gastroparesis   . GERD (gastroesophageal reflux disease)   . Gluteal abscess 12/27/2018  . Hidradenitis   . Migraine   . Neuropathy   . Peripheral vascular disease ([place])    blood clot in leg  . Progressive angina ([place]) 06/05/2014   Chest pain  . Type 2 diabetes mellitus ([place])   . Type II diabetes mellitus ([place])   . Urticaria     Past Surgical History:  Procedure Laterality Date  . A/V  FISTULAGRAM N/A 06/22/2017   Procedure: A/V FISTULAGRAM - left arm;  Surgeon: [name], MD;  Location: [facility];  Service: Cardiovascular;  Laterality: N/A;  . ABDOMINAL AORTOGRAM W/LOWER EXTREMITY Bilateral 12/14/2018   Procedure: ABDOMINAL AORTOGRAM W/LOWER EXTREMITY;  Surgeon: [name], MD;  Location: [facility];  Service: Cardiovascular;  Laterality: Bilateral;  . ABDOMINAL AORTOGRAM W/LOWER EXTREMITY N/A 04/25/2019   Procedure: ABDOMINAL AORTOGRAM W/LOWER EXTREMITY;  Surgeon:  [name], MD;  Location: [facility];  Service: Cardiovascular;  Laterality: N/A;  . ABDOMINAL AORTOGRAM W/LOWER EXTREMITY Left 07/18/2019   Procedure: ABDOMINAL AORTOGRAM W/LOWER EXTREMITY;  Surgeon: [name], MD;  Location: [facility];  Service: Cardiovascular;  Laterality: Left;  . ABDOMINOPLASTY    . ADENOIDECTOMY    . APPENDECTOMY  1995  . AV FISTULA PLACEMENT Left 02/01/2017   Procedure: ARTERIOVENOUS BRACHIOCEPHALIC (AV) FISTULA CREATION;  Surgeon: [name], MD;  Location: [facility];  Service: Vascular;  Laterality: Left;  . CHOLECYSTECTOMY  1993  . CORONARY ANGIOPLASTY WITH STENT PLACEMENT    . CORONARY STENT INTERVENTION N/A 08/05/2016   Procedure: Coronary Stent Intervention;  Surgeon: [name], MD;  Location: [facility];  Service: Cardiovascular;  Laterality: N/A;  . EXPLORATORY LAPAROTOMY  08/14/2005   lysis of adhesions, drainage of tubo-ovarian abscess  . FISTULA SUPERFICIALIZATION Left 09/14/2017   Procedure: FISTULA SUPERFICIALIZATION ARTERIOVENOUS FISTULA LEFT ARM;  Surgeon: [name], MD;  Location: [facility];  Service: Vascular;  Laterality: Left;  . FLEXIBLE SIGMOIDOSCOPY N/A 01/04/2019   Procedure: FLEXIBLE SIGMOIDOSCOPY;  Surgeon: Clarene Essex, MD;  Location: [facility];  Service: Endoscopy;  Laterality: N/A;  . HYDRADENITIS EXCISION  02/08/2011   Procedure: EXCISION HYDRADENITIS GROIN;  Surgeon: [name], MD;  Location: [place];  Service: General;  Laterality: N/A;  Excisioin of Hidradenitis Left groin  . INCISION AND DRAINAGE ABSCESS N/A 01/11/2019   Procedure: INCISION AND DRAINAGE SACRAL ABSCESS;  Surgeon: Georganna Skeans, MD;  Location: [facility];  Service: General;  Laterality: N/A;  . INGUINAL HIDRADENITIS EXCISION  07/06/2010   bilateral  . INSERTION OF DIALYSIS CATHETER N/A 09/14/2017   Procedure: INSERTION OF TUNNELED DIALYSIS CATHETER Right Internal Jugular;  Surgeon: [name], MD;  Location: [facility];  Service: Vascular;  Laterality: N/A;  . IR FLUORO GUIDE CV LINE RIGHT  03/01/2019  . IR US GUIDE BX ASP/DRAIN  01/01/2019  . IR US GUIDE VASC ACCESS RIGHT  03/01/2019  . LEFT HEART CATH AND CORONARY ANGIOGRAPHY N/A 08/05/2016   Procedure: Left Heart Cath and Coronary Angiography;  Surgeon: [name], MD;  Location: [facility];  Service: Cardiovascular;  Laterality: N/A;  . PERIPHERAL VASCULAR INTERVENTION Left 12/14/2018   Procedure: PERIPHERAL VASCULAR INTERVENTION;  Surgeon: [name], MD;  Location: [facility];  Service: Cardiovascular;  Laterality: Left;  SFA  . PERIPHERAL VASCULAR INTERVENTION Left 04/25/2019   Procedure: PERIPHERAL VASCULAR INTERVENTION;  Surgeon: [name], MD;  Location: [facility];  Service: Cardiovascular;  Laterality: Left;  right superficial femoral, and left iliac  . PERIPHERAL VASCULAR INTERVENTION Left 07/18/2019   Procedure: PERIPHERAL VASCULAR INTERVENTION;  Surgeon: [name], MD;  Location: [facility];  Service: Cardiovascular;  Laterality: Left;  external iliac  . REDUCTION MAMMAPLASTY  2002  . TONSILLECTOMY    . TUBAL LIGATION  1996  . VAGINAL HYSTERECTOMY  08/04/2005  and cysto     Current Outpatient Medications  Medication Sig Dispense Refill  . albuterol (PROVENTIL HFA;VENTOLIN HFA) 108 (90 BASE) MCG/ACT inhaler Inhale 1-2 puffs into the lungs every 6 (six) hours as needed for wheezing or shortness of breath.     . Alirocumab (PRALUENT) 75 MG/ML SOAJ Inject 75 mg into the skin every 14 (fourteen) days. 2 pen 11  . amitriptyline (ELAVIL) 10 MG tablet Take 1 tablet (10 mg total) by mouth at bedtime. 30 tablet 1  . amLODipine (NORVASC) 10 MG tablet Take by mouth.    [name] aspirin 81 MG chewable tablet Chew 1 tablet (81 mg total) by mouth daily. 30 tablet 0  . B Complex-C-Zn-Folic Acid (DIALYVITE 546 WITH ZINC) 0.8 MG TABS Take 1 tablet by mouth daily.      . calcitRIOL (ROCALTROL) 0.5 MCG capsule Take 1.5 mcg by mouth daily.    . calcium acetate (PHOSLO) 667 MG capsule Take 1,334-2,001 mg by mouth See admin instructions. Take 2001 mg by mouth up to three times daily with meals and 1334 mg with snacks  10  . clopidogrel (PLAVIX) 75 MG tablet Take 1 tablet (75 mg total) by mouth daily with breakfast. 90 tablet 3  . colchicine 0.6 MG tablet Take 1 tablet (0.6 mg total) by mouth daily. 30 tablet 0  . Fluticasone-Salmeterol (ADVAIR) 250-50 MCG/DOSE AEPB Inhale 1 puff into the lungs 2 (two) times daily. 60 each 3  . gentamicin cream (GARAMYCIN) 0.1 % Apply 1 application topically See admin instructions. Apply to access site nightly    . hydrALAZINE (APRESOLINE) 25 MG tablet Take 1 tablet (25 mg total) by mouth 3 (three) times daily. 270 tablet 3  . insulin aspart (NOVOLOG) 100 UNIT/ML injection 0-6 Units, Subcutaneous, 3 times daily with meals CBG < 70: Implement Hypoglycemia measures/call MD CBG 70 - 120: 0 units CBG 121 - 150: 0 units CBG 151 - 200: 1 unit CBG 201-250: 2 units CBG 251-300: 3 units CBG 301-350: 4 units CBG 351-400: 5 units CBG > 400: Give 6 units and call MD 10 mL 0  . insulin glargine, 2 Unit Dial, (TOUJEO MAX SOLOSTAR) 300 UNIT/ML Solostar Pen Inject 54 Units into the skin at bedtime.     . insulin regular (NOVOLIN R) 100 units/mL injection Inject 0-10 Units into the skin 3 (three) times daily before meals.    . iron sucrose in sodium chloride 0.9 % 100 mL Iron Sucrose (Venofer)    . isosorbide mononitrate (IMDUR) 30 MG 24 hr tablet Take 0.5 tablets (15 mg total) by mouth daily. 45 tablet 3  . methocarbamol (ROBAXIN) 500 MG tablet Take 1 tablet (500 mg total) by mouth 2 (two) times daily as needed for muscle spasms. 60 tablet 1  . Methoxy PEG-Epoetin Beta (MIRCERA IJ) Inject into the skin.    [name] metoCLOPramide (REGLAN) 5 MG tablet TAKE 1 TABLET BY MOUTH 4 TIMES DAILY BEFORE MEALS AND AT BEDTIME. (Patient taking differently: Take 5 mg by  mouth 4 (four) times daily -  before meals and at bedtime. ) 90 tablet 0  . metoprolol succinate (TOPROL-XL) 50 MG 24 hr tablet Take 75 mg (1.5 tablet) daily 135 tablet 3  . montelukast (SINGULAIR) 10 MG tablet Take 1 tablet (10 mg total) by mouth at bedtime. 30 tablet 5  . nitroGLYCERIN (NITROSTAT) 0.4 MG SL tablet Place 1 tablet (0.4 mg total) under the tongue every 5 (five) minutes as needed for chest pain. 25 tablet 2  .  ondansetron (ZOFRAN ODT) 4 MG disintegrating tablet Take 1 tablet (4 mg total) by mouth every 8 (eight) hours as needed for nausea or vomiting. 20 tablet 0  . pantoprazole (PROTONIX) 40 MG tablet TAKE 1 TABLET BY MOUTH DAILY. (Patient taking differently: Take 40 mg by mouth daily. ) 30 tablet 7  . trimethoprim-polymyxin b (POLYTRIM) ophthalmic solution Place 1 drop into both eyes See admin instructions. Instill one drop into both eyes four times daily for 2 days after each monthly eye injection  12   No current facility-administered medications for this visit.    Allergies:   Penicillins, Repatha [evolocumab], Other, and Lisinopril    Social History:  The patient  reports that she quit smoking about 2 years ago. Her smoking use included cigarettes. She quit after 20.00 years of use. She has never used smokeless tobacco. She reports that she does not drink alcohol and does not use drugs.   Family History:  The patient's family history includes Allergic rhinitis in her brother, father, mother, and sister; Breast cancer in her cousin; Hypertension in her brother, father, and sister.    ROS: All other systems are reviewed and negative. Unless otherwise mentioned in H&P    PHYSICAL EXAM: VS:  BP 128/62   Pulse 85   Ht 5\' 5"  (1.651 m)   Wt 150 lb (68 kg)   SpO2 99%   BMI 24.96 kg/m  , BMI Body mass index is 24.96 kg/m. GEN: Well nourished, well developed, in no acute distress HEENT: normal Neck: no JVD, radiation carotid bruits, or masses Cardiac: RRR; 2/6 systolic  murmurs, rubs, or gallops,no edema AV fistula and left brachial with good thrill.  Dressing intact. Respiratory:  Clear to auscultation bilaterally, normal work of breathing GI: soft, nontender, nondistended, + BS MS: no deformity or atrophy Skin: warm and dry, no rash Neuro:  Strength and sensation are intact Psych: euthymic mood, full affect   EKG: Not completed this office visit  Recent Labs: 02/24/2019: ALT 16 06/08/2019: Platelets 376 07/18/2019: BUN 45; Creatinine, Ser 6.30; Hemoglobin 17.0; Potassium 3.8; Sodium 139    Lipid Panel    Component Value Date/Time   CHOL 152 01/19/2019 1448   TRIG 149 01/19/2019 1448   HDL 36 (L) 01/19/2019 1448   CHOLHDL 4.2 01/19/2019 1448   CHOLHDL 7.2 Ratio 01/01/2010 2055   VLDL 30 01/01/2010 2055   LDLCALC 90 01/19/2019 1448      Wt Readings from Last 3 Encounters:  07/23/19 150 lb (68 kg)  07/18/19 148 lb (67.1 kg)  07/11/19 147 lb 14.4 oz (67.1 kg)      Other studies Reviewed: Echocardiogram 07/21/19 1. Left ventricular ejection fraction, by estimation, is 60 to 65%. The  left ventricle has normal function. The left ventricle has no regional  wall motion abnormalities. There is moderate left ventricular hypertrophy.  Left ventricular diastolic  parameters are consistent with Grade II diastolic dysfunction  (pseudonormalization).  2. Right ventricular systolic function is normal. The right ventricular  size is normal. There is mildly elevated pulmonary artery systolic  pressure. The estimated right ventricular systolic pressure is 76.7 mmHg.  3. Left atrial size was moderately dilated.  4. The mitral valve is normal in structure. Trivial mitral valve  regurgitation. No evidence of mitral stenosis.  5. The aortic valve is tricuspid. Aortic valve regurgitation is trivial.  Mild aortic valve sclerosis is present, with no evidence of aortic valve  stenosis.  6. The inferior vena cava is normal in  size with greater than  50%  respiratory variability, suggesting right atrial pressure of 3 mmHg.   ABI's 07/11/2019 Right: Resting right ankle-brachial index indicates noncompressible right  lower extremity arteries. The right toe-brachial index is abnormal. RT  great toe pressure = 104 mmHg.   Left: Resting left ankle-brachial index indicates mild left lower  extremity arterial disease. The left toe-brachial index is abnormal. LT  Great toe pressure = 111 mmHg.  Pedal pressures may be falsely elevated.    ASSESSMENT AND PLAN:  1.  CAD: Status post stenting of the mid LAD in July 2018.  She also had moderate to mid RCA stenosis which was not addressed and left main spasm related to guide catheter during the procedure.  She denies any recurrent chest pain dyspnea or extreme fatigue.  She will continue on current medication regimen to include dual antiplatelet therapy with 75 mg of Plavix and aspirin 81 mg.  2.  Hypertension: Currently well controlled.  She will remain on amlodipine and hydralazine as directed.  She also will continue isosorbide 15 mg daily.  3.  PAD: Followed by Dr. Carlis Abbott.  Status post left common femoral profunda FSA stent which were found to be patent on 07/18/2018.  Oblique imaging of moderate 50% stenosis just distal to external iliac stent which was subsequently extended and second stent in the LAD external iliac lesion was placed.  Current medicines are reviewed at length with the patient today.  I have spent 20 minutes  dedicated to the care of this patient on the date of this encounter to include pre-visit review of records, assessment, management and diagnostic testing,with shared decision making.  Labs/ tests ordered today include: None  Phill Myron. [place], ANP, AACC   07/23/2019 5:20 PM    [facility] [place] [address] Office [phone] Fax [phone]  Notice: This dictation was prepared with Dragon dictation along with smaller phrase  technology. Any transcriptional errors that result from this process are unintentional and may not be corrected upon review.

## 2019-07-23 ENCOUNTER — Other Ambulatory Visit: Payer: Self-pay

## 2019-07-23 ENCOUNTER — Encounter: Payer: Self-pay | Admitting: Adult Health

## 2019-07-23 ENCOUNTER — Ambulatory Visit (INDEPENDENT_AMBULATORY_CARE_PROVIDER_SITE_OTHER): Payer: Medicare Other | Admitting: Adult Health

## 2019-07-23 VITALS — BP 128/62 | HR 85 | Ht 65.0 in | Wt 150.0 lb

## 2019-07-23 DIAGNOSIS — I10 Essential (primary) hypertension: Secondary | ICD-10-CM | POA: Diagnosis not present

## 2019-07-23 DIAGNOSIS — E785 Hyperlipidemia, unspecified: Secondary | ICD-10-CM

## 2019-07-23 DIAGNOSIS — N184 Chronic kidney disease, stage 4 (severe): Secondary | ICD-10-CM

## 2019-07-23 DIAGNOSIS — I739 Peripheral vascular disease, unspecified: Secondary | ICD-10-CM

## 2019-07-23 DIAGNOSIS — R82998 Other abnormal findings in urine: Secondary | ICD-10-CM | POA: Diagnosis not present

## 2019-07-23 DIAGNOSIS — E7849 Other hyperlipidemia: Secondary | ICD-10-CM | POA: Diagnosis not present

## 2019-07-23 DIAGNOSIS — Z992 Dependence on renal dialysis: Secondary | ICD-10-CM | POA: Diagnosis not present

## 2019-07-23 DIAGNOSIS — Z9861 Coronary angioplasty status: Secondary | ICD-10-CM | POA: Diagnosis not present

## 2019-07-23 DIAGNOSIS — D631 Anemia in chronic kidney disease: Secondary | ICD-10-CM | POA: Diagnosis not present

## 2019-07-23 DIAGNOSIS — N186 End stage renal disease: Secondary | ICD-10-CM | POA: Diagnosis not present

## 2019-07-23 DIAGNOSIS — E1122 Type 2 diabetes mellitus with diabetic chronic kidney disease: Secondary | ICD-10-CM | POA: Diagnosis not present

## 2019-07-23 DIAGNOSIS — I2510 Atherosclerotic heart disease of native coronary artery without angina pectoris: Secondary | ICD-10-CM

## 2019-07-23 DIAGNOSIS — N2581 Secondary hyperparathyroidism of renal origin: Secondary | ICD-10-CM | POA: Diagnosis not present

## 2019-07-23 DIAGNOSIS — D649 Anemia, unspecified: Secondary | ICD-10-CM | POA: Diagnosis not present

## 2019-07-23 NOTE — Patient Instructions (Signed)

## 2019-07-24 DIAGNOSIS — Z992 Dependence on renal dialysis: Secondary | ICD-10-CM | POA: Diagnosis not present

## 2019-07-24 DIAGNOSIS — D631 Anemia in chronic kidney disease: Secondary | ICD-10-CM | POA: Diagnosis not present

## 2019-07-24 DIAGNOSIS — N2581 Secondary hyperparathyroidism of renal origin: Secondary | ICD-10-CM | POA: Diagnosis not present

## 2019-07-24 DIAGNOSIS — D649 Anemia, unspecified: Secondary | ICD-10-CM | POA: Diagnosis not present

## 2019-07-24 DIAGNOSIS — N186 End stage renal disease: Secondary | ICD-10-CM | POA: Diagnosis not present

## 2019-07-27 DIAGNOSIS — N2589 Other disorders resulting from impaired renal tubular function: Secondary | ICD-10-CM | POA: Diagnosis not present

## 2019-07-27 DIAGNOSIS — N186 End stage renal disease: Secondary | ICD-10-CM | POA: Diagnosis not present

## 2019-07-27 DIAGNOSIS — Z992 Dependence on renal dialysis: Secondary | ICD-10-CM | POA: Diagnosis not present

## 2019-07-27 DIAGNOSIS — N2581 Secondary hyperparathyroidism of renal origin: Secondary | ICD-10-CM | POA: Diagnosis not present

## 2019-07-30 DIAGNOSIS — N186 End stage renal disease: Secondary | ICD-10-CM | POA: Diagnosis not present

## 2019-07-30 DIAGNOSIS — N2589 Other disorders resulting from impaired renal tubular function: Secondary | ICD-10-CM | POA: Diagnosis not present

## 2019-07-30 DIAGNOSIS — N2581 Secondary hyperparathyroidism of renal origin: Secondary | ICD-10-CM | POA: Diagnosis not present

## 2019-07-30 DIAGNOSIS — Z992 Dependence on renal dialysis: Secondary | ICD-10-CM | POA: Diagnosis not present

## 2019-08-01 ENCOUNTER — Other Ambulatory Visit: Payer: Self-pay

## 2019-08-01 ENCOUNTER — Ambulatory Visit (HOSPITAL_COMMUNITY)
Admission: RE | Admit: 2019-08-01 | Discharge: 2019-08-01 | Disposition: A | Discharge status: Home / Self Care | Payer: Medicare Other | Source: Ambulatory Visit | Attending: Cardiology | Admitting: Cardiology

## 2019-08-01 DIAGNOSIS — I409 Acute myocarditis, unspecified: Secondary | ICD-10-CM | POA: Insufficient documentation

## 2019-08-01 DIAGNOSIS — R0602 Shortness of breath: Secondary | ICD-10-CM | POA: Diagnosis not present

## 2019-08-03 ENCOUNTER — Other Ambulatory Visit: Payer: Self-pay | Admitting: Family Medicine

## 2019-08-03 DIAGNOSIS — Z1231 Encounter for screening mammogram for malignant neoplasm of breast: Secondary | ICD-10-CM

## 2019-08-06 ENCOUNTER — Telehealth: Payer: Self-pay | Admitting: Cardiovascular Disease

## 2019-08-06 DIAGNOSIS — N186 End stage renal disease: Secondary | ICD-10-CM | POA: Diagnosis not present

## 2019-08-06 DIAGNOSIS — Z992 Dependence on renal dialysis: Secondary | ICD-10-CM | POA: Diagnosis not present

## 2019-08-06 DIAGNOSIS — D631 Anemia in chronic kidney disease: Secondary | ICD-10-CM | POA: Diagnosis not present

## 2019-08-06 DIAGNOSIS — N2581 Secondary hyperparathyroidism of renal origin: Secondary | ICD-10-CM | POA: Diagnosis not present

## 2019-08-06 DIAGNOSIS — D649 Anemia, unspecified: Secondary | ICD-10-CM | POA: Diagnosis not present

## 2019-08-06 MED ORDER — HYDRALAZINE HCL 25 MG PO TABS: 25.0000 mg | ORAL_TABLET | Freq: Three times a day (TID) | ORAL | 3 refills | Status: DC

## 2019-08-06 MED ORDER — ISOSORBIDE MONONITRATE ER 30 MG PO TB24: 15.0000 mg | ORAL_TABLET | Freq: Every day | ORAL | 3 refills | Status: DC

## 2019-08-06 MED ORDER — CLOPIDOGREL BISULFATE 75 MG PO TABS: 75.0000 mg | ORAL_TABLET | Freq: Every day | ORAL | 3 refills | Status: DC

## 2019-08-06 MED ORDER — PANTOPRAZOLE SODIUM 40 MG PO TBEC: 40.0000 mg | DELAYED_RELEASE_TABLET | Freq: Every day | ORAL | 7 refills | Status: DC

## 2019-08-06 MED ORDER — AMLODIPINE BESYLATE 10 MG PO TABS: 10.0000 mg | ORAL_TABLET | Freq: Every day | ORAL | 3 refills | Status: DC

## 2019-08-06 MED ORDER — NITROGLYCERIN 0.4 MG SL SUBL: 0.4000 mg | SUBLINGUAL_TABLET | SUBLINGUAL | 2 refills | Status: AC | PRN

## 2019-08-06 NOTE — Telephone Encounter (Signed)
Pt called to request that all her medications be sent to [id number] - [place], [place] - [place]

## 2019-08-07 DIAGNOSIS — N2581 Secondary hyperparathyroidism of renal origin: Secondary | ICD-10-CM | POA: Diagnosis not present

## 2019-08-07 DIAGNOSIS — D631 Anemia in chronic kidney disease: Secondary | ICD-10-CM | POA: Diagnosis not present

## 2019-08-07 DIAGNOSIS — N186 End stage renal disease: Secondary | ICD-10-CM | POA: Diagnosis not present

## 2019-08-07 DIAGNOSIS — Z992 Dependence on renal dialysis: Secondary | ICD-10-CM | POA: Diagnosis not present

## 2019-08-07 DIAGNOSIS — D649 Anemia, unspecified: Secondary | ICD-10-CM | POA: Diagnosis not present

## 2019-08-08 DIAGNOSIS — N2581 Secondary hyperparathyroidism of renal origin: Secondary | ICD-10-CM | POA: Diagnosis not present

## 2019-08-08 DIAGNOSIS — D631 Anemia in chronic kidney disease: Secondary | ICD-10-CM | POA: Diagnosis not present

## 2019-08-08 DIAGNOSIS — N186 End stage renal disease: Secondary | ICD-10-CM | POA: Diagnosis not present

## 2019-08-08 DIAGNOSIS — Z992 Dependence on renal dialysis: Secondary | ICD-10-CM | POA: Diagnosis not present

## 2019-08-08 DIAGNOSIS — D649 Anemia, unspecified: Secondary | ICD-10-CM | POA: Diagnosis not present

## 2019-08-09 ENCOUNTER — Other Ambulatory Visit: Payer: Self-pay

## 2019-08-09 DIAGNOSIS — I739 Peripheral vascular disease, unspecified: Secondary | ICD-10-CM

## 2019-08-12 DIAGNOSIS — Z992 Dependence on renal dialysis: Secondary | ICD-10-CM | POA: Diagnosis not present

## 2019-08-12 DIAGNOSIS — N186 End stage renal disease: Secondary | ICD-10-CM | POA: Diagnosis not present

## 2019-08-12 DIAGNOSIS — E1122 Type 2 diabetes mellitus with diabetic chronic kidney disease: Secondary | ICD-10-CM | POA: Diagnosis not present

## 2019-08-13 DIAGNOSIS — T7840XS Allergy, unspecified, sequela: Secondary | ICD-10-CM | POA: Insufficient documentation

## 2019-08-13 DIAGNOSIS — T782XXS Anaphylactic shock, unspecified, sequela: Secondary | ICD-10-CM | POA: Insufficient documentation

## 2019-08-13 DIAGNOSIS — L299 Pruritus, unspecified: Secondary | ICD-10-CM | POA: Insufficient documentation

## 2019-08-14 DIAGNOSIS — Z992 Dependence on renal dialysis: Secondary | ICD-10-CM | POA: Diagnosis not present

## 2019-08-14 DIAGNOSIS — N186 End stage renal disease: Secondary | ICD-10-CM | POA: Diagnosis not present

## 2019-08-14 DIAGNOSIS — E1122 Type 2 diabetes mellitus with diabetic chronic kidney disease: Secondary | ICD-10-CM | POA: Diagnosis not present

## 2019-08-14 DIAGNOSIS — N2581 Secondary hyperparathyroidism of renal origin: Secondary | ICD-10-CM | POA: Diagnosis not present

## 2019-08-14 DIAGNOSIS — D631 Anemia in chronic kidney disease: Secondary | ICD-10-CM | POA: Diagnosis not present

## 2019-08-14 DIAGNOSIS — D509 Iron deficiency anemia, unspecified: Secondary | ICD-10-CM | POA: Diagnosis not present

## 2019-08-16 DIAGNOSIS — N2581 Secondary hyperparathyroidism of renal origin: Secondary | ICD-10-CM | POA: Diagnosis not present

## 2019-08-16 DIAGNOSIS — Z992 Dependence on renal dialysis: Secondary | ICD-10-CM | POA: Diagnosis not present

## 2019-08-16 DIAGNOSIS — D631 Anemia in chronic kidney disease: Secondary | ICD-10-CM | POA: Diagnosis not present

## 2019-08-16 DIAGNOSIS — N186 End stage renal disease: Secondary | ICD-10-CM | POA: Diagnosis not present

## 2019-08-16 DIAGNOSIS — E1122 Type 2 diabetes mellitus with diabetic chronic kidney disease: Secondary | ICD-10-CM | POA: Diagnosis not present

## 2019-08-16 DIAGNOSIS — D509 Iron deficiency anemia, unspecified: Secondary | ICD-10-CM | POA: Diagnosis not present

## 2019-08-18 DIAGNOSIS — Z992 Dependence on renal dialysis: Secondary | ICD-10-CM | POA: Diagnosis not present

## 2019-08-18 DIAGNOSIS — D631 Anemia in chronic kidney disease: Secondary | ICD-10-CM | POA: Diagnosis not present

## 2019-08-18 DIAGNOSIS — N186 End stage renal disease: Secondary | ICD-10-CM | POA: Diagnosis not present

## 2019-08-18 DIAGNOSIS — N2581 Secondary hyperparathyroidism of renal origin: Secondary | ICD-10-CM | POA: Diagnosis not present

## 2019-08-18 DIAGNOSIS — E1122 Type 2 diabetes mellitus with diabetic chronic kidney disease: Secondary | ICD-10-CM | POA: Diagnosis not present

## 2019-08-18 DIAGNOSIS — D509 Iron deficiency anemia, unspecified: Secondary | ICD-10-CM | POA: Diagnosis not present

## 2019-08-21 ENCOUNTER — Encounter: Payer: Medicare Other | Admitting: Vascular Surgery

## 2019-08-21 ENCOUNTER — Ambulatory Visit
Admission: RE | Admit: 2019-08-21 | Discharge: 2019-08-21 | Disposition: A | Discharge status: Home / Self Care | Payer: Medicare Other | Source: Ambulatory Visit | Attending: Family Medicine | Admitting: Family Medicine

## 2019-08-21 ENCOUNTER — Ambulatory Visit (HOSPITAL_COMMUNITY): Payer: Medicare Other

## 2019-08-21 ENCOUNTER — Other Ambulatory Visit: Payer: Self-pay

## 2019-08-21 DIAGNOSIS — Z1231 Encounter for screening mammogram for malignant neoplasm of breast: Secondary | ICD-10-CM | POA: Diagnosis not present

## 2019-08-23 ENCOUNTER — Telehealth: Payer: Self-pay

## 2019-08-23 DIAGNOSIS — D509 Iron deficiency anemia, unspecified: Secondary | ICD-10-CM | POA: Diagnosis not present

## 2019-08-23 DIAGNOSIS — N186 End stage renal disease: Secondary | ICD-10-CM | POA: Diagnosis not present

## 2019-08-23 DIAGNOSIS — N2581 Secondary hyperparathyroidism of renal origin: Secondary | ICD-10-CM | POA: Diagnosis not present

## 2019-08-23 DIAGNOSIS — D631 Anemia in chronic kidney disease: Secondary | ICD-10-CM | POA: Diagnosis not present

## 2019-08-23 DIAGNOSIS — Z992 Dependence on renal dialysis: Secondary | ICD-10-CM | POA: Diagnosis not present

## 2019-08-23 DIAGNOSIS — E1122 Type 2 diabetes mellitus with diabetic chronic kidney disease: Secondary | ICD-10-CM | POA: Diagnosis not present

## 2019-08-23 NOTE — Telephone Encounter (Signed)
-----   Message from Charlott Rakes, MD sent at 08/21/2019  5:56 PM EDT ----- Mammogram is negative for malignancy

## 2019-08-23 NOTE — Telephone Encounter (Signed)
Patient name and DOB has been verified Patient was informed of lab results. Patient had no questions.  

## 2019-08-25 DIAGNOSIS — E1122 Type 2 diabetes mellitus with diabetic chronic kidney disease: Secondary | ICD-10-CM | POA: Diagnosis not present

## 2019-08-25 DIAGNOSIS — N186 End stage renal disease: Secondary | ICD-10-CM | POA: Diagnosis not present

## 2019-08-25 DIAGNOSIS — D631 Anemia in chronic kidney disease: Secondary | ICD-10-CM | POA: Diagnosis not present

## 2019-08-25 DIAGNOSIS — N2581 Secondary hyperparathyroidism of renal origin: Secondary | ICD-10-CM | POA: Diagnosis not present

## 2019-08-25 DIAGNOSIS — D509 Iron deficiency anemia, unspecified: Secondary | ICD-10-CM | POA: Diagnosis not present

## 2019-08-25 DIAGNOSIS — Z992 Dependence on renal dialysis: Secondary | ICD-10-CM | POA: Diagnosis not present

## 2019-08-28 ENCOUNTER — Encounter: Payer: Self-pay | Admitting: Nephrology

## 2019-08-28 DIAGNOSIS — D509 Iron deficiency anemia, unspecified: Secondary | ICD-10-CM | POA: Diagnosis not present

## 2019-08-28 DIAGNOSIS — N186 End stage renal disease: Secondary | ICD-10-CM | POA: Diagnosis not present

## 2019-08-28 DIAGNOSIS — D631 Anemia in chronic kidney disease: Secondary | ICD-10-CM | POA: Diagnosis not present

## 2019-08-28 DIAGNOSIS — Z992 Dependence on renal dialysis: Secondary | ICD-10-CM | POA: Diagnosis not present

## 2019-08-28 DIAGNOSIS — N2581 Secondary hyperparathyroidism of renal origin: Secondary | ICD-10-CM | POA: Diagnosis not present

## 2019-08-28 DIAGNOSIS — E1122 Type 2 diabetes mellitus with diabetic chronic kidney disease: Secondary | ICD-10-CM | POA: Diagnosis not present

## 2019-08-30 DIAGNOSIS — N186 End stage renal disease: Secondary | ICD-10-CM | POA: Diagnosis not present

## 2019-08-30 DIAGNOSIS — D509 Iron deficiency anemia, unspecified: Secondary | ICD-10-CM | POA: Diagnosis not present

## 2019-08-30 DIAGNOSIS — E1122 Type 2 diabetes mellitus with diabetic chronic kidney disease: Secondary | ICD-10-CM | POA: Diagnosis not present

## 2019-08-30 DIAGNOSIS — Z992 Dependence on renal dialysis: Secondary | ICD-10-CM | POA: Diagnosis not present

## 2019-08-30 DIAGNOSIS — D631 Anemia in chronic kidney disease: Secondary | ICD-10-CM | POA: Diagnosis not present

## 2019-08-30 DIAGNOSIS — N2581 Secondary hyperparathyroidism of renal origin: Secondary | ICD-10-CM | POA: Diagnosis not present

## 2019-08-31 DIAGNOSIS — I871 Compression of vein: Secondary | ICD-10-CM | POA: Diagnosis not present

## 2019-08-31 DIAGNOSIS — Z992 Dependence on renal dialysis: Secondary | ICD-10-CM | POA: Diagnosis not present

## 2019-08-31 DIAGNOSIS — T82858A Stenosis of vascular prosthetic devices, implants and grafts, initial encounter: Secondary | ICD-10-CM | POA: Diagnosis not present

## 2019-08-31 DIAGNOSIS — N186 End stage renal disease: Secondary | ICD-10-CM | POA: Diagnosis not present

## 2019-09-01 DIAGNOSIS — N2581 Secondary hyperparathyroidism of renal origin: Secondary | ICD-10-CM | POA: Diagnosis not present

## 2019-09-01 DIAGNOSIS — E1122 Type 2 diabetes mellitus with diabetic chronic kidney disease: Secondary | ICD-10-CM | POA: Diagnosis not present

## 2019-09-01 DIAGNOSIS — D509 Iron deficiency anemia, unspecified: Secondary | ICD-10-CM | POA: Diagnosis not present

## 2019-09-01 DIAGNOSIS — N186 End stage renal disease: Secondary | ICD-10-CM | POA: Diagnosis not present

## 2019-09-01 DIAGNOSIS — Z992 Dependence on renal dialysis: Secondary | ICD-10-CM | POA: Diagnosis not present

## 2019-09-01 DIAGNOSIS — D631 Anemia in chronic kidney disease: Secondary | ICD-10-CM | POA: Diagnosis not present

## 2019-09-06 DIAGNOSIS — N186 End stage renal disease: Secondary | ICD-10-CM | POA: Diagnosis not present

## 2019-09-06 DIAGNOSIS — Z992 Dependence on renal dialysis: Secondary | ICD-10-CM | POA: Diagnosis not present

## 2019-09-06 DIAGNOSIS — E1122 Type 2 diabetes mellitus with diabetic chronic kidney disease: Secondary | ICD-10-CM | POA: Diagnosis not present

## 2019-09-06 DIAGNOSIS — D631 Anemia in chronic kidney disease: Secondary | ICD-10-CM | POA: Diagnosis not present

## 2019-09-06 DIAGNOSIS — N2581 Secondary hyperparathyroidism of renal origin: Secondary | ICD-10-CM | POA: Diagnosis not present

## 2019-09-06 DIAGNOSIS — D509 Iron deficiency anemia, unspecified: Secondary | ICD-10-CM | POA: Diagnosis not present

## 2019-09-08 DIAGNOSIS — E1122 Type 2 diabetes mellitus with diabetic chronic kidney disease: Secondary | ICD-10-CM | POA: Diagnosis not present

## 2019-09-08 DIAGNOSIS — N186 End stage renal disease: Secondary | ICD-10-CM | POA: Diagnosis not present

## 2019-09-08 DIAGNOSIS — Z992 Dependence on renal dialysis: Secondary | ICD-10-CM | POA: Diagnosis not present

## 2019-09-08 DIAGNOSIS — D631 Anemia in chronic kidney disease: Secondary | ICD-10-CM | POA: Diagnosis not present

## 2019-09-08 DIAGNOSIS — D509 Iron deficiency anemia, unspecified: Secondary | ICD-10-CM | POA: Diagnosis not present

## 2019-09-08 DIAGNOSIS — N2581 Secondary hyperparathyroidism of renal origin: Secondary | ICD-10-CM | POA: Diagnosis not present

## 2019-09-11 DIAGNOSIS — N2581 Secondary hyperparathyroidism of renal origin: Secondary | ICD-10-CM | POA: Diagnosis not present

## 2019-09-11 DIAGNOSIS — Z992 Dependence on renal dialysis: Secondary | ICD-10-CM | POA: Diagnosis not present

## 2019-09-11 DIAGNOSIS — D509 Iron deficiency anemia, unspecified: Secondary | ICD-10-CM | POA: Diagnosis not present

## 2019-09-11 DIAGNOSIS — N186 End stage renal disease: Secondary | ICD-10-CM | POA: Diagnosis not present

## 2019-09-11 DIAGNOSIS — E1122 Type 2 diabetes mellitus with diabetic chronic kidney disease: Secondary | ICD-10-CM | POA: Diagnosis not present

## 2019-09-11 DIAGNOSIS — D631 Anemia in chronic kidney disease: Secondary | ICD-10-CM | POA: Diagnosis not present

## 2019-09-12 DIAGNOSIS — Z992 Dependence on renal dialysis: Secondary | ICD-10-CM | POA: Diagnosis not present

## 2019-09-12 DIAGNOSIS — E1122 Type 2 diabetes mellitus with diabetic chronic kidney disease: Secondary | ICD-10-CM | POA: Diagnosis not present

## 2019-09-12 DIAGNOSIS — R197 Diarrhea, unspecified: Secondary | ICD-10-CM | POA: Insufficient documentation

## 2019-09-12 DIAGNOSIS — N186 End stage renal disease: Secondary | ICD-10-CM | POA: Diagnosis not present

## 2019-09-12 DIAGNOSIS — R110 Nausea: Secondary | ICD-10-CM | POA: Insufficient documentation

## 2019-09-20 DIAGNOSIS — D509 Iron deficiency anemia, unspecified: Secondary | ICD-10-CM | POA: Diagnosis not present

## 2019-09-20 DIAGNOSIS — N186 End stage renal disease: Secondary | ICD-10-CM | POA: Diagnosis not present

## 2019-09-20 DIAGNOSIS — N2581 Secondary hyperparathyroidism of renal origin: Secondary | ICD-10-CM | POA: Diagnosis not present

## 2019-09-20 DIAGNOSIS — D631 Anemia in chronic kidney disease: Secondary | ICD-10-CM | POA: Diagnosis not present

## 2019-09-20 DIAGNOSIS — Z992 Dependence on renal dialysis: Secondary | ICD-10-CM | POA: Diagnosis not present

## 2019-09-22 DIAGNOSIS — D631 Anemia in chronic kidney disease: Secondary | ICD-10-CM | POA: Diagnosis not present

## 2019-09-22 DIAGNOSIS — D509 Iron deficiency anemia, unspecified: Secondary | ICD-10-CM | POA: Diagnosis not present

## 2019-09-22 DIAGNOSIS — N186 End stage renal disease: Secondary | ICD-10-CM | POA: Diagnosis not present

## 2019-09-22 DIAGNOSIS — N2581 Secondary hyperparathyroidism of renal origin: Secondary | ICD-10-CM | POA: Diagnosis not present

## 2019-09-22 DIAGNOSIS — Z992 Dependence on renal dialysis: Secondary | ICD-10-CM | POA: Diagnosis not present

## 2019-09-24 DIAGNOSIS — N186 End stage renal disease: Secondary | ICD-10-CM | POA: Diagnosis not present

## 2019-09-24 DIAGNOSIS — D631 Anemia in chronic kidney disease: Secondary | ICD-10-CM | POA: Diagnosis not present

## 2019-09-24 DIAGNOSIS — Z992 Dependence on renal dialysis: Secondary | ICD-10-CM | POA: Diagnosis not present

## 2019-09-24 DIAGNOSIS — D509 Iron deficiency anemia, unspecified: Secondary | ICD-10-CM | POA: Diagnosis not present

## 2019-09-24 DIAGNOSIS — N2581 Secondary hyperparathyroidism of renal origin: Secondary | ICD-10-CM | POA: Diagnosis not present

## 2019-09-25 DIAGNOSIS — N2581 Secondary hyperparathyroidism of renal origin: Secondary | ICD-10-CM | POA: Diagnosis not present

## 2019-09-25 DIAGNOSIS — Z992 Dependence on renal dialysis: Secondary | ICD-10-CM | POA: Diagnosis not present

## 2019-09-25 DIAGNOSIS — N186 End stage renal disease: Secondary | ICD-10-CM | POA: Diagnosis not present

## 2019-09-25 DIAGNOSIS — D509 Iron deficiency anemia, unspecified: Secondary | ICD-10-CM | POA: Diagnosis not present

## 2019-09-25 DIAGNOSIS — D631 Anemia in chronic kidney disease: Secondary | ICD-10-CM | POA: Diagnosis not present

## 2019-09-27 DIAGNOSIS — D509 Iron deficiency anemia, unspecified: Secondary | ICD-10-CM | POA: Diagnosis not present

## 2019-09-27 DIAGNOSIS — Z992 Dependence on renal dialysis: Secondary | ICD-10-CM | POA: Diagnosis not present

## 2019-09-27 DIAGNOSIS — N2581 Secondary hyperparathyroidism of renal origin: Secondary | ICD-10-CM | POA: Diagnosis not present

## 2019-09-27 DIAGNOSIS — D631 Anemia in chronic kidney disease: Secondary | ICD-10-CM | POA: Diagnosis not present

## 2019-09-27 DIAGNOSIS — N186 End stage renal disease: Secondary | ICD-10-CM | POA: Diagnosis not present

## 2019-10-02 DIAGNOSIS — D509 Iron deficiency anemia, unspecified: Secondary | ICD-10-CM | POA: Diagnosis not present

## 2019-10-02 DIAGNOSIS — N186 End stage renal disease: Secondary | ICD-10-CM | POA: Diagnosis not present

## 2019-10-02 DIAGNOSIS — Z992 Dependence on renal dialysis: Secondary | ICD-10-CM | POA: Diagnosis not present

## 2019-10-02 DIAGNOSIS — N2581 Secondary hyperparathyroidism of renal origin: Secondary | ICD-10-CM | POA: Diagnosis not present

## 2019-10-02 DIAGNOSIS — D631 Anemia in chronic kidney disease: Secondary | ICD-10-CM | POA: Diagnosis not present

## 2019-10-04 DIAGNOSIS — N186 End stage renal disease: Secondary | ICD-10-CM | POA: Diagnosis not present

## 2019-10-04 DIAGNOSIS — D631 Anemia in chronic kidney disease: Secondary | ICD-10-CM | POA: Diagnosis not present

## 2019-10-04 DIAGNOSIS — N2581 Secondary hyperparathyroidism of renal origin: Secondary | ICD-10-CM | POA: Diagnosis not present

## 2019-10-04 DIAGNOSIS — D509 Iron deficiency anemia, unspecified: Secondary | ICD-10-CM | POA: Diagnosis not present

## 2019-10-04 DIAGNOSIS — Z992 Dependence on renal dialysis: Secondary | ICD-10-CM | POA: Diagnosis not present

## 2019-10-06 DIAGNOSIS — Z992 Dependence on renal dialysis: Secondary | ICD-10-CM | POA: Diagnosis not present

## 2019-10-06 DIAGNOSIS — N186 End stage renal disease: Secondary | ICD-10-CM | POA: Diagnosis not present

## 2019-10-06 DIAGNOSIS — D631 Anemia in chronic kidney disease: Secondary | ICD-10-CM | POA: Diagnosis not present

## 2019-10-06 DIAGNOSIS — N2581 Secondary hyperparathyroidism of renal origin: Secondary | ICD-10-CM | POA: Diagnosis not present

## 2019-10-06 DIAGNOSIS — D509 Iron deficiency anemia, unspecified: Secondary | ICD-10-CM | POA: Diagnosis not present

## 2019-10-09 DIAGNOSIS — N2581 Secondary hyperparathyroidism of renal origin: Secondary | ICD-10-CM | POA: Diagnosis not present

## 2019-10-09 DIAGNOSIS — D631 Anemia in chronic kidney disease: Secondary | ICD-10-CM | POA: Diagnosis not present

## 2019-10-09 DIAGNOSIS — Z992 Dependence on renal dialysis: Secondary | ICD-10-CM | POA: Diagnosis not present

## 2019-10-09 DIAGNOSIS — N186 End stage renal disease: Secondary | ICD-10-CM | POA: Diagnosis not present

## 2019-10-09 DIAGNOSIS — D509 Iron deficiency anemia, unspecified: Secondary | ICD-10-CM | POA: Diagnosis not present

## 2019-10-10 ENCOUNTER — Other Ambulatory Visit: Payer: Self-pay

## 2019-10-10 ENCOUNTER — Ambulatory Visit (INDEPENDENT_AMBULATORY_CARE_PROVIDER_SITE_OTHER): Payer: Medicare Other | Admitting: Cardiovascular Disease

## 2019-10-10 ENCOUNTER — Encounter: Payer: Self-pay | Admitting: Cardiovascular Disease

## 2019-10-10 VITALS — BP 142/60 | HR 68 | Temp 97.2°F | Ht 65.0 in | Wt 146.0 lb

## 2019-10-10 DIAGNOSIS — I10 Essential (primary) hypertension: Secondary | ICD-10-CM | POA: Diagnosis not present

## 2019-10-10 DIAGNOSIS — E785 Hyperlipidemia, unspecified: Secondary | ICD-10-CM | POA: Diagnosis not present

## 2019-10-10 DIAGNOSIS — I2510 Atherosclerotic heart disease of native coronary artery without angina pectoris: Secondary | ICD-10-CM | POA: Diagnosis not present

## 2019-10-10 DIAGNOSIS — R0989 Other specified symptoms and signs involving the circulatory and respiratory systems: Secondary | ICD-10-CM

## 2019-10-10 DIAGNOSIS — Z9861 Coronary angioplasty status: Secondary | ICD-10-CM

## 2019-10-10 NOTE — Assessment & Plan Note (Signed)
History of essential hypertension a blood pressure measured today 142/60.  She is on amlodipine and metoprolol.

## 2019-10-10 NOTE — Assessment & Plan Note (Addendum)
History of bilateral carotid bruits with no evidence of ICA stenosis by duplex ultrasound performed 08/01/2019.

## 2019-10-10 NOTE — Assessment & Plan Note (Signed)
History of PAD status post multiple interventions followed by Dr. Carlis Abbott.  She recently had left external iliac artery stenting.

## 2019-10-10 NOTE — Patient Instructions (Signed)
Medication Instructions:  The current medical regimen is effective;  continue present plan and medications.  *If you need a refill on your cardiac medications before your next appointment, please call your pharmacy*   Lab Work: LIPID/LIVER (come back fasting- nothing to eat or drink, no lab appointment needed)  If you have labs (blood work) drawn today and your tests are completely normal, you will receive your results only by: [name] MyChart Message (if you have MyChart) OR . A paper copy in the mail If you have any lab test that is abnormal or we need to change your treatment, we will call you to review the results.  Follow-Up: At [facility], you and your health needs are our priority.  As part of our continuing mission to provide you with exceptional heart care, we have created designated Provider Care Teams.  These Care Teams include your primary Cardiologist (physician) and Advanced Practice Providers (APPs -  Physician Assistants and Nurse Practitioners) who all work together to provide you with the care you need, when you need it.  We recommend signing up for the patient portal called "MyChart".  Sign up information is provided on this After Visit Summary.  MyChart is used to connect with patients for Virtual Visits (Telemedicine).  Patients are able to view lab/test results, encounter notes, upcoming appointments, etc.  Non-urgent messages can be sent to your provider as well.   To learn more about what you can do with MyChart, go to [email or web address].    Your next appointment:   Follow up with Jory Sims, NP in 6 months  Follow up with Dr.Berry in 12 months

## 2019-10-10 NOTE — Progress Notes (Signed)
10/10/2019 Susan Horton   1970/09/10  [id number]  Primary Physician Charlott Rakes, MD Primary Cardiologist: [name] MD FACP, [place], [place], [place]  HPI:  Susan Horton is a 49 y.o.  moderately overweight single African-American female mother of 10, grandmother and 4 grandchildren whoI last saw in the office6/22/2021.She has a history of treated hypertension, hyperlipidemia and diabetes which she's had for the last 22 years. She has CKD 3 with creatinine running in the mid 3 range followed by Dr. Jimmy Footman. She's noticed chest pain over the last 3-4 months occurring several times a week lasting minutes at a time worse with exertion. She had a Myoview stress test was read as intermediate risk with inferior ischemia. Based on this, I decided to proceed with outpatient radial diagnostic coronary angiography using limited contrast. This was performed 08/05/16 the right femoral approach revealed an 80% mid LAD which was stented using a synergy drug-eluting stent reducing 80% stenosis to 0% residual. She did have moderate mid RCA stenosis which was not addressed. She had left main spasm probably related to the guide catheter doing a procedure that responded to intra-coronary nitroglycerin. Her chest pain has resolved and she has stopped smoking since. She did have carotid Dopplers performed because of bruits that showed mild to moderate left ICA stenosis which will be followed on an annual basis. Since I saw her 4 months ago she has done well. She gets occasional chest pain during hemodialysis. She recently had a peritoneal dialysis placed yesterday and is planning on transitioning to home peritoneal dialysis. We have also talked about going on the kidney transplant list although her hemoglobin A1c is still elevated in the 11 range. She has stopped smoking. She was recently begun on Repatha for resistant hyperlipidemiabut unfortunately there was a lapse in her insurance last several months.  She will restart this.  She was tolerating peritoneal dialysis without difficulty.She has had right SFA intervention by Dr. Carlis Abbott recently. She had mentioned at her last visit that she was complaining of some mild increase in dyspnea on exertion. As a result of this, I ordered a 2D echo 12/22/2018 which was normal and a Myoview stress test 12/21/2018 which was normal as well.  She was admitted in February for sacral abscess and since that time has had worsening fatigue weakness and shortness of breath.  Since I saw her 3 months ago she has gone back to hemodialysis because of some bleeding from her peritoneal dialysis site.  She has had left external iliac artery intervention by Dr. Carlis Abbott in the recent past.  She no longer is claudication.  She denies chest pain or shortness of breath.   Current Meds  Medication Sig  . albuterol (PROVENTIL HFA;VENTOLIN HFA) 108 (90 BASE) MCG/ACT inhaler Inhale 1-2 puffs into the lungs every 6 (six) hours as needed for wheezing or shortness of breath.   . Alirocumab (PRALUENT) 75 MG/ML SOAJ Inject 75 mg into the skin every 14 (fourteen) days.  [name] amitriptyline (ELAVIL) 10 MG tablet Take 1 tablet (10 mg total) by mouth at bedtime.  [name] amLODipine (NORVASC) 10 MG tablet Take 1 tablet (10 mg total) by mouth daily.  [name] aspirin 81 MG chewable tablet Chew 1 tablet (81 mg total) by mouth daily.  . B Complex-C-Zn-Folic Acid (DIALYVITE 341 WITH ZINC) 0.8 MG TABS Take 1 tablet by mouth daily.  . calcitRIOL (ROCALTROL) 0.5 MCG capsule Take 1.5 mcg by mouth daily.  . calcium acetate (PHOSLO) 667 MG capsule  Take 1,334-2,001 mg by mouth See admin instructions. Take 2001 mg by mouth up to three times daily with meals and 1334 mg with snacks  . clopidogrel (PLAVIX) 75 MG tablet Take 1 tablet (75 mg total) by mouth daily with breakfast.  . Fluticasone-Salmeterol (ADVAIR) 250-50 MCG/DOSE AEPB Inhale 1 puff into the lungs 2 (two) times daily.  . hydrALAZINE (APRESOLINE) 25  MG tablet Take 1 tablet (25 mg total) by mouth 3 (three) times daily.  . insulin aspart (NOVOLOG) 100 UNIT/ML injection 0-6 Units, Subcutaneous, 3 times daily with meals CBG < 70: Implement Hypoglycemia measures/call MD CBG 70 - 120: 0 units CBG 121 - 150: 0 units CBG 151 - 200: 1 unit CBG 201-250: 2 units CBG 251-300: 3 units CBG 301-350: 4 units CBG 351-400: 5 units CBG > 400: Give 6 units and call MD  . insulin glargine, 2 Unit Dial, (TOUJEO MAX SOLOSTAR) 300 UNIT/ML Solostar Pen Inject 54 Units into the skin at bedtime.   . iron sucrose in sodium chloride 0.9 % 100 mL Iron Sucrose (Venofer)  . isosorbide mononitrate (IMDUR) 30 MG 24 hr tablet Take 0.5 tablets (15 mg total) by mouth daily.  . methocarbamol (ROBAXIN) 500 MG tablet Take 1 tablet (500 mg total) by mouth 2 (two) times daily as needed for muscle spasms.  . Methoxy PEG-Epoetin Beta (MIRCERA IJ) Inject into the skin.  [name] metoCLOPramide (REGLAN) 5 MG tablet TAKE 1 TABLET BY MOUTH 4 TIMES DAILY BEFORE MEALS AND AT BEDTIME. (Patient taking differently: Take 5 mg by mouth 4 (four) times daily -  before meals and at bedtime. )  . metoprolol succinate (TOPROL-XL) 50 MG 24 hr tablet Take 75 mg (1.5 tablet) daily  . montelukast (SINGULAIR) 10 MG tablet Take 1 tablet (10 mg total) by mouth at bedtime.  . nitroGLYCERIN (NITROSTAT) 0.4 MG SL tablet Place 1 tablet (0.4 mg total) under the tongue every 5 (five) minutes as needed for chest pain.  [name] ondansetron (ZOFRAN ODT) 4 MG disintegrating tablet Take 1 tablet (4 mg total) by mouth every 8 (eight) hours as needed for nausea or vomiting.  . pantoprazole (PROTONIX) 40 MG tablet Take 1 tablet (40 mg total) by mouth daily.  [name] trimethoprim-polymyxin b (POLYTRIM) ophthalmic solution Place 1 drop into both eyes See admin instructions. Instill one drop into both eyes four times daily for 2 days after each monthly eye injection     Allergies  Allergen Reactions  . Penicillins Anaphylaxis, Itching,  Swelling, Rash and Other (See Comments)    Swelling of throat & whole mouth  Has patient had a PCN reaction causing immediate rash, facial/tongue/throat swelling, SOB or lightheadedness with hypotension: Yes Has patient had a PCN reaction causing severe rash involving mucus membranes or skin necrosis: No Has patient had a PCN reaction that required hospitalization: No Has patient had a PCN reaction occurring within the last 10 years: No If all of the above answers are "NO", then may proceed with Cephalosporin use.  . Repatha [Evolocumab] Itching  . Other Itching  . Lisinopril Cough    Other reaction(s): Unknown    Social History   Socioeconomic History  . Marital status: Single    Spouse name: Not on file  . Number of children: 3  . Years of education: 42  . Highest education level: Not on file  Occupational History  . Occupation: Curator  Tobacco Use  . Smoking status: Former Smoker    Years: 20.00    Types: Cigarettes  Quit date: 11/2016    Years since quitting: 2.9  . Smokeless tobacco: Never Used  Vaping Use  . Vaping Use: Never used  Substance and Sexual Activity  . Alcohol use: No    Alcohol/week: 0.0 standard drinks  . Drug use: No  . Sexual activity: Not on file  Other Topics Concern  . Not on file  Social History Narrative   Lives with daughter in a 2 story home.  Has 3 children.  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  Education: college.    Social Determinants of Health   Financial Resource Strain:   . Difficulty of Paying Living Expenses: Not on file  Food Insecurity:   . Worried About Charity fundraiser in the Last Year: Not on file  . Ran Out of Food in the Last Year: Not on file  Transportation Needs:   . Lack of Transportation (Medical): Not on file  . Lack of Transportation (Non-Medical): Not on file  Physical Activity:   . Days of Exercise per Week: Not on file  . Minutes of Exercise per Session: Not on file   Stress:   . Feeling of Stress : Not on file  Social Connections:   . Frequency of Communication with Friends and Family: Not on file  . Frequency of Social Gatherings with Friends and Family: Not on file  . Attends Religious Services: Not on file  . Active Member of Clubs or Organizations: Not on file  . Attends Archivist Meetings: Not on file  . Marital Status: Not on file  Intimate Partner Violence:   . Fear of Current or Ex-Partner: Not on file  . Emotionally Abused: Not on file  . Physically Abused: Not on file  . Sexually Abused: Not on file     Review of Systems: General: negative for chills, fever, night sweats or weight changes.  Cardiovascular: negative for chest pain, dyspnea on exertion, edema, orthopnea, palpitations, paroxysmal nocturnal dyspnea or shortness of breath Dermatological: negative for rash Respiratory: negative for cough or wheezing Urologic: negative for hematuria Abdominal: negative for nausea, vomiting, diarrhea, bright red blood per rectum, melena, or hematemesis Neurologic: negative for visual changes, syncope, or dizziness All other systems reviewed and are otherwise negative except as noted above.    Blood pressure (!) 142/60, pulse 68, temperature (!) 97.2 F (36.2 C), height 5\' 5"  (1.651 m), weight 146 lb (66.2 kg).  General appearance: alert and no distress Neck: no adenopathy, no JVD, supple, symmetrical, trachea midline, thyroid not enlarged, symmetric, no tenderness/mass/nodules and Soft bilateral carotid bruits Lungs: clear to auscultation bilaterally Heart: regular rate and rhythm, S1, S2 normal, no murmur, click, rub or gallop Extremities: extremities normal, atraumatic, no cyanosis or edema Pulses: 2+ and symmetric Skin: Skin color, texture, turgor normal. No rashes or lesions Neurologic: Alert and oriented X 3, normal strength and tone. Normal symmetric reflexes. Normal coordination and gait  EKG not performed  today  ASSESSMENT AND PLAN:   CAD S/P mLAD PCI with DES History of CAD status post mid LAD PCI drug-eluting stenting by myself 08/05/2016 with a 2.5 mm x 16 mm long Synergy drug-eluting stent postdilated 2.77 mm.  She did have moderate RCA disease which was treated medically and normal LV function.  She no longer has chest pain.  Peripheral vascular disease, unspecified ([place]) History of PAD status post multiple interventions followed by Dr. Carlis Abbott.  She recently had left external iliac artery stenting.  Essential hypertension,  benign History of essential hypertension a blood pressure measured today 142/60.  She is on amlodipine and metoprolol.  Carotid bruit present History of bilateral carotid bruits with no evidence of ICA stenosis by duplex ultrasound performed 08/01/2019.  Dyslipidemia History of dyslipidemia on Praluent.  We will recheck a lipid liver profile.      [name] MD FACP,FACC,FAHA, [facility] 10/10/2019 3:36 PM

## 2019-10-10 NOTE — Assessment & Plan Note (Signed)
History of dyslipidemia on Praluent.  We will recheck a lipid liver profile.

## 2019-10-10 NOTE — Assessment & Plan Note (Signed)
History of CAD status post mid LAD PCI drug-eluting stenting by myself 08/05/2016 with a 2.5 mm x 16 mm long Synergy drug-eluting stent postdilated 2.77 mm.  She did have moderate RCA disease which was treated medically and normal LV function.  She no longer has chest pain.

## 2019-10-11 DIAGNOSIS — N2581 Secondary hyperparathyroidism of renal origin: Secondary | ICD-10-CM | POA: Diagnosis not present

## 2019-10-11 DIAGNOSIS — D631 Anemia in chronic kidney disease: Secondary | ICD-10-CM | POA: Diagnosis not present

## 2019-10-11 DIAGNOSIS — D509 Iron deficiency anemia, unspecified: Secondary | ICD-10-CM | POA: Diagnosis not present

## 2019-10-11 DIAGNOSIS — Z992 Dependence on renal dialysis: Secondary | ICD-10-CM | POA: Diagnosis not present

## 2019-10-11 DIAGNOSIS — N186 End stage renal disease: Secondary | ICD-10-CM | POA: Diagnosis not present

## 2019-10-13 DIAGNOSIS — N186 End stage renal disease: Secondary | ICD-10-CM | POA: Diagnosis not present

## 2019-10-13 DIAGNOSIS — Z992 Dependence on renal dialysis: Secondary | ICD-10-CM | POA: Diagnosis not present

## 2019-10-13 DIAGNOSIS — N2581 Secondary hyperparathyroidism of renal origin: Secondary | ICD-10-CM | POA: Diagnosis not present

## 2019-10-13 DIAGNOSIS — D631 Anemia in chronic kidney disease: Secondary | ICD-10-CM | POA: Diagnosis not present

## 2019-10-13 DIAGNOSIS — D509 Iron deficiency anemia, unspecified: Secondary | ICD-10-CM | POA: Diagnosis not present

## 2019-10-13 DIAGNOSIS — E1122 Type 2 diabetes mellitus with diabetic chronic kidney disease: Secondary | ICD-10-CM | POA: Diagnosis not present

## 2019-10-16 ENCOUNTER — Ambulatory Visit (HOSPITAL_COMMUNITY)
Admission: RE | Admit: 2019-10-16 | Discharge: 2019-10-16 | Disposition: A | Discharge status: Home / Self Care | Payer: Medicare Other | Source: Ambulatory Visit | Attending: Physician Assistant | Admitting: Physician Assistant

## 2019-10-16 ENCOUNTER — Other Ambulatory Visit: Payer: Self-pay

## 2019-10-16 ENCOUNTER — Ambulatory Visit (INDEPENDENT_AMBULATORY_CARE_PROVIDER_SITE_OTHER)
Admission: RE | Admit: 2019-10-16 | Discharge: 2019-10-16 | Disposition: A | Discharge status: Home / Self Care | Payer: Medicare Other | Source: Ambulatory Visit | Attending: Physician Assistant | Admitting: Physician Assistant

## 2019-10-16 ENCOUNTER — Ambulatory Visit (INDEPENDENT_AMBULATORY_CARE_PROVIDER_SITE_OTHER): Payer: Medicare Other | Admitting: Physician Assistant

## 2019-10-16 VITALS — BP 191/95 | HR 101 | Temp 98.4°F | Resp 20 | Ht 65.0 in | Wt 143.9 lb

## 2019-10-16 DIAGNOSIS — I739 Peripheral vascular disease, unspecified: Secondary | ICD-10-CM

## 2019-10-16 DIAGNOSIS — M25569 Pain in unspecified knee: Secondary | ICD-10-CM | POA: Insufficient documentation

## 2019-10-16 DIAGNOSIS — N186 End stage renal disease: Secondary | ICD-10-CM

## 2019-10-16 DIAGNOSIS — Z95828 Presence of other vascular implants and grafts: Secondary | ICD-10-CM | POA: Diagnosis not present

## 2019-10-16 DIAGNOSIS — Z992 Dependence on renal dialysis: Secondary | ICD-10-CM

## 2019-10-16 NOTE — Progress Notes (Signed)
Office Note     CC:  follow up Requesting Provider:  Charlott Rakes, MD  HPI: Susan Horton is a 49 y.o. (December 13, 1970) female who presents for follow up after Angiogram of her left lower extremity. On 07/18/19 she underwent a Aortogram, left lower extremity arteriogram with angioplasty of her left external iliac artery and stenting with an Innova stent by Dr. Carlis Abbott. On surveillance duplex she had elevated velocities in her previously placed left external iliac artery stent indicating need for recurrent intervention.  She reports no claudication symptoms, rest pain or non healing wounds. She says she is able to walk further now then before her intervention. She was able to walk all around the mall recently without having pain. She denies any swelling, pain or drainage from her right groin following her Arteriogram.  She has ESRD with a functioning LUE AV fistula placed by Dr. Carlis Abbott 09/14/17. She currently dialyzes TTS at [facility] location. She says fistula is working well  She is followed by Dr. Alvester Chou for bilateral carotid artery bruits and chronic diastolic heart failure. She was last seen on 10/10/19.  The pt is on a monoclonal antibody therapy for cholesterol management.  The pt is on a daily aspirin.   Other AC: Plavix The pt is on CCB, BB, HCTZ for hypertension.   The pt is diabetic.  No recent A1c Tobacco hx: former smoker, quit 2018  Past Medical History:  Diagnosis Date  . Abnormal stress test   . Anemia   . Angio-edema   . Asthma   . CAD (coronary artery disease)    DES to mid LAD July 2018, residual moderate RCA disease  . Cataract   . CKD (chronic kidney disease) stage 4, GFR 15-29 ml/min (HCC)    Dialysis T/Th/Sa  . DVT (deep venous thrombosis) ([place])    1996 during pregnancy, 2015 left leg  . Eczema   . Essential hypertension 12/11/2015  . Gastroparesis   . GERD (gastroesophageal reflux disease)   . Gluteal abscess 12/27/2018  . Hidradenitis   . Migraine   .  Neuropathy   . Peripheral vascular disease ([place])    blood clot in leg  . Progressive angina ([place]) 06/05/2014   Chest pain  . Type 2 diabetes mellitus ([place])   . Type II diabetes mellitus ([place])   . Urticaria     Past Surgical History:  Procedure Laterality Date  . A/V FISTULAGRAM N/A 06/22/2017   Procedure: A/V FISTULAGRAM - left arm;  Surgeon: [name], MD;  Location: [facility];  Service: Cardiovascular;  Laterality: N/A;  . ABDOMINAL AORTOGRAM W/LOWER EXTREMITY Bilateral 12/14/2018   Procedure: ABDOMINAL AORTOGRAM W/LOWER EXTREMITY;  Surgeon: [name], MD;  Location: [facility];  Service: Cardiovascular;  Laterality: Bilateral;  . ABDOMINAL AORTOGRAM W/LOWER EXTREMITY N/A 04/25/2019   Procedure: ABDOMINAL AORTOGRAM W/LOWER EXTREMITY;  Surgeon: [name], MD;  Location: [facility];  Service: Cardiovascular;  Laterality: N/A;  . ABDOMINAL AORTOGRAM W/LOWER EXTREMITY Left 07/18/2019   Procedure: ABDOMINAL AORTOGRAM W/LOWER EXTREMITY;  Surgeon: [name], MD;  Location: [facility];  Service: Cardiovascular;  Laterality: Left;  . ABDOMINOPLASTY    . ADENOIDECTOMY    . APPENDECTOMY  1995  . AV FISTULA PLACEMENT Left 02/01/2017   Procedure: ARTERIOVENOUS BRACHIOCEPHALIC (AV) FISTULA CREATION;  Surgeon: [name], MD;  Location: [facility];  Service: Vascular;  Laterality: Left;  . CHOLECYSTECTOMY  1993  . CORONARY ANGIOPLASTY WITH  STENT PLACEMENT    . CORONARY STENT INTERVENTION N/A 08/05/2016   Procedure: Coronary Stent Intervention;  Surgeon: [name], MD;  Location: [facility];  Service: Cardiovascular;  Laterality: N/A;  . EXPLORATORY LAPAROTOMY  08/14/2005   lysis of adhesions, drainage of tubo-ovarian abscess  . FISTULA SUPERFICIALIZATION Left 09/14/2017   Procedure: FISTULA SUPERFICIALIZATION ARTERIOVENOUS FISTULA LEFT ARM;  Surgeon: [name], MD;  Location: [facility];  Service:  Vascular;  Laterality: Left;  . FLEXIBLE SIGMOIDOSCOPY N/A 01/04/2019   Procedure: FLEXIBLE SIGMOIDOSCOPY;  Surgeon: Clarene Essex, MD;  Location: [facility];  Service: Endoscopy;  Laterality: N/A;  . HYDRADENITIS EXCISION  02/08/2011   Procedure: EXCISION HYDRADENITIS GROIN;  Surgeon: [name], MD;  Location: [facility];  Service: General;  Laterality: N/A;  Excisioin of Hidradenitis Left groin  . INCISION AND DRAINAGE ABSCESS N/A 01/11/2019   Procedure: INCISION AND DRAINAGE SACRAL ABSCESS;  Surgeon: Georganna Skeans, MD;  Location: [facility];  Service: General;  Laterality: N/A;  . INGUINAL HIDRADENITIS EXCISION  07/06/2010   bilateral  . INSERTION OF DIALYSIS CATHETER N/A 09/14/2017   Procedure: INSERTION OF TUNNELED DIALYSIS CATHETER Right Internal Jugular;  Surgeon: [name], MD;  Location: [facility];  Service: Vascular;  Laterality: N/A;  . IR FLUORO GUIDE CV LINE RIGHT  03/01/2019  . IR US GUIDE BX ASP/DRAIN  01/01/2019  . IR US GUIDE VASC ACCESS RIGHT  03/01/2019  . LEFT HEART CATH AND CORONARY ANGIOGRAPHY N/A 08/05/2016   Procedure: Left Heart Cath and Coronary Angiography;  Surgeon: [name], MD;  Location: [facility];  Service: Cardiovascular;  Laterality: N/A;  . PERIPHERAL VASCULAR INTERVENTION Left 12/14/2018   Procedure: PERIPHERAL VASCULAR INTERVENTION;  Surgeon: [name], MD;  Location: [facility];  Service: Cardiovascular;  Laterality: Left;  SFA  . PERIPHERAL VASCULAR INTERVENTION Left 04/25/2019   Procedure: PERIPHERAL VASCULAR INTERVENTION;  Surgeon: [name], MD;  Location: [facility];  Service: Cardiovascular;  Laterality: Left;  right superficial femoral, and left iliac  . PERIPHERAL VASCULAR INTERVENTION Left 07/18/2019   Procedure: PERIPHERAL VASCULAR INTERVENTION;  Surgeon: [name], MD;  Location: [facility];  Service: Cardiovascular;  Laterality: Left;  external iliac  .  REDUCTION MAMMAPLASTY  2002  . TONSILLECTOMY    . TUBAL LIGATION  1996  . VAGINAL HYSTERECTOMY  08/04/2005   and cysto    Social History   Socioeconomic History  . Marital status: Single    Spouse name: Not on file  . Number of children: 3  . Years of education: 45  . Highest education level: Not on file  Occupational History  . Occupation: Curator  Tobacco Use  . Smoking status: Former Smoker    Years: 20.00    Types: Cigarettes    Quit date: 11/2016    Years since quitting: 2.9  . Smokeless tobacco: Never Used  Vaping Use  . Vaping Use: Never used  Substance and Sexual Activity  . Alcohol use: No    Alcohol/week: 0.0 standard drinks  . Drug use: No  . Sexual activity: Not on file  Other Topics Concern  . Not on file  Social History Narrative   Lives with daughter in a 2 story home.  Has 3 children.  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  Education: college.    Social Determinants of Health   Financial Resource Strain:   .  Difficulty of Paying Living Expenses: Not on file  Food Insecurity:   . Worried About Charity fundraiser in the Last Year: Not on file  . Ran Out of Food in the Last Year: Not on file  Transportation Needs:   . Lack of Transportation (Medical): Not on file  . Lack of Transportation (Non-Medical): Not on file  Physical Activity:   . Days of Exercise per Week: Not on file  . Minutes of Exercise per Session: Not on file  Stress:   . Feeling of Stress : Not on file  Social Connections:   . Frequency of Communication with Friends and Family: Not on file  . Frequency of Social Gatherings with Friends and Family: Not on file  . Attends Religious Services: Not on file  . Active Member of Clubs or Organizations: Not on file  . Attends Archivist Meetings: Not on file  . Marital Status: Not on file  Intimate Partner Violence:   . Fear of Current or Ex-Partner: Not on file  . Emotionally Abused: Not on  file  . Physically Abused: Not on file  . Sexually Abused: Not on file    Family History  Problem Relation Age of Onset  . Allergic rhinitis Mother   . Hypertension Father   . Allergic rhinitis Father   . Hypertension Sister   . Allergic rhinitis Sister   . Hypertension Brother   . Allergic rhinitis Brother   . Breast cancer Cousin        x2    Current Outpatient Medications  Medication Sig Dispense Refill  . albuterol (PROVENTIL HFA;VENTOLIN HFA) 108 (90 BASE) MCG/ACT inhaler Inhale 1-2 puffs into the lungs every 6 (six) hours as needed for wheezing or shortness of breath.     . Alirocumab (PRALUENT) 75 MG/ML SOAJ Inject 75 mg into the skin every 14 (fourteen) days. 2 pen 11  . amitriptyline (ELAVIL) 10 MG tablet Take 1 tablet (10 mg total) by mouth at bedtime. 30 tablet 1  . amLODipine (NORVASC) 10 MG tablet Take 1 tablet (10 mg total) by mouth daily. 90 tablet 3  . aspirin 81 MG chewable tablet Chew 1 tablet (81 mg total) by mouth daily. 30 tablet 0  . atorvastatin (LIPITOR) 40 MG tablet Take 40 mg by mouth daily.    . B Complex-C-Zn-Folic Acid (DIALYVITE 710 WITH ZINC) 0.8 MG TABS Take 1 tablet by mouth daily.    . calcitRIOL (ROCALTROL) 0.5 MCG capsule Take 1.5 mcg by mouth daily.    . calcium acetate (PHOSLO) 667 MG capsule Take 1,334-2,001 mg by mouth See admin instructions. Take 2001 mg by mouth up to three times daily with meals and 1334 mg with snacks  10  . clopidogrel (PLAVIX) 75 MG tablet Take 1 tablet (75 mg total) by mouth daily with breakfast. 90 tablet 3  . doxercalciferol (HECTOROL) 4 MCG/2ML injection Doxercalciferol (Hectorol)    . Fluticasone-Salmeterol (ADVAIR) 250-50 MCG/DOSE AEPB Inhale 1 puff into the lungs 2 (two) times daily. 60 each 3  . heparin 1000 unit/mL SOLN injection Heparin Sodium (Porcine) 1,000 Units/mL Systemic    . hydrALAZINE (APRESOLINE) 25 MG tablet Take 1 tablet (25 mg total) by mouth 3 (three) times daily. 270 tablet 3  . insulin aspart  (NOVOLOG) 100 UNIT/ML injection 0-6 Units, Subcutaneous, 3 times daily with meals CBG < 70: Implement Hypoglycemia measures/call MD CBG 70 - 120: 0 units CBG 121 - 150: 0 units CBG 151 - 200: 1 unit  CBG 201-250: 2 units CBG 251-300: 3 units CBG 301-350: 4 units CBG 351-400: 5 units CBG > 400: Give 6 units and call MD 10 mL 0  . insulin glargine, 2 Unit Dial, (TOUJEO MAX SOLOSTAR) 300 UNIT/ML Solostar Pen Inject 54 Units into the skin at bedtime.     . iron sucrose (VENOFER) 20 MG/ML injection Iron Sucrose (Venofer)    . isosorbide mononitrate (IMDUR) 30 MG 24 hr tablet Take 0.5 tablets (15 mg total) by mouth daily. 45 tablet 3  . methocarbamol (ROBAXIN) 500 MG tablet Take 1 tablet (500 mg total) by mouth 2 (two) times daily as needed for muscle spasms. 60 tablet 1  . Methoxy PEG-Epoetin Beta (MIRCERA IJ) Inject into the skin.    [name] metoCLOPramide (REGLAN) 5 MG tablet TAKE 1 TABLET BY MOUTH 4 TIMES DAILY BEFORE MEALS AND AT BEDTIME. (Patient taking differently: Take 5 mg by mouth 4 (four) times daily -  before meals and at bedtime. ) 90 tablet 0  . metoprolol succinate (TOPROL-XL) 50 MG 24 hr tablet Take 75 mg (1.5 tablet) daily 135 tablet 3  . montelukast (SINGULAIR) 10 MG tablet Take 1 tablet (10 mg total) by mouth at bedtime. 30 tablet 5  . nitroGLYCERIN (NITROSTAT) 0.4 MG SL tablet Place 1 tablet (0.4 mg total) under the tongue every 5 (five) minutes as needed for chest pain. 25 tablet 2  . ondansetron (ZOFRAN ODT) 4 MG disintegrating tablet Take 1 tablet (4 mg total) by mouth every 8 (eight) hours as needed for nausea or vomiting. 20 tablet 0  . pantoprazole (PROTONIX) 40 MG tablet Take 1 tablet (40 mg total) by mouth daily. 30 tablet 7  . trimethoprim-polymyxin b (POLYTRIM) ophthalmic solution Place 1 drop into both eyes See admin instructions. Instill one drop into both eyes four times daily for 2 days after each monthly eye injection  12   No current facility-administered medications for this  visit.    Allergies  Allergen Reactions  . Penicillins Anaphylaxis, Itching, Swelling, Rash and Other (See Comments)    Swelling of throat & whole mouth  Has patient had a PCN reaction causing immediate rash, facial/tongue/throat swelling, SOB or lightheadedness with hypotension: Yes Has patient had a PCN reaction causing severe rash involving mucus membranes or skin necrosis: No Has patient had a PCN reaction that required hospitalization: No Has patient had a PCN reaction occurring within the last 10 years: No If all of the above answers are "NO", then may proceed with Cephalosporin use.  . Repatha [Evolocumab] Itching  . Other Itching  . Lisinopril Cough    Other reaction(s): Unknown     REVIEW OF SYSTEMS:  [X]  denotes positive finding, [ ]  denotes negative finding Cardiac  Comments:  Chest pain or chest pressure:    Shortness of breath upon exertion:    Short of breath when lying flat:    Irregular heart rhythm:        Vascular    Pain in calf, thigh, or hip brought on by ambulation:    Pain in feet at night that wakes you up from your sleep:     Blood clot in your veins:    Leg swelling:         Pulmonary    Oxygen at home:    Productive cough:     Wheezing:         Neurologic    Sudden weakness in arms or legs:     Sudden numbness in arms  or legs:     Sudden onset of difficulty speaking or slurred speech:    Temporary loss of vision in one eye:     Problems with dizziness:         Gastrointestinal    Blood in stool:     Vomited blood:         Genitourinary    Burning when urinating:     Blood in urine:        Psychiatric    Major depression:         Hematologic    Bleeding problems:    Problems with blood clotting too easily:        Skin    Rashes or ulcers:        Constitutional    Fever or chills:      PHYSICAL EXAMINATION:  Vitals:   10/16/19 1011  BP: (!) 191/95  Pulse: (!) 101  Resp: 20  Temp: 98.4 F (36.9 C)  TempSrc: Temporal    SpO2: 97%  Weight: 143 lb 14.4 oz (65.3 kg)  Height: 5\' 5"  (1.651 m)    General:  WDWN in NAD; vital signs documented above Gait: Normal HENT: WNL, normocephalic Pulmonary: normal non-labored breathing , without Rales, rhonchi,  wheezing Cardiac: regular HR, without  Murmurs without carotid bruit Abdomen: soft, NT, no masses Vascular Exam/Pulses: LUE fistula with good thrill  Right Left  Radial 2+ (normal) 2+ (normal)  Femoral 2+ (normal) 2+ (normal)  Popliteal 2+ (normal) 2+ (normal)  DP 2+ (normal) 2+ (normal)  PT 1+ (weak) 1+ (weak)   Extremities: without ischemic changes, without Gangrene , without cellulitis; without open wounds; right groin access site is without swelling or hematoma. No palpable or pulsatile mass. Bilateral feet warm. Motor and sensation intact Musculoskeletal: no muscle wasting or atrophy  Neurologic: A&O X 3;  No focal weakness or paresthesias are detected Psychiatric:  The pt has Normal affect.   Non-Invasive Vascular Imaging:   10/16/19 +-------+-----------+-----------+------------+------------+  ABI/TBIToday's ABIToday's TBIPrevious ABIPrevious TBI  +-------+-----------+-----------+------------+------------+  Right [place]     0.69    [place]     0.59      +-------+-----------+-----------+------------+------------+  Left  1.12    0.73    0.93    0.63      +-------+-----------+-----------+------------+------------+   Arterial findings: 50-74% stenosis of the Right external iliac artery, and  50-74% Left external iliac artery stenosis, proximal to stent.  Stenosis: +-------------------+---------------+  Location      Stent       +-------------------+---------------+  Left External Iliac50-99% stenosis  +-------------------+---------------+       ASSESSMENT/PLAN:: 49 y.o. female here for follow up after Angiogram of her left lower extremity. On 07/18/19 she underwent a Aortogram, left lower  extremity arteriogram with angioplasty of her left external iliac artery and stenting with an Innova stent by Dr. Carlis Abbott. Symptoms in lower extremities are improved. She has improved walking distance. She does not have claudication, rest pain or non healing wounds. Clinically she has palpable pulses bilaterally -ABI/TBI today showed increase post intervention on LLE. RLE is stable. Bilateral lower extremity SFA stents patent with biphasic waveforms throughout - Aorto iliac Arterial duplex shows elevated velocities indicating recurrent stenosis in the left external iliac artery and stents but velocities are lower than at time of her previous intervention - She additionally was noted to have some increased velocities in her right external iliac artery but ABI stable, clinically palpable pulses and asymptomatic - Reviewed her duplex with Dr.  Clark and he is agreeable that we can repeat her studies in 6 months - Continue current therapy statin, aspirin, and plavix - I advised her to follow up sooner if she develops claudication, rest pain or a non healing wound - she will return in 6 months with Aorto iliac duplex and ABIs   Karoline Caldwell, PA-C [facility] [phone]  Clinic MD:  Dr. Carlis Abbott

## 2019-10-18 ENCOUNTER — Other Ambulatory Visit: Payer: Self-pay | Admitting: *Deleted

## 2019-10-18 DIAGNOSIS — D631 Anemia in chronic kidney disease: Secondary | ICD-10-CM | POA: Diagnosis not present

## 2019-10-18 DIAGNOSIS — Z992 Dependence on renal dialysis: Secondary | ICD-10-CM | POA: Diagnosis not present

## 2019-10-18 DIAGNOSIS — E1122 Type 2 diabetes mellitus with diabetic chronic kidney disease: Secondary | ICD-10-CM | POA: Diagnosis not present

## 2019-10-18 DIAGNOSIS — D509 Iron deficiency anemia, unspecified: Secondary | ICD-10-CM | POA: Diagnosis not present

## 2019-10-18 DIAGNOSIS — K311 Adult hypertrophic pyloric stenosis: Secondary | ICD-10-CM | POA: Diagnosis not present

## 2019-10-18 DIAGNOSIS — N2581 Secondary hyperparathyroidism of renal origin: Secondary | ICD-10-CM | POA: Diagnosis not present

## 2019-10-18 DIAGNOSIS — M25569 Pain in unspecified knee: Secondary | ICD-10-CM

## 2019-10-18 DIAGNOSIS — N186 End stage renal disease: Secondary | ICD-10-CM | POA: Diagnosis not present

## 2019-10-18 DIAGNOSIS — K3184 Gastroparesis: Secondary | ICD-10-CM | POA: Diagnosis not present

## 2019-10-18 DIAGNOSIS — I739 Peripheral vascular disease, unspecified: Secondary | ICD-10-CM

## 2019-10-20 DIAGNOSIS — N2581 Secondary hyperparathyroidism of renal origin: Secondary | ICD-10-CM | POA: Diagnosis not present

## 2019-10-20 DIAGNOSIS — N186 End stage renal disease: Secondary | ICD-10-CM | POA: Diagnosis not present

## 2019-10-20 DIAGNOSIS — D509 Iron deficiency anemia, unspecified: Secondary | ICD-10-CM | POA: Diagnosis not present

## 2019-10-20 DIAGNOSIS — D631 Anemia in chronic kidney disease: Secondary | ICD-10-CM | POA: Diagnosis not present

## 2019-10-20 DIAGNOSIS — Z992 Dependence on renal dialysis: Secondary | ICD-10-CM | POA: Diagnosis not present

## 2019-10-20 DIAGNOSIS — E1122 Type 2 diabetes mellitus with diabetic chronic kidney disease: Secondary | ICD-10-CM | POA: Diagnosis not present

## 2019-10-23 DIAGNOSIS — Z992 Dependence on renal dialysis: Secondary | ICD-10-CM | POA: Diagnosis not present

## 2019-10-23 DIAGNOSIS — D631 Anemia in chronic kidney disease: Secondary | ICD-10-CM | POA: Diagnosis not present

## 2019-10-23 DIAGNOSIS — N2581 Secondary hyperparathyroidism of renal origin: Secondary | ICD-10-CM | POA: Diagnosis not present

## 2019-10-23 DIAGNOSIS — N186 End stage renal disease: Secondary | ICD-10-CM | POA: Diagnosis not present

## 2019-10-23 DIAGNOSIS — E1122 Type 2 diabetes mellitus with diabetic chronic kidney disease: Secondary | ICD-10-CM | POA: Diagnosis not present

## 2019-10-23 DIAGNOSIS — D509 Iron deficiency anemia, unspecified: Secondary | ICD-10-CM | POA: Diagnosis not present

## 2019-10-25 DIAGNOSIS — N186 End stage renal disease: Secondary | ICD-10-CM | POA: Diagnosis not present

## 2019-10-25 DIAGNOSIS — Z992 Dependence on renal dialysis: Secondary | ICD-10-CM | POA: Diagnosis not present

## 2019-10-25 DIAGNOSIS — D509 Iron deficiency anemia, unspecified: Secondary | ICD-10-CM | POA: Diagnosis not present

## 2019-10-25 DIAGNOSIS — D631 Anemia in chronic kidney disease: Secondary | ICD-10-CM | POA: Diagnosis not present

## 2019-10-25 DIAGNOSIS — E1122 Type 2 diabetes mellitus with diabetic chronic kidney disease: Secondary | ICD-10-CM | POA: Diagnosis not present

## 2019-10-25 DIAGNOSIS — N2581 Secondary hyperparathyroidism of renal origin: Secondary | ICD-10-CM | POA: Diagnosis not present

## 2019-10-27 DIAGNOSIS — N186 End stage renal disease: Secondary | ICD-10-CM | POA: Diagnosis not present

## 2019-10-27 DIAGNOSIS — D631 Anemia in chronic kidney disease: Secondary | ICD-10-CM | POA: Diagnosis not present

## 2019-10-27 DIAGNOSIS — Z992 Dependence on renal dialysis: Secondary | ICD-10-CM | POA: Diagnosis not present

## 2019-10-27 DIAGNOSIS — N2581 Secondary hyperparathyroidism of renal origin: Secondary | ICD-10-CM | POA: Diagnosis not present

## 2019-10-27 DIAGNOSIS — E1122 Type 2 diabetes mellitus with diabetic chronic kidney disease: Secondary | ICD-10-CM | POA: Diagnosis not present

## 2019-10-27 DIAGNOSIS — D509 Iron deficiency anemia, unspecified: Secondary | ICD-10-CM | POA: Diagnosis not present

## 2019-10-30 DIAGNOSIS — E1122 Type 2 diabetes mellitus with diabetic chronic kidney disease: Secondary | ICD-10-CM | POA: Diagnosis not present

## 2019-10-30 DIAGNOSIS — N186 End stage renal disease: Secondary | ICD-10-CM | POA: Diagnosis not present

## 2019-10-30 DIAGNOSIS — D631 Anemia in chronic kidney disease: Secondary | ICD-10-CM | POA: Diagnosis not present

## 2019-10-30 DIAGNOSIS — Z992 Dependence on renal dialysis: Secondary | ICD-10-CM | POA: Diagnosis not present

## 2019-10-30 DIAGNOSIS — N2581 Secondary hyperparathyroidism of renal origin: Secondary | ICD-10-CM | POA: Diagnosis not present

## 2019-10-30 DIAGNOSIS — D509 Iron deficiency anemia, unspecified: Secondary | ICD-10-CM | POA: Diagnosis not present

## 2019-10-31 DIAGNOSIS — E1122 Type 2 diabetes mellitus with diabetic chronic kidney disease: Secondary | ICD-10-CM | POA: Diagnosis not present

## 2019-10-31 DIAGNOSIS — Z992 Dependence on renal dialysis: Secondary | ICD-10-CM | POA: Diagnosis not present

## 2019-10-31 DIAGNOSIS — N186 End stage renal disease: Secondary | ICD-10-CM | POA: Diagnosis not present

## 2019-10-31 DIAGNOSIS — N2581 Secondary hyperparathyroidism of renal origin: Secondary | ICD-10-CM | POA: Diagnosis not present

## 2019-10-31 DIAGNOSIS — D509 Iron deficiency anemia, unspecified: Secondary | ICD-10-CM | POA: Diagnosis not present

## 2019-10-31 DIAGNOSIS — D631 Anemia in chronic kidney disease: Secondary | ICD-10-CM | POA: Diagnosis not present

## 2019-11-06 DIAGNOSIS — Z992 Dependence on renal dialysis: Secondary | ICD-10-CM | POA: Diagnosis not present

## 2019-11-06 DIAGNOSIS — N2581 Secondary hyperparathyroidism of renal origin: Secondary | ICD-10-CM | POA: Diagnosis not present

## 2019-11-06 DIAGNOSIS — N186 End stage renal disease: Secondary | ICD-10-CM | POA: Diagnosis not present

## 2019-11-09 DIAGNOSIS — Z992 Dependence on renal dialysis: Secondary | ICD-10-CM | POA: Diagnosis not present

## 2019-11-09 DIAGNOSIS — N2581 Secondary hyperparathyroidism of renal origin: Secondary | ICD-10-CM | POA: Diagnosis not present

## 2019-11-09 DIAGNOSIS — N186 End stage renal disease: Secondary | ICD-10-CM | POA: Diagnosis not present

## 2019-11-12 DIAGNOSIS — Z992 Dependence on renal dialysis: Secondary | ICD-10-CM | POA: Diagnosis not present

## 2019-11-12 DIAGNOSIS — E1122 Type 2 diabetes mellitus with diabetic chronic kidney disease: Secondary | ICD-10-CM | POA: Diagnosis not present

## 2019-11-12 DIAGNOSIS — N186 End stage renal disease: Secondary | ICD-10-CM | POA: Diagnosis not present

## 2019-11-13 DIAGNOSIS — Z992 Dependence on renal dialysis: Secondary | ICD-10-CM | POA: Diagnosis not present

## 2019-11-13 DIAGNOSIS — D509 Iron deficiency anemia, unspecified: Secondary | ICD-10-CM | POA: Diagnosis not present

## 2019-11-13 DIAGNOSIS — N186 End stage renal disease: Secondary | ICD-10-CM | POA: Diagnosis not present

## 2019-11-13 DIAGNOSIS — D631 Anemia in chronic kidney disease: Secondary | ICD-10-CM | POA: Diagnosis not present

## 2019-11-13 DIAGNOSIS — N2581 Secondary hyperparathyroidism of renal origin: Secondary | ICD-10-CM | POA: Diagnosis not present

## 2019-11-14 ENCOUNTER — Other Ambulatory Visit: Payer: Self-pay

## 2019-11-14 ENCOUNTER — Encounter (INDEPENDENT_AMBULATORY_CARE_PROVIDER_SITE_OTHER): Payer: Medicare Other | Admitting: Ophthalmology

## 2019-11-14 DIAGNOSIS — E103313 Type 1 diabetes mellitus with moderate nonproliferative diabetic retinopathy with macular edema, bilateral: Secondary | ICD-10-CM | POA: Diagnosis not present

## 2019-11-14 DIAGNOSIS — H35033 Hypertensive retinopathy, bilateral: Secondary | ICD-10-CM

## 2019-11-14 DIAGNOSIS — I10 Essential (primary) hypertension: Secondary | ICD-10-CM

## 2019-11-14 DIAGNOSIS — H43813 Vitreous degeneration, bilateral: Secondary | ICD-10-CM

## 2019-11-14 DIAGNOSIS — E10311 Type 1 diabetes mellitus with unspecified diabetic retinopathy with macular edema: Secondary | ICD-10-CM | POA: Diagnosis not present

## 2019-11-15 DIAGNOSIS — D631 Anemia in chronic kidney disease: Secondary | ICD-10-CM | POA: Diagnosis not present

## 2019-11-15 DIAGNOSIS — N186 End stage renal disease: Secondary | ICD-10-CM | POA: Diagnosis not present

## 2019-11-15 DIAGNOSIS — Z961 Presence of intraocular lens: Secondary | ICD-10-CM | POA: Diagnosis not present

## 2019-11-15 DIAGNOSIS — D509 Iron deficiency anemia, unspecified: Secondary | ICD-10-CM | POA: Diagnosis not present

## 2019-11-15 DIAGNOSIS — Z992 Dependence on renal dialysis: Secondary | ICD-10-CM | POA: Diagnosis not present

## 2019-11-15 DIAGNOSIS — H11442 Conjunctival cysts, left eye: Secondary | ICD-10-CM | POA: Diagnosis not present

## 2019-11-15 DIAGNOSIS — N2581 Secondary hyperparathyroidism of renal origin: Secondary | ICD-10-CM | POA: Diagnosis not present

## 2019-11-17 DIAGNOSIS — N186 End stage renal disease: Secondary | ICD-10-CM | POA: Diagnosis not present

## 2019-11-17 DIAGNOSIS — N2581 Secondary hyperparathyroidism of renal origin: Secondary | ICD-10-CM | POA: Diagnosis not present

## 2019-11-17 DIAGNOSIS — Z992 Dependence on renal dialysis: Secondary | ICD-10-CM | POA: Diagnosis not present

## 2019-11-17 DIAGNOSIS — D509 Iron deficiency anemia, unspecified: Secondary | ICD-10-CM | POA: Diagnosis not present

## 2019-11-17 DIAGNOSIS — D631 Anemia in chronic kidney disease: Secondary | ICD-10-CM | POA: Diagnosis not present

## 2019-11-20 DIAGNOSIS — N186 End stage renal disease: Secondary | ICD-10-CM | POA: Diagnosis not present

## 2019-11-20 DIAGNOSIS — Z992 Dependence on renal dialysis: Secondary | ICD-10-CM | POA: Diagnosis not present

## 2019-11-20 DIAGNOSIS — N2581 Secondary hyperparathyroidism of renal origin: Secondary | ICD-10-CM | POA: Diagnosis not present

## 2019-11-20 DIAGNOSIS — D509 Iron deficiency anemia, unspecified: Secondary | ICD-10-CM | POA: Diagnosis not present

## 2019-11-20 DIAGNOSIS — D631 Anemia in chronic kidney disease: Secondary | ICD-10-CM | POA: Diagnosis not present

## 2019-11-21 DIAGNOSIS — H11442 Conjunctival cysts, left eye: Secondary | ICD-10-CM | POA: Diagnosis not present

## 2019-11-22 DIAGNOSIS — N186 End stage renal disease: Secondary | ICD-10-CM | POA: Diagnosis not present

## 2019-11-22 DIAGNOSIS — D509 Iron deficiency anemia, unspecified: Secondary | ICD-10-CM | POA: Diagnosis not present

## 2019-11-22 DIAGNOSIS — Z20822 Contact with and (suspected) exposure to covid-19: Secondary | ICD-10-CM | POA: Diagnosis not present

## 2019-11-22 DIAGNOSIS — N2581 Secondary hyperparathyroidism of renal origin: Secondary | ICD-10-CM | POA: Diagnosis not present

## 2019-11-22 DIAGNOSIS — Z01818 Encounter for other preprocedural examination: Secondary | ICD-10-CM | POA: Diagnosis not present

## 2019-11-22 DIAGNOSIS — Z992 Dependence on renal dialysis: Secondary | ICD-10-CM | POA: Diagnosis not present

## 2019-11-22 DIAGNOSIS — D631 Anemia in chronic kidney disease: Secondary | ICD-10-CM | POA: Diagnosis not present

## 2019-11-27 DIAGNOSIS — N2581 Secondary hyperparathyroidism of renal origin: Secondary | ICD-10-CM | POA: Diagnosis not present

## 2019-11-27 DIAGNOSIS — D631 Anemia in chronic kidney disease: Secondary | ICD-10-CM | POA: Diagnosis not present

## 2019-11-27 DIAGNOSIS — N186 End stage renal disease: Secondary | ICD-10-CM | POA: Diagnosis not present

## 2019-11-27 DIAGNOSIS — D509 Iron deficiency anemia, unspecified: Secondary | ICD-10-CM | POA: Diagnosis not present

## 2019-11-27 DIAGNOSIS — Z992 Dependence on renal dialysis: Secondary | ICD-10-CM | POA: Diagnosis not present

## 2019-11-29 DIAGNOSIS — E119 Type 2 diabetes mellitus without complications: Secondary | ICD-10-CM | POA: Diagnosis not present

## 2019-11-29 DIAGNOSIS — R112 Nausea with vomiting, unspecified: Secondary | ICD-10-CM | POA: Diagnosis not present

## 2019-11-29 DIAGNOSIS — K449 Diaphragmatic hernia without obstruction or gangrene: Secondary | ICD-10-CM | POA: Diagnosis not present

## 2019-12-01 DIAGNOSIS — Z992 Dependence on renal dialysis: Secondary | ICD-10-CM | POA: Diagnosis not present

## 2019-12-01 DIAGNOSIS — D509 Iron deficiency anemia, unspecified: Secondary | ICD-10-CM | POA: Diagnosis not present

## 2019-12-01 DIAGNOSIS — D631 Anemia in chronic kidney disease: Secondary | ICD-10-CM | POA: Diagnosis not present

## 2019-12-01 DIAGNOSIS — N186 End stage renal disease: Secondary | ICD-10-CM | POA: Diagnosis not present

## 2019-12-01 DIAGNOSIS — N2581 Secondary hyperparathyroidism of renal origin: Secondary | ICD-10-CM | POA: Diagnosis not present

## 2019-12-03 DIAGNOSIS — N186 End stage renal disease: Secondary | ICD-10-CM | POA: Diagnosis not present

## 2019-12-03 DIAGNOSIS — D631 Anemia in chronic kidney disease: Secondary | ICD-10-CM | POA: Diagnosis not present

## 2019-12-03 DIAGNOSIS — D509 Iron deficiency anemia, unspecified: Secondary | ICD-10-CM | POA: Diagnosis not present

## 2019-12-03 DIAGNOSIS — Z992 Dependence on renal dialysis: Secondary | ICD-10-CM | POA: Diagnosis not present

## 2019-12-03 DIAGNOSIS — N2581 Secondary hyperparathyroidism of renal origin: Secondary | ICD-10-CM | POA: Diagnosis not present

## 2019-12-05 DIAGNOSIS — D509 Iron deficiency anemia, unspecified: Secondary | ICD-10-CM | POA: Diagnosis not present

## 2019-12-05 DIAGNOSIS — D631 Anemia in chronic kidney disease: Secondary | ICD-10-CM | POA: Diagnosis not present

## 2019-12-05 DIAGNOSIS — N2581 Secondary hyperparathyroidism of renal origin: Secondary | ICD-10-CM | POA: Diagnosis not present

## 2019-12-05 DIAGNOSIS — Z992 Dependence on renal dialysis: Secondary | ICD-10-CM | POA: Diagnosis not present

## 2019-12-05 DIAGNOSIS — N186 End stage renal disease: Secondary | ICD-10-CM | POA: Diagnosis not present

## 2019-12-08 DIAGNOSIS — N2581 Secondary hyperparathyroidism of renal origin: Secondary | ICD-10-CM | POA: Diagnosis not present

## 2019-12-08 DIAGNOSIS — D509 Iron deficiency anemia, unspecified: Secondary | ICD-10-CM | POA: Diagnosis not present

## 2019-12-08 DIAGNOSIS — D631 Anemia in chronic kidney disease: Secondary | ICD-10-CM | POA: Diagnosis not present

## 2019-12-08 DIAGNOSIS — N186 End stage renal disease: Secondary | ICD-10-CM | POA: Diagnosis not present

## 2019-12-08 DIAGNOSIS — Z992 Dependence on renal dialysis: Secondary | ICD-10-CM | POA: Diagnosis not present

## 2019-12-11 DIAGNOSIS — N2581 Secondary hyperparathyroidism of renal origin: Secondary | ICD-10-CM | POA: Diagnosis not present

## 2019-12-11 DIAGNOSIS — D631 Anemia in chronic kidney disease: Secondary | ICD-10-CM | POA: Diagnosis not present

## 2019-12-11 DIAGNOSIS — Z992 Dependence on renal dialysis: Secondary | ICD-10-CM | POA: Diagnosis not present

## 2019-12-11 DIAGNOSIS — D509 Iron deficiency anemia, unspecified: Secondary | ICD-10-CM | POA: Diagnosis not present

## 2019-12-11 DIAGNOSIS — N186 End stage renal disease: Secondary | ICD-10-CM | POA: Diagnosis not present

## 2019-12-12 ENCOUNTER — Encounter (INDEPENDENT_AMBULATORY_CARE_PROVIDER_SITE_OTHER): Payer: Medicare Other | Admitting: Ophthalmology

## 2019-12-12 DIAGNOSIS — Z992 Dependence on renal dialysis: Secondary | ICD-10-CM | POA: Diagnosis not present

## 2019-12-12 DIAGNOSIS — E1122 Type 2 diabetes mellitus with diabetic chronic kidney disease: Secondary | ICD-10-CM | POA: Diagnosis not present

## 2019-12-12 DIAGNOSIS — N186 End stage renal disease: Secondary | ICD-10-CM | POA: Diagnosis not present

## 2019-12-13 DIAGNOSIS — N2581 Secondary hyperparathyroidism of renal origin: Secondary | ICD-10-CM | POA: Diagnosis not present

## 2019-12-13 DIAGNOSIS — D509 Iron deficiency anemia, unspecified: Secondary | ICD-10-CM | POA: Diagnosis not present

## 2019-12-13 DIAGNOSIS — N186 End stage renal disease: Secondary | ICD-10-CM | POA: Diagnosis not present

## 2019-12-13 DIAGNOSIS — Z992 Dependence on renal dialysis: Secondary | ICD-10-CM | POA: Diagnosis not present

## 2019-12-13 DIAGNOSIS — D631 Anemia in chronic kidney disease: Secondary | ICD-10-CM | POA: Diagnosis not present

## 2019-12-15 DIAGNOSIS — D509 Iron deficiency anemia, unspecified: Secondary | ICD-10-CM | POA: Diagnosis not present

## 2019-12-15 DIAGNOSIS — N186 End stage renal disease: Secondary | ICD-10-CM | POA: Diagnosis not present

## 2019-12-15 DIAGNOSIS — D631 Anemia in chronic kidney disease: Secondary | ICD-10-CM | POA: Diagnosis not present

## 2019-12-15 DIAGNOSIS — Z992 Dependence on renal dialysis: Secondary | ICD-10-CM | POA: Diagnosis not present

## 2019-12-15 DIAGNOSIS — N2581 Secondary hyperparathyroidism of renal origin: Secondary | ICD-10-CM | POA: Diagnosis not present

## 2019-12-17 ENCOUNTER — Ambulatory Visit: Payer: Medicare Other | Attending: Physician Assistant | Admitting: Physician Assistant

## 2019-12-17 ENCOUNTER — Other Ambulatory Visit (HOSPITAL_COMMUNITY)
Admission: RE | Admit: 2019-12-17 | Discharge: 2019-12-17 | Disposition: A | Discharge status: Home / Self Care | Payer: Medicare Other | Source: Ambulatory Visit | Attending: Physician Assistant | Admitting: Physician Assistant

## 2019-12-17 ENCOUNTER — Other Ambulatory Visit: Payer: Self-pay

## 2019-12-17 VITALS — BP 174/77 | HR 90 | Temp 98.2°F | Resp 18 | Ht 65.0 in | Wt 136.0 lb

## 2019-12-17 DIAGNOSIS — N760 Acute vaginitis: Secondary | ICD-10-CM

## 2019-12-17 DIAGNOSIS — B3731 Acute candidiasis of vulva and vagina: Secondary | ICD-10-CM

## 2019-12-17 DIAGNOSIS — B373 Candidiasis of vulva and vagina: Secondary | ICD-10-CM

## 2019-12-17 MED ORDER — FLUCONAZOLE 150 MG PO TABS: 150.0000 mg | ORAL_TABLET | Freq: Once | ORAL | 0 refills | Status: AC

## 2019-12-17 NOTE — Progress Notes (Signed)
Established Patient Office Visit  Subjective:  Patient ID: Susan Horton, female    DOB: 07-17-1970  Age: 49 y.o. MRN: [id number]  CC:  Chief Complaint  Patient presents with   Vaginal Itching    HPI Susan Horton reports that she has been having vaginal itching and burning for the last week, describes some episodes of clear discharge.  Reports that she had a change in soaps last week prior to the irritation beginning.  Denies dysuria, fever, abdominal pain, nausea or vomiting.  Has not tried anything for relief.  Patient is end-stage renal disease, dialysis ; T/R/S    Past Medical History:  Diagnosis Date   Abnormal stress test    Anemia    Angio-edema    Asthma    CAD (coronary artery disease)    DES to mid LAD July 2018, residual moderate RCA disease   Cataract    CKD (chronic kidney disease) stage 4, GFR 15-29 ml/min (HCC)    Dialysis T/Th/Sa   DVT (deep venous thrombosis) ([place])    1996 during pregnancy, 2015 left leg   Eczema    Essential hypertension 12/11/2015   Gastroparesis    GERD (gastroesophageal reflux disease)    Gluteal abscess 12/27/2018   Hidradenitis    Migraine    Neuropathy    Peripheral vascular disease (HCC)    blood clot in leg   Progressive angina ([place]) 06/05/2014   Chest pain   Type 2 diabetes mellitus ([place])    Type II diabetes mellitus ([place])    Urticaria     Past Surgical History:  Procedure Laterality Date   A/V FISTULAGRAM N/A 06/22/2017   Procedure: A/V FISTULAGRAM - left arm;  Surgeon: [name], MD;  Location: [facility];  Service: Cardiovascular;  Laterality: N/A;   ABDOMINAL AORTOGRAM W/LOWER EXTREMITY Bilateral 12/14/2018   Procedure: ABDOMINAL AORTOGRAM W/LOWER EXTREMITY;  Surgeon: [name], MD;  Location: [facility];  Service: Cardiovascular;  Laterality: Bilateral;   ABDOMINAL AORTOGRAM W/LOWER EXTREMITY N/A 04/25/2019   Procedure: ABDOMINAL AORTOGRAM W/LOWER  EXTREMITY;  Surgeon: [name], MD;  Location: [facility];  Service: Cardiovascular;  Laterality: N/A;   ABDOMINAL AORTOGRAM W/LOWER EXTREMITY Left 07/18/2019   Procedure: ABDOMINAL AORTOGRAM W/LOWER EXTREMITY;  Surgeon: [name], MD;  Location: [facility];  Service: Cardiovascular;  Laterality: Left;   ABDOMINOPLASTY     ADENOIDECTOMY     APPENDECTOMY  1995   AV FISTULA PLACEMENT Left 02/01/2017   Procedure: ARTERIOVENOUS BRACHIOCEPHALIC (AV) FISTULA CREATION;  Surgeon: [name], MD;  Location: [facility];  Service: Vascular;  Laterality: Left;   [place] INTERVENTION N/A 08/05/2016   Procedure: Coronary Stent Intervention;  Surgeon: [name], MD;  Location: [facility];  Service: Cardiovascular;  Laterality: N/A;   EXPLORATORY LAPAROTOMY  08/14/2005   lysis of adhesions, drainage of tubo-ovarian abscess   FISTULA SUPERFICIALIZATION Left 09/14/2017   Procedure: FISTULA SUPERFICIALIZATION ARTERIOVENOUS FISTULA LEFT ARM;  Surgeon: [name], MD;  Location: [facility];  Service: Vascular;  Laterality: Left;   FLEXIBLE SIGMOIDOSCOPY N/A 01/04/2019   Procedure: FLEXIBLE SIGMOIDOSCOPY;  Surgeon: Clarene Essex, MD;  Location: [facility];  Service: Endoscopy;  Laterality: N/A;   HYDRADENITIS EXCISION  02/08/2011   Procedure: EXCISION HYDRADENITIS GROIN;  Surgeon: [name], MD;  Location: [facility];  Service:  General;  Laterality: N/A;  Excisioin of Hidradenitis Left groin   INCISION AND DRAINAGE ABSCESS N/A 01/11/2019   Procedure: INCISION AND DRAINAGE SACRAL ABSCESS;  Surgeon: Georganna Skeans, MD;  Location: [facility];  Service: General;  Laterality: N/A;   INGUINAL HIDRADENITIS EXCISION  07/06/2010   bilateral   INSERTION OF DIALYSIS CATHETER N/A 09/14/2017   Procedure: INSERTION OF TUNNELED DIALYSIS CATHETER Right Internal Jugular;   Surgeon: [name], MD;  Location: [facility];  Service: Vascular;  Laterality: N/A;   IR FLUORO GUIDE CV LINE RIGHT  03/01/2019   IR US GUIDE BX ASP/DRAIN  01/01/2019   IR US GUIDE VASC ACCESS RIGHT  03/01/2019   LEFT HEART CATH AND CORONARY ANGIOGRAPHY N/A 08/05/2016   Procedure: Left Heart Cath and Coronary Angiography;  Surgeon: [name], MD;  Location: [facility];  Service: Cardiovascular;  Laterality: N/A;   PERIPHERAL VASCULAR INTERVENTION Left 12/14/2018   Procedure: PERIPHERAL VASCULAR INTERVENTION;  Surgeon: [name], MD;  Location: [facility];  Service: Cardiovascular;  Laterality: Left;  SFA   PERIPHERAL VASCULAR INTERVENTION Left 04/25/2019   Procedure: PERIPHERAL VASCULAR INTERVENTION;  Surgeon: [name], MD;  Location: [facility];  Service: Cardiovascular;  Laterality: Left;  right superficial femoral, and left iliac   PERIPHERAL VASCULAR INTERVENTION Left 07/18/2019   Procedure: PERIPHERAL VASCULAR INTERVENTION;  Surgeon: [name], MD;  Location: [facility];  Service: Cardiovascular;  Laterality: Left;  external iliac   REDUCTION MAMMAPLASTY  2002   TONSILLECTOMY     TUBAL LIGATION  1996   VAGINAL HYSTERECTOMY  08/04/2005   and cysto    Family History  Problem Relation Age of Onset   Allergic rhinitis Mother    Hypertension Father    Allergic rhinitis Father    Hypertension Sister    Allergic rhinitis Sister    Hypertension Brother    Allergic rhinitis Brother    Breast cancer Cousin        x2    Social History   Socioeconomic History   Marital status: Single    Spouse name: Not on file   Number of children: 3   Years of education: 14   Highest education level: Not on file  Occupational History   Occupation: Curator  Tobacco Use   Smoking status: Former Smoker    Years: 20.00    Types: Cigarettes    Quit date: 11/2016    Years since quitting:  3.1   Smokeless tobacco: Never Used  Scientific laboratory technician Use: Never used  Substance and Sexual Activity   Alcohol use: No    Alcohol/week: 0.0 standard drinks   Drug use: No   Sexual activity: Not on file  Other Topics Concern   Not on file  Social History Narrative   Lives with daughter in a 2 story home.  Has 3 children.  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  Education: college.    Social Determinants of Health   Financial Resource Strain:    Difficulty of Paying Living Expenses: Not on file  Food Insecurity:    Worried About Charity fundraiser in the Last Year: Not on file   [employer] of Food in the Last Year: Not on file  Transportation Needs:    Lack of Transportation (Medical): Not on file   Lack of Transportation (Non-Medical): Not on file  Physical Activity:    Days  of Exercise per Week: Not on file   Minutes of Exercise per Session: Not on file  Stress:    Feeling of Stress : Not on file  Social Connections:    Frequency of Communication with Friends and Family: Not on file   Frequency of Social Gatherings with Friends and Family: Not on file   Attends Religious Services: Not on file   Active Member of [place] or Organizations: Not on file   Attends Archivist Meetings: Not on file   Marital Status: Not on file  Intimate Partner Violence:    Fear of Current or Ex-Partner: Not on file   Emotionally Abused: Not on file   Physically Abused: Not on file   Sexually Abused: Not on file    Outpatient Medications Prior to Visit  Medication Sig Dispense Refill   albuterol (PROVENTIL HFA;VENTOLIN HFA) 108 (90 BASE) MCG/ACT inhaler Inhale 1-2 puffs into the lungs every 6 (six) hours as needed for wheezing or shortness of breath.      Alirocumab (PRALUENT) 75 MG/ML SOAJ Inject 75 mg into the skin every 14 (fourteen) days. 2 pen 11   amitriptyline (ELAVIL) 10 MG tablet Take 1 tablet (10 mg total) by mouth at  bedtime. 30 tablet 1   amLODipine (NORVASC) 10 MG tablet Take 1 tablet (10 mg total) by mouth daily. 90 tablet 3   aspirin 81 MG chewable tablet Chew 1 tablet (81 mg total) by mouth daily. 30 tablet 0   B Complex-C-Zn-Folic Acid (DIALYVITE 161 WITH ZINC) 0.8 MG TABS Take 1 tablet by mouth daily.     calcitRIOL (ROCALTROL) 0.5 MCG capsule Take 1.5 mcg by mouth daily.     calcium acetate (PHOSLO) 667 MG capsule Take 1,334-2,001 mg by mouth See admin instructions. Take 2001 mg by mouth up to three times daily with meals and 1334 mg with snacks  10   clopidogrel (PLAVIX) 75 MG tablet Take 1 tablet (75 mg total) by mouth daily with breakfast. 90 tablet 3   doxercalciferol (HECTOROL) 4 MCG/2ML injection Doxercalciferol (Hectorol)     Fluticasone-Salmeterol (ADVAIR) 250-50 MCG/DOSE AEPB Inhale 1 puff into the lungs 2 (two) times daily. 60 each 3   heparin 1000 unit/mL SOLN injection Heparin Sodium (Porcine) 1,000 Units/mL Systemic     insulin aspart (NOVOLOG) 100 UNIT/ML injection 0-6 Units, Subcutaneous, 3 times daily with meals CBG < 70: Implement Hypoglycemia measures/call MD CBG 70 - 120: 0 units CBG 121 - 150: 0 units CBG 151 - 200: 1 unit CBG 201-250: 2 units CBG 251-300: 3 units CBG 301-350: 4 units CBG 351-400: 5 units CBG > 400: Give 6 units and call MD 10 mL 0   insulin glargine, 2 Unit Dial, (TOUJEO MAX SOLOSTAR) 300 UNIT/ML Solostar Pen Inject 54 Units into the skin at bedtime.      iron sucrose (VENOFER) 20 MG/ML injection Iron Sucrose (Venofer)     isosorbide mononitrate (IMDUR) 30 MG 24 hr tablet Take 0.5 tablets (15 mg total) by mouth daily. 45 tablet 3   methocarbamol (ROBAXIN) 500 MG tablet Take 1 tablet (500 mg total) by mouth 2 (two) times daily as needed for muscle spasms. 60 tablet 1   Methoxy PEG-Epoetin Beta (MIRCERA IJ) Inject into the skin.     metoCLOPramide (REGLAN) 5 MG tablet TAKE 1 TABLET BY MOUTH 4 TIMES DAILY BEFORE MEALS AND AT BEDTIME. (Patient taking  differently: Take 5 mg by mouth 4 (four) times daily -  before meals and at bedtime. )  90 tablet 0   metoprolol succinate (TOPROL-XL) 50 MG 24 hr tablet Take 75 mg (1.5 tablet) daily 135 tablet 3   montelukast (SINGULAIR) 10 MG tablet Take 1 tablet (10 mg total) by mouth at bedtime. 30 tablet 5   nitroGLYCERIN (NITROSTAT) 0.4 MG SL tablet Place 1 tablet (0.4 mg total) under the tongue every 5 (five) minutes as needed for chest pain. 25 tablet 2   ondansetron (ZOFRAN ODT) 4 MG disintegrating tablet Take 1 tablet (4 mg total) by mouth every 8 (eight) hours as needed for nausea or vomiting. 20 tablet 0   pantoprazole (PROTONIX) 40 MG tablet Take 1 tablet (40 mg total) by mouth daily. 30 tablet 7   trimethoprim-polymyxin b (POLYTRIM) ophthalmic solution Place 1 drop into both eyes See admin instructions. Instill one drop into both eyes four times daily for 2 days after each monthly eye injection  12   atorvastatin (LIPITOR) 40 MG tablet Take 40 mg by mouth daily. (Patient not taking: Reported on 12/17/2019)     hydrALAZINE (APRESOLINE) 25 MG tablet Take 1 tablet (25 mg total) by mouth 3 (three) times daily. 270 tablet 3   No facility-administered medications prior to visit.    Allergies  Allergen Reactions   Penicillins Anaphylaxis, Itching, Swelling, Rash and Other (See Comments)    Swelling of throat & whole mouth  Has patient had a PCN reaction causing immediate rash, facial/tongue/throat swelling, SOB or lightheadedness with hypotension: Yes Has patient had a PCN reaction causing severe rash involving mucus membranes or skin necrosis: No Has patient had a PCN reaction that required hospitalization: No Has patient had a PCN reaction occurring within the last 10 years: No If all of the above answers are "NO", then may proceed with Cephalosporin use.   Repatha [Evolocumab] Itching   Other Itching   Lisinopril Cough    Other reaction(s): Unknown    ROS Review of Systems    Constitutional: Negative for chills and fever.  HENT: Negative.   Eyes: Negative.   Respiratory: Negative.   Cardiovascular: Negative.   Gastrointestinal: Negative for abdominal pain, diarrhea, nausea and vomiting.  Endocrine: Negative.   Genitourinary: Positive for vaginal discharge. Negative for dysuria, flank pain, frequency, genital sores and urgency.  Musculoskeletal: Negative for back pain.  Allergic/Immunologic: Negative.   Neurological: Negative.   Hematological: Negative.   Psychiatric/Behavioral: Negative.       Objective:    Physical Exam Vitals and nursing note reviewed.  Constitutional:      Appearance: Normal appearance.  HENT:     Head: Normocephalic and atraumatic.     Right Ear: External ear normal.     Left Ear: External ear normal.     Nose: Nose normal.     Mouth/Throat:     Mouth: Mucous membranes are moist.     Pharynx: Oropharynx is clear.  Eyes:     Conjunctiva/sclera: Conjunctivae normal.     Pupils: Pupils are equal, round, and reactive to light.  Cardiovascular:     Rate and Rhythm: Normal rate and regular rhythm.     Heart sounds: Murmur heard.   Pulmonary:     Effort: Pulmonary effort is normal.     Breath sounds: Normal breath sounds.  Abdominal:     General: Abdomen is flat.     Palpations: Abdomen is soft.     Tenderness: There is no abdominal tenderness. There is no right CVA tenderness or left CVA tenderness.  Musculoskeletal:  General: Normal range of motion.     Cervical back: Normal range of motion and neck supple.  Skin:    General: Skin is warm and dry.  Neurological:     General: No focal deficit present.     Mental Status: She is alert and oriented to person, place, and time.  Psychiatric:        Mood and Affect: Mood normal.        Behavior: Behavior normal.        Thought Content: Thought content normal.        Judgment: Judgment normal.     BP (!) 174/77 (BP Location: Left Arm, Patient Position: Sitting,  Cuff Size: Normal)    Pulse 90    Temp 98.2 F (36.8 C) (Oral)    Resp 18    Ht 5\' 5"  (1.651 m)    Wt 136 lb (61.7 kg)    SpO2 100%    BMI 22.63 kg/m  Wt Readings from Last 3 Encounters:  12/17/19 136 lb (61.7 kg)  10/16/19 143 lb 14.4 oz (65.3 kg)  10/10/19 146 lb (66.2 kg)     Health Maintenance Due  Topic Date Due   PNEUMOCOCCAL POLYSACCHARIDE VACCINE AGE 82-64 HIGH RISK  Never done   FOOT EXAM  Never done   COVID-19 Vaccine (1) Never done   TETANUS/TDAP  Never done   URINE MICROALBUMIN  12/10/2016   OPHTHALMOLOGY EXAM  02/10/2017   HEMOGLOBIN A1C  06/26/2019   INFLUENZA VACCINE  Never done    There are no preventive care reminders to display for this patient.  Lab Results  Component Value Date   TSH 2.870 04/12/2018   Lab Results  Component Value Date   WBC 10.0 06/08/2019   HGB 17.0 (H) 07/18/2019   HCT 50.0 (H) 07/18/2019   MCV 81.7 06/08/2019   PLT 376 06/08/2019   Lab Results  Component Value Date   NA 139 07/18/2019   K 3.8 07/18/2019   CO2 28 06/08/2019   GLUCOSE 112 (H) 07/18/2019   BUN 45 (H) 07/18/2019   CREATININE 6.30 (H) 07/18/2019   BILITOT 1.1 02/24/2019   ALKPHOS 93 02/24/2019   AST 16 02/24/2019   ALT 16 02/24/2019   PROT 7.6 02/24/2019   ALBUMIN 1.9 (L) 03/02/2019   CALCIUM 8.9 06/08/2019   ANIONGAP 15 06/08/2019   Lab Results  Component Value Date   CHOL 152 01/19/2019   Lab Results  Component Value Date   HDL 36 (L) 01/19/2019   Lab Results  Component Value Date   LDLCALC 90 01/19/2019   Lab Results  Component Value Date   TRIG 149 01/19/2019   Lab Results  Component Value Date   CHOLHDL 4.2 01/19/2019   Lab Results  Component Value Date   HGBA1C 7.5 (H) 12/26/2018      Assessment & Plan:   Problem List Items Addressed This Visit    None    Visit Diagnoses    Yeast infection of the vagina    -  Primary   Acute vaginitis       Relevant Orders   Cervicovaginal ancillary only    1. Yeast infection of  the vagina Reviewed up-to-date literature, okay to take Diflucan once, patient encouraged to call office before end of week if no improvement. Patient education given on vaginal yeast infections - fluconazole (DIFLUCAN) 150 MG tablet; Take 1 tablet (150 mg total) by mouth once for 1 dose.  Dispense: 1 tablet; Refill:  0  2. Acute vaginitis  - Cervicovaginal ancillary only   I have reviewed the patient's medical history (PMH, PSH, Social History, Family History, Medications, and allergies) , and have been updated if relevant. I spent 20 minutes reviewing chart and  face to face time with patient.     Meds ordered this encounter  Medications   fluconazole (DIFLUCAN) 150 MG tablet    Sig: Take 1 tablet (150 mg total) by mouth once for 1 dose.    Dispense:  1 tablet    Refill:  0    Order Specific Question:   Supervising Provider    Answer:   Elsie Stain [[id number]]    Follow-up: No follow-ups on file.    Loraine Grip Mayers, PA-C

## 2019-12-17 NOTE — Patient Instructions (Signed)
Please take the dose of Diflucan today.  Continue to avoid irritants.  Stay very well-hydrated.  Please call our office if you have not had an improvement before the end of the week.  I hope that you feel better soon, we will call you with your swab results  Kennieth Rad, PA-C Physician Assistant [facility] [email or web address]    Vaginal Yeast Infection, Adult  Vaginal yeast infection is a condition that causes vaginal discharge as well as soreness, swelling, and redness (inflammation) of the vagina. This is a common condition. Some women get this infection frequently. What are the causes? This condition is caused by a change in the normal balance of the yeast (candida) and bacteria that live in the vagina. This change causes an overgrowth of yeast, which causes the inflammation. What increases the risk? The condition is more likely to develop in women who:  Take antibiotic medicines.  Have diabetes.  Take birth control pills.  Are pregnant.  Douche often.  Have a weak body defense system (immune system).  Have been taking steroid medicines for a long time.  Frequently wear tight clothing. What are the signs or symptoms? Symptoms of this condition include:  White, thick, creamy vaginal discharge.  Swelling, itching, redness, and irritation of the vagina. The lips of the vagina (vulva) may be affected as well.  Pain or a burning feeling while urinating.  Pain during sex. How is this diagnosed? This condition is diagnosed based on:  Your medical history.  A physical exam.  A pelvic exam. Your health care provider will examine a sample of your vaginal discharge under a microscope. Your health care provider may send this sample for testing to confirm the diagnosis. How is this treated? This condition is treated with medicine. Medicines may be over-the-counter or prescription. You may be told to use one or  more of the following:  Medicine that is taken by mouth (orally).  Medicine that is applied as a cream (topically).  Medicine that is inserted directly into the vagina (suppository). Follow these instructions at home:  Lifestyle  Do not have sex until your health care provider approves. Tell your sex partner that you have a yeast infection. That person should go to his or her health care provider and ask if they should also be treated.  Do not wear tight clothes, such as pantyhose or tight pants.  Wear breathable cotton underwear. General instructions  Take or apply over-the-counter and prescription medicines only as told by your health care provider.  Eat more yogurt. This may help to keep your yeast infection from returning.  Do not use tampons until your health care provider approves.  Try taking a sitz bath to help with discomfort. This is a warm water bath that is taken while you are sitting down. The water should only come up to your hips and should cover your buttocks. Do this 3-4 times per day or as told by your health care provider.  Do not douche.  If you have diabetes, keep your blood sugar levels under control.  Keep all follow-up visits as told by your health care provider. This is important. Contact a health care provider if:  You have a fever.  Your symptoms go away and then return.  Your symptoms do not get better with treatment.  Your symptoms get worse.  You have new symptoms.  You develop blisters in or around your vagina.  You have blood coming from your vagina and it is not  your menstrual period.  You develop pain in your abdomen. Summary  Vaginal yeast infection is a condition that causes discharge as well as soreness, swelling, and redness (inflammation) of the vagina.  This condition is treated with medicine. Medicines may be over-the-counter or prescription.  Take or apply over-the-counter and prescription medicines only as told by your  health care provider.  Do not douche. Do not have sex or use tampons until your health care provider approves.  Contact a health care provider if your symptoms do not get better with treatment or your symptoms go away and then return. This information is not intended to replace advice given to you by your health care provider. Make sure you discuss any questions you have with your health care provider. Document Revised: 07/28/2018 Document Reviewed: 05/16/2017 Elsevier Patient Education  [place].

## 2019-12-17 NOTE — Progress Notes (Signed)
Patient has not eaten today and patient has not taken medication today. Patient denies pain at this time. Patient had a change in soaps last week and has discomfort since. Patient denies HA and dizziness.

## 2019-12-18 ENCOUNTER — Encounter: Payer: Self-pay | Admitting: Physician Assistant

## 2019-12-18 DIAGNOSIS — D509 Iron deficiency anemia, unspecified: Secondary | ICD-10-CM | POA: Diagnosis not present

## 2019-12-18 DIAGNOSIS — Z992 Dependence on renal dialysis: Secondary | ICD-10-CM | POA: Diagnosis not present

## 2019-12-18 DIAGNOSIS — D631 Anemia in chronic kidney disease: Secondary | ICD-10-CM | POA: Diagnosis not present

## 2019-12-18 DIAGNOSIS — N2581 Secondary hyperparathyroidism of renal origin: Secondary | ICD-10-CM | POA: Diagnosis not present

## 2019-12-18 DIAGNOSIS — N186 End stage renal disease: Secondary | ICD-10-CM | POA: Diagnosis not present

## 2019-12-18 LAB — CERVICOVAGINAL ANCILLARY ONLY
Bacterial Vaginitis (gardnerella): POSITIVE — AB
Candida Glabrata: NEGATIVE
Candida Vaginitis: NEGATIVE
Chlamydia: NEGATIVE
Comment: NEGATIVE
Comment: NEGATIVE
Comment: NEGATIVE
Comment: NEGATIVE
Comment: NEGATIVE
Comment: NORMAL
Neisseria Gonorrhea: NEGATIVE
Trichomonas: NEGATIVE

## 2019-12-18 MED ORDER — METRONIDAZOLE 500 MG PO TABS: 500.0000 mg | ORAL_TABLET | Freq: Two times a day (BID) | ORAL | 0 refills | Status: AC

## 2019-12-18 NOTE — Addendum Note (Signed)
Addended by: Kennieth Rad on: 12/18/2019 02:43 PM   Modules accepted: Orders

## 2019-12-19 ENCOUNTER — Telehealth: Payer: Self-pay | Admitting: *Deleted

## 2019-12-19 NOTE — Telephone Encounter (Signed)
Patient verified DOB Patient is aware of flagyl being approved to take while on dialysis. Patient advised to report any adverse effects immediately and to take medication when she returns home on the days of dialysis.

## 2019-12-19 NOTE — Telephone Encounter (Signed)
-----   Message from Kennieth Rad, [place] sent at 12/18/2019  2:43 PM EST ----- Please call patient and let her know that her swab was positive for bacterial vaginitis, I reviewed up-to-date material regarding taking metronidazole while on dialysis.  It is fine to use, making sure to report any adverse effects right away, on days that she has dialysis she should wait to take the metronidazole until after dialysis.  Prescription sent to the pharmacy

## 2019-12-20 DIAGNOSIS — N186 End stage renal disease: Secondary | ICD-10-CM | POA: Diagnosis not present

## 2019-12-20 DIAGNOSIS — D509 Iron deficiency anemia, unspecified: Secondary | ICD-10-CM | POA: Diagnosis not present

## 2019-12-20 DIAGNOSIS — D631 Anemia in chronic kidney disease: Secondary | ICD-10-CM | POA: Diagnosis not present

## 2019-12-20 DIAGNOSIS — N2581 Secondary hyperparathyroidism of renal origin: Secondary | ICD-10-CM | POA: Diagnosis not present

## 2019-12-20 DIAGNOSIS — Z992 Dependence on renal dialysis: Secondary | ICD-10-CM | POA: Diagnosis not present

## 2019-12-22 DIAGNOSIS — N2581 Secondary hyperparathyroidism of renal origin: Secondary | ICD-10-CM | POA: Diagnosis not present

## 2019-12-22 DIAGNOSIS — N186 End stage renal disease: Secondary | ICD-10-CM | POA: Diagnosis not present

## 2019-12-22 DIAGNOSIS — D509 Iron deficiency anemia, unspecified: Secondary | ICD-10-CM | POA: Diagnosis not present

## 2019-12-22 DIAGNOSIS — D631 Anemia in chronic kidney disease: Secondary | ICD-10-CM | POA: Diagnosis not present

## 2019-12-22 DIAGNOSIS — Z992 Dependence on renal dialysis: Secondary | ICD-10-CM | POA: Diagnosis not present

## 2019-12-25 DIAGNOSIS — Z992 Dependence on renal dialysis: Secondary | ICD-10-CM | POA: Diagnosis not present

## 2019-12-25 DIAGNOSIS — D631 Anemia in chronic kidney disease: Secondary | ICD-10-CM | POA: Diagnosis not present

## 2019-12-25 DIAGNOSIS — N2581 Secondary hyperparathyroidism of renal origin: Secondary | ICD-10-CM | POA: Diagnosis not present

## 2019-12-25 DIAGNOSIS — D509 Iron deficiency anemia, unspecified: Secondary | ICD-10-CM | POA: Diagnosis not present

## 2019-12-25 DIAGNOSIS — N186 End stage renal disease: Secondary | ICD-10-CM | POA: Diagnosis not present

## 2019-12-26 DIAGNOSIS — Z992 Dependence on renal dialysis: Secondary | ICD-10-CM | POA: Diagnosis not present

## 2019-12-26 DIAGNOSIS — D631 Anemia in chronic kidney disease: Secondary | ICD-10-CM | POA: Diagnosis not present

## 2019-12-26 DIAGNOSIS — N2581 Secondary hyperparathyroidism of renal origin: Secondary | ICD-10-CM | POA: Diagnosis not present

## 2019-12-26 DIAGNOSIS — D509 Iron deficiency anemia, unspecified: Secondary | ICD-10-CM | POA: Diagnosis not present

## 2019-12-26 DIAGNOSIS — N186 End stage renal disease: Secondary | ICD-10-CM | POA: Diagnosis not present

## 2020-01-01 DIAGNOSIS — D509 Iron deficiency anemia, unspecified: Secondary | ICD-10-CM | POA: Diagnosis not present

## 2020-01-01 DIAGNOSIS — D631 Anemia in chronic kidney disease: Secondary | ICD-10-CM | POA: Diagnosis not present

## 2020-01-01 DIAGNOSIS — N186 End stage renal disease: Secondary | ICD-10-CM | POA: Diagnosis not present

## 2020-01-01 DIAGNOSIS — Z992 Dependence on renal dialysis: Secondary | ICD-10-CM | POA: Diagnosis not present

## 2020-01-01 DIAGNOSIS — N2581 Secondary hyperparathyroidism of renal origin: Secondary | ICD-10-CM | POA: Diagnosis not present

## 2020-01-03 DIAGNOSIS — D509 Iron deficiency anemia, unspecified: Secondary | ICD-10-CM | POA: Diagnosis not present

## 2020-01-03 DIAGNOSIS — N186 End stage renal disease: Secondary | ICD-10-CM | POA: Diagnosis not present

## 2020-01-03 DIAGNOSIS — N2581 Secondary hyperparathyroidism of renal origin: Secondary | ICD-10-CM | POA: Diagnosis not present

## 2020-01-03 DIAGNOSIS — D631 Anemia in chronic kidney disease: Secondary | ICD-10-CM | POA: Diagnosis not present

## 2020-01-03 DIAGNOSIS — Z992 Dependence on renal dialysis: Secondary | ICD-10-CM | POA: Diagnosis not present

## 2020-01-06 DIAGNOSIS — N2581 Secondary hyperparathyroidism of renal origin: Secondary | ICD-10-CM | POA: Diagnosis not present

## 2020-01-06 DIAGNOSIS — D509 Iron deficiency anemia, unspecified: Secondary | ICD-10-CM | POA: Diagnosis not present

## 2020-01-06 DIAGNOSIS — N186 End stage renal disease: Secondary | ICD-10-CM | POA: Diagnosis not present

## 2020-01-06 DIAGNOSIS — D631 Anemia in chronic kidney disease: Secondary | ICD-10-CM | POA: Diagnosis not present

## 2020-01-06 DIAGNOSIS — Z992 Dependence on renal dialysis: Secondary | ICD-10-CM | POA: Diagnosis not present

## 2020-01-08 DIAGNOSIS — D631 Anemia in chronic kidney disease: Secondary | ICD-10-CM | POA: Diagnosis not present

## 2020-01-08 DIAGNOSIS — Z992 Dependence on renal dialysis: Secondary | ICD-10-CM | POA: Diagnosis not present

## 2020-01-08 DIAGNOSIS — D509 Iron deficiency anemia, unspecified: Secondary | ICD-10-CM | POA: Diagnosis not present

## 2020-01-08 DIAGNOSIS — N2581 Secondary hyperparathyroidism of renal origin: Secondary | ICD-10-CM | POA: Diagnosis not present

## 2020-01-08 DIAGNOSIS — N186 End stage renal disease: Secondary | ICD-10-CM | POA: Diagnosis not present

## 2020-01-09 ENCOUNTER — Other Ambulatory Visit: Payer: Self-pay | Admitting: Cardiovascular Disease

## 2020-01-10 DIAGNOSIS — D631 Anemia in chronic kidney disease: Secondary | ICD-10-CM | POA: Diagnosis not present

## 2020-01-10 DIAGNOSIS — Z992 Dependence on renal dialysis: Secondary | ICD-10-CM | POA: Diagnosis not present

## 2020-01-10 DIAGNOSIS — N2581 Secondary hyperparathyroidism of renal origin: Secondary | ICD-10-CM | POA: Diagnosis not present

## 2020-01-10 DIAGNOSIS — D509 Iron deficiency anemia, unspecified: Secondary | ICD-10-CM | POA: Diagnosis not present

## 2020-01-10 DIAGNOSIS — N186 End stage renal disease: Secondary | ICD-10-CM | POA: Diagnosis not present

## 2020-01-12 DIAGNOSIS — Z992 Dependence on renal dialysis: Secondary | ICD-10-CM | POA: Diagnosis not present

## 2020-01-12 DIAGNOSIS — E1122 Type 2 diabetes mellitus with diabetic chronic kidney disease: Secondary | ICD-10-CM | POA: Diagnosis not present

## 2020-01-12 DIAGNOSIS — N186 End stage renal disease: Secondary | ICD-10-CM | POA: Diagnosis not present

## 2020-01-13 DIAGNOSIS — N2581 Secondary hyperparathyroidism of renal origin: Secondary | ICD-10-CM | POA: Diagnosis not present

## 2020-01-13 DIAGNOSIS — D631 Anemia in chronic kidney disease: Secondary | ICD-10-CM | POA: Diagnosis not present

## 2020-01-13 DIAGNOSIS — N186 End stage renal disease: Secondary | ICD-10-CM | POA: Diagnosis not present

## 2020-01-13 DIAGNOSIS — Z992 Dependence on renal dialysis: Secondary | ICD-10-CM | POA: Diagnosis not present

## 2020-01-13 DIAGNOSIS — D509 Iron deficiency anemia, unspecified: Secondary | ICD-10-CM | POA: Diagnosis not present

## 2020-01-15 DIAGNOSIS — Z992 Dependence on renal dialysis: Secondary | ICD-10-CM | POA: Diagnosis not present

## 2020-01-15 DIAGNOSIS — D631 Anemia in chronic kidney disease: Secondary | ICD-10-CM | POA: Diagnosis not present

## 2020-01-15 DIAGNOSIS — N186 End stage renal disease: Secondary | ICD-10-CM | POA: Diagnosis not present

## 2020-01-15 DIAGNOSIS — N2581 Secondary hyperparathyroidism of renal origin: Secondary | ICD-10-CM | POA: Diagnosis not present

## 2020-01-15 DIAGNOSIS — D509 Iron deficiency anemia, unspecified: Secondary | ICD-10-CM | POA: Diagnosis not present

## 2020-01-17 DIAGNOSIS — Z992 Dependence on renal dialysis: Secondary | ICD-10-CM | POA: Diagnosis not present

## 2020-01-17 DIAGNOSIS — D631 Anemia in chronic kidney disease: Secondary | ICD-10-CM | POA: Diagnosis not present

## 2020-01-17 DIAGNOSIS — N186 End stage renal disease: Secondary | ICD-10-CM | POA: Diagnosis not present

## 2020-01-17 DIAGNOSIS — E1122 Type 2 diabetes mellitus with diabetic chronic kidney disease: Secondary | ICD-10-CM | POA: Diagnosis not present

## 2020-01-17 DIAGNOSIS — N2581 Secondary hyperparathyroidism of renal origin: Secondary | ICD-10-CM | POA: Diagnosis not present

## 2020-01-17 DIAGNOSIS — D509 Iron deficiency anemia, unspecified: Secondary | ICD-10-CM | POA: Diagnosis not present

## 2020-01-19 DIAGNOSIS — N186 End stage renal disease: Secondary | ICD-10-CM | POA: Diagnosis not present

## 2020-01-19 DIAGNOSIS — N2581 Secondary hyperparathyroidism of renal origin: Secondary | ICD-10-CM | POA: Diagnosis not present

## 2020-01-19 DIAGNOSIS — D631 Anemia in chronic kidney disease: Secondary | ICD-10-CM | POA: Diagnosis not present

## 2020-01-19 DIAGNOSIS — Z992 Dependence on renal dialysis: Secondary | ICD-10-CM | POA: Diagnosis not present

## 2020-01-19 DIAGNOSIS — D509 Iron deficiency anemia, unspecified: Secondary | ICD-10-CM | POA: Diagnosis not present

## 2020-01-21 ENCOUNTER — Telehealth: Payer: Self-pay | Admitting: Family Medicine

## 2020-01-21 MED ORDER — FLUCONAZOLE 150 MG PO TABS: 150.0000 mg | ORAL_TABLET | Freq: Once | ORAL | 0 refills | Status: AC

## 2020-01-21 NOTE — Telephone Encounter (Signed)
Pt is needing script for Diflucan medication.

## 2020-01-21 NOTE — Telephone Encounter (Signed)
Done

## 2020-01-21 NOTE — Telephone Encounter (Signed)
Medication Refill - Medication: Requesting one dose of Diflucan   Pt reports that Cari Mayers told her to call back and request this if needed, please advise   Has the patient contacted their pharmacy? Yes.   (Agent: If no, request that the patient contact the pharmacy for the refill.) (Agent: If yes, when and what did the pharmacy advise?)  Preferred Pharmacy (with phone number or street name):  [id number] - [place], [place]  [address] [place] [address]  Phone: [phone] Fax: [phone]     Agent: Please be advised that RX refills may take up to 3 business days. We ask that you follow-up with your pharmacy.

## 2020-01-22 DIAGNOSIS — D631 Anemia in chronic kidney disease: Secondary | ICD-10-CM | POA: Diagnosis not present

## 2020-01-22 DIAGNOSIS — Z992 Dependence on renal dialysis: Secondary | ICD-10-CM | POA: Diagnosis not present

## 2020-01-22 DIAGNOSIS — N186 End stage renal disease: Secondary | ICD-10-CM | POA: Diagnosis not present

## 2020-01-22 DIAGNOSIS — D509 Iron deficiency anemia, unspecified: Secondary | ICD-10-CM | POA: Diagnosis not present

## 2020-01-22 DIAGNOSIS — N2581 Secondary hyperparathyroidism of renal origin: Secondary | ICD-10-CM | POA: Diagnosis not present

## 2020-01-23 DIAGNOSIS — K3184 Gastroparesis: Secondary | ICD-10-CM | POA: Diagnosis not present

## 2020-01-24 DIAGNOSIS — Z992 Dependence on renal dialysis: Secondary | ICD-10-CM | POA: Diagnosis not present

## 2020-01-24 DIAGNOSIS — D509 Iron deficiency anemia, unspecified: Secondary | ICD-10-CM | POA: Diagnosis not present

## 2020-01-24 DIAGNOSIS — N186 End stage renal disease: Secondary | ICD-10-CM | POA: Diagnosis not present

## 2020-01-24 DIAGNOSIS — D631 Anemia in chronic kidney disease: Secondary | ICD-10-CM | POA: Diagnosis not present

## 2020-01-24 DIAGNOSIS — N2581 Secondary hyperparathyroidism of renal origin: Secondary | ICD-10-CM | POA: Diagnosis not present

## 2020-01-25 ENCOUNTER — Telehealth: Payer: Self-pay | Admitting: Podiatry

## 2020-01-25 NOTE — Telephone Encounter (Signed)
Called patient to reschedule due to possible inclement weather, patient stated No Thank, I asked again to be sure she did not want to be scheduled, Patient stated No then hung up

## 2020-01-26 DIAGNOSIS — D631 Anemia in chronic kidney disease: Secondary | ICD-10-CM | POA: Diagnosis not present

## 2020-01-26 DIAGNOSIS — Z992 Dependence on renal dialysis: Secondary | ICD-10-CM | POA: Diagnosis not present

## 2020-01-26 DIAGNOSIS — N186 End stage renal disease: Secondary | ICD-10-CM | POA: Diagnosis not present

## 2020-01-26 DIAGNOSIS — D509 Iron deficiency anemia, unspecified: Secondary | ICD-10-CM | POA: Diagnosis not present

## 2020-01-26 DIAGNOSIS — N2581 Secondary hyperparathyroidism of renal origin: Secondary | ICD-10-CM | POA: Diagnosis not present

## 2020-01-28 ENCOUNTER — Ambulatory Visit: Payer: Medicare Other | Admitting: Podiatry

## 2020-01-29 DIAGNOSIS — D509 Iron deficiency anemia, unspecified: Secondary | ICD-10-CM | POA: Diagnosis not present

## 2020-01-29 DIAGNOSIS — N2581 Secondary hyperparathyroidism of renal origin: Secondary | ICD-10-CM | POA: Diagnosis not present

## 2020-01-29 DIAGNOSIS — D631 Anemia in chronic kidney disease: Secondary | ICD-10-CM | POA: Diagnosis not present

## 2020-01-29 DIAGNOSIS — Z992 Dependence on renal dialysis: Secondary | ICD-10-CM | POA: Diagnosis not present

## 2020-01-29 DIAGNOSIS — N186 End stage renal disease: Secondary | ICD-10-CM | POA: Diagnosis not present

## 2020-01-31 DIAGNOSIS — Z992 Dependence on renal dialysis: Secondary | ICD-10-CM | POA: Diagnosis not present

## 2020-01-31 DIAGNOSIS — N186 End stage renal disease: Secondary | ICD-10-CM | POA: Diagnosis not present

## 2020-01-31 DIAGNOSIS — D509 Iron deficiency anemia, unspecified: Secondary | ICD-10-CM | POA: Diagnosis not present

## 2020-01-31 DIAGNOSIS — N2581 Secondary hyperparathyroidism of renal origin: Secondary | ICD-10-CM | POA: Diagnosis not present

## 2020-01-31 DIAGNOSIS — D631 Anemia in chronic kidney disease: Secondary | ICD-10-CM | POA: Diagnosis not present

## 2020-02-05 DIAGNOSIS — D509 Iron deficiency anemia, unspecified: Secondary | ICD-10-CM | POA: Diagnosis not present

## 2020-02-05 DIAGNOSIS — Z992 Dependence on renal dialysis: Secondary | ICD-10-CM | POA: Diagnosis not present

## 2020-02-05 DIAGNOSIS — D631 Anemia in chronic kidney disease: Secondary | ICD-10-CM | POA: Diagnosis not present

## 2020-02-05 DIAGNOSIS — N186 End stage renal disease: Secondary | ICD-10-CM | POA: Diagnosis not present

## 2020-02-05 DIAGNOSIS — N2581 Secondary hyperparathyroidism of renal origin: Secondary | ICD-10-CM | POA: Diagnosis not present

## 2020-02-06 ENCOUNTER — Other Ambulatory Visit: Payer: Self-pay | Admitting: Cardiovascular Disease

## 2020-02-07 DIAGNOSIS — D631 Anemia in chronic kidney disease: Secondary | ICD-10-CM | POA: Diagnosis not present

## 2020-02-07 DIAGNOSIS — D509 Iron deficiency anemia, unspecified: Secondary | ICD-10-CM | POA: Diagnosis not present

## 2020-02-07 DIAGNOSIS — N2581 Secondary hyperparathyroidism of renal origin: Secondary | ICD-10-CM | POA: Diagnosis not present

## 2020-02-07 DIAGNOSIS — N186 End stage renal disease: Secondary | ICD-10-CM | POA: Diagnosis not present

## 2020-02-07 DIAGNOSIS — Z992 Dependence on renal dialysis: Secondary | ICD-10-CM | POA: Diagnosis not present

## 2020-02-09 DIAGNOSIS — N186 End stage renal disease: Secondary | ICD-10-CM | POA: Diagnosis not present

## 2020-02-09 DIAGNOSIS — Z992 Dependence on renal dialysis: Secondary | ICD-10-CM | POA: Diagnosis not present

## 2020-02-09 DIAGNOSIS — D509 Iron deficiency anemia, unspecified: Secondary | ICD-10-CM | POA: Diagnosis not present

## 2020-02-09 DIAGNOSIS — D631 Anemia in chronic kidney disease: Secondary | ICD-10-CM | POA: Diagnosis not present

## 2020-02-09 DIAGNOSIS — N2581 Secondary hyperparathyroidism of renal origin: Secondary | ICD-10-CM | POA: Diagnosis not present

## 2020-02-12 DIAGNOSIS — D631 Anemia in chronic kidney disease: Secondary | ICD-10-CM | POA: Diagnosis not present

## 2020-02-12 DIAGNOSIS — D509 Iron deficiency anemia, unspecified: Secondary | ICD-10-CM | POA: Diagnosis not present

## 2020-02-12 DIAGNOSIS — Z992 Dependence on renal dialysis: Secondary | ICD-10-CM | POA: Diagnosis not present

## 2020-02-12 DIAGNOSIS — E1122 Type 2 diabetes mellitus with diabetic chronic kidney disease: Secondary | ICD-10-CM | POA: Diagnosis not present

## 2020-02-12 DIAGNOSIS — N186 End stage renal disease: Secondary | ICD-10-CM | POA: Diagnosis not present

## 2020-02-12 DIAGNOSIS — N2581 Secondary hyperparathyroidism of renal origin: Secondary | ICD-10-CM | POA: Diagnosis not present

## 2020-02-14 DIAGNOSIS — D509 Iron deficiency anemia, unspecified: Secondary | ICD-10-CM | POA: Diagnosis not present

## 2020-02-14 DIAGNOSIS — Z992 Dependence on renal dialysis: Secondary | ICD-10-CM | POA: Diagnosis not present

## 2020-02-14 DIAGNOSIS — N2581 Secondary hyperparathyroidism of renal origin: Secondary | ICD-10-CM | POA: Diagnosis not present

## 2020-02-14 DIAGNOSIS — D631 Anemia in chronic kidney disease: Secondary | ICD-10-CM | POA: Diagnosis not present

## 2020-02-14 DIAGNOSIS — N186 End stage renal disease: Secondary | ICD-10-CM | POA: Diagnosis not present

## 2020-02-16 DIAGNOSIS — D509 Iron deficiency anemia, unspecified: Secondary | ICD-10-CM | POA: Diagnosis not present

## 2020-02-16 DIAGNOSIS — N186 End stage renal disease: Secondary | ICD-10-CM | POA: Diagnosis not present

## 2020-02-16 DIAGNOSIS — N2581 Secondary hyperparathyroidism of renal origin: Secondary | ICD-10-CM | POA: Diagnosis not present

## 2020-02-16 DIAGNOSIS — Z992 Dependence on renal dialysis: Secondary | ICD-10-CM | POA: Diagnosis not present

## 2020-02-16 DIAGNOSIS — D631 Anemia in chronic kidney disease: Secondary | ICD-10-CM | POA: Diagnosis not present

## 2020-02-19 DIAGNOSIS — N2581 Secondary hyperparathyroidism of renal origin: Secondary | ICD-10-CM | POA: Diagnosis not present

## 2020-02-19 DIAGNOSIS — N186 End stage renal disease: Secondary | ICD-10-CM | POA: Diagnosis not present

## 2020-02-19 DIAGNOSIS — D631 Anemia in chronic kidney disease: Secondary | ICD-10-CM | POA: Diagnosis not present

## 2020-02-19 DIAGNOSIS — Z992 Dependence on renal dialysis: Secondary | ICD-10-CM | POA: Diagnosis not present

## 2020-02-19 DIAGNOSIS — D509 Iron deficiency anemia, unspecified: Secondary | ICD-10-CM | POA: Diagnosis not present

## 2020-02-21 DIAGNOSIS — D509 Iron deficiency anemia, unspecified: Secondary | ICD-10-CM | POA: Diagnosis not present

## 2020-02-21 DIAGNOSIS — D631 Anemia in chronic kidney disease: Secondary | ICD-10-CM | POA: Diagnosis not present

## 2020-02-21 DIAGNOSIS — N2581 Secondary hyperparathyroidism of renal origin: Secondary | ICD-10-CM | POA: Diagnosis not present

## 2020-02-21 DIAGNOSIS — Z992 Dependence on renal dialysis: Secondary | ICD-10-CM | POA: Diagnosis not present

## 2020-02-21 DIAGNOSIS — N186 End stage renal disease: Secondary | ICD-10-CM | POA: Diagnosis not present

## 2020-02-23 DIAGNOSIS — Z992 Dependence on renal dialysis: Secondary | ICD-10-CM | POA: Diagnosis not present

## 2020-02-23 DIAGNOSIS — N186 End stage renal disease: Secondary | ICD-10-CM | POA: Diagnosis not present

## 2020-02-23 DIAGNOSIS — D631 Anemia in chronic kidney disease: Secondary | ICD-10-CM | POA: Diagnosis not present

## 2020-02-23 DIAGNOSIS — D509 Iron deficiency anemia, unspecified: Secondary | ICD-10-CM | POA: Diagnosis not present

## 2020-02-23 DIAGNOSIS — N2581 Secondary hyperparathyroidism of renal origin: Secondary | ICD-10-CM | POA: Diagnosis not present

## 2020-02-25 ENCOUNTER — Other Ambulatory Visit: Payer: Self-pay

## 2020-02-25 ENCOUNTER — Encounter: Payer: Self-pay | Admitting: Podiatry

## 2020-02-25 ENCOUNTER — Ambulatory Visit (INDEPENDENT_AMBULATORY_CARE_PROVIDER_SITE_OTHER): Payer: Medicare Other | Admitting: Podiatry

## 2020-02-25 DIAGNOSIS — B351 Tinea unguium: Secondary | ICD-10-CM | POA: Diagnosis not present

## 2020-02-25 DIAGNOSIS — IMO0002 Reserved for concepts with insufficient information to code with codable children: Secondary | ICD-10-CM

## 2020-02-25 DIAGNOSIS — E1142 Type 2 diabetes mellitus with diabetic polyneuropathy: Secondary | ICD-10-CM | POA: Diagnosis not present

## 2020-02-25 DIAGNOSIS — L309 Dermatitis, unspecified: Secondary | ICD-10-CM

## 2020-02-25 DIAGNOSIS — E1165 Type 2 diabetes mellitus with hyperglycemia: Secondary | ICD-10-CM | POA: Diagnosis not present

## 2020-02-25 MED ORDER — LIDOCAINE-PRILOCAINE 2.5-2.5 % EX CREA: 1.0000 "application " | TOPICAL_CREAM | CUTANEOUS | 3 refills | Status: DC | PRN

## 2020-02-25 NOTE — Patient Instructions (Signed)
A dermatology referral will be sent to:  [facility] [address] [place],  [place]

## 2020-02-25 NOTE — Progress Notes (Signed)
  Subjective:  Patient ID: Susan Horton, female    DOB: Oct 25, 1970,  MRN: [id number]  Chief Complaint  Patient presents with  . routine foot care    Pt stated that she has neuropathy and her feet burn all the time. She stated that she has these painful spots on her foot toward the ankle.    50 y.o. female presents with the above complaint. History confirmed with patient.   Objective:  Physical Exam: warm, good capillary refill, no trophic changes or ulcerative lesions and normal DP and PT pulses.  She is an abnormal sensory exam with loss of protective sensation as well as subjective paresthesias.  She has onychomycosis with thickening brown discoloration with subungual debris of the nail plates x10.  Small pinpoint lesions which appears to be ruptured vesicles from the anterior ankle bilaterally Assessment:   1. Uncontrolled type 2 diabetes mellitus with peripheral neuropathy ([place])   2. Onychomycosis   3. Dermatitis      Plan:  Patient was evaluated and treated and all questions answered.   Patient educated on diabetes. Discussed proper diabetic foot care and discussed risks and complications of disease. Educated patient in depth on reasons to return to the office immediately should he/she discover anything concerning or new on the feet. All questions answered. Discussed proper shoes as well.   Discussed etiology treatment options of diabetic peripheral neuropathy.  Her A1c is very good now.  Unfortunate she does have persistent neuropathy.  She has tried several medications with no relief.  I discussed with her that this may not be curable.  I did offer her topical lidocaine ointment to see if this helps.  If she requires further treatment for this she should discuss with her PCP.  She does have a small vesicular option of what appears to be a dermatitis.  I am referring her to dermatology for evaluation  Return if symptoms worsen or fail to improve.

## 2020-02-26 DIAGNOSIS — Z992 Dependence on renal dialysis: Secondary | ICD-10-CM | POA: Diagnosis not present

## 2020-02-26 DIAGNOSIS — D509 Iron deficiency anemia, unspecified: Secondary | ICD-10-CM | POA: Diagnosis not present

## 2020-02-26 DIAGNOSIS — N186 End stage renal disease: Secondary | ICD-10-CM | POA: Diagnosis not present

## 2020-02-26 DIAGNOSIS — N2581 Secondary hyperparathyroidism of renal origin: Secondary | ICD-10-CM | POA: Diagnosis not present

## 2020-02-26 DIAGNOSIS — D631 Anemia in chronic kidney disease: Secondary | ICD-10-CM | POA: Diagnosis not present

## 2020-02-28 DIAGNOSIS — D631 Anemia in chronic kidney disease: Secondary | ICD-10-CM | POA: Diagnosis not present

## 2020-02-28 DIAGNOSIS — D509 Iron deficiency anemia, unspecified: Secondary | ICD-10-CM | POA: Diagnosis not present

## 2020-02-28 DIAGNOSIS — N2581 Secondary hyperparathyroidism of renal origin: Secondary | ICD-10-CM | POA: Diagnosis not present

## 2020-02-28 DIAGNOSIS — Z992 Dependence on renal dialysis: Secondary | ICD-10-CM | POA: Diagnosis not present

## 2020-02-28 DIAGNOSIS — N186 End stage renal disease: Secondary | ICD-10-CM | POA: Diagnosis not present

## 2020-03-01 DIAGNOSIS — D509 Iron deficiency anemia, unspecified: Secondary | ICD-10-CM | POA: Diagnosis not present

## 2020-03-01 DIAGNOSIS — N186 End stage renal disease: Secondary | ICD-10-CM | POA: Diagnosis not present

## 2020-03-01 DIAGNOSIS — N2581 Secondary hyperparathyroidism of renal origin: Secondary | ICD-10-CM | POA: Diagnosis not present

## 2020-03-01 DIAGNOSIS — D631 Anemia in chronic kidney disease: Secondary | ICD-10-CM | POA: Diagnosis not present

## 2020-03-01 DIAGNOSIS — Z992 Dependence on renal dialysis: Secondary | ICD-10-CM | POA: Diagnosis not present

## 2020-03-04 DIAGNOSIS — Z992 Dependence on renal dialysis: Secondary | ICD-10-CM | POA: Diagnosis not present

## 2020-03-04 DIAGNOSIS — D631 Anemia in chronic kidney disease: Secondary | ICD-10-CM | POA: Diagnosis not present

## 2020-03-04 DIAGNOSIS — N186 End stage renal disease: Secondary | ICD-10-CM | POA: Diagnosis not present

## 2020-03-04 DIAGNOSIS — D509 Iron deficiency anemia, unspecified: Secondary | ICD-10-CM | POA: Diagnosis not present

## 2020-03-04 DIAGNOSIS — N2581 Secondary hyperparathyroidism of renal origin: Secondary | ICD-10-CM | POA: Diagnosis not present

## 2020-03-06 DIAGNOSIS — N2581 Secondary hyperparathyroidism of renal origin: Secondary | ICD-10-CM | POA: Diagnosis not present

## 2020-03-06 DIAGNOSIS — D631 Anemia in chronic kidney disease: Secondary | ICD-10-CM | POA: Diagnosis not present

## 2020-03-06 DIAGNOSIS — Z992 Dependence on renal dialysis: Secondary | ICD-10-CM | POA: Diagnosis not present

## 2020-03-06 DIAGNOSIS — D509 Iron deficiency anemia, unspecified: Secondary | ICD-10-CM | POA: Diagnosis not present

## 2020-03-06 DIAGNOSIS — N186 End stage renal disease: Secondary | ICD-10-CM | POA: Diagnosis not present

## 2020-03-08 DIAGNOSIS — D509 Iron deficiency anemia, unspecified: Secondary | ICD-10-CM | POA: Diagnosis not present

## 2020-03-08 DIAGNOSIS — Z992 Dependence on renal dialysis: Secondary | ICD-10-CM | POA: Diagnosis not present

## 2020-03-08 DIAGNOSIS — D631 Anemia in chronic kidney disease: Secondary | ICD-10-CM | POA: Diagnosis not present

## 2020-03-08 DIAGNOSIS — N2581 Secondary hyperparathyroidism of renal origin: Secondary | ICD-10-CM | POA: Diagnosis not present

## 2020-03-08 DIAGNOSIS — N186 End stage renal disease: Secondary | ICD-10-CM | POA: Diagnosis not present

## 2020-03-11 DIAGNOSIS — D509 Iron deficiency anemia, unspecified: Secondary | ICD-10-CM | POA: Diagnosis not present

## 2020-03-11 DIAGNOSIS — D631 Anemia in chronic kidney disease: Secondary | ICD-10-CM | POA: Diagnosis not present

## 2020-03-11 DIAGNOSIS — N186 End stage renal disease: Secondary | ICD-10-CM | POA: Diagnosis not present

## 2020-03-11 DIAGNOSIS — N2581 Secondary hyperparathyroidism of renal origin: Secondary | ICD-10-CM | POA: Diagnosis not present

## 2020-03-11 DIAGNOSIS — E1122 Type 2 diabetes mellitus with diabetic chronic kidney disease: Secondary | ICD-10-CM | POA: Diagnosis not present

## 2020-03-11 DIAGNOSIS — Z992 Dependence on renal dialysis: Secondary | ICD-10-CM | POA: Diagnosis not present

## 2020-03-13 DIAGNOSIS — D631 Anemia in chronic kidney disease: Secondary | ICD-10-CM | POA: Diagnosis not present

## 2020-03-13 DIAGNOSIS — N186 End stage renal disease: Secondary | ICD-10-CM | POA: Diagnosis not present

## 2020-03-13 DIAGNOSIS — Z992 Dependence on renal dialysis: Secondary | ICD-10-CM | POA: Diagnosis not present

## 2020-03-13 DIAGNOSIS — D509 Iron deficiency anemia, unspecified: Secondary | ICD-10-CM | POA: Diagnosis not present

## 2020-03-13 DIAGNOSIS — N2581 Secondary hyperparathyroidism of renal origin: Secondary | ICD-10-CM | POA: Diagnosis not present

## 2020-03-15 DIAGNOSIS — D631 Anemia in chronic kidney disease: Secondary | ICD-10-CM | POA: Diagnosis not present

## 2020-03-15 DIAGNOSIS — N2581 Secondary hyperparathyroidism of renal origin: Secondary | ICD-10-CM | POA: Diagnosis not present

## 2020-03-15 DIAGNOSIS — N186 End stage renal disease: Secondary | ICD-10-CM | POA: Diagnosis not present

## 2020-03-15 DIAGNOSIS — D509 Iron deficiency anemia, unspecified: Secondary | ICD-10-CM | POA: Diagnosis not present

## 2020-03-15 DIAGNOSIS — Z992 Dependence on renal dialysis: Secondary | ICD-10-CM | POA: Diagnosis not present

## 2020-03-17 DIAGNOSIS — I871 Compression of vein: Secondary | ICD-10-CM | POA: Diagnosis not present

## 2020-03-17 DIAGNOSIS — N186 End stage renal disease: Secondary | ICD-10-CM | POA: Diagnosis not present

## 2020-03-17 DIAGNOSIS — Z992 Dependence on renal dialysis: Secondary | ICD-10-CM | POA: Diagnosis not present

## 2020-03-17 DIAGNOSIS — T82858A Stenosis of vascular prosthetic devices, implants and grafts, initial encounter: Secondary | ICD-10-CM | POA: Diagnosis not present

## 2020-03-18 DIAGNOSIS — N186 End stage renal disease: Secondary | ICD-10-CM | POA: Diagnosis not present

## 2020-03-18 DIAGNOSIS — Z992 Dependence on renal dialysis: Secondary | ICD-10-CM | POA: Diagnosis not present

## 2020-03-18 DIAGNOSIS — N2581 Secondary hyperparathyroidism of renal origin: Secondary | ICD-10-CM | POA: Diagnosis not present

## 2020-03-18 DIAGNOSIS — D631 Anemia in chronic kidney disease: Secondary | ICD-10-CM | POA: Diagnosis not present

## 2020-03-18 DIAGNOSIS — D509 Iron deficiency anemia, unspecified: Secondary | ICD-10-CM | POA: Diagnosis not present

## 2020-03-20 DIAGNOSIS — N2581 Secondary hyperparathyroidism of renal origin: Secondary | ICD-10-CM | POA: Diagnosis not present

## 2020-03-20 DIAGNOSIS — N186 End stage renal disease: Secondary | ICD-10-CM | POA: Diagnosis not present

## 2020-03-20 DIAGNOSIS — D631 Anemia in chronic kidney disease: Secondary | ICD-10-CM | POA: Diagnosis not present

## 2020-03-20 DIAGNOSIS — Z992 Dependence on renal dialysis: Secondary | ICD-10-CM | POA: Diagnosis not present

## 2020-03-20 DIAGNOSIS — D509 Iron deficiency anemia, unspecified: Secondary | ICD-10-CM | POA: Diagnosis not present

## 2020-03-22 ENCOUNTER — Encounter (HOSPITAL_COMMUNITY): Payer: Self-pay | Admitting: Emergency Medicine

## 2020-03-22 ENCOUNTER — Other Ambulatory Visit: Payer: Self-pay

## 2020-03-22 ENCOUNTER — Emergency Department (HOSPITAL_COMMUNITY)
Admission: EM | Admit: 2020-03-22 | Discharge: 2020-03-22 | Disposition: A | Discharge status: Home / Self Care | Payer: Medicare Other | Attending: Emergency Medicine | Admitting: Emergency Medicine

## 2020-03-22 ENCOUNTER — Emergency Department (HOSPITAL_COMMUNITY): Payer: Medicare Other

## 2020-03-22 DIAGNOSIS — Z87891 Personal history of nicotine dependence: Secondary | ICD-10-CM | POA: Insufficient documentation

## 2020-03-22 DIAGNOSIS — E1122 Type 2 diabetes mellitus with diabetic chronic kidney disease: Secondary | ICD-10-CM | POA: Insufficient documentation

## 2020-03-22 DIAGNOSIS — K3184 Gastroparesis: Secondary | ICD-10-CM | POA: Diagnosis not present

## 2020-03-22 DIAGNOSIS — I2510 Atherosclerotic heart disease of native coronary artery without angina pectoris: Secondary | ICD-10-CM | POA: Diagnosis not present

## 2020-03-22 DIAGNOSIS — Z992 Dependence on renal dialysis: Secondary | ICD-10-CM | POA: Insufficient documentation

## 2020-03-22 DIAGNOSIS — Z7982 Long term (current) use of aspirin: Secondary | ICD-10-CM | POA: Diagnosis not present

## 2020-03-22 DIAGNOSIS — I5032 Chronic diastolic (congestive) heart failure: Secondary | ICD-10-CM | POA: Diagnosis not present

## 2020-03-22 DIAGNOSIS — R109 Unspecified abdominal pain: Secondary | ICD-10-CM | POA: Diagnosis not present

## 2020-03-22 DIAGNOSIS — E1143 Type 2 diabetes mellitus with diabetic autonomic (poly)neuropathy: Secondary | ICD-10-CM

## 2020-03-22 DIAGNOSIS — Z79899 Other long term (current) drug therapy: Secondary | ICD-10-CM | POA: Diagnosis not present

## 2020-03-22 DIAGNOSIS — I132 Hypertensive heart and chronic kidney disease with heart failure and with stage 5 chronic kidney disease, or end stage renal disease: Secondary | ICD-10-CM | POA: Insufficient documentation

## 2020-03-22 DIAGNOSIS — Z955 Presence of coronary angioplasty implant and graft: Secondary | ICD-10-CM | POA: Insufficient documentation

## 2020-03-22 DIAGNOSIS — E1140 Type 2 diabetes mellitus with diabetic neuropathy, unspecified: Secondary | ICD-10-CM | POA: Diagnosis not present

## 2020-03-22 DIAGNOSIS — K219 Gastro-esophageal reflux disease without esophagitis: Secondary | ICD-10-CM | POA: Insufficient documentation

## 2020-03-22 DIAGNOSIS — Z8616 Personal history of COVID-19: Secondary | ICD-10-CM | POA: Diagnosis not present

## 2020-03-22 DIAGNOSIS — R112 Nausea with vomiting, unspecified: Secondary | ICD-10-CM

## 2020-03-22 DIAGNOSIS — Z794 Long term (current) use of insulin: Secondary | ICD-10-CM | POA: Insufficient documentation

## 2020-03-22 DIAGNOSIS — N186 End stage renal disease: Secondary | ICD-10-CM | POA: Diagnosis not present

## 2020-03-22 DIAGNOSIS — Z7902 Long term (current) use of antithrombotics/antiplatelets: Secondary | ICD-10-CM | POA: Diagnosis not present

## 2020-03-22 DIAGNOSIS — R1110 Vomiting, unspecified: Secondary | ICD-10-CM | POA: Diagnosis not present

## 2020-03-22 DIAGNOSIS — I10 Essential (primary) hypertension: Secondary | ICD-10-CM | POA: Diagnosis not present

## 2020-03-22 LAB — CBC
HCT: 39.7 % (ref 36.0–46.0)
Hemoglobin: 12.3 g/dL (ref 12.0–15.0)
MCH: 26.1 pg (ref 26.0–34.0)
MCHC: 31 g/dL (ref 30.0–36.0)
MCV: 84.3 fL (ref 80.0–100.0)
Platelets: 327 10*3/uL (ref 150–400)
RBC: 4.71 MIL/uL (ref 3.87–5.11)
RDW: 17.5 % — ABNORMAL HIGH (ref 11.5–15.5)
WBC: 8.1 10*3/uL (ref 4.0–10.5)
nRBC: 0 % (ref 0.0–0.2)

## 2020-03-22 LAB — COMPREHENSIVE METABOLIC PANEL
ALT: 12 U/L (ref 0–44)
AST: 16 U/L (ref 15–41)
Albumin: 3.8 g/dL (ref 3.5–5.0)
Alkaline Phosphatase: 79 U/L (ref 38–126)
Anion gap: 16 — ABNORMAL HIGH (ref 5–15)
BUN: 24 mg/dL — ABNORMAL HIGH (ref 6–20)
CO2: 27 mmol/L (ref 22–32)
Calcium: 10.2 mg/dL (ref 8.9–10.3)
Chloride: 94 mmol/L — ABNORMAL LOW (ref 98–111)
Creatinine, Ser: 8.18 mg/dL — ABNORMAL HIGH (ref 0.44–1.00)
GFR, Estimated: 6 mL/min — ABNORMAL LOW (ref >60)
Glucose, Bld: 105 mg/dL — ABNORMAL HIGH (ref 70–99)
Potassium: 4 mmol/L (ref 3.5–5.1)
Sodium: 137 mmol/L (ref 135–145)
Total Bilirubin: 1.2 mg/dL (ref 0.3–1.2)
Total Protein: 8.1 g/dL (ref 6.5–8.1)

## 2020-03-22 LAB — LIPASE, BLOOD: Lipase: 36 U/L (ref 11–51)

## 2020-03-22 LAB — I-STAT BETA HCG BLOOD, ED (MC, WL, AP ONLY): I-stat hCG, quantitative: <5 m[IU]/mL (ref <5)

## 2020-03-22 MED ORDER — METOCLOPRAMIDE HCL 5 MG/ML IJ SOLN
10.0000 mg | Freq: Once | INTRAMUSCULAR | Status: AC
  Administered 2020-03-22: 10 mg via INTRAVENOUS
  Filled 2020-03-22: qty 2

## 2020-03-22 MED ORDER — PANTOPRAZOLE SODIUM 40 MG IV SOLR
40.0000 mg | Freq: Once | INTRAVENOUS | Status: AC
  Administered 2020-03-22: 40 mg via INTRAVENOUS
  Filled 2020-03-22: qty 40

## 2020-03-22 MED ORDER — SODIUM CHLORIDE 0.9 % IV BOLUS
500.0000 mL | Freq: Once | INTRAVENOUS | Status: AC
  Administered 2020-03-22: 500 mL via INTRAVENOUS

## 2020-03-22 MED ORDER — ONDANSETRON HCL 4 MG/2ML IJ SOLN
4.0000 mg | Freq: Once | INTRAMUSCULAR | Status: AC
  Administered 2020-03-22: 4 mg via INTRAVENOUS
  Filled 2020-03-22: qty 2

## 2020-03-22 MED ORDER — PANTOPRAZOLE SODIUM 40 MG PO TBEC: 40.0000 mg | DELAYED_RELEASE_TABLET | Freq: Every day | ORAL | 0 refills | Status: DC

## 2020-03-22 MED ORDER — SUCRALFATE 1 GM/10ML PO SUSP: 1.0000 g | Freq: Three times a day (TID) | ORAL | 0 refills | Status: DC

## 2020-03-22 MED ORDER — MORPHINE SULFATE (PF) 4 MG/ML IV SOLN
4.0000 mg | Freq: Once | INTRAVENOUS | Status: AC
  Administered 2020-03-22: 4 mg via INTRAVENOUS
  Filled 2020-03-22: qty 1

## 2020-03-22 MED ORDER — METOCLOPRAMIDE HCL 5 MG PO TABS: 5.0000 mg | ORAL_TABLET | Freq: Three times a day (TID) | ORAL | 0 refills | Status: DC | PRN

## 2020-03-22 NOTE — Discharge Instructions (Signed)
I am glad your symptoms have improved, presentation seems consistent with your gastroparesis.  Use Reglan as needed for nausea and vomiting, start with clear liquids for the next 12 hours and then advance your diet as tolerated. Take Protonix to help with acid production and use Carafate to help coat and protect her stomach lining.  Please call to schedule follow-up with your regular doctor.  Please call your dialysis center since you missed dialysis today.  Return for new or worsening symptoms.

## 2020-03-22 NOTE — ED Notes (Signed)
Pt aao4, gcs15, nadn after fluid challenge, pt vss at discharge, ambulates steady gait to wheelchair, pt waiting for sister in lobby. Denies any complaints, pt to follow up with primary provider for follow up treatment.

## 2020-03-22 NOTE — ED Provider Notes (Signed)
[facility] EMERGENCY DEPARTMENT Provider Note   CSN: [id number] Arrival date & time: 03/22/20  1112     History Chief Complaint  Patient presents with  . Abdominal Pain  . Vomiting    Susan Horton is a 50 y.o. female.  Susan Horton is a 50 y.o. female with a history of gastroparesis, type 2 diabetes, GERD, hypertension, CKD, DVT, CAD, who presents to the emergency department for evaluation of vomiting. Patient reports that for the past 2 days she has been having epigastric and periumbilical abdominal pain.  Symptoms are associated with numerous episodes of vomiting. Patient reports that she has vomited numerous times.  She has not seen any blood in her vomit.  She reports vomiting makes her pain worse.  She has not been able to keep anything down including her home acid reducing and nausea medications.  Reports normal bowel movements, no melena or hematochezia.  Has not had any fevers or chills.  Reports that because of all this vomiting she feels a bit fatigued and weak.  Has been a bit lightheaded but has not had any syncopal episodes.  Denies any associated chest pain or shortness of breath.  Patient reports that the symptoms feel similar to her previous episodes of gastroparesis which she has a long history of.  Patient denies using marijuana, alcohol or other drugs.  She does report that she is around people who use marijuana routinely.  Patient reports that she missed dialysis due to to the symptoms today, was last dialyzed on Thursday.        Past Medical History:  Diagnosis Date  . Abnormal stress test   . Anemia   . Angio-edema   . Asthma   . CAD (coronary artery disease)    DES to mid LAD July 2018, residual moderate RCA disease  . Cataract   . CKD (chronic kidney disease) stage 4, GFR 15-29 ml/min (HCC)    Dialysis T/Th/Sa  . DVT (deep venous thrombosis) ([place])    1996 during pregnancy, 2015 left leg  . Eczema   . Essential hypertension 12/11/2015   . Gastroparesis   . GERD (gastroesophageal reflux disease)   . Gluteal abscess 12/27/2018  . Hidradenitis   . Migraine   . Neuropathy   . Peripheral vascular disease ([place])    blood clot in leg  . Progressive angina ([place]) 06/05/2014   Chest pain  . Type 2 diabetes mellitus ([place])   . Type II diabetes mellitus ([place])   . Urticaria     Patient Active Problem List   Diagnosis Date Noted  . Nausea 09/12/2019  . Diarrhea, unspecified 09/12/2019  . Pruritus, unspecified 08/13/2019  . Anaphylactic shock, unspecified, sequela 08/13/2019  . Allergy, unspecified, sequela 08/13/2019  . Unspecified cataract 03/19/2019  . Unspecified asthma with (acute) exacerbation 03/19/2019  . COVID-19 03/06/2019  . Intractable nausea and vomiting 02/24/2019  . Epigastric abdominal pain 02/24/2019  . Multifocal pneumonia 02/24/2019  . Shortness of breath 01/19/2019  . Cerebrovascular small vessel disease   . Generalized weakness   . Abscess of sacrum ([place])   . Gluteal abscess 12/25/2018  . Moderate protein-calorie malnutrition ([place]) 12/19/2018  . Other disorders resulting from impaired renal tubular function 12/19/2018  . Liver disease, unspecified 12/19/2018  . Hyperkalemia 12/19/2018  . Acidosis 12/19/2018  . Hyperlipidemia 11/10/2018  . History of arteriosclerotic vascular disease 11/10/2018  . Carotid stenosis 11/10/2018  . Symptomatic anemia 09/16/2018  . Peripheral vascular disease, unspecified ([place]) 09/04/2018  .  Other disorders of bilirubin metabolism 01/20/2018  . Other long term (current) drug therapy 11/28/2017  . Other abnormal findings in urine 11/28/2017  . Dependence on renal dialysis ([place]) 11/28/2017  . ESRD on peritoneal dialysis ([place]) 10/11/2017  . Complication of vascular dialysis catheter 09/15/2017  . History of anemia due to CKD 07/18/2017  . DVT (deep vein thrombosis) in pregnancy 07/18/2017  . Encounter for removal of sutures 07/07/2017  . Iron deficiency anemia,  unspecified 06/30/2017  . Secondary hyperparathyroidism of renal origin ([place]) 06/24/2017  . Pain, unspecified 06/24/2017  . Fever, unspecified 06/24/2017  . End stage renal disease ([place]) 06/24/2017  . Coagulation defect, unspecified ([place]) 06/24/2017  . Anemia in chronic kidney disease 06/24/2017  . Chronic diastolic CHF (congestive heart failure) ([place]) 06/18/2017  . Moderate persistent asthma 06/10/2017  . CKD (chronic kidney disease) stage 4, GFR 15-29 ml/min (HCC)   . CAD S/P mLAD PCI with DES 08/06/2016  . Presence of drug coated stent in LAD coronary artery 08/06/2016  . Atherosclerotic heart disease of native coronary artery without angina pectoris 07/11/2016  . Carotid bruit present 06/15/2016  . Dyslipidemia 06/15/2016  . Smoker 06/15/2016  . Neuropathy 03/10/2016  . Essential hypertension, benign 12/11/2015  . Left facial pain 03/04/2015  . Abnormal sense of taste 03/04/2015  . Gastroparesis   . Uncontrolled type 2 diabetes mellitus with peripheral neuropathy ([place]) 06/05/2014  . Yeast infection of the vagina 07/10/2010    Past Surgical History:  Procedure Laterality Date  . A/V FISTULAGRAM N/A 06/22/2017   Procedure: A/V FISTULAGRAM - left arm;  Surgeon: [name], MD;  Location: [facility];  Service: Cardiovascular;  Laterality: N/A;  . ABDOMINAL AORTOGRAM W/LOWER EXTREMITY Bilateral 12/14/2018   Procedure: ABDOMINAL AORTOGRAM W/LOWER EXTREMITY;  Surgeon: [name], MD;  Location: [facility];  Service: Cardiovascular;  Laterality: Bilateral;  . ABDOMINAL AORTOGRAM W/LOWER EXTREMITY N/A 04/25/2019   Procedure: ABDOMINAL AORTOGRAM W/LOWER EXTREMITY;  Surgeon: [name], MD;  Location: [facility];  Service: Cardiovascular;  Laterality: N/A;  . ABDOMINAL AORTOGRAM W/LOWER EXTREMITY Left 07/18/2019   Procedure: ABDOMINAL AORTOGRAM W/LOWER EXTREMITY;  Surgeon: [name], MD;  Location: [facility];   Service: Cardiovascular;  Laterality: Left;  . ABDOMINOPLASTY    . ADENOIDECTOMY    . APPENDECTOMY  1995  . AV FISTULA PLACEMENT Left 02/01/2017   Procedure: ARTERIOVENOUS BRACHIOCEPHALIC (AV) FISTULA CREATION;  Surgeon: [name], MD;  Location: [facility];  Service: Vascular;  Laterality: Left;  . CHOLECYSTECTOMY  1993  . CORONARY ANGIOPLASTY WITH STENT PLACEMENT    . CORONARY STENT INTERVENTION N/A 08/05/2016   Procedure: Coronary Stent Intervention;  Surgeon: [name], MD;  Location: [facility];  Service: Cardiovascular;  Laterality: N/A;  . EXPLORATORY LAPAROTOMY  08/14/2005   lysis of adhesions, drainage of tubo-ovarian abscess  . FISTULA SUPERFICIALIZATION Left 09/14/2017   Procedure: FISTULA SUPERFICIALIZATION ARTERIOVENOUS FISTULA LEFT ARM;  Surgeon: [name], MD;  Location: [facility];  Service: Vascular;  Laterality: Left;  . FLEXIBLE SIGMOIDOSCOPY N/A 01/04/2019   Procedure: FLEXIBLE SIGMOIDOSCOPY;  Surgeon: Clarene Essex, MD;  Location: [facility];  Service: Endoscopy;  Laterality: N/A;  . HYDRADENITIS EXCISION  02/08/2011   Procedure: EXCISION HYDRADENITIS GROIN;  Surgeon: [name], MD;  Location: [facility];  Service: General;  Laterality: N/A;  Excisioin of Hidradenitis Left groin  . INCISION AND DRAINAGE ABSCESS N/A 01/11/2019   Procedure: INCISION  AND DRAINAGE SACRAL ABSCESS;  Surgeon: Georganna Skeans, MD;  Location: [facility];  Service: General;  Laterality: N/A;  . INGUINAL HIDRADENITIS EXCISION  07/06/2010   bilateral  . INSERTION OF DIALYSIS CATHETER N/A 09/14/2017   Procedure: INSERTION OF TUNNELED DIALYSIS CATHETER Right Internal Jugular;  Surgeon: [name], MD;  Location: [facility];  Service: Vascular;  Laterality: N/A;  . IR FLUORO GUIDE CV LINE RIGHT  03/01/2019  . IR US GUIDE BX ASP/DRAIN  01/01/2019  . IR US GUIDE VASC ACCESS RIGHT  03/01/2019  . LEFT HEART CATH AND CORONARY ANGIOGRAPHY N/A 08/05/2016   Procedure:  Left Heart Cath and Coronary Angiography;  Surgeon: [name], MD;  Location: [facility];  Service: Cardiovascular;  Laterality: N/A;  . PERIPHERAL VASCULAR INTERVENTION Left 12/14/2018   Procedure: PERIPHERAL VASCULAR INTERVENTION;  Surgeon: [name], MD;  Location: [facility];  Service: Cardiovascular;  Laterality: Left;  SFA  . PERIPHERAL VASCULAR INTERVENTION Left 04/25/2019   Procedure: PERIPHERAL VASCULAR INTERVENTION;  Surgeon: [name], MD;  Location: [facility];  Service: Cardiovascular;  Laterality: Left;  right superficial femoral, and left iliac  . PERIPHERAL VASCULAR INTERVENTION Left 07/18/2019   Procedure: PERIPHERAL VASCULAR INTERVENTION;  Surgeon: [name], MD;  Location: [facility];  Service: Cardiovascular;  Laterality: Left;  external iliac  . REDUCTION MAMMAPLASTY  2002  . TONSILLECTOMY    . TUBAL LIGATION  1996  . VAGINAL HYSTERECTOMY  08/04/2005   and cysto     OB History   No obstetric history on file.     Family History  Problem Relation Age of Onset  . Allergic rhinitis Mother   . Hypertension Father   . Allergic rhinitis Father   . Hypertension Sister   . Allergic rhinitis Sister   . Hypertension Brother   . Allergic rhinitis Brother   . Breast cancer Cousin        x2    Social History   Tobacco Use  . Smoking status: Former Smoker    Years: 20.00    Types: Cigarettes    Quit date: 11/2016    Years since quitting: 3.3  . Smokeless tobacco: Never Used  Vaping Use  . Vaping Use: Never used  Substance Use Topics  . Alcohol use: No    Alcohol/week: 0.0 standard drinks  . Drug use: No    Home Medications Prior to Admission medications   Medication Sig Start Date End Date Taking? Authorizing Provider  albuterol (PROVENTIL HFA;VENTOLIN HFA) 108 (90 BASE) MCG/ACT inhaler Inhale 1-2 puffs into the lungs every 6 (six) hours as needed for wheezing or shortness of breath.      [provider]  amitriptyline (ELAVIL) 10 MG tablet Take 1 tablet (10 mg total) by mouth at bedtime. 09/19/18   Jamse Arn, MD  amLODipine (NORVASC) 10 MG tablet Take 1 tablet (10 mg total) by mouth daily. 08/06/19   [name], MD  aspirin 81 MG chewable tablet Chew 1 tablet (81 mg total) by mouth daily. 08/07/16   Cheryln Manly, NP  atorvastatin (LIPITOR) 40 MG tablet TAKE 1 TABLET BY MOUTH EVERY DAY 01/09/20   [name], MD  B Complex-C-Zn-Folic Acid (DIALYVITE 326 WITH ZINC) 0.8 MG TABS Take 1 tablet by mouth daily. 10/06/18   [provider]  calcitRIOL (ROCALTROL) 0.5 MCG capsule Take 1.5 mcg by mouth daily. 09/11/18   [provider]  calcium  acetate (PHOSLO) 667 MG capsule Take 1,334-2,001 mg by mouth See admin instructions. Take 2001 mg by mouth up to three times daily with meals and 1334 mg with snacks 09/23/17   [provider]  clopidogrel (PLAVIX) 75 MG tablet Take 1 tablet (75 mg total) by mouth daily with breakfast. 08/06/19   [name], MD  doxercalciferol (HECTOROL) 4 MCG/2ML injection Doxercalciferol (Hectorol) 09/25/19 09/23/20  [provider]  Fluticasone-Salmeterol (ADVAIR) 250-50 MCG/DOSE AEPB Inhale 1 puff into the lungs 2 (two) times daily. 06/10/17   Charlott Rakes, MD  heparin 1000 unit/mL SOLN injection Heparin Sodium (Porcine) 1,000 Units/mL Systemic 08/14/19 08/12/20  [provider]  hydrALAZINE (APRESOLINE) 25 MG tablet Take 1 tablet (25 mg total) by mouth 3 (three) times daily. 08/06/19 11/04/19  [name], MD  insulin aspart (NOVOLOG) 100 UNIT/ML injection 0-6 Units, Subcutaneous, 3 times daily with meals CBG < 70: Implement Hypoglycemia measures/call MD CBG 70 - 120: 0 units CBG 121 - 150: 0 units CBG 151 - 200: 1 unit CBG 201-250: 2 units CBG 251-300: 3 units CBG 301-350: 4 units CBG 351-400: 5 units CBG > 400: Give 6 units and call MD 03/02/19   Jonetta Osgood, MD  insulin  glargine, 2 Unit Dial, (TOUJEO MAX SOLOSTAR) 300 UNIT/ML Solostar Pen Inject 54 Units into the skin at bedtime.     [provider]  iron sucrose (VENOFER) 20 MG/ML injection Iron Sucrose (Venofer) 07/11/19   [provider]  isosorbide mononitrate (IMDUR) 30 MG 24 hr tablet Take 0.5 tablets (15 mg total) by mouth daily. 08/06/19   [name], MD  lidocaine-prilocaine (EMLA) cream Apply 1 application topically as needed. 02/25/20   McDonald, Stephan Minister, DPM  methocarbamol (ROBAXIN) 500 MG tablet Take 1 tablet (500 mg total) by mouth 2 (two) times daily as needed for muscle spasms. 09/07/17   Jamse Arn, MD  Methoxy PEG-Epoetin Beta (MIRCERA IJ) Inject into the skin. 07/09/19   [provider]  metoCLOPramide (REGLAN) 5 MG tablet TAKE 1 TABLET BY MOUTH 4 TIMES DAILY BEFORE MEALS AND AT BEDTIME. Patient taking differently: Take 5 mg by mouth 4 (four) times daily -  before meals and at bedtime.  03/18/17   [name], MD  metoprolol succinate (TOPROL-XL) 50 MG 24 hr tablet Take 75 mg (1.5 tablet) daily 07/03/19   [name], MD  montelukast (SINGULAIR) 10 MG tablet Take 1 tablet (10 mg total) by mouth at bedtime. 04/12/18   Kennith Gain, MD  nitroGLYCERIN (NITROSTAT) 0.4 MG SL tablet Place 1 tablet (0.4 mg total) under the tongue every 5 (five) minutes as needed for chest pain. 08/06/19   [name], MD  ondansetron (ZOFRAN ODT) 4 MG disintegrating tablet Take 1 tablet (4 mg total) by mouth every 8 (eight) hours as needed for nausea or vomiting. 03/02/19   Ghimire, Henreitta Leber, MD  pantoprazole (PROTONIX) 40 MG tablet Take 1 tablet (40 mg total) by mouth daily. 08/06/19   [name], MD  PRALUENT 75 MG/ML SOAJ INJECT 75 MG INTO THE SKIN EVERY 14 (FOURTEEN) DAYS. 02/07/20   [name], MD  trimethoprim-polymyxin b (POLYTRIM) ophthalmic solution Place 1 drop into both eyes See admin instructions. Instill one drop into both eyes four  times daily for 2 days after each monthly eye injection 06/16/17   [provider]    Allergies    Penicillins, Repatha [evolocumab], Other, and Lisinopril  Review of Systems  Review of Systems  Constitutional: Negative for chills and fever.  HENT: Negative.   Respiratory: Negative for cough and shortness of breath.   Cardiovascular: Negative for chest pain.  Gastrointestinal: Positive for abdominal pain, nausea and vomiting. Negative for constipation and diarrhea.  Genitourinary: Negative for dysuria and frequency.  Musculoskeletal: Negative for arthralgias and myalgias.  Skin: Negative for color change and rash.  Neurological: Negative for dizziness, syncope and light-headedness.  All other systems reviewed and are negative.   Physical Exam Updated Vital Signs BP (!) 188/151   Pulse (!) 103   Temp 97.9 F (36.6 C)   Resp (!) 24   SpO2 100%   Physical Exam Vitals and nursing note reviewed.  Constitutional:      General: She is not in acute distress.    Appearance: She is well-developed. She is ill-appearing. She is not diaphoretic.     Comments: On arrival patient is alert, ill-appearing and actively dry heaving  HENT:     Head: Normocephalic and atraumatic.     Mouth/Throat:     Comments: Mucous membranes slightly dry Eyes:     General:        Right eye: No discharge.        Left eye: No discharge.     Pupils: Pupils are equal, round, and reactive to light.  Cardiovascular:     Rate and Rhythm: Regular rhythm. Tachycardia present.     Heart sounds: Normal heart sounds. No murmur heard. No friction rub. No gallop.      Comments: Mildly tachycardic on arrival, actively vomiting Pulmonary:     Effort: Pulmonary effort is normal. No respiratory distress.     Breath sounds: Normal breath sounds. No wheezing or rales.     Comments: Respirations equal and unlabored, patient able to speak in full sentences, lungs clear to auscultation bilaterally, breath sounds  slightly diminished in bilateral bases Abdominal:     General: Bowel sounds are normal. There is no distension.     Palpations: Abdomen is soft. There is no mass.     Tenderness: There is abdominal tenderness in the epigastric area and periumbilical area. There is no guarding.     Comments: Abdomen soft, nondistended, bowel sounds present throughout, there is tenderness noted primarily in the epigastric and periumbilical regions, some voluntary guarding, no peritoneal signs  Musculoskeletal:        General: No deformity.     Cervical back: Neck supple.  Skin:    General: Skin is warm and dry.     Capillary Refill: Capillary refill takes less than 2 seconds.  Neurological:     Mental Status: She is alert and oriented to person, place, and time.     Coordination: Coordination normal.     Comments: Speech is clear, able to follow commands Moves extremities without ataxia, coordination intact  Psychiatric:        Mood and Affect: Mood normal.        Behavior: Behavior normal.     ED Results / Procedures / Treatments   Labs (all labs ordered are listed, but only abnormal results are displayed) Labs Reviewed  COMPREHENSIVE METABOLIC PANEL - Abnormal; Notable for the following components:      Result Value   Chloride 94 (*)    Glucose, Bld 105 (*)    BUN 24 (*)    Creatinine, Ser 8.18 (*)    GFR, Estimated 6 (*)    Anion gap 16 (*)    All  other components within normal limits  CBC - Abnormal; Notable for the following components:   RDW 17.5 (*)    All other components within normal limits  LIPASE, BLOOD  URINALYSIS, ROUTINE W REFLEX MICROSCOPIC  I-STAT BETA HCG BLOOD, ED ([facility], [facility], [facility] ONLY)    EKG EKG Interpretation  Date/Time:  Saturday March 22 2020 12:32:58 EDT Ventricular Rate:  95 PR Interval:    QRS Duration: 92 QT Interval:  368 QTC Calculation: 463 R Axis:   63 Text Interpretation: Sinus rhythm Probable left atrial enlargement Baseline wander in lead(s) V4 V5  Confirmed by Veryl Speak [id number]) on 03/23/2020 11:39:38 PM   Radiology DG Chest Port 1 View  Result Date: 03/22/2020 CLINICAL DATA:  Missed dialysis, abdominal pain and vomiting for 2 days, gastroparesis EXAM: PORTABLE CHEST 1 VIEW COMPARISON:  06/08/2019 chest radiograph. FINDINGS: Stable cardiomediastinal silhouette with top-normal heart size. No pneumothorax. No pleural effusion. Lungs appear clear, with no acute consolidative airspace disease and no pulmonary edema. Cholecystectomy clips are seen in the right upper quadrant of the abdomen. IMPRESSION: No active disease. Electronically Signed   By: Ilona Sorrel M.D.   On: 03/22/2020 12:48    Procedures Procedures   Medications Ordered in ED Medications  sodium chloride 0.9 % bolus 500 mL (500 mLs Intravenous New Bag/Given 03/22/20 1233)  metoCLOPramide (REGLAN) injection 10 mg (10 mg Intravenous Given 03/22/20 1238)  pantoprazole (PROTONIX) injection 40 mg (40 mg Intravenous Given 03/22/20 1240)  morphine 4 MG/ML injection 4 mg (4 mg Intravenous Given 03/22/20 1238)    ED Course  I have reviewed the triage vital signs and the nursing notes.  Pertinent labs & imaging results that were available during my care of the patient were reviewed by me and considered in my medical decision making (see chart for details).    MDM Rules/Calculators/A&P                         50 year old female with history of gastroparesis presents with 2 days of persistent vomiting with epigastric and periumbilical abdominal pain, feels like prior episodes of gastroparesis, actively vomiting on arrival and somewhat ill-appearing.  No peritoneal signs on abdominal exam, suspect periumbilical and epigastric tenderness is in the setting of gastroparesis.  Will evaluate with abdominal labs we will hold off on imaging at this time. Will treat with IV fluids, Reglan, Protonix and morphine.  Since patient also missed dialysis we will check EKG, chest x-ray.  I have  independently ordered, reviewed and interpreted all labs and imaging: CBC: No leukocytosis, normal hemoglobin CMP: No significant electrolyte derangements, normal potassium despite missed dialysis, BUN of 24 and creatinine of 8.18, mild anion gap, likely in the setting of chronic renal failure, normal LFTs Lipase: WNL Pregnancy: Negative  EKG with sinus rhythm with some signs of left atrial enlargement and baseline wander in leads V4 and V5.  Chest x-ray with no active cardiopulmonary disease.  No significant pulmonary edema despite missed dialysis.  After Reglan fluids and pain medication patient is feeling much better, she is no longer vomiting or dry heaving, pain is improved and she is able to tolerate p.o. fluids.  We will plan to discharge patient home with Reglan, Protonix and Carafate and have her follow closely with her GI doctor.  Strict return precautions discussed.  Discharged home in good condition.  Final Clinical Impression(s) / ED Diagnoses Final diagnoses:  Non-intractable vomiting with nausea, unspecified vomiting type  Gastroparesis  Rx / DC Orders ED Discharge Orders         Ordered    metoCLOPramide (REGLAN) 5 MG tablet  Every 8 hours PRN       Note to Pharmacy: Must have office visit for refills   03/22/20 1526    pantoprazole (PROTONIX) 40 MG tablet  Daily       Note to Pharmacy: Pt needs appt for future refills. Thank you.   03/22/20 1526    sucralfate (CARAFATE) 1 GM/10ML suspension  3 times daily with meals & bedtime        03/22/20 1526           Jacqlyn Larsen, [place] 03/26/20 1323    Luna Fuse, MD 04/02/20 [phone]

## 2020-03-22 NOTE — ED Triage Notes (Signed)
Reports mid abd pain x 2 days with vomiting.  History of gastroparesis.

## 2020-03-22 NOTE — ED Notes (Signed)
Provided patient with ginger ale for PO challenge at this time. Pt in NAD. States she feels better. Pt currently not dry heaving.

## 2020-03-25 DIAGNOSIS — D509 Iron deficiency anemia, unspecified: Secondary | ICD-10-CM | POA: Diagnosis not present

## 2020-03-25 DIAGNOSIS — N186 End stage renal disease: Secondary | ICD-10-CM | POA: Diagnosis not present

## 2020-03-25 DIAGNOSIS — D631 Anemia in chronic kidney disease: Secondary | ICD-10-CM | POA: Diagnosis not present

## 2020-03-25 DIAGNOSIS — N2581 Secondary hyperparathyroidism of renal origin: Secondary | ICD-10-CM | POA: Diagnosis not present

## 2020-03-25 DIAGNOSIS — Z992 Dependence on renal dialysis: Secondary | ICD-10-CM | POA: Diagnosis not present

## 2020-04-01 DIAGNOSIS — N2581 Secondary hyperparathyroidism of renal origin: Secondary | ICD-10-CM | POA: Diagnosis not present

## 2020-04-01 DIAGNOSIS — N186 End stage renal disease: Secondary | ICD-10-CM | POA: Diagnosis not present

## 2020-04-01 DIAGNOSIS — D509 Iron deficiency anemia, unspecified: Secondary | ICD-10-CM | POA: Diagnosis not present

## 2020-04-01 DIAGNOSIS — D631 Anemia in chronic kidney disease: Secondary | ICD-10-CM | POA: Diagnosis not present

## 2020-04-01 DIAGNOSIS — Z992 Dependence on renal dialysis: Secondary | ICD-10-CM | POA: Diagnosis not present

## 2020-04-02 ENCOUNTER — Telehealth (HOSPITAL_COMMUNITY): Payer: Self-pay

## 2020-04-02 NOTE — Telephone Encounter (Signed)
  [facility] ask for Vascular U/S Aorta and ABI, I sent this through [employer]hare.   [facility]

## 2020-04-03 DIAGNOSIS — D631 Anemia in chronic kidney disease: Secondary | ICD-10-CM | POA: Diagnosis not present

## 2020-04-03 DIAGNOSIS — D509 Iron deficiency anemia, unspecified: Secondary | ICD-10-CM | POA: Diagnosis not present

## 2020-04-03 DIAGNOSIS — N186 End stage renal disease: Secondary | ICD-10-CM | POA: Diagnosis not present

## 2020-04-03 DIAGNOSIS — Z992 Dependence on renal dialysis: Secondary | ICD-10-CM | POA: Diagnosis not present

## 2020-04-03 DIAGNOSIS — N2581 Secondary hyperparathyroidism of renal origin: Secondary | ICD-10-CM | POA: Diagnosis not present

## 2020-04-04 DIAGNOSIS — R110 Nausea: Secondary | ICD-10-CM | POA: Diagnosis not present

## 2020-04-04 DIAGNOSIS — K3184 Gastroparesis: Secondary | ICD-10-CM | POA: Diagnosis not present

## 2020-04-04 DIAGNOSIS — Z01812 Encounter for preprocedural laboratory examination: Secondary | ICD-10-CM | POA: Diagnosis not present

## 2020-04-04 DIAGNOSIS — Z20822 Contact with and (suspected) exposure to covid-19: Secondary | ICD-10-CM | POA: Diagnosis not present

## 2020-04-05 DIAGNOSIS — Z992 Dependence on renal dialysis: Secondary | ICD-10-CM | POA: Diagnosis not present

## 2020-04-05 DIAGNOSIS — D509 Iron deficiency anemia, unspecified: Secondary | ICD-10-CM | POA: Diagnosis not present

## 2020-04-05 DIAGNOSIS — D631 Anemia in chronic kidney disease: Secondary | ICD-10-CM | POA: Diagnosis not present

## 2020-04-05 DIAGNOSIS — N186 End stage renal disease: Secondary | ICD-10-CM | POA: Diagnosis not present

## 2020-04-05 DIAGNOSIS — N2581 Secondary hyperparathyroidism of renal origin: Secondary | ICD-10-CM | POA: Diagnosis not present

## 2020-04-08 DIAGNOSIS — N2581 Secondary hyperparathyroidism of renal origin: Secondary | ICD-10-CM | POA: Diagnosis not present

## 2020-04-08 DIAGNOSIS — Z992 Dependence on renal dialysis: Secondary | ICD-10-CM | POA: Diagnosis not present

## 2020-04-08 DIAGNOSIS — N186 End stage renal disease: Secondary | ICD-10-CM | POA: Diagnosis not present

## 2020-04-08 DIAGNOSIS — D509 Iron deficiency anemia, unspecified: Secondary | ICD-10-CM | POA: Diagnosis not present

## 2020-04-08 DIAGNOSIS — D631 Anemia in chronic kidney disease: Secondary | ICD-10-CM | POA: Diagnosis not present

## 2020-04-10 DIAGNOSIS — R110 Nausea: Secondary | ICD-10-CM | POA: Diagnosis not present

## 2020-04-10 DIAGNOSIS — E119 Type 2 diabetes mellitus without complications: Secondary | ICD-10-CM | POA: Diagnosis not present

## 2020-04-10 DIAGNOSIS — K3184 Gastroparesis: Secondary | ICD-10-CM | POA: Diagnosis not present

## 2020-04-11 DIAGNOSIS — E1122 Type 2 diabetes mellitus with diabetic chronic kidney disease: Secondary | ICD-10-CM | POA: Diagnosis not present

## 2020-04-11 DIAGNOSIS — Z992 Dependence on renal dialysis: Secondary | ICD-10-CM | POA: Diagnosis not present

## 2020-04-11 DIAGNOSIS — N186 End stage renal disease: Secondary | ICD-10-CM | POA: Diagnosis not present

## 2020-04-12 DIAGNOSIS — Z992 Dependence on renal dialysis: Secondary | ICD-10-CM | POA: Diagnosis not present

## 2020-04-12 DIAGNOSIS — D509 Iron deficiency anemia, unspecified: Secondary | ICD-10-CM | POA: Diagnosis not present

## 2020-04-12 DIAGNOSIS — D631 Anemia in chronic kidney disease: Secondary | ICD-10-CM | POA: Diagnosis not present

## 2020-04-12 DIAGNOSIS — N186 End stage renal disease: Secondary | ICD-10-CM | POA: Diagnosis not present

## 2020-04-12 DIAGNOSIS — N2581 Secondary hyperparathyroidism of renal origin: Secondary | ICD-10-CM | POA: Diagnosis not present

## 2020-04-15 DIAGNOSIS — N2581 Secondary hyperparathyroidism of renal origin: Secondary | ICD-10-CM | POA: Diagnosis not present

## 2020-04-15 DIAGNOSIS — D631 Anemia in chronic kidney disease: Secondary | ICD-10-CM | POA: Diagnosis not present

## 2020-04-15 DIAGNOSIS — N186 End stage renal disease: Secondary | ICD-10-CM | POA: Diagnosis not present

## 2020-04-15 DIAGNOSIS — Z992 Dependence on renal dialysis: Secondary | ICD-10-CM | POA: Diagnosis not present

## 2020-04-15 DIAGNOSIS — D509 Iron deficiency anemia, unspecified: Secondary | ICD-10-CM | POA: Diagnosis not present

## 2020-04-17 DIAGNOSIS — N186 End stage renal disease: Secondary | ICD-10-CM | POA: Diagnosis not present

## 2020-04-17 DIAGNOSIS — N2581 Secondary hyperparathyroidism of renal origin: Secondary | ICD-10-CM | POA: Diagnosis not present

## 2020-04-17 DIAGNOSIS — D631 Anemia in chronic kidney disease: Secondary | ICD-10-CM | POA: Diagnosis not present

## 2020-04-17 DIAGNOSIS — E1122 Type 2 diabetes mellitus with diabetic chronic kidney disease: Secondary | ICD-10-CM | POA: Diagnosis not present

## 2020-04-17 DIAGNOSIS — Z992 Dependence on renal dialysis: Secondary | ICD-10-CM | POA: Diagnosis not present

## 2020-04-17 DIAGNOSIS — D509 Iron deficiency anemia, unspecified: Secondary | ICD-10-CM | POA: Diagnosis not present

## 2020-04-19 DIAGNOSIS — N2581 Secondary hyperparathyroidism of renal origin: Secondary | ICD-10-CM | POA: Diagnosis not present

## 2020-04-19 DIAGNOSIS — D509 Iron deficiency anemia, unspecified: Secondary | ICD-10-CM | POA: Diagnosis not present

## 2020-04-19 DIAGNOSIS — Z992 Dependence on renal dialysis: Secondary | ICD-10-CM | POA: Diagnosis not present

## 2020-04-19 DIAGNOSIS — N186 End stage renal disease: Secondary | ICD-10-CM | POA: Diagnosis not present

## 2020-04-19 DIAGNOSIS — D631 Anemia in chronic kidney disease: Secondary | ICD-10-CM | POA: Diagnosis not present

## 2020-04-22 DIAGNOSIS — N2581 Secondary hyperparathyroidism of renal origin: Secondary | ICD-10-CM | POA: Diagnosis not present

## 2020-04-22 DIAGNOSIS — D631 Anemia in chronic kidney disease: Secondary | ICD-10-CM | POA: Diagnosis not present

## 2020-04-22 DIAGNOSIS — Z992 Dependence on renal dialysis: Secondary | ICD-10-CM | POA: Diagnosis not present

## 2020-04-22 DIAGNOSIS — N186 End stage renal disease: Secondary | ICD-10-CM | POA: Diagnosis not present

## 2020-04-22 DIAGNOSIS — D509 Iron deficiency anemia, unspecified: Secondary | ICD-10-CM | POA: Diagnosis not present

## 2020-04-24 DIAGNOSIS — N2581 Secondary hyperparathyroidism of renal origin: Secondary | ICD-10-CM | POA: Diagnosis not present

## 2020-04-24 DIAGNOSIS — D631 Anemia in chronic kidney disease: Secondary | ICD-10-CM | POA: Diagnosis not present

## 2020-04-24 DIAGNOSIS — N186 End stage renal disease: Secondary | ICD-10-CM | POA: Diagnosis not present

## 2020-04-24 DIAGNOSIS — Z992 Dependence on renal dialysis: Secondary | ICD-10-CM | POA: Diagnosis not present

## 2020-04-24 DIAGNOSIS — D509 Iron deficiency anemia, unspecified: Secondary | ICD-10-CM | POA: Diagnosis not present

## 2020-04-29 DIAGNOSIS — N186 End stage renal disease: Secondary | ICD-10-CM | POA: Diagnosis not present

## 2020-04-29 DIAGNOSIS — N2581 Secondary hyperparathyroidism of renal origin: Secondary | ICD-10-CM | POA: Diagnosis not present

## 2020-04-29 DIAGNOSIS — D631 Anemia in chronic kidney disease: Secondary | ICD-10-CM | POA: Diagnosis not present

## 2020-04-29 DIAGNOSIS — D509 Iron deficiency anemia, unspecified: Secondary | ICD-10-CM | POA: Diagnosis not present

## 2020-04-29 DIAGNOSIS — Z992 Dependence on renal dialysis: Secondary | ICD-10-CM | POA: Diagnosis not present

## 2020-05-01 DIAGNOSIS — N2581 Secondary hyperparathyroidism of renal origin: Secondary | ICD-10-CM | POA: Diagnosis not present

## 2020-05-01 DIAGNOSIS — N186 End stage renal disease: Secondary | ICD-10-CM | POA: Diagnosis not present

## 2020-05-01 DIAGNOSIS — D631 Anemia in chronic kidney disease: Secondary | ICD-10-CM | POA: Diagnosis not present

## 2020-05-01 DIAGNOSIS — D509 Iron deficiency anemia, unspecified: Secondary | ICD-10-CM | POA: Diagnosis not present

## 2020-05-01 DIAGNOSIS — Z992 Dependence on renal dialysis: Secondary | ICD-10-CM | POA: Diagnosis not present

## 2020-05-03 DIAGNOSIS — Z992 Dependence on renal dialysis: Secondary | ICD-10-CM | POA: Diagnosis not present

## 2020-05-03 DIAGNOSIS — D509 Iron deficiency anemia, unspecified: Secondary | ICD-10-CM | POA: Diagnosis not present

## 2020-05-03 DIAGNOSIS — D631 Anemia in chronic kidney disease: Secondary | ICD-10-CM | POA: Diagnosis not present

## 2020-05-03 DIAGNOSIS — N186 End stage renal disease: Secondary | ICD-10-CM | POA: Diagnosis not present

## 2020-05-03 DIAGNOSIS — N2581 Secondary hyperparathyroidism of renal origin: Secondary | ICD-10-CM | POA: Diagnosis not present

## 2020-05-05 ENCOUNTER — Other Ambulatory Visit: Payer: Self-pay | Admitting: Nephrology

## 2020-05-05 ENCOUNTER — Ambulatory Visit
Admission: RE | Admit: 2020-05-05 | Discharge: 2020-05-05 | Disposition: A | Discharge status: Home / Self Care | Payer: Medicare Other | Source: Ambulatory Visit | Attending: Nephrology | Admitting: Nephrology

## 2020-05-05 ENCOUNTER — Other Ambulatory Visit: Payer: Self-pay

## 2020-05-05 DIAGNOSIS — Z95828 Presence of other vascular implants and grafts: Secondary | ICD-10-CM | POA: Diagnosis not present

## 2020-05-05 DIAGNOSIS — M25511 Pain in right shoulder: Secondary | ICD-10-CM | POA: Diagnosis not present

## 2020-05-05 DIAGNOSIS — M25552 Pain in left hip: Secondary | ICD-10-CM | POA: Diagnosis not present

## 2020-05-05 DIAGNOSIS — Z8739 Personal history of other diseases of the musculoskeletal system and connective tissue: Secondary | ICD-10-CM | POA: Diagnosis not present

## 2020-05-05 DIAGNOSIS — R52 Pain, unspecified: Secondary | ICD-10-CM

## 2020-05-06 DIAGNOSIS — D631 Anemia in chronic kidney disease: Secondary | ICD-10-CM | POA: Diagnosis not present

## 2020-05-06 DIAGNOSIS — Z992 Dependence on renal dialysis: Secondary | ICD-10-CM | POA: Diagnosis not present

## 2020-05-06 DIAGNOSIS — D509 Iron deficiency anemia, unspecified: Secondary | ICD-10-CM | POA: Diagnosis not present

## 2020-05-06 DIAGNOSIS — N186 End stage renal disease: Secondary | ICD-10-CM | POA: Diagnosis not present

## 2020-05-06 DIAGNOSIS — N2581 Secondary hyperparathyroidism of renal origin: Secondary | ICD-10-CM | POA: Diagnosis not present

## 2020-05-10 DIAGNOSIS — N186 End stage renal disease: Secondary | ICD-10-CM | POA: Diagnosis not present

## 2020-05-10 DIAGNOSIS — Z992 Dependence on renal dialysis: Secondary | ICD-10-CM | POA: Diagnosis not present

## 2020-05-10 DIAGNOSIS — N2581 Secondary hyperparathyroidism of renal origin: Secondary | ICD-10-CM | POA: Diagnosis not present

## 2020-05-10 DIAGNOSIS — D509 Iron deficiency anemia, unspecified: Secondary | ICD-10-CM | POA: Diagnosis not present

## 2020-05-10 DIAGNOSIS — D631 Anemia in chronic kidney disease: Secondary | ICD-10-CM | POA: Diagnosis not present

## 2020-05-11 DIAGNOSIS — Z992 Dependence on renal dialysis: Secondary | ICD-10-CM | POA: Diagnosis not present

## 2020-05-11 DIAGNOSIS — N186 End stage renal disease: Secondary | ICD-10-CM | POA: Diagnosis not present

## 2020-05-11 DIAGNOSIS — E1122 Type 2 diabetes mellitus with diabetic chronic kidney disease: Secondary | ICD-10-CM | POA: Diagnosis not present

## 2020-05-13 DIAGNOSIS — N2581 Secondary hyperparathyroidism of renal origin: Secondary | ICD-10-CM | POA: Diagnosis not present

## 2020-05-13 DIAGNOSIS — E1122 Type 2 diabetes mellitus with diabetic chronic kidney disease: Secondary | ICD-10-CM | POA: Diagnosis not present

## 2020-05-13 DIAGNOSIS — D631 Anemia in chronic kidney disease: Secondary | ICD-10-CM | POA: Diagnosis not present

## 2020-05-13 DIAGNOSIS — Z992 Dependence on renal dialysis: Secondary | ICD-10-CM | POA: Diagnosis not present

## 2020-05-13 DIAGNOSIS — D509 Iron deficiency anemia, unspecified: Secondary | ICD-10-CM | POA: Diagnosis not present

## 2020-05-13 DIAGNOSIS — N186 End stage renal disease: Secondary | ICD-10-CM | POA: Diagnosis not present

## 2020-05-20 DIAGNOSIS — Z992 Dependence on renal dialysis: Secondary | ICD-10-CM | POA: Diagnosis not present

## 2020-05-20 DIAGNOSIS — N2581 Secondary hyperparathyroidism of renal origin: Secondary | ICD-10-CM | POA: Diagnosis not present

## 2020-05-20 DIAGNOSIS — N186 End stage renal disease: Secondary | ICD-10-CM | POA: Diagnosis not present

## 2020-05-20 DIAGNOSIS — D509 Iron deficiency anemia, unspecified: Secondary | ICD-10-CM | POA: Diagnosis not present

## 2020-05-20 DIAGNOSIS — E1122 Type 2 diabetes mellitus with diabetic chronic kidney disease: Secondary | ICD-10-CM | POA: Diagnosis not present

## 2020-05-20 DIAGNOSIS — D631 Anemia in chronic kidney disease: Secondary | ICD-10-CM | POA: Diagnosis not present

## 2020-05-22 DIAGNOSIS — D631 Anemia in chronic kidney disease: Secondary | ICD-10-CM | POA: Diagnosis not present

## 2020-05-22 DIAGNOSIS — D509 Iron deficiency anemia, unspecified: Secondary | ICD-10-CM | POA: Diagnosis not present

## 2020-05-22 DIAGNOSIS — N186 End stage renal disease: Secondary | ICD-10-CM | POA: Diagnosis not present

## 2020-05-22 DIAGNOSIS — Z992 Dependence on renal dialysis: Secondary | ICD-10-CM | POA: Diagnosis not present

## 2020-05-22 DIAGNOSIS — N2581 Secondary hyperparathyroidism of renal origin: Secondary | ICD-10-CM | POA: Diagnosis not present

## 2020-05-22 DIAGNOSIS — E1122 Type 2 diabetes mellitus with diabetic chronic kidney disease: Secondary | ICD-10-CM | POA: Diagnosis not present

## 2020-05-24 DIAGNOSIS — Z992 Dependence on renal dialysis: Secondary | ICD-10-CM | POA: Diagnosis not present

## 2020-05-24 DIAGNOSIS — N2581 Secondary hyperparathyroidism of renal origin: Secondary | ICD-10-CM | POA: Diagnosis not present

## 2020-05-24 DIAGNOSIS — E1122 Type 2 diabetes mellitus with diabetic chronic kidney disease: Secondary | ICD-10-CM | POA: Diagnosis not present

## 2020-05-24 DIAGNOSIS — N186 End stage renal disease: Secondary | ICD-10-CM | POA: Diagnosis not present

## 2020-05-24 DIAGNOSIS — D509 Iron deficiency anemia, unspecified: Secondary | ICD-10-CM | POA: Diagnosis not present

## 2020-05-24 DIAGNOSIS — D631 Anemia in chronic kidney disease: Secondary | ICD-10-CM | POA: Diagnosis not present

## 2020-05-27 DIAGNOSIS — D631 Anemia in chronic kidney disease: Secondary | ICD-10-CM | POA: Diagnosis not present

## 2020-05-27 DIAGNOSIS — Z992 Dependence on renal dialysis: Secondary | ICD-10-CM | POA: Diagnosis not present

## 2020-05-27 DIAGNOSIS — N2581 Secondary hyperparathyroidism of renal origin: Secondary | ICD-10-CM | POA: Diagnosis not present

## 2020-05-27 DIAGNOSIS — D509 Iron deficiency anemia, unspecified: Secondary | ICD-10-CM | POA: Diagnosis not present

## 2020-05-27 DIAGNOSIS — N186 End stage renal disease: Secondary | ICD-10-CM | POA: Diagnosis not present

## 2020-05-27 DIAGNOSIS — E1122 Type 2 diabetes mellitus with diabetic chronic kidney disease: Secondary | ICD-10-CM | POA: Diagnosis not present

## 2020-05-28 ENCOUNTER — Other Ambulatory Visit: Payer: Self-pay

## 2020-05-28 ENCOUNTER — Ambulatory Visit (INDEPENDENT_AMBULATORY_CARE_PROVIDER_SITE_OTHER): Payer: Medicare Other | Admitting: Physician Assistant

## 2020-05-28 ENCOUNTER — Ambulatory Visit (INDEPENDENT_AMBULATORY_CARE_PROVIDER_SITE_OTHER)
Admission: RE | Admit: 2020-05-28 | Discharge: 2020-05-28 | Disposition: A | Discharge status: Home / Self Care | Payer: Medicare Other | Source: Ambulatory Visit | Attending: Vascular Surgery | Admitting: Vascular Surgery

## 2020-05-28 ENCOUNTER — Ambulatory Visit (HOSPITAL_COMMUNITY)
Admission: RE | Admit: 2020-05-28 | Discharge: 2020-05-28 | Disposition: A | Discharge status: Home / Self Care | Payer: Medicare Other | Source: Ambulatory Visit | Attending: Vascular Surgery | Admitting: Vascular Surgery

## 2020-05-28 VITALS — BP 186/82 | HR 78 | Temp 98.2°F | Resp 20 | Ht 65.0 in | Wt 143.1 lb

## 2020-05-28 DIAGNOSIS — I739 Peripheral vascular disease, unspecified: Secondary | ICD-10-CM | POA: Diagnosis not present

## 2020-05-28 DIAGNOSIS — M25569 Pain in unspecified knee: Secondary | ICD-10-CM | POA: Diagnosis not present

## 2020-05-28 NOTE — Progress Notes (Signed)
Peripheral Arterial Disease Follow-Up   VASCULAR SURGERY ASSESSMENT & PLAN:   Susan Horton is a 50 y.o. female She underwent placement of second left EIA stent distal to previous stent. This was performed due to non-invasive study showing high velocity of pre-existing stent.   She underwent right SFA angioplasty with stent placement as well as left external iliac angioplasty with stent placement by Dr. Carlis Abbott for lifestyle limiting claudication on April 25, 2019.  Previous angioplasty and stent placement to left SFA on December 14, 2018 by Dr. Carlis Abbott for lifestyle limiting short distance claudication.  Stable peripheral arterial disease.  No significant change in ABIs as compared to 8 months ago.  [place] ABI on right with slight decrease in TBI by 0.11. Right SFA stent not interrogated. Patient is without life-style limiting claudication or rest pain. Continue optimal medical management of diabetes, hypertension and follow-up with primary care physician. Encouraged continued complete smoking cessation. Continue the following medications: Plavix, statin and aspirin. She is followed by Dr. Alvester Chou for bilateral carotid artery bruits andchronic diastolic heart failure. Follow-up in 9 months with appropriate studies.  We discussed going to annual surveillance if her exam, symptoms and studies are stable at that time.  SUBJECTIVE:   The patient has BLE pain when walking long distances. This does not interfere with her life-style. Sometimes the pain is burning, sometimes cold.  She states she has peripheral neuropathy and the symptoms are on-going. Denies skin loss or ulceration.  PHYSICAL EXAM:   Vitals:   05/28/20 0852  BP: (!) 186/82  Pulse: 78  Resp: 20  Temp: 98.2 F (36.8 C)  TempSrc: Temporal  SpO2: 100%  Weight: 143 lb 1.6 oz (64.9 kg)  Height: 5\' 5"  (1.651 m)    General appearance: Well-developed, well-nourished in no apparent distress Neurologic: Alert and oriented x  4. Cardiovascular: Heart rate and rhythm are regular.   Abdomen: No palpable pulsatile mass. Extremities: Skin intact.  Both feet are warm and well perfused.  Motor function and sensation intact Pulse exam: 2+ femoral, dorsalis pedis and radial pulses bilaterally   NON-INVASIVE VASCULAR STUDIES   05/28/2020 ABI/TBI Today's ABI Today's TBI Previous ABI Previous TBI Right [place] 0.58  0.69 right toe pressure 105 Left 1.04 0.61 1.12 0.73 left toe pressure 115  Duplex: Abdominal Aorta: Left external iliac stent is patent with 50-99% stenosis.    Stenosis: +-------------------------+---------------+  Location         Stenosis      +-------------------------+---------------+  Right Common Iliac    >50% stenosis   +-------------------------+---------------+  Left Common Iliac    >50% stenosis   +-------------------------+---------------+  Right External Iliac   >50% stenosis   +-------------------------+---------------+  Left External Iliac stent50-99% stenosis  +-------------------------+---------------+      PROBLEM LIST:    The patient's past medical history, past surgical history, family history, social history, allergy list and medication list are reviewed.   CURRENT MEDS:    Current Outpatient Medications:  .  albuterol (PROVENTIL HFA;VENTOLIN HFA) 108 (90 BASE) MCG/ACT inhaler, Inhale 1-2 puffs into the lungs every 6 (six) hours as needed for wheezing or shortness of breath. , Disp: , Rfl:  .  amLODipine (NORVASC) 10 MG tablet, Take 1 tablet (10 mg total) by mouth daily., Disp: 90 tablet, Rfl: 3 .  aspirin 81 MG chewable tablet, Chew 1 tablet (81 mg total) by mouth daily., Disp: 30 tablet, Rfl: 0 .  atorvastatin (LIPITOR) 40 MG tablet, TAKE 1 TABLET BY MOUTH EVERY DAY,  Disp: 90 tablet, Rfl: 3 .  B Complex-C-Zn-Folic Acid (DIALYVITE 518 WITH ZINC) 0.8 MG TABS, Take 1 tablet by mouth daily., Disp: , Rfl:  .  calcitRIOL (ROCALTROL) 0.5  MCG capsule, Take 1.5 mcg by mouth daily., Disp: , Rfl:  .  calcium acetate (PHOSLO) 667 MG capsule, Take 1,334-2,001 mg by mouth See admin instructions. Take 2001 mg by mouth up to three times daily with meals and 1334 mg with snacks, Disp: , Rfl: 10 .  clopidogrel (PLAVIX) 75 MG tablet, Take 1 tablet (75 mg total) by mouth daily with breakfast., Disp: 90 tablet, Rfl: 3 .  doxercalciferol (HECTOROL) 4 MCG/2ML injection, Doxercalciferol (Hectorol), Disp: , Rfl:  .  Fluticasone-Salmeterol (ADVAIR) 250-50 MCG/DOSE AEPB, Inhale 1 puff into the lungs 2 (two) times daily., Disp: 60 each, Rfl: 3 .  heparin 1000 unit/mL SOLN injection, Heparin Sodium (Porcine) 1,000 Units/mL Systemic, Disp: , Rfl:  .  iron sucrose (VENOFER) 20 MG/ML injection, Iron Sucrose (Venofer), Disp: , Rfl:  .  isosorbide mononitrate (IMDUR) 30 MG 24 hr tablet, Take 0.5 tablets (15 mg total) by mouth daily., Disp: 45 tablet, Rfl: 3 .  lidocaine-prilocaine (EMLA) cream, Apply 1 application topically as needed., Disp: 30 g, Rfl: 3 .  methocarbamol (ROBAXIN) 500 MG tablet, Take 1 tablet (500 mg total) by mouth 2 (two) times daily as needed for muscle spasms., Disp: 60 tablet, Rfl: 1 .  Methoxy PEG-Epoetin Beta (MIRCERA IJ), Inject into the skin., Disp: , Rfl:  .  metoCLOPramide (REGLAN) 5 MG tablet, Take 1 tablet (5 mg total) by mouth every 8 (eight) hours as needed for nausea., Disp: 15 tablet, Rfl: 0 .  metoprolol succinate (TOPROL-XL) 50 MG 24 hr tablet, Take 75 mg (1.5 tablet) daily, Disp: 135 tablet, Rfl: 3 .  montelukast (SINGULAIR) 10 MG tablet, Take 1 tablet (10 mg total) by mouth at bedtime., Disp: 30 tablet, Rfl: 5 .  nitroGLYCERIN (NITROSTAT) 0.4 MG SL tablet, Place 1 tablet (0.4 mg total) under the tongue every 5 (five) minutes as needed for chest pain., Disp: 25 tablet, Rfl: 2 .  ondansetron (ZOFRAN ODT) 4 MG disintegrating tablet, Take 1 tablet (4 mg total) by mouth every 8 (eight) hours as needed for nausea or vomiting.,  Disp: 20 tablet, Rfl: 0 .  pantoprazole (PROTONIX) 40 MG tablet, Take 1 tablet (40 mg total) by mouth daily., Disp: 30 tablet, Rfl: 0 .  PRALUENT 75 MG/ML SOAJ, INJECT 75 MG INTO THE SKIN EVERY 14 (FOURTEEN) DAYS., Disp: 2 mL, Rfl: 11 .  sucralfate (CARAFATE) 1 GM/10ML suspension, Take 10 mLs (1 g total) by mouth 4 (four) times daily -  with meals and at bedtime., Disp: 420 mL, Rfl: 0 .  trimethoprim-polymyxin b (POLYTRIM) ophthalmic solution, Place 1 drop into both eyes See admin instructions. Instill one drop into both eyes four times daily for 2 days after each monthly eye injection, Disp: , Rfl: 12 .  amitriptyline (ELAVIL) 10 MG tablet, Take 1 tablet (10 mg total) by mouth at bedtime. (Patient not taking: Reported on 05/28/2020), Disp: 30 tablet, Rfl: 1 .  hydrALAZINE (APRESOLINE) 25 MG tablet, Take 1 tablet (25 mg total) by mouth 3 (three) times daily. (Patient not taking: Reported on 05/28/2020), Disp: 270 tablet, Rfl: 3 .  insulin aspart (NOVOLOG) 100 UNIT/ML injection, 0-6 Units, Subcutaneous, 3 times daily with meals CBG < 70: Implement Hypoglycemia measures/call MD CBG 70 - 120: 0 units CBG 121 - 150: 0 units CBG 151 - 200: 1 unit  CBG 201-250: 2 units CBG 251-300: 3 units CBG 301-350: 4 units CBG 351-400: 5 units CBG > 400: Give 6 units and call MD (Patient not taking: Reported on 05/28/2020), Disp: 10 mL, Rfl: 0 .  insulin glargine, 2 Unit Dial, (TOUJEO MAX SOLOSTAR) 300 UNIT/ML Solostar Pen, Inject 54 Units into the skin at bedtime.  (Patient not taking: Reported on 05/28/2020), Disp: , Rfl:    REVIEW OF SYSTEMS:   [X]  denotes positive finding, [ ]  denotes negative finding Cardiac  Comments:  Chest pain or chest pressure:    Shortness of breath upon exertion:    Short of breath when lying flat:    Irregular heart rhythm:        Vascular    Pain in calf, thigh, or hip brought on by ambulation:    Pain in feet at night that wakes you up from your sleep:     Blood clot in your veins:     Leg swelling:         Pulmonary    Oxygen at home:    Productive cough:     Wheezing:         Neurologic    Sudden weakness in arms or legs:     Sudden numbness in arms or legs:     Sudden onset of difficulty speaking or slurred speech:    Temporary loss of vision in one eye:     Problems with dizziness:         Gastrointestinal    Blood in stool:     Vomited blood:         Genitourinary    Burning when urinating:     Blood in urine:        Psychiatric    Major depression:         Hematologic    Bleeding problems:    Problems with blood clotting too easily:        Skin    Rashes or ulcers:        Constitutional    Fever or chills:     Barbie Banner, PA-C  Office: [phone] 05/28/2020

## 2020-05-29 ENCOUNTER — Other Ambulatory Visit: Payer: Self-pay

## 2020-05-29 DIAGNOSIS — I739 Peripheral vascular disease, unspecified: Secondary | ICD-10-CM

## 2020-05-29 DIAGNOSIS — N2581 Secondary hyperparathyroidism of renal origin: Secondary | ICD-10-CM | POA: Diagnosis not present

## 2020-05-29 DIAGNOSIS — D509 Iron deficiency anemia, unspecified: Secondary | ICD-10-CM | POA: Diagnosis not present

## 2020-05-29 DIAGNOSIS — Z992 Dependence on renal dialysis: Secondary | ICD-10-CM | POA: Diagnosis not present

## 2020-05-29 DIAGNOSIS — N186 End stage renal disease: Secondary | ICD-10-CM | POA: Diagnosis not present

## 2020-05-29 DIAGNOSIS — E1122 Type 2 diabetes mellitus with diabetic chronic kidney disease: Secondary | ICD-10-CM | POA: Diagnosis not present

## 2020-05-29 DIAGNOSIS — D631 Anemia in chronic kidney disease: Secondary | ICD-10-CM | POA: Diagnosis not present

## 2020-05-31 DIAGNOSIS — N2581 Secondary hyperparathyroidism of renal origin: Secondary | ICD-10-CM | POA: Diagnosis not present

## 2020-05-31 DIAGNOSIS — N186 End stage renal disease: Secondary | ICD-10-CM | POA: Diagnosis not present

## 2020-05-31 DIAGNOSIS — D631 Anemia in chronic kidney disease: Secondary | ICD-10-CM | POA: Diagnosis not present

## 2020-05-31 DIAGNOSIS — Z992 Dependence on renal dialysis: Secondary | ICD-10-CM | POA: Diagnosis not present

## 2020-05-31 DIAGNOSIS — E1122 Type 2 diabetes mellitus with diabetic chronic kidney disease: Secondary | ICD-10-CM | POA: Diagnosis not present

## 2020-05-31 DIAGNOSIS — D509 Iron deficiency anemia, unspecified: Secondary | ICD-10-CM | POA: Diagnosis not present

## 2020-06-03 DIAGNOSIS — N2581 Secondary hyperparathyroidism of renal origin: Secondary | ICD-10-CM | POA: Diagnosis not present

## 2020-06-03 DIAGNOSIS — Z992 Dependence on renal dialysis: Secondary | ICD-10-CM | POA: Diagnosis not present

## 2020-06-03 DIAGNOSIS — D631 Anemia in chronic kidney disease: Secondary | ICD-10-CM | POA: Diagnosis not present

## 2020-06-03 DIAGNOSIS — N186 End stage renal disease: Secondary | ICD-10-CM | POA: Diagnosis not present

## 2020-06-03 DIAGNOSIS — D509 Iron deficiency anemia, unspecified: Secondary | ICD-10-CM | POA: Diagnosis not present

## 2020-06-03 DIAGNOSIS — E1122 Type 2 diabetes mellitus with diabetic chronic kidney disease: Secondary | ICD-10-CM | POA: Diagnosis not present

## 2020-06-05 DIAGNOSIS — E1122 Type 2 diabetes mellitus with diabetic chronic kidney disease: Secondary | ICD-10-CM | POA: Diagnosis not present

## 2020-06-05 DIAGNOSIS — D509 Iron deficiency anemia, unspecified: Secondary | ICD-10-CM | POA: Diagnosis not present

## 2020-06-05 DIAGNOSIS — D631 Anemia in chronic kidney disease: Secondary | ICD-10-CM | POA: Diagnosis not present

## 2020-06-05 DIAGNOSIS — N186 End stage renal disease: Secondary | ICD-10-CM | POA: Diagnosis not present

## 2020-06-05 DIAGNOSIS — N2581 Secondary hyperparathyroidism of renal origin: Secondary | ICD-10-CM | POA: Diagnosis not present

## 2020-06-05 DIAGNOSIS — Z992 Dependence on renal dialysis: Secondary | ICD-10-CM | POA: Diagnosis not present

## 2020-06-07 DIAGNOSIS — D631 Anemia in chronic kidney disease: Secondary | ICD-10-CM | POA: Diagnosis not present

## 2020-06-07 DIAGNOSIS — N186 End stage renal disease: Secondary | ICD-10-CM | POA: Diagnosis not present

## 2020-06-07 DIAGNOSIS — N2581 Secondary hyperparathyroidism of renal origin: Secondary | ICD-10-CM | POA: Diagnosis not present

## 2020-06-07 DIAGNOSIS — D509 Iron deficiency anemia, unspecified: Secondary | ICD-10-CM | POA: Diagnosis not present

## 2020-06-07 DIAGNOSIS — Z992 Dependence on renal dialysis: Secondary | ICD-10-CM | POA: Diagnosis not present

## 2020-06-07 DIAGNOSIS — E1122 Type 2 diabetes mellitus with diabetic chronic kidney disease: Secondary | ICD-10-CM | POA: Diagnosis not present

## 2020-06-10 DIAGNOSIS — N186 End stage renal disease: Secondary | ICD-10-CM | POA: Diagnosis not present

## 2020-06-10 DIAGNOSIS — E1122 Type 2 diabetes mellitus with diabetic chronic kidney disease: Secondary | ICD-10-CM | POA: Diagnosis not present

## 2020-06-10 DIAGNOSIS — N2581 Secondary hyperparathyroidism of renal origin: Secondary | ICD-10-CM | POA: Diagnosis not present

## 2020-06-10 DIAGNOSIS — D631 Anemia in chronic kidney disease: Secondary | ICD-10-CM | POA: Diagnosis not present

## 2020-06-10 DIAGNOSIS — D509 Iron deficiency anemia, unspecified: Secondary | ICD-10-CM | POA: Diagnosis not present

## 2020-06-10 DIAGNOSIS — Z992 Dependence on renal dialysis: Secondary | ICD-10-CM | POA: Diagnosis not present

## 2020-06-11 DIAGNOSIS — N186 End stage renal disease: Secondary | ICD-10-CM | POA: Diagnosis not present

## 2020-06-11 DIAGNOSIS — E1122 Type 2 diabetes mellitus with diabetic chronic kidney disease: Secondary | ICD-10-CM | POA: Diagnosis not present

## 2020-06-11 DIAGNOSIS — Z992 Dependence on renal dialysis: Secondary | ICD-10-CM | POA: Diagnosis not present

## 2020-06-12 DIAGNOSIS — R110 Nausea: Secondary | ICD-10-CM | POA: Diagnosis not present

## 2020-06-12 DIAGNOSIS — Z992 Dependence on renal dialysis: Secondary | ICD-10-CM | POA: Diagnosis not present

## 2020-06-12 DIAGNOSIS — N183 Chronic kidney disease, stage 3 unspecified: Secondary | ICD-10-CM | POA: Diagnosis not present

## 2020-06-12 DIAGNOSIS — Z794 Long term (current) use of insulin: Secondary | ICD-10-CM | POA: Diagnosis not present

## 2020-06-12 DIAGNOSIS — I129 Hypertensive chronic kidney disease with stage 1 through stage 4 chronic kidney disease, or unspecified chronic kidney disease: Secondary | ICD-10-CM | POA: Diagnosis not present

## 2020-06-12 DIAGNOSIS — K3184 Gastroparesis: Secondary | ICD-10-CM | POA: Diagnosis not present

## 2020-06-12 DIAGNOSIS — E1165 Type 2 diabetes mellitus with hyperglycemia: Secondary | ICD-10-CM | POA: Diagnosis not present

## 2020-06-12 DIAGNOSIS — E1122 Type 2 diabetes mellitus with diabetic chronic kidney disease: Secondary | ICD-10-CM | POA: Diagnosis not present

## 2020-06-14 DIAGNOSIS — D631 Anemia in chronic kidney disease: Secondary | ICD-10-CM | POA: Diagnosis not present

## 2020-06-14 DIAGNOSIS — N2581 Secondary hyperparathyroidism of renal origin: Secondary | ICD-10-CM | POA: Diagnosis not present

## 2020-06-14 DIAGNOSIS — D509 Iron deficiency anemia, unspecified: Secondary | ICD-10-CM | POA: Diagnosis not present

## 2020-06-14 DIAGNOSIS — N186 End stage renal disease: Secondary | ICD-10-CM | POA: Diagnosis not present

## 2020-06-14 DIAGNOSIS — Z992 Dependence on renal dialysis: Secondary | ICD-10-CM | POA: Diagnosis not present

## 2020-06-17 DIAGNOSIS — Z992 Dependence on renal dialysis: Secondary | ICD-10-CM | POA: Diagnosis not present

## 2020-06-17 DIAGNOSIS — D509 Iron deficiency anemia, unspecified: Secondary | ICD-10-CM | POA: Diagnosis not present

## 2020-06-17 DIAGNOSIS — N2581 Secondary hyperparathyroidism of renal origin: Secondary | ICD-10-CM | POA: Diagnosis not present

## 2020-06-17 DIAGNOSIS — N186 End stage renal disease: Secondary | ICD-10-CM | POA: Diagnosis not present

## 2020-06-17 DIAGNOSIS — D631 Anemia in chronic kidney disease: Secondary | ICD-10-CM | POA: Diagnosis not present

## 2020-06-19 DIAGNOSIS — N2581 Secondary hyperparathyroidism of renal origin: Secondary | ICD-10-CM | POA: Diagnosis not present

## 2020-06-19 DIAGNOSIS — Z992 Dependence on renal dialysis: Secondary | ICD-10-CM | POA: Diagnosis not present

## 2020-06-19 DIAGNOSIS — N186 End stage renal disease: Secondary | ICD-10-CM | POA: Diagnosis not present

## 2020-06-19 DIAGNOSIS — D631 Anemia in chronic kidney disease: Secondary | ICD-10-CM | POA: Diagnosis not present

## 2020-06-19 DIAGNOSIS — D509 Iron deficiency anemia, unspecified: Secondary | ICD-10-CM | POA: Diagnosis not present

## 2020-06-21 DIAGNOSIS — Z992 Dependence on renal dialysis: Secondary | ICD-10-CM | POA: Diagnosis not present

## 2020-06-21 DIAGNOSIS — N186 End stage renal disease: Secondary | ICD-10-CM | POA: Diagnosis not present

## 2020-06-21 DIAGNOSIS — D631 Anemia in chronic kidney disease: Secondary | ICD-10-CM | POA: Diagnosis not present

## 2020-06-21 DIAGNOSIS — N2581 Secondary hyperparathyroidism of renal origin: Secondary | ICD-10-CM | POA: Diagnosis not present

## 2020-06-21 DIAGNOSIS — D509 Iron deficiency anemia, unspecified: Secondary | ICD-10-CM | POA: Diagnosis not present

## 2020-06-24 DIAGNOSIS — N2581 Secondary hyperparathyroidism of renal origin: Secondary | ICD-10-CM | POA: Diagnosis not present

## 2020-06-24 DIAGNOSIS — D631 Anemia in chronic kidney disease: Secondary | ICD-10-CM | POA: Diagnosis not present

## 2020-06-24 DIAGNOSIS — Z992 Dependence on renal dialysis: Secondary | ICD-10-CM | POA: Diagnosis not present

## 2020-06-24 DIAGNOSIS — N186 End stage renal disease: Secondary | ICD-10-CM | POA: Diagnosis not present

## 2020-06-24 DIAGNOSIS — D509 Iron deficiency anemia, unspecified: Secondary | ICD-10-CM | POA: Diagnosis not present

## 2020-06-25 DIAGNOSIS — R059 Cough, unspecified: Secondary | ICD-10-CM | POA: Diagnosis not present

## 2020-06-25 DIAGNOSIS — R1110 Vomiting, unspecified: Secondary | ICD-10-CM | POA: Diagnosis not present

## 2020-06-26 DIAGNOSIS — N186 End stage renal disease: Secondary | ICD-10-CM | POA: Diagnosis not present

## 2020-06-26 DIAGNOSIS — D631 Anemia in chronic kidney disease: Secondary | ICD-10-CM | POA: Diagnosis not present

## 2020-06-26 DIAGNOSIS — D509 Iron deficiency anemia, unspecified: Secondary | ICD-10-CM | POA: Diagnosis not present

## 2020-06-26 DIAGNOSIS — Z992 Dependence on renal dialysis: Secondary | ICD-10-CM | POA: Diagnosis not present

## 2020-06-26 DIAGNOSIS — N2581 Secondary hyperparathyroidism of renal origin: Secondary | ICD-10-CM | POA: Diagnosis not present

## 2020-06-27 DIAGNOSIS — E1140 Type 2 diabetes mellitus with diabetic neuropathy, unspecified: Secondary | ICD-10-CM | POA: Diagnosis not present

## 2020-06-27 DIAGNOSIS — R109 Unspecified abdominal pain: Secondary | ICD-10-CM | POA: Diagnosis not present

## 2020-06-27 DIAGNOSIS — G8929 Other chronic pain: Secondary | ICD-10-CM | POA: Diagnosis not present

## 2020-06-28 DIAGNOSIS — Z992 Dependence on renal dialysis: Secondary | ICD-10-CM | POA: Diagnosis not present

## 2020-06-28 DIAGNOSIS — N186 End stage renal disease: Secondary | ICD-10-CM | POA: Diagnosis not present

## 2020-06-28 DIAGNOSIS — D509 Iron deficiency anemia, unspecified: Secondary | ICD-10-CM | POA: Diagnosis not present

## 2020-06-28 DIAGNOSIS — D631 Anemia in chronic kidney disease: Secondary | ICD-10-CM | POA: Diagnosis not present

## 2020-06-28 DIAGNOSIS — N2581 Secondary hyperparathyroidism of renal origin: Secondary | ICD-10-CM | POA: Diagnosis not present

## 2020-06-29 ENCOUNTER — Emergency Department (HOSPITAL_COMMUNITY): Payer: Medicare Other

## 2020-06-29 ENCOUNTER — Inpatient Hospital Stay (HOSPITAL_COMMUNITY)
Admission: EM | Admit: 2020-06-29 | Discharge: 2020-07-02 | DRG: 073 | Disposition: A | Discharge status: Home / Self Care | Payer: Medicare Other | Attending: Family Medicine | Admitting: Family Medicine

## 2020-06-29 ENCOUNTER — Encounter (HOSPITAL_COMMUNITY): Payer: Self-pay | Admitting: Emergency Medicine

## 2020-06-29 ENCOUNTER — Other Ambulatory Visit: Payer: Self-pay

## 2020-06-29 DIAGNOSIS — D631 Anemia in chronic kidney disease: Secondary | ICD-10-CM | POA: Diagnosis not present

## 2020-06-29 DIAGNOSIS — Z880 Allergy status to penicillin: Secondary | ICD-10-CM

## 2020-06-29 DIAGNOSIS — Z86718 Personal history of other venous thrombosis and embolism: Secondary | ICD-10-CM

## 2020-06-29 DIAGNOSIS — Z90710 Acquired absence of both cervix and uterus: Secondary | ICD-10-CM

## 2020-06-29 DIAGNOSIS — J690 Pneumonitis due to inhalation of food and vomit: Secondary | ICD-10-CM | POA: Diagnosis not present

## 2020-06-29 DIAGNOSIS — Z803 Family history of malignant neoplasm of breast: Secondary | ICD-10-CM | POA: Diagnosis not present

## 2020-06-29 DIAGNOSIS — R1310 Dysphagia, unspecified: Secondary | ICD-10-CM

## 2020-06-29 DIAGNOSIS — Z992 Dependence on renal dialysis: Secondary | ICD-10-CM

## 2020-06-29 DIAGNOSIS — E785 Hyperlipidemia, unspecified: Secondary | ICD-10-CM | POA: Diagnosis present

## 2020-06-29 DIAGNOSIS — Z888 Allergy status to other drugs, medicaments and biological substances status: Secondary | ICD-10-CM

## 2020-06-29 DIAGNOSIS — Z79899 Other long term (current) drug therapy: Secondary | ICD-10-CM

## 2020-06-29 DIAGNOSIS — Z20822 Contact with and (suspected) exposure to covid-19: Secondary | ICD-10-CM | POA: Diagnosis not present

## 2020-06-29 DIAGNOSIS — E1122 Type 2 diabetes mellitus with diabetic chronic kidney disease: Secondary | ICD-10-CM | POA: Diagnosis not present

## 2020-06-29 DIAGNOSIS — R1319 Other dysphagia: Secondary | ICD-10-CM | POA: Diagnosis not present

## 2020-06-29 DIAGNOSIS — I2510 Atherosclerotic heart disease of native coronary artery without angina pectoris: Secondary | ICD-10-CM | POA: Diagnosis present

## 2020-06-29 DIAGNOSIS — E1151 Type 2 diabetes mellitus with diabetic peripheral angiopathy without gangrene: Secondary | ICD-10-CM | POA: Diagnosis not present

## 2020-06-29 DIAGNOSIS — E860 Dehydration: Secondary | ICD-10-CM | POA: Diagnosis present

## 2020-06-29 DIAGNOSIS — K5909 Other constipation: Secondary | ICD-10-CM | POA: Diagnosis present

## 2020-06-29 DIAGNOSIS — R112 Nausea with vomiting, unspecified: Secondary | ICD-10-CM | POA: Diagnosis not present

## 2020-06-29 DIAGNOSIS — Z8616 Personal history of COVID-19: Secondary | ICD-10-CM | POA: Diagnosis not present

## 2020-06-29 DIAGNOSIS — Z87891 Personal history of nicotine dependence: Secondary | ICD-10-CM

## 2020-06-29 DIAGNOSIS — Z794 Long term (current) use of insulin: Secondary | ICD-10-CM

## 2020-06-29 DIAGNOSIS — I120 Hypertensive chronic kidney disease with stage 5 chronic kidney disease or end stage renal disease: Secondary | ICD-10-CM | POA: Diagnosis not present

## 2020-06-29 DIAGNOSIS — J189 Pneumonia, unspecified organism: Secondary | ICD-10-CM

## 2020-06-29 DIAGNOSIS — I132 Hypertensive heart and chronic kidney disease with heart failure and with stage 5 chronic kidney disease, or end stage renal disease: Secondary | ICD-10-CM | POA: Diagnosis not present

## 2020-06-29 DIAGNOSIS — D509 Iron deficiency anemia, unspecified: Secondary | ICD-10-CM | POA: Diagnosis not present

## 2020-06-29 DIAGNOSIS — E1143 Type 2 diabetes mellitus with diabetic autonomic (poly)neuropathy: Principal | ICD-10-CM | POA: Diagnosis present

## 2020-06-29 DIAGNOSIS — R188 Other ascites: Secondary | ICD-10-CM | POA: Diagnosis not present

## 2020-06-29 DIAGNOSIS — N2581 Secondary hyperparathyroidism of renal origin: Secondary | ICD-10-CM | POA: Diagnosis present

## 2020-06-29 DIAGNOSIS — Z8249 Family history of ischemic heart disease and other diseases of the circulatory system: Secondary | ICD-10-CM | POA: Diagnosis not present

## 2020-06-29 DIAGNOSIS — I5032 Chronic diastolic (congestive) heart failure: Secondary | ICD-10-CM | POA: Diagnosis not present

## 2020-06-29 DIAGNOSIS — K3184 Gastroparesis: Secondary | ICD-10-CM | POA: Diagnosis present

## 2020-06-29 DIAGNOSIS — R0602 Shortness of breath: Secondary | ICD-10-CM | POA: Diagnosis not present

## 2020-06-29 DIAGNOSIS — E1142 Type 2 diabetes mellitus with diabetic polyneuropathy: Secondary | ICD-10-CM | POA: Diagnosis present

## 2020-06-29 DIAGNOSIS — E875 Hyperkalemia: Secondary | ICD-10-CM | POA: Diagnosis not present

## 2020-06-29 DIAGNOSIS — R1013 Epigastric pain: Secondary | ICD-10-CM | POA: Diagnosis not present

## 2020-06-29 DIAGNOSIS — Z7902 Long term (current) use of antithrombotics/antiplatelets: Secondary | ICD-10-CM

## 2020-06-29 DIAGNOSIS — K219 Gastro-esophageal reflux disease without esophagitis: Secondary | ICD-10-CM | POA: Diagnosis present

## 2020-06-29 DIAGNOSIS — Z9049 Acquired absence of other specified parts of digestive tract: Secondary | ICD-10-CM | POA: Diagnosis not present

## 2020-06-29 DIAGNOSIS — N186 End stage renal disease: Secondary | ICD-10-CM

## 2020-06-29 DIAGNOSIS — R079 Chest pain, unspecified: Secondary | ICD-10-CM | POA: Diagnosis not present

## 2020-06-29 DIAGNOSIS — Z955 Presence of coronary angioplasty implant and graft: Secondary | ICD-10-CM

## 2020-06-29 DIAGNOSIS — I700 Atherosclerosis of aorta: Secondary | ICD-10-CM | POA: Diagnosis not present

## 2020-06-29 DIAGNOSIS — Z7982 Long term (current) use of aspirin: Secondary | ICD-10-CM

## 2020-06-29 LAB — BRAIN NATRIURETIC PEPTIDE: B Natriuretic Peptide: 4061 pg/mL — ABNORMAL HIGH (ref 0.0–100.0)

## 2020-06-29 LAB — RESP PANEL BY RT-PCR (FLU A&B, COVID) ARPGX2
Influenza A by PCR: NEGATIVE
Influenza B by PCR: NEGATIVE
SARS Coronavirus 2 by RT PCR: NEGATIVE

## 2020-06-29 LAB — CBC WITH DIFFERENTIAL/PLATELET
Abs Immature Granulocytes: 0.02 10*3/uL (ref 0.00–0.07)
Basophils Absolute: 0 10*3/uL (ref 0.0–0.1)
Basophils Relative: 1 %
Eosinophils Absolute: 0.1 10*3/uL (ref 0.0–0.5)
Eosinophils Relative: 1 %
HCT: 36.2 % (ref 36.0–46.0)
Hemoglobin: 10.6 g/dL — ABNORMAL LOW (ref 12.0–15.0)
Immature Granulocytes: 0 %
Lymphocytes Relative: 20 %
Lymphs Abs: 1.1 10*3/uL (ref 0.7–4.0)
MCH: 24 pg — ABNORMAL LOW (ref 26.0–34.0)
MCHC: 29.3 g/dL — ABNORMAL LOW (ref 30.0–36.0)
MCV: 81.9 fL (ref 80.0–100.0)
Monocytes Absolute: 0.4 10*3/uL (ref 0.1–1.0)
Monocytes Relative: 7 %
Neutro Abs: 3.9 10*3/uL (ref 1.7–7.7)
Neutrophils Relative %: 71 %
Platelets: 212 10*3/uL (ref 150–400)
RBC: 4.42 MIL/uL (ref 3.87–5.11)
RDW: 19.9 % — ABNORMAL HIGH (ref 11.5–15.5)
WBC: 5.5 10*3/uL (ref 4.0–10.5)
nRBC: 0 % (ref 0.0–0.2)

## 2020-06-29 LAB — COMPREHENSIVE METABOLIC PANEL
ALT: 24 U/L (ref 0–44)
AST: 21 U/L (ref 15–41)
Albumin: 3.3 g/dL — ABNORMAL LOW (ref 3.5–5.0)
Alkaline Phosphatase: 101 U/L (ref 38–126)
Anion gap: 14 (ref 5–15)
BUN: 26 mg/dL — ABNORMAL HIGH (ref 6–20)
CO2: 31 mmol/L (ref 22–32)
Calcium: 9.6 mg/dL (ref 8.9–10.3)
Chloride: 93 mmol/L — ABNORMAL LOW (ref 98–111)
Creatinine, Ser: 6.09 mg/dL — ABNORMAL HIGH (ref 0.44–1.00)
GFR, Estimated: 8 mL/min — ABNORMAL LOW (ref >60)
Glucose, Bld: 97 mg/dL (ref 70–99)
Potassium: 3.7 mmol/L (ref 3.5–5.1)
Sodium: 138 mmol/L (ref 135–145)
Total Bilirubin: 1 mg/dL (ref 0.3–1.2)
Total Protein: 7.1 g/dL (ref 6.5–8.1)

## 2020-06-29 LAB — LIPASE, BLOOD: Lipase: 25 U/L (ref 11–51)

## 2020-06-29 LAB — GLUCOSE, CAPILLARY: Glucose-Capillary: 106 mg/dL — ABNORMAL HIGH (ref 70–99)

## 2020-06-29 LAB — TROPONIN I (HIGH SENSITIVITY)
Troponin I (High Sensitivity): 4 ng/L (ref <18)
Troponin I (High Sensitivity): 4 ng/L (ref <18)

## 2020-06-29 MED ORDER — ONDANSETRON HCL 4 MG/2ML IJ SOLN
4.0000 mg | Freq: Once | INTRAMUSCULAR | Status: AC
  Administered 2020-06-29: 4 mg via INTRAVENOUS
  Filled 2020-06-29: qty 2

## 2020-06-29 MED ORDER — CLOPIDOGREL BISULFATE 75 MG PO TABS
75.0000 mg | ORAL_TABLET | Freq: Every day | ORAL | Status: DC
  Administered 2020-06-30 – 2020-07-02 (×3): 75 mg via ORAL
  Filled 2020-06-29 (×3): qty 1

## 2020-06-29 MED ORDER — ALBUTEROL SULFATE HFA 108 (90 BASE) MCG/ACT IN AERS
1.0000 | INHALATION_SPRAY | Freq: Four times a day (QID) | RESPIRATORY_TRACT | Status: DC | PRN
  Filled 2020-06-29: qty 6.7

## 2020-06-29 MED ORDER — FLUTICASONE FUROATE-VILANTEROL 200-25 MCG/INH IN AEPB
1.0000 | INHALATION_SPRAY | Freq: Every day | RESPIRATORY_TRACT | Status: DC
  Administered 2020-06-30 – 2020-07-02 (×2): 1 via RESPIRATORY_TRACT
  Filled 2020-06-29: qty 28

## 2020-06-29 MED ORDER — METOCLOPRAMIDE HCL 5 MG/ML IJ SOLN
5.0000 mg | Freq: Three times a day (TID) | INTRAMUSCULAR | Status: DC
  Administered 2020-06-29 – 2020-06-30 (×2): 5 mg via INTRAVENOUS
  Filled 2020-06-29 (×2): qty 2

## 2020-06-29 MED ORDER — INSULIN ASPART 100 UNIT/ML IJ SOLN: 0.0000 [IU] | Freq: Three times a day (TID) | INTRAMUSCULAR | Status: DC

## 2020-06-29 MED ORDER — LIDOCAINE-PRILOCAINE 2.5-2.5 % EX CREA: 1.0000 "application " | TOPICAL_CREAM | CUTANEOUS | Status: DC | PRN

## 2020-06-29 MED ORDER — POLYMYXIN B-TRIMETHOPRIM 10000-0.1 UNIT/ML-% OP SOLN: 1.0000 [drp] | OPHTHALMIC | Status: DC

## 2020-06-29 MED ORDER — MONTELUKAST SODIUM 10 MG PO TABS
10.0000 mg | ORAL_TABLET | Freq: Every day | ORAL | Status: DC
  Filled 2020-06-29 (×3): qty 1

## 2020-06-29 MED ORDER — HEPARIN SODIUM (PORCINE) 5000 UNIT/ML IJ SOLN
5000.0000 [IU] | Freq: Three times a day (TID) | INTRAMUSCULAR | Status: DC
  Administered 2020-06-29 – 2020-07-02 (×5): 5000 [IU] via SUBCUTANEOUS
  Filled 2020-06-29 (×7): qty 1

## 2020-06-29 MED ORDER — LEVOFLOXACIN IN D5W 750 MG/150ML IV SOLN
750.0000 mg | Freq: Once | INTRAVENOUS | Status: AC
  Administered 2020-06-29: 750 mg via INTRAVENOUS
  Filled 2020-06-29: qty 150

## 2020-06-29 MED ORDER — PANTOPRAZOLE SODIUM 40 MG IV SOLR
40.0000 mg | INTRAVENOUS | Status: DC
  Administered 2020-06-29: 40 mg via INTRAVENOUS
  Filled 2020-06-29: qty 40

## 2020-06-29 MED ORDER — METHOCARBAMOL 500 MG PO TABS
500.0000 mg | ORAL_TABLET | Freq: Two times a day (BID) | ORAL | Status: DC | PRN
  Administered 2020-06-29: 500 mg via ORAL
  Filled 2020-06-29: qty 1

## 2020-06-29 MED ORDER — SODIUM CHLORIDE 0.9 % IV SOLN
25.0000 mg | Freq: Once | INTRAVENOUS | Status: AC
  Administered 2020-06-29: 25 mg via INTRAVENOUS
  Filled 2020-06-29: qty 1

## 2020-06-29 MED ORDER — SUCRALFATE 1 GM/10ML PO SUSP
1.0000 g | Freq: Three times a day (TID) | ORAL | Status: DC
  Administered 2020-06-29 – 2020-06-30 (×3): 1 g via ORAL
  Filled 2020-06-29 (×7): qty 10

## 2020-06-29 MED ORDER — HYDROMORPHONE HCL 1 MG/ML IJ SOLN
1.0000 mg | Freq: Once | INTRAMUSCULAR | Status: AC
  Administered 2020-06-29: 1 mg via INTRAVENOUS
  Filled 2020-06-29: qty 1

## 2020-06-29 MED ORDER — HYDRALAZINE HCL 20 MG/ML IJ SOLN
10.0000 mg | Freq: Four times a day (QID) | INTRAMUSCULAR | Status: DC
  Administered 2020-06-29 – 2020-06-30 (×3): 10 mg via INTRAVENOUS
  Filled 2020-06-29 (×3): qty 1

## 2020-06-29 MED ORDER — CLONIDINE HCL 0.1 MG/24HR TD PTWK
0.1000 mg | MEDICATED_PATCH | TRANSDERMAL | Status: DC
  Administered 2020-06-29: 0.1 mg via TRANSDERMAL
  Filled 2020-06-29 (×2): qty 1

## 2020-06-29 MED ORDER — ASPIRIN 81 MG PO CHEW
81.0000 mg | CHEWABLE_TABLET | Freq: Every day | ORAL | Status: DC
  Administered 2020-06-30 – 2020-07-02 (×3): 81 mg via ORAL
  Filled 2020-06-29 (×3): qty 1

## 2020-06-29 NOTE — ED Notes (Signed)
Pt transported to CT ?

## 2020-06-29 NOTE — H&P (Signed)
History and Physical    Susan Horton:[id number] DOB: [birth year] DOA: 06/29/2020  PCP: Charlott Rakes, MD (Confirm with patient/family/NH records and if not entered, this has to be entered at [facility] point of entry) Patient coming from: Home  I have personally briefly reviewed patient's old medical records in [place]  Chief Complaint: Food stuck in my chest   HPI: Susan Horton is a 50 y.o. female with medical history significant of diabetic gastroparesis status post Botox injection, IDDM, ESRD on HD TTS, HTN, HLD, CAD with stenting, presented with intractable nauseous vomit.  Patient has had chronic gastroparesis with frequent flareup, she has been on Reglan and as needed Zofran, and recently she underwent EGD, and Botox injections on the pylorus on June 2.  Since the procedure, patient has experienced worsening of dysphagia, "all the food, no matter it is a candy or meal" [place] getting worse, she could only tolerate liquid diet for last 2 days however still feeling quite nauseous and vomited several times with stomach content.  Meantime she developed dry cough and shortness of breath.  She underwent hemodialysis yesterday and no improvement of breathing symptoms after dialysis.  No fever chills or chest pain or abdominal pains.  She has chronic constipated and no diarrhea.  Unable to take any of her blood pressure pills or other pills since yesterday her blood pressure significant elevated. ED Course: CT shows nonspecific fat stranding retroperitoneal area.  Surgeon consulted and recommended symptomatic management.  Review of Systems: As per HPI otherwise 14 point review of systems negative.    Past Medical History:  Diagnosis Date   Abnormal stress test    Anemia    Angio-edema    Asthma    CAD (coronary artery disease)    DES to mid LAD July 2018, residual moderate RCA disease   Cataract    CKD (chronic kidney disease) stage 4, GFR 15-29 ml/min (HCC)    Dialysis T/Th/Sa    DVT (deep venous thrombosis) ([place])    1996 during pregnancy, 2015 left leg   Eczema    Essential hypertension 12/11/2015   Gastroparesis    GERD (gastroesophageal reflux disease)    Gluteal abscess 12/27/2018   Hidradenitis    Migraine    Neuropathy    Peripheral vascular disease (HCC)    blood clot in leg   Progressive angina ([place]) 06/05/2014   Chest pain   Type 2 diabetes mellitus ([place])    Type II diabetes mellitus ([place])    Urticaria     Past Surgical History:  Procedure Laterality Date   A/V FISTULAGRAM N/A 06/22/2017   Procedure: A/V FISTULAGRAM - left arm;  Surgeon: [name], MD;  Location: [place] [facility];  Service: Cardiovascular;  Laterality: N/A;   ABDOMINAL AORTOGRAM W/LOWER EXTREMITY Bilateral 12/14/2018   Procedure: ABDOMINAL AORTOGRAM W/LOWER EXTREMITY;  Surgeon: [name], MD;  Location: [place] [facility];  Service: Cardiovascular;  Laterality: Bilateral;   ABDOMINAL AORTOGRAM W/LOWER EXTREMITY N/A 04/25/2019   Procedure: ABDOMINAL AORTOGRAM W/LOWER EXTREMITY;  Surgeon: [name], MD;  Location: [place] [facility];  Service: Cardiovascular;  Laterality: N/A;   ABDOMINAL AORTOGRAM W/LOWER EXTREMITY Left 07/18/2019   Procedure: ABDOMINAL AORTOGRAM W/LOWER EXTREMITY;  Surgeon: [name], MD;  Location: [place] [facility];  Service: Cardiovascular;  Laterality: Left;   ABDOMINOPLASTY     ADENOIDECTOMY     APPENDECTOMY  1995   AV FISTULA PLACEMENT Left 02/01/2017   Procedure: ARTERIOVENOUS  BRACHIOCEPHALIC (AV) FISTULA CREATION;  Surgeon: [name], MD;  Location[facility];  Service: Vascular;  Laterality: Left;   [place] N/A 08/05/2016   Procedure: Coronary Stent Intervention;  Surgeon: [name], MD;  Location: [place] [facility];  Service: Cardiovascular;  Laterality: N/A;   EXPLORATORY LAPAROTOMY  08/14/2005   lysis of  adhesions, drainage of tubo-ovarian abscess   FISTULA SUPERFICIALIZATION Left 09/14/2017   Procedure: FISTULA SUPERFICIALIZATION ARTERIOVENOUS FISTULA LEFT ARM;  Surgeon: [name], MD;  Location: [place];  Service: Vascular;  Laterality: Left;   FLEXIBLE SIGMOIDOSCOPY N/A 01/04/2019   Procedure: FLEXIBLE SIGMOIDOSCOPY;  Surgeon: Clarene Essex, MD;  Location: [place];  Service: Endoscopy;  Laterality: N/A;   HYDRADENITIS EXCISION  02/08/2011   Procedure: EXCISION HYDRADENITIS GROIN;  Surgeon: [name], MD;  Location: [place];  Service: General;  Laterality: N/A;  Excisioin of Hidradenitis Left groin   INCISION AND DRAINAGE ABSCESS N/A 01/11/2019   Procedure: INCISION AND DRAINAGE SACRAL ABSCESS;  Surgeon: Georganna Skeans, MD;  Location: [place];  Service: General;  Laterality: N/A;   INGUINAL HIDRADENITIS EXCISION  07/06/2010   bilateral   INSERTION OF DIALYSIS CATHETER N/A 09/14/2017   Procedure: INSERTION OF TUNNELED DIALYSIS CATHETER Right Internal Jugular;  Surgeon: [name], MD;  Location: [place];  Service: Vascular;  Laterality: N/A;   IR FLUORO GUIDE CV LINE RIGHT  03/01/2019   IR US GUIDE BX ASP/DRAIN  01/01/2019   IR US GUIDE VASC ACCESS RIGHT  03/01/2019   LEFT HEART CATH AND CORONARY ANGIOGRAPHY N/A 08/05/2016   Procedure: Left Heart Cath and Coronary Angiography;  Surgeon: [name], MD;  Location: [place] [facility];  Service: Cardiovascular;  Laterality: N/A;   PERIPHERAL VASCULAR INTERVENTION Left 12/14/2018   Procedure: PERIPHERAL VASCULAR INTERVENTION;  Surgeon: [name], MD;  Location: [place] [facility];  Service: Cardiovascular;  Laterality: Left;  SFA   PERIPHERAL VASCULAR INTERVENTION Left 04/25/2019   Procedure: PERIPHERAL VASCULAR INTERVENTION;  Surgeon: [name], MD;  Location: [place] [facility];  Service: Cardiovascular;  Laterality: Left;  right superficial femoral, and left iliac   PERIPHERAL  VASCULAR INTERVENTION Left 07/18/2019   Procedure: PERIPHERAL VASCULAR INTERVENTION;  Surgeon: [name], MD;  Location: [place] [facility];  Service: Cardiovascular;  Laterality: Left;  external iliac   REDUCTION MAMMAPLASTY  2002   TONSILLECTOMY     TUBAL LIGATION  1996   VAGINAL HYSTERECTOMY  08/04/2005   and cysto     reports that she quit smoking about 3 years ago. Her smoking use included cigarettes. She has never used smokeless tobacco. She reports current drug use. Frequency: 2.00 times per week. Drug: Marijuana. She reports that she does not drink alcohol.  Allergies  Allergen Reactions   Penicillins Anaphylaxis, Itching, Swelling, Rash and Other (See Comments)    Swelling of throat & whole mouth  Has patient had a PCN reaction causing immediate rash, facial/tongue/throat swelling, SOB or lightheadedness with hypotension: Yes Has patient had a PCN reaction causing severe rash involving mucus membranes or skin necrosis: No Has patient had a PCN reaction that required hospitalization: No Has patient had a PCN reaction occurring within the last 10 years: No If all of the above answers are "NO", then may proceed with Cephalosporin use.   Repatha [Evolocumab] Itching   Other Itching  Lisinopril Cough    Other reaction(s): Unknown    Family History  Problem Relation Age of Onset   Allergic rhinitis Mother    Hypertension Father    Allergic rhinitis Father    Hypertension Sister    Allergic rhinitis Sister    Hypertension Brother    Allergic rhinitis Brother    Breast cancer Cousin        x2     Prior to Admission medications   Medication Sig Start Date End Date Taking? Authorizing Provider  albuterol (PROVENTIL HFA;VENTOLIN HFA) 108 (90 BASE) MCG/ACT inhaler Inhale 1-2 puffs into the lungs every 6 (six) hours as needed for wheezing or shortness of breath.     [provider]  amitriptyline (ELAVIL) 10 MG tablet Take 1 tablet (10 mg total) by mouth at  bedtime. Patient not taking: Reported on 05/28/2020 09/19/18   Jamse Arn, MD  amLODipine (NORVASC) 10 MG tablet Take 1 tablet (10 mg total) by mouth daily. 08/06/19   [name], MD  aspirin 81 MG chewable tablet Chew 1 tablet (81 mg total) by mouth daily. 08/07/16   Cheryln Manly, NP  atorvastatin (LIPITOR) 40 MG tablet TAKE 1 TABLET BY MOUTH EVERY DAY 01/09/20   [name], MD  B Complex-C-Zn-Folic Acid (DIALYVITE 161 WITH ZINC) 0.8 MG TABS Take 1 tablet by mouth daily. 10/06/18   [provider]  calcitRIOL (ROCALTROL) 0.5 MCG capsule Take 1.5 mcg by mouth daily. 09/11/18   [provider]  calcium acetate (PHOSLO) 667 MG capsule Take 1,334-2,001 mg by mouth See admin instructions. Take 2001 mg by mouth up to three times daily with meals and 1334 mg with snacks 09/23/17   [provider]  clopidogrel (PLAVIX) 75 MG tablet Take 1 tablet (75 mg total) by mouth daily with breakfast. 08/06/19   [name], MD  doxercalciferol (HECTOROL) 4 MCG/2ML injection Doxercalciferol (Hectorol) 09/25/19 09/23/20  [provider]  Fluticasone-Salmeterol (ADVAIR) 250-50 MCG/DOSE AEPB Inhale 1 puff into the lungs 2 (two) times daily. 06/10/17   Charlott Rakes, MD  heparin 1000 unit/mL SOLN injection Heparin Sodium (Porcine) 1,000 Units/mL Systemic 08/14/19 08/12/20  [provider]  hydrALAZINE (APRESOLINE) 25 MG tablet Take 1 tablet (25 mg total) by mouth 3 (three) times daily. Patient not taking: Reported on 05/28/2020 08/06/19 11/04/19  [name], MD  insulin aspart (NOVOLOG) 100 UNIT/ML injection 0-6 Units, Subcutaneous, 3 times daily with meals CBG < 70: Implement Hypoglycemia measures/call MD CBG 70 - 120: 0 units CBG 121 - 150: 0 units CBG 151 - 200: 1 unit CBG 201-250: 2 units CBG 251-300: 3 units CBG 301-350: 4 units CBG 351-400: 5 units CBG > 400: Give 6 units and call MD Patient not taking: Reported on 05/28/2020 03/02/19   Jonetta Osgood, MD  insulin glargine, 2 Unit Dial, (TOUJEO MAX SOLOSTAR) 300 UNIT/ML Solostar Pen Inject 54 Units into the skin at bedtime.  Patient not taking: Reported on 05/28/2020    [provider]  iron sucrose (VENOFER) 20 MG/ML injection Iron Sucrose (Venofer) 07/11/19   [provider]  isosorbide mononitrate (IMDUR) 30 MG 24 hr tablet Take 0.5 tablets (15 mg total) by mouth daily. 08/06/19   [name], MD  lidocaine-prilocaine (EMLA) cream Apply 1 application topically as needed. 02/25/20   McDonald, Stephan Minister, DPM  methocarbamol (ROBAXIN) 500 MG tablet Take 1 tablet (500 mg total) by mouth 2 (two) times daily as needed for muscle  spasms. 09/07/17   Jamse Arn, MD  Methoxy PEG-Epoetin Beta (MIRCERA IJ) Inject into the skin. 07/09/19   [provider]  metoCLOPramide (REGLAN) 5 MG tablet Take 1 tablet (5 mg total) by mouth every 8 (eight) hours as needed for nausea. 03/22/20   Jacqlyn Larsen, PA-C  metoprolol succinate (TOPROL-XL) 50 MG 24 hr tablet Take 75 mg (1.5 tablet) daily 07/03/19   [name], MD  montelukast (SINGULAIR) 10 MG tablet Take 1 tablet (10 mg total) by mouth at bedtime. 04/12/18   Kennith Gain, MD  nitroGLYCERIN (NITROSTAT) 0.4 MG SL tablet Place 1 tablet (0.4 mg total) under the tongue every 5 (five) minutes as needed for chest pain. 08/06/19   [name], MD  ondansetron (ZOFRAN ODT) 4 MG disintegrating tablet Take 1 tablet (4 mg total) by mouth every 8 (eight) hours as needed for nausea or vomiting. 03/02/19   Ghimire, Henreitta Leber, MD  pantoprazole (PROTONIX) 40 MG tablet Take 1 tablet (40 mg total) by mouth daily. 03/22/20   Jacqlyn Larsen, PA-C  PRALUENT 75 MG/ML SOAJ INJECT 75 MG INTO THE SKIN EVERY 14 (FOURTEEN) DAYS. 02/07/20   [name], MD  sucralfate (CARAFATE) 1 GM/10ML suspension Take 10 mLs (1 g total) by mouth 4 (four) times daily -  with meals and at bedtime. 03/22/20   Jacqlyn Larsen, PA-C   trimethoprim-polymyxin b (POLYTRIM) ophthalmic solution Place 1 drop into both eyes See admin instructions. Instill one drop into both eyes four times daily for 2 days after each monthly eye injection 06/16/17   [provider]    Physical Exam: Vitals:   06/29/20 1715 06/29/20 1730 06/29/20 1800 06/29/20 1830  BP: (!) 182/61 (!) 179/66 (!) 170/78 (!) 188/80  Pulse: 83 88 81 87  Resp: (!) 25 14 10 14   Temp:      TempSrc:      SpO2:  98% 97% 99%  Weight:      Height:        Constitutional: NAD, calm, comfortable Vitals:   06/29/20 1715 06/29/20 1730 06/29/20 1800 06/29/20 1830  BP: (!) 182/61 (!) 179/66 (!) 170/78 (!) 188/80  Pulse: 83 88 81 87  Resp: (!) 25 14 10 14   Temp:      TempSrc:      SpO2:  98% 97% 99%  Weight:      Height:       Eyes: PERRL, lids and conjunctivae normal ENMT: Mucous membranes are dry. Posterior pharynx clear of any exudate or lesions.Normal dentition.  Neck: normal, supple, no masses, no thyromegaly Respiratory: clear to auscultation bilaterally, no wheezing, no crackles. Normal respiratory effort. No accessory muscle use.  Cardiovascular: Regular rate and rhythm, no murmurs / rubs / gallops. No extremity edema. 2+ pedal pulses. No carotid bruits.  Abdomen: no tenderness, no masses palpated. No hepatosplenomegaly. Bowel sounds positive.  Musculoskeletal: no clubbing / cyanosis. No joint deformity upper and lower extremities. Good ROM, no contractures. Normal muscle tone.  Skin: no rashes, lesions, ulcers. No induration Neurologic: CN 2-12 grossly intact. Sensation intact, DTR normal. Strength 5/5 in all 4.  Psychiatric: Normal judgment and insight. Alert and oriented x 3. Normal mood.     Labs on Admission: I have personally reviewed following labs and imaging studies  CBC: Recent Labs  Lab 06/29/20 1310  WBC 5.5  NEUTROABS 3.9  HGB 10.6*  HCT 36.2  MCV 81.9  PLT 580   Basic Metabolic Panel: Recent  Labs  Lab 06/29/20 1310   NA 138  K 3.7  CL 93*  CO2 31  GLUCOSE 97  BUN 26*  CREATININE 6.09*  CALCIUM 9.6   GFR: Estimated Creatinine Clearance: 10.1 mL/min (A) (by C-G formula based on SCr of 6.09 mg/dL (H)). Liver Function Tests: Recent Labs  Lab 06/29/20 1310  AST 21  ALT 24  ALKPHOS 101  BILITOT 1.0  PROT 7.1  ALBUMIN 3.3*   Recent Labs  Lab 06/29/20 1415  LIPASE 25   No results for input(s): AMMONIA in the last 168 hours. Coagulation Profile: No results for input(s): INR, PROTIME in the last 168 hours. Cardiac Enzymes: No results for input(s): CKTOTAL, CKMB, CKMBINDEX, TROPONINI in the last 168 hours. BNP (last 3 results) No results for input(s): PROBNP in the last 8760 hours. HbA1C: No results for input(s): HGBA1C in the last 72 hours. CBG: No results for input(s): GLUCAP in the last 168 hours. Lipid Profile: No results for input(s): CHOL, HDL, LDLCALC, TRIG, CHOLHDL, LDLDIRECT in the last 72 hours. Thyroid Function Tests: No results for input(s): TSH, T4TOTAL, FREET4, T3FREE, THYROIDAB in the last 72 hours. Anemia Panel: No results for input(s): VITAMINB12, FOLATE, FERRITIN, TIBC, IRON, RETICCTPCT in the last 72 hours. Urine analysis:    Component Value Date/Time   COLORURINE YELLOW 02/25/2019 0020   APPEARANCEUR CLOUDY (A) 02/25/2019 0020   LABSPEC 1.012 02/25/2019 0020   PHURINE 8.0 02/25/2019 0020   GLUCOSEU 50 (A) 02/25/2019 0020   HGBUR SMALL (A) 02/25/2019 0020   BILIRUBINUR NEGATIVE 02/25/2019 0020   BILIRUBINUR negative 01/13/2016 1135   KETONESUR 20 (A) 02/25/2019 0020   PROTEINUR 100 (A) 02/25/2019 0020   UROBILINOGEN 0.2 01/13/2016 1135   UROBILINOGEN 0.2 06/20/2014 1120   NITRITE NEGATIVE 02/25/2019 0020   LEUKOCYTESUR NEGATIVE 02/25/2019 0020    Radiological Exams on Admission: CT Abdomen Pelvis Wo Contrast  Result Date: 06/29/2020 CLINICAL DATA:  Suspected bowel obstruction. EXAM: CT ABDOMEN AND PELVIS WITHOUT CONTRAST TECHNIQUE: Multidetector CT  imaging of the abdomen and pelvis was performed following the standard protocol without IV contrast. COMPARISON:  February 24, 2019 FINDINGS: Lower chest: Small right pleural effusion. Calcific atherosclerotic disease of the aorta and coronary arteries. Mild peripheral ground-glass opacities in the lung bases. Hepatobiliary: No focal liver abnormality is seen. Status post cholecystectomy. No biliary dilatation. Pancreas: Unremarkable. No pancreatic ductal dilatation or surrounding inflammatory changes. Spleen: Normal in size without focal abnormality. Adrenals/Urinary Tract: Adrenal glands are unremarkable. Kidneys are normal, without renal calculi, focal lesion, or hydronephrosis. Bladder is unremarkable. Stomach/Bowel: Stomach is within normal limits. Appendix appears normal. No evidence of bowel wall thickening, distention, or inflammatory changes. Vascular/Lymphatic: Aortic atherosclerosis. No enlarged abdominal or pelvic lymph nodes. Reproductive: Status post hysterectomy. No adnexal masses. Other: Mesenteric and retroperitoneal fat stranding and small amount of free retroperitoneal fluid, mostly right-sided. Musculoskeletal: No acute or significant osseous findings. Coarse calcifications adjacent to the left greater trochanter, likely dystrophic. Soft tissue thickening and small amount of gas likely represent right sacral wound. No evidence of involvement of the underlying sacrum. IMPRESSION: 1. No evidence of small-bowel obstruction. 2. Mesenteric and retroperitoneal fat stranding and small amount of free retroperitoneal fluid, mostly right-sided. 3. Small right pleural effusion. 4. Mild peripheral ground-glass opacities in the lung bases, which may represent mild pulmonary edema or atypical consolidation. 5. Calcific atherosclerotic disease of the aorta and coronary arteries. 6. Right sacral wound. Aortic Atherosclerosis (ICD10-I70.0). Electronically Signed   By: Fidela Salisbury M.D.   On:  06/29/2020  15:27   DG Chest 2 View  Result Date: 06/29/2020 CLINICAL DATA:  Shortness of breath, chest pain. EXAM: CHEST - 2 VIEW COMPARISON:  Prior chest radiographs 03/22/2020 and earlier. FINDINGS: Heart size at the upper limits of normal. Aortic atherosclerosis. Ill-defined opacity within the left lung base. Unchanged nonspecific prominence of the minor fissure. No evidence of pleural effusion or pneumothorax. No acute bony abnormality identified. Surgical clips within the right upper quadrant of the abdomen. IMPRESSION: Ill-defined opacity within the left lung base, suspicious for pneumonia given the provided history. Followup PA and lateral chest radiographs are recommended in 3-4 weeks following a trial of antibiotic therapy to ensure resolution and exclude alternative etiologies (i.e. underlying malignancy). Aortic Atherosclerosis (ICD10-I70.0). Electronically Signed   By: Kellie Simmering DO   On: 06/29/2020 13:40    EKG: Independently reviewed. Sinus, no acute ST-T changes.  Assessment/Plan Active Problems:   Intractable nausea and vomiting  (please populate well all problems here in Problem List. (For example, if patient is on BP meds at home and you resume or decide to hold them, it is a problem that needs to be her. Same for CAD, COPD, HLD and so on)  Intractable nausea with vomiting and dysphagia -Appears to have peristalsis issue along with worsening symptoms of gastroparesis. -Since patient appeared to have trouble swallowing pills, will switch some of the medications to IV injections.  Change p.o. Reglan and Zofran to IV form. -Continue PPI. -Order barium swallow study for tomorrow. -Texted [facility] Dr. Loletha Carrow  Question of aspiration PNA -Probably related to repeated nauseous vomit, received 1 dose of Levaquin in ED, re-dose after 48 hours.  HTN uncontrolled -Likely secondary to noncompliant with BP meds due to swallowing problems.   -IV hydralazine every 8 hours, and clonidine patch  until patient can swallow.  ESRD on HD -Euvolemic to mild dehydration, had scheduled dialysis yesterday, next session Tuesday.  CAD -Stable  Diabetic gastroparesis -As above.  IDDM -Sliding scale  DVT prophylaxis: Heparin subcu Code Status: Full code Family Communication: None at bedside Disposition Plan: Expect 1 to 2 days hospital stay Consults called: [facility] Admission status: MedSur GIg admi   Lequita Halt MD [facility] Pager [phone]  06/29/2020, 7:20 PM

## 2020-06-29 NOTE — ED Provider Notes (Signed)
[facility] EMERGENCY DEPARTMENT Provider Note   CSN: [id number] Arrival date & time: 06/29/20  1258     History Chief Complaint  Patient presents with   Shortness of Breath    Susan Horton is a 50 y.o. female.  Susan Horton presents to the ED complaining of chest pain, upper abdominal pain, and shortness of breath secondary to her longstanding gastroparesis and gastrointestinal pathology.  She is followed by digestive health specialists and had endoscopy with Botox injection earlier this month.  She states that the problems she experiences now are her chronic condition, but she feels a lot worse than normal.  She has established care with a pain clinic, and they are considering some injections to potentially help her.  They are exploring whether or not her symptoms are related to thoracic neuropathy.  She states that nothing has really helped her symptoms.  She complains of dysphagia to solids and liquids.  She does dialyze, Tuesday, Thursday, and Saturday.  She denies any fluid retention.  The history is provided by the patient.  Chest Pain Pain location:  Substernal area and epigastric Pain quality comment:  Fullness Pain radiates to:  Does not radiate Pain severity:  Severe Onset quality:  Gradual Duration: chronic condition, worse x today. Timing:  Constant Progression:  Worsening Chronicity:  New Context comment:  Long history of gastroparesis and esophageal pathology Relieved by:  Nothing Exacerbated by: swallowing. Ineffective treatments:  None tried Associated symptoms: abdominal pain (upper), dysphagia (solids and liquids), nausea and shortness of breath (feels like there is fullness when she lies down)   Associated symptoms: no back pain, no cough, no fever, no lower extremity edema, no palpitations and no vomiting       Past Medical History:  Diagnosis Date   Abnormal stress test    Anemia    Angio-edema    Asthma    CAD (coronary artery  disease)    DES to mid LAD July 2018, residual moderate RCA disease   Cataract    CKD (chronic kidney disease) stage 4, GFR 15-29 ml/min (HCC)    Dialysis T/Th/Sa   DVT (deep venous thrombosis) ([place])    1996 during pregnancy, 2015 left leg   Eczema    Essential hypertension 12/11/2015   Gastroparesis    GERD (gastroesophageal reflux disease)    Gluteal abscess 12/27/2018   Hidradenitis    Migraine    Neuropathy    Peripheral vascular disease (HCC)    blood clot in leg   Progressive angina (HCC) 06/05/2014   Chest pain   Type 2 diabetes mellitus ([place])    Type II diabetes mellitus ([place])    Urticaria     Patient Active Problem List   Diagnosis Date Noted   Nausea 09/12/2019   Diarrhea, unspecified 09/12/2019   Pruritus, unspecified 08/13/2019   Anaphylactic shock, unspecified, sequela 08/13/2019   Allergy, unspecified, sequela 08/13/2019   Unspecified cataract 03/19/2019   Unspecified asthma with (acute) exacerbation 03/19/2019   COVID-19 03/06/2019   Intractable nausea and vomiting 02/24/2019   Epigastric abdominal pain 02/24/2019   Multifocal pneumonia 02/24/2019   Shortness of breath 01/19/2019   Cerebrovascular small vessel disease    Generalized weakness    Abscess of sacrum (HCC)    Gluteal abscess 12/25/2018   Moderate protein-calorie malnutrition ([place]) 12/19/2018   Other disorders resulting from impaired renal tubular function 12/19/2018   Liver disease, unspecified 12/19/2018   Hyperkalemia 12/19/2018   Acidosis 12/19/2018  Hyperlipidemia 11/10/2018   History of arteriosclerotic vascular disease 11/10/2018   Carotid stenosis 11/10/2018   Symptomatic anemia 09/16/2018   Peripheral vascular disease, unspecified ([place]) 09/04/2018   Other disorders of bilirubin metabolism 01/20/2018   Other long term (current) drug therapy 11/28/2017   Other abnormal findings in urine 11/28/2017   Dependence on renal dialysis ([place]) 11/28/2017   ESRD on peritoneal dialysis  ([place]) 14/48/1856   Complication of vascular dialysis catheter 09/15/2017   History of anemia due to CKD 07/18/2017   DVT (deep vein thrombosis) in pregnancy 07/18/2017   Encounter for removal of sutures 07/07/2017   Iron deficiency anemia, unspecified 06/30/2017   Secondary hyperparathyroidism of renal origin ([place]) 06/24/2017   Pain, unspecified 06/24/2017   Fever, unspecified 06/24/2017   End stage renal disease ([place]) 06/24/2017   Coagulation defect, unspecified ([place]) 06/24/2017   Anemia in chronic kidney disease 06/24/2017   Chronic diastolic CHF (congestive heart failure) ([place]) 06/18/2017   Moderate persistent asthma 06/10/2017   Adjustment disorder with depressed mood 09/08/2016   CKD (chronic kidney disease) stage 4, GFR 15-29 ml/min (HCC)    CAD S/P mLAD PCI with DES 08/06/2016   Presence of drug coated stent in LAD coronary artery 08/06/2016   Atherosclerotic heart disease of native coronary artery without angina pectoris 07/11/2016   Carotid bruit present 06/15/2016   Dyslipidemia 06/15/2016   Smoker 06/15/2016   Neuropathy 03/10/2016   Essential hypertension, benign 12/11/2015   Left facial pain 03/04/2015   Abnormal sense of taste 03/04/2015   Gastroparesis    Uncontrolled type 2 diabetes mellitus with peripheral neuropathy ([place]) 06/05/2014   Yeast infection of the vagina 07/10/2010    Past Surgical History:  Procedure Laterality Date   A/V FISTULAGRAM N/A 06/22/2017   Procedure: A/V FISTULAGRAM - left arm;  Surgeon: [name], MD;  Location: [facility];  Service: Cardiovascular;  Laterality: N/A;   ABDOMINAL AORTOGRAM W/LOWER EXTREMITY Bilateral 12/14/2018   Procedure: ABDOMINAL AORTOGRAM W/LOWER EXTREMITY;  Surgeon: [name], MD;  Location: [facility];  Service: Cardiovascular;  Laterality: Bilateral;   ABDOMINAL AORTOGRAM W/LOWER EXTREMITY N/A 04/25/2019   Procedure: ABDOMINAL AORTOGRAM W/LOWER EXTREMITY;  Surgeon: [name], MD;  Location: [facility];  Service: Cardiovascular;  Laterality: N/A;   ABDOMINAL AORTOGRAM W/LOWER EXTREMITY Left 07/18/2019   Procedure: ABDOMINAL AORTOGRAM W/LOWER EXTREMITY;  Surgeon: [name], MD;  Location: [facility];  Service: Cardiovascular;  Laterality: Left;   ABDOMINOPLASTY     ADENOIDECTOMY     APPENDECTOMY  1995   AV FISTULA PLACEMENT Left 02/01/2017   Procedure: ARTERIOVENOUS BRACHIOCEPHALIC (AV) FISTULA CREATION;  Surgeon: [name], MD;  Location: [facility];  Service: Vascular;  Laterality: Left;   [place] INTERVENTION N/A 08/05/2016   Procedure: Coronary Stent Intervention;  Surgeon: [name], MD;  Location: [facility];  Service: Cardiovascular;  Laterality: N/A;   EXPLORATORY LAPAROTOMY  08/14/2005   lysis of adhesions, drainage of tubo-ovarian abscess   FISTULA SUPERFICIALIZATION Left 09/14/2017   Procedure: FISTULA SUPERFICIALIZATION ARTERIOVENOUS FISTULA LEFT ARM;  Surgeon: [name], MD;  Location: [facility];  Service: Vascular;  Laterality: Left;   FLEXIBLE SIGMOIDOSCOPY N/A 01/04/2019   Procedure: FLEXIBLE SIGMOIDOSCOPY;  Surgeon: Clarene Essex, MD;  Location: [facility];  Service: Endoscopy;  Laterality: N/A;   HYDRADENITIS EXCISION  02/08/2011   Procedure: EXCISION  HYDRADENITIS GROIN;  Surgeon: [name], MD;  Location: [facility];  Service: General;  Laterality: N/A;  Excisioin of Hidradenitis Left groin   INCISION AND DRAINAGE ABSCESS N/A 01/11/2019   Procedure: INCISION AND DRAINAGE SACRAL ABSCESS;  Surgeon: Georganna Skeans, MD;  Location: [facility];  Service: General;  Laterality: N/A;   INGUINAL HIDRADENITIS EXCISION  07/06/2010   bilateral   INSERTION OF DIALYSIS CATHETER N/A 09/14/2017   Procedure: INSERTION OF TUNNELED DIALYSIS CATHETER Right Internal Jugular;  Surgeon: [name], MD;   Location: [facility];  Service: Vascular;  Laterality: N/A;   IR FLUORO GUIDE CV LINE RIGHT  03/01/2019   IR US GUIDE BX ASP/DRAIN  01/01/2019   IR US GUIDE VASC ACCESS RIGHT  03/01/2019   LEFT HEART CATH AND CORONARY ANGIOGRAPHY N/A 08/05/2016   Procedure: Left Heart Cath and Coronary Angiography;  Surgeon: [name], MD;  Location: [facility];  Service: Cardiovascular;  Laterality: N/A;   PERIPHERAL VASCULAR INTERVENTION Left 12/14/2018   Procedure: PERIPHERAL VASCULAR INTERVENTION;  Surgeon: [name], MD;  Location: [facility];  Service: Cardiovascular;  Laterality: Left;  SFA   PERIPHERAL VASCULAR INTERVENTION Left 04/25/2019   Procedure: PERIPHERAL VASCULAR INTERVENTION;  Surgeon: [name], MD;  Location: [facility];  Service: Cardiovascular;  Laterality: Left;  right superficial femoral, and left iliac   PERIPHERAL VASCULAR INTERVENTION Left 07/18/2019   Procedure: PERIPHERAL VASCULAR INTERVENTION;  Surgeon: [name], MD;  Location: [facility];  Service: Cardiovascular;  Laterality: Left;  external iliac   REDUCTION MAMMAPLASTY  2002   TONSILLECTOMY     TUBAL LIGATION  1996   VAGINAL HYSTERECTOMY  08/04/2005   and cysto     OB History   No obstetric history on file.     Family History  Problem Relation Age of Onset   Allergic rhinitis Mother    Hypertension Father    Allergic rhinitis Father    Hypertension Sister    Allergic rhinitis Sister    Hypertension Brother    Allergic rhinitis Brother    Breast cancer Cousin        x2    Social History   Tobacco Use   Smoking status: Former    Years: 20.00    Pack years: 0.00    Types: Cigarettes    Quit date: 11/2016    Years since quitting: 3.6   Smokeless tobacco: Never  Vaping Use   Vaping Use: Never used  Substance Use Topics   Alcohol use: No    Alcohol/week: 0.0 standard drinks   Drug use: Yes    Frequency: 2.0 times per week    Types: Marijuana     Home Medications Prior to Admission medications   Medication Sig Start Date End Date Taking? Authorizing Provider  albuterol (PROVENTIL HFA;VENTOLIN HFA) 108 (90 BASE) MCG/ACT inhaler Inhale 1-2 puffs into the lungs every 6 (six) hours as needed for wheezing or shortness of breath.     [provider]  amitriptyline (ELAVIL) 10 MG tablet Take 1 tablet (10 mg total) by mouth at bedtime. Patient not taking: Reported on 05/28/2020 09/19/18   Jamse Arn, MD  amLODipine (NORVASC) 10 MG tablet Take 1 tablet (10 mg total) by mouth daily. 08/06/19   [name], MD  aspirin 81 MG chewable tablet Chew 1 tablet (81 mg total) by mouth daily. 08/07/16   Cheryln Manly, NP  atorvastatin (  LIPITOR) 40 MG tablet TAKE 1 TABLET BY MOUTH EVERY DAY 01/09/20   [name], MD  B Complex-C-Zn-Folic Acid (DIALYVITE 704 WITH ZINC) 0.8 MG TABS Take 1 tablet by mouth daily. 10/06/18   [provider]  calcitRIOL (ROCALTROL) 0.5 MCG capsule Take 1.5 mcg by mouth daily. 09/11/18   [provider]  calcium acetate (PHOSLO) 667 MG capsule Take 1,334-2,001 mg by mouth See admin instructions. Take 2001 mg by mouth up to three times daily with meals and 1334 mg with snacks 09/23/17   [provider]  clopidogrel (PLAVIX) 75 MG tablet Take 1 tablet (75 mg total) by mouth daily with breakfast. 08/06/19   [name], MD  doxercalciferol (HECTOROL) 4 MCG/2ML injection Doxercalciferol (Hectorol) 09/25/19 09/23/20  [provider]  Fluticasone-Salmeterol (ADVAIR) 250-50 MCG/DOSE AEPB Inhale 1 puff into the lungs 2 (two) times daily. 06/10/17   Charlott Rakes, MD  heparin 1000 unit/mL SOLN injection Heparin Sodium (Porcine) 1,000 Units/mL Systemic 08/14/19 08/12/20  [provider]  hydrALAZINE (APRESOLINE) 25 MG tablet Take 1 tablet (25 mg total) by mouth 3 (three) times daily. Patient not taking: Reported on 05/28/2020 08/06/19 11/04/19  [name], MD   insulin aspart (NOVOLOG) 100 UNIT/ML injection 0-6 Units, Subcutaneous, 3 times daily with meals CBG < 70: Implement Hypoglycemia measures/call MD CBG 70 - 120: 0 units CBG 121 - 150: 0 units CBG 151 - 200: 1 unit CBG 201-250: 2 units CBG 251-300: 3 units CBG 301-350: 4 units CBG 351-400: 5 units CBG > 400: Give 6 units and call MD Patient not taking: Reported on 05/28/2020 03/02/19   Jonetta Osgood, MD  insulin glargine, 2 Unit Dial, (TOUJEO MAX SOLOSTAR) 300 UNIT/ML Solostar Pen Inject 54 Units into the skin at bedtime.  Patient not taking: Reported on 05/28/2020    [provider]  iron sucrose (VENOFER) 20 MG/ML injection Iron Sucrose (Venofer) 07/11/19   [provider]  isosorbide mononitrate (IMDUR) 30 MG 24 hr tablet Take 0.5 tablets (15 mg total) by mouth daily. 08/06/19   [name], MD  lidocaine-prilocaine (EMLA) cream Apply 1 application topically as needed. 02/25/20   McDonald, Stephan Minister, DPM  methocarbamol (ROBAXIN) 500 MG tablet Take 1 tablet (500 mg total) by mouth 2 (two) times daily as needed for muscle spasms. 09/07/17   Jamse Arn, MD  Methoxy PEG-Epoetin Beta (MIRCERA IJ) Inject into the skin. 07/09/19   [provider]  metoCLOPramide (REGLAN) 5 MG tablet Take 1 tablet (5 mg total) by mouth every 8 (eight) hours as needed for nausea. 03/22/20   Jacqlyn Larsen, PA-C  metoprolol succinate (TOPROL-XL) 50 MG 24 hr tablet Take 75 mg (1.5 tablet) daily 07/03/19   [name], MD  montelukast (SINGULAIR) 10 MG tablet Take 1 tablet (10 mg total) by mouth at bedtime. 04/12/18   Kennith Gain, MD  nitroGLYCERIN (NITROSTAT) 0.4 MG SL tablet Place 1 tablet (0.4 mg total) under the tongue every 5 (five) minutes as needed for chest pain. 08/06/19   [name], MD  ondansetron (ZOFRAN ODT) 4 MG disintegrating tablet Take 1 tablet (4 mg total) by mouth every 8 (eight) hours as needed for nausea or vomiting. 03/02/19   Ghimire, Henreitta Leber, MD   pantoprazole (PROTONIX) 40 MG tablet Take 1 tablet (40 mg total) by mouth daily. 03/22/20   Jacqlyn Larsen, PA-C  PRALUENT 75 MG/ML SOAJ INJECT 75 MG INTO THE SKIN EVERY 14 (FOURTEEN) DAYS.  02/07/20   [name], MD  sucralfate (CARAFATE) 1 GM/10ML suspension Take 10 mLs (1 g total) by mouth 4 (four) times daily -  with meals and at bedtime. 03/22/20   Jacqlyn Larsen, PA-C  trimethoprim-polymyxin b (POLYTRIM) ophthalmic solution Place 1 drop into both eyes See admin instructions. Instill one drop into both eyes four times daily for 2 days after each monthly eye injection 06/16/17   [provider]    Allergies    Penicillins, Repatha [evolocumab], Other, and Lisinopril  Review of Systems   Review of Systems  Constitutional:  Negative for chills and fever.  HENT:  Positive for trouble swallowing (solids and liquids). Negative for ear pain and sore throat.   Eyes:  Negative for pain and visual disturbance.  Respiratory:  Positive for shortness of breath (feels like there is fullness when she lies down). Negative for cough.   Cardiovascular:  Positive for chest pain. Negative for palpitations.  Gastrointestinal:  Positive for abdominal pain (upper) and nausea. Negative for vomiting.  Genitourinary:  Negative for dysuria and hematuria.  Musculoskeletal:  Negative for arthralgias and back pain.  Skin:  Negative for color change and rash.  Neurological:  Negative for seizures and syncope.  All other systems reviewed and are negative.  Physical Exam Updated Vital Signs BP (!) 206/84 (BP Location: Right Arm)   Pulse 80   Temp 98.2 F (36.8 C) (Oral)   Resp (!) 26   Ht 5\' 5"  (1.651 m)   Wt 68 kg   SpO2 100%   BMI 24.96 kg/m   Physical Exam Vitals and nursing note reviewed.  Constitutional:      General: She is in acute distress.     Appearance: She is well-developed.     Comments: Appears to be in pain, uncomfortable  HENT:     Head: Normocephalic and atraumatic.   Eyes:     Conjunctiva/sclera: Conjunctivae normal.  Cardiovascular:     Rate and Rhythm: Normal rate and regular rhythm.     Heart sounds: No murmur heard. Pulmonary:     Effort: Pulmonary effort is normal. Tachypnea present. No respiratory distress.     Breath sounds: Normal breath sounds.  Abdominal:     Palpations: Abdomen is soft.     Tenderness: There is abdominal tenderness.     Comments: Mild epigastric tenderness, no guarding  Musculoskeletal:     Cervical back: Neck supple.  Skin:    General: Skin is warm and dry.  Neurological:     General: No focal deficit present.     Mental Status: She is alert.    ED Results / Procedures / Treatments   Labs (all labs ordered are listed, but only abnormal results are displayed) Labs Reviewed  COMPREHENSIVE METABOLIC PANEL - Abnormal; Notable for the following components:      Result Value   Chloride 93 (*)    BUN 26 (*)    Creatinine, Ser 6.09 (*)    Albumin 3.3 (*)    GFR, Estimated 8 (*)    All other components within normal limits  CBC WITH DIFFERENTIAL/PLATELET - Abnormal; Notable for the following components:   Hemoglobin 10.6 (*)    MCH 24.0 (*)    MCHC 29.3 (*)    RDW 19.9 (*)    All other components within normal limits  BRAIN NATRIURETIC PEPTIDE - Abnormal; Notable for the following components:   B Natriuretic Peptide 4,061.0 (*)    All other components within  normal limits  RESP PANEL BY RT-PCR (FLU A&B, COVID) ARPGX2  LIPASE, BLOOD  HIV ANTIBODY (ROUTINE TESTING W REFLEX)  HEMOGLOBIN A1C  TROPONIN I (HIGH SENSITIVITY)  TROPONIN I (HIGH SENSITIVITY)    EKG EKG Interpretation  Date/Time:  Sunday June 29 2020 13:08:41 EDT Ventricular Rate:  82 PR Interval:  160 QRS Duration: 82 QT Interval:  378 QTC Calculation: 441 R Axis:   72 Text Interpretation: Normal sinus rhythm Normal ECG normal axis No acute ischemia Confirmed by Lorre Munroe ([id number]) on 06/29/2020 2:13:46 PM  Radiology CT Abdomen Pelvis Wo  Contrast  Result Date: 06/29/2020 CLINICAL DATA:  Suspected bowel obstruction. EXAM: CT ABDOMEN AND PELVIS WITHOUT CONTRAST TECHNIQUE: Multidetector CT imaging of the abdomen and pelvis was performed following the standard protocol without IV contrast. COMPARISON:  February 24, 2019 FINDINGS: Lower chest: Small right pleural effusion. Calcific atherosclerotic disease of the aorta and coronary arteries. Mild peripheral ground-glass opacities in the lung bases. Hepatobiliary: No focal liver abnormality is seen. Status post cholecystectomy. No biliary dilatation. Pancreas: Unremarkable. No pancreatic ductal dilatation or surrounding inflammatory changes. Spleen: Normal in size without focal abnormality. Adrenals/Urinary Tract: Adrenal glands are unremarkable. Kidneys are normal, without renal calculi, focal lesion, or hydronephrosis. Bladder is unremarkable. Stomach/Bowel: Stomach is within normal limits. Appendix appears normal. No evidence of bowel wall thickening, distention, or inflammatory changes. Vascular/Lymphatic: Aortic atherosclerosis. No enlarged abdominal or pelvic lymph nodes. Reproductive: Status post hysterectomy. No adnexal masses. Other: Mesenteric and retroperitoneal fat stranding and small amount of free retroperitoneal fluid, mostly right-sided. Musculoskeletal: No acute or significant osseous findings. Coarse calcifications adjacent to the left greater trochanter, likely dystrophic. Soft tissue thickening and small amount of gas likely represent right sacral wound. No evidence of involvement of the underlying sacrum. IMPRESSION: 1. No evidence of small-bowel obstruction. 2. Mesenteric and retroperitoneal fat stranding and small amount of free retroperitoneal fluid, mostly right-sided. 3. Small right pleural effusion. 4. Mild peripheral ground-glass opacities in the lung bases, which may represent mild pulmonary edema or atypical consolidation. 5. Calcific atherosclerotic disease of the aorta and  coronary arteries. 6. Right sacral wound. Aortic Atherosclerosis (ICD10-I70.0). Electronically Signed   By: Fidela Salisbury M.D.   On: 06/29/2020 15:27   DG Chest 2 View  Result Date: 06/29/2020 CLINICAL DATA:  Shortness of breath, chest pain. EXAM: CHEST - 2 VIEW COMPARISON:  Prior chest radiographs 03/22/2020 and earlier. FINDINGS: Heart size at the upper limits of normal. Aortic atherosclerosis. Ill-defined opacity within the left lung base. Unchanged nonspecific prominence of the minor fissure. No evidence of pleural effusion or pneumothorax. No acute bony abnormality identified. Surgical clips within the right upper quadrant of the abdomen. IMPRESSION: Ill-defined opacity within the left lung base, suspicious for pneumonia given the provided history. Followup PA and lateral chest radiographs are recommended in 3-4 weeks following a trial of antibiotic therapy to ensure resolution and exclude alternative etiologies (i.e. underlying malignancy). Aortic Atherosclerosis (ICD10-I70.0). Electronically Signed   By: Kellie Simmering DO   On: 06/29/2020 13:40    Procedures Procedures   Medications Ordered in ED Medications  levofloxacin (LEVAQUIN) IVPB 750 mg (has no administration in time range)  aspirin chewable tablet 81 mg (has no administration in time range)  hydrALAZINE (APRESOLINE) injection 10 mg (has no administration in time range)  metoCLOPramide (REGLAN) injection 5 mg (has no administration in time range)  sucralfate (CARAFATE) 1 GM/10ML suspension 1 g (has no administration in time range)  pantoprazole (PROTONIX) injection 40 mg (has no administration  in time range)  clopidogrel (PLAVIX) tablet 75 mg (has no administration in time range)  methocarbamol (ROBAXIN) tablet 500 mg (has no administration in time range)  albuterol (VENTOLIN HFA) 108 (90 Base) MCG/ACT inhaler 1-2 puff (has no administration in time range)  fluticasone furoate-vilanterol (BREO ELLIPTA) 200-25 MCG/INH 1 puff  (has no administration in time range)  montelukast (SINGULAIR) tablet 10 mg (has no administration in time range)  lidocaine-prilocaine (EMLA) cream 1 application (has no administration in time range)  trimethoprim-polymyxin b (POLYTRIM) ophthalmic solution 1 drop (has no administration in time range)  cloNIDine (CATAPRES - Dosed in mg/24 hr) patch 0.1 mg (has no administration in time range)  heparin injection 5,000 Units (has no administration in time range)  insulin aspart (novoLOG) injection 0-6 Units (has no administration in time range)  HYDROmorphone (DILAUDID) injection 1 mg (1 mg Intravenous Given 06/29/20 1416)  ondansetron (ZOFRAN) injection 4 mg (4 mg Intravenous Given 06/29/20 1416)  HYDROmorphone (DILAUDID) injection 1 mg (1 mg Intravenous Given 06/29/20 1703)  promethazine (PHENERGAN) 25 mg in sodium chloride 0.9 % 50 mL IVPB (0 mg Intravenous Stopped 06/29/20 1732)    ED Course  I have reviewed the triage vital signs and the nursing notes.  Pertinent labs & imaging results that were available during my care of the patient were reviewed by me and considered in my medical decision making (see chart for details).  Clinical Course as of 06/29/20 2012  Sun Jun 29, 2020  1620 Sacral wound noted on CT abdomen and pelvis.  I talked to the patient, and she reports falling about a year ago.  She has had a small wound there ever since.  On exam, she has a tiny, healing wound in the coccygeal area.  No erythema, fluctuance, or drainage [AW]  1631 Spoke with Dr. Kieth Brightly who does not think that the retroperitoneal fluid/stranding is clinically significant. [AW]  1840 I spoke with Dr. Roosevelt Locks who will admit. [AW]    Clinical Course User Index [AW] Arnaldo Natal, MD   MDM Rules/Calculators/A&P                          Susan Horton has numerous chronic medical conditions including end-stage renal disease on dialysis, coronary artery disease, hypertension, congestive heart failure, chronic  pain, gastroparesis.  She presented with a variety of acute and chronic symptoms.  She was endorsing nausea, vomiting, dysphagia, chest pain, shortness of breath, and abdominal pain.  It seemed initially that most of her symptoms were related to her ongoing gastrointestinal pathology.  I had initially hoped to obtain a swallow study, but the patient's ongoing nausea and vomiting made this difficult.    She was placed on 2 L of oxygen during her ED course for some hypoxia that developed.  She was found to have a left-sided pneumonia and was given Levaquin.    She will be admitted for further evaluation and treatment due to her O2 requirement and ongoing nausea/vomiting.  At this point, it does not appear she has an acute pathology in her abdomen although I did speak with general surgery about some nonspecific findings in her retroperitoneum.  Final Clinical Impression(s) / ED Diagnoses Final diagnoses:  Dysphagia  Community acquired pneumonia of left lower lobe of lung  Intractable vomiting with nausea, unspecified vomiting type  Chronic diastolic CHF (congestive heart failure) (HCC)  End stage renal disease ([place])  Gastroparesis    Rx / DC Orders ED  Discharge Orders     None        Arnaldo Natal, MD 06/29/20 2015

## 2020-06-29 NOTE — ED Triage Notes (Addendum)
Pt here for shob x 1 month. Hx gasatroparesis, feels like things are full in her chest. Denies chest pain/tightness. Pt has labored breathing in triage. Hx MI stents placed, on a blood thinner but doesn't know which one. Dialysis Tue Thurs Sat, last went yesterday.

## 2020-06-29 NOTE — ED Provider Notes (Signed)
Emergency Medicine Provider Triage Evaluation Note  Susan Horton , a 50 y.o. female  was evaluated in triage.  Pt complains of SOB for the past several months, worsening over the past month. Pt has hx of gastroparesis and at first thought the SOB Was related to the vomiting she was doing. She sees GI and states they have treated her gastroparesis but not her SOB. It is worse at nighttime while laying flat but also worse with exertion. She denies any chest pain however has hx of stenting. On plavix. She also mentions hx of DVT in LLE about 5 years ago however is unsure if she is on a blood thinner. Per chart review pt on Plavix likely for her stent. Denies fevers or chills. NO leg swelling. Denies hx of CHF.  Review of Systems  Positive: + SOB, cough Negative: - chest pain, fevers  Physical Exam  BP (!) 206/84 (BP Location: Right Arm)   Pulse 80   Resp (!) 26   Ht 5\' 5"  (1.651 m)   Wt 68 kg   SpO2 100%   BMI 24.96 kg/m  Gen:   Awake, no distress   Resp:  Tachypneic but able to speak in full sentences MSK:   Moves extremities without difficulty  Other:    Medical Decision Making  Medically screening exam initiated at 1:10 PM.  Appropriate orders placed.  [name] was informed that the remainder of the evaluation will be completed by another provider, this initial triage assessment does not replace that evaluation, and the importance of remaining in the ED until their evaluation is complete.     Eustaquio Maize, PA-C 06/29/20 1312    Arnaldo Natal, MD 06/29/20 [phone]

## 2020-06-30 ENCOUNTER — Inpatient Hospital Stay (HOSPITAL_COMMUNITY): Payer: Medicare Other

## 2020-06-30 DIAGNOSIS — R112 Nausea with vomiting, unspecified: Secondary | ICD-10-CM

## 2020-06-30 DIAGNOSIS — K3184 Gastroparesis: Secondary | ICD-10-CM

## 2020-06-30 DIAGNOSIS — R1013 Epigastric pain: Secondary | ICD-10-CM

## 2020-06-30 DIAGNOSIS — R1310 Dysphagia, unspecified: Secondary | ICD-10-CM

## 2020-06-30 DIAGNOSIS — E1143 Type 2 diabetes mellitus with diabetic autonomic (poly)neuropathy: Principal | ICD-10-CM

## 2020-06-30 LAB — HIV ANTIBODY (ROUTINE TESTING W REFLEX): HIV Screen 4th Generation wRfx: NONREACTIVE

## 2020-06-30 LAB — PROCALCITONIN: Procalcitonin: 0.22 ng/mL

## 2020-06-30 MED ORDER — HYDRALAZINE HCL 25 MG PO TABS
25.0000 mg | ORAL_TABLET | Freq: Three times a day (TID) | ORAL | Status: DC
  Administered 2020-06-30 – 2020-07-01 (×3): 25 mg via ORAL
  Filled 2020-06-30 (×3): qty 1

## 2020-06-30 MED ORDER — CYCLOBENZAPRINE HCL 10 MG PO TABS
10.0000 mg | ORAL_TABLET | Freq: Three times a day (TID) | ORAL | Status: DC
  Administered 2020-06-30 – 2020-07-02 (×6): 10 mg via ORAL
  Filled 2020-06-30 (×6): qty 1

## 2020-06-30 MED ORDER — METOCLOPRAMIDE HCL 5 MG/ML IJ SOLN
10.0000 mg | Freq: Three times a day (TID) | INTRAMUSCULAR | Status: DC
  Administered 2020-06-30 – 2020-07-02 (×6): 10 mg via INTRAVENOUS
  Filled 2020-06-30 (×6): qty 2

## 2020-06-30 MED ORDER — PANTOPRAZOLE SODIUM 40 MG PO TBEC
40.0000 mg | DELAYED_RELEASE_TABLET | Freq: Every day | ORAL | Status: DC
  Administered 2020-06-30 – 2020-07-01 (×2): 40 mg via ORAL
  Filled 2020-06-30 (×2): qty 1

## 2020-06-30 NOTE — Consult Note (Addendum)
Consultation  Referring Provider:  [facility]/ Dwyane Dee Primary Care Physician:  Charlott Rakes, MD Primary Gastroenterologist:  Dr.Koch / [facility]  Reason for Consultation: Intractable nausea and vomiting and SOB  HPI: Susan Horton is a 50 y.o. female, who was admitted yesterday evening after presenting to the emergency room with complaints of sensation of food stuck in her chest. Patient has history of chronic diabetic gastroparesis, followed at [facility]/Dr. Derrill Kay.  Patient also with end-stage renal disease, on hemodialysis, insulin-dependent diabetes mellitus, hypertension, coronary artery disease status post prior stents, she is status post cholecystectomy and appendectomy. She underwent upper endoscopy at [facility] on 06/12/2020 with Botox injections to the pylorus and then balloon dilation of the pylorus for treatment of her chronic gastroparesis. Patient presented to the emergency room last night with complaints of progressive dysphagia, nausea and vomiting, over the past couple of weeks.  She is also complaining of progressive shortness of breath over the past month, and says she had a difficult time lying flat to sleep.  She has been coughing a lot. She does not think that she had any improvement in her symptoms at all after this last endoscopy with Botox and dilation.  She has a difficult time describing her symptoms but says that everything she eats or drinks seems to be "sitting in my chest".  She is also complaining of fullness and a knot sensation in her upper abdomen and does not feel that her stomach is emptying.  She will vomit up foamy mucoid material.  No significant heartburn or indigestion type symptoms.  She is a bit unclear about her medications but believes that she takes Reglan 3 times daily before her meals.  She is also having significant difficulty with constipation despite being on MiraLAX, stool softeners and another liquid laxative that she cannot recall the name of it  is not on her med list.  Initial labs WBC 5.5/hemoglobin 10.6/hematocrit 36.2, LFTs within normal limits,  BNP 4000 COVID screen negative Chest x-ray question left lower lobe pneumonia CT of the abdomen pelvis shows a small right effusion, normal-appearing stomach, there is some mesenteric and retroperitoneal fat stranding and a small amount of free fluid in the retroperitoneum mostly right-sided.  Barium swallow is pending this morning.     Past Medical History:  Diagnosis Date   Abnormal stress test    Anemia    Angio-edema    Asthma    CAD (coronary artery disease)    DES to mid LAD July 2018, residual moderate RCA disease   Cataract    CKD (chronic kidney disease) stage 4, GFR 15-29 ml/min (HCC)    Dialysis T/Th/Sa   DVT (deep venous thrombosis) ([place])    1996 during pregnancy, 2015 left leg   Eczema    Essential hypertension 12/11/2015   Gastroparesis    GERD (gastroesophageal reflux disease)    Gluteal abscess 12/27/2018   Hidradenitis    Migraine    Neuropathy    Peripheral vascular disease (HCC)    blood clot in leg   Progressive angina ([place]) 06/05/2014   Chest pain   Type 2 diabetes mellitus ([place])    Type II diabetes mellitus ([place])    Urticaria     Past Surgical History:  Procedure Laterality Date   A/V FISTULAGRAM N/A 06/22/2017   Procedure: A/V FISTULAGRAM - left arm;  Surgeon: [name], MD;  Location: [place] [facility];  Service: Cardiovascular;  Laterality: N/A;   ABDOMINAL AORTOGRAM W/LOWER  EXTREMITY Bilateral 12/14/2018   Procedure: ABDOMINAL AORTOGRAM W/LOWER EXTREMITY;  Surgeon: [name], MD;  Location: [place] [facility];  Service: Cardiovascular;  Laterality: Bilateral;   ABDOMINAL AORTOGRAM W/LOWER EXTREMITY N/A 04/25/2019   Procedure: ABDOMINAL AORTOGRAM W/LOWER EXTREMITY;  Surgeon: [name], MD;  Location: [place] [facility];  Service: Cardiovascular;  Laterality: N/A;   ABDOMINAL AORTOGRAM W/LOWER EXTREMITY  Left 07/18/2019   Procedure: ABDOMINAL AORTOGRAM W/LOWER EXTREMITY;  Surgeon: [name], MD;  Location: [place] [facility];  Service: Cardiovascular;  Laterality: Left;   ABDOMINOPLASTY     ADENOIDECTOMY     APPENDECTOMY  1995   AV FISTULA PLACEMENT Left 02/01/2017   Procedure: ARTERIOVENOUS BRACHIOCEPHALIC (AV) FISTULA CREATION;  Surgeon: [name], MD;  Location: [place] [facility];  Service: Vascular;  Laterality: Left;   [place] INTERVENTION N/A 08/05/2016   Procedure: Coronary Stent Intervention;  Surgeon: [name], MD;  Location: [place] [facility];  Service: Cardiovascular;  Laterality: N/A;   EXPLORATORY LAPAROTOMY  08/14/2005   lysis of adhesions, drainage of tubo-ovarian abscess   FISTULA SUPERFICIALIZATION Left 09/14/2017   Procedure: FISTULA SUPERFICIALIZATION ARTERIOVENOUS FISTULA LEFT ARM;  Surgeon: [name], MD;  Location: [place];  Service: Vascular;  Laterality: Left;   FLEXIBLE SIGMOIDOSCOPY N/A 01/04/2019   Procedure: FLEXIBLE SIGMOIDOSCOPY;  Surgeon: Clarene Essex, MD;  Location: [place];  Service: Endoscopy;  Laterality: N/A;   HYDRADENITIS EXCISION  02/08/2011   Procedure: EXCISION HYDRADENITIS GROIN;  Surgeon: [name], MD;  Location: [place];  Service: General;  Laterality: N/A;  Excisioin of Hidradenitis Left groin   INCISION AND DRAINAGE ABSCESS N/A 01/11/2019   Procedure: INCISION AND DRAINAGE SACRAL ABSCESS;  Surgeon: Georganna Skeans, MD;  Location: [place];  Service: General;  Laterality: N/A;   INGUINAL HIDRADENITIS EXCISION  07/06/2010   bilateral   INSERTION OF DIALYSIS CATHETER N/A 09/14/2017   Procedure: INSERTION OF TUNNELED DIALYSIS CATHETER Right Internal Jugular;  Surgeon: [name], MD;  Location: [place];  Service: Vascular;  Laterality: N/A;   IR FLUORO GUIDE CV LINE RIGHT  03/01/2019   IR US GUIDE BX ASP/DRAIN  01/01/2019    IR US GUIDE VASC ACCESS RIGHT  03/01/2019   LEFT HEART CATH AND CORONARY ANGIOGRAPHY N/A 08/05/2016   Procedure: Left Heart Cath and Coronary Angiography;  Surgeon: [name], MD;  Location: [place] [facility];  Service: Cardiovascular;  Laterality: N/A;   PERIPHERAL VASCULAR INTERVENTION Left 12/14/2018   Procedure: PERIPHERAL VASCULAR INTERVENTION;  Surgeon: [name], MD;  Location: [place] [facility];  Service: Cardiovascular;  Laterality: Left;  SFA   PERIPHERAL VASCULAR INTERVENTION Left 04/25/2019   Procedure: PERIPHERAL VASCULAR INTERVENTION;  Surgeon: [name], MD;  Location: [place] [facility];  Service: Cardiovascular;  Laterality: Left;  right superficial femoral, and left iliac   PERIPHERAL VASCULAR INTERVENTION Left 07/18/2019   Procedure: PERIPHERAL VASCULAR INTERVENTION;  Surgeon: [name], MD;  Location: [place] [facility];  Service: Cardiovascular;  Laterality: Left;  external iliac   REDUCTION MAMMAPLASTY  2002   TONSILLECTOMY     TUBAL LIGATION  1996   VAGINAL HYSTERECTOMY  08/04/2005   and cysto    Prior to Admission medications   Medication Sig Start Date End Date Taking? Authorizing Provider  albuterol (PROVENTIL HFA;VENTOLIN HFA) 108 (90 BASE) MCG/ACT inhaler Inhale 1-2 puffs  into the lungs every 6 (six) hours as needed for wheezing or shortness of breath.     [provider]  amitriptyline (ELAVIL) 10 MG tablet Take 1 tablet (10 mg total) by mouth at bedtime. Patient not taking: No sig reported 09/19/18   Jamse Arn, MD  amLODipine (NORVASC) 10 MG tablet Take 1 tablet (10 mg total) by mouth daily. 08/06/19   [name], MD  aspirin 81 MG chewable tablet Chew 1 tablet (81 mg total) by mouth daily. 08/07/16   Cheryln Manly, NP  atorvastatin (LIPITOR) 40 MG tablet TAKE 1 TABLET BY MOUTH EVERY DAY Patient taking differently: Take 40 mg by mouth daily. 01/09/20   [name], MD  B Complex-C-Zn-Folic Acid  (DIALYVITE 800 WITH ZINC) 0.8 MG TABS Take 1 tablet by mouth daily. 10/06/18   [provider]  B Complex-C-Zn-Folic Acid (DIALYVITE/ZINC) TABS Take 1 tablet by mouth daily. 03/25/20   [provider]  calcitRIOL (ROCALTROL) 0.5 MCG capsule Take 1.5 mcg by mouth daily. 09/11/18   [provider]  calcium acetate (PHOSLO) 667 MG capsule Take 1,334-2,001 mg by mouth See admin instructions. Take 2001 mg by mouth up to three times daily with meals and 1334 mg with snacks 09/23/17   [provider]  clopidogrel (PLAVIX) 75 MG tablet Take 1 tablet (75 mg total) by mouth daily with breakfast. 08/06/19   [name], MD  doxercalciferol (HECTOROL) 4 MCG/2ML injection Doxercalciferol (Hectorol) 09/25/19 09/23/20  [provider]  fluconazole (DIFLUCAN) 150 MG tablet Take 150 mg by mouth See admin instructions. Take one tablet (150 mg) by mouth one day as directed, then take 2nd tablet (150 mg) 48 hours later 06/24/20   [provider]  Fluticasone-Salmeterol (ADVAIR) 250-50 MCG/DOSE AEPB Inhale 1 puff into the lungs 2 (two) times daily. 06/10/17   Charlott Rakes, MD  heparin 1000 unit/mL SOLN injection Heparin Sodium (Porcine) 1,000 Units/mL Systemic 08/14/19 08/12/20  [provider]  hydrALAZINE (APRESOLINE) 25 MG tablet Take 1 tablet (25 mg total) by mouth 3 (three) times daily. Patient not taking: No sig reported 08/06/19 11/04/19  [name], MD  hydrALAZINE (APRESOLINE) 50 MG tablet Take 50 mg by mouth every 8 (eight) hours. 06/18/20   [provider]  insulin aspart (NOVOLOG) 100 UNIT/ML injection 0-6 Units, Subcutaneous, 3 times daily with meals CBG < 70: Implement Hypoglycemia measures/call MD CBG 70 - 120: 0 units CBG 121 - 150: 0 units CBG 151 - 200: 1 unit CBG 201-250: 2 units CBG 251-300: 3 units CBG 301-350: 4 units CBG 351-400: 5 units CBG > 400: Give 6 units and call MD Patient not taking: No sig reported 03/02/19   Ghimire,  Henreitta Leber, MD  insulin glargine, 2 Unit Dial, (TOUJEO MAX SOLOSTAR) 300 UNIT/ML Solostar Pen Inject 54 Units into the skin at bedtime.  Patient not taking: No sig reported    [provider]  iron sucrose (VENOFER) 20 MG/ML injection Iron Sucrose (Venofer) 07/11/19   [provider]  isosorbide mononitrate (IMDUR) 30 MG 24 hr tablet Take 0.5 tablets (15 mg total) by mouth daily. 08/06/19   [name], MD  lidocaine-prilocaine (EMLA) cream Apply 1 application topically as needed. 02/25/20   McDonald, Stephan Minister, DPM  methocarbamol (ROBAXIN) 500 MG tablet Take 1 tablet (500 mg total) by mouth 2 (two) times daily as needed for muscle spasms. 09/07/17   Jamse Arn, MD  Methoxy PEG-Epoetin Beta (MIRCERA IJ) Inject  into the skin. 07/09/19   [provider]  metoCLOPramide (REGLAN) 5 MG tablet Take 1 tablet (5 mg total) by mouth every 8 (eight) hours as needed for nausea. 03/22/20   Jacqlyn Larsen, PA-C  metoprolol succinate (TOPROL-XL) 50 MG 24 hr tablet Take 75 mg (1.5 tablet) daily Patient taking differently: Take 75 mg by mouth daily. 07/03/19   [name], MD  montelukast (SINGULAIR) 10 MG tablet Take 1 tablet (10 mg total) by mouth at bedtime. 04/12/18   Kennith Gain, MD  nitroGLYCERIN (NITROSTAT) 0.4 MG SL tablet Place 1 tablet (0.4 mg total) under the tongue every 5 (five) minutes as needed for chest pain. 08/06/19   [name], MD  ondansetron (ZOFRAN ODT) 4 MG disintegrating tablet Take 1 tablet (4 mg total) by mouth every 8 (eight) hours as needed for nausea or vomiting. 03/02/19   Ghimire, Henreitta Leber, MD  pantoprazole (PROTONIX) 40 MG tablet Take 1 tablet (40 mg total) by mouth daily. Patient taking differently: Take 40 mg by mouth 2 (two) times daily. 03/22/20   Jacqlyn Larsen, PA-C  PRALUENT 75 MG/ML SOAJ INJECT 75 MG INTO THE SKIN EVERY 14 (FOURTEEN) DAYS. 02/07/20   [name], MD  sucralfate (CARAFATE) 1 GM/10ML suspension Take 10  mLs (1 g total) by mouth 4 (four) times daily -  with meals and at bedtime. 03/22/20   Jacqlyn Larsen, PA-C  trimethoprim-polymyxin b (POLYTRIM) ophthalmic solution Place 1 drop into both eyes See admin instructions. Instill one drop into both eyes four times daily for 2 days after each monthly eye injection 06/16/17   [provider]    Current Facility-Administered Medications  Medication Dose Route Frequency Provider Last Rate Last Admin   albuterol (VENTOLIN HFA) 108 (90 Base) MCG/ACT inhaler 1-2 puff  1-2 puff Inhalation Q6H PRN Wynetta Fines T, MD       aspirin chewable tablet 81 mg  81 mg Oral Daily Wynetta Fines T, MD   81 mg at 06/30/20 0854   cloNIDine (CATAPRES - Dosed in mg/24 hr) patch 0.1 mg  0.1 mg Transdermal Weekly Wynetta Fines T, MD   0.1 mg at 06/29/20 2303   clopidogrel (PLAVIX) tablet 75 mg  75 mg Oral Q breakfast Wynetta Fines T, MD   75 mg at 06/30/20 0854   fluticasone furoate-vilanterol (BREO ELLIPTA) 200-25 MCG/INH 1 puff  1 puff Inhalation Daily Wynetta Fines T, MD   1 puff at 06/30/20 0744   heparin injection 5,000 Units  5,000 Units Subcutaneous Q8H Wynetta Fines T, MD   5,000 Units at 06/29/20 2303   hydrALAZINE (APRESOLINE) tablet 25 mg  25 mg Oral Q8H Shawna Clamp, MD       insulin aspart (novoLOG) injection 0-6 Units  0-6 Units Subcutaneous TID WC Wynetta Fines T, MD       methocarbamol (ROBAXIN) tablet 500 mg  500 mg Oral BID PRN Wynetta Fines T, MD   500 mg at 06/29/20 2329   metoCLOPramide (REGLAN) injection 5 mg  5 mg Intravenous Q8H Wynetta Fines T, MD   5 mg at 06/30/20 0628   montelukast (SINGULAIR) tablet 10 mg  10 mg Oral QHS Wynetta Fines T, MD       pantoprazole (PROTONIX) injection 40 mg  40 mg Intravenous Q24H Wynetta Fines T, MD   40 mg at 06/29/20 2302   sucralfate (CARAFATE) 1 GM/10ML suspension 1 g  1 g Oral TID WC & HS Wynetta Fines T,  MD   1 g at 06/30/20 0854    Allergies as of 06/29/2020 - Review Complete 06/29/2020  Allergen Reaction Noted   Penicillins  Anaphylaxis, Itching, Swelling, Rash, and Other (See Comments) 06/09/2010   Repatha [evolocumab] Itching 04/12/2018   Lisinopril Cough 11/20/2015    Family History  Problem Relation Age of Onset   Allergic rhinitis Mother    Hypertension Father    Allergic rhinitis Father    Hypertension Sister    Allergic rhinitis Sister    Hypertension Brother    Allergic rhinitis Brother    Breast cancer Cousin        x2    Social History   Socioeconomic History   Marital status: Single    Spouse name: Not on file   Number of children: 3   Years of education: 14   Highest education level: Not on file  Occupational History   Occupation: Curator  Tobacco Use   Smoking status: Former    Years: 20.00    Pack years: 0.00    Types: Cigarettes    Quit date: 11/2016    Years since quitting: 3.6   Smokeless tobacco: Never  Vaping Use   Vaping Use: Never used  Substance and Sexual Activity   Alcohol use: No    Alcohol/week: 0.0 standard drinks   Drug use: Yes    Frequency: 2.0 times per week    Types: Marijuana   Sexual activity: Not on file  Other Topics Concern   Not on file  Social History Narrative   Lives with daughter in a 2 story home.  Has 3 children.  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  Education: college.    Social Determinants of Health   Financial Resource Strain: Not on file  Food Insecurity: Not on file  Transportation Needs: Not on file  Physical Activity: Not on file  Stress: Not on file  Social Connections: Not on file  Intimate Partner Violence: Not on file    Review of Systems: Pertinent positive and negative review of systems were noted in the above HPI section.  All other review of systems was otherwise negative.   Physical Exam: Vital signs in last 24 hours: Temp:  [98.1 F (36.7 C)-99.3 F (37.4 C)] 99.3 F (37.4 C) (06/20 0804) Pulse Rate:  [78-94] 87 (06/20 0804) Resp:  [10-27] 18 (06/20 0804) BP:  (161-206)/(54-98) 163/67 (06/20 0804) SpO2:  [91 %-100 %] 100 % (06/20 0804) Weight:  [68 kg] 68 kg (06/19 1308) Last BM Date: 06/29/20 General:   Alert,  Well-developed, well-nourished, African-American female ,pleasant and cooperative in NAD Head:  Normocephalic and atraumatic. Eyes:  Sclera clear, no icterus.   Conjunctiva pink. Ears:  Normal auditory acuity. Nose:  No deformity, discharge,  or lesions. Mouth:  No deformity or lesions.   Neck:  Supple; no masses or thyromegaly. Lungs: Few bibasilar crackles  heart:  Regular rate and rhythm; no murmurs, clicks, rubs,  or gallops. Abdomen:  Soft, nondistended, there is a mild tenderness across the upper abdomen no guarding or rebound BS active,nonpalp mass or hsm.   Rectal: Not done Msk:  Symmetrical without gross deformities. . Pulses:  Normal pulses noted. Extremities:  Without clubbing or edema. Neurologic:  Alert and  oriented x4;  grossly normal neurologically. Skin:  Intact without significant lesions or rashes.. Psych:  Alert and cooperative. Normal mood and affect.  Intake/Output from previous day: No intake/output data recorded. Intake/Output this shift: No  intake/output data recorded.  Lab Results: Recent Labs    06/29/20 1310  WBC 5.5  HGB 10.6*  HCT 36.2  PLT 212   BMET Recent Labs    06/29/20 1310  NA 138  K 3.7  CL 93*  CO2 31  GLUCOSE 97  BUN 26*  CREATININE 6.09*  CALCIUM 9.6   LFT Recent Labs    06/29/20 1310  PROT 7.1  ALBUMIN 3.3*  AST 21  ALT 24  ALKPHOS 101  BILITOT 1.0   PT/INR No results for input(s): LABPROT, INR in the last 72 hours. Hepatitis Panel No results for input(s): HEPBSAG, HCVAB, HEPAIGM, HEPBIGM in the last 72 hours.    IMPRESSION:  #42  50 year old African-American female with history of severe diabetic gastroparesis, followed at [facility], presenting with complaint of persistent nausea vomiting, and sensation of dysphagia with food sitting in the chest  "progressive over the past couple of weeks. Patient underwent very recent EGD on 06/12/2020 with Botox injections to the pylorus and then balloon dilation.  Patient was having significant symptoms prior to the procedure and it sounds like symptoms have progressed post procedure  Esophagus was normal at the time of EGD.  Query if the Botox has actually caused pyloric edema and subsequent partial gastric outlet obstruction as etiology for current symptoms.  #2 shortness of breath and cough-possible aspiration pneumonia, on Levaquin Also noted significantly elevated BNP raising question of congestive heart failure  #3 coronary artery disease status post prior stents-on Plavix #4 end-stage renal disease on hemodialysis #5 insulin-dependent diabetes mellitus 6.  Hypertension 7.  Status post cholecystectomy and appendectomy #8 chronic anemia #9 chronic constipation-resume MiraLAX and Colace once able to keep down p.o.'s  Plan; IV PPI twice daily Continue metoclopramide but increased from 5 mg to 10 mg every 8 hours IV Await results of barium swallow today May need repeat EGD to reassess Full liquids okay after barium swallow  GI will follow with you, then return to care of [facility]/Dr. Derrill Kay on discharge    Amy Esterwood PA-C 06/30/2020, 10:13 AM  Attending gastroenterologist  I am not sure what is going on here.  It is difficult for me to get a reading based upon her history.  Dysmotility noted on barium swallow.  Most of her problems seem to have started after her EGD with pyloric dilation and Botox injection 18 days ago.  This is in the background of severe diabetic gastroparesis which she is often accompanied by neuropathic pain.  I am going to have her undergo an EGD tomorrow.  We will leave her on her Plavix that we will change what I do.  I need to exclude some sort of complication from this Botox injection and pyloric balloon dilation though I doubt.  Trial of Flexeril 10 mg 3  times daily instead of as needed Robaxin for now.  Gatha Mayer, MD, [facility] 06/30/2020 5:18 PM

## 2020-06-30 NOTE — H&P (View-Only) (Signed)
Consultation  Referring Provider:  [facility]/ Dwyane Dee Primary Care Physician:  Charlott Rakes, MD Primary Gastroenterologist:  Dr.Koch / [facility]  Reason for Consultation: Intractable nausea and vomiting and SOB  HPI: Susan Horton is a 49 y.o. female, who was admitted yesterday evening after presenting to the emergency room with complaints of sensation of food stuck in her chest. Patient has history of chronic diabetic gastroparesis, followed at [facility]/Dr. Derrill Kay.  Patient also with end-stage renal disease, on hemodialysis, insulin-dependent diabetes mellitus, hypertension, coronary artery disease status post prior stents, she is status post cholecystectomy and appendectomy. She underwent upper endoscopy at [facility] on 06/12/2020 with Botox injections to the pylorus and then balloon dilation of the pylorus for treatment of her chronic gastroparesis. Patient presented to the emergency room last night with complaints of progressive dysphagia, nausea and vomiting, over the past couple of weeks.  She is also complaining of progressive shortness of breath over the past month, and says she had a difficult time lying flat to sleep.  She has been coughing a lot. She does not think that she had any improvement in her symptoms at all after this last endoscopy with Botox and dilation.  She has a difficult time describing her symptoms but says that everything she eats or drinks seems to be "sitting in my chest".  She is also complaining of fullness and a knot sensation in her upper abdomen and does not feel that her stomach is emptying.  She will vomit up foamy mucoid material.  No significant heartburn or indigestion type symptoms.  She is a bit unclear about her medications but believes that she takes Reglan 3 times daily before her meals.  She is also having significant difficulty with constipation despite being on MiraLAX, stool softeners and another liquid laxative that she cannot recall the name of it  is not on her med list.  Initial labs WBC 5.5/hemoglobin 10.6/hematocrit 36.2, LFTs within normal limits,  BNP 4000 COVID screen negative Chest x-ray question left lower lobe pneumonia CT of the abdomen pelvis shows a small right effusion, normal-appearing stomach, there is some mesenteric and retroperitoneal fat stranding and a small amount of free fluid in the retroperitoneum mostly right-sided.  Barium swallow is pending this morning.     Past Medical History:  Diagnosis Date   Abnormal stress test    Anemia    Angio-edema    Asthma    CAD (coronary artery disease)    DES to mid LAD July 2018, residual moderate RCA disease   Cataract    CKD (chronic kidney disease) stage 4, GFR 15-29 ml/min (HCC)    Dialysis T/Th/Sa   DVT (deep venous thrombosis) ([place])    1996 during pregnancy, 2015 left leg   Eczema    Essential hypertension 12/11/2015   Gastroparesis    GERD (gastroesophageal reflux disease)    Gluteal abscess 12/27/2018   Hidradenitis    Migraine    Neuropathy    Peripheral vascular disease (HCC)    blood clot in leg   Progressive angina ([place]) 06/05/2014   Chest pain   Type 2 diabetes mellitus ([place])    Type II diabetes mellitus ([place])    Urticaria     Past Surgical History:  Procedure Laterality Date   A/V FISTULAGRAM N/A 06/22/2017   Procedure: A/V FISTULAGRAM - left arm;  Surgeon: [name], MD;  Location: [place] [facility];  Service: Cardiovascular;  Laterality: N/A;   ABDOMINAL AORTOGRAM W/LOWER  EXTREMITY Bilateral 12/14/2018   Procedure: ABDOMINAL AORTOGRAM W/LOWER EXTREMITY;  Surgeon: [name], MD;  Location: [place] [facility];  Service: Cardiovascular;  Laterality: Bilateral;   ABDOMINAL AORTOGRAM W/LOWER EXTREMITY N/A 04/25/2019   Procedure: ABDOMINAL AORTOGRAM W/LOWER EXTREMITY;  Surgeon: [name], MD;  Location: [place] [facility];  Service: Cardiovascular;  Laterality: N/A;   ABDOMINAL AORTOGRAM W/LOWER EXTREMITY  Left 07/18/2019   Procedure: ABDOMINAL AORTOGRAM W/LOWER EXTREMITY;  Surgeon: [name], MD;  Location: [place] [facility];  Service: Cardiovascular;  Laterality: Left;   ABDOMINOPLASTY     ADENOIDECTOMY     APPENDECTOMY  1995   AV FISTULA PLACEMENT Left 02/01/2017   Procedure: ARTERIOVENOUS BRACHIOCEPHALIC (AV) FISTULA CREATION;  Surgeon: [name], MD;  Location: [place] [facility];  Service: Vascular;  Laterality: Left;   [place] INTERVENTION N/A 08/05/2016   Procedure: Coronary Stent Intervention;  Surgeon: [name], MD;  Location: [place] [facility];  Service: Cardiovascular;  Laterality: N/A;   EXPLORATORY LAPAROTOMY  08/14/2005   lysis of adhesions, drainage of tubo-ovarian abscess   FISTULA SUPERFICIALIZATION Left 09/14/2017   Procedure: FISTULA SUPERFICIALIZATION ARTERIOVENOUS FISTULA LEFT ARM;  Surgeon: [name], MD;  Location: [place];  Service: Vascular;  Laterality: Left;   FLEXIBLE SIGMOIDOSCOPY N/A 01/04/2019   Procedure: FLEXIBLE SIGMOIDOSCOPY;  Surgeon: Clarene Essex, MD;  Location: [place];  Service: Endoscopy;  Laterality: N/A;   HYDRADENITIS EXCISION  02/08/2011   Procedure: EXCISION HYDRADENITIS GROIN;  Surgeon: [name], MD;  Location: [place];  Service: General;  Laterality: N/A;  Excisioin of Hidradenitis Left groin   INCISION AND DRAINAGE ABSCESS N/A 01/11/2019   Procedure: INCISION AND DRAINAGE SACRAL ABSCESS;  Surgeon: Georganna Skeans, MD;  Location: [place];  Service: General;  Laterality: N/A;   INGUINAL HIDRADENITIS EXCISION  07/06/2010   bilateral   INSERTION OF DIALYSIS CATHETER N/A 09/14/2017   Procedure: INSERTION OF TUNNELED DIALYSIS CATHETER Right Internal Jugular;  Surgeon: [name], MD;  Location: [place];  Service: Vascular;  Laterality: N/A;   IR FLUORO GUIDE CV LINE RIGHT  03/01/2019   IR US GUIDE BX ASP/DRAIN  01/01/2019    IR US GUIDE VASC ACCESS RIGHT  03/01/2019   LEFT HEART CATH AND CORONARY ANGIOGRAPHY N/A 08/05/2016   Procedure: Left Heart Cath and Coronary Angiography;  Surgeon: [name], MD;  Location: [place] [facility];  Service: Cardiovascular;  Laterality: N/A;   PERIPHERAL VASCULAR INTERVENTION Left 12/14/2018   Procedure: PERIPHERAL VASCULAR INTERVENTION;  Surgeon: [name], MD;  Location: [place] [facility];  Service: Cardiovascular;  Laterality: Left;  SFA   PERIPHERAL VASCULAR INTERVENTION Left 04/25/2019   Procedure: PERIPHERAL VASCULAR INTERVENTION;  Surgeon: [name], MD;  Location: [place] [facility];  Service: Cardiovascular;  Laterality: Left;  right superficial femoral, and left iliac   PERIPHERAL VASCULAR INTERVENTION Left 07/18/2019   Procedure: PERIPHERAL VASCULAR INTERVENTION;  Surgeon: [name], MD;  Location: [place] [facility];  Service: Cardiovascular;  Laterality: Left;  external iliac   REDUCTION MAMMAPLASTY  2002   TONSILLECTOMY     TUBAL LIGATION  1996   VAGINAL HYSTERECTOMY  08/04/2005   and cysto    Prior to Admission medications   Medication Sig Start Date End Date Taking? Authorizing Provider  albuterol (PROVENTIL HFA;VENTOLIN HFA) 108 (90 BASE) MCG/ACT inhaler Inhale 1-2 puffs  into the lungs every 6 (six) hours as needed for wheezing or shortness of breath.     [provider]  amitriptyline (ELAVIL) 10 MG tablet Take 1 tablet (10 mg total) by mouth at bedtime. Patient not taking: No sig reported 09/19/18   Jamse Arn, MD  amLODipine (NORVASC) 10 MG tablet Take 1 tablet (10 mg total) by mouth daily. 08/06/19   [name], MD  aspirin 81 MG chewable tablet Chew 1 tablet (81 mg total) by mouth daily. 08/07/16   Cheryln Manly, NP  atorvastatin (LIPITOR) 40 MG tablet TAKE 1 TABLET BY MOUTH EVERY DAY Patient taking differently: Take 40 mg by mouth daily. 01/09/20   [name], MD  B Complex-C-Zn-Folic Acid  (DIALYVITE 800 WITH ZINC) 0.8 MG TABS Take 1 tablet by mouth daily. 10/06/18   [provider]  B Complex-C-Zn-Folic Acid (DIALYVITE/ZINC) TABS Take 1 tablet by mouth daily. 03/25/20   [provider]  calcitRIOL (ROCALTROL) 0.5 MCG capsule Take 1.5 mcg by mouth daily. 09/11/18   [provider]  calcium acetate (PHOSLO) 667 MG capsule Take 1,334-2,001 mg by mouth See admin instructions. Take 2001 mg by mouth up to three times daily with meals and 1334 mg with snacks 09/23/17   [provider]  clopidogrel (PLAVIX) 75 MG tablet Take 1 tablet (75 mg total) by mouth daily with breakfast. 08/06/19   [name], MD  doxercalciferol (HECTOROL) 4 MCG/2ML injection Doxercalciferol (Hectorol) 09/25/19 09/23/20  [provider]  fluconazole (DIFLUCAN) 150 MG tablet Take 150 mg by mouth See admin instructions. Take one tablet (150 mg) by mouth one day as directed, then take 2nd tablet (150 mg) 48 hours later 06/24/20   [provider]  Fluticasone-Salmeterol (ADVAIR) 250-50 MCG/DOSE AEPB Inhale 1 puff into the lungs 2 (two) times daily. 06/10/17   Charlott Rakes, MD  heparin 1000 unit/mL SOLN injection Heparin Sodium (Porcine) 1,000 Units/mL Systemic 08/14/19 08/12/20  [provider]  hydrALAZINE (APRESOLINE) 25 MG tablet Take 1 tablet (25 mg total) by mouth 3 (three) times daily. Patient not taking: No sig reported 08/06/19 11/04/19  [name], MD  hydrALAZINE (APRESOLINE) 50 MG tablet Take 50 mg by mouth every 8 (eight) hours. 06/18/20   [provider]  insulin aspart (NOVOLOG) 100 UNIT/ML injection 0-6 Units, Subcutaneous, 3 times daily with meals CBG < 70: Implement Hypoglycemia measures/call MD CBG 70 - 120: 0 units CBG 121 - 150: 0 units CBG 151 - 200: 1 unit CBG 201-250: 2 units CBG 251-300: 3 units CBG 301-350: 4 units CBG 351-400: 5 units CBG > 400: Give 6 units and call MD Patient not taking: No sig reported 03/02/19   Ghimire,  Henreitta Leber, MD  insulin glargine, 2 Unit Dial, (TOUJEO MAX SOLOSTAR) 300 UNIT/ML Solostar Pen Inject 54 Units into the skin at bedtime.  Patient not taking: No sig reported    [provider]  iron sucrose (VENOFER) 20 MG/ML injection Iron Sucrose (Venofer) 07/11/19   [provider]  isosorbide mononitrate (IMDUR) 30 MG 24 hr tablet Take 0.5 tablets (15 mg total) by mouth daily. 08/06/19   [name], MD  lidocaine-prilocaine (EMLA) cream Apply 1 application topically as needed. 02/25/20   McDonald, Stephan Minister, DPM  methocarbamol (ROBAXIN) 500 MG tablet Take 1 tablet (500 mg total) by mouth 2 (two) times daily as needed for muscle spasms. 09/07/17   Jamse Arn, MD  Methoxy PEG-Epoetin Beta (MIRCERA IJ) Inject  into the skin. 07/09/19   [provider]  metoCLOPramide (REGLAN) 5 MG tablet Take 1 tablet (5 mg total) by mouth every 8 (eight) hours as needed for nausea. 03/22/20   Jacqlyn Larsen, PA-C  metoprolol succinate (TOPROL-XL) 50 MG 24 hr tablet Take 75 mg (1.5 tablet) daily Patient taking differently: Take 75 mg by mouth daily. 07/03/19   [name], MD  montelukast (SINGULAIR) 10 MG tablet Take 1 tablet (10 mg total) by mouth at bedtime. 04/12/18   Kennith Gain, MD  nitroGLYCERIN (NITROSTAT) 0.4 MG SL tablet Place 1 tablet (0.4 mg total) under the tongue every 5 (five) minutes as needed for chest pain. 08/06/19   [name], MD  ondansetron (ZOFRAN ODT) 4 MG disintegrating tablet Take 1 tablet (4 mg total) by mouth every 8 (eight) hours as needed for nausea or vomiting. 03/02/19   Ghimire, Henreitta Leber, MD  pantoprazole (PROTONIX) 40 MG tablet Take 1 tablet (40 mg total) by mouth daily. Patient taking differently: Take 40 mg by mouth 2 (two) times daily. 03/22/20   Jacqlyn Larsen, PA-C  PRALUENT 75 MG/ML SOAJ INJECT 75 MG INTO THE SKIN EVERY 14 (FOURTEEN) DAYS. 02/07/20   [name], MD  sucralfate (CARAFATE) 1 GM/10ML suspension Take 10  mLs (1 g total) by mouth 4 (four) times daily -  with meals and at bedtime. 03/22/20   Jacqlyn Larsen, PA-C  trimethoprim-polymyxin b (POLYTRIM) ophthalmic solution Place 1 drop into both eyes See admin instructions. Instill one drop into both eyes four times daily for 2 days after each monthly eye injection 06/16/17   [provider]    Current Facility-Administered Medications  Medication Dose Route Frequency Provider Last Rate Last Admin   albuterol (VENTOLIN HFA) 108 (90 Base) MCG/ACT inhaler 1-2 puff  1-2 puff Inhalation Q6H PRN Wynetta Fines T, MD       aspirin chewable tablet 81 mg  81 mg Oral Daily Wynetta Fines T, MD   81 mg at 06/30/20 0854   cloNIDine (CATAPRES - Dosed in mg/24 hr) patch 0.1 mg  0.1 mg Transdermal Weekly Wynetta Fines T, MD   0.1 mg at 06/29/20 2303   clopidogrel (PLAVIX) tablet 75 mg  75 mg Oral Q breakfast Wynetta Fines T, MD   75 mg at 06/30/20 0854   fluticasone furoate-vilanterol (BREO ELLIPTA) 200-25 MCG/INH 1 puff  1 puff Inhalation Daily Wynetta Fines T, MD   1 puff at 06/30/20 0744   heparin injection 5,000 Units  5,000 Units Subcutaneous Q8H Wynetta Fines T, MD   5,000 Units at 06/29/20 2303   hydrALAZINE (APRESOLINE) tablet 25 mg  25 mg Oral Q8H Shawna Clamp, MD       insulin aspart (novoLOG) injection 0-6 Units  0-6 Units Subcutaneous TID WC Wynetta Fines T, MD       methocarbamol (ROBAXIN) tablet 500 mg  500 mg Oral BID PRN Wynetta Fines T, MD   500 mg at 06/29/20 2329   metoCLOPramide (REGLAN) injection 5 mg  5 mg Intravenous Q8H Wynetta Fines T, MD   5 mg at 06/30/20 0628   montelukast (SINGULAIR) tablet 10 mg  10 mg Oral QHS Wynetta Fines T, MD       pantoprazole (PROTONIX) injection 40 mg  40 mg Intravenous Q24H Wynetta Fines T, MD   40 mg at 06/29/20 2302   sucralfate (CARAFATE) 1 GM/10ML suspension 1 g  1 g Oral TID WC & HS Wynetta Fines T,  MD   1 g at 06/30/20 0854    Allergies as of 06/29/2020 - Review Complete 06/29/2020  Allergen Reaction Noted   Penicillins  Anaphylaxis, Itching, Swelling, Rash, and Other (See Comments) 06/09/2010   Repatha [evolocumab] Itching 04/12/2018   Lisinopril Cough 11/20/2015    Family History  Problem Relation Age of Onset   Allergic rhinitis Mother    Hypertension Father    Allergic rhinitis Father    Hypertension Sister    Allergic rhinitis Sister    Hypertension Brother    Allergic rhinitis Brother    Breast cancer Cousin        x2    Social History   Socioeconomic History   Marital status: Single    Spouse name: Not on file   Number of children: 3   Years of education: 14   Highest education level: Not on file  Occupational History   Occupation: Curator  Tobacco Use   Smoking status: Former    Years: 20.00    Pack years: 0.00    Types: Cigarettes    Quit date: 11/2016    Years since quitting: 3.6   Smokeless tobacco: Never  Vaping Use   Vaping Use: Never used  Substance and Sexual Activity   Alcohol use: No    Alcohol/week: 0.0 standard drinks   Drug use: Yes    Frequency: 2.0 times per week    Types: Marijuana   Sexual activity: Not on file  Other Topics Concern   Not on file  Social History Narrative   Lives with daughter in a 2 story home.  Has 3 children.  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  Education: college.    Social Determinants of Health   Financial Resource Strain: Not on file  Food Insecurity: Not on file  Transportation Needs: Not on file  Physical Activity: Not on file  Stress: Not on file  Social Connections: Not on file  Intimate Partner Violence: Not on file    Review of Systems: Pertinent positive and negative review of systems were noted in the above HPI section.  All other review of systems was otherwise negative.   Physical Exam: Vital signs in last 24 hours: Temp:  [98.1 F (36.7 C)-99.3 F (37.4 C)] 99.3 F (37.4 C) (06/20 0804) Pulse Rate:  [78-94] 87 (06/20 0804) Resp:  [10-27] 18 (06/20 0804) BP:  (161-206)/(54-98) 163/67 (06/20 0804) SpO2:  [91 %-100 %] 100 % (06/20 0804) Weight:  [68 kg] 68 kg (06/19 1308) Last BM Date: 06/29/20 General:   Alert,  Well-developed, well-nourished, African-American female ,pleasant and cooperative in NAD Head:  Normocephalic and atraumatic. Eyes:  Sclera clear, no icterus.   Conjunctiva pink. Ears:  Normal auditory acuity. Nose:  No deformity, discharge,  or lesions. Mouth:  No deformity or lesions.   Neck:  Supple; no masses or thyromegaly. Lungs: Few bibasilar crackles  heart:  Regular rate and rhythm; no murmurs, clicks, rubs,  or gallops. Abdomen:  Soft, nondistended, there is a mild tenderness across the upper abdomen no guarding or rebound BS active,nonpalp mass or hsm.   Rectal: Not done Msk:  Symmetrical without gross deformities. . Pulses:  Normal pulses noted. Extremities:  Without clubbing or edema. Neurologic:  Alert and  oriented x4;  grossly normal neurologically. Skin:  Intact without significant lesions or rashes.. Psych:  Alert and cooperative. Normal mood and affect.  Intake/Output from previous day: No intake/output data recorded. Intake/Output this shift: No  intake/output data recorded.  Lab Results: Recent Labs    06/29/20 1310  WBC 5.5  HGB 10.6*  HCT 36.2  PLT 212   BMET Recent Labs    06/29/20 1310  NA 138  K 3.7  CL 93*  CO2 31  GLUCOSE 97  BUN 26*  CREATININE 6.09*  CALCIUM 9.6   LFT Recent Labs    06/29/20 1310  PROT 7.1  ALBUMIN 3.3*  AST 21  ALT 24  ALKPHOS 101  BILITOT 1.0   PT/INR No results for input(s): LABPROT, INR in the last 72 hours. Hepatitis Panel No results for input(s): HEPBSAG, HCVAB, HEPAIGM, HEPBIGM in the last 72 hours.    IMPRESSION:  #40  50 year old African-American female with history of severe diabetic gastroparesis, followed at [facility], presenting with complaint of persistent nausea vomiting, and sensation of dysphagia with food sitting in the chest  "progressive over the past couple of weeks. Patient underwent very recent EGD on 06/12/2020 with Botox injections to the pylorus and then balloon dilation.  Patient was having significant symptoms prior to the procedure and it sounds like symptoms have progressed post procedure  Esophagus was normal at the time of EGD.  Query if the Botox has actually caused pyloric edema and subsequent partial gastric outlet obstruction as etiology for current symptoms.  #2 shortness of breath and cough-possible aspiration pneumonia, on Levaquin Also noted significantly elevated BNP raising question of congestive heart failure  #3 coronary artery disease status post prior stents-on Plavix #4 end-stage renal disease on hemodialysis #5 insulin-dependent diabetes mellitus 6.  Hypertension 7.  Status post cholecystectomy and appendectomy #8 chronic anemia #9 chronic constipation-resume MiraLAX and Colace once able to keep down p.o.'s  Plan; IV PPI twice daily Continue metoclopramide but increased from 5 mg to 10 mg every 8 hours IV Await results of barium swallow today May need repeat EGD to reassess Full liquids okay after barium swallow  GI will follow with you, then return to care of [facility]/Dr. Derrill Kay on discharge    Amy Esterwood PA-C 06/30/2020, 10:13 AM  Attending gastroenterologist  I am not sure what is going on here.  It is difficult for me to get a reading based upon her history.  Dysmotility noted on barium swallow.  Most of her problems seem to have started after her EGD with pyloric dilation and Botox injection 18 days ago.  This is in the background of severe diabetic gastroparesis which she is often accompanied by neuropathic pain.  I am going to have her undergo an EGD tomorrow.  We will leave her on her Plavix that we will change what I do.  I need to exclude some sort of complication from this Botox injection and pyloric balloon dilation though I doubt.  Trial of Flexeril 10 mg 3  times daily instead of as needed Robaxin for now.  Gatha Mayer, MD, [facility] 06/30/2020 5:18 PM

## 2020-06-30 NOTE — Progress Notes (Signed)
PROGRESS NOTE    MEILY GLOWACKI  PJK:[id number] DOB: August 12, 1970 DOA: 06/29/2020 PCP: Charlott Rakes, MD   Brief Narrative: This 50 years old female with PMH significant for diabetic gastroparesis,  s/p Botox injection, insulin-dependent diabetes mellitus, end-stage renal disease on hemodialysis TTS, hypertension, hyperlipidemia, CAD with stents presented in the ED with intractable nausea and vomiting. Patient had chronic gastroparesis with frequent flare ups,  she has been on Reglan and as needed Zofran.  She recently underwent EGD and Botox injection on the pylorus on June 2.  Since the procedure patient is experiencing worsening of dysphagia. CT abdomen shows nonspecific fatty stranding in the retroperitoneal area.  General surgery was consulted,  recommended symptomatic management.  GI recommended barium swallow, patient may need repeat EGD to reassess.  Assessment & Plan:   Active Problems:   Intractable nausea and vomiting   Intractable nausea with vomiting and dysphagia: Patient reports intractable nausea and vomiting and difficulty swallowing post Botox injection. She reports worsening gastroparesis symptoms.   Possible patient may have developed pylorus edema post injection, and subsequent partial gastric outlet obstruction Change p.o. Reglan and Zofran to IV form. Continue PPI. Obtain barium swallow study. GI consulted, increased Reglan to 10 mg every 8 hours.  Patient may need repeat EGD to reassess.  Aspiration PNA ? Probably related to repeated nauseous vomit, received 1 dose of Levaquin in ED, re-dose after 48 hours.   Essential hypertension: Elevated. Likely secondary to noncompliance with BP meds due to swallowing problems.   Continue IV hydralazine every 8 hours, and clonidine patch until patient can swallow.   ESRD on HD Euvolemic, , had hemo dialysis yesterday,   next session Tuesday.   CAD Stable   Diabetic gastroparesis Continue Reglan 10 mg every 8 hours.    IDDM Continue Sliding scale   DVT prophylaxis: Heparin sq Code Status: Full code. Family Communication: No family at bed side. Disposition Plan:   Status is: Inpatient  Remains inpatient appropriate because:Inpatient level of care appropriate due to severity of illness  Dispo: The patient is from: Home              Anticipated d/c is to: Home              Patient currently is not medically stable to d/c.   Difficult to place patient No  Consultants:  GI Surgery  Procedures:  Antimicrobials:   Anti-infectives (From admission, onward)    Start     Dose/Rate Route Frequency Ordered Stop   06/29/20 1845  levofloxacin (LEVAQUIN) IVPB 750 mg        750 mg 100 mL/hr over 90 Minutes Intravenous  Once 06/29/20 1831 06/29/20 2303       Subjective: Patient was seen and examined at bedside.  Overnight events noted.   She still reports persistent nausea and vomiting and also reports abdominal pain after she eats meals.  Objective: Vitals:   06/29/20 2147 06/30/20 0401 06/30/20 0745 06/30/20 0804  BP: (!) 192/74 (!) 161/71  (!) 163/67  Pulse: 94   87  Resp: 18   18  Temp: 98.1 F (36.7 C)   99.3 F (37.4 C)  TempSrc: Oral     SpO2: 96%  97% 100%  Weight:      Height:       No intake or output data in the 24 hours ending 06/30/20 1324 Filed Weights   06/29/20 1308  Weight: 68 kg    Examination:  General exam: Appears calm  and comfortable, not in any acute distress. Respiratory system: Clear to auscultation. Respiratory effort normal. Cardiovascular system: S1 & S2 heard, RRR. No JVD, murmurs, rubs, gallops or clicks. No pedal edema. Gastrointestinal system: Abdomen is nondistended, soft and tender ness ++. No organomegaly or masses felt. Normal bowel sounds heard. Central nervous system: Alert and oriented. No focal neurological deficits. Extremities: Symmetric 5 x 5 power.  No edema, no cyanosis, no clubbing. Skin: No rashes, lesions or ulcers Psychiatry:  Judgement and insight appear normal. Mood & affect appropriate.     Data Reviewed: I have personally reviewed following labs and imaging studies  CBC: Recent Labs  Lab 06/29/20 1310  WBC 5.5  NEUTROABS 3.9  HGB 10.6*  HCT 36.2  MCV 81.9  PLT 665   Basic Metabolic Panel: Recent Labs  Lab 06/29/20 1310  NA 138  K 3.7  CL 93*  CO2 31  GLUCOSE 97  BUN 26*  CREATININE 6.09*  CALCIUM 9.6   GFR: Estimated Creatinine Clearance: 10.1 mL/min (A) (by C-G formula based on SCr of 6.09 mg/dL (H)). Liver Function Tests: Recent Labs  Lab 06/29/20 1310  AST 21  ALT 24  ALKPHOS 101  BILITOT 1.0  PROT 7.1  ALBUMIN 3.3*   Recent Labs  Lab 06/29/20 1415  LIPASE 25   No results for input(s): AMMONIA in the last 168 hours. Coagulation Profile: No results for input(s): INR, PROTIME in the last 168 hours. Cardiac Enzymes: No results for input(s): CKTOTAL, CKMB, CKMBINDEX, TROPONINI in the last 168 hours. BNP (last 3 results) No results for input(s): PROBNP in the last 8760 hours. HbA1C: No results for input(s): HGBA1C in the last 72 hours. CBG: Recent Labs  Lab 06/29/20 2157  GLUCAP 106*   Lipid Profile: No results for input(s): CHOL, HDL, LDLCALC, TRIG, CHOLHDL, LDLDIRECT in the last 72 hours. Thyroid Function Tests: No results for input(s): TSH, T4TOTAL, FREET4, T3FREE, THYROIDAB in the last 72 hours. Anemia Panel: No results for input(s): VITAMINB12, FOLATE, FERRITIN, TIBC, IRON, RETICCTPCT in the last 72 hours. Sepsis Labs: No results for input(s): PROCALCITON, LATICACIDVEN in the last 168 hours.  Recent Results (from the past 240 hour(s))  Resp Panel by RT-PCR (Flu A&B, Covid) Nasopharyngeal Swab     Status: None   Collection Time: 06/29/20  4:38 PM   Specimen: Nasopharyngeal Swab; Nasopharyngeal(NP) swabs in vial transport medium  Result Value Ref Range Status   SARS Coronavirus 2 by RT PCR NEGATIVE NEGATIVE Final    Comment: (NOTE) SARS-CoV-2 target  nucleic acids are NOT DETECTED.  The SARS-CoV-2 RNA is generally detectable in upper respiratory specimens during the acute phase of infection. The lowest concentration of SARS-CoV-2 viral copies this assay can detect is 138 copies/mL. A negative result does not preclude SARS-Cov-2 infection and should not be used as the sole basis for treatment or other patient management decisions. A negative result may occur with  improper specimen collection/handling, submission of specimen other than nasopharyngeal swab, presence of viral mutation(s) within the areas targeted by this assay, and inadequate number of viral copies(<138 copies/mL). A negative result must be combined with clinical observations, patient history, and epidemiological information. The expected result is Negative.  Fact Sheet for Patients:  EntrepreneurPulse.com.au  Fact Sheet for Healthcare Providers:  IncredibleEmployment.be  This test is no t yet approved or cleared by the [place] FDA and  has been authorized for detection and/or diagnosis of SARS-CoV-2 by FDA under an Emergency Use Authorization (EUA). This EUA will remain  in effect (meaning this test can be used) for the duration of the COVID-19 declaration under Section 564(b)(1) of the Act, 21 U.S.C.section 360bbb-3(b)(1), unless the authorization is terminated  or revoked sooner.       Influenza A by PCR NEGATIVE NEGATIVE Final   Influenza B by PCR NEGATIVE NEGATIVE Final    Comment: (NOTE) The Xpert Xpress SARS-CoV-2/FLU/RSV plus assay is intended as an aid in the diagnosis of influenza from Nasopharyngeal swab specimens and should not be used as a sole basis for treatment. Nasal washings and aspirates are unacceptable for Xpert Xpress SARS-CoV-2/FLU/RSV testing.  Fact Sheet for Patients: EntrepreneurPulse.com.au  Fact Sheet for Healthcare  Providers: IncredibleEmployment.be  This test is not yet approved or cleared by the [place] FDA and has been authorized for detection and/or diagnosis of SARS-CoV-2 by FDA under an Emergency Use Authorization (EUA). This EUA will remain in effect (meaning this test can be used) for the duration of the COVID-19 declaration under Section 564(b)(1) of the Act, 21 U.S.C. section 360bbb-3(b)(1), unless the authorization is terminated or revoked.  Performed at [facility], [place] [address] [place], [place] [address]     Radiology Studies: CT Abdomen Pelvis Wo Contrast  Result Date: 06/29/2020 CLINICAL DATA:  Suspected bowel obstruction. EXAM: CT ABDOMEN AND PELVIS WITHOUT CONTRAST TECHNIQUE: Multidetector CT imaging of the abdomen and pelvis was performed following the standard protocol without IV contrast. COMPARISON:  February 24, 2019 FINDINGS: Lower chest: Small right pleural effusion. Calcific atherosclerotic disease of the aorta and coronary arteries. Mild peripheral ground-glass opacities in the lung bases. Hepatobiliary: No focal liver abnormality is seen. Status post cholecystectomy. No biliary dilatation. Pancreas: Unremarkable. No pancreatic ductal dilatation or surrounding inflammatory changes. Spleen: Normal in size without focal abnormality. Adrenals/Urinary Tract: Adrenal glands are unremarkable. Kidneys are normal, without renal calculi, focal lesion, or hydronephrosis. Bladder is unremarkable. Stomach/Bowel: Stomach is within normal limits. Appendix appears normal. No evidence of bowel wall thickening, distention, or inflammatory changes. Vascular/Lymphatic: Aortic atherosclerosis. No enlarged abdominal or pelvic lymph nodes. Reproductive: Status post hysterectomy. No adnexal masses. Other: Mesenteric and retroperitoneal fat stranding and small amount of free retroperitoneal fluid, mostly right-sided. Musculoskeletal: No acute or significant osseous  findings. Coarse calcifications adjacent to the left greater trochanter, likely dystrophic. Soft tissue thickening and small amount of gas likely represent right sacral wound. No evidence of involvement of the underlying sacrum. IMPRESSION: 1. No evidence of small-bowel obstruction. 2. Mesenteric and retroperitoneal fat stranding and small amount of free retroperitoneal fluid, mostly right-sided. 3. Small right pleural effusion. 4. Mild peripheral ground-glass opacities in the lung bases, which may represent mild pulmonary edema or atypical consolidation. 5. Calcific atherosclerotic disease of the aorta and coronary arteries. 6. Right sacral wound. Aortic Atherosclerosis (ICD10-I70.0). Electronically Signed   By: Fidela Salisbury M.D.   On: 06/29/2020 15:27   DG Chest 2 View  Result Date: 06/29/2020 CLINICAL DATA:  Shortness of breath, chest pain. EXAM: CHEST - 2 VIEW COMPARISON:  Prior chest radiographs 03/22/2020 and earlier. FINDINGS: Heart size at the upper limits of normal. Aortic atherosclerosis. Ill-defined opacity within the left lung base. Unchanged nonspecific prominence of the minor fissure. No evidence of pleural effusion or pneumothorax. No acute bony abnormality identified. Surgical clips within the right upper quadrant of the abdomen. IMPRESSION: Ill-defined opacity within the left lung base, suspicious for pneumonia given the provided history. Followup PA and lateral chest radiographs are recommended in 3-4 weeks following a trial of antibiotic therapy to ensure  resolution and exclude alternative etiologies (i.e. underlying malignancy). Aortic Atherosclerosis (ICD10-I70.0). Electronically Signed   By: Kellie Simmering DO   On: 06/29/2020 13:40     Scheduled Meds:  aspirin  81 mg Oral Daily   cloNIDine  0.1 mg Transdermal Weekly   clopidogrel  75 mg Oral Q breakfast   fluticasone furoate-vilanterol  1 puff Inhalation Daily   heparin  5,000 Units Subcutaneous Q8H   hydrALAZINE  25 mg Oral  Q8H   insulin aspart  0-6 Units Subcutaneous TID WC   metoCLOPramide (REGLAN) injection  10 mg Intravenous Q8H   montelukast  10 mg Oral QHS   pantoprazole (PROTONIX) IV  40 mg Intravenous Q24H   sucralfate  1 g Oral TID WC & HS   Continuous Infusions:   LOS: 1 day    Time spent: 35 mins    Piera Downs, MD [facility]   If 7PM-7AM, please contact night-coverage

## 2020-06-30 NOTE — Progress Notes (Signed)
Patient refused blood sugar check.Patient stated," I have not checked my blood sugar in over a year now."

## 2020-06-30 NOTE — Progress Notes (Signed)
SLP Cancellation Note  Patient Details Name: Susan Horton MRN: [id number] DOB: March 25, 1970   Cancelled treatment:   Orders for MBS received; pt also has orders for esophagram today.  She is being followed by GI, has recent hx of recent Botox injections to pylorus and balloon dilation.  GI is the appropriate practice to be managing her esophageal issues. SLP will sign off.    Juan Quam Laurice 06/30/2020, 1:27 PM Park Pope. Tivis Ringer, [place] Office number [phone] Pager [phone]

## 2020-07-01 ENCOUNTER — Inpatient Hospital Stay (HOSPITAL_COMMUNITY): Payer: Medicare Other | Admitting: Anesthesiology

## 2020-07-01 ENCOUNTER — Encounter (HOSPITAL_COMMUNITY): Payer: Self-pay | Admitting: Internal Medicine

## 2020-07-01 ENCOUNTER — Encounter (HOSPITAL_COMMUNITY)
Admission: EM | Disposition: A | Discharge status: Home / Self Care | Payer: Self-pay | Source: Home / Self Care | Attending: Family Medicine

## 2020-07-01 DIAGNOSIS — R1013 Epigastric pain: Secondary | ICD-10-CM | POA: Diagnosis not present

## 2020-07-01 DIAGNOSIS — R112 Nausea with vomiting, unspecified: Secondary | ICD-10-CM | POA: Diagnosis not present

## 2020-07-01 DIAGNOSIS — R1310 Dysphagia, unspecified: Secondary | ICD-10-CM | POA: Diagnosis not present

## 2020-07-01 HISTORY — PX: ESOPHAGOGASTRODUODENOSCOPY (EGD) WITH PROPOFOL: SHX5813

## 2020-07-01 LAB — BASIC METABOLIC PANEL
Anion gap: 19 — ABNORMAL HIGH (ref 5–15)
BUN: 46 mg/dL — ABNORMAL HIGH (ref 6–20)
CO2: 26 mmol/L (ref 22–32)
Calcium: 9.2 mg/dL (ref 8.9–10.3)
Chloride: 94 mmol/L — ABNORMAL LOW (ref 98–111)
Creatinine, Ser: 8.62 mg/dL — ABNORMAL HIGH (ref 0.44–1.00)
GFR, Estimated: 5 mL/min — ABNORMAL LOW (ref >60)
Glucose, Bld: 81 mg/dL (ref 70–99)
Potassium: 4 mmol/L (ref 3.5–5.1)
Sodium: 139 mmol/L (ref 135–145)

## 2020-07-01 LAB — CBC
HCT: 32.2 % — ABNORMAL LOW (ref 36.0–46.0)
Hemoglobin: 9.7 g/dL — ABNORMAL LOW (ref 12.0–15.0)
MCH: 23.8 pg — ABNORMAL LOW (ref 26.0–34.0)
MCHC: 30.1 g/dL (ref 30.0–36.0)
MCV: 79.1 fL — ABNORMAL LOW (ref 80.0–100.0)
Platelets: 224 10*3/uL (ref 150–400)
RBC: 4.07 MIL/uL (ref 3.87–5.11)
RDW: 19.4 % — ABNORMAL HIGH (ref 11.5–15.5)
WBC: 5.9 10*3/uL (ref 4.0–10.5)
nRBC: 0 % (ref 0.0–0.2)

## 2020-07-01 LAB — HEMOGLOBIN A1C
Hgb A1c MFr Bld: 4.9 % (ref 4.8–5.6)
Mean Plasma Glucose: 94 mg/dL

## 2020-07-01 LAB — MAGNESIUM: Magnesium: 2.1 mg/dL (ref 1.7–2.4)

## 2020-07-01 LAB — PHOSPHORUS: Phosphorus: 7.2 mg/dL — ABNORMAL HIGH (ref 2.5–4.6)

## 2020-07-01 SURGERY — ESOPHAGOGASTRODUODENOSCOPY (EGD) WITH PROPOFOL
Anesthesia: Monitor Anesthesia Care

## 2020-07-01 MED ORDER — LIDOCAINE-PRILOCAINE 2.5-2.5 % EX CREA
1.0000 "application " | TOPICAL_CREAM | CUTANEOUS | Status: DC | PRN
  Filled 2020-07-01: qty 5

## 2020-07-01 MED ORDER — IPRATROPIUM-ALBUTEROL 0.5-2.5 (3) MG/3ML IN SOLN
3.0000 mL | Freq: Four times a day (QID) | RESPIRATORY_TRACT | Status: AC | PRN
  Administered 2020-07-02: 3 mL via RESPIRATORY_TRACT
  Filled 2020-07-01: qty 3

## 2020-07-01 MED ORDER — PROPOFOL 500 MG/50ML IV EMUL
INTRAVENOUS | Status: DC | PRN
  Administered 2020-07-01: 150 ug/kg/min via INTRAVENOUS

## 2020-07-01 MED ORDER — CHLORHEXIDINE GLUCONATE CLOTH 2 % EX PADS
6.0000 | MEDICATED_PAD | Freq: Every day | CUTANEOUS | Status: DC
  Administered 2020-07-01: 6 via TOPICAL

## 2020-07-01 MED ORDER — HYDRALAZINE HCL 20 MG/ML IJ SOLN
20.0000 mg | Freq: Once | INTRAMUSCULAR | Status: AC
  Administered 2020-07-01: 20 mg via INTRAVENOUS

## 2020-07-01 MED ORDER — HEPARIN SODIUM (PORCINE) 1000 UNIT/ML DIALYSIS
1000.0000 [IU] | INTRAMUSCULAR | Status: DC | PRN
  Filled 2020-07-01: qty 1

## 2020-07-01 MED ORDER — PENTAFLUOROPROP-TETRAFLUOROETH EX AERO: 1.0000 "application " | INHALATION_SPRAY | CUTANEOUS | Status: DC | PRN

## 2020-07-01 MED ORDER — SODIUM CHLORIDE 0.9 % IV SOLN: INTRAVENOUS | Status: DC

## 2020-07-01 MED ORDER — LEVOFLOXACIN 500 MG PO TABS
750.0000 mg | ORAL_TABLET | Freq: Once | ORAL | Status: AC
  Administered 2020-07-01: 750 mg via ORAL
  Filled 2020-07-01: qty 2

## 2020-07-01 MED ORDER — ALTEPLASE 2 MG IJ SOLR
2.0000 mg | Freq: Once | INTRAMUSCULAR | Status: DC | PRN
  Filled 2020-07-01: qty 2

## 2020-07-01 MED ORDER — SODIUM CHLORIDE 0.9 % IV SOLN: 100.0000 mL | INTRAVENOUS | Status: DC | PRN

## 2020-07-01 MED ORDER — LEVOFLOXACIN 500 MG PO TABS: 500.0000 mg | ORAL_TABLET | Freq: Once | ORAL | Status: DC

## 2020-07-01 MED ORDER — HYDRALAZINE HCL 50 MG PO TABS
50.0000 mg | ORAL_TABLET | Freq: Three times a day (TID) | ORAL | Status: DC
  Administered 2020-07-01 – 2020-07-02 (×3): 50 mg via ORAL
  Filled 2020-07-01 (×4): qty 1

## 2020-07-01 MED ORDER — LIDOCAINE 2% (20 MG/ML) 5 ML SYRINGE
INTRAMUSCULAR | Status: DC | PRN
  Administered 2020-07-01: 50 mg via INTRAVENOUS

## 2020-07-01 MED ORDER — LIDOCAINE HCL (PF) 1 % IJ SOLN
5.0000 mL | INTRAMUSCULAR | Status: DC | PRN
  Filled 2020-07-01: qty 5

## 2020-07-01 MED ORDER — PROPOFOL 10 MG/ML IV BOLUS
INTRAVENOUS | Status: DC | PRN
  Administered 2020-07-01: 20 mg via INTRAVENOUS

## 2020-07-01 MED ORDER — HYDRALAZINE HCL 20 MG/ML IJ SOLN
INTRAMUSCULAR | Status: AC
  Filled 2020-07-01: qty 1

## 2020-07-01 SURGICAL SUPPLY — 15 items

## 2020-07-01 NOTE — Procedures (Signed)
I was present at this dialysis session. I have reviewed the session itself and made appropriate changes.    EGD earlier today for dysphagia/odynophagia. Reassuring findings.  On HD per usual schedule. Says only wants to run 2h, we will orde the full tx and encourage her to stay the whole time.  Filed Weights   06/29/20 1308  Weight: 68 kg    Recent Labs  Lab 07/01/20 0832  NA 139  K 4.0  CL 94*  CO2 26  GLUCOSE 81  BUN 46*  CREATININE 8.62*  CALCIUM 9.2  PHOS 7.2*    Recent Labs  Lab 06/29/20 1310 07/01/20 0832  WBC 5.5 5.9  NEUTROABS 3.9  --   HGB 10.6* 9.7*  HCT 36.2 32.2*  MCV 81.9 79.1*  PLT 212 224    Scheduled Meds:  aspirin  81 mg Oral Daily   Chlorhexidine Gluconate Cloth  6 each Topical Q0600   cloNIDine  0.1 mg Transdermal Weekly   clopidogrel  75 mg Oral Q breakfast   cyclobenzaprine  10 mg Oral TID   fluticasone furoate-vilanterol  1 puff Inhalation Daily   heparin  5,000 Units Subcutaneous Q8H   hydrALAZINE  50 mg Oral Q8H   insulin aspart  0-6 Units Subcutaneous TID WC   [START ON 07/03/2020] levofloxacin  500 mg Oral Once   levofloxacin  750 mg Oral Once   metoCLOPramide (REGLAN) injection  10 mg Intravenous Q8H   montelukast  10 mg Oral QHS   pantoprazole  40 mg Oral Q supper   Continuous Infusions:  sodium chloride     sodium chloride     PRN Meds:.sodium chloride, sodium chloride, albuterol, alteplase, heparin, lidocaine (PF), lidocaine-prilocaine, pentafluoroprop-tetrafluoroeth   Pearson Grippe  MD 07/01/2020, 3:08 PM

## 2020-07-01 NOTE — Anesthesia Preprocedure Evaluation (Signed)
Anesthesia Evaluation  Patient identified by MRN, date of birth, ID band Patient awake    Reviewed: Allergy & Precautions, NPO status , Patient's Chart, lab work & pertinent test results  Airway Mallampati: II  TM Distance: >3 FB Neck ROM: Full    Dental no notable dental hx.    Pulmonary neg pulmonary ROS, former smoker,    Pulmonary exam normal breath sounds clear to auscultation       Cardiovascular hypertension, + CAD and + Cardiac Stents  Normal cardiovascular exam Rhythm:Regular Rate:Normal     Neuro/Psych negative neurological ROS  negative psych ROS   GI/Hepatic negative GI ROS, Neg liver ROS,   Endo/Other  diabetes  Renal/GU DialysisRenal disease  negative genitourinary   Musculoskeletal negative musculoskeletal ROS (+)   Abdominal   Peds negative pediatric ROS (+)  Hematology negative hematology ROS (+) Blood dyscrasia, anemia ,   Anesthesia Other Findings   Reproductive/Obstetrics negative OB ROS                             Anesthesia Physical  Anesthesia Plan  ASA: 3  Anesthesia Plan: MAC   Post-op Pain Management:    Induction: Intravenous  PONV Risk Score and Plan: 3  Airway Management Planned: Natural Airway and Simple Face Mask  Additional Equipment: None  Intra-op Plan:   Post-operative Plan:   Informed Consent: I have reviewed the patients History and Physical, chart, labs and discussed the procedure including the risks, benefits and alternatives for the proposed anesthesia with the patient or authorized representative who has indicated his/her understanding and acceptance.     Dental advisory given  Plan Discussed with: CRNA and Anesthesiologist  Anesthesia Plan Comments:         Anesthesia Quick Evaluation

## 2020-07-01 NOTE — Progress Notes (Signed)
Paged Hospitalist on call stating pt would like a breathing treatment to be ordered

## 2020-07-01 NOTE — Progress Notes (Signed)
PROGRESS NOTE    Susan Horton  WFU:[id number] DOB: Apr 08, 1970 DOA: 06/29/2020 PCP: Charlott Rakes, MD   Brief Narrative: This 50 years old female with PMH significant for diabetic gastroparesis,  s/p Botox injection, insulin-dependent diabetes mellitus, end-stage renal disease on hemodialysis TTS, hypertension, hyperlipidemia, CAD with stents presented in the ED with intractable nausea and vomiting. Patient had chronic gastroparesis with frequent flare ups,  she has been on Reglan and as needed Zofran.  She recently underwent EGD and Botox injection on the pylorus on June 2.  Since the procedure patient is experiencing worsening of dysphagia. CT abdomen shows nonspecific fatty stranding in the retroperitoneal area.  General surgery was consulted,  recommended symptomatic management.  GI recommended barium swallow, patient underwent repeat EGD which was completely normal.  Assessment & Plan:   Active Problems:   Intractable nausea and vomiting   Abdominal pain, epigastric   Dysphagia   Intractable nausea with vomiting and dysphagia: Patient reports intractable nausea and vomiting and difficulty swallowing post Botox injection. She reports worsening gastroparesis symptoms.   Possible patient may have developed pylorus edema post injection, and subsequent partial gastric outlet obstruction Change p.o. Reglan and Zofran to IV form. Continue PPI. Obtain barium swallow study. GI consulted, increased Reglan to 10 mg every 8 hours.   Patient underwent repeat EGD which was completely normal. GI thinks this could be due to flare of gastroparesis, continue Reglan.  Aspiration PNA ? Probably related to repeated nauseous vomit, received 1 dose of Levaquin in ED, re-dose after 48 hours. Pharmacy consulted for Levaquin dosing.   Essential hypertension: Elevated. Likely secondary to noncompliance with BP meds due to swallowing problems.   Continue IV hydralazine every 8 hours, and clonidine  patch until patient can swallow.   ESRD on HD Euvolemic, , had hemo dialysis yesterday,   next session Tuesday. Nephrology consulted for continuation of hemodialysis.   CAD Stable,   Diabetic gastroparesis Continue Reglan 10 mg every 8 hours.   IDDM Continue Sliding scale,   DVT prophylaxis: Heparin sq Code Status: Full code. Family Communication: No family at bed side. Disposition Plan:   Status is: Inpatient  Remains inpatient appropriate because:Inpatient level of care appropriate due to severity of illness  Dispo: The patient is from: Home              Anticipated d/c is to: Home              Patient currently is not medically stable to d/c.   Difficult to place patient No  Consultants:  GI Surgery  Procedures:  Antimicrobials:   Anti-infectives (From admission, onward)    Start     Dose/Rate Route Frequency Ordered Stop   06/29/20 1845  levofloxacin (LEVAQUIN) IVPB 750 mg        750 mg 100 mL/hr over 90 Minutes Intravenous  Once 06/29/20 1831 06/29/20 2303       Subjective: Patient was seen and examined at bedside.  Overnight events noted.   She still reports persistent nausea and vomiting and also reports abdominal pain after she eats meals. She underwent EGD which was completely normal.  Objective: Vitals:   07/01/20 1205 07/01/20 1215 07/01/20 1226 07/01/20 1243  BP: (!) 184/65 (!) 195/69 (!) 196/97 (!) 184/73  Pulse: 100 (!) 103 (!) 105 99  Resp: 20 (!) 21 18 20   Temp: 98.7 F (37.1 C)   97.6 F (36.4 C)  TempSrc: Oral   Oral  SpO2: 100% 100% 100% 96%  Weight:      Height:        Intake/Output Summary (Last 24 hours) at 07/01/2020 1352 Last data filed at 07/01/2020 1201 Gross per 24 hour  Intake 100 ml  Output 0 ml  Net 100 ml   Filed Weights   06/29/20 1308  Weight: 68 kg    Examination:  General exam: Appears calm and comfortable, not in any acute distress. Respiratory system: Clear to auscultation. Respiratory effort  normal. Cardiovascular system: S1 & S2 heard, RRR. No JVD, murmurs, rubs, gallops or clicks. No pedal edema. Gastrointestinal system: Abdomen is nondistended, soft , mildly tender ,  No organomegaly or masses felt. Normal bowel sounds heard. Central nervous system: Alert and oriented. No focal neurological deficits. Extremities: Symmetric 5 x 5 power.  No edema, no cyanosis, no clubbing. Skin: No rashes, lesions or ulcers Psychiatry: Judgement and insight appear normal. Mood & affect appropriate.     Data Reviewed: I have personally reviewed following labs and imaging studies  CBC: Recent Labs  Lab 06/29/20 1310 07/01/20 0832  WBC 5.5 5.9  NEUTROABS 3.9  --   HGB 10.6* 9.7*  HCT 36.2 32.2*  MCV 81.9 79.1*  PLT 212 161   Basic Metabolic Panel: Recent Labs  Lab 06/29/20 1310 07/01/20 0832  NA 138 139  K 3.7 4.0  CL 93* 94*  CO2 31 26  GLUCOSE 97 81  BUN 26* 46*  CREATININE 6.09* 8.62*  CALCIUM 9.6 9.2  MG  --  2.1  PHOS  --  7.2*   GFR: Estimated Creatinine Clearance: 7.1 mL/min (A) (by C-G formula based on SCr of 8.62 mg/dL (H)). Liver Function Tests: Recent Labs  Lab 06/29/20 1310  AST 21  ALT 24  ALKPHOS 101  BILITOT 1.0  PROT 7.1  ALBUMIN 3.3*   Recent Labs  Lab 06/29/20 1415  LIPASE 25   No results for input(s): AMMONIA in the last 168 hours. Coagulation Profile: No results for input(s): INR, PROTIME in the last 168 hours. Cardiac Enzymes: No results for input(s): CKTOTAL, CKMB, CKMBINDEX, TROPONINI in the last 168 hours. BNP (last 3 results) No results for input(s): PROBNP in the last 8760 hours. HbA1C: Recent Labs    06/30/20 0246  HGBA1C 4.9   CBG: Recent Labs  Lab 06/29/20 2157  GLUCAP 106*   Lipid Profile: No results for input(s): CHOL, HDL, LDLCALC, TRIG, CHOLHDL, LDLDIRECT in the last 72 hours. Thyroid Function Tests: No results for input(s): TSH, T4TOTAL, FREET4, T3FREE, THYROIDAB in the last 72 hours. Anemia Panel: No  results for input(s): VITAMINB12, FOLATE, FERRITIN, TIBC, IRON, RETICCTPCT in the last 72 hours. Sepsis Labs: Recent Labs  Lab 06/30/20 1342  PROCALCITON 0.22    Recent Results (from the past 240 hour(s))  Resp Panel by RT-PCR (Flu A&B, Covid) Nasopharyngeal Swab     Status: None   Collection Time: 06/29/20  4:38 PM   Specimen: Nasopharyngeal Swab; Nasopharyngeal(NP) swabs in vial transport medium  Result Value Ref Range Status   SARS Coronavirus 2 by RT PCR NEGATIVE NEGATIVE Final    Comment: (NOTE) SARS-CoV-2 target nucleic acids are NOT DETECTED.  The SARS-CoV-2 RNA is generally detectable in upper respiratory specimens during the acute phase of infection. The lowest concentration of SARS-CoV-2 viral copies this assay can detect is 138 copies/mL. A negative result does not preclude SARS-Cov-2 infection and should not be used as the sole basis for treatment or other patient management decisions. A negative result may occur with  improper specimen collection/handling, submission of specimen other than nasopharyngeal swab, presence of viral mutation(s) within the areas targeted by this assay, and inadequate number of viral copies(<138 copies/mL). A negative result must be combined with clinical observations, patient history, and epidemiological information. The expected result is Negative.  Fact Sheet for Patients:  EntrepreneurPulse.com.au  Fact Sheet for Healthcare Providers:  IncredibleEmployment.be  This test is no t yet approved or cleared by the [place] FDA and  has been authorized for detection and/or diagnosis of SARS-CoV-2 by FDA under an Emergency Use Authorization (EUA). This EUA will remain  in effect (meaning this test can be used) for the duration of the COVID-19 declaration under Section 564(b)(1) of the Act, 21 U.S.C.section 360bbb-3(b)(1), unless the authorization is terminated  or revoked sooner.       Influenza  A by PCR NEGATIVE NEGATIVE Final   Influenza B by PCR NEGATIVE NEGATIVE Final    Comment: (NOTE) The Xpert Xpress SARS-CoV-2/FLU/RSV plus assay is intended as an aid in the diagnosis of influenza from Nasopharyngeal swab specimens and should not be used as a sole basis for treatment. Nasal washings and aspirates are unacceptable for Xpert Xpress SARS-CoV-2/FLU/RSV testing.  Fact Sheet for Patients: EntrepreneurPulse.com.au  Fact Sheet for Healthcare Providers: IncredibleEmployment.be  This test is not yet approved or cleared by the [place] FDA and has been authorized for detection and/or diagnosis of SARS-CoV-2 by FDA under an Emergency Use Authorization (EUA). This EUA will remain in effect (meaning this test can be used) for the duration of the COVID-19 declaration under Section 564(b)(1) of the Act, 21 U.S.C. section 360bbb-3(b)(1), unless the authorization is terminated or revoked.  Performed at [facility], [place] [address] [place], [place] [address]     Radiology Studies: CT Abdomen Pelvis Wo Contrast  Result Date: 06/29/2020 CLINICAL DATA:  Suspected bowel obstruction. EXAM: CT ABDOMEN AND PELVIS WITHOUT CONTRAST TECHNIQUE: Multidetector CT imaging of the abdomen and pelvis was performed following the standard protocol without IV contrast. COMPARISON:  February 24, 2019 FINDINGS: Lower chest: Small right pleural effusion. Calcific atherosclerotic disease of the aorta and coronary arteries. Mild peripheral ground-glass opacities in the lung bases. Hepatobiliary: No focal liver abnormality is seen. Status post cholecystectomy. No biliary dilatation. Pancreas: Unremarkable. No pancreatic ductal dilatation or surrounding inflammatory changes. Spleen: Normal in size without focal abnormality. Adrenals/Urinary Tract: Adrenal glands are unremarkable. Kidneys are normal, without renal calculi, focal lesion, or hydronephrosis. Bladder is  unremarkable. Stomach/Bowel: Stomach is within normal limits. Appendix appears normal. No evidence of bowel wall thickening, distention, or inflammatory changes. Vascular/Lymphatic: Aortic atherosclerosis. No enlarged abdominal or pelvic lymph nodes. Reproductive: Status post hysterectomy. No adnexal masses. Other: Mesenteric and retroperitoneal fat stranding and small amount of free retroperitoneal fluid, mostly right-sided. Musculoskeletal: No acute or significant osseous findings. Coarse calcifications adjacent to the left greater trochanter, likely dystrophic. Soft tissue thickening and small amount of gas likely represent right sacral wound. No evidence of involvement of the underlying sacrum. IMPRESSION: 1. No evidence of small-bowel obstruction. 2. Mesenteric and retroperitoneal fat stranding and small amount of free retroperitoneal fluid, mostly right-sided. 3. Small right pleural effusion. 4. Mild peripheral ground-glass opacities in the lung bases, which may represent mild pulmonary edema or atypical consolidation. 5. Calcific atherosclerotic disease of the aorta and coronary arteries. 6. Right sacral wound. Aortic Atherosclerosis (ICD10-I70.0). Electronically Signed   By: Fidela Salisbury M.D.   On: 06/29/2020 15:27   DG ESOPHAGUS W SINGLE CM (SOL OR THIN BA)  Result Date: 06/30/2020 CLINICAL DATA:  Progressive dysphagia, nausea and vomiting EXAM: ESOPHOGRAM/BARIUM SWALLOW TECHNIQUE: Single contrast examination was performed using  thin barium. FLUOROSCOPY TIME:  Fluoroscopy Time:  2 minutes 48 seconds Radiation Exposure Index (if provided by the fluoroscopic device): 40.8 mGy Number of Acquired Spot Images: 0 COMPARISON:  None. FINDINGS: The pharyngeal phase of swallowing was normal. Moderate esophageal dysmotility with splitting of the contrast bolus and some delayed distal progression of contrast. Normal esophageal course and contour. No evidence esophageal stricture or ulceration. No hiatal  hernia. No gastroesophageal reflux occurred spontaneously or was elicited. A 13 mm barium tablet passed without difficulty into the stomach. IMPRESSION: Moderate esophageal dysmotility. No hiatal hernia or gastroesophageal reflux elicited. A 13 mm barium tablet passed without difficulty into the stomach. Electronically Signed   By: Maurine Simmering   On: 06/30/2020 14:54     Scheduled Meds:  aspirin  81 mg Oral Daily   Chlorhexidine Gluconate Cloth  6 each Topical Q0600   cloNIDine  0.1 mg Transdermal Weekly   clopidogrel  75 mg Oral Q breakfast   cyclobenzaprine  10 mg Oral TID   fluticasone furoate-vilanterol  1 puff Inhalation Daily   heparin  5,000 Units Subcutaneous Q8H   hydrALAZINE  50 mg Oral Q8H   insulin aspart  0-6 Units Subcutaneous TID WC   metoCLOPramide (REGLAN) injection  10 mg Intravenous Q8H   montelukast  10 mg Oral QHS   pantoprazole  40 mg Oral Q supper   Continuous Infusions:  sodium chloride     sodium chloride       LOS: 2 days    Time spent: 25 mins    Shawna Clamp, MD [facility]   If 7PM-7AM, please contact night-coverage

## 2020-07-01 NOTE — Op Note (Signed)
[facility] Patient Name: Susan Horton Procedure Date : 07/01/2020 MRN: [id number] Attending MD: Gatha Mayer , MD Date of Birth: Sep 27, 1970 CSN: [id number] Age: 50 Admit Type: Inpatient Procedure:                Upper GI endoscopy Indications:              Epigastric abdominal pain, Dysphagia Providers:                Gatha Mayer, MD, Jeanella Cara, RN,                            Tyna Jaksch Technician Referring MD:              Medicines:                Propofol per Anesthesia, Monitored Anesthesia Care Complications:            No immediate complications. Estimated Blood Loss:     Estimated blood loss: none. Procedure:                Pre-Anesthesia Assessment:                           - Prior to the procedure, a History and Physical                            was performed, and patient medications and                            allergies were reviewed. The patient's tolerance of                            previous anesthesia was also reviewed. The risks                            and benefits of the procedure and the sedation                            options and risks were discussed with the patient.                            All questions were answered, and informed consent                            was obtained. Prior Anticoagulants: The patient                            last took Plavix (clopidogrel) 1 day prior to the                            procedure. ASA Grade Assessment: III - A patient                            with severe systemic disease. After reviewing the  risks and benefits, the patient was deemed in                            satisfactory condition to undergo the procedure.                           After obtaining informed consent, the endoscope was                            passed under direct vision. Throughout the                            procedure, the patient's blood pressure, pulse, and                             oxygen saturations were monitored continuously. The                            GIF-H190 ([id number] Olympus gastroscope was                            introduced through the mouth, and advanced to the                            second part of duodenum. The upper GI endoscopy was                            accomplished without difficulty. The patient                            tolerated the procedure well. The upper GI                            endoscopy was accomplished without difficulty. The                            patient tolerated the procedure well. Scope In: Scope Out: Findings:      The esophagus was normal.      The stomach was normal.      The examined duodenum was normal.      The cardia and gastric fundus were normal on retroflexion. Impression:               - Normal esophagus.                           - Normal stomach. (some white nmucosa at pylorus -                            not clinically sig I think)                           - Normal examined duodenum.                           - No specimens  collected. NO SIG PATHOLOGY HERE -                            CAUSE OF SXS NOT CLEAR - WONDERING ABOUT                            GASTROPARESIS FLARE - I WAS NOT CONVINCED SHE WAS                            ACTUALLY HAVING DYSPHAGIA AS OPPOSED TO EARLY                            SATIETY AND GASTRIC SXS Recommendation:           - Return patient to hospital ward for ongoing care.                           - feed                           taper Reglan to prn as possible                           dc when you think ready                           GI f/u w/ Dr. Derrill Kay [facility]                           GI signing off Procedure Code(s):        --- Professional ---                           [phone], Esophagogastroduodenoscopy, flexible,                            transoral; diagnostic, including collection of                            specimen(s) by brushing or  washing, when performed                            (separate procedure) Diagnosis Code(s):        --- Professional ---                           R10.13, Epigastric pain                           R13.10, Dysphagia, unspecified CPT copyright 2019 American Medical Association. All rights reserved. The codes documented in this report are preliminary and upon coder review may  be revised to meet current compliance requirements. Gatha Mayer, MD 07/01/2020 12:13:08 PM This report has been signed electronically. Number of Addenda: 0

## 2020-07-01 NOTE — Progress Notes (Signed)
Pt refused blood sugar check.

## 2020-07-01 NOTE — Progress Notes (Addendum)
Pharmacy Antibiotic Note  Susan Horton is a 50 y.o. female admitted on 06/29/2020 with suspected aspiration pneumonia.  Pharmacy has been consulted for levofloxacin dosing given hx of anaphylaxis to PCN. Never received cephalosporins in our system.   Pct 0.22, WBC wnl, afebrile. CXR: Ill-defined opacity within the left lung base, suspicious for pneumonia. Patient is on HD   Plan: Levofloxacin 750mg  x1 then 500mg  every other day for 3 days    Height: 5\' 5"  (165.1 cm) Weight: 68 kg (150 lb) IBW/kg (Calculated) : 57  Temp (24hrs), Avg:98.7 F (37.1 C), Min:97.6 F (36.4 C), Max:99.5 F (37.5 C)  Recent Labs  Lab 06/29/20 1310 07/01/20 0832  WBC 5.5 5.9  CREATININE 6.09* 8.62*    Estimated Creatinine Clearance: 7.1 mL/min (A) (by C-G formula based on SCr of 8.62 mg/dL (H)).    Allergies  Allergen Reactions   Penicillins Anaphylaxis, Itching, Swelling, Rash and Other (See Comments)    Swelling of throat & whole mouth  Has patient had a PCN reaction causing immediate rash, facial/tongue/throat swelling, SOB or lightheadedness with hypotension: Yes Has patient had a PCN reaction causing severe rash involving mucus membranes or skin necrosis: No Has patient had a PCN reaction that required hospitalization: No Has patient had a PCN reaction occurring within the last 10 years: No If all of the above answers are "NO", then may proceed with Cephalosporin use.   Repatha [Evolocumab] Itching   Lisinopril Cough    Antimicrobials this admission: levoflox 6/21 >> 6/23     Thank you for allowing pharmacy to be a part of this patient's care.  Benetta Spar, PharmD, BCPS, BCCP Clinical Pharmacist  Please check AMION for all [place] phone numbers After 10:00 PM, call [place] [phone]

## 2020-07-01 NOTE — Transfer of Care (Signed)
Immediate Anesthesia Transfer of Care Note  Patient: Susan Horton  Procedure(s) Performed: ESOPHAGOGASTRODUODENOSCOPY (EGD) WITH PROPOFOL  Patient Location: PACU and Endoscopy Unit  Anesthesia Type:MAC  Level of Consciousness: awake, alert  and oriented  Airway & Oxygen Therapy: Patient Spontanous Breathing and Patient connected to nasal cannula oxygen  Post-op Assessment: Report given to RN and Post -op Vital signs reviewed and stable  Post vital signs: Reviewed and stable  Last Vitals:  Vitals Value Taken Time  BP    Temp    Pulse    Resp    SpO2      Last Pain:  Vitals:   07/01/20 1118  TempSrc:   PainSc: 4       Patients Stated Pain Goal: 2 ([id number])  Complications: No notable events documented.

## 2020-07-01 NOTE — Progress Notes (Signed)
Pt cut off 1 hr in hemodialysis Tx d/t tired. Dr. Joelyn Oms notified and AMA form sighed. Report given to [place]. Christy Sartorius RN

## 2020-07-01 NOTE — Consult Note (Signed)
[facility] Renal Consultation Note    Indication for Consultation:  Management of ESRD/hemodialysis; anemia, hypertension/volume and secondary hyperparathyroidism  KGM:WNUUVO, Charlane Ferretti, MD  HPI: Susan Horton is a 50 y.o. female with ESRD who receives on HD TTS at [facility]. Her past medical history significant for diabetic gastroparesis s/p botox injection, IDDM, HTN, HLD, and CAD. Her last HD treatment outpatient was on 6/18 for 3hrs. She reports worsening SOB shortly after treatments. She reports difficulty with simply catching her breath. She then c/o continuous cough with vomiting, prompting her to go to the ER for further evaluation. Review records in [place]. She underwent EGD and Botox injections on June 2-apparently, she was experiencing worsening dysphagia since the procedure. She denies fevers, chills, CP, and diarrhea. CT ABD/Pelvis shows fat stranding retroperitoneal area. GI is following and patient underwent EGD earlier today. CXR shows an ill-defined opacity within the lung base suspicious for pneumonia-PO ABX currently prescribed. Current lab work shows: K+ 4.0, CO2 26, BUN 46, SrCr 8.62, PO4 7.2, and Hgb 9.7. Patient received her scheduled HD treatment earlier today.  Past Medical History:  Diagnosis Date   Abnormal stress test    Anemia    Angio-edema    Asthma    CAD (coronary artery disease)    DES to mid LAD July 2018, residual moderate RCA disease   Cataract    CKD (chronic kidney disease) stage 4, GFR 15-29 ml/min (HCC)    Dialysis T/Th/Sa   DVT (deep venous thrombosis) ([place])    1996 during pregnancy, 2015 left leg   Eczema    Essential hypertension 12/11/2015   Gastroparesis    GERD (gastroesophageal reflux disease)    Gluteal abscess 12/27/2018   Hidradenitis    Migraine    Neuropathy    Peripheral vascular disease (HCC)    blood clot in leg   Progressive angina ([place]) 06/05/2014   Chest pain   Type 2 diabetes mellitus ([place])     Type II diabetes mellitus ([place])    Urticaria    Past Surgical History:  Procedure Laterality Date   A/V FISTULAGRAM N/A 06/22/2017   Procedure: A/V FISTULAGRAM - left arm;  Surgeon: [name], MD;  Location: [place] [facility];  Service: Cardiovascular;  Laterality: N/A;   ABDOMINAL AORTOGRAM W/LOWER EXTREMITY Bilateral 12/14/2018   Procedure: ABDOMINAL AORTOGRAM W/LOWER EXTREMITY;  Surgeon: [name], MD;  Location: [place] [facility];  Service: Cardiovascular;  Laterality: Bilateral;   ABDOMINAL AORTOGRAM W/LOWER EXTREMITY N/A 04/25/2019   Procedure: ABDOMINAL AORTOGRAM W/LOWER EXTREMITY;  Surgeon: [name], MD;  Location: [place] [facility];  Service: Cardiovascular;  Laterality: N/A;   ABDOMINAL AORTOGRAM W/LOWER EXTREMITY Left 07/18/2019   Procedure: ABDOMINAL AORTOGRAM W/LOWER EXTREMITY;  Surgeon: [name], MD;  Location: [place] [facility];  Service: Cardiovascular;  Laterality: Left;   ABDOMINOPLASTY     ADENOIDECTOMY     APPENDECTOMY  1995   AV FISTULA PLACEMENT Left 02/01/2017   Procedure: ARTERIOVENOUS BRACHIOCEPHALIC (AV) FISTULA CREATION;  Surgeon: [name], MD;  Location: [place] [facility];  Service: Vascular;  Laterality: Left;   [place] INTERVENTION N/A 08/05/2016   Procedure: Coronary Stent Intervention;  Surgeon: [name], MD;  Location: [place] [facility];  Service: Cardiovascular;  Laterality: N/A;   EXPLORATORY LAPAROTOMY  08/14/2005   lysis of adhesions, drainage of tubo-ovarian abscess   FISTULA  SUPERFICIALIZATION Left 09/14/2017   Procedure: FISTULA SUPERFICIALIZATION ARTERIOVENOUS FISTULA LEFT ARM;  Surgeon: [name], MD;  Location: [place];  Service: Vascular;  Laterality: Left;   FLEXIBLE SIGMOIDOSCOPY N/A 01/04/2019   Procedure: FLEXIBLE SIGMOIDOSCOPY;  Surgeon: Clarene Essex, MD;  Location: [place];  Service: Endoscopy;   Laterality: N/A;   HYDRADENITIS EXCISION  02/08/2011   Procedure: EXCISION HYDRADENITIS GROIN;  Surgeon: [name], MD;  Location: [place];  Service: General;  Laterality: N/A;  Excisioin of Hidradenitis Left groin   INCISION AND DRAINAGE ABSCESS N/A 01/11/2019   Procedure: INCISION AND DRAINAGE SACRAL ABSCESS;  Surgeon: Georganna Skeans, MD;  Location: [place];  Service: General;  Laterality: N/A;   INGUINAL HIDRADENITIS EXCISION  07/06/2010   bilateral   INSERTION OF DIALYSIS CATHETER N/A 09/14/2017   Procedure: INSERTION OF TUNNELED DIALYSIS CATHETER Right Internal Jugular;  Surgeon: [name], MD;  Location: [place];  Service: Vascular;  Laterality: N/A;   IR FLUORO GUIDE CV LINE RIGHT  03/01/2019   IR US GUIDE BX ASP/DRAIN  01/01/2019   IR US GUIDE VASC ACCESS RIGHT  03/01/2019   LEFT HEART CATH AND CORONARY ANGIOGRAPHY N/A 08/05/2016   Procedure: Left Heart Cath and Coronary Angiography;  Surgeon: [name], MD;  Location: [place] [facility];  Service: Cardiovascular;  Laterality: N/A;   PERIPHERAL VASCULAR INTERVENTION Left 12/14/2018   Procedure: PERIPHERAL VASCULAR INTERVENTION;  Surgeon: [name], MD;  Location: [place] [facility];  Service: Cardiovascular;  Laterality: Left;  SFA   PERIPHERAL VASCULAR INTERVENTION Left 04/25/2019   Procedure: PERIPHERAL VASCULAR INTERVENTION;  Surgeon: [name], MD;  Location: [place] [facility];  Service: Cardiovascular;  Laterality: Left;  right superficial femoral, and left iliac   PERIPHERAL VASCULAR INTERVENTION Left 07/18/2019   Procedure: PERIPHERAL VASCULAR INTERVENTION;  Surgeon: [name], MD;  Location: [place] [facility];  Service: Cardiovascular;  Laterality: Left;  external iliac   REDUCTION MAMMAPLASTY  2002   TONSILLECTOMY     TUBAL LIGATION  1996   VAGINAL HYSTERECTOMY  08/04/2005   and cysto   Family History  Problem Relation Age of Onset   Allergic rhinitis  Mother    Hypertension Father    Allergic rhinitis Father    Hypertension Sister    Allergic rhinitis Sister    Hypertension Brother    Allergic rhinitis Brother    Breast cancer Cousin        x2   Social History:  reports that she quit smoking about 3 years ago. Her smoking use included cigarettes. She has never used smokeless tobacco. She reports current drug use. Frequency: 2.00 times per week. Drug: Marijuana. She reports that she does not drink alcohol. Allergies  Allergen Reactions   Penicillins Anaphylaxis, Itching, Swelling, Rash and Other (See Comments)    Swelling of throat & whole mouth  Has patient had a PCN reaction causing immediate rash, facial/tongue/throat swelling, SOB or lightheadedness with hypotension: Yes Has patient had a PCN reaction causing severe rash involving mucus membranes or skin necrosis: No Has patient had a PCN reaction that required hospitalization: No Has patient had a PCN reaction occurring within the last 10 years: No If all of the above answers are "NO", then may proceed with Cephalosporin use.   Repatha [Evolocumab] Itching   Lisinopril Cough   Prior to Admission medications   Medication Sig Start Date End Date Taking? Authorizing Provider  albuterol (PROVENTIL HFA;VENTOLIN HFA) 108 (  90 BASE) MCG/ACT inhaler Inhale 1-2 puffs into the lungs every 6 (six) hours as needed for wheezing or shortness of breath.    Yes [provider]  amLODipine (NORVASC) 10 MG tablet Take 1 tablet (10 mg total) by mouth daily. 08/06/19  Yes [name], MD  aspirin 81 MG chewable tablet Chew 1 tablet (81 mg total) by mouth daily. 08/07/16  Yes Reino Bellis B, NP  atorvastatin (LIPITOR) 40 MG tablet TAKE 1 TABLET BY MOUTH EVERY DAY Patient taking differently: Take 40 mg by mouth daily. 01/09/20  Yes [name], MD  B Complex-C-Zn-Folic Acid (DIALYVITE/ZINC) TABS Take 1 tablet by mouth daily. 03/25/20  Yes [provider]  calcitRIOL  (ROCALTROL) 0.5 MCG capsule Take 1.5 mcg by mouth daily. 09/11/18  Yes [provider]  calcium acetate (PHOSLO) 667 MG capsule Take 667 mg by mouth 3 (three) times daily with meals. 09/23/17  Yes [provider]  clopidogrel (PLAVIX) 75 MG tablet Take 1 tablet (75 mg total) by mouth daily with breakfast. 08/06/19  Yes [name], MD  fluconazole (DIFLUCAN) 150 MG tablet Take 150 mg by mouth See admin instructions. Take one tablet (150 mg) by mouth one day as directed, then take 2nd tablet (150 mg) 48 hours later 06/24/20  Yes [provider]  Fluticasone-Salmeterol (ADVAIR) 250-50 MCG/DOSE AEPB Inhale 1 puff into the lungs 2 (two) times daily. Patient taking differently: Inhale 1 puff into the lungs 2 (two) times daily as needed (wheezing). 06/10/17  Yes Charlott Rakes, MD  hydrALAZINE (APRESOLINE) 50 MG tablet Take 50 mg by mouth every 8 (eight) hours. 06/18/20  Yes [provider]  isosorbide mononitrate (IMDUR) 30 MG 24 hr tablet Take 0.5 tablets (15 mg total) by mouth daily. 08/06/19  Yes [name], MD  methocarbamol (ROBAXIN) 500 MG tablet Take 1 tablet (500 mg total) by mouth 2 (two) times daily as needed for muscle spasms. 09/07/17  Yes Jamse Arn, MD  metoCLOPramide (REGLAN) 5 MG tablet Take 1 tablet (5 mg total) by mouth every 8 (eight) hours as needed for nausea. 03/22/20  Yes Jacqlyn Larsen, PA-C  metoprolol succinate (TOPROL-XL) 50 MG 24 hr tablet Take 75 mg (1.5 tablet) daily Patient taking differently: Take 75 mg by mouth daily. 07/03/19  Yes [name], MD  montelukast (SINGULAIR) 10 MG tablet Take 1 tablet (10 mg total) by mouth at bedtime. 04/12/18  Yes Padgett, Rae Halsted, MD  nitroGLYCERIN (NITROSTAT) 0.4 MG SL tablet Place 1 tablet (0.4 mg total) under the tongue every 5 (five) minutes as needed for chest pain. 08/06/19  Yes [name], MD  PRALUENT 75 MG/ML SOAJ INJECT 75 MG INTO THE SKIN EVERY 14 (FOURTEEN) DAYS.  02/07/20  Yes [name], MD  sucralfate (CARAFATE) 1 GM/10ML suspension Take 10 mLs (1 g total) by mouth 4 (four) times daily -  with meals and at bedtime. 03/22/20  Yes Jacqlyn Larsen, PA-C  trimethoprim-polymyxin b (POLYTRIM) ophthalmic solution Place 1 drop into both eyes See admin instructions. Instill one drop into both eyes four times daily for 2 days after each monthly eye injection 06/16/17  Yes [provider]  amitriptyline (ELAVIL) 10 MG tablet Take 1 tablet (10 mg total) by mouth at bedtime. Patient not taking: No sig reported 09/19/18   Jamse Arn, MD  doxercalciferol (HECTOROL) 4 MCG/2ML injection Doxercalciferol (Hectorol) 09/25/19 09/23/20  [provider]  heparin 1000 unit/mL SOLN injection Heparin Sodium (  Porcine) 1,000 Units/mL Systemic 08/14/19 08/12/20  [provider]  hydrALAZINE (APRESOLINE) 25 MG tablet Take 1 tablet (25 mg total) by mouth 3 (three) times daily. Patient not taking: Reported on 06/30/2020 08/06/19 06/30/20  [name], MD  insulin aspart (NOVOLOG) 100 UNIT/ML injection 0-6 Units, Subcutaneous, 3 times daily with meals CBG < 70: Implement Hypoglycemia measures/call MD CBG 70 - 120: 0 units CBG 121 - 150: 0 units CBG 151 - 200: 1 unit CBG 201-250: 2 units CBG 251-300: 3 units CBG 301-350: 4 units CBG 351-400: 5 units CBG > 400: Give 6 units and call MD Patient not taking: No sig reported 03/02/19   Ghimire, Henreitta Leber, MD  insulin glargine, 2 Unit Dial, (TOUJEO MAX SOLOSTAR) 300 UNIT/ML Solostar Pen Inject 54 Units into the skin at bedtime.  Patient not taking: No sig reported    [provider]  iron sucrose (VENOFER) 20 MG/ML injection Iron Sucrose (Venofer) 07/11/19   [provider]  lidocaine-prilocaine (EMLA) cream Apply 1 application topically as needed. Patient not taking: Reported on 06/30/2020 02/25/20   Criselda Peaches, DPM  Methoxy PEG-Epoetin Beta (MIRCERA IJ) Inject into the skin. 07/09/19   [provider]  ondansetron (ZOFRAN ODT) 4 MG disintegrating tablet Take 1 tablet (4 mg total) by mouth every 8 (eight) hours as needed for nausea or vomiting. Patient not taking: Reported on 06/30/2020 03/02/19   Jonetta Osgood, MD  pantoprazole (PROTONIX) 40 MG tablet Take 1 tablet (40 mg total) by mouth daily. Patient not taking: Reported on 06/30/2020 03/22/20   Jacqlyn Larsen, PA-C   Current Facility-Administered Medications  Medication Dose Route Frequency Provider Last Rate Last Admin   0.9 %  sodium chloride infusion  100 mL Intravenous PRN Gatha Mayer, MD       0.9 %  sodium chloride infusion  100 mL Intravenous PRN Gatha Mayer, MD       albuterol (VENTOLIN HFA) 108 (90 Base) MCG/ACT inhaler 1-2 puff  1-2 puff Inhalation Q6H PRN Gatha Mayer, MD       alteplase (CATHFLO ACTIVASE) injection 2 mg  2 mg Intracatheter Once PRN Gatha Mayer, MD       aspirin chewable tablet 81 mg  81 mg Oral Daily Gatha Mayer, MD   81 mg at 07/01/20 1252   Chlorhexidine Gluconate Cloth 2 % PADS 6 each  6 each Topical Q0600 Gatha Mayer, MD   6 each at 07/01/20 1252   cloNIDine (CATAPRES - Dosed in mg/24 hr) patch 0.1 mg  0.1 mg Transdermal Weekly Gatha Mayer, MD   0.1 mg at 06/29/20 2303   clopidogrel (PLAVIX) tablet 75 mg  75 mg Oral Q breakfast Gatha Mayer, MD   75 mg at 07/01/20 1252   cyclobenzaprine (FLEXERIL) tablet 10 mg  10 mg Oral TID Gatha Mayer, MD   10 mg at 07/01/20 1251   fluticasone furoate-vilanterol (BREO ELLIPTA) 200-25 MCG/INH 1 puff  1 puff Inhalation Daily Gatha Mayer, MD   1 puff at 06/30/20 0744   heparin injection 1,000 Units  1,000 Units Dialysis PRN Gatha Mayer, MD       heparin injection 5,000 Units  5,000 Units Subcutaneous Q8H Gatha Mayer, MD   5,000 Units at 07/01/20 1252   hydrALAZINE (APRESOLINE) tablet 50 mg  50 mg Oral Q8H Gatha Mayer, MD   50 mg at 07/01/20 1251   insulin aspart (novoLOG)  injection 0-6 Units  0-6 Units  Subcutaneous TID WC Gatha Mayer, MD       [START ON 07/03/2020] levofloxacin [facility]) tablet 500 mg  500 mg Oral Once Donnamae Jude, RPH       levofloxacin [facility]) tablet 750 mg  750 mg Oral Once Donnamae Jude, RPH       lidocaine (PF) (XYLOCAINE) 1 % injection 5 mL  5 mL Intradermal PRN Gatha Mayer, MD       lidocaine-prilocaine (EMLA) cream 1 application  1 application Topical PRN Gatha Mayer, MD       metoCLOPramide (REGLAN) injection 10 mg  10 mg Intravenous Q8H Gatha Mayer, MD   10 mg at 07/01/20 1252   montelukast (SINGULAIR) tablet 10 mg  10 mg Oral QHS Gatha Mayer, MD       pantoprazole (PROTONIX) EC tablet 40 mg  40 mg Oral Q supper Gatha Mayer, MD   40 mg at 06/30/20 1607   pentafluoroprop-tetrafluoroeth (GEBAUERS) aerosol 1 application  1 application Topical PRN Gatha Mayer, MD       Labs: Basic Metabolic Panel: Recent Labs  Lab 06/29/20 1310 07/01/20 0832  NA 138 139  K 3.7 4.0  CL 93* 94*  CO2 31 26  GLUCOSE 97 81  BUN 26* 46*  CREATININE 6.09* 8.62*  CALCIUM 9.6 9.2  PHOS  --  7.2*   Liver Function Tests: Recent Labs  Lab 06/29/20 1310  AST 21  ALT 24  ALKPHOS 101  BILITOT 1.0  PROT 7.1  ALBUMIN 3.3*   Recent Labs  Lab 06/29/20 1415  LIPASE 25   No results for input(s): AMMONIA in the last 168 hours. CBC: Recent Labs  Lab 06/29/20 1310 07/01/20 0832  WBC 5.5 5.9  NEUTROABS 3.9  --   HGB 10.6* 9.7*  HCT 36.2 32.2*  MCV 81.9 79.1*  PLT 212 224   Cardiac Enzymes: No results for input(s): CKTOTAL, CKMB, CKMBINDEX, TROPONINI in the last 168 hours. CBG: Recent Labs  Lab 06/29/20 2157  GLUCAP 106*   Iron Studies: No results for input(s): IRON, TIBC, TRANSFERRIN, FERRITIN in the last 72 hours. Studies/Results: DG ESOPHAGUS W SINGLE CM (SOL OR THIN BA)  Result Date: 06/30/2020 CLINICAL DATA:  Progressive dysphagia, nausea and vomiting EXAM: ESOPHOGRAM/BARIUM SWALLOW TECHNIQUE: Single contrast examination was  performed using  thin barium. FLUOROSCOPY TIME:  Fluoroscopy Time:  2 minutes 48 seconds Radiation Exposure Index (if provided by the fluoroscopic device): 40.8 mGy Number of Acquired Spot Images: 0 COMPARISON:  None. FINDINGS: The pharyngeal phase of swallowing was normal. Moderate esophageal dysmotility with splitting of the contrast bolus and some delayed distal progression of contrast. Normal esophageal course and contour. No evidence esophageal stricture or ulceration. No hiatal hernia. No gastroesophageal reflux occurred spontaneously or was elicited. A 13 mm barium tablet passed without difficulty into the stomach. IMPRESSION: Moderate esophageal dysmotility. No hiatal hernia or gastroesophageal reflux elicited. A 13 mm barium tablet passed without difficulty into the stomach. Electronically Signed   By: Maurine Simmering   On: 06/30/2020 14:54     Physical Exam: Vitals:   07/01/20 1530 07/01/20 1600 07/01/20 1630 07/01/20 1700  BP: (!) 174/77 (!) 180/88 (!) 156/70 (!) 150/70  Pulse: 98 (!) 119 98 98  Resp: 18 18 18 18   Temp:      TempSrc:      SpO2:      Weight:      Height:  Physical Exam General: WDWN NAD Head: NCAT sclera not icteric MMM Neck: Supple. No lymphadenopathy Lungs: CTA bilaterally. No wheeze, rales or rhonchi. Breathing is unlabored. Heart: RRR. No murmur, rubs or gallops.  Abdomen: soft, nontender, +BS, no guarding, no rebound tenderness M/S:  Equal strength b/l in upper and lower extremities.  Lower extremities:no edema, ischemic changes, or open wounds  Neuro: AAOx3. Moves all extremities spontaneously. Psych:  Responds to questions appropriately with a normal affect. Dialysis Access:  Dialysis Orders:  [facility] - [facility]  3hrs/16min, BFR 350, DFR 600,  EDW 64.5kg, 2K/ 2Ca  Access: L AVF  Mircera 200 mcg q2wks - last 06/17/2020 Hectorol 7mcg IV qHD -last dose 06/28/20 Home meds: Renvela 2.4GM oral powder 1 packet TID with meals  Last Labs:  Hgb 9.7, K 4.0, Ca 9.2, P 7.2  Assessment/Plan:  Intractable nausea with vomiting and dysphagia: possible gastroparesis flare?-Followed by GI-underwent EGD today-overall results unremarkable. Per GI: taper Reglan to PRN   ESRD - on HD TTS-received scheduled HD today after EGD-appears that she ran full treatment.   Hypertension/volume  - BP trend elevated- patient having difficulty with swallowing causing her not to be able to take all her BP meds. Patient was euvolemic on exam-post BP appears to be improved after HD-of note, IV Hydralazine given today-continue Hydralazine PO TID and clonidine patch.   Anemia of CKD - Hgb 9.7-on Micera in OP-will start Aranesp 6/23  Secondary Hyperparathyroidism - Ca ok, PO4 high-will resume OP Renvela powder form.  Nutrition - Renal diet with fluid restriction. Albumin 3.3-will start protein supplements.  Tobie Poet, NP [facility] 07/01/2020, 5:27 PM

## 2020-07-01 NOTE — Anesthesia Procedure Notes (Signed)
Procedure Name: MAC Date/Time: 07/01/2020 11:47 AM Performed by: Mariea Clonts, CRNA Pre-anesthesia Checklist: Patient identified, Emergency Drugs available, Suction available, Patient being monitored and Timeout performed Patient Re-evaluated:Patient Re-evaluated prior to induction Oxygen Delivery Method: Nasal cannula

## 2020-07-01 NOTE — Interval H&P Note (Signed)
History and Physical Interval Note:  07/01/2020 11:41 AM  Susan Horton  has presented today for surgery, with the diagnosis of Epigastric pain.  The various methods of treatment have been discussed with the patient and family. After consideration of risks, benefits and other options for treatment, the patient has consented to  Procedure(s): ESOPHAGOGASTRODUODENOSCOPY (EGD) WITH PROPOFOL (N/A) as a surgical intervention.  The patient's history has been reviewed, patient examined, no change in status, stable for surgery.  I have reviewed the patient's chart and labs.  Questions were answered to the patient's satisfaction.     Silvano Rusk

## 2020-07-02 ENCOUNTER — Encounter (HOSPITAL_COMMUNITY): Payer: Self-pay | Admitting: *Deleted

## 2020-07-02 ENCOUNTER — Other Ambulatory Visit (HOSPITAL_COMMUNITY): Payer: Self-pay

## 2020-07-02 MED ORDER — AEROCHAMBER PLUS FLO-VU MISC
1.0000 | Freq: Once | Status: AC
  Administered 2020-07-02: 1
  Filled 2020-07-02: qty 1

## 2020-07-02 MED ORDER — METOCLOPRAMIDE HCL 5 MG PO TABS
ORAL_TABLET | ORAL | 0 refills | Status: DC
  Filled 2020-07-02: qty 60, 13d supply, fill #0

## 2020-07-02 MED ORDER — ALBUTEROL SULFATE HFA 108 (90 BASE) MCG/ACT IN AERS
1.0000 | INHALATION_SPRAY | Freq: Four times a day (QID) | RESPIRATORY_TRACT | 2 refills | Status: AC | PRN
  Filled 2020-07-02: qty 18, 30d supply, fill #0

## 2020-07-02 MED ORDER — ONDANSETRON 4 MG PO TBDP
4.0000 mg | ORAL_TABLET | Freq: Three times a day (TID) | ORAL | 0 refills | Status: DC | PRN
  Filled 2020-07-02: qty 20, 7d supply, fill #0

## 2020-07-02 MED ORDER — LEVOFLOXACIN 500 MG PO TABS
500.0000 mg | ORAL_TABLET | Freq: Once | ORAL | 0 refills | Status: AC
  Filled 2020-07-02: qty 1, 1d supply, fill #0

## 2020-07-02 MED ORDER — PANTOPRAZOLE SODIUM 40 MG PO TBEC
40.0000 mg | DELAYED_RELEASE_TABLET | Freq: Every day | ORAL | 0 refills | Status: DC
  Filled 2020-07-02: qty 30, 30d supply, fill #0

## 2020-07-02 MED ORDER — IPRATROPIUM-ALBUTEROL 0.5-2.5 (3) MG/3ML IN SOLN
3.0000 mL | Freq: Four times a day (QID) | RESPIRATORY_TRACT | 2 refills | Status: AC | PRN
  Filled 2020-07-02: qty 90, 8d supply, fill #0

## 2020-07-02 MED ORDER — SODIUM CHLORIDE 0.9 % IV SOLN: 100.0000 mL | INTRAVENOUS | Status: DC | PRN

## 2020-07-02 MED ORDER — AMLODIPINE BESYLATE 10 MG PO TABS
10.0000 mg | ORAL_TABLET | Freq: Every day | ORAL | Status: DC
  Administered 2020-07-02: 10 mg via ORAL
  Filled 2020-07-02: qty 1

## 2020-07-02 MED ORDER — CYCLOBENZAPRINE HCL 10 MG PO TABS
ORAL_TABLET | ORAL | 0 refills | Status: AC
  Filled 2020-07-02: qty 30, 14d supply, fill #0

## 2020-07-02 NOTE — Care Management Important Message (Signed)
Important Message  Patient Details  Name: Susan Horton MRN: [id number] Date of Birth: Aug 14, 1970   Medicare Important Message Given:  Yes     Georgann Bramble [name] 07/02/2020, 3:56 PM

## 2020-07-02 NOTE — Discharge Instructions (Signed)
Susan Horton,  You were in the hospital with nausea/vomiting. This may be related to gastroparesis. Your upper endoscopy did not reveal any reason for your symptoms. Please continue Reglan scheduled for the next week and then take as needed. I have added Zofran as needed to help if the Reglan alone is not helping. You were also found to have a pneumonia and will be discharged on an antibiotic, Levaquin; please take this on 6/23. Lastly, you had some wheezing and I have provided you with prescriptions for Albuterol and Duonebs to help with your symptoms. Please follow-up with your PCP and gastroenterologist.

## 2020-07-02 NOTE — Progress Notes (Signed)
[facility] Progress Note   Subjective:     Patient seen and examined today at bedside. She had HD yesterday but only stayed on for 2 1/2 hours-she wanted to come off even after advisement to complete full treatment-UF of 1L. Her breathing much improved. She reports the breathing treatments are helping. Denies SOB, CP, palpitations, ABD pain, N/V/D. Patient is wanting to go home. Plan for HD 6/23 if patient is still here. Okay for discharge from renal standpoint.   Objective Vitals:   07/01/20 1814 07/01/20 2013 07/02/20 0807 07/02/20 0840  BP: (!) 162/56 (!) 159/60 (!) 193/94   Pulse: (!) 101 99 (!) 102   Resp: 20 18 20    Temp: 97.6 F (36.4 C) 98.9 F (37.2 C) 98.7 F (37.1 C)   TempSrc: Oral Oral Oral   SpO2: 100% 100% 98% 99%  Weight:      Height:       Physical Exam General:Well appearing female; No acute respiratory distress Heart: Normal S1 and S2; No murmurs, gallops, or rubs Lungs: Clear throughout; No wheezing, rales, or rhonchi Abdomen: Soft, non-tender Extremities:No edema bilateral lower extremities Dialysis Access: L AVF   Filed Weights   06/29/20 1308 07/01/20 1450 07/01/20 1730  Weight: 68 kg 64.6 kg 63.7 kg    Intake/Output Summary (Last 24 hours) at 07/02/2020 1037 Last data filed at 07/02/2020 0500 Gross per 24 hour  Intake 100 ml  Output 1000 ml  Net -900 ml    Additional Objective Labs: Basic Metabolic Panel: Recent Labs  Lab 06/29/20 1310 07/01/20 0832  NA 138 139  K 3.7 4.0  CL 93* 94*  CO2 31 26  GLUCOSE 97 81  BUN 26* 46*  CREATININE 6.09* 8.62*  CALCIUM 9.6 9.2  PHOS  --  7.2*   Liver Function Tests: Recent Labs  Lab 06/29/20 1310  AST 21  ALT 24  ALKPHOS 101  BILITOT 1.0  PROT 7.1  ALBUMIN 3.3*   Recent Labs  Lab 06/29/20 1415  LIPASE 25   CBC: Recent Labs  Lab 06/29/20 1310 07/01/20 0832  WBC 5.5 5.9  NEUTROABS 3.9  --   HGB 10.6* 9.7*  HCT 36.2 32.2*  MCV 81.9 79.1*  PLT 212 224   Blood  Culture    Component Value Date/Time   SDES FLUID PERITONEAL 03/01/2019 0735   SDES FLUID PERITONEAL 03/01/2019 0735   SPECREQUEST BOTTLES DRAWN AEROBIC AND ANAEROBIC 03/01/2019 0735   SPECREQUEST NONE 03/01/2019 0735   CULT  03/01/2019 0735    NO GROWTH 5 DAYS Performed at [facility], [place] [address] [place], [place] [address]    REPTSTATUS 03/06/2019 FINAL 03/01/2019 0735   REPTSTATUS 03/02/2019 FINAL 03/01/2019 0735    Cardiac Enzymes: No results for input(s): CKTOTAL, CKMB, CKMBINDEX, TROPONINI in the last 168 hours. CBG: Recent Labs  Lab 06/29/20 2157  GLUCAP 106*   Iron Studies: No results for input(s): IRON, TIBC, TRANSFERRIN, FERRITIN in the last 72 hours. Lab Results  Component Value Date   INR 1.1 01/09/2019   INR 1.2 01/01/2019   INR 0.91 02/01/2017   Studies/Results: DG ESOPHAGUS W SINGLE CM (SOL OR THIN BA)  Result Date: 06/30/2020 CLINICAL DATA:  Progressive dysphagia, nausea and vomiting EXAM: ESOPHOGRAM/BARIUM SWALLOW TECHNIQUE: Single contrast examination was performed using  thin barium. FLUOROSCOPY TIME:  Fluoroscopy Time:  2 minutes 48 seconds Radiation Exposure Index (if provided by the fluoroscopic device): 40.8 mGy Number of Acquired Spot Images: 0 COMPARISON:  None.  FINDINGS: The pharyngeal phase of swallowing was normal. Moderate esophageal dysmotility with splitting of the contrast bolus and some delayed distal progression of contrast. Normal esophageal course and contour. No evidence esophageal stricture or ulceration. No hiatal hernia. No gastroesophageal reflux occurred spontaneously or was elicited. A 13 mm barium tablet passed without difficulty into the stomach. IMPRESSION: Moderate esophageal dysmotility. No hiatal hernia or gastroesophageal reflux elicited. A 13 mm barium tablet passed without difficulty into the stomach. Electronically Signed   By: Maurine Simmering   On: 06/30/2020 14:54    Medications:  sodium chloride     sodium chloride       aerochamber plus with mask  1 each Other Once   amLODipine  10 mg Oral Daily   aspirin  81 mg Oral Daily   Chlorhexidine Gluconate Cloth  6 each Topical Q0600   cloNIDine  0.1 mg Transdermal Weekly   clopidogrel  75 mg Oral Q breakfast   cyclobenzaprine  10 mg Oral TID   fluticasone furoate-vilanterol  1 puff Inhalation Daily   heparin  5,000 Units Subcutaneous Q8H   hydrALAZINE  50 mg Oral Q8H   insulin aspart  0-6 Units Subcutaneous TID WC   [START ON 07/03/2020] levofloxacin  500 mg Oral Once   metoCLOPramide (REGLAN) injection  10 mg Intravenous Q8H   montelukast  10 mg Oral QHS   pantoprazole  40 mg Oral Q supper    Dialysis Orders: HD TTS - [facility]  3hrs/70min, BFR 350, DFR 600,  EDW 64.5kg, 2K/ 2Ca  Assessment/Plan: Intractable nausea with vomiting and dysphagia: possible gastroparesis flare?-underwent EGD today-overall results unremarkable. Per GI: taper Reglan to PRN-appears that GI has signed off  ESRD - on HD TTS-received scheduled HD today after EGD-patient did not complete full treatment yesterday-requested to come off even after advisement to stay full time-plan for HD 6/23 if patient still here  Hypertension/volume  - BP trend elevated- patient having difficulty with swallowing causing her not to be able to take all her BP meds. Patient was euvolemic on exam-post BP appears to be improved after HD-of note, IV Hydralazine given 6/21-continue Hydralazine PO TID and clonidine patch.  Anemia of CKD - Hgb 9.7-on Micera in OP-will start Aranesp 6/23 Secondary Hyperparathyroidism - Ca ok, PO4 7.2-having difficulty keeping binders down d/t vomiting-patient possible discharge today-will f/u on this outpatient  Nutrition -Renal diet with fluid restriction. Albumin 3.3-will f/u outpatient Disposition: Okay for discharge from renal standpoint.  Tobie Poet, NP [facility] 07/02/2020,10:37 AM  LOS: 3 days

## 2020-07-02 NOTE — Discharge Summary (Signed)
Physician Discharge Summary  SIMORA DINGEE ERX:[id number] DOB: Mar 30, 1970 DOA: 06/29/2020  PCP: Charlott Rakes, MD  Admit date: 06/29/2020 Discharge date: 07/02/2020  Admitted From: Home Disposition: Home  Recommendations for Outpatient Follow-up:  Follow up with PCP in 1 week Follow up with GI as an outpatient Please follow up on the following pending results: None  Discharge Condition: Stable CODE STATUS: Full code Diet recommendation: Soft diet   Brief/Interim Summary:  Admission HPI written by Lequita Halt, MD   HPI: Susan Horton is a 50 y.o. female with medical history significant of diabetic gastroparesis status post Botox injection, IDDM, ESRD on HD TTS, HTN, HLD, CAD with stenting, presented with intractable nauseous vomit.   Patient has had chronic gastroparesis with frequent flareup, she has been on Reglan and as needed Zofran, and recently she underwent EGD, and Botox injections on the pylorus on June 2.  Since the procedure, patient has experienced worsening of dysphagia, "all the food, no matter it is a candy or meal" [place] getting worse, she could only tolerate liquid diet for last 2 days however still feeling quite nauseous and vomited several times with stomach content.  Meantime she developed dry cough and shortness of breath.  She underwent hemodialysis yesterday and no improvement of breathing symptoms after dialysis.  No fever chills or chest pain or abdominal pains.  She has chronic constipated and no diarrhea.  Unable to take any of her blood pressure pills or other pills since yesterday her blood pressure significant elevated.   Hospital course:  Intractable nausea and vomiting Dysphagia Patient initially NPO and managed on Zofran and Reglan. GI was consulted. Patient with noted dysmotility on barium swallow but per GI, most problems started after prior EGD with pyloric dilation/botox. Repeat EGD performed and significant for normal esophagus/stomach.  Recommendation to continue scheduled Reglan than wean to as needed. Patient to follow-up with outpatient gastroenterologist.  Possible aspiration pneumonia Unclear but covering empirically with Levaquin. Received two dose while admitted and discharged with one more dose to complete a 6 day, renal adjusted course.  Primary hypertension Continue home amlodipine, Imdur and metoprolol  ESRD on HD Continue HD  CAD No chest pain. Asymptomatic. Stable.  Diabetic gastroparesis Reglan as mentioned above.  Diabetes mellitus, insulin dependent Continue home insulin regimen.  Discharge Diagnoses:  Active Problems:   Intractable nausea and vomiting   Abdominal pain, epigastric   Dysphagia    Discharge Instructions   Allergies as of 07/02/2020       Reactions   Penicillins Anaphylaxis, Itching, Swelling, Rash, Other (See Comments)   Swelling of throat & whole mouth  Has patient had a PCN reaction causing immediate rash, facial/tongue/throat swelling, SOB or lightheadedness with hypotension: Yes Has patient had a PCN reaction causing severe rash involving mucus membranes or skin necrosis: No Has patient had a PCN reaction that required hospitalization: No Has patient had a PCN reaction occurring within the last 10 years: No If all of the above answers are "NO", then may proceed with Cephalosporin use.   Repatha [evolocumab] Itching   Lisinopril Cough        Medication List     STOP taking these medications    fluconazole 150 MG tablet Commonly known as: DIFLUCAN   insulin aspart 100 UNIT/ML injection Commonly known as: novoLOG   methocarbamol 500 MG tablet Commonly known as: Robaxin   Toujeo Max SoloStar 300 UNIT/ML Solostar Pen Generic drug: insulin glargine (2 Unit Dial)  TAKE these medications    albuterol 108 (90 Base) MCG/ACT inhaler Commonly known as: VENTOLIN HFA Inhale 1-2 puffs into the lungs every 6 (six) hours as needed for wheezing or shortness  of breath.   amLODipine 10 MG tablet Commonly known as: NORVASC Take 1 tablet (10 mg total) by mouth daily.   aspirin 81 MG chewable tablet Chew 1 tablet (81 mg total) by mouth daily.   atorvastatin 40 MG tablet Commonly known as: LIPITOR TAKE 1 TABLET BY MOUTH EVERY DAY   calcitRIOL 0.5 MCG capsule Commonly known as: ROCALTROL Take 1.5 mcg by mouth daily.   calcium acetate 667 MG capsule Commonly known as: PHOSLO Take 667 mg by mouth 3 (three) times daily with meals.   clopidogrel 75 MG tablet Commonly known as: PLAVIX Take 1 tablet (75 mg total) by mouth daily with breakfast.   cyclobenzaprine 10 MG tablet Commonly known as: FLEXERIL Take 1 tablet (10 mg total) by mouth 3 (three) times daily for 7 days, THEN 1 tablet (10 mg total) 3 (three) times daily as needed for up to 7 days. Start taking on: July 02, 2020   Dialyvite/Zinc Tabs Take 1 tablet by mouth daily.   doxercalciferol 4 MCG/2ML injection Commonly known as: HECTOROL Doxercalciferol (Hectorol)   Fluticasone-Salmeterol 250-50 MCG/DOSE Aepb Commonly known as: ADVAIR Inhale 1 puff into the lungs 2 (two) times daily. What changed:  when to take this reasons to take this   heparin 1000 unit/mL Soln injection Heparin Sodium (Porcine) 1,000 Units/mL Systemic   hydrALAZINE 50 MG tablet Commonly known as: APRESOLINE Take 50 mg by mouth every 8 (eight) hours. What changed: Another medication with the same name was removed. Continue taking this medication, and follow the directions you see here.   ipratropium-albuterol 0.5-2.5 (3) MG/3ML Soln Commonly known as: DUONEB Take 3 mLs by nebulization every 6 (six) hours as needed.   iron sucrose 20 MG/ML injection Commonly known as: VENOFER Iron Sucrose (Venofer)   isosorbide mononitrate 30 MG 24 hr tablet Commonly known as: IMDUR Take 0.5 tablets (15 mg total) by mouth daily.   levofloxacin 500 MG tablet Commonly known as: LEVAQUIN Take 1 tablet (500 mg  total) by mouth once for 1 dose. Start taking on: July 03, 2020   metoCLOPramide 5 MG tablet Commonly known as: REGLAN Take 2 tablets (10 mg total) by mouth 3 (three) times daily before meals for 7 days, THEN 1 tablet (5 mg total) every 8 (eight) hours as needed for nausea or vomiting. Start taking on: July 02, 2020 What changed: See the new instructions.   metoprolol succinate 50 MG 24 hr tablet Commonly known as: TOPROL-XL Take 75 mg (1.5 tablet) daily What changed:  how much to take how to take this when to take this additional instructions   MIRCERA IJ Inject into the skin.   montelukast 10 MG tablet Commonly known as: SINGULAIR Take 1 tablet (10 mg total) by mouth at bedtime.   nitroGLYCERIN 0.4 MG SL tablet Commonly known as: Nitrostat Place 1 tablet (0.4 mg total) under the tongue every 5 (five) minutes as needed for chest pain.   ondansetron 4 MG disintegrating tablet Commonly known as: Zofran ODT Take 1 tablet (4 mg total) by mouth every 8 (eight) hours as needed for nausea or vomiting.   pantoprazole 40 MG tablet Commonly known as: PROTONIX Take 1 tablet (40 mg total) by mouth daily.   Praluent 75 MG/ML Soaj Generic drug: Alirocumab INJECT 75 MG INTO THE SKIN  EVERY 14 (FOURTEEN) DAYS.   sucralfate 1 GM/10ML suspension Commonly known as: Carafate Take 10 mLs (1 g total) by mouth 4 (four) times daily -  with meals and at bedtime.   trimethoprim-polymyxin b ophthalmic solution Commonly known as: POLYTRIM Place 1 drop into both eyes See admin instructions. Instill one drop into both eyes four times daily for 2 days after each monthly eye injection       ASK your doctor about these medications    amitriptyline 10 MG tablet Commonly known as: ELAVIL Take 1 tablet (10 mg total) by mouth at bedtime.   lidocaine-prilocaine cream Commonly known as: EMLA Apply 1 application topically as needed.        Follow-up Information     [facility] on 08/06/2020.   Why: hospital follow up with PCP/ Dr. Margarita Rana  at 9:30 am on 08/06/2020 Contact information: [place] [id number] [phone]        Scherrie November, MD. Schedule an appointment as soon as possible for a visit in 2 week(s).   Specialty: Internal Medicine Why: For hospital follow-up Contact information: [address] [place] [address] [phone]                Allergies  Allergen Reactions   Penicillins Anaphylaxis, Itching, Swelling, Rash and Other (See Comments)    Swelling of throat & whole mouth  Has patient had a PCN reaction causing immediate rash, facial/tongue/throat swelling, SOB or lightheadedness with hypotension: Yes Has patient had a PCN reaction causing severe rash involving mucus membranes or skin necrosis: No Has patient had a PCN reaction that required hospitalization: No Has patient had a PCN reaction occurring within the last 10 years: No If all of the above answers are "NO", then may proceed with Cephalosporin use.   Repatha [Evolocumab] Itching   Lisinopril Cough    Consultations: Gastroenterology   Procedures/Studies: CT Abdomen Pelvis Wo Contrast  Result Date: 06/29/2020 CLINICAL DATA:  Suspected bowel obstruction. EXAM: CT ABDOMEN AND PELVIS WITHOUT CONTRAST TECHNIQUE: Multidetector CT imaging of the abdomen and pelvis was performed following the standard protocol without IV contrast. COMPARISON:  February 24, 2019 FINDINGS: Lower chest: Small right pleural effusion. Calcific atherosclerotic disease of the aorta and coronary arteries. Mild peripheral ground-glass opacities in the lung bases. Hepatobiliary: No focal liver abnormality is seen. Status post cholecystectomy. No biliary dilatation. Pancreas: Unremarkable. No pancreatic ductal dilatation or surrounding inflammatory changes. Spleen: Normal in size without focal abnormality. Adrenals/Urinary  Tract: Adrenal glands are unremarkable. Kidneys are normal, without renal calculi, focal lesion, or hydronephrosis. Bladder is unremarkable. Stomach/Bowel: Stomach is within normal limits. Appendix appears normal. No evidence of bowel wall thickening, distention, or inflammatory changes. Vascular/Lymphatic: Aortic atherosclerosis. No enlarged abdominal or pelvic lymph nodes. Reproductive: Status post hysterectomy. No adnexal masses. Other: Mesenteric and retroperitoneal fat stranding and small amount of free retroperitoneal fluid, mostly right-sided. Musculoskeletal: No acute or significant osseous findings. Coarse calcifications adjacent to the left greater trochanter, likely dystrophic. Soft tissue thickening and small amount of gas likely represent right sacral wound. No evidence of involvement of the underlying sacrum. IMPRESSION: 1. No evidence of small-bowel obstruction. 2. Mesenteric and retroperitoneal fat stranding and small amount of free retroperitoneal fluid, mostly right-sided. 3. Small right pleural effusion. 4. Mild peripheral ground-glass opacities in the lung bases, which may represent mild pulmonary edema or atypical consolidation. 5. Calcific atherosclerotic disease of the aorta and coronary arteries.  6. Right sacral wound. Aortic Atherosclerosis (ICD10-I70.0). Electronically Signed   By: Fidela Salisbury M.D.   On: 06/29/2020 15:27   DG Chest 2 View  Result Date: 06/29/2020 CLINICAL DATA:  Shortness of breath, chest pain. EXAM: CHEST - 2 VIEW COMPARISON:  Prior chest radiographs 03/22/2020 and earlier. FINDINGS: Heart size at the upper limits of normal. Aortic atherosclerosis. Ill-defined opacity within the left lung base. Unchanged nonspecific prominence of the minor fissure. No evidence of pleural effusion or pneumothorax. No acute bony abnormality identified. Surgical clips within the right upper quadrant of the abdomen. IMPRESSION: Ill-defined opacity within the left lung base,  suspicious for pneumonia given the provided history. Followup PA and lateral chest radiographs are recommended in 3-4 weeks following a trial of antibiotic therapy to ensure resolution and exclude alternative etiologies (i.e. underlying malignancy). Aortic Atherosclerosis (ICD10-I70.0). Electronically Signed   By: Kellie Simmering DO   On: 06/29/2020 13:40   DG ESOPHAGUS W SINGLE CM (SOL OR THIN BA)  Result Date: 06/30/2020 CLINICAL DATA:  Progressive dysphagia, nausea and vomiting EXAM: ESOPHOGRAM/BARIUM SWALLOW TECHNIQUE: Single contrast examination was performed using  thin barium. FLUOROSCOPY TIME:  Fluoroscopy Time:  2 minutes 48 seconds Radiation Exposure Index (if provided by the fluoroscopic device): 40.8 mGy Number of Acquired Spot Images: 0 COMPARISON:  None. FINDINGS: The pharyngeal phase of swallowing was normal. Moderate esophageal dysmotility with splitting of the contrast bolus and some delayed distal progression of contrast. Normal esophageal course and contour. No evidence esophageal stricture or ulceration. No hiatal hernia. No gastroesophageal reflux occurred spontaneously or was elicited. A 13 mm barium tablet passed without difficulty into the stomach. IMPRESSION: Moderate esophageal dysmotility. No hiatal hernia or gastroesophageal reflux elicited. A 13 mm barium tablet passed without difficulty into the stomach. Electronically Signed   By: Maurine Simmering   On: 06/30/2020 14:54      Subjective: Feels much better today.  Discharge Exam: Vitals:   07/02/20 0807 07/02/20 0840  BP: (!) 193/94   Pulse: (!) 102   Resp: 20   Temp: 98.7 F (37.1 C)   SpO2: 98% 99%   Vitals:   07/01/20 1814 07/01/20 2013 07/02/20 0807 07/02/20 0840  BP: (!) 162/56 (!) 159/60 (!) 193/94   Pulse: (!) 101 99 (!) 102   Resp: 20 18 20    Temp: 97.6 F (36.4 C) 98.9 F (37.2 C) 98.7 F (37.1 C)   TempSrc: Oral Oral Oral   SpO2: 100% 100% 98% 99%  Weight:      Height:        General: Pt is alert,  awake, not in acute distress Cardiovascular: RRR, S1/S2 +, no rubs, no gallops. 2/6 systolic murmur Respiratory: CTA bilaterally, no wheezing, no rhonchi Abdominal: Soft, NT, ND, bowel sounds + Extremities: no edema, no cyanosis    The results of significant diagnostics from this hospitalization (including imaging, microbiology, ancillary and laboratory) are listed below for reference.     Microbiology: Recent Results (from the past 240 hour(s))  Resp Panel by RT-PCR (Flu A&B, Covid) Nasopharyngeal Swab     Status: None   Collection Time: 06/29/20  4:38 PM   Specimen: Nasopharyngeal Swab; Nasopharyngeal(NP) swabs in vial transport medium  Result Value Ref Range Status   SARS Coronavirus 2 by RT PCR NEGATIVE NEGATIVE Final    Comment: (NOTE) SARS-CoV-2 target nucleic acids are NOT DETECTED.  The SARS-CoV-2 RNA is generally detectable in upper respiratory specimens during the acute phase of infection. The lowest concentration of SARS-CoV-2  viral copies this assay can detect is 138 copies/mL. A negative result does not preclude SARS-Cov-2 infection and should not be used as the sole basis for treatment or other patient management decisions. A negative result may occur with  improper specimen collection/handling, submission of specimen other than nasopharyngeal swab, presence of viral mutation(s) within the areas targeted by this assay, and inadequate number of viral copies(<138 copies/mL). A negative result must be combined with clinical observations, patient history, and epidemiological information. The expected result is Negative.  Fact Sheet for Patients:  EntrepreneurPulse.com.au  Fact Sheet for Healthcare Providers:  IncredibleEmployment.be  This test is no t yet approved or cleared by the [place] FDA and  has been authorized for detection and/or diagnosis of SARS-CoV-2 by FDA under an Emergency Use Authorization (EUA). This EUA  will remain  in effect (meaning this test can be used) for the duration of the COVID-19 declaration under Section 564(b)(1) of the Act, 21 U.S.C.section 360bbb-3(b)(1), unless the authorization is terminated  or revoked sooner.       Influenza A by PCR NEGATIVE NEGATIVE Final   Influenza B by PCR NEGATIVE NEGATIVE Final    Comment: (NOTE) The Xpert Xpress SARS-CoV-2/FLU/RSV plus assay is intended as an aid in the diagnosis of influenza from Nasopharyngeal swab specimens and should not be used as a sole basis for treatment. Nasal washings and aspirates are unacceptable for Xpert Xpress SARS-CoV-2/FLU/RSV testing.  Fact Sheet for Patients: EntrepreneurPulse.com.au  Fact Sheet for Healthcare Providers: IncredibleEmployment.be  This test is not yet approved or cleared by the [place] FDA and has been authorized for detection and/or diagnosis of SARS-CoV-2 by FDA under an Emergency Use Authorization (EUA). This EUA will remain in effect (meaning this test can be used) for the duration of the COVID-19 declaration under Section 564(b)(1) of the Act, 21 U.S.C. section 360bbb-3(b)(1), unless the authorization is terminated or revoked.  Performed at [facility], [place] [address], [place],  [address]      Labs: BNP (last 3 results) Recent Labs    06/29/20 1620  BNP 8,338.2*   Basic Metabolic Panel: Recent Labs  Lab 06/29/20 1310 07/01/20 0832  NA 138 139  K 3.7 4.0  CL 93* 94*  CO2 31 26  GLUCOSE 97 81  BUN 26* 46*  CREATININE 6.09* 8.62*  CALCIUM 9.6 9.2  MG  --  2.1  PHOS  --  7.2*   Liver Function Tests: Recent Labs  Lab 06/29/20 1310  AST 21  ALT 24  ALKPHOS 101  BILITOT 1.0  PROT 7.1  ALBUMIN 3.3*   Recent Labs  Lab 06/29/20 1415  LIPASE 25   No results for input(s): AMMONIA in the last 168 hours. CBC: Recent Labs  Lab 06/29/20 1310 07/01/20 0832  WBC 5.5 5.9  NEUTROABS 3.9  --   HGB 10.6*  9.7*  HCT 36.2 32.2*  MCV 81.9 79.1*  PLT 212 224   Cardiac Enzymes: No results for input(s): CKTOTAL, CKMB, CKMBINDEX, TROPONINI in the last 168 hours. BNP: Invalid input(s): POCBNP CBG: Recent Labs  Lab 06/29/20 2157  GLUCAP 106*   D-Dimer No results for input(s): DDIMER in the last 72 hours. Hgb A1c Recent Labs    06/30/20 0246  HGBA1C 4.9   Lipid Profile No results for input(s): CHOL, HDL, LDLCALC, TRIG, CHOLHDL, LDLDIRECT in the last 72 hours. Thyroid function studies No results for input(s): TSH, T4TOTAL, T3FREE, THYROIDAB in the last 72 hours.  Invalid input(s): FREET3 Anemia  work up No results for input(s): VITAMINB12, FOLATE, FERRITIN, TIBC, IRON, RETICCTPCT in the last 72 hours. Urinalysis    Component Value Date/Time   COLORURINE YELLOW 02/25/2019 0020   APPEARANCEUR CLOUDY (A) 02/25/2019 0020   LABSPEC 1.012 02/25/2019 0020   PHURINE 8.0 02/25/2019 0020   GLUCOSEU 50 (A) 02/25/2019 0020   HGBUR SMALL (A) 02/25/2019 0020   BILIRUBINUR NEGATIVE 02/25/2019 0020   BILIRUBINUR negative 01/13/2016 1135   KETONESUR 20 (A) 02/25/2019 0020   PROTEINUR 100 (A) 02/25/2019 0020   UROBILINOGEN 0.2 01/13/2016 1135   UROBILINOGEN 0.2 06/20/2014 1120   NITRITE NEGATIVE 02/25/2019 0020   LEUKOCYTESUR NEGATIVE 02/25/2019 0020   Sepsis Labs Invalid input(s): PROCALCITONIN,  WBC,  LACTICIDVEN Microbiology Recent Results (from the past 240 hour(s))  Resp Panel by RT-PCR (Flu A&B, Covid) Nasopharyngeal Swab     Status: None   Collection Time: 06/29/20  4:38 PM   Specimen: Nasopharyngeal Swab; Nasopharyngeal(NP) swabs in vial transport medium  Result Value Ref Range Status   SARS Coronavirus 2 by RT PCR NEGATIVE NEGATIVE Final    Comment: (NOTE) SARS-CoV-2 target nucleic acids are NOT DETECTED.  The SARS-CoV-2 RNA is generally detectable in upper respiratory specimens during the acute phase of infection. The lowest concentration of SARS-CoV-2 viral copies this  assay can detect is 138 copies/mL. A negative result does not preclude SARS-Cov-2 infection and should not be used as the sole basis for treatment or other patient management decisions. A negative result may occur with  improper specimen collection/handling, submission of specimen other than nasopharyngeal swab, presence of viral mutation(s) within the areas targeted by this assay, and inadequate number of viral copies(<138 copies/mL). A negative result must be combined with clinical observations, patient history, and epidemiological information. The expected result is Negative.  Fact Sheet for Patients:  EntrepreneurPulse.com.au  Fact Sheet for Healthcare Providers:  IncredibleEmployment.be  This test is no t yet approved or cleared by the [place] FDA and  has been authorized for detection and/or diagnosis of SARS-CoV-2 by FDA under an Emergency Use Authorization (EUA). This EUA will remain  in effect (meaning this test can be used) for the duration of the COVID-19 declaration under Section 564(b)(1) of the Act, 21 U.S.C.section 360bbb-3(b)(1), unless the authorization is terminated  or revoked sooner.       Influenza A by PCR NEGATIVE NEGATIVE Final   Influenza B by PCR NEGATIVE NEGATIVE Final    Comment: (NOTE) The Xpert Xpress SARS-CoV-2/FLU/RSV plus assay is intended as an aid in the diagnosis of influenza from Nasopharyngeal swab specimens and should not be used as a sole basis for treatment. Nasal washings and aspirates are unacceptable for Xpert Xpress SARS-CoV-2/FLU/RSV testing.  Fact Sheet for Patients: EntrepreneurPulse.com.au  Fact Sheet for Healthcare Providers: IncredibleEmployment.be  This test is not yet approved or cleared by the [place] FDA and has been authorized for detection and/or diagnosis of SARS-CoV-2 by FDA under an Emergency Use Authorization (EUA). This EUA will  remain in effect (meaning this test can be used) for the duration of the COVID-19 declaration under Section 564(b)(1) of the Act, 21 U.S.C. section 360bbb-3(b)(1), unless the authorization is terminated or revoked.  Performed at [facility], [place] [address] [place], [place] [address]      Time coordinating discharge: 35 minutes  SIGNED:   Cordelia Poche, MD [facility] 07/02/2020, 9:52 AM

## 2020-07-02 NOTE — Anesthesia Postprocedure Evaluation (Signed)
Anesthesia Post Note  Patient: Susan Horton  Procedure(s) Performed: ESOPHAGOGASTRODUODENOSCOPY (EGD) WITH PROPOFOL     Patient location during evaluation: PACU Anesthesia Type: MAC Level of consciousness: awake and alert Pain management: pain level controlled Vital Signs Assessment: post-procedure vital signs reviewed and stable Respiratory status: spontaneous breathing, nonlabored ventilation, respiratory function stable and patient connected to nasal cannula oxygen Cardiovascular status: stable and blood pressure returned to baseline Postop Assessment: no apparent nausea or vomiting Anesthetic complications: no   No notable events documented.  Last Vitals:  Vitals:   07/02/20 0840 07/02/20 1113  BP:  (!) 175/75  Pulse:    Resp:    Temp:    SpO2: 99%     Last Pain:  Vitals:   07/02/20 1113  TempSrc:   PainSc: 0-No pain                 Sila Sarsfield

## 2020-07-02 NOTE — TOC Transition Note (Signed)
Transition of Care [facility]) - CM/SW Discharge Note   Patient Details  Name: Susan Horton MRN: [id number] Date of Birth: 1970/03/28  [facility] CM/SW Contact:  Sharin Mons, RN Phone Number: 07/02/2020, 1:53 PM   Clinical Narrative:    Patient will DC to: Home Anticipated DC date: 07/02/2020 Family notified: yes Transport by: car  Presented with intractable N/V. Hx of  end-stage renal / HD,diabetes mellitus, hypertension, CAD/stents, s/pcholecystectomy and appendectomy.  Per MD patient ready for DC today. RN, patient, patient's family, and aware of DC. Pt without Rx med concerns.  Pt states will pick DME nebulizer from [place], noted on AVS. Post hospital f/u noted on AVS.  RNCM will sign off for now as intervention is no longer needed. Please consult [name] again if new needs arise.    Final next level of care: Home/Self Care Barriers to Discharge: No Barriers Identified   Patient Goals and CMS Choice     Choice offered to / list presented to : Patient  Discharge Placement                       Discharge Plan and Services                DME Arranged: Nebulizer machine DME Agency: [employer] Date DME Agency Contacted: 07/02/20 Time DME Agency Contacted: 2035 Representative spoke with at DME Agency: [place] ([place]) Interventions     Readmission Risk Interventions No flowsheet data found.

## 2020-07-03 ENCOUNTER — Encounter (HOSPITAL_COMMUNITY): Payer: Self-pay | Admitting: Internal Medicine

## 2020-07-03 ENCOUNTER — Telehealth: Payer: Self-pay

## 2020-07-03 DIAGNOSIS — D631 Anemia in chronic kidney disease: Secondary | ICD-10-CM | POA: Diagnosis not present

## 2020-07-03 DIAGNOSIS — N2581 Secondary hyperparathyroidism of renal origin: Secondary | ICD-10-CM | POA: Diagnosis not present

## 2020-07-03 DIAGNOSIS — Z992 Dependence on renal dialysis: Secondary | ICD-10-CM | POA: Diagnosis not present

## 2020-07-03 DIAGNOSIS — N186 End stage renal disease: Secondary | ICD-10-CM | POA: Diagnosis not present

## 2020-07-03 DIAGNOSIS — Z1211 Encounter for screening for malignant neoplasm of colon: Secondary | ICD-10-CM

## 2020-07-03 DIAGNOSIS — D509 Iron deficiency anemia, unspecified: Secondary | ICD-10-CM | POA: Diagnosis not present

## 2020-07-03 NOTE — Telephone Encounter (Signed)
Call placed to patient and informed her that a referral has been placed to GI for colonoscopy.

## 2020-07-03 NOTE — Telephone Encounter (Signed)
Done

## 2020-07-03 NOTE — Telephone Encounter (Signed)
Transition Care Management Follow-up Telephone Call Date of discharge and from where: 07/02/2020, [facility] How have you been since you were released from the hospital? She was at HD at the time of the call. She said she is feeling better since returning home from the hospital Any questions or concerns? Yes - she is requesting a referral for a colonoscopy. This is needed to be considered for a transplant  Items Reviewed: Did the pt receive and understand the discharge instructions provided? Yes  Medications obtained and verified? Yes  - she said that she has all medications and did not have any questions about her med regime. She said everything was explained very well prior to leaving the hospital  Other? No  Any new allergies since your discharge? No  Dietary orders reviewed? No Do you have support at home? Yes   Home Care and Equipment/Supplies: Were home health services ordered? no If so, what is the name of the agency? N/a  Has the agency set up a time to come to the patient's home? not applicable Were any new equipment or medical supplies ordered?  Yes: nebulizer What is the name of the medical supply agency? [employer] Were you able to get the supplies/equipment? Not yet, the store did not have any and she is now waiting for the delivery of a neb to her house today.  Do you have any questions related to the use of the equipment or supplies? No  Attends HD T/T/S at [facility]  Functional Questionnaire: (I = Independent and D = Dependent) ADLs: independent   Follow up appointments reviewed:  PCP Hospital f/u appt confirmed? Yes  Scheduled to see Dr Margarita Rana  on 08/06/2020.  She did not want to be seen sooner [facility] f/u appt confirmed? Yes  Scheduled to see dermatology- 07/21/2020.  She needs to schedule follow up with Dr Derrill Kay regarding gastroparesis.  Are transportation arrangements needed? No  If their condition worsens, is the pt aware to  call PCP or go to the Emergency Dept.? Yes Was the patient provided with contact information for the PCP's office or ED? Yes Was to pt encouraged to call back with questions or concerns? Yes

## 2020-07-03 NOTE — Telephone Encounter (Signed)
From the discharge call:  She was at HD at the time of the call and said she is feeling better since returning home from the hospital  she is requesting a referral for a colonoscopy. This is needed to be considered for a transplant  she said that she has all medications and did not have any questions about her med regime. She said everything was explained very well prior to leaving the hospital.   Scheduled to see Dr Margarita Rana  on 08/06/2020.  She did not want to be seen sooner

## 2020-07-05 DIAGNOSIS — E1140 Type 2 diabetes mellitus with diabetic neuropathy, unspecified: Secondary | ICD-10-CM | POA: Diagnosis not present

## 2020-07-05 DIAGNOSIS — J90 Pleural effusion, not elsewhere classified: Secondary | ICD-10-CM | POA: Diagnosis not present

## 2020-07-05 DIAGNOSIS — M5030 Other cervical disc degeneration, unspecified cervical region: Secondary | ICD-10-CM | POA: Diagnosis not present

## 2020-07-05 DIAGNOSIS — M47812 Spondylosis without myelopathy or radiculopathy, cervical region: Secondary | ICD-10-CM | POA: Diagnosis not present

## 2020-07-05 DIAGNOSIS — M47814 Spondylosis without myelopathy or radiculopathy, thoracic region: Secondary | ICD-10-CM | POA: Diagnosis not present

## 2020-07-08 DIAGNOSIS — N2581 Secondary hyperparathyroidism of renal origin: Secondary | ICD-10-CM | POA: Diagnosis not present

## 2020-07-08 DIAGNOSIS — D509 Iron deficiency anemia, unspecified: Secondary | ICD-10-CM | POA: Diagnosis not present

## 2020-07-08 DIAGNOSIS — D631 Anemia in chronic kidney disease: Secondary | ICD-10-CM | POA: Diagnosis not present

## 2020-07-08 DIAGNOSIS — N186 End stage renal disease: Secondary | ICD-10-CM | POA: Diagnosis not present

## 2020-07-08 DIAGNOSIS — Z992 Dependence on renal dialysis: Secondary | ICD-10-CM | POA: Diagnosis not present

## 2020-07-10 DIAGNOSIS — D509 Iron deficiency anemia, unspecified: Secondary | ICD-10-CM | POA: Diagnosis not present

## 2020-07-10 DIAGNOSIS — N2581 Secondary hyperparathyroidism of renal origin: Secondary | ICD-10-CM | POA: Diagnosis not present

## 2020-07-10 DIAGNOSIS — N186 End stage renal disease: Secondary | ICD-10-CM | POA: Diagnosis not present

## 2020-07-10 DIAGNOSIS — Z992 Dependence on renal dialysis: Secondary | ICD-10-CM | POA: Diagnosis not present

## 2020-07-10 DIAGNOSIS — D631 Anemia in chronic kidney disease: Secondary | ICD-10-CM | POA: Diagnosis not present

## 2020-07-11 DIAGNOSIS — E1122 Type 2 diabetes mellitus with diabetic chronic kidney disease: Secondary | ICD-10-CM | POA: Diagnosis not present

## 2020-07-11 DIAGNOSIS — N186 End stage renal disease: Secondary | ICD-10-CM | POA: Diagnosis not present

## 2020-07-11 DIAGNOSIS — Z992 Dependence on renal dialysis: Secondary | ICD-10-CM | POA: Diagnosis not present

## 2020-07-12 DIAGNOSIS — D631 Anemia in chronic kidney disease: Secondary | ICD-10-CM | POA: Diagnosis not present

## 2020-07-12 DIAGNOSIS — Z992 Dependence on renal dialysis: Secondary | ICD-10-CM | POA: Diagnosis not present

## 2020-07-12 DIAGNOSIS — N2581 Secondary hyperparathyroidism of renal origin: Secondary | ICD-10-CM | POA: Diagnosis not present

## 2020-07-12 DIAGNOSIS — D509 Iron deficiency anemia, unspecified: Secondary | ICD-10-CM | POA: Diagnosis not present

## 2020-07-12 DIAGNOSIS — N186 End stage renal disease: Secondary | ICD-10-CM | POA: Diagnosis not present

## 2020-07-15 DIAGNOSIS — D509 Iron deficiency anemia, unspecified: Secondary | ICD-10-CM | POA: Diagnosis not present

## 2020-07-15 DIAGNOSIS — N2581 Secondary hyperparathyroidism of renal origin: Secondary | ICD-10-CM | POA: Diagnosis not present

## 2020-07-15 DIAGNOSIS — D631 Anemia in chronic kidney disease: Secondary | ICD-10-CM | POA: Diagnosis not present

## 2020-07-15 DIAGNOSIS — Z992 Dependence on renal dialysis: Secondary | ICD-10-CM | POA: Diagnosis not present

## 2020-07-15 DIAGNOSIS — N186 End stage renal disease: Secondary | ICD-10-CM | POA: Diagnosis not present

## 2020-07-17 ENCOUNTER — Other Ambulatory Visit: Payer: Self-pay

## 2020-07-17 DIAGNOSIS — R109 Unspecified abdominal pain: Secondary | ICD-10-CM | POA: Diagnosis not present

## 2020-07-17 DIAGNOSIS — G8929 Other chronic pain: Secondary | ICD-10-CM | POA: Diagnosis not present

## 2020-07-18 DIAGNOSIS — E1140 Type 2 diabetes mellitus with diabetic neuropathy, unspecified: Secondary | ICD-10-CM | POA: Diagnosis not present

## 2020-07-19 DIAGNOSIS — E1122 Type 2 diabetes mellitus with diabetic chronic kidney disease: Secondary | ICD-10-CM | POA: Diagnosis not present

## 2020-07-19 DIAGNOSIS — D509 Iron deficiency anemia, unspecified: Secondary | ICD-10-CM | POA: Diagnosis not present

## 2020-07-19 DIAGNOSIS — N2581 Secondary hyperparathyroidism of renal origin: Secondary | ICD-10-CM | POA: Diagnosis not present

## 2020-07-19 DIAGNOSIS — N186 End stage renal disease: Secondary | ICD-10-CM | POA: Diagnosis not present

## 2020-07-19 DIAGNOSIS — Z992 Dependence on renal dialysis: Secondary | ICD-10-CM | POA: Diagnosis not present

## 2020-07-19 DIAGNOSIS — D631 Anemia in chronic kidney disease: Secondary | ICD-10-CM | POA: Diagnosis not present

## 2020-07-21 ENCOUNTER — Ambulatory Visit: Payer: Medicare Other | Admitting: Dermatology

## 2020-07-22 ENCOUNTER — Other Ambulatory Visit: Payer: Self-pay | Admitting: Family Medicine

## 2020-07-22 DIAGNOSIS — Z1231 Encounter for screening mammogram for malignant neoplasm of breast: Secondary | ICD-10-CM

## 2020-07-22 DIAGNOSIS — D509 Iron deficiency anemia, unspecified: Secondary | ICD-10-CM | POA: Diagnosis not present

## 2020-07-22 DIAGNOSIS — D631 Anemia in chronic kidney disease: Secondary | ICD-10-CM | POA: Diagnosis not present

## 2020-07-22 DIAGNOSIS — N186 End stage renal disease: Secondary | ICD-10-CM | POA: Diagnosis not present

## 2020-07-22 DIAGNOSIS — Z992 Dependence on renal dialysis: Secondary | ICD-10-CM | POA: Diagnosis not present

## 2020-07-22 DIAGNOSIS — N2581 Secondary hyperparathyroidism of renal origin: Secondary | ICD-10-CM | POA: Diagnosis not present

## 2020-07-24 DIAGNOSIS — D509 Iron deficiency anemia, unspecified: Secondary | ICD-10-CM | POA: Diagnosis not present

## 2020-07-24 DIAGNOSIS — N2581 Secondary hyperparathyroidism of renal origin: Secondary | ICD-10-CM | POA: Diagnosis not present

## 2020-07-24 DIAGNOSIS — Z992 Dependence on renal dialysis: Secondary | ICD-10-CM | POA: Diagnosis not present

## 2020-07-24 DIAGNOSIS — N186 End stage renal disease: Secondary | ICD-10-CM | POA: Diagnosis not present

## 2020-07-24 DIAGNOSIS — D631 Anemia in chronic kidney disease: Secondary | ICD-10-CM | POA: Diagnosis not present

## 2020-07-26 DIAGNOSIS — Z992 Dependence on renal dialysis: Secondary | ICD-10-CM | POA: Diagnosis not present

## 2020-07-26 DIAGNOSIS — N2581 Secondary hyperparathyroidism of renal origin: Secondary | ICD-10-CM | POA: Diagnosis not present

## 2020-07-26 DIAGNOSIS — N186 End stage renal disease: Secondary | ICD-10-CM | POA: Diagnosis not present

## 2020-07-26 DIAGNOSIS — D631 Anemia in chronic kidney disease: Secondary | ICD-10-CM | POA: Diagnosis not present

## 2020-07-26 DIAGNOSIS — D509 Iron deficiency anemia, unspecified: Secondary | ICD-10-CM | POA: Diagnosis not present

## 2020-07-28 ENCOUNTER — Other Ambulatory Visit: Payer: Self-pay | Admitting: Cardiovascular Disease

## 2020-07-29 DIAGNOSIS — N2581 Secondary hyperparathyroidism of renal origin: Secondary | ICD-10-CM | POA: Diagnosis not present

## 2020-07-29 DIAGNOSIS — Z992 Dependence on renal dialysis: Secondary | ICD-10-CM | POA: Diagnosis not present

## 2020-07-29 DIAGNOSIS — D631 Anemia in chronic kidney disease: Secondary | ICD-10-CM | POA: Diagnosis not present

## 2020-07-29 DIAGNOSIS — D509 Iron deficiency anemia, unspecified: Secondary | ICD-10-CM | POA: Diagnosis not present

## 2020-07-29 DIAGNOSIS — N186 End stage renal disease: Secondary | ICD-10-CM | POA: Diagnosis not present

## 2020-07-30 DIAGNOSIS — Z0389 Encounter for observation for other suspected diseases and conditions ruled out: Secondary | ICD-10-CM | POA: Diagnosis not present

## 2020-07-31 DIAGNOSIS — N2581 Secondary hyperparathyroidism of renal origin: Secondary | ICD-10-CM | POA: Diagnosis not present

## 2020-07-31 DIAGNOSIS — N186 End stage renal disease: Secondary | ICD-10-CM | POA: Diagnosis not present

## 2020-07-31 DIAGNOSIS — D631 Anemia in chronic kidney disease: Secondary | ICD-10-CM | POA: Diagnosis not present

## 2020-07-31 DIAGNOSIS — Z992 Dependence on renal dialysis: Secondary | ICD-10-CM | POA: Diagnosis not present

## 2020-07-31 DIAGNOSIS — D509 Iron deficiency anemia, unspecified: Secondary | ICD-10-CM | POA: Diagnosis not present

## 2020-08-02 DIAGNOSIS — D631 Anemia in chronic kidney disease: Secondary | ICD-10-CM | POA: Diagnosis not present

## 2020-08-02 DIAGNOSIS — N2581 Secondary hyperparathyroidism of renal origin: Secondary | ICD-10-CM | POA: Diagnosis not present

## 2020-08-02 DIAGNOSIS — D509 Iron deficiency anemia, unspecified: Secondary | ICD-10-CM | POA: Diagnosis not present

## 2020-08-02 DIAGNOSIS — Z992 Dependence on renal dialysis: Secondary | ICD-10-CM | POA: Diagnosis not present

## 2020-08-02 DIAGNOSIS — N186 End stage renal disease: Secondary | ICD-10-CM | POA: Diagnosis not present

## 2020-08-05 DIAGNOSIS — N2581 Secondary hyperparathyroidism of renal origin: Secondary | ICD-10-CM | POA: Diagnosis not present

## 2020-08-05 DIAGNOSIS — N186 End stage renal disease: Secondary | ICD-10-CM | POA: Diagnosis not present

## 2020-08-05 DIAGNOSIS — D631 Anemia in chronic kidney disease: Secondary | ICD-10-CM | POA: Diagnosis not present

## 2020-08-05 DIAGNOSIS — Z992 Dependence on renal dialysis: Secondary | ICD-10-CM | POA: Diagnosis not present

## 2020-08-05 DIAGNOSIS — D509 Iron deficiency anemia, unspecified: Secondary | ICD-10-CM | POA: Diagnosis not present

## 2020-08-06 ENCOUNTER — Other Ambulatory Visit: Payer: Self-pay

## 2020-08-06 ENCOUNTER — Ambulatory Visit: Payer: Medicare Other | Attending: Family Medicine | Admitting: Family Medicine

## 2020-08-06 ENCOUNTER — Encounter: Payer: Self-pay | Admitting: Family Medicine

## 2020-08-06 ENCOUNTER — Ambulatory Visit (HOSPITAL_COMMUNITY)
Admission: RE | Admit: 2020-08-06 | Discharge: 2020-08-06 | Disposition: A | Discharge status: Home / Self Care | Payer: Medicare Other | Source: Ambulatory Visit | Attending: Family Medicine | Admitting: Family Medicine

## 2020-08-06 VITALS — BP 187/76 | HR 88 | Ht 65.0 in | Wt 144.0 lb

## 2020-08-06 DIAGNOSIS — N185 Chronic kidney disease, stage 5: Secondary | ICD-10-CM

## 2020-08-06 DIAGNOSIS — E1169 Type 2 diabetes mellitus with other specified complication: Secondary | ICD-10-CM | POA: Diagnosis not present

## 2020-08-06 DIAGNOSIS — M79652 Pain in left thigh: Secondary | ICD-10-CM | POA: Diagnosis not present

## 2020-08-06 DIAGNOSIS — E1122 Type 2 diabetes mellitus with diabetic chronic kidney disease: Secondary | ICD-10-CM | POA: Diagnosis not present

## 2020-08-06 DIAGNOSIS — K5909 Other constipation: Secondary | ICD-10-CM

## 2020-08-06 DIAGNOSIS — M1611 Unilateral primary osteoarthritis, right hip: Secondary | ICD-10-CM | POA: Diagnosis not present

## 2020-08-06 DIAGNOSIS — M1612 Unilateral primary osteoarthritis, left hip: Secondary | ICD-10-CM | POA: Diagnosis not present

## 2020-08-06 DIAGNOSIS — I120 Hypertensive chronic kidney disease with stage 5 chronic kidney disease or end stage renal disease: Secondary | ICD-10-CM | POA: Diagnosis not present

## 2020-08-06 DIAGNOSIS — M898X5 Other specified disorders of bone, thigh: Secondary | ICD-10-CM

## 2020-08-06 DIAGNOSIS — M7989 Other specified soft tissue disorders: Secondary | ICD-10-CM | POA: Diagnosis not present

## 2020-08-06 MED ORDER — CYCLOBENZAPRINE HCL 10 MG PO TABS: 10.0000 mg | ORAL_TABLET | Freq: Two times a day (BID) | ORAL | 1 refills | Status: AC | PRN

## 2020-08-06 NOTE — Patient Instructions (Signed)

## 2020-08-06 NOTE — Progress Notes (Signed)
Subjective:  Patient ID: Susan Horton, female    DOB: 10-Apr-1970  Age: 50 y.o. MRN: [id number]  CC: Hospitalization Follow-up   HPI Susan Horton is a 50 year old female with a history of Type 2 Diabetes Mellitus (A1c 4.9), Gastroparesis, (s/p Botox injection of the pylorus) Hypertension, CAD, ESRD on hemodialysis, Asthma. Hospitalized in 06/2020 for CAP, dysphagia.  Interval History: Complains of difficulty moving her bowels and this has been uncontrolled even on Ducolax and this affects her breathing. Last bowel movement was 4 days ago and was a pebble per patient. Used Lactulose, Amitiza which have been ineffective She feels like she is full all the time Used an Enema with no relief.  Complains her L hip hurts and feels like she has a knot there and some days can hardly walk on it or lie on it. She had a remote history of a fall several years ago. Pelvic xray revealed: IMPRESSION: Normal appearing left hip.  Her BP is elevated today.   Past Medical History:  Diagnosis Date   Abnormal stress test    Anemia    Angio-edema    Asthma    CAD (coronary artery disease)    DES to mid LAD July 2018, residual moderate RCA disease   Cataract    CKD (chronic kidney disease) stage 4, GFR 15-29 ml/min (HCC)    Dialysis T/Th/Sa   DVT (deep venous thrombosis) ([place])    1996 during pregnancy, 2015 left leg   Eczema    Essential hypertension 12/11/2015   Gastroparesis    GERD (gastroesophageal reflux disease)    Gluteal abscess 12/27/2018   Hidradenitis    Migraine    Neuropathy    Peripheral vascular disease (HCC)    blood clot in leg   Progressive angina ([place]) 06/05/2014   Chest pain   Type 2 diabetes mellitus ([place])    Type II diabetes mellitus ([place])    Urticaria     Past Surgical History:  Procedure Laterality Date   A/V FISTULAGRAM N/A 06/22/2017   Procedure: A/V FISTULAGRAM - left arm;  Surgeon: [name], MD;  Location: [facility];  Service:  Cardiovascular;  Laterality: N/A;   ABDOMINAL AORTOGRAM W/LOWER EXTREMITY Bilateral 12/14/2018   Procedure: ABDOMINAL AORTOGRAM W/LOWER EXTREMITY;  Surgeon: [name], MD;  Location: [facility] [facility];  Service: Cardiovascular;  Laterality: Bilateral;   ABDOMINAL AORTOGRAM W/LOWER EXTREMITY N/A 04/25/2019   Procedure: ABDOMINAL AORTOGRAM W/LOWER EXTREMITY;  Surgeon: [name], MD;  Location: [facility];  Service: Cardiovascular;  Laterality: N/A;   ABDOMINAL AORTOGRAM W/LOWER EXTREMITY Left 07/18/2019   Procedure: ABDOMINAL AORTOGRAM W/LOWER EXTREMITY;  Surgeon: [name], MD;  Location: [place] [facility];  Service: Cardiovascular;  Laterality: Left;   ABDOMINOPLASTY     ADENOIDECTOMY     APPENDECTOMY  1995   AV FISTULA PLACEMENT Left 02/01/2017   Procedure: ARTERIOVENOUS BRACHIOCEPHALIC (AV) FISTULA CREATION;  Surgeon: [name], MD;  Location: [facility];  Service: Vascular;  Laterality: Left;   [place] INTERVENTION N/A 08/05/2016   Procedure: Coronary Stent Intervention;  Surgeon: [name], MD;  Location: [place] [facility];  Service: Cardiovascular;  Laterality: N/A;   ESOPHAGOGASTRODUODENOSCOPY (EGD) WITH PROPOFOL N/A 07/01/2020   Procedure: ESOPHAGOGASTRODUODENOSCOPY (EGD) WITH PROPOFOL;  Surgeon: Gatha Mayer, MD;  Location: [place];  Service: Endoscopy;  Laterality: N/A;  EXPLORATORY LAPAROTOMY  08/14/2005   lysis of adhesions, drainage of tubo-ovarian abscess   FISTULA SUPERFICIALIZATION Left 09/14/2017   Procedure: FISTULA SUPERFICIALIZATION ARTERIOVENOUS FISTULA LEFT ARM;  Surgeon: [name], MD;  Location: [place];  Service: Vascular;  Laterality: Left;   FLEXIBLE SIGMOIDOSCOPY N/A 01/04/2019   Procedure: FLEXIBLE SIGMOIDOSCOPY;  Surgeon: Clarene Essex, MD;  Location: [facility];  Service: Endoscopy;  Laterality: N/A;   HYDRADENITIS EXCISION   02/08/2011   Procedure: EXCISION HYDRADENITIS GROIN;  Surgeon: [name], MD;  Location: [place];  Service: General;  Laterality: N/A;  Excisioin of Hidradenitis Left groin   INCISION AND DRAINAGE ABSCESS N/A 01/11/2019   Procedure: INCISION AND DRAINAGE SACRAL ABSCESS;  Surgeon: Georganna Skeans, MD;  Location: [place];  Service: General;  Laterality: N/A;   INGUINAL HIDRADENITIS EXCISION  07/06/2010   bilateral   INSERTION OF DIALYSIS CATHETER N/A 09/14/2017   Procedure: INSERTION OF TUNNELED DIALYSIS CATHETER Right Internal Jugular;  Surgeon: [name], MD;  Location: [place];  Service: Vascular;  Laterality: N/A;   IR FLUORO GUIDE CV LINE RIGHT  03/01/2019   IR US GUIDE BX ASP/DRAIN  01/01/2019   IR US GUIDE VASC ACCESS RIGHT  03/01/2019   LEFT HEART CATH AND CORONARY ANGIOGRAPHY N/A 08/05/2016   Procedure: Left Heart Cath and Coronary Angiography;  Surgeon: [name], MD;  Location: [place] [facility];  Service: Cardiovascular;  Laterality: N/A;   PERIPHERAL VASCULAR INTERVENTION Left 12/14/2018   Procedure: PERIPHERAL VASCULAR INTERVENTION;  Surgeon: [name], MD;  Location: [facility] [facility];  Service: Cardiovascular;  Laterality: Left;  SFA   PERIPHERAL VASCULAR INTERVENTION Left 04/25/2019   Procedure: PERIPHERAL VASCULAR INTERVENTION;  Surgeon: [name], MD;  Location: [facility];  Service: Cardiovascular;  Laterality: Left;  right superficial femoral, and left iliac   PERIPHERAL VASCULAR INTERVENTION Left 07/18/2019   Procedure: PERIPHERAL VASCULAR INTERVENTION;  Surgeon: [name], MD;  Location: [facility];  Service: Cardiovascular;  Laterality: Left;  external iliac   REDUCTION MAMMAPLASTY  2002   TONSILLECTOMY     TUBAL LIGATION  1996   VAGINAL HYSTERECTOMY  08/04/2005   and cysto    Family History  Problem Relation Age of Onset   Allergic rhinitis Mother    Hypertension Father    Allergic  rhinitis Father    Hypertension Sister    Allergic rhinitis Sister    Hypertension Brother    Allergic rhinitis Brother    Breast cancer Cousin        x2    Allergies  Allergen Reactions   Penicillins Anaphylaxis, Itching, Swelling, Rash and Other (See Comments)    Swelling of throat & whole mouth  Has patient had a PCN reaction causing immediate rash, facial/tongue/throat swelling, SOB or lightheadedness with hypotension: Yes Has patient had a PCN reaction causing severe rash involving mucus membranes or skin necrosis: No Has patient had a PCN reaction that required hospitalization: No Has patient had a PCN reaction occurring within the last 10 years: No If all of the above answers are "NO", then may proceed with Cephalosporin use.   Repatha [Evolocumab] Itching   Lisinopril Cough    Outpatient Medications Prior to Visit  Medication Sig Dispense Refill   albuterol (VENTOLIN HFA) 108 (90 Base) MCG/ACT inhaler Inhale 1-2 puffs into the lungs every 6 (six) hours as needed for wheezing or shortness of breath. 18 g 2   amLODipine (NORVASC)  10 MG tablet TAKE 1 TABLET BY MOUTH EVERY DAY 90 tablet 3   aspirin 81 MG chewable tablet Chew 1 tablet (81 mg total) by mouth daily. 30 tablet 0   atorvastatin (LIPITOR) 40 MG tablet TAKE 1 TABLET BY MOUTH EVERY DAY 90 tablet 3   B Complex-C-Zn-Folic Acid (DIALYVITE/ZINC) TABS Take 1 tablet by mouth daily.     calcitRIOL (ROCALTROL) 0.5 MCG capsule Take 1.5 mcg by mouth daily.     calcium acetate (PHOSLO) 667 MG capsule Take 667 mg by mouth 3 (three) times daily with meals.  10   clopidogrel (PLAVIX) 75 MG tablet TAKE 1 TABLET (75 MG TOTAL) BY MOUTH DAILY WITH BREAKFAST. 90 tablet 3   doxercalciferol (HECTOROL) 4 MCG/2ML injection Doxercalciferol (Hectorol)     Fluticasone-Salmeterol (ADVAIR) 250-50 MCG/DOSE AEPB Inhale 1 puff into the lungs 2 (two) times daily. 60 each 3   heparin 1000 unit/mL SOLN injection Heparin Sodium (Porcine) 1,000 Units/mL  Systemic     hydrALAZINE (APRESOLINE) 50 MG tablet Take 50 mg by mouth every 8 (eight) hours.     ipratropium-albuterol (DUONEB) 0.5-2.5 (3) MG/3ML SOLN Take 3 mLs by nebulization every 6 (six) hours as needed. 360 mL 2   iron sucrose (VENOFER) 20 MG/ML injection Iron Sucrose (Venofer)     isosorbide mononitrate (IMDUR) 30 MG 24 hr tablet TAKE 0.5 TABLETS (15 MG TOTAL) BY MOUTH DAILY. 45 tablet 3   Methoxy PEG-Epoetin Beta (MIRCERA IJ) Inject into the skin.     metoCLOPramide (REGLAN) 5 MG tablet Take 2 tablets (10 mg total) by mouth 3 (three) times daily before meals for 7 days, THEN 1 tablet (5 mg total) every 8 (eight) hours as needed for nausea or vomiting. 60 tablet 0   metoprolol succinate (TOPROL-XL) 50 MG 24 hr tablet TAKE 1.5 TABLETS BY MOUTH DAILY 135 tablet 3   montelukast (SINGULAIR) 10 MG tablet Take 1 tablet (10 mg total) by mouth at bedtime. 30 tablet 5   nitroGLYCERIN (NITROSTAT) 0.4 MG SL tablet Place 1 tablet (0.4 mg total) under the tongue every 5 (five) minutes as needed for chest pain. 25 tablet 2   ondansetron (ZOFRAN ODT) 4 MG disintegrating tablet Take 1 tablet (4 mg total) by mouth every 8 (eight) hours as needed for nausea or vomiting. 20 tablet 0   pantoprazole (PROTONIX) 40 MG tablet Take 1 tablet (40 mg total) by mouth daily. 30 tablet 0   PRALUENT 75 MG/ML SOAJ INJECT 75 MG INTO THE SKIN EVERY 14 (FOURTEEN) DAYS. 2 mL 11   sucralfate (CARAFATE) 1 GM/10ML suspension Take 10 mLs (1 g total) by mouth 4 (four) times daily -  with meals and at bedtime. 420 mL 0   trimethoprim-polymyxin b (POLYTRIM) ophthalmic solution Place 1 drop into both eyes See admin instructions. Instill one drop into both eyes four times daily for 2 days after each monthly eye injection  12   amitriptyline (ELAVIL) 10 MG tablet Take 1 tablet (10 mg total) by mouth at bedtime. (Patient not taking: No sig reported) 30 tablet 1   lidocaine-prilocaine (EMLA) cream Apply 1 application topically as needed.  (Patient not taking: No sig reported) 30 g 3   No facility-administered medications prior to visit.     ROS Review of Systems  Constitutional:  Negative for activity change, appetite change and fatigue.  HENT:  Negative for congestion, sinus pressure and sore throat.   Eyes:  Negative for visual disturbance.  Respiratory:  Negative for cough, chest tightness,  shortness of breath and wheezing.   Cardiovascular:  Negative for chest pain and palpitations.  Gastrointestinal:  Positive for constipation. Negative for abdominal distention and abdominal pain.  Endocrine: Negative for polydipsia.  Genitourinary:  Negative for dysuria and frequency.  Musculoskeletal:        See HPI  Skin:  Negative for rash.  Neurological:  Negative for tremors, light-headedness and numbness.  Hematological:  Does not bruise/bleed easily.  Psychiatric/Behavioral:  Negative for agitation and behavioral problems.    Objective:  BP (!) 187/76   Pulse 88   Ht 5\' 5"  (1.651 m)   Wt 144 lb (65.3 kg)   SpO2 100%   BMI 23.96 kg/m   BP/Weight 08/06/2020 07/02/2020 7/74/1287  Systolic BP 867 672 -  Diastolic BP 76 75 -  Wt. (Lbs) 144 - 140.43  BMI 23.96 - 23.37      Physical Exam Constitutional:      Appearance: She is well-developed.  Cardiovascular:     Rate and Rhythm: Normal rate.     Heart sounds: Normal heart sounds. No murmur heard. Pulmonary:     Effort: Pulmonary effort is normal.     Breath sounds: Normal breath sounds. No wheezing or rales.  Chest:     Chest wall: No tenderness.  Abdominal:     General: Bowel sounds are normal. There is distension.     Palpations: Abdomen is soft. There is no mass.     Tenderness: There is no abdominal tenderness.  Musculoskeletal:        General: Normal range of motion.     Right lower leg: No edema.     Left lower leg: No edema.     Comments: L thigh swelling which is tender and absent on the R, No erythema noted L upper arm AV fistula   Neurological:     Mental Status: She is alert and oriented to person, place, and time.  Psychiatric:        Mood and Affect: Mood normal.    CMP Latest Ref Rng & Units 07/01/2020 06/29/2020 03/22/2020  Glucose 70 - 99 mg/dL 81 97 105(H)  BUN 6 - 20 mg/dL 46(H) 26(H) 24(H)  Creatinine 0.44 - 1.00 mg/dL 8.62(H) 6.09(H) 8.18(H)  Sodium 135 - 145 mmol/L 139 138 137  Potassium 3.5 - 5.1 mmol/L 4.0 3.7 4.0  Chloride 98 - 111 mmol/L 94(L) 93(L) 94(L)  CO2 22 - 32 mmol/L 26 31 27   Calcium 8.9 - 10.3 mg/dL 9.2 9.6 10.2  Total Protein 6.5 - 8.1 g/dL - 7.1 8.1  Total Bilirubin 0.3 - 1.2 mg/dL - 1.0 1.2  Alkaline Phos 38 - 126 U/L - 101 79  AST 15 - 41 U/L - 21 16  ALT 0 - 44 U/L - 24 12    Lipid Panel     Component Value Date/Time   CHOL 152 01/19/2019 1448   TRIG 149 01/19/2019 1448   HDL 36 (L) 01/19/2019 1448   CHOLHDL 4.2 01/19/2019 1448   CHOLHDL 7.2 Ratio 01/01/2010 2055   VLDL 30 01/01/2010 2055   LDLCALC 90 01/19/2019 1448    CBC    Component Value Date/Time   WBC 5.9 07/01/2020 0832   RBC 4.07 07/01/2020 0832   HGB 9.7 (L) 07/01/2020 0832   HGB 9.2 (L) 07/26/2016 0000   HCT 32.2 (L) 07/01/2020 0832   HCT 30.7 (L) 07/26/2016 0000   PLT 224 07/01/2020 0832   PLT 474 (H) 07/26/2016 0000   MCV 79.1 (  L) 07/01/2020 0832   MCV 82 07/26/2016 0000   MCH 23.8 (L) 07/01/2020 0832   MCHC 30.1 07/01/2020 0832   RDW 19.4 (H) 07/01/2020 0832   RDW 13.6 07/26/2016 0000   LYMPHSABS 1.1 06/29/2020 1310   LYMPHSABS 2.9 07/26/2016 0000   MONOABS 0.4 06/29/2020 1310   EOSABS 0.1 06/29/2020 1310   EOSABS 0.1 07/26/2016 0000   BASOSABS 0.0 06/29/2020 1310   BASOSABS 0.0 07/26/2016 0000    Lab Results  Component Value Date   HGBA1C 4.9 06/30/2020    Assessment & Plan:  1. Type 2 diabetes mellitus with other specified complication, without long-term current use of insulin (HCC) Diet controlled Counseled on Diabetic diet, my plate method, 818 minutes of moderate intensity  exercise/week Blood sugar logs with fasting goals of 80-120 mg/dl, random of less than 180 and in the event of sugars less than 60 mg/dl or greater than 400 mg/dl encouraged to notify the clinic. Advised on the need for annual eye exams, annual foot exams, Pneumonia vaccine.  2. Hypertension associated with stage 5 chronic kidney disease due to type 2 diabetes mellitus ([place]) Uncontrolled She is at risk of hypotension with Hemodialysis Will defer to Nephrology   3. Pain in femur L upper femur with swelling which is absent in R. Will order xrays Refer to Orthopedics after imaging - cyclobenzaprine (FLEXERIL) 10 MG tablet; Take 1 tablet (10 mg total) by mouth 2 (two) times daily as needed.  Dispense: 60 tablet; Refill: 1 - DG FEMUR MIN 2 VIEWS LEFT - DG FEMUR, MIN 2 VIEWS RIGHT  4. Other constipation Uncontrolled Tried laxatives , Amitiza with no relief. - Ambulatory referral to Gastroenterology   Meds ordered this encounter  Medications   cyclobenzaprine (FLEXERIL) 10 MG tablet    Sig: Take 1 tablet (10 mg total) by mouth 2 (two) times daily as needed.    Dispense:  60 tablet    Refill:  1    Follow-up: Return if symptoms worsen or fail to improve.       Charlott Rakes, MD, FAAFP. [facility], [place]   08/06/2020, 12:43 PM

## 2020-08-06 NOTE — Progress Notes (Signed)
Referral to GI for colonoscopy. Constipation. Hip pain.

## 2020-08-07 DIAGNOSIS — N186 End stage renal disease: Secondary | ICD-10-CM | POA: Diagnosis not present

## 2020-08-07 DIAGNOSIS — N2581 Secondary hyperparathyroidism of renal origin: Secondary | ICD-10-CM | POA: Diagnosis not present

## 2020-08-07 DIAGNOSIS — D509 Iron deficiency anemia, unspecified: Secondary | ICD-10-CM | POA: Diagnosis not present

## 2020-08-07 DIAGNOSIS — Z992 Dependence on renal dialysis: Secondary | ICD-10-CM | POA: Diagnosis not present

## 2020-08-07 DIAGNOSIS — D631 Anemia in chronic kidney disease: Secondary | ICD-10-CM | POA: Diagnosis not present

## 2020-08-08 ENCOUNTER — Other Ambulatory Visit: Payer: Self-pay | Admitting: Family Medicine

## 2020-08-08 ENCOUNTER — Telehealth: Payer: Self-pay

## 2020-08-08 DIAGNOSIS — M898X5 Other specified disorders of bone, thigh: Secondary | ICD-10-CM

## 2020-08-08 NOTE — Telephone Encounter (Signed)
-----   Message from Charlott Rakes, MD sent at 08/08/2020 10:08 AM EDT ----- Please inform her that her x-ray showed bone deposits at the site of her left thigh pain which could be secondary to bone problems that can arise in patients with chronic kidney disease in addition to other causes.  Lesions are also present in the right but greater in the left.  I am referring her to orthopedics for further evaluation.

## 2020-08-08 NOTE — Telephone Encounter (Signed)
Patient name and DOB has been verified Patient was informed of lab results. Patient had no questions.  

## 2020-08-09 DIAGNOSIS — N2581 Secondary hyperparathyroidism of renal origin: Secondary | ICD-10-CM | POA: Diagnosis not present

## 2020-08-09 DIAGNOSIS — D509 Iron deficiency anemia, unspecified: Secondary | ICD-10-CM | POA: Diagnosis not present

## 2020-08-09 DIAGNOSIS — Z992 Dependence on renal dialysis: Secondary | ICD-10-CM | POA: Diagnosis not present

## 2020-08-09 DIAGNOSIS — D631 Anemia in chronic kidney disease: Secondary | ICD-10-CM | POA: Diagnosis not present

## 2020-08-09 DIAGNOSIS — N186 End stage renal disease: Secondary | ICD-10-CM | POA: Diagnosis not present

## 2020-08-11 ENCOUNTER — Other Ambulatory Visit: Payer: Self-pay

## 2020-08-11 ENCOUNTER — Encounter: Payer: Self-pay | Admitting: Orthopedic Surgery

## 2020-08-11 ENCOUNTER — Ambulatory Visit (INDEPENDENT_AMBULATORY_CARE_PROVIDER_SITE_OTHER): Payer: Medicare Other | Admitting: Orthopedic Surgery

## 2020-08-11 VITALS — Ht 65.0 in | Wt 145.0 lb

## 2020-08-11 DIAGNOSIS — N186 End stage renal disease: Secondary | ICD-10-CM

## 2020-08-11 DIAGNOSIS — Z794 Long term (current) use of insulin: Secondary | ICD-10-CM | POA: Diagnosis not present

## 2020-08-11 DIAGNOSIS — E1122 Type 2 diabetes mellitus with diabetic chronic kidney disease: Secondary | ICD-10-CM | POA: Diagnosis not present

## 2020-08-11 DIAGNOSIS — Z992 Dependence on renal dialysis: Secondary | ICD-10-CM

## 2020-08-11 DIAGNOSIS — E11610 Type 2 diabetes mellitus with diabetic neuropathic arthropathy: Secondary | ICD-10-CM | POA: Diagnosis not present

## 2020-08-11 NOTE — Progress Notes (Signed)
Office Visit Note   Patient: Susan Horton           Date of Birth: 07/31/1970           MRN: [id number] Visit Date: 08/11/2020              Requested by: Charlott Rakes, MD [place],  [place] [address] PCP: Charlott Rakes, MD  Chief Complaint  Patient presents with   Left Leg - Pain    Needs present about a year.  And she is t thigh mass lateral to the greater trochanter.  Patient complains of pain  HPI: Patient is a 50 year old woman end-stage renal disease on dialysis with diabetes with severe peripheral vascular disease status post  Assessment & Plan: Visit Diagnoses:  1. ESRD (end stage renal disease) on dialysis ([place])   2. Type 2 diabetes mellitus with diabetic neuropathic arthropathy, with long-term current use of insulin ([place])   3. Tumoral calcinosis     Plan: We will order an MRI scan to further evaluate this calcified mass.  Discussed that with the proximity surgical excision may be risky medical management may be an option.   judgmentFollow-Up Instructions: Return if symptoms worsen or fail to improve, for Follow-up after MRI is obtained.Manson Passey Exam  Patient is alert, oriented, no adenopathy, well-dressed, normal affect, normal respiratory effort. Examination patient has a palpable mass over the greater trochanter this is tender to palpation there is no redness no cellulitis no signs of infection.  Patient has no pain with range of motion of the hip knee or ankle she has a negative straight leg raise and no focal motor deficits.  Radiographs just obtained of the right hip shows revascularization to the left lower extremity does show a large calcified mass lateral and posterior to the greater trochanter.  A CT scan in June shows the calcified mass and a CT scan about a year ago shows a much smaller calcified mass.  Imaging: No results found. No images are attached to the encounter.  Labs: Lab Results  Component Value Date   HGBA1C 4.9  06/30/2020   HGBA1C 7.5 (H) 12/26/2018   HGBA1C 9.8 (A) 03/22/2018   ESRSEDRATE 132 (H) 01/12/2019   CRP 2.9 (H) 02/25/2019   CRP 22.8 (H) 01/12/2019   REPTSTATUS 03/06/2019 FINAL 03/01/2019   REPTSTATUS 03/02/2019 FINAL 03/01/2019   GRAMSTAIN  03/01/2019    WBC PRESENT,BOTH PMN AND MONONUCLEAR NO ORGANISMS SEEN CYTOSPIN SMEAR Performed at [facility], [place] [address], [place], [place] [address]    CULT  03/01/2019    NO GROWTH 5 DAYS Performed at [facility] [address] [place], [place] [address]    LABORGA ESCHERICHIA COLI 03/09/2008     Lab Results  Component Value Date   ALBUMIN 3.3 (L) 06/29/2020   ALBUMIN 3.8 03/22/2020   ALBUMIN 1.9 (L) 03/02/2019    Lab Results  Component Value Date   MG 2.1 07/01/2020   No results found for: VD25OH  No results found for: PREALBUMIN CBC EXTENDED Latest Ref Rng & Units 07/01/2020 06/29/2020 03/22/2020  WBC 4.0 - 10.5 K/uL 5.9 5.5 8.1  RBC 3.87 - 5.11 MIL/uL 4.07 4.42 4.71  HGB 12.0 - 15.0 g/dL 9.7(L) 10.6(L) 12.3  HCT 36.0 - 46.0 % 32.2(L) 36.2 39.7  PLT 150 - 400 K/uL 224 212 327  NEUTROABS 1.7 - 7.7 K/uL - 3.9 -  LYMPHSABS 0.7 - 4.0 K/uL - 1.1 -  Body mass index is 24.13 kg/m.  Orders:  No orders of the defined types were placed in this encounter.  No orders of the defined types were placed in this encounter.    Procedures: No procedures performed  Clinical Data: No additional findings.  ROS:  All other systems negative, except as noted in the HPI. Review of Systems  Objective: Vital Signs: Ht 5\' 5"  (1.651 m)   Wt 145 lb (65.8 kg)   BMI 24.13 kg/m   Specialty Comments:  No specialty comments available.  PMFS History: Patient Active Problem List   Diagnosis Date Noted   Dysphagia    Nausea 09/12/2019   Diarrhea, unspecified 09/12/2019   Pruritus, unspecified 08/13/2019   Anaphylactic shock, unspecified, sequela 08/13/2019   Allergy, unspecified, sequela 08/13/2019    Unspecified cataract 03/19/2019   Unspecified asthma with (acute) exacerbation 03/19/2019   COVID-19 03/06/2019   Intractable nausea and vomiting 02/24/2019   Abdominal pain, epigastric 02/24/2019   Multifocal pneumonia 02/24/2019   Shortness of breath 01/19/2019   Cerebrovascular small vessel disease    Generalized weakness    Abscess of sacrum (HCC)    Gluteal abscess 12/25/2018   Moderate protein-calorie malnutrition ([place]) 12/19/2018   Other disorders resulting from impaired renal tubular function 12/19/2018   Liver disease, unspecified 12/19/2018   Hyperkalemia 12/19/2018   Acidosis 12/19/2018   Hyperlipidemia 11/10/2018   History of arteriosclerotic vascular disease 11/10/2018   Carotid stenosis 11/10/2018   Symptomatic anemia 09/16/2018   Peripheral vascular disease, unspecified ([place]) 09/04/2018   Other disorders of bilirubin metabolism 01/20/2018   Other long term (current) drug therapy 11/28/2017   Other abnormal findings in urine 11/28/2017   Dependence on renal dialysis ([place]) 11/28/2017   ESRD on peritoneal dialysis ([place]) 17/00/1749   Complication of vascular dialysis catheter 09/15/2017   History of anemia due to CKD 07/18/2017   DVT (deep vein thrombosis) in pregnancy 07/18/2017   Encounter for removal of sutures 07/07/2017   Iron deficiency anemia, unspecified 06/30/2017   Secondary hyperparathyroidism of renal origin ([place]) 06/24/2017   Pain, unspecified 06/24/2017   Fever, unspecified 06/24/2017   End stage renal disease ([place]) 06/24/2017   Coagulation defect, unspecified ([place]) 06/24/2017   Anemia in chronic kidney disease 06/24/2017   Chronic diastolic CHF (congestive heart failure) ([place]) 06/18/2017   Moderate persistent asthma 06/10/2017   Adjustment disorder with depressed mood 09/08/2016   CKD (chronic kidney disease) stage 4, GFR 15-29 ml/min (HCC)    CAD S/P mLAD PCI with DES 08/06/2016   Presence of drug coated stent in LAD coronary artery 08/06/2016    Atherosclerotic heart disease of native coronary artery without angina pectoris 07/11/2016   Carotid bruit present 06/15/2016   Dyslipidemia 06/15/2016   Smoker 06/15/2016   Neuropathy 03/10/2016   Essential hypertension, benign 12/11/2015   Left facial pain 03/04/2015   Abnormal sense of taste 03/04/2015   Gastroparesis    Uncontrolled type 2 diabetes mellitus with peripheral neuropathy ([place]) 06/05/2014   Yeast infection of the vagina 07/10/2010   Past Medical History:  Diagnosis Date   Abnormal stress test    Anemia    Angio-edema    Asthma    CAD (coronary artery disease)    DES to mid LAD July 2018, residual moderate RCA disease   Cataract    CKD (chronic kidney disease) stage 4, GFR 15-29 ml/min (HCC)    Dialysis T/Th/Sa   DVT (deep venous thrombosis) ([place])    1996 during pregnancy, 2015 left  leg   Eczema    Essential hypertension 12/11/2015   Gastroparesis    GERD (gastroesophageal reflux disease)    Gluteal abscess 12/27/2018   Hidradenitis    Migraine    Neuropathy    Peripheral vascular disease (HCC)    blood clot in leg   Progressive angina ([place]) 06/05/2014   Chest pain   Type 2 diabetes mellitus ([place])    Type II diabetes mellitus ([place])    Urticaria     Family History  Problem Relation Age of Onset   Allergic rhinitis Mother    Hypertension Father    Allergic rhinitis Father    Hypertension Sister    Allergic rhinitis Sister    Hypertension Brother    Allergic rhinitis Brother    Breast cancer Cousin        x2    Past Surgical History:  Procedure Laterality Date   A/V FISTULAGRAM N/A 06/22/2017   Procedure: A/V FISTULAGRAM - left arm;  Surgeon: [name], MD;  Location: [facility];  Service: Cardiovascular;  Laterality: N/A;   ABDOMINAL AORTOGRAM W/LOWER EXTREMITY Bilateral 12/14/2018   Procedure: ABDOMINAL AORTOGRAM W/LOWER EXTREMITY;  Surgeon: [name], MD;  Location: [facility];  Service: Cardiovascular;   Laterality: Bilateral;   ABDOMINAL AORTOGRAM W/LOWER EXTREMITY N/A 04/25/2019   Procedure: ABDOMINAL AORTOGRAM W/LOWER EXTREMITY;  Surgeon: [name], MD;  Location: [facility];  Service: Cardiovascular;  Laterality: N/A;   ABDOMINAL AORTOGRAM W/LOWER EXTREMITY Left 07/18/2019   Procedure: ABDOMINAL AORTOGRAM W/LOWER EXTREMITY;  Surgeon: [name], MD;  Location: [facility];  Service: Cardiovascular;  Laterality: Left;   ABDOMINOPLASTY     ADENOIDECTOMY     APPENDECTOMY  1995   AV FISTULA PLACEMENT Left 02/01/2017   Procedure: ARTERIOVENOUS BRACHIOCEPHALIC (AV) FISTULA CREATION;  Surgeon: [name], MD;  Location: [facility];  Service: Vascular;  Laterality: Left;   [place] INTERVENTION N/A 08/05/2016   Procedure: Coronary Stent Intervention;  Surgeon: [name], MD;  Location: [facility];  Service: Cardiovascular;  Laterality: N/A;   ESOPHAGOGASTRODUODENOSCOPY (EGD) WITH PROPOFOL N/A 07/01/2020   Procedure: ESOPHAGOGASTRODUODENOSCOPY (EGD) WITH PROPOFOL;  Surgeon: Gatha Mayer, MD;  Location: [facility];  Service: Endoscopy;  Laterality: N/A;   EXPLORATORY LAPAROTOMY  08/14/2005   lysis of adhesions, drainage of tubo-ovarian abscess   FISTULA SUPERFICIALIZATION Left 09/14/2017   Procedure: FISTULA SUPERFICIALIZATION ARTERIOVENOUS FISTULA LEFT ARM;  Surgeon: [name], MD;  Location: [facility];  Service: Vascular;  Laterality: Left;   FLEXIBLE SIGMOIDOSCOPY N/A 01/04/2019   Procedure: FLEXIBLE SIGMOIDOSCOPY;  Surgeon: Clarene Essex, MD;  Location: [facility];  Service: Endoscopy;  Laterality: N/A;   HYDRADENITIS EXCISION  02/08/2011   Procedure: EXCISION HYDRADENITIS GROIN;  Surgeon: [name], MD;  Location: [facility];  Service: General;  Laterality: N/A;  Excisioin of Hidradenitis Left groin   INCISION AND DRAINAGE ABSCESS N/A  01/11/2019   Procedure: INCISION AND DRAINAGE SACRAL ABSCESS;  Surgeon: Georganna Skeans, MD;  Location: [facility];  Service: General;  Laterality: N/A;   INGUINAL HIDRADENITIS EXCISION  07/06/2010   bilateral   INSERTION OF DIALYSIS CATHETER N/A 09/14/2017   Procedure: INSERTION OF TUNNELED DIALYSIS CATHETER Right Internal Jugular;  Surgeon: [name], MD;  Location: [facility];  Service: Vascular;  Laterality: N/A;   IR FLUORO GUIDE CV LINE  RIGHT  03/01/2019   IR US GUIDE BX ASP/DRAIN  01/01/2019   IR US GUIDE VASC ACCESS RIGHT  03/01/2019   LEFT HEART CATH AND CORONARY ANGIOGRAPHY N/A 08/05/2016   Procedure: Left Heart Cath and Coronary Angiography;  Surgeon: [name], MD;  Location: [facility];  Service: Cardiovascular;  Laterality: N/A;   PERIPHERAL VASCULAR INTERVENTION Left 12/14/2018   Procedure: PERIPHERAL VASCULAR INTERVENTION;  Surgeon: [name], MD;  Location: [facility];  Service: Cardiovascular;  Laterality: Left;  SFA   PERIPHERAL VASCULAR INTERVENTION Left 04/25/2019   Procedure: PERIPHERAL VASCULAR INTERVENTION;  Surgeon: [name], MD;  Location: [facility];  Service: Cardiovascular;  Laterality: Left;  right superficial femoral, and left iliac   PERIPHERAL VASCULAR INTERVENTION Left 07/18/2019   Procedure: PERIPHERAL VASCULAR INTERVENTION;  Surgeon: [name], MD;  Location: [facility];  Service: Cardiovascular;  Laterality: Left;  external iliac   REDUCTION MAMMAPLASTY  2002   TONSILLECTOMY     TUBAL LIGATION  1996   VAGINAL HYSTERECTOMY  08/04/2005   and cysto   Social History   Occupational History   Occupation: Curator  Tobacco Use   Smoking status: Former    Years: 20.00    Types: Cigarettes    Quit date: 11/2016    Years since quitting: 3.7   Smokeless tobacco: Never  Vaping Use   Vaping Use: Never used  Substance and Sexual Activity   Alcohol use: No    Alcohol/week: 0.0  standard drinks   Drug use: Yes    Frequency: 2.0 times per week    Types: Marijuana   Sexual activity: Not on file

## 2020-08-12 DIAGNOSIS — N2581 Secondary hyperparathyroidism of renal origin: Secondary | ICD-10-CM | POA: Diagnosis not present

## 2020-08-12 DIAGNOSIS — D631 Anemia in chronic kidney disease: Secondary | ICD-10-CM | POA: Diagnosis not present

## 2020-08-12 DIAGNOSIS — N186 End stage renal disease: Secondary | ICD-10-CM | POA: Diagnosis not present

## 2020-08-12 DIAGNOSIS — D509 Iron deficiency anemia, unspecified: Secondary | ICD-10-CM | POA: Diagnosis not present

## 2020-08-12 DIAGNOSIS — Z992 Dependence on renal dialysis: Secondary | ICD-10-CM | POA: Diagnosis not present

## 2020-08-14 DIAGNOSIS — Z992 Dependence on renal dialysis: Secondary | ICD-10-CM | POA: Diagnosis not present

## 2020-08-14 DIAGNOSIS — N186 End stage renal disease: Secondary | ICD-10-CM | POA: Diagnosis not present

## 2020-08-14 DIAGNOSIS — N2581 Secondary hyperparathyroidism of renal origin: Secondary | ICD-10-CM | POA: Diagnosis not present

## 2020-08-14 DIAGNOSIS — D631 Anemia in chronic kidney disease: Secondary | ICD-10-CM | POA: Diagnosis not present

## 2020-08-14 DIAGNOSIS — D509 Iron deficiency anemia, unspecified: Secondary | ICD-10-CM | POA: Diagnosis not present

## 2020-08-15 DIAGNOSIS — E1140 Type 2 diabetes mellitus with diabetic neuropathy, unspecified: Secondary | ICD-10-CM | POA: Diagnosis not present

## 2020-08-15 DIAGNOSIS — G8929 Other chronic pain: Secondary | ICD-10-CM | POA: Diagnosis not present

## 2020-08-15 DIAGNOSIS — R109 Unspecified abdominal pain: Secondary | ICD-10-CM | POA: Diagnosis not present

## 2020-08-16 DIAGNOSIS — Z992 Dependence on renal dialysis: Secondary | ICD-10-CM | POA: Diagnosis not present

## 2020-08-16 DIAGNOSIS — N186 End stage renal disease: Secondary | ICD-10-CM | POA: Diagnosis not present

## 2020-08-16 DIAGNOSIS — D631 Anemia in chronic kidney disease: Secondary | ICD-10-CM | POA: Diagnosis not present

## 2020-08-16 DIAGNOSIS — N2581 Secondary hyperparathyroidism of renal origin: Secondary | ICD-10-CM | POA: Diagnosis not present

## 2020-08-16 DIAGNOSIS — D509 Iron deficiency anemia, unspecified: Secondary | ICD-10-CM | POA: Diagnosis not present

## 2020-08-23 DIAGNOSIS — N2581 Secondary hyperparathyroidism of renal origin: Secondary | ICD-10-CM | POA: Diagnosis not present

## 2020-08-23 DIAGNOSIS — N186 End stage renal disease: Secondary | ICD-10-CM | POA: Diagnosis not present

## 2020-08-23 DIAGNOSIS — D631 Anemia in chronic kidney disease: Secondary | ICD-10-CM | POA: Diagnosis not present

## 2020-08-23 DIAGNOSIS — Z992 Dependence on renal dialysis: Secondary | ICD-10-CM | POA: Diagnosis not present

## 2020-08-23 DIAGNOSIS — D509 Iron deficiency anemia, unspecified: Secondary | ICD-10-CM | POA: Diagnosis not present

## 2020-08-25 DIAGNOSIS — N2581 Secondary hyperparathyroidism of renal origin: Secondary | ICD-10-CM | POA: Diagnosis not present

## 2020-08-25 DIAGNOSIS — D509 Iron deficiency anemia, unspecified: Secondary | ICD-10-CM | POA: Diagnosis not present

## 2020-08-25 DIAGNOSIS — N186 End stage renal disease: Secondary | ICD-10-CM | POA: Diagnosis not present

## 2020-08-25 DIAGNOSIS — Z992 Dependence on renal dialysis: Secondary | ICD-10-CM | POA: Diagnosis not present

## 2020-08-25 DIAGNOSIS — D631 Anemia in chronic kidney disease: Secondary | ICD-10-CM | POA: Diagnosis not present

## 2020-08-27 ENCOUNTER — Other Ambulatory Visit: Payer: Self-pay

## 2020-08-27 ENCOUNTER — Ambulatory Visit (HOSPITAL_COMMUNITY)
Admission: RE | Admit: 2020-08-27 | Discharge: 2020-08-27 | Disposition: A | Discharge status: Home / Self Care | Payer: Medicare Other | Source: Ambulatory Visit | Attending: Orthopedic Surgery | Admitting: Orthopedic Surgery

## 2020-08-27 DIAGNOSIS — Z794 Long term (current) use of insulin: Secondary | ICD-10-CM | POA: Diagnosis not present

## 2020-08-27 DIAGNOSIS — D509 Iron deficiency anemia, unspecified: Secondary | ICD-10-CM | POA: Diagnosis not present

## 2020-08-27 DIAGNOSIS — M25452 Effusion, left hip: Secondary | ICD-10-CM | POA: Diagnosis not present

## 2020-08-27 DIAGNOSIS — M7062 Trochanteric bursitis, left hip: Secondary | ICD-10-CM | POA: Diagnosis not present

## 2020-08-27 DIAGNOSIS — M1612 Unilateral primary osteoarthritis, left hip: Secondary | ICD-10-CM | POA: Diagnosis not present

## 2020-08-27 DIAGNOSIS — E11610 Type 2 diabetes mellitus with diabetic neuropathic arthropathy: Secondary | ICD-10-CM | POA: Insufficient documentation

## 2020-08-27 DIAGNOSIS — N186 End stage renal disease: Secondary | ICD-10-CM | POA: Insufficient documentation

## 2020-08-27 DIAGNOSIS — Z992 Dependence on renal dialysis: Secondary | ICD-10-CM | POA: Diagnosis not present

## 2020-08-27 DIAGNOSIS — N2581 Secondary hyperparathyroidism of renal origin: Secondary | ICD-10-CM | POA: Diagnosis not present

## 2020-08-27 DIAGNOSIS — D631 Anemia in chronic kidney disease: Secondary | ICD-10-CM | POA: Diagnosis not present

## 2020-08-29 DIAGNOSIS — Z992 Dependence on renal dialysis: Secondary | ICD-10-CM | POA: Diagnosis not present

## 2020-08-29 DIAGNOSIS — D509 Iron deficiency anemia, unspecified: Secondary | ICD-10-CM | POA: Diagnosis not present

## 2020-08-29 DIAGNOSIS — D631 Anemia in chronic kidney disease: Secondary | ICD-10-CM | POA: Diagnosis not present

## 2020-08-29 DIAGNOSIS — N186 End stage renal disease: Secondary | ICD-10-CM | POA: Diagnosis not present

## 2020-08-29 DIAGNOSIS — N2581 Secondary hyperparathyroidism of renal origin: Secondary | ICD-10-CM | POA: Diagnosis not present

## 2020-09-01 ENCOUNTER — Other Ambulatory Visit: Payer: Self-pay

## 2020-09-01 ENCOUNTER — Ambulatory Visit (INDEPENDENT_AMBULATORY_CARE_PROVIDER_SITE_OTHER): Payer: Medicare Other | Admitting: Orthopedic Surgery

## 2020-09-01 ENCOUNTER — Encounter: Payer: Self-pay | Admitting: Orthopedic Surgery

## 2020-09-01 DIAGNOSIS — D631 Anemia in chronic kidney disease: Secondary | ICD-10-CM | POA: Diagnosis not present

## 2020-09-01 DIAGNOSIS — D509 Iron deficiency anemia, unspecified: Secondary | ICD-10-CM | POA: Diagnosis not present

## 2020-09-01 DIAGNOSIS — Z992 Dependence on renal dialysis: Secondary | ICD-10-CM | POA: Diagnosis not present

## 2020-09-01 DIAGNOSIS — E11610 Type 2 diabetes mellitus with diabetic neuropathic arthropathy: Secondary | ICD-10-CM | POA: Diagnosis not present

## 2020-09-01 DIAGNOSIS — Z794 Long term (current) use of insulin: Secondary | ICD-10-CM

## 2020-09-01 DIAGNOSIS — N186 End stage renal disease: Secondary | ICD-10-CM

## 2020-09-01 DIAGNOSIS — N2581 Secondary hyperparathyroidism of renal origin: Secondary | ICD-10-CM | POA: Diagnosis not present

## 2020-09-01 NOTE — Progress Notes (Signed)
Office Visit Note   Patient: Susan Horton           Date of Birth: 1970-12-24           MRN: [id number] Visit Date: 09/01/2020              Requested by: Charlott Rakes, MD [place],  [place] [address] PCP: Charlott Rakes, MD  Chief Complaint  Patient presents with   Left Hip - Follow-up    MRI review      HPI: Patient is a 50 year old woman with diabetes end-stage renal disease on dialysis with a tumoral calcinosis mass of the left hip.  Patient occasionally has radicular symptoms secondary to compression on the sciatic nerve she is status post an MRI scan.  Assessment & Plan: Visit Diagnoses:  1. ESRD (end stage renal disease) on dialysis ([place])   2. Type 2 diabetes mellitus with diabetic neuropathic arthropathy, with long-term current use of insulin ([place])   3. Tumoral calcinosis     Plan: Recommended medical management with dietary phosphate restrictions as well as adjustments to her dialysis.  With the large size of the mass and adjacent to the sciatic nerve discussed that it would be risky for excision of the mass and possible injury to the sciatic nerve.  Follow-Up Instructions: Return if symptoms worsen or fail to improve.   Ortho Exam  Patient is alert, oriented, no adenopathy, well-dressed, normal affect, normal respiratory effort. Examination patient has no focal motor weakness in the left lower extremity.  She has a negative straight leg raise.  She does have an antalgic gait.  Review of the MRI scan shows a large mass in the left hip consistent with tumoral calcinosis.  This has been present when compared to previous CT scans.  Imaging: No results found. No images are attached to the encounter.  Labs: Lab Results  Component Value Date   HGBA1C 4.9 06/30/2020   HGBA1C 7.5 (H) 12/26/2018   HGBA1C 9.8 (A) 03/22/2018   ESRSEDRATE 132 (H) 01/12/2019   CRP 2.9 (H) 02/25/2019   CRP 22.8 (H) 01/12/2019   REPTSTATUS 03/06/2019 FINAL 03/01/2019    REPTSTATUS 03/02/2019 FINAL 03/01/2019   GRAMSTAIN  03/01/2019    WBC PRESENT,BOTH PMN AND MONONUCLEAR NO ORGANISMS SEEN CYTOSPIN SMEAR Performed at [facility], [place] [address] [place], [place] [address]    CULT  03/01/2019    NO GROWTH 5 DAYS Performed at [facility] [address] [place], [place] [address]    LABORGA ESCHERICHIA COLI 03/09/2008     Lab Results  Component Value Date   ALBUMIN 3.3 (L) 06/29/2020   ALBUMIN 3.8 03/22/2020   ALBUMIN 1.9 (L) 03/02/2019    Lab Results  Component Value Date   MG 2.1 07/01/2020   No results found for: VD25OH  No results found for: PREALBUMIN CBC EXTENDED Latest Ref Rng & Units 07/01/2020 06/29/2020 03/22/2020  WBC 4.0 - 10.5 K/uL 5.9 5.5 8.1  RBC 3.87 - 5.11 MIL/uL 4.07 4.42 4.71  HGB 12.0 - 15.0 g/dL 9.7(L) 10.6(L) 12.3  HCT 36.0 - 46.0 % 32.2(L) 36.2 39.7  PLT 150 - 400 K/uL 224 212 327  NEUTROABS 1.7 - 7.7 K/uL - 3.9 -  LYMPHSABS 0.7 - 4.0 K/uL - 1.1 -     There is no height or weight on file to calculate BMI.  Orders:  No orders of the defined types were placed in this encounter.  No orders of  the defined types were placed in this encounter.    Procedures: No procedures performed  Clinical Data: No additional findings.  ROS:  All other systems negative, except as noted in the HPI. Review of Systems  Objective: Vital Signs: There were no vitals taken for this visit.  Specialty Comments:  No specialty comments available.  PMFS History: Patient Active Problem List   Diagnosis Date Noted   Dysphagia    Nausea 09/12/2019   Diarrhea, unspecified 09/12/2019   Pruritus, unspecified 08/13/2019   Anaphylactic shock, unspecified, sequela 08/13/2019   Allergy, unspecified, sequela 08/13/2019   Unspecified cataract 03/19/2019   Unspecified asthma with (acute) exacerbation 03/19/2019   COVID-19 03/06/2019   Intractable nausea and vomiting 02/24/2019   Abdominal pain, epigastric 02/24/2019    Multifocal pneumonia 02/24/2019   Shortness of breath 01/19/2019   Cerebrovascular small vessel disease    Generalized weakness    Abscess of sacrum (HCC)    Gluteal abscess 12/25/2018   Moderate protein-calorie malnutrition ([place]) 12/19/2018   Other disorders resulting from impaired renal tubular function 12/19/2018   Liver disease, unspecified 12/19/2018   Hyperkalemia 12/19/2018   Acidosis 12/19/2018   Hyperlipidemia 11/10/2018   History of arteriosclerotic vascular disease 11/10/2018   Carotid stenosis 11/10/2018   Symptomatic anemia 09/16/2018   Peripheral vascular disease, unspecified ([place]) 09/04/2018   Other disorders of bilirubin metabolism 01/20/2018   Other long term (current) drug therapy 11/28/2017   Other abnormal findings in urine 11/28/2017   Dependence on renal dialysis ([place]) 11/28/2017   ESRD on peritoneal dialysis ([place]) 99/83/3825   Complication of vascular dialysis catheter 09/15/2017   History of anemia due to CKD 07/18/2017   DVT (deep vein thrombosis) in pregnancy 07/18/2017   Encounter for removal of sutures 07/07/2017   Iron deficiency anemia, unspecified 06/30/2017   Secondary hyperparathyroidism of renal origin ([place]) 06/24/2017   Pain, unspecified 06/24/2017   Fever, unspecified 06/24/2017   End stage renal disease ([place]) 06/24/2017   Coagulation defect, unspecified ([place]) 06/24/2017   Anemia in chronic kidney disease 06/24/2017   Chronic diastolic CHF (congestive heart failure) ([place]) 06/18/2017   Moderate persistent asthma 06/10/2017   Adjustment disorder with depressed mood 09/08/2016   CKD (chronic kidney disease) stage 4, GFR 15-29 ml/min (HCC)    CAD S/P mLAD PCI with DES 08/06/2016   Presence of drug coated stent in LAD coronary artery 08/06/2016   Atherosclerotic heart disease of native coronary artery without angina pectoris 07/11/2016   Carotid bruit present 06/15/2016   Dyslipidemia 06/15/2016   Smoker 06/15/2016   Neuropathy 03/10/2016    Essential hypertension, benign 12/11/2015   Left facial pain 03/04/2015   Abnormal sense of taste 03/04/2015   Gastroparesis    Uncontrolled type 2 diabetes mellitus with peripheral neuropathy ([place]) 06/05/2014   Yeast infection of the vagina 07/10/2010   Past Medical History:  Diagnosis Date   Abnormal stress test    Anemia    Angio-edema    Asthma    CAD (coronary artery disease)    DES to mid LAD July 2018, residual moderate RCA disease   Cataract    CKD (chronic kidney disease) stage 4, GFR 15-29 ml/min (HCC)    Dialysis T/Th/Sa   DVT (deep venous thrombosis) ([place])    1996 during pregnancy, 2015 left leg   Eczema    Essential hypertension 12/11/2015   Gastroparesis    GERD (gastroesophageal reflux disease)    Gluteal abscess 12/27/2018   Hidradenitis    Migraine  Neuropathy    Peripheral vascular disease (HCC)    blood clot in leg   Progressive angina (HCC) 06/05/2014   Chest pain   Type 2 diabetes mellitus ([place])    Type II diabetes mellitus ([place])    Urticaria     Family History  Problem Relation Age of Onset   Allergic rhinitis Mother    Hypertension Father    Allergic rhinitis Father    Hypertension Sister    Allergic rhinitis Sister    Hypertension Brother    Allergic rhinitis Brother    Breast cancer Cousin        x2    Past Surgical History:  Procedure Laterality Date   A/V FISTULAGRAM N/A 06/22/2017   Procedure: A/V FISTULAGRAM - left arm;  Surgeon: [name], MD;  Location: [facility];  Service: Cardiovascular;  Laterality: N/A;   ABDOMINAL AORTOGRAM W/LOWER EXTREMITY Bilateral 12/14/2018   Procedure: ABDOMINAL AORTOGRAM W/LOWER EXTREMITY;  Surgeon: [name], MD;  Location: [facility];  Service: Cardiovascular;  Laterality: Bilateral;   ABDOMINAL AORTOGRAM W/LOWER EXTREMITY N/A 04/25/2019   Procedure: ABDOMINAL AORTOGRAM W/LOWER EXTREMITY;  Surgeon: [name], MD;  Location: [facility];   Service: Cardiovascular;  Laterality: N/A;   ABDOMINAL AORTOGRAM W/LOWER EXTREMITY Left 07/18/2019   Procedure: ABDOMINAL AORTOGRAM W/LOWER EXTREMITY;  Surgeon: [name], MD;  Location: [facility];  Service: Cardiovascular;  Laterality: Left;   ABDOMINOPLASTY     ADENOIDECTOMY     APPENDECTOMY  1995   AV FISTULA PLACEMENT Left 02/01/2017   Procedure: ARTERIOVENOUS BRACHIOCEPHALIC (AV) FISTULA CREATION;  Surgeon: [name], MD;  Location: [facility];  Service: Vascular;  Laterality: Left;   [place] INTERVENTION N/A 08/05/2016   Procedure: Coronary Stent Intervention;  Surgeon: [name], MD;  Location: [facility];  Service: Cardiovascular;  Laterality: N/A;   ESOPHAGOGASTRODUODENOSCOPY (EGD) WITH PROPOFOL N/A 07/01/2020   Procedure: ESOPHAGOGASTRODUODENOSCOPY (EGD) WITH PROPOFOL;  Surgeon: Gatha Mayer, MD;  Location: [facility];  Service: Endoscopy;  Laterality: N/A;   EXPLORATORY LAPAROTOMY  08/14/2005   lysis of adhesions, drainage of tubo-ovarian abscess   FISTULA SUPERFICIALIZATION Left 09/14/2017   Procedure: FISTULA SUPERFICIALIZATION ARTERIOVENOUS FISTULA LEFT ARM;  Surgeon: [name], MD;  Location: [facility];  Service: Vascular;  Laterality: Left;   FLEXIBLE SIGMOIDOSCOPY N/A 01/04/2019   Procedure: FLEXIBLE SIGMOIDOSCOPY;  Surgeon: Clarene Essex, MD;  Location: [facility];  Service: Endoscopy;  Laterality: N/A;   HYDRADENITIS EXCISION  02/08/2011   Procedure: EXCISION HYDRADENITIS GROIN;  Surgeon: [name], MD;  Location: [facility];  Service: General;  Laterality: N/A;  Excisioin of Hidradenitis Left groin   INCISION AND DRAINAGE ABSCESS N/A 01/11/2019   Procedure: INCISION AND DRAINAGE SACRAL ABSCESS;  Surgeon: Georganna Skeans, MD;  Location: [facility];  Service: General;  Laterality: N/A;   INGUINAL HIDRADENITIS EXCISION  07/06/2010    bilateral   INSERTION OF DIALYSIS CATHETER N/A 09/14/2017   Procedure: INSERTION OF TUNNELED DIALYSIS CATHETER Right Internal Jugular;  Surgeon: [name], MD;  Location: [facility];  Service: Vascular;  Laterality: N/A;   IR FLUORO GUIDE CV LINE RIGHT  03/01/2019   IR US GUIDE BX ASP/DRAIN  01/01/2019   IR US GUIDE VASC ACCESS RIGHT  03/01/2019   LEFT HEART CATH AND CORONARY ANGIOGRAPHY N/A 08/05/2016   Procedure: Left  Heart Cath and Coronary Angiography;  Surgeon: [name], MD;  Location: [facility];  Service: Cardiovascular;  Laterality: N/A;   PERIPHERAL VASCULAR INTERVENTION Left 12/14/2018   Procedure: PERIPHERAL VASCULAR INTERVENTION;  Surgeon: [name], MD;  Location: [facility];  Service: Cardiovascular;  Laterality: Left;  SFA   PERIPHERAL VASCULAR INTERVENTION Left 04/25/2019   Procedure: PERIPHERAL VASCULAR INTERVENTION;  Surgeon: [name], MD;  Location: [facility];  Service: Cardiovascular;  Laterality: Left;  right superficial femoral, and left iliac   PERIPHERAL VASCULAR INTERVENTION Left 07/18/2019   Procedure: PERIPHERAL VASCULAR INTERVENTION;  Surgeon: [name], MD;  Location: [facility];  Service: Cardiovascular;  Laterality: Left;  external iliac   REDUCTION MAMMAPLASTY  2002   TONSILLECTOMY     TUBAL LIGATION  1996   VAGINAL HYSTERECTOMY  08/04/2005   and cysto   Social History   Occupational History   Occupation: Curator  Tobacco Use   Smoking status: Former    Years: 20.00    Types: Cigarettes    Quit date: 11/2016    Years since quitting: 3.8   Smokeless tobacco: Never  Vaping Use   Vaping Use: Never used  Substance and Sexual Activity   Alcohol use: No    Alcohol/week: 0.0 standard drinks   Drug use: Yes    Frequency: 2.0 times per week    Types: Marijuana   Sexual activity: Not on file

## 2020-09-03 DIAGNOSIS — D509 Iron deficiency anemia, unspecified: Secondary | ICD-10-CM | POA: Diagnosis not present

## 2020-09-03 DIAGNOSIS — Z992 Dependence on renal dialysis: Secondary | ICD-10-CM | POA: Diagnosis not present

## 2020-09-03 DIAGNOSIS — N186 End stage renal disease: Secondary | ICD-10-CM | POA: Diagnosis not present

## 2020-09-03 DIAGNOSIS — N2581 Secondary hyperparathyroidism of renal origin: Secondary | ICD-10-CM | POA: Diagnosis not present

## 2020-09-03 DIAGNOSIS — D631 Anemia in chronic kidney disease: Secondary | ICD-10-CM | POA: Diagnosis not present

## 2020-09-04 DIAGNOSIS — N2581 Secondary hyperparathyroidism of renal origin: Secondary | ICD-10-CM | POA: Diagnosis not present

## 2020-09-04 DIAGNOSIS — D631 Anemia in chronic kidney disease: Secondary | ICD-10-CM | POA: Diagnosis not present

## 2020-09-04 DIAGNOSIS — D509 Iron deficiency anemia, unspecified: Secondary | ICD-10-CM | POA: Diagnosis not present

## 2020-09-04 DIAGNOSIS — Z992 Dependence on renal dialysis: Secondary | ICD-10-CM | POA: Diagnosis not present

## 2020-09-04 DIAGNOSIS — N186 End stage renal disease: Secondary | ICD-10-CM | POA: Diagnosis not present

## 2020-09-08 DIAGNOSIS — N2581 Secondary hyperparathyroidism of renal origin: Secondary | ICD-10-CM | POA: Diagnosis not present

## 2020-09-08 DIAGNOSIS — N186 End stage renal disease: Secondary | ICD-10-CM | POA: Diagnosis not present

## 2020-09-08 DIAGNOSIS — D631 Anemia in chronic kidney disease: Secondary | ICD-10-CM | POA: Diagnosis not present

## 2020-09-08 DIAGNOSIS — D509 Iron deficiency anemia, unspecified: Secondary | ICD-10-CM | POA: Diagnosis not present

## 2020-09-08 DIAGNOSIS — Z992 Dependence on renal dialysis: Secondary | ICD-10-CM | POA: Diagnosis not present

## 2020-09-10 DIAGNOSIS — D631 Anemia in chronic kidney disease: Secondary | ICD-10-CM | POA: Diagnosis not present

## 2020-09-10 DIAGNOSIS — N186 End stage renal disease: Secondary | ICD-10-CM | POA: Diagnosis not present

## 2020-09-10 DIAGNOSIS — Z992 Dependence on renal dialysis: Secondary | ICD-10-CM | POA: Diagnosis not present

## 2020-09-10 DIAGNOSIS — D509 Iron deficiency anemia, unspecified: Secondary | ICD-10-CM | POA: Diagnosis not present

## 2020-09-10 DIAGNOSIS — N2581 Secondary hyperparathyroidism of renal origin: Secondary | ICD-10-CM | POA: Diagnosis not present

## 2020-09-11 DIAGNOSIS — E1122 Type 2 diabetes mellitus with diabetic chronic kidney disease: Secondary | ICD-10-CM | POA: Diagnosis not present

## 2020-09-11 DIAGNOSIS — N186 End stage renal disease: Secondary | ICD-10-CM | POA: Diagnosis not present

## 2020-09-11 DIAGNOSIS — Z992 Dependence on renal dialysis: Secondary | ICD-10-CM | POA: Diagnosis not present

## 2020-09-12 DIAGNOSIS — D509 Iron deficiency anemia, unspecified: Secondary | ICD-10-CM | POA: Diagnosis not present

## 2020-09-12 DIAGNOSIS — L299 Pruritus, unspecified: Secondary | ICD-10-CM | POA: Diagnosis not present

## 2020-09-12 DIAGNOSIS — D631 Anemia in chronic kidney disease: Secondary | ICD-10-CM | POA: Diagnosis not present

## 2020-09-12 DIAGNOSIS — N2581 Secondary hyperparathyroidism of renal origin: Secondary | ICD-10-CM | POA: Diagnosis not present

## 2020-09-12 DIAGNOSIS — Z992 Dependence on renal dialysis: Secondary | ICD-10-CM | POA: Diagnosis not present

## 2020-09-12 DIAGNOSIS — N186 End stage renal disease: Secondary | ICD-10-CM | POA: Diagnosis not present

## 2020-09-15 DIAGNOSIS — D509 Iron deficiency anemia, unspecified: Secondary | ICD-10-CM | POA: Diagnosis not present

## 2020-09-15 DIAGNOSIS — N186 End stage renal disease: Secondary | ICD-10-CM | POA: Diagnosis not present

## 2020-09-15 DIAGNOSIS — D631 Anemia in chronic kidney disease: Secondary | ICD-10-CM | POA: Diagnosis not present

## 2020-09-15 DIAGNOSIS — L299 Pruritus, unspecified: Secondary | ICD-10-CM | POA: Diagnosis not present

## 2020-09-15 DIAGNOSIS — Z992 Dependence on renal dialysis: Secondary | ICD-10-CM | POA: Diagnosis not present

## 2020-09-15 DIAGNOSIS — N2581 Secondary hyperparathyroidism of renal origin: Secondary | ICD-10-CM | POA: Diagnosis not present

## 2020-09-16 ENCOUNTER — Ambulatory Visit
Admission: RE | Admit: 2020-09-16 | Discharge: 2020-09-16 | Disposition: A | Discharge status: Home / Self Care | Payer: Medicare Other | Source: Ambulatory Visit | Attending: Family Medicine | Admitting: Family Medicine

## 2020-09-16 ENCOUNTER — Other Ambulatory Visit: Payer: Self-pay

## 2020-09-16 DIAGNOSIS — Z1231 Encounter for screening mammogram for malignant neoplasm of breast: Secondary | ICD-10-CM | POA: Diagnosis not present

## 2020-09-17 DIAGNOSIS — D631 Anemia in chronic kidney disease: Secondary | ICD-10-CM | POA: Diagnosis not present

## 2020-09-17 DIAGNOSIS — N186 End stage renal disease: Secondary | ICD-10-CM | POA: Diagnosis not present

## 2020-09-17 DIAGNOSIS — Z992 Dependence on renal dialysis: Secondary | ICD-10-CM | POA: Diagnosis not present

## 2020-09-17 DIAGNOSIS — N2581 Secondary hyperparathyroidism of renal origin: Secondary | ICD-10-CM | POA: Diagnosis not present

## 2020-09-17 DIAGNOSIS — D509 Iron deficiency anemia, unspecified: Secondary | ICD-10-CM | POA: Diagnosis not present

## 2020-09-17 DIAGNOSIS — L299 Pruritus, unspecified: Secondary | ICD-10-CM | POA: Diagnosis not present

## 2020-09-19 DIAGNOSIS — L299 Pruritus, unspecified: Secondary | ICD-10-CM | POA: Diagnosis not present

## 2020-09-19 DIAGNOSIS — Z992 Dependence on renal dialysis: Secondary | ICD-10-CM | POA: Diagnosis not present

## 2020-09-19 DIAGNOSIS — D631 Anemia in chronic kidney disease: Secondary | ICD-10-CM | POA: Diagnosis not present

## 2020-09-19 DIAGNOSIS — N2581 Secondary hyperparathyroidism of renal origin: Secondary | ICD-10-CM | POA: Diagnosis not present

## 2020-09-19 DIAGNOSIS — D509 Iron deficiency anemia, unspecified: Secondary | ICD-10-CM | POA: Diagnosis not present

## 2020-09-19 DIAGNOSIS — N186 End stage renal disease: Secondary | ICD-10-CM | POA: Diagnosis not present

## 2020-09-22 DIAGNOSIS — D509 Iron deficiency anemia, unspecified: Secondary | ICD-10-CM | POA: Diagnosis not present

## 2020-09-22 DIAGNOSIS — L299 Pruritus, unspecified: Secondary | ICD-10-CM | POA: Diagnosis not present

## 2020-09-22 DIAGNOSIS — N186 End stage renal disease: Secondary | ICD-10-CM | POA: Diagnosis not present

## 2020-09-22 DIAGNOSIS — D631 Anemia in chronic kidney disease: Secondary | ICD-10-CM | POA: Diagnosis not present

## 2020-09-22 DIAGNOSIS — N2581 Secondary hyperparathyroidism of renal origin: Secondary | ICD-10-CM | POA: Diagnosis not present

## 2020-09-22 DIAGNOSIS — Z992 Dependence on renal dialysis: Secondary | ICD-10-CM | POA: Diagnosis not present

## 2020-09-23 DIAGNOSIS — N189 Chronic kidney disease, unspecified: Secondary | ICD-10-CM | POA: Diagnosis not present

## 2020-09-23 DIAGNOSIS — Z01818 Encounter for other preprocedural examination: Secondary | ICD-10-CM | POA: Diagnosis not present

## 2020-09-24 DIAGNOSIS — D509 Iron deficiency anemia, unspecified: Secondary | ICD-10-CM | POA: Diagnosis not present

## 2020-09-24 DIAGNOSIS — N186 End stage renal disease: Secondary | ICD-10-CM | POA: Diagnosis not present

## 2020-09-24 DIAGNOSIS — N2581 Secondary hyperparathyroidism of renal origin: Secondary | ICD-10-CM | POA: Diagnosis not present

## 2020-09-24 DIAGNOSIS — D631 Anemia in chronic kidney disease: Secondary | ICD-10-CM | POA: Diagnosis not present

## 2020-09-24 DIAGNOSIS — L299 Pruritus, unspecified: Secondary | ICD-10-CM | POA: Diagnosis not present

## 2020-09-24 DIAGNOSIS — Z992 Dependence on renal dialysis: Secondary | ICD-10-CM | POA: Diagnosis not present

## 2020-09-26 DIAGNOSIS — D631 Anemia in chronic kidney disease: Secondary | ICD-10-CM | POA: Diagnosis not present

## 2020-09-26 DIAGNOSIS — N2581 Secondary hyperparathyroidism of renal origin: Secondary | ICD-10-CM | POA: Diagnosis not present

## 2020-09-26 DIAGNOSIS — N186 End stage renal disease: Secondary | ICD-10-CM | POA: Diagnosis not present

## 2020-09-26 DIAGNOSIS — D509 Iron deficiency anemia, unspecified: Secondary | ICD-10-CM | POA: Diagnosis not present

## 2020-09-26 DIAGNOSIS — Z992 Dependence on renal dialysis: Secondary | ICD-10-CM | POA: Diagnosis not present

## 2020-09-26 DIAGNOSIS — L299 Pruritus, unspecified: Secondary | ICD-10-CM | POA: Diagnosis not present

## 2020-09-29 ENCOUNTER — Ambulatory Visit: Payer: Self-pay | Admitting: *Deleted

## 2020-09-29 DIAGNOSIS — N2581 Secondary hyperparathyroidism of renal origin: Secondary | ICD-10-CM | POA: Diagnosis not present

## 2020-09-29 DIAGNOSIS — K2100 Gastro-esophageal reflux disease with esophagitis, without bleeding: Secondary | ICD-10-CM | POA: Diagnosis not present

## 2020-09-29 DIAGNOSIS — K5904 Chronic idiopathic constipation: Secondary | ICD-10-CM | POA: Diagnosis not present

## 2020-09-29 DIAGNOSIS — Z79899 Other long term (current) drug therapy: Secondary | ICD-10-CM | POA: Diagnosis not present

## 2020-09-29 DIAGNOSIS — D631 Anemia in chronic kidney disease: Secondary | ICD-10-CM | POA: Diagnosis not present

## 2020-09-29 DIAGNOSIS — N186 End stage renal disease: Secondary | ICD-10-CM | POA: Diagnosis not present

## 2020-09-29 DIAGNOSIS — R112 Nausea with vomiting, unspecified: Secondary | ICD-10-CM | POA: Diagnosis not present

## 2020-09-29 DIAGNOSIS — R1312 Dysphagia, oropharyngeal phase: Secondary | ICD-10-CM | POA: Diagnosis not present

## 2020-09-29 DIAGNOSIS — Z992 Dependence on renal dialysis: Secondary | ICD-10-CM | POA: Diagnosis not present

## 2020-09-29 DIAGNOSIS — R110 Nausea: Secondary | ICD-10-CM | POA: Diagnosis not present

## 2020-09-29 DIAGNOSIS — K219 Gastro-esophageal reflux disease without esophagitis: Secondary | ICD-10-CM | POA: Diagnosis not present

## 2020-09-29 DIAGNOSIS — D509 Iron deficiency anemia, unspecified: Secondary | ICD-10-CM | POA: Diagnosis not present

## 2020-09-29 DIAGNOSIS — L299 Pruritus, unspecified: Secondary | ICD-10-CM | POA: Diagnosis not present

## 2020-09-29 DIAGNOSIS — Z1211 Encounter for screening for malignant neoplasm of colon: Secondary | ICD-10-CM | POA: Diagnosis not present

## 2020-09-29 DIAGNOSIS — K5900 Constipation, unspecified: Secondary | ICD-10-CM | POA: Diagnosis not present

## 2020-09-29 NOTE — Telephone Encounter (Signed)
C/o rash to face, chin, nose, cheeks, forehead and hands x 2 weeks and getting worse. Itching worse. "Looks like tiny pimples ". No swelling, no fever, no other areas of body. Denies redness. Has tried benadryl , hydrocortisone cream, antihistamines and no relief. No appt available this week. Patient is requesting anything to help  treat rash. Care advise given. Patient verbalized understanding of care advise and to call back or go to [facility] or ED if symptoms worsen. Please advise.

## 2020-09-29 NOTE — Telephone Encounter (Signed)
Reason for Disposition  Localized rash present > 7 days  Answer Assessment - Initial Assessment Questions 1.) CALLER DIAGNOSIS: "What do you think is causing the rash?" (e.g., athlete's foot, chickenpox, hives, impetigo) 2.) LOCALIZED OR WIDESPREAD:  "Is the rash all over (widespread) or mostly just in one area of the body (localized)?"  3.) NEW MEDICINES: "Are you taking any new medicine?" 4.) APPEARANCE of RASH: "Describe the rash. What color is it?" (Note: It is difficult to assess rash color in people with darker-colored skin. When this situation occurs, simply ask the caller to describe what they see.) *No Answer*  Answer Assessment - Initial Assessment Questions 1. APPEARANCE of RASH: "Describe the rash."      Looks like tiny pimples all over face  2. LOCATION: "Where is the rash located?"      Face, chin, cheeks, nose, forehead, hands. 3. NUMBER: "How many spots are there?"      Tiny spots all over 4. SIZE: "How big are the spots?" (Inches, centimeters or compare to size of a coin)      Tiny spots  5. ONSET: "When did the rash start?"      2 weeks ago and getting worse 6. ITCHING: "Does the rash itch?" If Yes, ask: "How bad is the itch?"  (Scale 0-10; or none, mild, moderate, severe)     Yes itching.  "Terrible"  7. PAIN: "Does the rash hurt?" If Yes, ask: "How bad is the pain?"  (Scale 0-10; or none, mild, moderate, severe)    - NONE (0): no pain    - MILD (1-3): doesn't interfere with normal activities     - MODERATE (4-7): interferes with normal activities or awakens from sleep     - SEVERE (8-10): excruciating pain, unable to do any normal activities     na 8. OTHER SYMPTOMS: "Do you have any other symptoms?" (e.g., fever)     No  9. PREGNANCY: "Is there any chance you are pregnant?" "When was your last menstrual period?"     na  Protocols used: Rash - Guideline Selection-A-AH, Rash or Redness - Localized-A-AH

## 2020-09-30 ENCOUNTER — Other Ambulatory Visit: Payer: Self-pay

## 2020-09-30 ENCOUNTER — Telehealth: Payer: Medicare Other | Admitting: Nurse Practitioner

## 2020-09-30 ENCOUNTER — Telehealth: Payer: Self-pay | Admitting: Nurse Practitioner

## 2020-09-30 NOTE — Telephone Encounter (Signed)
Called patient for virtual visit. Patient states that she would like to reschedule because she is currently on the phone with a co-worker.

## 2020-09-30 NOTE — Telephone Encounter (Signed)
Scheduled virtual appt today with Lazaro Arms, NP.

## 2020-10-01 ENCOUNTER — Other Ambulatory Visit: Payer: Self-pay

## 2020-10-01 ENCOUNTER — Telehealth: Payer: Medicare Other | Admitting: Nurse Practitioner

## 2020-10-01 DIAGNOSIS — D509 Iron deficiency anemia, unspecified: Secondary | ICD-10-CM | POA: Diagnosis not present

## 2020-10-01 DIAGNOSIS — Z992 Dependence on renal dialysis: Secondary | ICD-10-CM | POA: Diagnosis not present

## 2020-10-01 DIAGNOSIS — N186 End stage renal disease: Secondary | ICD-10-CM | POA: Diagnosis not present

## 2020-10-01 DIAGNOSIS — L299 Pruritus, unspecified: Secondary | ICD-10-CM | POA: Diagnosis not present

## 2020-10-01 DIAGNOSIS — D631 Anemia in chronic kidney disease: Secondary | ICD-10-CM | POA: Diagnosis not present

## 2020-10-01 DIAGNOSIS — N2581 Secondary hyperparathyroidism of renal origin: Secondary | ICD-10-CM | POA: Diagnosis not present

## 2020-10-03 DIAGNOSIS — D509 Iron deficiency anemia, unspecified: Secondary | ICD-10-CM | POA: Diagnosis not present

## 2020-10-03 DIAGNOSIS — N2581 Secondary hyperparathyroidism of renal origin: Secondary | ICD-10-CM | POA: Diagnosis not present

## 2020-10-03 DIAGNOSIS — D631 Anemia in chronic kidney disease: Secondary | ICD-10-CM | POA: Diagnosis not present

## 2020-10-03 DIAGNOSIS — Z992 Dependence on renal dialysis: Secondary | ICD-10-CM | POA: Diagnosis not present

## 2020-10-03 DIAGNOSIS — N186 End stage renal disease: Secondary | ICD-10-CM | POA: Diagnosis not present

## 2020-10-03 DIAGNOSIS — L299 Pruritus, unspecified: Secondary | ICD-10-CM | POA: Diagnosis not present

## 2020-10-05 ENCOUNTER — Ambulatory Visit (HOSPITAL_BASED_OUTPATIENT_CLINIC_OR_DEPARTMENT_OTHER): Payer: Medicare Other

## 2020-10-05 DIAGNOSIS — Z0000 Encounter for general adult medical examination without abnormal findings: Secondary | ICD-10-CM

## 2020-10-05 NOTE — Progress Notes (Addendum)
Subjective:   Susan Horton is a 50 y.o. female who presents for an Initial Medicare Annual Wellness Visit.  I connected with  [name] on 10/05/20 by a audio enabled telemedicine application and verified that I am speaking with the correct person using two identifiers.   Location of patient: Home Location of provider: Office  Persons participating in visit Susan Horton (patient) Loralyn Freshwater RMA   I discussed the limitations of evaluation and management by telemedicine. The patient expressed understanding and agreed to proceed.   Review of Systems    Defer to PCP       Objective:    There were no vitals filed for this visit. There is no height or weight on file to calculate BMI.  Advanced Directives 06/29/2020 07/18/2019 04/25/2019 03/02/2019 02/25/2019 01/11/2019 01/10/2019  Does Patient Have a Medical Advance Directive? No No No Yes Yes No No  Type of Advance Directive - - - - Press photographer - -  Does patient want to make changes to medical advance directive? - - - - No - Patient declined - -  Copy of [place] in Chart? - - - - No - copy requested - -  Would patient like information on creating a medical advance directive? No - Patient declined No - Patient declined No - Patient declined - - No - Patient declined No - Patient declined    Current Medications (verified) Outpatient Encounter Medications as of 10/05/2020  Medication Sig   albuterol (VENTOLIN HFA) 108 (90 Base) MCG/ACT inhaler Inhale 1-2 puffs into the lungs every 6 (six) hours as needed for wheezing or shortness of breath.   amitriptyline (ELAVIL) 10 MG tablet Take 1 tablet (10 mg total) by mouth at bedtime.   amLODipine (NORVASC) 10 MG tablet TAKE 1 TABLET BY MOUTH EVERY DAY   aspirin 81 MG chewable tablet Chew 1 tablet (81 mg total) by mouth daily.   atorvastatin (LIPITOR) 40 MG tablet TAKE 1 TABLET BY MOUTH EVERY DAY   B Complex-C-Zn-Folic Acid (DIALYVITE/ZINC) TABS Take  1 tablet by mouth daily.   calcitRIOL (ROCALTROL) 0.5 MCG capsule Take 1.5 mcg by mouth daily.   calcium acetate (PHOSLO) 667 MG capsule Take 667 mg by mouth 3 (three) times daily with meals.   clopidogrel (PLAVIX) 75 MG tablet TAKE 1 TABLET (75 MG TOTAL) BY MOUTH DAILY WITH BREAKFAST.   cyclobenzaprine (FLEXERIL) 10 MG tablet Take 1 tablet (10 mg total) by mouth 2 (two) times daily as needed.   Fluticasone-Salmeterol (ADVAIR) 250-50 MCG/DOSE AEPB Inhale 1 puff into the lungs 2 (two) times daily.   hydrALAZINE (APRESOLINE) 50 MG tablet Take 50 mg by mouth every 8 (eight) hours.   ipratropium-albuterol (DUONEB) 0.5-2.5 (3) MG/3ML SOLN Take 3 mLs by nebulization every 6 (six) hours as needed.   iron sucrose (VENOFER) 20 MG/ML injection Iron Sucrose (Venofer)   isosorbide mononitrate (IMDUR) 30 MG 24 hr tablet TAKE 0.5 TABLETS (15 MG TOTAL) BY MOUTH DAILY.   lidocaine-prilocaine (EMLA) cream Apply 1 application topically as needed.   Methoxy PEG-Epoetin Beta (MIRCERA IJ) Inject into the skin.   metoCLOPramide (REGLAN) 5 MG tablet Take 2 tablets (10 mg total) by mouth 3 (three) times daily before meals for 7 days, THEN 1 tablet (5 mg total) every 8 (eight) hours as needed for nausea or vomiting.   metoprolol succinate (TOPROL-XL) 50 MG 24 hr tablet TAKE 1.5 TABLETS BY MOUTH DAILY   montelukast (SINGULAIR) 10 MG tablet  Take 1 tablet (10 mg total) by mouth at bedtime.   nitroGLYCERIN (NITROSTAT) 0.4 MG SL tablet Place 1 tablet (0.4 mg total) under the tongue every 5 (five) minutes as needed for chest pain.   ondansetron (ZOFRAN ODT) 4 MG disintegrating tablet Take 1 tablet (4 mg total) by mouth every 8 (eight) hours as needed for nausea or vomiting.   pantoprazole (PROTONIX) 40 MG tablet Take 1 tablet (40 mg total) by mouth daily.   PRALUENT 75 MG/ML SOAJ INJECT 75 MG INTO THE SKIN EVERY 14 (FOURTEEN) DAYS.   sucralfate (CARAFATE) 1 GM/10ML suspension Take 10 mLs (1 g total) by mouth 4 (four) times  daily -  with meals and at bedtime.   trimethoprim-polymyxin b (POLYTRIM) ophthalmic solution Place 1 drop into both eyes See admin instructions. Instill one drop into both eyes four times daily for 2 days after each monthly eye injection   No facility-administered encounter medications on file as of 10/05/2020.    Allergies (verified) Penicillins, Repatha [evolocumab], and Lisinopril   History: Past Medical History:  Diagnosis Date   Abnormal stress test    Anemia    Angio-edema    Asthma    CAD (coronary artery disease)    DES to mid LAD July 2018, residual moderate RCA disease   Cataract    CKD (chronic kidney disease) stage 4, GFR 15-29 ml/min (HCC)    Dialysis T/Th/Sa   DVT (deep venous thrombosis) ([place])    1996 during pregnancy, 2015 left leg   Eczema    Essential hypertension 12/11/2015   Gastroparesis    GERD (gastroesophageal reflux disease)    Gluteal abscess 12/27/2018   Hidradenitis    Migraine    Neuropathy    Peripheral vascular disease (HCC)    blood clot in leg   Progressive angina ([place]) 06/05/2014   Chest pain   Type 2 diabetes mellitus ([place])    Type II diabetes mellitus ([place])    Urticaria    Past Surgical History:  Procedure Laterality Date   A/V FISTULAGRAM N/A 06/22/2017   Procedure: A/V FISTULAGRAM - left arm;  Surgeon: [name], MD;  Location: [facility];  Service: Cardiovascular;  Laterality: N/A;   ABDOMINAL AORTOGRAM W/LOWER EXTREMITY Bilateral 12/14/2018   Procedure: ABDOMINAL AORTOGRAM W/LOWER EXTREMITY;  Surgeon: [name], MD;  Location: [facility];  Service: Cardiovascular;  Laterality: Bilateral;   ABDOMINAL AORTOGRAM W/LOWER EXTREMITY N/A 04/25/2019   Procedure: ABDOMINAL AORTOGRAM W/LOWER EXTREMITY;  Surgeon: [name], MD;  Location: [facility];  Service: Cardiovascular;  Laterality: N/A;   ABDOMINAL AORTOGRAM W/LOWER EXTREMITY Left 07/18/2019   Procedure: ABDOMINAL AORTOGRAM  W/LOWER EXTREMITY;  Surgeon: [name], MD;  Location: [facility];  Service: Cardiovascular;  Laterality: Left;   ABDOMINOPLASTY     ADENOIDECTOMY     APPENDECTOMY  1995   AV FISTULA PLACEMENT Left 02/01/2017   Procedure: ARTERIOVENOUS BRACHIOCEPHALIC (AV) FISTULA CREATION;  Surgeon: [name], MD;  Location: [facility];  Service: Vascular;  Laterality: Left;   [place] INTERVENTION N/A 08/05/2016   Procedure: Coronary Stent Intervention;  Surgeon: [name], MD;  Location: [facility];  Service: Cardiovascular;  Laterality: N/A;   ESOPHAGOGASTRODUODENOSCOPY (EGD) WITH PROPOFOL N/A 07/01/2020   Procedure: ESOPHAGOGASTRODUODENOSCOPY (EGD) WITH PROPOFOL;  Surgeon: Gatha Mayer, MD;  Location: [facility];  Service: Endoscopy;  Laterality: N/A;   EXPLORATORY LAPAROTOMY  08/14/2005   lysis of adhesions, drainage of tubo-ovarian abscess   FISTULA SUPERFICIALIZATION Left 09/14/2017   Procedure: FISTULA SUPERFICIALIZATION ARTERIOVENOUS FISTULA LEFT ARM;  Surgeon: [name], MD;  Location: [facility];  Service: Vascular;  Laterality: Left;   FLEXIBLE SIGMOIDOSCOPY N/A 01/04/2019   Procedure: FLEXIBLE SIGMOIDOSCOPY;  Surgeon: Clarene Essex, MD;  Location: [facility];  Service: Endoscopy;  Laterality: N/A;   HYDRADENITIS EXCISION  02/08/2011   Procedure: EXCISION HYDRADENITIS GROIN;  Surgeon: [name], MD;  Location: [facility];  Service: General;  Laterality: N/A;  Excisioin of Hidradenitis Left groin   INCISION AND DRAINAGE ABSCESS N/A 01/11/2019   Procedure: INCISION AND DRAINAGE SACRAL ABSCESS;  Surgeon: Georganna Skeans, MD;  Location: [facility];  Service: General;  Laterality: N/A;   INGUINAL HIDRADENITIS EXCISION  07/06/2010   bilateral   INSERTION OF DIALYSIS CATHETER N/A 09/14/2017   Procedure: INSERTION OF TUNNELED DIALYSIS CATHETER Right Internal Jugular;   Surgeon: [name], MD;  Location: [facility];  Service: Vascular;  Laterality: N/A;   IR FLUORO GUIDE CV LINE RIGHT  03/01/2019   IR US GUIDE BX ASP/DRAIN  01/01/2019   IR US GUIDE VASC ACCESS RIGHT  03/01/2019   LEFT HEART CATH AND CORONARY ANGIOGRAPHY N/A 08/05/2016   Procedure: Left Heart Cath and Coronary Angiography;  Surgeon: [name], MD;  Location: [facility];  Service: Cardiovascular;  Laterality: N/A;   PERIPHERAL VASCULAR INTERVENTION Left 12/14/2018   Procedure: PERIPHERAL VASCULAR INTERVENTION;  Surgeon: [name], MD;  Location: [facility];  Service: Cardiovascular;  Laterality: Left;  SFA   PERIPHERAL VASCULAR INTERVENTION Left 04/25/2019   Procedure: PERIPHERAL VASCULAR INTERVENTION;  Surgeon: [name], MD;  Location: [facility];  Service: Cardiovascular;  Laterality: Left;  right superficial femoral, and left iliac   PERIPHERAL VASCULAR INTERVENTION Left 07/18/2019   Procedure: PERIPHERAL VASCULAR INTERVENTION;  Surgeon: [name], MD;  Location: [facility];  Service: Cardiovascular;  Laterality: Left;  external iliac   REDUCTION MAMMAPLASTY  2002   TONSILLECTOMY     TUBAL LIGATION  1996   VAGINAL HYSTERECTOMY  08/04/2005   and cysto   Family History  Problem Relation Age of Onset   Allergic rhinitis Mother    Hypertension Father    Allergic rhinitis Father    Hypertension Sister    Allergic rhinitis Sister    Hypertension Brother    Allergic rhinitis Brother    Breast cancer Cousin        x2   Social History   Socioeconomic History   Marital status: Single    Spouse name: Not on file   Number of children: 3   Years of education: 14   Highest education level: Not on file  Occupational History   Occupation: Curator  Tobacco Use   Smoking status: Former    Years: 20.00    Types: Cigarettes    Quit date: 11/2016    Years since quitting: 3.9   Smokeless tobacco: Never   Vaping Use   Vaping Use: Never used  Substance and Sexual Activity   Alcohol use: No    Alcohol/week: 0.0 standard drinks   Drug use: Yes    Frequency: 2.0 times per week    Types: Marijuana   Sexual activity: Not on file  Other Topics Concern   Not on file  Social History Narrative   Lives  with daughter in a 2 story home.  Has 3 children.  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  Education: college.    Social Determinants of Health   Financial Resource Strain: Not on file  Food Insecurity: Not on file  Transportation Needs: Not on file  Physical Activity: Not on file  Stress: Not on file  Social Connections: Not on file    Tobacco Counseling Counseling given: Not Answered   Clinical Intake:                 Diabetic?Yes         Activities of Daily Living In your present state of health, do you have any difficulty performing the following activities: 06/29/2020  Hearing? N  Vision? N  Difficulty concentrating or making decisions? N  Walking or climbing stairs? N  Dressing or bathing? N  Doing errands, shopping? N  Some recent data might be hidden    Patient Care Team: Charlott Rakes, MD as PCP - General (Family Medicine) [name], MD as PCP - Cardiology (Cardiology) [place], Jeneen Rinks, MD as Consulting Physician (Nephrology) [place] any recent Medical Services you may have received from other than [facility] providers in the past year (date may be approximate).     Assessment:   This is a routine wellness examination for Dajah.  Hearing/Vision screen No results found.  Dietary issues and exercise activities discussed:     Goals Addressed   None   Depression Screen PHQ 2/9 Scores 01/26/2019 03/22/2018 10/05/2017 09/07/2017 06/10/2017 03/10/2016 01/21/2016  PHQ - 2 Score 0 1 0 0 2 3 3   PHQ- 9 Score - 4 - 3 13 10 11     Fall Risk Fall Risk  02/05/2019 01/26/2019 01/24/2019 03/29/2018 03/22/2018   Falls in the past year? 1 0 0 0 0  Number falls in past yr: 1 - - 0 -  Injury with Fall? 0 - - 0 -  Risk Factor Category  - - - - -  Risk for fall due to : Impaired balance/gait;Impaired mobility Impaired balance/gait - - -  Follow up Falls evaluation completed Falls evaluation completed - Falls evaluation completed -    FALL RISK PREVENTION PERTAINING TO THE HOME:  Any stairs in or around the home? No  If so, are there any without handrails? No  Home free of loose throw rugs in walkways, pet beds, electrical cords, etc? Yes  Adequate lighting in your home to reduce risk of falls? Yes   ASSISTIVE DEVICES UTILIZED TO PREVENT FALLS:  Life alert? No  Use of a cane, walker or w/c? No  Grab bars in the bathroom? No  Shower chair or bench in shower? No  Elevated toilet seat or a handicapped toilet? No   TIMED UP AND GO:  Was the test performed? No .  Length of time to ambulate 10 feet: N/A sec.     Cognitive Function:        Immunizations Immunization History  Administered Date(s) Administered   Hepatitis B, adult 07/21/2017, 08/25/2017, 09/22/2017    TDAP status: Due, Education has been provided regarding the importance of this vaccine. Advised may receive this vaccine at local pharmacy or Health Dept. Aware to provide a copy of the vaccination record if obtained from local pharmacy or Health Dept. Verbalized acceptance and understanding.  Flu Vaccine status: Due, Education has been provided regarding the importance of this vaccine. Advised may receive this vaccine at local pharmacy  or Health Dept. Aware to provide a copy of the vaccination record if obtained from local pharmacy or Health Dept. Verbalized acceptance and understanding.  Pneumococcal vaccine status: Due, Education has been provided regarding the importance of this vaccine. Advised may receive this vaccine at local pharmacy or Health Dept. Aware to provide a copy of the vaccination record if obtained from local  pharmacy or Health Dept. Verbalized acceptance and understanding.  Covid-19 vaccine status: Information provided on how to obtain vaccines.   Qualifies for Shingles Vaccine? No   Zostavax completed No   Shingrix Completed?: No.    Education has been provided regarding the importance of this vaccine. Patient has been advised to call insurance company to determine out of pocket expense if they have not yet received this vaccine. Advised may also receive vaccine at local pharmacy or Health Dept. Verbalized acceptance and understanding.  Screening Tests Health Maintenance  Topic Date Due   COVID-19 Vaccine (1) Never done   FOOT EXAM  Never done   TETANUS/TDAP  Never done   Zoster Vaccines- Shingrix (1 of 2) Never done   COLONOSCOPY (Pts 45-63yrs Insurance coverage will need to be confirmed)  Never done   OPHTHALMOLOGY EXAM  02/10/2017   INFLUENZA VACCINE  Never done   HEMOGLOBIN A1C  12/30/2020   MAMMOGRAM  09/17/2022   Hepatitis C Screening  Completed   HIV Screening  Completed   HPV VACCINES  Aged Out   PAP SMEAR-Modifier  Discontinued    Health Maintenance  Health Maintenance Due  Topic Date Due   COVID-19 Vaccine (1) Never done   FOOT EXAM  Never done   TETANUS/TDAP  Never done   Zoster Vaccines- Shingrix (1 of 2) Never done   COLONOSCOPY (Pts 45-29yrs Insurance coverage will need to be confirmed)  Never done   OPHTHALMOLOGY EXAM  02/10/2017   INFLUENZA VACCINE  Never done    Colorectal cancer screening: Referral to GI placed Unknown. Pt aware the office will call re: appt.  Mammogram status: Completed 09/16/20. Repeat every year  Bone Density status: Ordered Declined. Pt provided with contact info and advised to call to schedule appt.  Lung Cancer Screening: (Low Dose CT Chest recommended if Age 83-80 years, 30 pack-year currently smoking OR have quit w/in 15years.) does not qualify.   Lung Cancer Screening Referral: N/A  Additional Screening:  Hepatitis C  Screening: does qualify; Completed 12/30/18  Vision Screening: Recommended annual ophthalmology exams for early detection of glaucoma and other disorders of the eye. Is the patient up to date with their annual eye exam?  No  Who is the provider or what is the name of the office in which the patient attends annual eye exams? Unknown If pt is not established with a provider, would they like to be referred to a provider to establish care? No .   Dental Screening: Recommended annual dental exams for proper oral hygiene  Community Resource Referral / Chronic Care Management: CRR required this visit?  No   CCM required this visit?  No      Plan:     I have personally reviewed and noted the following in the patient's chart:   Medical and social history Use of alcohol, tobacco or illicit drugs  Current medications and supplements including opioid prescriptions. Patient is not currently taking opioid prescriptions. Functional ability and status Nutritional status Physical activity Advanced directives List of other physicians Hospitalizations, surgeries, and ER visits in previous 12 months Vitals Screenings to include  cognitive, depression, and falls Referrals and appointments  In addition, I have reviewed and discussed with patient certain preventive protocols, quality metrics, and best practice recommendations. A written personalized care plan for preventive services as well as general preventive health recommendations were provided to patient.     Loralyn Freshwater, [facility]   10/05/2020   Nurse Notes: Non Face to Face 30    Ms. Ronnald Ramp , Thank you for taking time to come for your Medicare Wellness Visit. I appreciate your ongoing commitment to your health goals. Please review the following plan we discussed and let me know if I can assist you in the future.   These are the goals we discussed:  Goals   None     This is a list of the screening recommended for you and due dates:   Health Maintenance  Topic Date Due   Complete foot exam   Never done   Tetanus Vaccine  Never done   Colon Cancer Screening  Never done   Eye exam for diabetics  02/10/2017   Flu Shot  Never done   Hemoglobin A1C  12/30/2020   Mammogram  09/17/2022   Hepatitis C Screening: USPSTF Recommendation to screen - Ages 18-79 yo.  Completed   HIV Screening  Completed   HPV Vaccine  Aged Out   Pap Smear  Discontinued   COVID-19 Vaccine  Discontinued   Zoster (Shingles) Vaccine  Discontinued

## 2020-10-05 NOTE — Patient Instructions (Signed)
Health Maintenance, Female Adopting a healthy lifestyle and getting preventive care are important in promoting health and wellness. Ask your health care provider about: The right schedule for you to have regular tests and exams. Things you can do on your own to prevent diseases and keep yourself healthy. What should I know about diet, weight, and exercise? Eat a healthy diet  Eat a diet that includes plenty of vegetables, fruits, low-fat dairy products, and lean protein. Do not eat a lot of foods that are high in solid fats, added sugars, or sodium. Maintain a healthy weight Body mass index (BMI) is used to identify weight problems. It estimates body fat based on height and weight. Your health care provider can help determine your BMI and help you achieve or maintain a healthy weight. Get regular exercise Get regular exercise. This is one of the most important things you can do for your health. Most adults should: Exercise for at least 150 minutes each week. The exercise should increase your heart rate and make you sweat (moderate-intensity exercise). Do strengthening exercises at least twice a week. This is in addition to the moderate-intensity exercise. Spend less time sitting. Even light physical activity can be beneficial. Watch cholesterol and blood lipids Have your blood tested for lipids and cholesterol at 50 years of age, then have this test every 5 years. Have your cholesterol levels checked more often if: Your lipid or cholesterol levels are high. You are older than 50 years of age. You are at high risk for heart disease. What should I know about cancer screening? Depending on your health history and family history, you may need to have cancer screening at various ages. This may include screening for: Breast cancer. Cervical cancer. Colorectal cancer. Skin cancer. Lung cancer. What should I know about heart disease, diabetes, and high blood pressure? Blood pressure and heart  disease High blood pressure causes heart disease and increases the risk of stroke. This is more likely to develop in people who have high blood pressure readings, are of African descent, or are overweight. Have your blood pressure checked: Every 3-5 years if you are 18-39 years of age. Every year if you are 40 years old or older. Diabetes Have regular diabetes screenings. This checks your fasting blood sugar level. Have the screening done: Once every three years after age 40 if you are at a normal weight and have a low risk for diabetes. More often and at a younger age if you are overweight or have a high risk for diabetes. What should I know about preventing infection? Hepatitis B If you have a higher risk for hepatitis B, you should be screened for this virus. Talk with your health care provider to find out if you are at risk for hepatitis B infection. Hepatitis C Testing is recommended for: Everyone born from 1945 through 1965. Anyone with known risk factors for hepatitis C. Sexually transmitted infections (STIs) Get screened for STIs, including gonorrhea and chlamydia, if: You are sexually active and are younger than 50 years of age. You are older than 50 years of age and your health care provider tells you that you are at risk for this type of infection. Your sexual activity has changed since you were last screened, and you are at increased risk for chlamydia or gonorrhea. Ask your health care provider if you are at risk. Ask your health care provider about whether you are at high risk for HIV. Your health care provider may recommend a prescription medicine   to help prevent HIV infection. If you choose to take medicine to prevent HIV, you should first get tested for HIV. You should then be tested every 3 months for as long as you are taking the medicine. Pregnancy If you are about to stop having your period (premenopausal) and you may become pregnant, seek counseling before you get  pregnant. Take 400 to 800 micrograms (mcg) of folic acid every day if you become pregnant. Ask for birth control (contraception) if you want to prevent pregnancy. Osteoporosis and menopause Osteoporosis is a disease in which the bones lose minerals and strength with aging. This can result in bone fractures. If you are 65 years old or older, or if you are at risk for osteoporosis and fractures, ask your health care provider if you should: Be screened for bone loss. Take a calcium or vitamin D supplement to lower your risk of fractures. Be given hormone replacement therapy (HRT) to treat symptoms of menopause. Follow these instructions at home: Lifestyle Do not use any products that contain nicotine or tobacco, such as cigarettes, e-cigarettes, and chewing tobacco. If you need help quitting, ask your health care provider. Do not use street drugs. Do not share needles. Ask your health care provider for help if you need support or information about quitting drugs. Alcohol use Do not drink alcohol if: Your health care provider tells you not to drink. You are pregnant, may be pregnant, or are planning to become pregnant. If you drink alcohol: Limit how much you use to 0-1 drink a day. Limit intake if you are breastfeeding. Be aware of how much alcohol is in your drink. In the [place] one drink equals one 12 oz bottle of beer (355 mL), one 5 oz glass of wine (148 mL), or one 1 oz glass of hard liquor (44 mL). General instructions Schedule regular health, dental, and eye exams. Stay current with your vaccines. Tell your health care provider if: You often feel depressed. You have ever been abused or do not feel safe at home. Summary Adopting a healthy lifestyle and getting preventive care are important in promoting health and wellness. Follow your health care provider's instructions about healthy diet, exercising, and getting tested or screened for diseases. Follow your health care provider's  instructions on monitoring your cholesterol and blood pressure. This information is not intended to replace advice given to you by your health care provider. Make sure you discuss any questions you have with your health care provider. Document Revised: 03/07/2020 Document Reviewed: 12/21/2017 Elsevier Patient Education  2022 Elsevier Inc.  

## 2020-10-06 DIAGNOSIS — D509 Iron deficiency anemia, unspecified: Secondary | ICD-10-CM | POA: Diagnosis not present

## 2020-10-06 DIAGNOSIS — Z992 Dependence on renal dialysis: Secondary | ICD-10-CM | POA: Diagnosis not present

## 2020-10-06 DIAGNOSIS — L299 Pruritus, unspecified: Secondary | ICD-10-CM | POA: Diagnosis not present

## 2020-10-06 DIAGNOSIS — N2581 Secondary hyperparathyroidism of renal origin: Secondary | ICD-10-CM | POA: Diagnosis not present

## 2020-10-06 DIAGNOSIS — N186 End stage renal disease: Secondary | ICD-10-CM | POA: Diagnosis not present

## 2020-10-06 DIAGNOSIS — D631 Anemia in chronic kidney disease: Secondary | ICD-10-CM | POA: Diagnosis not present

## 2020-10-08 DIAGNOSIS — D509 Iron deficiency anemia, unspecified: Secondary | ICD-10-CM | POA: Diagnosis not present

## 2020-10-08 DIAGNOSIS — N2581 Secondary hyperparathyroidism of renal origin: Secondary | ICD-10-CM | POA: Diagnosis not present

## 2020-10-08 DIAGNOSIS — D631 Anemia in chronic kidney disease: Secondary | ICD-10-CM | POA: Diagnosis not present

## 2020-10-08 DIAGNOSIS — N186 End stage renal disease: Secondary | ICD-10-CM | POA: Diagnosis not present

## 2020-10-08 DIAGNOSIS — Z992 Dependence on renal dialysis: Secondary | ICD-10-CM | POA: Diagnosis not present

## 2020-10-08 DIAGNOSIS — L299 Pruritus, unspecified: Secondary | ICD-10-CM | POA: Diagnosis not present

## 2020-10-10 DIAGNOSIS — L299 Pruritus, unspecified: Secondary | ICD-10-CM | POA: Diagnosis not present

## 2020-10-10 DIAGNOSIS — Z992 Dependence on renal dialysis: Secondary | ICD-10-CM | POA: Diagnosis not present

## 2020-10-10 DIAGNOSIS — D509 Iron deficiency anemia, unspecified: Secondary | ICD-10-CM | POA: Diagnosis not present

## 2020-10-10 DIAGNOSIS — N186 End stage renal disease: Secondary | ICD-10-CM | POA: Diagnosis not present

## 2020-10-10 DIAGNOSIS — D631 Anemia in chronic kidney disease: Secondary | ICD-10-CM | POA: Diagnosis not present

## 2020-10-10 DIAGNOSIS — N2581 Secondary hyperparathyroidism of renal origin: Secondary | ICD-10-CM | POA: Diagnosis not present

## 2020-10-11 DIAGNOSIS — N186 End stage renal disease: Secondary | ICD-10-CM | POA: Diagnosis not present

## 2020-10-11 DIAGNOSIS — E1122 Type 2 diabetes mellitus with diabetic chronic kidney disease: Secondary | ICD-10-CM | POA: Diagnosis not present

## 2020-10-11 DIAGNOSIS — Z992 Dependence on renal dialysis: Secondary | ICD-10-CM | POA: Diagnosis not present

## 2020-10-13 DIAGNOSIS — N2581 Secondary hyperparathyroidism of renal origin: Secondary | ICD-10-CM | POA: Diagnosis not present

## 2020-10-13 DIAGNOSIS — D509 Iron deficiency anemia, unspecified: Secondary | ICD-10-CM | POA: Diagnosis not present

## 2020-10-13 DIAGNOSIS — N186 End stage renal disease: Secondary | ICD-10-CM | POA: Diagnosis not present

## 2020-10-13 DIAGNOSIS — L299 Pruritus, unspecified: Secondary | ICD-10-CM | POA: Diagnosis not present

## 2020-10-13 DIAGNOSIS — Z992 Dependence on renal dialysis: Secondary | ICD-10-CM | POA: Diagnosis not present

## 2020-10-13 DIAGNOSIS — D631 Anemia in chronic kidney disease: Secondary | ICD-10-CM | POA: Diagnosis not present

## 2020-10-15 DIAGNOSIS — D631 Anemia in chronic kidney disease: Secondary | ICD-10-CM | POA: Diagnosis not present

## 2020-10-15 DIAGNOSIS — Z992 Dependence on renal dialysis: Secondary | ICD-10-CM | POA: Diagnosis not present

## 2020-10-15 DIAGNOSIS — N186 End stage renal disease: Secondary | ICD-10-CM | POA: Diagnosis not present

## 2020-10-15 DIAGNOSIS — D509 Iron deficiency anemia, unspecified: Secondary | ICD-10-CM | POA: Diagnosis not present

## 2020-10-15 DIAGNOSIS — N2581 Secondary hyperparathyroidism of renal origin: Secondary | ICD-10-CM | POA: Diagnosis not present

## 2020-10-15 DIAGNOSIS — L299 Pruritus, unspecified: Secondary | ICD-10-CM | POA: Diagnosis not present

## 2020-10-17 DIAGNOSIS — D509 Iron deficiency anemia, unspecified: Secondary | ICD-10-CM | POA: Diagnosis not present

## 2020-10-17 DIAGNOSIS — L299 Pruritus, unspecified: Secondary | ICD-10-CM | POA: Diagnosis not present

## 2020-10-17 DIAGNOSIS — N186 End stage renal disease: Secondary | ICD-10-CM | POA: Diagnosis not present

## 2020-10-17 DIAGNOSIS — Z992 Dependence on renal dialysis: Secondary | ICD-10-CM | POA: Diagnosis not present

## 2020-10-17 DIAGNOSIS — D631 Anemia in chronic kidney disease: Secondary | ICD-10-CM | POA: Diagnosis not present

## 2020-10-17 DIAGNOSIS — N2581 Secondary hyperparathyroidism of renal origin: Secondary | ICD-10-CM | POA: Diagnosis not present

## 2020-10-20 DIAGNOSIS — D631 Anemia in chronic kidney disease: Secondary | ICD-10-CM | POA: Diagnosis not present

## 2020-10-20 DIAGNOSIS — N2581 Secondary hyperparathyroidism of renal origin: Secondary | ICD-10-CM | POA: Diagnosis not present

## 2020-10-20 DIAGNOSIS — N186 End stage renal disease: Secondary | ICD-10-CM | POA: Diagnosis not present

## 2020-10-20 DIAGNOSIS — Z992 Dependence on renal dialysis: Secondary | ICD-10-CM | POA: Diagnosis not present

## 2020-10-20 DIAGNOSIS — L299 Pruritus, unspecified: Secondary | ICD-10-CM | POA: Diagnosis not present

## 2020-10-20 DIAGNOSIS — D509 Iron deficiency anemia, unspecified: Secondary | ICD-10-CM | POA: Diagnosis not present

## 2020-10-22 DIAGNOSIS — D509 Iron deficiency anemia, unspecified: Secondary | ICD-10-CM | POA: Diagnosis not present

## 2020-10-22 DIAGNOSIS — N186 End stage renal disease: Secondary | ICD-10-CM | POA: Diagnosis not present

## 2020-10-22 DIAGNOSIS — N2581 Secondary hyperparathyroidism of renal origin: Secondary | ICD-10-CM | POA: Diagnosis not present

## 2020-10-22 DIAGNOSIS — L299 Pruritus, unspecified: Secondary | ICD-10-CM | POA: Diagnosis not present

## 2020-10-22 DIAGNOSIS — Z992 Dependence on renal dialysis: Secondary | ICD-10-CM | POA: Diagnosis not present

## 2020-10-22 DIAGNOSIS — D631 Anemia in chronic kidney disease: Secondary | ICD-10-CM | POA: Diagnosis not present

## 2020-10-23 DIAGNOSIS — R1310 Dysphagia, unspecified: Secondary | ICD-10-CM | POA: Diagnosis not present

## 2020-10-23 DIAGNOSIS — R633 Feeding difficulties, unspecified: Secondary | ICD-10-CM | POA: Diagnosis not present

## 2020-10-23 DIAGNOSIS — R1312 Dysphagia, oropharyngeal phase: Secondary | ICD-10-CM | POA: Diagnosis not present

## 2020-10-24 DIAGNOSIS — L299 Pruritus, unspecified: Secondary | ICD-10-CM | POA: Diagnosis not present

## 2020-10-24 DIAGNOSIS — D509 Iron deficiency anemia, unspecified: Secondary | ICD-10-CM | POA: Diagnosis not present

## 2020-10-24 DIAGNOSIS — N186 End stage renal disease: Secondary | ICD-10-CM | POA: Diagnosis not present

## 2020-10-24 DIAGNOSIS — Z992 Dependence on renal dialysis: Secondary | ICD-10-CM | POA: Diagnosis not present

## 2020-10-24 DIAGNOSIS — N2581 Secondary hyperparathyroidism of renal origin: Secondary | ICD-10-CM | POA: Diagnosis not present

## 2020-10-24 DIAGNOSIS — D631 Anemia in chronic kidney disease: Secondary | ICD-10-CM | POA: Diagnosis not present

## 2020-10-27 DIAGNOSIS — N186 End stage renal disease: Secondary | ICD-10-CM | POA: Diagnosis not present

## 2020-10-27 DIAGNOSIS — D509 Iron deficiency anemia, unspecified: Secondary | ICD-10-CM | POA: Diagnosis not present

## 2020-10-27 DIAGNOSIS — Z992 Dependence on renal dialysis: Secondary | ICD-10-CM | POA: Diagnosis not present

## 2020-10-27 DIAGNOSIS — D631 Anemia in chronic kidney disease: Secondary | ICD-10-CM | POA: Diagnosis not present

## 2020-10-27 DIAGNOSIS — L299 Pruritus, unspecified: Secondary | ICD-10-CM | POA: Diagnosis not present

## 2020-10-27 DIAGNOSIS — N2581 Secondary hyperparathyroidism of renal origin: Secondary | ICD-10-CM | POA: Diagnosis not present

## 2020-10-29 DIAGNOSIS — D509 Iron deficiency anemia, unspecified: Secondary | ICD-10-CM | POA: Diagnosis not present

## 2020-10-29 DIAGNOSIS — L299 Pruritus, unspecified: Secondary | ICD-10-CM | POA: Diagnosis not present

## 2020-10-29 DIAGNOSIS — D631 Anemia in chronic kidney disease: Secondary | ICD-10-CM | POA: Diagnosis not present

## 2020-10-29 DIAGNOSIS — N186 End stage renal disease: Secondary | ICD-10-CM | POA: Diagnosis not present

## 2020-10-29 DIAGNOSIS — N2581 Secondary hyperparathyroidism of renal origin: Secondary | ICD-10-CM | POA: Diagnosis not present

## 2020-10-29 DIAGNOSIS — Z992 Dependence on renal dialysis: Secondary | ICD-10-CM | POA: Diagnosis not present

## 2020-10-30 DIAGNOSIS — R1312 Dysphagia, oropharyngeal phase: Secondary | ICD-10-CM | POA: Diagnosis not present

## 2020-10-30 DIAGNOSIS — J387 Other diseases of larynx: Secondary | ICD-10-CM | POA: Diagnosis not present

## 2020-10-31 DIAGNOSIS — D631 Anemia in chronic kidney disease: Secondary | ICD-10-CM | POA: Diagnosis not present

## 2020-10-31 DIAGNOSIS — N186 End stage renal disease: Secondary | ICD-10-CM | POA: Diagnosis not present

## 2020-10-31 DIAGNOSIS — D509 Iron deficiency anemia, unspecified: Secondary | ICD-10-CM | POA: Diagnosis not present

## 2020-10-31 DIAGNOSIS — L299 Pruritus, unspecified: Secondary | ICD-10-CM | POA: Diagnosis not present

## 2020-10-31 DIAGNOSIS — N2581 Secondary hyperparathyroidism of renal origin: Secondary | ICD-10-CM | POA: Diagnosis not present

## 2020-10-31 DIAGNOSIS — Z992 Dependence on renal dialysis: Secondary | ICD-10-CM | POA: Diagnosis not present

## 2020-11-03 DIAGNOSIS — L299 Pruritus, unspecified: Secondary | ICD-10-CM | POA: Diagnosis not present

## 2020-11-03 DIAGNOSIS — D631 Anemia in chronic kidney disease: Secondary | ICD-10-CM | POA: Diagnosis not present

## 2020-11-03 DIAGNOSIS — N2581 Secondary hyperparathyroidism of renal origin: Secondary | ICD-10-CM | POA: Diagnosis not present

## 2020-11-03 DIAGNOSIS — D509 Iron deficiency anemia, unspecified: Secondary | ICD-10-CM | POA: Diagnosis not present

## 2020-11-03 DIAGNOSIS — N186 End stage renal disease: Secondary | ICD-10-CM | POA: Diagnosis not present

## 2020-11-03 DIAGNOSIS — Z992 Dependence on renal dialysis: Secondary | ICD-10-CM | POA: Diagnosis not present

## 2020-11-05 DIAGNOSIS — Z992 Dependence on renal dialysis: Secondary | ICD-10-CM | POA: Diagnosis not present

## 2020-11-05 DIAGNOSIS — D509 Iron deficiency anemia, unspecified: Secondary | ICD-10-CM | POA: Diagnosis not present

## 2020-11-05 DIAGNOSIS — D631 Anemia in chronic kidney disease: Secondary | ICD-10-CM | POA: Diagnosis not present

## 2020-11-05 DIAGNOSIS — L299 Pruritus, unspecified: Secondary | ICD-10-CM | POA: Diagnosis not present

## 2020-11-05 DIAGNOSIS — N2581 Secondary hyperparathyroidism of renal origin: Secondary | ICD-10-CM | POA: Diagnosis not present

## 2020-11-05 DIAGNOSIS — E1122 Type 2 diabetes mellitus with diabetic chronic kidney disease: Secondary | ICD-10-CM | POA: Diagnosis not present

## 2020-11-05 DIAGNOSIS — N186 End stage renal disease: Secondary | ICD-10-CM | POA: Diagnosis not present

## 2020-11-06 DIAGNOSIS — L871 Reactive perforating collagenosis: Secondary | ICD-10-CM | POA: Diagnosis not present

## 2020-11-06 DIAGNOSIS — L299 Pruritus, unspecified: Secondary | ICD-10-CM | POA: Diagnosis not present

## 2020-11-07 DIAGNOSIS — D631 Anemia in chronic kidney disease: Secondary | ICD-10-CM | POA: Diagnosis not present

## 2020-11-07 DIAGNOSIS — Z992 Dependence on renal dialysis: Secondary | ICD-10-CM | POA: Diagnosis not present

## 2020-11-07 DIAGNOSIS — N186 End stage renal disease: Secondary | ICD-10-CM | POA: Diagnosis not present

## 2020-11-07 DIAGNOSIS — L299 Pruritus, unspecified: Secondary | ICD-10-CM | POA: Diagnosis not present

## 2020-11-07 DIAGNOSIS — N2581 Secondary hyperparathyroidism of renal origin: Secondary | ICD-10-CM | POA: Diagnosis not present

## 2020-11-07 DIAGNOSIS — D509 Iron deficiency anemia, unspecified: Secondary | ICD-10-CM | POA: Diagnosis not present

## 2020-11-10 DIAGNOSIS — D631 Anemia in chronic kidney disease: Secondary | ICD-10-CM | POA: Diagnosis not present

## 2020-11-10 DIAGNOSIS — Z992 Dependence on renal dialysis: Secondary | ICD-10-CM | POA: Diagnosis not present

## 2020-11-10 DIAGNOSIS — D509 Iron deficiency anemia, unspecified: Secondary | ICD-10-CM | POA: Diagnosis not present

## 2020-11-10 DIAGNOSIS — L299 Pruritus, unspecified: Secondary | ICD-10-CM | POA: Diagnosis not present

## 2020-11-10 DIAGNOSIS — N186 End stage renal disease: Secondary | ICD-10-CM | POA: Diagnosis not present

## 2020-11-10 DIAGNOSIS — N2581 Secondary hyperparathyroidism of renal origin: Secondary | ICD-10-CM | POA: Diagnosis not present

## 2020-11-11 DIAGNOSIS — Z992 Dependence on renal dialysis: Secondary | ICD-10-CM | POA: Diagnosis not present

## 2020-11-11 DIAGNOSIS — E1122 Type 2 diabetes mellitus with diabetic chronic kidney disease: Secondary | ICD-10-CM | POA: Diagnosis not present

## 2020-11-11 DIAGNOSIS — N186 End stage renal disease: Secondary | ICD-10-CM | POA: Diagnosis not present

## 2020-11-12 DIAGNOSIS — D509 Iron deficiency anemia, unspecified: Secondary | ICD-10-CM | POA: Diagnosis not present

## 2020-11-12 DIAGNOSIS — Z992 Dependence on renal dialysis: Secondary | ICD-10-CM | POA: Diagnosis not present

## 2020-11-12 DIAGNOSIS — E1122 Type 2 diabetes mellitus with diabetic chronic kidney disease: Secondary | ICD-10-CM | POA: Diagnosis not present

## 2020-11-12 DIAGNOSIS — D631 Anemia in chronic kidney disease: Secondary | ICD-10-CM | POA: Diagnosis not present

## 2020-11-12 DIAGNOSIS — N186 End stage renal disease: Secondary | ICD-10-CM | POA: Diagnosis not present

## 2020-11-12 DIAGNOSIS — N2581 Secondary hyperparathyroidism of renal origin: Secondary | ICD-10-CM | POA: Diagnosis not present

## 2020-11-12 DIAGNOSIS — L299 Pruritus, unspecified: Secondary | ICD-10-CM | POA: Diagnosis not present

## 2020-11-14 DIAGNOSIS — L299 Pruritus, unspecified: Secondary | ICD-10-CM | POA: Diagnosis not present

## 2020-11-14 DIAGNOSIS — D631 Anemia in chronic kidney disease: Secondary | ICD-10-CM | POA: Diagnosis not present

## 2020-11-14 DIAGNOSIS — N2581 Secondary hyperparathyroidism of renal origin: Secondary | ICD-10-CM | POA: Diagnosis not present

## 2020-11-14 DIAGNOSIS — Z992 Dependence on renal dialysis: Secondary | ICD-10-CM | POA: Diagnosis not present

## 2020-11-14 DIAGNOSIS — E1122 Type 2 diabetes mellitus with diabetic chronic kidney disease: Secondary | ICD-10-CM | POA: Diagnosis not present

## 2020-11-14 DIAGNOSIS — N186 End stage renal disease: Secondary | ICD-10-CM | POA: Diagnosis not present

## 2020-11-17 DIAGNOSIS — D631 Anemia in chronic kidney disease: Secondary | ICD-10-CM | POA: Diagnosis not present

## 2020-11-17 DIAGNOSIS — N2581 Secondary hyperparathyroidism of renal origin: Secondary | ICD-10-CM | POA: Diagnosis not present

## 2020-11-17 DIAGNOSIS — L299 Pruritus, unspecified: Secondary | ICD-10-CM | POA: Diagnosis not present

## 2020-11-17 DIAGNOSIS — N186 End stage renal disease: Secondary | ICD-10-CM | POA: Diagnosis not present

## 2020-11-17 DIAGNOSIS — Z992 Dependence on renal dialysis: Secondary | ICD-10-CM | POA: Diagnosis not present

## 2020-11-17 DIAGNOSIS — E1122 Type 2 diabetes mellitus with diabetic chronic kidney disease: Secondary | ICD-10-CM | POA: Diagnosis not present

## 2020-11-19 DIAGNOSIS — Z992 Dependence on renal dialysis: Secondary | ICD-10-CM | POA: Diagnosis not present

## 2020-11-19 DIAGNOSIS — D631 Anemia in chronic kidney disease: Secondary | ICD-10-CM | POA: Diagnosis not present

## 2020-11-19 DIAGNOSIS — N2581 Secondary hyperparathyroidism of renal origin: Secondary | ICD-10-CM | POA: Diagnosis not present

## 2020-11-19 DIAGNOSIS — E1122 Type 2 diabetes mellitus with diabetic chronic kidney disease: Secondary | ICD-10-CM | POA: Diagnosis not present

## 2020-11-19 DIAGNOSIS — N186 End stage renal disease: Secondary | ICD-10-CM | POA: Diagnosis not present

## 2020-11-19 DIAGNOSIS — L299 Pruritus, unspecified: Secondary | ICD-10-CM | POA: Diagnosis not present

## 2020-11-21 DIAGNOSIS — E1122 Type 2 diabetes mellitus with diabetic chronic kidney disease: Secondary | ICD-10-CM | POA: Diagnosis not present

## 2020-11-21 DIAGNOSIS — N2581 Secondary hyperparathyroidism of renal origin: Secondary | ICD-10-CM | POA: Diagnosis not present

## 2020-11-21 DIAGNOSIS — N186 End stage renal disease: Secondary | ICD-10-CM | POA: Diagnosis not present

## 2020-11-21 DIAGNOSIS — D631 Anemia in chronic kidney disease: Secondary | ICD-10-CM | POA: Diagnosis not present

## 2020-11-21 DIAGNOSIS — L299 Pruritus, unspecified: Secondary | ICD-10-CM | POA: Diagnosis not present

## 2020-11-21 DIAGNOSIS — Z992 Dependence on renal dialysis: Secondary | ICD-10-CM | POA: Diagnosis not present

## 2020-11-24 DIAGNOSIS — N2581 Secondary hyperparathyroidism of renal origin: Secondary | ICD-10-CM | POA: Diagnosis not present

## 2020-11-24 DIAGNOSIS — Z992 Dependence on renal dialysis: Secondary | ICD-10-CM | POA: Diagnosis not present

## 2020-11-24 DIAGNOSIS — N186 End stage renal disease: Secondary | ICD-10-CM | POA: Diagnosis not present

## 2020-11-24 DIAGNOSIS — L299 Pruritus, unspecified: Secondary | ICD-10-CM | POA: Diagnosis not present

## 2020-11-24 DIAGNOSIS — E1122 Type 2 diabetes mellitus with diabetic chronic kidney disease: Secondary | ICD-10-CM | POA: Diagnosis not present

## 2020-11-24 DIAGNOSIS — D631 Anemia in chronic kidney disease: Secondary | ICD-10-CM | POA: Diagnosis not present

## 2020-11-26 DIAGNOSIS — E1122 Type 2 diabetes mellitus with diabetic chronic kidney disease: Secondary | ICD-10-CM | POA: Diagnosis not present

## 2020-11-26 DIAGNOSIS — Z992 Dependence on renal dialysis: Secondary | ICD-10-CM | POA: Diagnosis not present

## 2020-11-26 DIAGNOSIS — N186 End stage renal disease: Secondary | ICD-10-CM | POA: Diagnosis not present

## 2020-11-26 DIAGNOSIS — L299 Pruritus, unspecified: Secondary | ICD-10-CM | POA: Diagnosis not present

## 2020-11-26 DIAGNOSIS — N2581 Secondary hyperparathyroidism of renal origin: Secondary | ICD-10-CM | POA: Diagnosis not present

## 2020-11-26 DIAGNOSIS — D631 Anemia in chronic kidney disease: Secondary | ICD-10-CM | POA: Diagnosis not present

## 2020-11-28 DIAGNOSIS — E1122 Type 2 diabetes mellitus with diabetic chronic kidney disease: Secondary | ICD-10-CM | POA: Diagnosis not present

## 2020-11-28 DIAGNOSIS — N2581 Secondary hyperparathyroidism of renal origin: Secondary | ICD-10-CM | POA: Diagnosis not present

## 2020-11-28 DIAGNOSIS — L299 Pruritus, unspecified: Secondary | ICD-10-CM | POA: Diagnosis not present

## 2020-11-28 DIAGNOSIS — D631 Anemia in chronic kidney disease: Secondary | ICD-10-CM | POA: Diagnosis not present

## 2020-11-28 DIAGNOSIS — Z992 Dependence on renal dialysis: Secondary | ICD-10-CM | POA: Diagnosis not present

## 2020-11-28 DIAGNOSIS — N186 End stage renal disease: Secondary | ICD-10-CM | POA: Diagnosis not present

## 2020-12-01 DIAGNOSIS — N186 End stage renal disease: Secondary | ICD-10-CM | POA: Diagnosis not present

## 2020-12-01 DIAGNOSIS — D631 Anemia in chronic kidney disease: Secondary | ICD-10-CM | POA: Diagnosis not present

## 2020-12-01 DIAGNOSIS — L299 Pruritus, unspecified: Secondary | ICD-10-CM | POA: Diagnosis not present

## 2020-12-01 DIAGNOSIS — N2581 Secondary hyperparathyroidism of renal origin: Secondary | ICD-10-CM | POA: Diagnosis not present

## 2020-12-01 DIAGNOSIS — Z992 Dependence on renal dialysis: Secondary | ICD-10-CM | POA: Diagnosis not present

## 2020-12-01 DIAGNOSIS — E1122 Type 2 diabetes mellitus with diabetic chronic kidney disease: Secondary | ICD-10-CM | POA: Diagnosis not present

## 2020-12-03 DIAGNOSIS — D631 Anemia in chronic kidney disease: Secondary | ICD-10-CM | POA: Diagnosis not present

## 2020-12-03 DIAGNOSIS — E1122 Type 2 diabetes mellitus with diabetic chronic kidney disease: Secondary | ICD-10-CM | POA: Diagnosis not present

## 2020-12-03 DIAGNOSIS — N186 End stage renal disease: Secondary | ICD-10-CM | POA: Diagnosis not present

## 2020-12-03 DIAGNOSIS — Z992 Dependence on renal dialysis: Secondary | ICD-10-CM | POA: Diagnosis not present

## 2020-12-03 DIAGNOSIS — N2581 Secondary hyperparathyroidism of renal origin: Secondary | ICD-10-CM | POA: Diagnosis not present

## 2020-12-03 DIAGNOSIS — L299 Pruritus, unspecified: Secondary | ICD-10-CM | POA: Diagnosis not present

## 2020-12-06 DIAGNOSIS — Z992 Dependence on renal dialysis: Secondary | ICD-10-CM | POA: Diagnosis not present

## 2020-12-06 DIAGNOSIS — N186 End stage renal disease: Secondary | ICD-10-CM | POA: Diagnosis not present

## 2020-12-06 DIAGNOSIS — D631 Anemia in chronic kidney disease: Secondary | ICD-10-CM | POA: Diagnosis not present

## 2020-12-06 DIAGNOSIS — N2581 Secondary hyperparathyroidism of renal origin: Secondary | ICD-10-CM | POA: Diagnosis not present

## 2020-12-06 DIAGNOSIS — L299 Pruritus, unspecified: Secondary | ICD-10-CM | POA: Diagnosis not present

## 2020-12-06 DIAGNOSIS — E1122 Type 2 diabetes mellitus with diabetic chronic kidney disease: Secondary | ICD-10-CM | POA: Diagnosis not present

## 2020-12-08 DIAGNOSIS — N2581 Secondary hyperparathyroidism of renal origin: Secondary | ICD-10-CM | POA: Diagnosis not present

## 2020-12-08 DIAGNOSIS — L299 Pruritus, unspecified: Secondary | ICD-10-CM | POA: Diagnosis not present

## 2020-12-08 DIAGNOSIS — E1122 Type 2 diabetes mellitus with diabetic chronic kidney disease: Secondary | ICD-10-CM | POA: Diagnosis not present

## 2020-12-08 DIAGNOSIS — N186 End stage renal disease: Secondary | ICD-10-CM | POA: Diagnosis not present

## 2020-12-08 DIAGNOSIS — Z992 Dependence on renal dialysis: Secondary | ICD-10-CM | POA: Diagnosis not present

## 2020-12-08 DIAGNOSIS — D631 Anemia in chronic kidney disease: Secondary | ICD-10-CM | POA: Diagnosis not present

## 2020-12-09 DIAGNOSIS — L871 Reactive perforating collagenosis: Secondary | ICD-10-CM | POA: Diagnosis not present

## 2020-12-09 DIAGNOSIS — Z23 Encounter for immunization: Secondary | ICD-10-CM | POA: Diagnosis not present

## 2020-12-09 DIAGNOSIS — L299 Pruritus, unspecified: Secondary | ICD-10-CM | POA: Diagnosis not present

## 2020-12-09 DIAGNOSIS — L700 Acne vulgaris: Secondary | ICD-10-CM | POA: Diagnosis not present

## 2020-12-10 DIAGNOSIS — L299 Pruritus, unspecified: Secondary | ICD-10-CM | POA: Diagnosis not present

## 2020-12-10 DIAGNOSIS — E1122 Type 2 diabetes mellitus with diabetic chronic kidney disease: Secondary | ICD-10-CM | POA: Diagnosis not present

## 2020-12-10 DIAGNOSIS — N2581 Secondary hyperparathyroidism of renal origin: Secondary | ICD-10-CM | POA: Diagnosis not present

## 2020-12-10 DIAGNOSIS — Z992 Dependence on renal dialysis: Secondary | ICD-10-CM | POA: Diagnosis not present

## 2020-12-10 DIAGNOSIS — N186 End stage renal disease: Secondary | ICD-10-CM | POA: Diagnosis not present

## 2020-12-10 DIAGNOSIS — D631 Anemia in chronic kidney disease: Secondary | ICD-10-CM | POA: Diagnosis not present

## 2020-12-11 DIAGNOSIS — N186 End stage renal disease: Secondary | ICD-10-CM | POA: Diagnosis not present

## 2020-12-11 DIAGNOSIS — Z992 Dependence on renal dialysis: Secondary | ICD-10-CM | POA: Diagnosis not present

## 2020-12-11 DIAGNOSIS — E1122 Type 2 diabetes mellitus with diabetic chronic kidney disease: Secondary | ICD-10-CM | POA: Diagnosis not present

## 2020-12-12 DIAGNOSIS — D631 Anemia in chronic kidney disease: Secondary | ICD-10-CM | POA: Diagnosis not present

## 2020-12-12 DIAGNOSIS — N186 End stage renal disease: Secondary | ICD-10-CM | POA: Diagnosis not present

## 2020-12-12 DIAGNOSIS — N2581 Secondary hyperparathyroidism of renal origin: Secondary | ICD-10-CM | POA: Diagnosis not present

## 2020-12-12 DIAGNOSIS — L299 Pruritus, unspecified: Secondary | ICD-10-CM | POA: Diagnosis not present

## 2020-12-12 DIAGNOSIS — Z992 Dependence on renal dialysis: Secondary | ICD-10-CM | POA: Diagnosis not present

## 2020-12-12 DIAGNOSIS — D509 Iron deficiency anemia, unspecified: Secondary | ICD-10-CM | POA: Diagnosis not present

## 2020-12-15 DIAGNOSIS — D631 Anemia in chronic kidney disease: Secondary | ICD-10-CM | POA: Diagnosis not present

## 2020-12-15 DIAGNOSIS — Z992 Dependence on renal dialysis: Secondary | ICD-10-CM | POA: Diagnosis not present

## 2020-12-15 DIAGNOSIS — N186 End stage renal disease: Secondary | ICD-10-CM | POA: Diagnosis not present

## 2020-12-15 DIAGNOSIS — N2581 Secondary hyperparathyroidism of renal origin: Secondary | ICD-10-CM | POA: Diagnosis not present

## 2020-12-15 DIAGNOSIS — L299 Pruritus, unspecified: Secondary | ICD-10-CM | POA: Diagnosis not present

## 2020-12-15 DIAGNOSIS — D509 Iron deficiency anemia, unspecified: Secondary | ICD-10-CM | POA: Diagnosis not present

## 2020-12-17 DIAGNOSIS — N186 End stage renal disease: Secondary | ICD-10-CM | POA: Diagnosis not present

## 2020-12-17 DIAGNOSIS — Z992 Dependence on renal dialysis: Secondary | ICD-10-CM | POA: Diagnosis not present

## 2020-12-17 DIAGNOSIS — L299 Pruritus, unspecified: Secondary | ICD-10-CM | POA: Diagnosis not present

## 2020-12-17 DIAGNOSIS — D631 Anemia in chronic kidney disease: Secondary | ICD-10-CM | POA: Diagnosis not present

## 2020-12-17 DIAGNOSIS — N2581 Secondary hyperparathyroidism of renal origin: Secondary | ICD-10-CM | POA: Diagnosis not present

## 2020-12-17 DIAGNOSIS — D509 Iron deficiency anemia, unspecified: Secondary | ICD-10-CM | POA: Diagnosis not present

## 2020-12-19 DIAGNOSIS — L299 Pruritus, unspecified: Secondary | ICD-10-CM | POA: Diagnosis not present

## 2020-12-19 DIAGNOSIS — D631 Anemia in chronic kidney disease: Secondary | ICD-10-CM | POA: Diagnosis not present

## 2020-12-19 DIAGNOSIS — N2581 Secondary hyperparathyroidism of renal origin: Secondary | ICD-10-CM | POA: Diagnosis not present

## 2020-12-19 DIAGNOSIS — D509 Iron deficiency anemia, unspecified: Secondary | ICD-10-CM | POA: Diagnosis not present

## 2020-12-19 DIAGNOSIS — Z992 Dependence on renal dialysis: Secondary | ICD-10-CM | POA: Diagnosis not present

## 2020-12-19 DIAGNOSIS — N186 End stage renal disease: Secondary | ICD-10-CM | POA: Diagnosis not present

## 2020-12-20 ENCOUNTER — Other Ambulatory Visit: Payer: Self-pay | Admitting: Cardiovascular Disease

## 2020-12-22 DIAGNOSIS — D509 Iron deficiency anemia, unspecified: Secondary | ICD-10-CM | POA: Diagnosis not present

## 2020-12-22 DIAGNOSIS — N186 End stage renal disease: Secondary | ICD-10-CM | POA: Diagnosis not present

## 2020-12-22 DIAGNOSIS — D631 Anemia in chronic kidney disease: Secondary | ICD-10-CM | POA: Diagnosis not present

## 2020-12-22 DIAGNOSIS — Z992 Dependence on renal dialysis: Secondary | ICD-10-CM | POA: Diagnosis not present

## 2020-12-22 DIAGNOSIS — L299 Pruritus, unspecified: Secondary | ICD-10-CM | POA: Diagnosis not present

## 2020-12-22 DIAGNOSIS — N2581 Secondary hyperparathyroidism of renal origin: Secondary | ICD-10-CM | POA: Diagnosis not present

## 2020-12-23 DIAGNOSIS — J387 Other diseases of larynx: Secondary | ICD-10-CM | POA: Diagnosis not present

## 2020-12-23 DIAGNOSIS — K115 Sialolithiasis: Secondary | ICD-10-CM | POA: Diagnosis not present

## 2020-12-23 DIAGNOSIS — Z87891 Personal history of nicotine dependence: Secondary | ICD-10-CM | POA: Diagnosis not present

## 2020-12-23 DIAGNOSIS — K118 Other diseases of salivary glands: Secondary | ICD-10-CM | POA: Diagnosis not present

## 2020-12-24 DIAGNOSIS — L299 Pruritus, unspecified: Secondary | ICD-10-CM | POA: Diagnosis not present

## 2020-12-24 DIAGNOSIS — N2581 Secondary hyperparathyroidism of renal origin: Secondary | ICD-10-CM | POA: Diagnosis not present

## 2020-12-24 DIAGNOSIS — Z992 Dependence on renal dialysis: Secondary | ICD-10-CM | POA: Diagnosis not present

## 2020-12-24 DIAGNOSIS — D631 Anemia in chronic kidney disease: Secondary | ICD-10-CM | POA: Diagnosis not present

## 2020-12-24 DIAGNOSIS — N186 End stage renal disease: Secondary | ICD-10-CM | POA: Diagnosis not present

## 2020-12-24 DIAGNOSIS — D509 Iron deficiency anemia, unspecified: Secondary | ICD-10-CM | POA: Diagnosis not present

## 2020-12-26 DIAGNOSIS — N186 End stage renal disease: Secondary | ICD-10-CM | POA: Diagnosis not present

## 2020-12-26 DIAGNOSIS — D509 Iron deficiency anemia, unspecified: Secondary | ICD-10-CM | POA: Diagnosis not present

## 2020-12-26 DIAGNOSIS — D631 Anemia in chronic kidney disease: Secondary | ICD-10-CM | POA: Diagnosis not present

## 2020-12-26 DIAGNOSIS — L299 Pruritus, unspecified: Secondary | ICD-10-CM | POA: Diagnosis not present

## 2020-12-26 DIAGNOSIS — N2581 Secondary hyperparathyroidism of renal origin: Secondary | ICD-10-CM | POA: Diagnosis not present

## 2020-12-26 DIAGNOSIS — Z992 Dependence on renal dialysis: Secondary | ICD-10-CM | POA: Diagnosis not present

## 2020-12-29 DIAGNOSIS — R110 Nausea: Secondary | ICD-10-CM | POA: Diagnosis not present

## 2020-12-29 DIAGNOSIS — K2100 Gastro-esophageal reflux disease with esophagitis, without bleeding: Secondary | ICD-10-CM | POA: Diagnosis not present

## 2020-12-29 DIAGNOSIS — N2581 Secondary hyperparathyroidism of renal origin: Secondary | ICD-10-CM | POA: Diagnosis not present

## 2020-12-29 DIAGNOSIS — K219 Gastro-esophageal reflux disease without esophagitis: Secondary | ICD-10-CM | POA: Diagnosis not present

## 2020-12-29 DIAGNOSIS — D631 Anemia in chronic kidney disease: Secondary | ICD-10-CM | POA: Diagnosis not present

## 2020-12-29 DIAGNOSIS — N186 End stage renal disease: Secondary | ICD-10-CM | POA: Diagnosis not present

## 2020-12-29 DIAGNOSIS — L299 Pruritus, unspecified: Secondary | ICD-10-CM | POA: Diagnosis not present

## 2020-12-29 DIAGNOSIS — Z1211 Encounter for screening for malignant neoplasm of colon: Secondary | ICD-10-CM | POA: Diagnosis not present

## 2020-12-29 DIAGNOSIS — Z992 Dependence on renal dialysis: Secondary | ICD-10-CM | POA: Diagnosis not present

## 2020-12-29 DIAGNOSIS — D509 Iron deficiency anemia, unspecified: Secondary | ICD-10-CM | POA: Diagnosis not present

## 2020-12-31 DIAGNOSIS — D631 Anemia in chronic kidney disease: Secondary | ICD-10-CM | POA: Diagnosis not present

## 2020-12-31 DIAGNOSIS — L299 Pruritus, unspecified: Secondary | ICD-10-CM | POA: Diagnosis not present

## 2020-12-31 DIAGNOSIS — Z992 Dependence on renal dialysis: Secondary | ICD-10-CM | POA: Diagnosis not present

## 2020-12-31 DIAGNOSIS — D509 Iron deficiency anemia, unspecified: Secondary | ICD-10-CM | POA: Diagnosis not present

## 2020-12-31 DIAGNOSIS — N2581 Secondary hyperparathyroidism of renal origin: Secondary | ICD-10-CM | POA: Diagnosis not present

## 2020-12-31 DIAGNOSIS — N186 End stage renal disease: Secondary | ICD-10-CM | POA: Diagnosis not present

## 2021-01-02 DIAGNOSIS — D509 Iron deficiency anemia, unspecified: Secondary | ICD-10-CM | POA: Diagnosis not present

## 2021-01-02 DIAGNOSIS — N186 End stage renal disease: Secondary | ICD-10-CM | POA: Diagnosis not present

## 2021-01-02 DIAGNOSIS — Z992 Dependence on renal dialysis: Secondary | ICD-10-CM | POA: Diagnosis not present

## 2021-01-02 DIAGNOSIS — L299 Pruritus, unspecified: Secondary | ICD-10-CM | POA: Diagnosis not present

## 2021-01-02 DIAGNOSIS — N2581 Secondary hyperparathyroidism of renal origin: Secondary | ICD-10-CM | POA: Diagnosis not present

## 2021-01-02 DIAGNOSIS — D631 Anemia in chronic kidney disease: Secondary | ICD-10-CM | POA: Diagnosis not present

## 2021-01-05 DIAGNOSIS — N186 End stage renal disease: Secondary | ICD-10-CM | POA: Diagnosis not present

## 2021-01-05 DIAGNOSIS — Z992 Dependence on renal dialysis: Secondary | ICD-10-CM | POA: Diagnosis not present

## 2021-01-05 DIAGNOSIS — N2581 Secondary hyperparathyroidism of renal origin: Secondary | ICD-10-CM | POA: Diagnosis not present

## 2021-01-05 DIAGNOSIS — D509 Iron deficiency anemia, unspecified: Secondary | ICD-10-CM | POA: Diagnosis not present

## 2021-01-05 DIAGNOSIS — L299 Pruritus, unspecified: Secondary | ICD-10-CM | POA: Diagnosis not present

## 2021-01-05 DIAGNOSIS — D631 Anemia in chronic kidney disease: Secondary | ICD-10-CM | POA: Diagnosis not present

## 2021-01-07 DIAGNOSIS — N2581 Secondary hyperparathyroidism of renal origin: Secondary | ICD-10-CM | POA: Diagnosis not present

## 2021-01-07 DIAGNOSIS — D509 Iron deficiency anemia, unspecified: Secondary | ICD-10-CM | POA: Diagnosis not present

## 2021-01-07 DIAGNOSIS — D631 Anemia in chronic kidney disease: Secondary | ICD-10-CM | POA: Diagnosis not present

## 2021-01-07 DIAGNOSIS — L299 Pruritus, unspecified: Secondary | ICD-10-CM | POA: Diagnosis not present

## 2021-01-07 DIAGNOSIS — N186 End stage renal disease: Secondary | ICD-10-CM | POA: Diagnosis not present

## 2021-01-07 DIAGNOSIS — Z992 Dependence on renal dialysis: Secondary | ICD-10-CM | POA: Diagnosis not present

## 2021-01-09 DIAGNOSIS — N186 End stage renal disease: Secondary | ICD-10-CM | POA: Diagnosis not present

## 2021-01-09 DIAGNOSIS — N2581 Secondary hyperparathyroidism of renal origin: Secondary | ICD-10-CM | POA: Diagnosis not present

## 2021-01-09 DIAGNOSIS — Z992 Dependence on renal dialysis: Secondary | ICD-10-CM | POA: Diagnosis not present

## 2021-01-09 DIAGNOSIS — D631 Anemia in chronic kidney disease: Secondary | ICD-10-CM | POA: Diagnosis not present

## 2021-01-09 DIAGNOSIS — D509 Iron deficiency anemia, unspecified: Secondary | ICD-10-CM | POA: Diagnosis not present

## 2021-01-09 DIAGNOSIS — L299 Pruritus, unspecified: Secondary | ICD-10-CM | POA: Diagnosis not present

## 2021-01-11 DIAGNOSIS — Z992 Dependence on renal dialysis: Secondary | ICD-10-CM | POA: Diagnosis not present

## 2021-01-11 DIAGNOSIS — N186 End stage renal disease: Secondary | ICD-10-CM | POA: Diagnosis not present

## 2021-01-11 DIAGNOSIS — E1122 Type 2 diabetes mellitus with diabetic chronic kidney disease: Secondary | ICD-10-CM | POA: Diagnosis not present

## 2021-01-12 DIAGNOSIS — D631 Anemia in chronic kidney disease: Secondary | ICD-10-CM | POA: Diagnosis not present

## 2021-01-12 DIAGNOSIS — Z992 Dependence on renal dialysis: Secondary | ICD-10-CM | POA: Diagnosis not present

## 2021-01-12 DIAGNOSIS — N186 End stage renal disease: Secondary | ICD-10-CM | POA: Diagnosis not present

## 2021-01-12 DIAGNOSIS — D509 Iron deficiency anemia, unspecified: Secondary | ICD-10-CM | POA: Diagnosis not present

## 2021-01-12 DIAGNOSIS — L299 Pruritus, unspecified: Secondary | ICD-10-CM | POA: Diagnosis not present

## 2021-01-12 DIAGNOSIS — N2581 Secondary hyperparathyroidism of renal origin: Secondary | ICD-10-CM | POA: Diagnosis not present

## 2021-01-14 DIAGNOSIS — Z992 Dependence on renal dialysis: Secondary | ICD-10-CM | POA: Diagnosis not present

## 2021-01-14 DIAGNOSIS — D509 Iron deficiency anemia, unspecified: Secondary | ICD-10-CM | POA: Diagnosis not present

## 2021-01-14 DIAGNOSIS — D631 Anemia in chronic kidney disease: Secondary | ICD-10-CM | POA: Diagnosis not present

## 2021-01-14 DIAGNOSIS — L299 Pruritus, unspecified: Secondary | ICD-10-CM | POA: Diagnosis not present

## 2021-01-14 DIAGNOSIS — N186 End stage renal disease: Secondary | ICD-10-CM | POA: Diagnosis not present

## 2021-01-14 DIAGNOSIS — N2581 Secondary hyperparathyroidism of renal origin: Secondary | ICD-10-CM | POA: Diagnosis not present

## 2021-01-21 DIAGNOSIS — Z992 Dependence on renal dialysis: Secondary | ICD-10-CM | POA: Diagnosis not present

## 2021-01-21 DIAGNOSIS — L299 Pruritus, unspecified: Secondary | ICD-10-CM | POA: Diagnosis not present

## 2021-01-21 DIAGNOSIS — N2581 Secondary hyperparathyroidism of renal origin: Secondary | ICD-10-CM | POA: Diagnosis not present

## 2021-01-21 DIAGNOSIS — D509 Iron deficiency anemia, unspecified: Secondary | ICD-10-CM | POA: Diagnosis not present

## 2021-01-21 DIAGNOSIS — E1122 Type 2 diabetes mellitus with diabetic chronic kidney disease: Secondary | ICD-10-CM | POA: Diagnosis not present

## 2021-01-21 DIAGNOSIS — N186 End stage renal disease: Secondary | ICD-10-CM | POA: Diagnosis not present

## 2021-01-21 DIAGNOSIS — D631 Anemia in chronic kidney disease: Secondary | ICD-10-CM | POA: Diagnosis not present

## 2021-01-23 DIAGNOSIS — D509 Iron deficiency anemia, unspecified: Secondary | ICD-10-CM | POA: Diagnosis not present

## 2021-01-23 DIAGNOSIS — N186 End stage renal disease: Secondary | ICD-10-CM | POA: Diagnosis not present

## 2021-01-23 DIAGNOSIS — N2581 Secondary hyperparathyroidism of renal origin: Secondary | ICD-10-CM | POA: Diagnosis not present

## 2021-01-23 DIAGNOSIS — L299 Pruritus, unspecified: Secondary | ICD-10-CM | POA: Diagnosis not present

## 2021-01-23 DIAGNOSIS — Z992 Dependence on renal dialysis: Secondary | ICD-10-CM | POA: Diagnosis not present

## 2021-01-23 DIAGNOSIS — D631 Anemia in chronic kidney disease: Secondary | ICD-10-CM | POA: Diagnosis not present

## 2021-01-26 DIAGNOSIS — N186 End stage renal disease: Secondary | ICD-10-CM | POA: Diagnosis not present

## 2021-01-26 DIAGNOSIS — N2581 Secondary hyperparathyroidism of renal origin: Secondary | ICD-10-CM | POA: Diagnosis not present

## 2021-01-26 DIAGNOSIS — Z992 Dependence on renal dialysis: Secondary | ICD-10-CM | POA: Diagnosis not present

## 2021-01-26 DIAGNOSIS — D509 Iron deficiency anemia, unspecified: Secondary | ICD-10-CM | POA: Diagnosis not present

## 2021-01-26 DIAGNOSIS — D631 Anemia in chronic kidney disease: Secondary | ICD-10-CM | POA: Diagnosis not present

## 2021-01-26 DIAGNOSIS — L299 Pruritus, unspecified: Secondary | ICD-10-CM | POA: Diagnosis not present

## 2021-01-28 DIAGNOSIS — N2581 Secondary hyperparathyroidism of renal origin: Secondary | ICD-10-CM | POA: Diagnosis not present

## 2021-01-28 DIAGNOSIS — N186 End stage renal disease: Secondary | ICD-10-CM | POA: Diagnosis not present

## 2021-01-28 DIAGNOSIS — L299 Pruritus, unspecified: Secondary | ICD-10-CM | POA: Diagnosis not present

## 2021-01-28 DIAGNOSIS — D509 Iron deficiency anemia, unspecified: Secondary | ICD-10-CM | POA: Diagnosis not present

## 2021-01-28 DIAGNOSIS — D631 Anemia in chronic kidney disease: Secondary | ICD-10-CM | POA: Diagnosis not present

## 2021-01-28 DIAGNOSIS — Z992 Dependence on renal dialysis: Secondary | ICD-10-CM | POA: Diagnosis not present

## 2021-01-29 ENCOUNTER — Encounter: Payer: Self-pay | Admitting: Family Medicine

## 2021-01-29 ENCOUNTER — Other Ambulatory Visit: Payer: Self-pay

## 2021-01-29 ENCOUNTER — Ambulatory Visit: Payer: Medicare Other | Attending: Family Medicine | Admitting: Family Medicine

## 2021-01-29 VITALS — BP 120/68 | HR 91 | Ht 65.0 in | Wt 139.0 lb

## 2021-01-29 DIAGNOSIS — E1122 Type 2 diabetes mellitus with diabetic chronic kidney disease: Secondary | ICD-10-CM

## 2021-01-29 DIAGNOSIS — E1169 Type 2 diabetes mellitus with other specified complication: Secondary | ICD-10-CM

## 2021-01-29 DIAGNOSIS — N185 Chronic kidney disease, stage 5: Secondary | ICD-10-CM | POA: Diagnosis not present

## 2021-01-29 DIAGNOSIS — I120 Hypertensive chronic kidney disease with stage 5 chronic kidney disease or end stage renal disease: Secondary | ICD-10-CM | POA: Diagnosis not present

## 2021-01-29 DIAGNOSIS — G8929 Other chronic pain: Secondary | ICD-10-CM | POA: Diagnosis not present

## 2021-01-29 DIAGNOSIS — E119 Type 2 diabetes mellitus without complications: Secondary | ICD-10-CM | POA: Insufficient documentation

## 2021-01-29 MED ORDER — ACETAMINOPHEN-CODEINE #3 300-30 MG PO TABS: 1.0000 | ORAL_TABLET | Freq: Every evening | ORAL | 0 refills | Status: DC | PRN

## 2021-01-29 NOTE — Progress Notes (Signed)
Subjective:  Patient ID: Susan Horton, female    DOB: 02-14-1970  Age: 51 y.o. MRN: [id number]  CC: Hip Pain   HPI Susan Horton is a 51 y.o. year old female with a history of Type 2 Diabetes Mellitus (A1c 5.8 from care everywhere), Gastroparesis, (s/p Botox injection of the pylorus) Hypertension, CAD, ESRD on hemodialysis, Asthma.  Interval History: She is currently being evaluated by the [facility] renal transplant team at the last visit in 09/2020) notes she did have a lot of arterial disease which could make her not a suitable renal transplant candidate but this would be reviewed by the transplant team.  Last A1c was done by her [facility] dialysis center and came back at 5.8 on 01/21/2021.  She complains of L hip pain for which I had referred her to orthopedics after x-ray revealed femoral calcinosis.  Orthopedic note reviewed and per notes this would be a risky procedure.  She has opted to not pursue surgical intervention. Complains Cymbalta and Gabapentin were ineffective in the past but she currently has a just Flexeril for her pain. Endorses being seen by pain management in the past but does not remember how long and does not recall which practice she was seen at. Admits to using marijuana once in a while to improve her appetite. Past Medical History:  Diagnosis Date   Abnormal stress test    Anemia    Angio-edema    Asthma    CAD (coronary artery disease)    DES to mid LAD July 2018, residual moderate RCA disease   Cataract    CKD (chronic kidney disease) stage 4, GFR 15-29 ml/min (HCC)    Dialysis T/Th/Sa   DVT (deep venous thrombosis) ([place])    1996 during pregnancy, 2015 left leg   Eczema    Essential hypertension 12/11/2015   Gastroparesis    GERD (gastroesophageal reflux disease)    Gluteal abscess 12/27/2018   Hidradenitis    Migraine    Neuropathy    Peripheral vascular disease (HCC)    blood clot in leg   Progressive angina ([place]) 06/05/2014   Chest pain    Type 2 diabetes mellitus ([place])    Type II diabetes mellitus ([place])    Urticaria     Past Surgical History:  Procedure Laterality Date   A/V FISTULAGRAM N/A 06/22/2017   Procedure: A/V FISTULAGRAM - left arm;  Surgeon: [name], MD;  Location: [place] [facility];  Service: Cardiovascular;  Laterality: N/A;   ABDOMINAL AORTOGRAM W/LOWER EXTREMITY Bilateral 12/14/2018   Procedure: ABDOMINAL AORTOGRAM W/LOWER EXTREMITY;  Surgeon: [name], MD;  Location: [place] [facility];  Service: Cardiovascular;  Laterality: Bilateral;   ABDOMINAL AORTOGRAM W/LOWER EXTREMITY N/A 04/25/2019   Procedure: ABDOMINAL AORTOGRAM W/LOWER EXTREMITY;  Surgeon: [name], MD;  Location: [place] [facility];  Service: Cardiovascular;  Laterality: N/A;   ABDOMINAL AORTOGRAM W/LOWER EXTREMITY Left 07/18/2019   Procedure: ABDOMINAL AORTOGRAM W/LOWER EXTREMITY;  Surgeon: [name], MD;  Location: [place] [facility];  Service: Cardiovascular;  Laterality: Left;   ABDOMINOPLASTY     ADENOIDECTOMY     APPENDECTOMY  1995   AV FISTULA PLACEMENT Left 02/01/2017   Procedure: ARTERIOVENOUS BRACHIOCEPHALIC (AV) FISTULA CREATION;  Surgeon: [name], MD;  Location: [facility];  Service: Vascular;  Laterality: Left;   [place] INTERVENTION N/A 08/05/2016   Procedure: Coronary  Stent Intervention;  Surgeon: [name], MD;  Location: [place] [facility];  Service: Cardiovascular;  Laterality: N/A;   ESOPHAGOGASTRODUODENOSCOPY (EGD) WITH PROPOFOL N/A 07/01/2020   Procedure: ESOPHAGOGASTRODUODENOSCOPY (EGD) WITH PROPOFOL;  Surgeon: Gatha Mayer, MD;  Location: [place];  Service: Endoscopy;  Laterality: N/A;   EXPLORATORY LAPAROTOMY  08/14/2005   lysis of adhesions, drainage of tubo-ovarian abscess   FISTULA SUPERFICIALIZATION Left 09/14/2017   Procedure: FISTULA SUPERFICIALIZATION ARTERIOVENOUS FISTULA LEFT  ARM;  Surgeon: [name], MD;  Location: [place];  Service: Vascular;  Laterality: Left;   FLEXIBLE SIGMOIDOSCOPY N/A 01/04/2019   Procedure: FLEXIBLE SIGMOIDOSCOPY;  Surgeon: Clarene Essex, MD;  Location: [place];  Service: Endoscopy;  Laterality: N/A;   HYDRADENITIS EXCISION  02/08/2011   Procedure: EXCISION HYDRADENITIS GROIN;  Surgeon: [name], MD;  Location: [place];  Service: General;  Laterality: N/A;  Excisioin of Hidradenitis Left groin   INCISION AND DRAINAGE ABSCESS N/A 01/11/2019   Procedure: INCISION AND DRAINAGE SACRAL ABSCESS;  Surgeon: Georganna Skeans, MD;  Location: [place];  Service: General;  Laterality: N/A;   INGUINAL HIDRADENITIS EXCISION  07/06/2010   bilateral   INSERTION OF DIALYSIS CATHETER N/A 09/14/2017   Procedure: INSERTION OF TUNNELED DIALYSIS CATHETER Right Internal Jugular;  Surgeon: [name], MD;  Location: [place];  Service: Vascular;  Laterality: N/A;   IR FLUORO GUIDE CV LINE RIGHT  03/01/2019   IR US GUIDE BX ASP/DRAIN  01/01/2019   IR US GUIDE VASC ACCESS RIGHT  03/01/2019   LEFT HEART CATH AND CORONARY ANGIOGRAPHY N/A 08/05/2016   Procedure: Left Heart Cath and Coronary Angiography;  Surgeon: [name], MD;  Location: [place] [facility];  Service: Cardiovascular;  Laterality: N/A;   PERIPHERAL VASCULAR INTERVENTION Left 12/14/2018   Procedure: PERIPHERAL VASCULAR INTERVENTION;  Surgeon: [name], MD;  Location: [place] [facility];  Service: Cardiovascular;  Laterality: Left;  SFA   PERIPHERAL VASCULAR INTERVENTION Left 04/25/2019   Procedure: PERIPHERAL VASCULAR INTERVENTION;  Surgeon: [name], MD;  Location: [place] [facility];  Service: Cardiovascular;  Laterality: Left;  right superficial femoral, and left iliac   PERIPHERAL VASCULAR INTERVENTION Left 07/18/2019   Procedure: PERIPHERAL VASCULAR INTERVENTION;  Surgeon: [name], MD;  Location: [place] [facility];  Service:  Cardiovascular;  Laterality: Left;  external iliac   REDUCTION MAMMAPLASTY  2002   TONSILLECTOMY     TUBAL LIGATION  1996   VAGINAL HYSTERECTOMY  08/04/2005   and cysto    Family History  Problem Relation Age of Onset   Allergic rhinitis Mother    Hypertension Father    Allergic rhinitis Father    Hypertension Sister    Allergic rhinitis Sister    Hypertension Brother    Allergic rhinitis Brother    Breast cancer Cousin        x2    Allergies  Allergen Reactions   Penicillins Anaphylaxis, Itching, Swelling, Rash and Other (See Comments)    Swelling of throat & whole mouth  Has patient had a PCN reaction causing immediate rash, facial/tongue/throat swelling, SOB or lightheadedness with hypotension: Yes Has patient had a PCN reaction causing severe rash involving mucus membranes or skin necrosis: No Has patient had a PCN reaction that required hospitalization: No Has patient had a PCN reaction occurring within the last 10 years: No If all of the above answers are "NO", then may proceed with Cephalosporin use.   Repatha [Evolocumab]  Itching   Lisinopril Cough    Outpatient Medications Prior to Visit  Medication Sig Dispense Refill   albuterol (VENTOLIN HFA) 108 (90 Base) MCG/ACT inhaler Inhale 1-2 puffs into the lungs every 6 (six) hours as needed for wheezing or shortness of breath. 18 g 2   amitriptyline (ELAVIL) 10 MG tablet Take 1 tablet (10 mg total) by mouth at bedtime. 30 tablet 1   amLODipine (NORVASC) 10 MG tablet TAKE 1 TABLET BY MOUTH EVERY DAY 90 tablet 3   aspirin 81 MG chewable tablet Chew 1 tablet (81 mg total) by mouth daily. 30 tablet 0   atorvastatin (LIPITOR) 40 MG tablet TAKE 1 TABLET BY MOUTH EVERY DAY 90 tablet 1   B Complex-C-Zn-Folic Acid (DIALYVITE/ZINC) TABS Take 1 tablet by mouth daily.     calcitRIOL (ROCALTROL) 0.5 MCG capsule Take 1.5 mcg by mouth daily.     calcium acetate (PHOSLO) 667 MG capsule Take 667 mg by mouth 3 (three) times daily with  meals.  10   clopidogrel (PLAVIX) 75 MG tablet TAKE 1 TABLET (75 MG TOTAL) BY MOUTH DAILY WITH BREAKFAST. 90 tablet 3   cyclobenzaprine (FLEXERIL) 10 MG tablet Take 1 tablet (10 mg total) by mouth 2 (two) times daily as needed. 60 tablet 1   Fluticasone-Salmeterol (ADVAIR) 250-50 MCG/DOSE AEPB Inhale 1 puff into the lungs 2 (two) times daily. 60 each 3   hydrALAZINE (APRESOLINE) 50 MG tablet Take 50 mg by mouth every 8 (eight) hours.     ipratropium-albuterol (DUONEB) 0.5-2.5 (3) MG/3ML SOLN Take 3 mLs by nebulization every 6 (six) hours as needed. 360 mL 2   iron sucrose (VENOFER) 20 MG/ML injection Iron Sucrose (Venofer)     Methoxy PEG-Epoetin Beta (MIRCERA IJ) Inject into the skin.     metoprolol succinate (TOPROL-XL) 50 MG 24 hr tablet TAKE 1.5 TABLETS BY MOUTH DAILY 135 tablet 3   montelukast (SINGULAIR) 10 MG tablet Take 1 tablet (10 mg total) by mouth at bedtime. 30 tablet 5   nitroGLYCERIN (NITROSTAT) 0.4 MG SL tablet Place 1 tablet (0.4 mg total) under the tongue every 5 (five) minutes as needed for chest pain. 25 tablet 2   ondansetron (ZOFRAN ODT) 4 MG disintegrating tablet Take 1 tablet (4 mg total) by mouth every 8 (eight) hours as needed for nausea or vomiting. 20 tablet 0   pantoprazole (PROTONIX) 40 MG tablet Take 1 tablet (40 mg total) by mouth daily. 30 tablet 0   PRALUENT 75 MG/ML SOAJ INJECT 75 MG INTO THE SKIN EVERY 14 (FOURTEEN) DAYS. 2 mL 11   sucralfate (CARAFATE) 1 GM/10ML suspension Take 10 mLs (1 g total) by mouth 4 (four) times daily -  with meals and at bedtime. 420 mL 0   isosorbide mononitrate (IMDUR) 30 MG 24 hr tablet TAKE 0.5 TABLETS (15 MG TOTAL) BY MOUTH DAILY. 45 tablet 3   No facility-administered medications prior to visit.     ROS Review of Systems  Constitutional:  Negative for activity change, appetite change and fatigue.  HENT:  Negative for congestion, sinus pressure and sore throat.   Eyes:  Negative for visual disturbance.  Respiratory:   Negative for cough, chest tightness, shortness of breath and wheezing.   Cardiovascular:  Negative for chest pain and palpitations.  Gastrointestinal:  Negative for abdominal distention, abdominal pain and constipation.  Endocrine: Negative for polydipsia.  Genitourinary:  Negative for dysuria and frequency.  Musculoskeletal:        See HPI  Skin:  Negative for rash.  Neurological:  Negative for tremors, light-headedness and numbness.  Hematological:  Does not bruise/bleed easily.  Psychiatric/Behavioral:  Negative for agitation and behavioral problems.    Objective:  BP 120/68    Pulse 91    Ht 5\' 5"  (1.651 m)    Wt 139 lb (63 kg)    SpO2 98%    BMI 23.13 kg/m   BP/Weight 01/29/2021 08/11/2020 05/07/621  Systolic BP 762 - 831  Diastolic BP 68 - 76  Wt. (Lbs) 139 145 144  BMI 23.13 24.13 23.96      Physical Exam Constitutional:      Appearance: She is well-developed.  Cardiovascular:     Rate and Rhythm: Normal rate.     Heart sounds: Normal heart sounds. No murmur heard. Pulmonary:     Effort: Pulmonary effort is normal.     Breath sounds: Normal breath sounds. No wheezing or rales.  Chest:     Chest wall: No tenderness.  Abdominal:     General: Bowel sounds are normal. There is no distension.     Palpations: Abdomen is soft. There is no mass.     Tenderness: There is no abdominal tenderness.  Musculoskeletal:     Right lower leg: No edema.     Left lower leg: No edema.     Comments: Left hip swelling with tenderness to palpation  Neurological:     Mental Status: She is alert and oriented to person, place, and time.  Psychiatric:        Mood and Affect: Mood normal.    CMP Latest Ref Rng & Units 07/01/2020 06/29/2020 03/22/2020  Glucose 70 - 99 mg/dL 81 97 105(H)  BUN 6 - 20 mg/dL 46(H) 26(H) 24(H)  Creatinine 0.44 - 1.00 mg/dL 8.62(H) 6.09(H) 8.18(H)  Sodium 135 - 145 mmol/L 139 138 137  Potassium 3.5 - 5.1 mmol/L 4.0 3.7 4.0  Chloride 98 - 111 mmol/L 94(L) 93(L)  94(L)  CO2 22 - 32 mmol/L 26 31 27   Calcium 8.9 - 10.3 mg/dL 9.2 9.6 10.2  Total Protein 6.5 - 8.1 g/dL - 7.1 8.1  Total Bilirubin 0.3 - 1.2 mg/dL - 1.0 1.2  Alkaline Phos 38 - 126 U/L - 101 79  AST 15 - 41 U/L - 21 16  ALT 0 - 44 U/L - 24 12    Lipid Panel     Component Value Date/Time   CHOL 152 01/19/2019 1448   TRIG 149 01/19/2019 1448   HDL 36 (L) 01/19/2019 1448   CHOLHDL 4.2 01/19/2019 1448   CHOLHDL 7.2 Ratio 01/01/2010 2055   VLDL 30 01/01/2010 2055   LDLCALC 90 01/19/2019 1448    CBC    Component Value Date/Time   WBC 5.9 07/01/2020 0832   RBC 4.07 07/01/2020 0832   HGB 9.7 (L) 07/01/2020 0832   HGB 9.2 (L) 07/26/2016 0000   HCT 32.2 (L) 07/01/2020 0832   HCT 30.7 (L) 07/26/2016 0000   PLT 224 07/01/2020 0832   PLT 474 (H) 07/26/2016 0000   MCV 79.1 (L) 07/01/2020 0832   MCV 82 07/26/2016 0000   MCH 23.8 (L) 07/01/2020 0832   MCHC 30.1 07/01/2020 0832   RDW 19.4 (H) 07/01/2020 0832   RDW 13.6 07/26/2016 0000   LYMPHSABS 1.1 06/29/2020 1310   LYMPHSABS 2.9 07/26/2016 0000   MONOABS 0.4 06/29/2020 1310   EOSABS 0.1 06/29/2020 1310   EOSABS 0.1 07/26/2016 0000   BASOSABS 0.0 06/29/2020 1310   BASOSABS  0.0 07/26/2016 0000    Lab Results  Component Value Date   HGBA1C 4.9 06/30/2020   A1c of 5.8 in 01/2021 from care everywhere. Assessment & Plan:   Problem List Items Addressed This Visit       Cardiovascular and Mediastinum   Hypertension associated with stage 5 chronic kidney disease due to type 2 diabetes mellitus ([place])    Controlled Counseled on blood pressure goal of less than 130/80, low-sodium, DASH diet, medication compliance, 150 minutes of moderate intensity exercise per week. Discussed medication compliance, adverse effects.         Endocrine   Diabetes mellitus ([place]) - Primary    Diet controlled with A1c of 5.8 Counseled on Diabetic diet, my plate method, 570 minutes of moderate intensity exercise/week Blood sugar logs with fasting  goals of 80-120 mg/dl, random of less than 180 and in the event of sugars less than 60 mg/dl or greater than 400 mg/dl encouraged to notify the clinic. Advised on the need for annual eye exams, annual foot exams, Pneumonia vaccine.         Musculoskeletal and Integument   Tumoral calcinosis    With ongoing pain Uncontrolled on Flexeril Surgery would be risky procedure per orthopedic note Placed on Tylenol 3 Referred to pain management      Relevant Medications   acetaminophen-codeine (TYLENOL #3) 300-30 MG tablet   Other Relevant Orders   Ambulatory referral to Pain Clinic   Other Visit Diagnoses     Other chronic pain       Relevant Medications   acetaminophen-codeine (TYLENOL #3) 300-30 MG tablet   Other Relevant Orders   Ambulatory referral to [facility]: She declines a foot exam stating she have this done at a dialysis center and also with the podiatrist.  Meds ordered this encounter  Medications   acetaminophen-codeine (TYLENOL #3) 300-30 MG tablet    Sig: Take 1 tablet by mouth at bedtime as needed for moderate pain.    Dispense:  30 tablet    Refill:  0    Follow-up: Return in about 6 months (around 07/29/2021) for Chronic medical conditions.       Charlott Rakes, MD, FAAFP. [facility] [place], [place]   01/29/2021, 1:12 PM

## 2021-01-29 NOTE — Progress Notes (Signed)
Pain in right hip 

## 2021-01-29 NOTE — Assessment & Plan Note (Signed)
Controlled Counseled on blood pressure goal of less than 130/80, low-sodium, DASH diet, medication compliance, 150 minutes of moderate intensity exercise per week. Discussed medication compliance, adverse effects.

## 2021-01-29 NOTE — Assessment & Plan Note (Signed)
Diet controlled with A1c of 5.8 Counseled on Diabetic diet, my plate method, 924 minutes of moderate intensity exercise/week Blood sugar logs with fasting goals of 80-120 mg/dl, random of less than 180 and in the event of sugars less than 60 mg/dl or greater than 400 mg/dl encouraged to notify the clinic. Advised on the need for annual eye exams, annual foot exams, Pneumonia vaccine.

## 2021-01-29 NOTE — Assessment & Plan Note (Signed)
With ongoing pain Uncontrolled on Flexeril Surgery would be risky procedure per orthopedic note Placed on Tylenol 3 Referred to pain management

## 2021-01-29 NOTE — Patient Instructions (Signed)

## 2021-01-30 DIAGNOSIS — Z992 Dependence on renal dialysis: Secondary | ICD-10-CM | POA: Diagnosis not present

## 2021-01-30 DIAGNOSIS — N186 End stage renal disease: Secondary | ICD-10-CM | POA: Diagnosis not present

## 2021-01-30 DIAGNOSIS — L299 Pruritus, unspecified: Secondary | ICD-10-CM | POA: Diagnosis not present

## 2021-01-30 DIAGNOSIS — D509 Iron deficiency anemia, unspecified: Secondary | ICD-10-CM | POA: Diagnosis not present

## 2021-01-30 DIAGNOSIS — D631 Anemia in chronic kidney disease: Secondary | ICD-10-CM | POA: Diagnosis not present

## 2021-01-30 DIAGNOSIS — N2581 Secondary hyperparathyroidism of renal origin: Secondary | ICD-10-CM | POA: Diagnosis not present

## 2021-02-02 DIAGNOSIS — Z992 Dependence on renal dialysis: Secondary | ICD-10-CM | POA: Diagnosis not present

## 2021-02-02 DIAGNOSIS — N186 End stage renal disease: Secondary | ICD-10-CM | POA: Diagnosis not present

## 2021-02-02 DIAGNOSIS — N2581 Secondary hyperparathyroidism of renal origin: Secondary | ICD-10-CM | POA: Diagnosis not present

## 2021-02-02 DIAGNOSIS — D509 Iron deficiency anemia, unspecified: Secondary | ICD-10-CM | POA: Diagnosis not present

## 2021-02-02 DIAGNOSIS — L299 Pruritus, unspecified: Secondary | ICD-10-CM | POA: Diagnosis not present

## 2021-02-02 DIAGNOSIS — D631 Anemia in chronic kidney disease: Secondary | ICD-10-CM | POA: Diagnosis not present

## 2021-02-04 DIAGNOSIS — D509 Iron deficiency anemia, unspecified: Secondary | ICD-10-CM | POA: Diagnosis not present

## 2021-02-04 DIAGNOSIS — L299 Pruritus, unspecified: Secondary | ICD-10-CM | POA: Diagnosis not present

## 2021-02-04 DIAGNOSIS — N186 End stage renal disease: Secondary | ICD-10-CM | POA: Diagnosis not present

## 2021-02-04 DIAGNOSIS — Z992 Dependence on renal dialysis: Secondary | ICD-10-CM | POA: Diagnosis not present

## 2021-02-04 DIAGNOSIS — N2581 Secondary hyperparathyroidism of renal origin: Secondary | ICD-10-CM | POA: Diagnosis not present

## 2021-02-04 DIAGNOSIS — D631 Anemia in chronic kidney disease: Secondary | ICD-10-CM | POA: Diagnosis not present

## 2021-02-05 ENCOUNTER — Ambulatory Visit: Payer: Medicare Other | Admitting: Physician Assistant

## 2021-02-06 DIAGNOSIS — Z992 Dependence on renal dialysis: Secondary | ICD-10-CM | POA: Diagnosis not present

## 2021-02-06 DIAGNOSIS — N186 End stage renal disease: Secondary | ICD-10-CM | POA: Diagnosis not present

## 2021-02-06 DIAGNOSIS — D509 Iron deficiency anemia, unspecified: Secondary | ICD-10-CM | POA: Diagnosis not present

## 2021-02-06 DIAGNOSIS — N2581 Secondary hyperparathyroidism of renal origin: Secondary | ICD-10-CM | POA: Diagnosis not present

## 2021-02-06 DIAGNOSIS — D631 Anemia in chronic kidney disease: Secondary | ICD-10-CM | POA: Diagnosis not present

## 2021-02-06 DIAGNOSIS — L299 Pruritus, unspecified: Secondary | ICD-10-CM | POA: Diagnosis not present

## 2021-02-09 DIAGNOSIS — L299 Pruritus, unspecified: Secondary | ICD-10-CM | POA: Diagnosis not present

## 2021-02-09 DIAGNOSIS — D631 Anemia in chronic kidney disease: Secondary | ICD-10-CM | POA: Diagnosis not present

## 2021-02-09 DIAGNOSIS — D509 Iron deficiency anemia, unspecified: Secondary | ICD-10-CM | POA: Diagnosis not present

## 2021-02-09 DIAGNOSIS — N2581 Secondary hyperparathyroidism of renal origin: Secondary | ICD-10-CM | POA: Diagnosis not present

## 2021-02-09 DIAGNOSIS — N186 End stage renal disease: Secondary | ICD-10-CM | POA: Diagnosis not present

## 2021-02-09 DIAGNOSIS — Z992 Dependence on renal dialysis: Secondary | ICD-10-CM | POA: Diagnosis not present

## 2021-02-11 DIAGNOSIS — N2581 Secondary hyperparathyroidism of renal origin: Secondary | ICD-10-CM | POA: Diagnosis not present

## 2021-02-11 DIAGNOSIS — N186 End stage renal disease: Secondary | ICD-10-CM | POA: Diagnosis not present

## 2021-02-11 DIAGNOSIS — D509 Iron deficiency anemia, unspecified: Secondary | ICD-10-CM | POA: Diagnosis not present

## 2021-02-11 DIAGNOSIS — E1122 Type 2 diabetes mellitus with diabetic chronic kidney disease: Secondary | ICD-10-CM | POA: Diagnosis not present

## 2021-02-11 DIAGNOSIS — Z992 Dependence on renal dialysis: Secondary | ICD-10-CM | POA: Diagnosis not present

## 2021-02-11 DIAGNOSIS — D649 Anemia, unspecified: Secondary | ICD-10-CM | POA: Diagnosis not present

## 2021-02-11 DIAGNOSIS — L299 Pruritus, unspecified: Secondary | ICD-10-CM | POA: Diagnosis not present

## 2021-02-11 DIAGNOSIS — D631 Anemia in chronic kidney disease: Secondary | ICD-10-CM | POA: Diagnosis not present

## 2021-02-13 DIAGNOSIS — N2581 Secondary hyperparathyroidism of renal origin: Secondary | ICD-10-CM | POA: Diagnosis not present

## 2021-02-13 DIAGNOSIS — D631 Anemia in chronic kidney disease: Secondary | ICD-10-CM | POA: Diagnosis not present

## 2021-02-13 DIAGNOSIS — D509 Iron deficiency anemia, unspecified: Secondary | ICD-10-CM | POA: Diagnosis not present

## 2021-02-13 DIAGNOSIS — L299 Pruritus, unspecified: Secondary | ICD-10-CM | POA: Diagnosis not present

## 2021-02-13 DIAGNOSIS — N186 End stage renal disease: Secondary | ICD-10-CM | POA: Diagnosis not present

## 2021-02-13 DIAGNOSIS — Z992 Dependence on renal dialysis: Secondary | ICD-10-CM | POA: Diagnosis not present

## 2021-02-16 DIAGNOSIS — L299 Pruritus, unspecified: Secondary | ICD-10-CM | POA: Diagnosis not present

## 2021-02-16 DIAGNOSIS — N186 End stage renal disease: Secondary | ICD-10-CM | POA: Diagnosis not present

## 2021-02-16 DIAGNOSIS — Z992 Dependence on renal dialysis: Secondary | ICD-10-CM | POA: Diagnosis not present

## 2021-02-16 DIAGNOSIS — N2581 Secondary hyperparathyroidism of renal origin: Secondary | ICD-10-CM | POA: Diagnosis not present

## 2021-02-16 DIAGNOSIS — D631 Anemia in chronic kidney disease: Secondary | ICD-10-CM | POA: Diagnosis not present

## 2021-02-16 DIAGNOSIS — D509 Iron deficiency anemia, unspecified: Secondary | ICD-10-CM | POA: Diagnosis not present

## 2021-02-17 ENCOUNTER — Ambulatory Visit (INDEPENDENT_AMBULATORY_CARE_PROVIDER_SITE_OTHER): Payer: Medicare Other | Admitting: Orthopedic Surgery

## 2021-02-17 ENCOUNTER — Other Ambulatory Visit: Payer: Self-pay

## 2021-02-17 DIAGNOSIS — N186 End stage renal disease: Secondary | ICD-10-CM

## 2021-02-17 DIAGNOSIS — Z794 Long term (current) use of insulin: Secondary | ICD-10-CM

## 2021-02-17 DIAGNOSIS — Z992 Dependence on renal dialysis: Secondary | ICD-10-CM

## 2021-02-17 DIAGNOSIS — E11610 Type 2 diabetes mellitus with diabetic neuropathic arthropathy: Secondary | ICD-10-CM | POA: Diagnosis not present

## 2021-02-18 DIAGNOSIS — N186 End stage renal disease: Secondary | ICD-10-CM | POA: Diagnosis not present

## 2021-02-18 DIAGNOSIS — D509 Iron deficiency anemia, unspecified: Secondary | ICD-10-CM | POA: Diagnosis not present

## 2021-02-18 DIAGNOSIS — D631 Anemia in chronic kidney disease: Secondary | ICD-10-CM | POA: Diagnosis not present

## 2021-02-18 DIAGNOSIS — Z992 Dependence on renal dialysis: Secondary | ICD-10-CM | POA: Diagnosis not present

## 2021-02-18 DIAGNOSIS — L299 Pruritus, unspecified: Secondary | ICD-10-CM | POA: Diagnosis not present

## 2021-02-18 DIAGNOSIS — N2581 Secondary hyperparathyroidism of renal origin: Secondary | ICD-10-CM | POA: Diagnosis not present

## 2021-02-19 ENCOUNTER — Telehealth: Payer: Self-pay

## 2021-02-19 NOTE — Telephone Encounter (Signed)
Copied from [place] [phone]. Topic: General - Other >> Feb 18, 2021 11:04 AM Susan Horton wrote: Reason for CRM: pts apt complex needs a letter from Dr. Margarita Rana stating that pt needs a hand rail on your porch steps / they need a letter before they will put one up/ please advise

## 2021-02-20 DIAGNOSIS — N186 End stage renal disease: Secondary | ICD-10-CM | POA: Diagnosis not present

## 2021-02-20 DIAGNOSIS — N2581 Secondary hyperparathyroidism of renal origin: Secondary | ICD-10-CM | POA: Diagnosis not present

## 2021-02-20 DIAGNOSIS — D631 Anemia in chronic kidney disease: Secondary | ICD-10-CM | POA: Diagnosis not present

## 2021-02-20 DIAGNOSIS — D509 Iron deficiency anemia, unspecified: Secondary | ICD-10-CM | POA: Diagnosis not present

## 2021-02-20 DIAGNOSIS — L299 Pruritus, unspecified: Secondary | ICD-10-CM | POA: Diagnosis not present

## 2021-02-20 DIAGNOSIS — Z992 Dependence on renal dialysis: Secondary | ICD-10-CM | POA: Diagnosis not present

## 2021-02-20 NOTE — Telephone Encounter (Signed)
Pt was called and informed of letter being ready for pick up.

## 2021-02-20 NOTE — Telephone Encounter (Signed)
Done

## 2021-02-22 ENCOUNTER — Encounter: Payer: Self-pay | Admitting: Orthopedic Surgery

## 2021-02-22 NOTE — Progress Notes (Signed)
Office Visit Note   Patient: Susan Horton           Date of Birth: [birth year]           MRN: [id number] Visit Date: 02/17/2021              Requested by: Charlott Rakes, MD [place],  [place] [address] PCP: Charlott Rakes, MD  Chief Complaint  Patient presents with   Left Hip - Pain      HPI: Patient is a 51 year old woman type II diabetic end-stage renal disease on dialysis who has persistent pain from tumoral calcinosis of the left hip.  She states the pain is getting worse in the mass is getting larger.  Assessment & Plan: Visit Diagnoses:  1. ESRD (end stage renal disease) on dialysis ([place])   2. Type 2 diabetes mellitus with diabetic neuropathic arthropathy, with long-term current use of insulin ([place])   3. Tumoral calcinosis     Plan: Discussed that sodium thiosulfate with dialysis may be helpful.  Discussed that with the size of the mass and its location to the sciatic nerve she would be at risk for sciatic nerve injury with mass resection.  Discussed risk of foot drop with surgery.  She will see if the sodium thiosulfate may be tried.  Follow-Up Instructions: Return if symptoms worsen or fail to improve.   Ortho Exam  Patient is alert, oriented, no adenopathy, well-dressed, normal affect, normal respiratory effort. Examination patient has a palpable firm mass posterior lateral aspect of the greater trochanter left hip.  There is no redness or cellulitis no skin ulceration or breakdown.  Patient does have a positive sciatic stretch test secondary to the mass.  No focal motor weakness.  Patient is currently ambulating with a cane.  Imaging: No results found. No images are attached to the encounter.  Labs: Lab Results  Component Value Date   HGBA1C 4.9 06/30/2020   HGBA1C 7.5 (H) 12/26/2018   HGBA1C 9.8 (A) 03/22/2018   ESRSEDRATE 132 (H) 01/12/2019   CRP 2.9 (H) 02/25/2019   CRP 22.8 (H) 01/12/2019   REPTSTATUS 03/06/2019 FINAL 03/01/2019    REPTSTATUS 03/02/2019 FINAL 03/01/2019   GRAMSTAIN  03/01/2019    WBC PRESENT,BOTH PMN AND MONONUCLEAR NO ORGANISMS SEEN CYTOSPIN SMEAR Performed at [facility], [place] [address] [place], [place] [address]    CULT  03/01/2019    NO GROWTH 5 DAYS Performed at [facility] [address] [place], [place] [address]    LABORGA ESCHERICHIA COLI 03/09/2008     Lab Results  Component Value Date   ALBUMIN 3.3 (L) 06/29/2020   ALBUMIN 3.8 03/22/2020   ALBUMIN 1.9 (L) 03/02/2019    Lab Results  Component Value Date   MG 2.1 07/01/2020   No results found for: VD25OH  No results found for: PREALBUMIN CBC EXTENDED Latest Ref Rng & Units 07/01/2020 06/29/2020 03/22/2020  WBC 4.0 - 10.5 K/uL 5.9 5.5 8.1  RBC 3.87 - 5.11 MIL/uL 4.07 4.42 4.71  HGB 12.0 - 15.0 g/dL 9.7(L) 10.6(L) 12.3  HCT 36.0 - 46.0 % 32.2(L) 36.2 39.7  PLT 150 - 400 K/uL 224 212 327  NEUTROABS 1.7 - 7.7 K/uL - 3.9 -  LYMPHSABS 0.7 - 4.0 K/uL - 1.1 -     There is no height or weight on file to calculate BMI.  Orders:  No orders of the defined types were placed in this encounter.  No orders of  the defined types were placed in this encounter.    Procedures: No procedures performed  Clinical Data: No additional findings.  ROS:  All other systems negative, except as noted in the HPI. Review of Systems  Objective: Vital Signs: There were no vitals taken for this visit.  Specialty Comments:  No specialty comments available.  PMFS History: Patient Active Problem List   Diagnosis Date Noted   Diabetes mellitus ([place]) 01/29/2021   Hypertension associated with stage 5 chronic kidney disease due to type 2 diabetes mellitus ([place]) 01/29/2021   Tumoral calcinosis 01/29/2021   Dysphagia    Nausea 09/12/2019   Diarrhea, unspecified 09/12/2019   Pruritus, unspecified 08/13/2019   Anaphylactic shock, unspecified, sequela 08/13/2019   Allergy, unspecified, sequela 08/13/2019   Unspecified cataract  03/19/2019   Unspecified asthma with (acute) exacerbation 03/19/2019   COVID-19 03/06/2019   Intractable nausea and vomiting 02/24/2019   Abdominal pain, epigastric 02/24/2019   Multifocal pneumonia 02/24/2019   Shortness of breath 01/19/2019   Cerebrovascular small vessel disease    Generalized weakness    Abscess of sacrum (HCC)    Gluteal abscess 12/25/2018   Moderate protein-calorie malnutrition ([place]) 12/19/2018   Other disorders resulting from impaired renal tubular function 12/19/2018   Liver disease, unspecified 12/19/2018   Hyperkalemia 12/19/2018   Acidosis 12/19/2018   Hyperlipidemia 11/10/2018   History of arteriosclerotic vascular disease 11/10/2018   Carotid stenosis 11/10/2018   Symptomatic anemia 09/16/2018   Peripheral vascular disease, unspecified ([place]) 09/04/2018   Other disorders of bilirubin metabolism 01/20/2018   Other long term (current) drug therapy 11/28/2017   Other abnormal findings in urine 11/28/2017   Dependence on renal dialysis ([place]) 11/28/2017   ESRD on peritoneal dialysis ([place]) 52/77/8242   Complication of vascular dialysis catheter 09/15/2017   History of anemia due to CKD 07/18/2017   DVT (deep vein thrombosis) in pregnancy 07/18/2017   Encounter for removal of sutures 07/07/2017   Iron deficiency anemia, unspecified 06/30/2017   Secondary hyperparathyroidism of renal origin ([place]) 06/24/2017   Pain, unspecified 06/24/2017   Fever, unspecified 06/24/2017   End stage renal disease ([place]) 06/24/2017   Coagulation defect, unspecified ([place]) 06/24/2017   Anemia in chronic kidney disease 06/24/2017   Chronic diastolic CHF (congestive heart failure) ([place]) 06/18/2017   Moderate persistent asthma 06/10/2017   Adjustment disorder with depressed mood 09/08/2016   CKD (chronic kidney disease) stage 4, GFR 15-29 ml/min (HCC)    CAD S/P mLAD PCI with DES 08/06/2016   Presence of drug coated stent in LAD coronary artery 08/06/2016   Atherosclerotic heart  disease of native coronary artery without angina pectoris 07/11/2016   Carotid bruit present 06/15/2016   Dyslipidemia 06/15/2016   Smoker 06/15/2016   Neuropathy 03/10/2016   Essential hypertension, benign 12/11/2015   Left facial pain 03/04/2015   Abnormal sense of taste 03/04/2015   Gastroparesis    Uncontrolled type 2 diabetes mellitus with peripheral neuropathy 06/05/2014   Yeast infection of the vagina 07/10/2010   Past Medical History:  Diagnosis Date   Abnormal stress test    Anemia    Angio-edema    Asthma    CAD (coronary artery disease)    DES to mid LAD July 2018, residual moderate RCA disease   Cataract    CKD (chronic kidney disease) stage 4, GFR 15-29 ml/min (HCC)    Dialysis T/Th/Sa   DVT (deep venous thrombosis) ([place])    1996 during pregnancy, 2015 left leg   Eczema  Essential hypertension 12/11/2015   Gastroparesis    GERD (gastroesophageal reflux disease)    Gluteal abscess 12/27/2018   Hidradenitis    Migraine    Neuropathy    Peripheral vascular disease (HCC)    blood clot in leg   Progressive angina (HCC) 06/05/2014   Chest pain   Type 2 diabetes mellitus ([place])    Type II diabetes mellitus ([place])    Urticaria     Family History  Problem Relation Age of Onset   Allergic rhinitis Mother    Hypertension Father    Allergic rhinitis Father    Hypertension Sister    Allergic rhinitis Sister    Hypertension Brother    Allergic rhinitis Brother    Breast cancer Cousin        x2    Past Surgical History:  Procedure Laterality Date   A/V FISTULAGRAM N/A 06/22/2017   Procedure: A/V FISTULAGRAM - left arm;  Surgeon: [name], MD;  Location: [facility];  Service: Cardiovascular;  Laterality: N/A;   ABDOMINAL AORTOGRAM W/LOWER EXTREMITY Bilateral 12/14/2018   Procedure: ABDOMINAL AORTOGRAM W/LOWER EXTREMITY;  Surgeon: [name], MD;  Location: [facility];  Service: Cardiovascular;  Laterality: Bilateral;    ABDOMINAL AORTOGRAM W/LOWER EXTREMITY N/A 04/25/2019   Procedure: ABDOMINAL AORTOGRAM W/LOWER EXTREMITY;  Surgeon: [name], MD;  Location: [facility];  Service: Cardiovascular;  Laterality: N/A;   ABDOMINAL AORTOGRAM W/LOWER EXTREMITY Left 07/18/2019   Procedure: ABDOMINAL AORTOGRAM W/LOWER EXTREMITY;  Surgeon: [name], MD;  Location: [facility];  Service: Cardiovascular;  Laterality: Left;   ABDOMINOPLASTY     ADENOIDECTOMY     APPENDECTOMY  1995   AV FISTULA PLACEMENT Left 02/01/2017   Procedure: ARTERIOVENOUS BRACHIOCEPHALIC (AV) FISTULA CREATION;  Surgeon: [name], MD;  Location: [facility];  Service: Vascular;  Laterality: Left;   [place] INTERVENTION N/A 08/05/2016   Procedure: Coronary Stent Intervention;  Surgeon: [name], MD;  Location: [facility];  Service: Cardiovascular;  Laterality: N/A;   ESOPHAGOGASTRODUODENOSCOPY (EGD) WITH PROPOFOL N/A 07/01/2020   Procedure: ESOPHAGOGASTRODUODENOSCOPY (EGD) WITH PROPOFOL;  Surgeon: Gatha Mayer, MD;  Location: [facility];  Service: Endoscopy;  Laterality: N/A;   EXPLORATORY LAPAROTOMY  08/14/2005   lysis of adhesions, drainage of tubo-ovarian abscess   FISTULA SUPERFICIALIZATION Left 09/14/2017   Procedure: FISTULA SUPERFICIALIZATION ARTERIOVENOUS FISTULA LEFT ARM;  Surgeon: [name], MD;  Location: [facility];  Service: Vascular;  Laterality: Left;   FLEXIBLE SIGMOIDOSCOPY N/A 01/04/2019   Procedure: FLEXIBLE SIGMOIDOSCOPY;  Surgeon: Clarene Essex, MD;  Location: [facility];  Service: Endoscopy;  Laterality: N/A;   HYDRADENITIS EXCISION  02/08/2011   Procedure: EXCISION HYDRADENITIS GROIN;  Surgeon: [name], MD;  Location: [facility];  Service: General;  Laterality: N/A;  Excisioin of Hidradenitis Left groin   INCISION AND DRAINAGE ABSCESS N/A 01/11/2019   Procedure:  INCISION AND DRAINAGE SACRAL ABSCESS;  Surgeon: Georganna Skeans, MD;  Location: [facility];  Service: General;  Laterality: N/A;   INGUINAL HIDRADENITIS EXCISION  07/06/2010   bilateral   INSERTION OF DIALYSIS CATHETER N/A 09/14/2017   Procedure: INSERTION OF TUNNELED DIALYSIS CATHETER Right Internal Jugular;  Surgeon: [name], MD;  Location: [facility];  Service: Vascular;  Laterality: N/A;   IR FLUORO GUIDE CV LINE RIGHT  03/01/2019   IR US  GUIDE BX ASP/DRAIN  01/01/2019   IR US GUIDE VASC ACCESS RIGHT  03/01/2019   LEFT HEART CATH AND CORONARY ANGIOGRAPHY N/A 08/05/2016   Procedure: Left Heart Cath and Coronary Angiography;  Surgeon: [name], MD;  Location: [facility];  Service: Cardiovascular;  Laterality: N/A;   PERIPHERAL VASCULAR INTERVENTION Left 12/14/2018   Procedure: PERIPHERAL VASCULAR INTERVENTION;  Surgeon: [name], MD;  Location: [facility];  Service: Cardiovascular;  Laterality: Left;  SFA   PERIPHERAL VASCULAR INTERVENTION Left 04/25/2019   Procedure: PERIPHERAL VASCULAR INTERVENTION;  Surgeon: [name], MD;  Location: [facility];  Service: Cardiovascular;  Laterality: Left;  right superficial femoral, and left iliac   PERIPHERAL VASCULAR INTERVENTION Left 07/18/2019   Procedure: PERIPHERAL VASCULAR INTERVENTION;  Surgeon: [name], MD;  Location: [facility];  Service: Cardiovascular;  Laterality: Left;  external iliac   REDUCTION MAMMAPLASTY  2002   TONSILLECTOMY     TUBAL LIGATION  1996   VAGINAL HYSTERECTOMY  08/04/2005   and cysto   Social History   Occupational History   Occupation: Curator  Tobacco Use   Smoking status: Former    Years: 20.00    Types: Cigarettes    Quit date: 11/2016    Years since quitting: 4.2   Smokeless tobacco: Never  Vaping Use   Vaping Use: Never used  Substance and Sexual Activity   Alcohol use: No    Alcohol/week: 0.0 standard drinks   Drug use:  Yes    Frequency: 2.0 times per week    Types: Marijuana   Sexual activity: Not on file

## 2021-02-23 DIAGNOSIS — N186 End stage renal disease: Secondary | ICD-10-CM | POA: Diagnosis not present

## 2021-02-23 DIAGNOSIS — N2581 Secondary hyperparathyroidism of renal origin: Secondary | ICD-10-CM | POA: Diagnosis not present

## 2021-02-23 DIAGNOSIS — D509 Iron deficiency anemia, unspecified: Secondary | ICD-10-CM | POA: Diagnosis not present

## 2021-02-23 DIAGNOSIS — D631 Anemia in chronic kidney disease: Secondary | ICD-10-CM | POA: Diagnosis not present

## 2021-02-23 DIAGNOSIS — Z992 Dependence on renal dialysis: Secondary | ICD-10-CM | POA: Diagnosis not present

## 2021-02-23 DIAGNOSIS — L299 Pruritus, unspecified: Secondary | ICD-10-CM | POA: Diagnosis not present

## 2021-02-25 DIAGNOSIS — L299 Pruritus, unspecified: Secondary | ICD-10-CM | POA: Diagnosis not present

## 2021-02-25 DIAGNOSIS — N2581 Secondary hyperparathyroidism of renal origin: Secondary | ICD-10-CM | POA: Diagnosis not present

## 2021-02-25 DIAGNOSIS — D631 Anemia in chronic kidney disease: Secondary | ICD-10-CM | POA: Diagnosis not present

## 2021-02-25 DIAGNOSIS — D509 Iron deficiency anemia, unspecified: Secondary | ICD-10-CM | POA: Diagnosis not present

## 2021-02-25 DIAGNOSIS — Z992 Dependence on renal dialysis: Secondary | ICD-10-CM | POA: Diagnosis not present

## 2021-02-25 DIAGNOSIS — N186 End stage renal disease: Secondary | ICD-10-CM | POA: Diagnosis not present

## 2021-02-27 DIAGNOSIS — L299 Pruritus, unspecified: Secondary | ICD-10-CM | POA: Diagnosis not present

## 2021-02-27 DIAGNOSIS — D509 Iron deficiency anemia, unspecified: Secondary | ICD-10-CM | POA: Diagnosis not present

## 2021-02-27 DIAGNOSIS — Z992 Dependence on renal dialysis: Secondary | ICD-10-CM | POA: Diagnosis not present

## 2021-02-27 DIAGNOSIS — N2581 Secondary hyperparathyroidism of renal origin: Secondary | ICD-10-CM | POA: Diagnosis not present

## 2021-02-27 DIAGNOSIS — N186 End stage renal disease: Secondary | ICD-10-CM | POA: Diagnosis not present

## 2021-02-27 DIAGNOSIS — D631 Anemia in chronic kidney disease: Secondary | ICD-10-CM | POA: Diagnosis not present

## 2021-03-02 DIAGNOSIS — N186 End stage renal disease: Secondary | ICD-10-CM | POA: Diagnosis not present

## 2021-03-02 DIAGNOSIS — L299 Pruritus, unspecified: Secondary | ICD-10-CM | POA: Diagnosis not present

## 2021-03-02 DIAGNOSIS — Z992 Dependence on renal dialysis: Secondary | ICD-10-CM | POA: Diagnosis not present

## 2021-03-02 DIAGNOSIS — N2581 Secondary hyperparathyroidism of renal origin: Secondary | ICD-10-CM | POA: Diagnosis not present

## 2021-03-02 DIAGNOSIS — D509 Iron deficiency anemia, unspecified: Secondary | ICD-10-CM | POA: Diagnosis not present

## 2021-03-02 DIAGNOSIS — D631 Anemia in chronic kidney disease: Secondary | ICD-10-CM | POA: Diagnosis not present

## 2021-03-04 DIAGNOSIS — N2581 Secondary hyperparathyroidism of renal origin: Secondary | ICD-10-CM | POA: Diagnosis not present

## 2021-03-04 DIAGNOSIS — D509 Iron deficiency anemia, unspecified: Secondary | ICD-10-CM | POA: Diagnosis not present

## 2021-03-04 DIAGNOSIS — D631 Anemia in chronic kidney disease: Secondary | ICD-10-CM | POA: Diagnosis not present

## 2021-03-04 DIAGNOSIS — L299 Pruritus, unspecified: Secondary | ICD-10-CM | POA: Diagnosis not present

## 2021-03-04 DIAGNOSIS — Z992 Dependence on renal dialysis: Secondary | ICD-10-CM | POA: Diagnosis not present

## 2021-03-04 DIAGNOSIS — N186 End stage renal disease: Secondary | ICD-10-CM | POA: Diagnosis not present

## 2021-03-06 DIAGNOSIS — Z992 Dependence on renal dialysis: Secondary | ICD-10-CM | POA: Diagnosis not present

## 2021-03-06 DIAGNOSIS — N2581 Secondary hyperparathyroidism of renal origin: Secondary | ICD-10-CM | POA: Diagnosis not present

## 2021-03-06 DIAGNOSIS — D509 Iron deficiency anemia, unspecified: Secondary | ICD-10-CM | POA: Diagnosis not present

## 2021-03-06 DIAGNOSIS — N186 End stage renal disease: Secondary | ICD-10-CM | POA: Diagnosis not present

## 2021-03-06 DIAGNOSIS — D631 Anemia in chronic kidney disease: Secondary | ICD-10-CM | POA: Diagnosis not present

## 2021-03-06 DIAGNOSIS — L299 Pruritus, unspecified: Secondary | ICD-10-CM | POA: Diagnosis not present

## 2021-03-09 DIAGNOSIS — N2581 Secondary hyperparathyroidism of renal origin: Secondary | ICD-10-CM | POA: Diagnosis not present

## 2021-03-09 DIAGNOSIS — D631 Anemia in chronic kidney disease: Secondary | ICD-10-CM | POA: Diagnosis not present

## 2021-03-09 DIAGNOSIS — N186 End stage renal disease: Secondary | ICD-10-CM | POA: Diagnosis not present

## 2021-03-09 DIAGNOSIS — Z992 Dependence on renal dialysis: Secondary | ICD-10-CM | POA: Diagnosis not present

## 2021-03-09 DIAGNOSIS — D509 Iron deficiency anemia, unspecified: Secondary | ICD-10-CM | POA: Diagnosis not present

## 2021-03-09 DIAGNOSIS — L299 Pruritus, unspecified: Secondary | ICD-10-CM | POA: Diagnosis not present

## 2021-03-11 DIAGNOSIS — E1122 Type 2 diabetes mellitus with diabetic chronic kidney disease: Secondary | ICD-10-CM | POA: Diagnosis not present

## 2021-03-11 DIAGNOSIS — N2581 Secondary hyperparathyroidism of renal origin: Secondary | ICD-10-CM | POA: Diagnosis not present

## 2021-03-11 DIAGNOSIS — D631 Anemia in chronic kidney disease: Secondary | ICD-10-CM | POA: Diagnosis not present

## 2021-03-11 DIAGNOSIS — D649 Anemia, unspecified: Secondary | ICD-10-CM | POA: Diagnosis not present

## 2021-03-11 DIAGNOSIS — Z992 Dependence on renal dialysis: Secondary | ICD-10-CM | POA: Diagnosis not present

## 2021-03-11 DIAGNOSIS — D509 Iron deficiency anemia, unspecified: Secondary | ICD-10-CM | POA: Diagnosis not present

## 2021-03-11 DIAGNOSIS — L299 Pruritus, unspecified: Secondary | ICD-10-CM | POA: Diagnosis not present

## 2021-03-11 DIAGNOSIS — N186 End stage renal disease: Secondary | ICD-10-CM | POA: Diagnosis not present

## 2021-03-13 ENCOUNTER — Other Ambulatory Visit: Payer: Self-pay

## 2021-03-13 DIAGNOSIS — Z992 Dependence on renal dialysis: Secondary | ICD-10-CM | POA: Diagnosis not present

## 2021-03-13 DIAGNOSIS — D631 Anemia in chronic kidney disease: Secondary | ICD-10-CM | POA: Diagnosis not present

## 2021-03-13 DIAGNOSIS — D509 Iron deficiency anemia, unspecified: Secondary | ICD-10-CM | POA: Diagnosis not present

## 2021-03-13 DIAGNOSIS — N186 End stage renal disease: Secondary | ICD-10-CM | POA: Diagnosis not present

## 2021-03-13 DIAGNOSIS — L299 Pruritus, unspecified: Secondary | ICD-10-CM | POA: Diagnosis not present

## 2021-03-13 DIAGNOSIS — N2581 Secondary hyperparathyroidism of renal origin: Secondary | ICD-10-CM | POA: Diagnosis not present

## 2021-03-13 DIAGNOSIS — I739 Peripheral vascular disease, unspecified: Secondary | ICD-10-CM

## 2021-03-16 DIAGNOSIS — D509 Iron deficiency anemia, unspecified: Secondary | ICD-10-CM | POA: Diagnosis not present

## 2021-03-16 DIAGNOSIS — Z992 Dependence on renal dialysis: Secondary | ICD-10-CM | POA: Diagnosis not present

## 2021-03-16 DIAGNOSIS — N186 End stage renal disease: Secondary | ICD-10-CM | POA: Diagnosis not present

## 2021-03-16 DIAGNOSIS — D631 Anemia in chronic kidney disease: Secondary | ICD-10-CM | POA: Diagnosis not present

## 2021-03-16 DIAGNOSIS — L299 Pruritus, unspecified: Secondary | ICD-10-CM | POA: Diagnosis not present

## 2021-03-16 DIAGNOSIS — N2581 Secondary hyperparathyroidism of renal origin: Secondary | ICD-10-CM | POA: Diagnosis not present

## 2021-03-18 DIAGNOSIS — N2581 Secondary hyperparathyroidism of renal origin: Secondary | ICD-10-CM | POA: Diagnosis not present

## 2021-03-18 DIAGNOSIS — Z992 Dependence on renal dialysis: Secondary | ICD-10-CM | POA: Diagnosis not present

## 2021-03-18 DIAGNOSIS — D631 Anemia in chronic kidney disease: Secondary | ICD-10-CM | POA: Diagnosis not present

## 2021-03-18 DIAGNOSIS — D509 Iron deficiency anemia, unspecified: Secondary | ICD-10-CM | POA: Diagnosis not present

## 2021-03-18 DIAGNOSIS — N186 End stage renal disease: Secondary | ICD-10-CM | POA: Diagnosis not present

## 2021-03-18 DIAGNOSIS — L299 Pruritus, unspecified: Secondary | ICD-10-CM | POA: Diagnosis not present

## 2021-03-20 DIAGNOSIS — N2581 Secondary hyperparathyroidism of renal origin: Secondary | ICD-10-CM | POA: Diagnosis not present

## 2021-03-20 DIAGNOSIS — Z992 Dependence on renal dialysis: Secondary | ICD-10-CM | POA: Diagnosis not present

## 2021-03-20 DIAGNOSIS — D509 Iron deficiency anemia, unspecified: Secondary | ICD-10-CM | POA: Diagnosis not present

## 2021-03-20 DIAGNOSIS — N186 End stage renal disease: Secondary | ICD-10-CM | POA: Diagnosis not present

## 2021-03-20 DIAGNOSIS — D631 Anemia in chronic kidney disease: Secondary | ICD-10-CM | POA: Diagnosis not present

## 2021-03-20 DIAGNOSIS — L299 Pruritus, unspecified: Secondary | ICD-10-CM | POA: Diagnosis not present

## 2021-03-25 DIAGNOSIS — N2581 Secondary hyperparathyroidism of renal origin: Secondary | ICD-10-CM | POA: Diagnosis not present

## 2021-03-25 DIAGNOSIS — D631 Anemia in chronic kidney disease: Secondary | ICD-10-CM | POA: Diagnosis not present

## 2021-03-25 DIAGNOSIS — L299 Pruritus, unspecified: Secondary | ICD-10-CM | POA: Diagnosis not present

## 2021-03-25 DIAGNOSIS — Z992 Dependence on renal dialysis: Secondary | ICD-10-CM | POA: Diagnosis not present

## 2021-03-25 DIAGNOSIS — N186 End stage renal disease: Secondary | ICD-10-CM | POA: Diagnosis not present

## 2021-03-25 DIAGNOSIS — D509 Iron deficiency anemia, unspecified: Secondary | ICD-10-CM | POA: Diagnosis not present

## 2021-03-27 DIAGNOSIS — L299 Pruritus, unspecified: Secondary | ICD-10-CM | POA: Diagnosis not present

## 2021-03-27 DIAGNOSIS — D631 Anemia in chronic kidney disease: Secondary | ICD-10-CM | POA: Diagnosis not present

## 2021-03-27 DIAGNOSIS — Z992 Dependence on renal dialysis: Secondary | ICD-10-CM | POA: Diagnosis not present

## 2021-03-27 DIAGNOSIS — N186 End stage renal disease: Secondary | ICD-10-CM | POA: Diagnosis not present

## 2021-03-27 DIAGNOSIS — N2581 Secondary hyperparathyroidism of renal origin: Secondary | ICD-10-CM | POA: Diagnosis not present

## 2021-03-27 DIAGNOSIS — D509 Iron deficiency anemia, unspecified: Secondary | ICD-10-CM | POA: Diagnosis not present

## 2021-03-30 DIAGNOSIS — D509 Iron deficiency anemia, unspecified: Secondary | ICD-10-CM | POA: Diagnosis not present

## 2021-03-30 DIAGNOSIS — D631 Anemia in chronic kidney disease: Secondary | ICD-10-CM | POA: Diagnosis not present

## 2021-03-30 DIAGNOSIS — N186 End stage renal disease: Secondary | ICD-10-CM | POA: Diagnosis not present

## 2021-03-30 DIAGNOSIS — L299 Pruritus, unspecified: Secondary | ICD-10-CM | POA: Diagnosis not present

## 2021-03-30 DIAGNOSIS — N2581 Secondary hyperparathyroidism of renal origin: Secondary | ICD-10-CM | POA: Diagnosis not present

## 2021-03-30 DIAGNOSIS — Z992 Dependence on renal dialysis: Secondary | ICD-10-CM | POA: Diagnosis not present

## 2021-03-31 ENCOUNTER — Ambulatory Visit (HOSPITAL_COMMUNITY)
Admission: RE | Admit: 2021-03-31 | Discharge: 2021-03-31 | Disposition: A | Discharge status: Home / Self Care | Payer: Medicare Other | Source: Ambulatory Visit | Attending: Vascular Surgery | Admitting: Vascular Surgery

## 2021-03-31 ENCOUNTER — Ambulatory Visit (INDEPENDENT_AMBULATORY_CARE_PROVIDER_SITE_OTHER)
Admission: RE | Admit: 2021-03-31 | Discharge: 2021-03-31 | Disposition: A | Discharge status: Home / Self Care | Payer: Medicare Other | Source: Ambulatory Visit | Attending: Vascular Surgery | Admitting: Vascular Surgery

## 2021-03-31 ENCOUNTER — Ambulatory Visit (INDEPENDENT_AMBULATORY_CARE_PROVIDER_SITE_OTHER): Payer: Medicare Other | Admitting: Vascular Surgery

## 2021-03-31 ENCOUNTER — Encounter: Payer: Self-pay | Admitting: Vascular Surgery

## 2021-03-31 ENCOUNTER — Other Ambulatory Visit: Payer: Self-pay

## 2021-03-31 VITALS — BP 176/75 | HR 61 | Temp 97.8°F | Resp 16 | Ht 65.0 in | Wt 138.0 lb

## 2021-03-31 DIAGNOSIS — I739 Peripheral vascular disease, unspecified: Secondary | ICD-10-CM | POA: Diagnosis not present

## 2021-03-31 NOTE — Progress Notes (Signed)
? ?Patient name: Susan Horton MRN: [id number] DOB: 01-09-1971 Sex: female ? ?REASON FOR VISIT: 71-monthfollow-up for surveillance of PAD ? ?HPI: ?Susan LEWEYis a 51y.o. female with multiple medical problems as listed below including DM and ESRD that presents for 9 month follow-up of her PAD.  Her main complaint today is left hip pain.  She has been evaluated by Dr. DSharol Givenwith orthopedic surgery and has tumoral calcinosis with persistent pain of the left hip.  He has recommended a trial of medical management for now and she has follow-up with him in the next few months.  She feels her legs are doing okay when she walks and not having any claudication symptoms like she had in the past.  She quit smoking several years ago. ? ?Her vascular history includes left SFA stenting on 12/14/2018, right SFA stenting with left external iliac artery stenting on 04/25/2019, with an additional left external iliac stenting on 07/18/2019 all for lifestyle limiting claudication. ? ?Past Medical History:  ?Diagnosis Date  ? Abnormal stress test   ? Anemia   ? Angio-edema   ? Asthma   ? CAD (coronary artery disease)   ? DES to mid LAD July 2018, residual moderate RCA disease  ? Cataract   ? CKD (chronic kidney disease) stage 4, GFR 15-29 ml/min (HCC)   ? Dialysis T/Th/Sa  ? [facility]) ([facility]   ? 1996 during pregnancy, 2015 left leg  ? Eczema   ? Essential hypertension 12/11/2015  ? Gastroparesis   ? GERD (gastroesophageal reflux disease)   ? Gluteal abscess 12/27/2018  ? Hidradenitis   ? Migraine   ? Neuropathy   ? Peripheral vascular disease ([facility]   ? blood clot in leg  ? Progressive angina ([facility] 06/05/2014  ? Chest pain  ? Type 2 diabetes mellitus ([facility]   ? Type II diabetes mellitus ([facility]   ? Urticaria   ? ? ?Past Surgical History:  ?Procedure Laterality Date  ? A/V FISTULAGRAM N/A 06/22/2017  ? Procedure: A/V FISTULAGRAM - left arm;  Surgeon: CWaynetta Sandy MD;  [facility];  Service:  Cardiovascular;  Laterality: N/A;  ? ABDOMINAL AORTOGRAM W/LOWER EXTREMITY Bilateral 12/14/2018  ? Procedure: ABDOMINAL AORTOGRAM W/LOWER EXTREMITY;  Surgeon: CMarty Heck MD;  [facility];  Service: Cardiovascular;  Laterality: Bilateral;  ? ABDOMINAL AORTOGRAM W/LOWER EXTREMITY N/A 04/25/2019  ? Procedure: ABDOMINAL AORTOGRAM W/LOWER EXTREMITY;  Surgeon: CMarty Heck MD;  [facility];  Service: Cardiovascular;  Laterality: N/A;  ? ABDOMINAL AORTOGRAM W/LOWER EXTREMITY Left 07/18/2019  ? Procedure: ABDOMINAL AORTOGRAM W/LOWER EXTREMITY;  Surgeon: CMarty Heck MD;  [facility];  Service: Cardiovascular;  Laterality: Left;  ? ABDOMINOPLASTY    ? ADENOIDECTOMY    ? APPENDECTOMY  1995  ? AV FISTULA PLACEMENT Left 02/01/2017  ? Procedure: ARTERIOVENOUS BRACHIOCEPHALIC (AV) FISTULA CREATION;  Surgeon: FElam Dutch MD;  [facility]  Service: Vascular;  Laterality: Left;  ? CHOLECYSTECTOMY  1993  ? CORONARY ANGIOPLASTY WITH STENT PLACEMENT    ? CORONARY STENT INTERVENTION N/A 08/05/2016  ? Procedure: Coronary Stent Intervention;  Surgeon: BLorretta Harp MD;  [facility];  Service: Cardiovascular;  Laterality: N/A;  ? ESOPHAGOGASTRODUODENOSCOPY (EGD) WITH PROPOFOL N/A 07/01/2020  ? Procedure: ESOPHAGOGASTRODUODENOSCOPY (EGD) WITH PROPOFOL;  Surgeon: GGatha Mayer MD;  [facility]  Service: Endoscopy;  Laterality: N/A;  ? EXPLORATORY LAPAROTOMY  08/14/2005  ? lysis of adhesions, drainage of tubo-ovarian abscess  ? FISTULA SUPERFICIALIZATION Left 09/14/2017  ? Procedure: FISTULA SUPERFICIALIZATION ARTERIOVENOUS FISTULA LEFT ARM;  Surgeon: [name], MD;  Location: [facility];  Service: Vascular;  Laterality: Left;  ? FLEXIBLE SIGMOIDOSCOPY N/A 01/04/2019  ? Procedure: FLEXIBLE SIGMOIDOSCOPY;  Surgeon: Clarene Essex, MD;  Location: [facility];  Service: Endoscopy;  Laterality: N/A;  ? HYDRADENITIS EXCISION   02/08/2011  ? Procedure: EXCISION HYDRADENITIS GROIN;  Surgeon: [name], MD;  Location: [facility];  Service: General;  Laterality: N/A;  Excisioin of Hidradenitis Left groin  ? INCISION AND DRAINAGE ABSCESS N/A 01/11/2019  ? Procedure: INCISION AND DRAINAGE SACRAL ABSCESS;  Surgeon: Georganna Skeans, MD;  Location: [facility];  Service: General;  Laterality: N/A;  ? INGUINAL HIDRADENITIS EXCISION  07/06/2010  ? bilateral  ? INSERTION OF DIALYSIS CATHETER N/A 09/14/2017  ? Procedure: INSERTION OF TUNNELED DIALYSIS CATHETER Right Internal Jugular;  Surgeon: [name], MD;  Location: [facility];  Service: Vascular;  Laterality: N/A;  ? IR FLUORO GUIDE CV LINE RIGHT  03/01/2019  ? IR US GUIDE BX ASP/DRAIN  01/01/2019  ? IR US GUIDE VASC ACCESS RIGHT  03/01/2019  ? LEFT HEART CATH AND CORONARY ANGIOGRAPHY N/A 08/05/2016  ? Procedure: Left Heart Cath and Coronary Angiography;  Surgeon: [name], MD;  Location: [facility];  Service: Cardiovascular;  Laterality: N/A;  ? PERIPHERAL VASCULAR INTERVENTION Left 12/14/2018  ? Procedure: PERIPHERAL VASCULAR INTERVENTION;  Surgeon: [name], MD;  Location: [facility];  Service: Cardiovascular;  Laterality: Left;  SFA  ? PERIPHERAL VASCULAR INTERVENTION Left 04/25/2019  ? Procedure: PERIPHERAL VASCULAR INTERVENTION;  Surgeon: [name], MD;  Location: [facility];  Service: Cardiovascular;  Laterality: Left;  right superficial femoral, and left iliac  ? PERIPHERAL VASCULAR INTERVENTION Left 07/18/2019  ? Procedure: PERIPHERAL VASCULAR INTERVENTION;  Surgeon: [name], MD;  Location: [facility];  Service: Cardiovascular;  Laterality: Left;  external iliac  ? REDUCTION MAMMAPLASTY  2002  ? TONSILLECTOMY    ? TUBAL LIGATION  1996  ? VAGINAL HYSTERECTOMY  08/04/2005  ? and cysto  ? ? ?Family History  ?Problem Relation Age of Onset  ? Allergic rhinitis Mother   ? Hypertension Father   ? Allergic  rhinitis Father   ? Hypertension Sister   ? Allergic rhinitis Sister   ? Hypertension Brother   ? Allergic rhinitis Brother   ? Breast cancer Cousin   ?     x2  ? ? ?SOCIAL HISTORY: ?Social History  ? ?Tobacco Use  ? Smoking status: Former  ?  Years: 20.00  ?  Types: Cigarettes  ?  Quit date: 11/2016  ?  Years since quitting: 4.3  ? Smokeless tobacco: Never  ?Substance Use Topics  ? Alcohol use: No  ?  Alcohol/week: 0.0 standard drinks  ? ? ?Allergies  ?Allergen Reactions  ? Penicillins Anaphylaxis, Itching, Swelling, Rash and Other (See Comments)  ?  Swelling of throat & whole mouth  ?Has patient had a PCN reaction causing immediate rash, facial/tongue/throat swelling, SOB or lightheadedness with hypotension: Yes ?Has patient had a PCN reaction causing severe rash involving mucus membranes or skin necrosis: No ?Has patient had a PCN reaction that required hospitalization: No ?Has patient had a PCN reaction occurring within the last 10 years: No ?If all of the above answers are "NO", then may proceed with Cephalosporin use.  ?  Repatha [Evolocumab] Itching  ? Lisinopril Cough  ? ? ?Current Outpatient Medications  ?Medication Sig Dispense Refill  ? acetaminophen-codeine (TYLENOL #3) 300-30 MG tablet Take 1 tablet by mouth at bedtime as needed for moderate pain. 30 tablet 0  ? albuterol (VENTOLIN HFA) 108 (90 Base) MCG/ACT inhaler Inhale 1-2 puffs into the lungs every 6 (six) hours as needed for wheezing or shortness of breath. 18 g 2  ? amitriptyline (ELAVIL) 10 MG tablet Take 1 tablet (10 mg total) by mouth at bedtime. 30 tablet 1  ? amLODipine (NORVASC) 10 MG tablet TAKE 1 TABLET BY MOUTH EVERY DAY 90 tablet 3  ? aspirin 81 MG chewable tablet Chew 1 tablet (81 mg total) by mouth daily. 30 tablet 0  ? atorvastatin (LIPITOR) 40 MG tablet TAKE 1 TABLET BY MOUTH EVERY DAY 90 tablet 1  ? B Complex-C-Zn-Folic Acid (DIALYVITE/ZINC) TABS Take 1 tablet by mouth daily.    ? calcitRIOL (ROCALTROL) 0.5 MCG capsule Take 1.5 mcg  by mouth daily.    ? calcium acetate (PHOSLO) 667 MG capsule Take 667 mg by mouth 3 (three) times daily with meals.  10  ? clopidogrel (PLAVIX) 75 MG tablet TAKE 1 TABLET (75 MG TOTAL) BY MOUTH DAILY WITH BREAKF

## 2021-04-01 DIAGNOSIS — L299 Pruritus, unspecified: Secondary | ICD-10-CM | POA: Diagnosis not present

## 2021-04-01 DIAGNOSIS — N186 End stage renal disease: Secondary | ICD-10-CM | POA: Diagnosis not present

## 2021-04-01 DIAGNOSIS — Z992 Dependence on renal dialysis: Secondary | ICD-10-CM | POA: Diagnosis not present

## 2021-04-01 DIAGNOSIS — D509 Iron deficiency anemia, unspecified: Secondary | ICD-10-CM | POA: Diagnosis not present

## 2021-04-01 DIAGNOSIS — D631 Anemia in chronic kidney disease: Secondary | ICD-10-CM | POA: Diagnosis not present

## 2021-04-01 DIAGNOSIS — N2581 Secondary hyperparathyroidism of renal origin: Secondary | ICD-10-CM | POA: Diagnosis not present

## 2021-04-03 DIAGNOSIS — Z992 Dependence on renal dialysis: Secondary | ICD-10-CM | POA: Diagnosis not present

## 2021-04-03 DIAGNOSIS — N2581 Secondary hyperparathyroidism of renal origin: Secondary | ICD-10-CM | POA: Diagnosis not present

## 2021-04-03 DIAGNOSIS — D509 Iron deficiency anemia, unspecified: Secondary | ICD-10-CM | POA: Diagnosis not present

## 2021-04-03 DIAGNOSIS — L299 Pruritus, unspecified: Secondary | ICD-10-CM | POA: Diagnosis not present

## 2021-04-03 DIAGNOSIS — N186 End stage renal disease: Secondary | ICD-10-CM | POA: Diagnosis not present

## 2021-04-03 DIAGNOSIS — D631 Anemia in chronic kidney disease: Secondary | ICD-10-CM | POA: Diagnosis not present

## 2021-04-06 DIAGNOSIS — K5730 Diverticulosis of large intestine without perforation or abscess without bleeding: Secondary | ICD-10-CM | POA: Diagnosis not present

## 2021-04-06 DIAGNOSIS — K635 Polyp of colon: Secondary | ICD-10-CM | POA: Diagnosis not present

## 2021-04-06 DIAGNOSIS — D123 Benign neoplasm of transverse colon: Secondary | ICD-10-CM | POA: Diagnosis not present

## 2021-04-06 DIAGNOSIS — K219 Gastro-esophageal reflux disease without esophagitis: Secondary | ICD-10-CM | POA: Diagnosis not present

## 2021-04-06 DIAGNOSIS — Z1211 Encounter for screening for malignant neoplasm of colon: Secondary | ICD-10-CM | POA: Diagnosis not present

## 2021-04-06 DIAGNOSIS — R1319 Other dysphagia: Secondary | ICD-10-CM | POA: Diagnosis not present

## 2021-04-06 DIAGNOSIS — K3189 Other diseases of stomach and duodenum: Secondary | ICD-10-CM | POA: Diagnosis not present

## 2021-04-06 DIAGNOSIS — K2100 Gastro-esophageal reflux disease with esophagitis, without bleeding: Secondary | ICD-10-CM | POA: Diagnosis not present

## 2021-04-08 DIAGNOSIS — Z992 Dependence on renal dialysis: Secondary | ICD-10-CM | POA: Diagnosis not present

## 2021-04-08 DIAGNOSIS — N186 End stage renal disease: Secondary | ICD-10-CM | POA: Diagnosis not present

## 2021-04-08 DIAGNOSIS — D631 Anemia in chronic kidney disease: Secondary | ICD-10-CM | POA: Diagnosis not present

## 2021-04-08 DIAGNOSIS — D509 Iron deficiency anemia, unspecified: Secondary | ICD-10-CM | POA: Diagnosis not present

## 2021-04-08 DIAGNOSIS — N2581 Secondary hyperparathyroidism of renal origin: Secondary | ICD-10-CM | POA: Diagnosis not present

## 2021-04-08 DIAGNOSIS — L299 Pruritus, unspecified: Secondary | ICD-10-CM | POA: Diagnosis not present

## 2021-04-09 DIAGNOSIS — R1319 Other dysphagia: Secondary | ICD-10-CM | POA: Diagnosis not present

## 2021-04-09 DIAGNOSIS — K3189 Other diseases of stomach and duodenum: Secondary | ICD-10-CM | POA: Diagnosis not present

## 2021-04-09 DIAGNOSIS — K5730 Diverticulosis of large intestine without perforation or abscess without bleeding: Secondary | ICD-10-CM | POA: Diagnosis not present

## 2021-04-09 DIAGNOSIS — D123 Benign neoplasm of transverse colon: Secondary | ICD-10-CM | POA: Diagnosis not present

## 2021-04-09 DIAGNOSIS — K219 Gastro-esophageal reflux disease without esophagitis: Secondary | ICD-10-CM | POA: Diagnosis not present

## 2021-04-09 DIAGNOSIS — Z1211 Encounter for screening for malignant neoplasm of colon: Secondary | ICD-10-CM | POA: Diagnosis not present

## 2021-04-10 ENCOUNTER — Other Ambulatory Visit: Payer: Self-pay | Admitting: *Deleted

## 2021-04-10 DIAGNOSIS — Z992 Dependence on renal dialysis: Secondary | ICD-10-CM | POA: Diagnosis not present

## 2021-04-10 DIAGNOSIS — N2581 Secondary hyperparathyroidism of renal origin: Secondary | ICD-10-CM | POA: Diagnosis not present

## 2021-04-10 DIAGNOSIS — D631 Anemia in chronic kidney disease: Secondary | ICD-10-CM | POA: Diagnosis not present

## 2021-04-10 DIAGNOSIS — Z95828 Presence of other vascular implants and grafts: Secondary | ICD-10-CM

## 2021-04-10 DIAGNOSIS — N186 End stage renal disease: Secondary | ICD-10-CM | POA: Diagnosis not present

## 2021-04-10 DIAGNOSIS — I739 Peripheral vascular disease, unspecified: Secondary | ICD-10-CM

## 2021-04-10 DIAGNOSIS — L299 Pruritus, unspecified: Secondary | ICD-10-CM | POA: Diagnosis not present

## 2021-04-10 DIAGNOSIS — D509 Iron deficiency anemia, unspecified: Secondary | ICD-10-CM | POA: Diagnosis not present

## 2021-04-11 DIAGNOSIS — N186 End stage renal disease: Secondary | ICD-10-CM | POA: Diagnosis not present

## 2021-04-11 DIAGNOSIS — Z992 Dependence on renal dialysis: Secondary | ICD-10-CM | POA: Diagnosis not present

## 2021-04-11 DIAGNOSIS — E1122 Type 2 diabetes mellitus with diabetic chronic kidney disease: Secondary | ICD-10-CM | POA: Diagnosis not present

## 2021-04-13 DIAGNOSIS — E875 Hyperkalemia: Secondary | ICD-10-CM | POA: Diagnosis not present

## 2021-04-13 DIAGNOSIS — D631 Anemia in chronic kidney disease: Secondary | ICD-10-CM | POA: Diagnosis not present

## 2021-04-13 DIAGNOSIS — N186 End stage renal disease: Secondary | ICD-10-CM | POA: Diagnosis not present

## 2021-04-13 DIAGNOSIS — D509 Iron deficiency anemia, unspecified: Secondary | ICD-10-CM | POA: Diagnosis not present

## 2021-04-13 DIAGNOSIS — L299 Pruritus, unspecified: Secondary | ICD-10-CM | POA: Diagnosis not present

## 2021-04-13 DIAGNOSIS — N2581 Secondary hyperparathyroidism of renal origin: Secondary | ICD-10-CM | POA: Diagnosis not present

## 2021-04-13 DIAGNOSIS — Z992 Dependence on renal dialysis: Secondary | ICD-10-CM | POA: Diagnosis not present

## 2021-04-13 DIAGNOSIS — D649 Anemia, unspecified: Secondary | ICD-10-CM | POA: Diagnosis not present

## 2021-04-15 DIAGNOSIS — N2581 Secondary hyperparathyroidism of renal origin: Secondary | ICD-10-CM | POA: Diagnosis not present

## 2021-04-15 DIAGNOSIS — L299 Pruritus, unspecified: Secondary | ICD-10-CM | POA: Diagnosis not present

## 2021-04-15 DIAGNOSIS — Z992 Dependence on renal dialysis: Secondary | ICD-10-CM | POA: Diagnosis not present

## 2021-04-15 DIAGNOSIS — N186 End stage renal disease: Secondary | ICD-10-CM | POA: Diagnosis not present

## 2021-04-15 DIAGNOSIS — D509 Iron deficiency anemia, unspecified: Secondary | ICD-10-CM | POA: Diagnosis not present

## 2021-04-15 DIAGNOSIS — D631 Anemia in chronic kidney disease: Secondary | ICD-10-CM | POA: Diagnosis not present

## 2021-04-17 DIAGNOSIS — N2581 Secondary hyperparathyroidism of renal origin: Secondary | ICD-10-CM | POA: Diagnosis not present

## 2021-04-17 DIAGNOSIS — Z992 Dependence on renal dialysis: Secondary | ICD-10-CM | POA: Diagnosis not present

## 2021-04-17 DIAGNOSIS — L299 Pruritus, unspecified: Secondary | ICD-10-CM | POA: Diagnosis not present

## 2021-04-17 DIAGNOSIS — D631 Anemia in chronic kidney disease: Secondary | ICD-10-CM | POA: Diagnosis not present

## 2021-04-17 DIAGNOSIS — D509 Iron deficiency anemia, unspecified: Secondary | ICD-10-CM | POA: Diagnosis not present

## 2021-04-17 DIAGNOSIS — N186 End stage renal disease: Secondary | ICD-10-CM | POA: Diagnosis not present

## 2021-04-20 DIAGNOSIS — E1122 Type 2 diabetes mellitus with diabetic chronic kidney disease: Secondary | ICD-10-CM | POA: Diagnosis not present

## 2021-04-20 DIAGNOSIS — D631 Anemia in chronic kidney disease: Secondary | ICD-10-CM | POA: Diagnosis not present

## 2021-04-20 DIAGNOSIS — L299 Pruritus, unspecified: Secondary | ICD-10-CM | POA: Diagnosis not present

## 2021-04-20 DIAGNOSIS — D509 Iron deficiency anemia, unspecified: Secondary | ICD-10-CM | POA: Diagnosis not present

## 2021-04-20 DIAGNOSIS — N186 End stage renal disease: Secondary | ICD-10-CM | POA: Diagnosis not present

## 2021-04-20 DIAGNOSIS — N2581 Secondary hyperparathyroidism of renal origin: Secondary | ICD-10-CM | POA: Diagnosis not present

## 2021-04-20 DIAGNOSIS — Z992 Dependence on renal dialysis: Secondary | ICD-10-CM | POA: Diagnosis not present

## 2021-04-22 DIAGNOSIS — L299 Pruritus, unspecified: Secondary | ICD-10-CM | POA: Diagnosis not present

## 2021-04-22 DIAGNOSIS — N186 End stage renal disease: Secondary | ICD-10-CM | POA: Diagnosis not present

## 2021-04-22 DIAGNOSIS — D631 Anemia in chronic kidney disease: Secondary | ICD-10-CM | POA: Diagnosis not present

## 2021-04-22 DIAGNOSIS — N2581 Secondary hyperparathyroidism of renal origin: Secondary | ICD-10-CM | POA: Diagnosis not present

## 2021-04-22 DIAGNOSIS — D509 Iron deficiency anemia, unspecified: Secondary | ICD-10-CM | POA: Diagnosis not present

## 2021-04-22 DIAGNOSIS — Z992 Dependence on renal dialysis: Secondary | ICD-10-CM | POA: Diagnosis not present

## 2021-04-24 DIAGNOSIS — Z992 Dependence on renal dialysis: Secondary | ICD-10-CM | POA: Diagnosis not present

## 2021-04-24 DIAGNOSIS — D509 Iron deficiency anemia, unspecified: Secondary | ICD-10-CM | POA: Diagnosis not present

## 2021-04-24 DIAGNOSIS — L299 Pruritus, unspecified: Secondary | ICD-10-CM | POA: Diagnosis not present

## 2021-04-24 DIAGNOSIS — N186 End stage renal disease: Secondary | ICD-10-CM | POA: Diagnosis not present

## 2021-04-24 DIAGNOSIS — D631 Anemia in chronic kidney disease: Secondary | ICD-10-CM | POA: Diagnosis not present

## 2021-04-24 DIAGNOSIS — N2581 Secondary hyperparathyroidism of renal origin: Secondary | ICD-10-CM | POA: Diagnosis not present

## 2021-04-27 DIAGNOSIS — L299 Pruritus, unspecified: Secondary | ICD-10-CM | POA: Diagnosis not present

## 2021-04-27 DIAGNOSIS — N186 End stage renal disease: Secondary | ICD-10-CM | POA: Diagnosis not present

## 2021-04-27 DIAGNOSIS — Z992 Dependence on renal dialysis: Secondary | ICD-10-CM | POA: Diagnosis not present

## 2021-04-27 DIAGNOSIS — D631 Anemia in chronic kidney disease: Secondary | ICD-10-CM | POA: Diagnosis not present

## 2021-04-27 DIAGNOSIS — N2581 Secondary hyperparathyroidism of renal origin: Secondary | ICD-10-CM | POA: Diagnosis not present

## 2021-04-27 DIAGNOSIS — D509 Iron deficiency anemia, unspecified: Secondary | ICD-10-CM | POA: Diagnosis not present

## 2021-04-28 ENCOUNTER — Telehealth: Payer: Self-pay | Admitting: Family Medicine

## 2021-04-28 NOTE — Telephone Encounter (Signed)
Pt calling back to check status of placard.  Pt states it is to be turned in this week. ?Please call her at [phone] ?

## 2021-04-28 NOTE — Telephone Encounter (Signed)
Copied from [place] [id number]. Topic: General - Other ?>> Apr 28, 2021 11:06 AM Erick Blinks wrote: ?Reason for CRM: Pt called requesting a call back from the office regarding her handicap placard form. She says it is due this week and wants a call back with a status update ?Best contact: [phone] ?

## 2021-04-29 DIAGNOSIS — L299 Pruritus, unspecified: Secondary | ICD-10-CM | POA: Diagnosis not present

## 2021-04-29 DIAGNOSIS — D631 Anemia in chronic kidney disease: Secondary | ICD-10-CM | POA: Diagnosis not present

## 2021-04-29 DIAGNOSIS — N2581 Secondary hyperparathyroidism of renal origin: Secondary | ICD-10-CM | POA: Diagnosis not present

## 2021-04-29 DIAGNOSIS — N186 End stage renal disease: Secondary | ICD-10-CM | POA: Diagnosis not present

## 2021-04-29 DIAGNOSIS — D509 Iron deficiency anemia, unspecified: Secondary | ICD-10-CM | POA: Diagnosis not present

## 2021-04-29 DIAGNOSIS — Z992 Dependence on renal dialysis: Secondary | ICD-10-CM | POA: Diagnosis not present

## 2021-04-29 NOTE — Telephone Encounter (Signed)
Form has been placed in PCP box for signature ?

## 2021-04-29 NOTE — Telephone Encounter (Signed)
Pt has been called and informed of form being ready ?

## 2021-04-30 ENCOUNTER — Telehealth: Payer: Self-pay

## 2021-04-30 NOTE — Telephone Encounter (Signed)
Patient left VM on triage line. States she would like Rx for a "3 wheel mobile scooter" ? ? ?CB [phone] ?

## 2021-05-01 ENCOUNTER — Telehealth: Payer: Self-pay | Admitting: Family Medicine

## 2021-05-01 DIAGNOSIS — D631 Anemia in chronic kidney disease: Secondary | ICD-10-CM | POA: Diagnosis not present

## 2021-05-01 DIAGNOSIS — N186 End stage renal disease: Secondary | ICD-10-CM | POA: Diagnosis not present

## 2021-05-01 DIAGNOSIS — Z992 Dependence on renal dialysis: Secondary | ICD-10-CM | POA: Diagnosis not present

## 2021-05-01 DIAGNOSIS — L299 Pruritus, unspecified: Secondary | ICD-10-CM | POA: Diagnosis not present

## 2021-05-01 DIAGNOSIS — N2581 Secondary hyperparathyroidism of renal origin: Secondary | ICD-10-CM | POA: Diagnosis not present

## 2021-05-01 DIAGNOSIS — D509 Iron deficiency anemia, unspecified: Secondary | ICD-10-CM | POA: Diagnosis not present

## 2021-05-01 NOTE — Telephone Encounter (Signed)
Does patient qualify? ?

## 2021-05-01 NOTE — Telephone Encounter (Signed)
Pt called requesting a Rx for a 3 wheel mobile scooter, please advise. She says "through [facility]"  ? ?Please advise  ?

## 2021-05-01 NOTE — Telephone Encounter (Signed)
Will route to patient's PCP. ?

## 2021-05-02 NOTE — Telephone Encounter (Signed)
No she doesn't ?

## 2021-05-04 DIAGNOSIS — Z992 Dependence on renal dialysis: Secondary | ICD-10-CM | POA: Diagnosis not present

## 2021-05-04 DIAGNOSIS — N186 End stage renal disease: Secondary | ICD-10-CM | POA: Diagnosis not present

## 2021-05-04 DIAGNOSIS — L299 Pruritus, unspecified: Secondary | ICD-10-CM | POA: Diagnosis not present

## 2021-05-04 DIAGNOSIS — N2581 Secondary hyperparathyroidism of renal origin: Secondary | ICD-10-CM | POA: Diagnosis not present

## 2021-05-04 DIAGNOSIS — D631 Anemia in chronic kidney disease: Secondary | ICD-10-CM | POA: Diagnosis not present

## 2021-05-04 DIAGNOSIS — D509 Iron deficiency anemia, unspecified: Secondary | ICD-10-CM | POA: Diagnosis not present

## 2021-05-04 NOTE — Telephone Encounter (Signed)
Pt called in wanting to speak with someone about the scooter, please advise.  ?

## 2021-05-04 NOTE — Telephone Encounter (Signed)
Call placed to patient, she said she  is really in a lot of pain and her mobility has suffered.  The surgeon told her that surgery is not an option for her LLE pain. She needs assistance with personal care and her sister has been able to help her as needed.  She would like to discuss her need for the motorized scooter with Dr Margarita Rana as she feels it is necessary at this time due to her decreased mobility.    ?Appointment scheduled with Dr Margarita Rana for tomorrow - 05/05/2021.   ?

## 2021-05-05 ENCOUNTER — Telehealth: Payer: Self-pay

## 2021-05-05 ENCOUNTER — Ambulatory Visit: Payer: Medicare Other | Attending: Family Medicine | Admitting: Family Medicine

## 2021-05-05 ENCOUNTER — Encounter: Payer: Self-pay | Admitting: Family Medicine

## 2021-05-05 DIAGNOSIS — N186 End stage renal disease: Secondary | ICD-10-CM | POA: Insufficient documentation

## 2021-05-05 DIAGNOSIS — E1136 Type 2 diabetes mellitus with diabetic cataract: Secondary | ICD-10-CM | POA: Insufficient documentation

## 2021-05-05 DIAGNOSIS — I120 Hypertensive chronic kidney disease with stage 5 chronic kidney disease or end stage renal disease: Secondary | ICD-10-CM | POA: Insufficient documentation

## 2021-05-05 DIAGNOSIS — M79659 Pain in unspecified thigh: Secondary | ICD-10-CM | POA: Insufficient documentation

## 2021-05-05 DIAGNOSIS — Z992 Dependence on renal dialysis: Secondary | ICD-10-CM | POA: Diagnosis not present

## 2021-05-05 DIAGNOSIS — E1122 Type 2 diabetes mellitus with diabetic chronic kidney disease: Secondary | ICD-10-CM | POA: Diagnosis not present

## 2021-05-05 DIAGNOSIS — E1143 Type 2 diabetes mellitus with diabetic autonomic (poly)neuropathy: Secondary | ICD-10-CM | POA: Diagnosis not present

## 2021-05-05 DIAGNOSIS — M25559 Pain in unspecified hip: Secondary | ICD-10-CM | POA: Insufficient documentation

## 2021-05-05 DIAGNOSIS — R2689 Other abnormalities of gait and mobility: Secondary | ICD-10-CM | POA: Diagnosis not present

## 2021-05-05 DIAGNOSIS — J45909 Unspecified asthma, uncomplicated: Secondary | ICD-10-CM | POA: Diagnosis not present

## 2021-05-05 DIAGNOSIS — K3184 Gastroparesis: Secondary | ICD-10-CM | POA: Diagnosis not present

## 2021-05-05 DIAGNOSIS — E1151 Type 2 diabetes mellitus with diabetic peripheral angiopathy without gangrene: Secondary | ICD-10-CM | POA: Insufficient documentation

## 2021-05-05 DIAGNOSIS — G8929 Other chronic pain: Secondary | ICD-10-CM | POA: Insufficient documentation

## 2021-05-05 DIAGNOSIS — I2510 Atherosclerotic heart disease of native coronary artery without angina pectoris: Secondary | ICD-10-CM | POA: Diagnosis not present

## 2021-05-05 MED ORDER — MISC. DEVICES MISC: 0 refills | Status: DC

## 2021-05-05 NOTE — Progress Notes (Signed)
Pain in left hip. ? ?

## 2021-05-05 NOTE — Telephone Encounter (Signed)
Call placed to [facility] # [phone] to inquire about what is needed from the provider to initiate a referral.  Spoke to [place] who said that the first step is for the patient to call [facility] and they will explain the options to her.  She needs to provide them with her insurance information. They will then fax the documents to the provider that need to be completed/signed in order to process the referral.  ?No order is needed at this time, the patient just needs to call them.  Per Lennette Bihari, the patient does not need a PT eval.  ? ?Call placed to patient and informed her of above information and provided her with the phone number for [facility]. Instructed her  to call [facility] and to call this CM back with any questions.  ?

## 2021-05-05 NOTE — Progress Notes (Signed)
? ?Subjective:  ?Patient ID: Susan Horton, female    DOB: [birth year]  Age: 51 y.o. MRN: [id number] ? ?CC: Hip Pain ? ? ?HPI ?Susan Horton is a 51 y.o. year old female with a history of Type 2 Diabetes Mellitus (A1c 4.9 from care everywhere 04/20/2021), Gastroparesis, (s/p Botox injection of the pylorus) Hypertension, CAD, ESRD on hemodialysis, Asthma ? ?Interval History: ?She would like to obtain an electric scooter as her L leg gives out all the time and she needs to be able to move. Her cane and walker can only assist her to a limited extent and she has a hard time doing her groceries especially if they do not have a scooter for her to use.  At the [place], she was unable to get up after the fall. ?Pain is 10/10 in her L lateral upper thigh and is burning and is always present. Nothing really helps with pain. 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 ? ?Past Medical History:  ?Diagnosis Date  ? Abnormal stress test   ? Anemia   ? Angio-edema   ? Asthma   ? CAD (coronary artery disease)   ? DES to mid LAD July 2018, residual moderate RCA disease  ? Cataract   ? CKD (chronic kidney disease) stage 4, GFR 15-29 ml/min (HCC)   ? Dialysis T/Th/Sa  ? DVT (deep venous thrombosis) ([place])   ? 1996 during pregnancy, 2015 left leg  ? Eczema   ? Essential hypertension 12/11/2015  ? Gastroparesis   ? GERD (gastroesophageal reflux disease)   ? Gluteal abscess 12/27/2018  ? Hidradenitis   ? Migraine   ? Neuropathy   ? Peripheral vascular disease ([place])   ? blood clot in leg  ? Progressive angina ([place]) 06/05/2014  ? Chest pain  ? Type 2 diabetes mellitus ([place])   ? Type II diabetes mellitus ([place])   ? Urticaria   ? ? ?Past Surgical History:  ?Procedure Laterality Date  ? A/V FISTULAGRAM N/A 06/22/2017  ? Procedure: A/V FISTULAGRAM - left arm;  Surgeon: [name], MD;  Location: [facility];  Service: Cardiovascular;  Laterality: N/A;  ? ABDOMINAL AORTOGRAM W/LOWER EXTREMITY Bilateral 12/14/2018  ?  Procedure: ABDOMINAL AORTOGRAM W/LOWER EXTREMITY;  Surgeon: [name], MD;  Location: [facility];  Service: Cardiovascular;  Laterality: Bilateral;  ? ABDOMINAL AORTOGRAM W/LOWER EXTREMITY N/A 04/25/2019  ? Procedure: ABDOMINAL AORTOGRAM W/LOWER EXTREMITY;  Surgeon: [name], MD;  Location: [facility];  Service: Cardiovascular;  Laterality: N/A;  ? ABDOMINAL AORTOGRAM W/LOWER EXTREMITY Left 07/18/2019  ? Procedure: ABDOMINAL AORTOGRAM W/LOWER EXTREMITY;  Surgeon: [name], MD;  Location: [facility];  Service: Cardiovascular;  Laterality: Left;  ? ABDOMINOPLASTY    ? ADENOIDECTOMY    ? APPENDECTOMY  1995  ? AV FISTULA PLACEMENT Left 02/01/2017  ? Procedure: ARTERIOVENOUS BRACHIOCEPHALIC (AV) FISTULA CREATION;  Surgeon: [name], MD;  Location: [facility];  Service: Vascular;  Laterality: Left;  ? CHOLECYSTECTOMY  1993  ? CORONARY ANGIOPLASTY WITH STENT PLACEMENT    ? CORONARY STENT INTERVENTION N/A 08/05/2016  ? Procedure: Coronary Stent Intervention;  Surgeon: [name], MD;  Location: [facility];  Service: Cardiovascular;  Laterality: N/A;  ? ESOPHAGOGASTRODUODENOSCOPY (EGD) WITH PROPOFOL N/A 07/01/2020  ? Procedure: ESOPHAGOGASTRODUODENOSCOPY (EGD) WITH PROPOFOL;  Surgeon: Gatha Mayer, MD;  Location: [facility];  Service: Endoscopy;  Laterality: N/A;  ? EXPLORATORY LAPAROTOMY  08/14/2005  ?  lysis of adhesions, drainage of tubo-ovarian abscess  ? FISTULA SUPERFICIALIZATION Left 09/14/2017  ? Procedure: FISTULA SUPERFICIALIZATION ARTERIOVENOUS FISTULA LEFT ARM;  Surgeon: [name], MD;  Location: [facility];  Service: Vascular;  Laterality: Left;  ? FLEXIBLE SIGMOIDOSCOPY N/A 01/04/2019  ? Procedure: FLEXIBLE SIGMOIDOSCOPY;  Surgeon: Clarene Essex, MD;  Location: [facility];  Service: Endoscopy;  Laterality: N/A;  ? HYDRADENITIS EXCISION  02/08/2011  ? Procedure: EXCISION HYDRADENITIS GROIN;  Surgeon: [name], MD;  Location:  [facility];  Service: General;  Laterality: N/A;  Excisioin of Hidradenitis Left groin  ? INCISION AND DRAINAGE ABSCESS N/A 01/11/2019  ? Procedure: INCISION AND DRAINAGE SACRAL ABSCESS;  Surgeon: Georganna Skeans, MD;  Location: [facility];  Service: General;  Laterality: N/A;  ? INGUINAL HIDRADENITIS EXCISION  07/06/2010  ? bilateral  ? INSERTION OF DIALYSIS CATHETER N/A 09/14/2017  ? Procedure: INSERTION OF TUNNELED DIALYSIS CATHETER Right Internal Jugular;  Surgeon: [name], MD;  Location: [facility];  Service: Vascular;  Laterality: N/A;  ? IR FLUORO GUIDE CV LINE RIGHT  03/01/2019  ? IR US GUIDE BX ASP/DRAIN  01/01/2019  ? IR US GUIDE VASC ACCESS RIGHT  03/01/2019  ? LEFT HEART CATH AND CORONARY ANGIOGRAPHY N/A 08/05/2016  ? Procedure: Left Heart Cath and Coronary Angiography;  Surgeon: [name], MD;  Location: [facility];  Service: Cardiovascular;  Laterality: N/A;  ? PERIPHERAL VASCULAR INTERVENTION Left 12/14/2018  ? Procedure: PERIPHERAL VASCULAR INTERVENTION;  Surgeon: [name], MD;  Location: [facility];  Service: Cardiovascular;  Laterality: Left;  SFA  ? PERIPHERAL VASCULAR INTERVENTION Left 04/25/2019  ? Procedure: PERIPHERAL VASCULAR INTERVENTION;  Surgeon: [name], MD;  Location: [facility];  Service: Cardiovascular;  Laterality: Left;  right superficial femoral, and left iliac  ? PERIPHERAL VASCULAR INTERVENTION Left 07/18/2019  ? Procedure: PERIPHERAL VASCULAR INTERVENTION;  Surgeon: [name], MD;  Location: [facility];  Service: Cardiovascular;  Laterality: Left;  external iliac  ? REDUCTION MAMMAPLASTY  2002  ? TONSILLECTOMY    ? TUBAL LIGATION  1996  ? VAGINAL HYSTERECTOMY  08/04/2005  ? and cysto  ? ? ?Family History  ?Problem Relation Age of Onset  ? Allergic rhinitis Mother   ? Hypertension Father   ? Allergic rhinitis Father   ? Hypertension Sister   ? Allergic rhinitis Sister   ? Hypertension Brother   ?  Allergic rhinitis Brother   ? Breast cancer Cousin   ?     x2  ? ? ?Social History  ? ?Socioeconomic History  ? Marital status: Single  ?  Spouse name: Not on file  ? Number of children: 3  ? Years of education: 42  ? Highest education level: Not on file  ?Occupational History  ? Occupation: Curator  ?Tobacco Use  ? Smoking status: Former  ?  Years: 20.00  ?  Types: Cigarettes  ?  Quit date: 11/2016  ?  Years since quitting: 4.4  ? Smokeless tobacco: Never  ?Vaping Use  ? Vaping Use: Never used  ?Substance and Sexual Activity  ? Alcohol use: No  ?  Alcohol/week: 0.0 standard drinks  ? Drug use: Yes  ?  Frequency: 2.0 times per week  ?  Types: Marijuana  ? Sexual activity: Not on file  ?Other Topics Concern  ? Not on file  ?Social History Narrative  ? Lives with daughter in a 2 story home.  Has 3 children.  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  Education: college.   ? ?Social Determinants of Health  ? ?Financial Resource Strain: Not on file  ?Food Insecurity: No Food Insecurity  ? Worried About Charity fundraiser in the Last Year: Never true  ? Ran Out of Food in the Last Year: Never true  ?Transportation Needs: No Transportation Needs  ? Lack of Transportation (Medical): No  ? Lack of Transportation (Non-Medical): No  ?Physical Activity: Inactive  ? Days of Exercise per Week: 0 days  ? Minutes of Exercise per Session: 0 min  ?Stress: Not on file  ?Social Connections: Unknown  ? Frequency of Communication with Friends and Family: More than three times a week  ? Frequency of Social Gatherings with Friends and Family: More than three times a week  ? Attends Religious Services: 1 to 4 times per year  ? Active Member of Clubs or Organizations: No  ? Attends Archivist Meetings: Never  ? Marital Status: Not on file  ? ? ?Allergies  ?Allergen Reactions  ? Penicillins Anaphylaxis, Itching, Swelling, Rash and Other (See Comments)  ?  Swelling of throat & whole mouth  ?Has  patient had a PCN reaction causing immediate rash, facial/tongue/throat swelling, SOB or lightheadedness with hypotension: Yes ?Has patient had a PCN reaction causing severe rash involving mucus membranes or skin

## 2021-05-05 NOTE — Patient Instructions (Signed)

## 2021-05-06 DIAGNOSIS — D509 Iron deficiency anemia, unspecified: Secondary | ICD-10-CM | POA: Diagnosis not present

## 2021-05-06 DIAGNOSIS — N2581 Secondary hyperparathyroidism of renal origin: Secondary | ICD-10-CM | POA: Diagnosis not present

## 2021-05-06 DIAGNOSIS — N186 End stage renal disease: Secondary | ICD-10-CM | POA: Diagnosis not present

## 2021-05-06 DIAGNOSIS — L299 Pruritus, unspecified: Secondary | ICD-10-CM | POA: Diagnosis not present

## 2021-05-06 DIAGNOSIS — Z992 Dependence on renal dialysis: Secondary | ICD-10-CM | POA: Diagnosis not present

## 2021-05-06 DIAGNOSIS — D631 Anemia in chronic kidney disease: Secondary | ICD-10-CM | POA: Diagnosis not present

## 2021-05-08 DIAGNOSIS — D509 Iron deficiency anemia, unspecified: Secondary | ICD-10-CM | POA: Diagnosis not present

## 2021-05-08 DIAGNOSIS — Z992 Dependence on renal dialysis: Secondary | ICD-10-CM | POA: Diagnosis not present

## 2021-05-08 DIAGNOSIS — L299 Pruritus, unspecified: Secondary | ICD-10-CM | POA: Diagnosis not present

## 2021-05-08 DIAGNOSIS — D631 Anemia in chronic kidney disease: Secondary | ICD-10-CM | POA: Diagnosis not present

## 2021-05-08 DIAGNOSIS — N186 End stage renal disease: Secondary | ICD-10-CM | POA: Diagnosis not present

## 2021-05-08 DIAGNOSIS — N2581 Secondary hyperparathyroidism of renal origin: Secondary | ICD-10-CM | POA: Diagnosis not present

## 2021-05-10 ENCOUNTER — Emergency Department (HOSPITAL_COMMUNITY): Payer: Medicare Other

## 2021-05-10 ENCOUNTER — Encounter (HOSPITAL_COMMUNITY): Payer: Self-pay | Admitting: Emergency Medicine

## 2021-05-10 ENCOUNTER — Other Ambulatory Visit: Payer: Self-pay

## 2021-05-10 ENCOUNTER — Emergency Department (HOSPITAL_COMMUNITY)
Admission: EM | Admit: 2021-05-10 | Discharge: 2021-05-10 | Disposition: A | Discharge status: Home / Self Care | Payer: Medicare Other | Attending: Emergency Medicine | Admitting: Emergency Medicine

## 2021-05-10 DIAGNOSIS — M25552 Pain in left hip: Secondary | ICD-10-CM | POA: Diagnosis not present

## 2021-05-10 DIAGNOSIS — M79605 Pain in left leg: Secondary | ICD-10-CM | POA: Diagnosis not present

## 2021-05-10 DIAGNOSIS — Z7902 Long term (current) use of antithrombotics/antiplatelets: Secondary | ICD-10-CM | POA: Insufficient documentation

## 2021-05-10 DIAGNOSIS — Z7982 Long term (current) use of aspirin: Secondary | ICD-10-CM | POA: Diagnosis not present

## 2021-05-10 DIAGNOSIS — N186 End stage renal disease: Secondary | ICD-10-CM | POA: Diagnosis not present

## 2021-05-10 DIAGNOSIS — M25551 Pain in right hip: Secondary | ICD-10-CM | POA: Diagnosis not present

## 2021-05-10 DIAGNOSIS — Z992 Dependence on renal dialysis: Secondary | ICD-10-CM | POA: Diagnosis not present

## 2021-05-10 DIAGNOSIS — Z79899 Other long term (current) drug therapy: Secondary | ICD-10-CM | POA: Insufficient documentation

## 2021-05-10 DIAGNOSIS — E1122 Type 2 diabetes mellitus with diabetic chronic kidney disease: Secondary | ICD-10-CM | POA: Insufficient documentation

## 2021-05-10 MED ORDER — HYDROCODONE-ACETAMINOPHEN 10-325 MG PO TABS: 0.5000 | ORAL_TABLET | Freq: Three times a day (TID) | ORAL | 0 refills | Status: DC | PRN

## 2021-05-10 MED ORDER — OXYCODONE-ACETAMINOPHEN 5-325 MG PO TABS
1.0000 | ORAL_TABLET | Freq: Once | ORAL | Status: AC
  Administered 2021-05-10: 1 via ORAL
  Filled 2021-05-10: qty 1

## 2021-05-10 NOTE — ED Triage Notes (Signed)
Patient with history of peripheral neuropathy and chronic pain and numbness in left leg here with complaint of new right leg pain and numbness that started approximately one and a half week ago. Patient denies recent injury, states the numbness called her to fall yesterday. Patient is alert, oriented, and in no apparent distress at this time. ?

## 2021-05-10 NOTE — Discharge Instructions (Signed)
Please only take your pain medications every 8 to 12 hours as your kidneys will not clear the medication as quickly and there is a potential for narcotic overdose. ?Follow up as directed. ?

## 2021-05-10 NOTE — ED Provider Notes (Signed)
?[facility] ?Provider Note ? ? ?CSN: [id number] ?Arrival date & time: 05/10/21  1057 ? ?  ? ?History ? ?Chief Complaint  ?Patient presents with  ? Leg Pain  ? ? ?Susan Horton is a 51 y.o. female with a past medical history of end-stage renal disease on hemodialysis, secondary hyperparathyroidism, type 2 diabetes, and history of tumoral calcinosis of the left hip.  She has had ongoing and severe pain in her left hip for some time and takes Tylenol for pain relief.  Patient reports that now her right hip is also hurting severely.  She states that she has not been able to sleep because she cannot find any comfortable position.  She is denying any new injuries.  She is concerned she may be developing the same thing on her right hip. ? ? ?Leg Pain ? ?  ? ?Home Medications ?Prior to Admission medications   ?Medication Sig Start Date End Date Taking? Authorizing Provider  ?acetaminophen-codeine (TYLENOL #3) 300-30 MG tablet Take 1 tablet by mouth at bedtime as needed for moderate pain. 01/29/21   Charlott Rakes, MD  ?albuterol (VENTOLIN HFA) 108 (90 Base) MCG/ACT inhaler Inhale 1-2 puffs into the lungs every 6 (six) hours as needed for wheezing or shortness of breath. 07/02/20   Mariel Aloe, MD  ?amitriptyline (ELAVIL) 10 MG tablet Take 1 tablet (10 mg total) by mouth at bedtime. 09/19/18   Jamse Arn, MD  ?amLODipine (NORVASC) 10 MG tablet TAKE 1 TABLET BY MOUTH EVERY DAY 07/28/20   [name], MD  ?aspirin 81 MG chewable tablet Chew 1 tablet (81 mg total) by mouth daily. 08/07/16   Cheryln Manly, NP  ?atorvastatin (LIPITOR) 40 MG tablet TAKE 1 TABLET BY MOUTH EVERY DAY 12/22/20   [name], MD  ?B Complex-C-Zn-Folic Acid (DIALYVITE/ZINC) TABS Take 1 tablet by mouth daily. 03/25/20   [provider]  ?calcitRIOL (ROCALTROL) 0.5 MCG capsule Take 1.5 mcg by mouth daily. 09/11/18   [provider]  ?calcium acetate (PHOSLO) 667 MG capsule Take  667 mg by mouth 3 (three) times daily with meals. 09/23/17   [provider]  ?clopidogrel (PLAVIX) 75 MG tablet TAKE 1 TABLET (75 MG TOTAL) BY MOUTH DAILY WITH BREAKFAST. 07/28/20   [name], MD  ?cyclobenzaprine (FLEXERIL) 10 MG tablet Take 1 tablet (10 mg total) by mouth 2 (two) times daily as needed. 08/06/20   Charlott Rakes, MD  ?Fluticasone-Salmeterol (ADVAIR) 250-50 MCG/DOSE AEPB Inhale 1 puff into the lungs 2 (two) times daily. 06/10/17   Charlott Rakes, MD  ?hydrALAZINE (APRESOLINE) 50 MG tablet Take 50 mg by mouth every 8 (eight) hours. 06/18/20   [provider]  ?ipratropium-albuterol (DUONEB) 0.5-2.5 (3) MG/3ML SOLN Take 3 mLs by nebulization every 6 (six) hours as needed. 07/02/20   Mariel Aloe, MD  ?iron sucrose (VENOFER) 20 MG/ML injection Iron Sucrose (Venofer) 07/11/19   [provider]  ?isosorbide mononitrate (IMDUR) 30 MG 24 hr tablet TAKE 0.5 TABLETS (15 MG TOTAL) BY MOUTH DAILY. 07/28/20 10/26/20  [name], MD  ?Methoxy PEG-Epoetin Beta (MIRCERA IJ) Inject into the skin. 07/09/19   [provider]  ?metoprolol succinate (TOPROL-XL) 50 MG 24 hr tablet TAKE 1.5 TABLETS BY MOUTH DAILY 07/28/20   [name], MD  ?[place] MISC Electric scooter for mobility 05/05/21   Charlott Rakes, MD  ?montelukast (SINGULAIR) 10 MG tablet Take 1 tablet (10 mg total) by mouth at  bedtime. 04/12/18   Kennith Gain, MD  ?nitroGLYCERIN (NITROSTAT) 0.4 MG SL tablet Place 1 tablet (0.4 mg total) under the tongue every 5 (five) minutes as needed for chest pain. 08/06/19   [name], MD  ?ondansetron (ZOFRAN ODT) 4 MG disintegrating tablet Take 1 tablet (4 mg total) by mouth every 8 (eight) hours as needed for nausea or vomiting. 07/02/20   Mariel Aloe, MD  ?pantoprazole (PROTONIX) 40 MG tablet Take 1 tablet (40 mg total) by mouth daily. 07/02/20   Mariel Aloe, MD  ?PRALUENT 75 MG/ML SOAJ INJECT 75 MG INTO THE SKIN EVERY 14 (FOURTEEN)  DAYS. 02/07/20   [name], MD  ?sucralfate (CARAFATE) 1 GM/10ML suspension Take 10 mLs (1 g total) by mouth 4 (four) times daily -  with meals and at bedtime. 03/22/20   Jacqlyn Larsen, PA-C  ?   ? ?Allergies    ?Penicillins, Repatha [evolocumab], and Lisinopril   ? ?Review of Systems   ?Review of Systems ? ?Physical Exam ?Updated Vital Signs ?BP (!) 159/84   Pulse 77   Temp 98.6 ?F (37 ?C) (Oral)   Resp 17   SpO2 100%  ?Physical Exam ?Vitals and nursing note reviewed.  ?Constitutional:   ?   General: She is not in acute distress. ?   Appearance: She is well-developed. She is not diaphoretic.  ?HENT:  ?   Head: Normocephalic and atraumatic.  ?   Right Ear: External ear normal.  ?   Left Ear: External ear normal.  ?   Nose: Nose normal.  ?   Mouth/Throat:  ?   Mouth: Mucous membranes are moist.  ?Eyes:  ?   General: No scleral icterus. ?   Conjunctiva/sclera: Conjunctivae normal.  ?Cardiovascular:  ?   Rate and Rhythm: Normal rate and regular rhythm.  ?   Heart sounds: Normal heart sounds. No murmur heard. ?  No friction rub. No gallop.  ?Pulmonary:  ?   Effort: Pulmonary effort is normal. No respiratory distress.  ?   Breath sounds: Normal breath sounds.  ?Abdominal:  ?   General: Bowel sounds are normal. There is no distension.  ?   Palpations: Abdomen is soft. There is no mass.  ?   Tenderness: There is no abdominal tenderness. There is no guarding.  ?Musculoskeletal:  ?   Cervical back: Normal range of motion.  ?   Comments: Palpable deformity of the left hip, tenderness to palpation over the right femoral head, range of motion limited.  DP and PT pulse intact bilateral  ?Skin: ?   General: Skin is warm and dry.  ?Neurological:  ?   Mental Status: She is alert and oriented to person, place, and time.  ?Psychiatric:     ?   Behavior: Behavior normal.  ? ? ?ED Results / Procedures / Treatments   ?Labs ?(all labs ordered are listed, but only abnormal results are displayed) ?Labs Reviewed - No data to  display ? ?EKG ?None ? ?Radiology ?DG Hip Unilat  With Pelvis 2-3 Views Right ? ?Result Date: 05/10/2021 ?CLINICAL DATA:  A 51 year old female presents with masses LEFT hip. Pain in RIGHT hip as well. EXAM: DG HIP (WITH OR WITHOUT PELVIS) 2-3V RIGHT COMPARISON:  MRI of August 27, 2020. FINDINGS: Signs of vascular stenting overlying the LEFT sacrum iliac. Enlarging area of suspected tumoral calcinosis about the LEFT proximal femur and hip measuring 13 x 11 cm as compared to approximately 7.5 x 6.3 cm  in 2022. No gross underlying femoral abnormality though only single view is provided. No signs of bony lesion or acute bone process related to the bony pelvis. RIGHT hip is located. Subtle areas of calcification are noted in the soft tissues adjacent to the RIGHT proximal femur. No signs of RIGHT hip fracture. Extensive vascular stenting along the course femoral vasculature in the RIGHT lower extremity. IMPRESSION: 1. Enlarging area of suspected tumoral calcinosis about the LEFT proximal femur and hip measuring 13 x 11 cm as compared to approximately 7.5 x 6.3 cm in 2022. 2. Developing tumoral calcinosis about the RIGHT hip. 3. No signs of fracture about the RIGHT hip. Imaging of the LEFT hip limited by overlying calcified masslike area. Electronically Signed   By: Zetta Bills M.D.   On: 05/10/2021 12:24   ? ?Procedures ?Procedures  ? ? ?Medications Ordered in ED ?Medications - No data to display ? ?ED Course/ Medical Decision Making/ A&P ?  ?                        ?Medical Decision Making ?Patient here with bilateral hip pain now worsening on the right. ?No recent injuries.  She appears to have developing tumoral calcinosis on the right hip.  Patient primarily here for pain control.  She has been using Tylenol.  She has seen Dr. Sharol Given in the past. ?She has been unable to sleep and is in severe pain.  I reviewed the PDMP and will provide some narcotics for pain relief.  I have discussed renal dosing with the patient  and she understands that she needs to be very judicious with this medication so that she does not have a buildup of the narcotic in her system.  I have advised her that she should follow very closely with her PCP

## 2021-05-11 ENCOUNTER — Ambulatory Visit (INDEPENDENT_AMBULATORY_CARE_PROVIDER_SITE_OTHER): Payer: Medicare Other | Admitting: Orthopedic Surgery

## 2021-05-11 DIAGNOSIS — D649 Anemia, unspecified: Secondary | ICD-10-CM | POA: Diagnosis not present

## 2021-05-11 DIAGNOSIS — E1122 Type 2 diabetes mellitus with diabetic chronic kidney disease: Secondary | ICD-10-CM | POA: Diagnosis not present

## 2021-05-11 DIAGNOSIS — N186 End stage renal disease: Secondary | ICD-10-CM | POA: Diagnosis not present

## 2021-05-11 DIAGNOSIS — D631 Anemia in chronic kidney disease: Secondary | ICD-10-CM | POA: Diagnosis not present

## 2021-05-11 DIAGNOSIS — G8929 Other chronic pain: Secondary | ICD-10-CM

## 2021-05-11 DIAGNOSIS — Z992 Dependence on renal dialysis: Secondary | ICD-10-CM | POA: Diagnosis not present

## 2021-05-11 DIAGNOSIS — L299 Pruritus, unspecified: Secondary | ICD-10-CM | POA: Diagnosis not present

## 2021-05-11 DIAGNOSIS — D509 Iron deficiency anemia, unspecified: Secondary | ICD-10-CM | POA: Diagnosis not present

## 2021-05-11 DIAGNOSIS — N2581 Secondary hyperparathyroidism of renal origin: Secondary | ICD-10-CM | POA: Diagnosis not present

## 2021-05-11 MED ORDER — ACETAMINOPHEN-CODEINE #3 300-30 MG PO TABS: 1.0000 | ORAL_TABLET | Freq: Every evening | ORAL | 0 refills | Status: DC | PRN

## 2021-05-11 NOTE — Telephone Encounter (Signed)
Was holding for pt appt. It is today at 4pm will discuss at visit.  ?

## 2021-05-12 NOTE — Telephone Encounter (Signed)
I called the patient and explained that [facility] told me that they would be faxing documents for Dr Margarita Rana to sign and this clinic did not have to send them any information yet.  The patient said that she spoke to [facility] about her options and she will call them again to clarify what is needed. I told the patient that we will send the order and visit notes that we have but [facility] will still need to send Dr Margarita Rana the exact order/specifications for signature. The patient was very appreciative and said she would call this clinic back after contacting [facility] again  ?

## 2021-05-12 NOTE — Telephone Encounter (Signed)
Pt had some follow up questions regarding getting a [facility] / making a special appt needed to get one, please advise.  ?

## 2021-05-12 NOTE — Telephone Encounter (Signed)
This is a duplicate message.

## 2021-05-13 ENCOUNTER — Telehealth: Payer: Self-pay | Admitting: Family Medicine

## 2021-05-13 DIAGNOSIS — L299 Pruritus, unspecified: Secondary | ICD-10-CM | POA: Diagnosis not present

## 2021-05-13 DIAGNOSIS — Z992 Dependence on renal dialysis: Secondary | ICD-10-CM | POA: Diagnosis not present

## 2021-05-13 DIAGNOSIS — D631 Anemia in chronic kidney disease: Secondary | ICD-10-CM | POA: Diagnosis not present

## 2021-05-13 DIAGNOSIS — N186 End stage renal disease: Secondary | ICD-10-CM | POA: Diagnosis not present

## 2021-05-13 DIAGNOSIS — D509 Iron deficiency anemia, unspecified: Secondary | ICD-10-CM | POA: Diagnosis not present

## 2021-05-13 DIAGNOSIS — N2581 Secondary hyperparathyroidism of renal origin: Secondary | ICD-10-CM | POA: Diagnosis not present

## 2021-05-13 NOTE — Telephone Encounter (Signed)
Copied from [place]. Topic: General - Other ?>> May 13, 2021 10:39 AM Valere Dross wrote: ?Reason for CRM: Judson Roch from [facility] called in about pts last appt on 04/25, that discussed her being able to get a motorized scooter, and they are requesting the office Notes for that visit to be faxed over at [phone], please advise. ?

## 2021-05-13 NOTE — Telephone Encounter (Signed)
Office note has been faxed.

## 2021-05-15 DIAGNOSIS — N2581 Secondary hyperparathyroidism of renal origin: Secondary | ICD-10-CM | POA: Diagnosis not present

## 2021-05-15 DIAGNOSIS — D631 Anemia in chronic kidney disease: Secondary | ICD-10-CM | POA: Diagnosis not present

## 2021-05-15 DIAGNOSIS — N186 End stage renal disease: Secondary | ICD-10-CM | POA: Diagnosis not present

## 2021-05-15 DIAGNOSIS — Z992 Dependence on renal dialysis: Secondary | ICD-10-CM | POA: Diagnosis not present

## 2021-05-15 DIAGNOSIS — D509 Iron deficiency anemia, unspecified: Secondary | ICD-10-CM | POA: Diagnosis not present

## 2021-05-15 DIAGNOSIS — L299 Pruritus, unspecified: Secondary | ICD-10-CM | POA: Diagnosis not present

## 2021-05-18 DIAGNOSIS — N186 End stage renal disease: Secondary | ICD-10-CM | POA: Diagnosis not present

## 2021-05-18 DIAGNOSIS — N2581 Secondary hyperparathyroidism of renal origin: Secondary | ICD-10-CM | POA: Diagnosis not present

## 2021-05-18 DIAGNOSIS — D509 Iron deficiency anemia, unspecified: Secondary | ICD-10-CM | POA: Diagnosis not present

## 2021-05-18 DIAGNOSIS — D631 Anemia in chronic kidney disease: Secondary | ICD-10-CM | POA: Diagnosis not present

## 2021-05-18 DIAGNOSIS — Z992 Dependence on renal dialysis: Secondary | ICD-10-CM | POA: Diagnosis not present

## 2021-05-18 DIAGNOSIS — L299 Pruritus, unspecified: Secondary | ICD-10-CM | POA: Diagnosis not present

## 2021-05-20 DIAGNOSIS — N2581 Secondary hyperparathyroidism of renal origin: Secondary | ICD-10-CM | POA: Diagnosis not present

## 2021-05-20 DIAGNOSIS — L299 Pruritus, unspecified: Secondary | ICD-10-CM | POA: Diagnosis not present

## 2021-05-20 DIAGNOSIS — D631 Anemia in chronic kidney disease: Secondary | ICD-10-CM | POA: Diagnosis not present

## 2021-05-20 DIAGNOSIS — D509 Iron deficiency anemia, unspecified: Secondary | ICD-10-CM | POA: Diagnosis not present

## 2021-05-20 DIAGNOSIS — Z992 Dependence on renal dialysis: Secondary | ICD-10-CM | POA: Diagnosis not present

## 2021-05-20 DIAGNOSIS — N186 End stage renal disease: Secondary | ICD-10-CM | POA: Diagnosis not present

## 2021-05-22 DIAGNOSIS — D509 Iron deficiency anemia, unspecified: Secondary | ICD-10-CM | POA: Diagnosis not present

## 2021-05-22 DIAGNOSIS — D631 Anemia in chronic kidney disease: Secondary | ICD-10-CM | POA: Diagnosis not present

## 2021-05-22 DIAGNOSIS — L299 Pruritus, unspecified: Secondary | ICD-10-CM | POA: Diagnosis not present

## 2021-05-22 DIAGNOSIS — N186 End stage renal disease: Secondary | ICD-10-CM | POA: Diagnosis not present

## 2021-05-22 DIAGNOSIS — N2581 Secondary hyperparathyroidism of renal origin: Secondary | ICD-10-CM | POA: Diagnosis not present

## 2021-05-22 DIAGNOSIS — Z992 Dependence on renal dialysis: Secondary | ICD-10-CM | POA: Diagnosis not present

## 2021-05-25 ENCOUNTER — Encounter: Payer: Self-pay | Admitting: Orthopedic Surgery

## 2021-05-25 DIAGNOSIS — D631 Anemia in chronic kidney disease: Secondary | ICD-10-CM | POA: Diagnosis not present

## 2021-05-25 DIAGNOSIS — N186 End stage renal disease: Secondary | ICD-10-CM | POA: Diagnosis not present

## 2021-05-25 DIAGNOSIS — L299 Pruritus, unspecified: Secondary | ICD-10-CM | POA: Diagnosis not present

## 2021-05-25 DIAGNOSIS — Z992 Dependence on renal dialysis: Secondary | ICD-10-CM | POA: Diagnosis not present

## 2021-05-25 DIAGNOSIS — D509 Iron deficiency anemia, unspecified: Secondary | ICD-10-CM | POA: Diagnosis not present

## 2021-05-25 DIAGNOSIS — N2581 Secondary hyperparathyroidism of renal origin: Secondary | ICD-10-CM | POA: Diagnosis not present

## 2021-05-25 NOTE — Progress Notes (Signed)
? ?Office Visit Note ?  ?Patient: Susan Horton           ?Date of Birth: May 12, 1970           ?MRN: [id number] ?Visit Date: 05/11/2021 ?             ?Requested by: Charlott Rakes, MD ?[address] ?[address] ?[place],  [place] [address] ?PCP: Charlott Rakes, MD ? ?Chief Complaint  ?Patient presents with  ? Left Hip - Follow-up  ? ? ? ? ?HPI: ?Patient is a 51 year old woman presents in follow-up for tumoral calcinosis mass lateral left hip.  She is type II diabetic end-stage renal disease on dialysis.  Patient receives sodium thiosulfate during dialysis.  Patient states she cannot tell the difference.  Previous radiographs shows no intra-articular hip pathology.  Patient states she has had no relief with Neurontin or Lyrica.  She states she does have stents in her leg. ? ?Assessment & Plan: ?Visit Diagnoses:  ?1. Tumoral calcinosis   ?2. Other chronic pain   ? ? ?Plan: A prescription for Tylenol 3 was called and we will set her up for physical therapy for quad strengthening. ? ?Follow-Up Instructions: Return if symptoms worsen or fail to improve.  ? ?Ortho Exam ? ?Patient is alert, oriented, no adenopathy, well-dressed, normal affect, normal respiratory effort. ?Examination patient has a large mass lateral left hip there is no skin breakdown no color changes no drainage no cellulitis. ? ?Imaging: ?No results found. ?No images are attached to the encounter. ? ?Labs: ?Lab Results  ?Component Value Date  ? HGBA1C 4.9 06/30/2020  ? HGBA1C 7.5 (H) 12/26/2018  ? HGBA1C 9.8 (A) 03/22/2018  ? ESRSEDRATE 132 (H) 01/12/2019  ? CRP 2.9 (H) 02/25/2019  ? CRP 22.8 (H) 01/12/2019  ? REPTSTATUS 03/06/2019 FINAL 03/01/2019  ? REPTSTATUS 03/02/2019 FINAL 03/01/2019  ? GRAMSTAIN  03/01/2019  ?  WBC PRESENT,BOTH PMN AND MONONUCLEAR ?NO ORGANISMS SEEN ?CYTOSPIN SMEAR ?Performed at [facility], [place] [address] [place], [place] [address] ?  ? CULT  03/01/2019  ?  NO GROWTH 5 DAYS ?Performed at [facility], [place] [address], [place], [place] [address] ?  ? [place] 03/09/2008  ? ? ? ?Lab Results  ?Component Value Date  ? ALBUMIN 3.3 (L) 06/29/2020  ? ALBUMIN 3.8 03/22/2020  ? ALBUMIN 1.9 (L) 03/02/2019  ? ? ?Lab Results  ?Component Value Date  ? MG 2.1 07/01/2020  ? ?No results found for: VD25OH ? ?No results found for: PREALBUMIN ? ?  Latest Ref Rng & Units 07/01/2020  ?  8:32 AM 06/29/2020  ?  1:10 PM 03/22/2020  ? 11:35 AM  ?CBC EXTENDED  ?WBC 4.0 - 10.5 K/uL 5.9   5.5   8.1    ?RBC 3.87 - 5.11 MIL/uL 4.07   4.42   4.71    ?Hemoglobin 12.0 - 15.0 g/dL 9.7   10.6   12.3    ?HCT 36.0 - 46.0 % 32.2   36.2   39.7    ?Platelets 150 - 400 K/uL 224   212   327    ?NEUT# 1.7 - 7.7 K/uL  3.9     ?Lymph# 0.7 - 4.0 K/uL  1.1     ? ? ? ?There is no height or weight on file to calculate BMI. ? ?Orders:  ?Orders Placed This Encounter  ?Procedures  ? Ambulatory referral to Physical Therapy  ? ?Meds ordered this encounter  ?Medications  ?  acetaminophen-codeine (TYLENOL #3) 300-30 MG tablet  ?  Sig: Take 1 tablet by mouth at bedtime as needed for moderate pain.  ?  Dispense:  30 tablet  ?  Refill:  0  ? ? ? Procedures: ?No procedures performed ? ?Clinical Data: ?No additional findings. ? ?ROS: ? ?All other systems negative, except as noted in the HPI. ?Review of Systems ? ?Objective: ?Vital Signs: There were no vitals taken for this visit. ? ?Specialty Comments:  ?No specialty comments available. ? ?PMFS History: ?Patient Active Problem List  ? Diagnosis Date Noted  ? Diabetes mellitus ([place]) 01/29/2021  ? Hypertension associated with stage 5 chronic kidney disease due to type 2 diabetes mellitus ([place]) 01/29/2021  ? Tumoral calcinosis 01/29/2021  ? Dysphagia   ? Nausea 09/12/2019  ? Diarrhea, unspecified 09/12/2019  ? Pruritus, unspecified 08/13/2019  ? Anaphylactic shock, unspecified, sequela 08/13/2019  ? Allergy, unspecified, sequela 08/13/2019  ? Unspecified cataract 03/19/2019  ? Unspecified asthma with (acute) exacerbation 03/19/2019   ? COVID-19 03/06/2019  ? Intractable nausea and vomiting 02/24/2019  ? Abdominal pain, epigastric 02/24/2019  ? Multifocal pneumonia 02/24/2019  ? Shortness of breath 01/19/2019  ? Cerebrovascular small vessel disease   ? Generalized weakness   ? Abscess of sacrum ([place])   ? Gluteal abscess 12/25/2018  ? Moderate protein-calorie malnutrition ([place]) 12/19/2018  ? Other disorders resulting from impaired renal tubular function 12/19/2018  ? Liver disease, unspecified 12/19/2018  ? Hyperkalemia 12/19/2018  ? Acidosis 12/19/2018  ? Hyperlipidemia 11/10/2018  ? History of arteriosclerotic vascular disease 11/10/2018  ? Carotid stenosis 11/10/2018  ? Symptomatic anemia 09/16/2018  ? Peripheral vascular disease, unspecified ([place]) 09/04/2018  ? Other disorders of bilirubin metabolism 01/20/2018  ? Other long term (current) drug therapy 11/28/2017  ? Other abnormal findings in urine 11/28/2017  ? Dependence on renal dialysis ([place]) 11/28/2017  ? ESRD on peritoneal dialysis ([place]) 10/11/2017  ? Complication of vascular dialysis catheter 09/15/2017  ? History of anemia due to CKD 07/18/2017  ? DVT (deep vein thrombosis) in pregnancy 07/18/2017  ? Encounter for removal of sutures 07/07/2017  ? Iron deficiency anemia, unspecified 06/30/2017  ? Secondary hyperparathyroidism of renal origin ([place]) 06/24/2017  ? Pain, unspecified 06/24/2017  ? Fever, unspecified 06/24/2017  ? End stage renal disease ([place]) 06/24/2017  ? Coagulation defect, unspecified ([place]) 06/24/2017  ? Anemia in chronic kidney disease 06/24/2017  ? Chronic diastolic CHF (congestive heart failure) ([place]) 06/18/2017  ? Moderate persistent asthma 06/10/2017  ? Adjustment disorder with depressed mood 09/08/2016  ? CKD (chronic kidney disease) stage 4, GFR 15-29 ml/min (HCC)   ? CAD S/P mLAD PCI with DES 08/06/2016  ? Presence of drug coated stent in LAD coronary artery 08/06/2016  ? Atherosclerotic heart disease of native coronary artery without angina pectoris 07/11/2016   ? Carotid bruit present 06/15/2016  ? Dyslipidemia 06/15/2016  ? Smoker 06/15/2016  ? Neuropathy 03/10/2016  ? Essential hypertension, benign 12/11/2015  ? Left facial pain 03/04/2015  ? Abnormal sense of taste 03/04/2015  ? Gastroparesis   ? Uncontrolled type 2 diabetes mellitus with peripheral neuropathy 06/05/2014  ? Yeast infection of the vagina 07/10/2010  ? ?Past Medical History:  ?Diagnosis Date  ? Abnormal stress test   ? Anemia   ? Angio-edema   ? Asthma   ? CAD (coronary artery disease)   ? DES to mid LAD July 2018, residual moderate RCA disease  ? Cataract   ? CKD (chronic kidney disease) stage 4, GFR 15-29  ml/min ([place])   ? Dialysis T/Th/Sa  ? DVT (deep venous thrombosis) ([place])   ? 1996 during pregnancy, 2015 left leg  ? Eczema   ? Essential hypertension 12/11/2015  ? Gastroparesis   ? GERD (gastroesophageal reflux disease)   ? Gluteal abscess 12/27/2018  ? Hidradenitis   ? Migraine   ? Neuropathy   ? Peripheral vascular disease ([place])   ? blood clot in leg  ? Progressive angina ([place]) 06/05/2014  ? Chest pain  ? Type 2 diabetes mellitus ([place])   ? Type II diabetes mellitus ([place])   ? Urticaria   ?  ?Family History  ?Problem Relation Age of Onset  ? Allergic rhinitis Mother   ? Hypertension Father   ? Allergic rhinitis Father   ? Hypertension Sister   ? Allergic rhinitis Sister   ? Hypertension Brother   ? Allergic rhinitis Brother   ? Breast cancer Cousin   ?     x2  ?  ?Past Surgical History:  ?Procedure Laterality Date  ? A/V FISTULAGRAM N/A 06/22/2017  ? Procedure: A/V FISTULAGRAM - left arm;  Surgeon: [name], MD;  Location: [facility];  Service: Cardiovascular;  Laterality: N/A;  ? ABDOMINAL AORTOGRAM W/LOWER EXTREMITY Bilateral 12/14/2018  ? Procedure: ABDOMINAL AORTOGRAM W/LOWER EXTREMITY;  Surgeon: [name], MD;  Location: [facility];  Service: Cardiovascular;  Laterality: Bilateral;  ? ABDOMINAL AORTOGRAM W/LOWER EXTREMITY N/A 04/25/2019  ? Procedure:  ABDOMINAL AORTOGRAM W/LOWER EXTREMITY;  Surgeon: [name], MD;  Location: [facility];  Service: Cardiovascular;  Laterality: N/A;  ? ABDOMINAL AORTOGRAM W/LOWER EXTREMITY Left 07/18/2019

## 2021-05-26 ENCOUNTER — Ambulatory Visit: Payer: Medicare Other | Attending: Orthopedic Surgery

## 2021-05-26 DIAGNOSIS — R262 Difficulty in walking, not elsewhere classified: Secondary | ICD-10-CM

## 2021-05-26 DIAGNOSIS — R2681 Unsteadiness on feet: Secondary | ICD-10-CM

## 2021-05-26 DIAGNOSIS — G8929 Other chronic pain: Secondary | ICD-10-CM | POA: Diagnosis not present

## 2021-05-26 DIAGNOSIS — M6281 Muscle weakness (generalized): Secondary | ICD-10-CM | POA: Diagnosis not present

## 2021-05-26 NOTE — Therapy (Signed)
?OUTPATIENT PHYSICAL THERAPY NEURO EVALUATION ? ? ?Patient Name: Susan Horton ?MRN: [id number] ?DOB:15-May-1970, 51 y.o., female ?Today's Date: 05/26/2021 ? ?PCP: Charlott Rakes, MD ?REFERRING PROVIDER: Newt Minion., MD ? ? PT End of Session - 05/26/21 1238   ? ? Visit Number 1   ? Number of Visits 4   ? Date for PT Re-Evaluation 07/07/21   ? Authorization Type Medicare A&B/ BCBS supplemental 2023   ? Progress Note Due on Visit 10   ? PT Start Time 1145   ? PT Stop Time 1230   ? PT Time Calculation (min) 45 min   ? Equipment Utilized During Treatment Gait belt   ? Activity Tolerance Patient tolerated treatment well;Patient limited by fatigue   ? Behavior During Therapy [facility] for tasks assessed/performed   ? ?  ?  ? ?  ? ? ?Past Medical History:  ?Diagnosis Date  ? Abnormal stress test   ? Anemia   ? Angio-edema   ? Asthma   ? CAD (coronary artery disease)   ? DES to mid LAD July 2018, residual moderate RCA disease  ? Cataract   ? CKD (chronic kidney disease) stage 4, GFR 15-29 ml/min (HCC)   ? Dialysis T/Th/Sa  ? DVT (deep venous thrombosis) ([place])   ? 1996 during pregnancy, 2015 left leg  ? Eczema   ? Essential hypertension 12/11/2015  ? Gastroparesis   ? GERD (gastroesophageal reflux disease)   ? Gluteal abscess 12/27/2018  ? Hidradenitis   ? Migraine   ? Neuropathy   ? Peripheral vascular disease ([place])   ? blood clot in leg  ? Progressive angina ([place]) 06/05/2014  ? Chest pain  ? Type 2 diabetes mellitus ([place])   ? Type II diabetes mellitus ([place])   ? Urticaria   ? ?Past Surgical History:  ?Procedure Laterality Date  ? A/V FISTULAGRAM N/A 06/22/2017  ? Procedure: A/V FISTULAGRAM - left arm;  Surgeon: [name], MD;  Location: [facility];  Service: Cardiovascular;  Laterality: N/A;  ? ABDOMINAL AORTOGRAM W/LOWER EXTREMITY Bilateral 12/14/2018  ? Procedure: ABDOMINAL AORTOGRAM W/LOWER EXTREMITY;  Surgeon: [name], MD;  Location: [facility];  Service: Cardiovascular;  Laterality:  Bilateral;  ? ABDOMINAL AORTOGRAM W/LOWER EXTREMITY N/A 04/25/2019  ? Procedure: ABDOMINAL AORTOGRAM W/LOWER EXTREMITY;  Surgeon: [name], MD;  Location: [facility];  Service: Cardiovascular;  Laterality: N/A;  ? ABDOMINAL AORTOGRAM W/LOWER EXTREMITY Left 07/18/2019  ? Procedure: ABDOMINAL AORTOGRAM W/LOWER EXTREMITY;  Surgeon: [name], MD;  Location: [facility];  Service: Cardiovascular;  Laterality: Left;  ? ABDOMINOPLASTY    ? ADENOIDECTOMY    ? APPENDECTOMY  1995  ? AV FISTULA PLACEMENT Left 02/01/2017  ? Procedure: ARTERIOVENOUS BRACHIOCEPHALIC (AV) FISTULA CREATION;  Surgeon: [name], MD;  Location: [facility];  Service: Vascular;  Laterality: Left;  ? CHOLECYSTECTOMY  1993  ? CORONARY ANGIOPLASTY WITH STENT PLACEMENT    ? CORONARY STENT INTERVENTION N/A 08/05/2016  ? Procedure: Coronary Stent Intervention;  Surgeon: [name], MD;  Location: [facility];  Service: Cardiovascular;  Laterality: N/A;  ? ESOPHAGOGASTRODUODENOSCOPY (EGD) WITH PROPOFOL N/A 07/01/2020  ? Procedure: ESOPHAGOGASTRODUODENOSCOPY (EGD) WITH PROPOFOL;  Surgeon: Gatha Mayer, MD;  Location: [facility];  Service: Endoscopy;  Laterality: N/A;  ? EXPLORATORY LAPAROTOMY  08/14/2005  ? lysis of adhesions, drainage of tubo-ovarian abscess  ? FISTULA SUPERFICIALIZATION Left 09/14/2017  ? Procedure: FISTULA SUPERFICIALIZATION ARTERIOVENOUS FISTULA LEFT  ARM;  Surgeon: [name], MD;  Location: [facility];  Service: Vascular;  Laterality: Left;  ? FLEXIBLE SIGMOIDOSCOPY N/A 01/04/2019  ? Procedure: FLEXIBLE SIGMOIDOSCOPY;  Surgeon: Clarene Essex, MD;  Location: [facility];  Service: Endoscopy;  Laterality: N/A;  ? HYDRADENITIS EXCISION  02/08/2011  ? Procedure: EXCISION HYDRADENITIS GROIN;  Surgeon: [name], MD;  Location: [facility];  Service: General;  Laterality: N/A;  Excisioin of Hidradenitis Left groin  ? INCISION AND DRAINAGE ABSCESS N/A 01/11/2019  ?  Procedure: INCISION AND DRAINAGE SACRAL ABSCESS;  Surgeon: Georganna Skeans, MD;  Location: [facility];  Service: General;  Laterality: N/A;  ? INGUINAL HIDRADENITIS EXCISION  07/06/2010  ? bilateral  ? INSERTION OF DIALYSIS CATHETER N/A 09/14/2017  ? Procedure: INSERTION OF TUNNELED DIALYSIS CATHETER Right Internal Jugular;  Surgeon: [name], MD;  Location: [facility];  Service: Vascular;  Laterality: N/A;  ? IR FLUORO GUIDE CV LINE RIGHT  03/01/2019  ? IR US GUIDE BX ASP/DRAIN  01/01/2019  ? IR US GUIDE VASC ACCESS RIGHT  03/01/2019  ? LEFT HEART CATH AND CORONARY ANGIOGRAPHY N/A 08/05/2016  ? Procedure: Left Heart Cath and Coronary Angiography;  Surgeon: [name], MD;  Location: [facility];  Service: Cardiovascular;  Laterality: N/A;  ? PERIPHERAL VASCULAR INTERVENTION Left 12/14/2018  ? Procedure: PERIPHERAL VASCULAR INTERVENTION;  Surgeon: [name], MD;  Location: [facility];  Service: Cardiovascular;  Laterality: Left;  SFA  ? PERIPHERAL VASCULAR INTERVENTION Left 04/25/2019  ? Procedure: PERIPHERAL VASCULAR INTERVENTION;  Surgeon: [name], MD;  Location: [facility];  Service: Cardiovascular;  Laterality: Left;  right superficial femoral, and left iliac  ? PERIPHERAL VASCULAR INTERVENTION Left 07/18/2019  ? Procedure: PERIPHERAL VASCULAR INTERVENTION;  Surgeon: [name], MD;  Location: [facility];  Service: Cardiovascular;  Laterality: Left;  external iliac  ? REDUCTION MAMMAPLASTY  2002  ? TONSILLECTOMY    ? TUBAL LIGATION  1996  ? VAGINAL HYSTERECTOMY  08/04/2005  ? and cysto  ? ?Patient Active Problem List  ? Diagnosis Date Noted  ? Diabetes mellitus ([place]) 01/29/2021  ? Hypertension associated with stage 5 chronic kidney disease due to type 2 diabetes mellitus ([place]) 01/29/2021  ? Tumoral calcinosis 01/29/2021  ? Dysphagia   ? Nausea 09/12/2019  ? Diarrhea, unspecified 09/12/2019  ? Pruritus, unspecified 08/13/2019  ? Anaphylactic shock,  unspecified, sequela 08/13/2019  ? Allergy, unspecified, sequela 08/13/2019  ? Unspecified cataract 03/19/2019  ? Unspecified asthma with (acute) exacerbation 03/19/2019  ? COVID-19 03/06/2019  ? Intractable nausea and vomiting 02/24/2019  ? Abdominal pain, epigastric 02/24/2019  ? Multifocal pneumonia 02/24/2019  ? Shortness of breath 01/19/2019  ? Cerebrovascular small vessel disease   ? Generalized weakness   ? Abscess of sacrum ([place])   ? Gluteal abscess 12/25/2018  ? Moderate protein-calorie malnutrition ([place]) 12/19/2018  ? Other disorders resulting from impaired renal tubular function 12/19/2018  ? Liver disease, unspecified 12/19/2018  ? Hyperkalemia 12/19/2018  ? Acidosis 12/19/2018  ? Hyperlipidemia 11/10/2018  ? History of arteriosclerotic vascular disease 11/10/2018  ? Carotid stenosis 11/10/2018  ? Symptomatic anemia 09/16/2018  ? Peripheral vascular disease, unspecified ([place]) 09/04/2018  ? Other disorders of bilirubin metabolism 01/20/2018  ? Other long term (current) drug therapy 11/28/2017  ? Other abnormal findings in urine 11/28/2017  ? Dependence on renal dialysis ([place]) 11/28/2017  ? ESRD on peritoneal dialysis ([place]) 10/11/2017  ? Complication of vascular dialysis  catheter 09/15/2017  ? History of anemia due to CKD 07/18/2017  ? DVT (deep vein thrombosis) in pregnancy 07/18/2017  ? Encounter for removal of sutures 07/07/2017  ? Iron deficiency anemia, unspecified 06/30/2017  ? Secondary hyperparathyroidism of renal origin ([place]) 06/24/2017  ? Pain, unspecified 06/24/2017  ? Fever, unspecified 06/24/2017  ? End stage renal disease ([place]) 06/24/2017  ? Coagulation defect, unspecified ([place]) 06/24/2017  ? Anemia in chronic kidney disease 06/24/2017  ? Chronic diastolic CHF (congestive heart failure) ([place]) 06/18/2017  ? Moderate persistent asthma 06/10/2017  ? Adjustment disorder with depressed mood 09/08/2016  ? CKD (chronic kidney disease) stage 4, GFR 15-29 ml/min (HCC)   ? CAD S/P mLAD PCI with DES  08/06/2016  ? Presence of drug coated stent in LAD coronary artery 08/06/2016  ? Atherosclerotic heart disease of native coronary artery without angina pectoris 07/11/2016  ? Carotid bruit present 06/15/2016

## 2021-05-27 DIAGNOSIS — Z992 Dependence on renal dialysis: Secondary | ICD-10-CM | POA: Diagnosis not present

## 2021-05-27 DIAGNOSIS — L299 Pruritus, unspecified: Secondary | ICD-10-CM | POA: Diagnosis not present

## 2021-05-27 DIAGNOSIS — N2581 Secondary hyperparathyroidism of renal origin: Secondary | ICD-10-CM | POA: Diagnosis not present

## 2021-05-27 DIAGNOSIS — D509 Iron deficiency anemia, unspecified: Secondary | ICD-10-CM | POA: Diagnosis not present

## 2021-05-27 DIAGNOSIS — N186 End stage renal disease: Secondary | ICD-10-CM | POA: Diagnosis not present

## 2021-05-27 DIAGNOSIS — D631 Anemia in chronic kidney disease: Secondary | ICD-10-CM | POA: Diagnosis not present

## 2021-05-29 DIAGNOSIS — N186 End stage renal disease: Secondary | ICD-10-CM | POA: Diagnosis not present

## 2021-05-29 DIAGNOSIS — Z992 Dependence on renal dialysis: Secondary | ICD-10-CM | POA: Diagnosis not present

## 2021-05-29 DIAGNOSIS — D631 Anemia in chronic kidney disease: Secondary | ICD-10-CM | POA: Diagnosis not present

## 2021-05-29 DIAGNOSIS — L299 Pruritus, unspecified: Secondary | ICD-10-CM | POA: Diagnosis not present

## 2021-05-29 DIAGNOSIS — N2581 Secondary hyperparathyroidism of renal origin: Secondary | ICD-10-CM | POA: Diagnosis not present

## 2021-05-29 DIAGNOSIS — D509 Iron deficiency anemia, unspecified: Secondary | ICD-10-CM | POA: Diagnosis not present

## 2021-06-01 DIAGNOSIS — D509 Iron deficiency anemia, unspecified: Secondary | ICD-10-CM | POA: Diagnosis not present

## 2021-06-01 DIAGNOSIS — L299 Pruritus, unspecified: Secondary | ICD-10-CM | POA: Diagnosis not present

## 2021-06-01 DIAGNOSIS — Z992 Dependence on renal dialysis: Secondary | ICD-10-CM | POA: Diagnosis not present

## 2021-06-01 DIAGNOSIS — N2581 Secondary hyperparathyroidism of renal origin: Secondary | ICD-10-CM | POA: Diagnosis not present

## 2021-06-01 DIAGNOSIS — N186 End stage renal disease: Secondary | ICD-10-CM | POA: Diagnosis not present

## 2021-06-01 DIAGNOSIS — D631 Anemia in chronic kidney disease: Secondary | ICD-10-CM | POA: Diagnosis not present

## 2021-06-02 ENCOUNTER — Encounter: Payer: Self-pay | Admitting: Physical Therapy

## 2021-06-02 ENCOUNTER — Ambulatory Visit: Payer: Medicare Other | Admitting: Physical Therapy

## 2021-06-02 DIAGNOSIS — M6281 Muscle weakness (generalized): Secondary | ICD-10-CM

## 2021-06-02 DIAGNOSIS — G8929 Other chronic pain: Secondary | ICD-10-CM | POA: Diagnosis not present

## 2021-06-02 DIAGNOSIS — R262 Difficulty in walking, not elsewhere classified: Secondary | ICD-10-CM | POA: Diagnosis not present

## 2021-06-02 DIAGNOSIS — R2681 Unsteadiness on feet: Secondary | ICD-10-CM

## 2021-06-02 NOTE — Patient Instructions (Signed)
Access Code: [email or web address] URL: [email or web address] Date: 06/02/2021 Prepared by: [facility]  Exercises - Supine Quadricep Sets  - 1 x daily - 3-4 x weekly - 2-3 sets - 10 reps - 3 sec hold - Supine Gluteal Sets  - 1 x daily - 3-4 x weekly - 2 sets - 10 reps - 3 sec hold - Hip Flexor/Ankle strengthening  - 1-2 x daily - 7 x weekly - 1 sets - 3 reps - 30-60 sec hold - Side to side weightshift  - 1-2 x daily - 7 x weekly - 1-2 sets - 10 reps - Standing Lumbar Spine Flexion Stretch Counter  - 1-2 x daily - 7 x weekly - 1-2 sets - 10 reps

## 2021-06-02 NOTE — Therapy (Signed)
OUTPATIENT PHYSICAL THERAPY TREATMENT NOTE   Patient Name: Susan Horton MRN: [id number] DOB:[birth year], 51 y.o., female Today's Date: 06/02/2021  PCP: Charlott Rakes, MD REFERRING PROVIDER: Newt Minion., MD    END OF SESSION:   PT End of Session - 06/02/21 1521     Visit Number 2    Number of Visits 4    Date for PT Re-Evaluation 07/07/21    Authorization Type Medicare A&B/ BCBS supplemental 2023    Progress Note Due on Visit 10    PT Start Time 1237    PT Stop Time 1310    PT Time Calculation (min) 33 min    Equipment Utilized During Treatment Gait belt    Activity Tolerance Patient tolerated treatment well;Patient limited by fatigue;Patient limited by pain   continues to report 10/10 pain throughout session   Behavior During Therapy [facility] for tasks assessed/performed             Past Medical History:  Diagnosis Date   Abnormal stress test    Anemia    Angio-edema    Asthma    CAD (coronary artery disease)    DES to mid LAD July 2018, residual moderate RCA disease   Cataract    CKD (chronic kidney disease) stage 4, GFR 15-29 ml/min ([place])    Dialysis T/Th/Sa   DVT (deep venous thrombosis) ([place])    1996 during pregnancy, 2015 left leg   Eczema    Essential hypertension 12/11/2015   Gastroparesis    GERD (gastroesophageal reflux disease)    Gluteal abscess 12/27/2018   Hidradenitis    Migraine    Neuropathy    Peripheral vascular disease ([place])    blood clot in leg   Progressive angina ([place]) 06/05/2014   Chest pain   Type 2 diabetes mellitus ([place])    Type II diabetes mellitus ([place])    Urticaria    Past Surgical History:  Procedure Laterality Date   A/V FISTULAGRAM N/A 06/22/2017   Procedure: A/V FISTULAGRAM - left arm;  Surgeon: [name], MD;  Location: [place] [facility];  Service: Cardiovascular;  Laterality: N/A;   ABDOMINAL AORTOGRAM W/LOWER EXTREMITY Bilateral 12/14/2018   Procedure: ABDOMINAL AORTOGRAM W/LOWER EXTREMITY;  Surgeon:  [name], MD;  Location: [place] [facility];  Service: Cardiovascular;  Laterality: Bilateral;   ABDOMINAL AORTOGRAM W/LOWER EXTREMITY N/A 04/25/2019   Procedure: ABDOMINAL AORTOGRAM W/LOWER EXTREMITY;  Surgeon: [name], MD;  Location: [place] [facility];  Service: Cardiovascular;  Laterality: N/A;   ABDOMINAL AORTOGRAM W/LOWER EXTREMITY Left 07/18/2019   Procedure: ABDOMINAL AORTOGRAM W/LOWER EXTREMITY;  Surgeon: [name], MD;  Location: [place] [facility];  Service: Cardiovascular;  Laterality: Left;   ABDOMINOPLASTY     ADENOIDECTOMY     APPENDECTOMY  1995   AV FISTULA PLACEMENT Left 02/01/2017   Procedure: ARTERIOVENOUS BRACHIOCEPHALIC (AV) FISTULA CREATION;  Surgeon: [name], MD;  Location: [place] [facility];  Service: Vascular;  Laterality: Left;   [place] INTERVENTION N/A 08/05/2016   Procedure: Coronary Stent Intervention;  Surgeon: [name], MD;  Location: [place] [facility];  Service: Cardiovascular;  Laterality: N/A;   ESOPHAGOGASTRODUODENOSCOPY (EGD) WITH PROPOFOL N/A 07/01/2020   Procedure: ESOPHAGOGASTRODUODENOSCOPY (EGD) WITH PROPOFOL;  Surgeon: Gatha Mayer, MD;  Location: [place] [place];  Service: Endoscopy;  Laterality: N/A;   EXPLORATORY LAPAROTOMY  08/14/2005   lysis of adhesions, drainage  of tubo-ovarian abscess   FISTULA SUPERFICIALIZATION Left 09/14/2017   Procedure: FISTULA SUPERFICIALIZATION ARTERIOVENOUS FISTULA LEFT ARM;  Surgeon: [name], MD;  Location: [place];  Service: Vascular;  Laterality: Left;   FLEXIBLE SIGMOIDOSCOPY N/A 01/04/2019   Procedure: FLEXIBLE SIGMOIDOSCOPY;  Surgeon: Clarene Essex, MD;  Location: [place];  Service: Endoscopy;  Laterality: N/A;   HYDRADENITIS EXCISION  02/08/2011   Procedure: EXCISION HYDRADENITIS GROIN;  Surgeon: [name], MD;  Location: [facility];  Service: General;  Laterality:  N/A;  Excisioin of Hidradenitis Left groin   INCISION AND DRAINAGE ABSCESS N/A 01/11/2019   Procedure: INCISION AND DRAINAGE SACRAL ABSCESS;  Surgeon: Georganna Skeans, MD;  Location: [place];  Service: General;  Laterality: N/A;   INGUINAL HIDRADENITIS EXCISION  07/06/2010   bilateral   INSERTION OF DIALYSIS CATHETER N/A 09/14/2017   Procedure: INSERTION OF TUNNELED DIALYSIS CATHETER Right Internal Jugular;  Surgeon: [name], MD;  Location: [place];  Service: Vascular;  Laterality: N/A;   IR FLUORO GUIDE CV LINE RIGHT  03/01/2019   IR US GUIDE BX ASP/DRAIN  01/01/2019   IR US GUIDE VASC ACCESS RIGHT  03/01/2019   LEFT HEART CATH AND CORONARY ANGIOGRAPHY N/A 08/05/2016   Procedure: Left Heart Cath and Coronary Angiography;  Surgeon: [name], MD;  Location: [place] [facility];  Service: Cardiovascular;  Laterality: N/A;   PERIPHERAL VASCULAR INTERVENTION Left 12/14/2018   Procedure: PERIPHERAL VASCULAR INTERVENTION;  Surgeon: [name], MD;  Location: [place] [facility];  Service: Cardiovascular;  Laterality: Left;  SFA   PERIPHERAL VASCULAR INTERVENTION Left 04/25/2019   Procedure: PERIPHERAL VASCULAR INTERVENTION;  Surgeon: [name], MD;  Location: [place] [facility];  Service: Cardiovascular;  Laterality: Left;  right superficial femoral, and left iliac   PERIPHERAL VASCULAR INTERVENTION Left 07/18/2019   Procedure: PERIPHERAL VASCULAR INTERVENTION;  Surgeon: [name], MD;  Location: [place] [facility];  Service: Cardiovascular;  Laterality: Left;  external iliac   REDUCTION MAMMAPLASTY  2002   TONSILLECTOMY     TUBAL LIGATION  1996   VAGINAL HYSTERECTOMY  08/04/2005   and cysto   Patient Active Problem List   Diagnosis Date Noted   Diabetes mellitus ([place]) 01/29/2021   Hypertension associated with stage 5 chronic kidney disease due to type 2 diabetes mellitus ([place]) 01/29/2021   Tumoral calcinosis 01/29/2021   Dysphagia    Nausea 09/12/2019    Diarrhea, unspecified 09/12/2019   Pruritus, unspecified 08/13/2019   Anaphylactic shock, unspecified, sequela 08/13/2019   Allergy, unspecified, sequela 08/13/2019   Unspecified cataract 03/19/2019   Unspecified asthma with (acute) exacerbation 03/19/2019   COVID-19 03/06/2019   Intractable nausea and vomiting 02/24/2019   Abdominal pain, epigastric 02/24/2019   Multifocal pneumonia 02/24/2019   Shortness of breath 01/19/2019   Cerebrovascular small vessel disease    Generalized weakness    Abscess of sacrum (H[facility])    Gluteal abscess 12/25/2018   Moderate protein-calorie malnutrition ([place]) 12/19/2018   Other disorders resulting from impaired renal tubular function 12/19/2018   Liver disease, unspecified 12/19/2018   Hyperkalemia 12/19/2018   Acidosis 12/19/2018   Hyperlipidemia 11/10/2018   History of arteriosclerotic vascular disease 11/10/2018   Carotid stenosis 11/10/2018   Symptomatic anemia 09/16/2018   Peripheral vascular disease, unspecified ([place]) 09/04/2018   Other disorders of bilirubin metabolism 01/20/2018   Other long term (current) drug therapy 11/28/2017   Other abnormal findings in urine 11/28/2017   Dependence on renal  dialysis ([place]) 11/28/2017   ESRD on peritoneal dialysis ([place]) 51/70/0174   Complication of vascular dialysis catheter 09/15/2017   History of anemia due to CKD 07/18/2017   DVT (deep vein thrombosis) in pregnancy 07/18/2017   Encounter for removal of sutures 07/07/2017   Iron deficiency anemia, unspecified 06/30/2017   Secondary hyperparathyroidism of renal origin ([place]) 06/24/2017   Pain, unspecified 06/24/2017   Fever, unspecified 06/24/2017   End stage renal disease ([place]) 06/24/2017   Coagulation defect, unspecified ([place]) 06/24/2017   Anemia in chronic kidney disease 06/24/2017   Chronic diastolic CHF (congestive heart failure) ([place]) 06/18/2017   Moderate persistent asthma 06/10/2017   Adjustment disorder with depressed mood 09/08/2016    CKD (chronic kidney disease) stage 4, GFR 15-29 ml/min (H[facility])    CAD S/P mLAD PCI with DES 08/06/2016   Presence of drug coated stent in LAD coronary artery 08/06/2016   Atherosclerotic heart disease of native coronary artery without angina pectoris 07/11/2016   Carotid bruit present 06/15/2016   Dyslipidemia 06/15/2016   Smoker 06/15/2016   Neuropathy 03/10/2016   Essential hypertension, benign 12/11/2015   Left facial pain 03/04/2015   Abnormal sense of taste 03/04/2015   Gastroparesis    Uncontrolled type 2 diabetes mellitus with peripheral neuropathy 06/05/2014   Yeast infection of the vagina 07/10/2010    REFERRING DIAG: E83.59 (ICD-10-CM) - Tumoral calcinosis G89.29 (ICD-10-CM) - Other chronic pain     THERAPY DIAG:  Muscle weakness (generalized)  Unsteadiness on feet  Rationale for Evaluation and Treatment Rehabilitation  PERTINENT HISTORY: multiple comorbidity, see PMH  PRECAUTIONS: Fall  SUBJECTIVE: No changes, area is getting worse and always painful at L hip.  Had a rough treatment with dialysis yesterday.  PAIN:  Are you having pain? Yes: NPRS scale: 10/10 Pain location: L hip Pain description: always painful Aggravating factors: all movements Relieving factors: ice, heating pads   OBJECTIVE:     TODAY'S TREATMENT: 05/23/2021 Activity Comments  Quad sets 10 reps Performed in reclined sitting position  Glut sets 2 x 5 reps Performed in sitting  Hip adductor squeezes x 10 reps Performed in sitting  L hip flexor stretch x 2, 30 sec Performed in reclined sit  Hooklying trunk rotation towards R, 3 reps   STanding wide BOS lateral weightshifting 10 reps Educated pt can use this as stretch or as rocking motion for relaxed motion  Standing wide BOS, anterior/posterior weightshifting 10 reps  Educated pt can use as stretch or rocking motion      Access Code: [email or web address] URL: [email or web address] Date: 06/02/2021 Prepared by: [facility]  Exercises - Supine Quadricep Sets  - 1 x daily - 3-4 x weekly - 2-3 sets - 10 reps - 3 sec hold - Supine Gluteal Sets  - 1 x daily - 3-4 x weekly - 2 sets - 10 reps - 3 sec hold - Hip Flexor/Ankle stretching  - 1-2 x daily - 7 x weekly - 1 sets - 3 reps - 30-60 sec hold - Side to side weightshift  - 1-2 x daily - 7 x weekly - 1-2 sets - 10 reps - Standing Lumbar Spine Flexion Stretch Counter  - 1-2 x daily - 7 x weekly - 1-2 sets - 10 reps  PATIENT EDUCATION: Education details: Educated in HEP additions; also educated in use of ice massage for pain control/how pt can perform at home Person educated: Patient Education method: Explanation Education comprehension: verbalized understanding  GOALS: Goals reviewed with patient? Yes   SHORT TERM GOALS: Target date: 06/09/2021      Patient will be independent in HEP to improve functional outcomes Baseline: Goal status: INITIAL       LONG TERM GOALS: Target date: 07/07/2021    Demo reduced risk for falls per score 45/56 Berg Balance Baseline: 40/56 Goal status: INITIAL   2.  Demo reduced risk for falls per score 19/24 DGI Baseline: 12/24 Goal status: INITIAL   3.  Reduce risk for falls per time 15 sec TUG test Baseline: 20.80 sec with cane Goal status: INITIAL   4.  Improve left quad strength to 4/5 to improve stance stability Baseline: 3+/5 Goal status: INITIAL     ASSESSMENT:   CLINICAL IMPRESSION: Updated HEP today for strengthening, stretching to address lower extremity pain, decreased strength.  With gentle stretching pt notes temporary improvement of pain, but pt conitnues to rate hip pain as 10/10.  Patient will continue to benefit from PT services to address deficits/limitations to perform mobility with reduce risk for falls and enable greater activity tolerance to facilitate community excursions and activity participation      OBJECTIVE IMPAIRMENTS Abnormal gait, decreased activity  tolerance, decreased balance, decreased endurance, decreased mobility, difficulty walking, decreased ROM, decreased strength, impaired perceived functional ability, and pain.    ACTIVITY LIMITATIONS cleaning, driving, meal prep, laundry, and shopping.    PERSONAL FACTORS Past/current experiences, Time since onset of injury/illness/exacerbation, and 3+ comorbidities: multiple (see PMH)  are also affecting patient's functional outcome.      REHAB POTENTIAL: Fair due to degree of deficits and interaction of conditions   CLINICAL DECISION MAKING: Evolving/moderate complexity   EVALUATION COMPLEXITY: Moderate   PLAN: PT FREQUENCY: 1x/week   PT DURATION: 4 weeks   PLANNED INTERVENTIONS: Therapeutic exercises, Therapeutic activity, Neuromuscular re-education, Balance training, Gait training, Patient/Family education, Joint mobilization, Stair training, Orthotic/Fit training, DME instructions, Dry Needling, Electrical stimulation, Wheelchair mobility training, Cryotherapy, Moist heat, Taping, Ultrasound, Ionotophoresis '4mg'$ /ml Dexamethasone, and Manual therapy   PLAN FOR NEXT SESSION: Continue with LE isometric strengthening to perform at dialysis; work towards exercises to improve balance scores for LTGs (need to check scheduled appts-more than the 4 that are noted in end of session)    Zabrina Brotherton W., PT 06/02/2021, 3:23 PM

## 2021-06-03 DIAGNOSIS — Z992 Dependence on renal dialysis: Secondary | ICD-10-CM | POA: Diagnosis not present

## 2021-06-03 DIAGNOSIS — N186 End stage renal disease: Secondary | ICD-10-CM | POA: Diagnosis not present

## 2021-06-03 DIAGNOSIS — D509 Iron deficiency anemia, unspecified: Secondary | ICD-10-CM | POA: Diagnosis not present

## 2021-06-03 DIAGNOSIS — D631 Anemia in chronic kidney disease: Secondary | ICD-10-CM | POA: Diagnosis not present

## 2021-06-03 DIAGNOSIS — N2581 Secondary hyperparathyroidism of renal origin: Secondary | ICD-10-CM | POA: Diagnosis not present

## 2021-06-03 DIAGNOSIS — L299 Pruritus, unspecified: Secondary | ICD-10-CM | POA: Diagnosis not present

## 2021-06-04 ENCOUNTER — Inpatient Hospital Stay: Payer: Medicare Other | Admitting: Physician Assistant

## 2021-06-05 DIAGNOSIS — D509 Iron deficiency anemia, unspecified: Secondary | ICD-10-CM | POA: Diagnosis not present

## 2021-06-05 DIAGNOSIS — Z992 Dependence on renal dialysis: Secondary | ICD-10-CM | POA: Diagnosis not present

## 2021-06-05 DIAGNOSIS — D631 Anemia in chronic kidney disease: Secondary | ICD-10-CM | POA: Diagnosis not present

## 2021-06-05 DIAGNOSIS — N186 End stage renal disease: Secondary | ICD-10-CM | POA: Diagnosis not present

## 2021-06-05 DIAGNOSIS — N2581 Secondary hyperparathyroidism of renal origin: Secondary | ICD-10-CM | POA: Diagnosis not present

## 2021-06-05 DIAGNOSIS — L299 Pruritus, unspecified: Secondary | ICD-10-CM | POA: Diagnosis not present

## 2021-06-08 DIAGNOSIS — N2581 Secondary hyperparathyroidism of renal origin: Secondary | ICD-10-CM | POA: Diagnosis not present

## 2021-06-08 DIAGNOSIS — N186 End stage renal disease: Secondary | ICD-10-CM | POA: Diagnosis not present

## 2021-06-08 DIAGNOSIS — D631 Anemia in chronic kidney disease: Secondary | ICD-10-CM | POA: Diagnosis not present

## 2021-06-08 DIAGNOSIS — D509 Iron deficiency anemia, unspecified: Secondary | ICD-10-CM | POA: Diagnosis not present

## 2021-06-08 DIAGNOSIS — Z992 Dependence on renal dialysis: Secondary | ICD-10-CM | POA: Diagnosis not present

## 2021-06-08 DIAGNOSIS — L299 Pruritus, unspecified: Secondary | ICD-10-CM | POA: Diagnosis not present

## 2021-06-09 ENCOUNTER — Ambulatory Visit: Payer: Medicare Other | Admitting: Family Medicine

## 2021-06-10 DIAGNOSIS — D509 Iron deficiency anemia, unspecified: Secondary | ICD-10-CM | POA: Diagnosis not present

## 2021-06-10 DIAGNOSIS — N186 End stage renal disease: Secondary | ICD-10-CM | POA: Diagnosis not present

## 2021-06-10 DIAGNOSIS — Z992 Dependence on renal dialysis: Secondary | ICD-10-CM | POA: Diagnosis not present

## 2021-06-10 DIAGNOSIS — L299 Pruritus, unspecified: Secondary | ICD-10-CM | POA: Diagnosis not present

## 2021-06-10 DIAGNOSIS — N2581 Secondary hyperparathyroidism of renal origin: Secondary | ICD-10-CM | POA: Diagnosis not present

## 2021-06-10 DIAGNOSIS — D631 Anemia in chronic kidney disease: Secondary | ICD-10-CM | POA: Diagnosis not present

## 2021-06-11 ENCOUNTER — Ambulatory Visit: Payer: Medicare Other | Attending: Orthopedic Surgery

## 2021-06-11 DIAGNOSIS — D631 Anemia in chronic kidney disease: Secondary | ICD-10-CM | POA: Diagnosis not present

## 2021-06-11 DIAGNOSIS — R2681 Unsteadiness on feet: Secondary | ICD-10-CM | POA: Insufficient documentation

## 2021-06-11 DIAGNOSIS — D649 Anemia, unspecified: Secondary | ICD-10-CM | POA: Diagnosis not present

## 2021-06-11 DIAGNOSIS — N186 End stage renal disease: Secondary | ICD-10-CM | POA: Diagnosis not present

## 2021-06-11 DIAGNOSIS — L299 Pruritus, unspecified: Secondary | ICD-10-CM | POA: Diagnosis not present

## 2021-06-11 DIAGNOSIS — R262 Difficulty in walking, not elsewhere classified: Secondary | ICD-10-CM | POA: Diagnosis not present

## 2021-06-11 DIAGNOSIS — E1122 Type 2 diabetes mellitus with diabetic chronic kidney disease: Secondary | ICD-10-CM | POA: Diagnosis not present

## 2021-06-11 DIAGNOSIS — N2581 Secondary hyperparathyroidism of renal origin: Secondary | ICD-10-CM | POA: Diagnosis not present

## 2021-06-11 DIAGNOSIS — D509 Iron deficiency anemia, unspecified: Secondary | ICD-10-CM | POA: Diagnosis not present

## 2021-06-11 DIAGNOSIS — M6281 Muscle weakness (generalized): Secondary | ICD-10-CM | POA: Diagnosis not present

## 2021-06-11 DIAGNOSIS — Z992 Dependence on renal dialysis: Secondary | ICD-10-CM | POA: Diagnosis not present

## 2021-06-11 NOTE — Therapy (Signed)
OUTPATIENT PHYSICAL THERAPY TREATMENT NOTE   Patient Name: Susan Horton MRN: [id number] DOB:11-02-1970, 51 y.o., female Today's Date: 06/11/2021  PCP: Charlott Rakes, MD REFERRING PROVIDER: Newt Minion., MD    END OF SESSION:   PT End of Session - 06/11/21 1154     Visit Number 3    Number of Visits 4    Date for PT Re-Evaluation 07/07/21    Authorization Type Medicare A&B/ BCBS supplemental 2023    Progress Note Due on Visit 10    PT Start Time 1150    PT Stop Time 6606    PT Time Calculation (min) 45 min    Equipment Utilized During Treatment Gait belt    Activity Tolerance Patient tolerated treatment well;Patient limited by fatigue;Patient limited by pain   continues to report 10/10 pain throughout session   Behavior During Therapy [facility] for tasks assessed/performed             Past Medical History:  Diagnosis Date   Abnormal stress test    Anemia    Angio-edema    Asthma    CAD (coronary artery disease)    DES to mid LAD July 2018, residual moderate RCA disease   Cataract    CKD (chronic kidney disease) stage 4, GFR 15-29 ml/min ([place])    Dialysis T/Th/Sa   DVT (deep venous thrombosis) ([place])    1996 during pregnancy, 2015 left leg   Eczema    Essential hypertension 12/11/2015   Gastroparesis    GERD (gastroesophageal reflux disease)    Gluteal abscess 12/27/2018   Hidradenitis    Migraine    Neuropathy    Peripheral vascular disease ([place])    blood clot in leg   Progressive angina ([place]) 06/05/2014   Chest pain   Type 2 diabetes mellitus ([place])    Type II diabetes mellitus ([place])    Urticaria    Past Surgical History:  Procedure Laterality Date   A/V FISTULAGRAM N/A 06/22/2017   Procedure: A/V FISTULAGRAM - left arm;  Surgeon: [name], MD;  Location: [place] [facility];  Service: Cardiovascular;  Laterality: N/A;   ABDOMINAL AORTOGRAM W/LOWER EXTREMITY Bilateral 12/14/2018   Procedure: ABDOMINAL AORTOGRAM W/LOWER EXTREMITY;  Surgeon:  [name], MD;  Location: [place] [facility];  Service: Cardiovascular;  Laterality: Bilateral;   ABDOMINAL AORTOGRAM W/LOWER EXTREMITY N/A 04/25/2019   Procedure: ABDOMINAL AORTOGRAM W/LOWER EXTREMITY;  Surgeon: [name], MD;  Location: [place] [facility];  Service: Cardiovascular;  Laterality: N/A;   ABDOMINAL AORTOGRAM W/LOWER EXTREMITY Left 07/18/2019   Procedure: ABDOMINAL AORTOGRAM W/LOWER EXTREMITY;  Surgeon: [name], MD;  Location: [place] [facility];  Service: Cardiovascular;  Laterality: Left;   ABDOMINOPLASTY     ADENOIDECTOMY     APPENDECTOMY  1995   AV FISTULA PLACEMENT Left 02/01/2017   Procedure: ARTERIOVENOUS BRACHIOCEPHALIC (AV) FISTULA CREATION;  Surgeon: [name], MD;  Location: [place] OR;  Service: Vascular;  Laterality: Left;   [place] INTERVENTION N/A 08/05/2016   Procedure: Coronary Stent Intervention;  Surgeon: [name], MD;  Location: [place] [facility];  Service: Cardiovascular;  Laterality: N/A;   ESOPHAGOGASTRODUODENOSCOPY (EGD) WITH PROPOFOL N/A 07/01/2020   Procedure: ESOPHAGOGASTRODUODENOSCOPY (EGD) WITH PROPOFOL;  Surgeon: Gatha Mayer, MD;  Location: [place];  Service: Endoscopy;  Laterality: N/A;   EXPLORATORY LAPAROTOMY  08/14/2005   lysis of adhesions, drainage  of tubo-ovarian abscess   FISTULA SUPERFICIALIZATION Left 09/14/2017   Procedure: FISTULA SUPERFICIALIZATION ARTERIOVENOUS FISTULA LEFT ARM;  Surgeon: [name], MD;  Location: [place];  Service: Vascular;  Laterality: Left;   FLEXIBLE SIGMOIDOSCOPY N/A 01/04/2019   Procedure: FLEXIBLE SIGMOIDOSCOPY;  Surgeon: Clarene Essex, MD;  Location: [place];  Service: Endoscopy;  Laterality: N/A;   HYDRADENITIS EXCISION  02/08/2011   Procedure: EXCISION HYDRADENITIS GROIN;  Surgeon: [name], MD;  Location: [place];  Service: General;  Laterality:  N/A;  Excisioin of Hidradenitis Left groin   INCISION AND DRAINAGE ABSCESS N/A 01/11/2019   Procedure: INCISION AND DRAINAGE SACRAL ABSCESS;  Surgeon: Georganna Skeans, MD;  Location: [place];  Service: General;  Laterality: N/A;   INGUINAL HIDRADENITIS EXCISION  07/06/2010   bilateral   INSERTION OF DIALYSIS CATHETER N/A 09/14/2017   Procedure: INSERTION OF TUNNELED DIALYSIS CATHETER Right Internal Jugular;  Surgeon: [name], MD;  Location: [place];  Service: Vascular;  Laterality: N/A;   IR FLUORO GUIDE CV LINE RIGHT  03/01/2019   IR US GUIDE BX ASP/DRAIN  01/01/2019   IR US GUIDE VASC ACCESS RIGHT  03/01/2019   LEFT HEART CATH AND CORONARY ANGIOGRAPHY N/A 08/05/2016   Procedure: Left Heart Cath and Coronary Angiography;  Surgeon: [name], MD;  Location: [place] [facility];  Service: Cardiovascular;  Laterality: N/A;   PERIPHERAL VASCULAR INTERVENTION Left 12/14/2018   Procedure: PERIPHERAL VASCULAR INTERVENTION;  Surgeon: [name], MD;  Location: [place] [facility];  Service: Cardiovascular;  Laterality: Left;  SFA   PERIPHERAL VASCULAR INTERVENTION Left 04/25/2019   Procedure: PERIPHERAL VASCULAR INTERVENTION;  Surgeon: [name], MD;  Location: [place] [facility];  Service: Cardiovascular;  Laterality: Left;  right superficial femoral, and left iliac   PERIPHERAL VASCULAR INTERVENTION Left 07/18/2019   Procedure: PERIPHERAL VASCULAR INTERVENTION;  Surgeon: [name], MD;  Location: [place] [facility];  Service: Cardiovascular;  Laterality: Left;  external iliac   REDUCTION MAMMAPLASTY  2002   TONSILLECTOMY     TUBAL LIGATION  1996   VAGINAL HYSTERECTOMY  08/04/2005   and cysto   Patient Active Problem List   Diagnosis Date Noted   Diabetes mellitus ([place]) 01/29/2021   Hypertension associated with stage 5 chronic kidney disease due to type 2 diabetes mellitus ([place]) 01/29/2021   Tumoral calcinosis 01/29/2021   Dysphagia    Nausea 09/12/2019    Diarrhea, unspecified 09/12/2019   Pruritus, unspecified 08/13/2019   Anaphylactic shock, unspecified, sequela 08/13/2019   Allergy, unspecified, sequela 08/13/2019   Unspecified cataract 03/19/2019   Unspecified asthma with (acute) exacerbation 03/19/2019   COVID-19 03/06/2019   Intractable nausea and vomiting 02/24/2019   Abdominal pain, epigastric 02/24/2019   Multifocal pneumonia 02/24/2019   Shortness of breath 01/19/2019   Cerebrovascular small vessel disease    Generalized weakness    Abscess of sacrum (HCC)    Gluteal abscess 12/25/2018   Moderate protein-calorie malnutrition ([place]) 12/19/2018   Other disorders resulting from impaired renal tubular function 12/19/2018   Liver disease, unspecified 12/19/2018   Hyperkalemia 12/19/2018   Acidosis 12/19/2018   Hyperlipidemia 11/10/2018   History of arteriosclerotic vascular disease 11/10/2018   Carotid stenosis 11/10/2018   Symptomatic anemia 09/16/2018   Peripheral vascular disease, unspecified ([place]) 09/04/2018   Other disorders of bilirubin metabolism 01/20/2018   Other long term (current) drug therapy 11/28/2017   Other abnormal findings in urine 11/28/2017   Dependence on renal  dialysis ([place]) 11/28/2017   ESRD on peritoneal dialysis ([place]) 07/68/0881   Complication of vascular dialysis catheter 09/15/2017   History of anemia due to CKD 07/18/2017   DVT (deep vein thrombosis) in pregnancy 07/18/2017   Encounter for removal of sutures 07/07/2017   Iron deficiency anemia, unspecified 06/30/2017   Secondary hyperparathyroidism of renal origin ([place]) 06/24/2017   Pain, unspecified 06/24/2017   Fever, unspecified 06/24/2017   End stage renal disease ([place]) 06/24/2017   Coagulation defect, unspecified ([place]) 06/24/2017   Anemia in chronic kidney disease 06/24/2017   Chronic diastolic CHF (congestive heart failure) ([place]) 06/18/2017   Moderate persistent asthma 06/10/2017   Adjustment disorder with depressed mood 09/08/2016    CKD (chronic kidney disease) stage 4, GFR 15-29 ml/min (HCC)    CAD S/P mLAD PCI with DES 08/06/2016   Presence of drug coated stent in LAD coronary artery 08/06/2016   Atherosclerotic heart disease of native coronary artery without angina pectoris 07/11/2016   Carotid bruit present 06/15/2016   Dyslipidemia 06/15/2016   Smoker 06/15/2016   Neuropathy 03/10/2016   Essential hypertension, benign 12/11/2015   Left facial pain 03/04/2015   Abnormal sense of taste 03/04/2015   Gastroparesis    Uncontrolled type 2 diabetes mellitus with peripheral neuropathy 06/05/2014   Yeast infection of the vagina 07/10/2010    REFERRING DIAG: E83.59 (ICD-10-CM) - Tumoral calcinosis G89.29 (ICD-10-CM) - Other chronic pain     THERAPY DIAG:  Muscle weakness (generalized)  Unsteadiness on feet  Difficulty in walking, not elsewhere classified  Rationale for Evaluation and Treatment Rehabilitation  PERTINENT HISTORY: multiple comorbidity, see PMH  PRECAUTIONS: Fall  SUBJECTIVE: Symptoms remain the same. Left Leg goes numb frequently  PAIN:  Are you having pain? Yes: NPRS scale: 10/10 Pain location: L hip Pain description: always painful Aggravating factors: all movements Relieving factors: ice, heating pads   OBJECTIVE:     TODAY'S TREATMENT: 06/11/2021 Activity Comments  Ankle pumps 30x sitting  Quad sets 20 reps Performed in reclined sitting position  Glut sets 2 x 10 reps Performed in sitting  Hip adductor squeezes 2 x 10 reps Performed in sitting   Performed in reclined sit  Hooklying trunk rotation towards R, 3 reps   STanding wide BOS lateral weightshifting 2x10 reps, lateral step with foot lift for SLS Larger amplitude for SLS  Tandem stance at sink 3x10 sec ea. Standing in corner: feet together/eyes closed 3x10 sec   NU-step level 5 x 5 min 30-50 SPM For CV endurance, cues in pacing/pursed lip breathing   Access Code: [email or web address] URL: [email or web address] Date:  06/02/2021 Prepared by: [facility]  Exercises - Supine Quadricep Sets  - 1 x daily - 3-4 x weekly - 2-3 sets - 10 reps - 3 sec hold - Supine Gluteal Sets  - 1 x daily - 3-4 x weekly - 2 sets - 10 reps - 3 sec hold - Hip Flexor/Ankle stretching  - 1-2 x daily - 7 x weekly - 1 sets - 3 reps - 30-60 sec hold - Side to side weightshift  - 1-2 x daily - 7 x weekly - 1-2 sets - 10 reps - Standing Lumbar Spine Flexion Stretch Counter  - 1-2 x daily - 7 x weekly - 1-2 sets - 10 reps -Tandem stance 3x10 sec -Standing in corner, feet together eyes closed 3x10 sec  PATIENT EDUCATION: Education details: Educated in HEP additions; also educated in use of ice massage for pain  control/how pt can perform at home Person educated: Patient Education method: Explanation Education comprehension: verbalized understanding   GOALS: Goals reviewed with patient? Yes   SHORT TERM GOALS: Target date: 06/09/2021      Patient will be independent in HEP to improve functional outcomes Baseline: Goal status: INITIAL       LONG TERM GOALS: Target date: 07/07/2021    Demo reduced risk for falls per score 45/56 Berg Balance Baseline: 40/56 Goal status: INITIAL   2.  Demo reduced risk for falls per score 19/24 DGI Baseline: 12/24 Goal status: INITIAL   3.  Reduce risk for falls per time 15 sec TUG test Baseline: 20.80 sec with cane Goal status: INITIAL   4.  Improve left quad strength to 4/5 to improve stance stability Baseline: 3+/5 Goal status: INITIAL     ASSESSMENT:   CLINICAL IMPRESSION: Addition of static balance activities to HEP to improve balance, proprioception to reduce risk for falls and improve awareness for limits of stability. Demo good HEP recall via return demonstration of strength activities that are aimed at performing during dialysis sessions.  Cardiovascular training via Nu-step introduced at end of session. Pt educated on access to [employer] or gym membership via her health insurance providers to improve carryover of activities at D/C from therapy. Continued sessions to progress HEP for self-directed program. Tolerated tx session well despite high pain rating in her hips with pt reporting ability to progress through with activity     OBJECTIVE IMPAIRMENTS Abnormal gait, decreased activity tolerance, decreased balance, decreased endurance, decreased mobility, difficulty walking, decreased ROM, decreased strength, impaired perceived functional ability, and pain.    ACTIVITY LIMITATIONS cleaning, driving, meal prep, laundry, and shopping.    PERSONAL FACTORS Past/current experiences, Time since onset of injury/illness/exacerbation, and 3+ comorbidities: multiple (see PMH)  are also affecting patient's functional outcome.      REHAB POTENTIAL: Fair due to degree of deficits and interaction of conditions   CLINICAL DECISION MAKING: Evolving/moderate complexity   EVALUATION COMPLEXITY: Moderate   PLAN: PT FREQUENCY: 1x/week   PT DURATION: 4 weeks   PLANNED INTERVENTIONS: Therapeutic exercises, Therapeutic activity, Neuromuscular re-education, Balance training, Gait training, Patient/Family education, Joint mobilization, Stair training, Orthotic/Fit training, DME instructions, Dry Needling, Electrical stimulation, Wheelchair mobility training, Cryotherapy, Moist heat, Taping, Ultrasound, Ionotophoresis '4mg'$ /ml Dexamethasone, and Manual therapy   PLAN FOR NEXT SESSION: Continue with LE isometric strengthening to perform at dialysis; work towards exercises to improve balance scores for LTGs (need to check scheduled appts-more than the 4 that are noted in end of session)    Toniann Fail, PT 06/11/2021, 11:55 AM

## 2021-06-12 DIAGNOSIS — D631 Anemia in chronic kidney disease: Secondary | ICD-10-CM | POA: Diagnosis not present

## 2021-06-12 DIAGNOSIS — L299 Pruritus, unspecified: Secondary | ICD-10-CM | POA: Diagnosis not present

## 2021-06-12 DIAGNOSIS — D509 Iron deficiency anemia, unspecified: Secondary | ICD-10-CM | POA: Diagnosis not present

## 2021-06-12 DIAGNOSIS — N186 End stage renal disease: Secondary | ICD-10-CM | POA: Diagnosis not present

## 2021-06-12 DIAGNOSIS — D649 Anemia, unspecified: Secondary | ICD-10-CM | POA: Diagnosis not present

## 2021-06-12 DIAGNOSIS — Z992 Dependence on renal dialysis: Secondary | ICD-10-CM | POA: Diagnosis not present

## 2021-06-12 DIAGNOSIS — N2581 Secondary hyperparathyroidism of renal origin: Secondary | ICD-10-CM | POA: Diagnosis not present

## 2021-06-15 DIAGNOSIS — N186 End stage renal disease: Secondary | ICD-10-CM | POA: Diagnosis not present

## 2021-06-15 DIAGNOSIS — N2581 Secondary hyperparathyroidism of renal origin: Secondary | ICD-10-CM | POA: Diagnosis not present

## 2021-06-15 DIAGNOSIS — Z992 Dependence on renal dialysis: Secondary | ICD-10-CM | POA: Diagnosis not present

## 2021-06-15 DIAGNOSIS — D509 Iron deficiency anemia, unspecified: Secondary | ICD-10-CM | POA: Diagnosis not present

## 2021-06-15 DIAGNOSIS — L299 Pruritus, unspecified: Secondary | ICD-10-CM | POA: Diagnosis not present

## 2021-06-15 DIAGNOSIS — D631 Anemia in chronic kidney disease: Secondary | ICD-10-CM | POA: Diagnosis not present

## 2021-06-16 ENCOUNTER — Ambulatory Visit: Payer: Medicare Other

## 2021-06-16 NOTE — Patient Outreach (Signed)
[place] [facility]) Care Management  06/16/2021  Susan Horton 03-04-1970 [id number]   Received referral for Care Management from Insurance plan. Assigned patient to Valente David, RN Care Coordinator for follow up.  [place] Management Assistant [phone]

## 2021-06-17 DIAGNOSIS — N2581 Secondary hyperparathyroidism of renal origin: Secondary | ICD-10-CM | POA: Diagnosis not present

## 2021-06-17 DIAGNOSIS — D631 Anemia in chronic kidney disease: Secondary | ICD-10-CM | POA: Diagnosis not present

## 2021-06-17 DIAGNOSIS — Z992 Dependence on renal dialysis: Secondary | ICD-10-CM | POA: Diagnosis not present

## 2021-06-17 DIAGNOSIS — L299 Pruritus, unspecified: Secondary | ICD-10-CM | POA: Diagnosis not present

## 2021-06-17 DIAGNOSIS — D509 Iron deficiency anemia, unspecified: Secondary | ICD-10-CM | POA: Diagnosis not present

## 2021-06-17 DIAGNOSIS — N186 End stage renal disease: Secondary | ICD-10-CM | POA: Diagnosis not present

## 2021-06-18 ENCOUNTER — Other Ambulatory Visit: Payer: Self-pay | Admitting: *Deleted

## 2021-06-18 NOTE — Patient Outreach (Signed)
[place] [facility]) Care Management  06/18/2021  LATARSHA ZANI 27-Jul-1970 [id number]   Referral Date: 6/6  Referral Source: Insurance  Referral Reason: Chronic care management Insurance: [place] attempt #1, successful.  Identity verified.  This care manager introduced self and stated purpose of call.  [facility] care management services explained.  Member declines participation.  Benefits explained, she declines.  Encouraged to receive letter and brochure about program in the mail in effort to review and consider, she declines.   Plan: RN CM will notify PCP that member declined to participate, will close case at this time.  Valente David, RN, MSN, [place] Manager [phone]

## 2021-06-19 DIAGNOSIS — N186 End stage renal disease: Secondary | ICD-10-CM | POA: Diagnosis not present

## 2021-06-19 DIAGNOSIS — Z992 Dependence on renal dialysis: Secondary | ICD-10-CM | POA: Diagnosis not present

## 2021-06-19 DIAGNOSIS — N2581 Secondary hyperparathyroidism of renal origin: Secondary | ICD-10-CM | POA: Diagnosis not present

## 2021-06-19 DIAGNOSIS — L299 Pruritus, unspecified: Secondary | ICD-10-CM | POA: Diagnosis not present

## 2021-06-19 DIAGNOSIS — D631 Anemia in chronic kidney disease: Secondary | ICD-10-CM | POA: Diagnosis not present

## 2021-06-19 DIAGNOSIS — D509 Iron deficiency anemia, unspecified: Secondary | ICD-10-CM | POA: Diagnosis not present

## 2021-06-22 DIAGNOSIS — Z992 Dependence on renal dialysis: Secondary | ICD-10-CM | POA: Diagnosis not present

## 2021-06-22 DIAGNOSIS — D509 Iron deficiency anemia, unspecified: Secondary | ICD-10-CM | POA: Diagnosis not present

## 2021-06-22 DIAGNOSIS — N186 End stage renal disease: Secondary | ICD-10-CM | POA: Diagnosis not present

## 2021-06-22 DIAGNOSIS — N2581 Secondary hyperparathyroidism of renal origin: Secondary | ICD-10-CM | POA: Diagnosis not present

## 2021-06-22 DIAGNOSIS — D631 Anemia in chronic kidney disease: Secondary | ICD-10-CM | POA: Diagnosis not present

## 2021-06-22 DIAGNOSIS — L299 Pruritus, unspecified: Secondary | ICD-10-CM | POA: Diagnosis not present

## 2021-06-23 ENCOUNTER — Ambulatory Visit: Payer: Medicare Other

## 2021-06-23 DIAGNOSIS — R2681 Unsteadiness on feet: Secondary | ICD-10-CM

## 2021-06-23 DIAGNOSIS — M6281 Muscle weakness (generalized): Secondary | ICD-10-CM

## 2021-06-23 DIAGNOSIS — R262 Difficulty in walking, not elsewhere classified: Secondary | ICD-10-CM

## 2021-06-23 NOTE — Therapy (Addendum)
OUTPATIENT PHYSICAL THERAPY TREATMENT NOTE and D/C Summary   Patient Name: Susan Horton MRN: [id number] DOB:Jul 06, 1970, 51 y.o., female Today's Date: 06/23/2021  PCP: Charlott Rakes, MD REFERRING PROVIDER: Newt Minion., MD   PHYSICAL THERAPY DISCHARGE SUMMARY  Visits from Start of Care: 4  Current functional level related to goals / functional outcomes: See below   Remaining deficits: LE pain, decreased strength   Education / Equipment: HEP   Patient agrees to discharge. Patient goals were partially met. Patient is being discharged due to being pleased with the current functional level.  END OF SESSION:   PT End of Session - 06/23/21 1104     Visit Number 4    Number of Visits 4    Date for PT Re-Evaluation 07/07/21    Authorization Type Medicare A&B/ BCBS supplemental 2023    Progress Note Due on Visit 10    PT Start Time 1100    PT Stop Time 1145    PT Time Calculation (min) 45 min    Equipment Utilized During Treatment Gait belt    Activity Tolerance Patient tolerated treatment well;Patient limited by fatigue;Patient limited by pain   continues to report 10/10 pain throughout session   Behavior During Therapy [facility] for tasks assessed/performed             Past Medical History:  Diagnosis Date   Abnormal stress test    Anemia    Angio-edema    Asthma    CAD (coronary artery disease)    DES to mid LAD July 2018, residual moderate RCA disease   Cataract    CKD (chronic kidney disease) stage 4, GFR 15-29 ml/min (HCC)    Dialysis T/Th/Sa   DVT (deep venous thrombosis) ([place])    1996 during pregnancy, 2015 left leg   Eczema    Essential hypertension 12/11/2015   Gastroparesis    GERD (gastroesophageal reflux disease)    Gluteal abscess 12/27/2018   Hidradenitis    Migraine    Neuropathy    Peripheral vascular disease (HCC)    blood clot in leg   Progressive angina ([place]) 06/05/2014   Chest pain   Type 2 diabetes mellitus ([place])    Type II diabetes  mellitus ([place])    Urticaria    Past Surgical History:  Procedure Laterality Date   A/V FISTULAGRAM N/A 06/22/2017   Procedure: A/V FISTULAGRAM - left arm;  Surgeon: [name], MD;  Location: [place] [facility];  Service: Cardiovascular;  Laterality: N/A;   ABDOMINAL AORTOGRAM W/LOWER EXTREMITY Bilateral 12/14/2018   Procedure: ABDOMINAL AORTOGRAM W/LOWER EXTREMITY;  Surgeon: [name], MD;  Location: [place] [facility];  Service: Cardiovascular;  Laterality: Bilateral;   ABDOMINAL AORTOGRAM W/LOWER EXTREMITY N/A 04/25/2019   Procedure: ABDOMINAL AORTOGRAM W/LOWER EXTREMITY;  Surgeon: [name], MD;  Location: [place] [facility];  Service: Cardiovascular;  Laterality: N/A;   ABDOMINAL AORTOGRAM W/LOWER EXTREMITY Left 07/18/2019   Procedure: ABDOMINAL AORTOGRAM W/LOWER EXTREMITY;  Surgeon: [name], MD;  Location: [place] [facility];  Service: Cardiovascular;  Laterality: Left;   ABDOMINOPLASTY     ADENOIDECTOMY     APPENDECTOMY  1995   AV FISTULA PLACEMENT Left 02/01/2017   Procedure: ARTERIOVENOUS BRACHIOCEPHALIC (AV) FISTULA CREATION;  Surgeon: [name], MD;  Location: [place];  Service: Vascular;  Laterality: Left;   [place]     CORONARY STENT INTERVENTION N/A 08/05/2016   Procedure:  Coronary Stent Intervention;  Surgeon: [name], MD;  Location: [place] [facility];  Service: Cardiovascular;  Laterality: N/A;   ESOPHAGOGASTRODUODENOSCOPY (EGD) WITH PROPOFOL N/A 07/01/2020   Procedure: ESOPHAGOGASTRODUODENOSCOPY (EGD) WITH PROPOFOL;  Surgeon: Gatha Mayer, MD;  Location: [place];  Service: Endoscopy;  Laterality: N/A;   EXPLORATORY LAPAROTOMY  08/14/2005   lysis of adhesions, drainage of tubo-ovarian abscess   FISTULA SUPERFICIALIZATION Left 09/14/2017   Procedure: FISTULA SUPERFICIALIZATION ARTERIOVENOUS FISTULA LEFT ARM;  Surgeon: [name], MD;  Location:  [place];  Service: Vascular;  Laterality: Left;   FLEXIBLE SIGMOIDOSCOPY N/A 01/04/2019   Procedure: FLEXIBLE SIGMOIDOSCOPY;  Surgeon: Clarene Essex, MD;  Location: [place];  Service: Endoscopy;  Laterality: N/A;   HYDRADENITIS EXCISION  02/08/2011   Procedure: EXCISION HYDRADENITIS GROIN;  Surgeon: [name], MD;  Location: [place];  Service: General;  Laterality: N/A;  Excisioin of Hidradenitis Left groin   INCISION AND DRAINAGE ABSCESS N/A 01/11/2019   Procedure: INCISION AND DRAINAGE SACRAL ABSCESS;  Surgeon: Georganna Skeans, MD;  Location: [place];  Service: General;  Laterality: N/A;   INGUINAL HIDRADENITIS EXCISION  07/06/2010   bilateral   INSERTION OF DIALYSIS CATHETER N/A 09/14/2017   Procedure: INSERTION OF TUNNELED DIALYSIS CATHETER Right Internal Jugular;  Surgeon: [name], MD;  Location: [place];  Service: Vascular;  Laterality: N/A;   IR FLUORO GUIDE CV LINE RIGHT  03/01/2019   IR US GUIDE BX ASP/DRAIN  01/01/2019   IR US GUIDE VASC ACCESS RIGHT  03/01/2019   LEFT HEART CATH AND CORONARY ANGIOGRAPHY N/A 08/05/2016   Procedure: Left Heart Cath and Coronary Angiography;  Surgeon: [name], MD;  Location: [facility] [facility];  Service: Cardiovascular;  Laterality: N/A;   PERIPHERAL VASCULAR INTERVENTION Left 12/14/2018   Procedure: PERIPHERAL VASCULAR INTERVENTION;  Surgeon: [name], MD;  Location: [facility];  Service: Cardiovascular;  Laterality: Left;  SFA   PERIPHERAL VASCULAR INTERVENTION Left 04/25/2019   Procedure: PERIPHERAL VASCULAR INTERVENTION;  Surgeon: [name], MD;  Location: [facility];  Service: Cardiovascular;  Laterality: Left;  right superficial femoral, and left iliac   PERIPHERAL VASCULAR INTERVENTION Left 07/18/2019   Procedure: PERIPHERAL VASCULAR INTERVENTION;  Surgeon: [name], MD;  Location: [facility];  Service: Cardiovascular;  Laterality: Left;  external iliac    REDUCTION MAMMAPLASTY  2002   TONSILLECTOMY     TUBAL LIGATION  1996   VAGINAL HYSTERECTOMY  08/04/2005   and cysto   Patient Active Problem List   Diagnosis Date Noted   Diabetes mellitus ([place]) 01/29/2021   Hypertension associated with stage 5 chronic kidney disease due to type 2 diabetes mellitus ([place]) 01/29/2021   Tumoral calcinosis 01/29/2021   Dysphagia    Nausea 09/12/2019   Diarrhea, unspecified 09/12/2019   Pruritus, unspecified 08/13/2019   Anaphylactic shock, unspecified, sequela 08/13/2019   Allergy, unspecified, sequela 08/13/2019   Unspecified cataract 03/19/2019   Unspecified asthma with (acute) exacerbation 03/19/2019   COVID-19 03/06/2019   Intractable nausea and vomiting 02/24/2019   Abdominal pain, epigastric 02/24/2019   Multifocal pneumonia 02/24/2019   Shortness of breath 01/19/2019   Cerebrovascular small vessel disease    Generalized weakness    Abscess of sacrum (HCC)    Gluteal abscess 12/25/2018   Moderate protein-calorie malnutrition ([place]) 12/19/2018   Other disorders resulting from impaired renal tubular function 12/19/2018   Liver disease, unspecified 12/19/2018   Hyperkalemia 12/19/2018  Acidosis 12/19/2018   Hyperlipidemia 11/10/2018   History of arteriosclerotic vascular disease 11/10/2018   Carotid stenosis 11/10/2018   Symptomatic anemia 09/16/2018   Peripheral vascular disease, unspecified ([place]) 09/04/2018   Other disorders of bilirubin metabolism 01/20/2018   Other long term (current) drug therapy 11/28/2017   Other abnormal findings in urine 11/28/2017   Dependence on renal dialysis ([place]) 11/28/2017   ESRD on peritoneal dialysis ([place]) 59/74/1638   Complication of vascular dialysis catheter 09/15/2017   History of anemia due to CKD 07/18/2017   DVT (deep vein thrombosis) in pregnancy 07/18/2017   Encounter for removal of sutures 07/07/2017   Iron deficiency anemia, unspecified 06/30/2017   Secondary hyperparathyroidism of renal  origin ([place]) 06/24/2017   Pain, unspecified 06/24/2017   Fever, unspecified 06/24/2017   End stage renal disease ([place]) 06/24/2017   Coagulation defect, unspecified ([place]) 06/24/2017   Anemia in chronic kidney disease 06/24/2017   Chronic diastolic CHF (congestive heart failure) ([place]) 06/18/2017   Moderate persistent asthma 06/10/2017   Adjustment disorder with depressed mood 09/08/2016   CKD (chronic kidney disease) stage 4, GFR 15-29 ml/min (HCC)    CAD S/P mLAD PCI with DES 08/06/2016   Presence of drug coated stent in LAD coronary artery 08/06/2016   Atherosclerotic heart disease of native coronary artery without angina pectoris 07/11/2016   Carotid bruit present 06/15/2016   Dyslipidemia 06/15/2016   Smoker 06/15/2016   Neuropathy 03/10/2016   Essential hypertension, benign 12/11/2015   Left facial pain 03/04/2015   Abnormal sense of taste 03/04/2015   Gastroparesis    Uncontrolled type 2 diabetes mellitus with peripheral neuropathy 06/05/2014   Yeast infection of the vagina 07/10/2010    REFERRING DIAG: E83.59 (ICD-10-CM) - Tumoral calcinosis G89.29 (ICD-10-CM) - Other chronic pain     THERAPY DIAG:  Muscle weakness (generalized)  Unsteadiness on feet  Difficulty in walking, not elsewhere classified  Rationale for Evaluation and Treatment Rehabilitation  PERTINENT HISTORY: multiple comorbidity, see PMH  PRECAUTIONS: Fall  SUBJECTIVE: Still experiencing a lot of pain in the left hip and extends to calf.   PAIN:  Are you having pain? Yes: NPRS scale: 10/10 Pain location: L hip Pain description: always painful, throbbing, burning, tingling Aggravating factors: all movements Relieving factors: ice, heating pads   OBJECTIVE:     TODAY'S TREATMENT: 06/23/2021  HEP review 3x10  [facility] PT Assessment - 06/23/21 0001       Standardized Balance Assessment   Standardized Balance Assessment Berg Balance Test;Timed Up and Go Test;Dynamic Gait Index      Berg Balance  Test   Sit to Stand Able to stand  independently using hands    Standing Unsupported Able to stand safely 2 minutes    Sitting with Back Unsupported but Feet Supported on Floor or Stool Able to sit safely and securely 2 minutes    Stand to Sit Sits safely with minimal use of hands    Transfers Able to transfer safely, definite need of hands    Standing Unsupported with Eyes Closed Able to stand 10 seconds safely    Standing Unsupported with Feet Together Able to place feet together independently and stand 1 minute safely    From Standing, Reach Forward with Outstretched Arm Can reach confidently >25 cm (10")    From Standing Position, Pick up Object from Floor Able to pick up shoe safely and easily    From Standing Position, Turn to Look Behind Over each Shoulder Looks behind from both sides and  weight shifts well    Turn 360 Degrees Able to turn 360 degrees safely but slowly    Standing Unsupported, Alternately Place Feet on Step/Stool Able to stand independently and complete 8 steps >20 seconds    Standing Unsupported, One Foot in Front Able to take small step independently and hold 30 seconds    Standing on One Leg Able to lift leg independently and hold 5-10 seconds    Total Score 48      Dynamic Gait Index   Level Surface Mild Impairment    Change in Gait Speed Moderate Impairment    Gait with Horizontal Head Turns Mild Impairment    Gait with Vertical Head Turns Mild Impairment    Gait and Pivot Turn Mild Impairment    Step Over Obstacle Moderate Impairment    Step Around Obstacles Mild Impairment    Steps Moderate Impairment    Total Score 13      Timed Up and Go Test   TUG Normal TUG    Normal TUG (seconds) 21              Gait training with cane and mod I to improve safety with mobility and stair/curb negotiation.   Access Code: [email or web address] URL: [email or web address] Date: 06/02/2021 Prepared by: [facility]  Exercises - Supine Quadricep Sets  - 1 x daily - 3-4 x weekly - 2-3 sets - 10 reps - 3 sec hold - Supine Gluteal Sets  - 1 x daily - 3-4 x weekly - 2 sets - 10 reps - 3 sec hold - Hip Flexor/Ankle stretching  - 1-2 x daily - 7 x weekly - 1 sets - 3 reps - 30-60 sec hold - Side to side weightshift  - 1-2 x daily - 7 x weekly - 1-2 sets - 10 reps - Standing Lumbar Spine Flexion Stretch Counter  - 1-2 x daily - 7 x weekly - 1-2 sets - 10 reps -Tandem stance 3x10 sec -Standing in corner, feet together eyes closed 3x10 sec  PATIENT EDUCATION: Education details: Educated in HEP additions; also educated in use of ice massage for pain control/how pt can perform at home Person educated: Patient Education method: Explanation Education comprehension: verbalized understanding   GOALS: Goals reviewed with patient? Yes   SHORT TERM GOALS: Target date: 06/09/2021      Patient will be independent in HEP to improve functional outcomes Baseline: Goal status: MET       LONG TERM GOALS: Target date: 07/07/2021    Demo reduced risk for falls per score 45/56 Berg Balance Baseline: 40/56, 48/56 at visit 4 Goal status: MET   2.  Demo reduced risk for falls per score 19/24 DGI Baseline: 13/24 Goal status: NOT MET   3.  Reduce risk for falls per time 15 sec TUG test Baseline: 20.80 sec with cane Goal status: NOT MET   4.  Improve left quad strength to 4/5 to improve stance stability Baseline: 3+/5 Goal status: NOT MET     ASSESSMENT:   CLINICAL IMPRESSION: Demo good HEP recall of initial strengthening for BLE to maintain strength and activity tolerance.  Pt notes limited activity tolerance due to presence of constant pain in her left hip area which extends posterior thigh,calf, heel of LLE which she relates to tumor sitting on sciatic nerve.  Demo improved Berg Balance Test score to achieve low fall risk but continues to manifest high risk per TUG test and DGI.  Pt reports she feels  comfortable in self-progression for exercise and would like to D/C to HEP at this time   OBJECTIVE IMPAIRMENTS Abnormal gait, decreased activity tolerance, decreased balance, decreased endurance, decreased mobility, difficulty walking, decreased ROM, decreased strength, impaired perceived functional ability, and pain.    ACTIVITY LIMITATIONS cleaning, driving, meal prep, laundry, and shopping.    PERSONAL FACTORS Past/current experiences, Time since onset of injury/illness/exacerbation, and 3+ comorbidities: multiple (see PMH)  are also affecting patient's functional outcome.      REHAB POTENTIAL: Fair due to degree of deficits and interaction of conditions   CLINICAL DECISION MAKING: Evolving/moderate complexity   EVALUATION COMPLEXITY: Moderate   PLAN: PT FREQUENCY: 1x/week   PT DURATION: 4 weeks   PLANNED INTERVENTIONS: Therapeutic exercises, Therapeutic activity, Neuromuscular re-education, Balance training, Gait training, Patient/Family education, Joint mobilization, Stair training, Orthotic/Fit training, DME instructions, Dry Needling, Electrical stimulation, Wheelchair mobility training, Cryotherapy, Moist heat, Taping, Ultrasound, Ionotophoresis 4mg /ml Dexamethasone, and Manual therapy   PLAN FOR NEXT SESSION: D/C to HEP  [employer], PT 06/23/2021, 11:04 AM

## 2021-06-24 ENCOUNTER — Ambulatory Visit: Payer: Medicare Other | Attending: Family Medicine | Admitting: Family Medicine

## 2021-06-24 ENCOUNTER — Encounter: Payer: Self-pay | Admitting: Family Medicine

## 2021-06-24 DIAGNOSIS — R2689 Other abnormalities of gait and mobility: Secondary | ICD-10-CM | POA: Insufficient documentation

## 2021-06-24 DIAGNOSIS — E1151 Type 2 diabetes mellitus with diabetic peripheral angiopathy without gangrene: Secondary | ICD-10-CM | POA: Diagnosis not present

## 2021-06-24 DIAGNOSIS — I2510 Atherosclerotic heart disease of native coronary artery without angina pectoris: Secondary | ICD-10-CM | POA: Insufficient documentation

## 2021-06-24 DIAGNOSIS — E1122 Type 2 diabetes mellitus with diabetic chronic kidney disease: Secondary | ICD-10-CM | POA: Diagnosis not present

## 2021-06-24 DIAGNOSIS — Z79899 Other long term (current) drug therapy: Secondary | ICD-10-CM | POA: Insufficient documentation

## 2021-06-24 DIAGNOSIS — F32A Depression, unspecified: Secondary | ICD-10-CM | POA: Insufficient documentation

## 2021-06-24 DIAGNOSIS — G8929 Other chronic pain: Secondary | ICD-10-CM | POA: Diagnosis not present

## 2021-06-24 DIAGNOSIS — E1136 Type 2 diabetes mellitus with diabetic cataract: Secondary | ICD-10-CM | POA: Diagnosis not present

## 2021-06-24 DIAGNOSIS — E1143 Type 2 diabetes mellitus with diabetic autonomic (poly)neuropathy: Secondary | ICD-10-CM | POA: Diagnosis not present

## 2021-06-24 DIAGNOSIS — J45909 Unspecified asthma, uncomplicated: Secondary | ICD-10-CM | POA: Insufficient documentation

## 2021-06-24 DIAGNOSIS — N186 End stage renal disease: Secondary | ICD-10-CM | POA: Insufficient documentation

## 2021-06-24 DIAGNOSIS — L299 Pruritus, unspecified: Secondary | ICD-10-CM | POA: Diagnosis not present

## 2021-06-24 DIAGNOSIS — N2581 Secondary hyperparathyroidism of renal origin: Secondary | ICD-10-CM | POA: Diagnosis not present

## 2021-06-24 DIAGNOSIS — K3184 Gastroparesis: Secondary | ICD-10-CM | POA: Diagnosis not present

## 2021-06-24 DIAGNOSIS — Z992 Dependence on renal dialysis: Secondary | ICD-10-CM | POA: Diagnosis not present

## 2021-06-24 DIAGNOSIS — D509 Iron deficiency anemia, unspecified: Secondary | ICD-10-CM | POA: Diagnosis not present

## 2021-06-24 DIAGNOSIS — M25559 Pain in unspecified hip: Secondary | ICD-10-CM | POA: Insufficient documentation

## 2021-06-24 DIAGNOSIS — I120 Hypertensive chronic kidney disease with stage 5 chronic kidney disease or end stage renal disease: Secondary | ICD-10-CM | POA: Diagnosis not present

## 2021-06-24 DIAGNOSIS — D631 Anemia in chronic kidney disease: Secondary | ICD-10-CM | POA: Diagnosis not present

## 2021-06-24 MED ORDER — MISC. DEVICES MISC: 0 refills | Status: AC

## 2021-06-24 NOTE — Progress Notes (Signed)
Office notes for motorized scooter needs to states that she needs the scooter for inside use not outside use.  Has paperwork to get handrails outside her apartment.

## 2021-06-24 NOTE — Progress Notes (Addendum)
Subjective:  Patient ID: Susan Horton, female    DOB: 1970-12-11  Age: 51 y.o. MRN: [id number]  CC: Hip Pain   HPI Susan Horton is a 51 y.o. year old female with a history of Type 2 Diabetes Mellitus (A1c 4.9 from care everywhere 04/20/2021), Gastroparesis, (s/p Botox injection of the pylorus) Hypertension, CAD, ESRD on hemodialysis, Asthma  Interval History: She needs an electric scooter  to move around the house and perform ADLs (like getting to the bathroom to shower, getting to the kitchen to prepare meals and going to the bedroom) and she is able to transfer off and on the scooter safely, able to operate the tiller. A cane or walker will not suffice due to reduced strength in her legs and a manual wheelchair will not meet her needs. She has the mental and physical capacity to operate a scooter.  Her L hip mass is getting bigger and this is making her depressed. She had a second opinion with Ortho and she was informed due to a risk of nerve damage she would not be a surgical candidate.  She ambulates with the aid of a cane.  She also needs a form completed for her landlord so they can place hand rails in her apartment. Currently seeing Pain management and she was referred to Physical Therapy which has been ineffective. She was previously on Gabapentin, Cymbalta, Lyrica which were all ineffective and discontinued.  She is currently on amitriptyline. Past Medical History:  Diagnosis Date   Abnormal stress test    Anemia    Angio-edema    Asthma    CAD (coronary artery disease)    DES to mid LAD July 2018, residual moderate RCA disease   Cataract    CKD (chronic kidney disease) stage 4, GFR 15-29 ml/min (HCC)    Dialysis T/Th/Sa   DVT (deep venous thrombosis) ([place])    1996 during pregnancy, 2015 left leg   Eczema    Essential hypertension 12/11/2015   Gastroparesis    GERD (gastroesophageal reflux disease)    Gluteal abscess 12/27/2018   Hidradenitis    Migraine    Neuropathy     Peripheral vascular disease (HCC)    blood clot in leg   Progressive angina ([place]) 06/05/2014   Chest pain   Type 2 diabetes mellitus ([place])    Type II diabetes mellitus ([place])    Urticaria     Past Surgical History:  Procedure Laterality Date   A/V FISTULAGRAM N/A 06/22/2017   Procedure: A/V FISTULAGRAM - left arm;  Surgeon: [name], MD;  Location: [facility];  Service: Cardiovascular;  Laterality: N/A;   ABDOMINAL AORTOGRAM W/LOWER EXTREMITY Bilateral 12/14/2018   Procedure: ABDOMINAL AORTOGRAM W/LOWER EXTREMITY;  Surgeon: [name], MD;  Location: [facility];  Service: Cardiovascular;  Laterality: Bilateral;   ABDOMINAL AORTOGRAM W/LOWER EXTREMITY N/A 04/25/2019   Procedure: ABDOMINAL AORTOGRAM W/LOWER EXTREMITY;  Surgeon: [name], MD;  Location: [facility];  Service: Cardiovascular;  Laterality: N/A;   ABDOMINAL AORTOGRAM W/LOWER EXTREMITY Left 07/18/2019   Procedure: ABDOMINAL AORTOGRAM W/LOWER EXTREMITY;  Surgeon: [name], MD;  Location: [facility];  Service: Cardiovascular;  Laterality: Left;   ABDOMINOPLASTY     ADENOIDECTOMY     APPENDECTOMY  1995   AV FISTULA PLACEMENT Left 02/01/2017   Procedure: ARTERIOVENOUS BRACHIOCEPHALIC (AV) FISTULA CREATION;  Surgeon: [name], MD;  Location: [facility];  Service: Vascular;  Laterality: Left;  CHOLECYSTECTOMY  1993   CORONARY ANGIOPLASTY WITH STENT PLACEMENT     CORONARY STENT INTERVENTION N/A 08/05/2016   Procedure: Coronary Stent Intervention;  Surgeon: [name], MD;  Location: [facility];  Service: Cardiovascular;  Laterality: N/A;   ESOPHAGOGASTRODUODENOSCOPY (EGD) WITH PROPOFOL N/A 07/01/2020   Procedure: ESOPHAGOGASTRODUODENOSCOPY (EGD) WITH PROPOFOL;  Surgeon: Gatha Mayer, MD;  Location: [facility];  Service: Endoscopy;  Laterality: N/A;   EXPLORATORY LAPAROTOMY  08/14/2005   lysis of adhesions, drainage of tubo-ovarian  abscess   FISTULA SUPERFICIALIZATION Left 09/14/2017   Procedure: FISTULA SUPERFICIALIZATION ARTERIOVENOUS FISTULA LEFT ARM;  Surgeon: [name], MD;  Location: [facility];  Service: Vascular;  Laterality: Left;   FLEXIBLE SIGMOIDOSCOPY N/A 01/04/2019   Procedure: FLEXIBLE SIGMOIDOSCOPY;  Surgeon: Clarene Essex, MD;  Location: [facility];  Service: Endoscopy;  Laterality: N/A;   HYDRADENITIS EXCISION  02/08/2011   Procedure: EXCISION HYDRADENITIS GROIN;  Surgeon: [name], MD;  Location: [facility];  Service: General;  Laterality: N/A;  Excisioin of Hidradenitis Left groin   INCISION AND DRAINAGE ABSCESS N/A 01/11/2019   Procedure: INCISION AND DRAINAGE SACRAL ABSCESS;  Surgeon: Georganna Skeans, MD;  Location: [facility];  Service: General;  Laterality: N/A;   INGUINAL HIDRADENITIS EXCISION  07/06/2010   bilateral   INSERTION OF DIALYSIS CATHETER N/A 09/14/2017   Procedure: INSERTION OF TUNNELED DIALYSIS CATHETER Right Internal Jugular;  Surgeon: [name], MD;  Location: [facility];  Service: Vascular;  Laterality: N/A;   IR FLUORO GUIDE CV LINE RIGHT  03/01/2019   IR US GUIDE BX ASP/DRAIN  01/01/2019   IR US GUIDE VASC ACCESS RIGHT  03/01/2019   LEFT HEART CATH AND CORONARY ANGIOGRAPHY N/A 08/05/2016   Procedure: Left Heart Cath and Coronary Angiography;  Surgeon: [name], MD;  Location: [facility];  Service: Cardiovascular;  Laterality: N/A;   PERIPHERAL VASCULAR INTERVENTION Left 12/14/2018   Procedure: PERIPHERAL VASCULAR INTERVENTION;  Surgeon: [name], MD;  Location: [facility];  Service: Cardiovascular;  Laterality: Left;  SFA   PERIPHERAL VASCULAR INTERVENTION Left 04/25/2019   Procedure: PERIPHERAL VASCULAR INTERVENTION;  Surgeon: [name], MD;  Location: [facility];  Service: Cardiovascular;  Laterality: Left;  right superficial femoral, and left iliac   PERIPHERAL VASCULAR INTERVENTION Left 07/18/2019    Procedure: PERIPHERAL VASCULAR INTERVENTION;  Surgeon: [name], MD;  Location: [facility];  Service: Cardiovascular;  Laterality: Left;  external iliac   REDUCTION MAMMAPLASTY  2002   TONSILLECTOMY     TUBAL LIGATION  1996   VAGINAL HYSTERECTOMY  08/04/2005   and cysto    Family History  Problem Relation Age of Onset   Allergic rhinitis Mother    Hypertension Father    Allergic rhinitis Father    Hypertension Sister    Allergic rhinitis Sister    Hypertension Brother    Allergic rhinitis Brother    Breast cancer Cousin        x2    Social History   Socioeconomic History   Marital status: Single    Spouse name: Not on file   Number of children: 3   Years of education: 14   Highest education level: Not on file  Occupational History   Occupation: Curator  Tobacco Use   Smoking status: Former    Years: 20.00    Types: Cigarettes    Quit date: 11/2016    Years since  quitting: 4.6   Smokeless tobacco: Never  Vaping Use   Vaping Use: Never used  Substance and Sexual Activity   Alcohol use: No    Alcohol/week: 0.0 standard drinks of alcohol   Drug use: Yes    Frequency: 2.0 times per week    Types: Marijuana   Sexual activity: Not on file  Other Topics Concern   Not on file  Social History Narrative   Lives with daughter in a 2 story home.  Has 3 children.  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  Education: college.    Social Determinants of Health   Financial Resource Strain: Not on file  Food Insecurity: No Food Insecurity (10/05/2020)   Hunger Vital Sign    Worried About Running Out of Food in the Last Year: Never true    Ran Out of Food in the Last Year: Never true  Transportation Needs: No Transportation Needs (10/05/2020)   PRAPARE - Hydrologist (Medical): No    Lack of Transportation (Non-Medical): No  Physical Activity: Inactive (10/05/2020)   Exercise Vital Sign    Days  of Exercise per Week: 0 days    Minutes of Exercise per Session: 0 min  Stress: Not on file  Social Connections: Unknown (10/05/2020)   Social Connection and Isolation Panel [NHANES]    Frequency of Communication with Friends and Family: More than three times a week    Frequency of Social Gatherings with Friends and Family: More than three times a week    Attends Religious Services: 1 to 4 times per year    Active Member of [employer] or Organizations: No    Attends Music therapist: Never    Marital Status: Not on file    Allergies  Allergen Reactions   Penicillins Anaphylaxis, Itching, Swelling, Rash and Other (See Comments)    Swelling of throat & whole mouth  Has patient had a PCN reaction causing immediate rash, facial/tongue/throat swelling, SOB or lightheadedness with hypotension: Yes Has patient had a PCN reaction causing severe rash involving mucus membranes or skin necrosis: No Has patient had a PCN reaction that required hospitalization: No Has patient had a PCN reaction occurring within the last 10 years: No If all of the above answers are "NO", then may proceed with Cephalosporin use.   Repatha [Evolocumab] Itching   Lisinopril Cough    Outpatient Medications Prior to Visit  Medication Sig Dispense Refill   acetaminophen-codeine (TYLENOL #3) 300-30 MG tablet Take 1 tablet by mouth at bedtime as needed for moderate pain. 30 tablet 0   albuterol (VENTOLIN HFA) 108 (90 Base) MCG/ACT inhaler Inhale 1-2 puffs into the lungs every 6 (six) hours as needed for wheezing or shortness of breath. 18 g 2   amitriptyline (ELAVIL) 10 MG tablet Take 1 tablet (10 mg total) by mouth at bedtime. 30 tablet 1   amLODipine (NORVASC) 10 MG tablet TAKE 1 TABLET BY MOUTH EVERY DAY 90 tablet 3   aspirin 81 MG chewable tablet Chew 1 tablet (81 mg total) by mouth daily. 30 tablet 0   atorvastatin (LIPITOR) 40 MG tablet TAKE 1 TABLET BY MOUTH EVERY DAY 90 tablet 1   B Complex-C-Zn-Folic Acid  (DIALYVITE/ZINC) TABS Take 1 tablet by mouth daily.     calcitRIOL (ROCALTROL) 0.5 MCG capsule Take 1.5 mcg by mouth daily.     calcium acetate (PHOSLO) 667 MG capsule Take 667 mg by mouth 3 (three) times daily  with meals.  10   clopidogrel (PLAVIX) 75 MG tablet TAKE 1 TABLET (75 MG TOTAL) BY MOUTH DAILY WITH BREAKFAST. 90 tablet 3   cyclobenzaprine (FLEXERIL) 10 MG tablet Take 1 tablet (10 mg total) by mouth 2 (two) times daily as needed. 60 tablet 1   Fluticasone-Salmeterol (ADVAIR) 250-50 MCG/DOSE AEPB Inhale 1 puff into the lungs 2 (two) times daily. 60 each 3   hydrALAZINE (APRESOLINE) 50 MG tablet Take 50 mg by mouth every 8 (eight) hours.     HYDROcodone-acetaminophen (NORCO) 10-325 MG tablet Take 0.5 tablets by mouth every 8 (eight) hours as needed for severe pain. 8 tablet 0   ipratropium-albuterol (DUONEB) 0.5-2.5 (3) MG/3ML SOLN Take 3 mLs by nebulization every 6 (six) hours as needed. 360 mL 2   iron sucrose (VENOFER) 20 MG/ML injection Iron Sucrose (Venofer)     Methoxy PEG-Epoetin Beta (MIRCERA IJ) Inject into the skin.     metoprolol succinate (TOPROL-XL) 50 MG 24 hr tablet TAKE 1.5 TABLETS BY MOUTH DAILY 135 tablet 3   montelukast (SINGULAIR) 10 MG tablet Take 1 tablet (10 mg total) by mouth at bedtime. 30 tablet 5   nitroGLYCERIN (NITROSTAT) 0.4 MG SL tablet Place 1 tablet (0.4 mg total) under the tongue every 5 (five) minutes as needed for chest pain. 25 tablet 2   ondansetron (ZOFRAN ODT) 4 MG disintegrating tablet Take 1 tablet (4 mg total) by mouth every 8 (eight) hours as needed for nausea or vomiting. 20 tablet 0   pantoprazole (PROTONIX) 40 MG tablet Take 1 tablet (40 mg total) by mouth daily. 30 tablet 0   PRALUENT 75 MG/ML SOAJ INJECT 75 MG INTO THE SKIN EVERY 14 (FOURTEEN) DAYS. 2 mL 11   sucralfate (CARAFATE) 1 GM/10ML suspension Take 10 mLs (1 g total) by mouth 4 (four) times daily -  with meals and at bedtime. 420 mL 0   Misc. Devices MISC Electric scooter for mobility  1 each 0   isosorbide mononitrate (IMDUR) 30 MG 24 hr tablet TAKE 0.5 TABLETS (15 MG TOTAL) BY MOUTH DAILY. 45 tablet 3   No facility-administered medications prior to visit.     ROS Review of Systems  Constitutional:  Negative for activity change and appetite change.  HENT:  Negative for sinus pressure and sore throat.   Respiratory:  Negative for chest tightness, shortness of breath and wheezing.   Cardiovascular:  Negative for chest pain and palpitations.  Gastrointestinal:  Negative for abdominal distention, abdominal pain and constipation.  Genitourinary: Negative.   Musculoskeletal: Negative.   Psychiatric/Behavioral:  Negative for behavioral problems and dysphoric mood.     Objective:  BP (!) 111/57   Pulse 96   Temp 98 F (36.7 C) (Oral)   Ht '5\' 5"'$  (1.651 m)   Wt 138 lb 12.8 oz (63 kg)   SpO2 100%   BMI 23.10 kg/m      06/24/2021   10:26 AM 05/10/2021    1:38 PM 05/10/2021   11:26 AM  BP/Weight  Systolic BP 481 856 314  Diastolic BP 57 87 84  Wt. (Lbs) 138.8    BMI 23.1 kg/m2        Physical Exam Constitutional:      Appearance: She is well-developed.  Cardiovascular:     Rate and Rhythm: Normal rate.     Heart sounds: Normal heart sounds. No murmur heard. Pulmonary:     Effort: Pulmonary effort is normal.     Breath sounds: Normal breath sounds. No  wheezing or rales.  Chest:     Chest wall: No tenderness.  Abdominal:     General: Bowel sounds are normal. There is no distension.     Palpations: Abdomen is soft. There is no mass.     Tenderness: There is no abdominal tenderness.  Musculoskeletal:     Right lower leg: No edema.     Left lower leg: No edema.     Comments: Left hip large hard induration which is tender to palpation, no erythema  Neurological:     Mental Status: She is alert and oriented to person, place, and time.     Comments: Strength: Bilateral upper extremity-4/5 Bilateral lower extremity-3+/5  Psychiatric:        Mood and  Affect: Mood normal.        Latest Ref Rng & Units 07/01/2020    8:32 AM 06/29/2020    1:10 PM 03/22/2020   11:35 AM  CMP  Glucose 70 - 99 mg/dL 81  97  105   BUN 6 - 20 mg/dL 46  26  24   Creatinine 0.44 - 1.00 mg/dL 8.62  6.09  8.18   Sodium 135 - 145 mmol/L 139  138  137   Potassium 3.5 - 5.1 mmol/L 4.0  3.7  4.0   Chloride 98 - 111 mmol/L 94  93  94   CO2 22 - 32 mmol/L '26  31  27   '$ Calcium 8.9 - 10.3 mg/dL 9.2  9.6  10.2   Total Protein 6.5 - 8.1 g/dL  7.1  8.1   Total Bilirubin 0.3 - 1.2 mg/dL  1.0  1.2   Alkaline Phos 38 - 126 U/L  101  79   AST 15 - 41 U/L  21  16   ALT 0 - 44 U/L  24  12     Lipid Panel     Component Value Date/Time   CHOL 152 01/19/2019 1448   TRIG 149 01/19/2019 1448   HDL 36 (L) 01/19/2019 1448   CHOLHDL 4.2 01/19/2019 1448   CHOLHDL 7.2 Ratio 01/01/2010 2055   VLDL 30 01/01/2010 2055   LDLCALC 90 01/19/2019 1448    CBC    Component Value Date/Time   WBC 5.9 07/01/2020 0832   RBC 4.07 07/01/2020 0832   HGB 9.7 (L) 07/01/2020 0832   HGB 9.2 (L) 07/26/2016 0000   HCT 32.2 (L) 07/01/2020 0832   HCT 30.7 (L) 07/26/2016 0000   PLT 224 07/01/2020 0832   PLT 474 (H) 07/26/2016 0000   MCV 79.1 (L) 07/01/2020 0832   MCV 82 07/26/2016 0000   MCH 23.8 (L) 07/01/2020 0832   MCHC 30.1 07/01/2020 0832   RDW 19.4 (H) 07/01/2020 0832   RDW 13.6 07/26/2016 0000   LYMPHSABS 1.1 06/29/2020 1310   LYMPHSABS 2.9 07/26/2016 0000   MONOABS 0.4 06/29/2020 1310   EOSABS 0.1 06/29/2020 1310   EOSABS 0.1 07/26/2016 0000   BASOSABS 0.0 06/29/2020 1310   BASOSABS 0.0 07/26/2016 0000    Lab Results  Component Value Date   HGBA1C 4.9 06/30/2020    Assessment & Plan:  1. Tumoral calcinosis She will benefit from electric scooter We will fax notes over to [facility] I have completed form for her landlord as requested - Misc. Devices MISC; Electric scooter for mobility  Dispense: 1 each; Refill: 0  2. Other chronic pain Advised to notify pain clinic  that her pain is currently uncontrolled and PT has been ineffective -  Misc. Devices [place]; Electric scooter for mobility  Dispense: 1 each; Refill: 0  3. Functional gait abnormality - Misc. Devices MISC; Electric scooter for mobility  Dispense: 1 each; Refill: 0    Meds ordered this encounter  Medications   Misc. Devices MISC    Sig: Transport planner for mobility    Dispense:  1 each    Refill:  0    Follow-up: Return for follow up, keep previously scheduled appointment.       Susan Rakes, MD, FAAFP. [facility] [address], [place]   06/24/2021, 12:51 PM

## 2021-06-26 DIAGNOSIS — D509 Iron deficiency anemia, unspecified: Secondary | ICD-10-CM | POA: Diagnosis not present

## 2021-06-26 DIAGNOSIS — L299 Pruritus, unspecified: Secondary | ICD-10-CM | POA: Diagnosis not present

## 2021-06-26 DIAGNOSIS — Z992 Dependence on renal dialysis: Secondary | ICD-10-CM | POA: Diagnosis not present

## 2021-06-26 DIAGNOSIS — N2581 Secondary hyperparathyroidism of renal origin: Secondary | ICD-10-CM | POA: Diagnosis not present

## 2021-06-26 DIAGNOSIS — N186 End stage renal disease: Secondary | ICD-10-CM | POA: Diagnosis not present

## 2021-06-26 DIAGNOSIS — D631 Anemia in chronic kidney disease: Secondary | ICD-10-CM | POA: Diagnosis not present

## 2021-06-29 DIAGNOSIS — D509 Iron deficiency anemia, unspecified: Secondary | ICD-10-CM | POA: Diagnosis not present

## 2021-06-29 DIAGNOSIS — N2581 Secondary hyperparathyroidism of renal origin: Secondary | ICD-10-CM | POA: Diagnosis not present

## 2021-06-29 DIAGNOSIS — L299 Pruritus, unspecified: Secondary | ICD-10-CM | POA: Diagnosis not present

## 2021-06-29 DIAGNOSIS — D631 Anemia in chronic kidney disease: Secondary | ICD-10-CM | POA: Diagnosis not present

## 2021-06-29 DIAGNOSIS — Z992 Dependence on renal dialysis: Secondary | ICD-10-CM | POA: Diagnosis not present

## 2021-06-29 DIAGNOSIS — N186 End stage renal disease: Secondary | ICD-10-CM | POA: Diagnosis not present

## 2021-06-30 ENCOUNTER — Encounter (HOSPITAL_COMMUNITY): Payer: Medicare Other

## 2021-06-30 ENCOUNTER — Telehealth: Payer: Self-pay | Admitting: Family Medicine

## 2021-06-30 ENCOUNTER — Ambulatory Visit: Payer: Medicare Other | Admitting: Vascular Surgery

## 2021-06-30 NOTE — Telephone Encounter (Signed)
Copied from [place] [phone]. Topic: General - Inquiry >> Jun 29, 2021  3:39 PM Leilani Able wrote: Reason for CRM: PT is calling in stating that her wheelchair request has come back with a message they are awaiting paperwork from Dr Margarita Rana and have faxed her the info and are waiting for feedback from PCP please beaware. FU with pt. [phone]

## 2021-07-01 NOTE — Telephone Encounter (Signed)
Paperwork has been received and will be completed this week for patient. Patient will be called once paperwork is complete.

## 2021-07-03 DIAGNOSIS — N186 End stage renal disease: Secondary | ICD-10-CM | POA: Diagnosis not present

## 2021-07-03 DIAGNOSIS — Z992 Dependence on renal dialysis: Secondary | ICD-10-CM | POA: Diagnosis not present

## 2021-07-06 DIAGNOSIS — Z992 Dependence on renal dialysis: Secondary | ICD-10-CM | POA: Diagnosis not present

## 2021-07-06 DIAGNOSIS — N186 End stage renal disease: Secondary | ICD-10-CM | POA: Diagnosis not present

## 2021-07-06 NOTE — Telephone Encounter (Signed)
Pt called for update on paperwork request, [phone] Call back request

## 2021-07-06 NOTE — Telephone Encounter (Signed)
Has chart note been updated for patient's [employer].

## 2021-07-07 ENCOUNTER — Telehealth: Payer: Self-pay

## 2021-07-07 NOTE — Telephone Encounter (Signed)
Patient called back stats [employer] can;t give her the wheelchair or electric scooter because notes says she has neuropathy in her hand and she was told by [employer] if that note is taken out she can be authorized for the electric scooter.

## 2021-07-07 NOTE — Telephone Encounter (Signed)
Done

## 2021-07-08 DIAGNOSIS — N186 End stage renal disease: Secondary | ICD-10-CM | POA: Diagnosis not present

## 2021-07-08 DIAGNOSIS — K3184 Gastroparesis: Secondary | ICD-10-CM | POA: Diagnosis not present

## 2021-07-08 DIAGNOSIS — L299 Pruritus, unspecified: Secondary | ICD-10-CM | POA: Diagnosis not present

## 2021-07-08 DIAGNOSIS — D509 Iron deficiency anemia, unspecified: Secondary | ICD-10-CM | POA: Diagnosis not present

## 2021-07-08 DIAGNOSIS — D631 Anemia in chronic kidney disease: Secondary | ICD-10-CM | POA: Diagnosis not present

## 2021-07-08 DIAGNOSIS — Z992 Dependence on renal dialysis: Secondary | ICD-10-CM | POA: Diagnosis not present

## 2021-07-08 DIAGNOSIS — N2581 Secondary hyperparathyroidism of renal origin: Secondary | ICD-10-CM | POA: Diagnosis not present

## 2021-07-08 NOTE — Telephone Encounter (Signed)
Done

## 2021-07-10 DIAGNOSIS — N2581 Secondary hyperparathyroidism of renal origin: Secondary | ICD-10-CM | POA: Diagnosis not present

## 2021-07-10 DIAGNOSIS — D631 Anemia in chronic kidney disease: Secondary | ICD-10-CM | POA: Diagnosis not present

## 2021-07-10 DIAGNOSIS — Z992 Dependence on renal dialysis: Secondary | ICD-10-CM | POA: Diagnosis not present

## 2021-07-10 DIAGNOSIS — L299 Pruritus, unspecified: Secondary | ICD-10-CM | POA: Diagnosis not present

## 2021-07-10 DIAGNOSIS — D509 Iron deficiency anemia, unspecified: Secondary | ICD-10-CM | POA: Diagnosis not present

## 2021-07-10 DIAGNOSIS — N186 End stage renal disease: Secondary | ICD-10-CM | POA: Diagnosis not present

## 2021-07-11 DIAGNOSIS — Z992 Dependence on renal dialysis: Secondary | ICD-10-CM | POA: Diagnosis not present

## 2021-07-11 DIAGNOSIS — N186 End stage renal disease: Secondary | ICD-10-CM | POA: Diagnosis not present

## 2021-07-11 DIAGNOSIS — E1122 Type 2 diabetes mellitus with diabetic chronic kidney disease: Secondary | ICD-10-CM | POA: Diagnosis not present

## 2021-07-13 DIAGNOSIS — N2581 Secondary hyperparathyroidism of renal origin: Secondary | ICD-10-CM | POA: Diagnosis not present

## 2021-07-13 DIAGNOSIS — D631 Anemia in chronic kidney disease: Secondary | ICD-10-CM | POA: Diagnosis not present

## 2021-07-13 DIAGNOSIS — L299 Pruritus, unspecified: Secondary | ICD-10-CM | POA: Diagnosis not present

## 2021-07-13 DIAGNOSIS — N186 End stage renal disease: Secondary | ICD-10-CM | POA: Diagnosis not present

## 2021-07-13 DIAGNOSIS — Z992 Dependence on renal dialysis: Secondary | ICD-10-CM | POA: Diagnosis not present

## 2021-07-13 DIAGNOSIS — D509 Iron deficiency anemia, unspecified: Secondary | ICD-10-CM | POA: Diagnosis not present

## 2021-07-13 DIAGNOSIS — D649 Anemia, unspecified: Secondary | ICD-10-CM | POA: Diagnosis not present

## 2021-07-15 DIAGNOSIS — D631 Anemia in chronic kidney disease: Secondary | ICD-10-CM | POA: Diagnosis not present

## 2021-07-15 DIAGNOSIS — L299 Pruritus, unspecified: Secondary | ICD-10-CM | POA: Diagnosis not present

## 2021-07-15 DIAGNOSIS — N2581 Secondary hyperparathyroidism of renal origin: Secondary | ICD-10-CM | POA: Diagnosis not present

## 2021-07-15 DIAGNOSIS — Z992 Dependence on renal dialysis: Secondary | ICD-10-CM | POA: Diagnosis not present

## 2021-07-15 DIAGNOSIS — D509 Iron deficiency anemia, unspecified: Secondary | ICD-10-CM | POA: Diagnosis not present

## 2021-07-15 DIAGNOSIS — N186 End stage renal disease: Secondary | ICD-10-CM | POA: Diagnosis not present

## 2021-07-15 NOTE — Telephone Encounter (Signed)
Pt called requesting forms be sent to [facility].Per pt they still do not have needed paperwork.

## 2021-07-15 NOTE — Telephone Encounter (Signed)
FYI

## 2021-07-16 NOTE — Telephone Encounter (Signed)
Secure email sent to [facility] inquiring if she has received the documentation needed to process the order for the motorized scooter

## 2021-07-17 DIAGNOSIS — L299 Pruritus, unspecified: Secondary | ICD-10-CM | POA: Diagnosis not present

## 2021-07-17 DIAGNOSIS — N2581 Secondary hyperparathyroidism of renal origin: Secondary | ICD-10-CM | POA: Diagnosis not present

## 2021-07-17 DIAGNOSIS — Z992 Dependence on renal dialysis: Secondary | ICD-10-CM | POA: Diagnosis not present

## 2021-07-17 DIAGNOSIS — D631 Anemia in chronic kidney disease: Secondary | ICD-10-CM | POA: Diagnosis not present

## 2021-07-17 DIAGNOSIS — N186 End stage renal disease: Secondary | ICD-10-CM | POA: Diagnosis not present

## 2021-07-17 DIAGNOSIS — D509 Iron deficiency anemia, unspecified: Secondary | ICD-10-CM | POA: Diagnosis not present

## 2021-07-20 ENCOUNTER — Encounter (HOSPITAL_COMMUNITY): Payer: Self-pay

## 2021-07-20 ENCOUNTER — Emergency Department (HOSPITAL_COMMUNITY)
Admission: EM | Admit: 2021-07-20 | Discharge: 2021-07-20 | Disposition: A | Discharge status: Home / Self Care | Payer: Medicare Other | Attending: Emergency Medicine | Admitting: Emergency Medicine

## 2021-07-20 ENCOUNTER — Telehealth (HOSPITAL_COMMUNITY): Payer: Self-pay | Admitting: Student

## 2021-07-20 ENCOUNTER — Other Ambulatory Visit: Payer: Self-pay

## 2021-07-20 ENCOUNTER — Emergency Department (HOSPITAL_COMMUNITY): Payer: Medicare Other

## 2021-07-20 DIAGNOSIS — E119 Type 2 diabetes mellitus without complications: Secondary | ICD-10-CM | POA: Insufficient documentation

## 2021-07-20 DIAGNOSIS — Z992 Dependence on renal dialysis: Secondary | ICD-10-CM | POA: Insufficient documentation

## 2021-07-20 DIAGNOSIS — Z7982 Long term (current) use of aspirin: Secondary | ICD-10-CM | POA: Insufficient documentation

## 2021-07-20 DIAGNOSIS — N2581 Secondary hyperparathyroidism of renal origin: Secondary | ICD-10-CM | POA: Diagnosis not present

## 2021-07-20 DIAGNOSIS — M25552 Pain in left hip: Secondary | ICD-10-CM | POA: Diagnosis not present

## 2021-07-20 DIAGNOSIS — L299 Pruritus, unspecified: Secondary | ICD-10-CM | POA: Diagnosis not present

## 2021-07-20 DIAGNOSIS — Z794 Long term (current) use of insulin: Secondary | ICD-10-CM | POA: Diagnosis not present

## 2021-07-20 DIAGNOSIS — N186 End stage renal disease: Secondary | ICD-10-CM | POA: Diagnosis not present

## 2021-07-20 DIAGNOSIS — L942 Calcinosis cutis: Secondary | ICD-10-CM | POA: Diagnosis not present

## 2021-07-20 DIAGNOSIS — I739 Peripheral vascular disease, unspecified: Secondary | ICD-10-CM | POA: Diagnosis not present

## 2021-07-20 DIAGNOSIS — D509 Iron deficiency anemia, unspecified: Secondary | ICD-10-CM | POA: Diagnosis not present

## 2021-07-20 DIAGNOSIS — M7989 Other specified soft tissue disorders: Secondary | ICD-10-CM | POA: Diagnosis present

## 2021-07-20 DIAGNOSIS — E1122 Type 2 diabetes mellitus with diabetic chronic kidney disease: Secondary | ICD-10-CM | POA: Diagnosis not present

## 2021-07-20 DIAGNOSIS — D631 Anemia in chronic kidney disease: Secondary | ICD-10-CM | POA: Diagnosis not present

## 2021-07-20 MED ORDER — OXYCODONE-ACETAMINOPHEN 5-325 MG PO TABS: 1.0000 | ORAL_TABLET | Freq: Three times a day (TID) | ORAL | 0 refills | Status: DC | PRN

## 2021-07-20 MED ORDER — OXYCODONE-ACETAMINOPHEN 5-325 MG PO TABS
1.0000 | ORAL_TABLET | Freq: Once | ORAL | Status: AC
  Administered 2021-07-20: 1 via ORAL
  Filled 2021-07-20: qty 1

## 2021-07-20 NOTE — Discharge Instructions (Addendum)
You need to follow-up with Dr. Sharol Given.  He will likely want you in with physical therapy however speak with him about this.  Also, follow-up with your pain management doctor for continued treatment of this chronic pain.  I have sent 10 pills worth of oxycodone-Tylenol to the pharmacy.  Only use this for extremely severe pain.

## 2021-07-20 NOTE — ED Triage Notes (Signed)
Pt arrived POV from home c/o left hip x1 year d/t calcium buildup. Pt states they told her they could not do surgery d/t nerves and they could damage her ability to walk with surgery. Pt states someone needs to do something she cannot take the pain.

## 2021-07-20 NOTE — Telephone Encounter (Signed)
I have received multiple calls from the patient and [facility] stating that her Percocet is not there.  I will send it to the [facility] next-door.  [facility] was notified that if [facility] for some reason does go through it needs to be canceled.

## 2021-07-20 NOTE — ED Provider Triage Note (Signed)
Emergency Medicine Provider Triage Evaluation Note  [name] , a 51 y.o. female  was evaluated in triage.  Patient has a history of tumoral calcinosis.  Says her pain is intolerable.  They are unable to do surgery due to compression of one of her nerves.  Not helping at home.  Review of Systems  Positive: Pain Negative: Loss of sensation  Physical Exam  BP (!) 121/52 (BP Location: Right Arm)   Pulse 98   Temp 98.6 F (37 C) (Oral)   Resp 15   Ht '5\' 5"'$  (1.651 m)   Wt 62 kg   BMI 22.75 kg/m  Gen:   Awake, no distress   Resp:  Normal effort  MSK:   Moves extremities without difficulty  Other:  Tenderness to left lateral hip.  Palpable mass.  Medical Decision Making  Medically screening exam initiated at 10:35 AM.  Appropriate orders placed.  [name] was informed that the remainder of the evaluation will be completed by another provider, this initial triage assessment does not replace that evaluation, and the importance of remaining in the ED until their evaluation is complete.   X-ray ordered.   Rhae Hammock, PA-C 07/20/21 1035

## 2021-07-20 NOTE — ED Provider Notes (Signed)
[facility] EMERGENCY DEPARTMENT Provider Note   CSN: [id number] Arrival date & time: 07/20/21  2119     History  Chief Complaint  Patient presents with   Hip Pain    Susan Horton is a 51 y.o. 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  She says that orthopedic surgery says that they were unable to do any operations due to the location of the tumor.  Calcinosis is reportedly sitting on a nerve and she may lose function of her leg if they operate.  The pain radiates all throughout her leg.     Hip Pain       Home Medications Prior to Admission medications   Medication Sig Start Date End Date Taking? Authorizing Provider  acetaminophen-codeine (TYLENOL #3) 300-30 MG tablet Take 1 tablet by mouth at bedtime as needed for moderate pain. 05/11/21   Newt Minion, MD  albuterol (VENTOLIN HFA) 108 (90 Base) MCG/ACT inhaler Inhale 1-2 puffs into the lungs every 6 (six) hours as needed for wheezing or shortness of breath. 07/02/20   Mariel Aloe, MD  amitriptyline (ELAVIL) 10 MG tablet Take 1 tablet (10 mg total) by mouth at bedtime. 09/19/18   Jamse Arn, MD  amLODipine (NORVASC) 10 MG tablet TAKE 1 TABLET BY MOUTH EVERY DAY 07/28/20   [name], MD  aspirin 81 MG chewable tablet Chew 1 tablet (81 mg total) by mouth daily. 08/07/16   Cheryln Manly, NP  atorvastatin (LIPITOR) 40 MG tablet TAKE 1 TABLET BY MOUTH EVERY DAY 12/22/20   [name], MD  B Complex-C-Zn-Folic Acid (DIALYVITE/ZINC) TABS Take 1 tablet by mouth daily. 03/25/20   [provider]  calcitRIOL (ROCALTROL) 0.5 MCG capsule Take 1.5 mcg by mouth daily. 09/11/18   [provider]  calcium acetate (PHOSLO) 667 MG capsule Take 667 mg by mouth 3 (three) times daily with meals. 09/23/17   [provider]  clopidogrel  (PLAVIX) 75 MG tablet TAKE 1 TABLET (75 MG TOTAL) BY MOUTH DAILY WITH BREAKFAST. 07/28/20   [name], MD  cyclobenzaprine (FLEXERIL) 10 MG tablet Take 1 tablet (10 mg total) by mouth 2 (two) times daily as needed. 08/06/20   Charlott Rakes, MD  Fluticasone-Salmeterol (ADVAIR) 250-50 MCG/DOSE AEPB Inhale 1 puff into the lungs 2 (two) times daily. 06/10/17   Charlott Rakes, MD  hydrALAZINE (APRESOLINE) 50 MG tablet Take 50 mg by mouth every 8 (eight) hours. 06/18/20   [provider]  HYDROcodone-acetaminophen (NORCO) 10-325 MG tablet Take 0.5 tablets by mouth every 8 (eight) hours as needed for severe pain. 05/10/21   Harris, Abigail, PA-C  ipratropium-albuterol (DUONEB) 0.5-2.5 (3) MG/3ML SOLN Take 3 mLs by nebulization every 6 (six) hours as needed. 07/02/20   Mariel Aloe, MD  iron sucrose (VENOFER) 20 MG/ML injection Iron Sucrose (Venofer) 07/11/19   [provider]  isosorbide mononitrate (IMDUR) 30 MG 24 hr tablet TAKE 0.5 TABLETS (15 MG TOTAL) BY MOUTH DAILY. 07/28/20 10/26/20  [name], MD  Methoxy PEG-Epoetin Beta (MIRCERA IJ) Inject into the skin. 07/09/19   [provider]  metoprolol succinate (TOPROL-XL) 50 MG 24 hr tablet TAKE 1.5 TABLETS BY MOUTH DAILY 07/28/20   [name], MD  Misc. Devices MISC Electric scooter for mobility 06/24/21   Charlott Rakes, MD  montelukast (SINGULAIR)  10 MG tablet Take 1 tablet (10 mg total) by mouth at bedtime. 04/12/18   Kennith Gain, MD  nitroGLYCERIN (NITROSTAT) 0.4 MG SL tablet Place 1 tablet (0.4 mg total) under the tongue every 5 (five) minutes as needed for chest pain. 08/06/19   [name], MD  ondansetron (ZOFRAN ODT) 4 MG disintegrating tablet Take 1 tablet (4 mg total) by mouth every 8 (eight) hours as needed for nausea or vomiting. 07/02/20   Mariel Aloe, MD  pantoprazole (PROTONIX) 40 MG tablet Take 1 tablet (40 mg total) by mouth daily. 07/02/20   Mariel Aloe, MD  PRALUENT 75  MG/ML SOAJ INJECT 75 MG INTO THE SKIN EVERY 14 (FOURTEEN) DAYS. 02/07/20   [name], MD  sucralfate (CARAFATE) 1 GM/10ML suspension Take 10 mLs (1 g total) by mouth 4 (four) times daily -  with meals and at bedtime. 03/22/20   Jacqlyn Larsen, PA-C      Allergies    Penicillins, Repatha [evolocumab], and Lisinopril    Review of Systems   Review of Systems  Physical Exam Updated Vital Signs BP 120/63 (BP Location: Right Arm)   Pulse 85   Temp 98.6 F (37 C) (Oral)   Resp 18   Ht '5\' 5"'$  (1.651 m)   Wt 62 kg   SpO2 100%   BMI 22.75 kg/m  Physical Exam Vitals and nursing note reviewed.  Constitutional:      Appearance: Normal appearance.  HENT:     Head: Normocephalic and atraumatic.  Eyes:     General: No scleral icterus.    Conjunctiva/sclera: Conjunctivae normal.  Cardiovascular:     Comments: Difficulty palpating and finding dopplerable DP pulse on the left side.  Able to find very small amount of blood flow, monophasic.  Sensation intact.  Unable to assess cap refill due to nail polish. Pulmonary:     Effort: Pulmonary effort is normal. No respiratory distress.  Musculoskeletal:        General: Tenderness and deformity present. Normal range of motion.  Skin:    Findings: No rash.  Neurological:     Mental Status: She is alert.  Psychiatric:        Mood and Affect: Mood normal.     ED Results / Procedures / Treatments   Labs (all labs ordered are listed, but only abnormal results are displayed) Labs Reviewed - No data to display  EKG None  Radiology DG Hip Unilat W or Wo Pelvis 2-3 Views Left  Result Date: 07/20/2021 CLINICAL DATA:  History of pain left hip for 1 year. History of chronic calcifications left hip. No known injury EXAM: DG HIP (WITH OR WITHOUT PELVIS) 2-3V LEFT COMPARISON:  AP pelvis and right hip series 05/10/2021. Left hip MRI 08/27/2020. Left hip series 08/06/2020. FINDINGS: Continued progression of very prominent  calcification/ossification noted about the proximal left femur, now measuring 20.8 x 14.0 cm, previously measuring 13.8 x 11.3 cm. Again a process such as tumoral calcinosis or myositis ossificans could present in this fashion. Subtle stable areas of calcification/ossification noted adjacent to the proximal right femur without interim change. No acute or focal bony abnormality identified. No evidence of fracture or dislocation. No bony erosions noted. Diffuse peripheral vascular calcification noted. Iliac and femoral vascular stents noted. IMPRESSION: 1. Continued progression of very prominent calcification/ossification noted about the proximal left femur, now measuring 20.8 x 14.0 cm, previously measuring 13.8 x 11.3 cm. Again a process such as tumoral calcinosis or  myositis ossificans could present in this fashion. No acute or focal bony abnormalities identified. 2. Stable faint calcifications/ossifications about the proximal right femur. 3.  Peripheral vascular disease with vascular stents as above. Electronically Signed   By: Marcello Moores  Register M.D.   On: 07/20/2021 11:04    Procedures Procedures    Medications Ordered in ED Medications  oxyCODONE-acetaminophen (PERCOCET/ROXICET) 5-325 MG per tablet 1 tablet (1 tablet Oral Given 07/20/21 1034)    ED Course/ Medical Decision Making/ A&P                           Medical Decision Making Amount and/or Complexity of Data Reviewed Radiology: ordered.  Risk Prescription drug management.   51 year old female presenting today with left hip pain secondary to her known calcinosis.  Per chart review, she was seen by orthopedics in May who suggested that she start physical therapy and prescribed her narcotic pain medication.  She says that she was not notified of the medication and never picked it up.  Imaging: Hip x-ray ordered, viewed and individually interpreted by me.  Clear advancement of her calcinosis.  Treatment: Given Percocet, reports  resolution of her pain on reevaluation  MDM/disposition: I am unsure whether or not patient picked up her codeine-acetaminophen in May.  Regardless it has been over 2 months and I believe it is reasonable to give her 10 pills of Percocet until she is able to get in with her pain management.  She also understands she needs to see the orthopedics again to discuss options due to the progressive nature of her tumoral calcinosis.  She is agreeable to this.  She also sees her cardiologist this week who will recheck her distal pulses being that they were difficult to find via Doppler.  Discharged in ambulatory condition.  Neurovascularly intact   Final Clinical Impression(s) / ED Diagnoses Final diagnoses:  Tumoral calcinosis    Rx / DC Orders ED Discharge Orders          Ordered    oxyCODONE-acetaminophen (PERCOCET/ROXICET) 5-325 MG tablet  Every 8 hours PRN        07/20/21 1407           Results and diagnoses were explained to the patient. Return precautions discussed in full. Patient had no additional questions and expressed complete understanding.   This chart was dictated using voice recognition software.  Despite best efforts to proofread,  errors can occur which can change the documentation meaning.    Rhae Hammock, PA-C 07/20/21 1411    Godfrey Pick, MD 07/22/21 [phone]

## 2021-07-21 ENCOUNTER — Ambulatory Visit (INDEPENDENT_AMBULATORY_CARE_PROVIDER_SITE_OTHER): Payer: Medicare Other | Admitting: Cardiovascular Disease

## 2021-07-21 ENCOUNTER — Encounter: Payer: Self-pay | Admitting: Cardiovascular Disease

## 2021-07-21 VITALS — BP 138/73 | HR 93 | Ht 65.0 in | Wt 138.8 lb

## 2021-07-21 DIAGNOSIS — E1140 Type 2 diabetes mellitus with diabetic neuropathy, unspecified: Secondary | ICD-10-CM | POA: Diagnosis not present

## 2021-07-21 DIAGNOSIS — E785 Hyperlipidemia, unspecified: Secondary | ICD-10-CM | POA: Diagnosis not present

## 2021-07-21 DIAGNOSIS — I10 Essential (primary) hypertension: Secondary | ICD-10-CM | POA: Diagnosis not present

## 2021-07-21 DIAGNOSIS — I739 Peripheral vascular disease, unspecified: Secondary | ICD-10-CM | POA: Diagnosis not present

## 2021-07-21 DIAGNOSIS — G894 Chronic pain syndrome: Secondary | ICD-10-CM | POA: Diagnosis not present

## 2021-07-21 NOTE — Assessment & Plan Note (Signed)
History of hyperlipidemia on Praluent and atorvastatin.  We will recheck a lipid liver profile.

## 2021-07-21 NOTE — Telephone Encounter (Signed)
Pt stated [employer] reached out to her directly and advised her that we sent the same document; it was a duplicate, the questions needed to be answered were not answered, and the note that says she has neuropathy in her hand needs to be removed.  Pt is requesting a call back.   Please advise.

## 2021-07-21 NOTE — Progress Notes (Signed)
07/21/2021 [name]   May 12, 1970  [id number]  Primary Physician Charlott Rakes, MD Primary Cardiologist: [name] MD [name], [place]  HPI:  Susan Horton is a 51 y.o.   moderately overweight single African-American female mother of 80, grandmother and 4 grandchildren who I last saw in the office   10/10/2019.  She has a history of treated hypertension, hyperlipidemia and diabetes which she's had for the last 22 years. She has CKD 3  with creatinine running in the mid 3 range followed by Dr. Jimmy Footman. She's noticed chest pain over the last 3-4 months occurring several times a week lasting minutes at a time worse with exertion. She had a Myoview stress test was read as intermediate risk with inferior ischemia. Based on this, I decided to proceed with outpatient radial diagnostic coronary angiography using limited contrast. This was performed 08/05/16 the right femoral approach revealed an 80% mid LAD which was stented using a synergy drug-eluting stent reducing 80% stenosis to 0% residual. She did have moderate mid RCA stenosis which was not addressed. She had left main spasm probably related to the guide catheter doing a procedure that responded to intra-coronary nitroglycerin. Her chest pain has resolved and she has stopped smoking since. She did have carotid Dopplers performed because of bruits that showed mild to moderate left ICA stenosis which will be followed on an annual basis. Since I saw her 4 months ago she has done well.  She gets occasional chest pain during hemodialysis.  She recently had a peritoneal dialysis placed yesterday and is planning on transitioning to home peritoneal dialysis.  We have also talked about going on the kidney transplant list although her hemoglobin A1c is still elevated in the 11 range.  She has stopped smoking.  She was recently begun on Repatha for resistant hyperlipidemia but unfortunately there was a lapse in her insurance last several months.   She will restart this.    She was tolerating peritoneal dialysis without difficulty.      She has had right SFA intervention by Dr. Carlis Abbott recently.  She had mentioned at her last visit that she was complaining of some mild increase in dyspnea on exertion.  As a result of this, I ordered a 2D echo 12/22/2018 which was normal and a Myoview stress test 12/21/2018 which was normal as well.   She was admitted in February for sacral abscess and since that time has had worsening fatigue weakness and shortness of breath.   Since I saw her 2 years ago she is remained stable.  She is getting hemodialysis.  Dr. Carlis Abbott has intervene on her right SFA and left extrailiac artery which she follows.  She denies claudication, chest pain or shortness of breath.   Current Meds  Medication Sig   acetaminophen-codeine (TYLENOL #3) 300-30 MG tablet Take 1 tablet by mouth at bedtime as needed for moderate pain.   albuterol (VENTOLIN HFA) 108 (90 Base) MCG/ACT inhaler Inhale 1-2 puffs into the lungs every 6 (six) hours as needed for wheezing or shortness of breath.   amitriptyline (ELAVIL) 10 MG tablet Take 1 tablet (10 mg total) by mouth at bedtime.   amLODipine (NORVASC) 10 MG tablet TAKE 1 TABLET BY MOUTH EVERY DAY   aspirin 81 MG chewable tablet Chew 1 tablet (81 mg total) by mouth daily.   atorvastatin (LIPITOR) 40 MG tablet TAKE 1 TABLET BY MOUTH EVERY DAY   B Complex-C-Zn-Folic Acid (DIALYVITE/ZINC) TABS Take 1 tablet  by mouth daily.   calcitRIOL (ROCALTROL) 0.5 MCG capsule Take 1.5 mcg by mouth daily.   calcium acetate (PHOSLO) 667 MG capsule Take 667 mg by mouth 3 (three) times daily with meals.   clopidogrel (PLAVIX) 75 MG tablet TAKE 1 TABLET (75 MG TOTAL) BY MOUTH DAILY WITH BREAKFAST.   cyclobenzaprine (FLEXERIL) 10 MG tablet Take 1 tablet (10 mg total) by mouth 2 (two) times daily as needed.   Fluticasone-Salmeterol (ADVAIR) 250-50 MCG/DOSE AEPB Inhale 1 puff into the lungs 2 (two) times daily.    hydrALAZINE (APRESOLINE) 50 MG tablet Take 50 mg by mouth every 8 (eight) hours.   HYDROcodone-acetaminophen (NORCO) 10-325 MG tablet Take 0.5 tablets by mouth every 8 (eight) hours as needed for severe pain.   ipratropium-albuterol (DUONEB) 0.5-2.5 (3) MG/3ML SOLN Take 3 mLs by nebulization every 6 (six) hours as needed.   iron sucrose (VENOFER) 20 MG/ML injection Iron Sucrose (Venofer)   Methoxy PEG-Epoetin Beta (MIRCERA IJ) Inject into the skin.   metoprolol succinate (TOPROL-XL) 50 MG 24 hr tablet TAKE 1.5 TABLETS BY MOUTH DAILY   Misc. Devices MISC Electric scooter for mobility   montelukast (SINGULAIR) 10 MG tablet Take 1 tablet (10 mg total) by mouth at bedtime.   nitroGLYCERIN (NITROSTAT) 0.4 MG SL tablet Place 1 tablet (0.4 mg total) under the tongue every 5 (five) minutes as needed for chest pain.   ondansetron (ZOFRAN ODT) 4 MG disintegrating tablet Take 1 tablet (4 mg total) by mouth every 8 (eight) hours as needed for nausea or vomiting.   oxyCODONE-acetaminophen (PERCOCET/ROXICET) 5-325 MG tablet Take 1 tablet by mouth every 8 (eight) hours as needed for severe pain.   pantoprazole (PROTONIX) 40 MG tablet Take 1 tablet (40 mg total) by mouth daily.   PRALUENT 75 MG/ML SOAJ INJECT 75 MG INTO THE SKIN EVERY 14 (FOURTEEN) DAYS.   sucralfate (CARAFATE) 1 GM/10ML suspension Take 10 mLs (1 g total) by mouth 4 (four) times daily -  with meals and at bedtime.     Allergies  Allergen Reactions   Penicillins Anaphylaxis, Itching, Swelling, Rash and Other (See Comments)    Swelling of throat & whole mouth  Has patient had a PCN reaction causing immediate rash, facial/tongue/throat swelling, SOB or lightheadedness with hypotension: Yes Has patient had a PCN reaction causing severe rash involving mucus membranes or skin necrosis: No Has patient had a PCN reaction that required hospitalization: No Has patient had a PCN reaction occurring within the last 10 years: No If all of the above  answers are "NO", then may proceed with Cephalosporin use.   Repatha [Evolocumab] Itching   Lisinopril Cough    Social History   Socioeconomic History   Marital status: Single    Spouse name: Not on file   Number of children: 3   Years of education: 14   Highest education level: Not on file  Occupational History   Occupation: Curator  Tobacco Use   Smoking status: Former    Years: 20.00    Types: Cigarettes    Quit date: 11/2016    Years since quitting: 4.6   Smokeless tobacco: Never  Vaping Use   Vaping Use: Never used  Substance and Sexual Activity   Alcohol use: No    Alcohol/week: 0.0 standard drinks of alcohol   Drug use: Yes    Frequency: 2.0 times per week    Types: Marijuana   Sexual activity: Not on file  Other Topics Concern   Not on  file  Social History Narrative   Lives with daughter in a 2 story home.  Has 3 children.  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  Education: college.    Social Determinants of Health   Financial Resource Strain: Not on file  Food Insecurity: No Food Insecurity (10/05/2020)   Hunger Vital Sign    Worried About Running Out of Food in the Last Year: Never true    Ran Out of Food in the Last Year: Never true  Transportation Needs: No Transportation Needs (10/05/2020)   PRAPARE - Hydrologist (Medical): No    Lack of Transportation (Non-Medical): No  Physical Activity: Inactive (10/05/2020)   Exercise Vital Sign    Days of Exercise per Week: 0 days    Minutes of Exercise per Session: 0 min  Stress: Not on file  Social Connections: Unknown (10/05/2020)   Social Connection and Isolation Panel [NHANES]    Frequency of Communication with Friends and Family: More than three times a week    Frequency of Social Gatherings with Friends and Family: More than three times a week    Attends Religious Services: 1 to 4 times per year    Active Member of [employer] or Organizations: No     Attends Archivist Meetings: Never    Marital Status: Not on file  Intimate Partner Violence: Not on file     Review of Systems: General: negative for chills, fever, night sweats or weight changes.  Cardiovascular: negative for chest pain, dyspnea on exertion, edema, orthopnea, palpitations, paroxysmal nocturnal dyspnea or shortness of breath Dermatological: negative for rash Respiratory: negative for cough or wheezing Urologic: negative for hematuria Abdominal: negative for nausea, vomiting, diarrhea, bright red blood per rectum, melena, or hematemesis Neurologic: negative for visual changes, syncope, or dizziness All other systems reviewed and are otherwise negative except as noted above.    Blood pressure 138/73, pulse 93, height '5\' 5"'$  (1.651 m), weight 138 lb 12.8 oz (63 kg), SpO2 100 %.  General appearance: alert and no distress Neck: no adenopathy, no JVD, supple, symmetrical, trachea midline, thyroid not enlarged, symmetric, no tenderness/mass/nodules, and bilateral carotid bruits Lungs: clear to auscultation bilaterally Heart: regular rate and rhythm, S1, S2 normal, no murmur, click, rub or gallop Extremities: extremities normal, atraumatic, no cyanosis or edema Pulses: 2+ and symmetric Skin: Skin color, texture, turgor normal. No rashes or lesions Neurologic: Grossly normal  EKG sinus rhythm at 91 with lateral Q waves.  Personally reviewed this EKG.  ASSESSMENT AND PLAN:   CAD S/P mLAD PCI with DES History of CAD status post mid LAD intervention by myself using a Synergy drug-eluting stent 08/05/2016.  She has had no cardiac issues since.  Myoview performed 12/21/2018 was nonischemic.  She denies chest pain.  Peripheral vascular disease, unspecified ([place]) History of peripheral vascular disease status post right SFA intervention as well as left external iliac artery intervention by Dr. Fortunato Curling in the past who follows with Doppler studies.  Essential  hypertension, benign History of essential hypertension blood pressure measured today 138/73.  She is on amlodipine, metoprolol and hydralazine.  Dyslipidemia History of hyperlipidemia on Praluent and atorvastatin.  We will recheck a lipid liver profile.     [name] MD FACP,FACC,FAHA, [facility] 07/21/2021 11:30 AM

## 2021-07-21 NOTE — Assessment & Plan Note (Signed)
History of CAD status post mid LAD intervention by myself using a Synergy drug-eluting stent 08/05/2016.  She has had no cardiac issues since.  Myoview performed 12/21/2018 was nonischemic.  She denies chest pain.

## 2021-07-21 NOTE — Assessment & Plan Note (Signed)
History of essential hypertension blood pressure measured today 138/73.  She is on amlodipine, metoprolol and hydralazine.

## 2021-07-21 NOTE — Assessment & Plan Note (Signed)
History of peripheral vascular disease status post right SFA intervention as well as left external iliac artery intervention by Dr. Fortunato Curling in the past who follows with Doppler studies.

## 2021-07-21 NOTE — Patient Instructions (Signed)
Medication Instructions:  Your physician recommends that you continue on your current medications as directed. Please refer to the Current Medication list given to you today.  *If you need a refill on your cardiac medications before your next appointment, please call your pharmacy*   Lab Work: Your physician recommends that you return for lab work in: the next week or 2 for FASTING lipid/liver panel  If you have labs (blood work) drawn today and your tests are completely normal, you will receive your results only by: [place] (if you have MyChart) OR A paper copy in the mail If you have any lab test that is abnormal or we need to change your treatment, we will call you to review the results.   Follow-Up: At [facility], you and your health needs are our priority.  As part of our continuing mission to provide you with exceptional heart care, we have created designated Provider Care Teams.  These Care Teams include your primary Cardiologist (physician) and Advanced Practice Providers (APPs -  Physician Assistants and Nurse Practitioners) who all work together to provide you with the care you need, when you need it.  We recommend signing up for the patient portal called "MyChart".  Sign up information is provided on this After Visit Summary.  MyChart is used to connect with patients for Virtual Visits (Telemedicine).  Patients are able to view lab/test results, encounter notes, upcoming appointments, etc.  Non-urgent messages can be sent to your provider as well.   To learn more about what you can do with MyChart, go to [email or web address].    Your next appointment:   12 month(s)  The format for your next appointment:   In Person  Provider:   Quay Burow, MD

## 2021-07-22 DIAGNOSIS — L299 Pruritus, unspecified: Secondary | ICD-10-CM | POA: Diagnosis not present

## 2021-07-22 DIAGNOSIS — D509 Iron deficiency anemia, unspecified: Secondary | ICD-10-CM | POA: Diagnosis not present

## 2021-07-22 DIAGNOSIS — N2581 Secondary hyperparathyroidism of renal origin: Secondary | ICD-10-CM | POA: Diagnosis not present

## 2021-07-22 DIAGNOSIS — D631 Anemia in chronic kidney disease: Secondary | ICD-10-CM | POA: Diagnosis not present

## 2021-07-22 DIAGNOSIS — Z992 Dependence on renal dialysis: Secondary | ICD-10-CM | POA: Diagnosis not present

## 2021-07-22 DIAGNOSIS — N186 End stage renal disease: Secondary | ICD-10-CM | POA: Diagnosis not present

## 2021-07-23 NOTE — Telephone Encounter (Signed)
FYI, paperwork has been placed in folder.

## 2021-07-23 NOTE — Telephone Encounter (Signed)
I contacted [employer] and that information is documented in another telephone encounter from today

## 2021-07-23 NOTE — Telephone Encounter (Signed)
Paperwork received from [employer], paperwork was given to Case Manger to view.

## 2021-07-23 NOTE — Telephone Encounter (Signed)
I called [employer] and spoke to [place].  Maudry Diego was not available.  He reviewed he visit note that they have and it is the original note, not the updated note. He requested that the update be faxed to [phone]. I explained that the initial note was updated, there is no addendum.  He stated that the updated information needs to be underlined and initialed.

## 2021-07-24 DIAGNOSIS — D631 Anemia in chronic kidney disease: Secondary | ICD-10-CM | POA: Diagnosis not present

## 2021-07-24 DIAGNOSIS — L299 Pruritus, unspecified: Secondary | ICD-10-CM | POA: Diagnosis not present

## 2021-07-24 DIAGNOSIS — D509 Iron deficiency anemia, unspecified: Secondary | ICD-10-CM | POA: Diagnosis not present

## 2021-07-24 DIAGNOSIS — N2581 Secondary hyperparathyroidism of renal origin: Secondary | ICD-10-CM | POA: Diagnosis not present

## 2021-07-24 DIAGNOSIS — N186 End stage renal disease: Secondary | ICD-10-CM | POA: Diagnosis not present

## 2021-07-24 DIAGNOSIS — Z992 Dependence on renal dialysis: Secondary | ICD-10-CM | POA: Diagnosis not present

## 2021-07-24 NOTE — Telephone Encounter (Signed)
Done

## 2021-07-24 NOTE — Telephone Encounter (Signed)
Office notes has been faxed.

## 2021-07-27 DIAGNOSIS — Z992 Dependence on renal dialysis: Secondary | ICD-10-CM | POA: Diagnosis not present

## 2021-07-27 DIAGNOSIS — D631 Anemia in chronic kidney disease: Secondary | ICD-10-CM | POA: Diagnosis not present

## 2021-07-27 DIAGNOSIS — N2581 Secondary hyperparathyroidism of renal origin: Secondary | ICD-10-CM | POA: Diagnosis not present

## 2021-07-27 DIAGNOSIS — N186 End stage renal disease: Secondary | ICD-10-CM | POA: Diagnosis not present

## 2021-07-27 DIAGNOSIS — D509 Iron deficiency anemia, unspecified: Secondary | ICD-10-CM | POA: Diagnosis not present

## 2021-07-27 DIAGNOSIS — L299 Pruritus, unspecified: Secondary | ICD-10-CM | POA: Diagnosis not present

## 2021-07-28 NOTE — Telephone Encounter (Signed)
I spoke to [facility]- Clinical Documentation # [phone] and she confirmed that the documentation they requested was received on 07/24/2021 and is currently being reviewed. No additional information needed at this time

## 2021-07-29 DIAGNOSIS — D631 Anemia in chronic kidney disease: Secondary | ICD-10-CM | POA: Diagnosis not present

## 2021-07-29 DIAGNOSIS — Z992 Dependence on renal dialysis: Secondary | ICD-10-CM | POA: Diagnosis not present

## 2021-07-29 DIAGNOSIS — N2581 Secondary hyperparathyroidism of renal origin: Secondary | ICD-10-CM | POA: Diagnosis not present

## 2021-07-29 DIAGNOSIS — D509 Iron deficiency anemia, unspecified: Secondary | ICD-10-CM | POA: Diagnosis not present

## 2021-07-29 DIAGNOSIS — N186 End stage renal disease: Secondary | ICD-10-CM | POA: Diagnosis not present

## 2021-07-29 DIAGNOSIS — L299 Pruritus, unspecified: Secondary | ICD-10-CM | POA: Diagnosis not present

## 2021-07-30 ENCOUNTER — Ambulatory Visit: Payer: Medicare Other | Attending: Family Medicine | Admitting: Family Medicine

## 2021-07-30 ENCOUNTER — Encounter: Payer: Self-pay | Admitting: Family Medicine

## 2021-07-30 VITALS — BP 152/60 | HR 87 | Temp 98.0°F | Ht 65.0 in | Wt 136.6 lb

## 2021-07-30 DIAGNOSIS — Z79899 Other long term (current) drug therapy: Secondary | ICD-10-CM | POA: Insufficient documentation

## 2021-07-30 DIAGNOSIS — Z992 Dependence on renal dialysis: Secondary | ICD-10-CM | POA: Insufficient documentation

## 2021-07-30 DIAGNOSIS — I2510 Atherosclerotic heart disease of native coronary artery without angina pectoris: Secondary | ICD-10-CM | POA: Insufficient documentation

## 2021-07-30 DIAGNOSIS — I120 Hypertensive chronic kidney disease with stage 5 chronic kidney disease or end stage renal disease: Secondary | ICD-10-CM | POA: Insufficient documentation

## 2021-07-30 DIAGNOSIS — J45909 Unspecified asthma, uncomplicated: Secondary | ICD-10-CM | POA: Diagnosis not present

## 2021-07-30 DIAGNOSIS — N185 Chronic kidney disease, stage 5: Secondary | ICD-10-CM | POA: Diagnosis not present

## 2021-07-30 DIAGNOSIS — E1169 Type 2 diabetes mellitus with other specified complication: Secondary | ICD-10-CM

## 2021-07-30 DIAGNOSIS — E1143 Type 2 diabetes mellitus with diabetic autonomic (poly)neuropathy: Secondary | ICD-10-CM | POA: Diagnosis not present

## 2021-07-30 DIAGNOSIS — R232 Flushing: Secondary | ICD-10-CM | POA: Diagnosis not present

## 2021-07-30 DIAGNOSIS — N951 Menopausal and female climacteric states: Secondary | ICD-10-CM | POA: Diagnosis not present

## 2021-07-30 DIAGNOSIS — E1122 Type 2 diabetes mellitus with diabetic chronic kidney disease: Secondary | ICD-10-CM | POA: Diagnosis not present

## 2021-07-30 DIAGNOSIS — K3184 Gastroparesis: Secondary | ICD-10-CM | POA: Diagnosis not present

## 2021-07-30 LAB — POCT GLYCOSYLATED HEMOGLOBIN (HGB A1C): HbA1c, POC (controlled diabetic range): 5.3 % (ref 0.0–7.0)

## 2021-07-30 MED ORDER — PROMETHAZINE HCL 25 MG PO TABS: 25.0000 mg | ORAL_TABLET | Freq: Three times a day (TID) | ORAL | 1 refills | Status: AC | PRN

## 2021-07-30 MED ORDER — PAROXETINE HCL 20 MG PO TABS: 20.0000 mg | ORAL_TABLET | Freq: Every day | ORAL | 3 refills | Status: DC

## 2021-07-30 NOTE — Patient Instructions (Signed)
Gastroparesis  Gastroparesis is a condition in which food takes longer than normal to empty from the stomach. This condition is also known as delayed gastric emptying. It is usually a long-term (chronic) condition. There is no cure, but there are treatments and things that you can do at home to help relieve symptoms. Treating the underlying condition that causes gastroparesis can also help relieve symptoms. What are the causes? In many cases, the cause of this condition is not known. Possible causes include: A hormone (endocrine) disorder, such as hypothyroidism or diabetes. A nervous system disease, such as Parkinson's disease or multiple sclerosis. Cancer, infection, or surgery that affects the stomach or vagus nerve. The vagus nerve runs from your chest, through your neck, and to the lower part of your brain. A connective tissue disorder, such as scleroderma. Certain medicines. What increases the risk? You are more likely to develop this condition if: You have certain disorders or diseases. These may include: An endocrine disorder. An eating disorder. Amyloidosis. Scleroderma. Parkinson's disease. Multiple sclerosis. Cancer or infection of the stomach or the vagus nerve. You have had surgery on your stomach or vagus nerve. You take certain medicines. You are female. What are the signs or symptoms? Symptoms of this condition include: Feeling full after eating very little or a loss of appetite. Nausea, vomiting, or heartburn. Bloating of your abdomen. Inconsistent blood sugar (glucose) levels on blood tests. Unexplained weight loss. Acid from the stomach coming up into the esophagus (gastroesophageal reflux). Sudden tightening (spasm) of the stomach, which can be painful. Symptoms may come and go. Some people may not notice any symptoms. How is this diagnosed? This condition is diagnosed with tests, such as: Tests that check how long it takes food to move through the stomach and  intestines. These tests include: Upper gastrointestinal (GI) series. For this test, you drink a liquid that shows up well on X-rays, and then X-rays are taken of your intestines. Gastric emptying scintigraphy. For this test, you eat food that contains a small amount of radioactive material, and then scans are taken. Wireless capsule GI monitoring system. For this test, you swallow a pill (capsule) that records information about how foods and fluid move through your stomach. Gastric manometry. For this test, a tube is passed down your throat and into your stomach to measure electrical and muscular activity. Endoscopy. For this test, a long, thin tube with a camera and light on the end is passed down your throat and into your stomach to check for problems in your stomach lining. Ultrasound. This test uses sound waves to create images of the inside of your body. This can help rule out gallbladder disease or pancreatitis as a cause of your symptoms. How is this treated? There is no cure for this condition, but treatment and home care may relieve symptoms. Treatment may include: Treating the underlying cause. Managing your symptoms by making changes to your diet and exercise habits. Taking medicines to control nausea and vomiting and to stimulate stomach muscles. Getting food through a feeding tube in the hospital. This may be done in severe cases. Having surgery to insert a device called a gastric electrical stimulator into your body. This device helps improve stomach emptying and control nausea and vomiting. Follow these instructions at home: Take over-the-counter and prescription medicines only as told by your health care provider. Follow instructions from your health care provider about eating or drinking restrictions. Your health care provider may recommend that you: Eat smaller meals more often. Eat   low-fat foods. Eat low-fiber forms of high-fiber foods. For example, eat cooked vegetables instead  of raw vegetables. Have only liquid foods instead of solid foods. Liquid foods are easier to digest. Drink enough fluid to keep your urine pale yellow. Exercise as often as told by your health care provider. Keep all follow-up visits. This is important. Contact a health care provider if you: Notice that your symptoms do not improve with treatment. Have new symptoms. Get help right away if you: Have severe pain in your abdomen that does not improve with treatment. Have nausea that is severe or does not go away. Vomit every time you drink fluids. Summary Gastroparesis is a long-term (chronic) condition in which food takes longer than normal to empty from the stomach. Symptoms include nausea, vomiting, heartburn, bloating of your abdomen, and loss of appetite. Eating smaller portions, low-fat foods, and low-fiber forms of high-fiber foods may help you manage your symptoms. Get help right away if you have severe pain in your abdomen. This information is not intended to replace advice given to you by your health care provider. Make sure you discuss any questions you have with your health care provider. Document Revised: 05/07/2019 Document Reviewed: 05/07/2019 Elsevier Patient Education  2023 Elsevier Inc.  

## 2021-07-30 NOTE — Progress Notes (Signed)
Pt wants to discuss order for motor scooter

## 2021-07-30 NOTE — Progress Notes (Signed)
Subjective:  Patient ID: Susan Horton, female    DOB: [birth year]  Age: 51 y.o. MRN: [id number]  CC: Hypertension   HPI Susan Horton is a 51 y.o. year old female with a history of Type 2 Diabetes Mellitus (A1c 5.3), Gastroparesis, (s/p Botox injection of the pylorus) Hypertension, CAD, ESRD on hemodialysis, Asthma  Interval History: She has been throwing up since 4am this morning and she was referred to a new GI as her current one retired. Her appointment comes up next month. When she receives Botox injections and esophageal dilatation her symptoms improved.  She has hot flashes which started this year.  Status post hysterectomy but never experienced the symptoms at the time of her hysterectomy.  Hot flashes occur majorly on her face and throughout the whole day.  Diabetes is diet controlled. She has not had an eye exam but does have an ophthalmologist and plans to schedule an appointment.  She needs paperwork for her Hoveround redone. Past Medical History:  Diagnosis Date   Abnormal stress test    Anemia    Angio-edema    Asthma    CAD (coronary artery disease)    DES to mid LAD July 2018, residual moderate RCA disease   Cataract    CKD (chronic kidney disease) stage 4, GFR 15-29 ml/min (HCC)    Dialysis T/Th/Sa   DVT (deep venous thrombosis) ([place])    1996 during pregnancy, 2015 left leg   Eczema    Essential hypertension 12/11/2015   Gastroparesis    GERD (gastroesophageal reflux disease)    Gluteal abscess 12/27/2018   Hidradenitis    Migraine    Neuropathy    Peripheral vascular disease (HCC)    blood clot in leg   Progressive angina ([place]) 06/05/2014   Chest pain   Type 2 diabetes mellitus ([place])    Type II diabetes mellitus ([place])    Urticaria     Past Surgical History:  Procedure Laterality Date   A/V FISTULAGRAM N/A 06/22/2017   Procedure: A/V FISTULAGRAM - left arm;  Surgeon: [name], MD;  Location: [facility];  Service:  Cardiovascular;  Laterality: N/A;   ABDOMINAL AORTOGRAM W/LOWER EXTREMITY Bilateral 12/14/2018   Procedure: ABDOMINAL AORTOGRAM W/LOWER EXTREMITY;  Surgeon: [name], MD;  Location: [facility];  Service: Cardiovascular;  Laterality: Bilateral;   ABDOMINAL AORTOGRAM W/LOWER EXTREMITY N/A 04/25/2019   Procedure: ABDOMINAL AORTOGRAM W/LOWER EXTREMITY;  Surgeon: [name], MD;  Location: [facility];  Service: Cardiovascular;  Laterality: N/A;   ABDOMINAL AORTOGRAM W/LOWER EXTREMITY Left 07/18/2019   Procedure: ABDOMINAL AORTOGRAM W/LOWER EXTREMITY;  Surgeon: [name], MD;  Location: [facility];  Service: Cardiovascular;  Laterality: Left;   ABDOMINOPLASTY     ADENOIDECTOMY     APPENDECTOMY  1995   AV FISTULA PLACEMENT Left 02/01/2017   Procedure: ARTERIOVENOUS BRACHIOCEPHALIC (AV) FISTULA CREATION;  Surgeon: [name], MD;  Location: [facility];  Service: Vascular;  Laterality: Left;   [place] INTERVENTION N/A 08/05/2016   Procedure: Coronary Stent Intervention;  Surgeon: [name], MD;  Location: [facility];  Service: Cardiovascular;  Laterality: N/A;   ESOPHAGOGASTRODUODENOSCOPY (EGD) WITH PROPOFOL N/A 07/01/2020   Procedure: ESOPHAGOGASTRODUODENOSCOPY (EGD) WITH PROPOFOL;  Surgeon: Gatha Mayer, MD;  Location: [facility];  Service: Endoscopy;  Laterality: N/A;   EXPLORATORY LAPAROTOMY  08/14/2005  lysis of adhesions, drainage of tubo-ovarian abscess   FISTULA SUPERFICIALIZATION Left 09/14/2017   Procedure: FISTULA SUPERFICIALIZATION ARTERIOVENOUS FISTULA LEFT ARM;  Surgeon: [name], MD;  Location: [facility];  Service: Vascular;  Laterality: Left;   FLEXIBLE SIGMOIDOSCOPY N/A 01/04/2019   Procedure: FLEXIBLE SIGMOIDOSCOPY;  Surgeon: Clarene Essex, MD;  Location: [facility];  Service: Endoscopy;  Laterality: N/A;   HYDRADENITIS EXCISION   02/08/2011   Procedure: EXCISION HYDRADENITIS GROIN;  Surgeon: [name], MD;  Location: [facility];  Service: General;  Laterality: N/A;  Excisioin of Hidradenitis Left groin   INCISION AND DRAINAGE ABSCESS N/A 01/11/2019   Procedure: INCISION AND DRAINAGE SACRAL ABSCESS;  Surgeon: Georganna Skeans, MD;  Location: [facility];  Service: General;  Laterality: N/A;   INGUINAL HIDRADENITIS EXCISION  07/06/2010   bilateral   INSERTION OF DIALYSIS CATHETER N/A 09/14/2017   Procedure: INSERTION OF TUNNELED DIALYSIS CATHETER Right Internal Jugular;  Surgeon: [name], MD;  Location: [facility];  Service: Vascular;  Laterality: N/A;   IR FLUORO GUIDE CV LINE RIGHT  03/01/2019   IR US GUIDE BX ASP/DRAIN  01/01/2019   IR US GUIDE VASC ACCESS RIGHT  03/01/2019   LEFT HEART CATH AND CORONARY ANGIOGRAPHY N/A 08/05/2016   Procedure: Left Heart Cath and Coronary Angiography;  Surgeon: [name], MD;  Location: [facility];  Service: Cardiovascular;  Laterality: N/A;   PERIPHERAL VASCULAR INTERVENTION Left 12/14/2018   Procedure: PERIPHERAL VASCULAR INTERVENTION;  Surgeon: [name], MD;  Location: [facility];  Service: Cardiovascular;  Laterality: Left;  SFA   PERIPHERAL VASCULAR INTERVENTION Left 04/25/2019   Procedure: PERIPHERAL VASCULAR INTERVENTION;  Surgeon: [name], MD;  Location: [facility];  Service: Cardiovascular;  Laterality: Left;  right superficial femoral, and left iliac   PERIPHERAL VASCULAR INTERVENTION Left 07/18/2019   Procedure: PERIPHERAL VASCULAR INTERVENTION;  Surgeon: [name], MD;  Location: [facility];  Service: Cardiovascular;  Laterality: Left;  external iliac   REDUCTION MAMMAPLASTY  2002   TONSILLECTOMY     TUBAL LIGATION  1996   VAGINAL HYSTERECTOMY  08/04/2005   and cysto    Family History  Problem Relation Age of Onset   Allergic rhinitis Mother    Hypertension Father    Allergic  rhinitis Father    Hypertension Sister    Allergic rhinitis Sister    Hypertension Brother    Allergic rhinitis Brother    Breast cancer Cousin        x2    Social History   Socioeconomic History   Marital status: Single    Spouse name: Not on file   Number of children: 3   Years of education: 14   Highest education level: Not on file  Occupational History   Occupation: Curator  Tobacco Use   Smoking status: Former    Years: 20.00    Types: Cigarettes    Quit date: 11/2016    Years since quitting: 4.7   Smokeless tobacco: Never  Vaping Use   Vaping Use: Never used  Substance and Sexual Activity   Alcohol use: No    Alcohol/week: 0.0 standard drinks of alcohol   Drug use: Yes    Frequency: 2.0 times per week    Types: Marijuana   Sexual activity: Not on file  Other Topics Concern   Not on file  Social History Narrative   Lives with daughter in a 2 story  home.  Has 3 children.  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  Education: college.    Social Determinants of Health   Financial Resource Strain: Not on file  Food Insecurity: No Food Insecurity (10/05/2020)   Hunger Vital Sign    Worried About Running Out of Food in the Last Year: Never true    Ran Out of Food in the Last Year: Never true  Transportation Needs: No Transportation Needs (10/05/2020)   PRAPARE - Hydrologist (Medical): No    Lack of Transportation (Non-Medical): No  Physical Activity: Inactive (10/05/2020)   Exercise Vital Sign    Days of Exercise per Week: 0 days    Minutes of Exercise per Session: 0 min  Stress: Not on file  Social Connections: Unknown (10/05/2020)   Social Connection and Isolation Panel [NHANES]    Frequency of Communication with Friends and Family: More than three times a week    Frequency of Social Gatherings with Friends and Family: More than three times a week    Attends Religious Services: 1 to 4 times per year     Active Member of [employer] or Organizations: No    Attends Music therapist: Never    Marital Status: Not on file    Allergies  Allergen Reactions   Penicillins Anaphylaxis, Itching, Swelling, Rash and Other (See Comments)    Swelling of throat & whole mouth  Has patient had a PCN reaction causing immediate rash, facial/tongue/throat swelling, SOB or lightheadedness with hypotension: Yes Has patient had a PCN reaction causing severe rash involving mucus membranes or skin necrosis: No Has patient had a PCN reaction that required hospitalization: No Has patient had a PCN reaction occurring within the last 10 years: No If all of the above answers are "NO", then may proceed with Cephalosporin use.   Repatha [Evolocumab] Itching   Lisinopril Cough    Outpatient Medications Prior to Visit  Medication Sig Dispense Refill   acetaminophen-codeine (TYLENOL #3) 300-30 MG tablet Take 1 tablet by mouth at bedtime as needed for moderate pain. 30 tablet 0   albuterol (VENTOLIN HFA) 108 (90 Base) MCG/ACT inhaler Inhale 1-2 puffs into the lungs every 6 (six) hours as needed for wheezing or shortness of breath. 18 g 2   amLODipine (NORVASC) 10 MG tablet TAKE 1 TABLET BY MOUTH EVERY DAY 90 tablet 3   aspirin 81 MG chewable tablet Chew 1 tablet (81 mg total) by mouth daily. 30 tablet 0   atorvastatin (LIPITOR) 40 MG tablet TAKE 1 TABLET BY MOUTH EVERY DAY 90 tablet 1   B Complex-C-Zn-Folic Acid (DIALYVITE/ZINC) TABS Take 1 tablet by mouth daily.     calcitRIOL (ROCALTROL) 0.5 MCG capsule Take 1.5 mcg by mouth daily.     calcium acetate (PHOSLO) 667 MG capsule Take 667 mg by mouth 3 (three) times daily with meals.  10   clopidogrel (PLAVIX) 75 MG tablet TAKE 1 TABLET (75 MG TOTAL) BY MOUTH DAILY WITH BREAKFAST. 90 tablet 3   cyclobenzaprine (FLEXERIL) 10 MG tablet Take 1 tablet (10 mg total) by mouth 2 (two) times daily as needed. 60 tablet 1   Fluticasone-Salmeterol (ADVAIR) 250-50 MCG/DOSE  AEPB Inhale 1 puff into the lungs 2 (two) times daily. 60 each 3   hydrALAZINE (APRESOLINE) 50 MG tablet Take 50 mg by mouth every 8 (eight) hours.     HYDROcodone-acetaminophen (NORCO) 10-325 MG tablet Take 0.5 tablets by mouth every 8 (eight)  hours as needed for severe pain. 8 tablet 0   ipratropium-albuterol (DUONEB) 0.5-2.5 (3) MG/3ML SOLN Take 3 mLs by nebulization every 6 (six) hours as needed. 360 mL 2   iron sucrose (VENOFER) 20 MG/ML injection Iron Sucrose (Venofer)     Methoxy PEG-Epoetin Beta (MIRCERA IJ) Inject into the skin.     metoprolol succinate (TOPROL-XL) 50 MG 24 hr tablet TAKE 1.5 TABLETS BY MOUTH DAILY 135 tablet 3   Misc. Devices MISC Electric scooter for mobility 1 each 0   montelukast (SINGULAIR) 10 MG tablet Take 1 tablet (10 mg total) by mouth at bedtime. 30 tablet 5   nitroGLYCERIN (NITROSTAT) 0.4 MG SL tablet Place 1 tablet (0.4 mg total) under the tongue every 5 (five) minutes as needed for chest pain. 25 tablet 2   ondansetron (ZOFRAN ODT) 4 MG disintegrating tablet Take 1 tablet (4 mg total) by mouth every 8 (eight) hours as needed for nausea or vomiting. 20 tablet 0   oxyCODONE-acetaminophen (PERCOCET/ROXICET) 5-325 MG tablet Take 1 tablet by mouth every 8 (eight) hours as needed for severe pain. 10 tablet 0   pantoprazole (PROTONIX) 40 MG tablet Take 1 tablet (40 mg total) by mouth daily. 30 tablet 0   PRALUENT 75 MG/ML SOAJ INJECT 75 MG INTO THE SKIN EVERY 14 (FOURTEEN) DAYS. 2 mL 11   sucralfate (CARAFATE) 1 GM/10ML suspension Take 10 mLs (1 g total) by mouth 4 (four) times daily -  with meals and at bedtime. 420 mL 0   amitriptyline (ELAVIL) 10 MG tablet Take 1 tablet (10 mg total) by mouth at bedtime. 30 tablet 1   isosorbide mononitrate (IMDUR) 30 MG 24 hr tablet TAKE 0.5 TABLETS (15 MG TOTAL) BY MOUTH DAILY. 45 tablet 3   No facility-administered medications prior to visit.     ROS Review of Systems  Constitutional:  Negative for activity change,  appetite change and fatigue.  HENT:  Negative for congestion, sinus pressure and sore throat.   Eyes:  Negative for visual disturbance.  Respiratory:  Negative for cough, chest tightness, shortness of breath and wheezing.   Cardiovascular:  Negative for chest pain and palpitations.  Gastrointestinal:  Positive for nausea and vomiting. Negative for abdominal distention, abdominal pain and constipation.  Endocrine: Negative for polydipsia.  Genitourinary:  Negative for dysuria and frequency.  Musculoskeletal:  Positive for gait problem. Negative for arthralgias and back pain.       Left hip pain  Skin:  Negative for rash.  Neurological:  Negative for tremors, light-headedness and numbness.  Hematological:  Does not bruise/bleed easily.  Psychiatric/Behavioral:  Negative for agitation and behavioral problems.     Objective:  BP (!) 152/60 (BP Location: Right Arm, Patient Position: Sitting, Cuff Size: Normal)   Pulse 87   Temp 98 F (36.7 C) (Oral)   Ht '5\' 5"'$  (1.651 m)   Wt 136 lb 9.6 oz (62 kg)   SpO2 96%   BMI 22.73 kg/m      07/30/2021   10:34 AM 07/21/2021   10:52 AM 07/20/2021    1:11 PM  BP/Weight  Systolic BP 536 644 034  Diastolic BP 60 73 63  Wt. (Lbs) 136.6 138.8   BMI 22.73 kg/m2 23.1 kg/m2       Physical Exam Constitutional:      Appearance: She is well-developed.  Cardiovascular:     Rate and Rhythm: Normal rate.     Heart sounds: Normal heart sounds. No murmur heard. Pulmonary:  Effort: Pulmonary effort is normal.     Breath sounds: Normal breath sounds. No wheezing or rales.  Chest:     Chest wall: No tenderness.  Abdominal:     General: Bowel sounds are normal. There is no distension.     Palpations: Abdomen is soft. There is no mass.     Tenderness: There is no abdominal tenderness.  Musculoskeletal:     Right lower leg: No edema.     Left lower leg: No edema.     Comments: Left arm AV fistula Left hip mass which is tender.  Neurological:      Mental Status: She is alert and oriented to person, place, and time.  Psychiatric:        Mood and Affect: Mood normal.        Latest Ref Rng & Units 07/01/2020    8:32 AM 06/29/2020    1:10 PM 03/22/2020   11:35 AM  CMP  Glucose 70 - 99 mg/dL 81  97  105   BUN 6 - 20 mg/dL 46  26  24   Creatinine 0.44 - 1.00 mg/dL 8.62  6.09  8.18   Sodium 135 - 145 mmol/L 139  138  137   Potassium 3.5 - 5.1 mmol/L 4.0  3.7  4.0   Chloride 98 - 111 mmol/L 94  93  94   CO2 22 - 32 mmol/L '26  31  27   '$ Calcium 8.9 - 10.3 mg/dL 9.2  9.6  10.2   Total Protein 6.5 - 8.1 g/dL  7.1  8.1   Total Bilirubin 0.3 - 1.2 mg/dL  1.0  1.2   Alkaline Phos 38 - 126 U/L  101  79   AST 15 - 41 U/L  21  16   ALT 0 - 44 U/L  24  12     Lipid Panel     Component Value Date/Time   CHOL 152 01/19/2019 1448   TRIG 149 01/19/2019 1448   HDL 36 (L) 01/19/2019 1448   CHOLHDL 4.2 01/19/2019 1448   CHOLHDL 7.2 Ratio 01/01/2010 2055   VLDL 30 01/01/2010 2055   LDLCALC 90 01/19/2019 1448    CBC    Component Value Date/Time   WBC 5.9 07/01/2020 0832   RBC 4.07 07/01/2020 0832   HGB 9.7 (L) 07/01/2020 0832   HGB 9.2 (L) 07/26/2016 0000   HCT 32.2 (L) 07/01/2020 0832   HCT 30.7 (L) 07/26/2016 0000   PLT 224 07/01/2020 0832   PLT 474 (H) 07/26/2016 0000   MCV 79.1 (L) 07/01/2020 0832   MCV 82 07/26/2016 0000   MCH 23.8 (L) 07/01/2020 0832   MCHC 30.1 07/01/2020 0832   RDW 19.4 (H) 07/01/2020 0832   RDW 13.6 07/26/2016 0000   LYMPHSABS 1.1 06/29/2020 1310   LYMPHSABS 2.9 07/26/2016 0000   MONOABS 0.4 06/29/2020 1310   EOSABS 0.1 06/29/2020 1310   EOSABS 0.1 07/26/2016 0000   BASOSABS 0.0 06/29/2020 1310   BASOSABS 0.0 07/26/2016 0000    Lab Results  Component Value Date   HGBA1C 5.3 07/30/2021    Assessment & Plan:  1. Type 2 diabetes mellitus with other specified complication, without long-term current use of insulin (HCC) Diet controlled with A1c of 5.3 Advised to schedule annual eye exam Counseled  on Diabetic diet, my plate method, 951 minutes of moderate intensity exercise/week Blood sugar logs with fasting goals of 80-120 mg/dl, random of less than 180 and in the event of  sugars less than 60 mg/dl or greater than 400 mg/dl encouraged to notify the clinic. Advised on the need for annual eye exams, annual foot exams, Pneumonia vaccine. - POCT glycosylated hemoglobin (Hb A1C)  2. Vasomotor symptoms due to menopause We have discussed hormonal and nonhormonal options Given history of cardiac disease I will hold off on hormonal therapy Trial of Paxil - PARoxetine (PAXIL) 20 MG tablet; Take 1 tablet (20 mg total) by mouth daily. For hot flashes  Dispense: 30 tablet; Refill: 3  3. Gastroparesis Uncontrolled Status post Botox injection of pylorus in the past Zofran administered in clinic She will follow-up with GI at next week - promethazine (PHENERGAN) 25 MG tablet; Take 1 tablet (25 mg total) by mouth every 8 (eight) hours as needed for nausea or vomiting.  Dispense: 30 tablet; Refill: 1  4. Hypertension associated with stage 5 chronic kidney disease due to type 2 diabetes mellitus ([place]) Above goal Due to the fact that she is on hemodialysis we will allow for permissive hypertension to prevent hypotension  5. Tumoral calcinosis Ongoing pain I had ordered a Hoveround for her Completed paperwork today    Meds ordered this encounter  Medications   PARoxetine (PAXIL) 20 MG tablet    Sig: Take 1 tablet (20 mg total) by mouth daily. For hot flashes    Dispense:  30 tablet    Refill:  3   promethazine (PHENERGAN) 25 MG tablet    Sig: Take 1 tablet (25 mg total) by mouth every 8 (eight) hours as needed for nausea or vomiting.    Dispense:  30 tablet    Refill:  1    Follow-up: Return in about 6 months (around 01/30/2022).       Charlott Rakes, MD, FAAFP. [facility] [place], [place]   07/30/2021, 12:09 PM

## 2021-07-31 DIAGNOSIS — D631 Anemia in chronic kidney disease: Secondary | ICD-10-CM | POA: Diagnosis not present

## 2021-07-31 DIAGNOSIS — N2581 Secondary hyperparathyroidism of renal origin: Secondary | ICD-10-CM | POA: Diagnosis not present

## 2021-07-31 DIAGNOSIS — L299 Pruritus, unspecified: Secondary | ICD-10-CM | POA: Diagnosis not present

## 2021-07-31 DIAGNOSIS — D509 Iron deficiency anemia, unspecified: Secondary | ICD-10-CM | POA: Diagnosis not present

## 2021-07-31 DIAGNOSIS — Z992 Dependence on renal dialysis: Secondary | ICD-10-CM | POA: Diagnosis not present

## 2021-07-31 DIAGNOSIS — N186 End stage renal disease: Secondary | ICD-10-CM | POA: Diagnosis not present

## 2021-08-03 DIAGNOSIS — N2581 Secondary hyperparathyroidism of renal origin: Secondary | ICD-10-CM | POA: Diagnosis not present

## 2021-08-03 DIAGNOSIS — D631 Anemia in chronic kidney disease: Secondary | ICD-10-CM | POA: Diagnosis not present

## 2021-08-03 DIAGNOSIS — D509 Iron deficiency anemia, unspecified: Secondary | ICD-10-CM | POA: Diagnosis not present

## 2021-08-03 DIAGNOSIS — L299 Pruritus, unspecified: Secondary | ICD-10-CM | POA: Diagnosis not present

## 2021-08-03 DIAGNOSIS — N186 End stage renal disease: Secondary | ICD-10-CM | POA: Diagnosis not present

## 2021-08-03 DIAGNOSIS — Z992 Dependence on renal dialysis: Secondary | ICD-10-CM | POA: Diagnosis not present

## 2021-08-03 NOTE — Telephone Encounter (Signed)
Incoming paperwork

## 2021-08-03 NOTE — Telephone Encounter (Signed)
Pt called and stated that medicare kicked form for chair back because form was initialed and needs a full signature and date. Form was faxed back over to office. Please advise

## 2021-08-04 ENCOUNTER — Encounter: Payer: Self-pay | Admitting: Vascular Surgery

## 2021-08-04 ENCOUNTER — Ambulatory Visit (HOSPITAL_COMMUNITY)
Admission: RE | Admit: 2021-08-04 | Discharge: 2021-08-04 | Disposition: A | Discharge status: Home / Self Care | Payer: Medicare Other | Source: Ambulatory Visit | Attending: Vascular Surgery | Admitting: Vascular Surgery

## 2021-08-04 ENCOUNTER — Ambulatory Visit (INDEPENDENT_AMBULATORY_CARE_PROVIDER_SITE_OTHER)
Admission: RE | Admit: 2021-08-04 | Discharge: 2021-08-04 | Disposition: A | Discharge status: Home / Self Care | Payer: Medicare Other | Source: Ambulatory Visit | Attending: Vascular Surgery | Admitting: Vascular Surgery

## 2021-08-04 ENCOUNTER — Ambulatory Visit (INDEPENDENT_AMBULATORY_CARE_PROVIDER_SITE_OTHER): Payer: Medicare Other | Admitting: Vascular Surgery

## 2021-08-04 VITALS — BP 157/72 | HR 136 | Temp 98.0°F | Resp 14 | Ht 65.0 in | Wt 136.0 lb

## 2021-08-04 DIAGNOSIS — I739 Peripheral vascular disease, unspecified: Secondary | ICD-10-CM | POA: Diagnosis not present

## 2021-08-04 DIAGNOSIS — Z95828 Presence of other vascular implants and grafts: Secondary | ICD-10-CM | POA: Insufficient documentation

## 2021-08-04 NOTE — Progress Notes (Signed)
Patient name: Susan Horton MRN: [id number] DOB: 1970/12/24 Sex: female  REASON FOR VISIT: 20-monthfollow-up for surveillance of PAD  HPI: TFARRON LAFONDis a 51y.o. female with multiple medical problems as listed below including DM and ESRD that presents for 3 month follow-up of her PAD.  Her main complaint today is left hip pain.  She has been evaluated by Dr. DSharol Givenwith orthopedic surgery and has tumoral calcinosis with persistent pain of the left hip.  He has recommended a trial of medical management for now and she has follow-up with him tomorrow.  She feels her legs are doing okay when she walks and not having any claudication symptoms like she had in the past.  Her vascular history includes left SFA stenting on 12/14/2018, right SFA stenting with left external iliac artery stenting on 04/25/2019, with an additional left external iliac stenting on 07/18/2019 all for lifestyle limiting claudication.  Past Medical History:  Diagnosis Date   Abnormal stress test    Anemia    Angio-edema    Asthma    CAD (coronary artery disease)    DES to mid LAD July 2018, residual moderate RCA disease   Cataract    CKD (chronic kidney disease) stage 4, GFR 15-29 ml/min (HCC)    Dialysis T/Th/Sa   DV[place] venous thrombosis) ([place]    [place] during pregnancy, 2015 left leg   Eczema    Essential hypertension 12/11/2015   Gastroparesis    GERD (gastroesophageal reflux disease)    Gluteal abscess 12/27/2018   Hidradenitis    Migraine    Neuropathy    Peripheral vascular disease (HCC)    blood clot in leg   Progressive angina ([place] 06/05/2014   Chest pain   Type 2 diabetes mellitus ([place]    Type II diabetes mellitus ([place]    Urticaria     Past Surgical History:  Procedure Laterality Date   A/V FISTULAGRAM N/A 06/22/2017   Procedure: A/V FISTULAGRAM - left arm;  Surgeon: CWaynetta Sandy MD;  [facility];  Service: Cardiovascular;  Laterality: N/A;   ABDOMINAL AORTOGRAM  W/LOWER EXTREMITY Bilateral 12/14/2018   Procedure: ABDOMINAL AORTOGRAM W/LOWER EXTREMITY;  Surgeon: CMarty Heck MD;  [facility];  Service: Cardiovascular;  Laterality: Bilateral;   ABDOMINAL AORTOGRAM W/LOWER EXTREMITY N/A 04/25/2019   Procedure: ABDOMINAL AORTOGRAM W/LOWER EXTREMITY;  Surgeon: CMarty Heck MD;  [facility];  Service: Cardiovascular;  Laterality: N/A;   ABDOMINAL AORTOGRAM W/LOWER EXTREMITY Left 07/18/2019   Procedure: ABDOMINAL AORTOGRAM W/LOWER EXTREMITY;  Surgeon: CMarty Heck MD;  [facility];  Service: Cardiovascular;  Laterality: Left;   ABDOMINOPLASTY     ADENOIDECTOMY     APPENDECTOMY  1995   AV FISTULA PLACEMENT Left 02/01/2017   Procedure: ARTERIOVENOUS BRACHIOCEPHALIC (AV) FISTULA CREATION;  Surgeon: FElam Dutch MD;  [facility];  Service: Vascular;  Laterality: Left;   [place]VENT[place] 08/05/2016   Procedure: Coronary Stent Intervention;  Surgeon: BLorretta Harp MD;  [facility];  Service: Cardiovascular;  Laterality: N/A;   ESOPHAGOGASTRODUODENOSCOPY (EGD) WITH PROPOFOL N/A 07/01/2020   Procedure: ESOPHAGOGASTRODUODENOSCOPY (EGD) WITH PROPOFOL;  Surgeon: GGatha Mayer MD;  [facility]  Service: Endoscopy;  Laterality: N/A;   EXPLORATORY LAPAROTOMY  08/14/2005   lysis of adhesions, drainage of tubo-ovarian abscess  FISTULA SUPERFICIALIZATION Left 09/14/2017   Procedure: FISTULA SUPERFICIALIZATION ARTERIOVENOUS FISTULA LEFT ARM;  Surgeon: [name], MD;  Location: [place];  Service: Vascular;  Laterality: Left;   FLEXIBLE SIGMOIDOSCOPY N/A 01/04/2019   Procedure: FLEXIBLE SIGMOIDOSCOPY;  Surgeon: Clarene Essex, MD;  Location: [place];  Service: Endoscopy;  Laterality: N/A;   HYDRADENITIS EXCISION  02/08/2011   Procedure: EXCISION HYDRADENITIS GROIN;   Surgeon: [name], MD;  Location: [place];  Service: General;  Laterality: N/A;  Excisioin of Hidradenitis Left groin   INCISION AND DRAINAGE ABSCESS N/A 01/11/2019   Procedure: INCISION AND DRAINAGE SACRAL ABSCESS;  Surgeon: Georganna Skeans, MD;  Location: [place];  Service: General;  Laterality: N/A;   INGUINAL HIDRADENITIS EXCISION  07/06/2010   bilateral   INSERTION OF DIALYSIS CATHETER N/A 09/14/2017   Procedure: INSERTION OF TUNNELED DIALYSIS CATHETER Right Internal Jugular;  Surgeon: [name], MD;  Location: [place];  Service: Vascular;  Laterality: N/A;   IR FLUORO GUIDE CV LINE RIGHT  03/01/2019   IR US GUIDE BX ASP/DRAIN  01/01/2019   IR US GUIDE VASC ACCESS RIGHT  03/01/2019   LEFT HEART CATH AND CORONARY ANGIOGRAPHY N/A 08/05/2016   Procedure: Left Heart Cath and Coronary Angiography;  Surgeon: [name], MD;  Location: [facility];  Service: Cardiovascular;  Laterality: N/A;   PERIPHERAL VASCULAR INTERVENTION Left 12/14/2018   Procedure: PERIPHERAL VASCULAR INTERVENTION;  Surgeon: [name], MD;  Location: [facility];  Service: Cardiovascular;  Laterality: Left;  SFA   PERIPHERAL VASCULAR INTERVENTION Left 04/25/2019   Procedure: PERIPHERAL VASCULAR INTERVENTION;  Surgeon: [name], MD;  Location: [facility] [facility];  Service: Cardiovascular;  Laterality: Left;  right superficial femoral, and left iliac   PERIPHERAL VASCULAR INTERVENTION Left 07/18/2019   Procedure: PERIPHERAL VASCULAR INTERVENTION;  Surgeon: [name], MD;  Location: [facility];  Service: Cardiovascular;  Laterality: Left;  external iliac   REDUCTION MAMMAPLASTY  2002   TONSILLECTOMY     TUBAL LIGATION  1996   VAGINAL HYSTERECTOMY  08/04/2005   and cysto    Family History  Problem Relation Age of Onset   Allergic rhinitis Mother    Hypertension Father    Allergic rhinitis Father    Hypertension Sister    Allergic  rhinitis Sister    Hypertension Brother    Allergic rhinitis Brother    Breast cancer Cousin        x2    SOCIAL HISTORY: Social History   Tobacco Use   Smoking status: Former    Years: 20.00    Types: Cigarettes    Quit date: 11/2016    Years since quitting: 4.7   Smokeless tobacco: Never  Substance Use Topics   Alcohol use: No    Alcohol/week: 0.0 standard drinks of alcohol    Allergies  Allergen Reactions   Penicillins Anaphylaxis, Itching, Swelling, Rash and Other (See Comments)    Swelling of throat & whole mouth  Has patient had a PCN reaction causing immediate rash, facial/tongue/throat swelling, SOB or lightheadedness with hypotension: Yes Has patient had a PCN reaction causing severe rash involving mucus membranes or skin necrosis: No Has patient had a PCN reaction that required hospitalization: No Has patient had a PCN reaction occurring within the last 10 years: No If all of the above answers are "NO", then may proceed with Cephalosporin use.   Repatha [Evolocumab] Itching   Lisinopril Cough  Current Outpatient Medications  Medication Sig Dispense Refill   acetaminophen-codeine (TYLENOL #3) 300-30 MG tablet Take 1 tablet by mouth at bedtime as needed for moderate pain. 30 tablet 0   albuterol (VENTOLIN HFA) 108 (90 Base) MCG/ACT inhaler Inhale 1-2 puffs into the lungs every 6 (six) hours as needed for wheezing or shortness of breath. 18 g 2   amLODipine (NORVASC) 10 MG tablet TAKE 1 TABLET BY MOUTH EVERY DAY 90 tablet 3   aspirin 81 MG chewable tablet Chew 1 tablet (81 mg total) by mouth daily. 30 tablet 0   atorvastatin (LIPITOR) 40 MG tablet TAKE 1 TABLET BY MOUTH EVERY DAY 90 tablet 1   B Complex-C-Zn-Folic Acid (DIALYVITE/ZINC) TABS Take 1 tablet by mouth daily.     calcitRIOL (ROCALTROL) 0.5 MCG capsule Take 1.5 mcg by mouth daily.     calcium acetate (PHOSLO) 667 MG capsule Take 667 mg by mouth 3 (three) times daily with meals.  10   clopidogrel  (PLAVIX) 75 MG tablet TAKE 1 TABLET (75 MG TOTAL) BY MOUTH DAILY WITH BREAKFAST. 90 tablet 3   cyclobenzaprine (FLEXERIL) 10 MG tablet Take 1 tablet (10 mg total) by mouth 2 (two) times daily as needed. 60 tablet 1   Fluticasone-Salmeterol (ADVAIR) 250-50 MCG/DOSE AEPB Inhale 1 puff into the lungs 2 (two) times daily. 60 each 3   HYDROcodone-acetaminophen (NORCO) 10-325 MG tablet Take 0.5 tablets by mouth every 8 (eight) hours as needed for severe pain. 8 tablet 0   ipratropium-albuterol (DUONEB) 0.5-2.5 (3) MG/3ML SOLN Take 3 mLs by nebulization every 6 (six) hours as needed. 360 mL 2   iron sucrose (VENOFER) 20 MG/ML injection Iron Sucrose (Venofer)     Methoxy PEG-Epoetin Beta (MIRCERA IJ) Inject into the skin.     metoprolol succinate (TOPROL-XL) 50 MG 24 hr tablet TAKE 1.5 TABLETS BY MOUTH DAILY 135 tablet 3   Misc. Devices MISC Electric scooter for mobility 1 each 0   montelukast (SINGULAIR) 10 MG tablet Take 1 tablet (10 mg total) by mouth at bedtime. 30 tablet 5   ondansetron (ZOFRAN ODT) 4 MG disintegrating tablet Take 1 tablet (4 mg total) by mouth every 8 (eight) hours as needed for nausea or vomiting. 20 tablet 0   oxyCODONE-acetaminophen (PERCOCET/ROXICET) 5-325 MG tablet Take 1 tablet by mouth every 8 (eight) hours as needed for severe pain. 10 tablet 0   pantoprazole (PROTONIX) 40 MG tablet Take 1 tablet (40 mg total) by mouth daily. 30 tablet 0   PARoxetine (PAXIL) 20 MG tablet Take 1 tablet (20 mg total) by mouth daily. For hot flashes 30 tablet 3   PRALUENT 75 MG/ML SOAJ INJECT 75 MG INTO THE SKIN EVERY 14 (FOURTEEN) DAYS. 2 mL 11   promethazine (PHENERGAN) 25 MG tablet Take 1 tablet (25 mg total) by mouth every 8 (eight) hours as needed for nausea or vomiting. 30 tablet 1   sucralfate (CARAFATE) 1 GM/10ML suspension Take 10 mLs (1 g total) by mouth 4 (four) times daily -  with meals and at bedtime. 420 mL 0   hydrALAZINE (APRESOLINE) 50 MG tablet Take 50 mg by mouth every 8  (eight) hours. (Patient not taking: Reported on 08/04/2021)     isosorbide mononitrate (IMDUR) 30 MG 24 hr tablet TAKE 0.5 TABLETS (15 MG TOTAL) BY MOUTH DAILY. 45 tablet 3   nitroGLYCERIN (NITROSTAT) 0.4 MG SL tablet Place 1 tablet (0.4 mg total) under the tongue every 5 (five) minutes as needed for chest pain. (Patient  not taking: Reported on 08/04/2021) 25 tablet 2   No current facility-administered medications for this visit.    REVIEW OF SYSTEMS:  '[X]'$  denotes positive finding, '[ ]'$  denotes negative finding Cardiac  Comments:  Chest pain or chest pressure:    Shortness of breath upon exertion:    Short of breath when lying flat:    Irregular heart rhythm:        Vascular    Pain in calf, thigh, or hip brought on by ambulation:    Pain in feet at night that wakes you up from your sleep:     Blood clot in your veins:    Leg swelling:         Pulmonary    Oxygen at home:    Productive cough:     Wheezing:         Neurologic    Sudden weakness in arms or legs:     Sudden numbness in arms or legs:     Sudden onset of difficulty speaking or slurred speech:    Temporary loss of vision in one eye:     Problems with dizziness:         Gastrointestinal    Blood in stool:     Vomited blood:         Genitourinary    Burning when urinating:     Blood in urine:        Psychiatric    Major depression:         Hematologic    Bleeding problems:    Problems with blood clotting too easily:        Skin    Rashes or ulcers:        Constitutional    Fever or chills:      PHYSICAL EXAM: Vitals:   08/04/21 0836  BP: (!) 157/72  Pulse: (!) 136  Resp: 14  Temp: 98 F (36.7 C)  TempSrc: Temporal  Weight: 136 lb (61.7 kg)  Height: '5\' 5"'$  (1.651 m)    GENERAL: The patient is a well-nourished female, in no acute distress. The vital signs are documented above. CARDIAC: There is a regular rate and rhythm.  VASCULAR:  Bilateral femoral pulses palpable No palpable pedal  pulses No lower extremity tissue loss PULMONARY: No respiratory distress. ABDOMEN: Soft and non-tender. MUSCULOSKELETAL: There are no major deformities or cyanosis. NEUROLOGIC: No focal weakness or paresthesias are detected.  DATA:   Aortoiliac duplex today shows evidence of recurrent stenosis in the right common iliac and external iliac artery with velocities of 247 and 227, respectively and stable over past 3 months.  She also has evidence of stenosis in the left common iliac artery with a velocity of 342 (previously 259) and distal left external iliac artery velocity of 222 (previouisly 261).    Assessment/Plan:  51 year old female with multiple medical issues including end-stage renal disease that presents for interval 20-monthfollow-up of her PAD.  She has had multiple iliac as well as bilateral SFA interventions for lifestyle limiting claudication.  She has evidence of recurrent stenosis of her iliacs arteries bilaterally as noted above.  We have been following this with close interval surveillance.  Fortunately she still has no recurrent lower extremity claudication symptoms like she had in the past when intervention was previously performed.  All of her complaints today again are related to her left hip tumoral calcinosis.  She has follow-up with Dr. DSharol Giventomorrow.  I have recommended that we continue close  interval surveillance given she really does not have any symptoms at this time and most of all of her problems seem to be related to her hip the Dr. Sharol Given is treating.  Certainly if her velocities continue to increase we can pursue additional intervention.  I will see her in 3 months with non-invasive imaging.    [name], MD [facility] Office: [phone]

## 2021-08-05 ENCOUNTER — Encounter: Payer: Self-pay | Admitting: Family

## 2021-08-05 ENCOUNTER — Ambulatory Visit (INDEPENDENT_AMBULATORY_CARE_PROVIDER_SITE_OTHER): Payer: Medicare Other | Admitting: Family

## 2021-08-05 DIAGNOSIS — N2581 Secondary hyperparathyroidism of renal origin: Secondary | ICD-10-CM | POA: Diagnosis not present

## 2021-08-05 DIAGNOSIS — Z992 Dependence on renal dialysis: Secondary | ICD-10-CM | POA: Diagnosis not present

## 2021-08-05 DIAGNOSIS — G894 Chronic pain syndrome: Secondary | ICD-10-CM

## 2021-08-05 DIAGNOSIS — L299 Pruritus, unspecified: Secondary | ICD-10-CM | POA: Diagnosis not present

## 2021-08-05 DIAGNOSIS — D631 Anemia in chronic kidney disease: Secondary | ICD-10-CM | POA: Diagnosis not present

## 2021-08-05 DIAGNOSIS — D509 Iron deficiency anemia, unspecified: Secondary | ICD-10-CM | POA: Diagnosis not present

## 2021-08-05 DIAGNOSIS — N186 End stage renal disease: Secondary | ICD-10-CM | POA: Diagnosis not present

## 2021-08-05 MED ORDER — OXYCODONE-ACETAMINOPHEN 5-325 MG PO TABS: 1.0000 | ORAL_TABLET | Freq: Three times a day (TID) | ORAL | 0 refills | Status: DC | PRN

## 2021-08-05 NOTE — Progress Notes (Signed)
Ref   Office Visit Note   Patient: Susan Horton           Date of Birth: [birth year]           MRN: [id number] Visit Date: 08/05/2021              Requested by: Charlott Rakes, MD [place],  [place] [address] PCP: Charlott Rakes, MD  Chief Complaint  Patient presents with   Left Hip - Pain      HPI: The patient is a 51 year old woman seen today in follow-up for tumoral calcinosis related left hip pain.  She has had recent emergency department visits for the same most recent radiographs performed on July 10th show increase in size.  The ossification is now 20.8 x 14 cm.  Was previously measuring 13.8 x 11.3 cm.  She has been having increasing pain.  With numbness tingling burning shooting pain difficulty with weightbearing difficulty sleeping.  States has difficulty completing a course of dialysis due to pain sitting she is ambulating with a straight cane. she has used Tylenol 3 which she reports was not providing great relief she is also previously used Percocet.  At last visit was prescribed Norco by the emergency department she appears to have not filled this prescription  States that she is in physical therapy for quad strengthening on the left.    Assessment & Plan: Visit Diagnoses:  1. Tumoral calcinosis   2. Chronic pain syndrome     Plan: Will provide a referral to pain management.  Provided a refill of her Percocet.  We will also refer to [facility] for orthopedic oncology to have a look consider surgical excision.  Follow-Up Instructions: No follow-ups on file.   Ortho Exam  Patient is alert, oriented, no adenopathy, well-dressed, normal affect, normal respiratory effort. On examination of the left lateral hip there is a large mass to anterior lateral thigh.  There is no skin breakdown no color changes no drainage no cellulitis no warmth. tenderness to palpation  Imaging: No results found. No images are attached to the  encounter.  Labs: Lab Results  Component Value Date   HGBA1C 5.3 07/30/2021   HGBA1C 4.9 06/30/2020   HGBA1C 7.5 (H) 12/26/2018   ESRSEDRATE 132 (H) 01/12/2019   CRP 2.9 (H) 02/25/2019   CRP 22.8 (H) 01/12/2019   REPTSTATUS 03/06/2019 FINAL 03/01/2019   REPTSTATUS 03/02/2019 FINAL 03/01/2019   GRAMSTAIN  03/01/2019    WBC PRESENT,BOTH PMN AND MONONUCLEAR NO ORGANISMS SEEN CYTOSPIN SMEAR Performed at [facility], [place] [address] [place], [place] [address]    CULT  03/01/2019    NO GROWTH 5 DAYS Performed at [facility] [address] [place],  [address]    LABORGA ESCHERICHIA COLI 03/09/2008     Lab Results  Component Value Date   ALBUMIN 3.3 (L) 06/29/2020   ALBUMIN 3.8 03/22/2020   ALBUMIN 1.9 (L) 03/02/2019    Lab Results  Component Value Date   MG 2.1 07/01/2020   No results found for: "VD25OH"  No results found for: "PREALBUMIN"    Latest Ref Rng & Units 07/01/2020    8:32 AM 06/29/2020    1:10 PM 03/22/2020   11:35 AM  CBC EXTENDED  WBC 4.0 - 10.5 K/uL 5.9  5.5  8.1   RBC 3.87 - 5.11 MIL/uL 4.07  4.42  4.71   Hemoglobin 12.0 - 15.0 g/dL 9.7  10.6  12.3   HCT 36.0 - 46.0 % 32.2  36.2  39.7   Platelets 150 - 400 K/uL 224  212  327   NEUT# 1.7 - 7.7 K/uL  3.9    Lymph# 0.7 - 4.0 K/uL  1.1       There is no height or weight on file to calculate BMI.  Orders:  Orders Placed This Encounter  Procedures   Ambulatory referral to Pain Clinic   Ambulatory referral to Orthopedic Surgery   Meds ordered this encounter  Medications   oxyCODONE-acetaminophen (PERCOCET/ROXICET) 5-325 MG tablet    Sig: Take 1 tablet by mouth every 8 (eight) hours as needed for severe pain.    Dispense:  21 tablet    Refill:  0     Procedures: No procedures performed  Clinical Data: No additional findings.  ROS:  All other systems negative, except as noted in the HPI. Review of Systems  Objective: Vital Signs: There were no vitals taken for  this visit.  Specialty Comments:  No specialty comments available.  PMFS History: Patient Active Problem List   Diagnosis Date Noted   Diabetes mellitus ([place]) 01/29/2021   Hypertension associated with stage 5 chronic kidney disease due to type 2 diabetes mellitus ([place]) 01/29/2021   Tumoral calcinosis 01/29/2021   Dysphagia    Nausea 09/12/2019   Diarrhea, unspecified 09/12/2019   Pruritus, unspecified 08/13/2019   Anaphylactic shock, unspecified, sequela 08/13/2019   Allergy, unspecified, sequela 08/13/2019   Unspecified cataract 03/19/2019   Unspecified asthma with (acute) exacerbation 03/19/2019   COVID-19 03/06/2019   Intractable nausea and vomiting 02/24/2019   Abdominal pain, epigastric 02/24/2019   Multifocal pneumonia 02/24/2019   Shortness of breath 01/19/2019   Cerebrovascular small vessel disease    Generalized weakness    Abscess of sacrum (HCC)    Gluteal abscess 12/25/2018   Moderate protein-calorie malnutrition ([place]) 12/19/2018   Other disorders resulting from impaired renal tubular function 12/19/2018   Liver disease, unspecified 12/19/2018   Hyperkalemia 12/19/2018   Acidosis 12/19/2018   Hyperlipidemia 11/10/2018   History of arteriosclerotic vascular disease 11/10/2018   Carotid stenosis 11/10/2018   Symptomatic anemia 09/16/2018   Peripheral vascular disease, unspecified ([place]) 09/04/2018   Other disorders of bilirubin metabolism 01/20/2018   Other long term (current) drug therapy 11/28/2017   Other abnormal findings in urine 11/28/2017   Dependence on renal dialysis ([place]) 11/28/2017   ESRD on peritoneal dialysis ([place]) 56/38/7564   Complication of vascular dialysis catheter 09/15/2017   History of anemia due to CKD 07/18/2017   DVT (deep vein thrombosis) in pregnancy 07/18/2017   Encounter for removal of sutures 07/07/2017   Iron deficiency anemia, unspecified 06/30/2017   Secondary hyperparathyroidism of renal origin ([place]) 06/24/2017   Pain,  unspecified 06/24/2017   Fever, unspecified 06/24/2017   End stage renal disease ([place]) 06/24/2017   Coagulation defect, unspecified ([place]) 06/24/2017   Anemia in chronic kidney disease 06/24/2017   Chronic diastolic CHF (congestive heart failure) ([place]) 06/18/2017   Moderate persistent asthma 06/10/2017   Adjustment disorder with depressed mood 09/08/2016   CKD (chronic kidney disease) stage 4, GFR 15-29 ml/min (HCC)    CAD S/P mLAD PCI with DES 08/06/2016   Presence of drug coated stent in LAD coronary artery 08/06/2016   Atherosclerotic heart disease of native coronary artery without angina pectoris 07/11/2016   Carotid bruit present 06/15/2016   Dyslipidemia 06/15/2016   Smoker 06/15/2016   Neuropathy 03/10/2016   Essential hypertension, benign 12/11/2015  Left facial pain 03/04/2015   Abnormal sense of taste 03/04/2015   Gastroparesis    Uncontrolled type 2 diabetes mellitus with peripheral neuropathy 06/05/2014   Yeast infection of the vagina 07/10/2010   Past Medical History:  Diagnosis Date   Abnormal stress test    Anemia    Angio-edema    Asthma    CAD (coronary artery disease)    DES to mid LAD July 2018, residual moderate RCA disease   Cataract    CKD (chronic kidney disease) stage 4, GFR 15-29 ml/min (HCC)    Dialysis T/Th/Sa   DVT (deep venous thrombosis) ([place])    1996 during pregnancy, 2015 left leg   Eczema    Essential hypertension 12/11/2015   Gastroparesis    GERD (gastroesophageal reflux disease)    Gluteal abscess 12/27/2018   Hidradenitis    Migraine    Neuropathy    Peripheral vascular disease (HCC)    blood clot in leg   Progressive angina (HCC) 06/05/2014   Chest pain   Type 2 diabetes mellitus ([place])    Type II diabetes mellitus ([place])    Urticaria     Family History  Problem Relation Age of Onset   Allergic rhinitis Mother    Hypertension Father    Allergic rhinitis Father    Hypertension Sister    Allergic rhinitis Sister     Hypertension Brother    Allergic rhinitis Brother    Breast cancer Cousin        x2    Past Surgical History:  Procedure Laterality Date   A/V FISTULAGRAM N/A 06/22/2017   Procedure: A/V FISTULAGRAM - left arm;  Surgeon: [name], MD;  Location: [facility];  Service: Cardiovascular;  Laterality: N/A;   ABDOMINAL AORTOGRAM W/LOWER EXTREMITY Bilateral 12/14/2018   Procedure: ABDOMINAL AORTOGRAM W/LOWER EXTREMITY;  Surgeon: [name], MD;  Location: [facility];  Service: Cardiovascular;  Laterality: Bilateral;   ABDOMINAL AORTOGRAM W/LOWER EXTREMITY N/A 04/25/2019   Procedure: ABDOMINAL AORTOGRAM W/LOWER EXTREMITY;  Surgeon: [name], MD;  Location: [facility];  Service: Cardiovascular;  Laterality: N/A;   ABDOMINAL AORTOGRAM W/LOWER EXTREMITY Left 07/18/2019   Procedure: ABDOMINAL AORTOGRAM W/LOWER EXTREMITY;  Surgeon: [name], MD;  Location: [facility];  Service: Cardiovascular;  Laterality: Left;   ABDOMINOPLASTY     ADENOIDECTOMY     APPENDECTOMY  1995   AV FISTULA PLACEMENT Left 02/01/2017   Procedure: ARTERIOVENOUS BRACHIOCEPHALIC (AV) FISTULA CREATION;  Surgeon: [name], MD;  Location: [facility];  Service: Vascular;  Laterality: Left;   [place] INTERVENTION N/A 08/05/2016   Procedure: Coronary Stent Intervention;  Surgeon: [name], MD;  Location: [facility];  Service: Cardiovascular;  Laterality: N/A;   ESOPHAGOGASTRODUODENOSCOPY (EGD) WITH PROPOFOL N/A 07/01/2020   Procedure: ESOPHAGOGASTRODUODENOSCOPY (EGD) WITH PROPOFOL;  Surgeon: Gatha Mayer, MD;  Location: [facility];  Service: Endoscopy;  Laterality: N/A;   EXPLORATORY LAPAROTOMY  08/14/2005   lysis of adhesions, drainage of tubo-ovarian abscess   FISTULA SUPERFICIALIZATION Left 09/14/2017   Procedure: FISTULA SUPERFICIALIZATION ARTERIOVENOUS FISTULA  LEFT ARM;  Surgeon: [name], MD;  Location: [facility];  Service: Vascular;  Laterality: Left;   FLEXIBLE SIGMOIDOSCOPY N/A 01/04/2019   Procedure: FLEXIBLE SIGMOIDOSCOPY;  Surgeon: Clarene Essex, MD;  Location: [facility];  Service: Endoscopy;  Laterality: N/A;   HYDRADENITIS EXCISION  02/08/2011   Procedure: EXCISION HYDRADENITIS GROIN;  Surgeon: [name], MD;  Location: [facility];  Service: General;  Laterality: N/A;  Excisioin of Hidradenitis Left groin   INCISION AND DRAINAGE ABSCESS N/A 01/11/2019   Procedure: INCISION AND DRAINAGE SACRAL ABSCESS;  Surgeon: Georganna Skeans, MD;  Location: [facility];  Service: General;  Laterality: N/A;   INGUINAL HIDRADENITIS EXCISION  07/06/2010   bilateral   INSERTION OF DIALYSIS CATHETER N/A 09/14/2017   Procedure: INSERTION OF TUNNELED DIALYSIS CATHETER Right Internal Jugular;  Surgeon: [name], MD;  Location: [facility];  Service: Vascular;  Laterality: N/A;   IR FLUORO GUIDE CV LINE RIGHT  03/01/2019   IR US GUIDE BX ASP/DRAIN  01/01/2019   IR US GUIDE VASC ACCESS RIGHT  03/01/2019   LEFT HEART CATH AND CORONARY ANGIOGRAPHY N/A 08/05/2016   Procedure: Left Heart Cath and Coronary Angiography;  Surgeon: [name], MD;  Location: [facility];  Service: Cardiovascular;  Laterality: N/A;   PERIPHERAL VASCULAR INTERVENTION Left 12/14/2018   Procedure: PERIPHERAL VASCULAR INTERVENTION;  Surgeon: [name], MD;  Location: [facility];  Service: Cardiovascular;  Laterality: Left;  SFA   PERIPHERAL VASCULAR INTERVENTION Left 04/25/2019   Procedure: PERIPHERAL VASCULAR INTERVENTION;  Surgeon: [name], MD;  Location: [facility];  Service: Cardiovascular;  Laterality: Left;  right superficial femoral, and left iliac   PERIPHERAL VASCULAR INTERVENTION Left 07/18/2019   Procedure: PERIPHERAL VASCULAR INTERVENTION;  Surgeon: [name], MD;  Location: [facility];   Service: Cardiovascular;  Laterality: Left;  external iliac   REDUCTION MAMMAPLASTY  2002   TONSILLECTOMY     TUBAL LIGATION  1996   VAGINAL HYSTERECTOMY  08/04/2005   and cysto   Social History   Occupational History   Occupation: Curator  Tobacco Use   Smoking status: Former    Years: 20.00    Types: Cigarettes    Quit date: 11/2016    Years since quitting: 4.7   Smokeless tobacco: Never  Vaping Use   Vaping Use: Never used  Substance and Sexual Activity   Alcohol use: No    Alcohol/week: 0.0 standard drinks of alcohol   Drug use: Yes    Frequency: 2.0 times per week    Types: Marijuana   Sexual activity: Not on file

## 2021-08-06 ENCOUNTER — Other Ambulatory Visit: Payer: Self-pay

## 2021-08-06 DIAGNOSIS — I739 Peripheral vascular disease, unspecified: Secondary | ICD-10-CM

## 2021-08-06 DIAGNOSIS — Z95828 Presence of other vascular implants and grafts: Secondary | ICD-10-CM

## 2021-08-06 NOTE — Telephone Encounter (Signed)
Paperwork has been received and placed on PCP desk for signature.

## 2021-08-07 DIAGNOSIS — N2581 Secondary hyperparathyroidism of renal origin: Secondary | ICD-10-CM | POA: Diagnosis not present

## 2021-08-07 DIAGNOSIS — Z992 Dependence on renal dialysis: Secondary | ICD-10-CM | POA: Diagnosis not present

## 2021-08-07 DIAGNOSIS — D509 Iron deficiency anemia, unspecified: Secondary | ICD-10-CM | POA: Diagnosis not present

## 2021-08-07 DIAGNOSIS — D631 Anemia in chronic kidney disease: Secondary | ICD-10-CM | POA: Diagnosis not present

## 2021-08-07 DIAGNOSIS — L299 Pruritus, unspecified: Secondary | ICD-10-CM | POA: Diagnosis not present

## 2021-08-07 DIAGNOSIS — N186 End stage renal disease: Secondary | ICD-10-CM | POA: Diagnosis not present

## 2021-08-10 DIAGNOSIS — N2581 Secondary hyperparathyroidism of renal origin: Secondary | ICD-10-CM | POA: Diagnosis not present

## 2021-08-10 DIAGNOSIS — D631 Anemia in chronic kidney disease: Secondary | ICD-10-CM | POA: Diagnosis not present

## 2021-08-10 DIAGNOSIS — D509 Iron deficiency anemia, unspecified: Secondary | ICD-10-CM | POA: Diagnosis not present

## 2021-08-10 DIAGNOSIS — L299 Pruritus, unspecified: Secondary | ICD-10-CM | POA: Diagnosis not present

## 2021-08-10 DIAGNOSIS — N186 End stage renal disease: Secondary | ICD-10-CM | POA: Diagnosis not present

## 2021-08-10 DIAGNOSIS — Z992 Dependence on renal dialysis: Secondary | ICD-10-CM | POA: Diagnosis not present

## 2021-08-11 ENCOUNTER — Ambulatory Visit: Payer: Self-pay

## 2021-08-11 DIAGNOSIS — Z992 Dependence on renal dialysis: Secondary | ICD-10-CM | POA: Diagnosis not present

## 2021-08-11 DIAGNOSIS — N186 End stage renal disease: Secondary | ICD-10-CM | POA: Diagnosis not present

## 2021-08-11 DIAGNOSIS — E1122 Type 2 diabetes mellitus with diabetic chronic kidney disease: Secondary | ICD-10-CM | POA: Diagnosis not present

## 2021-08-11 NOTE — Patient Outreach (Signed)
  Care Coordination   Initial Visit Note   08/11/2021 Name: BEAUX VERNE MRN: [id number] DOB: Apr 29, 1970  [name] is a 51 y.o. year old female who sees Charlott Rakes, MD for primary care. I spoke with  [name] by phone today  What matters to the patients health and wellness today?  Doing well, no concerns   Goals Addressed   None     SDOH assessments and interventions completed:   Yes SDOH Interventions Today    Flowsheet Row Most Recent Value  SDOH Interventions   Food Insecurity Interventions Intervention Not Indicated  Transportation Interventions Intervention Not Indicated       Care Coordination Interventions Activated:  No Care Coordination Interventions:  No, not indicated  Follow up plan: No further intervention required.  Encounter Outcome:  Pt. Visit Completed  Daneen Schick, BSW, CDP Social Worker, Certified Dementia Practitioner Care Coordination [phone]

## 2021-08-11 NOTE — Patient Instructions (Signed)
Visit Information  Thank you for taking time to visit with me today. Please don't hesitate to contact me if I can be of assistance to you.   Following are the goals we discussed today:   Goals Addressed   None     Please call the care guide team at [phone] if you need to schedule an appointment with me.  If you are experiencing a Mental Health or [place] or need someone to talk to, please call [phone]-TALK (toll free, 24 hour hotline)  The patient verbalized understanding of instructions, educational materials, and care plan provided today and DECLINED offer to receive copy of patient instructions, educational materials, and care plan.   No further follow up required: Please contact me as needed.  Daneen Schick, BSW, CDP Social Worker, Certified Dementia Practitioner Care Coordination [phone]

## 2021-08-12 ENCOUNTER — Telehealth: Payer: Self-pay | Admitting: Emergency Medicine

## 2021-08-12 NOTE — Telephone Encounter (Signed)
Copied from [place] [id number]. Topic: General - Inquiry >> Aug 12, 2021  8:53 AM Erskine Squibb wrote: Reason for CRM: Sandy from [facility] called in checking to see if the pricing order form has been signed. She says to use reference #[id number] when responding. Please assist further

## 2021-08-13 DIAGNOSIS — N186 End stage renal disease: Secondary | ICD-10-CM | POA: Diagnosis not present

## 2021-08-13 DIAGNOSIS — Z992 Dependence on renal dialysis: Secondary | ICD-10-CM | POA: Diagnosis not present

## 2021-08-13 DIAGNOSIS — L299 Pruritus, unspecified: Secondary | ICD-10-CM | POA: Diagnosis not present

## 2021-08-13 DIAGNOSIS — N2581 Secondary hyperparathyroidism of renal origin: Secondary | ICD-10-CM | POA: Diagnosis not present

## 2021-08-13 NOTE — Telephone Encounter (Signed)
Order has been received and will be faxed on Friday once pcp signs form.

## 2021-08-14 NOTE — Telephone Encounter (Signed)
Sandy calling from [facility] calling from an updated status. Advised of the below.

## 2021-08-17 ENCOUNTER — Encounter: Payer: Self-pay | Admitting: Physical Medicine and Rehabilitation

## 2021-08-17 NOTE — Telephone Encounter (Signed)
Susan Horton called about the pricing order form/ stated she hasnt received it back yet / please advise

## 2021-08-18 DIAGNOSIS — N186 End stage renal disease: Secondary | ICD-10-CM | POA: Diagnosis not present

## 2021-08-18 DIAGNOSIS — N2581 Secondary hyperparathyroidism of renal origin: Secondary | ICD-10-CM | POA: Diagnosis not present

## 2021-08-18 DIAGNOSIS — Z992 Dependence on renal dialysis: Secondary | ICD-10-CM | POA: Diagnosis not present

## 2021-08-18 DIAGNOSIS — L299 Pruritus, unspecified: Secondary | ICD-10-CM | POA: Diagnosis not present

## 2021-08-18 NOTE — Telephone Encounter (Signed)
Form was faxed on 08/17/2021

## 2021-08-20 DIAGNOSIS — L299 Pruritus, unspecified: Secondary | ICD-10-CM | POA: Diagnosis not present

## 2021-08-20 DIAGNOSIS — N186 End stage renal disease: Secondary | ICD-10-CM | POA: Diagnosis not present

## 2021-08-20 DIAGNOSIS — N2581 Secondary hyperparathyroidism of renal origin: Secondary | ICD-10-CM | POA: Diagnosis not present

## 2021-08-20 DIAGNOSIS — Z992 Dependence on renal dialysis: Secondary | ICD-10-CM | POA: Diagnosis not present

## 2021-08-25 DIAGNOSIS — S79911S Unspecified injury of right hip, sequela: Secondary | ICD-10-CM | POA: Diagnosis not present

## 2021-08-25 DIAGNOSIS — G8929 Other chronic pain: Secondary | ICD-10-CM | POA: Diagnosis not present

## 2021-08-25 DIAGNOSIS — G629 Polyneuropathy, unspecified: Secondary | ICD-10-CM | POA: Diagnosis not present

## 2021-08-25 DIAGNOSIS — M25512 Pain in left shoulder: Secondary | ICD-10-CM | POA: Diagnosis not present

## 2021-08-26 DIAGNOSIS — L299 Pruritus, unspecified: Secondary | ICD-10-CM | POA: Diagnosis not present

## 2021-08-26 DIAGNOSIS — D649 Anemia, unspecified: Secondary | ICD-10-CM | POA: Diagnosis not present

## 2021-08-26 DIAGNOSIS — K3184 Gastroparesis: Secondary | ICD-10-CM | POA: Diagnosis not present

## 2021-08-26 DIAGNOSIS — N186 End stage renal disease: Secondary | ICD-10-CM | POA: Diagnosis not present

## 2021-08-26 DIAGNOSIS — D631 Anemia in chronic kidney disease: Secondary | ICD-10-CM | POA: Diagnosis not present

## 2021-08-26 DIAGNOSIS — E1122 Type 2 diabetes mellitus with diabetic chronic kidney disease: Secondary | ICD-10-CM | POA: Diagnosis not present

## 2021-08-26 DIAGNOSIS — I132 Hypertensive heart and chronic kidney disease with heart failure and with stage 5 chronic kidney disease, or end stage renal disease: Secondary | ICD-10-CM | POA: Diagnosis not present

## 2021-08-26 DIAGNOSIS — I509 Heart failure, unspecified: Secondary | ICD-10-CM | POA: Diagnosis not present

## 2021-08-26 DIAGNOSIS — D509 Iron deficiency anemia, unspecified: Secondary | ICD-10-CM | POA: Diagnosis not present

## 2021-08-26 DIAGNOSIS — Z992 Dependence on renal dialysis: Secondary | ICD-10-CM | POA: Diagnosis not present

## 2021-08-26 DIAGNOSIS — F17210 Nicotine dependence, cigarettes, uncomplicated: Secondary | ICD-10-CM | POA: Diagnosis not present

## 2021-08-26 DIAGNOSIS — N2581 Secondary hyperparathyroidism of renal origin: Secondary | ICD-10-CM | POA: Diagnosis not present

## 2021-08-26 DIAGNOSIS — E1143 Type 2 diabetes mellitus with diabetic autonomic (poly)neuropathy: Secondary | ICD-10-CM | POA: Diagnosis not present

## 2021-08-26 NOTE — Progress Notes (Signed)
Susan Horton, Susan Horton ([id number]) Visit Report for 08/27/2021 Allergy List Details Patient Name: Date of Service: [facility] NES, [place] [facility] L. 08/27/2021 8:00 A M Medical Record Number: [id number] Patient Account Number: [id number] Date of Birth/Sex: Treating RN: 1970-03-08 (51 y.o. Susan Horton Primary Care Susan Horton: Other Clinician: Referring Susan Horton: Treating Susan Horton: Susan Horton in Treatment: 0 Allergies Active Allergies penicillin Reaction: swelling, itching Severity: Moderate Repatha SureClick Reaction: itching Severity: Mild lisinopril Allergy Notes Electronic Signature(s) Signed: 08/26/2021 10:23:41 AM By: Susan Horton Entered By: Susan Horton on 08/26/2021 10:23:41

## 2021-08-27 ENCOUNTER — Encounter (HOSPITAL_BASED_OUTPATIENT_CLINIC_OR_DEPARTMENT_OTHER): Payer: Medicare Other | Admitting: General Surgery

## 2021-08-28 DIAGNOSIS — D649 Anemia, unspecified: Secondary | ICD-10-CM | POA: Diagnosis not present

## 2021-08-28 DIAGNOSIS — Z992 Dependence on renal dialysis: Secondary | ICD-10-CM | POA: Diagnosis not present

## 2021-08-28 DIAGNOSIS — N186 End stage renal disease: Secondary | ICD-10-CM | POA: Diagnosis not present

## 2021-08-28 DIAGNOSIS — D631 Anemia in chronic kidney disease: Secondary | ICD-10-CM | POA: Diagnosis not present

## 2021-08-28 DIAGNOSIS — L299 Pruritus, unspecified: Secondary | ICD-10-CM | POA: Diagnosis not present

## 2021-08-28 DIAGNOSIS — N2581 Secondary hyperparathyroidism of renal origin: Secondary | ICD-10-CM | POA: Diagnosis not present

## 2021-08-31 ENCOUNTER — Telehealth: Payer: Self-pay | Admitting: Family

## 2021-08-31 DIAGNOSIS — Z992 Dependence on renal dialysis: Secondary | ICD-10-CM | POA: Diagnosis not present

## 2021-08-31 DIAGNOSIS — N186 End stage renal disease: Secondary | ICD-10-CM | POA: Diagnosis not present

## 2021-08-31 DIAGNOSIS — D649 Anemia, unspecified: Secondary | ICD-10-CM | POA: Diagnosis not present

## 2021-08-31 DIAGNOSIS — L299 Pruritus, unspecified: Secondary | ICD-10-CM | POA: Diagnosis not present

## 2021-08-31 DIAGNOSIS — N2581 Secondary hyperparathyroidism of renal origin: Secondary | ICD-10-CM | POA: Diagnosis not present

## 2021-08-31 DIAGNOSIS — D631 Anemia in chronic kidney disease: Secondary | ICD-10-CM | POA: Diagnosis not present

## 2021-08-31 NOTE — Telephone Encounter (Signed)
Patient came in requesting x rays of hips Lilia Pro was called and handled it. No further action neede

## 2021-09-01 DIAGNOSIS — N186 End stage renal disease: Secondary | ICD-10-CM | POA: Diagnosis not present

## 2021-09-01 DIAGNOSIS — Z992 Dependence on renal dialysis: Secondary | ICD-10-CM | POA: Diagnosis not present

## 2021-09-01 DIAGNOSIS — K3184 Gastroparesis: Secondary | ICD-10-CM | POA: Diagnosis not present

## 2021-09-01 DIAGNOSIS — E1143 Type 2 diabetes mellitus with diabetic autonomic (poly)neuropathy: Secondary | ICD-10-CM | POA: Diagnosis not present

## 2021-09-02 DIAGNOSIS — Z992 Dependence on renal dialysis: Secondary | ICD-10-CM | POA: Diagnosis not present

## 2021-09-02 DIAGNOSIS — D649 Anemia, unspecified: Secondary | ICD-10-CM | POA: Diagnosis not present

## 2021-09-02 DIAGNOSIS — L299 Pruritus, unspecified: Secondary | ICD-10-CM | POA: Diagnosis not present

## 2021-09-02 DIAGNOSIS — N186 End stage renal disease: Secondary | ICD-10-CM | POA: Diagnosis not present

## 2021-09-02 DIAGNOSIS — D631 Anemia in chronic kidney disease: Secondary | ICD-10-CM | POA: Diagnosis not present

## 2021-09-02 DIAGNOSIS — N2581 Secondary hyperparathyroidism of renal origin: Secondary | ICD-10-CM | POA: Diagnosis not present

## 2021-09-03 DIAGNOSIS — K3184 Gastroparesis: Secondary | ICD-10-CM | POA: Diagnosis not present

## 2021-09-04 DIAGNOSIS — N186 End stage renal disease: Secondary | ICD-10-CM | POA: Diagnosis not present

## 2021-09-04 DIAGNOSIS — D631 Anemia in chronic kidney disease: Secondary | ICD-10-CM | POA: Diagnosis not present

## 2021-09-04 DIAGNOSIS — Z992 Dependence on renal dialysis: Secondary | ICD-10-CM | POA: Diagnosis not present

## 2021-09-04 DIAGNOSIS — L299 Pruritus, unspecified: Secondary | ICD-10-CM | POA: Diagnosis not present

## 2021-09-04 DIAGNOSIS — N2581 Secondary hyperparathyroidism of renal origin: Secondary | ICD-10-CM | POA: Diagnosis not present

## 2021-09-04 DIAGNOSIS — D649 Anemia, unspecified: Secondary | ICD-10-CM | POA: Diagnosis not present

## 2021-09-07 DIAGNOSIS — Z992 Dependence on renal dialysis: Secondary | ICD-10-CM | POA: Diagnosis not present

## 2021-09-07 DIAGNOSIS — N2581 Secondary hyperparathyroidism of renal origin: Secondary | ICD-10-CM | POA: Diagnosis not present

## 2021-09-07 DIAGNOSIS — D649 Anemia, unspecified: Secondary | ICD-10-CM | POA: Diagnosis not present

## 2021-09-07 DIAGNOSIS — L299 Pruritus, unspecified: Secondary | ICD-10-CM | POA: Diagnosis not present

## 2021-09-07 DIAGNOSIS — N186 End stage renal disease: Secondary | ICD-10-CM | POA: Diagnosis not present

## 2021-09-07 DIAGNOSIS — D631 Anemia in chronic kidney disease: Secondary | ICD-10-CM | POA: Diagnosis not present

## 2021-09-09 DIAGNOSIS — D649 Anemia, unspecified: Secondary | ICD-10-CM | POA: Diagnosis not present

## 2021-09-09 DIAGNOSIS — N186 End stage renal disease: Secondary | ICD-10-CM | POA: Diagnosis not present

## 2021-09-09 DIAGNOSIS — N2581 Secondary hyperparathyroidism of renal origin: Secondary | ICD-10-CM | POA: Diagnosis not present

## 2021-09-09 DIAGNOSIS — L299 Pruritus, unspecified: Secondary | ICD-10-CM | POA: Diagnosis not present

## 2021-09-09 DIAGNOSIS — D631 Anemia in chronic kidney disease: Secondary | ICD-10-CM | POA: Diagnosis not present

## 2021-09-09 DIAGNOSIS — Z992 Dependence on renal dialysis: Secondary | ICD-10-CM | POA: Diagnosis not present

## 2021-09-10 ENCOUNTER — Other Ambulatory Visit: Payer: Self-pay | Admitting: Cardiovascular Disease

## 2021-09-11 DIAGNOSIS — D509 Iron deficiency anemia, unspecified: Secondary | ICD-10-CM | POA: Diagnosis not present

## 2021-09-11 DIAGNOSIS — Z992 Dependence on renal dialysis: Secondary | ICD-10-CM | POA: Diagnosis not present

## 2021-09-11 DIAGNOSIS — N186 End stage renal disease: Secondary | ICD-10-CM | POA: Diagnosis not present

## 2021-09-11 DIAGNOSIS — L299 Pruritus, unspecified: Secondary | ICD-10-CM | POA: Diagnosis not present

## 2021-09-11 DIAGNOSIS — D631 Anemia in chronic kidney disease: Secondary | ICD-10-CM | POA: Diagnosis not present

## 2021-09-11 DIAGNOSIS — E1122 Type 2 diabetes mellitus with diabetic chronic kidney disease: Secondary | ICD-10-CM | POA: Diagnosis not present

## 2021-09-11 DIAGNOSIS — D649 Anemia, unspecified: Secondary | ICD-10-CM | POA: Diagnosis not present

## 2021-09-11 DIAGNOSIS — N2581 Secondary hyperparathyroidism of renal origin: Secondary | ICD-10-CM | POA: Diagnosis not present

## 2021-09-14 DIAGNOSIS — Z992 Dependence on renal dialysis: Secondary | ICD-10-CM | POA: Diagnosis not present

## 2021-09-14 DIAGNOSIS — N2581 Secondary hyperparathyroidism of renal origin: Secondary | ICD-10-CM | POA: Diagnosis not present

## 2021-09-14 DIAGNOSIS — L299 Pruritus, unspecified: Secondary | ICD-10-CM | POA: Diagnosis not present

## 2021-09-14 DIAGNOSIS — D631 Anemia in chronic kidney disease: Secondary | ICD-10-CM | POA: Diagnosis not present

## 2021-09-14 DIAGNOSIS — N186 End stage renal disease: Secondary | ICD-10-CM | POA: Diagnosis not present

## 2021-09-14 DIAGNOSIS — D509 Iron deficiency anemia, unspecified: Secondary | ICD-10-CM | POA: Diagnosis not present

## 2021-09-16 DIAGNOSIS — D631 Anemia in chronic kidney disease: Secondary | ICD-10-CM | POA: Diagnosis not present

## 2021-09-16 DIAGNOSIS — Z992 Dependence on renal dialysis: Secondary | ICD-10-CM | POA: Diagnosis not present

## 2021-09-16 DIAGNOSIS — N2581 Secondary hyperparathyroidism of renal origin: Secondary | ICD-10-CM | POA: Diagnosis not present

## 2021-09-16 DIAGNOSIS — D509 Iron deficiency anemia, unspecified: Secondary | ICD-10-CM | POA: Diagnosis not present

## 2021-09-16 DIAGNOSIS — L299 Pruritus, unspecified: Secondary | ICD-10-CM | POA: Diagnosis not present

## 2021-09-16 DIAGNOSIS — N186 End stage renal disease: Secondary | ICD-10-CM | POA: Diagnosis not present

## 2021-09-17 DIAGNOSIS — Z7984 Long term (current) use of oral hypoglycemic drugs: Secondary | ICD-10-CM | POA: Diagnosis not present

## 2021-09-17 DIAGNOSIS — Z7982 Long term (current) use of aspirin: Secondary | ICD-10-CM | POA: Diagnosis not present

## 2021-09-17 DIAGNOSIS — Z87891 Personal history of nicotine dependence: Secondary | ICD-10-CM | POA: Diagnosis not present

## 2021-09-17 DIAGNOSIS — Z955 Presence of coronary angioplasty implant and graft: Secondary | ICD-10-CM | POA: Diagnosis not present

## 2021-09-17 DIAGNOSIS — D631 Anemia in chronic kidney disease: Secondary | ICD-10-CM | POA: Diagnosis not present

## 2021-09-17 DIAGNOSIS — R011 Cardiac murmur, unspecified: Secondary | ICD-10-CM | POA: Diagnosis not present

## 2021-09-17 DIAGNOSIS — I132 Hypertensive heart and chronic kidney disease with heart failure and with stage 5 chronic kidney disease, or end stage renal disease: Secondary | ICD-10-CM | POA: Diagnosis not present

## 2021-09-17 DIAGNOSIS — R9431 Abnormal electrocardiogram [ECG] [EKG]: Secondary | ICD-10-CM | POA: Diagnosis not present

## 2021-09-17 DIAGNOSIS — Z7902 Long term (current) use of antithrombotics/antiplatelets: Secondary | ICD-10-CM | POA: Diagnosis not present

## 2021-09-17 DIAGNOSIS — Z79899 Other long term (current) drug therapy: Secondary | ICD-10-CM | POA: Diagnosis not present

## 2021-09-17 DIAGNOSIS — J45909 Unspecified asthma, uncomplicated: Secondary | ICD-10-CM | POA: Diagnosis not present

## 2021-09-17 DIAGNOSIS — K3184 Gastroparesis: Secondary | ICD-10-CM | POA: Diagnosis not present

## 2021-09-17 DIAGNOSIS — I2510 Atherosclerotic heart disease of native coronary artery without angina pectoris: Secondary | ICD-10-CM | POA: Diagnosis not present

## 2021-09-17 DIAGNOSIS — I5030 Unspecified diastolic (congestive) heart failure: Secondary | ICD-10-CM | POA: Diagnosis not present

## 2021-09-17 DIAGNOSIS — K3189 Other diseases of stomach and duodenum: Secondary | ICD-10-CM | POA: Diagnosis not present

## 2021-09-17 DIAGNOSIS — Z1159 Encounter for screening for other viral diseases: Secondary | ICD-10-CM | POA: Diagnosis not present

## 2021-09-17 DIAGNOSIS — Z992 Dependence on renal dialysis: Secondary | ICD-10-CM | POA: Diagnosis not present

## 2021-09-17 DIAGNOSIS — K219 Gastro-esophageal reflux disease without esophagitis: Secondary | ICD-10-CM | POA: Diagnosis not present

## 2021-09-17 DIAGNOSIS — Z9889 Other specified postprocedural states: Secondary | ICD-10-CM | POA: Diagnosis not present

## 2021-09-17 DIAGNOSIS — K313 Pylorospasm, not elsewhere classified: Secondary | ICD-10-CM | POA: Diagnosis not present

## 2021-09-17 DIAGNOSIS — E1143 Type 2 diabetes mellitus with diabetic autonomic (poly)neuropathy: Secondary | ICD-10-CM | POA: Diagnosis not present

## 2021-09-17 DIAGNOSIS — N186 End stage renal disease: Secondary | ICD-10-CM | POA: Diagnosis not present

## 2021-09-17 DIAGNOSIS — I6523 Occlusion and stenosis of bilateral carotid arteries: Secondary | ICD-10-CM | POA: Diagnosis not present

## 2021-09-18 DIAGNOSIS — I132 Hypertensive heart and chronic kidney disease with heart failure and with stage 5 chronic kidney disease, or end stage renal disease: Secondary | ICD-10-CM | POA: Diagnosis not present

## 2021-09-18 DIAGNOSIS — K3184 Gastroparesis: Secondary | ICD-10-CM | POA: Diagnosis not present

## 2021-09-18 DIAGNOSIS — Z87891 Personal history of nicotine dependence: Secondary | ICD-10-CM | POA: Diagnosis not present

## 2021-09-18 DIAGNOSIS — R9431 Abnormal electrocardiogram [ECG] [EKG]: Secondary | ICD-10-CM | POA: Diagnosis not present

## 2021-09-18 DIAGNOSIS — K224 Dyskinesia of esophagus: Secondary | ICD-10-CM | POA: Diagnosis not present

## 2021-09-18 DIAGNOSIS — K313 Pylorospasm, not elsewhere classified: Secondary | ICD-10-CM | POA: Diagnosis not present

## 2021-09-18 DIAGNOSIS — Z992 Dependence on renal dialysis: Secondary | ICD-10-CM | POA: Diagnosis not present

## 2021-09-18 DIAGNOSIS — I509 Heart failure, unspecified: Secondary | ICD-10-CM | POA: Diagnosis not present

## 2021-09-18 DIAGNOSIS — I5030 Unspecified diastolic (congestive) heart failure: Secondary | ICD-10-CM | POA: Diagnosis not present

## 2021-09-18 DIAGNOSIS — I2510 Atherosclerotic heart disease of native coronary artery without angina pectoris: Secondary | ICD-10-CM | POA: Diagnosis not present

## 2021-09-18 DIAGNOSIS — E1143 Type 2 diabetes mellitus with diabetic autonomic (poly)neuropathy: Secondary | ICD-10-CM | POA: Diagnosis not present

## 2021-09-18 DIAGNOSIS — E1122 Type 2 diabetes mellitus with diabetic chronic kidney disease: Secondary | ICD-10-CM | POA: Diagnosis not present

## 2021-09-18 DIAGNOSIS — N186 End stage renal disease: Secondary | ICD-10-CM | POA: Diagnosis not present

## 2021-09-21 DIAGNOSIS — N2581 Secondary hyperparathyroidism of renal origin: Secondary | ICD-10-CM | POA: Diagnosis not present

## 2021-09-21 DIAGNOSIS — D631 Anemia in chronic kidney disease: Secondary | ICD-10-CM | POA: Diagnosis not present

## 2021-09-21 DIAGNOSIS — N186 End stage renal disease: Secondary | ICD-10-CM | POA: Diagnosis not present

## 2021-09-21 DIAGNOSIS — L299 Pruritus, unspecified: Secondary | ICD-10-CM | POA: Diagnosis not present

## 2021-09-21 DIAGNOSIS — D509 Iron deficiency anemia, unspecified: Secondary | ICD-10-CM | POA: Diagnosis not present

## 2021-09-21 DIAGNOSIS — Z992 Dependence on renal dialysis: Secondary | ICD-10-CM | POA: Diagnosis not present

## 2021-09-21 DIAGNOSIS — K3184 Gastroparesis: Secondary | ICD-10-CM | POA: Diagnosis not present

## 2021-09-23 DIAGNOSIS — D631 Anemia in chronic kidney disease: Secondary | ICD-10-CM | POA: Diagnosis not present

## 2021-09-23 DIAGNOSIS — D509 Iron deficiency anemia, unspecified: Secondary | ICD-10-CM | POA: Diagnosis not present

## 2021-09-23 DIAGNOSIS — N2581 Secondary hyperparathyroidism of renal origin: Secondary | ICD-10-CM | POA: Diagnosis not present

## 2021-09-23 DIAGNOSIS — Z992 Dependence on renal dialysis: Secondary | ICD-10-CM | POA: Diagnosis not present

## 2021-09-23 DIAGNOSIS — L299 Pruritus, unspecified: Secondary | ICD-10-CM | POA: Diagnosis not present

## 2021-09-23 DIAGNOSIS — N186 End stage renal disease: Secondary | ICD-10-CM | POA: Diagnosis not present

## 2021-09-25 DIAGNOSIS — D631 Anemia in chronic kidney disease: Secondary | ICD-10-CM | POA: Diagnosis not present

## 2021-09-25 DIAGNOSIS — D509 Iron deficiency anemia, unspecified: Secondary | ICD-10-CM | POA: Diagnosis not present

## 2021-09-25 DIAGNOSIS — N2581 Secondary hyperparathyroidism of renal origin: Secondary | ICD-10-CM | POA: Diagnosis not present

## 2021-09-25 DIAGNOSIS — N186 End stage renal disease: Secondary | ICD-10-CM | POA: Diagnosis not present

## 2021-09-25 DIAGNOSIS — L299 Pruritus, unspecified: Secondary | ICD-10-CM | POA: Diagnosis not present

## 2021-09-25 DIAGNOSIS — Z992 Dependence on renal dialysis: Secondary | ICD-10-CM | POA: Diagnosis not present

## 2021-09-28 DIAGNOSIS — D631 Anemia in chronic kidney disease: Secondary | ICD-10-CM | POA: Diagnosis not present

## 2021-09-28 DIAGNOSIS — N2581 Secondary hyperparathyroidism of renal origin: Secondary | ICD-10-CM | POA: Diagnosis not present

## 2021-09-28 DIAGNOSIS — N186 End stage renal disease: Secondary | ICD-10-CM | POA: Diagnosis not present

## 2021-09-28 DIAGNOSIS — L299 Pruritus, unspecified: Secondary | ICD-10-CM | POA: Diagnosis not present

## 2021-09-28 DIAGNOSIS — Z992 Dependence on renal dialysis: Secondary | ICD-10-CM | POA: Diagnosis not present

## 2021-09-28 DIAGNOSIS — D509 Iron deficiency anemia, unspecified: Secondary | ICD-10-CM | POA: Diagnosis not present

## 2021-09-29 DIAGNOSIS — K3184 Gastroparesis: Secondary | ICD-10-CM | POA: Diagnosis not present

## 2021-09-30 DIAGNOSIS — N186 End stage renal disease: Secondary | ICD-10-CM | POA: Diagnosis not present

## 2021-09-30 DIAGNOSIS — L299 Pruritus, unspecified: Secondary | ICD-10-CM | POA: Diagnosis not present

## 2021-09-30 DIAGNOSIS — D509 Iron deficiency anemia, unspecified: Secondary | ICD-10-CM | POA: Diagnosis not present

## 2021-09-30 DIAGNOSIS — D631 Anemia in chronic kidney disease: Secondary | ICD-10-CM | POA: Diagnosis not present

## 2021-09-30 DIAGNOSIS — N2581 Secondary hyperparathyroidism of renal origin: Secondary | ICD-10-CM | POA: Diagnosis not present

## 2021-09-30 DIAGNOSIS — Z992 Dependence on renal dialysis: Secondary | ICD-10-CM | POA: Diagnosis not present

## 2021-10-02 DIAGNOSIS — D509 Iron deficiency anemia, unspecified: Secondary | ICD-10-CM | POA: Diagnosis not present

## 2021-10-02 DIAGNOSIS — Z992 Dependence on renal dialysis: Secondary | ICD-10-CM | POA: Diagnosis not present

## 2021-10-02 DIAGNOSIS — D631 Anemia in chronic kidney disease: Secondary | ICD-10-CM | POA: Diagnosis not present

## 2021-10-02 DIAGNOSIS — N186 End stage renal disease: Secondary | ICD-10-CM | POA: Diagnosis not present

## 2021-10-02 DIAGNOSIS — L299 Pruritus, unspecified: Secondary | ICD-10-CM | POA: Diagnosis not present

## 2021-10-02 DIAGNOSIS — N2581 Secondary hyperparathyroidism of renal origin: Secondary | ICD-10-CM | POA: Diagnosis not present

## 2021-10-05 DIAGNOSIS — N186 End stage renal disease: Secondary | ICD-10-CM | POA: Diagnosis not present

## 2021-10-05 DIAGNOSIS — D631 Anemia in chronic kidney disease: Secondary | ICD-10-CM | POA: Diagnosis not present

## 2021-10-05 DIAGNOSIS — L299 Pruritus, unspecified: Secondary | ICD-10-CM | POA: Diagnosis not present

## 2021-10-05 DIAGNOSIS — Z992 Dependence on renal dialysis: Secondary | ICD-10-CM | POA: Diagnosis not present

## 2021-10-05 DIAGNOSIS — N2581 Secondary hyperparathyroidism of renal origin: Secondary | ICD-10-CM | POA: Diagnosis not present

## 2021-10-05 DIAGNOSIS — D509 Iron deficiency anemia, unspecified: Secondary | ICD-10-CM | POA: Diagnosis not present

## 2021-10-07 DIAGNOSIS — Z992 Dependence on renal dialysis: Secondary | ICD-10-CM | POA: Diagnosis not present

## 2021-10-07 DIAGNOSIS — N2581 Secondary hyperparathyroidism of renal origin: Secondary | ICD-10-CM | POA: Diagnosis not present

## 2021-10-07 DIAGNOSIS — L299 Pruritus, unspecified: Secondary | ICD-10-CM | POA: Diagnosis not present

## 2021-10-07 DIAGNOSIS — N186 End stage renal disease: Secondary | ICD-10-CM | POA: Diagnosis not present

## 2021-10-07 DIAGNOSIS — D631 Anemia in chronic kidney disease: Secondary | ICD-10-CM | POA: Diagnosis not present

## 2021-10-07 DIAGNOSIS — D509 Iron deficiency anemia, unspecified: Secondary | ICD-10-CM | POA: Diagnosis not present

## 2021-10-08 DIAGNOSIS — D631 Anemia in chronic kidney disease: Secondary | ICD-10-CM | POA: Diagnosis not present

## 2021-10-08 DIAGNOSIS — N186 End stage renal disease: Secondary | ICD-10-CM | POA: Diagnosis not present

## 2021-10-08 DIAGNOSIS — D509 Iron deficiency anemia, unspecified: Secondary | ICD-10-CM | POA: Diagnosis not present

## 2021-10-08 DIAGNOSIS — N2581 Secondary hyperparathyroidism of renal origin: Secondary | ICD-10-CM | POA: Diagnosis not present

## 2021-10-08 DIAGNOSIS — Z992 Dependence on renal dialysis: Secondary | ICD-10-CM | POA: Diagnosis not present

## 2021-10-08 DIAGNOSIS — L299 Pruritus, unspecified: Secondary | ICD-10-CM | POA: Diagnosis not present

## 2021-10-09 DIAGNOSIS — I6523 Occlusion and stenosis of bilateral carotid arteries: Secondary | ICD-10-CM | POA: Diagnosis not present

## 2021-10-09 DIAGNOSIS — Z79899 Other long term (current) drug therapy: Secondary | ICD-10-CM | POA: Diagnosis not present

## 2021-10-09 DIAGNOSIS — J45909 Unspecified asthma, uncomplicated: Secondary | ICD-10-CM | POA: Diagnosis not present

## 2021-10-09 DIAGNOSIS — Z7982 Long term (current) use of aspirin: Secondary | ICD-10-CM | POA: Diagnosis not present

## 2021-10-09 DIAGNOSIS — E785 Hyperlipidemia, unspecified: Secondary | ICD-10-CM | POA: Diagnosis not present

## 2021-10-09 DIAGNOSIS — I2510 Atherosclerotic heart disease of native coronary artery without angina pectoris: Secondary | ICD-10-CM | POA: Diagnosis not present

## 2021-10-09 DIAGNOSIS — Z992 Dependence on renal dialysis: Secondary | ICD-10-CM | POA: Diagnosis not present

## 2021-10-09 DIAGNOSIS — Z955 Presence of coronary angioplasty implant and graft: Secondary | ICD-10-CM | POA: Diagnosis not present

## 2021-10-09 DIAGNOSIS — Z87891 Personal history of nicotine dependence: Secondary | ICD-10-CM | POA: Diagnosis not present

## 2021-10-09 DIAGNOSIS — N186 End stage renal disease: Secondary | ICD-10-CM | POA: Diagnosis not present

## 2021-10-09 DIAGNOSIS — Z7902 Long term (current) use of antithrombotics/antiplatelets: Secondary | ICD-10-CM | POA: Diagnosis not present

## 2021-10-09 DIAGNOSIS — K3184 Gastroparesis: Secondary | ICD-10-CM | POA: Diagnosis not present

## 2021-10-09 DIAGNOSIS — K219 Gastro-esophageal reflux disease without esophagitis: Secondary | ICD-10-CM | POA: Diagnosis not present

## 2021-10-09 DIAGNOSIS — G8918 Other acute postprocedural pain: Secondary | ICD-10-CM | POA: Diagnosis not present

## 2021-10-09 DIAGNOSIS — I5032 Chronic diastolic (congestive) heart failure: Secondary | ICD-10-CM | POA: Diagnosis not present

## 2021-10-09 DIAGNOSIS — Z86718 Personal history of other venous thrombosis and embolism: Secondary | ICD-10-CM | POA: Diagnosis not present

## 2021-10-09 DIAGNOSIS — E1143 Type 2 diabetes mellitus with diabetic autonomic (poly)neuropathy: Secondary | ICD-10-CM | POA: Diagnosis not present

## 2021-10-09 DIAGNOSIS — I132 Hypertensive heart and chronic kidney disease with heart failure and with stage 5 chronic kidney disease, or end stage renal disease: Secondary | ICD-10-CM | POA: Diagnosis not present

## 2021-10-09 DIAGNOSIS — Z7951 Long term (current) use of inhaled steroids: Secondary | ICD-10-CM | POA: Diagnosis not present

## 2021-10-09 DIAGNOSIS — E1122 Type 2 diabetes mellitus with diabetic chronic kidney disease: Secondary | ICD-10-CM | POA: Diagnosis not present

## 2021-10-09 DIAGNOSIS — D631 Anemia in chronic kidney disease: Secondary | ICD-10-CM | POA: Diagnosis not present

## 2021-10-10 DIAGNOSIS — N186 End stage renal disease: Secondary | ICD-10-CM | POA: Diagnosis not present

## 2021-10-10 DIAGNOSIS — I132 Hypertensive heart and chronic kidney disease with heart failure and with stage 5 chronic kidney disease, or end stage renal disease: Secondary | ICD-10-CM | POA: Diagnosis not present

## 2021-10-10 DIAGNOSIS — I5032 Chronic diastolic (congestive) heart failure: Secondary | ICD-10-CM | POA: Diagnosis not present

## 2021-10-10 DIAGNOSIS — E1122 Type 2 diabetes mellitus with diabetic chronic kidney disease: Secondary | ICD-10-CM | POA: Diagnosis not present

## 2021-10-10 DIAGNOSIS — E1143 Type 2 diabetes mellitus with diabetic autonomic (poly)neuropathy: Secondary | ICD-10-CM | POA: Diagnosis not present

## 2021-10-11 DIAGNOSIS — N186 End stage renal disease: Secondary | ICD-10-CM | POA: Diagnosis not present

## 2021-10-11 DIAGNOSIS — E1122 Type 2 diabetes mellitus with diabetic chronic kidney disease: Secondary | ICD-10-CM | POA: Diagnosis not present

## 2021-10-11 DIAGNOSIS — Z992 Dependence on renal dialysis: Secondary | ICD-10-CM | POA: Diagnosis not present

## 2021-10-14 DIAGNOSIS — K3184 Gastroparesis: Secondary | ICD-10-CM | POA: Diagnosis not present

## 2021-10-14 DIAGNOSIS — D649 Anemia, unspecified: Secondary | ICD-10-CM | POA: Diagnosis not present

## 2021-10-14 DIAGNOSIS — D631 Anemia in chronic kidney disease: Secondary | ICD-10-CM | POA: Diagnosis not present

## 2021-10-14 DIAGNOSIS — N186 End stage renal disease: Secondary | ICD-10-CM | POA: Diagnosis not present

## 2021-10-14 DIAGNOSIS — D509 Iron deficiency anemia, unspecified: Secondary | ICD-10-CM | POA: Diagnosis not present

## 2021-10-14 DIAGNOSIS — L299 Pruritus, unspecified: Secondary | ICD-10-CM | POA: Diagnosis not present

## 2021-10-14 DIAGNOSIS — N2581 Secondary hyperparathyroidism of renal origin: Secondary | ICD-10-CM | POA: Diagnosis not present

## 2021-10-14 DIAGNOSIS — Z992 Dependence on renal dialysis: Secondary | ICD-10-CM | POA: Diagnosis not present

## 2021-10-14 DIAGNOSIS — Z48815 Encounter for surgical aftercare following surgery on the digestive system: Secondary | ICD-10-CM | POA: Diagnosis not present

## 2021-10-16 ENCOUNTER — Encounter: Payer: Medicare Other | Admitting: Physical Medicine and Rehabilitation

## 2021-10-16 DIAGNOSIS — D509 Iron deficiency anemia, unspecified: Secondary | ICD-10-CM | POA: Diagnosis not present

## 2021-10-16 DIAGNOSIS — N186 End stage renal disease: Secondary | ICD-10-CM | POA: Diagnosis not present

## 2021-10-16 DIAGNOSIS — D631 Anemia in chronic kidney disease: Secondary | ICD-10-CM | POA: Diagnosis not present

## 2021-10-16 DIAGNOSIS — N2581 Secondary hyperparathyroidism of renal origin: Secondary | ICD-10-CM | POA: Diagnosis not present

## 2021-10-16 DIAGNOSIS — L299 Pruritus, unspecified: Secondary | ICD-10-CM | POA: Diagnosis not present

## 2021-10-16 DIAGNOSIS — Z992 Dependence on renal dialysis: Secondary | ICD-10-CM | POA: Diagnosis not present

## 2021-10-20 DIAGNOSIS — Z4802 Encounter for removal of sutures: Secondary | ICD-10-CM | POA: Diagnosis not present

## 2021-10-21 DIAGNOSIS — D631 Anemia in chronic kidney disease: Secondary | ICD-10-CM | POA: Diagnosis not present

## 2021-10-21 DIAGNOSIS — N186 End stage renal disease: Secondary | ICD-10-CM | POA: Diagnosis not present

## 2021-10-21 DIAGNOSIS — Z992 Dependence on renal dialysis: Secondary | ICD-10-CM | POA: Diagnosis not present

## 2021-10-21 DIAGNOSIS — D509 Iron deficiency anemia, unspecified: Secondary | ICD-10-CM | POA: Diagnosis not present

## 2021-10-21 DIAGNOSIS — L299 Pruritus, unspecified: Secondary | ICD-10-CM | POA: Diagnosis not present

## 2021-10-21 DIAGNOSIS — N2581 Secondary hyperparathyroidism of renal origin: Secondary | ICD-10-CM | POA: Diagnosis not present

## 2021-10-23 DIAGNOSIS — N186 End stage renal disease: Secondary | ICD-10-CM | POA: Diagnosis not present

## 2021-10-23 DIAGNOSIS — L299 Pruritus, unspecified: Secondary | ICD-10-CM | POA: Diagnosis not present

## 2021-10-23 DIAGNOSIS — Z992 Dependence on renal dialysis: Secondary | ICD-10-CM | POA: Diagnosis not present

## 2021-10-23 DIAGNOSIS — D631 Anemia in chronic kidney disease: Secondary | ICD-10-CM | POA: Diagnosis not present

## 2021-10-23 DIAGNOSIS — D509 Iron deficiency anemia, unspecified: Secondary | ICD-10-CM | POA: Diagnosis not present

## 2021-10-23 DIAGNOSIS — E1122 Type 2 diabetes mellitus with diabetic chronic kidney disease: Secondary | ICD-10-CM | POA: Diagnosis not present

## 2021-10-23 DIAGNOSIS — N2581 Secondary hyperparathyroidism of renal origin: Secondary | ICD-10-CM | POA: Diagnosis not present

## 2021-10-26 DIAGNOSIS — N186 End stage renal disease: Secondary | ICD-10-CM | POA: Diagnosis not present

## 2021-10-26 DIAGNOSIS — Z992 Dependence on renal dialysis: Secondary | ICD-10-CM | POA: Diagnosis not present

## 2021-10-26 DIAGNOSIS — L299 Pruritus, unspecified: Secondary | ICD-10-CM | POA: Diagnosis not present

## 2021-10-26 DIAGNOSIS — N2581 Secondary hyperparathyroidism of renal origin: Secondary | ICD-10-CM | POA: Diagnosis not present

## 2021-10-26 DIAGNOSIS — D509 Iron deficiency anemia, unspecified: Secondary | ICD-10-CM | POA: Diagnosis not present

## 2021-10-26 DIAGNOSIS — D631 Anemia in chronic kidney disease: Secondary | ICD-10-CM | POA: Diagnosis not present

## 2021-10-27 ENCOUNTER — Ambulatory Visit (HOSPITAL_COMMUNITY)
Admission: RE | Admit: 2021-10-27 | Discharge: 2021-10-27 | Disposition: A | Discharge status: Home / Self Care | Payer: Medicare Other | Source: Ambulatory Visit | Attending: Vascular Surgery | Admitting: Vascular Surgery

## 2021-10-27 ENCOUNTER — Ambulatory Visit (INDEPENDENT_AMBULATORY_CARE_PROVIDER_SITE_OTHER): Payer: Medicare Other | Admitting: Vascular Surgery

## 2021-10-27 ENCOUNTER — Other Ambulatory Visit: Payer: Self-pay

## 2021-10-27 ENCOUNTER — Ambulatory Visit (INDEPENDENT_AMBULATORY_CARE_PROVIDER_SITE_OTHER)
Admission: RE | Admit: 2021-10-27 | Discharge: 2021-10-27 | Disposition: A | Discharge status: Home / Self Care | Payer: Medicare Other | Source: Ambulatory Visit | Attending: Vascular Surgery | Admitting: Vascular Surgery

## 2021-10-27 ENCOUNTER — Encounter: Payer: Self-pay | Admitting: Vascular Surgery

## 2021-10-27 VITALS — BP 155/73 | HR 62 | Temp 97.6°F | Resp 16 | Ht 65.0 in | Wt 133.0 lb

## 2021-10-27 DIAGNOSIS — I739 Peripheral vascular disease, unspecified: Secondary | ICD-10-CM | POA: Insufficient documentation

## 2021-10-27 DIAGNOSIS — Z95828 Presence of other vascular implants and grafts: Secondary | ICD-10-CM

## 2021-10-27 NOTE — Progress Notes (Signed)
Patient name: Susan Horton MRN: [id number] DOB: 01/05/1971 Sex: female  REASON FOR VISIT: 24-monthfollow-up for surveillance of PAD  HPI: Susan ENSZis a 51y.o. female with multiple medical problems as listed below including DM and ESRD on HD M/W/F that presents for 3 month follow-up of her PAD.  She has previously been complaining of left hip pain and [facility].  Since her hip surgery several weeks ago has had increasing pain in the left foot.  States it hurts all the time.  Her vascular history includes left SFA stenting on 12/14/2018, right SFA stenting with left external iliac artery stenting on 04/25/2019, with an additional left external iliac stenting on 07/18/2019 all for lifestyle limiting claudication.  Past Medical History:  Diagnosis Date   Abnormal stress test    Anemia    Angio-edema    Asthma    CAD (coronary artery disease)    DES to mid LAD July 2018, residual moderate RCA disease   Cataract    CKD (chronic kidney disease) stage 4, GFR 15-29 ml/min (HCC)    Dialysis T/Th/Sa   DVT ([place] venous [place]) ([place]    1996 during pregnancy, 2015 left leg   Eczema    Essential hypertension 12/11/2015   Gastroparesis    GERD (gastroesophageal reflux disease)    Gluteal abscess 12/27/2018   Hidradenitis    Migraine    Neuropathy    Peripheral vascular disease (HCC)    blood clot in leg   Progressive angina ([place] 06/05/2014   Chest pain   Type 2 diabetes mellitus ([place]    Type II diabetes mellitus ([place]    Urticaria     Past Surgical History:  Procedure Laterality Date   A/V FISTULAGRAM N/A 06/22/2017   Procedure: A/V FISTULAGRAM - left arm;  Surgeon: CWaynetta Sandy MD;  [facility];  Service: Cardiovascular;  Laterality: N/A;   ABDOMINAL AORTOGRAM W/LOWER EXTREMITY Bilateral 12/14/2018   Procedure: ABDOMINAL AORTOGRAM W/LOWER EXTREMITY;  Surgeon: CMarty Heck MD;  [place] [facility];  Service: Cardiovascular;  Laterality: Bilateral;   ABDOMINAL AORTOGRAM W/LOWER EXTREMITY N/A 04/25/2019   Procedure: ABDOMINAL AORTOGRAM W/LOWER EXTREMITY;  Surgeon: CMarty Heck MD;  [facility];  Service: Cardiovascular;  Laterality: N/A;   ABDOMINAL AORTOGRAM W/LOWER EXTREMITY Left 07/18/2019   Procedure: ABDOMINAL AORTOGRAM W/LOWER EXTREMITY;  Surgeon: CMarty Heck MD;  [place] [facility];  Service: Cardiovascular;  Laterality: Left;   ABDOMINOPLASTY     ADENOIDECTOMY     APPENDECTOMY  1995   AV FISTULA PLACEMENT Left 02/01/2017   Procedure: ARTERIOVENOUS BRACHIOCEPHALIC (AV) FISTULA CREATION;  Surgeon: FElam Dutch MD;  [place] [facility];  Service: Vascular;  Laterality: Left;   [place]ordo[place]VENTION N/A 08/05/2016   Procedure: Coronary Stent Intervention;  Surgeon: BLorretta Harp MD;  [facility];  Service: Cardiovascular;  Laterality: N/A;   ESOPHAGOGASTRODUODENOSCOPY (EGD) WITH PROPOFOL N/A 07/01/2020   Procedure: ESOPHAGOGASTRODUODENOSCOPY (EGD) WITH PROPOFOL;  Surgeon: GGatha Mayer MD;  [place]  Service: Endoscopy;  Laterality: N/A;   EXPLORATORY LAPAROTOMY  08/14/2005   lysis of adhesions, drainage of tubo-ovarian abscess   FISTULA SUPERFICIALIZATION Left 09/14/2017   Procedure: FISTULA SUPERFICIALIZATION ARTERIOVENOUS FISTULA LEFT ARM;  Surgeon: CMarty Heck MD;  [facility];  Service: Vascular;  Laterality: Left;   FLEXIBLE SIGMOIDOSCOPY N/A 01/04/2019   Procedure: FLEXIBLE SIGMOIDOSCOPY;  Surgeon: Clarene Essex, MD;  Location: [place];  Service: Endoscopy;  Laterality: N/A;   HYDRADENITIS EXCISION  02/08/2011   Procedure: EXCISION HYDRADENITIS GROIN;  Surgeon: [name], MD;  Location: [place];  Service: General;  Laterality: N/A;  Excisioin of  Hidradenitis Left groin   INCISION AND DRAINAGE ABSCESS N/A 01/11/2019   Procedure: INCISION AND DRAINAGE SACRAL ABSCESS;  Surgeon: Georganna Skeans, MD;  Location: [place];  Service: General;  Laterality: N/A;   INGUINAL HIDRADENITIS EXCISION  07/06/2010   bilateral   INSERTION OF DIALYSIS CATHETER N/A 09/14/2017   Procedure: INSERTION OF TUNNELED DIALYSIS CATHETER Right Internal Jugular;  Surgeon: [name], MD;  Location: [place];  Service: Vascular;  Laterality: N/A;   IR FLUORO GUIDE CV LINE RIGHT  03/01/2019   IR US GUIDE BX ASP/DRAIN  01/01/2019   IR US GUIDE VASC ACCESS RIGHT  03/01/2019   LEFT HEART CATH AND CORONARY ANGIOGRAPHY N/A 08/05/2016   Procedure: Left Heart Cath and Coronary Angiography;  Surgeon: [name], MD;  Location: [place] [facility] LAB;  Service: Cardiovascular;  Laterality: N/A;   PERIPHERAL VASCULAR INTERVENTION Left 12/14/2018   Procedure: PERIPHERAL VASCULAR INTERVENTION;  Surgeon: [name], MD;  Location: [place] [facility];  Service: Cardiovascular;  Laterality: Left;  SFA   PERIPHERAL VASCULAR INTERVENTION Left 04/25/2019   Procedure: PERIPHERAL VASCULAR INTERVENTION;  Surgeon: [name], MD;  Location: [place] [facility];  Service: Cardiovascular;  Laterality: Left;  right superficial femoral, and left iliac   PERIPHERAL VASCULAR INTERVENTION Left 07/18/2019   Procedure: PERIPHERAL VASCULAR INTERVENTION;  Surgeon: [name], MD;  Location: [place] CV LAB;  Service: Cardiovascular;  Laterality: Left;  external iliac   REDUCTION MAMMAPLASTY  2002   TONSILLECTOMY     TUBAL LIGATION  1996   VAGINAL HYSTERECTOMY  08/04/2005   and cysto    Family History  Problem Relation Age of Onset   Allergic rhinitis Mother    Hypertension Father    Allergic rhinitis Father    Hypertension Sister    Allergic rhinitis Sister    Hypertension Brother    Allergic rhinitis Brother    Breast cancer Cousin        x2    SOCIAL  HISTORY: Social History   Tobacco Use   Smoking status: Former    Years: 20.00    Types: Cigarettes    Quit date: 11/2016    Years since quitting: 4.9   Smokeless tobacco: Never  Substance Use Topics   Alcohol use: No    Alcohol/week: 0.0 standard drinks of alcohol    Allergies  Allergen Reactions   Penicillins Anaphylaxis, Itching, Swelling, Rash and Other (See Comments)    Swelling of throat & whole mouth  Has patient had a PCN reaction causing immediate rash, facial/tongue/throat swelling, SOB or lightheadedness with hypotension: Yes Has patient had a PCN reaction causing severe rash involving mucus membranes or skin necrosis: No Has patient had a PCN reaction that required hospitalization: No Has patient had a PCN reaction occurring within the last 10 years: No If all of the above answers are "NO", then may proceed with Cephalosporin use.   Repatha [Evolocumab] Itching   Lisinopril Cough    Current Outpatient Medications  Medication Sig Dispense Refill   acetaminophen-codeine (TYLENOL #3) 300-30 MG tablet Take 1 tablet by  mouth at bedtime as needed for moderate pain. 30 tablet 0   albuterol (VENTOLIN HFA) 108 (90 Base) MCG/ACT inhaler Inhale 1-2 puffs into the lungs every 6 (six) hours as needed for wheezing or shortness of breath. 18 g 2   amLODipine (NORVASC) 10 MG tablet TAKE 1 TABLET BY MOUTH EVERY DAY 90 tablet 3   aspirin 81 MG chewable tablet Chew 1 tablet (81 mg total) by mouth daily. 30 tablet 0   atorvastatin (LIPITOR) 40 MG tablet TAKE 1 TABLET BY MOUTH EVERY DAY 90 tablet 1   B Complex-C-Zn-Folic Acid (DIALYVITE/ZINC) TABS Take 1 tablet by mouth daily.     calcitRIOL (ROCALTROL) 0.5 MCG capsule Take 1.5 mcg by mouth daily.     calcium acetate (PHOSLO) 667 MG capsule Take 667 mg by mouth 3 (three) times daily with meals.  10   clopidogrel (PLAVIX) 75 MG tablet TAKE 1 TABLET (75 MG TOTAL) BY MOUTH DAILY WITH BREAKFAST. 90 tablet 3   cyclobenzaprine (FLEXERIL) 10  MG tablet Take 1 tablet (10 mg total) by mouth 2 (two) times daily as needed. 60 tablet 1   Fluticasone-Salmeterol (ADVAIR) 250-50 MCG/DOSE AEPB Inhale 1 puff into the lungs 2 (two) times daily. 60 each 3   HYDROcodone-acetaminophen (NORCO) 10-325 MG tablet Take 0.5 tablets by mouth every 8 (eight) hours as needed for severe pain. 8 tablet 0   ipratropium-albuterol (DUONEB) 0.5-2.5 (3) MG/3ML SOLN Take 3 mLs by nebulization every 6 (six) hours as needed. 360 mL 2   iron sucrose (VENOFER) 20 MG/ML injection Iron Sucrose (Venofer)     Methoxy PEG-Epoetin Beta (MIRCERA IJ) Inject into the skin.     metoprolol succinate (TOPROL-XL) 50 MG 24 hr tablet TAKE 1.5 TABLETS BY MOUTH DAILY 135 tablet 3   Misc. Devices MISC Electric scooter for mobility 1 each 0   montelukast (SINGULAIR) 10 MG tablet Take 1 tablet (10 mg total) by mouth at bedtime. 30 tablet 5   ondansetron (ZOFRAN ODT) 4 MG disintegrating tablet Take 1 tablet (4 mg total) by mouth every 8 (eight) hours as needed for nausea or vomiting. 20 tablet 0   oxyCODONE-acetaminophen (PERCOCET/ROXICET) 5-325 MG tablet Take 1 tablet by mouth every 8 (eight) hours as needed for severe pain. 21 tablet 0   pantoprazole (PROTONIX) 40 MG tablet Take 1 tablet (40 mg total) by mouth daily. 30 tablet 0   PARoxetine (PAXIL) 20 MG tablet Take 1 tablet (20 mg total) by mouth daily. For hot flashes 30 tablet 3   PRALUENT 75 MG/ML SOAJ INJECT 75 MG INTO THE SKIN EVERY 14 (FOURTEEN) DAYS. 2 mL 11   promethazine (PHENERGAN) 25 MG tablet Take 1 tablet (25 mg total) by mouth every 8 (eight) hours as needed for nausea or vomiting. 30 tablet 1   sucralfate (CARAFATE) 1 GM/10ML suspension Take 10 mLs (1 g total) by mouth 4 (four) times daily -  with meals and at bedtime. 420 mL 0   hydrALAZINE (APRESOLINE) 50 MG tablet Take 50 mg by mouth every 8 (eight) hours. (Patient not taking: Reported on 08/04/2021)     isosorbide mononitrate (IMDUR) 30 MG 24 hr tablet TAKE 0.5 TABLETS  (15 MG TOTAL) BY MOUTH DAILY. 45 tablet 3   nitroGLYCERIN (NITROSTAT) 0.4 MG SL tablet Place 1 tablet (0.4 mg total) under the tongue every 5 (five) minutes as needed for chest pain. (Patient not taking: Reported on 08/04/2021) 25 tablet 2   No current facility-administered medications for this visit.  REVIEW OF SYSTEMS:  '[X]'$  denotes positive finding, '[ ]'$  denotes negative finding Cardiac  Comments:  Chest pain or chest pressure:    Shortness of breath upon exertion:    Short of breath when lying flat:    Irregular heart rhythm:        Vascular    Pain in calf, thigh, or hip brought on by ambulation:    Pain in feet at night that wakes you up from your sleep:  x Left foot  Blood clot in your veins:    Leg swelling:         Pulmonary    Oxygen at home:    Productive cough:     Wheezing:         Neurologic    Sudden weakness in arms or legs:     Sudden numbness in arms or legs:     Sudden onset of difficulty speaking or slurred speech:    Temporary loss of vision in one eye:     Problems with dizziness:         Gastrointestinal    Blood in stool:     Vomited blood:         Genitourinary    Burning when urinating:     Blood in urine:        Psychiatric    Major depression:         Hematologic    Bleeding problems:    Problems with blood clotting too easily:        Skin    Rashes or ulcers:        Constitutional    Fever or chills:      PHYSICAL EXAM: Vitals:   10/27/21 0912  BP: (!) 155/73  Pulse: 62  Resp: 16  Temp: 97.6 F (36.4 C)  TempSrc: Temporal  SpO2: 97%  Weight: 133 lb (60.3 kg)  Height: '5\' 5"'$  (1.651 m)    GENERAL: The patient is a well-nourished female, in no acute distress. The vital signs are documented above. CARDIAC: There is a regular rate and rhythm.  VASCULAR:  Bilateral femoral pulses palpable No palpable pedal pulses Left PT monophasic but brisk No lower extremity tissue loss PULMONARY: No respiratory distress. ABDOMEN: Soft  and non-tender. MUSCULOSKELETAL: There are no major deformities or cyanosis. NEUROLOGIC: No focal weakness or paresthesias are detected.  DATA:   Aortoiliac duplex today shows evidence of recurrent stenosis in the right common iliac and external iliac artery with velocities of 247 (stable over past 3 months).  She also has evidence of stenosis in the left common iliac artery with a velocity of 243 (previously 342) and distal left external iliac artery.  High-grade recurrent stenosis in proximal left SFA with a velocity of 659.  Assessment/Plan:  51 year old female with multiple medical issues including end-stage renal disease that presents for interval 2-monthfollow-up of her PAD.  She has had multiple iliac as well as bilateral SFA interventions for lifestyle limiting claudication.  [facility]  Since that time has been having increasing pain in the left foot that she states is all the time.  Given evidence of recurrent stenosis including a high-grade stenosis in the proximal SFA with a velocity of 659, I have recommended an aortogram with lower extremity arteriogram to evaluate her inflow with possible intervention.  We will schedule for 11/12/21.  Risk and benefits discussed.  CMarty Heck MD  [facility] Office: [phone]

## 2021-10-30 DIAGNOSIS — D631 Anemia in chronic kidney disease: Secondary | ICD-10-CM | POA: Diagnosis not present

## 2021-10-30 DIAGNOSIS — N2581 Secondary hyperparathyroidism of renal origin: Secondary | ICD-10-CM | POA: Diagnosis not present

## 2021-10-30 DIAGNOSIS — L299 Pruritus, unspecified: Secondary | ICD-10-CM | POA: Diagnosis not present

## 2021-10-30 DIAGNOSIS — D509 Iron deficiency anemia, unspecified: Secondary | ICD-10-CM | POA: Diagnosis not present

## 2021-10-30 DIAGNOSIS — Z992 Dependence on renal dialysis: Secondary | ICD-10-CM | POA: Diagnosis not present

## 2021-10-30 DIAGNOSIS — N186 End stage renal disease: Secondary | ICD-10-CM | POA: Diagnosis not present

## 2021-11-02 DIAGNOSIS — N2581 Secondary hyperparathyroidism of renal origin: Secondary | ICD-10-CM | POA: Diagnosis not present

## 2021-11-02 DIAGNOSIS — N186 End stage renal disease: Secondary | ICD-10-CM | POA: Diagnosis not present

## 2021-11-02 DIAGNOSIS — Z992 Dependence on renal dialysis: Secondary | ICD-10-CM | POA: Diagnosis not present

## 2021-11-02 DIAGNOSIS — D631 Anemia in chronic kidney disease: Secondary | ICD-10-CM | POA: Diagnosis not present

## 2021-11-02 DIAGNOSIS — L299 Pruritus, unspecified: Secondary | ICD-10-CM | POA: Diagnosis not present

## 2021-11-02 DIAGNOSIS — D509 Iron deficiency anemia, unspecified: Secondary | ICD-10-CM | POA: Diagnosis not present

## 2021-11-04 DIAGNOSIS — D509 Iron deficiency anemia, unspecified: Secondary | ICD-10-CM | POA: Diagnosis not present

## 2021-11-04 DIAGNOSIS — N186 End stage renal disease: Secondary | ICD-10-CM | POA: Diagnosis not present

## 2021-11-04 DIAGNOSIS — L299 Pruritus, unspecified: Secondary | ICD-10-CM | POA: Diagnosis not present

## 2021-11-04 DIAGNOSIS — D631 Anemia in chronic kidney disease: Secondary | ICD-10-CM | POA: Diagnosis not present

## 2021-11-04 DIAGNOSIS — N2581 Secondary hyperparathyroidism of renal origin: Secondary | ICD-10-CM | POA: Diagnosis not present

## 2021-11-04 DIAGNOSIS — Z992 Dependence on renal dialysis: Secondary | ICD-10-CM | POA: Diagnosis not present

## 2021-11-05 ENCOUNTER — Encounter: Payer: Self-pay | Admitting: Physician Assistant

## 2021-11-05 ENCOUNTER — Ambulatory Visit (HOSPITAL_BASED_OUTPATIENT_CLINIC_OR_DEPARTMENT_OTHER): Payer: Medicare Other | Admitting: Physician Assistant

## 2021-11-05 VITALS — BP 156/78 | HR 105 | Ht 65.0 in | Wt 132.0 lb

## 2021-11-05 DIAGNOSIS — E1143 Type 2 diabetes mellitus with diabetic autonomic (poly)neuropathy: Secondary | ICD-10-CM | POA: Diagnosis present

## 2021-11-05 DIAGNOSIS — J9801 Acute bronchospasm: Secondary | ICD-10-CM | POA: Insufficient documentation

## 2021-11-05 DIAGNOSIS — L97129 Non-pressure chronic ulcer of left thigh with unspecified severity: Secondary | ICD-10-CM | POA: Diagnosis not present

## 2021-11-05 DIAGNOSIS — T82898A Other specified complication of vascular prosthetic devices, implants and grafts, initial encounter: Secondary | ICD-10-CM | POA: Diagnosis not present

## 2021-11-05 DIAGNOSIS — H903 Sensorineural hearing loss, bilateral: Secondary | ICD-10-CM | POA: Diagnosis not present

## 2021-11-05 DIAGNOSIS — L89152 Pressure ulcer of sacral region, stage 2: Secondary | ICD-10-CM | POA: Diagnosis present

## 2021-11-05 DIAGNOSIS — I071 Rheumatic tricuspid insufficiency: Secondary | ICD-10-CM | POA: Diagnosis not present

## 2021-11-05 DIAGNOSIS — R4182 Altered mental status, unspecified: Secondary | ICD-10-CM | POA: Diagnosis not present

## 2021-11-05 DIAGNOSIS — G8929 Other chronic pain: Secondary | ICD-10-CM | POA: Diagnosis not present

## 2021-11-05 DIAGNOSIS — E1152 Type 2 diabetes mellitus with diabetic peripheral angiopathy with gangrene: Secondary | ICD-10-CM | POA: Diagnosis present

## 2021-11-05 DIAGNOSIS — I120 Hypertensive chronic kidney disease with stage 5 chronic kidney disease or end stage renal disease: Secondary | ICD-10-CM | POA: Diagnosis not present

## 2021-11-05 DIAGNOSIS — I5082 Biventricular heart failure: Secondary | ICD-10-CM | POA: Diagnosis not present

## 2021-11-05 DIAGNOSIS — K5900 Constipation, unspecified: Secondary | ICD-10-CM | POA: Diagnosis present

## 2021-11-05 DIAGNOSIS — R6520 Severe sepsis without septic shock: Secondary | ICD-10-CM | POA: Diagnosis not present

## 2021-11-05 DIAGNOSIS — Z992 Dependence on renal dialysis: Secondary | ICD-10-CM | POA: Diagnosis not present

## 2021-11-05 DIAGNOSIS — M79604 Pain in right leg: Secondary | ICD-10-CM | POA: Diagnosis not present

## 2021-11-05 DIAGNOSIS — E1122 Type 2 diabetes mellitus with diabetic chronic kidney disease: Secondary | ICD-10-CM | POA: Insufficient documentation

## 2021-11-05 DIAGNOSIS — I70262 Atherosclerosis of native arteries of extremities with gangrene, left leg: Secondary | ICD-10-CM | POA: Diagnosis present

## 2021-11-05 DIAGNOSIS — A419 Sepsis, unspecified organism: Secondary | ICD-10-CM | POA: Diagnosis not present

## 2021-11-05 DIAGNOSIS — J168 Pneumonia due to other specified infectious organisms: Secondary | ICD-10-CM | POA: Diagnosis not present

## 2021-11-05 DIAGNOSIS — R059 Cough, unspecified: Secondary | ICD-10-CM | POA: Insufficient documentation

## 2021-11-05 DIAGNOSIS — E872 Acidosis, unspecified: Secondary | ICD-10-CM | POA: Diagnosis not present

## 2021-11-05 DIAGNOSIS — M79605 Pain in left leg: Secondary | ICD-10-CM | POA: Diagnosis not present

## 2021-11-05 DIAGNOSIS — N2581 Secondary hyperparathyroidism of renal origin: Secondary | ICD-10-CM | POA: Diagnosis present

## 2021-11-05 DIAGNOSIS — N186 End stage renal disease: Secondary | ICD-10-CM | POA: Insufficient documentation

## 2021-11-05 DIAGNOSIS — K219 Gastro-esophageal reflux disease without esophagitis: Secondary | ICD-10-CM | POA: Diagnosis present

## 2021-11-05 DIAGNOSIS — G4700 Insomnia, unspecified: Secondary | ICD-10-CM | POA: Diagnosis not present

## 2021-11-05 DIAGNOSIS — L0390 Cellulitis, unspecified: Secondary | ICD-10-CM | POA: Diagnosis not present

## 2021-11-05 DIAGNOSIS — D72823 Leukemoid reaction: Secondary | ICD-10-CM | POA: Diagnosis not present

## 2021-11-05 DIAGNOSIS — G253 Myoclonus: Secondary | ICD-10-CM | POA: Diagnosis present

## 2021-11-05 DIAGNOSIS — G928 Other toxic encephalopathy: Secondary | ICD-10-CM | POA: Diagnosis not present

## 2021-11-05 DIAGNOSIS — N179 Acute kidney failure, unspecified: Secondary | ICD-10-CM | POA: Diagnosis present

## 2021-11-05 DIAGNOSIS — S0990XA Unspecified injury of head, initial encounter: Secondary | ICD-10-CM | POA: Diagnosis not present

## 2021-11-05 DIAGNOSIS — R053 Chronic cough: Secondary | ICD-10-CM

## 2021-11-05 DIAGNOSIS — R531 Weakness: Secondary | ICD-10-CM | POA: Diagnosis not present

## 2021-11-05 DIAGNOSIS — Z86718 Personal history of other venous thrombosis and embolism: Secondary | ICD-10-CM | POA: Diagnosis not present

## 2021-11-05 DIAGNOSIS — I361 Nonrheumatic tricuspid (valve) insufficiency: Secondary | ICD-10-CM | POA: Diagnosis present

## 2021-11-05 DIAGNOSIS — Z9049 Acquired absence of other specified parts of digestive tract: Secondary | ICD-10-CM | POA: Diagnosis not present

## 2021-11-05 DIAGNOSIS — I2510 Atherosclerotic heart disease of native coronary artery without angina pectoris: Secondary | ICD-10-CM | POA: Diagnosis present

## 2021-11-05 DIAGNOSIS — R600 Localized edema: Secondary | ICD-10-CM | POA: Diagnosis not present

## 2021-11-05 DIAGNOSIS — D72829 Elevated white blood cell count, unspecified: Secondary | ICD-10-CM | POA: Diagnosis not present

## 2021-11-05 DIAGNOSIS — I38 Endocarditis, valve unspecified: Secondary | ICD-10-CM | POA: Diagnosis not present

## 2021-11-05 DIAGNOSIS — I5041 Acute combined systolic (congestive) and diastolic (congestive) heart failure: Secondary | ICD-10-CM | POA: Diagnosis not present

## 2021-11-05 DIAGNOSIS — J189 Pneumonia, unspecified organism: Secondary | ICD-10-CM | POA: Diagnosis not present

## 2021-11-05 DIAGNOSIS — R509 Fever, unspecified: Secondary | ICD-10-CM | POA: Diagnosis not present

## 2021-11-05 DIAGNOSIS — E1142 Type 2 diabetes mellitus with diabetic polyneuropathy: Secondary | ICD-10-CM | POA: Diagnosis present

## 2021-11-05 DIAGNOSIS — I70222 Atherosclerosis of native arteries of extremities with rest pain, left leg: Secondary | ICD-10-CM | POA: Diagnosis not present

## 2021-11-05 DIAGNOSIS — G9341 Metabolic encephalopathy: Secondary | ICD-10-CM | POA: Diagnosis not present

## 2021-11-05 DIAGNOSIS — R188 Other ascites: Secondary | ICD-10-CM | POA: Diagnosis not present

## 2021-11-05 DIAGNOSIS — I708 Atherosclerosis of other arteries: Secondary | ICD-10-CM | POA: Diagnosis present

## 2021-11-05 DIAGNOSIS — Z79899 Other long term (current) drug therapy: Secondary | ICD-10-CM | POA: Diagnosis not present

## 2021-11-05 DIAGNOSIS — R29818 Other symptoms and signs involving the nervous system: Secondary | ICD-10-CM | POA: Diagnosis not present

## 2021-11-05 DIAGNOSIS — R6521 Severe sepsis with septic shock: Secondary | ICD-10-CM | POA: Diagnosis not present

## 2021-11-05 DIAGNOSIS — I272 Pulmonary hypertension, unspecified: Secondary | ICD-10-CM | POA: Diagnosis present

## 2021-11-05 DIAGNOSIS — J45909 Unspecified asthma, uncomplicated: Secondary | ICD-10-CM | POA: Diagnosis present

## 2021-11-05 DIAGNOSIS — Z87891 Personal history of nicotine dependence: Secondary | ICD-10-CM | POA: Diagnosis not present

## 2021-11-05 DIAGNOSIS — M25572 Pain in left ankle and joints of left foot: Secondary | ICD-10-CM | POA: Diagnosis not present

## 2021-11-05 DIAGNOSIS — I739 Peripheral vascular disease, unspecified: Secondary | ICD-10-CM | POA: Diagnosis not present

## 2021-11-05 DIAGNOSIS — I10 Essential (primary) hypertension: Secondary | ICD-10-CM | POA: Diagnosis not present

## 2021-11-05 DIAGNOSIS — Z1152 Encounter for screening for COVID-19: Secondary | ICD-10-CM | POA: Diagnosis not present

## 2021-11-05 DIAGNOSIS — I5042 Chronic combined systolic (congestive) and diastolic (congestive) heart failure: Secondary | ICD-10-CM | POA: Diagnosis present

## 2021-11-05 DIAGNOSIS — W19XXXA Unspecified fall, initial encounter: Secondary | ICD-10-CM | POA: Diagnosis not present

## 2021-11-05 DIAGNOSIS — Z8616 Personal history of COVID-19: Secondary | ICD-10-CM | POA: Diagnosis not present

## 2021-11-05 DIAGNOSIS — R112 Nausea with vomiting, unspecified: Secondary | ICD-10-CM | POA: Diagnosis not present

## 2021-11-05 DIAGNOSIS — Y831 Surgical operation with implant of artificial internal device as the cause of abnormal reaction of the patient, or of later complication, without mention of misadventure at the time of the procedure: Secondary | ICD-10-CM | POA: Diagnosis not present

## 2021-11-05 DIAGNOSIS — I132 Hypertensive heart and chronic kidney disease with heart failure and with stage 5 chronic kidney disease, or end stage renal disease: Secondary | ICD-10-CM | POA: Diagnosis not present

## 2021-11-05 DIAGNOSIS — I96 Gangrene, not elsewhere classified: Secondary | ICD-10-CM | POA: Diagnosis not present

## 2021-11-05 DIAGNOSIS — J811 Chronic pulmonary edema: Secondary | ICD-10-CM | POA: Diagnosis not present

## 2021-11-05 DIAGNOSIS — D631 Anemia in chronic kidney disease: Secondary | ICD-10-CM | POA: Diagnosis not present

## 2021-11-05 DIAGNOSIS — E875 Hyperkalemia: Secondary | ICD-10-CM | POA: Diagnosis not present

## 2021-11-05 DIAGNOSIS — Z043 Encounter for examination and observation following other accident: Secondary | ICD-10-CM | POA: Diagnosis not present

## 2021-11-05 DIAGNOSIS — L03116 Cellulitis of left lower limb: Secondary | ICD-10-CM | POA: Diagnosis present

## 2021-11-05 DIAGNOSIS — R5381 Other malaise: Secondary | ICD-10-CM | POA: Diagnosis not present

## 2021-11-05 DIAGNOSIS — E11649 Type 2 diabetes mellitus with hypoglycemia without coma: Secondary | ICD-10-CM | POA: Diagnosis not present

## 2021-11-05 DIAGNOSIS — R000 Tachycardia, unspecified: Secondary | ICD-10-CM | POA: Diagnosis not present

## 2021-11-05 DIAGNOSIS — I6381 Other cerebral infarction due to occlusion or stenosis of small artery: Secondary | ICD-10-CM | POA: Diagnosis not present

## 2021-11-05 DIAGNOSIS — D649 Anemia, unspecified: Secondary | ICD-10-CM | POA: Diagnosis not present

## 2021-11-05 MED ORDER — MONTELUKAST SODIUM 10 MG PO TABS: 10.0000 mg | ORAL_TABLET | Freq: Every day | ORAL | 1 refills | Status: AC

## 2021-11-05 MED ORDER — FLUTICASONE FUROATE-VILANTEROL 200-25 MCG/ACT IN AEPB: 1.0000 | INHALATION_SPRAY | Freq: Every day | RESPIRATORY_TRACT | 3 refills | Status: AC

## 2021-11-05 NOTE — Progress Notes (Signed)
Patient ID: Susan Horton, female   DOB: 03-21-1970, 51 y.o.   MRN: [id number]   Susan Horton, is a 51 y.o. female  NGE:[id number]  MWN:[id number]  DOB - 1971-01-04  Chief Complaint  Patient presents with   Cough       Subjective:   Susan Horton is a 51 y.o. female here today with ESRD On HD M,W,F.  Has long standing cough and wheezing and wants to see a specialist.  She is immediately in a hurry and says she needs to get home.  She was seen at her scheduled appt time.  Cough is not productive.  She has not been taking singulair or using advair.  No fever.      No problems updated.  ALLERGIES: Allergies  Allergen Reactions   Penicillins Anaphylaxis, Itching, Swelling, Rash and Other (See Comments)    Swelling of throat & whole mouth  Has patient had a PCN reaction causing immediate rash, facial/tongue/throat swelling, SOB or lightheadedness with hypotension: Yes Has patient had a PCN reaction causing severe rash involving mucus membranes or skin necrosis: No Has patient had a PCN reaction that required hospitalization: No Has patient had a PCN reaction occurring within the last 10 years: No If all of the above answers are "NO", then may proceed with Cephalosporin use.   Repatha [Evolocumab] Itching   Lisinopril Cough    PAST MEDICAL HISTORY: Past Medical History:  Diagnosis Date   Abnormal stress test    Anemia    Angio-edema    Asthma    CAD (coronary artery disease)    DES to mid LAD July 2018, residual moderate RCA disease   Cataract    CKD (chronic kidney disease) stage 4, GFR 15-29 ml/min (HCC)    Dialysis T/Th/Sa   DVT (deep venous thrombosis) ([place])    1996 during pregnancy, 2015 left leg   Eczema    Essential hypertension 12/11/2015   Gastroparesis    GERD (gastroesophageal reflux disease)    Gluteal abscess 12/27/2018   Hidradenitis    Migraine    Neuropathy    Peripheral vascular disease (HCC)    blood clot in leg   Progressive angina ([place]) 06/05/2014    Chest pain   Type 2 diabetes mellitus ([place])    Type II diabetes mellitus ([place])    Urticaria     MEDICATIONS AT HOME: Prior to Admission medications   Medication Sig Start Date End Date Taking? Authorizing Provider  acetaminophen-codeine (TYLENOL #3) 300-30 MG tablet Take 1 tablet by mouth at bedtime as needed for moderate pain. 05/11/21  Yes Newt Minion, MD  albuterol (VENTOLIN HFA) 108 (90 Base) MCG/ACT inhaler Inhale 1-2 puffs into the lungs every 6 (six) hours as needed for wheezing or shortness of breath. 07/02/20  Yes Mariel Aloe, MD  amLODipine (NORVASC) 10 MG tablet TAKE 1 TABLET BY MOUTH EVERY DAY 09/10/21  Yes [name], MD  aspirin 81 MG chewable tablet Chew 1 tablet (81 mg total) by mouth daily. 08/07/16  Yes Reino Bellis B, NP  atorvastatin (LIPITOR) 40 MG tablet TAKE 1 TABLET BY MOUTH EVERY DAY 12/22/20  Yes [name], MD  B Complex-C-Zn-Folic Acid (DIALYVITE/ZINC) TABS Take 1 tablet by mouth daily. 03/25/20  Yes [provider]  calcitRIOL (ROCALTROL) 0.5 MCG capsule Take 1.5 mcg by mouth daily. 09/11/18  Yes [provider]  calcium acetate (PHOSLO) 667 MG capsule Take 667 mg by mouth 3 (three) times daily with meals. 09/23/17  Yes [provider]  clopidogrel (PLAVIX) 75 MG tablet TAKE 1 TABLET (75 MG TOTAL) BY MOUTH DAILY WITH BREAKFAST. 07/28/20  Yes [name], MD  cyclobenzaprine (FLEXERIL) 10 MG tablet Take 1 tablet (10 mg total) by mouth 2 (two) times daily as needed. 08/06/20  Yes Newlin, Enobong, MD  fluticasone furoate-vilanterol (BREO ELLIPTA) 200-25 MCG/ACT AEPB Inhale 1 puff into the lungs daily. 11/05/21  Yes Argentina Donovan, PA-C  hydrALAZINE (APRESOLINE) 50 MG tablet Take 50 mg by mouth every 8 (eight) hours. 06/18/20  Yes [provider]  HYDROcodone-acetaminophen (NORCO) 10-325 MG tablet Take 0.5 tablets by mouth every 8 (eight) hours as needed for severe pain. 05/10/21  Yes Harris, Abigail, PA-C   ipratropium-albuterol (DUONEB) 0.5-2.5 (3) MG/3ML SOLN Take 3 mLs by nebulization every 6 (six) hours as needed. 07/02/20  Yes Mariel Aloe, MD  iron sucrose (VENOFER) 20 MG/ML injection Iron Sucrose (Venofer) 07/11/19  Yes [provider]  Methoxy PEG-Epoetin Beta (MIRCERA IJ) Inject into the skin. 07/09/19  Yes [provider]  metoprolol succinate (TOPROL-XL) 50 MG 24 hr tablet TAKE 1.5 TABLETS BY MOUTH DAILY 07/28/20  Yes [name], MD  Misc. Devices MISC Electric scooter for mobility 06/24/21  Yes Newlin, Charlane Ferretti, MD  nitroGLYCERIN (NITROSTAT) 0.4 MG SL tablet Place 1 tablet (0.4 mg total) under the tongue every 5 (five) minutes as needed for chest pain. 08/06/19  Yes [name], MD  ondansetron (ZOFRAN ODT) 4 MG disintegrating tablet Take 1 tablet (4 mg total) by mouth every 8 (eight) hours as needed for nausea or vomiting. 07/02/20  Yes Mariel Aloe, MD  oxyCODONE-acetaminophen (PERCOCET/ROXICET) 5-325 MG tablet Take 1 tablet by mouth every 8 (eight) hours as needed for severe pain. 08/05/21  Yes Dondra Prader R, NP  pantoprazole (PROTONIX) 40 MG tablet Take 1 tablet (40 mg total) by mouth daily. 07/02/20  Yes Mariel Aloe, MD  PARoxetine (PAXIL) 20 MG tablet Take 1 tablet (20 mg total) by mouth daily. For hot flashes 07/30/21  Yes Newlin, Enobong, MD  PRALUENT 75 MG/ML SOAJ INJECT 75 MG INTO THE SKIN EVERY 14 (FOURTEEN) DAYS. 02/07/20  Yes [name], MD  promethazine (PHENERGAN) 25 MG tablet Take 1 tablet (25 mg total) by mouth every 8 (eight) hours as needed for nausea or vomiting. 07/30/21  Yes Newlin, Enobong, MD  sucralfate (CARAFATE) 1 GM/10ML suspension Take 10 mLs (1 g total) by mouth 4 (four) times daily -  with meals and at bedtime. 03/22/20  Yes Jacqlyn Larsen, PA-C  isosorbide mononitrate (IMDUR) 30 MG 24 hr tablet TAKE 0.5 TABLETS (15 MG TOTAL) BY MOUTH DAILY. 07/28/20 10/26/20  [name], MD  montelukast (SINGULAIR) 10 MG tablet Take 1  tablet (10 mg total) by mouth at bedtime. 11/05/21   Daphanie Oquendo, Dionne Bucy, PA-C    ROS: Neg HEENT Neg cardiac Neg GI Neg GU Neg MS Neg psych Neg neuro  Objective:   Vitals:   11/05/21 1410  BP: (!) 156/78  Pulse: (!) 105  SpO2: 99%  Weight: 132 lb (59.9 kg)  Height: '5\' 5"'$  (1.651 m)   Exam General appearance : Awake, alert, not in any distress. Speech Clear. Not toxic looking;  appears older than stated age and frail HEENT: Atraumatic and Normocephalic Neck: Supple, no JVD. No cervical lymphadenopathy.  Chest: fair air entry bilaterally,  No rales/rhonchi.  There is mild wheezing throughout CVS: S1 S2 regular, no murmurs.  Extremities: B/L Lower Ext  shows no edema, both legs are warm to touch Neurology: Awake alert, and oriented X 3, CN II-XII intact, Non focal Skin: No Rash  Data Review Lab Results  Component Value Date   HGBA1C 5.3 07/30/2021   HGBA1C 4.9 06/30/2020   HGBA1C 7.5 (H) 12/26/2018    Assessment & Plan   1. Chronic cough - fluticasone furoate-vilanterol (BREO ELLIPTA) 200-25 MCG/ACT AEPB; Inhale 1 puff into the lungs daily.  Dispense: 60 each; Refill: 3 - montelukast (SINGULAIR) 10 MG tablet; Take 1 tablet (10 mg total) by mouth at bedtime.  Dispense: 90 tablet; Refill: 1 - Ambulatory referral to Pulmonology  2. Bronchospasm - fluticasone furoate-vilanterol (BREO ELLIPTA) 200-25 MCG/ACT AEPB; Inhale 1 puff into the lungs daily.  Dispense: 60 each; Refill: 3 - montelukast (SINGULAIR) 10 MG tablet; Take 1 tablet (10 mg total) by mouth at bedtime.  Dispense: 90 tablet; Refill: 1 - Ambulatory referral to Pulmonology    Return in about 4 months (around 03/08/2022) for PCP for chronic conditions.  The patient was given clear instructions to go to ER or return to medical center if symptoms don't improve, worsen or new problems develop. The patient verbalized understanding. The patient was told to call to get lab results if they haven't heard anything in the  next week.      Freeman Caldron, PA-C [facility] and [facility] [place], [place]   11/05/2021, 2:49 PM

## 2021-11-08 ENCOUNTER — Inpatient Hospital Stay (HOSPITAL_COMMUNITY)
Admission: EM | Admit: 2021-11-08 | Discharge: 2021-11-23 | DRG: 853 | Disposition: A | Discharge status: Rehabilitation Facility | Payer: Medicare Other | Attending: Family Medicine | Admitting: Family Medicine

## 2021-11-08 ENCOUNTER — Encounter (HOSPITAL_COMMUNITY): Payer: Self-pay | Admitting: Emergency Medicine

## 2021-11-08 ENCOUNTER — Emergency Department (HOSPITAL_COMMUNITY): Payer: Medicare Other

## 2021-11-08 DIAGNOSIS — E11649 Type 2 diabetes mellitus with hypoglycemia without coma: Secondary | ICD-10-CM | POA: Diagnosis not present

## 2021-11-08 DIAGNOSIS — Z888 Allergy status to other drugs, medicaments and biological substances status: Secondary | ICD-10-CM

## 2021-11-08 DIAGNOSIS — L8990 Pressure ulcer of unspecified site, unspecified stage: Secondary | ICD-10-CM | POA: Insufficient documentation

## 2021-11-08 DIAGNOSIS — I2510 Atherosclerotic heart disease of native coronary artery without angina pectoris: Secondary | ICD-10-CM | POA: Diagnosis present

## 2021-11-08 DIAGNOSIS — R600 Localized edema: Secondary | ICD-10-CM | POA: Diagnosis not present

## 2021-11-08 DIAGNOSIS — M79604 Pain in right leg: Secondary | ICD-10-CM | POA: Diagnosis not present

## 2021-11-08 DIAGNOSIS — R4182 Altered mental status, unspecified: Secondary | ICD-10-CM | POA: Diagnosis not present

## 2021-11-08 DIAGNOSIS — Z79899 Other long term (current) drug therapy: Secondary | ICD-10-CM | POA: Diagnosis not present

## 2021-11-08 DIAGNOSIS — R112 Nausea with vomiting, unspecified: Secondary | ICD-10-CM | POA: Diagnosis not present

## 2021-11-08 DIAGNOSIS — R509 Fever, unspecified: Secondary | ICD-10-CM | POA: Diagnosis not present

## 2021-11-08 DIAGNOSIS — Z043 Encounter for examination and observation following other accident: Secondary | ICD-10-CM | POA: Diagnosis not present

## 2021-11-08 DIAGNOSIS — Z8616 Personal history of COVID-19: Secondary | ICD-10-CM

## 2021-11-08 DIAGNOSIS — Z6823 Body mass index (BMI) 23.0-23.9, adult: Secondary | ICD-10-CM

## 2021-11-08 DIAGNOSIS — N179 Acute kidney failure, unspecified: Secondary | ICD-10-CM | POA: Diagnosis present

## 2021-11-08 DIAGNOSIS — J189 Pneumonia, unspecified organism: Secondary | ICD-10-CM

## 2021-11-08 DIAGNOSIS — Z87891 Personal history of nicotine dependence: Secondary | ICD-10-CM | POA: Diagnosis not present

## 2021-11-08 DIAGNOSIS — H903 Sensorineural hearing loss, bilateral: Secondary | ICD-10-CM | POA: Diagnosis present

## 2021-11-08 DIAGNOSIS — N2581 Secondary hyperparathyroidism of renal origin: Secondary | ICD-10-CM | POA: Diagnosis present

## 2021-11-08 DIAGNOSIS — R6521 Severe sepsis with septic shock: Secondary | ICD-10-CM | POA: Diagnosis present

## 2021-11-08 DIAGNOSIS — E1165 Type 2 diabetes mellitus with hyperglycemia: Secondary | ICD-10-CM | POA: Diagnosis present

## 2021-11-08 DIAGNOSIS — G8929 Other chronic pain: Secondary | ICD-10-CM | POA: Diagnosis not present

## 2021-11-08 DIAGNOSIS — I5042 Chronic combined systolic (congestive) and diastolic (congestive) heart failure: Secondary | ICD-10-CM | POA: Diagnosis present

## 2021-11-08 DIAGNOSIS — I4891 Unspecified atrial fibrillation: Secondary | ICD-10-CM | POA: Diagnosis not present

## 2021-11-08 DIAGNOSIS — L03116 Cellulitis of left lower limb: Secondary | ICD-10-CM | POA: Diagnosis present

## 2021-11-08 DIAGNOSIS — L0390 Cellulitis, unspecified: Secondary | ICD-10-CM | POA: Diagnosis not present

## 2021-11-08 DIAGNOSIS — L97129 Non-pressure chronic ulcer of left thigh with unspecified severity: Secondary | ICD-10-CM | POA: Diagnosis not present

## 2021-11-08 DIAGNOSIS — J9801 Acute bronchospasm: Secondary | ICD-10-CM | POA: Diagnosis present

## 2021-11-08 DIAGNOSIS — Z955 Presence of coronary angioplasty implant and graft: Secondary | ICD-10-CM

## 2021-11-08 DIAGNOSIS — M25572 Pain in left ankle and joints of left foot: Secondary | ICD-10-CM | POA: Diagnosis not present

## 2021-11-08 DIAGNOSIS — D72829 Elevated white blood cell count, unspecified: Secondary | ICD-10-CM | POA: Diagnosis not present

## 2021-11-08 DIAGNOSIS — J45909 Unspecified asthma, uncomplicated: Secondary | ICD-10-CM | POA: Diagnosis present

## 2021-11-08 DIAGNOSIS — I361 Nonrheumatic tricuspid (valve) insufficiency: Secondary | ICD-10-CM | POA: Diagnosis present

## 2021-11-08 DIAGNOSIS — G928 Other toxic encephalopathy: Secondary | ICD-10-CM | POA: Diagnosis present

## 2021-11-08 DIAGNOSIS — I70222 Atherosclerosis of native arteries of extremities with rest pain, left leg: Secondary | ICD-10-CM | POA: Diagnosis present

## 2021-11-08 DIAGNOSIS — R000 Tachycardia, unspecified: Secondary | ICD-10-CM | POA: Diagnosis not present

## 2021-11-08 DIAGNOSIS — S7002XA Contusion of left hip, initial encounter: Secondary | ICD-10-CM | POA: Diagnosis present

## 2021-11-08 DIAGNOSIS — I272 Pulmonary hypertension, unspecified: Secondary | ICD-10-CM | POA: Diagnosis present

## 2021-11-08 DIAGNOSIS — R29818 Other symptoms and signs involving the nervous system: Secondary | ICD-10-CM | POA: Diagnosis not present

## 2021-11-08 DIAGNOSIS — R531 Weakness: Secondary | ICD-10-CM | POA: Diagnosis not present

## 2021-11-08 DIAGNOSIS — E1122 Type 2 diabetes mellitus with diabetic chronic kidney disease: Secondary | ICD-10-CM | POA: Diagnosis present

## 2021-11-08 DIAGNOSIS — D649 Anemia, unspecified: Secondary | ICD-10-CM | POA: Diagnosis not present

## 2021-11-08 DIAGNOSIS — N186 End stage renal disease: Secondary | ICD-10-CM | POA: Diagnosis present

## 2021-11-08 DIAGNOSIS — L89152 Pressure ulcer of sacral region, stage 2: Secondary | ICD-10-CM | POA: Diagnosis present

## 2021-11-08 DIAGNOSIS — I5041 Acute combined systolic (congestive) and diastolic (congestive) heart failure: Secondary | ICD-10-CM | POA: Diagnosis not present

## 2021-11-08 DIAGNOSIS — Z90710 Acquired absence of both cervix and uterus: Secondary | ICD-10-CM

## 2021-11-08 DIAGNOSIS — Z9049 Acquired absence of other specified parts of digestive tract: Secondary | ICD-10-CM

## 2021-11-08 DIAGNOSIS — G4700 Insomnia, unspecified: Secondary | ICD-10-CM | POA: Diagnosis not present

## 2021-11-08 DIAGNOSIS — I120 Hypertensive chronic kidney disease with stage 5 chronic kidney disease or end stage renal disease: Secondary | ICD-10-CM | POA: Diagnosis not present

## 2021-11-08 DIAGNOSIS — R188 Other ascites: Secondary | ICD-10-CM | POA: Diagnosis not present

## 2021-11-08 DIAGNOSIS — W19XXXA Unspecified fall, initial encounter: Secondary | ICD-10-CM | POA: Diagnosis present

## 2021-11-08 DIAGNOSIS — S0990XA Unspecified injury of head, initial encounter: Secondary | ICD-10-CM | POA: Diagnosis not present

## 2021-11-08 DIAGNOSIS — E1142 Type 2 diabetes mellitus with diabetic polyneuropathy: Secondary | ICD-10-CM | POA: Diagnosis present

## 2021-11-08 DIAGNOSIS — T82898A Other specified complication of vascular prosthetic devices, implants and grafts, initial encounter: Secondary | ICD-10-CM | POA: Diagnosis not present

## 2021-11-08 DIAGNOSIS — K5900 Constipation, unspecified: Secondary | ICD-10-CM | POA: Diagnosis present

## 2021-11-08 DIAGNOSIS — Z95828 Presence of other vascular implants and grafts: Secondary | ICD-10-CM

## 2021-11-08 DIAGNOSIS — D631 Anemia in chronic kidney disease: Secondary | ICD-10-CM | POA: Diagnosis present

## 2021-11-08 DIAGNOSIS — I38 Endocarditis, valve unspecified: Secondary | ICD-10-CM | POA: Diagnosis not present

## 2021-11-08 DIAGNOSIS — Z880 Allergy status to penicillin: Secondary | ICD-10-CM

## 2021-11-08 DIAGNOSIS — G253 Myoclonus: Secondary | ICD-10-CM | POA: Diagnosis present

## 2021-11-08 DIAGNOSIS — Z7951 Long term (current) use of inhaled steroids: Secondary | ICD-10-CM

## 2021-11-08 DIAGNOSIS — Z7982 Long term (current) use of aspirin: Secondary | ICD-10-CM

## 2021-11-08 DIAGNOSIS — Y831 Surgical operation with implant of artificial internal device as the cause of abnormal reaction of the patient, or of later complication, without mention of misadventure at the time of the procedure: Secondary | ICD-10-CM | POA: Diagnosis not present

## 2021-11-08 DIAGNOSIS — E872 Acidosis, unspecified: Secondary | ICD-10-CM | POA: Diagnosis present

## 2021-11-08 DIAGNOSIS — L732 Hidradenitis suppurativa: Secondary | ICD-10-CM | POA: Diagnosis present

## 2021-11-08 DIAGNOSIS — E785 Hyperlipidemia, unspecified: Secondary | ICD-10-CM | POA: Diagnosis present

## 2021-11-08 DIAGNOSIS — I071 Rheumatic tricuspid insufficiency: Secondary | ICD-10-CM | POA: Diagnosis not present

## 2021-11-08 DIAGNOSIS — J811 Chronic pulmonary edema: Secondary | ICD-10-CM | POA: Diagnosis not present

## 2021-11-08 DIAGNOSIS — I6381 Other cerebral infarction due to occlusion or stenosis of small artery: Secondary | ICD-10-CM | POA: Diagnosis not present

## 2021-11-08 DIAGNOSIS — K3184 Gastroparesis: Secondary | ICD-10-CM | POA: Diagnosis present

## 2021-11-08 DIAGNOSIS — Z992 Dependence on renal dialysis: Secondary | ICD-10-CM

## 2021-11-08 DIAGNOSIS — Z8249 Family history of ischemic heart disease and other diseases of the circulatory system: Secondary | ICD-10-CM

## 2021-11-08 DIAGNOSIS — I5082 Biventricular heart failure: Secondary | ICD-10-CM | POA: Diagnosis present

## 2021-11-08 DIAGNOSIS — G9341 Metabolic encephalopathy: Secondary | ICD-10-CM | POA: Diagnosis present

## 2021-11-08 DIAGNOSIS — A419 Sepsis, unspecified organism: Principal | ICD-10-CM

## 2021-11-08 DIAGNOSIS — Z7902 Long term (current) use of antithrombotics/antiplatelets: Secondary | ICD-10-CM

## 2021-11-08 DIAGNOSIS — Z1152 Encounter for screening for COVID-19: Secondary | ICD-10-CM

## 2021-11-08 DIAGNOSIS — E875 Hyperkalemia: Secondary | ICD-10-CM | POA: Diagnosis present

## 2021-11-08 DIAGNOSIS — E1152 Type 2 diabetes mellitus with diabetic peripheral angiopathy with gangrene: Secondary | ICD-10-CM | POA: Diagnosis present

## 2021-11-08 DIAGNOSIS — M79605 Pain in left leg: Secondary | ICD-10-CM | POA: Diagnosis not present

## 2021-11-08 DIAGNOSIS — I739 Peripheral vascular disease, unspecified: Secondary | ICD-10-CM | POA: Diagnosis not present

## 2021-11-08 DIAGNOSIS — Z86718 Personal history of other venous thrombosis and embolism: Secondary | ICD-10-CM

## 2021-11-08 DIAGNOSIS — D72823 Leukemoid reaction: Secondary | ICD-10-CM | POA: Diagnosis not present

## 2021-11-08 DIAGNOSIS — E1151 Type 2 diabetes mellitus with diabetic peripheral angiopathy without gangrene: Secondary | ICD-10-CM | POA: Diagnosis present

## 2021-11-08 DIAGNOSIS — T426X5A Adverse effect of other antiepileptic and sedative-hypnotic drugs, initial encounter: Secondary | ICD-10-CM | POA: Diagnosis not present

## 2021-11-08 DIAGNOSIS — M21372 Foot drop, left foot: Secondary | ICD-10-CM | POA: Diagnosis not present

## 2021-11-08 DIAGNOSIS — E1143 Type 2 diabetes mellitus with diabetic autonomic (poly)neuropathy: Secondary | ICD-10-CM | POA: Diagnosis present

## 2021-11-08 DIAGNOSIS — R6520 Severe sepsis without septic shock: Secondary | ICD-10-CM | POA: Diagnosis not present

## 2021-11-08 DIAGNOSIS — I70262 Atherosclerosis of native arteries of extremities with gangrene, left leg: Secondary | ICD-10-CM | POA: Diagnosis present

## 2021-11-08 DIAGNOSIS — I132 Hypertensive heart and chronic kidney disease with heart failure and with stage 5 chronic kidney disease, or end stage renal disease: Secondary | ICD-10-CM | POA: Diagnosis present

## 2021-11-08 DIAGNOSIS — R5381 Other malaise: Secondary | ICD-10-CM | POA: Diagnosis present

## 2021-11-08 DIAGNOSIS — J168 Pneumonia due to other specified infectious organisms: Secondary | ICD-10-CM | POA: Diagnosis not present

## 2021-11-08 DIAGNOSIS — I10 Essential (primary) hypertension: Secondary | ICD-10-CM | POA: Diagnosis not present

## 2021-11-08 DIAGNOSIS — K219 Gastro-esophageal reflux disease without esophagitis: Secondary | ICD-10-CM | POA: Diagnosis present

## 2021-11-08 DIAGNOSIS — I708 Atherosclerosis of other arteries: Secondary | ICD-10-CM | POA: Diagnosis present

## 2021-11-08 DIAGNOSIS — E663 Overweight: Secondary | ICD-10-CM | POA: Diagnosis present

## 2021-11-08 DIAGNOSIS — I96 Gangrene, not elsewhere classified: Secondary | ICD-10-CM | POA: Diagnosis not present

## 2021-11-08 LAB — COMPREHENSIVE METABOLIC PANEL
ALT: 9 U/L (ref 0–44)
AST: 21 U/L (ref 15–41)
Albumin: 2.4 g/dL — ABNORMAL LOW (ref 3.5–5.0)
Alkaline Phosphatase: 171 U/L — ABNORMAL HIGH (ref 38–126)
Anion gap: 24 — ABNORMAL HIGH (ref 5–15)
BUN: 117 mg/dL — ABNORMAL HIGH (ref 6–20)
CO2: 17 mmol/L — ABNORMAL LOW (ref 22–32)
Calcium: 6.9 mg/dL — ABNORMAL LOW (ref 8.9–10.3)
Chloride: 97 mmol/L — ABNORMAL LOW (ref 98–111)
Creatinine, Ser: 10.68 mg/dL — ABNORMAL HIGH (ref 0.44–1.00)
GFR, Estimated: 4 mL/min — ABNORMAL LOW (ref >60)
Glucose, Bld: 92 mg/dL (ref 70–99)
Potassium: 6.9 mmol/L (ref 3.5–5.1)
Sodium: 138 mmol/L (ref 135–145)
Total Bilirubin: 0.6 mg/dL (ref 0.3–1.2)
Total Protein: 7.2 g/dL (ref 6.5–8.1)

## 2021-11-08 LAB — APTT: aPTT: 37 seconds — ABNORMAL HIGH (ref 24–36)

## 2021-11-08 LAB — RENAL FUNCTION PANEL
Albumin: 2.5 g/dL — ABNORMAL LOW (ref 3.5–5.0)
Anion gap: 20 — ABNORMAL HIGH (ref 5–15)
BUN: 113 mg/dL — ABNORMAL HIGH (ref 6–20)
CO2: 16 mmol/L — ABNORMAL LOW (ref 22–32)
Calcium: 6.7 mg/dL — ABNORMAL LOW (ref 8.9–10.3)
Chloride: 100 mmol/L (ref 98–111)
Creatinine, Ser: 10.66 mg/dL — ABNORMAL HIGH (ref 0.44–1.00)
GFR, Estimated: 4 mL/min — ABNORMAL LOW (ref >60)
Glucose, Bld: 117 mg/dL — ABNORMAL HIGH (ref 70–99)
Phosphorus: 9.9 mg/dL — ABNORMAL HIGH (ref 2.5–4.6)
Potassium: 6.1 mmol/L — ABNORMAL HIGH (ref 3.5–5.1)
Sodium: 136 mmol/L (ref 135–145)

## 2021-11-08 LAB — LACTIC ACID, PLASMA
Lactic Acid, Venous: 1.7 mmol/L (ref 0.5–1.9)
Lactic Acid, Venous: 2 mmol/L (ref 0.5–1.9)
Lactic Acid, Venous: 1.5 mmol/L (ref 0.5–1.9)
Lactic Acid, Venous: 2.5 mmol/L (ref 0.5–1.9)

## 2021-11-08 LAB — PROTIME-INR
INR: 1.2 (ref 0.8–1.2)
Prothrombin Time: 15.3 seconds — ABNORMAL HIGH (ref 11.4–15.2)

## 2021-11-08 LAB — CBC
HCT: 29.9 % — ABNORMAL LOW (ref 36.0–46.0)
Hemoglobin: 8.8 g/dL — ABNORMAL LOW (ref 12.0–15.0)
MCH: 23.3 pg — ABNORMAL LOW (ref 26.0–34.0)
MCHC: 29.4 g/dL — ABNORMAL LOW (ref 30.0–36.0)
MCV: 79.3 fL — ABNORMAL LOW (ref 80.0–100.0)
Platelets: 375 10*3/uL (ref 150–400)
RBC: 3.77 MIL/uL — ABNORMAL LOW (ref 3.87–5.11)
RDW: 19 % — ABNORMAL HIGH (ref 11.5–15.5)
WBC: 12.8 10*3/uL — ABNORMAL HIGH (ref 4.0–10.5)
nRBC: 0 % (ref 0.0–0.2)

## 2021-11-08 LAB — MRSA NEXT GEN BY PCR, NASAL: MRSA by PCR Next Gen: NOT DETECTED

## 2021-11-08 LAB — GLUCOSE, CAPILLARY: Glucose-Capillary: 98 mg/dL (ref 70–99)

## 2021-11-08 LAB — SARS CORONAVIRUS 2 BY RT PCR: SARS Coronavirus 2 by RT PCR: NEGATIVE

## 2021-11-08 LAB — CORTISOL: Cortisol, Plasma: 16.9 ug/dL

## 2021-11-08 LAB — PROCALCITONIN: Procalcitonin: 9.57 ng/mL

## 2021-11-08 MED ORDER — PANTOPRAZOLE SODIUM 40 MG IV SOLR
40.0000 mg | Freq: Every day | INTRAVENOUS | Status: DC
  Administered 2021-11-08 – 2021-11-20 (×13): 40 mg via INTRAVENOUS
  Filled 2021-11-08 (×14): qty 10

## 2021-11-08 MED ORDER — INSULIN ASPART 100 UNIT/ML IV SOLN
5.0000 [IU] | Freq: Once | INTRAVENOUS | Status: AC
  Administered 2021-11-08: 5 [IU] via INTRAVENOUS

## 2021-11-08 MED ORDER — CHLORHEXIDINE GLUCONATE CLOTH 2 % EX PADS
6.0000 | MEDICATED_PAD | Freq: Every day | CUTANEOUS | Status: DC
  Administered 2021-11-08 – 2021-11-16 (×9): 6 via TOPICAL

## 2021-11-08 MED ORDER — ACETAMINOPHEN 325 MG PO TABS
650.0000 mg | ORAL_TABLET | Freq: Once | ORAL | Status: AC
  Administered 2021-11-08: 650 mg via ORAL
  Filled 2021-11-08: qty 2

## 2021-11-08 MED ORDER — SODIUM CHLORIDE 0.9 % FOR CRRT: INTRAVENOUS_CENTRAL | Status: DC | PRN

## 2021-11-08 MED ORDER — CLOPIDOGREL BISULFATE 75 MG PO TABS: 75.0000 mg | ORAL_TABLET | Freq: Every day | ORAL | Status: DC

## 2021-11-08 MED ORDER — METRONIDAZOLE 500 MG/100ML IV SOLN
500.0000 mg | Freq: Once | INTRAVENOUS | Status: AC
  Administered 2021-11-08: 500 mg via INTRAVENOUS
  Filled 2021-11-08: qty 100

## 2021-11-08 MED ORDER — VANCOMYCIN HCL 1250 MG/250ML IV SOLN
1250.0000 mg | Freq: Once | INTRAVENOUS | Status: AC
  Administered 2021-11-08: 1250 mg via INTRAVENOUS
  Filled 2021-11-08: qty 250

## 2021-11-08 MED ORDER — DEXTROSE 50 % IV SOLN
1.0000 | Freq: Once | INTRAVENOUS | Status: AC
  Administered 2021-11-08: 50 mL via INTRAVENOUS
  Filled 2021-11-08: qty 50

## 2021-11-08 MED ORDER — CALCITRIOL 0.5 MCG PO CAPS
1.5000 ug | ORAL_CAPSULE | Freq: Every day | ORAL | Status: DC
  Administered 2021-11-08 – 2021-11-13 (×5): 1.5 ug via ORAL
  Filled 2021-11-08 (×8): qty 3

## 2021-11-08 MED ORDER — VANCOMYCIN HCL 500 MG/100ML IV SOLN: 500.0000 mg | INTRAVENOUS | Status: DC

## 2021-11-08 MED ORDER — MONTELUKAST SODIUM 10 MG PO TABS
10.0000 mg | ORAL_TABLET | Freq: Every day | ORAL | Status: DC
  Administered 2021-11-08 – 2021-11-19 (×7): 10 mg via ORAL
  Filled 2021-11-08 (×12): qty 1

## 2021-11-08 MED ORDER — CALCIUM GLUCONATE 10 % IV SOLN
1.0000 g | Freq: Once | INTRAVENOUS | Status: AC
  Administered 2021-11-08: 1 g via INTRAVENOUS
  Filled 2021-11-08: qty 10

## 2021-11-08 MED ORDER — SODIUM CHLORIDE 0.9 % IV SOLN: 2.0000 g | Freq: Once | INTRAVENOUS | Status: DC

## 2021-11-08 MED ORDER — LACTATED RINGERS IV SOLN: INTRAVENOUS | Status: DC

## 2021-11-08 MED ORDER — NOREPINEPHRINE 4 MG/250ML-% IV SOLN
0.0000 ug/min | INTRAVENOUS | Status: DC
  Administered 2021-11-08: 2 ug/min via INTRAVENOUS
  Filled 2021-11-08: qty 250

## 2021-11-08 MED ORDER — ACETAMINOPHEN 325 MG PO TABS
650.0000 mg | ORAL_TABLET | Freq: Four times a day (QID) | ORAL | Status: AC | PRN
  Administered 2021-11-08 – 2021-11-09 (×2): 650 mg via ORAL
  Filled 2021-11-08 (×2): qty 2

## 2021-11-08 MED ORDER — PAROXETINE HCL 20 MG PO TABS: 20.0000 mg | ORAL_TABLET | Freq: Every day | ORAL | Status: DC

## 2021-11-08 MED ORDER — PRISMASOL BGK 0/2.5 32-2.5 MEQ/L EC SOLN
Status: DC
  Filled 2021-11-08 (×10): qty 5000

## 2021-11-08 MED ORDER — HEPARIN SODIUM (PORCINE) 5000 UNIT/ML IJ SOLN
5000.0000 [IU] | Freq: Three times a day (TID) | INTRAMUSCULAR | Status: DC
  Administered 2021-11-08 – 2021-11-09 (×3): 5000 [IU] via SUBCUTANEOUS
  Filled 2021-11-08 (×3): qty 1

## 2021-11-08 MED ORDER — SODIUM CHLORIDE 0.9 % IV SOLN
2.0000 g | Freq: Once | INTRAVENOUS | Status: AC
  Administered 2021-11-08: 2 g via INTRAVENOUS
  Filled 2021-11-08: qty 12.5

## 2021-11-08 MED ORDER — INSULIN ASPART 100 UNIT/ML IJ SOLN
0.0000 [IU] | INTRAMUSCULAR | Status: DC
  Administered 2021-11-10: 1 [IU] via SUBCUTANEOUS

## 2021-11-08 MED ORDER — IPRATROPIUM-ALBUTEROL 0.5-2.5 (3) MG/3ML IN SOLN: 3.0000 mL | Freq: Four times a day (QID) | RESPIRATORY_TRACT | Status: DC | PRN

## 2021-11-08 MED ORDER — SODIUM CHLORIDE 0.9 % IV SOLN
2.0000 g | Freq: Two times a day (BID) | INTRAVENOUS | Status: DC
  Administered 2021-11-08 – 2021-11-09 (×2): 2 g via INTRAVENOUS
  Filled 2021-11-08 (×2): qty 12.5

## 2021-11-08 MED ORDER — VANCOMYCIN HCL IN DEXTROSE 1-5 GM/200ML-% IV SOLN
1000.0000 mg | Freq: Once | INTRAVENOUS | Status: DC
  Filled 2021-11-08: qty 200

## 2021-11-08 MED ORDER — VANCOMYCIN HCL 750 MG/150ML IV SOLN
750.0000 mg | INTRAVENOUS | Status: DC
  Filled 2021-11-08: qty 150

## 2021-11-08 MED ORDER — SODIUM CHLORIDE 0.9 % IV SOLN: 1.0000 g | INTRAVENOUS | Status: DC

## 2021-11-08 MED ORDER — ORAL CARE MOUTH RINSE: 15.0000 mL | OROMUCOSAL | Status: DC | PRN

## 2021-11-08 MED ORDER — CALCIUM ACETATE (PHOS BINDER) 667 MG PO CAPS
667.0000 mg | ORAL_CAPSULE | Freq: Three times a day (TID) | ORAL | Status: DC
  Administered 2021-11-09 – 2021-11-13 (×9): 667 mg via ORAL
  Filled 2021-11-08 (×14): qty 1

## 2021-11-08 MED ORDER — LOPERAMIDE HCL 2 MG PO CAPS
2.0000 mg | ORAL_CAPSULE | ORAL | Status: DC | PRN
  Administered 2021-11-08 – 2021-11-23 (×11): 2 mg via ORAL
  Filled 2021-11-08 (×11): qty 1

## 2021-11-08 MED ORDER — HEPARIN SODIUM (PORCINE) 1000 UNIT/ML DIALYSIS
1000.0000 [IU] | INTRAMUSCULAR | Status: DC | PRN
  Administered 2021-11-09: 2000 [IU] via INTRAVENOUS_CENTRAL
  Filled 2021-11-08 (×2): qty 6

## 2021-11-08 MED ORDER — PRISMASOL BGK 0/2.5 32-2.5 MEQ/L REPLACEMENT SOLN: Status: DC

## 2021-11-08 MED ORDER — ASPIRIN 81 MG PO CHEW
81.0000 mg | CHEWABLE_TABLET | Freq: Every day | ORAL | Status: DC
  Administered 2021-11-08: 81 mg via ORAL
  Filled 2021-11-08: qty 1

## 2021-11-08 MED ORDER — PRISMASOL BGK 0/2.5 32-2.5 MEQ/L EC SOLN
Status: DC
  Filled 2021-11-08 (×2): qty 5000

## 2021-11-08 NOTE — H&P (Signed)
NAME:  Susan Horton, MRN:  [id number], DOB:  16-Aug-1970, LOS: 0 ADMISSION DATE:  11/08/2021,  CHIEF COMPLAINT:  weakness, fever/chills   History of Present Illness:  51 year old woman, former smoker, with end-stage renal disease on HD, CAD, hypertension with systolic and diastolic CHF, diabetes complicated by peripheral neuropathy and gastroparesis, lower extremity DVT, asthma.  She underwent recent excision of tumoral calcinosis left hip/thigh, drain and sutures removed on 10/20/2021 without any evidence of infection per office notes.   She was brought to the emergency department 10/29 with 3 days of progressive fatigue, weakness.  She apparently suffered a mechanical fall on 10/27 with resultant R hip pain.  In the ED noted to be febrile, lethargic/confused, tachycardic.  Last HD 10/27 was a partial run.  Evaluation consistent with sepsis, mild leukocytosis.  Acute on chronic renal failure with uremia (117) and hyperkalemia 6.9.  Lactic acid reassuring 1.7.  COVID-19 negative.  Blood cultures sent and broad-spectrum antibiotics initiated >> cefepime, Flagyl, vancomycin.   Pertinent  Medical History   Past Medical History:  Diagnosis Date   Abnormal stress test    Anemia    Angio-edema    Asthma    CAD (coronary artery disease)    DES to mid LAD July 2018, residual moderate RCA disease   Cataract    CKD (chronic kidney disease) stage 4, GFR 15-29 ml/min (HCC)    Dialysis T/Th/Sa   DVT (deep venous thrombosis) ([place])    1996 during pregnancy, 2015 left leg   Eczema    Essential hypertension 12/11/2015   Gastroparesis    GERD (gastroesophageal reflux disease)    Gluteal abscess 12/27/2018   Hidradenitis    Migraine    Neuropathy    Peripheral vascular disease (HCC)    blood clot in leg   Progressive angina ([place]) 06/05/2014   Chest pain   Type 2 diabetes mellitus ([place])    Type II diabetes mellitus ([place])    Urticaria     Significant Hospital Events: Including procedures,  antibiotic start and stop dates in addition to other pertinent events     Interim History / Subjective:  Lethargic, started on norepinephrine 2  Objective   Blood pressure (!) 95/51, pulse (!) 108, temperature 99.1 F (37.3 C), temperature source Oral, resp. rate 17, SpO2 97 %.       No intake or output data in the 24 hours ending 11/08/21 1244 There were no vitals filed for this visit.  Examination: General: Critically ill-appearing young woman, HENT: Oropharynx dry, strong voice, strong cough, no upper airway secretions Lungs: Clear bilaterally Cardiovascular: Tachycardic, regular without a murmur. Abdomen: Nondistended, positive bowel sounds Extremities: Generalized pain on the feet.  Dressing left hip clean and dry Neuro: She is lethargic, somnolent but will wake to voice, answer questions and follow commands then back to sleep.  She has some involuntary movements and fasciculations while sleeping, but intentional with good strength and symmetrical movement when she is awake  Resolved Hospital Problem list     Assessment & Plan:  Septic shock.  Source unclear but concerning for possible bacteremia in patient with vascular access, on HD.  Chest x-ray without discrete infiltrate or consolidation -Cultures obtained and sent -Volume resuscitation, careful given HD status -Follow lactic acid (initial was not elevated) -Broad-spectrum antibiotics, continue cefepime and vancomycin pending culture data to guide therapy -Norepinephrine started, consider addition vasopressin if dose escalates -Consider hydrocortisone, stress dose steroids if refractory shock.  Cortisol pending -Correct acidosis -Follow procalcitonin -  We will obtain TTE to evaluate for any evidence of endocarditis.  Acute on chronic renal failure, ESRD on HD.  Only had a partial HD run on 10/27.   Hyperkalemia Uremia -HD catheter placed and plan for CVVHD as soon as she is in the ICU -Appreciate nephrology  management -Calcitriol (history calcinosis)  Toxic metabolic encephalopathy due to the above -Head CT reassuring -Correct underlying metabolic disarray and treat sepsis, supportive care -Protecting airway at this time, at risk for decline, need for airway protection.  Hopefully this will not be necessary if we correct underlying contributors -Avoid any sedation  CAD, PVD -Continue aspirin and Plavix -ECG -Consider follow-up troponin  Asthma -DuoNeb if needed -Singulair  Diabetes mellitus -Sign scale insulin per protocol, CBG goal <180  Tumoral calcinosis, recent left thigh/hip surgery -We will assess wound, obtain WOC    Best Practice (right click and "Reselect all SmartList Selections" daily)   Diet/type: NPO DVT prophylaxis: prophylactic heparin  GI prophylaxis: PPI Lines: Dialysis Catheter Foley:  Yes, and it is still needed Code Status:  full code Last date of multidisciplinary goals of care discussion [discussed status and goals for care with the patient's mother at bedside 10/29.  Full scope of care desired.]  Labs   CBC: Recent Labs  Lab 11/08/21 0929  WBC 12.8*  HGB 8.8*  HCT 29.9*  MCV 79.3*  PLT 786    Basic Metabolic Panel: Recent Labs  Lab 11/08/21 0929  NA 138  K 6.9*  CL 97*  CO2 17*  GLUCOSE 92  BUN 117*  CREATININE 10.68*  CALCIUM 6.9*   GFR: Estimated Creatinine Clearance: 5.6 mL/min (A) (by C-G formula based on SCr of 10.68 mg/dL (H)). Recent Labs  Lab 11/08/21 0929 11/08/21 0930  WBC 12.8*  --   LATICACIDVEN  --  1.7    Liver Function Tests: Recent Labs  Lab 11/08/21 0929  AST 21  ALT 9  ALKPHOS 171*  BILITOT 0.6  PROT 7.2  ALBUMIN 2.4*   No results for input(s): "LIPASE", "AMYLASE" in the last 168 hours. No results for input(s): "AMMONIA" in the last 168 hours.  ABG    Component Value Date/Time   HCO3 25.2 (H) 03/21/2013 1906   TCO2 30 07/18/2019 1111   ACIDBASEDEF 1.0 03/21/2013 1906   O2SAT 36.0  03/21/2013 1906     Coagulation Profile: No results for input(s): "INR", "PROTIME" in the last 168 hours.  Cardiac Enzymes: No results for input(s): "CKTOTAL", "CKMB", "CKMBINDEX", "TROPONINI" in the last 168 hours.  HbA1C: HbA1c, POC (controlled diabetic range)  Date/Time Value Ref Range Status  07/30/2021 10:57 AM 5.3 0.0 - 7.0 % Final  03/22/2018 04:18 PM 9.8 (A) 0.0 - 7.0 % Final   Hgb A1c MFr Bld  Date/Time Value Ref Range Status  06/30/2020 02:46 AM 4.9 4.8 - 5.6 % Final    Comment:    (NOTE)         Prediabetes: 5.7 - 6.4         Diabetes: >6.4         Glycemic control for adults with diabetes: <7.0   12/26/2018 10:47 AM 7.5 (H) 4.8 - 5.6 % Final    Comment:    (NOTE) Pre diabetes:          5.7%-6.4% Diabetes:              >6.4% Glycemic control for   <7.0% adults with diabetes     CBG: No results for input(s): "GLUCAP"  in the last 168 hours.  Review of Systems:   Patient unable to give  Past Medical History:  She,  has a past medical history of Abnormal stress test, Anemia, Angio-edema, Asthma, CAD (coronary artery disease), Cataract, CKD (chronic kidney disease) stage 4, GFR 15-29 ml/min ([place]), DVT (deep venous thrombosis) ([place]), Eczema, Essential hypertension (12/11/2015), Gastroparesis, GERD (gastroesophageal reflux disease), Gluteal abscess (12/27/2018), Hidradenitis, Migraine, Neuropathy, Peripheral vascular disease ([place]), Progressive angina ([place]) (06/05/2014), Type 2 diabetes mellitus ([place]), Type II diabetes mellitus ([place]), and Urticaria.   Surgical History:   Past Surgical History:  Procedure Laterality Date   A/V FISTULAGRAM N/A 06/22/2017   Procedure: A/V FISTULAGRAM - left arm;  Surgeon: [name], MD;  Location: [facility];  Service: Cardiovascular;  Laterality: N/A;   ABDOMINAL AORTOGRAM W/LOWER EXTREMITY Bilateral 12/14/2018   Procedure: ABDOMINAL AORTOGRAM W/LOWER EXTREMITY;  Surgeon: [name], MD;  Location: [facility];  Service: Cardiovascular;  Laterality: Bilateral;   ABDOMINAL AORTOGRAM W/LOWER EXTREMITY N/A 04/25/2019   Procedure: ABDOMINAL AORTOGRAM W/LOWER EXTREMITY;  Surgeon: [name], MD;  Location: [facility];  Service: Cardiovascular;  Laterality: N/A;   ABDOMINAL AORTOGRAM W/LOWER EXTREMITY Left 07/18/2019   Procedure: ABDOMINAL AORTOGRAM W/LOWER EXTREMITY;  Surgeon: [name], MD;  Location: [facility];  Service: Cardiovascular;  Laterality: Left;   ABDOMINOPLASTY     ADENOIDECTOMY     APPENDECTOMY  1995   AV FISTULA PLACEMENT Left 02/01/2017   Procedure: ARTERIOVENOUS BRACHIOCEPHALIC (AV) FISTULA CREATION;  Surgeon: [name], MD;  Location: [facility];  Service: Vascular;  Laterality: Left;   [place] INTERVENTION N/A 08/05/2016   Procedure: Coronary Stent Intervention;  Surgeon: [name], MD;  Location: [facility];  Service: Cardiovascular;  Laterality: N/A;   ESOPHAGOGASTRODUODENOSCOPY (EGD) WITH PROPOFOL N/A 07/01/2020   Procedure: ESOPHAGOGASTRODUODENOSCOPY (EGD) WITH PROPOFOL;  Surgeon: Gatha Mayer, MD;  Location: [facility];  Service: Endoscopy;  Laterality: N/A;   EXPLORATORY LAPAROTOMY  08/14/2005   lysis of adhesions, drainage of tubo-ovarian abscess   FISTULA SUPERFICIALIZATION Left 09/14/2017   Procedure: FISTULA SUPERFICIALIZATION ARTERIOVENOUS FISTULA LEFT ARM;  Surgeon: [name], MD;  Location: [facility];  Service: Vascular;  Laterality: Left;   FLEXIBLE SIGMOIDOSCOPY N/A 01/04/2019   Procedure: FLEXIBLE SIGMOIDOSCOPY;  Surgeon: Clarene Essex, MD;  Location: [facility];  Service: Endoscopy;  Laterality: N/A;   HYDRADENITIS EXCISION  02/08/2011   Procedure: EXCISION HYDRADENITIS GROIN;  Surgeon: [name], MD;  Location: [facility];  Service: General;  Laterality: N/A;  Excisioin of Hidradenitis Left groin    INCISION AND DRAINAGE ABSCESS N/A 01/11/2019   Procedure: INCISION AND DRAINAGE SACRAL ABSCESS;  Surgeon: Georganna Skeans, MD;  Location: [facility];  Service: General;  Laterality: N/A;   INGUINAL HIDRADENITIS EXCISION  07/06/2010   bilateral   INSERTION OF DIALYSIS CATHETER N/A 09/14/2017   Procedure: INSERTION OF TUNNELED DIALYSIS CATHETER Right Internal Jugular;  Surgeon: [name], MD;  Location: [facility];  Service: Vascular;  Laterality: N/A;   IR FLUORO GUIDE CV LINE RIGHT  03/01/2019   IR US GUIDE BX ASP/DRAIN  01/01/2019   IR US GUIDE VASC ACCESS RIGHT  03/01/2019   LEFT HEART CATH AND CORONARY ANGIOGRAPHY N/A 08/05/2016   Procedure: Left Heart Cath and Coronary Angiography;  Surgeon: [name], MD;  Location: [facility];  Service: Cardiovascular;  Laterality: N/A;   PERIPHERAL VASCULAR INTERVENTION Left 12/14/2018   Procedure: PERIPHERAL VASCULAR INTERVENTION;  Surgeon: [name], MD;  Location: [facility];  Service: Cardiovascular;  Laterality: Left;  SFA   PERIPHERAL VASCULAR INTERVENTION Left 04/25/2019   Procedure: PERIPHERAL VASCULAR INTERVENTION;  Surgeon: [name], MD;  Location: [facility];  Service: Cardiovascular;  Laterality: Left;  right superficial femoral, and left iliac   PERIPHERAL VASCULAR INTERVENTION Left 07/18/2019   Procedure: PERIPHERAL VASCULAR INTERVENTION;  Surgeon: [name], MD;  Location: [facility];  Service: Cardiovascular;  Laterality: Left;  external iliac   REDUCTION MAMMAPLASTY  2002   TONSILLECTOMY     TUBAL LIGATION  1996   VAGINAL HYSTERECTOMY  08/04/2005   and cysto     Social History:   reports that she quit smoking about 4 years ago. Her smoking use included cigarettes. She has never used smokeless tobacco. She reports current drug use. Frequency: 2.00 times per week. Drug: Marijuana. She reports that she does not drink alcohol.   Family History:  Her family history  includes Allergic rhinitis in her brother, father, mother, and sister; Breast cancer in her cousin; Hypertension in her brother, father, and sister.   Allergies Allergies  Allergen Reactions   Penicillins Anaphylaxis, Itching, Swelling, Rash and Other (See Comments)    Swelling of throat & whole mouth  Has patient had a PCN reaction causing immediate rash, facial/tongue/throat swelling, SOB or lightheadedness with hypotension: Yes Has patient had a PCN reaction causing severe rash involving mucus membranes or skin necrosis: No Has patient had a PCN reaction that required hospitalization: No Has patient had a PCN reaction occurring within the last 10 years: No If all of the above answers are "NO", then may proceed with Cephalosporin use.   Repatha [Evolocumab] Itching   Lisinopril Cough     Home Medications  Prior to Admission medications   Medication Sig Start Date End Date Taking? Authorizing Provider  acetaminophen-codeine (TYLENOL #3) 300-30 MG tablet Take 1 tablet by mouth at bedtime as needed for moderate pain. 05/11/21   Newt Minion, MD  albuterol (VENTOLIN HFA) 108 (90 Base) MCG/ACT inhaler Inhale 1-2 puffs into the lungs every 6 (six) hours as needed for wheezing or shortness of breath. 07/02/20   Mariel Aloe, MD  amLODipine (NORVASC) 10 MG tablet TAKE 1 TABLET BY MOUTH EVERY DAY 09/10/21   [name], MD  aspirin 81 MG chewable tablet Chew 1 tablet (81 mg total) by mouth daily. 08/07/16   Cheryln Manly, NP  atorvastatin (LIPITOR) 40 MG tablet TAKE 1 TABLET BY MOUTH EVERY DAY 12/22/20   [name], MD  B Complex-C-Zn-Folic Acid (DIALYVITE/ZINC) TABS Take 1 tablet by mouth daily. 03/25/20   [provider]  calcitRIOL (ROCALTROL) 0.5 MCG capsule Take 1.5 mcg by mouth daily. 09/11/18   [provider]  calcium acetate (PHOSLO) 667 MG capsule Take 667 mg by mouth 3 (three) times daily with meals. 09/23/17   [provider]  clopidogrel  (PLAVIX) 75 MG tablet TAKE 1 TABLET (75 MG TOTAL) BY MOUTH DAILY WITH BREAKFAST. 07/28/20   [name], MD  cyclobenzaprine (FLEXERIL) 10 MG tablet Take 1 tablet (10 mg total) by mouth 2 (two) times daily as needed. 08/06/20   Charlott Rakes, MD  fluticasone furoate-vilanterol (BREO ELLIPTA) 200-25 MCG/ACT AEPB Inhale 1 puff into the lungs daily. 11/05/21   Freeman Caldron  M, PA-C  gabapentin (NEURONTIN) 300 MG capsule Take 300 mg by mouth 3 (three) times daily. 10/27/21   [provider]  hydrALAZINE (APRESOLINE) 50 MG tablet Take 50 mg by mouth every 8 (eight) hours. 06/18/20   [provider]  HYDROcodone-acetaminophen (NORCO) 10-325 MG tablet Take 0.5 tablets by mouth every 8 (eight) hours as needed for severe pain. 05/10/21   Harris, Abigail, PA-C  ipratropium-albuterol (DUONEB) 0.5-2.5 (3) MG/3ML SOLN Take 3 mLs by nebulization every 6 (six) hours as needed. 07/02/20   Mariel Aloe, MD  iron sucrose (VENOFER) 20 MG/ML injection Iron Sucrose (Venofer) 07/11/19   [provider]  isosorbide mononitrate (IMDUR) 30 MG 24 hr tablet TAKE 0.5 TABLETS (15 MG TOTAL) BY MOUTH DAILY. 07/28/20 10/26/20  [name], MD  lanthanum (FOSRENOL) 1000 MG chewable tablet Chew 1,000 mg by mouth 5 (five) times daily as needed. 10/11/21   [provider]  Methoxy PEG-Epoetin Beta (MIRCERA IJ) Inject into the skin. 07/09/19   [provider]  metoprolol succinate (TOPROL-XL) 50 MG 24 hr tablet TAKE 1.5 TABLETS BY MOUTH DAILY 07/28/20   [name], MD  Misc. Devices MISC Electric scooter for mobility 06/24/21   Charlott Rakes, MD  montelukast (SINGULAIR) 10 MG tablet Take 1 tablet (10 mg total) by mouth at bedtime. 11/05/21   Argentina Donovan, PA-C  nitroGLYCERIN (NITROSTAT) 0.4 MG SL tablet Place 1 tablet (0.4 mg total) under the tongue every 5 (five) minutes as needed for chest pain. 08/06/19   [name], MD  ondansetron (ZOFRAN ODT) 4 MG  disintegrating tablet Take 1 tablet (4 mg total) by mouth every 8 (eight) hours as needed for nausea or vomiting. 07/02/20   Mariel Aloe, MD  ondansetron [facility]) 4 MG tablet Take by mouth. 10/09/21   [provider]  oxyCODONE-acetaminophen (PERCOCET/ROXICET) 5-325 MG tablet Take 1 tablet by mouth every 8 (eight) hours as needed for severe pain. 08/05/21   Suzan Slick, NP  pantoprazole (PROTONIX) 40 MG tablet Take 1 tablet (40 mg total) by mouth daily. 07/02/20   Mariel Aloe, MD  PARoxetine (PAXIL) 20 MG tablet Take 1 tablet (20 mg total) by mouth daily. For hot flashes 07/30/21   Charlott Rakes, MD  PRALUENT 75 MG/ML SOAJ INJECT 75 MG INTO THE SKIN EVERY 14 (FOURTEEN) DAYS. 02/07/20   [name], MD  promethazine (PHENERGAN) 25 MG tablet Take 1 tablet (25 mg total) by mouth every 8 (eight) hours as needed for nausea or vomiting. 07/30/21   Charlott Rakes, MD  sucralfate (CARAFATE) 1 GM/10ML suspension Take 10 mLs (1 g total) by mouth 4 (four) times daily -  with meals and at bedtime. 03/22/20   Jacqlyn Larsen, PA-C     Critical care time: 60 minutes     Baltazar Apo, MD, PhD 11/08/2021, 1:57 PM [facility] [phone] or if no answer before 7:00PM call [phone] For any issues after 7:00PM please call eLink [phone]

## 2021-11-08 NOTE — Consult Note (Signed)
[facility] Nurse Consult Note: Reason for Consult:left hip surgical wound (excision of tumoral calcinosis at [facility] one month ago on 10/09/21), Dr. Dois Davenport. Wound type:Surgical, neoplasm Pressure Injury POA: NA Measurement:To be obtained by bedside RN with next dressing application today Wound bed:N/A Drainage (amount, consistency, odor) None Periwound:Intact Dressing procedure/placement/frequency:I have provided Nursing with guidance for care of this post op wound using a NS cleanse, pat dry and covering with a folded layer of xeroform gauze topped with silicone foam. Pressure injury prevention interventions are provided: sacral prophylactic foam, heel flotation, turning and repositioning.   [place] nursing team will not follow, but will remain available to this patient, the nursing and medical teams.  Please re-consult if needed.  Thank you for inviting [name] to participate in this patient's Plan of Care.  Maudie Flakes, MSN, RN, CNS, [place], Serita Grammes, [employer], [employer] phone:  [phone]

## 2021-11-08 NOTE — Progress Notes (Signed)
Pharmacy Antibiotic Note  Susan Horton is a 51 y.o. female admitted on 11/08/2021 presenting with fever and chills, concern for sepsis.  Pharmacy has been consulted for vancomycin and cefepime dosing.  ESRD-HD usually MWF  Plan: Vancomycin 1250 mg IV x 1, then 500 mg IV q HD Cefepime 2g IV x 1, then 1g IV q 24h Monitor HD schedule, Cx and clinical progression to narrow Vancomycin random level as needed with HD     Temp (24hrs), Avg:102.6 F (39.2 C), Min:102.6 F (39.2 C), Max:102.6 F (39.2 C)  Recent Labs  Lab 11/08/21 0929 11/08/21 0930  WBC 12.8*  --   CREATININE 10.68*  --   LATICACIDVEN  --  1.7    Estimated Creatinine Clearance: 5.6 mL/min (A) (by C-G formula based on SCr of 10.68 mg/dL (H)).    Allergies  Allergen Reactions   Penicillins Anaphylaxis, Itching, Swelling, Rash and Other (See Comments)    Swelling of throat & whole mouth  Has patient had a PCN reaction causing immediate rash, facial/tongue/throat swelling, SOB or lightheadedness with hypotension: Yes Has patient had a PCN reaction causing severe rash involving mucus membranes or skin necrosis: No Has patient had a PCN reaction that required hospitalization: No Has patient had a PCN reaction occurring within the last 10 years: No If all of the above answers are "NO", then may proceed with Cephalosporin use.   Repatha [Evolocumab] Itching   Lisinopril Cough    Bertis Ruddy, PharmD Clinical Pharmacist ED Pharmacist Phone # [phone] 11/08/2021 11:34 AM

## 2021-11-08 NOTE — ED Provider Notes (Addendum)
[facility] EMERGENCY DEPARTMENT Provider Note   CSN: [id number] Arrival date & time: 11/08/21  3614     History  Chief Complaint  Patient presents with   Weakness    Susan Horton is a 51 y.o. female.  51 year old female brought in by mom for fever and chills.  Patient lives with her sister who assist with history by telephone in a limited capacity.  Sister is able to state that patient attends dialysis Monday, Wednesday, Friday, last had dialysis on Wednesday although did not receive full treatment that she was feeling unwell.  Also reports history of hip surgery 3 weeks ago to remove a large calcification from her left femur.  Patient had a fall on Friday however family does not know many details of this fall as a state patient is a very private person.  Unknown if LOC, did report pain in her right hip after the fall.  She has been ambulatory without difficulty using a cane until the past few days.  Family thought patient was having difficulty walking and was altered yesterday due to starting gabapentin recently.  Patient is alert to name, otherwise unable to really contribute anything to her reasons for being in the emergency room today.  She is ill-appearing. Complicated history is by sister patient lives with is at church today and availability is limited by phone, she is here with her mom who states she does not know many of the details as the patient is a very private person.       Home Medications Prior to Admission medications   Medication Sig Start Date End Date Taking? Authorizing Provider  acetaminophen-codeine (TYLENOL #3) 300-30 MG tablet Take 1 tablet by mouth at bedtime as needed for moderate pain. 05/11/21   Newt Minion, MD  albuterol (VENTOLIN HFA) 108 (90 Base) MCG/ACT inhaler Inhale 1-2 puffs into the lungs every 6 (six) hours as needed for wheezing or shortness of breath. 07/02/20   Mariel Aloe, MD  amLODipine (NORVASC) 10 MG tablet TAKE 1  TABLET BY MOUTH EVERY DAY 09/10/21   [name], MD  aspirin 81 MG chewable tablet Chew 1 tablet (81 mg total) by mouth daily. 08/07/16   Cheryln Manly, NP  atorvastatin (LIPITOR) 40 MG tablet TAKE 1 TABLET BY MOUTH EVERY DAY 12/22/20   [name], MD  B Complex-C-Zn-Folic Acid (DIALYVITE/ZINC) TABS Take 1 tablet by mouth daily. 03/25/20   [provider]  calcitRIOL (ROCALTROL) 0.5 MCG capsule Take 1.5 mcg by mouth daily. 09/11/18   [provider]  calcium acetate (PHOSLO) 667 MG capsule Take 667 mg by mouth 3 (three) times daily with meals. 09/23/17   [provider]  clopidogrel (PLAVIX) 75 MG tablet TAKE 1 TABLET (75 MG TOTAL) BY MOUTH DAILY WITH BREAKFAST. 07/28/20   [name], MD  cyclobenzaprine (FLEXERIL) 10 MG tablet Take 1 tablet (10 mg total) by mouth 2 (two) times daily as needed. 08/06/20   Charlott Rakes, MD  fluticasone furoate-vilanterol (BREO ELLIPTA) 200-25 MCG/ACT AEPB Inhale 1 puff into the lungs daily. 11/05/21   Argentina Donovan, PA-C  gabapentin (NEURONTIN) 300 MG capsule Take 300 mg by mouth 3 (three) times daily. 10/27/21   [provider]  hydrALAZINE (APRESOLINE) 50 MG tablet Take 50 mg by mouth every 8 (eight) hours. 06/18/20   [provider]  HYDROcodone-acetaminophen (NORCO) 10-325 MG tablet Take 0.5 tablets by mouth every 8 (eight) hours as needed for severe pain.  05/10/21   Harris, Abigail, PA-C  ipratropium-albuterol (DUONEB) 0.5-2.5 (3) MG/3ML SOLN Take 3 mLs by nebulization every 6 (six) hours as needed. 07/02/20   Mariel Aloe, MD  iron sucrose (VENOFER) 20 MG/ML injection Iron Sucrose (Venofer) 07/11/19   [provider]  isosorbide mononitrate (IMDUR) 30 MG 24 hr tablet TAKE 0.5 TABLETS (15 MG TOTAL) BY MOUTH DAILY. 07/28/20 10/26/20  [name], MD  lanthanum (FOSRENOL) 1000 MG chewable tablet Chew 1,000 mg by mouth 5 (five) times daily as needed. 10/11/21   [provider]   Methoxy PEG-Epoetin Beta (MIRCERA IJ) Inject into the skin. 07/09/19   [provider]  metoprolol succinate (TOPROL-XL) 50 MG 24 hr tablet TAKE 1.5 TABLETS BY MOUTH DAILY 07/28/20   [name], MD  Misc. Devices MISC Electric scooter for mobility 06/24/21   Charlott Rakes, MD  montelukast (SINGULAIR) 10 MG tablet Take 1 tablet (10 mg total) by mouth at bedtime. 11/05/21   Argentina Donovan, PA-C  nitroGLYCERIN (NITROSTAT) 0.4 MG SL tablet Place 1 tablet (0.4 mg total) under the tongue every 5 (five) minutes as needed for chest pain. 08/06/19   [name], MD  ondansetron (ZOFRAN ODT) 4 MG disintegrating tablet Take 1 tablet (4 mg total) by mouth every 8 (eight) hours as needed for nausea or vomiting. 07/02/20   Mariel Aloe, MD  ondansetron [facility]) 4 MG tablet Take by mouth. 10/09/21   [provider]  oxyCODONE-acetaminophen (PERCOCET/ROXICET) 5-325 MG tablet Take 1 tablet by mouth every 8 (eight) hours as needed for severe pain. 08/05/21   Suzan Slick, NP  pantoprazole (PROTONIX) 40 MG tablet Take 1 tablet (40 mg total) by mouth daily. 07/02/20   Mariel Aloe, MD  PARoxetine (PAXIL) 20 MG tablet Take 1 tablet (20 mg total) by mouth daily. For hot flashes 07/30/21   Charlott Rakes, MD  PRALUENT 75 MG/ML SOAJ INJECT 75 MG INTO THE SKIN EVERY 14 (FOURTEEN) DAYS. 02/07/20   [name], MD  promethazine (PHENERGAN) 25 MG tablet Take 1 tablet (25 mg total) by mouth every 8 (eight) hours as needed for nausea or vomiting. 07/30/21   Charlott Rakes, MD  sucralfate (CARAFATE) 1 GM/10ML suspension Take 10 mLs (1 g total) by mouth 4 (four) times daily -  with meals and at bedtime. 03/22/20   Jacqlyn Larsen, PA-C      Allergies    Penicillins, Repatha [evolocumab], and Lisinopril    Review of Systems   Review of Systems Level 5 caveat for acuity and change in mental status Physical Exam Updated Vital Signs BP (!) 95/45   Pulse (!) 108   Temp 99.1 F (37.3 C)  (Oral)   Resp 18   SpO2 97%  Physical Exam Vitals and nursing note reviewed.  Constitutional:      Appearance: She is well-developed. She is ill-appearing. She is not diaphoretic.  HENT:     Head: Normocephalic and atraumatic.     Mouth/Throat:     Mouth: Mucous membranes are dry.  Eyes:     Pupils: Pupils are equal, round, and reactive to light.  Cardiovascular:     Rate and Rhythm: Regular rhythm. Tachycardia present.     Pulses: Normal pulses.     Heart sounds: Normal heart sounds.  Pulmonary:     Effort: Pulmonary effort is normal.     Breath sounds: Normal breath sounds.  Abdominal:     Palpations: Abdomen is soft.  Tenderness: There is no abdominal tenderness.  Musculoskeletal:        General: Tenderness present.     Cervical back: Neck supple.     Right lower leg: No edema.     Left lower leg: No edema.     Comments: Reports chronic tenderness in her feet  Skin:    General: Skin is warm and dry.  Neurological:     GCS: GCS eye subscore is 4. GCS verbal subscore is 3. GCS motor subscore is 6.     Cranial Nerves: No facial asymmetry.     Motor: Weakness present.     ED Results / Procedures / Treatments   Labs (all labs ordered are listed, but only abnormal results are displayed) Labs Reviewed  CBC - Abnormal; Notable for the following components:      Result Value   WBC 12.8 (*)    RBC 3.77 (*)    Hemoglobin 8.8 (*)    HCT 29.9 (*)    MCV 79.3 (*)    MCH 23.3 (*)    MCHC 29.4 (*)    RDW 19.0 (*)    All other components within normal limits  COMPREHENSIVE METABOLIC PANEL - Abnormal; Notable for the following components:   Potassium 6.9 (*)    Chloride 97 (*)    CO2 17 (*)    BUN 117 (*)    Creatinine, Ser 10.68 (*)    Calcium 6.9 (*)    Albumin 2.4 (*)    Alkaline Phosphatase 171 (*)    GFR, Estimated 4 (*)    Anion gap 24 (*)    All other components within normal limits  SARS CORONAVIRUS 2 BY RT PCR  CULTURE, BLOOD (ROUTINE X 2)  CULTURE,  BLOOD (ROUTINE X 2)  URINE CULTURE  LACTIC ACID, PLASMA  LACTIC ACID, PLASMA  PROTIME-INR  APTT  URINALYSIS, ROUTINE W REFLEX MICROSCOPIC    EKG EKG Interpretation  Date/Time:  Sunday November 08 2021 08:54:26 EDT Ventricular Rate:  120 PR Interval:    QRS Duration: 92 QT Interval:  330 QTC Calculation: 466 R Axis:   92 Text Interpretation: Accelerated Junctional rhythm Rightward axis Nonspecific ST abnormality Abnormal ECG When compared with ECG of 02-Jul-2020 10:04, PREVIOUS ECG IS PRESENT Peaked T waves Limited by motion artifact Confirmed by Georgina Snell [phone]) on 11/08/2021 12:06:20 PM  Radiology CT Head Wo Contrast  Result Date: 11/08/2021 CLINICAL DATA:  Head trauma.  Abnormal mental status. EXAM: CT HEAD WITHOUT CONTRAST TECHNIQUE: Contiguous axial images were obtained from the base of the skull through the vertex without intravenous contrast. RADIATION DOSE REDUCTION: This exam was performed according to the departmental dose-optimization program which includes automated exposure control, adjustment of the mA and/or kV according to patient size and/or use of iterative reconstruction technique. COMPARISON:  None Available. FINDINGS: Brain: No evidence of acute infarction, hemorrhage, hydrocephalus, extra-axial collection or mass lesion/mass effect. Vascular: No hyperdense vessel or unexpected calcification. Skull: Normal. Negative for fracture or focal lesion. Sinuses/Orbits: No acute finding. Other: None. IMPRESSION: No acute intracranial abnormalities. Electronically Signed   By: Dorise Bullion III M.D.   On: 11/08/2021 11:54   DG Hip Unilat W or Wo Pelvis 2-3 Views Left  Result Date: 11/08/2021 CLINICAL DATA:  Fall/recent surgery. EXAM: DG HIP (WITH OR WITHOUT PELVIS) 2-3V LEFT COMPARISON:  July 20, 2021 FINDINGS: A vascular stent is identified in the proximal femoral artery. A vascular stent is identified in the left iliac artery. Calcified atherosclerotic changes are  identified in the pelvic and femoral vessels. The lower lumbar spine, sacrum, and pelvic bones are intact. The right proximal femur is grossly intact. No left hip fracture is noted. No dislocation. The large area of soft tissue calcification previously described as tumoral calcinosis measures 21 by 15 cm today versus 21 x 14 cm on the comparison study. The small difference in measurement could be technical. IMPRESSION: 1. No fracture or dislocation identified. 2. The large area of soft tissue calcification previously described as tumoral calcinosis measures 21 x 15 cm today versus 21 x 14 cm on the comparison study. The small difference in measurement could be technical. 3. Vascular stents as above. 4. Calcified atherosclerotic changes. Electronically Signed   By: Dorise Bullion III M.D.   On: 11/08/2021 11:28   DG Chest 2 View  Result Date: 11/08/2021 CLINICAL DATA:  Fever EXAM: CHEST - 2 VIEW COMPARISON:  None Available. FINDINGS: Persistent cardiomegaly, mildly more prominent compared to the June 29, 2020. The hila and mediastinum are normal. Vague hazy opacity in the right mid and lower lung. No pneumothorax. No other acute abnormalities or changes. IMPRESSION: 1. Cardiomegaly. 2. Vague hazy opacity in the right mid and lower lung is worrisome for early developing infiltrate/pneumonia given history of fever. Recommend short-term follow-up imaging to ensure resolution. 3. No other acute abnormalities. Electronically Signed   By: Dorise Bullion III M.D.   On: 11/08/2021 11:24    Procedures .Critical Care  Performed by: Tacy Learn, PA-C Authorized by: Tacy Learn, PA-C   Critical care provider statement:    Critical care time (minutes):  30   Critical care was time spent personally by me on the following activities:  Development of treatment plan with patient or surrogate, discussions with consultants, evaluation of patient's response to treatment, examination of patient, ordering and  review of laboratory studies, ordering and review of radiographic studies, ordering and performing treatments and interventions, pulse oximetry, re-evaluation of patient's condition and review of old charts     Medications Ordered in ED Medications  lactated ringers infusion ( Intravenous New Bag/Given 11/08/21 1159)  metroNIDAZOLE (FLAGYL) IVPB 500 mg (500 mg Intravenous New Bag/Given 11/08/21 1207)  vancomycin (VANCOREADY) IVPB 1250 mg/250 mL (1,250 mg Intravenous New Bag/Given 11/08/21 1208)  vancomycin (VANCOREADY) IVPB 500 mg/100 mL (has no administration in time range)  ceFEPIme (MAXIPIME) 1 g in sodium chloride 0.9 % 100 mL IVPB (has no administration in time range)  norepinephrine (LEVOPHED) '4mg'$  in 257m (0.016 mg/mL) premix infusion (2 mcg/min Intravenous New Bag/Given 11/08/21 1300)  acetaminophen (TYLENOL) tablet 650 mg (650 mg Oral Given 11/08/21 0938)  calcium gluconate inj 10% (1 g) URGENT USE ONLY! (1 g Intravenous Given 11/08/21 1159)  insulin aspart (novoLOG) injection 5 Units (5 Units Intravenous Given 11/08/21 1200)    And  dextrose 50 % solution 50 mL (50 mLs Intravenous Given 11/08/21 1201)  ceFEPIme (MAXIPIME) 2 g in sodium chloride 0.9 % 100 mL IVPB (0 g Intravenous Stopped 11/08/21 1237)    ED Course/ Medical Decision Making/ A&P                           Medical Decision Making Amount and/or Complexity of Data Reviewed Labs: ordered. Radiology: ordered.  Risk OTC drugs. Prescription drug management. Decision regarding hospitalization.   This patient presents to the ED for concern of altered mental status, fever, this involves an extensive number of treatment options, and is a complaint  that carries with it a high risk of complications and morbidity.  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  The pertinent results include: CBC with leukocytosis white count of 12.8, hemoglobin baseline at 8.8.  CMP with hyperkalemia with potassium of 6.9.  BUN elevated at 117, creatinine 10.68.  Anion gap of 24 with bicarb of 17, glucose normal at 92.  Lactic acid 1.7.  COVID-negative.   Imaging Studies ordered:  I ordered imaging studies including chest x-ray, CT head, x-ray of left hip I independently visualized and interpreted imaging which showed concern for right-sided pneumonia.  CT head without acute findings, x-ray of left hip without acute findings I agree with the radiologist interpretation   Cardiac Monitoring: / EKG:  The patient was maintained on a cardiac monitor.  I personally viewed and interpreted the cardiac monitored which showed an underlying rhythm of: Accelerated junctional rhythm, rate 120   Consultations Obtained:  I requested consultation with the nephrology, Dr. Johnney Ou,  and discussed lab and imaging findings as well as pertinent plan - they recommend: consult CC for admission, may need chest catheter access Discussed with critical care team who will see the patient.    Problem List / ED Course / Critical interventions / Medication management  51 year old female brought in by EMS from home with concern for altered mental status.  History is obtained from mom at bedside as well as by sister over telephone.  Patient lives with her sister, concerned for change in mental  status onset yesterday, worse today.  Family also reports a fall on Friday, unknown details.  Patient arrives in triage, altered with temperature of 102.6.  She is tachycardic.  Her EKG is concerning for peaked T waves, ESRD patient with dialysis Monday/Wednesday/Friday, with last partial session on Wednesday.  Patient is alert to person, otherwise unable to give any meaningful history, she is shivering in bed even after having received Tylenol.  Her potassium is elevated at 6.9.  Call to nephrology with concern for dialysis patient who has missed dialysis, here with fever, hyperkalemia with EKG changes.  Nephrology requests admission for critical care.  Hyperkalemia managed with dextrose, insulin, calcium gluconate.  In regards to her fever, soft blood pressure and tachycardia, sepsis orders initiated and the biotics were ordered including cefepime, vancomycin, Flagyl.  Case discussed with critical care who will see the patient. Chest x-ray concerning for possible pneumonia.  Hip x-ray without acute bony abnormality, known chronic calcified tumor.  Head CT negative for acute intracranial abnormality. Reevaluation of the patient after these medicines showed that the patient stayed the same I have reviewed the patients home medicines and have made adjustments as needed   Social Determinants of Health:  Lives with sister   Test / Admission - Considered:  Admit for further management/treatment          Final Clinical Impression(s) / ED Diagnoses Final diagnoses:  Sepsis, due to unspecified organism,  unspecified whether acute organ dysfunction present (HCC)  Hyperkalemia  Pneumonia of right lung due to infectious organism, unspecified part of lung  Altered mental status, unspecified altered mental status type    Rx / DC Orders ED Discharge Orders     None         Tacy Learn, PA-C 11/08/21 1302    Tacy Learn, PA-C 11/08/21 1303    Elgie Congo, MD 11/08/21  [place], [place], MD 11/08/21 1326

## 2021-11-08 NOTE — Procedures (Signed)
Central Venous Catheter Insertion Procedure Note  Susan Horton  [id number]  [birth year]  Date:11/08/21  Time:12:46 PM   Provider Performing:Lori Popowski Cipriano Mile   Procedure: Insertion of Non-tunneled Central Venous [phone]) with US guidance ([id number])   Indication(s) Hemodialysis  Consent Verbal  Anesthesia Topical only with 1% lidocaine   Timeout Verified patient identification, verified procedure, site/side was marked, verified correct patient position, special equipment/implants available, medications/allergies/relevant history reviewed, required imaging and test results available.  Sterile Technique Maximal sterile technique including full sterile barrier drape, hand hygiene, sterile gown, sterile gloves, mask, hair covering, sterile ultrasound probe cover (if used).  Procedure Description Area of catheter insertion was cleaned with chlorhexidine and draped in sterile fashion.  With real-time ultrasound guidance a HD catheter was placed into the right internal jugular vein. Nonpulsatile blood flow and easy flushing noted in all ports.  The catheter was sutured in place and sterile dressing applied.  Complications/Tolerance None; patient tolerated the procedure well. Chest X-ray is ordered to verify placement for internal jugular or subclavian cannulation.   Chest x-ray is not ordered for femoral cannulation.  EBL Minimal  Specimen(s) None

## 2021-11-08 NOTE — Progress Notes (Signed)
[place] Progress Note Patient Name: Susan Horton DOB: 1970-12-18 MRN: [id number]   Date of Service  11/08/2021  HPI/Events of Note  Pain in hip 5/10  eICU Interventions  Ordered tylenol prn, if not better to call back.      Intervention Category Intermediate Interventions: Pain - evaluation and management  Elmer Sow 11/08/2021, 9:11 PM

## 2021-11-08 NOTE — Progress Notes (Signed)
[place] Progress Note Patient Name: Susan Horton DOB: 01-May-1970 MRN: [id number]   Date of Service  11/08/2021  HPI/Events of Note  LA up to 2.5 from 1.5 On CRRT.  Septic shock. Possible bacteremia.   eICU Interventions  Continue current care, trend LA at mid  Keep MAP > 65, on pressor  Discussed with RN.     Intervention Category Intermediate Interventions: Other: (LA up)  Elmer Sow 11/08/2021, 7:58 PM

## 2021-11-08 NOTE — Progress Notes (Signed)
Pharmacy Antibiotic Note  Susan Horton is a 51 y.o. female admitted on 11/08/2021 presenting with fever and chills, concern for sepsis.  Pharmacy has been consulted for vancomycin and cefepime dosing.  ESRD-HD usually MWF 11/08/2021 To start CRRT today- will adjust abx  Tm 102.6, WBC 12.8  Plan: Vancomycin 1250 mg IV x 1 given @ 1208, then 750 mg IV q24 while on CRRT Cefepime 2g IV x 1 given at 1208, then 2g IV q 12h while on CRRT F/u rate & tolerance of CRRT F/u MRSA PCR F/u WBC, temp, culture data   Temp (24hrs), Avg:100.9 F (38.3 C), Min:99.1 F (37.3 C), Max:102.6 F (39.2 C)  Recent Labs  Lab 11/08/21 0929 11/08/21 0930 11/08/21 1247  WBC 12.8*  --   --   CREATININE 10.68*  --   --   LATICACIDVEN  --  1.7 2.0*     Estimated Creatinine Clearance: 5.6 mL/min (A) (by C-G formula based on SCr of 10.68 mg/dL (H)).    Allergies  Allergen Reactions   Penicillins Anaphylaxis, Itching, Swelling, Rash and Other (See Comments)    Swelling of throat & whole mouth  Has patient had a PCN reaction causing immediate rash, facial/tongue/throat swelling, SOB or lightheadedness with hypotension: Yes Has patient had a PCN reaction causing severe rash involving mucus membranes or skin necrosis: No Has patient had a PCN reaction that required hospitalization: No Has patient had a PCN reaction occurring within the last 10 years: No If all of the above answers are "NO", then may proceed with Cephalosporin use.   Repatha [Evolocumab] Itching   Lisinopril Cough  Antimicrobials this admission:  10/29 Cefepime>> 10/29 Vancomycin >> 10/29 Flagyl  Dose adjustments this admission:   Microbiology results:  10/29 MRSA:  10/29 BCx2:   Eudelia Bunch, Pharm.D 11/08/2021 3:05 PM

## 2021-11-08 NOTE — Consult Note (Signed)
[facility]  INPATIENT CONSULTATION  Reason for Consultation: hyperkalemia, ESRD Requesting Provider: Dr. Lamonte Sakai  HPI: Susan Horton is an 51 y.o. female with ESRD on HD [facility] MWF, HTN, HL, DM, CAD, gastroparesis, PAD, h/o DVT, L tumoral calcinosis excision 10/10/26 who presented with fever and AMS and nephrology is consulted for RRT in light of ESRD and K 6.9.  Pt has been feeling poorly for a few days - rec'd only part of HD Friday and signed off due to feeling bad.  Came into the ED today with fever 102.9, tachycardia, confused.  Labs with K 6.9, BUN 117, Ca 6.9, Alb 2.4, WBC 12.8, Hb 8.8, Plt 275, Lactate 2. CXR poss early R PNA, CT head neg, R hip X ray calcification ~same size (reportedly fell on this side and had pain). Given broad spectrum abx, HD line placed.  Sent from [facility] ER to [facility] ICU due to bed availability.  Plan for CRRT this afternoon.   History obtained from mom who is bedside and chart - pt unable to provide history.    PMH: Past Medical History:  Diagnosis Date   Abnormal stress test    Anemia    Angio-edema    Asthma    CAD (coronary artery disease)    DES to mid LAD July 2018, residual moderate RCA disease   Cataract    CKD (chronic kidney disease) stage 4, GFR 15-29 ml/min (HCC)    Dialysis T/Th/Sa   DVT (deep venous thrombosis) ([place])    1996 during pregnancy, 2015 left leg   Eczema    Essential hypertension 12/11/2015   Gastroparesis    GERD (gastroesophageal reflux disease)    Gluteal abscess 12/27/2018   Hidradenitis    Migraine    Neuropathy    Peripheral vascular disease (HCC)    blood clot in leg   Progressive angina ([place]) 06/05/2014   Chest pain   Type 2 diabetes mellitus ([place])    Type II diabetes mellitus ([place])    Urticaria    PSH: Past Surgical History:  Procedure Laterality Date   A/V FISTULAGRAM N/A 06/22/2017   Procedure: A/V FISTULAGRAM - left arm;  Surgeon: [name], MD;  Location: [place] [facility];   Service: Cardiovascular;  Laterality: N/A;   ABDOMINAL AORTOGRAM W/LOWER EXTREMITY Bilateral 12/14/2018   Procedure: ABDOMINAL AORTOGRAM W/LOWER EXTREMITY;  Surgeon: [name], MD;  Location: [place] [facility];  Service: Cardiovascular;  Laterality: Bilateral;   ABDOMINAL AORTOGRAM W/LOWER EXTREMITY N/A 04/25/2019   Procedure: ABDOMINAL AORTOGRAM W/LOWER EXTREMITY;  Surgeon: [name], MD;  Location: [place] CV LAB;  Service: Cardiovascular;  Laterality: N/A;   ABDOMINAL AORTOGRAM W/LOWER EXTREMITY Left 07/18/2019   Procedure: ABDOMINAL AORTOGRAM W/LOWER EXTREMITY;  Surgeon: [name], MD;  Location: [place] CV LAB;  Service: Cardiovascular;  Laterality: Left;   ABDOMINOPLASTY     ADENOIDECTOMY     APPENDECTOMY  1995   AV FISTULA PLACEMENT Left 02/01/2017   Procedure: ARTERIOVENOUS BRACHIOCEPHALIC (AV) FISTULA CREATION;  Surgeon: [name], MD;  Location: [place] OR;  Service: Vascular;  Laterality: Left;   [place] INTERVENTION N/A 08/05/2016   Procedure: Coronary Stent Intervention;  Surgeon: [name], MD;  Location: [place] [facility];  Service: Cardiovascular;  Laterality: N/A;   ESOPHAGOGASTRODUODENOSCOPY (EGD) WITH PROPOFOL N/A 07/01/2020   Procedure: ESOPHAGOGASTRODUODENOSCOPY (EGD) WITH PROPOFOL;  Surgeon: Gatha Mayer,  MD;  Location: [place] [facility];  Service: Endoscopy;  Laterality: N/A;   EXPLORATORY LAPAROTOMY  08/14/2005   lysis of adhesions, drainage of tubo-ovarian abscess   FISTULA SUPERFICIALIZATION Left 09/14/2017   Procedure: FISTULA SUPERFICIALIZATION ARTERIOVENOUS FISTULA LEFT ARM;  Surgeon: [name], MD;  Location: [place];  Service: Vascular;  Laterality: Left;   FLEXIBLE SIGMOIDOSCOPY N/A 01/04/2019   Procedure: FLEXIBLE SIGMOIDOSCOPY;  Surgeon: Clarene Essex, MD;  Location: [place];  Service: Endoscopy;  Laterality: N/A;   HYDRADENITIS  EXCISION  02/08/2011   Procedure: EXCISION HYDRADENITIS GROIN;  Surgeon: [name], MD;  Location: [place];  Service: General;  Laterality: N/A;  Excisioin of Hidradenitis Left groin   INCISION AND DRAINAGE ABSCESS N/A 01/11/2019   Procedure: INCISION AND DRAINAGE SACRAL ABSCESS;  Surgeon: Georganna Skeans, MD;  Location: [place];  Service: General;  Laterality: N/A;   INGUINAL HIDRADENITIS EXCISION  07/06/2010   bilateral   INSERTION OF DIALYSIS CATHETER N/A 09/14/2017   Procedure: INSERTION OF TUNNELED DIALYSIS CATHETER Right Internal Jugular;  Surgeon: [name], MD;  Location: [place];  Service: Vascular;  Laterality: N/A;   IR FLUORO GUIDE CV LINE RIGHT  03/01/2019   IR US GUIDE BX ASP/DRAIN  01/01/2019   IR US GUIDE VASC ACCESS RIGHT  03/01/2019   LEFT HEART CATH AND CORONARY ANGIOGRAPHY N/A 08/05/2016   Procedure: Left Heart Cath and Coronary Angiography;  Surgeon: [name], MD;  Location: [place] CV LAB;  Service: Cardiovascular;  Laterality: N/A;   PERIPHERAL VASCULAR INTERVENTION Left 12/14/2018   Procedure: PERIPHERAL VASCULAR INTERVENTION;  Surgeon: [name], MD;  Location: [place] [facility] LAB;  Service: Cardiovascular;  Laterality: Left;  SFA   PERIPHERAL VASCULAR INTERVENTION Left 04/25/2019   Procedure: PERIPHERAL VASCULAR INTERVENTION;  Surgeon: [name], MD;  Location: [place] [facility];  Service: Cardiovascular;  Laterality: Left;  right superficial femoral, and left iliac   PERIPHERAL VASCULAR INTERVENTION Left 07/18/2019   Procedure: PERIPHERAL VASCULAR INTERVENTION;  Surgeon: [name], MD;  Location: [place] [facility];  Service: Cardiovascular;  Laterality: Left;  external iliac   REDUCTION MAMMAPLASTY  2002   TONSILLECTOMY     TUBAL LIGATION  1996   VAGINAL HYSTERECTOMY  08/04/2005   and cysto     Past Medical History:  Diagnosis Date   Abnormal stress test    Anemia    Angio-edema    Asthma     CAD (coronary artery disease)    DES to mid LAD July 2018, residual moderate RCA disease   Cataract    CKD (chronic kidney disease) stage 4, GFR 15-29 ml/min (HCC)    Dialysis T/Th/Sa   DVT (deep venous thrombosis) ([place])    1996 during pregnancy, 2015 left leg   Eczema    Essential hypertension 12/11/2015   Gastroparesis    GERD (gastroesophageal reflux disease)    Gluteal abscess 12/27/2018   Hidradenitis    Migraine    Neuropathy    Peripheral vascular disease (HCC)    blood clot in leg   Progressive angina ([place]) 06/05/2014   Chest pain   Type 2 diabetes mellitus (HCC)    Type II diabetes mellitus (HCC)    Urticaria     Medications:  I have reviewed the patient's current medications.  Medications Prior to Admission  Medication Sig Dispense Refill   acetaminophen-codeine (TYLENOL #3) 300-30 MG tablet Take 1 tablet by mouth at bedtime as needed for moderate  pain. 30 tablet 0   albuterol (VENTOLIN HFA) 108 (90 Base) MCG/ACT inhaler Inhale 1-2 puffs into the lungs every 6 (six) hours as needed for wheezing or shortness of breath. 18 g 2   amLODipine (NORVASC) 10 MG tablet TAKE 1 TABLET BY MOUTH EVERY DAY 90 tablet 3   aspirin 81 MG chewable tablet Chew 1 tablet (81 mg total) by mouth daily. 30 tablet 0   atorvastatin (LIPITOR) 40 MG tablet TAKE 1 TABLET BY MOUTH EVERY DAY 90 tablet 1   B Complex-C-Zn-Folic Acid (DIALYVITE/ZINC) TABS Take 1 tablet by mouth daily.     calcitRIOL (ROCALTROL) 0.5 MCG capsule Take 1.5 mcg by mouth daily.     calcium acetate (PHOSLO) 667 MG capsule Take 667 mg by mouth 3 (three) times daily with meals.  10   clopidogrel (PLAVIX) 75 MG tablet TAKE 1 TABLET (75 MG TOTAL) BY MOUTH DAILY WITH BREAKFAST. 90 tablet 3   cyclobenzaprine (FLEXERIL) 10 MG tablet Take 1 tablet (10 mg total) by mouth 2 (two) times daily as needed. 60 tablet 1   fluticasone furoate-vilanterol (BREO ELLIPTA) 200-25 MCG/ACT AEPB Inhale 1 puff into the lungs daily. 60 each 3    gabapentin (NEURONTIN) 300 MG capsule Take 300 mg by mouth 3 (three) times daily.     hydrALAZINE (APRESOLINE) 50 MG tablet Take 50 mg by mouth every 8 (eight) hours.     HYDROcodone-acetaminophen (NORCO) 10-325 MG tablet Take 0.5 tablets by mouth every 8 (eight) hours as needed for severe pain. 8 tablet 0   ipratropium-albuterol (DUONEB) 0.5-2.5 (3) MG/3ML SOLN Take 3 mLs by nebulization every 6 (six) hours as needed. 360 mL 2   iron sucrose (VENOFER) 20 MG/ML injection Iron Sucrose (Venofer)     isosorbide mononitrate (IMDUR) 30 MG 24 hr tablet TAKE 0.5 TABLETS (15 MG TOTAL) BY MOUTH DAILY. 45 tablet 3   lanthanum (FOSRENOL) 1000 MG chewable tablet Chew 1,000 mg by mouth 5 (five) times daily as needed.     Methoxy PEG-Epoetin Beta (MIRCERA IJ) Inject into the skin.     metoprolol succinate (TOPROL-XL) 50 MG 24 hr tablet TAKE 1.5 TABLETS BY MOUTH DAILY 135 tablet 3   Misc. Devices MISC Electric scooter for mobility 1 each 0   montelukast (SINGULAIR) 10 MG tablet Take 1 tablet (10 mg total) by mouth at bedtime. 90 tablet 1   nitroGLYCERIN (NITROSTAT) 0.4 MG SL tablet Place 1 tablet (0.4 mg total) under the tongue every 5 (five) minutes as needed for chest pain. 25 tablet 2   ondansetron (ZOFRAN ODT) 4 MG disintegrating tablet Take 1 tablet (4 mg total) by mouth every 8 (eight) hours as needed for nausea or vomiting. 20 tablet 0   ondansetron (ZOFRAN) 4 MG tablet Take by mouth.     oxyCODONE-acetaminophen (PERCOCET/ROXICET) 5-325 MG tablet Take 1 tablet by mouth every 8 (eight) hours as needed for severe pain. 21 tablet 0   pantoprazole (PROTONIX) 40 MG tablet Take 1 tablet (40 mg total) by mouth daily. 30 tablet 0   PARoxetine (PAXIL) 20 MG tablet Take 1 tablet (20 mg total) by mouth daily. For hot flashes 30 tablet 3   PRALUENT 75 MG/ML SOAJ INJECT 75 MG INTO THE SKIN EVERY 14 (FOURTEEN) DAYS. 2 mL 11   promethazine (PHENERGAN) 25 MG tablet Take 1 tablet (25 mg total) by mouth every 8 (eight)  hours as needed for nausea or vomiting. 30 tablet 1   sucralfate (CARAFATE) 1 GM/10ML suspension Take 10 mLs (  1 g total) by mouth 4 (four) times daily -  with meals and at bedtime. 420 mL 0    ALLERGIES:   Allergies  Allergen Reactions   Penicillins Anaphylaxis, Itching, Swelling, Rash and Other (See Comments)    Swelling of throat & whole mouth  Has patient had a PCN reaction causing immediate rash, facial/tongue/throat swelling, SOB or lightheadedness with hypotension: Yes Has patient had a PCN reaction causing severe rash involving mucus membranes or skin necrosis: No Has patient had a PCN reaction that required hospitalization: No Has patient had a PCN reaction occurring within the last 10 years: No If all of the above answers are "NO", then may proceed with Cephalosporin use.   Repatha [Evolocumab] Itching   Lisinopril Cough    FAM HX: Family History  Problem Relation Age of Onset   Allergic rhinitis Mother    Hypertension Father    Allergic rhinitis Father    Hypertension Sister    Allergic rhinitis Sister    Hypertension Brother    Allergic rhinitis Brother    Breast cancer Cousin        x2    Social History:   reports that she quit smoking about 4 years ago. Her smoking use included cigarettes. She has never used smokeless tobacco. She reports current drug use. Frequency: 2.00 times per week. Drug: Marijuana. She reports that she does not drink alcohol.  ROS: unable to obtain from pt given AMS  Blood pressure 119/61, pulse (!) 108, temperature 99.1 F (37.3 C), temperature source Oral, resp. rate 19, SpO2 97 %. PHYSICAL EXAM: Gen: ill appearing, rigoring, moaning  Eyes: anicteric ENT:MMM Neck: moving freely CV:  tachycardic, regular Abd:  soft Lungs: clear ant, no coughing Extr:  trace edema noted, LUE AVF +t/b - no erythema or warmth Neuro: lethargic Skin: did not eval L hip - wound care note rev'd - no erythema or drainage   Results for orders placed or  performed during the hospital encounter of 11/08/21 (from the past 48 hour(s))  SARS Coronavirus 2 by RT PCR (hospital order, performed in [place] hospital lab) *cepheid single result test* Anterior Nasal Swab     Status: None   Collection Time: 11/08/21  9:04 AM   Specimen: Anterior Nasal Swab  Result Value Ref Range   SARS Coronavirus 2 by RT PCR NEGATIVE NEGATIVE    Comment: (NOTE) SARS-CoV-2 target nucleic acids are NOT DETECTED.  The SARS-CoV-2 RNA is generally detectable in upper and lower respiratory specimens during the acute phase of infection. The lowest concentration of SARS-CoV-2 viral copies this assay can detect is 250 copies / mL. A negative result does not preclude SARS-CoV-2 infection and should not be used as the sole basis for treatment or other patient management decisions.  A negative result may occur with improper specimen collection / handling, submission of specimen other than nasopharyngeal swab, presence of viral mutation(s) within the areas targeted by this assay, and inadequate number of viral copies (<250 copies / mL). A negative result must be combined with clinical observations, patient history, and epidemiological information.  Fact Sheet for Patients:   https://www.patel.info/  Fact Sheet for Healthcare Providers: https://hall.com/  This test is not yet approved or  cleared by the [place] FDA and has been authorized for detection and/or diagnosis of SARS-CoV-2 by FDA under an Emergency Use Authorization (EUA).  This EUA will remain in effect (meaning this test can be used) for the duration of the COVID-19 declaration under Section  564(b)(1) of the Act, 21 U.S.C. section 360bbb-3(b)(1), unless the authorization is terminated or revoked sooner.  Performed at [facility], [place] [address], [place], [place] [address]   CBC     Status: Abnormal   Collection Time: 11/08/21  9:29 AM  Result  Value Ref Range   WBC 12.8 (H) 4.0 - 10.5 K/uL   RBC 3.77 (L) 3.87 - 5.11 MIL/uL   Hemoglobin 8.8 (L) 12.0 - 15.0 g/dL   HCT 29.9 (L) 36.0 - 46.0 %   MCV 79.3 (L) 80.0 - 100.0 fL   MCH 23.3 (L) 26.0 - 34.0 pg   MCHC 29.4 (L) 30.0 - 36.0 g/dL   RDW 19.0 (H) 11.5 - 15.5 %   Platelets 375 150 - 400 K/uL   nRBC 0.0 0.0 - 0.2 %    Comment: Performed at [facility], [place] [address], [place], [place] [address]  Comprehensive metabolic panel     Status: Abnormal   Collection Time: 11/08/21  9:29 AM  Result Value Ref Range   Sodium 138 135 - 145 mmol/L   Potassium 6.9 (HH) 3.5 - 5.1 mmol/L    Comment: CRITICAL RESULT CALLED TO, READ BACK BY AND VERIFIED WITH B. MONTEE RN 11/08/21 '@1029'$  BY J. WHITE   Chloride 97 (L) 98 - 111 mmol/L   CO2 17 (L) 22 - 32 mmol/L   Glucose, Bld 92 70 - 99 mg/dL    Comment: Glucose reference range applies only to samples taken after fasting for at least 8 hours.   BUN 117 (H) 6 - 20 mg/dL   Creatinine, Ser 10.68 (H) 0.44 - 1.00 mg/dL   Calcium 6.9 (L) 8.9 - 10.3 mg/dL   Total Protein 7.2 6.5 - 8.1 g/dL   Albumin 2.4 (L) 3.5 - 5.0 g/dL   AST 21 15 - 41 U/L   ALT 9 0 - 44 U/L   Alkaline Phosphatase 171 (H) 38 - 126 U/L   Total Bilirubin 0.6 0.3 - 1.2 mg/dL   GFR, Estimated 4 (L) >60 mL/min    Comment: (NOTE) Calculated using the CKD-EPI Creatinine Equation (2021)    Anion gap 24 (H) 5 - 15    Comment: Electrolytes repeated to confirm. Performed at [facility], [place] [address], [place], [place] [address]   Lactic acid, plasma     Status: None   Collection Time: 11/08/21  9:30 AM  Result Value Ref Range   Lactic Acid, Venous 1.7 0.5 - 1.9 mmol/L    Comment: Performed at [place] [address], [place], [place] [address]  Lactic acid, plasma     Status: Abnormal   Collection Time: 11/08/21 12:47 PM  Result Value Ref Range   Lactic Acid, Venous 2.0 (HH) 0.5 - 1.9 mmol/L    Comment: CRITICAL RESULT CALLED TO, READ BACK BY AND  VERIFIED WITH P. PULLIAM RN 11/07/21 '@1330'$  BY J. WHITE Performed at [place] [address] [place], [place] [address]   Protime-INR     Status: Abnormal   Collection Time: 11/08/21 12:50 PM  Result Value Ref Range   Prothrombin Time 15.3 (H) 11.4 - 15.2 seconds   INR 1.2 0.8 - 1.2    Comment: (NOTE) INR goal varies based on device and disease states. Performed at [facility], [place] [address] [place], [place] [address]   APTT     Status: Abnormal   Collection Time: 11/08/21 12:50 PM  Result Value Ref Range   aPTT 37 (H) 24 - 36 seconds    Comment:        IF BASELINE aPTT IS ELEVATED, SUGGEST PATIENT RISK ASSESSMENT BE USED TO DETERMINE APPROPRIATE ANTICOAGULANT THERAPY. Performed at [facility], [place] [address] [place], [place] [address]     DG Chest Portable 1 View  Result Date: 11/08/2021 CLINICAL DATA:  Central line placement. EXAM: PORTABLE CHEST 1 VIEW COMPARISON:  Earlier same day FINDINGS: 1251 hours. Low volume film. The cardio pericardial silhouette is enlarged. Diffuse bilateral hazy airspace opacity suggests edema. Right IJ central line tip overlies the upper right atrium. No evidence for pneumothorax. Telemetry leads overlie the chest. IMPRESSION: Right IJ central line tip overlies the upper right atrium. No evidence for pneumothorax. Cardiomegaly with pulmonary edema. Electronically Signed   By: Misty Stanley M.D.   On: 11/08/2021 13:13   CT Head Wo Contrast  Result Date: 11/08/2021 CLINICAL DATA:  Head trauma.  Abnormal mental status. EXAM: CT HEAD WITHOUT CONTRAST TECHNIQUE: Contiguous axial images were obtained from the base of the skull through the vertex without intravenous contrast. RADIATION DOSE REDUCTION: This exam was performed according to the departmental dose-optimization program which includes automated exposure control, adjustment of the mA and/or kV according to patient size and/or use of iterative reconstruction technique.  COMPARISON:  None Available. FINDINGS: Brain: No evidence of acute infarction, hemorrhage, hydrocephalus, extra-axial collection or mass lesion/mass effect. Vascular: No hyperdense vessel or unexpected calcification. Skull: Normal. Negative for fracture or focal lesion. Sinuses/Orbits: No acute finding. Other: None. IMPRESSION: No acute intracranial abnormalities. Electronically Signed   By: Dorise Bullion III M.D.   On: 11/08/2021 11:54   DG Hip Unilat W or Wo Pelvis 2-3 Views Left  Result Date: 11/08/2021 CLINICAL DATA:  Fall/recent surgery. EXAM: DG HIP (WITH OR WITHOUT PELVIS) 2-3V LEFT COMPARISON:  July 20, 2021 FINDINGS: A vascular stent is identified in the proximal femoral artery. A vascular stent is identified in the left iliac artery. Calcified atherosclerotic changes are identified in the pelvic and femoral vessels. The lower lumbar spine, sacrum, and pelvic bones are intact. The right proximal femur is grossly intact. No left hip fracture is noted. No dislocation. The large area of soft tissue calcification previously described as tumoral calcinosis measures 21 by 15 cm today versus 21 x 14 cm on the comparison study. The small difference in measurement could be technical. IMPRESSION: 1. No fracture or dislocation identified. 2. The large area of soft tissue calcification previously described as tumoral calcinosis measures 21 x 15 cm today versus 21 x 14 cm on the comparison study. The small difference in measurement could be technical. 3. Vascular stents as above. 4. Calcified atherosclerotic changes. Electronically Signed   By: Dorise Bullion III M.D.   On: 11/08/2021 11:28   DG Chest 2 View  Result Date: 11/08/2021 CLINICAL DATA:  Fever EXAM: CHEST - 2 VIEW COMPARISON:  None Available. FINDINGS: Persistent cardiomegaly, mildly more prominent compared to the June 29, 2020. The hila and mediastinum are normal. Vague hazy opacity in the right mid and lower lung. No pneumothorax. No other  acute abnormalities or changes. IMPRESSION: 1. Cardiomegaly. 2. Vague hazy opacity in the right mid and lower lung is worrisome for early developing infiltrate/pneumonia given history of fever. Recommend short-term follow-up imaging to ensure resolution. 3. No other acute abnormalities. Electronically Signed   By: Dorise Bullion III M.D.   On: 11/08/2021 11:24    Assessment/Plan **septic  shock: management/abx per PCCM.  Cultures pending.  LUE AVF doesn't look to be infected. Recent L hip surgery incision for tumoral calcinosis assessed by wound - looks ok too.   **Hyperkalemia:  CRRT 0K/2K/2K - f/u labs later - d/w RN via phone.    **ESRD on HD:  CRRT in light of instability - BUN 117, may help mental status some.  Not grossly overloaded, will do no UF for now but follow  **AGMA: secondary to ESRD and septic shock  **Encephalopathy: shock and uremia - care per above.   **BMM:   cont outpt fosrenol  when eating.   **Anemia: Hb 8.8 was 10 on 10/23, on mircera 200 q2 weeks, last dose 10/16 - dose aranesp while in. Holding IV iron with sepsis.  **CAD, PAD **DM  Will follow, contact with issues.  Justin Mend 11/08/2021, 2:32 PM

## 2021-11-08 NOTE — ED Notes (Signed)
Critical potassium of 6.9 reported to [facility] MD

## 2021-11-08 NOTE — Progress Notes (Signed)
[facility] following for sepsis protocol. 

## 2021-11-08 NOTE — ED Notes (Signed)
Pt transported to X-ray then CT to follow.

## 2021-11-08 NOTE — ED Triage Notes (Addendum)
Pt here from home with c/o tremors this morning along with a fall yesterday , pt is also a dialysis pt M/W/F, pt has a fever upon arrival and hip surgery 3 weeks ago

## 2021-11-08 NOTE — Progress Notes (Signed)
Seen briefly Looks septic K high Temp measures given. No beds at [facility] will place catheter and send to [facility] for CRRT/pressors. Patient/family updated  Erskine Emery MD PCCM

## 2021-11-09 ENCOUNTER — Inpatient Hospital Stay (HOSPITAL_COMMUNITY): Payer: Medicare Other

## 2021-11-09 DIAGNOSIS — A419 Sepsis, unspecified organism: Secondary | ICD-10-CM | POA: Diagnosis not present

## 2021-11-09 DIAGNOSIS — I38 Endocarditis, valve unspecified: Secondary | ICD-10-CM

## 2021-11-09 DIAGNOSIS — R6521 Severe sepsis with septic shock: Secondary | ICD-10-CM | POA: Diagnosis not present

## 2021-11-09 DIAGNOSIS — J189 Pneumonia, unspecified organism: Secondary | ICD-10-CM | POA: Diagnosis not present

## 2021-11-09 DIAGNOSIS — R4182 Altered mental status, unspecified: Secondary | ICD-10-CM

## 2021-11-09 DIAGNOSIS — E875 Hyperkalemia: Secondary | ICD-10-CM | POA: Diagnosis not present

## 2021-11-09 LAB — RETICULOCYTES
Immature Retic Fract: 13.3 % (ref 2.3–15.9)
RBC.: 3.47 MIL/uL — ABNORMAL LOW (ref 3.87–5.11)
Retic Count, Absolute: 37.1 10*3/uL (ref 19.0–186.0)
Retic Ct Pct: 1.1 % (ref 0.4–3.1)

## 2021-11-09 LAB — COMPREHENSIVE METABOLIC PANEL
ALT: 17 U/L (ref 0–44)
AST: 39 U/L (ref 15–41)
Albumin: 1.6 g/dL — ABNORMAL LOW (ref 3.5–5.0)
Alkaline Phosphatase: 160 U/L — ABNORMAL HIGH (ref 38–126)
Anion gap: 14 (ref 5–15)
BUN: 47 mg/dL — ABNORMAL HIGH (ref 6–20)
CO2: 17 mmol/L — ABNORMAL LOW (ref 22–32)
Calcium: 7.2 mg/dL — ABNORMAL LOW (ref 8.9–10.3)
Chloride: 106 mmol/L (ref 98–111)
Creatinine, Ser: 4.18 mg/dL — ABNORMAL HIGH (ref 0.44–1.00)
GFR, Estimated: 12 mL/min — ABNORMAL LOW (ref >60)
Glucose, Bld: 72 mg/dL (ref 70–99)
Potassium: 3.9 mmol/L (ref 3.5–5.1)
Sodium: 137 mmol/L (ref 135–145)
Total Bilirubin: 0.6 mg/dL (ref 0.3–1.2)
Total Protein: 4.6 g/dL — ABNORMAL LOW (ref 6.5–8.1)

## 2021-11-09 LAB — ECHOCARDIOGRAM COMPLETE
AR max vel: 1.58 cm2
AV Area VTI: 1.54 cm2
AV Area mean vel: 1.49 cm2
AV Mean grad: 7 mmHg
AV Peak grad: 13 mmHg
Ao pk vel: 1.8 m/s
Area-P 1/2: 4.96 cm2
Calc EF: 45.7 %
S' Lateral: 3.3 cm
Single Plane A2C EF: 48.6 %
Single Plane A4C EF: 45.8 %

## 2021-11-09 LAB — CBC
HCT: 18.3 % — ABNORMAL LOW (ref 36.0–46.0)
HCT: 23.1 % — ABNORMAL LOW (ref 36.0–46.0)
Hemoglobin: 5.3 g/dL — CL (ref 12.0–15.0)
Hemoglobin: 6.6 g/dL — CL (ref 12.0–15.0)
MCH: 22.9 pg — ABNORMAL LOW (ref 26.0–34.0)
MCH: 22.7 pg — ABNORMAL LOW (ref 26.0–34.0)
MCHC: 29 g/dL — ABNORMAL LOW (ref 30.0–36.0)
MCHC: 28.6 g/dL — ABNORMAL LOW (ref 30.0–36.0)
MCV: 79.2 fL — ABNORMAL LOW (ref 80.0–100.0)
MCV: 79.4 fL — ABNORMAL LOW (ref 80.0–100.0)
Platelets: 237 10*3/uL (ref 150–400)
Platelets: 278 10*3/uL (ref 150–400)
RBC: 2.31 MIL/uL — ABNORMAL LOW (ref 3.87–5.11)
RBC: 2.91 MIL/uL — ABNORMAL LOW (ref 3.87–5.11)
RDW: 18.8 % — ABNORMAL HIGH (ref 11.5–15.5)
RDW: 18.7 % — ABNORMAL HIGH (ref 11.5–15.5)
WBC: 9.6 10*3/uL (ref 4.0–10.5)
WBC: 11.4 10*3/uL — ABNORMAL HIGH (ref 4.0–10.5)
nRBC: 0 % (ref 0.0–0.2)
nRBC: 0 % (ref 0.0–0.2)

## 2021-11-09 LAB — GLUCOSE, CAPILLARY
Glucose-Capillary: 113 mg/dL — ABNORMAL HIGH (ref 70–99)
Glucose-Capillary: 89 mg/dL (ref 70–99)
Glucose-Capillary: 98 mg/dL (ref 70–99)
Glucose-Capillary: 71 mg/dL (ref 70–99)
Glucose-Capillary: 79 mg/dL (ref 70–99)
Glucose-Capillary: 132 mg/dL — ABNORMAL HIGH (ref 70–99)
Glucose-Capillary: 184 mg/dL — ABNORMAL HIGH (ref 70–99)

## 2021-11-09 LAB — IRON AND TIBC
Iron: 22 ug/dL — ABNORMAL LOW (ref 28–170)
Saturation Ratios: 17 % (ref 10.4–31.8)
TIBC: 127 ug/dL — ABNORMAL LOW (ref 250–450)
UIBC: 105 ug/dL

## 2021-11-09 LAB — RENAL FUNCTION PANEL
Albumin: 1.6 g/dL — ABNORMAL LOW (ref 3.5–5.0)
Anion gap: 13 (ref 5–15)
BUN: 49 mg/dL — ABNORMAL HIGH (ref 6–20)
CO2: 17 mmol/L — ABNORMAL LOW (ref 22–32)
Calcium: 7.2 mg/dL — ABNORMAL LOW (ref 8.9–10.3)
Chloride: 105 mmol/L (ref 98–111)
Creatinine, Ser: 4.26 mg/dL — ABNORMAL HIGH (ref 0.44–1.00)
GFR, Estimated: 12 mL/min — ABNORMAL LOW (ref >60)
Glucose, Bld: 71 mg/dL (ref 70–99)
Phosphorus: 4.7 mg/dL — ABNORMAL HIGH (ref 2.5–4.6)
Potassium: 3.8 mmol/L (ref 3.5–5.1)
Sodium: 135 mmol/L (ref 135–145)

## 2021-11-09 LAB — PHOSPHORUS: Phosphorus: 4.5 mg/dL (ref 2.5–4.6)

## 2021-11-09 LAB — MAGNESIUM: Magnesium: 1.7 mg/dL (ref 1.7–2.4)

## 2021-11-09 LAB — PROTIME-INR
INR: 1.3 — ABNORMAL HIGH (ref 0.8–1.2)
Prothrombin Time: 15.9 seconds — ABNORMAL HIGH (ref 11.4–15.2)

## 2021-11-09 LAB — FERRITIN: Ferritin: 5455 ng/mL — ABNORMAL HIGH (ref 11–307)

## 2021-11-09 LAB — VITAMIN B12: Vitamin B-12: 498 pg/mL (ref 180–914)

## 2021-11-09 LAB — LACTIC ACID, PLASMA: Lactic Acid, Venous: 1.3 mmol/L (ref 0.5–1.9)

## 2021-11-09 LAB — FOLATE: Folate: 8.9 ng/mL (ref >5.9)

## 2021-11-09 LAB — PREPARE RBC (CROSSMATCH)

## 2021-11-09 LAB — HEMOGLOBIN AND HEMATOCRIT, BLOOD
HCT: 25.9 % — ABNORMAL LOW (ref 36.0–46.0)
Hemoglobin: 7.7 g/dL — ABNORMAL LOW (ref 12.0–15.0)

## 2021-11-09 LAB — PROCALCITONIN: Procalcitonin: 11.26 ng/mL

## 2021-11-09 MED ORDER — HYDROCODONE-ACETAMINOPHEN 5-325 MG PO TABS
1.0000 | ORAL_TABLET | Freq: Once | ORAL | Status: AC
  Administered 2021-11-09: 1 via ORAL
  Filled 2021-11-09: qty 1

## 2021-11-09 MED ORDER — SODIUM CHLORIDE 0.9 % IV SOLN
1.0000 g | INTRAVENOUS | Status: DC
  Administered 2021-11-10: 1 g via INTRAVENOUS
  Filled 2021-11-09 (×2): qty 10

## 2021-11-09 MED ORDER — DARBEPOETIN ALFA 200 MCG/0.4ML IJ SOSY
200.0000 ug | PREFILLED_SYRINGE | INTRAMUSCULAR | Status: DC
  Administered 2021-11-09: 200 ug via SUBCUTANEOUS
  Filled 2021-11-09 (×3): qty 0.4

## 2021-11-09 MED ORDER — IOHEXOL 350 MG/ML SOLN
100.0000 mL | Freq: Once | INTRAVENOUS | Status: AC | PRN
  Administered 2021-11-09: 100 mL via INTRAVENOUS

## 2021-11-09 MED ORDER — ACETAMINOPHEN 325 MG PO TABS
ORAL_TABLET | ORAL | Status: AC
  Filled 2021-11-09: qty 1

## 2021-11-09 MED ORDER — PRISMASOL BGK 4/2.5 32-4-2.5 MEQ/L REPLACEMENT SOLN: Status: DC

## 2021-11-09 MED ORDER — SODIUM CHLORIDE (PF) 0.9 % IJ SOLN
INTRAMUSCULAR | Status: AC
  Filled 2021-11-09: qty 50

## 2021-11-09 MED ORDER — ACETAMINOPHEN 500 MG PO TABS
ORAL_TABLET | ORAL | Status: AC
  Filled 2021-11-09: qty 1

## 2021-11-09 MED ORDER — PRISMASOL BGK 4/2.5 32-4-2.5 MEQ/L EC SOLN: Status: DC

## 2021-11-09 MED ORDER — GABAPENTIN 300 MG PO CAPS
300.0000 mg | ORAL_CAPSULE | Freq: Three times a day (TID) | ORAL | Status: DC
  Administered 2021-11-09 – 2021-11-10 (×4): 300 mg via ORAL
  Filled 2021-11-09 (×4): qty 1

## 2021-11-09 MED ORDER — SODIUM CHLORIDE 0.9% IV SOLUTION: Freq: Once | INTRAVENOUS | Status: AC

## 2021-11-09 MED ORDER — GABAPENTIN 300 MG PO CAPS
300.0000 mg | ORAL_CAPSULE | Freq: Once | ORAL | Status: AC
  Administered 2021-11-09: 300 mg via ORAL
  Filled 2021-11-09: qty 1

## 2021-11-09 NOTE — Progress Notes (Signed)
[facility] Progress Note  Subjective: pt feeling much better  Summary: ESRD on HD [facility] MWF, HTN, HL, DM, CAD, gastroparesis, PAD, h/o DVT, L tumoral calcinosis excision 10/10/26 who presented with fever and AMS and nephrology is consulted for RRT in light of ESRD and K 6.9.   Vitals:   11/09/21 0700 11/09/21 0800 11/09/21 0802 11/09/21 1224  BP: (!) 141/46 (!) 143/53    Pulse: 97 93 92   Resp: '15 14 19   '$ Temp:   99 F (37.2 C) 98.5 F (36.9 C)  TempSrc:   Oral Oral  SpO2: 99% 98% 98%     Exam:  alert, nad   no jvd  Chest cta bilat  Cor reg no RG  Abd soft ntnd no ascites   Ext no LE edema   Alert, NF, ox3   LUE AVF+bruit, no erythema   OP HD: [facility] 3h 24mn 400/600  61kg  2/2 bath P2  Hep 4000  LUE AVF - stays on the machine, missed 1 HD in past 3 wks - mircera 200 q2, last 10/16, due 10/30 - venofer '100mg'$  tiw thru 11/06 - doxercalciferol 7 ug tiw - sod thiosulfate 25 gm iv tiw  Assessment/ Plan: Septic shock: high fevers, cx's pending. Looks much better. Getting IV abx w/ cefepime/ flagyl/ vanc IV.  Hyperkalemia: resolved w/ CRRT ESRD on HD: CRRT started on 10/29 due to AMS, high BUN, etc. Doing much better today. Will dc CRRT. Pt will be tx'd to [facility] for iHD on Wed AGMA: secondary to ESRD and septic shock. Better.  Encephalopathy: shock and uremia, care per above.   BMM: cont outpt fosrenol  when eating Anemia of esrd: Hb 8.8 was 10 on 10/23, on mircera 200 q2 weeks, last dose 10/16, due today, will order 200 ug sq weekly. Holding IV iron with sepsis.  CAD/ PAD DM2   Rob Gilverto Dileonardo 11/09/2021, 2:48 PM   Recent Labs  Lab 11/08/21 1635 11/09/21 0310 11/09/21 0403 11/09/21 1030  HGB  --  5.3* 6.6* 7.7*  ALBUMIN 2.5* 1.6*  1.6*  --   --   CALCIUM 6.7* 7.2*  7.2*  --   --   PHOS 9.9* 4.5  4.7*  --   --   CREATININE 10.66* 4.18*  4.26*  --   --   K 6.1* 3.9  3.8  --   --    No results for input(s): "IRON", "TIBC", "FERRITIN" in the  last 168 hours. Inpatient medications:  calcitRIOL  1.5 mcg Oral Daily   calcium acetate  667 mg Oral TID WC   Chlorhexidine Gluconate Cloth  6 each Topical Daily   gabapentin  300 mg Oral TID   insulin aspart  0-6 Units Subcutaneous Q4H   montelukast  10 mg Oral QHS   pantoprazole (PROTONIX) IV  40 mg Intravenous QHS   sodium chloride (PF)         prismasol BGK 4/2.5 500 mL/hr at 11/09/21 0517    prismasol BGK 4/2.5 300 mL/hr at 11/09/21 0534   ceFEPime (MAXIPIME) IV Stopped (11/09/21 1051)   norepinephrine (LEVOPHED) Adult infusion Stopped (11/08/21 1927)   prismasol BGK 4/2.5 1,500 mL/hr at 11/09/21 0829   heparin, ipratropium-albuterol, loperamide, mouth rinse, sodium chloride (PF), sodium chloride

## 2021-11-09 NOTE — Progress Notes (Addendum)
NAME:  Susan Horton, MRN:  [id number], DOB:  06-23-1970, LOS: 1 ADMISSION DATE:  11/08/2021,  CHIEF COMPLAINT:  weakness, fever/chills   History of Present Illness:  51 year old woman, former smoker, with end-stage renal disease on HD, CAD, hypertension with systolic and diastolic CHF, diabetes complicated by peripheral neuropathy and gastroparesis, lower extremity DVT, asthma.  She underwent recent excision of tumoral calcinosis left hip/thigh, drain and sutures removed on 10/20/2021 without any evidence of infection per office notes.   She was brought to the emergency department 10/29 with 3 days of progressive fatigue, weakness.  She apparently suffered a mechanical fall on 10/27 with resultant R hip pain.  In the ED noted to be febrile, lethargic/confused, tachycardic.  Last HD 10/27 was a partial run.  Evaluation consistent with sepsis, mild leukocytosis.  Acute on chronic renal failure with uremia (117) and hyperkalemia 6.9.  Lactic acid reassuring 1.7.  COVID-19 negative.  Blood cultures sent and broad-spectrum antibiotics initiated >> cefepime, Flagyl, vancomycin.   Pertinent  Medical History   Past Medical History:  Diagnosis Date   Abnormal stress test    Anemia    Angio-edema    Asthma    CAD (coronary artery disease)    DES to mid LAD July 2018, residual moderate RCA disease   Cataract    CKD (chronic kidney disease) stage 4, GFR 15-29 ml/min (HCC)    Dialysis T/Th/Sa   DVT (deep venous thrombosis) ([place])    1996 during pregnancy, 2015 left leg   Eczema    Essential hypertension 12/11/2015   Gastroparesis    GERD (gastroesophageal reflux disease)    Gluteal abscess 12/27/2018   Hidradenitis    Migraine    Neuropathy    Peripheral vascular disease (HCC)    blood clot in leg   Progressive angina ([place]) 06/05/2014   Chest pain   Type 2 diabetes mellitus ([place])    Type II diabetes mellitus ([place])    Urticaria     Significant Hospital Events: Including procedures,  antibiotic start and stop dates in addition to other pertinent events     Interim History / Subjective:  Off pressors Transfused one unit PRBC for hemoglobin 6.6 CRRT running without issue.   Objective   Blood pressure (!) 143/53, pulse 92, temperature 99 F (37.2 C), temperature source Oral, resp. rate 19, SpO2 98 %.        Intake/Output Summary (Last 24 hours) at 11/09/2021 0909 Last data filed at 11/09/2021 0825 Gross per 24 hour  Intake 3450.84 ml  Output 2226 ml  Net 1224.84 ml   There were no vitals filed for this visit.  Examination: General: Middle aged female in NAD HENT: [place]/AT, PERRL, no JVD Lungs: Clear bilateral breath sounds Cardiovascular: RRR, no MRG Abdomen: Soft, non-tender, non-distended Extremities: Generalized pain on the feet.  Dressing left hip clean and dry Neuro: Spontaneously awake, alert, oriented, non-focal.   Resolved Hospital Problem list     Assessment & Plan:  Septic shock.  Source unclear but concerning for possible bacteremia in patient with vascular access, on HD.  Hip pain and "hematoma" on left hip worrisome for perhaps septic arthritis or abscess. Hemoglobin drop overnight raising concern for unknown site of hemorrhage ? RP bleed. Lactic mild rise overnight. Procalcitonin 11. -Pressors off -Cultures pending -Broad-spectrum antibiotics, continue cefepime and vancomycin pending culture data to guide therapy -Follow procalcitonin -TTE pending - CT left hip to rule out localized infection - CT abdomen and pelvis to assess for  retroperitoneal blood.  - Hold ASA, plavix, and VTE ppx  Acute on chronic renal failure, ESRD on HD.  Only had a partial HD run on 10/27.   Hyperkalemia Uremia -HD catheter placed 10/29 for emergent HD -Nephrology following.  -Calcitriol (history calcinosis) -Renal indices improved today. CRRT filter being changed. Will hold further RRT until after CT done.   Anemia: etiology unclear. Recent baseline  hemoglobin seems to be in the 9.5 range. 6.4 overnight. Transfused one unit PRBC.  - Trend hemoglobin - Transfuse as indicated - CT abdomen to rule out RP bleed.   Toxic metabolic encephalopathy due to the above. Improved - Limit sedating medications - Continue to monitor.   CAD, PVD - Hold aspirin and Plavix  Asthma -DuoNeb if needed -Singulair  Diabetes mellitus -Sign scale insulin per protocol, CBG goal <180 - Hypoglycemic this morning. Start renal diet.  - Restart home gabapentin.   Tumoral calcinosis, recent left thigh/hip surgery - Seen by wound care team with recommendations made.    Best Practice (right click and "Reselect all SmartList Selections" daily)   Diet/type: Regular consistency (see orders) DVT prophylaxis: other on hold pending CT and hemoglobin trend. No SCDs due to neuropathy.  GI prophylaxis: PPI Lines: Dialysis Catheter Foley:  Yes, and it is still needed Code Status:  full code Last date of multidisciplinary goals of care discussion [discussed status and goals for care with the patient's mother at bedside 10/29.  Full scope of care desired.]  Labs   CBC: Recent Labs  Lab 11/08/21 0929 11/09/21 0310 11/09/21 0403  WBC 12.8* 9.6 11.4*  HGB 8.8* 5.3* 6.6*  HCT 29.9* 18.3* 23.1*  MCV 79.3* 79.2* 79.4*  PLT 375 237 278     Basic Metabolic Panel: Recent Labs  Lab 11/08/21 0929 11/08/21 1635 11/09/21 0310  NA 138 136 137  135  K 6.9* 6.1* 3.9  3.8  CL 97* 100 106  105  CO2 17* 16* 17*  17*  GLUCOSE 92 117* 72  71  BUN 117* 113* 47*  49*  CREATININE 10.68* 10.66* 4.18*  4.26*  CALCIUM 6.9* 6.7* 7.2*  7.2*  MG  --   --  1.7  PHOS  --  9.9* 4.5  4.7*    GFR: Estimated Creatinine Clearance: 14.3 mL/min (A) (by C-G formula based on SCr of 4.18 mg/dL (H)). Recent Labs  Lab 11/08/21 0929 11/08/21 0930 11/08/21 1247 11/08/21 1633 11/08/21 1634 11/08/21 1911 11/08/21 2355 11/09/21 0310 11/09/21 0403  PROCALCITON  --    --   --  9.57  --   --   --  11.26  --   WBC 12.8*  --   --   --   --   --   --  9.6 11.4*  LATICACIDVEN  --    < > 2.0*  --  1.5 2.5* 1.3  --   --    < > = values in this interval not displayed.     Liver Function Tests: Recent Labs  Lab 11/08/21 0929 11/08/21 1635 11/09/21 0310  AST 21  --  39  ALT 9  --  17  ALKPHOS 171*  --  160*  BILITOT 0.6  --  0.6  PROT 7.2  --  4.6*  ALBUMIN 2.4* 2.5* 1.6*  1.6*    No results for input(s): "LIPASE", "AMYLASE" in the last 168 hours. No results for input(s): "AMMONIA" in the last 168 hours.  ABG    Component Value Date/Time  HCO3 25.2 (H) 03/21/2013 1906   TCO2 30 07/18/2019 1111   ACIDBASEDEF 1.0 03/21/2013 1906   O2SAT 36.0 03/21/2013 1906     Coagulation Profile: Recent Labs  Lab 11/08/21 1250  INR 1.2    Cardiac Enzymes: No results for input(s): "CKTOTAL", "CKMB", "CKMBINDEX", "TROPONINI" in the last 168 hours.  HbA1C: HbA1c, POC (controlled diabetic range)  Date/Time Value Ref Range Status  07/30/2021 10:57 AM 5.3 0.0 - 7.0 % Final  03/22/2018 04:18 PM 9.8 (A) 0.0 - 7.0 % Final   Hgb A1c MFr Bld  Date/Time Value Ref Range Status  06/30/2020 02:46 AM 4.9 4.8 - 5.6 % Final    Comment:    (NOTE)         Prediabetes: 5.7 - 6.4         Diabetes: >6.4         Glycemic control for adults with diabetes: <7.0   12/26/2018 10:47 AM 7.5 (H) 4.8 - 5.6 % Final    Comment:    (NOTE) Pre diabetes:          5.7%-6.4% Diabetes:              >6.4% Glycemic control for   <7.0% adults with diabetes     CBG: Recent Labs  Lab 11/08/21 1747 11/08/21 1926 11/08/21 2332 11/09/21 0317  GLUCAP 98 113* 98 89    Review of Systems:   Patient unable to give  Past Medical History:  She,  has a past medical history of Abnormal stress test, Anemia, Angio-edema, Asthma, CAD (coronary artery disease), Cataract, CKD (chronic kidney disease) stage 4, GFR 15-29 ml/min (HCC), DVT (deep venous thrombosis) ([place]), Eczema,  Essential hypertension (12/11/2015), Gastroparesis, GERD (gastroesophageal reflux disease), Gluteal abscess (12/27/2018), Hidradenitis, Migraine, Neuropathy, Peripheral vascular disease ([place]), Progressive angina ([place]) (06/05/2014), Type 2 diabetes mellitus ([place]), Type II diabetes mellitus ([place]), and Urticaria.   Surgical History:   Past Surgical History:  Procedure Laterality Date   A/V FISTULAGRAM N/A 06/22/2017   Procedure: A/V FISTULAGRAM - left arm;  Surgeon: [name], MD;  Location: [facility];  Service: Cardiovascular;  Laterality: N/A;   ABDOMINAL AORTOGRAM W/LOWER EXTREMITY Bilateral 12/14/2018   Procedure: ABDOMINAL AORTOGRAM W/LOWER EXTREMITY;  Surgeon: [name], MD;  Location: [facility];  Service: Cardiovascular;  Laterality: Bilateral;   ABDOMINAL AORTOGRAM W/LOWER EXTREMITY N/A 04/25/2019   Procedure: ABDOMINAL AORTOGRAM W/LOWER EXTREMITY;  Surgeon: [name], MD;  Location: [facility];  Service: Cardiovascular;  Laterality: N/A;   ABDOMINAL AORTOGRAM W/LOWER EXTREMITY Left 07/18/2019   Procedure: ABDOMINAL AORTOGRAM W/LOWER EXTREMITY;  Surgeon: [name], MD;  Location: [facility];  Service: Cardiovascular;  Laterality: Left;   ABDOMINOPLASTY     ADENOIDECTOMY     APPENDECTOMY  1995   AV FISTULA PLACEMENT Left 02/01/2017   Procedure: ARTERIOVENOUS BRACHIOCEPHALIC (AV) FISTULA CREATION;  Surgeon: [name], MD;  Location: [facility];  Service: Vascular;  Laterality: Left;   [place] INTERVENTION N/A 08/05/2016   Procedure: Coronary Stent Intervention;  Surgeon: [name], MD;  Location: [facility];  Service: Cardiovascular;  Laterality: N/A;   ESOPHAGOGASTRODUODENOSCOPY (EGD) WITH PROPOFOL N/A 07/01/2020   Procedure: ESOPHAGOGASTRODUODENOSCOPY (EGD) WITH PROPOFOL;  Surgeon: Gatha Mayer, MD;  Location: [facility];  Service: Endoscopy;  Laterality: N/A;   EXPLORATORY LAPAROTOMY  08/14/2005   lysis of  adhesions, drainage of tubo-ovarian abscess   FISTULA SUPERFICIALIZATION Left 09/14/2017   Procedure: FISTULA SUPERFICIALIZATION ARTERIOVENOUS FISTULA LEFT ARM;  Surgeon: [name], MD;  Location: [facility];  Service: Vascular;  Laterality: Left;   FLEXIBLE SIGMOIDOSCOPY N/A 01/04/2019   Procedure: FLEXIBLE SIGMOIDOSCOPY;  Surgeon: Clarene Essex, MD;  Location: [facility];  Service: Endoscopy;  Laterality: N/A;   HYDRADENITIS EXCISION  02/08/2011   Procedure: EXCISION HYDRADENITIS GROIN;  Surgeon: [name], MD;  Location: [facility];  Service: General;  Laterality: N/A;  Excisioin of Hidradenitis Left groin   INCISION AND DRAINAGE ABSCESS N/A 01/11/2019   Procedure: INCISION AND DRAINAGE SACRAL ABSCESS;  Surgeon: Georganna Skeans, MD;  Location: [facility];  Service: General;  Laterality: N/A;   INGUINAL HIDRADENITIS EXCISION  07/06/2010   bilateral   INSERTION OF DIALYSIS CATHETER N/A 09/14/2017   Procedure: INSERTION OF TUNNELED DIALYSIS CATHETER Right Internal Jugular;  Surgeon: [name], MD;  Location: [facility];  Service: Vascular;  Laterality: N/A;   IR FLUORO GUIDE CV LINE RIGHT  03/01/2019   IR US GUIDE BX ASP/DRAIN  01/01/2019   IR US GUIDE VASC ACCESS RIGHT  03/01/2019   LEFT HEART CATH AND CORONARY ANGIOGRAPHY N/A 08/05/2016   Procedure: Left Heart Cath and Coronary Angiography;  Surgeon: [name], MD;  Location: [facility];  Service: Cardiovascular;  Laterality: N/A;   PERIPHERAL VASCULAR INTERVENTION Left 12/14/2018   Procedure: PERIPHERAL VASCULAR INTERVENTION;  Surgeon: [name], MD;  Location: [facility];  Service: Cardiovascular;  Laterality: Left;  SFA   PERIPHERAL VASCULAR INTERVENTION Left 04/25/2019   Procedure: PERIPHERAL VASCULAR INTERVENTION;  Surgeon: [name], MD;  Location: [facility];   Service: Cardiovascular;  Laterality: Left;  right superficial femoral, and left iliac   PERIPHERAL VASCULAR INTERVENTION Left 07/18/2019   Procedure: PERIPHERAL VASCULAR INTERVENTION;  Surgeon: [name], MD;  Location: [facility];  Service: Cardiovascular;  Laterality: Left;  external iliac   REDUCTION MAMMAPLASTY  2002   TONSILLECTOMY     TUBAL LIGATION  1996   VAGINAL HYSTERECTOMY  08/04/2005   and cysto     Social History:   reports that she quit smoking about 4 years ago. Her smoking use included cigarettes. She has never used smokeless tobacco. She reports current drug use. Frequency: 2.00 times per week. Drug: Marijuana. She reports that she does not drink alcohol.   Family History:  Her family history includes Allergic rhinitis in her brother, father, mother, and sister; Breast cancer in her cousin; Hypertension in her brother, father, and sister.   Allergies Allergies  Allergen Reactions   Penicillins Anaphylaxis, Itching, Swelling, Rash and Other (See Comments)    Swelling of throat & whole mouth  Has patient had a PCN reaction causing immediate rash, facial/tongue/throat swelling, SOB or lightheadedness with hypotension: Yes Has patient had a PCN reaction causing severe rash involving mucus membranes or skin necrosis: No Has patient had a PCN reaction that required hospitalization: No Has patient had a PCN reaction occurring within the last 10 years: No If all of the above answers are "NO", then may proceed with Cephalosporin use.   Repatha [Evolocumab] Itching   Lisinopril Cough     Home Medications  Prior to Admission medications   Medication Sig Start Date End Date Taking? Authorizing Provider  acetaminophen-codeine (TYLENOL #3) 300-30 MG tablet Take 1 tablet by mouth at bedtime as needed for moderate pain. 05/11/21  Newt Minion, MD  albuterol (VENTOLIN HFA) 108 (90 Base) MCG/ACT inhaler Inhale 1-2 puffs into the lungs every 6 (six) hours as needed  for wheezing or shortness of breath. 07/02/20   Mariel Aloe, MD  amLODipine (NORVASC) 10 MG tablet TAKE 1 TABLET BY MOUTH EVERY DAY 09/10/21   [name], MD  aspirin 81 MG chewable tablet Chew 1 tablet (81 mg total) by mouth daily. 08/07/16   Cheryln Manly, NP  atorvastatin (LIPITOR) 40 MG tablet TAKE 1 TABLET BY MOUTH EVERY DAY 12/22/20   [name], MD  B Complex-C-Zn-Folic Acid (DIALYVITE/ZINC) TABS Take 1 tablet by mouth daily. 03/25/20   [provider]  calcitRIOL (ROCALTROL) 0.5 MCG capsule Take 1.5 mcg by mouth daily. 09/11/18   [provider]  calcium acetate (PHOSLO) 667 MG capsule Take 667 mg by mouth 3 (three) times daily with meals. 09/23/17   [provider]  clopidogrel (PLAVIX) 75 MG tablet TAKE 1 TABLET (75 MG TOTAL) BY MOUTH DAILY WITH BREAKFAST. 07/28/20   [name], MD  cyclobenzaprine (FLEXERIL) 10 MG tablet Take 1 tablet (10 mg total) by mouth 2 (two) times daily as needed. 08/06/20   Charlott Rakes, MD  fluticasone furoate-vilanterol (BREO ELLIPTA) 200-25 MCG/ACT AEPB Inhale 1 puff into the lungs daily. 11/05/21   Argentina Donovan, PA-C  gabapentin (NEURONTIN) 300 MG capsule Take 300 mg by mouth 3 (three) times daily. 10/27/21   [provider]  hydrALAZINE (APRESOLINE) 50 MG tablet Take 50 mg by mouth every 8 (eight) hours. 06/18/20   [provider]  HYDROcodone-acetaminophen (NORCO) 10-325 MG tablet Take 0.5 tablets by mouth every 8 (eight) hours as needed for severe pain. 05/10/21   Harris, Abigail, PA-C  ipratropium-albuterol (DUONEB) 0.5-2.5 (3) MG/3ML SOLN Take 3 mLs by nebulization every 6 (six) hours as needed. 07/02/20   Mariel Aloe, MD  iron sucrose (VENOFER) 20 MG/ML injection Iron Sucrose (Venofer) 07/11/19   [provider]  isosorbide mononitrate (IMDUR) 30 MG 24 hr tablet TAKE 0.5 TABLETS (15 MG TOTAL) BY MOUTH DAILY. 07/28/20 10/26/20  [name], MD  lanthanum (FOSRENOL) 1000  MG chewable tablet Chew 1,000 mg by mouth 5 (five) times daily as needed. 10/11/21   [provider]  Methoxy PEG-Epoetin Beta (MIRCERA IJ) Inject into the skin. 07/09/19   [provider]  metoprolol succinate (TOPROL-XL) 50 MG 24 hr tablet TAKE 1.5 TABLETS BY MOUTH DAILY 07/28/20   [name], MD  Misc. Devices MISC Electric scooter for mobility 06/24/21   Charlott Rakes, MD  montelukast (SINGULAIR) 10 MG tablet Take 1 tablet (10 mg total) by mouth at bedtime. 11/05/21   Argentina Donovan, PA-C  nitroGLYCERIN (NITROSTAT) 0.4 MG SL tablet Place 1 tablet (0.4 mg total) under the tongue every 5 (five) minutes as needed for chest pain. 08/06/19   [name], MD  ondansetron (ZOFRAN ODT) 4 MG disintegrating tablet Take 1 tablet (4 mg total) by mouth every 8 (eight) hours as needed for nausea or vomiting. 07/02/20   Mariel Aloe, MD  ondansetron [facility]) 4 MG tablet Take by mouth. 10/09/21   [provider]  oxyCODONE-acetaminophen (PERCOCET/ROXICET) 5-325 MG tablet Take 1 tablet by mouth every 8 (eight) hours as needed for severe pain. 08/05/21   Suzan Slick, NP  pantoprazole (PROTONIX) 40 MG tablet Take 1 tablet (40 mg total) by mouth daily. 07/02/20   Mariel Aloe, MD  PARoxetine (PAXIL) 20  MG tablet Take 1 tablet (20 mg total) by mouth daily. For hot flashes 07/30/21   Charlott Rakes, MD  PRALUENT 75 MG/ML SOAJ INJECT 75 MG INTO THE SKIN EVERY 14 (FOURTEEN) DAYS. 02/07/20   [name], MD  promethazine (PHENERGAN) 25 MG tablet Take 1 tablet (25 mg total) by mouth every 8 (eight) hours as needed for nausea or vomiting. 07/30/21   Charlott Rakes, MD  sucralfate (CARAFATE) 1 GM/10ML suspension Take 10 mLs (1 g total) by mouth 4 (four) times daily -  with meals and at bedtime. 03/22/20   Jacqlyn Larsen, PA-C     Critical care time: 41 minutes     Georgann Housekeeper, AGACNP-BC [facility]  See Amion for personal pager PCCM on call  pager [phone] until 7pm. Please call Elink 7p-7a. [phone]  11/09/2021 9:30 AM

## 2021-11-09 NOTE — Progress Notes (Signed)
Pharmacy Antibiotic Note  Susan Horton is a 51 y.o. female admitted on 11/08/2021 presenting with fever and chills, concern for sepsis.  Pharmacy has been consulted for cefepime dosing. Concern for left hip cellulitis at site of left hip tumoral calcinosis surgery a few weeks ago, CT hip did not show bleeding or drainable abscess. ESRD-HD usually MWF. Patient was started on CRRT 10/29, which was subsequently stopped on 10/30 with plan to transition back to iHD. Antibiotics de-escalated 10/30.  Plan: -Change cefepime to 1 g IV q24h for iHD -Continue to follow cultures and clinical progress for dose adjustments and de-escalation  Temp (24hrs), Avg:98.5 F (36.9 C), Min:98.2 F (36.8 C), Max:99 F (37.2 C)  Recent Labs  Lab 11/08/21 0929 11/08/21 0930 11/08/21 1247 11/08/21 1634 11/08/21 1635 11/08/21 1911 11/08/21 2355 11/09/21 0310 11/09/21 0403  WBC 12.8*  --   --   --   --   --   --  9.6 11.4*  CREATININE 10.68*  --   --   --  10.66*  --   --  4.18*  4.26*  --   LATICACIDVEN  --  1.7 2.0* 1.5  --  2.5* 1.3  --   --      Estimated Creatinine Clearance: 14.3 mL/min (A) (by C-G formula based on SCr of 4.18 mg/dL (H)).    Allergies  Allergen Reactions   Penicillins Anaphylaxis, Itching, Swelling, Rash and Other (See Comments)    Swelling of throat & whole mouth  Has patient had a PCN reaction causing immediate rash, facial/tongue/throat swelling, SOB or lightheadedness with hypotension: Yes Has patient had a PCN reaction causing severe rash involving mucus membranes or skin necrosis: No Has patient had a PCN reaction that required hospitalization: No Has patient had a PCN reaction occurring within the last 10 years: No If all of the above answers are "NO", then may proceed with Cephalosporin use.   Repatha [Evolocumab] Itching   Lisinopril Cough    Antimicrobials this admission:  10/29 Cefepime >> 10/29 Vancomycin >> 10/30  Microbiology results:  10/29 BCx2:  10/29  MRSA PCR: neg  Jadriel Saxer Burna Mortimer, PharmD, BCPS Clinical Pharmacist 11/09/2021 2:42 PM

## 2021-11-09 NOTE — Progress Notes (Addendum)
[facility] Progress Note Patient Name: Susan Horton DOB: [birth year] MRN: [id number]   Date of Service  11/09/2021  HPI/Events of Note  Camera and discussion about drop in Hg < 6, no gi bleeding. CRRT-ESRD. ? Bacteremia. On cefepime.  Left hip: anteriorly swollen. Looks old from her tumor  hx, recent surgery. Wound in dressings. Did not see any ecchymosis. VS stable. Off of pressors. On room air. Hx of fall, right hip pain recently.   eICU Interventions  - will re confirm low Hg again, if still low,  To transfuse PRBC. Get PT/INR. - consider CTA abdomen, thigh run off for any RPH, hematoma bleeding.      Intervention Category Intermediate Interventions: Other: (anemia and pain in hip)  [name] [name] 11/09/2021, 4:03 AM  4:48 AM   Repeat Hg 6.6, from 5.3. Plan: - will mark left hip swollen area.  Not suspecting RPH or bleed at this time. - 1 PRBC and follow Hg., if dropping or not rising, to consider CTA run off test  - percocet for pain once ordered

## 2021-11-10 DIAGNOSIS — R4182 Altered mental status, unspecified: Secondary | ICD-10-CM | POA: Diagnosis not present

## 2021-11-10 DIAGNOSIS — D631 Anemia in chronic kidney disease: Secondary | ICD-10-CM

## 2021-11-10 DIAGNOSIS — L0390 Cellulitis, unspecified: Secondary | ICD-10-CM

## 2021-11-10 DIAGNOSIS — Z992 Dependence on renal dialysis: Secondary | ICD-10-CM | POA: Diagnosis not present

## 2021-11-10 DIAGNOSIS — N186 End stage renal disease: Secondary | ICD-10-CM | POA: Diagnosis not present

## 2021-11-10 LAB — CBC
HCT: 25.4 % — ABNORMAL LOW (ref 36.0–46.0)
Hemoglobin: 7.4 g/dL — ABNORMAL LOW (ref 12.0–15.0)
MCH: 23.6 pg — ABNORMAL LOW (ref 26.0–34.0)
MCHC: 29.1 g/dL — ABNORMAL LOW (ref 30.0–36.0)
MCV: 80.9 fL (ref 80.0–100.0)
Platelets: 312 10*3/uL (ref 150–400)
RBC: 3.14 MIL/uL — ABNORMAL LOW (ref 3.87–5.11)
RDW: 18.8 % — ABNORMAL HIGH (ref 11.5–15.5)
WBC: 13 10*3/uL — ABNORMAL HIGH (ref 4.0–10.5)
nRBC: 0 % (ref 0.0–0.2)

## 2021-11-10 LAB — GLUCOSE, CAPILLARY
Glucose-Capillary: 109 mg/dL — ABNORMAL HIGH (ref 70–99)
Glucose-Capillary: 153 mg/dL — ABNORMAL HIGH (ref 70–99)
Glucose-Capillary: 102 mg/dL — ABNORMAL HIGH (ref 70–99)
Glucose-Capillary: 103 mg/dL — ABNORMAL HIGH (ref 70–99)
Glucose-Capillary: 79 mg/dL (ref 70–99)
Glucose-Capillary: 142 mg/dL — ABNORMAL HIGH (ref 70–99)

## 2021-11-10 LAB — BASIC METABOLIC PANEL
Anion gap: 12 (ref 5–15)
BUN: 57 mg/dL — ABNORMAL HIGH (ref 6–20)
CO2: 21 mmol/L — ABNORMAL LOW (ref 22–32)
Calcium: 7.7 mg/dL — ABNORMAL LOW (ref 8.9–10.3)
Chloride: 103 mmol/L (ref 98–111)
Creatinine, Ser: 6.22 mg/dL — ABNORMAL HIGH (ref 0.44–1.00)
GFR, Estimated: 8 mL/min — ABNORMAL LOW (ref >60)
Glucose, Bld: 96 mg/dL (ref 70–99)
Potassium: 4.5 mmol/L (ref 3.5–5.1)
Sodium: 136 mmol/L (ref 135–145)

## 2021-11-10 LAB — PROCALCITONIN: Procalcitonin: 27.44 ng/mL

## 2021-11-10 MED ORDER — ONDANSETRON HCL 4 MG/2ML IJ SOLN
4.0000 mg | Freq: Once | INTRAMUSCULAR | Status: AC
  Administered 2021-11-10: 4 mg via INTRAVENOUS
  Filled 2021-11-10: qty 2

## 2021-11-10 MED ORDER — CHLORHEXIDINE GLUCONATE CLOTH 2 % EX PADS: 6.0000 | MEDICATED_PAD | Freq: Every day | CUTANEOUS | Status: DC

## 2021-11-10 MED ORDER — ASPIRIN 81 MG PO CHEW
81.0000 mg | CHEWABLE_TABLET | Freq: Every day | ORAL | Status: DC
  Administered 2021-11-10 – 2021-11-22 (×12): 81 mg via ORAL
  Filled 2021-11-10 (×14): qty 1

## 2021-11-10 MED ORDER — OXYCODONE HCL 5 MG PO TABS
5.0000 mg | ORAL_TABLET | Freq: Four times a day (QID) | ORAL | Status: DC | PRN
  Administered 2021-11-10 – 2021-11-11 (×2): 5 mg via ORAL
  Filled 2021-11-10 (×2): qty 1

## 2021-11-10 MED ORDER — INSULIN ASPART 100 UNIT/ML IJ SOLN: 0.0000 [IU] | Freq: Every day | INTRAMUSCULAR | Status: DC

## 2021-11-10 MED ORDER — FLUTICASONE FUROATE-VILANTEROL 200-25 MCG/ACT IN AEPB
1.0000 | INHALATION_SPRAY | Freq: Every day | RESPIRATORY_TRACT | Status: DC
  Administered 2021-11-16: 1 via RESPIRATORY_TRACT
  Filled 2021-11-10 (×3): qty 28

## 2021-11-10 MED ORDER — CLOPIDOGREL BISULFATE 75 MG PO TABS
75.0000 mg | ORAL_TABLET | Freq: Every day | ORAL | Status: DC
  Administered 2021-11-12 – 2021-11-22 (×11): 75 mg via ORAL
  Filled 2021-11-10 (×14): qty 1

## 2021-11-10 MED ORDER — INSULIN ASPART 100 UNIT/ML IJ SOLN: 0.0000 [IU] | Freq: Three times a day (TID) | INTRAMUSCULAR | Status: DC

## 2021-11-10 NOTE — Progress Notes (Signed)
NAME:  Susan Horton, MRN:  [id number], DOB:  17-May-1970, LOS: 2 ADMISSION DATE:  11/08/2021,  CHIEF COMPLAINT:  weakness, fever/chills   History of Present Illness:  51 year old woman, former smoker, with end-stage renal disease on HD, CAD, hypertension with systolic and diastolic CHF, diabetes complicated by peripheral neuropathy and gastroparesis, lower extremity DVT, asthma.  She underwent recent excision of tumoral calcinosis left hip/thigh, drain and sutures removed on 10/20/2021 without any evidence of infection per office notes.   She was brought to the emergency department 10/29 with 3 days of progressive fatigue, weakness.  She apparently suffered a mechanical fall on 10/27 with resultant R hip pain.  In the ED noted to be febrile, lethargic/confused, tachycardic.  Last HD 10/27 was a partial run.  Evaluation consistent with sepsis, mild leukocytosis.  Acute on chronic renal failure with uremia (117) and hyperkalemia 6.9.  Lactic acid reassuring 1.7.  COVID-19 negative.  Blood cultures sent and broad-spectrum antibiotics initiated >> cefepime, Flagyl, vancomycin.   Pertinent  Medical History   has a past medical history of Abnormal stress test, Anemia, Angio-edema, Asthma, CAD (coronary artery disease), Cataract, CKD (chronic kidney disease) stage 4, GFR 15-29 ml/min ([place]), DVT (deep venous thrombosis) ([place]), Eczema, Essential hypertension (12/11/2015), Gastroparesis, GERD (gastroesophageal reflux disease), Gluteal abscess (12/27/2018), Hidradenitis, Migraine, Neuropathy, Peripheral vascular disease ([place]), Progressive angina ([place]) (06/05/2014), Type 2 diabetes mellitus ([place]), Type II diabetes mellitus ([place]), and Urticaria.   Significant Hospital Events: Including procedures, antibiotic start and stop dates in addition to other pertinent events   10/29 admit for emergent HD 10/30 CRRT stopped for CT when filter finished. Not restarted. Tx pending to [facility] for HD. 10/30 CT hip   Interim  History / Subjective:  No acute events overnight. Continues to complain for neuropathic pain in the lower extremities despite gabapentin. Vomiting x 1 this morning.   Objective   Blood pressure (!) 127/46, pulse (!) 102, temperature 100.3 F (37.9 C), temperature source Oral, resp. rate 12, SpO2 97 %.        Intake/Output Summary (Last 24 hours) at 11/10/2021 0920 Last data filed at 11/09/2021 2100 Gross per 24 hour  Intake 440 ml  Output --  Net 440 ml    There were no vitals filed for this visit.  Examination:  General: Middle aged female in NAD HENT: [place]/AT, PERRL, no JVD Lungs: Clear bilateral breath sounds Cardiovascular: RRR, no MRG Abdomen: Soft, NT, ND Extremities: L hip dressing clean and dry. No edema. Moving all extremities.  Neuro: Alert, oriented, non-focal.   Resolved Hospital Problem list     Assessment & Plan:   Septic shock.  Source is likely left hip cellulitis as described on CT.   Hemoglobin drop overnight raising concern for unknown site of hemorrhage ? RP bleed, which was ruled out on CT.  Procalcitonin increased this morning.  Bacteremia a risk as well. Echo with newly reduced LVEF 40-45%.  - Cultures pending - Broad-spectrum antibiotics, continue cefepime and vancomycin pending culture data to guide therapy - PCT up, but clinically better. Will monitor closely and await cultures before escalating antibiotics.   Acute on chronic renal failure, ESRD on HD.  Only had a partial HD run on 10/27.   Hyperkalemia Uremia -HD catheter placed 10/29 for emergent HD -Nephrology following.  -Calcitriol (history calcinosis) -Awaiting [facility] bed for HD. If none available will have to remain in ICU here for possible CRRT.   Anemia: etiology unclear. Recent baseline hemoglobin seems to be in  the 9.5 range. 6.4 overnight. Transfused one unit PRBC.  - Trend hemoglobin - Transfuse as indicated - CT abdomen to rule out RP bleed  Toxic metabolic encephalopathy due to  the above. Improved - Limit sedating medications - Continue to monitor.   CAD, PVD - Restart home asa and plavix  Asthma -Resume home Breo -PRN albuterol -Singulair  Diabetes mellitus - Sign scale insulin per protocol, CBG goal <180 - Restart home gabapentin.   Tumoral calcinosis, recent left thigh/hip surgery - Seen by wound care team with recommendations made.  - CT unchanged. Monitor   Lower extremity pain: neuropathic pain, chronic - home gabapenitn resumed.  - PRN tylenol. Oxy for breakthrough low dose.    Best Practice (right click and "Reselect all SmartList Selections" daily)   Diet/type: Regular consistency (see orders) DVT prophylaxis: other on hold pending CT and hemoglobin trend. No SCDs due to neuropathy.  GI prophylaxis: PPI Lines: Dialysis Catheter Foley:  Yes, and it is still needed Code Status:  full code Last date of multidisciplinary goals of care discussion [discussed status and goals for care with the patient's mother at bedside 10/29.  Full scope of care desired.]  Labs   CBC: Recent Labs  Lab 11/08/21 0929 11/09/21 0310 11/09/21 0403 11/09/21 1030 11/10/21 0408  WBC 12.8* 9.6 11.4*  --  13.0*  HGB 8.8* 5.3* 6.6* 7.7* 7.4*  HCT 29.9* 18.3* 23.1* 25.9* 25.4*  MCV 79.3* 79.2* 79.4*  --  80.9  PLT 375 237 278  --  312     Basic Metabolic Panel: Recent Labs  Lab 11/08/21 0929 11/08/21 1635 11/09/21 0310  NA 138 136 137  135  K 6.9* 6.1* 3.9  3.8  CL 97* 100 106  105  CO2 17* 16* 17*  17*  GLUCOSE 92 117* 72  71  BUN 117* 113* 47*  49*  CREATININE 10.68* 10.66* 4.18*  4.26*  CALCIUM 6.9* 6.7* 7.2*  7.2*  MG  --   --  1.7  PHOS  --  9.9* 4.5  4.7*    GFR: Estimated Creatinine Clearance: 14.3 mL/min (A) (by C-G formula based on SCr of 4.18 mg/dL (H)). Recent Labs  Lab 11/08/21 0929 11/08/21 0930 11/08/21 1247 11/08/21 1633 11/08/21 1634 11/08/21 1911 11/08/21 2355 11/09/21 0310 11/09/21 0403 11/10/21 0408   PROCALCITON  --   --   --  9.57  --   --   --  11.26  --  27.44  WBC 12.8*  --   --   --   --   --   --  9.6 11.4* 13.0*  LATICACIDVEN  --    < > 2.0*  --  1.5 2.5* 1.3  --   --   --    < > = values in this interval not displayed.     Liver Function Tests: Recent Labs  Lab 11/08/21 0929 11/08/21 1635 11/09/21 0310  AST 21  --  39  ALT 9  --  17  ALKPHOS 171*  --  160*  BILITOT 0.6  --  0.6  PROT 7.2  --  4.6*  ALBUMIN 2.4* 2.5* 1.6*  1.6*    No results for input(s): "LIPASE", "AMYLASE" in the last 168 hours. No results for input(s): "AMMONIA" in the last 168 hours.  ABG    Component Value Date/Time   HCO3 25.2 (H) 03/21/2013 1906   TCO2 30 07/18/2019 1111   ACIDBASEDEF 1.0 03/21/2013 1906   O2SAT 36.0 03/21/2013 1906  Coagulation Profile: Recent Labs  Lab 11/08/21 1250 11/09/21 0422  INR 1.2 1.3*     Cardiac Enzymes: No results for input(s): "CKTOTAL", "CKMB", "CKMBINDEX", "TROPONINI" in the last 168 hours.  HbA1C: HbA1c, POC (controlled diabetic range)  Date/Time Value Ref Range Status  07/30/2021 10:57 AM 5.3 0.0 - 7.0 % Final  03/22/2018 04:18 PM 9.8 (A) 0.0 - 7.0 % Final   Hgb A1c MFr Bld  Date/Time Value Ref Range Status  06/30/2020 02:46 AM 4.9 4.8 - 5.6 % Final    Comment:    (NOTE)         Prediabetes: 5.7 - 6.4         Diabetes: >6.4         Glycemic control for adults with diabetes: <7.0   12/26/2018 10:47 AM 7.5 (H) 4.8 - 5.6 % Final    Comment:    (NOTE) Pre diabetes:          5.7%-6.4% Diabetes:              >6.4% Glycemic control for   <7.0% adults with diabetes     CBG: Recent Labs  Lab 11/09/21 1557 11/09/21 1954 11/09/21 2343 11/10/21 0404 11/10/21 0723  GLUCAP 132* 184* 153* 109* 102*     Review of Systems:   Patient unable to give  Past Medical History:  She,  has a past medical history of Abnormal stress test, Anemia, Angio-edema, Asthma, CAD (coronary artery disease), Cataract, CKD (chronic kidney  disease) stage 4, GFR 15-29 ml/min ([facility]), DVT (deep venous thrombosis) ([place]), Eczema, Essential hypertension (12/11/2015), Gastroparesis, GERD (gastroesophageal reflux disease), Gluteal abscess (12/27/2018), Hidradenitis, Migraine, Neuropathy, Peripheral vascular disease ([place]), Progressive angina ([place]) (06/05/2014), Type 2 diabetes mellitus ([place]), Type II diabetes mellitus ([place]), and Urticaria.   Surgical History:   Past Surgical History:  Procedure Laterality Date   A/V FISTULAGRAM N/A 06/22/2017   Procedure: A/V FISTULAGRAM - left arm;  Surgeon: [name], MD;  Location: [facility];  Service: Cardiovascular;  Laterality: N/A;   ABDOMINAL AORTOGRAM W/LOWER EXTREMITY Bilateral 12/14/2018   Procedure: ABDOMINAL AORTOGRAM W/LOWER EXTREMITY;  Surgeon: [name], MD;  Location: [facility];  Service: Cardiovascular;  Laterality: Bilateral;   ABDOMINAL AORTOGRAM W/LOWER EXTREMITY N/A 04/25/2019   Procedure: ABDOMINAL AORTOGRAM W/LOWER EXTREMITY;  Surgeon: [name], MD;  Location: [facility];  Service: Cardiovascular;  Laterality: N/A;   ABDOMINAL AORTOGRAM W/LOWER EXTREMITY Left 07/18/2019   Procedure: ABDOMINAL AORTOGRAM W/LOWER EXTREMITY;  Surgeon: [name], MD;  Location: [facility];  Service: Cardiovascular;  Laterality: Left;   ABDOMINOPLASTY     ADENOIDECTOMY     APPENDECTOMY  1995   AV FISTULA PLACEMENT Left 02/01/2017   Procedure: ARTERIOVENOUS BRACHIOCEPHALIC (AV) FISTULA CREATION;  Surgeon: [name], MD;  Location: [facility];  Service: Vascular;  Laterality: Left;   [place] INTERVENTION N/A 08/05/2016   Procedure: Coronary Stent Intervention;  Surgeon: [name], MD;  Location: [facility];  Service: Cardiovascular;  Laterality: N/A;   ESOPHAGOGASTRODUODENOSCOPY (EGD) WITH PROPOFOL N/A 07/01/2020   Procedure:  ESOPHAGOGASTRODUODENOSCOPY (EGD) WITH PROPOFOL;  Surgeon: Gatha Mayer, MD;  Location: [facility];  Service: Endoscopy;  Laterality: N/A;   EXPLORATORY LAPAROTOMY  08/14/2005   lysis of adhesions, drainage of tubo-ovarian abscess   FISTULA SUPERFICIALIZATION Left 09/14/2017   Procedure: FISTULA SUPERFICIALIZATION ARTERIOVENOUS FISTULA LEFT  ARM;  Surgeon: [name], MD;  Location: [facility];  Service: Vascular;  Laterality: Left;   FLEXIBLE SIGMOIDOSCOPY N/A 01/04/2019   Procedure: FLEXIBLE SIGMOIDOSCOPY;  Surgeon: Clarene Essex, MD;  Location: [facility];  Service: Endoscopy;  Laterality: N/A;   HYDRADENITIS EXCISION  02/08/2011   Procedure: EXCISION HYDRADENITIS GROIN;  Surgeon: [name], MD;  Location: [facility];  Service: General;  Laterality: N/A;  Excisioin of Hidradenitis Left groin   INCISION AND DRAINAGE ABSCESS N/A 01/11/2019   Procedure: INCISION AND DRAINAGE SACRAL ABSCESS;  Surgeon: Georganna Skeans, MD;  Location: [facility];  Service: General;  Laterality: N/A;   INGUINAL HIDRADENITIS EXCISION  07/06/2010   bilateral   INSERTION OF DIALYSIS CATHETER N/A 09/14/2017   Procedure: INSERTION OF TUNNELED DIALYSIS CATHETER Right Internal Jugular;  Surgeon: [name], MD;  Location: [facility];  Service: Vascular;  Laterality: N/A;   IR FLUORO GUIDE CV LINE RIGHT  03/01/2019   IR US GUIDE BX ASP/DRAIN  01/01/2019   IR US GUIDE VASC ACCESS RIGHT  03/01/2019   LEFT HEART CATH AND CORONARY ANGIOGRAPHY N/A 08/05/2016   Procedure: Left Heart Cath and Coronary Angiography;  Surgeon: [name], MD;  Location: [facility];  Service: Cardiovascular;  Laterality: N/A;   PERIPHERAL VASCULAR INTERVENTION Left 12/14/2018   Procedure: PERIPHERAL VASCULAR INTERVENTION;  Surgeon: [name], MD;  Location: [facility];  Service: Cardiovascular;  Laterality: Left;  SFA   PERIPHERAL VASCULAR INTERVENTION Left 04/25/2019   Procedure: PERIPHERAL  VASCULAR INTERVENTION;  Surgeon: [name], MD;  Location: [facility];  Service: Cardiovascular;  Laterality: Left;  right superficial femoral, and left iliac   PERIPHERAL VASCULAR INTERVENTION Left 07/18/2019   Procedure: PERIPHERAL VASCULAR INTERVENTION;  Surgeon: [name], MD;  Location: [facility];  Service: Cardiovascular;  Laterality: Left;  external iliac   REDUCTION MAMMAPLASTY  2002   TONSILLECTOMY     TUBAL LIGATION  1996   VAGINAL HYSTERECTOMY  08/04/2005   and cysto     Social History:   reports that she quit smoking about 5 years ago. Her smoking use included cigarettes. She has never used smokeless tobacco. She reports current drug use. Frequency: 2.00 times per week. Drug: Marijuana. She reports that she does not drink alcohol.   Family History:  Her family history includes Allergic rhinitis in her brother, father, mother, and sister; Breast cancer in her cousin; Hypertension in her brother, father, and sister.   Allergies Allergies  Allergen Reactions   Penicillins Anaphylaxis, Itching, Swelling, Rash and Other (See Comments)    Swelling of throat & whole mouth  Has patient had a PCN reaction causing immediate rash, facial/tongue/throat swelling, SOB or lightheadedness with hypotension: Yes Has patient had a PCN reaction causing severe rash involving mucus membranes or skin necrosis: No Has patient had a PCN reaction that required hospitalization: No Has patient had a PCN reaction occurring within the last 10 years: No If all of the above answers are "NO", then may proceed with Cephalosporin use.   Repatha [Evolocumab] Itching   Lisinopril Cough     Home Medications  Prior to Admission medications   Medication Sig Start Date End Date Taking? Authorizing Provider  acetaminophen-codeine (TYLENOL #3) 300-30 MG tablet Take 1 tablet by mouth at bedtime as needed for moderate pain. 05/11/21   Newt Minion, MD  albuterol (VENTOLIN HFA)  108 (90 Base) MCG/ACT inhaler Inhale 1-2 puffs  into the lungs every 6 (six) hours as needed for wheezing or shortness of breath. 07/02/20   Mariel Aloe, MD  amLODipine (NORVASC) 10 MG tablet TAKE 1 TABLET BY MOUTH EVERY DAY 09/10/21   [name], MD  aspirin 81 MG chewable tablet Chew 1 tablet (81 mg total) by mouth daily. 08/07/16   Cheryln Manly, NP  atorvastatin (LIPITOR) 40 MG tablet TAKE 1 TABLET BY MOUTH EVERY DAY 12/22/20   [name], MD  B Complex-C-Zn-Folic Acid (DIALYVITE/ZINC) TABS Take 1 tablet by mouth daily. 03/25/20   [provider]  calcitRIOL (ROCALTROL) 0.5 MCG capsule Take 1.5 mcg by mouth daily. 09/11/18   [provider]  calcium acetate (PHOSLO) 667 MG capsule Take 667 mg by mouth 3 (three) times daily with meals. 09/23/17   [provider]  clopidogrel (PLAVIX) 75 MG tablet TAKE 1 TABLET (75 MG TOTAL) BY MOUTH DAILY WITH BREAKFAST. 07/28/20   [name], MD  cyclobenzaprine (FLEXERIL) 10 MG tablet Take 1 tablet (10 mg total) by mouth 2 (two) times daily as needed. 08/06/20   Charlott Rakes, MD  fluticasone furoate-vilanterol (BREO ELLIPTA) 200-25 MCG/ACT AEPB Inhale 1 puff into the lungs daily. 11/05/21   Argentina Donovan, PA-C  gabapentin (NEURONTIN) 300 MG capsule Take 300 mg by mouth 3 (three) times daily. 10/27/21   [provider]  hydrALAZINE (APRESOLINE) 50 MG tablet Take 50 mg by mouth every 8 (eight) hours. 06/18/20   [provider]  HYDROcodone-acetaminophen (NORCO) 10-325 MG tablet Take 0.5 tablets by mouth every 8 (eight) hours as needed for severe pain. 05/10/21   [facility], Abigail, PA-C  ipratropium-albuterol (DUONEB) 0.5-2.5 (3) MG/3ML SOLN Take 3 mLs by nebulization every 6 (six) hours as needed. 07/02/20   Mariel Aloe, MD  iron sucrose (VENOFER) 20 MG/ML injection Iron Sucrose (Venofer) 07/11/19   [provider]  isosorbide mononitrate (IMDUR) 30 MG 24 hr tablet TAKE 0.5 TABLETS (15  MG TOTAL) BY MOUTH DAILY. 07/28/20 10/26/20  [name], MD  lanthanum (FOSRENOL) 1000 MG chewable tablet Chew 1,000 mg by mouth 5 (five) times daily as needed. 10/11/21   [provider]  Methoxy PEG-Epoetin Beta (MIRCERA IJ) Inject into the skin. 07/09/19   [provider]  metoprolol succinate (TOPROL-XL) 50 MG 24 hr tablet TAKE 1.5 TABLETS BY MOUTH DAILY 07/28/20   [name], MD  Misc. Devices MISC Electric scooter for mobility 06/24/21   Charlott Rakes, MD  montelukast (SINGULAIR) 10 MG tablet Take 1 tablet (10 mg total) by mouth at bedtime. 11/05/21   Argentina Donovan, PA-C  nitroGLYCERIN (NITROSTAT) 0.4 MG SL tablet Place 1 tablet (0.4 mg total) under the tongue every 5 (five) minutes as needed for chest pain. 08/06/19   [name], MD  ondansetron (ZOFRAN ODT) 4 MG disintegrating tablet Take 1 tablet (4 mg total) by mouth every 8 (eight) hours as needed for nausea or vomiting. 07/02/20   Mariel Aloe, MD  ondansetron [facility]) 4 MG tablet Take by mouth. 10/09/21   [provider]  oxyCODONE-acetaminophen (PERCOCET/ROXICET) 5-325 MG tablet Take 1 tablet by mouth every 8 (eight) hours as needed for severe pain. 08/05/21   Suzan Slick, NP  pantoprazole (PROTONIX) 40 MG tablet Take 1 tablet (40 mg total) by mouth daily. 07/02/20   Mariel Aloe, MD  PARoxetine (PAXIL) 20 MG tablet Take 1 tablet (20 mg total) by mouth daily. For hot flashes 07/30/21  Newlin, Enobong, MD  PRALUENT 75 MG/ML SOAJ INJECT 75 MG INTO THE SKIN EVERY 14 (FOURTEEN) DAYS. 02/07/20   [name], MD  promethazine (PHENERGAN) 25 MG tablet Take 1 tablet (25 mg total) by mouth every 8 (eight) hours as needed for nausea or vomiting. 07/30/21   Charlott Rakes, MD  sucralfate (CARAFATE) 1 GM/10ML suspension Take 10 mLs (1 g total) by mouth 4 (four) times daily -  with meals and at bedtime. 03/22/20   Jacqlyn Larsen, PA-C     Critical care time:      Georgann Housekeeper,  AGACNP-BC [facility]  See Amion for personal pager PCCM on call pager [phone] until 7pm Please call Elink 7p-7a. [phone]  11/10/2021 9:20 AM

## 2021-11-10 NOTE — Progress Notes (Signed)
[name] Progress Note  Subjective: pt feeling good but having worsening jerking of her UE's.   Summary: ESRD on HD [facility] MWF, HTN, HL, DM, CAD, gastroparesis, PAD, h/o DVT, L tumoral calcinosis excision 10/10/26 who presented with fever and AMS and nephrology was consulted for RRT in light of ESRD and K 6.9.   Vitals:   11/10/21 1300 11/10/21 1400 11/10/21 1500 11/10/21 1600  BP: (!) 111/57 (!) 131/53 (!) 100/42 123/68  Pulse: (!) 25 (!) 104    Resp: '13 16 14 '$ (!) 24  Temp:    98.7 F (37.1 C)  TempSrc:      SpO2:  100%      Exam:  alert, nad   no jvd  Chest cta bilat  Cor reg no RG  Abd soft ntnd no ascites   Ext no LE edema   Alert, NF, ox3, +myoclonic jerking at rest    LUE AVF+bruit, no erythema   OP HD: [facility] 3h 54mn 400/600  61kg  2/2 bath P2  Hep 4000  LUE AVF - stays on the machine, missed 1 HD in past 3 wks - mircera 200 q2, last 10/16, due 10/30 - venofer '100mg'$  tiw thru 11/06 - doxercalciferol 7 ug tiw - sod thiosulfate 25 gm iv tiw  Assessment/ Plan: Septic shock: high fevers, cx's pending. Looks much better. Getting IV abx w/ cefepime/ flagyl/ vanc IV. Off of pressors.  Hyperkalemia: resolved w/ CRRT ESRD on HD: CRRT started on 10/29 due to AMS, high BUN, etc. Doing much better so DC'd CRRT Monday. [facility] Will keep temp cath in another 24 hrs to be sure she doesn't need to go back on CRRT.  AGMA: secondary to ESRD and septic shock. Better.  Encephalopathy: shock and uremia, care per above. Myoclonus: due to high dose gabapentin in esrd pt. Told the pt we must stop it to correct the problem and she agrees. DC'd gabapentin at 300 tid (max esrd = '300mg'$ ).   BMM: cont outpt fosrenol  when eating Anemia of esrd: Hb 8.8 was 10 on 10/23, on mircera 200 q2 weeks, last dose 10/16, due today, will order 200 ug sq weekly. Holding IV iron with sepsis.  CAD/ PAD DM2   Rob Azucena Dart 11/10/2021, 7:53 PM   Recent Labs  Lab  11/08/21 1635 11/09/21 0310 11/09/21 0403 11/09/21 1030 11/10/21 0408 11/10/21 0924  HGB  --  5.3*   < > 7.7* 7.4*  --   ALBUMIN 2.5* 1.6*  1.6*  --   --   --   --   CALCIUM 6.7* 7.2*  7.2*  --   --   --  7.7*  PHOS 9.9* 4.5  4.7*  --   --   --   --   CREATININE 10.66* 4.18*  4.26*  --   --   --  6.22*  K 6.1* 3.9  3.8  --   --   --  4.5   < > = values in this interval not displayed.    Recent Labs  Lab 11/09/21 1230  IRON 22*  TIBC 127*  FERRITIN 5,455*   Inpatient medications:  aspirin  81 mg Oral Daily   calcitRIOL  1.5 mcg Oral Daily   calcium acetate  667 mg Oral TID WC   Chlorhexidine Gluconate Cloth  6 each Topical Daily   [START ON 11/11/2021] clopidogrel  75 mg Oral Q breakfast   darbepoetin (ARANESP) injection - NON-DIALYSIS  200 mcg Subcutaneous Q Mon-1800   fluticasone furoate-vilanterol  1 puff Inhalation Daily   insulin aspart  0-6 Units Subcutaneous Q4H   montelukast  10 mg Oral QHS   pantoprazole (PROTONIX) IV  40 mg Intravenous QHS    ceFEPime (MAXIPIME) IV Stopped (11/10/21 1223)   norepinephrine (LEVOPHED) Adult infusion Stopped (11/08/21 1927)   ipratropium-albuterol, loperamide, mouth rinse, oxyCODONE

## 2021-11-10 NOTE — Progress Notes (Signed)
[facility] Progress Note Patient Name: Susan Horton DOB: 1970-09-11 MRN: [id number]   Date of Service  11/10/2021  HPI/Events of Note  Patient eating a PO diet - Nursing request to change Novolog SSI from Q 4 hours to AC/HS.   eICU Interventions  Plan: D/C Q 4 hour Novolog SSI. AC/HS very sensitive Novolog SSI.     Intervention Category Major Interventions: Hyperglycemia - active titration of insulin therapy  Danicia Terhaar Eugene 11/10/2021, 11:22 PM

## 2021-11-10 NOTE — TOC Progression Note (Signed)
Transition of Care [facility]) - Progression Note    Patient Details  Name: Susan Horton MRN: [id number] Date of Birth: [birth year]  Transition of Care [facility]) CM/SW Contact  Purcell Mouton, RN Phone Number: 11/10/2021, 11:54 AM  Clinical Narrative:       Transition of Care (TOC) Screening Note   Patient Details  Name: Susan Horton Date of Birth: [birth year]   Transition of Care [facility]) CM/SW Contact:    Purcell Mouton, RN Phone Number: 11/10/2021, 11:54 AM  Pt will transfer to [facility] for HD on Wednesday. Pt lives alone.    Transition of Care Department [facility]) has reviewed patient and no TOC needs have been identified at this time. We will continue to monitor patient advancement through interdisciplinary progression rounds. If new patient transition needs arise, please place a TOC consult.        Expected Discharge Plan and Services                                                 Social Determinants of Health (SDOH) Interventions    Readmission Risk Interventions     No data to display

## 2021-11-11 DIAGNOSIS — G9341 Metabolic encephalopathy: Secondary | ICD-10-CM

## 2021-11-11 DIAGNOSIS — A419 Sepsis, unspecified organism: Secondary | ICD-10-CM

## 2021-11-11 DIAGNOSIS — R4182 Altered mental status, unspecified: Secondary | ICD-10-CM | POA: Diagnosis not present

## 2021-11-11 LAB — BASIC METABOLIC PANEL
Anion gap: 13 (ref 5–15)
BUN: 67 mg/dL — ABNORMAL HIGH (ref 6–20)
CO2: 21 mmol/L — ABNORMAL LOW (ref 22–32)
Calcium: 8 mg/dL — ABNORMAL LOW (ref 8.9–10.3)
Chloride: 103 mmol/L (ref 98–111)
Creatinine, Ser: 7.62 mg/dL — ABNORMAL HIGH (ref 0.44–1.00)
GFR, Estimated: 6 mL/min — ABNORMAL LOW (ref >60)
Glucose, Bld: 137 mg/dL — ABNORMAL HIGH (ref 70–99)
Potassium: 5.2 mmol/L — ABNORMAL HIGH (ref 3.5–5.1)
Sodium: 137 mmol/L (ref 135–145)

## 2021-11-11 LAB — BLOOD GAS, VENOUS
Acid-base deficit: 5.4 mmol/L — ABNORMAL HIGH (ref 0.0–2.0)
Bicarbonate: 20.6 mmol/L (ref 20.0–28.0)
O2 Saturation: 62.2 %
Patient temperature: 37
pCO2, Ven: 41 mmHg — ABNORMAL LOW (ref 44–60)
pH, Ven: 7.31 (ref 7.25–7.43)
pO2, Ven: 40 mmHg (ref 32–45)

## 2021-11-11 LAB — RENAL FUNCTION PANEL
Albumin: 2.2 g/dL — ABNORMAL LOW (ref 3.5–5.0)
Anion gap: 13 (ref 5–15)
BUN: 72 mg/dL — ABNORMAL HIGH (ref 6–20)
CO2: 21 mmol/L — ABNORMAL LOW (ref 22–32)
Calcium: 7.9 mg/dL — ABNORMAL LOW (ref 8.9–10.3)
Chloride: 102 mmol/L (ref 98–111)
Creatinine, Ser: 7.54 mg/dL — ABNORMAL HIGH (ref 0.44–1.00)
GFR, Estimated: 6 mL/min — ABNORMAL LOW (ref >60)
Glucose, Bld: 137 mg/dL — ABNORMAL HIGH (ref 70–99)
Phosphorus: 9 mg/dL — ABNORMAL HIGH (ref 2.5–4.6)
Potassium: 5.2 mmol/L — ABNORMAL HIGH (ref 3.5–5.1)
Sodium: 136 mmol/L (ref 135–145)

## 2021-11-11 LAB — CBC
HCT: 25.9 % — ABNORMAL LOW (ref 36.0–46.0)
Hemoglobin: 7.6 g/dL — ABNORMAL LOW (ref 12.0–15.0)
MCH: 23.6 pg — ABNORMAL LOW (ref 26.0–34.0)
MCHC: 29.3 g/dL — ABNORMAL LOW (ref 30.0–36.0)
MCV: 80.4 fL (ref 80.0–100.0)
Platelets: 327 10*3/uL (ref 150–400)
RBC: 3.22 MIL/uL — ABNORMAL LOW (ref 3.87–5.11)
RDW: 19.1 % — ABNORMAL HIGH (ref 11.5–15.5)
WBC: 13 10*3/uL — ABNORMAL HIGH (ref 4.0–10.5)
nRBC: 0 % (ref 0.0–0.2)

## 2021-11-11 LAB — CK: Total CK: 38 U/L (ref 38–234)

## 2021-11-11 LAB — PHOSPHORUS: Phosphorus: 9.1 mg/dL — ABNORMAL HIGH (ref 2.5–4.6)

## 2021-11-11 LAB — GLUCOSE, CAPILLARY
Glucose-Capillary: 131 mg/dL — ABNORMAL HIGH (ref 70–99)
Glucose-Capillary: 114 mg/dL — ABNORMAL HIGH (ref 70–99)
Glucose-Capillary: 104 mg/dL — ABNORMAL HIGH (ref 70–99)
Glucose-Capillary: 74 mg/dL (ref 70–99)
Glucose-Capillary: 96 mg/dL (ref 70–99)

## 2021-11-11 LAB — PROCALCITONIN: Procalcitonin: 25.41 ng/mL

## 2021-11-11 LAB — HEMOGLOBIN A1C
Hgb A1c MFr Bld: 5 % (ref 4.8–5.6)
Mean Plasma Glucose: 96.8 mg/dL

## 2021-11-11 LAB — MAGNESIUM: Magnesium: 2.5 mg/dL — ABNORMAL HIGH (ref 1.7–2.4)

## 2021-11-11 LAB — AMMONIA: Ammonia: 30 umol/L (ref 9–35)

## 2021-11-11 LAB — HEPATITIS B SURFACE ANTIGEN: Hepatitis B Surface Ag: NONREACTIVE

## 2021-11-11 MED ORDER — ONDANSETRON HCL 4 MG/2ML IJ SOLN
INTRAMUSCULAR | Status: AC
  Filled 2021-11-11: qty 2

## 2021-11-11 MED ORDER — DIPHENHYDRAMINE HCL 50 MG/ML IJ SOLN
INTRAMUSCULAR | Status: AC
  Filled 2021-11-11: qty 1

## 2021-11-11 MED ORDER — HEPARIN SODIUM (PORCINE) 5000 UNIT/ML IJ SOLN
5000.0000 [IU] | Freq: Three times a day (TID) | INTRAMUSCULAR | Status: DC
  Administered 2021-11-11 – 2021-11-15 (×10): 5000 [IU] via SUBCUTANEOUS
  Filled 2021-11-11 (×10): qty 1

## 2021-11-11 MED ORDER — ONDANSETRON HCL 4 MG/2ML IJ SOLN
4.0000 mg | Freq: Once | INTRAMUSCULAR | Status: AC
  Administered 2021-11-11: 4 mg via INTRAVENOUS

## 2021-11-11 MED ORDER — DIPHENHYDRAMINE HCL 50 MG/ML IJ SOLN
25.0000 mg | Freq: Once | INTRAMUSCULAR | Status: AC
  Administered 2021-11-11: 25 mg via INTRAVENOUS

## 2021-11-11 MED ORDER — PIPERACILLIN-TAZOBACTAM IN DEX 2-0.25 GM/50ML IV SOLN
2.2500 g | Freq: Three times a day (TID) | INTRAVENOUS | Status: DC
  Administered 2021-11-11 – 2021-11-17 (×17): 2.25 g via INTRAVENOUS
  Filled 2021-11-11 (×21): qty 50

## 2021-11-11 NOTE — Progress Notes (Signed)
PT Cancellation Note  Patient Details Name: Susan Horton MRN: [id number] DOB: 1970/09/11   Cancelled Treatment:    Reason Eval/Treat Not Completed: (P) Medical issues which prohibited therapy. Pt is scheduled to transfer to [facility] for HD and return to [place] long; per RN pt lethargic and requesting hold until return. We will follow up as schedule and pt status allows which will likely be another day.    Coolidge Breeze, PT, DPT [facility] Department Office: [phone] Weekend pager: [phone] Coolidge Breeze 11/11/2021, 12:17 PM

## 2021-11-11 NOTE — Progress Notes (Signed)
Pt transferred to [facility] HD unit via [facility], report called to receiving nurses. Pt belongings left in [facility] ICU in patient belongings bag with Pt stickers attatched. Ventura Bruns, RN 11/11/21 2:15 PM

## 2021-11-11 NOTE — Progress Notes (Signed)
Pt arrived from [place] via ems on stretcher, alert and oriented. Able to make needs known. No symptoms of distress noted. Transferred from stretcher to bed with ems staff x3. Denies any pain at this time. Spoke with provider and notified of pts arrival to hd unit. Provider only wants to clean blood with no uf. Will continue to monitor and tx as indicated.

## 2021-11-11 NOTE — Progress Notes (Signed)
OT Cancellation Note  Patient Details Name: RAYSHAWN VISCONTI MRN: [id number] DOB: 30-Jun-1970   Cancelled Treatment:    Reason Eval/Treat Not Completed: Medical issues which prohibited therapy. Hold per RN due to lethargy. Plan is for patient to transfer to [facility] for HD and then return to [place].   Aki Burdin L Algernon Mundie 11/11/2021, 12:14 PM

## 2021-11-11 NOTE — Progress Notes (Signed)
[facility] Progress Note  Subjective: pt looks worse w/ worsening of her myoclonic jerking. She was getting IV cefipime which was stopped after last dose yesterday. She has been getting a few doses of po oxycodone.  Gabapentin dc'd yesterday. ABG and ammonia ordered.   Summary: ESRD on HD [facility] MWF, HTN, HL, DM, CAD, gastroparesis, PAD, h/o DVT, L tumoral calcinosis excision 10/10/26 who presented with fever and AMS and nephrology was consulted for RRT in light of ESRD and K 6.9.   Vitals:   11/11/21 1430 11/11/21 1500 11/11/21 1530 11/11/21 1600  BP: (!) 106/49 (!) 87/53 (!) 118/56 (!) 108/54  Pulse: (!) 106 (!) 102 100 (!) 104  Resp: '11 13 14 13  '$ Temp:      TempSrc:      SpO2: 97% 98% 98% 99%  Weight:        Exam:  alert, nad   no jvd  Chest cta bilat  Cor reg no RG  Abd soft ntnd no ascites   Ext no LE edema   Alert, NF, ox3, +myoclonic jerking at rest    LUE AVF+bruit, no erythema   OP HD: MWF [facility] 3h 29mn 400/600  61kg  2/2 bath P2  Hep 4000  LUE AVF - stays on the machine, missed 1 HD in past 3 wks - mircera 200 q2, last 10/16, due 10/30 - venofer '100mg'$  tiw thru 11/06 - doxercalciferol 7 ug tiw - sod thiosulfate 25 gm iv tiw  Assessment/ Plan: Septic shock: high fevers, cx's pending. Looks much better. Getting IV abx w/ cefepime/ flagyl/ vanc IV. Off of pressors.  Hyperkalemia: resolved w/ CRRT ESRD on HD: CRRT started on 10/29 due to AMS, high BUN, etc. Doing much better so DC'd CRRT Monday. [facility] Will keep temp cath in until she is more stable.  Encephalopathy: shock and uremia. Pt is Ox 3 now.  Myoclonus: combination of neurotoxicity from gabapentin, oxycodone and IV cefipime, all now dc'd. Hopefully will improve w/ HD BMM: cont outpt fosrenol  when eating Anemia of esrd: Hb 8.8 was 10 on 10/23, on mircera 200 q2 weeks, last dose 10/16, due today, will order 200 ug sq weekly. Holding IV iron with sepsis.  CAD/ PAD DM2   Rob  [name] 11/11/2021, 4:16 PM   Recent Labs  Lab 11/09/21 0310 11/09/21 0403 11/10/21 0408 11/10/21 0924 11/11/21 0256  HGB 5.3*   < > 7.4*  --  7.6*  ALBUMIN 1.6*  1.6*  --   --   --  2.2*  CALCIUM 7.2*  7.2*  --   --  7.7* 8.0*  7.9*  PHOS 4.5  4.7*  --   --   --  9.1*  9.0*  CREATININE 4.18*  4.26*  --   --  6.22* 7.62*  7.54*  K 3.9  3.8  --   --  4.5 5.2*  5.2*   < > = values in this interval not displayed.    Recent Labs  Lab 11/09/21 1230  IRON 22*  TIBC 127*  FERRITIN 5,455*    Inpatient medications:  aspirin  81 mg Oral Daily   calcitRIOL  1.5 mcg Oral Daily   calcium acetate  667 mg Oral TID WC   Chlorhexidine Gluconate Cloth  6 each Topical Daily   clopidogrel  75 mg Oral Q breakfast   darbepoetin (ARANESP) injection - NON-DIALYSIS  200 mcg Subcutaneous Q Mon-1800   diphenhydrAMINE  fluticasone furoate-vilanterol  1 puff Inhalation Daily   heparin injection (subcutaneous)  5,000 Units Subcutaneous Q8H   insulin aspart  0-5 Units Subcutaneous QHS   insulin aspart  0-6 Units Subcutaneous TID WC   montelukast  10 mg Oral QHS   ondansetron       pantoprazole (PROTONIX) IV  40 mg Intravenous QHS    piperacillin-tazobactam (ZOSYN)  IV     diphenhydrAMINE, ipratropium-albuterol, loperamide, ondansetron, mouth rinse, oxyCODONE

## 2021-11-11 NOTE — Progress Notes (Signed)
NAME:  Susan Horton, MRN:  [id number], DOB:  July 21, 1970, LOS: 3 ADMISSION DATE:  11/08/2021,  CHIEF COMPLAINT:  weakness, fever/chills   History of Present Illness:  51 year old woman, former smoker, with end-stage renal disease on HD, CAD, hypertension with systolic and diastolic CHF, diabetes complicated by peripheral neuropathy and gastroparesis, lower extremity DVT, asthma.  She underwent recent excision of tumoral calcinosis left hip/thigh, drain and sutures removed on 10/20/2021 without any evidence of infection per office notes.   She was brought to the emergency department 10/29 with 3 days of progressive fatigue, weakness.  She apparently suffered a mechanical fall on 10/27 with resultant R hip pain.  In the ED noted to be febrile, lethargic/confused, tachycardic.  Last HD 10/27 was a partial run.  Evaluation consistent with sepsis, mild leukocytosis.  Acute on chronic renal failure with uremia (117) and hyperkalemia 6.9.  Lactic acid reassuring 1.7.  COVID-19 negative.  Blood cultures sent and broad-spectrum antibiotics initiated >> cefepime, Flagyl, vancomycin.   Pertinent  Medical History   Past Medical History:  Diagnosis Date   Abnormal stress test    Anemia    Angio-edema    Asthma    CAD (coronary artery disease)    DES to mid LAD July 2018, residual moderate RCA disease   Cataract    CKD (chronic kidney disease) stage 4, GFR 15-29 ml/min (HCC)    Dialysis T/Th/Sa   DVT (deep venous thrombosis) ([place])    1996 during pregnancy, 2015 left leg   Eczema    Essential hypertension 12/11/2015   Gastroparesis    GERD (gastroesophageal reflux disease)    Gluteal abscess 12/27/2018   Hidradenitis    Migraine    Neuropathy    Peripheral vascular disease (HCC)    blood clot in leg   Progressive angina ([place]) 06/05/2014   Chest pain   Type 2 diabetes mellitus ([place])    Type II diabetes mellitus ([place])    Urticaria     Significant Hospital Events: Including procedures,  antibiotic start and stop dates in addition to other pertinent events   10/29 admit to ICU septic shock, needing CRRT 10/31 off pressors 11/1 still pending T to [facility]. More vomiting   Interim History / Subjective:  Remains off pressors   K 5.2, Cr 7.6 Phos 9  WBC 13 Hgb 7.6  Objective   Blood pressure (!) 116/54, pulse (!) 59, temperature 98.8 F (37.1 C), temperature source Axillary, resp. rate (!) 23, weight 68.9 kg, SpO2 93 %.        Intake/Output Summary (Last 24 hours) at 11/11/2021 0725 Last data filed at 11/10/2021 2000 Gross per 24 hour  Intake 700 ml  Output --  Net 700 ml   Filed Weights   11/11/21 0500 11/11/21 0501  Weight: 68.9 kg 68.9 kg    Examination: General: Ill appearing middle aged F NAD  HENT: NCAt pink mm Lungs: even unlabored  Cardiovascular: rrr cap refill < 3 sec  Abdomen: soft ndnt  Extremities: no acute joint deformity. L hip dressing  Neuro: Awake, lethargic, following commands. Tremor   Resolved Hospital Problem list     Assessment & Plan:   Acute encephalopathy, suspect metabolic Myoclonus -check VBG r/o hypercarbia -send ammonia   Severe sepsis -improved shock. Unclear source. concerning for possible bacteremia in patient with vascular access, on HD.   -CT Hip with some soft tissue edema which might reflect mild cellulitis. No fluid collection, hematoma.  -Procal cont to rise  -  Bcx, Ucx were cancelled  P -changing cefepime to zosyn.  -with rising PCT, will cont to trend  -follow fever curve, WBC   - Hold ASA, plavix, and VTE ppx  Biventricular heart failure Severe Tricuspid regurg  Pulmonary hypertension CAD s/p mLAD PCI w DES (2018) PVD  HLD HTN -in 2021 had normal LVEF and g2dd, mild TR. This admission LVEF 45-50, LV global hypokinesis. RV moderately reduced systolic fxn, moderately enlarged RV size, mildly elevated PASP. RVSP 43    P -DAPT  -volume management per RRT  -follows w Dr Gwenlyn Found cards -- ?if might benefit  from [place] at some point given above findings   Acute on chronic renal failure, ESRD on HD.  Only had a partial HD run on 10/27.   Hyperkalemia Hyperphosphatemia  Uremia P -Rrt per nephro. [facility] to [facility] for [facility]HD. If stays at [facility] will need CRRt again as no iHD available  -Calcitriol (history calcinosis) -phoslo  -trend renal indices   Anemia  -CT a/p no RBP P - trend H/H  -starting vte ppx 11/1  Asthma without exacerbation  -breo ellipta, PRN duoneb, singulair  DM2 Diabetic neuropathy  Gastroparesis  -SSI -gabapentin -supportive care. Will give 1x benadryl and see if this helps nausea   Tumoral calcinosis, recent left thigh/hip surgery -Wound care    Best Practice (right click and "Reselect all SmartList Selections" daily)   Diet/type: Regular consistency (see orders) DVT prophylaxis: other on ASA/Plavix. Adding SQ heparin 11/1  GI prophylaxis: PPI Lines: Dialysis Catheter Foley:  Yes, and it is still needed Code Status:  full code Last date of multidisciplinary goals of care discussion [discussed status and goals for care with the patient's mother at bedside 10/29.  Full scope of care desired.]  Labs   CBC: Recent Labs  Lab 11/08/21 0929 11/09/21 0310 11/09/21 0403 11/09/21 1030 11/10/21 0408 11/11/21 0256  WBC 12.8* 9.6 11.4*  --  13.0* 13.0*  HGB 8.8* 5.3* 6.6* 7.7* 7.4* 7.6*  HCT 29.9* 18.3* 23.1* 25.9* 25.4* 25.9*  MCV 79.3* 79.2* 79.4*  --  80.9 80.4  PLT 375 237 278  --  312 119    Basic Metabolic Panel: Recent Labs  Lab 11/08/21 0929 11/08/21 1635 11/09/21 0310 11/10/21 0924 11/11/21 0256  NA 138 136 137  135 136 137  136  K 6.9* 6.1* 3.9  3.8 4.5 5.2*  5.2*  CL 97* 100 106  105 103 103  102  CO2 17* 16* 17*  17* 21* 21*  21*  GLUCOSE 92 117* 72  71 96 137*  137*  BUN 117* 113* 47*  49* 57* 67*  72*  CREATININE 10.68* 10.66* 4.18*  4.26* 6.22* 7.62*  7.54*  CALCIUM 6.9* 6.7* 7.2*  7.2* 7.7* 8.0*  7.9*  MG  --   --  1.7  --   2.5*  PHOS  --  9.9* 4.5  4.7*  --  9.1*  9.0*   GFR: Estimated Creatinine Clearance: 8.6 mL/min (A) (by C-G formula based on SCr of 7.54 mg/dL (H)). Recent Labs  Lab 11/08/21 1247 11/08/21 1633 11/08/21 1634 11/08/21 1911 11/08/21 2355 11/09/21 0310 11/09/21 0403 11/10/21 0408 11/11/21 0256  PROCALCITON  --  9.57  --   --   --  11.26  --  27.44  --   WBC  --   --   --   --   --  9.6 11.4* 13.0* 13.0*  LATICACIDVEN 2.0*  --  1.5 2.5* 1.3  --   --   --   --  Liver Function Tests: Recent Labs  Lab 11/08/21 0929 11/08/21 1635 11/09/21 0310 11/11/21 0256  AST 21  --  39  --   ALT 9  --  17  --   ALKPHOS 171*  --  160*  --   BILITOT 0.6  --  0.6  --   PROT 7.2  --  4.6*  --   ALBUMIN 2.4* 2.5* 1.6*  1.6* 2.2*   No results for input(s): "LIPASE", "AMYLASE" in the last 168 hours. No results for input(s): "AMMONIA" in the last 168 hours.  ABG    Component Value Date/Time   HCO3 25.2 (H) 03/21/2013 1906   TCO2 30 07/18/2019 1111   ACIDBASEDEF 1.0 03/21/2013 1906   O2SAT 36.0 03/21/2013 1906     Coagulation Profile: Recent Labs  Lab 11/08/21 1250 11/09/21 0422  INR 1.2 1.3*    Cardiac Enzymes: No results for input(s): "CKTOTAL", "CKMB", "CKMBINDEX", "TROPONINI" in the last 168 hours.  HbA1C: HbA1c, POC (controlled diabetic range)  Date/Time Value Ref Range Status  07/30/2021 10:57 AM 5.3 0.0 - 7.0 % Final  03/22/2018 04:18 PM 9.8 (A) 0.0 - 7.0 % Final   Hgb A1c MFr Bld  Date/Time Value Ref Range Status  11/11/2021 02:56 AM 5.0 4.8 - 5.6 % Final    Comment:    (NOTE) Pre diabetes:          5.7%-6.4%  Diabetes:              >6.4%  Glycemic control for   <7.0% adults with diabetes   06/30/2020 02:46 AM 4.9 4.8 - 5.6 % Final    Comment:    (NOTE)         Prediabetes: 5.7 - 6.4         Diabetes: >6.4         Glycemic control for adults with diabetes: <7.0     CBG: Recent Labs  Lab 11/10/21 0723 11/10/21 1123 11/10/21 1602  11/10/21 2047 11/11/21 0340  GLUCAP 102* 79 103* 142* 131*   CCT: n/a  Eliseo Gum MSN, AGACNP-BC [place] for pager  11/11/2021, 11:20 AM

## 2021-11-11 NOTE — Progress Notes (Signed)
Received patient in bed to unit.  Alert and oriented.  Informed consent signed and in chart.   Treatment initiated: 1315 Treatment completed: 1717  Patient tolerated well.  Transported back to the room  Alert, without acute distress.  Hand-off given to patient's nurse.   Access used: avf Access issues: none  Total UF removed: 0 Medication(s) given: none Post HD VS: 118/55,98.1,103,15,98% Post HD weight: 68.9kg   [facility]

## 2021-11-11 NOTE — Progress Notes (Signed)
Talked to sister Leandro Reasoner and let her know that the patients personal items were left at [facility] when transport picked her up.  Leandro Reasoner said would go and pick up.

## 2021-11-11 NOTE — Progress Notes (Signed)
NEW ADMISSION NOTE New Admission Note:   Arrival Method: Bed from dialysis Mental Orientation:  X 1 alert to self Telemetry: box 21 Assessment: Completed Skin: surgical scar left hip IV: saline locked Pain: denies Tubes: nine present Safety Measures: Safety Fall Prevention Plan has been given, discussed and signed Admission: Completed [facility] Orientation: Patient has been orientated to the room, unit and staff.  Family: Mother bedside  Orders have been reviewed and implemented. Will continue to monitor the patient. Call light has been placed within reach and bed alarm has been activated.   Berneta Levins, RN

## 2021-11-11 NOTE — Progress Notes (Signed)
[place] Progress Note Patient Name: Susan Horton DOB: [birth year] MRN: [id number]   Date of Service  11/11/2021  HPI/Events of Note  Nursing states that patient does not have a code status in the chart.   eICU Interventions  Will make patient Full Code status.     Intervention Category Major Interventions: Other:  Shahla Betsill Cornelia Copa 11/11/2021, 3:29 AM

## 2021-11-11 NOTE — Progress Notes (Signed)
Pharmacy Antibiotic Note  Susan Horton is a 51 y.o. female admitted on 11/08/2021 presenting with fever and chills, concern for sepsis.  Pharmacy has been consulted for cefepime dosing. Concern for left hip cellulitis at site of left hip tumoral calcinosis surgery a few weeks ago, CT hip did not show bleeding or drainable abscess. ESRD-HD usually MWF. Patient was started on CRRT 10/29, which was subsequently stopped on 10/30 with plan to transition back to iHD. Antibiotics de-escalated 10/30 to cefepime monotherapy. Patient with encephalopathy 11/1, antibiotics changed from cefepime to Zosyn.  Penicillin allergy noted in chart. Patient had skin testing completed December 2020, at which time test was negative and allergy removed from chart. It is unclear exactly when allergy was re-entered and for what reason. I spoke with patient 11/1, she is slow to respond but does answer questions to some degree. She says she does have penicillin allergy, but does not recall skin testing when asked. She can not tell me if she has had an allergic reaction in the past few years or if she is referring to reaction many years ago. Suspect allergy was re-entered in error. Patient does state that she is okay with proceeding with the Zosyn and I have alerted RN to monitor.  Plan: -Stop cefepime -Start Zosyn 2.25 g IV q8h 30 min infusion -Monitor with first dose of Zosyn given above information -Continue to follow clinical progress for dose adjustments and de-escalation  Temp (24hrs), Avg:98.6 F (37 C), Min:97.6 F (36.4 C), Max:99.3 F (37.4 C)  Recent Labs  Lab 11/08/21 0929 11/08/21 0930 11/08/21 1247 11/08/21 1634 11/08/21 1635 11/08/21 1911 11/08/21 2355 11/09/21 0310 11/09/21 0403 11/10/21 0408 11/10/21 0924 11/11/21 0256  WBC 12.8*  --   --   --   --   --   --  9.6 11.4* 13.0*  --  13.0*  CREATININE 10.68*  --   --   --  10.66*  --   --  4.18*  4.26*  --   --  6.22* 7.62*  7.54*  LATICACIDVEN   --  1.7 2.0* 1.5  --  2.5* 1.3  --   --   --   --   --      Estimated Creatinine Clearance: 8.6 mL/min (A) (by C-G formula based on SCr of 7.54 mg/dL (H)).    Allergies  Allergen Reactions   Penicillins Anaphylaxis, Itching, Swelling, Rash and Other (See Comments)    Swelling of throat & whole mouth  Has patient had a PCN reaction causing immediate rash, facial/tongue/throat swelling, SOB or lightheadedness with hypotension: Yes Has patient had a PCN reaction causing severe rash involving mucus membranes or skin necrosis: No Has patient had a PCN reaction that required hospitalization: No Has patient had a PCN reaction occurring within the last 10 years: No If all of the above answers are "NO", then may proceed with Cephalosporin use.   Repatha [Evolocumab] Itching   Lisinopril Cough    Antimicrobials this admission:  11/1 Zosyn >> 10/29 Cefepime >> 11/1 10/29 Vancomycin >> 10/30  Microbiology results:  10/29 MRSA PCR: neg  Tawnya Crook, PharmD, BCPS Clinical Pharmacist 11/11/2021 11:32 AM

## 2021-11-12 ENCOUNTER — Encounter (HOSPITAL_COMMUNITY): Admission: RE | Payer: Self-pay | Source: Home / Self Care

## 2021-11-12 ENCOUNTER — Ambulatory Visit (HOSPITAL_COMMUNITY): Admission: RE | Admit: 2021-11-12 | Payer: Medicare Other | Source: Home / Self Care | Admitting: Vascular Surgery

## 2021-11-12 DIAGNOSIS — E875 Hyperkalemia: Secondary | ICD-10-CM | POA: Diagnosis not present

## 2021-11-12 DIAGNOSIS — A419 Sepsis, unspecified organism: Secondary | ICD-10-CM | POA: Diagnosis not present

## 2021-11-12 LAB — CBC
HCT: 25.4 % — ABNORMAL LOW (ref 36.0–46.0)
Hemoglobin: 7.5 g/dL — ABNORMAL LOW (ref 12.0–15.0)
MCH: 23.7 pg — ABNORMAL LOW (ref 26.0–34.0)
MCHC: 29.5 g/dL — ABNORMAL LOW (ref 30.0–36.0)
MCV: 80.4 fL (ref 80.0–100.0)
Platelets: 376 10*3/uL (ref 150–400)
RBC: 3.16 MIL/uL — ABNORMAL LOW (ref 3.87–5.11)
RDW: 19.5 % — ABNORMAL HIGH (ref 11.5–15.5)
WBC: 13 10*3/uL — ABNORMAL HIGH (ref 4.0–10.5)
nRBC: 0 % (ref 0.0–0.2)

## 2021-11-12 LAB — GLUCOSE, CAPILLARY
Glucose-Capillary: 77 mg/dL (ref 70–99)
Glucose-Capillary: 77 mg/dL (ref 70–99)
Glucose-Capillary: 103 mg/dL — ABNORMAL HIGH (ref 70–99)
Glucose-Capillary: 114 mg/dL — ABNORMAL HIGH (ref 70–99)

## 2021-11-12 LAB — RENAL FUNCTION PANEL
Albumin: 1.9 g/dL — ABNORMAL LOW (ref 3.5–5.0)
Anion gap: 17 — ABNORMAL HIGH (ref 5–15)
BUN: 35 mg/dL — ABNORMAL HIGH (ref 6–20)
CO2: 23 mmol/L (ref 22–32)
Calcium: 8.4 mg/dL — ABNORMAL LOW (ref 8.9–10.3)
Chloride: 94 mmol/L — ABNORMAL LOW (ref 98–111)
Creatinine, Ser: 4.83 mg/dL — ABNORMAL HIGH (ref 0.44–1.00)
GFR, Estimated: 10 mL/min — ABNORMAL LOW (ref >60)
Glucose, Bld: 84 mg/dL (ref 70–99)
Phosphorus: 6.4 mg/dL — ABNORMAL HIGH (ref 2.5–4.6)
Potassium: 4.2 mmol/L (ref 3.5–5.1)
Sodium: 134 mmol/L — ABNORMAL LOW (ref 135–145)

## 2021-11-12 LAB — PROCALCITONIN: Procalcitonin: 21.71 ng/mL

## 2021-11-12 LAB — HEPATITIS B SURFACE ANTIBODY, QUANTITATIVE: Hep B S AB Quant (Post): 41.5 m[IU]/mL (ref >9.9)

## 2021-11-12 LAB — CALCIUM, IONIZED: Calcium, Ionized, Serum: 4.4 mg/dL — ABNORMAL LOW (ref 4.5–5.6)

## 2021-11-12 SURGERY — ABDOMINAL AORTOGRAM W/LOWER EXTREMITY
Anesthesia: LOCAL

## 2021-11-12 MED ORDER — ACETAMINOPHEN 325 MG PO TABS
650.0000 mg | ORAL_TABLET | Freq: Four times a day (QID) | ORAL | Status: DC | PRN
  Administered 2021-11-12 – 2021-11-21 (×11): 650 mg via ORAL
  Filled 2021-11-12 (×12): qty 2

## 2021-11-12 MED ORDER — THIAMINE HCL 100 MG/ML IJ SOLN
100.0000 mg | Freq: Every day | INTRAMUSCULAR | Status: DC
  Administered 2021-11-12 – 2021-11-20 (×9): 100 mg via INTRAVENOUS
  Filled 2021-11-12 (×11): qty 2

## 2021-11-12 NOTE — Plan of Care (Signed)

## 2021-11-12 NOTE — Evaluation (Signed)
Physical Therapy Evaluation Patient Details Name: Susan Horton MRN: [id number] DOB: 14-Nov-1970 Today's Date: 11/12/2021  History of Present Illness  51 y/o female presented to ED on 11/08/21 for progressive fatigue and weakness after fall on 10/27. Admitted for septic shock with unclear origin. Recent L hip tumoral calcinosis surgery a few weeks ago at [facility] with associated drainage from L hip. PMH: ESRD, CAD, HTN, CHF, T2DM with peripheral neuropathy and gastroparesis.  Clinical Impression  Patient admitted with the above. PTA, patient living with sister who was assisting her since L hip surgery and was able to ambulate using RW. Patient presents with weakness, impaired balance, decreased activity tolerance, and impaired cognition. Requires maxA+2 for bed mobility and demos fully body myoclonic jerking but able to maintain sitting balance with close min guard and tolerate sitting EOB ~10 minutes. Oriented to self throughout and delayed to no response to commands during session. Patient will benefit from skilled PT services during acute stay to address listed deficits. Recommend AIR at discharge to maximize functional mobility and safety.        Recommendations for follow up therapy are one component of a multi-disciplinary discharge planning process, led by the attending physician.  Recommendations may be updated based on patient status, additional functional criteria and insurance authorization.  Follow Up Recommendations Acute inpatient rehab (3hours/day)      Assistance Recommended at Discharge Frequent or constant Supervision/Assistance  Patient can return home with the following  A lot of help with walking and/or transfers;A lot of help with bathing/dressing/bathroom;Assistance with cooking/housework;Direct supervision/assist for financial management;Direct supervision/assist for medications management;Assist for transportation;Help with stairs or ramp for entrance    Equipment  Recommendations Other (comment) (TBD)  Recommendations for Other Services  Rehab consult    Functional Status Assessment Patient has had a recent decline in their functional status and demonstrates the ability to make significant improvements in function in a reasonable and predictable amount of time.     Precautions / Restrictions Precautions Precautions: Fall Precaution Comments: fall, recent hip sx to L hip Restrictions Weight Bearing Restrictions: No      Mobility  Bed Mobility Overal bed mobility: Needs Assistance Bed Mobility: Supine to Sit, Sit to Supine     Supine to sit: Max assist, +2 for physical assistance, +2 for safety/equipment Sit to supine: Max assist, +2 for physical assistance, +2 for safety/equipment   General bed mobility comments: able to initiate rolling towards R side but overall maxA+2 to complete    Transfers                   General transfer comment: deferred due to myoclonic jerking seated EOB    Ambulation/Gait                  Stairs            Wheelchair Mobility    Modified Rankin (Stroke Patients Only)       Balance Overall balance assessment: Needs assistance Sitting-balance support: No upper extremity supported, Feet supported Sitting balance-Leahy Scale: Fair Sitting balance - Comments: full body myoclonic jerking while sitting EOB. Initially min-modA to maintain but progressed to very close min guard.                                     Pertinent Vitals/Pain Pain Assessment Pain Assessment: Faces Faces Pain Scale: Hurts even more Pain Location: L hip with  movement Pain Descriptors / Indicators: Grimacing, Guarding Pain Intervention(s): Monitored during session    Home Living Family/patient expects to be discharged to:: Private residence Living Arrangements: Non-relatives/Friends Available Help at Discharge: Family;Available 24 hours/day Type of Home: House Home Access: Level  entry       Home Layout: One level Home Equipment: Rollator (4 wheels);Cane - single point Additional Comments: Patient wil be stayig at sister's home initially.  Above information is sister.    Prior Function Prior Level of Function : Needs assist             Mobility Comments: able to ambulate with RW per mother ADLs Comments: sister assists with ADLs since L hip surgery     Hand Dominance   Dominant Hand: Right    Extremity/Trunk Assessment   Upper Extremity Assessment Upper Extremity Assessment: Defer to OT evaluation    Lower Extremity Assessment Lower Extremity Assessment: Generalized weakness;Difficult to assess due to impaired cognition (L hip pain with movement due to surgery site)    Cervical / Trunk Assessment Cervical / Trunk Assessment: Normal  Communication   Communication: Expressive difficulties  Cognition Arousal/Alertness: Awake/alert Behavior During Therapy: Flat affect Overall Cognitive Status: Impaired/Different from baseline Area of Impairment: Orientation, Attention, Memory, Following commands, Safety/judgement, Awareness, Problem solving                 Orientation Level: Disoriented to, Time, Place, Situation Current Attention Level: Focused Memory: Decreased short-term memory Following Commands: Follows one step commands with increased time, Follows one step commands inconsistently Safety/Judgement: Decreased awareness of safety, Decreased awareness of deficits Awareness: Intellectual Problem Solving: Slow processing, Decreased initiation, Difficulty sequencing, Requires verbal cues, Requires tactile cues General Comments: oriented to self. Follows 1 step commands inconsistently and requires extensive time to respond. At times, responds better to direct commands vs questions or long commands        General Comments General comments (skin integrity, edema, etc.): Mother present and supportive    Exercises     Assessment/Plan     PT Assessment Patient needs continued PT services  PT Problem List Decreased strength;Decreased activity tolerance;Decreased balance;Decreased mobility;Decreased coordination;Decreased cognition;Decreased knowledge of use of DME;Decreased safety awareness;Decreased knowledge of precautions       PT Treatment Interventions DME instruction;Gait training;Functional mobility training;Therapeutic activities;Therapeutic exercise;Balance training;Patient/family education;Neuromuscular re-education    PT Goals (Current goals can be found in the Care Plan section)  Acute Rehab PT Goals Patient Stated Goal: did not state PT Goal Formulation: With family Time For Goal Achievement: 11/26/21 Potential to Achieve Goals: Fair    Frequency Min 3X/week     Co-evaluation   Reason for Co-Treatment: Complexity of the patient's impairments (multi-system involvement)   OT goals addressed during session: ADL's and self-care       AM-PAC PT "6 Clicks" Mobility  Outcome Measure Help needed turning from your back to your side while in a flat bed without using bedrails?: Total Help needed moving from lying on your back to sitting on the side of a flat bed without using bedrails?: Total Help needed moving to and from a bed to a chair (including a wheelchair)?: Total Help needed standing up from a chair using your arms (e.g., wheelchair or bedside chair)?: Total Help needed to walk in hospital room?: Total Help needed climbing 3-5 steps with a railing? : Total 6 Click Score: 6    End of Session   Activity Tolerance: Patient tolerated treatment well Patient left: in bed;with call bell/phone within reach;with  bed alarm set;with family/visitor present Nurse Communication: Mobility status PT Visit Diagnosis: Unsteadiness on feet (R26.81);Muscle weakness (generalized) (M62.81);History of falling (Z91.81);Difficulty in walking, not elsewhere classified (R26.2);Other symptoms and signs involving the  nervous system (B35.789)    Time: 7847-8412 PT Time Calculation (min) (ACUTE ONLY): 25 min   Charges:   PT Evaluation $PT Eval Moderate Complexity: 1 Mod          Jontrell Bushong A. Gilford Rile PT, DPT Acute Rehabilitation Services Office [phone]   Linna Hoff 11/12/2021, 11:21 AM

## 2021-11-12 NOTE — Progress Notes (Signed)
   Inpatient Rehab Admissions Coordinator :  Per therapy recommendations patient was screened for CIR candidacy by Monte Zinni RN MSN. Patient is not yet at a level to tolerate the intensity required to pursue a CIR admit . Patient may have the potential to progress to become a candidate. The CIR admissions team will follow and monitor for progress and place a Rehab Consult order if felt to be appropriate. Please contact me with any questions.  Mal Asher RN MSN Admissions Coordinator [phone]  

## 2021-11-12 NOTE — Evaluation (Signed)
Occupational Therapy Evaluation Patient Details Name: Susan Horton MRN: [id number] DOB: 1970/07/31 Today's Date: 11/12/2021   History of Present Illness 51 y/o female presented to ED on 11/08/21 for progressive fatigue and weakness after fall on 10/27. Admitted for septic shock with unclear origin. Recent L hip tumoral calcinosis surgery a few weeks ago at [facility] with associated drainage from L hip. PMH: ESRD, CAD, HTN, CHF, T2DM with peripheral neuropathy and gastroparesis.   Clinical Impression   Patient admitted for the diagnosis above.  Patient recent hip sx, and staying with her sister during her recovery.  Sister was bathing at bedlevel, and patient was having difficulty with pain control and mobility.  Prior to that, she was living alone and was independent with ADL, iADL and mobility.  Deficits are listed below.  OT will follow in the acute setting with AIR recommended for post acute rehab prior to returning home.  Patient has the ability to reach Mod I level with aggressive rehab.        Recommendations for follow up therapy are one component of a multi-disciplinary discharge planning process, led by the attending physician.  Recommendations may be updated based on patient status, additional functional criteria and insurance authorization.   Follow Up Recommendations  Acute inpatient rehab (3hours/day)    Assistance Recommended at Discharge Frequent or constant Supervision/Assistance  Patient can return home with the following A lot of help with bathing/dressing/bathroom;Assist for transportation;Help with stairs or ramp for entrance    Functional Status Assessment  Patient has had a recent decline in their functional status and demonstrates the ability to make significant improvements in function in a reasonable and predictable amount of time.  Equipment Recommendations  Wheelchair (measurements OT);Wheelchair cushion (measurements OT)    Recommendations for Other Services Rehab  consult     Precautions / Restrictions Precautions Precautions: Fall Precaution Comments: fall, recent hip sx to L hip Restrictions Weight Bearing Restrictions: No      Mobility Bed Mobility Overal bed mobility: Needs Assistance Bed Mobility: Supine to Sit, Sit to Supine     Supine to sit: Max assist, +2 for physical assistance, +2 for safety/equipment Sit to supine: Max assist, +2 for physical assistance, +2 for safety/equipment        Transfers                   General transfer comment: deferred due to myoclonic jerking seated EOB      Balance Overall balance assessment: Needs assistance Sitting-balance support: No upper extremity supported, Feet supported Sitting balance-Leahy Scale: Fair Sitting balance - Comments: full body myoclonic jerking while sitting EOB. Initially min-modA to maintain but progressed to very close min guard.                                   ADL either performed or assessed with clinical judgement   ADL Overall ADL's : Needs assistance/impaired Eating/Feeding: Maximal assistance;Bed level   Grooming: Wash/dry hands;Wash/dry face;Maximal assistance;Bed level   Upper Body Bathing: Maximal assistance;Bed level   Lower Body Bathing: Total assistance;Bed level   Upper Body Dressing : Maximal assistance;Sitting   Lower Body Dressing: Maximal assistance;Bed level   Toilet Transfer: Maximal assistance;BSC/3in1;Squat-pivot   Toileting- Clothing Manipulation and Hygiene: Total assistance;Bed level               Vision   Vision Assessment?: No apparent visual deficits     Perception  Perception Perception: Not tested   Praxis Praxis Praxis: Impaired Praxis Impairment Details: Initiation    Pertinent Vitals/Pain Pain Assessment Pain Assessment: Faces Faces Pain Scale: Hurts little more Pain Location: L hip with movement Pain Descriptors / Indicators: Grimacing, Guarding Pain Intervention(s): Limited  activity within patient's tolerance     Hand Dominance Right   Extremity/Trunk Assessment Upper Extremity Assessment Upper Extremity Assessment: Generalized weakness;LUE deficits/detail;RUE deficits/detail RUE Deficits / Details: jerking movements, with inability to hold arm against gravity.  Not following MMT commands RUE Coordination: decreased fine motor;decreased gross motor LUE Deficits / Details: jerking movements, with inability to hold arm against gravity. LUE Coordination: decreased fine motor;decreased gross motor   Lower Extremity Assessment Lower Extremity Assessment: Defer to PT evaluation   Cervical / Trunk Assessment Cervical / Trunk Assessment: Normal   Communication Communication Communication: Expressive difficulties   Cognition Arousal/Alertness: Awake/alert, Lethargic Behavior During Therapy: Flat affect Overall Cognitive Status: Impaired/Different from baseline Area of Impairment: Orientation, Attention, Memory, Following commands, Safety/judgement, Awareness, Problem solving                 Orientation Level: Disoriented to, Time, Place, Situation Current Attention Level: Focused Memory: Decreased short-term memory Following Commands: Follows one step commands with increased time, Follows one step commands inconsistently Safety/Judgement: Decreased awareness of safety, Decreased awareness of deficits Awareness: Intellectual Problem Solving: Slow processing, Decreased initiation, Difficulty sequencing, Requires verbal cues, Requires tactile cues       General Comments  Mother present and supportive    Exercises     Shoulder Instructions      Home Living Family/patient expects to be discharged to:: Private residence Living Arrangements: Non-relatives/Friends Available Help at Discharge: Family;Available 24 hours/day Type of Home: House Home Access: Level entry     Home Layout: One level     Bathroom Shower/Tub: Animal nutritionist: Standard Bathroom Accessibility: Yes How Accessible: Accessible via walker Home Equipment: Rollator (4 wheels);Cane - single point   Additional Comments: Patient wil be stayig at sister's home initially.  Above information is sister.      Prior Functioning/Environment Prior Level of Function : Needs assist             Mobility Comments: able to ambulate with RW per mother ADLs Comments: sister assists with ADLs since L hip surgery        OT Problem List: Decreased strength;Decreased activity tolerance;Impaired balance (sitting and/or standing);Decreased knowledge of precautions;Decreased knowledge of use of DME or AE;Decreased safety awareness;Decreased cognition;Decreased coordination;Pain      OT Treatment/Interventions: Self-care/ADL training;Therapeutic exercise;Therapeutic activities;Cognitive remediation/compensation;Patient/family education;DME and/or AE instruction;Balance training    OT Goals(Current goals can be found in the care plan section) Acute Rehab OT Goals Patient Stated Goal: None stated OT Goal Formulation: Patient unable to participate in goal setting Time For Goal Achievement: 11/26/21 Potential to Achieve Goals: Good ADL Goals Pt Will Perform Eating: sitting;with min assist Pt Will Perform Grooming: with min assist;sitting Pt Will Perform Upper Body Dressing: with min assist;sitting Pt Will Transfer to Toilet: with mod assist;stand pivot transfer;bedside commode Pt/caregiver will Perform Home Exercise Program: Increased strength;Both right and left upper extremity;With minimal assist  OT Frequency: Min 2X/week    Co-evaluation PT/OT/SLP Co-Evaluation/Treatment: Yes Reason for Co-Treatment: Complexity of the patient's impairments (multi-system involvement)   OT goals addressed during session: ADL's and self-care      AM-PAC OT "6 Clicks" Daily Activity     Outcome Measure Help from another person eating meals?: A  Lot Help from  another person taking care of personal grooming?: A Lot Help from another person toileting, which includes using toliet, bedpan, or urinal?: A Lot Help from another person bathing (including washing, rinsing, drying)?: A Lot Help from another person to put on and taking off regular upper body clothing?: A Lot Help from another person to put on and taking off regular lower body clothing?: A Lot 6 Click Score: 12   End of Session Nurse Communication: Mobility status  Activity Tolerance: Patient tolerated treatment well Patient left: in bed;with call bell/phone within reach;with bed alarm set;with family/visitor present  OT Visit Diagnosis: Unsteadiness on feet (R26.81);Other symptoms and signs involving cognitive function;History of falling (Z91.81);Muscle weakness (generalized) (M62.81)                Time: 6283-1517 OT Time Calculation (min): 23 min Charges:  OT General Charges $OT Visit: 1 Visit OT Evaluation $OT Eval Moderate Complexity: 1 Mod  11/12/2021  RP, OTR/L  Acute Rehabilitation Services  Office:  [phone]   Metta Clines 11/12/2021, 11:26 AM

## 2021-11-12 NOTE — Progress Notes (Signed)
PROGRESS NOTE    Susan Horton  EXB:[id number] DOB: 1970-09-07 DOA: 11/08/2021 PCP: Charlott Rakes, MD    Brief Narrative:  51 year old woman, former smoker, with end-stage renal disease on HD, CAD, hypertension with systolic and diastolic CHF, diabetes complicated by peripheral neuropathy and gastroparesis, lower extremity DVT, asthma.  She underwent recent excision of tumoral calcinosis left hip/thigh, drain and sutures removed on 10/20/2021 without any evidence of infection per office notes.    She was brought to the emergency department 10/29 with 3 days of progressive fatigue, weakness.  She apparently suffered a mechanical fall on 10/27 with resultant R hip pain.  In the ED noted to be febrile, lethargic/confused, tachycardic.    Assessment and Plan:  Acute encephalopathy, suspect metabolic Myoclonus -? Medication related -appears slightly improved with HD yesterday-- able to say her name    Severe sepsis -improved shock.  - concerning for possible bacteremia in patient with vascular access, on HD.  - no cultures done -CT Hip with some soft tissue edema which might reflect mild cellulitis. Wound draining-- culture done  -Procal cont to rise  -changing cefepime to zosyn.     Biventricular heart failure Severe Tricuspid regurg  Pulmonary hypertension CAD s/p mLAD PCI w DES (2018) PVD  HLD HTN -in 2021 had normal LVEF and g2dd, mild TR. This admission LVEF 45-50, LV global hypokinesis. RV moderately reduced systolic fxn, moderately enlarged RV size, mildly elevated PASP. RVSP 43    P -DAPT  -volume management per RRT    Acute on chronic renal failure, ESRD on HD.  Only had a partial HD run on 10/27.   Hyperkalemia Hyperphosphatemia  Uremia -HD yesterday and need again today  Anemia  -trend -transfuse for <7   Asthma without exacerbation  -breo ellipta, PRN duoneb, singulair   DM2/Diabetic neuropathy /Gastroparesis  -SSI -gabapentin- d/c'd   Tumoral  calcinosis, recent left thigh/hip surgery -Wound care  -culture the purulent discharge    DVT prophylaxis: heparin injection 5,000 Units Start: 11/11/21 1400    Code Status: Full Code Family Communication:   Disposition Plan:  Level of care: Progressive Status is: Inpatient Remains inpatient appropriate because: AMS    Consultants:  PCCM renal   Subjective: Able to say her name  Objective: Vitals:   11/11/21 1728 11/11/21 1837 11/11/21 2119 11/12/21 0839  BP: (!) 105/50 (!) 96/45 (!) 125/57 (!) 138/51  Pulse: (!) 113 96 95 93  Resp: '13 17 18 16  '$ Temp:  99.5 F (37.5 C) 98.3 F (36.8 C) 98.6 F (37 C)  TempSrc:  Oral Oral Oral  SpO2: 97% 98% 96% 100%  Weight:        Intake/Output Summary (Last 24 hours) at 11/12/2021 1212 Last data filed at 11/12/2021 1100 Gross per 24 hour  Intake 100 ml  Output 0.1 ml  Net 99.9 ml   Filed Weights   11/11/21 0500 11/11/21 0501  Weight: 68.9 kg 68.9 kg    Examination:   General: Appearance:     Overweight female who appears sick     Lungs:     Clear to auscultation bilaterally, respirations unlabored  Heart:    Normal heart rate. Normal rhythm. No murmurs, rubs, or gallops.    MS:   All extremities are intact.    Neurologic:   Awake, alert, slow to respond,        Data Reviewed: I have personally reviewed following labs and imaging studies  CBC: Recent Labs  Lab 11/09/21 0310 11/09/21  0403 11/09/21 1030 11/10/21 0408 11/11/21 0256 11/12/21 0512  WBC 9.6 11.4*  --  13.0* 13.0* 13.0*  HGB 5.3* 6.6* 7.7* 7.4* 7.6* 7.5*  HCT 18.3* 23.1* 25.9* 25.4* 25.9* 25.4*  MCV 79.2* 79.4*  --  80.9 80.4 80.4  PLT 237 278  --  312 327 258   Basic Metabolic Panel: Recent Labs  Lab 11/08/21 1635 11/09/21 0310 11/10/21 0924 11/11/21 0256 11/12/21 0512  NA 136 137  135 136 137  136 134*  K 6.1* 3.9  3.8 4.5 5.2*  5.2* 4.2  CL 100 106  105 103 103  102 94*  CO2 16* 17*  17* 21* 21*  21* 23  GLUCOSE 117*  72  71 96 137*  137* 84  BUN 113* 47*  49* 57* 67*  72* 35*  CREATININE 10.66* 4.18*  4.26* 6.22* 7.62*  7.54* 4.83*  CALCIUM 6.7* 7.2*  7.2* 7.7* 8.0*  7.9* 8.4*  MG  --  1.7  --  2.5*  --   PHOS 9.9* 4.5  4.7*  --  9.1*  9.0* 6.4*   GFR: Estimated Creatinine Clearance: 13.4 mL/min (A) (by C-G formula based on SCr of 4.83 mg/dL (H)). Liver Function Tests: Recent Labs  Lab 11/08/21 0929 11/08/21 1635 11/09/21 0310 11/11/21 0256 11/12/21 0512  AST 21  --  39  --   --   ALT 9  --  17  --   --   ALKPHOS 171*  --  160*  --   --   BILITOT 0.6  --  0.6  --   --   PROT 7.2  --  4.6*  --   --   ALBUMIN 2.4* 2.5* 1.6*  1.6* 2.2* 1.9*   No results for input(s): "LIPASE", "AMYLASE" in the last 168 hours. Recent Labs  Lab 11/11/21 1041  AMMONIA 30   Coagulation Profile: Recent Labs  Lab 11/08/21 1250 11/09/21 0422  INR 1.2 1.3*   Cardiac Enzymes: Recent Labs  Lab 11/11/21 1041  CKTOTAL 38   BNP (last 3 results) No results for input(s): "PROBNP" in the last 8760 hours. HbA1C: Recent Labs    11/11/21 0256  HGBA1C 5.0   CBG: Recent Labs  Lab 11/11/21 1143 11/11/21 1836 11/11/21 2114 11/12/21 0741 11/12/21 1111  GLUCAP 104* 74 96 77 77   Lipid Profile: No results for input(s): "CHOL", "HDL", "LDLCALC", "TRIG", "CHOLHDL", "LDLDIRECT" in the last 72 hours. Thyroid Function Tests: No results for input(s): "TSH", "T4TOTAL", "FREET4", "T3FREE", "THYROIDAB" in the last 72 hours. Anemia Panel: Recent Labs    11/09/21 1230  VITAMINB12 498  FOLATE 8.9  FERRITIN 5,455*  TIBC 127*  IRON 22*  RETICCTPCT 1.1   Sepsis Labs: Recent Labs  Lab 11/08/21 1247 11/08/21 1633 11/08/21 1634 11/08/21 1911 11/08/21 2355 11/09/21 0310 11/10/21 0408 11/11/21 0824 11/12/21 0512  PROCALCITON  --    < >  --   --   --  11.26 27.44 25.41 21.71  LATICACIDVEN 2.0*  --  1.5 2.5* 1.3  --   --   --   --    < > = values in this interval not displayed.    Recent  Results (from the past 240 hour(s))  SARS Coronavirus 2 by RT PCR (hospital order, performed in [facility] hospital lab) *cepheid single result test* Anterior Nasal Swab     Status: None   Collection Time: 11/08/21  9:04 AM   Specimen: Anterior Nasal Swab  Result Value  Ref Range Status   SARS Coronavirus 2 by RT PCR NEGATIVE NEGATIVE Final    Comment: (NOTE) SARS-CoV-2 target nucleic acids are NOT DETECTED.  The SARS-CoV-2 RNA is generally detectable in upper and lower respiratory specimens during the acute phase of infection. The lowest concentration of SARS-CoV-2 viral copies this assay can detect is 250 copies / mL. A negative result does not preclude SARS-CoV-2 infection and should not be used as the sole basis for treatment or other patient management decisions.  A negative result may occur with improper specimen collection / handling, submission of specimen other than nasopharyngeal swab, presence of viral mutation(s) within the areas targeted by this assay, and inadequate number of viral copies (<250 copies / mL). A negative result must be combined with clinical observations, patient history, and epidemiological information.  Fact Sheet for Patients:   https://www.patel.info/  Fact Sheet for Healthcare Providers: https://hall.com/  This test is not yet approved or  cleared by the [place] FDA and has been authorized for detection and/or diagnosis of SARS-CoV-2 by FDA under an Emergency Use Authorization (EUA).  This EUA will remain in effect (meaning this test can be used) for the duration of the COVID-19 declaration under Section 564(b)(1) of the Act, 21 U.S.C. section 360bbb-3(b)(1), unless the authorization is terminated or revoked sooner.  Performed at [facility], [place] [address], [place], [place] [address]   MRSA Next Gen by PCR, Nasal     Status: None   Collection Time: 11/08/21  3:14 PM   Specimen: Nasal  Mucosa; Nasal Swab  Result Value Ref Range Status   MRSA by PCR Next Gen NOT DETECTED NOT DETECTED Final    Comment: (NOTE) The GeneXpert MRSA Assay (FDA approved for NASAL specimens only), is one component of a comprehensive MRSA colonization surveillance program. It is not intended to diagnose MRSA infection nor to guide or monitor treatment for MRSA infections. Test performance is not FDA approved in patients less than 74 years old. Performed at [facility], [place] [address] [place], [place] [address]          Radiology Studies: No results found.      Scheduled Meds:  aspirin  81 mg Oral Daily   calcitRIOL  1.5 mcg Oral Daily   calcium acetate  667 mg Oral TID WC   Chlorhexidine Gluconate Cloth  6 each Topical Daily   clopidogrel  75 mg Oral Q breakfast   darbepoetin (ARANESP) injection - NON-DIALYSIS  200 mcg Subcutaneous Q Mon-1800   fluticasone furoate-vilanterol  1 puff Inhalation Daily   heparin injection (subcutaneous)  5,000 Units Subcutaneous Q8H   insulin aspart  0-5 Units Subcutaneous QHS   insulin aspart  0-6 Units Subcutaneous TID WC   montelukast  10 mg Oral QHS   pantoprazole (PROTONIX) IV  40 mg Intravenous QHS   Continuous Infusions:  piperacillin-tazobactam (ZOSYN)  IV 2.25 g (11/12/21 0644)     LOS: 4 days    Time spent: 45 minutes spent on chart review, discussion with nursing staff, consultants, updating family and interview/physical exam; more than 50% of that time was spent in counseling and/or coordination of care.    Geradine Girt, DO [facility] Available via Epic secure chat 7am-7pm After these hours, please refer to coverage provider listed on [email or web address] 11/12/2021, 12:12 PM

## 2021-11-12 NOTE — Progress Notes (Signed)
Large amount of purulent drainage noted from left hip wound, Dr. Eliseo Squires in to see pt. And made aware, wound culture sent  Anastasio Auerbach

## 2021-11-12 NOTE — Progress Notes (Signed)
Pt admitted to 6E AxOx1, VS as per flow. Pt grunting but not really communicating. Grand-daughter bedside and according to her, she was having a conversation earlier and was oriented. Pt familiar with the [facility] system. All questions and concerns addressed. Call bell placed within reach, will continue to monitor and maintain safety.

## 2021-11-12 NOTE — Care Management Important Message (Signed)
Important Message  Patient Details  Name: Susan Horton MRN: [id number] Date of Birth: [birth year]   Medicare Important Message Given:  Yes     Gabrielle Wakeland 11/12/2021, 2:19 PM

## 2021-11-12 NOTE — Progress Notes (Addendum)
[facility] Progress Note   Subjective: Seen in room, lying in bed. Mother is at bedside. Still have myoclonus, not talking very much. Denies pain/discomfort. HD tomorrow on schedule.     Objective Vitals:   11/11/21 1728 11/11/21 1837 11/11/21 2119 11/12/21 0839  BP: (!) 105/50 (!) 96/45 (!) 125/57 (!) 138/51  Pulse: (!) 113 96 95 93  Resp: '13 17 18 16  '$ Temp:  99.5 F (37.5 C) 98.3 F (36.8 C) 98.6 F (37 C)  TempSrc:  Oral Oral Oral  SpO2: 97% 98% 96% 100%  Weight:       Physical Exam General: chronically ill appearing female in NAD Neuro: Myoclonus present. Oriented to person & place but isn't talking very much.  Heart: S1,S2 RRR no M/R/G. SR on monitor. Lungs: CTAB Abdomen:NABS, NT Extremities: No LE edema. Wound L hip, edema around wound.  Dialysis Access: RIJ Temp cath in place. L AVF +T/B   Additional Objective Labs: Basic Metabolic Panel: Recent Labs  Lab 11/09/21 0310 11/10/21 0924 11/11/21 0256 11/12/21 0512  NA 137  135 136 137  136 134*  K 3.9  3.8 4.5 5.2*  5.2* 4.2  CL 106  105 103 103  102 94*  CO2 17*  17* 21* 21*  21* 23  GLUCOSE 72  71 96 137*  137* 84  BUN 47*  49* 57* 67*  72* 35*  CREATININE 4.18*  4.26* 6.22* 7.62*  7.54* 4.83*  CALCIUM 7.2*  7.2* 7.7* 8.0*  7.9* 8.4*  PHOS 4.5  4.7*  --  9.1*  9.0* 6.4*   Liver Function Tests: Recent Labs  Lab 11/08/21 0929 11/08/21 1635 11/09/21 0310 11/11/21 0256 11/12/21 0512  AST 21  --  39  --   --   ALT 9  --  17  --   --   ALKPHOS 171*  --  160*  --   --   BILITOT 0.6  --  0.6  --   --   PROT 7.2  --  4.6*  --   --   ALBUMIN 2.4*   < > 1.6*  1.6* 2.2* 1.9*   < > = values in this interval not displayed.   No results for input(s): "LIPASE", "AMYLASE" in the last 168 hours. CBC: Recent Labs  Lab 11/09/21 0310 11/09/21 0403 11/09/21 1030 11/10/21 0408 11/11/21 0256 11/12/21 0512  WBC 9.6 11.4*  --  13.0* 13.0* 13.0*  HGB 5.3* 6.6*   < > 7.4* 7.6*  7.5*  HCT 18.3* 23.1*   < > 25.4* 25.9* 25.4*  MCV 79.2* 79.4*  --  80.9 80.4 80.4  PLT 237 278  --  312 327 376   < > = values in this interval not displayed.   Blood Culture    Component Value Date/Time   SDES FLUID PERITONEAL 03/01/2019 0735   SDES FLUID PERITONEAL 03/01/2019 0735   SPECREQUEST BOTTLES DRAWN AEROBIC AND ANAEROBIC 03/01/2019 0735   SPECREQUEST NONE 03/01/2019 0735   CULT  03/01/2019 0735    NO GROWTH 5 DAYS Performed at [facility], [place] [address] [place], [place] [address]    REPTSTATUS 03/06/2019 FINAL 03/01/2019 0735   REPTSTATUS 03/02/2019 FINAL 03/01/2019 0735    Cardiac Enzymes: Recent Labs  Lab 11/11/21 1041  CKTOTAL 38   CBG: Recent Labs  Lab 11/11/21 1143 11/11/21 1836 11/11/21 2114 11/12/21 0741 11/12/21 1111  GLUCAP 104* 74 96 77 77   Iron Studies:  Recent Labs  11/09/21 1230  IRON 22*  TIBC 127*  FERRITIN 5,455*   '@lablastinr3'$ @ Studies/Results: No results found. Medications:  piperacillin-tazobactam (ZOSYN)  IV 2.25 g (11/12/21 0644)    aspirin  81 mg Oral Daily   calcitRIOL  1.5 mcg Oral Daily   calcium acetate  667 mg Oral TID WC   Chlorhexidine Gluconate Cloth  6 each Topical Daily   clopidogrel  75 mg Oral Q breakfast   darbepoetin (ARANESP) injection - NON-DIALYSIS  200 mcg Subcutaneous Q Mon-1800   fluticasone furoate-vilanterol  1 puff Inhalation Daily   heparin injection (subcutaneous)  5,000 Units Subcutaneous Q8H   insulin aspart  0-5 Units Subcutaneous QHS   insulin aspart  0-6 Units Subcutaneous TID WC   montelukast  10 mg Oral QHS   pantoprazole (PROTONIX) IV  40 mg Intravenous QHS     OP HD: [facility]WF [facility] 3h 39mn 400/600  61kg  2/2 bath P2  Hep 4000  LUE AVF - stays on the machine, missed 1 HD in past 3 wks - mircera 200 mcg IV q2, last 10/16, due 10/30 - venofer '100mg'$  IV tiw thru 11/06 - doxercalciferol 7 ug tiw - sod thiosulfate 25 gm iv tiw   Assessment/ Plan: Septic shock: high fevers,  cx's pending. Looks much better. Getting IV abx w/ cefepime/ flagyl/ vanc IV. Off of pressors.  Hyperkalemia: resolved w/ CRRT ESRD on HD: CRRT started on 10/29 due to AMS, high BUN, etc. Doing much better so DC'd CRRT Monday. Resumed IHD 11/12/2021. Next HD 11/13/2021. Time decreased to 3.5 hours D/T scheduling. Will keep temp cath in until she is more stable.  Encephalopathy: shock and uremia. Pt is Ox 3 now. Ammonia is normal.  Myoclonus: combination of neurotoxicity from gabapentin, oxycodone and IV cefepime, all now dc'd. Hopefully will improve w/ HD BMM: cont outpt fosrenol  when eating Anemia of esrd: Hb 8.8 was 10 on 10/23, on mircera 200 q2 weeks, last dose 10/16, due today, will order 200 ug sq weekly. Holding IV iron with sepsis.  Hypotension/Volume: does not appear to have excess volume on board. Ran even in HD yesterday. UF as tolerated. Keep SBP > 100 if possible. May need midodrine.  CAD/ PAD DM2   Rita H. Brown NP-C 11/12/2021, 11:47 AM  [facility] [phone] Pt seen, examined and agree w A/P as above.  RKelly Splinter MD 11/12/2021, 12:42 PM

## 2021-11-12 NOTE — Progress Notes (Signed)
(  L) hip dressing soiled and pads replaced.

## 2021-11-12 NOTE — Progress Notes (Signed)
Report called to 6 th floor, pt. Transferred to 6 th floor via bed with all belongings in stable condition  Anastasio Auerbach

## 2021-11-13 ENCOUNTER — Inpatient Hospital Stay (HOSPITAL_COMMUNITY): Payer: Medicare Other

## 2021-11-13 DIAGNOSIS — A419 Sepsis, unspecified organism: Secondary | ICD-10-CM | POA: Diagnosis not present

## 2021-11-13 DIAGNOSIS — R6520 Severe sepsis without septic shock: Secondary | ICD-10-CM | POA: Diagnosis not present

## 2021-11-13 DIAGNOSIS — I071 Rheumatic tricuspid insufficiency: Secondary | ICD-10-CM

## 2021-11-13 DIAGNOSIS — G9341 Metabolic encephalopathy: Secondary | ICD-10-CM

## 2021-11-13 DIAGNOSIS — I5041 Acute combined systolic (congestive) and diastolic (congestive) heart failure: Secondary | ICD-10-CM

## 2021-11-13 DIAGNOSIS — E875 Hyperkalemia: Secondary | ICD-10-CM | POA: Diagnosis not present

## 2021-11-13 LAB — BPAM RBC
Blood Product Expiration Date: 202311082359
Blood Product Expiration Date: 202311262359
ISSUE DATE / TIME: 202310300603
Unit Type and Rh: 5100
Unit Type and Rh: 9500

## 2021-11-13 LAB — GLUCOSE, CAPILLARY
Glucose-Capillary: 84 mg/dL (ref 70–99)
Glucose-Capillary: 70 mg/dL (ref 70–99)
Glucose-Capillary: 81 mg/dL (ref 70–99)

## 2021-11-13 LAB — RENAL FUNCTION PANEL
Albumin: 1.8 g/dL — ABNORMAL LOW (ref 3.5–5.0)
Anion gap: 16 — ABNORMAL HIGH (ref 5–15)
BUN: 48 mg/dL — ABNORMAL HIGH (ref 6–20)
CO2: 25 mmol/L (ref 22–32)
Calcium: 8.4 mg/dL — ABNORMAL LOW (ref 8.9–10.3)
Chloride: 94 mmol/L — ABNORMAL LOW (ref 98–111)
Creatinine, Ser: 6.05 mg/dL — ABNORMAL HIGH (ref 0.44–1.00)
GFR, Estimated: 8 mL/min — ABNORMAL LOW (ref >60)
Glucose, Bld: 94 mg/dL (ref 70–99)
Phosphorus: 7 mg/dL — ABNORMAL HIGH (ref 2.5–4.6)
Potassium: 4.3 mmol/L (ref 3.5–5.1)
Sodium: 135 mmol/L (ref 135–145)

## 2021-11-13 LAB — TYPE AND SCREEN
ABO/RH(D): O POS
Antibody Screen: NEGATIVE
Unit division: 0
Unit division: 0

## 2021-11-13 LAB — URIC ACID: Uric Acid, Serum: 4.9 mg/dL (ref 2.5–7.1)

## 2021-11-13 LAB — PROCALCITONIN: Procalcitonin: 18.01 ng/mL

## 2021-11-13 MED ORDER — METOPROLOL SUCCINATE ER 25 MG PO TB24
25.0000 mg | ORAL_TABLET | Freq: Every day | ORAL | Status: DC
  Administered 2021-11-13 – 2021-11-22 (×8): 25 mg via ORAL
  Filled 2021-11-13 (×9): qty 1

## 2021-11-13 MED ORDER — DOXERCALCIFEROL 4 MCG/2ML IV SOLN
7.0000 ug | INTRAVENOUS | Status: DC
  Administered 2021-11-13 – 2021-11-20 (×3): 7 ug via INTRAVENOUS
  Filled 2021-11-13 (×8): qty 4

## 2021-11-13 MED ORDER — POLYETHYLENE GLYCOL 3350 17 G PO PACK: 17.0000 g | PACK | Freq: Every day | ORAL | Status: DC | PRN

## 2021-11-13 MED ORDER — LIDOCAINE 5 % EX PTCH
1.0000 | MEDICATED_PATCH | CUTANEOUS | Status: DC
  Administered 2021-11-13 – 2021-11-23 (×9): 1 via TRANSDERMAL
  Filled 2021-11-13 (×11): qty 1

## 2021-11-13 MED ORDER — PROSOURCE PLUS PO LIQD
30.0000 mL | Freq: Two times a day (BID) | ORAL | Status: DC
  Administered 2021-11-13 – 2021-11-17 (×3): 30 mL via ORAL
  Filled 2021-11-13 (×7): qty 30

## 2021-11-13 MED ORDER — LIDOCAINE HCL (PF) 1 % IJ SOLN: 5.0000 mL | INTRAMUSCULAR | Status: DC | PRN

## 2021-11-13 MED ORDER — HEPARIN SODIUM (PORCINE) 1000 UNIT/ML DIALYSIS
4000.0000 [IU] | Freq: Once | INTRAMUSCULAR | Status: DC
  Filled 2021-11-13: qty 4

## 2021-11-13 MED ORDER — LIDOCAINE-PRILOCAINE 2.5-2.5 % EX CREA: 1.0000 | TOPICAL_CREAM | CUTANEOUS | Status: DC | PRN

## 2021-11-13 MED ORDER — OXYCODONE HCL 5 MG PO TABS
5.0000 mg | ORAL_TABLET | Freq: Four times a day (QID) | ORAL | Status: AC | PRN
  Administered 2021-11-13: 5 mg via ORAL
  Filled 2021-11-13: qty 1

## 2021-11-13 MED ORDER — HYDROMORPHONE HCL 1 MG/ML IJ SOLN
0.5000 mg | INTRAMUSCULAR | Status: AC | PRN
  Administered 2021-11-13: 0.5 mg via INTRAVENOUS
  Filled 2021-11-13: qty 1

## 2021-11-13 MED ORDER — PENTAFLUOROPROP-TETRAFLUOROETH EX AERO: 1.0000 | INHALATION_SPRAY | CUTANEOUS | Status: DC | PRN

## 2021-11-13 MED ORDER — METOPROLOL TARTRATE 5 MG/5ML IV SOLN
5.0000 mg | Freq: Once | INTRAVENOUS | Status: AC
  Administered 2021-11-13: 5 mg via INTRAVENOUS
  Filled 2021-11-13: qty 5

## 2021-11-13 MED ORDER — LANTHANUM CARBONATE 500 MG PO CHEW
1000.0000 mg | CHEWABLE_TABLET | Freq: Three times a day (TID) | ORAL | Status: DC
  Administered 2021-11-13 – 2021-11-17 (×2): 1000 mg via ORAL
  Filled 2021-11-13 (×9): qty 2

## 2021-11-13 NOTE — Progress Notes (Signed)
PT cancellation: Attempted to see pt for PT session but pt noted to be OTF at dialysis at this time. Will f/u as able.  Lavone Nian, PT, DPT 11/13/21, 12:24 PM

## 2021-11-13 NOTE — Progress Notes (Signed)
PROGRESS NOTE    Susan Horton  SWH:[id number] DOB: 1970-09-26 DOA: 11/08/2021 PCP: Charlott Rakes, MD    Brief Narrative:  51 year old woman, former smoker, with end-stage renal disease on HD, CAD, hypertension with systolic and diastolic CHF, diabetes complicated by peripheral neuropathy and gastroparesis, lower extremity DVT, asthma.  She underwent recent excision of tumoral calcinosis left hip/thigh, drain and sutures removed on 10/20/2021 without any evidence of infection per office notes.    She was brought to the emergency department 10/29 with 3 days of progressive fatigue, weakness.  She apparently suffered a mechanical fall on 10/27 with resultant R hip pain.  In the ED noted to be febrile, lethargic/confused, tachycardic.  Slowly improving   Assessment and Plan:  Acute encephalopathy, suspect metabolic Myoclonus -? Medication related -mental status continues to improve  Left foot pain -uric acid normal -check x ray  -add lidocaine patch   Severe sepsis -improved shock.  - concerning for possible bacteremia in patient with vascular access, on HD.  - no cultures done -CT Hip with some soft tissue edema which might reflect mild cellulitis. Wound draining-- culture done - NGTD -changing cefepime to zosyn.     Biventricular heart failure Severe Tricuspid regurg - new Pulmonary hypertension CAD s/p mLAD PCI w DES (2018) PVD  HLD HTN -in 2021 had normal LVEF and g2dd, mild TR. This admission LVEF 45-50, LV global hypokinesis. RV moderately reduced systolic fxn, moderately enlarged RV size, mildly elevated PASP. RVSP 43    P -DAPT  -volume management per RRT  -cards consult   Acute on chronic renal failure, ESRD on HD.  Only had a partial HD run on 10/27.   Hyperkalemia Hyperphosphatemia  Uremia -HD 11/3  Anemia  -trend -transfuse for <7   Asthma without exacerbation  -breo ellipta, PRN duoneb, singulair   DM2/Diabetic neuropathy /Gastroparesis   -SSI -gabapentin- d/c'd   Tumoral calcinosis, recent left thigh/hip surgery -Wound care  -culture the purulent discharge -on abx    DVT prophylaxis: heparin injection 5,000 Units Start: 11/11/21 1400    Code Status: Full Code Family Communication: at bedside  Disposition Plan:  Level of care: Progressive Status is: Inpatient Remains inpatient appropriate because: AMS    Consultants:  PCCM renal   Subjective: Able to carry on conversation  Objective: Vitals:   11/13/21 1130 11/13/21 1200 11/13/21 1230 11/13/21 1300  BP: (!) 163/63 (!) 166/87 (!) 154/71   Pulse:      Resp: '19 19 19   '$ Temp:      TempSrc:      SpO2:  99% 99% 99%  Weight:       No intake or output data in the 24 hours ending 11/13/21 1355  Filed Weights   11/11/21 0500 11/11/21 0501 11/13/21 0616  Weight: 68.9 kg 68.9 kg 68.6 kg    Examination:   General: Appearance:     Overweight female in no acute distress     Lungs:     respirations unlabored  Heart:    Normal heart rate.  MS:   All extremities are intact.   Neurologic:   Awake, alert       Data Reviewed: I have personally reviewed following labs and imaging studies  CBC: Recent Labs  Lab 11/09/21 0310 11/09/21 0403 11/09/21 1030 11/10/21 0408 11/11/21 0256 11/12/21 0512  WBC 9.6 11.4*  --  13.0* 13.0* 13.0*  HGB 5.3* 6.6* 7.7* 7.4* 7.6* 7.5*  HCT 18.3* 23.1* 25.9* 25.4* 25.9* 25.4*  MCV  79.2* 79.4*  --  80.9 80.4 80.4  PLT 237 278  --  312 327 338   Basic Metabolic Panel: Recent Labs  Lab 11/08/21 1635 11/09/21 0310 11/10/21 0924 11/11/21 0256 11/12/21 0512 11/13/21 0148  NA 136 137  135 136 137  136 134* 135  K 6.1* 3.9  3.8 4.5 5.2*  5.2* 4.2 4.3  CL 100 106  105 103 103  102 94* 94*  CO2 16* 17*  17* 21* 21*  21* 23 25  GLUCOSE 117* 72  71 96 137*  137* 84 94  BUN 113* 47*  49* 57* 67*  72* 35* 48*  CREATININE 10.66* 4.18*  4.26* 6.22* 7.62*  7.54* 4.83* 6.05*  CALCIUM 6.7* 7.2*  7.2*  7.7* 8.0*  7.9* 8.4* 8.4*  MG  --  1.7  --  2.5*  --   --   PHOS 9.9* 4.5  4.7*  --  9.1*  9.0* 6.4* 7.0*   GFR: Estimated Creatinine Clearance: 10.7 mL/min (A) (by C-G formula based on SCr of 6.05 mg/dL (H)). Liver Function Tests: Recent Labs  Lab 11/08/21 0929 11/08/21 1635 11/09/21 0310 11/11/21 0256 11/12/21 0512 11/13/21 0148  AST 21  --  39  --   --   --   ALT 9  --  17  --   --   --   ALKPHOS 171*  --  160*  --   --   --   BILITOT 0.6  --  0.6  --   --   --   PROT 7.2  --  4.6*  --   --   --   ALBUMIN 2.4* 2.5* 1.6*  1.6* 2.2* 1.9* 1.8*   No results for input(s): "LIPASE", "AMYLASE" in the last 168 hours. Recent Labs  Lab 11/11/21 1041  AMMONIA 30   Coagulation Profile: Recent Labs  Lab 11/08/21 1250 11/09/21 0422  INR 1.2 1.3*   Cardiac Enzymes: Recent Labs  Lab 11/11/21 1041  CKTOTAL 38   BNP (last 3 results) No results for input(s): "PROBNP" in the last 8760 hours. HbA1C: Recent Labs    11/11/21 0256  HGBA1C 5.0   CBG: Recent Labs  Lab 11/12/21 0741 11/12/21 1111 11/12/21 1627 11/12/21 2207 11/13/21 0902  GLUCAP 77 77 103* 114* 84   Lipid Profile: No results for input(s): "CHOL", "HDL", "LDLCALC", "TRIG", "CHOLHDL", "LDLDIRECT" in the last 72 hours. Thyroid Function Tests: No results for input(s): "TSH", "T4TOTAL", "FREET4", "T3FREE", "THYROIDAB" in the last 72 hours. Anemia Panel: No results for input(s): "VITAMINB12", "FOLATE", "FERRITIN", "TIBC", "IRON", "RETICCTPCT" in the last 72 hours.  Sepsis Labs: Recent Labs  Lab 11/08/21 1247 11/08/21 1633 11/08/21 1634 11/08/21 1911 11/08/21 2355 11/09/21 0310 11/10/21 0408 11/11/21 0824 11/12/21 0512 11/13/21 0148  PROCALCITON  --    < >  --   --   --    < > 27.44 25.41 21.71 18.01  LATICACIDVEN 2.0*  --  1.5 2.5* 1.3  --   --   --   --   --    < > = values in this interval not displayed.    Recent Results (from the past 240 hour(s))  SARS Coronavirus 2 by RT PCR (hospital  order, performed in [facility] hospital lab) *cepheid single result test* Anterior Nasal Swab     Status: None   Collection Time: 11/08/21  9:04 AM   Specimen: Anterior Nasal Swab  Result Value Ref Range Status   SARS Coronavirus  2 by RT PCR NEGATIVE NEGATIVE Final    Comment: (NOTE) SARS-CoV-2 target nucleic acids are NOT DETECTED.  The SARS-CoV-2 RNA is generally detectable in upper and lower respiratory specimens during the acute phase of infection. The lowest concentration of SARS-CoV-2 viral copies this assay can detect is 250 copies / mL. A negative result does not preclude SARS-CoV-2 infection and should not be used as the sole basis for treatment or other patient management decisions.  A negative result may occur with improper specimen collection / handling, submission of specimen other than nasopharyngeal swab, presence of viral mutation(s) within the areas targeted by this assay, and inadequate number of viral copies (<250 copies / mL). A negative result must be combined with clinical observations, patient history, and epidemiological information.  Fact Sheet for Patients:   https://www.patel.info/  Fact Sheet for Healthcare Providers: https://hall.com/  This test is not yet approved or  cleared by the [place] FDA and has been authorized for detection and/or diagnosis of SARS-CoV-2 by FDA under an Emergency Use Authorization (EUA).  This EUA will remain in effect (meaning this test can be used) for the duration of the COVID-19 declaration under Section 564(b)(1) of the Act, 21 U.S.C. section 360bbb-3(b)(1), unless the authorization is terminated or revoked sooner.  Performed at [facility], [place] [address], [place], [place] [address]   MRSA Next Gen by PCR, Nasal     Status: None   Collection Time: 11/08/21  3:14 PM   Specimen: Nasal Mucosa; Nasal Swab  Result Value Ref Range Status   MRSA by PCR Next Gen NOT  DETECTED NOT DETECTED Final    Comment: (NOTE) The GeneXpert MRSA Assay (FDA approved for NASAL specimens only), is one component of a comprehensive MRSA colonization surveillance program. It is not intended to diagnose MRSA infection nor to guide or monitor treatment for MRSA infections. Test performance is not FDA approved in patients less than 34 years old. Performed at [facility], [place] [address], [place], [place] [address]   Culture, blood (Routine X 2) w Reflex to ID Panel     Status: None (Preliminary result)   Collection Time: 11/12/21 11:47 AM   Specimen: BLOOD RIGHT WRIST  Result Value Ref Range Status   Specimen Description BLOOD RIGHT WRIST  Final   Special Requests   Final    BOTTLES DRAWN AEROBIC AND ANAEROBIC Blood Culture adequate volume   Culture   Final    NO GROWTH < 24 HOURS Performed at [facility], [place] [address] [place], [place] [address]    Report Status PENDING  Incomplete  Culture, blood (Routine X 2) w Reflex to ID Panel     Status: None (Preliminary result)   Collection Time: 11/12/21 12:00 PM   Specimen: BLOOD RIGHT WRIST  Result Value Ref Range Status   Specimen Description BLOOD RIGHT WRIST  Final   Special Requests   Final    BOTTLES DRAWN AEROBIC AND ANAEROBIC Blood Culture adequate volume   Culture   Final    NO GROWTH < 24 HOURS Performed at [facility], [place] [address], [place], [place] [address]    Report Status PENDING  Incomplete  Aerobic Culture w Gram Stain (superficial specimen)     Status: None (Preliminary result)   Collection Time: 11/12/21  2:25 PM   Specimen: Wound  Result Value Ref Range Status   Specimen Description WOUND HIP  Final   Special Requests  LEFT  Final   Gram Stain NO WBC SEEN NO ORGANISMS SEEN   Final   Culture   Final    NO GROWTH < 24 HOURS Performed at [facility], [place] [address], [place], [place] [address]    Report Status PENDING  Incomplete          Radiology Studies: No results found.      Scheduled Meds:  (feeding supplement) PROSource Plus  30 mL Oral BID BM   aspirin  81 mg Oral Daily   Chlorhexidine Gluconate Cloth  6 each Topical Daily   clopidogrel  75 mg Oral Q breakfast   darbepoetin (ARANESP) injection - NON-DIALYSIS  200 mcg Subcutaneous Q Mon-1800   doxercalciferol  7 mcg Intravenous Q M,W,F-HD   fluticasone furoate-vilanterol  1 puff Inhalation Daily   heparin  4,000 Units Dialysis Once in dialysis   heparin injection (subcutaneous)  5,000 Units Subcutaneous Q8H   insulin aspart  0-5 Units Subcutaneous QHS   insulin aspart  0-6 Units Subcutaneous TID WC   lanthanum  1,000 mg Oral TID WC   lidocaine  1 patch Transdermal Q24H   metoprolol succinate  25 mg Oral Daily   montelukast  10 mg Oral QHS   pantoprazole (PROTONIX) IV  40 mg Intravenous QHS   thiamine (VITAMIN B1) injection  100 mg Intravenous Daily   Continuous Infusions:  piperacillin-tazobactam (ZOSYN)  IV 2.25 g (11/13/21 0618)     LOS: 5 days    Time spent: 45 minutes spent on chart review, discussion with nursing staff, consultants, updating family and interview/physical exam; more than 50% of that time was spent in counseling and/or coordination of care.    Geradine Girt, DO [facility] Available via Epic secure chat 7am-7pm After these hours, please refer to coverage provider listed on [email or web address] 11/13/2021, 1:55 PM

## 2021-11-13 NOTE — Consult Note (Addendum)
The patient has been seen in conjunction with Susan Seal, MD. All aspects of care have been considered and discussed. The patient has been personally interviewed, examined, and all clinical data has been reviewed.  The severe tricuspid regurgitation is related to moderately elevated pulmonary artery pressure  present at time of study due to acute illness/sepsis possible toxen related reduction in RV and LV function. Tricuspid regurgitation has been previously documented in 06/2019 as moderate. Unless there is concern about tricuspid vegetation where a TEE might be helpful to diagnose, no further w/u is needed. Hopefully LV, RV, and PA pressures have improved now that sepsis has resolved. Recommend close clinical f/u but no further testing at this time.   Cardiology Consultation   Patient ID: Susan Horton MRN: [id number]; DOB: March 05, 1970  Admit date: 11/08/2021 Date of Consult: 11/13/2021  PCP:  Charlott Rakes, [place] Providers Cardiologist:  Quay Burow, MD        Patient Profile:   Susan Horton is a 51 y.o. female with a hx of CAD s/p PCI with DES to mLAD in 2018, ESRD on HD, diastolic CHF, who is being seen 11/13/2021 for the evaluation of severe tricuspid regurgitation at the request of Eulogio Bear DO.  History of Present Illness:   Ms. Stong presented to ED on 11/08/21 for 3 days of progressive fatigue and weakness. On arrival she was febrile and tachycardic with AMS. She was admitted to ICU for sepsis complicated by acute on chronic renal failure with hyperkalemia. TTE was obtained to evaluate for evidence of endocarditis in this patient with vascular access on HD. She was found to have new, severe tricuspid regurgitation, but no evidence of endocarditis on TTE. That study was also notable for LVEF of 45-50% by estimation with global hypokinesis.  During my interview, her she seems largely inattentive. Family is present at bedside to supplement  history. She denies chest pain and shortness of breath. She hasn't experienced significant lower extremity edema. She has been receiving dialysis for 3-4 years at this point.  Past Medical History:  Diagnosis Date   Abnormal stress test    Anemia    Angio-edema    Asthma    CAD (coronary artery disease)    DES to mid LAD July 2018, residual moderate RCA disease   Cataract    CKD (chronic kidney disease) stage 4, GFR 15-29 ml/min (HCC)    Dialysis T/Th/Sa   DVT (deep venous thrombosis) ([place])    1996 during pregnancy, 2015 left leg   Eczema    Essential hypertension 12/11/2015   Gastroparesis    GERD (gastroesophageal reflux disease)    Gluteal abscess 12/27/2018   Hidradenitis    Migraine    Neuropathy    Peripheral vascular disease (HCC)    blood clot in leg   Progressive angina ([place]) 06/05/2014   Chest pain   Type 2 diabetes mellitus ([place])    Type II diabetes mellitus ([place])    Urticaria     Past Surgical History:  Procedure Laterality Date   A/V FISTULAGRAM N/A 06/22/2017   Procedure: A/V FISTULAGRAM - left arm;  Surgeon: [name], MD;  Location: [facility];  Service: Cardiovascular;  Laterality: N/A;   ABDOMINAL AORTOGRAM W/LOWER EXTREMITY Bilateral 12/14/2018   Procedure: ABDOMINAL AORTOGRAM W/LOWER EXTREMITY;  Surgeon: [name], MD;  Location: [facility];  Service: Cardiovascular;  Laterality: Bilateral;   ABDOMINAL AORTOGRAM W/LOWER EXTREMITY N/A 04/25/2019  Procedure: ABDOMINAL AORTOGRAM W/LOWER EXTREMITY;  Surgeon: [name], MD;  Location: [facility];  Service: Cardiovascular;  Laterality: N/A;   ABDOMINAL AORTOGRAM W/LOWER EXTREMITY Left 07/18/2019   Procedure: ABDOMINAL AORTOGRAM W/LOWER EXTREMITY;  Surgeon: [name], MD;  Location: [facility];  Service: Cardiovascular;  Laterality: Left;   ABDOMINOPLASTY     ADENOIDECTOMY     APPENDECTOMY  1995   AV FISTULA PLACEMENT Left 02/01/2017    Procedure: ARTERIOVENOUS BRACHIOCEPHALIC (AV) FISTULA CREATION;  Surgeon: [name], MD;  Location: [facility];  Service: Vascular;  Laterality: Left;   [place] INTERVENTION N/A 08/05/2016   Procedure: Coronary Stent Intervention;  Surgeon: [name], MD;  Location: [facility];  Service: Cardiovascular;  Laterality: N/A;   ESOPHAGOGASTRODUODENOSCOPY (EGD) WITH PROPOFOL N/A 07/01/2020   Procedure: ESOPHAGOGASTRODUODENOSCOPY (EGD) WITH PROPOFOL;  Surgeon: Gatha Mayer, MD;  Location: [facility];  Service: Endoscopy;  Laterality: N/A;   EXPLORATORY LAPAROTOMY  08/14/2005   lysis of adhesions, drainage of tubo-ovarian abscess   FISTULA SUPERFICIALIZATION Left 09/14/2017   Procedure: FISTULA SUPERFICIALIZATION ARTERIOVENOUS FISTULA LEFT ARM;  Surgeon: [name], MD;  Location: [facility];  Service: Vascular;  Laterality: Left;   FLEXIBLE SIGMOIDOSCOPY N/A 01/04/2019   Procedure: FLEXIBLE SIGMOIDOSCOPY;  Surgeon: Clarene Essex, MD;  Location: [facility];  Service: Endoscopy;  Laterality: N/A;   HYDRADENITIS EXCISION  02/08/2011   Procedure: EXCISION HYDRADENITIS GROIN;  Surgeon: [name], MD;  Location: [facility];  Service: General;  Laterality: N/A;  Excisioin of Hidradenitis Left groin   INCISION AND DRAINAGE ABSCESS N/A 01/11/2019   Procedure: INCISION AND DRAINAGE SACRAL ABSCESS;  Surgeon: Georganna Skeans, MD;  Location: [facility];  Service: General;  Laterality: N/A;   INGUINAL HIDRADENITIS EXCISION  07/06/2010   bilateral   INSERTION OF DIALYSIS CATHETER N/A 09/14/2017   Procedure: INSERTION OF TUNNELED DIALYSIS CATHETER Right Internal Jugular;  Surgeon: [name], MD;  Location: [facility];  Service: Vascular;  Laterality: N/A;   IR FLUORO GUIDE CV LINE RIGHT  03/01/2019   IR US GUIDE BX ASP/DRAIN  01/01/2019   IR US GUIDE VASC ACCESS RIGHT  03/01/2019   LEFT HEART  CATH AND CORONARY ANGIOGRAPHY N/A 08/05/2016   Procedure: Left Heart Cath and Coronary Angiography;  Surgeon: [name], MD;  Location: [facility];  Service: Cardiovascular;  Laterality: N/A;   PERIPHERAL VASCULAR INTERVENTION Left 12/14/2018   Procedure: PERIPHERAL VASCULAR INTERVENTION;  Surgeon: [name], MD;  Location: [facility];  Service: Cardiovascular;  Laterality: Left;  SFA   PERIPHERAL VASCULAR INTERVENTION Left 04/25/2019   Procedure: PERIPHERAL VASCULAR INTERVENTION;  Surgeon: [name], MD;  Location: [facility];  Service: Cardiovascular;  Laterality: Left;  right superficial femoral, and left iliac   PERIPHERAL VASCULAR INTERVENTION Left 07/18/2019   Procedure: PERIPHERAL VASCULAR INTERVENTION;  Surgeon: [name], MD;  Location: [facility];  Service: Cardiovascular;  Laterality: Left;  external iliac   REDUCTION MAMMAPLASTY  2002   TONSILLECTOMY     TUBAL LIGATION  1996   VAGINAL HYSTERECTOMY  08/04/2005   and cysto     Home Medications:  Prior to Admission medications   Medication Sig Start Date End Date Taking? Authorizing Provider  acetaminophen-codeine (TYLENOL #3) 300-30 MG tablet Take 1 tablet by mouth at bedtime as needed  for moderate pain. 05/11/21  Yes Newt Minion, MD  albuterol (VENTOLIN HFA) 108 (90 Base) MCG/ACT inhaler Inhale 1-2 puffs into the lungs every 6 (six) hours as needed for wheezing or shortness of breath. 07/02/20  Yes Mariel Aloe, MD  amLODipine (NORVASC) 10 MG tablet TAKE 1 TABLET BY MOUTH EVERY DAY Patient taking differently: Take 10 mg by mouth daily. 09/10/21  Yes [name], MD  aspirin 81 MG chewable tablet Chew 1 tablet (81 mg total) by mouth daily. 08/07/16  Yes Reino Bellis B, NP  atorvastatin (LIPITOR) 40 MG tablet TAKE 1 TABLET BY MOUTH EVERY DAY Patient taking differently: Take 40 mg by mouth daily. 12/22/20  Yes [name], MD  B Complex-C-Zn-Folic  Acid (DIALYVITE/ZINC) TABS Take 1 tablet by mouth daily. 03/25/20  Yes [provider]  calcitRIOL (ROCALTROL) 0.5 MCG capsule Take 1.5 mcg by mouth daily. 09/11/18  Yes [provider]  calcium acetate (PHOSLO) 667 MG capsule Take 667 mg by mouth 3 (three) times daily with meals. 09/23/17  Yes [provider]  clopidogrel (PLAVIX) 75 MG tablet TAKE 1 TABLET (75 MG TOTAL) BY MOUTH DAILY WITH BREAKFAST. 07/28/20  Yes [name], MD  cyclobenzaprine (FLEXERIL) 10 MG tablet Take 1 tablet (10 mg total) by mouth 2 (two) times daily as needed. 08/06/20  Yes Newlin, Enobong, MD  fluticasone furoate-vilanterol (BREO ELLIPTA) 200-25 MCG/ACT AEPB Inhale 1 puff into the lungs daily. 11/05/21  Yes Argentina Donovan, PA-C  gabapentin (NEURONTIN) 300 MG capsule Take 300 mg by mouth 3 (three) times daily. 10/27/21  Yes [provider]  hydrALAZINE (APRESOLINE) 50 MG tablet Take 50 mg by mouth every 8 (eight) hours. 06/18/20  Yes [provider]  ipratropium-albuterol (DUONEB) 0.5-2.5 (3) MG/3ML SOLN Take 3 mLs by nebulization every 6 (six) hours as needed. 07/02/20  Yes Mariel Aloe, MD  lanthanum (FOSRENOL) 1000 MG chewable tablet Chew 1,000 mg by mouth 5 (five) times daily as needed. 10/11/21  Yes [provider]  Methoxy PEG-Epoetin Beta (MIRCERA IJ) Inject into the skin. 07/09/19  Yes [provider]  metoprolol succinate (TOPROL-XL) 50 MG 24 hr tablet TAKE 1.5 TABLETS BY MOUTH DAILY 07/28/20  Yes [name], MD  montelukast (SINGULAIR) 10 MG tablet Take 1 tablet (10 mg total) by mouth at bedtime. 11/05/21  Yes Freeman Caldron M, PA-C  ondansetron (ZOFRAN ODT) 4 MG disintegrating tablet Take 1 tablet (4 mg total) by mouth every 8 (eight) hours as needed for nausea or vomiting. 07/02/20  Yes Mariel Aloe, MD  oxyCODONE-acetaminophen (PERCOCET/ROXICET) 5-325 MG tablet Take 1 tablet by mouth every 8 (eight) hours as needed for severe pain.  08/05/21  Yes Suzan Slick, NP  promethazine (PHENERGAN) 25 MG tablet Take 1 tablet (25 mg total) by mouth every 8 (eight) hours as needed for nausea or vomiting. 07/30/21  Yes Newlin, Enobong, MD  sucralfate (CARAFATE) 1 GM/10ML suspension Take 10 mLs (1 g total) by mouth 4 (four) times daily -  with meals and at bedtime. 03/22/20  Yes Jacqlyn Larsen, PA-C  HYDROcodone-acetaminophen (NORCO) 10-325 MG tablet Take 0.5 tablets by mouth every 8 (eight) hours as needed for severe pain. Patient not taking: Reported on 11/08/2021 05/10/21   Margarita Mail, PA-C  iron sucrose (VENOFER) 20 MG/ML injection Iron Sucrose (Venofer) Patient not taking: Reported on 11/08/2021 07/11/19   [provider]  isosorbide mononitrate (IMDUR) 30 MG 24 hr tablet Take 15 mg by mouth  daily as needed (chest pain).    [provider]  Misc. Devices MISC Electric scooter for mobility 06/24/21   Charlott Rakes, MD  nitroGLYCERIN (NITROSTAT) 0.4 MG SL tablet Place 1 tablet (0.4 mg total) under the tongue every 5 (five) minutes as needed for chest pain. 08/06/19   [name], MD  pantoprazole (PROTONIX) 40 MG tablet Take 1 tablet (40 mg total) by mouth daily. Patient not taking: Reported on 11/08/2021 07/02/20   Mariel Aloe, MD  PARoxetine (PAXIL) 20 MG tablet Take 1 tablet (20 mg total) by mouth daily. For hot flashes Patient not taking: Reported on 11/08/2021 07/30/21   Charlott Rakes, MD  PRALUENT 75 MG/ML SOAJ INJECT 75 MG INTO THE SKIN EVERY 14 (FOURTEEN) DAYS. 02/07/20   [name], MD    Inpatient Medications: Scheduled Meds:  (feeding supplement) PROSource Plus  30 mL Oral BID BM   aspirin  81 mg Oral Daily   Chlorhexidine Gluconate Cloth  6 each Topical Daily   clopidogrel  75 mg Oral Q breakfast   darbepoetin (ARANESP) injection - NON-DIALYSIS  200 mcg Subcutaneous Q Mon-1800   doxercalciferol  7 mcg Intravenous Q M,W,F-HD   fluticasone furoate-vilanterol  1 puff Inhalation Daily    heparin  4,000 Units Dialysis Once in dialysis   heparin injection (subcutaneous)  5,000 Units Subcutaneous Q8H   insulin aspart  0-5 Units Subcutaneous QHS   insulin aspart  0-6 Units Subcutaneous TID WC   lanthanum  1,000 mg Oral TID WC   lidocaine  1 patch Transdermal Q24H   metoprolol succinate  25 mg Oral Daily   montelukast  10 mg Oral QHS   pantoprazole (PROTONIX) IV  40 mg Intravenous QHS   thiamine (VITAMIN B1) injection  100 mg Intravenous Daily   Continuous Infusions:  piperacillin-tazobactam (ZOSYN)  IV 2.25 g (11/13/21 0618)   PRN Meds: acetaminophen, HYDROmorphone (DILAUDID) injection, ipratropium-albuterol, lidocaine (PF), lidocaine-prilocaine, loperamide, mouth rinse, pentafluoroprop-tetrafluoroeth, polyethylene glycol  Allergies:    Allergies  Allergen Reactions   Repatha [Evolocumab] Itching   Lisinopril Cough    Social History:   Social History   Socioeconomic History   Marital status: Single    Spouse name: Not on file   Number of children: 3   Years of education: 14   Highest education level: Not on file  Occupational History   Occupation: Curator  Tobacco Use   Smoking status: Former    Years: 20.00    Types: Cigarettes    Quit date: 11/2016    Years since quitting: 5.0   Smokeless tobacco: Never  Vaping Use   Vaping Use: Never used  Substance and Sexual Activity   Alcohol use: No    Alcohol/week: 0.0 standard drinks of alcohol   Drug use: Yes    Frequency: 2.0 times per week    Types: Marijuana   Sexual activity: Not on file  Other Topics Concern   Not on file  Social History Narrative   Lives with daughter in a 2 story home.  Has 3 children.  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  Education: college.    Social Determinants of Health   Financial Resource Strain: Not on file  Food Insecurity: No Food Insecurity (08/11/2021)   Hunger Vital Sign    Worried About Running Out of Food in the Last Year:  Never true    Ran Out of Food in the Last Year: Never true  Transportation Needs:  No Transportation Needs (08/11/2021)   PRAPARE - Hydrologist (Medical): No    Lack of Transportation (Non-Medical): No  Physical Activity: Inactive (10/05/2020)   Exercise Vital Sign    Days of Exercise per Week: 0 days    Minutes of Exercise per Session: 0 min  Stress: Not on file  Social Connections: Unknown (10/05/2020)   Social Connection and Isolation Panel [NHANES]    Frequency of Communication with Friends and Family: More than three times a week    Frequency of Social Gatherings with Friends and Family: More than three times a week    Attends Religious Services: 1 to 4 times per year    Active Member of [employer] or Organizations: No    Attends Music therapist: Never    Marital Status: Not on file  Intimate Partner Violence: Not on file    Family History:    Family History  Problem Relation Age of Onset   Allergic rhinitis Mother    Hypertension Father    Allergic rhinitis Father    Hypertension Sister    Allergic rhinitis Sister    Hypertension Brother    Allergic rhinitis Brother    Breast cancer Cousin        x2     ROS:  Please see the history of present illness.   All other ROS reviewed and negative.     Physical Exam/Data:   Vitals:   11/13/21 1200 11/13/21 1230 11/13/21 1300 11/13/21 1430  BP: (!) 166/87 (!) 154/71    Pulse:      Resp: '19 19  19  '$ Temp:      TempSrc:      SpO2: 99% 99% 99%   Weight:       No intake or output data in the 24 hours ending 11/13/21 1443    11/13/2021    6:16 AM 11/11/2021    5:01 AM 11/11/2021    5:00 AM  Last 3 Weights  Weight (lbs) 151 lb 3.8 oz 151 lb 14.4 oz 151 lb 14.4 oz  Weight (kg) 68.6 kg 68.9 kg 68.9 kg     Body mass index is 25.17 kg/m.  General:  Ill-appearing. No apparent acute distress. HEENT: normal Neck: no JVD, no hepatojugular reflux Vascular: Thready peripheral  pulses. Cardiac:  Systolic murmur Lungs:  clear to auscultation bilaterally, no wheezing, rhonchi or rales  Abd: soft, nontender, no hepatomegaly  Ext: no edema Musculoskeletal:  No deformities, BUE and BLE strength normal and equal Skin: warm and dry  Neuro:  no focal abnormalities noted  EKG:  The EKG was personally reviewed and demonstrates:  Sinus tachycardia with PACs Telemetry:  Telemetry was personally reviewed and demonstrates:  Sinus rhythm with PACs and some variable P-wave morphology  Relevant CV Studies: Echo 11/09/21 -  1. Left ventricular ejection fraction, by estimation, is 45 to 50%. The  left ventricle has mildly decreased function. The left ventricle  demonstrates global hypokinesis. There is mild left ventricular  hypertrophy. Left ventricular diastolic parameters  are indeterminate.   2. Right ventricular systolic function is moderately reduced. The right  ventricular size is moderately enlarged. There is mildly elevated  pulmonary artery systolic pressure. The estimated right ventricular  systolic pressure is 30.8 mmHg.   3. Tricuspid valve regurgitation is severe.   4. Left atrial size was mild to moderately dilated.   5. The mitral valve is degenerative. Trivial mitral valve regurgitation.  No evidence of mitral stenosis.  Severe mitral annular calcification.   6. The aortic valve is abnormal, tricuspid with moderately reduced cusp  excursion. There is moderate calcification of the aortic valve. Aortic  valve regurgitation is not visualized. Aortic valve  sclerosis/calcification is present, without any evidence  of aortic stenosis. Aortic valve mean gradient measures 7.0 mmHg. Stroke  volume index is low, 30 mL/m2, suggesting aortic valve gradients may be  underestimated.   7. The inferior vena cava is dilated in size with <50% respiratory  variability, suggesting right atrial pressure of 15 mmHg.   Laboratory Data:  High Sensitivity Troponin:  No results  for input(s): "TROPONINIHS" in the last 720 hours.   Chemistry Recent Labs  Lab 11/09/21 0310 11/10/21 0924 11/11/21 0256 11/12/21 0512 11/13/21 0148  NA 137  135   < > 137  136 134* 135  K 3.9  3.8   < > 5.2*  5.2* 4.2 4.3  CL 106  105   < > 103  102 94* 94*  CO2 17*  17*   < > 21*  21* 23 25  GLUCOSE 72  71   < > 137*  137* 84 94  BUN 47*  49*   < > 67*  72* 35* 48*  CREATININE 4.18*  4.26*   < > 7.62*  7.54* 4.83* 6.05*  CALCIUM 7.2*  7.2*   < > 8.0*  7.9* 8.4* 8.4*  MG 1.7  --  2.5*  --   --   GFRNONAA 12*  12*   < > 6*  6* 10* 8*  ANIONGAP 14  13   < > 13  13 17* 16*   < > = values in this interval not displayed.    Recent Labs  Lab 11/08/21 0929 11/08/21 1635 11/09/21 0310 11/11/21 0256 11/12/21 0512 11/13/21 0148  PROT 7.2  --  4.6*  --   --   --   ALBUMIN 2.4*   < > 1.6*  1.6* 2.2* 1.9* 1.8*  AST 21  --  39  --   --   --   ALT 9  --  17  --   --   --   ALKPHOS 171*  --  160*  --   --   --   BILITOT 0.6  --  0.6  --   --   --    < > = values in this interval not displayed.   Lipids No results for input(s): "CHOL", "TRIG", "HDL", "LABVLDL", "LDLCALC", "CHOLHDL" in the last 168 hours.  Hematology Recent Labs  Lab 11/10/21 0408 11/11/21 0256 11/12/21 0512  WBC 13.0* 13.0* 13.0*  RBC 3.14* 3.22* 3.16*  HGB 7.4* 7.6* 7.5*  HCT 25.4* 25.9* 25.4*  MCV 80.9 80.4 80.4  MCH 23.6* 23.6* 23.7*  MCHC 29.1* 29.3* 29.5*  RDW 18.8* 19.1* 19.5*  PLT 312 327 376   Thyroid No results for input(s): "TSH", "FREET4" in the last 168 hours.  BNPNo results for input(s): "BNP", "PROBNP" in the last 168 hours.  DDimer No results for input(s): "DDIMER" in the last 168 hours.   Radiology/Studies:  No results found.   Assessment and Plan:   Tricuspid regurgitation Systolic murmur consistent with tricuspid regurgitation present on exam. No signs of overt right heart failure. Echo without signs of RA enlargement. Moderately increased RV size with  evidence of elevated pulmonary artery systolic pressure on echo in setting of chronic diastolic HF suggests some element of ongoing pulmonary arterial hypertension, likely compounded in  setting of acute illness and evidence of infiltrates on recent CXR which could cause increased pulmonary vascular resistance. An acutely elevated pulmonary arterial pressure could worsen chronic mild TR; however, at this time it is not causing right heart failure or decreased cardiac output. Thus, I don't suspect that it is contributing significantly to her current clinical syndrome. If this were due to tricuspid valve endocarditis I would expect to see evidence of septic emboli in the lungs. May consider TEE if there remains a high index of suspicion for tricuspid valve endocarditis, otherwise continue to treat other more likely culprits for her sepsis and encephalopathy. -Continue to manage volume status with dialysis -Monitor for signs of right heart failure including peripheral edema and JVD  Irregular sinus tachycardia P-waves present on tracings. Suspect sinus tachycardia with ectopy and PACs in setting of severe acute illness rather than A-fib at this time.  Risk Assessment/Risk Scores:            [facility] will sign off. No changes to current workup or treatment are recommended.  For questions or updates, please contact [place] Please consult [email or web address] for contact info under    Signed, Nani Gasser, MD  11/13/2021 2:43 PM

## 2021-11-13 NOTE — Progress Notes (Signed)
Called over by dialysis RN to evaluate rhythm. Irreg irregular on exam. Telemetry with irregular rhythm as well - rate 100-110 range. Patient is asymptomatic - no dyspnea or palpitations. No known prior Hx A-fib. Will get 12-lead EKG and notify her primary hospitalist.  Veneta Penton, PA-C [facility] Pager [phone]

## 2021-11-13 NOTE — Progress Notes (Addendum)
Received patient in bed to unit.  Alert  Informed consent signed and in chart.   Treatment initiated: 1049 Treatment completed: 1430  Patient tolerated well.  Transported back to the room  Alert, without acute distress.  Hand-off given to patient's nurse.   Access used: L fistula Access issues: n/a  Total UF removed: 0 Medication(s) given: metoprolol  Post HD weight: 67.8KG  Patient developed A-fib while in dialysis. MD schertz was called to bedside. EKG completed, '5MG'$  of metoprolol was ordered and administered.      11/13/21 1430  Vitals  Temp 98.1 F (36.7 C)  Temp Source Oral  BP (!) 170/68  MAP (mmHg) 98  BP Location Right Arm  BP Method Automatic  Patient Position (if appropriate) Lying  Pulse Rate 95  Pulse Rate Source Monitor  ECG Heart Rate 97  Resp 19  Oxygen Therapy  SpO2 98 %  O2 Device Room Air  During Treatment Monitoring  Blood Flow Rate (mL/min) 350 mL/min  Arterial Pressure (mmHg) -120 mmHg  Venous Pressure (mmHg) 210 mmHg  TMP (mmHg) 6 mmHg  Ultrafiltration Rate (mL/min) 165 mL/min  Dialysate Flow Rate (mL/min) 300 ml/min  HD Safety Checks Performed Yes  Intra-Hemodialysis Comments Tx completed        [facility]

## 2021-11-13 NOTE — Progress Notes (Signed)
[facility] Progress Note   Subjective:  Seen in room - tired, but much clearer mental status and no myoclonic jerking today that I can see. L hip with persistent drainage, cultured again per notes. She is having significant L foot pain  - hospitalist getting xray and uric acid levels. She denies CP or dyspnea. For HD later today.  Objective Vitals:   11/12/21 2209 11/13/21 0041 11/13/21 0616 11/13/21 0755  BP: (!) 130/52 130/65 137/63 (!) 126/52  Pulse: 89 85 92 89  Resp: '16 16 18 18  '$ Temp: 98.9 F (37.2 C) 97.8 F (36.6 C) 98.8 F (37.1 C) 98.4 F (36.9 C)  TempSrc: Oral Oral Oral Oral  SpO2: 97% 98% 96% 96%  Weight:   68.6 kg    Physical Exam General: Chronically ill appearing woman, NAD. Room air. Tired, but much improved MS. Heart: RRR; no murmur Lungs: CTA anteriorly Abdomen: soft Extremities: No LE edema; L foot exquisitely tender. L hip with large, soiled bandage Dialysis Access: L AVF + bruit  Additional Objective Labs: Basic Metabolic Panel: Recent Labs  Lab 11/11/21 0256 11/12/21 0512 11/13/21 0148  NA 137  136 134* 135  K 5.2*  5.2* 4.2 4.3  CL 103  102 94* 94*  CO2 21*  21* 23 25  GLUCOSE 137*  137* 84 94  BUN 67*  72* 35* 48*  CREATININE 7.62*  7.54* 4.83* 6.05*  CALCIUM 8.0*  7.9* 8.4* 8.4*  PHOS 9.1*  9.0* 6.4* 7.0*   Liver Function Tests: Recent Labs  Lab 11/08/21 0929 11/08/21 1635 11/09/21 0310 11/11/21 0256 11/12/21 0512 11/13/21 0148  AST 21  --  39  --   --   --   ALT 9  --  17  --   --   --   ALKPHOS 171*  --  160*  --   --   --   BILITOT 0.6  --  0.6  --   --   --   PROT 7.2  --  4.6*  --   --   --   ALBUMIN 2.4*   < > 1.6*  1.6* 2.2* 1.9* 1.8*   < > = values in this interval not displayed.   CBC: Recent Labs  Lab 11/09/21 0310 11/09/21 0403 11/09/21 1030 11/10/21 0408 11/11/21 0256 11/12/21 0512  WBC 9.6 11.4*  --  13.0* 13.0* 13.0*  HGB 5.3* 6.6*   < > 7.4* 7.6* 7.5*  HCT 18.3* 23.1*   < >  25.4* 25.9* 25.4*  MCV 79.2* 79.4*  --  80.9 80.4 80.4  PLT 237 278  --  312 327 376   < > = values in this interval not displayed.   Blood Culture    Component Value Date/Time   SDES WOUND HIP 11/12/2021 1425   SPECREQUEST  LEFT 11/12/2021 1425   CULT PENDING 11/12/2021 1425   REPTSTATUS PENDING 11/12/2021 1425   Medications:  piperacillin-tazobactam (ZOSYN)  IV 2.25 g (11/13/21 0618)    aspirin  81 mg Oral Daily   calcitRIOL  1.5 mcg Oral Daily   calcium acetate  667 mg Oral TID WC   Chlorhexidine Gluconate Cloth  6 each Topical Daily   clopidogrel  75 mg Oral Q breakfast   darbepoetin (ARANESP) injection - NON-DIALYSIS  200 mcg Subcutaneous Q Mon-1800   fluticasone furoate-vilanterol  1 puff Inhalation Daily   heparin  4,000 Units Dialysis Once in dialysis   heparin injection (subcutaneous)  5,000 Units  Subcutaneous Q8H   insulin aspart  0-5 Units Subcutaneous QHS   insulin aspart  0-6 Units Subcutaneous TID WC   montelukast  10 mg Oral QHS   pantoprazole (PROTONIX) IV  40 mg Intravenous QHS   thiamine (VITAMIN B1) injection  100 mg Intravenous Daily    Dialysis Orders: [facility] 3h 20mn 400/600  61kg  2/2 bath P2  Hep 4000  LUE AVF - mircera 200 mcg IV q2, last 10/16, due 10/30 - venofer '100mg'$  IV tiw thru 11/06 - doxercalciferol 7 ug tiw - sod thiosulfate 25 gm iv tiw  Assessment/Plan: Septic shock: Febrile - ?hip infection v. HCAP, was on Vanc/Cefepime/Flagyl -> now on Zosyn. ESRD: Required CRRT on admit due to AMS, high BUN, etc. Now back to iHD as of 11/22/21 -> for HD today to get back on usual MWF schedule. Still has R IJ temp cath - if remains stable today, can get it out. Uses L AVF for dialysis. Encephalopathy: D/t shock/?meds - much improved now. Myoclonus: D/t meds most likely - resolved now. Hyperkalemia: On admit, resolved with dialysis. HTN/volume: BP controlled, euvolemic on exam. Anemia of ESRD: Hgb 7.5 - continue max dose Aranesp 2056m weekly while  here. Secondary HPTH: CorrCa ok, Phos high - she doesn't use Phoslo, changed to her usual outpatient binder - Fosrenol '1000mg'$  TID.  Nutrition: Alb low, adding supplements. 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 [facility] op note described 1 cup liquid being removed. Per our imaging her, size appears the same which is unfortunate. Would still with ?pus drainage - re-cultured 11/2. T2DM with severe gastroparesis: Ongoing issue, s/p esophageal myotomy 09/17/21. Has issues swallow/keeping pills down. CAD/PAD    KaVeneta PentonPA-C 11/13/2021, 9:54 AM  [facility]

## 2021-11-14 DIAGNOSIS — E875 Hyperkalemia: Secondary | ICD-10-CM | POA: Diagnosis not present

## 2021-11-14 DIAGNOSIS — R4182 Altered mental status, unspecified: Secondary | ICD-10-CM | POA: Diagnosis not present

## 2021-11-14 DIAGNOSIS — A419 Sepsis, unspecified organism: Secondary | ICD-10-CM | POA: Diagnosis not present

## 2021-11-14 LAB — GLUCOSE, CAPILLARY
Glucose-Capillary: 101 mg/dL — ABNORMAL HIGH (ref 70–99)
Glucose-Capillary: 99 mg/dL (ref 70–99)
Glucose-Capillary: 104 mg/dL — ABNORMAL HIGH (ref 70–99)
Glucose-Capillary: 133 mg/dL — ABNORMAL HIGH (ref 70–99)

## 2021-11-14 LAB — AEROBIC CULTURE W GRAM STAIN (SUPERFICIAL SPECIMEN)
Culture: NO GROWTH
Gram Stain: NONE SEEN

## 2021-11-14 LAB — RENAL FUNCTION PANEL
Albumin: 1.9 g/dL — ABNORMAL LOW (ref 3.5–5.0)
Anion gap: 19 — ABNORMAL HIGH (ref 5–15)
BUN: 24 mg/dL — ABNORMAL HIGH (ref 6–20)
CO2: 25 mmol/L (ref 22–32)
Calcium: 9 mg/dL (ref 8.9–10.3)
Chloride: 93 mmol/L — ABNORMAL LOW (ref 98–111)
Creatinine, Ser: 3.9 mg/dL — ABNORMAL HIGH (ref 0.44–1.00)
GFR, Estimated: 13 mL/min — ABNORMAL LOW (ref >60)
Glucose, Bld: 86 mg/dL (ref 70–99)
Phosphorus: 6.1 mg/dL — ABNORMAL HIGH (ref 2.5–4.6)
Potassium: 4.5 mmol/L (ref 3.5–5.1)
Sodium: 137 mmol/L (ref 135–145)

## 2021-11-14 MED ORDER — ONDANSETRON HCL 4 MG/2ML IJ SOLN
4.0000 mg | Freq: Four times a day (QID) | INTRAMUSCULAR | Status: DC | PRN
  Administered 2021-11-14 – 2021-11-19 (×4): 4 mg via INTRAVENOUS
  Filled 2021-11-14 (×4): qty 2

## 2021-11-14 MED ORDER — HYDROMORPHONE HCL 1 MG/ML IJ SOLN
0.5000 mg | INTRAMUSCULAR | Status: AC
  Administered 2021-11-14: 0.5 mg via INTRAVENOUS

## 2021-11-14 MED ORDER — HYDROMORPHONE HCL 1 MG/ML IJ SOLN
INTRAMUSCULAR | Status: AC
  Filled 2021-11-14: qty 1

## 2021-11-14 MED ORDER — OXYCODONE HCL 5 MG PO TABS
5.0000 mg | ORAL_TABLET | ORAL | Status: DC | PRN
  Administered 2021-11-14 – 2021-11-18 (×15): 5 mg via ORAL
  Filled 2021-11-14 (×15): qty 1

## 2021-11-14 NOTE — Progress Notes (Signed)
Physical Therapy Treatment Patient Details Name: Susan Horton MRN: [id number] DOB: December 07, 1970 Today's Date: 11/14/2021   History of Present Illness 51 y/o female presented to ED on 11/08/21 for progressive fatigue and weakness after fall on 10/27. Admitted for septic shock with unclear origin. Recent L hip tumoral calcinosis surgery a few weeks ago at [facility] with associated drainage from L hip. PMH: ESRD, CAD, HTN, CHF, T2DM with peripheral neuropathy and gastroparesis.    PT Comments    Patient more alert and eager to participate in therapy despite pain in Lt LE, primarily foot. Pt was able to follow cues for sequencing bed mobility and initiate reaching for bed rail to turn trunk. Assist needed to bring LE's off EOB and control lowering Lt leg to control pain. Posterior lean present in sitting initially but pt improved to min guard/supervision for seated balance. Attempted sit<>stand 2x form elevated surface but unable to initiate through Rt LE. Pt required to supine and repositioned. LE exercises completed for Bil gluteal and quad strengthening. Educated pt and pt's daughter on exercises and encouraged daughter to assist with support at Lt heel to reduce pain and facilitate strengthening of Lt LE. Pt reports her sister provides the majority of support at home. Continue to recommend intense rehab follow up at [facility]. Will continue to progress as able.   Recommendations for follow up therapy are one component of a multi-disciplinary discharge planning process, led by the attending physician.  Recommendations may be updated based on patient status, additional functional criteria and insurance authorization.  Follow Up Recommendations  Acute inpatient rehab (3hours/day)     Assistance Recommended at Discharge Frequent or constant Supervision/Assistance  Patient can return home with the following A lot of help with walking and/or transfers;A lot of help with bathing/dressing/bathroom;Assistance with  cooking/housework;Direct supervision/assist for financial management;Direct supervision/assist for medications management;Assist for transportation;Help with stairs or ramp for entrance   Equipment Recommendations   (TBD)    Recommendations for Other Services Rehab consult     Precautions / Restrictions Precautions Precautions: Fall Precaution Comments: fall, recent hip sx to L hip Restrictions Weight Bearing Restrictions: No     Mobility  Bed Mobility Overal bed mobility: Needs Assistance Bed Mobility: Supine to Sit, Sit to Supine, Rolling Rolling: Min assist   Supine to sit: Mod assist, +2 for safety/equipment, HOB elevated Sit to supine: Mod assist, +2 for safety/equipment   General bed mobility comments: Cues for sequencing and hand palcement to use bed rail. Assist to bring bil LE's off EOB and to raise trunk upright. +2 assist posteriorly to maintain upright sitting. bed pad used to pivot to EOB. Mod Assist to control lower and return to supine. Min assist to roll Lt for pad repositioning.    Transfers Overall transfer level: Needs assistance Equipment used: Rolling walker (2 wheels) Transfers: Sit to/from Stand Sit to Stand: Max assist, +2 physical assistance, +2 safety/equipment, From elevated surface           General transfer comment: attempted 2x from EOB, (nursing student as +2 assist). pt unable to initiate through Rt LE and UE's and avoiding WB on Lt LE due to foot pain.    Ambulation/Gait                   Stairs             Wheelchair Mobility    Modified Rankin (Stroke Patients Only)       Balance Overall balance assessment: Needs assistance Sitting-balance  support: Feet supported Sitting balance-Leahy Scale: Fair Sitting balance - Comments: mod assist at start to maintain upright but progressed to min guard/supervision with UE support on bed. Postural control: Posterior lean                                   Cognition Arousal/Alertness: Awake/alert Behavior During Therapy: WFL for tasks assessed/performed Overall Cognitive Status: Within Functional Limits for tasks assessed                                 General Comments: pt awake and alert, engaging in conversation. appears oriented x4. pt able to discuss PLOF and express needs. follows cues well. responds well to encouragement.        Exercises General Exercises - Lower Extremity Ankle Circles/Pumps: PROM, Left, 5 reps, Limitations Ankle Circles/Pumps Limitations: hand palcement on heel to facilitate ankle, 0/5 for dorsiflexor activation Gluteal Sets: AROM, Both, 10 reps Short Arc Quad: AROM, Both, AAROM, 10 reps, Limitations Short Arc Quad Limitations: AAROM on Lt LE    General Comments        Pertinent Vitals/Pain Pain Assessment Pain Assessment: Faces Faces Pain Scale: Hurts even more Pain Location: Lt foot Pain Descriptors / Indicators: Grimacing, Guarding Pain Intervention(s): Limited activity within patient's tolerance, Repositioned, Monitored during session    Home Living                          Prior Function            PT Goals (current goals can now be found in the care plan section) Acute Rehab PT Goals Patient Stated Goal: get better and back to walking PT Goal Formulation: With family Time For Goal Achievement: 11/26/21 Potential to Achieve Goals: Fair Progress towards PT goals: Progressing toward goals    Frequency    Min 3X/week      PT Plan Current plan remains appropriate    Co-evaluation              AM-PAC PT "6 Clicks" Mobility   Outcome Measure  Help needed turning from your back to your side while in a flat bed without using bedrails?: A Little Help needed moving from lying on your back to sitting on the side of a flat bed without using bedrails?: A Lot Help needed moving to and from a bed to a chair (including a wheelchair)?: Total Help needed  standing up from a chair using your arms (e.g., wheelchair or bedside chair)?: Total Help needed to walk in hospital room?: Total Help needed climbing 3-5 steps with a railing? : Total 6 Click Score: 9    End of Session Equipment Utilized During Treatment: Gait belt Activity Tolerance: Patient tolerated treatment well Patient left: in bed;with call bell/phone within reach;with bed alarm set;with family/visitor present Nurse Communication: Mobility status PT Visit Diagnosis: Unsteadiness on feet (R26.81);Muscle weakness (generalized) (M62.81);History of falling (Z91.81);Difficulty in walking, not elsewhere classified (R26.2);Other symptoms and signs involving the nervous system (Z66.063)     Time: 0160-1093 PT Time Calculation (min) (ACUTE ONLY): 30 min  Charges:  $Therapeutic Exercise: 8-22 mins $Therapeutic Activity: 8-22 mins                     Verner Mould, DPT Acute Rehabilitation Services Office [phone]  11/14/21 4:14 PM

## 2021-11-14 NOTE — Consult Note (Addendum)
Hospital Consult    Reason for Consult: Left foot pain Requesting Physician: Dr. Eliseo Squires MRN #:  [id number]  History of Present Illness: This is a 51 y.o. female with end-stage renal disease who was initially admitted for fatigue and weakness.  She had left hip humeral calcinosis surgery at [facility] a few weeks ago with significant drainage from her left hip.  This was noted to be cloudy. She was originally placed in the ICU requiring pressor medications.  These have been weaned, and she is now on the floor.  Since her admission, she has also complained of left lower extremity pain.  Cellulitis was appreciated on admission, however has improved.  The left foot remains tender.  Vascular surgery was called due to the left foot tenderness and known peripheral arterial disease.  She was originally scheduled for left lower extremity angiography with Dr. Carlis Abbott a few days ago, however this was canceled due to her current clinical status.   On exam, Korrina describes significant foot pain that has been present since Monday.  She has not been up out of bed, and continues to lay on her right side in an effort to keep the weight off her left hip.  She has been nonambulatory since her hip surgery, and has not moved in the bed per both her and her daughter.  Vascular surgery history includes left EIA, SFA stenting.  Most recent imaging in October demonstrates high-grade SFA stenosis in the left with no toe pressure bilaterally.  Patient has both macrovascular disease as well as microvascular disease from longstanding end-stage renal disease and history of diabetes. R ABI demonstrates noncompressible vessels with no toe pressure appreciated bilaterally.  Past Medical History:  Diagnosis Date   Abnormal stress test    Anemia    Angio-edema    Asthma    CAD (coronary artery disease)    DES to mid LAD July 2018, residual moderate RCA disease   Cataract    CKD (chronic kidney disease) stage 4, GFR 15-29  ml/min (HCC)    Dialysis T/Th/Sa   DVT (deep venous thrombosis) ([place])    1996 during pregnancy, 2015 left leg   Eczema    Essential hypertension 12/11/2015   Gastroparesis    GERD (gastroesophageal reflux disease)    Gluteal abscess 12/27/2018   Hidradenitis    Migraine    Neuropathy    Peripheral vascular disease (HCC)    blood clot in leg   Progressive angina ([place]) 06/05/2014   Chest pain   Type 2 diabetes mellitus ([place])    Type II diabetes mellitus ([place])    Urticaria     Past Surgical History:  Procedure Laterality Date   A/V FISTULAGRAM N/A 06/22/2017   Procedure: A/V FISTULAGRAM - left arm;  Surgeon: [name], MD;  Location: [place] [facility];  Service: Cardiovascular;  Laterality: N/A;   ABDOMINAL AORTOGRAM W/LOWER EXTREMITY Bilateral 12/14/2018   Procedure: ABDOMINAL AORTOGRAM W/LOWER EXTREMITY;  Surgeon: [name], MD;  Location: [place] [facility];  Service: Cardiovascular;  Laterality: Bilateral;   ABDOMINAL AORTOGRAM W/LOWER EXTREMITY N/A 04/25/2019   Procedure: ABDOMINAL AORTOGRAM W/LOWER EXTREMITY;  Surgeon: [name], MD;  Location: [place] [facility];  Service: Cardiovascular;  Laterality: N/A;   ABDOMINAL AORTOGRAM W/LOWER EXTREMITY Left 07/18/2019   Procedure: ABDOMINAL AORTOGRAM W/LOWER EXTREMITY;  Surgeon: [name], MD;  Location: [place] [facility];  Service: Cardiovascular;  Laterality: Left;   ABDOMINOPLASTY     ADENOIDECTOMY  APPENDECTOMY  1995   AV FISTULA PLACEMENT Left 02/01/2017   Procedure: ARTERIOVENOUS BRACHIOCEPHALIC (AV) FISTULA CREATION;  Surgeon: [name], MD;  Location: [place];  Service: Vascular;  Laterality: Left;   [place]     CORONARY STENT INTERVENTION N/A 08/05/2016   Procedure: Coronary Stent Intervention;  Surgeon: [name], MD;  Location: [place] [facility];  Service: Cardiovascular;  Laterality: N/A;    ESOPHAGOGASTRODUODENOSCOPY (EGD) WITH PROPOFOL N/A 07/01/2020   Procedure: ESOPHAGOGASTRODUODENOSCOPY (EGD) WITH PROPOFOL;  Surgeon: Gatha Mayer, MD;  Location: [place];  Service: Endoscopy;  Laterality: N/A;   EXPLORATORY LAPAROTOMY  08/14/2005   lysis of adhesions, drainage of tubo-ovarian abscess   FISTULA SUPERFICIALIZATION Left 09/14/2017   Procedure: FISTULA SUPERFICIALIZATION ARTERIOVENOUS FISTULA LEFT ARM;  Surgeon: [name], MD;  Location: [place];  Service: Vascular;  Laterality: Left;   FLEXIBLE SIGMOIDOSCOPY N/A 01/04/2019   Procedure: FLEXIBLE SIGMOIDOSCOPY;  Surgeon: Clarene Essex, MD;  Location: [place];  Service: Endoscopy;  Laterality: N/A;   HYDRADENITIS EXCISION  02/08/2011   Procedure: EXCISION HYDRADENITIS GROIN;  Surgeon: [name], MD;  Location: [place];  Service: General;  Laterality: N/A;  Excisioin of Hidradenitis Left groin   INCISION AND DRAINAGE ABSCESS N/A 01/11/2019   Procedure: INCISION AND DRAINAGE SACRAL ABSCESS;  Surgeon: Georganna Skeans, MD;  Location: [place];  Service: General;  Laterality: N/A;   INGUINAL HIDRADENITIS EXCISION  07/06/2010   bilateral   INSERTION OF DIALYSIS CATHETER N/A 09/14/2017   Procedure: INSERTION OF TUNNELED DIALYSIS CATHETER Right Internal Jugular;  Surgeon: [name], MD;  Location: [place];  Service: Vascular;  Laterality: N/A;   IR FLUORO GUIDE CV LINE RIGHT  03/01/2019   IR US GUIDE BX ASP/DRAIN  01/01/2019   IR US GUIDE VASC ACCESS RIGHT  03/01/2019   LEFT HEART CATH AND CORONARY ANGIOGRAPHY N/A 08/05/2016   Procedure: Left Heart Cath and Coronary Angiography;  Surgeon: [name], MD;  Location: [place] [facility];  Service: Cardiovascular;  Laterality: N/A;   PERIPHERAL VASCULAR INTERVENTION Left 12/14/2018   Procedure: PERIPHERAL VASCULAR INTERVENTION;  Surgeon: [name], MD;  Location: [place] [facility];  Service: Cardiovascular;  Laterality: Left;  SFA    PERIPHERAL VASCULAR INTERVENTION Left 04/25/2019   Procedure: PERIPHERAL VASCULAR INTERVENTION;  Surgeon: [name], MD;  Location: [place] [facility];  Service: Cardiovascular;  Laterality: Left;  right superficial femoral, and left iliac   PERIPHERAL VASCULAR INTERVENTION Left 07/18/2019   Procedure: PERIPHERAL VASCULAR INTERVENTION;  Surgeon: [name], MD;  Location: [place] [facility];  Service: Cardiovascular;  Laterality: Left;  external iliac   REDUCTION MAMMAPLASTY  2002   TONSILLECTOMY     TUBAL LIGATION  1996   VAGINAL HYSTERECTOMY  08/04/2005   and cysto    Allergies  Allergen Reactions   Repatha [Evolocumab] Itching   Lisinopril Cough    Prior to Admission medications   Medication Sig Start Date End Date Taking? Authorizing Provider  acetaminophen-codeine (TYLENOL #3) 300-30 MG tablet Take 1 tablet by mouth at bedtime as needed for moderate pain. 05/11/21  Yes Newt Minion, MD  albuterol (VENTOLIN HFA) 108 (90 Base) MCG/ACT inhaler Inhale 1-2 puffs into the lungs every 6 (six) hours as needed for wheezing or shortness of breath. 07/02/20  Yes Mariel Aloe, MD  amLODipine (NORVASC) 10 MG tablet TAKE 1 TABLET BY MOUTH EVERY  DAY Patient taking differently: Take 10 mg by mouth daily. 09/10/21  Yes [name], MD  aspirin 81 MG chewable tablet Chew 1 tablet (81 mg total) by mouth daily. 08/07/16  Yes Reino Bellis B, NP  atorvastatin (LIPITOR) 40 MG tablet TAKE 1 TABLET BY MOUTH EVERY DAY Patient taking differently: Take 40 mg by mouth daily. 12/22/20  Yes [name], MD  B Complex-C-Zn-Folic Acid (DIALYVITE/ZINC) TABS Take 1 tablet by mouth daily. 03/25/20  Yes [provider]  calcitRIOL (ROCALTROL) 0.5 MCG capsule Take 1.5 mcg by mouth daily. 09/11/18  Yes [provider]  calcium acetate (PHOSLO) 667 MG capsule Take 667 mg by mouth 3 (three) times daily with meals. 09/23/17  Yes [provider]  clopidogrel (PLAVIX)  75 MG tablet TAKE 1 TABLET (75 MG TOTAL) BY MOUTH DAILY WITH BREAKFAST. 07/28/20  Yes [name], MD  cyclobenzaprine (FLEXERIL) 10 MG tablet Take 1 tablet (10 mg total) by mouth 2 (two) times daily as needed. 08/06/20  Yes Newlin, Enobong, MD  fluticasone furoate-vilanterol (BREO ELLIPTA) 200-25 MCG/ACT AEPB Inhale 1 puff into the lungs daily. 11/05/21  Yes Argentina Donovan, PA-C  gabapentin (NEURONTIN) 300 MG capsule Take 300 mg by mouth 3 (three) times daily. 10/27/21  Yes [provider]  hydrALAZINE (APRESOLINE) 50 MG tablet Take 50 mg by mouth every 8 (eight) hours. 06/18/20  Yes [provider]  ipratropium-albuterol (DUONEB) 0.5-2.5 (3) MG/3ML SOLN Take 3 mLs by nebulization every 6 (six) hours as needed. 07/02/20  Yes Mariel Aloe, MD  lanthanum (FOSRENOL) 1000 MG chewable tablet Chew 1,000 mg by mouth 5 (five) times daily as needed. 10/11/21  Yes [provider]  Methoxy PEG-Epoetin Beta (MIRCERA IJ) Inject into the skin. 07/09/19  Yes [provider]  metoprolol succinate (TOPROL-XL) 50 MG 24 hr tablet TAKE 1.5 TABLETS BY MOUTH DAILY 07/28/20  Yes [name], MD  montelukast (SINGULAIR) 10 MG tablet Take 1 tablet (10 mg total) by mouth at bedtime. 11/05/21  Yes Freeman Caldron M, PA-C  ondansetron (ZOFRAN ODT) 4 MG disintegrating tablet Take 1 tablet (4 mg total) by mouth every 8 (eight) hours as needed for nausea or vomiting. 07/02/20  Yes Mariel Aloe, MD  oxyCODONE-acetaminophen (PERCOCET/ROXICET) 5-325 MG tablet Take 1 tablet by mouth every 8 (eight) hours as needed for severe pain. 08/05/21  Yes Suzan Slick, NP  promethazine (PHENERGAN) 25 MG tablet Take 1 tablet (25 mg total) by mouth every 8 (eight) hours as needed for nausea or vomiting. 07/30/21  Yes Newlin, Enobong, MD  sucralfate (CARAFATE) 1 GM/10ML suspension Take 10 mLs (1 g total) by mouth 4 (four) times daily -  with meals and at bedtime. 03/22/20  Yes Jacqlyn Larsen, PA-C   HYDROcodone-acetaminophen (NORCO) 10-325 MG tablet Take 0.5 tablets by mouth every 8 (eight) hours as needed for severe pain. Patient not taking: Reported on 11/08/2021 05/10/21   Margarita Mail, PA-C  iron sucrose (VENOFER) 20 MG/ML injection Iron Sucrose (Venofer) Patient not taking: Reported on 11/08/2021 07/11/19   [provider]  isosorbide mononitrate (IMDUR) 30 MG 24 hr tablet Take 15 mg by mouth daily as needed (chest pain).    [provider]  Misc. Devices MISC Electric scooter for mobility 06/24/21   Charlott Rakes, MD  nitroGLYCERIN (NITROSTAT) 0.4 MG SL tablet Place 1 tablet (0.4 mg total) under the tongue every 5 (five) minutes as needed for chest pain. 08/06/19   [name],  MD  pantoprazole (PROTONIX) 40 MG tablet Take 1 tablet (40 mg total) by mouth daily. Patient not taking: Reported on 11/08/2021 07/02/20   Mariel Aloe, MD  PARoxetine (PAXIL) 20 MG tablet Take 1 tablet (20 mg total) by mouth daily. For hot flashes Patient not taking: Reported on 11/08/2021 07/30/21   Charlott Rakes, MD  PRALUENT 75 MG/ML SOAJ INJECT 75 MG INTO THE SKIN EVERY 14 (FOURTEEN) DAYS. 02/07/20   [name], MD    Social History   Socioeconomic History   Marital status: Single    Spouse name: Not on file   Number of children: 3   Years of education: 14   Highest education level: Not on file  Occupational History   Occupation: Curator  Tobacco Use   Smoking status: Former    Years: 20.00    Types: Cigarettes    Quit date: 11/2016    Years since quitting: 5.0   Smokeless tobacco: Never  Vaping Use   Vaping Use: Never used  Substance and Sexual Activity   Alcohol use: No    Alcohol/week: 0.0 standard drinks of alcohol   Drug use: Yes    Frequency: 2.0 times per week    Types: Marijuana   Sexual activity: Not on file  Other Topics Concern   Not on file  Social History Narrative   Lives with daughter in a 2 story home.  Has 3 children.   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  Education: college.    Social Determinants of Health   Financial Resource Strain: Not on file  Food Insecurity: No Food Insecurity (08/11/2021)   Hunger Vital Sign    Worried About Running Out of Food in the Last Year: Never true    Ran Out of Food in the Last Year: Never true  Transportation Needs: No Transportation Needs (08/11/2021)   PRAPARE - Hydrologist (Medical): No    Lack of Transportation (Non-Medical): No  Physical Activity: Inactive (10/05/2020)   Exercise Vital Sign    Days of Exercise per Week: 0 days    Minutes of Exercise per Session: 0 min  Stress: Not on file  Social Connections: Unknown (10/05/2020)   Social Connection and Isolation Panel [NHANES]    Frequency of Communication with Friends and Family: More than three times a week    Frequency of Social Gatherings with Friends and Family: More than three times a week    Attends Religious Services: 1 to 4 times per year    Active Member of [employer] or Organizations: No    Attends Archivist Meetings: Never    Marital Status: Not on file  Intimate Partner Violence: Not on file    Family History  Problem Relation Age of Onset   Allergic rhinitis Mother    Hypertension Father    Allergic rhinitis Father    Hypertension Sister    Allergic rhinitis Sister    Hypertension Brother    Allergic rhinitis Brother    Breast cancer Cousin        x2    ROS: Otherwise negative unless mentioned in HPI  Physical Examination  Vitals:   11/14/21 0142 11/14/21 0509  BP: (!) 161/65 (!) 161/64  Pulse: 88 87  Resp: 18 20  Temp: 99.1 F (37.3 C) 98.4 F (36.9 C)  SpO2: 98% 99%   Body mass index is 25.17 kg/m.  General:  WDWN in NAD Gait: Not  observed HENT: WNL, normocephalic Pulmonary: normal non-labored breathin Abdomen: soft, NT/ND, no masses Skin: without rashes Vascular Exam/Pulses: Unable to palpate femoral  pulses due to the patient's positioning.  Stated her hip was in too much pain to lay on her back flat for further assessment.  Nonpalpable pulses in the feet.  No pain in the right foot, pain in the left foot unchanged since Monday. Extremities: without ischemic changes, without Gangrene , without cellulitis; without open wounds;  Musculoskeletal: no muscle wasting or atrophy  Neurologic: A&O X 3;  No focal weakness or paresthesias are detected; speech is fluent/normal Psychiatric:  The pt has Normal affect. Lymph:  Unremarkable  CBC    Component Value Date/Time   WBC 13.0 (H) 11/12/2021 0512   RBC 3.16 (L) 11/12/2021 0512   HGB 7.5 (L) 11/12/2021 0512   HGB 9.2 (L) 07/26/2016 0000   HCT 25.4 (L) 11/12/2021 0512   HCT 30.7 (L) 07/26/2016 0000   PLT 376 11/12/2021 0512   PLT 474 (H) 07/26/2016 0000   MCV 80.4 11/12/2021 0512   MCV 82 07/26/2016 0000   MCH 23.7 (L) 11/12/2021 0512   MCHC 29.5 (L) 11/12/2021 0512   RDW 19.5 (H) 11/12/2021 0512   RDW 13.6 07/26/2016 0000   LYMPHSABS 1.1 06/29/2020 1310   LYMPHSABS 2.9 07/26/2016 0000   MONOABS 0.4 06/29/2020 1310   EOSABS 0.1 06/29/2020 1310   EOSABS 0.1 07/26/2016 0000   BASOSABS 0.0 06/29/2020 1310   BASOSABS 0.0 07/26/2016 0000    BMET    Component Value Date/Time   NA 137 11/14/2021 0205   NA 132 (L) 07/26/2016 0000   K 4.5 11/14/2021 0205   CL 93 (L) 11/14/2021 0205   CO2 25 11/14/2021 0205   GLUCOSE 86 11/14/2021 0205   BUN 24 (H) 11/14/2021 0205   BUN 39 (H) 07/26/2016 0000   CREATININE 3.90 (H) 11/14/2021 0205   CALCIUM 9.0 11/14/2021 0205   CALCIUM 8.4 (L) 06/21/2017 1939   GFRNONAA 13 (L) 11/14/2021 0205   GFRAA 6 (L) 06/08/2019 1506    COAGS: Lab Results  Component Value Date   INR 1.3 (H) 11/09/2021   INR 1.2 11/08/2021   INR 1.1 01/09/2019    ASSESSMENT/PLAN: This is a 51 y.o. female well-known to the vascular surgery service having undergone multiple endovascular interventions for her severe  peripheral arterial disease.  On the left, Sira has undergone EIA stenting, SFA stenting.  Most recent imaging demonstrates SFA stenosis with no waveforms appreciated at the toes bilaterally.  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 Current symptoms are not new, and have been present for nearly a week.  I had a long discussion with her regarding the above.  Unfortunately, with her longstanding history of end-stage renal disease and diabetes with known, severe, arterial disease she is at high risk of amputation.  My assumption is that the left-sided SFA stent may be occluded due to high-grade stenosis and recent pressor requirement at time of admission.  Being that symptoms are not new, my plan is to initiate a heparin drip, and set [place] up for an angiogram early next week.  I will discuss the patient further with Dr. Carlis Abbott, her primary vascular surgeon. I do not think she is as candidate for open surgery at this time.  Barriers to angiogram will be if Kalisha is able to lay flat, which she was unable  to do in the bed this morning due to hip pain.   Please initiate a heparin gtt.   ________  Working doppler located monophasic AT right, PT left.    Cassandria Santee MD [facility] [phone] 11/14/2021  10:48 AM

## 2021-11-14 NOTE — Progress Notes (Signed)
PROGRESS NOTE    Susan Horton  YQI:[id number] DOB: Jul 01, 1970 DOA: 11/08/2021 PCP: Charlott Rakes, MD    Brief Narrative:  51 year old woman, former smoker, with end-stage renal disease on HD, CAD, hypertension with systolic and diastolic CHF, diabetes complicated by peripheral neuropathy and gastroparesis, lower extremity DVT, asthma.  She underwent recent excision of tumoral calcinosis left hip/thigh, drain and sutures removed on 10/20/2021 without any evidence of infection per office notes.    She was brought to the emergency department 10/29 with 3 days of progressive fatigue, weakness.  She apparently suffered a mechanical fall on 10/27 with resultant R hip pain.  In the ED noted to be febrile, lethargic/confused, tachycardic.  Slowly improving.  Main complaint is left foot pain.     Assessment and Plan:  Acute encephalopathy, suspect metabolic Myoclonus -? Medication related -mental status continues to improve off meds  Left foot pain -uric acid normal -x ray without fracture -vascular consult:  left-sided SFA stent may be occluded due to high-grade stenosis and recent pressor requirement at time of admission  -heparin gtt -add lidocaine patch   Severe sepsis -improved shock.  - concerning for possible bacteremia in patient with vascular access, on HD.  - no cultures done -CT Hip with some soft tissue edema which might reflect mild cellulitis. Wound draining-- culture done - NGTD -changed cefepime to zosyn-- uinclear what exactly we are treating as all culture negative- will d/c abx after 7 days    Biventricular heart failure Severe Tricuspid regurg - new Pulmonary hypertension CAD s/p mLAD PCI w DES (2018) PVD  HLD HTN -in 2021 had normal LVEF and g2dd, mild TR. This admission LVEF 45-50, LV global hypokinesis. RV moderately reduced systolic fxn, moderately enlarged RV size, mildly elevated PASP. RVSP 43    P -DAPT  -volume management per RRT  -cards consult:  monitor   Acute on chronic renal failure, ESRD on HD.  Only had a partial HD run on 10/27.   Hyperkalemia Hyperphosphatemia  Uremia -HD 11/3 and again 11/6  Anemia  -trend -transfuse for <7   Asthma without exacerbation  -breo ellipta, PRN duoneb, singulair   DM2/Diabetic neuropathy /Gastroparesis  -SSI -gabapentin- d/c'd   Tumoral calcinosis, recent left thigh/hip surgery -Wound care  -culture the purulent discharge- HGTD -on abx    DVT prophylaxis: heparin injection 5,000 Units Start: 11/11/21 1400    Code Status: Full Code Family Communication: at bedside  Disposition Plan:  Level of care: Progressive Status is: Inpatient Remains inpatient appropriate because: AMS    Consultants:  PCCM renal   Subjective: Room smells of marijuana   Objective: Vitals:   11/13/21 1519 11/13/21 2148 11/14/21 0142 11/14/21 0509  BP: (!) 182/83 (!) 160/61 (!) 161/65 (!) 161/64  Pulse: 95 93 88 87  Resp: '18 18 18 20  '$ Temp:  98.2 F (36.8 C) 99.1 F (37.3 C) 98.4 F (36.9 C)  TempSrc:  Oral Oral Oral  SpO2:  99% 98% 99%  Weight:        Intake/Output Summary (Last 24 hours) at 11/14/2021 1210 Last data filed at 11/13/2021 1430 Gross per 24 hour  Intake --  Output 0 ml  Net 0 ml    Filed Weights   11/11/21 0500 11/11/21 0501 11/13/21 0616  Weight: 68.9 kg 68.9 kg 68.6 kg    Examination:   General: Appearance:     Overweight female in no acute distress     Lungs:      respirations unlabored  Heart:    Normal heart rate. Normal rhythm. No murmurs, rubs, or gallops.   MS:   All extremities are intact.   Neurologic:   Awake, alert       Data Reviewed: I have personally reviewed following labs and imaging studies  CBC: Recent Labs  Lab 11/09/21 0310 11/09/21 0403 11/09/21 1030 11/10/21 0408 11/11/21 0256 11/12/21 0512  WBC 9.6 11.4*  --  13.0* 13.0* 13.0*  HGB 5.3* 6.6* 7.7* 7.4* 7.6* 7.5*  HCT 18.3* 23.1* 25.9* 25.4* 25.9* 25.4*  MCV 79.2* 79.4*   --  80.9 80.4 80.4  PLT 237 278  --  312 327 470   Basic Metabolic Panel: Recent Labs  Lab 11/09/21 0310 11/10/21 0924 11/11/21 0256 11/12/21 0512 11/13/21 0148 11/14/21 0205  NA 137  135 136 137  136 134* 135 137  K 3.9  3.8 4.5 5.2*  5.2* 4.2 4.3 4.5  CL 106  105 103 103  102 94* 94* 93*  CO2 17*  17* 21* 21*  21* '23 25 25  '$ GLUCOSE 72  71 96 137*  137* 84 94 86  BUN 47*  49* 57* 67*  72* 35* 48* 24*  CREATININE 4.18*  4.26* 6.22* 7.62*  7.54* 4.83* 6.05* 3.90*  CALCIUM 7.2*  7.2* 7.7* 8.0*  7.9* 8.4* 8.4* 9.0  MG 1.7  --  2.5*  --   --   --   PHOS 4.5  4.7*  --  9.1*  9.0* 6.4* 7.0* 6.1*   GFR: Estimated Creatinine Clearance: 16.6 mL/min (A) (by C-G formula based on SCr of 3.9 mg/dL (H)). Liver Function Tests: Recent Labs  Lab 11/08/21 0929 11/08/21 1635 11/09/21 0310 11/11/21 0256 11/12/21 0512 11/13/21 0148 11/14/21 0205  AST 21  --  39  --   --   --   --   ALT 9  --  17  --   --   --   --   ALKPHOS 171*  --  160*  --   --   --   --   BILITOT 0.6  --  0.6  --   --   --   --   PROT 7.2  --  4.6*  --   --   --   --   ALBUMIN 2.4*   < > 1.6*  1.6* 2.2* 1.9* 1.8* 1.9*   < > = values in this interval not displayed.   No results for input(s): "LIPASE", "AMYLASE" in the last 168 hours. Recent Labs  Lab 11/11/21 1041  AMMONIA 30   Coagulation Profile: Recent Labs  Lab 11/08/21 1250 11/09/21 0422  INR 1.2 1.3*   Cardiac Enzymes: Recent Labs  Lab 11/11/21 1041  CKTOTAL 38   BNP (last 3 results) No results for input(s): "PROBNP" in the last 8760 hours. HbA1C: No results for input(s): "HGBA1C" in the last 72 hours.  CBG: Recent Labs  Lab 11/13/21 0902 11/13/21 1637 11/13/21 2242 11/14/21 0810 11/14/21 1156  GLUCAP 84 70 81 101* 99   Lipid Profile: No results for input(s): "CHOL", "HDL", "LDLCALC", "TRIG", "CHOLHDL", "LDLDIRECT" in the last 72 hours. Thyroid Function Tests: No results for input(s): "TSH", "T4TOTAL", "FREET4",  "T3FREE", "THYROIDAB" in the last 72 hours. Anemia Panel: No results for input(s): "VITAMINB12", "FOLATE", "FERRITIN", "TIBC", "IRON", "RETICCTPCT" in the last 72 hours.  Sepsis Labs: Recent Labs  Lab 11/08/21 1247 11/08/21 1633 11/08/21 1634 11/08/21 1911 11/08/21 2355 11/09/21 0310 11/10/21 0408 11/11/21 9628 11/12/21 0512 11/13/21  0148  PROCALCITON  --    < >  --   --   --    < > 27.44 25.41 21.71 18.01  LATICACIDVEN 2.0*  --  1.5 2.5* 1.3  --   --   --   --   --    < > = values in this interval not displayed.    Recent Results (from the past 240 hour(s))  SARS Coronavirus 2 by RT PCR (hospital order, performed in [facility] hospital lab) *cepheid single result test* Anterior Nasal Swab     Status: None   Collection Time: 11/08/21  9:04 AM   Specimen: Anterior Nasal Swab  Result Value Ref Range Status   SARS Coronavirus 2 by RT PCR NEGATIVE NEGATIVE Final    Comment: (NOTE) SARS-CoV-2 target nucleic acids are NOT DETECTED.  The SARS-CoV-2 RNA is generally detectable in upper and lower respiratory specimens during the acute phase of infection. The lowest concentration of SARS-CoV-2 viral copies this assay can detect is 250 copies / mL. A negative result does not preclude SARS-CoV-2 infection and should not be used as the sole basis for treatment or other patient management decisions.  A negative result may occur with improper specimen collection / handling, submission of specimen other than nasopharyngeal swab, presence of viral mutation(s) within the areas targeted by this assay, and inadequate number of viral copies (<250 copies / mL). A negative result must be combined with clinical observations, patient history, and epidemiological information.  Fact Sheet for Patients:   https://www.patel.info/  Fact Sheet for Healthcare Providers: https://hall.com/  This test is not yet approved or  cleared by the [place] FDA  and has been authorized for detection and/or diagnosis of SARS-CoV-2 by FDA under an Emergency Use Authorization (EUA).  This EUA will remain in effect (meaning this test can be used) for the duration of the COVID-19 declaration under Section 564(b)(1) of the Act, 21 U.S.C. section 360bbb-3(b)(1), unless the authorization is terminated or revoked sooner.  Performed at [facility], [place] [address], [place], [place] [address]   MRSA Next Gen by PCR, Nasal     Status: None   Collection Time: 11/08/21  3:14 PM   Specimen: Nasal Mucosa; Nasal Swab  Result Value Ref Range Status   MRSA by PCR Next Gen NOT DETECTED NOT DETECTED Final    Comment: (NOTE) The GeneXpert MRSA Assay (FDA approved for NASAL specimens only), is one component of a comprehensive MRSA colonization surveillance program. It is not intended to diagnose MRSA infection nor to guide or monitor treatment for MRSA infections. Test performance is not FDA approved in patients less than 74 years old. Performed at [facility], [place] [address] [place], [place] [address]   Culture, blood (Routine X 2) w Reflex to ID Panel     Status: None (Preliminary result)   Collection Time: 11/12/21 11:47 AM   Specimen: BLOOD RIGHT WRIST  Result Value Ref Range Status   Specimen Description BLOOD RIGHT WRIST  Final   Special Requests   Final    BOTTLES DRAWN AEROBIC AND ANAEROBIC Blood Culture adequate volume   Culture   Final    NO GROWTH 2 DAYS Performed at [facility], [address] [place], [place] [address]    Report Status PENDING  Incomplete  Culture, blood (Routine X 2) w Reflex to ID Panel     Status: None (Preliminary result)   Collection Time: 11/12/21 12:00 PM  Specimen: BLOOD RIGHT WRIST  Result Value Ref Range Status   Specimen Description BLOOD RIGHT WRIST  Final   Special Requests   Final    BOTTLES DRAWN AEROBIC AND ANAEROBIC Blood Culture adequate volume   Culture   Final    NO  GROWTH 2 DAYS Performed at [facility], [address], [place], [place] [address]    Report Status PENDING  Incomplete  Aerobic Culture w Gram Stain (superficial specimen)     Status: None   Collection Time: 11/12/21  2:25 PM   Specimen: Wound  Result Value Ref Range Status   Specimen Description WOUND HIP  Final   Special Requests  LEFT  Final   Gram Stain NO WBC SEEN NO ORGANISMS SEEN   Final   Culture   Final    NO GROWTH 2 DAYS Performed at [facility], [address] [place], [place] [address]    Report Status 11/14/2021 FINAL  Final         Radiology Studies: DG Ankle 2 Views Left  Result Date: 11/13/2021 CLINICAL DATA:  Pain EXAM: LEFT ANKLE - 2 VIEW COMPARISON:  None Available. FINDINGS: No fracture or dislocation is seen. No focal lytic lesions are seen. Extensive arterial calcifications are seen in soft tissues. IMPRESSION: No fracture or dislocation is seen.  Arteriosclerosis. Electronically Signed   By: Elmer Picker M.D.   On: 11/13/2021 18:39        Scheduled Meds:  (feeding supplement) PROSource Plus  30 mL Oral BID BM   aspirin  81 mg Oral Daily   Chlorhexidine Gluconate Cloth  6 each Topical Daily   clopidogrel  75 mg Oral Q breakfast   darbepoetin (ARANESP) injection - NON-DIALYSIS  200 mcg Subcutaneous Q Mon-1800   doxercalciferol  7 mcg Intravenous Q M,W,F-HD   fluticasone furoate-vilanterol  1 puff Inhalation Daily   heparin injection (subcutaneous)  5,000 Units Subcutaneous Q8H   insulin aspart  0-5 Units Subcutaneous QHS   insulin aspart  0-6 Units Subcutaneous TID WC   lanthanum  1,000 mg Oral TID WC   lidocaine  1 patch Transdermal Q24H   metoprolol succinate  25 mg Oral Daily   montelukast  10 mg Oral QHS   pantoprazole (PROTONIX) IV  40 mg Intravenous QHS   thiamine (VITAMIN B1) injection  100 mg Intravenous Daily   Continuous Infusions:  piperacillin-tazobactam (ZOSYN)  IV 2.25 g (11/14/21 0553)     LOS: 6 days     Time spent: 45 minutes spent on chart review, discussion with nursing staff, consultants, updating family and interview/physical exam; more than 50% of that time was spent in counseling and/or coordination of care.    Geradine Girt, DO [facility] Available via Epic secure chat 7am-7pm After these hours, please refer to coverage provider listed on [email or web address] 11/14/2021, 12:10 PM

## 2021-11-14 NOTE — Progress Notes (Signed)
PT Cancellation Note  Patient Details Name: Susan Horton MRN: [id number] DOB: 1971/01/07   Cancelled Treatment:    Reason Eval/Treat Not Completed: Other (comment) (pt experiencing n/v, RN planning to medicate. Will follow up at later date/time as schedule allows and pt able.)   Gwynneth Albright PT, [place] Office [phone]  11/14/21 12:19 PM

## 2021-11-14 NOTE — Progress Notes (Signed)
[facility] Progress Note   Subjective:   Seen in room - more awake/interactive. Denies CP or dyspnea. Says pain was bad last night. Cardiology consulted after what appeared to be new-onset A-fib as well as new severeTR on echo - felt both of those ere likely related to recent sepsis - no change to medications. Otherwise did well with HD yesterday - no UF however.  Objective Vitals:   11/13/21 1519 11/13/21 2148 11/14/21 0142 11/14/21 0509  BP: (!) 182/83 (!) 160/61 (!) 161/65 (!) 161/64  Pulse: 95 93 88 87  Resp: '18 18 18 20  '$ Temp:  98.2 F (36.8 C) 99.1 F (37.3 C) 98.4 F (36.9 C)  TempSrc:  Oral Oral Oral  SpO2:  99% 98% 99%  Weight:       Physical Exam General: Chronically ill appearing woman, NAD. Room air. Heart: RRR; no murmur Lungs: CTA anteriorly Abdomen: soft Extremities: No LE edema; L foot exquisitely tender. L hip with large, clean/dry bandage. Dialysis Access: L AVF + bruit  Additional Objective Labs: Basic Metabolic Panel: Recent Labs  Lab 11/12/21 0512 11/13/21 0148 11/14/21 0205  NA 134* 135 137  K 4.2 4.3 4.5  CL 94* 94* 93*  CO2 '23 25 25  '$ GLUCOSE 84 94 86  BUN 35* 48* 24*  CREATININE 4.83* 6.05* 3.90*  CALCIUM 8.4* 8.4* 9.0  PHOS 6.4* 7.0* 6.1*   Liver Function Tests: Recent Labs  Lab 11/08/21 0929 11/08/21 1635 11/09/21 0310 11/11/21 0256 11/12/21 0512 11/13/21 0148 11/14/21 0205  AST 21  --  39  --   --   --   --   ALT 9  --  17  --   --   --   --   ALKPHOS 171*  --  160*  --   --   --   --   BILITOT 0.6  --  0.6  --   --   --   --   PROT 7.2  --  4.6*  --   --   --   --   ALBUMIN 2.4*   < > 1.6*  1.6*   < > 1.9* 1.8* 1.9*   < > = values in this interval not displayed.   CBC: Recent Labs  Lab 11/09/21 0310 11/09/21 0403 11/09/21 1030 11/10/21 0408 11/11/21 0256 11/12/21 0512  WBC 9.6 11.4*  --  13.0* 13.0* 13.0*  HGB 5.3* 6.6*   < > 7.4* 7.6* 7.5*  HCT 18.3* 23.1*   < > 25.4* 25.9* 25.4*  MCV 79.2*  79.4*  --  80.9 80.4 80.4  PLT 237 278  --  312 327 376   < > = values in this interval not displayed.   Studies/Results: DG Ankle 2 Views Left  Result Date: 11/13/2021 CLINICAL DATA:  Pain EXAM: LEFT ANKLE - 2 VIEW COMPARISON:  None Available. FINDINGS: No fracture or dislocation is seen. No focal lytic lesions are seen. Extensive arterial calcifications are seen in soft tissues. IMPRESSION: No fracture or dislocation is seen.  Arteriosclerosis. Electronically Signed   By: Elmer Picker M.D.   On: 11/13/2021 18:39    Medications:  piperacillin-tazobactam (ZOSYN)  IV 2.25 g (11/14/21 0553)    (feeding supplement) PROSource Plus  30 mL Oral BID BM   aspirin  81 mg Oral Daily   Chlorhexidine Gluconate Cloth  6 each Topical Daily   clopidogrel  75 mg Oral Q breakfast   darbepoetin (ARANESP) injection - NON-DIALYSIS  200  mcg Subcutaneous Q Mon-1800   doxercalciferol  7 mcg Intravenous Q M,W,F-HD   fluticasone furoate-vilanterol  1 puff Inhalation Daily   heparin injection (subcutaneous)  5,000 Units Subcutaneous Q8H   HYDROmorphone       insulin aspart  0-5 Units Subcutaneous QHS   insulin aspart  0-6 Units Subcutaneous TID WC   lanthanum  1,000 mg Oral TID WC   lidocaine  1 patch Transdermal Q24H   metoprolol succinate  25 mg Oral Daily   montelukast  10 mg Oral QHS   pantoprazole (PROTONIX) IV  40 mg Intravenous QHS   thiamine (VITAMIN B1) injection  100 mg Intravenous Daily    Dialysis Orders: MWF [facility] 3h 82mn 400/600  61kg  2/2 bath P2  Hep 4000  LUE AVF - mircera 200 mcg IV q2, last 10/16, due 10/30 - venofer '100mg'$  IV tiw thru 11/06 - doxercalciferol 7 ug tiw - sod thiosulfate 25 gm iv tiw   Assessment/Plan: Septic shock: Febrile - ?hip infection v. HCAP, was on Vanc/Cefepime/Flagyl -> now on Zosyn. ESRD: Required CRRT on admit due to AMS, high BUN, etc. Now back to iHD as of 11/22/21 - next HD Monday 11/6. Still has R IJ temp cath - if remains stable today, can get  it out. Uses L AVF for dialysis. Encephalopathy: D/t shock/?meds - much improved now. Myoclonus: D/t meds most likely - resolved now. Hyperkalemia: On admit, resolved with dialysis. HTN/volume: BP controlled, euvolemic on exam. Anemia of ESRD: Hgb 7.5 - continue max dose Aranesp 2089m weekly while here. Secondary HPTH: CorrCa ok, Phos high - restarted her usual outpatient binder - Fosrenol '1000mg'$  TID.  Nutrition: Alb low, continue supplements. 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 [facility] op note described 1 cup liquid being removed. Per our imaging her, size appears the same which is unfortunate. Would still with ?pus drainage - re-cultured 11/2 - NGTD. T2DM with severe gastroparesis: Ongoing issue, s/p esophageal myotomy 09/17/21. Has issues swallow/keeping pills down. CAD/PAD   Tricuspid regurgitation - monitoring for now per cardiology Irregular atrial tachycardia 11/3 - better today  KaVeneta PentonPA-C 11/14/2021, 9:20 AM  [employer]

## 2021-11-14 NOTE — Progress Notes (Signed)
Pharmacy Antibiotic Note  Susan Horton is a 51 y.o. female admitted on 11/08/2021 presenting with fever and chills, concern for sepsis.  Pharmacy has been consulted for cefepime dosing. Concern for left hip cellulitis at site of left hip tumoral calcinosis surgery a few weeks ago, CT hip did not show bleeding or drainable abscess. ESRD-HD usually MWF. Patient was started on CRRT 10/29, which was subsequently stopped on 10/30 with plan to transition back to iHD. Antibiotics de-escalated 10/30 to cefepime monotherapy. Patient with encephalopathy 11/1, antibiotics changed from cefepime to Zosyn.   11/4: Patient did well with HD on 11/3 with next HD on 11/6. Cultures have been NGTD. Patient has tricuspid regurgitation and cardiology may consider TEE if remains high index of suspicion for tricuspid valve endocarditis  Plan: -Continue Zosyn 2.25 g IV q8h 30 min infusion while HD -Continue to follow clinical progress for dose adjustments and de-escalation -Follow Up length of therapy and cardiology plan for patient   Temp (24hrs), Avg:98.5 F (36.9 C), Min:98.1 F (36.7 C), Max:99.1 F (37.3 C)  Recent Labs  Lab 11/08/21 0930 11/08/21 1247 11/08/21 1634 11/08/21 1635 11/08/21 1911 11/08/21 2355 11/09/21 0310 11/09/21 0403 11/10/21 0408 11/10/21 0924 11/11/21 0256 11/12/21 0512 11/13/21 0148 11/14/21 0205  WBC  --   --   --   --   --   --  9.6 11.4* 13.0*  --  13.0* 13.0*  --   --   CREATININE  --   --   --    < >  --   --  4.18*  4.26*  --   --  6.22* 7.62*  7.54* 4.83* 6.05* 3.90*  LATICACIDVEN 1.7 2.0* 1.5  --  2.5* 1.3  --   --   --   --   --   --   --   --    < > = values in this interval not displayed.     Estimated Creatinine Clearance: 16.6 mL/min (A) (by C-G formula based on SCr of 3.9 mg/dL (H)).    Allergies  Allergen Reactions   Repatha [Evolocumab] Itching   Lisinopril Cough    Antimicrobials this admission:  11/1 Zosyn >> 10/29 Cefepime >> 11/1 10/29  Vancomycin >> 10/30  Microbiology results:  10/29 MRSA PCR: neg 11/2 Bcx: NGTD 11/2 Woud Cx: NGTD  Sandford Craze, PharmD. [facility] Acute Care PGY-1  11/14/2021 9:28 AM

## 2021-11-15 DIAGNOSIS — J189 Pneumonia, unspecified organism: Secondary | ICD-10-CM | POA: Diagnosis not present

## 2021-11-15 DIAGNOSIS — E875 Hyperkalemia: Secondary | ICD-10-CM | POA: Diagnosis not present

## 2021-11-15 DIAGNOSIS — R4182 Altered mental status, unspecified: Secondary | ICD-10-CM | POA: Diagnosis not present

## 2021-11-15 LAB — CBC
HCT: 28 % — ABNORMAL LOW (ref 36.0–46.0)
Hemoglobin: 8.1 g/dL — ABNORMAL LOW (ref 12.0–15.0)
MCH: 23.5 pg — ABNORMAL LOW (ref 26.0–34.0)
MCHC: 28.9 g/dL — ABNORMAL LOW (ref 30.0–36.0)
MCV: 81.2 fL (ref 80.0–100.0)
Platelets: 500 10*3/uL — ABNORMAL HIGH (ref 150–400)
RBC: 3.45 MIL/uL — ABNORMAL LOW (ref 3.87–5.11)
RDW: 19.7 % — ABNORMAL HIGH (ref 11.5–15.5)
WBC: 17 10*3/uL — ABNORMAL HIGH (ref 4.0–10.5)
nRBC: 0 % (ref 0.0–0.2)

## 2021-11-15 LAB — GLUCOSE, CAPILLARY
Glucose-Capillary: 100 mg/dL — ABNORMAL HIGH (ref 70–99)
Glucose-Capillary: 91 mg/dL (ref 70–99)
Glucose-Capillary: 112 mg/dL — ABNORMAL HIGH (ref 70–99)
Glucose-Capillary: 122 mg/dL — ABNORMAL HIGH (ref 70–99)

## 2021-11-15 LAB — RENAL FUNCTION PANEL
Albumin: 1.8 g/dL — ABNORMAL LOW (ref 3.5–5.0)
Anion gap: 16 — ABNORMAL HIGH (ref 5–15)
BUN: 36 mg/dL — ABNORMAL HIGH (ref 6–20)
CO2: 26 mmol/L (ref 22–32)
Calcium: 8.4 mg/dL — ABNORMAL LOW (ref 8.9–10.3)
Chloride: 90 mmol/L — ABNORMAL LOW (ref 98–111)
Creatinine, Ser: 5.47 mg/dL — ABNORMAL HIGH (ref 0.44–1.00)
GFR, Estimated: 9 mL/min — ABNORMAL LOW (ref >60)
Glucose, Bld: 97 mg/dL (ref 70–99)
Phosphorus: 7.2 mg/dL — ABNORMAL HIGH (ref 2.5–4.6)
Potassium: 4.2 mmol/L (ref 3.5–5.1)
Sodium: 132 mmol/L — ABNORMAL LOW (ref 135–145)

## 2021-11-15 LAB — HEPARIN LEVEL (UNFRACTIONATED): Heparin Unfractionated: <0.1 IU/mL — ABNORMAL LOW (ref 0.30–0.70)

## 2021-11-15 LAB — APTT: aPTT: 37 seconds — ABNORMAL HIGH (ref 24–36)

## 2021-11-15 MED ORDER — HEPARIN (PORCINE) 25000 UT/250ML-% IV SOLN
1450.0000 [IU]/h | INTRAVENOUS | Status: DC
  Administered 2021-11-15: 1050 [IU]/h via INTRAVENOUS
  Administered 2021-11-16: 1250 [IU]/h via INTRAVENOUS
  Filled 2021-11-15 (×2): qty 250

## 2021-11-15 MED ORDER — HEPARIN BOLUS VIA INFUSION
1500.0000 [IU] | Freq: Once | INTRAVENOUS | Status: AC
  Administered 2021-11-15: 1500 [IU] via INTRAVENOUS
  Filled 2021-11-15: qty 1500

## 2021-11-15 NOTE — Plan of Care (Signed)

## 2021-11-15 NOTE — Progress Notes (Signed)
ANTICOAGULATION CONSULT NOTE - Initial Consult  Pharmacy Consult for heparin Indication:  high grade SFA stenosis in left foot  Allergies  Allergen Reactions   Repatha [Evolocumab] Itching   Lisinopril Cough    Patient Measurements: Weight: 66.4 kg (146 lb 6.2 oz) Heparin Dosing Weight: 66.4kg  Vital Signs: Temp: 98.1 F (36.7 C) (11/05 0348) Temp Source: Oral (11/05 0348) BP: 135/54 (11/05 0348) Pulse Rate: 87 (11/05 0348)  Labs: Recent Labs    11/13/21 0148 11/14/21 0205 11/15/21 0547  HGB  --   --  8.1*  HCT  --   --  28.0*  PLT  --   --  500*  CREATININE 6.05* 3.90* 5.47*    Estimated Creatinine Clearance: 10.9 mL/min (A) (by C-G formula based on SCr of 5.47 mg/dL (H)).   Medical History: Past Medical History:  Diagnosis Date   Abnormal stress test    Anemia    Angio-edema    Asthma    CAD (coronary artery disease)    DES to mid LAD July 2018, residual moderate RCA disease   Cataract    CKD (chronic kidney disease) stage 4, GFR 15-29 ml/min (HCC)    Dialysis T/Th/Sa   DVT (deep venous thrombosis) ([place])    1996 during pregnancy, 2015 left leg   Eczema    Essential hypertension 12/11/2015   Gastroparesis    GERD (gastroesophageal reflux disease)    Gluteal abscess 12/27/2018   Hidradenitis    Migraine    Neuropathy    Peripheral vascular disease (HCC)    blood clot in leg   Progressive angina ([place]) 06/05/2014   Chest pain   Type 2 diabetes mellitus (HCC)    Type II diabetes mellitus (HCC)    Urticaria     Medications:  Medications Prior to Admission  Medication Sig Dispense Refill Last Dose   acetaminophen-codeine (TYLENOL #3) 300-30 MG tablet Take 1 tablet by mouth at bedtime as needed for moderate pain. 30 tablet 0 11/07/2021   albuterol (VENTOLIN HFA) 108 (90 Base) MCG/ACT inhaler Inhale 1-2 puffs into the lungs every 6 (six) hours as needed for wheezing or shortness of breath. 18 g 2 11/07/2021   amLODipine (NORVASC) 10 MG tablet TAKE 1  TABLET BY MOUTH EVERY DAY (Patient taking differently: Take 10 mg by mouth daily.) 90 tablet 3 11/07/2021   aspirin 81 MG chewable tablet Chew 1 tablet (81 mg total) by mouth daily. 30 tablet 0 11/07/2021   atorvastatin (LIPITOR) 40 MG tablet TAKE 1 TABLET BY MOUTH EVERY DAY (Patient taking differently: Take 40 mg by mouth daily.) 90 tablet 1 11/07/2021   B Complex-C-Zn-Folic Acid (DIALYVITE/ZINC) TABS Take 1 tablet by mouth daily.   Past Week   calcitRIOL (ROCALTROL) 0.5 MCG capsule Take 1.5 mcg by mouth daily.   Past Week   calcium acetate (PHOSLO) 667 MG capsule Take 667 mg by mouth 3 (three) times daily with meals.  10 Past Week   clopidogrel (PLAVIX) 75 MG tablet TAKE 1 TABLET (75 MG TOTAL) BY MOUTH DAILY WITH BREAKFAST. 90 tablet 3 Past Week   cyclobenzaprine (FLEXERIL) 10 MG tablet Take 1 tablet (10 mg total) by mouth 2 (two) times daily as needed. 60 tablet 1 Past Week   fluticasone furoate-vilanterol (BREO ELLIPTA) 200-25 MCG/ACT AEPB Inhale 1 puff into the lungs daily. 60 each 3 Past Week   gabapentin (NEURONTIN) 300 MG capsule Take 300 mg by mouth 3 (three) times daily.   Past Week   hydrALAZINE (APRESOLINE)  50 MG tablet Take 50 mg by mouth every 8 (eight) hours.   Past Week   ipratropium-albuterol (DUONEB) 0.5-2.5 (3) MG/3ML SOLN Take 3 mLs by nebulization every 6 (six) hours as needed. 360 mL 2 Past Week   lanthanum (FOSRENOL) 1000 MG chewable tablet Chew 1,000 mg by mouth 5 (five) times daily as needed.   Past Week   Methoxy PEG-Epoetin Beta (MIRCERA IJ) Inject into the skin.   Past Week   metoprolol succinate (TOPROL-XL) 50 MG 24 hr tablet TAKE 1.5 TABLETS BY MOUTH DAILY 135 tablet 3 Past Week   montelukast (SINGULAIR) 10 MG tablet Take 1 tablet (10 mg total) by mouth at bedtime. 90 tablet 1 Past Week   ondansetron (ZOFRAN ODT) 4 MG disintegrating tablet Take 1 tablet (4 mg total) by mouth every 8 (eight) hours as needed for nausea or vomiting. 20 tablet 0 Past Week    oxyCODONE-acetaminophen (PERCOCET/ROXICET) 5-325 MG tablet Take 1 tablet by mouth every 8 (eight) hours as needed for severe pain. 21 tablet 0 Past Month   promethazine (PHENERGAN) 25 MG tablet Take 1 tablet (25 mg total) by mouth every 8 (eight) hours as needed for nausea or vomiting. 30 tablet 1 Past Month   sucralfate (CARAFATE) 1 GM/10ML suspension Take 10 mLs (1 g total) by mouth 4 (four) times daily -  with meals and at bedtime. 420 mL 0 Past Week   HYDROcodone-acetaminophen (NORCO) 10-325 MG tablet Take 0.5 tablets by mouth every 8 (eight) hours as needed for severe pain. (Patient not taking: Reported on 11/08/2021) 8 tablet 0 Not Taking   iron sucrose (VENOFER) 20 MG/ML injection Iron Sucrose (Venofer) (Patient not taking: Reported on 11/08/2021)   Not Taking   isosorbide mononitrate (IMDUR) 30 MG 24 hr tablet Take 15 mg by mouth daily as needed (chest pain).      Misc. Devices MISC Electric scooter for mobility 1 each 0 unk   nitroGLYCERIN (NITROSTAT) 0.4 MG SL tablet Place 1 tablet (0.4 mg total) under the tongue every 5 (five) minutes as needed for chest pain. 25 tablet 2 unk   pantoprazole (PROTONIX) 40 MG tablet Take 1 tablet (40 mg total) by mouth daily. (Patient not taking: Reported on 11/08/2021) 30 tablet 0 Not Taking   PARoxetine (PAXIL) 20 MG tablet Take 1 tablet (20 mg total) by mouth daily. For hot flashes (Patient not taking: Reported on 11/08/2021) 30 tablet 3 Not Taking   PRALUENT 75 MG/ML SOAJ INJECT 75 MG INTO THE SKIN EVERY 14 (FOURTEEN) DAYS. 2 mL 11 unk   Scheduled:   (feeding supplement) PROSource Plus  30 mL Oral BID BM   aspirin  81 mg Oral Daily   Chlorhexidine Gluconate Cloth  6 each Topical Daily   clopidogrel  75 mg Oral Q breakfast   darbepoetin (ARANESP) injection - NON-DIALYSIS  200 mcg Subcutaneous Q Mon-1800   doxercalciferol  7 mcg Intravenous Q M,W,F-HD   fluticasone furoate-vilanterol  1 puff Inhalation Daily   heparin injection (subcutaneous)  5,000  Units Subcutaneous Q8H   insulin aspart  0-5 Units Subcutaneous QHS   insulin aspart  0-6 Units Subcutaneous TID WC   lanthanum  1,000 mg Oral TID WC   lidocaine  1 patch Transdermal Q24H   metoprolol succinate  25 mg Oral Daily   montelukast  10 mg Oral QHS   pantoprazole (PROTONIX) IV  40 mg Intravenous QHS   thiamine (VITAMIN B1) injection  100 mg Intravenous Daily    Assessment: 51  yoF with PMH of ESRD, T2DM, HTN, CAD, neuropathy was admitted for fatigue and weakness. Patient has a history of left EIA, superficial femoral artery (SFA) stenting with most recent imaging demonstrating high grade SFA stenosis in left foot. Pharmacy consulted per vascular to start heparin drip per occlusion of left sided SFA stent and recent pressor requirement at time of admission, with symptoms not being new.  Hgb 8.1, plts elevated at 500, and no issues reported of signs/symptoms of bleed. Given possible SFA stenosis, will dose based on DVT dosing and normal goal. No prior PTA anticoagulation and has been receiving subq heparin-last dose at 0600AM. No bolus at this time.  Goal of Therapy:  Heparin level 0.3-0.7 units/ml  Monitor platelets by anticoagulation protocol: Yes   Plan:  No bolus due to previous anticoagulation with subq heparin Start heparin infusion at 1050 units/hr (16 units/kg/hr *66.4kg) Check anti-Xa level in 8 hours and daily while on heparin Continue to monitor H&H and platelets  Sandford Craze, PharmD. [facility] Acute Care PGY-1 11/15/2021 9:03 AM

## 2021-11-15 NOTE — Progress Notes (Addendum)
[facility] Progress Note   Subjective:  Eval by VVS yesterday - concern of SFA stenosis causing severe L foot pain, on IV heparin and for LE angiography tomorrow afternoon. No CP or dyspnea today. Ongoing L hip pain.  Objective Vitals:   11/15/21 0150 11/15/21 0348 11/15/21 0400 11/15/21 1028  BP: (!) 129/51 (!) 135/54  (!) 143/61  Pulse: 85 87  81  Resp: '18 18  18  '$ Temp:  98.1 F (36.7 C)  98.8 F (37.1 C)  TempSrc:  Oral  Oral  SpO2: 98% 98%  96%  Weight:  69.2 kg 66.4 kg    Physical Exam General: Chronically ill appearing woman, NAD. Room air. Heart: RRR; no murmur Lungs: CTA anteriorly Abdomen: soft Extremities: No LE edema; L foot tender. L hip with large, clean/dry bandage. Dialysis Access: L AVF + bruit  Additional Objective Labs: Basic Metabolic Panel: Recent Labs  Lab 11/13/21 0148 11/14/21 0205 11/15/21 0547  NA 135 137 132*  K 4.3 4.5 4.2  CL 94* 93* 90*  CO2 '25 25 26  '$ GLUCOSE 94 86 97  BUN 48* 24* 36*  CREATININE 6.05* 3.90* 5.47*  CALCIUM 8.4* 9.0 8.4*  PHOS 7.0* 6.1* 7.2*   Liver Function Tests: Recent Labs  Lab 11/09/21 0310 11/11/21 0256 11/13/21 0148 11/14/21 0205 11/15/21 0547  AST 39  --   --   --   --   ALT 17  --   --   --   --   ALKPHOS 160*  --   --   --   --   BILITOT 0.6  --   --   --   --   PROT 4.6*  --   --   --   --   ALBUMIN 1.6*  1.6*   < > 1.8* 1.9* 1.8*   < > = values in this interval not displayed.   No results for input(s): "LIPASE", "AMYLASE" in the last 168 hours. CBC: Recent Labs  Lab 11/09/21 0403 11/09/21 1030 11/10/21 0408 11/11/21 0256 11/12/21 0512 11/15/21 0547  WBC 11.4*  --  13.0* 13.0* 13.0* 17.0*  HGB 6.6*   < > 7.4* 7.6* 7.5* 8.1*  HCT 23.1*   < > 25.4* 25.9* 25.4* 28.0*  MCV 79.4*  --  80.9 80.4 80.4 81.2  PLT 278  --  312 327 376 500*   < > = values in this interval not displayed.   Blood Culture    Component Value Date/Time   SDES WOUND HIP 11/12/2021 1425    SPECREQUEST  LEFT 11/12/2021 1425   CULT  11/12/2021 1425    NO GROWTH 2 DAYS Performed at [facility], [place] [address], [place], [place] [address]    REPTSTATUS 11/14/2021 FINAL 11/12/2021 1425   Studies/Results: DG Ankle 2 Views Left  Result Date: 11/13/2021 CLINICAL DATA:  Pain EXAM: LEFT ANKLE - 2 VIEW COMPARISON:  None Available. FINDINGS: No fracture or dislocation is seen. No focal lytic lesions are seen. Extensive arterial calcifications are seen in soft tissues. IMPRESSION: No fracture or dislocation is seen.  Arteriosclerosis. Electronically Signed   By: Elmer Picker M.D.   On: 11/13/2021 18:39    Medications:  heparin 1,050 Units/hr (11/15/21 1039)   piperacillin-tazobactam (ZOSYN)  IV 2.25 g (11/15/21 0558)    (feeding supplement) PROSource Plus  30 mL Oral BID BM   aspirin  81 mg Oral Daily   Chlorhexidine Gluconate Cloth  6 each Topical Daily  clopidogrel  75 mg Oral Q breakfast   darbepoetin (ARANESP) injection - NON-DIALYSIS  200 mcg Subcutaneous Q Mon-1800   doxercalciferol  7 mcg Intravenous Q M,W,F-HD   fluticasone furoate-vilanterol  1 puff Inhalation Daily   insulin aspart  0-5 Units Subcutaneous QHS   insulin aspart  0-6 Units Subcutaneous TID WC   lanthanum  1,000 mg Oral TID WC   lidocaine  1 patch Transdermal Q24H   metoprolol succinate  25 mg Oral Daily   montelukast  10 mg Oral QHS   pantoprazole (PROTONIX) IV  40 mg Intravenous QHS   thiamine (VITAMIN B1) injection  100 mg Intravenous Daily    Dialysis Orders: [facility] 3h 39mn 400/600  61kg  2/2 bath P2  Hep 4000  LUE AVF - mircera 200 mcg IV q2, last 10/16, due 10/30 - venofer '100mg'$  IV tiw thru 11/06 - doxercalciferol 7 ug tiw - sod thiosulfate 25 gm iv tiw   Assessment/Plan: Septic shock: Febrile - ?hip infection v. HCAP, was on Vanc/Cefepime/Flagyl -> finishing course of Zosyn. ESRD: Required CRRT on admit due to AMS, high BUN, etc. Now back to iHD as of 11/22/21 - next HD  Monday 11/6. Uses L AVF for dialysis. Encephalopathy: D/t shock/?meds - much improved now. Myoclonus: D/t meds, severe (cefepime, gabapentin and narcotics). Resolved now. Hyperkalemia: On admit, resolved with dialysis. HTN/volume: BP controlled, euvolemic on exam with up per weights, UF as tolerated tomorrow. Anemia of ESRD: Hgb 8.1 - continue max dose Aranesp 2054m weekly while here. Secondary HPTH: CorrCa ok, Phos high - restarted her usual outpatient binder - Fosrenol '1000mg'$  TID.  Nutrition: Alb low, continue supplements. 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 [facility] - op note described 1 cup liquid being removed. Per our imaging her, size appears the same which is unfortunate. Wound still with ?pus drainage - re-cultured 11/2 - NGTD. T2DM with severe gastroparesis: Ongoing issue, s/p esophageal myotomy 09/17/21. Has issues swallow/keeping pills down. CAD/PAD   Tricuspid regurgitation - monitoring for now per cardiology Irregular atrial tachycardia 11/3 - better today. L foot pain; Xray negative, uric acid normal. VVS consulted, now on heparin with plan for LE angiography tomorrow.  KaVeneta PentonPA-C 11/15/2021, 10:56 AM  [facility]  Pt seen, examined and agree w A/P as above.  RoKelly SplinterMD 11/15/2021, 2:38 PM

## 2021-11-15 NOTE — Progress Notes (Signed)
[place] for heparin Indication:  high grade SFA stenosis in left foot  Allergies  Allergen Reactions   Repatha [Evolocumab] Itching   Lisinopril Cough    Patient Measurements: Weight: 66.4 kg (146 lb 6.2 oz) Heparin Dosing Weight: 66.4kg  Vital Signs: Temp: 98.8 F (37.1 C) (11/05 1028) Temp Source: Oral (11/05 1028) BP: 140/53 (11/05 1403) Pulse Rate: 82 (11/05 1403)  Labs: Recent Labs    11/13/21 0148 11/14/21 0205 11/15/21 0547 11/15/21 0950 11/15/21 1800  HGB  --   --  8.1*  --   --   HCT  --   --  28.0*  --   --   PLT  --   --  500*  --   --   APTT  --   --   --  37*  --   HEPARINUNFRC  --   --   --   --  <0.10*  CREATININE 6.05* 3.90* 5.47*  --   --      Estimated Creatinine Clearance: 10.9 mL/min (A) (by C-G formula based on SCr of 5.47 mg/dL (H)).   Medical History: Past Medical History:  Diagnosis Date   Abnormal stress test    Anemia    Angio-edema    Asthma    CAD (coronary artery disease)    DES to mid LAD July 2018, residual moderate RCA disease   Cataract    CKD (chronic kidney disease) stage 4, GFR 15-29 ml/min (HCC)    Dialysis T/Th/Sa   DVT (deep venous thrombosis) ([place])    1996 during pregnancy, 2015 left leg   Eczema    Essential hypertension 12/11/2015   Gastroparesis    GERD (gastroesophageal reflux disease)    Gluteal abscess 12/27/2018   Hidradenitis    Migraine    Neuropathy    Peripheral vascular disease (HCC)    blood clot in leg   Progressive angina ([place]) 06/05/2014   Chest pain   Type 2 diabetes mellitus (HCC)    Type II diabetes mellitus (HCC)    Urticaria     Medications:  Medications Prior to Admission  Medication Sig Dispense Refill Last Dose   acetaminophen-codeine (TYLENOL #3) 300-30 MG tablet Take 1 tablet by mouth at bedtime as needed for moderate pain. 30 tablet 0 11/07/2021   albuterol (VENTOLIN HFA) 108 (90 Base) MCG/ACT inhaler Inhale 1-2 puffs into the lungs  every 6 (six) hours as needed for wheezing or shortness of breath. 18 g 2 11/07/2021   amLODipine (NORVASC) 10 MG tablet TAKE 1 TABLET BY MOUTH EVERY DAY (Patient taking differently: Take 10 mg by mouth daily.) 90 tablet 3 11/07/2021   aspirin 81 MG chewable tablet Chew 1 tablet (81 mg total) by mouth daily. 30 tablet 0 11/07/2021   atorvastatin (LIPITOR) 40 MG tablet TAKE 1 TABLET BY MOUTH EVERY DAY (Patient taking differently: Take 40 mg by mouth daily.) 90 tablet 1 11/07/2021   B Complex-C-Zn-Folic Acid (DIALYVITE/ZINC) TABS Take 1 tablet by mouth daily.   Past Week   calcitRIOL (ROCALTROL) 0.5 MCG capsule Take 1.5 mcg by mouth daily.   Past Week   calcium acetate (PHOSLO) 667 MG capsule Take 667 mg by mouth 3 (three) times daily with meals.  10 Past Week   clopidogrel (PLAVIX) 75 MG tablet TAKE 1 TABLET (75 MG TOTAL) BY MOUTH DAILY WITH BREAKFAST. 90 tablet 3 Past Week   cyclobenzaprine (FLEXERIL) 10 MG tablet Take 1 tablet (10 mg total) by mouth  2 (two) times daily as needed. 60 tablet 1 Past Week   fluticasone furoate-vilanterol (BREO ELLIPTA) 200-25 MCG/ACT AEPB Inhale 1 puff into the lungs daily. 60 each 3 Past Week   gabapentin (NEURONTIN) 300 MG capsule Take 300 mg by mouth 3 (three) times daily.   Past Week   hydrALAZINE (APRESOLINE) 50 MG tablet Take 50 mg by mouth every 8 (eight) hours.   Past Week   ipratropium-albuterol (DUONEB) 0.5-2.5 (3) MG/3ML SOLN Take 3 mLs by nebulization every 6 (six) hours as needed. 360 mL 2 Past Week   lanthanum (FOSRENOL) 1000 MG chewable tablet Chew 1,000 mg by mouth 5 (five) times daily as needed.   Past Week   Methoxy PEG-Epoetin Beta (MIRCERA IJ) Inject into the skin.   Past Week   metoprolol succinate (TOPROL-XL) 50 MG 24 hr tablet TAKE 1.5 TABLETS BY MOUTH DAILY 135 tablet 3 Past Week   montelukast (SINGULAIR) 10 MG tablet Take 1 tablet (10 mg total) by mouth at bedtime. 90 tablet 1 Past Week   ondansetron (ZOFRAN ODT) 4 MG disintegrating tablet Take  1 tablet (4 mg total) by mouth every 8 (eight) hours as needed for nausea or vomiting. 20 tablet 0 Past Week   oxyCODONE-acetaminophen (PERCOCET/ROXICET) 5-325 MG tablet Take 1 tablet by mouth every 8 (eight) hours as needed for severe pain. 21 tablet 0 Past Month   promethazine (PHENERGAN) 25 MG tablet Take 1 tablet (25 mg total) by mouth every 8 (eight) hours as needed for nausea or vomiting. 30 tablet 1 Past Month   sucralfate (CARAFATE) 1 GM/10ML suspension Take 10 mLs (1 g total) by mouth 4 (four) times daily -  with meals and at bedtime. 420 mL 0 Past Week   HYDROcodone-acetaminophen (NORCO) 10-325 MG tablet Take 0.5 tablets by mouth every 8 (eight) hours as needed for severe pain. (Patient not taking: Reported on 11/08/2021) 8 tablet 0 Not Taking   iron sucrose (VENOFER) 20 MG/ML injection Iron Sucrose (Venofer) (Patient not taking: Reported on 11/08/2021)   Not Taking   isosorbide mononitrate (IMDUR) 30 MG 24 hr tablet Take 15 mg by mouth daily as needed (chest pain).      Misc. Devices MISC Electric scooter for mobility 1 each 0 unk   nitroGLYCERIN (NITROSTAT) 0.4 MG SL tablet Place 1 tablet (0.4 mg total) under the tongue every 5 (five) minutes as needed for chest pain. 25 tablet 2 unk   pantoprazole (PROTONIX) 40 MG tablet Take 1 tablet (40 mg total) by mouth daily. (Patient not taking: Reported on 11/08/2021) 30 tablet 0 Not Taking   PARoxetine (PAXIL) 20 MG tablet Take 1 tablet (20 mg total) by mouth daily. For hot flashes (Patient not taking: Reported on 11/08/2021) 30 tablet 3 Not Taking   PRALUENT 75 MG/ML SOAJ INJECT 75 MG INTO THE SKIN EVERY 14 (FOURTEEN) DAYS. 2 mL 11 unk   Scheduled:   (feeding supplement) PROSource Plus  30 mL Oral BID BM   aspirin  81 mg Oral Daily   Chlorhexidine Gluconate Cloth  6 each Topical Daily   clopidogrel  75 mg Oral Q breakfast   darbepoetin (ARANESP) injection - NON-DIALYSIS  200 mcg Subcutaneous Q Mon-1800   doxercalciferol  7 mcg Intravenous Q  M,W,F-HD   fluticasone furoate-vilanterol  1 puff Inhalation Daily   insulin aspart  0-5 Units Subcutaneous QHS   insulin aspart  0-6 Units Subcutaneous TID WC   lanthanum  1,000 mg Oral TID WC   lidocaine  1  patch Transdermal Q24H   metoprolol succinate  25 mg Oral Daily   montelukast  10 mg Oral QHS   pantoprazole (PROTONIX) IV  40 mg Intravenous QHS   thiamine (VITAMIN B1) injection  100 mg Intravenous Daily    Assessment: 65 yoF with PMH of ESRD, T2DM, HTN, CAD, neuropathy was admitted for fatigue and weakness. Patient has a history of left EIA, superficial femoral artery (SFA) stenting with most recent imaging demonstrating high grade SFA stenosis in left foot. Pharmacy consulted per vascular to start heparin drip for occlusion of left sided SFA stent and recent pressor requirement at time of admission, with symptoms not being new.  Hgb 8.1, plts elevated at 500, and no issues reported of signs/symptoms of bleed. Given possible SFA stenosis, will dose based on DVT dosing and normal goal. No prior PTA anticoagulation and has been receiving subq heparin-last dose at 0600AM. No bolus at this time.  *Heparin level this evening is undetectable. No issues with infusion or bleeding per RN.  Goal of Therapy:  Heparin level 0.3-0.7 units/ml  Monitor platelets by anticoagulation protocol: Yes   Plan:  Heparin bolus 1500 units Increase heparin infusion to 1250 units/hr Check anti-Xa level in 8 hours and daily while on heparin Continue to monitor H&H and platelets VVS plans for LE angiography tomorrow.   Thank you for allowing [name] to participate in this patients care. Jens Som, PharmD 11/15/2021 8:08 PM  **Pharmacist phone directory can be found on [email or web address] listed under [place]**

## 2021-11-15 NOTE — Progress Notes (Signed)
PROGRESS NOTE    Susan Horton  QIH:[id number] DOB: December 29, 1970 DOA: 11/08/2021 PCP: Charlott Rakes, MD    Brief Narrative:  51 year old woman, former smoker, with end-stage renal disease on HD, CAD, hypertension with systolic and diastolic CHF, diabetes complicated by peripheral neuropathy and gastroparesis, lower extremity DVT, asthma.  She underwent recent excision of tumoral calcinosis left hip/thigh, drain and sutures removed on 10/20/2021 without any evidence of infection per office notes.    She was brought to the emergency department 10/29 with 3 days of progressive fatigue, weakness.  She apparently suffered a mechanical fall on 10/27 with resultant R hip pain.  In the ED noted to be febrile, lethargic/confused, tachycardic.  Slowly improving.  Main complaint is left foot pain.     Assessment and Plan:  Acute encephalopathy, suspect metabolic Myoclonus -? Medication related -mental status continues to improve off meds  Left foot pain -uric acid normal -x ray without fracture -vascular consult:  left-sided SFA stent may be occluded due to high-grade stenosis and recent pressor requirement at time of admission  -heparin gtt order placed -added lidocaine patch   Severe sepsis -improved shock.  - concerning for possible bacteremia in patient with vascular access, on HD.  - no cultures done -CT Hip with some soft tissue edema which might reflect mild cellulitis. Wound draining-- culture done - NGTD -changed cefepime to zosyn-- uinclear what exactly we are treating as all culture negative- have d/c abx after 7 days    Biventricular heart failure Severe Tricuspid regurg - new Pulmonary hypertension CAD s/p mLAD PCI w DES (2018) PVD  HLD HTN -in 2021 had normal LVEF and g2dd, mild TR. This admission LVEF 45-50, LV global hypokinesis. RV moderately reduced systolic fxn, moderately enlarged RV size, mildly elevated PASP. RVSP 43    P -DAPT  -volume management per RRT   -cards consult: monitor   Acute on chronic renal failure, ESRD on HD.  Only had a partial HD run on 10/27.   Hyperkalemia Hyperphosphatemia  Uremia -HD 11/3 and again 11/6  Anemia  -trend -transfuse for <7   Asthma without exacerbation  -breo ellipta, PRN duoneb, singulair   DM2/Diabetic neuropathy /Gastroparesis  -SSI -gabapentin- d/c'd   Tumoral calcinosis, recent left thigh/hip surgery -Wound care  -culture the purulent discharge- NGTD -on abx    DVT prophylaxis:     Code Status: Full Code Family Communication: at bedside  Disposition Plan:  Level of care: Progressive Status is: Inpatient Remains inpatient appropriate because: needs angiogram  Consultants:  PCCM renal   Subjective: Left foot pain remains-- oxy helps  Objective: Vitals:   11/15/21 0150 11/15/21 0348 11/15/21 0400 11/15/21 1028  BP: (!) 129/51 (!) 135/54  (!) 143/61  Pulse: 85 87  81  Resp: '18 18  18  '$ Temp:  98.1 F (36.7 C)  98.8 F (37.1 C)  TempSrc:  Oral  Oral  SpO2: 98% 98%  96%  Weight:  69.2 kg 66.4 kg    No intake or output data in the 24 hours ending 11/15/21 1128   Filed Weights   11/13/21 0616 11/15/21 0348 11/15/21 0400  Weight: 68.6 kg 69.2 kg 66.4 kg    Examination:    General: Appearance:    Well developed, well nourished female in no acute distress     Lungs:     respirations unlabored  Heart:    Normal heart rate.   MS:   All extremities are intact.   Neurologic:   Awake,  alert       Data Reviewed: I have personally reviewed following labs and imaging studies  CBC: Recent Labs  Lab 11/09/21 0403 11/09/21 1030 11/10/21 0408 11/11/21 0256 11/12/21 0512 11/15/21 0547  WBC 11.4*  --  13.0* 13.0* 13.0* 17.0*  HGB 6.6* 7.7* 7.4* 7.6* 7.5* 8.1*  HCT 23.1* 25.9* 25.4* 25.9* 25.4* 28.0*  MCV 79.4*  --  80.9 80.4 80.4 81.2  PLT 278  --  312 327 376 034*   Basic Metabolic Panel: Recent Labs  Lab 11/09/21 0310 11/10/21 0924 11/11/21 0256  11/12/21 0512 11/13/21 0148 11/14/21 0205 11/15/21 0547  NA 137  135   < > 137  136 134* 135 137 132*  K 3.9  3.8   < > 5.2*  5.2* 4.2 4.3 4.5 4.2  CL 106  105   < > 103  102 94* 94* 93* 90*  CO2 17*  17*   < > 21*  21* '23 25 25 26  '$ GLUCOSE 72  71   < > 137*  137* 84 94 86 97  BUN 47*  49*   < > 67*  72* 35* 48* 24* 36*  CREATININE 4.18*  4.26*   < > 7.62*  7.54* 4.83* 6.05* 3.90* 5.47*  CALCIUM 7.2*  7.2*   < > 8.0*  7.9* 8.4* 8.4* 9.0 8.4*  MG 1.7  --  2.5*  --   --   --   --   PHOS 4.5  4.7*  --  9.1*  9.0* 6.4* 7.0* 6.1* 7.2*   < > = values in this interval not displayed.   GFR: Estimated Creatinine Clearance: 10.9 mL/min (A) (by C-G formula based on SCr of 5.47 mg/dL (H)). Liver Function Tests: Recent Labs  Lab 11/09/21 0310 11/11/21 0256 11/12/21 0512 11/13/21 0148 11/14/21 0205 11/15/21 0547  AST 39  --   --   --   --   --   ALT 17  --   --   --   --   --   ALKPHOS 160*  --   --   --   --   --   BILITOT 0.6  --   --   --   --   --   PROT 4.6*  --   --   --   --   --   ALBUMIN 1.6*  1.6* 2.2* 1.9* 1.8* 1.9* 1.8*   No results for input(s): "LIPASE", "AMYLASE" in the last 168 hours. Recent Labs  Lab 11/11/21 1041  AMMONIA 30   Coagulation Profile: Recent Labs  Lab 11/08/21 1250 11/09/21 0422  INR 1.2 1.3*   Cardiac Enzymes: Recent Labs  Lab 11/11/21 1041  CKTOTAL 38   BNP (last 3 results) No results for input(s): "PROBNP" in the last 8760 hours. HbA1C: No results for input(s): "HGBA1C" in the last 72 hours.  CBG: Recent Labs  Lab 11/14/21 0810 11/14/21 1156 11/14/21 1619 11/14/21 2147 11/15/21 0739  GLUCAP 101* 99 104* 133* 100*   Lipid Profile: No results for input(s): "CHOL", "HDL", "LDLCALC", "TRIG", "CHOLHDL", "LDLDIRECT" in the last 72 hours. Thyroid Function Tests: No results for input(s): "TSH", "T4TOTAL", "FREET4", "T3FREE", "THYROIDAB" in the last 72 hours. Anemia Panel: No results for input(s): "VITAMINB12",  "FOLATE", "FERRITIN", "TIBC", "IRON", "RETICCTPCT" in the last 72 hours.  Sepsis Labs: Recent Labs  Lab 11/08/21 1247 11/08/21 1633 11/08/21 1634 11/08/21 1911 11/08/21 2355 11/09/21 0310 11/10/21 0408 11/11/21 7425 11/12/21 9563 11/13/21 0148  PROCALCITON  --    < >  --   --   --    < > 27.44 25.41 21.71 18.01  LATICACIDVEN 2.0*  --  1.5 2.5* 1.3  --   --   --   --   --    < > = values in this interval not displayed.    Recent Results (from the past 240 hour(s))  SARS Coronavirus 2 by RT PCR (hospital order, performed in [facility] hospital lab) *cepheid single result test* Anterior Nasal Swab     Status: None   Collection Time: 11/08/21  9:04 AM   Specimen: Anterior Nasal Swab  Result Value Ref Range Status   SARS Coronavirus 2 by RT PCR NEGATIVE NEGATIVE Final    Comment: (NOTE) SARS-CoV-2 target nucleic acids are NOT DETECTED.  The SARS-CoV-2 RNA is generally detectable in upper and lower respiratory specimens during the acute phase of infection. The lowest concentration of SARS-CoV-2 viral copies this assay can detect is 250 copies / mL. A negative result does not preclude SARS-CoV-2 infection and should not be used as the sole basis for treatment or other patient management decisions.  A negative result may occur with improper specimen collection / handling, submission of specimen other than nasopharyngeal swab, presence of viral mutation(s) within the areas targeted by this assay, and inadequate number of viral copies (<250 copies / mL). A negative result must be combined with clinical observations, patient history, and epidemiological information.  Fact Sheet for Patients:   https://www.patel.info/  Fact Sheet for Healthcare Providers: https://hall.com/  This test is not yet approved or  cleared by the [place] FDA and has been authorized for detection and/or diagnosis of SARS-CoV-2 by FDA under an Emergency Use  Authorization (EUA).  This EUA will remain in effect (meaning this test can be used) for the duration of the COVID-19 declaration under Section 564(b)(1) of the Act, 21 U.S.C. section 360bbb-3(b)(1), unless the authorization is terminated or revoked sooner.  Performed at [facility], [place] [address], [place], [place] [address]   MRSA Next Gen by PCR, Nasal     Status: None   Collection Time: 11/08/21  3:14 PM   Specimen: Nasal Mucosa; Nasal Swab  Result Value Ref Range Status   MRSA by PCR Next Gen NOT DETECTED NOT DETECTED Final    Comment: (NOTE) The GeneXpert MRSA Assay (FDA approved for NASAL specimens only), is one component of a comprehensive MRSA colonization surveillance program. It is not intended to diagnose MRSA infection nor to guide or monitor treatment for MRSA infections. Test performance is not FDA approved in patients less than 92 years old. Performed at [facility], [place] [address], [place], [place] [address]   Culture, blood (Routine X 2) w Reflex to ID Panel     Status: None (Preliminary result)   Collection Time: 11/12/21 11:47 AM   Specimen: BLOOD RIGHT WRIST  Result Value Ref Range Status   Specimen Description BLOOD RIGHT WRIST  Final   Special Requests   Final    BOTTLES DRAWN AEROBIC AND ANAEROBIC Blood Culture adequate volume   Culture   Final    NO GROWTH 3 DAYS Performed at [facility], [address], [place], [place] [address]    Report Status PENDING  Incomplete  Culture, blood (Routine X 2) w Reflex to ID Panel     Status: None (Preliminary result)   Collection Time: 11/12/21 12:00 PM   Specimen:  BLOOD RIGHT WRIST  Result Value Ref Range Status   Specimen Description BLOOD RIGHT WRIST  Final   Special Requests   Final    BOTTLES DRAWN AEROBIC AND ANAEROBIC Blood Culture adequate volume   Culture   Final    NO GROWTH 3 DAYS Performed at [facility], [address], [place], [place] [address]    Report  Status PENDING  Incomplete  Aerobic Culture w Gram Stain (superficial specimen)     Status: None   Collection Time: 11/12/21  2:25 PM   Specimen: Wound  Result Value Ref Range Status   Specimen Description WOUND HIP  Final   Special Requests  LEFT  Final   Gram Stain NO WBC SEEN NO ORGANISMS SEEN   Final   Culture   Final    NO GROWTH 2 DAYS Performed at [facility], [address] [place], [place] [address]    Report Status 11/14/2021 FINAL  Final         Radiology Studies: DG Ankle 2 Views Left  Result Date: 11/13/2021 CLINICAL DATA:  Pain EXAM: LEFT ANKLE - 2 VIEW COMPARISON:  None Available. FINDINGS: No fracture or dislocation is seen. No focal lytic lesions are seen. Extensive arterial calcifications are seen in soft tissues. IMPRESSION: No fracture or dislocation is seen.  Arteriosclerosis. Electronically Signed   By: Elmer Picker M.D.   On: 11/13/2021 18:39        Scheduled Meds:  (feeding supplement) PROSource Plus  30 mL Oral BID BM   aspirin  81 mg Oral Daily   Chlorhexidine Gluconate Cloth  6 each Topical Daily   clopidogrel  75 mg Oral Q breakfast   darbepoetin (ARANESP) injection - NON-DIALYSIS  200 mcg Subcutaneous Q Mon-1800   doxercalciferol  7 mcg Intravenous Q M,W,F-HD   fluticasone furoate-vilanterol  1 puff Inhalation Daily   insulin aspart  0-5 Units Subcutaneous QHS   insulin aspart  0-6 Units Subcutaneous TID WC   lanthanum  1,000 mg Oral TID WC   lidocaine  1 patch Transdermal Q24H   metoprolol succinate  25 mg Oral Daily   montelukast  10 mg Oral QHS   pantoprazole (PROTONIX) IV  40 mg Intravenous QHS   thiamine (VITAMIN B1) injection  100 mg Intravenous Daily   Continuous Infusions:  heparin 1,050 Units/hr (11/15/21 1039)   piperacillin-tazobactam (ZOSYN)  IV 2.25 g (11/15/21 0558)     LOS: 7 days    Time spent: 45 minutes spent on chart review, discussion with nursing staff, consultants, updating family and  interview/physical exam; more than 50% of that time was spent in counseling and/or coordination of care.    Geradine Girt, DO [facility] Available via Epic secure chat 7am-7pm After these hours, please refer to coverage provider listed on [email or web address] 11/15/2021, 11:28 AM

## 2021-11-15 NOTE — Progress Notes (Signed)
  Daily Progress Note  Subjective: Patient doing well this morning, more alert Less pain in the left foot  Objective: Vitals:   11/15/21 0348 11/15/21 1028  BP: (!) 135/54 (!) 143/61  Pulse: 87 81  Resp: 18 18  Temp: 98.1 F (36.7 C) 98.8 F (37.1 C)  SpO2: 98% 96%    Physical Examination Monophasic left PT signal Foot drop appreciated, minimal motor in the foot which has been present since last Monday.  ASSESSMENT/PLAN:   After discussing the risk and benefits of left lower extremity angiography in an effort to define and improve perfusion, Susan Horton elected to proceed. She is aware that with her chronic level of ischemia, she may require amputation if we are unable to revascularize her left leg.  Plan for Cath Lab tomorrow with Dr. [name]. Please make n.p.o. midnight Recent hip surgery, not a lytic candidate    Cassandria Santee MD MS [facility] [phone] 11/15/2021  1:26 PM

## 2021-11-16 ENCOUNTER — Encounter (HOSPITAL_COMMUNITY)
Admission: EM | Disposition: A | Discharge status: Other Acute Inpatient Hospital | Payer: Self-pay | Source: Home / Self Care | Attending: Internal Medicine

## 2021-11-16 DIAGNOSIS — R4182 Altered mental status, unspecified: Secondary | ICD-10-CM | POA: Diagnosis not present

## 2021-11-16 DIAGNOSIS — E875 Hyperkalemia: Secondary | ICD-10-CM | POA: Diagnosis not present

## 2021-11-16 DIAGNOSIS — I70222 Atherosclerosis of native arteries of extremities with rest pain, left leg: Secondary | ICD-10-CM

## 2021-11-16 DIAGNOSIS — A419 Sepsis, unspecified organism: Secondary | ICD-10-CM | POA: Diagnosis not present

## 2021-11-16 DIAGNOSIS — L8990 Pressure ulcer of unspecified site, unspecified stage: Secondary | ICD-10-CM | POA: Insufficient documentation

## 2021-11-16 HISTORY — PX: ABDOMINAL AORTOGRAM W/LOWER EXTREMITY: CATH118223

## 2021-11-16 LAB — BASIC METABOLIC PANEL
Anion gap: 20 — ABNORMAL HIGH (ref 5–15)
BUN: 46 mg/dL — ABNORMAL HIGH (ref 6–20)
CO2: 23 mmol/L (ref 22–32)
Calcium: 8.7 mg/dL — ABNORMAL LOW (ref 8.9–10.3)
Chloride: 92 mmol/L — ABNORMAL LOW (ref 98–111)
Creatinine, Ser: 6.6 mg/dL — ABNORMAL HIGH (ref 0.44–1.00)
GFR, Estimated: 7 mL/min — ABNORMAL LOW (ref >60)
Glucose, Bld: 94 mg/dL (ref 70–99)
Potassium: 4.3 mmol/L (ref 3.5–5.1)
Sodium: 135 mmol/L (ref 135–145)

## 2021-11-16 LAB — RENAL FUNCTION PANEL
Albumin: 1.8 g/dL — ABNORMAL LOW (ref 3.5–5.0)
Anion gap: 17 — ABNORMAL HIGH (ref 5–15)
BUN: 46 mg/dL — ABNORMAL HIGH (ref 6–20)
CO2: 24 mmol/L (ref 22–32)
Calcium: 8.7 mg/dL — ABNORMAL LOW (ref 8.9–10.3)
Chloride: 95 mmol/L — ABNORMAL LOW (ref 98–111)
Creatinine, Ser: 6.6 mg/dL — ABNORMAL HIGH (ref 0.44–1.00)
GFR, Estimated: 7 mL/min — ABNORMAL LOW (ref >60)
Glucose, Bld: 94 mg/dL (ref 70–99)
Phosphorus: 8 mg/dL — ABNORMAL HIGH (ref 2.5–4.6)
Potassium: 4.4 mmol/L (ref 3.5–5.1)
Sodium: 136 mmol/L (ref 135–145)

## 2021-11-16 LAB — CBC
HCT: 28.4 % — ABNORMAL LOW (ref 36.0–46.0)
Hemoglobin: 8.2 g/dL — ABNORMAL LOW (ref 12.0–15.0)
MCH: 23.8 pg — ABNORMAL LOW (ref 26.0–34.0)
MCHC: 28.9 g/dL — ABNORMAL LOW (ref 30.0–36.0)
MCV: 82.3 fL (ref 80.0–100.0)
Platelets: 512 10*3/uL — ABNORMAL HIGH (ref 150–400)
RBC: 3.45 MIL/uL — ABNORMAL LOW (ref 3.87–5.11)
RDW: 20 % — ABNORMAL HIGH (ref 11.5–15.5)
WBC: 17.3 10*3/uL — ABNORMAL HIGH (ref 4.0–10.5)
nRBC: 0 % (ref 0.0–0.2)

## 2021-11-16 LAB — GLUCOSE, CAPILLARY
Glucose-Capillary: 86 mg/dL (ref 70–99)
Glucose-Capillary: 89 mg/dL (ref 70–99)

## 2021-11-16 LAB — POCT ACTIVATED CLOTTING TIME: Activated Clotting Time: 257 seconds

## 2021-11-16 LAB — HEPARIN LEVEL (UNFRACTIONATED): Heparin Unfractionated: <0.1 IU/mL — ABNORMAL LOW (ref 0.30–0.70)

## 2021-11-16 SURGERY — ABDOMINAL AORTOGRAM W/LOWER EXTREMITY
Anesthesia: LOCAL

## 2021-11-16 MED ORDER — LABETALOL HCL 5 MG/ML IV SOLN: 10.0000 mg | INTRAVENOUS | Status: DC | PRN

## 2021-11-16 MED ORDER — MIDAZOLAM HCL 2 MG/2ML IJ SOLN
INTRAMUSCULAR | Status: AC
  Filled 2021-11-16: qty 2

## 2021-11-16 MED ORDER — HEPARIN SODIUM (PORCINE) 5000 UNIT/ML IJ SOLN
5000.0000 [IU] | Freq: Three times a day (TID) | INTRAMUSCULAR | Status: DC
  Administered 2021-11-16 – 2021-11-23 (×19): 5000 [IU] via SUBCUTANEOUS
  Filled 2021-11-16 (×19): qty 1

## 2021-11-16 MED ORDER — HEPARIN SODIUM (PORCINE) 1000 UNIT/ML IJ SOLN
INTRAMUSCULAR | Status: AC
  Filled 2021-11-16: qty 10

## 2021-11-16 MED ORDER — LIDOCAINE HCL (PF) 1 % IJ SOLN
INTRAMUSCULAR | Status: AC
  Filled 2021-11-16: qty 30

## 2021-11-16 MED ORDER — HEPARIN (PORCINE) IN NACL 1000-0.9 UT/500ML-% IV SOLN
INTRAVENOUS | Status: DC | PRN
  Administered 2021-11-16 (×2): 500 mL

## 2021-11-16 MED ORDER — FENTANYL CITRATE (PF) 100 MCG/2ML IJ SOLN
INTRAMUSCULAR | Status: AC
  Filled 2021-11-16: qty 2

## 2021-11-16 MED ORDER — ACETAMINOPHEN 325 MG PO TABS: 650.0000 mg | ORAL_TABLET | ORAL | Status: DC | PRN

## 2021-11-16 MED ORDER — HYDROMORPHONE HCL 1 MG/ML IJ SOLN
0.5000 mg | INTRAMUSCULAR | Status: AC
  Administered 2021-11-16: 0.5 mg via INTRAVENOUS
  Filled 2021-11-16: qty 1

## 2021-11-16 MED ORDER — HEPARIN (PORCINE) IN NACL 1000-0.9 UT/500ML-% IV SOLN
INTRAVENOUS | Status: AC
  Filled 2021-11-16: qty 1000

## 2021-11-16 MED ORDER — HEPARIN BOLUS VIA INFUSION
1500.0000 [IU] | Freq: Once | INTRAVENOUS | Status: AC
  Administered 2021-11-16: 1500 [IU] via INTRAVENOUS
  Filled 2021-11-16: qty 1500

## 2021-11-16 MED ORDER — HYDROMORPHONE HCL 1 MG/ML IJ SOLN
0.5000 mg | Freq: Once | INTRAMUSCULAR | Status: AC
  Administered 2021-11-16: 0.5 mg via INTRAVENOUS
  Filled 2021-11-16: qty 1

## 2021-11-16 MED ORDER — SODIUM CHLORIDE 0.9% FLUSH
3.0000 mL | Freq: Two times a day (BID) | INTRAVENOUS | Status: DC
  Administered 2021-11-16 – 2021-11-20 (×9): 3 mL via INTRAVENOUS

## 2021-11-16 MED ORDER — SODIUM CHLORIDE 0.9 % IV SOLN: 250.0000 mL | INTRAVENOUS | Status: DC | PRN

## 2021-11-16 MED ORDER — ONDANSETRON HCL 4 MG/2ML IJ SOLN: 4.0000 mg | Freq: Four times a day (QID) | INTRAMUSCULAR | Status: DC | PRN

## 2021-11-16 MED ORDER — MIDAZOLAM HCL 2 MG/2ML IJ SOLN
INTRAMUSCULAR | Status: DC | PRN
  Administered 2021-11-16 (×2): 1 mg via INTRAVENOUS

## 2021-11-16 MED ORDER — HYDRALAZINE HCL 20 MG/ML IJ SOLN: 5.0000 mg | INTRAMUSCULAR | Status: DC | PRN

## 2021-11-16 MED ORDER — LIDOCAINE HCL (PF) 1 % IJ SOLN
INTRAMUSCULAR | Status: DC | PRN
  Administered 2021-11-16: 10 mL via SUBCUTANEOUS

## 2021-11-16 MED ORDER — FENTANYL CITRATE (PF) 100 MCG/2ML IJ SOLN
INTRAMUSCULAR | Status: DC | PRN
  Administered 2021-11-16 (×2): 100 ug via INTRAVENOUS

## 2021-11-16 MED ORDER — HEPARIN SODIUM (PORCINE) 1000 UNIT/ML IJ SOLN
INTRAMUSCULAR | Status: DC | PRN
  Administered 2021-11-16: 4000 [IU] via INTRAVENOUS
  Administered 2021-11-16: 8000 [IU] via INTRAVENOUS

## 2021-11-16 MED ORDER — SODIUM CHLORIDE 0.9% FLUSH: 3.0000 mL | INTRAVENOUS | Status: DC | PRN

## 2021-11-16 MED ORDER — IODIXANOL 320 MG/ML IV SOLN
INTRAVENOUS | Status: DC | PRN
  Administered 2021-11-16: 130 mL via INTRA_ARTERIAL

## 2021-11-16 SURGICAL SUPPLY — 34 items
BALLN STERLING OTW 5X220X150 (BALLOONS) ×1
BALLN STERLING OTW 5X80X135 (BALLOONS) ×1
BALLN STERLING OTW 6X40X135 (BALLOONS) ×1
BALLOON STERLING OTW 5X220X150 (BALLOONS) IMPLANT
BALLOON STERLING OTW 5X80X135 (BALLOONS) IMPLANT
BALLOON STERLING OTW 6X40X135 (BALLOONS) IMPLANT
BUR JETSTREAM XC 2.4/3.4 (BURR) IMPLANT
BURR JETSTREAM XC 2.4/3.4 (BURR) ×1
CATH ANGIO 5F BER2 65CM (CATHETERS) IMPLANT
CATH CROSS OVER TEMPO 5F (CATHETERS) IMPLANT
CATH NAVICROSS ST .035X135CM (MICROCATHETER) IMPLANT
CATH OMNI FLUSH 5F 65CM (CATHETERS) IMPLANT
CATH QUICKCROSS SUPP .035X90CM (MICROCATHETER) IMPLANT
DCB RANGER 6.0X40 135 (BALLOONS) IMPLANT
DEVICE EMBOSHIELD NAV6 4.0-7.0 (FILTER) IMPLANT
GLIDEWIRE ADV .035X260CM (WIRE) IMPLANT
KIT ENCORE 26 ADVANTAGE (KITS) IMPLANT
KIT MICROPUNCTURE NIT STIFF (SHEATH) IMPLANT
KIT PV (KITS) ×1 IMPLANT
RANGER DCB 6.0X40 135 (BALLOONS) ×1
SHEATH CATAPULT 7FR 45 (SHEATH) IMPLANT
SHEATH PINNACLE 5F 10CM (SHEATH) IMPLANT
SHEATH PINNACLE 7F 10CM (SHEATH) IMPLANT
SHEATH PROBE COVER 6X72 (BAG) IMPLANT
STENT VIABAHN 7X29X80 VBX (Permanent Stent) IMPLANT
STENT VIABAHN VBX 7X59X80 (Permanent Stent) IMPLANT
STENT ZILVER PTX 5X140 (STENTS) IMPLANT
STENT ZILVER PTX 5X80 (STENTS) IMPLANT
SYR MEDRAD MARK V 150ML (SYRINGE) IMPLANT
TRANSDUCER W/STOPCOCK (MISCELLANEOUS) ×1 IMPLANT
TRAY PV CATH (CUSTOM PROCEDURE TRAY) ×1 IMPLANT
WIRE AMPLATZ SS-J .035X260CM (WIRE) IMPLANT
WIRE BENTSON .035X145CM (WIRE) IMPLANT
WIRE SPARTACORE .014X300CM (WIRE) IMPLANT

## 2021-11-16 NOTE — Procedures (Signed)
Patient seen on Hemodialysis. BP (!) 144/70 (BP Location: Right Arm)   Pulse 94   Temp 97.9 F (36.6 C) (Oral)   Resp 12   Wt 66.8 kg   SpO2 99%   BMI 24.51 kg/m   QB 350, UF goal 2.5L Tolerating treatment with complaints of LLE pain at this time.   Elmarie Shiley MD [facility]. Office # [phone] Pager # [phone] 10:34 AM

## 2021-11-16 NOTE — Progress Notes (Signed)
Susan Horton for heparin Indication:  high grade SFA stenosis in left foot  Allergies  Allergen Reactions   Repatha [Evolocumab] Itching   Lisinopril Cough    Patient Measurements: Weight: 66.8 kg (147 lb 4.3 oz) Heparin Dosing Weight: 66.4kg  Vital Signs: Temp: 97.9 F (36.6 C) (11/06 0748) Temp Source: Oral (11/06 0748) BP: 144/70 (11/06 1000) Pulse Rate: 95 (11/06 1000)  Labs: Recent Labs    11/15/21 0547 11/15/21 0950 11/15/21 1800 11/16/21 0500 11/16/21 0550  HGB 8.1*  --   --  8.2*  --   HCT 28.0*  --   --  28.4*  --   PLT 500*  --   --  512*  --   APTT  --  37*  --   --   --   HEPARINUNFRC  --   --  <0.10*  --  <0.10*  CREATININE 5.47*  --   --  6.60* 6.60*     Estimated Creatinine Clearance: 9.1 mL/min (A) (by C-G formula based on SCr of 6.6 mg/dL (H)).   Medical History: Past Medical History:  Diagnosis Date   Abnormal stress test    Anemia    Angio-edema    Asthma    CAD (coronary artery disease)    DES to mid LAD July 2018, residual moderate RCA disease   Cataract    CKD (chronic kidney disease) stage 4, GFR 15-29 ml/min (HCC)    Dialysis T/Th/Sa   DVT (deep venous thrombosis) ([place])    1996 during pregnancy, 2015 left leg   Eczema    Essential hypertension 12/11/2015   Gastroparesis    GERD (gastroesophageal reflux disease)    Gluteal abscess 12/27/2018   Hidradenitis    Migraine    Neuropathy    Peripheral vascular disease (HCC)    blood clot in leg   Progressive angina ([place]) 06/05/2014   Chest pain   Type 2 diabetes mellitus (HCC)    Type II diabetes mellitus (HCC)    Urticaria     Medications:  Medications Prior to Admission  Medication Sig Dispense Refill Last Dose   acetaminophen-codeine (TYLENOL #3) 300-30 MG tablet Take 1 tablet by mouth at bedtime as needed for moderate pain. 30 tablet 0 11/07/2021   albuterol (VENTOLIN HFA) 108 (90 Base) MCG/ACT inhaler Inhale 1-2 puffs into the lungs  every 6 (six) hours as needed for wheezing or shortness of breath. 18 g 2 11/07/2021   amLODipine (NORVASC) 10 MG tablet TAKE 1 TABLET BY MOUTH EVERY DAY (Patient taking differently: Take 10 mg by mouth daily.) 90 tablet 3 11/07/2021   aspirin 81 MG chewable tablet Chew 1 tablet (81 mg total) by mouth daily. 30 tablet 0 11/07/2021   atorvastatin (LIPITOR) 40 MG tablet TAKE 1 TABLET BY MOUTH EVERY DAY (Patient taking differently: Take 40 mg by mouth daily.) 90 tablet 1 11/07/2021   B Complex-C-Zn-Folic Acid (DIALYVITE/ZINC) TABS Take 1 tablet by mouth daily.   Past Week   calcitRIOL (ROCALTROL) 0.5 MCG capsule Take 1.5 mcg by mouth daily.   Past Week   calcium acetate (PHOSLO) 667 MG capsule Take 667 mg by mouth 3 (three) times daily with meals.  10 Past Week   clopidogrel (PLAVIX) 75 MG tablet TAKE 1 TABLET (75 MG TOTAL) BY MOUTH DAILY WITH BREAKFAST. 90 tablet 3 Past Week   cyclobenzaprine (FLEXERIL) 10 MG tablet Take 1 tablet (10 mg total) by mouth 2 (two) times daily as needed. 60 tablet  1 Past Week   fluticasone furoate-vilanterol (BREO ELLIPTA) 200-25 MCG/ACT AEPB Inhale 1 puff into the lungs daily. 60 each 3 Past Week   gabapentin (NEURONTIN) 300 MG capsule Take 300 mg by mouth 3 (three) times daily.   Past Week   hydrALAZINE (APRESOLINE) 50 MG tablet Take 50 mg by mouth every 8 (eight) hours.   Past Week   ipratropium-albuterol (DUONEB) 0.5-2.5 (3) MG/3ML SOLN Take 3 mLs by nebulization every 6 (six) hours as needed. 360 mL 2 Past Week   lanthanum (FOSRENOL) 1000 MG chewable tablet Chew 1,000 mg by mouth 5 (five) times daily as needed.   Past Week   Methoxy PEG-Epoetin Beta (MIRCERA IJ) Inject into the skin.   Past Week   metoprolol succinate (TOPROL-XL) 50 MG 24 hr tablet TAKE 1.5 TABLETS BY MOUTH DAILY 135 tablet 3 Past Week   montelukast (SINGULAIR) 10 MG tablet Take 1 tablet (10 mg total) by mouth at bedtime. 90 tablet 1 Past Week   ondansetron (ZOFRAN ODT) 4 MG disintegrating tablet Take  1 tablet (4 mg total) by mouth every 8 (eight) hours as needed for nausea or vomiting. 20 tablet 0 Past Week   oxyCODONE-acetaminophen (PERCOCET/ROXICET) 5-325 MG tablet Take 1 tablet by mouth every 8 (eight) hours as needed for severe pain. 21 tablet 0 Past Month   promethazine (PHENERGAN) 25 MG tablet Take 1 tablet (25 mg total) by mouth every 8 (eight) hours as needed for nausea or vomiting. 30 tablet 1 Past Month   sucralfate (CARAFATE) 1 GM/10ML suspension Take 10 mLs (1 g total) by mouth 4 (four) times daily -  with meals and at bedtime. 420 mL 0 Past Week   HYDROcodone-acetaminophen (NORCO) 10-325 MG tablet Take 0.5 tablets by mouth every 8 (eight) hours as needed for severe pain. (Patient not taking: Reported on 11/08/2021) 8 tablet 0 Not Taking   iron sucrose (VENOFER) 20 MG/ML injection Iron Sucrose (Venofer) (Patient not taking: Reported on 11/08/2021)   Not Taking   isosorbide mononitrate (IMDUR) 30 MG 24 hr tablet Take 15 mg by mouth daily as needed (chest pain).      Misc. Devices MISC Electric scooter for mobility 1 each 0 unk   nitroGLYCERIN (NITROSTAT) 0.4 MG SL tablet Place 1 tablet (0.4 mg total) under the tongue every 5 (five) minutes as needed for chest pain. 25 tablet 2 unk   pantoprazole (PROTONIX) 40 MG tablet Take 1 tablet (40 mg total) by mouth daily. (Patient not taking: Reported on 11/08/2021) 30 tablet 0 Not Taking   PARoxetine (PAXIL) 20 MG tablet Take 1 tablet (20 mg total) by mouth daily. For hot flashes (Patient not taking: Reported on 11/08/2021) 30 tablet 3 Not Taking   PRALUENT 75 MG/ML SOAJ INJECT 75 MG INTO THE SKIN EVERY 14 (FOURTEEN) DAYS. 2 mL 11 unk   Scheduled:   (feeding supplement) PROSource Plus  30 mL Oral BID BM   aspirin  81 mg Oral Daily   Chlorhexidine Gluconate Cloth  6 each Topical Daily   clopidogrel  75 mg Oral Q breakfast   darbepoetin (ARANESP) injection - NON-DIALYSIS  200 mcg Subcutaneous Q Mon-1800   doxercalciferol  7 mcg Intravenous Q  M,W,F-HD   fluticasone furoate-vilanterol  1 puff Inhalation Daily   insulin aspart  0-5 Units Subcutaneous QHS   insulin aspart  0-6 Units Subcutaneous TID WC   lanthanum  1,000 mg Oral TID WC   lidocaine  1 patch Transdermal Q24H   metoprolol succinate  25 mg Oral Daily   montelukast  10 mg Oral QHS   pantoprazole (PROTONIX) IV  40 mg Intravenous QHS   thiamine (VITAMIN B1) injection  100 mg Intravenous Daily    Assessment: 43 yoF with PMH of ESRD, T2DM, HTN, CAD, neuropathy was admitted for fatigue and weakness. Patient has a history of left EIA, superficial femoral artery (SFA) stenting with most recent imaging demonstrating high grade SFA stenosis in left foot. Pharmacy consulted per vascular to start heparin drip for occlusion of left sided SFA stent and recent pressor requirement at time of admission, with symptoms not being new.  Hgb 8.2, plts elevated at 500s, and no issues reported of signs/symptoms of bleed. Given possible SFA stenosis, will dose based on DVT dosing and normal goal. No prior PTA anticoagulation.   *Heparin level this morning is undetectable. No issues with infusion or bleeding noted.   Goal of Therapy:  Heparin level 0.3-0.7 units/ml  Monitor platelets by anticoagulation protocol: Yes   Plan:  Heparin bolus 1500 units Increase heparin infusion to 1450 units/hr Check anti-Xa level in 8 hours and daily while on heparin Continue to monitor H&H and platelets VVS plans for LE angiography today.   Thank you for allowing [name] to participate in this patients care.  Erin Hearing PharmD., BCPS Clinical Pharmacist 11/16/2021 10:19 AM

## 2021-11-16 NOTE — Progress Notes (Signed)
OT Cancellation Note  Patient Details Name: Susan Horton MRN: [id number] DOB: 31-Jul-1970   Cancelled Treatment:    Reason Eval/Treat Not Completed: Patient at procedure or test/ unavailable (Pt currently receiving HD and unavailable to participate in OT treatment. Will re-attempt when able.)  Ailene Ravel, OTR/L,CBIS  Supplemental OT - [place] [place] and [facility]  11/16/2021, 8:50 AM

## 2021-11-16 NOTE — Progress Notes (Signed)
[facility]  Subjective  -having pain in her left foot.  Seen in dialysis.   Objective (!) 155/68 87 97.9 F (36.6 C) (Oral) 20 97%  Intake/Output Summary (Last 24 hours) at 11/16/2021 0800 Last data filed at 11/16/2021 0645 Gross per 24 hour  Intake 576.93 ml  Output 1 ml  Net 575.93 ml    Left femoral pulse palpable Left foot drop that is new but foot is viable  Laboratory Lab Results: Recent Labs    11/15/21 0547 11/16/21 0500  WBC 17.0* 17.3*  HGB 8.1* 8.2*  HCT 28.0* 28.4*  PLT 500* 512*   BMET Recent Labs    11/16/21 0500 11/16/21 0550  NA 135 136  K 4.3 4.4  CL 92* 95*  CO2 23 24  GLUCOSE 94 94  BUN 46* 46*  CREATININE 6.60* 6.60*  CALCIUM 8.7* 8.7*    COAG Lab Results  Component Value Date   INR 1.3 (H) 11/09/2021   INR 1.2 11/08/2021   INR 1.1 01/09/2019   No results found for: "PTT"  Assessment/Planning:  Plan aortogram with lower extremity arteriogram with possible intervention with a focus on the left leg.  She had left hip surgery about 5 weeks ago for tumoral calcinosis at [facility] and was admitted to [facility] with sepsis last week and on pressors.  New rest pain in the left foot over the last week while she was critically ill.  Plan aortogram with lower extremity arteriogram with a focus on the left leg by Dr. [name] today.  [name] 11/16/2021 8:00 AM --

## 2021-11-16 NOTE — Progress Notes (Signed)
PROGRESS NOTE    Susan Horton  NLG:[id number] DOB: 1970-11-10 DOA: 11/08/2021 PCP: Charlott Rakes, MD    Brief Narrative:  51 year old woman, former smoker, with end-stage renal disease on HD, CAD, hypertension with systolic and diastolic CHF, diabetes complicated by peripheral neuropathy and gastroparesis, lower extremity DVT, asthma.  She underwent recent excision of tumoral calcinosis left hip/thigh, drain and sutures removed on 10/20/2021 without any evidence of infection per office notes.    She was brought to the emergency department 10/29 with 3 days of progressive fatigue, weakness.  She apparently suffered a mechanical fall on 10/27 with resultant R hip pain.  In the ED noted to be febrile, lethargic/confused, tachycardic.  Slowly improving in mentation.   Main complaint is left foot pain.     Assessment and Plan:  Acute encephalopathy, suspect metabolic Myoclonus -? Medication related -resolved  Left foot pain -uric acid normal -x ray without fracture -vascular consult:  left-sided SFA stent may be occluded due to high-grade stenosis and recent pressor requirement at time of admission  -heparin gtt order placed -added lidocaine patch -angiogram planned for 11/6   Severe sepsis -improved shock.  - concerning for possible bacteremia in patient with vascular access, on HD.  - no cultures done iniailly but later cultures NGTD -CT Hip with some soft tissue edema which might reflect mild cellulitis. Wound draining-- culture done - NGTD -changed cefepime to zosyn-- uinclear what exactly we are treating as all culture negative- have d/c 'd abx after 7 days-- monitor off    Biventricular heart failure Severe Tricuspid regurg - new Pulmonary hypertension CAD s/p mLAD PCI w DES (2018) PVD  HLD HTN -in 2021 had normal LVEF and g2dd, mild TR. This admission LVEF 45-50, LV global hypokinesis. RV moderately reduced systolic fxn, moderately enlarged RV size, mildly elevated  PASP. RVSP 43    P -DAPT  -volume management per RRT  -cards consult: monitor   Acute on chronic renal failure, ESRD on HD.  Only had a partial HD run on 10/27.   Hyperkalemia Hyperphosphatemia  Uremia -HD 11/3 and again 11/6  Anemia  -trend -transfuse for <7   Asthma without exacerbation  -breo ellipta, PRN duoneb, singulair   DM2/Diabetic neuropathy /Gastroparesis  -SSI -gabapentin- d/c'd   Tumoral calcinosis, recent left thigh/hip surgery -Wound care  -culture the purulent discharge- NGTD -will need to [facility]    DVT prophylaxis: heparin gtt    Code Status: Full Code Family Communication: none at bedside today-- patient in HD  Disposition Plan:  Level of care: Progressive Status is: Inpatient Remains inpatient appropriate because: needs angiogram  Consultants:  PCCM Renal vascular   Subjective: In HD- bay 1  Objective: Vitals:   11/15/21 2025 11/16/21 0014 11/16/21 0500 11/16/21 0546  BP: (!) 150/59 (!) 131/57  (!) 152/61  Pulse: 85   (!) 25  Resp: '16 16  16  '$ Temp: 98.1 F (36.7 C) 98.2 F (36.8 C)  98.3 F (36.8 C)  TempSrc: Oral Oral  Oral  SpO2: 97% 97%  98%  Weight:   66.6 kg     Intake/Output Summary (Last 24 hours) at 11/16/2021 0747 Last data filed at 11/16/2021 0645 Gross per 24 hour  Intake 576.93 ml  Output 1 ml  Net 575.93 ml     Filed Weights   11/15/21 0348 11/15/21 0400 11/16/21 0500  Weight: 69.2 kg 66.4 kg 66.6 kg    Examination:    General: Appearance:  Well developed, well nourished female in no acute distress     Lungs:     Clear to auscultation bilaterally, respirations unlabored  Heart:    rrr  MS:   All extremities are intact.   Neurologic:   Awake, alert,       Data Reviewed: I have personally reviewed following labs and imaging studies  CBC: Recent Labs  Lab 11/10/21 0408 11/11/21 0256 11/12/21 0512 11/15/21 0547 11/16/21 0500  WBC 13.0* 13.0* 13.0* 17.0* 17.3*  HGB 7.4*  7.6* 7.5* 8.1* 8.2*  HCT 25.4* 25.9* 25.4* 28.0* 28.4*  MCV 80.9 80.4 80.4 81.2 82.3  PLT 312 327 376 500* 254*   Basic Metabolic Panel: Recent Labs  Lab 11/11/21 0256 11/12/21 0512 11/13/21 0148 11/14/21 0205 11/15/21 0547 11/16/21 0500 11/16/21 0550  NA 137  136 134* 135 137 132* 135 136  K 5.2*  5.2* 4.2 4.3 4.5 4.2 4.3 4.4  CL 103  102 94* 94* 93* 90* 92* 95*  CO2 21*  21* '23 25 25 26 23 24  '$ GLUCOSE 137*  137* 84 94 86 97 94 94  BUN 67*  72* 35* 48* 24* 36* 46* 46*  CREATININE 7.62*  7.54* 4.83* 6.05* 3.90* 5.47* 6.60* 6.60*  CALCIUM 8.0*  7.9* 8.4* 8.4* 9.0 8.4* 8.7* 8.7*  MG 2.5*  --   --   --   --   --   --   PHOS 9.1*  9.0* 6.4* 7.0* 6.1* 7.2*  --  8.0*   GFR: Estimated Creatinine Clearance: 9.1 mL/min (A) (by C-G formula based on SCr of 6.6 mg/dL (H)). Liver Function Tests: Recent Labs  Lab 11/12/21 0512 11/13/21 0148 11/14/21 0205 11/15/21 0547 11/16/21 0550  ALBUMIN 1.9* 1.8* 1.9* 1.8* 1.8*   No results for input(s): "LIPASE", "AMYLASE" in the last 168 hours. Recent Labs  Lab 11/11/21 1041  AMMONIA 30   Coagulation Profile: No results for input(s): "INR", "PROTIME" in the last 168 hours.  Cardiac Enzymes: Recent Labs  Lab 11/11/21 1041  CKTOTAL 38   BNP (last 3 results) No results for input(s): "PROBNP" in the last 8760 hours. HbA1C: No results for input(s): "HGBA1C" in the last 72 hours.  CBG: Recent Labs  Lab 11/14/21 2147 11/15/21 0739 11/15/21 1154 11/15/21 1602 11/15/21 2133  GLUCAP 133* 100* 91 112* 122*   Lipid Profile: No results for input(s): "CHOL", "HDL", "LDLCALC", "TRIG", "CHOLHDL", "LDLDIRECT" in the last 72 hours. Thyroid Function Tests: No results for input(s): "TSH", "T4TOTAL", "FREET4", "T3FREE", "THYROIDAB" in the last 72 hours. Anemia Panel: No results for input(s): "VITAMINB12", "FOLATE", "FERRITIN", "TIBC", "IRON", "RETICCTPCT" in the last 72 hours.  Sepsis Labs: Recent Labs  Lab 11/10/21 0408  11/11/21 0824 11/12/21 0512 11/13/21 0148  PROCALCITON 27.44 25.41 21.71 18.01    Recent Results (from the past 240 hour(s))  SARS Coronavirus 2 by RT PCR (hospital order, performed in [facility] hospital lab) *cepheid single result test* Anterior Nasal Swab     Status: None   Collection Time: 11/08/21  9:04 AM   Specimen: Anterior Nasal Swab  Result Value Ref Range Status   SARS Coronavirus 2 by RT PCR NEGATIVE NEGATIVE Final    Comment: (NOTE) SARS-CoV-2 target nucleic acids are NOT DETECTED.  The SARS-CoV-2 RNA is generally detectable in upper and lower respiratory specimens during the acute phase of infection. The lowest concentration of SARS-CoV-2 viral copies this assay can detect is 250 copies / mL. A negative result does not preclude SARS-CoV-2 infection  and should not be used as the sole basis for treatment or other patient management decisions.  A negative result may occur with improper specimen collection / handling, submission of specimen other than nasopharyngeal swab, presence of viral mutation(s) within the areas targeted by this assay, and inadequate number of viral copies (<250 copies / mL). A negative result must be combined with clinical observations, patient history, and epidemiological information.  Fact Sheet for Patients:   https://www.patel.info/  Fact Sheet for Healthcare Providers: https://hall.com/  This test is not yet approved or  cleared by the [place] FDA and has been authorized for detection and/or diagnosis of SARS-CoV-2 by FDA under an Emergency Use Authorization (EUA).  This EUA will remain in effect (meaning this test can be used) for the duration of the COVID-19 declaration under Section 564(b)(1) of the Act, 21 U.S.C. section 360bbb-3(b)(1), unless the authorization is terminated or revoked sooner.  Performed at [facility], [place] [address] [place], [place] [address]   MRSA Next  Gen by PCR, Nasal     Status: None   Collection Time: 11/08/21  3:14 PM   Specimen: Nasal Mucosa; Nasal Swab  Result Value Ref Range Status   MRSA by PCR Next Gen NOT DETECTED NOT DETECTED Final    Comment: (NOTE) The GeneXpert MRSA Assay (FDA approved for NASAL specimens only), is one component of a comprehensive MRSA colonization surveillance program. It is not intended to diagnose MRSA infection nor to guide or monitor treatment for MRSA infections. Test performance is not FDA approved in patients less than 62 years old. Performed at [facility], [place] [address] [place], [place] [address]   Culture, blood (Routine X 2) w Reflex to ID Panel     Status: None (Preliminary result)   Collection Time: 11/12/21 11:47 AM   Specimen: BLOOD RIGHT WRIST  Result Value Ref Range Status   Specimen Description BLOOD RIGHT WRIST  Final   Special Requests   Final    BOTTLES DRAWN AEROBIC AND ANAEROBIC Blood Culture adequate volume   Culture   Final    NO GROWTH 4 DAYS Performed at [facility], [place] [address], [place], [place] [address]    Report Status PENDING  Incomplete  Culture, blood (Routine X 2) w Reflex to ID Panel     Status: None (Preliminary result)   Collection Time: 11/12/21 12:00 PM   Specimen: BLOOD RIGHT WRIST  Result Value Ref Range Status   Specimen Description BLOOD RIGHT WRIST  Final   Special Requests   Final    BOTTLES DRAWN AEROBIC AND ANAEROBIC Blood Culture adequate volume   Culture   Final    NO GROWTH 4 DAYS Performed at [facility], [place] [address], [place], [place] [address]    Report Status PENDING  Incomplete  Aerobic Culture w Gram Stain (superficial specimen)     Status: None   Collection Time: 11/12/21  2:25 PM   Specimen: Wound  Result Value Ref Range Status   Specimen Description WOUND HIP  Final   Special Requests  LEFT  Final   Gram Stain NO WBC SEEN NO ORGANISMS SEEN   Final   Culture   Final    NO GROWTH 2  DAYS Performed at [facility], [address], [place], [place] [address]    Report Status 11/14/2021 FINAL  Final         Radiology Studies: No results found.  Scheduled Meds:  (feeding supplement) PROSource Plus  30 mL Oral BID BM   aspirin  81 mg Oral Daily   Chlorhexidine Gluconate Cloth  6 each Topical Daily   clopidogrel  75 mg Oral Q breakfast   darbepoetin (ARANESP) injection - NON-DIALYSIS  200 mcg Subcutaneous Q Mon-1800   doxercalciferol  7 mcg Intravenous Q M,W,F-HD   fluticasone furoate-vilanterol  1 puff Inhalation Daily   insulin aspart  0-5 Units Subcutaneous QHS   insulin aspart  0-6 Units Subcutaneous TID WC   lanthanum  1,000 mg Oral TID WC   lidocaine  1 patch Transdermal Q24H   metoprolol succinate  25 mg Oral Daily   montelukast  10 mg Oral QHS   pantoprazole (PROTONIX) IV  40 mg Intravenous QHS   thiamine (VITAMIN B1) injection  100 mg Intravenous Daily   Continuous Infusions:  heparin 1,250 Units/hr (11/16/21 0634)   piperacillin-tazobactam (ZOSYN)  IV 2.25 g (11/16/21 0546)     LOS: 8 days    Time spent: 45 minutes spent on chart review, discussion with nursing staff, consultants, updating family and interview/physical exam; more than 50% of that time was spent in counseling and/or coordination of care.    Geradine Girt, DO [facility] Available via Epic secure chat 7am-7pm After these hours, please refer to coverage provider listed on [email or web address] 11/16/2021, 7:47 AM

## 2021-11-16 NOTE — Interval H&P Note (Signed)
History and Physical Interval Note:  11/16/2021 2:03 PM  Susan Horton  has presented today for surgery, with the diagnosis of claudication.  The various methods of treatment have been discussed with the patient and family. After consideration of risks, benefits and other options for treatment, the patient has consented to  Procedure(s): ABDOMINAL AORTOGRAM W/LOWER EXTREMITY (N/A) as a surgical intervention.  The patient's history has been reviewed, patient examined, no change in status, stable for surgery.  I have reviewed the patient's chart and labs.  Questions were answered to the patient's satisfaction.     Servando Snare

## 2021-11-16 NOTE — Progress Notes (Addendum)
Received patient in bed ,before the start of her HD treatment . Alert  and oriented x 4.  Denies pain though verbally expressed discomfort to her left leg,nurse elevated left leg on a pillow.  Access used:Left upper arm fistula,works well during HD treatment.  HD treatment issue:None  Bolus given:None  Medicine given:Oxycodone '5mg'$   p.o                           Hectorol iv 22mg                             Fluid removed: 2.5 Liters  Pre-HD treatment  weight :66.kg  Post HD weight: 64.4 kg.  Treatment duration :3.75 hours.  Hands off to the floor nurse:

## 2021-11-16 NOTE — Progress Notes (Addendum)
PT Cancellation Note  Patient Details Name: JASMAIN AHLBERG MRN: [id number] DOB: 1970/09/12   Cancelled Treatment:    Reason Eval/Treat Not Completed: (P) Patient at procedure or test/unavailable Pt continues to be off floor for procedures. PT will follow back for treatment tomorrow.  Tiwanda Threats B. Migdalia Dk PT, DPT Acute Rehabilitation Services Please use secure chat or  Call Office [phone]     [place] 11/16/2021, 2:43 PM

## 2021-11-16 NOTE — Progress Notes (Signed)
PT Cancellation Note  Patient Details Name: Susan Horton MRN: [id number] DOB: 1970/06/24   Cancelled Treatment:    Reason Eval/Treat Not Completed: (P) Patient at procedure or test/unavailable. Pt is in HD. PT will follow back for treatment this afternoon as able. Treshawn Allen B. Migdalia Dk PT, DPT Acute Rehabilitation Services Please use secure chat or  Call Office [phone]    [place] 11/16/2021, 8:55 AM

## 2021-11-16 NOTE — Op Note (Signed)
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 [name], MD Procedure Performed: 1.  Also guided cannulation right common femoral artery 2.  Stent of right common iliac artery 7 x 29 mm VBX 3.  Aortogram with left lower extremity angiogram 4.  Stent of left common iliac artery with 7 x 59 mm VBX 5.  Jetstream atherectomy left common femoral artery and SFA 6.  Drug-coated balloon angioplasty left common femoral artery with 6 x 40 mm Ranger 7.  Stent of left SFA with proximal 5 x 140 mm [place]ver and distal 5 x 80 mm [place] 8. Moderate sedation with fentanyl and Versed for 101 minutes   Indications: 51 year old female with history of left external iliac artery and left SFA stenting for short distance claudication.  She has now been admitted with left hip infection and has severe rest pain in the left lower extremity with concern for occlusion of her stents and is indicated for angiography with possible intervention.  Findings: The aorta and iliac segments were heavily calcified.  I could actually not pass the right common iliac artery without stenting first.  Aortogram demonstrated a calcified artery.  Left common femoral artery was subtotally occluded at least 95% stenosed and there was a 90% stenosis in the proximal SFA.  The profunda was patent.  The existing SFA stent existing external iliac artery stents were patent.  The left SFA distal to the stents was diseased with subtotal occlusion at least 95% stenosis.  There was single-vessel runoff via the posterior tibial artery which did fill the foot.  After atherectomy and stenting and drug-coated balloon angioplasty there was brisk flow through the common femoral artery and SFA down to the posterior tibial artery.  There was still common iliac artery lesion on  the left this was primarily stented and the distal pressure did improve 40 mmHg at the common femoral artery after stenting.  There was a very strong posterior tibial signal at completion.   Procedure:  The patient was identified in the holding area and taken to room 8.  The patient was then placed supine on the table and prepped and draped in the usual sterile fashion.  A time out was called.  Ultrasound was used to evaluate the right common femoral artery which was heavily diseased.  The area was anesthetized 1% lidocaine cannulated by puncture needle followed by wire and sheath.  Ultrasound guidance was used and images saved the permanent record.  Concomitantly we administered fentanyl and Versed and her vital signs were monitored throughout the case.  A Bentson wire was placed followed by 5 [place] sheath.  Of Berenstein catheter was used to cross the high-grade stenosis in the common iliac artery on the right and this was primarily stented with a 7 x 29 mm VBX.  Aortogram was then performed.  I did cross the bifurcation using Glidewire advantage and crossover catheter and then placed a long 7 French sheath over the bifurcation had to perform a left lower extremity angiography and the patient was fully heparinized prior to placing a 7 x 29 mm VBX.  We then were able to cross all of the occluded common femoral artery and stenosis in the SFA all the way down to the popliteal artery perform distal angiogram which demonstrated runoff via the posterior tibial to the foot.  I placed  an 014 wire followed by a Nava 6 filter.  We then performed jetstream atherectomy the common femoral artery and proximal SFA followed by balloon angioplasty of the common femoral artery with 5 and 6 mm balloons followed by drug-coated balloon angioplasty of the common femoral artery.  The filter was then removed and I exchanged for a separate 014 wire that was placed distally.  We then primarily stented the distal SFA with a 5 x 80 mm  drug-eluting stent followed by 5 x 140 mm drug-eluting stent proximally all ballooned with 5 mm balloon.  Completion demonstrated brisk runoff via the SFA and the common femoral was patent.  At that time we had a systolic pressure of 75 and the sheath at the common femoral artery.  I elected to primarily stent the common iliac artery and this was primarily stented with a 7 x 59 mm VBX without incidence.  Completion demonstrated an improvement in systolic pressure at the common femoral artery to 115 with only a 15 mm gradient from the aorta at this time.  We then exchanged over the Amplatz wire for a short 7 French sheath and removed the Amplatz with quick cross catheter.  Retrograde imaging was performed of the right common femoral artery there was no active extravasation this will be pulled postoperative holding.  Patient tolerated procedure without any complication.   Contrast: 130cc  Prayan Ulin C. [name], MD [facility] Office: [phone] Pager: [phone]

## 2021-11-16 NOTE — H&P (View-Only) (Signed)
[facility]  Subjective  -having pain in her left foot.  Seen in dialysis.   Objective (!) 155/68 87 97.9 F (36.6 C) (Oral) 20 97%  Intake/Output Summary (Last 24 hours) at 11/16/2021 0800 Last data filed at 11/16/2021 0645 Gross per 24 hour  Intake 576.93 ml  Output 1 ml  Net 575.93 ml    Left femoral pulse palpable Left foot drop that is new but foot is viable  Laboratory Lab Results: Recent Labs    11/15/21 0547 11/16/21 0500  WBC 17.0* 17.3*  HGB 8.1* 8.2*  HCT 28.0* 28.4*  PLT 500* 512*   BMET Recent Labs    11/16/21 0500 11/16/21 0550  NA 135 136  K 4.3 4.4  CL 92* 95*  CO2 23 24  GLUCOSE 94 94  BUN 46* 46*  CREATININE 6.60* 6.60*  CALCIUM 8.7* 8.7*    COAG Lab Results  Component Value Date   INR 1.3 (H) 11/09/2021   INR 1.2 11/08/2021   INR 1.1 01/09/2019   No results found for: "PTT"  Assessment/Planning:  Plan aortogram with lower extremity arteriogram with possible intervention with a focus on the left leg.  She had left hip surgery about 5 weeks ago for tumoral calcinosis at [facility] and was admitted to [facility] with sepsis last week and on pressors.  New rest pain in the left foot over the last week while she was critically ill.  Plan aortogram with lower extremity arteriogram with a focus on the left leg by Dr. [name] today.  [name] 11/16/2021 8:00 AM --

## 2021-11-16 NOTE — Progress Notes (Signed)
Brief cardiology progress note:  Seen by Dr. Tamala Julian last week in consultation, recommended no further workup of tricuspid regurgitation at this time.   Cardiology will formally sign off, please contact us with any questions.  Buford Dresser, MD, PhD, [facility] at [facility] at [facility] [address] [place], [place] [address] [phone]

## 2021-11-16 NOTE — Progress Notes (Signed)
Pre Site level: 0  7FR sheath pull @ 5:40 approximately 20 mins of manual pressure held. Hemostasis achieved.  Post site level: 0  Tegaderm and 4 x 4 gauze applied to site. Instructions given to pt, pt verbalizes understanding. Right PT doppler.

## 2021-11-17 ENCOUNTER — Encounter (HOSPITAL_COMMUNITY): Payer: Self-pay | Admitting: Vascular Surgery

## 2021-11-17 DIAGNOSIS — H903 Sensorineural hearing loss, bilateral: Secondary | ICD-10-CM

## 2021-11-17 DIAGNOSIS — E875 Hyperkalemia: Secondary | ICD-10-CM | POA: Diagnosis not present

## 2021-11-17 DIAGNOSIS — R4182 Altered mental status, unspecified: Secondary | ICD-10-CM | POA: Diagnosis not present

## 2021-11-17 DIAGNOSIS — A419 Sepsis, unspecified organism: Secondary | ICD-10-CM | POA: Diagnosis not present

## 2021-11-17 LAB — RENAL FUNCTION PANEL
Albumin: 1.8 g/dL — ABNORMAL LOW (ref 3.5–5.0)
Anion gap: 14 (ref 5–15)
BUN: 25 mg/dL — ABNORMAL HIGH (ref 6–20)
CO2: 25 mmol/L (ref 22–32)
Calcium: 8.6 mg/dL — ABNORMAL LOW (ref 8.9–10.3)
Chloride: 95 mmol/L — ABNORMAL LOW (ref 98–111)
Creatinine, Ser: 4.86 mg/dL — ABNORMAL HIGH (ref 0.44–1.00)
GFR, Estimated: 10 mL/min — ABNORMAL LOW (ref >60)
Glucose, Bld: 145 mg/dL — ABNORMAL HIGH (ref 70–99)
Phosphorus: 7.2 mg/dL — ABNORMAL HIGH (ref 2.5–4.6)
Potassium: 3.9 mmol/L (ref 3.5–5.1)
Sodium: 134 mmol/L — ABNORMAL LOW (ref 135–145)

## 2021-11-17 LAB — GLUCOSE, CAPILLARY
Glucose-Capillary: 135 mg/dL — ABNORMAL HIGH (ref 70–99)
Glucose-Capillary: 184 mg/dL — ABNORMAL HIGH (ref 70–99)
Glucose-Capillary: 154 mg/dL — ABNORMAL HIGH (ref 70–99)
Glucose-Capillary: 130 mg/dL — ABNORMAL HIGH (ref 70–99)

## 2021-11-17 LAB — CBC
HCT: 25.5 % — ABNORMAL LOW (ref 36.0–46.0)
Hemoglobin: 7.5 g/dL — ABNORMAL LOW (ref 12.0–15.0)
MCH: 23.6 pg — ABNORMAL LOW (ref 26.0–34.0)
MCHC: 29.4 g/dL — ABNORMAL LOW (ref 30.0–36.0)
MCV: 80.2 fL (ref 80.0–100.0)
Platelets: 415 10*3/uL — ABNORMAL HIGH (ref 150–400)
RBC: 3.18 MIL/uL — ABNORMAL LOW (ref 3.87–5.11)
RDW: 20.3 % — ABNORMAL HIGH (ref 11.5–15.5)
WBC: 17.6 10*3/uL — ABNORMAL HIGH (ref 4.0–10.5)
nRBC: 0 % (ref 0.0–0.2)

## 2021-11-17 LAB — CULTURE, BLOOD (ROUTINE X 2)
Culture: NO GROWTH
Culture: NO GROWTH
Special Requests: ADEQUATE
Special Requests: ADEQUATE

## 2021-11-17 LAB — POCT ACTIVATED CLOTTING TIME: Activated Clotting Time: 179 seconds

## 2021-11-17 MED ORDER — CHLORHEXIDINE GLUCONATE CLOTH 2 % EX PADS
6.0000 | MEDICATED_PAD | Freq: Every day | CUTANEOUS | Status: DC
  Administered 2021-11-17 – 2021-11-19 (×3): 6 via TOPICAL

## 2021-11-17 NOTE — Evaluation (Addendum)
Physical Therapy Re-Evaluation Patient Details Name: Susan Horton MRN: [id number] DOB: [birth year] Today's Date: 11/17/2021  History of Present Illness  51 y/o female presented to ED on 11/08/21 for progressive fatigue and weakness after fall on 10/27. Admitted for septic shock with unclear origin. Recent L hip tumoral calcinosis surgery a few weeks ago at [facility] with associated drainage from L hip. Underwent  stent of left common iliac artery with atherectomy and DCB of the left common femoral artery and stent of the left SFA in the setting of multilevel high-grade stenosis with rest pain on 11/6.  PMH: ESRD, CAD, HTN, CHF, T2DM with peripheral neuropathy and gastroparesis.  Clinical Impression  Pt admitted with above diagnosis. Pt was able to stand to [facility] with max to mod assist of 2 persons. Pt not bearing much weight on left LE due to pain.  Discussed possible need for left AFO with pt and MD and discussed with MD to request consult.  Asked family to bring in some tennis shoes for pt as she will need this when using AFO.   Agree with AIR as pt has support at home and will benefit from aggressive therapy.  Goals revised today given that pt had surgery yesterday and PT re-evaluated today.  She has made slow progress due to missed session due to HD as well as medical issues. Pt currently with functional limitations due to the deficits listed below (see PT Problem List). Pt will benefit from skilled PT to increase their independence and safety with mobility to allow discharge to the venue listed below.          Recommendations for follow up therapy are one component of a multi-disciplinary discharge planning process, led by the attending physician.  Recommendations may be updated based on patient status, additional functional criteria and insurance authorization.  Follow Up Recommendations Acute inpatient rehab (3hours/day)      Assistance Recommended at Discharge Frequent or constant  Supervision/Assistance  Patient can return home with the following  A lot of help with walking and/or transfers;A lot of help with bathing/dressing/bathroom;Assistance with cooking/housework;Direct supervision/assist for financial management;Direct supervision/assist for medications management;Assist for transportation;Help with stairs or ramp for entrance    Equipment Recommendations Other (comment) (TBD)  Recommendations for Other Services  Rehab consult    Functional Status Assessment Patient has had a recent decline in their functional status and demonstrates the ability to make significant improvements in function in a reasonable and predictable amount of time.     Precautions / Restrictions Precautions Precautions: Fall Precaution Comments: recent hip sx to L hip; left foot drop and bil hearing loss noted 11/7 Restrictions Weight Bearing Restrictions: No      Mobility  Bed Mobility Overal bed mobility: Needs Assistance Bed Mobility: Supine to Sit, Sit to Supine, Rolling Rolling: Min assist   Supine to sit: HOB elevated, Min assist     General bed mobility comments: Cues for sequencing and hand palcement to use bed rail. Assist to bring bil left LE off EOB and to raise trunk upright. Pt can scoot out with incr time.    Transfers Overall transfer level: Needs assistance Equipment used: Ambulation equipment used Transfers: Sit to/from Stand, Bed to chair/wheelchair/BSC Sit to Stand: +2 physical assistance, +2 safety/equipment, From elevated surface, Mod assist, Max assist           General transfer comment: Pt stood to [facility] with +2 max assist from bed. Pt able to stand x 3 from [place] progressing from need  for mod assist of 2 to min assist of 2 the last atttempt.  When standing, pt was avoiding WB on Lt LE due to foot pain with pt placing TTWB left LE.  Pt was moved to recliner in [place]. Transfer via Lift Equipment: Stedy  Ambulation/Gait                   Stairs            Wheelchair Mobility    Modified Rankin (Stroke Patients Only)       Balance Overall balance assessment: Needs assistance Sitting-balance support: Feet supported, No upper extremity supported Sitting balance-Leahy Scale: Fair Sitting balance - Comments: Can sit EOB on her own.  Tried to put sock on right foot but couldnt.   Standing balance support: Bilateral upper extremity supported, During functional activity Standing balance-Leahy Scale: Poor Standing balance comment: Needed assist to stand to Stedy x 4 x with mod to max assist of 2. Could not extend trunk or hips fully and able to stand 10-15 seconds each attempt.  Also did not put weight on left LE.                             Pertinent Vitals/Pain Pain Assessment Pain Assessment: Faces Faces Pain Scale: Hurts even more Breathing: normal Negative Vocalization: none Facial Expression: smiling or inexpressive Body Language: relaxed Consolability: no need to console PAINAD Score: 0 Pain Location: Lt foot Pain Descriptors / Indicators: Grimacing, Guarding Pain Intervention(s): Limited activity within patient's tolerance, Monitored during session, Repositioned    Home Living Family/patient expects to be discharged to:: Private residence Living Arrangements: Non-relatives/Friends Available Help at Discharge: Family;Available 24 hours/day Type of Home: House Home Access: Level entry       Home Layout: One level Home Equipment: Rollator (4 wheels);Cane - single point Additional Comments: Patient wil be stayig at sister's home initially.  Above information is sister.    Prior Function Prior Level of Function : Needs assist             Mobility Comments: amb with SPC PTA ADLs Comments: sister assists with ADLs since L hip surgery     Hand Dominance   Dominant Hand: Right    Extremity/Trunk Assessment   Upper Extremity Assessment Upper Extremity Assessment: Defer to OT  evaluation    Lower Extremity Assessment Lower Extremity Assessment: LLE deficits/detail;RLE deficits/detail RLE Deficits / Details: grossly 3-/5 LLE Deficits / Details: 1/5 plantar flexion and 0/5 dorsiflexion, knee 2-/5, hip 2-/5; ROM hip, knee and ankle limited by pain    Cervical / Trunk Assessment Cervical / Trunk Assessment: Normal  Communication   Communication: No difficulties  Cognition Arousal/Alertness: Awake/alert Behavior During Therapy: WFL for tasks assessed/performed Overall Cognitive Status: Within Functional Limits for tasks assessed Area of Impairment: Orientation, Attention, Memory, Following commands, Safety/judgement, Awareness, Problem solving                 Orientation Level: Disoriented to, Time, Place, Situation Current Attention Level: Focused Memory: Decreased short-term memory Following Commands: Follows one step commands with increased time, Follows one step commands inconsistently Safety/Judgement: Decreased awareness of safety, Decreased awareness of deficits Awareness: Intellectual Problem Solving: Slow processing, Decreased initiation, Difficulty sequencing, Requires verbal cues, Requires tactile cues General Comments: pt awake and alert, engaging in conversation. appears oriented x4. pt able to discuss PLOF and express needs. follows cues well. responds well to encouragement.  General Comments      Exercises Total Joint Exercises Heel Slides: AAROM, Both, 5 reps, Supine Straight Leg Raises: AAROM, Both, 5 reps, Supine General Exercises - Lower Extremity Ankle Circles/Pumps: PROM, Left, 5 reps, Limitations   Assessment/Plan    PT Assessment Patient needs continued PT services  PT Problem List Decreased strength;Decreased activity tolerance;Decreased balance;Decreased mobility;Decreased coordination;Decreased cognition;Decreased knowledge of use of DME;Decreased safety awareness;Decreased knowledge of precautions       PT  Treatment Interventions DME instruction;Gait training;Functional mobility training;Therapeutic activities;Therapeutic exercise;Balance training;Patient/family education;Neuromuscular re-education    PT Goals (Current goals can be found in the Care Plan section)  Acute Rehab PT Goals Patient Stated Goal: get better and back to walking PT Goal Formulation: With patient/family Time For Goal Achievement: 12/01/21 Potential to Achieve Goals: Fair    Frequency Min 3X/week     Co-evaluation PT/OT/SLP Co-Evaluation/Treatment: Yes Reason for Co-Treatment: Complexity of the patient's impairments (multi-system involvement);For patient/therapist safety PT goals addressed during session: Mobility/safety with mobility         AM-PAC PT "6 Clicks" Mobility  Outcome Measure Help needed turning from your back to your side while in a flat bed without using bedrails?: A Little Help needed moving from lying on your back to sitting on the side of a flat bed without using bedrails?: A Little Help needed moving to and from a bed to a chair (including a wheelchair)?: Total Help needed standing up from a chair using your arms (e.g., wheelchair or bedside chair)?: Total Help needed to walk in hospital room?: Total Help needed climbing 3-5 steps with a railing? : Total 6 Click Score: 10    End of Session Equipment Utilized During Treatment: Gait belt Activity Tolerance: Patient limited by fatigue;Patient limited by pain Patient left: with call bell/phone within reach;with family/visitor present;in chair;with chair alarm set Nurse Communication: Mobility status;Need for lift equipment (use Stedy) PT Visit Diagnosis: Unsteadiness on feet (R26.81);Muscle weakness (generalized) (M62.81);History of falling (Z91.81);Difficulty in walking, not elsewhere classified (R26.2);Other symptoms and signs involving the nervous system (R29.898)    Time: 0786-7544 PT Time Calculation (min) (ACUTE ONLY): 37  min   Charges:   PT Evaluation $PT Re-evaluation: 1 Re-eval          Amala Petion M,PT Acute Rehab Services [phone]   Alvira Philips 11/17/2021, 11:45 AM

## 2021-11-17 NOTE — Progress Notes (Signed)
Orthopedic Tech Progress Note Patient Details:  Susan Horton 03/29/1970 [id number]  Called in order to [employer] for a CUSTOM AFO   Patient ID: Susan Horton, female   DOB: Jul 24, 1970, 51 y.o.   MRN: [id number]  Susan Horton 11/17/2021, 12:07 PM

## 2021-11-17 NOTE — TOC Initial Note (Signed)
Transition of Care [facility]) - Initial/Assessment Note    Patient Details  Name: Susan Horton MRN: [id number] Date of Birth: 1970/03/15  Transition [facility]) CM/SW Contact:    Bethena Roys, RN Phone Number: 11/17/2021, 2:57 PM  Clinical Narrative:  Risk for readmission assessment completed. PTA patient states she was independent from home alone in an apartment. Patients mother was at the bedside during the visit.PT/OT recommendations are for CIR. Case Manager will continue to follow for transition of care needs as she progresses.                 Expected Discharge Plan: IP Rehab Facility Barriers to Discharge: Continued Medical Work up   Patient Goals and CMS Choice Patient states their goals for this hospitalization and ongoing recovery are:: to be able to walk      Expected Discharge Plan and Services Expected Discharge Plan: [place] In-house Referral: NA Discharge Planning Services: CM Consult Post Acute Care Choice: IP Rehab Living arrangements for the past 2 months: Apartment                 DME Arranged: N/A DME Agency: NA  Prior Living Arrangements/Services Living arrangements for the past 2 months: Apartment Lives with:: Self Patient language and need for interpreter reviewed:: Yes Do you feel safe going back to the place where you live?: Yes      Need for Family Participation in Patient Care: Yes (Comment) Care giver support system in place?: Yes (comment)   Criminal Activity/Legal Involvement Pertinent to Current Situation/Hospitalization: No - Comment as needed  Permission Sought/Granted Permission sought to share information with : Family Supports, Case Freight forwarder, Customer service manager    Emotional Assessment Appearance:: Appears stated age Attitude/Demeanor/Rapport: Engaged Affect (typically observed): Appropriate Orientation: : Oriented to Self, Oriented to Place, Oriented to  Time, Oriented to Situation Alcohol /  Substance Use: Not Applicable Psych Involvement: No (comment)  Admission diagnosis:  Hyperkalemia [E87.5] Septic shock ([place]) [A41.9, R65.21] Altered mental status, unspecified altered mental status type [R41.82] Pneumonia of right lung due to infectious organism, unspecified part of lung [J18.9] Sepsis, due to unspecified organism, unspecified whether acute organ dysfunction present [facility]) [A41.9] Patient Active Problem List   Diagnosis Date Noted   Pressure injury of skin 24/26/8341   Acute metabolic encephalopathy    Severe sepsis ([place])    Cellulitis    Anemia due to chronic kidney disease, on chronic dialysis ([place])    Altered mental status    Sepsis ([place]) 11/08/2021   Diabetes mellitus ([place]) 01/29/2021   Hypertension associated with stage 5 chronic kidney disease due to type 2 diabetes mellitus ([place]) 01/29/2021   Tumoral calcinosis 01/29/2021   Dysphagia    Nausea 09/12/2019   Diarrhea, unspecified 09/12/2019   Pruritus, unspecified 08/13/2019   Anaphylactic shock, unspecified, sequela 08/13/2019   Allergy, unspecified, sequela 08/13/2019   Unspecified cataract 03/19/2019   Unspecified asthma with (acute) exacerbation 03/19/2019   COVID-19 03/06/2019   Intractable nausea and vomiting 02/24/2019   Abdominal pain, epigastric 02/24/2019   Pneumonia of right lung due to infectious organism 02/24/2019   Shortness of breath 01/19/2019   Cerebrovascular small vessel disease    Generalized weakness    Abscess of sacrum ([place])    Gluteal abscess 12/25/2018   Moderate protein-calorie malnutrition ([place]) 12/19/2018   Other disorders resulting from impaired renal tubular function 12/19/2018   Liver disease, unspecified 12/19/2018   Hyperkalemia 12/19/2018   Acidosis 12/19/2018   Hyperlipidemia  11/10/2018   History of arteriosclerotic vascular disease 11/10/2018   Carotid stenosis 11/10/2018   Symptomatic anemia 09/16/2018   Peripheral vascular disease, unspecified ([place]) 09/04/2018    Other disorders of bilirubin metabolism 01/20/2018   Other long term (current) drug therapy 11/28/2017   Other abnormal findings in urine 11/28/2017   Dependence on renal dialysis ([place]) 11/28/2017   ESRD on peritoneal dialysis ([place]) 93/81/8299   Complication of vascular dialysis catheter 09/15/2017   History of anemia due to CKD 07/18/2017   DVT (deep vein thrombosis) in pregnancy 07/18/2017   Encounter for removal of sutures 07/07/2017   Iron deficiency anemia, unspecified 06/30/2017   Secondary hyperparathyroidism of renal origin ([place]) 06/24/2017   Pain, unspecified 06/24/2017   Fever, unspecified 06/24/2017   ESRD (end stage renal disease) ([place]) 06/24/2017   Coagulation defect, unspecified ([place]) 06/24/2017   Anemia in chronic kidney disease 06/24/2017   Chronic diastolic CHF (congestive heart failure) ([place]) 06/18/2017   Moderate persistent asthma 06/10/2017   Adjustment disorder with depressed mood 09/08/2016   CKD (chronic kidney disease) stage 4, GFR 15-29 ml/min (HCC)    CAD S/P mLAD PCI with DES 08/06/2016   Presence of drug coated stent in LAD coronary artery 08/06/2016   Atherosclerotic heart disease of native coronary artery without angina pectoris 07/11/2016   Carotid bruit present 06/15/2016   Dyslipidemia 06/15/2016   Smoker 06/15/2016   Neuropathy 03/10/2016   Essential hypertension, benign 12/11/2015   Left facial pain 03/04/2015   Abnormal sense of taste 03/04/2015   Gastroparesis    Uncontrolled type 2 diabetes mellitus with peripheral neuropathy 06/05/2014   Yeast infection of the vagina 07/10/2010   PCP:  Charlott Rakes, MD Pharmacy:   [id number] [place], [address] [place]Phone: [phone]: [address] [address]Phone: [phone]: [phone] Readmission Risk Interventions    11/17/2021    2:53 PM  Readmission Risk Prevention Plan  Transportation Screening Complete  Medication Review (RN Care Manager) Complete  HRI or Home Care Consult Complete  SW Recovery Care/Counseling Consult Complete  Palliative Care Screening Not Applicable  Skilled Nursing Facility Complete

## 2021-11-17 NOTE — Progress Notes (Signed)
[facility] Progress Note   Subjective:   Having decreased hearing and decreased strength in left foot since this AM. Attending is at bedside. No SOB, CP or dizziness. No issues reported with HD yesterday.  Objective Vitals:   11/16/21 1653 11/16/21 1800 11/16/21 2110 11/17/21 0453  BP: (!) 151/50 (!) 165/61 (!) 141/55 (!) 100/50  Pulse: 94 93 93 84  Resp: (!) '9 19 18 16  '$ Temp:   99.3 F (37.4 C) 99.1 F (37.3 C)  TempSrc:   Oral Oral  SpO2: 94% 96% 99% 94%  Weight:    66 kg   Physical Exam General: Alert female, HOH, NAD Heart: RRR, no murmurs, rubs or gallops Lungs: CTA bilaterally Abdomen: Non-distended, +BS Extremities: L foot drop and pain, no peripheral edema Dialysis Access: LUE AVF + bruit  Additional Objective Labs: Basic Metabolic Panel: Recent Labs  Lab 11/14/21 0205 11/15/21 0547 11/16/21 0500 11/16/21 0550  NA 137 132* 135 136  K 4.5 4.2 4.3 4.4  CL 93* 90* 92* 95*  CO2 '25 26 23 24  '$ GLUCOSE 86 97 94 94  BUN 24* 36* 46* 46*  CREATININE 3.90* 5.47* 6.60* 6.60*  CALCIUM 9.0 8.4* 8.7* 8.7*  PHOS 6.1* 7.2*  --  8.0*   Liver Function Tests: Recent Labs  Lab 11/14/21 0205 11/15/21 0547 11/16/21 0550  ALBUMIN 1.9* 1.8* 1.8*   No results for input(s): "LIPASE", "AMYLASE" in the last 168 hours. CBC: Recent Labs  Lab 11/11/21 0256 11/12/21 0512 11/15/21 0547 11/16/21 0500  WBC 13.0* 13.0* 17.0* 17.3*  HGB 7.6* 7.5* 8.1* 8.2*  HCT 25.9* 25.4* 28.0* 28.4*  MCV 80.4 80.4 81.2 82.3  PLT 327 376 500* 512*   Blood Culture    Component Value Date/Time   SDES WOUND HIP 11/12/2021 1425   SPECREQUEST  LEFT 11/12/2021 1425   CULT  11/12/2021 1425    NO GROWTH 2 DAYS Performed at [facility] [address], [place], [place] [address]    REPTSTATUS 11/14/2021 FINAL 11/12/2021 1425    Cardiac Enzymes: Recent Labs  Lab 11/11/21 1041  CKTOTAL 38   CBG: Recent Labs  Lab 11/15/21 1602 11/15/21 2133 11/16/21 1312  11/16/21 2109 11/17/21 0826  GLUCAP 112* 122* 86 89 135*   Iron Studies: No results for input(s): "IRON", "TIBC", "TRANSFERRIN", "FERRITIN" in the last 72 hours. '@lablastinr3'$ @ Studies/Results: PERIPHERAL VASCULAR CATHETERIZATION  Result Date: 11/16/2021 Images from the original result were not included. 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 [name], MD Procedure Performed: 1.  Also guided cannulation right common femoral artery 2.  Stent of right common iliac artery 7 x 29 mm VBX 3.  Aortogram with left lower extremity angiogram 4.  Stent of left common iliac artery with 7 x 59 mm VBX 5.  Jetstream atherectomy left common femoral artery and SFA 6.  Drug-coated balloon angioplasty left common femoral artery with 6 x 40 mm Ranger 7.  Stent of left SFA with proximal 5 x 140 mm Cook Zilver and distal 5 x 80 mm Cook [place]ilver 8. Moderate sedation with fentanyl and Versed for 101 minutes Indications: 51 year old female with history of left external iliac artery and left SFA stenting for short distance claudication.  She has now been admitted with left hip infection and has severe rest pain in the left lower extremity with concern  for occlusion of her stents and is indicated for angiography with possible intervention. Findings: The aorta and iliac segments were heavily calcified.  I could actually not pass the right common iliac artery without stenting first.  Aortogram demonstrated a calcified artery.  Left common femoral artery was subtotally occluded at least 95% stenosed and there was a 90% stenosis in the proximal SFA.  The profunda was patent.  The existing SFA stent existing external iliac artery stents were patent.  The left SFA distal to the stents was diseased with subtotal occlusion at least 95% stenosis.  There was  single-vessel runoff via the posterior tibial artery which did fill the foot.  After atherectomy and stenting and drug-coated balloon angioplasty there was brisk flow through the common femoral artery and SFA down to the posterior tibial artery.  There was still common iliac artery lesion on the left this was primarily stented and the distal pressure did improve 40 mmHg at the common femoral artery after stenting.  There was a very strong posterior tibial signal at completion.  Procedure:  The patient was identified in the holding area and taken to room 8.  The patient was then placed supine on the table and prepped and draped in the usual sterile fashion.  A time out was called.  Ultrasound was used to evaluate the right common femoral artery which was heavily diseased.  The area was anesthetized 1% lidocaine cannulated by puncture needle followed by wire and sheath.  Ultrasound guidance was used and images saved the permanent record.  Concomitantly we administered fentanyl and Versed and her vital signs were monitored throughout the case.  A Bentson wire was placed followed by 5 [place] sheath.  Of Berenstein catheter was used to cross the high-grade stenosis in the common iliac artery on the right and this was primarily stented with a 7 x 29 mm VBX.  Aortogram was then performed.  I did cross the bifurcation using Glidewire advantage and crossover catheter and then placed a long 7 French sheath over the bifurcation had to perform a left lower extremity angiography and the patient was fully heparinized prior to placing a 7 x 29 mm VBX.  We then were able to cross all of the occluded common femoral artery and stenosis in the SFA all the way down to the popliteal artery perform distal angiogram which demonstrated runoff via the posterior tibial to the foot.  I placed an 014 wire followed by a Nava 6 filter.  We then performed jetstream atherectomy the common femoral artery and proximal SFA followed by balloon  angioplasty of the common femoral artery with 5 and 6 mm balloons followed by drug-coated balloon angioplasty of the common femoral artery.  The filter was then removed and I exchanged for a separate 014 wire that was placed distally.  We then primarily stented the distal SFA with a 5 x 80 mm drug-eluting stent followed by 5 x 140 mm drug-eluting stent proximally all ballooned with 5 mm balloon.  Completion demonstrated brisk runoff via the SFA and the common femoral was patent.  At that time we had a systolic pressure of 75 and the sheath at the common femoral artery.  I elected to primarily stent the common iliac artery and this was primarily stented with a 7 x 59 mm VBX without incidence.  Completion demonstrated an improvement in systolic pressure at the common femoral artery to 115 with only a 15 mm gradient from the aorta at this time.  We then  exchanged over the Amplatz wire for a short 7 French sheath and removed the Amplatz with quick cross catheter.  Retrograde imaging was performed of the right common femoral artery there was no active extravasation this will be pulled postoperative holding.  Patient tolerated procedure without any complication. Contrast: 130cc Brandon C. [name], MD [facility] Office: [phone] Pager: [phone]   Medications:  sodium chloride     piperacillin-tazobactam (ZOSYN)  IV 2.25 g (11/17/21 7782)    (feeding supplement) PROSource Plus  30 mL Oral BID BM   aspirin  81 mg Oral Daily   Chlorhexidine Gluconate Cloth  6 each Topical Daily   clopidogrel  75 mg Oral Q breakfast   darbepoetin (ARANESP) injection - NON-DIALYSIS  200 mcg Subcutaneous Q Mon-1800   doxercalciferol  7 mcg Intravenous Q M,W,F-HD   fluticasone furoate-vilanterol  1 puff Inhalation Daily   heparin  5,000 Units Subcutaneous Q8H   insulin aspart  0-5 Units Subcutaneous QHS   insulin aspart  0-6 Units Subcutaneous TID WC   lanthanum  1,000 mg Oral TID WC    lidocaine  1 patch Transdermal Q24H   metoprolol succinate  25 mg Oral Daily   montelukast  10 mg Oral QHS   pantoprazole (PROTONIX) IV  40 mg Intravenous QHS   sodium chloride flush  3 mL Intravenous Q12H   thiamine (VITAMIN B1) injection  100 mg Intravenous Daily    Outpatient Dialysis Orders: [facility] 3h 45mn 400/600  61kg  2/2 bath P2  Hep 4000  LUE AVF - mircera 200 mcg IV q2, last 10/16, due 10/30 - venofer '100mg'$  IV tiw thru 11/06 - doxercalciferol 7 ug tiw - sod thiosulfate 25 gm iv tiw  Assessment/Plan: Septic shock: Febrile - ?hip infection v. HCAP, was on Vanc/Cefepime/Flagyl -> finishing course of Zosyn. 2. ESRD: Required CRRT on admit due to AMS, high BUN, etc. Now back to iHD nd tolerating well. Uses L AVF for dialysis. 3. Encephalopathy: D/t shock/?meds -improved but now with decreased hearing and foot drop. No offending meds identified. Checking CBC and metabolic panel. Further work up per admitting team  4. Myoclonus: D/t meds, severe (cefepime, gabapentin and narcotics).  5. Hyperkalemia: On admit, resolved with dialysis. 6. HTN/volume: BP controlled, euvolemic on exam with up per weights, UF as tolerated tomorrow. 7. Anemia of ESRD: Hgb 8.2 - continue max dose Aranesp 2044m weekly while here. 8. Secondary HPTH: CorrCa ok, Phos high - restarted her usual outpatient binder - Fosrenol '1000mg'$  TID.  9. Nutrition: Alb low, continue supplements. 10. 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 [facility] - op note described 1 cup liquid being removed. Per our imaging her, size appears the same which is unfortunate. re-cultured 11/2 - NGTD. 11. T2DM with severe gastroparesis: Ongoing issue, s/p esophageal myotomy 09/17/21. Has issues swallow/keeping pills down. 12. L foot pain; Xray negative, uric acid normal. VVS consulted, s/p LE angiography tomorrow.    SaAnice PaganiniPA-C 11/17/2021, 8:31 AM  [facility] Pager: [phone]

## 2021-11-17 NOTE — Progress Notes (Signed)
Mobility Specialist - Progress Note   11/17/21 1201  Mobility  Activity Transferred from chair to bed  Level of Assistance +2 (takes two people)  Assistive Device Stedy  Activity Response Tolerated well  Mobility Referral Yes  $Mobility charge 1 Mobility   Pt received in chair requesting to go to bed. Pt was MaxA +2 to stand from chair. Pt is limited with mobility d/t LLE pain. Pt was left in bed with all needs met.   Larey Seat

## 2021-11-17 NOTE — Procedures (Signed)
AUDIOLOGICAL  EVALUATION  NAME: Susan Horton     DOB:   [birth year]      MRN: [id number]                                                                                     DATE: 11/17/2021     DIAGNOSIS: Sensorineural hearing loss, bilateral    History: Susan Horton was seen for an audiological evaluation at bedside due to sudden decreased hearing loss in both ears. Susan Horton is currently in the hospital at [facility]. Her medical history is significant for  former smoker, with end-stage renal disease on HD, CAD, hypertension with systolic and diastolic CHF, diabetes complicated by peripheral neuropathy and gastroparesis, lower extremity DVT, asthma.  She underwent recent excision of tumoral calcinosis left hip/thigh, drain and sutures removed on 10/20/2021 without any evidence of infection per office notes. She presented to the ED on 11/08/21 for progressive fatigue and weakness after fall on 10/27. She was admitted for septic shock with unclear origin.   Susan Horton reports sudden hearing loss occurring in both ears overnight. She denies otalgia, tinnitus, and dizziness. Susan Horton reports bilateral aural fullness. Susan Horton denies hearing concerns prior to today. An Audiometric evaluation was completed today. Tympanometry could not be completed today due to equipment issues.   Patient Active Problem List   Diagnosis Date Noted   Sensorineural hearing loss (SNHL) of both ears 11/17/2021   Pressure injury of skin 59/93/5701   Acute metabolic encephalopathy    Severe sepsis (HCC)    Cellulitis    Anemia due to chronic kidney disease, on chronic dialysis ([place])    Altered mental status    Sepsis ([place]) 11/08/2021   Diabetes mellitus ([place]) 01/29/2021   Hypertension associated with stage 5 chronic kidney disease due to type 2 diabetes mellitus ([facility]) 01/29/2021   Tumoral calcinosis 01/29/2021   Dysphagia    Nausea 09/12/2019   Diarrhea, unspecified 09/12/2019   Pruritus, unspecified 08/13/2019   Anaphylactic  shock, unspecified, sequela 08/13/2019   Allergy, unspecified, sequela 08/13/2019   Unspecified cataract 03/19/2019   Unspecified asthma with (acute) exacerbation 03/19/2019   COVID-19 03/06/2019   Intractable nausea and vomiting 02/24/2019   Abdominal pain, epigastric 02/24/2019   Pneumonia of right lung due to infectious organism 02/24/2019   Shortness of breath 01/19/2019   Cerebrovascular small vessel disease    Generalized weakness    Abscess of sacrum (HCC)    Gluteal abscess 12/25/2018   Moderate protein-calorie malnutrition ([place]) 12/19/2018   Other disorders resulting from impaired renal tubular function 12/19/2018   Liver disease, unspecified 12/19/2018   Hyperkalemia 12/19/2018   Acidosis 12/19/2018   Hyperlipidemia 11/10/2018   History of arteriosclerotic vascular disease 11/10/2018   Carotid stenosis 11/10/2018   Symptomatic anemia 09/16/2018   Peripheral vascular disease, unspecified ([place]) 09/04/2018   Other disorders of bilirubin metabolism 01/20/2018   Other long term (current) drug therapy 11/28/2017   Other abnormal findings in urine 11/28/2017   Dependence on renal dialysis ([place]) 11/28/2017   ESRD on peritoneal dialysis ([place]) 77/93/9030   Complication of vascular dialysis catheter 09/15/2017   History of anemia due to CKD 07/18/2017  DVT (deep vein thrombosis) in pregnancy 07/18/2017   Encounter for removal of sutures 07/07/2017   Iron deficiency anemia, unspecified 06/30/2017   Secondary hyperparathyroidism of renal origin ([place]) 06/24/2017   Pain, unspecified 06/24/2017   Fever, unspecified 06/24/2017   ESRD (end stage renal disease) ([place]) 06/24/2017   Coagulation defect, unspecified ([place]) 06/24/2017   Anemia in chronic kidney disease 06/24/2017   Chronic diastolic CHF (congestive heart failure) ([place]) 06/18/2017   Moderate persistent asthma 06/10/2017   Adjustment disorder with depressed mood 09/08/2016   CKD (chronic kidney disease) stage 4, GFR 15-29  ml/min (HCC)    CAD S/P mLAD PCI with DES 08/06/2016   Presence of drug coated stent in LAD coronary artery 08/06/2016   Atherosclerotic heart disease of native coronary artery without angina pectoris 07/11/2016   Carotid bruit present 06/15/2016   Dyslipidemia 06/15/2016   Smoker 06/15/2016   Neuropathy 03/10/2016   Essential hypertension, benign 12/11/2015   Left facial pain 03/04/2015   Abnormal sense of taste 03/04/2015   Gastroparesis    Uncontrolled type 2 diabetes mellitus with peripheral neuropathy 06/05/2014   Yeast infection of the vagina 07/10/2010    Evaluation:  Otoscopy showed a clear view of the tympanic membranes, bilaterally Audiometric testing was completed using Conventional Audiometry techniques with insert earphones and TDH headphones. Test results are consistent with a mild to moderate sensorineural hearing loss, bilaterally. Test reliability is considered good reliability.      Results:  The test results were reviewed with Susan Horton. Today's evaluation is consistent with a mild to moderate sensorineural hearing loss, bilaterally. Susan Horton will have communication difficulty in all listening environments. She will benefit from the use of good communication strategies, the use of personal amplification devices, and hearing aids. Susan Horton was given a Personnel officer (personal amplification device) form the Acute Rehab Department to utilize during her stay at the hospital. The use of hearing aids was briefly reviewed.   Recommendations: 1.   Use of good communication strategies and the Pocket Talker during hospital stay. Pocket talker is to be returned to Acute Rehab upon discharge.  2.   Monitor hearing sensitivity 3.   After discharge, a communication needs assessment is recommended to discuss hearing aids and personal amplification devices.    45 minutes spent testing and counseling on results.   If you have any questions please feel free to contact me at [phone]  [phone].  Bari Mantis Audiologist, Au.D., CCC-A 11/17/2021  4:27 PM

## 2021-11-17 NOTE — Progress Notes (Signed)
Occupational Therapy Treatment Patient Details Name: Susan Horton MRN: [id number] DOB: 04-22-1970 Today's Date: 11/17/2021   History of present illness 51 y/o female presented to ED on 11/08/21 for progressive fatigue and weakness after fall on 10/27. Admitted for septic shock with unclear origin. Recent L hip tumoral calcinosis surgery a few weeks ago at [facility] with associated drainage from L hip. Underwent  stent of left common iliac artery with atherectomy and DCB of the left common femoral artery and stent of the left SFA in the setting of multilevel high-grade stenosis with rest pain on 11/6.  PMH: ESRD, CAD, HTN, CHF, T2DM with peripheral neuropathy and gastroparesis.   OT comments  Patient making good gains this treatment session with patient requiring min assist to get to EOB with increased time due to pain. Patient attempted to donn sock on RLE but was unable to reach and painful to donn over LLE with max assist. Patient performed stand into stedy with max assist +2 and performed 3 other stands from [employer] pads with mod assist of 2 for first 2 and min assist 2 for third. Patient provided red therapy band and instructed on BUE strengthening exercises to assist with sit to stands and transfers. Patient to continue to be followed by acute OT.    Recommendations for follow up therapy are one component of a multi-disciplinary discharge planning process, led by the attending physician.  Recommendations may be updated based on patient status, additional functional criteria and insurance authorization.    Follow Up Recommendations  Acute inpatient rehab (3hours/day)    Assistance Recommended at Discharge Frequent or constant Supervision/Assistance  Patient can return home with the following  A lot of help with bathing/dressing/bathroom;Assist for transportation;Help with stairs or ramp for entrance   Equipment Recommendations  Wheelchair (measurements OT);Wheelchair cushion (measurements OT)     Recommendations for Other Services      Precautions / Restrictions Precautions Precautions: Fall Precaution Comments: recent hip sx to L hip; left foot drop and bil hearing loss noted 11/7 Restrictions Weight Bearing Restrictions: No       Mobility Bed Mobility Overal bed mobility: Needs Assistance Bed Mobility: Supine to Sit, Rolling Rolling: Min assist   Supine to sit: HOB elevated, Min assist     General bed mobility comments: increased time due to pain    Transfers Overall transfer level: Needs assistance Equipment used: Ambulation equipment used Transfers: Sit to/from Stand, Bed to chair/wheelchair/BSC Sit to Stand: +2 physical assistance, +2 safety/equipment, From elevated surface, Mod assist, Max assist           General transfer comment: Patient stood from EOB with max assist of 2 into [place]. Patient performed 3 stands from [employer] with mod assist +2 for first 2 attempts and min assist of 2 for second attempt Transfer via Lift Equipment: Stedy   Balance Overall balance assessment: Needs assistance Sitting-balance support: Feet supported, No upper extremity supported Sitting balance-Leahy Scale: Fair Sitting balance - Comments: Can sit EOB on her own.  Tried to put sock on right foot but couldnt.   Standing balance support: Bilateral upper extremity supported, During functional activity Standing balance-Leahy Scale: Poor Standing balance comment: reliant on Stedy for support                           ADL either performed or assessed with clinical judgement   ADL Overall ADL's : Needs assistance/impaired  Lower Body Dressing: Maximal assistance Lower Body Dressing Details (indicate cue type and reason): patient attempted to donn sock on RLE but was unable to reach               General ADL Comments: focused on transfer to recliner and UE HEP    Extremity/Trunk Assessment Upper Extremity Assessment Upper  Extremity Assessment: Defer to OT evaluation   Lower Extremity Assessment Lower Extremity Assessment: LLE deficits/detail;RLE deficits/detail RLE Deficits / Details: grossly 3-/5 LLE Deficits / Details: 1/5 plantar flexion and 0/5 dorsiflexion, knee 2-/5, hip 2-/5; ROM hip, knee and ankle limited by pain   Cervical / Trunk Assessment Cervical / Trunk Assessment: Normal    Vision       Perception     Praxis      Cognition Arousal/Alertness: Awake/alert Behavior During Therapy: WFL for tasks assessed/performed Overall Cognitive Status: Within Functional Limits for tasks assessed Area of Impairment: Orientation, Attention, Memory, Following commands, Safety/judgement, Awareness, Problem solving                 Orientation Level: Disoriented to, Time, Place, Situation Current Attention Level: Focused Memory: Decreased short-term memory Following Commands: Follows one step commands with increased time, Follows one step commands inconsistently Safety/Judgement: Decreased awareness of safety, Decreased awareness of deficits Awareness: Intellectual Problem Solving: Slow processing, Decreased initiation, Difficulty sequencing, Requires verbal cues, Requires tactile cues General Comments: alert and oriented with newly [facility].  Eager to participate        Exercises Exercises: General Upper Extremity General Exercises - Upper Extremity Shoulder ABduction: Strengthening, Both, 15 reps, Seated, Theraband Theraband Level (Shoulder Abduction): Level 2 (Red) Elbow Flexion: Strengthening, Both, 15 reps, Seated, Theraband Theraband Level (Elbow Flexion): Level 2 (Red) Elbow Extension: Strengthening, Both, 15 reps, Seated, Theraband Theraband Level (Elbow Extension): Level 2 (Red)    Shoulder Instructions       General Comments      Pertinent Vitals/ Pain       Pain Assessment Pain Assessment: Faces Faces Pain Scale: Hurts even more Pain Location: Lt foot Pain Descriptors /  Indicators: Grimacing, Guarding Pain Intervention(s): Limited activity within patient's tolerance, Monitored during session, Repositioned  Home Living Family/patient expects to be discharged to:: Private residence Living Arrangements: Non-relatives/Friends Available Help at Discharge: Family;Available 24 hours/day Type of Home: House Home Access: Level entry     Home Layout: One level     Bathroom Shower/Tub: Teacher, early years/pre: Standard Bathroom Accessibility: Yes   Home Equipment: Rollator (4 wheels);Cane - single point   Additional Comments: Patient wil be stayig at sister's home initially.  Above information is sister.      Prior Functioning/Environment              Frequency  Min 2X/week        Progress Toward Goals  OT Goals(current goals can now be found in the care plan section)  Progress towards OT goals: Progressing toward goals  Acute Rehab OT Goals Patient Stated Goal: get better OT Goal Formulation: With patient/family Time For Goal Achievement: 11/26/21 Potential to Achieve Goals: Good ADL Goals Pt Will Perform Eating: sitting;with min assist Pt Will Perform Grooming: with min assist;sitting Pt Will Perform Upper Body Dressing: with min assist;sitting Pt Will Transfer to Toilet: with mod assist;stand pivot transfer;bedside commode Pt/caregiver will Perform Home Exercise Program: Increased strength;Both right and left upper extremity;With minimal assist  Plan Discharge plan remains appropriate    Co-evaluation    PT/OT/SLP Co-Evaluation/Treatment: Yes  Reason for Co-Treatment: Complexity of the patient's impairments (multi-system involvement);For patient/therapist safety PT goals addressed during session: Mobility/safety with mobility OT goals addressed during session: ADL's and self-care      AM-PAC OT "6 Clicks" Daily Activity     Outcome Measure   Help from another person eating meals?: A Little Help from another  person taking care of personal grooming?: A Little Help from another person toileting, which includes using toliet, bedpan, or urinal?: A Lot Help from another person bathing (including washing, rinsing, drying)?: A Lot Help from another person to put on and taking off regular upper body clothing?: A Lot Help from another person to put on and taking off regular lower body clothing?: A Lot 6 Click Score: 14    End of Session Equipment Utilized During Treatment: Gait belt;Other (comment) Charlaine Dalton)  OT Visit Diagnosis: Unsteadiness on feet (R26.81);Other symptoms and signs involving cognitive function;History of falling (Z91.81);Muscle weakness (generalized) (M62.81)   Activity Tolerance Patient tolerated treatment well   Patient Left in chair;with call bell/phone within reach;with chair alarm set;with family/visitor present   Nurse Communication Mobility status;Need for lift equipment        Time: 7414-2395 OT Time Calculation (min): 35 min  Charges: OT General Charges $OT Visit: 1 Visit OT Treatments $Self Care/Home Management : 8-22 mins  Lodema Hong, [place]  Office [phone]   Trixie Dredge 11/17/2021, 1:27 PM

## 2021-11-17 NOTE — Plan of Care (Signed)
  Problem: Fluid Volume: Goal: Hemodynamic stability will improve Outcome: Progressing   Problem: Clinical Measurements: Goal: Diagnostic test results will improve Outcome: Progressing Goal: Signs and symptoms of infection will decrease Outcome: Progressing   Problem: Respiratory: Goal: Ability to maintain adequate ventilation will improve Outcome: Progressing   Problem: Education: Goal: Ability to describe self-care measures that may prevent or decrease complications (Diabetes Survival Skills Education) will improve Outcome: Progressing Goal: Individualized Educational Video(s) Outcome: Progressing   Problem: Coping: Goal: Ability to adjust to condition or change in health will improve Outcome: Progressing   Problem: Fluid Volume: Goal: Ability to maintain a balanced intake and output will improve Outcome: Progressing   Problem: Health Behavior/Discharge Planning: Goal: Ability to identify and utilize available resources and services will improve Outcome: Progressing Goal: Ability to manage health-related needs will improve Outcome: Progressing   Problem: Metabolic: Goal: Ability to maintain appropriate glucose levels will improve Outcome: Progressing   Problem: Nutritional: Goal: Maintenance of adequate nutrition will improve Outcome: Progressing Goal: Progress toward achieving an optimal weight will improve Outcome: Progressing   Problem: Skin Integrity: Goal: Risk for impaired skin integrity will decrease Outcome: Progressing   Problem: Tissue Perfusion: Goal: Adequacy of tissue perfusion will improve Outcome: Progressing   Problem: Education: Goal: Knowledge of General Education information will improve Description: Including pain rating scale, medication(s)/side effects and non-pharmacologic comfort measures Outcome: Progressing   Problem: Health Behavior/Discharge Planning: Goal: Ability to manage health-related needs will improve Outcome:  Progressing   Problem: Clinical Measurements: Goal: Ability to maintain clinical measurements within normal limits will improve Outcome: Progressing Goal: Will remain free from infection Outcome: Progressing Goal: Diagnostic test results will improve Outcome: Progressing Goal: Respiratory complications will improve Outcome: Progressing Goal: Cardiovascular complication will be avoided Outcome: Progressing   Problem: Activity: Goal: Risk for activity intolerance will decrease Outcome: Progressing   Problem: Nutrition: Goal: Adequate nutrition will be maintained Outcome: Progressing   Problem: Coping: Goal: Level of anxiety will decrease Outcome: Progressing   Problem: Elimination: Goal: Will not experience complications related to bowel motility Outcome: Progressing Goal: Will not experience complications related to urinary retention Outcome: Progressing   Problem: Pain Managment: Goal: General experience of comfort will improve Outcome: Progressing   Problem: Safety: Goal: Ability to remain free from injury will improve Outcome: Progressing   Problem: Skin Integrity: Goal: Risk for impaired skin integrity will decrease Outcome: Progressing   Problem: Education: Goal: Understanding of CV disease, CV risk reduction, and recovery process will improve Outcome: Progressing Goal: Individualized Educational Video(s) Outcome: Progressing   Problem: Activity: Goal: Ability to return to baseline activity level will improve Outcome: Progressing   Problem: Cardiovascular: Goal: Ability to achieve and maintain adequate cardiovascular perfusion will improve Outcome: Progressing Goal: Vascular access site(s) Level 0-1 will be maintained Outcome: Progressing   Problem: Health Behavior/Discharge Planning: Goal: Ability to safely manage health-related needs after discharge will improve Outcome: Progressing

## 2021-11-17 NOTE — Progress Notes (Addendum)
[facility]  Subjective  - No motor and continued pain in the left LE   Objective (!) 100/50 84 99.1 F (37.3 C) (Oral) 16 94%  Intake/Output Summary (Last 24 hours) at 11/17/2021 0736 Last data filed at 11/16/2021 1200 Gross per 24 hour  Intake --  Output 2500 ml  Net -2500 ml    Doppler right DP, left PT signals No motor on the left foot or ankle.  Pain to palpation, very sensitive.  Left foot drop Right groin soft without hematoma   Assessment/Planning: 51 year old female with history of left external iliac artery and left SFA stenting for short distance claudication.  She has now been admitted with left hip infection and has severe rest pain in the left lower extremity with concern for occlusion of her stents   POD # 1 Stent of right common iliac artery 7 x 29 mm VBX  Stent of left common iliac artery with 7 x 59 mm VBX  Drug-coated balloon angioplasty left common femoral artery with 6 x 40 mm [place]  Stent of left SFA with proximal 5 x 140 mm Cook Zilver and distal 5 x 80 mm Cook Zilver   Elevation of B LE to control edema   Continue ASA and Plavix daily    Roxy Horseman 11/17/2021 7:36 AM --  Laboratory Lab Results: Recent Labs    11/15/21 0547 11/16/21 0500  WBC 17.0* 17.3*  HGB 8.1* 8.2*  HCT 28.0* 28.4*  PLT 500* 512*   BMET Recent Labs    11/16/21 0500 11/16/21 0550  NA 135 136  K 4.3 4.4  CL 92* 95*  CO2 23 24  GLUCOSE 94 94  BUN 46* 46*  CREATININE 6.60* 6.60*  CALCIUM 8.7* 8.7*    COAG Lab Results  Component Value Date   INR 1.3 (H) 11/09/2021   INR 1.2 11/08/2021   INR 1.1 01/09/2019   No results found for: "PTT"  I have seen and evaluated the patient. I agree with the PA note as documented above.  Postop day 1 status post stent of left common iliac artery with atherectomy and DCB of the left common femoral artery and stent of the left SFA in the setting of multilevel high-grade stenosis  with rest pain.  Wanted to avoid open operative intervention given infected left hip.  She has a brisk triphasic PT signal at the left ankle today.  Still has a foot drop that has been present for about a week with limited motor and sensation.  I am unclear about the etiology for this as when she was seen in consultation over the weekend she still had Doppler flow at the ankle.  Needs foot drop splint with aggressive therapy.  Aspirin Plavix from vascular standpoint.  We will follow.  [name], MD [facility] Office: [phone]

## 2021-11-17 NOTE — Progress Notes (Addendum)
PROGRESS NOTE    Susan Horton  HYQ:[id number] DOB: 09/06/1970 DOA: 11/08/2021 PCP: Charlott Rakes, MD    Brief Narrative:  51 year old woman, former smoker, with end-stage renal disease on HD, CAD, hypertension with systolic and diastolic CHF, diabetes complicated by peripheral neuropathy and gastroparesis, lower extremity DVT, asthma.  She underwent recent excision of tumoral calcinosis left hip/thigh, drain and sutures removed on 10/20/2021 without any evidence of infection per office notes.    She was brought to the emergency department 10/29 with 3 days of progressive fatigue, weakness.  She apparently suffered a mechanical fall on 10/27 with resultant R hip pain.  In the ED noted to be febrile, lethargic/confused, tachycardic.  Slowly improving in mentation. Stay complicated by left foot pain needing angiogram.  And now left foot drop and hearing impairment.   Assessment and Plan:  Acute encephalopathy, suspect metabolic Myoclonus -? Medication related -resolved  Sudden hearing decrease-- does not appear to be complete loss -overnight -have ordered hearing test and typanometry-- may need ENT eval vs MRI/CT scan pending these results -visualization of canal essentially clear-- no bulging of TM -IV abx d/c'd  Left foot pain -uric acid normal -x ray without fracture -vascular consult:  left-sided SFA stent may be occluded due to high-grade stenosis and recent pressor requirement at time of admission  -s/p angiogram with improvement in pain -now with foot drop on left side- AFO recommended    Severe sepsis -improved shock.  - concerned for possible bacteremia in patient with vascular access, on HD.  but  no cultures done iniailly --- t later cultures NGTD -CT Hip with some soft tissue edema which might reflect mild cellulitis. Wound draining-- culture done - NGTD -d/c abx   Biventricular heart failure Severe Tricuspid regurg - new Pulmonary hypertension CAD s/p mLAD PCI w  DES (2018) PVD  HLD HTN -in 2021 had normal LVEF and g2dd, mild TR. This admission LVEF 45-50, LV global hypokinesis. RV moderately reduced systolic fxn, moderately enlarged RV size, mildly elevated PASP. RVSP 43    P -DAPT  -volume management per RRT  -cards consulted: monitor   Acute on chronic renal failure, ESRD on HD.  Only had a partial HD run on 10/27.   Hyperkalemia Hyperphosphatemia  Uremia -HD 11/3 and again 11/6  Anemia  -trend -transfuse for <7   Asthma without exacerbation  -breo ellipta, PRN duoneb, singulair   DM2/Diabetic neuropathy /Gastroparesis  -SSI -gabapentin- d/c'd   Tumoral calcinosis, recent left thigh/hip surgery -Wound care  -discharge cultures and NGTD -may need further imaging if worsens otherwise will need to follow up at [facility]    DVT prophylaxis: heparin injection 5,000 Units Start: 11/16/21 2200    Code Status: Full Code Family Communication: at bedside  Disposition Plan:  Level of care: Progressive Status is: Inpatient Remains inpatient appropriate because: PT eval-- may need CIR  Consultants:  PCCM Renal vascular   Subjective: This AM awoke with trouble hearing-- from both ears-- no sinus drainage  Objective: Vitals:   11/16/21 1800 11/16/21 2110 11/17/21 0453 11/17/21 0950  BP: (!) 165/61 (!) 141/55 (!) 100/50 (!) 112/94  Pulse: 93 93 84   Resp: '19 18 16   '$ Temp:  99.3 F (37.4 C) 99.1 F (37.3 C)   TempSrc:  Oral Oral   SpO2: 96% 99% 94% 99%  Weight:   66 kg    No intake or output data in the 24 hours ending 11/17/21 [place]  11/16/21 0748 11/16/21 1646 11/17/21 0453  Weight: 66.8 kg 64.4 kg 66 kg    Examination:     General: Appearance:    Chronically ill appearing female in no acute distress     Lungs:     respirations unlabored  Heart:    Normal heart rate. Normal rhythm. No murmurs, rubs, or gallops.   MS:   All extremities are intact.   Neurologic:   Awake, alert        Data Reviewed: I have personally reviewed following labs and imaging studies  CBC: Recent Labs  Lab 11/11/21 0256 11/12/21 0512 11/15/21 0547 11/16/21 0500 11/17/21 0821  WBC 13.0* 13.0* 17.0* 17.3* 17.6*  HGB 7.6* 7.5* 8.1* 8.2* 7.5*  HCT 25.9* 25.4* 28.0* 28.4* 25.5*  MCV 80.4 80.4 81.2 82.3 80.2  PLT 327 376 500* 512* 403*   Basic Metabolic Panel: Recent Labs  Lab 11/11/21 0256 11/12/21 0512 11/13/21 0148 11/14/21 0205 11/15/21 0547 11/16/21 0500 11/16/21 0550 11/17/21 0821  NA 137  136   < > 135 137 132* 135 136 134*  K 5.2*  5.2*   < > 4.3 4.5 4.2 4.3 4.4 3.9  CL 103  102   < > 94* 93* 90* 92* 95* 95*  CO2 21*  21*   < > '25 25 26 23 24 25  '$ GLUCOSE 137*  137*   < > 94 86 97 94 94 145*  BUN 67*  72*   < > 48* 24* 36* 46* 46* 25*  CREATININE 7.62*  7.54*   < > 6.05* 3.90* 5.47* 6.60* 6.60* 4.86*  CALCIUM 8.0*  7.9*   < > 8.4* 9.0 8.4* 8.7* 8.7* 8.6*  MG 2.5*  --   --   --   --   --   --   --   PHOS 9.1*  9.0*   < > 7.0* 6.1* 7.2*  --  8.0* 7.2*   < > = values in this interval not displayed.   GFR: Estimated Creatinine Clearance: 12.3 mL/min (A) (by C-G formula based on SCr of 4.86 mg/dL (H)). Liver Function Tests: Recent Labs  Lab 11/13/21 0148 11/14/21 0205 11/15/21 0547 11/16/21 0550 11/17/21 0821  ALBUMIN 1.8* 1.9* 1.8* 1.8* 1.8*   No results for input(s): "LIPASE", "AMYLASE" in the last 168 hours. Recent Labs  Lab 11/11/21 1041  AMMONIA 30   Coagulation Profile: No results for input(s): "INR", "PROTIME" in the last 168 hours.  Cardiac Enzymes: Recent Labs  Lab 11/11/21 1041  CKTOTAL 38   BNP (last 3 results) No results for input(s): "PROBNP" in the last 8760 hours. HbA1C: No results for input(s): "HGBA1C" in the last 72 hours.  CBG: Recent Labs  Lab 11/15/21 1602 11/15/21 2133 11/16/21 1312 11/16/21 2109 11/17/21 0826  GLUCAP 112* 122* 86 89 135*   Lipid Profile: No results for input(s): "CHOL", "HDL", "LDLCALC",  "TRIG", "CHOLHDL", "LDLDIRECT" in the last 72 hours. Thyroid Function Tests: No results for input(s): "TSH", "T4TOTAL", "FREET4", "T3FREE", "THYROIDAB" in the last 72 hours. Anemia Panel: No results for input(s): "VITAMINB12", "FOLATE", "FERRITIN", "TIBC", "IRON", "RETICCTPCT" in the last 72 hours.  Sepsis Labs: Recent Labs  Lab 11/11/21 0824 11/12/21 0512 11/13/21 0148  PROCALCITON 25.41 21.71 18.01    Recent Results (from the past 240 hour(s))  SARS Coronavirus 2 by RT PCR (hospital order, performed in [facility] hospital lab) *cepheid single result test* Anterior Nasal Swab     Status: None   Collection Time: 11/08/21  9:04 AM   Specimen: Anterior Nasal Swab  Result Value Ref Range Status   SARS Coronavirus 2 by RT PCR NEGATIVE NEGATIVE Final    Comment: (NOTE) SARS-CoV-2 target nucleic acids are NOT DETECTED.  The SARS-CoV-2 RNA is generally detectable in upper and lower respiratory specimens during the acute phase of infection. The lowest concentration of SARS-CoV-2 viral copies this assay can detect is 250 copies / mL. A negative result does not preclude SARS-CoV-2 infection and should not be used as the sole basis for treatment or other patient management decisions.  A negative result may occur with improper specimen collection / handling, submission of specimen other than nasopharyngeal swab, presence of viral mutation(s) within the areas targeted by this assay, and inadequate number of viral copies (<250 copies / mL). A negative result must be combined with clinical observations, patient history, and epidemiological information.  Fact Sheet for Patients:   https://www.patel.info/  Fact Sheet for Healthcare Providers: https://hall.com/  This test is not yet approved or  cleared by the [place] FDA and has been authorized for detection and/or diagnosis of SARS-CoV-2 by FDA under an Emergency Use Authorization (EUA).   This EUA will remain in effect (meaning this test can be used) for the duration of the COVID-19 declaration under Section 564(b)(1) of the Act, 21 U.S.C. section 360bbb-3(b)(1), unless the authorization is terminated or revoked sooner.  Performed at [facility], [place] [address] [place], [place] [address]   MRSA Next Gen by PCR, Nasal     Status: None   Collection Time: 11/08/21  3:14 PM   Specimen: Nasal Mucosa; Nasal Swab  Result Value Ref Range Status   MRSA by PCR Next Gen NOT DETECTED NOT DETECTED Final    Comment: (NOTE) The GeneXpert MRSA Assay (FDA approved for NASAL specimens only), is one component of a comprehensive MRSA colonization surveillance program. It is not intended to diagnose MRSA infection nor to guide or monitor treatment for MRSA infections. Test performance is not FDA approved in patients less than 23 years old. Performed at [facility], [place] [address] [place], [place] [address]   Culture, blood (Routine X 2) w Reflex to ID Panel     Status: None   Collection Time: 11/12/21 11:47 AM   Specimen: BLOOD RIGHT WRIST  Result Value Ref Range Status   Specimen Description BLOOD RIGHT WRIST  Final   Special Requests   Final    BOTTLES DRAWN AEROBIC AND ANAEROBIC Blood Culture adequate volume   Culture   Final    NO GROWTH 5 DAYS Performed at [facility], [address] [place], [place] [address]    Report Status 11/17/2021 FINAL  Final  Culture, blood (Routine X 2) w Reflex to ID Panel     Status: None   Collection Time: 11/12/21 12:00 PM   Specimen: BLOOD RIGHT WRIST  Result Value Ref Range Status   Specimen Description BLOOD RIGHT WRIST  Final   Special Requests   Final    BOTTLES DRAWN AEROBIC AND ANAEROBIC Blood Culture adequate volume   Culture   Final    NO GROWTH 5 DAYS Performed at [facility], [place] [address], [place], [place] [address]    Report Status 11/17/2021 FINAL  Final  Aerobic Culture w Gram  Stain (superficial specimen)     Status: None   Collection Time: 11/12/21  2:25 PM   Specimen: Wound  Result Value Ref Range Status  Specimen Description WOUND HIP  Final   Special Requests  LEFT  Final   Gram Stain NO WBC SEEN NO ORGANISMS SEEN   Final   Culture   Final    NO GROWTH 2 DAYS Performed at [facility], [address], [place], [place] [address]    Report Status 11/14/2021 FINAL  Final         Radiology Studies: PERIPHERAL VASCULAR CATHETERIZATION  Result Date: 11/16/2021 Images from the original result were not included. 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 [name], MD Procedure Performed: 1.  Also guided cannulation right common femoral artery 2.  Stent of right common iliac artery 7 x 29 mm VBX 3.  Aortogram with left lower extremity angiogram 4.  Stent of left common iliac artery with 7 x 59 mm VBX 5.  Jetstream atherectomy left common femoral artery and SFA 6.  Drug-coated balloon angioplasty left common femoral artery with 6 x 40 mm Ranger 7.  Stent of left SFA with proximal 5 x 140 mm [place] and distal 5 x 80 mm [place] 8. Moderate sedation with fentanyl and Versed for 101 minutes Indications: 51 year old female with history of left external iliac artery and left SFA stenting for short distance claudication.  She has now been admitted with left hip infection and has severe rest pain in the left lower extremity with concern for occlusion of her stents and is indicated for angiography with possible intervention. Findings: The aorta and iliac segments were heavily calcified.  I could actually not pass the right common iliac artery without stenting first.  Aortogram demonstrated a calcified artery.  Left common femoral artery was subtotally occluded at least 95% stenosed and  there was a 90% stenosis in the proximal SFA.  The profunda was patent.  The existing SFA stent existing external iliac artery stents were patent.  The left SFA distal to the stents was diseased with subtotal occlusion at least 95% stenosis.  There was single-vessel runoff via the posterior tibial artery which did fill the foot.  After atherectomy and stenting and drug-coated balloon angioplasty there was brisk flow through the common femoral artery and SFA down to the posterior tibial artery.  There was still common iliac artery lesion on the left this was primarily stented and the distal pressure did improve 40 mmHg at the common femoral artery after stenting.  There was a very strong posterior tibial signal at completion.  Procedure:  The patient was identified in the holding area and taken to room 8.  The patient was then placed supine on the table and prepped and draped in the usual sterile fashion.  A time out was called.  Ultrasound was used to evaluate the right common femoral artery which was heavily diseased.  The area was anesthetized 1% lidocaine cannulated by puncture needle followed by wire and sheath.  Ultrasound guidance was used and images saved the permanent record.  Concomitantly we administered fentanyl and Versed and her vital signs were monitored throughout the case.  A Bentson wire was placed followed by 5 [place] sheath.  Of Berenstein catheter was used to cross the high-grade stenosis in the common iliac artery on the right and this was primarily stented with a 7 x 29 mm VBX.  Aortogram was then performed.  I did cross the bifurcation using Glidewire advantage  and crossover catheter and then placed a long 7 French sheath over the bifurcation had to perform a left lower extremity angiography and the patient was fully heparinized prior to placing a 7 x 29 mm VBX.  We then were able to cross all of the occluded common femoral artery and stenosis in the SFA all the way down to the popliteal  artery perform distal angiogram which demonstrated runoff via the posterior tibial to the foot.  I placed an 014 wire followed by a Nava 6 filter.  We then performed jetstream atherectomy the common femoral artery and proximal SFA followed by balloon angioplasty of the common femoral artery with 5 and 6 mm balloons followed by drug-coated balloon angioplasty of the common femoral artery.  The filter was then removed and I exchanged for a separate 014 wire that was placed distally.  We then primarily stented the distal SFA with a 5 x 80 mm drug-eluting stent followed by 5 x 140 mm drug-eluting stent proximally all ballooned with 5 mm balloon.  Completion demonstrated brisk runoff via the SFA and the common femoral was patent.  At that time we had a systolic pressure of 75 and the sheath at the common femoral artery.  I elected to primarily stent the common iliac artery and this was primarily stented with a 7 x 59 mm VBX without incidence.  Completion demonstrated an improvement in systolic pressure at the common femoral artery to 115 with only a 15 mm gradient from the aorta at this time.  We then exchanged over the Amplatz wire for a short 7 French sheath and removed the Amplatz with quick cross catheter.  Retrograde imaging was performed of the right common femoral artery there was no active extravasation this will be pulled postoperative holding.  Patient tolerated procedure without any complication. Contrast: 130cc Brandon C. [name], MD [facility] Office: [phone] Pager: [phone]        Scheduled Meds:  (feeding supplement) PROSource Plus  30 mL Oral BID BM   aspirin  81 mg Oral Daily   Chlorhexidine Gluconate Cloth  6 each Topical Q0600   clopidogrel  75 mg Oral Q breakfast   darbepoetin (ARANESP) injection - NON-DIALYSIS  200 mcg Subcutaneous Q Mon-1800   doxercalciferol  7 mcg Intravenous Q M,W,F-HD   fluticasone furoate-vilanterol  1 puff Inhalation Daily    heparin  5,000 Units Subcutaneous Q8H   insulin aspart  0-5 Units Subcutaneous QHS   insulin aspart  0-6 Units Subcutaneous TID WC   lanthanum  1,000 mg Oral TID WC   lidocaine  1 patch Transdermal Q24H   metoprolol succinate  25 mg Oral Daily   montelukast  10 mg Oral QHS   pantoprazole (PROTONIX) IV  40 mg Intravenous QHS   sodium chloride flush  3 mL Intravenous Q12H   thiamine (VITAMIN B1) injection  100 mg Intravenous Daily   Continuous Infusions:  sodium chloride       LOS: 9 days    Time spent: 45 minutes spent on chart review, discussion with nursing staff, consultants, updating family and interview/physical exam; more than 50% of that time was spent in counseling and/or coordination of care.    Geradine Girt, DO [facility] Available via Epic secure chat 7am-7pm After these hours, please refer to coverage provider listed on [email or web address] 11/17/2021, 12:15 PM

## 2021-11-18 DIAGNOSIS — H903 Sensorineural hearing loss, bilateral: Secondary | ICD-10-CM

## 2021-11-18 DIAGNOSIS — N186 End stage renal disease: Secondary | ICD-10-CM | POA: Diagnosis not present

## 2021-11-18 DIAGNOSIS — G9341 Metabolic encephalopathy: Secondary | ICD-10-CM | POA: Diagnosis not present

## 2021-11-18 DIAGNOSIS — A419 Sepsis, unspecified organism: Secondary | ICD-10-CM | POA: Diagnosis not present

## 2021-11-18 DIAGNOSIS — L0390 Cellulitis, unspecified: Secondary | ICD-10-CM | POA: Diagnosis not present

## 2021-11-18 LAB — RENAL FUNCTION PANEL
Albumin: 1.8 g/dL — ABNORMAL LOW (ref 3.5–5.0)
Anion gap: 15 (ref 5–15)
BUN: 30 mg/dL — ABNORMAL HIGH (ref 6–20)
CO2: 25 mmol/L (ref 22–32)
Calcium: 8.6 mg/dL — ABNORMAL LOW (ref 8.9–10.3)
Chloride: 94 mmol/L — ABNORMAL LOW (ref 98–111)
Creatinine, Ser: 5.6 mg/dL — ABNORMAL HIGH (ref 0.44–1.00)
GFR, Estimated: 9 mL/min — ABNORMAL LOW (ref >60)
Glucose, Bld: 107 mg/dL — ABNORMAL HIGH (ref 70–99)
Phosphorus: 7.7 mg/dL — ABNORMAL HIGH (ref 2.5–4.6)
Potassium: 3.4 mmol/L — ABNORMAL LOW (ref 3.5–5.1)
Sodium: 134 mmol/L — ABNORMAL LOW (ref 135–145)

## 2021-11-18 LAB — GLUCOSE, CAPILLARY
Glucose-Capillary: 89 mg/dL (ref 70–99)
Glucose-Capillary: 103 mg/dL — ABNORMAL HIGH (ref 70–99)
Glucose-Capillary: 149 mg/dL — ABNORMAL HIGH (ref 70–99)

## 2021-11-18 MED ORDER — HYDROMORPHONE HCL 1 MG/ML IJ SOLN
0.5000 mg | INTRAMUSCULAR | Status: DC | PRN
  Administered 2021-11-18 – 2021-11-20 (×5): 0.5 mg via INTRAVENOUS
  Filled 2021-11-18 (×4): qty 1

## 2021-11-18 MED ORDER — OXYCODONE HCL 5 MG PO TABS
5.0000 mg | ORAL_TABLET | ORAL | Status: DC | PRN
  Administered 2021-11-18 – 2021-11-20 (×6): 10 mg via ORAL
  Filled 2021-11-18 (×6): qty 2

## 2021-11-18 NOTE — Progress Notes (Signed)
At bedside for PIV placement. Attempted x 1 with no success. Pt intolerable of stick. Catheter removed. No other suitable vessels detected for cannulation. L arm restriction. Currently has a temporary HD cath with pigtail. I recommend leaving central access at this time for meds and lab draws.

## 2021-11-18 NOTE — Progress Notes (Addendum)
  Inpatient Rehabilitation Admissions Coordinator   Met with patient and daughter at bedside for rehab assessment. We discussed goals and expectations of a possible CIR admit. They prefer CIR for rehab. Family can provide expected caregiver support that is recommended. I await bed availability to admit. Please call me with any questions.   Danne Baxter, RN, MSN Rehab Admissions Coordinator [phone]

## 2021-11-18 NOTE — Progress Notes (Signed)
Received patient in bed to unit.  Alert and oriented.  Informed consent signed and in chart.   Treatment initiated: 1945 Treatment completed: 2315  Patient tolerated well.  Transported back to the room  Alert, without acute distress.  Hand-off given to patient's nurse.   Access used: fistula Access issues: none  Total UF removed: 2000 Medication(s) given: none Post HD VS: 99.7, 138/64(86), HR-95, RR-15, SP02-99 Post HD weight: 63.9kg   [facility]

## 2021-11-18 NOTE — Progress Notes (Signed)
Physical Therapy Treatment Patient Details Name: Susan Horton MRN: [id number] DOB: December 08, 1970 Today's Date: 11/18/2021   History of Present Illness 51 y/o female presented to ED on 11/08/21 for progressive fatigue and weakness after fall on 10/27. Admitted for septic shock with unclear origin. Recent L hip tumoral calcinosis surgery a few weeks ago at [facility] with associated drainage from L hip. Underwent  stent of left common iliac artery with atherectomy and DCB of the left common femoral artery and stent of the left SFA in the setting of multilevel high-grade stenosis with rest pain on 11/6.  PMH: ESRD, CAD, HTN, CHF, T2DM with peripheral neuropathy and gastroparesis.    PT Comments    Pt with strong desire to return to PLOF. She is understanding of the intensity of ischemic pain, however is determined to do as much as she can. Pt reports need to use bed pan. When asked if she would like to transfer to [facility] instead pt quickly responds "yes". Pt is limited by L foot pain especially in increasingly dependent positions. Pt is min guard to min A for bed mobility and mod A to maxA for pivot transfer to and from [facility]. Pt is able to don socks and perform self pericare. Pt report her shoes are in room for AFO fitting however questions her ability to tolerate fitting for AFO at this time due to pain. PT in agreement that for ideal AFO fitting pain level should be at a level where she can tolerate weightbearing through L foot. MD, RN notified. Pt is hopeful for discharge to [facility]. PT in agreement with discharge plan. PT will continue to follow acutely.    Recommendations for follow up therapy are one component of a multi-disciplinary discharge planning process, led by the attending physician.  Recommendations may be updated based on patient status, additional functional criteria and insurance authorization.  Follow Up Recommendations  Acute inpatient rehab (3hours/day)     Assistance Recommended at Discharge  Frequent or constant Supervision/Assistance  Patient can return home with the following A lot of help with walking and/or transfers;A lot of help with bathing/dressing/bathroom;Assistance with cooking/housework;Direct supervision/assist for financial management;Direct supervision/assist for medications management;Assist for transportation;Help with stairs or ramp for entrance   Equipment Recommendations  Other (comment) (TBD)    Recommendations for Other Services Rehab consult     Precautions / Restrictions Precautions Precautions: Fall Precaution Comments: recent hip sx to L hip; left foot drop and bil hearing loss noted 11/7 Restrictions Weight Bearing Restrictions: No     Mobility  Bed Mobility Overal bed mobility: Needs Assistance Bed Mobility: Supine to Sit, Sit to Supine     Supine to sit: HOB elevated, Min guard Sit to supine: Min assist   General bed mobility comments: pt requires increased time and effort to come to EoB however is able to problem solve keeping L LE elevated to get to EoB, able to use bedrail to pull hips to EoB, min A for managing L LE back to bed however, once in supine pt able to bridge and scoot herself up in the bed    Transfers Overall transfer level: Needs assistance   Transfers: Bed to chair/wheelchair/BSC       Squat pivot transfers: Mod assist, Max assist     General transfer comment: with cuing for sequencing and hand placement, pt able to place L LE on pillow and use R LE to power up to squat and pivot to [facility] on her R with modA at hips,  pt requiring maxA for pivot back to bed due to increased pain, fatigue and transferring to L    Ambulation/Gait               General Gait Details: unable due to L foot pain with weightbearing         Balance Overall balance assessment: Needs assistance Sitting-balance support: Feet supported, No upper extremity supported Sitting balance-Leahy Scale: Fair Sitting balance - Comments: Can  sit EOB on her own.       Standing balance comment: did not come to fully standing today                            Cognition Arousal/Alertness: Awake/alert Behavior During Therapy: WFL for tasks assessed/performed Overall Cognitive Status: Within Functional Limits for tasks assessed                                 General Comments: cognition improved, understanding of progression of mobility despite pain           General Comments General comments (skin integrity, edema, etc.): Pt able to don R sock in supine and perform self pericare on BSC. Pt very pleased to have more independence with her self care      Pertinent Vitals/Pain Pain Assessment Pain Assessment: Faces Faces Pain Scale: Hurts whole lot Breathing: normal Negative Vocalization: none Facial Expression: smiling or inexpressive Body Language: relaxed Consolability: no need to console PAINAD Score: 0 Pain Location: Lt foot in dependent position Pain Descriptors / Indicators: Grimacing, Guarding, Moaning, Throbbing Pain Intervention(s): Limited activity within patient's tolerance, Monitored during session, Repositioned     PT Goals (current goals can now be found in the care plan section) Acute Rehab PT Goals Patient Stated Goal: get better and back to walking PT Goal Formulation: With patient/family Time For Goal Achievement: 12/01/21 Potential to Achieve Goals: Fair Progress towards PT goals: Progressing toward goals    Frequency    Min 3X/week      PT Plan Current plan remains appropriate       AM-PAC PT "6 Clicks" Mobility   Outcome Measure  Help needed turning from your back to your side while in a flat bed without using bedrails?: A Little Help needed moving from lying on your back to sitting on the side of a flat bed without using bedrails?: A Little Help needed moving to and from a bed to a chair (including a wheelchair)?: Total Help needed standing up from a  chair using your arms (e.g., wheelchair or bedside chair)?: Total Help needed to walk in hospital room?: Total Help needed climbing 3-5 steps with a railing? : Total 6 Click Score: 10    End of Session Equipment Utilized During Treatment: Gait belt Activity Tolerance: Patient limited by pain Patient left: with call bell/phone within reach;with family/visitor present;with chair alarm set;in bed Nurse Communication: Mobility status;Need for lift equipment (use Stedy or pivot with assist x2) PT Visit Diagnosis: Unsteadiness on feet (R26.81);Muscle weakness (generalized) (M62.81);History of falling (Z91.81);Difficulty in walking, not elsewhere classified (R26.2);Other symptoms and signs involving the nervous system (H41.740)     Time: 8144-8185 PT Time Calculation (min) (ACUTE ONLY): 43 min  Charges:  $Therapeutic Activity: 23-37 mins $Self Care/Home Management: 8-22                     Lynn Sissel B. [employer]  PT, DPT Acute Rehabilitation Services Please use secure chat or  Call Office [phone]    Susan Horton 11/18/2021, 4:14 PM

## 2021-11-18 NOTE — Progress Notes (Signed)
[facility] Progress Note   Subjective:   Foot pain and hearing loss both improved today. Pt denies SOB, CP, dizziness, abdominal pain and nausea.   Objective Vitals:   11/17/21 0453 11/17/21 0950 11/17/21 1616 11/18/21 0530  BP: (!) 100/50 (!) 112/94 (!) 112/41 (!) 114/44  Pulse: 84  79 80  Resp: '16  19 15  '$ Temp: 99.1 F (37.3 C)  97.7 F (36.5 C) 98.3 F (36.8 C)  TempSrc: Oral  Oral Oral  SpO2: 94% 99%  99%  Weight: 66 kg   65.7 kg   Physical Exam General: Alert female, NAD Heart: RRR, no murmurs, rubs or gallops Lungs: CTA bilaterally Abdomen: Non-distended, +BS Extremities: o peripheral edema Dialysis Access: LUE AVF + bruit    Additional Objective Labs: Basic Metabolic Panel: Recent Labs  Lab 11/16/21 0550 11/17/21 0821 11/18/21 0500  NA 136 134* 134*  K 4.4 3.9 3.4*  CL 95* 95* 94*  CO2 '24 25 25  '$ GLUCOSE 94 145* 107*  BUN 46* 25* 30*  CREATININE 6.60* 4.86* 5.60*  CALCIUM 8.7* 8.6* 8.6*  PHOS 8.0* 7.2* 7.7*   Liver Function Tests: Recent Labs  Lab 11/16/21 0550 11/17/21 0821 11/18/21 0500  ALBUMIN 1.8* 1.8* 1.8*   No results for input(s): "LIPASE", "AMYLASE" in the last 168 hours. CBC: Recent Labs  Lab 11/12/21 0512 11/15/21 0547 11/16/21 0500 11/17/21 0821  WBC 13.0* 17.0* 17.3* 17.6*  HGB 7.5* 8.1* 8.2* 7.5*  HCT 25.4* 28.0* 28.4* 25.5*  MCV 80.4 81.2 82.3 80.2  PLT 376 500* 512* 415*   Blood Culture    Component Value Date/Time   SDES WOUND HIP 11/12/2021 1425   SPECREQUEST  LEFT 11/12/2021 1425   CULT  11/12/2021 1425    NO GROWTH 2 DAYS Performed at [facility] [address], [place], [place] [address]    REPTSTATUS 11/14/2021 FINAL 11/12/2021 1425    Cardiac Enzymes: No results for input(s): "CKTOTAL", "CKMB", "CKMBINDEX", "TROPONINI" in the last 168 hours. CBG: Recent Labs  Lab 11/17/21 0826 11/17/21 1314 11/17/21 1621 11/17/21 2140 11/18/21 0832  GLUCAP 135* 184* 154* 130* 89   Iron  Studies: No results for input(s): "IRON", "TIBC", "TRANSFERRIN", "FERRITIN" in the last 72 hours. '@lablastinr3'$ @ Studies/Results: PERIPHERAL VASCULAR CATHETERIZATION  Result Date: 11/16/2021 Images from the original result were not included. 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 [name], MD Procedure Performed: 1.  Also guided cannulation right common femoral artery 2.  Stent of right common iliac artery 7 x 29 mm VBX 3.  Aortogram with left lower extremity angiogram 4.  Stent of left common iliac artery with 7 x 59 mm VBX 5.  Jetstream atherectomy left common femoral artery and SFA 6.  Drug-coated balloon angioplasty left common femoral artery with 6 x 40 mm Ranger 7.  Stent of left SFA with proximal 5 x 140 mm [place]ilver and distal 5 x 80 mm [place]ver 8. Moderate sedation with fentanyl and Versed for 101 minutes Indications: 51 year old female with history of left external iliac artery and left SFA stenting for short distance claudication.  She has now been admitted with left hip infection and has severe rest pain in the left lower extremity with concern for occlusion of her stents and is indicated for angiography with possible intervention. Findings: The aorta and iliac segments were heavily  calcified.  I could actually not pass the right common iliac artery without stenting first.  Aortogram demonstrated a calcified artery.  Left common femoral artery was subtotally occluded at least 95% stenosed and there was a 90% stenosis in the proximal SFA.  The profunda was patent.  The existing SFA stent existing external iliac artery stents were patent.  The left SFA distal to the stents was diseased with subtotal occlusion at least 95% stenosis.  There was single-vessel runoff via the posterior tibial artery which did fill the  foot.  After atherectomy and stenting and drug-coated balloon angioplasty there was brisk flow through the common femoral artery and SFA down to the posterior tibial artery.  There was still common iliac artery lesion on the left this was primarily stented and the distal pressure did improve 40 mmHg at the common femoral artery after stenting.  There was a very strong posterior tibial signal at completion.  Procedure:  The patient was identified in the holding area and taken to room 8.  The patient was then placed supine on the table and prepped and draped in the usual sterile fashion.  A time out was called.  Ultrasound was used to evaluate the right common femoral artery which was heavily diseased.  The area was anesthetized 1% lidocaine cannulated by puncture needle followed by wire and sheath.  Ultrasound guidance was used and images saved the permanent record.  Concomitantly we administered fentanyl and Versed and her vital signs were monitored throughout the case.  A Bentson wire was placed followed by 5 [place] sheath.  Of Berenstein catheter was used to cross the high-grade stenosis in the common iliac artery on the right and this was primarily stented with a 7 x 29 mm VBX.  Aortogram was then performed.  I did cross the bifurcation using Glidewire advantage and crossover catheter and then placed a long 7 French sheath over the bifurcation had to perform a left lower extremity angiography and the patient was fully heparinized prior to placing a 7 x 29 mm VBX.  We then were able to cross all of the occluded common femoral artery and stenosis in the SFA all the way down to the popliteal artery perform distal angiogram which demonstrated runoff via the posterior tibial to the foot.  I placed an 014 wire followed by a Nava 6 filter.  We then performed jetstream atherectomy the common femoral artery and proximal SFA followed by balloon angioplasty of the common femoral artery with 5 and 6 mm balloons followed by  drug-coated balloon angioplasty of the common femoral artery.  The filter was then removed and I exchanged for a separate 014 wire that was placed distally.  We then primarily stented the distal SFA with a 5 x 80 mm drug-eluting stent followed by 5 x 140 mm drug-eluting stent proximally all ballooned with 5 mm balloon.  Completion demonstrated brisk runoff via the SFA and the common femoral was patent.  At that time we had a systolic pressure of 75 and the sheath at the common femoral artery.  I elected to primarily stent the common iliac artery and this was primarily stented with a 7 x 59 mm VBX without incidence.  Completion demonstrated an improvement in systolic pressure at the common femoral artery to 115 with only a 15 mm gradient from the aorta at this time.  We then exchanged over the Amplatz wire for a short 7 French sheath and removed the Amplatz with quick cross catheter.  Retrograde  imaging was performed of the right common femoral artery there was no active extravasation this will be pulled postoperative holding.  Patient tolerated procedure without any complication. Contrast: 130cc Brandon C. [name], MD [facility] Office: [phone] Pager: [phone]   Medications:  sodium chloride      (feeding supplement) PROSource Plus  30 mL Oral BID BM   aspirin  81 mg Oral Daily   Chlorhexidine Gluconate Cloth  6 each Topical Q0600   clopidogrel  75 mg Oral Q breakfast   darbepoetin (ARANESP) injection - NON-DIALYSIS  200 mcg Subcutaneous Q Mon-1800   doxercalciferol  7 mcg Intravenous Q M,W,F-HD   fluticasone furoate-vilanterol  1 puff Inhalation Daily   heparin  5,000 Units Subcutaneous Q8H   insulin aspart  0-5 Units Subcutaneous QHS   insulin aspart  0-6 Units Subcutaneous TID WC   lanthanum  1,000 mg Oral TID WC   lidocaine  1 patch Transdermal Q24H   metoprolol succinate  25 mg Oral Daily   montelukast  10 mg Oral QHS   pantoprazole (PROTONIX) IV  40 mg  Intravenous QHS   sodium chloride flush  3 mL Intravenous Q12H   thiamine (VITAMIN B1) injection  100 mg Intravenous Daily    Outpatient Dialysis Orders: [facility] 3h 69mn 400/600  61kg  2/2 bath P2  Hep 4000  LUE AVF - mircera 200 mcg IV q2, last 10/16, due 10/30 - venofer '100mg'$  IV tiw thru 11/06 - doxercalciferol 7 ug tiw - sod thiosulfate 25 gm iv tiw    Assessment/Plan: Septic shock: Febrile - ?hip infection v. HCAP, was on Vanc/Cefepime/Flagyl -> finishing course of Zosyn. 2. ESRD: Required CRRT on admit due to AMS, high BUN, etc. Now back to iHD nd tolerating well. Uses L AVF for dialysis. 3. Encephalopathy: D/t shock/?meds -improved but now with decreased hearing and foot drop 11/7- now spontaneously improving. No offending meds identified.  4. Myoclonus: D/t meds, severe (cefepime, gabapentin and narcotics).  5. Hyperkalemia: On admit, resolved with dialysis. 6. HTN/volume: BP controlled, euvolemic on exam but volume up per weights, UF as tolerated tomorrow. 7. Anemia of ESRD: Hgb 7.5 - continue max dose Aranesp 2056m weekly while here. 8. Secondary HPTH: CorrCa ok, Phos high - restarted her usual outpatient binder - Fosrenol '1000mg'$  TID.  9. Nutrition: Alb low, continue supplements. 10. 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 [facility] - op note described 1 cup liquid being removed. Per our imaging her, size appears the same which is unfortunate. re-cultured 11/2 - NGTD. 11. T2DM with severe gastroparesis: Ongoing issue, s/p esophageal myotomy 09/17/21. Has issues swallow/keeping pills down. 12. L foot pain; Xray negative, uric acid normal. VVS consulted, s/p LE angiography     SaAnice PaganiniPA-C 11/18/2021, 9:58 AM  [facility] Pager: [phone]

## 2021-11-18 NOTE — Progress Notes (Signed)
Pt transferred to HD for Tx.

## 2021-11-18 NOTE — Progress Notes (Addendum)
PROGRESS NOTE  Susan Horton (DOB 20-Jun-1970) TIR:[id number]   PCP: Charlott Rakes, MD   Brief Narrative:  51 year old woman, former smoker, with end-stage renal disease on HD, CAD, hypertension with systolic and diastolic CHF, diabetes complicated by peripheral neuropathy and gastroparesis, lower extremity DVT, asthma.  She underwent recent excision of tumoral calcinosis left hip/thigh, drain and sutures removed on 10/20/2021 without any evidence of infection per office notes.    She was brought to the emergency department 10/29 with 3 days of progressive fatigue, weakness.  She apparently suffered a mechanical fall on 10/27 with resultant R hip pain.  In the ED noted to be febrile, lethargic/confused, tachycardic.  Slowly improving in mentation. Stay complicated by left foot pain needing angiogram.  And now left foot drop and hearing impairment. Neuroimaging pending.  Assessment and Plan: Acute metabolic encephalopathy: Unclear exact precipitant but has resolved. ?if related to sepsis or medications or uremia.  - Monitor  Abrupt onset bilateral sensorineural hearing loss: Confirmed by audiology 11/7, though symptom has actually resolved. Visualization of canal essentially clear-- no bulging of TM - Avoid ototoxins  Left foot pain: Negative XR, Uric acid wnl.  - continue lidoderm - Vascular consult:  left-sided SFA stent may be occluded due to high-grade stenosis and recent pressor requirement at time of admission  -s/p angiogram with improvement in pain  Left foot drop:  - AFO recommended  - CT head.    Septic shock: Shock resolved with antibiotics empirically. Initially concerned for possible bacteremia in patient with vascular access, on HD. Cultures done after abx are NGTD.  -CT Hip with some soft tissue edema which might reflect mild cellulitis. Wound draining-- culture done - NGTD - Completed broad abx 10/29 - 11/6. Remains with leukocytosis though wound appears stable and she's  afebrile.   Biventricular heart failure Severe TR (new) Pulmonary hypertension CAD s/p mLAD PCI w DES (2018) PVD  HLD HTN -in 2021 had normal LVEF and g2dd, mild TR. This admission LVEF 45-50, LV global hypokinesis. RV moderately reduced systolic fxn, moderately enlarged RV size, mildly elevated PASP. RVSP 43    P - DAPT  - volume management per RRT  - cards consulted: monitor   ESRD:  - Continue HD per nephrology  Hyperkalemia  Hyperphosphatemia   Uremia -HD 11/3 and again 11/6  Anemia of ESRD: Ferritin 5k, TIBC low, Q76, folic acid wnl.  - ESA deferred to nephrology, no active bleeding.  - Monitor and transfuse prn hgb < 7g/dl.   Asthma without exacerbation  - Continue breo ellipta, singulair, prn BD.   T2DM with diabetic neuropathy/gastroparesis: HbA1c 5% - Continue SSI    Tumoral calcinosis: recent left thigh/hip surgery. Cx NGTD. - Wound care  - May need further imaging if worsens otherwise will need to follow up at [facility]   Consultants:  PCCM Renal vascular  Subjective: Hearing is back to normal. Left foot is very tender like it is frostbitten. No change in that past 24 hours, no strength in the foot. No other weakness or numbness or other complaints.   Objective: BP (!) 114/44 (BP Location: Right Arm)   Pulse 80   Temp 98.3 F (36.8 C) (Oral)   Resp 15   Wt 65.7 kg   SpO2 99%   BMI 24.11 kg/m   Gen: No distress Pulm: Clear, nonlabored  CV: RRR, no MRG or pitting edema GI: Soft, NT, ND, +BS  Neuro: Alert and oriented. Left foot drop. No new focal deficits. Ext: Warm,  no deformities Skin: No acute rashes, lesions or ulcers on visualized skin    LOS: 10 days   Family Communication: None at bedside  Disposition Plan: CIR when bed available pending stroke r/o with CT.   Time spent: 45 minutes spent on chart review, discussion with nursing staff, consultants, updating family and interview/physical exam; more than 50% of that time was spent  in counseling and/or coordination of care.    Patrecia Pour, MD [facility] Available via Epic secure chat 7am-7pm After these hours, please refer to coverage provider listed on [email or web address] 11/18/2021, 4:25 PM

## 2021-11-18 NOTE — Progress Notes (Addendum)
[facility]  Subjective  - Left foot still painful to touch, however is seems to be improving.   Objective (!) 114/44 80 98.3 F (36.8 C) (Oral) 15 99%  Intake/Output Summary (Last 24 hours) at 11/18/2021 0071 Last data filed at 11/17/2021 1700 Gross per 24 hour  Intake --  Output 600 ml  Net -600 ml   Brisk left DP doppler signal No motor in ankle or toes, able to move knee and hip to reposition independently Continued drop foot Lungs non labored breathing General no acute distress   Assessment/Planning: 51 year old female with history of left external iliac artery and left SFA stenting for short distance claudication.  She has now been admitted with left hip infection and has severe rest pain in the left lower extremity with concern for occlusion of her stents    S/P stent of left common iliac artery with atherectomy and DCB of the left common femoral artery and stent of the left SFA in the setting of multilevel high-grade stenosis   Her pain seems to be improving some.  She states she rested better last night.  Would recommend AFO for left foot drop when at rest.  Continue PT for mobility.  Aspirin Plavix from vascular standpoint.  We will follow.   Roxy Horseman 11/18/2021 7:21 AM --  Laboratory Lab Results: Recent Labs    11/16/21 0500 11/17/21 0821  WBC 17.3* 17.6*  HGB 8.2* 7.5*  HCT 28.4* 25.5*  PLT 512* 415*   BMET Recent Labs    11/17/21 0821 11/18/21 0500  NA 134* 134*  K 3.9 3.4*  CL 95* 94*  CO2 25 25  GLUCOSE 145* 107*  BUN 25* 30*  CREATININE 4.86* 5.60*  CALCIUM 8.6* 8.6*    COAG Lab Results  Component Value Date   INR 1.3 (H) 11/09/2021   INR 1.2 11/08/2021   INR 1.1 01/09/2019   No results found for: "PTT"  I have seen and evaluated the patient. I agree with the PA note as documented above.  Postop day 2 status post left iliac stent with left common femoral atherectomy/drug-coated balloon  and left SFA stent.  She has a triphasic PT signal.  She feels that the pain in her left foot has improved some since yesterday.  Does have a notable foot drop and needs foot drop splint.  Aspirin Plavix from our standpoint.  [name], MD [facility] Office: [phone]

## 2021-11-19 ENCOUNTER — Inpatient Hospital Stay (HOSPITAL_COMMUNITY): Payer: Medicare Other

## 2021-11-19 DIAGNOSIS — N186 End stage renal disease: Secondary | ICD-10-CM | POA: Diagnosis not present

## 2021-11-19 DIAGNOSIS — A419 Sepsis, unspecified organism: Secondary | ICD-10-CM | POA: Diagnosis not present

## 2021-11-19 DIAGNOSIS — G9341 Metabolic encephalopathy: Secondary | ICD-10-CM | POA: Diagnosis not present

## 2021-11-19 DIAGNOSIS — L0390 Cellulitis, unspecified: Secondary | ICD-10-CM | POA: Diagnosis not present

## 2021-11-19 LAB — RENAL FUNCTION PANEL
Albumin: 1.9 g/dL — ABNORMAL LOW (ref 3.5–5.0)
Anion gap: 18 — ABNORMAL HIGH (ref 5–15)
BUN: 18 mg/dL (ref 6–20)
CO2: 23 mmol/L (ref 22–32)
Calcium: 8.9 mg/dL (ref 8.9–10.3)
Chloride: 94 mmol/L — ABNORMAL LOW (ref 98–111)
Creatinine, Ser: 4.07 mg/dL — ABNORMAL HIGH (ref 0.44–1.00)
GFR, Estimated: 13 mL/min — ABNORMAL LOW (ref >60)
Glucose, Bld: 106 mg/dL — ABNORMAL HIGH (ref 70–99)
Phosphorus: 6.5 mg/dL — ABNORMAL HIGH (ref 2.5–4.6)
Potassium: 3.3 mmol/L — ABNORMAL LOW (ref 3.5–5.1)
Sodium: 135 mmol/L (ref 135–145)

## 2021-11-19 LAB — GLUCOSE, CAPILLARY
Glucose-Capillary: 104 mg/dL — ABNORMAL HIGH (ref 70–99)
Glucose-Capillary: 108 mg/dL — ABNORMAL HIGH (ref 70–99)
Glucose-Capillary: 114 mg/dL — ABNORMAL HIGH (ref 70–99)
Glucose-Capillary: 169 mg/dL — ABNORMAL HIGH (ref 70–99)
Glucose-Capillary: 88 mg/dL (ref 70–99)

## 2021-11-19 MED ORDER — CHLORHEXIDINE GLUCONATE CLOTH 2 % EX PADS
6.0000 | MEDICATED_PAD | Freq: Every day | CUTANEOUS | Status: DC
  Administered 2021-11-19 – 2021-11-20 (×2): 6 via TOPICAL

## 2021-11-19 MED ORDER — FLUTICASONE FUROATE-VILANTEROL 200-25 MCG/ACT IN AEPB
1.0000 | INHALATION_SPRAY | Freq: Every day | RESPIRATORY_TRACT | Status: DC | PRN
  Filled 2021-11-19: qty 28

## 2021-11-19 MED ORDER — ALTEPLASE 2 MG IJ SOLR
2.0000 mg | Freq: Once | INTRAMUSCULAR | Status: AC
  Administered 2021-11-19: 2 mg
  Filled 2021-11-19: qty 2

## 2021-11-19 NOTE — Progress Notes (Signed)
PT Cancellation Note  Patient Details Name: Susan Horton MRN: [id number] DOB: [birth year]   Cancelled Treatment:    Reason Eval/Treat Not Completed: (P) Medical issues which prohibited therapy Pt actively vomiting on entry. Requests PT follow back tomorrow.   Tanay Massiah B. Migdalia Dk PT, DPT Acute Rehabilitation Services Please use secure chat or  Call Office [phone]    [place] 11/19/2021, 1:37 PM

## 2021-11-19 NOTE — Progress Notes (Signed)
[facility]  Subjective  - Her left foot is still very sensitive to touch and movement.     Objective (!) 111/50 99 98.5 F (36.9 C) (Oral) 18 98%  Intake/Output Summary (Last 24 hours) at 11/19/2021 0741 Last data filed at 11/18/2021 2315 Gross per 24 hour  Intake --  Output 2000 ml  Net -2000 ml    Brisk multiphasic PT left LE No motor appreciated in toes or ankle left LE Toes cool to touch dorsum of foot is warm totouch Continued drop foot Lungs non labored breathing General no acute distress   Assessment/Planning: 51 year old female with history of left external iliac artery and left SFA stenting for short distance claudication.  She has now been admitted with left hip infection and has severe rest pain in the left lower extremity with concern for occlusion of her stents     S/P stent of left common iliac artery with atherectomy and DCB of the left common femoral artery and stent of the left SFA in the setting of multilevel high-grade stenosis   Improved inflow with multiphasic doppler PT  Very slow improvement with pain in the left LE She is in the process of getting left AFO fitted likely today.  I am concerned that she will not tolerate this, but we should try so she can have function of the left LE during ambulation.  Aspirin Plavix from vascular standpoint.   Roxy Horseman 11/19/2021 7:41 AM --  Laboratory Lab Results: Recent Labs    11/17/21 0821  WBC 17.6*  HGB 7.5*  HCT 25.5*  PLT 415*   BMET Recent Labs    11/17/21 0821 11/18/21 0500  NA 134* 134*  K 3.9 3.4*  CL 95* 94*  CO2 25 25  GLUCOSE 145* 107*  BUN 25* 30*  CREATININE 4.86* 5.60*  CALCIUM 8.6* 8.6*    COAG Lab Results  Component Value Date   INR 1.3 (H) 11/09/2021   INR 1.2 11/08/2021   INR 1.1 01/09/2019   No results found for: "PTT"

## 2021-11-19 NOTE — Progress Notes (Signed)
CCMD called about irregular heart rhythm. Appears to be in Afib vs Aflut. Checked on patient. She states she feels fine and that she has a history, especially since previous stents placed. Foot pain remains elevated though; utilizing PRN pain meds. Left thigh dressing changed, as previous dressing from today was saturated.

## 2021-11-19 NOTE — PMR Pre-admission (Signed)
PMR Admission Coordinator Pre-Admission Assessment  Patient: Susan Horton is an 51 y.o., female MRN: [id number] DOB: 04/14/1970 Height:   Weight: 63.7 kg  Insurance Information HMO:     PPO:      PCP:      IPA:      80/20:      OTHER:  PRIMARY: Medicare a and b      Policy#: [id number]      Subscriber: pt Benefits:  Phone #: passport one source online     Name: 11/18/21 Eff. Date: 12/11/2017     Deduct: $1600      Out of Pocket Max: none      Life Max: none CIR: 100%      SNF: 20 full days Outpatient: 80%     Co-Pay: 20% Home Health: 100%      Co-Pay: none DME: 80%     Co-Pay: 20% Providers: pt choice  SECONDARY: BCBS supplement      Policy#:[id number]  Financial Counselor:       Phone#:   The "Data Collection Information Summary" for patients in Inpatient Rehabilitation Facilities with attached "Privacy Act [place] Records" was provided and verbally reviewed with: Patient  Emergency Contact Information Contact Information     Name Relation Home Work Mobile   McDaniels,Samuel Son   [phone]   Gemma Payor   [phone]      Current Medical History  Patient Admitting Diagnosis: Debility  History of Present Illness:   51 year old right-handed female with history of end-stage renal disease with hemodialysis, CAD with stenting maintained on aspirin and Plavix, hypertension, diastolic and systolic congestive heart failure, diabetes mellitus peripheral neuropathy, lower extremity DVT, asthma/quit smoking 5 years ago, peripheral vascular disease with history of left external iliac artery and left SFA stenting.  She underwent recent excision of tumoral calcinosis left hip/thigh, drain and sutures removed 10/20/2021 without evidence of infection per office notes at [facility].     Presented 11/08/2021 with progressive weakness and fall since 11/06/2021.  On arrival she is found to be febrile as well as tachycardic with mild altered mental status.  She  was admitted to the ICU for possible sepsis and placed on broad-spectrum antibiotics 11/08/2021 - 11/16/2021.  Cranial CT scan negative.  Chest x-ray showed vague hazy opacity right mid and lower lung worrisome for early developing infiltrate/pneumonia.  She did have some noted drainage from the left hip.  X-rays unilateral with and without contrast of pelvis showed no fracture or dislocation identified.  There was a large area of soft tissue calcification previously described as tumoral calcinosis measuring 21 x 15 cm versus 21 x 14 on the comparison study.  CT of left hip showed extensive calcification with areas layering hyperdense sediment involving the proximal extensor, gluteal and flexor compartment muscles of the left thigh with overall appearance most consistent with tumoral calcinosis.  Soft tissue edema in the subcutaneous fat along the proximal lateral hip and thigh reflecting mild cellulitis.  Admission chemistries potassium 6.9 BUN 117 creatinine 10.68, WBC 12,800, hemoglobin 8.8, SARS coronavirus negative, lactic acid 2.0-2.5, procalcitonin 9.57.  Echocardiogram with ejection fraction of 45 to 50% right ventricular systolic function moderately reduced.  Nephrology consulted with hemodialysis ongoing.    Vascular surgery consulted 11/14/2021 due to left foot tenderness and known peripheral arterial disease and concern for occlusion of her stents..  Aortogram completed undergoing stent of right common iliac artery as well as left common iliac artery with drug-coated balloon angioplasty left common femoral  artery and stent of left SFA 11/16/2021 per Dr. [name].  Patient continues to have some leukocytosis with all antibiotic treatment completed 11/08/2021 - 11/16/2021 and lattest WBC 20,200.  Initially concern for possible bacteremia in patient with vascular access on hemodialysis.  Cultures done after antibiotics showing no growth to date and patient remains afebrile.    She remains on Plavix and aspirin  as prior to admission.  Subcutaneous heparin was added for DVT prophylaxis.  Patient with abrupt onset bilateral sensorineural hearing loss confirmed by audiology 11/7 though symptoms have improved and recommendations follow-up outpatient.    Patient's medical record from [facility] has been reviewed by the rehabilitation admission coordinator and physician.  Past Medical History  Past Medical History:  Diagnosis Date   Abnormal stress test    Anemia    Angio-edema    Asthma    CAD (coronary artery disease)    DES to mid LAD July 2018, residual moderate RCA disease   Cataract    CKD (chronic kidney disease) stage 4, GFR 15-29 ml/min (HCC)    Dialysis T/Th/Sa   DVT (deep venous thrombosis) ([place])    1996 during pregnancy, 2015 left leg   Eczema    Essential hypertension 12/11/2015   Gastroparesis    GERD (gastroesophageal reflux disease)    Gluteal abscess 12/27/2018   Hidradenitis    Migraine    Neuropathy    Peripheral vascular disease (HCC)    blood clot in leg   Progressive angina ([place]) 06/05/2014   Chest pain   Type 2 diabetes mellitus ([place])    Type II diabetes mellitus ([place])    Urticaria    Has the patient had major surgery during 100 days prior to admission? Yes  Family History   family history includes Allergic rhinitis in her brother, father, mother, and sister; Breast cancer in her cousin; Hypertension in her brother, father, and sister.  Current Medications  Current Facility-Administered Medications:    acetaminophen (TYLENOL) tablet 650 mg, 650 mg, Oral, Q6H PRN, [name], MD, 650 mg at 11/21/21 0558   acetaminophen (TYLENOL) tablet 650 mg, 650 mg, Oral, Q4H PRN, [name], MD   aspirin chewable tablet 81 mg, 81 mg, Oral, Daily, [name], MD, 81 mg at 11/22/21 0071   clopidogrel (PLAVIX) tablet 75 mg, 75 mg, Oral, Q breakfast, [name], MD, 75 mg at 11/22/21 2197   Darbepoetin Alfa  (ARANESP) injection 200 mcg, 200 mcg, Subcutaneous, Q Mon-1800, [name], MD, 200 mcg at 11/09/21 1801   doxercalciferol (HECTOROL) injection 7 mcg, 7 mcg, Intravenous, Q M,W,F-HD, [name], MD, 7 mcg at 11/20/21 1052   fluticasone furoate-vilanterol (BREO ELLIPTA) 200-25 MCG/ACT 1 puff, 1 puff, Inhalation, Daily PRN, Patrecia Pour, MD   heparin injection 5,000 Units, 5,000 Units, Subcutaneous, Q8H, [name], MD, 5,000 Units at 11/23/21 0603   ipratropium-albuterol (DUONEB) 0.5-2.5 (3) MG/3ML nebulizer solution 3 mL, 3 mL, Nebulization, Q6H PRN, [name], MD   lidocaine (LIDODERM) 5 % 1 patch, 1 patch, Transdermal, Q24H, [name], MD, 1 patch at 11/23/21 1200   loperamide (IMODIUM) capsule 2 mg, 2 mg, Oral, PRN, [name], MD, 2 mg at 11/22/21 1438   metoCLOPramide (REGLAN) tablet 10 mg, 10 mg, Oral, TID AC, Patrecia Pour, MD, 10 mg at 11/23/21 0602   metoprolol succinate (TOPROL-XL) 24 hr tablet 25 mg, 25 mg, Oral, Daily, [name], MD, 25 mg at 11/22/21  0905   montelukast (SINGULAIR) tablet 10 mg, 10 mg, Oral, QHS, [name], MD, 10 mg at 11/19/21 0010   ondansetron (ZOFRAN-ODT) disintegrating tablet 4 mg, 4 mg, Oral, Q8H PRN, Patrecia Pour, MD, 4 mg at 11/22/21 1705   Oral care mouth rinse, 15 mL, Mouth Rinse, PRN, [name], MD   oxyCODONE (Oxy IR/ROXICODONE) immediate release tablet 10-15 mg, 10-15 mg, Oral, Q4H PRN, Patrecia Pour, MD, 15 mg at 11/23/21 0602   pantoprazole (PROTONIX) EC tablet 40 mg, 40 mg, Oral, Daily, Adefeso, [place], DO, 40 mg at 11/23/21 0602   polyethylene glycol (MIRALAX / GLYCOLAX) packet 17 g, 17 g, Oral, Daily PRN, [name], MD   thiamine (VITAMIN B1) tablet 100 mg, 100 mg, Oral, Daily, Patrecia Pour, MD, 100 mg at 11/22/21 5643  Patients Current Diet:  Diet Order             Diet renal/carb modified  with fluid restriction Diet-HS Snack? Nothing; Fluid restriction: 1200 mL Fluid; Room service appropriate? Yes; Fluid consistency: Thin  Diet effective now                  Precautions / Restrictions Precautions Precautions: Fall Precaution Comments: recent hip sx to L hip; left foot drop and bil hearing loss noted 11/7 Restrictions Weight Bearing Restrictions: No   Has the patient had 2 or more falls or a fall with injury in the past year? No  Prior Activity Level Community (5-7x/wk): Mod I with cane, drove self to OP HD  Prior Functional Level Self Care: Did the patient need help bathing, dressing, using the toilet or eating? Needed some help  Indoor Mobility: Did the patient need assistance with walking from room to room (with or without device)? Independent  Stairs: Did the patient need assistance with internal or external stairs (with or without device)? Independent  Functional Cognition: Did the patient need help planning regular tasks such as shopping or remembering to take medications? Independent  Patient Information Are you of Hispanic, Latino/a,or Spanish origin?: A. No, not of Hispanic, Latino/a, or Spanish origin What is your race?: B. Black or African American Do you need or want an interpreter to communicate with a doctor or health care staff?: 0. No  Patient's Response To:  Health Literacy and Transportation Is the patient able to respond to health literacy and transportation needs?: Yes Health Literacy - How often do you need to have someone help you when you read instructions, pamphlets, or other written material from your doctor or pharmacy?: Never In the past 12 months, has lack of transportation kept you from medical appointments or from getting medications?: No In the past 12 months, has lack of transportation kept you from meetings, work, or from getting things needed for daily living?: No  Home Assistive Devices / [place]: Rollator (4  wheels), Cane - single point  Prior Device Use: Indicate devices/aids used by the patient prior to current illness, exacerbation or injury?  cane  Current Functional Level Cognition  Overall Cognitive Status: Within Functional Limits for tasks assessed Current Attention Level: Focused Orientation Level: Oriented X4 Following Commands: Follows one step commands with increased time, Follows one step commands inconsistently Safety/Judgement: Decreased awareness of safety, Decreased awareness of deficits General Comments: pt with questions about AIR vs SNF    Extremity Assessment (includes Sensation/Coordination)  Upper Extremity Assessment: Defer to OT evaluation RUE Deficits / Details: jerking movements, with inability to hold arm against gravity.  Not following MMT commands RUE Coordination: decreased fine motor, decreased gross motor LUE Deficits / Details: jerking movements, with inability to hold arm against gravity. LUE Coordination: decreased fine motor, decreased gross motor  Lower Extremity Assessment: LLE deficits/detail, RLE deficits/detail RLE Deficits / Details: grossly 3-/5 LLE Deficits / Details: 1/5 plantar flexion and 0/5 dorsiflexion, knee 2-/5, hip 2-/5; ROM hip, knee and ankle limited by pain    ADLs  Overall ADL's : Needs assistance/impaired Eating/Feeding: Set up, Sitting Eating/Feeding Details (indicate cue type and reason): in recliner Grooming: Wash/dry hands, Wash/dry face, Maximal assistance, Bed level Upper Body Bathing: Maximal assistance, Bed level Lower Body Bathing: Total assistance, Bed level Upper Body Dressing : Maximal assistance, Sitting Lower Body Dressing: Maximal assistance Lower Body Dressing Details (indicate cue type and reason): unable to donn socks on RLE, did not want sock on LLE Toilet Transfer: Maximal assistance, BSC/3in1, Squat-pivot Toileting- Clothing Manipulation and Hygiene: Total assistance, Bed level General ADL Comments:  limited by pain    Mobility  Overal bed mobility: Needs Assistance Bed Mobility: Sit to Supine Rolling: Min assist Supine to sit: HOB elevated, Min guard Sit to supine: Min guard General bed mobility comments: min guard for safety    Transfers  Overall transfer level: Needs assistance Equipment used: None Transfers: Bed to chair/wheelchair/BSC Sit to Stand: Mod assist, From elevated surface Bed to/from chair/wheelchair/BSC transfer type:: Step pivot Squat pivot transfers: Min assist Step pivot transfers: Mod assist Transfer via Lift Equipment: [employer] transfer comment: pt with L LE on bed pad as she can not tolerate sock, attempted x2 to come to stand with therapist on R side, unable to come to fully upright, PT positioned in front of pt and able to use gait belt to provide power up with cuing for increased forward lean, with R foot resting on bed pad pt able to perform hopping pivot to chair    Ambulation / Gait / Stairs / Wheelchair Mobility  Ambulation/Gait General Gait Details: unable to tolerate weightbearing through L foot    Posture / Balance Dynamic Sitting Balance Sitting balance - Comments: Can sit EOB on her own. Balance Overall balance assessment: Needs assistance Sitting-balance support: Feet supported, No upper extremity supported Sitting balance-Leahy Scale: Fair Sitting balance - Comments: Can sit EOB on her own. Postural control: Posterior lean Standing balance support: Bilateral upper extremity supported, During functional activity Standing balance-Leahy Scale: Poor Standing balance comment: able to come to fully upright but requires outside physical assist to steady    Special needs/care consideration ESRD on OP Hemodialysis at [facility] self   Previous Home Environment  Living Arrangements: Other (Comment) (Lived alone until hip surgery, now with sister)  Lives With: Family Available Help at Discharge: Family, Available 24 hours/day Type of Home:  House Home Layout: One level Home Access: Level entry Bathroom Shower/Tub: Chiropodist: Standard Bathroom Accessibility: Yes How Accessible: Accessible via walker [place]: No Additional Comments: Patient wil be stayig at sister's home initially.  Above information is sister.  Discharge Living Setting Plans for Discharge Living Setting: Lives with (comment) (sister) Type of Home at Discharge: House Discharge Home Layout: One level Discharge Home Access: Level entry Discharge Bathroom Shower/Tub: Tub/shower unit Discharge Bathroom Toilet: Standard Discharge Bathroom Accessibility: Yes How Accessible: Accessible via walker Does the patient have any problems obtaining your medications?: No  Social/Family/Support Systems Patient Roles: Parent Contact Information: sister, Leandro Reasoner Anticipated Caregiver: sister and other family members Anticipated Caregiver's Contact Information: see contacts  Ability/Limitations of Caregiver: no limitations Caregiver Availability: 24/7 Discharge Plan Discussed with Primary Caregiver: Yes Is Caregiver In Agreement with Plan?: Yes Does Caregiver/Family have Issues with Lodging/Transportation while Pt is in Rehab?: No  Goals Patient/Family Goal for Rehab: Mod I to supervision with PT and OT Expected length of stay: ELOS 10 to 12 days Additional Information: OP hemodialysis Pt/Family Agrees to Admission and willing to participate: Yes Program Orientation Provided & Reviewed with Pt/Caregiver Including Roles  & Responsibilities: Yes  Decrease burden of Care through IP rehab admission: n/a  Possible need for SNF placement upon discharge: not anticipated Patient Condition: I have reviewed medical records from [facility], spoken with CM, and patient and daughter. I met with patient at the bedside for inpatient rehabilitation assessment.  Patient will benefit from ongoing PT and OT, can actively participate in 3 hours  of therapy a day 5 days of the week, and can make measurable gains during the admission.  Patient will also benefit from the coordinated team approach during an Inpatient Acute Rehabilitation admission.  The patient will receive intensive therapy as well as Rehabilitation physician, nursing, social worker, and care management interventions.  Due to bladder management, bowel management, safety, skin/wound care, disease management, medication administration, pain management, and patient education the patient requires 24 hour a day rehabilitation nursing.  The patient is currently mod assist overall with mobility and basic ADLs.  Discharge setting and therapy post discharge at home with home health is anticipated.  Patient has agreed to participate in the Acute Inpatient Rehabilitation Program and will admit today.  Preadmission Screen Completed By:  Cleatrice Burke, 11/23/2021 12:56 PM ______________________________________________________________________   Discussed status with Dr. Naaman Plummer on 11/23/21 at 1300 and received approval for admission today.  Admission Coordinator:  Cleatrice Burke, RN, time 1300 Date 11/23/21   Assessment/Plan: Diagnosis: Debility Does the need for close, 24 hr/day Medical supervision in concert with the patient's rehab needs make it unreasonable for this patient to be served in a less intensive setting? Yes Co-Morbidities requiring supervision/potential complications: ESRD on HD, CAD, HTN, CHF, DM Due to bladder management, bowel management, safety, skin/wound care, disease management, medication administration, pain management, and patient education, does the patient require 24 hr/day rehab nursing? Yes Does the patient require coordinated care of a physician, rehab nurse, PT, OT to address physical and functional deficits in the context of the above medical diagnosis(es)? Yes Addressing deficits in the following areas: balance, endurance, locomotion,  strength, transferring, bowel/bladder control, bathing, dressing, feeding, grooming, toileting, and psychosocial support Can the patient actively participate in an intensive therapy program of at least 3 hrs of therapy 5 days a week? Yes The potential for patient to make measurable gains while on inpatient rehab is excellent Anticipated functional outcomes upon discharge from inpatient rehab: modified independent and supervision PT, modified independent and supervision OT, n/a SLP Estimated rehab length of stay to reach the above functional goals is: 10-12 days Anticipated discharge destination: Home 10. Overall Rehab/Functional Prognosis: excellent   MD Signature: Meredith Staggers, MD, [place] Director Rehabilitation Services 11/23/2021

## 2021-11-19 NOTE — Progress Notes (Signed)
PROGRESS NOTE  Susan Horton (DOB [birth year]) [id number]   PCP: Charlott Rakes, MD   Brief Narrative:  51 year old woman, former smoker, with end-stage renal disease on HD, CAD, hypertension with systolic and diastolic CHF, diabetes complicated by peripheral neuropathy and gastroparesis, lower extremity DVT, asthma.  She underwent recent excision of tumoral calcinosis left hip/thigh, drain and sutures removed on 10/20/2021 without any evidence of infection per office notes.    She was brought to the emergency department 10/29 with 3 days of progressive fatigue, weakness.  She apparently suffered a mechanical fall on 10/27 with resultant R hip pain.  In the ED noted to be febrile, lethargic/confused, tachycardic.  Slowly improving in mentation. Stay complicated by left foot pain needing angiogram.  And now left foot drop and hearing impairment. Neuroimaging negative.   Assessment and Plan: Acute metabolic encephalopathy: Unclear exact precipitant but has resolved. ?if related to sepsis or medications or uremia.  - Monitor  Abrupt onset bilateral sensorineural hearing loss: Confirmed by audiology 11/7, though symptom has actually resolved. Visualization of canal essentially clear-- no bulging of TM. No acute abnormality on head CT. - Avoid ototoxins  Left foot pain: Negative XR, Uric acid wnl.  - Continue lidoderm - Vascular consult:  left-sided SFA stent may be occluded due to high-grade stenosis and recent pressor requirement at time of admission  -s/p angiogram with improvement in pain  Left foot drop:  - AFO recommended  - CT head.    Septic shock: Shock resolved with antibiotics empirically. Initially concerned for possible bacteremia in patient with vascular access, on HD. Cultures done after abx are NGTD.  - CT Hip with some soft tissue edema which might reflect mild cellulitis. Wound draining but no exudate, culture negative. - Completed broad abx 10/29 - 11/6. Remains with  leukocytosis though wound appears stable and she's afebrile. Will recheck in AM.   Biventricular heart failure Severe TR (new) Pulmonary hypertension CAD s/p mLAD PCI w DES (2018) PVD  HLD HTN -in 2021 had normal LVEF and g2dd, mild TR. This admission LVEF 45-50, LV global hypokinesis. RV moderately reduced systolic fxn, moderately enlarged RV size, mildly elevated PASP. RVSP 43. - DAPT  - volume management per RRT  - cards consulted: monitor   ESRD:  - Continue HD per nephrology  Hyperkalemia: Resolved with HD.  Hyperphosphatemia   Uremia: Resolved with HD  Anemia of ESRD: Ferritin 5k, TIBC low, S93, folic acid wnl.  - ESA deferred to nephrology, no active bleeding.  - Monitor and transfuse prn hgb < 7g/dl. Recheck in AM. May have been unable to stick patient or obtain labs today.   Asthma without exacerbation  - Continue breo ellipta, singulair, prn BD.   T2DM with diabetic neuropathy/gastroparesis: HbA1c 5% - Continue SSI    Tumoral calcinosis: Excision from left thigh performed at [facility] 9/29. Cx NGTD. - Continue wound care per orders.  - Will need to follow up with WF surgery (was planning to follow up 6 weeks postop which is 11/10).    Consultants:  PCCM Renal Vascular  Subjective: Thinks she ate eggs too fast and her gastroparesis is flaring, having vomiting today which is not terribly unusual for her. Denies feeling unwell or fever.   Objective: BP (!) 107/54 (BP Location: Right Arm)   Pulse 95   Temp 98.6 F (37 C) (Oral)   Resp 18   Wt 62 kg   SpO2 100%   BMI 22.73 kg/m   Gen: No distress but  is actively vomiting stomach contents.  Pulm: Clear, nonlabored CV: RRR, no MRG.  GI: +BS, soft, NT, ND. Neuro: Alert and oriented, diminished hearing bilaterally, no other CN deficits. Left foot drop is stable.  Ext: Tender left foot without visible abnormality. +DP pulse on doppler Skin: Left lateral thigh with surgical longitudinal wound with 2 areas  opened with serous discharge. No exudate, significant tenderness, bleeding, or surrounding induration or erythema.   Family Communication: None at bedside  Disposition Plan: CIR when bed available.  Time spent: 45 minutes spent on chart review, discussion with nursing staff, consultants, updating family and interview/physical exam; more than 50% of that time was spent in counseling and/or coordination of care.    Patrecia Pour, MD [facility] Available via Epic secure chat 7am-7pm After these hours, please refer to coverage provider listed on [email or web address] 11/19/2021, 5:21 PM

## 2021-11-19 NOTE — Progress Notes (Signed)
Pt returned from HD tx.

## 2021-11-19 NOTE — Progress Notes (Signed)
OT Cancellation Note  Patient Details Name: Susan Horton MRN: [id number] DOB: Jun 10, 1970   Cancelled Treatment:    Reason Eval/Treat Not Completed: Patient at procedure or test/ unavailable (Patient leaving unit for CT.  Will attempt later as schedule permits) Lodema Hong, [place]  Office [place] 11/19/2021, 7:50 AM

## 2021-11-19 NOTE — H&P (Signed)
Physical Medicine and Rehabilitation Admission H&P    Chief Complaint  Patient presents with   Weakness  : HPI: Susan Horton is a 51 year old right-handed female with history of end-stage renal disease with hemodialysis, CAD with stenting maintained on aspirin and Plavix, hypertension, diastolic and systolic congestive heart failure, diabetes mellitus peripheral neuropathy, lower extremity DVT, asthma/quit smoking 5 years ago, peripheral vascular disease with history of left external iliac artery and left SFA stenting.  She underwent recent excision of tumoral calcinosis left hip/thigh, drain and sutures removed 10/20/2021 without evidence of infection per office notes at [facility].  Per chart review use of straight point cane prior to admission.  1 level home.  Plans to stay with her sister on discharge.  Presented 11/08/2021 with progressive weakness and fall since 11/06/2021.  On arrival she is found to be febrile as well as tachycardic with mild altered mental status.  She was admitted to the ICU for possible sepsis and placed on broad-spectrum antibiotics 11/08/2021 - 11/16/2021.  Cranial CT scan negative.  Chest x-ray showed vague hazy opacity right mid and lower lung worrisome for early developing infiltrate/pneumonia.  She did have some noted drainage from the left hip.  X-rays unilateral with and without contrast of pelvis showed no fracture or dislocation identified.  There was a large area of soft tissue calcification previously described as tumoral calcinosis measuring 21 x 15 cm versus 21 x 14 on the comparison study.  CT of left hip showed extensive calcification with areas layering hyperdense sediment involving the proximal extensor, gluteal and flexor compartment muscles of the left thigh with overall appearance most consistent with tumoral calcinosis.  Soft tissue edema in the subcutaneous fat along the proximal lateral hip and thigh reflecting mild cellulitis.  Admission  chemistries potassium 6.9 BUN 117 creatinine 10.68, WBC 12,800, hemoglobin 8.8, SARS coronavirus negative, lactic acid 2.0-2.5, procalcitonin 9.57.  Echocardiogram with ejection fraction of 45 to 50% right ventricular systolic function moderately reduced.  Nephrology consulted with hemodialysis ongoing.  Vascular surgery consulted 11/14/2021 due to left foot tenderness and known peripheral arterial disease and concern for occlusion of her stents..  Aortogram completed undergoing stent of right common iliac artery as well as left common iliac artery with drug-coated balloon angioplasty left common femoral artery and stent of left SFA 11/16/2021 per Dr. [name].  Patient continues to have some leukocytosis with all antibiotic treatment completed 11/08/2021 - 11/16/2021 and lattest WBC 20,200.  Initially concern for possible bacteremia in patient with vascular access on hemodialysis.  Cultures done after antibiotics showing no growth to date and patient remains afebrile.  He remains on Plavix and aspirin as prior to admission.  Subcutaneous heparin was added for DVT prophylaxis.  Patient with abrupt onset bilateral sensorineural hearing loss confirmed by audiology 11/7 though symptoms have improved and recommendations follow-up outpatient.  Therapy evaluations completed due to patient decreased functional mobility was admitted for a comprehensive rehab program.  Review of Systems  Constitutional:  Positive for fever and malaise/fatigue.  HENT:  Negative for hearing loss.   Eyes:  Negative for discharge.  Respiratory:  Positive for shortness of breath. Negative for cough and wheezing.   Cardiovascular:  Positive for leg swelling. Negative for chest pain and claudication.  Gastrointestinal:  Positive for constipation. Negative for heartburn, nausea and vomiting.       GERD  Genitourinary:  Negative for dysuria, flank pain and hematuria.  Musculoskeletal:  Positive for joint pain and myalgias.  Skin:  Negative for  rash.  Neurological:  Positive for weakness and headaches.  All other systems reviewed and are negative.  Past Medical History:  Diagnosis Date   Abnormal stress test    Anemia    Angio-edema    Asthma    CAD (coronary artery disease)    DES to mid LAD July 2018, residual moderate RCA disease   Cataract    CKD (chronic kidney disease) stage 4, GFR 15-29 ml/min (HCC)    Dialysis T/Th/Sa   DVT (deep venous thrombosis) ([place])    1996 during pregnancy, 2015 left leg   Eczema    Essential hypertension 12/11/2015   Gastroparesis    GERD (gastroesophageal reflux disease)    Gluteal abscess 12/27/2018   Hidradenitis    Migraine    Neuropathy    Peripheral vascular disease (HCC)    blood clot in leg   Progressive angina ([place]) 06/05/2014   Chest pain   Type 2 diabetes mellitus ([place])    Type II diabetes mellitus ([place])    Urticaria    Past Surgical History:  Procedure Laterality Date   A/V FISTULAGRAM N/A 06/22/2017   Procedure: A/V FISTULAGRAM - left arm;  Surgeon: [name], MD;  Location: [place] [facility];  Service: Cardiovascular;  Laterality: N/A;   ABDOMINAL AORTOGRAM W/LOWER EXTREMITY Bilateral 12/14/2018   Procedure: ABDOMINAL AORTOGRAM W/LOWER EXTREMITY;  Surgeon: [name], MD;  Location: [place] [facility];  Service: Cardiovascular;  Laterality: Bilateral;   ABDOMINAL AORTOGRAM W/LOWER EXTREMITY N/A 04/25/2019   Procedure: ABDOMINAL AORTOGRAM W/LOWER EXTREMITY;  Surgeon: [name], MD;  Location: [place] [facility];  Service: Cardiovascular;  Laterality: N/A;   ABDOMINAL AORTOGRAM W/LOWER EXTREMITY Left 07/18/2019   Procedure: ABDOMINAL AORTOGRAM W/LOWER EXTREMITY;  Surgeon: [name], MD;  Location: [place] [facility];  Service: Cardiovascular;  Laterality: Left;   ABDOMINAL AORTOGRAM W/LOWER EXTREMITY N/A 11/16/2021   Procedure: ABDOMINAL AORTOGRAM W/LOWER EXTREMITY;  Surgeon: [name], MD;  Location: [place]  [facility];  Service: Cardiovascular;  Laterality: N/A;   ABDOMINOPLASTY     ADENOIDECTOMY     APPENDECTOMY  1995   AV FISTULA PLACEMENT Left 02/01/2017   Procedure: ARTERIOVENOUS BRACHIOCEPHALIC (AV) FISTULA CREATION;  Surgeon: [name], MD;  Location: [facility];  Service: Vascular;  Laterality: Left;   [place] INTERVENTION N/A 08/05/2016   Procedure: Coronary Stent Intervention;  Surgeon: [name], MD;  Location: [place] [facility];  Service: Cardiovascular;  Laterality: N/A;   ESOPHAGOGASTRODUODENOSCOPY (EGD) WITH PROPOFOL N/A 07/01/2020   Procedure: ESOPHAGOGASTRODUODENOSCOPY (EGD) WITH PROPOFOL;  Surgeon: Gatha Mayer, MD;  Location: [place];  Service: Endoscopy;  Laterality: N/A;   EXPLORATORY LAPAROTOMY  08/14/2005   lysis of adhesions, drainage of tubo-ovarian abscess   FISTULA SUPERFICIALIZATION Left 09/14/2017   Procedure: FISTULA SUPERFICIALIZATION ARTERIOVENOUS FISTULA LEFT ARM;  Surgeon: [name], MD;  Location: [place];  Service: Vascular;  Laterality: Left;   FLEXIBLE SIGMOIDOSCOPY N/A 01/04/2019   Procedure: FLEXIBLE SIGMOIDOSCOPY;  Surgeon: Clarene Essex, MD;  Location: [place];  Service: Endoscopy;  Laterality: N/A;   HYDRADENITIS EXCISION  02/08/2011   Procedure: EXCISION HYDRADENITIS GROIN;  Surgeon: [name], MD;  Location: [place];  Service: General;  Laterality: N/A;  Excisioin of Hidradenitis Left groin   INCISION AND DRAINAGE ABSCESS N/A 01/11/2019   Procedure: INCISION AND DRAINAGE SACRAL  ABSCESS;  Surgeon: Georganna Skeans, MD;  Location: [place];  Service: General;  Laterality: N/A;   INGUINAL HIDRADENITIS EXCISION  07/06/2010   bilateral   INSERTION OF DIALYSIS CATHETER N/A 09/14/2017   Procedure: INSERTION OF TUNNELED DIALYSIS CATHETER Right Internal Jugular;  Surgeon: [name], MD;  Location: [place];  Service: Vascular;   Laterality: N/A;   IR FLUORO GUIDE CV LINE RIGHT  03/01/2019   IR US GUIDE BX ASP/DRAIN  01/01/2019   IR US GUIDE VASC ACCESS RIGHT  03/01/2019   LEFT HEART CATH AND CORONARY ANGIOGRAPHY N/A 08/05/2016   Procedure: Left Heart Cath and Coronary Angiography;  Surgeon: [name], MD;  Location: [place] [facility];  Service: Cardiovascular;  Laterality: N/A;   PERIPHERAL VASCULAR INTERVENTION Left 12/14/2018   Procedure: PERIPHERAL VASCULAR INTERVENTION;  Surgeon: [name], MD;  Location: [place] [facility];  Service: Cardiovascular;  Laterality: Left;  SFA   PERIPHERAL VASCULAR INTERVENTION Left 04/25/2019   Procedure: PERIPHERAL VASCULAR INTERVENTION;  Surgeon: [name], MD;  Location: [place] [facility];  Service: Cardiovascular;  Laterality: Left;  right superficial femoral, and left iliac   PERIPHERAL VASCULAR INTERVENTION Left 07/18/2019   Procedure: PERIPHERAL VASCULAR INTERVENTION;  Surgeon: [name], MD;  Location: [place] [facility];  Service: Cardiovascular;  Laterality: Left;  external iliac   REDUCTION MAMMAPLASTY  2002   TONSILLECTOMY     TUBAL LIGATION  1996   VAGINAL HYSTERECTOMY  08/04/2005   and cysto   Family History  Problem Relation Age of Onset   Allergic rhinitis Mother    Hypertension Father    Allergic rhinitis Father    Hypertension Sister    Allergic rhinitis Sister    Hypertension Brother    Allergic rhinitis Brother    Breast cancer Cousin        x2   Social History:  reports that she quit smoking about 5 years ago. Her smoking use included cigarettes. She has never used smokeless tobacco. She reports current drug use. Frequency: 2.00 times per week. Drug: Marijuana. She reports that she does not drink alcohol. Allergies:  Allergies  Allergen Reactions   Repatha [Evolocumab] Itching   Lisinopril Cough   Medications Prior to Admission  Medication Sig Dispense Refill   acetaminophen-codeine (TYLENOL #3) 300-30 MG tablet  Take 1 tablet by mouth at bedtime as needed for moderate pain. 30 tablet 0   albuterol (VENTOLIN HFA) 108 (90 Base) MCG/ACT inhaler Inhale 1-2 puffs into the lungs every 6 (six) hours as needed for wheezing or shortness of breath. 18 g 2   amLODipine (NORVASC) 10 MG tablet TAKE 1 TABLET BY MOUTH EVERY DAY (Patient taking differently: Take 10 mg by mouth daily.) 90 tablet 3   aspirin 81 MG chewable tablet Chew 1 tablet (81 mg total) by mouth daily. 30 tablet 0   atorvastatin (LIPITOR) 40 MG tablet TAKE 1 TABLET BY MOUTH EVERY DAY (Patient taking differently: Take 40 mg by mouth daily.) 90 tablet 1   B Complex-C-Zn-Folic Acid (DIALYVITE/ZINC) TABS Take 1 tablet by mouth daily.     calcitRIOL (ROCALTROL) 0.5 MCG capsule Take 1.5 mcg by mouth daily.     calcium acetate (PHOSLO) 667 MG capsule Take 667 mg by mouth 3 (three) times daily with meals.  10   clopidogrel (PLAVIX) 75 MG tablet TAKE 1 TABLET (75 MG TOTAL) BY MOUTH DAILY WITH BREAKFAST. 90 tablet 3   cyclobenzaprine (FLEXERIL) 10 MG tablet Take 1  tablet (10 mg total) by mouth 2 (two) times daily as needed. 60 tablet 1   fluticasone furoate-vilanterol (BREO ELLIPTA) 200-25 MCG/ACT AEPB Inhale 1 puff into the lungs daily. 60 each 3   gabapentin (NEURONTIN) 300 MG capsule Take 300 mg by mouth 3 (three) times daily.     hydrALAZINE (APRESOLINE) 50 MG tablet Take 50 mg by mouth every 8 (eight) hours.     ipratropium-albuterol (DUONEB) 0.5-2.5 (3) MG/3ML SOLN Take 3 mLs by nebulization every 6 (six) hours as needed. 360 mL 2   lanthanum (FOSRENOL) 1000 MG chewable tablet Chew 1,000 mg by mouth 5 (five) times daily as needed.     Methoxy PEG-Epoetin Beta (MIRCERA IJ) Inject into the skin.     metoprolol succinate (TOPROL-XL) 50 MG 24 hr tablet TAKE 1.5 TABLETS BY MOUTH DAILY 135 tablet 3   montelukast (SINGULAIR) 10 MG tablet Take 1 tablet (10 mg total) by mouth at bedtime. 90 tablet 1   ondansetron (ZOFRAN ODT) 4 MG disintegrating tablet Take 1 tablet  (4 mg total) by mouth every 8 (eight) hours as needed for nausea or vomiting. 20 tablet 0   oxyCODONE-acetaminophen (PERCOCET/ROXICET) 5-325 MG tablet Take 1 tablet by mouth every 8 (eight) hours as needed for severe pain. 21 tablet 0   promethazine (PHENERGAN) 25 MG tablet Take 1 tablet (25 mg total) by mouth every 8 (eight) hours as needed for nausea or vomiting. 30 tablet 1   sucralfate (CARAFATE) 1 GM/10ML suspension Take 10 mLs (1 g total) by mouth 4 (four) times daily -  with meals and at bedtime. 420 mL 0   HYDROcodone-acetaminophen (NORCO) 10-325 MG tablet Take 0.5 tablets by mouth every 8 (eight) hours as needed for severe pain. (Patient not taking: Reported on 11/08/2021) 8 tablet 0   iron sucrose (VENOFER) 20 MG/ML injection Iron Sucrose (Venofer) (Patient not taking: Reported on 11/08/2021)     isosorbide mononitrate (IMDUR) 30 MG 24 hr tablet Take 15 mg by mouth daily as needed (chest pain).     Misc. Devices MISC Electric scooter for mobility 1 each 0   nitroGLYCERIN (NITROSTAT) 0.4 MG SL tablet Place 1 tablet (0.4 mg total) under the tongue every 5 (five) minutes as needed for chest pain. 25 tablet 2   pantoprazole (PROTONIX) 40 MG tablet Take 1 tablet (40 mg total) by mouth daily. (Patient not taking: Reported on 11/08/2021) 30 tablet 0   PARoxetine (PAXIL) 20 MG tablet Take 1 tablet (20 mg total) by mouth daily. For hot flashes (Patient not taking: Reported on 11/08/2021) 30 tablet 3   PRALUENT 75 MG/ML SOAJ INJECT 75 MG INTO THE SKIN EVERY 14 (FOURTEEN) DAYS. 2 mL 11      Home: Home Living Family/patient expects to be discharged to:: Private residence Living Arrangements: Other (Comment) (Lived alone until hip surgery, now with sister) Available Help at Discharge: Family, Available 24 hours/day Type of Home: House Home Access: Level entry Home Layout: One level Bathroom Shower/Tub: Chiropodist: Standard Bathroom Accessibility: Yes Home Equipment:  Rollator (4 wheels), Cane - single point Additional Comments: Patient wil be stayig at sister's home initially.  Above information is sister.  Lives With: Family   Functional History: Prior Function Prior Level of Function : Independent/Modified Independent (until 3 weeks pta was Mod I) Mobility Comments: amb with SPC PTA ADLs Comments: sister assists with ADLs since L hip surgery  Functional Status:  Mobility: Bed Mobility Overal bed mobility: Needs Assistance Bed Mobility: Sit to  Supine Rolling: Min assist Supine to sit: HOB elevated, Min guard Sit to supine: Min guard General bed mobility comments: min guard for safety with management of LE back into bed Transfers Overall transfer level: Needs assistance Equipment used: None Transfers: Bed to chair/wheelchair/BSC Sit to Stand: +2 physical assistance, +2 safety/equipment, From elevated surface, Mod assist, Max assist Bed to/from chair/wheelchair/BSC transfer type:: Squat pivot Squat pivot transfers: Min assist Transfer via Lift Equipment: Stedy General transfer comment: pt able to sequence pivot transfer from chair back to bed for HD Ambulation/Gait General Gait Details: unable due to L foot pain with weightbearing    ADL: ADL Overall ADL's : Needs assistance/impaired Eating/Feeding: Set up, Sitting Eating/Feeding Details (indicate cue type and reason): in recliner Grooming: Wash/dry hands, Wash/dry face, Maximal assistance, Bed level Upper Body Bathing: Maximal assistance, Bed level Lower Body Bathing: Total assistance, Bed level Upper Body Dressing : Maximal assistance, Sitting Lower Body Dressing: Maximal assistance Lower Body Dressing Details (indicate cue type and reason): unable to donn socks on RLE, did not want sock on LLE Toilet Transfer: Maximal assistance, BSC/3in1, Squat-pivot Toileting- Clothing Manipulation and Hygiene: Total assistance, Bed level General ADL Comments: limited by  pain  Cognition: Cognition Overall Cognitive Status: Within Functional Limits for tasks assessed Orientation Level: Oriented X4 Cognition Arousal/Alertness: Awake/alert Behavior During Therapy: WFL for tasks assessed/performed Overall Cognitive Status: Within Functional Limits for tasks assessed Area of Impairment: Orientation, Attention, Memory, Following commands, Safety/judgement, Awareness, Problem solving Orientation Level: Disoriented to, Time, Place, Situation Current Attention Level: Focused Memory: Decreased short-term memory Following Commands: Follows one step commands with increased time, Follows one step commands inconsistently Safety/Judgement: Decreased awareness of safety, Decreased awareness of deficits Awareness: Intellectual Problem Solving: Slow processing, Decreased initiation, Difficulty sequencing, Requires verbal cues, Requires tactile cues General Comments: pt with questions about AIR vs SNF  Physical Exam: Blood pressure (!) 120/49, pulse 80, temperature 98.1 F (36.7 C), temperature source Oral, resp. rate 19, weight 63.7 kg, SpO2 98 %. Physical Exam Constitutional:      General: She is not in acute distress. HENT:     Head: Normocephalic and atraumatic.     Nose: Nose normal.     Mouth/Throat:     Mouth: Mucous membranes are moist.  Eyes:     Extraocular Movements: Extraocular movements intact.     Pupils: Pupils are equal, round, and reactive to light.  Cardiovascular:     Rate and Rhythm: Normal rate and regular rhythm.     Heart sounds: No murmur heard.    No gallop.  Pulmonary:     Effort: Pulmonary effort is normal. No respiratory distress.     Breath sounds: No wheezing.  Abdominal:     General: Bowel sounds are normal. There is no distension.     Palpations: Abdomen is soft.     Tenderness: There is no abdominal tenderness.  Musculoskeletal:     Cervical back: Normal range of motion.     Comments: Left hip tender with ROM, appears  swollen. Large dressing in place. Left foot hypersensitive to touch.  Skin:    Comments: Left hip Xeroform dressing in place.  2 incisions left posterior lateral thigh without odor but with yellowish/tan drainage with clumpy, white debris. No odor. Wounds themselves appear pink without necrotic tissue. Left foot: tip of great toe and 2nd/third toes appear ischemic. AVF in LUE  Neurological:     Mental Status: She is alert.     Comments:    Alert and  oriented x 3. Normal insight and awareness. Intact Memory. Normal language and speech. Cranial nerve exam unremarkable. UE grossly 5/5. RLE 3+ to 4/5 prox to 4+/5 distally. LLE 2/5 prox to trace/5 ankle. Stocking glove sensory loss bilateral LE, L>R up to mid calf.   Psychiatric:        Mood and Affect: Mood normal.        Behavior: Behavior normal.     Results for orders placed or performed during the hospital encounter of 11/08/21 (from the past 48 hour(s))  Renal function panel     Status: Abnormal   Collection Time: 11/22/21  1:11 AM  Result Value Ref Range   Sodium 139 135 - 145 mmol/L   Potassium 3.4 (L) 3.5 - 5.1 mmol/L   Chloride 95 (L) 98 - 111 mmol/L   CO2 26 22 - 32 mmol/L   Glucose, Bld 169 (H) 70 - 99 mg/dL    Comment: Glucose reference range applies only to samples taken after fasting for at least 8 hours.   BUN 29 (H) 6 - 20 mg/dL   Creatinine, Ser 4.35 (H) 0.44 - 1.00 mg/dL   Calcium 9.4 8.9 - 10.3 mg/dL   Phosphorus 6.1 (H) 2.5 - 4.6 mg/dL   Albumin 1.8 (L) 3.5 - 5.0 g/dL   GFR, Estimated 12 (L) >60 mL/min    Comment: (NOTE) Calculated using the CKD-EPI Creatinine Equation (2021)    Anion gap 18 (H) 5 - 15    Comment: Performed at [facility] [address] [place], [place] [address]  CBC     Status: Abnormal   Collection Time: 11/22/21  1:11 AM  Result Value Ref Range   WBC 20.2 (H) 4.0 - 10.5 K/uL   RBC 3.08 (L) 3.87 - 5.11 MIL/uL   Hemoglobin 7.3 (L) 12.0 - 15.0 g/dL   HCT 24.9 (L) 36.0 - 46.0 %   MCV  80.8 80.0 - 100.0 fL   MCH 23.7 (L) 26.0 - 34.0 pg   MCHC 29.3 (L) 30.0 - 36.0 g/dL   RDW 20.7 (H) 11.5 - 15.5 %   Platelets 490 (H) 150 - 400 K/uL   nRBC 0.0 0.0 - 0.2 %    Comment: Performed at [facility], [facility] [address], [place], [place] [address]   No results found.    Blood pressure (!) 120/49, pulse 80, temperature 98.1 F (36.7 C), temperature source Oral, resp. rate 19, weight 63.7 kg, SpO2 98 %.  Medical Problem List and Plan: 1. Functional deficits secondary to septic shock/metabolic with debility and encephalopathy. Pt appears cognitively to be at baseline. -  Antibiotic therapy 10/29 - 11/6  -patient may shower if left hip is covered  -ELOS/Goals: 10-12 days, mod I to supervision goals 2.  Antithrombotics: -DVT/anticoagulation:  Pharmaceutical: Heparin  -antiplatelet therapy: Aspirin 81 mg daily and Plavix 75 mg daily 3. Pain Management: Lidoderm patch, oxycodone as needed 4. Mood/Behavior/Sleep: Provide emotional support  -antipsychotic agents: N/A 5. Neuropsych/cognition: This patient is capable of making decisions on her own behalf. 6. Skin/Wound Care: Continue BID to TID dressing changes to left hip. -WOC follow-up for left hip drainage. -completed course of broad spectrum abx on 11/6   -Assistance provided by orthopedics. 7. Fluids/Electrolytes/Nutrition: Routine in and outs with follow-up chemistries 8.  Peripheral vascular disease.  Follow-up vascular surgery for occluded stents status post right common iliac left common iliac stenting with balloon angioplasty left common femoral and stent left SFA 11/16/2021 per Dr.  Cain 9.  Tumoral calcinosis.  Recent excision of tumoral calcinosis left hip thigh at [facility] 10/20/2021.  Weightbearing as tolerated 10.  End-stage renal disease.  Continue hemodialysis as directed 11.  Acute on chronic anemia.  Continue iron supplement 12.  Leukocytosis.  (20k)  -Broad spectrum antibiotic therapy completed  11/16/2021 and monitor.  Latest blood culture showed no growth  -appearance of drainage is concerning. Recent CT (10/30) demonstrating changes c/w tumoral calcinosis and mild cellulitis.   -consider MRI of left hip depending upon drainage, WBC, etc.   -ischemic toes LLE could also be a source  -continue wound care as above  -pt feels well otherwise and no other signs of infection  -consider H/O consult if persistent 13.  Asthma without exacerbation as well as remote history of tobacco abuse.  Continue inhalers as directed check oxygen saturations every shift 14. Diet controlled Diabetes mellitus. Blood sugar checks discontinued 15.  Hypertension.  Toprol-XL 25 mg daily.  Monitor with increased mobility 16.  Abrupt onset bilateral sensorineural hearing loss.  Confirmed by audiology 11/7 the symptoms have resolved.  Follow-up outpatient. 17.  Diastolic and systolic congestive heart failure.  Monitor for any signs of fluid overload Cathlyn Parsons, PA-C 11/23/2021

## 2021-11-19 NOTE — Progress Notes (Signed)
OT Cancellation Note  Patient Details Name: Susan Horton MRN: [id number] DOB: Mar 31, 1970   Cancelled Treatment:    Reason Eval/Treat Not Completed: Patient declined, no reason specified (Patient fatigued from late HD and procedure this am. Patient also nauseous and vomiting.) Lodema Hong, [facility]  [facility] [place] 11/19/2021, 12:35 PM

## 2021-11-19 NOTE — Progress Notes (Signed)
[facility] Progress Note   Subjective:   Nauseous this AM. Reports hearing comes and goes. Denies SOB, CP, dizziness and and headache.   Objective Vitals:   11/18/21 2355 11/19/21 0010 11/19/21 0523 11/19/21 0800  BP:  122/60 (!) 111/50   Pulse:  100 99 90  Resp:  '16 18 17  '$ Temp:  99.6 F (37.6 C) 98.5 F (36.9 C) 98.9 F (37.2 C)  TempSrc:  Oral Oral Oral  SpO2:  98% 98% 92%  Weight: 63.9 kg  62 kg    Physical Exam General: Alert female, NAD Heart: RRR, no murmurs, rubs or gallops Lungs: CTA bilaterally Abdomen: Non-distended, +BS Extremities: no peripheral edema Dialysis Access: LUE AVF + bruit  Additional Objective Labs: Basic Metabolic Panel: Recent Labs  Lab 11/16/21 0550 11/17/21 0821 11/18/21 0500  NA 136 134* 134*  K 4.4 3.9 3.4*  CL 95* 95* 94*  CO2 '24 25 25  '$ GLUCOSE 94 145* 107*  BUN 46* 25* 30*  CREATININE 6.60* 4.86* 5.60*  CALCIUM 8.7* 8.6* 8.6*  PHOS 8.0* 7.2* 7.7*   Liver Function Tests: Recent Labs  Lab 11/16/21 0550 11/17/21 0821 11/18/21 0500  ALBUMIN 1.8* 1.8* 1.8*   No results for input(s): "LIPASE", "AMYLASE" in the last 168 hours. CBC: Recent Labs  Lab 11/15/21 0547 11/16/21 0500 11/17/21 0821  WBC 17.0* 17.3* 17.6*  HGB 8.1* 8.2* 7.5*  HCT 28.0* 28.4* 25.5*  MCV 81.2 82.3 80.2  PLT 500* 512* 415*   Blood Culture    Component Value Date/Time   SDES WOUND HIP 11/12/2021 1425   SPECREQUEST  LEFT 11/12/2021 1425   CULT  11/12/2021 1425    NO GROWTH 2 DAYS Performed at [facility], [place] [address], [place], [place] [address]    REPTSTATUS 11/14/2021 FINAL 11/12/2021 1425    Cardiac Enzymes: No results for input(s): "CKTOTAL", "CKMB", "CKMBINDEX", "TROPONINI" in the last 168 hours. CBG: Recent Labs  Lab 11/18/21 0832 11/18/21 1236 11/18/21 1626 11/19/21 0047 11/19/21 0815  GLUCAP 89 103* 149* 88 104*   Iron Studies: No results for input(s): "IRON", "TIBC", "TRANSFERRIN", "FERRITIN" in  the last 72 hours. '@lablastinr3'$ @ Studies/Results: CT HEAD WO CONTRAST (5MM)  Result Date: 11/19/2021 CLINICAL DATA:  51 year old female with weakness, acute neurologic deficit. EXAM: CT HEAD WITHOUT CONTRAST TECHNIQUE: Contiguous axial images were obtained from the base of the skull through the vertex without intravenous contrast. RADIATION DOSE REDUCTION: This exam was performed according to the departmental dose-optimization program which includes automated exposure control, adjustment of the mA and/or kV according to patient size and/or use of iterative reconstruction technique. COMPARISON:  Brain MRI 01/13/2019.  Head CT 11/08/2021. FINDINGS: Brain: Cerebral volume remains normal for age. No midline shift, ventriculomegaly, mass effect, evidence of mass lesion, intracranial hemorrhage or evidence of cortically based acute infarction. Gray-white matter differentiation remains largely normal for age. Subtle chronic lacunar infarct of the posterior left corona radiata, and heterogeneity in the brainstem, better demonstrated on MRI. Vascular: Calcified atherosclerosis at the skull base. No suspicious intracranial vascular hyperdensity. Skull: No acute osseous abnormality identified. Sinuses/Orbits: Visualized paranasal sinuses and mastoids are stable and well aerated. Other: Calcified scalp vessel atherosclerosis. Leftward gaze. No other orbit or scalp soft tissue finding. IMPRESSION: 1. No acute intracranial abnormality and stable non contrast CT appearance of the brain since last month. Mild chronic small vessel disease better demonstrated by MRI. 2. Calcified scalp vessel atherosclerosis, frequently observed in end stage renal disease. Electronically Signed   By:  Genevie Ann M.D.   On: 11/19/2021 08:13   Medications:  sodium chloride      (feeding supplement) PROSource Plus  30 mL Oral BID BM   aspirin  81 mg Oral Daily   Chlorhexidine Gluconate Cloth  6 each Topical Q0600   clopidogrel  75 mg Oral Q  breakfast   darbepoetin (ARANESP) injection - NON-DIALYSIS  200 mcg Subcutaneous Q Mon-1800   doxercalciferol  7 mcg Intravenous Q M,W,F-HD   fluticasone furoate-vilanterol  1 puff Inhalation Daily   heparin  5,000 Units Subcutaneous Q8H   insulin aspart  0-5 Units Subcutaneous QHS   insulin aspart  0-6 Units Subcutaneous TID WC   lanthanum  1,000 mg Oral TID WC   lidocaine  1 patch Transdermal Q24H   metoprolol succinate  25 mg Oral Daily   montelukast  10 mg Oral QHS   pantoprazole (PROTONIX) IV  40 mg Intravenous QHS   sodium chloride flush  3 mL Intravenous Q12H   thiamine (VITAMIN B1) injection  100 mg Intravenous Daily    Outpatient Dialysis Orders: [facility] 3h 45mn 400/600  61kg  2/2 bath P2  Hep 4000  LUE AVF - mircera 200 mcg IV q2, last 10/16, due 10/30 - venofer '100mg'$  IV tiw thru 11/06 - doxercalciferol 7 ug tiw - sod thiosulfate 25 gm iv tiw  Assessment/Plan: Septic shock: Febrile - ?hip infection v. HCAP, was on Vanc/Cefepime/Flagyl -> finishing course of Zosyn. 2. ESRD: Required CRRT on admit due to AMS, high BUN, etc. Now back to iHD nd tolerating well. Still had her temp cath- removed yesterday.  Uses L AVF for dialysis. 3. Encephalopathy: D/t shock/?meds -improved but now with decreased hearing and foot drop 11/7- now spontaneously improving. No offending meds identified.  4. Myoclonus: D/t meds, severe (cefepime, gabapentin and narcotics).  5. Hyperkalemia: On admit, resolved with dialysis. 6. HTN/volume: BP controlled, euvolemic on exam but volume up per weights, UF as tolerated tomorrow. 7. Anemia of ESRD: Hgb 7.5 - continue max dose Aranesp 2080m weekly while here. 8. Secondary HPTH: CorrCa ok, Phos high - restarted her usual outpatient binder - Fosrenol '1000mg'$  TID.  9. Nutrition: Alb low, continue supplements. 10. 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 [facility] op note described 1 cup liquid being removed. Per our imaging her, size appears the same which is unfortunate. re-cultured 11/2 - NGTD. 11. T2DM with severe gastroparesis: Ongoing issue, s/p esophageal myotomy 09/17/21. Has issues swallow/keeping pills down. 12. L foot pain; Xray negative, uric acid normal. VVS consulted, s/p LE angiography   SaAnice PaganiniPA-C 11/19/2021, 10:57 AM  [facility] Pager: [phone]

## 2021-11-19 NOTE — Plan of Care (Signed)
  Problem: Fluid Volume: Goal: Hemodynamic stability will improve Outcome: Progressing   Problem: Clinical Measurements: Goal: Diagnostic test results will improve Outcome: Progressing Goal: Signs and symptoms of infection will decrease Outcome: Progressing   Problem: Respiratory: Goal: Ability to maintain adequate ventilation will improve Outcome: Progressing   Problem: Education: Goal: Ability to describe self-care measures that may prevent or decrease complications (Diabetes Survival Skills Education) will improve Outcome: Progressing Goal: Individualized Educational Video(s) Outcome: Progressing   Problem: Coping: Goal: Ability to adjust to condition or change in health will improve Outcome: Progressing   Problem: Fluid Volume: Goal: Ability to maintain a balanced intake and output will improve Outcome: Progressing   Problem: Health Behavior/Discharge Planning: Goal: Ability to identify and utilize available resources and services will improve Outcome: Progressing Goal: Ability to manage health-related needs will improve Outcome: Progressing   Problem: Metabolic: Goal: Ability to maintain appropriate glucose levels will improve Outcome: Progressing   Problem: Nutritional: Goal: Maintenance of adequate nutrition will improve Outcome: Progressing Goal: Progress toward achieving an optimal weight will improve Outcome: Progressing   Problem: Skin Integrity: Goal: Risk for impaired skin integrity will decrease Outcome: Progressing   Problem: Tissue Perfusion: Goal: Adequacy of tissue perfusion will improve Outcome: Progressing   Problem: Education: Goal: Knowledge of General Education information will improve Description: Including pain rating scale, medication(s)/side effects and non-pharmacologic comfort measures Outcome: Progressing   Problem: Health Behavior/Discharge Planning: Goal: Ability to manage health-related needs will improve Outcome:  Progressing   Problem: Clinical Measurements: Goal: Ability to maintain clinical measurements within normal limits will improve Outcome: Progressing Goal: Will remain free from infection Outcome: Progressing Goal: Diagnostic test results will improve Outcome: Progressing Goal: Respiratory complications will improve Outcome: Progressing Goal: Cardiovascular complication will be avoided Outcome: Progressing   Problem: Activity: Goal: Risk for activity intolerance will decrease Outcome: Progressing   Problem: Nutrition: Goal: Adequate nutrition will be maintained Outcome: Progressing   Problem: Coping: Goal: Level of anxiety will decrease Outcome: Progressing   Problem: Elimination: Goal: Will not experience complications related to bowel motility Outcome: Progressing Goal: Will not experience complications related to urinary retention Outcome: Progressing   Problem: Pain Managment: Goal: General experience of comfort will improve Outcome: Progressing   Problem: Safety: Goal: Ability to remain free from injury will improve Outcome: Progressing   Problem: Skin Integrity: Goal: Risk for impaired skin integrity will decrease Outcome: Progressing   Problem: Education: Goal: Understanding of CV disease, CV risk reduction, and recovery process will improve Outcome: Progressing Goal: Individualized Educational Video(s) Outcome: Progressing   Problem: Activity: Goal: Ability to return to baseline activity level will improve Outcome: Progressing   Problem: Cardiovascular: Goal: Ability to achieve and maintain adequate cardiovascular perfusion will improve Outcome: Progressing Goal: Vascular access site(s) Level 0-1 will be maintained Outcome: Progressing   Problem: Health Behavior/Discharge Planning: Goal: Ability to safely manage health-related needs after discharge will improve Outcome: Progressing

## 2021-11-19 NOTE — Progress Notes (Signed)
PT Cancellation Note  Patient Details Name: Susan Horton MRN: [id number] DOB: [birth year]   Cancelled Treatment:    Reason Eval/Treat Not Completed: (P) Fatigue/lethargy limiting ability to participate. Pt refused due to long bout of nausea this morning and desire to rest. Pt request follow back this afternoon. PT will be back as schedule allows.  Lugenia Assefa B. Migdalia Dk PT, DPT Acute Rehabilitation Services Please use secure chat or  Call Office [phone]    [place] 11/19/2021, 10:18 AM

## 2021-11-20 DIAGNOSIS — N186 End stage renal disease: Secondary | ICD-10-CM | POA: Diagnosis not present

## 2021-11-20 DIAGNOSIS — L0390 Cellulitis, unspecified: Secondary | ICD-10-CM | POA: Diagnosis not present

## 2021-11-20 DIAGNOSIS — G9341 Metabolic encephalopathy: Secondary | ICD-10-CM | POA: Diagnosis not present

## 2021-11-20 DIAGNOSIS — A419 Sepsis, unspecified organism: Secondary | ICD-10-CM | POA: Diagnosis not present

## 2021-11-20 LAB — RENAL FUNCTION PANEL
Albumin: 1.7 g/dL — ABNORMAL LOW (ref 3.5–5.0)
Anion gap: 14 (ref 5–15)
BUN: 25 mg/dL — ABNORMAL HIGH (ref 6–20)
CO2: 26 mmol/L (ref 22–32)
Calcium: 8.7 mg/dL — ABNORMAL LOW (ref 8.9–10.3)
Chloride: 96 mmol/L — ABNORMAL LOW (ref 98–111)
Creatinine, Ser: 4.79 mg/dL — ABNORMAL HIGH (ref 0.44–1.00)
GFR, Estimated: 10 mL/min — ABNORMAL LOW (ref >60)
Glucose, Bld: 137 mg/dL — ABNORMAL HIGH (ref 70–99)
Phosphorus: 6.9 mg/dL — ABNORMAL HIGH (ref 2.5–4.6)
Potassium: 3.2 mmol/L — ABNORMAL LOW (ref 3.5–5.1)
Sodium: 136 mmol/L (ref 135–145)

## 2021-11-20 LAB — CBC
HCT: 24.6 % — ABNORMAL LOW (ref 36.0–46.0)
Hemoglobin: 7.1 g/dL — ABNORMAL LOW (ref 12.0–15.0)
MCH: 24 pg — ABNORMAL LOW (ref 26.0–34.0)
MCHC: 28.9 g/dL — ABNORMAL LOW (ref 30.0–36.0)
MCV: 83.1 fL (ref 80.0–100.0)
Platelets: 419 10*3/uL — ABNORMAL HIGH (ref 150–400)
RBC: 2.96 MIL/uL — ABNORMAL LOW (ref 3.87–5.11)
RDW: 20.7 % — ABNORMAL HIGH (ref 11.5–15.5)
WBC: 21.3 10*3/uL — ABNORMAL HIGH (ref 4.0–10.5)
nRBC: 0 % (ref 0.0–0.2)

## 2021-11-20 LAB — GLUCOSE, CAPILLARY
Glucose-Capillary: 141 mg/dL — ABNORMAL HIGH (ref 70–99)
Glucose-Capillary: 93 mg/dL (ref 70–99)
Glucose-Capillary: 141 mg/dL — ABNORMAL HIGH (ref 70–99)
Glucose-Capillary: 128 mg/dL — ABNORMAL HIGH (ref 70–99)

## 2021-11-20 MED ORDER — OXYCODONE HCL 5 MG PO TABS
10.0000 mg | ORAL_TABLET | ORAL | Status: DC | PRN
  Administered 2021-11-20 – 2021-11-23 (×9): 15 mg via ORAL
  Filled 2021-11-20 (×9): qty 3

## 2021-11-20 MED ORDER — HYDROMORPHONE HCL 1 MG/ML IJ SOLN
INTRAMUSCULAR | Status: AC
  Filled 2021-11-20: qty 0.5

## 2021-11-20 NOTE — Progress Notes (Signed)
Occupational Therapy Treatment Patient Details Name: Susan Horton MRN: [id number] DOB: 1970-08-05 Today's Date: 11/20/2021   History of present illness 51 y/o female presented to ED on 11/08/21 for progressive fatigue and weakness after fall on 10/27. Admitted for septic shock with unclear origin. Recent L hip tumoral calcinosis surgery a few weeks ago at [facility] with associated drainage from L hip. Underwent  stent of left common iliac artery with atherectomy and DCB of the left common femoral artery and stent of the left SFA in the setting of multilevel high-grade stenosis with rest pain on 11/6.  PMH: ESRD, CAD, HTN, CHF, T2DM with peripheral neuropathy and gastroparesis.   OT comments  Patient received in supine and eager to participate and asking for pain meds before beginning. PT present to instruct patient on LLE ankle exercises with therapy band. Patient required max assist to donn sock on RLE and did not want LLE sock due to pain. Patient required increased time to get to EOB with min guard assist. Patient instructed on squat pivot transfer with pillow on floor to support LLE.  Patient was min guard for transfer with verbal cues.  Patient was setup for breakfast in recliner. Acute OT to continue to follow.    Recommendations for follow up therapy are one component of a multi-disciplinary discharge planning process, led by the attending physician.  Recommendations may be updated based on patient status, additional functional criteria and insurance authorization.    Follow Up Recommendations  Acute inpatient rehab (3hours/day)    Assistance Recommended at Discharge Frequent or constant Supervision/Assistance  Patient can return home with the following  A lot of help with bathing/dressing/bathroom;Assist for transportation;Help with stairs or ramp for entrance   Equipment Recommendations  Wheelchair (measurements OT);Wheelchair cushion (measurements OT)    Recommendations for Other  Services      Precautions / Restrictions Precautions Precautions: Fall Precaution Comments: recent hip sx to L hip; left foot drop and bil hearing loss noted 11/7 Restrictions Weight Bearing Restrictions: No       Mobility Bed Mobility Overal bed mobility: Needs Assistance Bed Mobility: Supine to Sit     Supine to sit: HOB elevated, Min guard     General bed mobility comments: increased time due to pain and verbal cues    Transfers Overall transfer level: Needs assistance Equipment used: None Transfers: Bed to chair/wheelchair/BSC     Squat pivot transfers: Min guard       General transfer comment: frequent cues throughout with min guard for safety     Balance Overall balance assessment: Needs assistance Sitting-balance support: Feet supported, No upper extremity supported Sitting balance-Leahy Scale: Fair Sitting balance - Comments: Can sit EOB on her own.                                   ADL either performed or assessed with clinical judgement   ADL Overall ADL's : Needs assistance/impaired Eating/Feeding: Set up;Sitting Eating/Feeding Details (indicate cue type and reason): in recliner                 Lower Body Dressing: Maximal assistance Lower Body Dressing Details (indicate cue type and reason): unable to donn socks on RLE, did not want sock on LLE               General ADL Comments: limited by pain    Extremity/Trunk Assessment  Vision       Perception     Praxis      Cognition Arousal/Alertness: Awake/alert Behavior During Therapy: WFL for tasks assessed/performed Overall Cognitive Status: Within Functional Limits for tasks assessed                                 General Comments: eager to progress, limited by pain        Exercises      Shoulder Instructions       General Comments instructions on exercises to address LLE foot drop    Pertinent Vitals/ Pain        Pain Assessment Pain Assessment: Faces Faces Pain Scale: Hurts even more Pain Location: Lt foot in dependent position Pain Descriptors / Indicators: Grimacing, Guarding, Moaning, Throbbing Pain Intervention(s): Limited activity within patient's tolerance, Monitored during session, Repositioned  Home Living                                          Prior Functioning/Environment              Frequency  Min 2X/week        Progress Toward Goals  OT Goals(current goals can now be found in the care plan section)  Progress towards OT goals: Progressing toward goals  Acute Rehab OT Goals Patient Stated Goal: get better OT Goal Formulation: With patient Time For Goal Achievement: 11/26/21 Potential to Achieve Goals: Good ADL Goals Pt Will Perform Eating: sitting;with min assist Pt Will Perform Grooming: with min assist;sitting Pt Will Perform Upper Body Dressing: with min assist;sitting Pt Will Transfer to Toilet: with mod assist;stand pivot transfer;bedside commode Pt/caregiver will Perform Home Exercise Program: Increased strength;Both right and left upper extremity;With minimal assist  Plan Discharge plan remains appropriate    Co-evaluation                 AM-PAC OT "6 Clicks" Daily Activity     Outcome Measure   Help from another person eating meals?: A Little Help from another person taking care of personal grooming?: A Little Help from another person toileting, which includes using toliet, bedpan, or urinal?: A Lot Help from another person bathing (including washing, rinsing, drying)?: A Lot Help from another person to put on and taking off regular upper body clothing?: A Lot Help from another person to put on and taking off regular lower body clothing?: A Lot 6 Click Score: 14    End of Session Equipment Utilized During Treatment: Gait belt  OT Visit Diagnosis: Unsteadiness on feet (R26.81);Other symptoms and signs involving cognitive  function;History of falling (Z91.81);Muscle weakness (generalized) (M62.81)   Activity Tolerance Patient tolerated treatment well;Patient limited by pain   Patient Left in chair;with call bell/phone within reach;with chair alarm set   Nurse Communication Mobility status        Time: 5462-7035 OT Time Calculation (min): 30 min  Charges: OT General Charges $OT Visit: 1 Visit OT Treatments $Self Care/Home Management : 8-22 mins $Therapeutic Activity: 8-22 mins  Lodema Hong, OTA Acute Rehabilitation Services  Office [phone]   Trixie Dredge 11/20/2021, 9:52 AM

## 2021-11-20 NOTE — Progress Notes (Signed)
Physical Therapy Treatment Patient Details Name: Susan Horton MRN: [id number] DOB: 01-Oct-1970 Today's Date: 11/20/2021   History of Present Illness 51 y/o female presented to ED on 11/08/21 for progressive fatigue and weakness after fall on 10/27. Admitted for septic shock with unclear origin. Recent L hip tumoral calcinosis surgery a few weeks ago at [facility] with associated drainage from L hip. Underwent  stent of left common iliac artery with atherectomy and DCB of the left common femoral artery and stent of the left SFA in the setting of multilevel high-grade stenosis with rest pain on 11/6.  PMH: ESRD, CAD, HTN, CHF, T2DM with peripheral neuropathy and gastroparesis.    PT Comments    Assisted OT with transfer to chair for breakfast. Pt finished breakfast and needing assist for getting back to bed for transfer to HD. Reviewed use of theraband to produce dorsiflexion of L foot not eversion. Pt reports understanding. Pt requiring min A for pivot transfer from recliner back to bed, increased assist required to get to higher surface of the bed. Pt continues to be very motivated to get her PLOF back, asking clarifying questions about AIR admission. PT will continue to see acutely until discharge.   Recommendations for follow up therapy are one component of a multi-disciplinary discharge planning process, led by the attending physician.  Recommendations may be updated based on patient status, additional functional criteria and insurance authorization.  Follow Up Recommendations  Acute inpatient rehab (3hours/day)     Assistance Recommended at Discharge Frequent or constant Supervision/Assistance  Patient can return home with the following A lot of help with walking and/or transfers;A lot of help with bathing/dressing/bathroom;Assistance with cooking/housework;Direct supervision/assist for financial management;Direct supervision/assist for medications management;Assist for transportation;Help with  stairs or ramp for entrance   Equipment Recommendations  Other (comment) (TBD)    Recommendations for Other Services Rehab consult     Precautions / Restrictions Precautions Precautions: Fall Precaution Comments: recent hip sx to L hip; left foot drop and bil hearing loss noted 11/7 Restrictions Weight Bearing Restrictions: No     Mobility  Bed Mobility Overal bed mobility: Needs Assistance Bed Mobility: Sit to Supine       Sit to supine: Min guard   General bed mobility comments: min guard for safety with management of LE back into bed    Transfers Overall transfer level: Needs assistance   Transfers: Bed to chair/wheelchair/BSC       Squat pivot transfers: Min assist     General transfer comment: pt able to sequence pivot transfer from chair back to bed for HD    Ambulation/Gait               General Gait Details: unable due to L foot pain with weightbearing     Balance Overall balance assessment: Needs assistance Sitting-balance support: Feet supported, No upper extremity supported Sitting balance-Leahy Scale: Fair Sitting balance - Comments: Can sit EOB on her own.       Standing balance comment: did not come to fully standing today                            Cognition Arousal/Alertness: Awake/alert Behavior During Therapy: WFL for tasks assessed/performed Overall Cognitive Status: Within Functional Limits for tasks assessed                                 General  Comments: pt with questions about AIR vs SNF        Exercises General Exercises - Lower Extremity Ankle Circles/Pumps: PROM, Left Ankle Circles/Pumps Limitations: utilized red theraband, educated on need for dorsiflexion not everision of ankle when using band to assist ankle flexion    General Comments General comments (skin integrity, edema, etc.): instructions on exercises to address LLE foot drop      Pertinent Vitals/Pain Pain  Assessment Pain Assessment: Faces Faces Pain Scale: Hurts even more Breathing: normal Negative Vocalization: none Facial Expression: smiling or inexpressive Body Language: relaxed Consolability: no need to console PAINAD Score: 0 Pain Location: Lt foot in dependent position Pain Descriptors / Indicators: Grimacing, Guarding, Moaning, Throbbing Pain Intervention(s): Limited activity within patient's tolerance, Monitored during session, Repositioned    Home Living                          Prior Function            PT Goals (current goals can now be found in the care plan section) Acute Rehab PT Goals Patient Stated Goal: get better and back to walking PT Goal Formulation: With patient/family Time For Goal Achievement: 12/01/21 Potential to Achieve Goals: Fair Progress towards PT goals: Progressing toward goals    Frequency    Min 3X/week      PT Plan Current plan remains appropriate    Co-evaluation              AM-PAC PT "6 Clicks" Mobility   Outcome Measure  Help needed turning from your back to your side while in a flat bed without using bedrails?: A Little Help needed moving from lying on your back to sitting on the side of a flat bed without using bedrails?: A Little Help needed moving to and from a bed to a chair (including a wheelchair)?: Total Help needed standing up from a chair using your arms (e.g., wheelchair or bedside chair)?: Total Help needed to walk in hospital room?: Total Help needed climbing 3-5 steps with a railing? : Total 6 Click Score: 10    End of Session Equipment Utilized During Treatment: Gait belt Activity Tolerance: Patient limited by pain Patient left: in bed;Other (comment) (being transferred to HD at end of session) Nurse Communication: Mobility status PT Visit Diagnosis: Unsteadiness on feet (R26.81);Muscle weakness (generalized) (M62.81);History of falling (Z91.81);Difficulty in walking, not elsewhere classified  (R26.2);Other symptoms and signs involving the nervous system (Q68.341)     Time: 9622-2979 PT Time Calculation (min) (ACUTE ONLY): 20 min  Charges:  $Therapeutic Activity: 8-22 mins                     Teirra Carapia B. Migdalia Dk PT, DPT Acute Rehabilitation Services Please use secure chat or  Call Office [phone]    [place] 11/20/2021, 10:21 AM

## 2021-11-20 NOTE — Progress Notes (Signed)
PROGRESS NOTE  Susan Horton (DOB 06/01/1970) QJF:[id number]   PCP: Charlott Rakes, MD   Brief Narrative:  51 year old woman, former smoker, with end-stage renal disease on HD, CAD, hypertension with systolic and diastolic CHF, diabetes complicated by peripheral neuropathy and gastroparesis, lower extremity DVT, asthma.  She underwent recent excision of tumoral calcinosis left hip/thigh, drain and sutures removed on 10/20/2021 without any evidence of infection per office notes.    She was brought to the emergency department 10/29 with 3 days of progressive fatigue, weakness.  She apparently suffered a mechanical fall on 10/27 with resultant R hip pain.  In the ED noted to be febrile, lethargic/confused, tachycardic.  Slowly improving in mentation. Stay complicated by left foot pain needing angiogram.  And now left foot drop and hearing impairment. Neuroimaging negative.   Assessment and Plan: Acute metabolic encephalopathy: Unclear exact precipitant but has resolved. ?if related to sepsis or medications or uremia.  - Monitor  Abrupt onset bilateral sensorineural hearing loss: Confirmed by audiology 11/7, though symptom has actually resolved. Visualization of canal essentially clear-- no bulging of TM. No acute abnormality on head CT. - Avoid ototoxins, follow up with audiology after discharge.  Left foot pain: Negative XR, Uric acid wnl.  - Continue lidoderm - Vascular consult:  left-sided SFA stent may be occluded due to high-grade stenosis and recent pressor requirement at time of admission  -s/p angiogram with improvement in pain  PAD: s/p remote left EIA, SFA stent, known stenosis with no toe pressure bilaterally, noncompressible R ABI. In the setting of shock and pressor requirement, left SFA stent occlusion was suspected to be cause of left foot pain. Underwent left common iliac artery atherectomy and and stent, DCB of the left common femoral artery and stent of the left SFA in the  setting of multilevel high-grade stenosis this admission on 11/6.  - Continue medications as ordered.   Left foot drop: Nonacute head CT.  - AFO recommended    Septic shock: Shock resolved with antibiotics empirically. Initially concerned for possible bacteremia in patient with vascular access, on HD. Cultures done after abx are NGTD.  - CT Hip with some soft tissue edema which might reflect mild cellulitis. Wound draining but no exudate, culture negative. Wound continues to appear uninfected.  - Completed broad abx 10/29 - 11/6. Remains with leukocytosis though wound appears stable and she's afebrile. Slightly up today. Despite her very difficult IV access and ability to draw labs, we need to discontinue the HD cath. Ordered this AM. Check blood cultures if she develops a fever, though index of suspicion for infection remains low currently. Without the rising leukocytosis declaring its etiology, I believe it's prudent to delay CIR admission at this time.    Biventricular heart failure, severe TR (new), pulmonary HTN:  - Cardiology signed off, suspecting the PA pressure elevation led to worsening of previously moderate TR. No further work up currently recommended.   CAD s/p mLAD PCI w DES (2018), HTN, HLD: LVEF 45-50%, LV global hypokinesis. RV moderately reduced systolic fxn, moderately enlarged RV size, mildly elevated PASP. RVSP 43. - DAPT  - Volume management per RRT    ESRD:  - Continue HD per nephrology  Hyperkalemia: Resolved with HD.  Hyperphosphatemia   Uremia: Resolved with HD  Anemia of ESRD: Ferritin 5k, TIBC low, S93, folic acid wnl.  - ESA deferred to nephrology. Continues to have no bleeding or symptoms attributable to anemia. - Monitor and transfuse prn hgb < 7g/dl. Recheck at  next HD.   Asthma without exacerbation  - Continue breo ellipta, singulair, prn BD.   T2DM with diabetic neuropathy/gastroparesis: HbA1c 5% - Continue SSI    Tumoral calcinosis: Excision from  left thigh performed at [facility] 9/29. Cx NGTD. - Continue wound care per orders.  - Will need to follow up with WF surgery (was planning to follow up 6 weeks postop which is 11/10).    Consultants:  PCCM Renal Vascular  Subjective: Tolerating diet better, she has emesis from time to time from gastroparesis and states it is much better currently than it has been in the past. She denies fever or pain anywhere new. Foot pain is actually somewhat improved. Tolerating dialysis. No neck pain/stiffness, respiratory complaints or chest or abdominal pains.  Objective: BP (!) 124/57 (BP Location: Left Arm)   Pulse 98   Temp 97.8 F (36.6 C) (Oral)   Resp 16   Wt 64.7 kg   SpO2 100%   BMI 23.74 kg/m   Gen: Nontoxic female in no distress Pulm: Clear, nonlabored  CV: RRR, no MRG or LE edema GI: Soft, NT, ND, +BS  Neuro: Alert and oriented. No new focal deficits. Stable left foot drop and tenderness without visible or palpable deformity. Ext: Warm, dry Skin: Left thigh incision with serous drainage, stable, no exudate or surrounding erythema. No other/new rashes, lesions or ulcers on visualized skin. Right IJ HD cath site is nontender without erythema or discharge.  Family Communication: None at bedside  Disposition Plan: CIR when leukocytosis improves.   Time spent: 45 minutes spent on chart review, discussion with nursing staff, consultants, updating family and interview/physical exam; more than 50% of that time was spent in counseling and/or coordination of care.    Patrecia Pour, MD [facility] Available via Epic secure chat 7am-7pm After these hours, please refer to coverage provider listed on [email or web address] 11/20/2021, 4:06 PM

## 2021-11-20 NOTE — Progress Notes (Signed)
Inpatient Rehabilitation Admissions Coordinator   I await medical clearance to plan admit to CIR. Noted WBC 21.3 today.  Danne Baxter, RN, MSN Rehab Admissions Coordinator [phone] 11/20/2021 12:50 PM

## 2021-11-20 NOTE — Procedures (Signed)
HD Note:  Some information was entered later than the data was gathered due to patient care needs. The stated time with the data is accurate.Received patient in bed to unit.  Alert and oriented.  Informed consent signed and in chart.   Patient arrived on the unit with 8 out of 10 pain in her left foot.  She had been given something earlier and we discussed how it would be taken out during treatment.  She decided to wait until after treatment to take more medication.  Her foot is elevated on pillows.  Pain medication, see MAR, was given about 20 min prior to the end of dialysis for pain in the left foot that had increased.  Patient experienced an episode of symptomatic hypotension.  She stated she was dizzy and felt faint. She refused extra fluids and UF was turned off until her SBP was above 100 and she was asymptomatic.  Transported back to the room   Alert, without acute distress.  Hand-off given to patient's nurse.   Access used: Left upper arm fistula Access issues: none  Total UF removed: 200 ml     [facility]

## 2021-11-20 NOTE — Consult Note (Signed)
Reason for Consult:Left thigh wound Referring Physician: Vance Gather Time called: [id number] Time at bedside: Sidman is an 51 y.o. female.  HPI: Susan Horton was admitted almost 2w ago. She had undergone excision of tumoral calcinosis from her left thigh at [facility] on 9/29. She continues to have significant discharge from the wounds. Aside from causing inconvenience they aren't really bothering her but she's having to change the dressing 2/2 saturation about q2h at home.  Past Medical History:  Diagnosis Date   Abnormal stress test    Anemia    Angio-edema    Asthma    CAD (coronary artery disease)    DES to mid LAD July 2018, residual moderate RCA disease   Cataract    CKD (chronic kidney disease) stage 4, GFR 15-29 ml/min (HCC)    Dialysis T/Th/Sa   DVT (deep venous thrombosis) ([place])    1996 during pregnancy, 2015 left leg   Eczema    Essential hypertension 12/11/2015   Gastroparesis    GERD (gastroesophageal reflux disease)    Gluteal abscess 12/27/2018   Hidradenitis    Migraine    Neuropathy    Peripheral vascular disease (HCC)    blood clot in leg   Progressive angina ([place]) 06/05/2014   Chest pain   Type 2 diabetes mellitus ([place])    Type II diabetes mellitus ([place])    Urticaria     Past Surgical History:  Procedure Laterality Date   A/V FISTULAGRAM N/A 06/22/2017   Procedure: A/V FISTULAGRAM - left arm;  Surgeon: [name], MD;  Location: [place] [facility];  Service: Cardiovascular;  Laterality: N/A;   ABDOMINAL AORTOGRAM W/LOWER EXTREMITY Bilateral 12/14/2018   Procedure: ABDOMINAL AORTOGRAM W/LOWER EXTREMITY;  Surgeon: [name], MD;  Location: [place] [facility];  Service: Cardiovascular;  Laterality: Bilateral;   ABDOMINAL AORTOGRAM W/LOWER EXTREMITY N/A 04/25/2019   Procedure: ABDOMINAL AORTOGRAM W/LOWER EXTREMITY;  Surgeon: [name], MD;  Location: [place] [facility];  Service: Cardiovascular;  Laterality: N/A;   ABDOMINAL  AORTOGRAM W/LOWER EXTREMITY Left 07/18/2019   Procedure: ABDOMINAL AORTOGRAM W/LOWER EXTREMITY;  Surgeon: [name], MD;  Location: [place] [facility];  Service: Cardiovascular;  Laterality: Left;   ABDOMINAL AORTOGRAM W/LOWER EXTREMITY N/A 11/16/2021   Procedure: ABDOMINAL AORTOGRAM W/LOWER EXTREMITY;  Surgeon: [name], MD;  Location: [place] [facility];  Service: Cardiovascular;  Laterality: N/A;   ABDOMINOPLASTY     ADENOIDECTOMY     APPENDECTOMY  1995   AV FISTULA PLACEMENT Left 02/01/2017   Procedure: ARTERIOVENOUS BRACHIOCEPHALIC (AV) FISTULA CREATION;  Surgeon: [name], MD;  Location[facility];  Service: Vascular;  Laterality: Left;   [place] INTERVENTION N/A 08/05/2016   Procedure: Coronary Stent Intervention;  Surgeon: [name], MD;  Location: [place] [facility];  Service: Cardiovascular;  Laterality: N/A;   ESOPHAGOGASTRODUODENOSCOPY (EGD) WITH PROPOFOL N/A 07/01/2020   Procedure: ESOPHAGOGASTRODUODENOSCOPY (EGD) WITH PROPOFOL;  Surgeon: Gatha Mayer, MD;  Location: [place];  Service: Endoscopy;  Laterality: N/A;   EXPLORATORY LAPAROTOMY  08/14/2005   lysis of adhesions, drainage of tubo-ovarian abscess   FISTULA SUPERFICIALIZATION Left 09/14/2017   Procedure: FISTULA SUPERFICIALIZATION ARTERIOVENOUS FISTULA LEFT ARM;  Surgeon: [name], MD;  Location: [place];  Service: Vascular;  Laterality: Left;   FLEXIBLE SIGMOIDOSCOPY N/A 01/04/2019   Procedure: FLEXIBLE SIGMOIDOSCOPY;  Surgeon: Clarene Essex, MD;  Location: [facility];  Service: Endoscopy;  Laterality: N/A;   HYDRADENITIS EXCISION  02/08/2011   Procedure: EXCISION HYDRADENITIS GROIN;  Surgeon: [name], MD;  Location: [place];  Service: General;  Laterality: N/A;  Excisioin of Hidradenitis Left groin   INCISION AND DRAINAGE ABSCESS N/A 01/11/2019   Procedure: INCISION AND  DRAINAGE SACRAL ABSCESS;  Surgeon: Georganna Skeans, MD;  Location: [place];  Service: General;  Laterality: N/A;   INGUINAL HIDRADENITIS EXCISION  07/06/2010   bilateral   INSERTION OF DIALYSIS CATHETER N/A 09/14/2017   Procedure: INSERTION OF TUNNELED DIALYSIS CATHETER Right Internal Jugular;  Surgeon: [name], MD;  Location: [place];  Service: Vascular;  Laterality: N/A;   IR FLUORO GUIDE CV LINE RIGHT  03/01/2019   IR US GUIDE BX ASP/DRAIN  01/01/2019   IR US GUIDE VASC ACCESS RIGHT  03/01/2019   LEFT HEART CATH AND CORONARY ANGIOGRAPHY N/A 08/05/2016   Procedure: Left Heart Cath and Coronary Angiography;  Surgeon: [name], MD;  Location: [place] [facility];  Service: Cardiovascular;  Laterality: N/A;   PERIPHERAL VASCULAR INTERVENTION Left 12/14/2018   Procedure: PERIPHERAL VASCULAR INTERVENTION;  Surgeon: [name], MD;  Location: [place] [facility];  Service: Cardiovascular;  Laterality: Left;  SFA   PERIPHERAL VASCULAR INTERVENTION Left 04/25/2019   Procedure: PERIPHERAL VASCULAR INTERVENTION;  Surgeon: [name], MD;  Location: [place] [facility];  Service: Cardiovascular;  Laterality: Left;  right superficial femoral, and left iliac   PERIPHERAL VASCULAR INTERVENTION Left 07/18/2019   Procedure: PERIPHERAL VASCULAR INTERVENTION;  Surgeon: [name], MD;  Location: [place] [facility];  Service: Cardiovascular;  Laterality: Left;  external iliac   REDUCTION MAMMAPLASTY  2002   TONSILLECTOMY     TUBAL LIGATION  1996   VAGINAL HYSTERECTOMY  08/04/2005   and cysto    Family History  Problem Relation Age of Onset   Allergic rhinitis Mother    Hypertension Father    Allergic rhinitis Father    Hypertension Sister    Allergic rhinitis Sister    Hypertension Brother    Allergic rhinitis Brother    Breast cancer Cousin        x2    Social History:  reports that she quit smoking about 5 years ago. Her smoking use included cigarettes. She has  never used smokeless tobacco. She reports current drug use. Frequency: 2.00 times per week. Drug: Marijuana. She reports that she does not drink alcohol.  Allergies:  Allergies  Allergen Reactions   Repatha [Evolocumab] Itching   Lisinopril Cough    Medications: I have reviewed the patient's current medications.  Results for orders placed or performed during the hospital encounter of 11/08/21 (from the past 48 hour(s))  Glucose, capillary     Status: Abnormal   Collection Time: 11/18/21 12:36 PM  Result Value Ref Range   Glucose-Capillary 103 (H) 70 - 99 mg/dL    Comment: Glucose reference range applies only to samples taken after fasting for at least 8 hours.  Glucose, capillary     Status: Abnormal   Collection Time: 11/18/21  4:26 PM  Result Value Ref Range   Glucose-Capillary 149 (H) 70 - 99 mg/dL    Comment: Glucose reference range applies only to samples taken after fasting for at least 8 hours.  Glucose, capillary     Status: None   Collection Time: 11/19/21 12:47 AM  Result Value Ref Range   Glucose-Capillary 88 70 -  99 mg/dL    Comment: Glucose reference range applies only to samples taken after fasting for at least 8 hours.  Glucose, capillary     Status: Abnormal   Collection Time: 11/19/21  8:15 AM  Result Value Ref Range   Glucose-Capillary 104 (H) 70 - 99 mg/dL    Comment: Glucose reference range applies only to samples taken after fasting for at least 8 hours.  Glucose, capillary     Status: Abnormal   Collection Time: 11/19/21 12:09 PM  Result Value Ref Range   Glucose-Capillary 114 (H) 70 - 99 mg/dL    Comment: Glucose reference range applies only to samples taken after fasting for at least 8 hours.  Glucose, capillary     Status: Abnormal   Collection Time: 11/19/21  4:37 PM  Result Value Ref Range   Glucose-Capillary 108 (H) 70 - 99 mg/dL    Comment: Glucose reference range applies only to samples taken after fasting for at least 8 hours.  Renal function  panel     Status: Abnormal   Collection Time: 11/19/21  5:55 PM  Result Value Ref Range   Sodium 135 135 - 145 mmol/L   Potassium 3.3 (L) 3.5 - 5.1 mmol/L   Chloride 94 (L) 98 - 111 mmol/L   CO2 23 22 - 32 mmol/L   Glucose, Bld 106 (H) 70 - 99 mg/dL    Comment: Glucose reference range applies only to samples taken after fasting for at least 8 hours.   BUN 18 6 - 20 mg/dL   Creatinine, Ser 4.07 (H) 0.44 - 1.00 mg/dL   Calcium 8.9 8.9 - 10.3 mg/dL   Phosphorus 6.5 (H) 2.5 - 4.6 mg/dL   Albumin 1.9 (L) 3.5 - 5.0 g/dL   GFR, Estimated 13 (L) >60 mL/min    Comment: (NOTE) Calculated using the CKD-EPI Creatinine Equation (2021)    Anion gap 18 (H) 5 - 15    Comment: Performed at [facility] [address] [place], [place] [address]  Glucose, capillary     Status: Abnormal   Collection Time: 11/19/21  9:28 PM  Result Value Ref Range   Glucose-Capillary 169 (H) 70 - 99 mg/dL    Comment: Glucose reference range applies only to samples taken after fasting for at least 8 hours.  Renal function panel     Status: Abnormal   Collection Time: 11/20/21  5:00 AM  Result Value Ref Range   Sodium 136 135 - 145 mmol/L   Potassium 3.2 (L) 3.5 - 5.1 mmol/L   Chloride 96 (L) 98 - 111 mmol/L   CO2 26 22 - 32 mmol/L   Glucose, Bld 137 (H) 70 - 99 mg/dL    Comment: Glucose reference range applies only to samples taken after fasting for at least 8 hours.   BUN 25 (H) 6 - 20 mg/dL   Creatinine, Ser 4.79 (H) 0.44 - 1.00 mg/dL   Calcium 8.7 (L) 8.9 - 10.3 mg/dL   Phosphorus 6.9 (H) 2.5 - 4.6 mg/dL   Albumin 1.7 (L) 3.5 - 5.0 g/dL   GFR, Estimated 10 (L) >60 mL/min    Comment: (NOTE) Calculated using the CKD-EPI Creatinine Equation (2021)    Anion gap 14 5 - 15    Comment: Performed at [facility] [address] [place], [place] [address]  CBC     Status: Abnormal   Collection Time: 11/20/21  5:00 AM  Result Value Ref Range   WBC  21.3 (H) 4.0 - 10.5 K/uL   RBC 2.96 (L) 3.87 - 5.11  MIL/uL   Hemoglobin 7.1 (L) 12.0 - 15.0 g/dL   HCT 24.6 (L) 36.0 - 46.0 %   MCV 83.1 80.0 - 100.0 fL   MCH 24.0 (L) 26.0 - 34.0 pg   MCHC 28.9 (L) 30.0 - 36.0 g/dL   RDW 20.7 (H) 11.5 - 15.5 %   Platelets 419 (H) 150 - 400 K/uL   nRBC 0.0 0.0 - 0.2 %    Comment: Performed at [facility], [place] [address] [place], [place] [address]  Glucose, capillary     Status: Abnormal   Collection Time: 11/20/21  8:01 AM  Result Value Ref Range   Glucose-Capillary 141 (H) 70 - 99 mg/dL    Comment: Glucose reference range applies only to samples taken after fasting for at least 8 hours.    CT HEAD WO CONTRAST (5MM)  Result Date: 11/19/2021 CLINICAL DATA:  51 year old female with weakness, acute neurologic deficit. EXAM: CT HEAD WITHOUT CONTRAST TECHNIQUE: Contiguous axial images were obtained from the base of the skull through the vertex without intravenous contrast. RADIATION DOSE REDUCTION: This exam was performed according to the departmental dose-optimization program which includes automated exposure control, adjustment of the mA and/or kV according to patient size and/or use of iterative reconstruction technique. COMPARISON:  Brain MRI 01/13/2019.  Head CT 11/08/2021. FINDINGS: Brain: Cerebral volume remains normal for age. No midline shift, ventriculomegaly, mass effect, evidence of mass lesion, intracranial hemorrhage or evidence of cortically based acute infarction. Gray-white matter differentiation remains largely normal for age. Subtle chronic lacunar infarct of the posterior left corona radiata, and heterogeneity in the brainstem, better demonstrated on MRI. Vascular: Calcified atherosclerosis at the skull base. No suspicious intracranial vascular hyperdensity. Skull: No acute osseous abnormality identified. Sinuses/Orbits: Visualized paranasal sinuses and mastoids are stable and well aerated. Other: Calcified scalp vessel atherosclerosis. Leftward gaze. No other orbit or scalp soft tissue  finding. IMPRESSION: 1. No acute intracranial abnormality and stable non contrast CT appearance of the brain since last month. Mild chronic small vessel disease better demonstrated by MRI. 2. Calcified scalp vessel atherosclerosis, frequently observed in end stage renal disease. Electronically Signed   By: Genevie Ann M.D.   On: 11/19/2021 08:13    Review of Systems  Constitutional:  Positive for fatigue.  HENT:  Negative for ear discharge, ear pain, hearing loss and tinnitus.   Eyes:  Negative for photophobia and pain.  Respiratory:  Negative for cough and shortness of breath.   Cardiovascular:  Negative for chest pain.  Gastrointestinal:  Negative for abdominal pain, nausea and vomiting.  Genitourinary:  Negative for dysuria, flank pain, frequency and urgency.  Musculoskeletal:  Negative for back pain, myalgias and neck pain.  Skin:  Positive for wound (Left thigh).  Neurological:  Negative for dizziness and headaches.  Hematological:  Does not bruise/bleed easily.  Psychiatric/Behavioral:  The patient is not nervous/anxious.    Blood pressure (!) 100/55, pulse 84, temperature 97.7 F (36.5 C), resp. rate 20, weight 64.7 kg, SpO2 100 %. Physical Exam Constitutional:      General: She is not in acute distress.    Appearance: She is well-developed. She is not diaphoretic.  HENT:     Head: Normocephalic and atraumatic.  Eyes:     General: No scleral icterus.       Right eye: No discharge.        Left eye: No discharge.  Conjunctiva/sclera: Conjunctivae normal.  Cardiovascular:     Rate and Rhythm: Normal rate and regular rhythm.  Pulmonary:     Effort: Pulmonary effort is normal. No respiratory distress.  Musculoskeletal:     Cervical back: Normal range of motion.     Comments: Left thigh: 2 incisions posterolateral thigh about 1-2cm long, no odor or active discharge though dressing saturated. No odor or purulence noted.  Skin:    General: Skin is warm and dry.  Neurological:      Mental Status: She is alert.  Psychiatric:        Mood and Affect: Mood normal.        Behavior: Behavior normal.     Assessment/Plan: Left thigh wound -- Would not do anything differently at this point. Will need to f/u with surgeons at [facility] at earliest opportunity.    Lisette Abu, PA-C Orthopedic Surgery [phone] 11/20/2021, 10:26 AM

## 2021-11-20 NOTE — Progress Notes (Signed)
Subjective: Currently on dialysis  on schedule No complaints.  Objective Vital signs in last 24 hours: Vitals:   11/20/21 0932 11/20/21 0954 11/20/21 1000 11/20/21 1030  BP: (!) 115/59 91/73 (!) 100/55 (!) 111/52  Pulse: 87 84 84 91  Resp: '13 19 20 19  '$ Temp:      TempSrc:      SpO2: 98% 100%    Weight:       Weight change: -2.85 kg  Physical Exam: General: Alert female NAD on HD Heart: RRR no MRG Lungs: CTA anteriorly, nonlabored breathing, on HD Abdomen: NABS, soft, NT , ND Extremities: No pedal edema Dialysis Access: LUE AVF patent on HD   OP HD =[facility] 3h 34mn 400/600  61kg  2/2 bath P2  Hep 4000  LUE AVF - mircera 200 mcg IV q2, last 10/16, due 10/30 - venofer '100mg'$  IV tiw thru 11/06 - doxercalciferol 7 ug tiw - sod thiosulfate 25 gm iv tiw   Assessment/Plan: Septic shock: Febrile - ?hip infection v. HCAP, was on Vanc/Cefepime/Flagyl -> finishing course of Zosyn. 2. ESRD: Required CRRT on admit due to AMS, high BUN, etc. Now back to iHD nd tolerating well. Still had her temp cath- removed 11/08.  Uses L AVF for dialysis. 3. Encephalopathy: D/t shock/?meds -has resolved , but some decreased hearing and foot drop 11/7- now spontaneously improving. No offending meds identified.  4. Myoclonus: D/t meds, severe (cefepime, gabapentin and narcotics) now resolved.  5. Hyperkalemia: On admit, resolved with dialysis. 6. HTN/volume: BP controlled, euvolemic on exam but volume up per weights, but bed weight, UF as tolerated on HD 7. Anemia of ESRD: Hgb 7.1 - continue max dose Aranesp 2055m weekly while here. 8. Secondary HPTH: CorrCa ok, Phos high  still 6.5.  Tells me she has trouble tolerating any binder secondary to gastroparesis- restarted Fosrenol her usual outpatient binder - 9. Nutrition: Alb low, continue supplements. 10. 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 [facility] op note described 1 cup liquid being removed. Per our imaging her, size appears the same which is unfortunate. re-cultured 11/2 - NGTD. 11. T2DM with severe gastroparesis: Ongoing issue, s/p esophageal myotomy 09/17/21. Has issues swallow/keeping pills down. 12. L foot pain; Xray negative, uric acid normal. VVS consulted, s/p LE angiography with improvement in pain   DaErnest HaberPA-C [facility] Beeper [phone]/10/2021,11:18 AM  LOS: 12 days   Labs: Basic Metabolic Panel: Recent Labs  Lab 11/18/21 0500 11/19/21 1755 11/20/21 0500  NA 134* 135 136  K 3.4* 3.3* 3.2*  CL 94* 94* 96*  CO2 '25 23 26  '$ GLUCOSE 107* 106* 137*  BUN 30* 18 25*  CREATININE 5.60* 4.07* 4.79*  CALCIUM 8.6* 8.9 8.7*  PHOS 7.7* 6.5* 6.9*   Liver Function Tests: Recent Labs  Lab 11/18/21 0500 11/19/21 1755 11/20/21 0500  ALBUMIN 1.8* 1.9* 1.7*   No results for input(s): "LIPASE", "AMYLASE" in the last 168 hours. No results for input(s): "AMMONIA" in the last 168 hours. CBC: Recent Labs  Lab 11/15/21 0547 11/16/21 0500 11/17/21 0821 11/20/21 0500  WBC 17.0* 17.3* 17.6* 21.3*  HGB 8.1* 8.2* 7.5* 7.1*  HCT 28.0* 28.4* 25.5* 24.6*  MCV 81.2 82.3 80.2 83.1  PLT 500* 512* 415* 419*   Cardiac Enzymes: No results for input(s): "CKTOTAL", "CKMB", "CKMBINDEX", "TROPONINI" in the last 168 hours. CBG: Recent Labs  Lab 11/19/21  0815 11/19/21 1209 11/19/21 1637 11/19/21 2128 11/20/21 0801  GLUCAP 104* 114* 108* 169* 141*    Studies/Results: CT HEAD WO CONTRAST (5MM)  Result Date: 11/19/2021 CLINICAL DATA:  51 year old female with weakness, acute neurologic deficit. EXAM: CT HEAD WITHOUT CONTRAST TECHNIQUE: Contiguous axial images were obtained from the base of the skull through the vertex without intravenous contrast. RADIATION DOSE REDUCTION: This exam was performed according to the departmental dose-optimization program which includes automated exposure  control, adjustment of the mA and/or kV according to patient size and/or use of iterative reconstruction technique. COMPARISON:  Brain MRI 01/13/2019.  Head CT 11/08/2021. FINDINGS: Brain: Cerebral volume remains normal for age. No midline shift, ventriculomegaly, mass effect, evidence of mass lesion, intracranial hemorrhage or evidence of cortically based acute infarction. Gray-white matter differentiation remains largely normal for age. Subtle chronic lacunar infarct of the posterior left corona radiata, and heterogeneity in the brainstem, better demonstrated on MRI. Vascular: Calcified atherosclerosis at the skull base. No suspicious intracranial vascular hyperdensity. Skull: No acute osseous abnormality identified. Sinuses/Orbits: Visualized paranasal sinuses and mastoids are stable and well aerated. Other: Calcified scalp vessel atherosclerosis. Leftward gaze. No other orbit or scalp soft tissue finding. IMPRESSION: 1. No acute intracranial abnormality and stable non contrast CT appearance of the brain since last month. Mild chronic small vessel disease better demonstrated by MRI. 2. Calcified scalp vessel atherosclerosis, frequently observed in end stage renal disease. Electronically Signed   By: Genevie Ann M.D.   On: 11/19/2021 08:13   Medications:  sodium chloride      (feeding supplement) PROSource Plus  30 mL Oral BID BM   aspirin  81 mg Oral Daily   Chlorhexidine Gluconate Cloth  6 each Topical Q0600   clopidogrel  75 mg Oral Q breakfast   darbepoetin (ARANESP) injection - NON-DIALYSIS  200 mcg Subcutaneous Q Mon-1800   doxercalciferol  7 mcg Intravenous Q M,W,F-HD   heparin  5,000 Units Subcutaneous Q8H   insulin aspart  0-5 Units Subcutaneous QHS   insulin aspart  0-6 Units Subcutaneous TID WC   lanthanum  1,000 mg Oral TID WC   lidocaine  1 patch Transdermal Q24H   metoprolol succinate  25 mg Oral Daily   montelukast  10 mg Oral QHS   pantoprazole (PROTONIX) IV  40 mg Intravenous QHS    sodium chloride flush  3 mL Intravenous Q12H   thiamine (VITAMIN B1) injection  100 mg Intravenous Daily

## 2021-11-20 NOTE — Discharge Instructions (Signed)
° °  [facility]  ° °Discharge Instructions ° °Lower Extremity Angiogram; Angioplasty/Stenting ° °Please refer to the following instructions for your post-procedure care. Your surgeon or physician assistant will discuss any changes with you. ° °Activity ° °Avoid lifting more than 8 pounds (1 gallons of milk) for 72 hours (3 days) after your procedure. You may walk as much as you can tolerate. It's OK to drive after 72 hours. ° °Bathing/Showering ° °You may shower the day after your procedure. If you have a bandage, you may remove it at 24- 48 hours. Clean your incision site with mild soap and water. Pat the area dry with a clean towel. ° °Diet ° °Resume your pre-procedure diet. There are no special food restrictions following this procedure. All patients with peripheral vascular disease should follow a low fat/low cholesterol diet. In order to heal from your surgery, it is CRITICAL to get adequate nutrition. Your body requires vitamins, minerals, and protein. Vegetables are the best source of vitamins and minerals. Vegetables also provide the perfect balance of protein. Processed food has little nutritional value, so try to avoid this. ° °Medications ° °Resume taking all of your medications unless your doctor tells you not to. If your incision is causing pain, you may take over-the-counter pain relievers such as acetaminophen (Tylenol) ° °Follow Up ° °Follow up will be arranged at the time of your procedure. You may have an office visit scheduled or may be scheduled for surgery. Ask your surgeon if you have any questions. ° °Please call us immediately for any of the following conditions: °•Severe or worsening pain your legs or feet at rest or with walking. °•Increased pain, redness, drainage at your groin puncture site. °•Fever of 101 degrees or higher. °•If you have any mild or slow bleeding from your puncture site: lie down, apply firm constant pressure over the area with a piece of  gauze or a clean wash cloth for 30 minutes- no peeking!, call 911 right away if you are still bleeding after 30 minutes, or if the bleeding is heavy and unmanageable. ° °Reduce your risk factors of vascular disease: ° °Stop smoking. If you would like help call [facility] at 1-800-QUIT-NOW (1-800-784-8669) or [place] at [phone]. °Manage your cholesterol °Maintain a desired weight °Control your diabetes °Keep your blood pressure down ° °If you have any questions, please call the office at [phone] ° °

## 2021-11-21 DIAGNOSIS — G9341 Metabolic encephalopathy: Secondary | ICD-10-CM | POA: Diagnosis not present

## 2021-11-21 DIAGNOSIS — N186 End stage renal disease: Secondary | ICD-10-CM | POA: Diagnosis not present

## 2021-11-21 DIAGNOSIS — L0390 Cellulitis, unspecified: Secondary | ICD-10-CM | POA: Diagnosis not present

## 2021-11-21 DIAGNOSIS — A419 Sepsis, unspecified organism: Secondary | ICD-10-CM | POA: Diagnosis not present

## 2021-11-21 LAB — RENAL FUNCTION PANEL
Albumin: 1.9 g/dL — ABNORMAL LOW (ref 3.5–5.0)
Anion gap: 12 (ref 5–15)
BUN: 14 mg/dL (ref 6–20)
CO2: 28 mmol/L (ref 22–32)
Calcium: 9.2 mg/dL (ref 8.9–10.3)
Chloride: 97 mmol/L — ABNORMAL LOW (ref 98–111)
Creatinine, Ser: 3.07 mg/dL — ABNORMAL HIGH (ref 0.44–1.00)
GFR, Estimated: 18 mL/min — ABNORMAL LOW (ref >60)
Glucose, Bld: 111 mg/dL — ABNORMAL HIGH (ref 70–99)
Phosphorus: 5 mg/dL — ABNORMAL HIGH (ref 2.5–4.6)
Potassium: 3.3 mmol/L — ABNORMAL LOW (ref 3.5–5.1)
Sodium: 137 mmol/L (ref 135–145)

## 2021-11-21 LAB — CBC
HCT: 25.7 % — ABNORMAL LOW (ref 36.0–46.0)
Hemoglobin: 7.3 g/dL — ABNORMAL LOW (ref 12.0–15.0)
MCH: 23.4 pg — ABNORMAL LOW (ref 26.0–34.0)
MCHC: 28.4 g/dL — ABNORMAL LOW (ref 30.0–36.0)
MCV: 82.4 fL (ref 80.0–100.0)
Platelets: 427 10*3/uL — ABNORMAL HIGH (ref 150–400)
RBC: 3.12 MIL/uL — ABNORMAL LOW (ref 3.87–5.11)
RDW: 20.8 % — ABNORMAL HIGH (ref 11.5–15.5)
WBC: 20.4 10*3/uL — ABNORMAL HIGH (ref 4.0–10.5)
nRBC: 0 % (ref 0.0–0.2)

## 2021-11-21 MED ORDER — THIAMINE MONONITRATE 100 MG PO TABS
100.0000 mg | ORAL_TABLET | Freq: Every day | ORAL | Status: DC
  Administered 2021-11-21 – 2021-11-22 (×2): 100 mg via ORAL
  Filled 2021-11-21 (×2): qty 1

## 2021-11-21 MED ORDER — PANTOPRAZOLE SODIUM 40 MG PO TBEC
40.0000 mg | DELAYED_RELEASE_TABLET | Freq: Every day | ORAL | Status: DC
  Administered 2021-11-21 – 2021-11-23 (×3): 40 mg via ORAL
  Filled 2021-11-21 (×3): qty 1

## 2021-11-21 NOTE — Progress Notes (Signed)
PROGRESS NOTE  Susan Horton (DOB 05-Dec-1970) BWG:[id number]   PCP: Charlott Rakes, MD   Brief Narrative:  51 year old woman, former smoker, with end-stage renal disease on HD, CAD, hypertension with systolic and diastolic CHF, diabetes complicated by peripheral neuropathy and gastroparesis, lower extremity DVT, asthma.  She underwent recent excision of tumoral calcinosis left hip/thigh, drain and sutures removed on 10/20/2021 without any evidence of infection per office notes.    She was brought to the emergency department 10/29 with 3 days of progressive fatigue, weakness.  She apparently suffered a mechanical fall on 10/27 with resultant R hip pain.  In the ED noted to be febrile, lethargic/confused, tachycardic.  Slowly improving in mentation. Stay complicated by left foot pain needing angiogram.  And now left foot drop and hearing impairment. Neuroimaging negative.   Assessment and Plan: Acute metabolic encephalopathy: Unclear exact precipitant but has resolved. ?if related to sepsis or medications or uremia.  - Monitor  Abrupt onset bilateral sensorineural hearing loss: Confirmed by audiology 11/7, though symptom has actually resolved. Visualization of canal essentially clear-- no bulging of TM. No acute abnormality on head CT. - Avoid ototoxins, follow up with audiology after discharge.  Left foot pain: Negative XR, Uric acid wnl.  - Continue lidoderm, continue oral analgesics as ordered.  PAD: s/p remote left EIA, SFA stent, known stenosis with no toe pressure bilaterally, noncompressible R ABI. In the setting of shock and pressor requirement, left SFA stent occlusion was suspected to be cause of left foot pain. Underwent left common iliac artery atherectomy and and stent, DCB of the left common femoral artery and stent of the left SFA in the setting of multilevel high-grade stenosis this admission on 11/6.  - Continue medications as ordered.   Left foot drop: Nonacute head CT.  -  AFO recommended    Septic shock: Shock resolved with antibiotics empirically. Initially concerned for possible bacteremia in patient with vascular access, on HD. Cultures done after abx are NGTD.  - CT Hip with some soft tissue edema which might reflect mild cellulitis. Wound draining but no exudate, culture negative. Wound continues to appear uninfected.  - Completed broad abx 10/29 - 11/6. Remains with leukocytosis though wound appears stable and she's afebrile, no nidus of infection noted by symptoms or exam. Pulled HD cath. WBC slightly stabilizing today, will confirm this is a reassuring trend prior to feeling fully reassured.  Check blood cultures if she develops a fever, though index of suspicion for infection remains low currently. Without the rising leukocytosis declaring its etiology, I believe it's prudent to delay CIR admission at this time.    Biventricular heart failure, severe TR (new), pulmonary HTN:  - Cardiology signed off, suspecting the PA pressure elevation led to worsening of previously moderate TR. No further work up currently recommended.   CAD s/p mLAD PCI w DES (2018), HTN, HLD: LVEF 45-50%, LV global hypokinesis. RV moderately reduced systolic fxn, moderately enlarged RV size, mildly elevated PASP. RVSP 43. - DAPT  - Volume management per RRT    ESRD:  - Continue HD per nephrology - Pt has consistently declined lanthanum because it causes emesis. Will reach out to renal to see if alternative is recommended.   Hyperkalemia: Resolved with HD.  Hyperphosphatemia   Uremia: Resolved with HD  Anemia of ESRD: Ferritin 5k, TIBC low, V77, folic acid wnl.  - ESA deferred to nephrology. Continues to have no bleeding or symptoms attributable to anemia. - Monitor and transfuse prn hgb <  7g/dl. Recheck at next HD.   Asthma without exacerbation  - Continue breo ellipta, singulair, prn BD.   T2DM with diabetic neuropathy/gastroparesis: HbA1c 5%, CBGs controlled without  insulin, will DC CBGs.    Tumoral calcinosis: Excision from left thigh performed at [facility] 9/29. Cx NGTD. - Continue wound care per orders.  - Will need to follow up with WF surgery (was planning to follow up 6 weeks postop which is 11/10).    Consultants:  PCCM Renal Vascular  Subjective: No new complaints. She's got severe pain in the left foot but fortunately it is adequately, and actually more durably, controlled on increased oxycodone dosing. No sedation noted. Denies fever, chills, new cough, dyspnea, abd pain, urinary symptoms (ESRD). After exhaustive ROS she states she feels fine.   Objective: BP (!) 145/75 (BP Location: Right Arm)   Pulse 76   Temp (!) 97.4 F (36.3 C) (Oral)   Resp 16   Wt 63.5 kg   SpO2 98%   BMI 23.30 kg/m   Gen: No distress, not diaphoretic or toxic Pulm: Clear, nonlabored  CV: RRR, no pitting dependent edema GI: Soft, NT, ND, +BS Neuro: Alert and oriented, stable left foot drop, hearing appears normal today. No new focal deficits. Ext: Warm, no deformities, no exudate on wound noted. Right IJ punctum site is c/d/I without tenderness or erythema. Skin: As above, otherwise no rashes, lesions or ulcers on visualized skin   Family Communication: None at bedside  Disposition Plan: CIR when leukocytosis improves.   Time spent: 45 minutes spent on chart review, discussion with nursing staff, consultants, updating family and interview/physical exam; more than 50% of that time was spent in counseling and/or coordination of care.    Patrecia Pour, MD [facility] Available via Epic secure chat 7am-7pm After these hours, please refer to coverage provider listed on [email or web address] 11/21/2021, 11:10 AM

## 2021-11-21 NOTE — Progress Notes (Signed)
Patient refusing lanthanum (FOSRENOL) chewable tablet 1,000 mg stating the medication causes emesis.  MD Bonner Puna notified during rounds.  MD Bonner Puna requested nephrology be notified to allow nephrology to review for medication alternatives.  Nephrology PA Zeyfang on unit rounding on patient and notified of medication concern.

## 2021-11-21 NOTE — Plan of Care (Signed)
  Problem: Education: Goal: Knowledge of General Education information will improve Description: Including pain rating scale, medication(s)/side effects and non-pharmacologic comfort measures Outcome: Progressing   Problem: Health Behavior/Discharge Planning: Goal: Ability to manage health-related needs will improve Outcome: Progressing   Problem: Clinical Measurements: Goal: Cardiovascular complication will be avoided Outcome: Progressing   Problem: Pain Managment: Goal: General experience of comfort will improve Outcome: Progressing   Problem: Safety: Goal: Ability to remain free from injury will improve Outcome: Progressing   Problem: Skin Integrity: Goal: Risk for impaired skin integrity will decrease Outcome: Progressing   

## 2021-11-21 NOTE — Progress Notes (Signed)
Subjective: Seen in room no current complaints said tolerated dialysis yesterday.  Refusing phosphate binder because of nausea vomiting. "They all make me vomit."  Objective Vital signs in last 24 hours: Vitals:   11/20/21 1713 11/20/21 2126 11/21/21 0545 11/21/21 0739  BP: (!) 149/62 (!) 135/56 (!) 151/66 (!) 145/75  Pulse: 93 80 80 76  Resp: '20 20 20 16  '$ Temp: 98.1 F (36.7 C) 97.8 F (36.6 C) 97.7 F (36.5 C) (!) 97.4 F (36.3 C)  TempSrc: Oral Oral Oral Oral  SpO2: 98% 97% 98% 98%  Weight:   63.5 kg    Weight change: 1.65 kg  Physical Exam: General: Alert l chronically ill-appearing female NAD  Heart: RRR no MRG Lungs: CTA anteriorly, nonlabored breathing,  Abdomen: NABS, soft, NT , ND Extremities: No pedal edema Dialysis Access: LUE AVF + bruit     OP HD =[facility] 3h 28mn 400/600  61kg  2/2 bath P2  Hep 4000  LUE AVF - mircera 200 mcg IV q2, last 10/16, due 10/30 - venofer '100mg'$  IV tiw thru 11/06 - doxercalciferol 7 ug tiw - sod thiosulfate 25 gm iv tiw   Assessment/Plan: Septic shock: Febrile - ?hip infection v. HCAP, was on Vanc/Cefepime/Flagyl -> finishing course of Zosyn. 2. ESRD: Required CRRT on admit due to AMS, high BUN, etc. Now back to iHD and tolerating well. Still had her temp cath- removed 11/08.  Uses L AVF for dialysis.  On MWF schedule 3. Encephalopathy: D/t shock/?meds -has resolved , but some decreased hearing and foot drop 11/7- now spontaneously improving. No offending meds identified.  4. Myoclonus: D/t meds, severe (cefepime, gabapentin and narcotics) now resolved.  5. Hyperkalemia: On admit, resolved with dialysis. 6. HTN/volume: BP controlled, euvolemic on exam but volume up per weights, but bed weight, UF as tolerated on HD 7. Anemia of ESRD: Hgb 7.3 <7.1- continue max dose Aranesp 2012m weekly while here. 8. Secondary HPTH: CorrCa ok, Phos today 5.0< 6.5.  Reports trouble tolerating any binder secondary to gastroparesis-we will hold binder  for now follow-up trend discussion about following diet direction- 9. Nutrition: Alb low, 1.9<1.7 continue supplements sometimes refusing Nepro 10. 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 [facility] op note described 1 cup liquid being removed. Per our imaging her, size appears the same which is unfortunate. re-cultured 11/2 - NGTD. 11. T2DM with severe gastroparesis: Ongoing issue, s/p esophageal myotomy 09/17/21. Has issues swallow/keeping pills down. 12. L foot pain; Xray negative, uric acid normal. VVS consulted, s/p LE angiography with improvement in pain  DaErnest HaberPA-C [facility] Beeper [phone]/11/2021,1:24 PM  LOS: 13 days   Labs: Basic Metabolic Panel: Recent Labs  Lab 11/19/21 1755 11/20/21 0500 11/21/21 0101  NA 135 136 137  K 3.3* 3.2* 3.3*  CL 94* 96* 97*  CO2 '23 26 28  '$ GLUCOSE 106* 137* 111*  BUN 18 25* 14  CREATININE 4.07* 4.79* 3.07*  CALCIUM 8.9 8.7* 9.2  PHOS 6.5* 6.9* 5.0*   Liver Function Tests: Recent Labs  Lab 11/19/21 1755 11/20/21 0500 11/21/21 0101  ALBUMIN 1.9* 1.7* 1.9*   No results for input(s): "LIPASE", "AMYLASE" in the last 168 hours. No results for input(s): "AMMONIA" in the last 168 hours. CBC: Recent Labs  Lab 11/15/21 0547 11/16/21 0500 11/17/21 0821 11/20/21 0500 11/21/21 0101  WBC 17.0* 17.3* 17.6* 21.3* 20.4*  HGB 8.1* 8.2*  7.5* 7.1* 7.3*  HCT 28.0* 28.4* 25.5* 24.6* 25.7*  MCV 81.2 82.3 80.2 83.1 82.4  PLT 500* 512* 415* 419* 427*   Cardiac Enzymes: No results for input(s): "CKTOTAL", "CKMB", "CKMBINDEX", "TROPONINI" in the last 168 hours. CBG: Recent Labs  Lab 11/19/21 2128 11/20/21 0801 11/20/21 1420 11/20/21 1720 11/20/21 2121  GLUCAP 169* 141* 93 141* 128*    Studies/Results: No results found. Medications:  sodium chloride      (feeding supplement) PROSource Plus  30  mL Oral BID BM   aspirin  81 mg Oral Daily   clopidogrel  75 mg Oral Q breakfast   darbepoetin (ARANESP) injection - NON-DIALYSIS  200 mcg Subcutaneous Q Mon-1800   doxercalciferol  7 mcg Intravenous Q M,W,F-HD   heparin  5,000 Units Subcutaneous Q8H   lanthanum  1,000 mg Oral TID WC   lidocaine  1 patch Transdermal Q24H   metoprolol succinate  25 mg Oral Daily   montelukast  10 mg Oral QHS   pantoprazole (PROTONIX) IV  40 mg Intravenous QHS   sodium chloride flush  3 mL Intravenous Q12H   thiamine  100 mg Oral Daily

## 2021-11-22 DIAGNOSIS — L0390 Cellulitis, unspecified: Secondary | ICD-10-CM | POA: Diagnosis not present

## 2021-11-22 DIAGNOSIS — A419 Sepsis, unspecified organism: Secondary | ICD-10-CM | POA: Diagnosis not present

## 2021-11-22 DIAGNOSIS — G9341 Metabolic encephalopathy: Secondary | ICD-10-CM | POA: Diagnosis not present

## 2021-11-22 DIAGNOSIS — N186 End stage renal disease: Secondary | ICD-10-CM | POA: Diagnosis not present

## 2021-11-22 LAB — RENAL FUNCTION PANEL
Albumin: 1.8 g/dL — ABNORMAL LOW (ref 3.5–5.0)
Anion gap: 18 — ABNORMAL HIGH (ref 5–15)
BUN: 29 mg/dL — ABNORMAL HIGH (ref 6–20)
CO2: 26 mmol/L (ref 22–32)
Calcium: 9.4 mg/dL (ref 8.9–10.3)
Chloride: 95 mmol/L — ABNORMAL LOW (ref 98–111)
Creatinine, Ser: 4.35 mg/dL — ABNORMAL HIGH (ref 0.44–1.00)
GFR, Estimated: 12 mL/min — ABNORMAL LOW (ref >60)
Glucose, Bld: 169 mg/dL — ABNORMAL HIGH (ref 70–99)
Phosphorus: 6.1 mg/dL — ABNORMAL HIGH (ref 2.5–4.6)
Potassium: 3.4 mmol/L — ABNORMAL LOW (ref 3.5–5.1)
Sodium: 139 mmol/L (ref 135–145)

## 2021-11-22 LAB — CBC
HCT: 24.9 % — ABNORMAL LOW (ref 36.0–46.0)
Hemoglobin: 7.3 g/dL — ABNORMAL LOW (ref 12.0–15.0)
MCH: 23.7 pg — ABNORMAL LOW (ref 26.0–34.0)
MCHC: 29.3 g/dL — ABNORMAL LOW (ref 30.0–36.0)
MCV: 80.8 fL (ref 80.0–100.0)
Platelets: 490 10*3/uL — ABNORMAL HIGH (ref 150–400)
RBC: 3.08 MIL/uL — ABNORMAL LOW (ref 3.87–5.11)
RDW: 20.7 % — ABNORMAL HIGH (ref 11.5–15.5)
WBC: 20.2 10*3/uL — ABNORMAL HIGH (ref 4.0–10.5)
nRBC: 0 % (ref 0.0–0.2)

## 2021-11-22 MED ORDER — METOCLOPRAMIDE HCL 5 MG PO TABS
10.0000 mg | ORAL_TABLET | Freq: Three times a day (TID) | ORAL | Status: DC
  Administered 2021-11-22 – 2021-11-23 (×3): 10 mg via ORAL
  Filled 2021-11-22 (×3): qty 2

## 2021-11-22 MED ORDER — ONDANSETRON 4 MG PO TBDP
4.0000 mg | ORAL_TABLET | Freq: Three times a day (TID) | ORAL | Status: DC | PRN
  Administered 2021-11-22: 4 mg via ORAL
  Filled 2021-11-22 (×2): qty 1

## 2021-11-22 NOTE — Progress Notes (Signed)
PROGRESS NOTE  Susan Horton (DOB 1970-01-27) GEZ:[id number]   PCP: Charlott Rakes, MD   Brief Narrative:  51 year old woman, former smoker, with end-stage renal disease on HD, CAD, hypertension with systolic and diastolic CHF, diabetes complicated by peripheral neuropathy and gastroparesis, lower extremity DVT, asthma.  She underwent recent excision of tumoral calcinosis left hip/thigh, drain and sutures removed on 10/20/2021 without any evidence of infection per office notes.    She was brought to the emergency department 10/29 with 3 days of progressive fatigue, weakness.  She apparently suffered a mechanical fall on 10/27 with resultant R hip pain.  In the ED noted to be febrile, lethargic/confused, tachycardic.  Slowly improving in mentation. Stay complicated by left foot pain needing angiogram.  And now left foot drop and hearing impairment. Neuroimaging negative.   Assessment and Plan: Acute metabolic encephalopathy: Unclear exact precipitant but has resolved. ?if related to sepsis or medications or uremia.  - Monitor  Abrupt onset bilateral sensorineural hearing loss: Confirmed by audiology 11/7, though symptom has actually resolved. Visualization of canal essentially clear-- no bulging of TM. No acute abnormality on head CT. - Avoid ototoxins, follow up with audiology after discharge.  Left foot pain: Negative XR, Uric acid wnl.  - Continue lidoderm, continue oral analgesics as ordered.  PAD: s/p remote left EIA, SFA stent, known stenosis with no toe pressure bilaterally, noncompressible R ABI. In the setting of shock and pressor requirement, left SFA stent occlusion was suspected to be cause of left foot pain. Underwent left common iliac artery atherectomy and and stent, DCB of the left common femoral artery and stent of the left SFA in the setting of multilevel high-grade stenosis this admission on 11/6.  - Continue medications as ordered.   Left foot drop: Nonacute head CT.  -  AFO recommended    Septic shock: Shock resolved with antibiotics empirically. Initially concerned for possible bacteremia in patient with vascular access, on HD. Cultures done after abx are NGTD.  - CT Hip with some soft tissue edema which might reflect mild cellulitis. Wound draining but no exudate, culture negative. Wound continues to appear uninfected.  - Completed broad abx 10/29 - 11/6. WBC remains 20k in the absence of fever or nidus of infection. She doesn't have signs or symptoms of VTE. WBC was 12.8 on admission, last values before that were all normal, but date most recently to June 2022. May consult hematology if no improvement 11/13. Check blood cultures if she develops a fever, though index of suspicion for infection remains low currently. Without the rising leukocytosis declaring its etiology, I believe it's prudent to delay CIR admission at this time.    Biventricular heart failure, severe TR (new), pulmonary HTN:  - Cardiology signed off, suspecting the PA pressure elevation led to worsening of previously moderate TR. No further work up currently recommended.   CAD s/p mLAD PCI w DES (2018), HTN, HLD: LVEF 45-50%, LV global hypokinesis. RV moderately reduced systolic fxn, moderately enlarged RV size, mildly elevated PASP. RVSP 43. - DAPT  - Volume management per RRT    ESRD:  - Continue HD per nephrology - Pt has consistently declined lanthanum because it causes emesis. Will reach out to renal to see if alternative is recommended.   Hyperkalemia: Resolved with HD.  Hyperphosphatemia   Uremia: Resolved with HD  Anemia of ESRD: Ferritin 5k, TIBC low, Y50, folic acid wnl.  - ESA deferred to nephrology. Continues to have no bleeding or symptoms attributable to anemia. -  Monitor and transfuse prn hgb < 7g/dl. Recheck at next HD.   Asthma without exacerbation  - Continue breo ellipta, singulair, prn BD.   T2DM with diabetic neuropathy/gastroparesis: HbA1c 5%, CBGs controlled  without insulin, will DC CBGs.  - Has previously been on reglan, not on her current home med list (though is on outpatient dialysis center note 10/25), will restart as her regurgitation symptoms are worsening.    Tumoral calcinosis: Excision from left thigh performed at [facility] 9/29. Cx NGTD. - Continue wound care per orders.  - Will need to follow up with [facility] surgery (was planning to follow up 6 weeks postop which is 11/10).    Consultants:  PCCM Renal Vascular  Subjective: Denies fevers, chills, or feeling unwell. No abdominal pain, doesn't make urine. No cough or dyspnea or chest pain. No changes in left foot pain. No pain when she eats or exerts herself.   Objective: BP (!) 118/58   Pulse 86   Temp 98.9 F (37.2 C) (Oral)   Resp 18   Wt 63.7 kg   SpO2 98%   BMI 23.37 kg/m   Gen: Chronically ill-appearing, nontoxic female in no distress Pulm: Clear, nonlabored  CV: RRR, no MRG, no pitting edema or JVD GI: Soft, NT, ND, +BS. No tenderness at all.  Neuro: Alert and oriented. Left foot drop, no other new focal deficits. Ext: Warm, dry Skin: No erythema or induration to the left foot or left thigh wound. No exudate. Right IJ site is c/d/I, nontender without erythema or discharge. No other rashes, lesions or ulcers on visualized skin   Family Communication: None at bedside  Disposition Plan: CIR when leukocytosis improves and bed is available.  Time spent: 45 minutes spent on chart review, discussion with nursing staff, consultants, updating family and interview/physical exam; more than 50% of that time was spent in counseling and/or coordination of care.    Patrecia Pour, MD [facility] Available via Epic secure chat 7am-7pm After these hours, please refer to coverage provider listed on [email or web address] 11/22/2021, 2:17 PM

## 2021-11-22 NOTE — Plan of Care (Signed)
  Problem: Health Behavior/Discharge Planning: Goal: Ability to identify and utilize available resources and services will improve Outcome: Progressing   Problem: Health Behavior/Discharge Planning: Goal: Ability to manage health-related needs will improve Outcome: Progressing   Problem: Clinical Measurements: Goal: Cardiovascular complication will be avoided Outcome: Progressing   Problem: Activity: Goal: Risk for activity intolerance will decrease Outcome: Progressing   Problem: Pain Managment: Goal: General experience of comfort will improve Outcome: Progressing   Problem: Safety: Goal: Ability to remain free from injury will improve Outcome: Progressing   Problem: Skin Integrity: Goal: Risk for impaired skin integrity will decrease Outcome: Progressing

## 2021-11-22 NOTE — Progress Notes (Signed)
Subjective: Awoken from sleep no complaints, dialysis tomorrow on schedule  Objective Vital signs in last 24 hours: Vitals:   11/21/21 0739 11/21/21 2053 11/22/21 0629 11/22/21 0905  BP: (!) 145/75 (!) 136/54 (!) 133/55 (!) 118/58  Pulse: 76 83 86 86  Resp: '16 19 18   '$ Temp: (!) 97.4 F (36.3 C) 98.1 F (36.7 C) 98.9 F (37.2 C)   TempSrc: Oral Oral Oral   SpO2: 98% 98%    Weight:   63.7 kg    Weight change: -1 kg  Physical Exam: General: Alert l chronically ill-appearing female NAD  Heart: RRR no MRG Lungs: CTA anteriorly, nonlabored breathing,  Abdomen: NABS, soft, NT , ND Extremities: No pedal edema Dialysis Access: LUE AVF + bruit     OP HD =[facility] 3h 97mn 400/600  61kg  2/2 bath P2  Hep 4000  LUE AVF - mircera 200 mcg IV q2, last 10/16, due 10/30 - venofer '100mg'$  IV tiw thru 11/06 - doxercalciferol 7 ug tiw - sod thiosulfate 25 gm iv tiw   Assessment/Plan: Septic shock: Febrile - ?hip infection v. HCAP, was on Vanc/Cefepime/Flagyl -> finished course of Zosyn. 2. ESRD: Required CRRT on admit due to AMS, high BUN, etc. Now back to iHD and tolerating well. Still had her temp cath- removed 11/08.  Uses L AVF for dialysis.  On MWF schedule 3. Encephalopathy: D/t shock/?meds -has resolved , but some decreased hearing and foot drop 11/7- now spontaneously improving. No offending meds identified.  4. Myoclonus: D/t meds, severe (cefepime, gabapentin and narcotics) now resolved.  5. Hyperkalemia: On admit, resolved with dialysis. 6. HTN/volume: BP controlled, euvolemic on exam but volume up per weights, but bed weight, UF as tolerated on HD 7. Anemia of ESRD: Hgb 7.3< 7.3 <7.1- continue max dose Aranesp 2039m weekly while here. 8. Secondary HPTH: CorrCa ok, Phos today 6.1< 5.0< 6.5.  Reports trouble tolerating any binder secondary to gastroparesis-we will hold binder for now follow-up trend discussion about following diet direction-was on Fosrenol, no iron binders with  ferritin 5000 9. Nutrition: Alb low, 1.8< 1.9<1.7 continue supplements sometimes refusing Nepro 10. 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 [facility] - op note described 1 cup liquid being removed. Per our imaging her, size appears the same which is unfortunate. re-cultured 11/2 - NGTD. 11. T2DM with severe gastroparesis: Ongoing issue, s/p esophageal myotomy 09/17/21. Has issues swallow/keeping pills down. 12. L foot pain; Xray negative, uric acid normal. VVS consulted, s/p LE angiography with improvement in pain  DaErnest HaberPA-C [facility] Beeper [phone]/12/2021,12:24 PM  LOS: 14 days   Labs: Basic Metabolic Panel: Recent Labs  Lab 11/20/21 0500 11/21/21 0101 11/22/21 0111  NA 136 137 139  K 3.2* 3.3* 3.4*  CL 96* 97* 95*  CO2 '26 28 26  '$ GLUCOSE 137* 111* 169*  BUN 25* 14 29*  CREATININE 4.79* 3.07* 4.35*  CALCIUM 8.7* 9.2 9.4  PHOS 6.9* 5.0* 6.1*   Liver Function Tests: Recent Labs  Lab 11/20/21 0500 11/21/21 0101 11/22/21 0111  ALBUMIN 1.7* 1.9* 1.8*   No results for input(s): "LIPASE", "AMYLASE" in the last 168 hours. No results for input(s): "AMMONIA" in the last 168 hours. CBC: Recent Labs  Lab 11/16/21 0500 11/17/21 0821 11/20/21 0500 11/21/21 0101 11/22/21 0111  WBC 17.3* 17.6* 21.3* 20.4* 20.2*  HGB 8.2* 7.5* 7.1* 7.3* 7.3*  HCT 28.4*  25.5* 24.6* 25.7* 24.9*  MCV 82.3 80.2 83.1 82.4 80.8  PLT 512* 415* 419* 427* 490*   Cardiac Enzymes: No results for input(s): "CKTOTAL", "CKMB", "CKMBINDEX", "TROPONINI" in the last 168 hours. CBG: Recent Labs  Lab 11/19/21 2128 11/20/21 0801 11/20/21 1420 11/20/21 1720 11/20/21 2121  GLUCAP 169* 141* 93 141* 128*    Studies/Results: No results found. Medications:  sodium chloride      aspirin  81 mg Oral Daily   clopidogrel  75 mg Oral Q breakfast   darbepoetin  (ARANESP) injection - NON-DIALYSIS  200 mcg Subcutaneous Q Mon-1800   doxercalciferol  7 mcg Intravenous Q M,W,F-HD   heparin  5,000 Units Subcutaneous Q8H   lidocaine  1 patch Transdermal Q24H   metoprolol succinate  25 mg Oral Daily   montelukast  10 mg Oral QHS   pantoprazole  40 mg Oral Daily   sodium chloride flush  3 mL Intravenous Q12H   thiamine  100 mg Oral Daily

## 2021-11-22 NOTE — Progress Notes (Signed)
Patient refused IV line to be inserted. Plan of care ongoing.

## 2021-11-22 NOTE — Progress Notes (Signed)
Mobility Specialist - Progress Note   11/22/21 1414  Mobility  Activity Turned to right side;Turned to left side;Turned to back - supine  Level of Assistance Minimal assist, patient does 75% or more  Assistive Device None  Activity Response Tolerated poorly  Mobility Referral Yes  $Mobility charge 1 Mobility   Pt received in bed and requesting assistant getting off bedpan. Pt was MaxA for pericare and MinA for turning in bed. Pt denied OOB mobility d/t nausea and diarrhea. Upon pt head being let down to be slide up in bed, pt got very nauseas and stared to throw up. Pt was cleaned up and left in bed with al needs met and RN notified.   Franki Monte  Mobility Specialist Please contact via Solicitor or Rehab office at [phone]

## 2021-11-23 ENCOUNTER — Inpatient Hospital Stay (HOSPITAL_COMMUNITY)
Admission: RE | Admit: 2021-11-23 | Discharge: 2021-12-15 | DRG: 945 | Disposition: A | Discharge status: Other Acute Inpatient Hospital | Payer: Medicare Other | Source: Intra-hospital | Attending: Physical Medicine and Rehabilitation | Admitting: Physical Medicine and Rehabilitation

## 2021-11-23 DIAGNOSIS — I7090 Unspecified atherosclerosis: Secondary | ICD-10-CM | POA: Diagnosis not present

## 2021-11-23 DIAGNOSIS — I998 Other disorder of circulatory system: Secondary | ICD-10-CM | POA: Diagnosis not present

## 2021-11-23 DIAGNOSIS — N179 Acute kidney failure, unspecified: Secondary | ICD-10-CM | POA: Diagnosis not present

## 2021-11-23 DIAGNOSIS — E1152 Type 2 diabetes mellitus with diabetic peripheral angiopathy with gangrene: Secondary | ICD-10-CM | POA: Diagnosis present

## 2021-11-23 DIAGNOSIS — N186 End stage renal disease: Secondary | ICD-10-CM

## 2021-11-23 DIAGNOSIS — K3184 Gastroparesis: Secondary | ICD-10-CM | POA: Diagnosis present

## 2021-11-23 DIAGNOSIS — R5381 Other malaise: Secondary | ICD-10-CM | POA: Diagnosis present

## 2021-11-23 DIAGNOSIS — Z7982 Long term (current) use of aspirin: Secondary | ICD-10-CM

## 2021-11-23 DIAGNOSIS — G253 Myoclonus: Secondary | ICD-10-CM | POA: Diagnosis present

## 2021-11-23 DIAGNOSIS — G9341 Metabolic encephalopathy: Secondary | ICD-10-CM | POA: Diagnosis present

## 2021-11-23 DIAGNOSIS — D72823 Leukemoid reaction: Secondary | ICD-10-CM | POA: Diagnosis not present

## 2021-11-23 DIAGNOSIS — G4700 Insomnia, unspecified: Secondary | ICD-10-CM | POA: Diagnosis not present

## 2021-11-23 DIAGNOSIS — Z992 Dependence on renal dialysis: Secondary | ICD-10-CM

## 2021-11-23 DIAGNOSIS — M79605 Pain in left leg: Secondary | ICD-10-CM | POA: Diagnosis not present

## 2021-11-23 DIAGNOSIS — R197 Diarrhea, unspecified: Secondary | ICD-10-CM | POA: Diagnosis not present

## 2021-11-23 DIAGNOSIS — I2510 Atherosclerotic heart disease of native coronary artery without angina pectoris: Secondary | ICD-10-CM | POA: Diagnosis present

## 2021-11-23 DIAGNOSIS — E1122 Type 2 diabetes mellitus with diabetic chronic kidney disease: Secondary | ICD-10-CM | POA: Diagnosis present

## 2021-11-23 DIAGNOSIS — M4628 Osteomyelitis of vertebra, sacral and sacrococcygeal region: Secondary | ICD-10-CM

## 2021-11-23 DIAGNOSIS — I70262 Atherosclerosis of native arteries of extremities with gangrene, left leg: Secondary | ICD-10-CM | POA: Diagnosis present

## 2021-11-23 DIAGNOSIS — Z8616 Personal history of COVID-19: Secondary | ICD-10-CM

## 2021-11-23 DIAGNOSIS — L89152 Pressure ulcer of sacral region, stage 2: Secondary | ICD-10-CM | POA: Insufficient documentation

## 2021-11-23 DIAGNOSIS — I10 Essential (primary) hypertension: Secondary | ICD-10-CM | POA: Diagnosis not present

## 2021-11-23 DIAGNOSIS — E1142 Type 2 diabetes mellitus with diabetic polyneuropathy: Secondary | ICD-10-CM | POA: Diagnosis present

## 2021-11-23 DIAGNOSIS — Z7951 Long term (current) use of inhaled steroids: Secondary | ICD-10-CM

## 2021-11-23 DIAGNOSIS — D689 Coagulation defect, unspecified: Secondary | ICD-10-CM

## 2021-11-23 DIAGNOSIS — Z86718 Personal history of other venous thrombosis and embolism: Secondary | ICD-10-CM

## 2021-11-23 DIAGNOSIS — A419 Sepsis, unspecified organism: Secondary | ICD-10-CM | POA: Diagnosis present

## 2021-11-23 DIAGNOSIS — N2581 Secondary hyperparathyroidism of renal origin: Secondary | ICD-10-CM

## 2021-11-23 DIAGNOSIS — Z8249 Family history of ischemic heart disease and other diseases of the circulatory system: Secondary | ICD-10-CM

## 2021-11-23 DIAGNOSIS — R4182 Altered mental status, unspecified: Secondary | ICD-10-CM | POA: Diagnosis not present

## 2021-11-23 DIAGNOSIS — K5900 Constipation, unspecified: Secondary | ICD-10-CM | POA: Diagnosis present

## 2021-11-23 DIAGNOSIS — E875 Hyperkalemia: Secondary | ICD-10-CM | POA: Diagnosis present

## 2021-11-23 DIAGNOSIS — K5903 Drug induced constipation: Secondary | ICD-10-CM | POA: Diagnosis not present

## 2021-11-23 DIAGNOSIS — Z79899 Other long term (current) drug therapy: Secondary | ICD-10-CM | POA: Diagnosis not present

## 2021-11-23 DIAGNOSIS — T402X5A Adverse effect of other opioids, initial encounter: Secondary | ICD-10-CM | POA: Diagnosis not present

## 2021-11-23 DIAGNOSIS — Z87891 Personal history of nicotine dependence: Secondary | ICD-10-CM

## 2021-11-23 DIAGNOSIS — I739 Peripheral vascular disease, unspecified: Secondary | ICD-10-CM | POA: Diagnosis not present

## 2021-11-23 DIAGNOSIS — E440 Moderate protein-calorie malnutrition: Secondary | ICD-10-CM

## 2021-11-23 DIAGNOSIS — I70222 Atherosclerosis of native arteries of extremities with rest pain, left leg: Secondary | ICD-10-CM | POA: Diagnosis not present

## 2021-11-23 DIAGNOSIS — R112 Nausea with vomiting, unspecified: Secondary | ICD-10-CM | POA: Diagnosis not present

## 2021-11-23 DIAGNOSIS — M609 Myositis, unspecified: Secondary | ICD-10-CM | POA: Diagnosis not present

## 2021-11-23 DIAGNOSIS — J9601 Acute respiratory failure with hypoxia: Secondary | ICD-10-CM | POA: Diagnosis not present

## 2021-11-23 DIAGNOSIS — E785 Hyperlipidemia, unspecified: Secondary | ICD-10-CM | POA: Diagnosis present

## 2021-11-23 DIAGNOSIS — E1143 Type 2 diabetes mellitus with diabetic autonomic (poly)neuropathy: Secondary | ICD-10-CM | POA: Diagnosis present

## 2021-11-23 DIAGNOSIS — I132 Hypertensive heart and chronic kidney disease with heart failure and with stage 5 chronic kidney disease, or end stage renal disease: Secondary | ICD-10-CM | POA: Diagnosis present

## 2021-11-23 DIAGNOSIS — J4540 Moderate persistent asthma, uncomplicated: Secondary | ICD-10-CM | POA: Diagnosis present

## 2021-11-23 DIAGNOSIS — Z825 Family history of asthma and other chronic lower respiratory diseases: Secondary | ICD-10-CM

## 2021-11-23 DIAGNOSIS — M79604 Pain in right leg: Secondary | ICD-10-CM | POA: Diagnosis not present

## 2021-11-23 DIAGNOSIS — G9340 Encephalopathy, unspecified: Secondary | ICD-10-CM | POA: Diagnosis not present

## 2021-11-23 DIAGNOSIS — R6520 Severe sepsis without septic shock: Secondary | ICD-10-CM | POA: Diagnosis not present

## 2021-11-23 DIAGNOSIS — D649 Anemia, unspecified: Secondary | ICD-10-CM

## 2021-11-23 DIAGNOSIS — I5042 Chronic combined systolic (congestive) and diastolic (congestive) heart failure: Secondary | ICD-10-CM | POA: Diagnosis present

## 2021-11-23 DIAGNOSIS — N19 Unspecified kidney failure: Secondary | ICD-10-CM | POA: Diagnosis not present

## 2021-11-23 DIAGNOSIS — J45909 Unspecified asthma, uncomplicated: Secondary | ICD-10-CM | POA: Diagnosis present

## 2021-11-23 DIAGNOSIS — R52 Pain, unspecified: Secondary | ICD-10-CM | POA: Diagnosis not present

## 2021-11-23 DIAGNOSIS — R579 Shock, unspecified: Secondary | ICD-10-CM | POA: Diagnosis not present

## 2021-11-23 DIAGNOSIS — H903 Sensorineural hearing loss, bilateral: Secondary | ICD-10-CM | POA: Diagnosis present

## 2021-11-23 DIAGNOSIS — I120 Hypertensive chronic kidney disease with stage 5 chronic kidney disease or end stage renal disease: Secondary | ICD-10-CM | POA: Diagnosis not present

## 2021-11-23 DIAGNOSIS — R6521 Severe sepsis with septic shock: Secondary | ICD-10-CM | POA: Diagnosis not present

## 2021-11-23 DIAGNOSIS — R300 Dysuria: Secondary | ICD-10-CM | POA: Diagnosis not present

## 2021-11-23 DIAGNOSIS — Z803 Family history of malignant neoplasm of breast: Secondary | ICD-10-CM

## 2021-11-23 DIAGNOSIS — I480 Paroxysmal atrial fibrillation: Secondary | ICD-10-CM | POA: Diagnosis not present

## 2021-11-23 DIAGNOSIS — K219 Gastro-esophageal reflux disease without esophagitis: Secondary | ICD-10-CM | POA: Diagnosis present

## 2021-11-23 DIAGNOSIS — R601 Generalized edema: Secondary | ICD-10-CM | POA: Diagnosis not present

## 2021-11-23 DIAGNOSIS — D631 Anemia in chronic kidney disease: Secondary | ICD-10-CM | POA: Diagnosis present

## 2021-11-23 DIAGNOSIS — M25552 Pain in left hip: Secondary | ICD-10-CM | POA: Diagnosis not present

## 2021-11-23 DIAGNOSIS — I96 Gangrene, not elsewhere classified: Secondary | ICD-10-CM | POA: Diagnosis not present

## 2021-11-23 DIAGNOSIS — L299 Pruritus, unspecified: Secondary | ICD-10-CM | POA: Diagnosis present

## 2021-11-23 DIAGNOSIS — L0390 Cellulitis, unspecified: Secondary | ICD-10-CM | POA: Diagnosis not present

## 2021-11-23 DIAGNOSIS — M19072 Primary osteoarthritis, left ankle and foot: Secondary | ICD-10-CM | POA: Diagnosis not present

## 2021-11-23 DIAGNOSIS — D72829 Elevated white blood cell count, unspecified: Secondary | ICD-10-CM | POA: Diagnosis not present

## 2021-11-23 DIAGNOSIS — Z955 Presence of coronary angioplasty implant and graft: Secondary | ICD-10-CM

## 2021-11-23 LAB — CBC WITH DIFFERENTIAL/PLATELET
Abs Immature Granulocytes: 0.18 10*3/uL — ABNORMAL HIGH (ref 0.00–0.07)
Basophils Absolute: 0.1 10*3/uL (ref 0.0–0.1)
Basophils Relative: 0 %
Eosinophils Absolute: 0.1 10*3/uL (ref 0.0–0.5)
Eosinophils Relative: 1 %
HCT: 27.7 % — ABNORMAL LOW (ref 36.0–46.0)
Hemoglobin: 7.9 g/dL — ABNORMAL LOW (ref 12.0–15.0)
Immature Granulocytes: 1 %
Lymphocytes Relative: 7 %
Lymphs Abs: 1.4 10*3/uL (ref 0.7–4.0)
MCH: 23.7 pg — ABNORMAL LOW (ref 26.0–34.0)
MCHC: 28.5 g/dL — ABNORMAL LOW (ref 30.0–36.0)
MCV: 82.9 fL (ref 80.0–100.0)
Monocytes Absolute: 1.3 10*3/uL — ABNORMAL HIGH (ref 0.1–1.0)
Monocytes Relative: 6 %
Neutro Abs: 17.1 10*3/uL — ABNORMAL HIGH (ref 1.7–7.7)
Neutrophils Relative %: 85 %
Platelets: 525 10*3/uL — ABNORMAL HIGH (ref 150–400)
RBC: 3.34 MIL/uL — ABNORMAL LOW (ref 3.87–5.11)
RDW: 20.9 % — ABNORMAL HIGH (ref 11.5–15.5)
WBC: 20.2 10*3/uL — ABNORMAL HIGH (ref 4.0–10.5)
nRBC: 0 % (ref 0.0–0.2)

## 2021-11-23 LAB — RENAL FUNCTION PANEL
Albumin: 1.9 g/dL — ABNORMAL LOW (ref 3.5–5.0)
Anion gap: 15 (ref 5–15)
BUN: 39 mg/dL — ABNORMAL HIGH (ref 6–20)
CO2: 26 mmol/L (ref 22–32)
Calcium: 9.2 mg/dL (ref 8.9–10.3)
Chloride: 96 mmol/L — ABNORMAL LOW (ref 98–111)
Creatinine, Ser: 5.72 mg/dL — ABNORMAL HIGH (ref 0.44–1.00)
GFR, Estimated: 8 mL/min — ABNORMAL LOW (ref >60)
Glucose, Bld: 110 mg/dL — ABNORMAL HIGH (ref 70–99)
Phosphorus: 6.8 mg/dL — ABNORMAL HIGH (ref 2.5–4.6)
Potassium: 3.7 mmol/L (ref 3.5–5.1)
Sodium: 137 mmol/L (ref 135–145)

## 2021-11-23 MED ORDER — MONTELUKAST SODIUM 10 MG PO TABS
10.0000 mg | ORAL_TABLET | Freq: Every day | ORAL | Status: DC
  Administered 2021-12-08 – 2021-12-14 (×6): 10 mg via ORAL
  Filled 2021-11-23 (×18): qty 1

## 2021-11-23 MED ORDER — HEPARIN SODIUM (PORCINE) 5000 UNIT/ML IJ SOLN: 5000.0000 [IU] | Freq: Three times a day (TID) | INTRAMUSCULAR | Status: DC

## 2021-11-23 MED ORDER — METOCLOPRAMIDE HCL 10 MG PO TABS: 10.0000 mg | ORAL_TABLET | Freq: Three times a day (TID) | ORAL | Status: AC

## 2021-11-23 MED ORDER — OXYCODONE HCL 5 MG PO TABS
10.0000 mg | ORAL_TABLET | ORAL | Status: DC | PRN
  Administered 2021-11-23 – 2021-12-09 (×42): 15 mg via ORAL
  Filled 2021-11-23 (×42): qty 3

## 2021-11-23 MED ORDER — LIDOCAINE 5 % EX PTCH
1.0000 | MEDICATED_PATCH | CUTANEOUS | Status: DC
  Administered 2021-11-24: 1 via TRANSDERMAL
  Filled 2021-11-23 (×7): qty 1

## 2021-11-23 MED ORDER — LIDOCAINE-PRILOCAINE 2.5-2.5 % EX CREA: 1.0000 | TOPICAL_CREAM | CUTANEOUS | Status: DC | PRN

## 2021-11-23 MED ORDER — LOPERAMIDE HCL 2 MG PO CAPS
2.0000 mg | ORAL_CAPSULE | ORAL | Status: DC | PRN
  Administered 2021-11-24 – 2021-11-30 (×12): 2 mg via ORAL
  Filled 2021-11-23 (×12): qty 1

## 2021-11-23 MED ORDER — HEPARIN SODIUM (PORCINE) 5000 UNIT/ML IJ SOLN
5000.0000 [IU] | Freq: Three times a day (TID) | INTRAMUSCULAR | Status: DC
  Administered 2021-11-24 – 2021-12-14 (×50): 5000 [IU] via SUBCUTANEOUS
  Filled 2021-11-23 (×55): qty 1

## 2021-11-23 MED ORDER — PANTOPRAZOLE SODIUM 40 MG PO TBEC
40.0000 mg | DELAYED_RELEASE_TABLET | Freq: Every day | ORAL | Status: DC
  Administered 2021-11-24 – 2021-12-02 (×9): 40 mg via ORAL
  Filled 2021-11-23 (×9): qty 1

## 2021-11-23 MED ORDER — IPRATROPIUM-ALBUTEROL 0.5-2.5 (3) MG/3ML IN SOLN: 3.0000 mL | Freq: Four times a day (QID) | RESPIRATORY_TRACT | Status: DC | PRN

## 2021-11-23 MED ORDER — POLYETHYLENE GLYCOL 3350 17 G PO PACK: 17.0000 g | PACK | Freq: Every day | ORAL | Status: DC | PRN

## 2021-11-23 MED ORDER — DARBEPOETIN ALFA 200 MCG/0.4ML IJ SOSY: 200.0000 ug | PREFILLED_SYRINGE | INTRAMUSCULAR | Status: DC

## 2021-11-23 MED ORDER — FLUTICASONE FUROATE-VILANTEROL 200-25 MCG/ACT IN AEPB: 1.0000 | INHALATION_SPRAY | Freq: Every day | RESPIRATORY_TRACT | Status: DC | PRN

## 2021-11-23 MED ORDER — ONDANSETRON 4 MG PO TBDP
4.0000 mg | ORAL_TABLET | Freq: Three times a day (TID) | ORAL | Status: DC | PRN
  Administered 2021-11-24 – 2021-12-08 (×9): 4 mg via ORAL
  Filled 2021-11-23 (×18): qty 1

## 2021-11-23 MED ORDER — LIDOCAINE HCL (PF) 1 % IJ SOLN: 5.0000 mL | INTRAMUSCULAR | Status: DC | PRN

## 2021-11-23 MED ORDER — CLOPIDOGREL BISULFATE 75 MG PO TABS
75.0000 mg | ORAL_TABLET | Freq: Every day | ORAL | Status: DC
  Administered 2021-11-24 – 2021-12-14 (×21): 75 mg via ORAL
  Filled 2021-11-23 (×22): qty 1

## 2021-11-23 MED ORDER — ACETAMINOPHEN 325 MG PO TABS: 650.0000 mg | ORAL_TABLET | Freq: Four times a day (QID) | ORAL | Status: DC | PRN

## 2021-11-23 MED ORDER — HEPARIN SODIUM (PORCINE) 1000 UNIT/ML DIALYSIS: 20.0000 [IU]/kg | INTRAMUSCULAR | Status: DC | PRN

## 2021-11-23 MED ORDER — THIAMINE MONONITRATE 100 MG PO TABS
100.0000 mg | ORAL_TABLET | Freq: Every day | ORAL | Status: DC
  Administered 2021-11-24 – 2021-12-14 (×21): 100 mg via ORAL
  Filled 2021-11-23 (×22): qty 1

## 2021-11-23 MED ORDER — DOXERCALCIFEROL 4 MCG/2ML IV SOLN
7.0000 ug | INTRAVENOUS | Status: DC
  Administered 2021-11-25 – 2021-12-14 (×2): 7 ug via INTRAVENOUS
  Filled 2021-11-23 (×9): qty 4

## 2021-11-23 MED ORDER — ASPIRIN 81 MG PO CHEW
81.0000 mg | CHEWABLE_TABLET | Freq: Every day | ORAL | Status: DC
  Administered 2021-11-24 – 2021-12-14 (×21): 81 mg via ORAL
  Filled 2021-11-23 (×22): qty 1

## 2021-11-23 MED ORDER — ACETAMINOPHEN 325 MG PO TABS
650.0000 mg | ORAL_TABLET | ORAL | Status: DC | PRN
  Administered 2021-11-24 – 2021-11-25 (×2): 650 mg via ORAL
  Filled 2021-11-23 (×3): qty 2

## 2021-11-23 MED ORDER — METOPROLOL SUCCINATE ER 25 MG PO TB24
25.0000 mg | ORAL_TABLET | Freq: Every day | ORAL | Status: DC
  Administered 2021-11-24 – 2021-11-28 (×4): 25 mg via ORAL
  Filled 2021-11-23 (×4): qty 1

## 2021-11-23 MED ORDER — PENTAFLUOROPROP-TETRAFLUOROETH EX AERO: 1.0000 | INHALATION_SPRAY | CUTANEOUS | Status: DC | PRN

## 2021-11-23 MED ORDER — OXYCODONE HCL 10 MG PO TABS: 10.0000 mg | ORAL_TABLET | ORAL | Status: AC | PRN

## 2021-11-23 MED ORDER — METOPROLOL SUCCINATE ER 25 MG PO TB24: 25.0000 mg | ORAL_TABLET | Freq: Every day | ORAL | Status: AC

## 2021-11-23 NOTE — H&P (Signed)
Physical Medicine and Rehabilitation Admission H&P        Chief Complaint  Patient presents with   Weakness  : HPI: Susan Horton is a 51 year old right-handed female with history of end-stage renal disease with hemodialysis, CAD with stenting maintained on aspirin and Plavix, hypertension, diastolic and systolic congestive heart failure, diabetes mellitus peripheral neuropathy, lower extremity DVT, asthma/quit smoking 5 years ago, peripheral vascular disease with history of left external iliac artery and left SFA stenting.  She underwent recent excision of tumoral calcinosis left hip/thigh, drain and sutures removed 10/20/2021 without evidence of infection per office notes at [facility].  Per chart review use of straight point cane prior to admission.  1 level home.  Plans to stay with her sister on discharge.  Presented 11/08/2021 with progressive weakness and fall since 11/06/2021.  On arrival she is found to be febrile as well as tachycardic with mild altered mental status.  She was admitted to the ICU for possible sepsis and placed on broad-spectrum antibiotics 11/08/2021 - 11/16/2021.  Cranial CT scan negative.  Chest x-ray showed vague hazy opacity right mid and lower lung worrisome for early developing infiltrate/pneumonia.  She did have some noted drainage from the left hip.  X-rays unilateral with and without contrast of pelvis showed no fracture or dislocation identified.  There was a large area of soft tissue calcification previously described as tumoral calcinosis measuring 21 x 15 cm versus 21 x 14 on the comparison study.  CT of left hip showed extensive calcification with areas layering hyperdense sediment involving the proximal extensor, gluteal and flexor compartment muscles of the left thigh with overall appearance most consistent with tumoral calcinosis.  Soft tissue edema in the subcutaneous fat along the proximal lateral hip and thigh reflecting mild cellulitis.  Admission  chemistries potassium 6.9 BUN 117 creatinine 10.68, WBC 12,800, hemoglobin 8.8, SARS coronavirus negative, lactic acid 2.0-2.5, procalcitonin 9.57.  Echocardiogram with ejection fraction of 45 to 50% right ventricular systolic function moderately reduced.  Nephrology consulted with hemodialysis ongoing.  Vascular surgery consulted 11/14/2021 due to left foot tenderness and known peripheral arterial disease and concern for occlusion of her stents..  Aortogram completed undergoing stent of right common iliac artery as well as left common iliac artery with drug-coated balloon angioplasty left common femoral artery and stent of left SFA 11/16/2021 per Dr. [name].  Patient continues to have some leukocytosis with all antibiotic treatment completed 11/08/2021 - 11/16/2021 and lattest WBC 20,200.  Initially concern for possible bacteremia in patient with vascular access on hemodialysis.  Cultures done after antibiotics showing no growth to date and patient remains afebrile.  He remains on Plavix and aspirin as prior to admission.  Subcutaneous heparin was added for DVT prophylaxis.  Patient with abrupt onset bilateral sensorineural hearing loss confirmed by audiology 11/7 though symptoms have improved and recommendations follow-up outpatient.  Therapy evaluations completed due to patient decreased functional mobility was admitted for a comprehensive rehab program.   Review of Systems  Constitutional:  Positive for fever and malaise/fatigue.  HENT:  Negative for hearing loss.   Eyes:  Negative for discharge.  Respiratory:  Positive for shortness of breath. Negative for cough and wheezing.   Cardiovascular:  Positive for leg swelling. Negative for chest pain and claudication.  Gastrointestinal:  Positive for constipation. Negative for heartburn, nausea and vomiting.       GERD  Genitourinary:  Negative for dysuria, flank pain and hematuria.  Musculoskeletal:  Positive for joint pain and myalgias.  Skin:  Negative for  rash.  Neurological:  Positive for weakness and headaches.  All other systems reviewed and are negative.       Past Medical History:  Diagnosis Date   Abnormal stress test     Anemia     Angio-edema     Asthma     CAD (coronary artery disease)      DES to mid LAD July 2018, residual moderate RCA disease   Cataract     CKD (chronic kidney disease) stage 4, GFR 15-29 ml/min (HCC)      Dialysis T/Th/Sa   DVT (deep venous thrombosis) ([place])      1996 during pregnancy, 2015 left leg   Eczema     Essential hypertension 12/11/2015   Gastroparesis     GERD (gastroesophageal reflux disease)     Gluteal abscess 12/27/2018   Hidradenitis     Migraine     Neuropathy     Peripheral vascular disease (HCC)      blood clot in leg   Progressive angina ([place]) 06/05/2014    Chest pain   Type 2 diabetes mellitus ([place])     Type II diabetes mellitus ([place])     Urticaria           Past Surgical History:  Procedure Laterality Date   A/V FISTULAGRAM N/A 06/22/2017    Procedure: A/V FISTULAGRAM - left arm;  Surgeon: [name], MD;  Location: [facility];  Service: Cardiovascular;  Laterality: N/A;   ABDOMINAL AORTOGRAM W/LOWER EXTREMITY Bilateral 12/14/2018    Procedure: ABDOMINAL AORTOGRAM W/LOWER EXTREMITY;  Surgeon: [name], MD;  Location: [facility];  Service: Cardiovascular;  Laterality: Bilateral;   ABDOMINAL AORTOGRAM W/LOWER EXTREMITY N/A 04/25/2019    Procedure: ABDOMINAL AORTOGRAM W/LOWER EXTREMITY;  Surgeon: [name], MD;  Location: [facility];  Service: Cardiovascular;  Laterality: N/A;   ABDOMINAL AORTOGRAM W/LOWER EXTREMITY Left 07/18/2019    Procedure: ABDOMINAL AORTOGRAM W/LOWER EXTREMITY;  Surgeon: [name], MD;  Location: [facility];  Service: Cardiovascular;  Laterality: Left;   ABDOMINAL AORTOGRAM W/LOWER EXTREMITY N/A 11/16/2021    Procedure: ABDOMINAL AORTOGRAM W/LOWER EXTREMITY;  Surgeon: [name], MD;  Location: [facility];  Service: Cardiovascular;  Laterality: N/A;   ABDOMINOPLASTY       ADENOIDECTOMY       APPENDECTOMY   1995   AV FISTULA PLACEMENT Left 02/01/2017    Procedure: ARTERIOVENOUS BRACHIOCEPHALIC (AV) FISTULA CREATION;  Surgeon: [name], MD;  [facility];  Service: Vascular;  Laterality: Left;   [place] INTERVENTION N/A 08/05/2016    Procedure: Coronary Stent Intervention;  Surgeon: [name], MD;  Location: [facility];  Service: Cardiovascular;  Laterality: N/A;   ESOPHAGOGASTRODUODENOSCOPY (EGD) WITH PROPOFOL N/A 07/01/2020    Procedure: ESOPHAGOGASTRODUODENOSCOPY (EGD) WITH PROPOFOL;  Surgeon: Gatha Mayer, MD;  Location: [facility];  Service: Endoscopy;  Laterality: N/A;   EXPLORATORY LAPAROTOMY   08/14/2005    lysis of adhesions, drainage of tubo-ovarian abscess   FISTULA SUPERFICIALIZATION Left 09/14/2017    Procedure: FISTULA SUPERFICIALIZATION ARTERIOVENOUS FISTULA LEFT ARM;  Surgeon: [name], MD;  Location: [facility];  Service: Vascular;  Laterality: Left;   FLEXIBLE SIGMOIDOSCOPY N/A 01/04/2019    Procedure: FLEXIBLE SIGMOIDOSCOPY;  Surgeon: Clarene Essex, MD;  Location: [facility];  Service: Endoscopy;  Laterality: N/A;   HYDRADENITIS EXCISION   02/08/2011    Procedure: EXCISION HYDRADENITIS GROIN;  Surgeon: [name], MD;  Location: [facility];  Service: General;  Laterality: N/A;  Excisioin of Hidradenitis Left groin   INCISION AND DRAINAGE ABSCESS N/A 01/11/2019    Procedure: INCISION AND DRAINAGE SACRAL ABSCESS;  Surgeon: Georganna Skeans, MD;  Location: [facility];  Service: General;  Laterality: N/A;   INGUINAL HIDRADENITIS EXCISION   07/06/2010    bilateral   INSERTION OF DIALYSIS CATHETER N/A 09/14/2017    Procedure: INSERTION OF TUNNELED DIALYSIS CATHETER Right Internal Jugular;  Surgeon:  [name], MD;  Location: [facility];  Service: Vascular;  Laterality: N/A;   IR FLUORO GUIDE CV LINE RIGHT   03/01/2019   IR US GUIDE BX ASP/DRAIN   01/01/2019   IR US GUIDE VASC ACCESS RIGHT   03/01/2019   LEFT HEART CATH AND CORONARY ANGIOGRAPHY N/A 08/05/2016    Procedure: Left Heart Cath and Coronary Angiography;  Surgeon: [name], MD;  Location: [facility];  Service: Cardiovascular;  Laterality: N/A;   PERIPHERAL VASCULAR INTERVENTION Left 12/14/2018    Procedure: PERIPHERAL VASCULAR INTERVENTION;  Surgeon: [name], MD;  Location: [facility];  Service: Cardiovascular;  Laterality: Left;  SFA   PERIPHERAL VASCULAR INTERVENTION Left 04/25/2019    Procedure: PERIPHERAL VASCULAR INTERVENTION;  Surgeon: [name], MD;  Location: [facility];  Service: Cardiovascular;  Laterality: Left;  right superficial femoral, and left iliac   PERIPHERAL VASCULAR INTERVENTION Left 07/18/2019    Procedure: PERIPHERAL VASCULAR INTERVENTION;  Surgeon: [name], MD;  Location: [facility];  Service: Cardiovascular;  Laterality: Left;  external iliac   REDUCTION MAMMAPLASTY   2002   TONSILLECTOMY       TUBAL LIGATION   1996   VAGINAL HYSTERECTOMY   08/04/2005    and cysto         Family History  Problem Relation Age of Onset   Allergic rhinitis Mother     Hypertension Father     Allergic rhinitis Father     Hypertension Sister     Allergic rhinitis Sister     Hypertension Brother     Allergic rhinitis Brother     Breast cancer Cousin          x2    Social History:  reports that she quit smoking about 5 years ago. Her smoking use included cigarettes. She has never used smokeless tobacco. She reports current drug use. Frequency: 2.00 times per week. Drug: Marijuana. She reports that she does not drink alcohol. Allergies:      Allergies  Allergen Reactions   Repatha [Evolocumab] Itching   Lisinopril Cough           Medications Prior to Admission  Medication Sig Dispense Refill   acetaminophen-codeine (TYLENOL #3) 300-30 MG tablet Take 1 tablet by mouth at bedtime as needed for moderate pain. 30 tablet 0   albuterol (VENTOLIN HFA) 108 (90 Base) MCG/ACT inhaler Inhale 1-2 puffs into the lungs every 6 (six) hours as needed for wheezing or shortness of breath. 18 g 2   amLODipine (NORVASC) 10 MG tablet TAKE 1 TABLET BY MOUTH EVERY DAY (Patient taking differently: Take 10 mg by mouth daily.) 90 tablet 3   aspirin 81 MG chewable tablet Chew 1 tablet (81 mg total) by mouth daily. 30 tablet 0   atorvastatin (LIPITOR) 40  MG tablet TAKE 1 TABLET BY MOUTH EVERY DAY (Patient taking differently: Take 40 mg by mouth daily.) 90 tablet 1   B Complex-C-Zn-Folic Acid (DIALYVITE/ZINC) TABS Take 1 tablet by mouth daily.       calcitRIOL (ROCALTROL) 0.5 MCG capsule Take 1.5 mcg by mouth daily.       calcium acetate (PHOSLO) 667 MG capsule Take 667 mg by mouth 3 (three) times daily with meals.   10   clopidogrel (PLAVIX) 75 MG tablet TAKE 1 TABLET (75 MG TOTAL) BY MOUTH DAILY WITH BREAKFAST. 90 tablet 3   cyclobenzaprine (FLEXERIL) 10 MG tablet Take 1 tablet (10 mg total) by mouth 2 (two) times daily as needed. 60 tablet 1   fluticasone furoate-vilanterol (BREO ELLIPTA) 200-25 MCG/ACT AEPB Inhale 1 puff into the lungs daily. 60 each 3   gabapentin (NEURONTIN) 300 MG capsule Take 300 mg by mouth 3 (three) times daily.       hydrALAZINE (APRESOLINE) 50 MG tablet Take 50 mg by mouth every 8 (eight) hours.       ipratropium-albuterol (DUONEB) 0.5-2.5 (3) MG/3ML SOLN Take 3 mLs by nebulization every 6 (six) hours as needed. 360 mL 2   lanthanum (FOSRENOL) 1000 MG chewable tablet Chew 1,000 mg by mouth 5 (five) times daily as needed.       Methoxy PEG-Epoetin Beta (MIRCERA IJ) Inject into the skin.       metoprolol succinate (TOPROL-XL) 50 MG 24 hr tablet TAKE 1.5 TABLETS BY MOUTH DAILY 135 tablet 3   montelukast (SINGULAIR) 10 MG  tablet Take 1 tablet (10 mg total) by mouth at bedtime. 90 tablet 1   ondansetron (ZOFRAN ODT) 4 MG disintegrating tablet Take 1 tablet (4 mg total) by mouth every 8 (eight) hours as needed for nausea or vomiting. 20 tablet 0   oxyCODONE-acetaminophen (PERCOCET/ROXICET) 5-325 MG tablet Take 1 tablet by mouth every 8 (eight) hours as needed for severe pain. 21 tablet 0   promethazine (PHENERGAN) 25 MG tablet Take 1 tablet (25 mg total) by mouth every 8 (eight) hours as needed for nausea or vomiting. 30 tablet 1   sucralfate (CARAFATE) 1 GM/10ML suspension Take 10 mLs (1 g total) by mouth 4 (four) times daily -  with meals and at bedtime. 420 mL 0   HYDROcodone-acetaminophen (NORCO) 10-325 MG tablet Take 0.5 tablets by mouth every 8 (eight) hours as needed for severe pain. (Patient not taking: Reported on 11/08/2021) 8 tablet 0   iron sucrose (VENOFER) 20 MG/ML injection Iron Sucrose (Venofer) (Patient not taking: Reported on 11/08/2021)       isosorbide mononitrate (IMDUR) 30 MG 24 hr tablet Take 15 mg by mouth daily as needed (chest pain).       Misc. Devices MISC Electric scooter for mobility 1 each 0   nitroGLYCERIN (NITROSTAT) 0.4 MG SL tablet Place 1 tablet (0.4 mg total) under the tongue every 5 (five) minutes as needed for chest pain. 25 tablet 2   pantoprazole (PROTONIX) 40 MG tablet Take 1 tablet (40 mg total) by mouth daily. (Patient not taking: Reported on 11/08/2021) 30 tablet 0   PARoxetine (PAXIL) 20 MG tablet Take 1 tablet (20 mg total) by mouth daily. For hot flashes (Patient not taking: Reported on 11/08/2021) 30 tablet 3   PRALUENT 75 MG/ML SOAJ INJECT 75 MG INTO THE SKIN EVERY 14 (FOURTEEN) DAYS. 2 mL 11          Home: Home Living Family/patient expects to be discharged to:: Private residence  Living Arrangements: Other (Comment) (Lived alone until hip surgery, now with sister) Available Help at Discharge: Family, Available 24 hours/day Type of Home: House Home Access: Level  entry Home Layout: One level Bathroom Shower/Tub: Chiropodist: Standard Bathroom Accessibility: Yes Home Equipment: Rollator (4 wheels), Cane - single point Additional Comments: Patient wil be stayig at sister's home initially.  Above information is sister.  Lives With: Family   Functional History: Prior Function Prior Level of Function : Independent/Modified Independent (until 3 weeks pta was Mod I) Mobility Comments: amb with SPC PTA ADLs Comments: sister assists with ADLs since L hip surgery   Functional Status:  Mobility: Bed Mobility Overal bed mobility: Needs Assistance Bed Mobility: Sit to Supine Rolling: Min assist Supine to sit: HOB elevated, Min guard Sit to supine: Min guard General bed mobility comments: min guard for safety with management of LE back into bed Transfers Overall transfer level: Needs assistance Equipment used: None Transfers: Bed to chair/wheelchair/BSC Sit to Stand: +2 physical assistance, +2 safety/equipment, From elevated surface, Mod assist, Max assist Bed to/from chair/wheelchair/BSC transfer type:: Squat pivot Squat pivot transfers: Min assist Transfer via Lift Equipment: Stedy General transfer comment: pt able to sequence pivot transfer from chair back to bed for HD Ambulation/Gait General Gait Details: unable due to L foot pain with weightbearing   ADL: ADL Overall ADL's : Needs assistance/impaired Eating/Feeding: Set up, Sitting Eating/Feeding Details (indicate cue type and reason): in recliner Grooming: Wash/dry hands, Wash/dry face, Maximal assistance, Bed level Upper Body Bathing: Maximal assistance, Bed level Lower Body Bathing: Total assistance, Bed level Upper Body Dressing : Maximal assistance, Sitting Lower Body Dressing: Maximal assistance Lower Body Dressing Details (indicate cue type and reason): unable to donn socks on RLE, did not want sock on LLE Toilet Transfer: Maximal assistance, BSC/3in1,  Squat-pivot Toileting- Clothing Manipulation and Hygiene: Total assistance, Bed level General ADL Comments: limited by pain   Cognition: Cognition Overall Cognitive Status: Within Functional Limits for tasks assessed Orientation Level: Oriented X4 Cognition Arousal/Alertness: Awake/alert Behavior During Therapy: WFL for tasks assessed/performed Overall Cognitive Status: Within Functional Limits for tasks assessed Area of Impairment: Orientation, Attention, Memory, Following commands, Safety/judgement, Awareness, Problem solving Orientation Level: Disoriented to, Time, Place, Situation Current Attention Level: Focused Memory: Decreased short-term memory Following Commands: Follows one step commands with increased time, Follows one step commands inconsistently Safety/Judgement: Decreased awareness of safety, Decreased awareness of deficits Awareness: Intellectual Problem Solving: Slow processing, Decreased initiation, Difficulty sequencing, Requires verbal cues, Requires tactile cues General Comments: pt with questions about AIR vs SNF   Physical Exam: Blood pressure (!) 120/49, pulse 80, temperature 98.1 F (36.7 C), temperature source Oral, resp. rate 19, weight 63.7 kg, SpO2 98 %. Physical Exam Constitutional:      General: She is not in acute distress. HENT:     Head: Normocephalic and atraumatic.     Nose: Nose normal.     Mouth/Throat:     Mouth: Mucous membranes are moist.  Eyes:     Extraocular Movements: Extraocular movements intact.     Pupils: Pupils are equal, round, and reactive to light.  Cardiovascular:     Rate and Rhythm: Normal rate and regular rhythm.     Heart sounds: No murmur heard.    No gallop.  Pulmonary:     Effort: Pulmonary effort is normal. No respiratory distress.     Breath sounds: No wheezing.  Abdominal:     General: Bowel sounds are normal. There is no distension.  Palpations: Abdomen is soft.     Tenderness: There is no abdominal  tenderness.  Musculoskeletal:     Cervical back: Normal range of motion.     Comments: Left hip tender with ROM, appears swollen. Large dressing in place. Left foot hypersensitive to touch.  Skin:    Comments: Left hip Xeroform dressing in place.  2 incisions left posterior lateral thigh without odor but with yellowish/tan drainage with clumpy, white debris. No odor. Wounds themselves appear pink without necrotic tissue. Left foot: tip of great toe and 2nd/third toes appear ischemic. AVF in LUE  Neurological:     Mental Status: She is alert.     Comments:    Alert and oriented x 3. Normal insight and awareness. Intact Memory. Normal language and speech. Cranial nerve exam unremarkable. UE grossly 5/5. RLE 3+ to 4/5 prox to 4+/5 distally. LLE 2/5 prox to trace/5 ankle. Stocking glove sensory loss bilateral LE, L>R up to mid calf.   Psychiatric:        Mood and Affect: Mood normal.        Behavior: Behavior normal.        Lab Results Last 48 Hours        Results for orders placed or performed during the hospital encounter of 11/08/21 (from the past 48 hour(s))  Renal function panel     Status: Abnormal    Collection Time: 11/22/21  1:11 AM  Result Value Ref Range    Sodium 139 135 - 145 mmol/L    Potassium 3.4 (L) 3.5 - 5.1 mmol/L    Chloride 95 (L) 98 - 111 mmol/L    CO2 26 22 - 32 mmol/L    Glucose, Bld 169 (H) 70 - 99 mg/dL      Comment: Glucose reference range applies only to samples taken after fasting for at least 8 hours.    BUN 29 (H) 6 - 20 mg/dL    Creatinine, Ser 4.35 (H) 0.44 - 1.00 mg/dL    Calcium 9.4 8.9 - 10.3 mg/dL    Phosphorus 6.1 (H) 2.5 - 4.6 mg/dL    Albumin 1.8 (L) 3.5 - 5.0 g/dL    GFR, Estimated 12 (L) >60 mL/min      Comment: (NOTE) Calculated using the CKD-EPI Creatinine Equation (2021)      Anion gap 18 (H) 5 - 15      Comment: Performed at [facility] [address] [place], [place] [address]  CBC     Status: Abnormal    Collection Time:  11/22/21  1:11 AM  Result Value Ref Range    WBC 20.2 (H) 4.0 - 10.5 K/uL    RBC 3.08 (L) 3.87 - 5.11 MIL/uL    Hemoglobin 7.3 (L) 12.0 - 15.0 g/dL    HCT 24.9 (L) 36.0 - 46.0 %    MCV 80.8 80.0 - 100.0 fL    MCH 23.7 (L) 26.0 - 34.0 pg    MCHC 29.3 (L) 30.0 - 36.0 g/dL    RDW 20.7 (H) 11.5 - 15.5 %    Platelets 490 (H) 150 - 400 K/uL    nRBC 0.0 0.0 - 0.2 %      Comment: Performed at [facility], [place] [address], [place], [place] [address]      Imaging Results (Last 48 hours)  No results found.         Blood pressure (!) 120/49, pulse 80, temperature 98.1 F (36.7 C), temperature source  Oral, resp. rate 19, weight 63.7 kg, SpO2 98 %.   Medical Problem List and Plan: 1. Functional deficits secondary to septic shock/metabolic with debility and encephalopathy. Pt appears cognitively to be at baseline. -  Antibiotic therapy 10/29 - 11/6             -patient may shower if left hip is covered             -ELOS/Goals: 10-12 days, mod I to supervision goals 2.  Antithrombotics: -DVT/anticoagulation:  Pharmaceutical: Heparin             -antiplatelet therapy: Aspirin 81 mg daily and Plavix 75 mg daily 3. Pain Management: Lidoderm patch, oxycodone as needed 4. Mood/Behavior/Sleep: Provide emotional support             -antipsychotic agents: N/A 5. Neuropsych/cognition: This patient is capable of making decisions on her own behalf. 6. Skin/Wound Care: Continue BID to TID dressing changes to left hip. -WOC follow-up for left hip drainage. -completed course of broad spectrum abx on 11/6   -Assistance provided by orthopedics. 7. Fluids/Electrolytes/Nutrition: Routine in and outs with follow-up chemistries 8.  Peripheral vascular disease.  Follow-up vascular surgery for occluded stents status post right common iliac left common iliac stenting with balloon angioplasty left common femoral and stent left SFA 11/16/2021 per Dr. [name] 9.  Tumoral calcinosis.  Recent excision of tumoral  calcinosis left hip thigh at [facility] 10/20/2021.  Weightbearing as tolerated 10.  End-stage renal disease.  Continue hemodialysis as directed 11.  Acute on chronic anemia.  Continue iron supplement 12.  Leukocytosis.  (20k)  -Broad spectrum antibiotic therapy completed 11/16/2021 and monitor.  Latest blood culture showed no growth             -appearance of drainage is concerning. Recent CT (10/30) demonstrating changes c/w tumoral calcinosis and mild cellulitis.              -consider MRI of left hip depending upon drainage, WBC, etc.              -ischemic toes LLE could also be a source             -continue wound care as above             -pt feels well otherwise and no other signs of infection             -consider H/O consult if persistent 13.  Asthma without exacerbation as well as remote history of tobacco abuse.  Continue inhalers as directed check oxygen saturations every shift 14. Diet controlled Diabetes mellitus. Blood sugar checks discontinued 15.  Hypertension.  Toprol-XL 25 mg daily.  Monitor with increased mobility 16.  Abrupt onset bilateral sensorineural hearing loss.  Confirmed by audiology 11/7 the symptoms have resolved.  Follow-up outpatient. 17.  Diastolic and systolic congestive heart failure.  Monitor for any signs of fluid overload Cathlyn Parsons, PA-C 11/23/2021  I have personally performed a face to face diagnostic evaluation of this patient and formulated the key components of the plan.  Additionally, I have personally reviewed laboratory data, imaging studies, as well as relevant notes and concur with the physician assistant's documentation above.  The patient's status has not changed from the original H&P.  Any changes in documentation from the acute care chart have been noted above.  Meredith Staggers, MD, [facility]

## 2021-11-23 NOTE — Progress Notes (Signed)
Subjective:  Seen in room. Up with PT. Doing well this am. No concerns. Dialysis today.   Objective Vital signs in last 24 hours: Vitals:   11/22/21 2156 11/23/21 0500 11/23/21 0555 11/23/21 1200  BP: (!) 120/49  (!) 139/59 (!) 116/54  Pulse: 80  84 86  Resp: '19  19 18  '$ Temp: 98.1 F (36.7 C)  97.7 F (36.5 C) 97.7 F (36.5 C)  TempSrc: Oral  Oral Oral  SpO2: 98%  98% 97%  Weight:  63.7 kg       Physical Exam: General: Alert l chronically ill-appearing female NAD  Heart: RRR no MRG Lungs: CTA anteriorly, nonlabored breathing,  Abdomen: NABS, soft, NT , ND Extremities: No pedal edema Dialysis Access: LUE AVF + bruit     OP HD =[facility] 3h 52mn 400/600  61kg  2/2 bath P2  Hep 4000  LUE AVF - mircera 200 mcg IV q2, last 10/16, due 10/30 - venofer '100mg'$  IV tiw thru 11/06 - doxercalciferol 7 ug tiw - sod thiosulfate 25 gm iv tiw   Assessment/Plan: Septic shock: Febrile - ?hip infection v. HCAP, was on Vanc/Cefepime/Flagyl -> finished course of Zosyn. 2. ESRD: Required CRRT on admit due to AMS, high BUN, etc. Now back to iHD and tolerating well. Still had her temp cath- removed 11/08.  Uses L AVF for dialysis.  On MWF schedule. For HD today 3. Encephalopathy: D/t shock/?meds -has resolved , but some decreased hearing and foot drop 11/7- now spontaneously improving. No offending meds identified.  4. Myoclonus: D/t meds, severe (cefepime, gabapentin and narcotics) now resolved.  5. Hyperkalemia: On admit, resolved with dialysis. 6. HTN/volume: BP controlled, euvolemic on exam but volume up per weights, but bed weight, UF as tolerated on HD 7. Anemia of ESRD: Hgb 7.9 -improving. Continue max dose Aranesp 2030m weekly while here. 8. Secondary HPTH: CorrCa ok, Phos not to goal.   Reports trouble tolerating any binder secondary to gastroparesis-we will hold binder for now follow-up trend discussion about following diet direction-was on Fosrenol, no iron binders with ferritin  5000 9. Nutrition: Alb low, 1.8< 1.9<1.7 continue supplements sometimes refusing Nepro 10. 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 [facility] op note described 1 cup liquid being removed. Per our imaging her, size appears the same which is unfortunate. re-cultured 11/2 - NGTD. 11. T2DM with severe gastroparesis: Ongoing issue, s/p esophageal myotomy 09/17/21. Has issues swallow/keeping pills down. 12. L foot pain; Xray negative, uric acid normal. VVS consulted, s/p LE angiography with improvement in pain  [facility] 11/23/2021,12:31 PM  Labs: Basic Metabolic Panel: Recent Labs  Lab 11/21/21 0101 11/22/21 0111 11/23/21 0550  NA 137 139 137  K 3.3* 3.4* 3.7  CL 97* 95* 96*  CO2 '28 26 26  '$ GLUCOSE 111* 169* 110*  BUN 14 29* 39*  CREATININE 3.07* 4.35* 5.72*  CALCIUM 9.2 9.4 9.2  PHOS 5.0* 6.1* 6.8*    Liver Function Tests: Recent Labs  Lab 11/21/21 0101 11/22/21 0111 11/23/21 0550  ALBUMIN 1.9* 1.8* 1.9*    No results for input(s): "LIPASE", "AMYLASE" in the last 168 hours. No results for input(s): "AMMONIA" in the last 168 hours. CBC: Recent Labs  Lab 11/17/21 0821 11/20/21 0500 11/21/21 0101 11/22/21 0111 11/23/21 0550  WBC 17.6* 21.3* 20.4* 20.2* 20.2*  NEUTROABS  --   --   --   --  17.1*  HGB 7.5* 7.1* 7.3* 7.3* 7.9*  HCT 25.5* 24.6* 25.7* 24.9* 27.7*  MCV 80.2 83.1 82.4 80.8 82.9  PLT 415* 419* 427* 490* 525*    Cardiac Enzymes: No results for input(s): "CKTOTAL", "CKMB", "CKMBINDEX", "TROPONINI" in the last 168 hours. CBG: Recent Labs  Lab 11/19/21 2128 11/20/21 0801 11/20/21 1420 11/20/21 1720 11/20/21 2121  GLUCAP 169* 141* 93 141* 128*     Studies/Results: No results found. Medications:    aspirin  81 mg Oral Daily   clopidogrel  75 mg Oral Q breakfast   darbepoetin (ARANESP)  injection - NON-DIALYSIS  200 mcg Subcutaneous Q Mon-1800   doxercalciferol  7 mcg Intravenous Q M,W,F-HD   heparin  5,000 Units Subcutaneous Q8H   lidocaine  1 patch Transdermal Q24H   metoCLOPramide  10 mg Oral TID AC   metoprolol succinate  25 mg Oral Daily   montelukast  10 mg Oral QHS   pantoprazole  40 mg Oral Daily   thiamine  100 mg Oral Daily

## 2021-11-23 NOTE — Progress Notes (Signed)
[facility]  Subjective  - Seems to be improving slowly.  Pain is better controlled   Objective (!) 139/59 84 97.7 F (36.5 C) (Oral) 19 98%  Intake/Output Summary (Last 24 hours) at 11/23/2021 0806 Last data filed at 11/23/2021 0600 Gross per 24 hour  Intake 487 ml  Output 0 ml  Net 487 ml    Brisk doppler PT left LE Very minimal motor in the left toes/ankle No edema noted Right groin soft General no acute distress   Assessment/Planning: 51 year old female with history of left external iliac artery and left SFA stenting for short distance claudication.  She has now been admitted with left hip infection and has severe rest pain in the left lower extremity with concern for occlusion of her stents     S/P stent of left common iliac artery with atherectomy and DCB of the left common femoral artery and stent of the left SFA in the setting of multilevel high-grade stenosis  Brisk PT signal maintained She is still pending AFO Continued hypersensitivity to touch Continue Aspirin and Plavix      Susan Horton 11/23/2021 8:06 AM --  Laboratory Lab Results: Recent Labs    11/22/21 0111 11/23/21 0550  WBC 20.2* 20.2*  HGB 7.3* 7.9*  HCT 24.9* 27.7*  PLT 490* 525*   BMET Recent Labs    11/22/21 0111 11/23/21 0550  NA 139 137  K 3.4* 3.7  CL 95* 96*  CO2 26 26  GLUCOSE 169* 110*  BUN 29* 39*  CREATININE 4.35* 5.72*  CALCIUM 9.4 9.2    COAG Lab Results  Component Value Date   INR 1.3 (H) 11/09/2021   INR 1.2 11/08/2021   INR 1.1 01/09/2019   No results found for: "PTT"

## 2021-11-23 NOTE — Progress Notes (Signed)
Physical Therapy Treatment Patient Details Name: Susan Horton MRN: [id number] DOB: May 28, 1970 Today's Date: 11/23/2021   History of Present Illness 51 y/o female presented to ED on 11/08/21 for progressive fatigue and weakness after fall on 10/27. Admitted for septic shock with unclear origin. Recent L hip tumoral calcinosis surgery a few weeks ago at [facility] with associated drainage from L hip. Underwent  stent of left common iliac artery with atherectomy and DCB of the left common femoral artery and stent of the left SFA in the setting of multilevel high-grade stenosis with rest pain on 11/6.  PMH: ESRD, CAD, HTN, CHF, T2DM with peripheral neuropathy and gastroparesis.    PT Comments    On entry pt supine in bed with L hip and knee flexed, working on ranging her L ankle. Pt reports much less pain in L foot today and is very pleasantly surprised that her L foot pain in dependent position is tolerable today. Pt able to progress her mobility with min guard for bed mobility and modA for stepping transfer to chair. MD in room during session and reports pt ready for discharge to AIR. Pt is very motivated to getting back to ambulation. PT continues to recommend AFO fitting be deferred until pt can better tolerate, increased pressure on L foot and calf. D/c plans remain appropriate.     Recommendations for follow up therapy are one component of a multi-disciplinary discharge planning process, led by the attending physician.  Recommendations may be updated based on patient status, additional functional criteria and insurance authorization.  Follow Up Recommendations  Acute inpatient rehab (3hours/day)     Assistance Recommended at Discharge Frequent or constant Supervision/Assistance  Patient can return home with the following A lot of help with walking and/or transfers;A lot of help with bathing/dressing/bathroom;Assistance with cooking/housework;Direct supervision/assist for financial  management;Direct supervision/assist for medications management;Assist for transportation;Help with stairs or ramp for entrance   Equipment Recommendations  Other (comment) (TBD)    Recommendations for Other Services Rehab consult     Precautions / Restrictions Precautions Precautions: Fall Precaution Comments: recent hip sx to L hip; left foot drop and bil hearing loss noted 11/7 Restrictions Weight Bearing Restrictions: No     Mobility  Bed Mobility Overal bed mobility: Needs Assistance Bed Mobility: Sit to Supine       Sit to supine: Min guard   General bed mobility comments: min guard for safety    Transfers Overall transfer level: Needs assistance   Transfers: Bed to chair/wheelchair/BSC Sit to Stand: Mod assist, From elevated surface   Step pivot transfers: Mod assist       General transfer comment: pt with L LE on bed pad as she can not tolerate sock, attempted x2 to come to stand with therapist on R side, unable to come to fully upright, PT positioned in front of pt and able to use gait belt to provide power up with cuing for increased forward lean, with R foot resting on bed pad pt able to perform hopping pivot to chair    Ambulation/Gait               General Gait Details: unable to tolerate weightbearing through L foot      Balance Overall balance assessment: Needs assistance Sitting-balance support: Feet supported, No upper extremity supported Sitting balance-Leahy Scale: Fair Sitting balance - Comments: Can sit EOB on her own.   Standing balance support: Bilateral upper extremity supported, During functional activity Standing balance-Leahy Scale: Poor Standing balance  comment: able to come to fully upright but requires outside physical assist to steady                            Cognition Arousal/Alertness: Awake/alert Behavior During Therapy: WFL for tasks assessed/performed Overall Cognitive Status: Within Functional Limits  for tasks assessed                                          Exercises General Exercises - Lower Extremity Ankle Circles/Pumps: Left, AAROM, 10 reps Ankle Circles/Pumps Limitations: able to flex hip and knee and provide self mobilization with decreased pain    General Comments General comments (skin integrity, edema, etc.): Pt with less pain in L foot today, and able to provide self AAROM in ankle      Pertinent Vitals/Pain Pain Assessment Pain Assessment: Faces Faces Pain Scale: Hurts little more Breathing: normal Negative Vocalization: none Facial Expression: smiling or inexpressive Body Language: relaxed Consolability: no need to console PAINAD Score: 0 Pain Location: Lt foot in dependent position, however tolerance to dependent position much improved. Pain Descriptors / Indicators: Grimacing, Guarding, Moaning, Throbbing Pain Intervention(s): Limited activity within patient's tolerance, Monitored during session, Repositioned     PT Goals (current goals can now be found in the care plan section) Acute Rehab PT Goals Patient Stated Goal: get better and back to walking PT Goal Formulation: With patient/family Time For Goal Achievement: 12/01/21 Potential to Achieve Goals: Fair    Frequency    Min 3X/week      PT Plan Current plan remains appropriate    Co-evaluation              AM-PAC PT "6 Clicks" Mobility   Outcome Measure  Help needed turning from your back to your side while in a flat bed without using bedrails?: A Little Help needed moving from lying on your back to sitting on the side of a flat bed without using bedrails?: A Little Help needed moving to and from a bed to a chair (including a wheelchair)?: Total Help needed standing up from a chair using your arms (e.g., wheelchair or bedside chair)?: Total Help needed to walk in hospital room?: Total Help needed climbing 3-5 steps with a railing? : Total 6 Click Score: 10    End  of Session Equipment Utilized During Treatment: Gait belt Activity Tolerance: Patient limited by pain Patient left: in bed;Other (comment) (being transferred to HD at end of session) Nurse Communication: Mobility status PT Visit Diagnosis: Unsteadiness on feet (R26.81);Muscle weakness (generalized) (M62.81);History of falling (Z91.81);Difficulty in walking, not elsewhere classified (R26.2);Other symptoms and signs involving the nervous system (O16.073)     Time: 7106-2694 PT Time Calculation (min) (ACUTE ONLY): 26 min  Charges:  $Therapeutic Exercise: 8-22 mins $Therapeutic Activity: 8-22 mins                     Kramer Hanrahan B. Migdalia Dk PT, DPT Acute Rehabilitation Services Please use secure chat or  Call Office [phone]    [place] 11/23/2021, 12:45 PM

## 2021-11-23 NOTE — Progress Notes (Signed)
Meredith Staggers, MD  Physician Physical Medicine and Rehabilitation   PMR Pre-admission     Signed   Date of Service: 11/19/2021  6:31 PM  Related encounter: ED to Hosp-Admission (Current) from 11/08/2021 in [facility]   Signed      Show:Clear all _0 Written_1 Templated_2 Copied  Added by: _3 Cristina Gong, RN_4 Meredith Staggers, MD  _5 Hover for details PMR Admission Coordinator Pre-Admission Assessment   Patient: Susan Horton is an 51 y.o., female MRN: [id number] DOB: [birth year] Height:   Weight: 63.7 kg   Insurance Information HMO:     PPO:      PCP:      IPA:      80/20:      OTHER:  PRIMARY: Medicare a and b      Policy#: [id number]      Subscriber: pt Benefits:  Phone #: passport one source online     Name: 11/18/21 Eff. Date: 12/11/2017     Deduct: $1600      Out of Pocket Max: none      Life Max: none CIR: 100%      SNF: 20 full days Outpatient: 80%     Co-Pay: 20% Home Health: 100%      Co-Pay: none DME: 80%     Co-Pay: 20% Providers: pt choice  SECONDARY: BCBS supplement      Policy#:[id number]   Financial Counselor:       Phone#:    The "Data Collection Information Summary" for patients in Inpatient Rehabilitation Facilities with attached "Privacy Act [place] Records" was provided and verbally reviewed with: Patient   Emergency Contact Information Contact Information       Name Relation Home Work Mobile    McDaniels,Samuel Son     [phone]    Gemma Payor     [phone]         Current Medical History  Patient Admitting Diagnosis: Debility   History of Present Illness:   51 year old right-handed female with history of end-stage renal disease with hemodialysis, CAD with stenting maintained on aspirin and Plavix, hypertension, diastolic and systolic congestive heart failure, diabetes mellitus peripheral neuropathy, lower extremity DVT, asthma/quit smoking 5 years ago, peripheral vascular  disease with history of left external iliac artery and left SFA stenting.  She underwent recent excision of tumoral calcinosis left hip/thigh, drain and sutures removed 10/20/2021 without evidence of infection per office notes at [facility].      Presented 11/08/2021 with progressive weakness and fall since 11/06/2021.  On arrival she is found to be febrile as well as tachycardic with mild altered mental status.  She was admitted to the ICU for possible sepsis and placed on broad-spectrum antibiotics 11/08/2021 - 11/16/2021.  Cranial CT scan negative.  Chest x-ray showed vague hazy opacity right mid and lower lung worrisome for early developing infiltrate/pneumonia.  She did have some noted drainage from the left hip.  X-rays unilateral with and without contrast of pelvis showed no fracture or dislocation identified.  There was a large area of soft tissue calcification previously described as tumoral calcinosis measuring 21 x 15 cm versus 21 x 14 on the comparison study.  CT of left hip showed extensive calcification with areas layering hyperdense sediment involving the proximal extensor, gluteal and flexor compartment muscles of the left thigh with overall appearance most consistent with tumoral calcinosis.  Soft tissue edema in the subcutaneous fat along the proximal lateral hip and thigh reflecting mild cellulitis.  Admission  chemistries potassium 6.9 BUN 117 creatinine 10.68, WBC 12,800, hemoglobin 8.8, SARS coronavirus negative, lactic acid 2.0-2.5, procalcitonin 9.57.  Echocardiogram with ejection fraction of 45 to 50% right ventricular systolic function moderately reduced.  Nephrology consulted with hemodialysis ongoing.     Vascular surgery consulted 11/14/2021 due to left foot tenderness and known peripheral arterial disease and concern for occlusion of her stents..  Aortogram completed undergoing stent of right common iliac artery as well as left common iliac artery with drug-coated balloon  angioplasty left common femoral artery and stent of left SFA 11/16/2021 per Dr. [name].  Patient continues to have some leukocytosis with all antibiotic treatment completed 11/08/2021 - 11/16/2021 and lattest WBC 20,200.  Initially concern for possible bacteremia in patient with vascular access on hemodialysis.  Cultures done after antibiotics showing no growth to date and patient remains afebrile.     She remains on Plavix and aspirin as prior to admission.  Subcutaneous heparin was added for DVT prophylaxis.  Patient with abrupt onset bilateral sensorineural hearing loss confirmed by audiology 11/7 though symptoms have improved and recommendations follow-up outpatient.     Patient's medical record from [facility] has been reviewed by the rehabilitation admission coordinator and physician.   Past Medical History      Past Medical History:  Diagnosis Date   Abnormal stress test     Anemia     Angio-edema     Asthma     CAD (coronary artery disease)      DES to mid LAD July 2018, residual moderate RCA disease   Cataract     CKD (chronic kidney disease) stage 4, GFR 15-29 ml/min (HCC)      Dialysis T/Th/Sa   DVT (deep venous thrombosis) ([place])      1996 during pregnancy, 2015 left leg   Eczema     Essential hypertension 12/11/2015   Gastroparesis     GERD (gastroesophageal reflux disease)     Gluteal abscess 12/27/2018   Hidradenitis     Migraine     Neuropathy     Peripheral vascular disease (HCC)      blood clot in leg   Progressive angina ([place]) 06/05/2014    Chest pain   Type 2 diabetes mellitus ([place])     Type II diabetes mellitus ([place])     Urticaria      Has the patient had major surgery during 100 days prior to admission? Yes   Family History   family history includes Allergic rhinitis in her brother, father, mother, and sister; Breast cancer in her cousin; Hypertension in her brother, father, and sister.   Current Medications   Current Facility-Administered  Medications:    acetaminophen (TYLENOL) tablet 650 mg, 650 mg, Oral, Q6H PRN, [name], MD, 650 mg at 11/21/21 0558   acetaminophen (TYLENOL) tablet 650 mg, 650 mg, Oral, Q4H PRN, [name], MD   aspirin chewable tablet 81 mg, 81 mg, Oral, Daily, [name], MD, 81 mg at 11/22/21 5956   clopidogrel (PLAVIX) tablet 75 mg, 75 mg, Oral, Q breakfast, [name], MD, 75 mg at 11/22/21 3875   Darbepoetin Alfa (ARANESP) injection 200 mcg, 200 mcg, Subcutaneous, Q Mon-1800, [name], MD, 200 mcg at 11/09/21 1801   doxercalciferol (HECTOROL) injection 7 mcg, 7 mcg, Intravenous, Q M,W,F-HD, [name], MD, 7 mcg at 11/20/21 1052   fluticasone furoate-vilanterol (BREO ELLIPTA) 200-25 MCG/ACT 1 puff, 1 puff, Inhalation, Daily PRN, Patrecia Pour, MD  heparin injection 5,000 Units, 5,000 Units, Subcutaneous, Q8H, [name], MD, 5,000 Units at 11/23/21 0603   ipratropium-albuterol (DUONEB) 0.5-2.5 (3) MG/3ML nebulizer solution 3 mL, 3 mL, Nebulization, Q6H PRN, [name], MD   lidocaine (LIDODERM) 5 % 1 patch, 1 patch, Transdermal, Q24H, [name], MD, 1 patch at 11/23/21 1200   loperamide (IMODIUM) capsule 2 mg, 2 mg, Oral, PRN, [name], MD, 2 mg at 11/22/21 1438   metoCLOPramide (REGLAN) tablet 10 mg, 10 mg, Oral, TID AC, Patrecia Pour, MD, 10 mg at 11/23/21 0602   metoprolol succinate (TOPROL-XL) 24 hr tablet 25 mg, 25 mg, Oral, Daily, [name], MD, 25 mg at 11/22/21 0905   montelukast (SINGULAIR) tablet 10 mg, 10 mg, Oral, QHS, [name], MD, 10 mg at 11/19/21 0010   ondansetron (ZOFRAN-ODT) disintegrating tablet 4 mg, 4 mg, Oral, Q8H PRN, Patrecia Pour, MD, 4 mg at 11/22/21 1705   Oral care mouth rinse, 15 mL, Mouth Rinse, PRN, [name], MD   oxyCODONE (Oxy IR/ROXICODONE) immediate release tablet  10-15 mg, 10-15 mg, Oral, Q4H PRN, Patrecia Pour, MD, 15 mg at 11/23/21 0602   pantoprazole (PROTONIX) EC tablet 40 mg, 40 mg, Oral, Daily, Adefeso, Olad[place], DO, 40 mg at 11/23/21 0602   polyethylene glycol (MIRALAX / GLYCOLAX) packet 17 g, 17 g, Oral, Daily PRN, [name], MD   thiamine (VITAMIN B1) tablet 100 mg, 100 mg, Oral, Daily, Patrecia Pour, MD, 100 mg at 11/22/21 2330   Patients Current Diet:  Diet Order                  Diet renal/carb modified with fluid restriction Diet-HS Snack? Nothing; Fluid restriction: 1200 mL Fluid; Room service appropriate? Yes; Fluid consistency: Thin  Diet effective now                       Precautions / Restrictions Precautions Precautions: Fall Precaution Comments: recent hip sx to L hip; left foot drop and bil hearing loss noted 11/7 Restrictions Weight Bearing Restrictions: No    Has the patient had 2 or more falls or a fall with injury in the past year? No   Prior Activity Level Community (5-7x/wk): Mod I with cane, drove self to OP HD   Prior Functional Level Self Care: Did the patient need help bathing, dressing, using the toilet or eating? Needed some help   Indoor Mobility: Did the patient need assistance with walking from room to room (with or without device)? Independent   Stairs: Did the patient need assistance with internal or external stairs (with or without device)? Independent   Functional Cognition: Did the patient need help planning regular tasks such as shopping or remembering to take medications? Independent   Patient Information Are you of Hispanic, Latino/a,or Spanish origin?: A. No, not of Hispanic, Latino/a, or Spanish origin What is your race?: B. Black or African American Do you need or want an interpreter to communicate with a doctor or health care staff?: 0. No   Patient's Response To:  Health Literacy and Transportation Is the patient able to respond to health literacy and transportation  needs?: Yes Health Literacy - How often do you need to have someone help you when you read instructions, pamphlets, or other written material from your doctor or pharmacy?: Never In the past 12 months, has lack of transportation kept you from medical appointments or from getting medications?: No  In the past 12 months, has lack of transportation kept you from meetings, work, or from getting things needed for daily living?: No   Development worker, international aid / [place]: Rollator (4 wheels), Cane - single point   Prior Device Use: Indicate devices/aids used by the patient prior to current illness, exacerbation or injury?  cane   Current Functional Level Cognition   Overall Cognitive Status: Within Functional Limits for tasks assessed Current Attention Level: Focused Orientation Level: Oriented X4 Following Commands: Follows one step commands with increased time, Follows one step commands inconsistently Safety/Judgement: Decreased awareness of safety, Decreased awareness of deficits General Comments: pt with questions about AIR vs SNF    Extremity Assessment (includes Sensation/Coordination)   Upper Extremity Assessment: Defer to OT evaluation RUE Deficits / Details: jerking movements, with inability to hold arm against gravity.  Not following MMT commands RUE Coordination: decreased fine motor, decreased gross motor LUE Deficits / Details: jerking movements, with inability to hold arm against gravity. LUE Coordination: decreased fine motor, decreased gross motor  Lower Extremity Assessment: LLE deficits/detail, RLE deficits/detail RLE Deficits / Details: grossly 3-/5 LLE Deficits / Details: 1/5 plantar flexion and 0/5 dorsiflexion, knee 2-/5, hip 2-/5; ROM hip, knee and ankle limited by pain     ADLs   Overall ADL's : Needs assistance/impaired Eating/Feeding: Set up, Sitting Eating/Feeding Details (indicate cue type and reason): in recliner Grooming: Wash/dry hands, Wash/dry  face, Maximal assistance, Bed level Upper Body Bathing: Maximal assistance, Bed level Lower Body Bathing: Total assistance, Bed level Upper Body Dressing : Maximal assistance, Sitting Lower Body Dressing: Maximal assistance Lower Body Dressing Details (indicate cue type and reason): unable to donn socks on RLE, did not want sock on LLE Toilet Transfer: Maximal assistance, BSC/3in1, Squat-pivot Toileting- Clothing Manipulation and Hygiene: Total assistance, Bed level General ADL Comments: limited by pain     Mobility   Overal bed mobility: Needs Assistance Bed Mobility: Sit to Supine Rolling: Min assist Supine to sit: HOB elevated, Min guard Sit to supine: Min guard General bed mobility comments: min guard for safety     Transfers   Overall transfer level: Needs assistance Equipment used: None Transfers: Bed to chair/wheelchair/BSC Sit to Stand: Mod assist, From elevated surface Bed to/from chair/wheelchair/BSC transfer type:: Step pivot Squat pivot transfers: Min assist Step pivot transfers: Mod assist Transfer via Lift Equipment: [employer] transfer comment: pt with L LE on bed pad as she can not tolerate sock, attempted x2 to come to stand with therapist on R side, unable to come to fully upright, PT positioned in front of pt and able to use gait belt to provide power up with cuing for increased forward lean, with R foot resting on bed pad pt able to perform hopping pivot to chair     Ambulation / Gait / Stairs / Wheelchair Mobility   Ambulation/Gait General Gait Details: unable to tolerate weightbearing through L foot     Posture / Balance Dynamic Sitting Balance Sitting balance - Comments: Can sit EOB on her own. Balance Overall balance assessment: Needs assistance Sitting-balance support: Feet supported, No upper extremity supported Sitting balance-Leahy Scale: Fair Sitting balance - Comments: Can sit EOB on her own. Postural control: Posterior lean Standing balance  support: Bilateral upper extremity supported, During functional activity Standing balance-Leahy Scale: Poor Standing balance comment: able to come to fully upright but requires outside physical assist to steady     Special needs/care consideration ESRD on OP Hemodialysis at [facility] Drove self  Previous Home Environment  Living Arrangements: Other (Comment) (Lived alone until hip surgery, now with sister)  Lives With: Family Available Help at Discharge: Family, Available 24 hours/day Type of Home: House Home Layout: One level Home Access: Level entry Bathroom Shower/Tub: Chiropodist: Standard Bathroom Accessibility: Yes How Accessible: Accessible via walker [place]: No Additional Comments: Patient wil be stayig at sister's home initially.  Above information is sister.   Discharge Living Setting Plans for Discharge Living Setting: Lives with (comment) (sister) Type of Home at Discharge: House Discharge Home Layout: One level Discharge Home Access: Level entry Discharge Bathroom Shower/Tub: Tub/shower unit Discharge Bathroom Toilet: Standard Discharge Bathroom Accessibility: Yes How Accessible: Accessible via walker Does the patient have any problems obtaining your medications?: No   Social/Family/Support Systems Patient Roles: Parent Contact Information: sister, Leandro Reasoner Anticipated Caregiver: sister and other family members Anticipated Caregiver's Contact Information: see contacts Ability/Limitations of Caregiver: no limitations Caregiver Availability: 24/7 Discharge Plan Discussed with Primary Caregiver: Yes Is Caregiver In Agreement with Plan?: Yes Does Caregiver/Family have Issues with Lodging/Transportation while Pt is in Rehab?: No   Goals Patient/Family Goal for Rehab: Mod I to supervision with PT and OT Expected length of stay: ELOS 10 to 12 days Additional Information: OP hemodialysis Pt/Family Agrees to Admission and willing to  participate: Yes Program Orientation Provided & Reviewed with Pt/Caregiver Including Roles  & Responsibilities: Yes   Decrease burden of Care through IP rehab admission: n/a   Possible need for SNF placement upon discharge: not anticipated Patient Condition: I have reviewed medical records from [facility], spoken with CM, and patient and daughter. I met with patient at the bedside for inpatient rehabilitation assessment.  Patient will benefit from ongoing PT and OT, can actively participate in 3 hours of therapy a day 5 days of the week, and can make measurable gains during the admission.  Patient will also benefit from the coordinated team approach during an Inpatient Acute Rehabilitation admission.  The patient will receive intensive therapy as well as Rehabilitation physician, nursing, social worker, and care management interventions.  Due to bladder management, bowel management, safety, skin/wound care, disease management, medication administration, pain management, and patient education the patient requires 24 hour a day rehabilitation nursing.  The patient is currently mod assist overall with mobility and basic ADLs.  Discharge setting and therapy post discharge at home with home health is anticipated.  Patient has agreed to participate in the Acute Inpatient Rehabilitation Program and will admit today.   Preadmission Screen Completed By:  Cleatrice Burke, 11/23/2021 12:56 PM ______________________________________________________________________   Discussed status with Dr. Naaman Plummer on 11/23/21 at 1300 and received approval for admission today.   Admission Coordinator:  Cleatrice Burke, RN, time 1300 Date 11/23/21    Assessment/Plan: Diagnosis: Debility Does the need for close, 24 hr/day Medical supervision in concert with the patient's rehab needs make it unreasonable for this patient to be served in a less intensive setting? Yes Co-Morbidities requiring  supervision/potential complications: ESRD on HD, CAD, HTN, CHF, DM Due to bladder management, bowel management, safety, skin/wound care, disease management, medication administration, pain management, and patient education, does the patient require 24 hr/day rehab nursing? Yes Does the patient require coordinated care of a physician, rehab nurse, PT, OT to address physical and functional deficits in the context of the above medical diagnosis(es)? Yes Addressing deficits in the following areas: balance, endurance, locomotion, strength, transferring, bowel/bladder control, bathing, dressing, feeding, grooming, toileting, and psychosocial support Can  the patient actively participate in an intensive therapy program of at least 3 hrs of therapy 5 days a week? Yes The potential for patient to make measurable gains while on inpatient rehab is excellent Anticipated functional outcomes upon discharge from inpatient rehab: modified independent and supervision PT, modified independent and supervision OT, n/a SLP Estimated rehab length of stay to reach the above functional goals is: 10-12 days Anticipated discharge destination: Home 10. Overall Rehab/Functional Prognosis: excellent     MD Signature: Meredith Staggers, MD, [place] Director Rehabilitation Services 11/23/2021          Revision History

## 2021-11-23 NOTE — Discharge Summary (Signed)
Physician Discharge Summary   Patient: Susan Horton MRN: [id number] DOB: [birth year]  Admit date:     11/08/2021  Discharge date: 11/23/21  Discharge Physician: Patrecia Pour   PCP: Charlott Rakes, MD   Recommendations at discharge:  Follow up with vascular surgery Continue routine HD MWF thru [facility] AVF Monitor leukocytosis intermittently. If persistent, consider hematology evaluation.  Monitor sensorineural hearing loss and further audiology evaluation/management as indicated (see note 11/7).  Discharge Diagnoses: Principal Problem:   Sepsis ([place]) Active Problems:   Pneumonia of right lung due to infectious organism   ESRD (end stage renal disease) ([place])   Altered mental status   Cellulitis   Anemia due to chronic kidney disease, on chronic dialysis ([place])   Acute metabolic encephalopathy   Severe sepsis ([facility])   Pressure injury of skin   Sensorineural hearing loss (SNHL) of both ears  Hospital Course: Susan Horton is a 51 year old woman, former smoker, with end-stage renal disease on HD, CAD, hypertension with systolic and diastolic CHF, diabetes complicated by peripheral neuropathy and gastroparesis, lower extremity DVT, PVD, and asthma.  She underwent recent excision of tumoral calcinosis left thigh, drain and sutures removed on 10/20/2021 without any evidence of infection per office notes.    She was brought to the emergency department 10/29 with 3 days of progressive fatigue, weakness.  She apparently suffered a mechanical fall on 10/27 with resultant R hip pain.  In the ED noted to be febrile, lethargic/confused, tachycardic. She was treated for septic shock with empiric antibiotics, with source possibly cellulitis around operative site on left thigh. Also had biventricular heart failure for which cardiology was consulted. With antibiotics, symptoms improved dramatically. She required pressor support initially. Stay has been complicated by left foot pain and left foot drop, found  to have vascular disease of LLE requiring angiography and intervention as detailed below. She has shown stable mentation and vital signs for many days, though continued to have leukocytosis. This remains stable, slightly improved on 11/13. It is not associated with a fever or any symptoms or signs of infection. Given her stability, case discussed with PM&R who are comfortable admitting her to CIR at this time not requiring any further work up or antibiotics.   Assessment and Plan: Acute metabolic encephalopathy: Unclear exact precipitant but has resolved. ?if related to sepsis or medications or uremia.  - Monitor   Abrupt onset bilateral sensorineural hearing loss: Confirmed by audiology 11/7, though symptom has actually resolved. Visualization of canal essentially clear-- no bulging of TM. No acute abnormality on head CT. - Avoid ototoxins, follow up with audiology after discharge.   Left foot pain: Negative XR, Uric acid wnl. Slightly improving ?ischemic pain now reperfusing. - Continue lidoderm, continue oxycodone.   PAD: s/p remote left EIA, SFA stent, known stenosis with no toe pressure bilaterally, noncompressible R ABI. In the setting of shock and pressor requirement, left SFA stent occlusion was suspected to be cause of left foot pain. Underwent left common iliac artery atherectomy and and stent, DCB of the left common femoral artery and stent of the left SFA in the setting of multilevel high-grade stenosis this admission on 11/6.  - Continue medications as ordered.    Left foot drop: Nonacute head CT.  - AFO recommended    Septic shock: Shock resolved with antibiotics empirically. Initially concerned for possible bacteremia in patient with vascular access, on HD. Cultures done after abx were NGTD.  - CT Hip with some soft tissue edema which  might reflect mild cellulitis. Wound draining but no exudate, culture negative. Wound continues to appear uninfected. Orthopedics consulted for 2nd  opinion, they concur. - Completed broad abx 10/29 - 11/6.   Leukocytosis: WBC remains 20k in the absence of fever or nidus of infection. She doesn't have signs or symptoms of VTE. WBC was 12.8 on admission, last values before that were all normal, but date most recently to June 2022.  - The patient just looks so well. There's certainly no symptoms or signs of infection at this time. We kept her as inpatient over the weekend to continue monitoring. Nothing has developed. Doubt BM biopsy would be very useful in this still acute environment. Discussed with PM&R. My largest suspicion is that this is reactive to vascular stenoses (albeit no critical ischemia). We will discharge to CIR at this time where she will continue to be monitored.  - Check blood cultures if she develops a fever and suggest hematology consult/referral if persists at time of discharge.   Biventricular heart failure, severe TR (new), pulmonary HTN:  - Cardiology signed off, suspecting the PA pressure elevation led to worsening of previously moderate TR. No further work up currently recommended.    CAD s/p mLAD PCI w DES (2018), HTN, HLD: LVEF 45-50%, LV global hypokinesis. RV moderately reduced systolic fxn, moderately enlarged RV size, mildly elevated PASP. RVSP 43. - Note decreased dose of metoprolol, recommend holding imdur/hydralazine and norvasc as this would lower BP too much - Restart praluent after discharge - DAPT w/ASA, plavix. - Volume management per HD   ESRD: Initially required CRRT. Temp cath removed, no signs of infection. - Continue HD per nephrology MWF on schedule thru L[facility] AVF.   Hyperkalemia: Resolved with HD.   Hyperphosphatemia : Per nephrology Pt has consistently declined lanthanum because it causes emesis.   Uremia: Resolved with HD   Anemia of ESRD: Ferritin 5k, TIBC low, Z61, folic acid wnl.  - ESA per nephrology. Continues to have no bleeding or symptoms attributable to anemia. - Monitor and  transfuse prn hgb < 7g/dl. Recheck at next HD.   Asthma without exacerbation  - Continue breo ellipta, singulair, prn BD.   T2DM with diabetic neuropathy/gastroparesis: HbA1c 5%, CBGs controlled without insulin, will DC CBGs.  - Glycemic control is normal without any interventions, so no further CBG checks are currently necessary.  Gastroparesis, GERD:  - Restart reglan, PPI, carafate   Tumoral calcinosis: Longstanding issue, not responsive to sodium thiopurine as outpatient. Excision from left thigh performed at [facility] 9/29. Cx 11/2 NGTD. - Continue wound care per orders.  - Will need to follow up with WF surgery (was planning to follow up 6 weeks postop which is 11/10).   Consultants: PCCM, nephrology, cardiology, vascular surgery, orthopedics, PM&R Procedures performed: Right IJ CVC 10/29. Hemodialysis Disposition:  Inpatient rehabilitation Diet recommendation: Cardiac, renal DISCHARGE MEDICATION: Allergies as of 11/23/2021       Reactions   Repatha [evolocumab] Itching   Lisinopril Cough        Medication List     STOP taking these medications    acetaminophen-codeine 300-30 MG tablet Commonly known as: TYLENOL #3   amLODipine 10 MG tablet Commonly known as: NORVASC   calcium acetate 667 MG capsule Commonly known as: PHOSLO   gabapentin 300 MG capsule Commonly known as: NEURONTIN   hydrALAZINE 50 MG tablet Commonly known as: APRESOLINE   HYDROcodone-acetaminophen 10-325 MG tablet Commonly known as: Norco   isosorbide mononitrate 30 MG 24 hr tablet  Commonly known as: IMDUR   lanthanum 1000 MG chewable tablet Commonly known as: FOSRENOL   oxyCODONE-acetaminophen 5-325 MG tablet Commonly known as: PERCOCET/ROXICET   PARoxetine 20 MG tablet Commonly known as: Paxil       TAKE these medications    aspirin 81 MG chewable tablet Chew 1 tablet (81 mg total) by mouth daily.   atorvastatin 40 MG tablet Commonly known as: LIPITOR TAKE 1 TABLET BY  MOUTH EVERY DAY   calcitRIOL 0.5 MCG capsule Commonly known as: ROCALTROL Take 1.5 mcg by mouth daily.   clopidogrel 75 MG tablet Commonly known as: PLAVIX TAKE 1 TABLET (75 MG TOTAL) BY MOUTH DAILY WITH BREAKFAST.   cyclobenzaprine 10 MG tablet Commonly known as: FLEXERIL Take 1 tablet (10 mg total) by mouth 2 (two) times daily as needed.   Dialyvite/Zinc Tabs Take 1 tablet by mouth daily.   fluticasone furoate-vilanterol 200-25 MCG/ACT Aepb Commonly known as: Breo Ellipta Inhale 1 puff into the lungs daily.   ipratropium-albuterol 0.5-2.5 (3) MG/3ML Soln Commonly known as: DUONEB Take 3 mLs by nebulization every 6 (six) hours as needed.   iron sucrose 20 MG/ML injection Commonly known as: VENOFER Iron Sucrose (Venofer)   metoCLOPramide 10 MG tablet Commonly known as: REGLAN Take 1 tablet (10 mg total) by mouth 3 (three) times daily before meals.   metoprolol succinate 25 MG 24 hr tablet Commonly known as: TOPROL-XL Take 1 tablet (25 mg total) by mouth daily. TAKE 1.5 TABLETS BY MOUTH DAILY What changed:  medication strength how much to take how to take this when to take this   MIRCERA IJ Inject into the skin.   Misc. Engineer, technical sales scooter for mobility   montelukast 10 MG tablet Commonly known as: SINGULAIR Take 1 tablet (10 mg total) by mouth at bedtime.   nitroGLYCERIN 0.4 MG SL tablet Commonly known as: Nitrostat Place 1 tablet (0.4 mg total) under the tongue every 5 (five) minutes as needed for chest pain.   ondansetron 4 MG disintegrating tablet Commonly known as: Zofran ODT Take 1 tablet (4 mg total) by mouth every 8 (eight) hours as needed for nausea or vomiting.   Oxycodone HCl 10 MG Tabs Take 1-1.5 tablets (10-15 mg total) by mouth every 4 (four) hours as needed for moderate pain or severe pain.   pantoprazole 40 MG tablet Commonly known as: PROTONIX Take 1 tablet (40 mg total) by mouth daily.   Praluent 75 MG/ML Soaj Generic drug:  Alirocumab INJECT 75 MG INTO THE SKIN EVERY 14 (FOURTEEN) DAYS.   promethazine 25 MG tablet Commonly known as: PHENERGAN Take 1 tablet (25 mg total) by mouth every 8 (eight) hours as needed for nausea or vomiting.   sucralfate 1 GM/10ML suspension Commonly known as: Carafate Take 10 mLs (1 g total) by mouth 4 (four) times daily -  with meals and at bedtime.   Ventolin HFA 108 (90 Base) MCG/ACT inhaler Generic drug: albuterol Inhale 1-2 puffs into the lungs every 6 (six) hours as needed for wheezing or shortness of breath.        Follow-up Information     [name], MD Follow up in 5 week(s).   Specialty: Vascular Surgery Why: sent Contact information: [address] [place] [address] [phone]                Discharge Exam: [place] Weights   11/21/21 0545 11/22/21 0629 11/23/21 0500  Weight: 63.5 kg 63.7 kg 63.7 kg  BP (!) 116/54 (  BP Location: Right Arm)   Pulse 86   Temp 97.7 F (36.5 C) (Oral)   Resp 18   Wt 63.7 kg   SpO2 97%   BMI 23.37 kg/m   Seen EOB with PT who says she's doing great. Pt in high spirits, eager to continue rehabilitation. No fever, chills, myalgias, chest pain, dyspnea, cough, palpitations, abdominal pain. Has some nausea which is baseline for her, vomiting better after restarting reglan. Having 1-2 stools per day, loose but not watery, no blood/normal color, no pain with that. No new rashes, no left thigh/hip pain. Left foot pain is overall improving, constant. No neck pain or stiffness or headache.   No distress, nontoxic, interactive, pleasant RRR, no MRG, JVD or pitting edema Clear, nonlabored. Left thigh surgical site incompletely healed with thin drainage continuing. No odor, exudate or bleeding. No surrounding erythema.  Left foot with dopplerable pulse, warm, dry, no edema or erythema.   Condition at discharge: stable  The results of significant diagnostics from this hospitalization (including imaging,  microbiology, ancillary and laboratory) are listed below for reference.   Imaging Studies: CT HEAD WO CONTRAST (5MM)  Result Date: 11/19/2021 CLINICAL DATA:  51 year old female with weakness, acute neurologic deficit. EXAM: CT HEAD WITHOUT CONTRAST TECHNIQUE: Contiguous axial images were obtained from the base of the skull through the vertex without intravenous contrast. RADIATION DOSE REDUCTION: This exam was performed according to the departmental dose-optimization program which includes automated exposure control, adjustment of the mA and/or kV according to patient size and/or use of iterative reconstruction technique. COMPARISON:  Brain MRI 01/13/2019.  Head CT 11/08/2021. FINDINGS: Brain: Cerebral volume remains normal for age. No midline shift, ventriculomegaly, mass effect, evidence of mass lesion, intracranial hemorrhage or evidence of cortically based acute infarction. Gray-white matter differentiation remains largely normal for age. Subtle chronic lacunar infarct of the posterior left corona radiata, and heterogeneity in the brainstem, better demonstrated on MRI. Vascular: Calcified atherosclerosis at the skull base. No suspicious intracranial vascular hyperdensity. Skull: No acute osseous abnormality identified. Sinuses/Orbits: Visualized paranasal sinuses and mastoids are stable and well aerated. Other: Calcified scalp vessel atherosclerosis. Leftward gaze. No other orbit or scalp soft tissue finding. IMPRESSION: 1. No acute intracranial abnormality and stable non contrast CT appearance of the brain since last month. Mild chronic small vessel disease better demonstrated by MRI. 2. Calcified scalp vessel atherosclerosis, frequently observed in end stage renal disease. Electronically Signed   By: Genevie Ann M.D.   On: 11/19/2021 08:13   PERIPHERAL VASCULAR CATHETERIZATION  Result Date: 11/16/2021 Images from the original result were not included. Patient name: Susan Horton MRN: [id number] DOB:  06/24/1970 Sex: female 11/16/2021 Pre-operative Diagnosis: Chonic limb threatening left lower extremity ischemia with rest pain Post-operative diagnosis:  Same Surgeon:  Eda Paschal. [name], MD Procedure Performed: 1.  Also guided cannulation right common femoral artery 2.  Stent of right common iliac artery 7 x 29 mm VBX 3.  Aortogram with left lower extremity angiogram 4.  Stent of left common iliac artery with 7 x 59 mm VBX 5.  Jetstream atherectomy left common femoral artery and SFA 6.  Drug-coated balloon angioplasty left common femoral artery with 6 x 40 mm Ranger 7.  Stent of left SFA with proximal 5 x 140 mm [place]ilver and distal 5 x 80 mm [place]ilver 8. Moderate sedation with fentanyl and Versed for 101 minutes Indications: 51 year old female with history of left external iliac artery and left SFA stenting for short distance claudication.  She has now been admitted with left hip infection and has severe rest pain in the left lower extremity with concern for occlusion of her stents and is indicated for angiography with possible intervention. Findings: The aorta and iliac segments were heavily calcified.  I could actually not pass the right common iliac artery without stenting first.  Aortogram demonstrated a calcified artery.  Left common femoral artery was subtotally occluded at least 95% stenosed and there was a 90% stenosis in the proximal SFA.  The profunda was patent.  The existing SFA stent existing external iliac artery stents were patent.  The left SFA distal to the stents was diseased with subtotal occlusion at least 95% stenosis.  There was single-vessel runoff via the posterior tibial artery which did fill the foot.  After atherectomy and stenting and drug-coated balloon angioplasty there was brisk flow through the common femoral artery and SFA down to the posterior tibial artery.  There was still common iliac artery lesion on the left this was primarily stented and the distal pressure did improve 40  mmHg at the common femoral artery after stenting.  There was a very strong posterior tibial signal at completion.  Procedure:  The patient was identified in the holding area and taken to room 8.  The patient was then placed supine on the table and prepped and draped in the usual sterile fashion.  A time out was called.  Ultrasound was used to evaluate the right common femoral artery which was heavily diseased.  The area was anesthetized 1% lidocaine cannulated by puncture needle followed by wire and sheath.  Ultrasound guidance was used and images saved the permanent record.  Concomitantly we administered fentanyl and Versed and her vital signs were monitored throughout the case.  A Bentson wire was placed followed by 5 [place] sheath.  Of Berenstein catheter was used to cross the high-grade stenosis in the common iliac artery on the right and this was primarily stented with a 7 x 29 mm VBX.  Aortogram was then performed.  I did cross the bifurcation using Glidewire advantage and crossover catheter and then placed a long 7 French sheath over the bifurcation had to perform a left lower extremity angiography and the patient was fully heparinized prior to placing a 7 x 29 mm VBX.  We then were able to cross all of the occluded common femoral artery and stenosis in the SFA all the way down to the popliteal artery perform distal angiogram which demonstrated runoff via the posterior tibial to the foot.  I placed an 014 wire followed by a Nava 6 filter.  We then performed jetstream atherectomy the common femoral artery and proximal SFA followed by balloon angioplasty of the common femoral artery with 5 and 6 mm balloons followed by drug-coated balloon angioplasty of the common femoral artery.  The filter was then removed and I exchanged for a separate 014 wire that was placed distally.  We then primarily stented the distal SFA with a 5 x 80 mm drug-eluting stent followed by 5 x 140 mm drug-eluting stent proximally all  ballooned with 5 mm balloon.  Completion demonstrated brisk runoff via the SFA and the common femoral was patent.  At that time we had a systolic pressure of 75 and the sheath at the common femoral artery.  I elected to primarily stent the common iliac artery and this was primarily stented with a 7 x 59 mm VBX without incidence.  Completion demonstrated an improvement in systolic pressure at  the common femoral artery to 115 with only a 15 mm gradient from the aorta at this time.  We then exchanged over the Amplatz wire for a short 7 French sheath and removed the Amplatz with quick cross catheter.  Retrograde imaging was performed of the right common femoral artery there was no active extravasation this will be pulled postoperative holding.  Patient tolerated procedure without any complication. Contrast: 130cc Brandon C. [name], MD [facility] Office: [phone] Pager: [phone]   DG Ankle 2 Views Left  Result Date: 11/13/2021 CLINICAL DATA:  Pain EXAM: LEFT ANKLE - 2 VIEW COMPARISON:  None Available. FINDINGS: No fracture or dislocation is seen. No focal lytic lesions are seen. Extensive arterial calcifications are seen in soft tissues. IMPRESSION: No fracture or dislocation is seen.  Arteriosclerosis. Electronically Signed   By: Elmer Picker M.D.   On: 11/13/2021 18:39   ECHOCARDIOGRAM COMPLETE  Result Date: 11/09/2021    ECHOCARDIOGRAM REPORT   Patient Name:   Susan Horton Date of Exam: 11/09/2021 Medical Rec #:  [id number]     Height:       65.0 in Accession #:    [id number]    Weight:       132.0 lb Date of Birth:  Oct 29, 1970      BSA:          1.658 m Patient Age:    14 years      BP:           143/53 mmHg Patient Gender: F             HR:           106 bpm. Exam Location:  Inpatient Procedure: 2D Echo, Cardiac Doppler and Color Doppler Indications:    Endocarditis  History:        Patient has prior history of Echocardiogram examinations, most                  recent 07/02/2019. CAD; Risk Factors:Hypertension and Diabetes.  Sonographer:    Memory Argue Referring Phys: [place]  Sonographer Comments: Suboptimal subcostal window. IMPRESSIONS  1. Left ventricular ejection fraction, by estimation, is 45 to 50%. The left ventricle has mildly decreased function. The left ventricle demonstrates global hypokinesis. There is mild left ventricular hypertrophy. Left ventricular diastolic parameters are indeterminate.  2. Right ventricular systolic function is moderately reduced. The right ventricular size is moderately enlarged. There is mildly elevated pulmonary artery systolic pressure. The estimated right ventricular systolic pressure is 19.6 mmHg.  3. Tricuspid valve regurgitation is severe.  4. Left atrial size was mild to moderately dilated.  5. The mitral valve is degenerative. Trivial mitral valve regurgitation. No evidence of mitral stenosis. Severe mitral annular calcification.  6. The aortic valve is abnormal, tricuspid with moderately reduced cusp excursion. There is moderate calcification of the aortic valve. Aortic valve regurgitation is not visualized. Aortic valve sclerosis/calcification is present, without any evidence of aortic stenosis. Aortic valve mean gradient measures 7.0 mmHg. Stroke volume index is low, 30 mL/m2, suggesting aortic valve gradients may be underestimated.  7. The inferior vena cava is dilated in size with <50% respiratory variability, suggesting right atrial pressure of 15 mmHg. Conclusion(s)/Recommendation(s): No evidence of valvular vegetations on this transthoracic echocardiogram. Consider a transesophageal echocardiogram to exclude infective endocarditis if clinically indicated. FINDINGS  Left Ventricle: Left ventricular ejection fraction, by estimation, is 45 to 50%. The left ventricle has mildly decreased function. The left  ventricle demonstrates global hypokinesis. The left ventricular internal cavity size was normal in size.  There is  mild left ventricular hypertrophy. Left ventricular diastolic parameters are indeterminate. Right Ventricle: The right ventricular size is moderately enlarged. No increase in right ventricular wall thickness. Right ventricular systolic function is moderately reduced. There is mildly elevated pulmonary artery systolic pressure. The tricuspid regurgitant velocity is 2.66 m/s, and with an assumed right atrial pressure of 15 mmHg, the estimated right ventricular systolic pressure is 71.2 mmHg. Left Atrium: Left atrial size was mild to moderately dilated. Right Atrium: Right atrial size was normal in size. Pericardium: There is no evidence of pericardial effusion. Mitral Valve: The mitral valve is degenerative in appearance. There is moderate calcification of the mitral valve leaflet(s). Severe mitral annular calcification. Trivial mitral valve regurgitation. No evidence of mitral valve stenosis. Tricuspid Valve: The tricuspid valve is normal in structure. Tricuspid valve regurgitation is severe. No evidence of tricuspid stenosis. Aortic Valve: The aortic valve is abnormal. There is moderate calcification of the aortic valve. Aortic valve regurgitation is not visualized. Aortic valve sclerosis/calcification is present, without any evidence of aortic stenosis. Aortic valve mean gradient measures 7.0 mmHg. Aortic valve peak gradient measures 13.0 mmHg. Aortic valve area, by VTI measures 1.54 cm. Pulmonic Valve: The pulmonic valve was not well visualized. Pulmonic valve regurgitation is not visualized. No evidence of pulmonic stenosis. Aorta: The aortic root is normal in size and structure. Venous: The inferior vena cava is dilated in size with less than 50% respiratory variability, suggesting right atrial pressure of 15 mmHg. IAS/Shunts: The interatrial septum was not well visualized.  LEFT VENTRICLE PLAX 2D LVIDd:         4.20 cm      Diastology LVIDs:         3.30 cm      LV e' medial:    7.62 cm/s LV PW:          1.00 cm      LV E/e' medial:  18.4 LV IVS:        1.00 cm      LV e' lateral:   16.20 cm/s LVOT diam:     2.00 cm      LV E/e' lateral: 8.6 LV SV:         49 LV SV Index:   30 LVOT Area:     3.14 cm  LV Volumes (MOD) LV vol d, MOD A2C: 148.0 ml LV vol d, MOD A4C: 118.0 ml LV vol s, MOD A2C: 76.1 ml LV vol s, MOD A4C: 63.9 ml LV SV MOD A2C:     71.9 ml LV SV MOD A4C:     118.0 ml LV SV MOD BP:      61.9 ml RIGHT VENTRICLE TAPSE (M-mode): 1.8 cm LEFT ATRIUM             Index        RIGHT ATRIUM           Index LA diam:        4.30 cm 2.59 cm/m   RA Area:     15.50 cm LA Vol (A2C):   73.9 ml 44.57 ml/m  RA Volume:   37.95 ml  22.89 ml/m LA Vol (A4C):   60.2 ml 36.31 ml/m LA Biplane Vol: 67.3 ml 40.59 ml/m  AORTIC VALVE AV Area (Vmax):    1.58 cm AV Area (Vmean):   1.49 cm AV Area (VTI):     1.54 cm  AV Vmax:           180.00 cm/s AV Vmean:          124.000 cm/s AV VTI:            0.321 m AV Peak Grad:      13.0 mmHg AV Mean Grad:      7.0 mmHg LVOT Vmax:         90.30 cm/s LVOT Vmean:        58.700 cm/s LVOT VTI:          0.157 m LVOT/AV VTI ratio: 0.49  AORTA Ao Root diam: 3.40 cm Ao Asc diam:  2.90 cm MITRAL VALVE                TRICUSPID VALVE MV Area (PHT): 4.96 cm     TR Peak grad:   28.3 mmHg MV Decel Time: 153 msec     TR Vmax:        266.00 cm/s MV E velocity: 140.00 cm/s MV A velocity: 134.00 cm/s  SHUNTS MV E/A ratio:  1.04         Systemic VTI:  0.16 m                             Systemic Diam: 2.00 cm Cherlynn Kaiser MD Electronically signed by Cherlynn Kaiser MD Signature Date/Time: 11/09/2021/3:14:03 PM    Final    CT Angio Abd/Pel w/ and/or w/o  Result Date: 11/09/2021 CLINICAL DATA:  51 year old female with concern for retroperitoneal hemorrhage. EXAM: CT ANGIOGRAPHY ABDOMEN AND PELVIS WITH CONTRAST AND WITHOUT CONTRAST TECHNIQUE: Multidetector CT imaging of the abdomen and pelvis was performed using the standard protocol during bolus administration of intravenous contrast. Multiplanar  reconstructed images and MIPs were obtained and reviewed to evaluate the vascular anatomy. RADIATION DOSE REDUCTION: This exam was performed according to the departmental dose-optimization program which includes automated exposure control, adjustment of the mA and/or kV according to patient size and/or use of iterative reconstruction technique. CONTRAST:  157m OMNIPAQUE IOHEXOL 350 MG/ML SOLN COMPARISON:  CT abdomen pelvis from 06/29/2020 FINDINGS: VASCULAR Aorta: Normal caliber and patent throughout. Diffuse circumferential atherosclerotic calcifications, most prominent in the infrarenal portion. No evidence of definite flow limiting stenosis. Celiac: Mild ostial stenosis secondary to atherosclerotic plaque. Patent distally. SMA: Severe focal ostial stenosis secondary to atherosclerotic plaque. Patent distally. Renals: Single bilateral renal arteries with moderate ostial stenosis bilaterally secondary to atherosclerotic plaques. Patent distally with severe atherosclerotic calcifications throughout the visualized renal arteries. IMA: Occluded proximally with distal reconstitution via arc of [place]. Inflow: Severe diffuse circumferential atherosclerotic calcifications bilaterally without multifocal at least mild stenoses. The indwelling left external iliac artery stent appears patent. Proximal Outflow: Bilateral common femoral and visualized portions of the superficial and profunda femoral arteries are patent without evidence of aneurysm, dissection, vasculitis or significant stenosis. Veins: The hepatic veins are not well opacified due to contrast phase. The portal system is widely patent and normal in caliber. The renal veins are patent bilaterally in standard anatomic configuration. No evidence of iliocaval thrombosis or anomaly. Review of the MIP images confirms the above findings. NON-VASCULAR Lower chest: Similar appearing moderate global cardiomegaly. Hepatobiliary: No focal liver abnormality is seen. Status  post cholecystectomy. No biliary dilatation. Pancreas: Unremarkable. No pancreatic ductal dilatation or surrounding inflammatory changes. Spleen: Normal in size without focal abnormality. Adrenals/Urinary Tract: Adrenal glands are unremarkable. Mild diffuse perinephric fat stranding bilaterally, similar to 2022 comparison.  No hydronephrosis. No focal masses. Mild diffuse bladder wall thickening. Bladder is unremarkable. Stomach/Bowel: Stomach is within normal limits. Appendix is not definitively identified. No evidence of bowel wall thickening, distention, or inflammatory changes. Lymphatic: No abdominopelvic lymphadenopathy. Reproductive: Status post hysterectomy. No adnexal masses. Other: Small volume pelvic ascites.  No abdominal wall hernia. Musculoskeletal: Partially visualized intramuscular and subcutaneous calcifications about the left hip, better characterized on dedicated left hip CT from the same day. No evidence of acute osseous abnormality. IMPRESSION: VASCULAR 1. No evidence of acute vascular abnormality including retroperitoneal hemorrhage, as queried. 2. 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 Similar findings could be seen with chronic mesenteric ischemia. 3. Patent indwelling left external iliac artery stent. NON-VASCULAR 1. Diffuse perinephric fat stranding. As these findings could be a sign of pyelonephritis, there stable since comparison from 2022. 2. Small volume pelvic ascites. 3. Similar appearing, partially visualized left thigh tumor calcinosis, better characterized on dedicated left hip CT from the same day. Ruthann Cancer, MD [facility] Electronically Signed   By: Ruthann Cancer M.D.   On: 11/09/2021 10:46   CT HIP LEFT W CONTRAST  Result Date:  11/09/2021 CLINICAL DATA:  Left hip infection.  Status post surgery. EXAM: CT OF THE LOWER LEFT EXTREMITY WITH CONTRAST TECHNIQUE: Multidetector CT imaging of the lower left extremity was performed according to the standard protocol following intravenous contrast administration. RADIATION DOSE REDUCTION: This exam was performed according to the departmental dose-optimization program which includes automated exposure control, adjustment of the mA and/or kV according to patient size and/or use of iterative reconstruction technique. CONTRAST:  164m OMNIPAQUE IOHEXOL 350 MG/ML SOLN COMPARISON:  MR left hip 08/27/2020 FINDINGS: Bones/Joint/Cartilage No fracture or dislocation. Normal alignment. No joint effusion. Ligaments Ligaments are suboptimally evaluated by CT. 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 Soft tissue No fluid collection or hematoma. No soft tissue mass. Soft tissue edema in the subcutaneous fat along the proximal lateral hip and thigh which may reflect mild cellulitis. Diffuse arterial atherosclerosis. Small amount of pelvic free fluid. IMPRESSION: 1. 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 2. Soft tissue edema in the subcutaneous fat along the proximal lateral hip and thigh which may reflect mild cellulitis. Electronically Signed   By: HKathreen DevoidM.D.   On: 11/09/2021 10:45   DG Chest Portable 1 View  Result Date: 11/08/2021 CLINICAL DATA:  Central line placement. EXAM: PORTABLE CHEST 1 VIEW COMPARISON:  Earlier same day FINDINGS: 1251 hours. Low volume film. The cardio pericardial silhouette is enlarged. Diffuse  bilateral hazy airspace opacity suggests edema. Right IJ central line tip overlies the upper right atrium. No evidence for pneumothorax. Telemetry leads overlie the chest. IMPRESSION: Right IJ central line tip overlies the upper right atrium. No evidence for pneumothorax. Cardiomegaly with pulmonary edema. Electronically Signed   By: EMisty StanleyM.D.   On: 11/08/2021 13:13   CT Head Wo Contrast  Result Date: 11/08/2021 CLINICAL DATA:  Head trauma.  Abnormal mental status. EXAM: CT HEAD WITHOUT CONTRAST TECHNIQUE: Contiguous axial images were obtained from the base of  the skull through the vertex without intravenous contrast. RADIATION DOSE REDUCTION: This exam was performed according to the departmental dose-optimization program which includes automated exposure control, adjustment of the mA and/or kV according to patient size and/or use of iterative reconstruction technique. COMPARISON:  None Available. FINDINGS: Brain: No evidence of acute infarction, hemorrhage, hydrocephalus, extra-axial collection or mass lesion/mass effect. Vascular: No hyperdense vessel or unexpected calcification. Skull: Normal. Negative for fracture or focal lesion. Sinuses/Orbits: No acute finding. Other: None. IMPRESSION: No acute intracranial abnormalities. Electronically Signed   By: Dorise Bullion III M.D.   On: 11/08/2021 11:54   DG Hip Unilat W or Wo Pelvis 2-3 Views Left  Result Date: 11/08/2021 CLINICAL DATA:  Fall/recent surgery. EXAM: DG HIP (WITH OR WITHOUT PELVIS) 2-3V LEFT COMPARISON:  July 20, 2021 FINDINGS: A vascular stent is identified in the proximal femoral artery. A vascular stent is identified in the left iliac artery. Calcified atherosclerotic changes are identified in the pelvic and femoral vessels. The lower lumbar spine, sacrum, and pelvic bones are intact. The right proximal femur is grossly intact. No left hip fracture is noted. No dislocation. The large area of soft tissue calcification previously  described as tumoral calcinosis measures 21 by 15 cm today versus 21 x 14 cm on the comparison study. The small difference in measurement could be technical. IMPRESSION: 1. No fracture or dislocation identified. 2. The large area of soft tissue calcification previously described as tumoral calcinosis measures 21 x 15 cm today versus 21 x 14 cm on the comparison study. The small difference in measurement could be technical. 3. Vascular stents as above. 4. Calcified atherosclerotic changes. Electronically Signed   By: Dorise Bullion III M.D.   On: 11/08/2021 11:28   DG Chest 2 View  Result Date: 11/08/2021 CLINICAL DATA:  Fever EXAM: CHEST - 2 VIEW COMPARISON:  None Available. FINDINGS: Persistent cardiomegaly, mildly more prominent compared to the June 29, 2020. The hila and mediastinum are normal. Vague hazy opacity in the right mid and lower lung. No pneumothorax. No other acute abnormalities or changes. IMPRESSION: 1. Cardiomegaly. 2. Vague hazy opacity in the right mid and lower lung is worrisome for early developing infiltrate/pneumonia given history of fever. Recommend short-term follow-up imaging to ensure resolution. 3. No other acute abnormalities. Electronically Signed   By: Dorise Bullion III M.D.   On: 11/08/2021 11:24   VAS US AORTA/IVC/ILIACS  Result Date: 10/27/2021 ABDOMINAL AORTA STUDY Patient Name:  Susan Horton  Date of Exam:   10/27/2021 Medical Rec #: [id number]      Accession #:    [id number] Date of Birth: January 30, 1970       Patient Gender: F Patient Age:   27 years Exam Location:  [facility] Procedure:      VAS US AORTA/IVC/ILIACS Referring Phys: Monica Martinez --------------------------------------------------------------------------------  Indications: Iliac disease evaluation Patient complaining of extreme pain in the              left foot since surgery 10/09/2021 (excision of soft tissue tumor              left leg). Risk Factors: Hypertension, coronary artery  disease. Vascular Interventions: 07/18/2019: Restenting of Left EIA stent.                         Her vascular history includes left SFA stenting on  12/14/2018, right SFA stenting with left external iliac                         artery stenting on 04/25/2019, with an additional left                         external iliac stenting on 07/18/2019 all for lifestyle                         limiting claudication. Limitations: Patient discomfort and movement. Coughing and unable to lie flat for exam.  Performing Technologist: Ronal Fear RVS, RCS  Examination Guidelines: A complete evaluation includes B-mode imaging, spectral Doppler, color Doppler, and power Doppler as needed of all accessible portions of each vessel. Bilateral testing is considered an integral part of a complete examination. Limited examinations for reoccurring indications may be performed as noted.  Abdominal Aorta Findings: +-------------+-------+----------+----------+--------+--------+--------+ Location     AP (cm)Trans (cm)PSV (cm/s)WaveformThrombusComments +-------------+-------+----------+----------+--------+--------+--------+ RT CIA Prox                   121                                +-------------+-------+----------+----------+--------+--------+--------+ RT CIA Mid                    108                                +-------------+-------+----------+----------+--------+--------+--------+ RT CIA Distal                 247                                +-------------+-------+----------+----------+--------+--------+--------+ RT EIA Prox                   132                                +-------------+-------+----------+----------+--------+--------+--------+ RT EIA Mid                    149                                +-------------+-------+----------+----------+--------+--------+--------+ RT EIA Distal                 132                                 +-------------+-------+----------+----------+--------+--------+--------+ LT CIA Prox                   229                                +-------------+-------+----------+----------+--------+--------+--------+ LT CIA Mid                    190                                +-------------+-------+----------+----------+--------+--------+--------+  LT CIA Distal                 243                                +-------------+-------+----------+----------+--------+--------+--------+ LT EIA Prox                   261                                +-------------+-------+----------+----------+--------+--------+--------+ LT EIA Mid                    232                                +-------------+-------+----------+----------+--------+--------+--------+ LT EIA Distal                 269                                +-------------+-------+----------+----------+--------+--------+--------+ Left CFA 243 cm/s Left proximal SFA 659 cm/s.  Summary: Stenosis: +--------------------+-------------+ Location            Stenosis      +--------------------+-------------+ Right Common Iliac  >50% stenosis +--------------------+-------------+ Left Common Iliac   >50% stenosis +--------------------+-------------+ Right External Iliac<50% stenosis +--------------------+-------------+ Left External Iliac >50% stenosis +--------------------+-------------+ Difficult to determine ends of stent. Left proximal SFA stenosis (75-99%).  *See table(s) above for measurements and observations.  Electronically signed by Monica Martinez MD on 10/27/2021 at 9:28:13 AM.    Final    VAS [name] ABI WITH/WO TBI  Result Date: 10/27/2021  LOWER EXTREMITY DOPPLER STUDY Patient Name:  Susan Horton  Date of Exam:   10/27/2021 Medical Rec #: [id number]      Accession #:    [id number] Date of Birth: [birth year]       Patient Gender: F Patient Age:   21 years Exam Location:  [facility] Procedure:      VAS [name] ABI WITH/WO TBI Referring Phys: --------------------------------------------------------------------------------  Indications: Peripheral artery disease. High Risk Factors: Hypertension, coronary artery disease. Extreme pain of the                    left foot since recent surgery on 10/09/2021.  Vascular Interventions: 07/18/2019: Restenting of Left EIA stent.                         Her vascular history includes left SFA stenting on                         12/14/2018, right SFA stenting with left external iliac                         artery stenting on 04/25/2019, with an additional left                         external iliac stenting on 07/18/2019 all for lifestyle                         limiting claudication. Performing Technologist: Danae Chen  Mcgonigal RVS, RCS  Examination Guidelines: A complete evaluation includes at minimum, Doppler waveform signals and systolic blood pressure reading at the level of bilateral brachial, anterior tibial, and posterior tibial arteries, when vessel segments are accessible. Bilateral testing is considered an integral part of a complete examination. Photoelectric Plethysmograph (PPG) waveforms and toe systolic pressure readings are included as required and additional duplex testing as needed. Limited examinations for reoccurring indications may be performed as noted.  ABI Findings: +---------+------------------+-----+----------+----------------+ Right    Rt Pressure (mmHg)IndexWaveform  Comment          +---------+------------------+-----+----------+----------------+ Brachial 153                                               +---------+------------------+-----+----------+----------------+ PTA                             monophasicnon-compressible +---------+------------------+-----+----------+----------------+ DP                              monophasicnon-compressible +---------+------------------+-----+----------+----------------+  Great Toe                       Absent                     +---------+------------------+-----+----------+----------------+ +---------+------------------+-----+----------+----------------+ Left     Lt Pressure (mmHg)IndexWaveform  Comment          +---------+------------------+-----+----------+----------------+ PTA                             monophasicnon-compressible +---------+------------------+-----+----------+----------------+ DP                              absent                     +---------+------------------+-----+----------+----------------+ Great Toe                       Absent                     +---------+------------------+-----+----------+----------------+ +-------+-----------+-----------+------------+------------+ ABI/TBIToday's ABIToday's TBIPrevious ABIPrevious TBI +-------+-----------+-----------+------------+------------+ Right  [place]         absent     [place]          [place]           +-------+-----------+-----------+------------+------------+ Left   [place]         absent     [place]          0.34         +-------+-----------+-----------+------------+------------+   Summary: Right: Resting right ankle-brachial index indicates noncompressible right lower extremity arteries. Left: Resting left ankle-brachial index indicates noncompressible left lower extremity arteries. *See table(s) above for measurements and observations.  Electronically signed by Monica Martinez MD on 10/27/2021 at 9:27:23 AM.    Final     Microbiology: Results for orders placed or performed during the hospital encounter of 11/08/21  SARS Coronavirus 2 by RT PCR (hospital order, performed in [facility] hospital lab) *cepheid single result test* Anterior Nasal Swab     Status: None   Collection Time: 11/08/21  9:04 AM   Specimen: Anterior Nasal Swab  Result  Value Ref Range Status   SARS Coronavirus 2 by RT PCR NEGATIVE NEGATIVE Final    Comment: (NOTE) SARS-CoV-2 target nucleic acids  are NOT DETECTED.  The SARS-CoV-2 RNA is generally detectable in upper and lower respiratory specimens during the acute phase of infection. The lowest concentration of SARS-CoV-2 viral copies this assay can detect is 250 copies / mL. A negative result does not preclude SARS-CoV-2 infection and should not be used as the sole basis for treatment or other patient management decisions.  A negative result may occur with improper specimen collection / handling, submission of specimen other than nasopharyngeal swab, presence of viral mutation(s) within the areas targeted by this assay, and inadequate number of viral copies (<250 copies / mL). A negative result must be combined with clinical observations, patient history, and epidemiological information.  Fact Sheet for Patients:   https://www.patel.info/  Fact Sheet for Healthcare Providers: https://hall.com/  This test is not yet approved or  cleared by the [place] FDA and has been authorized for detection and/or diagnosis of SARS-CoV-2 by FDA under an Emergency Use Authorization (EUA).  This EUA will remain in effect (meaning this test can be used) for the duration of the COVID-19 declaration under Section 564(b)(1) of the Act, 21 U.S.C. section 360bbb-3(b)(1), unless the authorization is terminated or revoked sooner.  Performed at [facility], [place] [address] [place], [place] [address]   MRSA Next Gen by PCR, Nasal     Status: None   Collection Time: 11/08/21  3:14 PM   Specimen: Nasal Mucosa; Nasal Swab  Result Value Ref Range Status   MRSA by PCR Next Gen NOT DETECTED NOT DETECTED Final    Comment: (NOTE) The GeneXpert MRSA Assay (FDA approved for NASAL specimens only), is one component of a comprehensive MRSA colonization surveillance program. It is not intended to diagnose MRSA infection nor to guide or monitor treatment for MRSA infections. Test performance is not FDA  approved in patients less than 9 years old. Performed at [facility], [place] [address], [place], [place] [address]   Culture, blood (Routine X 2) w Reflex to ID Panel     Status: None   Collection Time: 11/12/21 11:47 AM   Specimen: BLOOD RIGHT WRIST  Result Value Ref Range Status   Specimen Description BLOOD RIGHT WRIST  Final   Special Requests   Final    BOTTLES DRAWN AEROBIC AND ANAEROBIC Blood Culture adequate volume   Culture   Final    NO GROWTH 5 DAYS Performed at [facility], [address] [place], [place] [address]    Report Status 11/17/2021 FINAL  Final  Culture, blood (Routine X 2) w Reflex to ID Panel     Status: None   Collection Time: 11/12/21 12:00 PM   Specimen: BLOOD RIGHT WRIST  Result Value Ref Range Status   Specimen Description BLOOD RIGHT WRIST  Final   Special Requests   Final    BOTTLES DRAWN AEROBIC AND ANAEROBIC Blood Culture adequate volume   Culture   Final    NO GROWTH 5 DAYS Performed at [facility], [place] [address] [place], [place] [address]    Report Status 11/17/2021 FINAL  Final  Aerobic Culture w Gram Stain (superficial specimen)     Status: None   Collection Time: 11/12/21  2:25 PM   Specimen: Wound  Result Value Ref Range Status   Specimen Description WOUND HIP  Final   Special Requests  LEFT  Final   Gram Stain NO WBC SEEN NO ORGANISMS SEEN   Final   Culture   Final    NO GROWTH 2 DAYS Performed at [facility], [place] [address] [place], [place] [address]    Report Status 11/14/2021 FINAL  Final    Labs: CBC: Recent Labs  Lab 11/17/21 0821 11/20/21 0500 11/21/21 0101 11/22/21 0111 11/23/21 0550  WBC 17.6* 21.3* 20.4* 20.2* 20.2*  NEUTROABS  --   --   --   --  17.1*  HGB 7.5* 7.1* 7.3* 7.3* 7.9*  HCT 25.5* 24.6* 25.7* 24.9* 27.7*  MCV 80.2 83.1 82.4 80.8 82.9  PLT 415* 419* 427* 490* 225*   Basic Metabolic Panel: Recent Labs  Lab 11/19/21 1755 11/20/21 0500 11/21/21 0101  11/22/21 0111 11/23/21 0550  NA 135 136 137 139 137  K 3.3* 3.2* 3.3* 3.4* 3.7  CL 94* 96* 97* 95* 96*  CO2 '23 26 28 26 26  '$ GLUCOSE 106* 137* 111* 169* 110*  BUN 18 25* 14 29* 39*  CREATININE 4.07* 4.79* 3.07* 4.35* 5.72*  CALCIUM 8.9 8.7* 9.2 9.4 9.2  PHOS 6.5* 6.9* 5.0* 6.1* 6.8*   Liver Function Tests: Recent Labs  Lab 11/19/21 1755 11/20/21 0500 11/21/21 0101 11/22/21 0111 11/23/21 0550  ALBUMIN 1.9* 1.7* 1.9* 1.8* 1.9*   CBG: Recent Labs  Lab 11/19/21 2128 11/20/21 0801 11/20/21 1420 11/20/21 1720 11/20/21 2121  GLUCAP 169* 141* 93 141* 128*    Discharge time spent: greater than 30 minutes.  Signed: Patrecia Pour, MD [facility] 11/23/2021

## 2021-11-23 NOTE — Progress Notes (Addendum)
Pt currently in HD. Per off going RN pt is to go to 4M05 after HD complete. She has called report to [facility] LPN and room is clean and ready to receive pt after HD.

## 2021-11-23 NOTE — Progress Notes (Signed)
  Inpatient Rehabilitation Admissions Coordinator   Discussed with Dr Bonner Puna and Dr Naaman Plummer. We can admit to CIR today. I met with patient and she is in agreement. Acute team and TOC made aware. Hemodialysis made aware. I will make the arrangements to admit.  Danne Baxter, RN, MSN Rehab Admissions Coordinator [phone] 11/23/2021 12:47 PM

## 2021-11-24 ENCOUNTER — Inpatient Hospital Stay (HOSPITAL_COMMUNITY): Payer: Medicare Other

## 2021-11-24 ENCOUNTER — Encounter (HOSPITAL_COMMUNITY): Payer: Self-pay | Admitting: Physical Medicine and Rehabilitation

## 2021-11-24 ENCOUNTER — Other Ambulatory Visit: Payer: Self-pay

## 2021-11-24 DIAGNOSIS — R5381 Other malaise: Secondary | ICD-10-CM | POA: Diagnosis not present

## 2021-11-24 LAB — CBC
HCT: 25.9 % — ABNORMAL LOW (ref 36.0–46.0)
Hemoglobin: 7.3 g/dL — ABNORMAL LOW (ref 12.0–15.0)
MCH: 23.6 pg — ABNORMAL LOW (ref 26.0–34.0)
MCHC: 28.2 g/dL — ABNORMAL LOW (ref 30.0–36.0)
MCV: 83.8 fL (ref 80.0–100.0)
Platelets: 508 10*3/uL — ABNORMAL HIGH (ref 150–400)
RBC: 3.09 MIL/uL — ABNORMAL LOW (ref 3.87–5.11)
RDW: 20.9 % — ABNORMAL HIGH (ref 11.5–15.5)
WBC: 22.2 10*3/uL — ABNORMAL HIGH (ref 4.0–10.5)
nRBC: 0 % (ref 0.0–0.2)

## 2021-11-24 LAB — RENAL FUNCTION PANEL
Albumin: 1.8 g/dL — ABNORMAL LOW (ref 3.5–5.0)
Anion gap: 18 — ABNORMAL HIGH (ref 5–15)
BUN: 19 mg/dL (ref 6–20)
CO2: 25 mmol/L (ref 22–32)
Calcium: 9.1 mg/dL (ref 8.9–10.3)
Chloride: 94 mmol/L — ABNORMAL LOW (ref 98–111)
Creatinine, Ser: 3.08 mg/dL — ABNORMAL HIGH (ref 0.44–1.00)
GFR, Estimated: 18 mL/min — ABNORMAL LOW (ref >60)
Glucose, Bld: 120 mg/dL — ABNORMAL HIGH (ref 70–99)
Phosphorus: 4.2 mg/dL (ref 2.5–4.6)
Potassium: 4.2 mmol/L (ref 3.5–5.1)
Sodium: 137 mmol/L (ref 135–145)

## 2021-11-24 LAB — VITAMIN D 25 HYDROXY (VIT D DEFICIENCY, FRACTURES): Vit D, 25-Hydroxy: 7.54 ng/mL — ABNORMAL LOW (ref 30–100)

## 2021-11-24 MED ORDER — CAMPHOR-MENTHOL 0.5-0.5 % EX LOTN
1.0000 | TOPICAL_LOTION | CUTANEOUS | Status: DC | PRN
  Filled 2021-11-24: qty 222

## 2021-11-24 MED ORDER — CHLORHEXIDINE GLUCONATE CLOTH 2 % EX PADS
6.0000 | MEDICATED_PAD | Freq: Every day | CUTANEOUS | Status: DC
  Administered 2021-11-25: 6 via TOPICAL

## 2021-11-24 MED ORDER — ATORVASTATIN CALCIUM 40 MG PO TABS
40.0000 mg | ORAL_TABLET | Freq: Every day | ORAL | Status: DC
  Administered 2021-11-24 – 2021-12-14 (×21): 40 mg via ORAL
  Filled 2021-11-24 (×22): qty 1

## 2021-11-24 MED ORDER — METOCLOPRAMIDE HCL 5 MG/5ML PO SOLN
10.0000 mg | Freq: Three times a day (TID) | ORAL | Status: DC
  Administered 2021-11-24 – 2021-12-14 (×50): 10 mg via ORAL
  Filled 2021-11-24 (×65): qty 10

## 2021-11-24 MED ORDER — DARBEPOETIN ALFA 200 MCG/0.4ML IJ SOSY
200.0000 ug | PREFILLED_SYRINGE | INTRAMUSCULAR | Status: DC
  Administered 2021-11-25 – 2021-12-02 (×2): 200 ug via SUBCUTANEOUS
  Filled 2021-11-24 (×3): qty 0.4

## 2021-11-24 NOTE — Plan of Care (Signed)
  Problem: RH Balance Goal: LTG Patient will maintain dynamic standing with ADLs (OT) Description: LTG:  Patient will maintain dynamic standing balance with assist during activities of daily living (OT)  Flowsheets (Taken 11/24/2021 1520) LTG: Pt will maintain dynamic standing balance during ADLs with: Supervision/Verbal cueing   Problem: Sit to Stand Goal: LTG:  Patient will perform sit to stand in prep for activites of daily living with assistance level (OT) Description: LTG:  Patient will perform sit to stand in prep for activites of daily living with assistance level (OT) Flowsheets (Taken 11/24/2021 1520) LTG: PT will perform sit to stand in prep for activites of daily living with assistance level: Supervision/Verbal cueing   Problem: RH Grooming Goal: LTG Patient will perform grooming w/assist,cues/equip (OT) Description: LTG: Patient will perform grooming with assist, with/without cues using equipment (OT) Flowsheets (Taken 11/24/2021 1520) LTG: Pt will perform grooming with assistance level of: Supervision/Verbal cueing   Problem: RH Bathing Goal: LTG Patient will bathe all body parts with assist levels (OT) Description: LTG: Patient will bathe all body parts with assist levels (OT) Flowsheets (Taken 11/24/2021 1520) LTG: Pt will perform bathing with assistance level/cueing: Supervision/Verbal cueing   Problem: RH Dressing Goal: LTG Patient will perform upper body dressing (OT) Description: LTG Patient will perform upper body dressing with assist, with/without cues (OT). Flowsheets (Taken 11/24/2021 1520) LTG: Pt will perform upper body dressing with assistance level of: Independent Goal: LTG Patient will perform lower body dressing w/assist (OT) Description: LTG: Patient will perform lower body dressing with assist, with/without cues in positioning using equipment (OT) Flowsheets (Taken 11/24/2021 1520) LTG: Pt will perform lower body dressing with assistance level of:  Supervision/Verbal cueing   Problem: RH Toileting Goal: LTG Patient will perform toileting task (3/3 steps) with assistance level (OT) Description: LTG: Patient will perform toileting task (3/3 steps) with assistance level (OT)  Flowsheets (Taken 11/24/2021 1520) LTG: Pt will perform toileting task (3/3 steps) with assistance level: Supervision/Verbal cueing   Problem: RH Toilet Transfers Goal: LTG Patient will perform toilet transfers w/assist (OT) Description: LTG: Patient will perform toilet transfers with assist, with/without cues using equipment (OT) Flowsheets (Taken 11/24/2021 1520) LTG: Pt will perform toilet transfers with assistance level of: Supervision/Verbal cueing   Problem: RH Tub/Shower Transfers Goal: LTG Patient will perform tub/shower transfers w/assist (OT) Description: LTG: Patient will perform tub/shower transfers with assist, with/without cues using equipment (OT) Flowsheets (Taken 11/24/2021 1520) LTG: Pt will perform tub/shower stall transfers with assistance level of: Supervision/Verbal cueing

## 2021-11-24 NOTE — Progress Notes (Signed)
Inpatient Rehabilitation Care Coordinator Assessment and Plan Patient Details  Name: Susan Horton MRN: [id number] Date of Birth: 18-Aug-1970  Today's Date: 11/24/2021  Hospital Problems: Principal Problem:   Sepsis [facility])  Past Medical History:  Past Medical History:  Diagnosis Date   Abnormal stress test    Anemia    Angio-edema    Asthma    CAD (coronary artery disease)    DES to mid LAD July 2018, residual moderate RCA disease   Cataract    CKD (chronic kidney disease) stage 4, GFR 15-29 ml/min ([place])    Dialysis T/Th/Sa   DVT (deep venous thrombosis) ([place])    1996 during pregnancy, 2015 left leg   Eczema    Essential hypertension 12/11/2015   Gastroparesis    GERD (gastroesophageal reflux disease)    Gluteal abscess 12/27/2018   Hidradenitis    Migraine    Neuropathy    Peripheral vascular disease ([facility])    blood clot in leg   Progressive angina ([place]) 06/05/2014   Chest pain   Type 2 diabetes mellitus ([place])    Type II diabetes mellitus ([place])    Urticaria    Past Surgical History:  Past Surgical History:  Procedure Laterality Date   A/V FISTULAGRAM N/A 06/22/2017   Procedure: A/V FISTULAGRAM - left arm;  Surgeon: [name], MD;  Location: [place] [facility];  Service: Cardiovascular;  Laterality: N/A;   ABDOMINAL AORTOGRAM W/LOWER EXTREMITY Bilateral 12/14/2018   Procedure: ABDOMINAL AORTOGRAM W/LOWER EXTREMITY;  Surgeon: [name], MD;  Location: [place] [facility];  Service: Cardiovascular;  Laterality: Bilateral;   ABDOMINAL AORTOGRAM W/LOWER EXTREMITY N/A 04/25/2019   Procedure: ABDOMINAL AORTOGRAM W/LOWER EXTREMITY;  Surgeon: [name], MD;  Location: [place] [facility];  Service: Cardiovascular;  Laterality: N/A;   ABDOMINAL AORTOGRAM W/LOWER EXTREMITY Left 07/18/2019   Procedure: ABDOMINAL AORTOGRAM W/LOWER EXTREMITY;  Surgeon: [name], MD;  Location: [place] [facility];  Service: Cardiovascular;  Laterality: Left;    ABDOMINAL AORTOGRAM W/LOWER EXTREMITY N/A 11/16/2021   Procedure: ABDOMINAL AORTOGRAM W/LOWER EXTREMITY;  Surgeon: [name], MD;  Location: [place] [facility];  Service: Cardiovascular;  Laterality: N/A;   ABDOMINOPLASTY     ADENOIDECTOMY     APPENDECTOMY  1995   AV FISTULA PLACEMENT Left 02/01/2017   Procedure: ARTERIOVENOUS BRACHIOCEPHALIC (AV) FISTULA CREATION;  Surgeon: [name], MD;  [facility];  Service: Vascular;  Laterality: Left;   [place] INTERVENTION N/A 08/05/2016   Procedure: Coronary Stent Intervention;  Surgeon: [name], MD;  Location: [place] [facility];  Service: Cardiovascular;  Laterality: N/A;   ESOPHAGOGASTRODUODENOSCOPY (EGD) WITH PROPOFOL N/A 07/01/2020   Procedure: ESOPHAGOGASTRODUODENOSCOPY (EGD) WITH PROPOFOL;  Surgeon: Gatha Mayer, MD;  Location: [place];  Service: Endoscopy;  Laterality: N/A;   EXPLORATORY LAPAROTOMY  08/14/2005   lysis of adhesions, drainage of tubo-ovarian abscess   FISTULA SUPERFICIALIZATION Left 09/14/2017   Procedure: FISTULA SUPERFICIALIZATION ARTERIOVENOUS FISTULA LEFT ARM;  Surgeon: [name], MD;  Location: [place];  Service: Vascular;  Laterality: Left;   FLEXIBLE SIGMOIDOSCOPY N/A 01/04/2019   Procedure: FLEXIBLE SIGMOIDOSCOPY;  Surgeon: Clarene Essex, MD;  Location: [place];  Service: Endoscopy;  Laterality: N/A;   HYDRADENITIS EXCISION  02/08/2011   Procedure: EXCISION HYDRADENITIS GROIN;  Surgeon: [name], MD;  Location: [place];  Service: General;  Laterality: N/A;  Excisioin of Hidradenitis Left groin   INCISION AND DRAINAGE ABSCESS N/A 01/11/2019   Procedure: INCISION AND DRAINAGE SACRAL ABSCESS;  Surgeon: Georganna Skeans, MD;  Location: [place];  Service: General;  Laterality: N/A;   INGUINAL HIDRADENITIS EXCISION  07/06/2010   bilateral   INSERTION OF DIALYSIS CATHETER N/A  09/14/2017   Procedure: INSERTION OF TUNNELED DIALYSIS CATHETER Right Internal Jugular;  Surgeon: [name], MD;  Location: [place];  Service: Vascular;  Laterality: N/A;   IR FLUORO GUIDE CV LINE RIGHT  03/01/2019   IR US GUIDE BX ASP/DRAIN  01/01/2019   IR US GUIDE VASC ACCESS RIGHT  03/01/2019   LEFT HEART CATH AND CORONARY ANGIOGRAPHY N/A 08/05/2016   Procedure: Left Heart Cath and Coronary Angiography;  Surgeon: [name], MD;  Location: [place] [facility];  Service: Cardiovascular;  Laterality: N/A;   PERIPHERAL VASCULAR INTERVENTION Left 12/14/2018   Procedure: PERIPHERAL VASCULAR INTERVENTION;  Surgeon: [name], MD;  Location: [place] [facility];  Service: Cardiovascular;  Laterality: Left;  SFA   PERIPHERAL VASCULAR INTERVENTION Left 04/25/2019   Procedure: PERIPHERAL VASCULAR INTERVENTION;  Surgeon: [name], MD;  Location: [place] [facility];  Service: Cardiovascular;  Laterality: Left;  right superficial femoral, and left iliac   PERIPHERAL VASCULAR INTERVENTION Left 07/18/2019   Procedure: PERIPHERAL VASCULAR INTERVENTION;  Surgeon: [name], MD;  Location: [place] [facility];  Service: Cardiovascular;  Laterality: Left;  external iliac   REDUCTION MAMMAPLASTY  2002   TONSILLECTOMY     TUBAL LIGATION  1996   VAGINAL HYSTERECTOMY  08/04/2005   and cysto   Social History:  reports that she quit smoking about 5 years ago. Her smoking use included cigarettes. She has never used smokeless tobacco. She reports current drug use. Frequency: 2.00 times per week. Drug: Marijuana. She reports that she does not drink alcohol.  Family / Support Systems Anticipated Caregiver: sister and family Ability/Limitations of Caregiver: none Caregiver Availability: 24/7 Family Dynamics: supprt from sister and other family members  Social History Preferred language: English Religion: None Health Literacy - How often do you need to have someone help you  when you read instructions, pamphlets, or other written material from your doctor or pharmacy?: Never Writes: Yes   Abuse/Neglect Abuse/Neglect Assessment Can Be Completed: Yes Physical Abuse: Denies Verbal Abuse: Denies Sexual Abuse: Denies Exploitation of patient/patient's resources: Denies Self-Neglect: Denies  Patient response to: Social Isolation - How often do you feel lonely or isolated from those around you?: Never  Emotional Status Recent Psychosocial Issues: coping Psychiatric History: n/a Substance Abuse History: n/a  Patient / Family Perceptions, Expectations & Goals Pt/Family understanding of illness & functional limitations: yes Premorbid pt/family roles/activities: Previously MOD I with cane and driving Anticipated changes in roles/activities/participation: patient anticipates discharging to sister's home at supervision level Pt/family expectations/goals: MOD I to Supervision  Community Resources Premorbid Home Care/DME Agencies: Other (Comment) ([facility]) Transportation available at discharge: family able to transport Is the patient able to respond to transportation needs?: Yes In the past 12 months, has lack of transportation kept you from medical appointments or from getting medications?: No In the past 12 months, has lack of transportation kept you from meetings, work, or from getting things needed for daily living?: No Resource referrals recommended: Neuropsychology  Discharge Planning Living Arrangements: Other relatives (Lives with sister) Support Systems: Other relatives, Children Type of Residence: Private residence Insurance Resources: [employer] Financial Resources: Family Support Financial Screen Referred: No Living  Expenses: Lives with family Does the patient have any problems obtaining your medications?: No Home Management: Independent Patient/Family Preliminary Plans: Patient plans to continue to manage Care Coordinator Barriers to Discharge:  Hemodialysis Care Coordinator Anticipated Follow Up Needs: HH/OP Expected length of stay: 10-12 Days  Clinical Impression  SW spoke with patient and introduced self and explained role. Patient anticipates discharging home with her sister and 3 daughters to come to her home and assist her. Family already in the process of accommodating home. Patient currently in process of having bathroom removed and will require a letter from physician to have them expedite the process. Patient has 2 steps to enter her home. No additional questions or concern.  Dyanne Iha 11/24/2021, 12:15 PM

## 2021-11-24 NOTE — Discharge Instructions (Addendum)
Inpatient Rehab Discharge Instructions  Susan Horton Discharge date and time: No discharge date for patient encounter.   Activities/Precautions/ Functional Status: Activity: activity as tolerated Diet: renal diet Wound Care: Routine skin checks Functional status:  ___ No restrictions     ___ Walk up steps independently ___ 24/7 supervision/assistance   ___ Walk up steps with assistance ___ Intermittent supervision/assistance  ___ Bathe/dress independently ___ Walk with walker     _x__ Bathe/dress with assistance ___ Walk Independently    ___ Shower independently ___ Walk with assistance    ___ Shower with assistance ___ No alcohol     ___ Return to work/school ________  Special Instructions: No driving smoking or alcohol  Continue hemodialysis as directed  Wound care to left hip.  Cleanse with normal saline, pat dry, cover with folded layer of Xeroform gauze, top with dry gauze and secure with silicone foam.  Change daily  Medihoney application applied to all her wounds daily.  Apply thin layer to wound.   My questions have been answered and I understand these instructions. I will adhere to these goals and the provided educational materials after my discharge from the hospital.  Patient/Caregiver Signature _______________________________ Date __________  Clinician Signature _______________________________________ Date __________  Please bring this form and your medication list with you to all your follow-up doctor's appointments.

## 2021-11-24 NOTE — Evaluation (Signed)
Occupational Therapy Assessment and Plan  Patient Details  Name: Susan Horton MRN: [id number] Date of Birth: [birth year]  OT Diagnosis: acute pain and muscle weakness (generalized) Rehab Potential: Rehab Potential (ACUTE ONLY): Good ELOS: 14-18 days   Today's Date: 11/25/2021 OT Individual Time: 78 - 7824       Hospital Problem: Principal Problem:   Sepsis ([place])   Past Medical History:  Past Medical History:  Diagnosis Date   Abnormal stress test    Anemia    Angio-edema    Asthma    CAD (coronary artery disease)    DES to mid LAD July 2018, residual moderate RCA disease   Cataract    CKD (chronic kidney disease) stage 4, GFR 15-29 ml/min (HCC)    Dialysis T/Th/Sa   DVT (deep venous thrombosis) ([place])    1996 during pregnancy, 2015 left leg   Eczema    Essential hypertension 12/11/2015   Gastroparesis    GERD (gastroesophageal reflux disease)    Gluteal abscess 12/27/2018   Hidradenitis    Migraine    Neuropathy    Peripheral vascular disease (HCC)    blood clot in leg   Progressive angina ([place]) 06/05/2014   Chest pain   Type 2 diabetes mellitus ([place])    Type II diabetes mellitus ([place])    Urticaria    Past Surgical History:  Past Surgical History:  Procedure Laterality Date   A/V FISTULAGRAM N/A 06/22/2017   Procedure: A/V FISTULAGRAM - left arm;  Surgeon: [name], MD;  Location: [place] [facility];  Service: Cardiovascular;  Laterality: N/A;   ABDOMINAL AORTOGRAM W/LOWER EXTREMITY Bilateral 12/14/2018   Procedure: ABDOMINAL AORTOGRAM W/LOWER EXTREMITY;  Surgeon: [name], MD;  Location: [place] [facility];  Service: Cardiovascular;  Laterality: Bilateral;   ABDOMINAL AORTOGRAM W/LOWER EXTREMITY N/A 04/25/2019   Procedure: ABDOMINAL AORTOGRAM W/LOWER EXTREMITY;  Surgeon: [name], MD;  Location: [place] [facility];  Service: Cardiovascular;  Laterality: N/A;   ABDOMINAL AORTOGRAM W/LOWER EXTREMITY Left 07/18/2019    Procedure: ABDOMINAL AORTOGRAM W/LOWER EXTREMITY;  Surgeon: [name], MD;  Location: [place] [facility];  Service: Cardiovascular;  Laterality: Left;   ABDOMINAL AORTOGRAM W/LOWER EXTREMITY N/A 11/16/2021   Procedure: ABDOMINAL AORTOGRAM W/LOWER EXTREMITY;  Surgeon: [name], MD;  Location: [place] [facility];  Service: Cardiovascular;  Laterality: N/A;   ABDOMINOPLASTY     ADENOIDECTOMY     APPENDECTOMY  1995   AV FISTULA PLACEMENT Left 02/01/2017   Procedure: ARTERIOVENOUS BRACHIOCEPHALIC (AV) FISTULA CREATION;  Surgeon: [name], MD;  Location: [facility];  Service: Vascular;  Laterality: Left;   [place] INTERVENTION N/A 08/05/2016   Procedure: Coronary Stent Intervention;  Surgeon: [name], MD;  Location: [place] [facility];  Service: Cardiovascular;  Laterality: N/A;   ESOPHAGOGASTRODUODENOSCOPY (EGD) WITH PROPOFOL N/A 07/01/2020   Procedure: ESOPHAGOGASTRODUODENOSCOPY (EGD) WITH PROPOFOL;  Surgeon: Gatha Mayer, MD;  Location: [place];  Service: Endoscopy;  Laterality: N/A;   EXPLORATORY LAPAROTOMY  08/14/2005   lysis of adhesions, drainage of tubo-ovarian abscess   FISTULA SUPERFICIALIZATION Left 09/14/2017   Procedure: FISTULA SUPERFICIALIZATION ARTERIOVENOUS FISTULA LEFT ARM;  Surgeon: [name], MD;  Location: [place];  Service: Vascular;  Laterality: Left;   FLEXIBLE SIGMOIDOSCOPY N/A 01/04/2019   Procedure: FLEXIBLE SIGMOIDOSCOPY;  Surgeon: Clarene Essex, MD;  Location: [place];  Service: Endoscopy;  Laterality:  N/A;   HYDRADENITIS EXCISION  02/08/2011   Procedure: EXCISION HYDRADENITIS GROIN;  Surgeon: [name], MD;  Location: [place];  Service: General;  Laterality: N/A;  Excisioin of Hidradenitis Left groin   INCISION AND DRAINAGE ABSCESS N/A 01/11/2019   Procedure: INCISION AND DRAINAGE SACRAL ABSCESS;  Surgeon: Georganna Skeans, MD;  Location: [place];  Service: General;  Laterality: N/A;   INGUINAL HIDRADENITIS EXCISION  07/06/2010   bilateral   INSERTION OF DIALYSIS CATHETER N/A 09/14/2017   Procedure: INSERTION OF TUNNELED DIALYSIS CATHETER Right Internal Jugular;  Surgeon: [name], MD;  Location: [place];  Service: Vascular;  Laterality: N/A;   IR FLUORO GUIDE CV LINE RIGHT  03/01/2019   IR US GUIDE BX ASP/DRAIN  01/01/2019   IR US GUIDE VASC ACCESS RIGHT  03/01/2019   LEFT HEART CATH AND CORONARY ANGIOGRAPHY N/A 08/05/2016   Procedure: Left Heart Cath and Coronary Angiography;  Surgeon: [name], MD;  Location: [place] [facility];  Service: Cardiovascular;  Laterality: N/A;   PERIPHERAL VASCULAR INTERVENTION Left 12/14/2018   Procedure: PERIPHERAL VASCULAR INTERVENTION;  Surgeon: [name], MD;  Location: [place] [facility];  Service: Cardiovascular;  Laterality: Left;  SFA   PERIPHERAL VASCULAR INTERVENTION Left 04/25/2019   Procedure: PERIPHERAL VASCULAR INTERVENTION;  Surgeon: [name], MD;  Location: [place] [facility];  Service: Cardiovascular;  Laterality: Left;  right superficial femoral, and left iliac   PERIPHERAL VASCULAR INTERVENTION Left 07/18/2019   Procedure: PERIPHERAL VASCULAR INTERVENTION;  Surgeon: [name], MD;  Location: [place] [facility];  Service: Cardiovascular;  Laterality: Left;  external iliac   REDUCTION MAMMAPLASTY  2002   TONSILLECTOMY     TUBAL LIGATION  1996   VAGINAL HYSTERECTOMY  08/04/2005   and cysto    Assessment & Plan Clinical Impression: Patient is a 51 y.o. year old right-handed female with history of end-stage renal disease with hemodialysis, CAD with stenting maintained on aspirin and Plavix, hypertension, diastolic and systolic congestive heart failure, diabetes mellitus peripheral neuropathy, lower extremity DVT, asthma/quit smoking 5 years ago, peripheral vascular disease with history of left external iliac artery and  left SFA stenting.  She underwent recent excision of tumoral calcinosis left hip/thigh, drain and sutures removed 10/20/2021 without evidence of infection per [facility] notes at [facility].  Per chart review use of straight point cane prior to admission.  1 level home.  Plans to stay with her sister on discharge.  Presented 11/08/2021 with progressive weakness and fall since 11/06/2021.  On arrival she is found to be febrile as well as tachycardic with mild altered mental status.  She was admitted to the ICU for possible sepsis and placed on broad-spectrum antibiotics 11/08/2021 - 11/16/2021.  Cranial CT scan negative.  Chest x-ray showed vague hazy opacity right mid and lower lung worrisome for early developing infiltrate/pneumonia.  She did have some noted drainage from the left hip.  X-rays unilateral with and without contrast of pelvis showed no fracture or dislocation identified.  There was a large area of soft tissue calcification previously described as tumoral calcinosis measuring 21 x 15 cm versus 21 x 14 on the comparison study.  CT of left hip showed extensive calcification with areas layering hyperdense sediment involving the proximal extensor, gluteal and flexor compartment muscles of the left thigh with overall appearance most consistent with tumoral calcinosis.  Soft tissue edema in the subcutaneous fat along the proximal lateral hip and thigh  reflecting mild cellulitis.  Admission chemistries potassium 6.9 BUN 117 creatinine 10.68, WBC 12,800, hemoglobin 8.8, SARS coronavirus negative, lactic acid 2.0-2.5, procalcitonin 9.57.  Echocardiogram with ejection fraction of 45 to 50% right ventricular systolic function moderately reduced.  Nephrology consulted with hemodialysis ongoing.  Vascular surgery consulted 11/14/2021 due to left foot tenderness and known peripheral arterial disease and concern for occlusion of her stents..  Aortogram completed undergoing stent of right common iliac artery as well  as left common iliac artery with drug-coated balloon angioplasty left common femoral artery and stent of left SFA 11/16/2021 per Dr. [name].  Patient continues to have some leukocytosis with all antibiotic treatment completed 11/08/2021 - 11/16/2021 and lattest WBC 20,200.  Initially concern for possible bacteremia in patient with vascular access on hemodialysis.  Cultures done after antibiotics showing no growth to date and patient remains afebrile.  He remains on Plavix and aspirin as prior to admission.  Subcutaneous heparin was added for DVT prophylaxis.  Patient with abrupt onset bilateral sensorineural hearing loss confirmed by audiology 11/7 though symptoms have improved and recommendations follow-up outpatient. Pt with recent admission to the hospital on 10/29 with progressive weakness and falls since 10/27. Patient transferred to CIR on 11/23/2021 .    Patient currently requires mod A with basic self-care skills and functional transfers secondary to muscle weakness, decreased cardiorespiratoy endurance, and decreased standing balance, decreased balance strategies, and acute pain  and increased pain/sensitivity in L foot. Prior to hospitalization, patient completed all ADLs with modified independence using a cane.  Patient will benefit from skilled intervention to increase independence with basic self-care skills prior to discharge  home with family . Anticipate patient will require intermittent supervision and follow up home health.  OT - End of Session Endurance Deficit: Yes OT Assessment Rehab Potential (ACUTE ONLY): Good OT Barriers to Discharge: Wound Care;Hemodialysis OT Plan OT Intensity: Minimum of 1-2 x/day, 45 to 90 minutes OT Frequency: 5 out of 7 days OT Duration/Estimated Length of Stay: 14-18 days OT Treatment/Interventions: Disease mangement/prevention;Functional mobility training;Pain management;Patient/family education;Self Care/advanced ADL retraining;Skin care/wound  managment;Therapeutic Activities;Therapeutic Exercise;UE/LE Strength taining/ROM;UE/LE Coordination activities;Balance/vestibular training OT Self Feeding Anticipated Outcome(s): n/a OT Basic Self-Care Anticipated Outcome(s): supervision OT Toileting Anticipated Outcome(s): supervision OT Bathroom Transfers Anticipated Outcome(s): supervision OT Recommendation Patient destination: Home Follow Up Recommendations: Home health OT Equipment Recommended: To be determined   OT Evaluation Precautions/Restrictions  Precautions Precautions: Fall  Home Living/Prior [place] expects to be discharged to:: Private residence Living Arrangements: Other relatives (Lives with sister) Available Help at Discharge: Family, Available 24 hours/day Type of Home: House Home Access: Stairs to enter Technical brewer of Steps: 2 Entrance Stairs-Rails: None Home Layout: One level Bathroom Shower/Tub: Government social research officer Accessibility: Yes Additional Comments: pt reports using SPC prior to [place] and also having a RW and a hoveraround scooter  Lives With: Alone Prior Function Level of Independence: Requires assistive device for independence  Able to Take Stairs?: Yes Driving: Yes Vision Ability to See in Adequate Light: 0 Adequate Perception  Perception: Within Functional Limits Praxis Praxis: Intact Cognition Cognition Overall Cognitive Status: Within Functional Limits for tasks assessed Arousal/Alertness: Awake/alert Orientation Level: Person;Place;Situation Memory: Appears intact Attention: Selective Selective Attention: Appears intact Awareness: Appears intact Sensation Sensation Light Touch: Impaired Detail Light Touch Impaired Details: Impaired LLE Proprioception: Impaired Detail Proprioception Impaired Details: Impaired LLE Additional Comments: WFL on RLE. Pt reported tingling along L S1 dermatome and absent  sensation along L great toe, L  lateral malleoli, and plantar aspect of L foot. Pt reports being unable to tolerate wearing sock on LLE due to hypersensitivity. Coordination Gross Motor Movements are Fluid and Coordinated: No Fine Motor Movements are Fluid and Coordinated: Yes Coordination and Movement Description: grossly uncoordinated due to L foot pain,L foot drop, generalized weakness/deconditioning, fatigue, and decreased balance strategies Motor  Motor Motor: Other (comment) Motor - Skilled Clinical Observations: grossly uncoordinated due to L foot pain, L foot drop, generalized weakness/deconditioning, fatigue, and decreased balance strategies  Trunk/Postural Assessment  Cervical Assessment Cervical Assessment: Exceptions to [facility] (Forward head) Thoracic Assessment Thoracic Assessment: Exceptions to [facility] (Mild kyphosis) Lumbar Assessment Lumbar Assessment: Exceptions to [facility] (Posterior pelvic tilt)  Balance Static Sitting Balance Static Sitting - Balance Support: Feet supported Static Sitting - Level of Assistance: 6: Modified independent (Device/Increase time) Dynamic Sitting Balance Dynamic Sitting - Balance Support: During functional activity Dynamic Sitting - Level of Assistance: 6: Modified independent (Device/Increase time) Dynamic Sitting - Balance Activities: Lateral lean/weight shifting;Forward lean/weight shifting Static Standing Balance Static Standing - Balance Support: During functional activity Static Standing - Level of Assistance: 5: Stand by assistance Charlaine Dalton) Extremity/Trunk Assessment   LUE Assessment LUE Assessment: Within Functional Limits  Care Tool- See Care Tool in flowsheets.   SHORT TERM GOAL WEEK 1 OT Short Term Goal 1 (Week 1): Pt will perform STS transfers with Mod A + LRAD in preparation ADL tasks. OT Short Term Goal 2 (Week 1): Pt will engage in standing table-top activities >2 minutes with Mod A + LRAD in preparation for ADL tasks. OT Short Term  Goal 3 (Week 1): Pt will demonstrate carry-over of LB compensatory dressing techniques with supervision.  Recommendations for other services: None    Skilled Therapeutic Intervention OT evaluation initiated with OT goals, purpose and role discussed.   Pt received seated in wheelchair for skilled OT session with focus on ADL retraining. Pt agreeable to interventions, demonstrating overall pleasant mood. Pt with un-quantifiable pain, stating "this is the best day ever" in reference to having the opportunity to shower. OT offered intermediate rest breaks and positioning suggestions throughout session to address pain/fatigue and maximize participation/safety in session.   Pt completed toileting, shower, and UB/LB dressing tasks with overall set-up/retrieval of needed items and Mod Vcs for compensatory techniques.  Pt transferred onto toilet with squat pivot, Mod-Max A and use of grab bars, doffing scrub pants and completing peri-care with lateral leans and A for brief management. Pt successfully used stedy lift to complete transfer off of toilet, onto/off of shower chair, and onto bedside with mod A + mod Vcs for positioning/sequencing. Pt able to bathe and undress/dress BLEs with use of figure-4 technique and increased time, requiring min A to clean/thread L foot due to pain.   Pt remained in bed with all immediate needs meet at end of session. Pt continues to be appropriate for skilled OT intervention to address functional mobility and activity tolerance to promote further functional independence.   ADL ADL Upper Body Bathing: Setup Where Assessed-Upper Body Bathing: Shower Lower Body Bathing: Moderate cueing;Minimal assistance Where Assessed-Lower Body Bathing: Shower Upper Body Dressing: Setup Where Assessed-Upper Body Dressing:  (Seated on TB) Lower Body Dressing: Moderate assistance;Moderate cueing Where Assessed-Lower Body Dressing: Edge of bed Toileting: Moderate assistance Where  Assessed-Toileting: Glass blower/designer: Moderate assistance Toilet Transfer Method: Stand pivot Toilet Transfer Equipment: Energy manager: Moderate assistance Social research officer, government Method:  Clarise Cruz Steady) Youth worker: Civil engineer, contracting with back;Grab bars  Mobility  Bed Mobility Bed Mobility: Sit to Supine Sit to Supine: Moderate Assistance - Patient 50-74% Transfers Sit to Stand: Moderate Assistance - Patient 50-74% Stand to Sit: Moderate Assistance - Patient 50-74%   Discharge Criteria: Patient will be discharged from OT if patient refuses treatment 3 consecutive times without medical reason, if treatment goals not met, if there is a change in medical status, if patient makes no progress towards goals or if patient is discharged from hospital.  The above assessment, treatment plan, treatment alternatives and goals were discussed and mutually agreed upon: by patient  Maudie Mercury, OTR/L, MSOT  11/25/2021, 7:56 AM

## 2021-11-24 NOTE — Progress Notes (Signed)
[facility] Progress Note   Subjective: Seen in room. No new complaints. No cp/dyspnea. Completed dialysis yesterday -no issues but not happy about finishing late.   Objective Vitals:   11/23/21 2306 11/23/21 2346 11/24/21 0553  BP:  (!) 99/40 (!) 106/50  Pulse:  78 86  Resp:  18 16  Temp:  98.8 F (37.1 C) 98.2 F (36.8 C)  TempSrc:  Oral Oral  SpO2:  97% 100%  Weight: 61.3 kg 61.3 kg   Height:  '5\' 5"'$  (1.651 m)      Additional Objective Labs: Basic Metabolic Panel: Recent Labs  Lab 11/22/21 0111 11/23/21 0550 11/24/21 0034  NA 139 137 137  K 3.4* 3.7 4.2  CL 95* 96* 94*  CO2 '26 26 25  '$ GLUCOSE 169* 110* 120*  BUN 29* 39* 19  CREATININE 4.35* 5.72* 3.08*  CALCIUM 9.4 9.2 9.1  PHOS 6.1* 6.8* 4.2   CBC: Recent Labs  Lab 11/20/21 0500 11/21/21 0101 11/22/21 0111 11/23/21 0550 11/24/21 0034  WBC 21.3* 20.4* 20.2* 20.2* 22.2*  NEUTROABS  --   --   --  17.1*  --   HGB 7.1* 7.3* 7.3* 7.9* 7.3*  HCT 24.6* 25.7* 24.9* 27.7* 25.9*  MCV 83.1 82.4 80.8 82.9 83.8  PLT 419* 427* 490* 525* 508*   Blood Culture    Component Value Date/Time   SDES WOUND HIP 11/12/2021 1425   SPECREQUEST  LEFT 11/12/2021 1425   CULT  11/12/2021 1425    NO GROWTH 2 DAYS Performed at [facility], [place] [address], [place], [place] [address]    REPTSTATUS 11/14/2021 FINAL 11/12/2021 1425     Physical Exam General: Well appearing, nad Heart: RRR  Lungs: Clear bilaterally  Abdomen: soft non -tender Extremities: No LE edema  Dialysis Access: L AVF +bruit   Medications:   aspirin  81 mg Oral Daily   atorvastatin  40 mg Oral Daily   clopidogrel  75 mg Oral Q breakfast   [START ON 11/25/2021] darbepoetin (ARANESP) injection - NON-DIALYSIS  200 mcg Subcutaneous Q Wed-1800   [START ON 11/25/2021] doxercalciferol  7 mcg Intravenous Q M,W,F-HD   heparin  5,000 Units Subcutaneous Q8H   lidocaine  1 patch Transdermal Q24H   metoCLOPramide  10 mg Oral TID AC    metoprolol succinate  25 mg Oral Daily   montelukast  10 mg Oral QHS   pantoprazole  40 mg Oral Daily   thiamine  100 mg Oral Daily    Dialysis Orders:  MWF [facility] 3h 3mn 400/600  61kg  2/2 bath P2  Hep 4000  LUE AVF - mircera 200 mcg IV q2, last 10/16, due 10/30 - venofer '100mg'$  IV tiw thru 11/06 - doxercalciferol 7 ug tiw - sod thiosulfate 25 gm iv tiw  Assessment/Plan: Septic shock: Resolved. Finished course of Zosyn. Now in CIR d/t debility.  ESRD: Required CRRT on admit due to AMS, high BUN, etc. Now back to iHD and tolerating well.   Uses L AVF for dialysis.  On MWF schedule. Next HD Wed.  3. Encephalopathy: D/t shock/?meds -has resolved , but some decreased hearing and foot drop 11/7- now spontaneously improving. No offending meds identified.  4. Myoclonus: D/t meds, severe (cefepime, gabapentin and narcotics) now resolved.  5. Hyperkalemia: On admit, resolved with dialysis. 6. HTN/volume: BP controlled, euvolemic on exam but volume up per weights, but bed weight, UF as tolerated on HD 7. Anemia of ESRD: Hgb 7.3. Continue max dose Aranesp  243mg weekly while here. 8. Secondary HPTH: CorrCa ok, Phos not to goal.   Reports trouble tolerating any binder secondary to gastroparesis-we will hold binder for now and follow trend - was on Fosrenol, no iron binders with ferritin 5000 9. Nutrition: Alb low,  continue supplements sometimes refusing Nepro 10. 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 [facility] - op note described 1 cup liquid being removed. Per our imaging her, size appears the same which is unfortunate. re-cultured 11/2 - NGTD. 11. T2DM with severe gastroparesis: Ongoing issue, s/p esophageal myotomy 09/17/21. Has issues swallow/keeping pills down. 12. L foot pain; Xray negative, uric acid normal. VVS consulted, s/p LE angiography with improvement in pain  Susan ChildPA-C [facility] 11/24/2021,2:29 PM

## 2021-11-24 NOTE — Progress Notes (Signed)
Inpatient Rehabilitation Admission Medication Review by a Pharmacist   A complete drug regimen review was completed for this patient to identify any potential clinically significant medication issues.   High Risk Drug Classes Is patient taking? Indication by Medication  Antipsychotic No    Anticoagulant Yes Heparin- VTE ppx  Antibiotic No    Opioid Yes OxyIR- acute pain  Antiplatelet Yes Aspirin / Plavix- CVA ppx  Hypoglycemics/insulin No    Vasoactive Medication Yes Toprol- HTN  Chemotherapy No    Other Yes Aranesp- anemia 2/2 ESRD Hectorol- 2ndary hyperparathyroidism 2/2 ESRD Breo ellipta / Duoneb- asthma Lidoderm- joint pain Imodium- diarrhea Singulair- asthma Protonix- GERD Miralax- constipation         Type of Medication Issue Identified Description of Issue Recommendation(s)  Drug Interaction(s) (clinically significant)        Duplicate Therapy        Allergy        No Medication Administration End Date        Incorrect Dose        Additional Drug Therapy Needed        Significant med changes from prior encounter (inform family/care partners about these prior to discharge).  PTA medications not resumed: atorvastatin, dialyvite, gabapentin, hydralazine, Fosrenol, amlodipine, Phoslo, Flexeril, sucralfate, promethazine  Consider resuming PTA medications during CIR admit or upon discharge as appropriate    Other            Clinically significant medication issues were identified that warrant physician communication and completion of prescribed/recommended actions by midnight of the next day:  No  Pharmacist comments: Consider d/c pantoprazole as not taking PTA. Consider resuming atorvastatin. Sent secure chat to Marlowe Sh[place], PA   Time spent performing this drug regimen review (minutes):  [place], PharmD, BCPS Clinical Pharmacist Clinical phone for 11/24/2021 until 3p is [phone] 11/24/2021 7:35 AM

## 2021-11-24 NOTE — Progress Notes (Signed)
Physical Therapy Session Note  Patient Details  Name: Susan Horton MRN: [id number] Date of Birth: 01-24-1970  Today's Date: 11/24/2021 PT Individual Time: 1100-1155 PT Individual Time Calculation (min): 55 min   Short Term Goals: Week 1:  PT Short Term Goal 1 (Week 1): pt will transfer sit<>stand with LRAD and mod A consistantly PT Short Term Goal 2 (Week 1): pt will transfer bed<>chair with LRAD and min A PT Short Term Goal 3 (Week 1): pt will ambulate 25f with LRAD and mod A  Skilled Therapeutic Interventions/Progress Updates:   Received pt sitting in WC, pt agreeable to PT treatment, and reported pain 5/10 in L foot (premedicated). Pt suddenly with episode of emesis from acid reflux, but okay afterwards - provided tissues and washcloth. Session with emphasis on functional mobility/transfers, generalized strengthening and endurance, and dynamic standing balance/coordination. Pt performed WC mobility 1560fusing BUE and supervision with 1 rest break; then transported remainder of way [facility] due to fatigue. Attempted to stand [facility] x 3 trials with max/total A and cues for hand placement and anterior weight shifting, however pt unable to stand. Retrieved Stedy and pt stood [facility] max A. Worked on sit<>stands from stedy flaps x 7 reps with min A initially fading to supervision. On last 3 stands, pt able to let go with both hands and maintain balance without UE support and close supervision. Pt actually able to place ~ 45% weight through LLE! Returned to room and concluded session with pt sitting in WC, needs within reach, and seatbelt alarm on.   Therapy Documentation Precautions:  Restrictions Weight Bearing Restrictions: No  Therapy/Group: Individual Therapy AnAlfonse AlpersT, DPT  11/24/2021, 6:56 AM

## 2021-11-24 NOTE — Evaluation (Signed)
Physical Therapy Assessment and Plan  Patient Details  Name: Susan Horton MRN: [id number] Date of Birth: 1970-06-28  PT Diagnosis: Abnormal posture, Abnormality of gait, Difficulty walking, Impaired sensation, Muscle weakness, and Pain in L foot Rehab Potential: Good ELOS: 14-18 days   Today's Date: 11/24/2021 PT Individual Time: 1100-1155 PT Individual Time Calculation (min): 55 min    Hospital Problem: Principal Problem:   Sepsis ([place])   Past Medical History:  Past Medical History:  Diagnosis Date   Abnormal stress test    Anemia    Angio-edema    Asthma    CAD (coronary artery disease)    DES to mid LAD July 2018, residual moderate RCA disease   Cataract    CKD (chronic kidney disease) stage 4, GFR 15-29 ml/min ([place])    Dialysis T/Th/Sa   DVT (deep venous thrombosis) ([place])    1996 during pregnancy, 2015 left leg   Eczema    Essential hypertension 12/11/2015   Gastroparesis    GERD (gastroesophageal reflux disease)    Gluteal abscess 12/27/2018   Hidradenitis    Migraine    Neuropathy    Peripheral vascular disease ([place])    blood clot in leg   Progressive angina ([place]) 06/05/2014   Chest pain   Type 2 diabetes mellitus ([place])    Type II diabetes mellitus ([place])    Urticaria    Past Surgical History:  Past Surgical History:  Procedure Laterality Date   A/V FISTULAGRAM N/A 06/22/2017   Procedure: A/V FISTULAGRAM - left arm;  Surgeon: [name], MD;  Location: [place] [facility];  Service: Cardiovascular;  Laterality: N/A;   ABDOMINAL AORTOGRAM W/LOWER EXTREMITY Bilateral 12/14/2018   Procedure: ABDOMINAL AORTOGRAM W/LOWER EXTREMITY;  Surgeon: [name], MD;  Location: [place] [facility];  Service: Cardiovascular;  Laterality: Bilateral;   ABDOMINAL AORTOGRAM W/LOWER EXTREMITY N/A 04/25/2019   Procedure: ABDOMINAL AORTOGRAM W/LOWER EXTREMITY;  Surgeon: [name], MD;  Location: [place] [facility];  Service: Cardiovascular;   Laterality: N/A;   ABDOMINAL AORTOGRAM W/LOWER EXTREMITY Left 07/18/2019   Procedure: ABDOMINAL AORTOGRAM W/LOWER EXTREMITY;  Surgeon: [name], MD;  Location: [place] [facility];  Service: Cardiovascular;  Laterality: Left;   ABDOMINAL AORTOGRAM W/LOWER EXTREMITY N/A 11/16/2021   Procedure: ABDOMINAL AORTOGRAM W/LOWER EXTREMITY;  Surgeon: [name], MD;  Location: [facility];  Service: Cardiovascular;  Laterality: N/A;   ABDOMINOPLASTY     ADENOIDECTOMY     APPENDECTOMY  1995   AV FISTULA PLACEMENT Left 02/01/2017   Procedure: ARTERIOVENOUS BRACHIOCEPHALIC (AV) FISTULA CREATION;  Surgeon: [name], MD;  Location: [facility];  Service: Vascular;  Laterality: Left;   [place] INTERVENTION N/A 08/05/2016   Procedure: Coronary Stent Intervention;  Surgeon: [name], MD;  Location: [place] [facility];  Service: Cardiovascular;  Laterality: N/A;   ESOPHAGOGASTRODUODENOSCOPY (EGD) WITH PROPOFOL N/A 07/01/2020   Procedure: ESOPHAGOGASTRODUODENOSCOPY (EGD) WITH PROPOFOL;  Surgeon: Gatha Mayer, MD;  Location: [place];  Service: Endoscopy;  Laterality: N/A;   EXPLORATORY LAPAROTOMY  08/14/2005   lysis of adhesions, drainage of tubo-ovarian abscess   FISTULA SUPERFICIALIZATION Left 09/14/2017   Procedure: FISTULA SUPERFICIALIZATION ARTERIOVENOUS FISTULA LEFT ARM;  Surgeon: [name], MD;  Location: [place];  Service: Vascular;  Laterality: Left;   FLEXIBLE SIGMOIDOSCOPY N/A 01/04/2019   Procedure: FLEXIBLE SIGMOIDOSCOPY;  Surgeon: Clarene Essex, MD;  Location: [facility];  Service: Endoscopy;  Laterality: N/A;   HYDRADENITIS EXCISION  02/08/2011   Procedure: EXCISION HYDRADENITIS GROIN;  Surgeon: [name], MD;  Location: [place];  Service: General;  Laterality: N/A;  Excisioin of Hidradenitis Left groin   INCISION AND DRAINAGE ABSCESS N/A  01/11/2019   Procedure: INCISION AND DRAINAGE SACRAL ABSCESS;  Surgeon: Georganna Skeans, MD;  Location: [place];  Service: General;  Laterality: N/A;   INGUINAL HIDRADENITIS EXCISION  07/06/2010   bilateral   INSERTION OF DIALYSIS CATHETER N/A 09/14/2017   Procedure: INSERTION OF TUNNELED DIALYSIS CATHETER Right Internal Jugular;  Surgeon: [name], MD;  Location: [place];  Service: Vascular;  Laterality: N/A;   IR FLUORO GUIDE CV LINE RIGHT  03/01/2019   IR US GUIDE BX ASP/DRAIN  01/01/2019   IR US GUIDE VASC ACCESS RIGHT  03/01/2019   LEFT HEART CATH AND CORONARY ANGIOGRAPHY N/A 08/05/2016   Procedure: Left Heart Cath and Coronary Angiography;  Surgeon: [name], MD;  Location: [facility] [facility];  Service: Cardiovascular;  Laterality: N/A;   PERIPHERAL VASCULAR INTERVENTION Left 12/14/2018   Procedure: PERIPHERAL VASCULAR INTERVENTION;  Surgeon: [name], MD;  Location: [place] [facility];  Service: Cardiovascular;  Laterality: Left;  SFA   PERIPHERAL VASCULAR INTERVENTION Left 04/25/2019   Procedure: PERIPHERAL VASCULAR INTERVENTION;  Surgeon: [name], MD;  Location: [place] [facility];  Service: Cardiovascular;  Laterality: Left;  right superficial femoral, and left iliac   PERIPHERAL VASCULAR INTERVENTION Left 07/18/2019   Procedure: PERIPHERAL VASCULAR INTERVENTION;  Surgeon: [name], MD;  Location: [place] [place] [facility];  Service: Cardiovascular;  Laterality: Left;  external iliac   REDUCTION MAMMAPLASTY  2002   TONSILLECTOMY     TUBAL LIGATION  1996   VAGINAL HYSTERECTOMY  08/04/2005   and cysto    Assessment & Plan Clinical Impression: Patient is a 51 y.o. year old female with history of end-stage renal disease with hemodialysis, CAD with stenting maintained on aspirin and Plavix, hypertension, diastolic and systolic congestive heart failure, diabetes mellitus peripheral neuropathy, lower extremity DVT, asthma/quit smoking 5 years ago,  peripheral vascular disease with history of left external iliac artery and left SFA stenting.  She underwent recent excision of tumoral calcinosis left hip/thigh, drain and sutures removed 10/20/2021 without evidence of infection per office notes at [facility].  Per chart review use of straight point cane prior to admission.  1 level home.  Plans to stay with her sister on discharge.  Presented 11/08/2021 with progressive weakness and fall since 11/06/2021.  On arrival she is found to be febrile as well as tachycardic with mild altered mental status.  She was admitted to the ICU for possible sepsis and placed on broad-spectrum antibiotics 11/08/2021 - 11/16/2021.  Cranial CT scan negative.  Chest x-ray showed vague hazy opacity right mid and lower lung worrisome for early developing infiltrate/pneumonia.  She did have some noted drainage from the left hip.  X-rays unilateral with and without contrast of pelvis showed no fracture or dislocation identified.  There was a large area of soft tissue calcification previously described as tumoral calcinosis measuring 21 x 15 cm versus 21 x 14 on the comparison study.  CT of left hip showed extensive calcification with areas layering hyperdense sediment involving the proximal extensor, gluteal and flexor compartment muscles of the left thigh with overall appearance most consistent with tumoral calcinosis.  Soft tissue edema in the subcutaneous fat  along the proximal lateral hip and thigh reflecting mild cellulitis.  Admission chemistries potassium 6.9 BUN 117 creatinine 10.68, WBC 12,800, hemoglobin 8.8, SARS coronavirus negative, lactic acid 2.0-2.5, procalcitonin 9.57.  Echocardiogram with ejection fraction of 45 to 50% right ventricular systolic function moderately reduced.  Nephrology consulted with hemodialysis ongoing.  Vascular surgery consulted 11/14/2021 due to left foot tenderness and known peripheral arterial disease and concern for occlusion of her stents..   Aortogram completed undergoing stent of right common iliac artery as well as left common iliac artery with drug-coated balloon angioplasty left common femoral artery and stent of left SFA 11/16/2021 per Dr. [name].  Patient continues to have some leukocytosis with all antibiotic treatment completed 11/08/2021 - 11/16/2021 and lattest WBC 20,200.  Initially concern for possible bacteremia in patient with vascular access on hemodialysis.  Cultures done after antibiotics showing no growth to date and patient remains afebrile.  He remains on Plavix and aspirin as prior to admission.  Subcutaneous heparin was added for DVT prophylaxis.  Patient with abrupt onset bilateral sensorineural hearing loss confirmed by audiology 11/7 though symptoms have improved and recommendations follow-up outpatient.  Therapy evaluations completed due to patient decreased functional mobility was admitted for a comprehensive rehab program.    Patient currently requires max with mobility secondary to muscle weakness and muscle joint tightness, decreased cardiorespiratoy endurance, unbalanced muscle activation and decreased coordination, and decreased standing balance, decreased postural control, and decreased balance strategies.  Prior to hospitalization, patient was modified independent  with mobility and lived with Alone in a House home.  Home access is 2Stairs to enter.  Patient will benefit from skilled PT intervention to maximize safe functional mobility, minimize fall risk, and decrease caregiver burden for planned discharge home with 24 hour supervision.  Anticipate patient will benefit from follow up [place] at discharge.  PT - End of Session Activity Tolerance: Tolerates 30+ min activity with multiple rests Endurance Deficit: Yes Endurance Deficit Description: required frequent rest breaks PT Assessment Rehab Potential (ACUTE/IP ONLY): Good PT Barriers to Discharge: [place] home environment;Home environment  access/layout;Wound Care;Hemodialysis;Other (comments) PT Barriers to Discharge Comments: pain, 2 STE with 0 rails, leaking L hip wound, complex medical hx PT Patient demonstrates impairments in the following area(s): Balance;Edema;Endurance;Motor;Nutrition;Pain;Sensory;Skin Integrity PT Transfers Functional Problem(s): Bed Mobility;Bed to Chair;Car;Furniture PT Locomotion Functional Problem(s): Ambulation;Wheelchair Mobility PT Plan PT Intensity: Minimum of 1-2 x/day ,45 to 90 minutes PT Frequency: 5 out of 7 days PT Duration Estimated Length of Stay: 14-18 days PT Treatment/Interventions: Ambulation/gait training;Discharge planning;Functional mobility training;Psychosocial support;Therapeutic Activities;Visual/perceptual remediation/compensation;Balance/vestibular training;Disease management/prevention;Neuromuscular re-education;Skin care/wound management;Therapeutic Exercise;Wheelchair propulsion/positioning;DME/adaptive equipment instruction;Cognitive remediation/compensation;Pain management;Splinting/orthotics;UE/LE Strength taining/ROM;Community reintegration;Functional electrical stimulation;Patient/family education;Stair training;UE/LE Coordination activities PT Transfers Anticipated Outcome(s): supervision with LRAD PT Locomotion Anticipated Outcome(s): supervision with LRAD PT Recommendation Recommendations for Other Services: Therapeutic Recreation consult Therapeutic Recreation Interventions: Stress management Follow Up Recommendations: Home health PT Patient destination: Home Equipment Recommended: To be determined Equipment Details: has SPC, RW, and hoveraround scooter   PT Evaluation Precautions/Restrictions Precautions Precautions: Fall Precaution Comments: L foot drop, bilateral intermittent hearing loss Restrictions Weight Bearing Restrictions: No Pain Interference Pain Interference Pain Effect on Sleep: 4. Almost constantly Pain Interference with Therapy Activities:  4. Almost constantly Pain Interference with Day-to-Day Activities: 4. Almost constantly Home Living/Prior Functioning Home Living Living Arrangements: Other relatives (Lives with sister) Available Help at Discharge: Family;Available 24 hours/day (pt reports her sister and daughter will come stay with her upon D/C) Type of Home: House Home Access: Stairs to enter Entrance Stairs-Number  of Steps: 2 Entrance Stairs-Rails: None Home Layout: One level Bathroom Shower/Tub: Government social research officer Accessibility: Yes Additional Comments: pt reports using SPC prior to [place] and also having a RW and a hoveraround scooter  Lives With: Alone Prior Function Level of Independence: Requires assistive device for independence  Able to Take Stairs?: Yes Driving: Yes Vision/Perception  Vision - History Ability to See in Adequate Light: 0 Adequate  Cognition Overall Cognitive Status: Within Functional Limits for tasks assessed Arousal/Alertness: Awake/alert Orientation Level: Oriented X4 Memory: Appears intact Awareness: Appears intact Problem Solving: Appears intact Safety/Judgment: Appears intact Sensation Sensation Light Touch: Impaired Detail Proprioception: Appears Intact Additional Comments: WFL on RLE. Pt reported tingling along L S1 dermatome and absent sensation along L great toe, L lateral malleoli, and plantar aspect of L foot. Pt reports being unable to tolerate wearing sock on LLE due to hypersensitivity. Coordination Gross Motor Movements are Fluid and Coordinated: No Fine Motor Movements are Fluid and Coordinated: No Coordination and Movement Description: grossly uncoordinated due to L foot pain,L foot drop, generalized weakness/deconditioning, fatigue, and decreased balance strategies Finger Nose Finger Test: slow bilaterally Heel Shin Test: WFL on RLE, decreased ROM on LLE Motor  Motor Motor: Other (comment) Motor - Skilled Clinical  Observations: grossly uncoordinated due to L foot pain, L foot drop, generalized weakness/deconditioning, fatigue, and decreased balance strategies  Trunk/Postural Assessment  Cervical Assessment Cervical Assessment: Exceptions to [facility] (forward head) Thoracic Assessment Thoracic Assessment: Exceptions to [facility] (mild kyphosis) Lumbar Assessment Lumbar Assessment: Exceptions to [facility] (posterior pelvic tilt) Postural Control Postural Control: Deficits on evaluation Righting Reactions: delayed  Balance Balance Balance Assessed: Yes Static Sitting Balance Static Sitting - Balance Support: Feet supported;Bilateral upper extremity supported Static Sitting - Level of Assistance: 6: Modified independent (Device/Increase time) Dynamic Sitting Balance Dynamic Sitting - Balance Support: Feet supported;No upper extremity supported Dynamic Sitting - Level of Assistance: 5: Stand by assistance (supervision) Static Standing Balance Static Standing - Balance Support: Bilateral upper extremity supported;During functional activity (RW) Static Standing - Level of Assistance: 4: Min assist Dynamic Standing Balance Dynamic Standing - Balance Support: Bilateral upper extremity supported;During functional activity (RW) Dynamic Standing - Level of Assistance: 3: Mod assist Dynamic Standing - Comments: with transfers Extremity Assessment  RLE Assessment RLE Assessment: Exceptions to [facility] RLE Strength Right Hip Flexion: 3+/5 Right Hip ABduction: 3+/5 Right Hip ADduction: 3+/5 Right Knee Flexion: 3+/5 Right Knee Extension: 3+/5 Right Ankle Dorsiflexion: 3/5 Right Ankle Plantar Flexion: 3/5 LLE Assessment LLE Assessment: Exceptions to [facility] LLE Strength Left Hip Flexion: 3+/5 Left Hip ABduction: 3+/5 Left Hip ADduction: 3+/5 Left Knee Flexion: 3/5 Left Knee Extension: 3/5 Left Ankle Dorsiflexion: 0/5 Left Ankle Plantar Flexion: 1/5  Care Tool Care Tool Bed Mobility Roll left and right activity   Roll  left and right assist level: Supervision/Verbal cueing    Sit to lying activity        Lying to sitting on side of bed activity   Lying to sitting on side of bed assist level: the ability to move from lying on the back to sitting on the side of the bed with no back support.: Contact Guard/Touching assist     Care Tool Transfers Sit to stand transfer   Sit to stand assist level: Maximal Assistance - Patient 25 - 49%    Chair/bed transfer   Chair/bed transfer assist level: Moderate Assistance - Patient 50 - 74%     Toilet transfer  Scientist, product/process development transfer activity did not occur: Safety/medical concerns (pain, fatigue, weakness/deconditioning, decreased balance)        Care Tool Locomotion Ambulation Ambulation activity did not occur: Safety/medical concerns (pain, fatigue, weakness/deconditioning, decreased balance)        Walk 10 feet activity Walk 10 feet activity did not occur: Safety/medical concerns (pain, fatigue, weakness/deconditioning, decreased balance)       Walk 50 feet with 2 turns activity Walk 50 feet with 2 turns activity did not occur: Safety/medical concerns (pain, fatigue, weakness/deconditioning, decreased balance)      Walk 150 feet activity Walk 150 feet activity did not occur: Safety/medical concerns (pain, fatigue, weakness/deconditioning, decreased balance)      Walk 10 feet on uneven surfaces activity Walk 10 feet on uneven surfaces activity did not occur: Safety/medical concerns (pain, fatigue, weakness/deconditioning, decreased balance)      Stairs Stair activity did not occur: Safety/medical concerns (pain, fatigue, weakness/deconditioning, decreased balance)        Walk up/down 1 step activity Walk up/down 1 step or curb (drop down) activity did not occur: Safety/medical concerns (pain, fatigue, weakness/deconditioning, decreased balance)      Walk up/down 4 steps activity Walk up/down 4 steps activity did not occur: Safety/medical  concerns (pain, fatigue, weakness/deconditioning, decreased balance)      Walk up/down 12 steps activity Walk up/down 12 steps activity did not occur: Safety/medical concerns (pain, fatigue, weakness/deconditioning, decreased balance)      Pick up small objects from floor Pick up small object from the floor (from standing position) activity did not occur: Safety/medical concerns (pain, fatigue, weakness/deconditioning, decreased balance)      Wheelchair Is the patient using a wheelchair?: Yes Type of Wheelchair: Manual   Wheelchair assist level: Supervision/Verbal cueing Max wheelchair distance: 30f  Wheel 50 feet with 2 turns activity   Assist Level: Supervision/Verbal cueing  Wheel 150 feet activity   Assist Level: Moderate Assistance - Patient 50 - 74%    Refer to Care Plan for Long Term Goals  SHORT TERM GOAL WEEK 1 PT Short Term Goal 1 (Week 1): pt will transfer sit<>stand with LRAD and mod A consistantly PT Short Term Goal 2 (Week 1): pt will transfer bed<>chair with LRAD and min A PT Short Term Goal 3 (Week 1): pt will ambulate 514fwith LRAD and mod A  Recommendations for other services: Therapeutic Recreation  Stress management  Skilled Therapeutic Intervention Evaluation completed (see details above and below) with education on PT POC and goals and individual treatment initiated with focus on functional mobility/transfers, dressing, dynamic standing balance/coordination, and WC mobility. Received pt semi-reclined in bed with RN present changing dressing. Pt educated on PT evaluation, CIR policies, and therapy schedule and agreeable. Pt reported pain 7/10 in L foot (premedicated). Provided pt with scrub clothes while pt performed peri-care with set up assist. Pt rolled L/R with supervision and required max A to don clean brief. Donned scrub pants in supine with mod A and pt transferred semi-reclined<>sitting EOB with HOB elevated and use of bedrails with CGA. Doffed gown and  donned clean scrub top with supervision. Located new RW and adjusted height and pt transferred sit<>stand with RW and max A from EOB. Pt required total A to pull pants over hips and transferred bed<>WC stand<>pivot with RW and mod A - however, pt unable to stand tall and extend elbows, instead leaning with forearms over walker and avoiding placing any weight on L foot due to pain. Wound on L hip began  leaking when standing - RN notified. Placed towel roll on L footrest for comfort and pt performed WC mobility 50f x 1, and 25 ft x 2 trials using BUE and supervision with emphasis on UE strength and cardiovascular endurance - pt required rest breaks in between. Returned to room and concluded session with pt sitting in WC, needs within reach, and seatbelt alarm on. Safety plan updated.    Mobility Bed Mobility Bed Mobility: Rolling Right;Rolling Left;Supine to Sit Rolling Right: Supervision/verbal cueing Rolling Left: Supervision/Verbal cueing Supine to Sit: Contact Guard/Touching assist Transfers Transfers: Sit to Stand;Stand to Sit;Stand Pivot Transfers Sit to Stand: Maximal Assistance - Patient 25-49% Stand to Sit: Moderate Assistance - Patient 50-74% Stand Pivot Transfers: Moderate Assistance - Patient 50 - 74% Stand Pivot Transfer Details: Verbal cues for sequencing;Verbal cues for technique;Verbal cues for precautions/safety;Verbal cues for safe use of DME/AE Stand Pivot Transfer Details (indicate cue type and reason): verbal cues to stand tall and extend elbows rather than flexing forward over walker and leaning on elbows. Also encouraged pt to place weight through LLE. Transfer (Assistive device): Rolling walker Locomotion  Gait Ambulation: No Gait Gait: No Stairs / Additional Locomotion Stairs: No WArchitect Yes Wheelchair Assistance: SChartered loss adjuster Both upper extremities Wheelchair Parts Management: Needs  assistance Distance: 599f  Discharge Criteria: Patient will be discharged from PT if patient refuses treatment 3 consecutive times without medical reason, if treatment goals not met, if there is a change in medical status, if patient makes no progress towards goals or if patient is discharged from hospital.  The above assessment, treatment plan, treatment alternatives and goals were discussed and mutually agreed upon: by patient  AnAlfonse AlpersT, DPT  11/24/2021, 12:15 PM

## 2021-11-24 NOTE — Progress Notes (Signed)
PROGRESS NOTE   Subjective/Complaints: BP soft +pruritus Some pain in left foot, well controlled with pain medications  ROS: +pruritis   Objective:   No results found. Recent Labs    11/23/21 0550 11/24/21 0034  WBC 20.2* 22.2*  HGB 7.9* 7.3*  HCT 27.7* 25.9*  PLT 525* 508*   Recent Labs    11/23/21 0550 11/24/21 0034  NA 137 137  K 3.7 4.2  CL 96* 94*  CO2 26 25  GLUCOSE 110* 120*  BUN 39* 19  CREATININE 5.72* 3.08*  CALCIUM 9.2 9.1    Intake/Output Summary (Last 24 hours) at 11/24/2021 0943 Last data filed at 11/24/2021 0835 Gross per 24 hour  Intake 120 ml  Output --  Net 120 ml        Physical Exam: Vital Signs Blood pressure (!) 106/50, pulse 86, temperature 98.2 F (36.8 C), temperature source Oral, resp. rate 16, height '5\' 5"'$  (1.651 m), weight 61.3 kg, SpO2 100 %. Gen: no distress, normal appearing HEENT: oral mucosa pink and moist, NCAT Cardio: Reg rate Chest: normal effort, normal rate of breathing Abd: soft, non-distended Ext: no edema Psych: pleasant, normal affect Musculoskeletal:     Cervical back: Normal range of motion.     Comments: Left hip tender with ROM, appears swollen. Large dressing in place. Left foot hypersensitive to touch.  Skin:    Comments: Left hip Xeroform dressing in place.  2 incisions left posterior lateral thigh without odor but with yellowish/tan drainage with clumpy, white debris. No odor. Wounds themselves appear pink without necrotic tissue. Left foot: tip of great toe and 2nd/third toes appear ischemic. AVF in LUE  Neurological:     Mental Status: She is alert.     Comments:    Alert and oriented x 3. Normal insight and awareness. Intact Memory. Normal language and speech. Cranial nerve exam unremarkable. UE grossly 5/5. RLE 3+ to 4/5 prox to 4+/5 distally. LLE 2/5 prox to trace/5 ankle. Stocking glove sensory loss bilateral LE, L>R up to mid calf.    Psychiatric:        Mood and Affect: Mood normal.        Behavior: Behavior normal.    Assessment/Plan: 1. Functional deficits which require 3+ hours per day of interdisciplinary therapy in a comprehensive inpatient rehab setting. Physiatrist is providing close team supervision and 24 hour management of active medical problems listed below. Physiatrist and rehab team continue to assess barriers to discharge/monitor patient progress toward functional and medical goals  Care Tool:  Bathing              Bathing assist       Upper Body Dressing/Undressing Upper body dressing        Upper body assist      Lower Body Dressing/Undressing Lower body dressing            Lower body assist       Toileting Toileting    Toileting assist       Transfers Chair/bed transfer  Transfers assist           Locomotion Ambulation   Ambulation assist  Walk 10 feet activity   Assist           Walk 50 feet activity   Assist           Walk 150 feet activity   Assist           Walk 10 feet on uneven surface  activity   Assist           Wheelchair     Assist               Wheelchair 50 feet with 2 turns activity    Assist            Wheelchair 150 feet activity     Assist          Blood pressure (!) 106/50, pulse 86, temperature 98.2 F (36.8 C), temperature source Oral, resp. rate 16, height '5\' 5"'$  (1.651 m), weight 61.3 kg, SpO2 100 %.    Medical Problem List and Plan: 1. Functional deficits secondary to septic shock/metabolic with debility and encephalopathy. Pt appears cognitively to be at baseline. -  Antibiotic therapy 10/29 - 11/6             -patient may shower if left hip is covered             -ELOS/Goals: 10-12 days, mod I to supervision goals 2.  Antithrombotics: -DVT/anticoagulation:  Pharmaceutical: Heparin             -antiplatelet therapy: Aspirin 81 mg daily and Plavix  75 mg daily 3. Pain Management: Lidoderm patch, oxycodone as needed 4. Mood/Behavior/Sleep: Provide emotional support             -antipsychotic agents: N/A 5. Neuropsych/cognition: This patient is capable of making decisions on her own behalf. 6. Skin/Wound Care: Continue BID to TID dressing changes to left hip. -WOC follow-up for left hip drainage. -completed course of broad spectrum abx on 11/6   -Assistance provided by orthopedics. 7. Fluids/Electrolytes/Nutrition: Routine in and outs with follow-up chemistries 8.  Peripheral vascular disease.  Follow-up vascular surgery for occluded stents status post right common iliac left common iliac stenting with balloon angioplasty left common femoral and stent left SFA 11/16/2021 per Dr. [name] 9.  Tumoral calcinosis.  Recent excision of tumoral calcinosis left hip [facility] 10/20/2021.  Weightbearing as tolerated 10.  End-stage renal disease.  Continue hemodialysis as directed. Discussed improved Creatinine to 3.08 on 11/14.  11.  Acute on chronic anemia.  Continue iron supplement 12.  Leukocytosis.  (20k)  -Broad spectrum antibiotic therapy completed 11/16/2021 and monitor.  Latest blood culture showed no growth             -appearance of drainage is concerning. Recent CT (10/30) demonstrating changes c/w tumoral calcinosis and mild cellulitis.              -consider MRI of left hip depending upon drainage, WBC, etc.              -ischemic toes LLE could also be a source             -continue wound care as above             -pt feels well otherwise and no other signs of infection             -consider H/O consult if persistent 13.  Asthma without exacerbation as well as remote history of tobacco abuse.  Continue inhalers as directed check oxygen saturations  every shift 14. Diet controlled Diabetes mellitus. Blood sugar checks discontinued 15.  Hypertension.  Toprol-XL 25 mg daily.  Monitor with increased mobility 16.  Abrupt onset  bilateral sensorineural hearing loss.  Confirmed by audiology 11/7 the symptoms have resolved.  Follow-up outpatient. 17.  Diastolic and systolic congestive heart failure.  Monitor for any signs of fluid overload. Daily weights ordered 18. Pruritus: sarna lotion ordered.   LOS: 1 days A FACE TO FACE EVALUATION WAS PERFORMED  Clide Deutscher Steve Youngberg 11/24/2021, 9:43 AM

## 2021-11-24 NOTE — Progress Notes (Signed)
[facility] Individual Statement of Services  Patient Name:  Susan Horton  Date:  11/24/2021  Welcome to the [facility].  Our goal is to provide you with an individualized program based on your diagnosis and situation, designed to meet your specific needs.  With this comprehensive rehabilitation program, you will be expected to participate in at least 3 hours of rehabilitation therapies Monday-Friday, with modified therapy programming on the weekends.  Your rehabilitation program will include the following services:  Physical Therapy (PT), Occupational Therapy (OT), Speech Therapy (ST), 24 hour per day rehabilitation nursing, Therapeutic Recreaction (TR), Neuropsychology, Care Coordinator, Rehabilitation Medicine, Nutrition Services, Pharmacy Services, and Other  Weekly team conferences will be held on Wednesdays to discuss your progress.  Your Inpatient Rehabilitation Care Coordinator will talk with you frequently to get your input and to update you on team discussions.  Team conferences with you and your family in attendance may also be held.  Expected length of stay: 10-12 Days  Overall anticipated outcome:  MOD I to Supervision  Depending on your progress and recovery, your program may change. Your Inpatient Rehabilitation Care Coordinator will coordinate services and will keep you informed of any changes. Your Inpatient Rehabilitation Care Coordinator's name and contact numbers are listed  below.  The following services may also be recommended but are not provided by the [facility]:   [place] will be made to provide these services after discharge if needed.  Arrangements include referral to agencies that provide these services.  Your insurance has been verified to be:   Medicare A & B Your primary doctor is:  Charlott Rakes, MD  Pertinent  information will be shared with your doctor and your insurance company.  Inpatient Rehabilitation Care Coordinator:  Erlene Quan, [place] or [phone]  Information discussed with and copy given to patient by: Dyanne Iha, 11/24/2021, 11:23 AM

## 2021-11-24 NOTE — Progress Notes (Signed)
Inpatient Rehabilitation  Patient information reviewed and entered into eRehab system by Sarvesh Meddaugh M. Bonner Larue, M.A., CCC/SLP, PPS Coordinator.  Information including medical coding, functional ability and quality indicators will be reviewed and updated through discharge.    

## 2021-11-25 DIAGNOSIS — R5381 Other malaise: Secondary | ICD-10-CM | POA: Diagnosis not present

## 2021-11-25 LAB — CBC WITH DIFFERENTIAL/PLATELET
Abs Immature Granulocytes: 0.14 10*3/uL — ABNORMAL HIGH (ref 0.00–0.07)
Basophils Absolute: 0.1 10*3/uL (ref 0.0–0.1)
Basophils Relative: 0 %
Eosinophils Absolute: 0.1 10*3/uL (ref 0.0–0.5)
Eosinophils Relative: 1 %
HCT: 26.7 % — ABNORMAL LOW (ref 36.0–46.0)
Hemoglobin: 7.8 g/dL — ABNORMAL LOW (ref 12.0–15.0)
Immature Granulocytes: 1 %
Lymphocytes Relative: 9 %
Lymphs Abs: 1.6 10*3/uL (ref 0.7–4.0)
MCH: 23.8 pg — ABNORMAL LOW (ref 26.0–34.0)
MCHC: 29.2 g/dL — ABNORMAL LOW (ref 30.0–36.0)
MCV: 81.4 fL (ref 80.0–100.0)
Monocytes Absolute: 1.2 10*3/uL — ABNORMAL HIGH (ref 0.1–1.0)
Monocytes Relative: 7 %
Neutro Abs: 14.2 10*3/uL — ABNORMAL HIGH (ref 1.7–7.7)
Neutrophils Relative %: 82 %
Platelets: 533 10*3/uL — ABNORMAL HIGH (ref 150–400)
RBC: 3.28 MIL/uL — ABNORMAL LOW (ref 3.87–5.11)
RDW: 20.3 % — ABNORMAL HIGH (ref 11.5–15.5)
WBC: 17.3 10*3/uL — ABNORMAL HIGH (ref 4.0–10.5)
nRBC: 0 % (ref 0.0–0.2)

## 2021-11-25 MED ORDER — OXYCODONE HCL 5 MG PO TABS
10.0000 mg | ORAL_TABLET | Freq: Once | ORAL | Status: AC
  Administered 2021-11-25: 10 mg via ORAL
  Filled 2021-11-25: qty 2

## 2021-11-25 NOTE — Progress Notes (Signed)
Occupational Therapy Session Note  Patient Details  Name: Susan Horton MRN: [id number] Date of Birth: March 27, 1970  Today's Date: 11/25/2021 OT Individual Time: 8916-9450 OT Individual Time Calculation (min): 71 min    Short Term Goals: Week 1:  OT Short Term Goal 1 (Week 1): Pt will perform STS transfers with Mod A + LRAD in preparation ADL tasks. OT Short Term Goal 2 (Week 1): Pt will engage in standing table-top activities >2 minutes with Mod A + LRAD in preparation for ADL tasks. OT Short Term Goal 3 (Week 1): Pt will demonstrate carry-over of LB compensatory dressing techniques with supervision.  Skilled Therapeutic Interventions/Progress Updates:  Skilled OT intervention completed with focus on functional transfers, tolerance with WB on LLE, dynamic balance with unilateral UE support. Pt received in R side-lying in bed, agreeable to session. Did not indicate rated pain, however with transfers and prolonged sitting LLE discomfort though with rest breaks pt tolerated well. Pre-medicated.  Pt indicated she needed her L hip dressing changed due to leakage, and preferred not to move until it was changed. Checked in with nursing with plan for change when able. Pt agreeable to bed level exercises in mean time. Was able to complete the following with bed rail as anchor with red band x10: chest presses, tricep extension. Nurse in room to change bandage, with therapist assisting with pants management and placement of bandages due to LLE sensitivity.  Completed bed mobility with increased time with supervision to EOB with HOB slightly elevated. Mod A sit > stand using RW, then mod A stand pivot to w/c. Transported dependently in w/c <> gym for time.  Mod A sit > stand using RW, then min A stand pivot to EOM. Completed series of sit > stands with mat slightly elevated with heavy min A using RW, then was able to maintain stance using RW with about 50% WB tolerance on LLE during retrieval/placement x2 of  squigz on long mirror with CGA for balance. Did demonstrate trunk flexion when using LUE to incorporate into task due to balance strategies on LLE with pt trying to take weight off of it but no formal LOB. However able to maintain stance for greater than 5 mins.  Seated EOM, completed the following exercises with red theraband: -Horizontal shoulder abduction x10 -Bicep flexion x10 each arm -Shoulder external rotation x10  Back in room, pt preferred to stay seated in w/c for lunch, with all needs in reach at end of session.   Therapy Documentation Precautions:  Precautions Precautions: Fall Precaution Comments: L foot drop, bilateral intermittent hearing loss Restrictions Weight Bearing Restrictions: No    Therapy/Group: Individual Therapy  Blase Mess, MS, OTR/L  11/25/2021, 12:02 PM

## 2021-11-25 NOTE — Progress Notes (Signed)
Physical Therapy Session Note  Patient Details  Name: Susan Horton MRN: [id number] Date of Birth: 1970-06-11  Today's Date: 11/25/2021 PT Individual Time: 0730-0825 PT Individual Time Calculation (min): 55 min   Short Term Goals: Week 1:  PT Short Term Goal 1 (Week 1): pt will transfer sit<>stand with LRAD and mod A consistantly PT Short Term Goal 2 (Week 1): pt will transfer bed<>chair with LRAD and min A PT Short Term Goal 3 (Week 1): pt will ambulate 82f with LRAD and mod A  Skilled Therapeutic Interventions/Progress Updates:   Received pt semi-reclined in bed getting ready to eat breakfast - encouraged getting into WC to eat for improved digestion and pt agreed. Pt agreeable to PT treatment and reported pain 9/10 in L foot (premedicated). Pt also reported new blister on top of L foot (covered by dressing). Session with emphasis on functional mobility/transfers, generalized strengthening and endurance, and standing tolerance. Pt transferred semi-reclined<>sitting EOB with HOB elevated and use of bedrails with supervision and increased time. Pt stood with RW from elevated EOB with RW and heavy mod A (but prefers to pull up on RW despite cues). Pt transferred bed<>WC stand<>pivot with RW and mod A; however this time pt able to stand up tall and extend elbows and place some weight through L foot. Sat in WC to eat breakfast and upon returning with drink for pt, pt with multiple episodes of emesis, puking up medications - RN alerted and present to administer medication. Discussed taking away pocket talker as pt did not need it with 2nd and 3rd therapy sessions yesterday and not wearing it this morning, however pt requested to keep it, stating her hearing goes in/out - replaced acute care's pocket talker with rehab's. Pt requested to work on sit[place]stands in [place] due to high pain levels. [place] to get to stedy flaps. Worked on sit<>stands from stedy flaps x 8 reps with  CGA initially fading to close supervision. Once standing, pt able to let go with arms and place ~40-50% weight through LLE and was able to progress to lifting RLE x 1, very briefly. Concluded session with pt sitting in WC, needs within reach, and seatbelt alarm on awaiting upcoming OT session.   Therapy Documentation Precautions:  Precautions Precautions: Fall Precaution Comments: L foot drop, bilateral intermittent hearing loss Restrictions Weight Bearing Restrictions: No  Therapy/Group: Individual Therapy AAlfonse AlpersPT, DPT  11/25/2021, 6:50 AM

## 2021-11-25 NOTE — Progress Notes (Signed)
Received patient in bed to unit.  Alert and oriented.  Informed consent signed and in chart.   Treatment initiated: 1309 Treatment completed: 1630  Patient tolerated well. Pt opted to end tx early. Transported back to the room  Alert, without acute distress.  Hand-off given to patient's nurse.   Access used: AVF Access issues: none  Total UF removed: 1.5L Medication(s) given: Hectorol Post HD VS: 97.4,81,13,112/53,99% Post HD weight: 61.1kg   [facility]

## 2021-11-25 NOTE — Progress Notes (Signed)
Occupational Therapy Session Note  Patient Details  Name: Susan Horton MRN: [id number] Date of Birth: Jul 28, 1970  Today's Date: 11/25/2021 OT Individual Time: 2575-0518 OT Individual Time Calculation (min): 60 min    Short Term Goals: Week 1:  OT Short Term Goal 1 (Week 1): Pt will perform STS transfers with Mod A + LRAD in preparation ADL tasks. OT Short Term Goal 2 (Week 1): Pt will engage in standing table-top activities >2 minutes with Mod A + LRAD in preparation for ADL tasks. OT Short Term Goal 3 (Week 1): Pt will demonstrate carry-over of LB compensatory dressing techniques with supervision.  Skilled Therapeutic Interventions/Progress Updates:  Pt received seated in [facility] for skilled OT session with focus on ADL retraining and functional transfers. Pt agreeable to interventions, demonstrating overall pleasant mood. Pt reported 9/10 pain, however able to participate in session with little to no complaints of pain. OT offered intermediate rest breaks and positioning suggestions throughout session to address pain/fatigue and maximize participation/safety in session.   Pt performed sink-level bathing tasks with overall set-up A + Vcs for compensatory strategies. Pt standing sink-level with use of sink edge for UE support and maintaining balance, requiring fluctuating levels of A for initial lift-off of WC. Pt able to unthread/thread scrub pants while seated on WC with Vcs for use of figure-4 technique, but dependent for doffing/donning over bottom due to bilat UE involvement in maintaining standing balance/support. Pt requiring intermediate rest breaks due to decreased activity tolerance and fatigue post standing tasks. Pt continuing to benefit from Vcs for use of and weight shifting onto L foot, comparing her sensation to phantom limb symptoms during conversation. Pt able to perform stand-step onto EOB with Min A + RW + Vcs to look at her L foot to improve control/movement.   Pt remained supine  in bed with all immediate needs meet at end of session. Pt continues to be appropriate for skilled OT intervention to address functional mobility and activity tolerance promoting further functional independence.    Therapy Documentation Precautions:  Precautions Precautions: Fall Precaution Comments: L foot drop, bilateral intermittent hearing loss Restrictions Weight Bearing Restrictions: No    Therapy/Group: Individual Therapy  Maudie Mercury, OTR/L, MSOT   11/25/2021, 9:57 AM

## 2021-11-25 NOTE — Progress Notes (Signed)
PROGRESS NOTE   Subjective/Complaints: Patient's chart reviewed- No issues reported overnight Vitals signs stable  WBC improved to 17.3  ROS: +pruritus, +foot pain well controlled. +left hip pain 9/10   Objective:   No results found. Recent Labs    11/24/21 0034 11/25/21 0645  WBC 22.2* 17.3*  HGB 7.3* 7.8*  HCT 25.9* 26.7*  PLT 508* 533*   Recent Labs    11/23/21 0550 11/24/21 0034  NA 137 137  K 3.7 4.2  CL 96* 94*  CO2 26 25  GLUCOSE 110* 120*  BUN 39* 19  CREATININE 5.72* 3.08*  CALCIUM 9.2 9.1    Intake/Output Summary (Last 24 hours) at 11/25/2021 0728 Last data filed at 11/24/2021 1836 Gross per 24 hour  Intake 360 ml  Output --  Net 360 ml        Physical Exam: Vital Signs Blood pressure (!) 123/58, pulse 77, temperature 98.5 F (36.9 C), temperature source Oral, resp. rate 20, height '5\' 5"'$  (1.651 m), weight 61.9 kg, SpO2 100 %. Gen: no distress, normal appearing, BMI 22.71 HEENT: oral mucosa pink and moist, NCAT Cardio: Reg rate Chest: normal effort, normal rate of breathing Abd: soft, non-distended Ext: no edema Psych: pleasant, normal affect Musculoskeletal:     Cervical back: Normal range of motion.     Comments: Left hip tender with ROM, appears swollen. Large dressing in place. Left foot hypersensitive to touch.  Skin:    Comments: Left hip Xeroform dressing in place.  2 incisions left posterior lateral thigh without odor but with yellowish/tan drainage with clumpy, white debris. No odor. Wounds themselves appear pink without necrotic tissue. Left foot: tip of great toe and 2nd/third toes appear ischemic. AVF in LUE  Neurological:     Mental Status: She is alert.     Comments:    Alert and oriented x 3. Normal insight and awareness. Intact Memory. Normal language and speech. Cranial nerve exam unremarkable. UE grossly 5/5. RLE 3+ to 4/5 prox to 4+/5 distally. LLE 2/5 prox to trace/5  ankle. Stocking glove sensory loss bilateral LE, L>R up to mid calf.   Psychiatric:        Mood and Affect: Mood normal.        Behavior: Behavior normal.    Assessment/Plan: 1. Functional deficits which require 3+ hours per day of interdisciplinary therapy in a comprehensive inpatient rehab setting. Physiatrist is providing close team supervision and 24 hour management of active medical problems listed below. Physiatrist and rehab team continue to assess barriers to discharge/monitor patient progress toward functional and medical goals  Care Tool:  Bathing    Body parts bathed by patient: Right arm, Left arm, Chest, Abdomen, Front perineal area, Right upper leg, Left upper leg, Right lower leg, Left lower leg, Face   Body parts bathed by helper: Buttocks     Bathing assist Assist Level: Minimal Assistance - Patient > 75% Assistive Device Comment: Sara Steady and shower chair   Upper Body Dressing/Undressing Upper body dressing   What is the patient wearing?: Pull over shirt    Upper body assist Assist Level: Set up assist (Seated on toilet/shower chair)    Lower  Body Dressing/Undressing Lower body dressing      What is the patient wearing?: Pants     Lower body assist Assist for lower body dressing: Moderate Assistance - Patient 50 - 74%     Toileting Toileting    Toileting assist Assist for toileting: Maximal Assistance - Patient 25 - 49% (Steady)     Transfers Chair/bed transfer  Transfers assist     Chair/bed transfer assist level: Moderate Assistance - Patient 50 - 74%     Locomotion Ambulation   Ambulation assist   Ambulation activity did not occur: Safety/medical concerns (pain, fatigue, weakness/deconditioning, decreased balance)          Walk 10 feet activity   Assist  Walk 10 feet activity did not occur: Safety/medical concerns (pain, fatigue, weakness/deconditioning, decreased balance)        Walk 50 feet activity   Assist Walk  50 feet with 2 turns activity did not occur: Safety/medical concerns (pain, fatigue, weakness/deconditioning, decreased balance)         Walk 150 feet activity   Assist Walk 150 feet activity did not occur: Safety/medical concerns (pain, fatigue, weakness/deconditioning, decreased balance)         Walk 10 feet on uneven surface  activity   Assist Walk 10 feet on uneven surfaces activity did not occur: Safety/medical concerns (pain, fatigue, weakness/deconditioning, decreased balance)         Wheelchair     Assist Is the patient using a wheelchair?: Yes Type of Wheelchair: Manual    Wheelchair assist level: Supervision/Verbal cueing Max wheelchair distance: 96f    Wheelchair 50 feet with 2 turns activity    Assist        Assist Level: Supervision/Verbal cueing   Wheelchair 150 feet activity     Assist      Assist Level: Moderate Assistance - Patient 50 - 74%   Blood pressure (!) 123/58, pulse 77, temperature 98.5 F (36.9 C), temperature source Oral, resp. rate 20, height '5\' 5"'$  (1.651 m), weight 61.9 kg, SpO2 100 %.    Medical Problem List and Plan: 1. Functional deficits secondary to septic shock/metabolic with debility and encephalopathy. Pt appears cognitively to be at baseline. -  Antibiotic therapy 10/29 - 11/6             -patient may shower if left hip is covered             -ELOS/Goals: 10-12 days, mod I to supervision goals  Continue CIR 2.  Impaired mobility: working on sit to stands -DVT/anticoagulation:  Pharmaceutical: continue Heparin given bleeding risk             -antiplatelet therapy: Aspirin 81 mg daily and Plavix 75 mg daily 3. Right foot pain: changed Lidoderm patch to bottom of right foot, oxycodone as needed 4. Mood/Behavior/Sleep: Provide emotional support             -antipsychotic agents: N/A 5. Neuropsych/cognition: This patient is capable of making decisions on her own behalf. 6. Skin/Wound Care: Continue BID to  TID dressing changes to left hip. -WOC follow-up for left hip drainage. -completed course of broad spectrum abx on 11/6   -Assistance provided by orthopedics. 7. Fluids/Electrolytes/Nutrition: Routine in and outs with follow-up chemistries 8.  Peripheral vascular disease.  Follow-up vascular surgery for occluded stents status post right common iliac left common iliac stenting with balloon angioplasty left common femoral and stent left SFA 11/16/2021 per Dr. CDonzetta Matters9.  Tumoral calcinosis.  Recent excision of  tumoral calcinosis left hip thigh at [facility] 10/20/2021.  Weightbearing as tolerated. Communicated MRI results to ortho 10.  End-stage renal disease.  Continue hemodialysis as directed. Discussed improved Creatinine to 3.08 on 11/14.  11.  Acute on chronic anemia.  Continue iron supplement 12.  Leukocytosis.  (20k)  -Broad spectrum antibiotic therapy completed 11/16/2021 and monitor.  Latest blood culture showed no growth             -appearance of drainage is concerning. Recent CT (10/30) demonstrating changes c/w tumoral calcinosis and mild cellulitis.              -consider MRI of left hip depending upon drainage, WBC, etc.              -ischemic toes LLE could also be a source             -continue wound care as above             -pt feels well otherwise and no other signs of infection             -consider H/O consult if persistent 13.  Asthma without exacerbation as well as remote history of tobacco abuse.  Continue inhalers as directed check oxygen saturations every shift 14. Diet controlled Diabetes mellitus. Blood sugar checks discontinued 15.  Hypertension.  Toprol-XL 25 mg daily.  Monitor with increased mobility 16.  Abrupt onset bilateral sensorineural hearing loss.  Confirmed by audiology 11/7 the symptoms have resolved.  Follow-up outpatient. 17.  Diastolic and systolic congestive heart failure.  Monitor for any signs of fluid overload. Daily weights ordered 18. Pruritus:  sarna lotion ordered.   LOS: 2 days A FACE TO FACE EVALUATION WAS PERFORMED  Martha Clan P Cleve Paolillo 11/25/2021, 7:28 AM

## 2021-11-25 NOTE — Progress Notes (Signed)
Pt receives out-pt HD at [facility] on MWF. Will assist as needed.   Melven Sartorius Renal Navigator [phone]

## 2021-11-25 NOTE — Progress Notes (Signed)
[facility] Progress Note   Subjective: Seen in room. No new events. No cp/dyspnea. For dialysis today.    Objective Vitals:   11/24/21 0553 11/24/21 1612 11/24/21 2100 11/25/21 0522  BP: (!) 106/50 (!) 127/52 (!) 112/46 (!) 123/58  Pulse: 86 85 74 77  Resp: '16 16 18 20  '$ Temp: 98.2 F (36.8 C) 97.6 F (36.4 C) 98.8 F (37.1 C) 98.5 F (36.9 C)  TempSrc: Oral Oral Oral Oral  SpO2: 100% 100%    Weight:    61.9 kg  Height:         Additional Objective Labs: Basic Metabolic Panel: Recent Labs  Lab 11/22/21 0111 11/23/21 0550 11/24/21 0034  NA 139 137 137  K 3.4* 3.7 4.2  CL 95* 96* 94*  CO2 '26 26 25  '$ GLUCOSE 169* 110* 120*  BUN 29* 39* 19  CREATININE 4.35* 5.72* 3.08*  CALCIUM 9.4 9.2 9.1  PHOS 6.1* 6.8* 4.2    CBC: Recent Labs  Lab 11/21/21 0101 11/22/21 0111 11/23/21 0550 11/24/21 0034 11/25/21 0645  WBC 20.4* 20.2* 20.2* 22.2* 17.3*  NEUTROABS  --   --  17.1*  --  14.2*  HGB 7.3* 7.3* 7.9* 7.3* 7.8*  HCT 25.7* 24.9* 27.7* 25.9* 26.7*  MCV 82.4 80.8 82.9 83.8 81.4  PLT 427* 490* 525* 508* 533*    Blood Culture    Component Value Date/Time   SDES WOUND HIP 11/12/2021 1425   SPECREQUEST  LEFT 11/12/2021 1425   CULT  11/12/2021 1425    NO GROWTH 2 DAYS Performed at [facility], [place] [address], [place],  [address]    REPTSTATUS 11/14/2021 FINAL 11/12/2021 1425     Physical Exam General: Well appearing, nad Heart: RRR  Lungs: Clear bilaterally  Abdomen: soft non -tender Extremities: No LE edema  Dialysis Access: L AVF +bruit   Medications:   aspirin  81 mg Oral Daily   atorvastatin  40 mg Oral Daily   Chlorhexidine Gluconate Cloth  6 each Topical Q0600   clopidogrel  75 mg Oral Q breakfast   darbepoetin (ARANESP) injection - NON-DIALYSIS  200 mcg Subcutaneous Q Wed-1800   doxercalciferol  7 mcg Intravenous Q M,W,F-HD   heparin  5,000 Units Subcutaneous Q8H   lidocaine  1 patch Transdermal Q24H    metoCLOPramide  10 mg Oral TID AC   metoprolol succinate  25 mg Oral Daily   montelukast  10 mg Oral QHS   pantoprazole  40 mg Oral Daily   thiamine  100 mg Oral Daily    Dialysis Orders:  MWF [facility] 3h 57mn 400/600  61kg  2/2 bath P2  Hep 4000  LUE AVF - mircera 200 mcg IV q2, last 10/16, due 10/30 - venofer '100mg'$  IV tiw thru 11/06 - doxercalciferol 7 ug tiw - sod thiosulfate 25 gm iv tiw  Assessment/Plan: Septic shock: Resolved. Finished course of Zosyn. Now in CIR d/t debility.  ESRD: Required CRRT on admit due to AMS, high BUN, etc. Now back to iHD and tolerating well.   Uses L AVF for dialysis.  On MWF schedule. Next HD Wed.  3. Encephalopathy: D/t shock/?meds -has resolved , but some decreased hearing and foot drop 11/7- now spontaneously improving. No offending meds identified.  4. Myoclonus: D/t meds, severe (cefepime, gabapentin and narcotics) now resolved.  5. Hyperkalemia: On admit, resolved with dialysis. 6. HTN/volume: BP controlled, euvolemic on exam but volume up per weights, but bed weight, UF as tolerated on  HD 7. Anemia of ESRD: Hgb 7s. Continue max dose Aranesp 261mg weekly while here. No IV Fe with ferritin >5000 8. Secondary HPTH: CorrCa ok, Phos not to goal. She has trouble tolerating any binder secondary to gastroparesis-we will hold binder for now and follow trend - was on Fosrenol, no iron binders with ferritin 5000 9. Nutrition: Alb low,  continue supplements sometimes refusing Nepro 10. 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 [facility] op note described 1 cup liquid being removed. Per our imaging her, size appears the same which is unfortunate. re-cultured 11/2 - NGTD. 11. T2DM with severe gastroparesis: Ongoing issue, s/p esophageal myotomy 09/17/21. Has issues swallow/keeping pills down. 12. L foot pain; Xray negative, uric acid normal. VVS  consulted, s/p LE angiography with improvement in pain  Susan ChildPA-C [facility] 11/25/2021,8:49 AM

## 2021-11-25 NOTE — Progress Notes (Signed)
Team Conference Report to Patient/Family  Team Conference discussion was reviewed with the patient and caregiver, including goals, any changes in plan of care and target discharge date.  Patient and caregiver express understanding and are in agreement.  The patient has a target discharge date of  .  SW met with patient and provided team conference updates. Patient pleased with process. Patient requesting letter to expedite the bathroom and ramp accommodations at her home. SW will provide to physician. No additional questions or concerns.  Dyanne Iha 11/25/2021, 2:27 PM

## 2021-11-26 DIAGNOSIS — R5381 Other malaise: Secondary | ICD-10-CM | POA: Diagnosis not present

## 2021-11-26 MED ORDER — CHLORHEXIDINE GLUCONATE CLOTH 2 % EX PADS
6.0000 | MEDICATED_PAD | Freq: Every day | CUTANEOUS | Status: DC
  Administered 2021-11-27: 6 via TOPICAL

## 2021-11-26 NOTE — Group Note (Signed)
Patient Details Name: Susan Horton MRN: [id number] DOB: 1970/11/18 Today's Date: 11/26/2021  Time Calculation: OT Group Time Calculation OT Group Start Time: 2440 OT Group Stop Time: 1537 OT Group Time Calculation (min): 60 min      Group Description: Stress management: Pt participated in group session with a focus on stress mgmt, education provided on healthy coping strategies, and social interaction. Focus of session on providing coping strategies to manage new diagnosis to allow for improved mental health to increase overall quality of life . Discussed how to break down stressors into "daily hassles," "major life stressors" and "life circumstances" in an effort to allow pts to chunk their stressors into groups and determine where to best put their efforts/time when dealing with stress. Provided active listening, emotional support and therapeutic use of self. Offered education on factors that protect [name] against stress such as "daily uplifts," "healthy coping strategies" and "protective factors." Encouraged all group members to make an effort to actively recall one event from their day that was a daily uplift in an effort to protect their mindset from stressors as well as sharing this information with their caregivers to facilitate improved caregiver communication and decrease overall burden of care.  Issued pt handouts on healthy coping strategies to implement into routine.   Individual level documentation: Patient participated with full collaboration during session.   Pain: No pain   [name] 11/26/2021, 4:10 PM

## 2021-11-26 NOTE — Progress Notes (Addendum)
Met with patient. Oriented to rehab. Informed of team conference every Wednesday that will discuss barriers to discharge, discharge date, goals and any other information pertaining to stay. Discussed binder and form for daily weights at home. HTN medications. Discussed drainage from her incision. Also discussed stage II to [place] that area has been there for over 2 years now and that was an area that was cut out and healed and says that opens up and will close but has been an issue with sitting since her fall.  Asked who she saw for it and only remembers that was on the corner or [place] and was [place] something. Thinks on 4th floor but was 2 years ago. Will notify PA and MD for possible follow up after discharge.    Also, says that has 2 small steps going to apartment and is supposed to have a ramp installed. Uses hover-round at home.

## 2021-11-26 NOTE — Progress Notes (Signed)
Occupational Therapy Session Note  Patient Details  Name: Susan Horton MRN: [id number] Date of Birth: [birth year]  Today's Date: 11/26/2021 OT Individual Time: 0815 -0915      Short Term Goals: Week 1:  OT Short Term Goal 1 (Week 1): Pt will perform STS transfers with Mod A + LRAD in preparation ADL tasks. OT Short Term Goal 2 (Week 1): Pt will engage in standing table-top activities >2 minutes with Mod A + LRAD in preparation for ADL tasks. OT Short Term Goal 3 (Week 1): Pt will demonstrate carry-over of LB compensatory dressing techniques with supervision.  Skilled Therapeutic Interventions/Progress Updates:  Pt received seated in [facility] for skilled OT session with focus on activity/standing tolerance, functional transfers, and weight-shifting onto L foot. Pt agreeable to interventions, demonstrating an overall uplifted mood. Pt reported 6-7/10 pain, stating "I was up most of the night. . ." in reference to overnight episodes of diarrhea. OT offering intermediate rest breaks and positioning suggestions throughout session to address pain/fatigue and maximize participation/safety in session.   Pt able to participate in dynamic balance activities at table-top targeting activity/standing tolerance and weight shifting onto L foot. Pt tolerating stance >4-5 mins with overall close CGA + RW and Mod verbal/tactile cues for posture/weight-shifting onto L foot. Pt performs multiple STS transfers during session with overall Mod A + RW (levels of A fluctuating due to fatigue towards end of session) and improving levels of recall for safe STS technique.  Pt completes 5 reps/1 set of wheelchair pushups to address UE strength and task repetition in preparation for completing functional transfers with less assistance. Pt finishes session engaging in simulated laundry task standing at table-top using both hands (with no UE support for balance) but close CGA for balance/safety. Pt able to self propel WC towards room  from therapy gym to nurses station (~115-120 ft) with 2 rest breaks due increased fatigue.   Pt remained seated in [facility] with all immediate needs meet at end of session. Pt continues to be appropriate for skilled OT intervention to address functional mobility and ADL re-training to promote further independence.    Therapy Documentation Precautions:  Precautions Precautions: Fall Precaution Comments: L foot drop, bilateral intermittent hearing loss Restrictions Weight Bearing Restrictions: No General:    Therapy/Group: Individual Therapy  Maudie Mercury, OTR/L, MSOT   11/26/2021, 7:56 AM

## 2021-11-26 NOTE — Progress Notes (Signed)
Recreational Therapy Assessment and Plan  Patient Details  Name: Susan Horton MRN: [id number] Date of Birth: 03-14-1970 Today's Date: 11/26/2021  Rehab Potential:   ELOS:     Assessment Hospital Problem: Principal Problem:   Sepsis ([place])     Past Medical History:      Past Medical History:  Diagnosis Date   Abnormal stress test     Anemia     Angio-edema     Asthma     CAD (coronary artery disease)      DES to mid LAD July 2018, residual moderate RCA disease   Cataract     CKD (chronic kidney disease) stage 4, GFR 15-29 ml/min (HCC)      Dialysis T/Th/Sa   DVT (deep venous thrombosis) ([place])      1996 during pregnancy, 2015 left leg   Eczema     Essential hypertension 12/11/2015   Gastroparesis     GERD (gastroesophageal reflux disease)     Gluteal abscess 12/27/2018   Hidradenitis     Migraine     Neuropathy     Peripheral vascular disease (HCC)      blood clot in leg   Progressive angina ([place]) 06/05/2014    Chest pain   Type 2 diabetes mellitus ([place])     Type II diabetes mellitus ([place])     Urticaria      Past Surgical History:       Past Surgical History:  Procedure Laterality Date   A/V FISTULAGRAM N/A 06/22/2017    Procedure: A/V FISTULAGRAM - left arm;  Surgeon: [name], MD;  Location: [place] [facility];  Service: Cardiovascular;  Laterality: N/A;   ABDOMINAL AORTOGRAM W/LOWER EXTREMITY Bilateral 12/14/2018    Procedure: ABDOMINAL AORTOGRAM W/LOWER EXTREMITY;  Surgeon: [name], MD;  Location: [place] [facility];  Service: Cardiovascular;  Laterality: Bilateral;   ABDOMINAL AORTOGRAM W/LOWER EXTREMITY N/A 04/25/2019    Procedure: ABDOMINAL AORTOGRAM W/LOWER EXTREMITY;  Surgeon: [name], MD;  Location: [place] [facility];  Service: Cardiovascular;  Laterality: N/A;   ABDOMINAL AORTOGRAM W/LOWER EXTREMITY Left 07/18/2019    Procedure: ABDOMINAL AORTOGRAM W/LOWER EXTREMITY;  Surgeon: [name], MD;  Location: [place] [facility];  Service: Cardiovascular;  Laterality: Left;   ABDOMINAL AORTOGRAM W/LOWER EXTREMITY N/A 11/16/2021    Procedure: ABDOMINAL AORTOGRAM W/LOWER EXTREMITY;  Surgeon: [name], MD;  Location: [place] [facility];  Service: Cardiovascular;  Laterality: N/A;   ABDOMINOPLASTY       ADENOIDECTOMY       APPENDECTOMY   1995   AV FISTULA PLACEMENT Left 02/01/2017    Procedure: ARTERIOVENOUS BRACHIOCEPHALIC (AV) FISTULA CREATION;  Surgeon: [name], MD;  Location: [facility] OR;  Service: Vascular;  Laterality: Left;   [place] INTERVENTION N/A 08/05/2016    Procedure: Coronary Stent Intervention;  Surgeon: [name], MD;  Location: [place] [facility];  Service: Cardiovascular;  Laterality: N/A;   ESOPHAGOGASTRODUODENOSCOPY (EGD) WITH PROPOFOL N/A 07/01/2020    Procedure: ESOPHAGOGASTRODUODENOSCOPY (EGD) WITH PROPOFOL;  Surgeon: Gatha Mayer, MD;  Location: [place];  Service: Endoscopy;  Laterality: N/A;   EXPLORATORY LAPAROTOMY   08/14/2005    lysis of adhesions, drainage of tubo-ovarian abscess   FISTULA SUPERFICIALIZATION Left 09/14/2017    Procedure: FISTULA SUPERFICIALIZATION ARTERIOVENOUS FISTULA LEFT ARM;  Surgeon: [name], MD;  Location: [facility]  OR;  Service: Vascular;  Laterality: Left;   FLEXIBLE SIGMOIDOSCOPY N/A 01/04/2019    Procedure: FLEXIBLE SIGMOIDOSCOPY;  Surgeon: Clarene Essex, MD;  Location: [place];  Service: Endoscopy;  Laterality: N/A;   HYDRADENITIS EXCISION   02/08/2011    Procedure: EXCISION HYDRADENITIS GROIN;  Surgeon: [name], MD;  Location: [place];  Service: General;  Laterality: N/A;  Excisioin of Hidradenitis Left groin   INCISION AND DRAINAGE ABSCESS N/A 01/11/2019    Procedure: INCISION AND DRAINAGE SACRAL ABSCESS;  Surgeon: Georganna Skeans, MD;  Location: [place];  Service: General;  Laterality: N/A;   INGUINAL  HIDRADENITIS EXCISION   07/06/2010    bilateral   INSERTION OF DIALYSIS CATHETER N/A 09/14/2017    Procedure: INSERTION OF TUNNELED DIALYSIS CATHETER Right Internal Jugular;  Surgeon: [name], MD;  Location: [place];  Service: Vascular;  Laterality: N/A;   IR FLUORO GUIDE CV LINE RIGHT   03/01/2019   IR US GUIDE BX ASP/DRAIN   01/01/2019   IR US GUIDE VASC ACCESS RIGHT   03/01/2019   LEFT HEART CATH AND CORONARY ANGIOGRAPHY N/A 08/05/2016    Procedure: Left Heart Cath and Coronary Angiography;  Surgeon: [name], MD;  Location: [place] [facility];  Service: Cardiovascular;  Laterality: N/A;   PERIPHERAL VASCULAR INTERVENTION Left 12/14/2018    Procedure: PERIPHERAL VASCULAR INTERVENTION;  Surgeon: [name], MD;  Location: [place] [facility];  Service: Cardiovascular;  Laterality: Left;  SFA   PERIPHERAL VASCULAR INTERVENTION Left 04/25/2019    Procedure: PERIPHERAL VASCULAR INTERVENTION;  Surgeon: [name], MD;  Location: [place] [facility];  Service: Cardiovascular;  Laterality: Left;  right superficial femoral, and left iliac   PERIPHERAL VASCULAR INTERVENTION Left 07/18/2019    Procedure: PERIPHERAL VASCULAR INTERVENTION;  Surgeon: [name], MD;  Location: [place] [facility];  Service: Cardiovascular;  Laterality: Left;  external iliac   REDUCTION MAMMAPLASTY   2002   TONSILLECTOMY       TUBAL LIGATION   1996   VAGINAL HYSTERECTOMY   08/04/2005    and cysto      Assessment & Plan Clinical Impression: Patient is a 51 y.o. year old female with history of end-stage renal disease with hemodialysis, CAD with stenting maintained on aspirin and Plavix, hypertension, diastolic and systolic congestive heart failure, diabetes mellitus peripheral neuropathy, lower extremity DVT, asthma/quit smoking 5 years ago, peripheral vascular disease with history of left external iliac artery and left SFA stenting.  She underwent recent excision of tumoral calcinosis  left hip/thigh, drain and sutures removed 10/20/2021 without evidence of infection per office notes at [facility].  Per chart review use of straight point cane prior to admission.  1 level home.  Plans to stay with her sister on discharge.  Presented 11/08/2021 with progressive weakness and fall since 11/06/2021.  On arrival she is found to be febrile as well as tachycardic with mild altered mental status.  She was admitted to the ICU for possible sepsis and placed on broad-spectrum antibiotics 11/08/2021 - 11/16/2021.  Cranial CT scan negative.  Chest x-ray showed vague hazy opacity right mid and lower lung worrisome for early developing infiltrate/pneumonia.  She did have some noted drainage from the left hip.  X-rays unilateral with and without contrast of pelvis showed no fracture or dislocation identified.  There was a large area of soft tissue calcification previously described as tumoral calcinosis measuring 21 x 15 cm versus 21 x 14  on the comparison study.  CT of left hip showed extensive calcification with areas layering hyperdense sediment involving the proximal extensor, gluteal and flexor compartment muscles of the left thigh with overall appearance most consistent with tumoral calcinosis.  Soft tissue edema in the subcutaneous fat along the proximal lateral hip and thigh reflecting mild cellulitis.  Admission chemistries potassium 6.9 BUN 117 creatinine 10.68, WBC 12,800, hemoglobin 8.8, SARS coronavirus negative, lactic acid 2.0-2.5, procalcitonin 9.57.  Echocardiogram with ejection fraction of 45 to 50% right ventricular systolic function moderately reduced.  Nephrology consulted with hemodialysis ongoing.  Vascular surgery consulted 11/14/2021 due to left foot tenderness and known peripheral arterial disease and concern for occlusion of her stents..  Aortogram completed undergoing stent of right common iliac artery as well as left common iliac artery with drug-coated balloon angioplasty left  common femoral artery and stent of left SFA 11/16/2021 per Dr. [name].  Patient continues to have some leukocytosis with all antibiotic treatment completed 11/08/2021 - 11/16/2021 and lattest WBC 20,200.  Initially concern for possible bacteremia in patient with vascular access on hemodialysis.  Cultures done after antibiotics showing no growth to date and patient remains afebrile.  He remains on Plavix and aspirin as prior to admission.  Subcutaneous heparin was added for DVT prophylaxis.  Patient with abrupt onset bilateral sensorineural hearing loss confirmed by audiology 11/7 though symptoms have improved and recommendations follow-up outpatient.  Therapy evaluations completed due to patient decreased functional mobility was admitted for a comprehensive rehab program.  Pt presents with decreased activity tolerance, decreased functional mobility, decreased balance, decreased coordination, feelings of stress Limiting pt's independence with leisure/community pursuits.  Met with pt today to discuss TR services including leisure education, activity analysis/modifications and stress management.  Also discussed the importance of social, emotional, spiritual health in addition to physical health and their effects on overall health and wellness.  Pt stated understanding.   Plan  Min 1 TR session during LOS >20 minutes  Recommendations for other services: None   Discharge Criteria: Patient will be discharged from TR if patient refuses treatment 3 consecutive times without medical reason.  If treatment goals not met, if there is a change in medical status, if patient makes no progress towards goals or if patient is discharged from hospital.  The above assessment, treatment plan, treatment alternatives and goals were discussed and mutually agreed upon: by patient  [place] 11/26/2021, 4:24 PM

## 2021-11-26 NOTE — Group Note (Signed)
Patient Details Name: Susan Horton MRN: [id number] DOB: 16-Dec-1970 Today's Date: 11/26/2021       Group Description: Stress management: Pt participated in group session with a focus on stress mgmt, education provided on healthy coping strategies, and social interaction. Focus of session on providing coping strategies to manage new diagnosis to allow for improved mental health to increase overall quality of life . Discussed how to break down stressors into "daily hassles," "major life stressors" and "life circumstances" in an effort to allow pts to chunk their stressors into groups and determine where to best put their efforts/time when dealing with stress. Provided active listening, emotional support and therapeutic use of self. Offered education on factors that protect [name] against stress such as "daily uplifts," "healthy coping strategies" and "protective factors." Encouraged all group members to make an effort to actively recall one event from their day that was a daily uplift in an effort to protect their mindset from stressors as well as sharing this information with their caregivers to facilitate improved caregiver communication and decrease overall burden of care.  Issued pt handouts on healthy coping strategies to implement into routine.   Individual level documentation: Patient participated with full collaboration during session.   Pain:no c/o     Mckinnon Glick 11/26/2021, 4:54 PM

## 2021-11-26 NOTE — Progress Notes (Signed)
Physical Therapy Session Note  Patient Details  Name: Susan Horton MRN: [id number] Date of Birth: October 09, 1970  Today's Date: 11/26/2021 PT Individual Time: 1310-1416 PT Individual Time Calculation (min): 66 min   Today's Date: 11/26/2021 PT Missed Time: 9 Minutes Missed Time Reason: Other (Comment) (praying with pastor)  Short Term Goals: Week 1:  PT Short Term Goal 1 (Week 1): pt will transfer sit<>stand with LRAD and mod A consistantly PT Short Term Goal 2 (Week 1): pt will transfer bed<>chair with LRAD and min A PT Short Term Goal 3 (Week 1): pt will ambulate 89f with LRAD and mod A  Skilled Therapeutic Interventions/Progress Updates:   [facility] and requesting a few minutes. Upon returning, pt agreeable to PT treatment, and reported pain 9/10 in L foot (premedicated). Session with emphasis on functional mobility/transfers, generalized strengthening and endurance, dynamic standing balance/coordination, and gait training. Pt performed WC mobility 1519fusing BUE and supervision with 2 rest breaks with emphasis on UE strength and endurance - transported remainder of way to dayroom in WC dependently. Pt stood with RW and mod A and ambulated 258f 1 and [id number] with RW and min A fading to CGA with +2 for WC follow. Pt required heavy reliance on BUE support on RW but able to place ~50-55% weight through LLE. Transitioned to sit<>stands from elevated EOM x5 with min A fading to close supervision/CGA with BUE support. Pt able to stand without holding onto RW for support but did require heavy use of UEs to push up from mat. Increased challenged and had pt work on sit<>stands without UE support x 4 reps to fatigue from even higher elevated EOM with mod A overall - cues for anterior weight shifting, foot placement, upright posture, and glute activation. Pt requested to get washed up and change dressing. Performed WC mobility [id number] BUE and supervision to fatigue,  then [facility]. Pt sat in [facility] and doffed dirty shirt, washed upper body, and donned clean shirt with set up assist. [facility] A and doffed soiled brief/pants with CGA/min A. Handed off to NT to continue with lower body bathing due to time restrictions.    Therapy Documentation Precautions:  Precautions Precautions: Fall Precaution Comments: L foot drop, bilateral intermittent hearing loss Restrictions Weight Bearing Restrictions: No  Therapy/Group: Individual Therapy Armandina Iman Alfonse AlpersDPT  11/26/2021, 7:11 AM

## 2021-11-26 NOTE — Progress Notes (Signed)
  [facility] Progress Note   Subjective: Seen in room, no c/o's, in good spirits.    Objective Vitals:   11/25/21 1654 11/25/21 1819 11/25/21 1947 11/26/21 0503  BP:  (!) 108/43 (!) 115/41 (!) 123/45  Pulse:  99 78 80  Resp:  '16 15 16  '$ Temp:  98 F (36.7 C) 98.5 F (36.9 C) 98.6 F (37 C)  TempSrc:  Oral  Oral  SpO2:  99% 94% 97%  Weight: 61.1 kg   61.2 kg  Height:         Physical Exam General: Well appearing, nad Heart: RRR  Lungs: Clear bilaterally  Abdomen: soft non -tender Extremities: No LE edema  Dialysis Access: L AVF +bruit    Dialysis Orders:  MWF [facility] 3h 38mn 400/600  61kg  2/2 bath P2  Hep 4000  LUE AVF - mircera 200 mcg IV q2, last 10/16, due 10/30 - venofer '100mg'$  IV tiw thru 11/06 - doxercalciferol 7 ug tiw - sod thiosulfate 25 gm iv tiw  Assessment/Plan: Septic shock - sp course of Zosyn, now resolved > CIR d/t debility  ESRD: Required CRRT on admit, now back to iHD MWF. HD Friday.  HTN/volume: BP controlled, euvolemic on exam and at dry wt Anemia of ESRD: Hgb 7s. Continue max dose Aranesp 2037m weekly while here. No IV Fe with ferritin >5000 Secondary HPTH: CorrCa ok, Phos not to goal. She has trouble tolerating any binder secondary to gastroparesis-we will hold binder for now and follow trend - was on Fosrenol, no iron binders with ferritin 5000 Nutrition: Alb low,  continue supplements sometimes refusing Nepro Encephalopathy - D/t shock/meds?, better. Had some hearing loss and foot drop 11/7 now spontaneously improving. No offending meds Myoclonus: severe, after initial consult. Felt due to meds (cefepime, gabapentin and narcotics). Resolved.  Tumoral calcinosis L hip - long-standing painful issue for her, on NaThio empirically as op,although hasn't helped. [facility]. Recultured 11/2 - NGTD. T2DM with severe gastroparesis - Ongoing issue, s/p esophageal myotomy 09/17/21. Has issues swallow/keeping  pills down. L foot pain - Xray negative, uric acid normal. VVS consulted, s/p LE angiography with improvement in pain  RoKelly SplinterMD 11/26/2021, 12:16 PM  Recent Labs  Lab 11/23/21 0550 11/24/21 0034 11/25/21 0645  HGB 7.9* 7.3* 7.8*  ALBUMIN 1.9* 1.8*  --   CALCIUM 9.2 9.1  --   PHOS 6.8* 4.2  --   CREATININE 5.72* 3.08*  --   K 3.7 4.2  --     Inpatient medications:  aspirin  81 mg Oral Daily   atorvastatin  40 mg Oral Daily   Chlorhexidine Gluconate Cloth  6 each Topical Q0600   clopidogrel  75 mg Oral Q breakfast   darbepoetin (ARANESP) injection - NON-DIALYSIS  200 mcg Subcutaneous Q Wed-1800   doxercalciferol  7 mcg Intravenous Q M,W,F-HD   heparin  5,000 Units Subcutaneous Q8H   lidocaine  1 patch Transdermal Q24H   metoCLOPramide  10 mg Oral TID AC   metoprolol succinate  25 mg Oral Daily   montelukast  10 mg Oral QHS   pantoprazole  40 mg Oral Daily   thiamine  100 mg Oral Daily    acetaminophen, camphor-menthol, fluticasone furoate-vilanterol, ipratropium-albuterol, loperamide, ondansetron, oxyCODONE

## 2021-11-26 NOTE — IPOC Note (Signed)
Overall Plan of Care [facility]) Patient Details Name: Susan Horton MRN: [id number] DOB: [birth year]  Admitting Diagnosis: Sepsis [facility])  Hospital Problems: Principal Problem:   Sepsis ([place])     Functional Problem List: Nursing Perception, Bladder, Bowel, Safety, Sensory, Edema, Skin Integrity, Endurance, Medication Management, Motor, Pain  PT Balance, Edema, Endurance, Motor, Nutrition, Pain, Sensory, Skin Integrity  OT Balance, Endurance, Pain, Sensory, Skin Integrity  SLP    TR         Basic ADL's: OT Bathing, Dressing, Toileting     Advanced  ADL's: OT       Transfers: PT Bed Mobility, Bed to Chair, Car, Manufacturing systems engineer, Metallurgist: PT Ambulation, Emergency planning/management officer     Additional Impairments: OT    SLP        TR      Anticipated Outcomes Item Anticipated Outcome  Self Feeding n/a  Swallowing      Basic self-care  supervision  Toileting  supervision   Bathroom Transfers supervision  Bowel/Bladder  continent x 2  Transfers  supervision with LRAD  Locomotion  supervision with LRAD  Communication     Cognition     Pain  less than 4  Safety/Judgment  remain fall free while in rehab   Therapy Plan: PT Intensity: Minimum of 1-2 x/day ,45 to 90 minutes PT Frequency: 5 out of 7 days PT Duration Estimated Length of Stay: 14-18 days OT Intensity: Minimum of 1-2 x/day, 45 to 90 minutes OT Frequency: 5 out of 7 days OT Duration/Estimated Length of Stay: 14-18 days     Team Interventions: Nursing Interventions Patient/Family Education, Pain Management, Bladder Management, Medication Management, Discharge Planning, Bowel Management, Skin Care/Wound Management, Psychosocial Support, Disease Management/Prevention  PT interventions Ambulation/gait training, Discharge planning, Functional mobility training, Psychosocial support, Therapeutic Activities, Visual/perceptual remediation/compensation, Balance/vestibular training, Disease  management/prevention, Neuromuscular re-education, Skin care/wound management, Therapeutic Exercise, Wheelchair propulsion/positioning, DME/adaptive equipment instruction, Cognitive remediation/compensation, Pain management, Splinting/orthotics, UE/LE Strength taining/ROM, Community reintegration, Technical sales engineer stimulation, Patient/family education, IT trainer, UE/LE Coordination activities  OT Interventions Disease mangement/prevention, Functional mobility training, Pain management, Patient/family education, Self Care/advanced ADL retraining, Skin care/wound managment, Therapeutic Activities, Therapeutic Exercise, UE/LE Strength taining/ROM, UE/LE Coordination activities, Balance/vestibular training  SLP Interventions    TR Interventions    SW/CM Interventions Discharge Planning, Psychosocial Support, Patient/Family Education   Barriers to Discharge MD  Medical stability  Nursing Decreased caregiver support, Wound Care, Hemodialysis, Weight bearing restrictions, Medication compliance home with sister Leandro Reasoner. 1 level house with level entry  PT Inaccessible home environment, Home environment access/layout, Wound Care, Hemodialysis, Other (comments) pain, 2 STE with 0 rails, leaking L hip wound, complex medical hx  OT Wound Care, Hemodialysis    SLP      SW Hemodialysis     Team Discharge Planning: Destination: PT-Home ,OT- Home , SLP-  Projected Follow-up: PT-Home health PT, OT-  Home health OT, SLP-  Projected Equipment Needs: PT-To be determined, OT- To be determined, SLP-  Equipment Details: PT-has SPC, RW, and hoveraround scooter, OT-  Patient/family involved in discharge planning: PT- Patient,  OT-Patient, SLP-   MD ELOS: 10-12 days Medical Rehab Prognosis:  Excellent Assessment: The patient has been admitted for CIR therapies with the diagnosis of encephalopathy and debility. The team will be addressing functional mobility, strength, stamina, balance, safety, adaptive  techniques and equipment, self-care, bowel and bladder mgt, patient and caregiver education. Goals have been set at modI/S. Anticipated discharge destination is home.  See Team Conference Notes for weekly updates to the plan of care

## 2021-11-26 NOTE — Progress Notes (Signed)
PROGRESS NOTE   Subjective/Complaints: +diarrhea, received imodium yesterday, d/ced prn miralax.  +longstanding gastroparesis, it is a little better today  ROS: +pruritus, +foot pain well controlled. +left hip pain 9/10, +diarrhea   Objective:   MR HIP LEFT WO CONTRAST  Result Date: 11/25/2021 CLINICAL DATA:  Left hip pain. History of renal failure and tumoral calcinosis. EXAM: MR OF THE LEFT HIP WITHOUT CONTRAST TECHNIQUE: Multiplanar, multisequence MR imaging was performed. No intravenous contrast was administered. COMPARISON:  CT scan 11/09/2021 FINDINGS: Changes of extensive tumoral calcinosis surrounding the left hip and left femur. Surrounding diffuse body wall edema and myositis involving the hip and pelvic musculature. No discrete subcutaneous fluid collection to suggest a drainable abscess. Both hips are normally located. No findings suspicious for septic arthritis or osteomyelitis. No stress fracture or AVN. The pubic symphysis and SI joints are intact. No findings to suggest septic arthritis. No significant intrapelvic abnormalities. IMPRESSION: 1. Changes of extensive tumoral calcinosis surrounding the left hip and left femur. 2. Diffuse body wall edema and myositis involving the hip and pelvic musculature. 3. No findings suspicious for septic arthritis or osteomyelitis. 4. No significant intrapelvic abnormalities. Electronically Signed   By: Marijo Sanes M.D.   On: 11/25/2021 08:19   Recent Labs    11/24/21 0034 11/25/21 0645  WBC 22.2* 17.3*  HGB 7.3* 7.8*  HCT 25.9* 26.7*  PLT 508* 533*   Recent Labs    11/24/21 0034  NA 137  K 4.2  CL 94*  CO2 25  GLUCOSE 120*  BUN 19  CREATININE 3.08*  CALCIUM 9.1    Intake/Output Summary (Last 24 hours) at 11/26/2021 1107 Last data filed at 11/26/2021 0700 Gross per 24 hour  Intake 120 ml  Output 1500 ml  Net -1380 ml     Pressure Injury 11/15/21 Sacrum Medial  Stage 2 -  Partial thickness loss of dermis presenting as a shallow open injury with a red, pink wound bed without slough. (Active)  11/15/21 1030  Location: Sacrum  Location Orientation: Medial  Staging: Stage 2 -  Partial thickness loss of dermis presenting as a shallow open injury with a red, pink wound bed without slough.  Wound Description (Comments):   Present on Admission: Yes    Physical Exam: Vital Signs Blood pressure (!) 123/45, pulse 80, temperature 98.6 F (37 C), temperature source Oral, resp. rate 16, height '5\' 5"'$  (1.651 m), weight 61.2 kg, SpO2 97 %. Gen: no distress, normal appearing, BMI 22.71 HEENT: oral mucosa pink and moist, NCAT Cardio: Reg rate Chest: normal effort, normal rate of breathing Abd: soft, non-distended Ext: no edema Psych: pleasant, normal affect Musculoskeletal:     Cervical back: Normal range of motion.     Comments: Left hip tender with ROM, appears swollen. Large dressing in place. Left foot hypersensitive to touch.  Skin:    Comments: Left hip Xeroform dressing in place.  2 incisions left posterior lateral thigh without odor but with yellowish/tan drainage with clumpy, white debris. No odor. Wounds themselves appear pink without necrotic tissue. Left foot: tip of great toe and 2nd/third toes appear ischemic. Skin on dorsum of left foot partially peeled due to lidocaine  patch. AVF in LUE  Neurological:     Mental Status: She is alert.     Comments:    Alert and oriented x 3. Normal insight and awareness. Intact Memory. Normal language and speech. Cranial nerve exam unremarkable. UE grossly 5/5. RLE 3+ to 4/5 prox to 4+/5 distally. LLE 2/5 prox to trace/5 ankle. Stocking glove sensory loss bilateral LE, L>R up to mid calf.   Psychiatric:        Mood and Affect: Mood normal.        Behavior: Behavior normal.    Assessment/Plan: 1. Functional deficits which require 3+ hours per day of interdisciplinary therapy in a comprehensive inpatient rehab  setting. Physiatrist is providing close team supervision and 24 hour management of active medical problems listed below. Physiatrist and rehab team continue to assess barriers to discharge/monitor patient progress toward functional and medical goals  Care Tool:  Bathing    Body parts bathed by patient: Right arm, Left arm, Chest, Abdomen, Front perineal area, Right upper leg, Left upper leg, Right lower leg, Left lower leg, Face   Body parts bathed by helper: Buttocks     Bathing assist Assist Level: Minimal Assistance - Patient > 75% Assistive Device Comment: Sara Steady and shower chair   Upper Body Dressing/Undressing Upper body dressing   What is the patient wearing?: Pull over shirt    Upper body assist Assist Level: Set up assist (Seated on toilet/shower chair) Assistive Device Comment: Seated on WC  Lower Body Dressing/Undressing Lower body dressing      What is the patient wearing?: Pants     Lower body assist Assist for lower body dressing: Moderate Assistance - Patient 50 - 74%     Toileting Toileting    Toileting assist Assist for toileting: Maximal Assistance - Patient 25 - 49% (Steady)     Transfers Chair/bed transfer  Transfers assist     Chair/bed transfer assist level: Minimal Assistance - Patient > 75% (Using stand pivot)     Locomotion Ambulation   Ambulation assist   Ambulation activity did not occur: Safety/medical concerns (pain, fatigue, weakness/deconditioning, decreased balance)          Walk 10 feet activity   Assist  Walk 10 feet activity did not occur: Safety/medical concerns (pain, fatigue, weakness/deconditioning, decreased balance)        Walk 50 feet activity   Assist Walk 50 feet with 2 turns activity did not occur: Safety/medical concerns (pain, fatigue, weakness/deconditioning, decreased balance)         Walk 150 feet activity   Assist Walk 150 feet activity did not occur: Safety/medical concerns (pain,  fatigue, weakness/deconditioning, decreased balance)         Walk 10 feet on uneven surface  activity   Assist Walk 10 feet on uneven surfaces activity did not occur: Safety/medical concerns (pain, fatigue, weakness/deconditioning, decreased balance)         Wheelchair     Assist Is the patient using a wheelchair?: Yes Type of Wheelchair: Manual    Wheelchair assist level: Supervision/Verbal cueing Max wheelchair distance: 40f    Wheelchair 50 feet with 2 turns activity    Assist        Assist Level: Supervision/Verbal cueing   Wheelchair 150 feet activity     Assist      Assist Level: Moderate Assistance - Patient 50 - 74%   Blood pressure (!) 123/45, pulse 80, temperature 98.6 F (37 C), temperature source Oral, resp. rate 16, height  $'5\' 5"'t$  (1.651 m), weight 61.2 kg, SpO2 97 %.    Medical Problem List and Plan: 1. Functional deficits secondary to septic shock/metabolic with debility and encephalopathy. Pt appears cognitively to be at baseline. -  Antibiotic therapy 10/29 - 11/6             -patient may shower if left hip is covered             -ELOS/Goals: 10-12 days, mod I to supervision goals  Continue CIR 2.  Impaired mobility: working on sit to stands -DVT/anticoagulation:  Pharmaceutical: continue Heparin given bleeding risk             -antiplatelet therapy: Aspirin 81 mg daily and Plavix 75 mg daily 3. Right foot pain: changed Lidoderm patch to bottom of right foot, oxycodone as needed. Provided list of foods for pain relief.  4. Mood/Behavior/Sleep: Provide emotional support             -antipsychotic agents: N/A 5. Neuropsych/cognition: This patient is capable of making decisions on her own behalf. 6. Skin/Wound Care: Continue BID to TID dressing changes to left hip. -WOC follow-up for left hip drainage. -completed course of broad spectrum abx on 11/6   -Assistance provided by orthopedics. 7. Fluids/Electrolytes/Nutrition: Routine in  and outs with follow-up chemistries 8.  Peripheral vascular disease.  Follow-up vascular surgery for occluded stents status post right common iliac left common iliac stenting with balloon angioplasty left common femoral and stent left SFA 11/16/2021 per Dr. [name] 9.  Tumoral calcinosis.  Recent excision of tumoral calcinosis left hip thigh at [facility] 10/20/2021.  Weightbearing as tolerated. Communicated MRI results to ortho 10.  End-stage renal disease.  Continue hemodialysis as directed. Discussed improved Creatinine to 3.08 on 11/14.  11.  Acute on chronic anemia.  Continue iron supplement 12.  Leukocytosis.  (20k)  -Broad spectrum antibiotic therapy completed 11/16/2021 and monitor.  Latest blood culture showed no growth             -appearance of drainage is concerning. Recent CT (10/30) demonstrating changes c/w tumoral calcinosis and mild cellulitis.              -MRI hip obtianed and stable              -ischemic toes LLE could also be a source             -continue wound care as above             -pt feels well otherwise and no other signs of infection             -consider H/O consult if persistent 13.  Asthma without exacerbation as well as remote history of tobacco abuse.  Continue inhalers as directed check oxygen saturations every shift 14. Diet controlled Diabetes mellitus. Blood sugar checks discontinued 15.  Hypertension.  Toprol-XL 25 mg daily.  Monitor with increased mobility 16.  Abrupt onset bilateral sensorineural hearing loss.  Confirmed by audiology 11/7 the symptoms have resolved.  Follow-up outpatient. 17.  Diastolic and systolic congestive heart failure.  Monitor for any signs of fluid overload. Daily weights ordered 18. Pruritus: sarna lotion ordered.  19. Diarrhea: continue imodium 20. Insomnia: secondary to diarrhea, continue imodium.   LOS: 3 days A FACE TO FACE EVALUATION WAS PERFORMED  Adrien Dietzman P Kortney Schoenfelder 11/26/2021, 11:07 AM

## 2021-11-26 NOTE — Patient Care Conference (Signed)
Inpatient RehabilitationTeam Conference and Plan of Care Update Date: 11/25/2021   Time: 11:39 AM    Patient Name: [place] Record Number: [id number]  Date of Birth: 09/06/1970 Sex: Female         Room/Bed: 4M05C/4M05C-01 Payor Info: Payor: MEDICARE / Plan: MEDICARE PART A AND B / Product Type: *No Product type* /    Admit Date/Time:  11/23/2021 10:54 PM  Primary Diagnosis:  Sepsis [facility])  Hospital Problems: Principal Problem:   Sepsis [facility])    Expected Discharge Date: Expected Discharge Date: 12/12/21  Team Members Present: Physician leading conference: Dr. Leeroy Cha Social Worker Present: Erlene Quan, BSW Nurse Present: Tacy Learn, RN PT Present: Becky Sax, PT OT Present: Jennefer Bravo, OT PPS Coordinator present : Gunnar Fusi, SLP     Current Status/Progress Goal Weekly Team Focus  Bowel/Bladder    Continent B/B   Remain continent of B/B  Toilet every 2 hours and PRN    Swallow/Nutrition/ Hydration               ADL's   min A bathing, mod A LB self-care, max A toileting   Supervision   ADL retraining, general/cardiorespiratory endurance, strengthening, dynamic balance    Mobility   bed mobility CGA, sit<>stands with RW max A, stand<>pivot mod A, WC mobility 13f supervision - unable to ambulate   supervision/min A steps  functional mobility/transfers, pain management, dynamic standing balance/coordination, generalized strengthening and endurance.    Communication                Safety/Cognition/ Behavioral Observations               Pain    8 out 10 pain to left thigh/leg    Less than 4 out of 10 on pain scale with PRN medications   Assess every 4 hours and prior to therapy     Skin    Incision with moderate of drainage from left thigh. Abrasions to right buttocks and Stage II to Sacrum  Areas will improve without infection or additional breakdown.  Assess every shift and PRN      Discharge  Planning:  Discharging home with sister and 3 daughters to assist at discharge 24/7.   Team Discussion: Sepsis. Continent of B/B. Uncontrolled pain that does decrease with PRN medications. Incision to left thigh had moderate amount of drainage. Stage II to sacrum. Renal diet with 1200 FR. Ortho consulted for left hip. Lidoderm patch that was applied to left foot when removed, removed some skin. Therapy with supervision goals. Will review at next conference if goals are obtainable.  Patient on target to meet rehab goals: First day of therapy.   *See Care Plan and progress notes for long and short-term goals.   Revisions to Treatment Plan:  Medication adjustments, ortho consult, MRI of left hip  Teaching Needs: Medications, safety, gait/transfer training, skin/wound care, etc  Current Barriers to Discharge: Decreased caregiver support, Home enviroment access/layout, Wound care, Hemodialysis, and Weight bearing restrictions  Possible Resolutions to Barriers: Family education, nursing education, order recommended DME     Medical Summary Current Status: Left hip and foot wounds, postsurgical pain, left foot longstanding neuropathic pain, gastroparesis, end stage renal disease  Barriers to Discharge: Medical stability;Complicated Wound;Renal Insufficiency/Failure;Uncontrolled Pain  Barriers to Discharge Comments: Left hip and foot wounds, postsurgical pain, left foot longstanding neuropathic pain, gastroparesis, end stage renal disease Possible Resolutions to Barriers/Weekly Focus: Left Hip MRI ordered and discussed  with ortho, oxycodone one time dose added siince vomitted medicine this morning, continue reglan   Continued Need for Acute Rehabilitation Level of Care: The patient requires daily medical management by a physician with specialized training in physical medicine and rehabilitation for the following reasons: Direction of a multidisciplinary physical rehabilitation program to maximize  functional independence : Yes Medical management of patient stability for increased activity during participation in an intensive rehabilitation regime.: Yes Analysis of laboratory values and/or radiology reports with any subsequent need for medication adjustment and/or medical intervention. : Yes   I attest that I was present, lead the team conference, and concur with the assessment and plan of the team.   Ernest Pine 11/26/2021, 9:07 AM

## 2021-11-27 DIAGNOSIS — R5381 Other malaise: Secondary | ICD-10-CM | POA: Diagnosis not present

## 2021-11-27 MED ORDER — MEDIHONEY WOUND/BURN DRESSING EX PSTE
1.0000 | PASTE | Freq: Every day | CUTANEOUS | Status: DC
  Administered 2021-11-27 – 2021-12-14 (×15): 1 via TOPICAL
  Filled 2021-11-27: qty 44

## 2021-11-27 NOTE — Progress Notes (Signed)
  [facility] Progress Note   Subjective: Seen in room, no c/o's, in good spirits.    Objective Vitals:   11/26/21 1946 11/27/21 0500 11/27/21 0727 11/27/21 1308  BP: (!) 119/45  (!) 114/46 (!) 115/48  Pulse: (!) 43  76 84  Resp: '16  18 18  '$ Temp: 97.6 F (36.4 C)  97.9 F (36.6 C) 97.7 F (36.5 C)  TempSrc: Oral  Oral   SpO2: 94%  100% 100%  Weight:  62.7 kg    Height:         Physical Exam General: Well appearing, nad Heart: RRR  Lungs: Clear bilaterally  Abdomen: soft non -tender Extremities: No LE edema  Dialysis Access: L AVF +bruit    Dialysis Orders:  MWF [facility] 3h 60mn 400/600  61kg  2/2 bath P2  Hep 4000  LUE AVF - mircera 200 mcg IV q2, last 10/16, due 10/30 - venofer '100mg'$  IV tiw thru 11/06 - doxercalciferol 7 ug tiw - sod thiosulfate 25 gm iv tiw  Assessment/Plan: Septic shock - sp course of Zosyn, now resolved > CIR d/t debility  ESRD: Required CRRT on admit, now back to iHD MWF. HD today.  HTN/volume: BP controlled, euvolemic on exam.  Anemia of ESRD: Hgb 7s. Continue max dose Aranesp 2053m weekly while here. No IV Fe with ferritin >5000 Secondary HPTH: CorrCa ok, Phos not to goal. She has trouble tolerating any binder secondary to gastroparesis-we will hold binder for now and follow trend - was on Fosrenol, no iron binders with ferritin 5000 Nutrition: Alb low,  continue supplements sometimes refusing Nepro Encephalopathy - D/t shock/meds?, better. Had some hearing loss and foot drop 11/7 now spontaneously improving. No offending meds Myoclonus: severe, after initial consult. Felt due to meds (cefepime, gabapentin and narcotics). Resolved.  Tumoral calcinosis L hip - long-standing painful issue for her, on NaThio empirically as op,although hasn't helped. [facility] Recultured 11/2 - NGTD. T2DM with severe gastroparesis - Ongoing issue, s/p esophageal myotomy 09/17/21. Has issues swallow/keeping pills down. L  foot pain - Xray negative, uric acid normal. VVS consulted, s/p LE angiography with improvement in pain  RoKelly SplinterMD 11/27/2021, 2:07 PM  Recent Labs  Lab 11/23/21 0550 11/24/21 0034 11/25/21 0645  HGB 7.9* 7.3* 7.8*  ALBUMIN 1.9* 1.8*  --   CALCIUM 9.2 9.1  --   PHOS 6.8* 4.2  --   CREATININE 5.72* 3.08*  --   K 3.7 4.2  --      Inpatient medications:  aspirin  81 mg Oral Daily   atorvastatin  40 mg Oral Daily   Chlorhexidine Gluconate Cloth  6 each Topical Q0600   clopidogrel  75 mg Oral Q breakfast   darbepoetin (ARANESP) injection - NON-DIALYSIS  200 mcg Subcutaneous Q Wed-1800   doxercalciferol  7 mcg Intravenous Q M,W,F-HD   heparin  5,000 Units Subcutaneous Q8H   leptospermum manuka honey  1 Application Topical Daily   lidocaine  1 patch Transdermal Q24H   metoCLOPramide  10 mg Oral TID AC   metoprolol succinate  25 mg Oral Daily   montelukast  10 mg Oral QHS   pantoprazole  40 mg Oral Daily   thiamine  100 mg Oral Daily    acetaminophen, camphor-menthol, fluticasone furoate-vilanterol, ipratropium-albuterol, loperamide, ondansetron, oxyCODONE

## 2021-11-27 NOTE — Progress Notes (Signed)
Physical Therapy Session Note  Patient Details  Name: Susan Horton MRN: [id number] Date of Birth: 29-Dec-1970  Today's Date: 11/27/2021 PT Individual Time: 9233-0076 PT Individual Time Calculation (min): 71 min   Short Term Goals: Week 1:  PT Short Term Goal 1 (Week 1): pt will transfer sit<>stand with LRAD and mod A consistantly PT Short Term Goal 2 (Week 1): pt will transfer bed<>chair with LRAD and min A PT Short Term Goal 3 (Week 1): pt will ambulate 55f with LRAD and mod A  Skilled Therapeutic Interventions/Progress Updates:   Received pt sitting EOB getting dressing changed and measuring wounds on L foot. Pt agreeable to PT treatment and reported pain 10/10 in L foot (premedicated) - repositioning, rest breaks, and distraction done to reduce pain levels. Pt also reported increased swelling in L foot today but unable to tolerate compression sock. Session with emphasis on functional mobility/transfers, generalized strengthening and endurance, dynamic standing balance/coordination, simulated car transfers, and gait training. MD arrived for morning rounds and inspected wound on foot. Pt transferred bed<>WC stand<>pivot with RW and CGA but required light mod A to stand. [facility]. Pt performed simulated car transfer with RW and min A with cues for hand placement and turning technique. Pt then ambulated 185fwith RW and CGA to mat and worked on dynamic standing balance playing cornhole x 3 trials with min A for balance without UE support. Recreational therapist placing beanbags at various heights/angles to encourage pt to reach outside BOS. Pt with 2-3 moderate episodes of LOB requiring assist to correct. Pt stood from mat with RW and CGA and ambulated 1341fith RW and CGA to Nustep. Pt performed BUE/LE strengthening on Nustep at workload 2 for 3 minutes and 14 seconds for a total of 3f63fth emphasis on cardiovascular endurance and  weight bearing through LUE. Pt transferred Nustep<>WC stand<>pivot with RW and CGA and returned to room. Concluded session with pt sitting in WC, needs within reach, and seatbelt alarm on.   Therapy Documentation Precautions:  Precautions Precautions: Fall Precaution Comments: L foot drop, bilateral intermittent hearing loss Restrictions Weight Bearing Restrictions: No  Therapy/Group: Individual Therapy AnnaAlfonse Alpers DPT  11/27/2021, 7:00 AM

## 2021-11-27 NOTE — Progress Notes (Signed)
 11/27/21 0915  Clinical Encounter Type  Visited With Patient;Health care provider (Nurse Entered)  Visit Type Initial;Other (Comment) (Advance Directive Education)  Referral From Physician;Nurse (Krutika P. Raulkar, MD; Blair L. Montantanti, LPN; Taylor B. Mitchell, LPN)  Consult/Referral To Chaplain (Frederick Sibben)  Recommendations Advance Directive Education  Advance Directives (For Healthcare)  Does Patient Have a Medical Advance Directive? No  Would patient like information on creating a medical advance directive?  (Education Provided and Packet left with patient for completion. Education was interrupted by Physician and nurse. Patient directed to have nurse page if she has questions.)   Chaplain responded to page for spiritual care consultation requesting assistance for Advance Directive preparation with Ms. Embrie L. Nunnery. Chaplain met with Ms. Campisi, at patient's bedside on 11/27/2021 at 0915.   Ms. Hassell stated that she does NOT have a court appointed Health Care Power of Attorney.  Ms. Andreoli stated that she is NOT Married.   Ms. Husted indicated that she intends to list her Sister: Lavonda Brown, [phone] as her Health Care Agent - Health Care Power of Attorney.   Ms. Wollenberg indicated that she intends to list her Son: Samuel McDaniels,  [phone] as her Alternate Health Care Agent - Health Care Power of Attorney.  Chaplain provided the Advance Directive packet as well as education on Advance Directives-documents an individual completes to communicate their health care directions in advance of a time when they may need them. Chaplain informed Ms. Boyko the documents which may be completed here in the hospital are the Living Will and Health Care Power of Attorney.   Chaplain informed Ms. Ferdinand that the Health Care Power of Attorney is a legal document in which an individual names another person, as their Health Care Agent, to communicate her health care decisions,  when she is not able to communicate them for herself. The Health Care Agent's function can be temporary or permanent depending on her ability to make and communicate those decisions independently.   Chaplain informed Ms. Murad in the absence of a Health Care Power of Attorney, the state of [place] directs health care providers to look to the following individuals in the order listed: legal guardian; an attorney-in-fact under a general power of attorney (POA) if that POA includes the right to make health care decisions; person's spouse; a majority of her adult children; a majority of adult brothers and sisters; or an individual who has an established relationship with you, who is acting in good faith and who can convey your wishes.  If none of these persons are available or willing to make medical decisions on a patient's behalf, the law allows the patient's doctor to make decisions for them as long as another doctor agrees with those decisions.  Chaplain also informed the patient that the Health Care agent has no decision-making authority over any affairs other than those related to her medical care.   The chaplain further educated Ms. Mages that a Living Will is a legal document that allows her desires not to receive life-prolonging measures in the event that she has a condition that is incurable and will result in her death in a short period of time; they are unconscious, and doctors are confident that she will not regain consciousness; and/or she has advanced dementia or other substantial and irreversible loss of mental function.   The chaplain informed Ms. Proctor that life-prolonging measures are medical treatments that would only serve to postpone death, including breathing machines, kidney dialysis,   antibiotics, artificial nutrition and hydration (tube feeding), and similar forms of treatment and that if an individual is able to express their wishes, they may also make them known without the use of  a Living Will, but in the event that she is not able to express her wishes, a Living Will allows medical providers and the her family and friends to ensure that they are not making decisions on her behalf, but rather serving as her voice to convey decisions that she has already made.   Ms. Brooker is aware that the decision to create an advance directive is hers alone and she may choose not to complete the documents or may choose to complete one portion or both.  Ms. Life was informed that she can revoke the documents at any time by striking through them and writing void or by completing new documents, but that it is also advisable that the individual verbally notify interested parties that her wishes have changed.   Ms. Luzier is also aware that the document must be signed in the presence of a notary public and two witnesses and that this may be done while the patient is still admitted to the hospital or after discharge in the community. If they decide to complete Advance Directives after being discharged from the hospital, they have been advised to notify all interested parties and to provide those documents to their physicians and loved ones in addition to bringing them to the hospital in the event of another hospitalization.   The chaplain informed the Ms. Hollman that if she desires to proceed with completing the Advance Directive Documentation while she is still admitted, notary services are typically available at [facility] between the hours of 1:00 and 3:30 Monday-Thursday.   When the patient is ready to have these documents completed, the patient should request that their nurse place a spiritual care consult and indicate that the patient is ready to have their advance directives notarized so that arrangements for witnesses and notary public can be made.  If there is an immediate need to have notarized please page the Chaplain.   Please page spiritual care if the patient desires further  education or has questions.   Chaplain Frederick Sibben, M. Min., [phone]. 

## 2021-11-27 NOTE — Progress Notes (Signed)
Patient ID: Susan Horton, female   DOB: Jul 26, 1970, 51 y.o.   MRN: [id number]  Accomodation letter for ramp and bathroom provided to patient.

## 2021-11-27 NOTE — Progress Notes (Signed)
Recreational Therapy Session Note  Patient Details  Name: Susan Horton MRN: [id number] Date of Birth: 1970-01-26 Today's Date: 11/27/2021  Pain: c/o 10/10 L foot pain, RN aware, premedicated Skilled Therapeutic Interventions/Progress Updates: Goal:  Pt will maintain dynamic standing balance for mod complex task without UE support with Min assist.  MET  Session focused on activity tolerance, standing tolerance and dynamic standing balance during co-treat with PT.  Pt ambulated ~13' with RW and contact guard assist to mat table.  After seated rest break, pt stood without UE support for corn hole activity.  Pt stood with MIN assist reaching outside BOS in all directions to retrieve beanbags for game play x3.  Therapy/Group: Co-Treatment   Karrigan Messamore 11/27/2021, 11:12 AM

## 2021-11-27 NOTE — Plan of Care (Signed)
CHANGE IN DIALYSIS SCHEDULE DUE TO HOLIDAY  Susan Horton 01-27-1970 [id number]  Patient dialysis schedule for the week of 11/29/21-12/06/21 varies from their normal schedule due to Thanksgiving Holiday.  Need to keep this in mind for discharge planning.  The Holiday schedule is as follow:   Normal HD Schedule: Monday Wednesday Friday Thanksgiving schedule:Sunday, Tuesday, Friday  They will resume their normal schedule on 12/07/21.     Jen Mow, PA-C [facility] Pager: [phone]

## 2021-11-27 NOTE — Progress Notes (Signed)
Occupational Therapy Session Note  Patient Details  Name: Susan Horton MRN: [id number] Date of Birth: [birth year]  Today's Date: 11/27/2021 OT Individual Time: 0800-0900  OT Individual Time Calculation (min): 60 min    Short Term Goals: Week 1:  OT Short Term Goal 1 (Week 1): Pt will perform STS transfers with Mod A + LRAD in preparation ADL tasks. OT Short Term Goal 2 (Week 1): Pt will engage in standing table-top activities >2 minutes with Mod A + LRAD in preparation for ADL tasks. OT Short Term Goal 3 (Week 1): Pt will demonstrate carry-over of LB compensatory dressing techniques with supervision.  Skilled Therapeutic Interventions/Progress Updates:  Session 1 Skilled OT intervention completed with focus on functional endurance and ADL retraining within a shower context. Pt received seated in w/c, agreeable to session. 9/10 pain reported in LLE indicating soreness from yesterday's mobility, however declined medication due to current groggy feeling. Therapist educated pt on concept of delayed onset muscle syndrome with tangible strategies pt could utilize for pain management including modifications. Offered rest breaks and repositioning throughout for pain reduction.  Pt agreeable to shower to improve overall alertness and muscle soreness. Transported in w/c to shower, then CGA sit > stand using grab bar and CGA stand pivot to shower chair. Therapist applied water proof cover to L hip per MD order. Pt was able to bathe at an overall supervision level, however mod A needed to stand with grab bars due to low seat and LLE pain, and CGA for balance in stance during pericare.  Attempted sit > stand however too much pain therefore mod A squat pivot to w/c using grab bar. Able to dress UB with set up A, grooming with mod I. Donned knee high TEDs on RLE due to mild edema in foot with total A for time, pt able to thread underwear with pad, pants with supervision, then mod A sit > stand using RW with min  A for underwear over hips and pants remained halfway down due to soiled bandage. Bandage with need of changing, therefore pt preferred to end session seated EOB, with pt ambulating about 5 ft using RW with CGA to EOB.   Pt remained seated EOB, with bed alarm on, nurse aware of pt status and needs, with all needs in reach at end of session.   Therapy Documentation Precautions:  Precautions Precautions: Fall Precaution Comments: L foot drop, bilateral intermittent hearing loss Restrictions Weight Bearing Restrictions: No    Therapy/Group: Individual Therapy  Blase Mess, MS, OTR/L  11/27/2021, 12:20 PM

## 2021-11-27 NOTE — Progress Notes (Signed)
Received patient in bed to unit.  Alert and oriented.  Informed consent signed and in chart.   Treatment initiated: 1513 Treatment completed: 1822  Patient tolerated had low b/p when b/p rebound and attempt to turn uf on b/p would drop. Transported back to the room  Alert, without acute distress.  Hand-off given to patient's nurse.   Access used: AVF Access issues: None  Total UF removed: 0 Medication(s) given: Zofran  Post HD VS: 102/61,90,16,98% Post HD weight: 62.7kg   [facility]

## 2021-11-27 NOTE — Progress Notes (Signed)
PROGRESS NOTE   Subjective/Complaints: Has pain on dorsum of left foot where skin was peeled off by lidocaine patch. Has new blister there as well Diarrhea has improved with Imodium.  ROS: +pruritus, +foot pain well controlled. +left hip pain 9/10, +diarrhea- improved with imodium   Objective:   No results found. Recent Labs    11/25/21 0645  WBC 17.3*  HGB 7.8*  HCT 26.7*  PLT 533*   No results for input(s): "NA", "K", "CL", "CO2", "GLUCOSE", "BUN", "CREATININE", "CALCIUM" in the last 72 hours.   Intake/Output Summary (Last 24 hours) at 11/27/2021 1448 Last data filed at 11/27/2021 0830 Gross per 24 hour  Intake 240 ml  Output --  Net 240 ml     Pressure Injury 11/15/21 Sacrum Medial Stage 2 -  Partial thickness loss of dermis presenting as a shallow open injury with a red, pink wound bed without slough. (Active)  11/15/21 1030  Location: Sacrum  Location Orientation: Medial  Staging: Stage 2 -  Partial thickness loss of dermis presenting as a shallow open injury with a red, pink wound bed without slough.  Wound Description (Comments):   Present on Admission: Yes    Physical Exam: Vital Signs Blood pressure (!) 115/48, pulse 84, temperature 97.7 F (36.5 C), resp. rate 18, height '5\' 5"'$  (1.651 m), weight 62.7 kg, SpO2 100 %. Gen: no distress, normal appearing, BMI 22.71 HEENT: oral mucosa pink and moist, NCAT Cardio: Reg rate Chest: normal effort, normal rate of breathing Abd: soft, non-distended Ext: no edema Psych: pleasant, normal affect Musculoskeletal:     Cervical back: Normal range of motion.     Comments: Left hip tender with ROM, appears swollen. Large dressing in place. Left foot hypersensitive to touch.  Skin:    Comments:  Left hip Xeroform dressing in place.  2 incisions left posterior lateral thigh without odor but with yellowish/tan drainage with clumpy, white debris. No odor. Wounds  themselves appear pink without necrotic tissue.  Left foot: tip of great toe and 2nd/third toes appear ischemic. Skin on dorsum of left foot partially peeled due to lidocaine patch. AVF in LUE  Stage 2 to sacrum Neurological:     Mental Status: She is alert.     Comments:    Alert and oriented x 3. Normal insight and awareness. Intact Memory. Normal language and speech. Cranial nerve exam unremarkable. UE grossly 5/5. RLE 3+ to 4/5 prox to 4+/5 distally. LLE 2/5 prox to trace/5 ankle. Stocking glove sensory loss bilateral LE, L>R up to mid calf.   Psychiatric:        Mood and Affect: Mood normal.        Behavior: Behavior normal.    Assessment/Plan: 1. Functional deficits which require 3+ hours per day of interdisciplinary therapy in a comprehensive inpatient rehab setting. Physiatrist is providing close team supervision and 24 hour management of active medical problems listed below. Physiatrist and rehab team continue to assess barriers to discharge/monitor patient progress toward functional and medical goals  Care Tool:  Bathing    Body parts bathed by patient: Right arm, Left arm, Chest, Abdomen, Front perineal area, Right upper leg, Left upper leg, Right  lower leg, Left lower leg, Face   Body parts bathed by helper: Buttocks     Bathing assist Assist Level: Contact Guard/Touching assist Assistive Device Comment: stand pivot, shower chair   Upper Body Dressing/Undressing Upper body dressing   What is the patient wearing?: Pull over shirt    Upper body assist Assist Level: Set up assist Assistive Device Comment: Seated on WC  Lower Body Dressing/Undressing Lower body dressing      What is the patient wearing?: Pants, Underwear/pull up     Lower body assist Assist for lower body dressing: Minimal Assistance - Patient > 75%     Toileting Toileting    Toileting assist Assist for toileting: Maximal Assistance - Patient 25 - 49% (Steady)     Transfers Chair/bed  transfer  Transfers assist     Chair/bed transfer assist level: Minimal Assistance - Patient > 75%     Locomotion Ambulation   Ambulation assist   Ambulation activity did not occur: Safety/medical concerns (pain, fatigue, weakness/deconditioning, decreased balance)  Assist level: 2 helpers Assistive device: Walker-rolling Max distance: 24f   Walk 10 feet activity   Assist  Walk 10 feet activity did not occur: Safety/medical concerns (pain, fatigue, weakness/deconditioning, decreased balance)  Assist level: 2 helpers Assistive device: Walker-rolling   Walk 50 feet activity   Assist Walk 50 feet with 2 turns activity did not occur: Safety/medical concerns (pain, fatigue, weakness/deconditioning, decreased balance)         Walk 150 feet activity   Assist Walk 150 feet activity did not occur: Safety/medical concerns (pain, fatigue, weakness/deconditioning, decreased balance)         Walk 10 feet on uneven surface  activity   Assist Walk 10 feet on uneven surfaces activity did not occur: Safety/medical concerns (pain, fatigue, weakness/deconditioning, decreased balance)         Wheelchair     Assist Is the patient using a wheelchair?: Yes Type of Wheelchair: Manual    Wheelchair assist level: Supervision/Verbal cueing Max wheelchair distance: 1589f   Wheelchair 50 feet with 2 turns activity    Assist        Assist Level: Supervision/Verbal cueing   Wheelchair 150 feet activity     Assist      Assist Level: Supervision/Verbal cueing   Blood pressure (!) 115/48, pulse 84, temperature 97.7 F (36.5 C), resp. rate 18, height '5\' 5"'$  (1.651 m), weight 62.7 kg, SpO2 100 %.    Medical Problem List and Plan: 1. Functional deficits secondary to septic shock/metabolic with debility and encephalopathy. Pt appears cognitively to be at baseline. -  Antibiotic therapy 10/29 - 11/6             -patient may shower if left hip is covered              -ELOS/Goals: 10-12 days, mod I to supervision goals  Continue CIR 2.  Impaired mobility: working on sit to stands. Provided accomodation letter for ramp and bathroom. -DVT/anticoagulation:  Pharmaceutical: continue Heparin given bleeding risk             -antiplatelet therapy: Aspirin 81 mg daily and Plavix 75 mg daily 3. Left foot pain: changed Lidoderm patch to bottom of right foot, oxycodone as needed. Provided list of foods for pain relief. Manuka honey ordered for wound healing.  4. Mood/Behavior/Sleep: Provide emotional support             -antipsychotic agents: N/A 5. Neuropsych/cognition: This patient is capable of making  decisions on her own behalf. 6. Left hip incision: Continue BID to TID dressing changes to left hip. Discussed with patient that surgeon stated continued drainage is to be expected -WOC follow-up for left hip drainage. -completed course of broad spectrum abx on 11/6   -Assistance provided by orthopedics. 7. Fluids/Electrolytes/Nutrition: Routine in and outs with follow-up chemistries 8.  Peripheral vascular disease.  Follow-up vascular surgery for occluded stents status post right common iliac left common iliac stenting with balloon angioplasty left common femoral and stent left SFA 11/16/2021 per Dr. [name] 9.  Tumoral calcinosis.  Recent excision of tumoral calcinosis left hip thigh at [facility] 10/20/2021.  Weightbearing as tolerated. Communicated MRI results to ortho 10.  End-stage renal disease.  Continue hemodialysis as directed. Discussed improved Creatinine to 3.08 on 11/14.  11.  Acute on chronic anemia.  Continue iron supplement 12.  Leukocytosis.  (20k)  -Broad spectrum antibiotic therapy completed 11/16/2021 and monitor.  Latest blood culture showed no growth             -appearance of drainage is concerning. Recent CT (10/30) demonstrating changes c/w tumoral calcinosis and mild cellulitis.              -MRI hip obtianed and stable               -ischemic toes LLE could also be a source             -continue wound care as above             -pt feels well otherwise and no other signs of infection             -consider H/O consult if persistent 13.  Asthma without exacerbation as well as remote history of tobacco abuse.  Continue inhalers as directed check oxygen saturations every shift 14. Diet controlled Diabetes mellitus. Blood sugar checks discontinued 15.  Hypertension.  Toprol-XL 25 mg daily.  Monitor with increased mobility 16.  Abrupt onset bilateral sensorineural hearing loss.  Confirmed by audiology 11/7 the symptoms have resolved.  Follow-up outpatient. 17.  Diastolic and systolic congestive heart failure.  Monitor for any signs of fluid overload. Daily weights ordered 18. Pruritus: sarna lotion ordered.  19. Diarrhea: continue imodium 20. Insomnia: secondary to diarrhea, continue imodium.   LOS: 4 days A FACE TO FACE EVALUATION WAS PERFORMED  Clide Deutscher Noelene Gang 11/27/2021, 2:48 PM

## 2021-11-27 NOTE — Progress Notes (Signed)
Occupational Therapy Session Note  Patient Details  Name: Susan Horton MRN: [id number] Date of Birth: 06-Aug-1970  Today's Date: 11/27/2021 OT Individual Time: 1100-1200 (60 minutes)     Short Term Goals: Week 1:  OT Short Term Goal 1 (Week 1): Pt will perform STS transfers with Mod A + LRAD in preparation ADL tasks. OT Short Term Goal 2 (Week 1): Pt will engage in standing table-top activities >2 minutes with Mod A + LRAD in preparation for ADL tasks. OT Short Term Goal 3 (Week 1): Pt will demonstrate carry-over of LB compensatory dressing techniques with supervision.  Skilled Therapeutic Interventions/Progress Updates:  Pt received seated in [facility] for skilled OT session with focus on IADL retraining and UE strengthening. Pt agreeable to interventions, with moderate fatigue and un-rated pain in L-foot. OT offered intermediate rest breaks and positioning suggestions throughout session to address pain/fatigue and maximize participation/safety in session.   Pt participated in simulated meal prepping activity in ADL apartment, focused on retrieval of needed items and set-up of environment for cooking a simple breakfast meal. Pt able to walk room level distance with CGA + RW (and 1 standing break) to retrieve items from the refrigerator and cabinet, and placing them on the stove. While taking a seated rest break, pt was educated on energy conservation techniques to use in the kitchen (placing items in close proximity) and use of RW tray/pouch to safely carry items to dining table. Pt then simulates carrying plate with use of RW tray towards table in ADL apartment, successfully moving with CGA + RW.  In pt's room, pt completes 1 set/10 reps of seated single-arm rows, scapular squeezes, and shoulder internal/external rotations using red thera-band. Pt requiring Vcs for proper form to avoid compensation.   Pt remained seated in [facility] with all immediate needs meet at end of session. Pt continues to be  appropriate for skilled OT intervention to address functional mobility and activity tolerance promoting further functional independence.    Therapy Documentation Precautions:  Precautions Precautions: Fall Precaution Comments: L foot drop, bilateral intermittent hearing loss Restrictions Weight Bearing Restrictions: No   Therapy/Group: Individual Therapy  Maudie Mercury, OTR/L, MSOT   11/27/2021, 11:03 AM

## 2021-11-28 DIAGNOSIS — G9341 Metabolic encephalopathy: Secondary | ICD-10-CM

## 2021-11-28 DIAGNOSIS — G4700 Insomnia, unspecified: Secondary | ICD-10-CM

## 2021-11-28 DIAGNOSIS — A419 Sepsis, unspecified organism: Secondary | ICD-10-CM

## 2021-11-28 DIAGNOSIS — R6520 Severe sepsis without septic shock: Secondary | ICD-10-CM

## 2021-11-28 DIAGNOSIS — I10 Essential (primary) hypertension: Secondary | ICD-10-CM | POA: Diagnosis not present

## 2021-11-28 DIAGNOSIS — D72829 Elevated white blood cell count, unspecified: Secondary | ICD-10-CM

## 2021-11-28 DIAGNOSIS — I5042 Chronic combined systolic (congestive) and diastolic (congestive) heart failure: Secondary | ICD-10-CM

## 2021-11-28 MED ORDER — MELATONIN 3 MG PO TABS
3.0000 mg | ORAL_TABLET | Freq: Every evening | ORAL | Status: DC | PRN
  Administered 2021-11-28 – 2021-12-08 (×7): 3 mg via ORAL
  Filled 2021-11-28 (×7): qty 1

## 2021-11-28 MED ORDER — METOPROLOL SUCCINATE ER 25 MG PO TB24
12.5000 mg | ORAL_TABLET | Freq: Every day | ORAL | Status: DC
  Administered 2021-11-29 – 2021-12-14 (×13): 12.5 mg via ORAL
  Filled 2021-11-28 (×16): qty 1

## 2021-11-28 NOTE — Progress Notes (Signed)
Physical Therapy Session Note  Patient Details  Name: Susan Horton MRN: [id number] Date of Birth: 1970-05-24  Today's Date: 11/28/2021 PT Individual Time: 4174-0814 PT Individual Time Calculation (min): 70 min   Short Term Goals: Week 1:  PT Short Term Goal 1 (Week 1): pt will transfer sit<>stand with LRAD and mod A consistantly PT Short Term Goal 2 (Week 1): pt will transfer bed<>chair with LRAD and min A PT Short Term Goal 3 (Week 1): pt will ambulate 20f with LRAD and mod A  Skilled Therapeutic Interventions/Progress Updates:   Received pt sitting in WC, pt agreeable to PT treatment, and reported pain 8.5/10 in L foot (premedicated). Pt also reported not sleeping well last night and completely exhausted from dialysis yesterday - notified RN of request for sleeping aid. Session with emphasis on functional mobility/transfers, generalized strengthening and endurance, and gait training. Pt performed WC mobility 721fusing BUE and supervision - limited by fatigue and transported remainder of way to dayroom in WC dependently. Placed pillow on elevating legrest and secured with co-boad for comfort on L calf, then pt performed seated BLE strengthening on Kinetron at 50 cm/sec for 1 minute x 4 trials with BUE support with emphasis on glute/quad strength and weight bearing through L foot.   Donned ted hose to RLE for edema management and pt stood with RW and heavy min A and ambulated 2029fith RW and CGA/close supervision with close WC follow. Pt limited by fatigue, pain in bilateral feet, and increased "tightness" in legs. Due to fatigue, pt declined any further ambulation or standing but agreeable to seated exercises. Pt performed LAQ 3x10 bilaterally with 1.5lb ankle weight - placed 3in step under L foot for support/comfort. Pt only able to make it through 1x10 on L with ankle weight before asking to remove weight due to fatigue. Returned to room and pt requested to sit in recliner. Transferred  WC<>recliner stand<>pivot with RW and CGA (mod A to stand) and concluded session with pt sitting in recliner, needs within reach, and seatbelt alarm on.   Therapy Documentation Precautions:  Precautions Precautions: Fall Precaution Comments: L foot drop, bilateral intermittent hearing loss Restrictions Weight Bearing Restrictions: No  Therapy/Group: Individual Therapy AnnAlfonse Alpers, DPT  11/28/2021, 7:09 AM

## 2021-11-28 NOTE — Progress Notes (Signed)
PROGRESS NOTE   Subjective/Complaints: Reports poor sleep last night.  She would like sleep medication as needed. No additional concerns.   LBM yesterday  ROS: +pruritus, +foot pain well controlled. +left hip pain, +diarrhea- improved with imodium, Denies CP or SOB + insomnia   Objective:   No results found. No results for input(s): "WBC", "HGB", "HCT", "PLT" in the last 72 hours.  No results for input(s): "NA", "K", "CL", "CO2", "GLUCOSE", "BUN", "CREATININE", "CALCIUM" in the last 72 hours.   Intake/Output Summary (Last 24 hours) at 11/28/2021 1157 Last data filed at 11/28/2021 0933 Gross per 24 hour  Intake 240 ml  Output --  Net 240 ml      Pressure Injury 11/15/21 Sacrum Medial Stage 2 -  Partial thickness loss of dermis presenting as a shallow open injury with a red, pink wound bed without slough. (Active)  11/15/21 1030  Location: Sacrum  Location Orientation: Medial  Staging: Stage 2 -  Partial thickness loss of dermis presenting as a shallow open injury with a red, pink wound bed without slough.  Wound Description (Comments):   Present on Admission: Yes    Physical Exam: Vital Signs Blood pressure (!) 137/58, pulse 96, temperature 98.1 F (36.7 C), temperature source Oral, resp. rate 18, height '5\' 5"'$  (1.651 m), weight 62.7 kg, SpO2 94 %. Gen: no distress, normal appearing, BMI 22.71 HEENT: oral mucosa pink and moist, NCAT, working with therapy in gym, sitting in [facility] Cardio: RRR Chest: CTAB, normal effort, normal rate of breathing Abd: soft, non-distended, +BS Ext: no edema Psych: pleasant, normal affect Musculoskeletal:     Cervical back: Normal range of motion.     Comments: Left hip tender with ROM, appears swollen. Large dressing in place. Left foot hypersensitive to touch.  Skin:    Comments:  Left hip Xeroform dressing in place.  2 incisions left posterior lateral thigh without odor but with  yellowish/tan drainage with clumpy, white debris. No odor. Wounds themselves appear pink without necrotic tissue.  Left foot: tip of great toe and 2nd/third toes appear ischemic. Skin on dorsum of left foot partially peeled due to lidocaine patch. AVF in LUE  Stage 2 to sacrum Neurological:     Mental Status: She is alert.     Comments:    Alert and oriented x 3. Normal insight and awareness. Intact Memory. Normal language and speech. Cranial nerve exam unremarkable. UE grossly 5/5. RLE 3+ to 4/5 prox to 4+/5 distally. LLE 2/5 prox to trace/5 ankle. Stocking glove sensory loss bilateral LE, L>R up to mid calf.   Psychiatric:        Mood and Affect: Mood normal.        Behavior: Behavior normal.    Assessment/Plan: 1. Functional deficits which require 3+ hours per day of interdisciplinary therapy in a comprehensive inpatient rehab setting. Physiatrist is providing close team supervision and 24 hour management of active medical problems listed below. Physiatrist and rehab team continue to assess barriers to discharge/monitor patient progress toward functional and medical goals  Care Tool:  Bathing    Body parts bathed by patient: Right arm, Left arm, Chest, Abdomen, Front perineal area, Right upper  leg, Left upper leg, Right lower leg, Left lower leg, Face   Body parts bathed by helper: Buttocks     Bathing assist Assist Level: Contact Guard/Touching assist Assistive Device Comment: stand pivot, shower chair   Upper Body Dressing/Undressing Upper body dressing   What is the patient wearing?: Pull over shirt    Upper body assist Assist Level: Set up assist Assistive Device Comment: Seated on WC  Lower Body Dressing/Undressing Lower body dressing      What is the patient wearing?: Pants, Underwear/pull up     Lower body assist Assist for lower body dressing: Minimal Assistance - Patient > 75%     Toileting Toileting    Toileting assist Assist for toileting: Maximal  Assistance - Patient 25 - 49% (Steady)     Transfers Chair/bed transfer  Transfers assist     Chair/bed transfer assist level: Contact Guard/Touching assist     Locomotion Ambulation   Ambulation assist   Ambulation activity did not occur: Safety/medical concerns (pain, fatigue, weakness/deconditioning, decreased balance)  Assist level: Contact Guard/Touching assist Assistive device: Walker-rolling Max distance: 67f   Walk 10 feet activity   Assist  Walk 10 feet activity did not occur: Safety/medical concerns (pain, fatigue, weakness/deconditioning, decreased balance)  Assist level: Contact Guard/Touching assist Assistive device: Walker-rolling   Walk 50 feet activity   Assist Walk 50 feet with 2 turns activity did not occur: Safety/medical concerns (pain, fatigue, weakness/deconditioning, decreased balance)         Walk 150 feet activity   Assist Walk 150 feet activity did not occur: Safety/medical concerns (pain, fatigue, weakness/deconditioning, decreased balance)         Walk 10 feet on uneven surface  activity   Assist Walk 10 feet on uneven surfaces activity did not occur: Safety/medical concerns (pain, fatigue, weakness/deconditioning, decreased balance)         Wheelchair     Assist Is the patient using a wheelchair?: Yes Type of Wheelchair: Manual    Wheelchair assist level: Supervision/Verbal cueing Max wheelchair distance: 1525f   Wheelchair 50 feet with 2 turns activity    Assist        Assist Level: Supervision/Verbal cueing   Wheelchair 150 feet activity     Assist      Assist Level: Supervision/Verbal cueing   Blood pressure (!) 137/58, pulse 96, temperature 98.1 F (36.7 C), temperature source Oral, resp. rate 18, height '5\' 5"'$  (1.651 m), weight 62.7 kg, SpO2 94 %.    Medical Problem List and Plan: 1. Functional deficits secondary to septic shock/metabolic with debility and encephalopathy. Pt appears  cognitively to be at baseline. -  Antibiotic therapy 10/29 - 11/6             -patient may shower if left hip is covered             -ELOS/Goals: 10-12 days, mod I to supervision goals  Continue CIR PT/OT 2.  Impaired mobility: working on sit to stands. Provided accomodation letter for ramp and bathroom. -DVT/anticoagulation:  Pharmaceutical: continue Heparin given bleeding risk             -antiplatelet therapy: Aspirin 81 mg daily and Plavix 75 mg daily 3. Left foot pain: changed Lidoderm patch to bottom of right foot, oxycodone as needed. Provided list of foods for pain relief. Manuka honey ordered for wound healing.  4. Mood/Behavior/Sleep: Provide emotional support             -antipsychotic agents: N/A  5. Neuropsych/cognition: This patient is capable of making decisions on her own behalf. 6. Left hip incision: Continue BID to TID dressing changes to left hip. Discussed with patient that surgeon stated continued drainage is to be expected -WOC follow-up for left hip drainage. -completed course of broad spectrum abx on 11/6   -Assistance provided by orthopedics. 7. Fluids/Electrolytes/Nutrition: Routine in and outs with follow-up chemistries 8.  Peripheral vascular disease.  Follow-up vascular surgery for occluded stents status post right common iliac left common iliac stenting with balloon angioplasty left common femoral and stent left SFA 11/16/2021 per Dr. [name] 9.  Tumoral calcinosis.  Recent excision of tumoral calcinosis left hip thigh at [facility] 10/20/2021.  Weightbearing as tolerated. Communicated MRI results to ortho 10.  End-stage renal disease.  Continue hemodialysis as directed. Discussed improved Creatinine to 3.08 on 11/14.  11.  Acute on chronic anemia.  Continue iron supplement 12.  Leukocytosis.  (20k)  -Broad spectrum antibiotic therapy completed 11/16/2021 and monitor.  Latest blood culture showed no growth             -appearance of drainage is concerning. Recent  CT (10/30) demonstrating changes c/w tumoral calcinosis and mild cellulitis.              -MRI hip obtianed and stable              -ischemic toes LLE could also be a source             -continue wound care as above             -pt feels well otherwise and no other signs of infection             -consider H/O consult if persistent  -Improved to 17.3 on 11/15, monitor for signs of infection 13.  Asthma without exacerbation as well as remote history of tobacco abuse.  Continue inhalers as directed check oxygen saturations every shift 14. Diet controlled Diabetes mellitus. Blood sugar checks discontinued 15.  Hypertension.  Toprol-XL 25 mg daily.  Monitor with increased mobility  -11/18 Bps a little soft, decreased toprol -xl 12.'5mg'$  16.  Abrupt onset bilateral sensorineural hearing loss.  Confirmed by audiology 11/7 the symptoms have resolved.  Follow-up outpatient. 17.  Diastolic and systolic congestive heart failure.  Monitor for any signs of fluid overload. Daily weights ordered Filed Weights   11/25/21 1654 11/26/21 0503 11/27/21 0500  Weight: 61.1 kg 61.2 kg 62.7 kg   Weights relatively stable, continue to monitor 18. Pruritus: sarna lotion ordered.  19. Diarrhea: continue imodium 20. Insomnia: secondary to diarrhea, continue imodium.   -11/17 Start melatonin PRN  LOS: 5 days A FACE TO FACE EVALUATION WAS PERFORMED  Jennye Boroughs 11/28/2021, 11:57 AM

## 2021-11-28 NOTE — Progress Notes (Signed)
Occupational Therapy Session Note  Patient Details  Name: Susan Horton MRN: [id number] Date of Birth: [birth year]  Today's Date: 11/28/2021 OT Individual Time: 1103-1200 and 1330-1420 OT Individual Time Calculation (min): 57 min and 50 min    Short Term Goals: Week 1:  OT Short Term Goal 1 (Week 1): Pt will perform STS transfers with Mod A + LRAD in preparation ADL tasks. OT Short Term Goal 2 (Week 1): Pt will engage in standing table-top activities >2 minutes with Mod A + LRAD in preparation for ADL tasks. OT Short Term Goal 3 (Week 1): Pt will demonstrate carry-over of LB compensatory dressing techniques with supervision.  Skilled Therapeutic Interventions/Progress Updates:    Session 1: OT session focused on sit<>stand, functional endurance, and LB dressing. Pt received in recliner chair verbalizing fatigue from dialysis last night, however agreeable to therapy. Pt requesting dressing change for wound with RN notified. Completed lateral leans to remove pants with max assist required. Following dressing change, pt completed sit<>stand with mod A as OT assisted with pulling pants up. Completed stand pivot transfer to w/c with RW and min A. Pt propelled self in w/c with increased time to promote UB strength. Completed tasks in standing 3x ranging from 2:00-2:30 each and completing sit<>stand with min A. At end of session, pt returned to room and left in w/c with all needs in reach.   Session 2: OT session focused on BUE strengthening and functional endurance for ADLs and transfers. Pt received sitting in w/c verbalizing fatigue and asking to complete session in room. Completed 2# dowel bar exercises of chest press and bicep curl 4x 10 reps and theraband (red) 3x 10 for rows and tricep extension. Pt's family and friends arriving at end of session with pt asking to remain in chair despite fatigue.    Therapy Documentation Precautions:  Precautions Precautions: Fall Precaution Comments: L foot  drop, bilateral intermittent hearing loss Restrictions Weight Bearing Restrictions: No General:   Vital Signs:  Pain: Pain Assessment Pain Scale: 0-10 Pain Score: 8  Pain Type: Acute pain Pain Location: Leg Pain Orientation: Left Pain Descriptors / Indicators: Aching Pain Frequency: Constant Pain Onset: On-going Patients Stated Pain Goal: 1 Pain Intervention(s): Medication (See eMAR) ADL: ADL Upper Body Bathing: Setup Where Assessed-Upper Body Bathing: Shower Lower Body Bathing: Moderate cueing, Minimal assistance Where Assessed-Lower Body Bathing: Shower Upper Body Dressing: Setup Where Assessed-Upper Body Dressing:  (Seated on TB) Lower Body Dressing: Moderate assistance, Moderate cueing Where Assessed-Lower Body Dressing: Edge of bed Toileting: Moderate assistance Where Assessed-Toileting: Glass blower/designer: Moderate assistance Toilet Transfer Method: Stand pivot Toilet Transfer Equipment: Energy manager: Moderate assistance Social research officer, government Method:  Clarise Cruz Steady) Youth worker: Civil engineer, contracting with back, Estate agent   Exercises:   Other Treatments:     Therapy/Group: Individual Therapy  Duayne Cal 11/28/2021, 12:02 PM

## 2021-11-28 NOTE — Progress Notes (Signed)
[facility] Progress Note   Subjective:   Patient seen and examined in room.  Sitting in wheelchair waiting for PT.  Feels like she is getting stronger.  Continues to have pain in her leg but it is better.  Denies CP, SOB, abdominal pain and n/v/d.     Objective Vitals:   11/27/21 1741 11/27/21 1811 11/27/21 1822 11/28/21 0700  BP: (!) 86/74 (!) 102/46 102/61 (!) 137/58  Pulse:   90 96  Resp:    18  Temp:   98.2 F (36.8 C) 98.1 F (36.7 C)  TempSrc:   Oral Oral  SpO2:    94%  Weight:      Height:       Physical Exam General:WDWN female in NAD Heart:RRR, no mrg Lungs:CTAB, nml WOB on RA Abdomen:soft, NTND Extremities:trace LE edema Dialysis Access: LU AVF =b/t   Filed Weights   11/25/21 1654 11/26/21 0503 11/27/21 0500  Weight: 61.1 kg 61.2 kg 62.7 kg    Intake/Output Summary (Last 24 hours) at 11/28/2021 1302 Last data filed at 11/28/2021 0933 Gross per 24 hour  Intake 240 ml  Output --  Net 240 ml    Additional Objective Labs: Basic Metabolic Panel: Recent Labs  Lab 11/22/21 0111 11/23/21 0550 11/24/21 0034  NA 139 137 137  K 3.4* 3.7 4.2  CL 95* 96* 94*  CO2 '26 26 25  '$ GLUCOSE 169* 110* 120*  BUN 29* 39* 19  CREATININE 4.35* 5.72* 3.08*  CALCIUM 9.4 9.2 9.1  PHOS 6.1* 6.8* 4.2   Liver Function Tests: Recent Labs  Lab 11/22/21 0111 11/23/21 0550 11/24/21 0034  ALBUMIN 1.8* 1.9* 1.8*   CBC: Recent Labs  Lab 11/22/21 0111 11/23/21 0550 11/24/21 0034 11/25/21 0645  WBC 20.2* 20.2* 22.2* 17.3*  NEUTROABS  --  17.1*  --  14.2*  HGB 7.3* 7.9* 7.3* 7.8*  HCT 24.9* 27.7* 25.9* 26.7*  MCV 80.8 82.9 83.8 81.4  PLT 490* 525* 508* 533*   Blood Culture    Component Value Date/Time   SDES WOUND HIP 11/12/2021 1425   SPECREQUEST  LEFT 11/12/2021 1425   CULT  11/12/2021 1425    NO GROWTH 2 DAYS Performed at [facility], [place] [address], [place], [place] [address]    REPTSTATUS 11/14/2021 FINAL 11/12/2021 1425     Medications:   aspirin  81 mg Oral Daily   atorvastatin  40 mg Oral Daily   Chlorhexidine Gluconate Cloth  6 each Topical Q0600   clopidogrel  75 mg Oral Q breakfast   darbepoetin (ARANESP) injection - NON-DIALYSIS  200 mcg Subcutaneous Q Wed-1800   doxercalciferol  7 mcg Intravenous Q M,W,F-HD   heparin  5,000 Units Subcutaneous Q8H   leptospermum manuka honey  1 Application Topical Daily   lidocaine  1 patch Transdermal Q24H   metoCLOPramide  10 mg Oral TID AC   [START ON 11/29/2021] metoprolol succinate  12.5 mg Oral Daily   montelukast  10 mg Oral QHS   pantoprazole  40 mg Oral Daily   thiamine  100 mg Oral Daily    Dialysis Orders: [facility]WF [facility] 3h 89mn 400/600  61kg  2/2 bath P2  Hep 4000  LUE AVF - mircera 200 mcg IV q2, last 10/16, due 10/30 - venofer '100mg'$  IV tiw thru 11/06 - doxercalciferol 7 ug tiw - sod thiosulfate 25 gm iv tiw   Assessment/Plan: Septic shock - sp course of Zosyn, now resolved > CIR d/t debility  ESRD:  Required CRRT on admit, now back to [facility] MWF. Technically should run tomorrow due to Thanksgiving Holiday schedule but since Rehab is already scheduled around MWF schedule, and tentative d/c date 12/2, will continue on MWF next week.  Next HD Monday. HTN/volume: BP controlled, euvolemic on exam.  Anemia of ESRD: Hgb 7s. Continue max dose Aranesp 241mg weekly while here. No IV Fe with ferritin >5000 Secondary HPTH: CorrCa ok, last phos in goal. She has trouble tolerating any binder secondary to gastroparesis-we will hold binder for now and follow trend - was on Fosrenol, no iron binders with ferritin 5000 Nutrition: Alb low,  continue supplements sometimes refusing Nepro Encephalopathy - D/t shock/meds?, better. Had some hearing loss and foot drop 11/7 now spontaneously improving. No offending meds Myoclonus: severe, after initial consult. Felt due to meds (cefepime, gabapentin and narcotics). Resolved.  Tumoral calcinosis L hip - long-standing painful  issue for her, on NaThio empirically as op,although hasn't helped. [facility]. Recultured 11/2 - NGTD. T2DM with severe gastroparesis - Ongoing issue, s/p esophageal myotomy 09/17/21. Has issues swallow/keeping pills down. L foot pain - Xray negative, uric acid normal. VVS consulted, s/p LE angiography with improvement in pain Leukocytosis - improving.  17.3 today. Per PMD.  LJen Mow PA-C [facility] 11/28/2021,1:02 PM  LOS: 5 days

## 2021-11-29 LAB — HEPATITIS B SURFACE ANTIGEN: Hepatitis B Surface Ag: NONREACTIVE

## 2021-11-29 MED ORDER — PNEUMOCOCCAL 20-VAL CONJ VACC 0.5 ML IM SUSY
0.5000 mL | PREFILLED_SYRINGE | INTRAMUSCULAR | Status: DC
  Filled 2021-11-29: qty 0.5

## 2021-11-29 MED ORDER — CHLORHEXIDINE GLUCONATE CLOTH 2 % EX PADS
6.0000 | MEDICATED_PAD | Freq: Every day | CUTANEOUS | Status: DC
  Administered 2021-12-02 – 2021-12-04 (×2): 6 via TOPICAL

## 2021-11-29 NOTE — Progress Notes (Signed)
Susan Horton Progress Note   Subjective:   Patient seen and examined in room.  Eating breakfast.  Slept better last night after getting medication.  Reports being tired.  Glad she has a day off from therapy and HD to rest.  Admits to new edema in her feet, improving with elevation.  Believes it is due to being up more than usual.  No other specific complaints.  Pain well controlled.  Denies fever, chills, CP, SOB, abdominal pain and n/v/d.   Objective Vitals:   11/28/21 0700 11/28/21 1510 11/28/21 1829 11/29/21 0540  BP: (!) 137/58 (!) 130/49 (!) 90/43 (!) 116/45  Pulse: 96 81 77 74  Resp: '18 16 16 18  '$ Temp: 98.1 F (36.7 C) 97.7 F (36.5 C) 97.9 F (36.6 C) 98.9 F (37.2 C)  TempSrc: Oral Oral Oral Oral  SpO2: 94%   95%  Weight:    65 kg  Height:       Physical Exam General:WDWN female in NAD Heart:RRR, no mrg Lungs:CTAB, nml WOB on RA Abdomen:soft, NTND Extremities:trace pedal edema Dialysis Access: LU AVF +b/t   Filed Weights   11/26/21 0503 11/27/21 0500 11/29/21 0540  Weight: 61.2 kg 62.7 kg 65 kg    Intake/Output Summary (Last 24 hours) at 11/29/2021 0829 Last data filed at 11/29/2021 0725 Gross per 24 hour  Intake 1080 ml  Output --  Net 1080 ml    Additional Objective Labs: Basic Metabolic Panel: Recent Labs  Lab 11/23/21 0550 11/24/21 0034  NA 137 137  K 3.7 4.2  CL 96* 94*  CO2 26 25  GLUCOSE 110* 120*  BUN 39* 19  CREATININE 5.72* 3.08*  CALCIUM 9.2 9.1  PHOS 6.8* 4.2   Liver Function Tests: Recent Labs  Lab 11/23/21 0550 11/24/21 0034  ALBUMIN 1.9* 1.8*   CBC: Recent Labs  Lab 11/23/21 0550 11/24/21 0034 11/25/21 0645  WBC 20.2* 22.2* 17.3*  NEUTROABS 17.1*  --  14.2*  HGB 7.9* 7.3* 7.8*  HCT 27.7* 25.9* 26.7*  MCV 82.9 83.8 81.4  PLT 525* 508* 533*   Blood Culture    Component Value Date/Time   SDES WOUND HIP 11/12/2021 1425   SPECREQUEST  LEFT 11/12/2021 1425   CULT  11/12/2021 1425    NO GROWTH 2  DAYS Performed at [facility], [place] [address], [place], [place] [address]    REPTSTATUS 11/14/2021 FINAL 11/12/2021 1425   Medications:   aspirin  81 mg Oral Daily   atorvastatin  40 mg Oral Daily   Chlorhexidine Gluconate Cloth  6 each Topical Q0600   clopidogrel  75 mg Oral Q breakfast   darbepoetin (ARANESP) injection - NON-DIALYSIS  200 mcg Subcutaneous Q Wed-1800   doxercalciferol  7 mcg Intravenous Q M,W,F-HD   heparin  5,000 Units Subcutaneous Q8H   leptospermum manuka honey  1 Application Topical Daily   lidocaine  1 patch Transdermal Q24H   metoCLOPramide  10 mg Oral TID AC   metoprolol succinate  12.5 mg Oral Daily   montelukast  10 mg Oral QHS   pantoprazole  40 mg Oral Daily   thiamine  100 mg Oral Daily    Dialysis Orders: [facility]WF [facility] 3h 67mn 400/600  61kg  2/2 bath P2  Hep 4000  LUE AVF - mircera 200 mcg IV q2, last 10/16, due 10/30 - venofer '100mg'$  IV tiw thru 11/06 - doxercalciferol 7 ug tiw - sod thiosulfate 25 gm iv tiw   Assessment/Plan: Septic shock -  sp course of Zosyn, now resolved > CIR d/t debility  ESRD: Required CRRT on admit, now back to [facility] MWF. Plan to continue regular MWF schedule so not to disrupt rehab schedule.  Will not run on Holiday schedule.  Next HD tomorrow.  HTN/volume: BP controlled, mostly euvolemic on exam.  Anemia of ESRD: Hgb 7s. Continue max dose Aranesp 234mg weekly while here. No IV Fe with ferritin >5000 Secondary HPTH: CorrCa ok, last phos in goal. She has trouble tolerating any binder secondary to gastroparesis-we will hold binder for now and follow trend - was on Fosrenol, no iron binders with ferritin 5000 Nutrition: Alb low,  continue supplements sometimes refusing Nepro Encephalopathy - D/t shock/meds?, better. Had some hearing loss and foot drop 11/7 now spontaneously improving. No offending meds Myoclonus: severe, after initial consult. Felt due to meds (cefepime, gabapentin and narcotics). Resolved.  Tumoral  calcinosis L hip - long-standing painful issue for her, on NaThio empirically as op,although hasn't helped. [facility]. Recultured 11/2 - NGTD. T2DM with severe gastroparesis - Ongoing issue, s/p esophageal myotomy 09/17/21. Has issues swallow/keeping pills down. L foot pain - Xray negative, uric acid normal. VVS consulted, s/p LE angiography with improvement in pain Leukocytosis - improving. Last 17.3. Per PMD. Dispo: Tentative d/c plan of 12/12/21  LJen Mow PA-C [facility] 11/29/2021,8:29 AM  LOS: 6 days

## 2021-11-30 DIAGNOSIS — R5381 Other malaise: Secondary | ICD-10-CM | POA: Diagnosis not present

## 2021-11-30 DIAGNOSIS — G9341 Metabolic encephalopathy: Secondary | ICD-10-CM | POA: Diagnosis not present

## 2021-11-30 LAB — RENAL FUNCTION PANEL
Albumin: 1.9 g/dL — ABNORMAL LOW (ref 3.5–5.0)
Anion gap: 19 — ABNORMAL HIGH (ref 5–15)
BUN: 57 mg/dL — ABNORMAL HIGH (ref 6–20)
CO2: 23 mmol/L (ref 22–32)
Calcium: 8.3 mg/dL — ABNORMAL LOW (ref 8.9–10.3)
Chloride: 96 mmol/L — ABNORMAL LOW (ref 98–111)
Creatinine, Ser: 6.48 mg/dL — ABNORMAL HIGH (ref 0.44–1.00)
GFR, Estimated: 7 mL/min — ABNORMAL LOW (ref >60)
Glucose, Bld: 88 mg/dL (ref 70–99)
Phosphorus: 6 mg/dL — ABNORMAL HIGH (ref 2.5–4.6)
Potassium: 3.7 mmol/L (ref 3.5–5.1)
Sodium: 138 mmol/L (ref 135–145)

## 2021-11-30 LAB — CBC
HCT: 26.6 % — ABNORMAL LOW (ref 36.0–46.0)
Hemoglobin: 7.5 g/dL — ABNORMAL LOW (ref 12.0–15.0)
MCH: 23.4 pg — ABNORMAL LOW (ref 26.0–34.0)
MCHC: 28.2 g/dL — ABNORMAL LOW (ref 30.0–36.0)
MCV: 83.1 fL (ref 80.0–100.0)
Platelets: 499 10*3/uL — ABNORMAL HIGH (ref 150–400)
RBC: 3.2 MIL/uL — ABNORMAL LOW (ref 3.87–5.11)
RDW: 20 % — ABNORMAL HIGH (ref 11.5–15.5)
WBC: 21.8 10*3/uL — ABNORMAL HIGH (ref 4.0–10.5)
nRBC: 0 % (ref 0.0–0.2)

## 2021-11-30 MED ORDER — FLUCONAZOLE 100MG IVPB
100.0000 mg | Freq: Once | INTRAVENOUS | Status: DC
  Filled 2021-11-30: qty 50

## 2021-11-30 MED ORDER — HEPARIN SODIUM (PORCINE) 1000 UNIT/ML IJ SOLN
INTRAMUSCULAR | Status: AC
  Filled 2021-11-30: qty 4

## 2021-11-30 MED ORDER — FLUCONAZOLE 100 MG PO TABS
100.0000 mg | ORAL_TABLET | Freq: Every day | ORAL | Status: DC
  Administered 2021-12-01 – 2021-12-03 (×3): 100 mg via ORAL
  Filled 2021-11-30 (×4): qty 1

## 2021-11-30 NOTE — Progress Notes (Signed)
Pt continues to decline Heparin. States both arms and stomach are hurting and have knots.

## 2021-11-30 NOTE — Progress Notes (Signed)
Occupational Therapy Weekly Progress Note  Patient Details  Name: Susan Horton MRN: [id number] Date of Birth: 1970/01/26  Beginning of progress report period: November 24, 2021 End of progress report period: November 30, 2021   Patient has met 3 of 3 short term goals. Pt is making steady progress towards LTGs. Pt varies depending on pain and fatigue with min to mod A sit > stand and CGA o min A ambulatory transfer level with RW. She is able to bathe at an overall CGA level, dress with min A and requires min assist for toileting tasks. Pt continues to demonstrate high levels of LLE pain/sensitivity, though has improved since admission with ability to place about 60% WB during functional mobility and tasks. She also demonstrates endurance deficits, BUE and generalized weakness, and balance deficits resulting in difficulty completing BADL tasks without increased physical assist. Pt will benefit from continued skilled OT services to focus on mentioned deficits as well as educate family for supervision level assist at discharge.  Patient continues to demonstrate the following deficits: muscle weakness, decreased cardiorespiratoy endurance, decreased coordination, and decreased standing balance and decreased balance strategies and therefore will continue to benefit from skilled OT intervention to enhance overall performance with BADL, iADL, and Reduce care partner burden.  Patient progressing toward long term goals..  Continue plan of care.  OT Short Term Goals Week 1:  OT Short Term Goal 1 (Week 1): Pt will perform STS transfers with Mod A + LRAD in preparation ADL tasks. OT Short Term Goal 1 - Progress (Week 1): Met OT Short Term Goal 2 (Week 1): Pt will engage in standing table-top activities >2 minutes with Mod A + LRAD in preparation for ADL tasks. OT Short Term Goal 2 - Progress (Week 1): Met OT Short Term Goal 3 (Week 1): Pt will demonstrate carry-over of LB compensatory dressing techniques with  supervision. OT Short Term Goal 3 - Progress (Week 1): Met Week 2:  OT Short Term Goal 1 (Week 2): Pt will complete shower transfer with CGA using LRAD OT Short Term Goal 2 (Week 2): Pt will complete ambulatory toilet transfer with CGA using LRAD OT Short Term Goal 3 (Week 2): Pt will tolerate greater than 5 mins in stance using LRAD during table top activity in prep for ADL needs    Blase Mess, MS, OTR/L  11/30/2021, 7:53 AM

## 2021-11-30 NOTE — Consult Note (Signed)
Neuropsychological Consultation   Patient:   Susan Horton   DOB:   April 01, 1970  MR Number:  [id number]  Location:  [facility] [address] [place] [id number] [place] [address] Dept: [place]: [phone]           Date of Service:   11/30/2021  Start Time:   9 AM End Time:   10 AM  Provider/Observer:  Ilean Skill, Psy.D.       Clinical Neuropsychologist       Billing Code/Service: [phone]  Chief Complaint:    Susan Horton is a 51 year old right-handed female who was referred for neuropsychological consultation due to coping and adjustment issues and recovering from encephalopathic status after developing encephalopathy due to her medical status with sepsis.  Patient has a past medical history including end-stage renal disease, CAD, hypertension, congestive heart failure, diabetes, peripheral neuropathy, lower extremity DVT, asthma, and peripheral vascular disease.  Patient underwent recent excision of tumoral calcinosis left hip/thigh, drain and sutures were removed on 10/20/2021.  Patient presented on 11/08/2021 with progressive weakness and fall over the prior 2 days.  Patient was found to be febrile as well as tachycardic with mild altered mental status.  Patient was admitted to ICU for possible sepsis and placed on antibiotic.  Cranial CT scan was negative for acute process.  She did have some drainage noted from left hip.  Nephrology did continue hemodialysis.  Patient with significant decrease in functional mobility and admitted to CIR.  Reason for Service:  Patient seen for neuropsychological consult due to coping and adjustment issues and ongoing improvement from encephalopathic status.  Below is the HPI for the current admission.  HPI: Susan Horton is a 51 year old right-handed female with history of end-stage renal disease with hemodialysis, CAD with stenting maintained on aspirin and Plavix,  hypertension, diastolic and systolic congestive heart failure, diabetes mellitus peripheral neuropathy, lower extremity DVT, asthma/quit smoking 5 years ago, peripheral vascular disease with history of left external iliac artery and left SFA stenting.  She underwent recent excision of tumoral calcinosis left hip/thigh, drain and sutures removed 10/20/2021 without evidence of infection per office notes at [facility]  Per chart review use of straight point cane prior to admission.  1 level home.  Plans to stay with her sister on discharge.  Presented 11/08/2021 with progressive weakness and fall since 11/06/2021.  On arrival she is found to be febrile as well as tachycardic with mild altered mental status.  She was admitted to the ICU for possible sepsis and placed on broad-spectrum antibiotics 11/08/2021 - 11/16/2021.  Cranial CT scan negative.  Chest x-ray showed vague hazy opacity right mid and lower lung worrisome for early developing infiltrate/pneumonia.  She did have some noted drainage from the left hip.  X-rays unilateral with and without contrast of pelvis showed no fracture or dislocation identified.  There was a large area of soft tissue calcification previously described as tumoral calcinosis measuring 21 x 15 cm versus 21 x 14 on the comparison study.  CT of left hip showed extensive calcification with areas layering hyperdense sediment involving the proximal extensor, gluteal and flexor compartment muscles of the left thigh with overall appearance most consistent with tumoral calcinosis.  Soft tissue edema in the subcutaneous fat along the proximal lateral hip and thigh reflecting mild cellulitis.  Admission chemistries potassium 6.9 BUN 117 creatinine 10.68, WBC 12,800, hemoglobin 8.8, SARS coronavirus negative, lactic acid 2.0-2.5, procalcitonin  9.57.  Echocardiogram with ejection fraction of 45 to 50% right ventricular systolic function moderately reduced.  Nephrology consulted with  hemodialysis ongoing.  Vascular surgery consulted 11/14/2021 due to left foot tenderness and known peripheral arterial disease and concern for occlusion of her stents..  Aortogram completed undergoing stent of right common iliac artery as well as left common iliac artery with drug-coated balloon angioplasty left common femoral artery and stent of left SFA 11/16/2021 per Dr. [name].  Patient continues to have some leukocytosis with all antibiotic treatment completed 11/08/2021 - 11/16/2021 and lattest WBC 20,200.  Initially concern for possible bacteremia in patient with vascular access on hemodialysis.  Cultures done after antibiotics showing no growth to date and patient remains afebrile.  He remains on Plavix and aspirin as prior to admission.  Subcutaneous heparin was added for DVT prophylaxis.  Patient with abrupt onset bilateral sensorineural hearing loss confirmed by audiology 11/7 though symptoms have improved and recommendations follow-up outpatient.  Therapy evaluations completed due to patient decreased functional mobility was admitted for a comprehensive rehab program.   Current Status:  Patient was awake and oriented but did acknowledge ongoing but improving issues with information processing speed and memory deficits for initial time in the hospital.  Patient is aware of her medical status and able to provide a brief over UVU/review of what is transpired.  Patient was not clear on pertinent issues related to her possible infection and illness.  Patient acknowledges a history where she had adjustment and coping issues roughly 2 years ago but denies any current significant depressive or anxiety type mood state and denies mood disturbance negatively impacting her capacity to participate in therapeutic interventions.  Patient with adequate receptive and expressive language capacity.  Patient evident some difficulties with higher-order executive functioning and problem-solving.  Behavioral Observation: Susan Horton  presents as a 51 y.o.-year-old Right handed African American Female who appeared her stated age. her dress was Appropriate and she was Well Groomed and her manners were Appropriate to the situation.  her participation was indicative of Appropriate and Redirectable behaviors.  There were physical disabilities noted.  she displayed an appropriate level of cooperation and motivation.     Interactions:    Active Appropriate  Attention:   abnormal and attention span appeared shorter than expected for age  Memory:   abnormal; remote memory intact, recent memory impaired  Visuo-spatial:  not examined  Speech (Volume):  low  Speech:   normal; normal  Thought Process:  Coherent and Relevant  Though Content:  WNL; not suicidal and not homicidal  Orientation:   person, place, and time/date  Judgment:   Fair  Planning:   Fair  Affect:    Appropriate  Mood:    Dysphoric  Insight:   Fair  Intelligence:   normal   Medical History:   Past Medical History:  Diagnosis Date   Abnormal stress test    Anemia    Angio-edema    Asthma    CAD (coronary artery disease)    DES to mid LAD July 2018, residual moderate RCA disease   Cataract    CKD (chronic kidney disease) stage 4, GFR 15-29 ml/min (HCC)    Dialysis T/Th/Sa   DVT (deep venous thrombosis) ([place])    1996 during pregnancy, 2015 left leg   Eczema    Essential hypertension 12/11/2015   Gastroparesis    GERD (gastroesophageal reflux disease)    Gluteal abscess 12/27/2018   Hidradenitis    Migraine  Neuropathy    Peripheral vascular disease (HCC)    blood clot in leg   Progressive angina (HCC) 06/05/2014   Chest pain   Type 2 diabetes mellitus ([place])    Type II diabetes mellitus ([place])    Urticaria          Patient Active Problem List   Diagnosis Date Noted   Sensorineural hearing loss (SNHL) of both ears 11/17/2021   Pressure injury of skin 97/02/6376   Acute metabolic encephalopathy    Severe sepsis ([place])     Cellulitis    Anemia due to chronic kidney disease, on chronic dialysis ([place])    Altered mental status    Sepsis ([place]) 11/08/2021   Diabetes mellitus ([place]) 01/29/2021   Hypertension associated with stage 5 chronic kidney disease due to type 2 diabetes mellitus ([place]) 01/29/2021   Tumoral calcinosis 01/29/2021   Dysphagia    Nausea 09/12/2019   Diarrhea, unspecified 09/12/2019   Pruritus, unspecified 08/13/2019   Anaphylactic shock, unspecified, sequela 08/13/2019   Allergy, unspecified, sequela 08/13/2019   Unspecified cataract 03/19/2019   Unspecified asthma with (acute) exacerbation 03/19/2019   COVID-19 03/06/2019   Intractable nausea and vomiting 02/24/2019   Abdominal pain, epigastric 02/24/2019   Pneumonia of right lung due to infectious organism 02/24/2019   Shortness of breath 01/19/2019   Cerebrovascular small vessel disease    Generalized weakness    Abscess of sacrum (HCC)    Gluteal abscess 12/25/2018   Moderate protein-calorie malnutrition ([place]) 12/19/2018   Other disorders resulting from impaired renal tubular function 12/19/2018   Liver disease, unspecified 12/19/2018   Hyperkalemia 12/19/2018   Acidosis 12/19/2018   Hyperlipidemia 11/10/2018   History of arteriosclerotic vascular disease 11/10/2018   Carotid stenosis 11/10/2018   Symptomatic anemia 09/16/2018   Peripheral vascular disease, unspecified ([place]) 09/04/2018   Other disorders of bilirubin metabolism 01/20/2018   Other long term (current) drug therapy 11/28/2017   Other abnormal findings in urine 11/28/2017   Dependence on renal dialysis ([place]) 11/28/2017   ESRD on peritoneal dialysis ([place]) 58/85/0277   Complication of vascular dialysis catheter 09/15/2017   History of anemia due to CKD 07/18/2017   DVT (deep vein thrombosis) in pregnancy 07/18/2017   Encounter for removal of sutures 07/07/2017   Iron deficiency anemia, unspecified 06/30/2017   Secondary hyperparathyroidism of renal origin ([place])  06/24/2017   Pain, unspecified 06/24/2017   Fever, unspecified 06/24/2017   ESRD (end stage renal disease) ([place]) 06/24/2017   Coagulation defect, unspecified ([place]) 06/24/2017   Anemia in chronic kidney disease 06/24/2017   Chronic diastolic CHF (congestive heart failure) ([place]) 06/18/2017   Moderate persistent asthma 06/10/2017   Adjustment disorder with depressed mood 09/08/2016   CKD (chronic kidney disease) stage 4, GFR 15-29 ml/min (HCC)    CAD S/P mLAD PCI with DES 08/06/2016   Presence of drug coated stent in LAD coronary artery 08/06/2016   Atherosclerotic heart disease of native coronary artery without angina pectoris 07/11/2016   Carotid bruit present 06/15/2016   Dyslipidemia 06/15/2016   Smoker 06/15/2016   Neuropathy 03/10/2016   Essential hypertension, benign 12/11/2015   Left facial pain 03/04/2015   Abnormal sense of taste 03/04/2015   Gastroparesis    Uncontrolled type 2 diabetes mellitus with peripheral neuropathy 06/05/2014   Yeast infection of the vagina 07/10/2010    Psychiatric History:  No prior significant psychiatric illness although she did have some difficulties with coping and adjustment roughly 2 years ago.  No current psychotropic medications.  Family Med/Psych History:  Family History  Problem Relation Age of Onset   Allergic rhinitis Mother    Hypertension Father    Allergic rhinitis Father    Hypertension Sister    Allergic rhinitis Sister    Hypertension Brother    Allergic rhinitis Brother    Breast cancer Cousin        x2    Impression/DX:  Susan Horton is a 51 year old right-handed female who was referred for neuropsychological consultation due to coping and adjustment issues and recovering from encephalopathic status after developing encephalopathy due to her medical status with sepsis.  Patient has a past medical history including end-stage renal disease, CAD, hypertension, congestive heart failure, diabetes, peripheral neuropathy, lower  extremity DVT, asthma, and peripheral vascular disease.  Patient underwent recent excision of tumoral calcinosis left hip/thigh, drain and sutures were removed on 10/20/2021.  Patient presented on 11/08/2021 with progressive weakness and fall over the prior 2 days.  Patient was found to be febrile as well as tachycardic with mild altered mental status.  Patient was admitted to ICU for possible sepsis and placed on antibiotic.  Cranial CT scan was negative for acute process.  She did have some drainage noted from left hip.  Nephrology did continue hemodialysis.  Patient with significant decrease in functional mobility and admitted to CIR.  Patient was awake and oriented but did acknowledge ongoing but improving issues with information processing speed and memory deficits for initial time in the hospital.  Patient is aware of her medical status and able to provide a brief over UVU/review of what is transpired.  Patient was not clear on pertinent issues related to her possible infection and illness.  Patient acknowledges a history where she had adjustment and coping issues roughly 2 years ago but denies any current significant depressive or anxiety type mood state and denies mood disturbance negatively impacting her capacity to participate in therapeutic interventions.  Patient with adequate receptive and expressive language capacity.  Patient evident some difficulties with higher-order executive functioning and problem-solving.  Disposition/Plan:  Today we worked on coping and adjustment issues and reviewed her cognitive status.          Electronically Signed   _______________________ Ilean Skill, Psy.D. Clinical Neuropsychologist

## 2021-11-30 NOTE — Progress Notes (Signed)
Occupational Therapy Session Note  Patient Details  Name: Susan Horton MRN: [id number] Date of Birth: 02/21/1970  Today's Date: 11/30/2021 OT Individual Time: 1000-1100 OT Individual Time Calculation (min): 60 min    Short Term Goals: Week 2:  OT Short Term Goal 1 (Week 2): Pt will complete shower transfer with CGA using LRAD OT Short Term Goal 2 (Week 2): Pt will complete ambulatory toilet transfer with CGA using LRAD OT Short Term Goal 3 (Week 2): Pt will tolerate greater than 5 mins in stance using LRAD during table top activity in prep for ADL needs  Skilled Therapeutic Interventions/Progress Updates:  Pt received seated in [facility] for skilled OT session with focus on ADL retraining and functional transfers. Pt agreeable to interventions with non-quantifiable pain, stating "both of my feet are just tender. . ."   Pt able to perform full-body sponge-bath and dressing at sink-level with retrieval of needed items. Pt tolerates standing greater than 5 minutes to complete set-up of bathing materials and perform peri-hygiene, with Min A + sink edge required to complete STS transfer. While seated in WC, pt threads maternity underwear and scrub pants with Vcs to use figure-4 technique, then bathing/dressing UB with retrieval of top. In standing, pt then dons maternity underwear/pants over bottom with increased time, CGA + sink edge for balance.   OT applied anti-itch and wound healing cream to pt's back and bottom to address skin integrity and pressure wound healing. Furthermore, pt assigned ROHO cushion to ease sacral pressure.   Pt educated on use of TTB , ending session with completion of tub transfer using RW + TTB and supervision.   Pt remained seated on WC with all immediate needs meet at end of session. Pt continues to be appropriate for skilled OT intervention to promote further functional independence.    Therapy Documentation Precautions:  Precautions Precautions: Fall Precaution  Comments: L foot drop, bilateral intermittent hearing loss Restrictions Weight Bearing Restrictions: No    Therapy/Group: Individual Therapy  Maudie Mercury, OTR/L, MSOT   11/30/2021, 7:56 AM

## 2021-11-30 NOTE — Progress Notes (Addendum)
PROGRESS NOTE   Subjective/Complaints: She has no new complaints  She asks whether manuka honey can be applied to all wounds and I have changed order to reflect this  ROS: +pruritus, +foot pain well controlled. +left hip pain 9/10, +diarrhea- improved with imodium, +multiple wounds   Objective:   No results found. No results for input(s): "WBC", "HGB", "HCT", "PLT" in the last 72 hours.  No results for input(s): "NA", "K", "CL", "CO2", "GLUCOSE", "BUN", "CREATININE", "CALCIUM" in the last 72 hours.   Intake/Output Summary (Last 24 hours) at 11/30/2021 1034 Last data filed at 11/30/2021 0809 Gross per 24 hour  Intake 837 ml  Output --  Net 837 ml     Pressure Injury 11/15/21 Sacrum Medial Stage 2 -  Partial thickness loss of dermis presenting as a shallow open injury with a red, pink wound bed without slough. (Active)  11/15/21 1030  Location: Sacrum  Location Orientation: Medial  Staging: Stage 2 -  Partial thickness loss of dermis presenting as a shallow open injury with a red, pink wound bed without slough.  Wound Description (Comments):   Present on Admission: Yes    Physical Exam: Vital Signs Blood pressure (!) 123/51, pulse 75, temperature 98.2 F (36.8 C), temperature source Oral, resp. rate 16, height '5\' 5"'$  (1.651 m), weight 65 kg, SpO2 98 %. Gen: no distress, normal appearing, BMI 22.71 HEENT: oral mucosa pink and moist, NCAT Cardio: Reg rate Chest: normal effort, normal rate of breathing Abd: soft, non-distended Ext: no edema Psych: pleasant, normal affect Musculoskeletal:     Cervical back: Normal range of motion.     Comments: Left hip tender with ROM, appears swollen. Large dressing in place. Left foot hypersensitive to touch.  Skin:    Comments:  Left hip Xeroform dressing in place.  2 incisions left posterior lateral thigh without odor but with yellowish/tan drainage with clumpy, white debris. No  odor. Wounds themselves appear pink without necrotic tissue.  Left foot: tip of great toe and 2nd/third toes appear ischemic. Skin on dorsum of left foot partially peeled due to lidocaine patch. AVF in LUE  Stage 2 to sacrum Neurological:     Mental Status: She is alert.     Comments:    Alert and oriented x 3. Normal insight and awareness. Intact Memory. Normal language and speech. Cranial nerve exam unremarkable. UE grossly 5/5. RLE 3+ to 4/5 prox to 4+/5 distally. LLE 2/5 prox to trace/5 ankle. Stocking glove sensory loss bilateral LE, L>R up to mid calf.   Dressing with MinA Psychiatric:        Mood and Affect: Mood normal.        Behavior: Behavior normal.    Assessment/Plan: 1. Functional deficits which require 3+ hours per day of interdisciplinary therapy in a comprehensive inpatient rehab setting. Physiatrist is providing close team supervision and 24 hour management of active medical problems listed below. Physiatrist and rehab team continue to assess barriers to discharge/monitor patient progress toward functional and medical goals  Care Tool:  Bathing    Body parts bathed by patient: Right arm, Left arm, Chest, Abdomen, Front perineal area, Right upper leg, Left upper leg, Right  lower leg, Left lower leg, Face   Body parts bathed by helper: Buttocks     Bathing assist Assist Level: Contact Guard/Touching assist Assistive Device Comment: stand pivot, shower chair   Upper Body Dressing/Undressing Upper body dressing   What is the patient wearing?: Pull over shirt    Upper body assist Assist Level: Set up assist Assistive Device Comment: Seated on WC  Lower Body Dressing/Undressing Lower body dressing      What is the patient wearing?: Pants, Underwear/pull up     Lower body assist Assist for lower body dressing: Moderate Assistance - Patient 50 - 74%     Toileting Toileting    Toileting assist Assist for toileting: Maximal Assistance - Patient 25 - 49%  (Steady)     Transfers Chair/bed transfer  Transfers assist     Chair/bed transfer assist level: Contact Guard/Touching assist     Locomotion Ambulation   Ambulation assist   Ambulation activity did not occur: Safety/medical concerns (pain, fatigue, weakness/deconditioning, decreased balance)  Assist level: Contact Guard/Touching assist Assistive device: Walker-rolling Max distance: 63f   Walk 10 feet activity   Assist  Walk 10 feet activity did not occur: Safety/medical concerns (pain, fatigue, weakness/deconditioning, decreased balance)  Assist level: Contact Guard/Touching assist Assistive device: Walker-rolling   Walk 50 feet activity   Assist Walk 50 feet with 2 turns activity did not occur: Safety/medical concerns (pain, fatigue, weakness/deconditioning, decreased balance)         Walk 150 feet activity   Assist Walk 150 feet activity did not occur: Safety/medical concerns (pain, fatigue, weakness/deconditioning, decreased balance)         Walk 10 feet on uneven surface  activity   Assist Walk 10 feet on uneven surfaces activity did not occur: Safety/medical concerns (pain, fatigue, weakness/deconditioning, decreased balance)         Wheelchair     Assist Is the patient using a wheelchair?: Yes Type of Wheelchair: Manual    Wheelchair assist level: Supervision/Verbal cueing Max wheelchair distance: 1564f   Wheelchair 50 feet with 2 turns activity    Assist        Assist Level: Supervision/Verbal cueing   Wheelchair 150 feet activity     Assist      Assist Level: Supervision/Verbal cueing   Blood pressure (!) 123/51, pulse 75, temperature 98.2 F (36.8 C), temperature source Oral, resp. rate 16, height '5\' 5"'$  (1.651 m), weight 65 kg, SpO2 98 %.    Medical Problem List and Plan: 1. Functional deficits secondary to septic shock/metabolic with debility and encephalopathy. Pt appears cognitively to be at  baseline. -  Antibiotic therapy 10/29 - 11/6             -patient may shower if left hip is covered             -ELOS/Goals: 10-12 days, mod I to supervision goals  Continue CIR 2.  Impaired mobility: working on sit to stands. Provided accomodation letter for ramp and bathroom. -DVT/anticoagulation:  Pharmaceutical: continue Heparin given bleeding risk             -antiplatelet therapy: Aspirin 81 mg daily and Plavix 75 mg daily 3. Left foot pain: changed Lidoderm patch to bottom of right foot, oxycodone as needed. Provided list of foods for pain relief. Manuka honey ordered for wound healing.  4. Mood/Behavior/Sleep: Provide emotional support             -antipsychotic agents: N/A 5. Neuropsych/cognition: This patient is  capable of making decisions on her own behalf. 6. Left hip incision: Continue BID to TID dressing changes to left hip. Discussed with patient that surgeon stated continued drainage is to be expected. Apply manuka honey.  -WOC follow-up for left hip drainage. -completed course of broad spectrum abx on 11/6   -Assistance provided by orthopedics. 7. Fluids/Electrolytes/Nutrition: Routine in and outs with follow-up chemistries 8.  Peripheral vascular disease.  Follow-up vascular surgery for occluded stents status post right common iliac left common iliac stenting with balloon angioplasty left common femoral and stent left SFA 11/16/2021 per Dr. [name] 9.  Tumoral calcinosis.  Recent excision of tumoral calcinosis left hip thigh [facility] 10/20/2021.  Weightbearing as tolerated. Communicated MRI results to ortho 10.  End-stage renal disease.  Continue hemodialysis as directed. Discussed improved Creatinine to 3.08 on 11/14.  11.  Acute on chronic anemia.  Continue iron supplement 12.  Leukocytosis.  (20k)  -Broad spectrum antibiotic therapy completed 11/16/2021 and monitor.  Latest blood culture showed no growth             -appearance of drainage idiscussed with ortho and  is to be expected. Recent CT (10/30) demonstrating changes c/w tumoral calcinosis and mild cellulitis.              -MRI hip obtianed and stable              -ischemic toes LLE could also be a source             -continue wound care as above             -pt feels well otherwise and no other signs of infection             -consider H/O consult if persistent  -monitor WBC weekly 13.  Asthma without exacerbation as well as remote history of tobacco abuse.  Continue inhalers as directed check oxygen saturations every shift 14. Diet controlled Diabetes mellitus. Blood sugar checks discontinued 15.  Hypertension.  Toprol-XL 25 mg daily.  Monitor with increased mobility 16.  Abrupt onset bilateral sensorineural hearing loss.  Confirmed by audiology 11/7 the symptoms have resolved.  Follow-up outpatient. 17.  Diastolic and systolic congestive heart failure.  Monitor for any signs of fluid overload. Daily weights ordered 18. Pruritus: sarna lotion ordered.  19. Diarrhea: continue imodium 20. Insomnia: secondary to diarrhea, continue imodium.  21. Dysuria: UA/UC ordered  LOS: 7 days A FACE TO FACE EVALUATION WAS PERFORMED  Clide Deutscher Tavon Magnussen 11/30/2021, 10:34 AM

## 2021-11-30 NOTE — Progress Notes (Signed)
Patient continues to refuse heparin d/t "having knots all over" PA/MD notified

## 2021-11-30 NOTE — Progress Notes (Signed)
Received patient in bed to unit.  Alert and oriented.  Informed consent signed and in chart.   Treatment initiated:1650 Treatment completed: 1828  Patient had 1 episode of n/v. Given Zofran Medication was effective. Pt opted to end tx early d/t cramping in legs.  Transported back to the room  Alert, without acute distress.  Hand-off given to patient's nurse.   Access used: AVF Access issues: none  Total UF removed: 216m  Medication(s) given: Zofran Post HD VS: 97.8,103/61,92,19,94% Post HD weight: 63.0kg   [facility]

## 2021-11-30 NOTE — Progress Notes (Signed)
Physical Therapy Session Note  Patient Details  Name: Susan Horton MRN: [id number] Date of Birth: [birth year]  Today's Date: 11/30/2021 PT Individual Time: 7672-0947 and 1100-1153 PT Individual Time Calculation (min): 70 min and 53 min PT Missed Time: 7 minutes PT Missed Time Reason: toileting  Short Term Goals: Week 1:  PT Short Term Goal 1 (Week 1): pt will transfer sit<>stand with LRAD and mod A consistantly PT Short Term Goal 2 (Week 1): pt will transfer bed<>chair with LRAD and min A PT Short Term Goal 3 (Week 1): pt will ambulate 19f with LRAD and mod A  Skilled Therapeutic Interventions/Progress Updates:   Treatment Session 1 Received pt sitting in recliner finishing breakfast. Pt agreeable to PT treatment and reported pain 7-8/10 in L foot - requesting to wait until later for pain medication. Session with emphasis on functional mobility/transfers, generalized strengthening and endurance, and gait training. Pt required x 2 attempts to stand from recliner with RW and max A due to low surface and transferred into WC with RW and CGA. Pt's hip wound began leaking through dressing onto floor - notified RN and stood with RW and mod A from WC to doff pants. RN present to re-dress wound and provided pt with clean scrub pants. Donned clean scrub pants and socks in sitting with max A for time management purposes and [facility] and light mod A to pull pants over hips. RN then administered medication while therapist applied anti-itch cream to pt's low back.   In hallway, pt stood with RW and light mod A x 2 trials and ambulated 357fx 1 and 3951f 1 with RW and CGA fading to close supervision with close WC follow. Pt ambulates with decreased cadence, heavy reliance on BUE support, and decreased weight shifting onto L foot - cues to relax shoulders. Returned to room and pt performed LAQ 2x10 with emphasis on LE strength/ROM. Concluded session with pt sitting in WC, needs within reach, and  seatbelt alarm on awaiting Neuropsych visit.   Treatment Session 2 Received pt sitting in WC, pt agreeable to PT treatment, and reported pain 8.5-9/10 in L foot - requesting to wait until after therapy for pain medication. Session with emphasis on functional mobility/transfers, generalized strengthening and endurance, dynamic standing balance/coordination, and gait training. Pt performed WC mobility [id number] BUE and supervision with 1 rest break halfway through due to fatigue - emphasis on UE strength. Pt transported reminder of way to dayroom in WC [facility] and stood with RW and min A and ambulated 17ft44f trials with RW and close supervision to/from Nustep. Pt performed seated BUE/LE strengthening on Nustep at workload 3 for 6 minutes for a total of 84 steps with emphasis on cardiovascular endurance and weight bearing through LLE. Pt with questions regarding scheduling family education - CSW notified via secure chat. Pt stood from mat with RW and CGA x 2 trials and performed standing alternating toe taps to 3in step (unable to perform with cone) 2x10 reps to fatigue with emphasis on weight shifting to L and hip flexor strength. Pt transferred mat<>WC stand<>pivot with RW and CGA and reported urge to have BM. Transported back to room in WC [facility]ently and transferred WC<>toilet with bedside commode over top with RW and min A. Pt able to manage clothing without assist and left sitting safely on commode with RN notified and planning to attend to care. 7 minutes missed of skilled physical therapy due to toileting.  Therapy Documentation Precautions:  Precautions Precautions: Fall Precaution Comments: L foot drop, bilateral intermittent hearing loss Restrictions Weight Bearing Restrictions: No  Therapy/Group: Individual Therapy Alfonse Alpers PT, DPT  11/30/2021, 6:55 AM

## 2021-11-30 NOTE — Progress Notes (Signed)
[facility] Progress Note   Subjective:   Seen on HD - 2L UFG and tolerating. Says therapy is going well, has been walking. Objective Vitals:   11/29/21 2101 11/30/21 0549 11/30/21 1344 11/30/21 1531  BP: (!) 112/38 (!) 123/51 (!) 127/49   Pulse: 75 75 85   Resp: '17 16 17   '$ Temp: 98.6 F (37 C) 98.2 F (36.8 C) 97.9 F (36.6 C)   TempSrc: Oral Oral Oral   SpO2:  98% 100%   Weight:    63.2 kg  Height:       Physical Exam General:WDWN female in NAD Heart:RRR, no mrg Lungs:CTAB, nml WOB on RA Abdomen:soft, NTND Extremities:trace pedal edema Dialysis Access: LU AVF +b/t   Additional Objective Labs: Basic Metabolic Panel: Recent Labs  Lab 11/24/21 0034  NA 137  K 4.2  CL 94*  CO2 25  GLUCOSE 120*  BUN 19  CREATININE 3.08*  CALCIUM 9.1  PHOS 4.2   Liver Function Tests: Recent Labs  Lab 11/24/21 0034  ALBUMIN 1.8*   CBC: Recent Labs  Lab 11/24/21 0034 11/25/21 0645  WBC 22.2* 17.3*  NEUTROABS  --  14.2*  HGB 7.3* 7.8*  HCT 25.9* 26.7*  MCV 83.8 81.4  PLT 508* 533*   Blood Culture    Component Value Date/Time   SDES WOUND HIP 11/12/2021 1425   SPECREQUEST  LEFT 11/12/2021 1425   CULT  11/12/2021 1425    NO GROWTH 2 DAYS Performed at [facility], [place] [address] [place], [place] [address]    REPTSTATUS 11/14/2021 FINAL 11/12/2021 1425   Medications:  fluconazole (DIFLUCAN) IV      aspirin  81 mg Oral Daily   atorvastatin  40 mg Oral Daily   Chlorhexidine Gluconate Cloth  6 each Topical Q0600   clopidogrel  75 mg Oral Q breakfast   darbepoetin (ARANESP) injection - NON-DIALYSIS  200 mcg Subcutaneous Q Wed-1800   doxercalciferol  7 mcg Intravenous Q M,W,F-HD   [START ON 12/01/2021] fluconazole  100 mg Oral Daily   heparin  5,000 Units Subcutaneous Q8H   heparin sodium (porcine)       leptospermum manuka honey  1 Application Topical Daily   lidocaine  1 patch Transdermal Q24H   metoCLOPramide  10 mg Oral TID AC    metoprolol succinate  12.5 mg Oral Daily   montelukast  10 mg Oral QHS   pantoprazole  40 mg Oral Daily   pneumococcal 20-valent conjugate vaccine  0.5 mL Intramuscular Tomorrow-1000   thiamine  100 mg Oral Daily    Dialysis Orders: [facility]WF [facility] 3h 42mn 400/600  61kg  2/2 bath P2  Hep 4000  LUE AVF - mircera 200 mcg IV q2, last 10/16, due 10/30 - venofer '100mg'$  IV tiw thru 11/06 - doxercalciferol 7 ug tiw - sod thiosulfate 25 gm iv tiw  Assessment/Plan: Septic shock - sp course of Zosyn, now resolved > CIR d/t debility  ESRD: Required CRRT on admit, now back to iHD MWF. Plan to continue regular MWF schedule so not to disrupt rehab schedule.  Will not run on Holiday schedule.  HD today - 2L UF goal. HTN/volume: BP controlled, mostly euvolemic on exam.  Anemia of ESRD: Hgb 7s. Continue max dose Aranesp 2086m weekly while here. No IV Fe with ferritin >5000 Secondary HPTH: CorrCa ok, last phos in goal. She has trouble tolerating any binder secondary to gastroparesis-we will hold binder for now and follow trend - was on  Fosrenol, no iron binders with ferritin 5000 Nutrition: Alb low,  continue supplements sometimes refusing Nepro Encephalopathy - D/t shock/meds?, better. Had some hearing loss and foot drop 11/7 now spontaneously improving. No offending meds Myoclonus: severe, after initial consult. Felt due to meds (cefepime, gabapentin and narcotics). Resolved.  Tumoral calcinosis L hip - long-standing painful issue for her, on NaThio empirically as op, although hasn't helped. S/p "excision" with [facility] on 10/09/21. Recultured 11/2 - NGTD. T2DM with severe gastroparesis - Ongoing issue, s/p esophageal myotomy 09/17/21. Has issues swallow/keeping pills down. L foot pain - Xray negative, uric acid normal. VVS consulted, s/p LE angiography with improvement in pain Leukocytosis - improving. Last 17.3. Per PMD. Dispo: Tentative d/c plan of 12/12/21  Veneta Penton, PA-C 11/30/2021, 3:57 PM   [facility]

## 2021-12-01 DIAGNOSIS — I10 Essential (primary) hypertension: Secondary | ICD-10-CM | POA: Diagnosis not present

## 2021-12-01 DIAGNOSIS — A419 Sepsis, unspecified organism: Secondary | ICD-10-CM | POA: Diagnosis not present

## 2021-12-01 DIAGNOSIS — D72829 Elevated white blood cell count, unspecified: Secondary | ICD-10-CM | POA: Diagnosis not present

## 2021-12-01 DIAGNOSIS — I5042 Chronic combined systolic (congestive) and diastolic (congestive) heart failure: Secondary | ICD-10-CM | POA: Diagnosis not present

## 2021-12-01 LAB — HEPATITIS B SURFACE ANTIBODY, QUANTITATIVE: Hep B S AB Quant (Post): 41.4 m[IU]/mL (ref >9.9)

## 2021-12-01 MED ORDER — LANTHANUM CARBONATE 500 MG PO CHEW
1000.0000 mg | CHEWABLE_TABLET | Freq: Every day | ORAL | Status: DC
  Administered 2021-12-01 – 2021-12-12 (×2): 1000 mg via ORAL
  Filled 2021-12-01 (×14): qty 2

## 2021-12-01 NOTE — Progress Notes (Signed)
Physical Therapy Session Note  Patient Details  Name: Susan Horton MRN: [id number] Date of Birth: 1970-06-10  Today's Date: 12/01/2021 PT Individual Time: 1302-1331 PT Individual Time Calculation (min): 29 min   Short Term Goals: Week 1:  PT Short Term Goal 1 (Week 1): pt will transfer sit<>stand with LRAD and mod A consistantly PT Short Term Goal 2 (Week 1): pt will transfer bed<>chair with LRAD and min A PT Short Term Goal 3 (Week 1): pt will ambulate 50f with LRAD and mod A   Skilled Therapeutic Interventions/Progress Updates:  Patient seated upright in w/c on entrance to room. Patient alert and agreeable to PT session.   Patient with minimal pain complaint at start of session at sacrum and L hip. Reduces with mobility and ambulation during session.   Therapeutic Activity: Transfers: Pt performed sit<>stand and stand pivot transfers throughout session with close supervision. Provided verbal cues for improved initial positioning of feet. Toilet transfer performed with supervision and RW. Pericare with Mod I.   Gait Training:  Using doorway in hallway for visual goal marker, Pt ambulated 55 ft using RW and close supervision with w/c follow for pain/ fatigue.  Demonstrated improved upright posture, reduced shoulder elevation despite continued reliance on RW for UE support. Very slow pace with one brief standing rest break midway through distance. Provided vc/ tc for maintaining level gaze, upright posture, increasing hip/ knee flexion with LLE for improved foot clearance.  Patient seated in w/c at end of session with brakes locked, chair pad alarm set, and all needs within reach.   Therapy Documentation Precautions:  Precautions Precautions: Fall Precaution Comments: L foot drop, bilateral intermittent hearing loss Restrictions Weight Bearing Restrictions: No General:   Vital Signs: Therapy Vitals Temp: (!) 97.4 F (36.3 C) Temp Source: Oral Pulse Rate: 82 Resp: 17 BP:  (!) 121/52 Patient Position (if appropriate): Sitting Oxygen Therapy SpO2: 100 % O2 Device: Room Air Pain:  Mild pain complaint at L hip during mobility. Addressed with repositioning.   Therapy/Group: Individual Therapy  JAlger SimonsPT, DPT, CSRS 12/01/2021, 5:04 PM

## 2021-12-01 NOTE — Plan of Care (Signed)
  Problem: RH Balance Goal: LTG Patient will maintain dynamic standing with ADLs (OT) Description: LTG:  Patient will maintain dynamic standing balance with assist during activities of daily living (OT)  Flowsheets (Taken 12/01/2021 1210) LTG: Pt will maintain dynamic standing balance during ADLs with: (upgraded due to progression with balance, endurance and LLE WB tolerance) Independent with assistive device   Problem: Sit to Stand Goal: LTG:  Patient will perform sit to stand in prep for activites of daily living with assistance level (OT) Description: LTG:  Patient will perform sit to stand in prep for activites of daily living with assistance level (OT) Flowsheets (Taken 12/01/2021 1210) LTG: PT will perform sit to stand in prep for activites of daily living with assistance level: (upgraded due to progression with balance, endurance and LLE WB tolerance) Independent with assistive device   Problem: RH Grooming Goal: LTG Patient will perform grooming w/assist,cues/equip (OT) Description: LTG: Patient will perform grooming with assist, with/without cues using equipment (OT) Flowsheets (Taken 12/01/2021 1210) LTG: Pt will perform grooming with assistance level of: (upgraded due to progression with balance, endurance and LLE WB tolerance) Independent with assistive device    Problem: RH Bathing Goal: LTG Patient will bathe all body parts with assist levels (OT) Description: LTG: Patient will bathe all body parts with assist levels (OT) Flowsheets (Taken 12/01/2021 1210) LTG: Pt will perform bathing with assistance level/cueing: (upgraded due to progression with balance, endurance and LLE WB tolerance) Independent with assistive device    Problem: RH Dressing Goal: LTG Patient will perform lower body dressing w/assist (OT) Description: LTG: Patient will perform lower body dressing with assist, with/without cues in positioning using equipment (OT) Flowsheets (Taken 12/01/2021 1210) LTG: Pt  will perform lower body dressing with assistance level of: (upgraded due to progression with balance, endurance and LLE WB tolerance) Independent with assistive device   Problem: RH Toileting Goal: LTG Patient will perform toileting task (3/3 steps) with assistance level (OT) Description: LTG: Patient will perform toileting task (3/3 steps) with assistance level (OT)  Flowsheets (Taken 12/01/2021 1210) LTG: Pt will perform toileting task (3/3 steps) with assistance level: (upgraded due to progression with balance, endurance and LLE WB tolerance) Independent with assistive device   Problem: RH Toilet Transfers Goal: LTG Patient will perform toilet transfers w/assist (OT) Description: LTG: Patient will perform toilet transfers with assist, with/without cues using equipment (OT) Flowsheets (Taken 12/01/2021 1210) LTG: Pt will perform toilet transfers with assistance level of: (upgraded due to progression with balance, endurance and LLE WB tolerance) Independent with assistive device

## 2021-12-01 NOTE — Progress Notes (Addendum)
[facility] Progress Note   Subjective:  Seen in room - had a rough time on HD after I saw her yesterday - reports vomiting and severe cramping and had to end HD early due to it, net UF only 245m. No CP/dyspnea.  Objective Vitals:   11/30/21 1805 11/30/21 1828 11/30/21 1947 12/01/21 0549  BP: (!) 96/42 103/61 (!) 99/37 (!) 115/44  Pulse: (!) 113 78 72 87  Resp: '18 19 18 20  '$ Temp:  97.8 F (36.6 C) 99 F (37.2 C) 99 F (37.2 C)  TempSrc:  Oral Oral Oral  SpO2: 94% 92%  100%  Weight:    64.6 kg  Height:       Physical Exam General: Well appearing woman, NAD. Sitting in wheelchair Heart: RRR; no murmur Lungs: CTAB; no rales Abdomen: soft Extremities: trace LE edema Dialysis Access: LUE AVF + bruit  Additional Objective Labs: Basic Metabolic Panel: Recent Labs  Lab 11/30/21 1546  NA 138  K 3.7  CL 96*  CO2 23  GLUCOSE 88  BUN 57*  CREATININE 6.48*  CALCIUM 8.3*  PHOS 6.0*   Liver Function Tests: Recent Labs  Lab 11/30/21 1546  ALBUMIN 1.9*   CBC: Recent Labs  Lab 11/25/21 0645 11/30/21 1546  WBC 17.3* 21.8*  NEUTROABS 14.2*  --   HGB 7.8* 7.5*  HCT 26.7* 26.6*  MCV 81.4 83.1  PLT 533* 499*   Medications:  fluconazole (DIFLUCAN) IV      aspirin  81 mg Oral Daily   atorvastatin  40 mg Oral Daily   Chlorhexidine Gluconate Cloth  6 each Topical Q0600   clopidogrel  75 mg Oral Q breakfast   darbepoetin (ARANESP) injection - NON-DIALYSIS  200 mcg Subcutaneous Q Wed-1800   doxercalciferol  7 mcg Intravenous Q M,W,F-HD   fluconazole  100 mg Oral Daily   heparin  5,000 Units Subcutaneous Q8H   leptospermum manuka honey  1 Application Topical Daily   lidocaine  1 patch Transdermal Q24H   metoCLOPramide  10 mg Oral TID AC   metoprolol succinate  12.5 mg Oral Daily   montelukast  10 mg Oral QHS   pantoprazole  40 mg Oral Daily   pneumococcal 20-valent conjugate vaccine  0.5 mL Intramuscular Tomorrow-1000   thiamine  100 mg Oral Daily     Dialysis Orders: MWF [facility] 3h 426m 400/600  61kg  2/2 bath P2  Hep 4000  LUE AVF - mircera 200 mcg IV q2, last 10/16, due 10/30 - venofer '100mg'$  IV tiw thru 11/06 - doxercalciferol 7 ug tiw - sod thiosulfate 25 gm iv tiw   Assessment/Plan: Septic shock - sp course of Zosyn, now resolved -> CIR d/t debility  ESRD: Required CRRT on admit, now back to iHD MWF. Plan to continue regular MWF schedule so not to disrupt rehab schedule.  Will not run on Holiday schedule.  Next HD 11/22. HTN/volume: BP controlled, mostly euvolemic on exam.  Anemia of ESRD: Hgb 7s. Continue max dose Aranesp 20091mweekly while here. No IV Fe with ferritin >5000 Secondary HPTH: CorrCa ok, Phos high. She has trouble tolerating any binder secondary to gastroparesis, will order Fosrenol once daily for now. Nutrition: Alb low,  continue supplements sometimes refusing Nepro Encephalopathy - D/t shock/meds?, better. Had some hearing loss and foot drop 11/7 now spontaneously improving. No offending meds Myoclonus: severe, after initial consult. Felt due to meds (cefepime, gabapentin and narcotics). Resolved.  Tumoral calcinosis L hip - long-standing painful issue for  her, on NaThio empirically as op, although hasn't helped. S/p "excision" with [facility] on 10/09/21. Recultured 11/2 - NGTD. T2DM with severe gastroparesis - Ongoing issue, s/p esophageal myotomy 09/17/21. Has issues swallow/keeping pills down. L foot pain - Xray negative, uric acid normal. VVS consulted, s/p LE angiography with improvement in pain Leukocytosis - improving. Last 17.3. Per PMD. Dispo: Tentative d/c plan of 12/12/21  Veneta Penton, PA-C 12/01/2021, 10:02 AM  [facility]

## 2021-12-01 NOTE — Progress Notes (Signed)
Physical Therapy Session Note  Patient Details  Name: Susan Horton MRN: [id number] Date of Birth: 1970-10-06  Today's Date: 12/01/2021 PT Individual Time: 0805-0915 PT Individual Time Calculation (min): 70 min   Short Term Goals: Week 1:  PT Short Term Goal 1 (Week 1): pt will transfer sit<>stand with LRAD and mod A consistantly PT Short Term Goal 2 (Week 1): pt will transfer bed<>chair with LRAD and min A PT Short Term Goal 3 (Week 1): pt will ambulate 71f with LRAD and mod A   Skilled Therapeutic Interventions/Progress Updates:  Patient seated upright in w/c on entrance to room. Patient alert and agreeable to PT session.   Patient with mild pain complaint at L hip, sacrum, and L foot wounds at start of session. She also c/o nausea that started at 6am and   Therapeutic Activity: Transfers: Pt performed sit<>stand and stand pivot transfers throughout session with supervision/ intermittent CGA for fatigue. Provided verbal cues for***.  Gait Training:  Pt ambulated *** ft using *** with ***. Demonstrated ***. Provided vc/ tc for ***.  Wheelchair Mobility:  Pt propelled wheelchair *** feet with ***. Provided vc for ***.  Neuromuscular Re-ed: NMR facilitated during session with focus on***. Pt guided in ***. NMR performed for improvements in motor control and coordination, balance, sequencing, judgement, and self confidence/ efficacy in performing all aspects of mobility at highest level of independence.   Therapeutic Exercise: Pt performed the following exercises with vc/ tc for proper technique. ***  Patient *** at end of session with brakes locked, *** alarm set, and all needs within reach.   Therapy Documentation Precautions:  Precautions Precautions: Fall Precaution Comments: L foot drop, bilateral intermittent hearing loss Restrictions Weight Bearing Restrictions: No General:   Vital Signs:  Pain: Pain Assessment Pain Scale: 0-10 Pain Score: 3  Pain Type:  Chronic pain Pain Location: Foot Pain Orientation: Left Pain Intervention(s): Refused  Therapy/Group: Individual Therapy  JAlger SimonsPT, DPT, CSRS 12/01/2021, 10:22 AM

## 2021-12-01 NOTE — Progress Notes (Signed)
PROGRESS NOTE   Subjective/Complaints: Reports she had hard time with dialysis yesterday. Has nausea and vomiting after this but it has resolved. No additional concerns.   ROS: +pruritus, +foot pain well controlled. +left hip pain 9/10, +diarrhea- improved with imodium, +multiple wounds. No CP or SOB   Objective:   No results found. Recent Labs    11/30/21 1546  WBC 21.8*  HGB 7.5*  HCT 26.6*  PLT 499*    Recent Labs    11/30/21 1546  NA 138  K 3.7  CL 96*  CO2 23  GLUCOSE 88  BUN 57*  CREATININE 6.48*  CALCIUM 8.3*     Intake/Output Summary (Last 24 hours) at 12/01/2021 1512 Last data filed at 12/01/2021 1421 Gross per 24 hour  Intake 457 ml  Output 800 ml  Net -343 ml      Pressure Injury 11/15/21 Sacrum Medial Stage 2 -  Partial thickness loss of dermis presenting as a shallow open injury with a red, pink wound bed without slough. (Active)  11/15/21 1030  Location: Sacrum  Location Orientation: Medial  Staging: Stage 2 -  Partial thickness loss of dermis presenting as a shallow open injury with a red, pink wound bed without slough.  Wound Description (Comments):   Present on Admission: Yes    Physical Exam: Vital Signs Blood pressure (!) 121/52, pulse 82, temperature (!) 97.4 F (36.3 C), temperature source Oral, resp. rate 17, height '5\' 5"'$  (1.651 m), weight 64.6 kg, SpO2 100 %. Gen: no distress, normal appearing, BMI 22.71 HEENT: oral mucosa pink and moist, NCAT Cardio: Reg rate Chest: normal effort, normal rate of breathing Abd: soft, non-distended Ext: no edema Psych: pleasant, normal affect Musculoskeletal:     Cervical back: Normal range of motion.     Comments: Left hip tender with ROM, appears swollen. Large dressing in place. Left foot hypersensitive to touch.  Skin:    Comments:  Left hip Xeroform dressing in place.  2 incisions left posterior lateral thigh without odor but with  yellowish/tan drainage with clumpy, white debris. No odor. Wounds themselves appear pink without necrotic tissue.  Left foot: tip of great toe and 2nd/third toes appear ischemic. Skin on dorsum of left foot partially peeled due to lidocaine patch. AVF in LUE  Stage 2 to sacrum Neurological:     Mental Status: She is alert.     Comments:    Alert and oriented x 3. Normal insight and awareness. Intact Memory. Normal language and speech. Cranial nerve exam unremarkable. UE grossly 5/5. RLE 3+ to 4/5 prox to 4+/5 distally. LLE 2/5 prox to trace/5 ankle. Stocking glove sensory loss bilateral LE, L>R up to mid calf.   Dressing with MinA Psychiatric:        Mood and Affect: Mood normal.    Flat affect      Behavior: Behavior normal.    Assessment/Plan: 1. Functional deficits which require 3+ hours per day of interdisciplinary therapy in a comprehensive inpatient rehab setting. Physiatrist is providing close team supervision and 24 hour management of active medical problems listed below. Physiatrist and rehab team continue to assess barriers to discharge/monitor patient progress toward functional and  medical goals  Care Tool:  Bathing    Body parts bathed by patient: Right arm, Left arm, Chest, Abdomen, Front perineal area, Right upper leg, Left upper leg, Right lower leg, Left lower leg, Face, Buttocks   Body parts bathed by helper: Buttocks     Bathing assist Assist Level: Contact Guard/Touching assist Assistive Device Comment: stand pivot, shower chair   Upper Body Dressing/Undressing Upper body dressing   What is the patient wearing?: Pull over shirt    Upper body assist Assist Level: Independent with assistive device Assistive Device Comment: Seated on WC  Lower Body Dressing/Undressing Lower body dressing      What is the patient wearing?: Underwear/pull up, Pants     Lower body assist Assist for lower body dressing: Minimal Assistance - Patient > 75%      Toileting Toileting    Toileting assist Assist for toileting: Maximal Assistance - Patient 25 - 49% (Steady)     Transfers Chair/bed transfer  Transfers assist     Chair/bed transfer assist level: Contact Guard/Touching assist     Locomotion Ambulation   Ambulation assist   Ambulation activity did not occur: Safety/medical concerns (pain, fatigue, weakness/deconditioning, decreased balance)  Assist level: Contact Guard/Touching assist Assistive device: Walker-rolling Max distance: 69f   Walk 10 feet activity   Assist  Walk 10 feet activity did not occur: Safety/medical concerns (pain, fatigue, weakness/deconditioning, decreased balance)  Assist level: Contact Guard/Touching assist Assistive device: Walker-rolling   Walk 50 feet activity   Assist Walk 50 feet with 2 turns activity did not occur: Safety/medical concerns (pain, fatigue, weakness/deconditioning, decreased balance)         Walk 150 feet activity   Assist Walk 150 feet activity did not occur: Safety/medical concerns (pain, fatigue, weakness/deconditioning, decreased balance)         Walk 10 feet on uneven surface  activity   Assist Walk 10 feet on uneven surfaces activity did not occur: Safety/medical concerns (pain, fatigue, weakness/deconditioning, decreased balance)         Wheelchair     Assist Is the patient using a wheelchair?: Yes Type of Wheelchair: Manual    Wheelchair assist level: Supervision/Verbal cueing Max wheelchair distance: 1546f   Wheelchair 50 feet with 2 turns activity    Assist        Assist Level: Supervision/Verbal cueing   Wheelchair 150 feet activity     Assist      Assist Level: Supervision/Verbal cueing   Blood pressure (!) 121/52, pulse 82, temperature (!) 97.4 F (36.3 C), temperature source Oral, resp. rate 17, height '5\' 5"'$  (1.651 m), weight 64.6 kg, SpO2 100 %.    Medical Problem List and Plan: 1. Functional deficits  secondary to septic shock/metabolic with debility and encephalopathy. Pt appears cognitively to be at baseline. -  Antibiotic therapy 10/29 - 11/6             -patient may shower if left hip is covered             -ELOS/Goals: 10-12 days, mod I to supervision goals  Continue CIR  -Seen by neuropsychology yesterday working on coping/adjustment 2.  Impaired mobility: working on sit to stands. Provided accomodation letter for ramp and bathroom. -DVT/anticoagulation:  Pharmaceutical: continue Heparin given bleeding risk             -antiplatelet therapy: Aspirin 81 mg daily and Plavix 75 mg daily 3. Left foot pain: changed Lidoderm patch to bottom of right foot, oxycodone  as needed. Provided list of foods for pain relief. Manuka honey ordered for wound healing.  4. Mood/Behavior/Sleep: Provide emotional support             -antipsychotic agents: N/A 5. Neuropsych/cognition: This patient is capable of making decisions on her own behalf. 6. Left hip incision: Continue BID to TID dressing changes to left hip. Discussed with patient that surgeon stated continued drainage is to be expected. Apply manuka honey.  -WOC follow-up for left hip drainage. -completed course of broad spectrum abx on 11/6   -Assistance provided by orthopedics. 7. Fluids/Electrolytes/Nutrition: Routine in and outs with follow-up chemistries 8.  Peripheral vascular disease.  Follow-up vascular surgery for occluded stents status post right common iliac left common iliac stenting with balloon angioplasty left common femoral and stent left SFA 11/16/2021 per Dr. [name] 9.  Tumoral calcinosis.  Recent excision of tumoral calcinosis left hip thigh at [facility] 10/20/2021.  Weightbearing as tolerated. Communicated MRI results to ortho 10.  End-stage renal disease.  Continue hemodialysis as directed. Discussed improved Creatinine to 3.08 on 11/14.   -11/21Noted that she spoke with renal about her concerns with dialysis, HD MWF 11.   Acute on chronic anemia.  Continue iron supplement 12.  Leukocytosis.  (20k)  -Broad spectrum antibiotic therapy completed 11/16/2021 and monitor.  Latest blood culture showed no growth             -appearance of drainage idiscussed with ortho and is to be expected. Recent CT (10/30) demonstrating changes c/w tumoral calcinosis and mild cellulitis.              -MRI hip obtianed and stable              -ischemic toes LLE could also be a source             -continue wound care as above             -pt feels well otherwise and no other signs of infection             -consider H/O consult if persistent  -WBC yesterday was 21.8, continue to monitor 13.  Asthma without exacerbation as well as remote history of tobacco abuse.  Continue inhalers as directed check oxygen saturations every shift 14. Diet controlled Diabetes mellitus. Blood sugar checks discontinued 15.  Hypertension.  Toprol-XL 25 mg daily.  Monitor with increased mobility -11/21 continue toprol 12.5, continue to monitor 16.  Abrupt onset bilateral sensorineural hearing loss.  Confirmed by audiology 11/7 the symptoms have resolved.  Follow-up outpatient. 17.  Diastolic and systolic congestive heart failure.  Monitor for any signs of fluid overload. Daily weights ordered Filed Weights   11/29/21 0540 11/30/21 1531 12/01/21 0549  Weight: 65 kg 63.2 kg 64.6 kg  Weight stable  18. Pruritus: sarna lotion ordered.  19. Diarrhea: continue imodium 20. Insomnia: secondary to diarrhea, continue imodium.  21. Dysuria: UA/UC ordered  LOS: 8 days A FACE TO FACE EVALUATION WAS PERFORMED  Jennye Boroughs 12/01/2021, 3:12 PM

## 2021-12-01 NOTE — Progress Notes (Signed)
Physical Therapy Weekly Progress Note  Patient Details  Name: Susan Horton MRN: [id number] Date of Birth: August 18, 1970  Beginning of progress report period: November 24, 2021 End of progress report period: December 01, 2021  Today's Date: 12/01/2021   Patient has met 3 of 3 short term goals.  Pt making good progress towards goals and is on track to meet LTG. She does have difficulty with nausea and fatigue following dialysis but has been able to participate in therapy despite symptoms. She completes bed mobility with supervision, sit<>stand transfers with supervision, and stand pivot transfers with supervision/ CGA and RW. She's ambulating between 20-37f consistently with furthest distance at 597fusing RW with close supervision/ CGA. No stair negotiation as of yet, but will initiate this week. She continues to be primarily limited by pain, general weakness, LLE weakness, decreased standing balance, and gait impairments. No family education completed yet, but is scheduled prior to d/c.    Patient continues to demonstrate the following deficits muscle weakness, decreased cardiorespiratoy endurance, impaired timing and sequencing and unbalanced muscle activation, and decreased standing balance, decreased postural control, and decreased balance strategies and therefore will continue to benefit from skilled PT intervention to increase functional independence with mobility.  Patient progressing toward long term goals..  Continue plan of care.  PT Short Term Goals Week 1:  PT Short Term Goal 1 (Week 1): pt will transfer sit<>stand with LRAD and mod A consistantly PT Short Term Goal 1 - Progress (Week 1): Met PT Short Term Goal 2 (Week 1): pt will transfer bed<>chair with LRAD and min A PT Short Term Goal 2 - Progress (Week 1): Met PT Short Term Goal 3 (Week 1): pt will ambulate 52f56fith LRAD and mod A PT Short Term Goal 3 - Progress (Week 1): Met Week 2:  PT Short Term Goal 1 (Week 2): pt will  transfer sit<>stand with LRAD and consistant SUP PT Short Term Goal 2 (Week 2): pt will transfer bed<>chair with LRAD and consistant SUP PT Short Term Goal 3 (Week 2): pt will ambulate [id number] LRAD and SUP PT Short Term Goal 4 (Week 2): Pt will initiate stair training.  Skilled Therapeutic Interventions/Progress Updates:  Ambulation/gait training;Discharge planning;Functional mobility training;Psychosocial support;Therapeutic Activities;Visual/perceptual remediation/compensation;Balance/vestibular training;Disease management/prevention;Neuromuscular re-education;Skin care/wound management;Therapeutic Exercise;Wheelchair propulsion/positioning;DME/adaptive equipment instruction;Cognitive remediation/compensation;Pain management;Splinting/orthotics;UE/LE Strength taining/ROM;Community reintegration;Functional electrical stimulation;Patient/family education;Stair training;UE/LE Coordination activities   Therapy Documentation Precautions:  Precautions Precautions: Fall Precaution Comments: L foot drop, bilateral intermittent hearing loss Restrictions Weight Bearing Restrictions: No General:   Vital Signs: Therapy Vitals Temp: (!) 97.4 F (36.3 C) Temp Source: Oral Pulse Rate: 82 Resp: 17 BP: (!) 121/52 Patient Position (if appropriate): Sitting Oxygen Therapy SpO2: 100 % O2 Device: Room Air Pain: Pain Assessment Pain Scale: 0-10 Pain Score: 3  Pain Type: Surgical pain Pain Location: Hip Pain Orientation: Left Pain Descriptors / Indicators: Discomfort;Aching;Tightness Pain Onset: On-going Vision/Perception  Vision - History Ability to See in Adequate Light: 0 Adequate Perception Perception: Within Functional Limits Praxis Praxis: Intact  Mobility: Bed Mobility Bed Mobility: Sit to Supine;Supine to Sit Rolling Right: Supervision/verbal cueing Rolling Left: Supervision/Verbal cueing Supine to Sit: Supervision/Verbal cueing Sit to Supine: Supervision/Verbal  cueing Transfers Transfers: Sit to Stand;Stand to Sit;Squat Pivot Transfers Sit to Stand: Supervision/Verbal cueing Stand to Sit: Supervision/Verbal cueing Stand Pivot Transfers: Supervision/Verbal cueing Transfer (Assistive device): Rolling walker Locomotion : Gait Ambulation: Yes Gait Assistance: Supervision/Verbal cueing;Contact Guard/Touching assist Gait Distance (Feet): 55 Feet Assistive device: Rolling walker Gait Assistance Details: Verbal cues  for safe use of DME/AE;Verbal cues for precautions/safety;Verbal cues for technique Gait Gait: Yes Gait Pattern: Left steppage;Antalgic;Poor foot clearance - left Gait velocity: reduced Stairs / Additional Locomotion Stairs: No Architect: Yes Wheelchair Assistance: Chartered loss adjuster: Both upper extremities Wheelchair Parts Management: Needs assistance  Trunk/Postural Assessment : Cervical Assessment Cervical Assessment: Exceptions to [facility] (Forward head) Thoracic Assessment Thoracic Assessment: Exceptions to [facility] (Mild kyphosis) Lumbar Assessment Lumbar Assessment: Exceptions to [facility] (anterior pelvic tilt in standing) Postural Control Postural Control: Deficits on evaluation Righting Reactions: delayed  Balance: Balance Balance Assessed: Yes Static Sitting Balance Static Sitting - Level of Assistance: 6: Modified independent (Device/Increase time) Dynamic Sitting Balance Dynamic Sitting - Level of Assistance: 6: Modified independent (Device/Increase time) Static Standing Balance Static Standing - Balance Support: During functional activity;Bilateral upper extremity supported Static Standing - Level of Assistance: 5: Stand by assistance Dynamic Standing Balance Dynamic Standing - Level of Assistance: 5: Stand by assistance   Therapy/Group: Individual Therapy  Alger Simons PT, DPT, CSRS 12/01/2021, 5:13 PM

## 2021-12-01 NOTE — Progress Notes (Signed)
Occupational Therapy Session Note  Patient Details  Name: Susan Horton MRN: [id number] Date of Birth: 01-07-1971  Today's Date: 12/01/2021 OT Individual Time: 1103-1200 & 1446-1526 OT Individual Time Calculation (min): 57 min & 40 min   Short Term Goals: Week 2:  OT Short Term Goal 1 (Week 2): Pt will complete shower transfer with CGA using LRAD OT Short Term Goal 2 (Week 2): Pt will complete ambulatory toilet transfer with CGA using LRAD OT Short Term Goal 3 (Week 2): Pt will tolerate greater than 5 mins in stance using LRAD during table top activity in prep for ADL needs  Skilled Therapeutic Interventions/Progress Updates:  Session 1 Skilled OT intervention completed with focus on functional endurance and ADL retraining within a shower context. Pt received seated in w/c, agreeable to session. 8/10 pain reported in LLE but declined meds due to fatigue side effects. Offered rest breaks and repositioning throughout for pain reduction.  Completed supervision sit > stand using RW, then supervision ambulatory transfer with RW to shower chair with supervision needed for side stepping into shower with use of grab bars. Waterproof cover applied to L hip over wound dressing. Completed bathing with set up A for UB, CGA for sit > stand with education on where to grab on grab bar for powering up, Supervision LB for peri-areas. Did need min A to stand with grab bar in prep for exiting shower due to fatigue, however discussed in her home she will pivot out and bench will be higher at home with TTB in tub shower. CGA stand pivot to w/c.  Completed UB dressing with mod I, threaded underwear with supervision, then min A sit > stand using counter for UE support. Able to maintain stance with supervision during application of medihoney on sacral pressure sore and new sacral dressing. CGA donning of underwear over hips. Donned RLE sock for time. Min A sit > stand using RW and CGA stand pivot to EOB, with pt  remaining seated EOB for wound dressing change. Nurse made aware of request, bed alarm on/activated, and with all needs in reach at end of session.  Session 2 Skilled OT intervention completed with focus on w/c mobility in prep for community mobility, ambulatory endurance. Pt received seated in w/c, agreeable to session. No pain reported.  Transported dependently in w/c > gift shop for time. Pt self-propelled throughout gift shop to pick out desired items, with no cues needed for w/c navigation though would benefit from navigation with smaller spaces. Was able to select and gather correct amount of cash to purchase 2 items and managed the exchange at the counter with supervision.   Self-propelled to elevators, then therapist took over due to fatigue.  Supervision sit > stand using RW, then ambulated using RW with CGA for 73f, with 1 standing rest break then another 12 ft prior to seated rest. Pt indicated she feels like the is able to put about 80% weight on her LLE.  Transported back to room, where pt remained seated in w/c, with chair alarm on/activated, and with all needs in reach at end of session.   Therapy Documentation Precautions:  Precautions Precautions: Fall Precaution Comments: L foot drop, bilateral intermittent hearing loss Restrictions Weight Bearing Restrictions: No    Therapy/Group: Individual Therapy  HBlase Mess MS, OTR/L  12/01/2021, 3:36 PM

## 2021-12-02 DIAGNOSIS — K219 Gastro-esophageal reflux disease without esophagitis: Secondary | ICD-10-CM

## 2021-12-02 DIAGNOSIS — I5042 Chronic combined systolic (congestive) and diastolic (congestive) heart failure: Secondary | ICD-10-CM | POA: Diagnosis not present

## 2021-12-02 DIAGNOSIS — A419 Sepsis, unspecified organism: Secondary | ICD-10-CM | POA: Diagnosis not present

## 2021-12-02 DIAGNOSIS — R112 Nausea with vomiting, unspecified: Secondary | ICD-10-CM

## 2021-12-02 DIAGNOSIS — N186 End stage renal disease: Secondary | ICD-10-CM | POA: Diagnosis not present

## 2021-12-02 DIAGNOSIS — I10 Essential (primary) hypertension: Secondary | ICD-10-CM | POA: Diagnosis not present

## 2021-12-02 MED ORDER — PANTOPRAZOLE SODIUM 40 MG PO TBEC
40.0000 mg | DELAYED_RELEASE_TABLET | Freq: Two times a day (BID) | ORAL | Status: DC
  Administered 2021-12-02 – 2021-12-14 (×25): 40 mg via ORAL
  Filled 2021-12-02 (×26): qty 1

## 2021-12-02 NOTE — Progress Notes (Signed)
Occupational Therapy Session Note  Patient Details  Name: Susan Horton MRN: [id number] Date of Birth: 24-Jan-1970  Today's Date: 12/02/2021 OT Individual Time: 2025-4270 OT Individual Time Calculation (min): 60 min    Short Term Goals: Week 2:  OT Short Term Goal 1 (Week 2): Pt will complete shower transfer with CGA using LRAD OT Short Term Goal 2 (Week 2): Pt will complete ambulatory toilet transfer with CGA using LRAD OT Short Term Goal 3 (Week 2): Pt will tolerate greater than 5 mins in stance using LRAD during table top activity in prep for ADL needs  Skilled Therapeutic Interventions/Progress Updates:    Patient agreeable to participate in OT session. Reports 10/10 pain level in bilateral legs; described as aching, sore, throbbing, constant. Declined requesting for pain meds as it'll make her fatigued and sleepy. Reports being nauseous this AM. Received Zofran during OT session.    Patient participated in skilled OT session focusing on BUE strengthening utilizing red t-band. Therapist facilitated UE strengthening while pt sat at EOB eliciting core strength and stability in order to improve strength and endurance needed to complete functional transfers and static standing tasks requiring core stability and strength to maintain balance.  SBA provided for all bed mobility.   Strengthening (seated EOB): - Shoulder, flexion, horizontal abduction/adduction, IR/er, scaption, abduction/adduction, 15X, 1 set - Elbow, extension, flexion, 15X, 1 set - Scapular, row, extension, 15X, 1 set   Therapy Documentation Precautions:  Precautions Precautions: Fall Precaution Comments: L foot drop, bilateral intermittent hearing loss Restrictions Weight Bearing Restrictions: No  Therapy/Group: Individual Therapy  Ailene Ravel, OTR/L,CBIS  Supplemental OT - Protection and [facility]  12/02/2021, 7:56 AM

## 2021-12-02 NOTE — Progress Notes (Signed)
Patient ID: Susan Horton, female   DOB: May 22, 1970, 51 y.o.   MRN: [id number]  Team Conference Report to Patient/Family  Team Conference discussion was reviewed with the patient and caregiver, including goals, any changes in plan of care and target discharge date.  Patient and caregiver express understanding and are in agreement.  The patient has a target discharge date of 12/12/21.  Sw met with patient and sister in room and provided team conference updates. Patient expressed that she still does not know where her landlord/owner is at in the process of renovating her bathroom and adding a ramp. Her sister reports that she believes the owner has sold their ownership to someone else. Patient anticipates to speak with someone to determine this information. Patient sister plans to be present for family education on Tues 111/28 10-12. No additional questions or concerns.  Dyanne Iha 12/02/2021, 3:00 PM

## 2021-12-02 NOTE — Progress Notes (Signed)
Occupational Therapy Session Note  Patient Details  Name: Susan Horton MRN: [id number] Date of Birth: Aug 05, 1970  Today's Date: 12/02/2021 OT Individual Time: 2683-4196 OT Individual Time Calculation (min): 54 min OT Missed Minutes: 6 min   Short Term Goals: Week 2:  OT Short Term Goal 1 (Week 2): Pt will complete shower transfer with CGA using LRAD OT Short Term Goal 2 (Week 2): Pt will complete ambulatory toilet transfer with CGA using LRAD OT Short Term Goal 3 (Week 2): Pt will tolerate greater than 5 mins in stance using LRAD during table top activity in prep for ADL needs  Skilled Therapeutic Interventions/Progress Updates:     Pt received resting in wc reporting nausea symptoms d/t dialysis and medications and reporting moderate pain in foot stating pain increases to 9-10/10 in standing. Pt denying need for pain medications or ice to treat pain. Pt instructed to communicate pain symptoms to therapist throughout session. Pt motivated and receptive to skilled OT session with a focus on ADL retraining and standing tolerance.   Pt requested to complete BADLs at sink at beginning of session. Pt completed UB bathing, grooming, and oral care at sink seated in wc supervision. Pt completed LB bathing standing at sink. Pt able to maintain balance for ~3 minutes while leaning on sink single UE supported. Pt weaved feet into pants crossing legs into figure four position with supervision and increased time. Pt able to stand to pull pants to waist close supervision. Following BADLs, Pt reported increased feelings of nausea proceeding to throw up in emesis bag. 6 minutes of missed OT treatment d/t nausea. Pt repeated oral hygiene at sink to clean out mouth and was provided water. MD entered room following Pt nausea and notified of Pt status Pt was left resting in wc with LLE supported on cushion on foot rest to decrease pain with wc breaks locked, call bell in reach, chair alarm on, and all needs met.    Therapy Documentation Precautions:  Precautions Precautions: Fall Precaution Comments: L foot drop, bilateral intermittent hearing loss Restrictions Weight Bearing Restrictions: No General: General OT Amount of Missed Time: 6 Minutes Vital Signs: Therapy Vitals Temp: 98.1 F (36.7 C) Temp Source: Oral Pulse Rate: 91 Resp: 18 BP: (Abnormal) 162/61 Patient Position (if appropriate): Sitting Oxygen Therapy SpO2: 100 % O2 Device: Room Air Pain: 9-10/10 in weight bearing, moderate pain at rest    ADL: ADL Upper Body Bathing: Setup Where Assessed-Upper Body Bathing: Shower Lower Body Bathing: Moderate cueing, Minimal assistance Where Assessed-Lower Body Bathing: Shower Upper Body Dressing: Setup Where Assessed-Upper Body Dressing:  (Seated on TB) Lower Body Dressing: Moderate assistance, Moderate cueing Where Assessed-Lower Body Dressing: Edge of bed Toileting: Moderate assistance Where Assessed-Toileting: Glass blower/designer: Moderate assistance Toilet Transfer Method: Stand pivot Toilet Transfer Equipment: Energy manager: Moderate assistance Social research officer, government Method:  Clarise Cruz Steady) Youth worker: Civil engineer, contracting with back, Grab bars   Therapy/Group: Individual Therapy  Janey Genta 12/02/2021, 9:07 AM

## 2021-12-02 NOTE — Patient Care Conference (Signed)
Inpatient RehabilitationTeam Conference and Plan of Care Update Date: 12/02/2021   Time: 11:38 AM    Patient Name: Susan Horton      Medical Record Number: [id number]  Date of Birth: 27-Sep-1970 Sex: Female         Room/Bed: 4M05C/4M05C-01 Payor Info: Payor: MEDICARE / Plan: MEDICARE PART A AND B / Product Type: *No Product type* /    Admit Date/Time:  11/23/2021 10:54 PM  Primary Diagnosis:  Sepsis [facility])  Hospital Problems: Principal Problem:   Sepsis ([place]) Active Problems:   Acute metabolic encephalopathy    Expected Discharge Date: Expected Discharge Date: 12/12/21  Team Members Present: Physician leading conference: Dr. Jennye Boroughs Social Worker Present: Erlene Quan, BSW Nurse Present: Tacy Learn, RN PT Present: Terence Lux, PT OT Present: Antony Salmon, OT PPS Coordinator present : Gunnar Fusi, SLP     Current Status/Progress Goal Weekly Team Focus  Bowel/Bladder   continent bowel/bladder   assist with toileting needs   toileting schedule    Swallow/Nutrition/ Hydration               ADL's   Set up A UB, CGA LB, CGA toileting. Biggest barrier is fluctuating pain in LLE that affects her balance, but she has improved her tolerance with WB. Most assist needed for sit > stand during self-care, but with ADLs is able to use compensatory methods for independence with little assist   Upgraded to mostly mod I due to progress   d/c planning, family education, cardiorespiratory endurance, dynamic balance, glute activation for sit > stands in prep for ADL    Mobility   supine<>sit supervision, sit<>stands with RW SUP/ CGA, stand<>pivot transfers with RW CGA, gait 20-13f with RW CGA, WC mobility 1045fsupervision, no stair training yet   supervision/min A steps, mod I WC mobility  functional mobility/transfers, pain management, dynamic standing balance/coordination, generalized strengthening and endurance, and gait training.    Communication                 Safety/Cognition/ Behavioral Observations               Pain   4/10 L leg pain   repositioning, prn meds   assess pain level q4    Skin   L leg incisons, L foot blister   dressing changes per orders/ prn  wounds remain clean and dry      Discharge Planning:  Discharging home wth sister and 3 daughters to assist 24/7   Team Discussion: Sepsis. Continent B/B. Pain managed with PRN pain medications. Incision to hip dehisced in small area and requires dressing changes. Also has abscess to buttocks that has been seeing general surgery since 2020 that has had several procedures on it and will/should have follow up once discharged. It is open now and bothers her if sits too long. Skin to left foot is healing. Patient is daily weight with dialysis M/W/F. N/V this am but patient feels related to dialysis and patient will address with them. Some therapy goals upgraded. Pain does affect balance and is WBAT.    Patient on target to meet rehab goals: yes, ambulating 40 feet with RW. 100 feet propelling w/c. No ste yet.   *See Care Plan and progress notes for long and short-term goals.   Revisions to Treatment Plan:  Medication adjustments, monitor labs, monitor weight  Teaching Needs: Medications, safety, gait/transfer training, skin/wound care, etc.   Current Barriers to Discharge: Decreased caregiver support, Home enviroment access/layout, Wound  care, Hemodialysis, and Weight bearing restrictions  Possible Resolutions to Barriers: Family education, nursing education, order recommended DME     Medical Summary Current Status: septic shock, ESRD, leukocytosis, CHF, hip wound, nausea  Barriers to Discharge: Renal Insufficiency/Failure;Medical stability  Barriers to Discharge Comments: septic shock, ESRD, leukocytosis, CHF, hip wound, nausea Possible Resolutions to [employer] Focus: monitor wounds, follow weight, zofran, monitor CBC   Continued Need for Acute  Rehabilitation Level of Care: The patient requires daily medical management by a physician with specialized training in physical medicine and rehabilitation for the following reasons: Direction of a multidisciplinary physical rehabilitation program to maximize functional independence : Yes Medical management of patient stability for increased activity during participation in an intensive rehabilitation regime.: Yes Analysis of laboratory values and/or radiology reports with any subsequent need for medication adjustment and/or medical intervention. : Yes   I attest that I was present, lead the team conference, and concur with the assessment and plan of the team.   Ernest Pine 12/02/2021, 4:18 PM

## 2021-12-02 NOTE — Progress Notes (Signed)
[facility] Progress Note   Subjective:  Seen in room - had some vomiting this AM, feeling a little better now. For HD later today. No dyspnea.  Objective Vitals:   12/01/21 1426 12/01/21 2013 12/02/21 0500 12/02/21 0600  BP: (!) 121/52 (!) 92/45  (!) 162/61  Pulse: 82 79  91  Resp: '17 16  18  '$ Temp: (!) 97.4 F (36.3 C) 98.4 F (36.9 C)  98.1 F (36.7 C)  TempSrc: Oral Oral  Oral  SpO2: 100% 98%  100%  Weight:   60.6 kg   Height:       Physical Exam General: Well appearing woman, NAD.  Heart: RRR; no murmur Lungs: CTAB; no rales Abdomen: soft Extremities: trace LE edema Dialysis Access: LUE AVF + bruit  Additional Objective Labs: Basic Metabolic Panel: Recent Labs  Lab 11/30/21 1546  NA 138  K 3.7  CL 96*  CO2 23  GLUCOSE 88  BUN 57*  CREATININE 6.48*  CALCIUM 8.3*  PHOS 6.0*   Liver Function Tests: Recent Labs  Lab 11/30/21 1546  ALBUMIN 1.9*   CBC: Recent Labs  Lab 11/30/21 1546  WBC 21.8*  HGB 7.5*  HCT 26.6*  MCV 83.1  PLT 499*   Medications:  fluconazole (DIFLUCAN) IV      aspirin  81 mg Oral Daily   atorvastatin  40 mg Oral Daily   Chlorhexidine Gluconate Cloth  6 each Topical Q0600   clopidogrel  75 mg Oral Q breakfast   darbepoetin (ARANESP) injection - NON-DIALYSIS  200 mcg Subcutaneous Q Wed-1800   doxercalciferol  7 mcg Intravenous Q M,W,F-HD   fluconazole  100 mg Oral Daily   heparin  5,000 Units Subcutaneous Q8H   lanthanum  1,000 mg Oral Q supper   leptospermum manuka honey  1 Application Topical Daily   lidocaine  1 patch Transdermal Q24H   metoCLOPramide  10 mg Oral TID AC   metoprolol succinate  12.5 mg Oral Daily   montelukast  10 mg Oral QHS   pantoprazole  40 mg Oral Daily   pneumococcal 20-valent conjugate vaccine  0.5 mL Intramuscular Tomorrow-1000   thiamine  100 mg Oral Daily    Dialysis Orders: MWF [facility] 3h 86mn 400/600  61kg  2/2 bath P2  Hep 4000  LUE AVF - mircera 200 mcg IV q2, last  10/16, due 10/30 - venofer '100mg'$  IV tiw thru 11/06 - doxercalciferol 7 ug tiw - sod thiosulfate 25 gm iv tiw   Assessment/Plan: Septic shock - sp course of Zosyn, now resolved -> CIR d/t debility  ESRD: Required CRRT on admit, now back to iHD MWF. Plan to continue regular MWF schedule so not to disrupt rehab schedule.  Will not run on Holiday schedule - HD later today (11/22). HTN/volume: BP controlled, mostly euvolemic on exam.  Anemia of ESRD: Hgb 7s. Continue max dose Aranesp 2041m weekly while here. No IV Fe with ferritin >5000 Secondary HPTH: CorrCa ok, Phos high. She has trouble tolerating any binder secondary to gastroparesis, trying Fosrenol once daily for now. Nutrition: Alb low,  continue supplements sometimes refusing Nepro Encephalopathy - D/t shock/meds?, better. Had some hearing loss and foot drop 11/7 now spontaneously improving. No offending meds Myoclonus: severe, after initial consult. Felt due to meds (cefepime, gabapentin and narcotics). Resolved.  Tumoral calcinosis L hip - long-standing painful issue for her, on NaThio empirically as op, although hasn't helped. [facility] Recultured 11/2 - NGTD. T2DM with  severe gastroparesis - Ongoing issue, s/p esophageal myotomy 09/17/21. Has issues swallow/keeping pills down. L foot pain - Xray negative, uric acid normal. VVS consulted, s/p LE angiography with improvement in pain Leukocytosis - improving. Last 17.3. Per PMD. Dispo: Tentative d/c plan of 12/12/21  Veneta Penton, PA-C 12/02/2021, 10:33 AM  [facility]

## 2021-12-02 NOTE — Progress Notes (Signed)
Patient refused Dialysis today d/t nausea and late day arrival of tech to transport patient. Education provided. PA notified and education provided to patient.

## 2021-12-02 NOTE — Progress Notes (Signed)
PROGRESS NOTE   Subjective/Complaints: Had some nausea and vomiting this AM. Reports this is chronic issue for her. Nausea has improved in  the afternoon.   ROS: +pruritus, +foot pain well controlled. +left hip pain 9/10, +diarrhea- improved with imodium, +multiple wounds. No CP or SOB. + Nausea   Objective:   No results found. Recent Labs    11/30/21 1546  WBC 21.8*  HGB 7.5*  HCT 26.6*  PLT 499*     Recent Labs    11/30/21 1546  NA 138  K 3.7  CL 96*  CO2 23  GLUCOSE 88  BUN 57*  CREATININE 6.48*  CALCIUM 8.3*      Intake/Output Summary (Last 24 hours) at 12/02/2021 1138 Last data filed at 12/02/2021 0735 Gross per 24 hour  Intake 697 ml  Output --  Net 697 ml      Pressure Injury 11/15/21 Sacrum Medial Stage 2 -  Partial thickness loss of dermis presenting as a shallow open injury with a red, pink wound bed without slough. (Active)  11/15/21 1030  Location: Sacrum  Location Orientation: Medial  Staging: Stage 2 -  Partial thickness loss of dermis presenting as a shallow open injury with a red, pink wound bed without slough.  Wound Description (Comments):   Present on Admission: Yes    Physical Exam: Vital Signs Blood pressure (!) 162/61, pulse 91, temperature 98.1 F (36.7 C), temperature source Oral, resp. rate 18, height _0  (1.651 m), weight 60.6 kg, SpO2 100 %. Gen: no distress, normal appearing, BMI 22.71 HEENT: oral mucosa pink and moist, NCAT Cardio: RRR Chest: normal effort, normal rate of breathing Abd: soft, non-distended Ext: trace LE edema b/l, left UE AVF Psych: pleasant, normal affect Musculoskeletal:     Cervical back: Normal range of motion.     Comments: Left hip tender with ROM, appears swollen. Large dressing in place. Left foot hypersensitive to touch.  Skin:    Comments:  Left hip Xeroform dressing in place.  2 incisions left posterior lateral thigh without odor  but with yellowish/tan drainage with clumpy, white debris. No odor. Wounds themselves appear pink without necrotic tissue.  Left foot: tip of great toe and 2nd/third toes appear ischemic. Skin on dorsum of left foot partially peeled due to lidocaine patch. AVF in LUE  Stage 2 to sacrum Neurological:     Mental Status: She is alert.     Comments:    Alert and oriented x 3. Normal insight and awareness. Intact Memory. Normal language and speech. Cranial nerve exam unremarkable. UE grossly 5/5. RLE 3+ to 4/5 prox to 4+/5 distally. LLE 2/5 prox to trace/5 ankle. Stocking glove sensory loss bilateral LE, L>R up to mid calf.   Dressing with MinA Psychiatric:        Mood and Affect: Mood normal.    Flat affect      Behavior: Behavior normal.    Assessment/Plan: 1. Functional deficits which require 3+ hours per day of interdisciplinary therapy in a comprehensive inpatient rehab setting. Physiatrist is providing close team supervision and 24 hour management of active medical problems listed below. Physiatrist and rehab team continue to assess barriers to  discharge/monitor patient progress toward functional and medical goals  Care Tool:  Bathing    Body parts bathed by patient: Right arm, Left arm, Chest, Abdomen, Front perineal area, Right upper leg, Left upper leg, Right lower leg, Left lower leg, Face, Buttocks   Body parts bathed by helper: Buttocks     Bathing assist Assist Level: Contact Guard/Touching assist Assistive Device Comment: stand pivot, shower chair   Upper Body Dressing/Undressing Upper body dressing   What is the patient wearing?: Pull over shirt    Upper body assist Assist Level: Independent with assistive device Assistive Device Comment: Seated on WC  Lower Body Dressing/Undressing Lower body dressing      What is the patient wearing?: Underwear/pull up, Pants     Lower body assist Assist for lower body dressing: Minimal Assistance - Patient > 75%      Toileting Toileting    Toileting assist Assist for toileting: Maximal Assistance - Patient 25 - 49% (Steady)     Transfers Chair/bed transfer  Transfers assist     Chair/bed transfer assist level: Contact Guard/Touching assist     Locomotion Ambulation   Ambulation assist   Ambulation activity did not occur: Safety/medical concerns (pain, fatigue, weakness/deconditioning, decreased balance)  Assist level: Contact Guard/Touching assist Assistive device: Walker-rolling Max distance: 73f   Walk 10 feet activity   Assist  Walk 10 feet activity did not occur: Safety/medical concerns (pain, fatigue, weakness/deconditioning, decreased balance)  Assist level: Contact Guard/Touching assist Assistive device: Walker-rolling   Walk 50 feet activity   Assist Walk 50 feet with 2 turns activity did not occur: Safety/medical concerns (pain, fatigue, weakness/deconditioning, decreased balance)         Walk 150 feet activity   Assist Walk 150 feet activity did not occur: Safety/medical concerns (pain, fatigue, weakness/deconditioning, decreased balance)         Walk 10 feet on uneven surface  activity   Assist Walk 10 feet on uneven surfaces activity did not occur: Safety/medical concerns (pain, fatigue, weakness/deconditioning, decreased balance)         Wheelchair     Assist Is the patient using a wheelchair?: Yes Type of Wheelchair: Manual    Wheelchair assist level: Supervision/Verbal cueing Max wheelchair distance: 154f   Wheelchair 50 feet with 2 turns activity    Assist        Assist Level: Supervision/Verbal cueing   Wheelchair 150 feet activity     Assist      Assist Level: Supervision/Verbal cueing   Blood pressure (!) 162/61, pulse 91, temperature 98.1 F (36.7 C), temperature source Oral, resp. rate 18, height _0  (1.651 m), weight 60.6 kg, SpO2 100 %.    Medical Problem List and Plan: 1. Functional deficits  secondary to septic shock/metabolic with debility and encephalopathy. Pt appears cognitively to be at baseline. -  Antibiotic therapy 10/29 - 11/6             -patient may shower if left hip is covered             -ELOS/Goals: 10-12 days, mod I to supervision goals  Continue CIR  -Seen by neuropsychology  working on coping/adjustment  -Team conference today please see physician documentation under team conference tab, met with team  to discuss problems,progress, and goals. Formulized individual treatment plan based on medical history, underlying problem and comorbidities.   2.  Impaired mobility: working on sit to stands. Provided accomodation letter for ramp and bathroom. -DVT/anticoagulation:  Pharmaceutical: continue  Heparin given bleeding risk             -antiplatelet therapy: Aspirin 81 mg daily and Plavix 75 mg daily 3. Left foot pain: changed Lidoderm patch to bottom of right foot, oxycodone as needed. Provided list of foods for pain relief. Manuka honey ordered for wound healing.  4. Mood/Behavior/Sleep: Provide emotional support             -antipsychotic agents: N/A 5. Neuropsych/cognition: This patient is capable of making decisions on her own behalf. 6. Left hip incision: Continue BID to TID dressing changes to left hip. Discussed with patient that surgeon stated continued drainage is to be expected. Apply manuka honey.  -WOC follow-up for left hip drainage. -completed course of broad spectrum abx on 11/6   -Assistance provided by orthopedics. 7. Fluids/Electrolytes/Nutrition: Routine in and outs with follow-up chemistries 8.  Peripheral vascular disease.  Follow-up vascular surgery for occluded stents status post right common iliac left common iliac stenting with balloon angioplasty left common femoral and stent left SFA 11/16/2021 per Dr. [name] 9.  Tumoral calcinosis.  Recent excision of tumoral calcinosis left hip thigh at [facility] 10/20/2021.  Weightbearing as tolerated.  Communicated MRI results to ortho 10.  End-stage renal disease.  Continue hemodialysis as directed. Discussed improved Creatinine to 3.08 on 11/14.   -11/22 scheduled for HD today 11.  Acute on chronic anemia.  Continue iron supplement 12.  Leukocytosis.  (20k)  -Broad spectrum antibiotic therapy completed 11/16/2021 and monitor.  Latest blood culture showed no growth             -appearance of drainage idiscussed with ortho and is to be expected. Recent CT (10/30) demonstrating changes c/w tumoral calcinosis and mild cellulitis.              -MRI hip obtianed and stable              -ischemic toes LLE could also be a source             -continue wound care as above             -pt feels well otherwise and no other signs of infection             -consider H/O consult if persistent  -WBC yesterday was 21.8, continue to monitor 13.  Asthma without exacerbation as well as remote history of tobacco abuse.  Continue inhalers as directed check oxygen saturations every shift 14. Diet controlled Diabetes mellitus. Blood sugar checks discontinued 15.  Hypertension.  Toprol-XL 25 mg daily.  Monitor with increased mobility -11/22 well controlled continue to monitor 16.  Abrupt onset bilateral sensorineural hearing loss.  Confirmed by audiology 11/7 the symptoms have resolved.  Follow-up outpatient. 17.  Diastolic and systolic congestive heart failure.  Monitor for any signs of fluid overload. Daily weights ordered Filed Weights   11/30/21 1531 12/01/21 0549 12/02/21 0500  Weight: 63.2 kg 64.6 kg 60.6 kg  Weight decreased today, continue to monitor  18. Pruritus: sarna lotion ordered.  19. Diarrhea: continue imodium 20. Insomnia: secondary to diarrhea, continue imodium.  21. Dysuria: UA/UC ordered 22. Chronic nausea  -Continue Zofran PRN  -Suspect GERD may be contributing, increase PPI to BID 23. GERD  -increase pantoprazole to BID  LOS: 9 days A FACE TO FACE EVALUATION WAS PERFORMED  Jennye Boroughs 12/02/2021, 11:38 AM

## 2021-12-02 NOTE — Progress Notes (Signed)
Physical Therapy Session Note  Patient Details  Name: Susan Horton MRN: [id number] Date of Birth: 02-11-1970  Today's Date: 12/02/2021 PT Individual Time: 1055-1150 PT Individual Time Calculation (min): 55 min   Short Term Goals: Week 2:  PT Short Term Goal 1 (Week 2): pt will transfer sit<>stand with LRAD and consistant SUP PT Short Term Goal 2 (Week 2): pt will transfer bed<>chair with LRAD and consistant SUP PT Short Term Goal 3 (Week 2): pt will ambulate 66f with LRAD and SUP PT Short Term Goal 4 (Week 2): Pt will initiate stair training.  Skilled Therapeutic Interventions/Progress Updates:    Chart reviewed and pt agreeable to therapy. Pt received semi-reclined in bed with 10/10 c/o pain in BLE but declines medication until she finishes therapy. Also of note, pt reporting nausea and states she had bouts of vomiting this AM, but is willing to participate in PT. Session focused on functional transfers and stair initiation to promote safe home access. Pt initiated session with transfer from bed>WC using CGA + RW. Pt then transferred to therapy gym for time conservation. Pt completed 3x5 single step up/down without turning at stairs with B hand rails + CGA with significant rest between sets. Pt demonstrated understanding of safe stepping technique in setting on LLE weakness.  Pt then instructed on technique for turning on step and stepping down. Pt completed 1 step up, turn around, step down at stairs with B handrails + MinA. Pt then highly fatigued, so returned to room. At end of session, [facility], nurse call bell and all needs in reach.     Therapy Documentation Precautions:  Precautions Precautions: Fall Precaution Comments: L foot drop, bilateral intermittent hearing loss Restrictions Weight Bearing Restrictions: No General:      Therapy/Group: Individual Therapy  KMarquette Old PT, DPT 12/02/2021, 11:57 AM

## 2021-12-03 DIAGNOSIS — A419 Sepsis, unspecified organism: Secondary | ICD-10-CM | POA: Diagnosis not present

## 2021-12-03 DIAGNOSIS — N186 End stage renal disease: Secondary | ICD-10-CM | POA: Diagnosis not present

## 2021-12-03 DIAGNOSIS — I10 Essential (primary) hypertension: Secondary | ICD-10-CM | POA: Diagnosis not present

## 2021-12-03 DIAGNOSIS — K5900 Constipation, unspecified: Secondary | ICD-10-CM

## 2021-12-03 DIAGNOSIS — R112 Nausea with vomiting, unspecified: Secondary | ICD-10-CM | POA: Diagnosis not present

## 2021-12-03 LAB — RENAL FUNCTION PANEL
Albumin: 1.8 g/dL — ABNORMAL LOW (ref 3.5–5.0)
Anion gap: 18 — ABNORMAL HIGH (ref 5–15)
BUN: 57 mg/dL — ABNORMAL HIGH (ref 6–20)
CO2: 24 mmol/L (ref 22–32)
Calcium: 7.6 mg/dL — ABNORMAL LOW (ref 8.9–10.3)
Chloride: 96 mmol/L — ABNORMAL LOW (ref 98–111)
Creatinine, Ser: 6.64 mg/dL — ABNORMAL HIGH (ref 0.44–1.00)
GFR, Estimated: 7 mL/min — ABNORMAL LOW (ref >60)
Glucose, Bld: 103 mg/dL — ABNORMAL HIGH (ref 70–99)
Phosphorus: 6.5 mg/dL — ABNORMAL HIGH (ref 2.5–4.6)
Potassium: 3.7 mmol/L (ref 3.5–5.1)
Sodium: 138 mmol/L (ref 135–145)

## 2021-12-03 MED ORDER — SORBITOL 70 % SOLN: 30.0000 mL | Freq: Every day | Status: DC | PRN

## 2021-12-03 MED ORDER — POLYETHYLENE GLYCOL 3350 17 G PO PACK: 17.0000 g | PACK | Freq: Every day | ORAL | Status: DC | PRN

## 2021-12-03 NOTE — Progress Notes (Signed)
[facility] Progress Note   Subjective:   Pt declined HD yesterday BPs stable on RA this AM, N/V improved AM Labs ordered  Objective Vitals:   12/02/21 1337 12/02/21 2037 12/03/21 0609 12/03/21 0700  BP: 135/64 (!) 102/42 (!) 104/43   Pulse: 86 78 77   Resp: '18 18 18   '$ Temp: 97.8 F (36.6 C) 98.6 F (37 C) 98.5 F (36.9 C)   TempSrc: Oral Oral Oral   SpO2: 100% 99% 97%   Weight:    61.9 kg  Height:       Physical Exam General: Well appearing woman, NAD.  Heart: RRR; no murmur Lungs: CTAB; no rales Abdomen: soft Extremities: trace LE edema Dialysis Access: LUE AVF + bruit  Additional Objective Labs: Basic Metabolic Panel: Recent Labs  Lab 11/30/21 1546  NA 138  K 3.7  CL 96*  CO2 23  GLUCOSE 88  BUN 57*  CREATININE 6.48*  CALCIUM 8.3*  PHOS 6.0*    Liver Function Tests: Recent Labs  Lab 11/30/21 1546  ALBUMIN 1.9*    CBC: Recent Labs  Lab 11/30/21 1546  WBC 21.8*  HGB 7.5*  HCT 26.6*  MCV 83.1  PLT 499*    Medications:  fluconazole (DIFLUCAN) IV      aspirin  81 mg Oral Daily   atorvastatin  40 mg Oral Daily   Chlorhexidine Gluconate Cloth  6 each Topical Q0600   clopidogrel  75 mg Oral Q breakfast   darbepoetin (ARANESP) injection - NON-DIALYSIS  200 mcg Subcutaneous Q Wed-1800   doxercalciferol  7 mcg Intravenous Q M,W,F-HD   fluconazole  100 mg Oral Daily   heparin  5,000 Units Subcutaneous Q8H   lanthanum  1,000 mg Oral Q supper   leptospermum manuka honey  1 Application Topical Daily   lidocaine  1 patch Transdermal Q24H   metoCLOPramide  10 mg Oral TID AC   metoprolol succinate  12.5 mg Oral Daily   montelukast  10 mg Oral QHS   pantoprazole  40 mg Oral BID   pneumococcal 20-valent conjugate vaccine  0.5 mL Intramuscular Tomorrow-1000   thiamine  100 mg Oral Daily    Dialysis Orders: MWF [facility] 3h 5mn 400/600  61kg  2/2 bath P2  Hep 4000  LUE AVF - mircera 200 mcg IV q2, last 10/16, due 10/30 - venofer  '100mg'$  IV tiw thru 11/06 - doxercalciferol 7 ug tiw - sod thiosulfate 25 gm iv tiw   Assessment/Plan: Septic shock - sp course of Zosyn, now resolved -> CIR d/t debility  ESRD: Required CRRT on admit, now back to iHD MWF. Plan to continue regular MWF schedule so not to disrupt rehab schedule.  Refused HD 11/22, tentative for 11/24, AM labs pending HTN/volume: BP controlled, mostly euvolemic on exam.  Anemia of ESRD: Hgb 7s. Continue max dose Aranesp 209m weekly while here. No IV Fe with ferritin >5000 Secondary HPTH: CorrCa ok, Phos high. She has trouble tolerating any binder secondary to gastroparesis, trying Fosrenol once daily for now. Nutrition: Alb low,  continue supplements sometimes refusing Nepro Encephalopathy - D/t shock/meds?, better. Had some hearing loss and foot drop 11/7 now spontaneously improving. No offending meds Myoclonus: severe, after initial consult. Felt due to meds (cefepime, gabapentin and narcotics). Resolved.  Tumoral calcinosis L hip - long-standing painful issue for her, on NaThio empirically as op, although hasn't helped. [facility] Recultured 11/2 - NGTD. T2DM with severe gastroparesis - Ongoing issue,  s/p esophageal myotomy 09/17/21. Has issues swallow/keeping pills down. L foot pain - Xray negative, uric acid normal. VVS consulted, s/p LE angiography with improvement in pain Leukocytosis - improving. Last 17.3. Per PMD. Dispo: Tentative d/c plan of 12/12/21  Rexene Agent, MD  12/03/2021, 10:35 AM  [facility]

## 2021-12-03 NOTE — Progress Notes (Addendum)
PROGRESS NOTE   Subjective/Complaints: She declined dialysis yesterday. She continues to have N/V although it is improved, this is chronic for many years. She also has acid reflux. Nephrology plans on HD 11/24.   ROS: +pruritus, +foot pain well controlled. +left hip pain 9/10, denies constipation symtoms, +multiple wounds. No CP or SOB. + Nausea, + GERD   Objective:   No results found. Recent Labs    11/30/21 1546  WBC 21.8*  HGB 7.5*  HCT 26.6*  PLT 499*     Recent Labs    11/30/21 1546 12/03/21 0941  NA 138 138  K 3.7 3.7  CL 96* 96*  CO2 23 24  GLUCOSE 88 103*  BUN 57* 57*  CREATININE 6.48* 6.64*  CALCIUM 8.3* 7.6*      Intake/Output Summary (Last 24 hours) at 12/03/2021 1103 Last data filed at 12/03/2021 0730 Gross per 24 hour  Intake 716 ml  Output --  Net 716 ml      Pressure Injury 11/15/21 Sacrum Medial Stage 2 -  Partial thickness loss of dermis presenting as a shallow open injury with a red, pink wound bed without slough. (Active)  11/15/21 1030  Location: Sacrum  Location Orientation: Medial  Staging: Stage 2 -  Partial thickness loss of dermis presenting as a shallow open injury with a red, pink wound bed without slough.  Wound Description (Comments):   Present on Admission: Yes    Physical Exam: Vital Signs Blood pressure (!) 104/43, pulse 77, temperature 98.5 F (36.9 C), temperature source Oral, resp. rate 18, height '5\' 5"'$  (1.651 m), weight 61.9 kg, SpO2 97 %. Gen: no distress, normal appearing, BMI 22.71 HEENT: oral mucosa pink and moist, NCAT Cardio: RRR, no MRG Chest: normal effort, normal rate of breathing Abd: soft, non-distended Ext: trace LE edema b/l, left UE AVF Psych: pleasant, normal affect Musculoskeletal:     Cervical back: Normal range of motion.     Comments: Left hip tender with ROM, appears swollen. Large dressing in place. Left foot hypersensitive to touch.   Skin:    Comments:  Left hip Xeroform dressing in place.  2 incisions left posterior lateral thigh without odor but with yellowish/tan drainage with clumpy, white debris. No odor. Wounds themselves appear pink without necrotic tissue.  Left foot: tip of great toe and 2nd/third toes appear ischemic. Skin on dorsum of left foot partially peeled due to lidocaine patch. AVF in LUE  Stage 2 to sacrum Neurological:     Mental Status: She is alert.     Comments:    Alert and oriented x 3. Normal insight and awareness. Intact Memory. Normal language and speech. Cranial nerve exam unremarkable. UE grossly 5/5. RLE 3+ to 4/5 prox to 4+/5 distally. LLE 2/5 prox to trace/5 ankle. Stocking glove sensory loss bilateral LE, L>R up to mid calf.   Dressing with MinA Psychiatric:        Mood and Affect: Mood normal.    Flat affect      Behavior: Behavior normal.    Assessment/Plan: 1. Functional deficits which require 3+ hours per day of interdisciplinary therapy in a comprehensive inpatient rehab setting. Physiatrist is providing close  team supervision and 24 hour management of active medical problems listed below. Physiatrist and rehab team continue to assess barriers to discharge/monitor patient progress toward functional and medical goals  Care Tool:  Bathing    Body parts bathed by patient: Right arm, Left arm, Chest, Abdomen, Front perineal area, Right upper leg, Left upper leg, Right lower leg, Left lower leg, Face, Buttocks   Body parts bathed by helper: Buttocks     Bathing assist Assist Level: Contact Guard/Touching assist Assistive Device Comment: stand pivot, shower chair   Upper Body Dressing/Undressing Upper body dressing   What is the patient wearing?: Pull over shirt    Upper body assist Assist Level: Independent with assistive device Assistive Device Comment: Seated on WC  Lower Body Dressing/Undressing Lower body dressing      What is the patient wearing?: Underwear/pull  up, Pants     Lower body assist Assist for lower body dressing: Minimal Assistance - Patient > 75%     Toileting Toileting    Toileting assist Assist for toileting: Maximal Assistance - Patient 25 - 49% (Steady)     Transfers Chair/bed transfer  Transfers assist     Chair/bed transfer assist level: Contact Guard/Touching assist     Locomotion Ambulation   Ambulation assist   Ambulation activity did not occur: Safety/medical concerns (pain, fatigue, weakness/deconditioning, decreased balance)  Assist level: Contact Guard/Touching assist Assistive device: Walker-rolling Max distance: 45f   Walk 10 feet activity   Assist  Walk 10 feet activity did not occur: Safety/medical concerns (pain, fatigue, weakness/deconditioning, decreased balance)  Assist level: Contact Guard/Touching assist Assistive device: Walker-rolling   Walk 50 feet activity   Assist Walk 50 feet with 2 turns activity did not occur: Safety/medical concerns (pain, fatigue, weakness/deconditioning, decreased balance)         Walk 150 feet activity   Assist Walk 150 feet activity did not occur: Safety/medical concerns (pain, fatigue, weakness/deconditioning, decreased balance)         Walk 10 feet on uneven surface  activity   Assist Walk 10 feet on uneven surfaces activity did not occur: Safety/medical concerns (pain, fatigue, weakness/deconditioning, decreased balance)         Wheelchair     Assist Is the patient using a wheelchair?: Yes Type of Wheelchair: Manual    Wheelchair assist level: Supervision/Verbal cueing Max wheelchair distance: 1521f   Wheelchair 50 feet with 2 turns activity    Assist        Assist Level: Supervision/Verbal cueing   Wheelchair 150 feet activity     Assist      Assist Level: Supervision/Verbal cueing   Blood pressure (!) 104/43, pulse 77, temperature 98.5 F (36.9 C), temperature source Oral, resp. rate 18, height 5'  5" (1.651 m), weight 61.9 kg, SpO2 97 %.    Medical Problem List and Plan: 1. Functional deficits secondary to septic shock/metabolic with debility and encephalopathy. Pt appears cognitively to be at baseline. -  Antibiotic therapy 10/29 - 11/6             -patient may shower if left hip is covered             -ELOS/Goals: 10-12 days, mod I to supervision goals  Continue CIR  -Seen by neuropsychology  working on coping/adjustment  -No therapy today due to holiday-resume tomorrow 2.  Impaired mobility: working on sit to stands. Provided accomodation letter for ramp and bathroom. -DVT/anticoagulation:  Pharmaceutical: continue Heparin given bleeding risk             -  antiplatelet therapy: Aspirin 81 mg daily and Plavix 75 mg daily 3. Left foot pain: changed Lidoderm patch to bottom of right foot, oxycodone as needed. Provided list of foods for pain relief. Manuka honey ordered for wound healing.  4. Mood/Behavior/Sleep: Provide emotional support             -antipsychotic agents: N/A 5. Neuropsych/cognition: This patient is capable of making decisions on her own behalf. 6. Left hip incision: Continue BID to TID dressing changes to left hip. Discussed with patient that surgeon stated continued drainage is to be expected. Apply manuka honey.  -WOC follow-up for left hip drainage. -completed course of broad spectrum abx on 11/6   -Assistance provided by orthopedics. 7. Fluids/Electrolytes/Nutrition: Routine in and outs with follow-up chemistries 8.  Peripheral vascular disease.  Follow-up vascular surgery for occluded stents status post right common iliac left common iliac stenting with balloon angioplasty left common femoral and stent left SFA 11/16/2021 per Dr. [name] 9.  Tumoral calcinosis.  Recent excision of tumoral calcinosis left hip thigh at [facility] 10/20/2021.  Weightbearing as tolerated. Communicated MRI results to ortho 10.  End-stage renal disease.  Continue hemodialysis as  directed. Discussed improved Creatinine to 3.08 on 11/14.   -11/23 Declined HD yesterday, she says she will agree to do this friday 11.  Acute on chronic anemia.  Continue iron supplement 12.  Leukocytosis.  (20k)  -Broad spectrum antibiotic therapy completed 11/16/2021 and monitor.  Latest blood culture showed no growth             -appearance of drainage idiscussed with ortho and is to be expected. Recent CT (10/30) demonstrating changes c/w tumoral calcinosis and mild cellulitis.              -MRI hip obtianed and stable              -ischemic toes LLE could also be a source             -continue wound care as above             -pt feels well otherwise and no other signs of infection             -consider H/O consult if persistent  -WBC yesterday was 21.8, continue to monitor 13.  Asthma without exacerbation as well as remote history of tobacco abuse.  Continue inhalers as directed check oxygen saturations every shift 14. Diet controlled Diabetes mellitus. Blood sugar checks discontinued 15.  Hypertension.  Toprol-XL 25 mg daily.  Monitor with increased mobility -11/23 well controlled, monitor 16.  Abrupt onset bilateral sensorineural hearing loss.  Confirmed by audiology 11/7 the symptoms have resolved.  Follow-up outpatient. 17.  Diastolic and systolic congestive heart failure.  Monitor for any signs of fluid overload. Daily weights ordered Filed Weights   12/01/21 0549 12/02/21 0500 12/03/21 0700  Weight: 64.6 kg 60.6 kg 61.9 kg  Weight decreased today, continue to monitor  18. Pruritus: sarna lotion ordered.  19. Diarrhea: continue imodium  -11/23 Reports BM 2 days ago, order miralax prN in case constipation develops 20. Insomnia: secondary to diarrhea, continue imodium.  21. Dysuria: UA/UC ordered 22. Chronic nausea  -Continue Zofran PRN  -Suspect GERD may be contributing, increase PPI to BID  -11/23 a little improved Monitor response to increased PPI frequency 23.  GERD  -increase pantoprazole to BID  LOS: 10 days A FACE TO FACE EVALUATION WAS PERFORMED  Jennye Boroughs 12/03/2021, 11:03 AM

## 2021-12-04 DIAGNOSIS — I10 Essential (primary) hypertension: Secondary | ICD-10-CM | POA: Diagnosis not present

## 2021-12-04 DIAGNOSIS — A419 Sepsis, unspecified organism: Secondary | ICD-10-CM | POA: Diagnosis not present

## 2021-12-04 DIAGNOSIS — K5900 Constipation, unspecified: Secondary | ICD-10-CM | POA: Diagnosis not present

## 2021-12-04 DIAGNOSIS — N186 End stage renal disease: Secondary | ICD-10-CM | POA: Diagnosis not present

## 2021-12-04 MED ORDER — POLYETHYLENE GLYCOL 3350 17 G PO PACK
17.0000 g | PACK | Freq: Every day | ORAL | Status: DC | PRN
  Administered 2021-12-04: 17 g via ORAL
  Filled 2021-12-04: qty 1

## 2021-12-04 MED ORDER — LACTULOSE 10 GM/15ML PO SOLN
10.0000 g | Freq: Every day | ORAL | Status: DC | PRN
  Filled 2021-12-04: qty 15

## 2021-12-04 NOTE — Progress Notes (Signed)
[facility] Progress Note   Subjective:   Tired after doing rehab For HD today  Objective Vitals:   12/03/21 0700 12/03/21 1403 12/03/21 2010 12/04/21 0556  BP:  (!) 119/50 (!) 113/45 (!) 125/46  Pulse:  82 73 76  Resp:  '16 18 18  '$ Temp:  97.7 F (36.5 C) 98.7 F (37.1 C) 98.3 F (36.8 C)  TempSrc:  Oral Oral Oral  SpO2:  99% 98% 99%  Weight: 61.9 kg     Height:       Physical Exam General: Well appearing woman, NAD.  Heart: RRR; no murmur Lungs: CTAB; no rales Abdomen: soft Extremities: trace LE edema Dialysis Access: LUE AVF + bruit  Additional Objective Labs: Basic Metabolic Panel: Recent Labs  Lab 11/30/21 1546 12/03/21 0941  NA 138 138  K 3.7 3.7  CL 96* 96*  CO2 23 24  GLUCOSE 88 103*  BUN 57* 57*  CREATININE 6.48* 6.64*  CALCIUM 8.3* 7.6*  PHOS 6.0* 6.5*    Liver Function Tests: Recent Labs  Lab 11/30/21 1546 12/03/21 0941  ALBUMIN 1.9* 1.8*    CBC: Recent Labs  Lab 11/30/21 1546  WBC 21.8*  HGB 7.5*  HCT 26.6*  MCV 83.1  PLT 499*    Medications:  fluconazole (DIFLUCAN) IV      aspirin  81 mg Oral Daily   atorvastatin  40 mg Oral Daily   Chlorhexidine Gluconate Cloth  6 each Topical Q0600   clopidogrel  75 mg Oral Q breakfast   darbepoetin (ARANESP) injection - NON-DIALYSIS  200 mcg Subcutaneous Q Wed-1800   doxercalciferol  7 mcg Intravenous Q M,W,F-HD   fluconazole  100 mg Oral Daily   heparin  5,000 Units Subcutaneous Q8H   lanthanum  1,000 mg Oral Q supper   leptospermum manuka honey  1 Application Topical Daily   lidocaine  1 patch Transdermal Q24H   metoCLOPramide  10 mg Oral TID AC   metoprolol succinate  12.5 mg Oral Daily   montelukast  10 mg Oral QHS   pantoprazole  40 mg Oral BID   pneumococcal 20-valent conjugate vaccine  0.5 mL Intramuscular Tomorrow-1000   thiamine  100 mg Oral Daily    Dialysis Orders: MWF [facility] 3h 11mn 400/600  61kg  2/2 bath P2  Hep 4000  LUE AVF - mircera 200 mcg IV  q2, last 10/16, due 10/30 - venofer '100mg'$  IV tiw thru 11/06 - doxercalciferol 7 ug tiw - sod thiosulfate 25 gm iv tiw   Assessment/Plan: Septic shock - sp course of Zosyn, now resolved -> CIR d/t debility  ESRD: Required CRRT on admit, now back to iHD MWF. Plan to continue regular MWF schedule so not to disrupt rehab schedule.  Refused HD 11/22, for today 11/24 HTN/volume: BP controlled, mostly euvolemic on exam.  Anemia of ESRD: Hgb 7s. Continue max dose Aranesp 2034m weekly while here. No IV Fe with ferritin >5000 Secondary HPTH: CorrCa ok, Phos high. She has trouble tolerating any binder secondary to gastroparesis, trying Fosrenol once daily for now. Nutrition: Alb low,  continue supplements sometimes refusing Nepro Encephalopathy - D/t shock/meds?, better. Had some hearing loss and foot drop 11/7 now spontaneously improving. No offending meds Myoclonus: severe, after initial consult. Felt due to meds (cefepime, gabapentin and narcotics). Resolved.  Tumoral calcinosis L hip - long-standing painful issue for her, on NaThio empirically as op, although hasn't helped. [facility]. Recultured 11/2 - NGTD. T2DM with severe  gastroparesis - Ongoing issue, s/p esophageal myotomy 09/17/21. Has issues swallow/keeping pills down. L foot pain - Xray negative, uric acid normal. VVS consulted, s/p LE angiography with improvement in pain Leukocytosis - improving. Last 17.3. Per PMD. Dispo: Tentative d/c plan of 12/12/21  Rexene Agent, MD  12/04/2021, 11:00 AM  [facility]

## 2021-12-04 NOTE — Progress Notes (Signed)
Patient off the unit to dialysis. Sanda Linger, RN

## 2021-12-04 NOTE — Plan of Care (Signed)
  Problem: Consults Goal: RH GENERAL PATIENT EDUCATION Description: See Patient Education module for education specifics. Outcome: Progressing Goal: Skin Care Protocol Initiated - if Braden Score 18 or less Description: If consults are not indicated, leave blank or document N/A Outcome: Progressing Goal: Nutrition Consult-if indicated Outcome: Progressing   Problem: RH BOWEL ELIMINATION Goal: RH STG MANAGE BOWEL WITH ASSISTANCE Description: STG Manage Bowel with min Assistance. Outcome: Progressing Goal: RH STG MANAGE BOWEL W/MEDICATION W/ASSISTANCE Description: STG Manage Bowel with Medication with min Assistance. Outcome: Progressing   Problem: RH BLADDER ELIMINATION Goal: RH STG MANAGE BLADDER WITH ASSISTANCE Description: STG Manage Bladder With min Assistance Outcome: Progressing   Problem: RH SKIN INTEGRITY Goal: RH STG SKIN FREE OF INFECTION/BREAKDOWN Description: Skin will be free of any additional breakdown/infection from the time being admitted to rehab with min assist Outcome: Progressing Goal: RH STG MAINTAIN SKIN INTEGRITY WITH ASSISTANCE Description: STG Maintain Skin Integrity With min Assistance. Outcome: Progressing Goal: RH STG ABLE TO PERFORM INCISION/WOUND CARE W/ASSISTANCE Description: STG Able To Perform Incision/Wound Care With total Assistance. Outcome: Progressing   Problem: RH SAFETY Goal: RH STG ADHERE TO SAFETY PRECAUTIONS W/ASSISTANCE/DEVICE Description: STG Adhere to Safety Precautions With cuing Assistance/Device. Outcome: Progressing   Problem: RH PAIN MANAGEMENT Goal: RH STG PAIN MANAGED AT OR BELOW PT'S PAIN GOAL Description: Pain will be managed at 4 out of 10 on pain scale with min assist using PRN medications Outcome: Progressing   Problem: RH KNOWLEDGE DEFICIT GENERAL Goal: RH STG INCREASE KNOWLEDGE OF SELF CARE AFTER HOSPITALIZATION Description: Patient/caregiver will be able to managed medications, skin/wound care, and self care  from nursing education and handouts independently  Outcome: Progressing   Problem: Fluid Volume: Goal: Hemodynamic stability will improve Outcome: Progressing   Problem: Clinical Measurements: Goal: Diagnostic test results will improve Outcome: Progressing Goal: Signs and symptoms of infection will decrease Outcome: Progressing   Problem: Respiratory: Goal: Ability to maintain adequate ventilation will improve Outcome: Progressing   Problem: Education: Goal: Understanding of CV disease, CV risk reduction, and recovery process will improve Outcome: Progressing Goal: Individualized Educational Video(s) Outcome: Progressing   Problem: Activity: Goal: Ability to return to baseline activity level will improve Outcome: Progressing   Problem: Cardiovascular: Goal: Ability to achieve and maintain adequate cardiovascular perfusion will improve Outcome: Progressing Goal: Vascular access site(s) Level 0-1 will be maintained Outcome: Progressing   Problem: Health Behavior/Discharge Planning: Goal: Ability to safely manage health-related needs after discharge will improve Outcome: Progressing   Problem: Education: Goal: Knowledge of General Education information will improve Description: Including pain rating scale, medication(s)/side effects and non-pharmacologic comfort measures Outcome: Progressing   Problem: Health Behavior/Discharge Planning: Goal: Ability to manage health-related needs will improve Outcome: Progressing

## 2021-12-04 NOTE — Progress Notes (Signed)
Physical Therapy Session Note  Patient Details  Name: Susan Horton MRN: [id number] Date of Birth: 03/12/1970  Today's Date: 12/04/2021 PT Individual Time: 5726-2035 and 1100-1155 PT Individual Time Calculation (min): 70 min and 55 min  Short Term Goals: Week 1:  PT Short Term Goal 1 (Week 1): pt will transfer sit<>stand with LRAD and mod A consistantly PT Short Term Goal 1 - Progress (Week 1): Met PT Short Term Goal 2 (Week 1): pt will transfer bed<>chair with LRAD and min A PT Short Term Goal 2 - Progress (Week 1): Met PT Short Term Goal 3 (Week 1): pt will ambulate 38f with LRAD and mod A PT Short Term Goal 3 - Progress (Week 1): Met Week 2:  PT Short Term Goal 1 (Week 2): pt will transfer sit<>stand with LRAD and consistant SUP PT Short Term Goal 2 (Week 2): pt will transfer bed<>chair with LRAD and consistant SUP PT Short Term Goal 3 (Week 2): pt will ambulate 790fwith LRAD and SUP PT Short Term Goal 4 (Week 2): Pt will initiate stair training.  Skilled Therapeutic Interventions/Progress Updates:   Treatment Session 1 Received pt sitting in WC reporting feeling nauseous and having intense pain rated 10/10 in bilateral feet - RN notified and present to administer medication. Session with emphasis on functional mobility/transfers, toileting, generalized strengthening and endurance, and ambulation. Noted swelling in RLE - provided pt with R elevating legrest and attempted to don compression sock, however pt unable to tolerate on either LE. Pt reported urge to have BM - [facility] and min A (increased difficulty standing today) and ambulated to/from bathroom with RW and close supervision. Pt required min/mod A for clothing management, but ultimately unable to void or have BM. Required mod A to stand from commode and [facility] as pt unable to make it all the way back due to pain. Pt then with multiple emesis episodes [facility]  [facility]  [facility] purposes and performed the following exercises sitting [facility] and verbal cues for technique with emphasis on strength/ROM: -LAQ 2x8 bilaterally with 0.75lb ankle weight on RLE progressing to 0.75lb ankle weight on LLE -hip flexion 2x8 bilaterally with 0.75lb ankle weight -heel/toe raises x15 bilaterally - decreased DF ROM on RLE and unable to DF on LLE -hip abduction with red TB 2x12 -hamstring curls with red TB x10 bilaterally Returned to room and concluded session with pt sitting in WC, needs within reach, and chair pad alarm on.   Treatment Session 2 Received pt sitting in WC reporting feeling sleepy from pain medication. Pt agreeable to PT treatment and reported pain 9/10 in bilateral feet (related to neuropathy). Session with emphasis on functional mobility/transfers, generalized strengthening and endurance, community navigation, and ambulation. Recreational therapist present during session. Prepared to propel WC down hallway, but returned to room due to urge to vomit - 1 episode of emesis. Pt agreeable to go to giftshop this morning for change in environmental stimuli and to uplift spirits and transported to giftshop in WC dependently. Pt politely declined ambulating through giftshop due to pain in feet but was able to propel WC with supervision with emphasis on navigating tight spaces/turns while shopping. Pt able set herself up at counter and purchase items without assist. Pt then performed WC mobility ~505fsing BUE and supervision to elevators and returned to 4th floor. Pt  stood from [facility] with RW and min A and ambulated 33f with RW and close supervision back towards room - limited by pain. Concluded session with pt sitting in WC, needs within reach, and chair pad alarm on.   Therapy Documentation Precautions:  Precautions Precautions: Fall Precaution Comments: L foot drop, bilateral intermittent  hearing loss Restrictions Weight Bearing Restrictions: No  Therapy/Group: Individual Therapy AAlfonse AlpersPT, DPT  12/04/2021, 6:53 AM

## 2021-12-04 NOTE — Progress Notes (Signed)
Received patient in bed to unit.  Alert and oriented.  Informed consent signed and in chart.   Treatment initiated:1333 Treatment completed: 0721  Patient tolerated well. Pt began having cromps in her foot towards the end of tx. She opted to end tx 61mn early. Transported back to the room  Alert, without acute distress.  Hand-off given to patient's nurse.   Access used: AVF  Access issues: none  Total UF removed: 8049mMedication(s) given: none Post HD VS: 126/49,102,99,14 Post HD weight: 61.0kg   [facility]

## 2021-12-04 NOTE — Progress Notes (Addendum)
PROGRESS NOTE   Subjective/Complaints: HD planned for today. She reports tingling pain in her feet. Reports gabapentin made her feel confused when she used it in the past. She has not had a BM in few days.   ROS: +pruritus, +foot pain - tingling. +left hip pain, denies constipation symtoms, +multiple wounds. No CP or SOB. + Nausea-chronic, + GERD   Objective:   No results found. No results for input(s): "WBC", "HGB", "HCT", "PLT" in the last 72 hours.   Recent Labs    12/03/21 0941  NA 138  K 3.7  CL 96*  CO2 24  GLUCOSE 103*  BUN 57*  CREATININE 6.64*  CALCIUM 7.6*      Intake/Output Summary (Last 24 hours) at 12/04/2021 0826 Last data filed at 12/03/2021 1742 Gross per 24 hour  Intake 236 ml  Output --  Net 236 ml      Pressure Injury 11/15/21 Sacrum Medial Stage 2 -  Partial thickness loss of dermis presenting as a shallow open injury with a red, pink wound bed without slough. (Active)  11/15/21 1030  Location: Sacrum  Location Orientation: Medial  Staging: Stage 2 -  Partial thickness loss of dermis presenting as a shallow open injury with a red, pink wound bed without slough.  Wound Description (Comments):   Present on Admission: Yes    Physical Exam: Vital Signs Blood pressure (!) 125/46, pulse 76, temperature 98.3 F (36.8 C), temperature source Oral, resp. rate 18, height '5\' 5"'$  (1.651 m), weight 61.9 kg, SpO2 99 %. Gen: no distress, normal appearing, BMI 22.71 HEENT: oral mucosa pink and moist, NCAT Cardio: RRR, no MRG Chest: normal effort, normal rate of breathing Abd: soft, non-distended Ext: trace LE edema b/l, left UE AVF + bruit Psych: pleasant, normal affect Musculoskeletal:     Cervical back: Normal range of motion.     Comments: Left hip tender with ROM, appears swollen. Large dressing in place. Left foot hypersensitive to touch.  Skin:    Comments:  Left hip Xeroform dressing in  place.  2 incisions left posterior lateral thigh without odor but with yellowish/tan drainage with clumpy, white debris. No odor. Wounds themselves appear pink without necrotic tissue.  Left foot: tip of great toe and 2nd/third toes appear ischemic. AVF in LUE  Stage 2 to sacrum Neurological:     Mental Status: She is alert.     Comments:    Alert and oriented x 3. Follows commands, Normal insight and awareness. Intact Memory. Normal language and speech. CN 2-12 grossly intact. UE grossly 5/5. RLE 3+ to 4/5 prox to 4+/5 distally. LLE 2/5 prox to trace/5 ankle. Stocking glove sensory loss bilateral LE, L>R up to mid calf.   Psychiatric:        Mood and Affect: Mood normal.    Flat affect      Behavior: Behavior normal.    Assessment/Plan: 1. Functional deficits which require 3+ hours per day of interdisciplinary therapy in a comprehensive inpatient rehab setting. Physiatrist is providing close team supervision and 24 hour management of active medical problems listed below. Physiatrist and rehab team continue to assess barriers to discharge/monitor patient progress toward functional  and medical goals  Care Tool:  Bathing    Body parts bathed by patient: Right arm, Left arm, Chest, Abdomen, Front perineal area, Right upper leg, Left upper leg, Right lower leg, Left lower leg, Face, Buttocks   Body parts bathed by helper: Buttocks     Bathing assist Assist Level: Contact Guard/Touching assist Assistive Device Comment: stand pivot, shower chair   Upper Body Dressing/Undressing Upper body dressing   What is the patient wearing?: Pull over shirt    Upper body assist Assist Level: Independent with assistive device Assistive Device Comment: Seated on WC  Lower Body Dressing/Undressing Lower body dressing      What is the patient wearing?: Underwear/pull up, Pants     Lower body assist Assist for lower body dressing: Minimal Assistance - Patient > 75%     Toileting Toileting     Toileting assist Assist for toileting: Maximal Assistance - Patient 25 - 49% (Steady)     Transfers Chair/bed transfer  Transfers assist     Chair/bed transfer assist level: Contact Guard/Touching assist     Locomotion Ambulation   Ambulation assist   Ambulation activity did not occur: Safety/medical concerns (pain, fatigue, weakness/deconditioning, decreased balance)  Assist level: Contact Guard/Touching assist Assistive device: Walker-rolling Max distance: 66f   Walk 10 feet activity   Assist  Walk 10 feet activity did not occur: Safety/medical concerns (pain, fatigue, weakness/deconditioning, decreased balance)  Assist level: Contact Guard/Touching assist Assistive device: Walker-rolling   Walk 50 feet activity   Assist Walk 50 feet with 2 turns activity did not occur: Safety/medical concerns (pain, fatigue, weakness/deconditioning, decreased balance)         Walk 150 feet activity   Assist Walk 150 feet activity did not occur: Safety/medical concerns (pain, fatigue, weakness/deconditioning, decreased balance)         Walk 10 feet on uneven surface  activity   Assist Walk 10 feet on uneven surfaces activity did not occur: Safety/medical concerns (pain, fatigue, weakness/deconditioning, decreased balance)         Wheelchair     Assist Is the patient using a wheelchair?: Yes Type of Wheelchair: Manual    Wheelchair assist level: Supervision/Verbal cueing Max wheelchair distance: 1559f   Wheelchair 50 feet with 2 turns activity    Assist        Assist Level: Supervision/Verbal cueing   Wheelchair 150 feet activity     Assist      Assist Level: Supervision/Verbal cueing   Blood pressure (!) 125/46, pulse 76, temperature 98.3 F (36.8 C), temperature source Oral, resp. rate 18, height '5\' 5"'$  (1.651 m), weight 61.9 kg, SpO2 99 %.    Medical Problem List and Plan: 1. Functional deficits secondary to septic  shock/metabolic with debility and encephalopathy. Pt appears cognitively to be at baseline. -  Antibiotic therapy 10/29 - 11/6             -patient may shower if left hip is covered             -ELOS/Goals: 10-12 days, mod I to supervision goals  Continue CIR, continue PT/OT  -Seen by neuropsychology  working on coping/adjustment  2.  Impaired mobility: working on sit to stands. Provided accomodation letter for ramp and bathroom. -DVT/anticoagulation:  Pharmaceutical: continue Heparin given bleeding risk             -antiplatelet therapy: Aspirin 81 mg daily and Plavix 75 mg daily 3. Left foot pain: changed Lidoderm patch to bottom of  right foot, oxycodone as needed. Provided list of foods for pain relief. Manuka honey ordered for wound healing.  4. Mood/Behavior/Sleep: Provide emotional support             -antipsychotic agents: N/A 5. Neuropsych/cognition: This patient is capable of making decisions on her own behalf. 6. Left hip incision: Continue BID to TID dressing changes to left hip. Discussed with patient that surgeon stated continued drainage is to be expected. Apply manuka honey.  -WOC follow-up for left hip drainage. -completed course of broad spectrum abx on 11/6   -Assistance provided by orthopedics. 7. Fluids/Electrolytes/Nutrition: Routine in and outs with follow-up chemistries 8.  Peripheral vascular disease.  Follow-up vascular surgery for occluded stents status post right common iliac left common iliac stenting with balloon angioplasty left common femoral and stent left SFA 11/16/2021 per Dr. [name] 9.  Tumoral calcinosis.  Recent excision of tumoral calcinosis left hip thigh at [facility] 10/20/2021.  Weightbearing as tolerated. Communicated MRI results to ortho 10.  End-stage renal disease.  Continue hemodialysis as directed. Discussed improved Creatinine to 3.08 on 11/14.   -11/23 Declined HD yesterday, she says she will agree to do this Friday  HD today 11.  Acute on  chronic anemia.  Continue iron supplement 12.  Leukocytosis.  (20k)  -Broad spectrum antibiotic therapy completed 11/16/2021 and monitor.  Latest blood culture showed no growth             -appearance of drainage idiscussed with ortho and is to be expected. Recent CT (10/30) demonstrating changes c/w tumoral calcinosis and mild cellulitis.              -MRI hip obtianed and stable              -ischemic toes LLE could also be a source             -continue wound care as above             -pt feels well otherwise and no other signs of infection             -consider H/O consult if persistent  -WBC yesterday was 21.8, recheck ordered for monday 13.  Asthma without exacerbation as well as remote history of tobacco abuse.  Continue inhalers as directed check oxygen saturations every shift 14. Diet controlled Diabetes mellitus. Blood sugar checks discontinued 15.  Hypertension.  Toprol-XL 25 mg daily.  Monitor with increased mobility -11/24 well controlled, monitor    12/04/2021    5:56 AM 12/03/2021    8:10 PM 12/03/2021    2:03 PM  Vitals with BMI  Systolic 161 096 045  Diastolic 46 45 50  Pulse 76 73 82    16.  Abrupt onset bilateral sensorineural hearing loss.  Confirmed by audiology 11/7 the symptoms have resolved.  Follow-up outpatient. 17.  Diastolic and systolic congestive heart failure.  Monitor for any signs of fluid overload. Daily weights ordered Filed Weights   12/01/21 0549 12/02/21 0500 12/03/21 0700  Weight: 64.6 kg 60.6 kg 61.9 kg  Weight overall stable, HD today  18. Pruritus: sarna lotion ordered.  19. Diarrhea: continue imodium  -11/23 Reports BM 2 days ago, order miralax prN in case constipation develops  -11/24 continue miralax prn-she would like to take medication after dialysis completed. She says she will ask for it later. Will add lactulose PRN for more severe constipation 20. Insomnia: secondary to diarrhea, continue imodium.  21. Dysuria: UA/UC  ordered -11/25 will  stop diflucan  22. Chronic nausea  -Continue Zofran PRN  -Suspect GERD may be contributing, increase PPI to BID  -11/23 a little improved Monitor response to increased PPI frequency 23. GERD  -increase pantoprazole to BID 24. Chronic polyneuropathy likely related to DM2  -She reports gabapentin in past made her confused, discussed trying different medication, she declined but says she will think about this option LOS: 11 days A FACE TO FACE EVALUATION WAS PERFORMED  Jennye Boroughs 12/04/2021, 8:26 AM

## 2021-12-04 NOTE — Progress Notes (Signed)
Occupational Therapy Session Note  Patient Details  Name: Susan Horton MRN: [id number] Date of Birth: 1970-09-19  Today's Date: 12/04/2021 OT Individual Time: 0840-1010 OT Individual Time Calculation (min): 90 min    Short Term Goals: Week 2:  OT Short Term Goal 1 (Week 2): Pt will complete shower transfer with CGA using LRAD OT Short Term Goal 2 (Week 2): Pt will complete ambulatory toilet transfer with CGA using LRAD OT Short Term Goal 3 (Week 2): Pt will tolerate greater than 5 mins in stance using LRAD during table top activity in prep for ADL needs  Skilled Therapeutic Interventions/Progress Updates:  Skilled OT intervention completed with focus on BUE endurance, cognitive sequencing and higher level alternating attention, functional transfers. Pt received seated in w/c, agreeable to session. 10/10 pain reported in bilateral legs, pt pre-medicated. Therapist offered rest breaks, modifications to mobility and therapeutic use of self to reduce pain, however it remained limited by pain and lethargy from pain meds throughout session requiring increased time for transitions.  Transported dependently in w/c <> gym due to pain and for time management.   Seated at [facility] pt completed the following to promote cognitive sequencing and visual motor reaction speed needed for BADLs and transfers: -Bells cancellation assessment- 4:12, no misses -Trail making A: 2:23, 0 errors -Trail making B: 4:24, 2 errors -Visual-motor speed reaction: 1 min to complete, 1 miss, 2.24 sec  Min A sit > stand using RW, then CGA stand pivot to EOM. Seated EOM with BLE supported with block for comfort pt completed the following exercises to promote endurance needed for BADLs and pt's heavy reliance of BUE and thoracic posture while using RW during mobility: (With red theraband) x10 reps Horizontal abduction Self-anchored shoulder flexion each arm Self-anchored bicep flexion each arm Self-anchored tricep extension  each arm Therapist anchored scapular retraction each arm Alternating chest presses Shoulder external rotation Shoulder extension Shoulder diagonal pulls -Issued an HEP for carryover of strengthening at home and in between therapy sessions.  CGA sit > stand using RW, then CGA stand pivot to w/c. Back in room pt remained seated in w/c, with chair pad alarm on/activated, and with all needs in reach at end of session.   Therapy Documentation Precautions:  Precautions Precautions: Fall Precaution Comments: L foot drop, bilateral intermittent hearing loss Restrictions Weight Bearing Restrictions: No    Therapy/Group: Individual Therapy  Blase Mess, MS, OTR/L  12/04/2021, 10:13 AM

## 2021-12-04 NOTE — Progress Notes (Signed)
Recreational Therapy Session Note  Patient Details  Name: Susan Horton MRN: [id number] Date of Birth: 1970-12-07 Today's Date: 12/04/2021  Pain: c/o B foot pain, requested seated activity PRN, premedicated. C/o nausea Skilled Therapeutic Interventions/Progress Updates: Goal:  Pt will propel w/c using BUEs with supervision.  Pt not feeling well this morning, c/o neuropathy in BLEs and nausea, had to return to the room to vomit after entering the hallway to begin therapy.  After emesis, pt agreeable to participate in session and excited about going to the gift shop.  Pt declined ambulation due to neuropathy but did propel her w/c throughout the gift shop navigating tight spaces and crowded environment with supervision.  Pt independently problem solved how to position herself at the counter to place items and pay.  Pt also propelled w/c ~50 with supervision.  Once back on the unit, pt ambulated with RW ~20' with close supervision.  Pain limited further distance.  Pt stated she enjoyed this session.  Therapy/Group: Co-Treatment   Lonita Debes 12/04/2021, 12:21 PM

## 2021-12-05 DIAGNOSIS — G9341 Metabolic encephalopathy: Secondary | ICD-10-CM | POA: Diagnosis not present

## 2021-12-05 NOTE — Plan of Care (Signed)
  Problem: Consults Goal: RH GENERAL PATIENT EDUCATION Description: See Patient Education module for education specifics. Outcome: Progressing Goal: Skin Care Protocol Initiated - if Braden Score 18 or less Description: If consults are not indicated, leave blank or document N/A Outcome: Progressing Goal: Nutrition Consult-if indicated Outcome: Progressing   Problem: RH BOWEL ELIMINATION Goal: RH STG MANAGE BOWEL WITH ASSISTANCE Description: STG Manage Bowel with min Assistance. Outcome: Progressing Goal: RH STG MANAGE BOWEL W/MEDICATION W/ASSISTANCE Description: STG Manage Bowel with Medication with min Assistance. Outcome: Progressing   Problem: RH BLADDER ELIMINATION Goal: RH STG MANAGE BLADDER WITH ASSISTANCE Description: STG Manage Bladder With min Assistance Outcome: Progressing   Problem: RH SKIN INTEGRITY Goal: RH STG SKIN FREE OF INFECTION/BREAKDOWN Description: Skin will be free of any additional breakdown/infection from the time being admitted to rehab with min assist Outcome: Progressing Goal: RH STG MAINTAIN SKIN INTEGRITY WITH ASSISTANCE Description: STG Maintain Skin Integrity With min Assistance. Outcome: Progressing Goal: RH STG ABLE TO PERFORM INCISION/WOUND CARE W/ASSISTANCE Description: STG Able To Perform Incision/Wound Care With total Assistance. Outcome: Progressing   Problem: RH SAFETY Goal: RH STG ADHERE TO SAFETY PRECAUTIONS W/ASSISTANCE/DEVICE Description: STG Adhere to Safety Precautions With cuing Assistance/Device. Outcome: Progressing   Problem: RH PAIN MANAGEMENT Goal: RH STG PAIN MANAGED AT OR BELOW PT'S PAIN GOAL Description: Pain will be managed at 4 out of 10 on pain scale with min assist using PRN medications Outcome: Progressing   Problem: RH KNOWLEDGE DEFICIT GENERAL Goal: RH STG INCREASE KNOWLEDGE OF SELF CARE AFTER HOSPITALIZATION Description: Patient/caregiver will be able to managed medications, skin/wound care, and self care  from nursing education and handouts independently  Outcome: Progressing   Problem: Fluid Volume: Goal: Hemodynamic stability will improve Outcome: Progressing   Problem: Clinical Measurements: Goal: Diagnostic test results will improve Outcome: Progressing Goal: Signs and symptoms of infection will decrease Outcome: Progressing   Problem: Respiratory: Goal: Ability to maintain adequate ventilation will improve Outcome: Progressing   Problem: Education: Goal: Understanding of CV disease, CV risk reduction, and recovery process will improve Outcome: Progressing Goal: Individualized Educational Video(s) Outcome: Progressing   Problem: Activity: Goal: Ability to return to baseline activity level will improve Outcome: Progressing   Problem: Cardiovascular: Goal: Ability to achieve and maintain adequate cardiovascular perfusion will improve Outcome: Progressing Goal: Vascular access site(s) Level 0-1 will be maintained Outcome: Progressing   Problem: Health Behavior/Discharge Planning: Goal: Ability to safely manage health-related needs after discharge will improve Outcome: Progressing   Problem: Education: Goal: Knowledge of General Education information will improve Description: Including pain rating scale, medication(s)/side effects and non-pharmacologic comfort measures Outcome: Progressing   Problem: Health Behavior/Discharge Planning: Goal: Ability to manage health-related needs will improve Outcome: Progressing

## 2021-12-05 NOTE — Progress Notes (Signed)
Pt refused am heparin injection. On call provider Durel Salts, notified.

## 2021-12-05 NOTE — Progress Notes (Addendum)
PROGRESS NOTE   Subjective/Complaints:  No acute complaints. No events overnight.   Patient refusing Heparin injections this AM; states they are causing "knots" under her skin. Educated patient on need for DVT PPX with her current mobility, and limitation in options due to renal function. She states understanding. Patient states she had Hx DVT and per records did have in 2015, unsure if provoked, unsure why not on OP AC.   L foot remains exquisitely tender.   ROS:  + L foot pain  +left hip pain. Denies fevers, chills, N/V, abdominal pain, constipation, diarrhea, SOB, cough, chest pain, new weakness or paraesthesias.    Objective:   No results found. No results for input(s): "WBC", "HGB", "HCT", "PLT" in the last 72 hours.   Recent Labs    12/03/21 0941  NA 138  K 3.7  CL 96*  CO2 24  GLUCOSE 103*  BUN 57*  CREATININE 6.64*  CALCIUM 7.6*      Intake/Output Summary (Last 24 hours) at 12/05/2021 1550 Last data filed at 12/05/2021 1317 Gross per 24 hour  Intake 474 ml  Output --  Net 474 ml      Pressure Injury 11/15/21 Sacrum Medial Stage 2 -  Partial thickness loss of dermis presenting as a shallow open injury with a red, pink wound bed without slough. (Active)  11/15/21 1030  Location: Sacrum  Location Orientation: Medial  Staging: Stage 2 -  Partial thickness loss of dermis presenting as a shallow open injury with a red, pink wound bed without slough.  Wound Description (Comments):   Present on Admission: Yes    Physical Exam: Vital Signs Blood pressure (!) 116/50, pulse 90, temperature 98.7 F (37.1 C), temperature source Oral, resp. rate 18, height '5\' 5"'$  (1.651 m), weight 61.9 kg, SpO2 97 %.  Constitution: Appropriate appearance for age. No apparnet distress  HEENT: PERRL, EOMI grossly intact.  Resp: CTAB. No rales, rhonchi, or wheezing. Cardio: RRR. No mumurs, rubs, or gallops. No peripheral edema.   Abdomen: Nondistended. Nontender. +bowel sounds. Psych: Appropriate mood and affect. Neuro: AAOx4. + Hyperalgesia LLE with decreased sensation to light touch. MSK: Moving BL UE in bed; LLE with ACE wrap in DF, mild edema at hip. No active DF, PF.    PE from prior encounter:  Gen: no distress, normal appearing  HEENT: oral mucosa pink and moist, NCAT Cardio: RRR, no MRG Chest: normal effort, normal rate of breathing Abd: soft, non-distended Ext: trace LE edema b/l, left UE AVF + bruit Psych: pleasant, normal affect Musculoskeletal:     Cervical back: Normal range of motion.     Comments: Left hip tender with ROM, appears swollen. Large dressing in place. Left foot hypersensitive to touch.  Skin:    Comments:  Left hip Xeroform dressing in place.  2 incisions left posterior lateral thigh without odor but with yellowish/tan drainage with clumpy, white debris. No odor. Wounds themselves appear pink without necrotic tissue.  Left foot: tip of great toe and 2nd/third toes appear ischemic. AVF in LUE  Stage 2 to sacrum Neurological:     Mental Status: She is alert.     Comments:    Alert  and oriented x 3. Follows commands, Normal insight and awareness. Intact Memory. Normal language and speech. CN 2-12 grossly intact. UE grossly 5/5. RLE 3+ to 4/5 prox to 4+/5 distally. LLE 2/5 prox to trace/5 ankle. Stocking glove sensory loss bilateral LE, L>R up to mid calf.   Psychiatric:        Mood and Affect: Mood normal.    Flat affect      Behavior: Behavior normal.    Assessment/Plan: 1. Functional deficits which require 3+ hours per day of interdisciplinary therapy in a comprehensive inpatient rehab setting. Physiatrist is providing close team supervision and 24 hour management of active medical problems listed below. Physiatrist and rehab team continue to assess barriers to discharge/monitor patient progress toward functional and medical goals  Care Tool:  Bathing    Body parts bathed  by patient: Right arm, Left arm, Chest, Abdomen, Front perineal area, Right upper leg, Left upper leg, Right lower leg, Left lower leg, Face, Buttocks   Body parts bathed by helper: Buttocks     Bathing assist Assist Level: Contact Guard/Touching assist Assistive Device Comment: stand pivot, shower chair   Upper Body Dressing/Undressing Upper body dressing   What is the patient wearing?: Pull over shirt    Upper body assist Assist Level: Independent with assistive device Assistive Device Comment: Seated on WC  Lower Body Dressing/Undressing Lower body dressing      What is the patient wearing?: Underwear/pull up, Pants     Lower body assist Assist for lower body dressing: Minimal Assistance - Patient > 75%     Toileting Toileting    Toileting assist Assist for toileting: Maximal Assistance - Patient 25 - 49% (Steady)     Transfers Chair/bed transfer  Transfers assist     Chair/bed transfer assist level: Supervision/Verbal cueing     Locomotion Ambulation   Ambulation assist   Ambulation activity did not occur: Safety/medical concerns (pain, fatigue, weakness/deconditioning, decreased balance)  Assist level: Contact Guard/Touching assist Assistive device: Walker-rolling Max distance: 10   Walk 10 feet activity   Assist  Walk 10 feet activity did not occur: Safety/medical concerns (pain, fatigue, weakness/deconditioning, decreased balance)  Assist level: Contact Guard/Touching assist Assistive device: Walker-rolling   Walk 50 feet activity   Assist Walk 50 feet with 2 turns activity did not occur: Safety/medical concerns (pain, fatigue, weakness/deconditioning, decreased balance)         Walk 150 feet activity   Assist Walk 150 feet activity did not occur: Safety/medical concerns (pain, fatigue, weakness/deconditioning, decreased balance)         Walk 10 feet on uneven surface  activity   Assist Walk 10 feet on uneven surfaces activity  did not occur: Safety/medical concerns (pain, fatigue, weakness/deconditioning, decreased balance)         Wheelchair     Assist Is the patient using a wheelchair?: Yes Type of Wheelchair: Manual    Wheelchair assist level: Supervision/Verbal cueing Max wheelchair distance: 180    Wheelchair 50 feet with 2 turns activity    Assist        Assist Level: Supervision/Verbal cueing   Wheelchair 150 feet activity     Assist      Assist Level: Supervision/Verbal cueing   Blood pressure (!) 116/50, pulse 90, temperature 98.7 F (37.1 C), temperature source Oral, resp. rate 18, height '5\' 5"'$  (1.651 m), weight 61.9 kg, SpO2 97 %.    Medical Problem List and Plan: 1. Functional deficits secondary to septic shock/metabolic with debility  and encephalopathy. Pt appears cognitively to be at baseline. -  Antibiotic therapy 10/29 - 11/6             -patient may shower if left hip is covered             -ELOS/Goals: 10-12 days, mod I to supervision goals  Continue CIR, continue PT/OT  -Seen by neuropsychology  working on coping/adjustment  2.  Impaired mobility: working on sit to stands. Provided accomodation letter for ramp and bathroom. -DVT/anticoagulation:  Pharmaceutical: continue Heparin given bleeding risk - intermittently refusing, educated patient on need 11/25. Does have Hx DVT 2015, ? Need for long-term AC               -antiplatelet therapy: Aspirin 81 mg daily and Plavix 75 mg daily 3. Left foot pain, Chronic periopheral polyneuropathy (likely d/t DM 2): changed Lidoderm patch to bottom of right foot, oxycodone as needed. Provided list of foods for pain relief. Manuka honey ordered for wound healing.   -She reports gabapentin in past made her confused, discussed trying different medication, she declined but says she will think about this option            - 11/25: taking PRN oxycodone approximately BID; no medication changes   4. Mood/Behavior/Sleep: Provide  emotional support             -antipsychotic agents: N/A 5. Neuropsych/cognition: This patient is capable of making decisions on her own behalf. 6. Left hip incision: Continue BID to TID dressing changes to left hip. Discussed with patient that surgeon stated continued drainage is to be expected. Apply manuka honey.  -WOC follow-up for left hip drainage. -completed course of broad spectrum abx on 11/6   -Assistance provided by orthopedics. 7. Fluids/Electrolytes/Nutrition: Routine in and outs with follow-up chemistries 8.  Peripheral vascular disease.  Follow-up vascular surgery for occluded stents status post right common iliac left common iliac stenting with balloon angioplasty left common femoral and stent left SFA 11/16/2021 per Dr. [name] 9.  Tumoral calcinosis.  Recent excision of tumoral calcinosis left hip thigh at [facility] 10/20/2021.  Weightbearing as tolerated. Communicated MRI results to ortho 10.  End-stage renal disease.  Continue hemodialysis as directed. Discussed improved Creatinine to 3.08 on 11/14.   -11/23 Declined HD yesterday, she says she will agree to do this Friday  HD today 11.  Acute on chronic anemia.  Continue iron supplement 12.  Leukocytosis.  (20k)  -Broad spectrum antibiotic therapy completed 11/16/2021 and monitor.  Latest blood culture showed no growth             -appearance of drainage idiscussed with ortho and is to be expected. Recent CT (10/30) demonstrating changes c/w tumoral calcinosis and mild cellulitis.              -MRI hip obtianed and stable              -ischemic toes LLE could also be a source             -continue wound care as above             -pt feels well otherwise and no other signs of infection             -consider H/O consult if persistent  -WBC yesterday was 21.8, recheck ordered for Monday  13.  Asthma without exacerbation as well as remote history of tobacco abuse.  Continue inhalers as directed check oxygen saturations every  shift 14. Diet controlled Diabetes mellitus. Blood sugar checks discontinued 15.  Hypertension.  Toprol-XL 25 mg daily.  Monitor with increased mobility -11/24 well controlled, monitor    12/05/2021    3:06 PM 12/05/2021   10:31 AM 12/05/2021   10:29 AM  Vitals with BMI  Systolic [phone]  Diastolic 50 50 50  Pulse 90 90 90    16.  Abrupt onset bilateral sensorineural hearing loss.  Confirmed by audiology 11/7 the symptoms have resolved.  Follow-up outpatient. 17.  Diastolic and systolic congestive heart failure.  Monitor for any signs of fluid overload. Daily weights ordered Filed Weights   12/01/21 0549 12/02/21 0500 12/03/21 0700  Weight: 64.6 kg 60.6 kg 61.9 kg  Weight overall stable, HD today  18. Pruritus: sarna lotion ordered.  19. Diarrhea: continue imodium  -11/23 Reports BM 2 days ago, order miralax prN in case constipation develops  -11/24 continue miralax prn-she would like to take medication after dialysis completed. She says she will ask for it later. Will add lactulose PRN for more severe constipation            - 11/25: Last recorded BM 11/17; took PRN miralax last night without results; per patient last BM 11/20. Ordered to give PRN lactulose tonight. +passing flatus  20. Insomnia: secondary to diarrhea, continue imodium.  21. Dysuria: UA/UC ordered -11/25 will stop diflucan   22. Chronic nausea  -Continue Zofran PRN  -Suspect GERD may be contributing, increase PPI to BID  -11/23 a little improved Monitor response to increased PPI frequency 23. GERD  -increase pantoprazole to BID   LOS: 12 days A FACE TO FACE EVALUATION WAS PERFORMED  Gertie Gowda 12/05/2021, 3:50 PM

## 2021-12-05 NOTE — Progress Notes (Signed)
[facility] Progress Note   Subjective:   HD yesterday 0.8L UF No issues today, working with PT  Objective Vitals:   12/04/21 1630 12/04/21 1645 12/04/21 1940 12/05/21 0558  BP: 124/66 (!) 126/49 (!) 98/43 (!) 115/45  Pulse: 86  99 92  Resp: '16 18 17 16  '$ Temp:   98.7 F (37.1 C) 98.8 F (37.1 C)  TempSrc:   Oral Oral  SpO2: 100% 100% 100% 99%  Weight:      Height:       Physical Exam General: Well appearing woman, NAD.  Heart: RRR; no murmur Lungs: CTAB; no rales Abdomen: soft Extremities: trace LE edema Dialysis Access: LUE AVF + bruit  Additional Objective Labs: Basic Metabolic Panel: Recent Labs  Lab 11/30/21 1546 12/03/21 0941  NA 138 138  K 3.7 3.7  CL 96* 96*  CO2 23 24  GLUCOSE 88 103*  BUN 57* 57*  CREATININE 6.48* 6.64*  CALCIUM 8.3* 7.6*  PHOS 6.0* 6.5*    Liver Function Tests: Recent Labs  Lab 11/30/21 1546 12/03/21 0941  ALBUMIN 1.9* 1.8*    CBC: Recent Labs  Lab 11/30/21 1546  WBC 21.8*  HGB 7.5*  HCT 26.6*  MCV 83.1  PLT 499*    Medications:    aspirin  81 mg Oral Daily   atorvastatin  40 mg Oral Daily   clopidogrel  75 mg Oral Q breakfast   darbepoetin (ARANESP) injection - NON-DIALYSIS  200 mcg Subcutaneous Q Wed-1800   doxercalciferol  7 mcg Intravenous Q M,W,F-HD   heparin  5,000 Units Subcutaneous Q8H   lanthanum  1,000 mg Oral Q supper   leptospermum manuka honey  1 Application Topical Daily   lidocaine  1 patch Transdermal Q24H   metoCLOPramide  10 mg Oral TID AC   metoprolol succinate  12.5 mg Oral Daily   montelukast  10 mg Oral QHS   pantoprazole  40 mg Oral BID   pneumococcal 20-valent conjugate vaccine  0.5 mL Intramuscular Tomorrow-1000   thiamine  100 mg Oral Daily    Dialysis Orders: [facility] 3h 3mn 400/600  61kg  2/2 bath P2  Hep 4000  LUE AVF - mircera 200 mcg IV q2, last 10/16, due 10/30 - venofer '100mg'$  IV tiw thru 11/06 - doxercalciferol 7 ug tiw - sod thiosulfate 25 gm iv  tiw   Assessment/Plan: Septic shock - sp course of Zosyn, now resolved -> CIR d/t debility  ESRD: Required CRRT on admit, now back to iHD MWF. On schedule next Tx 11/27 HTN/volume: BP controlled, mostly euvolemic on exam.  Anemia of ESRD: Hgb 7s. Continue max dose Aranesp 2045m weekly while here. No IV Fe with ferritin >5000 Secondary HPTH: CorrCa ok, Phos high. She has trouble tolerating any binder secondary to gastroparesis, trying Fosrenol once daily for now. Nutrition: Alb low,  continue supplements sometimes refusing Nepro Encephalopathy - D/t shock/meds?, better. Had some hearing loss and foot drop 11/7 now spontaneously improving. No offending meds Myoclonus: severe, after initial consult. Felt due to meds (cefepime, gabapentin and narcotics). Resolved.  Tumoral calcinosis L hip - long-standing painful issue for her, on NaThio empirically as op, although hasn't helped. [facility] Recultured 11/2 - NGTD. T2DM with severe gastroparesis - Ongoing issue, s/p esophageal myotomy 09/17/21. Has issues swallow/keeping pills down. L foot pain - Xray negative, uric acid normal. [facility]VS consulted, s/p LE angiography with improvement in pain Leukocytosis - persistent. Per PMD. Dispo: Tentative d/c  plan of 12/12/21  Rexene Agent, MD  12/05/2021, 9:35 AM  [facility]

## 2021-12-05 NOTE — Discharge Summary (Signed)
Physician Discharge Summary  Patient ID: Susan Horton MRN: [id number] DOB/AGE: 51-Sep-1972 51 y.o.  Admit date: 11/23/2021 Discharge date: 12/13/2021  Discharge Diagnoses:  Principal Problem:   Sepsis [facility]) Active Problems:   Chronic combined systolic and diastolic congestive heart failure (HCC)   Acute metabolic encephalopathy DVT prophylaxis Peripheral vascular disease Tumoral calcinosis End-stage renal disease Acute on chronic anemia Asthma Diet-controlled diabetes mellitus with peripheral neuropathy Hypertension GERD CAD with stenting  Discharged Condition: Stable  Significant Diagnostic Studies: VAS [name] LOWER EXTREMITY VENOUS (DVT)  Result Date: 12/07/2021  Lower Venous DVT Study Patient Name:  Susan Horton  Date of Exam:   12/07/2021 Medical Rec #: [id number]      Accession #:    [id number] Date of Birth: Apr 02, 1970       Patient Gender: F Patient Age:   9 years Exam Location:  [facility] Procedure:      VAS [name] LOWER EXTREMITY VENOUS (DVT) Referring Phys: Durel Salts --------------------------------------------------------------------------------  Indications: Pain. Other Indications: Peripheral neuropathy. Risk Factors: DVT 2015 Surgery History of bilateral iliac stents and SFA stents On Dialysis. Comparison Study: History of DVT in left leg in 2015. Performing Technologist: Oda Cogan RDMS, RVT  Examination Guidelines: A complete evaluation includes B-mode imaging, spectral Doppler, color Doppler, and power Doppler as needed of all accessible portions of each vessel. Bilateral testing is considered an integral part of a complete examination. Limited examinations for reoccurring indications may be performed as noted. The reflux portion of the exam is performed with the patient in reverse Trendelenburg.  +---------+---------------+---------+-----------+----------+--------------+ RIGHT    CompressibilityPhasicitySpontaneityPropertiesThrombus Aging  +---------+---------------+---------+-----------+----------+--------------+ CFV      Full           Yes      Yes                                 +---------+---------------+---------+-----------+----------+--------------+ SFJ      Full                                                        +---------+---------------+---------+-----------+----------+--------------+ FV Prox  Full                                                        +---------+---------------+---------+-----------+----------+--------------+ FV Mid   Full                                                        +---------+---------------+---------+-----------+----------+--------------+ FV DistalFull                                                        +---------+---------------+---------+-----------+----------+--------------+ PFV      Full                                                        +---------+---------------+---------+-----------+----------+--------------+  POP      Full           Yes      Yes                                 +---------+---------------+---------+-----------+----------+--------------+ PTV      Full                                                        +---------+---------------+---------+-----------+----------+--------------+ PERO     Full                                                        +---------+---------------+---------+-----------+----------+--------------+   +---------+---------------+---------+-----------+----------+--------------+ LEFT     CompressibilityPhasicitySpontaneityPropertiesThrombus Aging +---------+---------------+---------+-----------+----------+--------------+ CFV      Full           Yes      Yes                                 +---------+---------------+---------+-----------+----------+--------------+ SFJ      Full                                                         +---------+---------------+---------+-----------+----------+--------------+ FV Prox  Full                                                        +---------+---------------+---------+-----------+----------+--------------+ FV Mid   Full                                                        +---------+---------------+---------+-----------+----------+--------------+ FV DistalFull                                                        +---------+---------------+---------+-----------+----------+--------------+ PFV      Full                                                        +---------+---------------+---------+-----------+----------+--------------+ POP      Full           Yes      Yes                                 +---------+---------------+---------+-----------+----------+--------------+  PTV      Full                                                        +---------+---------------+---------+-----------+----------+--------------+ PERO     Full                                                        +---------+---------------+---------+-----------+----------+--------------+     Summary: BILATERAL: - No evidence of deep vein thrombosis seen in the lower extremities, bilaterally. -No evidence of popliteal cyst, bilaterally.   *See table(s) above for measurements and observations. Electronically signed by Monica Martinez MD on 12/07/2021 at 12:41:45 PM.    Final    MR HIP LEFT WO CONTRAST  Result Date: 11/25/2021 CLINICAL DATA:  Left hip pain. History of renal failure and tumoral calcinosis. EXAM: MR OF THE LEFT HIP WITHOUT CONTRAST TECHNIQUE: Multiplanar, multisequence MR imaging was performed. No intravenous contrast was administered. COMPARISON:  CT scan 11/09/2021 FINDINGS: Changes of extensive tumoral calcinosis surrounding the left hip and left femur. Surrounding diffuse body wall edema and myositis involving the hip and pelvic musculature. No discrete  subcutaneous fluid collection to suggest a drainable abscess. Both hips are normally located. No findings suspicious for septic arthritis or osteomyelitis. No stress fracture or AVN. The pubic symphysis and SI joints are intact. No findings to suggest septic arthritis. No significant intrapelvic abnormalities. IMPRESSION: 1. Changes of extensive tumoral calcinosis surrounding the left hip and left femur. 2. Diffuse body wall edema and myositis involving the hip and pelvic musculature. 3. No findings suspicious for septic arthritis or osteomyelitis. 4. No significant intrapelvic abnormalities. Electronically Signed   By: Marijo Sanes M.D.   On: 11/25/2021 08:19   CT HEAD WO CONTRAST (5MM)  Result Date: 11/19/2021 CLINICAL DATA:  51 year old female with weakness, acute neurologic deficit. EXAM: CT HEAD WITHOUT CONTRAST TECHNIQUE: Contiguous axial images were obtained from the base of the skull through the vertex without intravenous contrast. RADIATION DOSE REDUCTION: This exam was performed according to the departmental dose-optimization program which includes automated exposure control, adjustment of the mA and/or kV according to patient size and/or use of iterative reconstruction technique. COMPARISON:  Brain MRI 01/13/2019.  Head CT 11/08/2021. FINDINGS: Brain: Cerebral volume remains normal for age. No midline shift, ventriculomegaly, mass effect, evidence of mass lesion, intracranial hemorrhage or evidence of cortically based acute infarction. Gray-white matter differentiation remains largely normal for age. Subtle chronic lacunar infarct of the posterior left corona radiata, and heterogeneity in the brainstem, better demonstrated on MRI. Vascular: Calcified atherosclerosis at the skull base. No suspicious intracranial vascular hyperdensity. Skull: No acute osseous abnormality identified. Sinuses/Orbits: Visualized paranasal sinuses and mastoids are stable and well aerated. Other: Calcified scalp vessel  atherosclerosis. Leftward gaze. No other orbit or scalp soft tissue finding. IMPRESSION: 1. No acute intracranial abnormality and stable non contrast CT appearance of the brain since last month. Mild chronic small vessel disease better demonstrated by MRI. 2. Calcified scalp vessel atherosclerosis, frequently observed in end stage renal disease. Electronically Signed   By: Genevie Ann M.D.   On: 11/19/2021 08:13   PERIPHERAL VASCULAR CATHETERIZATION  Result Date: 11/16/2021  Images from the original result were not included. Patient name: EVERLEAN BUCHER MRN: [id number] DOB: 1970-04-15 Sex: female 11/16/2021 Pre-operative Diagnosis: Chonic limb threatening left lower extremity ischemia with rest pain Post-operative diagnosis:  Same Surgeon:  Eda Paschal. [name], MD Procedure Performed: 1.  Also guided cannulation right common femoral artery 2.  Stent of right common iliac artery 7 x 29 mm VBX 3.  Aortogram with left lower extremity angiogram 4.  Stent of left common iliac artery with 7 x 59 mm VBX 5.  Jetstream atherectomy left common femoral artery and SFA 6.  Drug-coated balloon angioplasty left common femoral artery with 6 x 40 mm Ranger 7.  Stent of left SFA with proximal 5 x 140 mm [place] and distal 5 x 80 mm [place] 8. Moderate sedation with fentanyl and Versed for 101 minutes Indications: 51 year old female with history of left external iliac artery and left SFA stenting for short distance claudication.  She has now been admitted with left hip infection and has severe rest pain in the left lower extremity with concern for occlusion of her stents and is indicated for angiography with possible intervention. Findings: The aorta and iliac segments were heavily calcified.  I could actually not pass the right common iliac artery without stenting first.  Aortogram demonstrated a calcified artery.  Left common femoral artery was subtotally occluded at least 95% stenosed and there was a 90% stenosis in the proximal SFA.   The profunda was patent.  The existing SFA stent existing external iliac artery stents were patent.  The left SFA distal to the stents was diseased with subtotal occlusion at least 95% stenosis.  There was single-vessel runoff via the posterior tibial artery which did fill the foot.  After atherectomy and stenting and drug-coated balloon angioplasty there was brisk flow through the common femoral artery and SFA down to the posterior tibial artery.  There was still common iliac artery lesion on the left this was primarily stented and the distal pressure did improve 40 mmHg at the common femoral artery after stenting.  There was a very strong posterior tibial signal at completion.  Procedure:  The patient was identified in the holding area and taken to room 8.  The patient was then placed supine on the table and prepped and draped in the usual sterile fashion.  A time out was called.  Ultrasound was used to evaluate the right common femoral artery which was heavily diseased.  The area was anesthetized 1% lidocaine cannulated by puncture needle followed by wire and sheath.  Ultrasound guidance was used and images saved the permanent record.  Concomitantly we administered fentanyl and Versed and her vital signs were monitored throughout the case.  A Bentson wire was placed followed by 5 [place] sheath.  Of Berenstein catheter was used to cross the high-grade stenosis in the common iliac artery on the right and this was primarily stented with a 7 x 29 mm VBX.  Aortogram was then performed.  I did cross the bifurcation using Glidewire advantage and crossover catheter and then placed a long 7 French sheath over the bifurcation had to perform a left lower extremity angiography and the patient was fully heparinized prior to placing a 7 x 29 mm VBX.  We then were able to cross all of the occluded common femoral artery and stenosis in the SFA all the way down to the popliteal artery perform distal angiogram which demonstrated  runoff via the posterior tibial to the foot.  I placed an 014 wire followed by a Nava 6 filter.  We then performed jetstream atherectomy the common femoral artery and proximal SFA followed by balloon angioplasty of the common femoral artery with 5 and 6 mm balloons followed by drug-coated balloon angioplasty of the common femoral artery.  The filter was then removed and I exchanged for a separate 014 wire that was placed distally.  We then primarily stented the distal SFA with a 5 x 80 mm drug-eluting stent followed by 5 x 140 mm drug-eluting stent proximally all ballooned with 5 mm balloon.  Completion demonstrated brisk runoff via the SFA and the common femoral was patent.  At that time we had a systolic pressure of 75 and the sheath at the common femoral artery.  I elected to primarily stent the common iliac artery and this was primarily stented with a 7 x 59 mm VBX without incidence.  Completion demonstrated an improvement in systolic pressure at the common femoral artery to 115 with only a 15 mm gradient from the aorta at this time.  We then exchanged over the Amplatz wire for a short 7 French sheath and removed the Amplatz with quick cross catheter.  Retrograde imaging was performed of the right common femoral artery there was no active extravasation this will be pulled postoperative holding.  Patient tolerated procedure without any complication. Contrast: 130cc Brandon C. [name], MD [facility] Office: [phone] Pager: [phone]   DG Ankle 2 Views Left  Result Date: 11/13/2021 CLINICAL DATA:  Pain EXAM: LEFT ANKLE - 2 VIEW COMPARISON:  None Available. FINDINGS: No fracture or dislocation is seen. No focal lytic lesions are seen. Extensive arterial calcifications are seen in soft tissues. IMPRESSION: No fracture or dislocation is seen.  Arteriosclerosis. Electronically Signed   By: Elmer Picker M.D.   On: 11/13/2021 18:39   ECHOCARDIOGRAM COMPLETE  Result  Date: 11/09/2021    ECHOCARDIOGRAM REPORT   Patient Name:   ANEYA DADDONA Date of Exam: 11/09/2021 Medical Rec #:  [id number]     Height:       65.0 in Accession #:    [id number]    Weight:       132.0 lb Date of Birth:  July 31, 1970      BSA:          1.658 m Patient Age:    59 years      BP:           143/53 mmHg Patient Gender: F             HR:           106 bpm. Exam Location:  Inpatient Procedure: 2D Echo, Cardiac Doppler and Color Doppler Indications:    Endocarditis  History:        Patient has prior history of Echocardiogram examinations, most                 recent 07/02/2019. CAD; Risk Factors:Hypertension and Diabetes.  Sonographer:    Memory Argue Referring Phys: [place]  Sonographer Comments: Suboptimal subcostal window. IMPRESSIONS  1. Left ventricular ejection fraction, by estimation, is 45 to 50%. The left ventricle has mildly decreased function. The left ventricle demonstrates global hypokinesis. There is mild left ventricular hypertrophy. Left ventricular diastolic parameters are indeterminate.  2. Right ventricular systolic function is moderately reduced. The right ventricular size is moderately enlarged. There is mildly elevated pulmonary artery systolic pressure. The estimated right ventricular systolic pressure  is 43.3 mmHg.  3. Tricuspid valve regurgitation is severe.  4. Left atrial size was mild to moderately dilated.  5. The mitral valve is degenerative. Trivial mitral valve regurgitation. No evidence of mitral stenosis. Severe mitral annular calcification.  6. The aortic valve is abnormal, tricuspid with moderately reduced cusp excursion. There is moderate calcification of the aortic valve. Aortic valve regurgitation is not visualized. Aortic valve sclerosis/calcification is present, without any evidence of aortic stenosis. Aortic valve mean gradient measures 7.0 mmHg. Stroke volume index is low, 30 mL/m2, suggesting aortic valve gradients may be underestimated.  7. The inferior  vena cava is dilated in size with <50% respiratory variability, suggesting right atrial pressure of 15 mmHg. Conclusion(s)/Recommendation(s): No evidence of valvular vegetations on this transthoracic echocardiogram. Consider a transesophageal echocardiogram to exclude infective endocarditis if clinically indicated. FINDINGS  Left Ventricle: Left ventricular ejection fraction, by estimation, is 45 to 50%. The left ventricle has mildly decreased function. The left ventricle demonstrates global hypokinesis. The left ventricular internal cavity size was normal in size. There is  mild left ventricular hypertrophy. Left ventricular diastolic parameters are indeterminate. Right Ventricle: The right ventricular size is moderately enlarged. No increase in right ventricular wall thickness. Right ventricular systolic function is moderately reduced. There is mildly elevated pulmonary artery systolic pressure. The tricuspid regurgitant velocity is 2.66 m/s, and with an assumed right atrial pressure of 15 mmHg, the estimated right ventricular systolic pressure is 33.2 mmHg. Left Atrium: Left atrial size was mild to moderately dilated. Right Atrium: Right atrial size was normal in size. Pericardium: There is no evidence of pericardial effusion. Mitral Valve: The mitral valve is degenerative in appearance. There is moderate calcification of the mitral valve leaflet(s). Severe mitral annular calcification. Trivial mitral valve regurgitation. No evidence of mitral valve stenosis. Tricuspid Valve: The tricuspid valve is normal in structure. Tricuspid valve regurgitation is severe. No evidence of tricuspid stenosis. Aortic Valve: The aortic valve is abnormal. There is moderate calcification of the aortic valve. Aortic valve regurgitation is not visualized. Aortic valve sclerosis/calcification is present, without any evidence of aortic stenosis. Aortic valve mean gradient measures 7.0 mmHg. Aortic valve peak gradient measures 13.0 mmHg.  Aortic valve area, by VTI measures 1.54 cm. Pulmonic Valve: The pulmonic valve was not well visualized. Pulmonic valve regurgitation is not visualized. No evidence of pulmonic stenosis. Aorta: The aortic root is normal in size and structure. Venous: The inferior vena cava is dilated in size with less than 50% respiratory variability, suggesting right atrial pressure of 15 mmHg. IAS/Shunts: The interatrial septum was not well visualized.  LEFT VENTRICLE PLAX 2D LVIDd:         4.20 cm      Diastology LVIDs:         3.30 cm      LV e' medial:    7.62 cm/s LV PW:         1.00 cm      LV E/e' medial:  18.4 LV IVS:        1.00 cm      LV e' lateral:   16.20 cm/s LVOT diam:     2.00 cm      LV E/e' lateral: 8.6 LV SV:         49 LV SV Index:   30 LVOT Area:     3.14 cm  LV Volumes (MOD) LV vol d, MOD A2C: 148.0 ml LV vol d, MOD A4C: 118.0 ml LV vol s, MOD A2C: 76.1 ml  LV vol s, MOD A4C: 63.9 ml LV SV MOD A2C:     71.9 ml LV SV MOD A4C:     118.0 ml LV SV MOD BP:      61.9 ml RIGHT VENTRICLE TAPSE (M-mode): 1.8 cm LEFT ATRIUM             Index        RIGHT ATRIUM           Index LA diam:        4.30 cm 2.59 cm/m   RA Area:     15.50 cm LA Vol (A2C):   73.9 ml 44.57 ml/m  RA Volume:   37.95 ml  22.89 ml/m LA Vol (A4C):   60.2 ml 36.31 ml/m LA Biplane Vol: 67.3 ml 40.59 ml/m  AORTIC VALVE AV Area (Vmax):    1.58 cm AV Area (Vmean):   1.49 cm AV Area (VTI):     1.54 cm AV Vmax:           180.00 cm/s AV Vmean:          124.000 cm/s AV VTI:            0.321 m AV Peak Grad:      13.0 mmHg AV Mean Grad:      7.0 mmHg LVOT Vmax:         90.30 cm/s LVOT Vmean:        58.700 cm/s LVOT VTI:          0.157 m LVOT/AV VTI ratio: 0.49  AORTA Ao Root diam: 3.40 cm Ao Asc diam:  2.90 cm MITRAL VALVE                TRICUSPID VALVE MV Area (PHT): 4.96 cm     TR Peak grad:   28.3 mmHg MV Decel Time: 153 msec     TR Vmax:        266.00 cm/s MV E velocity: 140.00 cm/s MV A velocity: 134.00 cm/s  SHUNTS MV E/A ratio:  1.04          Systemic VTI:  0.16 m                             Systemic Diam: 2.00 cm Cherlynn Kaiser MD Electronically signed by Cherlynn Kaiser MD Signature Date/Time: 11/09/2021/3:14:03 PM    Final    CT Angio Abd/Pel w/ and/or w/o  Result Date: 11/09/2021 CLINICAL DATA:  51 year old female with concern for retroperitoneal hemorrhage. EXAM: CT ANGIOGRAPHY ABDOMEN AND PELVIS WITH CONTRAST AND WITHOUT CONTRAST TECHNIQUE: Multidetector CT imaging of the abdomen and pelvis was performed using the standard protocol during bolus administration of intravenous contrast. Multiplanar reconstructed images and MIPs were obtained and reviewed to evaluate the vascular anatomy. RADIATION DOSE REDUCTION: This exam was performed according to the departmental dose-optimization program which includes automated exposure control, adjustment of the mA and/or kV according to patient size and/or use of iterative reconstruction technique. CONTRAST:  163m OMNIPAQUE IOHEXOL 350 MG/ML SOLN COMPARISON:  CT abdomen pelvis from 06/29/2020 FINDINGS: VASCULAR Aorta: Normal caliber and patent throughout. Diffuse circumferential atherosclerotic calcifications, most prominent in the infrarenal portion. No evidence of definite flow limiting stenosis. Celiac: Mild ostial stenosis secondary to atherosclerotic plaque. Patent distally. SMA: Severe focal ostial stenosis secondary to atherosclerotic plaque. Patent distally. Renals: Single bilateral renal arteries with moderate ostial stenosis bilaterally secondary to atherosclerotic plaques. Patent distally with severe atherosclerotic calcifications throughout the  visualized renal arteries. IMA: Occluded proximally with distal reconstitution via arc of [place]. Inflow: Severe diffuse circumferential atherosclerotic calcifications bilaterally without multifocal at least mild stenoses. The indwelling left external iliac artery stent appears patent. Proximal Outflow: Bilateral common femoral and visualized portions  of the superficial and profunda femoral arteries are patent without evidence of aneurysm, dissection, vasculitis or significant stenosis. Veins: The hepatic veins are not well opacified due to contrast phase. The portal system is widely patent and normal in caliber. The renal veins are patent bilaterally in standard anatomic configuration. No evidence of iliocaval thrombosis or anomaly. Review of the MIP images confirms the above findings. NON-VASCULAR Lower chest: Similar appearing moderate global cardiomegaly. Hepatobiliary: No focal liver abnormality is seen. Status post cholecystectomy. No biliary dilatation. Pancreas: Unremarkable. No pancreatic ductal dilatation or surrounding inflammatory changes. Spleen: Normal in size without focal abnormality. Adrenals/Urinary Tract: Adrenal glands are unremarkable. Mild diffuse perinephric fat stranding bilaterally, similar to 2022 comparison. No hydronephrosis. No focal masses. Mild diffuse bladder wall thickening. Bladder is unremarkable. Stomach/Bowel: Stomach is within normal limits. Appendix is not definitively identified. No evidence of bowel wall thickening, distention, or inflammatory changes. Lymphatic: No abdominopelvic lymphadenopathy. Reproductive: Status post hysterectomy. No adnexal masses. Other: Small volume pelvic ascites.  No abdominal wall hernia. Musculoskeletal: Partially visualized intramuscular and subcutaneous calcifications about the left hip, better characterized on dedicated left hip CT from the same day. No evidence of acute osseous abnormality. IMPRESSION: VASCULAR 1. No evidence of acute vascular abnormality including retroperitoneal hemorrhage, as queried. 2. 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 Similar findings could be seen with chronic mesenteric ischemia. 3.  Patent indwelling left external iliac artery stent. NON-VASCULAR 1. Diffuse perinephric fat stranding. As these findings could be a sign of pyelonephritis, there stable since comparison from 2022. 2. Small volume pelvic ascites. 3. Similar appearing, partially visualized left thigh tumor calcinosis, better characterized on dedicated left hip CT from the same day. Ruthann Cancer, MD [facility] Electronically Signed   By: Ruthann Cancer M.D.   On: 11/09/2021 10:46   CT HIP LEFT W CONTRAST  Result Date: 11/09/2021 CLINICAL DATA:  Left hip infection.  Status post surgery. EXAM: CT OF THE LOWER LEFT EXTREMITY WITH CONTRAST TECHNIQUE: Multidetector CT imaging of the lower left extremity was performed according to the standard protocol following intravenous contrast administration. RADIATION DOSE REDUCTION: This exam was performed according to the departmental dose-optimization program which includes automated exposure control, adjustment of the mA and/or kV according to patient size and/or use of iterative reconstruction technique. CONTRAST:  165m OMNIPAQUE IOHEXOL 350 MG/ML SOLN COMPARISON:  MR left hip 08/27/2020 FINDINGS: Bones/Joint/Cartilage No fracture or dislocation. Normal alignment. No joint effusion. Ligaments Ligaments are suboptimally evaluated by CT. 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 Soft tissue No fluid collection or hematoma. No soft tissue mass. Soft tissue edema in the subcutaneous fat along the proximal lateral hip and thigh which may reflect mild cellulitis. Diffuse arterial atherosclerosis. Small amount of pelvic free fluid. IMPRESSION: 1. Extensive calcifications with areas layering hyperdense sediment involving the proximal  extensor, gluteal and flexor compartment muscles of the left thigh with the overall appearance most consistent with tumoral calcinosis. 2. Soft  tissue edema in the subcutaneous fat along the proximal lateral hip and thigh which may reflect mild cellulitis. Electronically Signed   By: Kathreen Devoid M.D.   On: 11/09/2021 10:45   DG Chest Portable 1 View  Result Date: 11/08/2021 CLINICAL DATA:  Central line placement. EXAM: PORTABLE CHEST 1 VIEW COMPARISON:  Earlier same day FINDINGS: 1251 hours. Low volume film. The cardio pericardial silhouette is enlarged. Diffuse bilateral hazy airspace opacity suggests edema. Right IJ central line tip overlies the upper right atrium. No evidence for pneumothorax. Telemetry leads overlie the chest. IMPRESSION: Right IJ central line tip overlies the upper right atrium. No evidence for pneumothorax. Cardiomegaly with pulmonary edema. Electronically Signed   By: Misty Stanley M.D.   On: 11/08/2021 13:13   CT Head Wo Contrast  Result Date: 11/08/2021 CLINICAL DATA:  Head trauma.  Abnormal mental status. EXAM: CT HEAD WITHOUT CONTRAST TECHNIQUE: Contiguous axial images were obtained from the base of the skull through the vertex without intravenous contrast. RADIATION DOSE REDUCTION: This exam was performed according to the departmental dose-optimization program which includes automated exposure control, adjustment of the mA and/or kV according to patient size and/or use of iterative reconstruction technique. COMPARISON:  None Available. FINDINGS: Brain: No evidence of acute infarction, hemorrhage, hydrocephalus, extra-axial collection or mass lesion/mass effect. Vascular: No hyperdense vessel or unexpected calcification. Skull: Normal. Negative for fracture or focal lesion. Sinuses/Orbits: No acute finding. Other: None. IMPRESSION: No acute intracranial abnormalities. Electronically Signed   By: Dorise Bullion III M.D.   On: 11/08/2021 11:54   DG Hip Unilat W or Wo Pelvis  2-3 Views Left  Result Date: 11/08/2021 CLINICAL DATA:  Fall/recent surgery. EXAM: DG HIP (WITH OR WITHOUT PELVIS) 2-3V LEFT COMPARISON:  July 20, 2021 FINDINGS: A vascular stent is identified in the proximal femoral artery. A vascular stent is identified in the left iliac artery. Calcified atherosclerotic changes are identified in the pelvic and femoral vessels. The lower lumbar spine, sacrum, and pelvic bones are intact. The right proximal femur is grossly intact. No left hip fracture is noted. No dislocation. The large area of soft tissue calcification previously described as tumoral calcinosis measures 21 by 15 cm today versus 21 x 14 cm on the comparison study. The small difference in measurement could be technical. IMPRESSION: 1. No fracture or dislocation identified. 2. The large area of soft tissue calcification previously described as tumoral calcinosis measures 21 x 15 cm today versus 21 x 14 cm on the comparison study. The small difference in measurement could be technical. 3. Vascular stents as above. 4. Calcified atherosclerotic changes. Electronically Signed   By: Dorise Bullion III M.D.   On: 11/08/2021 11:28   DG Chest 2 View  Result Date: 11/08/2021 CLINICAL DATA:  Fever EXAM: CHEST - 2 VIEW COMPARISON:  None Available. FINDINGS: Persistent cardiomegaly, mildly more prominent compared to the June 29, 2020. The hila and mediastinum are normal. Vague hazy opacity in the right mid and lower lung. No pneumothorax. No other acute abnormalities or changes. IMPRESSION: 1. Cardiomegaly. 2. Vague hazy opacity in the right mid and lower lung is worrisome for early developing infiltrate/pneumonia given history of fever. Recommend short-term follow-up imaging to ensure resolution. 3. No other acute abnormalities. Electronically Signed   By: Dorise Bullion III M.D.   On: 11/08/2021 11:24    Labs:  Basic Metabolic Panel: Recent Labs  Lab 12/03/21 0941  NA 138  K 3.7  CL 96*  CO2 24  GLUCOSE  103*  BUN 57*  CREATININE 6.64*  CALCIUM 7.6*  PHOS 6.5*    CBC: Recent Labs  Lab 12/07/21 0552  WBC 17.3*  NEUTROABS 14.7*  HGB 7.0*  HCT 23.7*  MCV 79.3*  PLT 365    CBG: No results for input(s): "GLUCAP" in the last 168 hours.  Family history.  Father with hypertension Sister with hypertension.  Cousin with breast cancer.  Denies any colon cancer esophageal cancer or rectal cancer  Brief HPI:   Susan Horton is a 51 y.o. right-handed female with history of end-stage renal disease with hemodialysis, CAD with stenting, hypertension, diastolic and systolic congestive heart failure, diabetes mellitus with peripheral neuropathy, lower extremity DVT, asthma/quit smoking 5 years ago, peripheral vascular disease with history of left external iliac artery and left SFA stenting.  She underwent recent excision of tumoral calcinosis left hip/thigh, drain and sutures removed 10/20/2021 without evidence of infection per office notes at [facility].  Per chart review uses a straight cane prior to admission.  Plans to discharge home with her sister.  Presented 11/08/2021 with progressive weakness and fall since 11/06/2021.  On arrival she was found to be febrile as well as tachycardic with mild altered mental status.  She was admitted to the ICU for possible sepsis placed on broad-spectrum antibiotics 11/08/2021 - 11/16/2021.  Cranial scan of the head negative.  Chest x-ray showed vague hazy opacity right mid and lower lung worrisome for early developing infiltrate/pneumonia.  She did have some noted drainage from the left hip.  X-rays unilateral with and without contrast of pelvis showed no fracture or dislocation identified.  There was a large area of soft tissue calcification previously described as tumoral calcinosis measuring 21 x 15 cm versus 21 x 14 on the comparison study.  CT of the left hip showed extensive calcification with areas layering hyperdense sediment involving the proximal  extensor, gluteal and flexor compartment muscles of the left thigh with overall appearance most consistent with tumoral calcinosis.  Soft tissue edema in the subcutaneous fat along the proximal lateral hip and thigh reflecting mild cellulitis.  Admission chemistries unremarkable except potassium 6.9 BUN 117 creatinine 10.68 WBC 12,800 hemoglobin 8.8 SARS coronavirus negative, lactic acid 2.0-2.5, procalcitonin 9.57.  Echocardiogram with ejection fraction of 45 to 50% right ventricular systolic function moderately reduced.  Nephrology consulted with hemodialysis ongoing.  Vascular surgery consulted 11/14/2021 due to left foot tenderness and known peripheral arterial disease and concern for occlusion of her stents.  Aortogram completed undergoing stent of right common iliac artery as well as left common iliac artery with drug-coated balloon angioplasty left common femoral artery and stent of left SFA 11/16/2021 per Dr. [name].  Patient continued to have leukocytosis with all antibiotic treatment completed 11/08/2021 - 11/16/2021 and latest WBC 20,200.  Initial concern for possible bacteremia in patient with vascular access on hemodialysis.  Cultures done after antibiotics showing no growth to date and patient remained afebrile.  She remained on Plavix and aspirin as prior to admission.  Subcutaneous heparin for DVT prophylaxis.  Patient with abrupt onset bilateral sensorineural hearing loss confirmed by audiology 11/7 though symptoms have improved and recommendations follow-up outpatient.  Therapy evaluations completed due to patient decreased functional mobility was admitted for a comprehensive rehab program.   Hospital Course: Susan Horton was admitted to rehab 11/23/2021 for inpatient therapies to consist of PT, ST and OT at least three hours five days a week. Past admission physiatrist, therapy team and rehab RN have worked  together to provide customized collaborative inpatient rehab.  Pertaining to patient's  septic shock/metabolic encephalopathy with debility.  Cognition appears close to baseline.  Antibiotic therapy 10/29-11 6.  Subcutaneous heparin for DVT prophylaxis no bleeding episodes.  She remained on aspirin and Plavix as prior to admission for history of CAD with stenting as well as peripheral vascular disease.  Left hip incision dressing changes as advised.  [facility] noted patient still having some drainage which was expected with wound care nurse follow-up.  In regards to patient's peripheral vascular disease occluded stents status post right common iliac left common iliac stenting balloon angioplasty left common femoral and left SFA 11/16/2021 follow-up vascular surgery.  Tumoral calcinosis recent excision of tumoral calcinosis left hip thigh at [facility] 10/20/2021 weightbearing as tolerated communicated MRI results to Ortho.  End-stage renal disease with hemodialysis as directed.  Acute on chronic anemia no bleeding episodes.  Leukocytosis broad-spectrum antibiotics completed latest blood cultures no growth MRI of hip obtained and remained stable WBC improved 21,000 and remained afebrile.  Patient did have a history of asthma without exacerbation as well as remote tobacco abuse oxygen saturations maintained.  Diet-controlled diabetes mellitus blood sugars have since been discontinued.  Blood pressure controlled on Toprol.  She exhibited no other signs of fluid overload.  She did have some nausea/GERD remained on Protonix twice daily.  Consultation follow-up with vascular surgery for ischemia of her foot with duplex ultrasound showing multiple areas of 50 to 74% stenosis bilaterally.  She underwent angiography to confirm blood flow was optimized 12/31/2021 and was discharged to acute care services for ongoing monitoring.   Blood pressures were monitored on TID basis and soft and monitored  Diabetes has been monitored with ac/hs CBG checks and SSI was use prn for tighter BS  control.    Rehab course: During patient's stay in rehab weekly team conferences were held to monitor patient's progress, set goals and discuss barriers to discharge. At admission, patient required minimal assist squat pivot transfers +2 physical assist sit to stand  Physical exam.  Blood pressure 120/49 pulse 80 temperature 98.1 respirations 19 oxygen saturation 98% room air Constitutional.  No acute distress HEENT Head.  Normocephalic and atraumatic Eyes.  Pupils round and reactive to light no discharge without nystagmus Neck.  Supple nontender no JVD without thyromegaly Cardiac regular rate and rhythm without any extra sounds or murmur heard Abdomen.  Soft nontender positive bowel sounds without rebound Respiratory effort normal no respiratory distress without wheeze Musculoskeletal Comments.  Left hip tender with range of motion large dressing in place.  Left foot hypersensitive to touch Skin.  Left hip Xeroform dressing in place 2 incisions left posterior lateral thigh without odor but with yellowish-tan drainage with clumping white debris's.  No odor.  Wound themselves appear pink without necrotic tissue Neurologic.  Alert oriented x 3.  Normal insight and awareness.  Intact memory.  Normal language and speech. Upper extremities grossly 5/5 right lower extremity 3+ to 4/5 proximal to 4+/5 distally.  Left lower extremity 2/5 proximal to trace/5 ankle.  Stocking glove sensory loss bilateral lower extremities left greater than right      Media Information   Document Information  Photos    12/09/2021 14:13  Attached To:  Hospital Encounter on 11/23/21  Source Information  Ulyses Amor, PA-C  [id number]   Media Information   Document Information  Photos  Sacral wound  12/10/2021 11:03  Attached To:  Hospital Encounter on  11/23/21  Source Information  Merwyn Katos, RN  [id number]   Media Information   Document Information  Photos     11/24/2021 00:08  Attached To:  Hospital Encounter on 11/23/21  Source Information  BAntoine Primas RN  [id number]    He/She  has had improvement in activity tolerance, balance, postural control as well as ability to compensate for deficits. He/She has had improvement in functional use RUE/LUE  and RLE/LLE as well as improvement in awareness.  Supine to sit with supervision.  Sit to stand contact-guard.  Ambulates on level tile with contact-guard.  Wheelchair propulsion supervision.  ADLs minimal assist sit to stand using rolling walker then contact-guard stand pivot to edge of bed.  Completed upper body bathing grooming and oral care at sink seated in wheelchair supervision.  Completed lower body dressing standing at sink.  Full family teaching completed plan discharge to home.       Disposition: Discharged home    Diet: Renal diet  Special Instructions: No driving smoking or alcohol  Wound care to left hip incision.  Cleanse with normal saline, pat dry, cover with folded layer of Xeroform gauze, top with dry gauze and secure with silicone foam.  Change daily  Medihoney application applied to all her wounds daily.  Apply thin layer to wound  Patient should follow-up [facility] Dr. [facility]Magda [phone] hemod[facility] as directed.  Medications at discharge. 1.  Tylenol as needed 2.  Aspirin 81 mg p.o. daily 3.  Lipitor 40 mg p.o. daily 4.  Plavix 75 mg p.o. daily 5.  Aranesp 200 mcg every Wednesday with hemodialysis 6.  Hectorol 7 mcg Monday Wednesday Friday hemodialysis 7.  Breo Ellipta 1 puff daily as needed 8.  Fosrenol; 1000 mcg p.o. daily with supper 9.  Medihoney applied to all wounds daily apply thin layer to wound. 10.  Lidoderm patch changes directed 11.  Melatonin 3 mg nightly as needed sleep 12.  Reglan 10 mg p.o. 3 times daily 13.  Toprol-XL 12.5 mg p.o. daily 14.  Singulair 10 mg p.o.  nightly 15.  Oxycodone 10 to 15 mg every 4 hours as needed pain 16.  Protonix 40 mg p.o. twice daily 17.  Thiamine 100 mg p.o. daily  30-35 minutes were spent completing discharge summary and discharge planning  Discharge Instructions     Ambulatory referral to Physical Medicine Rehab   Complete by: As directed    Moderate complexity follow-up 1 to 2 weeks sepsis/metabolic encephalopathy        Follow-up Information     Raulkar, KrClide DeutscherMD Follow up.   Specialty: Physical Medicine and Rehabilitation Why: Office to call for appointment Contact information: [phone]         CaWaynetta SandyMD Follow up.   Specialties: Vascular Surgery, Cardiology Why: Call for appointment Contact information: [phone]         BeLorretta HarpMD Follow up.   Specialties: Cardiology, Radiology Why: Call for appointment Contact information: [phone]         KrJustin MendMD Follow up.   Specialty: Internal Medicine Why: Call for appointment Contact information: [phone]         WiMagda BernheimMD Follow up.  Specialty: Orthopedic Surgery Why: Call for appointment Contact information: [place] [address] [phone]         Maczis, Carlena Hurl, PA-C Follow up.   Specialty: General Surgery Why: follow up for sacral wound care Contact information: [place] [address] [phone]                 Signed: Cathlyn Parsons 12/08/2021, 9:20 AM

## 2021-12-05 NOTE — Progress Notes (Signed)
Physical Therapy Session Note  Patient Details  Name: Susan Horton MRN: [id number] Date of Birth: 1970/09/17  Today's Date: 12/05/2021 PT Individual Time: 0907-1005 PT Individual Time Calculation (min): 58 min   Short Term Goals:  Week 2:  PT Short Term Goal 1 (Week 2): pt will transfer sit<>stand with LRAD and consistant SUP PT Short Term Goal 2 (Week 2): pt will transfer bed<>chair with LRAD and consistant SUP PT Short Term Goal 3 (Week 2): pt will ambulate 42f with LRAD and SUP PT Short Term Goal 4 (Week 2): Pt will initiate stair training.  Skilled Therapeutic Interventions/Progress Updates:  Pt received asleep in bed.  She awakened easily.  She reported bil foot pain 9.5/10, premedicated.  PT gave pt numerous rests and taught diaphragmatic breathing for pain control.  Supine> sit with supervision, HOB raised.  Sitting EOB, pt performed Therapeutic exercises with LEs to increase strength for functional mobility. 10 x 1 R/L hip flexion, R/L long arc quad knee extensions with ankle pumps at end range, bil scapular retraction.  AA movements LLE PRN for small excursion ankle pumps.  PT applied ACE wrap to LLE for gait training, due to foot drop.  Sit> stand from raised bed required 2 tries due to foot pain, with CGA.  Gait training on level tile x 10' with CGA.; distance limited by L foot pain.    Wc propulsion using bil UEs without bil ELRS, so that pt had to activate bil quadriceps to clear feet, x 180' with multiple turns, close su\pervision.  VCs for efficiency and technique for turns.  At conclusion of session, pt seated in wc iwht seat pad alarm set , bil ELRs elevated, and needs at hand.         Therapy Documentation Precautions:  Precautions Precautions: Fall Precaution Comments: L foot drop, bilateral intermittent hearing loss Restrictions Weight Bearing Restrictions: No     Therapy/Group: Individual Therapy  Marranda Arakelian 12/05/2021, 10:13 AM

## 2021-12-06 NOTE — Progress Notes (Signed)
Occupational Therapy Session Note  Patient Details  Name: Susan Horton MRN: [id number] Date of Birth: 11/13/1970  Today's Date: 12/06/2021 OT Individual Time: 1035-1120 & 1545-1630 OT Individual Time Calculation (min): 45 min & 45 min   Short Term Goals: Week 2:  OT Short Term Goal 1 (Week 2): Pt will complete shower transfer with CGA using LRAD OT Short Term Goal 2 (Week 2): Pt will complete ambulatory toilet transfer with CGA using LRAD OT Short Term Goal 3 (Week 2): Pt will tolerate greater than 5 mins in stance using LRAD during table top activity in prep for ADL needs  Skilled Therapeutic Interventions/Progress Updates:  Session 1:   Upon OT arrival, pt semi recumbent in bed and RN present for dressing change. Pt reports increased pain in the LE at 10/10. States "I cried and I'm not one to cry much". Educated on medication compliance for DVT prevention r/t mobility status. Pt verbalizes understanding. Pt unable to tolerate wearing compression stocking on R LE. Pt transitions to EOB with SBA and increased time and sits EOB to complete B UE exercises using 3lb dowel performing 1x10 reps of shoulder flex/ext, overhead press, chest press, ext/int rotation, forward rows, backward rows, and elbow flex/ext. Pt returns to supine with Min A for LE management and was left in bed at end of session with all needs met and safety measures in place.   Session 2: Upon OT arrival, pt semi recumbent in bed with parents at bedside. Pt states "I'm waiting to get an xray for my legs". Therapist regards chart, collaborates with RN and PT regarding pt's comment and per RN is ok to continue therapy services refraining from LE exercises until ultrasound is complete. Pt agreeable to OT treatment. Pt transitions to EOB with significant increased time and Min A. While seated EOB, pt completes dynamic sitting balance reaching in all planes using B UE to touch therapist hand. Pt crosses midline while performing task and  completes 3x10 reps with short rest breaks in between sets. Pt completes scooting towards HOB performing tricep flex/ext to complete with SBA and increased effort. Pt performs sit to supine transfer with Min A with LE positioned on pillows and all needs met at end of session. Safety alarm in place.  Therapy Documentation Precautions:  Precautions Precautions: Fall Precaution Comments: L foot drop, bilateral intermittent hearing loss Restrictions Weight Bearing Restrictions: No   Therapy/Group: Individual Therapy  Indi Willhite 12/06/2021, 11:07 AM

## 2021-12-06 NOTE — Progress Notes (Addendum)
Physical Therapy Session Note  Patient Details  Name: Susan Horton MRN: [id number] Date of Birth: 1970-05-06  Today's Date: 12/06/2021 PT Individual Time: 0800-0906 (late entry on 12/09/2021) 1300-1345 PT Individual Time Calculation (min): 45 min , 45 min  Short Term Goals: Week 1:  PT Short Term Goal 1 (Week 1): pt will transfer sit<>stand with LRAD and mod A consistantly PT Short Term Goal 1 - Progress (Week 1): Met PT Short Term Goal 2 (Week 1): pt will transfer bed<>chair with LRAD and min A PT Short Term Goal 2 - Progress (Week 1): Met PT Short Term Goal 3 (Week 1): pt will ambulate 9f with LRAD and mod A PT Short Term Goal 3 - Progress (Week 1): Met Week 2:  PT Short Term Goal 1 (Week 2): pt will transfer sit<>stand with LRAD and consistant SUP PT Short Term Goal 2 (Week 2): pt will transfer bed<>chair with LRAD and consistant SUP PT Short Term Goal 3 (Week 2): pt will ambulate 741fwith LRAD and SUP PT Short Term Goal 4 (Week 2): Pt will initiate stair training.  Skilled Therapeutic Interventions/Progress Updates: Tx 1:  Pt received sitting EOB, attempting to transfer OOB with NT to go to toilet for BM.  Pt particularly weak and nauseated today.  She reported already having a BM and emesis.  Pt unable to stand to RW due to bil foot pain, 10/10, premedicated.  Pt preferred using wc to go to toilet.  Squat pivot to R,  bed to wc with max assist.   Pt unable to perform [facility]  Pt willing to use Stedy.  Sit> stand [place]c to [place].  Stedy> toilet with mod assist.  Pt unable to have BM.  Total assist peri care by PT, showing a smear only.  Pt requested to wash face and brush teeth at sink, from wc level, set up.  Wc propulsion using bil UEs on level tile x 50' including turns, with supervision and VCs for turns.  After resting, pt propelled x 25' before c/o "light-headedness.  Pt had eaten 1 bite of breakfast, she reported, before emesis.  She  declined drink or food.   Pt requested getting back to bed. Use of Stedy for transfer wc> bed, max assist to stand, min assist to sit. Sit> supine with extra time, supervision.  At conclusion of session, pt resting in bed with needs at hand and alarm set.   Tx 2:  Pt received resting in bed.  Pt reported pain 10/10 bil LLEs, L>R.  Pt had not eaten any lunch but had just ordered some. Pt restless and moving in/out of R and L side lying during session, supervision.  She stated that she could not get OOB this session due to pain, and became tearful.  She was willing to do bedside tx.   In supine HOB raised and bil LEs supported on pillow: 10 x 2 cervical flexion for abdominal activation; 15 x 1 bil adductor squeezes while counting aloud.   Pt reported that the last time (approx 7 yrs ago) that she had this much pain, she had blood clot LLE. PT spoke with ToGarvin FilaRN, who contacted Dr. EnTressa Busman Dr. EnTressa Busmanssessed pt and stated that she will order [place] She verbally advised PT to avoid LE exercises for pt.   PT reviewed diaphragmatic breathing and added relaxation imagery for relaxation and pain control.   Lunch arrived.  PT set pt  up with lunch tray with pt in bed, HOB raised. At conclusion of session, pt eating with needs at hand and alarm set.  Pt missed 15 min of tx time due to new order for [name] and MD recommendation.     Therapy Documentation Precautions:  Precautions Precautions: Fall Precaution Comments: L foot drop, bilateral intermittent hearing loss Restrictions Weight Bearing Restrictions: No  Pt missed 15 min of tx due to ? DVT LE.  Dr. Tressa Busman recommended no LE exercise.       Therapy/Group: Individual Therapy  Bentli Llorente 12/06/2021, 3:32 PM

## 2021-12-06 NOTE — Plan of Care (Signed)
  Problem: Consults Goal: RH GENERAL PATIENT EDUCATION Description: See Patient Education module for education specifics. Outcome: Progressing Goal: Skin Care Protocol Initiated - if Braden Score 18 or less Description: If consults are not indicated, leave blank or document N/A Outcome: Progressing Goal: Nutrition Consult-if indicated Outcome: Progressing   Problem: RH BOWEL ELIMINATION Goal: RH STG MANAGE BOWEL WITH ASSISTANCE Description: STG Manage Bowel with min Assistance. Outcome: Progressing Goal: RH STG MANAGE BOWEL W/MEDICATION W/ASSISTANCE Description: STG Manage Bowel with Medication with min Assistance. Outcome: Progressing   Problem: RH BLADDER ELIMINATION Goal: RH STG MANAGE BLADDER WITH ASSISTANCE Description: STG Manage Bladder With min Assistance Outcome: Progressing   Problem: RH SKIN INTEGRITY Goal: RH STG SKIN FREE OF INFECTION/BREAKDOWN Description: Skin will be free of any additional breakdown/infection from the time being admitted to rehab with min assist Outcome: Progressing Goal: RH STG MAINTAIN SKIN INTEGRITY WITH ASSISTANCE Description: STG Maintain Skin Integrity With min Assistance. Outcome: Progressing Goal: RH STG ABLE TO PERFORM INCISION/WOUND CARE W/ASSISTANCE Description: STG Able To Perform Incision/Wound Care With total Assistance. Outcome: Progressing   Problem: RH SAFETY Goal: RH STG ADHERE TO SAFETY PRECAUTIONS W/ASSISTANCE/DEVICE Description: STG Adhere to Safety Precautions With cuing Assistance/Device. Outcome: Progressing   Problem: RH PAIN MANAGEMENT Goal: RH STG PAIN MANAGED AT OR BELOW PT'S PAIN GOAL Description: Pain will be managed at 4 out of 10 on pain scale with min assist using PRN medications Outcome: Not Progressing Note: Patient pain stays at a 10/10 to b/l feet and is affecting participation in therapy.   Problem: RH KNOWLEDGE DEFICIT GENERAL Goal: RH STG INCREASE KNOWLEDGE OF SELF CARE AFTER  HOSPITALIZATION Description: Patient/caregiver will be able to managed medications, skin/wound care, and self care from nursing education and handouts independently  Outcome: Progressing   Problem: Fluid Volume: Goal: Hemodynamic stability will improve Outcome: Progressing   Problem: Clinical Measurements: Goal: Diagnostic test results will improve Outcome: Progressing Goal: Signs and symptoms of infection will decrease Outcome: Progressing   Problem: Respiratory: Goal: Ability to maintain adequate ventilation will improve Outcome: Progressing   Problem: Education: Goal: Understanding of CV disease, CV risk reduction, and recovery process will improve Outcome: Progressing Goal: Individualized Educational Video(s) Outcome: Progressing   Problem: Activity: Goal: Ability to return to baseline activity level will improve Outcome: Progressing   Problem: Cardiovascular: Goal: Ability to achieve and maintain adequate cardiovascular perfusion will improve Outcome: Progressing Goal: Vascular access site(s) Level 0-1 will be maintained Outcome: Progressing   Problem: Health Behavior/Discharge Planning: Goal: Ability to safely manage health-related needs after discharge will improve Outcome: Progressing   Problem: Education: Goal: Knowledge of General Education information will improve Description: Including pain rating scale, medication(s)/side effects and non-pharmacologic comfort measures Outcome: Progressing   Problem: Health Behavior/Discharge Planning: Goal: Ability to manage health-related needs will improve Outcome: Progressing

## 2021-12-06 NOTE — Progress Notes (Signed)
[facility] Progress Note   Subjective:   On/Off N/V No other issues Seen in room  Objective Vitals:   12/05/21 1957 12/06/21 0545 12/06/21 0943 12/06/21 0943  BP: (!) 118/48 101/69 (!) 113/50 (!) 113/50  Pulse: 87 83 88 88  Resp: 17 16    Temp: 98.2 F (36.8 C) 98.9 F (37.2 C)    TempSrc: Oral Oral    SpO2: 100% 100%    Weight:      Height:       Physical Exam General: Well appearing woman, NAD.  Heart: RRR; no murmur Lungs: CTAB; no rales Abdomen: soft Extremities: trace LE edema Dialysis Access: LUE AVF + bruit  Additional Objective Labs: Basic Metabolic Panel: Recent Labs  Lab 11/30/21 1546 12/03/21 0941  NA 138 138  K 3.7 3.7  CL 96* 96*  CO2 23 24  GLUCOSE 88 103*  BUN 57* 57*  CREATININE 6.48* 6.64*  CALCIUM 8.3* 7.6*  PHOS 6.0* 6.5*    Liver Function Tests: Recent Labs  Lab 11/30/21 1546 12/03/21 0941  ALBUMIN 1.9* 1.8*    CBC: Recent Labs  Lab 11/30/21 1546  WBC 21.8*  HGB 7.5*  HCT 26.6*  MCV 83.1  PLT 499*    Medications:    aspirin  81 mg Oral Daily   atorvastatin  40 mg Oral Daily   clopidogrel  75 mg Oral Q breakfast   darbepoetin (ARANESP) injection - NON-DIALYSIS  200 mcg Subcutaneous Q Wed-1800   doxercalciferol  7 mcg Intravenous Q M,W,F-HD   heparin  5,000 Units Subcutaneous Q8H   lanthanum  1,000 mg Oral Q supper   leptospermum manuka honey  1 Application Topical Daily   lidocaine  1 patch Transdermal Q24H   metoCLOPramide  10 mg Oral TID AC   metoprolol succinate  12.5 mg Oral Daily   montelukast  10 mg Oral QHS   pantoprazole  40 mg Oral BID   pneumococcal 20-valent conjugate vaccine  0.5 mL Intramuscular Tomorrow-1000   thiamine  100 mg Oral Daily    Dialysis Orders: MWF [facility] 3h 76mn 400/600  61kg  2/2 bath P2  Hep 4000  LUE AVF - mircera 200 mcg IV q2, last 10/16, due 10/30 - venofer '100mg'$  IV tiw thru 11/06 - doxercalciferol 7 ug tiw - sod thiosulfate 25 gm iv tiw    Assessment/Plan: Septic shock - sp course of Zosyn, now resolved -> CIR d/t debility  ESRD: Required CRRT on admit, now back to iHD MWF. On schedule next Tx 11/27: 3.5h AVF, Tight heparin 2L UF  HTN/volume: BP controlled, mostly euvolemic on exam.  Anemia of ESRD: Hgb 7s. Continue max dose Aranesp 2054m weekly while here. No IV Fe with ferritin >5000 Secondary HPTH: CorrCa ok, Phos high. She has trouble tolerating any binder secondary to gastroparesis, trying Fosrenol once daily for now. Nutrition: Alb low,  continue supplements sometimes refusing Nepro Encephalopathy - D/t shock/meds?, better. Had some hearing loss and foot drop 11/7 now spontaneously improving. No offending meds Myoclonus: severe, after initial consult. Felt due to meds (cefepime, gabapentin and narcotics). Resolved.  Tumoral calcinosis L hip - long-standing painful issue for her, on NaThio empirically as op, although hasn't helped. [facility]. Recultured 11/2 - NGTD. T2DM with severe gastroparesis - Ongoing issue, s/p esophageal myotomy 09/17/21. Has issues swallow/keeping pills down. L foot pain - Xray negative, uric acid normal. VVS consulted, s/p LE angiography with improvement in pain Leukocytosis - persistent.  Per PMD. Dispo: Tentative d/c plan of 12/12/21  Rexene Agent, MD  12/06/2021, 10:14 AM  [facility]

## 2021-12-07 ENCOUNTER — Encounter (HOSPITAL_COMMUNITY): Payer: Medicare Other

## 2021-12-07 ENCOUNTER — Inpatient Hospital Stay (HOSPITAL_COMMUNITY): Payer: Medicare Other

## 2021-12-07 DIAGNOSIS — R52 Pain, unspecified: Secondary | ICD-10-CM

## 2021-12-07 LAB — CBC WITH DIFFERENTIAL/PLATELET
Abs Immature Granulocytes: 0.15 10*3/uL — ABNORMAL HIGH (ref 0.00–0.07)
Basophils Absolute: 0.1 10*3/uL (ref 0.0–0.1)
Basophils Relative: 0 %
Eosinophils Absolute: 0.1 10*3/uL (ref 0.0–0.5)
Eosinophils Relative: 0 %
HCT: 23.7 % — ABNORMAL LOW (ref 36.0–46.0)
Hemoglobin: 7 g/dL — ABNORMAL LOW (ref 12.0–15.0)
Immature Granulocytes: 1 %
Lymphocytes Relative: 9 %
Lymphs Abs: 1.5 10*3/uL (ref 0.7–4.0)
MCH: 23.4 pg — ABNORMAL LOW (ref 26.0–34.0)
MCHC: 29.5 g/dL — ABNORMAL LOW (ref 30.0–36.0)
MCV: 79.3 fL — ABNORMAL LOW (ref 80.0–100.0)
Monocytes Absolute: 0.8 10*3/uL (ref 0.1–1.0)
Monocytes Relative: 5 %
Neutro Abs: 14.7 10*3/uL — ABNORMAL HIGH (ref 1.7–7.7)
Neutrophils Relative %: 85 %
Platelets: 365 10*3/uL (ref 150–400)
RBC: 2.99 MIL/uL — ABNORMAL LOW (ref 3.87–5.11)
RDW: 19.3 % — ABNORMAL HIGH (ref 11.5–15.5)
WBC: 17.3 10*3/uL — ABNORMAL HIGH (ref 4.0–10.5)
nRBC: 0 % (ref 0.0–0.2)

## 2021-12-07 MED ORDER — DULOXETINE HCL 20 MG PO CPEP
20.0000 mg | ORAL_CAPSULE | Freq: Every day | ORAL | Status: DC
  Administered 2021-12-07 – 2021-12-08 (×2): 20 mg via ORAL
  Filled 2021-12-07 (×2): qty 1

## 2021-12-07 MED ORDER — PENTAFLUOROPROP-TETRAFLUOROETH EX AERO
INHALATION_SPRAY | CUTANEOUS | Status: AC
  Filled 2021-12-07: qty 30

## 2021-12-07 NOTE — Progress Notes (Signed)
Physical Therapy Session Note  Patient Details  Name: Susan Horton MRN: [id number] Date of Birth: May 26, 1970  Today's Date: 12/07/2021 PT Individual Time: 4720-7218 PT Individual Time Calculation (min): 28 min   Today's Date: 12/07/2021 PT Missed Time: 32 Minutes Missed Time Reason: Pain;Other (Comment) (awaiting results from doppler)  Short Term Goals: Week 1:  PT Short Term Goal 1 (Week 1): pt will transfer sit<>stand with LRAD and mod A consistantly PT Short Term Goal 1 - Progress (Week 1): Met PT Short Term Goal 2 (Week 1): pt will transfer bed<>chair with LRAD and min A PT Short Term Goal 2 - Progress (Week 1): Met PT Short Term Goal 3 (Week 1): pt will ambulate 6f with LRAD and mod A PT Short Term Goal 3 - Progress (Week 1): Met Week 2:  PT Short Term Goal 1 (Week 2): pt will transfer sit<>stand with LRAD and consistant SUP PT Short Term Goal 2 (Week 2): pt will transfer bed<>chair with LRAD and consistant SUP PT Short Term Goal 3 (Week 2): pt will ambulate 739fwith LRAD and SUP PT Short Term Goal 4 (Week 2): Pt will initiate stair training.  Skilled Therapeutic Interventions/Progress Updates:   Received pt supine in bed reporting not doing well. Pt stated she is in intense 10/10 pain in bilateral feet and legs. Pt reported doppler just completed to check for DVT and waiting results. Per MD, pt cleared to participate in therapy to tolerance, however pt politely declined any OOB mobility or bed level exercises due to pain and fatigue but was agreeable to therapist providing HEP. Provided pt with HEP and educated on frequency/duration/technique for the following exercises: -Supine Bridge  - 1 x daily - 7 x weekly - 3 sets - 10 reps - Supine Active Straight Leg Raise  - 1 x daily - 7 x weekly - 3 sets - 10 reps - Clamshell  - 1 x daily - 7 x weekly - 3 sets - 10 reps - Seated Long Arc Quad  - 1 x daily - 7 x weekly - 3 sets - 10 reps - Seated March  - 1 x daily - 7 x weekly - 3  sets - 10 reps - Seated Hip Adduction Isometrics with Ball  - 1 x daily - 7 x weekly - 3 sets - 10 reps - Seated Heel Raise  - 1 x daily - 7 x weekly - 3 sets - 15 reps Concluded session with pt supine in bed, needs within reach, and bed alarm on. Pt asking to perform bed level bathing with OT in next session, primary OT notified. 32 minutes missed of skilled physical therapy due to pain.   Therapy Documentation Precautions:  Precautions Precautions: Fall Precaution Comments: L foot drop, bilateral intermittent hearing loss Restrictions Weight Bearing Restrictions: No  Therapy/Group: Individual Therapy AnAlfonse AlpersT, DPT  12/07/2021, 6:50 AM

## 2021-12-07 NOTE — Progress Notes (Signed)
Venous duplex lower ext  has been completed. Refer to [facility] under chart review to view preliminary results.   12/07/2021  10:02 AM Elenor Quinones, Bonnye Fava

## 2021-12-07 NOTE — Progress Notes (Signed)
   12/07/21 1050  Clinical Encounter Type  Visited With Patient  Visit Type Follow-up  Referral From Physician;Nurse (Krutika P. Ranell Patrick, MD; Louanne Skye, RN)  Consult/Referral To Chaplain Albertina Parr Morene Crocker)   Chaplain Responded to Banks request for signing of Advance Directive. Patient states that she has not yet completed Advance Directive. [address] [place], [place] [phone]

## 2021-12-07 NOTE — Progress Notes (Signed)
PROGRESS NOTE   Subjective/Complaints:  Patient's chart reviewed- No issues reported overnight Vitals signs stable  Labs stable  ROS:  + L foot pain  +left hip pain. +neuropathy. Denies fevers, chills, N/V, abdominal pain, constipation, diarrhea, SOB, cough, chest pain, new weakness or paraesthesias.    Objective:   No results found. Recent Labs    12/07/21 0552  WBC 17.3*  HGB 7.0*  HCT 23.7*  PLT 365    No results for input(s): "NA", "K", "CL", "CO2", "GLUCOSE", "BUN", "CREATININE", "CALCIUM" in the last 72 hours.   Intake/Output Summary (Last 24 hours) at 12/07/2021 0733 Last data filed at 12/06/2021 1831 Gross per 24 hour  Intake 596 ml  Output --  Net 596 ml     Pressure Injury 11/15/21 Sacrum Medial Stage 2 -  Partial thickness loss of dermis presenting as a shallow open injury with a red, pink wound bed without slough. (Active)  11/15/21 1030  Location: Sacrum  Location Orientation: Medial  Staging: Stage 2 -  Partial thickness loss of dermis presenting as a shallow open injury with a red, pink wound bed without slough.  Wound Description (Comments):   Present on Admission: Yes    Physical Exam: Vital Signs Blood pressure (!) 101/50, pulse 87, temperature 98.6 F (37 C), temperature source Oral, resp. rate 16, height '5\' 5"'$  (1.651 m), weight 63.5 kg, SpO2 92 %.  Gen: no distress, normal appearing, BMI 23.3 HEENT: oral mucosa pink and moist, NCAT Cardio: RRR, no MRG Chest: normal effort, normal rate of breathing Abd: soft, non-distended Ext: trace LE edema b/l, left UE AVF + bruit Psych: pleasant, normal affect Musculoskeletal:     Cervical back: Normal range of motion.     Comments: Left hip tender with ROM, appears swollen. Large dressing in place. Left foot hypersensitive to touch.  Skin:    Comments:  Left hip Xeroform dressing in place.  2 incisions left posterior lateral thigh without odor  but with yellowish/tan drainage with clumpy, white debris. No odor. Wounds themselves appear pink without necrotic tissue.  Left foot: tip of great toe and 2nd/third toes appear ischemic. AVF in LUE  Stage 2 to sacrum Neurological:     Mental Status: She is alert.     Comments:    Alert and oriented x 3. Follows commands, Normal insight and awareness. Intact Memory. Normal language and speech. CN 2-12 grossly intact. UE grossly 5/5. RLE 3+ to 4/5 prox to 4+/5 distally. LLE 2/5 prox to trace/5 ankle. Stocking glove sensory loss bilateral LE, L>R up to mid calf.   Psychiatric:        Mood and Affect: Mood normal.    Flat affect      Behavior: Behavior normal.    Assessment/Plan: 1. Functional deficits which require 3+ hours per day of interdisciplinary therapy in a comprehensive inpatient rehab setting. Physiatrist is providing close team supervision and 24 hour management of active medical problems listed below. Physiatrist and rehab team continue to assess barriers to discharge/monitor patient progress toward functional and medical goals  Care Tool:  Bathing    Body parts bathed by patient: Right arm, Left arm, Chest, Abdomen, Front perineal  area, Right upper leg, Left upper leg, Right lower leg, Left lower leg, Face, Buttocks   Body parts bathed by helper: Buttocks     Bathing assist Assist Level: Contact Guard/Touching assist Assistive Device Comment: stand pivot, shower chair   Upper Body Dressing/Undressing Upper body dressing   What is the patient wearing?: Pull over shirt    Upper body assist Assist Level: Independent with assistive device Assistive Device Comment: Seated on WC  Lower Body Dressing/Undressing Lower body dressing      What is the patient wearing?: Underwear/pull up, Pants     Lower body assist Assist for lower body dressing: Minimal Assistance - Patient > 75%     Toileting Toileting    Toileting assist Assist for toileting: Maximal Assistance -  Patient 25 - 49%     Transfers Chair/bed transfer  Transfers assist     Chair/bed transfer assist level: Maximal Assistance - Patient 25 - 49%     Locomotion Ambulation   Ambulation assist   Ambulation activity did not occur: Safety/medical concerns (pain, fatigue, weakness/deconditioning, decreased balance)  Assist level: Contact Guard/Touching assist Assistive device: Walker-rolling Max distance: 10   Walk 10 feet activity   Assist  Walk 10 feet activity did not occur: Safety/medical concerns (pain, fatigue, weakness/deconditioning, decreased balance)  Assist level: Contact Guard/Touching assist Assistive device: Walker-rolling   Walk 50 feet activity   Assist Walk 50 feet with 2 turns activity did not occur: Safety/medical concerns (pain, fatigue, weakness/deconditioning, decreased balance)         Walk 150 feet activity   Assist Walk 150 feet activity did not occur: Safety/medical concerns (pain, fatigue, weakness/deconditioning, decreased balance)         Walk 10 feet on uneven surface  activity   Assist Walk 10 feet on uneven surfaces activity did not occur: Safety/medical concerns (pain, fatigue, weakness/deconditioning, decreased balance)         Wheelchair     Assist Is the patient using a wheelchair?: Yes Type of Wheelchair: Manual    Wheelchair assist level: Supervision/Verbal cueing Max wheelchair distance: 50    Wheelchair 50 feet with 2 turns activity    Assist        Assist Level: Supervision/Verbal cueing   Wheelchair 150 feet activity     Assist      Assist Level: Supervision/Verbal cueing   Blood pressure (!) 101/50, pulse 87, temperature 98.6 F (37 C), temperature source Oral, resp. rate 16, height '5\' 5"'$  (1.651 m), weight 63.5 kg, SpO2 92 %.    Medical Problem List and Plan: 1. Functional deficits secondary to septic shock/metabolic with debility and encephalopathy. Pt appears cognitively to be at  baseline. -  Antibiotic therapy 10/29 - 11/6             -patient may shower if left hip is covered             -ELOS/Goals: 10-12 days, mod I to supervision goals  Continue CIR, continue PT/OT  -Seen by neuropsychology  working on coping/adjustment  2.  Impaired mobility: working on sit to stands. Provided accomodation letter for ramp and bathroom. -DVT/anticoagulation:  Pharmaceutical: continue Heparin given bleeding risk - intermittently refusing, educated patient on need 11/25. Discussed SCDs as an alternative.               -antiplatelet therapy: Aspirin 81 mg daily and Plavix 75 mg daily 3. Left foot pain, Chronic periopheral polyneuropathy (likely d/t DM 2): changed Lidoderm patch to bottom  of right foot, oxycodone as needed. Provided list of foods for pain relief. Manuka honey ordered for wound healing. Avoid gabapentin since causes confusion. Add Cymbalta daily.   4. Mood/Behavior/Sleep: Provide emotional support             -antipsychotic agents: N/A 5. Neuropsych/cognition: This patient is capable of making decisions on her own behalf. 6. Left hip incision: Continue BID to TID dressing changes to left hip. Discussed with patient that surgeon stated continued drainage is to be expected. Apply manuka honey.  -WOC follow-up for left hip drainage. -completed course of broad spectrum abx on 11/6   -Assistance provided by orthopedics. 7. Fluids/Electrolytes/Nutrition: Routine in and outs with follow-up chemistries 8.  Peripheral vascular disease.  Follow-up vascular surgery for occluded stents status post right common iliac left common iliac stenting with balloon angioplasty left common femoral and stent left SFA 11/16/2021 per Dr. [name] 9.  Tumoral calcinosis.  Recent excision of tumoral calcinosis left hip thigh [facility] 10/20/2021.  Weightbearing as tolerated. Communicated MRI results to ortho 10.  End-stage renal disease.  Continue hemodialysis as directed. Discussed improved  Creatinine to 3.08 on 11/14.   -11/23 Declined HD yesterday, she says she will agree to do this Friday  HD today 11.  Acute on chronic anemia.  Continue iron supplement 12.  Leukocytosis.  (20k)  -Broad spectrum antibiotic therapy completed 11/16/2021 and monitor.  Latest blood culture showed no growth             -appearance of drainage idiscussed with ortho and is to be expected. Recent CT (10/30) demonstrating changes c/w tumoral calcinosis and mild cellulitis.              -MRI hip obtianed and stable              -ischemic toes LLE could also be a source             -continue wound care as above             -pt feels well otherwise and no other signs of infection             -consider H/O consult if persistent  -WBC yesterday was 21.8, recheck ordered for Monday  13.  Asthma without exacerbation as well as remote history of tobacco abuse.  Continue inhalers as directed check oxygen saturations every shift 14. Diet controlled Diabetes mellitus. Blood sugar checks discontinued 15.  Hypertension.  Toprol-XL 25 mg daily.  Monitor with increased mobility -11/24 well controlled, monitor    12/07/2021    5:11 AM 12/07/2021    5:00 AM 12/06/2021    7:50 PM  Vitals with BMI  Weight  140 lbs   BMI  78.5   Systolic 885  027  Diastolic 50  45  Pulse 87  86    16.  Abrupt onset bilateral sensorineural hearing loss.  Confirmed by audiology 11/7 the symptoms have resolved.  Follow-up outpatient. 17.  Diastolic and systolic congestive heart failure.  Monitor for any signs of fluid overload. Daily weights ordered Filed Weights   12/02/21 0500 12/03/21 0700 12/07/21 0500  Weight: 60.6 kg 61.9 kg 63.5 kg  Weight overall stable, HD today  18. Pruritus: sarna lotion ordered.  19. Diarrhea: continue imodium  -11/23 Reports BM 2 days ago, order miralax prN in case constipation develops  -11/24 continue miralax prn-she would like to take medication after dialysis completed. She says she will ask  for it later.  Will add lactulose PRN for more severe constipation            - 11/25: Last recorded BM 11/17; took PRN miralax last night without results; per patient last BM 11/20. Ordered to give PRN lactulose tonight. +passing flatus  20. Insomnia: secondary to diarrhea, continue imodium.  21. Dysuria: UA/UC ordered -11/25 will stop diflucan   22. Chronic nausea  -Continue Zofran PRN  -Suspect GERD may be contributing, increase PPI to BID  -11/23 a little improved Monitor response to increased PPI frequency 23. GERD  -increase pantoprazole to BID   LOS: 14 days A FACE TO FACE EVALUATION WAS PERFORMED  Clide Deutscher Chloris Marcoux 12/07/2021, 7:33 AM

## 2021-12-07 NOTE — Progress Notes (Signed)
Occupational Therapy Session Note  Patient Details  Name: Susan Horton MRN: [id number] Date of Birth: Jan 15, 1970  Today's Date: 12/07/2021 OT Individual Time: 0805-0900 & 2952-8413 OT Individual Time Calculation (min): 55 min & 68 min OT missed time: 7 mins Missed time reason: faitgue; pain  Short Term Goals: Week 2:  OT Short Term Goal 1 (Week 2): Pt will complete shower transfer with CGA using LRAD OT Short Term Goal 2 (Week 2): Pt will complete ambulatory toilet transfer with CGA using LRAD OT Short Term Goal 3 (Week 2): Pt will tolerate greater than 5 mins in stance using LRAD during table top activity in prep for ADL needs  Skilled Therapeutic Interventions/Progress Updates:  Session 1 Skilled OT intervention completed with focus on activity tolerance, BUE endurance. Pt received semi-supine in bed, agreeable to session. 10/10 pain in BLE with RLE > LLE. Nurse in room already to administer meds, however pt denied pain meds. Therapist received verbal report from MD that pt was cleared for therapy with plan for doppler for potential DVT at later time. Of note- pt with inability to place weight on either foot during therapy, therefore modified to seated EOB exercises to reduce pain 2/2 inability to locate slideboard for transfer to w/c. Nurse notified of pain med request part of way through session due to increase in pain reported.  Required increased time for transitions due fatigue and pain. Supervision for semi-supine to EOB. Therapist placed w/c cushion under both feet for comfort throughout.  Seated EOB, pt completed the following exercises to promote endurance and shoulder integrity needed for use of RW with functional transfers and BADLs: (With red theraband) x10 reps Horizontal abduction Self-anchored shoulder flexion each arm Self-anchored bicep flexion each arm Self-anchored tricep extension each arm Therapist anchored scapular retraction each arm Alternating chest  presses Shoulder external rotation Shoulder extension Shoulder diagonal pulls -increased cues needed for form due to pt's level of distractibility due to pain and discomfort.  Transitioned to supine with min A for BLE, then min A for positioning to R side lying, with pillows placed in between legs for comfort, where pt remained with bed alarm on/activated, and with all needs in reach at end of session.  Session 2 Skilled OT intervention completed with focus on self-care at bed level, pain management. Pt received in R side-lying, agreeable to session. 8/10 pain reported BLE, with MD already aware and pt pre-medicated with new medication to assist with the nerve pain. Therapist offered modifications and repositioning throughout for pain reduction.  Pt requested to wash up, however asked if bed level was an option due to pain vs shower level. Completed doffing of shirt with min A, then able to wash UB with set up A. Pt unable to tolerate removal of clothes due to pain and sensitivity, therefore therapist cut off paper scrubs with pt rolling side to side for total A removal. Bathed LB with set up A with use of rolling R<>L with supervision and heavy use of bed rails. Able to apply lotion with set up A to BLE, with pt indicating increased tolerance to touch in both feet if she does it herself vs being done to her. Therapist doffed sacral pad with total A, then donned honey medication and new sacral pad for pressure relief/prevention. Of note- pt's pressure injury still open therefore therapist encouraged pt to lay in sidelying or rotate frequently to prevent worsening pressure on the sacrum. Mod A needed for LB dressing with underwear/pad and pants due  to pain with progressing assist needed for rolling up to min A due to fatigue.  Discussed LLE positioning, as pt currently internally rotates L leg and ankle with medial deviation. MD notified of need for trial with prevalon boot to see if it can lightly  assist with positioning vs PRAFO as pt will likely not tolerate the full boot due to hypersensitivity.   Attempted scooting/bridging towards [facility] however pt unable, therefore +2 utilized for boosting to [facility]. Min A needed to transition into L sidelying, with several pillows placed in between legs and under R foot for comfort and proper alignment. Pt missed 7 mins of OT intervention due to pain/fatigue, will make up missed mins as schedule allows. Pt remained in L side-lying with bed alarm on/activated, and with all needs in reach at end of session.   Therapy Documentation Precautions:  Precautions Precautions: Fall Precaution Comments: L foot drop, bilateral intermittent hearing loss Restrictions Weight Bearing Restrictions: No    Therapy/Group: Individual Therapy  Blase Mess, MS, OTR/L  12/07/2021, 12:08 PM

## 2021-12-07 NOTE — Progress Notes (Signed)
Received patient in bed to unit.  Alert and oriented.  Informed consent signed and in chart.   Treatment initiated: 1300 Treatment completed: 1811  Patient tolerated well.  Transported back to the room  Alert, without acute distress.  Hand-off given to patient's nurse.   Access used: FISTULA Access issues: NONE  Total UF removed: 2000 Medication(s) given: NONE Post HD VS: 112/51 Post HD weight: 62.2 KG   [facility]

## 2021-12-07 NOTE — Progress Notes (Signed)
Noted pt scheduled for d/c on 12/2 per CSW note. Contacted [place] to make clinic aware of pt's d/c date and that pt should resume next Monday. Will assist as needed.   Melven Sartorius Renal Navigator [phone]

## 2021-12-07 NOTE — Progress Notes (Signed)
[facility] Progress Note   Subjective:   Patient seen and examined at bedside.  Reports worsened pain in LLE this morning.  Lower extremity venous US with no evidence of DVT/popliteal cyst.  Also reports worsened nausea.  Denies CP, SOB, abdominal pain and diarrhea.   Objective Vitals:   12/07/21 0511 12/07/21 0804 12/07/21 0804 12/07/21 1312  BP: (!) 101/50 (!) 115/53 (!) 115/53 (!) 108/47  Pulse: 87 86 86 83  Resp: 16   17  Temp: 98.6 F (37 C)   98.5 F (36.9 C)  TempSrc: Oral   Oral  SpO2: 92%   98%  Weight:      Height:       Physical Exam General:WDWN female in NAD Heart:RRR, on mrg Lungs:CTAB, nml WOB on RA Abdomen:soft, NTND Extremities:no LE edema Dialysis Access: LU AVF +b   Filed Weights   12/02/21 0500 12/03/21 0700 12/07/21 0500  Weight: 60.6 kg 61.9 kg 63.5 kg    Intake/Output Summary (Last 24 hours) at 12/07/2021 1524 Last data filed at 12/07/2021 1300 Gross per 24 hour  Intake 238 ml  Output --  Net 238 ml    Additional Objective Labs: Basic Metabolic Panel: Recent Labs  Lab 11/30/21 1546 12/03/21 0941  NA 138 138  K 3.7 3.7  CL 96* 96*  CO2 23 24  GLUCOSE 88 103*  BUN 57* 57*  CREATININE 6.48* 6.64*  CALCIUM 8.3* 7.6*  PHOS 6.0* 6.5*   Liver Function Tests: Recent Labs  Lab 11/30/21 1546 12/03/21 0941  ALBUMIN 1.9* 1.8*   CBC: Recent Labs  Lab 11/30/21 1546 12/07/21 0552  WBC 21.8* 17.3*  NEUTROABS  --  14.7*  HGB 7.5* 7.0*  HCT 26.6* 23.7*  MCV 83.1 79.3*  PLT 499* 365   Blood Culture    Component Value Date/Time   SDES WOUND HIP 11/12/2021 1425   SPECREQUEST  LEFT 11/12/2021 1425   CULT  11/12/2021 1425    NO GROWTH 2 DAYS Performed at [facility], [place] [address] [place], [place] [address]    REPTSTATUS 11/14/2021 FINAL 11/12/2021 1425     Studies/Results: VAS [name] LOWER EXTREMITY VENOUS (DVT)  Result Date: 12/07/2021  Lower Venous DVT Study Patient Name:  Susan Horton  Date of  Exam:   12/07/2021 Medical Rec #: [id number]      Accession #:    [id number] Date of Birth: [birth year]       Patient Gender: F Patient Age:   51 years Exam Location:  [facility] Procedure:      VAS [name] LOWER EXTREMITY VENOUS (DVT) Referring Phys: Durel Salts --------------------------------------------------------------------------------  Indications: Pain. Other Indications: Peripheral neuropathy. Risk Factors: DVT 2015 Surgery History of bilateral iliac stents and SFA stents On Dialysis. Comparison Study: History of DVT in left leg in 2015. Performing Technologist: Oda Cogan RDMS, RVT  Examination Guidelines: A complete evaluation includes B-mode imaging, spectral Doppler, color Doppler, and power Doppler as needed of all accessible portions of each vessel. Bilateral testing is considered an integral part of a complete examination. Limited examinations for reoccurring indications may be performed as noted. The reflux portion of the exam is performed with the patient in reverse Trendelenburg.  +---------+---------------+---------+-----------+----------+--------------+ RIGHT    CompressibilityPhasicitySpontaneityPropertiesThrombus Aging +---------+---------------+---------+-----------+----------+--------------+ CFV      Full           Yes      Yes                                 +---------+---------------+---------+-----------+----------+--------------+  SFJ      Full                                                        +---------+---------------+---------+-----------+----------+--------------+ FV Prox  Full                                                        +---------+---------------+---------+-----------+----------+--------------+ FV Mid   Full                                                        +---------+---------------+---------+-----------+----------+--------------+ FV DistalFull                                                         +---------+---------------+---------+-----------+----------+--------------+ PFV      Full                                                        +---------+---------------+---------+-----------+----------+--------------+ POP      Full           Yes      Yes                                 +---------+---------------+---------+-----------+----------+--------------+ PTV      Full                                                        +---------+---------------+---------+-----------+----------+--------------+ PERO     Full                                                        +---------+---------------+---------+-----------+----------+--------------+   +---------+---------------+---------+-----------+----------+--------------+ LEFT     CompressibilityPhasicitySpontaneityPropertiesThrombus Aging +---------+---------------+---------+-----------+----------+--------------+ CFV      Full           Yes      Yes                                 +---------+---------------+---------+-----------+----------+--------------+ SFJ      Full                                                        +---------+---------------+---------+-----------+----------+--------------+  FV Prox  Full                                                        +---------+---------------+---------+-----------+----------+--------------+ FV Mid   Full                                                        +---------+---------------+---------+-----------+----------+--------------+ FV DistalFull                                                        +---------+---------------+---------+-----------+----------+--------------+ PFV      Full                                                        +---------+---------------+---------+-----------+----------+--------------+ POP      Full           Yes      Yes                                  +---------+---------------+---------+-----------+----------+--------------+ PTV      Full                                                        +---------+---------------+---------+-----------+----------+--------------+ PERO     Full                                                        +---------+---------------+---------+-----------+----------+--------------+     Summary: BILATERAL: - No evidence of deep vein thrombosis seen in the lower extremities, bilaterally. -No evidence of popliteal cyst, bilaterally.   *See table(s) above for measurements and observations. Electronically signed by Monica Martinez MD on 12/07/2021 at 12:41:45 PM.    Final     Medications:   aspirin  81 mg Oral Daily   atorvastatin  40 mg Oral Daily   clopidogrel  75 mg Oral Q breakfast   darbepoetin (ARANESP) injection - NON-DIALYSIS  200 mcg Subcutaneous Q Wed-1800   doxercalciferol  7 mcg Intravenous Q M,W,F-HD   DULoxetine  20 mg Oral Daily   heparin  5,000 Units Subcutaneous Q8H   lanthanum  1,000 mg Oral Q supper   leptospermum manuka honey  1 Application Topical Daily   lidocaine  1 patch Transdermal Q24H   metoCLOPramide  10 mg Oral TID AC   metoprolol succinate  12.5 mg Oral Daily   montelukast  10 mg Oral QHS   pantoprazole  40  mg Oral BID   pneumococcal 20-valent conjugate vaccine  0.5 mL Intramuscular Tomorrow-1000   thiamine  100 mg Oral Daily    Dialysis Orders: MWF [facility] 3h 85mn 400/600  61kg  2/2 bath P2  Hep 4000  LUE AVF - mircera 200 mcg IV q2, last 10/16, due 10/30 - venofer '100mg'$  IV tiw thru 11/06 - doxercalciferol 7 ug tiw - sod thiosulfate 25 gm iv tiw   Assessment/Plan: Septic shock - sp course of Zosyn, now resolved -> CIR d/t debility  ESRD: Required CRRT on admit, now back to iHD MWF. On schedule next Tx 11/27: 3.5h AVF, Tight heparin 2L UF  HTN/volume: BP controlled, mostly euvolemic on exam.  Anemia of ESRD: Hgb 7s. Continue max dose Aranesp 2055m weekly while  here. No IV Fe with ferritin >5000. Transfuse prn for Hgb<7 Secondary HPTH: CorrCa ok, Phos high. She has trouble tolerating any binder secondary to gastroparesis, trying Fosrenol once daily for now. Nutrition: Alb low, continue supplements sometimes refusing Nepro. Renal diet w/fluid restrictions.  Encephalopathy - D/t shock/meds?, better. Had some hearing loss and foot drop 11/7 now spontaneously improving. No offending meds Myoclonus: severe, after initial consult. Felt due to meds (cefepime, gabapentin and narcotics). Resolved.  Tumoral calcinosis L hip - long-standing painful issue for her, on NaThio empirically as op, although hasn't helped. [facility]. Recultured 11/2 - NGTD. T2DM with severe gastroparesis - Ongoing issue, s/p esophageal myotomy 09/17/21. Has issues swallow/keeping pills down. L foot pain - Xray negative, uric acid normal. VVS consulted, s/p LE angiography with improvement in pain. Worse again today, venous [place]/no evidence of DVT. Leukocytosis - persistent. Per PMD. Dispo: Tentative d/c plan of 12/12/21  LiJen MowPA-C [facility] 12/07/2021,3:24 PM  LOS: 14 days

## 2021-12-08 ENCOUNTER — Inpatient Hospital Stay (HOSPITAL_COMMUNITY): Payer: Medicare Other

## 2021-12-08 MED ORDER — DULOXETINE HCL 30 MG PO CPEP
30.0000 mg | ORAL_CAPSULE | Freq: Every day | ORAL | Status: DC
  Administered 2021-12-09: 30 mg via ORAL
  Filled 2021-12-08: qty 1

## 2021-12-08 MED ORDER — CHLORHEXIDINE GLUCONATE CLOTH 2 % EX PADS
6.0000 | MEDICATED_PAD | Freq: Every day | CUTANEOUS | Status: DC
  Administered 2021-12-15: 6 via TOPICAL

## 2021-12-08 NOTE — Progress Notes (Addendum)
Patient ID: Susan Horton, female   DOB: 02-28-1970, 51 y.o.   MRN: [id number]  3:30 PM: Patient [facility] referral sent to [facility] for review.  11/29: Patient approved for SN/Pt/Ot/Aide

## 2021-12-08 NOTE — Progress Notes (Signed)
Occupational Therapy Weekly Progress Note  Patient Details  Name: Susan Horton MRN: [id number] Date of Birth: [birth year]  Beginning of progress report period: November 30, 2021 End of progress report period: December 08, 2021  Patient has met 3 of 3 short term goals. Pt is making fluctuating but steady progress towards LTGs. Depending on pain/fatigue, pt can be anywhere from supervision to min A for sit > stands using RW, Supervision to min A for ambulatory transfers, is able to bathe at an overall supervision level, dress with Supervision and requires CGA assist for toileting tasks. Pt continues to demonstrate significant neuropathic pain/hypersensitivity in BLE, generalized endurance deficits, and delayed processing, resulting in difficulty completing BADL tasks without increased physical assist. Pt will benefit from continued skilled OT services to focus on mentioned deficits. Sister is planning to attend family ed prior to d/c.   Patient continues to demonstrate the following deficits: muscle weakness, decreased cardiorespiratoy endurance, unbalanced muscle activation and decreased coordination, delayed processing, and decreased standing balance and decreased balance strategies and therefore will continue to benefit from skilled OT intervention to enhance overall performance with BADL, iADL, and Reduce care partner burden.  Patient progressing toward long term goals..  Plan of care revisions: Upgraded to mod I.  OT Short Term Goals Week 1:  OT Short Term Goal 1 (Week 1): Pt will perform STS transfers with Mod A + LRAD in preparation ADL tasks. OT Short Term Goal 1 - Progress (Week 1): Met OT Short Term Goal 2 (Week 1): Pt will engage in standing table-top activities >2 minutes with Mod A + LRAD in preparation for ADL tasks. OT Short Term Goal 2 - Progress (Week 1): Met OT Short Term Goal 3 (Week 1): Pt will demonstrate carry-over of LB compensatory dressing techniques with supervision. OT  Short Term Goal 3 - Progress (Week 1): Met Week 2:  OT Short Term Goal 1 (Week 2): Pt will complete shower transfer with CGA using LRAD OT Short Term Goal 1 - Progress (Week 2): Met OT Short Term Goal 2 (Week 2): Pt will complete ambulatory toilet transfer with CGA using LRAD OT Short Term Goal 2 - Progress (Week 2): Met OT Short Term Goal 3 (Week 2): Pt will tolerate greater than 5 mins in stance using LRAD during table top activity in prep for ADL needs OT Short Term Goal 3 - Progress (Week 2): Met Week 3:  OT Short Term Goal 1 (Week 3): STG = LTG due to [place], MS, OTR/L  12/08/2021, 7:51 AM

## 2021-12-08 NOTE — Progress Notes (Signed)
Occupational Therapy Session Note  Patient Details  Name: Susan Horton MRN: [id number] Date of Birth: [birth year]  Today's Date: 12/08/2021 OT Individual Time: 1005-1100 OT Individual Time Calculation (min): 55 min    Short Term Goals: Week 2:  OT Short Term Goal 1 (Week 2): Pt will complete shower transfer with CGA using LRAD OT Short Term Goal 2 (Week 2): Pt will complete ambulatory toilet transfer with CGA using LRAD OT Short Term Goal 3 (Week 2): Pt will tolerate greater than 5 mins in stance using LRAD during table top activity in prep for ADL needs  Skilled Therapeutic Interventions/Progress Updates:  Skilled OT intervention completed with focus on activity tolerance, slide board transfers, BUE endurance. Pt received seated in w/c, agreeable to session. 10/10 pain reported in BLE for neuropathic pain- pre-medicated. Therapist offered rest breaks, repositioning and modifications for pain reduction.  Transported dependently in w/c <> gym for time.  Seated at [facility], pt completed the following for BUE endurance needed for slideboard and other functional transfers: -Level 2, 4x2 min intervals, with rest breaks needed for fatigue  Dependent placement of board under R hip, the with slow transition and mod cues, pt was able to transfer with mod A to EOM. With attempt to sit EOM, pt became increasingly lethargic as well as hypersensitive to the touch on BLE with inability to tolerate feet unsupported or supported due to pain. With slight decline at mat elevation and dependent placement of board under L hip, pt was able to transfer to w/c with light min A this time with heavier use of BUE demonstrated vs feet in which pt benefited from to minimize pain.  Back in room, pt agreeable to stay up in chair for PT, with chair alarm on/activated, ice provided per request and with all needs in reach at end of session.   Therapy Documentation Precautions:  Precautions Precautions: Fall Precaution  Comments: L foot drop, bilateral intermittent hearing loss Restrictions Weight Bearing Restrictions: No    Therapy/Group: Individual Therapy  Mari Battaglia E Jozlyn Schatz, MS, OTR/L  12/08/2021, 11:00 AM

## 2021-12-08 NOTE — Progress Notes (Signed)
Physical Therapy Session Note  Patient Details  Name: MAKYIA ERXLEBEN MRN: [id number] Date of Birth: 1970-04-21  Today's Date: 12/08/2021 PT Individual Time: 0731-0826 and 1100-1155 PT Individual Time Calculation (min): 55 min and 55 min  Short Term Goals: Week 1:  PT Short Term Goal 1 (Week 1): pt will transfer sit<>stand with LRAD and mod A consistantly PT Short Term Goal 1 - Progress (Week 1): Met PT Short Term Goal 2 (Week 1): pt will transfer bed<>chair with LRAD and min A PT Short Term Goal 2 - Progress (Week 1): Met PT Short Term Goal 3 (Week 1): pt will ambulate 45f with LRAD and mod A PT Short Term Goal 3 - Progress (Week 1): Met Week 2:  PT Short Term Goal 1 (Week 2): pt will transfer sit<>stand with LRAD and consistant SUP PT Short Term Goal 2 (Week 2): pt will transfer bed<>chair with LRAD and consistant SUP PT Short Term Goal 3 (Week 2): pt will ambulate 760fwith LRAD and SUP PT Short Term Goal 4 (Week 2): Pt will initiate stair training.  Skilled Therapeutic Interventions/Progress Updates:   Treatment Session 1 Received pt semi-reclined in bed, pt agreeable to PT treatment, and reported pain 9/10 in bilateral feet and lower legs - pt declined pain medication, requesting to wait until later. Session with emphasis on functional mobility/transfers, DME for discharge, generalized strengthening and endurance, and standing tolerance. Donned non-skid socks dependently with increased time as pt extremely hypersensitive and "sore" today. Pt transferred semi-reclined<>sitting EOB with HOB elevated and use of bedrails with supervision and increased time. Dressing on L hip began leaking - RN notified to change dressing. Pt performed seated LAQ 2x8 bilaterally while waiting for RN to come change dressing.  RN arrived to administer medication and change dressing - discussed equipment for D/C and recommendation for 16x16 manual WC with elevating legrests and brake extenders. Pt reported only  having rollator at home - will need RW. Pt would also benefit from Roho cushion for pressure relief of sacral and L buttock wounds - notified CSW. Pt attempted x 2 to stand from elevated EOB with RW, however unable due to increased "burning" sensation in feet. Retrieved slideboard and pt transferred bed<>WC via slideboard with min A - cues for hand placement and anterior weight shifting. Pt sat in WC at sink and brushed teeth and washed face with set up assist. Discussed rescheduling family ed to Thursday from 10-12 due to increased pain this morning. Concluded session with pt sitting in WC, needs within reach, and chair pad alarm on. Safety plan updated to slideboard +1 and RN notified.   Treatment Session 2 Received pt sitting in WC, pt agreeable to PT treatment, and reported pain 9/10 in bilateral feet and legs (premedicated). Session with emphasis on WC mobility and generalized strengthening and endurance. Pt declined practicing any more standing today due to increased foot pain (most likely from neuropathy). Pt performed WC mobility 7570fsing BUE and supervision with 2 rest breaks - limited by pain and fatigue. [facility] but declined transferring onto mat, requesting to remain in WC.Embarrasst performed the following exercises sitting in WC [facility] supervision with emphasis on UE/LE strength: -horizontal chest press with 4lb dowel 4x5 reps -overhead chest press with 2lb dowel 2x5 and 1x10 reps -bicep curls with 4lb dowel 2x12 -hip adduction ball squeezes 2x10 with 3in step underneath feet for support -hip flexion with 0.75lb ankle weight on LLE and unweighted on RLE (  unable to tolerate sensation of weight) 2x10 bilaterally. Of note, pt unable to lift RLE today; only able to raise up onto toes -hip abduction with grn TB 2x10 Pt transported back to room in WC dependently. Concluded session with pt sitting in [facility] with all needs within reach. Provided pt with warm blanket and fresh  water.   Therapy Documentation Precautions:  Precautions Precautions: Fall Precaution Comments: L foot drop, bilateral intermittent hearing loss Restrictions Weight Bearing Restrictions: No  Therapy/Group: Individual Therapy Alfonse Alpers PT, DPT  12/08/2021, 6:53 AM

## 2021-12-08 NOTE — Progress Notes (Signed)
Patient ID: Susan Horton, female   DOB: 07/03/1970, 51 y.o.   MRN: [id number]  Bedside Commode, Transfer Bench, Wheelchair and [employer] ordered through [employer].  Patient will require a Roho cushion due to wound on bottom. Sw will require additional dx to support. Sw will discuss with PA.

## 2021-12-08 NOTE — Progress Notes (Signed)
Occupational Therapy Session Note  Patient Details  Name: Susan Horton MRN: [id number] Date of Birth: 1970/01/12  Today's Date: 12/08/2021 OT Missed Time: 82 Minutes Missed Time Reason: CT/MRI   Short Term Goals: Week 3:  OT Short Term Goal 1 (Week 3): STG = LTG due to ELOS  Skilled Therapeutic Interventions/Progress Updates:  **Pt missed 30 minutes of skilled OT intervention due to MRI.**  Therapy Documentation Precautions:  Precautions Precautions: Fall Precaution Comments: L foot drop, bilateral intermittent hearing loss Restrictions Weight Bearing Restrictions: No   Maudie Mercury, OTR/L, MSOT  12/08/2021, 2:54 PM

## 2021-12-08 NOTE — Progress Notes (Signed)
[facility] Progress Note   Subjective:   Patient seen and examined in room.  Continues to have pain in R foot.  Tolerating therapy as much as possible today. Reports n/v last night but improved this AM.  Tolerated dialysis ok yesterday.  Denies CP, SOB, orthopnea and abdominal pain.   Objective Vitals:   12/07/21 1942 12/07/21 2101 12/07/21 2127 12/08/21 0454  BP: (!) 112/51  (!) 106/47 (!) 116/48  Pulse: 99  94 94  Resp: '20  19 18  '$ Temp: 98.2 F (36.8 C)  98.9 F (37.2 C) 99.3 F (37.4 C)  TempSrc: Oral  Oral Oral  SpO2: 97%  100% 96%  Weight:  62.4 kg  59.4 kg  Height:       Physical Exam General:well appearing female in NAD Heart:RRR Lungs:CTAB, nml WOB on RA Abdomen:soft, NTND Extremities:no LE edema Dialysis Access: LUA VF +b/t   Filed Weights   12/07/21 1557 12/07/21 2101 12/08/21 0454  Weight: 64.2 kg 62.4 kg 59.4 kg    Intake/Output Summary (Last 24 hours) at 12/08/2021 1018 Last data filed at 12/08/2021 0700 Gross per 24 hour  Intake 120 ml  Output 2000 ml  Net -1880 ml    Additional Objective Labs: Basic Metabolic Panel: Recent Labs  Lab 12/03/21 0941  NA 138  K 3.7  CL 96*  CO2 24  GLUCOSE 103*  BUN 57*  CREATININE 6.64*  CALCIUM 7.6*  PHOS 6.5*   Liver Function Tests: Recent Labs  Lab 12/03/21 0941  ALBUMIN 1.8*   CBC: Recent Labs  Lab 12/07/21 0552  WBC 17.3*  NEUTROABS 14.7*  HGB 7.0*  HCT 23.7*  MCV 79.3*  PLT 365    Studies/Results: VAS [name] LOWER EXTREMITY VENOUS (DVT)  Result Date: 12/07/2021  Lower Venous DVT Study Patient Name:  CLYDINE PARKISON  Date of Exam:   12/07/2021 Medical Rec #: [id number]      Accession #:    [id number] Date of Birth: 12/25/1970       Patient Gender: F Patient Age:   51 years Exam Location:  [facility] Procedure:      VAS [name] LOWER EXTREMITY VENOUS (DVT) Referring Phys: Durel Salts --------------------------------------------------------------------------------   Indications: Pain. Other Indications: Peripheral neuropathy. Risk Factors: DVT 2015 Surgery History of bilateral iliac stents and SFA stents On Dialysis. Comparison Study: History of DVT in left leg in 2015. Performing Technologist: Oda Cogan RDMS, RVT  Examination Guidelines: A complete evaluation includes B-mode imaging, spectral Doppler, color Doppler, and power Doppler as needed of all accessible portions of each vessel. Bilateral testing is considered an integral part of a complete examination. Limited examinations for reoccurring indications may be performed as noted. The reflux portion of the exam is performed with the patient in reverse Trendelenburg.  +---------+---------------+---------+-----------+----------+--------------+ RIGHT    CompressibilityPhasicitySpontaneityPropertiesThrombus Aging +---------+---------------+---------+-----------+----------+--------------+ CFV      Full           Yes      Yes                                 +---------+---------------+---------+-----------+----------+--------------+ SFJ      Full                                                        +---------+---------------+---------+-----------+----------+--------------+  FV Prox  Full                                                        +---------+---------------+---------+-----------+----------+--------------+ FV Mid   Full                                                        +---------+---------------+---------+-----------+----------+--------------+ FV DistalFull                                                        +---------+---------------+---------+-----------+----------+--------------+ PFV      Full                                                        +---------+---------------+---------+-----------+----------+--------------+ POP      Full           Yes      Yes                                  +---------+---------------+---------+-----------+----------+--------------+ PTV      Full                                                        +---------+---------------+---------+-----------+----------+--------------+ PERO     Full                                                        +---------+---------------+---------+-----------+----------+--------------+   +---------+---------------+---------+-----------+----------+--------------+ LEFT     CompressibilityPhasicitySpontaneityPropertiesThrombus Aging +---------+---------------+---------+-----------+----------+--------------+ CFV      Full           Yes      Yes                                 +---------+---------------+---------+-----------+----------+--------------+ SFJ      Full                                                        +---------+---------------+---------+-----------+----------+--------------+ FV Prox  Full                                                        +---------+---------------+---------+-----------+----------+--------------+  FV Mid   Full                                                        +---------+---------------+---------+-----------+----------+--------------+ FV DistalFull                                                        +---------+---------------+---------+-----------+----------+--------------+ PFV      Full                                                        +---------+---------------+---------+-----------+----------+--------------+ POP      Full           Yes      Yes                                 +---------+---------------+---------+-----------+----------+--------------+ PTV      Full                                                        +---------+---------------+---------+-----------+----------+--------------+ PERO     Full                                                         +---------+---------------+---------+-----------+----------+--------------+     Summary: BILATERAL: - No evidence of deep vein thrombosis seen in the lower extremities, bilaterally. -No evidence of popliteal cyst, bilaterally.   *See table(s) above for measurements and observations. Electronically signed by Monica Martinez MD on 12/07/2021 at 12:41:45 PM.    Final     Medications:   aspirin  81 mg Oral Daily   atorvastatin  40 mg Oral Daily   clopidogrel  75 mg Oral Q breakfast   darbepoetin (ARANESP) injection - NON-DIALYSIS  200 mcg Subcutaneous Q Wed-1800   doxercalciferol  7 mcg Intravenous Q M,W,F-HD   DULoxetine  20 mg Oral Daily   heparin  5,000 Units Subcutaneous Q8H   lanthanum  1,000 mg Oral Q supper   leptospermum manuka honey  1 Application Topical Daily   lidocaine  1 patch Transdermal Q24H   metoCLOPramide  10 mg Oral TID AC   metoprolol succinate  12.5 mg Oral Daily   montelukast  10 mg Oral QHS   pantoprazole  40 mg Oral BID   pneumococcal 20-valent conjugate vaccine  0.5 mL Intramuscular Tomorrow-1000   thiamine  100 mg Oral Daily    Dialysis Orders: MWF [facility] 3h 48mn 400/600  61kg  2/2 bath P2  Hep 4000  LUE AVF - mircera 200 mcg IV q2, last 10/16, due 10/30 - venofer '100mg'$  IV tiw thru  11/06 - doxercalciferol 7 ug tiw - sod thiosulfate 25 gm iv tiw   Assessment/Plan: Septic shock - sp course of Zosyn, now resolved -> CIR d/t debility  ESRD: Required CRRT on admit, now back to iHD MWF. On schedule next Tx 11/29: 3.5h AVF, Tight heparin 2L UF  HTN/volume: BP controlled, mostly euvolemic on exam.  Anemia of ESRD: Hgb 7s. Continue max dose Aranesp 230mg weekly while here. No IV Fe with ferritin >5000. Transfuse prn for Hgb<7 Secondary HPTH: CorrCa ok, Phos high. She has trouble tolerating any binder secondary to gastroparesis, trying Fosrenol once daily for now. Nutrition: Alb low, continue supplements sometimes refusing Nepro. Renal diet w/fluid restrictions.   Encephalopathy - D/t shock/meds?, better. Had some hearing loss and foot drop 11/7 now spontaneously improving. No offending meds Myoclonus: severe, after initial consult. Felt due to meds (cefepime, gabapentin and narcotics). Resolved.  Tumoral calcinosis L hip - long-standing painful issue for her, on NaThio empirically as op, although hasn't helped. [facility] Recultured 11/2 - NGTD. T2DM with severe gastroparesis - Ongoing issue, s/p esophageal myotomy 09/17/21. Has issues swallow/keeping pills down. L foot pain - Xray negative, uric acid normal. VVS consulted, s/p LE angiography with improvement in pain. Venous [place]/no evidence of DVT. Leukocytosis - persistent. Per PMD. Dispo: Tentative d/c plan of 12/12/21  LJen Mow PA-C [facility] 12/08/2021,10:18 AM  LOS: 15 days

## 2021-12-08 NOTE — Progress Notes (Signed)
PROGRESS NOTE   Subjective/Complaints: She continues to feel severe neuropathic pain in her left foot along both the dorsum and sole. She does not find the oxycodone as helpful as before.   ROS:  + L foot pain  +left hip pain. +neuropathy in sole and dorsum of left foot. Denies fevers, chills, N/V, abdominal pain, constipation, diarrhea, SOB, cough, chest pain, new weakness or paraesthesias.    Objective:   VAS [name] LOWER EXTREMITY VENOUS (DVT)  Result Date: 12/07/2021  Lower Venous DVT Study Patient Name:  Susan Horton  Date of Exam:   12/07/2021 Medical Rec #: [id number]      Accession #:    [id number] Date of Birth: [birth year]       Patient Gender: F Patient Age:   51 years Exam Location:  [facility] Procedure:      VAS [name] LOWER EXTREMITY VENOUS (DVT) Referring Phys: Durel Salts --------------------------------------------------------------------------------  Indications: Pain. Other Indications: Peripheral neuropathy. Risk Factors: DVT 2015 Surgery History of bilateral iliac stents and SFA stents On Dialysis. Comparison Study: History of DVT in left leg in 2015. Performing Technologist: Oda Cogan RDMS, RVT  Examination Guidelines: A complete evaluation includes B-mode imaging, spectral Doppler, color Doppler, and power Doppler as needed of all accessible portions of each vessel. Bilateral testing is considered an integral part of a complete examination. Limited examinations for reoccurring indications may be performed as noted. The reflux portion of the exam is performed with the patient in reverse Trendelenburg.  +---------+---------------+---------+-----------+----------+--------------+ RIGHT    CompressibilityPhasicitySpontaneityPropertiesThrombus Aging +---------+---------------+---------+-----------+----------+--------------+ CFV      Full           Yes      Yes                                  +---------+---------------+---------+-----------+----------+--------------+ SFJ      Full                                                        +---------+---------------+---------+-----------+----------+--------------+ FV Prox  Full                                                        +---------+---------------+---------+-----------+----------+--------------+ FV Mid   Full                                                        +---------+---------------+---------+-----------+----------+--------------+ FV DistalFull                                                        +---------+---------------+---------+-----------+----------+--------------+  PFV      Full                                                        +---------+---------------+---------+-----------+----------+--------------+ POP      Full           Yes      Yes                                 +---------+---------------+---------+-----------+----------+--------------+ PTV      Full                                                        +---------+---------------+---------+-----------+----------+--------------+ PERO     Full                                                        +---------+---------------+---------+-----------+----------+--------------+   +---------+---------------+---------+-----------+----------+--------------+ LEFT     CompressibilityPhasicitySpontaneityPropertiesThrombus Aging +---------+---------------+---------+-----------+----------+--------------+ CFV      Full           Yes      Yes                                 +---------+---------------+---------+-----------+----------+--------------+ SFJ      Full                                                        +---------+---------------+---------+-----------+----------+--------------+ FV Prox  Full                                                         +---------+---------------+---------+-----------+----------+--------------+ FV Mid   Full                                                        +---------+---------------+---------+-----------+----------+--------------+ FV DistalFull                                                        +---------+---------------+---------+-----------+----------+--------------+ PFV      Full                                                        +---------+---------------+---------+-----------+----------+--------------+  POP      Full           Yes      Yes                                 +---------+---------------+---------+-----------+----------+--------------+ PTV      Full                                                        +---------+---------------+---------+-----------+----------+--------------+ PERO     Full                                                        +---------+---------------+---------+-----------+----------+--------------+     Summary: BILATERAL: - No evidence of deep vein thrombosis seen in the lower extremities, bilaterally. -No evidence of popliteal cyst, bilaterally.   *See table(s) above for measurements and observations. Electronically signed by Monica Martinez MD on 12/07/2021 at 12:41:45 PM.    Final    Recent Labs    12/07/21 0552  WBC 17.3*  HGB 7.0*  HCT 23.7*  PLT 365    No results for input(s): "NA", "K", "CL", "CO2", "GLUCOSE", "BUN", "CREATININE", "CALCIUM" in the last 72 hours.   Intake/Output Summary (Last 24 hours) at 12/08/2021 1146 Last data filed at 12/08/2021 0700 Gross per 24 hour  Intake 120 ml  Output 2000 ml  Net -1880 ml     Pressure Injury 11/15/21 Sacrum Medial Stage 2 -  Partial thickness loss of dermis presenting as a shallow open injury with a red, pink wound bed without slough. (Active)  11/15/21 1030  Location: Sacrum  Location Orientation: Medial  Staging: Stage 2 -  Partial thickness loss of dermis  presenting as a shallow open injury with a red, pink wound bed without slough.  Wound Description (Comments):   Present on Admission: Yes    Physical Exam: Vital Signs Blood pressure (!) 116/48, pulse 94, temperature 99.3 F (37.4 C), temperature source Oral, resp. rate 18, height '5\' 5"'$  (1.651 m), weight 59.4 kg, SpO2 96 %.  Gen: no distress, normal appearing, BMI 23.3 HEENT: oral mucosa pink and moist, NCAT Cardio: RRR, no MRG Chest: normal effort, normal rate of breathing Abd: soft, non-distended Ext: trace LE edema b/l, left UE AVF + bruit Psych: pleasant, normal affect Musculoskeletal:     Cervical back: Normal range of motion.     Comments: Left hip tender with ROM, appears swollen. Large dressing in place. Left foot hypersensitive to touch.  Skin:    Comments:  Left hip Xeroform dressing in place.  2 incisions left posterior lateral thigh without odor but with yellowish/tan drainage with clumpy, white debris. No odor. Wounds themselves appear pink without necrotic tissue.  Left foot: tip of great toe and 2nd/third toes appear ischemic. AVF in LUE  Stage 2 to sacrum Neurological:     Mental Status: She is alert.     Comments:    Alert and oriented x 3. Follows commands, Normal insight and awareness. Intact Memory. Normal language and speech. CN 2-12 grossly intact. UE grossly 5/5. RLE 3+ to 4/5 prox  to 4+/5 distally. LLE 2/5 prox to trace/5 ankle. Stocking glove sensory loss bilateral LE, L>R up to mid calf.   Unable to stand today due to pain in left foot Psychiatric:        Mood and Affect: Mood normal.    Flat affect      Behavior: Behavior normal.    Assessment/Plan: 1. Functional deficits which require 3+ hours per day of interdisciplinary therapy in a comprehensive inpatient rehab setting. Physiatrist is providing close team supervision and 24 hour management of active medical problems listed below. Physiatrist and rehab team continue to assess barriers to  discharge/monitor patient progress toward functional and medical goals  Care Tool:  Bathing    Body parts bathed by patient: Right arm, Left arm, Chest, Abdomen, Front perineal area, Right upper leg, Left upper leg, Right lower leg, Left lower leg, Face, Buttocks   Body parts bathed by helper: Buttocks     Bathing assist Assist Level: Contact Guard/Touching assist Assistive Device Comment: stand pivot, shower chair   Upper Body Dressing/Undressing Upper body dressing   What is the patient wearing?: Pull over shirt    Upper body assist Assist Level: Independent with assistive device Assistive Device Comment: Seated on WC  Lower Body Dressing/Undressing Lower body dressing      What is the patient wearing?: Underwear/pull up, Pants     Lower body assist Assist for lower body dressing: Minimal Assistance - Patient > 75%     Toileting Toileting    Toileting assist Assist for toileting: Maximal Assistance - Patient 25 - 49%     Transfers Chair/bed transfer  Transfers assist     Chair/bed transfer assist level: Minimal Assistance - Patient > 75% (slideboard)     Locomotion Ambulation   Ambulation assist   Ambulation activity did not occur: Safety/medical concerns (pain, fatigue, weakness/deconditioning, decreased balance)  Assist level: Contact Guard/Touching assist Assistive device: Walker-rolling Max distance: 10   Walk 10 feet activity   Assist  Walk 10 feet activity did not occur: Safety/medical concerns (pain, fatigue, weakness/deconditioning, decreased balance)  Assist level: Contact Guard/Touching assist Assistive device: Walker-rolling   Walk 50 feet activity   Assist Walk 50 feet with 2 turns activity did not occur: Safety/medical concerns (pain, fatigue, weakness/deconditioning, decreased balance)         Walk 150 feet activity   Assist Walk 150 feet activity did not occur: Safety/medical concerns (pain, fatigue,  weakness/deconditioning, decreased balance)         Walk 10 feet on uneven surface  activity   Assist Walk 10 feet on uneven surfaces activity did not occur: Safety/medical concerns (pain, fatigue, weakness/deconditioning, decreased balance)         Wheelchair     Assist Is the patient using a wheelchair?: Yes Type of Wheelchair: Manual    Wheelchair assist level: Supervision/Verbal cueing Max wheelchair distance: 50    Wheelchair 50 feet with 2 turns activity    Assist        Assist Level: Supervision/Verbal cueing   Wheelchair 150 feet activity     Assist      Assist Level: Supervision/Verbal cueing   Blood pressure (!) 116/48, pulse 94, temperature 99.3 F (37.4 C), temperature source Oral, resp. rate 18, height '5\' 5"'$  (1.651 m), weight 59.4 kg, SpO2 96 %.    Medical Problem List and Plan: 1. Functional deficits secondary to septic shock/metabolic with debility and encephalopathy. Pt appears cognitively to be at baseline. -  Antibiotic therapy 10/29 -  11/6             -patient may shower if left hip is covered             -ELOS/Goals: 10-12 days, mod I to supervision goals  Continue CIR, continue PT/OT  -Seen by neuropsychology  working on coping/adjustment  2.  Impaired mobility: working on sit to stands. Provided accomodation letter for ramp and bathroom. -DVT/anticoagulation:  Pharmaceutical: continue Heparin given bleeding risk - intermittently refusing, educated patient on need 11/25. Discussed SCDs as an alternative.               -antiplatelet therapy: Aspirin 81 mg daily and Plavix 75 mg daily 3. Left foot pain, Chronic periopheral polyneuropathy (likely d/t DM 2): changed Lidoderm patch to bottom of right foot, oxycodone as needed. Provided list of foods for pain relief. Manuka honey ordered for wound healing. Avoid gabapentin since causes confusion. Increase Cymbalta to '30mg'$  daily. Ordered MRI of left foot.   4. Mood/Behavior/Sleep:  Provide emotional support             -antipsychotic agents: N/A 5. Neuropsych/cognition: This patient is capable of making decisions on her own behalf. 6. Left hip incision: Continue BID to TID dressing changes to left hip. Discussed with patient that surgeon stated continued drainage is to be expected. Apply manuka honey.  -WOC follow-up for left hip drainage. -completed course of broad spectrum abx on 11/6   -Assistance provided by orthopedics. 7. Fluids/Electrolytes/Nutrition: Routine in and outs with follow-up chemistries 8.  Peripheral vascular disease.  Follow-up vascular surgery for occluded stents status post right common iliac left common iliac stenting with balloon angioplasty left common femoral and stent left SFA 11/16/2021 per Dr. [name] 9.  Tumoral calcinosis.  Recent excision of tumoral calcinosis [facility] hip [facility] at [facility] 10/20/2021.  Weightbearing as tolerated. Communicated MRI results to ortho 10.  End-stage renal disease.  Continue hemodialysis as directed. Discussed improved Creatinine to 3.08 on 11/14.  11.  Acute on chronic anemia.  Continue iron supplement 12.  Leukocytosis.  (20k)  -Broad spectrum antibiotic therapy completed 11/16/2021 and monitor.  Latest blood culture showed no growth             -appearance of drainage idiscussed with ortho and is to be expected. Recent CT (10/30) demonstrating changes c/w tumoral calcinosis and mild cellulitis.              -MRI hip obtianed and stable              -ischemic toes LLE could also be a source             -continue wound care as above             -pt feels well otherwise and no other signs of infection             -consider H/O consult if persistent  -WBC yesterday was 21.8, recheck ordered for Monday  13.  Asthma without exacerbation as well as remote history of tobacco abuse.  Continue inhalers as directed check oxygen saturations every shift 14. Diet controlled Diabetes mellitus. Blood sugar checks  discontinued 15.  Hypertension.  Toprol-XL 25 mg daily.  Monitor with increased mobility -11/24 well controlled, monitor    12/08/2021    4:54 AM 12/07/2021    9:27 PM 12/07/2021    9:01 PM  Vitals with BMI  Weight 130 lbs 15 oz  137 lbs 9 oz  BMI 21.79  84.53  Systolic 646 803   Diastolic 48 47   Pulse 94 94     16.  Abrupt onset bilateral sensorineural hearing loss.  Confirmed by audiology 11/7 the symptoms have resolved.  Follow-up outpatient. 17.  Diastolic and systolic congestive heart failure.  Monitor for any signs of fluid overload. Daily weights ordered Filed Weights   12/07/21 1557 12/07/21 2101 12/08/21 0454  Weight: 64.2 kg 62.4 kg 59.4 kg  Weight overall stable, HD today  18. Pruritus: sarna lotion ordered.  19. Diarrhea: continue imodium  -11/23 Reports BM 2 days ago, order miralax prN in case constipation develops  -11/24 continue miralax prn-she would like to take medication after dialysis completed. She says she will ask for it later. Will add lactulose PRN for more severe constipation            - 11/25: Last recorded BM 11/17; took PRN miralax last night without results; per patient last BM 11/20. Ordered to give PRN lactulose tonight. +passing flatus  20. Insomnia: secondary to diarrhea, continue imodium.  21. Dysuria: UA/UC ordered -11/25 will stop diflucan   22. Chronic nausea  -Continue Zofran PRN  -Suspect GERD may be contributing, increase PPI to BID  -11/23 a little improved Monitor response to increased PPI frequency 23. GERD  -increase pantoprazole to BID   LOS: 15 days A FACE TO FACE EVALUATION WAS PERFORMED  Susan Horton 12/08/2021, 11:46 AM

## 2021-12-09 ENCOUNTER — Inpatient Hospital Stay (HOSPITAL_COMMUNITY): Payer: Medicare Other

## 2021-12-09 LAB — RENAL FUNCTION PANEL
Albumin: 2 g/dL — ABNORMAL LOW (ref 3.5–5.0)
Anion gap: 20 — ABNORMAL HIGH (ref 5–15)
BUN: 43 mg/dL — ABNORMAL HIGH (ref 6–20)
CO2: 27 mmol/L (ref 22–32)
Calcium: 8 mg/dL — ABNORMAL LOW (ref 8.9–10.3)
Chloride: 88 mmol/L — ABNORMAL LOW (ref 98–111)
Creatinine, Ser: 5.05 mg/dL — ABNORMAL HIGH (ref 0.44–1.00)
GFR, Estimated: 10 mL/min — ABNORMAL LOW (ref >60)
Glucose, Bld: 80 mg/dL (ref 70–99)
Phosphorus: 6.7 mg/dL — ABNORMAL HIGH (ref 2.5–4.6)
Potassium: 4.4 mmol/L (ref 3.5–5.1)
Sodium: 135 mmol/L (ref 135–145)

## 2021-12-09 LAB — CBC WITH DIFFERENTIAL/PLATELET
Abs Immature Granulocytes: 0.1 10*3/uL — ABNORMAL HIGH (ref 0.00–0.07)
Basophils Absolute: 0.1 10*3/uL (ref 0.0–0.1)
Basophils Relative: 0 %
Eosinophils Absolute: 0.1 10*3/uL (ref 0.0–0.5)
Eosinophils Relative: 1 %
HCT: 28.7 % — ABNORMAL LOW (ref 36.0–46.0)
Hemoglobin: 8.5 g/dL — ABNORMAL LOW (ref 12.0–15.0)
Immature Granulocytes: 1 %
Lymphocytes Relative: 6 %
Lymphs Abs: 0.9 10*3/uL (ref 0.7–4.0)
MCH: 23.4 pg — ABNORMAL LOW (ref 26.0–34.0)
MCHC: 29.6 g/dL — ABNORMAL LOW (ref 30.0–36.0)
MCV: 78.8 fL — ABNORMAL LOW (ref 80.0–100.0)
Monocytes Absolute: 0.7 10*3/uL (ref 0.1–1.0)
Monocytes Relative: 5 %
Neutro Abs: 12.6 10*3/uL — ABNORMAL HIGH (ref 1.7–7.7)
Neutrophils Relative %: 87 %
Platelets: 450 10*3/uL — ABNORMAL HIGH (ref 150–400)
RBC: 3.64 MIL/uL — ABNORMAL LOW (ref 3.87–5.11)
RDW: 18.8 % — ABNORMAL HIGH (ref 11.5–15.5)
WBC: 14.4 10*3/uL — ABNORMAL HIGH (ref 4.0–10.5)
nRBC: 0 % (ref 0.0–0.2)

## 2021-12-09 MED ORDER — PENTAFLUOROPROP-TETRAFLUOROETH EX AERO: 1.0000 | INHALATION_SPRAY | CUTANEOUS | Status: DC | PRN

## 2021-12-09 MED ORDER — MELATONIN 5 MG PO TABS
5.0000 mg | ORAL_TABLET | Freq: Every day | ORAL | Status: DC
  Administered 2021-12-09 – 2021-12-14 (×6): 5 mg via ORAL
  Filled 2021-12-09 (×6): qty 1

## 2021-12-09 MED ORDER — LIDOCAINE-PRILOCAINE 2.5-2.5 % EX CREA: 1.0000 | TOPICAL_CREAM | CUTANEOUS | Status: DC | PRN

## 2021-12-09 MED ORDER — LIDOCAINE HCL (PF) 1 % IJ SOLN: 5.0000 mL | INTRAMUSCULAR | Status: DC | PRN

## 2021-12-09 MED ORDER — HYDROMORPHONE HCL 2 MG PO TABS
1.0000 mg | ORAL_TABLET | ORAL | Status: DC | PRN
  Administered 2021-12-09 – 2021-12-14 (×7): 1 mg via ORAL
  Filled 2021-12-09 (×8): qty 1

## 2021-12-09 MED ORDER — HEPARIN SODIUM (PORCINE) 1000 UNIT/ML DIALYSIS
20.0000 [IU]/kg | INTRAMUSCULAR | Status: DC | PRN
  Administered 2021-12-09: 1200 [IU] via INTRAVENOUS_CENTRAL

## 2021-12-09 MED ORDER — OXYCODONE HCL 5 MG PO TABS
10.0000 mg | ORAL_TABLET | ORAL | Status: DC | PRN
  Administered 2021-12-09 – 2021-12-12 (×4): 10 mg via ORAL
  Filled 2021-12-09 (×5): qty 2

## 2021-12-09 MED ORDER — DULOXETINE HCL 20 MG PO CPEP
40.0000 mg | ORAL_CAPSULE | Freq: Every day | ORAL | Status: DC
  Administered 2021-12-10 – 2021-12-14 (×5): 40 mg via ORAL
  Filled 2021-12-09 (×6): qty 2

## 2021-12-09 NOTE — Progress Notes (Signed)
Physical Therapy Session Note  Patient Details  Name: Susan Horton MRN: [id number] Date of Birth: 1970-04-28  Today's Date: 12/09/2021 PT Individual Time: 0730-0810 PT Individual Time Calculation (min): 40 min   Today's Date: 12/09/2021 PT Missed Time: 53 Minutes Missed Time Reason: Patient fatigue;Pain  Short Term Goals: Week 1:  PT Short Term Goal 1 (Week 1): pt will transfer sit<>stand with LRAD and mod A consistantly PT Short Term Goal 1 - Progress (Week 1): Met PT Short Term Goal 2 (Week 1): pt will transfer bed<>chair with LRAD and min A PT Short Term Goal 2 - Progress (Week 1): Met PT Short Term Goal 3 (Week 1): pt will ambulate 62f with LRAD and mod A PT Short Term Goal 3 - Progress (Week 1): Met Week 2:  PT Short Term Goal 1 (Week 2): pt will transfer sit<>stand with LRAD and consistant SUP PT Short Term Goal 2 (Week 2): pt will transfer bed<>chair with LRAD and consistant SUP PT Short Term Goal 3 (Week 2): pt will ambulate 766fwith LRAD and SUP PT Short Term Goal 4 (Week 2): Pt will initiate stair training.  Skilled Therapeutic Interventions/Progress Updates:   Received pt supine in bed asleep, upon wakening pt agreeable to PT treatment, but reported continued pain rated 9/10 in bilateral feet/legs. Pt declined any pain medication initially and RN arrived to administer insulin injection. Session with emphasis on functional mobility/transfers, generalized strengthening and endurance, and standing tolerance. Pt transferred semi-reclined<>sitting EOB with HOB elevated and use of bedrails with mod A this morning and significantly increased time due to pain. Pt required use of UEs to move LEs to EOB as well as mod A from therapist for LE management and to scoot to EOB - strongly recommended pain medication as pt very limited with mobility this morning and pt agreed - RN notified. Pt transferred bed<>WC via slideboard to R with mod A overall and total A to place board. Pt unable to  tolerate any pressure through her feet during transfer. Sat in WC at sink and washed upper body and face and brushed teeth with set up assist while therapist notified treatment team of concerns with pt's recent increase in neuropathic pain and amount of physical assist required. Of note, pt reported fatigue and eyes appeared heavy and pt required frequent rest breaks throughout session. Pt requested to rest and "save energy" for upcoming therapy sessions prior to dialysis this afternoon. [facility] needs within reach and RN present at bedside attending to care. 35 minutes missed of skilled physical therapy due to pain and fatigue.   Therapy Documentation Precautions:  Precautions Precautions: Fall Precaution Comments: L foot drop, bilateral intermittent hearing loss Restrictions Weight Bearing Restrictions: No  Therapy/Group: Individual Therapy AnAlfonse AlpersT, DPT  12/09/2021, 6:49 AM

## 2021-12-09 NOTE — Progress Notes (Signed)
Occupational Therapy Session Note  Patient Details  Name: Susan Horton MRN: [id number] Date of Birth: February 17, 1970  Today's Date: 12/09/2021 OT Individual Time: 0920-1030 OT Individual Time Calculation (min): 70 min    Short Term Goals: Week 3:  OT Short Term Goal 1 (Week 3): STG = LTG due to ELOS  Skilled Therapeutic Interventions/Progress Updates:  Skilled OT intervention completed with focus on pain management, BLE positioning in w/c, BUE AROM/strengthening. Pt received seated in w/c, agreeable to session. 10/10 pain reported in BLE, pt having just received pain meds. Therapist offered rest breaks and repositioning throughout for pain reduction as well as modifications.  Pt noticeably in discomfort with RLE positioning on leg rest with foot/ankle remaining in plantar flexion and lateral deviation. Per application of support via towels on LLE leg rest, therapist offered similar set up on RLE due to increase of sensitivity now on RLE with pt willing to try. With use of coban and towels, therapist donned to calf pad and foot rest, though with elevation of BLE pt was unable to tolerate dorsiflexion. To maintain elevation for edema management and comfort therapist donned pillows in addition for calf support with pt then able to tolerate dorsiflexion while seated and remained with this position during session at the seated level.  Transported dependently in w/c <> gym.  Seated at table top, pt completed the following to address BUE endurance, alertness, cognitive sequencing, all to promote independence with BADLs: (Utilized R and B music per pt's choice, to increase arousal during tasks which pt benefited from) -removal/placement of suctioned squigz from table top <> vertical surface board -Donned 1.5 lb wrist weights to each wrist then placement/removal of peg design following a moderate level design; several mistakes made with max cues needed to attend to correcting and locating the mistakes,  however pt reported that she needs to get glasses, but suspect some is related to lethargy  Back in room, pt remained seated in w/c, with chair pad alarm on/activated, and with all needs in reach at end of session.   Therapy Documentation Precautions:  Precautions Precautions: Fall Precaution Comments: L foot drop, bilateral intermittent hearing loss Restrictions Weight Bearing Restrictions: No    Therapy/Group: Individual Therapy  Blase Mess, MS, OTR/L  12/09/2021, 10:31 AM

## 2021-12-09 NOTE — Progress Notes (Addendum)
PROGRESS NOTE   Subjective/Complaints: Continues to have severe pain in left foot and is unable to stand. Discussed that MRI shows denervation, myositis, and will increase Cymbalta  ROS:  + L foot pain  +left hip pain. +neuropathy in sole and dorsum of left foot. Denies fevers, chills, abdominal pain, constipation, diarrhea, SOB, cough, chest pain, new weakness or paraesthesias.  +chronic nausea- stable  Objective:   MR FOOT LEFT WO CONTRAST  Result Date: 12/08/2021 CLINICAL DATA:  Plantar left foot pain EXAM: MRI OF THE LEFT FOOT WITHOUT CONTRAST TECHNIQUE: Multiplanar, multisequence MR imaging of the left forefoot was performed. No intravenous contrast was administered. COMPARISON:  X-ray 09/15/2012 FINDINGS: Bones/Joint/Cartilage No acute fracture. No dislocation. No erosion. Mild degenerative changes of the first MTP joint and IP joints of the toes. No focal site of bone marrow edema. No marrow replacement. Ligaments Intact Lisfranc ligament.  Intact collateral ligaments. Muscles and Tendons Diffuse edema-like signal throughout the foot musculature, which may reflect changes related to denervation versus myositis. No intramuscular fluid collection. Intact flexor and extensor tendons without tendinosis, tear, or tenosynovitis. Soft tissues Mild generalized soft tissue edema. No organized fluid collection. No soft tissue wound or ulceration. No intermetatarsal space abnormality. IMPRESSION: 1. No acute osseous abnormality of the left forefoot. 2. Diffuse edema-like signal throughout the foot musculature, which may reflect changes related to denervation versus myositis. 3. Mild generalized soft tissue edema. No organized fluid collection. 4. Mild degenerative changes of the left foot. Electronically Signed   By: Davina Poke D.O.   On: 12/08/2021 15:08   Recent Labs    12/07/21 0552  WBC 17.3*  HGB 7.0*  HCT 23.7*  PLT 365    No  results for input(s): "NA", "K", "CL", "CO2", "GLUCOSE", "BUN", "CREATININE", "CALCIUM" in the last 72 hours.   Intake/Output Summary (Last 24 hours) at 12/09/2021 1001 Last data filed at 12/09/2021 0700 Gross per 24 hour  Intake 240 ml  Output --  Net 240 ml     Pressure Injury 11/15/21 Sacrum Medial Stage 2 -  Partial thickness loss of dermis presenting as a shallow open injury with a red, pink wound bed without slough. (Active)  11/15/21 1030  Location: Sacrum  Location Orientation: Medial  Staging: Stage 2 -  Partial thickness loss of dermis presenting as a shallow open injury with a red, pink wound bed without slough.  Wound Description (Comments):   Present on Admission: Yes    Physical Exam: Vital Signs Blood pressure (!) 106/46, pulse 79, temperature 98.5 F (36.9 C), temperature source Oral, resp. rate 16, height '5\' 5"'$  (1.651 m), weight 58 kg, SpO2 98 %.  Gen: no distress, normal appearing, BMI 23.3 HEENT: oral mucosa pink and moist, NCAT, impaired hearing Cardio: RRR, no MRG Chest: normal effort, normal rate of breathing Abd: soft, non-distended Ext: trace LE edema b/l, left UE AVF + bruit Psych: pleasant, normal affect Musculoskeletal:     Cervical back: Normal range of motion.     Comments: Left hip tender with ROM, appears swollen. Large dressing in place. Left foot hypersensitive to touch.  Skin:    Comments:  Left hip Xeroform dressing in place.  2 incisions left posterior lateral thigh without odor but with yellowish/tan drainage with clumpy, white debris. No odor. Wounds themselves appear pink without necrotic tissue.  Left foot: tip of great toe and 2nd/third toes appear ischemic. AVF in LUE  Stage 2 to sacrum Neurological:     Mental Status: Susan Horton is alert.     Comments:    Alert and oriented x 3. Follows commands, Normal insight and awareness. Intact Memory. Normal language and speech. CN 2-12 grossly intact. UE grossly 5/5. RLE 3+ to 4/5 prox to 4+/5  distally. LLE 2/5 prox to trace/5 ankle. Stocking glove sensory loss bilateral LE, L>R up to mid calf.   Unable to stand today due to pain in left foot Left 2nd and 3rd digits appear necrotic with decreased pulses Psychiatric:        Mood and Affect: Mood normal.    Flat affect      Behavior: Behavior normal.    Assessment/Plan: 1. Functional deficits which require 3+ hours per day of interdisciplinary therapy in a comprehensive inpatient rehab setting. Physiatrist is providing close team supervision and 24 hour management of active medical problems listed below. Physiatrist and rehab team continue to assess barriers to discharge/monitor patient progress toward functional and medical goals  Care Tool:  Bathing    Body parts bathed by patient: Right arm, Left arm, Chest, Abdomen, Front perineal area, Right upper leg, Left upper leg, Right lower leg, Left lower leg, Face, Buttocks   Body parts bathed by helper: Buttocks     Bathing assist Assist Level: Contact Guard/Touching assist Assistive Device Comment: stand pivot, shower chair   Upper Body Dressing/Undressing Upper body dressing   What is the patient wearing?: Pull over shirt    Upper body assist Assist Level: Independent with assistive device Assistive Device Comment: Seated on WC  Lower Body Dressing/Undressing Lower body dressing      What is the patient wearing?: Underwear/pull up, Pants     Lower body assist Assist for lower body dressing: Minimal Assistance - Patient > 75%     Toileting Toileting    Toileting assist Assist for toileting: Maximal Assistance - Patient 25 - 49%     Transfers Chair/bed transfer  Transfers assist     Chair/bed transfer assist level: Minimal Assistance - Patient > 75% (slideboard)     Locomotion Ambulation   Ambulation assist   Ambulation activity did not occur: Safety/medical concerns (pain, fatigue, weakness/deconditioning, decreased balance)  Assist level:  Contact Guard/Touching assist Assistive device: Walker-rolling Max distance: 10   Walk 10 feet activity   Assist  Walk 10 feet activity did not occur: Safety/medical concerns (pain, fatigue, weakness/deconditioning, decreased balance)  Assist level: Contact Guard/Touching assist Assistive device: Walker-rolling   Walk 50 feet activity   Assist Walk 50 feet with 2 turns activity did not occur: Safety/medical concerns (pain, fatigue, weakness/deconditioning, decreased balance)         Walk 150 feet activity   Assist Walk 150 feet activity did not occur: Safety/medical concerns (pain, fatigue, weakness/deconditioning, decreased balance)         Walk 10 feet on uneven surface  activity   Assist Walk 10 feet on uneven surfaces activity did not occur: Safety/medical concerns (pain, fatigue, weakness/deconditioning, decreased balance)         Wheelchair     Assist Is the patient using a wheelchair?: Yes Type of Wheelchair: Manual    Wheelchair assist level: Supervision/Verbal cueing Max wheelchair distance: 50    Wheelchair  50 feet with 2 turns activity    Assist        Assist Level: Supervision/Verbal cueing   Wheelchair 150 feet activity     Assist      Assist Level: Supervision/Verbal cueing   Blood pressure (!) 106/46, pulse 79, temperature 98.5 F (36.9 C), temperature source Oral, resp. rate 16, height '5\' 5"'$  (1.651 m), weight 58 kg, SpO2 98 %.    Medical Problem List and Plan: 1. Functional deficits secondary to septic shock/metabolic with debility and encephalopathy. Pt appears cognitively to be at baseline. -  Antibiotic therapy 10/29 - 11/6             -patient may shower if left hip is covered             -ELOS/Goals: 10-12 days, mod I to supervision goals  Continue CIR, continue PT/OT  -Seen by neuropsychology  working on coping/adjustment  2.  Impaired mobility: working on sit to stands. Provided accomodation letter for ramp  and bathroom. -DVT/anticoagulation:  Pharmaceutical: continue Heparin given bleeding risk - intermittently refusing, educated patient on need 11/25. Discussed SCDs as an alternative.               -antiplatelet therapy: Aspirin 81 mg daily and Plavix 75 mg daily 3. Left foot pain, Chronic periopheral polyneuropathy (likely d/t DM 2): changed Lidoderm patch to bottom of right foot, oxycodone as needed. Provided list of foods for pain relief. Manuka honey ordered for wound healing. Avoid gabapentin since causes confusion. Increase Cymbalta to '30mg'$  daily. Discussed that MRI shows edema/myositis/denervation  4. Insomnia: increase melatonin to '5mg'$  5. Neuropsych/cognition: This patient is capable of making decisions on her own behalf. 6. Left hip incision: Continue BID to TID dressing changes to left hip. Discussed with patient that surgeon stated continued drainage is to be expected. Apply manuka honey.  -WOC follow-up for left hip drainage. -completed course of broad spectrum abx on 11/6   -Assistance provided by orthopedics. 7. Fluids/Electrolytes/Nutrition: Routine in and outs with follow-up chemistries 8.  Peripheral vascular disease.  Follow-up vascular surgery for occluded stents status post right common iliac left common iliac stenting with balloon angioplasty left common femoral and stent left SFA 11/16/2021 per Dr. [name] 9.  Tumoral calcinosis.  Recent excision of tumoral calcinosis left hip thigh at [facility] 10/20/2021.  Weightbearing as tolerated. Communicated MRI results to ortho 10.  End-stage renal disease.  Continue hemodialysis as directed. Discussed improved Creatinine to 3.08 on 11/14.  11.  Acute on chronic anemia.  Continue iron supplement 12.  Leukocytosis.  (20k)  -Broad spectrum antibiotic therapy completed 11/16/2021 and monitor.  Latest blood culture showed no growth             -appearance of drainage idiscussed with ortho and is to be expected. Recent CT (10/30)  demonstrating changes c/w tumoral calcinosis and mild cellulitis.              -MRI hip obtianed and stable              -ischemic toes LLE could also be a source             -continue wound care as above             -pt feels well otherwise and no other signs of infection             -consider H/O consult if persistent  -WBC yesterday was 21.8, recheck ordered for Monday  13.  Asthma without exacerbation as well as remote history of tobacco abuse.  Continue inhalers as directed check oxygen saturations every shift 14. Diet controlled Diabetes mellitus. Blood sugar checks discontinued 15.  Hypertension.  Toprol-XL 25 mg daily.  Monitor with increased mobility -11/24 well controlled, monitor    12/09/2021    5:00 AM 12/08/2021    7:57 PM 12/08/2021    3:20 PM  Vitals with BMI  Weight 127 lbs 14 oz    BMI 94.80    Systolic 165 537 482  Diastolic 46 50 51  Pulse 79 87 88    16.  Abrupt onset bilateral sensorineural hearing loss.  Confirmed by audiology 11/7 the symptoms have resolved.  Follow-up outpatient. 17.  Diastolic and systolic congestive heart failure.  Monitor for any signs of fluid overload. Daily weights ordered Filed Weights   12/07/21 2101 12/08/21 0454 12/09/21 0500  Weight: 62.4 kg 59.4 kg 58 kg  Weight overall stable/decreased  18. Pruritus: sarna lotion ordered.  19. Diarrhea: continue imodium  -11/23 Reports BM 2 days ago, order miralax prN in case constipation develops  -11/24 continue miralax prn-Susan Horton would like to take medication after dialysis completed. Susan Horton says Susan Horton will ask for it later. Will add lactulose PRN for more severe constipation            - 11/25: Last recorded BM 11/17; took PRN miralax last night without results; per patient last BM 11/20. Ordered to give PRN lactulose tonight. +passing flatus  20. Insomnia: secondary to diarrhea, continue imodium.  21. Dysuria: UA/UC ordered -11/25 will stop diflucan   22. Chronic nausea  -Continue Zofran  PRN  -Suspect GERD may be contributing, increase PPI to BID  -11/23 a little improved Monitor response to increased PPI frequency 23. GERD  -increase pantoprazole to BID 24. Necrotic left 2nd and 3rd digits of left foot with decreased pulses: consulted vascular to ensure stents are patent. Will add stronger pain medication for her after dialysis today, may need to hold therapy tomorrow-discussed with team   LOS: 16 days [place] 12/09/2021, 10:01 AM

## 2021-12-09 NOTE — Plan of Care (Signed)
Wound Plan   Braden Score: 13  Sensory: 3  Moisture: 3  Activity: 2  Mobility: 2  Nutrition: 2  Friction: 1   Wounds present:   Sacral Wound 2 small open areas to right buttocks Left thigh Left foot (necrotic toes)  Interventions:   Medihoney- to slough areas Weekly reassessment Left AFO Vascular consult Wound care to left hip per MD orders Order Dietary Consult to boost wound healing Roho cushion   Contributors:  Tacy Learn RN [address] [place], [place]

## 2021-12-09 NOTE — Patient Care Conference (Signed)
Inpatient RehabilitationTeam Conference and Plan of Care Update Date: 12/09/2021   Time: 11:31 AM    Patient Name: Susan Horton      Medical Record Number: [id number]  Date of Birth: Jul 12, 1970 Sex: Female         Room/Bed: 4M05C/4M05C-01 Payor Info: Payor: MEDICARE / Plan: MEDICARE PART A AND B / Product Type: *No Product type* /    Admit Date/Time:  11/23/2021 10:54 PM  Primary Diagnosis:  Sepsis [facility])  Hospital Problems: Principal Problem:   Sepsis ([place]) Active Problems:   Chronic combined systolic and diastolic congestive heart failure ([place])   Acute metabolic encephalopathy    Expected Discharge Date: Expected Discharge Date: 12/19/21  Team Members Present: Physician leading conference: Dr. Leeroy Cha Social Worker Present: Erlene Quan, BSW Nurse Present: Tacy Learn, RN PT Present: Ginnie Smart, PT OT Present: Jennefer Bravo, OT PPS Coordinator present : Gunnar Fusi, SLP     Current Status/Progress Goal Weekly Team Focus  Bowel/Bladder   Patient is a renal and anuric, continent of bowel    Remain continent x 2    Assess toileting needs    Swallow/Nutrition/ Hydration               ADL's   Set up A UB, Supervision LB at the shower seated level, CGA toileting. Varies with sit > stand during ADLs 2/2 fatigue/pain in BLE   Mod I   general strengthening, balance, sit > stand powering up and glute activiation, family edu    Mobility   bed mobility supervision, transfers from elevated surfaces CGA/close supervision but can be min A from low surfaces, gait 77f with RW and CGA/supervision - recently limited by increased bilateral foot pain   supervision, mod I WC mobility, min A steps  functional mobility/transfers, slideboard transfers, pain management, dynamic standing balance/coordination, generalized strengthening and endurance, and gait training.    Communication                Safety/Cognition/ Behavioral Observations                Pain   Pain to bilateral legd 9-9/10 verbalize some relieve after prn medication 6-7/10    Pain less than 4 out of 10 with scheduled and prn medications  Assess Pain Q4 hrs and prn, continue schedule meds for pain    Skin    Incision to left thigh is dehisced with moderate amount of drainage. Several abrasions to buttocks.  Open abscess to buttocks.    Prevention of infectiom and any additional breakdown.   Assess every shift and prn      Discharge Planning:  Discharging home with sistetr and 2 daughters to assist. Sister completed education. Barrier: Ramp and bathroom set being complete before d/c   Team Discussion: Sepsis. Continent x 2. Dialysis M/W/F. Uncontrolled pain to left foot. Vascular consult pending. Moderate amount of drainage from incision to left thigh requiring dressing changes twice a shift. Open sacral abscess with abrasions to right buttocks. Cymbalta started to manage increased pain. Decline in therapies. Was close to MOD I and ambulating. Currently bed level with ADLs and SB transfers. Discharge date extended.  Patient on target to meet rehab goals: no, patient declining.   *See Care Plan and progress notes for long and short-term goals.   Revisions to Treatment Plan:  Medication adjustments, Vascular consult, discharge date extended  Teaching Needs: Medications, safety, transfer training, skin/wound care, etc.   Current Barriers to Discharge: Decreased caregiver support, Home  enviroment access/layout, Wound care, Hemodialysis, and Weight bearing restrictions  Possible Resolutions to Barriers: Family education, nursing education, order recommended DME     Medical Summary Current Status: left foot neuropathic pain, left foot myositis/denervation/edema, impaired cognition, hypersensitivity to right foot, left hip wound, leukocytosis  Barriers to Discharge: Medical stability;Uncontrolled Pain;Renal Insufficiency/Failure;Complicated Wound  Barriers to  Discharge Comments: left foot neuropathic pain, left foot myositis/denervation/edema, impaired cognition, hypersensitivity to right foot, left hip wound, leukocytosis Possible Resolutions to [employer] Focus: will consult ortho, cymblata increased, MRI obtained and results discussed with patient, continue oxycodone, continue to monitor left hip wound   Continued Need for Acute Rehabilitation Level of Care: The patient requires daily medical management by a physician with specialized training in physical medicine and rehabilitation for the following reasons: Direction of a multidisciplinary physical rehabilitation program to maximize functional independence : Yes Medical management of patient stability for increased activity during participation in an intensive rehabilitation regime.: Yes Analysis of laboratory values and/or radiology reports with any subsequent need for medication adjustment and/or medical intervention. : Yes   I attest that I was present, lead the team conference, and concur with the assessment and plan of the team.   Ernest Pine 12/09/2021, 2:32 PM

## 2021-12-09 NOTE — Progress Notes (Signed)
Physical Therapy Session Note  Patient Details  Name: Susan Horton MRN: [id number] Date of Birth: 12-02-1970  Today's Date: 12/09/2021 PT Individual Time: 0845-0900 PT Individual Time Calculation (min): 15 min   Short Term Goals: Week 2:  PT Short Term Goal 1 (Week 2): pt will transfer sit<>stand with LRAD and consistant SUP PT Short Term Goal 2 (Week 2): pt will transfer bed<>chair with LRAD and consistant SUP PT Short Term Goal 3 (Week 2): pt will ambulate 8f with LRAD and SUP PT Short Term Goal 4 (Week 2): Pt will initiate stair training.   Skilled Therapeutic Interventions/Progress Updates:      Pt seen for missed time - pt in agreement to therapy session but requests light activity due to fatigue and high neuropathic pain in her feet. Reports she received pain medication earlier. Pt with flat affect and she reports being frustrated because she's been doing so well in therapy before her pain became intense. MD aware of patient's pain.   Pt transported in wc to ortho rehab gym. Setup on UE ergometer at wheelchair level. Pt very tender to touch to BLE for adjustments on UE ergometer. L1 resistance - completed 4x1 minute with rest breaks b/w sets. Slow cadence/speed and effortful.  Returned to her room at end of session. Remained seated in w/c with call bell in reach and all needs met. Informed of upcoming therapy schedule.    Therapy Documentation Precautions:  Precautions Precautions: Fall Precaution Comments: L foot drop, bilateral intermittent hearing loss Restrictions Weight Bearing Restrictions: No General:      Therapy/Group: Individual Therapy  Eryx Zane P Doss Cybulski 12/09/2021, 9:06 AM

## 2021-12-09 NOTE — Progress Notes (Signed)
Susan Horton Progress Note   Subjective:   Patient seen and examined at bedside.  Continues to have significant pain in L foot/leg.  Denies CP, SOB, dizziness and abdominal pain.   Objective Vitals:   12/08/21 1520 12/08/21 1957 12/09/21 0500 12/09/21 1326  BP: (!) 107/51 (!) 109/50 (!) 106/46 (!) 110/51  Pulse: 88 87 79 89  Resp: '16 16 16 16  '$ Temp: 98.1 F (36.7 C) 98.6 F (37 C) 98.5 F (36.9 C) 97.7 F (36.5 C)  TempSrc: Oral Oral Oral Oral  SpO2: 97% 96% 98% 100%  Weight:   58 kg   Height:       Physical Exam General:ill appearing female in NAD Heart:RRR, no mrg Lungs:CTAB, nml WOB on RA Abdomen:soft, NTND Extremities:no LE edema Dialysis Access: LU AVF +b/t   Filed Weights   12/07/21 2101 12/08/21 0454 12/09/21 0500  Weight: 62.4 kg 59.4 kg 58 kg    Intake/Output Summary (Last 24 hours) at 12/09/2021 1400 Last data filed at 12/09/2021 0700 Gross per 24 hour  Intake 120 ml  Output --  Net 120 ml    Additional Objective Labs: Basic Metabolic Panel: Recent Labs  Lab 12/03/21 0941 12/09/21 1253  NA 138 135  K 3.7 4.4  CL 96* 88*  CO2 24 27  GLUCOSE 103* 80  BUN 57* 43*  CREATININE 6.64* 5.05*  CALCIUM 7.6* 8.0*  PHOS 6.5* 6.7*   Liver Function Tests: Recent Labs  Lab 12/03/21 0941 12/09/21 1253  ALBUMIN 1.8* 2.0*   CBC: Recent Labs  Lab 12/07/21 0552 12/09/21 1253  WBC 17.3* 14.4*  NEUTROABS 14.7* 12.6*  HGB 7.0* 8.5*  HCT 23.7* 28.7*  MCV 79.3* 78.8*  PLT 365 450*   Blood Culture    Component Value Date/Time   SDES WOUND HIP 11/12/2021 1425   SPECREQUEST  LEFT 11/12/2021 1425   CULT  11/12/2021 1425    NO GROWTH 2 DAYS Performed at [facility], [place] [address], [place], [place] [address]    REPTSTATUS 11/14/2021 FINAL 11/12/2021 1425    Studies/Results: MR FOOT LEFT WO CONTRAST  Result Date: 12/08/2021 CLINICAL DATA:  Plantar left foot pain EXAM: MRI OF THE LEFT FOOT WITHOUT CONTRAST TECHNIQUE:  Multiplanar, multisequence MR imaging of the left forefoot was performed. No intravenous contrast was administered. COMPARISON:  X-ray 09/15/2012 FINDINGS: Bones/Joint/Cartilage No acute fracture. No dislocation. No erosion. Mild degenerative changes of the first MTP joint and IP joints of the toes. No focal site of bone marrow edema. No marrow replacement. Ligaments Intact Lisfranc ligament.  Intact collateral ligaments. Muscles and Tendons Diffuse edema-like signal throughout the foot musculature, which may reflect changes related to denervation versus myositis. No intramuscular fluid collection. Intact flexor and extensor tendons without tendinosis, tear, or tenosynovitis. Soft tissues Mild generalized soft tissue edema. No organized fluid collection. No soft tissue wound or ulceration. No intermetatarsal space abnormality. IMPRESSION: 1. No acute osseous abnormality of the left forefoot. 2. Diffuse edema-like signal throughout the foot musculature, which may reflect changes related to denervation versus myositis. 3. Mild generalized soft tissue edema. No organized fluid collection. 4. Mild degenerative changes of the left foot. Electronically Signed   By: Davina Poke D.O.   On: 12/08/2021 15:08    Medications:   aspirin  81 mg Oral Daily   atorvastatin  40 mg Oral Daily   Chlorhexidine Gluconate Cloth  6 each Topical Q0600   clopidogrel  75 mg Oral Q breakfast   darbepoetin (ARANESP)  injection - NON-DIALYSIS  200 mcg Subcutaneous Q Wed-1800   doxercalciferol  7 mcg Intravenous Q M,W,F-HD   [START ON 12/10/2021] DULoxetine  40 mg Oral Daily   heparin  5,000 Units Subcutaneous Q8H   lanthanum  1,000 mg Oral Q supper   leptospermum manuka honey  1 Application Topical Daily   melatonin  5 mg Oral QHS   metoCLOPramide  10 mg Oral TID AC   metoprolol succinate  12.5 mg Oral Daily   montelukast  10 mg Oral QHS   pantoprazole  40 mg Oral BID   pneumococcal 20-valent conjugate vaccine  0.5 mL  Intramuscular Tomorrow-1000   thiamine  100 mg Oral Daily    Dialysis Orders: MWF [facility] 3h 61mn 400/600  61kg  2/2 bath P2  Hep 4000  LUE AVF - mircera 200 mcg IV q2, last 10/16, due 10/30 - venofer '100mg'$  IV tiw thru 11/06 - doxercalciferol 7 ug tiw - sod thiosulfate 25 gm iv tiw   Assessment/Plan: Septic shock - sp course of Zosyn, now resolved -> CIR d/t debility  ESRD: Required CRRT on admit, now back to iHD MWF. On schedule next Tx today: 3h AVF, Tight heparin 2L UF  HTN/volume: BP controlled, mostly euvolemic on exam.  Anemia of ESRD: Hgb^8.5. Continue max dose Aranesp 2057m weekly while here. No IV Fe with ferritin >5000. Transfuse prn for Hgb<7 Secondary HPTH: CorrCa ok, Phos high. She has trouble tolerating any binder secondary to gastroparesis, trying Fosrenol once daily for now. Nutrition: Alb low, continue supplements sometimes refusing Nepro. Renal diet w/fluid restrictions.  Encephalopathy - D/t shock/meds? better. Had some hearing loss and foot drop 11/7 now spontaneously improving. No offending meds Myoclonus: severe, after initial consult. Felt due to meds (cefepime, gabapentin and narcotics). Resolved.  Tumoral calcinosis L hip - long-standing painful issue for her, on NaThio empirically as op, although hasn't helped. [facility]. Recultured 11/2 - NGTD. T2DM with severe gastroparesis - Ongoing issue, s/p esophageal myotomy 09/17/21. Has issues swallow/keeping pills down. L foot pain - Xray negative, uric acid normal. [facility] consulted, s/p LE angiography with improvement in pain. Venous [place]/no evidence of DVT.  Now worse again. MRI yesterday with diffuse edema through musculature - likely denervation versus myositis. No surgical implications.  Leukocytosis - persistent. Per PMD. Dispo: Tentative d/c plan of 12/12/21  LiJen MowPA-C [facility] 12/09/2021,2:00 PM  LOS: 16 days

## 2021-12-09 NOTE — Progress Notes (Addendum)
[facility]  Subjective  - increased pin in the left LE over the past 2 days.   Objective (!) 110/51 89 97.7 F (36.5 C) (Oral) 16 100%  Intake/Output Summary (Last 24 hours) at 12/09/2021 1442 Last data filed at 12/09/2021 0700 Gross per 24 hour  Intake 120 ml  Output --  Net 120 ml           Doppler signals to the ankle Peroneal and PT, no DP doppler in the dorsum of the foot   Assessment/Planning: 51 year old female with history of left external iliac artery and left SFA stenting for short distance claudication. She has now been admitted with left hip infection and has severe rest pain in the left lower extremity with concern for occlusion of her stents and is indicated for angiography with possible intervention.   She was taken to [facility] by Dr. [name] 11/16/21 now s/p  -right common iliac stent -left common iliac stent -Jetstream atherectomy left common femoral artery and SFA  - Drug-coated balloon angioplasty left common femoral artery  -Stent of left SFA with proximal   Finding demonstrated There was single-vessel runoff via the posterior tibial artery which did fill the foot.  Her PT doppler was intact on exam, her second and third toes have dry gangrene appearence with evidence of small vessel disease.    Will order B LE arterial duplex  Roxy Horseman 12/09/2021 2:42 PM --  Laboratory Lab Results: Recent Labs    12/07/21 0552 12/09/21 1253  WBC 17.3* 14.4*  HGB 7.0* 8.5*  HCT 23.7* 28.7*  PLT 365 450*   BMET Recent Labs    12/09/21 1253  NA 135  K 4.4  CL 88*  CO2 27  GLUCOSE 80  BUN 43*  CREATININE 5.05*  CALCIUM 8.0*    COAG Lab Results  Component Value Date   INR 1.3 (H) 11/09/2021   INR 1.2 11/08/2021   INR 1.1 01/09/2019   No results found for: "PTT"  I agree with the above.  I have seen and evaluated the patient.  We were called to reevaluate her toes which had become progressively  ischemic.  She is recently status post left leg intervention by Dr. [name] on 11/16/2021.  I discussed with the patient that she will likely require at minimum digital amputation.  I am going to order a duplex ultrasound to confirm patency of her most recent intervention.  We will follow-up with additional recommendations after the study.  [name]

## 2021-12-09 NOTE — Progress Notes (Signed)
BLE Arterial duplex was attempted, however patient leaning over bed actively vomiting.  Transport also waiting to take patient to scheduled HD.  Will re-attempt exam as schedule and patient condition permits.   Rogelia Rohrer, RVT/RDMS

## 2021-12-09 NOTE — Progress Notes (Addendum)
   12/09/21 1848  Vitals  Temp 98.4 F (36.9 C)  Temp Source Oral  BP (!) 107/49  MAP (mmHg) 67  BP Location Right Arm  BP Method Automatic  Patient Position (if appropriate) Lying  Pulse Rate (!) 108  Pulse Rate Source Monitor  ECG Heart Rate (!) 108  Resp 20  Oxygen Therapy  SpO2 96 %  O2 Device Room Air  Pulse Oximetry Type Continuous   Received patient in bed to unit.  Alert and oriented.  Informed consent signed and in chart.   Treatment initiated: 0932 Treatment completed: 1830  Patient tolerated well.  Transported back to the room  Alert, without acute distress.  Hand-off given to patient's nurse.   Access used: AVF Access issues: NA  Total UF removed: 300 ml Medication(s) given: Heparin 1200 units, Oxycodone '10mg'$   Post HD VS: see above Post HD weight: 58.1kg   [facility]

## 2021-12-09 NOTE — Progress Notes (Signed)
Patient returned back to unit from dialysis, via stretcher,monitored and assited  2040 Complaining of pain to BLE, especially to LLE, medicated with dilaudid per orders, unable to allow anyone to touch or assess pain due to the excruciation of pain discomfort. Rating pain 10/10., medicated per orders( see MAR), monitor

## 2021-12-09 NOTE — Progress Notes (Signed)
Occupational Therapy Session Note  Patient Details  Name: Susan Horton MRN: [id number] Date of Birth: 1970/07/04  Today's Date: 12/09/2021 OT Individual Time: 1105-1200 OT Individual Time Calculation (min): 55 min    Short Term Goals: Week 3:  OT Short Term Goal 1 (Week 3): STG = LTG due to ELOS  Skilled Therapeutic Interventions/Progress Updates:  Pt received for skilled OT session seated in WC. Session focused on UE strengthening and functional transfers. Pt agreeable to interventions, demonstrating an overall flat affect. Pt reported 10/10 pain, stating "nothing really makes it feel better" in reference to LE pain. OT offering intermediate rest breaks and positioning suggestions throughout session to address pain/fatigue and maximize participation/safety in session.   Pt demonstrating increased drowsiness and fatigue/lethargy this session. Pt performs 2-3 sets/10 reps of following exercises: -Bicep curls (3lb DB) -Punches and diagonal shoulder extension/flexion (red thera-band)  Pt requires multiple rest breaks throughout session and increased time to complete sets due to increased drowsiness.   Pt performs WC>EOB transfer using transfer board (total A for placement) with Mod A +Max VC for technique. Pt requires Min A for EOB>sup for LE management and hip positioning due to increased fatigue.   Pt supine in bed with MD present for assessment at end of session. All other immediate needs met and bed alarm activated. Pt continues to be appropriate for skilled OT intervention to promote further functional independence.    Therapy Documentation Precautions:  Precautions Precautions: Fall Precaution Comments: L foot drop, bilateral intermittent hearing loss Restrictions Weight Bearing Restrictions: No   Therapy/Group: Individual Therapy  Maudie Mercury, OTR/L, MSOT   12/09/2021, 7:34 AM

## 2021-12-09 NOTE — Progress Notes (Signed)
Patient ID: Susan Horton, female   DOB: Feb 19, 1970, 51 y.o.   MRN: [id number]  Asked to review MRI of foot as pt has been having significant pain inhibiting ambulation. Certainly no surgical indication. Condition is too diffuse for targeted injected steroid. Could consider systemic steroid if not contraindicated.    Lisette Abu, PA-C Orthopedic Surgery [phone]

## 2021-12-10 ENCOUNTER — Encounter (HOSPITAL_COMMUNITY): Payer: Medicare Other

## 2021-12-10 ENCOUNTER — Ambulatory Visit (HOSPITAL_COMMUNITY): Payer: Medicare Other | Attending: Physician Assistant

## 2021-12-10 DIAGNOSIS — I739 Peripheral vascular disease, unspecified: Secondary | ICD-10-CM | POA: Diagnosis not present

## 2021-12-10 DIAGNOSIS — I70222 Atherosclerosis of native arteries of extremities with rest pain, left leg: Secondary | ICD-10-CM | POA: Diagnosis not present

## 2021-12-10 DIAGNOSIS — I7090 Unspecified atherosclerosis: Secondary | ICD-10-CM | POA: Insufficient documentation

## 2021-12-10 MED ORDER — DARBEPOETIN ALFA 200 MCG/0.4ML IJ SOSY
200.0000 ug | PREFILLED_SYRINGE | Freq: Once | INTRAMUSCULAR | Status: AC
  Administered 2021-12-10: 200 ug via SUBCUTANEOUS
  Filled 2021-12-10: qty 0.4

## 2021-12-10 MED ORDER — PROSOURCE PLUS PO LIQD
30.0000 mL | Freq: Two times a day (BID) | ORAL | Status: DC
  Administered 2021-12-10 – 2021-12-12 (×2): 30 mL via ORAL
  Filled 2021-12-10 (×3): qty 30

## 2021-12-10 MED ORDER — CHLORHEXIDINE GLUCONATE CLOTH 2 % EX PADS: 6.0000 | MEDICATED_PAD | Freq: Every day | CUTANEOUS | Status: DC

## 2021-12-10 NOTE — Progress Notes (Signed)
This RN returned '2mg'$  dilaudid tablet to pyxsis from unused med pass of patient Susan Horton. Presciption was for '1mg'$ , pyxsis still showing need for waste of '1mg'$  from unused returned tablet. Charge nurse notified and inventory count was completed and correct. Pharmacy also notified.

## 2021-12-10 NOTE — Progress Notes (Addendum)
Occupational Therapy Note  Patient Details  Name: Susan Horton MRN: [id number] Date of Birth: 1970-08-14  Today's Date: 12/10/2021  Received secure chat from MD stating that pt's lower extremity ischemia has progressed and pt will likely require digital amputation. Pt placed on therapy hold for 11/30. Will attempt to make up missed time as pt becomes medically appropriate. 105 total minutes missed of skilled occupational therapy.      [place], MS, OTR/L  12/10/2021, 12:08 PM

## 2021-12-10 NOTE — Progress Notes (Deleted)
Patient ID: Susan Horton, female   DOB: [birth year], 51 y.o.   MRN: [id number]  Sw spoke with patient and provided tram conference updates. Patient expressed still having swelling and pain in her hip, ortho set to follow up this week. Sw will follow up with physician to verify. Patient will require a Rolator instead of rolling walker. Patient prefers continuing OP at [place]. No additional questions or concerns.

## 2021-12-10 NOTE — Progress Notes (Signed)
Lower extremity arterial bilateral study completed.   Please see CV Proc for preliminary results.   Darlin Coco, RDMS, RVT

## 2021-12-10 NOTE — Progress Notes (Signed)
PROGRESS NOTE   Subjective/Complaints: Patient currently getting vascular study. Discussed that vascular would be seeing her today, holding therapy, pain continues to be severe  ROS:  + L foot pain  +left hip pain. +neuropathy in sole and dorsum of left foot. Denies fevers, chills, abdominal pain, constipation, diarrhea, SOB, cough, chest pain, new weakness or paraesthesias.  +chronic nausea- stable  Objective:   MR FOOT LEFT WO CONTRAST  Result Date: 12/08/2021 CLINICAL DATA:  Plantar left foot pain EXAM: MRI OF THE LEFT FOOT WITHOUT CONTRAST TECHNIQUE: Multiplanar, multisequence MR imaging of the left forefoot was performed. No intravenous contrast was administered. COMPARISON:  X-ray 09/15/2012 FINDINGS: Bones/Joint/Cartilage No acute fracture. No dislocation. No erosion. Mild degenerative changes of the first MTP joint and IP joints of the toes. No focal site of bone marrow edema. No marrow replacement. Ligaments Intact Lisfranc ligament.  Intact collateral ligaments. Muscles and Tendons Diffuse edema-like signal throughout the foot musculature, which may reflect changes related to denervation versus myositis. No intramuscular fluid collection. Intact flexor and extensor tendons without tendinosis, tear, or tenosynovitis. Soft tissues Mild generalized soft tissue edema. No organized fluid collection. No soft tissue wound or ulceration. No intermetatarsal space abnormality. IMPRESSION: 1. No acute osseous abnormality of the left forefoot. 2. Diffuse edema-like signal throughout the foot musculature, which may reflect changes related to denervation versus myositis. 3. Mild generalized soft tissue edema. No organized fluid collection. 4. Mild degenerative changes of the left foot. Electronically Signed   By: Davina Poke D.O.   On: 12/08/2021 15:08   Recent Labs    12/09/21 1253  WBC 14.4*  HGB 8.5*  HCT 28.7*  PLT 450*    Recent  Labs    12/09/21 1253  NA 135  K 4.4  CL 88*  CO2 27  GLUCOSE 80  BUN 43*  CREATININE 5.05*  CALCIUM 8.0*     Intake/Output Summary (Last 24 hours) at 12/10/2021 1011 Last data filed at 12/10/2021 0900 Gross per 24 hour  Intake 0 ml  Output 0.3 ml  Net -0.3 ml     Pressure Injury 11/15/21 Sacrum Medial Stage 2 -  Partial thickness loss of dermis presenting as a shallow open injury with a red, pink wound bed without slough. (Active)  11/15/21 1030  Location: Sacrum  Location Orientation: Medial  Staging: Stage 2 -  Partial thickness loss of dermis presenting as a shallow open injury with a red, pink wound bed without slough.  Wound Description (Comments):   Present on Admission: Yes    Physical Exam: Vital Signs Blood pressure (!) 101/44, pulse 93, temperature 98.7 F (37.1 C), temperature source Oral, resp. rate 20, height '5\' 5"'$  (1.651 m), weight 55.9 kg, SpO2 96 %.  Gen: no distress, normal appearing, BMI 23.3 HEENT: oral mucosa pink and moist, NCAT, impaired hearing Cardio: tachycardic, no MRG Chest: normal effort, normal rate of breathing Abd: soft, non-distended Ext: trace LE edema b/l, left UE AVF + bruit Psych: pleasant, normal affect Musculoskeletal:     Cervical back: Normal range of motion.     Comments: Left hip tender with ROM, appears swollen. Large dressing in place. Left foot hypersensitive to touch.  Skin:    Comments:  Left hip Xeroform dressing in place.  2 incisions left posterior lateral thigh without odor but with yellowish/tan drainage with clumpy, white debris. No odor. Wounds themselves appear pink without necrotic tissue.  Left foot: tip of great toe and 2nd/third toes appear ischemic. AVF in LUE  Stage 2 to sacrum Neurological:     Mental Status: She is alert.     Comments:    Alert and oriented x 3. Follows commands, Normal insight and awareness. Intact Memory. Normal language and speech. CN 2-12 grossly intact. UE grossly 5/5. RLE 3+ to  4/5 prox to 4+/5 distally. LLE 2/5 prox to trace/5 ankle. Stocking glove sensory loss bilateral LE, L>R up to mid calf.   Unable to stand today due to pain in left foot Left 2nd and 3rd digits appear necrotic with decreased pulses Psychiatric:        Mood and Affect: Mood normal.    Flat affect      Behavior: Behavior normal.    Assessment/Plan: 1. Functional deficits which require 3+ hours per day of interdisciplinary therapy in a comprehensive inpatient rehab setting. Physiatrist is providing close team supervision and 24 hour management of active medical problems listed below. Physiatrist and rehab team continue to assess barriers to discharge/monitor patient progress toward functional and medical goals  Care Tool:  Bathing    Body parts bathed by patient: Right arm, Left arm, Chest, Abdomen, Front perineal area, Right upper leg, Left upper leg, Right lower leg, Left lower leg, Face, Buttocks   Body parts bathed by helper: Buttocks     Bathing assist Assist Level: Contact Guard/Touching assist Assistive Device Comment: stand pivot, shower chair   Upper Body Dressing/Undressing Upper body dressing   What is the patient wearing?: Pull over shirt    Upper body assist Assist Level: Independent with assistive device Assistive Device Comment: Seated on WC  Lower Body Dressing/Undressing Lower body dressing      What is the patient wearing?: Pants     Lower body assist Assist for lower body dressing: Moderate Assistance - Patient 50 - 74%     Toileting Toileting    Toileting assist Assist for toileting: Maximal Assistance - Patient 25 - 49%     Transfers Chair/bed transfer  Transfers assist     Chair/bed transfer assist level: Minimal Assistance - Patient > 75% (slideboard)     Locomotion Ambulation   Ambulation assist   Ambulation activity did not occur: Safety/medical concerns (pain, fatigue, weakness/deconditioning, decreased balance)  Assist level:  Contact Guard/Touching assist Assistive device: Walker-rolling Max distance: 10   Walk 10 feet activity   Assist  Walk 10 feet activity did not occur: Safety/medical concerns (pain, fatigue, weakness/deconditioning, decreased balance)  Assist level: Contact Guard/Touching assist Assistive device: Walker-rolling   Walk 50 feet activity   Assist Walk 50 feet with 2 turns activity did not occur: Safety/medical concerns (pain, fatigue, weakness/deconditioning, decreased balance)         Walk 150 feet activity   Assist Walk 150 feet activity did not occur: Safety/medical concerns (pain, fatigue, weakness/deconditioning, decreased balance)         Walk 10 feet on uneven surface  activity   Assist Walk 10 feet on uneven surfaces activity did not occur: Safety/medical concerns (pain, fatigue, weakness/deconditioning, decreased balance)         Wheelchair     Assist Is the patient using a wheelchair?: Yes Type of Wheelchair: Manual    Wheelchair  assist level: Supervision/Verbal cueing Max wheelchair distance: 50    Wheelchair 50 feet with 2 turns activity    Assist        Assist Level: Supervision/Verbal cueing   Wheelchair 150 feet activity     Assist      Assist Level: Supervision/Verbal cueing   Blood pressure (!) 101/44, pulse 93, temperature 98.7 F (37.1 C), temperature source Oral, resp. rate 20, height '5\' 5"'$  (1.651 m), weight 55.9 kg, SpO2 96 %.    Medical Problem List and Plan: 1. Functional deficits secondary to septic shock/metabolic with debility and encephalopathy. Pt appears cognitively to be at baseline. -  Antibiotic therapy 10/29 - 11/6             -patient may shower if left hip is covered             -ELOS/Goals: 10-12 days, mod I to supervision goals  Hold CIR, discussed possible digit amputation due to progressive ischemia  -Seen by neuropsychology  working on coping/adjustment  2.  Impaired mobility: working on sit  to stands. Provided accomodation letter for ramp and bathroom. -DVT/anticoagulation:  Pharmaceutical: continue Heparin given bleeding risk - intermittently refusing, educated patient on need 11/25. Discussed SCDs as an alternative.               -antiplatelet therapy: Aspirin 81 mg daily and Plavix 75 mg daily 3. Left foot pain, Chronic periopheral polyneuropathy (likely d/t DM 2): changed Lidoderm patch to bottom of right foot, oxycodone as needed. Provided list of foods for pain relief. Manuka honey ordered for wound healing. Avoid gabapentin since causes confusion. Increase Cymbalta to '30mg'$  daily. Discussed that MRI shows edema/myositis/denervation. Dilaudid started for severe pain  4. Insomnia: increase melatonin to '5mg'$  5. Neuropsych/cognition: This patient is capable of making decisions on her own behalf. 6. Left hip incision: Continue BID to TID dressing changes to left hip. Discussed with patient that surgeon stated continued drainage is to be expected. Apply manuka honey.  -WOC follow-up for left hip drainage. -completed course of broad spectrum abx on 11/6   -Assistance provided by orthopedics. 7. Fluids/Electrolytes/Nutrition: Routine in and outs with follow-up chemistries 8.  Peripheral vascular disease.  Follow-up vascular surgery for occluded stents status post right common iliac left common iliac stenting with balloon angioplasty left common femoral and stent left SFA 11/16/2021 per Dr. [name] 9.  Tumoral calcinosis.  Recent excision of tumoral calcinosis left hip thigh at [facility] 10/20/2021.  Weightbearing as tolerated. Communicated MRI results to ortho 10.  End-stage renal disease.  Continue hemodialysis as directed. Discussed improved Creatinine to 3.08 on 11/14.  11.  Acute on chronic anemia.  Continue iron supplement 12.  Leukocytosis.  (20k)  -Broad spectrum antibiotic therapy completed 11/16/2021 and monitor.  Latest blood culture showed no growth             -appearance  of drainage idiscussed with ortho and is to be expected. Recent CT (10/30) demonstrating changes c/w tumoral calcinosis and mild cellulitis.              -MRI hip obtianed and stable              -ischemic toes LLE could also be a source             -continue wound care as above             -pt feels well otherwise and no other signs of infection             -  consider H/O consult if persistent  -WBC yesterday was 21.8, recheck ordered for Monday  13.  Asthma without exacerbation as well as remote history of tobacco abuse.  Continue inhalers as directed check oxygen saturations every shift 14. Diet controlled Diabetes mellitus. Blood sugar checks discontinued 15.  Hypertension.  Toprol-XL 25 mg daily.  Monitor with increased mobility -11/24 well controlled, monitor    12/10/2021    5:47 AM 12/10/2021    5:00 AM 12/09/2021    8:14 PM  Vitals with BMI  Weight  123 lbs 4 oz   BMI  16.10   Systolic 960  454  Diastolic 44  57  Pulse 93  99    16.  Abrupt onset bilateral sensorineural hearing loss.  Confirmed by audiology 11/7 the symptoms have resolved.  Follow-up outpatient. 17.  Diastolic and systolic congestive heart failure.  Monitor for any signs of fluid overload. Daily weights ordered Filed Weights   12/09/21 0500 12/09/21 1848 12/10/21 0500  Weight: 58 kg 58.1 kg 55.9 kg  Weight overall stable/decreased  18. Pruritus: sarna lotion ordered.  19. Diarrhea: continue imodium  -11/23 Reports BM 2 days ago, order miralax prN in case constipation develops  -11/24 continue miralax prn-she would like to take medication after dialysis completed. She says she will ask for it later. Will add lactulose PRN for more severe constipation            - 11/25: Last recorded BM 11/17; took PRN miralax last night without results; per patient last BM 11/20. Ordered to give PRN lactulose tonight. +passing flatus  20. Insomnia: secondary to diarrhea, continue imodium.  21. Dysuria: UA/UC  ordered -11/25 will stop diflucan   22. Chronic nausea  -Continue Zofran PRN  -Suspect GERD may be contributing, increase PPI to BID  -11/23 a little improved Monitor response to increased PPI frequency 23. GERD  -increase pantoprazole to BID 24. Necrotic left 2nd and 3rd digits of left foot with decreased pulses: consulted vascular to ensure stents are patent. Dilaudid started for severe pain 25. Tachycardia: likely secondary to pain, continue management as above   LOS: 17 days A FACE TO FACE EVALUATION WAS PERFORMED  Susan Horton P Susan Horton 12/10/2021, 10:11 AM

## 2021-12-10 NOTE — Progress Notes (Signed)
Initial Nutrition Assessment  DOCUMENTATION CODES:  Not applicable  INTERVENTION:  Liberalize diet to renal only Provide prosource Plus BID (100kcal, 15g protein/1oz serving) Provide PM snack: 1/2 pb&j sandwich Consider small, divided doses of Fosrenol for better acceptance/tolerance Encourage po intake  NUTRITION DIAGNOSIS:  Increased nutrient needs related to chronic illness, wound healing as evidenced by estimated needs.  GOAL:  Patient will meet greater than or equal to 90% of their needs  MONITOR:   PO intake, Supplement acceptance  REASON FOR ASSESSMENT:  Consult Wound healing  ASSESSMENT:  Pt is a 51yo F with PMH of asthma, GERD, DVT, gastroparesis, HTN, T2DM, anemia, CAD, PVD, ESRD MWF HD, and gluteal abscess who presents with sepsis.  Pt with inadequate po intake throughout admission. She has increased nutrient needs due to wound healing and HD. Pt on very restrictive diet which limits menu options. Pt reports she follows a renal diet at home, but has never watched CHO because, "I'm not diabetic." BG maintaining WNL, no DM medication during admission, A1c:5.0. Recommend liberalizing diet to renal only. Phos remains elevated during admission. Pt reports she does not take phosphate binder at home. Fosrenol ordered q day due c/o slowing gastric emptying. However, documentation shows pt refusing binder. Phos continues to trend up, now 6.7. Consider smaller dose of Fosrenol TID. Discussed ONS options due to concern for protein intake. Pt reports she does not like Ensure, Boost or Nepro. Discussed trial of Prosource because it is just 1oz of fluid and will provide 15g protein. Pt willing to try it. Discussed snack options. Pt reports she likes PB&J sandwich, ordered every other day as pm snack.  Recorded intake: 11/26: 100% D 11/27: 40% B, 25%L, 75%D 11/28:100%L, 50%D 11/29:0%B 11/30:0%B  Medications reviewed and include: aspirin, lipitor, plavix, fosrenol daily, reglan,  protonix, thiamine  Labs reviewed: BG:80-103(no medications), BUN:43. Cr:5.05, Phos:6.7, GFR:10, A1c:5.0   NUTRITION - FOCUSED PHYSICAL EXAM: Deferred per pt  Diet Order:   Diet Order             Diet renal with fluid restriction Fluid restriction: 1200 mL Fluid; Room service appropriate? Yes; Fluid consistency: Thin  Diet effective now                   EDUCATION NEEDS:  Education needs have been addressed  Skin:  Skin Assessment: Skin Integrity Issues: Skin Integrity Issues:: Stage II, Incisions, Other (Comment) Stage II: sacrum Incisions: thigh Other: abrasion buttock  Last BM:  11/27  Height:  Ht Readings from Last 1 Encounters:  11/23/21 '5\' 5"'$  (1.651 m)   Weight:  Wt Readings from Last 1 Encounters:  12/10/21 55.9 kg    BMI:  Body mass index is 20.51 kg/m.  Estimated Nutritional Needs:  Kcal:  1400-1700kcal Protein:  70-85g Fluid:  1014m + urine output  KCandise Bowens MS, RD, LDN, CNSC See AMiON for contact information

## 2021-12-10 NOTE — Progress Notes (Signed)
Patient ID: Susan Horton, female   DOB: 1970-08-17, 51 y.o.   MRN: [id number]  Family education scheduled with patient sister on 12/8 10-12

## 2021-12-10 NOTE — Progress Notes (Signed)
Advised by CSW that pt will d/c from rehab on 12/9. Update provided to [place] and renal PA.   Melven Sartorius Renal Navigator [phone]

## 2021-12-10 NOTE — Progress Notes (Signed)
Physical Therapy Weekly Progress Note  Patient Details  Name: Susan Horton MRN: [id number] Date of Birth: 08/30/1970  Beginning of progress report period: November 24, 2021 End of progress report period: December 10, 2021  Patient has met 1 of 4 short term goals. Pt has recently experienced medical set back due to recent increase in bilateral neuropathic foot pain, As of 11/30 pt's lower extremity ischemia has progressed and pt will likely require digital amputation. Pt currently requires mod A for bed mobility, mod A for slideboard transfers, and has been unable to tolerate weight bearing through LEs to stand or ambulate. Last week, pt was able to perform bed mobility with supervision transfers with RW and min A, and ambulate up to 47f with RW and close supervision. Pt's discharge date as been extended until 12/9 with family education postponed until 12/8.  Patient continues to demonstrate the following deficits muscle weakness, decreased cardiorespiratoy endurance, and decreased standing balance, decreased postural control, and decreased balance strategies and therefore will continue to benefit from skilled PT intervention to increase functional independence with mobility.  Patient progressing toward long term goals..  Continue plan of care.  PT Short Term Goals Week 2:  PT Short Term Goal 1 (Week 2): pt will transfer sit<>stand with LRAD and consistant SUP PT Short Term Goal 1 - Progress (Week 2): Progressing toward goal PT Short Term Goal 2 (Week 2): pt will transfer bed<>chair with LRAD and consistant SUP PT Short Term Goal 2 - Progress (Week 2): Progressing toward goal PT Short Term Goal 3 (Week 2): pt will ambulate [id number] LRAD and SUP PT Short Term Goal 3 - Progress (Week 2): Progressing toward goal PT Short Term Goal 4 (Week 2): Pt will initiate stair training. PT Short Term Goal 4 - Progress (Week 2): Met Week 3:  PT Short Term Goal 1 (Week 3): STG=LTG due to LOS  Skilled  Therapeutic Interventions/Progress Updates:  Ambulation/gait training;Discharge planning;Functional mobility training;Psychosocial support;Therapeutic Activities;Visual/perceptual remediation/compensation;Balance/vestibular training;Disease management/prevention;Neuromuscular re-education;Skin care/wound management;Therapeutic Exercise;Wheelchair propulsion/positioning;DME/adaptive equipment instruction;Cognitive remediation/compensation;Pain management;Splinting/orthotics;UE/LE Strength taining/ROM;Community reintegration;Functional electrical stimulation;Patient/family education;Stair training;UE/LE Coordination activities   Therapy Documentation Precautions:  Precautions Precautions: Fall Precaution Comments: L foot drop, bilateral intermittent hearing loss Restrictions Weight Bearing Restrictions: No  Therapy/Group: Individual Therapy AnAlfonse AlpersT, DPT  12/10/2021, 6:53 AM

## 2021-12-10 NOTE — Progress Notes (Signed)
Physical Therapy Note  Patient Details  Name: Susan Horton MRN: [id number] Date of Birth: 07/09/1970 Today's Date: 12/10/2021   Received secure chat from MD stating that pt's lower extremity ischemia has progressed and pt will likely require digital amputation. Pt placed on therapy hold for 11/30. Will attempt to make up missed time as pt becomes medically appropriate. 105 minutes missed of skilled physical therapy.   [place], DPT  12/10/2021, 7:22 AM

## 2021-12-10 NOTE — Progress Notes (Signed)
[facility] Progress Note   Subjective:   Patient seen and examined in room.  Discharge from CIR moved to 12/9. Continues to have significant pain in LLE.  No other specific complaints.   Objective Vitals:   12/09/21 2014 12/10/21 0500 12/10/21 0547 12/10/21 1328  BP: (!) 104/57  (!) 101/44 (!) 118/49  Pulse: 99  93 87  Resp: '18  20 16  '$ Temp: 99.4 F (37.4 C)  98.7 F (37.1 C) 98.8 F (37.1 C)  TempSrc: Oral  Oral Oral  SpO2: 98%  96% 97%  Weight:  55.9 kg    Height:       Physical Exam General:ill appearing female in NAD Heart:RRR Lungs:CTAB Abdomen:soft, NTND Extremities:no LE edema Dialysis Access: LU AVF +b/t  Filed Weights   12/09/21 0500 12/09/21 1848 12/10/21 0500  Weight: 58 kg 58.1 kg 55.9 kg    Intake/Output Summary (Last 24 hours) at 12/10/2021 1453 Last data filed at 12/10/2021 0900 Gross per 24 hour  Intake 0 ml  Output 0.3 ml  Net -0.3 ml    Additional Objective Labs: Basic Metabolic Panel: Recent Labs  Lab 12/09/21 1253  NA 135  K 4.4  CL 88*  CO2 27  GLUCOSE 80  BUN 43*  CREATININE 5.05*  CALCIUM 8.0*  PHOS 6.7*   Liver Function Tests: Recent Labs  Lab 12/09/21 1253  ALBUMIN 2.0*   CBC: Recent Labs  Lab 12/07/21 0552 12/09/21 1253  WBC 17.3* 14.4*  NEUTROABS 14.7* 12.6*  HGB 7.0* 8.5*  HCT 23.7* 28.7*  MCV 79.3* 78.8*  PLT 365 450*    Studies/Results: VAS [name] LOWER EXTREMITY ARTERIAL DUPLEX  Result Date: 12/10/2021 LOWER EXTREMITY ARTERIAL DUPLEX STUDY Patient Name:  DORISE GANGI  Date of Exam:   12/10/2021 Medical Rec #: [id number]      Accession #:    [id number] Date of Birth: March 02, 1970       Patient Gender: F Patient Age:   51 years Exam Location:  [facility] Procedure:      VAS [name] LOWER EXTREMITY ARTERIAL DUPLEX Referring Phys: EMMA COLLINS --------------------------------------------------------------------------------  Indications: Rest pain, peripheral artery disease, and severe bilateral  lower              extremity pain with ischemic changes post-op. High Risk Factors: Hypertension, Diabetes, coronary artery disease.  Current ABI: N/A Limitations: Patient pain, unable to change position, tissue changes Comparison Study: 11-16-2021 Prior abdominal aortogram with intervention- stent                   of RT CIA, stent of LT CIA, atherectomy of LT CFA and SFA,                   stent of LT SFA.                    Patient history of LT EIA and LT SFA stent prior to aortogram. Performing Technologist: Darlin Coco RDMS, RVT  Examination Guidelines: A complete evaluation includes B-mode imaging, spectral Doppler, color Doppler, and power Doppler as needed of all accessible portions of each vessel. Bilateral testing is considered an integral part of a complete examination. Limited examinations for reoccurring indications may be performed as noted.  +-----------+--------+-----+---------------+-----------------+-----------------+ RIGHT      PSV cm/sRatioStenosis       Waveform         Comments          +-----------+--------+-----+---------------+-----------------+-----------------+ CIA Mid  116                         monophasic       Turbulent         +-----------+--------+-----+---------------+-----------------+-----------------+ EIA Mid    220          50-74% stenosismonophasic       Turbulent         +-----------+--------+-----+---------------+-----------------+-----------------+ CFA Prox   159                         monophasic       Turbulent         +-----------+--------+-----+---------------+-----------------+-----------------+ CFA Mid    292          50-74% stenosismonophasic       Calcific plaque   +-----------+--------+-----+---------------+-----------------+-----------------+ CFA Distal 209                         monophasic       Turbulent         +-----------+--------+-----+---------------+-----------------+-----------------+ DFA        102                          monophasic                         +-----------+--------+-----+---------------+-----------------+-----------------+ SFA Prox   16                          dampened                                                                  monophasic                         +-----------+--------+-----+---------------+-----------------+-----------------+ SFA Mid    69      4:1  50-74% stenosismonophasic       Focal elevation                                                           at the prox-mid                                                           segment           +-----------+--------+-----+---------------+-----------------+-----------------+ SFA Distal 41                          monophasic       Collateral        +-----------+--------+-----+---------------+-----------------+-----------------+ POP Prox   23                                                             +-----------+--------+-----+---------------+-----------------+-----------------+  POP Mid    23                                                             +-----------+--------+-----+---------------+-----------------+-----------------+ POP Distal 30                                                             +-----------+--------+-----+---------------+-----------------+-----------------+ TP Trunk   16                          dampened         Limited views                                            monophasic       demonstrate poor                                                          perfusion of the                                                          TPT               +-----------+--------+-----+---------------+-----------------+-----------------+ PTA Prox   25                          dampened                                                                  monophasic                          +-----------+--------+-----+---------------+-----------------+-----------------+ PTA Mid    16                          dampened                                                                  monophasic                         +-----------+--------+-----+---------------+-----------------+-----------------+  PTA Distal 9                           dampened                                                                  monophasic                         +-----------+--------+-----+---------------+-----------------+-----------------+ PERO Prox               occluded       absent                             +-----------+--------+-----+---------------+-----------------+-----------------+ PERO Mid                occluded                                          +-----------+--------+-----+---------------+-----------------+-----------------+ PERO Distal             occluded                                          +-----------+--------+-----+---------------+-----------------+-----------------+ DP                      occluded       absent                             +-----------+--------+-----+---------------+-----------------+-----------------+  +----------+--------+-----+---------------+------------------+-----------------+ LEFT      PSV cm/sRatioStenosis       Waveform          Comments          +----------+--------+-----+---------------+------------------+-----------------+ EIA Mid                                                 Not well                                                                  visualized                                                                secondary to  bowel and patient                                                         position          +----------+--------+-----+---------------+------------------+-----------------+ CFA  Prox  241                         monophasic        Turbulent         +----------+--------+-----+---------------+------------------+-----------------+ CFA Mid   211                         monophasic                          +----------+--------+-----+---------------+------------------+-----------------+ CFA Distal282          50-74% stenosismonophasic        Turbulent         +----------+--------+-----+---------------+------------------+-----------------+ DFA       208                         monophasic                          +----------+--------+-----+---------------+------------------+-----------------+ SFA Prox  89                          monophasic        Turbulent         +----------+--------+-----+---------------+------------------+-----------------+ SFA Mid   46                          monophasic                          +----------+--------+-----+---------------+------------------+-----------------+ SFA Distal100          50-74% stenosismonophasic        Focal elevation                                                           at mid-distal                                                             with distal                                                               velocity of 45  cm/s              +----------+--------+-----+---------------+------------------+-----------------+ POP Prox  91                          monophasic                          +----------+--------+-----+---------------+------------------+-----------------+ POP Mid   76                          monophasic                          +----------+--------+-----+---------------+------------------+-----------------+ POP Distal71                          monophasic                          +----------+--------+-----+---------------+------------------+-----------------+ TP Trunk                                                 Unable to                                                                 insonate                                                                  secondary to                                                              patient position  +----------+--------+-----+---------------+------------------+-----------------+ PTA Prox  16                          dampened                                                                  monophasic                          +----------+--------+-----+---------------+------------------+-----------------+ PTA Mid   45                          monophasic                          +----------+--------+-----+---------------+------------------+-----------------+  PTA Distal44                          dampened          Limited views                                           monophasic        secondary to                                                              patient position                                                          demonstrated                                                              limited vessel                                                            perfusion         +----------+--------+-----+---------------+------------------+-----------------+ PERO Prox              occluded       absent                              +----------+--------+-----+---------------+------------------+-----------------+ DP                     occluded       absent                              +----------+--------+-----+---------------+------------------+-----------------+  Summary: Right: 50-74% stenosis noted in the external iliac segment. 50-74% stenosis noted in the common femoral artery. 50-74% stenosis noted in the superficial femoral artery. Total occlusion noted in the peroneal artery. Total occlusion noted in the  dorsal pedis artery. Turbulent common iliac flow suggestive of more proximal obstruction. Left: 50-74% stenosis noted in the common femoral artery. 50-74% stenosis noted in the superficial femoral artery. Total occlusion noted in the peroneal artery. Total occlusion noted in the dorsal pedis artery. Views of the iliac segments were limited secondary to patient position and bowel. Turbulent common femoral flow suggestive of more proximal obstruction.   See table(s) above for measurements and observations.    Preliminary     Medications:   (feeding supplement) PROSource Plus  30 mL Oral BID BM   aspirin  81 mg Oral Daily  atorvastatin  40 mg Oral Daily   Chlorhexidine Gluconate Cloth  6 each Topical Q0600   clopidogrel  75 mg Oral Q breakfast   darbepoetin (ARANESP) injection - NON-DIALYSIS  200 mcg Subcutaneous Q Wed-1800   doxercalciferol  7 mcg Intravenous Q M,W,F-HD   DULoxetine  40 mg Oral Daily   heparin  5,000 Units Subcutaneous Q8H   lanthanum  1,000 mg Oral Q supper   leptospermum manuka honey  1 Application Topical Daily   melatonin  5 mg Oral QHS   metoCLOPramide  10 mg Oral TID AC   metoprolol succinate  12.5 mg Oral Daily   montelukast  10 mg Oral QHS   pantoprazole  40 mg Oral BID   pneumococcal 20-valent conjugate vaccine  0.5 mL Intramuscular Tomorrow-1000   thiamine  100 mg Oral Daily    Dialysis Orders: [facility] 3h 56mn 400/600  61kg  2/2 bath P2  Hep 4000  LUE AVF - mircera 200 mcg IV q2, last 10/16, due 10/30 - venofer '100mg'$  IV tiw thru 11/06 - doxercalciferol 7 ug tiw - sod thiosulfate 25 gm iv tiw   Assessment/Plan: Septic shock - sp course of Zosyn, now resolved -> CIR d/t debility  ESRD: Required CRRT on admit, now back to [facility] MWF. On schedule next Tx 12/1: 3h AVF, Tight heparin 2L UF  HTN/volume: BP controlled, mostly euvolemic on exam.  Anemia of ESRD: Hgb^8.5. Continue max dose Aranesp 2061m weekly while here. No IV Fe with ferritin >5000. Transfuse  prn for Hgb<7 Secondary HPTH: CorrCa ok, Phos high. She has trouble tolerating any binder secondary to gastroparesis, trying Fosrenol once daily for now. Nutrition: Alb low, continue supplements sometimes refusing Nepro. Renal diet w/fluid restrictions.  Encephalopathy - D/t shock/meds? better. Had some hearing loss and foot drop 11/7 now spontaneously improving. No offending meds Myoclonus: severe, after initial consult. Felt due to meds (cefepime, gabapentin and narcotics). Resolved.  Tumoral calcinosis L hip - long-standing painful issue for her, on NaThio empirically as op, although hasn't helped. [facility]. Recultured 11/2 - NGTD. F/u with ortho this week.  T2DM with severe gastroparesis - Ongoing issue, s/p esophageal myotomy 09/17/21. Has issues swallow/keeping pills down. L foot pain - Xray negative, uric acid normal. [facility] consulted, s/p LE angiography with improvement in pain. Venous [place]/no evidence of DVT.  Now worse again. MRI yesterday with diffuse edema through musculature - likely denervation versus myositis. [facility]VS consulting, gangrenous changes of toes, likely needs at minimal digital amputation.  Leukocytosis - persistent. Per PMD. Dispo: Tentative d/c plan of 12/19/21  LiJen MowPA-C [facility] 12/10/2021,2:53 PM  LOS: 17 days

## 2021-12-11 DIAGNOSIS — L89152 Pressure ulcer of sacral region, stage 2: Secondary | ICD-10-CM | POA: Insufficient documentation

## 2021-12-11 DIAGNOSIS — M79605 Pain in left leg: Secondary | ICD-10-CM

## 2021-12-11 DIAGNOSIS — M79604 Pain in right leg: Secondary | ICD-10-CM

## 2021-12-11 MED ORDER — SORBITOL 70 % SOLN
30.0000 mL | Freq: Once | Status: AC
  Administered 2021-12-11: 30 mL via ORAL
  Filled 2021-12-11 (×2): qty 30

## 2021-12-11 NOTE — Progress Notes (Signed)
PROGRESS NOTE   Subjective/Complaints:   ROS:  + L foot pain  +left hip pain. +neuropathy in sole and dorsum of left foot. Denies fevers, chills, SOB, cough, chest pain, new weakness or paraesthesias.  +chronic nausea- stable, +constipation  Objective:   VAS [name] LOWER EXTREMITY ARTERIAL DUPLEX  Result Date: 12/10/2021 LOWER EXTREMITY ARTERIAL DUPLEX STUDY Patient Name:  Susan Horton  Date of Exam:   12/10/2021 Medical Rec #: [id number]      Accession #:    [id number] Date of Birth: December 17, 1970       Patient Gender: F Patient Age:   51 years Exam Location:  [facility] Procedure:      VAS [name] LOWER EXTREMITY ARTERIAL DUPLEX Referring Phys: EMMA COLLINS --------------------------------------------------------------------------------  Indications: Rest pain, peripheral artery disease, and severe bilateral lower              extremity pain with ischemic changes post-op. High Risk Factors: Hypertension, Diabetes, coronary artery disease.  Current ABI: N/A Limitations: Patient pain, unable to change position, tissue changes Comparison Study: 11-16-2021 Prior abdominal aortogram with intervention- stent                   of RT CIA, stent of LT CIA, atherectomy of LT CFA and SFA,                   stent of LT SFA.                    Patient history of LT EIA and LT SFA stent prior to aortogram. Performing Technologist: Darlin Coco RDMS, RVT  Examination Guidelines: A complete evaluation includes B-mode imaging, spectral Doppler, color Doppler, and power Doppler as needed of all accessible portions of each vessel. Bilateral testing is considered an integral part of a complete examination. Limited examinations for reoccurring indications may be performed as noted.  +-----------+--------+-----+---------------+-----------------+-----------------+ RIGHT      PSV cm/sRatioStenosis       Waveform         Comments           +-----------+--------+-----+---------------+-----------------+-----------------+ CIA Mid    116                         monophasic       Turbulent         +-----------+--------+-----+---------------+-----------------+-----------------+ EIA Mid    220          50-74% stenosismonophasic       Turbulent         +-----------+--------+-----+---------------+-----------------+-----------------+ CFA Prox   159                         monophasic       Turbulent         +-----------+--------+-----+---------------+-----------------+-----------------+ CFA Mid    292          50-74% stenosismonophasic       Calcific plaque   +-----------+--------+-----+---------------+-----------------+-----------------+ CFA Distal 209                         monophasic  Turbulent         +-----------+--------+-----+---------------+-----------------+-----------------+ DFA        102                         monophasic                         +-----------+--------+-----+---------------+-----------------+-----------------+ SFA Prox   16                          dampened                                                                  monophasic                         +-----------+--------+-----+---------------+-----------------+-----------------+ SFA Mid    69      4:1  50-74% stenosismonophasic       Focal elevation                                                           at the prox-mid                                                           segment           +-----------+--------+-----+---------------+-----------------+-----------------+ SFA Distal 41                          monophasic       Collateral        +-----------+--------+-----+---------------+-----------------+-----------------+ POP Prox   23                                                             +-----------+--------+-----+---------------+-----------------+-----------------+ POP  Mid    23                                                             +-----------+--------+-----+---------------+-----------------+-----------------+ POP Distal 30                                                             +-----------+--------+-----+---------------+-----------------+-----------------+ TP Trunk   16  dampened         Limited views                                            monophasic       demonstrate poor                                                          perfusion of the                                                          TPT               +-----------+--------+-----+---------------+-----------------+-----------------+ PTA Prox   25                          dampened                                                                  monophasic                         +-----------+--------+-----+---------------+-----------------+-----------------+ PTA Mid    16                          dampened                                                                  monophasic                         +-----------+--------+-----+---------------+-----------------+-----------------+ PTA Distal 9                           dampened                                                                  monophasic                         +-----------+--------+-----+---------------+-----------------+-----------------+ PERO Prox               occluded       absent                             +-----------+--------+-----+---------------+-----------------+-----------------+  PERO Mid                occluded                                          +-----------+--------+-----+---------------+-----------------+-----------------+ PERO Distal             occluded                                          +-----------+--------+-----+---------------+-----------------+-----------------+ DP                       occluded       absent                             +-----------+--------+-----+---------------+-----------------+-----------------+  +----------+--------+-----+---------------+------------------+-----------------+ LEFT      PSV cm/sRatioStenosis       Waveform          Comments          +----------+--------+-----+---------------+------------------+-----------------+ EIA Mid                                                 Not well                                                                  visualized                                                                secondary to                                                              bowel and patient                                                         position          +----------+--------+-----+---------------+------------------+-----------------+ CFA Prox  241                         monophasic        Turbulent         +----------+--------+-----+---------------+------------------+-----------------+ CFA Mid   211  monophasic                          +----------+--------+-----+---------------+------------------+-----------------+ CFA Distal282          50-74% stenosismonophasic        Turbulent         +----------+--------+-----+---------------+------------------+-----------------+ DFA       208                         monophasic                          +----------+--------+-----+---------------+------------------+-----------------+ SFA Prox  89                          monophasic        Turbulent         +----------+--------+-----+---------------+------------------+-----------------+ SFA Mid   46                          monophasic                          +----------+--------+-----+---------------+------------------+-----------------+ SFA Distal100          50-74% stenosismonophasic        Focal elevation                                                            at mid-distal                                                             with distal                                                               velocity of 45                                                            cm/s              +----------+--------+-----+---------------+------------------+-----------------+ POP Prox  91                          monophasic                          +----------+--------+-----+---------------+------------------+-----------------+ POP Mid   76                          monophasic                          +----------+--------+-----+---------------+------------------+-----------------+  POP Distal71                          monophasic                          +----------+--------+-----+---------------+------------------+-----------------+ TP Trunk                                                Unable to                                                                 insonate                                                                  secondary to                                                              patient position  +----------+--------+-----+---------------+------------------+-----------------+ PTA Prox  16                          dampened                                                                  monophasic                          +----------+--------+-----+---------------+------------------+-----------------+ PTA Mid   45                          monophasic                          +----------+--------+-----+---------------+------------------+-----------------+ PTA Distal44                          dampened          Limited views                                           monophasic        secondary to  patient position                                                           demonstrated                                                              limited vessel                                                            perfusion         +----------+--------+-----+---------------+------------------+-----------------+ PERO Prox              occluded       absent                              +----------+--------+-----+---------------+------------------+-----------------+ DP                     occluded       absent                              +----------+--------+-----+---------------+------------------+-----------------+  Summary: Right: 50-74% stenosis noted in the external iliac segment. 50-74% stenosis noted in the common femoral artery. 50-74% stenosis noted in the superficial femoral artery. Total occlusion noted in the peroneal artery. Total occlusion noted in the dorsal pedis artery. Turbulent common iliac flow suggestive of more proximal obstruction. Left: 50-74% stenosis noted in the common femoral artery. 50-74% stenosis noted in the superficial femoral artery. Total occlusion noted in the peroneal artery. Total occlusion noted in the dorsal pedis artery. Views of the iliac segments were limited secondary to patient position and bowel. Turbulent common femoral flow suggestive of more proximal obstruction.   See table(s) above for measurements and observations. Electronically signed by Jamelle Haring on 12/10/2021 at 5:27:56 PM.    Final    Recent Labs    12/09/21 1253  WBC 14.4*  HGB 8.5*  HCT 28.7*  PLT 450*    Recent Labs    12/09/21 1253  NA 135  K 4.4  CL 88*  CO2 27  GLUCOSE 80  BUN 43*  CREATININE 5.05*  CALCIUM 8.0*     Intake/Output Summary (Last 24 hours) at 12/11/2021 1109 Last data filed at 12/11/2021 0700 Gross per 24 hour  Intake 0 ml  Output --  Net 0 ml         Physical Exam: Vital Signs Blood pressure (!) 105/42, pulse 84, temperature 98.5 F (36.9 C),  temperature source Oral, resp. rate 18, height '5\' 5"'$  (1.651 m), weight 60 kg, SpO2 97 %.  Gen: no distress, normal appearing, BMI 23.3 HEENT: oral mucosa pink and moist, NCAT, impaired hearing Cardio: tachycardic, no MRG Chest: normal effort, normal rate of breathing Abd: soft, distended Ext: trace LE edema b/l, left  UE AVF + bruit Psych: pleasant, normal affect Musculoskeletal:     Cervical back: Normal range of motion.     Comments: Left hip tender with ROM, appears swollen. Large dressing in place. Left foot hypersensitive to touch.  Skin:    Comments:  Left hip Xeroform dressing in place.  2 incisions left posterior lateral thigh without odor but with yellowish/tan drainage with clumpy, white debris. No odor. Wounds themselves appear pink without necrotic tissue.  Left foot: tip of great toe and 2nd/third toes appear ischemic. AVF in LUE  Stage 2 to sacrum Neurological:     Mental Status: She is alert.     Comments:    Alert and oriented x 3. Follows commands, Normal insight and awareness. Intact Memory. Normal language and speech. CN 2-12 grossly intact. UE grossly 5/5. RLE 3+ to 4/5 prox to 4+/5 distally. LLE 2/5 prox to trace/5 ankle. Stocking glove sensory loss bilateral LE, L>R up to mid calf.   Unable to stand today due to pain in left foot Left 2nd and 3rd digits appear necrotic with decreased pulses Psychiatric:        Mood and Affect: Mood normal.    Flat affect      Behavior: Behavior normal.    Assessment/Plan: 1. Functional deficits which require 3+ hours per day of interdisciplinary therapy in a comprehensive inpatient rehab setting. Physiatrist is providing close team supervision and 24 hour management of active medical problems listed below. Physiatrist and rehab team continue to assess barriers to discharge/monitor patient progress toward functional and medical goals  Care Tool:  Bathing    Body parts bathed by patient: Right arm, Left arm, Chest, Abdomen,  Front perineal area, Right upper leg, Left upper leg, Right lower leg, Left lower leg, Face, Buttocks   Body parts bathed by helper: Buttocks     Bathing assist Assist Level: Contact Guard/Touching assist Assistive Device Comment: stand pivot, shower chair   Upper Body Dressing/Undressing Upper body dressing   What is the patient wearing?: Pull over shirt    Upper body assist Assist Level: Independent with assistive device Assistive Device Comment: Seated on WC  Lower Body Dressing/Undressing Lower body dressing      What is the patient wearing?: Pants     Lower body assist Assist for lower body dressing: Moderate Assistance - Patient 50 - 74%     Toileting Toileting Toileting Activity did not occur Landscape architect and hygiene only): N/A (no void or bm)  Toileting assist Assist for toileting: Maximal Assistance - Patient 25 - 49%     Transfers Chair/bed transfer  Transfers assist     Chair/bed transfer assist level: Minimal Assistance - Patient > 75% (slideboard)     Locomotion Ambulation   Ambulation assist   Ambulation activity did not occur: Safety/medical concerns (pain, fatigue, weakness/deconditioning, decreased balance)  Assist level: Contact Guard/Touching assist Assistive device: Walker-rolling Max distance: 10   Walk 10 feet activity   Assist  Walk 10 feet activity did not occur: Safety/medical concerns (pain, fatigue, weakness/deconditioning, decreased balance)  Assist level: Contact Guard/Touching assist Assistive device: Walker-rolling   Walk 50 feet activity   Assist Walk 50 feet with 2 turns activity did not occur: Safety/medical concerns (pain, fatigue, weakness/deconditioning, decreased balance)         Walk 150 feet activity   Assist Walk 150 feet activity did not occur: Safety/medical concerns (pain, fatigue, weakness/deconditioning, decreased balance)         Walk 10 feet on  uneven surface  activity   Assist  Walk 10 feet on uneven surfaces activity did not occur: Safety/medical concerns (pain, fatigue, weakness/deconditioning, decreased balance)         Wheelchair     Assist Is the patient using a wheelchair?: Yes Type of Wheelchair: Manual    Wheelchair assist level: Supervision/Verbal cueing Max wheelchair distance: 50    Wheelchair 50 feet with 2 turns activity    Assist        Assist Level: Supervision/Verbal cueing   Wheelchair 150 feet activity     Assist      Assist Level: Supervision/Verbal cueing   Blood pressure (!) 105/42, pulse 84, temperature 98.5 F (36.9 C), temperature source Oral, resp. rate 18, height '5\' 5"'$  (1.651 m), weight 60 kg, SpO2 97 %.    Medical Problem List and Plan: 1. Functional deficits secondary to septic shock/metabolic with debility and encephalopathy. Pt appears cognitively to be at baseline. -  Antibiotic therapy 10/29 - 11/6             -patient may shower if left hip is covered             -ELOS/Goals: 10-12 days, mod I to supervision goals  Continue CIR  -Seen by neuropsychology  working on coping/adjustment  2.  Impaired mobility: working on sit to stands. Provided accomodation letter for ramp and bathroom. -DVT/anticoagulation:  Pharmaceutical: continue Heparin given bleeding risk - intermittently refusing, educated patient on need 11/25. Discussed SCDs as an alternative.               -antiplatelet therapy: Aspirin 81 mg daily and Plavix 75 mg daily 3. Left foot pain, Chronic periopheral polyneuropathy (likely d/t DM 2): changed Lidoderm patch to bottom of right foot, oxycodone as needed. Provided list of foods for pain relief. Manuka honey ordered for wound healing. Avoid gabapentin since causes confusion. Increase Cymbalta to '30mg'$  daily. Discussed that MRI shows edema/myositis/denervation. Dilaudid started for severe pain  4. Insomnia: increase melatonin to '5mg'$  5. Neuropsych/cognition: This patient is capable of making  decisions on her own behalf. 6. Left hip incision: Continue BID to TID dressing changes to left hip. Discussed with patient that surgeon stated continued drainage is to be expected. Apply manuka honey.  -WOC follow-up for left hip drainage. -completed course of broad spectrum abx on 11/6   -Assistance provided by orthopedics. 7. Fluids/Electrolytes/Nutrition: Routine in and outs with follow-up chemistries 8.  Peripheral vascular disease.  Follow-up vascular surgery for occluded stents status post right common iliac left common iliac stenting with balloon angioplasty left common femoral and stent left SFA 11/16/2021 per Dr. [name]. Reviewed [name] and peroneal artery is occluded 100% 9.  Tumoral calcinosis.  Recent excision of tumoral calcinosis left hip thigh at [facility] 10/20/2021.  Weightbearing as tolerated. Communicated MRI results to ortho 10.  End-stage renal disease.  Continue hemodialysis as directed. Discussed improved Creatinine to 3.08 on 11/14.  11.  Acute on chronic anemia.  Continue iron supplement 12.  Leukocytosis.  (20k)  -Broad spectrum antibiotic therapy completed 11/16/2021 and monitor.  Latest blood culture showed no growth             -appearance of drainage idiscussed with ortho and is to be expected. Recent CT (10/30) demonstrating changes c/w tumoral calcinosis and mild cellulitis.              -MRI hip obtianed and stable              -  ischemic toes LLE could also be a source             -continue wound care as above             -pt feels well otherwise and no other signs of infection             -consider H/O consult if persistent  -WBC yesterday was 21.8, recheck ordered for Monday  13.  Asthma without exacerbation as well as remote history of tobacco abuse.  Continue inhalers as directed check oxygen saturations every shift 14. Diet controlled Diabetes mellitus. Blood sugar checks discontinued 15.  Hypertension.  Toprol-XL 25 mg daily.  Monitor with increased  mobility -11/24 well controlled, monitor    12/11/2021    5:55 AM 12/10/2021    7:34 PM 12/10/2021    1:28 PM  Vitals with BMI  Weight 132 lbs 4 oz    BMI 71.24    Systolic 580 998 338  Diastolic 42 41 49  Pulse 84 86 87    16.  Abrupt onset bilateral sensorineural hearing loss.  Confirmed by audiology 11/7 the symptoms have resolved.  Follow-up outpatient. 17.  Diastolic and systolic congestive heart failure.  Monitor for any signs of fluid overload. Daily weights ordered Filed Weights   12/09/21 1848 12/10/21 0500 12/11/21 0555  Weight: 58.1 kg 55.9 kg 60 kg  Weight overall stable/decreased  18. Pruritus: sarna lotion ordered.  19. Diarrhea: continue imodium  -11/23 Reports BM 2 days ago, order miralax prN in case constipation develops  -11/24 continue miralax prn-she would like to take medication after dialysis completed. She says she will ask for it later. Will add lactulose PRN for more severe constipation            - 11/25: Last recorded BM 11/17; took PRN miralax last night without results; per patient last BM 11/20. Ordered to give PRN lactulose tonight. +passing flatus  20. Insomnia: secondary to diarrhea, continue imodium.  21. Dysuria: UA/UC ordered -11/25 will stop diflucan   22. Chronic nausea  -Continue Zofran PRN  -Suspect GERD may be contributing, increase PPI to BID  -11/23 a little improved Monitor response to increased PPI frequency 23. GERD  -increase pantoprazole to BID 24. Necrotic left 2nd and 3rd digits of left foot with decreased pulses: consulted vascular and patient will likely need digit amputation, discussed with patient. Dilaudid started for severe pain 25. Tachycardia: likely secondary to pain, continue management as above 26. Opioid induced constipation: Sorbitol ordered   LOS: 18 days A FACE TO FACE EVALUATION WAS PERFORMED  Jerica Creegan P Allie Ousley 12/11/2021, 11:09 AM

## 2021-12-11 NOTE — Progress Notes (Signed)
Occupational Therapy Session Note  Patient Details  Name: Susan Horton MRN: [id number] Date of Birth: [birth year]  Today's Date: 12/11/2021 OT Individual Time: 4462-8638 OT Individual Time Calculation (min): 58 min    Short Term Goals: Week 3:  OT Short Term Goal 1 (Week 3): STG = LTG due to ELOS  Skilled Therapeutic Interventions/Progress Updates:  Skilled OT intervention completed with focus on activity tolerance, toileting needs and positioning, bed level self-care. Pt received upright in bed with nursing trying to take meds however reporting nausea. No rated pain reported during session however pt remains unable to place BLE onto floor for WB transfers due to pain present. Nurse/MD aware. Therapist offered modifications, repositioning and emotional support for pain reduction throughout.  Per MD verbal order- pt cleared for therapy today as tolerated. However pt with increased confusion, delayed processing and low activity tolerance with significantly increased time needed for all tasks.  Pt requested urgent need to have BM. Therapist attempted to retrieve wide drop arm BSC for safety with slide board toilet transfer however unable and attempted to locate bucket for current BSC in room for bedside transfer vs > w/c then commode however also unable to locate. Deferred to use of bed pan due to urgency, pain, lethargy and lack of needed items.   Pt transitioned into bed and was able to slowly roll R<>L with supervision and use of bed rails for removal of clothing and placement of pan with mod-max A. Attempted BM with time needed and assist from therapist for repositioning due to complexities of pain/wound management, however pt unsuccessful. MD notified of constipation as nursing reported it has been since 11/26 since last BM, also notified MD of pt status with the increased confusion etc.  Assisted pt with peri-care with total A, then threaded new underwear/pad and pants with use of rolling and  1 legged bridging on RLE with overall max A for time. Pt semi-upright in bed, with bed alarm on/activated, nurse notified of wound dressing status needing to be changed, and with all needs in reach at end of session.   Therapy Documentation Precautions:  Precautions Precautions: Fall Precaution Comments: L foot drop, bilateral intermittent hearing loss Restrictions Weight Bearing Restrictions: No    Therapy/Group: Individual Therapy  Blase Mess, MS, OTR/L  12/11/2021, 9:36 AM

## 2021-12-11 NOTE — Progress Notes (Signed)
Tentative plan for lower extremity angiogram on Tuesday per [facility].

## 2021-12-11 NOTE — Progress Notes (Signed)
Occupational Therapy Session Note  Patient Details  Name: Susan Horton MRN: [id number] Date of Birth: 1970-05-11  Today's Date: 12/11/2021 OT Individual Time: 1003-1053 OT Individual Time Calculation (min): 50 min   Today's Date: 12/11/2021 OT Missed Time: 10 Minutes Missed Time Reason: Nursing care   Short Term Goals: Week 3:  OT Short Term Goal 1 (Week 3): STG = LTG due to ELOS  Skilled Therapeutic Interventions/Progress Updates:  Pt received supine in bed for skilled OT session with focus on UE strengthening and overall activity tolerance. Pt agreeable to interventions, despite being significantly fatigued/lethargic.  Pt reported 9/10 pain, "I think I threw them back up though" in reference to pain medication administered earlier in the morning. OT offering rest breaks and positioning suggestions throughout session to address pain/fatigue and maximize participation/safety in session.  Pt repositioned in bed upon start of session due to hip misalignment and BLEs in guarded position, pt's BLEs floated with blanket placed between legs to decrease likelihood of muscle stiffness.  Wound dressing changed during session. Pt requiring increased levels of assistance to don underwear/pants over hips/bottom post dressing change. Pt able to roll R/L with increased time and use of bed rails, pt initiates task requiring Mod A to complete donning and fabric management in supine position.  Pt then participates in bed-level UE strengthening exercises using a red thera-band. Pt completes 1-2 sets/10 reps of the following exercises: -horizontal shoulder ab/adduction -bicep curls  Pt requiring increased rest breaks between reps and sets.   Pt remained supine in bed with all immediate needs met and bed alarm activated at end of session. Pt continues to be appropriate for skilled OT intervention to promote further functional independence.    Therapy Documentation Precautions:  Precautions Precautions:  Fall Precaution Comments: L foot drop, bilateral intermittent hearing loss Restrictions Weight Bearing Restrictions: No  Therapy/Group: Individual Therapy  Maudie Mercury, OTR/L, MSOT  12/11/2021, 10:24 AM

## 2021-12-11 NOTE — Progress Notes (Signed)
Occupational Therapy Session Note  Patient Details  Name: Susan Horton MRN: [id number] Date of Birth: 09-28-1970  Today's Date: 12/11/2021 OT Individual Time: 4917-9150 OT Individual Time Calculation (min): 24 min    Short Term Goals: Week 3:  OT Short Term Goal 1 (Week 3): STG = LTG due to ELOS  Skilled Therapeutic Interventions/Progress Updates:    Patient received supine in bed, eating crackers.  Patient reports nausea and vomiting but has not vomited in last few minutes.  Patient reports 10/10 pain in leg.  Patient willing to try to get up to wheelchair, but waiting for nursing to come back to change dressing on left hip.  Patient concerned that she threw up her pain medication, however during the session, patient showed signs of confusion - reporting - "that is my medicine - I am getting goofed up."  Patient unable initially to name her four children.  Repeating herself - but not speaking logically - e.g. oldest daughter is 21, next oldest daughter is 6, then 17.  When cued able to correct with increased time.  Patient able to state support post discharge, but unable to clearly state reason for hospitalization / series of events leading to today.  Patient unable to get up to wheelchair this session due to limited time and awaiting dressing change.   Patient left in modified sidelying in bed, bed alarm on, and call bell / bedside table beside her.  Therapy Documentation Precautions:  Precautions Precautions: Fall Precaution Comments: L foot drop, bilateral intermittent hearing loss Restrictions Weight Bearing Restrictions: No  Pain: Pain Assessment Pain Score: 10/10 LE    Therapy/Group: Individual Therapy  Mariah Milling 12/11/2021, 12:14 PM

## 2021-12-11 NOTE — Progress Notes (Signed)
Physical Therapy Session Note  Patient Details  Name: Susan Horton MRN: [id number] Date of Birth: Nov 26, 1970  Today's Date: 12/11/2021 PT Missed Time: 65 Minutes Missed Time Reason: Patient ill (Comment);Patient fatigue;Pain;Patient unwilling to participate (nauseous)  Short Term Goals: Week 2:  PT Short Term Goal 1 (Week 2): pt will transfer sit<>stand with LRAD and consistant SUP PT Short Term Goal 1 - Progress (Week 2): Progressing toward goal PT Short Term Goal 2 (Week 2): pt will transfer bed<>chair with LRAD and consistant SUP PT Short Term Goal 2 - Progress (Week 2): Progressing toward goal PT Short Term Goal 3 (Week 2): pt will ambulate 91f with LRAD and SUP PT Short Term Goal 3 - Progress (Week 2): Progressing toward goal PT Short Term Goal 4 (Week 2): Pt will initiate stair training. PT Short Term Goal 4 - Progress (Week 2): Met Week 3:  PT Short Term Goal 1 (Week 3): STG=LTG due to LOS   Skilled Therapeutic Interventions/Progress Updates:  Pt asleep in bed on entrance to room. Unable to rouse. Left to sleep and returned in 160m. Extremely difficult to rouse. Pt sleepily endorsing continued pain, nausea, fatigue and inability to stand. Requests not to participate in therapy this day.   Honored pt's request and left to rest prior to afternoon dialysis appointment.   Pt missed 45 min of skilled therapy due to nausea, fatigue, pain. Will re-attempt as schedule and pt availability permits.    Therapy Documentation Precautions:  Precautions Precautions: Fall Precaution Comments: L foot drop, bilateral intermittent hearing loss Restrictions Weight Bearing Restrictions: No General: PT Amount of Missed Time (min): 45 Minutes PT Missed Treatment Reason: Patient ill (Comment);Patient fatigue;Pain;Patient unwilling to participate (nauseous) Vital Signs:  Pain: Pain Assessment Pain Score: 5   Therapy/Group: Individual Therapy  JuAlger SimonsT, DPT, CSRS 12/11/2021, 12:11  PM

## 2021-12-11 NOTE — H&P (View-Only) (Signed)
    Subjective  -   Continued pain in both lower extremities   Physical Exam:  Stable appearance of necrotic left toes.  She is very hyper sensate bilaterally.       Assessment/Plan:    Lower extremity atherosclerotic vascular disease: Her duplex ultrasound showed multiple areas of 50 to 74% stenosis bilaterally.  I discussed with her that I feel she is going to require at minimum toe amputation on the left.  She recently had left leg intervention by Dr. [name].  There appears to be early stenosis.  Because she is at risk for limb loss, I think she needs to undergo angiography again to confirm that her blood flow is optimized.  I will plan on doing this on Tuesday.  Podiatry should be consulted for management of the toes.  Susan Horton 12/11/2021 7:06 PM --  Vitals:   12/11/21 1830 12/11/21 1900  BP: (!) 95/51 (!) 103/53  Pulse: 95 100  Resp: 15 12  Temp:    SpO2: 98%     Intake/Output Summary (Last 24 hours) at 12/11/2021 1906 Last data filed at 12/11/2021 1200 Gross per 24 hour  Intake 0 ml  Output --  Net 0 ml     Laboratory CBC    Component Value Date/Time   WBC 14.4 (H) 12/09/2021 1253   HGB 8.5 (L) 12/09/2021 1253   HGB 9.2 (L) 07/26/2016 0000   HCT 28.7 (L) 12/09/2021 1253   HCT 30.7 (L) 07/26/2016 0000   PLT 450 (H) 12/09/2021 1253   PLT 474 (H) 07/26/2016 0000    BMET    Component Value Date/Time   NA 135 12/09/2021 1253   NA 132 (L) 07/26/2016 0000   K 4.4 12/09/2021 1253   CL 88 (L) 12/09/2021 1253   CO2 27 12/09/2021 1253   GLUCOSE 80 12/09/2021 1253   BUN 43 (H) 12/09/2021 1253   BUN 39 (H) 07/26/2016 0000   CREATININE 5.05 (H) 12/09/2021 1253   CALCIUM 8.0 (L) 12/09/2021 1253   CALCIUM 8.4 (L) 06/21/2017 1939   GFRNONAA 10 (L) 12/09/2021 1253   GFRAA 6 (L) 06/08/2019 1506    COAG Lab Results  Component Value Date   INR 1.3 (H) 11/09/2021   INR 1.2 11/08/2021   INR 1.1 01/09/2019   No results found for:  "PTT"  Antibiotics Anti-infectives (From admission, onward)    Start     Dose/Rate Route Frequency Ordered Stop   12/01/21 1800  fluconazole (DIFLUCAN) tablet 100 mg  Status:  Discontinued       See Hyperspace for full Linked Orders Report.   100 mg Oral Daily 11/30/21 1553 12/04/21 1540   11/30/21 1645  fluconazole (DIFLUCAN) IVPB 100 mg  Status:  Discontinued       See Hyperspace for full Linked Orders Report.   100 mg 50 mL/hr over 60 Minutes Intravenous  Once 11/30/21 1553 12/04/21 1540        V. Leia Alf, M.D., [facility] Office: [phone] Pager:  [phone]

## 2021-12-11 NOTE — Progress Notes (Signed)
   12/11/21 1915  Vitals  Temp 98.6 F (37 C)  BP (!) 106/56  MAP (mmHg) 72  Pulse Rate (!) 108  ECG Heart Rate 98  Resp 17  Post Treatment  Dialyzer Clearance Lightly streaked  Duration of HD Treatment -hour(s) 2.5 hour(s)  Liters Processed 67.9  Fluid Removed (mL) 1 mL  Tolerated HD Treatment No (Comment)  Post-Hemodialysis Comments PT hpo most of the time, off 20 min.s early  AVG/AVF Arterial Site Held (minutes) 5 minutes  AVG/AVF Venous Site Held (minutes) 5 minutes   TX fin. W/ 20 min.s off early at PT's demand.

## 2021-12-11 NOTE — Progress Notes (Signed)
   12/11/21 1915  Vitals  Temp 98.6 F (37 C)  BP (!) 106/56  MAP (mmHg) 72  Pulse Rate (!) 108  ECG Heart Rate 98  Resp 17  Post Treatment  Dialyzer Clearance Lightly streaked  Duration of HD Treatment -hour(s) 2.5 hour(s)  Liters Processed 67.9  Fluid Removed (mL) 1 mL  Tolerated HD Treatment No (Comment)  Post-Hemodialysis Comments PT hpo most of the time, off 20 min.s early  AVG/AVF Arterial Site Held (minutes) 5 minutes  AVG/AVF Venous Site Held (minutes) 5 minutes   TX fin w/ 20 mkins off early at pt's demand

## 2021-12-11 NOTE — Progress Notes (Signed)
[facility] Progress Note   Subjective:    Seen and examined patient at bedside. No acute complaints currently. Plan for HD this afternoon.  Objective Vitals:   12/10/21 1328 12/10/21 1934 12/11/21 0555 12/11/21 1339  BP: (!) 118/49 (!) 113/41 (!) 105/42 (!) 106/49  Pulse: 87 86 84 86  Resp: '16 16 18 14  '$ Temp: 98.8 F (37.1 C) 98.2 F (36.8 C) 98.5 F (36.9 C) 98.4 F (36.9 C)  TempSrc: Oral Oral Oral Oral  SpO2: 97% 94% 97% 100%  Weight:   60 kg   Height:       Physical Exam General:ill appearing female in NAD Heart:RRR Lungs:CTAB Abdomen:soft, NTND Extremities:no LE edema Dialysis Access: LU AVF +b/t  Filed Weights   12/09/21 1848 12/10/21 0500 12/11/21 0555  Weight: 58.1 kg 55.9 kg 60 kg    Intake/Output Summary (Last 24 hours) at 12/11/2021 1507 Last data filed at 12/11/2021 0700 Gross per 24 hour  Intake 0 ml  Output --  Net 0 ml    Additional Objective Labs: Basic Metabolic Panel: Recent Labs  Lab 12/09/21 1253  NA 135  K 4.4  CL 88*  CO2 27  GLUCOSE 80  BUN 43*  CREATININE 5.05*  CALCIUM 8.0*  PHOS 6.7*   Liver Function Tests: Recent Labs  Lab 12/09/21 1253  ALBUMIN 2.0*   No results for input(s): "LIPASE", "AMYLASE" in the last 168 hours. CBC: Recent Labs  Lab 12/07/21 0552 12/09/21 1253  WBC 17.3* 14.4*  NEUTROABS 14.7* 12.6*  HGB 7.0* 8.5*  HCT 23.7* 28.7*  MCV 79.3* 78.8*  PLT 365 450*   Blood Culture    Component Value Date/Time   SDES WOUND HIP 11/12/2021 1425   SPECREQUEST  LEFT 11/12/2021 1425   CULT  11/12/2021 1425    NO GROWTH 2 DAYS Performed at [facility], [place] [address], [place], [place] [address]    REPTSTATUS 11/14/2021 FINAL 11/12/2021 1425    Cardiac Enzymes: No results for input(s): "CKTOTAL", "CKMB", "CKMBINDEX", "TROPONINI" in the last 168 hours. CBG: No results for input(s): "GLUCAP" in the last 168 hours. Iron Studies: No results for input(s): "IRON", "TIBC",  "TRANSFERRIN", "FERRITIN" in the last 72 hours. Lab Results  Component Value Date   INR 1.3 (H) 11/09/2021   INR 1.2 11/08/2021   INR 1.1 01/09/2019   Studies/Results: VAS [name] LOWER EXTREMITY ARTERIAL DUPLEX  Result Date: 12/10/2021 LOWER EXTREMITY ARTERIAL DUPLEX STUDY Patient Name:  BREZLYN MANRIQUE  Date of Exam:   12/10/2021 Medical Rec #: [id number]      Accession #:    [id number] Date of Birth: [birth year]       Patient Gender: F Patient Age:   51 years Exam Location:  [facility] Procedure:      VAS [name] LOWER EXTREMITY ARTERIAL DUPLEX Referring Phys: EMMA COLLINS --------------------------------------------------------------------------------  Indications: Rest pain, peripheral artery disease, and severe bilateral lower              extremity pain with ischemic changes post-op. High Risk Factors: Hypertension, Diabetes, coronary artery disease.  Current ABI: N/A Limitations: Patient pain, unable to change position, tissue changes Comparison Study: 11-16-2021 Prior abdominal aortogram with intervention- stent                   of RT CIA, stent of LT CIA, atherectomy of LT CFA and SFA,  stent of LT SFA.                    Patient history of LT EIA and LT SFA stent prior to aortogram. Performing Technologist: Darlin Coco RDMS, RVT  Examination Guidelines: A complete evaluation includes B-mode imaging, spectral Doppler, color Doppler, and power Doppler as needed of all accessible portions of each vessel. Bilateral testing is considered an integral part of a complete examination. Limited examinations for reoccurring indications may be performed as noted.  +-----------+--------+-----+---------------+-----------------+-----------------+ RIGHT      PSV cm/sRatioStenosis       Waveform         Comments          +-----------+--------+-----+---------------+-----------------+-----------------+ CIA Mid    116                         monophasic       Turbulent          +-----------+--------+-----+---------------+-----------------+-----------------+ EIA Mid    220          50-74% stenosismonophasic       Turbulent         +-----------+--------+-----+---------------+-----------------+-----------------+ CFA Prox   159                         monophasic       Turbulent         +-----------+--------+-----+---------------+-----------------+-----------------+ CFA Mid    292          50-74% stenosismonophasic       Calcific plaque   +-----------+--------+-----+---------------+-----------------+-----------------+ CFA Distal 209                         monophasic       Turbulent         +-----------+--------+-----+---------------+-----------------+-----------------+ DFA        102                         monophasic                         +-----------+--------+-----+---------------+-----------------+-----------------+ SFA Prox   16                          dampened                                                                  monophasic                         +-----------+--------+-----+---------------+-----------------+-----------------+ SFA Mid    69      4:1  50-74% stenosismonophasic       Focal elevation                                                           at the prox-mid  segment           +-----------+--------+-----+---------------+-----------------+-----------------+ SFA Distal 41                          monophasic       Collateral        +-----------+--------+-----+---------------+-----------------+-----------------+ POP Prox   23                                                             +-----------+--------+-----+---------------+-----------------+-----------------+ POP Mid    23                                                             +-----------+--------+-----+---------------+-----------------+-----------------+ POP  Distal 30                                                             +-----------+--------+-----+---------------+-----------------+-----------------+ TP Trunk   16                          dampened         Limited views                                            monophasic       demonstrate poor                                                          perfusion of the                                                          TPT               +-----------+--------+-----+---------------+-----------------+-----------------+ PTA Prox   25                          dampened                                                                  monophasic                         +-----------+--------+-----+---------------+-----------------+-----------------+  PTA Mid    16                          dampened                                                                  monophasic                         +-----------+--------+-----+---------------+-----------------+-----------------+ PTA Distal 9                           dampened                                                                  monophasic                         +-----------+--------+-----+---------------+-----------------+-----------------+ PERO Prox               occluded       absent                             +-----------+--------+-----+---------------+-----------------+-----------------+ PERO Mid                occluded                                          +-----------+--------+-----+---------------+-----------------+-----------------+ PERO Distal             occluded                                          +-----------+--------+-----+---------------+-----------------+-----------------+ DP                      occluded       absent                             +-----------+--------+-----+---------------+-----------------+-----------------+   +----------+--------+-----+---------------+------------------+-----------------+ LEFT      PSV cm/sRatioStenosis       Waveform          Comments          +----------+--------+-----+---------------+------------------+-----------------+ EIA Mid                                                 Not well  visualized                                                                secondary to                                                              bowel and patient                                                         position          +----------+--------+-----+---------------+------------------+-----------------+ CFA Prox  241                         monophasic        Turbulent         +----------+--------+-----+---------------+------------------+-----------------+ CFA Mid   211                         monophasic                          +----------+--------+-----+---------------+------------------+-----------------+ CFA Distal282          50-74% stenosismonophasic        Turbulent         +----------+--------+-----+---------------+------------------+-----------------+ DFA       208                         monophasic                          +----------+--------+-----+---------------+------------------+-----------------+ SFA Prox  89                          monophasic        Turbulent         +----------+--------+-----+---------------+------------------+-----------------+ SFA Mid   46                          monophasic                          +----------+--------+-----+---------------+------------------+-----------------+ SFA Distal100          50-74% stenosismonophasic        Focal elevation                                                           at mid-distal  with distal                                                                velocity of 45                                                            cm/s              +----------+--------+-----+---------------+------------------+-----------------+ POP Prox  91                          monophasic                          +----------+--------+-----+---------------+------------------+-----------------+ POP Mid   76                          monophasic                          +----------+--------+-----+---------------+------------------+-----------------+ POP Distal71                          monophasic                          +----------+--------+-----+---------------+------------------+-----------------+ TP Trunk                                                Unable to                                                                 insonate                                                                  secondary to                                                              patient position  +----------+--------+-----+---------------+------------------+-----------------+ PTA Prox  16                          dampened  monophasic                          +----------+--------+-----+---------------+------------------+-----------------+ PTA Mid   45                          monophasic                          +----------+--------+-----+---------------+------------------+-----------------+ PTA Distal44                          dampened          Limited views                                           monophasic        secondary to                                                              patient position                                                          demonstrated                                                              limited vessel                                                             perfusion         +----------+--------+-----+---------------+------------------+-----------------+ PERO Prox              occluded       absent                              +----------+--------+-----+---------------+------------------+-----------------+ DP                     occluded       absent                              +----------+--------+-----+---------------+------------------+-----------------+  Summary: Right: 50-74% stenosis noted in the external iliac segment. 50-74% stenosis noted in the common femoral artery. 50-74% stenosis noted in the superficial femoral artery. Total occlusion noted in the peroneal artery. Total occlusion noted in the dorsal pedis artery. Turbulent common iliac flow suggestive of more proximal obstruction. Left: 50-74% stenosis noted in the common femoral  artery. 50-74% stenosis noted in the superficial femoral artery. Total occlusion noted in the peroneal artery. Total occlusion noted in the dorsal pedis artery. Views of the iliac segments were limited secondary to patient position and bowel. Turbulent common femoral flow suggestive of more proximal obstruction.   See table(s) above for measurements and observations. Electronically signed by Jamelle Haring on 12/10/2021 at 5:27:56 PM.    Final     Medications:   (feeding supplement) PROSource Plus  30 mL Oral BID BM   aspirin  81 mg Oral Daily   atorvastatin  40 mg Oral Daily   Chlorhexidine Gluconate Cloth  6 each Topical Q0600   clopidogrel  75 mg Oral Q breakfast   darbepoetin (ARANESP) injection - NON-DIALYSIS  200 mcg Subcutaneous Q Wed-1800   doxercalciferol  7 mcg Intravenous Q M,W,F-HD   DULoxetine  40 mg Oral Daily   heparin  5,000 Units Subcutaneous Q8H   lanthanum  1,000 mg Oral Q supper   leptospermum manuka honey  1 Application Topical Daily   melatonin  5 mg Oral QHS   metoCLOPramide  10 mg Oral TID AC   metoprolol succinate  12.5 mg  Oral Daily   montelukast  10 mg Oral QHS   pantoprazole  40 mg Oral BID   pneumococcal 20-valent conjugate vaccine  0.5 mL Intramuscular Tomorrow-1000   sorbitol  30 mL Oral Once   thiamine  100 mg Oral Daily    Dialysis Orders: [facility]WF [facility] 3h 51mn 400/600  61kg  2/2 bath P2  Hep 4000  LUE AVF - mircera 200 mcg IV q2, last 10/16, due 10/30 - venofer '100mg'$  IV tiw thru 11/06 - doxercalciferol 7 ug tiw - sod thiosulfate 25 gm iv tiw  Assessment/Plan: Septic shock - sp course of Zosyn, now resolved -> CIR d/t debility  ESRD: Required CRRT on admit, now back to iHD MWF. On schedule next Tx 12/1: 3h AVF, Tight heparin 2L UF  HTN/volume: BP controlled, mostly euvolemic on exam.  Anemia of ESRD: Hgb^8.5. Continue max dose Aranesp 2043m weekly while here. No IV Fe with ferritin >5000. Transfuse prn for Hgb<7 Secondary HPTH: CorrCa ok, Phos high. She has trouble tolerating any binder secondary to gastroparesis, trying Fosrenol once daily for now. Nutrition: Alb low, continue supplements sometimes refusing Nepro. Renal diet w/fluid restrictions.  Encephalopathy - D/t shock/meds? better. Had some hearing loss and foot drop 11/7 now spontaneously improving. No offending meds Myoclonus: severe, after initial consult. Felt due to meds (cefepime, gabapentin and narcotics). Resolved.  Tumoral calcinosis L hip - long-standing painful issue for her, on NaThio empirically as op, although hasn't helped. [facility]. Recultured 11/2 - NGTD. F/u with ortho this week.  T2DM with severe gastroparesis - Ongoing issue, s/p esophageal myotomy 09/17/21. Has issues swallow/keeping pills down. L foot pain - Xray negative, uric acid normal. [facility] consulted, s/p LE angiography with improvement in pain. Venous [place]/no evidence of DVT.  Now worse again. MRI yesterday with diffuse edema through musculature - likely denervation versus myositis. [facility] consulting, gangrenous changes of toes, likely needs  at minimal digital amputation.  Leukocytosis - persistent. Per PMD. Dispo: Tentative d/c plan of 12/19/21  CoTobie PoetNP [facility] 12/11/2021,3:07 PM  LOS: 18 days

## 2021-12-11 NOTE — Progress Notes (Signed)
Patient ID: Susan Horton, female   DOB: 1970-12-31, 51 y.o.   MRN: [id number]  [facility] ordered through [facility].

## 2021-12-11 NOTE — Progress Notes (Signed)
    Subjective  -   Continued pain in both lower extremities   Physical Exam:  Stable appearance of necrotic left toes.  She is very hyper sensate bilaterally.       Assessment/Plan:    Lower extremity atherosclerotic vascular disease: Her duplex ultrasound showed multiple areas of 50 to 74% stenosis bilaterally.  I discussed with her that I feel she is going to require at minimum toe amputation on the left.  She recently had left leg intervention by Dr. [name].  There appears to be early stenosis.  Because she is at risk for limb loss, I think she needs to undergo angiography again to confirm that her blood flow is optimized.  I will plan on doing this on Tuesday.  Podiatry should be consulted for management of the toes.  Susan Horton 12/11/2021 7:06 PM --  Vitals:   12/11/21 1830 12/11/21 1900  BP: (!) 95/51 (!) 103/53  Pulse: 95 100  Resp: 15 12  Temp:    SpO2: 98%     Intake/Output Summary (Last 24 hours) at 12/11/2021 1906 Last data filed at 12/11/2021 1200 Gross per 24 hour  Intake 0 ml  Output --  Net 0 ml     Laboratory CBC    Component Value Date/Time   WBC 14.4 (H) 12/09/2021 1253   HGB 8.5 (L) 12/09/2021 1253   HGB 9.2 (L) 07/26/2016 0000   HCT 28.7 (L) 12/09/2021 1253   HCT 30.7 (L) 07/26/2016 0000   PLT 450 (H) 12/09/2021 1253   PLT 474 (H) 07/26/2016 0000    BMET    Component Value Date/Time   NA 135 12/09/2021 1253   NA 132 (L) 07/26/2016 0000   K 4.4 12/09/2021 1253   CL 88 (L) 12/09/2021 1253   CO2 27 12/09/2021 1253   GLUCOSE 80 12/09/2021 1253   BUN 43 (H) 12/09/2021 1253   BUN 39 (H) 07/26/2016 0000   CREATININE 5.05 (H) 12/09/2021 1253   CALCIUM 8.0 (L) 12/09/2021 1253   CALCIUM 8.4 (L) 06/21/2017 1939   GFRNONAA 10 (L) 12/09/2021 1253   GFRAA 6 (L) 06/08/2019 1506    COAG Lab Results  Component Value Date   INR 1.3 (H) 11/09/2021   INR 1.2 11/08/2021   INR 1.1 01/09/2019   No results found for:  "PTT"  Antibiotics Anti-infectives (From admission, onward)    Start     Dose/Rate Route Frequency Ordered Stop   12/01/21 1800  fluconazole (DIFLUCAN) tablet 100 mg  Status:  Discontinued       See Hyperspace for full Linked Orders Report.   100 mg Oral Daily 11/30/21 1553 12/04/21 1540   11/30/21 1645  fluconazole (DIFLUCAN) IVPB 100 mg  Status:  Discontinued       See Hyperspace for full Linked Orders Report.   100 mg 50 mL/hr over 60 Minutes Intravenous  Once 11/30/21 1553 12/04/21 1540        V. Leia Alf, M.D., [facility] [place] Office: [phone] Pager:  [phone]

## 2021-12-12 DIAGNOSIS — I96 Gangrene, not elsewhere classified: Secondary | ICD-10-CM

## 2021-12-12 NOTE — Progress Notes (Signed)
Physical Therapy Session Note  Patient Details  Name: Susan Horton MRN: [id number] Date of Birth: [birth year]  Today's Date: 12/12/2021 PT Individual Time: 9373-4287 PT Individual Time Calculation (min): 27 min  and Today's Date: 12/12/2021 PT Missed Time: 18 Minutes Missed Time Reason: Patient ill (Comment);Patient fatigue;Pain  Short Term Goals: Week 1:  PT Short Term Goal 1 (Week 1): pt will transfer sit<>stand with LRAD and mod A consistantly PT Short Term Goal 1 - Progress (Week 1): Met PT Short Term Goal 2 (Week 1): pt will transfer bed<>chair with LRAD and min A PT Short Term Goal 2 - Progress (Week 1): Met PT Short Term Goal 3 (Week 1): pt will ambulate 61f with LRAD and mod A PT Short Term Goal 3 - Progress (Week 1): Met Week 2:  PT Short Term Goal 1 (Week 2): pt will transfer sit<>stand with LRAD and consistant SUP PT Short Term Goal 1 - Progress (Week 2): Progressing toward goal PT Short Term Goal 2 (Week 2): pt will transfer bed<>chair with LRAD and consistant SUP PT Short Term Goal 2 - Progress (Week 2): Progressing toward goal PT Short Term Goal 3 (Week 2): pt will ambulate 751fwith LRAD and SUP PT Short Term Goal 3 - Progress (Week 2): Progressing toward goal PT Short Term Goal 4 (Week 2): Pt will initiate stair training. PT Short Term Goal 4 - Progress (Week 2): Met Week 3:  PT Short Term Goal 1 (Week 3): STG=LTG due to LOS  Skilled Therapeutic Interventions/Progress Updates:  Patient supine in bed on entrance to room. Completing breakfast. Patient alert and agreeable to PT session initially although endorses 10/10 pain at Bil feet. Is not sure that she can participate in much. Pt is also not sure of current plan. Opened chart and discussed MD's plan to re-do angiogram to LLE in order to confirm current blood flow and places of decreased reach of blood flow. This will help to determine plan for toe amputations. Angiogram scheduled for Tuesday. Then MD will discuss more with  pt concerning plan for surgery. Pt thankful for info.   Noted new BSC to go home with pt. Discussed other potential needs. Pt relates she will have to pay for a w/c since she recently received a power scooter at home. Discussed differences in heights and accessibility to seat of scooter and related potential need to practice to power w/c options we have here that are more similar to pt's scooter. May require a longer slide board since base may be larger. Pt may also require longer slide board for transfers to/ from car for appointments. Pt not willing to attempt transfers this day d/t increased pain, increased fatigue from later HD appt yesterday evening, pain medication, and loose bowels this morning. Repositioned in bed to float feet d/t increased pain with touching Bil feet to anything. Pt able to use BUE to assist with lift of RLE and AROM for lift of LLE to pillow. Is able to demonstrate some LE mobility in order to participate in continued LE movement and therex.   Patient supine in bed at end of session with brakes locked, bed alarm set, and all needs within reach.   Therapy Documentation Precautions:  Precautions Precautions: Fall Precaution Comments: L foot drop, bilateral intermittent hearing loss Restrictions Weight Bearing Restrictions: No General: PT Amount of Missed Time (min): 18 Minutes PT Missed Treatment Reason: Patient ill (Comment);Patient fatigue;Pain Vital Signs:  Pain: Pain Assessment Pain Scale: 0-10 Pain Score: 10-Worst pain  ever Faces Pain Scale: Hurts little more Pain Type: Acute pain Pain Location: Leg Pain Orientation: Right;Left Pain Radiating Towards: feet Pain Descriptors / Indicators: Aching Pain Frequency: Constant Pain Onset: On-going Patients Stated Pain Goal: 2 Pain Intervention(s): Medication (See eMAR)  Therapy/Group: Individual Therapy  Alger Simons PT, DPT, CSRS 12/12/2021, 8:32 AM

## 2021-12-12 NOTE — Progress Notes (Signed)
PROGRESS NOTE   Subjective/Complaints:  C/o ongoing pain in both legs and feet.   ROS: Patient denies fever, rash, sore throat, blurred vision, dizziness, nausea, vomiting, diarrhea, cough, shortness of breath or chest pain, headache, or mood change.   Objective:   VAS [name] LOWER EXTREMITY ARTERIAL DUPLEX  Result Date: 12/10/2021 LOWER EXTREMITY ARTERIAL DUPLEX STUDY Patient Name:  Susan Horton  Date of Exam:   12/10/2021 Medical Rec #: [id number]      Accession #:    [id number] Date of Birth: 1970-07-21       Patient Gender: F Patient Age:   51 years Exam Location:  [facility] Procedure:      VAS [name] LOWER EXTREMITY ARTERIAL DUPLEX Referring Phys: EMMA COLLINS --------------------------------------------------------------------------------  Indications: Rest pain, peripheral artery disease, and severe bilateral lower              extremity pain with ischemic changes post-op. High Risk Factors: Hypertension, Diabetes, coronary artery disease.  Current ABI: N/A Limitations: Patient pain, unable to change position, tissue changes Comparison Study: 11-16-2021 Prior abdominal aortogram with intervention- stent                   of RT CIA, stent of LT CIA, atherectomy of LT CFA and SFA,                   stent of LT SFA.                    Patient history of LT EIA and LT SFA stent prior to aortogram. Performing Technologist: Darlin Coco RDMS, RVT  Examination Guidelines: A complete evaluation includes B-mode imaging, spectral Doppler, color Doppler, and power Doppler as needed of all accessible portions of each vessel. Bilateral testing is considered an integral part of a complete examination. Limited examinations for reoccurring indications may be performed as noted.  +-----------+--------+-----+---------------+-----------------+-----------------+ RIGHT      PSV cm/sRatioStenosis       Waveform         Comments           +-----------+--------+-----+---------------+-----------------+-----------------+ CIA Mid    116                         monophasic       Turbulent         +-----------+--------+-----+---------------+-----------------+-----------------+ EIA Mid    220          50-74% stenosismonophasic       Turbulent         +-----------+--------+-----+---------------+-----------------+-----------------+ CFA Prox   159                         monophasic       Turbulent         +-----------+--------+-----+---------------+-----------------+-----------------+ CFA Mid    292          50-74% stenosismonophasic       Calcific plaque   +-----------+--------+-----+---------------+-----------------+-----------------+ CFA Distal 209                         monophasic  Turbulent         +-----------+--------+-----+---------------+-----------------+-----------------+ DFA        102                         monophasic                         +-----------+--------+-----+---------------+-----------------+-----------------+ SFA Prox   16                          dampened                                                                  monophasic                         +-----------+--------+-----+---------------+-----------------+-----------------+ SFA Mid    69      4:1  50-74% stenosismonophasic       Focal elevation                                                           at the prox-mid                                                           segment           +-----------+--------+-----+---------------+-----------------+-----------------+ SFA Distal 41                          monophasic       Collateral        +-----------+--------+-----+---------------+-----------------+-----------------+ POP Prox   23                                                             +-----------+--------+-----+---------------+-----------------+-----------------+ POP  Mid    23                                                             +-----------+--------+-----+---------------+-----------------+-----------------+ POP Distal 30                                                             +-----------+--------+-----+---------------+-----------------+-----------------+ TP Trunk   16  dampened         Limited views                                            monophasic       demonstrate poor                                                          perfusion of the                                                          TPT               +-----------+--------+-----+---------------+-----------------+-----------------+ PTA Prox   25                          dampened                                                                  monophasic                         +-----------+--------+-----+---------------+-----------------+-----------------+ PTA Mid    16                          dampened                                                                  monophasic                         +-----------+--------+-----+---------------+-----------------+-----------------+ PTA Distal 9                           dampened                                                                  monophasic                         +-----------+--------+-----+---------------+-----------------+-----------------+ PERO Prox               occluded       absent                             +-----------+--------+-----+---------------+-----------------+-----------------+  PERO Mid                occluded                                          +-----------+--------+-----+---------------+-----------------+-----------------+ PERO Distal             occluded                                          +-----------+--------+-----+---------------+-----------------+-----------------+ DP                       occluded       absent                             +-----------+--------+-----+---------------+-----------------+-----------------+  +----------+--------+-----+---------------+------------------+-----------------+ LEFT      PSV cm/sRatioStenosis       Waveform          Comments          +----------+--------+-----+---------------+------------------+-----------------+ EIA Mid                                                 Not well                                                                  visualized                                                                secondary to                                                              bowel and patient                                                         position          +----------+--------+-----+---------------+------------------+-----------------+ CFA Prox  241                         monophasic        Turbulent         +----------+--------+-----+---------------+------------------+-----------------+ CFA Mid   211  monophasic                          +----------+--------+-----+---------------+------------------+-----------------+ CFA Distal282          50-74% stenosismonophasic        Turbulent         +----------+--------+-----+---------------+------------------+-----------------+ DFA       208                         monophasic                          +----------+--------+-----+---------------+------------------+-----------------+ SFA Prox  89                          monophasic        Turbulent         +----------+--------+-----+---------------+------------------+-----------------+ SFA Mid   46                          monophasic                          +----------+--------+-----+---------------+------------------+-----------------+ SFA Distal100          50-74% stenosismonophasic        Focal elevation                                                            at mid-distal                                                             with distal                                                               velocity of 45                                                            cm/s              +----------+--------+-----+---------------+------------------+-----------------+ POP Prox  91                          monophasic                          +----------+--------+-----+---------------+------------------+-----------------+ POP Mid   76                          monophasic                          +----------+--------+-----+---------------+------------------+-----------------+  POP Distal71                          monophasic                          +----------+--------+-----+---------------+------------------+-----------------+ TP Trunk                                                Unable to                                                                 insonate                                                                  secondary to                                                              patient position  +----------+--------+-----+---------------+------------------+-----------------+ PTA Prox  16                          dampened                                                                  monophasic                          +----------+--------+-----+---------------+------------------+-----------------+ PTA Mid   45                          monophasic                          +----------+--------+-----+---------------+------------------+-----------------+ PTA Distal44                          dampened          Limited views                                           monophasic        secondary to  patient position                                                           demonstrated                                                              limited vessel                                                            perfusion         +----------+--------+-----+---------------+------------------+-----------------+ PERO Prox              occluded       absent                              +----------+--------+-----+---------------+------------------+-----------------+ DP                     occluded       absent                              +----------+--------+-----+---------------+------------------+-----------------+  Summary: Right: 50-74% stenosis noted in the external iliac segment. 50-74% stenosis noted in the common femoral artery. 50-74% stenosis noted in the superficial femoral artery. Total occlusion noted in the peroneal artery. Total occlusion noted in the dorsal pedis artery. Turbulent common iliac flow suggestive of more proximal obstruction. Left: 50-74% stenosis noted in the common femoral artery. 50-74% stenosis noted in the superficial femoral artery. Total occlusion noted in the peroneal artery. Total occlusion noted in the dorsal pedis artery. Views of the iliac segments were limited secondary to patient position and bowel. Turbulent common femoral flow suggestive of more proximal obstruction.   See table(s) above for measurements and observations. Electronically signed by Jamelle Haring on 12/10/2021 at 5:27:56 PM.    Final    Recent Labs    12/09/21 1253  WBC 14.4*  HGB 8.5*  HCT 28.7*  PLT 450*    Recent Labs    12/09/21 1253  NA 135  K 4.4  CL 88*  CO2 27  GLUCOSE 80  BUN 43*  CREATININE 5.05*  CALCIUM 8.0*     Intake/Output Summary (Last 24 hours) at 12/12/2021 0906 Last data filed at 12/12/2021 0800 Gross per 24 hour  Intake 120 ml  Output 1 ml  Net 119 ml         Physical Exam: Vital Signs Blood pressure 124/68, pulse (!) 102, temperature 97.9 F (36.6 C),  temperature source Oral, resp. rate 18, height '5\' 5"'$  (1.651 m), weight 57.2 kg, SpO2 99 %.  Constitutional: No distress . Vital signs reviewed. HEENT: NCAT, EOMI, oral membranes moist Neck: supple Cardiovascular: RRR without murmur. No JVD    Respiratory/Chest: CTA Bilaterally without wheezes or rales. Normal effort  GI/Abdomen: BS +, non-tender, non-distended Ext: no clubbing, cyanosis, or edema. LUE AVG Psych:flat but cooperative  Musculoskeletal:     Cervical back: Normal range of motion.     Comments: Left hip tender with ROM, appears swollen. Large dressing in place. Left foot hypersensitive to touch.  Skin:    Comments:  Left hip Xeroform dressing in place.  clumpy white drainage.  Left foot: tip of great toe and 2nd/third purple, necrotic.  AVF in LUE  Stage 2 to sacrum Neurological:     Mental Status: She is alert.     Comments:    Alert and oriented x 3. Follows commands, Normal insight and awareness. Intact Memory. Normal language and speech. CN 2-12 grossly intact. UE grossly 5/5. RLE 3+ to 4/5 prox to 4+/5 distally. LLE 2/5 prox to trace/5 ankle. Stocking glove sensory loss bilateral LE, L>R up to mid calf.         Assessment/Plan: 1. Functional deficits which require 3+ hours per day of interdisciplinary therapy in a comprehensive inpatient rehab setting. Physiatrist is providing close team supervision and 24 hour management of active medical problems listed below. Physiatrist and rehab team continue to assess barriers to discharge/monitor patient progress toward functional and medical goals  Care Tool:  Bathing    Body parts bathed by patient: Right arm, Left arm, Chest, Abdomen, Front perineal area, Right upper leg, Left upper leg, Right lower leg, Left lower leg, Face, Buttocks   Body parts bathed by helper: Buttocks     Bathing assist Assist Level: Contact Guard/Touching assist Assistive Device Comment: stand pivot, shower chair   Upper Body  Dressing/Undressing Upper body dressing   What is the patient wearing?: Pull over shirt    Upper body assist Assist Level: Independent with assistive device Assistive Device Comment: Seated on WC  Lower Body Dressing/Undressing Lower body dressing      What is the patient wearing?: Pants     Lower body assist Assist for lower body dressing: Moderate Assistance - Patient 50 - 74%     Toileting Toileting Toileting Activity did not occur Landscape architect and hygiene only): N/A (no void or bm)  Toileting assist Assist for toileting: Maximal Assistance - Patient 25 - 49%     Transfers Chair/bed transfer  Transfers assist     Chair/bed transfer assist level: Minimal Assistance - Patient > 75% (slideboard)     Locomotion Ambulation   Ambulation assist   Ambulation activity did not occur: Safety/medical concerns (pain, fatigue, weakness/deconditioning, decreased balance)  Assist level: Contact Guard/Touching assist Assistive device: Walker-rolling Max distance: 10   Walk 10 feet activity   Assist  Walk 10 feet activity did not occur: Safety/medical concerns (pain, fatigue, weakness/deconditioning, decreased balance)  Assist level: Contact Guard/Touching assist Assistive device: Walker-rolling   Walk 50 feet activity   Assist Walk 50 feet with 2 turns activity did not occur: Safety/medical concerns (pain, fatigue, weakness/deconditioning, decreased balance)         Walk 150 feet activity   Assist Walk 150 feet activity did not occur: Safety/medical concerns (pain, fatigue, weakness/deconditioning, decreased balance)         Walk 10 feet on uneven surface  activity   Assist Walk 10 feet on uneven surfaces activity did not occur: Safety/medical concerns (pain, fatigue, weakness/deconditioning, decreased balance)         Wheelchair     Assist Is the patient using a wheelchair?: Yes Type of Wheelchair: Manual    Wheelchair assist level:  Supervision/Verbal cueing Max wheelchair distance: 50    Wheelchair 50 feet with 2 turns activity    Assist        Assist Level: Supervision/Verbal cueing   Wheelchair 150 feet activity     Assist      Assist Level: Supervision/Verbal cueing   Blood pressure 124/68, pulse (!) 102, temperature 97.9 F (36.6 C), temperature source Oral, resp. rate 18, height '5\' 5"'$  (1.651 m), weight 57.2 kg, SpO2 99 %.    Medical Problem List and Plan: 1. Functional deficits secondary to septic shock/metabolic with debility and encephalopathy. Pt appears cognitively to be at baseline. -  Antibiotic therapy 10/29 - 11/6             -patient may shower if left hip is covered             -ELOS/Goals: 10-12 days, mod I to supervision goals  -Continue CIR therapies including PT, OT   -Seen by neuropsychology  working on coping/adjustment  2.  Impaired mobility: working on sit to stands. Provided accomodation letter for ramp and bathroom. -DVT/anticoagulation:  Pharmaceutical: continue Heparin given bleeding risk - intermittently refusing, educated patient on need 11/25. Discussed SCDs as an alternative.               -antiplatelet therapy: Aspirin 81 mg daily and Plavix 75 mg daily 3. Left foot pain, Chronic periopheral polyneuropathy (likely d/t DM 2): changed Lidoderm patch to bottom of right foot, oxycodone as needed. Provided list of foods for pain relief. Manuka honey ordered for wound healing. Avoid gabapentin since causes confusion. Increase Cymbalta to '30mg'$  daily. Discussed that MRI shows edema/myositis/denervation. Dilaudid started for severe pain  12/2 pt using meds intermittently. Encouraged her to use them as ordered when pain is increased. It will be hard to treat pain in left foot given necrotic toes 4. Insomnia: increase melatonin to '5mg'$  5. Neuropsych/cognition: This patient is capable of making decisions on her own behalf. 6. Left hip incision: Continue BID to TID dressing  changes to left hip. Discussed with patient that surgeon stated continued drainage is to be expected. Apply manuka honey.  -WOC follow-up for left hip drainage. -completed course of broad spectrum abx on 11/6   -Assistance provided by orthopedics. 7. Fluids/Electrolytes/Nutrition: Routine in and outs with follow-up chemistries 8.  Peripheral vascular disease.  Follow-up vascular surgery for occluded stents status post right common iliac left common iliac stenting with balloon angioplasty left common femoral and stent left SFA 11/16/2021 per Dr. [name]. Reviewed [name] and peroneal artery is occluded 100% 9.  Tumoral calcinosis.  Recent excision of tumoral calcinosis left hip thigh at [facility] 10/20/2021.  Weightbearing as tolerated. Communicated MRI results to ortho 10.  End-stage renal disease.  Continue hemodialysis as directed. Discussed improved Creatinine to 3.08 on 11/14.  11.  Acute on chronic anemia.  Continue iron supplement 12.  Leukocytosis.  (20k)  -Broad spectrum antibiotic therapy completed 11/16/2021 and monitor.  Latest blood culture showed no growth             -appearance of drainage idiscussed with ortho and is to be expected. Recent CT (10/30) demonstrating changes c/w tumoral calcinosis and mild cellulitis.              -MRI hip obtianed and stable              -ischemic toes LLE could also be a source             -continue  wound care as above             -pt feels well otherwise and no other signs of infection             -most recent wbc 14k,  recheck ordered for Monday  13.  Asthma without exacerbation as well as remote history of tobacco abuse.  Continue inhalers as directed check oxygen saturations every shift 14. Diet controlled Diabetes mellitus. Blood sugar checks discontinued 15.  Hypertension.  Toprol-XL 25 mg daily.  Monitor with increased mobility -11/24 well controlled, monitor    12/12/2021    5:08 AM 12/12/2021    2:58 AM 12/11/2021    8:35 PM  Vitals with  BMI  Weight 126 lbs 2 oz    BMI 99.24    Systolic  268 341  Diastolic  68 47  Pulse  962 96    16.  Abrupt onset bilateral sensorineural hearing loss.  Confirmed by audiology 11/7 the symptoms have resolved.  Follow-up outpatient. 17.  Diastolic and systolic congestive heart failure.  Monitor for any signs of fluid overload. Daily weights ordered Filed Weights   12/11/21 0555 12/11/21 1915 12/12/21 0508  Weight: 60 kg 59 kg 57.2 kg  Weight overall stable/decreased  18. Pruritus: sarna lotion ordered.  19. Diarrhea: continue imodium  -11/23 Reports BM 2 days ago, order miralax prN in case constipation develops  -11/24 continue miralax prn-she would like to take medication after dialysis completed. She says she will ask for it later. Will add lactulose PRN for more severe constipation            - 11/25: Last recorded BM 11/17; took PRN miralax last night without results; per patient last BM 11/20. Ordered to give PRN lactulose tonight. +passing flatus  20. Insomnia: secondary to diarrhea, continue imodium.  21. Dysuria: UA/UC ordered -11/25 will stop diflucan   22. Chronic nausea  -Continue Zofran PRN  -Suspect GERD may be contributing, increase PPI to BID  -11/23 a little improved Monitor response to increased PPI frequency 23. GERD  -increase pantoprazole to BID 24. Necrotic left 2nd and 3rd digits of left foot with decreased pulses: consulted vascular and patient will likely need digit amputation, discussed with patient. Dilaudid started for severe pain 25. Tachycardia: likely secondary to pain, continue management as above 26. Opioid induced constipation: Sorbitol ordered  -large bm 12/2  LOS: 19 days A FACE TO [place] 12/12/2021, 9:06 AM

## 2021-12-12 NOTE — Progress Notes (Signed)
Occupational Therapy Session Note  Patient Details  Name: Susan Horton MRN: [id number] Date of Birth: 1970/04/14  Today's Date: 12/12/2021 OT Individual Time: 0945-1000 OT Individual Time Calculation (min): 15 min    Short Term Goals: Week 2:  OT Short Term Goal 1 (Week 2): Pt will complete shower transfer with CGA using LRAD OT Short Term Goal 1 - Progress (Week 2): Met OT Short Term Goal 2 (Week 2): Pt will complete ambulatory toilet transfer with CGA using LRAD OT Short Term Goal 2 - Progress (Week 2): Met OT Short Term Goal 3 (Week 2): Pt will tolerate greater than 5 mins in stance using LRAD during table top activity in prep for ADL needs OT Short Term Goal 3 - Progress (Week 2): Met  Skilled Therapeutic Interventions/Progress Updates:    Upon OT arrival, pt semi recumbent in bed. Pt initially agreeable to shower/wash up but with attempts to progress towards EOB, pt limited significantly by pain at 10/10. Pt then reports "I got washed up earlier after a bowel movement. I'm sorry I just don't really think I can do this right now with this pain".Encouragement provided and pt continues to decline. Pt missed 60 minutes of OT treatment. Will make up minutes as able.   Therapy Documentation Precautions:  Precautions Precautions: Fall Precaution Comments: L foot drop, bilateral intermittent hearing loss Restrictions Weight Bearing Restrictions: No General: General OT Amount of Missed Time: 60 Minutes PT Missed Treatment Reason: Patient ill (Comment);Patient fatigue;Pain   Therapy/Group: Individual Therapy  Marvetta Gibbons 12/12/2021, 11:46 AM

## 2021-12-12 NOTE — Progress Notes (Signed)
[facility] Progress Note   Subjective:    Seen and examined patient at bedside. She reported having nausea during yesterday's treatment. Didn't see PRN Zofran was given. Currently denies SOB, CP, and N/V. She was seen by VVS yesterday: plan for angiography on 12/5. Appears she is going to require amputation of left toe.  Objective Vitals:   12/11/21 2035 12/12/21 0258 12/12/21 0508 12/12/21 1302  BP: (!) 121/47 124/68  (!) 116/57  Pulse: 96 (!) 102  96  Resp: '19 18  16  '$ Temp: 98.2 F (36.8 C) 97.9 F (36.6 C)  98.6 F (37 C)  TempSrc: Oral Oral  Oral  SpO2: 99% 99%  98%  Weight:   57.2 kg   Height:       Physical Exam General:ill appearing female in NAD Heart:RRR Lungs:CTAB Abdomen:soft, NTND Extremities:no LE edema; L hip dressing CDI Dialysis Access: LU AVF +b/t  Filed Weights   12/11/21 0555 12/11/21 1915 12/12/21 0508  Weight: 60 kg 59 kg 57.2 kg    Intake/Output Summary (Last 24 hours) at 12/12/2021 1330 Last data filed at 12/12/2021 0800 Gross per 24 hour  Intake 120 ml  Output 1 ml  Net 119 ml    Additional Objective Labs: Basic Metabolic Panel: Recent Labs  Lab 12/09/21 1253  NA 135  K 4.4  CL 88*  CO2 27  GLUCOSE 80  BUN 43*  CREATININE 5.05*  CALCIUM 8.0*  PHOS 6.7*   Liver Function Tests: Recent Labs  Lab 12/09/21 1253  ALBUMIN 2.0*   No results for input(s): "LIPASE", "AMYLASE" in the last 168 hours. CBC: Recent Labs  Lab 12/07/21 0552 12/09/21 1253  WBC 17.3* 14.4*  NEUTROABS 14.7* 12.6*  HGB 7.0* 8.5*  HCT 23.7* 28.7*  MCV 79.3* 78.8*  PLT 365 450*   Blood Culture    Component Value Date/Time   SDES WOUND HIP 11/12/2021 1425   SPECREQUEST  LEFT 11/12/2021 1425   CULT  11/12/2021 1425    NO GROWTH 2 DAYS Performed at [facility], [place] [address], [place], [place] [address]    REPTSTATUS 11/14/2021 FINAL 11/12/2021 1425    Cardiac Enzymes: No results for input(s): "CKTOTAL", "CKMB",  "CKMBINDEX", "TROPONINI" in the last 168 hours. CBG: No results for input(s): "GLUCAP" in the last 168 hours. Iron Studies: No results for input(s): "IRON", "TIBC", "TRANSFERRIN", "FERRITIN" in the last 72 hours. Lab Results  Component Value Date   INR 1.3 (H) 11/09/2021   INR 1.2 11/08/2021   INR 1.1 01/09/2019   Studies/Results: No results found.  Medications:   (feeding supplement) PROSource Plus  30 mL Oral BID BM   aspirin  81 mg Oral Daily   atorvastatin  40 mg Oral Daily   Chlorhexidine Gluconate Cloth  6 each Topical Q0600   clopidogrel  75 mg Oral Q breakfast   darbepoetin (ARANESP) injection - NON-DIALYSIS  200 mcg Subcutaneous Q Wed-1800   doxercalciferol  7 mcg Intravenous Q M,W,F-HD   DULoxetine  40 mg Oral Daily   heparin  5,000 Units Subcutaneous Q8H   lanthanum  1,000 mg Oral Q supper   leptospermum manuka honey  1 Application Topical Daily   melatonin  5 mg Oral QHS   metoCLOPramide  10 mg Oral TID AC   metoprolol succinate  12.5 mg Oral Daily   montelukast  10 mg Oral QHS   pantoprazole  40 mg Oral BID   pneumococcal 20-valent conjugate vaccine  0.5 mL Intramuscular Tomorrow-1000  thiamine  100 mg Oral Daily    Dialysis Orders: MWF South 3h 33mn 400/600  61kg  2/2 bath P2  Hep 4000  LUE AVF - mircera 200 mcg IV q2, last 10/16, due 10/30 - venofer '100mg'$  IV tiw thru 11/06 - doxercalciferol 7 ug tiw - sod thiosulfate 25 gm iv tiw  Assessment/Plan: Septic shock - sp course of Zosyn, now resolved -> CIR d/t debility  ESRD: Required CRRT on admit, now back to iHD MWF. Continue Tight heparin. HTN/volume: BP controlled, mostly euvolemic on exam.  Anemia of ESRD: Hgb^8.5. Continue max dose Aranesp 2060m weekly while here. No IV Fe with ferritin >5000. Transfuse prn for Hgb<7 Secondary HPTH: CorrCa ok, Phos high. She has trouble tolerating any binder secondary to gastroparesis, trying Fosrenol once daily for now. Nutrition: Alb low, continue supplements  sometimes refusing Nepro. Renal diet w/fluid restrictions.  Encephalopathy - D/t shock/meds? better. Had some hearing loss and foot drop 11/7 now spontaneously improving. No offending meds Myoclonus: severe, after initial consult. Felt due to meds (cefepime, gabapentin and narcotics). Resolved.  Tumoral calcinosis L hip - long-standing painful issue for her, on NaThio empirically as op, although hasn't helped. [facility]. Recultured 11/2 - NGTD. F/u with ortho this week.  T2DM with severe gastroparesis - Ongoing issue, s/p esophageal myotomy 09/17/21. Has issues swallow/keeping pills down. L foot pain - Xray negative, uric acid normal. VVS consulted, s/p LE angiography with improvement in pain. Venous [place]/no evidence of DVT.  Now worse again. MRI yesterday with diffuse edema through musculature - likely denervation versus myositis. VVS consulting, gangrenous changes of toes: per VVS note from yesterday, plan for angiography on 12/5 to confirm blood flow is optimized, she will require a left toe amputation.  Leukocytosis - persistent. Per PMD. Dispo: Tentative d/c plan of 12/19/21  CoTobie PoetNP [facility] 12/12/2021,1:30 PM  LOS: 19 days

## 2021-12-13 MED ORDER — ALTEPLASE 2 MG IJ SOLR: 2.0000 mg | Freq: Once | INTRAMUSCULAR | Status: DC | PRN

## 2021-12-13 MED ORDER — HEPARIN SODIUM (PORCINE) 1000 UNIT/ML DIALYSIS: 1000.0000 [IU] | INTRAMUSCULAR | Status: DC | PRN

## 2021-12-13 MED ORDER — LIDOCAINE HCL (PF) 1 % IJ SOLN: 5.0000 mL | INTRAMUSCULAR | Status: DC | PRN

## 2021-12-13 MED ORDER — LIDOCAINE-PRILOCAINE 2.5-2.5 % EX CREA: 1.0000 | TOPICAL_CREAM | CUTANEOUS | Status: DC | PRN

## 2021-12-13 MED ORDER — PENTAFLUOROPROP-TETRAFLUOROETH EX AERO: 1.0000 | INHALATION_SPRAY | CUTANEOUS | Status: DC | PRN

## 2021-12-13 NOTE — Progress Notes (Signed)
Patient seen and examined this afternoon.  Continues to complain of bilateral lower extremity pain. No changes in wounds Plan for left lower extremity angiogram Tuesday with Dr. Trula Slade.  Broadus John MD

## 2021-12-13 NOTE — Progress Notes (Addendum)
[facility] Progress Note   Subjective:    Seen and examined patient at bedside. No acute complaints/issues. Next HD 12/4. Per VVS, plan for angiography on 12/5. Appears she is going to require amputation of left toe.  Objective Vitals:   12/12/21 1302 12/12/21 1918 12/13/21 0417 12/13/21 0458  BP: (!) 116/57 (!) 109/49 113/66   Pulse: 96 98 95   Resp: '16 18 18   '$ Temp: 98.6 F (37 C) 98.5 F (36.9 C) 98.6 F (37 C)   TempSrc: Oral Oral Oral   SpO2: 98% 100% 99%   Weight:    57.5 kg  Height:       Physical Exam General:ill appearing female in NAD Heart:RRR Lungs:CTAB Abdomen:soft, NTND Extremities:no LE edema; L hip dressing CDI Dialysis Access: LU AVF +b/t  Filed Weights   12/11/21 1915 12/12/21 0508 12/13/21 0458  Weight: 59 kg 57.2 kg 57.5 kg    Intake/Output Summary (Last 24 hours) at 12/13/2021 1133 Last data filed at 12/13/2021 0747 Gross per 24 hour  Intake 240 ml  Output --  Net 240 ml    Additional Objective Labs: Basic Metabolic Panel: Recent Labs  Lab 12/09/21 1253  NA 135  K 4.4  CL 88*  CO2 27  GLUCOSE 80  BUN 43*  CREATININE 5.05*  CALCIUM 8.0*  PHOS 6.7*   Liver Function Tests: Recent Labs  Lab 12/09/21 1253  ALBUMIN 2.0*   No results for input(s): "LIPASE", "AMYLASE" in the last 168 hours. CBC: Recent Labs  Lab 12/07/21 0552 12/09/21 1253  WBC 17.3* 14.4*  NEUTROABS 14.7* 12.6*  HGB 7.0* 8.5*  HCT 23.7* 28.7*  MCV 79.3* 78.8*  PLT 365 450*   Blood Culture    Component Value Date/Time   SDES WOUND HIP 11/12/2021 1425   SPECREQUEST  LEFT 11/12/2021 1425   CULT  11/12/2021 1425    NO GROWTH 2 DAYS Performed at [facility], [place] [address] [place], [place] [address]    REPTSTATUS 11/14/2021 FINAL 11/12/2021 1425    Cardiac Enzymes: No results for input(s): "CKTOTAL", "CKMB", "CKMBINDEX", "TROPONINI" in the last 168 hours. CBG: No results for input(s): "GLUCAP" in the last 168 hours. Iron  Studies: No results for input(s): "IRON", "TIBC", "TRANSFERRIN", "FERRITIN" in the last 72 hours. Lab Results  Component Value Date   INR 1.3 (H) 11/09/2021   INR 1.2 11/08/2021   INR 1.1 01/09/2019   Studies/Results: No results found.  Medications:   (feeding supplement) PROSource Plus  30 mL Oral BID BM   aspirin  81 mg Oral Daily   atorvastatin  40 mg Oral Daily   Chlorhexidine Gluconate Cloth  6 each Topical Q0600   clopidogrel  75 mg Oral Q breakfast   darbepoetin (ARANESP) injection - NON-DIALYSIS  200 mcg Subcutaneous Q Wed-1800   doxercalciferol  7 mcg Intravenous Q M,W,F-HD   DULoxetine  40 mg Oral Daily   heparin  5,000 Units Subcutaneous Q8H   lanthanum  1,000 mg Oral Q supper   leptospermum manuka honey  1 Application Topical Daily   melatonin  5 mg Oral QHS   metoCLOPramide  10 mg Oral TID AC   metoprolol succinate  12.5 mg Oral Daily   montelukast  10 mg Oral QHS   pantoprazole  40 mg Oral BID   pneumococcal 20-valent conjugate vaccine  0.5 mL Intramuscular Tomorrow-1000   thiamine  100 mg Oral Daily    Dialysis Orders: MWF South 3h 71mn 400/600  61kg  2/2 bath P2  Hep 4000  LUE AVF - mircera 200 mcg IV q2, last 10/16, due 10/30 - venofer '100mg'$  IV tiw thru 11/06 - doxercalciferol 7 ug tiw - sod thiosulfate 25 gm iv tiw  Assessment/Plan: Septic shock - sp course of Zosyn, now resolved -> CIR d/t debility  ESRD: Required CRRT on admit, now back to iHD MWF. Continue Tight heparin. HTN/volume: BP controlled, euvolemic on exam.  Anemia of ESRD: Hgb^8.5. Continue max dose Aranesp 241mg weekly while here. No IV Fe with ferritin >5000. Transfuse prn for Hgb<7 Secondary HPTH: CorrCa ok, Phos high. She has trouble tolerating any binder secondary to gastroparesis, trying Fosrenol once daily for now.  Nutrition: Alb low, continue supplements sometimes refusing Nepro. Renal diet w/fluid restrictions.  Encephalopathy - D/t shock/meds? better. Had some hearing loss and  foot drop 11/7 now spontaneously improving. No offending meds Myoclonus: severe, after initial consult. Felt due to meds (cefepime, gabapentin and narcotics). Resolved.  Tumoral calcinosis L hip - long-standing painful issue for her, on NaThio empirically as op, although hasn't helped. [facility] Recultured 11/2 - NGTD. F/u with ortho this week.  T2DM with severe gastroparesis - Ongoing issue, s/p esophageal myotomy 09/17/21. Has issues swallow/keeping pills down. L foot pain - Xray negative, uric acid normal. VVS consulted, s/p LE angiography with improvement in pain. Venous [place]/no evidence of DVT.  Now worse again. MRI yesterday with diffuse edema through musculature - likely denervation versus myositis. VVS consulting, gangrenous changes of toes: per VVS note from yesterday, plan for angiography on 12/5 to confirm blood flow is optimized, she will require a left toe amputation.  Leukocytosis - persistent. Per PMD. Dispo: Tentative d/c plan of 12/19/21  CTobie Poet NP [facility] 12/13/2021,11:33 AM  LOS: 20 days

## 2021-12-14 LAB — CBC WITH DIFFERENTIAL/PLATELET
Abs Immature Granulocytes: 0.17 10*3/uL — ABNORMAL HIGH (ref 0.00–0.07)
Basophils Absolute: 0.1 10*3/uL (ref 0.0–0.1)
Basophils Relative: 0 %
Eosinophils Absolute: 0 10*3/uL (ref 0.0–0.5)
Eosinophils Relative: 0 %
HCT: 25 % — ABNORMAL LOW (ref 36.0–46.0)
Hemoglobin: 7.2 g/dL — ABNORMAL LOW (ref 12.0–15.0)
Immature Granulocytes: 1 %
Lymphocytes Relative: 7 %
Lymphs Abs: 1.3 10*3/uL (ref 0.7–4.0)
MCH: 22.5 pg — ABNORMAL LOW (ref 26.0–34.0)
MCHC: 28.8 g/dL — ABNORMAL LOW (ref 30.0–36.0)
MCV: 78.1 fL — ABNORMAL LOW (ref 80.0–100.0)
Monocytes Absolute: 1 10*3/uL (ref 0.1–1.0)
Monocytes Relative: 6 %
Neutro Abs: 14.9 10*3/uL — ABNORMAL HIGH (ref 1.7–7.7)
Neutrophils Relative %: 86 %
Platelets: 463 10*3/uL — ABNORMAL HIGH (ref 150–400)
RBC: 3.2 MIL/uL — ABNORMAL LOW (ref 3.87–5.11)
RDW: 18.9 % — ABNORMAL HIGH (ref 11.5–15.5)
WBC: 17.4 10*3/uL — ABNORMAL HIGH (ref 4.0–10.5)
nRBC: 0 % (ref 0.0–0.2)

## 2021-12-14 LAB — RENAL FUNCTION PANEL
Albumin: <1.5 g/dL — ABNORMAL LOW (ref 3.5–5.0)
Anion gap: 15 (ref 5–15)
BUN: 44 mg/dL — ABNORMAL HIGH (ref 6–20)
CO2: 23 mmol/L (ref 22–32)
Calcium: 7.1 mg/dL — ABNORMAL LOW (ref 8.9–10.3)
Chloride: 95 mmol/L — ABNORMAL LOW (ref 98–111)
Creatinine, Ser: 5.96 mg/dL — ABNORMAL HIGH (ref 0.44–1.00)
GFR, Estimated: 8 mL/min — ABNORMAL LOW (ref >60)
Glucose, Bld: 77 mg/dL (ref 70–99)
Phosphorus: 6.6 mg/dL — ABNORMAL HIGH (ref 2.5–4.6)
Potassium: 4 mmol/L (ref 3.5–5.1)
Sodium: 133 mmol/L — ABNORMAL LOW (ref 135–145)

## 2021-12-14 MED ORDER — HEPARIN SODIUM (PORCINE) 5000 UNIT/ML IJ SOLN: 5000.0000 [IU] | Freq: Three times a day (TID) | INTRAMUSCULAR | Status: DC

## 2021-12-14 MED ORDER — LIDOCAINE-PRILOCAINE 2.5-2.5 % EX CREA: 1.0000 | TOPICAL_CREAM | CUTANEOUS | Status: DC | PRN

## 2021-12-14 MED ORDER — HEPARIN SODIUM (PORCINE) 1000 UNIT/ML DIALYSIS
4000.0000 [IU] | INTRAMUSCULAR | Status: DC | PRN
  Administered 2021-12-14: 4000 [IU] via INTRAVENOUS_CENTRAL
  Filled 2021-12-14 (×2): qty 4

## 2021-12-14 MED ORDER — PENTAFLUOROPROP-TETRAFLUOROETH EX AERO: 1.0000 | INHALATION_SPRAY | CUTANEOUS | Status: DC | PRN

## 2021-12-14 MED ORDER — LIDOCAINE HCL (PF) 1 % IJ SOLN: 5.0000 mL | INTRAMUSCULAR | Status: DC | PRN

## 2021-12-14 NOTE — Progress Notes (Signed)
Occupational Therapy Note  Patient Details  Name: Susan Horton MRN: [id number] Date of Birth: 1970-08-16   Pt's plan of care adjusted to 15/7 after speaking with care team and discussed with MD in team conference as pt currently unable to tolerate current therapy schedule with OT, PT.    [place], MS, OTR/L  12/14/2021, 8:18 AM

## 2021-12-14 NOTE — Progress Notes (Signed)
Physical Therapy Session Note  Patient Details  Name: Susan Horton MRN: [id number] Date of Birth: February 08, 1970  Today's Date: 12/14/2021 PT Individual Time: 0731-0824 and 1100-1120 PT Individual Time Calculation (min): 53 min  and 20 min Today's Date: 12/14/2021 PT Missed Time: 7 Minutes and 40 minutes Missed Time Reason: Patient fatigue;Pain  Short Term Goals: Week 2:  PT Short Term Goal 1 (Week 2): pt will transfer sit<>stand with LRAD and consistant SUP PT Short Term Goal 1 - Progress (Week 2): Progressing toward goal PT Short Term Goal 2 (Week 2): pt will transfer bed<>chair with LRAD and consistant SUP PT Short Term Goal 2 - Progress (Week 2): Progressing toward goal PT Short Term Goal 3 (Week 2): pt will ambulate 34f with LRAD and SUP PT Short Term Goal 3 - Progress (Week 2): Progressing toward goal PT Short Term Goal 4 (Week 2): Pt will initiate stair training. PT Short Term Goal 4 - Progress (Week 2): Met Week 3:  PT Short Term Goal 1 (Week 3): STG=LTG due to LOS  Skilled Therapeutic Interventions/Progress Updates:   Treatment Session 1 Received pt semi-reclined in bed and reporting pain 10/10 in bilateral feet and legs. Pt reported stronger pain medicine makes her sleepy and Tylenol doesn't help - notified RN and MD. Pt lethargic, delayed in responses, and extremely slow to move this morning. Pt reported feeling like she was not clean from last BM and requested therapist check. Rolled onto L side with supervision and performed peri-care dependently. Pt stated her bottom felt "raw" - explained most likely due from lying in bed for so long. Encouraged OOB mobility, however pt politely refused due to pain; therefore placed pillow underneath buttocks, in between knees, and in between feet for pressure relief.   Discussed equipment at home and pt reports having scooter. Stepped out to ask RN about dilaudid and upon returning, pt had forgotten that she was looking for picture of scooter to  show therapist. Pt spent increased time attempting to locate picture of scooter on her phone but unable to find one. Discussed need for longer slideboard for transfers on/off scooter and in/out of car. Replaced current slideboard with 30in board for pt to practice with when feeling up to it. Discussed with treatment team making pt 15/7 due to inability to tolerate current therapy schedule. Concluded session with pt L sidelying in bed, needs within reach, and bed alarm on. 7 minutes missed of skilled physical therapy due to pain/fatigue.   Treatment Session 2 Received pt semi-reclined in bed, reporting continued pain rated 10/10 in bilateral feet/legs. Pt agreeable to taking dilaudid - RN notified and present to administer medication. Pt refused OOB mobility or bed level exercises due to pain. Pt did not eat much for breakfast and agreed to eat saltine crackers, but when therapist brought them to pt she stated "not right now" - pt to be NPO at midnight for angiogram tomorrow. Rolled R with supervision and increased time and placed additional pillow under L buttocks and in between knees for pressure relief. Concluded session with pt semi-reclined in bed with eyes closed, needs within reach, and bed alarm on. 40 minutes missed of skilled physical therapy due to pain/fatigue.   Therapy Documentation Precautions:  Precautions Precautions: Fall Precaution Comments: L foot drop, bilateral intermittent hearing loss Restrictions Weight Bearing Restrictions: No  Therapy/Group: Individual Therapy AAlfonse AlpersPT, DPT  12/14/2021, 6:52 AM

## 2021-12-14 NOTE — Progress Notes (Signed)
Occupational Therapy Session Note  Patient Details  Name: Susan Horton MRN: [id number] Date of Birth: 12-16-1970  Today's Date: 12/14/2021 OT Individual Time: 6269-4854 OT Individual Time Calculation (min): 27 min    Short Term Goals: Week 3:  OT Short Term Goal 1 (Week 3): STG = LTG due to ELOS  Skilled Therapeutic Interventions/Progress Updates:  Skilled OT intervention completed with focus on pain and skin integrity management. Pt received in L side-lying, agreeable to session. Unrated pain reported in bilateral feet, with poor arousal, increased lethargy, confusion and minimal command following this session. Therapist checked in with nursing and MD also aware.  Vitals as follows: BP supine- 109/53; automatic Pulse- 95 bpm SPO2- 98%  Per PT, pt with c/o severe pain on buttocks during her session, with known pressure sore already present. With pt's approval and nursing report of previous BM, therapist removed sacral dressing to observe skin integrity and potential progression of pressure injury. Noted inability to roll > L side for dressing change due to BLE pain. Worsening sacral pressure sore with grade 2 sore or greater on sacrum, with new developing skin breakdown on surrounding buttocks region. MD notified of potential need for air bed to promote proper pressure relief. While in L side-lying therapist applied honey med on the sacral wound and applied new dressing. Repositioned legs with pillows for comfort.  Encouraged pt to continue drinking fluids, due to notably dry mouth, with pt able to drink several sips of water with set up A, and then therapist offered lip moisturizer as well with pt able to apply with set up A.   Pt remained in L side-lying, with bed alarm on/activated, and with all needs in reach at end of session.   Therapy Documentation Precautions:  Precautions Precautions: Fall Precaution Comments: L foot drop, bilateral intermittent hearing  loss Restrictions Weight Bearing Restrictions: No    Therapy/Group: Individual Therapy  Blase Mess, MS, OTR/L  12/14/2021, 10:25 AM

## 2021-12-14 NOTE — Progress Notes (Signed)
[facility] Progress Note   Subjective:  Seen in room - says she had a rough night, looks like was vomiting. Denies CP/dyspnea. For HD later today. Per notes, will be going for LLE angiogram 12/5.  Objective Vitals:   12/13/21 1330 12/13/21 1932 12/14/21 0330 12/14/21 0520  BP: (!) 108/50 (!) 106/49 (!) 104/51   Pulse: (!) 101 (!) 102 96   Resp: '17 18 18   '$ Temp: 98.7 F (37.1 C) 98.2 F (36.8 C) 98.8 F (37.1 C)   TempSrc: Oral Oral Oral   SpO2: 98% 95% 99%   Weight:    57.4 kg  Height:       Physical Exam General: Chronically ill appearing woman, NAD. Heart: RRR; no murmur Lungs: CTAB Abdomen: soft Extremities: No LE edema; L foot and hip bandaged Dialysis Access: LUE AVF + bruit  Additional Objective Labs: Basic Metabolic Panel: Recent Labs  Lab 12/09/21 1253  NA 135  K 4.4  CL 88*  CO2 27  GLUCOSE 80  BUN 43*  CREATININE 5.05*  CALCIUM 8.0*  PHOS 6.7*   Liver Function Tests: Recent Labs  Lab 12/09/21 1253  ALBUMIN 2.0*   CBC: Recent Labs  Lab 12/09/21 1253  WBC 14.4*  NEUTROABS 12.6*  HGB 8.5*  HCT 28.7*  MCV 78.8*  PLT 450*   Medications:   (feeding supplement) PROSource Plus  30 mL Oral BID BM   aspirin  81 mg Oral Daily   atorvastatin  40 mg Oral Daily   Chlorhexidine Gluconate Cloth  6 each Topical Q0600   clopidogrel  75 mg Oral Q breakfast   darbepoetin (ARANESP) injection - NON-DIALYSIS  200 mcg Subcutaneous Q Wed-1800   doxercalciferol  7 mcg Intravenous Q M,W,F-HD   DULoxetine  40 mg Oral Daily   heparin  5,000 Units Subcutaneous Q8H   lanthanum  1,000 mg Oral Q supper   leptospermum manuka honey  1 Application Topical Daily   melatonin  5 mg Oral QHS   metoCLOPramide  10 mg Oral TID AC   metoprolol succinate  12.5 mg Oral Daily   montelukast  10 mg Oral QHS   pantoprazole  40 mg Oral BID   thiamine  100 mg Oral Daily    Dialysis Orders: MWF South 3h 30mn 400/600  61kg  2/2 bath P2  Hep 4000  LUE AVF -  mircera 200 mcg IV q2, last 10/16, due 10/30 - venofer '100mg'$  IV tiw thru 11/06 - doxercalciferol 7 ug tiw - sod thiosulfate 25 gm iv tiw   Assessment/Plan: Septic shock - s/p course of Zosyn, now resolved -> CIR d/t debility  ESRD: Required CRRT on admit, now back to iHD MWF. Continue heparin with HD. HD today. HTN/volume: BP controlled, euvolemic on exam.  Anemia of ESRD: Hgb 8.5. Continue max dose Aranesp 2072m weekly while here. No IV Fe with ferritin > 5000. Transfuse prn for Hgb < 7. Secondary HPTH: CorrCa ok, Phos high. She has trouble tolerating any binder secondary to gastroparesis, trying Fosrenol once daily for now.  Nutrition: Alb low, continue supplements sometimes refusing Nepro. Renal diet w/fluid restrictions. Tumoral calcinosis L hip - long-standing painful issue for her, on NaThio empirically as op, although hasn't helped. [facility] Recultured 11/2 - NGTD. F/u with ortho this week.  T2DM with severe gastroparesis - Ongoing issue, s/p esophageal myotomy 09/17/21. Has issues swallow/keeping pills down. L foot pain - Xray negative, uric acid normal. VVS consulted, s/p  LE angiography with improvement in pain. Venous US w/no evidence of DVT.  Now worse again. MRI yesterday with diffuse edema through musculature - likely denervation versus myositis. VVS re-consulted due to gangrenous changes of toes: Plan for angiography on 12/5 to confirm blood flow is optimized, she will require a left toe amputation.  Leukocytosis - persistent. Per PMD. Myoclonus (resolved): severe, after initial consult. Felt due to meds (cefepime, gabapentin and narcotics). Dispo: Tentative d/c plan of 12/19/21    Veneta Penton, PA-C 12/14/2021, 10:52 AM  [facility]

## 2021-12-14 NOTE — Progress Notes (Addendum)
Received patient in bed to unit.  Alert and oriented.  Informed consent signed and in chart.   Patient's blood pressure dropped to 71/60. UF paused and blood pressure went up to101/42. Patient then signed a waiver to terminate HD. Dr. Joylene Grapes notified.  Transported back to the room  Alert, without acute distress.  Hand-off given to patient's nurse.   Access used: LUA AVF Access issues: none  Total UF removed: 600 ml Medication(s) given: Hectorol Post HD VS: BP: 98/73, pulse: 92, RR: 17, O2sat: 98 Post HD weight: 57.1 kg   [facility]

## 2021-12-14 NOTE — Progress Notes (Signed)
Occupational Therapy Session Note  Patient Details  Name: Susan Horton MRN: [id number] Date of Birth: 14-Oct-1970  Today's Date: 12/14/2021 OT Missed Time: 70 Minutes Missed Time Reason: Patient fatigue;Patient unwilling/refused to participate;Pain   Short Term Goals: Week 3:  OT Short Term Goal 1 (Week 3): STG = LTG due to ELOS  Skilled Therapeutic Interventions/Progress Updates:  Pt received supine in bed with increased fatigue/lethargy, declining all therapeutic activities at this time.  Pt missing 60 minutes of skilled OT intervention, to be made up as appropriate.  Therapy Documentation Precautions:  Precautions Precautions: Fall Precaution Comments: L foot drop, bilateral intermittent hearing loss Restrictions Weight Bearing Restrictions: No    Therapy/Group: Individual Therapy  Maudie Mercury, OTR/L, MSOT  12/14/2021, 7:51 AM

## 2021-12-14 NOTE — Progress Notes (Signed)
Patient seen and examined this morning. Still reporting stable and unchanged bilateral lower extremity pain. No changes in necrotic toes.   Plan is for left lower extremity angiogram on 12/5 with Dr.Brabham. Informed consent orders and NPO in the patient's chart. Patient is agreeable to this plan.  Vicente Serene, PA-C [facility] [phone]

## 2021-12-14 NOTE — Progress Notes (Deleted)
Physical Therapy Note  Patient Details  Name: Susan Horton MRN: [id number] Date of Birth: [birth year] Today's Date: 12/14/2021    Pt's plan of care adjusted to 15/7 after speaking with care team and discussed with MD in team conference as pt currently unable to tolerate current therapy schedule with OT and PT.    Betsey Holiday Lyn Hollingshead PT, DPT  12/14/2021, 8:19 AM

## 2021-12-14 NOTE — Progress Notes (Signed)
PROGRESS NOTE   Subjective/Complaints: Patient appears more lethargic than usual today. Medications reviewed and it has been two days since she received Dilaudid and Oxycodone  ROS: Patient denies fever, rash, sore throat, blurred vision, dizziness, nausea, vomiting, diarrhea, cough, shortness of breath or chest pain, headache, or mood change.   Objective:   No results found. No results for input(s): "WBC", "HGB", "HCT", "PLT" in the last 72 hours.   No results for input(s): "NA", "K", "CL", "CO2", "GLUCOSE", "BUN", "CREATININE", "CALCIUM" in the last 72 hours.    Intake/Output Summary (Last 24 hours) at 12/14/2021 1054 Last data filed at 12/14/2021 0715 Gross per 24 hour  Intake 340 ml  Output --  Net 340 ml         Physical Exam: Vital Signs Blood pressure (!) 104/51, pulse 96, temperature 98.8 F (37.1 C), temperature source Oral, resp. rate 18, height '5\' 5"'$  (1.651 m), weight 57.4 kg, SpO2 99 %.  Constitutional: No distress . Vital signs reviewed. Lethargic and fatigued HEENT: NCAT, EOMI, oral membranes moist Neck: supple Cardiovascular: RRR without murmur. No JVD    Respiratory/Chest: CTA Bilaterally without wheezes or rales. Normal effort    GI/Abdomen: BS +, non-tender, non-distended Ext: no clubbing, cyanosis, or edema. LUE AVG Psych:flat but cooperative  Musculoskeletal:     Cervical back: Normal range of motion.     Comments: Left hip tender with ROM, appears swollen. Large dressing in place. Left foot hypersensitive to touch.  Skin:    Comments:  Left hip Xeroform dressing in place.  clumpy white drainage.  Left foot: tip of great toe and 2nd/third purple, necrotic.  AVF in LUE  Stage 2 to sacrum Neurological:     Mental Status: She is alert.     Comments:    Alert and oriented x 3. Follows commands, Normal insight and awareness. Intact Memory. Normal language and speech. CN 2-12 grossly intact. UE  grossly 5/5. RLE 3+ to 4/5 prox to 4+/5 distally. LLE 2/5 prox to trace/5 ankle. Stocking glove sensory loss bilateral LE, L>R up to mid calf.         Assessment/Plan: 1. Functional deficits which require 3+ hours per day of interdisciplinary therapy in a comprehensive inpatient rehab setting. Physiatrist is providing close team supervision and 24 hour management of active medical problems listed below. Physiatrist and rehab team continue to assess barriers to discharge/monitor patient progress toward functional and medical goals  Care Tool:  Bathing    Body parts bathed by patient: Right arm, Left arm, Chest, Abdomen, Front perineal area, Right upper leg, Left upper leg, Right lower leg, Left lower leg, Face, Buttocks   Body parts bathed by helper: Buttocks     Bathing assist Assist Level: Contact Guard/Touching assist Assistive Device Comment: stand pivot, shower chair   Upper Body Dressing/Undressing Upper body dressing   What is the patient wearing?: Pull over shirt    Upper body assist Assist Level: Independent with assistive device Assistive Device Comment: Seated on WC  Lower Body Dressing/Undressing Lower body dressing      What is the patient wearing?: Pants     Lower body assist Assist for lower body dressing:  Moderate Assistance - Patient 50 - 74%     Toileting Toileting Toileting Activity did not occur Landscape architect and hygiene only): N/A (no void or bm)  Toileting assist Assist for toileting: Maximal Assistance - Patient 25 - 49%     Transfers Chair/bed transfer  Transfers assist     Chair/bed transfer assist level: Minimal Assistance - Patient > 75% (slideboard)     Locomotion Ambulation   Ambulation assist   Ambulation activity did not occur: Safety/medical concerns (pain, fatigue, weakness/deconditioning, decreased balance)  Assist level: Contact Guard/Touching assist Assistive device: Walker-rolling Max distance: 10   Walk 10  feet activity   Assist  Walk 10 feet activity did not occur: Safety/medical concerns (pain, fatigue, weakness/deconditioning, decreased balance)  Assist level: Contact Guard/Touching assist Assistive device: Walker-rolling   Walk 50 feet activity   Assist Walk 50 feet with 2 turns activity did not occur: Safety/medical concerns (pain, fatigue, weakness/deconditioning, decreased balance)         Walk 150 feet activity   Assist Walk 150 feet activity did not occur: Safety/medical concerns (pain, fatigue, weakness/deconditioning, decreased balance)         Walk 10 feet on uneven surface  activity   Assist Walk 10 feet on uneven surfaces activity did not occur: Safety/medical concerns (pain, fatigue, weakness/deconditioning, decreased balance)         Wheelchair     Assist Is the patient using a wheelchair?: Yes Type of Wheelchair: Manual    Wheelchair assist level: Supervision/Verbal cueing Max wheelchair distance: 50    Wheelchair 50 feet with 2 turns activity    Assist        Assist Level: Supervision/Verbal cueing   Wheelchair 150 feet activity     Assist      Assist Level: Supervision/Verbal cueing   Blood pressure (!) 104/51, pulse 96, temperature 98.8 F (37.1 C), temperature source Oral, resp. rate 18, height '5\' 5"'$  (1.651 m), weight 57.4 kg, SpO2 99 %.    Medical Problem List and Plan: 1. Functional deficits secondary to septic shock/metabolic with debility and encephalopathy. Pt appears cognitively to be at baseline. -  Antibiotic therapy 10/29 - 11/6             -patient may shower if left hip is covered             -ELOS/Goals: 10-12 days, mod I to supervision goals  -Continue CIR therapies including PT, OT. Reduced to 15/7  -Seen by neuropsychology  working on coping/adjustment  2.  Impaired mobility: working on sit to stands. Provided accomodation letter for ramp and bathroom. -DVT/anticoagulation:  Pharmaceutical: continue  Heparin given bleeding risk - intermittently refusing, educated patient on need 11/25. Discussed SCDs as an alternative. Continue Aspirin 81 mg daily and Plavix 75 mg daily 3. Left foot pain, Chronic periopheral polyneuropathy (likely d/t DM 2): changed Lidoderm patch to bottom of right foot, oxycodone as needed. Provided list of foods for pain relief. Manuka honey ordered for wound healing. Avoid gabapentin since causes confusion. Increase Cymbalta to '30mg'$  daily. Discussed that MRI shows edema/myositis/denervation. Dilaudid started for severe pain. Plan for angiogram 12/5 4. Insomnia: increase melatonin to '5mg'$  5. Neuropsych/cognition: This patient is capable of making decisions on her own behalf. 6. Left hip incision: Continue BID to TID dressing changes to left hip. Discussed with patient that surgeon stated continued drainage is to be expected. Apply manuka honey.  -WOC follow-up for left hip drainage. -completed course of broad spectrum abx on 11/6   -  Assistance provided by orthopedics. 7. Fluids/Electrolytes/Nutrition: Routine in and outs with follow-up chemistries 8.  Peripheral vascular disease.  Follow-up vascular surgery for occluded stents status post right common iliac left common iliac stenting with balloon angioplasty left common femoral and stent left SFA 11/16/2021 per Dr. [name]. Reviewed [name] and peroneal artery is occluded 100% 9.  Tumoral calcinosis.  Recent excision of tumoral calcinosis left hip thigh at [facility] 10/20/2021.  Weightbearing as tolerated. Communicated MRI results to ortho 10.  End-stage renal disease.  Continue hemodialysis as directed. Discussed improved Creatinine to 3.08 on 11/14. Check renal panel today 11.  Acute on chronic anemia.  Continue iron supplement 12.  Leukocytosis.  (20k)  -Broad spectrum antibiotic therapy completed 11/16/2021 and monitor.  Latest blood culture showed no growth             -appearance of drainage idiscussed with ortho and is to be  expected. Recent CT (10/30) demonstrating changes c/w tumoral calcinosis and mild cellulitis.              -MRI hip obtianed and stable              -ischemic toes LLE could also be a source             -continue wound care as above             -pt feels well otherwise and no other signs of infection             -most recent wbc 14k,  recheck ordered for Monday  13.  Asthma without exacerbation as well as remote history of tobacco abuse.  Continue inhalers as directed check oxygen saturations every shift 14. Diet controlled Diabetes mellitus. Blood sugar checks discontinued 15.  Hypertension.  Toprol-XL 25 mg daily.  Monitor with increased mobility -11/24 well controlled, monitor    12/14/2021    5:20 AM 12/14/2021    3:30 AM 12/13/2021    7:32 PM  Vitals with BMI  Weight 126 lbs 9 oz    BMI 24.23    Systolic  536 144  Diastolic  51 49  Pulse  96 102    16.  Abrupt onset bilateral sensorineural hearing loss.  Confirmed by audiology 11/7 the symptoms have resolved.  Follow-up outpatient. 17.  Diastolic and systolic congestive heart failure.  Monitor for any signs of fluid overload. Daily weights ordered Filed Weights   12/12/21 0508 12/13/21 0458 12/14/21 0520  Weight: 57.2 kg 57.5 kg 57.4 kg  Weight overall stable/decreased  18. Pruritus: sarna lotion ordered.  19. Diarrhea: continue imodium  -11/23 Reports BM 2 days ago, order miralax prN in case constipation develops  -11/24 continue miralax prn-she would like to take medication after dialysis completed. She says she will ask for it later. Will add lactulose PRN for more severe constipation            - 11/25: Last recorded BM 11/17; took PRN miralax last night without results; per patient last BM 11/20. Ordered to give PRN lactulose tonight. +passing flatus  20. Insomnia: secondary to diarrhea, continue imodium.  21. Dysuria: UA/UC ordered -11/25 will stop diflucan   22. Chronic nausea  -Continue Zofran PRN  -Suspect GERD  may be contributing, increase PPI to BID  -11/23 a little improved Monitor response to increased PPI frequency 23. GERD  -increase pantoprazole to BID 24. Necrotic left 2nd and 3rd digits of left foot with decreased pulses: consulted vascular and patient will likely  need digit amputation, discussed with patient. Dilaudid started for severe pain 25. Tachycardia: likely secondary to pain, continue management as above 26. Opioid induced constipation: Sorbitol ordered  -large bm 12/2  LOS: 21 days A FACE TO FACE EVALUATION WAS PERFORMED  Ibrahim Mcpheeters P Glorine Hanratty 12/14/2021, 10:54 AM

## 2021-12-15 ENCOUNTER — Encounter (HOSPITAL_COMMUNITY): Admission: RE | Disposition: E | Payer: Self-pay | Attending: Surgery

## 2021-12-15 ENCOUNTER — Encounter (HOSPITAL_COMMUNITY): Payer: Self-pay | Admitting: Surgery

## 2021-12-15 ENCOUNTER — Other Ambulatory Visit: Payer: Self-pay

## 2021-12-15 ENCOUNTER — Inpatient Hospital Stay (HOSPITAL_COMMUNITY)
Admission: RE | Admit: 2021-12-15 | Discharge: 2022-01-11 | DRG: 252 | Disposition: E | Payer: Medicare Other | Source: Other Acute Inpatient Hospital | Attending: Surgery | Admitting: Surgery

## 2021-12-15 DIAGNOSIS — Z880 Allergy status to penicillin: Secondary | ICD-10-CM

## 2021-12-15 DIAGNOSIS — J4540 Moderate persistent asthma, uncomplicated: Secondary | ICD-10-CM | POA: Diagnosis present

## 2021-12-15 DIAGNOSIS — J9811 Atelectasis: Secondary | ICD-10-CM | POA: Diagnosis not present

## 2021-12-15 DIAGNOSIS — Z515 Encounter for palliative care: Secondary | ICD-10-CM | POA: Diagnosis not present

## 2021-12-15 DIAGNOSIS — G9341 Metabolic encephalopathy: Secondary | ICD-10-CM | POA: Diagnosis not present

## 2021-12-15 DIAGNOSIS — A419 Sepsis, unspecified organism: Secondary | ICD-10-CM | POA: Diagnosis not present

## 2021-12-15 DIAGNOSIS — D62 Acute posthemorrhagic anemia: Secondary | ICD-10-CM | POA: Diagnosis not present

## 2021-12-15 DIAGNOSIS — G9340 Encephalopathy, unspecified: Secondary | ICD-10-CM | POA: Diagnosis present

## 2021-12-15 DIAGNOSIS — I70261 Atherosclerosis of native arteries of extremities with gangrene, right leg: Secondary | ICD-10-CM | POA: Diagnosis not present

## 2021-12-15 DIAGNOSIS — Z888 Allergy status to other drugs, medicaments and biological substances status: Secondary | ICD-10-CM

## 2021-12-15 DIAGNOSIS — R579 Shock, unspecified: Secondary | ICD-10-CM

## 2021-12-15 DIAGNOSIS — R197 Diarrhea, unspecified: Secondary | ICD-10-CM | POA: Insufficient documentation

## 2021-12-15 DIAGNOSIS — I739 Peripheral vascular disease, unspecified: Principal | ICD-10-CM | POA: Diagnosis present

## 2021-12-15 DIAGNOSIS — E872 Acidosis, unspecified: Secondary | ICD-10-CM | POA: Diagnosis not present

## 2021-12-15 DIAGNOSIS — I120 Hypertensive chronic kidney disease with stage 5 chronic kidney disease or end stage renal disease: Secondary | ICD-10-CM | POA: Diagnosis not present

## 2021-12-15 DIAGNOSIS — I2510 Atherosclerotic heart disease of native coronary artery without angina pectoris: Secondary | ICD-10-CM | POA: Diagnosis present

## 2021-12-15 DIAGNOSIS — R4182 Altered mental status, unspecified: Secondary | ICD-10-CM | POA: Diagnosis not present

## 2021-12-15 DIAGNOSIS — R0902 Hypoxemia: Secondary | ICD-10-CM | POA: Diagnosis not present

## 2021-12-15 DIAGNOSIS — E11649 Type 2 diabetes mellitus with hypoglycemia without coma: Secondary | ICD-10-CM | POA: Diagnosis present

## 2021-12-15 DIAGNOSIS — Z8616 Personal history of COVID-19: Secondary | ICD-10-CM | POA: Diagnosis not present

## 2021-12-15 DIAGNOSIS — N186 End stage renal disease: Secondary | ICD-10-CM | POA: Diagnosis present

## 2021-12-15 DIAGNOSIS — I480 Paroxysmal atrial fibrillation: Secondary | ICD-10-CM | POA: Diagnosis not present

## 2021-12-15 DIAGNOSIS — Z87891 Personal history of nicotine dependence: Secondary | ICD-10-CM

## 2021-12-15 DIAGNOSIS — G928 Other toxic encephalopathy: Secondary | ICD-10-CM | POA: Diagnosis present

## 2021-12-15 DIAGNOSIS — K7200 Acute and subacute hepatic failure without coma: Secondary | ICD-10-CM | POA: Diagnosis not present

## 2021-12-15 DIAGNOSIS — I132 Hypertensive heart and chronic kidney disease with heart failure and with stage 5 chronic kidney disease, or end stage renal disease: Secondary | ICD-10-CM | POA: Diagnosis present

## 2021-12-15 DIAGNOSIS — R6521 Severe sepsis with septic shock: Secondary | ICD-10-CM | POA: Diagnosis not present

## 2021-12-15 DIAGNOSIS — L03116 Cellulitis of left lower limb: Secondary | ICD-10-CM | POA: Diagnosis present

## 2021-12-15 DIAGNOSIS — I469 Cardiac arrest, cause unspecified: Secondary | ICD-10-CM | POA: Diagnosis not present

## 2021-12-15 DIAGNOSIS — L89152 Pressure ulcer of sacral region, stage 2: Secondary | ICD-10-CM | POA: Diagnosis present

## 2021-12-15 DIAGNOSIS — Z7982 Long term (current) use of aspirin: Secondary | ICD-10-CM

## 2021-12-15 DIAGNOSIS — A4101 Sepsis due to Methicillin susceptible Staphylococcus aureus: Secondary | ICD-10-CM | POA: Diagnosis not present

## 2021-12-15 DIAGNOSIS — K219 Gastro-esophageal reflux disease without esophagitis: Secondary | ICD-10-CM | POA: Diagnosis present

## 2021-12-15 DIAGNOSIS — E1122 Type 2 diabetes mellitus with diabetic chronic kidney disease: Secondary | ICD-10-CM | POA: Diagnosis present

## 2021-12-15 DIAGNOSIS — Z7902 Long term (current) use of antithrombotics/antiplatelets: Secondary | ICD-10-CM

## 2021-12-15 DIAGNOSIS — E1152 Type 2 diabetes mellitus with diabetic peripheral angiopathy with gangrene: Principal | ICD-10-CM | POA: Diagnosis present

## 2021-12-15 DIAGNOSIS — Z66 Do not resuscitate: Secondary | ICD-10-CM | POA: Diagnosis not present

## 2021-12-15 DIAGNOSIS — Z79899 Other long term (current) drug therapy: Secondary | ICD-10-CM

## 2021-12-15 DIAGNOSIS — I5042 Chronic combined systolic (congestive) and diastolic (congestive) heart failure: Secondary | ICD-10-CM | POA: Diagnosis present

## 2021-12-15 DIAGNOSIS — Z7951 Long term (current) use of inhaled steroids: Secondary | ICD-10-CM

## 2021-12-15 DIAGNOSIS — J9601 Acute respiratory failure with hypoxia: Secondary | ICD-10-CM | POA: Diagnosis not present

## 2021-12-15 DIAGNOSIS — Z992 Dependence on renal dialysis: Secondary | ICD-10-CM

## 2021-12-15 DIAGNOSIS — I10 Essential (primary) hypertension: Secondary | ICD-10-CM | POA: Diagnosis present

## 2021-12-15 DIAGNOSIS — D631 Anemia in chronic kidney disease: Secondary | ICD-10-CM | POA: Diagnosis present

## 2021-12-15 DIAGNOSIS — Z4682 Encounter for fitting and adjustment of non-vascular catheter: Secondary | ICD-10-CM | POA: Diagnosis not present

## 2021-12-15 DIAGNOSIS — N2581 Secondary hyperparathyroidism of renal origin: Secondary | ICD-10-CM | POA: Diagnosis present

## 2021-12-15 DIAGNOSIS — E860 Dehydration: Secondary | ICD-10-CM | POA: Diagnosis present

## 2021-12-15 DIAGNOSIS — E875 Hyperkalemia: Secondary | ICD-10-CM | POA: Diagnosis not present

## 2021-12-15 DIAGNOSIS — E861 Hypovolemia: Secondary | ICD-10-CM | POA: Diagnosis not present

## 2021-12-15 DIAGNOSIS — L03115 Cellulitis of right lower limb: Secondary | ICD-10-CM | POA: Diagnosis present

## 2021-12-15 DIAGNOSIS — E785 Hyperlipidemia, unspecified: Secondary | ICD-10-CM | POA: Diagnosis present

## 2021-12-15 DIAGNOSIS — I96 Gangrene, not elsewhere classified: Secondary | ICD-10-CM | POA: Diagnosis not present

## 2021-12-15 DIAGNOSIS — Z86718 Personal history of other venous thrombosis and embolism: Secondary | ICD-10-CM

## 2021-12-15 DIAGNOSIS — Z8249 Family history of ischemic heart disease and other diseases of the circulatory system: Secondary | ICD-10-CM

## 2021-12-15 DIAGNOSIS — Z803 Family history of malignant neoplasm of breast: Secondary | ICD-10-CM

## 2021-12-15 DIAGNOSIS — S90821A Blister (nonthermal), right foot, initial encounter: Secondary | ICD-10-CM | POA: Diagnosis present

## 2021-12-15 DIAGNOSIS — I700 Atherosclerosis of aorta: Secondary | ICD-10-CM | POA: Diagnosis not present

## 2021-12-15 DIAGNOSIS — E1151 Type 2 diabetes mellitus with diabetic peripheral angiopathy without gangrene: Secondary | ICD-10-CM | POA: Diagnosis not present

## 2021-12-15 DIAGNOSIS — K3184 Gastroparesis: Secondary | ICD-10-CM | POA: Diagnosis present

## 2021-12-15 DIAGNOSIS — I4891 Unspecified atrial fibrillation: Secondary | ICD-10-CM | POA: Diagnosis present

## 2021-12-15 DIAGNOSIS — E1143 Type 2 diabetes mellitus with diabetic autonomic (poly)neuropathy: Secondary | ICD-10-CM | POA: Diagnosis present

## 2021-12-15 DIAGNOSIS — N179 Acute kidney failure, unspecified: Secondary | ICD-10-CM | POA: Diagnosis not present

## 2021-12-15 DIAGNOSIS — I998 Other disorder of circulatory system: Secondary | ICD-10-CM | POA: Diagnosis not present

## 2021-12-15 DIAGNOSIS — Z955 Presence of coronary angioplasty implant and graft: Secondary | ICD-10-CM

## 2021-12-15 HISTORY — PX: PERIPHERAL VASCULAR INTERVENTION: CATH118257

## 2021-12-15 HISTORY — PX: PERIPHERAL VASCULAR BALLOON ANGIOPLASTY: CATH118281

## 2021-12-15 HISTORY — PX: ABDOMINAL AORTOGRAM W/LOWER EXTREMITY: CATH118223

## 2021-12-15 LAB — POCT ACTIVATED CLOTTING TIME: Activated Clotting Time: 206 seconds

## 2021-12-15 LAB — SURGICAL PCR SCREEN
MRSA, PCR: NEGATIVE
Staphylococcus aureus: POSITIVE — AB

## 2021-12-15 SURGERY — ABDOMINAL AORTOGRAM W/LOWER EXTREMITY
Anesthesia: LOCAL

## 2021-12-15 MED ORDER — HEPARIN SODIUM (PORCINE) 1000 UNIT/ML IJ SOLN
INTRAMUSCULAR | Status: DC | PRN
  Administered 2021-12-15: 5500 [IU] via INTRAVENOUS

## 2021-12-15 MED ORDER — ACETAMINOPHEN 325 MG PO TABS
650.0000 mg | ORAL_TABLET | ORAL | Status: DC | PRN
  Administered 2021-12-15: 650 mg via ORAL
  Filled 2021-12-15: qty 2

## 2021-12-15 MED ORDER — HEPARIN SODIUM (PORCINE) 1000 UNIT/ML DIALYSIS: 20.0000 [IU]/kg | INTRAMUSCULAR | Status: DC | PRN

## 2021-12-15 MED ORDER — FENTANYL CITRATE (PF) 100 MCG/2ML IJ SOLN
INTRAMUSCULAR | Status: AC
  Filled 2021-12-15: qty 2

## 2021-12-15 MED ORDER — MIDAZOLAM HCL 2 MG/2ML IJ SOLN
INTRAMUSCULAR | Status: AC
  Filled 2021-12-15: qty 2

## 2021-12-15 MED ORDER — HEPARIN (PORCINE) IN NACL 1000-0.9 UT/500ML-% IV SOLN
INTRAVENOUS | Status: AC
  Filled 2021-12-15: qty 1000

## 2021-12-15 MED ORDER — HEPARIN (PORCINE) IN NACL 1000-0.9 UT/500ML-% IV SOLN
INTRAVENOUS | Status: DC | PRN
  Administered 2021-12-15 (×2): 500 mL

## 2021-12-15 MED ORDER — MORPHINE SULFATE (PF) 2 MG/ML IV SOLN: 2.0000 mg | INTRAVENOUS | Status: DC | PRN

## 2021-12-15 MED ORDER — SODIUM CHLORIDE 0.9 % IV SOLN: 250.0000 mL | INTRAVENOUS | Status: DC | PRN

## 2021-12-15 MED ORDER — SODIUM CHLORIDE 0.9% FLUSH
3.0000 mL | Freq: Two times a day (BID) | INTRAVENOUS | Status: DC
  Administered 2021-12-16: 3 mL via INTRAVENOUS

## 2021-12-15 MED ORDER — ASPIRIN 81 MG PO TBEC
81.0000 mg | DELAYED_RELEASE_TABLET | Freq: Every day | ORAL | Status: DC
  Administered 2021-12-16: 81 mg via ORAL
  Filled 2021-12-15: qty 1

## 2021-12-15 MED ORDER — CLOPIDOGREL BISULFATE 75 MG PO TABS
75.0000 mg | ORAL_TABLET | Freq: Every day | ORAL | Status: DC
  Administered 2021-12-16: 75 mg via ORAL
  Filled 2021-12-15: qty 1

## 2021-12-15 MED ORDER — HEPARIN (PORCINE) 25000 UT/250ML-% IV SOLN: 650.0000 [IU]/h | INTRAVENOUS | Status: DC

## 2021-12-15 MED ORDER — ONDANSETRON HCL 4 MG/2ML IJ SOLN: 4.0000 mg | Freq: Four times a day (QID) | INTRAMUSCULAR | Status: DC | PRN

## 2021-12-15 MED ORDER — IODIXANOL 320 MG/ML IV SOLN
INTRAVENOUS | Status: DC | PRN
  Administered 2021-12-15: 230 mL via INTRA_ARTERIAL

## 2021-12-15 MED ORDER — LIDOCAINE HCL (PF) 1 % IJ SOLN
INTRAMUSCULAR | Status: DC | PRN
  Administered 2021-12-15 (×2): 10 mL

## 2021-12-15 MED ORDER — SODIUM CHLORIDE 0.9% IV SOLUTION: Freq: Once | INTRAVENOUS | Status: DC

## 2021-12-15 MED ORDER — CHLORHEXIDINE GLUCONATE CLOTH 2 % EX PADS: 6.0000 | MEDICATED_PAD | Freq: Every day | CUTANEOUS | Status: DC

## 2021-12-15 MED ORDER — HYDRALAZINE HCL 20 MG/ML IJ SOLN: 5.0000 mg | INTRAMUSCULAR | Status: DC | PRN

## 2021-12-15 MED ORDER — MIDAZOLAM HCL 2 MG/2ML IJ SOLN
INTRAMUSCULAR | Status: DC | PRN
  Administered 2021-12-15: 1 mg via INTRAVENOUS
  Administered 2021-12-15: .5 mg via INTRAVENOUS
  Administered 2021-12-15: 1 mg via INTRAVENOUS

## 2021-12-15 MED ORDER — MUPIROCIN 2 % EX OINT
1.0000 | TOPICAL_OINTMENT | Freq: Two times a day (BID) | CUTANEOUS | Status: DC
  Filled 2021-12-15: qty 22

## 2021-12-15 MED ORDER — OXYCODONE HCL 5 MG PO TABS: 5.0000 mg | ORAL_TABLET | ORAL | Status: DC | PRN

## 2021-12-15 MED ORDER — LIDOCAINE HCL (PF) 1 % IJ SOLN
INTRAMUSCULAR | Status: AC
  Filled 2021-12-15: qty 30

## 2021-12-15 MED ORDER — SODIUM CHLORIDE 0.9 % IV SOLN
INTRAVENOUS | Status: AC | PRN
  Administered 2021-12-15: 10 mL/h via INTRAVENOUS

## 2021-12-15 MED ORDER — SODIUM CHLORIDE 0.9% FLUSH: 3.0000 mL | INTRAVENOUS | Status: DC | PRN

## 2021-12-15 MED ORDER — LABETALOL HCL 5 MG/ML IV SOLN: 10.0000 mg | INTRAVENOUS | Status: DC | PRN

## 2021-12-15 MED ORDER — FENTANYL CITRATE (PF) 100 MCG/2ML IJ SOLN
INTRAMUSCULAR | Status: DC | PRN
  Administered 2021-12-15 (×3): 25 ug via INTRAVENOUS

## 2021-12-15 SURGICAL SUPPLY — 22 items
BALL STERLING OTW 2.5X100X150 (BALLOONS) ×2
BALLN MUSTANG 6X80X75 (BALLOONS) ×2
BALLN STERLING OTW 2.5X100X150 (BALLOONS) ×2
BALLOON MUSTANG 6X80X75 (BALLOONS) IMPLANT
BALLOON STRLNG OTW 2.5X100X150 (BALLOONS) IMPLANT
CATH OMNI FLUSH 5F 65CM (CATHETERS) IMPLANT
CATH SYNTRAX .035X150 (CATHETERS) IMPLANT
GLIDEWIRE ADV .035X260CM (WIRE) IMPLANT
KIT ENCORE 26 ADVANTAGE (KITS) IMPLANT
KIT MICROPUNCTURE NIT STIFF (SHEATH) IMPLANT
KIT PV (KITS) ×2 IMPLANT
SHEATH CATAPULT 6FR 45 (SHEATH) IMPLANT
SHEATH PINNACLE 5F 10CM (SHEATH) IMPLANT
SHEATH PINNACLE 6F 10CM (SHEATH) IMPLANT
SHEATH PINNACLE 7F 10CM (SHEATH) IMPLANT
SHEATH PROBE COVER 6X72 (BAG) IMPLANT
STENT ELUVIA 7X80X130 (Permanent Stent) IMPLANT
SYR MEDRAD MARK V 150ML (SYRINGE) IMPLANT
TRANSDUCER W/STOPCOCK (MISCELLANEOUS) ×2 IMPLANT
TRAY PV CATH (CUSTOM PROCEDURE TRAY) ×2 IMPLANT
WIRE BENTSON .035X145CM (WIRE) IMPLANT
WIRE G V18X300CM (WIRE) IMPLANT

## 2021-12-15 NOTE — Progress Notes (Signed)
Dr. Scot Dock notified about cold right foot and unable to find pulse. He will come to check on patient.

## 2021-12-15 NOTE — Plan of Care (Signed)
  Problem: RH Balance Goal: LTG Patient will maintain dynamic standing with ADLs (OT) Description: LTG:  Patient will maintain dynamic standing balance with assist during activities of daily living (OT)  Flowsheets (Taken 01/02/2022 1555) LTG: Pt will maintain dynamic standing balance during ADLs with: (downgraded due to decline in medical status, poor WB tolerance & hypersensitivity of the BLE) Contact Guard/Touching assist   Problem: Sit to Stand Goal: LTG:  Patient will perform sit to stand in prep for activites of daily living with assistance level (OT) Description: LTG:  Patient will perform sit to stand in prep for activites of daily living with assistance level (OT) Flowsheets (Taken 01/06/2022 1555) LTG: PT will perform sit to stand in prep for activites of daily living with assistance level: (downgraded due to decline in medical status, poor WB tolerance & hypersensitivity of the BLE) Contact Guard/Touching assist   Problem: RH Grooming Goal: LTG Patient will perform grooming w/assist,cues/equip (OT) Description: LTG: Patient will perform grooming with assist, with/without cues using equipment (OT) Flowsheets (Taken 12/24/2021 1555) LTG: Pt will perform grooming with assistance level of: (downgraded due to decline in medical status, poor WB tolerance & hypersensitivity of the BLE) Set up assist    Problem: RH Bathing Goal: LTG Patient will bathe all body parts with assist levels (OT) Description: LTG: Patient will bathe all body parts with assist levels (OT) Flowsheets (Taken 12/31/2021 1555) LTG: Pt will perform bathing with assistance level/cueing: (downgraded due to decline in medical status, poor WB tolerance & hypersensitivity of the BLE) Set up assist    Problem: RH Dressing Goal: LTG Patient will perform upper body dressing (OT) Description: LTG Patient will perform upper body dressing with assist, with/without cues (OT). Flowsheets (Taken 01/09/2022 1555) LTG: Pt will perform  upper body dressing with assistance level of: (downgraded due to decline in medical status, poor WB tolerance & hypersensitivity of the BLE) Set up assist Goal: LTG Patient will perform lower body dressing w/assist (OT) Description: LTG: Patient will perform lower body dressing with assist, with/without cues in positioning using equipment (OT) Flowsheets (Taken 12/23/2021 1555) LTG: Pt will perform lower body dressing with assistance level of: (downgraded due to decline in medical status, poor WB tolerance & hypersensitivity of the BLE) Supervision/Verbal cueing   Problem: RH Toileting Goal: LTG Patient will perform toileting task (3/3 steps) with assistance level (OT) Description: LTG: Patient will perform toileting task (3/3 steps) with assistance level (OT)  Flowsheets (Taken 12/21/2021 1555) LTG: Pt will perform toileting task (3/3 steps) with assistance level: (downgraded due to decline in medical status, poor WB tolerance & hypersensitivity of the BLE) Minimal Assistance - Patient > 75%   Problem: RH Toilet Transfers Goal: LTG Patient will perform toilet transfers w/assist (OT) Description: LTG: Patient will perform toilet transfers with assist, with/without cues using equipment (OT) Flowsheets (Taken 12/21/2021 1555) LTG: Pt will perform toilet transfers with assistance level of: (downgraded due to decline in medical status, poor WB tolerance & hypersensitivity of the BLE) Contact Guard/Touching assist   Problem: RH Tub/Shower Transfers Goal: LTG Patient will perform tub/shower transfers w/assist (OT) Description: LTG: Patient will perform tub/shower transfers with assist, with/without cues using equipment (OT) Flowsheets (Taken 12/30/2021 1555) LTG: Pt will perform tub/shower stall transfers with assistance level of: (downgraded due to decline in medical status, poor WB tolerance & hypersensitivity of the BLE) Contact Guard/Touching assist

## 2021-12-15 NOTE — Progress Notes (Deleted)
  The note originally documented on this encounter has been moved the the encounter in which it belongs.  

## 2021-12-15 NOTE — Interval H&P Note (Signed)
History and Physical Interval Note:  12/21/2021 9:55 AM  Susan Horton  has presented today for surgery, with the diagnosis of ischemia with tissue at the foot.  The various methods of treatment have been discussed with the patient and family. After consideration of risks, benefits and other options for treatment, the patient has consented to  Procedure(s): ABDOMINAL AORTOGRAM W/LOWER EXTREMITY (N/A) as a surgical intervention.  The patient's history has been reviewed, patient examined, no change in status, stable for surgery.  I have reviewed the patient's chart and labs.  Questions were answered to the patient's satisfaction.     [name]

## 2021-12-15 NOTE — Progress Notes (Signed)
[facility] Progress Note   Subjective:  Seen in room - going for aortogram with LE run off shortly. Denies CP/dyspnea, but + leg pain and not feeling great in general. Did fine with HD yesterday - net UF 628m.  Objective Vitals:   12/14/21 1800 12/14/21 2004 01/07/2022 0332 01/06/2022 0350  BP: 98/73 (!) 107/44 (!) 93/45   Pulse: 92 (!) 101 64   Resp: '17 16 18   '$ Temp: 98.7 F (37.1 C) 97.7 F (36.5 C) 97.8 F (36.6 C)   TempSrc:  Oral Oral   SpO2: 98%  95%   Weight: 57.1 kg   53.6 kg  Height:       Physical Exam General: Chronically ill appearing woman, NAD. Heart: RRR; no murmur Lungs: CTAB Abdomen: soft Extremities: No LE edema; L foot and hip bandaged Dialysis Access: LUE AVF + bruit  Additional Objective Labs: Basic Metabolic Panel: Recent Labs  Lab 12/09/21 1253 12/14/21 1512  NA 135 133*  K 4.4 4.0  CL 88* 95*  CO2 27 23  GLUCOSE 80 77  BUN 43* 44*  CREATININE 5.05* 5.96*  CALCIUM 8.0* 7.1*  PHOS 6.7* 6.6*   Liver Function Tests: Recent Labs  Lab 12/09/21 1253 12/14/21 1512  ALBUMIN 2.0* <1.5*   CBC: Recent Labs  Lab 12/09/21 1253 12/14/21 1512  WBC 14.4* 17.4*  NEUTROABS 12.6* 14.9*  HGB 8.5* 7.2*  HCT 28.7* 25.0*  MCV 78.8* 78.1*  PLT 450* 463*   Medications:   [MAR Hold] (feeding supplement) PROSource Plus  30 mL Oral BID BM   [MAR Hold] aspirin  81 mg Oral Daily   [MAR Hold] atorvastatin  40 mg Oral Daily   [MAR Hold] Chlorhexidine Gluconate Cloth  6 each Topical Q0600   [MAR Hold] Chlorhexidine Gluconate Cloth  6 each Topical Q0600   [MAR Hold] clopidogrel  75 mg Oral Q breakfast   [MAR Hold] darbepoetin (ARANESP) injection - NON-DIALYSIS  200 mcg Subcutaneous Q Wed-1800   [MAR Hold] doxercalciferol  7 mcg Intravenous Q M,W,F-HD   [MAR Hold] DULoxetine  40 mg Oral Daily   [MAR Hold] heparin  5,000 Units Subcutaneous Q8H   [MAR Hold] lanthanum  1,000 mg Oral Q supper   [MAR Hold] leptospermum manuka honey  1  Application Topical Daily   [MAR Hold] melatonin  5 mg Oral QHS   [MAR Hold] metoCLOPramide  10 mg Oral TID AC   [MAR Hold] metoprolol succinate  12.5 mg Oral Daily   [MAR Hold] montelukast  10 mg Oral QHS   [MAR Hold] mupirocin ointment  1 Application Nasal BID   [MAR Hold] pantoprazole  40 mg Oral BID   [MAR Hold] thiamine  100 mg Oral Daily    Dialysis Orders: [facility] 3h 462m 400/600  61kg  2/2 bath P2  Hep 4000  LUE AVF - mircera 200 mcg IV q2, last 10/16, due 10/30 - venofer '100mg'$  IV tiw thru 11/06 - doxercalciferol 7 ug tiw - sod thiosulfate 25 gm iv tiw   Assessment/Plan: Septic shock - s/p course of Zosyn, now resolved -> CIR d/t debility. ESRD: Required [facility] on admit[facility] Continue heparin with HD. HD tomorrow. HTN/volume: BP low recently, euvolemic on exam.  Anemia of ESRD: Hgb 7.2. Continue max dose Aranesp 20059mweekly while here. No IV Fe with ferritin > 5000. Likely transfuse with next HD. Secondary HPTH: CorrCa ok, Phos high. She has trouble tolerating any binder secondary to gastroparesis, trying  Fosrenol once daily for now.  Nutrition: Alb extremely low, continue supplements as tolerated. Renal diet w/fluid restrictions. Tumoral calcinosis L hip - long-standing painful issue for her, on NaThio empirically as op, although hasn't helped. S/p "excision" with [facility] on 10/09/21. Recultured 11/2 - NGTD. F/u with ortho this week.  T2DM with severe gastroparesis - Ongoing issue, s/p esophageal myotomy 09/17/21. Has issues swallow/keeping pills down. L foot pain - Xray negative, uric acid normal. VVS consulted, s/p LE angiography with improvement in pain. Venous US w/no evidence of DVT.  Now worse again. MRI yesterday with diffuse edema through musculature - likely denervation versus myositis. VVS re-consulted due to gangrenous changes of toes: Plan for angiography on 12/5 to confirm blood flow is optimized, she will require a left toe amputation.   Leukocytosis - persistent, rising again. Per PMD. Myoclonus (resolved): severe, after initial consult. Felt due to meds (cefepime, gabapentin and narcotics). Dispo: Tentative d/c plan of 12/19/21  Veneta Penton, PA-C 01/02/2022, 10:29 AM  [facility]

## 2021-12-15 NOTE — Progress Notes (Addendum)
ACT 168 Right 5Fr arterial sheath removed, manual pressure held x 15 minutes with no hematoma or bleeding, post instructions reviewed.  Site 0. Per Dr Arita Miss, leave left femoral venous sheath for IV access due to very difficult IV access.  IV team in with [name], unsuccessful.  Pt refusing more sticks.

## 2021-12-15 NOTE — Progress Notes (Signed)
Pt code status listed as "Prior". Per Protocol, physician, Joylene Igo, MD paged regarding code status clarification. Per return call from Dr. Doren Custard, he's not familiar w/ pt & attending physician should address in the AM.

## 2021-12-15 NOTE — Progress Notes (Addendum)
ANTICOAGULATION CONSULT NOTE - Follow Up Consult  Pharmacy Consult for heparin Indication: PVD  Allergies  Allergen Reactions   Repatha [Evolocumab] Itching   Gabapentin Other (See Comments)    confusion   Zestril [Lisinopril] Cough    Patient Measurements: Height: '5\' 5"'$  (165.1 cm) IBW/kg (Calculated) : 57  Vital Signs: Temp: 98.5 F (36.9 C) (12/05 1637) Temp Source: Oral (12/05 1637) BP: 100/47 (12/05 1637) Pulse Rate: 100 (12/05 1637)  Labs: Recent Labs    12/14/21 1512  HGB 7.2*  HCT 25.0*  PLT 463*  CREATININE 5.96*    Estimated Creatinine Clearance: 9.4 mL/min (A) (by C-G formula based on SCr of 5.96 mg/dL (H)).   Medications:  Medications Prior to Admission  Medication Sig Dispense Refill Last Dose   albuterol (VENTOLIN HFA) 108 (90 Base) MCG/ACT inhaler Inhale 1-2 puffs into the lungs every 6 (six) hours as needed for wheezing or shortness of breath. 18 g 2    aspirin 81 MG chewable tablet Chew 1 tablet (81 mg total) by mouth daily. 30 tablet 0    atorvastatin (LIPITOR) 40 MG tablet TAKE 1 TABLET BY MOUTH EVERY DAY (Patient taking differently: Take 40 mg by mouth daily.) 90 tablet 1    B Complex-C-Zn-Folic Acid (DIALYVITE/ZINC) TABS Take 1 tablet by mouth daily.      calcitRIOL (ROCALTROL) 0.5 MCG capsule Take 1.5 mcg by mouth daily.      clopidogrel (PLAVIX) 75 MG tablet TAKE 1 TABLET (75 MG TOTAL) BY MOUTH DAILY WITH BREAKFAST. 90 tablet 3    cyclobenzaprine (FLEXERIL) 10 MG tablet Take 1 tablet (10 mg total) by mouth 2 (two) times daily as needed. 60 tablet 1    fluticasone furoate-vilanterol (BREO ELLIPTA) 200-25 MCG/ACT AEPB Inhale 1 puff into the lungs daily. 60 each 3    ipratropium-albuterol (DUONEB) 0.5-2.5 (3) MG/3ML SOLN Take 3 mLs by nebulization every 6 (six) hours as needed. 360 mL 2    iron sucrose (VENOFER) 20 MG/ML injection Iron Sucrose (Venofer) (Patient not taking: Reported on 11/08/2021)      Methoxy PEG-Epoetin Beta (MIRCERA IJ) Inject  into the skin.      metoCLOPramide (REGLAN) 10 MG tablet Take 1 tablet (10 mg total) by mouth 3 (three) times daily before meals.      metoprolol succinate (TOPROL-XL) 25 MG 24 hr tablet Take 1 tablet (25 mg total) by mouth daily. TAKE 1.5 TABLETS BY MOUTH DAILY      Misc. Devices MISC Electric scooter for mobility 1 each 0    montelukast (SINGULAIR) 10 MG tablet Take 1 tablet (10 mg total) by mouth at bedtime. 90 tablet 1    nitroGLYCERIN (NITROSTAT) 0.4 MG SL tablet Place 1 tablet (0.4 mg total) under the tongue every 5 (five) minutes as needed for chest pain. 25 tablet 2    ondansetron (ZOFRAN ODT) 4 MG disintegrating tablet Take 1 tablet (4 mg total) by mouth every 8 (eight) hours as needed for nausea or vomiting. 20 tablet 0    oxyCODONE 10 MG TABS Take 1-1.5 tablets (10-15 mg total) by mouth every 4 (four) hours as needed for moderate pain or severe pain.      pantoprazole (PROTONIX) 40 MG tablet Take 1 tablet (40 mg total) by mouth daily. (Patient not taking: Reported on 11/08/2021) 30 tablet 0    PRALUENT 75 MG/ML SOAJ INJECT 75 MG INTO THE SKIN EVERY 14 (FOURTEEN) DAYS. 2 mL 11    promethazine (PHENERGAN) 25 MG tablet Take 1 tablet (  25 mg total) by mouth every 8 (eight) hours as needed for nausea or vomiting. 30 tablet 1    sucralfate (CARAFATE) 1 GM/10ML suspension Take 10 mLs (1 g total) by mouth 4 (four) times daily -  with meals and at bedtime. 420 mL 0    Scheduled:   [START ON 12/16/2021] aspirin EC  81 mg Oral Daily   [START ON 12/16/2021] clopidogrel  75 mg Oral Q breakfast   sodium chloride flush  3 mL Intravenous Q12H    Assessment: 51 yo female with PVD s/p arteriogram with multilevel disease and severe tibial disease. Pharmacy consulted to dose heparin   -Hg= 7.2 (recently 7.0-8.5) -sheath removed ~ 4pm post cath  Goal of Therapy:  Heparin level 0.3-0.7 units/ml Monitor platelets by anticoagulation protocol: Yes   Plan:  -No heparin bolus -Start heparin 650 units/hr  at 10pm (~ 6 hrs after sheath removal) -Heparin level in 8 hours and daily wth CBC daily  Hildred Laser, PharmD Clinical Pharmacist **Pharmacist phone directory can now be found on [email or web address] ([email or web address]).  Listed under [place].  Addendum -She has no IV access (IV team has not been able to get access -Pharmacy asked to dose lovenox but lovenox is not typically used for patients with ESRD.  Hg is 7.2 and risk of starting lovenox tonight may be too high  Plan -Spoke with Dr. Scot Dock: hold lovenox for now -Will check a CBC in am and review options with team on 12/6  Hildred Laser, PharmD Clinical Pharmacist **Pharmacist phone directory can now be found on [email or web address] ([email or web address]).  Listed under [place].

## 2021-12-15 NOTE — Progress Notes (Signed)
Patient arrived to room 6E16 from cath lab holding.

## 2021-12-15 NOTE — Progress Notes (Signed)
Occupational Therapy Weekly Progress Note  Patient Details  Name: Susan Horton MRN: [id number] Date of Birth: [birth year]  Beginning of progress report period: January 07, 2022 End of progress report period: December 15, 2021  No STGs due to continued progression towards adjusted LTGs as all STG were previously met, however pt's LOS extended due change in medical status. Pt was initially able to complete most ADLs at supervision level and ambulate for needed ADL transfers at the supervision level with RW, however in the past several days has been unable to tolerate WB and has been hypersensitive to touch on BLE, with 2 toes noted to be gangrene on the LLE. Arousal level has declined and confusion increased despite medication adjustment. Pt had angiogram today (12/5) with plan for digital amputation procedure to be expected at later date. Therapy schedule adjusted to 15/7 due to declining activity tolerance with plan to increase frequency when status changes. Pt still requires IPR and skilled OT to promote independence for level of assist at discharge as pt currently requires bed level care and inability to transfer at all due to pain/lethargy. Family has been unable to complete education due to medical decline at this time and would benefit from hands on practice prior to d/c.  Patient continues to demonstrate the following deficits: muscle weakness, decreased cardiorespiratoy endurance, hypersensitivity of BLE, decreased memory and delayed processing, and decreased standing balance and decreased balance strategies and therefore will continue to benefit from skilled OT intervention to enhance overall performance with BADL.  Patient not progressing toward long term goals.  See goal revision..  Plan of care revisions: downgraded from mod I to CGA/min A with potential need to discharge standing goals.  OT Short Term Goals Week 1:  OT Short Term Goal 1 (Week 1): Pt will perform STS transfers with Mod A +  LRAD in preparation ADL tasks. OT Short Term Goal 1 - Progress (Week 1): Met OT Short Term Goal 2 (Week 1): Pt will engage in standing table-top activities >2 minutes with Mod A + LRAD in preparation for ADL tasks. OT Short Term Goal 2 - Progress (Week 1): Met OT Short Term Goal 3 (Week 1): Pt will demonstrate carry-over of LB compensatory dressing techniques with supervision. OT Short Term Goal 3 - Progress (Week 1): Met Week 2:  OT Short Term Goal 1 (Week 2): Pt will complete shower transfer with CGA using LRAD OT Short Term Goal 1 - Progress (Week 2): Met OT Short Term Goal 2 (Week 2): Pt will complete ambulatory toilet transfer with CGA using LRAD OT Short Term Goal 2 - Progress (Week 2): Met OT Short Term Goal 3 (Week 2): Pt will tolerate greater than 5 mins in stance using LRAD during table top activity in prep for ADL needs OT Short Term Goal 3 - Progress (Week 2): Met Week 3:  OT Short Term Goal 1 (Week 3): STG = LTG due to ELOS Week 4:  OT Short Term Goal 1 (Week 4): STG = LTG due to extension   Susan Horton E Susan Cancro, MS, OTR/L  01/06/2022, 4:03 PM

## 2021-12-15 NOTE — Op Note (Deleted)
  The note originally documented on this encounter has been moved the the encounter in which it belongs.  

## 2021-12-15 NOTE — Op Note (Signed)
Patient name: Susan Horton MRN: [id number] DOB: 1970/06/06 Sex: female  01/08/2022 Pre-operative Diagnosis: Left foot ischemia Post-operative diagnosis:  Same Surgeon:  [name] Procedure Performed:  1.  Ultrasound-guided access, right femoral artery  2.  Abdominal aortogram  3.  Bilateral lower extremity runoff  4.  Angioplasty, left posterior tibial artery  5.  Stent, right external iliac artery  6.  Conscious sedation, 67 minutes     Indications: This is a 51 year old female with end-stage renal disease who has undergone bilateral lower extremity revascularizations in the past.  She has ischemic changes to her left toes.  She comes in today for angiographic evaluation.  Procedure:  The patient was identified in the holding area and taken to room 8.  The patient was then placed supine on the table and prepped and draped in the usual sterile fashion.  A time out was called.  Conscious sedation was administered with the use of IV fentanyl and Versed under continuous physician and nurse monitoring.  Heart rate, blood pressure, and oxygen saturation were continuously monitored.  Total sedation time was 67 minutes.  Ultrasound was used to evaluate the right common femoral artery.  It was patent .  A digital ultrasound image was acquired.  A micropuncture needle was used to access the right common femoral artery under ultrasound guidance.  An 018 wire was advanced without resistance and a micropuncture sheath was placed.  The 018 wire was removed and a benson wire was placed.  The micropuncture sheath was exchanged for a 5 french sheath.  An omniflush catheter was advanced over the wire to the level of L-1.  An abdominal angiogram was obtained.  Followed by bilateral runoff Findings:   Aortogram: Heavily calcified infrarenal abdominal aorta without hemodynamically significant stenosis.  Bilateral renal arteries are atretic.  Bilateral common iliac arteries and associated stents are widely  patent.  The left external iliac artery and its stent are patent with less than 50% stenosis in the midportion.  There is a greater than 70% stenosis within the right external iliac artery.  Right Lower Extremity: Heavily calcified right common femoral artery.  The profundofemoral artery is calcified but patent.  There is limited evaluation of the proximal superficial femoral artery secondary to sheath obstruction.  The stents within the superficial femoral artery do appear to be patent.  There is a 50% stenosis within the mid superficial femoral artery.  The popliteal artery is widely patent.  The posterior tibial artery appears to be the dominant runoff.  Distal vessel evaluation was limited secondary to contrast timing.  She has diffuse disease on the foot.  Left Lower Extremity: The left common femoral profundofemoral artery are patent without significant stenosis.  The superficial femoral artery and its stents are widely patent.  The vessels are small in caliber.  There appears to be a high-grade lesion at the origin of the posterior tibial artery which appears to be the dominant runoff.  There is diffuse disease out on the foot  Intervention: After the above images were acquired the decision was made to proceed with intervention.  A 6 French 45 cm sheath was advanced into the left external iliac artery.  The patient was fully heparinized.  I then used a 035 quick cross catheter and a V-18 wire to gain access into the posterior tibial artery.  I then used a 2.5 x 100 Sterling balloon to perform balloon angioplasty of the posterior tibial artery.  Completion imaging showed a patent proximal  posterior tibial artery without stenosis.  The patient was not tolerating the procedure well at this time and was begging for me to stop.  We were limited on sedation because of her hypotension.  Therefore I did not persist with left leg additional intervention.  I did feel that her right external iliac artery needed to  be stented and so this was done with a 7 x 80 Elluvia stent and postdilated with a 6 mm balloon with no residual stenosis.  I exchanged out the long sheath for short 6 French sheath.  The patient taken holding it for sheath pull once her coagulation profile corrects  Impression:  #1  Successful stenting of a approximate 70% right external iliac artery stenosis  #2  Successful stenting of a greater than 95% proximal posterior tibial artery stenosis with a 2.5 mm balloon.  The patient has inline flow through her posterior tibial artery across the ankle with diffuse microvascular disease on the left    V. [name], M.D., [facility] [place] Office: [phone] Pager:  [phone]

## 2021-12-15 NOTE — Progress Notes (Signed)
Left venous femoral sheath pulled with Dr Scot Dock present ready to access Left upper arm venous graft if needed.  Pt hs no IV access after multiple attempts with IV team and Anesthesia team.  Pt refusing continued sticks.  Tolerated Venous sheath pull well, site level 0, no hematoma or bleeding.  Instructons reviewed.

## 2021-12-15 NOTE — Progress Notes (Signed)
Lelon Perla called by Dewaine Oats to attempt IV access. Ultrasound used to check for Vein. Patient states during this time that she does not want anyone else to stick her and that we can not do this anymore. We explained to the patient that if we were to pull the sheath in her Lt Femoral Vein and she were to get sick during this time we do not have to Access to give meds in. She still states she does not want this to happen. Harriet notified. Will continue to monitor patient and discuss other options regarding IV access.

## 2021-12-15 NOTE — Progress Notes (Signed)
VASCULAR SURGERY:  Called to see patient because no Doppler flow in the right foot and the right foot was cool.  She tells me that both legs hurt.  On exam she has a palpable femoral pulse.  She has a popliteal signal with the Doppler.  I cannot get Doppler signals in the right foot.  She is able to move her foot.  I reviewed her arteriogram.  She has multilevel disease and severe tibial disease.  Based on her arteriogram I really do not see any options for revascularization.  She had an iliac intervention on the right and does have a good femoral pulse.  I will start some intravenous heparin and we will follow this.  Gae Gallop, MD 5:30 PM

## 2021-12-15 NOTE — Progress Notes (Signed)
Occupational Therapy Session Note  Patient Details  Name: Susan Horton MRN: [id number] Date of Birth: Sep 18, 1970  Today's Date: 12/12/2021 OT Individual Time: 2482-5003 Total Treatment Time: 50 mins Missed OT Time: 45 mins (Medical Procedure)   Short Term Goals: Week 3:  OT Short Term Goal 1 (Week 3): STG = LTG due to ELOS  Skilled Therapeutic Interventions/Progress Updates:   Session 1: Pt received supine in bed for skilled OT session with focus on ADL retraining. Pt agreeable to interventions, demonstrating increased motivation for participation in therapy. Pt with un-rated pain, stating "I just want to get it over with. . . " in reference to her scheduled procedure. OT offering intermediate rest breaks and positioning suggestions throughout session to address pain/fatigue and maximize participation/safety in session.   Pt performs sup<>EOB with overall mod A for LE management and trunk elevation, using grab bars, HOB elevated, and increased time. Pt requires a lesser level of assistance to perform EOB>sup, managing BLE with min support and using BUEs to assist with boost towards HOB.  Seated EOB, pt requires increased time to doff scrub top, needing multiple trials and min A to successfully put head through top when donning. Pt then performs oral care with set-up and increased time for fatigue/lethargy.  Pt with complaints of decreased hearing this session, and continuing to require increased time to process/respond to prompts/cues.   Pt remained supine in bed with bilat ankles floated, all other immediate needs met and bed alarm activated at end of session. Pt continues to be appropriate for skilled OT intervention to promote further functional independence.    Session 2: Pt missing 45 minutes of skilled OT intervention due to medical procedure.   Therapy Documentation Precautions:  Precautions Precautions: Fall Precaution Comments: L foot drop, bilateral intermittent hearing  loss Restrictions Weight Bearing Restrictions: No    Therapy/Group: Individual Therapy  Maudie Mercury, OTR/L, MSOT  12/22/2021, 7:46 AM

## 2021-12-15 NOTE — Progress Notes (Signed)
Full skin assessment not completed due to patient having bilateral groin sticks. Skin assessment will be completed when straight leg time up at 2000.

## 2021-12-16 ENCOUNTER — Inpatient Hospital Stay (HOSPITAL_COMMUNITY): Payer: Medicare Other

## 2021-12-16 ENCOUNTER — Encounter (HOSPITAL_COMMUNITY): Payer: Self-pay | Admitting: Surgery

## 2021-12-16 DIAGNOSIS — Z992 Dependence on renal dialysis: Secondary | ICD-10-CM | POA: Diagnosis not present

## 2021-12-16 DIAGNOSIS — I739 Peripheral vascular disease, unspecified: Secondary | ICD-10-CM

## 2021-12-16 DIAGNOSIS — I5042 Chronic combined systolic (congestive) and diastolic (congestive) heart failure: Secondary | ICD-10-CM

## 2021-12-16 DIAGNOSIS — R197 Diarrhea, unspecified: Secondary | ICD-10-CM | POA: Diagnosis not present

## 2021-12-16 DIAGNOSIS — I480 Paroxysmal atrial fibrillation: Secondary | ICD-10-CM

## 2021-12-16 DIAGNOSIS — N186 End stage renal disease: Secondary | ICD-10-CM

## 2021-12-16 DIAGNOSIS — I4891 Unspecified atrial fibrillation: Secondary | ICD-10-CM | POA: Diagnosis present

## 2021-12-16 DIAGNOSIS — G9340 Encephalopathy, unspecified: Secondary | ICD-10-CM | POA: Diagnosis not present

## 2021-12-16 DIAGNOSIS — G9341 Metabolic encephalopathy: Secondary | ICD-10-CM | POA: Diagnosis not present

## 2021-12-16 DIAGNOSIS — I998 Other disorder of circulatory system: Secondary | ICD-10-CM

## 2021-12-16 DIAGNOSIS — I10 Essential (primary) hypertension: Secondary | ICD-10-CM | POA: Diagnosis not present

## 2021-12-16 DIAGNOSIS — R4182 Altered mental status, unspecified: Secondary | ICD-10-CM | POA: Diagnosis not present

## 2021-12-16 LAB — RENAL FUNCTION PANEL
Albumin: 1.6 g/dL — ABNORMAL LOW (ref 3.5–5.0)
Anion gap: 23 — ABNORMAL HIGH (ref 5–15)
BUN: 36 mg/dL — ABNORMAL HIGH (ref 6–20)
CO2: 18 mmol/L — ABNORMAL LOW (ref 22–32)
Calcium: 7.8 mg/dL — ABNORMAL LOW (ref 8.9–10.3)
Chloride: 93 mmol/L — ABNORMAL LOW (ref 98–111)
Creatinine, Ser: 5.15 mg/dL — ABNORMAL HIGH (ref 0.44–1.00)
GFR, Estimated: 10 mL/min — ABNORMAL LOW (ref >60)
Glucose, Bld: 102 mg/dL — ABNORMAL HIGH (ref 70–99)
Phosphorus: 6.7 mg/dL — ABNORMAL HIGH (ref 2.5–4.6)
Potassium: 3.8 mmol/L (ref 3.5–5.1)
Sodium: 134 mmol/L — ABNORMAL LOW (ref 135–145)

## 2021-12-16 LAB — LIPID PANEL
Cholesterol: 84 mg/dL (ref 0–200)
HDL: 23 mg/dL — ABNORMAL LOW (ref >40)
LDL Cholesterol: 39 mg/dL (ref 0–99)
Total CHOL/HDL Ratio: 3.7 RATIO
Triglycerides: 111 mg/dL (ref <150)
VLDL: 22 mg/dL (ref 0–40)

## 2021-12-16 LAB — BLOOD GAS, VENOUS
Acid-base deficit: 8.6 mmol/L — ABNORMAL HIGH (ref 0.0–2.0)
Bicarbonate: 14.5 mmol/L — ABNORMAL LOW (ref 20.0–28.0)
O2 Saturation: 97.8 %
Patient temperature: 37
pCO2, Ven: 24 mmHg — ABNORMAL LOW (ref 44–60)
pH, Ven: 7.39 (ref 7.25–7.43)
pO2, Ven: 136 mmHg — ABNORMAL HIGH (ref 32–45)

## 2021-12-16 LAB — CBC
HCT: 27.6 % — ABNORMAL LOW (ref 36.0–46.0)
Hemoglobin: 7.9 g/dL — ABNORMAL LOW (ref 12.0–15.0)
MCH: 22.3 pg — ABNORMAL LOW (ref 26.0–34.0)
MCHC: 28.6 g/dL — ABNORMAL LOW (ref 30.0–36.0)
MCV: 78 fL — ABNORMAL LOW (ref 80.0–100.0)
Platelets: 544 10*3/uL — ABNORMAL HIGH (ref 150–400)
RBC: 3.54 MIL/uL — ABNORMAL LOW (ref 3.87–5.11)
RDW: 18.9 % — ABNORMAL HIGH (ref 11.5–15.5)
WBC: 19 10*3/uL — ABNORMAL HIGH (ref 4.0–10.5)
nRBC: 0 % (ref 0.0–0.2)

## 2021-12-16 LAB — GLUCOSE, CAPILLARY
Glucose-Capillary: 109 mg/dL — ABNORMAL HIGH (ref 70–99)
Glucose-Capillary: 110 mg/dL — ABNORMAL HIGH (ref 70–99)
Glucose-Capillary: 127 mg/dL — ABNORMAL HIGH (ref 70–99)
Glucose-Capillary: 55 mg/dL — ABNORMAL LOW (ref 70–99)
Glucose-Capillary: 74 mg/dL (ref 70–99)
Glucose-Capillary: 89 mg/dL (ref 70–99)
Glucose-Capillary: 92 mg/dL (ref 70–99)
Glucose-Capillary: 98 mg/dL (ref 70–99)

## 2021-12-16 MED ORDER — SODIUM CHLORIDE 0.9 % IV BOLUS
250.0000 mL | Freq: Once | INTRAVENOUS | Status: AC
  Administered 2021-12-17: 250 mL via INTRAVENOUS

## 2021-12-16 MED ORDER — ALBUMIN HUMAN 25 % IV SOLN
25.0000 g | Freq: Once | INTRAVENOUS | Status: AC
  Administered 2021-12-16: 25 g via INTRAVENOUS
  Filled 2021-12-16: qty 100

## 2021-12-16 MED ORDER — GLUCAGON HCL RDNA (DIAGNOSTIC) 1 MG IJ SOLR
INTRAMUSCULAR | Status: AC
  Filled 2021-12-16: qty 1

## 2021-12-16 MED ORDER — GLUCAGON HCL RDNA (DIAGNOSTIC) 1 MG IJ SOLR
1.0000 mg | INTRAMUSCULAR | Status: AC
  Administered 2021-12-16: 1 mg via INTRAMUSCULAR

## 2021-12-16 MED ORDER — HEPARIN SODIUM (PORCINE) 1000 UNIT/ML IJ SOLN
INTRAMUSCULAR | Status: AC
  Filled 2021-12-16: qty 2

## 2021-12-16 MED ORDER — METOPROLOL TARTRATE 5 MG/5ML IV SOLN
10.0000 mg | Freq: Once | INTRAVENOUS | Status: DC
  Filled 2021-12-16: qty 10

## 2021-12-16 MED ORDER — THIAMINE HCL 100 MG/ML IJ SOLN: 100.0000 mg | Freq: Every day | INTRAMUSCULAR | Status: DC

## 2021-12-16 MED ORDER — DEXTROSE 50 % IV SOLN
25.0000 mL | Freq: Once | INTRAVENOUS | Status: AC
  Administered 2021-12-17: 25 mL via INTRAVENOUS

## 2021-12-16 NOTE — Progress Notes (Signed)
Received patient in bed to unit.  Alert and confused.   Informed consent signed and in chart.   Treatment initiated: 1510 Treatment completed: 1635  Patient was hypotensive throughout the tx. Pt is   Transported back to the room  Alert and confused. without acute distress.  Hand-off given to patient's nurse.   Access used: AVF Access issues: None  Total UF removed: 0 Medication(s) given: Albumin Post HD VS: 106/44,93,19,97.9 Post HD weight: no change.   [facility]

## 2021-12-16 NOTE — Progress Notes (Signed)
Stage 2 sacral w/ tunneling-wet to dry & mepilex dressing applied  Hip wound w/ purulent drainage-wet to dry & mepilex dressing applied  Wounds previously documented that general surgery following but no active wound care orders in place. WOC consult ordered for updated assessment & orders

## 2021-12-16 NOTE — Progress Notes (Signed)
ABG collected and sent to the lab

## 2021-12-16 NOTE — Progress Notes (Addendum)
Patient back from HD with HR 120s-130s, hypoglycemic BG 55, no IV access due to difficult access, doctors have been aware.  Patient c/o difficulty breathing.  Oxygen 4L started, soda given.  Dr. Trula Slade and rapid response made aware.

## 2021-12-16 NOTE — Assessment & Plan Note (Signed)
Possible new onset ECG  Showing sinus tachycardia w PAC Gently rehydrate, hold b.blockers given hypotension Transient was unable to document on ECG Will continue to monitor for recurrence

## 2021-12-16 NOTE — Consult Note (Incomplete)
NAME:  Susan Horton, MRN:  [id number], DOB:  Feb 09, 1970, LOS: 1 ADMISSION DATE:  12/24/2021 CONSULTATION DATE:  12/16/2021 REFERRING MD:  Roel Cluck - [facility] CHIEF COMPLAINT:  ***   History of Present Illness:  51 year old woman who presented to [facility] 12/5 for ***. PMHx significant for HTN, CAD with angina (s/p DES 2018), PVD, DVT (on ***), asthma, angioedema, ESRD on HD (TTS via ***), T2DM, GERD.    Pertinent Medical History:   Past Medical History:  Diagnosis Date   Abnormal stress test    Anemia    Angio-edema    Asthma    CAD (coronary artery disease)    DES to mid LAD July 2018, residual moderate RCA disease   Cataract    CKD (chronic kidney disease) stage 4, GFR 15-29 ml/min (HCC)    Dialysis T/Th/Sa   DVT (deep venous thrombosis) ([place])    1996 during pregnancy, 2015 left leg   Eczema    Essential hypertension 12/11/2015   Gastroparesis    GERD (gastroesophageal reflux disease)    Gluteal abscess 12/27/2018   Hidradenitis    Migraine    Neuropathy    Peripheral vascular disease (HCC)    blood clot in leg   Progressive angina ([place]) 06/05/2014   Chest pain   Type 2 diabetes mellitus ([place])    Type II diabetes mellitus ([place])    Urticaria    Significant Hospital Events: Including procedures, antibiotic start and stop dates in addition to other pertinent events     Interim History / Subjective:  ***  Objective:  Blood pressure (!) 99/41, pulse (!) 112, temperature (!) 97.4 F (36.3 C), temperature source Axillary, resp. rate 18, height '5\' 5"'$  (1.651 m), weight 56.7 kg, SpO2 100 %.        Intake/Output Summary (Last 24 hours) at 12/16/2021 2332 Last data filed at 12/16/2021 1850 Gross per 24 hour  Intake 160 ml  Output --  Net 160 ml   Filed Weights   12/16/21 1451 12/16/21 1456  Weight: 56.7 kg 56.7 kg   Physical Examination: General: {SRACUITY:25313} ill-appearing *** in NAD. HEENT: [place]/AT, anicteric sclera, PERRL, moist mucous membranes. Neuro: {SRLOC:25308}  {SRSTIMULI:25309} {SRCOMMANDS:25310} {SRNEUROEXTREMITIES:25312} Strength ***/5 in *** extremities. {SRRBRAINSTEM:25311}  CV: RRR, no m/g/r. PULM: Breathing even and unlabored on ***. Lung fields ***. GI: Soft, nontender, nondistended. Normoactive bowel sounds. Extremities: *** LE edema noted. Skin: Warm/dry, ***.  Resolved Hospital Problem List:    Assessment & Plan:  ***  Best Practice: (right click and "Reselect all SmartList Selections" daily)   Diet/type: {diet type:25684} DVT prophylaxis: {anticoagulation (Optional):25687} GI prophylaxis: {TO:67124} Lines: {Central Venous Access:25771} Foley:  {Central Venous Access:25691} Code Status:  {Code Status:26939} Last date of multidisciplinary goals of care discussion [***]  Labs:  CBC: Recent Labs  Lab 12/14/21 1512 12/16/21 0217  WBC 17.4* 19.0*  NEUTROABS 14.9*  --   HGB 7.2* 7.9*  HCT 25.0* 27.6*  MCV 78.1* 78.0*  PLT 463* 580*   Basic Metabolic Panel: Recent Labs  Lab 12/14/21 1512 12/16/21 0217  NA 133* 134*  K 4.0 3.8  CL 95* 93*  CO2 23 18*  GLUCOSE 77 102*  BUN 44* 36*  CREATININE 5.96* 5.15*  CALCIUM 7.1* 7.8*  PHOS 6.6* 6.7*   GFR: Estimated Creatinine Clearance: 11.6 mL/min (A) (by C-G formula based on SCr of 5.15 mg/dL (H)). Recent Labs  Lab 12/14/21 1512 12/16/21 0217  WBC 17.4* 19.0*   Liver Function Tests: Recent Labs  Lab 12/14/21  1512 12/16/21 0217  ALBUMIN <1.5* 1.6*   No results for input(s): "LIPASE", "AMYLASE" in the last 168 hours. No results for input(s): "AMMONIA" in the last 168 hours.  ABG:    Component Value Date/Time   HCO3 14.5 (L) 12/16/2021 2252   TCO2 30 07/18/2019 1111   ACIDBASEDEF 8.6 (H) 12/16/2021 2252   O2SAT 97.8 12/16/2021 2252    Coagulation Profile: No results for input(s): "INR", "PROTIME" in the last 168 hours.  Cardiac Enzymes: No results for input(s): "CKTOTAL", "CKMB", "CKMBINDEX", "TROPONINI" in the last 168 hours.  HbA1C: HbA1c, POC  (controlled diabetic range)  Date/Time Value Ref Range Status  07/30/2021 10:57 AM 5.3 0.0 - 7.0 % Final  03/22/2018 04:18 PM 9.8 (A) 0.0 - 7.0 % Final   Hgb A1c MFr Bld  Date/Time Value Ref Range Status  11/11/2021 02:56 AM 5.0 4.8 - 5.6 % Final    Comment:    (NOTE) Pre diabetes:          5.7%-6.4%  Diabetes:              >6.4%  Glycemic control for   <7.0% adults with diabetes   06/30/2020 02:46 AM 4.9 4.8 - 5.6 % Final    Comment:    (NOTE)         Prediabetes: 5.7 - 6.4         Diabetes: >6.4         Glycemic control for adults with diabetes: <7.0    CBG: Recent Labs  Lab 12/16/21 1111 12/16/21 1736 12/16/21 1758 12/16/21 2033 12/16/21 2235  GLUCAP 127* 55* 98 92 89   Review of Systems:   Review of systems completed with pertinent positives/negatives outlined in above HPI.  Past Medical History:  She,  has a past medical history of Abnormal stress test, Anemia, Angio-edema, Asthma, CAD (coronary artery disease), Cataract, CKD (chronic kidney disease) stage 4, GFR 15-29 ml/min ([place]), DVT (deep venous thrombosis) ([place]), Eczema, Essential hypertension (12/11/2015), Gastroparesis, GERD (gastroesophageal reflux disease), Gluteal abscess (12/27/2018), Hidradenitis, Migraine, Neuropathy, Peripheral vascular disease ([place]), Progressive angina ([place]) (06/05/2014), Type 2 diabetes mellitus ([place]), Type II diabetes mellitus ([place]), and Urticaria.   Surgical History:   Past Surgical History:  Procedure Laterality Date   A/V FISTULAGRAM N/A 06/22/2017   Procedure: A/V FISTULAGRAM - left arm;  Surgeon: [name], MD;  Location: [place] [facility];  Service: Cardiovascular;  Laterality: N/A;   ABDOMINAL AORTOGRAM W/LOWER EXTREMITY Bilateral 12/14/2018   Procedure: ABDOMINAL AORTOGRAM W/LOWER EXTREMITY;  Surgeon: [name], MD;  Location: [place] [facility];  Service: Cardiovascular;  Laterality: Bilateral;   ABDOMINAL AORTOGRAM W/LOWER EXTREMITY N/A  04/25/2019   Procedure: ABDOMINAL AORTOGRAM W/LOWER EXTREMITY;  Surgeon: [name], MD;  Location: [place] [facility];  Service: Cardiovascular;  Laterality: N/A;   ABDOMINAL AORTOGRAM W/LOWER EXTREMITY Left 07/18/2019   Procedure: ABDOMINAL AORTOGRAM W/LOWER EXTREMITY;  Surgeon: [name], MD;  Location: [place] [facility];  Service: Cardiovascular;  Laterality: Left;   ABDOMINAL AORTOGRAM W/LOWER EXTREMITY N/A 11/16/2021   Procedure: ABDOMINAL AORTOGRAM W/LOWER EXTREMITY;  Surgeon: [name], MD;  Location: [place] [facility];  Service: Cardiovascular;  Laterality: N/A;   ABDOMINAL AORTOGRAM W/LOWER EXTREMITY N/A 12/27/2021   Procedure: ABDOMINAL AORTOGRAM W/LOWER EXTREMITY;  Surgeon: Serafina Mitchell, MD;  Location: [place] [facility];  Service: Cardiovascular;  Laterality: N/A;   ABDOMINOPLASTY     [place]  AV FISTULA PLACEMENT Left 02/01/2017   Procedure: ARTERIOVENOUS BRACHIOCEPHALIC (AV) FISTULA CREATION;  Surgeon: [name], MD;  Location[facility];  Service: Vascular;  Laterality: Left;   [place]     CORONARY STENT INTERVENTION N/A 08/05/2016   Procedure: Coronary Stent Intervention;  Surgeon: [name], MD;  Location: [place] [facility];  Service: Cardiovascular;  Laterality: N/A;   ESOPHAGOGASTRODUODENOSCOPY (EGD) WITH PROPOFOL N/A 07/01/2020   Procedure: ESOPHAGOGASTRODUODENOSCOPY (EGD) WITH PROPOFOL;  Surgeon: Gatha Mayer, MD;  Location: [place];  Service: Endoscopy;  Laterality: N/A;   EXPLORATORY LAPAROTOMY  08/14/2005   lysis of adhesions, drainage of tubo-ovarian abscess   FISTULA SUPERFICIALIZATION Left 09/14/2017   Procedure: FISTULA SUPERFICIALIZATION ARTERIOVENOUS FISTULA LEFT ARM;  Surgeon: [name], MD;  Location: [place];  Service: Vascular;  Laterality: Left;   FLEXIBLE SIGMOIDOSCOPY N/A 01/04/2019   Procedure: FLEXIBLE  SIGMOIDOSCOPY;  Surgeon: Clarene Essex, MD;  Location: [place];  Service: Endoscopy;  Laterality: N/A;   HYDRADENITIS EXCISION  02/08/2011   Procedure: EXCISION HYDRADENITIS GROIN;  Surgeon: [name], MD;  Location: [place];  Service: General;  Laterality: N/A;  Excisioin of Hidradenitis Left groin   INCISION AND DRAINAGE ABSCESS N/A 01/11/2019   Procedure: INCISION AND DRAINAGE SACRAL ABSCESS;  Surgeon: Georganna Skeans, MD;  Location: [place];  Service: General;  Laterality: N/A;   INGUINAL HIDRADENITIS EXCISION  07/06/2010   bilateral   INSERTION OF DIALYSIS CATHETER N/A 09/14/2017   Procedure: INSERTION OF TUNNELED DIALYSIS CATHETER Right Internal Jugular;  Surgeon: [name], MD;  Location: [place];  Service: Vascular;  Laterality: N/A;   IR FLUORO GUIDE CV LINE RIGHT  03/01/2019   IR US GUIDE BX ASP/DRAIN  01/01/2019   IR US GUIDE VASC ACCESS RIGHT  03/01/2019   LEFT HEART CATH AND CORONARY ANGIOGRAPHY N/A 08/05/2016   Procedure: Left Heart Cath and Coronary Angiography;  Surgeon: [name], MD;  Location: [place] [facility];  Service: Cardiovascular;  Laterality: N/A;   PERIPHERAL VASCULAR BALLOON ANGIOPLASTY  01/06/2022   Procedure: PERIPHERAL VASCULAR BALLOON ANGIOPLASTY;  Surgeon: Serafina Mitchell, MD;  Location: [place] [facility];  Service: Cardiovascular;;   PERIPHERAL VASCULAR INTERVENTION Left 12/14/2018   Procedure: PERIPHERAL VASCULAR INTERVENTION;  Surgeon: [name], MD;  Location: [place] [facility];  Service: Cardiovascular;  Laterality: Left;  SFA   PERIPHERAL VASCULAR INTERVENTION Left 04/25/2019   Procedure: PERIPHERAL VASCULAR INTERVENTION;  Surgeon: [name], MD;  Location: [place] [facility];  Service: Cardiovascular;  Laterality: Left;  right superficial femoral, and left iliac   PERIPHERAL VASCULAR INTERVENTION Left 07/18/2019   Procedure: PERIPHERAL VASCULAR INTERVENTION;  Surgeon: [name], MD;   Location: [place] [facility];  Service: Cardiovascular;  Laterality: Left;  external iliac   PERIPHERAL VASCULAR INTERVENTION  12/12/2021   Procedure: PERIPHERAL VASCULAR INTERVENTION;  Surgeon: Serafina Mitchell, MD;  Location: [place] [facility];  Service: Cardiovascular;;   REDUCTION MAMMAPLASTY  2002   TONSILLECTOMY     TUBAL LIGATION  1996   VAGINAL HYSTERECTOMY  08/04/2005   and cysto   Social History:   reports that she quit smoking about 5 years ago. Her smoking use included cigarettes. She has never used smokeless tobacco. She reports current drug use. Frequency: 2.00 times per week. Drug: Marijuana. She reports that she does not drink alcohol.   Family History:  Her family  history includes Allergic rhinitis in her brother, father, mother, and sister; Breast cancer in her cousin; Hypertension in her brother, father, and sister.   Allergies: Allergies  Allergen Reactions   Penicillin G Anaphylaxis, Itching, Swelling and Rash    unknown   Repatha [Evolocumab] Itching   Gabapentin Other (See Comments)    confusion   Zestril [Lisinopril] Cough   Home Medications: Prior to Admission medications   Medication Sig Start Date End Date Taking? Authorizing Provider  albuterol (VENTOLIN HFA) 108 (90 Base) MCG/ACT inhaler Inhale 1-2 puffs into the lungs every 6 (six) hours as needed for wheezing or shortness of breath. 07/02/20  Yes Mariel Aloe, MD  cyclobenzaprine (FLEXERIL) 10 MG tablet Take 1 tablet (10 mg total) by mouth 2 (two) times daily as needed. 08/06/20  Yes Newlin, Enobong, MD  fluticasone furoate-vilanterol (BREO ELLIPTA) 200-25 MCG/ACT AEPB Inhale 1 puff into the lungs daily. 11/05/21  Yes Argentina Donovan, PA-C  HYDROcodone-acetaminophen (NORCO/VICODIN) 5-325 MG table Take 1 tablet by mouth every 4 (four) hours as needed. 10/09/21  Yes [provider]  ipratropium-albuterol (DUONEB) 0.5-2.5 (3) MG/3ML SOLN Take 3 mLs by nebulization every 6 (six) hours as needed.  07/02/20  Yes Mariel Aloe, MD  montelukast (SINGULAIR) 10 MG tablet Take 1 tablet (10 mg total) by mouth at bedtime. 11/05/21  Yes McClung, Dionne Bucy, PA-C  oxyCODONE 10 MG TABS Take 1-1.5 tablets (10-15 mg total) by mouth every 4 (four) hours as needed for moderate pain or severe pain. 11/23/21  Yes Patrecia Pour, MD  promethazine (PHENERGAN) 25 MG tablet Take 1 tablet (25 mg total) by mouth every 8 (eight) hours as needed for nausea or vomiting. 07/30/21  Yes Charlott Rakes, MD  aspirin 81 MG chewable tablet Chew 1 tablet (81 mg total) by mouth daily. Patient not taking: Reported on 12/16/2021 08/07/16   Reino Bellis B, NP  atorvastatin (LIPITOR) 40 MG tablet TAKE 1 TABLET BY MOUTH EVERY DAY Patient not taking: Reported on 12/16/2021 12/22/20   [name], MD  B Complex-C-Zn-Folic Acid (DIALYVITE/ZINC) TABS Take 1 tablet by mouth daily. Patient not taking: Reported on 12/16/2021 03/25/20   [provider]  calcitRIOL (ROCALTROL) 0.5 MCG capsule Take 1.5 mcg by mouth daily. Patient not taking: Reported on 12/16/2021 09/11/18   [provider]  clopidogrel (PLAVIX) 75 MG tablet TAKE 1 TABLET (75 MG TOTAL) BY MOUTH DAILY WITH BREAKFAST. Patient not taking: Reported on 12/16/2021 07/28/20   [name], MD  Difelikefalin Acetate [facility]) 65 MCG/1.3ML SOLN [place] (difelikefalin) IVP Maintenance Dose Patient not taking: Reported on 12/16/2021 08/13/21 08/12/22  [provider]  docusate sodium (COLACE) 100 MG capsule Take 100 mg by mouth 2 (two) times daily. Patient not taking: Reported on 12/16/2021 10/09/21   [provider]  iron sucrose (VENOFER) 20 MG/ML injection Iron Sucrose (Venofer) Patient not taking: Reported on 11/08/2021 07/11/19   [provider]  Methoxy PEG-Epoetin Beta (MIRCERA IJ) Inject into the skin. Patient not taking: Reported on 12/16/2021 07/09/19   [provider]  metoCLOPramide (REGLAN) 10 MG tablet Take 1 tablet (10  mg total) by mouth 3 (three) times daily before meals. 11/23/21   Patrecia Pour, MD  metoprolol succinate (TOPROL-XL) 25 MG 24 hr tablet Take 1 tablet (25 mg total) by mouth daily. TAKE 1.5 TABLETS BY MOUTH DAILY 11/23/21   Patrecia Pour, MD  Misc. Devices MISC Electric scooter for mobility 06/24/21   Charlott Rakes, MD  nitroGLYCERIN (NITROSTAT) 0.4 MG SL tablet Place 1 tablet (0.4 mg total) under the tongue every 5 (five) minutes as needed for chest pain. 08/06/19   [name], MD  PRALUENT 75 MG/ML SOAJ INJECT 75 MG INTO THE SKIN EVERY 14 (FOURTEEN) DAYS. Patient not taking: Reported on 12/16/2021 02/07/20   [name], MD   Critical care time: ***   Rhae Lerner [facility] 12/16/21 11:32 PM  Please see [email or web address] for pager details.  From 7A-7P if no response, please call [phone] After hours, please call ELink [phone]

## 2021-12-16 NOTE — Consult Note (Signed)
[facility] Nurse Consult Note: Reason for Consult: wounds, patient seen previously by [facility] during inpatient stay. Has been readmitted from inpatient rehab to inpatient acute care  Wound type: Chronic non healing wound; hip; 100% pink, xeroform gauze and foam dressing Chronic non healing wound; prior abscess and surgical intervention in 2020; pink, pale, non granular  Pressure Injury POA: NA Measurement: see nursing flow sheets Wound bed: see above  Drainage (amount, consistency, odor) none Periwound: intact  Dressing procedure/placement/frequency: Clean sacrum and hip wounds with saline, pat dry Apply xeroform to the hip wound and cover with foam Apply silver hydrofiber to the sacral wound, cover with foam.   See orders in chart.   Re consult if needed, will not follow at this time. Thanks  Agustin Swatek [employer], RN,CWOCN, CNS, [place] [phone])

## 2021-12-16 NOTE — Progress Notes (Addendum)
Progress Note    12/16/2021 8:27 AM * No surgery found *  Subjective:  still having some leg pain    Vitals:   12/16/21 0612 12/16/21 0744  BP: (!) 95/53 (!) 97/51  Pulse: 90 (!) 107  Resp: 17 17  Temp: 98.1 F (36.7 C) 97.8 F (36.6 C)  SpO2: 100% 99%   Physical Exam: Cardiac:  regular Lungs:  non labored Extremities:  left femoral access site clean, dry and intact without swelling or hematoma Abdomen:  soft, non distended Neurologic: alert and oriented   CBC    Component Value Date/Time   WBC 19.0 (H) 12/16/2021 0217   RBC 3.54 (L) 12/16/2021 0217   HGB 7.9 (L) 12/16/2021 0217   HGB 9.2 (L) 07/26/2016 0000   HCT 27.6 (L) 12/16/2021 0217   HCT 30.7 (L) 07/26/2016 0000   PLT 544 (H) 12/16/2021 0217   PLT 474 (H) 07/26/2016 0000   MCV 78.0 (L) 12/16/2021 0217   MCV 82 07/26/2016 0000   MCH 22.3 (L) 12/16/2021 0217   MCHC 28.6 (L) 12/16/2021 0217   RDW 18.9 (H) 12/16/2021 0217   RDW 13.6 07/26/2016 0000   LYMPHSABS 1.3 12/14/2021 1512   LYMPHSABS 2.9 07/26/2016 0000   MONOABS 1.0 12/14/2021 1512   EOSABS 0.0 12/14/2021 1512   EOSABS 0.1 07/26/2016 0000   BASOSABS 0.1 12/14/2021 1512   BASOSABS 0.0 07/26/2016 0000    BMET    Component Value Date/Time   NA 134 (L) 12/16/2021 0217   NA 132 (L) 07/26/2016 0000   K 3.8 12/16/2021 0217   CL 93 (L) 12/16/2021 0217   CO2 18 (L) 12/16/2021 0217   GLUCOSE 102 (H) 12/16/2021 0217   BUN 36 (H) 12/16/2021 0217   BUN 39 (H) 07/26/2016 0000   CREATININE 5.15 (H) 12/16/2021 0217   CALCIUM 7.8 (L) 12/16/2021 0217   CALCIUM 8.4 (L) 06/21/2017 1939   GFRNONAA 10 (L) 12/16/2021 0217   GFRAA 6 (L) 06/08/2019 1506    INR    Component Value Date/Time   INR 1.3 (H) 11/09/2021 0422    No intake or output data in the 24 hours ending 12/16/21 0827   Assessment/Plan:  51 y.o. female is s/p Angiogram BLE with Angioplasty left posterior tibial artery and stenting of right external iliac artery post operative day  1  Noted to have severe diffuse disease into bilateral feet Continues to have pain Femoral access site without swelling or hematoma Left doppler PT signal, no signals in right foot. Doppler right popliteal signal 2+ bilateral femoral pulses  At high risk for requiring more proximal amputations Continue Aspirin and plavix From our standpoint she is stable to return to rehab She was in CIR. Plan to return to CIR today  Karoline Caldwell, [facility] [phone] 12/16/2021 8:27 AM  I had a lengthy discussion with the patient and her sister at the bedside.  She has ischemic changes to the toes on the left.  She underwent angiography yesterday and had angioplasty of her left posterior tibial artery which does now provide blood flow across the ankle.  I feel her blood flow on the left is optimized.  She also had stenting of a right external iliac stenosis.  She has residual common femoral disease as well as a stenosis in her right superficial femoral artery below her stents.  She also has new blistering on the right foot.  I discussed with the patient and family that the neck step would  be to optimize blood flow on the right.  This would require a right femoral endarterectomy and antegrade stenting of the right superficial femoral artery.  The patient is very reluctant to have toe amputations done on the left.  With new blisters on the right I am also worried about the salvageability of the right foot.  Therefore we agreed to optimize her blood flow and then evaluate her feet and let them demarcate.  I discussed the possibility of bilateral lower extremity amputations.  For taking such drastic measures, we will give her every opportunity to heal her wounds however she has significant microvascular disease on her foot which will make this problematic.  Therefore on Friday she will undergo right femoral endarterectomy and stenting.  She will remain in the hospital until her surgery  and then hopefully we can get her back to rehab.  [name]

## 2021-12-16 NOTE — Progress Notes (Signed)
BG=98, patient states feeling still short of breath but better, saturation 100% on 4L [place], reduced to 2L. Needs addressed, no distress at this time.

## 2021-12-16 NOTE — Progress Notes (Signed)
Please update daughter Susan Horton after daily rounding. Per phone call from daughter 12/5 PM, she was expecting an update post 12/5 procedure.

## 2021-12-16 NOTE — Progress Notes (Signed)
[facility] Progress Note   Subjective:  Seen in room - she is awake but not herself, very quiet and only minimally responsive to questioning, appears to be on the brink of tears, ?pain, ?depression. Denies CP/dyspnea. S/p intervention to LLE yesterday with stents to R external iliac artery and L posterior tibial artery. Transferring back to CIR today.  Objective Vitals:   12/16/21 0307 12/16/21 0612 12/16/21 0744 12/16/21 1012  BP:  (!) 95/53 (!) 97/51 (!) 97/46  Pulse: (!) 53 90 (!) 107 (!) 110  Resp:  '17 17 18  '$ Temp:  98.1 F (36.7 C) 97.8 F (36.6 C) 97.8 F (36.6 C)  TempSrc:  Oral Axillary Oral  SpO2:  100% 99% 99%  Height:       Physical Exam General: Chronically ill appearing woman, NAD. Very flat expression/affect Heart: RRR; no murmur Lungs: CTAB Abdomen: soft Extremities: No LE edema; L foot and hip bandaged Dialysis Access: LUE AVF + bruit  Additional Objective Labs: Basic Metabolic Panel: Recent Labs  Lab 12/09/21 1253 12/14/21 1512 12/16/21 0217  NA 135 133* 134*  K 4.4 4.0 3.8  CL 88* 95* 93*  CO2 27 23 18*  GLUCOSE 80 77 102*  BUN 43* 44* 36*  CREATININE 5.05* 5.96* 5.15*  CALCIUM 8.0* 7.1* 7.8*  PHOS 6.7* 6.6* 6.7*   Liver Function Tests: Recent Labs  Lab 12/09/21 1253 12/14/21 1512 12/16/21 0217  ALBUMIN 2.0* <1.5* 1.6*   CBC: Recent Labs  Lab 12/09/21 1253 12/14/21 1512 12/16/21 0217  WBC 14.4* 17.4* 19.0*  NEUTROABS 12.6* 14.9*  --   HGB 8.5* 7.2* 7.9*  HCT 28.7* 25.0* 27.6*  MCV 78.8* 78.1* 78.0*  PLT 450* 463* 544*   Studies/Results: PERIPHERAL VASCULAR CATHETERIZATION  Result Date: 12/16/2021 Images from the original result were not included. Patient name: MEL TADROS MRN: [id number] DOB: 10-May-1970 Sex: female 01/02/2022 Pre-operative Diagnosis: Left foot ischemia Post-operative diagnosis:  Same Surgeon:  [name] Procedure Performed:  1.  Ultrasound-guided access, right femoral artery  2.  Abdominal  aortogram  3.  Bilateral lower extremity runoff  4.  Angioplasty, left posterior tibial artery  5.  Stent, right external iliac artery  6.  Conscious sedation, 67 minutes  Indications: This is a 51 year old female with end-stage renal disease who has undergone bilateral lower extremity revascularizations in the past.  She has ischemic changes to her left toes.  She comes in today for angiographic evaluation. Procedure:  The patient was identified in the holding area and taken to room 8.  The patient was then placed supine on the table and prepped and draped in the usual sterile fashion.  A time out was called.  Conscious sedation was administered with the use of IV fentanyl and Versed under continuous physician and nurse monitoring.  Heart rate, blood pressure, and oxygen saturation were continuously monitored.  Total sedation time was 67 minutes.  Ultrasound was used to evaluate the right common femoral artery.  It was patent .  A digital ultrasound image was acquired.  A micropuncture needle was used to access the right common femoral artery under ultrasound guidance.  An 018 wire was advanced without resistance and a micropuncture sheath was placed.  The 018 wire was removed and a benson wire was placed.  The micropuncture sheath was exchanged for a 5 french sheath.  An omniflush catheter was advanced over the wire to the level of L-1.  An abdominal angiogram was obtained.  Followed by bilateral runoff Findings:  Aortogram: Heavily calcified infrarenal abdominal aorta without hemodynamically significant stenosis.  Bilateral renal arteries are atretic.  Bilateral common iliac arteries and associated stents are widely patent.  The left external iliac artery and its stent are patent with less than 50% stenosis in the midportion.  There is a greater than 70% stenosis within the right external iliac artery.  Right Lower Extremity: Heavily calcified right common femoral artery.  The profundofemoral artery is calcified  but patent.  There is limited evaluation of the proximal superficial femoral artery secondary to sheath obstruction.  The stents within the superficial femoral artery do appear to be patent.  There is a 50% stenosis within the mid superficial femoral artery.  The popliteal artery is widely patent.  The posterior tibial artery appears to be the dominant runoff.  Distal vessel evaluation was limited secondary to contrast timing.  She has diffuse disease on the foot.  Left Lower Extremity: The left common femoral profundofemoral artery are patent without significant stenosis.  The superficial femoral artery and its stents are widely patent.  The vessels are small in caliber.  There appears to be a high-grade lesion at the origin of the posterior tibial artery which appears to be the dominant runoff.  There is diffuse disease out on the foot Intervention: After the above images were acquired the decision was made to proceed with intervention.  A 6 French 45 cm sheath was advanced into the left external iliac artery.  The patient was fully heparinized.  I then used a 035 quick cross catheter and a V-18 wire to gain access into the posterior tibial artery.  I then used a 2.5 x 100 Sterling balloon to perform balloon angioplasty of the posterior tibial artery.  Completion imaging showed a patent proximal posterior tibial artery without stenosis.  The patient was not tolerating the procedure well at this time and was begging for me to stop.  We were limited on sedation because of her hypotension.  Therefore I did not persist with left leg additional intervention.  I did feel that her right external iliac artery needed to be stented and so this was done with a 7 x 80 Elluvia stent and postdilated with a 6 mm balloon with no residual stenosis.  I exchanged out the long sheath for short 6 French sheath.  The patient taken holding it for sheath pull once her coagulation profile corrects Impression:  #1  Successful stenting of a  approximate 70% right external iliac artery stenosis  #2  Successful stenting of a greater than 95% proximal posterior tibial artery stenosis with a 2.5 mm balloon.  The patient has inline flow through her posterior tibial artery across the ankle with diffuse microvascular disease on the left  V. [name], M.D., FACS [facility] Office: [phone] Pager:  [phone]   Medications:  sodium chloride      aspirin EC  81 mg Oral Daily   clopidogrel  75 mg Oral Q breakfast   sodium chloride flush  3 mL Intravenous Q12H    Dialysis Orders: [facility] 3h 47mn 400/600  61kg  2/2 bath P2  Hep 4000  LUE AVF - mircera 200 mcg IV q2, last 10/16, due 10/30 - venofer '100mg'$  IV tiw thru 11/06 - doxercalciferol 7 ug tiw - sod thiosulfate 25 gm iv tiw   Assessment/Plan: Septic shock - s/p course of Zosyn, now resolved -> CIR d/t debility. ESRD: Required CRRT on admit, [facility] Continue  heparin with HD. HD later today. HTN/volume: BP low recently, euvolemic on exam.  Anemia of ESRD: Hgb 7.9. Continue max dose Aranesp 222mg weekly while here. No IV Fe with ferritin > 5000. Secondary HPTH: CorrCa ok, Phos high. She has trouble tolerating any binder secondary to gastroparesis, trying Fosrenol once daily for now.  Nutrition: Alb extremely low, continue supplements as tolerated. Renal diet w/fluid restrictions. Tumoral calcinosis L hip - long-standing painful issue for her, on NaThio empirically as op, although hasn't helped. [facility] Recultured 11/2 - NGTD. F/u with ortho this week.  T2DM with severe gastroparesis - Ongoing issue, s/p esophageal myotomy 09/17/21. Has issues swallow/keeping pills down. L foot pain - Xray negative, uric acid normal. VVS consulted, s/p LE angiography with improvement in pain. Venous [place]/no evidence of DVT.  Now worse again. MRI yesterday with diffuse edema through musculature - likely denervation  versus myositis. VVS re-consulted due to gangrenous changes of toes, s/p stent to L posterior tibial artery - she will likely need left toe amputation.  Leukocytosis - persistent, rising again. Per PMD. Myoclonus (resolved): severe, after initial consult. Felt due to meds (cefepime, gabapentin and narcotics).  KVeneta Penton PA-C 12/16/2021, 11:42 AM  [employer]

## 2021-12-16 NOTE — Progress Notes (Signed)
Physical Therapy Note  Patient Details  Name: Susan Horton MRN: [id number] Date of Birth: 1970-08-05 Today's Date: 12/16/2021  Attempted to see pt for scheduled PT treatment on 12/5, however pt off unit for angiogram. Will attempt to make up time as pt becomes available. 30 minutes missed of skilled physical therapy.   [place], DPT  12/16/2021, 1:34 PM

## 2021-12-16 NOTE — Assessment & Plan Note (Signed)
As per primary team

## 2021-12-16 NOTE — TOC Initial Note (Signed)
Transition of Care [facility]) - Initial/Assessment Note    Patient Details  Name: Susan Horton MRN: [id number] Date of Birth: March 02, 1970  [facility] CM/SW Contact:    Bethena Roys, RN Phone Number: 12/16/2021, 2:15 PM  Clinical Narrative: Risk for readmission assessment completed. Patient was previously at CIR. Post   abdominal aortogram, bilateral lower extremity runoff, and angioplasty, left posterior tibial artery. Plan for amputation of left second and third toes on Friday 12-18-21. Case Manager will continue to follow for transition of care needs as the patient progresses.         Expected Discharge Plan: IP Rehab Facility Barriers to Discharge: Continued Medical Work up   Patient Goals and CMS Choice     Choice offered to / list presented to : NA  Expected Discharge Plan and Services Expected Discharge Plan: [place] In-house Referral: NA Discharge Planning Services: CM Consult Post Acute Care Choice: IP Rehab Living arrangements for the past 2 months: Apartment Expected Discharge Date: 12/16/21                 DME Agency: NA       HH Arranged: NA          Prior Living Arrangements/Services Living arrangements for the past 2 months: Apartment Lives with:: Self (has support of family.) Patient language and need for interpreter reviewed:: Yes        Need for Family Participation in Patient Care: Yes (Comment) Care giver support system in place?: Yes (comment)   Criminal Activity/Legal Involvement Pertinent to Current Situation/Hospitalization: No - Comment as needed  Activities of Daily Living Home Assistive Devices/Equipment: None ADL Screening (condition at time of admission) Patient's cognitive ability adequate to safely complete daily activities?: Yes Is the patient deaf or have difficulty hearing?: No Does the patient have difficulty seeing, even when wearing glasses/contacts?: No Does the patient have difficulty  concentrating, remembering, or making decisions?: No Patient able to express need for assistance with ADLs?: Yes Does the patient have difficulty dressing or bathing?: Yes Independently performs ADLs?: Yes (appropriate for developmental age) Does the patient have difficulty walking or climbing stairs?: Yes Weakness of Legs: Both Weakness of Arms/Hands: Left  Emotional Assessment Appearance:: Appears stated age       Alcohol / Substance Use: Not Applicable Psych Involvement: No (comment)  Admission diagnosis:  PAD (peripheral artery disease) ([place]) [I73.9] Patient Active Problem List   Diagnosis Date Noted   PAD (peripheral artery disease) ([place]) 12/24/2021   Morbid obesity ([place]) 12/11/2021   Sacral decubitus ulcer, stage II ([place]) 12/11/2021   Sensorineural hearing loss (SNHL) of both ears 11/17/2021   Pressure injury of skin 63/01/6008   Acute metabolic encephalopathy    Severe sepsis ([place])    Cellulitis    Anemia due to chronic kidney disease, on chronic dialysis ([place])    Altered mental status    Sepsis ([place]) 11/08/2021   Diabetes mellitus ([place]) 01/29/2021   Hypertension associated with stage 5 chronic kidney disease due to type 2 diabetes mellitus ([place]) 01/29/2021   Tumoral calcinosis 01/29/2021   Dysphagia    Nausea 09/12/2019   Diarrhea, unspecified 09/12/2019   Pruritus, unspecified 08/13/2019   Anaphylactic shock, unspecified, sequela 08/13/2019   Allergy, unspecified, sequela 08/13/2019   Unspecified cataract 03/19/2019   Unspecified asthma with (acute) exacerbation 03/19/2019   COVID-19 03/06/2019   Intractable nausea and vomiting 02/24/2019   Abdominal pain, epigastric 02/24/2019   Pneumonia of right lung due to  infectious organism 02/24/2019   Shortness of breath 01/19/2019   Cerebrovascular small vessel disease    Generalized weakness    Abscess of sacrum (HCC)    Gluteal abscess 12/25/2018   Moderate protein-calorie malnutrition ([place]) 12/19/2018   Other  disorders resulting from impaired renal tubular function 12/19/2018   Liver disease, unspecified 12/19/2018   Hyperkalemia 12/19/2018   Acidosis 12/19/2018   Hyperlipidemia 11/10/2018   History of arteriosclerotic vascular disease 11/10/2018   Carotid stenosis 11/10/2018   Symptomatic anemia 09/16/2018   Peripheral vascular disease, unspecified ([place]) 09/04/2018   Other disorders of bilirubin metabolism 01/20/2018   Other long term (current) drug therapy 11/28/2017   Other abnormal findings in urine 11/28/2017   Dependence on renal dialysis ([place]) 11/28/2017   ESRD on peritoneal dialysis ([place]) 94/80/1655   Complication of vascular dialysis catheter 09/15/2017   History of anemia due to CKD 07/18/2017   DVT (deep vein thrombosis) in pregnancy 07/18/2017   Encounter for removal of sutures 07/07/2017   Iron deficiency anemia, unspecified 06/30/2017   Secondary hyperparathyroidism of renal origin ([place]) 06/24/2017   Pain, unspecified 06/24/2017   Fever, unspecified 06/24/2017   ESRD (end stage renal disease) ([place]) 06/24/2017   Coagulation defect, unspecified ([place]) 06/24/2017   Anemia in chronic kidney disease 06/24/2017   Chronic combined systolic and diastolic congestive heart failure ([place]) 06/18/2017   Moderate persistent asthma 06/10/2017   Adjustment disorder with depressed mood 09/08/2016   CKD (chronic kidney disease) stage 4, GFR 15-29 ml/min (HCC)    CAD S/P mLAD PCI with DES 08/06/2016   Presence of drug coated stent in LAD coronary artery 08/06/2016   Atherosclerotic heart disease of native coronary artery without angina pectoris 07/11/2016   Carotid bruit present 06/15/2016   Dyslipidemia 06/15/2016   Smoker 06/15/2016   Neuropathy 03/10/2016   Essential hypertension, benign 12/11/2015   Left facial pain 03/04/2015   Abnormal sense of taste 03/04/2015   Gastroparesis    Uncontrolled type 2 diabetes mellitus with peripheral neuropathy 06/05/2014   Yeast infection of the  vagina 07/10/2010   PCP:  Charlott Rakes, MD Pharmacy:   [id number] [place] [id number]: [phone]: 3[place] [address]Phone: [phone]: [phone] Readmission Risk Interventions    12/16/2021    2:14 PM 11/17/2021    2:53 PM  Readmission Risk Prevention Plan  Transportation Screening Complete Complete  HRI or Home Care Consult Complete   Social Work Consult for Recovery Care Planning/Counseling Complete   Palliative Care Screening Not Applicable   Medication Review (Press photographer Referral to Pharmacy Complete  HRI or Home Care Consult  Complete  SW Recovery Care/Counseling Consult  Complete  Palliative Care Screening  Not Applicable  Skilled Nursing Facility  Complete

## 2021-12-16 NOTE — Progress Notes (Addendum)
Dr. Trula Slade made aware of patient condition.  Consult IV team IV access placement.  Rapid response at bedside.

## 2021-12-16 NOTE — Consult Note (Signed)
[facility] Medical Consultation  MARLAYA TURCK KZS:[id number] DOB: 03/31/1970 DOA: 12/30/2021 PCP: Charlott Rakes, MD   Requesting physician: Dr. Nechama Guard Date of consultation: 12/16/21   Reason for consultation: hypotension  Impression/Recommendations  Principal Problem:   PAD (peripheral artery disease) ([place]) Active Problems:   Essential hypertension, benign   Chronic combined systolic and diastolic congestive heart failure (HCC)   ESRD on peritoneal dialysis ([place])   Diarrhea   Acute metabolic encephalopathy   A-fib ([place])   Acute encephalopathy    Patient is critically ill, PCCM has been consulted, plan was to continue with IV fluids and midodrine but pt has decompensated and code blue was called pt was resuscitated and transferred to ICU  PAD (peripheral artery disease) ([place]) As per primary team  Essential hypertension, benign Hold home meds  ESRD on peritoneal dialysis [facility]) Nephrology is following recheck labs as pt did not received her HD tonight  A-fib [facility]) Possible new onset ECG  Showing sinus tachycardia w PAC Gently rehydrate, hold b.blockers given hypotension Transient was unable to document on ECG Will continue to monitor for recurrence     Chronic combined systolic and diastolic congestive heart failure ([place]) Appears dry will gently rehydrate  Acute encephalopathy In the setting of dehydration, hypotentinon, low BG  Diarrhea Send for C.diff, gastric panel  Acute metabolic encephalopathy In the setting of hypotention, and relative hypoglycemia Did not tolerate CT head VBG no hypercarbia Ammonia pending Plan was to Administer IV thiamin and follow by D25 as needed EEG in AM  CBG q2h    Thank you for this consultation.  Chief Complaint: altered mental status    HPI: 51yo w Hx of DM2, HTN, ESRD,Chronic combined systolic and diastolic congestive heart failure, Pad, ANEMIA, ASTHMA, gerd, cad W STENTS, lower extremity DVT,   Pt  with severe PAD complex course She underwent recent excision of tumoral calcinosis left hip/thigh, followed by severe sepsis and ICU stay Echocardiogram with ejection fraction of 45 to 50% right ventricular systolic function moderately reduced  Vascular surgery consulted 11/14/2021 due to left foot tenderness and known peripheral arterial disease and concern for occlusion of her stents. Aortogram completed undergoing stent of right common iliac artery as well as left common iliac artery with drug-coated balloon angioplasty left common femoral artery and stent of left SFA 11/16/2021 per Dr. [name]. Patient continued to have leukocytosis with all antibiotic treatment completed 11/08/2021 - 11/16/2021 and latest WBC 20,200. Initial concern for possible bacteremia in patient with vascular access on hemodialysis. Cultures done after antibiotics showing no       Had angiogram yesterday has central line in her groin She has an IV now Went in Afib w RVR hypotensive She just missed her HD Now looks more obtunded per family this has been ongoing for the past few days, BG in 80-90, not eating, nauseous, diarrhea  BP has been running low since yesterday afternoon  WBC is going up  Denis any CP reports leg pain that is ongoing Due to PAD Reports increased SOB  Review of Systems:,   Pertinent positives include:    Constitutional:  No weight loss, night sweats, Fevers, chills, weight loss fatigue  HEENT:  No headaches, Difficulty swallowing,Tooth/dental problems,Sore throat,  No sneezing, itching, ear ache, nasal congestion, post nasal drip,  Cardio-vascular:  No chest pain, Orthopnea, PND, anasarca, dizziness, palpitations.no Bilateral lower extremity swelling  GI:  No heartburn, indigestion, abdominal pain, nausea, vomiting, diarrhea, change in bowel habits, loss of appetite, melena, blood in  stool, hematemesis Resp:  No shortness of breath at rest. No dyspnea on exertion,No excess mucus, no productive  cough, No non-productive cough, No coughing up of blood.No change in color of mucus.No wheezing. Skin:  no rash or lesions. No jaundice GU:  no dysuria, change in color of urine, no urgency or frequency. No straining to urinate.  No flank pain.  Musculoskeletal:  No joint pain or no joint swelling. No decreased range of motion. No back pain.  Psych:  No change in mood or affect. No depression or anxiety. No memory loss.  Neuro: no localizing neurological complaints, no tingling, no weakness, no double vision, no gait abnormality, no slurred speech, no confusion   All systems reviewed and apart from [place] all are negative  Past Medical History:  Diagnosis Date   Abnormal stress test    Anemia    Angio-edema    Asthma    CAD (coronary artery disease)    DES to mid LAD July 2018, residual moderate RCA disease   Cataract    CKD (chronic kidney disease) stage 4, GFR 15-29 ml/min (HCC)    Dialysis T/Th/Sa   DVT (deep venous thrombosis) ([place])    1996 during pregnancy, 2015 left leg   Eczema    Essential hypertension 12/11/2015   Gastroparesis    GERD (gastroesophageal reflux disease)    Gluteal abscess 12/27/2018   Hidradenitis    Migraine    Neuropathy    Peripheral vascular disease (HCC)    blood clot in leg   Progressive angina ([place]) 06/05/2014   Chest pain   Type 2 diabetes mellitus ([place])    Type II diabetes mellitus ([place])    Urticaria    Past Surgical History:  Procedure Laterality Date   A/V FISTULAGRAM N/A 06/22/2017   Procedure: A/V FISTULAGRAM - left arm;  Surgeon: [name], MD;  Location: [place] CV LAB;  Service: Cardiovascular;  Laterality: N/A;   ABDOMINAL AORTOGRAM W/LOWER EXTREMITY Bilateral 12/14/2018   Procedure: ABDOMINAL AORTOGRAM W/LOWER EXTREMITY;  Surgeon: [name], MD;  Location: [place] CV LAB;  Service: Cardiovascular;  Laterality: Bilateral;   ABDOMINAL AORTOGRAM W/LOWER EXTREMITY N/A 04/25/2019   Procedure:  ABDOMINAL AORTOGRAM W/LOWER EXTREMITY;  Surgeon: [name], MD;  Location: [place] CV LAB;  Service: Cardiovascular;  Laterality: N/A;   ABDOMINAL AORTOGRAM W/LOWER EXTREMITY Left 07/18/2019   Procedure: ABDOMINAL AORTOGRAM W/LOWER EXTREMITY;  Surgeon: [name], MD;  Location: [place] CV LAB;  Service: Cardiovascular;  Laterality: Left;   ABDOMINAL AORTOGRAM W/LOWER EXTREMITY N/A 11/16/2021   Procedure: ABDOMINAL AORTOGRAM W/LOWER EXTREMITY;  Surgeon: [name], MD;  Location: [place] CV LAB;  Service: Cardiovascular;  Laterality: N/A;   ABDOMINAL AORTOGRAM W/LOWER EXTREMITY N/A 12/19/2021   Procedure: ABDOMINAL AORTOGRAM W/LOWER EXTREMITY;  Surgeon: Serafina Mitchell, MD;  Location: [place] CV LAB;  Service: Cardiovascular;  Laterality: N/A;   ABDOMINOPLASTY     ADENOIDECTOMY     APPENDECTOMY  1995   AV FISTULA PLACEMENT Left 02/01/2017   Procedure: ARTERIOVENOUS BRACHIOCEPHALIC (AV) FISTULA CREATION;  Surgeon: [name], MD;  Location[facility];  Service: Vascular;  Laterality: Left;   [place] INTERVENTION N/A 08/05/2016   Procedure: Coronary Stent Intervention;  Surgeon: [name], MD;  Location: [place] CV LAB;  Service: Cardiovascular;  Laterality: N/A;   ESOPHAGOGASTRODUODENOSCOPY (EGD) WITH PROPOFOL N/A 07/01/2020   Procedure:  ESOPHAGOGASTRODUODENOSCOPY (EGD) WITH PROPOFOL;  Surgeon: Gatha Mayer, MD;  Location: [facility] ENDOSCOPY;  Service: Endoscopy;  Laterality: N/A;   EXPLORATORY LAPAROTOMY  08/14/2005   lysis of adhesions, drainage of tubo-ovarian abscess   FISTULA SUPERFICIALIZATION Left 09/14/2017   Procedure: FISTULA SUPERFICIALIZATION ARTERIOVENOUS FISTULA LEFT ARM;  Surgeon: [name], MD;  Location: [place];  Service: Vascular;  Laterality: Left;   FLEXIBLE SIGMOIDOSCOPY N/A 01/04/2019   Procedure: FLEXIBLE SIGMOIDOSCOPY;  Surgeon:  Clarene Essex, MD;  Location: [place];  Service: Endoscopy;  Laterality: N/A;   HYDRADENITIS EXCISION  02/08/2011   Procedure: EXCISION HYDRADENITIS GROIN;  Surgeon: [name], MD;  Location: [place];  Service: General;  Laterality: N/A;  Excisioin of Hidradenitis Left groin   INCISION AND DRAINAGE ABSCESS N/A 01/11/2019   Procedure: INCISION AND DRAINAGE SACRAL ABSCESS;  Surgeon: Georganna Skeans, MD;  Location: [place];  Service: General;  Laterality: N/A;   INGUINAL HIDRADENITIS EXCISION  07/06/2010   bilateral   INSERTION OF DIALYSIS CATHETER N/A 09/14/2017   Procedure: INSERTION OF TUNNELED DIALYSIS CATHETER Right Internal Jugular;  Surgeon: [name], MD;  Location: [place];  Service: Vascular;  Laterality: N/A;   IR FLUORO GUIDE CV LINE RIGHT  03/01/2019   IR US GUIDE BX ASP/DRAIN  01/01/2019   IR US GUIDE VASC ACCESS RIGHT  03/01/2019   LEFT HEART CATH AND CORONARY ANGIOGRAPHY N/A 08/05/2016   Procedure: Left Heart Cath and Coronary Angiography;  Surgeon: [name], MD;  Location: [place] CV LAB;  Service: Cardiovascular;  Laterality: N/A;   PERIPHERAL VASCULAR BALLOON ANGIOPLASTY  12/31/2021   Procedure: PERIPHERAL VASCULAR BALLOON ANGIOPLASTY;  Surgeon: Serafina Mitchell, MD;  Location: [place] CV LAB;  Service: Cardiovascular;;   PERIPHERAL VASCULAR INTERVENTION Left 12/14/2018   Procedure: PERIPHERAL VASCULAR INTERVENTION;  Surgeon: [name], MD;  Location: [place] CV LAB;  Service: Cardiovascular;  Laterality: Left;  SFA   PERIPHERAL VASCULAR INTERVENTION Left 04/25/2019   Procedure: PERIPHERAL VASCULAR INTERVENTION;  Surgeon: [name], MD;  Location: [place] CV LAB;  Service: Cardiovascular;  Laterality: Left;  right superficial femoral, and left iliac   PERIPHERAL VASCULAR INTERVENTION Left 07/18/2019   Procedure: PERIPHERAL VASCULAR INTERVENTION;  Surgeon: [name], MD;  Location: [place] CV  LAB;  Service: Cardiovascular;  Laterality: Left;  external iliac   PERIPHERAL VASCULAR INTERVENTION  12/31/2021   Procedure: PERIPHERAL VASCULAR INTERVENTION;  Surgeon: Serafina Mitchell, MD;  Location: [place] CV LAB;  Service: Cardiovascular;;   REDUCTION MAMMAPLASTY  2002   TONSILLECTOMY     TUBAL LIGATION  1996   VAGINAL HYSTERECTOMY  08/04/2005   and cysto   Social History:  reports that she quit smoking about 5 years ago. Her smoking use included cigarettes. She has never used smokeless tobacco. She reports current drug use. Frequency: 2.00 times per week. Drug: Marijuana. She reports that she does not drink alcohol.  Allergies  Allergen Reactions   Penicillin G Anaphylaxis, Itching, Swelling and Rash    unknown   Repatha [Evolocumab] Itching   Gabapentin Other (See Comments)    confusion   Zestril [Lisinopril] Cough   Family History  Problem Relation Age of Onset   Allergic rhinitis Mother    Hypertension Father    Allergic rhinitis Father    Hypertension Sister    Allergic rhinitis Sister    Hypertension Brother    Allergic rhinitis Brother    Breast cancer Cousin  x2    Prior to Admission medications   Medication Sig Start Date End Date Taking? Authorizing Provider  albuterol (VENTOLIN HFA) 108 (90 Base) MCG/ACT inhaler Inhale 1-2 puffs into the lungs every 6 (six) hours as needed for wheezing or shortness of breath. 07/02/20  Yes Mariel Aloe, MD  cyclobenzaprine (FLEXERIL) 10 MG tablet Take 1 tablet (10 mg total) by mouth 2 (two) times daily as needed. 08/06/20  Yes Newlin, Enobong, MD  fluticasone furoate-vilanterol (BREO ELLIPTA) 200-25 MCG/ACT AEPB Inhale 1 puff into the lungs daily. 11/05/21  Yes Argentina Donovan, PA-C  HYDROcodone-acetaminophen (NORCO/VICODIN) 5-325 MG tablet Take 1 tablet by mouth every 4 (four) hours as needed. 10/09/21  Yes [provider]  ipratropium-albuterol (DUONEB) 0.5-2.5 (3) MG/3ML SOLN Take 3 mLs by nebulization  every 6 (six) hours as needed. 07/02/20  Yes Mariel Aloe, MD  montelukast (SINGULAIR) 10 MG tablet Take 1 tablet (10 mg total) by mouth at bedtime. 11/05/21  Yes McClung, Dionne Bucy, PA-C  oxyCODONE 10 MG TABS Take 1-1.5 tablets (10-15 mg total) by mouth every 4 (four) hours as needed for moderate pain or severe pain. 11/23/21  Yes Patrecia Pour, MD  promethazine (PHENERGAN) 25 MG tablet Take 1 tablet (25 mg total) by mouth every 8 (eight) hours as needed for nausea or vomiting. 07/30/21  Yes Charlott Rakes, MD  aspirin 81 MG chewable tablet Chew 1 tablet (81 mg total) by mouth daily. Patient not taking: Reported on 12/16/2021 08/07/16   Reino Bellis B, NP  atorvastatin (LIPITOR) 40 MG tablet TAKE 1 TABLET BY MOUTH EVERY DAY Patient not taking: Reported on 12/16/2021 12/22/20   [name], MD  B Complex-C-Zn-Folic Acid (DIALYVITE/ZINC) TABS Take 1 tablet by mouth daily. Patient not taking: Reported on 12/16/2021 03/25/20   [provider]  calcitRIOL (ROCALTROL) 0.5 MCG capsule Take 1.5 mcg by mouth daily. Patient not taking: Reported on 12/16/2021 09/11/18   [provider]  clopidogrel (PLAVIX) 75 MG tablet TAKE 1 TABLET (75 MG TOTAL) BY MOUTH DAILY WITH BREAKFAST. Patient not taking: Reported on 12/16/2021 07/28/20   [name], MD  Difelikefalin Acetate [facility]) 65 MCG/1.3ML SOLN [place] (difelikefalin) IVP Maintenance Dose Patient not taking: Reported on 12/16/2021 08/13/21 08/12/22  [provider]  docusate sodium (COLACE) 100 MG capsule Take 100 mg by mouth 2 (two) times daily. Patient not taking: Reported on 12/16/2021 10/09/21   [provider]  iron sucrose (VENOFER) 20 MG/ML injection Iron Sucrose (Venofer) Patient not taking: Reported on 11/08/2021 07/11/19   [provider]  Methoxy PEG-Epoetin Beta (MIRCERA IJ) Inject into the skin. Patient not taking: Reported on 12/16/2021 07/09/19   [provider]  metoCLOPramide (REGLAN)  10 MG tablet Take 1 tablet (10 mg total) by mouth 3 (three) times daily before meals. 11/23/21   Patrecia Pour, MD  metoprolol succinate (TOPROL-XL) 25 MG 24 hr tablet Take 1 tablet (25 mg total) by mouth daily. TAKE 1.5 TABLETS BY MOUTH DAILY 11/23/21   Patrecia Pour, MD  Misc. Devices MISC Electric scooter for mobility 06/24/21   Charlott Rakes, MD  nitroGLYCERIN (NITROSTAT) 0.4 MG SL tablet Place 1 tablet (0.4 mg total) under the tongue every 5 (five) minutes as needed for chest pain. 08/06/19   [name], MD  PRALUENT 75 MG/ML SOAJ INJECT 75 MG INTO THE SKIN EVERY 14 (FOURTEEN) DAYS. Patient not taking: Reported on 12/16/2021 02/07/20   [name], MD   Physical Exam:  Blood pressure (!) 99/41, pulse (!) 112, temperature (!) 97.4 F (36.3 C), temperature source Oral, resp. rate 18, height '5\' 5"'$  (1.651 m), weight 56.7 kg, SpO2 100 %. Vitals:   12/16/21 1900 12/16/21 2030  BP: (!) 83/49 (!) 99/41  Pulse: (!) 112   Resp: 16 18  Temp:  (!) 97.4 F (36.3 C)  SpO2: 100%     1. General:  in No Acute distress  Chronically ill   -appearing 2. Psychological:    Oriented to self diminished mental acuity 3. Head/ENT:    Dry Mucous Membranes                          Head Non traumatic, neck supple                           Poor Dentition 4. SKIN:  decreased Skin turgor,  Skin clean Dry and blistering over right foot, dressing over surgical incision 5. Heart: Regular rate and rhythm no  Murmur, no Rub or gallop 6. Lungs:  no wheezes or crackles   7. Abdomen: Soft,  non-tender, Non distended  bowel sounds present 8. Lower extremities: no clubbing,  or  edema, cool lower extremities 9. Neurologically Grossly intact, moving all 4 extremities equally   10. MSK: Normal range of motion  Labs on Admission:  Basic Metabolic Panel: Recent Labs  Lab 12/14/21 1512 12/16/21 0217  NA 133* 134*  K 4.0 3.8  CL 95* 93*  CO2 23 18*  GLUCOSE 77 102*  BUN 44* 36*  CREATININE 5.96* 5.15*   CALCIUM 7.1* 7.8*  PHOS 6.6* 6.7*   Liver Function Tests: Recent Labs  Lab 12/14/21 1512 12/16/21 0217  ALBUMIN <1.5* 1.6*   No results for input(s): "LIPASE", "AMYLASE" in the last 168 hours. No results for input(s): "AMMONIA" in the last 168 hours. CBC: Recent Labs  Lab 12/14/21 1512 12/16/21 0217  WBC 17.4* 19.0*  NEUTROABS 14.9*  --   HGB 7.2* 7.9*  HCT 25.0* 27.6*  MCV 78.1* 78.0*  PLT 463* 544*   Cardiac Enzymes: No results for input(s): "CKTOTAL", "CKMB", "CKMBINDEX", "TROPONINI" in the last 168 hours. BNP: Invalid input(s): "POCBNP" CBG: Recent Labs  Lab 12/16/21 0737 12/16/21 1111 12/16/21 1736 12/16/21 1758 12/16/21 2033  GLUCAP 109* 127* 55* 98 92    Radiological Exams on Admission: PERIPHERAL VASCULAR CATHETERIZATION  Result Date: 12/16/2021 Images from the original result were not included. Patient name: KIMBRIA CAMPOSANO MRN: [id number] DOB: [birth year] Sex: female 12/24/2021 Pre-operative Diagnosis: Left foot ischemia Post-operative diagnosis:  Same Surgeon:  [name] Procedure Performed:  1.  Ultrasound-guided access, right femoral artery  2.  Abdominal aortogram  3.  Bilateral lower extremity runoff  4.  Angioplasty, left posterior tibial artery  5.  Stent, right external iliac artery  6.  Conscious sedation, 67 minutes  Indications: This is a 51 year old female with end-stage renal disease who has undergone bilateral lower extremity revascularizations in the past.  She has ischemic changes to her left toes.  She comes in today for angiographic evaluation. Procedure:  The patient was identified in the holding area and taken to room 8.  The patient was then placed supine on the table and prepped and draped in the usual sterile fashion.  A time out was called.  Conscious sedation was administered with the use of IV fentanyl and Versed under continuous physician and nurse monitoring.  Heart  rate, blood pressure, and oxygen saturation were continuously monitored.   Total sedation time was 67 minutes.  Ultrasound was used to evaluate the right common femoral artery.  It was patent .  A digital ultrasound image was acquired.  A micropuncture needle was used to access the right common femoral artery under ultrasound guidance.  An 018 wire was advanced without resistance and a micropuncture sheath was placed.  The 018 wire was removed and a benson wire was placed.  The micropuncture sheath was exchanged for a 5 french sheath.  An omniflush catheter was advanced over the wire to the level of L-1.  An abdominal angiogram was obtained.  Followed by bilateral runoff Findings:  Aortogram: Heavily calcified infrarenal abdominal aorta without hemodynamically significant stenosis.  Bilateral renal arteries are atretic.  Bilateral common iliac arteries and associated stents are widely patent.  The left external iliac artery and its stent are patent with less than 50% stenosis in the midportion.  There is a greater than 70% stenosis within the right external iliac artery.  Right Lower Extremity: Heavily calcified right common femoral artery.  The profundofemoral artery is calcified but patent.  There is limited evaluation of the proximal superficial femoral artery secondary to sheath obstruction.  The stents within the superficial femoral artery do appear to be patent.  There is a 50% stenosis within the mid superficial femoral artery.  The popliteal artery is widely patent.  The posterior tibial artery appears to be the dominant runoff.  Distal vessel evaluation was limited secondary to contrast timing.  She has diffuse disease on the foot.  Left Lower Extremity: The left common femoral profundofemoral artery are patent without significant stenosis.  The superficial femoral artery and its stents are widely patent.  The vessels are small in caliber.  There appears to be a high-grade lesion at the origin of the posterior tibial artery which appears to be the dominant runoff.  There is  diffuse disease out on the foot Intervention: After the above images were acquired the decision was made to proceed with intervention.  A 6 French 45 cm sheath was advanced into the left external iliac artery.  The patient was fully heparinized.  I then used a 035 quick cross catheter and a V-18 wire to gain access into the posterior tibial artery.  I then used a 2.5 x 100 Sterling balloon to perform balloon angioplasty of the posterior tibial artery.  Completion imaging showed a patent proximal posterior tibial artery without stenosis.  The patient was not tolerating the procedure well at this time and was begging for me to stop.  We were limited on sedation because of her hypotension.  Therefore I did not persist with left leg additional intervention.  I did feel that her right external iliac artery needed to be stented and so this was done with a 7 x 80 Elluvia stent and postdilated with a 6 mm balloon with no residual stenosis.  I exchanged out the long sheath for short 6 French sheath.  The patient taken holding it for sheath pull once her coagulation profile corrects Impression:  #1  Successful stenting of a approximate 70% right external iliac artery stenosis  #2  Successful stenting of a greater than 95% proximal posterior tibial artery stenosis with a 2.5 mm balloon.  The patient has inline flow through her posterior tibial artery across the ankle with diffuse microvascular disease on the left  V. [name], M.D., FACS [facility] Office:  [phone] Pager:  [phone]    EKG: Independently reviewed.    Time spent: 90 min  CRITICAL CARE Performed by: Breanah Faddis   Total critical care time: 90 minutes  Critical care time was exclusive of separately billable procedures and treating other patients.  Critical care was necessary to treat or prevent imminent or life-threatening deterioration.  Critical care was time spent personally by me on the  following activities: development of treatment plan with patient and/or surrogate as well as nursing, discussions with consultants, evaluation of patient's response to treatment, examination of patient, obtaining history from patient or surrogate, ordering and performing treatments and interventions, ordering and review of laboratory studies, ordering and review of radiographic studies, pulse oximetry and re-evaluation of patient's condition.   Toy Baker [facility] Pager [phone]  If 7PM-7AM, please contact night-coverage [email or web address] Password [email or web address] 12/16/2021, 10:11 PM

## 2021-12-16 NOTE — Assessment & Plan Note (Addendum)
Nephrology is following recheck labs as pt did not received her HD tonight

## 2021-12-16 NOTE — Progress Notes (Addendum)
Inpatient Rehabilitation Admissions Coordinator   I met at bedside with patient.Sister, Leandro Reasoner and Mom arrived during my assessment. Patient very sleepy and states she has had pain meds during the night. She is unable to carry on conversation but nods yes and no and able to verify who is at bedside. Leandro Reasoner states she is requesting vascular service to discuss with family and patient next plans steps post procedure before proceeding with readmit to CIR to complete her Rehab. Patient has been limited with her participation with therapy recently due to her pain, but has continued as able. Dr Ranell Patrick is in agreement to readmit to CIR when appropriate, likely postop. Feel medical/surgical plan needs to be discussed and outlined with patient and family. Family education with her sister was planned for 12/8 with expected discharge planned for 12/9. Today is her hemodialysis day and recommendation for Renal consult to order as needed. Acute team and TOC made aware of discussions. Readmit to CIR is on hold at this time.  Danne Baxter, RN, MSN Rehab Admissions Coordinator [phone] 12/16/2021 10:37 AM

## 2021-12-16 NOTE — Assessment & Plan Note (Signed)
Appears dry will gently rehydrate

## 2021-12-16 NOTE — Progress Notes (Signed)
   12/16/21 0744  Assess: MEWS Score  Temp 97.8 F (36.6 C)  BP (!) 97/51  MAP (mmHg) (!) 63  Pulse Rate (!) 107  ECG Heart Rate (!) 107  Resp 17  Level of Consciousness Alert  SpO2 99 %  O2 Device Room Air  Assess: MEWS Score  MEWS Temp 0  MEWS Systolic 1  MEWS Pulse 1  MEWS RR 0  MEWS LOC 0  MEWS Score 2  MEWS Score Color Yellow  Assess: if the MEWS score is Yellow or Red  Were vital signs taken at a resting state? Yes (vascular team at bedside evaluating right leg)  Focused Assessment No change from prior assessment  Does the patient meet 2 or more of the SIRS criteria? No  MEWS guidelines implemented *See Row Information* Yes  Treat  Pain Scale 0-10  Pain Score 0 (awoke from sleeping)  Take Vital Signs  Increase Vital Sign Frequency  Yellow: Q 2hr X 2 then Q 4hr X 2, if remains yellow, continue Q 4hrs  Escalate  MEWS: Escalate Yellow: discuss with charge nurse/RN and consider discussing with provider and RRT  Notify: Charge Nurse/RN  Name of Charge Nurse/RN Notified Haematologist  Date Charge Nurse/RN Notified 12/16/21  Time Charge Nurse/RN Notified [phone]  Provider Notification  Provider Name/Title Dr. Trula Slade  Date Provider Notified 12/16/21  Time Provider Notified [phone]  Method of Notification Call  Notification Reason Other (Comment) (yellow MEWS)  Provider response In department (team in department)  Date of Provider Response 12/16/21  Time of Provider Response 0751  Notify: Rapid Response  Name of Rapid Response RN Notified Not applicable  Document  Patient Outcome Other (Comment) (Patient is stable, no change in status at this time.)  Progress note created (see row info) Yes  Assess: SIRS CRITERIA  SIRS Temperature  0  SIRS Pulse 1  SIRS Respirations  0  SIRS WBC 1  SIRS Score Sum  2

## 2021-12-16 NOTE — Progress Notes (Signed)
   12/16/21 1749  Assess: MEWS Score  Temp 98 F (36.7 C)  BP (!) 76/43  MAP (mmHg) (!) 55  Pulse Rate (!) 118  ECG Heart Rate (!) 116  Resp 18  Level of Consciousness Alert  SpO2 100 %  O2 Device Nasal Cannula  Patient Activity (if Appropriate) In bed  O2 Flow Rate (L/min) 4 L/min  Assess: MEWS Score  MEWS Temp 0  MEWS Systolic 2  MEWS Pulse 2  MEWS RR 0  MEWS LOC 0  MEWS Score 4  MEWS Score Color Red  Assess: if the MEWS score is Yellow or Red  Were vital signs taken at a resting state? Yes  Focused Assessment Change from prior assessment (see assessment flowsheet)  Does the patient meet 2 or more of the SIRS criteria? Yes  Does the patient have a confirmed or suspected source of infection? Yes  Provider and Rapid Response Notified? Yes  MEWS guidelines implemented *See Row Information* Yes  Treat  MEWS Interventions Other (Comment) (Dr. Trula Slade made aware)  Pain Scale 0-10  Pain Score 0  Take Vital Signs  Increase Vital Sign Frequency  Red: Q 1hr X 4 then Q 4hr X 4, if remains red, continue Q 4hrs  Escalate  MEWS: Escalate Red: discuss with charge nurse/RN and provider, consider discussing with RRT  Notify: Charge Nurse/RN  Date Charge Nurse/RN Notified 12/16/21  Time Charge Nurse/RN Notified 1735  Provider Notification  Provider Name/Title Dr. Trula Slade  Date Provider Notified 12/16/21  Time Provider Notified 1735  Method of Notification Call  Notification Reason Change in status  Provider response Evaluate remotely  Date of Provider Response 12/16/21  Time of Provider Response 1840  Notify: Rapid Response  Name of Rapid Response RN Notified [facility] RN  Date Rapid Response Notified 12/16/21  Time Rapid Response Notified 3754  Document  Patient Outcome Stabilized after interventions  Progress note created (see row info) Yes  Assess: SIRS CRITERIA  SIRS Temperature  0  SIRS Pulse 1  SIRS Respirations  0  SIRS WBC 1  SIRS Score Sum  2

## 2021-12-16 NOTE — Consult Note (Signed)
   [facility] CM Inpatient Consult   12/16/2021  LATIESHA HARADA 01-17-1970 [id number]  [employer] [ACO] Patient: Medicare ACO REACH  Primary Care Provider:  Charlott Rakes, MD with [facility]  Patient screened for less than 30 days readmission hospitalization with noted high risk score for unplanned readmission risk and to assess for potential [place] Management service needs for post hospital transition for care coordination.  Review of patient's electronic medical record reveals patient is admitted from [facility] unit. Patient resting on unit rounds.  Plan:  Continue to follow progress and disposition to assess for post hospital community care coordination/management needs.  Referral request for community care coordination: disposition not currently finalized  Of note, [place] does not replace or interfere with any arrangements made by the Inpatient Transition of Care team.  For questions contact:   Natividad Brood, RN BSN [place]  [phone] business mobile phone Toll free office [phone]  *[place]  [phone] Fax number: [phone] [email or web address]

## 2021-12-16 NOTE — Progress Notes (Signed)
Pt A&O x 4 throughout shift w/ delayed responses, demonstrating flat affect & seems withdrawn. Pt refused PO intake overnight including dinner & offer to place breakfast order. Only PO intake was a couple of sips w/ her PRN tylenol last night. ACHS CBGs & dietician consult ordered.

## 2021-12-16 NOTE — Significant Event (Addendum)
Rapid Response Event Note   Reason for Call :  Patient returned from HD, which was not completed, d/t hypotension. Pt also lethargic. CBG 55. Tachycardia- HR up to 140s, nonsustained.   Initial Focused Assessment:  Pt lying in bed, lethargic. Eyes open, tracking staff in room. Responses are delayed. Oriented x2- person & time. Pt did not answer additional orientation questions. No distress. Skin is warm, dry, pink. Lung sounds are clear. Heart rate irregular.   VS: T 15F, BP 102/58, HR 118, RR 16, SpO2 98% on 2LNC Repeat CBG: 98  Interventions:  -No IV access- Glucagon given IM -IV team re-consulted for another attempt at IV access- IV access established  Plan of Care:  Mentation improving following Glucagon administration. Pt responses remain delayed. HR 110s. BP trending 102/58, 108/91.   -Frequent monitoring of VS -Maintain IV access -Recheck CBG at 1900 -Encourage oral intake   -Vascular team to consult medicine  Call rapid response for additional needs  Event Summary:  MD Notified: Dr. Trula Slade notified by primary RN Call Time: 6808 Arrival Time: 8110 End Time: [place], RN

## 2021-12-16 NOTE — Assessment & Plan Note (Signed)
Hold home meds

## 2021-12-16 NOTE — Assessment & Plan Note (Signed)
In the setting of dehydration, hypotentinon, low BG

## 2021-12-17 ENCOUNTER — Inpatient Hospital Stay (HOSPITAL_COMMUNITY): Payer: Medicare Other

## 2021-12-17 DIAGNOSIS — I739 Peripheral vascular disease, unspecified: Secondary | ICD-10-CM | POA: Diagnosis not present

## 2021-12-17 DIAGNOSIS — I998 Other disorder of circulatory system: Secondary | ICD-10-CM

## 2021-12-17 DIAGNOSIS — I700 Atherosclerosis of aorta: Secondary | ICD-10-CM | POA: Diagnosis not present

## 2021-12-17 DIAGNOSIS — R579 Shock, unspecified: Secondary | ICD-10-CM | POA: Diagnosis not present

## 2021-12-17 DIAGNOSIS — J9601 Acute respiratory failure with hypoxia: Secondary | ICD-10-CM

## 2021-12-17 DIAGNOSIS — J9811 Atelectasis: Secondary | ICD-10-CM | POA: Diagnosis not present

## 2021-12-17 DIAGNOSIS — Z4682 Encounter for fitting and adjustment of non-vascular catheter: Secondary | ICD-10-CM | POA: Diagnosis not present

## 2021-12-17 DIAGNOSIS — R0902 Hypoxemia: Secondary | ICD-10-CM | POA: Diagnosis not present

## 2021-12-17 DIAGNOSIS — N179 Acute kidney failure, unspecified: Secondary | ICD-10-CM | POA: Diagnosis not present

## 2021-12-17 LAB — BLOOD CULTURE ID PANEL (REFLEXED) - BCID2
A.calcoaceticus-baumannii: NOT DETECTED
Bacteroides fragilis: NOT DETECTED
Candida albicans: NOT DETECTED
Candida auris: NOT DETECTED
Candida glabrata: NOT DETECTED
Candida krusei: NOT DETECTED
Candida parapsilosis: NOT DETECTED
Candida tropicalis: NOT DETECTED
Cryptococcus neoformans/gattii: NOT DETECTED
Enterobacter cloacae complex: NOT DETECTED
Enterobacterales: NOT DETECTED
Enterococcus Faecium: NOT DETECTED
Enterococcus faecalis: NOT DETECTED
Escherichia coli: NOT DETECTED
Haemophilus influenzae: NOT DETECTED
Klebsiella aerogenes: NOT DETECTED
Klebsiella oxytoca: NOT DETECTED
Klebsiella pneumoniae: NOT DETECTED
Listeria monocytogenes: NOT DETECTED
Meth resistant mecA/C and MREJ: NOT DETECTED
Neisseria meningitidis: NOT DETECTED
Proteus species: NOT DETECTED
Pseudomonas aeruginosa: NOT DETECTED
Salmonella species: NOT DETECTED
Serratia marcescens: NOT DETECTED
Staphylococcus aureus (BCID): DETECTED — AB
Staphylococcus epidermidis: NOT DETECTED
Staphylococcus lugdunensis: NOT DETECTED
Staphylococcus species: DETECTED — AB
Stenotrophomonas maltophilia: NOT DETECTED
Streptococcus agalactiae: NOT DETECTED
Streptococcus pneumoniae: NOT DETECTED
Streptococcus pyogenes: NOT DETECTED
Streptococcus species: DETECTED — AB

## 2021-12-17 LAB — PROCALCITONIN: Procalcitonin: 8.79 ng/mL

## 2021-12-17 LAB — COMPREHENSIVE METABOLIC PANEL
ALT: 142 U/L — ABNORMAL HIGH (ref 0–44)
AST: 1184 U/L — ABNORMAL HIGH (ref 15–41)
Albumin: 1.7 g/dL — ABNORMAL LOW (ref 3.5–5.0)
Alkaline Phosphatase: 274 U/L — ABNORMAL HIGH (ref 38–126)
Anion gap: 32 — ABNORMAL HIGH (ref 5–15)
BUN: 33 mg/dL — ABNORMAL HIGH (ref 6–20)
CO2: 11 mmol/L — ABNORMAL LOW (ref 22–32)
Calcium: 8.2 mg/dL — ABNORMAL LOW (ref 8.9–10.3)
Chloride: 94 mmol/L — ABNORMAL LOW (ref 98–111)
Creatinine, Ser: 4.45 mg/dL — ABNORMAL HIGH (ref 0.44–1.00)
GFR, Estimated: 11 mL/min — ABNORMAL LOW (ref >60)
Glucose, Bld: 261 mg/dL — ABNORMAL HIGH (ref 70–99)
Potassium: 4.4 mmol/L (ref 3.5–5.1)
Sodium: 137 mmol/L (ref 135–145)
Total Bilirubin: 2.2 mg/dL — ABNORMAL HIGH (ref 0.3–1.2)
Total Protein: 5.6 g/dL — ABNORMAL LOW (ref 6.5–8.1)

## 2021-12-17 LAB — POCT I-STAT 7, (LYTES, BLD GAS, ICA,H+H)
Acid-base deficit: 22 mmol/L — ABNORMAL HIGH (ref 0.0–2.0)
Acid-base deficit: 14 mmol/L — ABNORMAL HIGH (ref 0.0–2.0)
Acid-base deficit: 23 mmol/L — ABNORMAL HIGH (ref 0.0–2.0)
Bicarbonate: 7.1 mmol/L — ABNORMAL LOW (ref 20.0–28.0)
Bicarbonate: 12.2 mmol/L — ABNORMAL LOW (ref 20.0–28.0)
Bicarbonate: 5.6 mmol/L — ABNORMAL LOW (ref 20.0–28.0)
Calcium, Ion: 1.06 mmol/L — ABNORMAL LOW (ref 1.15–1.40)
Calcium, Ion: 1 mmol/L — ABNORMAL LOW (ref 1.15–1.40)
Calcium, Ion: 0.95 mmol/L — ABNORMAL LOW (ref 1.15–1.40)
HCT: 22 % — ABNORMAL LOW (ref 36.0–46.0)
HCT: 24 % — ABNORMAL LOW (ref 36.0–46.0)
HCT: 23 % — ABNORMAL LOW (ref 36.0–46.0)
Hemoglobin: 7.5 g/dL — ABNORMAL LOW (ref 12.0–15.0)
Hemoglobin: 8.2 g/dL — ABNORMAL LOW (ref 12.0–15.0)
Hemoglobin: 7.8 g/dL — ABNORMAL LOW (ref 12.0–15.0)
O2 Saturation: 99 %
O2 Saturation: 100 %
O2 Saturation: 98 %
Patient temperature: 97.4
Patient temperature: 97.8
Potassium: 5.6 mmol/L — ABNORMAL HIGH (ref 3.5–5.1)
Potassium: 4.6 mmol/L (ref 3.5–5.1)
Potassium: 5.8 mmol/L — ABNORMAL HIGH (ref 3.5–5.1)
Sodium: 131 mmol/L — ABNORMAL LOW (ref 135–145)
Sodium: 134 mmol/L — ABNORMAL LOW (ref 135–145)
Sodium: 134 mmol/L — ABNORMAL LOW (ref 135–145)
TCO2: 8 mmol/L — ABNORMAL LOW (ref 22–32)
TCO2: 13 mmol/L — ABNORMAL LOW (ref 22–32)
TCO2: 6 mmol/L — ABNORMAL LOW (ref 22–32)
pCO2 arterial: 25.1 mmHg — ABNORMAL LOW (ref 32–48)
pCO2 arterial: 28.5 mmHg — ABNORMAL LOW (ref 32–48)
pCO2 arterial: 19.7 mmHg — CL (ref 32–48)
pH, Arterial: 7.056 — CL (ref 7.35–7.45)
pH, Arterial: 7.239 — ABNORMAL LOW (ref 7.35–7.45)
pH, Arterial: 7.065 — CL (ref 7.35–7.45)
pO2, Arterial: 186 mmHg — ABNORMAL HIGH (ref 83–108)
pO2, Arterial: 416 mmHg — ABNORMAL HIGH (ref 83–108)
pO2, Arterial: 136 mmHg — ABNORMAL HIGH (ref 83–108)

## 2021-12-17 LAB — CBC WITH DIFFERENTIAL/PLATELET
Abs Immature Granulocytes: 0 10*3/uL (ref 0.00–0.07)
Basophils Absolute: 0 10*3/uL (ref 0.0–0.1)
Basophils Relative: 0 %
Eosinophils Absolute: 0 10*3/uL (ref 0.0–0.5)
Eosinophils Relative: 0 %
HCT: 24.2 % — ABNORMAL LOW (ref 36.0–46.0)
Hemoglobin: 6.6 g/dL — CL (ref 12.0–15.0)
Lymphocytes Relative: 2 %
Lymphs Abs: 0.7 10*3/uL (ref 0.7–4.0)
MCH: 22.4 pg — ABNORMAL LOW (ref 26.0–34.0)
MCHC: 27.3 g/dL — ABNORMAL LOW (ref 30.0–36.0)
MCV: 82 fL (ref 80.0–100.0)
Monocytes Absolute: 0.4 10*3/uL (ref 0.1–1.0)
Monocytes Relative: 1 %
Neutro Abs: 34.4 10*3/uL — ABNORMAL HIGH (ref 1.7–7.7)
Neutrophils Relative %: 97 %
Platelets: 448 10*3/uL — ABNORMAL HIGH (ref 150–400)
RBC: 2.95 MIL/uL — ABNORMAL LOW (ref 3.87–5.11)
RDW: 19.6 % — ABNORMAL HIGH (ref 11.5–15.5)
WBC: 35.5 10*3/uL — ABNORMAL HIGH (ref 4.0–10.5)
nRBC: 0.7 % — ABNORMAL HIGH (ref 0.0–0.2)
nRBC: 1 /100 WBC — ABNORMAL HIGH

## 2021-12-17 LAB — GLUCOSE, CAPILLARY
Glucose-Capillary: 217 mg/dL — ABNORMAL HIGH (ref 70–99)
Glucose-Capillary: 255 mg/dL — ABNORMAL HIGH (ref 70–99)
Glucose-Capillary: 58 mg/dL — ABNORMAL LOW (ref 70–99)
Glucose-Capillary: 69 mg/dL — ABNORMAL LOW (ref 70–99)
Glucose-Capillary: 80 mg/dL (ref 70–99)

## 2021-12-17 LAB — MRSA NEXT GEN BY PCR, NASAL: MRSA by PCR Next Gen: NOT DETECTED

## 2021-12-17 LAB — TSH: TSH: 12.8 u[IU]/mL — ABNORMAL HIGH (ref 0.350–4.500)

## 2021-12-17 LAB — PROTIME-INR
INR: 2.7 — ABNORMAL HIGH (ref 0.8–1.2)
Prothrombin Time: 28.2 seconds — ABNORMAL HIGH (ref 11.4–15.2)

## 2021-12-17 LAB — AMMONIA: Ammonia: 113 umol/L — ABNORMAL HIGH (ref 9–35)

## 2021-12-17 LAB — PREPARE RBC (CROSSMATCH)

## 2021-12-17 LAB — CK: Total CK: 351 U/L — ABNORMAL HIGH (ref 38–234)

## 2021-12-17 LAB — POCT ACTIVATED CLOTTING TIME: Activated Clotting Time: 168 seconds

## 2021-12-17 LAB — BETA-HYDROXYBUTYRIC ACID: Beta-Hydroxybutyric Acid: 3.23 mmol/L — ABNORMAL HIGH (ref 0.05–0.27)

## 2021-12-17 LAB — TROPONIN I (HIGH SENSITIVITY): Troponin I (High Sensitivity): 56 ng/L — ABNORMAL HIGH (ref <18)

## 2021-12-17 LAB — LACTIC ACID, PLASMA
Lactic Acid, Venous: >9 mmol/L (ref 0.5–1.9)
Lactic Acid, Venous: >9 mmol/L (ref 0.5–1.9)

## 2021-12-17 LAB — MAGNESIUM: Magnesium: 2.2 mg/dL (ref 1.7–2.4)

## 2021-12-17 LAB — PHOSPHORUS: Phosphorus: 9.1 mg/dL — ABNORMAL HIGH (ref 2.5–4.6)

## 2021-12-17 LAB — APTT: aPTT: 62 seconds — ABNORMAL HIGH (ref 24–36)

## 2021-12-17 MED ORDER — SODIUM CHLORIDE 0.9% IV SOLUTION: Freq: Once | INTRAVENOUS | Status: DC

## 2021-12-17 MED ORDER — NOREPINEPHRINE 4 MG/250ML-% IV SOLN
INTRAVENOUS | Status: AC
  Filled 2021-12-17: qty 250

## 2021-12-17 MED ORDER — FENTANYL CITRATE PF 50 MCG/ML IJ SOSY
PREFILLED_SYRINGE | INTRAMUSCULAR | Status: AC
  Filled 2021-12-17: qty 2

## 2021-12-17 MED ORDER — CLOPIDOGREL BISULFATE 75 MG PO TABS: 75.0000 mg | ORAL_TABLET | Freq: Every day | ORAL | Status: DC

## 2021-12-17 MED ORDER — NOREPINEPHRINE 4 MG/250ML-% IV SOLN: 2.0000 ug/min | INTRAVENOUS | Status: DC

## 2021-12-17 MED ORDER — ETOMIDATE 2 MG/ML IV SOLN
INTRAVENOUS | Status: AC
  Administered 2021-12-17: 20 mg
  Filled 2021-12-17: qty 10

## 2021-12-17 MED ORDER — SODIUM BICARBONATE 8.4 % IV SOLN: 200.0000 meq | Freq: Once | INTRAVENOUS | Status: DC

## 2021-12-17 MED ORDER — CALCIUM GLUCONATE-NACL 2-0.675 GM/100ML-% IV SOLN
2.0000 g | Freq: Once | INTRAVENOUS | Status: AC
  Administered 2021-12-17: 2000 mg via INTRAVENOUS
  Filled 2021-12-17: qty 100

## 2021-12-17 MED ORDER — AMIODARONE HCL IN DEXTROSE 360-4.14 MG/200ML-% IV SOLN
60.0000 mg/h | INTRAVENOUS | Status: AC
  Administered 2021-12-17 (×2): 60 mg/h via INTRAVENOUS
  Filled 2021-12-17 (×2): qty 200

## 2021-12-17 MED ORDER — VANCOMYCIN HCL 1250 MG/250ML IV SOLN
1250.0000 mg | Freq: Once | INTRAVENOUS | Status: DC
  Filled 2021-12-17: qty 250

## 2021-12-17 MED ORDER — FENTANYL BOLUS VIA INFUSION: 50.0000 ug | INTRAVENOUS | Status: DC | PRN

## 2021-12-17 MED ORDER — SUCCINYLCHOLINE CHLORIDE 200 MG/10ML IV SOSY
PREFILLED_SYRINGE | INTRAVENOUS | Status: AC
  Filled 2021-12-17: qty 10

## 2021-12-17 MED ORDER — SODIUM CHLORIDE 0.9 % IV SOLN: 1.0000 g | INTRAVENOUS | Status: DC

## 2021-12-17 MED ORDER — CHLORHEXIDINE GLUCONATE CLOTH 2 % EX PADS: 6.0000 | MEDICATED_PAD | Freq: Every day | CUTANEOUS | Status: DC

## 2021-12-17 MED ORDER — FENTANYL CITRATE PF 50 MCG/ML IJ SOSY: 50.0000 ug | PREFILLED_SYRINGE | Freq: Once | INTRAMUSCULAR | Status: DC

## 2021-12-17 MED ORDER — ASPIRIN 81 MG PO CHEW: 81.0000 mg | CHEWABLE_TABLET | Freq: Every day | ORAL | Status: DC

## 2021-12-17 MED ORDER — SODIUM CHLORIDE 0.9 % IV SOLN: 250.0000 mL | INTRAVENOUS | Status: DC

## 2021-12-17 MED ORDER — PHENYLEPHRINE 80 MCG/ML (10ML) SYRINGE FOR IV PUSH (FOR BLOOD PRESSURE SUPPORT)
PREFILLED_SYRINGE | INTRAVENOUS | Status: AC
  Filled 2021-12-17: qty 10

## 2021-12-17 MED ORDER — DEXTROSE 50 % IV SOLN
12.5000 g | INTRAVENOUS | Status: AC
  Administered 2021-12-17: 12.5 g via INTRAVENOUS

## 2021-12-17 MED ORDER — POLYETHYLENE GLYCOL 3350 17 G PO PACK: 17.0000 g | PACK | Freq: Every day | ORAL | Status: DC

## 2021-12-17 MED ORDER — DEXMEDETOMIDINE HCL IN NACL 400 MCG/100ML IV SOLN: 0.0000 ug/kg/h | INTRAVENOUS | Status: DC

## 2021-12-17 MED ORDER — NOREPINEPHRINE 16 MG/250ML-% IV SOLN
0.0000 ug/min | INTRAVENOUS | Status: DC
  Administered 2021-12-17: 60 ug/min via INTRAVENOUS
  Administered 2021-12-17 (×2): 20 ug/min via INTRAVENOUS
  Administered 2021-12-17: 60 ug/min via INTRAVENOUS
  Filled 2021-12-17 (×3): qty 250

## 2021-12-17 MED ORDER — ROCURONIUM BROMIDE 10 MG/ML (PF) SYRINGE
PREFILLED_SYRINGE | INTRAVENOUS | Status: AC
  Administered 2021-12-17: 50 mg
  Filled 2021-12-17: qty 10

## 2021-12-17 MED ORDER — SODIUM BICARBONATE 8.4 % IV SOLN
INTRAVENOUS | Status: DC
  Filled 2021-12-17 (×2): qty 1000

## 2021-12-17 MED ORDER — HEPARIN (PORCINE) 25000 UT/250ML-% IV SOLN
800.0000 [IU]/h | INTRAVENOUS | Status: DC
  Administered 2021-12-17: 800 [IU]/h via INTRAVENOUS
  Filled 2021-12-17: qty 250

## 2021-12-17 MED ORDER — SODIUM BICARBONATE 8.4 % IV SOLN
100.0000 meq | Freq: Once | INTRAVENOUS | Status: DC
  Filled 2021-12-17: qty 100

## 2021-12-17 MED ORDER — DEXMEDETOMIDINE HCL IN NACL 400 MCG/100ML IV SOLN
INTRAVENOUS | Status: AC
  Filled 2021-12-17: qty 100

## 2021-12-17 MED ORDER — AMIODARONE LOAD VIA INFUSION
150.0000 mg | Freq: Once | INTRAVENOUS | Status: AC
  Administered 2021-12-17: 150 mg via INTRAVENOUS
  Filled 2021-12-17: qty 83.34

## 2021-12-17 MED ORDER — MIDAZOLAM HCL 2 MG/2ML IJ SOLN
INTRAMUSCULAR | Status: AC
  Filled 2021-12-17: qty 2

## 2021-12-17 MED ORDER — AMIODARONE HCL IN DEXTROSE 360-4.14 MG/200ML-% IV SOLN: 30.0000 mg/h | INTRAVENOUS | Status: DC

## 2021-12-17 MED ORDER — VASOPRESSIN 20 UNITS/100 ML INFUSION FOR SHOCK
0.0000 [IU]/min | INTRAVENOUS | Status: DC
  Administered 2021-12-17: 0.03 [IU]/min via INTRAVENOUS
  Filled 2021-12-17: qty 100

## 2021-12-17 MED ORDER — PANTOPRAZOLE SODIUM 40 MG IV SOLR: 40.0000 mg | Freq: Every day | INTRAVENOUS | Status: DC

## 2021-12-17 MED ORDER — SODIUM CHLORIDE 0.9 % IV BOLUS
500.0000 mL | Freq: Once | INTRAVENOUS | Status: AC
  Administered 2021-12-17: 500 mL via INTRAVENOUS

## 2021-12-17 MED ORDER — VANCOMYCIN VARIABLE DOSE PER UNSTABLE RENAL FUNCTION (PHARMACIST DOSING): Status: DC

## 2021-12-17 MED ORDER — MIDODRINE HCL 5 MG PO TABS
5.0000 mg | ORAL_TABLET | Freq: Three times a day (TID) | ORAL | Status: DC
  Filled 2021-12-17: qty 1

## 2021-12-17 MED ORDER — SODIUM BICARBONATE 8.4 % IV SOLN
100.0000 meq | Freq: Once | INTRAVENOUS | Status: AC
  Administered 2021-12-17: 100 meq via INTRAVENOUS

## 2021-12-17 MED ORDER — SODIUM CHLORIDE 0.9 % IV BOLUS: 1000.0000 mL | Freq: Once | INTRAVENOUS | Status: DC

## 2021-12-17 MED ORDER — DOCUSATE SODIUM 50 MG/5ML PO LIQD: 100.0000 mg | Freq: Two times a day (BID) | ORAL | Status: DC

## 2021-12-17 MED ORDER — SODIUM CHLORIDE 0.9 % IV SOLN: 2.0000 g | Freq: Once | INTRAVENOUS | Status: DC

## 2021-12-17 MED ORDER — FENTANYL 2500MCG IN NS 250ML (10MCG/ML) PREMIX INFUSION
50.0000 ug/h | INTRAVENOUS | Status: DC
  Administered 2021-12-17: 50 ug/h via INTRAVENOUS
  Filled 2021-12-17: qty 250

## 2021-12-17 MED ORDER — AMIODARONE IV BOLUS ONLY 150 MG/100ML
INTRAVENOUS | Status: AC
  Filled 2021-12-17: qty 100

## 2021-12-18 SURGERY — ENDARTERECTOMY, FEMORAL
Anesthesia: Choice | Laterality: Right

## 2021-12-18 NOTE — Progress Notes (Signed)
Physical Therapy Note  Patient Details  Name: Susan Horton MRN: [id number] Date of Birth: Jan 06, 1971 Today's Date: 12/18/2021  This patient was unable to complete the inpatient rehab program due to change in medical status resulting in pt being transferred off rehab unit; therefore did not meet their long term goals. Pt left the program at a mod assist level for their functional mobility/transfers using slideboard. This patient is being discharged from PT services at this time.  Pt's perception of pain in the last five days was unable to answer at this time.   See CareTool for functional status details  If the patient is able to return to inpatient rehabilitation within 3 midnights, this may be considered an interrupted stay and therapy services will resume as ordered. Modification and reinstatement of their goals will be made upon completion of therapy service reevaluations.   Blenda Nicely 12/18/2021, 12:05 PM  Physical Therapy Discharge Note

## 2021-12-18 NOTE — Progress Notes (Signed)
Occupational Therapy Note  Patient Details  Name: Susan Horton MRN: [id number] Date of Birth: 13-Apr-1970  Occupational Therapy Discharge Note  This patient was unable to complete the inpatient rehab program due to change in medical status with unplanned transfer off of rehab unit for medical care; therefore did not meet their long term goals. Pt left the program at bed level mod assist level for their functional ADLs. This patient is being discharged from OT services at this time.  BIMS at time of d/c pt unable to complete due to medical status.  See CareTool for functional status details.  If the patient is able to return to inpatient rehabilitation within 3 midnights, this may be considered an interrupted stay and therapy services will resume as ordered. Modification and reinstatement of their goals will be made upon completion of therapy service reevaluations.    [place], MS, OTR/L  12/18/2021, 12:08 PM

## 2021-12-18 NOTE — Progress Notes (Signed)
Inpatient Rehabilitation Care Coordinator Discharge Note   Patient Details  Name: Susan Horton MRN: [id number] Date of Birth: [birth year]   Discharge location: Acute  Length of Stay:    Discharge activity level: Mod/Max  Home/community participation:    Patient response QK:MMNOTR Literacy - How often do you need to have someone help you when you read instructions, pamphlets, or other written material from your doctor or pharmacy?: Patient unable to respond  Patient response RN:HAFBXU Isolation - How often do you feel lonely or isolated from those around you?: Patient unable to respond  Services provided included: Neuropsych, SW, Pharmacy, TR, CM, RN, SLP, OT, PT, RD, MD  Financial Services:  Financial Services Utilized: Medicare    Choices offered to/list presented to:    Follow-up services arranged:              Patient response to transportation need: Is the patient able to respond to transportation needs?: No In the past 12 months, has lack of transportation kept you from medical appointments or from getting medications?: No In the past 12 months, has lack of transportation kept you from meetings, work, or from getting things needed for daily living?: No    Comments (or additional information):  Patient/Family verbalized understanding of follow-up arrangements:  Yes  Individual responsible for coordination of the follow-up plan:    Confirmed correct DME delivered: Dyanne Iha 12/18/2021    Dyanne Iha

## 2021-12-19 LAB — CULTURE, BLOOD (ROUTINE X 2)

## 2021-12-21 LAB — TYPE AND SCREEN
ABO/RH(D): O POS
Antibody Screen: NEGATIVE
Unit division: 0

## 2021-12-21 LAB — BPAM RBC
Blood Product Expiration Date: 202401052359
Unit Type and Rh: 5100

## 2021-12-26 LAB — VITAMIN B1: Vitamin B1 (Thiamine): 400.5 nmol/L — ABNORMAL HIGH (ref 66.5–200.0)

## 2022-01-01 MED FILL — Medication: Qty: 1 | Status: AC

## 2022-01-11 NOTE — Progress Notes (Signed)
Pt. Arrived to [facility] after regaining ROSC on 6E. CCM at bedside. Upon arrival pt. mildly responsive; able to verbalize "use the bathroom", but stares blankly. Pt. Hypotensive and tachycardic. 500 cc bolus initiated;  Levophed initiated via right forearm PIV temporarily. Intubated by Dr. Valeta Harms. All extremities cold to touch with poor perfusion Faint femoral pulses palpated. CVC and arterial line placed. Levophed moved to CVC.   (Later) Family briefly informed in lobby of patient's condition and  brought to bedside. Family states the "She would not want this. She always said if I need life support let me go." Patient's mother and 2 of the patients daughters (both older that 61) all agree that she would not want to continue with care, but appear to be too emotional to make the decision. Family agrees that patient's sister, who was in the process of becoming the DPOA, will speak for the family and make all medical decisions.

## 2022-01-11 NOTE — Progress Notes (Signed)
PHARMACY - PHYSICIAN COMMUNICATION CRITICAL VALUE ALERT - BLOOD CULTURE IDENTIFICATION (BCID)  Susan Horton is an 52 y.o. female who presented to [facility] on 01/05/2022   BCID call patient growing staph aureus and strep species, patient now deceased. No follow up needed.   Erin Hearing PharmD., BCPS Clinical Pharmacist 12/20/2021 8:04 PM

## 2022-01-11 NOTE — Progress Notes (Incomplete)
{  CHL NUTRITION NOTE GMWN:02725}  DOCUMENTATION CODES:      INTERVENTION:  ***   NUTRITION DIAGNOSIS:     related to   as evidenced by  .  ***  GOAL:      ***  MONITOR:      REASON FOR ASSESSMENT:        ASSESSMENT:      ***   NUTRITION - FOCUSED PHYSICAL EXAM:  {RD Focused Exam List:21252}  Diet Order:   Diet Order             Diet NPO time specified  Diet effective now                   EDUCATION NEEDS:      Skin:     Last BM:     Height:   Ht Readings from Last 1 Encounters:  01/10/2022 '5\' 5"'$  (1.651 m)    Weight:   Wt Readings from Last 1 Encounters:  12/16/21 56.7 kg    Ideal Body Weight:     BMI:  Body mass index is 20.8 kg/m.  Estimated Nutritional Needs:   Kcal:     Protein:     Fluid:       ***

## 2022-01-11 NOTE — Progress Notes (Addendum)
Progress Note    2022/01/02 7:56 AM * No surgery date entered *  Subjective:  intubated, critically ill.  Mom is at the bedside   Vitals:   January 02, 2022 0715 01/02/22 0730  BP:    Pulse:    Resp: 13 (!) 30  Temp:    SpO2:     Physical Exam: Lungs:  mechanical ventilation Incisions:  L groin cath site without hematoma Extremities:  feet cold to touch, unable to find dopplerable flow; faintly palpable femoral pulses L greater than R Abdomen:  soft Neurologic: sedated  CBC    Component Value Date/Time   WBC 35.5 (H) 01/02/2022 0328   RBC 2.95 (L) 2022-01-02 0328   HGB 6.6 (LL) January 02, 2022 0328   HGB 9.2 (L) 07/26/2016 0000   HCT 24.2 (L) 01/02/22 0328   HCT 30.7 (L) 07/26/2016 0000   PLT 448 (H) Jan 02, 2022 0328   PLT 474 (H) 07/26/2016 0000   MCV 82.0 Jan 02, 2022 0328   MCV 82 07/26/2016 0000   MCH 22.4 (L) 2022/01/02 0328   MCHC 27.3 (L) 01-02-22 0328   RDW 19.6 (H) Jan 02, 2022 0328   RDW 13.6 07/26/2016 0000   LYMPHSABS 0.7 01/02/2022 0328   LYMPHSABS 2.9 07/26/2016 0000   MONOABS 0.4 2022-01-02 0328   EOSABS 0.0 01-02-2022 0328   EOSABS 0.1 07/26/2016 0000   BASOSABS 0.0 2022-01-02 0328   BASOSABS 0.0 07/26/2016 0000    BMET    Component Value Date/Time   NA 137 01-02-2022 0328   NA 132 (L) 07/26/2016 0000   K 4.4 02-Jan-2022 0328   CL 94 (L) 01-02-22 0328   CO2 11 (L) 02-Jan-2022 0328   GLUCOSE 261 (H) 01/02/22 0328   BUN 33 (H) 02-Jan-2022 0328   BUN 39 (H) 07/26/2016 0000   CREATININE 4.45 (H) 01/02/22 0328   CALCIUM 8.2 (L) 01-02-2022 0328   CALCIUM 8.4 (L) 06/21/2017 1939   GFRNONAA 11 (L) January 02, 2022 0328   GFRAA 6 (L) 06/08/2019 1506    INR    Component Value Date/Time   INR 2.7 (H) 2022-01-02 0609     Intake/Output Summary (Last 24 hours) at 01-02-22 0756 Last data filed at 12/16/2021 1850 Gross per 24 hour  Intake 160 ml  Output --  Net 160 ml     Assessment/Plan:  52 y.o. female is s/p Angiogram BLE with Angioplasty left  posterior tibial artery and stenting of right external iliac artery    Events noted overnight.  Patient is critically ill with soft pressure despite optimal pressor support.  Lactate >9.  BLE appear grossly ischemic.  She does have faint femoral pulses.  There are family discussions ongoing involving withdrawing care.  Vascular will continue to follow.   Dagoberto Ligas, PA-C [facility] [phone] January 02, 2022 7:56 AM  I agree with the above.  Last night's events noted.  The patient is now hemodynamically unstable, intubated in the ICU requiring high-dose pressors for adequate blood pressure.  She is in a headdown position to help with blood pressure control.  Her hemoglobin is down. -Acute blood loss anemia: No obvious source of bleeding.  Her groins remained soft as does her abdomen. -Pulmonary: Patient is intubated, being managed by critical care -Cardiovascular: She is requiring high-dose pressor therapy to maintain blood pressure.  Cardiac workup is unremarkable so far -Vascular: Her current condition will not bode well for salvage of her legs should she survive.  Surgery is canceled for tomorrow. -Renal: Considering CVVHD -Critical care is going to  have goals of care discussions with the family today.  [name]

## 2022-01-11 NOTE — Procedures (Signed)
Intubation Procedure Note  Susan Horton  [id number]  01-03-1971  Date:12-28-2021  Time:1:19 AM   Provider Performing:Shelly Spenser [name]   Procedure: Intubation (31500)  Indication(s) Respiratory Failure  Consent Unable to obtain consent due to emergent nature of procedure.  Anesthesia Etomidate and Rocuronium  Time Out Verified patient identification, verified procedure, site/side was marked, verified correct patient position, special equipment/implants available, medications/allergies/relevant history reviewed, required imaging and test results available.  Sterile Technique Usual hand hygeine, masks, and gloves were used  Procedure Description Patient positioned in bed supine.  Sedation given as noted above.  Patient was intubated with endotracheal tube using Glidescope.  View was Grade 1 full glottis .  Number of attempts was 1.  Colorimetric CO2 detector was consistent with tracheal placement.  Complications/Tolerance None; patient tolerated the procedure well. Chest X-ray is ordered to verify placement.  EBL Minimal  Specimen(s) None  Susan Nash, DO [place] [facility] 28-Dec-2021 1:19 AM

## 2022-01-11 NOTE — Progress Notes (Signed)
[facility] Progress Note Patient Name: Susan Horton DOB: [birth year] MRN: [id number]   Date of Service  December 26, 2021  HPI/Events of Note  Hypotension - BP = 91/62 on a Norepinephrine IV infusion at 40 mcg/min.   eICU Interventions  Plan: Monitor CVP now and Q 4 hours. NaHCO3 100 meq IV now. Repeat ABG at 7:30 AM. Increase ceiling on Norepinephrine IV infusion to 60 mcg/min. Add Vasopressin IV infusion at shock dose.      Intervention Category Major Interventions: Hypotension - evaluation and management  [name] 12-26-21, 6:46 AM

## 2022-01-11 NOTE — Assessment & Plan Note (Signed)
Send for C.diff, gastric panel

## 2022-01-11 NOTE — Progress Notes (Signed)
   January 16, 2022 1050  Clinical Encounter Type  Visited With Family  Visit Type Spiritual support;Death  Referral From Nurse  Consult/Referral To Chaplain  Spiritual Encounters  Spiritual Needs Prayer;Emotional;Grief support   Chaplain contacted by Unit secretary to support family member of patient who died. Spoke with family and learned that patient had a great meeting with family last week. However, her health declined this week. Family (mother, daughter, grandchildren,sisters) were present and Chaplain provided words of condolences and prayer. Chaplain coordinated with Nurse Katie to leave body of patient in room for at least 60 minutes before moving to the morgue.   Melody Haver, Resident Chaplain [phone]

## 2022-01-11 NOTE — Progress Notes (Addendum)
[facility] Progress Note Patient Name: Susan Horton DOB: 1970/08/23 MRN: [id number]   Date of Service  12/27/21  HPI/Events of Note  Multiple issues: 1. AFIB with RVR - Ventricular rate = 130-150. Currently on Norepinephrine IV infusion. 2. Anemia - Hgb  = 6.6. 3. Hyperkalemia - K+ = 4.6 on ISTAT. Mg++ = 2.2. Patient is on iHD.   eICU Interventions  Plan: Amiodarone IV load and infusion. Heparin IV infusion per pharmacy consult. Transfuse 1 unit PRBC.     Intervention Category Major Interventions: Arrhythmia - evaluation and management;Other:;Electrolyte abnormality - evaluation and management  Demontay Grantham Cornelia Copa 12/27/2021, 4:51 AM

## 2022-01-11 NOTE — IPAL (Signed)
Interdisciplinary Goals of Care Family Meeting   Date carried out:: 01/14/2022  Location of the meeting: Bedside  Member's involved: Physician, Bedside Registered Nurse, Pharmacist, and Family Member or next of kin  Durable Power of Attorney or acting medical decision maker: Gentry Roch    Discussion: We discussed goals of care for Susan Horton .    The Clinical status was relayed to all family including patient's mother, patient's sisters and 3 daughter daughter at bedside, while patient's son was on the phone call.   Updated and notified of patients medical condition.     Patient remains unresponsive, Patient is having a weak cough and struggling to remove secretions.   Patient with increased WOB and using accessory muscles to breathe   Patient with Progressive multiorgan failure with a very high probablity of a very minimal chance of meaningful recovery despite all aggressive and optimal medical therapy.  Code status: Full DNR  Disposition: In-patient comfort care    Family are satisfied with Plan of action and management. All questions answered   Jacky Kindle MD [facility] See Amion for pager If no response to pager, please call [phone] until 7pm After 7pm, Please call E-link [phone]

## 2022-01-11 NOTE — Code Documentation (Signed)
  Patient Name: Susan Horton   MRN: [id number]   Date of Birth/ Sex: Feb 15, 1970 , female      Admission Date: 01/02/2022  Attending Provider: Serafina Mitchell, MD  Primary Diagnosis: PAD (peripheral artery disease) [facility])   Indication: Pt was in her usual state of health until this AM, when she was noted to be hypoglycemic to 80 and without a pulse. Code blue was subsequently called. At the time of arrival on scene, ACLS protocol was underway.   Technical Description:  - CPR performance duration:  5 minute  - Was defibrillation or cardioversion used? Yes   - Was external pacer placed? No  - Was patient intubated pre/post CPR? No   Medications Administered: Y = Yes; Blank = No Amiodarone  Y  Atropine  N  Calcium  N  Epinephrine  Y  Lidocaine  N  Magnesium  N  Norepinephrine  N  Phenylephrine  N  Sodium bicarbonate  N  Vasopressin  N   Post CPR evaluation:  - Final Status - Was patient successfully resuscitated ? Yes - What is current rhythm? Sinus tachycardia - What is current hemodynamic status? HDS  Miscellaneous Information:  - Labs sent, including: POC glucose, ABG, CBC, BMP  - Primary team notified?  Yes  - Family Notified? Yes  - Additional notes/ transfer status: Transferred to ICU, hand off at bedside     Iona Coach, MD  12/27/2021, 12:54 AM

## 2022-01-11 NOTE — Progress Notes (Signed)
PCCM:  Called to the bedside again.  Patient with brief Henrene Pastor arrest state.  Bradycardic into the 30s.  Patient was given 1 of atropine.  Heart rate recovered into the 80s.  No obvious etiology except for hypoxic mediated.  Also currently difficult to get accurate O2 saturation from pulse oximetry.  Planning for arterial line and central line placement.  CC time: 20 mins   [name], DO [place] [facility] 12-27-21 1:40 AM

## 2022-01-11 NOTE — Procedures (Signed)
Arterial Catheter Insertion Procedure Note  Susan Horton  [id number]  1970-02-22  Date:01/03/2022  Time:2:02 AM    Provider Performing: Susan Horton    Procedure: Insertion of Arterial Line [phone]) with US guidance ([id number])   Indication(s) Blood pressure monitoring and/or need for frequent ABGs  Consent Unable to obtain consent due to emergent nature of procedure.  Anesthesia None   Time Out Verified patient identification, verified procedure, site/side was marked, verified correct patient position, special equipment/implants available, medications/allergies/relevant history reviewed, required imaging and test results available.   Sterile Technique Maximal sterile technique including full sterile barrier drape, hand hygiene, sterile gown, sterile gloves, mask, hair covering, sterile ultrasound probe cover (if used).   Procedure Description Area of catheter insertion was cleaned with chlorhexidine and draped in sterile fashion. With real-time ultrasound guidance an arterial catheter was placed into the right radial artery.  Appropriate arterial tracings confirmed on monitor.     Complications/Tolerance None; patient tolerated the procedure well.   EBL Minimal   Specimen(s) None  Susan Nash, DO [place] [facility] Jan 03, 2022 2:02 AM

## 2022-01-11 NOTE — Progress Notes (Addendum)
Nephrology note  Noted overnight events - pt with code x 2 overnight.  Intubated, into ICU.  Now with profound shock.  Stopped by - BP critically low - death is thought to be imminent. K/Mag ok. Serum bicarb 11, ca low - received both IV.   Family is distraught bedside.  I don't think CRRT would alter course at this point and she's too unstable to even try.  Please let me know if there's anything I can do to help.    Jannifer Hick MD [facility] Pager [phone]

## 2022-01-11 NOTE — Progress Notes (Signed)
Pharmacy Antibiotic Note  Susan Horton is a 52 y.o. female admitted on 01/10/2022 with sepsis.  Pharmacy has been consulted for cefepime and vancomycin dosing.  WBC up to 35, afebrile. PCT up to 8.79, LA >9. Underwent code blue last night. On elevated rates of vasopressors. Known ESRD patient on HD - currently RRT on hold. Of note, has PCN allergy listed but has tolerated cefepime in the past.   Plan: Vancomycin 1250 mg IV once - will f/u RRT plans and order accordingly  Cefepime 2g IV once then 1g IV every 24 hours starting on 12/8 Monitor RRT plans, cx results, clinical pic, and vanc levels as indicated  Height: '5\' 5"'$  (165.1 cm) Weight: 56.7 kg (125 lb) IBW/kg (Calculated) : 57  Temp (24hrs), Avg:97.7 F (36.5 C), Min:97.4 F (36.3 C), Max:98 F (36.7 C)  Recent Labs  Lab 12/14/21 1512 12/16/21 0217 28-Dec-2021 0328  WBC 17.4* 19.0* 35.5*  CREATININE 5.96* 5.15* 4.45*  LATICACIDVEN  --   --  >9.0*    Estimated Creatinine Clearance: 13.4 mL/min (A) (by C-G formula based on SCr of 4.45 mg/dL (H)).    Allergies  Allergen Reactions   Penicillin G Anaphylaxis, Itching, Swelling and Rash    unknown   Repatha [Evolocumab] Itching   Gabapentin Other (See Comments)    confusion   Zestril [Lisinopril] Cough    Antimicrobials this admission: Cefepime 12/7 >>  Vancomycin 12/7 >>   Dose adjustments this admission: N/A  Microbiology results: MRSA PCR 12/7: neg Bcx 12/7: sent GI panel 12/7: sent C diff 12/7: sent  Thank you for allowing pharmacy to be a part of this patient's care.  Antonietta Jewel, PharmD, [place] Clinical Pharmacist  Phone: [phone] December 28, 2021 9:24 AM  Please check AMION for all [place] phone numbers After 10:00 PM, call [place] [phone]

## 2022-01-11 NOTE — Procedures (Signed)
Central Venous Catheter Insertion Procedure Note  ALINNA SIPLE  [id number]  [birth year]  Date:2021-12-23  Time:2:54 AM   Provider Performing:Donnielle Addison Jerilynn Mages Ayesha Rumpf   Procedure: Insertion of Non-tunneled Central Venous Catheter(36556) with US guidance (60156)   Indication(s) Medication administration and Difficult access  Consent Unable to obtain consent due to emergent nature of procedure.  Anesthesia Topical only with 1% lidocaine   Timeout Verified patient identification, verified procedure, site/side was marked, verified correct patient position, special equipment/implants available, medications/allergies/relevant history reviewed, required imaging and test results available.  Sterile Technique Maximal sterile technique including full sterile barrier drape, hand hygiene, sterile gown, sterile gloves, mask, hair covering, sterile ultrasound probe cover (if used).  Procedure Description Area of catheter insertion was cleaned with chlorhexidine and draped in sterile fashion.  With real-time ultrasound guidance a central venous catheter was placed into the right femoral vein. Nonpulsatile blood flow and easy flushing noted in all ports.  The catheter was sutured in place and sterile dressing applied.    Complications/Tolerance None; patient tolerated the procedure well. Chest x-ray is not ordered for femoral cannulation.  EBL Minimal  Specimen(s) None  Lestine Mount, [place] [facility] December 23, 2021 2:55 AM  Please see [email or web address] for pager details.  From 7A-7P if no response, please call [phone] After hours, please call ELink [phone]

## 2022-01-11 NOTE — Progress Notes (Signed)
2D echo attempted. RN states no longer needed. Will try later if echo is needed.

## 2022-01-11 NOTE — Progress Notes (Signed)
52 year old female with end-stage renal disease on hemodialysis, coronary artery disease and seizure predatory arterial disease who was transferred to ICU overnight in the setting of shock and acute respiratory failure  This morning patient was noted to be on high-dose vasopressor support including norepinephrine 60 mics and vasopressin still her map was in 60s She remained intubated and on full mechanical ventilatory support    Physical exam: General: Crtitically ill-appearing female, orally intubated HEENT: [place]/AT, eyes anicteric.  ETT and OGT in place Neuro: Eyes open, not following commands, no response to painful stimuli.  Positive cough and gag, pupils are minimally reactive Chest: Gasping for air even though patient is on full mechanical ventilatory support, coarse breath sounds, no wheezes or rhonchi Heart: Regular rate and rhythm, no murmurs or gallops Abdomen: Soft, nondistended, bowel sounds present Skin: No rash  ABGs: 7.0/25/186 99%  Assessment and plan: Shock, mixed septic and cardiogenic Severe high anion gap metabolic acidosis End-stage renal disease on hemodialysis Severe peripheral arterial disease with bilateral lower leg ischemia s/p stenting Shock liver Lactic acidosis Acute on chronic anemia Acute respiratory failure with hypoxia Chronic combined systolic and diastolic congestive heart failure  Continue aggressive vasopressor support Patient received multiple boluses of bicarbonate Continue bicarbonate infusion Nephrology is consulting Trend LFTs Trend ABGs and lactic acid level Will transfuse 2 units of PRBCs Continue lung protective ventilation Monitor intake and output  Poor prognosis  Continue goals of care discussion   This patient is critically ill with multiple organ system failure which requires frequent high complexity decision making, assessment, support, evaluation, and titration of therapies. This was completed through the application of  advanced monitoring technologies and extensive interpretation of multiple databases.  During this encounter critical care time was devoted to patient care services described in this note for 44 minutes.    Jacky Kindle, MD [place] [facility] See Amion for pager If no response to pager, please call [phone] until 7pm After 7pm, Please call E-link [phone]

## 2022-01-11 NOTE — Discharge Summary (Signed)
Discharge Summary    Susan Horton [birth year] 52 y.o. female  [id number]  Admission Date: 12/27/2021  Discharge Date: 2022/01/10 Physician: No att. providers found  Admission Diagnosis: PAD (peripheral artery disease) ([place]) [I73.9]   HPI:   This is a 52 y.o. female who has undergone several attempts at revascularization.  She was admitted with toxic metabolic encephalopathy as well as fatigue and weakness.  She has had left hip humeral calcinosis surgery at [facility].  She was ultimately transferred to rehab where she developed ischemic changes to both feet, left greater than right.  She was having significant pain as well as cellulitis.  I ordered an ultrasound to evaluate her recent interventions and this showed multiple areas of stenosis in both legs.  I felt that we should probably proceed with angiography prior to any form of amputation.  This was performed on December 7.  She had to be kept in the hospital after her catheterization because rehab could not recover her.  I had extensive conversations with her sister regarding her need for amputation.This was performed on the following day she went to dialysis but was not herself, being less responsive.  Her dialysis was not completed secondary to hypotension and hypoglycemia with tachycardia.  Hospital consultation was performed.  This was followed by critical care evaluation.She ultimately coded and required intubation and was taken to the ICU.  She continued to decline requiring max dose pressors.  Ultimately it was felt that with her comorbidities and current clinical condition that this was not a survivable event.  Decision was made to transition to comfort care.  The patient ultimately expired.      Discharge Diagnosis:  PAD (peripheral artery disease) ([place]) [I73.9]  Secondary Diagnosis: Patient Active Problem List   Diagnosis Date Noted   Shock ([place]) 01/10/2022   A-fib ([place]) 12/16/2021   Acute encephalopathy 12/16/2021    PAD (peripheral artery disease) ([place]) 12/25/2021   Morbid obesity ([place]) 12/11/2021   Sacral decubitus ulcer, stage II ([place]) 12/11/2021   Sensorineural hearing loss (SNHL) of both ears 11/17/2021   Pressure injury of skin 29/92/4268   Acute metabolic encephalopathy    Severe sepsis ([place])    Cellulitis    Anemia due to chronic kidney disease, on chronic dialysis ([place])    Altered mental status    Sepsis ([place]) 11/08/2021   Diabetes mellitus ([place]) 01/29/2021   Hypertension associated with stage 5 chronic kidney disease due to type 2 diabetes mellitus ([place]) 01/29/2021   Tumoral calcinosis 01/29/2021   Dysphagia    Nausea 09/12/2019   Diarrhea 09/12/2019   Pruritus, unspecified 08/13/2019   Anaphylactic shock, unspecified, sequela 08/13/2019   Allergy, unspecified, sequela 08/13/2019   Unspecified cataract 03/19/2019   Unspecified asthma with (acute) exacerbation 03/19/2019   COVID-19 03/06/2019   Intractable nausea and vomiting 02/24/2019   Abdominal pain, epigastric 02/24/2019   Pneumonia of right lung due to infectious organism 02/24/2019   Shortness of breath 01/19/2019   Cerebrovascular small vessel disease    Generalized weakness    Abscess of sacrum (HCC)    Gluteal abscess 12/25/2018   Moderate protein-calorie malnutrition ([place]) 12/19/2018   Other disorders resulting from impaired renal tubular function 12/19/2018   Liver disease, unspecified 12/19/2018   Hyperkalemia 12/19/2018   Acidosis 12/19/2018   Hyperlipidemia 11/10/2018   History of arteriosclerotic vascular disease 11/10/2018   Carotid stenosis 11/10/2018   Symptomatic anemia 09/16/2018   Peripheral vascular disease, unspecified ([place]) 09/04/2018   Other disorders of bilirubin  metabolism 01/20/2018   Other long term (current) drug therapy 11/28/2017   Other abnormal findings in urine 11/28/2017   Dependence on renal dialysis ([place]) 11/28/2017   ESRD on peritoneal dialysis ([place]) 91/91/6606   Complication of  vascular dialysis catheter 09/15/2017   History of anemia due to CKD 07/18/2017   DVT (deep vein thrombosis) in pregnancy 07/18/2017   Encounter for removal of sutures 07/07/2017   Iron deficiency anemia, unspecified 06/30/2017   Secondary hyperparathyroidism of renal origin ([place]) 06/24/2017   Pain, unspecified 06/24/2017   Fever, unspecified 06/24/2017   ESRD (end stage renal disease) ([place]) 06/24/2017   Coagulation defect, unspecified ([place]) 06/24/2017   Anemia in chronic kidney disease 06/24/2017   Chronic combined systolic and diastolic congestive heart failure ([place]) 06/18/2017   Moderate persistent asthma 06/10/2017   Adjustment disorder with depressed mood 09/08/2016   CKD (chronic kidney disease) stage 4, GFR 15-29 ml/min (HCC)    CAD S/P mLAD PCI with DES 08/06/2016   Presence of drug coated stent in LAD coronary artery 08/06/2016   Atherosclerotic heart disease of native coronary artery without angina pectoris 07/11/2016   Carotid bruit present 06/15/2016   Dyslipidemia 06/15/2016   Smoker 06/15/2016   Neuropathy 03/10/2016   Essential hypertension, benign 12/11/2015   Left facial pain 03/04/2015   Abnormal sense of taste 03/04/2015   Gastroparesis    Uncontrolled type 2 diabetes mellitus with peripheral neuropathy 06/05/2014   Yeast infection of the vagina 07/10/2010   Past Medical History:  Diagnosis Date   Abnormal stress test    Anemia    Angio-edema    Asthma    CAD (coronary artery disease)    DES to mid LAD July 2018, residual moderate RCA disease   Cataract    CKD (chronic kidney disease) stage 4, GFR 15-29 ml/min (HCC)    Dialysis T/Th/Sa   DVT (deep venous thrombosis) ([place])    1996 during pregnancy, 2015 left leg   Eczema    Essential hypertension 12/11/2015   Gastroparesis    GERD (gastroesophageal reflux disease)    Gluteal abscess 12/27/2018   Hidradenitis    Migraine    Neuropathy    Peripheral vascular disease (HCC)    blood clot in leg    Progressive angina ([place]) 06/05/2014   Chest pain   Type 2 diabetes mellitus (HCC)    Type II diabetes mellitus ([place])    Urticaria      Disposition: deceased    [name], MD [facility] [phone] 01/07/2022  2:54 PM

## 2022-01-11 NOTE — Assessment & Plan Note (Signed)
In the setting of hypotention, and relative hypoglycemia Did not tolerate CT head VBG no hypercarbia Ammonia pending Plan was to Administer IV thiamin and follow by D25 as needed EEG in AM  CBG q2h

## 2022-01-11 NOTE — H&P (Signed)
[facility]  Patient name: Susan Horton MRN: [id number] DOB: [birth year] Sex: female     HISOTRY OF PRESENT ILLNESS:    Susan Horton is a 52 y.o. female with bilateral foot wounds in the setting of PAD.  She has previously undergone percutaneous interventions, bit her wounds have not improved.  Recent arterial duplex shows multiple areas of bilateral stenosis.  She underwent angiography today but needs to be monitored overnight in the hospital before returning to rehab   PAST MEDICAL HISTORY:   Past Medical History:  Diagnosis Date   Abnormal stress test    Anemia    Angio-edema    Asthma    CAD (coronary artery disease)    DES to mid LAD July 2018, residual moderate RCA disease   Cataract    CKD (chronic kidney disease) stage 4, GFR 15-29 ml/min (HCC)    Dialysis T/Th/Sa   DVT (deep venous thrombosis) ([place])    1996 during pregnancy, 2015 left leg   Eczema    Essential hypertension 12/11/2015   Gastroparesis    GERD (gastroesophageal reflux disease)    Gluteal abscess 12/27/2018   Hidradenitis    Migraine    Neuropathy    Peripheral vascular disease (HCC)    blood clot in leg   Progressive angina (HCC) 06/05/2014   Chest pain   Type 2 diabetes mellitus ([place])    Type II diabetes mellitus ([place])    Urticaria      FAMILY HISTORY:   Family History  Problem Relation Age of Onset   Allergic rhinitis Mother    Hypertension Father    Allergic rhinitis Father    Hypertension Sister    Allergic rhinitis Sister    Hypertension Brother    Allergic rhinitis Brother    Breast cancer Cousin        x2    SOCIAL HISTORY:   Social History   Tobacco Use   Smoking status: Former    Years: 20.00    Types: Cigarettes    Quit date: 11/2016    Years since quitting: 5.1   Smokeless tobacco: Never  Substance Use Topics   Alcohol use: No    Alcohol/week: 0.0 standard drinks of alcohol     ALLERGIES:    Allergies  Allergen Reactions   Penicillin G Anaphylaxis, Itching, Swelling and Rash    unknown   Repatha [Evolocumab] Itching   Gabapentin Other (See Comments)    confusion   Zestril [Lisinopril] Cough     CURRENT MEDICATIONS:   No current facility-administered medications for this encounter.   Current Outpatient Medications  Medication Sig Dispense Refill   albuterol (VENTOLIN HFA) 108 (90 Base) MCG/ACT inhaler Inhale 1-2 puffs into the lungs every 6 (six) hours as needed for wheezing or shortness of breath. 18 g 2   cyclobenzaprine (FLEXERIL) 10 MG tablet Take 1 tablet (10 mg total) by mouth 2 (two) times daily as needed. 60 tablet 1   fluticasone furoate-vilanterol (BREO ELLIPTA) 200-25 MCG/ACT AEPB Inhale 1 puff into the lungs daily. 60 each 3   HYDROcodone-acetaminophen (NORCO/VICODIN) 5-325 MG tablet Take 1 tablet by mouth every 4 (four) hours as needed.     ipratropium-albuterol (DUONEB) 0.5-2.5 (3) MG/3ML SOLN Take 3 mLs by nebulization every 6 (six) hours as needed. 360 mL 2   montelukast (SINGULAIR) 10 MG tablet Take 1 tablet (10 mg total) by mouth at bedtime. 90 tablet 1   oxyCODONE 10 MG TABS Take  1-1.5 tablets (10-15 mg total) by mouth every 4 (four) hours as needed for moderate pain or severe pain.     promethazine (PHENERGAN) 25 MG tablet Take 1 tablet (25 mg total) by mouth every 8 (eight) hours as needed for nausea or vomiting. 30 tablet 1   aspirin 81 MG chewable tablet Chew 1 tablet (81 mg total) by mouth daily. (Patient not taking: Reported on 12/16/2021) 30 tablet 0   atorvastatin (LIPITOR) 40 MG tablet TAKE 1 TABLET BY MOUTH EVERY DAY (Patient not taking: Reported on 12/16/2021) 90 tablet 1   B Complex-C-Zn-Folic Acid (DIALYVITE/ZINC) TABS Take 1 tablet by mouth daily. (Patient not taking: Reported on 12/16/2021)     calcitRIOL (ROCALTROL) 0.5 MCG capsule Take 1.5 mcg by mouth daily. (Patient not taking: Reported on 12/16/2021)     clopidogrel (PLAVIX) 75 MG tablet  TAKE 1 TABLET (75 MG TOTAL) BY MOUTH DAILY WITH BREAKFAST. (Patient not taking: Reported on 12/16/2021) 90 tablet 3   Difelikefalin Acetate (KORSUVA) 65 MCG/1.3ML SOLN Korsuva (difelikefalin) IVP Maintenance Dose (Patient not taking: Reported on 12/16/2021)     docusate sodium (COLACE) 100 MG capsule Take 100 mg by mouth 2 (two) times daily. (Patient not taking: Reported on 12/16/2021)     iron sucrose (VENOFER) 20 MG/ML injection Iron Sucrose (Venofer) (Patient not taking: Reported on 11/08/2021)     Methoxy PEG-Epoetin Beta (MIRCERA IJ) Inject into the skin. (Patient not taking: Reported on 12/16/2021)     metoCLOPramide (REGLAN) 10 MG tablet Take 1 tablet (10 mg total) by mouth 3 (three) times daily before meals.     metoprolol succinate (TOPROL-XL) 25 MG 24 hr tablet Take 1 tablet (25 mg total) by mouth daily. TAKE 1.5 TABLETS BY MOUTH DAILY     Misc. Devices MISC Electric scooter for mobility 1 each 0   nitroGLYCERIN (NITROSTAT) 0.4 MG SL tablet Place 1 tablet (0.4 mg total) under the tongue every 5 (five) minutes as needed for chest pain. 25 tablet 2   PRALUENT 75 MG/ML SOAJ INJECT 75 MG INTO THE SKIN EVERY 14 (FOURTEEN) DAYS. (Patient not taking: Reported on 12/16/2021) 2 mL 11    REVIEW OF SYSTEMS:   '[X]'$  denotes positive finding, '[ ]'$  denotes negative finding Cardiac  Comments:  Chest pain or chest pressure:    Shortness of breath upon exertion:    Short of breath when lying flat:    Irregular heart rhythm:        Vascular    Pain in calf, thigh, or hip brought on by ambulation:    Pain in feet at night that wakes you up from your sleep:  x   Blood clot in your veins:    Leg swelling:         Pulmonary    Oxygen at home:    Productive cough:     Wheezing:         Neurologic    Sudden weakness in arms or legs:     Sudden numbness in arms or legs:     Sudden onset of difficulty speaking or slurred speech:    Temporary loss of vision in one eye:     Problems with dizziness:          Gastrointestinal    Blood in stool:     Vomited blood:         Genitourinary    Burning when urinating:     Blood in urine:        Psychiatric  Major depression:         Hematologic    Bleeding problems:    Problems with blood clotting too easily:        Skin    Rashes or ulcers: x       Constitutional    Fever or chills:      PHYSICAL EXAM:   Vitals:   01/08/2022 0930 01/08/2022 0945 01/08/22 1000 01-08-2022 1015  BP:      Pulse:      Resp: (!) 25 (!) 22 (!) 22 14  Temp:      TempSrc:      SpO2:      Weight:      Height:        GENERAL: The patient is a well-nourished female, in no acute distress. The vital signs are documented above. CARDIAC: There is a regular rate and rhythm.  VASCULAR: right groin access site without hematoma.  Palpable pedal pulses PULMONARY: Non-labored respirations NEUROLOGIC: No focal weakness or paresthesias are detected. PSYCHIATRIC: The patient has a normal affect.    MEDICAL ISSUES:   The patient underwent angiography earlier today and needs to be monitored in the hospital overnight before return to rehab.    Leia Alf, MD, FACS [facility] [phone] Pager [phone]

## 2022-01-11 NOTE — Progress Notes (Signed)
ANTICOAGULATION CONSULT NOTE - Initial Consult  Pharmacy Consult for heparin Indication: atrial fibrillation  Allergies  Allergen Reactions   Penicillin G Anaphylaxis, Itching, Swelling and Rash    unknown   Repatha [Evolocumab] Itching   Gabapentin Other (See Comments)    confusion   Zestril [Lisinopril] Cough    Patient Measurements: Height: '5\' 5"'$  (165.1 cm) Weight: 56.7 kg (125 lb) IBW/kg (Calculated) : 57  Vital Signs: Temp: 97.4 F (36.3 C) (12/06 2030) Temp Source: Axillary (12/06 2030) BP: 99/55 (12/07 0429) Pulse Rate: 131 (12/07 0429)  Labs: Recent Labs    12/14/21 1512 12/16/21 0217 12-27-21 0201 2021-12-27 0328  HGB 7.2* 7.9* 7.5* 6.6*  HCT 25.0* 27.6* 22.0* 24.2*  PLT 463* 544*  --  448*  CREATININE 5.96* 5.15*  --   --   TROPONINIHS  --   --   --  56*    Estimated Creatinine Clearance: 11.6 mL/min (A) (by C-G formula based on SCr of 5.15 mg/dL (H)).   Medical History: Past Medical History:  Diagnosis Date   Abnormal stress test    Anemia    Angio-edema    Asthma    CAD (coronary artery disease)    DES to mid LAD July 2018, residual moderate RCA disease   Cataract    CKD (chronic kidney disease) stage 4, GFR 15-29 ml/min (HCC)    Dialysis T/Th/Sa   DVT (deep venous thrombosis) ([place])    1996 during pregnancy, 2015 left leg   Eczema    Essential hypertension 12/11/2015   Gastroparesis    GERD (gastroesophageal reflux disease)    Gluteal abscess 12/27/2018   Hidradenitis    Migraine    Neuropathy    Peripheral vascular disease (HCC)    blood clot in leg   Progressive angina ([place]) 06/05/2014   Chest pain   Type 2 diabetes mellitus (HCC)    Type II diabetes mellitus (HCC)    Urticaria     Medications:  Medications Prior to Admission  Medication Sig Dispense Refill Last Dose   albuterol (VENTOLIN HFA) 108 (90 Base) MCG/ACT inhaler Inhale 1-2 puffs into the lungs every 6 (six) hours as needed for wheezing or shortness of breath. 18 g 2  unk   cyclobenzaprine (FLEXERIL) 10 MG tablet Take 1 tablet (10 mg total) by mouth 2 (two) times daily as needed. 60 tablet 1 unk   fluticasone furoate-vilanterol (BREO ELLIPTA) 200-25 MCG/ACT AEPB Inhale 1 puff into the lungs daily. 60 each 3 unk   HYDROcodone-acetaminophen (NORCO/VICODIN) 5-325 MG tablet Take 1 tablet by mouth every 4 (four) hours as needed.   unk   ipratropium-albuterol (DUONEB) 0.5-2.5 (3) MG/3ML SOLN Take 3 mLs by nebulization every 6 (six) hours as needed. 360 mL 2 unk   montelukast (SINGULAIR) 10 MG tablet Take 1 tablet (10 mg total) by mouth at bedtime. 90 tablet 1 unk   oxyCODONE 10 MG TABS Take 1-1.5 tablets (10-15 mg total) by mouth every 4 (four) hours as needed for moderate pain or severe pain.   new med at unk   promethazine (PHENERGAN) 25 MG tablet Take 1 tablet (25 mg total) by mouth every 8 (eight) hours as needed for nausea or vomiting. 30 tablet 1 unk   aspirin 81 MG chewable tablet Chew 1 tablet (81 mg total) by mouth daily. (Patient not taking: Reported on 12/16/2021) 30 tablet 0 Not Taking   atorvastatin (LIPITOR) 40 MG tablet TAKE 1 TABLET BY MOUTH EVERY DAY (Patient not taking: Reported  on 12/16/2021) 90 tablet 1 Not Taking   B Complex-C-Zn-Folic Acid (DIALYVITE/ZINC) TABS Take 1 tablet by mouth daily. (Patient not taking: Reported on 12/16/2021)   Not Taking   calcitRIOL (ROCALTROL) 0.5 MCG capsule Take 1.5 mcg by mouth daily. (Patient not taking: Reported on 12/16/2021)   Not Taking   clopidogrel (PLAVIX) 75 MG tablet TAKE 1 TABLET (75 MG TOTAL) BY MOUTH DAILY WITH BREAKFAST. (Patient not taking: Reported on 12/16/2021) 90 tablet 3 Not Taking   Difelikefalin Acetate (KORSUVA) 65 MCG/1.3ML SOLN Korsuva (difelikefalin) IVP Maintenance Dose (Patient not taking: Reported on 12/16/2021)   Not Taking   docusate sodium (COLACE) 100 MG capsule Take 100 mg by mouth 2 (two) times daily. (Patient not taking: Reported on 12/16/2021)   Not Taking   iron sucrose (VENOFER) 20  MG/ML injection Iron Sucrose (Venofer) (Patient not taking: Reported on 11/08/2021)   Not Taking   Methoxy PEG-Epoetin Beta (MIRCERA IJ) Inject into the skin. (Patient not taking: Reported on 12/16/2021)   Not Taking   metoCLOPramide (REGLAN) 10 MG tablet Take 1 tablet (10 mg total) by mouth 3 (three) times daily before meals.   new med at unk   metoprolol succinate (TOPROL-XL) 25 MG 24 hr tablet Take 1 tablet (25 mg total) by mouth daily. TAKE 1.5 TABLETS BY MOUTH DAILY   new med at [facility]. Devices MISC Electric scooter for mobility 1 each 0    nitroGLYCERIN (NITROSTAT) 0.4 MG SL tablet Place 1 tablet (0.4 mg total) under the tongue every 5 (five) minutes as needed for chest pain. 25 tablet 2 unk   PRALUENT 75 MG/ML SOAJ INJECT 75 MG INTO THE SKIN EVERY 14 (FOURTEEN) DAYS. (Patient not taking: Reported on 12/16/2021) 2 mL 11 Not Taking   Scheduled:   amiodarone  150 mg Intravenous Once   aspirin EC  81 mg Oral Daily   Chlorhexidine Gluconate Cloth  6 each Topical Daily   clopidogrel  75 mg Oral Q breakfast   dextrose  12.5 g Intravenous STAT   dextrose  25 mL Intravenous Once   docusate  100 mg Per Tube BID   etomidate       fentaNYL       fentaNYL (SUBLIMAZE) injection  50 mcg Intravenous Once   glucagon (human recombinant)       midodrine  5 mg Oral TID WC   pantoprazole (PROTONIX) IV  40 mg Intravenous Daily   phenylephrine       polyethylene glycol  17 g Per Tube Daily   rocuronium bromide       sodium chloride flush  3 mL Intravenous Q12H   succinylcholine       thiamine (VITAMIN B1) injection  100 mg Intravenous Daily   Infusions:   sodium chloride     sodium chloride     amiodarone     Followed by   amiodarone     dexmedetomidine (PRECEDEX) IV infusion     dexmedetomidine     fentaNYL infusion INTRAVENOUS     norepinephrine (LEVOPHED) Adult infusion     norepinephrine     sodium bicarbonate 150 mEq in dextrose 5 % 1,150 mL infusion     sodium chloride       Assessment: 52yo female admitted for PVD, coded overnight requiring shock x1 and amio bolus, tx'd to ICU and intubated, now in Afib w/ RVR, to begin heparin.  Goal of Therapy:  Heparin level 0.3-0.7 units/ml Monitor platelets by anticoagulation protocol:  Yes   Plan:  Will defer heparin bolus given Hgb 6.6 (known chronic anemia of CKD, getting tranfused). Start heparin infusion at 800 units/hr. Monitor heparin levels and CBC.  Wynona Neat, PharmD, BCPS  2021-12-31,4:57 AM

## 2022-01-11 NOTE — Progress Notes (Signed)
ETT removed after TOD per family and RN request.

## 2022-01-11 NOTE — Progress Notes (Addendum)
Pulmonary critical care, consult note:  Epic is currently down. Unable to use standard pulmonary critical care, progress, note documentation. Progress note entered using epic Haiku.  S: this is a 52 year-old female, past medical history of stage renal disease on dialysis, coronary order disease currently on aspirin, plus Plavix, hypertension, diastolic and systolic congestive heart failure, type two diabetes with peripheral  neuropathy, lower extremity, DVT, history of asthma, quit smoking, five years ago, peripheral vascular disease with history of left iliac artery and left SFA standing. She also had recent excision of tumor calcinosis within the left hip and thigh back in October 2023. She had infection related to this and sepsis on a previous hospital admission. She has had multiple hospital admissions over the past several months. She was admitted to t he intensive care unit from 1029, 2023 to 11 six 2023. She has been recently admitted during this hospitalization by Vascular Surgery due to a cold, right lower extremity.  Two days ago, patient was taken for ultrasound, guided access of the right femoral artery with an abdominal aortogram, and a bilateral lower extremity runoff, angioplasty of the left posterior tibial artery, and a stent within the right iliac artery. This was c ompleted by Vascular Surgery.  For the past couple of days she has had ongoing intermittent diarrhea. Poor PO intake. Also receiving dialysis. Today during dialysis she became more hypotensive, and Rapid Response was called.  The hospitalist service was consulted to see patient for medical management.  Pulmonary critical care was consulted due to patients somewhat waxing waning, mental status and low blood pressures.<B R> Vital signs: Heart rate 88, systolic blood pressure 98, 02 sat 96% on room air.  General: chronically, ill, appearing debilitated, female lying in bed. Neuro: awake, alert, nose, location, follow  commands. Extremities: significant muscle wasting, thin. Very cold, right lower extremity and left lower extremity, some bull forming on the dorsal aspect of the right foot. Heart: regular rhythm, S1 S2 lungs: no crackle s no wheeze. Abdomen: nondistended.   Labs: sodium 134, potassium 3.8, BUN 36, serum creatinine 5.1 CBC 19, hemoglobin 7.9 WBC has been increasing  Chest x-ray: Enlarge, cardiac silhouette, no infiltrate, reviewed on x-ray machine in the room. Due to the system being down, not pushed to the radiologist for reading yet.  Assessment: Hypotension, poor PO intake, diarrhea, ongoing dialysis. Multiple medical  comorbidities. End Stage Renal Disease on dialysis Possible sepsis Peripheral arterial disease, lower extremity ischemia.  Plan: Clinically she looks hypovolemic. Agree with judicious fluid resuscitation. Has already received a 250 cc bolus and blood pressures are stable. Would consider 250 cc of Albumin. Reviewing her chart she has chronically low blood pressures, could consider initiation of midodrine<BR >I don't think that she needs the intensive care unit right now. If her mental status changes or her hypotension persists, then we can move her. Nephrology has suggested in the past starting patient on midodrine  Would consider checking lactic acid and blood cultures, especially due to frequent accessing of her fistula. She is at risk for bacteremia. Would start broad spectrum antibiotic coverage.  We appreciate the consultation.  June Leap, DO    PCCM attending:  Update to previous consult note.  Approximately 30 minutes after we came to see and assessed the patient she became unresponsive.  Felt to have low blood glucose which she has has this occur before.  She was felt to briefly lose pulse.  CPR was started.  She was given 1 of epi.  She had VT following the epi that was given that resolved on its own.  She received less than 1 minute of chest  compressions.  She was able to respond to commands after arrest.  We were called back to see the patient.  Patient was transferred to the intensive care unit.  Upon arrival to the intensive care unit the patient was intubated.  Please see separate procedure note.  Assessment: In-hospital cardiac arrest Hypoglycemia Hypotension following cardiac arrest Multiple brief sinus arrest pauses noted on telemetry.  Plan: Initiate norepinephrine to maintain mean arterial pressure greater than 65. Likely need to establish vascular access. Continue plan as above with broad-spectrum antibiotics Frequent every hour blood glucose levels.  Continue to treat blood glucose with CBG less than 60. If continues to remain low we will start D5 infusion Sedation with Precedex and as needed fentanyl  This patient is critically ill with multiple organ system failure; which, requires frequent high complexity decision making, assessment, support, evaluation, and titration of therapies. This was completed through the application of advanced monitoring technologies and extensive interpretation of multiple databases. During this encounter critical care time was devoted to patient care services described in this note for 36 minutes.  [facility] 2022-01-11 1:18 AM

## 2022-01-11 NOTE — Progress Notes (Signed)
Recreational Therapy Note  Patient Details  Name: Susan Horton MRN: [id number] Date of Birth: 1970-12-31 Today's Date: 12/18/2021   This patient was unable to complete the inpatient rehab program due to change in medical status resulting in pt being transferred off rehab unit; therefore did not meet their long term goals. Pt is discharged from recreation therapy.   If the patient is able to return to inpatient rehabilitation within 3 midnights, this may be considered an interrupted stay and therapy services will resume as ordered. Modification and reinstatement of their goals will be made upon completion of therapy service reevaluations.

## 2022-01-11 NOTE — Progress Notes (Signed)
Time of death verified by RN x2 at this time. Dr. Tacy Learn aware, [facility] to be notified.

## 2022-01-11 DEATH — deceased

## 2022-01-19 MED FILL — Medication: Qty: 2 | Status: AC

## 2022-02-02 ENCOUNTER — Ambulatory Visit: Payer: Medicare Other | Admitting: Family Medicine

## 2022-03-09 ENCOUNTER — Ambulatory Visit: Payer: Self-pay | Admitting: Family Medicine

## 2023-07-09 IMAGING — MG MM DIGITAL SCREENING BILAT W/ TOMO AND CAD
8 series · 9 of 24 positions shown · non-contrast
Comparison: Previous exam(s).

CLINICAL DATA: Screening.

EXAM:
DIGITAL SCREENING BILATERAL MAMMOGRAM WITH TOMOSYNTHESIS AND CAD
TECHNIQUE: Bilateral screening digital craniocaudal and mediolateral oblique
mammograms were obtained. Bilateral screening digital breast
tomosynthesis was performed. The images were evaluated with
computer-aided detection.

[R MLO synth-2D]
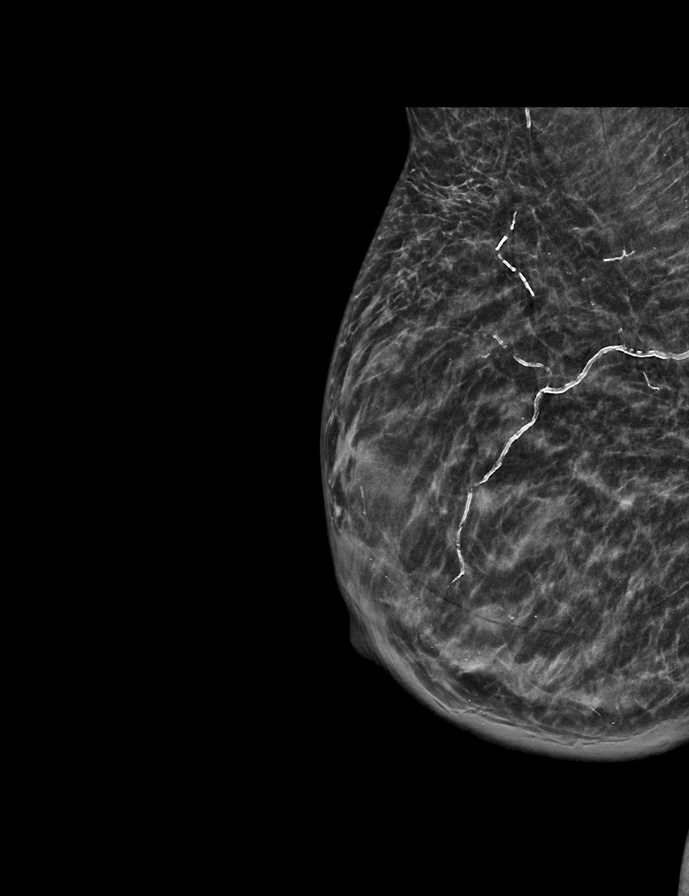

[L CC synth-2D]
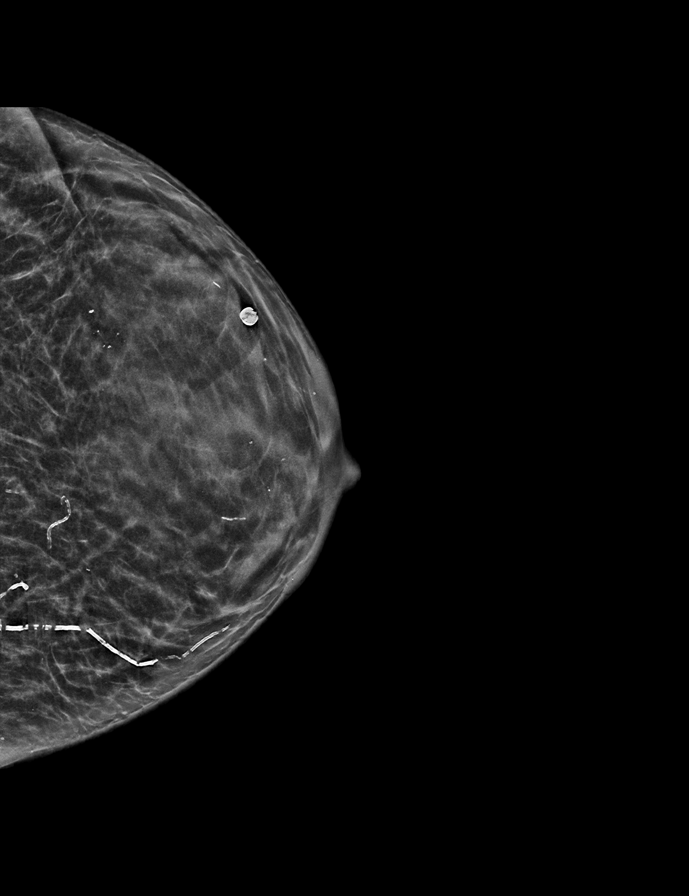

[L MLO synth-2D]
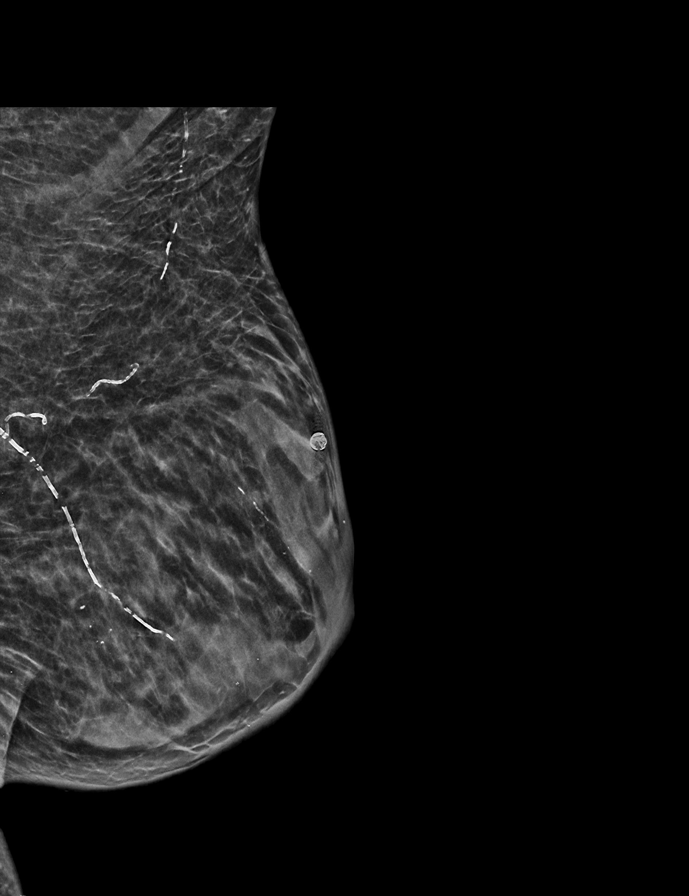

[R CC synth-2D]
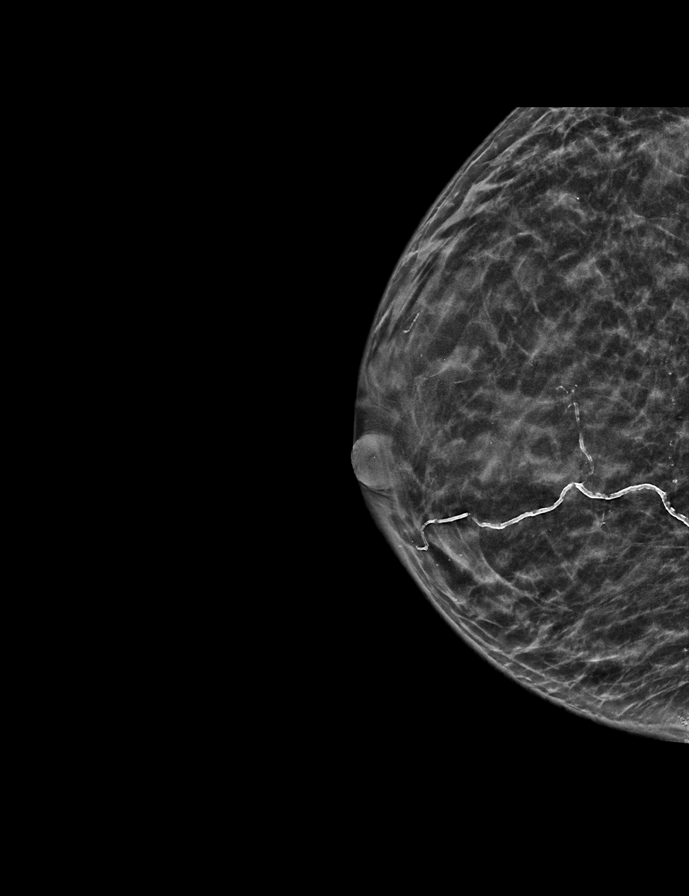

[R MLO tomo · 2 of 53 frames shown]
[frame 18/53]
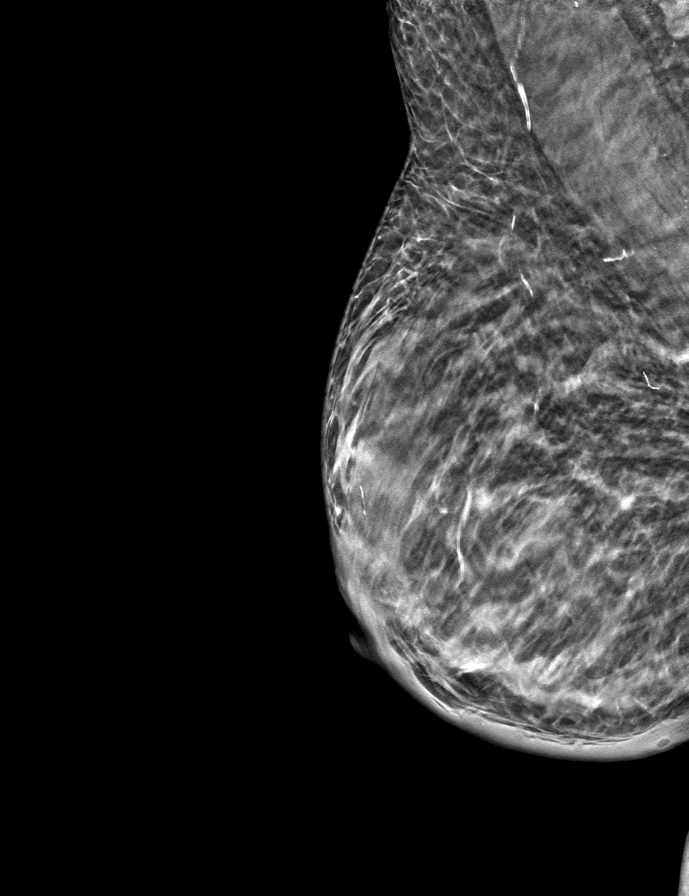
[frame 27/53]
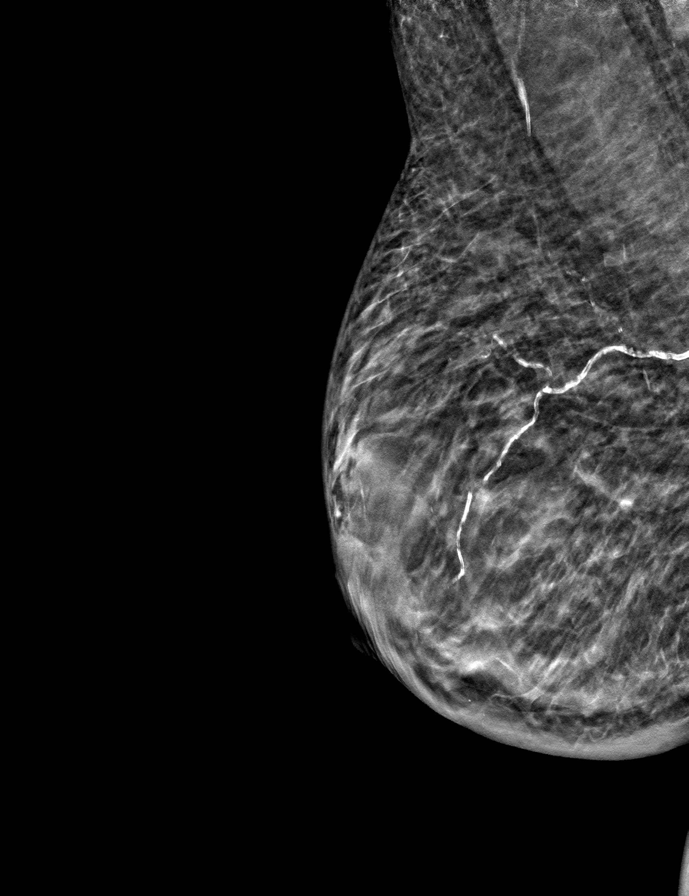

[R CC tomo · tomo slice 25/49.0]
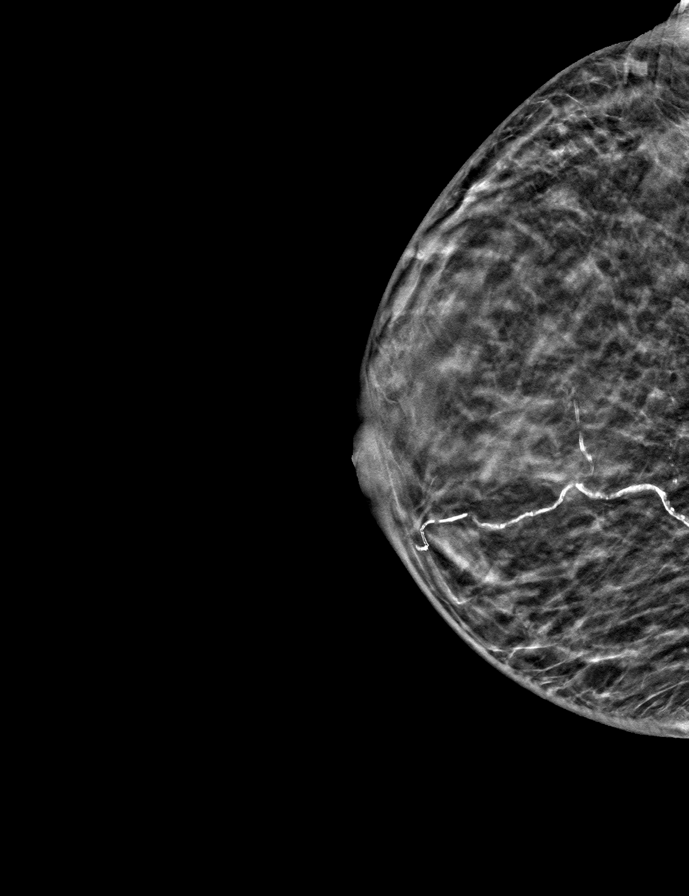

[L CC tomo · tomo slice 25/49.0]
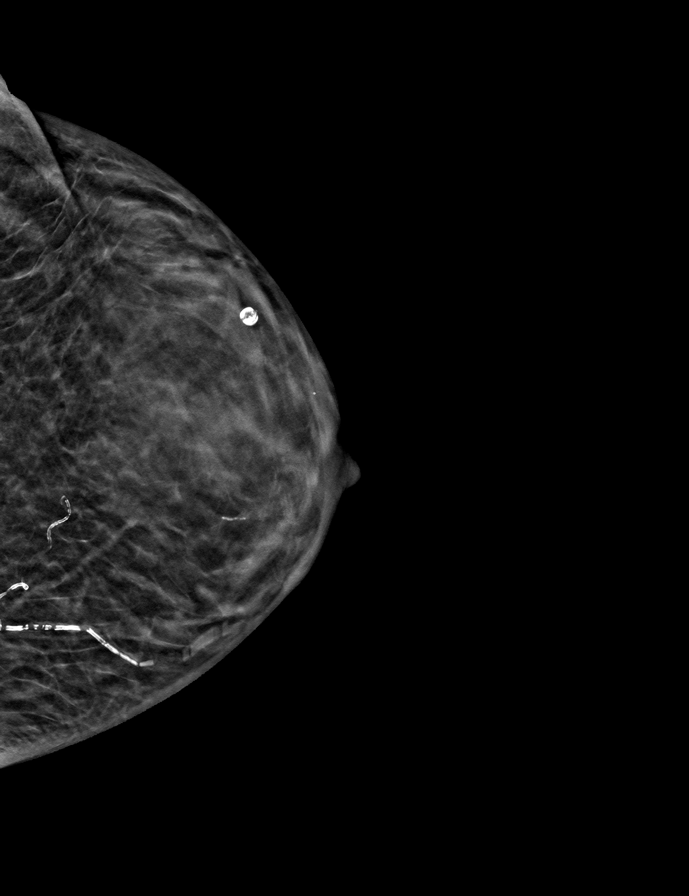

[L MLO tomo · tomo slice 26/51.0]
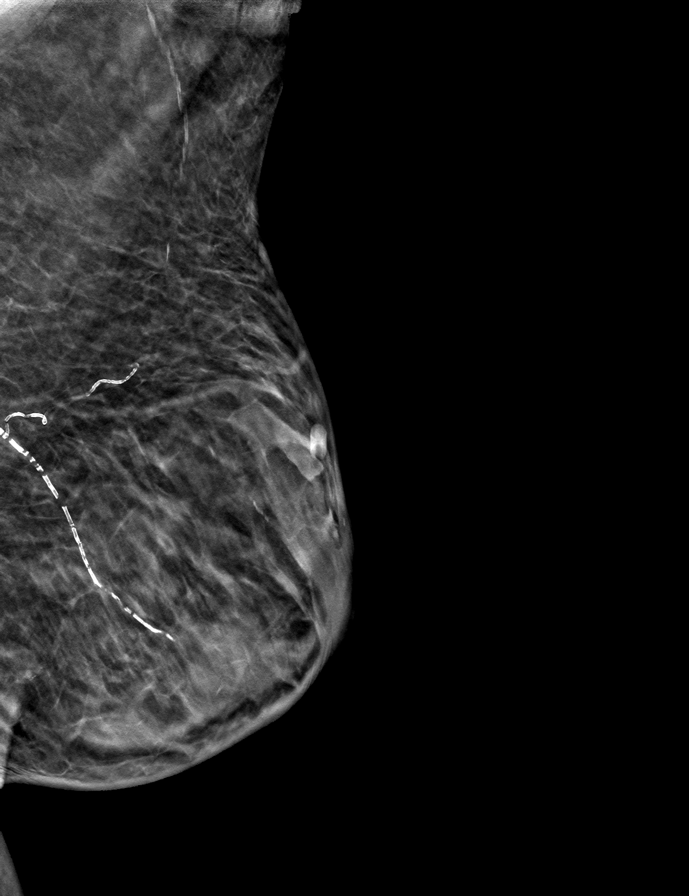

[9 of 24 positions shown; findings below may reference images not displayed]

ACR Breast Density Category c: The breast tissue is heterogeneously
dense, which may obscure small masses.
FINDINGS: There are no findings suspicious for malignancy.
IMPRESSION: No mammographic evidence of malignancy. A result letter of this
screening mammogram will be mailed directly to the patient.

RECOMMENDATION:
Screening mammogram in one year. (Code:Q3-W-BC3)

BI-RADS CATEGORY  1: Negative.
